# Patient Record
Sex: Female | Born: 1970 | Race: White | Hispanic: No | Smoking: Current every day smoker
Health system: Southern US, Community
[De-identification: ages and names within clinical notes are randomized; demographics above are authoritative.]

## PROBLEM LIST (undated history)

## (undated) ENCOUNTER — Emergency Department (HOSPITAL_COMMUNITY): Discharge status: Against Medical Advice | Payer: Medicaid Other

## (undated) DIAGNOSIS — K589 Irritable bowel syndrome without diarrhea: Secondary | ICD-10-CM

## (undated) DIAGNOSIS — M359 Systemic involvement of connective tissue, unspecified: Secondary | ICD-10-CM

## (undated) DIAGNOSIS — G8929 Other chronic pain: Secondary | ICD-10-CM

## (undated) DIAGNOSIS — I471 Supraventricular tachycardia, unspecified: Secondary | ICD-10-CM

## (undated) DIAGNOSIS — G40909 Epilepsy, unspecified, not intractable, without status epilepticus: Secondary | ICD-10-CM

## (undated) DIAGNOSIS — I509 Heart failure, unspecified: Secondary | ICD-10-CM

## (undated) DIAGNOSIS — F419 Anxiety disorder, unspecified: Secondary | ICD-10-CM

## (undated) DIAGNOSIS — E119 Type 2 diabetes mellitus without complications: Secondary | ICD-10-CM

## (undated) DIAGNOSIS — R079 Chest pain, unspecified: Secondary | ICD-10-CM

## (undated) DIAGNOSIS — F4310 Post-traumatic stress disorder, unspecified: Secondary | ICD-10-CM

## (undated) DIAGNOSIS — I10 Essential (primary) hypertension: Secondary | ICD-10-CM

## (undated) DIAGNOSIS — Z720 Tobacco use: Secondary | ICD-10-CM

## (undated) DIAGNOSIS — J449 Chronic obstructive pulmonary disease, unspecified: Secondary | ICD-10-CM

## (undated) DIAGNOSIS — Z9889 Other specified postprocedural states: Secondary | ICD-10-CM

## (undated) DIAGNOSIS — M545 Low back pain, unspecified: Secondary | ICD-10-CM

## (undated) DIAGNOSIS — R739 Hyperglycemia, unspecified: Secondary | ICD-10-CM

## (undated) DIAGNOSIS — R002 Palpitations: Secondary | ICD-10-CM

## (undated) DIAGNOSIS — R112 Nausea with vomiting, unspecified: Secondary | ICD-10-CM

## (undated) DIAGNOSIS — Z87828 Personal history of other (healed) physical injury and trauma: Secondary | ICD-10-CM

## (undated) DIAGNOSIS — J45909 Unspecified asthma, uncomplicated: Secondary | ICD-10-CM

## (undated) DIAGNOSIS — S0990XA Unspecified injury of head, initial encounter: Secondary | ICD-10-CM

## (undated) DIAGNOSIS — K5090 Crohn's disease, unspecified, without complications: Secondary | ICD-10-CM

## (undated) DIAGNOSIS — K219 Gastro-esophageal reflux disease without esophagitis: Secondary | ICD-10-CM

## (undated) HISTORY — DX: Type 2 diabetes mellitus without complications: E11.9

## (undated) HISTORY — DX: Irritable bowel syndrome, unspecified: K58.9

## (undated) HISTORY — DX: Low back pain, unspecified: M54.50

## (undated) HISTORY — DX: Gastro-esophageal reflux disease without esophagitis: K21.9

## (undated) HISTORY — DX: Chest pain, unspecified: R07.9

## (undated) HISTORY — DX: Supraventricular tachycardia, unspecified: I47.10

## (undated) HISTORY — PX: OOPHORECTOMY: SHX86

## (undated) HISTORY — DX: Anxiety disorder, unspecified: F41.9

## (undated) HISTORY — DX: Epilepsy, unspecified, not intractable, without status epilepticus: G40.909

## (undated) HISTORY — DX: Hyperglycemia, unspecified: R73.9

## (undated) HISTORY — DX: Crohn's disease, unspecified, without complications: K50.90

## (undated) HISTORY — PX: CHOLECYSTECTOMY: SHX55

## (undated) HISTORY — DX: Tobacco use: Z72.0

## (undated) HISTORY — DX: Low back pain: M54.5

## (undated) HISTORY — PX: ABDOMINAL HYSTERECTOMY: SHX81

## (undated) HISTORY — DX: Unspecified injury of head, initial encounter: S09.90XA

## (undated) HISTORY — DX: Palpitations: R00.2

## (undated) HISTORY — DX: Personal history of other (healed) physical injury and trauma: Z87.828

## (undated) HISTORY — DX: Low back pain, unspecified: G89.29

## (undated) HISTORY — DX: Chronic obstructive pulmonary disease, unspecified: J44.9

## (undated) HISTORY — DX: Supraventricular tachycardia: I47.1

---

## 2000-09-14 ENCOUNTER — Encounter: Admission: RE | Admit: 2000-09-14 | Discharge: 2000-09-14 | Payer: Self-pay | Admitting: Internal Medicine

## 2000-09-14 ENCOUNTER — Encounter: Payer: Self-pay | Admitting: Internal Medicine

## 2002-10-26 ENCOUNTER — Emergency Department (HOSPITAL_COMMUNITY): Admission: EM | Admit: 2002-10-26 | Discharge: 2002-10-26 | Payer: Self-pay | Admitting: Emergency Medicine

## 2002-12-24 ENCOUNTER — Emergency Department (HOSPITAL_COMMUNITY): Admission: EM | Admit: 2002-12-24 | Discharge: 2002-12-24 | Payer: Self-pay | Admitting: Emergency Medicine

## 2004-05-03 HISTORY — PX: APPENDECTOMY: SHX54

## 2004-05-25 ENCOUNTER — Emergency Department (HOSPITAL_COMMUNITY): Admission: EM | Admit: 2004-05-25 | Discharge: 2004-05-25 | Payer: Self-pay | Admitting: Emergency Medicine

## 2004-05-28 ENCOUNTER — Ambulatory Visit (HOSPITAL_COMMUNITY): Admission: RE | Admit: 2004-05-28 | Discharge: 2004-05-28 | Payer: Self-pay | Admitting: Pulmonary Disease

## 2004-06-22 ENCOUNTER — Ambulatory Visit (HOSPITAL_COMMUNITY): Admission: RE | Admit: 2004-06-22 | Discharge: 2004-06-22 | Payer: Self-pay | Admitting: Pulmonary Disease

## 2004-06-26 ENCOUNTER — Ambulatory Visit (HOSPITAL_COMMUNITY): Admission: RE | Admit: 2004-06-26 | Discharge: 2004-06-26 | Payer: Self-pay | Admitting: Pulmonary Disease

## 2004-08-11 ENCOUNTER — Emergency Department (HOSPITAL_COMMUNITY): Admission: EM | Admit: 2004-08-11 | Discharge: 2004-08-11 | Payer: Self-pay | Admitting: Emergency Medicine

## 2004-09-07 ENCOUNTER — Emergency Department (HOSPITAL_COMMUNITY): Admission: EM | Admit: 2004-09-07 | Discharge: 2004-09-07 | Payer: Self-pay | Admitting: Emergency Medicine

## 2004-09-16 ENCOUNTER — Inpatient Hospital Stay (HOSPITAL_COMMUNITY): Admission: AD | Admit: 2004-09-16 | Discharge: 2004-09-16 | Payer: Self-pay | Admitting: Obstetrics and Gynecology

## 2004-09-17 ENCOUNTER — Inpatient Hospital Stay (HOSPITAL_COMMUNITY): Admission: RE | Admit: 2004-09-17 | Discharge: 2004-09-18 | Payer: Self-pay | Admitting: Obstetrics & Gynecology

## 2004-09-17 ENCOUNTER — Encounter (INDEPENDENT_AMBULATORY_CARE_PROVIDER_SITE_OTHER): Payer: Self-pay | Admitting: *Deleted

## 2004-10-13 ENCOUNTER — Emergency Department (HOSPITAL_COMMUNITY): Admission: EM | Admit: 2004-10-13 | Discharge: 2004-10-13 | Payer: Self-pay | Admitting: Emergency Medicine

## 2004-11-02 ENCOUNTER — Emergency Department (HOSPITAL_COMMUNITY): Admission: EM | Admit: 2004-11-02 | Discharge: 2004-11-02 | Payer: Self-pay | Admitting: *Deleted

## 2004-11-14 ENCOUNTER — Emergency Department (HOSPITAL_COMMUNITY): Admission: EM | Admit: 2004-11-14 | Discharge: 2004-11-15 | Payer: Self-pay | Admitting: *Deleted

## 2004-11-26 ENCOUNTER — Encounter: Payer: Self-pay | Admitting: Obstetrics and Gynecology

## 2004-11-26 ENCOUNTER — Inpatient Hospital Stay (HOSPITAL_COMMUNITY): Admission: RE | Admit: 2004-11-26 | Discharge: 2004-11-28 | Payer: Self-pay | Admitting: Obstetrics and Gynecology

## 2004-12-22 ENCOUNTER — Emergency Department (HOSPITAL_COMMUNITY): Admission: EM | Admit: 2004-12-22 | Discharge: 2004-12-22 | Payer: Self-pay | Admitting: Emergency Medicine

## 2004-12-29 ENCOUNTER — Ambulatory Visit (HOSPITAL_COMMUNITY): Admission: RE | Admit: 2004-12-29 | Discharge: 2004-12-29 | Payer: Self-pay | Admitting: Obstetrics and Gynecology

## 2005-10-07 ENCOUNTER — Inpatient Hospital Stay (HOSPITAL_COMMUNITY): Admission: EM | Admit: 2005-10-07 | Discharge: 2005-10-09 | Payer: Self-pay | Admitting: Emergency Medicine

## 2005-10-08 ENCOUNTER — Ambulatory Visit: Payer: Self-pay | Admitting: Internal Medicine

## 2005-10-26 ENCOUNTER — Emergency Department (HOSPITAL_COMMUNITY): Admission: EM | Admit: 2005-10-26 | Discharge: 2005-10-26 | Payer: Self-pay | Admitting: *Deleted

## 2005-11-02 ENCOUNTER — Ambulatory Visit: Payer: Self-pay | Admitting: Gastroenterology

## 2005-11-11 ENCOUNTER — Ambulatory Visit: Payer: Self-pay | Admitting: Internal Medicine

## 2005-11-11 ENCOUNTER — Ambulatory Visit (HOSPITAL_COMMUNITY): Admission: RE | Admit: 2005-11-11 | Discharge: 2005-11-11 | Payer: Self-pay | Admitting: Internal Medicine

## 2006-01-14 ENCOUNTER — Ambulatory Visit: Payer: Self-pay | Admitting: Internal Medicine

## 2006-02-14 ENCOUNTER — Ambulatory Visit: Payer: Self-pay | Admitting: Internal Medicine

## 2006-03-19 ENCOUNTER — Emergency Department (HOSPITAL_COMMUNITY): Admission: EM | Admit: 2006-03-19 | Discharge: 2006-03-19 | Payer: Self-pay | Admitting: Emergency Medicine

## 2006-03-21 ENCOUNTER — Ambulatory Visit: Payer: Self-pay | Admitting: Internal Medicine

## 2006-04-04 ENCOUNTER — Ambulatory Visit (HOSPITAL_COMMUNITY): Admission: RE | Admit: 2006-04-04 | Discharge: 2006-04-04 | Payer: Self-pay | Admitting: Orthopaedic Surgery

## 2006-05-05 ENCOUNTER — Ambulatory Visit: Payer: Self-pay | Admitting: Internal Medicine

## 2006-05-16 ENCOUNTER — Emergency Department (HOSPITAL_COMMUNITY): Admission: EM | Admit: 2006-05-16 | Discharge: 2006-05-17 | Payer: Self-pay | Admitting: Emergency Medicine

## 2006-05-24 ENCOUNTER — Emergency Department (HOSPITAL_COMMUNITY): Admission: EM | Admit: 2006-05-24 | Discharge: 2006-05-24 | Payer: Self-pay | Admitting: Emergency Medicine

## 2006-06-08 ENCOUNTER — Emergency Department (HOSPITAL_COMMUNITY): Admission: EM | Admit: 2006-06-08 | Discharge: 2006-06-08 | Payer: Self-pay | Admitting: Emergency Medicine

## 2006-06-15 ENCOUNTER — Ambulatory Visit (HOSPITAL_COMMUNITY): Admission: RE | Admit: 2006-06-15 | Discharge: 2006-06-15 | Payer: Self-pay | Admitting: Pulmonary Disease

## 2006-06-19 ENCOUNTER — Emergency Department (HOSPITAL_COMMUNITY): Admission: EM | Admit: 2006-06-19 | Discharge: 2006-06-19 | Payer: Self-pay | Admitting: Emergency Medicine

## 2006-06-20 ENCOUNTER — Ambulatory Visit: Payer: Self-pay | Admitting: Internal Medicine

## 2006-07-04 ENCOUNTER — Encounter (HOSPITAL_COMMUNITY): Admission: RE | Admit: 2006-07-04 | Discharge: 2006-08-03 | Payer: Self-pay | Admitting: Pulmonary Disease

## 2006-09-19 ENCOUNTER — Ambulatory Visit (HOSPITAL_COMMUNITY): Admission: RE | Admit: 2006-09-19 | Discharge: 2006-09-19 | Payer: Self-pay | Admitting: Unknown Physician Specialty

## 2006-09-21 ENCOUNTER — Emergency Department (HOSPITAL_COMMUNITY): Admission: EM | Admit: 2006-09-21 | Discharge: 2006-09-21 | Payer: Self-pay | Admitting: Emergency Medicine

## 2006-10-25 ENCOUNTER — Ambulatory Visit: Payer: Self-pay | Admitting: Internal Medicine

## 2006-11-29 ENCOUNTER — Ambulatory Visit: Payer: Self-pay | Admitting: Internal Medicine

## 2006-12-29 ENCOUNTER — Ambulatory Visit: Payer: Self-pay | Admitting: Internal Medicine

## 2007-01-09 ENCOUNTER — Ambulatory Visit: Payer: Self-pay | Admitting: Internal Medicine

## 2007-01-09 ENCOUNTER — Ambulatory Visit (HOSPITAL_COMMUNITY): Admission: RE | Admit: 2007-01-09 | Discharge: 2007-01-09 | Payer: Self-pay | Admitting: Internal Medicine

## 2007-01-10 ENCOUNTER — Ambulatory Visit (HOSPITAL_COMMUNITY): Admission: RE | Admit: 2007-01-10 | Discharge: 2007-01-10 | Payer: Self-pay | Admitting: Internal Medicine

## 2007-03-10 ENCOUNTER — Encounter (HOSPITAL_COMMUNITY): Admission: RE | Admit: 2007-03-10 | Discharge: 2007-04-09 | Payer: Self-pay | Admitting: Internal Medicine

## 2007-03-17 ENCOUNTER — Ambulatory Visit (HOSPITAL_COMMUNITY): Admission: RE | Admit: 2007-03-17 | Discharge: 2007-03-17 | Payer: Self-pay | Admitting: General Surgery

## 2007-03-17 ENCOUNTER — Encounter (INDEPENDENT_AMBULATORY_CARE_PROVIDER_SITE_OTHER): Payer: Self-pay | Admitting: General Surgery

## 2007-05-09 ENCOUNTER — Emergency Department (HOSPITAL_COMMUNITY): Admission: EM | Admit: 2007-05-09 | Discharge: 2007-05-09 | Payer: Self-pay | Admitting: Emergency Medicine

## 2007-05-24 ENCOUNTER — Ambulatory Visit: Payer: Self-pay | Admitting: Cardiology

## 2007-05-30 ENCOUNTER — Ambulatory Visit (HOSPITAL_COMMUNITY): Admission: RE | Admit: 2007-05-30 | Discharge: 2007-05-30 | Payer: Self-pay | Admitting: Cardiology

## 2007-07-04 ENCOUNTER — Emergency Department (HOSPITAL_COMMUNITY): Admission: EM | Admit: 2007-07-04 | Discharge: 2007-07-04 | Payer: Self-pay | Admitting: Emergency Medicine

## 2007-11-27 ENCOUNTER — Emergency Department (HOSPITAL_COMMUNITY): Admission: EM | Admit: 2007-11-27 | Discharge: 2007-11-27 | Payer: Self-pay | Admitting: Emergency Medicine

## 2008-01-17 ENCOUNTER — Emergency Department (HOSPITAL_COMMUNITY): Admission: EM | Admit: 2008-01-17 | Discharge: 2008-01-17 | Payer: Self-pay | Admitting: Emergency Medicine

## 2008-02-08 ENCOUNTER — Ambulatory Visit (HOSPITAL_COMMUNITY): Admission: RE | Admit: 2008-02-08 | Discharge: 2008-02-08 | Payer: Self-pay | Admitting: Pulmonary Disease

## 2008-03-13 ENCOUNTER — Ambulatory Visit (HOSPITAL_COMMUNITY): Admission: RE | Admit: 2008-03-13 | Discharge: 2008-03-13 | Payer: Self-pay | Admitting: Pulmonary Disease

## 2008-05-03 HISTORY — PX: COLONOSCOPY: SHX174

## 2008-05-07 ENCOUNTER — Encounter (HOSPITAL_COMMUNITY): Admission: RE | Admit: 2008-05-07 | Discharge: 2008-06-06 | Payer: Self-pay | Admitting: Neurosurgery

## 2008-05-13 ENCOUNTER — Ambulatory Visit: Payer: Self-pay | Admitting: Internal Medicine

## 2008-05-24 ENCOUNTER — Ambulatory Visit: Payer: Self-pay | Admitting: Internal Medicine

## 2008-05-24 ENCOUNTER — Ambulatory Visit (HOSPITAL_COMMUNITY): Admission: RE | Admit: 2008-05-24 | Discharge: 2008-05-24 | Payer: Self-pay | Admitting: Internal Medicine

## 2008-06-17 ENCOUNTER — Ambulatory Visit (HOSPITAL_COMMUNITY): Admission: RE | Admit: 2008-06-17 | Discharge: 2008-06-17 | Payer: Self-pay | Admitting: Internal Medicine

## 2008-06-17 ENCOUNTER — Ambulatory Visit: Payer: Self-pay | Admitting: Internal Medicine

## 2008-06-17 ENCOUNTER — Encounter: Payer: Self-pay | Admitting: Gastroenterology

## 2008-06-17 LAB — CONVERTED CEMR LAB
AST: 17 units/L (ref 0–37)
Albumin: 4.4 g/dL (ref 3.5–5.2)
BUN: 9 mg/dL (ref 6–23)
Basophils Relative: 0 % (ref 0–1)
Calcium: 10.2 mg/dL (ref 8.4–10.5)
Chloride: 101 meq/L (ref 96–112)
Glucose, Bld: 95 mg/dL (ref 70–99)
Lymphs Abs: 1.6 10*3/uL (ref 0.7–4.0)
Monocytes Relative: 4 % (ref 3–12)
Neutro Abs: 6 10*3/uL (ref 1.7–7.7)
Neutrophils Relative %: 75 % (ref 43–77)
Platelets: 284 10*3/uL (ref 150–400)
Potassium: 3.9 meq/L (ref 3.5–5.3)
RBC: 4.53 M/uL (ref 3.87–5.11)
Sodium: 141 meq/L (ref 135–145)
Total Protein: 7 g/dL (ref 6.0–8.3)
WBC: 8 10*3/uL (ref 4.0–10.5)

## 2008-06-20 ENCOUNTER — Telehealth: Payer: Self-pay | Admitting: Gastroenterology

## 2008-06-21 LAB — CONVERTED CEMR LAB
Ketones, ur: NEGATIVE mg/dL
Protein, ur: NEGATIVE mg/dL
Urine Glucose: NEGATIVE mg/dL
pH: 6 (ref 5.0–8.0)

## 2008-07-03 ENCOUNTER — Ambulatory Visit: Payer: Self-pay | Admitting: Internal Medicine

## 2008-07-03 ENCOUNTER — Encounter: Payer: Self-pay | Admitting: Gastroenterology

## 2008-07-03 DIAGNOSIS — M545 Low back pain: Secondary | ICD-10-CM

## 2008-07-03 DIAGNOSIS — F411 Generalized anxiety disorder: Secondary | ICD-10-CM | POA: Insufficient documentation

## 2008-07-12 ENCOUNTER — Encounter: Payer: Self-pay | Admitting: Gastroenterology

## 2008-07-15 ENCOUNTER — Ambulatory Visit: Payer: Self-pay | Admitting: Cardiology

## 2008-07-15 ENCOUNTER — Encounter (INDEPENDENT_AMBULATORY_CARE_PROVIDER_SITE_OTHER): Payer: Self-pay

## 2008-07-15 LAB — CONVERTED CEMR LAB
Ketones, ur: NEGATIVE mg/dL
Leukocytes, UA: NEGATIVE
Nitrite: NEGATIVE
Specific Gravity, Urine: 1.022 (ref 1.005–1.030)
Urobilinogen, UA: 0.2 (ref 0.0–1.0)
pH: 6 (ref 5.0–8.0)

## 2008-07-18 ENCOUNTER — Encounter: Payer: Self-pay | Admitting: Urgent Care

## 2008-07-20 ENCOUNTER — Emergency Department (HOSPITAL_COMMUNITY): Admission: EM | Admit: 2008-07-20 | Discharge: 2008-07-20 | Payer: Self-pay | Admitting: Emergency Medicine

## 2008-08-27 ENCOUNTER — Ambulatory Visit (HOSPITAL_COMMUNITY): Admission: RE | Admit: 2008-08-27 | Discharge: 2008-08-27 | Payer: Self-pay | Admitting: Pulmonary Disease

## 2008-09-04 ENCOUNTER — Ambulatory Visit (HOSPITAL_COMMUNITY): Admission: RE | Admit: 2008-09-04 | Discharge: 2008-09-04 | Payer: Self-pay | Admitting: Pulmonary Disease

## 2008-09-14 ENCOUNTER — Emergency Department (HOSPITAL_COMMUNITY): Admission: EM | Admit: 2008-09-14 | Discharge: 2008-09-14 | Payer: Self-pay | Admitting: Emergency Medicine

## 2008-10-30 ENCOUNTER — Emergency Department (HOSPITAL_COMMUNITY): Admission: EM | Admit: 2008-10-30 | Discharge: 2008-10-30 | Payer: Self-pay | Admitting: Emergency Medicine

## 2008-11-25 ENCOUNTER — Emergency Department (HOSPITAL_COMMUNITY): Admission: EM | Admit: 2008-11-25 | Discharge: 2008-11-25 | Payer: Self-pay | Admitting: Emergency Medicine

## 2009-02-28 ENCOUNTER — Encounter: Payer: Self-pay | Admitting: Gastroenterology

## 2009-06-28 ENCOUNTER — Emergency Department (HOSPITAL_COMMUNITY): Admission: EM | Admit: 2009-06-28 | Discharge: 2009-06-28 | Payer: Self-pay | Admitting: Emergency Medicine

## 2009-07-29 ENCOUNTER — Emergency Department (HOSPITAL_COMMUNITY): Admission: EM | Admit: 2009-07-29 | Discharge: 2009-07-29 | Payer: Self-pay | Admitting: Emergency Medicine

## 2009-08-07 ENCOUNTER — Ambulatory Visit: Payer: Self-pay | Admitting: Cardiology

## 2009-09-16 ENCOUNTER — Encounter: Payer: Self-pay | Admitting: Cardiology

## 2009-12-16 ENCOUNTER — Ambulatory Visit: Payer: Self-pay | Admitting: Internal Medicine

## 2009-12-16 ENCOUNTER — Encounter: Payer: Self-pay | Admitting: Urgent Care

## 2009-12-24 ENCOUNTER — Encounter: Payer: Self-pay | Admitting: Urgent Care

## 2009-12-24 ENCOUNTER — Encounter: Payer: Self-pay | Admitting: *Deleted

## 2009-12-24 LAB — CONVERTED CEMR LAB
Alkaline Phosphatase: 103 units/L
Basophils Absolute: 0 10*3/uL
CO2: 29 meq/L
Creatinine, Ser: 0.85 mg/dL
Eosinophils Absolute: 0.2 10*3/uL
Eosinophils Relative: 2 %
HCT: 47.1 %
Hemoglobin: 15.4 g/dL
Monocytes Absolute: 0.6 10*3/uL
Platelets: 344 10*3/uL
RBC: 4.98 M/uL
Total Protein: 7.3 g/dL
WBC: 10.5 10*3/uL

## 2010-01-09 ENCOUNTER — Encounter: Payer: Self-pay | Admitting: Internal Medicine

## 2010-01-23 ENCOUNTER — Encounter: Payer: Self-pay | Admitting: Internal Medicine

## 2010-01-26 LAB — CONVERTED CEMR LAB
AST: 19 units/L (ref 0–37)
Albumin: 4.8 g/dL (ref 3.5–5.2)
BUN: 5 mg/dL — ABNORMAL LOW (ref 6–23)
Basophils Relative: 0 % (ref 0–1)
CO2: 29 meq/L (ref 19–32)
Calcium: 10 mg/dL (ref 8.4–10.5)
Chloride: 99 meq/L (ref 96–112)
Creatinine, Ser: 0.85 mg/dL (ref 0.40–1.20)
Hemoglobin, Urine: NEGATIVE
Hemoglobin: 15.4 g/dL — ABNORMAL HIGH (ref 12.0–15.0)
Ketones, ur: NEGATIVE mg/dL
Lymphocytes Relative: 19 % (ref 12–46)
Lymphs Abs: 2.1 10*3/uL (ref 0.7–4.0)
MCHC: 32.7 g/dL (ref 30.0–36.0)
Monocytes Absolute: 0.6 10*3/uL (ref 0.1–1.0)
Monocytes Relative: 6 % (ref 3–12)
Neutro Abs: 7.7 10*3/uL (ref 1.7–7.7)
Neutrophils Relative %: 73 % (ref 43–77)
Nitrite: NEGATIVE
Potassium: 4.4 meq/L (ref 3.5–5.3)
RBC: 4.98 M/uL (ref 3.87–5.11)
Specific Gravity, Urine: 1.01 (ref 1.005–1.030)
Urobilinogen, UA: 0.2 (ref 0.0–1.0)
WBC: 10.5 10*3/uL (ref 4.0–10.5)
pH: 6 (ref 5.0–8.0)

## 2010-02-25 ENCOUNTER — Encounter: Payer: Self-pay | Admitting: Internal Medicine

## 2010-04-18 ENCOUNTER — Emergency Department (HOSPITAL_COMMUNITY)
Admission: EM | Admit: 2010-04-18 | Discharge: 2010-04-18 | Payer: Self-pay | Source: Home / Self Care | Admitting: Emergency Medicine

## 2010-05-24 ENCOUNTER — Encounter: Payer: Self-pay | Admitting: Internal Medicine

## 2010-06-02 NOTE — Miscellaneous (Signed)
Summary: Orders Update  Clinical Lists Changes  Orders: Added new Test order of T-CBC w/Diff [phone]) - Signed Added new Test order of T-Comprehensive Metabolic Panel ([id number]) - Signed Added new Test order of T-Urinalysis ([id number]) - Signed Added new Test order of T-Culture, C-Diff Toxin A/B ([id number]) - Signed Added new Test order of T-Culture, Stool ([id number]) - Signed

## 2010-06-02 NOTE — Assessment & Plan Note (Signed)
Summary: ov w/RMR,abd pain,n/v,hx crohn's/ss  Patient left the office without being seen.  Visit Type:  Follow-up Visit Primary Care Arieon Corcoran:  Luan Pulling  Chief Complaint:  F/U abd pain/n/v/diarrhea.  History of Present Illness:  Pt says she has diarrhea every day 10-15 times daily. Has nausea at least three times weekly.  Current Medications (verified): 1)  Alprazolam 1 Mg Tbdp (Alprazolam) .... One By Mouth Qid 2)  Flexeril .... Prn 3)  Nexium 40 Mg Cpdr (Esomeprazole Magnesium) .... Take 1 Tablet By Mouth Once A Day 4)  Hyomax-Sl 0.125 Mg Subl (Hyoscyamine Sulfate) .... One By Mouth Ac/hs (Up To Qid) As Needed Diarrhea/abd Pain 5)  Zofran Odt 4 Mg Tbdp (Ondansetron) .Marland Kitchen.. 1 By Mouth Q 6-8 Hrs As Needed N/v  Allergies (verified): 1)  ! Penicillin 2)  ! Codeine 3)  ! * Latex 4)  Prilosec  Past History:  Past Medical History: Last updated: 07/03/2008 Anxiety Disorder GERD Irritable Bowel Syndrome Chronic low back pain H/o colon polyps in 2002 at [facility] per patient EGD 9/08 by Dr. Gala Romney, small hiatal hernia, s/p 32 french maloney dilator for h/o dysphagia Colonoscopy 2007 by Dr. 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  1/2 ppd Alcohol Use - no Illicit Drug Use - no  Risk Factors: Smoking Status: current (07/03/2008)  Vital Signs:  Patient profile:   40 year old female Height:      66.5 inches Weight:      145 pounds BMI:     23.14 Temp:      97.7 degrees F oral Pulse rate:   60 / minute BP sitting:   118 / 80  (left arm) Cuff size:   regular  Vitals Entered By: Waldon Merl LPN (February 26, 9976 10:03 AM)

## 2010-06-02 NOTE — Procedures (Signed)
Summary: [facility] 08/2009  [facility] 08/2009   Imported By: Nevada Crane 09/16/2009 16:46:27  _____________________________________________________________________  External Attachment:    Type:   Image     Comment:   External Document

## 2010-06-02 NOTE — Assessment & Plan Note (Signed)
Summary: abd pain,hx of polyps/ss   Visit Type:  Follow-up Visit Primary Care Evren Shankland:  Luan Pulling  Chief Complaint:  abdominal pain.  History of Present Illness: Pt here for abd pain, diarrhea.  Hx IBS.  Diarrhea 10-15 times per day w/ abd pain right lower quad & swelling.  10/10 pain intermittant.  c/o dysuria.  c/o nausea/vomiting all day.  Last emesis yesterday.  c/o small volume dark hematochezia.  Denies fever or chills.  c/o "hot feeling in face & feels like BP may be up".  Changed from Librax to hyomax last fall due to insurance coverage.  No ill contacts, foreign travel, new pets.  Well water.  No recent abx or new meds.  c/o "nerves"  going through divorce right now.  Does not think this is her usual IBS.  Questions whether she needs CT.  Has had benign CT A/P anually for similar symptoms per review of medical records.  States she needs colonoscopies q 6 months given FH of CRCA and adenomatous polyp found at [facility] several yrs ago.    Current Problems (verified): 1)  Dysuria  (ICD-788.1) 2)  Diarrhea, Bloody  (ICD-787.91) 3)  Abdominal Pain  (ICD-789.00) 4)  Anxiety State, Unspecified  (ICD-300.00) 5)  Gastroesophageal Reflux Disease, Chronic  (ICD-530.81) 6)  Back Pain, Lumbar, Chronic  (ICD-724.2) 7)  Uti's, Hx of  (ICD-V13.00) 8)  Hx of Microscopic Hematuria  (ICD-599.72) 9)  Irritable Bowel Syndrome  (ICD-564.1)  Current Medications (verified): 1)  Alprazolam 1 Mg Tbdp (Alprazolam) .... One By Mouth Qid 2)  Flexeril .... Prn 3)  Nexium 40 Mg Cpdr (Esomeprazole Magnesium) .... One By Mouth Bid 4)  Hyomax-Sl 0.125 Mg Subl (Hyoscyamine Sulfate) .... One By Mouth Ac/hs (Up To Qid) As Needed Diarrhea/abd Pain 5)  Librax 2.5-5 Mg Caps (Clidinium-Chlordiazepoxide) .Marland Kitchen.. 1 By Mouth Ac/hs (Up To Qid) For Abd Pain/diarrhea 6)  Anusol-Hc 25 Mg Supp (Hydrocortisone Acetate) .Marland Kitchen.. 1 Pr Two Times A Day X 10 Days 7)  Zofran Odt 4 Mg Tbdp (Ondansetron) .Marland Kitchen.. 1 By Mouth Q 6-8 Hrs As Needed  N/v  Allergies (verified): 1)  ! Penicillin 2)  ! Codeine 3)  ! * Latex 4)  Prilosec  Family History: Reviewed history from 07/03/2008 and no changes required. 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  1/2 ppd Alcohol Use - no Illicit Drug Use - no Drug Use:  no  Review of Systems General:  See HPI; Complains of fatigue, weakness, and malaise. CV:  Denies chest pains, angina, palpitations, syncope, dyspnea on exertion, orthopnea, PND, peripheral edema, and claudication. Resp:  Denies dyspnea at rest, dyspnea with exercise, cough, sputum, wheezing, coughing up blood, and pleurisy. GI:  See HPI; Denies jaundice. GU:  Complains of urinary frequency. Derm:  Denies rash, itching, dry skin, hives, moles, warts, and unhealing ulcers.  Vital Signs:  Patient profile:   40 year old female Height:      66.5 inches Weight:      141.50 pounds BMI:     22.58 Temp:     98.2 degrees F oral Pulse rate:   72 / minute BP sitting:   118 / 82  (left arm) Cuff size:   regular  Vitals Entered By: Waldon Merl LPN (December 16, 1636 11:05 AM)  Physical Exam  General:  Well developed, well nourished, no acute distress. Head:  Normocephalic and atraumatic. Eyes:  Sclera clear, non-icteric Mouth:  OP moist, pink Neck:  Supple; no masses or thyromegaly. Heart:  Regular rate and rhythm; no murmurs, rubs,  or bruits. Abdomen:  normal bowel sounds, without guarding, without rebound, no hernia, no masses, and no hepatomegally or splenomegaly.  Moderate tenderness lower abd & around umbilicus on palpation. Msk:  Symmetrical with no gross deformities. Normal posture. Extremities:  No clubbing, cyanosis, edema or deformities noted. Neurologic:  Alert and   oriented x4;  grossly normal neurologically. Skin:  Intact without significant lesions or rashes. Psych:  Alert, oriented.  Impression & Recommendations:  Problem # 1:  ABDOMINAL PAIN (ICD-789.00) 40 y/o caucasian female w/ hx IBS and benign anorectal bleeding who presents w/ abd pain, nausea, vomiting and profuse bloody diarrhea.  No hx inflammatory bowel disease.  Extensive previous work-up benign.  I suspect flare of Irritable bowel syndrome w/ benign anorectal bleeding, but do need to r/o infectious colitis and c-difficile.  She had previously done well on Librax & I recommended she return to this.     UA, CBC, CMP, c diff stool, C/S stool,   Problem # 2:  DIARRHEA, BLOODY (ICD-787.91) See #1  Problem # 3:  GASTROESOPHAGEAL REFLUX DISEASE, CHRONIC (ICD-530.81) Stable  Orders: Est. Patient Level III (70962)  Problem # 4:  DYSURIA (ICD-788.1) Hx frequent UTIs.  Will recheck UA.  Other Orders: T-CBC w/Diff [id number]) T-Comprehensive Metabolic Panel ([id number]) T-Urinalysis [id number]) T-Culture, C-Diff Toxin A/B ([id number]) T-Culture, Stool ([id number])  Patient Instructions: 1)  BEGIN RESTORA ONCE DAILY SAMPLES GIVEN 2)  BEGIN ANUSOL SUPP FOR BLEEDING 3)  BEGIN ZOFRAN AS NEEDED FOR NAUSEA 4)  TO ER IF SEVERE PAIN OR YOU BECOME DEHYDRATED 5)  GO GET LABS NOW  Prescriptions: ZOFRAN ODT 4 MG TBDP (ONDANSETRON) 1 by mouth Q 6-8 HRS as needed N/V  #30 x 0   Entered and Authorized by:   Andria Meuse FNP-BC   Signed by:   Andria Meuse FNP-BC on 12/16/2009   Method used:   Electronically to        [facility] (retail)       [address]       [place], [place]  [address]       Ph: [phone]       Fax: [phone]   RxID:   [id number] ANUSOL-HC 25 MG SUPP (HYDROCORTISONE ACETATE) 1 PR two times a day X 10 DAYS  #20 x 0   Entered and Authorized by:   Andria Meuse FNP-BC   Signed by:   Andria Meuse  FNP-BC on 12/16/2009   Method used:   Electronically to        [facility] (retail)       [address]       [place], [place]  [address]       Ph: [phone]       Fax: [phone]   RxID:   [id number] LIBRAX 2.5-5 MG CAPS (CLIDINIUM-CHLORDIAZEPOXIDE) 1 by mouth ac/hs (up to QID) for abd pain/diarrhea  #90 x 0   Entered and Authorized by:   Andria Meuse FNP-BC   Signed by:   Andria Meuse FNP-BC on 12/16/2009   Method used:   Electronically to        [facility] (retail)  [place], [place]  [address]       Ph: [phone]       Fax: [phone]   RxID:   [id number]   Appended Document: abd pain,hx of polyps/ss Did pt get labs done?  Appended Document: abd pain,hx of polyps/ss Pt not done labs/stool tests yet. Said she hasn't had a BM since she was here, no longer diarrhea. I told her she could go and get the blood work done,  even if she has to wait for the stool tests and  she said she would.  Please advise!  Appended Document: abd pain,hx of polyps/ss Agree

## 2010-06-02 NOTE — Letter (Signed)
Summary: [facility]ERMIN[facility]  DISABILITY DETERMINATION   Imported By: Hoy Morn 01/23/2010 12:11:38  _____________________________________________________________________  External Attachment:    Type:   Image     Comment:   External Document

## 2010-06-02 NOTE — Letter (Signed)
Summary: DISABILITY DETERMINATION  DISABILITY DETERMINATION   Imported By: Hoy Morn 01/09/2010 14:11:03  _____________________________________________________________________  External Attachment:    Type:   Image     Comment:   External Document

## 2010-07-02 ENCOUNTER — Encounter: Payer: Self-pay | Admitting: Cardiology

## 2010-07-02 LAB — CONVERTED CEMR LAB
Albumin: 4.1 g/dL
CO2: 24 meq/L
Glucose, Bld: 64 mg/dL
Hemoglobin: 13.7 g/dL
Potassium: 4.4 meq/L
Sodium: 141 meq/L
Total Protein: 6.7 g/dL
WBC: 7.4 10*3/uL

## 2010-07-07 ENCOUNTER — Ambulatory Visit: Payer: Self-pay | Admitting: Cardiology

## 2010-07-07 ENCOUNTER — Encounter (INDEPENDENT_AMBULATORY_CARE_PROVIDER_SITE_OTHER): Payer: Self-pay | Admitting: *Deleted

## 2010-07-08 ENCOUNTER — Encounter: Payer: Self-pay | Admitting: Cardiology

## 2010-07-13 ENCOUNTER — Ambulatory Visit (INDEPENDENT_AMBULATORY_CARE_PROVIDER_SITE_OTHER): Payer: Medicaid Other | Admitting: Cardiology

## 2010-07-13 ENCOUNTER — Encounter: Payer: Self-pay | Admitting: *Deleted

## 2010-07-13 ENCOUNTER — Encounter: Payer: Self-pay | Admitting: Cardiology

## 2010-07-13 DIAGNOSIS — R002 Palpitations: Secondary | ICD-10-CM

## 2010-07-13 DIAGNOSIS — F17200 Nicotine dependence, unspecified, uncomplicated: Secondary | ICD-10-CM | POA: Insufficient documentation

## 2010-07-13 DIAGNOSIS — R079 Chest pain, unspecified: Secondary | ICD-10-CM

## 2010-07-13 DIAGNOSIS — R0781 Pleurodynia: Secondary | ICD-10-CM | POA: Insufficient documentation

## 2010-07-13 LAB — BASIC METABOLIC PANEL
CO2: 27 mEq/L (ref 19–32)
Chloride: 103 mEq/L (ref 96–112)
GFR calc Af Amer: >60 mL/min (ref >60)
Potassium: 3.7 mEq/L (ref 3.5–5.1)

## 2010-07-13 LAB — DIFFERENTIAL
Eosinophils Relative: 2 % (ref 0–5)
Lymphocytes Relative: 25 % (ref 12–46)
Lymphs Abs: 2.1 10*3/uL (ref 0.7–4.0)
Monocytes Absolute: 0.4 10*3/uL (ref 0.1–1.0)
Monocytes Relative: 5 % (ref 3–12)

## 2010-07-13 LAB — CBC
HCT: 40.1 % (ref 36.0–46.0)
Hemoglobin: 14.3 g/dL (ref 12.0–15.0)
MCH: 31.7 pg (ref 26.0–34.0)
MCV: 88.9 fL (ref 78.0–100.0)
RBC: 4.51 MIL/uL (ref 3.87–5.11)
WBC: 8.4 10*3/uL (ref 4.0–10.5)

## 2010-07-13 LAB — POCT CARDIAC MARKERS: Troponin i, poc: <0.05 ng/mL (ref 0.00–0.09)

## 2010-07-14 NOTE — Letter (Signed)
Summary: Appointment - Missed  [facility] [place]. [address] [place] [address]   Phone: [phone]  Fax: [phone]     July 07, 2010 MRN: [id number]   [facility] [address] [place], [place]  [address]   Dear Ms. CAIN,  Our records indicate you missed your appointment on        07/07/10                with Dr.   Domenic Polite    .                                    It is very important that we reach you to reschedule this appointment. We look forward to participating in your health care needs. Please contact us at the number listed above at your earliest convenience to reschedule this appointment.     Sincerely,    Public relations account executive

## 2010-07-14 NOTE — Letter (Signed)
Summary: DR Luan Pulling OFFICE NOTE  DR Luan Pulling OFFICE NOTE   Imported By: Nevada Crane 07/08/2010 12:00:50  _____________________________________________________________________  External Attachment:    Type:   Image     Comment:   External Document

## 2010-07-21 NOTE — Miscellaneous (Signed)
Summary: labs cbcd,cmp,12/24/2009  Clinical Lists Changes  Observations: Added new observation of CALCIUM: 10.0 mg/dL (12/24/2009 8:11) Added new observation of ALBUMIN: 4.8 g/dL (12/24/2009 8:11) Added new observation of PROTEIN, TOT: 7.3 g/dL (12/24/2009 8:11) Added new observation of SGPT (ALT): 15 units/L (12/24/2009 8:11) Added new observation of SGOT (AST): 19 units/L (12/24/2009 8:11) Added new observation of ALK PHOS: 103 units/L (12/24/2009 8:11) Added new observation of CREATININE: 0.85 mg/dL (12/24/2009 8:11) Added new observation of BUN: 5 mg/dL (12/24/2009 8:11) Added new observation of BG RANDOM: 81 mg/dL (12/24/2009 8:11) Added new observation of CO2 PLSM/SER: 29 meq/L (12/24/2009 8:11) Added new observation of CL SERUM: 99 meq/L (12/24/2009 8:11) Added new observation of K SERUM: 4.4 meq/L (12/24/2009 8:11) Added new observation of NA: 142 meq/L (12/24/2009 8:11) Added new observation of ABSOLUTE BAS: 0.0 K/uL (12/24/2009 8:11) Added new observation of BASOPHIL %: 0 % (12/24/2009 8:11) Added new observation of EOS ABSLT: 0.2 K/uL (12/24/2009 8:11) Added new observation of % EOS AUTO: 2 % (12/24/2009 8:11) Added new observation of ABSOLUTE MON: 0.6 K/uL (12/24/2009 8:11) Added new observation of MONOCYTE %: 6 % (12/24/2009 8:11) Added new observation of ABS LYMPHOCY: 2.1 K/uL (12/24/2009 8:11) Added new observation of PLATELETK/UL: 344 K/uL (12/24/2009 8:11) Added new observation of RDW: 13.1 % (12/24/2009 8:11) Added new observation of MCHC RBC: 32.7 g/dL (12/24/2009 8:11) Added new observation of MCV: 94.6 fL (12/24/2009 8:11) Added new observation of HCT: 47.1 % (12/24/2009 8:11) Added new observation of HGB: 15.4 g/dL (12/24/2009 8:11) Added new observation of RBC M/UL: 4.98 M/uL (12/24/2009 8:11) Added new observation of WBC COUNT: 10.5 10*3/microliter (12/24/2009 8:11)

## 2010-07-21 NOTE — Miscellaneous (Signed)
Summary: chest xray 06/19/2009  Clinical Lists Changes  Observations: Added new observation of CXR RESULTS:   Clinical Data: Chest pain.    CHEST - 1 VIEW    Comparison: 09/14/2008    Findings: Heart and mediastinal contours are within normal limits.   No focal opacities or effusions.  No acute bony abnormality.    IMPRESSION:   No active disease.    Read By:  Rolm Baptise,  M.D.   Released By:  Rolm Baptise,  M.D.  Additional Information  HL7 RESULT STATUS : F  External IF Update Timestamp : 2010-04-18:18:52:12.000000  (04/18/2010 8:07)      CXR  Procedure date:  04/18/2010  Findings:        Clinical Data: Chest pain.    CHEST - 1 VIEW    Comparison: 09/14/2008    Findings: Heart and mediastinal contours are within normal limits.   No focal opacities or effusions.  No acute bony abnormality.    IMPRESSION:   No active disease.    Read By:  Rolm Baptise,  M.D.   Released By:  Rolm Baptise,  M.D.  Additional Information  HL7 RESULT STATUS : F  External IF Update Timestamp : 2010-04-18:18:52:12.000000

## 2010-07-21 NOTE — Assessment & Plan Note (Signed)
Summary: [facility]   Visit Type:  Follow-up Primary Provider:  Dr. Sinda Du   History of Present Illness: [name] is seen in the office today at the request of Dr. Luan Pulling for evaluation of palpitations and chest discomfort.  This nice young woman was initially evaluated 3 years ago for similar complaints.  Event recording demonstrated no significant arrhythmias.  An echocardiogram showed no structural heart disease.  She did well until the past month when she has noted frequent episodes of palpitations with heart rate as high as 146 by her count.  She associates these with anxiety but not with exertion.  Symptoms persist for hours and resolve spontaneously.  She reports mild mid substernal chest discomfort unassociated with exertion, with no radiation and with no additional symptoms.  She is unaware of anything that exacerbates or ameliorates this discomfort. Lifestyle is fairly sedentary, but she does her housework and plays golf using an Web designer.  She notes fatigue intermittently interfering with completing her household chores.  She also complains of impaired short and mid term memory.  She suffered a closed head injury in the past.  She has had paresthesias of the upper extremities and lower extremities, but denies other focal neurologic signs.  Consultation with a neurologist is planned.  She continues to smoke cigarettes at the rate of one half pack per day with a total consumption of approximately 25 pack years.  Office records obtained from patient's primary care physician and reviewed.  Current Medications (verified): 1)  Alprazolam 1 Mg Tbdp (Alprazolam) .... One By Mouth Qid 2)  Flexeril .... Prn 3)  Nexium 40 Mg Cpdr (Esomeprazole Magnesium) .... Take 1 Tablet By Mouth Once A Day 4)  Hyomax-Sl 0.125 Mg Subl (Hyoscyamine Sulfate) .... One By Mouth Ac/hs (Up To Qid) As Needed Diarrhea/abd Pain 5)  Aleve 220 Mg Tabs (Naproxen Sodium) .... As  Needed  Allergies (verified): 1)  ! Penicillin 2)  ! Codeine 3)  ! * Latex 4)  Prilosec  Comments:  Nurse/Medical Assistant: no meds no list we reviewed from previous list she uses [facility] in [place]  Past History:  Family History: Last updated: Aug 02, 2010 Father: died age 24 with cardiac disease; h/o Ca colon; lymphoma Mother: died age 70 with Ca of the lung;  h/o CAD &  HTN Siblings:N=3; no chronic medical conditions  Cousin:   Crohn's disease  Social History: Last updated: 08/02/10 Marital Status: divorced, lives w/ daughter, who is an only child. 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  1/2 ppd Alcohol Use - no Illicit Drug Use - no  Past Medical History: Chest pain: normal stress echocardiogram in 05/2007 Palpitations with tachycardia Tobacco abuse:25-pack-years; 1/2 pack per day Fasting hyperglycemia Anxiety Disorder Chronic low back pain _______________GI_______________________ Gastroesophageal reflux disease Irritable Bowel Syndrome; chronic constipation H/o colon polyps in 2002 at [facility] per patient;  EGD 9/08 by Dr. Gala Romney, small hiatal hernia, s/p 60 french maloney dilator for h/o dysphagia Colonoscopy 2007 by Dr. Gala Romney, internal hemorrhoids, anal papillae, normal terminal ileum _________________________________________________________________________  Past Surgical History: Appendectomy 2006 Cholecystectomy Hysterectomy with right salpingo-oophorectomy-2005 Left salpingo-oophorectomy for torsion and left ovarian fibroma; uterine myoma resected-1995 C-section 1996 Colonoscopy-negative except for hemorrhoids in 2010  Family History: Father: died age 9 with cardiac disease; h/o Ca colon; lymphoma Mother: died age 24 with Ca of the lung;  h/o CAD &  HTN Siblings:N=3; no chronic medical conditions  Cousin:   Crohn's disease  Social History: Marital Status: divorced, lives  w/ daughter, who is an only  child. 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  1/2 ppd Alcohol Use - no Illicit Drug Use - no  Event Monitor  Procedure date:  09/11/2009  Findings:      Normal sinus rhythm and sinus tachycardia No arrhythmias Multiple symptomatic spells with chest pain, dizziness and palpitations with sinus tachycardia present  CT Brain  Procedure date:  07/29/2009  Findings:         Comparison: 10/30/2008    Findings:   Normal ventricular morphology.   No midline shift or mass effect.   Normal appearance of brain parenchyma.   No intracranial hemorrhage, mass lesion, or acute infarction.   Visualized paranasal sinuses and mastoid air cells clear.   Bones unremarkable.    IMPRESSION:   No acute intracranial abnormalities.    Read By:  Burnetta Sabin,  M.D.  CT Scan  Procedure date:  06/17/2008  Findings:       Findings:  The lung bases are clear.  The liver, spleen, pancreas,   adrenal glands and kidneys are unremarkable and stable in   appearance.  The stomach, duodenum, small bowel and colon are   unremarkable.    The the patient has had a cholecystectomy.  No common bile duct   dilatation.  The aorta is normal in caliber.  The major branch   vessels are normal.  No mesenteric or retroperitoneal masses or   adenopathy.    No significant bony findings.    IMPRESSION:   Unremarkable CT examination of the abdomen.   CT PELVIS    Findings:  The rectum, sigmoid colon visualized small bowel loops   are unremarkable.  The patient has had a hysterectomy.  The patient   has also had a appendectomy.  No pelvic masses, adenopathy or free   pelvic fluid collections.  The bony pelvis is intact.  No inguinal   mass or hernia.    IMPRESSION:   No acute pelvic findings, masses or adenopathy.    Read By:  Liliana Cline,  M.D.   EKG  Procedure date:  07/13/2010  Findings:      Sinus tachycardia at a rate of 113 BPM Left atrial  enlargement Right ventricular conduction delay A right axis deviation-possible RVH Low-voltage QRS-chest leads Delayed R-wave progression No change when compared to a previous tracing of 04/18/2010   Review of Systems       Fairly frequent headaches; told of heart murmur in the past; history of GI problems the including peptic ulcer disease that is now fairly well-controlled; intermittent mild pedal edema.  All other systems reviewed and are negative.  Vital Signs:  Patient profile:   40 year old female Weight:      144 pounds BMI:     22.98 Pulse rate:   120 / minute BP sitting:   129 / 82  (left arm)  Vitals Entered By: Doretha Sou, CNA (July 13, 2010 11:13 AM)  Physical Exam  General:  Proportionment weight and height; well-developed; no acute distress: HEENT-Hoarse voice; dry oral mucosa; [place]/AT; PERRL; EOM intact; conjunctiva and lids nl:  Neck-No JVD; no carotid bruits: Endocrine-No thyromegaly: Lungs-No tachypnea, clear without rales, rhonchi or wheezes: CV-normal PMI; normal S1 and S2;heart rate increased from an initial value of 100 to 120 during the examination  Abdomen-BS normal; soft and non-tender without masses or organomegaly; voluntary guarding noted MS-No deformities, cyanosis or clubbing: Neurologic-Nl cranial nerves; symmetric strength and tone:  Skin- Warm, no sig. lesions: Extremities-Nl distal pulses; no edema    Impression & Recommendations:  Problem # 1:  CHEST PAIN (ICD-786.50) Chest discomfort is atypical and very mild; a stress echocardiogram will be performed to further evaluate.  Likelihood of coronary artery disease is low despite surgical menopause 6 years ago and tobacco use.  She has a history of borderline diabetes and is unaware of any prior lipid determinations.  Problem # 2:  PALPITATIONS (ICD-785.1) Symptoms are occurring fairly frequently and should be easily identified with an event recorder.  Most likely etiology is anxiety, which  has been a significant problem for the patient in the past.  She is taking high dose benzodiazepines.  The danger of discontinuing this medication suddenly was discussed with her.  Problem # 3:  TOBACCO ABUSE (ICD-305.1) Patient stopped smoking throughout her pregnancy more than a decade ago.  I explained that doing so again and permanently would be highly desirable.  Other Orders: Stress Echo (Stress Echo)  Patient Instructions: 1)  Your physician recommends that you schedule a follow-up appointment in: 3 WEEKS 2)  Your physician has recommended that you wear an event monitor.  Event monitors are medical devices that record the heart's electrical activity. Doctors most often use these monitors to diagnose arrhythmias. Arrhythmias are problems with the speed or rhythm of the heartbeat. The monitor is a small, portable device. You can wear one while you do your normal daily activities. This is usually used to diagnose what is causing palpitations/syncope (passing out).

## 2010-07-26 LAB — CBC
HCT: 37.7 % (ref 36.0–46.0)
MCV: 91.9 fL (ref 78.0–100.0)
Platelets: 255 10*3/uL (ref 150–400)
WBC: 5.7 10*3/uL (ref 4.0–10.5)

## 2010-07-26 LAB — RAPID URINE DRUG SCREEN, HOSP PERFORMED
Benzodiazepines: POSITIVE — AB
Cocaine: NOT DETECTED
Tetrahydrocannabinol: POSITIVE — AB

## 2010-07-26 LAB — DIFFERENTIAL
Eosinophils Absolute: 0.2 10*3/uL (ref 0.0–0.7)
Eosinophils Relative: 4 % (ref 0–5)
Lymphs Abs: 1.5 10*3/uL (ref 0.7–4.0)
Monocytes Relative: 5 % (ref 3–12)

## 2010-07-26 LAB — BASIC METABOLIC PANEL
BUN: 7 mg/dL (ref 6–23)
Chloride: 106 mEq/L (ref 96–112)
Potassium: 4.4 mEq/L (ref 3.5–5.1)

## 2010-07-26 LAB — ETHANOL: Alcohol, Ethyl (B): <5 mg/dL (ref 0–10)

## 2010-07-29 ENCOUNTER — Telehealth: Payer: Self-pay | Admitting: *Deleted

## 2010-07-29 DIAGNOSIS — I471 Supraventricular tachycardia: Secondary | ICD-10-CM

## 2010-07-29 DIAGNOSIS — R002 Palpitations: Secondary | ICD-10-CM

## 2010-07-29 MED ORDER — METOPROLOL SUCCINATE ER 50 MG PO TB24: 50.0000 mg | ORAL_TABLET | Freq: Every day | ORAL | Status: DC

## 2010-07-29 NOTE — Telephone Encounter (Addendum)
S: pt has a 21 event monitor B: cardionet called with a urgent hr 166-190 sustained for several minutes A: I called pt, c/o fluttering in her chest and dizziness R: I showed faxed tracing to Dr. Lattie Haw.  He prescribed toprol xl 33m daily.      I spoke with pt and called rx into [facility]  08/03/2010  Agree with plan as outlined.  On further review of tracings it is not entirely clear that this is other than sinus rhythm; however, we will proceed with pharmacologic Rx with a beta blocker for symptom relief.

## 2010-07-31 ENCOUNTER — Ambulatory Visit (HOSPITAL_COMMUNITY)
Admission: RE | Admit: 2010-07-31 | Discharge: 2010-07-31 | Disposition: A | Discharge status: Home / Self Care | Payer: Medicaid Other | Source: Ambulatory Visit | Attending: Cardiology | Admitting: Cardiology

## 2010-07-31 ENCOUNTER — Encounter (HOSPITAL_COMMUNITY): Payer: Medicaid Other | Attending: Cardiology

## 2010-07-31 DIAGNOSIS — R079 Chest pain, unspecified: Secondary | ICD-10-CM | POA: Insufficient documentation

## 2010-07-31 DIAGNOSIS — R002 Palpitations: Secondary | ICD-10-CM | POA: Insufficient documentation

## 2010-08-05 ENCOUNTER — Other Ambulatory Visit (HOSPITAL_COMMUNITY): Payer: Self-pay | Admitting: Pulmonary Disease

## 2010-08-05 DIAGNOSIS — R413 Other amnesia: Secondary | ICD-10-CM

## 2010-08-06 ENCOUNTER — Ambulatory Visit (INDEPENDENT_AMBULATORY_CARE_PROVIDER_SITE_OTHER): Payer: Medicaid Other | Admitting: Physician Assistant

## 2010-08-06 ENCOUNTER — Encounter: Payer: Self-pay | Admitting: Physician Assistant

## 2010-08-06 ENCOUNTER — Inpatient Hospital Stay (HOSPITAL_COMMUNITY): Admission: RE | Admit: 2010-08-06 | Payer: Medicaid Other | Source: Ambulatory Visit

## 2010-08-06 ENCOUNTER — Ambulatory Visit: Payer: Medicaid Other | Admitting: Cardiology

## 2010-08-06 DIAGNOSIS — R0989 Other specified symptoms and signs involving the circulatory and respiratory systems: Secondary | ICD-10-CM

## 2010-08-10 LAB — CBC
MCHC: 35.7 g/dL (ref 30.0–36.0)
MCV: 93.1 fL (ref 78.0–100.0)
Platelets: 250 10*3/uL (ref 150–400)
RDW: 12.9 % (ref 11.5–15.5)
WBC: 6.1 10*3/uL (ref 4.0–10.5)

## 2010-08-10 LAB — COMPREHENSIVE METABOLIC PANEL
AST: 18 U/L (ref 0–37)
Albumin: 3.9 g/dL (ref 3.5–5.2)
Calcium: 9.5 mg/dL (ref 8.4–10.5)
Creatinine, Ser: 0.85 mg/dL (ref 0.4–1.2)
GFR calc Af Amer: >60 mL/min (ref >60)

## 2010-08-10 LAB — URINALYSIS, ROUTINE W REFLEX MICROSCOPIC
Bilirubin Urine: NEGATIVE
Glucose, UA: NEGATIVE mg/dL
Ketones, ur: NEGATIVE mg/dL
pH: 6 (ref 5.0–8.0)

## 2010-08-10 LAB — DIFFERENTIAL
Eosinophils Relative: 3 % (ref 0–5)
Lymphocytes Relative: 20 % (ref 12–46)
Lymphs Abs: 1.2 10*3/uL (ref 0.7–4.0)
Monocytes Absolute: 0.3 10*3/uL (ref 0.1–1.0)

## 2010-08-11 ENCOUNTER — Emergency Department (HOSPITAL_COMMUNITY): Payer: Medicaid Other

## 2010-08-11 ENCOUNTER — Emergency Department (HOSPITAL_COMMUNITY)
Admission: EM | Admit: 2010-08-11 | Discharge: 2010-08-11 | Disposition: A | Discharge status: Home / Self Care | Payer: Medicaid Other | Attending: Emergency Medicine | Admitting: Emergency Medicine

## 2010-08-11 DIAGNOSIS — R079 Chest pain, unspecified: Secondary | ICD-10-CM | POA: Insufficient documentation

## 2010-08-11 DIAGNOSIS — K219 Gastro-esophageal reflux disease without esophagitis: Secondary | ICD-10-CM | POA: Insufficient documentation

## 2010-08-11 DIAGNOSIS — Z79899 Other long term (current) drug therapy: Secondary | ICD-10-CM | POA: Insufficient documentation

## 2010-08-11 DIAGNOSIS — G8929 Other chronic pain: Secondary | ICD-10-CM | POA: Insufficient documentation

## 2010-08-11 DIAGNOSIS — K5090 Crohn's disease, unspecified, without complications: Secondary | ICD-10-CM | POA: Insufficient documentation

## 2010-08-11 DIAGNOSIS — M549 Dorsalgia, unspecified: Secondary | ICD-10-CM | POA: Insufficient documentation

## 2010-08-11 LAB — POCT CARDIAC MARKERS
CKMB, poc: <1 ng/mL — ABNORMAL LOW (ref 1.0–8.0)
CKMB, poc: <1 ng/mL — ABNORMAL LOW (ref 1.0–8.0)
CKMB, poc: <1 ng/mL — ABNORMAL LOW (ref 1.0–8.0)
Myoglobin, poc: 46.1 ng/mL (ref 12–200)
Troponin i, poc: <0.05 ng/mL (ref 0.00–0.09)
Troponin i, poc: <0.05 ng/mL (ref 0.00–0.09)
Troponin i, poc: <0.05 ng/mL (ref 0.00–0.09)

## 2010-08-11 LAB — BASIC METABOLIC PANEL
BUN: 7 mg/dL (ref 6–23)
BUN: 8 mg/dL (ref 6–23)
Calcium: 9.3 mg/dL (ref 8.4–10.5)
Chloride: 106 mEq/L (ref 96–112)
GFR calc non Af Amer: >60 mL/min (ref >60)
Glucose, Bld: 87 mg/dL (ref 70–99)
Potassium: 3.6 mEq/L (ref 3.5–5.1)
Potassium: 3.9 mEq/L (ref 3.5–5.1)
Sodium: 139 mEq/L (ref 135–145)
Sodium: 140 mEq/L (ref 135–145)

## 2010-08-11 LAB — DIFFERENTIAL
Basophils Absolute: 0 10*3/uL (ref 0.0–0.1)
Basophils Relative: 0 % (ref 0–1)
Eosinophils Absolute: 0.1 10*3/uL (ref 0.0–0.7)
Eosinophils Absolute: 0.2 10*3/uL (ref 0.0–0.7)
Eosinophils Relative: 1 % (ref 0–5)
Eosinophils Relative: 3 % (ref 0–5)
Lymphs Abs: 1.6 10*3/uL (ref 0.7–4.0)
Monocytes Absolute: 0.5 10*3/uL (ref 0.1–1.0)
Monocytes Absolute: 0.4 10*3/uL (ref 0.1–1.0)
Monocytes Relative: 5 % (ref 3–12)

## 2010-08-11 LAB — CBC
HCT: 35.9 % — ABNORMAL LOW (ref 36.0–46.0)
Hemoglobin: 12.7 g/dL (ref 12.0–15.0)
MCHC: 33.2 g/dL (ref 30.0–36.0)
MCV: 93.7 fL (ref 78.0–100.0)
Platelets: 259 10*3/uL (ref 150–400)
Platelets: 269 10*3/uL (ref 150–400)
RDW: 12.7 % (ref 11.5–15.5)
WBC: 10.9 10*3/uL — ABNORMAL HIGH (ref 4.0–10.5)
WBC: 7.8 10*3/uL (ref 4.0–10.5)

## 2010-08-11 LAB — D-DIMER, QUANTITATIVE: D-Dimer, Quant: 0.23 ug/mL-FEU (ref 0.00–0.48)

## 2010-08-13 LAB — URINALYSIS, ROUTINE W REFLEX MICROSCOPIC
Bilirubin Urine: NEGATIVE
Glucose, UA: NEGATIVE mg/dL
Hgb urine dipstick: NEGATIVE
Ketones, ur: NEGATIVE mg/dL
Nitrite: NEGATIVE
Protein, ur: NEGATIVE mg/dL
Specific Gravity, Urine: 1.02 (ref 1.005–1.030)
Urobilinogen, UA: 0.2 mg/dL (ref 0.0–1.0)
pH: 6 (ref 5.0–8.0)

## 2010-08-17 LAB — DIFFERENTIAL
Eosinophils Absolute: 0.1 10*3/uL (ref 0.0–0.7)
Lymphocytes Relative: 27 % (ref 12–46)
Lymphs Abs: 1.5 10*3/uL (ref 0.7–4.0)
Neutro Abs: 3.7 10*3/uL (ref 1.7–7.7)
Neutrophils Relative %: 66 % (ref 43–77)

## 2010-08-17 LAB — STOOL CULTURE

## 2010-08-17 LAB — CBC
Platelets: 213 10*3/uL (ref 150–400)
WBC: 5.7 10*3/uL (ref 4.0–10.5)

## 2010-08-17 LAB — OVA AND PARASITE EXAMINATION: Ova and parasites: NONE SEEN

## 2010-08-17 LAB — CLOSTRIDIUM DIFFICILE EIA: C difficile Toxins A+B, EIA: NEGATIVE

## 2010-08-17 LAB — SEDIMENTATION RATE: Sed Rate: 13 mm/hr (ref 0–22)

## 2010-08-21 ENCOUNTER — Telehealth: Payer: Self-pay | Admitting: Cardiology

## 2010-08-21 NOTE — Telephone Encounter (Signed)
PT FEELS WEAK AND HANDS ARE SHAKING, SHE STATES THAT LAST NIGHT HER HEART STARTED POUNDING AND SHE GOT SOB AND THEN STARTED VOMITING ALONG WITH CRAMPS IN HER LEGS

## 2010-08-21 NOTE — Telephone Encounter (Signed)
Pt having problems with leg cramps Sharp pain in chest, throwing up for 20 minutes, heart beating really fast Moved appt to Monday 08/24/10 at 11:40am

## 2010-08-21 NOTE — Telephone Encounter (Signed)
Gave pt an appt for Monday

## 2010-08-24 ENCOUNTER — Encounter: Payer: Self-pay | Admitting: Adult Health

## 2010-08-24 ENCOUNTER — Encounter: Payer: Self-pay | Admitting: Cardiology

## 2010-08-24 ENCOUNTER — Encounter (INDEPENDENT_AMBULATORY_CARE_PROVIDER_SITE_OTHER): Payer: Medicaid Other | Admitting: Adult Health

## 2010-08-24 DIAGNOSIS — R079 Chest pain, unspecified: Secondary | ICD-10-CM

## 2010-08-24 DIAGNOSIS — R0989 Other specified symptoms and signs involving the circulatory and respiratory systems: Secondary | ICD-10-CM

## 2010-08-24 NOTE — Progress Notes (Signed)
Cancelled.  

## 2010-08-25 ENCOUNTER — Encounter: Payer: Self-pay | Admitting: Cardiology

## 2010-08-25 ENCOUNTER — Ambulatory Visit (INDEPENDENT_AMBULATORY_CARE_PROVIDER_SITE_OTHER): Payer: Medicaid Other | Admitting: Cardiology

## 2010-08-25 DIAGNOSIS — F411 Generalized anxiety disorder: Secondary | ICD-10-CM

## 2010-08-25 DIAGNOSIS — R079 Chest pain, unspecified: Secondary | ICD-10-CM

## 2010-08-25 DIAGNOSIS — R002 Palpitations: Secondary | ICD-10-CM

## 2010-08-25 MED ORDER — METOPROLOL SUCCINATE ER 100 MG PO TB24: ORAL_TABLET | ORAL | Status: DC

## 2010-08-25 NOTE — Patient Instructions (Signed)
Your physician recommends that you schedule a follow-up appointment in:2-3 weeks Your physician has recommended you make the following change in your medication: increase toprol xl  151m daily, if ineffective in 1 week may go to 1556mdaily

## 2010-08-26 ENCOUNTER — Ambulatory Visit: Payer: Medicaid Other | Admitting: Physician Assistant

## 2010-08-26 MED ORDER — METOPROLOL SUCCINATE ER 100 MG PO TB24: 100.0000 mg | ORAL_TABLET | Freq: Every day | ORAL | Status: DC

## 2010-08-29 ENCOUNTER — Encounter: Payer: Self-pay | Admitting: Cardiology

## 2010-09-06 ENCOUNTER — Emergency Department (HOSPITAL_COMMUNITY): Payer: Medicaid Other

## 2010-09-06 ENCOUNTER — Emergency Department (HOSPITAL_COMMUNITY)
Admission: EM | Admit: 2010-09-06 | Discharge: 2010-09-06 | Disposition: A | Discharge status: Home / Self Care | Payer: Medicaid Other | Attending: Emergency Medicine | Admitting: Emergency Medicine

## 2010-09-06 DIAGNOSIS — K219 Gastro-esophageal reflux disease without esophagitis: Secondary | ICD-10-CM | POA: Insufficient documentation

## 2010-09-06 DIAGNOSIS — Y998 Other external cause status: Secondary | ICD-10-CM | POA: Insufficient documentation

## 2010-09-06 DIAGNOSIS — Z79899 Other long term (current) drug therapy: Secondary | ICD-10-CM | POA: Insufficient documentation

## 2010-09-06 DIAGNOSIS — F411 Generalized anxiety disorder: Secondary | ICD-10-CM | POA: Insufficient documentation

## 2010-09-06 DIAGNOSIS — IMO0002 Reserved for concepts with insufficient information to code with codable children: Secondary | ICD-10-CM | POA: Insufficient documentation

## 2010-09-06 DIAGNOSIS — W108XXA Fall (on) (from) other stairs and steps, initial encounter: Secondary | ICD-10-CM | POA: Insufficient documentation

## 2010-09-06 DIAGNOSIS — K5090 Crohn's disease, unspecified, without complications: Secondary | ICD-10-CM | POA: Insufficient documentation

## 2010-09-06 DIAGNOSIS — Y92009 Unspecified place in unspecified non-institutional (private) residence as the place of occurrence of the external cause: Secondary | ICD-10-CM | POA: Insufficient documentation

## 2010-09-07 NOTE — Progress Notes (Signed)
   Patient ID: [name], female    DOB: 1970-08-09, 40 y.o.   MRN: [id number]  HPI    Review of Systems    Physical Exam

## 2010-09-09 NOTE — Assessment & Plan Note (Signed)
Chest pain is improved.  With a negative stress echocardiogram, no further diagnostic testing or specific treatment is required.

## 2010-09-09 NOTE — Progress Notes (Signed)
HPI : [name] returns to the office for continued assessment and treatment of palpitations and atypical chest discomfort.  Since her last visit and initiation of therapy with metoprolol, she is improved; however, some palpitations persist.  Analysis of her event recorder reveals impressive episodes of sinus tachycardia reaching heart rates of 170 during symptomatic spells.  No significant arrhythmias were identified.  Current Outpatient Prescriptions on File Prior to Visit  Medication Sig Dispense Refill  . ALPRAZolam (XANAX) 1 MG tablet Take 1 mg by mouth at bedtime as needed.        . cyclobenzaprine (FLEXERIL) 10 MG tablet Take 10 mg by mouth 2 (two) times daily as needed.       Marland Kitchen esomeprazole (NEXIUM) 40 MG capsule Take 40 mg by mouth daily before breakfast.        . HYDROcodone-acetaminophen (NORCO) 10-325 MG per tablet Take 1 tablet by mouth every 4 (four) hours as needed.        . hyoscyamine (LEVSIN, ANASPAZ) 0.125 MG tablet Take 0.125 mg by mouth every 4 (four) hours as needed.        . Naproxen Sodium (ALEVE) 220 MG CAPS Take 1 capsule by mouth as needed.        . metoprolol (TOPROL XL) 100 MG 24 hr tablet Take 1 tablet (100 mg total) by mouth daily. If ineffective in 1 week , may go to 118m daily  45 tablet  3     Allergies  Allergen Reactions  . Codeine   . Latex   . Omeprazole     REACTION: abdominal cramps  . Penicillins       Past medical history, social history, and family history reviewed and updated.  ROS: General: no anorexia, weight gain or weight loss Cardiac: no chest pain, dyspnea, orthopnea, PND,  or syncope Respiratory: no cough, sputum production or hemoptysis GI: no nausea, abdominal pain, emesis, diarrhea or constipation Integument: no significant lesions Neurologic: No muscle weakness or paralysis; no speech disturbance; no headache   PHYSICAL EXAM: BP 109/58  Pulse 101  Ht 5' 6"  (1.676 m)  Wt 137 lb (62.143 kg)  BMI 22.11 kg/m2  SpO2 96%    General-Well developed; no acute distress Body habitus-proportionate weight and height Neck-No JVD; no carotid bruits Lungs-clear lung fields; resonant to percussion Cardiovascular-normal PMI; normal S1 and S2 Abdomen-normal bowel sounds; soft and non-tender without masses or organomegaly Musculoskeletal-No deformities, no cyanosis or clubbing Neurologic-Normal cranial nerves; symmetric strength and tone Skin-Warm, no significant lesions Extremities-1+distal pulses; no edema  ASSESSMENT AND PLAN:

## 2010-09-09 NOTE — Assessment & Plan Note (Signed)
Symptoms are associated with sinus tachycardia and are improved after treatment with a beta blocker.  Her dose of Toprol will be increased to 100 mg q.d. With reassessment in a few weeks.

## 2010-09-09 NOTE — Assessment & Plan Note (Signed)
Anxiety is likely significantly contributing to Rudine's symptoms.  Although beta blocker appears to be having salutary effects, consideration should be given to direct treatment, either pharmacologic or behavioral or both.

## 2010-09-10 ENCOUNTER — Telehealth: Payer: Self-pay | Admitting: *Deleted

## 2010-09-10 NOTE — Telephone Encounter (Signed)
Pt tolerating metoprolol 182m daily has not needed to increase to 1558m

## 2010-09-11 DIAGNOSIS — M545 Low back pain: Secondary | ICD-10-CM

## 2010-09-11 DIAGNOSIS — S0990XA Unspecified injury of head, initial encounter: Secondary | ICD-10-CM

## 2010-09-11 DIAGNOSIS — K589 Irritable bowel syndrome without diarrhea: Secondary | ICD-10-CM

## 2010-09-11 DIAGNOSIS — G8929 Other chronic pain: Secondary | ICD-10-CM | POA: Insufficient documentation

## 2010-09-11 DIAGNOSIS — R739 Hyperglycemia, unspecified: Secondary | ICD-10-CM

## 2010-09-15 ENCOUNTER — Ambulatory Visit: Payer: Medicaid Other | Admitting: Cardiology

## 2010-09-15 ENCOUNTER — Encounter: Payer: Self-pay | Admitting: Cardiology

## 2010-09-15 NOTE — H&P (Signed)
[name], [name]                  ACCOUNT NO.:  [id number]   MEDICAL RECORD NO.:  [id number]          PATIENT TYPE:  AMB   LOCATION:  DAY                           FACILITY:  [facility]   PHYSICIAN:  Chelsea Primus, MD      DATE OF BIRTH:  May 15, 1970   DATE OF ADMISSION:  03/17/2007  DATE OF DISCHARGE:  LH                              HISTORY & PHYSICAL   CHIEF COMPLAINT:  Stomach problems.   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  She presents to my office with an approximately 1-  year history of nausea and vomiting.  She states she has an episode  approximately once a month.  This is nonbloody.  It is bilious in  description.  No exacerbating or relieving features are noted.  She does  complain of intermittent fevers and chills.  However, this does not have  any association with her nausea and vomiting or her abdominal pain.  Her  pain is localized as a sharp pain in the right lower quadrant as well as  the epigastrium, as well as the right upper quadrant, which does radiate  around to her back, and there are no exacerbating or relieving factors  associated with the pain.  No change associated with food.  No change  associated with the nausea and vomiting, but she does describe a  decrease in the right lower quadrant pain with bowel movements.  She  does state with bowel movements she does have occasional hematochezia,  which has been worked up in the past.  She had a colonoscopy in the past  with positive polyps.  Her last colonoscopy was one year ago.  This one,  however, was reported normal, with no polyps evaluated at that time.  No  masses seen.  She denies any melena.  She denies any acholic stools.  She does complain  of occasional dysuria, no hematuria, but does describe  frequency and urgency.  She has had extensive workup for her abdominal  pain, including upper endoscopy, which she states she was told was  normal, other than having a small hiatal hernia.   PAST MEDICAL HISTORY:  1. She has been told she is borderline diabetic, at this point being      diet controlled, no medications.  2. Chronic gastroesophageal reflux disease.  3. Chronic lower back pain.  4. Anxiety.  5. Irritable bowel syndrome.  6. Chronic constipation.   PAST SURGICAL HISTORY:  1. Hysterectomy.  2. Cesarean section.  3. Bilateral salpingo-oophorectomy.   MEDICATIONS:  Were reviewed with the patient.  She is on:  1. Zegerid 40 mg p.o. daily.  2. Flexeril 4 times a day, unknown dose.  3. Lorcet.  4. Xanax.  5. Ambien.  6. Allopurinol.  7. Stool softener.   ALLERGIES:  1. PENICILLIN, which gives her a rash and swelling.  2.  CODEINE gives her a similar rash and swelling.   SOCIAL HISTORY:  She is a 1/2 pack per day smoker.  She denies any  alcohol or recreational drug use.   PREGNANCIES:  She has had 2 pregnancies.  She has 1 living child.   PERTINENT FAMILY HISTORY:  Positive for colon cancer in her father.  History of diabetes mellitus.  History of heart disease.   REVIEW OF SYSTEMS:  CONSTITUTIONAL:  Fever and chills, as mentioned.  Weight gain over the last several years.  No rapid gain or weight  changes.  She does complain of frequent headaches.  EYES:  She complains  of right eye pain.  No vision changes.  ENT:  Occasional rhinorrhea and  hay fever.  RESPIRATORY:  Shortness of breath occasionally.  CARDIOVASCULAR:  Occasional chest discomfort, chest pain, associated  with her abdominal pain.  She complains of varicose veins.  GASTROINTESTINAL:  Abdominal pain as above mentioned.  Nausea and  vomiting as above mentioned.  Bowel changes as above.  She does state  that bowel changes from solid stools to  loose diarrhea, with occasional  2-week intervals between bowel movements.  This is normal for her.  GENITOURINARY:  Frequency and urgency, as above mentioned.  MUSCULOSKELETAL:  Arthralgias, myalgias, back and neck pain.  SKIN:  Unremarkable.  ENDOCRINE:  Cold intolerance, heat intolerance, no  energy, and states occasional tremors.  NEUROLOGIC:  Unremarkable.   PHYSICAL EXAMINATION:  GENERAL:  The patient is an obese female in no  acute distress.  HEENT:  Scalp:  No deformities, no masses.  EYES:  Pupils equal, round, and reactive.  Extraocular movements intact.  No scleral icterus.  No conjunctival pallor.  Hearing is not diminished  on evaluation.  No deformities of the nose.  Oral mucosa is pink.  Normal occlusion.  NECK:  Trachea is midline.  No cervical lymphadenopathy.  PULMONARY:  Unlabored respiration.  No wheezes, no crackles, no rhonchi.  She is clear bilaterally to auscultation of bilateral lung fields.  CARDIOVASCULAR:  Regular rate and rhythm.  A slight systolic murmur is  appreciated.  The patient states that she has had this prior.  Pulses  are 2+ bilaterally.  ABDOMEN:  Bowel sounds are present.  Abdomen is soft.  She has mild  right upper quadrant abdominal pain.  No hernias, no masses are  apparent.  No peritoneal signs are appreciated.  EXTREMITIES:  No weakness, no deformities.  SKIN:  Warm and dry.  NEUROLOGIC:  No difficulties with speech.  Her mood is normal affect.   PERTINENT LABORATORY AND RADIOGRAPHIC STUDIES:  Ultrasound of right  upper quadrant obtained January 10, 2007 within normal limits.  No  stones, no wall thickening, no common bile duct dilatation.  A HIDA scan  obtained March 10, 2007 demonstrated and ejection fraction of 0.5%.  She did not have any symptomatology associated with the studies.  No  worsening or exacerbation of her right upper quadrant abdominal pain.   ASSESSMENT AND PLAN:  Biliary dyskinesia.  This was discussed with the   patient and the patient's family at length.  Regarding the findings, the  results of her HIDA scan were discussed with the patient, as was the  confirmation of the biliary dyskinesia.  However, due to the patient's  lack of change in her symptoms with the study as well as the patient's  long history of her symptomatology, I have little suspicion that her  gallbladder is contributing to the majority of  her symptomatology.  As  she has been confirmed to have biliary dyskinesia on HIDA, a  cholecystectomy at some point is recommended due to the nonfunctioning  gallbladder.  However, she was advised that her symptoms are likely not  to improve in total, and should she continue to have symptomatology,  this will need to be followed up.  However, the etiology is unlikely  surgical.  At this point, the risks, benefits, and alternatives of a  laparoscopic, possible open cholecystectomy were discussed at length  with the patient, including the risk of bleeding, infection, bile leak,  small bowel injury, common bile duct injury, as well as the possibility  of intraoperative cardiac or pulmonary events were discussed at length  with the patient.  Her questions and concerns were addressed, and the  patient does with to proceed with a cholecystectomy at this time for her  biliary dyskinesia.  At this point, we will schedule it at the earliest  convenience for the patient.      Chelsea Primus, MD  Electronically Signed     BZ/MEDQ  D:  03/16/2007  T:  03/17/2007  Job:  [id number]   cc:   Percell Miller L. Luan Pulling, M.D.  Fax: [facility]  Fax: [phone]

## 2010-09-15 NOTE — Op Note (Signed)
[name], [name]                  ACCOUNT NO.:  [id number]   MEDICAL RECORD NO.:  [id number]          PATIENT TYPE:  AMB   LOCATION:  DAY                           FACILITY:  [facility]   PHYSICIAN:  R. Garfield Cornea, M.D. DATE OF BIRTH:  1970/09/08   DATE OF PROCEDURE:  05/24/2008  DATE OF DISCHARGE:                               OPERATIVE REPORT   PROCEDURE:  Ileocolonoscopy with stool sampling.   INDICATIONS FOR PROCEDURE:  A 40 year old lady with a long history  irritable bowel syndrome complains of nocturnal abdominal cramping and  bloody diarrhea intermittently for past 3 months.  A colonoscopy has now  being done.  Risks, benefits, alternatives, limitations have been  reviewed, questions answered.  She is agreeable.  Please see the  documentation in the medical record.   PROCEDURE NOTE:  O2 saturation, blood pressure, pulse, and respirations  were monitored throughout the entirety of procedure.   CONSCIOUS SEDATION:  Versed 6 mg IV, Demerol 125 mg IV in divided doses,  Phenergan 25 mg diluted slow IV push in incremental doses to augment  conscious sedation.  Cetacaine spray for topical pharyngeal anesthesia.   FINDINGS:  Digital rectal exam revealed anal papilla and hemorrhoids  only.  Endoscopic findings:  The prep was marginally poor, quite a bit  of viscous stool with basic material lining the entire colon, which made  exam more difficult.  Colon:  Colonic mucosa was surveyed from the  rectosigmoid junction through the left transverse right colon to the  appendiceal orifice, ileocecal valve, and cecum.  These structures were  well seen and photographed for the record.  Terminal ileum was intubated  10 cm.  From this level, the scope was slowly and cautiously withdrawn.  All previously mentioned mucosal surfaces were again seen.  Colonic  mucosa appeared grossly normal.  There is quite a bit of vegetable  material that was not removable and should be resuctioned.  A good bit  of the sampling through scope kept getting clogged, not all of the final  mucosa in detail of the colon was seen.  There was no evidence of  colitis, gross polyp, or neoplasm.  Terminal ileum mucosa appeared  normal.  The scope was pulled down the rectum.  Thorough examination of  the rectal mucosa including retroflexed view of the anal verge  demonstrated no anal papilla and some hemorrhoids.  The patient  tolerated the procedure well and was reactive in Endoscopy.   IMPRESSION:  Anal canal internal hemorrhoids and papilla, otherwise  normal rectum, grossly normal colon pull prep.  Certainly no evidence of  inflammatory bowel disease.  Normal terminal ileum status post stool  sampling.   I suspect the patient experienced benign anorectal bleeding in the  setting of excerebration of irritable bowel syndrome, although recent  enteric infection is not excluded.   RECOMMENDATIONS:  1. Follow up on stool studies, obtain a CBC and a sed rate today.      Begin Librax 1 tablet a.c. p.r.n. abdominal cramps, diarrhea.  A 10-      day course of Anusol-HC  Suppository one per rectum at bedtime.  2. Further recommendations is to follow.      Bridgette Habermann, M.D.  Electronically Signed     RMR/MEDQ  D:  05/24/2008  T:  05/25/2008  Job:  [id number]   cc:   Percell Miller L. Luan Pulling, M.D.  Fax: [phone]

## 2010-09-15 NOTE — Assessment & Plan Note (Signed)
NAMELATOY, LABRIOLA                   CHART#:  [id number]   DATE:  06/17/2008                       DOB:  1971-01-05   CHIEF COMPLAINT:  Abdominal pain and diarrhea.   SUBJECTIVE:  The patient is here for a followup visit.  She has a  history of chronic IBS and recently was seen for nocturnal abdominal  cramping, bloody diarrhea for 3 months duration.  She underwent a  colonoscopy by Dr. Gala Romney on May 24, 2008, which revealed anal canal  internal hemorrhoids and papilla, grossly normal colon, although poor  prep, certainly no evidence of IBD, normal terminal ileum.  Stool was  sent for sampling, was negative for C. diff culture, positive for  lactoferrin.  Her CBC and sed rate were normal.   She has called in multiple times stating that her abdominal pain is  worse since her colonoscopy.  It was felt she likely had a flare of her  IBS.  She was given Librax and advised to take before meals.  She tells  me that she only eats once a day because of severe abdominal pain and  diarrhea.  She has been taking Librax once daily with some results.  Every time she eats, she has diarrhea.  She is having 8 stools a day.  She continues to have some bright red blood per rectum, but not as much  bleeding.  She complains of severe abdominal pain worse on the right  side of her abdomen.  Pain is worse with meals.  She is down 7 pounds  since we last saw her.  She previously was checked and had negative  celiac antibody panel.  She tells me she also has lot of memory loss and  thinks this is because of the severe pain she is in.  She is very  tearful throughout the interview.  She complains of numbness in all 4  extremities, but states it is worse on the left arm.  She complains pain  in her chest, which she has been having since November.  She also tells  me that the numbness in her extremities has been persistent at least in  the upper extremities since November as well.  She has had multiple  testing done by her neurosurgeon.  She tells me she has a bulging disk  and degenerative disk disease.  She complains of her heart racing.  She  denies shortness of breath.  She denies being anymore stressed out or  anxious than usual.  She saw Dr. Lattie Haw last year for atypical chest  pain and had negative stress echo.   CURRENT MEDICATIONS:  See updated list.   ALLERGIES:  Penicillin, codeine, Prilosec.  She believes this is causing  abdominal cramps and she tells [place] today that she is not allergic to  Nexium as she stated before.  She has actually been taking Nexium 80 mg  q.a.m. on her own lately.   PHYSICAL EXAMINATION:  VITAL SIGNS:  Weight 143.5 down 7 pounds, temp  98.3, blood pressure 120/82, and pulse 96.  Orthostatic blood pressure  and pulse, lying was 120/88 and pulse 80, sitting 120/88 and pulse 80,  standing 130/90 and pulse 96.  GENERAL:  Very tearful, anxious Caucasian female in no acute distress.  She is easily calmed with reassurance.  SKIN:  Warm and dry.  No jaundice.  HEENT:  Sclerae nonicteric.  Oropharyngeal mucosa moist and pink.  CHEST:  Lungs are clear to auscultation.  CARDIOVASCULAR:  Regular rate and rhythm.  Normal S1 and S2.  No  murmurs, rubs, or gallops.  ABDOMEN:  Positive bowel sounds.  Abdomen is soft.  She has diffuse  tenderness throughout the abdomen to palpation, right sided more than  left.  No rebound or guarding.  No organomegaly or masses.  No abdominal  bruits or hernias.  LOWER EXTREMITIES:  No edema.   IMPRESSION:  The patient is a 40 year old lady with multiple  gastrointestinal complaints and non-gastrointestinal issues.  She  complains of profuse diarrhea up to 8 stools a day and abdominal pain,  which she states has been worse since her colonoscopy a month ago.  She  has had 7-pound weight loss, well documented.  She has chronic irritable  bowel syndrome with chronic right-sided abdominal pain.  She usually has  a bowel movement  about 3-4 times a week.  She has documented weight  loss.  She has other vague symptoms with slight tachycardia, extremity  numbness, memory issues, and complaints of intermittent chest pain  (currently, not having any as well).  I have asked her to follow up with  Dr. Luan Pulling in the next 24-48 hours regarding these issues.  It is  unclear at this point if all of her symptoms can be explained about one  entity.  I suspect many of her symptoms are related to irritable bowel  syndrome and gastroesophageal reflux disease.  Given the severity of her  abdominal pain and diarrhea, we would pursue further workup.   PLAN:  1. CBC, CMET, lipase, TSH, urinalysis with culture (complaint of      dysuria).  2. CT of the abdomen and pelvis with IV contrast.  3. She will follow up with Dr. Luan Pulling in the next 24-48 hours      regarding her non GI issues.  4. She has been instructed if she develops chest pain, SOB she should      go to the emergency department immediately.  5. She will take Librax 3-4 times daily.  6. Further recommendations to follow.       Neil Crouch, P.A.  Electronically Signed     R. Garfield Cornea, M.D.  Electronically Signed    LL/MEDQ  D:  06/17/2008  T:  06/17/2008  Job:  [id number]   cc:   Percell Miller L. Luan Pulling, M.D.

## 2010-09-15 NOTE — Assessment & Plan Note (Signed)
[name], [name]                   CHART#:  [id number]   DATE:  10/25/2006                       DOB:  Jul 22, 1970   HISTORY OF PRESENT ILLNESS:  This lady is referred over by Dr. Luan Pulling  for recent rectal bleeding of 3 months duration. She did not keep her  Sep 07, 2006 appointment with [place].   This lady was last seen here June 20, 2006 for irritable bowel  syndrome, gastroesophageal reflux disease. She had a history of  alternating constipation and diarrhea. Since being seen in February, she  has developed a swing towards constipation. Tells me that she has not  had any bowel movement in 3 weeks. She strains and passes a little blood  per rectum. It is notable that this lady had a colonoscopy in July 2007,  which demonstrated only internal hemorrhoids. She also had an EGD, which  was unremarkable. She tells me that Aciphex has failed to adequately  treat her reflux symptoms. She denies odynophagia or dysphagia. Her  weight is stable. She has not had any melena and again, constipation  over the past 3 weeks and she shifted from the diarrhea side of the  syndrome. Celiac antibody panel previously came back normal from  05/09/06. CBC revealed a white count of 6.6. Hemoglobin and hematocrit  14.7 and 43.6. Sed rate was minimally elevated at 1 point above normal  23. Previously TSH and serum calcium came back negative. She had had  some rectal bleeding in the past, for which she responded to a topical  mesalamine and Canasa suppositories.   PAST MEDICAL HISTORY:  Significant for chronic anxiety neurosis,  constipation, heart burn, status post appendectomy, left salpingo-  oophorectomy, hysterectomy, cesarean section previously.   FAMILY HISTORY:  Negative for colorectal cancer. She has a maternal  cousin with Crohn's disease. Father died at age 62 with pancreatic  carcinoma.   CURRENT MEDICATIONS:  Xanax 1 mg q.i.d., Lorcet Plus q.i.d., stool  softener once daily, Aciphex 20 mg  daily, Ambien 10 mg at bedtime,  Levbid p.r.n., Benefiber daily.   ALLERGIES:  PENICILLIN, CODEINE, NEXIUM.   PHYSICAL EXAMINATION:  VITAL SIGNS:  She appears at a baseline weight of  149. Height 5 foot 6. Temperature 98.1, blood pressure 138/88, pulse 88.  SKIN:  Warm and dry. There is no jaundice.  CHEST:  Lungs are clear to auscultation.  CARDIAC:  Regular rate and rhythm. Without murmur, rub, or gallop.  ABDOMEN:  Nondistended. Positive bowel sounds. She has mild diffuse  tenderness to palpation. No appreciable mass or organomegaly.  EXTREMITIES:  No edema.   ASSESSMENT:  [name] has long standing irritable bowel syndrome,  alternating with constipation and diarrhea but she is constipation  predominant these days. History of gastroesophageal reflux disease,  refractory to the proton pump inhibitor therapy more recently. Negative  EGD previously. I suspect that she has an element of functional  gastroesophageal reflux disease (NERD or non-erosive reflux disease.)  Not mentioned above, she had been taking MiraLax as well and this has  not really been of any benefit for her.   RECOMMENDATIONS:  1. At this time, I have recommended that she try some Amitiza. Will go      the chronic constipation dose of 24 mcg once daily for 5 days with  breakfast and them bump it up to 24 mcg b.i.d. with breakfast and      supper thereafter. We may end up having to back off to the IBS dose      of 8 mcg but will see how she does on the 24 mcg b.i.d. for the      next month. I have given her samples. Stop Aciphex. Begin Zegerid      40 mg orally daily, samples x30 days. She is to continue Benefiber      once daily.  2. Canasa 1 gram.  3. Mesalamine suppository for anorectal bleeding, 1 per rectum nightly      x2 weeks.  4. She is to keep a stool diary.  5. Will plan to see this nice lady back in 1 month and go from there.   I would like to thank Dr. Sinda Du for allowing me to see  this  nice lady once again.       [name], M.D.  Electronically Signed     RMR/MEDQ  D:  10/25/2006  T:  10/25/2006  Job:  [id number]   cc:   Percell Miller L. Luan Pulling, M.D.

## 2010-09-15 NOTE — H&P (Signed)
[name], [name]                  ACCOUNT NO.:  [id number]   MEDICAL RECORD NO.:  [id number]          PATIENT TYPE:  AMB   LOCATION:                                FACILITY:  [facility]   PHYSICIAN:  R. Garfield Cornea, M.D. DATE OF BIRTH:  [birth year]   DATE OF ADMISSION:  DATE OF DISCHARGE:  LH                              HISTORY & PHYSICAL   CHIEF COMPLAINT:  Followup problem swallowing, intermittent nausea and  vomiting.   HISTORY OF PRESENT ILLNESS:  [name] is here for followup.  She was last  seen on November 29, 2006.  She has a history of chronic constipation with  IBS and gastroesophageal reflux disease.  At her last office visit, she  complained of dysphagia.  She was scheduled for a barium pill  esophagram.  Unfortunately, due to a family death, she did not make that  appointment.  She presents today stating for the past 4 months she has  had intermittent nausea and vomiting occurring sometimes for 2 to 3 days  at a time.  This occurs usually once a month.  She continues to have  dysphagia to solid food.  She notes the Zegerid has helped a great deal  with her heartburn.  She was started on that last month.  She is still  having quite a bit of time of her bowel movements.  She is having a  bowel movement once weekly.  She tried Amitiza 8 mcg once daily and then  increased to b.i.d. without any great result.  Previously has been on  MiraLax.  She previously was also on Amitiza 24 mcg daily for a few  days.  Currently, she is taking Benefiber and stool softeners.  She at  times does see blood in the stool.  Stools are often hard.  She has  chronic right-sided abdominal pain which is unrelated to her nausea and  vomiting.  She had an EGD and colonoscopy in July 2007 by Dr. Gala Romney.  Her EGD was normal.  Her colonoscopy revealed internal hemorrhoids and  anal papillae.  She had normal terminal ileum.   CURRENT MEDICATIONS:  1. Xanax 1 mg q.i.d.  2. Lorcet Plus q.i.d.  3. Stool softener  p.r.n.  4. Ambien 10 mg nightly p.r.n.  5. Benefiber daily.  6. Zegerid 40 mg daily.  7. Flexeril q.i.d.   ALLERGIES:  PENICILLIN, CODEINE AND NEXIUM.   PAST MEDICAL HISTORY:  1. Chronic low back pain with lumbar disk disease.  2. Cesarean section in 1996.  3. Hysterectomy with right salpingo-oophorectomy.  4 . Left salpingo-oophorectomy.  1. Appendectomy in May 2006.  2. Chronic constipation.  3. Colonoscopy and EGD, as outlined above.  4. Chronic GERD.  5. Anxiety.  6. IBS with constipation predominance currently.   FAMILY HISTORY:  Negative for colorectal cancer.  She has a maternal  cousin with Crohn disease.  Father died at age 19 of pancreatic  carcinoma.   SOCIAL HISTORY:  She is married, this is her second marriage.  She has 2  children.  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  Fifteen-pack-year history of tobacco use.  Denies any alcohol  or drug use.   REVIEW OF SYSTEMS:  GI:  See HPI.  CONSTITUTIONAL:  No weight loss.  CARDIOPULMONARY:  No chest pain or shortness of breath.   PHYSICAL EXAMINATION:  VITAL SIGNS:  Weight:  153.  Height:  5 feet 6  inches.  Temperature 98.7, blood pressure 110/82, pulse 88.  GENERAL:  Pleasant, well-nourished, well-developed, Caucasian female in  no acute distress.  SKIN:  Warm and dry, no jaundice.  HEENT:  Sclerae nonicteric.  Oropharyngeal mucosa moist and pink.  CHEST:  Lungs are clear to auscultation.  CARDIAC:  Regular rate and rhythm.  No murmurs, rubs or gallops.  ABDOMEN:  Positive bowel sounds.  Soft, nondistended, nontender.  No  organomegaly or masses.  No rebound tenderness or guarding.  No  abdominal bruits or hernias.  EXTREMITIES:  No edema.   IMPRESSION:  [name] is a 40 year old lady with chronic gastroesophageal  reflux disease who continues to have persistent dysphagia.  Now she is  having intermittent nausea and vomiting several days a month.  Symptoms  occur postprandially.   She has had chronic right lower quadrant  abdominal pain which she has had for years which is unrelated to her  current symptoms.  She is status post appendectomy in the past.  She  continues to have chronic constipation without much results.  Regarding  her dysphagia, she previously cancelled her barium pill esophagram.  She  is requesting at this time upper endoscopy, which I feel is reasonable  given her development of intermittent nausea and vomiting.  I explained  to her that we cannot exclude possibility of biliary etiology at this  time.  She has never had an ultrasound of her gallbladder that I can  find within the system.  Regarding chronic constipation she has been on  multiple regimens in the past.  We will continue to try other regimens.  The patient had to leave the office in a hurry to pick up her child.  Therefore, we will touch base with her via phone regarding new  medications.   PLAN:  1. Need to schedule an EGD for further evaluation of nausea, vomiting      and dysphagia.  2. If her EGD is unremarkable, we would consider gallbladder workup.  3. She will continue Benefiber daily.  Will touch base with her by      phone to determine the next regimen for her chronic constipation.      Neil Crouch, P.ABridgette Habermann, M.D.  Electronically Signed    LL/MEDQ  D:  12/29/2006  T:  12/30/2006  Job:  [id number]   cc:   Percell Miller L. Luan Pulling, M.D.  Fax: [phone]

## 2010-09-15 NOTE — Assessment & Plan Note (Signed)
NAMECORYNNE, [name]                   CHART#:  [id number]   DATE:  11/29/2006                       DOB:  30-Aug-1970   Followup office, the patient with constipation, GERD, last seen  10/25/2006.  The patient took a few doses of Amitiza, felt it caused  abdominal cramps, stopped taking it, did not call us.  Also complains of  difficulty getting solids and liquids down when she swallows, but no  pain.  Has not had any more rectal bleeding.  Colonoscopy, July 2007,  demonstrated no significant abnormalities, also EGD with similar  findings.  She is taking Lortab for her back, she also takes Flexeril,  which, no doubt, is impeding colonic motility.  She takes Zegerid 40 mg  once daily to twice daily for good control of reflux symptoms.  The  patient tells me she has gone, now, 2 weeks without a bowel movement.   CURRENT MEDICATIONS:  See updated list.   ALLERGIES:  PENICILLIN, CODEINE, NEXIUM.   PHYSICAL EXAMINATION:  She is accompanied by her mother.  Weight 149, height 5 feet 6 inches, temp 97.9, BP 110/80, pulse 68.  SKIN:  Warm and dry.  CHEST:  Lungs are clear to auscultation.  CARDIAC EXAM:  Regular rate and rhythm without murmurs, gallops, or  rubs.  ABDOMEN:  Nondistended, positive bowel sounds, soft, minimally mildly  diffusely tender without appreciable mass or organomegaly.  EXTREMITY EXAM:  No edema.   ASSESSMENT:  Chronic constipation/constipation, Amitiza was poorly  tolerated at 24 mcg dose; however, I feel if we could find the right  dosage regimen, this may be a great aid for the patient.   RECOMMENDATIONS:  1. We will go, at this point, and just give her a Go-LYTELY purge, 4 L      8 oz over every 15 minutes until gone this evening.  Then we will      start her on some Amitiza at the 8 mcg dose once daily with      breakfast x5 days, and then will increase to twice daily      thereafter.  Samples given.  She has some vague dysphagia symptoms.      Will plan a  barium pill esophagram.  2. Gastroesophageal reflux disease responsive to Zegerid.  Will give      her some samples of Zegerid 40 mg once daily to twice daily.      Followup here in 1 month.       Bridgette Habermann, M.D.  Electronically Signed     RMR/MEDQ  D:  11/29/2006  T:  11/30/2006  Job:  [id number]   cc:   Percell Miller L. Luan Pulling, M.D.

## 2010-09-15 NOTE — H&P (Signed)
[name], [name]                  ACCOUNT NO.:  [id number]   MEDICAL RECORD NO.:  [id number]          PATIENT TYPE:  AMB   LOCATION:  DAY                           FACILITY:  [facility]   PHYSICIAN:  R. Garfield Cornea, M.D. DATE OF BIRTH:  12-01-1970   DATE OF ADMISSION:  DATE OF DISCHARGE:  LH                              HISTORY & PHYSICAL   REASON FOR OFFICE VISIT:  Abdominal pain, rectal bleeding.   HISTORY OF PRESENT ILLNESS:  [name] is a 40 year old lady with history of  chronic GERD, irritable bowel syndrome, who we last saw in July 2008,  who presents today for further evaluation of abdominal pain and rectal  bleeding.  She called back in November complaining of a large-volume  hematochezia.  She was advised to go to the emergency department if she  was seeing significant amounts of blood.  She had multiple no shows on  July 11, 2007, June 23, 2007, and June 02, 2007.  Therefore, we  were required to discuss with Dr. Gala Romney prior making her appointment.  She states on that occasion she passed large volume of bright red blood  per rectum with clots.  Since then, she has had continued intermittent  bright red blood but a smaller amount.  She is quite concerned because  she is having worsening abdominal cramps.  She tells me that she is  woken up almost every night with abdominal cramps like she needs to have  a bowel movement.  Often she does have stool associated with this.  She  may go to the bathroom 3 or 4 times a week, but once a week.  When she  finally does go, she has painful abdominal cramps followed by loose  stool.  She denies any vomiting, but does have some nausea.  She has  chronic GERD, currently on Prilosec OTC b.i.d.  Denies dysphagia,  aphasia, or weight loss.  When she eats, her abdominal cramps get worse.  Sometimes it is so bad, she gets diaphoretic.  She feels like she may  pass out.   CURRENT MEDICATIONS:  1. Xanax 1 mg q.i.d.  2. Lorcet Plus q.i.d.  3. Stool softeners p.r.n.  4. Benefiber occasionally.  5. Flexeril q.i.d.  6. Prilosec OTC b.i.d.   ALLERGIES:  PENICILLIN, CODEINE, and NEXIUM.   PAST MEDICAL HISTORY:  Chronic low back pain with lumbar disk disease,  chronic GERD, IVS, anxiety, cesarean section in 1996, hysterectomy with  right salpingo-oophorectomy, left salpingo-oophorectomy for tumor,  appendectomy in May 2006, cholecystectomy.   Last EGD in September 2008 revealed small hiatal hernia.  Normal  esophagus status post 56-French Maloney dilator.  Last colonoscopy was  in 2007.  She had internal hemorrhoids and anal papillae.  Normal  terminal ileum.  She reports having colon polyps on prior colonoscopy in  2002.   FAMILY HISTORY:  Father deceased at age 52, pancreatic cancer and per  patient colon cancer.  Maternal cousin with Crohn disease.   SOCIAL HISTORY:  She is married, 2 children.  She owns her own business,  home repairs.  She smokes 15 cigarettes a day.  No alcohol use.   REVIEW OF SYSTEMS:  GI:  See HPI for GI.  CONSTITUTIONAL:  No weight  loss.  CARDIOPULMONARY:  No chest pain, shortness of breath,  palpitations, or cough.  GENITOURINARY:  No dysuria or hematuria.   PHYSICAL EXAMINATION:  VITAL SIGNS:  Weight 150.5, height 5 feet 6-1/2  inches, temp 97.6, blood pressure 92/60, pulse 74.  GENERAL:  Pleasant, well-nourished, well-developed Caucasian female in  no acute distress.  SKIN:  Warm and dry.  No jaundice.  HEENT: Sclerae nonicteric.  Oropharyngeal mucosa moist and pink.  No  lesions, erythema, or exudate.  No lymphadenopathy, thyromegaly.  CHEST:  Lungs are clear to auscultation.  CARDIAC:  Regular rate and rhythm.  No murmurs, rubs. or gallops.  ABDOMEN:  Positive bowel sounds.  Abdomen is soft.  She has tenderness  throughout the abdomen to even light palpation.  No rebound or guarding.  No organomegaly or masses.  No abdominal bruits or hernias.  LOWER EXTREMITIES:  No edema.    IMPRESSION:  [name] is a 40 year old lady with chronic gastroesophageal  reflux disease, history of irritable voiding syndrome, who presents with  complaints of nocturnal abdominal cramping and loose stools.  She  recently had large-volume hematochezia with blood clots and has had  several episodes of small-volume hematochezia since that time.  She has  a history of hemorrhoids.  Given her nocturnal symptoms, I am concerned  about potential other possibilities rather than simply albeit with  hemorrhoids.  She now gives a family history of colon cancer in her  father.  We need to further evaluate her symptoms with colonoscopy.  She  has chronic gastroesophageal reflux disease well controlled on Prilosec  OTC, but prefers Zegerid.   PLAN:  1. Colonoscopy with Dr. Gala Romney in the near future.  2. Kapidex samples given in lieu of Zegerid which we did not have #30.  3. Further recommendations to follow.      Neil Crouch, P.ABridgette Habermann, M.D.  Electronically Signed    LL/MEDQ  D:  05/13/2008  T:  05/14/2008  Job:  [id number]   cc:   Percell Miller L. Luan Pulling, M.D.  Fax: [phone]

## 2010-09-15 NOTE — Op Note (Signed)
NAMESHAKORA, [name]                  ACCOUNT NO.:  [id number]   MEDICAL RECORD NO.:  [id number]          PATIENT TYPE:  AMB   LOCATION:  DAY                           FACILITY:  [facility]   PHYSICIAN:  Chelsea Primus, MD      DATE OF BIRTH:  1970/07/01   DATE OF PROCEDURE:  03/17/2007  DATE OF DISCHARGE:                               OPERATIVE REPORT   PREOPERATIVE DIAGNOSIS:  Biliary dyskinesia.   POSTOPERATIVE DIAGNOSIS:  Biliary dyskinesia.   PROCEDURE:  Laparoscopic cholecystectomy.   SURGEON:  Chelsea Primus, M.D.   ANESTHESIA:  General endotracheal.  Local anesthetic 1% Sensorcaine  plain.   ESTIMATED BLOOD LOSS:  Minimal.   SPECIMENS:  Gallbladder.   INDICATIONS:  Patient is a 40 year old female with a history of  abdominal pain in the right lower quadrant, epigastrium right upper  quadrant, radiating around to her back.  She has been worked up  extensively for her diffuse abdominal pain, including lower and upper  endoscopy, right upper quadrant ultrasound, and HIDA scan evaluation.  During this workup, her HIDA scan demonstrated a biliary ejection  fraction of 0.5%, consistent with biliary dyskinesia.  I evaluated the  patient in my office and discussed with her at length indications for  cholecystectomy.  In discussion with her, I feel that some of her  symptomatology may actually be related to her gallbladder, but I cannot  confirm that all of her symptomatology is related to her biliary  dyskinesia.  Therefore, I did recommend with her a laparoscopic,  possible open cholecystectomy for her biliary dyskinesia with the  understanding that her symptomatology may or may not resolve and that  this was simply due to her dysfunctional gallbladder.  The risks,  benefits and alternatives including bleeding, infection, bile leak,  small bowel injury, common bile duct injury, as well as the possibility  of intraoperative cardiac or pulmonary events were discussed at length  with  the patient and the patient's family.  The patient's questions and  concerns were addressed, and the patient was consented for the planned  procedure.   OPERATION:  Patient was taken to the operating room.  Was placed in a  supine position on the operating room table, at which time she was given  general anesthetic.  Once the patient was asleep, she was intubated by  anesthesia.  At this point, her abdomen was prepped and draped in the  usual fashion.  An infraumbilical incision was created with the scalpel.  A Kelly clamp was utilized to dissect down to the abdominal wall fascia,  grasped with __________  anteriorly.  A Varies needle was inserted.  Saline drop test was utilized to confirm intraperitoneal placement.  Pneumoperitoneum was established.  Once sufficient pneumoperitoneum was  obtained, the 11 mm trocar was inserted over the laparoscope to the  infraumbilical incision, visually watching entrance into the peritoneal  cavity on the monitors.  At this point, the cannula was removed.  The  laparoscope was reinserted.  There was no evidence of any Varies or  trocar placement injury.  This time, the  remaining trocars were placed  with an 11 mm trocar in the epigastrium, a 5 mm trocar in the midline  between the two 11 mm trocars and a 5 mm trocar on the right lateral  abdominal wall.  These were all placed with skin incisions and placement  of the trocar __________  visualization.  At this point, the patient was  placed in a head-up left lateral decubitus position.  The fundus and the  gallbladder were grasped and elevated up and over the liver.  Multiple  omental adhesions were stripped from the wall of the gallbladder with  blunt dissection.  This continued down through the infundibulum, at  which point the peritoneal reflection on the gallbladder was bluntly  stripped using a IT consultant.  Identifying the cystic duct, a  window was created behind the cystic duct, where  endoclips were placed  proximally and one distally.  The cystic duct was divided between the  two most distal clips.  At this point, the cystic artery was identified.  Two endoclips were placed proximally and one distally.  The cystic  artery was divided between the two most distal clips.  With continued  dissection, there became another vascular structure clearly entering  onto the posterior aspect of the gallbladder, suspicious for a posterior  branch.  A window was created behind this structure.  Two endoclips were  placed proximally and one distally.  This was also divided between the  two most distal clips.  At this point, electrocautery was utilized to  dissect the gallbladder free from the gallbladder fossa.  Hemostasis was  obtained using electrocautery.  The gallbladder was then placed in the  Endocatch bag and was placed up and over the right lobe of the liver  while attention was turned to placement of the fascial closure sutures.   At this point, using an Endoclose suture passing device, a 0 Vicryl  suture was placed through both the 11 mm trocar sites.  With the sutures  in place, a piece of Surgicel was brought to the field and was placed  into the gallbladder fossa.  Prior to placement, it was reinspected.  There was no evidence of any hemorrhage.  Hemostasis was excellent.  The  Surgicel was placed, and at this time, the Endocatch bag was grasped and  pulled through the epigastric trocar site, using a combination of blunt  Kelly dissection to enlarge the epigastric trocar site enough to remove  the gallbladder.  The gallbladder was removed in an intact Endocatch  bag, which was placed on the back table and was sent as a permanent  specimen to pathology.  At this point, the pneumoperitoneum was  evacuated.  All trocars were removed.  The Vicryl sutures were secured.  The local anesthetic was injected.  The patient was returned to a supine  position.  A 4-0 Monocryl was  utilized, and running subcuticular sutures  were reapproximated at the skin edges at all trocar sites.  The skin was  then washed and dried with a moist, dried towel.  Benzoin was applied  around the incisions.  Half-inch Steri-Strips were placed over the  incisions.  The patient was allowed to come out of general anesthetic.  She was transferred to a regular hospital bed and transferred to the  post anesthesia care unit in stable condition.  At the conclusion of the  procedure, all instrument, sponge, and needle counts were correct.  Patient tolerated the procedure well.  Chelsea Primus, MD  Electronically Signed     BZ/MEDQ  D:  03/17/2007  T:  03/18/2007  Job:  [id number]   cc:   Percell Miller L. Luan Pulling, M.D.  Fax: [phone]

## 2010-09-15 NOTE — Op Note (Signed)
NAMEHAROLDINE, [name]                  ACCOUNT NO.:  [id number]   MEDICAL RECORD NO.:  [id number]          PATIENT TYPE:  AMB   LOCATION:  DAY                           FACILITY:  [facility]   PHYSICIAN:  R. Garfield Cornea, M.D. DATE OF BIRTH:  1971/04/24   DATE OF PROCEDURE:  01/09/2007  DATE OF DISCHARGE:  01/09/2007                                PROCEDURE NOTE   DIAGNOSTIC ESOPHAGOGASTRODUODENOSCOPY:   INDICATIONS FOR PROCEDURE:  A 40 year old lady with chronic  gastroesophageal reflux disease symptoms and esophageal dysphagia,  intermittent nausea and vomiting.  EGD is now being done.  This approach  has been discussed with the patient at length.  Potential risks,  benefits and alternatives have been reviewed, questions answered.  She  is agreeable.  Please see documentation in the medical record.   PROCEDURE NOTE:  O2 saturation, blood pressure, pulse and respirations  were monitored throughout the entire procedure.  Conscious sedation of  Versed 7 mg IV, Demerol 150 mg IV, Phenergan 25 mg diluted slow IV push  to augment conscious sedation.  Cetacaine spray for topical  oropharyngeal anesthesia.  Instrumentation:  Pentax video chip system.   FINDINGS:  Examination of the tubular esophagus revealed normal mucosa,  the EG junction easily traversed.  The tubular esophagus was patent  without ring, stricture, neoplasm or other structural abnormality.   Stomach:  The gastric cavity was empty and insufflated well with air.  A  thorough examination of the gastric mucosa including retroflexion in the  proximal stomach and esophagogastric junction demonstrated only a small  hiatal hernia.  Mucosa otherwise appeared normal.  Pylorus patent and  easily traversed.  Examination of the bulb and second portion revealed  no abnormalities.   therapeutic/diagnostic maneuvers performed:  A 56-French Maloney dilator  was passed to full insertion.  A look back revealed no apparent  complication related  to passage of the dilator.  The patient tolerated  the procedure well, was reacted in endoscopy.   IMPRESSION:  1. Normal esophagus, status post passage of a 56-French Maloney      dilator.  2. Small hiatal hernia.  3. Otherwise normal stomach, D1, D2.   RECOMMENDATIONS:  Will evaluate her postprandial upper GI symptoms  further with gallbladder ultrasound.  Further recommendations to follow  in the very near future.      Bridgette Habermann, M.D.  Electronically Signed     RMR/MEDQ  D:  01/17/2007  T:  01/18/2007  Job:  [id number]   cc:   Percell Miller L. Luan Pulling, M.D.  Fax: [phone]

## 2010-09-15 NOTE — Procedures (Signed)
[name], [name]                  ACCOUNT NO.:  [id number]   MEDICAL RECORD NO.:  [id number]          PATIENT TYPE:  OUT   LOCATION:  RAD                           FACILITY:  [facility]   PHYSICIAN:  Cristopher Estimable. Lattie Haw, MD, FACCDATE OF BIRTH:  1970/06/01   DATE OF PROCEDURE:  05/30/2007  DATE OF DISCHARGE:  05/30/2007                                ECHOCARDIOGRAM   CLINICAL DATA:  A 40 year old woman with chest pain.  1. Treadmill exercise performed to a workload of 10 minutes and a      heart rate of 127, 70% of age-predicted maximum.  Exercise      discontinued due to fatigue with some dyspnea; no chest discomfort      reported.  2. Baseline echocardiogram:  Normal left and right atrial size; normal      right ventricular size and function with borderline RVH; normal      mitral, tricuspid and aortic valve; normal left ventricular size      and function; no LVH; minimal posterior pericardial effusion.  3. 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  4. Stress EKG:  No significant change.  5. Post-exercise echocardiographic images showed a decrease in left      ventricular volume and an increase in contractility in all      segments.   IMPRESSION:  Negative stress echocardiogram revealing adequate exercise  capacity, a limited heart rate response to exercise achieving a somewhat  submaximal level and neither electrocardiographic nor echocardiographic  evidence for myocardial ischemia or infarction.  Other findings as  noted.      Cristopher Estimable. Lattie Haw, MD, [facility]  Electronically Signed     RMR/MEDQ  D:  06/02/2007  T:  06/02/2007  Job:  [id number]

## 2010-09-15 NOTE — Letter (Signed)
May 24, 2007    [name], M.D.  [address]  [place], [place] [address]   RE:  [name], Christine  MRN:  [id number]  /  DOB:  [birth year]   Dear [name]:   It was my pleasure evaluating [name] in the office today in  consultation at your request.  As you know, this nice woman was recently  seen in the emergency department, initially complaining of chest  discomfort.  The physician in the ED was apparently more impressed with  impaired mental status and did not do much to assess her cardiac status.  She did have a negative set of cardiac markers.  Her impairment was  thought to be due to benzodiazepines and apparently resolved  spontaneously in the department.  She was subsequently seen in your  office complaining of chest discomfort and of palpitations and referred  here.   [name] has no prior cardiac history.  She has never been evaluated by  a cardiologist.  She has not undergone any significant cardiac testing.  She has had significant other issues including and GERD, irritable bowel  syndrome, anxiety, depression, tobacco use with a 20-pack-year total  consumption, DJD with low back pain, and constipation.  Surgeries have  included a left oophorectomy followed 10 years later by a hysterectomy  and right oophorectomy, cholecystectomy, and just a few months ago, a  remote C-section, and an appendectomy a few years ago.   She reports an allergy to PENICILLIN and CODEINE.  Her only medications  are Zegerid 40 mg daily and Xanax 1 mg q.i.d., which she says she has  been taking since 2004.  She also uses Flexeril and Lorcet p.r.n.   Her chest discomfort is characterized as sharp left inframammary pain  that occurs unpredictably, typically at rest.  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  The pain is sharp and intense  and can last up to 15 minutes.  There has been radiation to the left arm  and to the left leg.  Palpitations are less impressive.  She  reports  sometimes noting a fast heartbeat that leaves her weak.  She has had no  dyspnea, diaphoresis nor nausea.  There has been no lightheadedness or  syncope.   SOCIAL HISTORY:  No excessive use of alcohol; sedentary lifestyle except  for the requirements for profession; married with one child.   FAMILY HISTORY:  Father died at age 75 with neoplastic disease.  Mother  is alive, but has cardiac problems.  She has 3 siblings who are alive  and well.   REVIEW OF SYSTEMS:  Notable for occasional headaches, previously been  told of a heart murmur, a regular diet with stable weight and appetite.  She gained 50 pounds at the time of her hysterectomy.  She notes  occasional edema in her legs.   EXAM:  Pleasant, somewhat overweight woman in no acute distress.  Weight is 150, blood pressure 90/60, respirations 18.  Heart rate 95 and  regular.  HEENT:  EOMs full; normal lids and conjunctivae; normal oral mucosa.  NECK:  No jugular venous distention; normal carotid upstrokes without  bruits.  LUNGS:  Clear.  CARDIAC:  Normal first and second heart sounds; minimal basilar systolic  ejection murmur.  Normal PMI.  ABDOMEN:  Soft and nontender; no organomegaly.  EXTREMITIES:  No edema; normal distal pulses.  NEUROLOGIC:  Fairly alert with coherent and appropriate speech; slight  slurring; cranial nerves intact; no  focal findings.   EKG:  Normal sinus rhythm; borderline left atrial abnormality; delayed R-  wave progression; borderline low voltage; no prior tracing for  comparison.   IMPRESSION:  [name] has atypical chest discomfort, particularly with  respect to radiation to her left leg.  She has modest cardiovascular  risk factors and almost certainly does not have coronary disease.  We  will proceed with a stress echocardiogram that will save the patient  from radiation exposure and provide [place] with some structural cardiac  information.  At this point, I do not have recommendations for  empiric  therapy for her symptoms but hopefully will develop these as I get to  know her better.   With respect to her tachycardia, heart rate is acceptable at the present  time.  She does not have much history to suggest a significant  paroxysmal arrhythmia.  We will not further evaluate this at the present  time.   Thank so much for sending this nice woman to me.  I will see her again  once her stress testing has been completed.    Sincerely,      Cristopher Estimable. Lattie Haw, MD, [facility]  Electronically Signed    RMR/MedQ  DD: 05/24/2007  DT: 05/24/2007  Job #: [id number]

## 2010-09-18 NOTE — H&P (Signed)
[name], [name]                  ACCOUNT NO.:  [id number]   MEDICAL RECORD NO.:  [id number]          PATIENT TYPE:  AMB   LOCATION:  DAY                           FACILITY:  [facility]   PHYSICIAN:  Jonnie Kind, M.D. DATE OF BIRTH:  1970/12/11   DATE OF ADMISSION:  DATE OF DISCHARGE:  LH                                HISTORY & PHYSICAL   ADMISSION DIAGNOSIS:  Severe pelvic discomfort, suspected pelvic adhesions,  admitted for a total abdominal hysterectomy, right salpingo-oophorectomy and  appendectomy.   HISTORY OF PRESENT ILLNESS:  This 40 year old female is admitted for total  abdominal hysterectomy, right salpingo-oophorectomy and appendectomy. 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 Unfortunately the  patient's pain symptomatology which is primarily right sided in nature is  persistent and not tolerable to the patient. She presents to our office. A  review of her recent pathology report described a normal appearing uterus  and right tube and ovary with the patient unfortunately having persistent  pain which is right sided in its predominance. There are two components to  the pain, one is pain in the incisional area on the right side which is  considered related to fibrosis of the Pfannenstiel incision. This will be  addressed as a part of the entry and we will close the fascia layer with two  separate layers of closure. The second problem is a deep pelvic pain. The  patient has accepted that hysterectomy would completely eliminate any future  childbearing. She has been offered the option of returning if Dr. Deatra Ina  declines this. She also gives positive review of systems of dyspareunia  related to deep penetration. She declines ovarian suppression due to  headaches resulting from birth control pills when tried in the past. Options  of continued observation are declined by the  patient. Plans are for  appendectomy to rule out potential for recurrent diagnostic procedures.   Review of systems also notable for low back pain and radicular pain in the  right leg resulting from it. She had an MRI of the back June 22, 2004 at  [facility] which showed questionable small left foraminal disk  protrusion with no neural compression. This was the original discovery of  the ovarian mass. Review of systems notable for normal bowel function. She  does notice some discomfort with voiding since her last surgery. The  dyspareunia has existed since before her back injury in February.   PAST MEDICAL HISTORY:  Otherwise benign.   PAST SURGICAL HISTORY:  C section in 1996, left salpingo-oophorectomy May  2006.   ALLERGIES:  PENICILLIN and CODEINE.   MEDICATIONS:  Xanax.   PHYSICAL EXAMINATION:  VITAL SIGNS:  Height 5 feet 6, weight 130. Blood  pressure 126/70.  GENERAL:  Somber, mildly uncomfortable, Caucasian female alert and oriented  x3.  HEENT:  Pupils equal, round and reactive. Extraocular movements intact.  CHEST:  Clear to auscultation.  ABDOMEN:  Well healed Pfannenstiel incision with tenderness  at the right  edge of the incision. Trigger point injection resulted in moderate  improvement in pain but did not completely relieve the discomfort. There is  a deep pain component not addressed by the incisional pain.   LABORATORY DATA:  Hemoglobin 14.6, hematocrit 45, white count 7800, sed rate  7. Potassium 4.2, glucose 83, BUN 6, creatinine 1.0.   PLAN:  Total abdominal hysterectomy, right salpingo-oophorectomy,  appendectomy, and exploration of trigger point on old Pfannenstiel incision  planned for November 26, 2004.       JVF/MEDQ  D:  11/26/2004  T:  11/26/2004  Job:  [id number]

## 2010-09-18 NOTE — Discharge Summary (Signed)
[name], [name]                  ACCOUNT NO.:  [id number]   MEDICAL RECORD NO.:  [id number]          PATIENT TYPE:  INP   LOCATION:  G891                          FACILITY:  [facility]   PHYSICIAN:  Florian Buff, M.D.   DATE OF BIRTH:  07/21/1970   DATE OF ADMISSION:  11/26/2004  DATE OF DISCHARGE:  07/29/2006LH                                 DISCHARGE SUMMARY   DISCHARGE DIAGNOSES:  Status post total abdominal hysterectomy, right  salpingo-oophorectomy, left salpingectomy, appendectomy and excision of  cicatrix.  Surgeon was Dr. Glo Herring.   HISTORY AND PHYSICAL:  Please refer to the transcribed history and physical  and the operative report for details of admission to hospital.   HOSPITAL COURSE:  Patient was admitted after surgery.  Her postoperative  course unremarkable.  She did have emesis x2 on July 28 and was kept  overnight on July 28.  Her postoperative day #1 hemoglobin and hematocrit  were 12.1 and 34.4 and white count was 8600.  She remained afebrile  throughout postoperative course with stable vital signs.  She tolerated  progression of clear liquids and regular diet.  She voided without symptoms,  was ambulatory, abdominal exam was benign and incision was clean, dry, and  intact.  She was discharged to home on the morning of postop day #2 in good  stable condition to follow up in the office next Wednesday to have her  staples removed.  She was given Tylox for pain, Phenergan for nausea, and  instructions and precautions for return prior to her appointment.       LHE/MEDQ  D:  11/28/2004  T:  11/28/2004  Job:  [id number]

## 2010-09-18 NOTE — Procedures (Signed)
[name], [name]                  ACCOUNT NO.:  [id number]   MEDICAL RECORD NO.:  [id number]          PATIENT TYPE:  EMS   LOCATION:  ED                            FACILITY:  [facility]   PHYSICIAN:  Edward L. Luan Pulling, M.D.DATE OF BIRTH:  04-10-1971   DATE OF PROCEDURE:  08/11/2004  DATE OF DISCHARGE:  08/11/2004                                EKG INTERPRETATION   TIME:  11:30   RESULTS:  The rhythm is a sinus rhythm with a rate in the 70s.  There is a  rightward axis and there is an incomplete right bundle branch block.  There  is possible left atrial enlargement.   IMPRESSION:  Minimally abnormal electrocardiogram.      ELH/MEDQ  D:  08/12/2004  T:  08/13/2004  Job:  [id number]

## 2010-09-18 NOTE — Discharge Summary (Signed)
[name], PERKOVICH NO.:  [id number]   MEDICAL RECORD NO.:  [id number]          PATIENT TYPE:  MAT   LOCATION:  MATC                          FACILITY:  [facility]   PHYSICIAN:  Alden Hipp, M.D.   DATE OF BIRTH:  09/25/1970   DATE OF ADMISSION:  09/17/2004  DATE OF DISCHARGE:  09/18/2004                                 DISCHARGE SUMMARY   FINAL DIAGNOSES:  Torsed right adnexal mass.   SECONDARY DIAGNOSES:  None.   PROCEDURES:  Dictation ended at this point.       RK/MEDQ  D:  09/18/2004  T:  09/19/2004  Job:  [id number]

## 2010-09-18 NOTE — Op Note (Signed)
[name], [name]                  ACCOUNT NO.:  [id number]   MEDICAL RECORD NO.:  [id number]          PATIENT TYPE:  AMB   LOCATION:  DAY                           FACILITY:  [facility]   PHYSICIAN:  Jonnie Kind, M.D. DATE OF BIRTH:  1970-05-17   DATE OF PROCEDURE:  11/26/2004  DATE OF DISCHARGE:                                 OPERATIVE REPORT   CONTINUATION:  Continuing the previous dictation, we were then able to  address appendectomy by identifying the appendix on the lateral aspect of  the cecum. The appendix was rather short in length but had a firm solid  nodule 1 cm transversely, 5.5 cm x 1.5 cm in length which was immobile. It  took quite a bit time to confirm that it was a fecalith that was densely  impacted in this appendiceal stump. We eventually were able to massage it  sufficiently to expel it from the appendiceal stump. The periappendiceal  adhesions were cut free, and then standard appendectomy performed by using  Kelly clamps across the mesoappendix which was separated into three  pedicles, transecting these, then placing two Kelly clamps across the  appendiceal stump, removing the appendix and then ligating the appendiceal  stump with 0 chromic and then placing a purse-string suture of 2-0 chromic  to reinforce the closure. The area was irrigated and inspected for  hemostasis, then satisfactory closure confirmed.   At this time, we reinspected the pelvis, found bleeding in the space of  Retzius, and paid attention to this and finally achieved adequate  hemostasis. The bladder flap was inspected as well. At this point, the  procedure was considered satisfactorily completed inside the abdomen, so the  peritoneum was closed with a running 2-0 chromic and the fascia closed with  0 Vicryl. We paid particular effort to mobilize the fascia off the  underlying musculature in hopes that we could reduce her residual right  lower quadrant abdominal wall pain. Similarly, the  fatty tissue just above  the fascia was cut free from the fascia so it could be pulled over the  incision without the fibrosis that was previously there. Skin edges were  then approximated with staples after 2-0 plain had been used in interrupted  fashion to reapproximate the subcutaneous fatty tissue. The patient  tolerated the procedure well and went to recovery room in good condition.  Sponge and needle counts were correct.       JVF/MEDQ  D:  11/26/2004  T:  11/26/2004  Job:  [id number]

## 2010-09-18 NOTE — Consult Note (Signed)
[name], [name]                  ACCOUNT NO.:  [id number]   MEDICAL RECORD NO.:  [id number]          PATIENT TYPE:  INP   LOCATION:  A302                          FACILITY:  [facility]   PHYSICIAN:  R. Garfield Cornea, M.D. DATE OF BIRTH:  Jun 21, 1970   DATE OF CONSULTATION:  10/07/2005  DATE OF DISCHARGE:                                   CONSULTATION   REASON FOR CONSULTATION:  Nausea, vomiting, abdominal pain and questionable  colitis on CT.   HISTORY OF PRESENT ILLNESS:  [name] is a 40 year old Caucasian female who  states she developed severe right lower quadrant pain Wednesday evening  while eating supper. She describes the pain as aching and constant. The  patient is 10/10 on a pain scale. It doubled her over. She then began to  have nausea and vomiting yesterday morning around 8:30 a.m. She had a  syncopal episode. Yesterday morning, she did have two semiformed stools. She  states that she has had no diarrhea. She has noticed bright red blood as  well as dark stools in small amounts over the last several months. She  complains of intermittent right lower quadrant pain for the last two years.  Has had a previous workup at [facility] prior to this with a  colonoscopy in 2002 which showed polyps. She is unsure what type. She had a  normal EGD at that time for symptoms of heartburn and indigestion as well  per her report. Generally, she has chronic constipation. She has gone up to  7 to 10 days without a bowel movement. She has tried Ex-Lax, tap water  enemas, soft softeners and over-the-counter suppositories, none of which  helped much. She is on chronic narcotic therapy, takes Lortab q.i.d. She  generally consumes about five Advils a week. Denies any other NSAID use. She  denies any anorexia, fatigue. She did have low-grade temperature and some  hot flashes with this episode. White blood cell count was 10,000 on  admission down to 6000 today. CT of abdomen and pelvis with  contrast shows  descending colon bowel wall thickening to the splenic flexure.   PAST MEDICAL HISTORY:  1.  Chronic low back pain with lumbar disk disease.  2.  Cesarean section 1996.  3.  Hysterectomy with right salpingo-oophorectomy.  4.  Left salpingo-oophorectomy.  5.  Appendectomy in May 2006.  6.  Chronic constipation.  7.  Colonoscopy and EGD as described in HPI.  8.  Chronic heartburn.   MEDICATIONS PRIOR TO ADMISSION:  1.  Xanax 1 p.o. q.i.d.  2.  Prozac 40 mg daily.  3.  Premarin unknown dose.  4.  Lorcet 10/500 mg q.i.d.  5.  Advil or Aleve p.r.n.  6.  Tylenol p.r.n.   ALLERGIES:  CODEINE, MORPHINE, DILAUDID.   FAMILY HISTORY:  Positive for father deceased at age 72 with pancreatic and  hepatic carcinoma. A maternal cousin with Crohn's. Mother age 42 has  coronary artery disease and hypertension. She has one brother and two  sisters who are healthy.   SOCIAL HISTORY:  [name] is  in her second marriage. She has been married  for two years. She has an 42 year old healthy daughter. 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 She has a 15-pack-year  history of tobacco use. Denies any alcohol or drug use.   REVIEW OF SYSTEMS:  CONSTITUTIONAL:  _____________ stable. See HPI. Denies  any fatigue. CARDIOVASCULAR:  She denies any chest pain or palpitations.  PULMONARY:  She denies any shortness of breath, dyspnea, cough or  hemoptysis. GASTROINTESTINAL:  See HPI. She has had heartburn and  indigestion every day or every other day, takes an occasional Tums with no  relief. She has had history of dysphagia to solids, had previous EGD which  was unremarkable about five years ago. Denies any odynophagia. Denies any  anorexia or early satiety. EXTREMITIES:  She has had some lower extremity  edema from her knees to her ankles.   PHYSICAL EXAMINATION:  VITAL SIGNS:  Weight 126.8 pounds, height 66 inches,  temperature 97.9, pulse 77, respirations 20,  blood pressure 100/55.  GENERAL:  [name] is a 40 year old Caucasian female who is alert, oriented,  pleasant and cooperative in no acute distress.  HEENT:  Sclerae is clear, nonicteric. Conjunctivae pink. Oropharynx pink and  moist without any lesions.  NECK:  Supple without any masses or thyromegaly.  CHEST:  Heart regular rate and rhythm with normal S1 and S2 without murmurs,  clicks, rubs or gallops.  LUNGS:  Clear to auscultation bilaterally.  ABDOMEN:  Flat with positive bowel sounds x4. No bruits auscultated. Abdomen  is soft, nondistended. She does have moderate tenderness to right lower  quadrant on deep palpation, left lower quadrant on deep palpation and left  upper quadrant. There is no rebound tenderness or guarding. No  hepatosplenomegaly or mass.  RECTAL:  Deferred.  EXTREMITIES:  Without clubbing or edema bilaterally.  SKIN:  Pink, warm and dry without any rash or jaundice. She does have  ecchymosis to the left orbital area and left elbow.   LABORATORY DATA:  WBCs 6, hemoglobin 12.6, hematocrit 37.4, platelets 207.  Calcium 9.5, sodium 139, potassium 4, chloride 108, CO2 26, BUN 11,  creatinine 1, glucose 157, amylase 60, lipase 22. Urinalysis positive for  trace protein, few squamous epithelial and few bacteria.   IMPRESSION:  [name] is a 40 year old Caucasian female who presents with  acute illness of nausea, vomiting and syncopal episode with severe abdominal  pain, most in the right lower quadrant. Findings on CT suggestive of colitis  in the ascending colon. I suspect this is related to acute illness, possibly  food borne. She does have history of chronic abdominal pain and chronic  constipation with a negative workup over five years ago except for polyps on  colonoscopy. She has also had some intermittent hematochezia and is going to  need further evaluation to rule out ulcerative colitis. There was no evidence of diarrhea. Therefore, it is not likely  clostridium difficile. She  also has history of chronic GERD and has not been on PPI. This case has been  discussed with Dr. Gala Romney, and our plan of care is outlined below.   PLAN:  1.  Will treat with a 10-day course of Cipro and Flagyl.  2.  She is going to need follow up in four weeks with outpatient      colonoscopy, unless her condition does not improve with antibiotics at      which time we will proceed with colonoscopy at that point.  3.  She needs daily PPI now and with discharge home.  4.  Further recommendations to follow, and Dr. Guadalupe Dawn will be following      this weekend.   We would like to thank Dr. Luan Pulling for allowing [place] to participate in the  care of [name].      Les Pou, N.P.      Bridgette Habermann, M.D.  Electronically Signed    KC/MEDQ  D:  10/08/2005  T:  10/08/2005  Job:  [id number]

## 2010-09-18 NOTE — Op Note (Signed)
NAMEBRITTANNI, [name]                  ACCOUNT NO.:  [id number]   MEDICAL RECORD NO.:  [id number]          PATIENT TYPE:  AMB   LOCATION:  DAY                           FACILITY:  [facility]   PHYSICIAN:  R. Garfield Cornea, M.D. DATE OF BIRTH:  [birth year]   DATE OF PROCEDURE:  11/11/2005  DATE OF DISCHARGE:                                 OPERATIVE REPORT   PROCEDURE:  Diagnostic esophagogastroduodenoscopy followed by colonoscopy.   INDICATIONS FOR PROCEDURE:  [name] is a 40 year old lady with regional  abdominal cramps and bloody diarrhea.  CT scan demonstrated thickening of  the large bowel mucosa suspicious for colitis.  She also has worsening  reflux symptoms and reports intermittent esophageal dysphagia.  She was  started back on Nexium recently.  EGD and colonoscopy are now being done.  This approach has been discussed with the patient at length.  Potential  risks, benefits and alternatives have been reviewed and questions answered.  She is agreeable.  Please see documentation on the medical record.   PROCEDURE NOTE:  O2 saturation, blood pressure, pulses, and respirations  were monitored throughout the entirety of both procedures.  Conscious  sedation with Versed 8 mg IV and Demerol 200 mg IV in divided doses.   INSTRUMENT:  Olympus video chip system.   FINDINGS OF EGD:  Examination of the tubular esophagus revealed no mucosal  abnormalities.  EG junction appeared normal and was easily traversed.   STOMACH:  The gastric cavity was empty and insufflated well with air.  Thorough examination of the gastric mucosa included a retroflexed view of  the proximal stomach.  The esophagogastric junction demonstrated no mucosal  abnormalities.  The pylorus was patent and easily transversed.  Examination  of the bulb and the second portion revealed no abnormalities.   THERAPEUTICS/DIAGNOSTIC MANEUVERS PERFORMED:  None.   Patient tolerated the procedure well and was prepared for  colonoscopy.   Digital rectal exam revealed no abnormalities.   ENDOSCOPIC FINDINGS:  Prep was good.   RECTAL:  Examination of the rectal mucosa revealed a single anal papilla and  internal hemorrhoids, otherwise rectal mucosa appeared normal.  The rectal  vault was small and was unable to retroflex but for the same reason unable  to see the rectal mucosa very well.   COLON:  The colonic mucosa was surveyed from the rectosigmoid junction to  the left transverse, right colon, to the appendiceal orifice, the ileocecal  valve, and cecum.  The terminal ileum was intubated to 15 cm.  From this  level, the scope was slowly withdrawn.  All previously mentioned mucosal  surfaces were again seen.  The colonic mucosa appeared entirely normal, as  did the terminal and ileal mucosa.  The patient tolerated both procedures  well and was reactive.   ENDOSCOPY IMPRESSION:  Esophagogastroduodenoscopy:  Normal esophagus,  stomach, D1, D2.   COLONOSCOPY FINDINGS:  Anal papillae and internal hemorrhoids, otherwise  normal rectum.  Normal colon.  Normal terminal ileum.   I suspect the patient did in fact suffer a bout of infectious colitis  recently, and she  may have an element of post infectious irritable bowel  syndrome.   RECOMMENDATIONS:  1.  Continue Nexium 40 mg orally daily.  2.  Hemorrhoid literature provided to [name].  3.  A 10 day course of Anusol-HC suppositories 1 per rectum at bedtime.  4.  Levsin 1 sublingually a.c. and h.s. p.r.n. abdominal cramps.  5.  Follow-up appointment with [place] in six weeks.      Bridgette Habermann, M.D.  Electronically Signed     RMR/MEDQ  D:  11/11/2005  T:  11/11/2005  Job:  [id number]   cc:   Percell Miller L. Luan Pulling, M.D.  Fax: [phone]

## 2010-09-18 NOTE — H&P (Signed)
[name], [name]                  ACCOUNT NO.:  [id number]   MEDICAL RECORD NO.:  [id number]          PATIENT TYPE:  INP   LOCATION:  A302                          FACILITY:  [facility]   PHYSICIAN:  Edward L. Luan Pulling, M.D.DATE OF BIRTH:  27-Nov-1970   DATE OF ADMISSION:  10/07/2005  DATE OF DISCHARGE:  LH                                HISTORY & PHYSICAL   CHIEF COMPLAINT:  Abdominal pain.   HISTORY OF PRESENT ILLNESS:  This is a 40 year old who came to the emergency  room because of abdominal pain.  She has been having some problems in her  abdomen for several days.  She has had a previous hysterectomy about a year  ago and had been doing fairly well until the last week or two.  She then  developed some abdominal pain in the right side, but then today got much  worse.  The pain became much worse.  She had multiple episodes of vomiting.  She had some diarrhea and then she tried to get up and she actually passed  out and hit her head.  She came to the emergency room.  In the emergency  room, she was evaluated and had a CT abdomen which she was suggestive of  colitis and she is admitted because of that.  She is also complaining of  headache.   PAST MEDICAL HISTORY:  1.  Chronic low back pain.  2.  Lumbar disk disease.  3.  Ovarian abnormality which has been removed with a hysterectomy.  4.  She had a C-section in 1996.  5.  Left salpingo-oophorectomy in May 2006.  6.  Abdominal hysterectomy with right salpingo-oophorectomy, left      salpingectomy, appendectomy and excision of cicatrix in July 2006.   ALLERGIES:  CODEINE, MORPHINE AND DILAUDID.   MEDICATIONS:  She takes Xanax at home.   FAMILY HISTORY:  Her family history is positive for tachycardias and COPD.   SOCIAL HISTORY:  She does not drink any alcohol.  She does not use illicit  drugs.  She does use cigarettes.  She also takes Prozac 40 mg daily.   PHYSICAL EXAMINATION:  GENERAL:  She looks pretty comfortable now.  HEENT:   She does have a bruise on her forehead.  Her pupils are reactive.  Nose and throat are clear.  NECK:  Neck is supple without masses.  CHEST:  Her chest is clear without wheezes, rales or rhonchi.  Mucous  membranes are dry.  HEART:  Her heart is regular without gallop, no murmur.  ABDOMEN:  Her abdomen is soft, mildly tender diffusely.  She has normal  bowel sounds.  EXTREMITIES:  Her extremities showed no edema.  CNS is grossly intact.  VITAL SIGNS:  Temperature is 97.9, pulse 90, respirations 20, blood pressure  135/69.   LABORATORY DATA AND X-RAY FINDINGS:  White count is 10,100, hemoglobin 14.1,  90% neutrophils and platelets 221.  Electrolytes are normal except for a  glucose of 157.  Amylase and lipase were both normal.  Her urine is negative  except for a few squamous  epithelials and a few bacteria.   ASSESSMENT:  She has abdominal discomfort and questionable colitis.   PLAN:  Plan is to admit and try to get her pain under control and ask for GI  consultation.  She has not been on recent antibiotics, so I do not think  this represents C. difficile colitis.      Edward L. Luan Pulling, M.D.  Electronically Signed     ELH/MEDQ  D:  10/07/2005  T:  10/07/2005  Job:  [id number]

## 2010-09-18 NOTE — Group Therapy Note (Signed)
NAMEAHMYA, BERNICK                  ACCOUNT NO.:  [id number]   MEDICAL RECORD NO.:  [id number]          PATIENT TYPE:  INP   LOCATION:  A302                          FACILITY:  [facility]   PHYSICIAN:  Edward L. Luan Pulling, M.D.DATE OF BIRTH:  1970/07/09   DATE OF PROCEDURE:  10/08/2005  DATE OF DISCHARGE:                                   PROGRESS NOTE   PROBLEM:  Abdominal pain.   SUBJECTIVE:  [name] states she has had significant abdominal pain  yesterday but she is pain free now.  She has had no nausea, vomiting or  diarrhea.  She still has some headache from her fall.   Her physical examination today shows her chest is clear.  Her heart is  regular.  She is afebrile.  Her abdomen is very soft.  No masses are felt.  Her chest is clear.   ASSESSMENT:  She has what looks like colitis by x-ray criteria and I am  going to wait and see what the GI team thinks about this, but basically she  does look a great deal better than yesterday.  I do not plan to change any  medicines until we see what all that shows.      Edward L. Luan Pulling, M.D.  Electronically Signed     ELH/MEDQ  D:  10/08/2005  T:  10/08/2005  Job:  [id number]

## 2010-09-18 NOTE — Op Note (Signed)
[name], [name]                  ACCOUNT NO.:  [id number]   MEDICAL RECORD NO.:  [id number]          PATIENT TYPE:  AMB   LOCATION:  DAY                           FACILITY:  [facility]   PHYSICIAN:  Jonnie Kind, M.D. DATE OF BIRTH:  02-17-1971   DATE OF PROCEDURE:  11/26/2004  DATE OF DISCHARGE:                                 OPERATIVE REPORT   PREOPERATIVE DIAGNOSIS:  Pelvic pain, suspect pelvic adhesions.   POSTOPERATIVE DIAGNOSIS:  Pelvic pain, abdominal wall fibrosis, fecalith.   PROCEDURE:  Total abdominal hysterectomy, right salpingo-oophorectomy, left  salpingectomy, appendectomy, and excision of cicatrix.   SURGEON:  Jonnie Kind, M.D.   ASSISTANT:  Caryl Pina, R.N.   ANESTHESIA:  General.   COMPLICATIONS:  None.   FINDINGS:  1.  Extreme fibrosis in the old scar and the subcutaneous fatty tissue and      fascial layer with dense adherence to underlying rectus muscles.  2.  Some filmy adhesions in the area of the appendix.  3.  Large fecalith 2 cm x 1 cm in the appendix base.  4.  Upper limits normal sized uterus with no adhesions in the area of the      right tube and ovary.   DETAILS OF PROCEDURE:  The patient was taken to the operating room, prepped  and draped for lower abdominal surgery with vaginal prep and Foley catheter  in place. The old Pfannenstiel incision was excised, taking out a 1-cm  ellipse of skin and underlying fatty tissue and dense fibrosis which was  trimmed off of the underlying fascia with some difficulty. We then repeated  the Pfannenstiel opening at the lower abdomen with dense fibrosis to the  underlying rectus muscles. We mobilized all the way down to the space of  Retzius and upward sufficiently to allow midline entry into the peritoneal  cavity using careful dissection with no intra-abdominal adhesions  encountered to the abdominal wall. We then were able to pack the bowel away  with Balfour retractor in place and careful attention  made to make sure that  the retractor did not touch the retroperitoneal strictures. A rolled green  towel was placed beneath the rotator above the patient's skin. We then  proceeded with grasping the uterus with Lahey thyroid tenaculum, doubly  ligating each round ligament and transecting between and then taking down  the bladder flap. The bladder flap was quite densely adherent to the lower  uterine segment due to the old Cesarean section fibrosis. This was taken  down sharply with good results. The right infundibulopelvic ligament was  isolated, no adhesions encountered, and the broad ligament penetrated the  infundibulopelvic ligament, cross clamped as was the utero-ovarian ligament  and the right adnexa removed. IP ligament was then ligated and hemostasis  confirmed as was the other utero-ovarian ligament. We then inspected the  left adnexa and were able to cross clamp the remnants of the broad ligament,  taking out the left tube in similar fashion. We then skeletonized the  uterine vessels on either side. Curved Heaney clamps were placed across the  uterine vessels, and then the vessels transected along with remnants of the  broad ligament and suture ligated with 0 chromic. The upper and lower  cardinal ligaments and the remnants of the broad ligament were clamped in  series with small bites with the straight Heaney clamps followed by [facility]  scissor transection and 0 chromic suture ligature. Upon marching down to the  level of the cervix, which was rather large, 3.5 to 4 cm transverse  diameter, a stab incision was made in the anterior cervical vaginal fornix  and the cervix amputated off the vaginal cuff. Kocher clamps were used at  four sites to hold the vaginal cuff edges. Aldrich stitches were placed at  each lateral vaginal angle to attach the cuff to the lower cardinal  ligaments laterally. The vaginal cuff was then over sewn with interrupted 0  chromic sutures with a small pouch  developed in the posterior aspect of the  vaginal cuff to affect vaginal length. Hemostasis was satisfactory after  point cautery was used in the vaginal cuff couple of spots. The bladder was  carefully inspected and found to be adequately hemostatic. No cautery of the  bladder was necessary or utilized. We then irrigated copiously, inspected  the pelvis, using point cautery as necessary to confirm final hemostasis.  Bladder flap was loosely reapproximated with three interrupted 2-0 chromic  sutures. Laparotomy tapes were removed, and the appendix inspected.       JVF/MEDQ  D:  11/26/2004  T:  11/26/2004  Job:  [id number]

## 2010-09-18 NOTE — Discharge Summary (Signed)
[name], [name]                  ACCOUNT NO.:  [id number]   MEDICAL RECORD NO.:  [id number]          PATIENT TYPE:  INP   LOCATION:  A302                          FACILITY:  [facility]   PHYSICIAN:  Edward L. Luan Pulling, M.D.DATE OF BIRTH:  August 14, 1970   DATE OF ADMISSION:  10/07/2005  DATE OF DISCHARGE:  06/09/2007LH                                 DISCHARGE SUMMARY   FINAL DISCHARGE DIAGNOSES:  1.  Abdominal pain.  2.  CT evidence of colitis.  3.  Chronic low back pain with lumbar disk disease.  4.  History of hysterectomy for ovarian abnormality.  5.  History of left salpingo-oophorectomy in May 2006 and abdominal      hysterectomy with right salpingo-oophorectomy, left salpingectomy,      appendectomy and excision of cicatrix in July of 2006.  6.  Anxiety.  7.  Syncope.   HISTORY:  This is a 40 year old who came to the emergency room because of  abdominal pain.  She had been having some problems for several days, then  she got much worse, had multiple episodes of vomiting.  She actually fell,  passed out and hit her head. She came to the emergency room.  She was  evaluated, had a CT of the abdomen which was suggestive of colitis of which  she has had no previous episodes.   PHYSICAL EXAMINATION:  HEENT:  Her physical examination showed that she had  a bruise on the forehead.  Pupils were reactive.  She was alert and  oriented.  ABDOMEN:  Her abdomen was soft, mildly tender diffusely with normal bowel  sounds.   LABORATORY DATA:  White count 10,100, hemoglobin 14.1 with 90% neutrophils,  platelets of 221.  Glucose 157, amylase and lipase were both normal.  CT  showed a questionable colitis.   HOSPITAL COURSE:  She was treated with Cipro and Flagyl and had GI  consultation.  She showed marked improvement and was discharged home to  continue with Cipro 500 mg b.i.d., Flagyl 250 mg t.i.d. and then she is  going to have follow-up in the GI office and will need to have an outpatient  colonoscopy.  She is going to continue Prozac 20 mg daily and Xanax 1 mg  q.i.d. p.r.n. anxiety which are chronic medications.      Edward L. Luan Pulling, M.D.  Electronically Signed     ELH/MEDQ  D:  10/13/2005  T:  10/13/2005  Job:  [id number]

## 2010-09-18 NOTE — H&P (Signed)
[name], [name] NO.:  [id number]   MEDICAL RECORD NO.:  [id number]          PATIENT TYPE:  MAT   LOCATION:  MATC                          FACILITY:  [facility]   PHYSICIAN:  Alden Hipp, M.D.   DATE OF BIRTH:  10-16-1970   DATE OF ADMISSION:  09/16/2004  DATE OF DISCHARGE:                                HISTORY & PHYSICAL   CHIEF COMPLAINT:  Pelvic pain, uterine myoma.   HISTORY OF PRESENT ILLNESS:  This is a 40 year old gravida 2 para 28 AB 1  female who presented to the office approximately one month ago with moderate  to severe, intermittent pelvic pain.  Evaluation by pelvic exam and  ultrasound revealed the presence of  what appeared to be a 7-cm,  pedunculated myoma in the right cul de sac.  The patient was scheduled for  surgery for the removal of this fibroid after it was clear that the pain was  persistant and increasing in severity.  The pain became severe yesterday,  and the surgery was moved up to today to relieve the patient's severe  discomfort.   PAST MEDICAL HISTORY:  The patient has had a C-section and a D&E for early  pregnancy.  The patient has no other surgeries.  The patient has no medical  problems.  The patient is on Percocet pain relief at the present time.  No  other medication.  The patient has drug allergies to penicillin and to  codeine.   SOCIAL HISTORY:  The patient smokes one pack of cigarettes a day.  The  patient does not use street drugs.  The patient drinks only socially.   FAMILY HISTORY:  Noncontributory.   REVIEW OF SYSTEMS:  Negative.   PHYSICAL EXAMINATION:  She is afebrile.  Pulse is 80, blood pressure 110/70.  Ears, nose, and throat are normal.  Neck is supple without thyromegaly.  Her  chest is clear to auscultation and percussion.  Her heart is regular sinus  rhythm without murmurs, gallops, or arrhythmias.  Her abdomen is soft.  There is some mild lower abdominal tenderness but no rebound.  Back is  without  CVA tenderness.  Pelvic exam:  Cervix is clean without discharge.  The cul-de-sac and slightly to the right is a very tender pelvic mass which  on ultrasound appears to be a fibroid.  Extremities are benign.   LABORATORY DATA:  Hemoglobin of 14.2 with white count of 6.8.  Ultrasound  done on 5/17 revealed persistent solid pelvic mass which appeared to be a  fibroid based upon texture.  However, no definite connection between the  mass and the uterus could be defined.  The uterine cavity appeared normal.  The ovaries were identified bilaterally to be normal.   IMPRESSION:  This is a 40 year old woman who desires to maintain her  fertility with a severely painful fibroid, possibly necrotic, causing a  tremendous amount of pelvic pain requiring narcotic analgesia.   Hospital plan is to do a laparotomy with a removal of this tender pelvic  mass.  The patient understands that if necessary, a  hysterectomy will be  performed as well.      RK/MEDQ  D:  09/17/2004  T:  09/17/2004  Job:  [id number]

## 2010-09-18 NOTE — Discharge Summary (Signed)
[name], [name]                  ACCOUNT NO.:  [id number]   MEDICAL RECORD NO.:  [id number]          PATIENT TYPE:  INP   LOCATION:  9302                          FACILITY:  [facility]   PHYSICIAN:  Alden Hipp, M.D.   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   SECONDARY DIAGNOSES:  None.   PROCEDURES:  Abdominal laparotomy with removal of the left ovary and  associated mass and removal of the hydatid cyst from the right fallopian  tube.   COMPLICATIONS:  None.   CONDITION ON DISCHARGE:  Improved.   HISTORY OF PRESENT ILLNESS:  This is a 40 year old gravida 3, para 1-0-2-1  who was noted to have a pelvic mass approximately two months prior to  admission.  The mass at that time was asymptomatic.  However, over the  ensuing months, the patient had multiple episodes of increasingly severe  lower abdominal pain.  On ultrasoun the mass appeared to be a pedunculated  uterine myoma with both ovaries appearing normal.  The patient developed  another severe episode of pain and decision was made to proceed with  emergency laparotomy.  The patient was taken to the operating room on the  day of admission where an abdominal laparotomy was performed where a  slightly torsed, dense, left ovarian mass was appreciated.  This was not  involving the fallopian tube, and the mass and the ovary were removed  intact.  Also noted on the right fallopian tube was a small hydatid cyst  which was also removed.  The patient's postoperative course was entirely  benign without significant fever or anemia, and she was felt to be ready to  be discharged.  At that point, she was eating well, voiding without  difficulty, passing flatus and her pain was well controlled with oral  analgesia.  She was therefore  discharged on postop day #1.  She was  discharged on a regular diet.  Told to limit her activity.  She was given  Tylox 25 tablets to take 1-2 q.4h. p.r.n. pain, Phenergan 25 mg tablets to  take one q.4h. p.r.n. nausea.  She was asked to return to the office in two  weeks for follow up evaluation.   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  Pelvic washings were benign.  The hydatid cyst on the right  fallopian tube was also benign.      RK/MEDQ  D:  09/18/2004  T:  09/19/2004  Job:  [id number]

## 2010-09-18 NOTE — Consult Note (Signed)
[name], [name]                  ACCOUNT NO.:  [id number]   MEDICAL RECORD NO.:  [id number]          PATIENT TYPE:  INP   LOCATION:  A302                          FACILITY:  [facility]   PHYSICIAN:  R. Garfield Cornea, M.D. DATE OF BIRTH:  Nov 27, 1970   DATE OF CONSULTATION:  10/07/2005  DATE OF DISCHARGE:  10/09/2005                                   CONSULTATION   REASON FOR CONSULTATION:  Followup hospitalization for colitis.   HPI:  [name] is a 40 year old Caucasian female who was admitted to [facility].  We saw her on October 07, 2005.  She had severe right lower  quadrant pain with nausea and vomiting.  She also noticed blood in her  stools.  She had a CT scan of the abdomen and pelvis with contrast which  showed descending colon, had bowel wall thickening to the splenic flexure.  She was treated with a 10-day course of Cipro and Flagyl.  She did well at  that time.  She was discharged home.  She states she continues to have  alternating constipation and diarrhea.  She can go up to 2 weeks at a time  without a bowel movement and then she will have episodes where she goes  several days with loose watery stools.  She has been noticing a small amount  of bright red blood in her panties as well.  She has had nausea and vomiting  at least twice a week.  She has chronic heartburn, indigestion.  She  complains of dysphagia to both solids and liquids.  She was previously on  Nexium 40 mg daily, she has not been taking this recently.   She has a history of chronic GERD.  She has daily heartburn, indigestion.  She also has complaints of nausea and vomiting at least twice a week along  with some solid food dysphagia.  She is going to need EGD with Dr. Gala Romney in  the near future for this as well to rule out complicated gastroesophageal  reflux disease including the development of __________ or stricture.   PAST MEDICAL HISTORY:  1.  Anxiety.  2.  Borderline diabetes.  3.  Chronic low  back pain with lumbar disk disease.  4.  C-section in 1996.  5.  Hysterectomy with right salpingo-oophorectomy and left salpingo-      oophorectomy, appendectomy in May, 2006.  6.  Chronic constipation.  7.  Colonoscopy in 2002 with polyps of unknown etiology.  EGD at the same      time which she reports was normal.  8.  She has a history of chronic heartburn.   CURRENT MEDICATIONS:  1.  Xanax 1 mg q.i.d.  2.  Lorcet Plus q.i.d.   ALLERGIES:  1.  CODEINE.  2.  MORPHINE.  3.  DILAUDID.  4.  PENICILLIN.   FAMILY HISTORY:  Is positive for father deceased at 31 with pancreatic and  hepatic carcinoma, maternal cousin with Crohn's, mother __________, history  of coronary artery disease and hypertension, one brother and two sisters who  are healthy.  SOCIAL HISTORY:  [name] is in her second marriage for the last 2 years.  She has an 49 year-old daughter.  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  She has a 15 pack year history of tobacco use,  denies any alcohol or drug use.   REVIEW OF SYSTEMS:  CONSTITUTIONAL:  See HPI.  Denies any fever or chills.  CARDIOVASCULAR:  Denies any chest pain or palpitation.  PULMONARY:  Denies  shortness of breath, dyspnea, cough or hemoptysis.  GI:  See HPI.   PHYSICAL EXAM:  VITAL SIGNS:  Weight 129.5 pounds, height 66-1/2 inches,  temp 98.3, blood pressure 106/74, pulse 96.  GENERAL:  [name] is a 40 year old Caucasian female who is alert,  oriented, pleasant, cooperative, in no acute distress.  HEENT:  Sclera clear, nonicteric, conjunctiva pink.  Oropharynx pink and  moist without any lesions.  NECK:  Supple without masses or thyromegaly.  CHEST:  Heart rate regular rhythm, normal S1, S2, without any murmurs,  thrills, rubs or gallops.  LUNGS:  Clear to auscultation bilaterally.  ABDOMEN:  Positive bowel sounds x4.  No bruits auscultated.  She does have  tenderness to the right lower quadrant and left lower  quadrant on deep  palpation, left greater than right.  There is no rebound tenderness or  guarding, no hepatosplenomegaly or mass.  RECTAL:  Deferred.  EXTREMITIES:  Without clubbing or edema bilaterally.   ASSESSMENT:  [name] is a 40 year old Caucasian female with a history of  colitis who was hospitalized October 08, 2005, felt to be possibly infectious.  She responded well to a course of Cipro and Flagyl.  She continues to  complain of alternating constipation and diarrhea with symptoms suspicious  for irritable bowel syndrome; however, we do need to proceed on with  colonoscopy to rule out ulcerative colitis.  Her symptoms are definitely  worrisome given her intermittent hematochezia.   She has had insomnia recently.  I have asked her to talk with Dr. Luan Pulling  regarding this.   PLAN:  1.  Resume Nexium 40 mg daily.  We have given her two weeks samples and a      prescription for 30 without refills.  2.  Colonoscopy and EGD to be scheduled with Dr. Gala Romney in the near future.      I discussed both procedures including risks and benefits which include,      but are not limited to, bleeding, infection, perforation, drug      reaction.  She agrees with this plan and consent will be obtained.  3.  Further recommendations pending procedure.   I would like to thank Dr. Luan Pulling for allowing [place] to participate in the care  of [name].      Les Pou, N.P.      Bridgette Habermann, M.D.  Electronically Signed    KC/MEDQ  D:  11/02/2005  T:  11/02/2005  Job:  [id number]

## 2010-09-18 NOTE — Procedures (Signed)
   [name], [name]                        ACCOUNT NO.:  [id number]   MEDICAL RECORD NO.:  [id number]                   PATIENT TYPE:  EMS   LOCATION:  ED                                   FACILITY:  [facility]   PHYSICIAN:  Edward L. Luan Pulling, M.D.             DATE OF BIRTH:  June 20, 1970   DATE OF PROCEDURE:  DATE OF DISCHARGE:  12/24/2002                                EKG INTERPRETATION   EKG on [facility], actually there are two of them.   The first one is timed 1648 on December 24, 2002.  The rhythm is normal sinus  rhythm with a rate in the 60s.  The axis is rightward.  There is an  incomplete right bundle branch block.  There are Q waves laterally which may  be due to previous infarction but could well be due to the incomplete right  bundle branch block.  Clinical correlation is suggested.  Abnormal  electrocardiogram.   At 1719 on December 24, 2002.  The rhythm is a sinus rhythm, rate in the 60s.  There is an incomplete right bundle branch block.  The previously noted Q  waves laterally are less prominent but there is slow R wave progression  across the precordium which could indicate a previous anterior myocardial  infarction.  Clinical correlation is suggested.  Abnormal electrocardiogram.                                               Jasper Loser. Luan Pulling, M.D.    ELH/MEDQ  D:  12/25/2002  T:  12/25/2002  Job:  [id number]

## 2010-09-18 NOTE — Op Note (Signed)
NAMEGENESIS, NOVOSAD                  ACCOUNT NO.:  [id number]   MEDICAL RECORD NO.:  [id number]          PATIENT TYPE:  INP   LOCATION:  9399                          FACILITY:  [facility]   PHYSICIAN:  Alden Hipp, M.D.   DATE OF BIRTH:  01-15-1971   DATE OF PROCEDURE:  09/17/2004  DATE OF DISCHARGE:                                 OPERATIVE REPORT   PREOPERATIVE DIAGNOSIS:  Pedunculated and painful uterine myoma.   POSTOPERATIVE DIAGNOSIS:  Partially torsed left ovary and 7 cm left ovarian  fibroma.   OPERATION PERFORMED:  Left oophorectomy.   SURGEON:  Alden Hipp, M.D.   ASSISTANT:  Lenard Galloway, M.D.   ANESTHESIA:  General.   ESTIMATED BLOOD LOSS:  100 cc.   FINDINGS:  A normal-appearing uterus, normal appearing right tube and ovary  with a 2 cm hydatid cyst near the ampullary portion of the right tube.  Normal-appearing pelvis and appendix.  Normal kidneys bilaterally and liver  edge.  There was a 7 cm ovarian fibroma that was partially torsed into the  cul-de-sac.   SPECIMENS:  Left ovary and left ovarian fibroma, hydatid cyst from right  fallopian tube, pelvic washings.   INDICATIONS FOR PROCEDURE:  This is a 40 year old gravida 3, para 1-0-2-1  who noted the onset of intermittent severe pelvic pain for approximately  eight weeks.  Prior to this, the patient was noted to have ovarian tumor at  the time of a dilation and evacuation in early pregnancy.  Because of the  severity of the pain which was not controlled with narcotics and the  presence of the tumor, decision was made to proceed with laparotomy and  removal of the tumor which was felt to be preoperatively a pedunculated  uterine myoma.   DESCRIPTION OF PROCEDURE:  The patient was taken to the operating room and  placed in supine position.  General anesthesia was induced, the abdomen was  prepped and draped in sterile fashion, the bladder was catheterized.  A low  transverse incision was made and carried  down to the fascia which was  extended transversely.  The fascia was dissected free from the underlying  rectus muscle.  The rectus muscle then divided in the midline and the  peritoneum was entered sharply and extended vertically. The abdomen was then  explored, the kidneys were palpated on both sides and the liver edge was  smooth.  In the pelvis, the cul-de-sac mass was freed during palpation and  brought up through the incision.  It is clear that this mass encompassed the  entire ovary.  The fallopian tube was perfectly normal.  The mass appeared  to be partially torsed but no necrosis could grossly be seen.  The right  adnexa was examined and the tube and ovary appeared to be perfectly normal.  The uterus appeared to be perfectly normal.  Elevating the mass which was on  a stalk, the infundibulopelvic ligament at the base of the mass was then  clamped and ligated with 0 Vicryl suture.  Prior to this, pelvic washings  had been obtained.  The mass was then sent off to pathology.  The meso-  ovarian and infundibulopelvic ligament was dry.  The fallopian tube was  intact on the left side and was not removed.  A small hydatid cyst on the  right tube was freed with cautery and removed.  The peritoneum  was then closed with running 3-0 Vicryl suture, the fascia was closed with 0  Vicryl suture and the skin was closed with subcuticular 4-0 Dexon suture.  The patient tolerated the procedure well and left the operating room in good  condition.      RK/MEDQ  D:  09/17/2004  T:  09/17/2004  Job:  [id number]

## 2010-09-22 ENCOUNTER — Ambulatory Visit (INDEPENDENT_AMBULATORY_CARE_PROVIDER_SITE_OTHER): Payer: Medicaid Other | Admitting: Cardiology

## 2010-09-22 ENCOUNTER — Encounter: Payer: Self-pay | Admitting: Cardiology

## 2010-09-22 DIAGNOSIS — R079 Chest pain, unspecified: Secondary | ICD-10-CM

## 2010-09-22 DIAGNOSIS — R002 Palpitations: Secondary | ICD-10-CM

## 2010-09-22 DIAGNOSIS — F17200 Nicotine dependence, unspecified, uncomplicated: Secondary | ICD-10-CM

## 2010-09-22 DIAGNOSIS — I471 Supraventricular tachycardia: Secondary | ICD-10-CM

## 2010-09-22 DIAGNOSIS — M79609 Pain in unspecified limb: Secondary | ICD-10-CM

## 2010-09-22 DIAGNOSIS — M79606 Pain in leg, unspecified: Secondary | ICD-10-CM

## 2010-09-22 DIAGNOSIS — K589 Irritable bowel syndrome without diarrhea: Secondary | ICD-10-CM

## 2010-09-22 DIAGNOSIS — R413 Other amnesia: Secondary | ICD-10-CM

## 2010-09-22 DIAGNOSIS — K219 Gastro-esophageal reflux disease without esophagitis: Secondary | ICD-10-CM

## 2010-09-22 NOTE — Patient Instructions (Addendum)
Your physician recommends that you schedule a follow-up appointment in:1 year Your physician has requested that you have an ankle brachial index (ABI). During this test an ultrasound and blood pressure cuff are used to evaluate the arteries that supply the arms and legs with blood. Allow thirty minutes for this exam. There are no restrictions or special instructions.  Referral to Dr. Erling Cruz for neurology consult- memory  impairment

## 2010-09-23 ENCOUNTER — Other Ambulatory Visit: Payer: Self-pay | Admitting: Cardiology

## 2010-09-23 DIAGNOSIS — M79606 Pain in leg, unspecified: Secondary | ICD-10-CM

## 2010-09-23 NOTE — Assessment & Plan Note (Signed)
Resolved for the time being.  No evidence for coronary disease on stress echocardiography.

## 2010-09-23 NOTE — Assessment & Plan Note (Signed)
Controlled with beta blocker.  Appear to be related to sinus tachycardia and perhaps anxiety.

## 2010-09-23 NOTE — Assessment & Plan Note (Signed)
Patient is congratulated on her efforts to reduce cigarette smoking so far.  She refuses any pharmacologic assistance and assures me that she will completely discontinue tobacco use.

## 2010-09-23 NOTE — Progress Notes (Signed)
HPI : [name] returns to the office as scheduled for continued assessment and treatment of palpitations.  She has noted progressive improvement with a gradual increase in her dose of metoprolol, currently 150 mg q.d., and is now experiencing minimal and very tolerable symptoms.  She notes a number of additional problems including paresthesias in a stocking and glove distribution that feel as if her hands and feet are asleep.  These occur when she is at rest, but not necessarily during sleep.  She also notes coldness in her extremities, which is relieved by movement.  She also describes memory impairment.  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  She found herself in a nearby town when she was actually headed for [employer] and did not know how she got there or how to return to her home.  In the way of good news, patient reports reducing cigarette smoking to1/5 pack per day with a general improvement in her sense of well-being.  Current Outpatient Prescriptions on File Prior to Visit  Medication Sig Dispense Refill  . ALPRAZolam (XANAX) 1 MG tablet Take 1 mg by mouth at bedtime as needed.        . cyclobenzaprine (FLEXERIL) 10 MG tablet Take 10 mg by mouth 2 (two) times daily as needed.       Marland Kitchen esomeprazole (NEXIUM) 40 MG capsule Take 40 mg by mouth daily before breakfast.        . HYDROcodone-acetaminophen (NORCO) 10-325 MG per tablet Take 1 tablet by mouth every 4 (four) hours as needed.        . hyoscyamine (LEVSIN, ANASPAZ) 0.125 MG tablet Take 0.125 mg by mouth every 4 (four) hours as needed.        . metoprolol (TOPROL XL) 100 MG 24 hr tablet Take 1 tablet (100 mg total) by mouth daily. If ineffective in 1 week , may go to 133m daily  45 tablet  3  . Naproxen Sodium (ALEVE) 220 MG CAPS Take 1 capsule by mouth as needed.           Allergies  Allergen Reactions  . Codeine   . Latex   . Omeprazole     REACTION:  abdominal cramps  . Penicillins       Past medical history, social history, and family history reviewed and updated.  ROS: No headaches, pain in the extremities, history of circulatory problems or strong family history of vascular disease.  PHYSICAL EXAM: BP 96/60  Pulse 66  Ht 5' 5"  (1.651 m)  Wt 148 lb (67.132 kg)  BMI 24.63 kg/m2  SpO2 96%  General-Well developed; no acute distress; hoarse voice Body habitus-proportionate weight and height Neck-No JVD; no carotid bruits Lungs-clear lung fields; resonant to percussion Cardiovascular-normal PMI; normal S1 and S2 Abdomen-normal bowel sounds; soft and non-tender without masses or organomegaly Musculoskeletal-No deformities, no cyanosis or clubbing Neurologic-Normal cranial nerves; symmetric strength and tone Skin-Warm, no significant lesions Extremities-distal pulses intact; no edema  Event Recorder: intermittent tachycardia with a rate as high as 170-appears to be sinus tachycardia  Rhythm strip:  Normal sinus rhythm at a rate of 65; normal PR interval.  Laboratory: CBC normal in 07/2010; ESR of 5; normal complete metabolic profile, TSH, and B12.  [place] elevated, even above the postmenopausal range.  ASSESSMENT AND PLAN:

## 2010-09-24 ENCOUNTER — Ambulatory Visit (HOSPITAL_COMMUNITY): Payer: Medicaid Other

## 2010-09-25 ENCOUNTER — Ambulatory Visit (HOSPITAL_COMMUNITY)
Admission: RE | Admit: 2010-09-25 | Discharge: 2010-09-25 | Disposition: A | Discharge status: Home / Self Care | Payer: Medicaid Other | Source: Ambulatory Visit | Attending: Cardiology | Admitting: Cardiology

## 2010-09-25 DIAGNOSIS — M79609 Pain in unspecified limb: Secondary | ICD-10-CM | POA: Insufficient documentation

## 2010-09-25 DIAGNOSIS — M7989 Other specified soft tissue disorders: Secondary | ICD-10-CM | POA: Insufficient documentation

## 2010-09-25 DIAGNOSIS — M79606 Pain in leg, unspecified: Secondary | ICD-10-CM

## 2010-09-29 ENCOUNTER — Encounter: Payer: Self-pay | Admitting: Cardiology

## 2010-10-02 ENCOUNTER — Encounter: Payer: Self-pay | Admitting: *Deleted

## 2010-10-05 ENCOUNTER — Encounter: Payer: Self-pay | Admitting: *Deleted

## 2010-10-07 ENCOUNTER — Encounter: Payer: Self-pay | Admitting: *Deleted

## 2010-10-21 ENCOUNTER — Emergency Department (HOSPITAL_COMMUNITY)
Admission: EM | Admit: 2010-10-21 | Discharge: 2010-10-21 | Disposition: A | Discharge status: Home / Self Care | Payer: Medicaid Other | Attending: Emergency Medicine | Admitting: Emergency Medicine

## 2010-10-21 DIAGNOSIS — K219 Gastro-esophageal reflux disease without esophagitis: Secondary | ICD-10-CM | POA: Insufficient documentation

## 2010-10-21 DIAGNOSIS — I252 Old myocardial infarction: Secondary | ICD-10-CM | POA: Insufficient documentation

## 2010-10-21 DIAGNOSIS — R197 Diarrhea, unspecified: Secondary | ICD-10-CM | POA: Insufficient documentation

## 2010-10-21 DIAGNOSIS — R112 Nausea with vomiting, unspecified: Secondary | ICD-10-CM | POA: Insufficient documentation

## 2010-10-21 DIAGNOSIS — K589 Irritable bowel syndrome without diarrhea: Secondary | ICD-10-CM | POA: Insufficient documentation

## 2010-10-21 DIAGNOSIS — F411 Generalized anxiety disorder: Secondary | ICD-10-CM | POA: Insufficient documentation

## 2010-10-21 LAB — CBC
HCT: 37.7 % (ref 36.0–46.0)
Hemoglobin: 13.2 g/dL (ref 12.0–15.0)
MCH: 31.1 pg (ref 26.0–34.0)
MCHC: 35 g/dL (ref 30.0–36.0)
MCV: 88.9 fL (ref 78.0–100.0)
Platelets: 217 10*3/uL (ref 150–400)
RBC: 4.24 MIL/uL (ref 3.87–5.11)
RDW: 12.7 % (ref 11.5–15.5)
WBC: 7.5 10*3/uL (ref 4.0–10.5)

## 2010-10-21 LAB — CK TOTAL AND CKMB (NOT AT ARMC)
CK, MB: 1.9 ng/mL (ref 0.3–4.0)
Relative Index: INVALID (ref 0.0–2.5)
Total CK: 51 U/L (ref 7–177)

## 2010-10-21 LAB — DIFFERENTIAL
Basophils Absolute: 0 K/uL (ref 0.0–0.1)
Basophils Relative: 1 % (ref 0–1)
Eosinophils Absolute: 0.2 10*3/uL (ref 0.0–0.7)
Eosinophils Relative: 2 % (ref 0–5)
Lymphocytes Relative: 29 % (ref 12–46)
Lymphs Abs: 2.2 10*3/uL (ref 0.7–4.0)
Monocytes Absolute: 0.6 K/uL (ref 0.1–1.0)
Monocytes Relative: 7 % (ref 3–12)
Neutro Abs: 4.5 10*3/uL (ref 1.7–7.7)
Neutrophils Relative %: 60 % (ref 43–77)

## 2010-10-21 LAB — BASIC METABOLIC PANEL WITH GFR
BUN: 10 mg/dL (ref 6–23)
CO2: 27 meq/L (ref 19–32)
GFR calc non Af Amer: >60 mL/min (ref >60)
Glucose, Bld: 107 mg/dL — ABNORMAL HIGH (ref 70–99)
Potassium: 3.5 meq/L (ref 3.5–5.1)
Sodium: 138 meq/L (ref 135–145)

## 2010-10-21 LAB — BASIC METABOLIC PANEL
Calcium: 10.3 mg/dL (ref 8.4–10.5)
Chloride: 102 mEq/L (ref 96–112)
Creatinine, Ser: 0.86 mg/dL (ref 0.50–1.10)
GFR calc Af Amer: >60 mL/min (ref >60)

## 2010-10-21 LAB — TROPONIN I: Troponin I: <0.3 ng/mL (ref <0.30)

## 2010-12-30 ENCOUNTER — Other Ambulatory Visit: Payer: Self-pay | Admitting: Neurology

## 2010-12-30 DIAGNOSIS — R413 Other amnesia: Secondary | ICD-10-CM

## 2010-12-30 DIAGNOSIS — R569 Unspecified convulsions: Secondary | ICD-10-CM

## 2011-01-03 ENCOUNTER — Ambulatory Visit
Admission: RE | Admit: 2011-01-03 | Discharge: 2011-01-03 | Disposition: A | Discharge status: Home / Self Care | Payer: Medicaid Other | Source: Ambulatory Visit | Attending: Neurology | Admitting: Neurology

## 2011-01-03 DIAGNOSIS — R413 Other amnesia: Secondary | ICD-10-CM

## 2011-01-03 DIAGNOSIS — R569 Unspecified convulsions: Secondary | ICD-10-CM

## 2011-01-20 LAB — POCT CARDIAC MARKERS
Operator id: 230251
Troponin i, poc: <0.05

## 2011-01-25 LAB — CBC
Platelets: 288
WBC: 8.9

## 2011-01-25 LAB — BASIC METABOLIC PANEL
BUN: 8
Creatinine, Ser: 0.85
GFR calc Af Amer: >60
GFR calc non Af Amer: >60
Potassium: 3.3 — ABNORMAL LOW

## 2011-01-25 LAB — DIFFERENTIAL
Lymphocytes Relative: 9 — ABNORMAL LOW
Lymphs Abs: 0.8
Neutrophils Relative %: 87 — ABNORMAL HIGH

## 2011-01-29 LAB — COMPREHENSIVE METABOLIC PANEL
ALT: 13
AST: 20
CO2: 29
Calcium: 10.1
Chloride: 104
GFR calc Af Amer: >60
Potassium: 4.5
Sodium: 142

## 2011-02-09 LAB — BASIC METABOLIC PANEL
CO2: 29
Calcium: 10
GFR calc Af Amer: >60
GFR calc non Af Amer: 58 — ABNORMAL LOW
Glucose, Bld: 110 — ABNORMAL HIGH
Potassium: 4
Sodium: 142

## 2011-02-09 LAB — CBC
HCT: 41.1
Hemoglobin: 13.7
MCHC: 33.3
RBC: 4.52
RDW: 13.3

## 2011-02-09 LAB — HEPATIC FUNCTION PANEL
AST: 21
Albumin: 3.9
Alkaline Phosphatase: 91
Bilirubin, Direct: 0.1
Total Bilirubin: 0.5

## 2011-02-11 ENCOUNTER — Telehealth: Payer: Self-pay | Admitting: Cardiology

## 2011-02-11 MED ORDER — METOPROLOL SUCCINATE ER 100 MG PO TB24: 100.0000 mg | ORAL_TABLET | Freq: Every day | ORAL | Status: DC

## 2011-02-11 NOTE — Telephone Encounter (Signed)
PT DOES NOT HAVE INSURANCE AND STATES THAT HER RX IS NOT WORKING FOR HER HR RATE IT WAS 153 A MIN YESTERDAY SHE ALSO STATES SHE HAS BEEN UNDER MORE STRESS LATLEY.  METOPROLOL 150MG  PT STILL USING [facility]

## 2011-02-12 ENCOUNTER — Other Ambulatory Visit: Payer: Self-pay | Admitting: *Deleted

## 2011-02-12 ENCOUNTER — Telehealth: Payer: Self-pay | Admitting: Cardiology

## 2011-02-12 MED ORDER — METOPROLOL TARTRATE 50 MG PO TABS: 50.0000 mg | ORAL_TABLET | Freq: Two times a day (BID) | ORAL | Status: DC

## 2011-02-12 NOTE — Telephone Encounter (Signed)
Spoke with patient regarding request to change metoprolol succ to metoprolol tartrate.  Advised her that this will now be a 20m tablet twice a day.  [facility].

## 2011-02-12 NOTE — Telephone Encounter (Signed)
Would like her Metoprolol Succinate switched to Metoprolol Tartrate for the price.  Please send to [facility]. / tg

## 2011-02-22 ENCOUNTER — Emergency Department (HOSPITAL_COMMUNITY)
Admission: EM | Admit: 2011-02-22 | Discharge: 2011-02-23 | Disposition: A | Discharge status: Home / Self Care | Payer: Medicaid Other | Attending: Emergency Medicine | Admitting: Emergency Medicine

## 2011-02-22 ENCOUNTER — Encounter (HOSPITAL_COMMUNITY): Payer: Self-pay

## 2011-02-22 ENCOUNTER — Emergency Department (HOSPITAL_COMMUNITY): Payer: Medicaid Other

## 2011-02-22 ENCOUNTER — Other Ambulatory Visit: Payer: Self-pay

## 2011-02-22 DIAGNOSIS — M545 Low back pain, unspecified: Secondary | ICD-10-CM | POA: Insufficient documentation

## 2011-02-22 DIAGNOSIS — R002 Palpitations: Secondary | ICD-10-CM

## 2011-02-22 DIAGNOSIS — R55 Syncope and collapse: Secondary | ICD-10-CM | POA: Insufficient documentation

## 2011-02-22 DIAGNOSIS — R079 Chest pain, unspecified: Secondary | ICD-10-CM | POA: Insufficient documentation

## 2011-02-22 DIAGNOSIS — K219 Gastro-esophageal reflux disease without esophagitis: Secondary | ICD-10-CM | POA: Insufficient documentation

## 2011-02-22 DIAGNOSIS — K589 Irritable bowel syndrome without diarrhea: Secondary | ICD-10-CM | POA: Insufficient documentation

## 2011-02-22 DIAGNOSIS — F17200 Nicotine dependence, unspecified, uncomplicated: Secondary | ICD-10-CM | POA: Insufficient documentation

## 2011-02-22 LAB — D-DIMER, QUANTITATIVE: D-Dimer, Quant: 0.25 ug/mL-FEU (ref 0.00–0.48)

## 2011-02-22 LAB — COMPREHENSIVE METABOLIC PANEL
ALT: 29 U/L (ref 0–35)
AST: 21 U/L (ref 0–37)
Albumin: 3.6 g/dL (ref 3.5–5.2)
Calcium: 9.8 mg/dL (ref 8.4–10.5)
GFR calc Af Amer: >90 mL/min (ref >90)
Potassium: 4 mEq/L (ref 3.5–5.1)
Sodium: 138 mEq/L (ref 135–145)
Total Protein: 6.6 g/dL (ref 6.0–8.3)

## 2011-02-22 LAB — CBC
MCH: 30.8 pg (ref 26.0–34.0)
MCV: 90.8 fL (ref 78.0–100.0)
Platelets: 283 10*3/uL (ref 150–400)
RDW: 12.3 % (ref 11.5–15.5)

## 2011-02-22 LAB — DIFFERENTIAL
Basophils Absolute: 0 10*3/uL (ref 0.0–0.1)
Basophils Relative: 0 % (ref 0–1)
Eosinophils Absolute: 0.3 10*3/uL (ref 0.0–0.7)
Eosinophils Relative: 4 % (ref 0–5)
Neutrophils Relative %: 56 % (ref 43–77)

## 2011-02-22 LAB — CARDIAC PANEL(CRET KIN+CKTOT+MB+TROPI)
Relative Index: INVALID (ref 0.0–2.5)
Troponin I: <0.3 ng/mL (ref <0.30)

## 2011-02-22 MED ORDER — SODIUM CHLORIDE 0.9 % IV SOLN
Freq: Once | INTRAVENOUS | Status: AC
  Administered 2011-02-22: 20:00:00 via INTRAVENOUS

## 2011-02-22 NOTE — ED Provider Notes (Signed)
History   This chart was scribed for Nat Christen, MD by Carolyne Littles. The patient was seen in room APA08/APA08 and the patient's care was started at 7:19PM.   CSN: [id number] Arrival date & time: 02/22/2011  6:22 PM   First MD Initiated Contact with Patient 02/22/11 1827      Chief Complaint  Patient presents with  . Chest Pain    HPI [name] is a 40 y.o. female who presents to the Emergency Department complaining of constant, moderate, dull chest tightness onset this morning and persistent since with associated cough which began 1 week ago with intermittent dizziness and weakness. Patient also notes having right sided calf pain which began several days ago and also having a syncopal episode approximately 2 hours ago. Denies palpitations, nausea, vomiting.Patient notes she has not been compliant with Xanax medication and has not the medication for the past 2 daysPatient with h/o anxiety, GERD, Crohn's disease, Degenerative disc disease, 2 "minor heart attacks" and is a current everyday smoker.   PCP- Luan Pulling Cardiologist- [facility] Past Medical History  Diagnosis Date  . Chest pain     normal stress echo 05/2007  . Palpitation     with tachycardia  . Tobacco abuse     25 yrs pack history 1/2 pack daily  . Hyperglycemia     fasting  . Anxiety   . Chronic low back pain   . GERD (gastroesophageal reflux disease)     EGD 01/2007 by Dr.Rourke small hiatal hernia s/p 18 french maloney   . IBS (irritable bowel syndrome)     chronic constipation h/o colon polyps in 2002 at [facility] per patient   . Closed head injury     memory disturbance    Past Surgical History  Procedure Date  . Appendectomy 2006  . Abdominal hysterectomy     with right salpingo oophorectomy 2005  . Oophorectomy     left for torsion and ovarian fibroma; uterine myoma resected 1995  . Cesarean section     1996  . Colonoscopy 2010    Dr. Gala Romney; negative except for hemorrhoids  . Cholecystectomy     No family  history on file.  History  Substance Use Topics  . Smoking status: Current Everyday Smoker -- 0.3 packs/day for 20 years    Types: Cigarettes  . Smokeless tobacco: Never Used  . Alcohol Use: No    OB History    Grav Para Term Preterm Abortions TAB SAB Ect Mult Living                  Review of Systems 10 Systems reviewed and are negative for acute change except as noted in the HPI.  Allergies  Codeine; Latex; Omeprazole; and Penicillins  Home Medications   Current Outpatient Rx  Name Route Sig Dispense Refill  . ALPRAZOLAM 1 MG PO TABS Oral Take 1 mg by mouth at bedtime as needed.      . CYCLOBENZAPRINE HCL 10 MG PO TABS Oral Take 10 mg by mouth 2 (two) times daily as needed.     Marland Kitchen ESOMEPRAZOLE MAGNESIUM 40 MG PO CPDR Oral Take 40 mg by mouth daily before breakfast.      . HYDROCODONE-ACETAMINOPHEN 10-325 MG PO TABS Oral Take 1 tablet by mouth every 4 (four) hours as needed.      Marland Kitchen HYOSCYAMINE SULFATE 0.125 MG PO TABS Oral Take 0.125 mg by mouth every 4 (four) hours as needed.     Marland Kitchen METOPROLOL TARTRATE  50 MG PO TABS Oral Take 1 tablet (50 mg total) by mouth 2 (two) times daily. 60 tablet 12  . NAPROXEN SODIUM 220 MG PO CAPS Oral Take 1 capsule by mouth as needed.      Marland Kitchen TEMAZEPAM PO Oral Take 1 tablet by mouth as needed.        There were no vitals taken for this visit.  Physical Exam  Nursing note and vitals reviewed. Constitutional: She is oriented to person, place, and time. She appears well-developed and well-nourished. No distress.  HENT:  Head: Normocephalic and atraumatic.  Eyes: EOM are normal. Pupils are equal, round, and reactive to light.  Neck: Neck supple. No tracheal deviation present.  Cardiovascular: Normal rate and regular rhythm.  Exam reveals no friction rub.   No murmur heard. Pulmonary/Chest: Effort normal. No respiratory distress.  Abdominal: She exhibits no distension.  Musculoskeletal: Normal range of motion. She exhibits tenderness (right  calf). She exhibits no edema.  Neurological: She is alert and oriented to person, place, and time. No sensory deficit.  Skin: Skin is warm and dry.  Psychiatric: She has a normal mood and affect. Her behavior is normal.    ED Course  Procedures (including critical care time)  DIAGNOSTIC STUDIES: Oxygen Saturation is 99% on room air, normal by my interpretation.    COORDINATION OF CARE:  Date: 02/22/2011  Rate: 75  Rhythm: normal sinus rhythm  QRS Axis: normal  Intervals: normal  ST/T Wave abnormalities: normal  Conduction Disutrbances:none  Narrative Interpretation:   Old EKG Reviewed: none available   Results for orders placed during the hospital encounter of 02/22/11  CBC      Component Value Range   WBC 7.5  4.0 - 10.5 (K/uL)   RBC 4.15  3.87 - 5.11 (MIL/uL)   Hemoglobin 12.8  12.0 - 15.0 (g/dL)   HCT 37.7  36.0 - 46.0 (%)   MCV 90.8  78.0 - 100.0 (fL)   MCH 30.8  26.0 - 34.0 (pg)   MCHC 34.0  30.0 - 36.0 (g/dL)   RDW 12.3  11.5 - 15.5 (%)   Platelets 283  150 - 400 (K/uL)  DIFFERENTIAL      Component Value Range   Neutrophils Relative 56  43 - 77 (%)   Neutro Abs 4.2  1.7 - 7.7 (K/uL)   Lymphocytes Relative 34  12 - 46 (%)   Lymphs Abs 2.6  0.7 - 4.0 (K/uL)   Monocytes Relative 6  3 - 12 (%)   Monocytes Absolute 0.4  0.1 - 1.0 (K/uL)   Eosinophils Relative 4  0 - 5 (%)   Eosinophils Absolute 0.3  0.0 - 0.7 (K/uL)   Basophils Relative 0  0 - 1 (%)   Basophils Absolute 0.0  0.0 - 0.1 (K/uL)  COMPREHENSIVE METABOLIC PANEL      Component Value Range   Sodium 138  135 - 145 (mEq/L)   Potassium 4.0  3.5 - 5.1 (mEq/L)   Chloride 101  96 - 112 (mEq/L)   CO2 30  19 - 32 (mEq/L)   Glucose, Bld 95  70 - 99 (mg/dL)   BUN 12  6 - 23 (mg/dL)   Creatinine, Ser 0.90  0.50 - 1.10 (mg/dL)   Calcium 9.8  8.4 - 10.5 (mg/dL)   Total Protein 6.6  6.0 - 8.3 (g/dL)   Albumin 3.6  3.5 - 5.2 (g/dL)   AST 21  0 - 37 (U/L)   ALT 29  0 - 35 (U/L)   Alkaline Phosphatase 87  39 -  117 (U/L)   Total Bilirubin 0.2 (*) 0.3 - 1.2 (mg/dL)   GFR calc non Af Amer 78 (*) >90 (mL/min)   GFR calc Af Amer >90  >90 (mL/min)  CARDIAC PANEL(CRET KIN+CKTOT+MB+TROPI)      Component Value Range   Total CK 42  7 - 177 (U/L)   CK, MB 1.6  0.3 - 4.0 (ng/mL)   Troponin I <0.30  <0.30 (ng/mL)   Relative Index RELATIVE INDEX IS INVALID  0.0 - 2.5   D-DIMER, QUANTITATIVE      Component Value Range   D-Dimer, Quant 0.25  0.00 - 0.48 (ug/mL-FEU)     Labs Reviewed - No data to display No results found.   No diagnosis found.    MDM  Patient had syncopal spell today. So complains of chest pain. Patient claims to have had 2 MIs in the past. EKG and cardiac enzymes negative. We'll admit.      I personally performed the services described in this documentation, which was scribed in my presence. The recorded information has been reviewed and considered.    Nat Christen, MD 02/22/11 [phone]

## 2011-02-22 NOTE — ED Notes (Signed)
Pt also, states she has only had 3 bm's in the past two months

## 2011-02-22 NOTE — ED Notes (Signed)
Pt denies any n/v, SOB, tightness in chest or pain in any location at present time. Pt is requesting something to drink.  IV fluid started.  Site started by EMS is benign.  Pt denies any needs at present.

## 2011-02-22 NOTE — ED Notes (Signed)
Pt states she has chronic bronchitis. States her chest has felt tight all day. Had an episode that she passed out

## 2011-02-23 ENCOUNTER — Other Ambulatory Visit: Payer: Self-pay

## 2011-02-23 DIAGNOSIS — R000 Tachycardia, unspecified: Secondary | ICD-10-CM

## 2011-02-23 DIAGNOSIS — R55 Syncope and collapse: Secondary | ICD-10-CM

## 2011-02-23 LAB — CARDIAC PANEL(CRET KIN+CKTOT+MB+TROPI)
CK, MB: 1.5 ng/mL (ref 0.3–4.0)
Total CK: 31 U/L (ref 7–177)

## 2011-02-23 MED ORDER — ALPRAZOLAM 0.5 MG PO TABS
1.0000 mg | ORAL_TABLET | Freq: Once | ORAL | Status: AC
  Administered 2011-02-23: 1 mg via ORAL
  Filled 2011-02-23: qty 2

## 2011-02-23 MED ORDER — ALPRAZOLAM 0.5 MG PO TABS
1.0000 mg | ORAL_TABLET | Freq: Three times a day (TID) | ORAL | Status: DC | PRN
  Administered 2011-02-23: 1 mg via ORAL
  Filled 2011-02-23: qty 2

## 2011-02-23 MED ORDER — MOXIFLOXACIN HCL IN NACL 400 MG/250ML IV SOLN
400.0000 mg | INTRAVENOUS | Status: DC
  Administered 2011-02-23: 400 mg via INTRAVENOUS
  Filled 2011-02-23: qty 250

## 2011-02-23 MED ORDER — MOXIFLOXACIN HCL 400 MG PO TABS: 400.0000 mg | ORAL_TABLET | Freq: Every day | ORAL | Status: AC

## 2011-02-23 MED ORDER — TRAZODONE HCL 50 MG PO TABS
50.0000 mg | ORAL_TABLET | Freq: Every evening | ORAL | Status: DC | PRN
  Administered 2011-02-23: 50 mg via ORAL
  Filled 2011-02-23: qty 1

## 2011-02-23 MED ORDER — SODIUM CHLORIDE 0.9 % IV SOLN: INTRAVENOUS | Status: DC

## 2011-02-23 MED ORDER — TRAZODONE HCL 50 MG PO TABS
ORAL_TABLET | ORAL | Status: AC
  Filled 2011-02-23: qty 1

## 2011-02-23 MED ORDER — METOPROLOL TARTRATE 50 MG PO TABS: 50.0000 mg | ORAL_TABLET | Freq: Two times a day (BID) | ORAL | Status: DC

## 2011-02-23 MED ORDER — METOPROLOL TARTRATE 50 MG PO TABS
50.0000 mg | ORAL_TABLET | Freq: Once | ORAL | Status: AC
  Administered 2011-02-23: 50 mg via ORAL
  Filled 2011-02-23: qty 1

## 2011-02-23 NOTE — ED Notes (Signed)
Dr. Luan Pulling notified of admission holding in ED.

## 2011-02-23 NOTE — ED Notes (Signed)
Pt ate 3/4 of breakfast tray. Resting with no complaints at present. CCM showing NSR. Pt awaiting bed placement.

## 2011-02-23 NOTE — Consult Note (Signed)
CARDIOLOGY CONSULT NOTE  Patient ID: [name] MRN: [id number] DOB/AGE: 05-05-1970 40 y.o.  Admit date: 02/22/2011 Referring Physician: Luan Pulling Primary Physician: Luan Pulling Primary Cardiologist: Lattie Haw Reason for Consultation: Tachycardia and syncope  HPI: [name] is a 40 y/o patient of Dr. Lattie Haw that we are following for recurrent palpitations with history of anxiety and GERD. Stress echo in March of 2012 was negative for ischemia. She was placed on metoprolol 50 mg BID with instructions to take additional dose for recurrent palpitations.     She comes to ER with complaints of syncopal episode after feeling her heart rate up. She states that she came in from the outside, began to feel the HR rising, went to take her medications and woke up on the floor. Called EMS and was brought to [facility]. Initial EMS record has her HR at 140 bpm with BP of 130/60. She was placed on O2.  Since arrival to ER she has been in NSR, SB. She was treated with xanax and trazadone. She is being treated for bronchitis with Avelox.  She has denied any further episodes of rapid HR and has denied chest pain. She complains of coughing.  She denies use of stimulants, coffee . She unfortunately continues to smoke. She denies medical noncompliance.  Review of systems complete and found to be negative unless listed above   Past Medical History  Diagnosis Date  . Chest pain     normal stress echo 05/2007  . Palpitation     with tachycardia  . Tobacco abuse     25 yrs pack history 1/2 pack daily  . Hyperglycemia     fasting  . Anxiety   . Chronic low back pain   . GERD (gastroesophageal reflux disease)     EGD 01/2007 by Dr.Rourke small hiatal hernia s/p 60 french maloney   . IBS (irritable bowel syndrome)     chronic constipation h/o colon polyps in 2002 at [facility] per patient   . Closed head injury     memory disturbance    No family history on file.  History   Social History  . Marital Status: Legally Separated      Spouse Name: N/A    Number of Children: N/A  . Years of Education: N/A   Occupational History  . Not on file.   Social History Main Topics  . Smoking status: Current Everyday Smoker -- 0.3 packs/day for 20 years    Types: Cigarettes  . Smokeless tobacco: Never Used  . Alcohol Use: No  . Drug Use: No  . Sexually Active: Not on file   Other Topics Concern  . Not on file   Social History Narrative  . No narrative on file    Past Surgical History  Procedure Date  . Appendectomy 2006  . Abdominal hysterectomy     with right salpingo oophorectomy 2005  . Oophorectomy     left for torsion and ovarian fibroma; uterine myoma resected 1995  . Cesarean section     1996  . Colonoscopy 2010    Dr. Gala Romney; negative except for hemorrhoids  . Cholecystectomy       Physical Exam: Blood pressure 102/67, pulse 66, temperature 97.6 F (36.4 C), temperature source Oral, resp. rate 18, height 5' 6"  (1.676 m), weight 140 lb (63.504 kg), SpO2 97.00%.  General: Well developed, well nourished, in no acute distress Head: Eyes PERRLA, No xanthomas.   Normal cephalic and atramatic  Lungs: Mild crackles in the bases which  are cleared with coughing.            No carotid bruit. No JVD.  No abdominal bruits. No femoral bruits. Abdomen: Bowel sounds are positive, abdomen soft and non-tender without masses or                  Hernia's noted. Msk:  Back normal, normal gait. Normal strength and tone for age. Extremities: No clubbing, cyanosis or edema.  DP +1 Neuro: Alert and oriented X 3. Psych:  Good affect, responds appropriately   Labs:   D-Dimer : 0.25 Lab Results  Component Value Date   WBC 7.5 02/22/2011   HGB 12.8 02/22/2011   HCT 37.7 02/22/2011   MCV 90.8 02/22/2011   PLT 283 02/22/2011    Lab 02/22/11 2002  NA 138  K 4.0  CL 101  CO2 30  BUN 12  CREATININE 0.90  CALCIUM 9.8  PROT 6.6  BILITOT 0.2*  ALKPHOS 87  ALT 29  AST 21  GLUCOSE 95   Lab Results  Component  Value Date   CKTOTAL 31 02/23/2011   CKMB 1.5 02/23/2011   TROPONINI <0.30 02/23/2011     Radiology: CXR-No active cardiopulmonary process.  EKG: NSR rate of 67 bpm. No evidence of pre-excitation. Nonspecific T-Wave abnormalities anterior.   ASSESSMENT AND PLAN:   1. Tachycardia:  She has a history of palpitations and has been treated with metoprolol with success in the past.  She has not been taking it using extra doses as recommended. On discussion with Dr. Luan Pulling by phone, she has a history of medical noncompliance.  Currently she is stable from our standpoint on the home medications prescribed and given by ER. She can go home from our standpoint.  We have made appointment for follow-up on Novemeber 1st with Dr. Lattie Haw for continued assessment.  She is advised to take meds as directed. I will give her Rx for this.   2.  Bronchitis:  She is prescribed abx here in the ER. Should continue this at Dr. Luan Pulling discretion. Stop smoking!   Phill Myron. Purcell Nails NP [facility] 02/23/2011, 10:20 AM  Cardiology Attending  Patient interviewed and examined. Discussed with Jory Sims, NP.  Above note annotated and modified based upon my findings.  Syncope unlikely to be related to tachycardia in this otherwise healthy young woman with a negative noninvasive cardiac evaluation.  Acute bronchitis may have played a role.  No further inpatient evaluation or treatment required.  We will followup in office as planned.  Jacqulyn Ducking, MD

## 2011-02-23 NOTE — ED Notes (Signed)
Dr. Luan Pulling called and states that he is going to discharge the patient to home. States that she should get OTC Miralax for her constipation. All other instructions will be placed in computer.

## 2011-02-23 NOTE — ED Notes (Signed)
Pt sleeping but arouses easily to verbal stimuli. Pt showing NSR on CCM. Denies pain or shortness of breath. Pt has congested non productive cough. Breath sounds clear and equal bilaterally.

## 2011-02-23 NOTE — ED Notes (Signed)
Pt awake and ambulated to bathroom. Eating her breakfast tray. CCM showing NSR. No c/o chest pain or shortness of breath.

## 2011-02-23 NOTE — ED Notes (Signed)
Pt to be discharged to home after IV Avelox is finished infusing. Pt notified to get OTC Miralax for constipation.

## 2011-02-23 NOTE — H&P (Signed)
[name] MRN: [id number] DOB/AGE: Aug 31, 1970 40 y.o. Primary Care Physician:Britanni Yarde L, MD Admit date: 02/22/2011 Chief Complaint: Chest pain and near syncope HPI: This is a 40 year old who has a long known history of chest discomfort. She has had cardiac arrhythmias and has apparently been followed by a cardiologist and told her heart rate went as high as 180. She said she was having chest pain during the day yesterday and then when she went home she passed out. She says when the EMS service picked her up her heart rate was high. She has continued to have some discomfort. She's also had bronchitis. She has apparently not been taking all her medications appropriately. I had discontinued her narcotics because of concerns about overuse but I did keep her on her Xanax because she has seizures when she comes off. She is being sent to a pain center but has not had an appointment yet  Past Medical History  Diagnosis Date  . Chest pain     normal stress echo 05/2007  . Palpitation     with tachycardia  . Tobacco abuse     25 yrs pack history 1/2 pack daily  . Hyperglycemia     fasting  . Anxiety   . Chronic low back pain   . GERD (gastroesophageal reflux disease)     EGD 01/2007 by Dr.Rourke small hiatal hernia s/p 63 french maloney   . IBS (irritable bowel syndrome)     chronic constipation h/o colon polyps in 2002 at [facility] per patient   . Closed head injury     memory disturbance   Past Surgical History  Procedure Date  . Appendectomy 2006  . Abdominal hysterectomy     with right salpingo oophorectomy 2005  . Oophorectomy     left for torsion and ovarian fibroma; uterine myoma resected 1995  . Cesarean section     1996  . Colonoscopy 2010    Dr. Gala Romney; negative except for hemorrhoids  . Cholecystectomy         No family history on file. Visit positive family history of COPD and lung cancer Social History:  reports that she has been smoking Cigarettes.  She has a 6  pack-year smoking history. She has never used smokeless tobacco. She reports that she does not drink alcohol or use illicit drugs. She lives at home alone  Allergies:  Allergies  Allergen Reactions  . Codeine Shortness Of Breath, Swelling and Rash    Throat swelling  . Penicillins Shortness Of Breath, Swelling and Other (See Comments)    Throat swells   . Omeprazole     REACTION: abdominal cramps  . Latex Rash    Medications Prior to Admission  Medication Dose Route Frequency Provider Last Rate Last Dose  . 0.9 %  sodium chloride infusion   Intravenous Once Nat Christen, MD 250 mL/hr at 02/22/11 2008    . ALPRAZolam [name]) tablet 1 mg  1 mg Oral TID PRN Alonza Bogus   1 mg at 02/23/11 0024  . moxifloxacin (AVELOX) IVPB 400 mg  400 mg Intravenous Q24H Alonza Bogus      . traZODone (DESYREL) tablet 50 mg  50 mg Oral QHS PRN Alonza Bogus   50 mg at 02/23/11 0045   Medications Prior to Admission  Medication Sig Dispense Refill  . ALPRAZolam (XANAX) 1 MG tablet Take 1 mg by mouth 4 (four) times daily as needed. For anxiety      . esomeprazole ([place])  40 MG capsule Take 40 mg by mouth daily as needed. For acid reflux      . hyoscyamine (LEVSIN, ANASPAZ) 0.125 MG tablet Take 0.125 mg by mouth every 4 (four) hours as needed. Diarrhea and abdominal pain      . metoprolol (LOPRESSOR) 50 MG tablet Take 1 tablet (50 mg total) by mouth 2 (two) times daily.  60 tablet  12  . Naproxen Sodium (ALEVE) 220 MG CAPS Take 1 capsule by mouth as needed. For pain      . cyclobenzaprine (FLEXERIL) 10 MG tablet Take 10 mg by mouth 2 (two) times daily as needed. For muscle spasms      . HYDROcodone-acetaminophen (NORCO) 10-325 MG per tablet Take 1 tablet by mouth every 4 (four) hours as needed.       Marland Kitchen TEMAZEPAM PO Take 1 tablet by mouth as needed.             HCW:CBJSE from the symptoms mentioned above,there are no other symptoms referable to all systems reviewed.  Physical Exam: Blood  pressure 96/65, pulse 65, temperature 97.6 F (36.4 C), temperature source Oral, resp. rate 16, height 5' 6"  (1.676 m), weight 63.504 kg (140 lb), SpO2 96.00%. She is awake and alert. She does not appear to be in any acute distress. Her chest shows some rhonchi bilaterally. Her heart is now regular with a rate of about 68. Her abdomen is soft without masses. Her extremities showed no edema. Percent on nervous system examination shows that she is anxious but there is no focal abnormality. Her HEENT shows her pupils are reactive to light and accommodation nose and throat are clear in her mucous membranes are moist. She does not have carotid bruits. She has no JVD.  Results for orders placed during the hospital encounter of 02/22/11 (from the past 48 hour(s))  CBC     Status: Normal   Collection Time   02/22/11  8:02 PM      Component Value Range Comment   WBC 7.5  4.0 - 10.5 (K/uL)    RBC 4.15  3.87 - 5.11 (MIL/uL)    Hemoglobin 12.8  12.0 - 15.0 (g/dL)    HCT 37.7  36.0 - 46.0 (%)    MCV 90.8  78.0 - 100.0 (fL)    MCH 30.8  26.0 - 34.0 (pg)    MCHC 34.0  30.0 - 36.0 (g/dL)    RDW 12.3  11.5 - 15.5 (%)    Platelets 283  150 - 400 (K/uL)   DIFFERENTIAL     Status: Normal   Collection Time   02/22/11  8:02 PM      Component Value Range Comment   Neutrophils Relative 56  43 - 77 (%)    Neutro Abs 4.2  1.7 - 7.7 (K/uL)    Lymphocytes Relative 34  12 - 46 (%)    Lymphs Abs 2.6  0.7 - 4.0 (K/uL)    Monocytes Relative 6  3 - 12 (%)    Monocytes Absolute 0.4  0.1 - 1.0 (K/uL)    Eosinophils Relative 4  0 - 5 (%)    Eosinophils Absolute 0.3  0.0 - 0.7 (K/uL)    Basophils Relative 0  0 - 1 (%)    Basophils Absolute 0.0  0.0 - 0.1 (K/uL)   COMPREHENSIVE METABOLIC PANEL     Status: Abnormal   Collection Time   02/22/11  8:02 PM      Component Value Range Comment  Sodium 138  135 - 145 (mEq/L)    Potassium 4.0  3.5 - 5.1 (mEq/L)    Chloride 101  96 - 112 (mEq/L)    CO2 30  19 - 32 (mEq/L)      Glucose, Bld 95  70 - 99 (mg/dL)    BUN 12  6 - 23 (mg/dL)    Creatinine, Ser 0.90  0.50 - 1.10 (mg/dL)    Calcium 9.8  8.4 - 10.5 (mg/dL)    Total Protein 6.6  6.0 - 8.3 (g/dL)    Albumin 3.6  3.5 - 5.2 (g/dL)    AST 21  0 - 37 (U/L)    ALT 29  0 - 35 (U/L)    Alkaline Phosphatase 87  39 - 117 (U/L)    Total Bilirubin 0.2 (*) 0.3 - 1.2 (mg/dL)    GFR calc non Af Amer 78 (*) >90 (mL/min)    GFR calc Af Amer >90  >90 (mL/min)   CARDIAC PANEL(CRET KIN+CKTOT+MB+TROPI)     Status: Normal   Collection Time   02/22/11  8:02 PM      Component Value Range Comment   Total CK 42  7 - 177 (U/L)    CK, MB 1.6  0.3 - 4.0 (ng/mL)    Troponin I <0.30  <0.30 (ng/mL)    Relative Index RELATIVE INDEX IS INVALID  0.0 - 2.5    D-DIMER, QUANTITATIVE     Status: Normal   Collection Time   02/22/11  8:02 PM      Component Value Range Comment   D-Dimer, Quant 0.25  0.00 - 0.48 (ug/mL-FEU)   CARDIAC PANEL(CRET KIN+CKTOT+MB+TROPI)     Status: Normal   Collection Time   02/23/11  6:22 AM      Component Value Range Comment   Total CK 31  7 - 177 (U/L)    CK, MB 1.5  0.3 - 4.0 (ng/mL)    Troponin I <0.30  <0.30 (ng/mL)    Relative Index RELATIVE INDEX IS INVALID  0.0 - 2.5     No results found for this or any previous visit (from the past 240 hour(s)).  Dg Chest Portable 1 View  02/22/2011  *RADIOLOGY REPORT*  Clinical Data: Chest pain and shortness of breath. Syncopal episode today.  PORTABLE CHEST - 1 VIEW  Comparison: 08/11/2010.  Findings: 1845 hours. The heart size and mediastinal contours are normal. The lungs are clear. There is no pleural effusion or pneumothorax. No acute osseous findings are identified.  Telemetry leads overlie the chest.  IMPRESSION: No active cardiopulmonary process.  Original Report Authenticated By: Vivia Ewing, M.D.   Impression: She has chest pain that was associated with syncope. She apparently had a rapid heart rate with that. She has ruled out for myocardial  infarction. She also has COPD and has acute bronchitis now. Active Problems:  * No active hospital problems. *      Plan: I will ask for cardiology consultation she will continue his treatments help her on Avelox for the COPD and nebulizer treatments.      Dayanna Pryce L 02/23/2011, 9:18 AM

## 2011-02-23 NOTE — ED Notes (Signed)
Pt alert and oriented x 3. Skin warm and dry. Color pink. Breath sounds clear and equal bilaterally. No c/o pain at present. [facility] in to see patient.

## 2011-02-23 NOTE — ED Notes (Signed)
[facility] notified of cardiology consult.

## 2011-02-26 ENCOUNTER — Emergency Department (HOSPITAL_COMMUNITY)
Admission: EM | Admit: 2011-02-26 | Discharge: 2011-02-26 | Disposition: A | Discharge status: Home / Self Care | Payer: Medicaid Other | Attending: Emergency Medicine | Admitting: Emergency Medicine

## 2011-02-26 ENCOUNTER — Other Ambulatory Visit: Payer: Self-pay

## 2011-02-26 ENCOUNTER — Encounter (HOSPITAL_COMMUNITY): Payer: Self-pay | Admitting: Emergency Medicine

## 2011-02-26 ENCOUNTER — Emergency Department (HOSPITAL_COMMUNITY): Payer: Medicaid Other

## 2011-02-26 ENCOUNTER — Telehealth: Payer: Self-pay | Admitting: Cardiology

## 2011-02-26 DIAGNOSIS — K589 Irritable bowel syndrome without diarrhea: Secondary | ICD-10-CM | POA: Insufficient documentation

## 2011-02-26 DIAGNOSIS — R059 Cough, unspecified: Secondary | ICD-10-CM | POA: Insufficient documentation

## 2011-02-26 DIAGNOSIS — R079 Chest pain, unspecified: Secondary | ICD-10-CM | POA: Insufficient documentation

## 2011-02-26 DIAGNOSIS — R0989 Other specified symptoms and signs involving the circulatory and respiratory systems: Secondary | ICD-10-CM | POA: Insufficient documentation

## 2011-02-26 DIAGNOSIS — R5381 Other malaise: Secondary | ICD-10-CM | POA: Insufficient documentation

## 2011-02-26 DIAGNOSIS — R0609 Other forms of dyspnea: Secondary | ICD-10-CM | POA: Insufficient documentation

## 2011-02-26 DIAGNOSIS — M545 Low back pain, unspecified: Secondary | ICD-10-CM | POA: Insufficient documentation

## 2011-02-26 DIAGNOSIS — G8929 Other chronic pain: Secondary | ICD-10-CM | POA: Insufficient documentation

## 2011-02-26 DIAGNOSIS — Z87828 Personal history of other (healed) physical injury and trauma: Secondary | ICD-10-CM | POA: Insufficient documentation

## 2011-02-26 DIAGNOSIS — R209 Unspecified disturbances of skin sensation: Secondary | ICD-10-CM | POA: Insufficient documentation

## 2011-02-26 DIAGNOSIS — R05 Cough: Secondary | ICD-10-CM | POA: Insufficient documentation

## 2011-02-26 DIAGNOSIS — R110 Nausea: Secondary | ICD-10-CM | POA: Insufficient documentation

## 2011-02-26 DIAGNOSIS — F17200 Nicotine dependence, unspecified, uncomplicated: Secondary | ICD-10-CM | POA: Insufficient documentation

## 2011-02-26 DIAGNOSIS — R002 Palpitations: Secondary | ICD-10-CM | POA: Insufficient documentation

## 2011-02-26 LAB — DIFFERENTIAL
Basophils Relative: 0 % (ref 0–1)
Lymphocytes Relative: 24 % (ref 12–46)
Lymphs Abs: 2 10*3/uL (ref 0.7–4.0)
Monocytes Relative: 6 % (ref 3–12)
Neutro Abs: 5.6 10*3/uL (ref 1.7–7.7)
Neutrophils Relative %: 65 % (ref 43–77)

## 2011-02-26 LAB — CBC
Hemoglobin: 12.9 g/dL (ref 12.0–15.0)
MCHC: 33.5 g/dL (ref 30.0–36.0)
RBC: 4.21 MIL/uL (ref 3.87–5.11)
WBC: 8.5 10*3/uL (ref 4.0–10.5)

## 2011-02-26 LAB — BASIC METABOLIC PANEL
BUN: 13 mg/dL (ref 6–23)
CO2: 31 mEq/L (ref 19–32)
Chloride: 101 mEq/L (ref 96–112)
GFR calc Af Amer: 68 mL/min — ABNORMAL LOW (ref >90)
Potassium: 3.8 mEq/L (ref 3.5–5.1)

## 2011-02-26 MED ORDER — SODIUM CHLORIDE 0.9 % IV BOLUS (SEPSIS)
1000.0000 mL | Freq: Once | INTRAVENOUS | Status: AC
  Administered 2011-02-26: 1000 mL via INTRAVENOUS

## 2011-02-26 MED ORDER — SODIUM CHLORIDE 0.9 % IJ SOLN
10.0000 mL | Freq: Once | INTRAMUSCULAR | Status: DC
  Filled 2011-02-26: qty 10

## 2011-02-26 NOTE — ED Provider Notes (Signed)
History   This chart was scribed for Christine Cable, MD by Carolyne Littles. The patient was seen in room APA04/APA04 and the patient's care was started at 8:16PM.   CSN: [id number] Arrival date & time: 02/26/2011  7:21 PM   First MD Initiated Contact with Patient 02/26/11 1943      Chief Complaint  Patient presents with  . Chest Pain   HPI [name] is a 40 y.o. female who presents to the Emergency Department complaining of constant moderate substernal chest pain described as tightness which radiates through to her upper back and right shoulder onset 2 days ago and persistent since with associated right arm numbness, generalized weakness, dyspnea, fever measured at 102 at its highest, cough, chills, diaphoresis, nausea, swelling of LLE and palpitations. Notes pain is aggravated with palpation and deep breathing and relieved by nothing. Patient was evaluated in ED 4 days ago for chest pain but notes current chest pain is different as it is in a different location and radiates through to her back. Denies recent travel/surgery, elicit drug abuse and hormone replacement use. Patient with h/o hyperglycemia, anxiety, GERD, IBS, and is a current smoker. No recent travel/surgery No h/o DVT/PE Past Medical History  Diagnosis Date  . Chest pain     normal stress echo 05/2007  . Palpitation     with tachycardia  . Tobacco abuse     25 yrs pack history 1/2 pack daily  . Hyperglycemia     fasting  . Anxiety   . Chronic low back pain   . GERD (gastroesophageal reflux disease)     EGD 01/2007 by Dr.Rourke small hiatal hernia s/p 57 french maloney   . IBS (irritable bowel syndrome)     chronic constipation h/o colon polyps in 2002 at [facility] per patient   . Closed head injury     memory disturbance    Past Surgical History  Procedure Date  . Appendectomy 2006  . Abdominal hysterectomy     with right salpingo oophorectomy 2005  . Oophorectomy     left for torsion and ovarian fibroma; uterine  myoma resected 1995  . Cesarean section     1996  . Colonoscopy 2010    Dr. Gala Romney; negative except for hemorrhoids  . Cholecystectomy     No family history on file.  History  Substance Use Topics  . Smoking status: Current Everyday Smoker -- 0.3 packs/day for 20 years    Types: Cigarettes  . Smokeless tobacco: Never Used  . Alcohol Use: No    OB History    Grav Para Term Preterm Abortions TAB SAB Ect Mult Living                  Review of Systems 10 Systems reviewed and are negative for acute change except as noted in the HPI.  Allergies  Codeine; Penicillins; Omeprazole; and Latex  Home Medications   Current Outpatient Rx  Name Route Sig Dispense Refill  . ALPRAZOLAM 1 MG PO TABS Oral Take 1 mg by mouth 4 (four) times daily as needed. For anxiety    . CYCLOBENZAPRINE HCL 10 MG PO TABS Oral Take 10 mg by mouth 2 (two) times daily as needed. For muscle spasms    . ESOMEPRAZOLE MAGNESIUM 40 MG PO CPDR Oral Take 40 mg by mouth daily as needed. For acid reflux    . METOPROLOL TARTRATE 50 MG PO TABS Oral Take 1 tablet (50 mg total) by mouth 2 (two)  times daily. 60 tablet 12  . MOXIFLOXACIN HCL 400 MG PO TABS Oral Take 1 tablet (400 mg total) by mouth daily. 10 tablet 0  . NAPROXEN SODIUM 220 MG PO CAPS Oral Take 1 capsule by mouth as needed. For pain    . TRAZODONE HCL 50 MG PO TABS Oral Take 50 mg by mouth at bedtime as needed. For sleep     . HYDROCODONE-ACETAMINOPHEN 10-325 MG PO TABS Oral Take 1 tablet by mouth every 4 (four) hours as needed.     Marland Kitchen HYOSCYAMINE SULFATE 0.125 MG PO TABS Oral Take 0.125 mg by mouth every 4 (four) hours as needed. Diarrhea and abdominal pain      BP 116/56  Pulse 77  Temp(Src) 98.2 F (36.8 C) (Oral)  Resp 16  Ht 5' 6"  (1.676 m)  Wt 142 lb (64.411 kg)  BMI 22.92 kg/m2  SpO2 100%  Physical Exam CONSTITUTIONAL: Well developed/well nourished HEAD AND FACE: Normocephalic/atraumatic EYES: EOMI/PERRL ENMT: Mucous membranes moist NECK:  supple no meningeal signs CV: S1/S2 noted, no murmurs/rubs/gallops noted LUNGS: Lungs are clear to auscultation bilaterally, no apparent distress, chest tender over central region of chest, decreased breath sounds on right ABDOMEN: soft, nontender, no rebound or guarding NEURO: Pt is awake/alert, moves all extremitiesx4 EXTREMITIES: pulses normal, full ROM, no edema SKIN: warm, color normal PSYCH: no abnormalities of mood noted  ED Course  Procedures (including critical care time)  DIAGNOSTIC STUDIES: Oxygen Saturation is 100% on room air, normal by my interpretation.     Date: 02/26/2011  Rate: 69  Rhythm: normal sinus rhythm  QRS Axis: normal  Intervals: normal  ST/T Wave abnormalities: nonspecific ST changes  Conduction Disutrbances:none  Narrative Interpretation:   Old EKG Reviewed: unchanged    COORDINATION OF CARE: 9:58PM- Patient informed of lab and imaging results. Recommended to keep appointment with Dr. Lattie Haw on March 04, 2011 for follow up. Will d/c patient home. Patient agrees with plan set forth at this time. Patient's BP 100/46.    Labs Reviewed  BASIC METABOLIC PANEL - Abnormal; Notable for the following:    Glucose, Bld 102 (*)    Creatinine, Ser 1.14 (*)    GFR calc non Af Amer 59 (*)    GFR calc Af Amer 68 (*)    All other components within normal limits  CBC  DIFFERENTIAL  POCT I-STAT TROPONIN I  I-STAT TROPONIN I   Dg Chest 2 View  02/26/2011  *RADIOLOGY REPORT*  Clinical Data: Smoker with cough, chest pain, tachycardia, and generalized weakness.  CHEST - 2 VIEW 02/26/2011:  Comparison: Portable chest x-ray 02/22/2011, 08/11/2010, 04/18/2010, and two-view chest x-ray 09/14/2008 [facility].  Findings: Cardiomediastinal silhouette unremarkable and unchanged. Mildly prominent bronchovascular markings diffusely and central peribronchial thickening, minimally increased when compared the prior examinations.  No localized airspace consolidation.   No pleural effusions.  Visualized bony thorax intact.  IMPRESSION: Mild changes of acute bronchitis and/or asthma.  No acute cardiopulmonary disease otherwise.  Original Report Authenticated By: Deniece Portela, M.D.     No diagnosis found.    MDM  Nursing notes reviewed and considered in documentation All labs/vitals reviewed and considered (troponin not used in MDM) xrays reviewed and considered - negative Previous records reviewed and considered   8:57 PM Per records, h/o recurrent palpitations, seen by cardiology, saw cardiology recently nml stress echo 2009 Had negative DDimer at that time Has appt on 11/1 with dr Lattie Haw Review of records reveals no h/o CAD  10:11 PM Suspicion for ACS/PE/Dissection is low Pain seems worse with palpation/cough Already has cardiology f/u next week   I personally performed the services described in this documentation, which was scribed in my presence. The recorded information has been reviewed and considered.     Christine Cable, MD 02/26/11 2212

## 2011-02-26 NOTE — ED Notes (Signed)
Pt stating increased heaviness in central chest since discharge for chest pain

## 2011-02-26 NOTE — Telephone Encounter (Signed)
Patient complains of chest pain and right arm pain on and off all day.  Transferred call to nurse.

## 2011-02-26 NOTE — Telephone Encounter (Signed)
**Note De-Identified  Obfuscation** Pt. states she has been having CP off and on all day that radiates to her neck and right arm. She also c/o nausea and some SOB. Pt. advised to have friend, who is with her now, to drive her to ER. She states she understands instructions given and on her way to [facility] ER in [place](pt. in [place] when she called)./LV

## 2011-02-26 NOTE — ED Notes (Signed)
Pt noticeably more tired, slurred speech, dr notified. Pt has her own medication at bedside had requested flexeril earlier and dr stated no to additional orders.

## 2011-02-26 NOTE — ED Notes (Signed)
Pt left the er with her female companion no noted distress self ambulating with a steady gait.

## 2011-02-26 NOTE — ED Notes (Signed)
Pt refused to sign discharge paperwork or wait for explanation on discharge instructions,

## 2011-02-26 NOTE — ED Notes (Signed)
Pt stating "i am pissed all i asked for was xanax and flexeril, i dont know what the problem is". I explained to the pt that i could not given her medications that were not order" Pt self ambulated out with a steady gait no noted distress no noted sob.

## 2011-03-02 ENCOUNTER — Encounter: Payer: Self-pay | Admitting: Cardiology

## 2011-03-02 NOTE — Telephone Encounter (Signed)
Appreciate management of this issue by Judson Roch with her plan.

## 2011-03-04 ENCOUNTER — Ambulatory Visit: Payer: Medicaid Other | Admitting: Cardiology

## 2011-04-29 ENCOUNTER — Telehealth: Payer: Self-pay | Admitting: Cardiology

## 2011-04-29 MED ORDER — METOPROLOL SUCCINATE ER 100 MG PO TB24: 100.0000 mg | ORAL_TABLET | Freq: Every day | ORAL | Status: DC

## 2011-04-29 NOTE — Telephone Encounter (Signed)
Patient wants to take Metoprolol once a day, therefore script sent to [facility] in [place] for 112m Metoprolol ER.  Advised her that Dr RLattie Hawhad notated in his previous note that she could take up to 150 mg daily if needed.

## 2011-04-29 NOTE — Telephone Encounter (Signed)
PT WANTS TO KNOW IF IT IS OK TO TAKE 100MG OF METOPROLOL SHE HAS RX FOR 50MG.

## 2011-05-23 ENCOUNTER — Other Ambulatory Visit: Payer: Self-pay

## 2011-05-23 ENCOUNTER — Emergency Department (HOSPITAL_COMMUNITY): Payer: Medicaid Other

## 2011-05-23 ENCOUNTER — Encounter (HOSPITAL_COMMUNITY): Payer: Self-pay | Admitting: *Deleted

## 2011-05-23 ENCOUNTER — Telehealth: Payer: Self-pay | Admitting: Nurse Practitioner

## 2011-05-23 ENCOUNTER — Emergency Department (HOSPITAL_COMMUNITY)
Admission: EM | Admit: 2011-05-23 | Discharge: 2011-05-23 | Disposition: A | Discharge status: Home / Self Care | Payer: Medicaid Other | Attending: Emergency Medicine | Admitting: Emergency Medicine

## 2011-05-23 DIAGNOSIS — K5900 Constipation, unspecified: Secondary | ICD-10-CM | POA: Insufficient documentation

## 2011-05-23 DIAGNOSIS — R0609 Other forms of dyspnea: Secondary | ICD-10-CM | POA: Insufficient documentation

## 2011-05-23 DIAGNOSIS — Z9079 Acquired absence of other genital organ(s): Secondary | ICD-10-CM | POA: Insufficient documentation

## 2011-05-23 DIAGNOSIS — F17200 Nicotine dependence, unspecified, uncomplicated: Secondary | ICD-10-CM | POA: Insufficient documentation

## 2011-05-23 DIAGNOSIS — Z9889 Other specified postprocedural states: Secondary | ICD-10-CM | POA: Insufficient documentation

## 2011-05-23 DIAGNOSIS — R0989 Other specified symptoms and signs involving the circulatory and respiratory systems: Secondary | ICD-10-CM | POA: Insufficient documentation

## 2011-05-23 DIAGNOSIS — G8929 Other chronic pain: Secondary | ICD-10-CM | POA: Insufficient documentation

## 2011-05-23 DIAGNOSIS — K589 Irritable bowel syndrome without diarrhea: Secondary | ICD-10-CM | POA: Insufficient documentation

## 2011-05-23 DIAGNOSIS — M545 Low back pain, unspecified: Secondary | ICD-10-CM | POA: Insufficient documentation

## 2011-05-23 DIAGNOSIS — M7989 Other specified soft tissue disorders: Secondary | ICD-10-CM | POA: Insufficient documentation

## 2011-05-23 DIAGNOSIS — M542 Cervicalgia: Secondary | ICD-10-CM | POA: Insufficient documentation

## 2011-05-23 DIAGNOSIS — R071 Chest pain on breathing: Secondary | ICD-10-CM | POA: Insufficient documentation

## 2011-05-23 DIAGNOSIS — K219 Gastro-esophageal reflux disease without esophagitis: Secondary | ICD-10-CM | POA: Insufficient documentation

## 2011-05-23 DIAGNOSIS — Z860100 Personal history of colon polyps, unspecified: Secondary | ICD-10-CM | POA: Insufficient documentation

## 2011-05-23 DIAGNOSIS — Z87828 Personal history of other (healed) physical injury and trauma: Secondary | ICD-10-CM | POA: Insufficient documentation

## 2011-05-23 DIAGNOSIS — R0600 Dyspnea, unspecified: Secondary | ICD-10-CM

## 2011-05-23 LAB — BASIC METABOLIC PANEL
BUN: 10 mg/dL (ref 6–23)
Creatinine, Ser: 0.86 mg/dL (ref 0.50–1.10)
GFR calc Af Amer: >90 mL/min (ref >90)
GFR calc non Af Amer: 82 mL/min — ABNORMAL LOW (ref >90)
Potassium: 3.8 mEq/L (ref 3.5–5.1)

## 2011-05-23 LAB — CBC
Hemoglobin: 12.7 g/dL (ref 12.0–15.0)
MCHC: 34.1 g/dL (ref 30.0–36.0)
Platelets: 261 10*3/uL (ref 150–400)
RDW: 12.8 % (ref 11.5–15.5)

## 2011-05-23 LAB — POCT I-STAT TROPONIN I: Troponin i, poc: 0 ng/mL (ref 0.00–0.08)

## 2011-05-23 LAB — DIFFERENTIAL
Basophils Absolute: 0 10*3/uL (ref 0.0–0.1)
Basophils Relative: 0 % (ref 0–1)
Neutro Abs: 4.7 10*3/uL (ref 1.7–7.7)
Neutrophils Relative %: 61 % (ref 43–77)

## 2011-05-23 LAB — D-DIMER, QUANTITATIVE: D-Dimer, Quant: 0.22 ug/mL-FEU (ref 0.00–0.48)

## 2011-05-23 MED ORDER — IBUPROFEN 400 MG PO TABS
400.0000 mg | ORAL_TABLET | Freq: Once | ORAL | Status: AC
  Administered 2011-05-23: 400 mg via ORAL
  Filled 2011-05-23 (×2): qty 1

## 2011-05-23 NOTE — ED Notes (Addendum)
Pt c/o swelling in bilateral legs since yesterday. Pt also states that she has a knot on her right ankle since yesterday. Also c/o sharp pain in her left chest 2 hours ago. Pt also c/o diarrhea since yesterday.

## 2011-05-23 NOTE — Telephone Encounter (Signed)
Pt called this am stating that since yesterday she has noted left greater than right lower extremity edema ("they're tight.") and a "knot" above her left ankle.  She has also noted some DOE.  No chest pain.  I advised that given the acuity of her Ss, she should be evaluated either in her local urgent care or ED to ensure that she doesn't have a DVT.  She verbalized understanding and plans to go to urgent care.

## 2011-05-23 NOTE — ED Notes (Signed)
While triaging patient she states that she now has pressure in her left chest that started while we were taking her blood pressure. EKG done.

## 2011-05-23 NOTE — ED Provider Notes (Signed)
History  This chart was scribed for Christine Relic, MD by Jenne Campus. This patient was seen in room APA14/APA14 and the patient's care was started at 4:05PM.  CSN: [id number]  Arrival date & time 05/23/11  1349   First MD Initiated Contact with Patient 05/23/11 1559      Chief Complaint  Patient presents with  . Leg Swelling    The history is provided by the patient. No language interpreter was used.    FONTAINE KOSSMAN is a 41 y.o. female who presents to the Emergency Department complaining of gradual onset, transient bilateral leg swelling and left ankle tenderness that appeared yesterday that has since resolved. She denies any modifying factors. She reports experiencing similar incidents with bilateral leg swelling in the summer that resolve on their own. She denies being on water pills or having compression socks. She also c/o SOB with activity for a few months. The SOB is better with rest and last a few minutes. She has not discussed this with PCP. 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 The pain is non-radiating and well localized. She reports having one or two episodes a week that last for several several minutes. She denies sudden changes in speech, vision or coordination as associated symptoms. She was seen in this hospital for similar symptoms last month and had negative D-dimer test performed.  She is a current smoker but denies alcohol use.   Past Medical History  Diagnosis Date  . Chest pain     normal stress echo 05/2007  . Palpitation     with tachycardia  . Tobacco abuse     25 yrs pack history 1/2 pack daily  . Hyperglycemia     fasting  . Anxiety   . Chronic low back pain   . GERD (gastroesophageal reflux disease)     EGD 01/2007 by Dr.Rourke small hiatal hernia s/p 23 french maloney   . IBS (irritable bowel syndrome)     chronic constipation h/o colon polyps in 2002 at [facility] per patient   . Closed  head injury     memory disturbance    Past Surgical History  Procedure Date  . Appendectomy 2006  . Abdominal hysterectomy     with right salpingo oophorectomy 2005  . Oophorectomy     left for torsion and ovarian fibroma; uterine myoma resected 1995  . Cesarean section     1996  . Colonoscopy 2010    Dr. Gala Romney; negative except for hemorrhoids  . Cholecystectomy     History reviewed. No pertinent family history.  History  Substance Use Topics  . Smoking status: Current Everyday Smoker -- 0.3 packs/day for 20 years    Types: Cigarettes  . Smokeless tobacco: Never Used  . Alcohol Use: No    OB History    Grav Para Term Preterm Abortions TAB SAB Ect Mult Living                  Review of Systems  Constitutional: Negative for fever.       10 Systems reviewed and are negative for acute change except as noted in the HPI.  HENT: Positive for neck pain (Chronic). Negative for congestion.   Eyes: Negative for discharge and redness.  Respiratory: Negative for cough and shortness of breath.   Cardiovascular: Positive for chest pain (chronic) and leg swelling.  Gastrointestinal: Negative for vomiting and abdominal pain.  Musculoskeletal:  Positive for back pain (Chronic).  Skin: Negative for rash.  Neurological: Negative for syncope, numbness and headaches.  Psychiatric/Behavioral:       No behavior change.    Allergies  Codeine; Penicillins; Omeprazole; and Latex  Home Medications   Current Outpatient Rx  Name Route Sig Dispense Refill  . METOPROLOL SUCCINATE ER 100 MG PO TB24 Oral Take 1 tablet (100 mg total) by mouth daily. 90 tablet 3    Triage Vitals: BP 108/54  Pulse 85  Temp(Src) 98 F (36.7 C) (Oral)  Resp 20  Ht 5' 6"  (1.676 m)  Wt 150 lb (68.04 kg)  BMI 24.21 kg/m2  SpO2 98%  Physical Exam  Nursing note and vitals reviewed. Constitutional: She is oriented to person, place, and time. She appears well-developed and well-nourished.       Awake, alert,  nontoxic appearance.  HENT:  Head: Normocephalic and atraumatic.  Eyes: Right eye exhibits no discharge. Left eye exhibits no discharge.  Neck: Neck supple.  Cardiovascular: Normal rate and regular rhythm.   No murmur heard. Pulmonary/Chest: Effort normal and breath sounds normal. She exhibits tenderness (reproducible chest wall tenderness with palpation).  Abdominal: Soft. There is no tenderness. There is no rebound.  Musculoskeletal: She exhibits no tenderness.       Baseline ROM, no obvious new focal weakness.  Neurological: She is alert and oriented to person, place, and time. No cranial nerve deficit.       Mental status and motor strength appears baseline for patient and situation.  Skin: No rash noted.  Psychiatric: She has a normal mood and affect. Her behavior is normal.    ED Course  Procedures (including critical care time) ECG: Normal sinus rhythm, ventricular rate 82, right axis deviation, no acute ischemic changes noted,RSR' V1 with normal QRS duration, no comparison ECG available  Medications  ibuprofen (ADVIL,MOTRIN) tablet 400 mg (400 mg Oral Given 05/23/11 1737)    DIAGNOSTIC STUDIES: Oxygen Saturation is 98% on room air, normal by my interpretation.    COORDINATION OF CARE: 4:11PM-Discussed normal EKG and pt acknowledged results. Discussed D-dimer test, blood counts, kidney function, heart markers and chest x-ray with pt and pt agreed to plan. 6:20PM-Pt will be discharged home and she will follow-up with PCP within the next week.  Labs Reviewed  BASIC METABOLIC PANEL - Abnormal; Notable for the following:    GFR calc non Af Amer 82 (*)    All other components within normal limits  CBC  DIFFERENTIAL  D-DIMER, QUANTITATIVE  POCT I-STAT TROPONIN I  I-STAT TROPONIN I   Dg Chest 2 View  05/23/2011  *RADIOLOGY REPORT*  Clinical Data: Arrhythmia, chest pain  CHEST - 2 VIEW  Comparison: 02/26/2011  Findings: Lungs are clear. No pleural effusion or pneumothorax.   Cardiomediastinal silhouette is within normal limits.  Visualized osseous structures are within normal limits.  IMPRESSION: Normal chest radiographs.  Original Report Authenticated By: Julian Hy, M.D.     1. Dyspnea       MDM  I doubt any other EMC precluding discharge at this time including, but not necessarily limited to the following:ACS, PE.    I personally performed the services described in this documentation, which was scribed in my presence. The recorded information has been reviewed and considered.     Christine Relic, MD 05/24/11 [phone]

## 2011-05-23 NOTE — ED Notes (Signed)
Patient alert and oriented x 4 with respirations even and unlabored.  NAD at this time.  IV was removed and pt got dressed.  Patient left before discharge instructions could be reviewed.

## 2011-05-23 NOTE — ED Notes (Signed)
Dr. Thurnell Garbe given a copy of EKG and no further testing ordered at this time. Pt placed in small waiting area behind triage.

## 2011-06-23 ENCOUNTER — Encounter (INDEPENDENT_AMBULATORY_CARE_PROVIDER_SITE_OTHER): Payer: Self-pay | Admitting: *Deleted

## 2011-07-13 ENCOUNTER — Other Ambulatory Visit: Payer: Self-pay | Admitting: Cardiology

## 2011-07-14 ENCOUNTER — Encounter (INDEPENDENT_AMBULATORY_CARE_PROVIDER_SITE_OTHER): Payer: Self-pay | Admitting: Internal Medicine

## 2011-07-14 ENCOUNTER — Ambulatory Visit (INDEPENDENT_AMBULATORY_CARE_PROVIDER_SITE_OTHER): Payer: Medicaid Other | Admitting: Internal Medicine

## 2011-07-14 VITALS — BP 106/60 | HR 72 | Temp 98.5°F | Ht 66.5 in | Wt 157.7 lb

## 2011-07-14 DIAGNOSIS — I10 Essential (primary) hypertension: Secondary | ICD-10-CM

## 2011-07-14 DIAGNOSIS — K589 Irritable bowel syndrome without diarrhea: Secondary | ICD-10-CM

## 2011-07-14 MED ORDER — DICYCLOMINE HCL 10 MG PO CAPS: 10.0000 mg | ORAL_CAPSULE | Freq: Two times a day (BID) | ORAL | Status: DC

## 2011-07-14 NOTE — Progress Notes (Addendum)
Subjective:     Patient ID: [name], female   DOB: [birth year], 41 y.o.   MRN: [id number]  HPI  [name] is a 41 yr old female referred to our office by Dr. Luan Pulling.  She tells me she has been getting constipated and her rt sided will swell.  She had tried Ex-Lax, Mylanta, Correctol without relief. Symptoms started first of December till the first of February.  Now she has diarrhea. She is having 5 stools a day.  She tells me all her stools are loose.  Her appetite is not good. No weight loss. She has actually gained weight.  No bright red rectal bleeding or melena.  She does tell me she vomited 1 1/2 week ago and saw blood.  She does c/o rt sided abdominal pain.  Stools are light brown.  She tells me her stools had been black last month.  Abdominal pain with BMs.   She does not want medications for her diarrhea because she is afraid she will become constipated. She also c/o frequent acid reflux. She also says she has dysphagia.  Chicken sandwich will lodge.  05/24/2008 Ileocolonoscopy with stool sampling Dr. Gala Romney :IMPRESSION: Anal canal internal hemorrhoids and papilla, otherwise  normal rectum, grossly normal colon pull prep. Certainly no evidence of  inflammatory bowel disease. Normal terminal ileum status post stool  sampling. 11/2006 EGD/Colonoscopy Dr. Gala Romney Abdominal pain/blood diarrhea:well and was reactive.  ENDOSCOPY IMPRESSION: Esophagogastroduodenoscopy: Normal esophagus,  stomach, D1, D2.  COLONOSCOPY FINDINGS: Anal papillae and internal hemorrhoids, otherwise  normal rectum. Normal colon. Normal terminal ileum.  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    01/03/2007 EGD/ED: GERD/Dysphagia  :IMPRESSION:  1. Normal esophagus, status post passage of a 56-French Maloney  dilator.  2. Small hiatal hernia.  3. Otherwise normal stomach, D1, D2.    06/17/2008 CT abdomen/pelvis with contrast: abdominal pain, diarrhea, weight loss: IMPRESSION:  Unremarkable CT examination of the abdomen.  CT PELVIS  Findings: The rectum, sigmoid colon visualized small bowel loops  are unremarkable. The patient has had a hysterectomy. The patient  has also had a appendectomy. No pelvic masses, adenopathy or free  pelvic fluid collections. The bony pelvis is intact. No inguinal  mass or hernia.  IMPRESSION:  No acute pelvic findings, masses or adenopathy.  Review of Systems see hpi     Current Outpatient Prescriptions  Medication Sig Dispense Refill  . cyclobenzaprine (FLEXERIL) 10 MG tablet Take 10 mg by mouth 2 (two) times daily as needed.      Marland Kitchen esomeprazole (NEXIUM) 40 MG capsule Take 40 mg by mouth daily before breakfast.      . metoprolol succinate (TOPROL-XL) 100 MG 24 hr tablet TAKE ONE TABLET BY MOUTH DAILY.  30 tablet  3   Current Outpatient Prescriptions on File Prior to Visit  Medication Sig Dispense Refill  . metoprolol succinate (TOPROL-XL) 100 MG 24 hr tablet TAKE ONE TABLET BY MOUTH DAILY.  30 tablet  3   Current Outpatient Prescriptions on File Prior to Visit  Medication Sig Dispense Refill  . metoprolol succinate (TOPROL-XL) 100 MG 24 hr tablet TAKE ONE TABLET BY MOUTH DAILY.  30 tablet  3   Past Medical History  Diagnosis Date  . Chest pain     normal stress echo 05/2007  . Palpitation     with tachycardia  .  Tobacco abuse     25 yrs pack history 1/2 pack daily  . Hyperglycemia     fasting  . Anxiety   . Chronic low back pain   . GERD (gastroesophageal reflux disease)     EGD 01/2007 by Dr.Rourke small hiatal hernia s/p 64 french maloney   . IBS (irritable bowel syndrome)     chronic constipation h/o colon polyps in 2002 at [facility] per patient   . Closed head injury     memory disturbance  . Crohn's disease    History   Social History  . Marital Status: Divorced    Spouse Name: N/A    Number of Children: N/A  .  Years of Education: N/A   Occupational History  . Not on file.   Social History Main Topics  . Smoking status: Current Everyday Smoker -- 0.3 packs/day for 20 years    Types: Cigarettes  . Smokeless tobacco: Never Used  . Alcohol Use: No  . Drug Use: No  . Sexually Active: Not on file   Other Topics Concern  . Not on file   Social History Narrative  . No narrative on file   Family Status  Relation Status Death Age  . Mother Deceased     61 lung cancer hx of cad and hypertension  . Father Deceased 98    cardiac disease  . Sister Alive     stomach problems  . Brother Alive     stomach problems   Allergies  Allergen Reactions  . Codeine Shortness Of Breath, Swelling and Rash    Throat swelling  . Penicillins Shortness Of Breath, Swelling and Other (See Comments)    Throat swells   . Omeprazole     REACTION: abdominal cramps  . Latex Rash    Objective:   Physical Exam Filed Vitals:   07/14/11 1022  Height: 5' 6.5" (1.689 m)  Weight: 157 lb 11.2 oz (71.532 kg)   Alert and oriented. Skin warm and dry. Oral mucosa is moist.   . Sclera anicteric, conjunctivae is pink. Thyroid not enlarged. No cervical lymphadenopathy. Lungs clear. Heart regular rate and rhythm.  Abdomen is soft. Bowel sounds are positive. No hepatomegaly. No abdominal masses felt. No tenderness.  No edema to lower extremities. Patient is alert and oriented.  Stool brown and guaiac negative.     Assessment:    Probably IBS.  Acid reflux which is not controlled at this time.  I discussed this case with Dr. Laural Golden. Plan:       Pill esophgram and Small bowel follow thru. Bentyl 62m BID. Further recommendations once we have these results back.

## 2011-07-14 NOTE — Progress Notes (Signed)
Addended by: Butch Penny on: 07/14/2011 11:59 AM   Modules accepted: Level of Service

## 2011-07-14 NOTE — Patient Instructions (Signed)
Bentyl 22m BID

## 2011-07-16 ENCOUNTER — Ambulatory Visit (HOSPITAL_COMMUNITY)
Admission: RE | Admit: 2011-07-16 | Discharge: 2011-07-16 | Disposition: A | Discharge status: Home / Self Care | Payer: Medicaid Other | Source: Ambulatory Visit | Attending: Internal Medicine | Admitting: Internal Medicine

## 2011-07-16 DIAGNOSIS — R1310 Dysphagia, unspecified: Secondary | ICD-10-CM | POA: Insufficient documentation

## 2011-07-16 DIAGNOSIS — R109 Unspecified abdominal pain: Secondary | ICD-10-CM | POA: Insufficient documentation

## 2011-07-16 DIAGNOSIS — K589 Irritable bowel syndrome without diarrhea: Secondary | ICD-10-CM

## 2011-07-19 ENCOUNTER — Telehealth (INDEPENDENT_AMBULATORY_CARE_PROVIDER_SITE_OTHER): Payer: Self-pay | Admitting: Internal Medicine

## 2011-07-20 ENCOUNTER — Other Ambulatory Visit (INDEPENDENT_AMBULATORY_CARE_PROVIDER_SITE_OTHER): Payer: Self-pay | Admitting: *Deleted

## 2011-07-20 ENCOUNTER — Encounter (INDEPENDENT_AMBULATORY_CARE_PROVIDER_SITE_OTHER): Payer: Self-pay | Admitting: *Deleted

## 2011-07-20 ENCOUNTER — Telehealth (INDEPENDENT_AMBULATORY_CARE_PROVIDER_SITE_OTHER): Payer: Self-pay | Admitting: Internal Medicine

## 2011-07-20 DIAGNOSIS — R140 Abdominal distension (gaseous): Secondary | ICD-10-CM

## 2011-07-20 NOTE — Telephone Encounter (Signed)
Bacterial overgrowth sch'd 07/28/11 @ 830 (800), patient aware

## 2011-07-20 NOTE — Telephone Encounter (Signed)
Results given to patient. I am going to discuss with Dr. Laural Golden.

## 2011-07-20 NOTE — Telephone Encounter (Addendum)
I discussed with Dr. Laural Golden.  Bacterial overgrowth

## 2011-07-22 ENCOUNTER — Encounter (HOSPITAL_COMMUNITY): Payer: Self-pay | Admitting: Pharmacy Technician

## 2011-07-28 ENCOUNTER — Ambulatory Visit (HOSPITAL_COMMUNITY)
Admission: RE | Admit: 2011-07-28 | Discharge: 2011-07-28 | Disposition: A | Discharge status: Home / Self Care | Payer: Medicaid Other | Source: Ambulatory Visit | Attending: Internal Medicine | Admitting: Internal Medicine

## 2011-07-28 ENCOUNTER — Encounter (HOSPITAL_COMMUNITY)
Admission: RE | Disposition: A | Discharge status: Home / Self Care | Payer: Self-pay | Source: Ambulatory Visit | Attending: Internal Medicine

## 2011-07-28 DIAGNOSIS — K219 Gastro-esophageal reflux disease without esophagitis: Secondary | ICD-10-CM | POA: Insufficient documentation

## 2011-07-28 SURGERY — BREATH TEST, FOR INTESTINAL BACTERIAL OVERGROWTH
Anesthesia: LOCAL

## 2011-07-28 MED ORDER — LACTULOSE 10 GM/15ML PO SOLN
ORAL | Status: AC
  Administered 2011-07-28: 20 g via ORAL
  Filled 2011-07-28: qty 60

## 2011-07-28 MED ORDER — LACTULOSE 10 GM/15ML PO SOLN
20.0000 g | Freq: Once | ORAL | Status: AC
  Administered 2011-07-28: 20 g via ORAL

## 2011-07-28 NOTE — Progress Notes (Signed)
No beans, bran or high fiber cereal the day before the procedure? no NPO except for water 12 hours before procedure? yes No smoking, sleeping or vigorous exercising for at least 30 before procedure? no Recent antibiotic use and/or diarrhea?  No    If yes, physician notified.

## 2011-07-29 NOTE — H&P (Signed)
This is an update to history and physical from 07/14/2011. Patient is here for hydration breath test for SIBO.

## 2011-08-23 ENCOUNTER — Encounter (INDEPENDENT_AMBULATORY_CARE_PROVIDER_SITE_OTHER): Payer: Self-pay | Admitting: Internal Medicine

## 2011-08-23 ENCOUNTER — Ambulatory Visit (INDEPENDENT_AMBULATORY_CARE_PROVIDER_SITE_OTHER): Payer: Medicaid Other | Admitting: Internal Medicine

## 2011-08-23 VITALS — BP 118/74 | HR 78 | Temp 97.7°F | Resp 20 | Ht 66.0 in | Wt 158.5 lb

## 2011-08-23 DIAGNOSIS — R109 Unspecified abdominal pain: Secondary | ICD-10-CM

## 2011-08-23 DIAGNOSIS — R635 Abnormal weight gain: Secondary | ICD-10-CM

## 2011-08-23 MED ORDER — HYOSCYAMINE SULFATE ER 0.375 MG PO TB12: 375.0000 ug | ORAL_TABLET | Freq: Every day | ORAL | Status: DC

## 2011-08-23 NOTE — Patient Instructions (Signed)
Dietary instructions provided to the patient. Physician will contact you with the results of blood work and CT abdomen and pelvis.

## 2011-08-23 NOTE — Progress Notes (Signed)
Presenting complaint;  Followup for abdominal pain and irregular bowel movements.  Subjective:  [name] is a 41 year old Caucasian female patient of Dr. Luan Pulling who is here for scheduled visit. She presented with irregular bowel movements with predominantly diarrhea bloating and right-sided abdominal pain. She was also complaining of dysphagia. She had normal barium pill study and small bowel follow-through. She was begun on dicyclomine. She did not feel any better. She returned for hydrogen breath test and this was also normal. Patient continues to complain of irregular bowel movements diarrhea much more frequent than constipation. She recalls that she always would have problems with constipation good go couple of weeks without a bowel movement. A few months ago she had a period of 2 months today she had diarrhea every day but now it is intermittent. He continues to complain of bloating and feels her right side of her abdomen swells and she feels as if she has a mass in her abdomen. Yesterday she did not have any bowel movement but this morning before coming to her office she already had three   mushy stools. On some days she may have as many as 56 stools per day she denies melena or rectal bleeding. Her appetite is not good and she generally is one or 2 meals a day. She also complains of intermittent pain across mid and lower abdomen described as sharp and cramping. She has not gained any weight since her last visit. She states following her hysterectomy and oophorectomy in May 2006 she 60 pounds. She is also wondering if her Crohn's disease is back. This diagnosis was made when she was evaluated at [facility] back in 1998. She denies fever, chills, night sweats, dysuria. Hematuria, nausea or vomiting. Patient states that the result was not controlling her heartburn and she was therefore switched to omeprazole by Dr. Luan Pulling in she believes is working.  Current Medications: Cyclobenzaprine 10 mg by  mouth twice a day when necessary. Metoprolol succinate 100 mg by mouth daily. Metoprolol 40 mg by mouth every morning. Dicyclomine 10 mg by mouth twice a day  Past medical history; History of palpitation and tachycardia controlled with beta blocker. Chronic GERD. She had her esophagus dilated by Dr. Gala Romney in September 2008. She also had EGD in July 2007 was normal History of Crohn's disease diagnosed in 1998. History of colonic polyps in 2002 but colonoscopy in July 2007 by Dr. Gala Romney was normal Last colonoscopy was in January 2010 by Dr. Vincenza Hews Normal appearing ileal and colonic mucosa. She had internal hemorrhoids. C-section in 1996. She had single ovary removed ostomy in April 2006 and she had other ovary removed along with hysterectomy and incidental appendectomy May 2006. Cholecystectomy in 2006. Close head injury in 2006; fell off a horse. 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    Objective: Blood pressure 118/74, pulse 78, temperature 97.7 F (36.5 C), temperature source Oral, resp. rate 20, height 5' 6"  (1.676 m), weight 158 lb 8 oz (71.895 kg). The patient is alert and in no acute distress. Conjunctiva is pink. Sclera is nonicteric Oropharyngeal mucosa is normal. No neck masses or thyromegaly noted. Cardiac exam with regular rhythm normal S1 and S2. No murmur or gallop noted. Lungs are clear to auscultation. Abdomen is full. Bowel sounds are normal. On palpation soft abdomen with mild tenderness in right lower quadrant. No organomegaly or masses noted.  No LE edema or clubbing noted.  Labs/studies Results:  Hydrogen breath test normal on 07/28/2011. Normal barium pill study and small  bowel follow-through on 07/16/2011.  Assessment:  Patient's current symptoms appear to be typical of irritable bowel syndrome. However she is not responding to therapy. She has history of Crohn's disease but terminal ileum was normal on colonoscopy of 2007 and 2010 as above and recent small  bowel study was normal. To complete her workup will proceed with abdominopelvic CT with contrast.   Plan:  Discontinue dicyclomine. Levbid 0.375 mg one by mouth every morning. Check serum TSH and sedimentation rate.. Dietary instructions to be given to the patient(diet for that here or overgrowth and bloating). Request records from [facility]. Office visit in 2 months

## 2011-08-26 ENCOUNTER — Telehealth (INDEPENDENT_AMBULATORY_CARE_PROVIDER_SITE_OTHER): Payer: Self-pay | Admitting: *Deleted

## 2011-08-26 NOTE — Telephone Encounter (Signed)
Patient called 08/25/11 to say that her Pharmacy did not have her prescription. I call [facility] this morning and they do have have it and it is ready for Terilyn to pick up. Her Insurance did not cover this medication and it will be $30.98. I called [facility] and left a voice message with this information, I also told her that I was mailing to her a copy of the Bacterial Overgrowth Diet. If she has any questions please call our office.

## 2011-08-30 ENCOUNTER — Ambulatory Visit (HOSPITAL_COMMUNITY): Payer: Medicaid Other

## 2011-08-30 ENCOUNTER — Telehealth (INDEPENDENT_AMBULATORY_CARE_PROVIDER_SITE_OTHER): Payer: Self-pay | Admitting: *Deleted

## 2011-08-30 NOTE — Telephone Encounter (Signed)
Patient was called and made aware that her Clinic Notes from [facility] had been reviewed by Dr.Rehman, and per Dr. Laural Golden there is no evidence of Crohn's disease.

## 2011-09-02 ENCOUNTER — Encounter (INDEPENDENT_AMBULATORY_CARE_PROVIDER_SITE_OTHER): Payer: Self-pay

## 2011-09-03 ENCOUNTER — Ambulatory Visit (HOSPITAL_COMMUNITY)
Admission: RE | Admit: 2011-09-03 | Discharge: 2011-09-03 | Disposition: A | Discharge status: Home / Self Care | Payer: Medicaid Other | Source: Ambulatory Visit | Attending: Internal Medicine | Admitting: Internal Medicine

## 2011-09-03 DIAGNOSIS — R143 Flatulence: Secondary | ICD-10-CM | POA: Insufficient documentation

## 2011-09-03 DIAGNOSIS — R1031 Right lower quadrant pain: Secondary | ICD-10-CM | POA: Insufficient documentation

## 2011-09-03 DIAGNOSIS — R141 Gas pain: Secondary | ICD-10-CM | POA: Insufficient documentation

## 2011-09-03 DIAGNOSIS — R142 Eructation: Secondary | ICD-10-CM | POA: Insufficient documentation

## 2011-09-03 MED ORDER — IOHEXOL 300 MG/ML  SOLN
100.0000 mL | Freq: Once | INTRAMUSCULAR | Status: AC | PRN
  Administered 2011-09-03: 100 mL via INTRAVENOUS

## 2011-09-13 ENCOUNTER — Telehealth (INDEPENDENT_AMBULATORY_CARE_PROVIDER_SITE_OTHER): Payer: Self-pay | Admitting: *Deleted

## 2011-09-13 NOTE — Telephone Encounter (Signed)
Per Dr.Rehman call in the following medication: Levbid 0.375 mg Take 1 by mouth daily #30 with 5 refills. This was called to [facility]

## 2011-09-23 ENCOUNTER — Encounter (HOSPITAL_COMMUNITY): Payer: Self-pay | Admitting: *Deleted

## 2011-09-23 ENCOUNTER — Emergency Department (HOSPITAL_COMMUNITY)
Admission: EM | Admit: 2011-09-23 | Discharge: 2011-09-23 | Disposition: A | Discharge status: Home / Self Care | Payer: Medicaid Other | Attending: Emergency Medicine | Admitting: Emergency Medicine

## 2011-09-23 DIAGNOSIS — L989 Disorder of the skin and subcutaneous tissue, unspecified: Secondary | ICD-10-CM

## 2011-09-23 DIAGNOSIS — R21 Rash and other nonspecific skin eruption: Secondary | ICD-10-CM | POA: Insufficient documentation

## 2011-09-23 DIAGNOSIS — IMO0002 Reserved for concepts with insufficient information to code with codable children: Secondary | ICD-10-CM | POA: Insufficient documentation

## 2011-09-23 MED ORDER — DOXYCYCLINE HYCLATE 100 MG PO CAPS: 100.0000 mg | ORAL_CAPSULE | Freq: Two times a day (BID) | ORAL | Status: AC

## 2011-09-23 NOTE — ED Notes (Signed)
Red area rt foot, noticed after mowing 6 days ago.  Painful ,burns and itches

## 2011-09-23 NOTE — ED Provider Notes (Signed)
History     CSN: [id number]  Arrival date & time 09/23/11  1432   First MD Initiated Contact with Patient 09/23/11 1604      Chief Complaint  Patient presents with  . Foot Pain    (Consider location/radiation/quality/duration/timing/severity/associated sxs/prior treatment) HPI Comments: Patient c/o pain, redness and itching to her right foot for one week.  Sx's began after mowing her lawn.  Concerned that she may have been bitten by an insect, also reports having a tick bite recently.  She also c/o generalized body aches.  She denies fever, abd pain, joint pain or other rashes.    Patient is a 41 y.o. female presenting with rash. The history is provided by the patient.  Rash  This is a new problem. The current episode started more than 2 days ago. The problem has not changed since onset.The problem is associated with an unknown factor. There has been no fever. The rash is present on the right foot. The pain is mild. The pain has been constant since onset. Associated symptoms include itching and pain. Pertinent negatives include no blisters and no weeping. She has tried nothing for the symptoms. The treatment provided no relief.    Past Medical History  Diagnosis Date  . Chest pain     normal stress echo 05/2007  . Palpitation     with tachycardia  . Tobacco abuse     25 yrs pack history 1/2 pack daily  . Hyperglycemia     fasting  . Anxiety   . Chronic low back pain   . GERD (gastroesophageal reflux disease)     EGD 01/2007 by Dr.Rourke small hiatal hernia s/p 3 french maloney   . IBS (irritable bowel syndrome)     chronic constipation h/o colon polyps in 2002 at [facility] per patient   . Closed head injury     memory disturbance  . Crohn's disease     Past Surgical History  Procedure Date  . Appendectomy 2006  . Abdominal hysterectomy     with right salpingo oophorectomy 2005  . Oophorectomy     left for torsion and ovarian fibroma; uterine myoma resected 1995  . Cesarean  section     1996  . Colonoscopy 2010    Dr. Gala Romney; negative except for hemorrhoids  . Cholecystectomy     History reviewed. No pertinent family history.  History  Substance Use Topics  . Smoking status: Current Everyday Smoker -- 0.3 packs/day for 20 years    Types: Cigarettes  . Smokeless tobacco: Never Used  . Alcohol Use: No    OB History    Grav Para Term Preterm Abortions TAB SAB Ect Mult Living                  Review of Systems  Constitutional: Positive for fatigue. Negative for fever, activity change and appetite change.  HENT: Negative for neck pain and neck stiffness.   Respiratory: Negative for cough and chest tightness.   Gastrointestinal: Negative for nausea and vomiting.  Genitourinary: Negative for dysuria.  Musculoskeletal: Positive for myalgias. Negative for arthralgias.  Skin: Positive for itching and rash.  Neurological: Negative for dizziness and headaches.  All other systems reviewed and are negative.    Allergies  Codeine; Penicillins; Benadryl; Omeprazole; Strawberry; Watermelon concentrate; and Latex  Home Medications   Current Outpatient Rx  Name Route Sig Dispense Refill  . CYCLOBENZAPRINE HCL 10 MG PO TABS Oral Take 10 mg by mouth 2 (two) times  daily as needed. For muscle realaxtion    . HYOSCYAMINE SULFATE ER 0.375 MG PO TB12 Oral Take 1 tablet (375 mcg total) by mouth daily before breakfast. 30 tablet 5  . METOPROLOL SUCCINATE ER 100 MG PO TB24  TAKE ONE TABLET BY MOUTH DAILY. 30 tablet 3  . OMEPRAZOLE 40 MG PO CPDR Oral Take 40 mg by mouth daily.      BP 130/67  Pulse 87  Temp 98.2 F (36.8 C)  Resp 20  Ht 5' 6"  (1.676 m)  Wt 152 lb (68.947 kg)  BMI 24.53 kg/m2  SpO2 98%  Physical Exam  Nursing note and vitals reviewed. Constitutional: She is oriented to person, place, and time. She appears well-developed and well-nourished. No distress.  HENT:  Head: Normocephalic and atraumatic.  Mouth/Throat: Oropharynx is clear and  moist.  Neck: Normal range of motion. Neck supple.  Cardiovascular: Normal rate, regular rhythm and normal heart sounds.   No murmur heard. Pulmonary/Chest: Effort normal and breath sounds normal.  Musculoskeletal: She exhibits tenderness. She exhibits no edema.       Right foot: She exhibits tenderness. She exhibits normal range of motion, no bony tenderness, no swelling, normal capillary refill, no crepitus, no deformity and no laceration.       Feet:  Neurological: She is alert and oriented to person, place, and time. She exhibits normal muscle tone. Coordination normal.  Skin: Skin is warm. There is erythema.       See MS exam    ED Course  Procedures (including critical care time)       MDM    Target lesion to the lateral right foot , has central clearing.  Recent tick bite.  Will treat with doxy for 14 days.  Pt agrees to close f/u or to return here if needed.    Patient / Family / Caregiver understand and agree with initial ED impression and plan with expectations set for ED visit. Pt stable in ED with no significant deterioration in condition. Pt feels improved after observation and/or treatment in ED.          Ninah L. Cook, [place] 09/29/11 1307

## 2011-09-23 NOTE — ED Notes (Signed)
Pt states had mowed the lawn a week prior to visit and believes received the red mark then. Pt has a raised red "bulls eye type rash on distal lateral foot.  Pt also c/o weakness, extreme tiredness, and joint aches. Denies SOB and chest pain.

## 2011-09-23 NOTE — ED Notes (Signed)
No answer when called 

## 2011-09-24 LAB — TSH: TSH: 0.599 u[IU]/mL (ref 0.350–4.500)

## 2011-09-29 NOTE — ED Provider Notes (Signed)
Medical screening examination/treatment/procedure(s) were performed by non-physician practitioner and as supervising physician I was immediately available for consultation/collaboration.   Ezequiel Essex, MD 09/29/11 1430

## 2011-11-01 ENCOUNTER — Encounter (INDEPENDENT_AMBULATORY_CARE_PROVIDER_SITE_OTHER): Payer: Self-pay | Admitting: Internal Medicine

## 2011-11-01 ENCOUNTER — Ambulatory Visit (INDEPENDENT_AMBULATORY_CARE_PROVIDER_SITE_OTHER): Payer: Medicaid Other | Admitting: Internal Medicine

## 2011-11-01 VITALS — BP 100/78 | HR 84 | Temp 98.1°F | Ht 66.0 in | Wt 149.0 lb

## 2011-11-01 DIAGNOSIS — G8929 Other chronic pain: Secondary | ICD-10-CM | POA: Insufficient documentation

## 2011-11-01 DIAGNOSIS — R1033 Periumbilical pain: Secondary | ICD-10-CM | POA: Insufficient documentation

## 2011-11-01 DIAGNOSIS — R1013 Epigastric pain: Secondary | ICD-10-CM

## 2011-11-01 DIAGNOSIS — R52 Pain, unspecified: Secondary | ICD-10-CM

## 2011-11-01 NOTE — Patient Instructions (Addendum)
Dexilant samples x 2 boxes given to patient. OV in 3 months. PR in 2 weeks.

## 2011-11-01 NOTE — Progress Notes (Signed)
Subjective:     Patient ID: [name], female   DOB: 1971-02-01, 41 y.o.   MRN: [id number]  [name] is a 41 yr old female here today for a scheduled visit.   She tells me she is having  periumblical burning sensation x 1 week  She describes as a constant burning.  She says now she is constipated. She is having a BM about every 3 days.  She als tells me she has 7 warts on her hands.  She has a hx of Crohn's disease that was diagnosed at [facility] back in 1998.  Appetite is bad. She says she has lost 9 pounds since her last visit. She has frequent acid reflux. No melena or bright red rectal bleeding Dr. Laural Golden tried her on Elavil but she was intolerant.  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    CT abdomen/pelvis with CM: IMPRESSION:  1. No evidence of active Crohn's disease. Terminal ileum is  normal.  2. Cholecystectomy.  3. Hysterectomy  Original Report Authenticated By: Suzy Bouchard, M.D.    Review of Systems Current Outpatient Prescriptions  Medication Sig Dispense Refill  . cyclobenzaprine (FLEXERIL) 10 MG tablet Take 10 mg by mouth 2 (two) times daily as needed. For muscle realaxtion      . metoprolol succinate (TOPROL-XL) 100 MG 24 hr tablet TAKE ONE TABLET BY MOUTH DAILY.  30 tablet  3  . omeprazole (PRILOSEC) 40 MG capsule Take 40 mg by mouth daily.      . hyoscyamine (LEVBID) 0.375 MG 12 hr tablet Take 1 tablet (375 mcg total) by mouth daily before breakfast.  30 tablet  5   Past Medical History  Diagnosis Date  . Chest pain     normal stress echo 05/2007  . Palpitation     with tachycardia  . Tobacco abuse     25 yrs pack history 1/2 pack daily  . Hyperglycemia     fasting  . Anxiety   . Chronic low back pain   . GERD (gastroesophageal reflux disease)     EGD 01/2007 by Dr.Rourke small hiatal hernia s/p 70 french maloney   . IBS (irritable bowel syndrome)     chronic constipation h/o colon polyps in 2002 at [facility] per  patient   . Closed head injury     memory disturbance  . Crohn's disease    Past Surgical History  Procedure Date  . Appendectomy 2006  . Abdominal hysterectomy     with right salpingo oophorectomy 2005  . Oophorectomy     left for torsion and ovarian fibroma; uterine myoma resected 1995  . Cesarean section     1996  . Colonoscopy 2010    Dr. Gala Romney; negative except for hemorrhoids  . Cholecystectomy    History   Social History  . Marital Status: Divorced    Spouse Name: N/A    Number of Children: N/A  . Years of Education: N/A   Occupational History  . Not on file.   Social History Main Topics  . Smoking status: Current Everyday Smoker -- 0.3 packs/day for 20 years    Types: Cigarettes  . Smokeless tobacco: Never Used  . Alcohol Use: No  . Drug Use: No  . Sexually Active: Yes    Birth Control/ Protection: Surgical   Other Topics Concern  . Not on file   Social History Narrative  . No narrative on file  Past Medical History  Diagnosis Date  . Chest pain     normal stress echo 05/2007  . Palpitation     with tachycardia  . Tobacco abuse     25 yrs pack history 1/2 pack daily  . Hyperglycemia     fasting  . Anxiety   . Chronic low back pain   . GERD (gastroesophageal reflux disease)     EGD 01/2007 by Dr.Rourke small hiatal hernia s/p 67 french maloney   . IBS (irritable bowel syndrome)     chronic constipation h/o colon polyps in 2002 at [facility] per patient   . Closed head injury     memory disturbance  . Crohn's disease    Allergies  Allergen Reactions  . Codeine Shortness Of Breath, Swelling and Rash    Throat swelling  . Penicillins Shortness Of Breath, Swelling and Other (See Comments)    Throat swells   . Benadryl (Diphenhydramine Hcl)   . Omeprazole     REACTION: abdominal cramps  . Strawberry Swelling  . Watermelon Concentrate Swelling  . Latex Rash       Objective:   Physical Exam Filed Vitals:   11/01/11 1027  Height: 5' 6"  (1.676  m)  Weight: 149 lb (67.586 kg)  Alert and oriented. Skin warm and dry. Oral mucosa is moist.   . Sclera anicteric, conjunctivae is pink. Thyroid not enlarged. No cervical lymphadenopathy. Lungs clear. Heart regular rate and rhythm.  Abdomen is soft. Bowel sounds are positive. No hepatomegaly. No abdominal masses felt. No tenderness.  No edema to lower extremities. Patient is alert and oriented.       Assessment:   Epigastric and periumblical burning, probably GERD, possibly from recently being on antibiotic for RMSF.     Plan:   Will try her on Dexilant samples to see how she does. She will have an OV in 3 months.

## 2011-11-10 ENCOUNTER — Encounter (INDEPENDENT_AMBULATORY_CARE_PROVIDER_SITE_OTHER): Payer: Self-pay

## 2011-11-20 ENCOUNTER — Other Ambulatory Visit: Payer: Self-pay | Admitting: Cardiology

## 2011-11-22 ENCOUNTER — Telehealth: Payer: Self-pay | Admitting: *Deleted

## 2011-11-22 NOTE — Telephone Encounter (Signed)
NEEDS APPOINTMENT

## 2011-11-22 NOTE — Telephone Encounter (Signed)
Appointment made for patient.  Past due and needs refills.

## 2011-11-23 ENCOUNTER — Ambulatory Visit (INDEPENDENT_AMBULATORY_CARE_PROVIDER_SITE_OTHER): Payer: Medicaid Other | Admitting: Internal Medicine

## 2011-11-25 ENCOUNTER — Encounter: Payer: Self-pay | Admitting: Adult Health

## 2011-11-25 ENCOUNTER — Ambulatory Visit (INDEPENDENT_AMBULATORY_CARE_PROVIDER_SITE_OTHER): Payer: Medicaid Other | Admitting: Adult Health

## 2011-11-25 VITALS — BP 118/82 | HR 73 | Ht 66.0 in | Wt 148.5 lb

## 2011-11-25 DIAGNOSIS — R079 Chest pain, unspecified: Secondary | ICD-10-CM

## 2011-11-25 DIAGNOSIS — R209 Unspecified disturbances of skin sensation: Secondary | ICD-10-CM

## 2011-11-25 DIAGNOSIS — R202 Paresthesia of skin: Secondary | ICD-10-CM

## 2011-11-25 DIAGNOSIS — R0789 Other chest pain: Secondary | ICD-10-CM

## 2011-11-25 MED ORDER — METOPROLOL SUCCINATE ER 100 MG PO TB24: 100.0000 mg | ORAL_TABLET | Freq: Every day | ORAL | Status: DC

## 2011-11-25 NOTE — Assessment & Plan Note (Signed)
She had a normal stress test per Dr. Lattie Haw in 2011. If chest pain continues, may consider cardiac catheterization for definitive evaluation of coronary anatomy. However this will be completed when all other options are exhausted.

## 2011-11-25 NOTE — Assessment & Plan Note (Signed)
She was seen by [facility], neurologist, in [place] in 2011. She did not have insurance at the time and therefore was unable to have further testing she is now on Medicaid and is able to proceed with testing. I will refer her to Dr. 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

## 2011-11-25 NOTE — Patient Instructions (Addendum)
Your physician has recommended that you have a sleep study. This test records several body functions during sleep, including: brain activity, eye movement, oxygen and carbon dioxide blood levels, heart rate and rhythm, breathing rate and rhythm, the flow of air through your mouth and nose, snoring, body muscle movements, and chest and belly movement.  Your physician recommends that you schedule a follow-up appointment in 1 month, after sleep study, with Jory Sims, NP.

## 2011-11-25 NOTE — Progress Notes (Signed)
HPI : [name] returns to the office as scheduled for continued assessment and treatment of palpitations.  She has noted progressive improvement with a gradual increase in her dose of metoprolol, currently 150 mg q.d., and is now experiencing minimal and very tolerable symptoms.  She notes a number of additional problems including paresthesias in a stocking and glove distribution that feel as if her hands and feet are asleep.  These occur when she is at rest, but not necessarily during sleep.  She also notes coldness in her extremities, which is relieved by movement.   Since last visit the patient did have a ABI completed to evaluate for her lower extremity tingling and pain. This revealed normal segmental study demonstrating no evidence of arterial occlusive disease at rest in either lower extremity. She continues to complain of chest tightness with numbness and tingling in her hands and legs. She admits to excessive caffeine use drinking with  several Mountain Dew soda day. She states she also notices that if she forgets to take metoprolol her heart rate goes up and she is feeling more palpitations.    She also complains of insomnia which she believes is related to stress and excessive caffeine use. However, she states that she has been having trouble sleeping over the last several years waking up often, and feeling tired the next day. She states that she does snore. She has not had a sleep study.    Current Outpatient Prescriptions on File Prior to Visit  Medication Sig Dispense Refill  . cyclobenzaprine (FLEXERIL) 10 MG tablet Take 10 mg by mouth 2 (two) times daily as needed. For muscle realaxtion      . metoprolol succinate (TOPROL-XL) 100 MG 24 hr tablet TAKE ONE TABLET BY MOUTH DAILY.  30 tablet  1  . omeprazole (PRILOSEC) 40 MG capsule Take 40 mg by mouth daily.         Allergies  Allergen Reactions  . Codeine Shortness Of Breath, Swelling and Rash    Throat swelling  . Penicillins  Shortness Of Breath, Swelling and Other (See Comments)    Throat swells   . Benadryl (Diphenhydramine Hcl)   . Omeprazole     REACTION: abdominal cramps  . Strawberry Swelling  . Watermelon Concentrate Swelling  . Latex Rash     .  ROS: Review of systems complete and found to be negative unless listed above  PHYSICAL EXAM: BP 118/82  Pulse 73  Ht 5' 6"  (1.676 m)  Wt 148 lb 8 oz (67.359 kg)  BMI 23.97 kg/m2  General-Well developed; no acute distress; hoarse voice Body habitus-proportionate weight and height Neck-No JVD; no carotid bruits Lungs-clear lung fields; resonant to percussion Cardiovascular-normal PMI; normal S1 and S2 Abdomen-normal bowel sounds; soft and non-tender without masses or organomegaly Musculoskeletal-No deformities, no cyanosis or clubbing Neurologic-Normal cranial nerves; symmetric strength and tone Skin-Warm, no significant lesions Extremities-distal pulses intact; no edema  Event Recorder: intermittent tachycardia with a rate as high as 170-appears to be sinus tachycardia  Rhythm strip:  Normal sinus rhythm at a rate of 65; normal PR interval.  Laboratory: CBC normal in 07/2010; ESR of 5; normal complete metabolic profile, TSH, and B12.  [place] is elevated, even above the postmenopausal range.  ASSESSMENT AND PLAN:

## 2011-11-25 NOTE — Assessment & Plan Note (Signed)
I believe this to be multifactorial. She drinks a lot of caffeine, does have some anxiety, and insomnia. I have discussed with her the need to decrease her caffeine use, I substituting caffeine free [facility], every other time, in weaning herself off. We will plan a sleep study to evaluate for sleep apnea in the setting of snoring and insomnia.

## 2011-11-30 ENCOUNTER — Ambulatory Visit (INDEPENDENT_AMBULATORY_CARE_PROVIDER_SITE_OTHER): Payer: Medicaid Other | Admitting: Internal Medicine

## 2011-12-02 ENCOUNTER — Telehealth: Payer: Self-pay | Admitting: Adult Health

## 2011-12-02 NOTE — Telephone Encounter (Signed)
Patient advised of recommendations below.  Please refer to Dr Simone Curia and schedule sleep study for [facility].

## 2011-12-02 NOTE — Telephone Encounter (Signed)
Patient wants know if she can have something to help her sleep for her sleep study.  Also has balance with Dr.Love and wants to know if we can refer her to different Neurologist. / tg

## 2011-12-02 NOTE — Telephone Encounter (Signed)
Nothing to help her sleep for sleep study. This will influence the test. Need to test her without sedatives. Can refer to [facility]. [facility] has all of her records, so she will have to request them to be moved once the appointment is made.

## 2011-12-02 NOTE — Telephone Encounter (Signed)
Please advise 

## 2011-12-27 ENCOUNTER — Ambulatory Visit: Payer: Medicaid Other | Admitting: Adult Health

## 2011-12-31 ENCOUNTER — Ambulatory Visit: Payer: Medicaid Other | Admitting: Adult Health

## 2012-01-12 ENCOUNTER — Ambulatory Visit: Payer: Medicaid Other | Attending: Adult Health | Admitting: Sleep Medicine

## 2012-01-12 DIAGNOSIS — G4710 Hypersomnia, unspecified: Secondary | ICD-10-CM | POA: Insufficient documentation

## 2012-01-12 DIAGNOSIS — G4730 Sleep apnea, unspecified: Secondary | ICD-10-CM | POA: Insufficient documentation

## 2012-01-13 ENCOUNTER — Telehealth: Payer: Self-pay | Admitting: *Deleted

## 2012-01-13 NOTE — Telephone Encounter (Signed)
LMTCB re appt. Today Horton Chin RN

## 2012-01-14 ENCOUNTER — Ambulatory Visit: Payer: Medicaid Other | Admitting: Adult Health

## 2012-01-14 NOTE — Telephone Encounter (Signed)
Thank you :)

## 2012-01-14 NOTE — Telephone Encounter (Signed)
I have called pt to reschedule her appointment to discuss sleep study results. Her sleep study test was performed on 01/12/12 & results are unavailable. Pt is agreeable with this and does not have any cardiac complaints at this time. We will reschedule her appt and return call later today. Horton Chin RN

## 2012-01-16 NOTE — Procedures (Signed)
[place] A. Merlene Laughter, MD     [email or web address]         NAMEEUSTOLIA, DRENNEN                  ACCOUNT NO.:  [id number]  MEDICAL RECORD NO.:  [id number]          PATIENT TYPE:  OUT  LOCATION:  SLEEP LAB                     FACILITY:  [facility]  PHYSICIAN:  Alquan Morrish A. Merlene Laughter, M.D. DATE OF BIRTH:  July 04, 1970  DATE OF STUDY:  01/12/2012                           NOCTURNAL POLYSOMNOGRAM  REFERRING PHYSICIAN:  Cristopher Estimable. Lattie Haw, MD, [facility]  INDICATION:  A 41 year old who presents with fatigue and witnessed apnea and snoring.  The study is being done to evaluate for obstructive sleep apnea syndrome.  MEDICATIONS:  Metoprolol.  EPWORTH SLEEPINESS SCALE:  6.  BMI 24.  ARCHITECTURAL SUMMARY:  The total recording time is 363 minutes, sleep efficiency 96%, sleep latency 0.5 minutes.  REM latency 127 minutes. Stage N1 is 2%, N2 80%, N3 7%, and REM sleep 11%.  RESPIRATORY SUMMARY:  Baseline oxygen saturation is 95, lowest saturation 89 during non-REM sleep.  Diagnostic AHI is 2 and RDI 3.  LIMB MOVEMENT SUMMARY:  PLM index 0.  ELECTROCARDIOGRAM SUMMARY:  Average heart rate is 70 with no significant dysrhythmias observed.  IMPRESSION:  Mild sleep architecture with early sleep latency, otherwise unremarkable polysomnography.  Given the hypersomnia and lack of explanation from this study, Sleep consultation is suggested.  Thanks for this referral.    Glennys Schorsch A. Merlene Laughter, M.D.    KAD/MEDQ  D:  01/15/2012 18:31:31  T:  01/16/2012 08:33:40  Job:  [id number]

## 2012-01-19 ENCOUNTER — Other Ambulatory Visit: Payer: Self-pay | Admitting: Cardiology

## 2012-01-26 ENCOUNTER — Ambulatory Visit (INDEPENDENT_AMBULATORY_CARE_PROVIDER_SITE_OTHER): Payer: Medicaid Other | Admitting: Physician Assistant

## 2012-01-26 ENCOUNTER — Encounter: Payer: Self-pay | Admitting: Physician Assistant

## 2012-01-26 VITALS — BP 118/81 | HR 81 | Wt 154.0 lb

## 2012-01-26 DIAGNOSIS — R0609 Other forms of dyspnea: Secondary | ICD-10-CM | POA: Insufficient documentation

## 2012-01-26 DIAGNOSIS — R0600 Dyspnea, unspecified: Secondary | ICD-10-CM | POA: Insufficient documentation

## 2012-01-26 DIAGNOSIS — R002 Palpitations: Secondary | ICD-10-CM

## 2012-01-26 NOTE — Assessment & Plan Note (Signed)
Patient is short of breath when she lays down at night. She had a normal stress echo in March 2012. Her sleep study was equivocal and she will see Dr. Luan Pulling in followup. She is urged to quit smoking and lose weight.

## 2012-01-26 NOTE — Progress Notes (Signed)
HPI:   This is a 41 year old female patient who is here for followup after undergoing a sleep study ordered by Andres Shad, NP.This showed mild sleep architecture with early sleep latency otherwise unremarkable. Given her hypersomnia and lack of explanation from the study sleep consult was recommended.She has a history of palpitations and chest pain. She had a normal stress echo in March 2012, With normal LV function ejection fraction 65-70%.  The patient's main symptoms occur when she lays down at night. She says she feels like she can't breathe and then develops palpitations and anxiety. She continues to smoke a half a pack of cigarettes a day. Her mother died of lung cancer.     Allergies: -- Codeine -- Shortness Of Breath, Swelling and                           Rash   --  Throat swelling  -- Penicillins -- Shortness Of Breath, Swelling and                           Other (See Comments)   --  Throat swells  -- Benadryl (Diphenhydramine Hcl)   -- Omeprazole    --  REACTION: abdominal cramps  -- Strawberry -- Swelling  -- Watermelon Concentrate -- Swelling  -- Latex -- Rash  Current Outpatient Prescriptions on File Prior to Visit: cyclobenzaprine (FLEXERIL) 10 MG tablet, Take 10 mg by mouth 2 (two) times daily as needed. For muscle realaxtion, Disp: , Rfl:  metoprolol succinate (TOPROL-XL) 100 MG 24 hr tablet, TAKE ONE TABLET BY MOUTH DAILY., Disp: 30 tablet, Rfl: 5 omeprazole (PRILOSEC) 40 MG capsule, Take 40 mg by mouth daily., Disp: , Rfl:     Past Medical History:   Chest pain                                                     Comment:normal stress echo 05/2007   Palpitation                                                    Comment:with tachycardia   Tobacco abuse                                                  Comment:25 yrs pack history 1/2 pack daily   Hyperglycemia                                                  Comment:fasting   Anxiety                                                       Chronic low back  pain                                        GERD (gastroesophageal reflux disease)                         Comment:EGD 01/2007 by Dr.Rourke small hiatal hernia s/p              56 french maloney    IBS (irritable bowel syndrome)                                 Comment:chronic constipation h/o colon polyps in 2002               at [facility] per patient    Closed head injury                                             Comment:memory disturbance   Crohn's disease                                             Past Surgical History:   APPENDECTOMY                                    2006         ABDOMINAL HYSTERECTOMY                                         Comment:with right salpingo oophorectomy 2005   OOPHORECTOMY                                                   Comment:left for torsion and ovarian fibroma; uterine               myoma resected [place]                                     2010           Comment:Dr. Rourk; negative except for hemorrhoids   CHOLECYSTECTOMY                                             No family history on file.   Social History   Marital Status: Divorced  Spouse Name:                      Years of Education:                 Number of children:             Occupational History   None on file  Social History Main Topics   Smoking Status: Current Every Day Smoker        Packs/Day: .3    Years: 20        Types: Cigarettes   Smokeless Status: Never Used                       Alcohol Use: No             Drug Use: No             Sexual Activity: Yes                    Birth Control/Protection: Surgical  Other Topics            Concern   None on file  Social History Narrative   None on file    ROS:see history of present illness otherwise negative   PHYSICAL EXAM: Well-nournished, in no acute distress. Neck: No  JVD, HJR, Bruit, or thyroid enlargement  Lungs: No tachypnea, clear without wheezing, rales, or rhonchi  Cardiovascular: RRR, PMI not displaced, positive S4, no murmur. bruit, thrill, or heave.  Abdomen: BS normal. Soft without organomegaly, masses, lesions or tenderness.  Extremities: without cyanosis, clubbing or edema. Good distal pulses bilateral  SKin: Warm, no lesions or rashes   Musculoskeletal: No deformities  Neuro: no focal signs  BP 118/81  Pulse 81  Wt 154 lb (69.854 kg)         Sleep study:   IMPRESSION:  Mild sleep architecture with early sleep latency, otherwise unremarkable polysomnography.  Given the hypersomnia and lack of explanation from this study, Sleep consultation is suggested.

## 2012-01-26 NOTE — Assessment & Plan Note (Signed)
Palpitations only occur when she lays down at night and has trouble breathing. Her sleep study was equivocal and they recommend seeing a sleep specialist. We will make her an appointment to see Dr. Luan Pulling who is her primary doctor. She has cut back her caffeine intake to 2 cups daily. I've urged her to quit smoking and lose weight in hopes that this would help her dyspnea

## 2012-01-26 NOTE — Patient Instructions (Addendum)
Your physician recommends that you schedule a follow-up appointment in: As needed

## 2012-04-04 DIAGNOSIS — I471 Supraventricular tachycardia, unspecified: Secondary | ICD-10-CM | POA: Insufficient documentation

## 2012-04-04 DIAGNOSIS — M47816 Spondylosis without myelopathy or radiculopathy, lumbar region: Secondary | ICD-10-CM | POA: Insufficient documentation

## 2012-04-04 DIAGNOSIS — F17200 Nicotine dependence, unspecified, uncomplicated: Secondary | ICD-10-CM | POA: Insufficient documentation

## 2012-04-11 DIAGNOSIS — E785 Hyperlipidemia, unspecified: Secondary | ICD-10-CM | POA: Insufficient documentation

## 2012-04-23 ENCOUNTER — Encounter (HOSPITAL_COMMUNITY): Payer: Self-pay | Admitting: Emergency Medicine

## 2012-04-23 ENCOUNTER — Other Ambulatory Visit: Payer: Self-pay

## 2012-04-23 ENCOUNTER — Emergency Department (HOSPITAL_COMMUNITY): Payer: Medicaid Other

## 2012-04-23 ENCOUNTER — Emergency Department (HOSPITAL_COMMUNITY)
Admission: EM | Admit: 2012-04-23 | Discharge: 2012-04-23 | Disposition: A | Discharge status: Home / Self Care | Payer: Medicaid Other | Attending: Emergency Medicine | Admitting: Emergency Medicine

## 2012-04-23 DIAGNOSIS — R071 Chest pain on breathing: Secondary | ICD-10-CM | POA: Insufficient documentation

## 2012-04-23 DIAGNOSIS — Z9089 Acquired absence of other organs: Secondary | ICD-10-CM | POA: Insufficient documentation

## 2012-04-23 DIAGNOSIS — Z79899 Other long term (current) drug therapy: Secondary | ICD-10-CM | POA: Insufficient documentation

## 2012-04-23 DIAGNOSIS — Z3202 Encounter for pregnancy test, result negative: Secondary | ICD-10-CM | POA: Insufficient documentation

## 2012-04-23 DIAGNOSIS — N390 Urinary tract infection, site not specified: Secondary | ICD-10-CM | POA: Insufficient documentation

## 2012-04-23 DIAGNOSIS — F17200 Nicotine dependence, unspecified, uncomplicated: Secondary | ICD-10-CM | POA: Insufficient documentation

## 2012-04-23 DIAGNOSIS — Z8679 Personal history of other diseases of the circulatory system: Secondary | ICD-10-CM | POA: Insufficient documentation

## 2012-04-23 DIAGNOSIS — R42 Dizziness and giddiness: Secondary | ICD-10-CM | POA: Insufficient documentation

## 2012-04-23 DIAGNOSIS — F419 Anxiety disorder, unspecified: Secondary | ICD-10-CM

## 2012-04-23 DIAGNOSIS — Z8719 Personal history of other diseases of the digestive system: Secondary | ICD-10-CM | POA: Insufficient documentation

## 2012-04-23 DIAGNOSIS — K219 Gastro-esophageal reflux disease without esophagitis: Secondary | ICD-10-CM | POA: Insufficient documentation

## 2012-04-23 DIAGNOSIS — G8929 Other chronic pain: Secondary | ICD-10-CM | POA: Insufficient documentation

## 2012-04-23 DIAGNOSIS — F411 Generalized anxiety disorder: Secondary | ICD-10-CM | POA: Insufficient documentation

## 2012-04-23 DIAGNOSIS — Z8781 Personal history of (healed) traumatic fracture: Secondary | ICD-10-CM | POA: Insufficient documentation

## 2012-04-23 DIAGNOSIS — R0789 Other chest pain: Secondary | ICD-10-CM

## 2012-04-23 LAB — URINE MICROSCOPIC-ADD ON

## 2012-04-23 LAB — BASIC METABOLIC PANEL
Chloride: 102 mEq/L (ref 96–112)
GFR calc non Af Amer: 67 mL/min — ABNORMAL LOW (ref >90)
Glucose, Bld: 96 mg/dL (ref 70–99)
Potassium: 3.4 mEq/L — ABNORMAL LOW (ref 3.5–5.1)
Sodium: 139 mEq/L (ref 135–145)

## 2012-04-23 LAB — CBC WITH DIFFERENTIAL/PLATELET
Eosinophils Absolute: 0.2 10*3/uL (ref 0.0–0.7)
Hemoglobin: 13.7 g/dL (ref 12.0–15.0)
Lymphocytes Relative: 28 % (ref 12–46)
Lymphs Abs: 2.2 10*3/uL (ref 0.7–4.0)
MCH: 30.6 pg (ref 26.0–34.0)
Monocytes Relative: 6 % (ref 3–12)
Neutro Abs: 5.1 10*3/uL (ref 1.7–7.7)
Neutrophils Relative %: 64 % (ref 43–77)
Platelets: 332 10*3/uL (ref 150–400)
RBC: 4.48 MIL/uL (ref 3.87–5.11)
WBC: 7.9 10*3/uL (ref 4.0–10.5)

## 2012-04-23 LAB — URINALYSIS, ROUTINE W REFLEX MICROSCOPIC
Glucose, UA: NEGATIVE mg/dL
Protein, ur: NEGATIVE mg/dL
Specific Gravity, Urine: 1.02 (ref 1.005–1.030)
Urobilinogen, UA: 0.2 mg/dL (ref 0.0–1.0)

## 2012-04-23 LAB — PREGNANCY, URINE: Preg Test, Ur: NEGATIVE

## 2012-04-23 MED ORDER — IOHEXOL 350 MG/ML SOLN
80.0000 mL | Freq: Once | INTRAVENOUS | Status: AC | PRN
  Administered 2012-04-23: 80 mL via INTRAVENOUS

## 2012-04-23 MED ORDER — NITROFURANTOIN MONOHYD MACRO 100 MG PO CAPS: 100.0000 mg | ORAL_CAPSULE | Freq: Two times a day (BID) | ORAL | Status: DC

## 2012-04-23 MED ORDER — SODIUM CHLORIDE 0.9 % IV SOLN: INTRAVENOUS | Status: DC

## 2012-04-23 MED ORDER — ONDANSETRON 4 MG PO TBDP
4.0000 mg | ORAL_TABLET | Freq: Once | ORAL | Status: AC
Start: 2012-04-23 — End: 2012-04-23
  Administered 2012-04-23: 4 mg via ORAL
  Filled 2012-04-23: qty 1

## 2012-04-23 MED ORDER — ONDANSETRON HCL 4 MG PO TABS: 4.0000 mg | ORAL_TABLET | Freq: Three times a day (TID) | ORAL | Status: DC | PRN

## 2012-04-23 MED ORDER — SODIUM CHLORIDE 0.9 % IV BOLUS (SEPSIS)
1000.0000 mL | Freq: Once | INTRAVENOUS | Status: AC
  Administered 2012-04-23: 1000 mL via INTRAVENOUS

## 2012-04-23 NOTE — ED Notes (Signed)
Pt brought in via EMS. Called to home for dizziness and chest pain. 12 lead NSR. EKG on arrival NSR. Pt states she had lifted her grandson this am and put her head back, suddenly got dizzy. Pt states when she awoke at approx 0730 she was having "some chest tightness."

## 2012-04-23 NOTE — ED Provider Notes (Signed)
History     CSN: [id number]  Arrival date & time 04/23/12  1708   First MD Initiated Contact with Patient 04/23/12 1841      Chief Complaint  Patient presents with  . Dizziness  . Chest Pain     HPI Pt was seen at 1905.   Per pt, c/o gradual onset and persistence of constant mid-sternal chest "pain" that began when she woke up this morning at 0730.  Also c/o feeling "dizzy" and "lightheaded" after she lifted up her grandson and tilted her head back.  Denies palpitations, no SOB/cough, no abd pain, no neck or back pain, no visual changes, no focal motor weakness, no tingling/numbness in extremities, no fevers.     Past Medical History  Diagnosis Date  . Chest pain     normal stress echo 05/2007  . Palpitation     with tachycardia  . Tobacco abuse     25 yrs pack history 1/2 pack daily  . Hyperglycemia     fasting  . Anxiety   . Chronic low back pain   . GERD (gastroesophageal reflux disease)     EGD 01/2007 by Dr.Rourke small hiatal hernia s/p 13 french maloney   . IBS (irritable bowel syndrome)     chronic constipation h/o colon polyps in 2002 at [facility] per patient   . Closed head injury     memory disturbance  . Crohn's disease     Past Surgical History  Procedure Date  . Appendectomy 2006  . Abdominal hysterectomy     with right salpingo oophorectomy 2005  . Oophorectomy     left for torsion and ovarian fibroma; uterine myoma resected 1995  . Cesarean section     1996  . Colonoscopy 2010    Dr. Gala Romney; negative except for hemorrhoids  . Cholecystectomy     Family History  Problem Relation Age of Onset  . Cancer Mother   . Heart failure Mother   . Hypertension Mother   . Cancer Father   . Heart failure Father   . Diabetes Father     History  Substance Use Topics  . Smoking status: Current Every Day Smoker -- 0.3 packs/day for 20 years    Types: Cigarettes  . Smokeless tobacco: Never Used  . Alcohol Use: No    OB History    Grav Para Term Preterm  Abortions TAB SAB Ect Mult Living   3 1 1  2  2          Review of Systems ROS: Statement: All systems negative except as marked or noted in the HPI; Constitutional: Negative for fever and chills. ; ; Eyes: Negative for eye pain, redness and discharge. ; ; ENMT: Negative for ear pain, hoarseness, nasal congestion, sinus pressure and sore throat. ; ; Cardiovascular: +CP. Negative for palpitations, diaphoresis, dyspnea and peripheral edema. ; ; Respiratory: Negative for cough, wheezing and stridor. ; ; Gastrointestinal: Negative for nausea, vomiting, diarrhea, abdominal pain, blood in stool, hematemesis, jaundice and rectal bleeding. . ; ; Genitourinary: Negative for dysuria, flank pain and hematuria. ; ; Musculoskeletal: Negative for back pain and neck pain. Negative for swelling and trauma.; ; Skin: Negative for pruritus, rash, abrasions, blisters, bruising and skin lesion.; ; Neuro: +"dizzy," lightheadedness. Negative for headache and neck stiffness. Negative for weakness, altered level of consciousness , altered mental status, extremity weakness, paresthesias, involuntary movement, seizure and syncope.; Psych:  +anxiety. No SI, no SA, no HI, no hallucinations.  Allergies  Codeine; Penicillins; Benadryl; Omeprazole; Strawberry; Watermelon concentrate; and Latex  Home Medications   Current Outpatient Rx  Name  Route  Sig  Dispense  Refill  . FENOFIBRATE 145 MG PO TABS   Oral   Take 145 mg by mouth daily.         . IPRATROPIUM BROMIDE HFA 17 MCG/ACT IN AERS   Inhalation   Inhale 2 puffs into the lungs every 6 (six) hours as needed. Shortness of breath         . METHOCARBAMOL 500 MG PO TABS   Oral   Take 500 mg by mouth 4 (four) times daily as needed. Muscle spasm         . METOPROLOL SUCCINATE ER 100 MG PO TB24   Oral   Take 100 mg by mouth daily. Take with or immediately following a meal.         . MIRTAZAPINE 45 MG PO TABS   Oral   Take 45 mg by mouth at bedtime.          . OMEPRAZOLE 40 MG PO CPDR   Oral   Take 40 mg by mouth daily.         Marland Kitchen TEMAZEPAM 15 MG PO CAPS   Oral   Take 15 mg by mouth at bedtime as needed. sleep           BP 117/69  Pulse 75  Temp 98 F (36.7 C) (Oral)  Resp 20  Ht 5' 6"  (1.676 m)  Wt 158 lb (71.668 kg)  BMI 25.50 kg/m2  SpO2 100%  Physical Exam 1910: Physical examination:  Nursing notes reviewed; Vital signs and O2 SAT reviewed;  Constitutional: Well developed, Well nourished, Well hydrated, In no acute distress; Head:  Normocephalic, atraumatic; Eyes: EOMI, PERRL, No scleral icterus; ENMT: TM's clear bilat. +edemetous nasal turbinates bilat with clear rhinorrhea. Mouth and pharynx normal, Mucous membranes moist; Neck: Supple, Full range of motion, No lymphadenopathy; Cardiovascular: Regular rate and rhythm, No murmur, rub, or gallop; Respiratory: Breath sounds clear & equal bilaterally, No rales, rhonchi, wheezes.  Speaking full sentences with ease, Normal respiratory effort/excursion; Chest: Nontender, Movement normal; Abdomen: Soft, Nontender, Nondistended, Normal bowel sounds; Genitourinary: No CVA tenderness; Spine:  No midline CS, TS, LS tenderness.;; Extremities: Pulses normal, No tenderness, No edema, No calf edema or asymmetry.; Neuro: AA&Ox3, Major CN grossly intact.  Strength 5/5 equal bilat UE's and LE's.  DTR 2/4 equal bilat UE's and LE's.  No gross sensory deficits.  Normal cerebellar testing bilat UE's (finger-nose) and LE's (heel-shin). Speech clear.  No facial droop.  No nystagmus.; Skin: Color normal, Warm, Dry.; Psych:  Anxious.    ED Course  Procedures    MDM  MDM Reviewed: nursing note, vitals and previous chart Interpretation: ECG, labs, x-ray and CT scan    Date: 04/23/2012  Rate: 75  Rhythm: normal sinus rhythm  QRS Axis: normal  Intervals: normal  ST/T Wave abnormalities: normal  Conduction Disutrbances:none  Narrative Interpretation:   Old EKG Reviewed: none  available.  Results for orders placed during the hospital encounter of 04/23/12  CBC WITH DIFFERENTIAL      Component Value Range   WBC 7.9  4.0 - 10.5 K/uL   RBC 4.48  3.87 - 5.11 MIL/uL   Hemoglobin 13.7  12.0 - 15.0 g/dL   HCT 40.2  36.0 - 46.0 %   MCV 89.7  78.0 - 100.0 fL   MCH 30.6  26.0 - 34.0 pg  MCHC 34.1  30.0 - 36.0 g/dL   RDW 12.6  11.5 - 15.5 %   Platelets 332  150 - 400 K/uL   Neutrophils Relative 64  43 - 77 %   Neutro Abs 5.1  1.7 - 7.7 K/uL   Lymphocytes Relative 28  12 - 46 %   Lymphs Abs 2.2  0.7 - 4.0 K/uL   Monocytes Relative 6  3 - 12 %   Monocytes Absolute 0.5  0.1 - 1.0 K/uL   Eosinophils Relative 2  0 - 5 %   Eosinophils Absolute 0.2  0.0 - 0.7 K/uL   Basophils Relative 0  0 - 1 %   Basophils Absolute 0.0  0.0 - 0.1 K/uL  BASIC METABOLIC PANEL      Component Value Range   Sodium 139  135 - 145 mEq/L   Potassium 3.4 (*) 3.5 - 5.1 mEq/L   Chloride 102  96 - 112 mEq/L   CO2 30  19 - 32 mEq/L   Glucose, Bld 96  70 - 99 mg/dL   BUN 14  6 - 23 mg/dL   Creatinine, Ser 1.01  0.50 - 1.10 mg/dL   Calcium 9.3  8.4 - 10.5 mg/dL   GFR calc non Af Amer 67 (*) >90 mL/min   GFR calc Af Amer 78 (*) >90 mL/min  TROPONIN I      Component Value Range   Troponin I <0.30  <0.30 ng/mL  URINALYSIS, ROUTINE W REFLEX MICROSCOPIC      Component Value Range   Color, Urine YELLOW  YELLOW   APPearance CLOUDY (*) CLEAR   Specific Gravity, Urine 1.020  1.005 - 1.030   pH 7.5  5.0 - 8.0   Glucose, UA NEGATIVE  NEGATIVE mg/dL   Hgb urine dipstick NEGATIVE  NEGATIVE   Bilirubin Urine NEGATIVE  NEGATIVE   Ketones, ur TRACE (*) NEGATIVE mg/dL   Protein, ur NEGATIVE  NEGATIVE mg/dL   Urobilinogen, UA 0.2  0.0 - 1.0 mg/dL   Nitrite POSITIVE (*) NEGATIVE   Leukocytes, UA TRACE (*) NEGATIVE  PREGNANCY, URINE      Component Value Range   Preg Test, Ur NEGATIVE  NEGATIVE  URINE MICROSCOPIC-ADD ON      Component Value Range   Squamous Epithelial / LPF FEW (*) RARE   WBC, UA  21-50  <3 WBC/hpf   Bacteria, UA MANY (*) RARE   Dg Chest 2 View 04/23/2012  *RADIOLOGY REPORT*  Clinical Data: Dizziness, weakness  CHEST - 2 VIEW  Comparison: 05/23/2011  Findings: Normal heart size, mediastinal contours, pulmonary vascularity. Lungs clear. No pleural effusion or pneumothorax. No acute osseous findings.  IMPRESSION: No acute abnormalities.   Original Report Authenticated By: Lavonia Dana, M.D.    Ct Head Wo Contrast 04/23/2012  *RADIOLOGY REPORT*  Clinical Data: Fall, felt a pop, headache  CT HEAD WITHOUT CONTRAST  Technique:  Contiguous axial images were obtained from the base of the skull through the vertex without contrast.  Comparison: 07/29/2009  Findings: Normal ventricular morphology. No midline shift or mass effect. Normal appearance of brain parenchyma. No intracranial hemorrhage, mass lesion or evidence of acute infarction. No extra-axial fluid collections. Mucosal thickening sphenoid sinus. Bones unremarkable.  IMPRESSION: No acute intracranial abnormalities.   Original Report Authenticated By: Lavonia Dana, M.D.     CTA neck: verbal report from Rads MD is negative.     2230:  Pt states she feels "better now" and wants to go home.  Not  orthostatic. Has ambulated in the ED with steady gait. Has tol PO well while in the ED without vomiting, though now states she is nauseated.  Will dose zofran.  Doubt ACS as cause for pain given normal EKG and troponin after 12+ hours of constant symptoms.  Doubt PE with low risk Well's.  +UTI, UC pending; will treat.  Multiple family members at bedside now: state pt has been under "a lot of stress" and "anxious" for the past several days.  Pt concurs, stating she "thinks this was just my anxiety."  Wants to go home now. Has gotten herself dressed and is sitting on the side of the stretcher.  Dx and testing d/w pt and family.  Questions answered.  Verb understanding, agreeable to d/c home with outpt f/u.        Alfonzo Feller,  DO 04/25/12 2240

## 2012-04-23 NOTE — ED Notes (Signed)
[place] for pt to take home GERD medication.

## 2012-04-25 LAB — URINE CULTURE: Colony Count: >=100000

## 2012-04-26 NOTE — ED Notes (Signed)
+   Urine Patient treated with Macrobid-sensitive to same-chart appended per protocol MD.

## 2012-05-09 DIAGNOSIS — S0990XA Unspecified injury of head, initial encounter: Secondary | ICD-10-CM | POA: Insufficient documentation

## 2012-05-20 IMAGING — RF DG ESOPHAGUS
13 of 24 series · 13 of 24 positions shown · non-contrast
Comparison: None.

CLINICAL DATA: Dysphagia.

ESOPHOGRAM/BARIUM SWALLOW
TECHNIQUE: Combined double contrast and single contrast
examination performed using effervescent crystals, thick barium
liquid, and thin barium liquid.
Fluoroscopy time:  3.6 minutes.

[Series 1: run · 1 of 1 slices shown (1 of 13)]
[im 1/1]
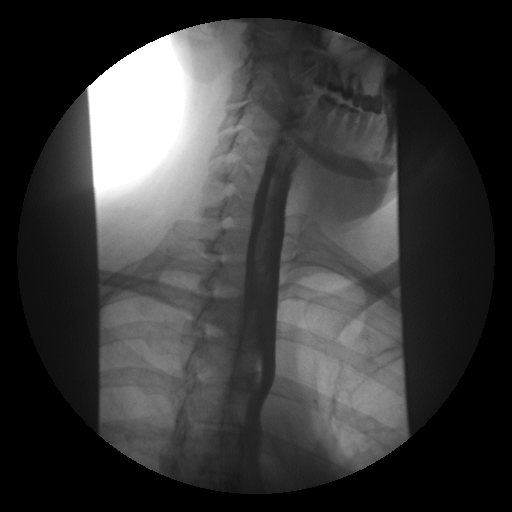

[Series 3: run · 1 of 2 slices shown (2 of 13)]
[im 1/2]
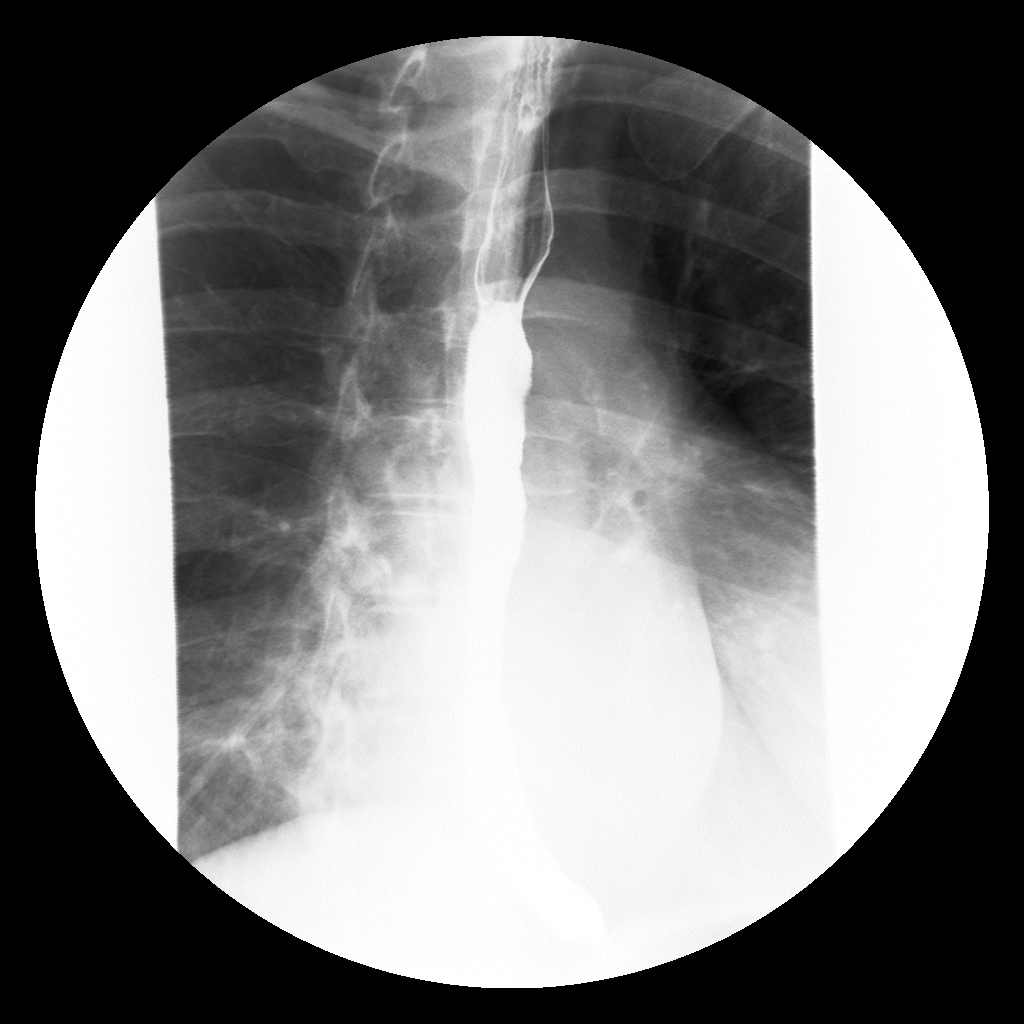

[Series 5: run · 1 of 1 slices shown (3 of 13)]
[im 1/1]
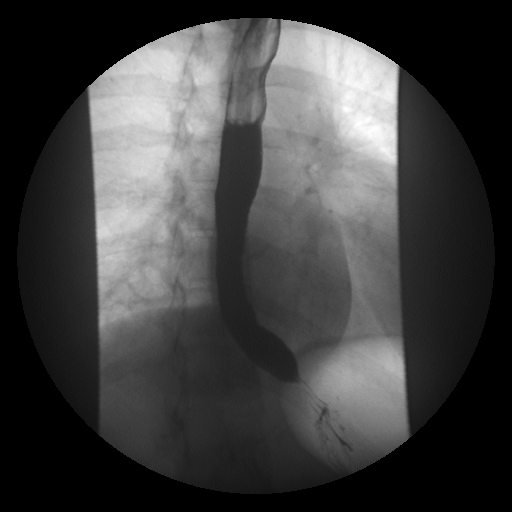

[Series 7: run · 1 of 1 slices shown (4 of 13)]
[im 1/1]
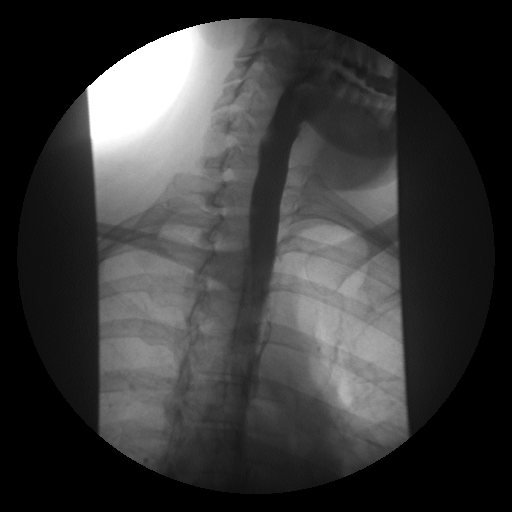

[Series 9: run · 1 of 1 slices shown (5 of 13)]
[im 1/1]
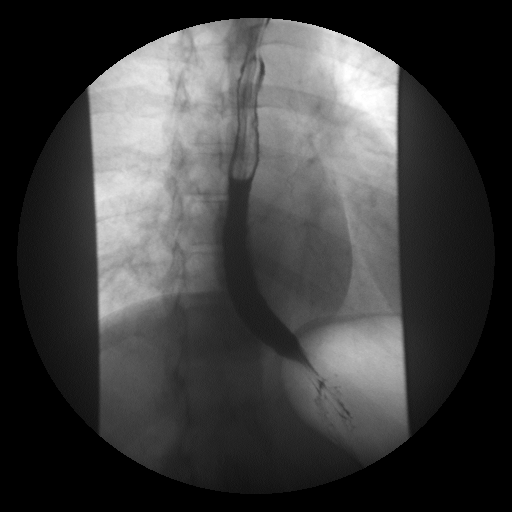

[Series 11: run · 1 of 1 slices shown (6 of 13)]
[im 1/1]
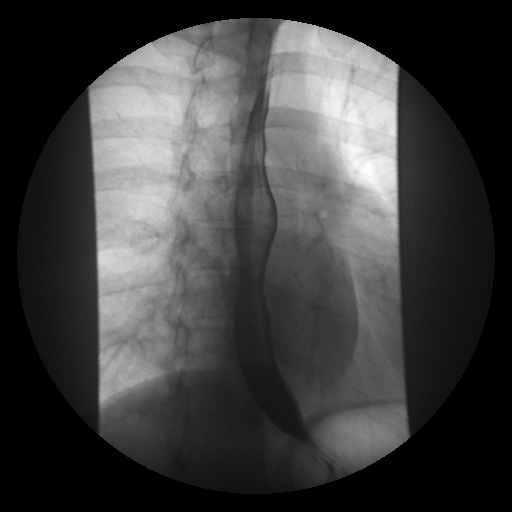

[Series 13: run · 1 of 3 slices shown (7 of 13)]
[im 1/3]
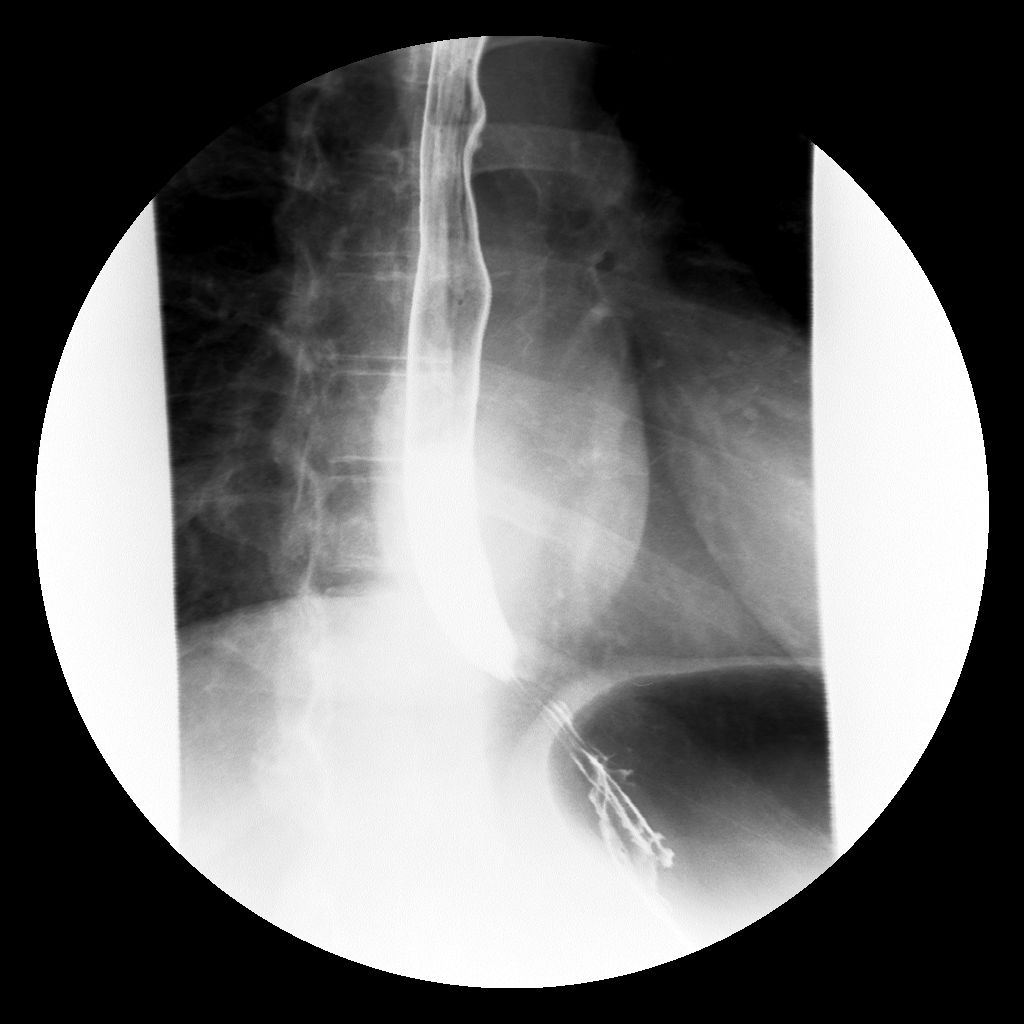

[Series 14: run · 1 of 1 slices shown (8 of 13)]
[im 1/1]
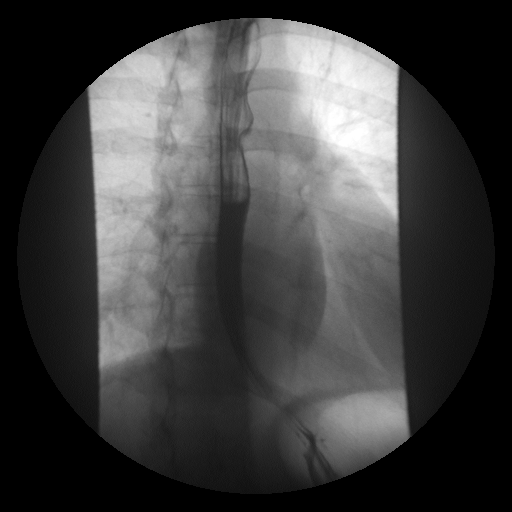

[Series 16: run · 1 of 1 slices shown (9 of 13)]
[im 1/1]
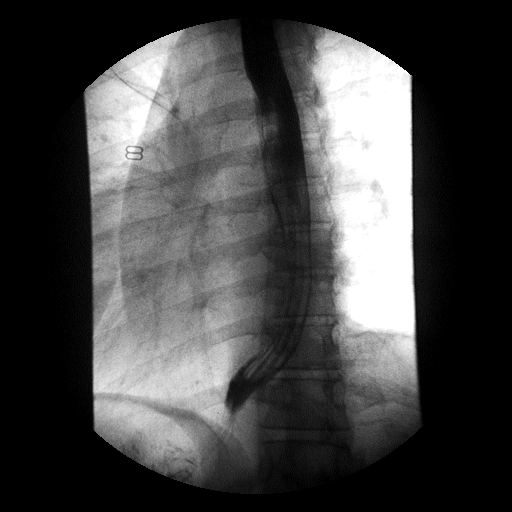

[Series 18: run · 1 of 1 slices shown (10 of 13)]
[im 1/1]
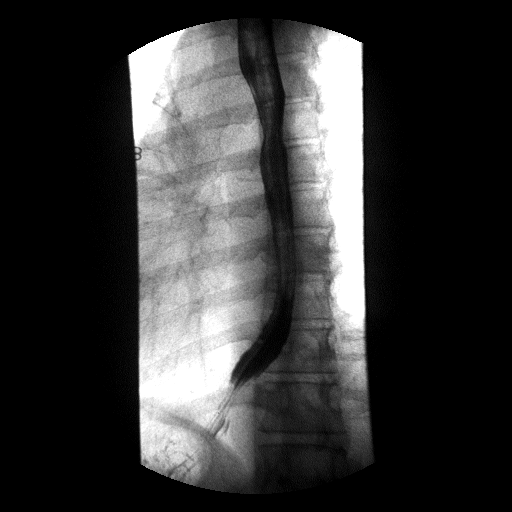

[Series 20: run · 1 of 1 slices shown (11 of 13)]
[im 1/1]
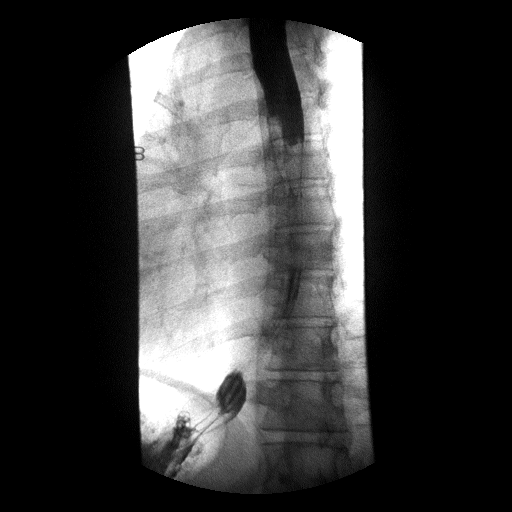

[Series 22: run · 1 of 1 slices shown (12 of 13)]
[im 1/1]
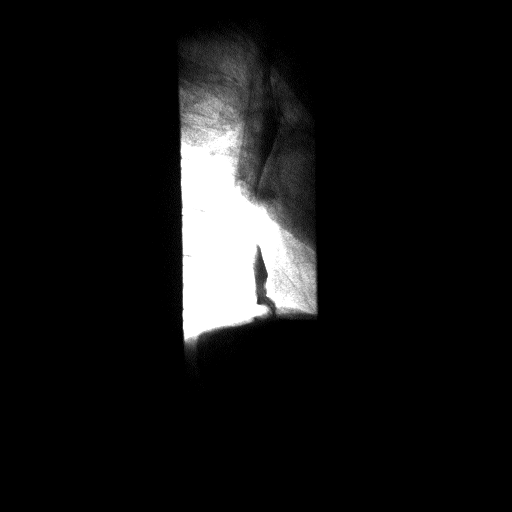

[Series 24: run · 1 of 1 slices shown (13 of 13)]
[im 1/1]
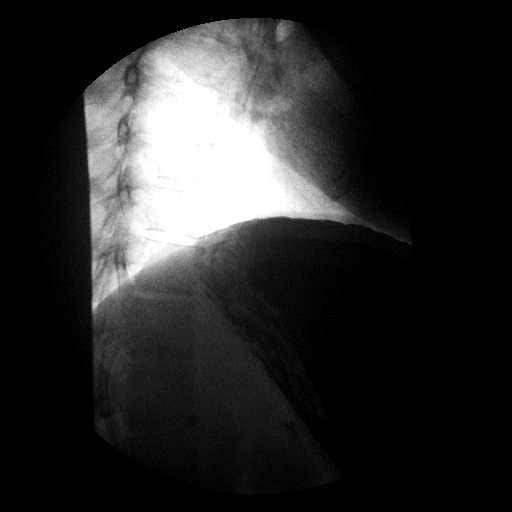

[13 of 24 positions shown; findings below may reference images not displayed]

FINDINGS: Fluoroscopic evaluation of swallowing demonstrates
normal esophageal peristalsis.  No stricture, fold thickening or
mass.  No reflux with the water siphon maneuver.  The patient
swallowed a 13 mm barium tablet which passed into the stomach and
did not reproduce the patient's symptoms.
IMPRESSION: Unremarkable study.

## 2012-07-07 ENCOUNTER — Other Ambulatory Visit: Payer: Self-pay | Admitting: Cardiology

## 2012-08-23 ENCOUNTER — Ambulatory Visit (INDEPENDENT_AMBULATORY_CARE_PROVIDER_SITE_OTHER): Payer: Medicaid Other | Admitting: Obstetrics and Gynecology

## 2012-08-23 ENCOUNTER — Encounter: Payer: Self-pay | Admitting: Obstetrics and Gynecology

## 2012-08-23 VITALS — BP 120/84 | Wt 148.8 lb

## 2012-08-23 DIAGNOSIS — R300 Dysuria: Secondary | ICD-10-CM

## 2012-08-23 DIAGNOSIS — N930 Postcoital and contact bleeding: Secondary | ICD-10-CM

## 2012-08-23 LAB — POCT URINALYSIS DIPSTICK
Nitrite, UA: NEGATIVE
Protein, UA: NEGATIVE

## 2012-08-23 NOTE — Patient Instructions (Signed)
Lubricants prn

## 2012-08-23 NOTE — Progress Notes (Signed)
Subjective:     [name] is a 42 y.o. female here for a exam due to postcoital bleeding. She is status post abdominal hysterectomy with salpingo-oophorectomy.   Gynecologic History No LMP recorded. Patient has had a hysterectomy. Contraception: status post hysterectomy Last Pap: Not require. Results were:  Last mammogram: January 2014. Results were: normal  Obstetric History OB History   Grav Para Term Preterm Abortions TAB SAB Ect Mult Living   3 1 1  2  2         # Outc Date GA Lbr Len/2nd Wgt Sex Del Anes PTL Lv   1 TRM            2 SAB            3 SAB                  Review of Systems Negative except for Crohn's disease which is intermittently flaring of    Objective:    Abdomen: soft, non-tender; bowel sounds normal; no masses,  no organomegaly Pelvic: external genitalia normal, no adnexal masses or tenderness, no cervical motion tenderness, rectovaginal septum normal, uterus surgically absent, vagina normal without discharge and No evidence of lesions, any bleeding sites have healed    Assessment:    Healthy female exam. resolved postocoitao bleeding, susupect abrasion, healed   Plan:    luubricants prn

## 2012-09-07 ENCOUNTER — Telehealth: Payer: Self-pay | Admitting: Obstetrics and Gynecology

## 2012-09-07 NOTE — Telephone Encounter (Signed)
No known medicine for orgasm facilitation, likely being researched .  Stay tuned.

## 2012-09-07 NOTE — Telephone Encounter (Signed)
Pt stated she was in a few weeks ago for bleeding after a hysterectomy. Since the visit pt is having trouble with orgasm is there a medication that she can be given to help with this?

## 2012-09-18 NOTE — Telephone Encounter (Signed)
Pt aware that there is no magic pill per Dr. Glo Herring.

## 2012-09-20 ENCOUNTER — Encounter (INDEPENDENT_AMBULATORY_CARE_PROVIDER_SITE_OTHER): Payer: Self-pay | Admitting: Internal Medicine

## 2012-09-20 ENCOUNTER — Ambulatory Visit (INDEPENDENT_AMBULATORY_CARE_PROVIDER_SITE_OTHER): Payer: Medicaid Other | Admitting: Internal Medicine

## 2012-09-20 VITALS — BP 92/60 | HR 68 | Ht 65.0 in | Wt 147.8 lb

## 2012-09-20 DIAGNOSIS — K5900 Constipation, unspecified: Secondary | ICD-10-CM

## 2012-09-20 NOTE — Patient Instructions (Addendum)
Stool softner. Linzess 168mg # 3 given to patient. OV prn PR in 2 weeks.

## 2012-09-20 NOTE — Progress Notes (Signed)
Subjective:     Patient ID: [name], female   DOB: 06-Mar-1971, 42 y.o.   MRN: [id number]  HPIHere today as a urgent work in. Patient called Dr Laural Golden last night and told him she had not had a BM in 9 days. She was advised to get 2 Fleets and use them. She did not get the Miralax. After the Fleets enema she had a BM. She had a medium amt of BM. No melena or bright red rectal bleeding. She has a hx of consitpation/diarrhea, IBS. If she has diarrhea she will go 14 times a day. She has not had BM today. She usually takes stool softners for her constipation. Appetite is not good. She tells me she has weight loss of 20 pounds which was intentional.  Diagnosed with Crohn's in 1998 at [facility]. She is not currently taking any medication for her Crohn's. She feels better today.  Review of Systems see hpi Past Medical History  Diagnosis Date  . Chest pain     normal stress echo 05/2007  . Palpitation     with tachycardia  . Tobacco abuse     25 yrs pack history 1/2 pack daily  . Hyperglycemia     fasting  . Anxiety   . Chronic low back pain   . GERD (gastroesophageal reflux disease)     EGD 01/2007 by Dr.Rourke small hiatal hernia s/p 14 french maloney   . IBS (irritable bowel syndrome)     chronic constipation h/o colon polyps in 2002 at [facility] per patient   . Closed head injury     memory disturbance  . Crohn's disease   . COPD (chronic obstructive pulmonary disease)    Current Outpatient Prescriptions on File Prior to Visit  Medication Sig Dispense Refill  . ALPRAZolam (XANAX) 1 MG tablet Take 1 mg by mouth 2 (two) times daily.      . methocarbamol (ROBAXIN) 500 MG tablet Take 500 mg by mouth 4 (four) times daily as needed. Muscle spasm      . metoprolol succinate (TOPROL-XL) 100 MG 24 hr tablet TAKE ONE TABLET BY MOUTH DAILY.  30 tablet  6  . omeprazole-sodium bicarbonate (ZEGERID) 40-1100 MG per capsule Take 1 capsule by mouth daily.      . triazolam (HALCION) 0.25 MG tablet  Take 0.25 mg by mouth at bedtime.       No current facility-administered medications on file prior to visit.   Past Surgical History  Procedure Laterality Date  . Appendectomy  2006  . Abdominal hysterectomy      with right salpingo oophorectomy 2005  . Oophorectomy      left for torsion and ovarian fibroma; uterine myoma resected 1995  . Cesarean section      1996  . Colonoscopy  2010    Dr. Gala Romney; negative except for hemorrhoids  . Cholecystectomy     Allergies  Allergen Reactions  . Codeine Shortness Of Breath, Swelling and Rash    Throat swelling  . Penicillins Shortness Of Breath, Swelling and Other (See Comments)    Throat swells   . Benadryl (Diphenhydramine Hcl)   . Omeprazole     REACTION: abdominal cramps  . Strawberry Swelling  . Watermelon Concentrate Swelling  . Latex Rash         Objective:   Physical Exam  Filed Vitals:   09/20/12 1607  BP: 92/60  Pulse: 68  Height: 5' 5"  (1.651 m)  Weight: 147 lb 12.8 oz (67.042  kg)   Alert and oriented. Skin warm and dry. Oral mucosa is moist.   . Sclera anicteric, conjunctivae is pink. Thyroid not enlarged. No cervical lymphadenopathy. Lungs clear. Heart regular rate and rhythm.  Abdomen is soft. Bowel sounds are positive. No hepatomegaly. No abdominal masses felt. No tenderness.  No edema to lower extremities.  No stool in rectum.    Assessment:    Constipation. She did have a BM yesterday. She feels better today. She has a hx of same.    Plan:     Samples of Linzess  119mg# 3 boxes  to try.  PR in 2 weeks.

## 2012-11-13 ENCOUNTER — Telehealth: Payer: Self-pay | Admitting: Physician Assistant

## 2012-11-13 NOTE — Telephone Encounter (Signed)
Patient needs Metoprolol switched to different dose to cover on the $4 plan / tgs

## 2012-11-14 ENCOUNTER — Other Ambulatory Visit: Payer: Self-pay | Admitting: *Deleted

## 2012-11-14 NOTE — Telephone Encounter (Signed)
Advise pt that PCP will take care of refill due to PRN status with this office. Was advised on March 2014  last refill request to have PCP manage BP medicine.

## 2012-11-20 ENCOUNTER — Telehealth: Payer: Self-pay | Admitting: *Deleted

## 2012-11-20 NOTE — Telephone Encounter (Signed)
Patient is out of metoprolol succinate (TOPROL-XL) 100 MG 24 hr tablet needs refill.  She stated for it to be on the $4.00 mark it cannot be extended release.  PCP was seen last month.  Please call the PCP office and advise them of medication management.  Pt states she is out of medication for two day.

## 2012-12-12 NOTE — Telephone Encounter (Signed)
I must have forgot to rout this message from 11/20/12.   Clearly states on last refill she is to contact PCP for this in future.

## 2012-12-12 NOTE — Telephone Encounter (Signed)
Called PCP and request that a message be routed to Dr. Jonni Sanger nurse to call pt. That pt has a question about her metoprolol. Due to patient last appointment being September 2013 and being directed to follow up with [place] as needed.

## 2012-12-26 ENCOUNTER — Emergency Department (HOSPITAL_COMMUNITY)
Admission: EM | Admit: 2012-12-26 | Discharge: 2012-12-26 | Disposition: A | Discharge status: Home / Self Care | Payer: Self-pay | Attending: Emergency Medicine | Admitting: Emergency Medicine

## 2012-12-26 ENCOUNTER — Encounter (HOSPITAL_COMMUNITY): Payer: Self-pay

## 2012-12-26 DIAGNOSIS — J4489 Other specified chronic obstructive pulmonary disease: Secondary | ICD-10-CM | POA: Insufficient documentation

## 2012-12-26 DIAGNOSIS — J449 Chronic obstructive pulmonary disease, unspecified: Secondary | ICD-10-CM | POA: Insufficient documentation

## 2012-12-26 DIAGNOSIS — R1084 Generalized abdominal pain: Secondary | ICD-10-CM | POA: Insufficient documentation

## 2012-12-26 DIAGNOSIS — Z87828 Personal history of other (healed) physical injury and trauma: Secondary | ICD-10-CM | POA: Insufficient documentation

## 2012-12-26 DIAGNOSIS — G8929 Other chronic pain: Secondary | ICD-10-CM | POA: Insufficient documentation

## 2012-12-26 DIAGNOSIS — R1110 Vomiting, unspecified: Secondary | ICD-10-CM | POA: Insufficient documentation

## 2012-12-26 DIAGNOSIS — Z9889 Other specified postprocedural states: Secondary | ICD-10-CM | POA: Insufficient documentation

## 2012-12-26 DIAGNOSIS — F17200 Nicotine dependence, unspecified, uncomplicated: Secondary | ICD-10-CM | POA: Insufficient documentation

## 2012-12-26 DIAGNOSIS — Z91040 Latex allergy status: Secondary | ICD-10-CM | POA: Insufficient documentation

## 2012-12-26 DIAGNOSIS — R197 Diarrhea, unspecified: Secondary | ICD-10-CM | POA: Insufficient documentation

## 2012-12-26 DIAGNOSIS — Z79899 Other long term (current) drug therapy: Secondary | ICD-10-CM | POA: Insufficient documentation

## 2012-12-26 DIAGNOSIS — K219 Gastro-esophageal reflux disease without esophagitis: Secondary | ICD-10-CM | POA: Insufficient documentation

## 2012-12-26 DIAGNOSIS — Z8719 Personal history of other diseases of the digestive system: Secondary | ICD-10-CM | POA: Insufficient documentation

## 2012-12-26 DIAGNOSIS — F411 Generalized anxiety disorder: Secondary | ICD-10-CM | POA: Insufficient documentation

## 2012-12-26 DIAGNOSIS — Z880 Allergy status to penicillin: Secondary | ICD-10-CM | POA: Insufficient documentation

## 2012-12-26 DIAGNOSIS — Z9089 Acquired absence of other organs: Secondary | ICD-10-CM | POA: Insufficient documentation

## 2012-12-26 LAB — CBC WITH DIFFERENTIAL/PLATELET
Basophils Absolute: 0 10*3/uL (ref 0.0–0.1)
Basophils Relative: 0 % (ref 0–1)
MCHC: 35.2 g/dL (ref 30.0–36.0)
Monocytes Absolute: 0.7 10*3/uL (ref 0.1–1.0)
Neutro Abs: 5.5 10*3/uL (ref 1.7–7.7)
Neutrophils Relative %: 63 % (ref 43–77)
Platelets: 323 10*3/uL (ref 150–400)
RDW: 12.6 % (ref 11.5–15.5)

## 2012-12-26 LAB — URINALYSIS, ROUTINE W REFLEX MICROSCOPIC
Glucose, UA: NEGATIVE mg/dL
Ketones, ur: NEGATIVE mg/dL
Leukocytes, UA: NEGATIVE
pH: 6.5 (ref 5.0–8.0)

## 2012-12-26 LAB — COMPREHENSIVE METABOLIC PANEL
AST: 18 U/L (ref 0–37)
Albumin: 4 g/dL (ref 3.5–5.2)
Chloride: 98 mEq/L (ref 96–112)
Creatinine, Ser: 0.84 mg/dL (ref 0.50–1.10)
Potassium: 3.7 mEq/L (ref 3.5–5.1)
Total Bilirubin: 0.5 mg/dL (ref 0.3–1.2)

## 2012-12-26 MED ORDER — GI COCKTAIL ~~LOC~~
30.0000 mL | Freq: Once | ORAL | Status: AC
  Administered 2012-12-26: 30 mL via ORAL
  Filled 2012-12-26: qty 30

## 2012-12-26 MED ORDER — ONDANSETRON HCL 4 MG PO TABS: 4.0000 mg | ORAL_TABLET | Freq: Three times a day (TID) | ORAL | Status: DC | PRN

## 2012-12-26 MED ORDER — SODIUM CHLORIDE 0.9 % IV BOLUS (SEPSIS)
2000.0000 mL | Freq: Once | INTRAVENOUS | Status: AC
  Administered 2012-12-26: 1000 mL via INTRAVENOUS

## 2012-12-26 MED ORDER — ONDANSETRON HCL 4 MG/2ML IJ SOLN
4.0000 mg | Freq: Once | INTRAMUSCULAR | Status: AC
  Administered 2012-12-26: 4 mg via INTRAVENOUS
  Filled 2012-12-26: qty 2

## 2012-12-26 NOTE — ED Notes (Signed)
Pt states she has been vomiting since last Tuesday. States she does not want any pain medication. States she is having abdominal pain but it comes and goes

## 2012-12-26 NOTE — ED Notes (Signed)
Pt given discharge instructions and verbalized understanding. NAD at this time. Vitals are WDL.

## 2012-12-26 NOTE — ED Notes (Signed)
No active vomiting since pt arrived.

## 2012-12-26 NOTE — ED Provider Notes (Signed)
CSN: [id number]     Arrival date & time 12/26/12  1348 History  This chart was scribed for Charles B. Karle Starch, MD,  by Stacy Gardner, ED Scribe. The patient was seen in room APA03/APA03 and the patient's care was started at 3:16 PM   First MD Initiated Contact with Patient 12/26/12 1510     Chief Complaint  Patient presents with  . Emesis   (Consider location/radiation/quality/duration/timing/severity/associated sxs/prior Treatment) Patient is a 42 y.o. female presenting with vomiting. The history is provided by the patient and medical records. No language interpreter was used.  Emesis Severity:  Moderate Duration:  1 week Timing:  Constant Progression:  Unchanged Chronicity:  Recurrent Recent urination:  Normal Associated symptoms: abdominal pain (diffuse) and diarrhea   Associated symptoms: no fever   Diarrhea:    Quality:  Watery   Severity:  Mild   Timing:  Constant   Progression:  Unchanged  HPI Comments: [name] is a 42 y.o. female who presents to the Emergency Department complaining of emesis for a week that is gradually worsening. She also complains of associated symptoms of constant,watery diarrhea and moderate, diffuse, intermittent, abdominal pain. Pt describes the abdominal pain as achy and reports having a similar episode after having a cholesctomy. Pt denies sick contact.  She has a hx of hyperglycemia, GERD, lower chronic back pain, and Crohn's disease.  Past Medical History  Diagnosis Date  . Chest pain     normal stress echo 05/2007  . Palpitation     with tachycardia  . Tobacco abuse     25 yrs pack history 1/2 pack daily  . Hyperglycemia     fasting  . Anxiety   . Chronic low back pain   . GERD (gastroesophageal reflux disease)     EGD 01/2007 by Dr.Rourke small hiatal hernia s/p 41 french maloney   . IBS (irritable bowel syndrome)     chronic constipation h/o colon polyps in 2002 at [facility] per patient   . Closed head injury     memory disturbance   . Crohn's disease   . COPD (chronic obstructive pulmonary disease)    Past Surgical History  Procedure Laterality Date  . Appendectomy  2006  . Abdominal hysterectomy      with right salpingo oophorectomy 2005  . Oophorectomy      left for torsion and ovarian fibroma; uterine myoma resected 1995  . Cesarean section      1996  . Colonoscopy  2010    Dr. Gala Romney; negative except for hemorrhoids  . Cholecystectomy     Family History  Problem Relation Age of Onset  . Cancer Mother   . Heart failure Mother   . Hypertension Mother   . Cancer Father   . Heart failure Father   . Diabetes Father    History  Substance Use Topics  . Smoking status: Current Every Day Smoker -- 0.30 packs/day for 20 years    Types: Cigarettes  . Smokeless tobacco: Never Used  . Alcohol Use: No   OB History   Grav Para Term Preterm Abortions TAB SAB Ect Mult Living   3 1 1  2  2         Review of Systems  Constitutional: Negative for fever.  Gastrointestinal: Positive for vomiting, abdominal pain (diffuse) and diarrhea.  All other systems reviewed and are negative except as noted in HPI.    Allergies  Codeine; Penicillins; Acetaminophen; Benadryl; Dilaudid; Hydrocodone; Morphine and related; Omeprazole; Oxycodone; Strawberry;  Watermelon concentrate; and Latex  Home Medications   Current Outpatient Rx  Name  Route  Sig  Dispense  Refill  . ALPRAZolam (XANAX) 1 MG tablet   Oral   Take 1 mg by mouth 2 (two) times daily.         . cimetidine (TAGAMET) 200 MG tablet   Oral   Take 200 mg by mouth 2 (two) times daily.         . cyclobenzaprine (FLEXERIL) 10 MG tablet   Oral   Take 10 mg by mouth 2 (two) times daily.         . metoprolol succinate (TOPROL-XL) 100 MG 24 hr tablet      TAKE ONE TABLET BY MOUTH DAILY.   30 tablet   6     SEND FUTURE REFILL REQUEST TO PATIENTS PRIMARY CAR ...   . omeprazole-sodium bicarbonate (ZEGERID) 40-1100 MG per capsule   Oral   Take 1 capsule  by mouth daily.         . triazolam (HALCION) 0.25 MG tablet   Oral   Take 0.25 mg by mouth at bedtime.          BP 137/65  Pulse 67  Temp(Src) 97.9 F (36.6 C) (Oral)  Resp 20  Ht 5' 6"  (1.676 m)  Wt 147 lb (66.679 kg)  BMI 23.74 kg/m2  SpO2 98% Physical Exam  Nursing note and vitals reviewed. Constitutional: She is oriented to person, place, and time. She appears well-developed and well-nourished.  HENT:  Head: Normocephalic and atraumatic.  Eyes: EOM are normal. Pupils are equal, round, and reactive to light.  Neck: Normal range of motion. Neck supple.  Cardiovascular: Normal rate, normal heart sounds and intact distal pulses.   Pulmonary/Chest: Effort normal and breath sounds normal.  Abdominal: Bowel sounds are normal. She exhibits no distension. There is tenderness (diffuse). There is no rebound and no guarding.  Musculoskeletal: Normal range of motion. She exhibits no edema and no tenderness.  Neurological: She is alert and oriented to person, place, and time. She has normal strength. No cranial nerve deficit or sensory deficit.  Skin: Skin is warm and dry. No rash noted.  Psychiatric: She has a normal mood and affect.    ED Course  Procedures (including critical care time) DIAGNOSTIC STUDIES: Oxygen Saturation is 98% on room air, normal by my interpretation.    COORDINATION OF CARE: 3:22 PM Discussed course of care with pt . Pt understands and agrees.   Labs Review Labs Reviewed  CBC WITH DIFFERENTIAL - Abnormal; Notable for the following:    Hemoglobin 16.0 (*)    All other components within normal limits  COMPREHENSIVE METABOLIC PANEL - Abnormal; Notable for the following:    Glucose, Bld 105 (*)    GFR calc non Af Amer 84 (*)    All other components within normal limits  LIPASE, BLOOD  URINALYSIS, ROUTINE W REFLEX MICROSCOPIC   Imaging Review No results found.  MDM   1. Vomiting and diarrhea    Labs unremarkable. Abdomen benign, doubt any  significant intraabdominal process. Advised antiemetics, clear liquids and PCP followup.   I personally performed the services described in this documentation, which was scribed in my presence. The recorded information has been reviewed and is accurate.       Charles B. Karle Starch, MD 12/26/12 1740

## 2012-12-26 NOTE — ED Notes (Signed)
Pt stated "I'm having heart burn."

## 2013-02-07 ENCOUNTER — Emergency Department (HOSPITAL_COMMUNITY)
Admission: EM | Admit: 2013-02-07 | Discharge: 2013-02-07 | Disposition: A | Discharge status: Home / Self Care | Payer: Medicaid Other | Attending: Emergency Medicine | Admitting: Emergency Medicine

## 2013-02-07 ENCOUNTER — Encounter (HOSPITAL_COMMUNITY): Payer: Self-pay | Admitting: Emergency Medicine

## 2013-02-07 ENCOUNTER — Emergency Department (HOSPITAL_COMMUNITY): Payer: Medicaid Other

## 2013-02-07 DIAGNOSIS — J159 Unspecified bacterial pneumonia: Secondary | ICD-10-CM | POA: Insufficient documentation

## 2013-02-07 DIAGNOSIS — K219 Gastro-esophageal reflux disease without esophagitis: Secondary | ICD-10-CM | POA: Insufficient documentation

## 2013-02-07 DIAGNOSIS — Z79899 Other long term (current) drug therapy: Secondary | ICD-10-CM | POA: Insufficient documentation

## 2013-02-07 DIAGNOSIS — F17200 Nicotine dependence, unspecified, uncomplicated: Secondary | ICD-10-CM | POA: Insufficient documentation

## 2013-02-07 DIAGNOSIS — IMO0002 Reserved for concepts with insufficient information to code with codable children: Secondary | ICD-10-CM | POA: Insufficient documentation

## 2013-02-07 DIAGNOSIS — Z8639 Personal history of other endocrine, nutritional and metabolic disease: Secondary | ICD-10-CM | POA: Insufficient documentation

## 2013-02-07 DIAGNOSIS — Z880 Allergy status to penicillin: Secondary | ICD-10-CM | POA: Insufficient documentation

## 2013-02-07 DIAGNOSIS — J441 Chronic obstructive pulmonary disease with (acute) exacerbation: Secondary | ICD-10-CM | POA: Insufficient documentation

## 2013-02-07 DIAGNOSIS — F411 Generalized anxiety disorder: Secondary | ICD-10-CM | POA: Insufficient documentation

## 2013-02-07 DIAGNOSIS — J189 Pneumonia, unspecified organism: Secondary | ICD-10-CM

## 2013-02-07 DIAGNOSIS — Z792 Long term (current) use of antibiotics: Secondary | ICD-10-CM | POA: Insufficient documentation

## 2013-02-07 DIAGNOSIS — Z862 Personal history of diseases of the blood and blood-forming organs and certain disorders involving the immune mechanism: Secondary | ICD-10-CM | POA: Insufficient documentation

## 2013-02-07 DIAGNOSIS — G8929 Other chronic pain: Secondary | ICD-10-CM | POA: Insufficient documentation

## 2013-02-07 DIAGNOSIS — Z91040 Latex allergy status: Secondary | ICD-10-CM | POA: Insufficient documentation

## 2013-02-07 LAB — COMPREHENSIVE METABOLIC PANEL
Alkaline Phosphatase: 101 U/L (ref 39–117)
BUN: 10 mg/dL (ref 6–23)
Chloride: 99 mEq/L (ref 96–112)
GFR calc Af Amer: 73 mL/min — ABNORMAL LOW (ref >90)
GFR calc non Af Amer: 63 mL/min — ABNORMAL LOW (ref >90)
Glucose, Bld: 98 mg/dL (ref 70–99)
Potassium: 3.5 mEq/L (ref 3.5–5.1)
Total Bilirubin: 0.3 mg/dL (ref 0.3–1.2)

## 2013-02-07 LAB — CBC WITH DIFFERENTIAL/PLATELET
HCT: 39.1 % (ref 36.0–46.0)
Hemoglobin: 13.4 g/dL (ref 12.0–15.0)
Lymphs Abs: 1 10*3/uL (ref 0.7–4.0)
MCH: 31.8 pg (ref 26.0–34.0)
Monocytes Relative: 6 % (ref 3–12)
Neutro Abs: 10.2 10*3/uL — ABNORMAL HIGH (ref 1.7–7.7)
Neutrophils Relative %: 86 % — ABNORMAL HIGH (ref 43–77)
RBC: 4.22 MIL/uL (ref 3.87–5.11)

## 2013-02-07 MED ORDER — LEVALBUTEROL HCL 1.25 MG/0.5ML IN NEBU
1.2500 mg | INHALATION_SOLUTION | Freq: Once | RESPIRATORY_TRACT | Status: AC
  Administered 2013-02-07: 1.25 mg via RESPIRATORY_TRACT
  Filled 2013-02-07: qty 0.5

## 2013-02-07 MED ORDER — LEVOFLOXACIN 750 MG PO TABS
750.0000 mg | ORAL_TABLET | Freq: Once | ORAL | Status: AC
  Administered 2013-02-07: 750 mg via ORAL
  Filled 2013-02-07: qty 1

## 2013-02-07 MED ORDER — ALBUTEROL SULFATE HFA 108 (90 BASE) MCG/ACT IN AERS: 1.0000 | INHALATION_SPRAY | Freq: Four times a day (QID) | RESPIRATORY_TRACT | Status: DC | PRN

## 2013-02-07 MED ORDER — MOXIFLOXACIN HCL 400 MG PO TABS: 400.0000 mg | ORAL_TABLET | Freq: Every day | ORAL | Status: DC

## 2013-02-07 MED ORDER — PREDNISONE 10 MG PO TABS: 20.0000 mg | ORAL_TABLET | Freq: Every day | ORAL | Status: DC

## 2013-02-07 MED ORDER — PREDNISONE 50 MG PO TABS
60.0000 mg | ORAL_TABLET | Freq: Once | ORAL | Status: AC
  Administered 2013-02-07: 60 mg via ORAL
  Filled 2013-02-07 (×2): qty 1

## 2013-02-07 NOTE — ED Notes (Signed)
Pt co cough and chest congestion x3 days

## 2013-02-07 NOTE — ED Notes (Signed)
Pt called stating she could not afford abx rx, requesting cheaper Rx.  Per Dr. Lacinda Axon - Rx for Levaquin 514m Daily, 10 ct,  [facility]  Pt notified.

## 2013-02-07 NOTE — ED Notes (Signed)
Pt reports emesis since last night. Vomiting in room this am. Report given to America Brown, RN.

## 2013-02-07 NOTE — ED Provider Notes (Signed)
CSN: [id number]     Arrival date & time 02/07/13  1140 History   First MD Initiated Contact with Patient 02/07/13 1225     Chief Complaint  Patient presents with  . Cough  . Shortness of Breath   (Consider location/radiation/quality/duration/timing/severity/associated sxs/prior Treatment) Patient is a 42 y.o. female presenting with cough and shortness of breath. The history is provided by the patient (pt complains of cough and weakness).  Cough Cough characteristics:  Productive Sputum characteristics:  Green Severity:  Moderate Onset quality:  Gradual Timing:  Constant Progression:  Unchanged Chronicity:  New Context: not animal exposure   Associated symptoms: shortness of breath   Associated symptoms: no chest pain, no eye discharge, no headaches and no rash   Shortness of Breath Associated symptoms: cough   Associated symptoms: no abdominal pain, no chest pain, no headaches and no rash     Past Medical History  Diagnosis Date  . Chest pain     normal stress echo 05/2007  . Palpitation     with tachycardia  . Tobacco abuse     25 yrs pack history 1/2 pack daily  . Hyperglycemia     fasting  . Anxiety   . Chronic low back pain   . GERD (gastroesophageal reflux disease)     EGD 01/2007 by Dr.Rourke small hiatal hernia s/p 79 french maloney   . IBS (irritable bowel syndrome)     chronic constipation h/o colon polyps in 2002 at [facility] per patient   . Closed head injury     memory disturbance  . Crohn's disease   . COPD (chronic obstructive pulmonary disease)    Past Surgical History  Procedure Laterality Date  . Appendectomy  2006  . Abdominal hysterectomy      with right salpingo oophorectomy 2005  . Oophorectomy      left for torsion and ovarian fibroma; uterine myoma resected 1995  . Cesarean section      1996  . Colonoscopy  2010    Dr. Gala Romney; negative except for hemorrhoids  . Cholecystectomy     Family History  Problem Relation Age of Onset  . Cancer  Mother   . Heart failure Mother   . Hypertension Mother   . Cancer Father   . Heart failure Father   . Diabetes Father    History  Substance Use Topics  . Smoking status: Current Every Day Smoker -- 0.30 packs/day for 20 years    Types: Cigarettes  . Smokeless tobacco: Never Used  . Alcohol Use: No   OB History   Grav Para Term Preterm Abortions TAB SAB Ect Mult Living   3 1 1  2  2         Review of Systems  Constitutional: Negative for appetite change and fatigue.  HENT: Negative for congestion, ear discharge and sinus pressure.   Eyes: Negative for discharge.  Respiratory: Positive for cough and shortness of breath.   Cardiovascular: Negative for chest pain.  Gastrointestinal: Negative for abdominal pain and diarrhea.  Genitourinary: Negative for frequency and hematuria.  Musculoskeletal: Negative for back pain.  Skin: Negative for rash.  Neurological: Negative for seizures and headaches.  Psychiatric/Behavioral: Negative for hallucinations.    Allergies  Codeine; Penicillins; Acetaminophen; Benadryl; Dilaudid; Hydrocodone; Morphine and related; Omeprazole; Oxycodone; Strawberry; Watermelon concentrate; and Latex  Home Medications   Current Outpatient Rx  Name  Route  Sig  Dispense  Refill  . ALPRAZolam (XANAX) 1 MG tablet   Oral  Take 1 mg by mouth 2 (two) times daily.         . cyclobenzaprine (FLEXERIL) 10 MG tablet   Oral   Take 10 mg by mouth 2 (two) times daily.         . metoprolol succinate (TOPROL-XL) 100 MG 24 hr tablet   Oral   Take 100 mg by mouth 2 (two) times daily. Take with or immediately following a meal.         . omeprazole-sodium bicarbonate (ZEGERID) 40-1100 MG per capsule   Oral   Take 1 capsule by mouth daily.         . ondansetron (ZOFRAN) 4 MG tablet   Oral   Take 1 tablet (4 mg total) by mouth every 8 (eight) hours as needed for nausea.   12 tablet   0   . triazolam (HALCION) 0.25 MG tablet   Oral   Take 0.25 mg by  mouth at bedtime.         Marland Kitchen albuterol (PROVENTIL HFA;VENTOLIN HFA) 108 (90 BASE) MCG/ACT inhaler   Inhalation   Inhale 1-2 puffs into the lungs every 6 (six) hours as needed for wheezing.   1 Inhaler   0   . moxifloxacin (AVELOX) 400 MG tablet   Oral   Take 1 tablet (400 mg total) by mouth daily.   10 tablet   0   . predniSONE (DELTASONE) 10 MG tablet   Oral   Take 2 tablets (20 mg total) by mouth daily.   14 tablet   0    BP 101/74  Pulse 88  Temp(Src) 99.3 F (37.4 C) (Oral)  Resp 22  Ht 5' 6"  (1.676 m)  Wt 147 lb (66.679 kg)  BMI 23.74 kg/m2  SpO2 97% Physical Exam  Constitutional: She is oriented to person, place, and time. She appears well-developed.  HENT:  Head: Normocephalic.  Eyes: Conjunctivae and EOM are normal. No scleral icterus.  Neck: Neck supple. No thyromegaly present.  Cardiovascular: Normal rate and regular rhythm.  Exam reveals no gallop and no friction rub.   No murmur heard. Pulmonary/Chest: No stridor. She has wheezes. She has no rales. She exhibits no tenderness.  Mild wheezes  Abdominal: She exhibits no distension. There is no tenderness. There is no rebound.  Musculoskeletal: Normal range of motion. She exhibits no edema.  Lymphadenopathy:    She has no cervical adenopathy.  Neurological: She is oriented to person, place, and time. She exhibits normal muscle tone. Coordination normal.  Skin: No rash noted. No erythema.  Psychiatric: She has a normal mood and affect. Her behavior is normal.    ED Course  Procedures (including critical care time) Labs Review Labs Reviewed  CBC WITH DIFFERENTIAL - Abnormal; Notable for the following:    WBC 11.9 (*)    Neutrophils Relative % 86 (*)    Neutro Abs 10.2 (*)    Lymphocytes Relative 8 (*)    All other components within normal limits  COMPREHENSIVE METABOLIC PANEL - Abnormal; Notable for the following:    GFR calc non Af Amer 63 (*)    GFR calc Af Amer 73 (*)    All other components  within normal limits   Imaging Review Dg Chest 2 View  02/07/2013   CLINICAL DATA:  Cough, shortness of breath  EXAM: CHEST  2 VIEW  COMPARISON:  04/23/2012  FINDINGS: Mild patchy right middle and bilateral lower lobe opacities, suspicious for pneumonia.  The heart is normal in  size.  Visualized osseous structures are within normal limits.  IMPRESSION: Mild patchy right middle and bilateral lower lobe opacities, suspicious for pneumonia.   Electronically Signed   By: Julian Hy M.D.   On: 02/07/2013 12:25    MDM   1. Community acquired pneumonia      Pt to follow up with pcp  Maudry Diego, MD 02/07/13 1410

## 2013-02-07 NOTE — ED Notes (Signed)
Pt vomiting in room.

## 2013-05-24 ENCOUNTER — Encounter: Payer: Self-pay | Admitting: Internal Medicine

## 2013-06-04 ENCOUNTER — Other Ambulatory Visit (HOSPITAL_COMMUNITY): Payer: Self-pay | Admitting: Family Medicine

## 2013-06-04 DIAGNOSIS — Z139 Encounter for screening, unspecified: Secondary | ICD-10-CM

## 2013-06-15 ENCOUNTER — Ambulatory Visit (HOSPITAL_COMMUNITY): Payer: Medicaid Other

## 2013-06-19 ENCOUNTER — Ambulatory Visit (HOSPITAL_COMMUNITY): Payer: Medicaid Other

## 2013-12-26 ENCOUNTER — Encounter (HOSPITAL_COMMUNITY): Payer: Self-pay | Admitting: Emergency Medicine

## 2013-12-26 ENCOUNTER — Emergency Department (HOSPITAL_COMMUNITY)
Admission: EM | Admit: 2013-12-26 | Discharge: 2013-12-26 | Disposition: A | Discharge status: Home / Self Care | Payer: Medicaid Other | Attending: Emergency Medicine | Admitting: Emergency Medicine

## 2013-12-26 DIAGNOSIS — F411 Generalized anxiety disorder: Secondary | ICD-10-CM | POA: Insufficient documentation

## 2013-12-26 DIAGNOSIS — Z87828 Personal history of other (healed) physical injury and trauma: Secondary | ICD-10-CM | POA: Insufficient documentation

## 2013-12-26 DIAGNOSIS — F17200 Nicotine dependence, unspecified, uncomplicated: Secondary | ICD-10-CM | POA: Insufficient documentation

## 2013-12-26 DIAGNOSIS — Z90710 Acquired absence of both cervix and uterus: Secondary | ICD-10-CM | POA: Insufficient documentation

## 2013-12-26 DIAGNOSIS — Z91040 Latex allergy status: Secondary | ICD-10-CM | POA: Insufficient documentation

## 2013-12-26 DIAGNOSIS — Z880 Allergy status to penicillin: Secondary | ICD-10-CM | POA: Insufficient documentation

## 2013-12-26 DIAGNOSIS — R112 Nausea with vomiting, unspecified: Secondary | ICD-10-CM | POA: Insufficient documentation

## 2013-12-26 DIAGNOSIS — Z79899 Other long term (current) drug therapy: Secondary | ICD-10-CM | POA: Insufficient documentation

## 2013-12-26 DIAGNOSIS — L089 Local infection of the skin and subcutaneous tissue, unspecified: Secondary | ICD-10-CM

## 2013-12-26 DIAGNOSIS — R109 Unspecified abdominal pain: Secondary | ICD-10-CM | POA: Insufficient documentation

## 2013-12-26 DIAGNOSIS — Z9089 Acquired absence of other organs: Secondary | ICD-10-CM | POA: Insufficient documentation

## 2013-12-26 DIAGNOSIS — K219 Gastro-esophageal reflux disease without esophagitis: Secondary | ICD-10-CM | POA: Insufficient documentation

## 2013-12-26 DIAGNOSIS — R197 Diarrhea, unspecified: Secondary | ICD-10-CM | POA: Insufficient documentation

## 2013-12-26 DIAGNOSIS — J4489 Other specified chronic obstructive pulmonary disease: Secondary | ICD-10-CM | POA: Insufficient documentation

## 2013-12-26 DIAGNOSIS — L0293 Carbuncle, unspecified: Secondary | ICD-10-CM

## 2013-12-26 DIAGNOSIS — J449 Chronic obstructive pulmonary disease, unspecified: Secondary | ICD-10-CM | POA: Insufficient documentation

## 2013-12-26 DIAGNOSIS — G8929 Other chronic pain: Secondary | ICD-10-CM | POA: Insufficient documentation

## 2013-12-26 DIAGNOSIS — N764 Abscess of vulva: Secondary | ICD-10-CM | POA: Insufficient documentation

## 2013-12-26 LAB — CBC WITH DIFFERENTIAL/PLATELET
Basophils Absolute: 0 10*3/uL (ref 0.0–0.1)
Basophils Relative: 0 % (ref 0–1)
EOS ABS: 0.2 10*3/uL (ref 0.0–0.7)
EOS PCT: 2 % (ref 0–5)
HCT: 46.1 % — ABNORMAL HIGH (ref 36.0–46.0)
HEMOGLOBIN: 16.3 g/dL — AB (ref 12.0–15.0)
LYMPHS ABS: 2.3 10*3/uL (ref 0.7–4.0)
LYMPHS PCT: 21 % (ref 12–46)
MCH: 31.8 pg (ref 26.0–34.0)
MCHC: 35.4 g/dL (ref 30.0–36.0)
MCV: 89.9 fL (ref 78.0–100.0)
MONOS PCT: 7 % (ref 3–12)
Monocytes Absolute: 0.8 10*3/uL (ref 0.1–1.0)
NEUTROS PCT: 70 % (ref 43–77)
Neutro Abs: 7.7 10*3/uL (ref 1.7–7.7)
PLATELETS: 341 10*3/uL (ref 150–400)
RBC: 5.13 MIL/uL — AB (ref 3.87–5.11)
RDW: 12.7 % (ref 11.5–15.5)
WBC: 11 10*3/uL — AB (ref 4.0–10.5)

## 2013-12-26 LAB — BASIC METABOLIC PANEL
Anion gap: 14 (ref 5–15)
BUN: 12 mg/dL (ref 6–23)
CO2: 29 meq/L (ref 19–32)
Calcium: 10.1 mg/dL (ref 8.4–10.5)
Chloride: 96 mEq/L (ref 96–112)
Creatinine, Ser: 0.93 mg/dL (ref 0.50–1.10)
GFR calc Af Amer: 85 mL/min — ABNORMAL LOW (ref >90)
GFR calc non Af Amer: 74 mL/min — ABNORMAL LOW (ref >90)
GLUCOSE: 112 mg/dL — AB (ref 70–99)
POTASSIUM: 4 meq/L (ref 3.7–5.3)
SODIUM: 139 meq/L (ref 137–147)

## 2013-12-26 LAB — SEDIMENTATION RATE: SED RATE: 18 mm/h (ref 0–22)

## 2013-12-26 LAB — URINALYSIS, ROUTINE W REFLEX MICROSCOPIC
BILIRUBIN URINE: NEGATIVE
GLUCOSE, UA: NEGATIVE mg/dL
Ketones, ur: NEGATIVE mg/dL
Nitrite: POSITIVE — AB
PH: 5.5 (ref 5.0–8.0)
Protein, ur: 30 mg/dL — AB
SPECIFIC GRAVITY, URINE: 1.02 (ref 1.005–1.030)
UROBILINOGEN UA: 0.2 mg/dL (ref 0.0–1.0)

## 2013-12-26 LAB — URINE MICROSCOPIC-ADD ON

## 2013-12-26 MED ORDER — ONDANSETRON 4 MG PO TBDP
4.0000 mg | ORAL_TABLET | Freq: Once | ORAL | Status: AC
  Administered 2013-12-26: 4 mg via ORAL
  Filled 2013-12-26: qty 1

## 2013-12-26 MED ORDER — ONDANSETRON HCL 4 MG/2ML IJ SOLN
4.0000 mg | Freq: Once | INTRAMUSCULAR | Status: DC
  Filled 2013-12-26: qty 2

## 2013-12-26 MED ORDER — SODIUM CHLORIDE 0.9 % IV BOLUS (SEPSIS): 1000.0000 mL | Freq: Once | INTRAVENOUS | Status: DC

## 2013-12-26 MED ORDER — GI COCKTAIL ~~LOC~~
30.0000 mL | Freq: Once | ORAL | Status: AC
  Administered 2013-12-26: 30 mL via ORAL
  Filled 2013-12-26: qty 30

## 2013-12-26 MED ORDER — FAMOTIDINE IN NACL 20-0.9 MG/50ML-% IV SOLN
20.0000 mg | Freq: Once | INTRAVENOUS | Status: DC
  Filled 2013-12-26: qty 50

## 2013-12-26 MED ORDER — OMEPRAZOLE-SODIUM BICARBONATE 40-1100 MG PO CAPS: 1.0000 | ORAL_CAPSULE | Freq: Every day | ORAL | Status: DC

## 2013-12-26 MED ORDER — ONDANSETRON 4 MG PO TBDP: 4.0000 mg | ORAL_TABLET | Freq: Three times a day (TID) | ORAL | Status: DC | PRN

## 2013-12-26 MED ORDER — SULFAMETHOXAZOLE-TRIMETHOPRIM 800-160 MG PO TABS: 2.0000 | ORAL_TABLET | Freq: Two times a day (BID) | ORAL | Status: DC

## 2013-12-26 NOTE — ED Provider Notes (Signed)
CSN: [id number]     Arrival date & time 12/26/13  1307 History   First MD Initiated Contact with Patient 12/26/13 1520     Chief Complaint  Patient presents with  . Abdominal Pain      HPI  Patient presents with multiple complaints. First is that she has an area on her right labia, and over her pubic hair where she feels like she has an ingrown hair or infection. Similar one on her leg. She felt initially may have been a bite. Second complaint is that she's had nausea and a few episodes of vomiting over each of the last 3-4 days. No significant abdominal pain. No other stools. Some loose stool. Heme-negative nonbilious emesis. History of Crohn's disease. Not currently medicated. Does take Zegerid for reflux.  Past Medical History  Diagnosis Date  . Chest pain     normal stress echo 05/2007  . Palpitation     with tachycardia  . Tobacco abuse     25 yrs pack history 1/2 pack daily  . Hyperglycemia     fasting  . Anxiety   . Chronic low back pain   . GERD (gastroesophageal reflux disease)     EGD 01/2007 by Dr.Rourke small hiatal hernia s/p 36 french maloney   . IBS (irritable bowel syndrome)     chronic constipation h/o colon polyps in 2002 at [facility] per patient   . Closed head injury     memory disturbance  . Crohn's disease   . COPD (chronic obstructive pulmonary disease)    Past Surgical History  Procedure Laterality Date  . Appendectomy  2006  . Abdominal hysterectomy      with right salpingo oophorectomy 2005  . Oophorectomy      left for torsion and ovarian fibroma; uterine myoma resected 1995  . Cesarean section      1996  . Colonoscopy  2010    Dr. Gala Romney; negative except for hemorrhoids  . Cholecystectomy     Family History  Problem Relation Age of Onset  . Cancer Mother   . Heart failure Mother   . Hypertension Mother   . Cancer Father   . Heart failure Father   . Diabetes Father    History  Substance Use Topics  . Smoking status: Current Every Day Smoker  -- 0.30 packs/day for 20 years    Types: Cigarettes  . Smokeless tobacco: Never Used  . Alcohol Use: No   OB History   Grav Para Term Preterm Abortions TAB SAB Ect Mult Living   3 1 1  2  2         Review of Systems  Constitutional: Negative for fever, chills, diaphoresis, appetite change and fatigue.  HENT: Negative for mouth sores, sore throat and trouble swallowing.   Eyes: Negative for visual disturbance.  Respiratory: Negative for cough, chest tightness, shortness of breath and wheezing.   Cardiovascular: Negative for chest pain.  Gastrointestinal: Positive for nausea, vomiting and diarrhea. Negative for abdominal pain and abdominal distention.  Endocrine: Negative for polydipsia, polyphagia and polyuria.  Genitourinary: Negative for dysuria, frequency and hematuria.  Musculoskeletal: Negative for gait problem.  Skin: Positive for wound. Negative for color change, pallor and rash.  Neurological: Negative for dizziness, syncope, light-headedness and headaches.  Hematological: Does not bruise/bleed easily.  Psychiatric/Behavioral: Negative for behavioral problems and confusion.      Allergies  Codeine; Penicillins; Acetaminophen; Benadryl; Dilaudid; Hydrocodone; Morphine and related; Omeprazole; Oxycodone; Strawberry; Watermelon concentrate; and Latex  Home  Medications   Prior to Admission medications   Medication Sig Start Date End Date Taking? Authorizing Provider  ALPRAZolam [name]) 1 MG tablet Take 1 mg by mouth 2 (two) times daily.   Yes Historical Provider, MD  cyclobenzaprine (FLEXERIL) 10 MG tablet Take 10 mg by mouth 2 (two) times daily.   Yes Historical Provider, MD  metoprolol succinate (TOPROL-XL) 100 MG 24 hr tablet Take 100 mg by mouth daily. Take with or immediately following a meal.   Yes Historical Provider, MD  omeprazole-sodium bicarbonate (ZEGERID) 40-1100 MG per capsule Take 1 capsule by mouth daily.   Yes Historical Provider, MD  triazolam (HALCION) 0.25  MG tablet Take 0.25 mg by mouth at bedtime.   Yes Historical Provider, MD  omeprazole-sodium bicarbonate (ZEGERID) 40-1100 MG per capsule Take 1 capsule by mouth daily before breakfast. 12/26/13   Tanna Furry, MD  ondansetron (ZOFRAN ODT) 4 MG disintegrating tablet Take 1 tablet (4 mg total) by mouth every 8 (eight) hours as needed for nausea. 12/26/13   Tanna Furry, MD  sulfamethoxazole-trimethoprim (SEPTRA DS) 800-160 MG per tablet Take 2 tablets by mouth 2 (two) times daily. 12/26/13   Tanna Furry, MD   BP 131/79  Pulse 66  Temp(Src) 97.7 F (36.5 C) (Oral)  Resp 16  Ht 5' 6"  (1.676 m)  Wt 151 lb (68.493 kg)  BMI 24.38 kg/m2  SpO2 99% Physical Exam  Constitutional: She is oriented to person, place, and time. She appears well-developed and well-nourished. No distress.  HENT:  Head: Normocephalic.  Eyes: Conjunctivae are normal. Pupils are equal, round, and reactive to light. No scleral icterus.  Neck: Normal range of motion. Neck supple. No thyromegaly present.  Cardiovascular: Normal rate and regular rhythm.  Exam reveals no gallop and no friction rub.   No murmur heard. Pulmonary/Chest: Effort normal and breath sounds normal. No respiratory distress. She has no wheezes. She has no rales.  Abdominal: Soft. Bowel sounds are normal. She exhibits no distension. There is no tenderness. There is no rebound.    Genitourinary:     Musculoskeletal: Normal range of motion.  Neurological: She is alert and oriented to person, place, and time.  Skin: Skin is warm and dry. No rash noted.  Psychiatric: She has a normal mood and affect. Her behavior is normal.    ED Course  Procedures (including critical care time) Labs Review Labs Reviewed  URINALYSIS, ROUTINE W REFLEX MICROSCOPIC - Abnormal; Notable for the following:    Hgb urine dipstick MODERATE (*)    Protein, ur 30 (*)    Nitrite POSITIVE (*)    Leukocytes, UA TRACE (*)    All other components within normal limits  CBC WITH  DIFFERENTIAL - Abnormal; Notable for the following:    WBC 11.0 (*)    RBC 5.13 (*)    Hemoglobin 16.3 (*)    HCT 46.1 (*)    All other components within normal limits  BASIC METABOLIC PANEL - Abnormal; Notable for the following:    Glucose, Bld 112 (*)    GFR calc non Af Amer 74 (*)    GFR calc Af Amer 85 (*)    All other components within normal limits  URINE MICROSCOPIC-ADD ON - Abnormal; Notable for the following:    Squamous Epithelial / LPF MANY (*)    Bacteria, UA MANY (*)    All other components within normal limits  SEDIMENTATION RATE    Imaging Review No results found.   EKG Interpretation None  MDM   Final diagnoses:  Non-intractable vomiting with nausea, vomiting of unspecified type  Skin infection  Carbuncle    Unable to get IV. However patient able to take by mouth liquids after Zofran. Urine appears infected. However, she is asymptomatic, other than lower abdominal pain. No dysuria or irritable voiding symptoms. No hematuria. Culture requested. Exam shows superficial skin infections on the right lower labia, mons pubis, and right thigh. No palpable abscess. Plan is MRSA Dosepak from. Refill on her Zegerid, primary care followup. Normal sedimentation rate. Doubt Crohn's flare.    Tanna Furry, MD 12/26/13 [phone]

## 2013-12-26 NOTE — ED Notes (Signed)
PT c/o lower abdominal pain x4 days with n/v/d. PT also c/o abscess to right groin.

## 2013-12-26 NOTE — Discharge Instructions (Signed)
Apply warm compresses to the areas of infection in your leg/groin. Recheck in ER if any of these areas get larger.   Nausea, Adult Nausea is the feeling that you have an upset stomach or have to vomit. Nausea by itself is not likely a serious concern, but it may be an early sign of more serious medical problems. As nausea gets worse, it can lead to vomiting. If vomiting develops, there is the risk of dehydration.  CAUSES   Viral infections.  Food poisoning.  Medicines.  Pregnancy.  Motion sickness.  Migraine headaches.  Emotional distress.  Severe pain from any source.  Alcohol intoxication. HOME CARE INSTRUCTIONS  Get plenty of rest.  Ask your caregiver about specific rehydration instructions.  Eat small amounts of food and sip liquids more often.  Take all medicines as told by your caregiver. SEEK MEDICAL CARE IF:  You have not improved after 2 days, or you get worse.  You have a headache. SEEK IMMEDIATE MEDICAL CARE IF:   You have a fever.  You faint.  You keep vomiting or have blood in your vomit.  You are extremely weak or dehydrated.  You have dark or bloody stools.  You have severe chest or abdominal pain. MAKE SURE YOU:  Understand these instructions.  Will watch your condition.  Will get help right away if you are not doing well or get worse. Document Released: 05/27/2004 Document Revised: 01/12/2012 Document Reviewed: 12/30/2010 [facility] Patient Information 2015 [place], [place]. This information is not intended to replace advice given to you by your health care provider. Make sure you discuss any questions you have with your health care provider.

## 2014-01-14 ENCOUNTER — Ambulatory Visit (HOSPITAL_COMMUNITY)
Admission: RE | Admit: 2014-01-14 | Discharge: 2014-01-14 | Disposition: A | Discharge status: Home / Self Care | Payer: Medicaid Other | Source: Ambulatory Visit | Attending: Nurse Practitioner | Admitting: Nurse Practitioner

## 2014-01-14 ENCOUNTER — Other Ambulatory Visit (HOSPITAL_COMMUNITY): Payer: Self-pay | Admitting: Nurse Practitioner

## 2014-01-14 DIAGNOSIS — M549 Dorsalgia, unspecified: Secondary | ICD-10-CM

## 2014-01-14 DIAGNOSIS — M546 Pain in thoracic spine: Secondary | ICD-10-CM | POA: Insufficient documentation

## 2014-01-14 DIAGNOSIS — M545 Low back pain, unspecified: Secondary | ICD-10-CM | POA: Insufficient documentation

## 2014-01-14 DIAGNOSIS — M542 Cervicalgia: Secondary | ICD-10-CM | POA: Insufficient documentation

## 2014-01-14 DIAGNOSIS — M79609 Pain in unspecified limb: Secondary | ICD-10-CM | POA: Insufficient documentation

## 2014-03-04 ENCOUNTER — Encounter (HOSPITAL_COMMUNITY): Payer: Self-pay | Admitting: Emergency Medicine

## 2014-06-06 ENCOUNTER — Other Ambulatory Visit (HOSPITAL_COMMUNITY): Payer: Self-pay | Admitting: Radiology

## 2014-06-06 DIAGNOSIS — J441 Chronic obstructive pulmonary disease with (acute) exacerbation: Secondary | ICD-10-CM

## 2014-06-12 ENCOUNTER — Ambulatory Visit (HOSPITAL_COMMUNITY): Admission: RE | Admit: 2014-06-12 | Payer: Medicaid Other | Source: Ambulatory Visit

## 2014-06-19 ENCOUNTER — Ambulatory Visit (HOSPITAL_COMMUNITY)
Admission: RE | Admit: 2014-06-19 | Discharge: 2014-06-19 | Disposition: A | Discharge status: Home / Self Care | Payer: BLUE CROSS/BLUE SHIELD | Source: Ambulatory Visit | Attending: Internal Medicine | Admitting: Internal Medicine

## 2014-06-19 DIAGNOSIS — J441 Chronic obstructive pulmonary disease with (acute) exacerbation: Secondary | ICD-10-CM | POA: Diagnosis present

## 2014-06-19 MED ORDER — ALBUTEROL SULFATE (2.5 MG/3ML) 0.083% IN NEBU
2.5000 mg | INHALATION_SOLUTION | Freq: Once | RESPIRATORY_TRACT | Status: AC
  Administered 2014-06-19: 2.5 mg via RESPIRATORY_TRACT

## 2014-06-22 LAB — PULMONARY FUNCTION TEST
DL/VA % pred: 84 %
DL/VA: 4.28 ml/min/mmHg/L
DLCO UNC: 13.2 ml/min/mmHg
DLCO cor % pred: 48 %
DLCO cor: 13.2 ml/min/mmHg
DLCO unc % pred: 48 %
FEF 25-75 POST: 3.01 L/s
FEF 25-75 Pre: 2.86 L/sec
FEF2575-%Change-Post: 5 %
FEF2575-%PRED-POST: 98 %
FEF2575-%Pred-Pre: 93 %
FEV1-%Change-Post: 0 %
FEV1-%PRED-POST: 78 %
FEV1-%PRED-PRE: 78 %
FEV1-POST: 2.44 L
FEV1-Pre: 2.43 L
FEV1FVC-%CHANGE-POST: 5 %
FEV1FVC-%Pred-Pre: 104 %
FEV6-%CHANGE-POST: -4 %
FEV6-%Pred-Post: 72 %
FEV6-%Pred-Pre: 76 %
FEV6-PRE: 2.89 L
FEV6-Post: 2.75 L
FEV6FVC-%Pred-Post: 102 %
FEV6FVC-%Pred-Pre: 102 %
FVC-%Change-Post: -4 %
FVC-%PRED-POST: 71 %
FVC-%Pred-Pre: 74 %
FVC-Post: 2.75 L
FVC-Pre: 2.89 L
POST FEV1/FVC RATIO: 88 %
POST FEV6/FVC RATIO: 100 %
PRE FEV1/FVC RATIO: 84 %
Pre FEV6/FVC Ratio: 100 %
RV % PRED: 118 %
RV: 2.13 L
TLC % PRED: 82 %
TLC: 4.43 L

## 2014-08-07 ENCOUNTER — Encounter: Payer: Self-pay | Admitting: *Deleted

## 2014-08-07 ENCOUNTER — Ambulatory Visit (INDEPENDENT_AMBULATORY_CARE_PROVIDER_SITE_OTHER): Payer: BLUE CROSS/BLUE SHIELD | Admitting: Physician Assistant

## 2014-08-07 ENCOUNTER — Encounter: Payer: Self-pay | Admitting: Physician Assistant

## 2014-08-07 VITALS — BP 113/80 | HR 81 | Ht 66.5 in | Wt 149.8 lb

## 2014-08-07 DIAGNOSIS — R0789 Other chest pain: Secondary | ICD-10-CM | POA: Diagnosis not present

## 2014-08-07 DIAGNOSIS — F17200 Nicotine dependence, unspecified, uncomplicated: Secondary | ICD-10-CM

## 2014-08-07 DIAGNOSIS — Z720 Tobacco use: Secondary | ICD-10-CM | POA: Diagnosis not present

## 2014-08-07 DIAGNOSIS — R002 Palpitations: Secondary | ICD-10-CM

## 2014-08-07 NOTE — Assessment & Plan Note (Addendum)
Smoking cessation discussed. She doesn't want take any medications to help him to try to quit on her own.

## 2014-08-07 NOTE — Progress Notes (Signed)
Cardiology Office Note   Date:  08/07/2014   ID:  [name], DOB 1971-03-14, MRN [id number]  PCP:  Delphina Cahill, MD  Cardiologist:  Previous Dr. Lattie Haw  Chief Complaint: Palpitations    History of Present Illness: [name] is a 44 y.o. female who presents for referall from Dr. Delphina Cahill for worsening palpitations, dizziness and shortness of breath. She was followed by Dr. Lattie Haw but hasn't seen him since 2012 was last seen here in 2013. She also had a history of chest pain with no evidence of coronary disease and stress echo 07/2010. She had normal LV function EF 65-70% at rest and with stress. Baseline normal RV and LV function, normal aortic and mitral valve and no wall motion abnormalities.  Patient comes in today complaining of 3 week history of worsening palpitations. She says it can occur at rest but is worse with exertion. If she tries to walk to her mailbox or do any exercise she says her heart rate jumps up she becomes flushed and her blood pressure is elevated as well. She then becomes anxious and has a panic attack. One episode she called 911 and her symptoms started when away with O2 and call me her down. Yesterday she said she awakened and felt like there was an elephant squeezing her chest but also had knifelike pins shooting into her chest. She vomited a lot and when she calmed down she took her Toprol and her symptoms eased. She has always been very active and is very stressed that her heart rate and blood pressure jumping up with exercise. She drinks 4 cans of Colgate daily and smokes just under a pack of cigarettes daily. She hadn't been to a doctor in several years because of lack of insurance. She was recently told she was diabetic and has to see Dr. Nevada Crane next week for details. She had extensive blood work but doesn't know if her cholesterol is elevated or if he checked a TSH. She has a strong family history of CAD with father having an MI at 89 but dying of cancer,  mother had 3 MIs died of lung cancer and twins sisters are alive and well.    Past Medical History  Diagnosis Date  . Chest pain     normal stress echo 05/2007  . Palpitation     with tachycardia  . Tobacco abuse     25 yrs pack history 1/2 pack daily  . Hyperglycemia     fasting  . Anxiety   . Chronic low back pain   . GERD (gastroesophageal reflux disease)     EGD 01/2007 by Dr.Rourke small hiatal hernia s/p 18 french maloney   . IBS (irritable bowel syndrome)     chronic constipation h/o colon polyps in 2002 at [facility] per patient   . Closed head injury     memory disturbance  . Crohn's disease   . COPD (chronic obstructive pulmonary disease)     Past Surgical History  Procedure Laterality Date  . Appendectomy  2006  . Abdominal hysterectomy      with right salpingo oophorectomy 2005  . Oophorectomy      left for torsion and ovarian fibroma; uterine myoma resected 1995  . Cesarean section      1996  . Colonoscopy  2010    Dr. Gala Romney; negative except for hemorrhoids  . Cholecystectomy       Current Outpatient Prescriptions  Medication Sig Dispense Refill  . ALPRAZolam (XANAX) 1 MG  tablet Take 1 mg by mouth 2 (two) times daily.    . budesonide-formoterol (SYMBICORT) 160-4.5 MCG/ACT inhaler Inhale 2 puffs into the lungs 2 (two) times daily.    . cyclobenzaprine (FLEXERIL) 10 MG tablet Take 10 mg by mouth 2 (two) times daily.    . metoprolol succinate (TOPROL-XL) 100 MG 24 hr tablet Take 100 mg by mouth daily. Take with or immediately following a meal.    . omeprazole (PRILOSEC) 20 MG capsule   4  . omeprazole-sodium bicarbonate (ZEGERID) 40-1100 MG per capsule Take 1 capsule by mouth daily before breakfast. 30 capsule 0  . ondansetron (ZOFRAN ODT) 4 MG disintegrating tablet Take 1 tablet (4 mg total) by mouth every 8 (eight) hours as needed for nausea. 6 tablet 0  . SPIRIVA RESPIMAT 2.5 MCG/ACT AERS   0  . triazolam (HALCION) 0.25 MG tablet Take 0.25 mg by mouth at  bedtime.     No current facility-administered medications for this visit.    Allergies:   Codeine; Penicillins; Acetaminophen; Benadryl; Dilaudid; Hydrocodone; Morphine and related; Omeprazole; Oxycodone; Strawberry; Watermelon concentrate; and Latex    Social History:  The patient  reports that she has been smoking Cigarettes.  She has a 6 pack-year smoking history. She has never used smokeless tobacco. She reports that she does not drink alcohol or use illicit drugs.   Family History:  The patient's   family history includes Cancer in her father and mother; Diabetes in her father; Heart attack in her mother; Heart attack (age of onset: 68) in her father; Heart failure in her father and mother; Hypertension in her mother.    ROS:  Please see the history of present illness.   Otherwise, review of systems are positive for a lot of stress and anxiety around her daughter and grandson who was born at 63 weeks. 25 pound weight gain after hysterectomy..   All other systems are reviewed and negative.    PHYSICAL EXAM: VS:  BP 113/80 mmHg  Pulse 81  Ht 5' 6.5" (1.689 m)  Wt 149 lb 12.8 oz (67.949 kg)  BMI 23.82 kg/m2  SpO2 99% , BMI Body mass index is 23.82 kg/(m^2). GEN: Well nourished, well developed, in no acute distress Neck: no JVD, HJR, carotid bruits, or masses Cardiac: RRR; positive S4, no murmurs, rubs, thrill or heave,  Respiratory:  clear to auscultation bilaterally, normal work of breathing GI: soft, nontender, nondistended, + BS MS: no deformity or atrophy Extremities: without cyanosis, clubbing, edema, good distal pulses bilaterally.  Skin: warm and dry, no rash Neuro:  Strength and sensation are intact    EKG:  EKG is ordered today. The ekg ordered today demonstrates normal sinus rhythm, normal EKG   Recent Labs: 12/26/2013: BUN 12; Creatinine 0.93; Hemoglobin 16.3*; Platelets 341; Potassium 4.0; Sodium 139    Lipid Panel No results found for: CHOL, TRIG, HDL,  CHOLHDL, VLDL, LDLCALC, LDLDIRECT    Wt Readings from Last 3 Encounters:  08/07/14 149 lb 12.8 oz (67.949 kg)  12/26/13 151 lb (68.493 kg)  02/07/13 147 lb (66.679 kg)      Other studies Reviewed: Additional studies/ records that were reviewed today include and review of the records demonstrates:    ASSESSMENT AND PLAN: Palpitations Patient complains a 3 week history of worsening palpitations at rest and with exercise, but more with exercise. Is associated with an elevated blood pressure followed by stress and anxiety and panic. She drinks 4 cans of [employer]gate daily and smokes approximately  a pack of cigarettes daily. Recommend cutting back on her caffeine intake and smoking cessation. We'll place an event monitor on her to rule out any significant arrhythmias. We'll also order a stress Myoview to rule out ischemia. Follow-up in one month to become established with cardiologist. Patient should have a TSH at that has not been already drawn by Dr. Nevada Crane.   TOBACCO ABUSE Smoking cessation discussed. She doesn't want take any medications to help him to try to quit on her own.   Chest pain Patient has had some atypical chest pain. She does have multiple cardiac risk factors including diabetes mellitus, hypertension, smoker and family history of CAD. She does not know her cholesterol status but just had blood work drawn. Will order stress Myoview. She had negative stress echo in 2012.      Sumner Boast, PA-C  08/07/2014 2:39 PM    [facility] [place], [place], [place]  [address] Phone: [phone]; Fax: [phone]

## 2014-08-07 NOTE — Patient Instructions (Addendum)
Your physician recommends that you schedule a follow-up appointment in: 1 month   Your physician recommends that you continue on your current medications as directed. Please refer to the Current Medication list given to you today.  Your physician has recommended that you wear an event monitor for 30 days. Event monitors are medical devices that record the heart's electrical activity. Doctors most often [place] these monitors to diagnose arrhythmias. Arrhythmias are problems with the speed or rhythm of the heartbeat. The monitor is a small, portable device. You can wear one while you do your normal daily activities. This is usually used to diagnose what is causing palpitations/syncope (passing out).  Your physician has requested that you have en exercise stress myoview. For further information please visit [email or web address]tn. Please follow instruction sheet, as given.  Thank you for choosing [facility]!

## 2014-08-07 NOTE — Assessment & Plan Note (Addendum)
Patient complains a 3 week history of worsening palpitations at rest and with exercise, but more with exercise. Is associated with an elevated blood pressure followed by stress and anxiety and panic. She drinks 4 cans of [employer]gate daily and smokes approximately a pack of cigarettes daily. Recommend cutting back on her caffeine intake and smoking cessation. We'll place an event monitor on her to rule out any significant arrhythmias. We'll also order a stress Myoview to rule out ischemia. Follow-up in one month to become established with cardiologist. Patient should have a TSH at that has not been already drawn by Dr. Nevada Crane.

## 2014-08-07 NOTE — Assessment & Plan Note (Signed)
Patient has had some atypical chest pain. She does have multiple cardiac risk factors including diabetes mellitus, hypertension, smoker and family history of CAD. She does not know her cholesterol status but just had blood work drawn. Will order stress Myoview. She had negative stress echo in 2012.

## 2014-08-08 ENCOUNTER — Encounter (HOSPITAL_COMMUNITY)
Admission: RE | Admit: 2014-08-08 | Discharge: 2014-08-08 | Disposition: A | Discharge status: Home / Self Care | Payer: BLUE CROSS/BLUE SHIELD | Source: Ambulatory Visit | Attending: Physician Assistant | Admitting: Physician Assistant

## 2014-08-08 ENCOUNTER — Encounter (HOSPITAL_COMMUNITY): Payer: Self-pay

## 2014-08-08 ENCOUNTER — Ambulatory Visit (HOSPITAL_COMMUNITY)
Admission: RE | Admit: 2014-08-08 | Discharge: 2014-08-08 | Disposition: A | Discharge status: Home / Self Care | Payer: BLUE CROSS/BLUE SHIELD | Source: Ambulatory Visit | Attending: Physician Assistant | Admitting: Physician Assistant

## 2014-08-08 DIAGNOSIS — R002 Palpitations: Secondary | ICD-10-CM | POA: Insufficient documentation

## 2014-08-08 DIAGNOSIS — R0600 Dyspnea, unspecified: Secondary | ICD-10-CM | POA: Insufficient documentation

## 2014-08-08 DIAGNOSIS — R42 Dizziness and giddiness: Secondary | ICD-10-CM | POA: Diagnosis not present

## 2014-08-08 DIAGNOSIS — R0789 Other chest pain: Secondary | ICD-10-CM

## 2014-08-08 DIAGNOSIS — R079 Chest pain, unspecified: Secondary | ICD-10-CM | POA: Diagnosis present

## 2014-08-08 DIAGNOSIS — R0602 Shortness of breath: Secondary | ICD-10-CM | POA: Diagnosis not present

## 2014-08-08 HISTORY — DX: Essential (primary) hypertension: I10

## 2014-08-08 HISTORY — DX: Type 2 diabetes mellitus without complications: E11.9

## 2014-08-08 MED ORDER — SODIUM CHLORIDE 0.9 % IJ SOLN
INTRAMUSCULAR | Status: AC
  Administered 2014-08-08: 10 mL via INTRAVENOUS
  Filled 2014-08-08: qty 3

## 2014-08-08 MED ORDER — SODIUM CHLORIDE 0.9 % IJ SOLN
10.0000 mL | INTRAMUSCULAR | Status: DC | PRN
  Administered 2014-08-08: 10 mL via INTRAVENOUS
  Filled 2014-08-08: qty 10

## 2014-08-08 MED ORDER — TECHNETIUM TC 99M SESTAMIBI - CARDIOLITE
10.0000 | Freq: Once | INTRAVENOUS | Status: AC | PRN
  Administered 2014-08-08: 09:00:00 9.5 via INTRAVENOUS

## 2014-08-08 MED ORDER — TECHNETIUM TC 99M SESTAMIBI - CARDIOLITE
30.0000 | Freq: Once | INTRAVENOUS | Status: AC | PRN
  Administered 2014-08-08: 31 via INTRAVENOUS

## 2014-08-08 MED ORDER — REGADENOSON 0.4 MG/5ML IV SOLN
INTRAVENOUS | Status: AC
  Administered 2014-08-08: 0.4 mg via INTRAVENOUS
  Filled 2014-08-08: qty 5

## 2014-08-08 MED ORDER — REGADENOSON 0.4 MG/5ML IV SOLN
0.4000 mg | Freq: Once | INTRAVENOUS | Status: AC | PRN
  Administered 2014-08-08: 0.4 mg via INTRAVENOUS

## 2014-08-08 NOTE — Progress Notes (Signed)
Stress Lab Nurses Notes - [name]  [name] 08/08/2014 Reason for doing test: Chest Pain, Dyspnea and palpitations Type of test: Test Changed uable to reach THR, Lexiscan given Nurse performing test: Gerrit Halls, RN Nuclear Medicine Tech: Dyanne Carrel Echo Tech: Not Applicable MD performing test: Koneswaran/K.Purcell Nails NP Family MD: Nevada Crane Test explained and consent signed: Yes.   IV started: Saline lock flushed, No redness or edema and Saline lock started in radiology Symptoms: Fatigue in legs & dizziness while on TM.   Treatment/Intervention: None Reason test stopped: protocol completed After recovery IV was: Discontinued via X-ray tech and No redness or edema Patient to return to [place]. Med at : 11:15 Patient discharged: Home Patient's Condition upon discharge was: stable Comments: During test BP 97/63 & HR 92, while on TM.  Lexiscan given BP 114/70 & HR 90.  Recovery BP 105/66 & HR 83.  Symptoms resolved in recovery. [name] T

## 2014-09-06 ENCOUNTER — Ambulatory Visit: Payer: Self-pay | Admitting: Cardiovascular Disease

## 2014-09-10 ENCOUNTER — Telehealth: Payer: Self-pay | Admitting: *Deleted

## 2014-09-10 NOTE — Telephone Encounter (Signed)
EOS placed in Dr. Court Joy folder

## 2014-09-24 ENCOUNTER — Encounter (INDEPENDENT_AMBULATORY_CARE_PROVIDER_SITE_OTHER): Payer: Self-pay | Admitting: *Deleted

## 2014-09-27 ENCOUNTER — Ambulatory Visit (INDEPENDENT_AMBULATORY_CARE_PROVIDER_SITE_OTHER): Payer: BLUE CROSS/BLUE SHIELD | Admitting: Cardiovascular Disease

## 2014-09-27 ENCOUNTER — Encounter: Payer: Self-pay | Admitting: Cardiovascular Disease

## 2014-09-27 VITALS — BP 106/72 | HR 82 | Ht 66.0 in | Wt 147.0 lb

## 2014-09-27 DIAGNOSIS — R002 Palpitations: Secondary | ICD-10-CM

## 2014-09-27 DIAGNOSIS — IMO0001 Reserved for inherently not codable concepts without codable children: Secondary | ICD-10-CM

## 2014-09-27 DIAGNOSIS — R079 Chest pain, unspecified: Secondary | ICD-10-CM | POA: Diagnosis not present

## 2014-09-27 DIAGNOSIS — Z720 Tobacco use: Secondary | ICD-10-CM | POA: Diagnosis not present

## 2014-09-27 DIAGNOSIS — K219 Gastro-esophageal reflux disease without esophagitis: Secondary | ICD-10-CM

## 2014-09-27 DIAGNOSIS — Z136 Encounter for screening for cardiovascular disorders: Secondary | ICD-10-CM | POA: Diagnosis not present

## 2014-09-27 DIAGNOSIS — F419 Anxiety disorder, unspecified: Secondary | ICD-10-CM

## 2014-09-27 DIAGNOSIS — F17200 Nicotine dependence, unspecified, uncomplicated: Secondary | ICD-10-CM

## 2014-09-27 MED ORDER — NITROGLYCERIN 0.4 MG SL SUBL: 0.4000 mg | SUBLINGUAL_TABLET | SUBLINGUAL | Status: DC | PRN
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
## 2014-09-27 NOTE — Patient Instructions (Signed)
Your physician recommends that you schedule a follow-up appointment in: 3-4 weeks with Dr Bronson Ing  START Aspirin 81 mg enteric coated daily   Take nitroglycerin as directed    Thank you for choosing [facility] !

## 2014-10-02 ENCOUNTER — Encounter (INDEPENDENT_AMBULATORY_CARE_PROVIDER_SITE_OTHER): Payer: Self-pay | Admitting: *Deleted

## 2014-10-02 ENCOUNTER — Encounter (INDEPENDENT_AMBULATORY_CARE_PROVIDER_SITE_OTHER): Payer: Self-pay | Admitting: Internal Medicine

## 2014-10-02 ENCOUNTER — Ambulatory Visit (INDEPENDENT_AMBULATORY_CARE_PROVIDER_SITE_OTHER): Payer: BLUE CROSS/BLUE SHIELD | Admitting: Internal Medicine

## 2014-10-02 VITALS — BP 94/62 | HR 60 | Temp 97.6°F | Ht 66.5 in | Wt 148.0 lb

## 2014-10-02 DIAGNOSIS — R197 Diarrhea, unspecified: Secondary | ICD-10-CM

## 2014-10-02 DIAGNOSIS — R1314 Dysphagia, pharyngoesophageal phase: Secondary | ICD-10-CM

## 2014-10-02 NOTE — Progress Notes (Signed)
Subjective:    Patient ID: [name], female    DOB: Sep 03, 1970, 44 y.o.   MRN: [id number]  HPI presents today with c/o diarrhea. She has had diarrhea x 3 weeks. She has taken OTC Imodium which does not help. Hx of chronic diarrhea. She says she alternates between constipation and diarrhea. When she has diarrhea she will have at least 1-2 stools daily.  She has some tenderness on her rt lower abdomen which she has had for years.  She has not been on any recent antibiotics. Appetite is not good. She says she has lost about 20 pounds over a 6 months period.  02/07/2013 Weight 147.  She says she has been up to 160 in the past. She tells me some foods are lodging in her esophagus.  Meats are lodging in her esohopagus.  She says she is basically only living off Ensure.    09/2006 EGD/ED: Dysphagia:  IMPRESSION: 1. Normal esophagus, status post passage of a 56-French Maloney  dilator. 2. Small hiatal hernia. 3. Otherwise normal stomach, D1, D2.    11/11/2005 Diagnostic esophagogastroduodenoscopy followed by colonoscopy.  INDICATIONS FOR PROCEDURE: [name] is a 44 year old lady with regional abdominal cramps and bloody diarrhea. CT scan demonstrated thickening of the large bowel mucosa suspicious for colitis. She also has worsening reflux symptoms and reports intermittent esophageal dysphagia. She was ENDOSCOPY IMPRESSION: Esophagogastroduodenoscopy: Normal esophagus, stomach, D1, D2.  COLONOSCOPY FINDINGS: Anal papillae and internal hemorrhoids, otherwise normal rectum. Normal colon. Normal terminal ileum.   Review of [employer] child. Works at [employer].      Past Medical History  Diagnosis Date  . Chest pain     normal stress echo 05/2007  . Palpitation     with tachycardia  . Tobacco abuse     25 yrs pack history 1/2 pack daily  . Hyperglycemia     fasting  . Anxiety   . Chronic low back pain   . GERD (gastroesophageal  reflux disease)     EGD 01/2007 by Dr.Rourke small hiatal hernia s/p 49 french maloney   . IBS (irritable bowel syndrome)     chronic constipation h/o colon polyps in 2002 at [facility] per patient   . Closed head injury     memory disturbance  . Crohn's disease   . COPD (chronic obstructive pulmonary disease)   . Diabetes mellitus without complication   . Hypertension     Past Surgical History  Procedure Laterality Date  . Appendectomy  2006  . Abdominal hysterectomy      with right salpingo oophorectomy 2005  . Oophorectomy      left for torsion and ovarian fibroma; uterine myoma resected 1995  . Cesarean section      1996  . Colonoscopy  2010    Dr. Gala Romney; negative except for hemorrhoids  . Cholecystectomy      Allergies  Allergen Reactions  . Codeine Shortness Of Breath, Swelling and Rash    Throat swelling  . Penicillins Shortness Of Breath, Swelling and Other (See Comments)    Throat swells   . Acetaminophen     Per MD patient states she can't take Tylenol because of liver enzymes  . Benadryl [Diphenhydramine Hcl]   . Dilaudid [Hydromorphone Hcl]   . Hydrocodone   . Morphine And Related   . Omeprazole     REACTION: abdominal cramps  . Oxycodone     Patient states ALL pain medications make her deathly sick.  . Strawberry Swelling  .  Watermelon Concentrate Swelling  . Latex Rash    Current Outpatient Prescriptions on File Prior to Visit  Medication Sig Dispense Refill  . ALPRAZolam (XANAX) 1 MG tablet Take 1 mg by mouth 3 (three) times daily.     Marland Kitchen aspirin EC 81 MG tablet Take 81 mg by mouth daily.    . budesonide-formoterol (SYMBICORT) 160-4.5 MCG/ACT inhaler Inhale 2 puffs into the lungs 2 (two) times daily.    . cyclobenzaprine (FLEXERIL) 10 MG tablet Take 10 mg by mouth 2 (two) times daily.    . metoprolol succinate (TOPROL-XL) 100 MG 24 hr tablet Take 100 mg by mouth daily. Take with or immediately following a meal.    . nitroGLYCERIN (NITROSTAT) 0.4 MG SL  tablet Place 1 tablet (0.4 mg total) under the tongue every 5 (five) minutes as needed. 25 tablet 3  . omeprazole (PRILOSEC) 20 MG capsule Take 20 mg by mouth daily.   4  . promethazine (PHENERGAN) 25 MG tablet Take 25 mg by mouth every 6 (six) hours as needed for nausea or vomiting.    Marland Kitchen SPIRIVA RESPIMAT 2.5 MCG/ACT AERS 2 (two) times daily.   0  . triazolam (HALCION) 0.25 MG tablet Take 0.25 mg by mouth at bedtime.     No current facility-administered medications on file prior to visit.         Objective:   Physical ExamBlood pressure 94/62, pulse 60, temperature 97.6 F (36.4 C), height 5' 6.5" (1.689 m), weight 148 lb (67.132 kg).  Alert and oriented. Skin warm and dry. Oral mucosa is moist.   . Sclera anicteric, conjunctivae is pink. Thyroid not enlarged. No cervical lymphadenopathy. Lungs clear. Heart regular rate and rhythm.  Abdomen is soft. Bowel sounds are positive. No hepatomegaly. No abdominal masses felt. No tenderness.  No edema to lower extremities.         Assessment & Plan:  Solid food dysphagia. Will get a DG esophagram. Stool studies to rule out bacterial infection. Imodium BID. Fiber 4 gms dialy.

## 2014-10-02 NOTE — Patient Instructions (Addendum)
DG Esophagus,. GI pathogen.  Imodium twice a day.  Fiber 4gm daily.

## 2014-10-07 ENCOUNTER — Encounter (INDEPENDENT_AMBULATORY_CARE_PROVIDER_SITE_OTHER): Payer: Self-pay | Admitting: *Deleted

## 2014-10-07 ENCOUNTER — Ambulatory Visit (HOSPITAL_COMMUNITY)
Admission: RE | Admit: 2014-10-07 | Discharge: 2014-10-07 | Disposition: A | Discharge status: Home / Self Care | Payer: BLUE CROSS/BLUE SHIELD | Source: Ambulatory Visit | Attending: Internal Medicine | Admitting: Internal Medicine

## 2014-10-07 ENCOUNTER — Other Ambulatory Visit (INDEPENDENT_AMBULATORY_CARE_PROVIDER_SITE_OTHER): Payer: Self-pay | Admitting: *Deleted

## 2014-10-07 DIAGNOSIS — R1310 Dysphagia, unspecified: Secondary | ICD-10-CM

## 2014-10-07 DIAGNOSIS — R1314 Dysphagia, pharyngoesophageal phase: Secondary | ICD-10-CM

## 2014-10-25 ENCOUNTER — Encounter (HOSPITAL_COMMUNITY): Payer: Self-pay | Admitting: *Deleted

## 2014-10-25 ENCOUNTER — Ambulatory Visit (HOSPITAL_COMMUNITY)
Admission: RE | Admit: 2014-10-25 | Discharge: 2014-10-25 | Disposition: A | Discharge status: Home / Self Care | Payer: BLUE CROSS/BLUE SHIELD | Source: Ambulatory Visit | Attending: Internal Medicine | Admitting: Internal Medicine

## 2014-10-25 ENCOUNTER — Encounter (HOSPITAL_COMMUNITY)
Admission: RE | Disposition: A | Discharge status: Home / Self Care | Payer: Self-pay | Source: Ambulatory Visit | Attending: Internal Medicine

## 2014-10-25 DIAGNOSIS — I10 Essential (primary) hypertension: Secondary | ICD-10-CM | POA: Insufficient documentation

## 2014-10-25 DIAGNOSIS — M545 Low back pain: Secondary | ICD-10-CM | POA: Insufficient documentation

## 2014-10-25 DIAGNOSIS — G8929 Other chronic pain: Secondary | ICD-10-CM | POA: Insufficient documentation

## 2014-10-25 DIAGNOSIS — E119 Type 2 diabetes mellitus without complications: Secondary | ICD-10-CM | POA: Diagnosis not present

## 2014-10-25 DIAGNOSIS — R1310 Dysphagia, unspecified: Secondary | ICD-10-CM | POA: Diagnosis not present

## 2014-10-25 DIAGNOSIS — J449 Chronic obstructive pulmonary disease, unspecified: Secondary | ICD-10-CM | POA: Diagnosis not present

## 2014-10-25 DIAGNOSIS — Z79899 Other long term (current) drug therapy: Secondary | ICD-10-CM | POA: Diagnosis not present

## 2014-10-25 DIAGNOSIS — K449 Diaphragmatic hernia without obstruction or gangrene: Secondary | ICD-10-CM | POA: Diagnosis not present

## 2014-10-25 DIAGNOSIS — Z9049 Acquired absence of other specified parts of digestive tract: Secondary | ICD-10-CM | POA: Insufficient documentation

## 2014-10-25 DIAGNOSIS — F419 Anxiety disorder, unspecified: Secondary | ICD-10-CM | POA: Insufficient documentation

## 2014-10-25 DIAGNOSIS — K219 Gastro-esophageal reflux disease without esophagitis: Secondary | ICD-10-CM | POA: Diagnosis not present

## 2014-10-25 DIAGNOSIS — K589 Irritable bowel syndrome without diarrhea: Secondary | ICD-10-CM | POA: Insufficient documentation

## 2014-10-25 DIAGNOSIS — F17210 Nicotine dependence, cigarettes, uncomplicated: Secondary | ICD-10-CM | POA: Diagnosis not present

## 2014-10-25 DIAGNOSIS — Z7982 Long term (current) use of aspirin: Secondary | ICD-10-CM | POA: Insufficient documentation

## 2014-10-25 DIAGNOSIS — Z7951 Long term (current) use of inhaled steroids: Secondary | ICD-10-CM | POA: Insufficient documentation

## 2014-10-25 HISTORY — DX: Other specified postprocedural states: Z98.890

## 2014-10-25 HISTORY — PX: ESOPHAGOGASTRODUODENOSCOPY: SHX5428

## 2014-10-25 HISTORY — DX: Nausea with vomiting, unspecified: R11.2

## 2014-10-25 HISTORY — PX: ESOPHAGEAL DILATION: SHX303

## 2014-10-25 LAB — GLUCOSE, CAPILLARY: GLUCOSE-CAPILLARY: 94 mg/dL (ref 65–99)

## 2014-10-25 SURGERY — EGD (ESOPHAGOGASTRODUODENOSCOPY)
Anesthesia: Moderate Sedation

## 2014-10-25 MED ORDER — SODIUM CHLORIDE 0.9 % IV SOLN
INTRAVENOUS | Status: DC
  Administered 2014-10-25: 1000 mL via INTRAVENOUS

## 2014-10-25 MED ORDER — MEPERIDINE HCL 50 MG/ML IJ SOLN
INTRAMUSCULAR | Status: AC
  Filled 2014-10-25: qty 1

## 2014-10-25 MED ORDER — MIDAZOLAM HCL 5 MG/5ML IJ SOLN
INTRAMUSCULAR | Status: AC
  Filled 2014-10-25: qty 5

## 2014-10-25 MED ORDER — MEPERIDINE HCL 50 MG/ML IJ SOLN
INTRAMUSCULAR | Status: DC | PRN
  Administered 2014-10-25 (×4): 25 mg via INTRAVENOUS

## 2014-10-25 MED ORDER — DICYCLOMINE HCL 10 MG PO CAPS: 10.0000 mg | ORAL_CAPSULE | Freq: Three times a day (TID) | ORAL | Status: DC

## 2014-10-25 MED ORDER — MIDAZOLAM HCL 5 MG/5ML IJ SOLN
INTRAMUSCULAR | Status: DC | PRN
  Administered 2014-10-25: 1 mg via INTRAVENOUS
  Administered 2014-10-25 (×4): 3 mg via INTRAVENOUS

## 2014-10-25 MED ORDER — MIDAZOLAM HCL 5 MG/5ML IJ SOLN
INTRAMUSCULAR | Status: AC
  Filled 2014-10-25: qty 10

## 2014-10-25 MED ORDER — PROMETHAZINE HCL 25 MG/ML IJ SOLN
INTRAMUSCULAR | Status: DC | PRN
  Administered 2014-10-25: 25 mg via INTRAVENOUS

## 2014-10-25 MED ORDER — PROMETHAZINE HCL 25 MG/ML IJ SOLN
INTRAMUSCULAR | Status: AC
  Filled 2014-10-25: qty 1

## 2014-10-25 MED ORDER — BUTAMBEN-TETRACAINE-BENZOCAINE 2-2-14 % EX AERO
INHALATION_SPRAY | CUTANEOUS | Status: DC | PRN
  Administered 2014-10-25: 1 via TOPICAL

## 2014-10-25 MED ORDER — SODIUM CHLORIDE 0.9 % IJ SOLN
INTRAMUSCULAR | Status: AC
  Filled 2014-10-25: qty 3

## 2014-10-25 NOTE — H&P (Signed)
[name] is an 44 y.o. female.   Chief Complaint: Patient is here for EGD and ED. HPI: Patient is 44 year old Caucasian female was chronic GERD and now presents with 6 month history of intermittent solid food dysphagia. She has most difficulty with meals. She points to suprasternal area soft bolus obstruction. She has undergone esophageal dilation few occasion the past most recently May 2008. She was noted to have hiatal hernia but no ring or stricture was present. Patient is also undergoing evaluation for diarrhea felt to be most likely flareup of IBS. GI pathogen panel is pending.  Past Medical History  Diagnosis Date  . Chest pain     normal stress echo 05/2007  . Palpitation     with tachycardia  . Tobacco abuse     25 yrs pack history 1/2 pack daily  . Hyperglycemia     fasting  . Anxiety   . Chronic low back pain   . GERD (gastroesophageal reflux disease)     EGD 01/2007 by Dr.Rourke small hiatal hernia s/p 64 french maloney   . IBS (irritable bowel syndrome)     chronic constipation h/o colon polyps in 2002 at [facility] per patient   . Closed head injury     memory disturbance  . Crohn's disease   . COPD (chronic obstructive pulmonary disease)   . Diabetes mellitus without complication   . Hypertension   . PONV (postoperative nausea and vomiting)     Past Surgical History  Procedure Laterality Date  . Appendectomy  2006  . Abdominal hysterectomy      with right salpingo oophorectomy 2005  . Oophorectomy      left for torsion and ovarian fibroma; uterine myoma resected 1995  . Cesarean section      1996  . Colonoscopy  2010    Dr. Gala Romney; negative except for hemorrhoids  . Cholecystectomy      Family History  Problem Relation Age of Onset  . Cancer Mother   . Heart failure Mother   . Hypertension Mother   . Heart attack Mother   . Cancer Father   . Heart failure Father   . Diabetes Father   . Heart attack Father 71   Social History:  reports that she has been  smoking Cigarettes.  She started smoking about 30 years ago. She has a 10 pack-year smoking history. She has never used smokeless tobacco. She reports that she does not drink alcohol or use illicit drugs.  Allergies:  Allergies  Allergen Reactions  . Codeine Shortness Of Breath, Swelling and Rash    Throat swelling  . Penicillins Shortness Of Breath, Swelling and Other (See Comments)    Throat swells   . Acetaminophen     Per MD patient states she can't take Tylenol because of liver enzymes  . Benadryl [Diphenhydramine Hcl]   . Dilaudid [Hydromorphone Hcl]   . Hydrocodone   . Morphine And Related   . Oxycodone     Patient states ALL pain medications make her deathly sick.  . Strawberry Swelling  . Watermelon Concentrate Swelling  . Latex Rash    Medications Prior to Admission  Medication Sig Dispense Refill  . ALPRAZolam (XANAX) 1 MG tablet Take 1 mg by mouth 3 (three) times daily.     Marland Kitchen aspirin EC 81 MG tablet Take 81 mg by mouth daily.    . budesonide-formoterol (SYMBICORT) 160-4.5 MCG/ACT inhaler Inhale 2 puffs into the lungs 2 (two) times daily.    Marland Kitchen  cyclobenzaprine (FLEXERIL) 10 MG tablet Take 10 mg by mouth 2 (two) times daily.    . metoprolol succinate (TOPROL-XL) 100 MG 24 hr tablet Take 100 mg by mouth daily. Take with or immediately following a meal.    . nitroGLYCERIN (NITROSTAT) 0.4 MG SL tablet Place 1 tablet (0.4 mg total) under the tongue every 5 (five) minutes as needed. (Patient taking differently: Place 0.4 mg under the tongue every 5 (five) minutes as needed for chest pain. ) 25 tablet 3  . omeprazole (PRILOSEC) 20 MG capsule Take 20 mg by mouth daily.   4  . SPIRIVA RESPIMAT 2.5 MCG/ACT AERS 2 (two) times daily.   0  . triazolam (HALCION) 0.25 MG tablet Take 0.25 mg by mouth at bedtime.    Marland Kitchen venlafaxine XR (EFFEXOR-XR) 150 MG 24 hr capsule Take 150 mg by mouth daily with breakfast.    . promethazine (PHENERGAN) 25 MG tablet Take 25 mg by mouth every 6 (six)  hours as needed for nausea or vomiting.      Results for orders placed or performed during the hospital encounter of 10/25/14 (from the past 48 hour(s))  Glucose, capillary     Status: None   Collection Time: 10/25/14 10:36 AM  Result Value Ref Range   Glucose-Capillary 94 65 - 99 mg/dL   No results found.  ROS  Blood pressure 123/69, pulse 70, temperature 97.5 F (36.4 C), temperature source Oral, resp. rate 20, height 5' 6"  (1.676 m), weight 141 lb (63.957 kg), SpO2 94 %. Physical Exam  Constitutional: She appears well-developed and well-nourished.  HENT:  Mouth/Throat: Oropharynx is clear and moist.  Eyes: Conjunctivae are normal. No scleral icterus.  Neck: No thyromegaly present.  Cardiovascular: Normal rate, regular rhythm and normal heart sounds.   No murmur heard. Respiratory: Effort normal and breath sounds normal.  GI: Soft. She exhibits no distension and no mass. There is no tenderness.  Musculoskeletal: She exhibits no edema.  Lymphadenopathy:    She has no cervical adenopathy.  Neurological: She is alert.  Skin: Skin is warm and dry.     Assessment/Plan Solid food dysphagia in patient with chronic GERD. EGD with ED.  Gwendelyn Lanting U 10/25/2014, 11:15 AM

## 2014-10-25 NOTE — Discharge Instructions (Signed)
Resume usual medications and diet. Dicyclomine 10 mg by mouth 30 minutes before each meal. High fiber diet. No driving for 24 hours. Please call office with progress report in one week  Esophagogastroduodenoscopy Care After Refer to this sheet in the next few weeks. These instructions provide you with information on caring for yourself after your procedure. Your caregiver may also give you more specific instructions. Your treatment has been planned according to current medical practices, but problems sometimes occur. Call your caregiver if you have any problems or questions after your procedure.  HOME CARE INSTRUCTIONS  Do not eat or drink anything until the numbing medicine (local anesthetic) has worn off and your gag reflex has returned. You will know that the local anesthetic has worn off when you can swallow comfortably.  Do not drive for 12 hours after the procedure or as directed by your caregiver.  Only take medicines as directed by your caregiver. SEEK MEDICAL CARE IF:   You cannot stop coughing.  You are not urinating at all or less than usual. SEEK IMMEDIATE MEDICAL CARE IF:  You have difficulty swallowing.  You cannot eat or drink.  You have worsening throat or chest pain.  You have dizziness, lightheadedness, or you faint.  You have nausea or vomiting.  You have chills.  You have a fever.  You have severe abdominal pain.  You have black, tarry, or bloody stools. Document Released: 04/05/2012 Document Reviewed: 04/05/2012 [facility] Patient Information 2015 [place]. This information is not intended to replace advice given to you by your health care provider. Make sure you discuss any questions you have with your health care provider.   High-Fiber Diet Fiber is found in fruits, vegetables, and grains. A high-fiber diet encourages the addition of more whole grains, legumes, fruits, and vegetables in your diet. The recommended amount of fiber for adult males  is 38 g per day. For adult females, it is 25 g per day. Pregnant and lactating women should get 28 g of fiber per day. If you have a digestive or bowel problem, ask your caregiver for advice before adding high-fiber foods to your diet. Eat a variety of high-fiber foods instead of only a select few type of foods.  PURPOSE  To increase stool bulk.  To make bowel movements more regular to prevent constipation.  To lower cholesterol.  To prevent overeating. WHEN IS THIS DIET USED?  It may be used if you have constipation and hemorrhoids.  It may be used if you have uncomplicated diverticulosis (intestine condition) and irritable bowel syndrome.  It may be used if you need help with weight management.  It may be used if you want to add it to your diet as a protective measure against atherosclerosis, diabetes, and cancer. SOURCES OF FIBER  Whole-grain breads and cereals.  Fruits, such as apples, oranges, bananas, berries, prunes, and pears.  Vegetables, such as green peas, carrots, sweet potatoes, beets, broccoli, cabbage, spinach, and artichokes.  Legumes, such split peas, soy, lentils.  Almonds. FIBER CONTENT IN FOODS Starches and Grains / Dietary Fiber (g)  Cheerios, 1 cup / 3 g  Corn Flakes cereal, 1 cup / 0.7 g  Rice crispy treat cereal, 1 cup / 0.3 g  Instant oatmeal (cooked),  cup / 2 g  Frosted wheat cereal, 1 cup / 5.1 g  Brown, long-grain rice (cooked), 1 cup / 3.5 g  White, long-grain rice (cooked), 1 cup / 0.6 g  Enriched macaroni (cooked), 1 cup / 2.5  g Legumes / Dietary Fiber (g)  Baked beans (canned, plain, or vegetarian),  cup / 5.2 g  Kidney beans (canned),  cup / 6.8 g  Pinto beans (cooked),  cup / 5.5 g Breads and Crackers / Dietary Fiber (g)  Plain or honey graham crackers, 2 squares / 0.7 g  Saltine crackers, 3 squares / 0.3 g  Plain, salted pretzels, 10 pieces / 1.8 g  Whole-wheat bread, 1 slice / 1.9 g  White bread, 1 slice / 0.7  g  Raisin bread, 1 slice / 1.2 g  Plain bagel, 3 oz / 2 g  Flour tortilla, 1 oz / 0.9 g  Corn tortilla, 1 small / 1.5 g  Hamburger or hotdog bun, 1 small / 0.9 g Fruits / Dietary Fiber (g)  Apple with skin, 1 medium / 4.4 g  Sweetened applesauce,  cup / 1.5 g  Banana,  medium / 1.5 g  Grapes, 10 grapes / 0.4 g  Orange, 1 small / 2.3 g  Raisin, 1.5 oz / 1.6 g  Melon, 1 cup / 1.4 g Vegetables / Dietary Fiber (g)  Green beans (canned),  cup / 1.3 g  Carrots (cooked),  cup / 2.3 g  Broccoli (cooked),  cup / 2.8 g  Peas (cooked),  cup / 4.4 g  Mashed potatoes,  cup / 1.6 g  Lettuce, 1 cup / 0.5 g  Corn (canned),  cup / 1.6 g  Tomato,  cup / 1.1 g Document Released: 04/19/2005 Document Revised: 10/19/2011 Document Reviewed: 07/22/2011 [employer] Patient Information 2015 [place], [place]. This information is not intended to replace advice given to you by your health care provider. Make sure you discuss any questions you have with your health care provider.

## 2014-10-25 NOTE — Op Note (Signed)
EGD PROCEDURE REPORT  PATIENT:  [name]  MR#:  [id number] Birthdate:  Aug 29, 1970, 44 y.o., female Endoscopist:  Dr. Rogene Houston, MD Referred By:  Dr. Delphina Cahill, MD Procedure Date: 10/25/2014  Procedure:   EGD with ED  Indications:  Patient is 44 year old Caucasian female was chronic GERD and presents with solid food dysphagia. Her esophagus was last dilated in 2008 by Dr. Gala Romney. No structural abnormality was noted but she responded to esophageal dilation.            Informed Consent:  The risks, benefits, alternatives & imponderables which include, but are not limited to, bleeding, infection, perforation, drug reaction and potential missed lesion have been reviewed.  The potential for biopsy, lesion removal, esophageal dilation, etc. have also been discussed.  Questions have been answered.  All parties agreeable.  Please see history & physical in medical record for more information.  Medications:  Demerol 100 mg IV Versed 13 mg IV Promethazine 25 mg IV and diluted form Cetacaine spray topically for oropharyngeal anesthesia  Description of procedure:  The endoscope was introduced through the mouth and advanced to the second portion of the duodenum without difficulty or limitations. The mucosal surfaces were surveyed very carefully during advancement of the scope and upon withdrawal.  Findings:  Esophagus:  Mucosa of the esophagus was normal. GE junction was unremarkable without ring or stricture formation. GEJ:  38 cm Hiatus:  40 cm Stomach:  Stomach was empty and distended very well with insufflation. Folds in the proximal stomach were normal. Examination of mucosa at gastric body, antrum, pyloric channel, angularis fundus and cardia was normal. Duodenum:  Normal bulbar and post bulbar mucosa.  Therapeutic/Diagnostic Maneuvers Performed:   Esophagus dilated by passing 71 French Maloney dilator to full insertion. As the dilator was withdrawn endoscope was passed again and no  mucosal disruption noted to esophagus.  Complications:  None  Impression: Small sliding hiatal hernia otherwise normal EGD. Esophagus dilated by passing 56 French Maloney dilator but no mucosal disruption induced.  Recommendations:  Standard instructions given. Patient given prescription for dicyclomine 10 mg by mouth 30 minutes before each meal(diarrhea/IBS). Patient advised to call office with progress report in one week.  REHMAN,NAJEEB U  10/25/2014  11:46 AM  CC: Dr. Delphina Cahill, MD & Dr. Rayne Du ref. provider found

## 2014-10-28 ENCOUNTER — Encounter (HOSPITAL_COMMUNITY): Payer: Self-pay | Admitting: Internal Medicine

## 2014-11-13 ENCOUNTER — Ambulatory Visit: Payer: Self-pay | Admitting: Cardiovascular Disease

## 2014-11-27 ENCOUNTER — Ambulatory Visit: Payer: Self-pay | Admitting: Cardiovascular Disease

## 2014-11-28 ENCOUNTER — Ambulatory Visit: Payer: BLUE CROSS/BLUE SHIELD | Admitting: Cardiovascular Disease

## 2014-12-26 ENCOUNTER — Ambulatory Visit (INDEPENDENT_AMBULATORY_CARE_PROVIDER_SITE_OTHER): Payer: BLUE CROSS/BLUE SHIELD | Admitting: Cardiovascular Disease

## 2014-12-31 LAB — GASTROINTESTINAL PATHOGEN PANEL PCR
C. DIFFICILE TOX A/B, PCR: NEGATIVE
CRYPTOSPORIDIUM, PCR: NEGATIVE
Campylobacter, PCR: NEGATIVE
E COLI (ETEC) LT/ST, PCR: NEGATIVE
E coli (STEC) stx1/stx2, PCR: NEGATIVE
E coli 0157, PCR: NEGATIVE
Giardia lamblia, PCR: NEGATIVE
Norovirus, PCR: NEGATIVE
Rotavirus A, PCR: NEGATIVE
Salmonella, PCR: NEGATIVE
Shigella, PCR: NEGATIVE

## 2015-01-02 ENCOUNTER — Encounter: Payer: Self-pay | Admitting: Cardiovascular Disease

## 2015-01-02 ENCOUNTER — Telehealth: Payer: Self-pay

## 2015-01-02 ENCOUNTER — Ambulatory Visit (INDEPENDENT_AMBULATORY_CARE_PROVIDER_SITE_OTHER): Payer: BLUE CROSS/BLUE SHIELD | Admitting: Cardiovascular Disease

## 2015-01-02 VITALS — BP 116/74 | HR 80 | Ht 66.0 in | Wt 157.8 lb

## 2015-01-02 DIAGNOSIS — R002 Palpitations: Secondary | ICD-10-CM | POA: Diagnosis not present

## 2015-01-02 DIAGNOSIS — Z720 Tobacco use: Secondary | ICD-10-CM | POA: Diagnosis not present

## 2015-01-02 DIAGNOSIS — R079 Chest pain, unspecified: Secondary | ICD-10-CM | POA: Diagnosis not present

## 2015-01-02 DIAGNOSIS — Z8249 Family history of ischemic heart disease and other diseases of the circulatory system: Secondary | ICD-10-CM

## 2015-01-02 DIAGNOSIS — F419 Anxiety disorder, unspecified: Secondary | ICD-10-CM | POA: Diagnosis not present

## 2015-01-02 DIAGNOSIS — F17200 Nicotine dependence, unspecified, uncomplicated: Secondary | ICD-10-CM

## 2015-01-02 MED ORDER — RANOLAZINE ER 500 MG PO TB12: 500.0000 mg | ORAL_TABLET | Freq: Two times a day (BID) | ORAL | Status: DC

## 2015-01-02 NOTE — Telephone Encounter (Signed)
Await approval for PA from covermymeds

## 2015-01-02 NOTE — Patient Instructions (Signed)
Your physician recommends that you schedule a follow-up appointment in: 6 weeks with Dr Bronson Ing    START Ranexa 500 mg twice a day, take for 1 month and see if headaches become a problem,if not continue medication    Thank you for choosing [facility] !

## 2015-01-02 NOTE — Progress Notes (Signed)
Patient ID: [name], female   DOB: [birth year], 44 y.o.   MRN: [id number]      SUBJECTIVE: The patient presents for follow up for chest pain and palpitations.  Nuclear stress test on 08/08/14 demonstrated no evidence of ischemia or infarction with normal wall motion, LVEF 65%. Event monitoring demonstrated normal sinus rhythm with no arrhythmias seen. She has a history of anxiety, panic disorder, and tobacco abuse, and family h/o premature CAD.  Has been experiencing chest pain both with and without exertion, multiple times per week. Last night she took 2 sublingual nitroglycerin's with relief of symptoms. This occurred while she was sitting down on the couch. It was retrosternal in location, radiation around the left inframammary region and to the back. Had diaphoresis and shortness of breath.  Tried swimming a few weeks ago but became profoundly fatigued and had to stop. Has episodes where she will be performing routine daily activities and will suddenly experience "weak spells". Had some swelling in bilateral infraaxillary regions which resolved.  Says these symptoms are entirely different from her anxiety symptoms.  Review of Systems: As per "subjective", otherwise negative.  Allergies  Allergen Reactions  . Codeine Shortness Of Breath, Swelling and Rash    Throat swelling  . Penicillins Shortness Of Breath, Swelling and Other (See Comments)    Throat swells   . Acetaminophen     Per MD patient states she can't take Tylenol because of liver enzymes  . Benadryl [Diphenhydramine Hcl]   . Dilaudid [Hydromorphone Hcl]   . Hydrocodone   . Morphine And Related   . Oxycodone     Patient states ALL pain medications make her deathly sick.  . Strawberry Swelling  . Watermelon Concentrate Swelling  . Latex Rash    Current Outpatient Prescriptions  Medication Sig Dispense Refill  . ALPRAZolam (XANAX) 1 MG tablet Take 1 mg by mouth 3 (three) times daily.     Marland Kitchen aspirin EC 81 MG  tablet Take 81 mg by mouth daily.    . budesonide-formoterol (SYMBICORT) 160-4.5 MCG/ACT inhaler Inhale 2 puffs into the lungs 2 (two) times daily.    . cyclobenzaprine (FLEXERIL) 10 MG tablet Take 10 mg by mouth 2 (two) times daily.    Marland Kitchen dicyclomine (BENTYL) 10 MG capsule Take 1 capsule (10 mg total) by mouth 3 (three) times daily before meals. 90 capsule 5  . metoprolol succinate (TOPROL-XL) 100 MG 24 hr tablet Take 100 mg by mouth daily. Take with or immediately following a meal.    . nitroGLYCERIN (NITROSTAT) 0.4 MG SL tablet Place 1 tablet (0.4 mg total) under the tongue every 5 (five) minutes as needed. (Patient taking differently: Place 0.4 mg under the tongue every 5 (five) minutes as needed for chest pain. ) 25 tablet 3  . omeprazole (PRILOSEC) 20 MG capsule Take 20 mg by mouth daily.   4  . promethazine (PHENERGAN) 25 MG tablet Take 25 mg by mouth every 6 (six) hours as needed for nausea or vomiting.    Marland Kitchen SPIRIVA RESPIMAT 2.5 MCG/ACT AERS 2 (two) times daily.   0  . triazolam (HALCION) 0.25 MG tablet Take 0.25 mg by mouth at bedtime.    Marland Kitchen venlafaxine XR (EFFEXOR-XR) 150 MG 24 hr capsule Take 150 mg by mouth daily with breakfast.     No current facility-administered medications for this visit.    Past Medical History  Diagnosis Date  . Chest pain     normal stress echo 05/2007  . Palpitation  with tachycardia  . Tobacco abuse     25 yrs pack history 1/2 pack daily  . Hyperglycemia     fasting  . Anxiety   . Chronic low back pain   . GERD (gastroesophageal reflux disease)     EGD 01/2007 by Dr.Rourke small hiatal hernia s/p 105 french maloney   . IBS (irritable bowel syndrome)     chronic constipation h/o colon polyps in 2002 at [facility] per patient   . Closed head injury     memory disturbance  . Crohn's disease   . COPD (chronic obstructive pulmonary disease)   . Diabetes mellitus without complication   . Hypertension   . PONV (postoperative nausea and vomiting)     Past  Surgical History  Procedure Laterality Date  . Appendectomy  2006  . Abdominal hysterectomy      with right salpingo oophorectomy 2005  . Oophorectomy      left for torsion and ovarian fibroma; uterine myoma resected 1995  . Cesarean section      1996  . Colonoscopy  2010    Dr. Gala Romney; negative except for hemorrhoids  . Cholecystectomy    . Esophagogastroduodenoscopy N/A 10/25/2014    Procedure: ESOPHAGOGASTRODUODENOSCOPY (EGD);  Surgeon: Rogene Houston, MD;  Location: [facility];  Service: Endoscopy;  Laterality: N/A;  1250  . Esophageal dilation N/A 10/25/2014    Procedure: ESOPHAGEAL DILATION;  Surgeon: Rogene Houston, MD;  Location: [facility];  Service: Endoscopy;  Laterality: N/A;    Social History   Social History  . Marital Status: Divorced    Spouse Name: N/A  . Number of Children: N/A  . Years of Education: N/A   Occupational History  . Not on file.   Social History Main Topics  . Smoking status: Current Every Day Smoker -- 0.50 packs/day for 20 years    Types: Cigarettes    Start date: 09/26/1984  . Smokeless tobacco: Never Used  . Alcohol Use: No  . Drug Use: No  . Sexual Activity: Yes    Birth Control/ Protection: Surgical   Other Topics Concern  . Not on file   Social History Narrative     Filed Vitals:   01/02/15 1049  BP: 116/74  Pulse: 80  Height: 5' 6"  (1.676 m)  Weight: 157 lb 12.8 oz (71.578 kg)  SpO2: 97%    PHYSICAL EXAM General: NAD HEENT: Normal. Neck: No JVD, no thyromegaly. Lungs: Clear to auscultation bilaterally with normal respiratory effort. CV: Nondisplaced PMI.  Regular rate and rhythm, normal S1/S2, no S3/S4, no murmur. No pretibial or periankle edema.  No carotid bruit.  Normal pedal pulses.  Abdomen: Soft, nontender, no hepatosplenomegaly, no distention.  Neurologic: Alert and oriented x 3.  Psych: Normal affect. Skin: Normal. Musculoskeletal: Normal range of motion, no gross deformities. Extremities: No  clubbing or cyanosis.   ECG: Most recent ECG reviewed.      ASSESSMENT AND PLAN: 1. Chest pain: Has both atypical and typical features for ischemic heart disease. Normal nuclear MPI study. Currently taking Toprol-XL 100 mg daily. Has headaches so will not start nitrates. Will start Ranexa 500 mg bid. If it does not help after a month, I may either increase dose or stop altogether. Continue ASA 81 mg daily and SL nitroglycerin prn.  Would not rule out the possibility of coronary angiography in the future.  2. Palpitations: Symptomatically stable and without recurrence. No arrhythmias with event monitoring. However, she says batteries ran down  quickly and palpitations were not caught. Will continue current dose of Toprol-XL for now.   Dispo: f/u 6 weeks.  Kate Sable, M.D., F.A.C.C.

## 2015-02-19 ENCOUNTER — Ambulatory Visit (INDEPENDENT_AMBULATORY_CARE_PROVIDER_SITE_OTHER): Payer: BLUE CROSS/BLUE SHIELD | Admitting: Cardiovascular Disease

## 2015-02-19 ENCOUNTER — Encounter: Payer: Self-pay | Admitting: Cardiovascular Disease

## 2015-02-19 VITALS — BP 116/72 | HR 86 | Ht 66.0 in | Wt 151.2 lb

## 2015-02-19 DIAGNOSIS — Z8249 Family history of ischemic heart disease and other diseases of the circulatory system: Secondary | ICD-10-CM

## 2015-02-19 DIAGNOSIS — R002 Palpitations: Secondary | ICD-10-CM

## 2015-02-19 DIAGNOSIS — Z23 Encounter for immunization: Secondary | ICD-10-CM | POA: Diagnosis not present

## 2015-02-19 DIAGNOSIS — R079 Chest pain, unspecified: Secondary | ICD-10-CM

## 2015-02-19 MED ORDER — METOPROLOL SUCCINATE ER 50 MG PO TB24: ORAL_TABLET | ORAL | Status: DC

## 2015-02-19 NOTE — Progress Notes (Signed)
Patient ID: [name], female   DOB: Aug 24, 1970, 44 y.o.   MRN: [id number]      SUBJECTIVE: The patient presents for follow up for chest pain and palpitations.  Nuclear stress test on 08/08/14 demonstrated no evidence of ischemia or infarction with normal wall motion, LVEF 65%. Event monitoring demonstrated normal sinus rhythm with no arrhythmias seen. She has a history of anxiety, panic disorder, and tobacco abuse, and family h/o premature CAD.  After taking Ranexa 500 mg twice daily, her chest pain resolved. However, she began having "weak spells" where she was unable to really do anything. She stopped taking Ranexa approximately week ago and has felt well for the past 3 days without any chest pain.   Her primary complaint relates to significant tachycardia and palpitations with activities around the house such as vacuuming and sweeping. She now pays someone to vacuum her house. All of the symptoms began approximately 3-4 months ago. She asks whether it is okay to take 500 mg daily of Ranexa should her chest pain recur.   Review of Systems: As per "subjective", otherwise negative.  Allergies  Allergen Reactions  . Codeine Shortness Of Breath, Swelling and Rash    Throat swelling  . Penicillins Shortness Of Breath, Swelling and Other (See Comments)    Throat swells   . Acetaminophen     Per MD patient states she can't take Tylenol because of liver enzymes  . Benadryl [Diphenhydramine Hcl]   . Dilaudid [Hydromorphone Hcl]   . Hydrocodone   . Morphine And Related   . Oxycodone     Patient states ALL pain medications make her deathly sick.  . Strawberry Swelling  . Watermelon Concentrate Swelling  . Latex Rash    Current Outpatient Prescriptions  Medication Sig Dispense Refill  . ALPRAZolam (XANAX) 1 MG tablet Take 1 mg by mouth 3 (three) times daily.     Marland Kitchen aspirin EC 81 MG tablet Take 81 mg by mouth daily.    . cyclobenzaprine (FLEXERIL) 10 MG tablet Take 10 mg by mouth 2  (two) times daily.    Marland Kitchen dicyclomine (BENTYL) 10 MG capsule Take 1 capsule (10 mg total) by mouth 3 (three) times daily before meals. 90 capsule 5  . metoprolol succinate (TOPROL-XL) 100 MG 24 hr tablet Take 100 mg by mouth daily. Take with or immediately following a meal.    . nitroGLYCERIN (NITROSTAT) 0.4 MG SL tablet Place 1 tablet (0.4 mg total) under the tongue every 5 (five) minutes as needed. (Patient taking differently: Place 0.4 mg under the tongue every 5 (five) minutes as needed for chest pain. ) 25 tablet 3  . omeprazole (PRILOSEC) 20 MG capsule Take 20 mg by mouth daily.   4  . promethazine (PHENERGAN) 25 MG tablet Take 25 mg by mouth every 6 (six) hours as needed for nausea or vomiting.    Marland Kitchen SPIRIVA RESPIMAT 2.5 MCG/ACT AERS 2 (two) times daily.   0  . triazolam (HALCION) 0.25 MG tablet Take 0.25 mg by mouth at bedtime.    Marland Kitchen venlafaxine XR (EFFEXOR-XR) 150 MG 24 hr capsule Take 150 mg by mouth daily with breakfast.     No current facility-administered medications for this visit.    Past Medical History  Diagnosis Date  . Chest pain     normal stress echo 05/2007  . Palpitation     with tachycardia  . Tobacco abuse     25 yrs pack history 1/2 pack daily  . Hyperglycemia  fasting  . Anxiety   . Chronic low back pain   . GERD (gastroesophageal reflux disease)     EGD 01/2007 by Dr.Rourke small hiatal hernia s/p 1 french maloney   . IBS (irritable bowel syndrome)     chronic constipation h/o colon polyps in 2002 at [facility] per patient   . Closed head injury     memory disturbance  . Crohn's disease ([place])   . COPD (chronic obstructive pulmonary disease) ([place])   . Diabetes mellitus without complication ([place])   . Hypertension   . PONV (postoperative nausea and vomiting)     Past Surgical History  Procedure Laterality Date  . Appendectomy  2006  . Abdominal hysterectomy      with right salpingo oophorectomy 2005  . Oophorectomy      left for torsion and ovarian fibroma;  uterine myoma resected 1995  . Cesarean section      1996  . Colonoscopy  2010    Dr. Gala Romney; negative except for hemorrhoids  . Cholecystectomy    . Esophagogastroduodenoscopy N/A 10/25/2014    Procedure: ESOPHAGOGASTRODUODENOSCOPY (EGD);  Surgeon: Rogene Houston, MD;  [facility];  Service: Endoscopy;  Laterality: N/A;  1250  . Esophageal dilation N/A 10/25/2014    Procedure: ESOPHAGEAL DILATION;  Surgeon: Rogene Houston, MD;  Location: [facility];  Service: Endoscopy;  Laterality: N/A;    Social History   Social History  . Marital Status: Divorced    Spouse Name: N/A  . Number of Children: N/A  . Years of Education: N/A   Occupational History  . Not on file.   Social History Main Topics  . Smoking status: Current Some Day Smoker -- 0.25 packs/day for 20 years    Types: Cigarettes    Start date: 09/26/1984  . Smokeless tobacco: Never Used  . Alcohol Use: No  . Drug Use: No  . Sexual Activity: Yes    Birth Control/ Protection: Surgical   Other Topics Concern  . Not on file   Social History Narrative     Filed Vitals:   02/19/15 0825  BP: 116/72  Pulse: 86  Height: 5' 6"  (1.676 m)  Weight: 151 lb 3.2 oz (68.584 kg)  SpO2: 96%    PHYSICAL EXAM General: NAD HEENT: Normal. Neck: No JVD, no thyromegaly. Lungs: Clear to auscultation bilaterally with normal respiratory effort. CV: Nondisplaced PMI.  Regular rate and rhythm, normal S1/S2, no S3/S4, no murmur. No pretibial or periankle edema.  No carotid bruit.  Normal pedal pulses.  Abdomen: Soft, nontender, no hepatosplenomegaly, no distention.  Neurologic: Alert and oriented x 3.  Psych: Normal affect. Skin: Normal. Musculoskeletal: Normal range of motion, no gross deformities. Extremities: No clubbing or cyanosis.   ECG: Most recent ECG reviewed.      ASSESSMENT AND PLAN: 1. Chest pain: Has both atypical and typical features for ischemic heart disease. Normal nuclear MPI study. Currently  taking Toprol-XL 100 mg daily. Has headaches so will not start nitrates but may consider isosorbide dinitrate 10 mg tid in the future. I told her should her pain recur, she can certainly try Ranexa 500 mg daily, albeit it was not studied at that dose.  Continue ASA 81 mg daily and SL nitroglycerin prn.  Would not rule out the possibility of coronary angiography in the future.  2. Palpitations: Given tachycardia and palpitations with any significant exertion, will increase Toprol-XL to 125 mg daily.  Dispo: f/u 6 weeks.   Kate Sable,  M.D., F.A.C.C.

## 2015-02-19 NOTE — Addendum Note (Signed)
Addended by: Levonne Hubert on: 02/19/2015 09:22 AM   Modules accepted: Orders

## 2015-02-19 NOTE — Patient Instructions (Addendum)
Your physician recommends that you schedule a follow-up appointment in: [place] XL TO 125 MG IN THE MORNING  (* YOU WILL TAKE THE WHOLE 100 MG TABLET & 1/2 OF THE 50 MG TABLET*)   Thanks for choosing [facility]!!!

## 2015-03-26 ENCOUNTER — Ambulatory Visit: Payer: BLUE CROSS/BLUE SHIELD | Admitting: Cardiovascular Disease

## 2015-04-15 ENCOUNTER — Ambulatory Visit (INDEPENDENT_AMBULATORY_CARE_PROVIDER_SITE_OTHER): Payer: BLUE CROSS/BLUE SHIELD | Admitting: Cardiovascular Disease

## 2015-04-15 ENCOUNTER — Encounter: Payer: Self-pay | Admitting: Cardiovascular Disease

## 2015-04-15 VITALS — BP 88/58 | HR 83 | Ht 66.0 in | Wt 151.0 lb

## 2015-04-15 DIAGNOSIS — F17200 Nicotine dependence, unspecified, uncomplicated: Secondary | ICD-10-CM

## 2015-04-15 DIAGNOSIS — R002 Palpitations: Secondary | ICD-10-CM

## 2015-04-15 DIAGNOSIS — F419 Anxiety disorder, unspecified: Secondary | ICD-10-CM

## 2015-04-15 DIAGNOSIS — R079 Chest pain, unspecified: Secondary | ICD-10-CM | POA: Diagnosis not present

## 2015-04-15 DIAGNOSIS — Z8249 Family history of ischemic heart disease and other diseases of the circulatory system: Secondary | ICD-10-CM | POA: Diagnosis not present

## 2015-04-15 MED ORDER — METOPROLOL SUCCINATE ER 100 MG PO TB24: 200.0000 mg | ORAL_TABLET | Freq: Every day | ORAL | Status: DC

## 2015-04-15 MED ORDER — NITROGLYCERIN 0.4 MG SL SUBL: 0.4000 mg | SUBLINGUAL_TABLET | SUBLINGUAL | Status: DC | PRN

## 2015-04-15 NOTE — Progress Notes (Signed)
Patient ID: [name], female   DOB: 1970-12-19, 44 y.o.   MRN: [id number]      SUBJECTIVE: The patient presents for follow up for chest pain and palpitations.  Nuclear stress test on 08/08/14 demonstrated no evidence of ischemia or infarction with normal wall motion, LVEF 65%. Event monitoring demonstrated normal sinus rhythm with no arrhythmias seen. She has a history of anxiety, panic disorder, and tobacco abuse, and family h/o premature CAD.  Did not tolerate Ranexa 500 mg bid but is taking it once daily and feels it helps.  She also increased Toprol-XL on her own to 200 mg daily and feels remarkably better. She is able to vacuum her house again without difficulties. Denies dizziness and chest pain.   Review of Systems: As per "subjective", otherwise negative.  Allergies  Allergen Reactions  . Codeine Shortness Of Breath, Swelling and Rash    Throat swelling  . Penicillins Shortness Of Breath, Swelling and Other (See Comments)    Throat swells   . Acetaminophen     Per MD patient states she can't take Tylenol because of liver enzymes  . Benadryl [Diphenhydramine Hcl]   . Dilaudid [Hydromorphone Hcl]   . Hydrocodone   . Morphine And Related   . Oxycodone     Patient states ALL pain medications make her deathly sick.  . Strawberry Extract Swelling  . Watermelon Concentrate Swelling  . Latex Rash    Current Outpatient Prescriptions  Medication Sig Dispense Refill  . ALPRAZolam (XANAX) 1 MG tablet Take 1 mg by mouth 3 (three) times daily.     Marland Kitchen aspirin EC 81 MG tablet Take 81 mg by mouth daily.    . cyclobenzaprine (FLEXERIL) 10 MG tablet Take 10 mg by mouth 2 (two) times daily.    Marland Kitchen dicyclomine (BENTYL) 10 MG capsule Take 1 capsule (10 mg total) by mouth 3 (three) times daily before meals. 90 capsule 5  . metoprolol succinate (TOPROL-XL) 100 MG 24 hr tablet Take 100 mg by mouth daily. Take with or immediately following a meal.    . metoprolol succinate (TOPROL-XL) 50 MG  24 hr tablet Take 1/2 of tablet daily 90 tablet 3  . nitroGLYCERIN (NITROSTAT) 0.4 MG SL tablet Place 1 tablet (0.4 mg total) under the tongue every 5 (five) minutes as needed. (Patient taking differently: Place 0.4 mg under the tongue every 5 (five) minutes as needed for chest pain. ) 25 tablet 3  . omeprazole (PRILOSEC) 20 MG capsule Take 20 mg by mouth daily.   4  . promethazine (PHENERGAN) 25 MG tablet Take 25 mg by mouth every 6 (six) hours as needed for nausea or vomiting.    Marland Kitchen SPIRIVA RESPIMAT 2.5 MCG/ACT AERS 2 (two) times daily.   0  . triazolam (HALCION) 0.25 MG tablet Take 0.25 mg by mouth at bedtime.    Marland Kitchen venlafaxine XR (EFFEXOR-XR) 150 MG 24 hr capsule Take 150 mg by mouth daily with breakfast.     No current facility-administered medications for this visit.    Past Medical History  Diagnosis Date  . Chest pain     normal stress echo 05/2007  . Palpitation     with tachycardia  . Tobacco abuse     25 yrs pack history 1/2 pack daily  . Hyperglycemia     fasting  . Anxiety   . Chronic low back pain   . GERD (gastroesophageal reflux disease)     EGD 01/2007 by Dr.Rourke small hiatal hernia s/p  [place]   . IBS (irritable bowel syndrome)     chronic constipation h/o colon polyps in 2002 at [facility] per patient   . Closed head injury     memory disturbance  . Crohn's disease ([place])   . COPD (chronic obstructive pulmonary disease) ([place])   . Diabetes mellitus without complication ([place])   . Hypertension   . PONV (postoperative nausea and vomiting)     Past Surgical History  Procedure Laterality Date  . Appendectomy  2006  . Abdominal hysterectomy      with right salpingo oophorectomy 2005  . Oophorectomy      left for torsion and ovarian fibroma; uterine myoma resected 1995  . Cesarean section      1996  . Colonoscopy  2010    Dr. Gala Romney; negative except for hemorrhoids  . Cholecystectomy    . Esophagogastroduodenoscopy N/A 10/25/2014    Procedure:  ESOPHAGOGASTRODUODENOSCOPY (EGD);  Surgeon: Rogene Houston, MD;  [facility];  Service: Endoscopy;  Laterality: N/A;  1250  . Esophageal dilation N/A 10/25/2014    Procedure: ESOPHAGEAL DILATION;  Surgeon: Rogene Houston, MD;  [facility];  Service: Endoscopy;  Laterality: N/A;    Social History   Social History  . Marital Status: Divorced    Spouse Name: N/A  . Number of Children: N/A  . Years of Education: N/A   Occupational History  . Not on file.   Social History Main Topics  . Smoking status: Current Some Day Smoker -- 0.25 packs/day for 20 years    Types: Cigarettes    Start date: 09/26/1984  . Smokeless tobacco: Never Used  . Alcohol Use: No  . Drug Use: No  . Sexual Activity: Yes    Birth Control/ Protection: Surgical   Other Topics Concern  . Not on file   Social History Narrative     Filed Vitals:   04/15/15 1056  BP: 88/58  Pulse: 83  Height: 5' 6"  (1.676 m)  Weight: 151 lb (68.493 kg)  SpO2: 96%    PHYSICAL EXAM General: NAD HEENT: Normal. Neck: No JVD, no thyromegaly. Lungs: Clear to auscultation bilaterally with normal respiratory effort. CV: Nondisplaced PMI.  Regular rate and rhythm, normal S1/S2, no S3/S4, no murmur. No pretibial or periankle edema.   Abdomen: Soft, nontender, no distention.  Neurologic: Alert and oriented x 3.  Psych: Normal affect. Skin: Normal. Musculoskeletal: Normal range of motion, no gross deformities. Extremities: No clubbing or cyanosis.   ECG: Most recent ECG reviewed.      ASSESSMENT AND PLAN: 1. Chest pain: Has both atypical and typical features for ischemic heart disease. Normal nuclear MPI study. Currently taking Toprol-XL 200 mg daily. Has history of headaches so did not start nitrates. Taking Ranexa 500 mg daily as she feels it has helped symptoms, albeit it was not studied at that dose.  Continue ASA 81 mg daily and SL nitroglycerin prn.   2. Palpitations: Symptomatically  improved on Toprol-XL 200 mg daily (she increased dose on her own).  Dispo: f/u 3 months.   Kate Sable, M.D., F.A.C.C.

## 2015-04-15 NOTE — Patient Instructions (Signed)
Your physician recommends that you schedule a follow-up appointment in: 3 months with Dr Bronson Ing   Your physician recommends that you continue on your current medications as directed. Please refer to the Current Medication list given to you today.     If you need a refill on your cardiac medications before your next appointment, please call your pharmacy.      Thank you for choosing [facility] !

## 2015-04-29 ENCOUNTER — Ambulatory Visit (INDEPENDENT_AMBULATORY_CARE_PROVIDER_SITE_OTHER): Payer: Self-pay | Admitting: Internal Medicine

## 2015-06-05 ENCOUNTER — Ambulatory Visit (HOSPITAL_COMMUNITY): Payer: Self-pay | Admitting: Psychiatry

## 2015-06-16 ENCOUNTER — Telehealth: Payer: Self-pay | Admitting: Cardiovascular Disease

## 2015-06-16 NOTE — Telephone Encounter (Signed)
Pt called and lvm asking someone to call her back

## 2015-06-16 NOTE — Telephone Encounter (Signed)
Called pt and she wanted [place] to write her a letter to turn in to help her get on disability. I told her I would send Dr. Bronson Ing a message to see how he wanted to handle that. She also needed all her medical records, so I sent her to medical records dept.,

## 2015-06-17 NOTE — Telephone Encounter (Signed)
-----   Message from Herminio Commons, MD sent at 06/16/2015  4:55 PM EST ----- Regarding: RE: letter for disability Unfortunately due to normal stress test, there is no cardiac reason for disability. Her PCP may have additional medical documentation? I would seek PCP assistance.  ----- Message -----    From: Drema Dallas, CMA    Sent: 06/16/2015   3:45 PM      To: Herminio Commons, MD Subject: letter for disability                          Pt called wanting you to write a note for her to help get on disability. She said ya'll have discussed it at a previous appointment. ?? I told her I would ask,...and so yeah I did.

## 2015-06-17 NOTE — Telephone Encounter (Signed)
Left message on pt personal voicemail.

## 2015-07-23 ENCOUNTER — Other Ambulatory Visit: Payer: Self-pay

## 2015-07-23 ENCOUNTER — Encounter: Payer: Self-pay | Admitting: Cardiovascular Disease

## 2015-07-24 NOTE — Progress Notes (Signed)
This encounter was created in error - please disregard.

## 2015-09-14 ENCOUNTER — Emergency Department (HOSPITAL_COMMUNITY): Payer: BLUE CROSS/BLUE SHIELD

## 2015-09-14 ENCOUNTER — Encounter (HOSPITAL_COMMUNITY): Payer: Self-pay | Admitting: Emergency Medicine

## 2015-09-14 ENCOUNTER — Emergency Department (HOSPITAL_COMMUNITY)
Admission: EM | Admit: 2015-09-14 | Discharge: 2015-09-14 | Disposition: A | Discharge status: Home / Self Care | Payer: BLUE CROSS/BLUE SHIELD | Attending: Emergency Medicine | Admitting: Emergency Medicine

## 2015-09-14 DIAGNOSIS — R200 Anesthesia of skin: Secondary | ICD-10-CM | POA: Insufficient documentation

## 2015-09-14 DIAGNOSIS — N390 Urinary tract infection, site not specified: Secondary | ICD-10-CM | POA: Insufficient documentation

## 2015-09-14 DIAGNOSIS — E860 Dehydration: Secondary | ICD-10-CM | POA: Insufficient documentation

## 2015-09-14 DIAGNOSIS — F17210 Nicotine dependence, cigarettes, uncomplicated: Secondary | ICD-10-CM | POA: Insufficient documentation

## 2015-09-14 DIAGNOSIS — I10 Essential (primary) hypertension: Secondary | ICD-10-CM | POA: Insufficient documentation

## 2015-09-14 DIAGNOSIS — Z7982 Long term (current) use of aspirin: Secondary | ICD-10-CM | POA: Insufficient documentation

## 2015-09-14 DIAGNOSIS — J449 Chronic obstructive pulmonary disease, unspecified: Secondary | ICD-10-CM | POA: Insufficient documentation

## 2015-09-14 DIAGNOSIS — R000 Tachycardia, unspecified: Secondary | ICD-10-CM | POA: Insufficient documentation

## 2015-09-14 DIAGNOSIS — Z79899 Other long term (current) drug therapy: Secondary | ICD-10-CM | POA: Insufficient documentation

## 2015-09-14 DIAGNOSIS — E119 Type 2 diabetes mellitus without complications: Secondary | ICD-10-CM | POA: Insufficient documentation

## 2015-09-14 DIAGNOSIS — R112 Nausea with vomiting, unspecified: Secondary | ICD-10-CM

## 2015-09-14 LAB — URINALYSIS, ROUTINE W REFLEX MICROSCOPIC
BILIRUBIN URINE: NEGATIVE
Glucose, UA: NEGATIVE mg/dL
KETONES UR: 40 mg/dL — AB
NITRITE: POSITIVE — AB
Protein, ur: >300 mg/dL — AB
pH: 6 (ref 5.0–8.0)

## 2015-09-14 LAB — CBC WITH DIFFERENTIAL/PLATELET
BASOS PCT: 0 %
Basophils Absolute: 0 10*3/uL (ref 0.0–0.1)
EOS ABS: 0 10*3/uL (ref 0.0–0.7)
EOS PCT: 0 %
HCT: 47.4 % — ABNORMAL HIGH (ref 36.0–46.0)
Hemoglobin: 15.9 g/dL — ABNORMAL HIGH (ref 12.0–15.0)
LYMPHS ABS: 2 10*3/uL (ref 0.7–4.0)
Lymphocytes Relative: 17 %
MCH: 30.2 pg (ref 26.0–34.0)
MCHC: 33.5 g/dL (ref 30.0–36.0)
MCV: 90.1 fL (ref 78.0–100.0)
MONOS PCT: 7 %
Monocytes Absolute: 0.8 10*3/uL (ref 0.1–1.0)
Neutro Abs: 9.1 10*3/uL — ABNORMAL HIGH (ref 1.7–7.7)
Neutrophils Relative %: 76 %
Platelets: 428 10*3/uL — ABNORMAL HIGH (ref 150–400)
RBC: 5.26 MIL/uL — ABNORMAL HIGH (ref 3.87–5.11)
RDW: 13.1 % (ref 11.5–15.5)
WBC: 11.8 10*3/uL — ABNORMAL HIGH (ref 4.0–10.5)

## 2015-09-14 LAB — BLOOD GAS, ARTERIAL
Acid-Base Excess: 1.3 mmol/L (ref 0.0–2.0)
Bicarbonate: 26.3 mEq/L — ABNORMAL HIGH (ref 20.0–24.0)
DRAWN BY: 221791
FIO2: 21
O2 Saturation: 95.7 %
PATIENT TEMPERATURE: 37
PH ART: 7.51 — AB (ref 7.350–7.450)
TCO2: 19 mmol/L (ref 0–100)
pCO2 arterial: 30.4 mmHg — ABNORMAL LOW (ref 35.0–45.0)
pO2, Arterial: 77.1 mmHg — ABNORMAL LOW (ref 80.0–100.0)

## 2015-09-14 LAB — COMPREHENSIVE METABOLIC PANEL
ALT: 18 U/L (ref 14–54)
AST: 23 U/L (ref 15–41)
Albumin: 5.2 g/dL — ABNORMAL HIGH (ref 3.5–5.0)
Alkaline Phosphatase: 132 U/L — ABNORMAL HIGH (ref 38–126)
Anion gap: 15 (ref 5–15)
BUN: 14 mg/dL (ref 6–20)
CHLORIDE: 92 mmol/L — AB (ref 101–111)
CO2: 28 mmol/L (ref 22–32)
Calcium: 10.8 mg/dL — ABNORMAL HIGH (ref 8.9–10.3)
Creatinine, Ser: 0.9 mg/dL (ref 0.44–1.00)
Glucose, Bld: 137 mg/dL — ABNORMAL HIGH (ref 65–99)
POTASSIUM: 3.5 mmol/L (ref 3.5–5.1)
SODIUM: 135 mmol/L (ref 135–145)
Total Bilirubin: 1 mg/dL (ref 0.3–1.2)
Total Protein: 8.8 g/dL — ABNORMAL HIGH (ref 6.5–8.1)

## 2015-09-14 LAB — ETHANOL

## 2015-09-14 LAB — RAPID URINE DRUG SCREEN, HOSP PERFORMED
Amphetamines: NOT DETECTED
Barbiturates: NOT DETECTED
Benzodiazepines: POSITIVE — AB
Cocaine: NOT DETECTED
Opiates: NOT DETECTED
Tetrahydrocannabinol: POSITIVE — AB

## 2015-09-14 LAB — URINE MICROSCOPIC-ADD ON

## 2015-09-14 LAB — CBG MONITORING, ED: Glucose-Capillary: 126 mg/dL — ABNORMAL HIGH (ref 65–99)

## 2015-09-14 LAB — PROTIME-INR
INR: 1.03 (ref 0.00–1.49)
PROTHROMBIN TIME: 13.7 s (ref 11.6–15.2)

## 2015-09-14 LAB — AMMONIA: AMMONIA: 11 umol/L (ref 9–35)

## 2015-09-14 LAB — TROPONIN I: Troponin I: <0.03 ng/mL (ref <0.031)

## 2015-09-14 LAB — LACTIC ACID, PLASMA: LACTIC ACID, VENOUS: 2.2 mmol/L — AB (ref 0.5–2.0)

## 2015-09-14 MED ORDER — IOPAMIDOL (ISOVUE-300) INJECTION 61%
100.0000 mL | Freq: Once | INTRAVENOUS | Status: AC | PRN
  Administered 2015-09-14: 100 mL via INTRAVENOUS

## 2015-09-14 MED ORDER — LORAZEPAM 2 MG/ML IJ SOLN
INTRAMUSCULAR | Status: AC
  Filled 2015-09-14: qty 1

## 2015-09-14 MED ORDER — SODIUM CHLORIDE 0.9 % IV SOLN
INTRAVENOUS | Status: DC
  Administered 2015-09-14: 14:00:00 via INTRAVENOUS

## 2015-09-14 MED ORDER — SODIUM CHLORIDE 0.9 % IV BOLUS (SEPSIS)
1000.0000 mL | Freq: Once | INTRAVENOUS | Status: AC
  Administered 2015-09-14: 1000 mL via INTRAVENOUS

## 2015-09-14 MED ORDER — PROMETHAZINE HCL 25 MG PO TABS: 25.0000 mg | ORAL_TABLET | Freq: Four times a day (QID) | ORAL | Status: DC | PRN

## 2015-09-14 MED ORDER — LORAZEPAM 2 MG/ML IJ SOLN
1.0000 mg | Freq: Once | INTRAMUSCULAR | Status: AC
  Administered 2015-09-14: 1 mg via INTRAVENOUS

## 2015-09-14 MED ORDER — SULFAMETHOXAZOLE-TRIMETHOPRIM 800-160 MG PO TABS: 1.0000 | ORAL_TABLET | Freq: Two times a day (BID) | ORAL | Status: AC

## 2015-09-14 MED ORDER — PROCHLORPERAZINE EDISYLATE 5 MG/ML IJ SOLN
10.0000 mg | Freq: Once | INTRAMUSCULAR | Status: AC
  Administered 2015-09-14: 10 mg via INTRAVENOUS
  Filled 2015-09-14: qty 2

## 2015-09-14 MED ORDER — LEVOFLOXACIN IN D5W 750 MG/150ML IV SOLN
750.0000 mg | Freq: Once | INTRAVENOUS | Status: AC
Start: 2015-09-14 — End: 2015-09-14
  Administered 2015-09-14: 750 mg via INTRAVENOUS
  Filled 2015-09-14: qty 150

## 2015-09-14 NOTE — ED Notes (Signed)
Per daughter patient c/o vomiting x6 days with no urination x24 hours. Patient c/o numbness arms, legs, and face now. Patient mumbling and unable to assist with movement.

## 2015-09-14 NOTE — ED Notes (Signed)
Pt reported she has been vomiting all of a sudden. MD notified.

## 2015-09-14 NOTE — ED Notes (Addendum)
CRITICAL VALUE ALERT  Critical value received:  Lactic Acid 2.2  Date of notification:  09/14/15  Time of notification:  1310  Critical value read back:Yes.    Nurse who received alert:  Norm Salt, RN  MD notified (1st page):  Dr. Gilford Raid  Time of first page:  70  MD notified (2nd page):  Time of second page:  Responding MD:  Dr. Gilford Raid  Time MD responded:  1311

## 2015-09-14 NOTE — ED Provider Notes (Signed)
CSN: [id number]     Arrival date & time 09/14/15  1212 History   First MD Initiated Contact with Patient 09/14/15 1214     Chief Complaint  Patient presents with  . Emesis   PT'S DAUGHTER SAID PT HAS BEEN FEELING SICK FOR THE PAST 6 DAYS.  SHE HAS BEEN VOMITING FOR 3 DAYS.  THE DAUGHTER CHECKED ON HER YESTERDAY AND SHE WAS SITTING IN THE CHAIR AND FELT VERY WEAK, BUT SHE DID NOT WANT TO COME TO [place].  THE DAUGHTER CHECKED ON HER AGAIN TODAY, AND PT WAS IN THE SAME CHAIR WITH THE SAME CLOTHES.  PT HAD NOT MOVED.  SHE HAS NOT URINATED IN 24 HOURS.  THE PT C/O NUMBNESS ALL OVER.  PT DENIES ANY PAIN.  (Consider location/radiation/quality/duration/timing/severity/associated sxs/prior Treatment) Patient is a 45 y.o. female presenting with vomiting. The history is provided by the patient.  Emesis Severity:  Moderate Timing:  Intermittent Progression:  Worsening Chronicity:  New Recent urination:  Absent   Past Medical History  Diagnosis Date  . Chest pain     normal stress echo 05/2007  . Palpitation     with tachycardia  . Tobacco abuse     25 yrs pack history 1/2 pack daily  . Hyperglycemia     fasting  . Anxiety   . Chronic low back pain   . GERD (gastroesophageal reflux disease)     EGD 01/2007 by Dr.Rourke small hiatal hernia s/p 71 french maloney   . IBS (irritable bowel syndrome)     chronic constipation h/o colon polyps in 2002 at [facility] per patient   . Closed head injury     memory disturbance  . Crohn's disease ([place])   . COPD (chronic obstructive pulmonary disease) ([place])   . Diabetes mellitus without complication ([place])   . Hypertension   . PONV (postoperative nausea and vomiting)    Past Surgical History  Procedure Laterality Date  . Appendectomy  2006  . Abdominal hysterectomy      with right salpingo oophorectomy 2005  . Oophorectomy      left for torsion and ovarian fibroma; uterine myoma resected 1995  . Cesarean section      1996  . Colonoscopy  2010     Dr. Gala Romney; negative except for hemorrhoids  . Cholecystectomy    . Esophagogastroduodenoscopy N/A 10/25/2014    Procedure: ESOPHAGOGASTRODUODENOSCOPY (EGD);  Surgeon: Rogene Houston, MD;  [facility];  Service: Endoscopy;  Laterality: N/A;  1250  . Esophageal dilation N/A 10/25/2014    Procedure: ESOPHAGEAL DILATION;  Surgeon: Rogene Houston, MD;  Location: [facility];  Service: Endoscopy;  Laterality: N/A;   Family History  Problem Relation Age of Onset  . Cancer Mother   . Heart failure Mother   . Hypertension Mother   . Heart attack Mother   . Cancer Father   . Heart failure Father   . Diabetes Father   . Heart attack Father 93   Social History  Substance Use Topics  . Smoking status: Current Some Day Smoker -- 0.25 packs/day for 20 years    Types: Cigarettes    Start date: 09/26/1984  . Smokeless tobacco: Never Used  . Alcohol Use: No   OB History    Gravida Para Term Preterm AB TAB SAB Ectopic Multiple Living   3 1 1  2  2         Review of Systems  Gastrointestinal: Positive for nausea and vomiting.  Neurological: Positive for numbness.  All other systems reviewed and are negative.     Allergies  Codeine; Penicillins; Acetaminophen; Benadryl; Dilaudid; Hydrocodone; Morphine and related; Oxycodone; Strawberry extract; Watermelon concentrate; and Latex  Home Medications   Prior to Admission medications   Medication Sig Start Date End Date Taking? Authorizing Provider  ALPRAZolam [name]) 1 MG tablet Take 1 mg by mouth 3 (three) times daily.     Historical Provider, MD  aspirin EC 81 MG tablet Take 81 mg by mouth daily.    Historical Provider, MD  cyclobenzaprine (FLEXERIL) 10 MG tablet Take 10 mg by mouth 2 (two) times daily.    Historical Provider, MD  dicyclomine (BENTYL) 10 MG capsule Take 1 capsule (10 mg total) by mouth 3 (three) times daily before meals. 10/25/14   Rogene Houston, MD  metoprolol succinate (TOPROL-XL) 100 MG 24 hr tablet Take 2  tablets (200 mg total) by mouth daily. Take with or immediately following a meal. 04/15/15   Herminio Commons, MD  nitroGLYCERIN (NITROSTAT) 0.4 MG SL tablet Place 1 tablet (0.4 mg total) under the tongue every 5 (five) minutes as needed for chest pain. 04/15/15   Herminio Commons, MD  omeprazole (PRILOSEC) 20 MG capsule Take 20 mg by mouth daily.  08/03/14   Historical Provider, MD  promethazine (PHENERGAN) 25 MG tablet Take 1 tablet (25 mg total) by mouth every 6 (six) hours as needed for nausea or vomiting. 09/14/15   Isla Pence, MD  ranolazine (RANEXA) 500 MG 12 hr tablet Take 500 mg by mouth daily.    Historical Provider, MD  SPIRIVA RESPIMAT 2.5 MCG/ACT AERS 2 (two) times daily.  07/27/14   Historical Provider, MD  sulfamethoxazole-trimethoprim (BACTRIM DS,SEPTRA DS) 800-160 MG tablet Take 1 tablet by mouth 2 (two) times daily. 09/14/15 09/21/15  Isla Pence, MD  triazolam (HALCION) 0.25 MG tablet Take 0.25 mg by mouth at bedtime.    Historical Provider, MD  venlafaxine XR (EFFEXOR-XR) 150 MG 24 hr capsule Take 150 mg by mouth daily with breakfast.    Historical Provider, MD   BP 134/67 mmHg  Pulse 98  Temp(Src) 98 F (36.7 C) (Rectal)  Resp 13  Ht 5' 6"  (1.676 m)  Wt 151 lb (68.493 kg)  BMI 24.38 kg/m2  SpO2 100% Physical Exam  Constitutional: She is oriented to person, place, and time. She appears well-developed and well-nourished.  HENT:  Head: Normocephalic and atraumatic.  Right Ear: External ear normal.  Left Ear: External ear normal.  Nose: Nose normal.  Mouth/Throat: Mucous membranes are dry.  Eyes: Conjunctivae and EOM are normal. Pupils are equal, round, and reactive to light.  Neck: Normal range of motion. Neck supple.  Cardiovascular: Regular rhythm, normal heart sounds and intact distal pulses.  Tachycardia present.   Pulmonary/Chest: Effort normal and breath sounds normal.  Abdominal: Soft. Bowel sounds are normal.  Musculoskeletal: Normal range of motion.   Neurological: She is alert and oriented to person, place, and time.  Skin: Skin is warm and dry.  Psychiatric: Her speech is normal. Her mood appears anxious. She is slowed.  Nursing note and vitals reviewed.   ED Course  Procedures (including critical care time) Labs Review Labs Reviewed  CBC WITH DIFFERENTIAL/PLATELET - Abnormal; Notable for the following:    WBC 11.8 (*)    RBC 5.26 (*)    Hemoglobin 15.9 (*)    HCT 47.4 (*)    Platelets 428 (*)    Neutro Abs 9.1 (*)  All other components within normal limits  COMPREHENSIVE METABOLIC PANEL - Abnormal; Notable for the following:    Chloride 92 (*)    Glucose, Bld 137 (*)    Calcium 10.8 (*)    Total Protein 8.8 (*)    Albumin 5.2 (*)    Alkaline Phosphatase 132 (*)    All other components within normal limits  BLOOD GAS, ARTERIAL - Abnormal; Notable for the following:    pH, Arterial 7.510 (*)    pCO2 arterial 30.4 (*)    pO2, Arterial 77.1 (*)    Bicarbonate 26.3 (*)    Allens test (pass/fail) NOT INDICATED (*)    All other components within normal limits  URINE RAPID DRUG SCREEN, HOSP PERFORMED - Abnormal; Notable for the following:    Benzodiazepines POSITIVE (*)    Tetrahydrocannabinol POSITIVE (*)    All other components within normal limits  LACTIC ACID, PLASMA - Abnormal; Notable for the following:    Lactic Acid, Venous 2.2 (*)    All other components within normal limits  URINALYSIS, ROUTINE W REFLEX MICROSCOPIC (NOT AT [facility]) - Abnormal; Notable for the following:    Specific Gravity, Urine >1.030 (*)    Hgb urine dipstick MODERATE (*)    Ketones, ur 40 (*)    Protein, ur >300 (*)    Nitrite POSITIVE (*)    Leukocytes, UA TRACE (*)    All other components within normal limits  URINE MICROSCOPIC-ADD ON - Abnormal; Notable for the following:    Squamous Epithelial / LPF 0-5 (*)    Bacteria, UA MANY (*)    All other components within normal limits  CBG MONITORING, ED - Abnormal; Notable for the  following:    Glucose-Capillary 126 (*)    All other components within normal limits  CULTURE, BLOOD (ROUTINE X 2)  CULTURE, BLOOD (ROUTINE X 2)  URINE CULTURE  AMMONIA  ETHANOL  PROTIME-INR  TROPONIN I    Imaging Review Ct Head Wo Contrast  09/14/2015  CLINICAL DATA:  Vomiting, 6 days duration. Lack of urination. Numbness in the arms, legs and face. EXAM: CT HEAD WITHOUT CONTRAST TECHNIQUE: Contiguous axial images were obtained from the base of the skull through the vertex without intravenous contrast. COMPARISON:  None. FINDINGS: The brain has a normal appearance without evidence of malformation, atrophy, old or acute infarction, mass lesion, hemorrhage, hydrocephalus or extra-axial collection. The calvarium is unremarkable. The paranasal sinuses, middle ears and mastoids are clear. IMPRESSION: Normal head CT Electronically Signed   By: Nelson Chimes M.D.   On: 09/14/2015 14:31   Ct Abdomen Pelvis W Contrast  09/14/2015  CLINICAL DATA:  Vomiting, 6 days duration. Lack of urination. Numbness of the arms, leg and face. Personal history of Crohn's disease, appendectomy, cholecystectomy and hysterectomy. EXAM: CT ABDOMEN AND PELVIS WITH CONTRAST TECHNIQUE: Multidetector CT imaging of the abdomen and pelvis was performed using the standard protocol following bolus administration of intravenous contrast. CONTRAST:  188m ISOVUE-300 IOPAMIDOL (ISOVUE-300) INJECTION 61% COMPARISON:  None. FINDINGS: Lung bases are clear. No pleural fluid. Small amount of pericardial fluid. The liver is normal. Focal fat deposition adjacent to the falciform ligament, a common normal variant. Previous cholecystectomy. The spleen is normal. The pancreas is normal. The adrenal glands are normal. The kidneys are normal without evidence of cyst, mass, stone or hydronephrosis. Both kidneys excrete. The aorta and IVC are normal. No retroperitoneal mass or adenopathy. No free intraperitoneal fluid or air. Previous appendectomy.  Previous hysterectomy. Bladder appears normal.  No evidence of acute bowel pathology. No bone abnormality. IMPRESSION: No acute or likely significant finding. No cause of vomiting identified. Previous appendectomy, cholecystectomy and hysterectomy. Electronically Signed   By: Nelson Chimes M.D.   On: 09/14/2015 14:34   Dg Chest Port 1 View  09/14/2015  CLINICAL DATA:  45 year old female with altered mental status. Vomiting. EXAM: PORTABLE CHEST 1 VIEW COMPARISON:  Chest x-ray 02/07/2013. FINDINGS: Lung volumes are normal. No consolidative airspace disease. No pleural effusions. No pneumothorax. No pulmonary nodule or mass noted. Pulmonary vasculature and the cardiomediastinal silhouette are within normal limits. IMPRESSION: No radiographic evidence of acute cardiopulmonary disease. Electronically Signed   By: Vinnie Langton M.D.   On: 09/14/2015 13:37   I have personally reviewed and evaluated these images and lab results as part of my medical decision-making.   EKG Interpretation   Date/Time:  Sunday Sep 14 2015 12:17:44 EDT Ventricular Rate:  117 PR Interval:  101 QRS Duration: 82 QT Interval:  345 QTC Calculation: 481 R Axis:   85 Text Interpretation:  Sinus tachycardia Atrial premature complexes Right  atrial enlargement RSR' in V1 or V2, probably normal variant ST depr,  consider ischemia, inferior leads Confirmed by Kiegan Macaraeg MD, Castella Lerner ([id number])  on 09/14/2015 12:29:57 PM      MDM  PT IS FEELING MUCH BETTER NOW.  SHE IS BACK TO HER NORMAL.  ALTHOUGH LACTIC ACID LEVEL IS SLIGHTLY HIGH, I DON'T THINK PT IS SEPTIC.  SHE HAS NORMAL VITALS AND NO FEVER.  I THINK IT IS HIGH MAINLY FROM DEHYDRATION.  PT DOES NOT HAVE MEDICAL INSURANCE TO PAY FOR MEDS, SO SHE WILL BE GIVEN MORE AFFORDABLE RX.  PT KNOWS TO RETURN IF WORSE. Final diagnoses:  UTI (lower urinary tract infection)  Non-intractable vomiting with nausea, vomiting of unspecified type  Dehydration       Isla Pence,  MD 09/14/15 1446

## 2015-09-14 NOTE — ED Notes (Signed)
Pt had another episode of vomiting. MD notified.

## 2015-09-18 LAB — URINE CULTURE: Culture: >=100000 — AB

## 2015-09-19 ENCOUNTER — Telehealth (HOSPITAL_BASED_OUTPATIENT_CLINIC_OR_DEPARTMENT_OTHER): Payer: Self-pay

## 2015-09-19 LAB — CULTURE, BLOOD (ROUTINE X 2)
CULTURE: NO GROWTH
Culture: NO GROWTH

## 2015-09-19 NOTE — Telephone Encounter (Signed)
Post ED Visit - Positive Culture Follow-up  Culture report reviewed by antimicrobial stewardship pharmacist:  []  Elenor Quinones, Pharm.D. []  Heide Guile, Pharm.D., BCPS []  Parks Neptune, Pharm.D. [x]  Alycia Rossetti, Pharm.D., BCPS []  Aldrich, [place].D., BCPS, AAHIVP []  Legrand Como, Pharm.D., BCPS, AAHIVP []  Milus Glazier, Pharm.D. []  Stephens November, Pharm.D.  Positive urine culture Treated with Sulfamethoxazole-Trimethoprim, organism sensitive to the same and no further patient follow-up is required at this time.  Genia Del 09/19/2015, 9:44 AM

## 2016-01-29 ENCOUNTER — Emergency Department (HOSPITAL_COMMUNITY)
Admission: EM | Admit: 2016-01-29 | Discharge: 2016-01-29 | Discharge status: Against Medical Advice | Payer: Self-pay | Attending: Emergency Medicine | Admitting: Emergency Medicine

## 2016-01-29 ENCOUNTER — Emergency Department (HOSPITAL_COMMUNITY): Payer: Self-pay

## 2016-01-29 ENCOUNTER — Encounter (HOSPITAL_COMMUNITY): Payer: Self-pay | Admitting: Emergency Medicine

## 2016-01-29 DIAGNOSIS — R0602 Shortness of breath: Secondary | ICD-10-CM | POA: Insufficient documentation

## 2016-01-29 DIAGNOSIS — Z79899 Other long term (current) drug therapy: Secondary | ICD-10-CM | POA: Insufficient documentation

## 2016-01-29 DIAGNOSIS — J449 Chronic obstructive pulmonary disease, unspecified: Secondary | ICD-10-CM | POA: Insufficient documentation

## 2016-01-29 DIAGNOSIS — I10 Essential (primary) hypertension: Secondary | ICD-10-CM | POA: Insufficient documentation

## 2016-01-29 DIAGNOSIS — Z7982 Long term (current) use of aspirin: Secondary | ICD-10-CM | POA: Insufficient documentation

## 2016-01-29 DIAGNOSIS — F17210 Nicotine dependence, cigarettes, uncomplicated: Secondary | ICD-10-CM | POA: Insufficient documentation

## 2016-01-29 DIAGNOSIS — E119 Type 2 diabetes mellitus without complications: Secondary | ICD-10-CM | POA: Insufficient documentation

## 2016-01-29 HISTORY — DX: Post-traumatic stress disorder, unspecified: F43.10

## 2016-01-29 NOTE — ED Triage Notes (Signed)
Pt c/o sob and called EMS. O2 saturation 98% ra with nad upon EMS arrival. Pt also c/o chest pain worse with palpation and tingling in face and bilateral legs. Pt also reports a cramping feeling to right arm.

## 2016-01-29 NOTE — ED Provider Notes (Signed)
[facility] DEPT Provider Note   CSN: [id number] Arrival date & time: 01/29/16  6283  By signing my name below, I, [name], attest that this documentation has been prepared under the direction and in the presence of Christine Pew, MD. Electronically Signed: Sonum [name], Education administrator. 01/29/16. 10:52 AM.  History   Chief Complaint Chief Complaint  Patient presents with  . Shortness of Breath    The history is provided by the patient. No language interpreter was used.   HPI Comments: [name] is a 45 y.o. female with past medical history of anxiety, COPD who presents to the Emergency Department complaining of an episode of SOB along with paresthesias to lower half of face and all 4 extremities that occurred this morning. She states she felt overwhelmed and stressed prior to the episode. She complains of recent intermittent nausea and lightheadedness. She has had a cough for the past 2 weeks with associated central chest pain that is present with the cough. She denies vomiting, rash. She is a current 0.5 ppd smoker.   Past Medical History:  Diagnosis Date  . Anxiety   . Chest pain    normal stress echo 05/2007  . Chronic low back pain   . Closed head injury    memory disturbance  . COPD (chronic obstructive pulmonary disease) ([place])   . Crohn's disease ([place])   . Diabetes mellitus without complication ([place])   . GERD (gastroesophageal reflux disease)    EGD 01/2007 by Dr.Rourke small hiatal hernia s/p 17 french maloney   . Hyperglycemia    fasting  . Hypertension   . IBS (irritable bowel syndrome)    chronic constipation h/o colon polyps in 2002 at [facility] per patient   . Palpitation    with tachycardia  . PONV (postoperative nausea and vomiting)   . PTSD (post-traumatic stress disorder)   . Tobacco abuse    25 yrs pack history 1/2 pack daily    Patient Active Problem List   Diagnosis Date Noted  . Unspecified constipation 09/20/2012  . Dyspnea 01/26/2012  . Paresthesia  11/25/2011  . Abdominal pain, chronic, epigastric 11/01/2011  . Abdominal pain, acute, periumbilical 15/17/6160  . GERD (gastroesophageal reflux disease)   . Hyperglycemia   . Chronic low back pain   . IBS (irritable bowel syndrome)   . Closed head injury   . TOBACCO ABUSE 07/13/2010  . Palpitations 07/13/2010  . Chest pain 07/13/2010  . Anxiety state, unspecified 07/03/2008  . BACK PAIN, LUMBAR, CHRONIC 07/03/2008    Past Surgical History:  Procedure Laterality Date  . ABDOMINAL HYSTERECTOMY     with right salpingo oophorectomy 2005  . APPENDECTOMY  2006  . CESAREAN SECTION     1996  . CHOLECYSTECTOMY    . COLONOSCOPY  2010   Dr. Gala Romney; negative except for hemorrhoids  . ESOPHAGEAL DILATION N/A 10/25/2014   Procedure: ESOPHAGEAL DILATION;  Surgeon: Rogene Houston, MD;  [facility];  Service: Endoscopy;  Laterality: N/A;  . ESOPHAGOGASTRODUODENOSCOPY N/A 10/25/2014   Procedure: ESOPHAGOGASTRODUODENOSCOPY (EGD);  Surgeon: Rogene Houston, MD;  Location: [facility];  Service: Endoscopy;  Laterality: N/A;  1250  . OOPHORECTOMY     left for torsion and ovarian fibroma; uterine myoma resected 1995    OB History    Gravida Para Term Preterm AB Living   3 1 1   2      SAB TAB Ectopic Multiple Live Births   2  Home Medications    Prior to Admission medications   Medication Sig Start Date End Date Taking? Authorizing Provider  ALPRAZolam [name]) 1 MG tablet Take 1 mg by mouth 3 (three) times daily.     Historical Provider, MD  aspirin EC 81 MG tablet Take 81 mg by mouth daily.    Historical Provider, MD  cyclobenzaprine (FLEXERIL) 10 MG tablet Take 10 mg by mouth 2 (two) times daily.    Historical Provider, MD  dicyclomine (BENTYL) 10 MG capsule Take 1 capsule (10 mg total) by mouth 3 (three) times daily before meals. 10/25/14   Rogene Houston, MD  metoprolol succinate (TOPROL-XL) 100 MG 24 hr tablet Take 2 tablets (200 mg total) by mouth daily. Take  with or immediately following a meal. 04/15/15   Herminio Commons, MD  nitroGLYCERIN (NITROSTAT) 0.4 MG SL tablet Place 1 tablet (0.4 mg total) under the tongue every 5 (five) minutes as needed for chest pain. 04/15/15   Herminio Commons, MD  omeprazole (PRILOSEC) 20 MG capsule Take 20 mg by mouth daily.  08/03/14   Historical Provider, MD  promethazine (PHENERGAN) 25 MG tablet Take 1 tablet (25 mg total) by mouth every 6 (six) hours as needed for nausea or vomiting. 09/14/15   Isla Pence, MD  ranolazine (RANEXA) 500 MG 12 hr tablet Take 500 mg by mouth daily.    Historical Provider, MD  SPIRIVA RESPIMAT 2.5 MCG/ACT AERS 2 (two) times daily.  07/27/14   Historical Provider, MD  triazolam (HALCION) 0.25 MG tablet Take 0.25 mg by mouth at bedtime.    Historical Provider, MD  venlafaxine XR (EFFEXOR-XR) 150 MG 24 hr capsule Take 150 mg by mouth daily with breakfast.    Historical Provider, MD    Family History Family History  Problem Relation Age of Onset  . Cancer Mother   . Heart failure Mother   . Hypertension Mother   . Heart attack Mother   . Cancer Father   . Heart failure Father   . Diabetes Father   . Heart attack Father 66    Social History Social History  Substance Use Topics  . Smoking status: Current Some Day Smoker    Packs/day: 0.50    Years: 20.00    Types: Cigarettes    Start date: 09/26/1984  . Smokeless tobacco: Never Used  . Alcohol use No     Allergies   Codeine; Penicillins; Acetaminophen; Benadryl [diphenhydramine hcl]; Dilaudid [hydromorphone hcl]; Hydrocodone; Morphine and related; Oxycodone; Strawberry extract; Watermelon concentrate; and Latex   Review of Systems Review of Systems  All other systems reviewed and are negative.    Physical Exam Updated Vital Signs BP (!) 123/51   Pulse 92   Resp 12   Ht 5' 6.5" (1.689 m)   Wt 150 lb (68 kg)   SpO2 97%   BMI 23.85 kg/m   Physical Exam  Constitutional: She is oriented to person, place,  and time. She appears well-developed and well-nourished.  HENT:  Head: Normocephalic and atraumatic.  Cardiovascular: Normal rate, regular rhythm and normal heart sounds.  Exam reveals no gallop and no friction rub.   No murmur heard. Pulmonary/Chest: Effort normal and breath sounds normal. No respiratory distress. She has no wheezes. She has no rales.  Abdominal: Soft. She exhibits no distension. There is no tenderness. There is no rebound and no guarding.  Musculoskeletal:  No midline or paraspinal tenderness  Neurological: She is alert and oriented to person, place, and  time. She has normal strength and normal reflexes. She displays normal reflexes. No cranial nerve deficit or sensory deficit.  Reflex Scores:      Brachioradialis reflexes are 2+ on the right side and 2+ on the left side.      Patellar reflexes are 2+ on the right side and 2+ on the left side. Upper and lower motor strength and sensation intact. Deep tendon reflexes: patellar and brachial reflexes normal. CN intact. Finger to nose testing intact   Skin: Skin is warm and dry.  Psychiatric: She has a normal mood and affect.  Nursing note and vitals reviewed.    ED Treatments / Results  DIAGNOSTIC STUDIES: Oxygen Saturation is 98% on RA, normal by my interpretation.    COORDINATION OF CARE: 10:50 AM Discussed treatment plan with pt at bedside and pt agreed to plan.    Labs (all labs ordered are listed, but only abnormal results are displayed) Labs Reviewed - No data to display  EKG  EKG Interpretation  Date/Time:  Thursday January 29 2016 09:55:09 EDT Ventricular Rate:  95 PR Interval:    QRS Duration: 91 QT Interval:  367 QTC Calculation: 462 R Axis:   87 Text Interpretation:  Sinus rhythm RSR' in V1 or V2, right VCD or RVH Confirmed by [facility] MD, Miu Chiong [phone]) on 01/29/2016 10:33:39 AM       Radiology Dg Chest 2 View  Result Date: 01/29/2016 CLINICAL DATA:  Congestion 2 weeks with midline chest  pain 2 days. Numbness in arms and legs this morning. EXAM: CHEST  2 VIEW COMPARISON:  09/14/2015 FINDINGS: Lungs are adequately inflated without consolidation or effusion. Cardiomediastinal silhouette is within normal. Bones and soft tissues are normal. IMPRESSION: No active cardiopulmonary disease. Electronically Signed   By: Marin Olp M.D.   On: 01/29/2016 10:32    Procedures Procedures (including critical care time)  Medications Ordered in ED Medications - No data to display   Initial Impression / Assessment and Plan / ED Course  I have reviewed the triage vital signs and the nursing notes.  Pertinent labs & imaging results that were available during my care of the patient were reviewed by me and considered in my medical decision making (see chart for details).  Clinical Course    Likely anxiety attack but had planned for ACS/PE rule out but patient eloped.   Final Clinical Impressions(s) / ED Diagnoses   Final diagnoses:  SOB (shortness of breath)    New Prescriptions Discharge Medication List as of 01/29/2016 12:07 PM     I personally performed the services described in this documentation, which was scribed in my presence. The recorded information has been reviewed and is accurate.    Christine Pew, MD 01/29/16 1550

## 2016-01-29 NOTE — ED Notes (Signed)
RN called to room. Pt states she feels much better and wants to leave. RN explained to pt risks of leave AMA prior to completing work up, including death. Pt states she understands and she is not angry, just feeling much better and wants to leave. Pt signed e-sig AMA, nad noted.

## 2016-05-09 ENCOUNTER — Observation Stay (HOSPITAL_COMMUNITY)
Admission: EM | Admit: 2016-05-09 | Discharge: 2016-05-10 | Disposition: A | Discharge status: Home / Self Care | Payer: Self-pay | Attending: Family Medicine | Admitting: Family Medicine

## 2016-05-09 ENCOUNTER — Encounter (HOSPITAL_COMMUNITY): Payer: Self-pay

## 2016-05-09 ENCOUNTER — Emergency Department (HOSPITAL_COMMUNITY): Payer: Self-pay

## 2016-05-09 DIAGNOSIS — F4310 Post-traumatic stress disorder, unspecified: Secondary | ICD-10-CM | POA: Insufficient documentation

## 2016-05-09 DIAGNOSIS — F419 Anxiety disorder, unspecified: Secondary | ICD-10-CM | POA: Insufficient documentation

## 2016-05-09 DIAGNOSIS — Z79899 Other long term (current) drug therapy: Secondary | ICD-10-CM | POA: Insufficient documentation

## 2016-05-09 DIAGNOSIS — Z720 Tobacco use: Secondary | ICD-10-CM | POA: Diagnosis present

## 2016-05-09 DIAGNOSIS — K219 Gastro-esophageal reflux disease without esophagitis: Secondary | ICD-10-CM | POA: Diagnosis present

## 2016-05-09 DIAGNOSIS — R0781 Pleurodynia: Secondary | ICD-10-CM | POA: Diagnosis present

## 2016-05-09 DIAGNOSIS — I509 Heart failure, unspecified: Secondary | ICD-10-CM | POA: Insufficient documentation

## 2016-05-09 DIAGNOSIS — K50919 Crohn's disease, unspecified, with unspecified complications: Secondary | ICD-10-CM | POA: Insufficient documentation

## 2016-05-09 DIAGNOSIS — R002 Palpitations: Secondary | ICD-10-CM | POA: Diagnosis present

## 2016-05-09 DIAGNOSIS — F17210 Nicotine dependence, cigarettes, uncomplicated: Secondary | ICD-10-CM | POA: Diagnosis present

## 2016-05-09 DIAGNOSIS — K5090 Crohn's disease, unspecified, without complications: Secondary | ICD-10-CM | POA: Insufficient documentation

## 2016-05-09 DIAGNOSIS — R079 Chest pain, unspecified: Secondary | ICD-10-CM | POA: Diagnosis present

## 2016-05-09 DIAGNOSIS — Z7984 Long term (current) use of oral hypoglycemic drugs: Secondary | ICD-10-CM | POA: Insufficient documentation

## 2016-05-09 DIAGNOSIS — E119 Type 2 diabetes mellitus without complications: Secondary | ICD-10-CM

## 2016-05-09 DIAGNOSIS — J449 Chronic obstructive pulmonary disease, unspecified: Principal | ICD-10-CM | POA: Diagnosis present

## 2016-05-09 DIAGNOSIS — F17200 Nicotine dependence, unspecified, uncomplicated: Secondary | ICD-10-CM | POA: Diagnosis present

## 2016-05-09 HISTORY — DX: Unspecified asthma, uncomplicated: J45.909

## 2016-05-09 HISTORY — DX: Heart failure, unspecified: I50.9

## 2016-05-09 HISTORY — DX: Systemic involvement of connective tissue, unspecified: M35.9

## 2016-05-09 LAB — BASIC METABOLIC PANEL
Anion gap: 8 (ref 5–15)
BUN: 8 mg/dL (ref 6–20)
CALCIUM: 9.4 mg/dL (ref 8.9–10.3)
CO2: 25 mmol/L (ref 22–32)
CREATININE: 0.8 mg/dL (ref 0.44–1.00)
Chloride: 105 mmol/L (ref 101–111)
GFR calc non Af Amer: >60 mL/min (ref >60)
GLUCOSE: 124 mg/dL — AB (ref 65–99)
Potassium: 3.6 mmol/L (ref 3.5–5.1)
Sodium: 138 mmol/L (ref 135–145)

## 2016-05-09 LAB — CBC WITH DIFFERENTIAL/PLATELET
BASOS PCT: 0 %
Basophils Absolute: 0 10*3/uL (ref 0.0–0.1)
Eosinophils Absolute: 0.1 10*3/uL (ref 0.0–0.7)
Eosinophils Relative: 2 %
HEMATOCRIT: 40.2 % (ref 36.0–46.0)
Hemoglobin: 13.4 g/dL (ref 12.0–15.0)
LYMPHS ABS: 1.9 10*3/uL (ref 0.7–4.0)
Lymphocytes Relative: 27 %
MCH: 30.2 pg (ref 26.0–34.0)
MCHC: 33.3 g/dL (ref 30.0–36.0)
MCV: 90.7 fL (ref 78.0–100.0)
MONO ABS: 0.4 10*3/uL (ref 0.1–1.0)
MONOS PCT: 5 %
NEUTROS ABS: 4.6 10*3/uL (ref 1.7–7.7)
Neutrophils Relative %: 66 %
Platelets: 303 10*3/uL (ref 150–400)
RBC: 4.43 MIL/uL (ref 3.87–5.11)
RDW: 13 % (ref 11.5–15.5)
WBC: 7 10*3/uL (ref 4.0–10.5)

## 2016-05-09 LAB — TSH: TSH: 0.716 u[IU]/mL (ref 0.350–4.500)

## 2016-05-09 LAB — GLUCOSE, CAPILLARY
GLUCOSE-CAPILLARY: 111 mg/dL — AB (ref 65–99)
Glucose-Capillary: 117 mg/dL — ABNORMAL HIGH (ref 65–99)

## 2016-05-09 LAB — TROPONIN I: Troponin I: <0.03 ng/mL (ref <0.03)

## 2016-05-09 LAB — D-DIMER, QUANTITATIVE: D-Dimer, Quant: 0.43 ug/mL-FEU (ref 0.00–0.50)

## 2016-05-09 MED ORDER — PANTOPRAZOLE SODIUM 40 MG PO TBEC
40.0000 mg | DELAYED_RELEASE_TABLET | Freq: Two times a day (BID) | ORAL | Status: DC
  Administered 2016-05-09 – 2016-05-10 (×3): 40 mg via ORAL
  Filled 2016-05-09 (×3): qty 1

## 2016-05-09 MED ORDER — ZOLPIDEM TARTRATE 5 MG PO TABS
5.0000 mg | ORAL_TABLET | Freq: Every evening | ORAL | Status: DC | PRN
  Administered 2016-05-09: 5 mg via ORAL
  Filled 2016-05-09: qty 1

## 2016-05-09 MED ORDER — GI COCKTAIL ~~LOC~~
30.0000 mL | Freq: Once | ORAL | Status: AC
Start: 2016-05-09 — End: 2016-05-09
  Administered 2016-05-09: 30 mL via ORAL
  Filled 2016-05-09: qty 30

## 2016-05-09 MED ORDER — FENTANYL CITRATE (PF) 100 MCG/2ML IJ SOLN
50.0000 ug | Freq: Once | INTRAMUSCULAR | Status: AC
  Administered 2016-05-09: 50 ug via INTRAVENOUS
  Filled 2016-05-09: qty 2

## 2016-05-09 MED ORDER — ASPIRIN EC 325 MG PO TBEC
325.0000 mg | DELAYED_RELEASE_TABLET | Freq: Every day | ORAL | Status: DC
  Administered 2016-05-10: 325 mg via ORAL
  Filled 2016-05-09: qty 1

## 2016-05-09 MED ORDER — IBUPROFEN 400 MG PO TABS: 400.0000 mg | ORAL_TABLET | Freq: Once | ORAL | Status: DC

## 2016-05-09 MED ORDER — ALBUTEROL SULFATE (2.5 MG/3ML) 0.083% IN NEBU: 2.5000 mg | INHALATION_SOLUTION | Freq: Four times a day (QID) | RESPIRATORY_TRACT | Status: DC | PRN

## 2016-05-09 MED ORDER — NICOTINE 21 MG/24HR TD PT24
21.0000 mg | MEDICATED_PATCH | Freq: Every day | TRANSDERMAL | Status: DC
  Administered 2016-05-10: 21 mg via TRANSDERMAL
  Filled 2016-05-09: qty 1

## 2016-05-09 MED ORDER — FENTANYL CITRATE (PF) 100 MCG/2ML IJ SOLN
25.0000 ug | INTRAMUSCULAR | Status: DC | PRN
  Administered 2016-05-10 (×2): 25 ug via INTRAVENOUS
  Filled 2016-05-09 (×2): qty 2

## 2016-05-09 MED ORDER — ONDANSETRON HCL 4 MG/2ML IJ SOLN
4.0000 mg | Freq: Once | INTRAMUSCULAR | Status: AC
  Administered 2016-05-09: 4 mg via INTRAVENOUS

## 2016-05-09 MED ORDER — KETOROLAC TROMETHAMINE 30 MG/ML IJ SOLN
30.0000 mg | Freq: Once | INTRAMUSCULAR | Status: AC
  Administered 2016-05-09: 30 mg via INTRAVENOUS
  Filled 2016-05-09: qty 1

## 2016-05-09 MED ORDER — INSULIN ASPART 100 UNIT/ML ~~LOC~~ SOLN: 0.0000 [IU] | Freq: Three times a day (TID) | SUBCUTANEOUS | Status: DC

## 2016-05-09 MED ORDER — NITROGLYCERIN 2 % TD OINT
1.0000 [in_us] | TOPICAL_OINTMENT | Freq: Four times a day (QID) | TRANSDERMAL | Status: DC
  Administered 2016-05-09 – 2016-05-10 (×3): 1 [in_us] via TOPICAL
  Filled 2016-05-09 (×3): qty 1

## 2016-05-09 MED ORDER — ONDANSETRON HCL 4 MG/2ML IJ SOLN
INTRAMUSCULAR | Status: AC
  Administered 2016-05-09: 4 mg via INTRAVENOUS
  Filled 2016-05-09: qty 2

## 2016-05-09 MED ORDER — LORAZEPAM 0.5 MG PO TABS
0.5000 mg | ORAL_TABLET | Freq: Once | ORAL | Status: AC
  Administered 2016-05-09: 0.5 mg via ORAL
  Filled 2016-05-09: qty 1

## 2016-05-09 MED ORDER — HEPARIN SODIUM (PORCINE) 5000 UNIT/ML IJ SOLN
5000.0000 [IU] | Freq: Three times a day (TID) | INTRAMUSCULAR | Status: DC
  Administered 2016-05-09 – 2016-05-10 (×3): 5000 [IU] via SUBCUTANEOUS
  Filled 2016-05-09 (×3): qty 1

## 2016-05-09 MED ORDER — NITROGLYCERIN 0.4 MG SL SUBL
0.4000 mg | SUBLINGUAL_TABLET | SUBLINGUAL | Status: AC | PRN
  Administered 2016-05-09 (×3): 0.4 mg via SUBLINGUAL
  Filled 2016-05-09 (×2): qty 1

## 2016-05-09 MED ORDER — ASPIRIN 81 MG PO CHEW
324.0000 mg | CHEWABLE_TABLET | Freq: Once | ORAL | Status: AC
  Administered 2016-05-09: 324 mg via ORAL
  Filled 2016-05-09: qty 4

## 2016-05-09 MED ORDER — GI COCKTAIL ~~LOC~~: 30.0000 mL | Freq: Four times a day (QID) | ORAL | Status: DC | PRN

## 2016-05-09 MED ORDER — TIOTROPIUM BROMIDE MONOHYDRATE 18 MCG IN CAPS
18.0000 ug | ORAL_CAPSULE | Freq: Every day | RESPIRATORY_TRACT | Status: DC
  Filled 2016-05-09: qty 5

## 2016-05-09 MED ORDER — ALBUTEROL SULFATE HFA 108 (90 BASE) MCG/ACT IN AERS: 2.0000 | INHALATION_SPRAY | Freq: Four times a day (QID) | RESPIRATORY_TRACT | Status: DC | PRN

## 2016-05-09 MED ORDER — IBUPROFEN 600 MG PO TABS
600.0000 mg | ORAL_TABLET | Freq: Four times a day (QID) | ORAL | Status: DC | PRN
  Administered 2016-05-09 – 2016-05-10 (×4): 600 mg via ORAL
  Filled 2016-05-09 (×4): qty 1

## 2016-05-09 MED ORDER — ONDANSETRON HCL 4 MG/2ML IJ SOLN
4.0000 mg | Freq: Four times a day (QID) | INTRAMUSCULAR | Status: DC | PRN
  Administered 2016-05-10: 4 mg via INTRAVENOUS
  Filled 2016-05-09 (×2): qty 2

## 2016-05-09 NOTE — Progress Notes (Signed)
**Note De-Identified  Obfuscation** STAT EKG completed; results reported to RN

## 2016-05-09 NOTE — ED Provider Notes (Addendum)
[facility] DEPT Provider Note   CSN: [id number] Arrival date & time: 05/09/16  1035  By signing my name below, I, Hilbert Odor, attest that this documentation has been prepared under the direction and in the presence of Fatima Blank, MD. Electronically Signed: Hilbert Odor, Scribe. 05/09/16. 11:24 AM. History   Chief Complaint Chief Complaint  Patient presents with  . Chest Pain    The history is provided by the patient. No language interpreter was used.   HPI Comments: [name] is a 46 y.o. female who presents to the Emergency Department complaining of constant chest pain that began around 10:30am this morning.  Patient reports some tightness in her chest last night that lasted for around 15 minutes and then went away. She states that it felt like someone was standing on her chest. She reports having some nausea while ambulating. Patient also reports SOB and diaphoresis. Patient woke up early this morning around 5 am with no pain. She then went back to bed and was awoken by more pain in her chest. She reports some associated tingliness in her left arm at that time. Patient currently takes NTG. Patient denies taking Lasix. She denies hx of smoking, cancer, and PE. She denies any leg swelling. She denies any recent travel history. She also denies taking birth control. Her mother and father both had an MI in the past. Her chest pain has almost resolved.   Past Medical History:  Diagnosis Date  . Anxiety   . Chest pain    normal stress echo 05/2007  . Chronic low back pain   . Closed head injury    memory disturbance  . COPD (chronic obstructive pulmonary disease) ([place])   . Crohn's disease ([place])   . Diabetes mellitus without complication ([place])   . GERD (gastroesophageal reflux disease)    EGD 01/2007 by Dr.Rourke small hiatal hernia s/p 46 french maloney   . Hyperglycemia    fasting  . Hypertension   . IBS (irritable bowel syndrome)    chronic constipation h/o  colon polyps in 2002 at [facility] per patient   . Palpitation    with tachycardia  . PONV (postoperative nausea and vomiting)   . PTSD (post-traumatic stress disorder)   . Tobacco abuse    25 yrs pack history 1/2 pack daily    Patient Active Problem List   Diagnosis Date Noted  . Unspecified constipation 09/20/2012  . Dyspnea 01/26/2012  . Paresthesia 11/25/2011  . Abdominal pain, chronic, epigastric 11/01/2011  . Abdominal pain, acute, periumbilical 20/94/7096  . GERD (gastroesophageal reflux disease)   . Hyperglycemia   . Chronic low back pain   . IBS (irritable bowel syndrome)   . Closed head injury   . TOBACCO ABUSE 07/13/2010  . Palpitations 07/13/2010  . Chest pain 07/13/2010  . Anxiety state, unspecified 07/03/2008  . BACK PAIN, LUMBAR, CHRONIC 07/03/2008    Past Surgical History:  Procedure Laterality Date  . ABDOMINAL HYSTERECTOMY     with right salpingo oophorectomy 2005  . APPENDECTOMY  2006  . CESAREAN SECTION     1996  . CHOLECYSTECTOMY    . COLONOSCOPY  2010   Dr. Gala Romney; negative except for hemorrhoids  . ESOPHAGEAL DILATION N/A 10/25/2014   Procedure: ESOPHAGEAL DILATION;  Surgeon: Rogene Houston, MD;  Location: [facility];  Service: Endoscopy;  Laterality: N/A;  . ESOPHAGOGASTRODUODENOSCOPY N/A 10/25/2014   Procedure: ESOPHAGOGASTRODUODENOSCOPY (EGD);  Surgeon: Rogene Houston, MD;  Location: [facility] SU[facility];  Service:  Endoscopy;  Laterality: N/A;  1250  . OOPHORECTOMY     left for torsion and ovarian fibroma; uterine myoma resected 1995    OB History    Gravida Para Term Preterm AB Living   3 1 1   2      SAB TAB Ectopic Multiple Live Births   2               Home Medications    Prior to Admission medications   Medication Sig Start Date End Date Taking? Authorizing Provider  albuterol (PROVENTIL HFA;VENTOLIN HFA) 108 (90 Base) MCG/ACT inhaler Inhale 2 puffs into the lungs every 6 (six) hours as needed for wheezing or shortness of breath.   Yes  Historical Provider, MD  ibuprofen (ADVIL,MOTRIN) 200 MG tablet Take 600 mg by mouth every 6 (six) hours as needed.   Yes Historical Provider, MD  OVER THE COUNTER MEDICATION Take 1 tablet by mouth as needed. Acid reducer she gets from [employer]   Yes Historical Provider, MD  SPIRIVA RESPIMAT 2.5 MCG/ACT AERS 2 (two) times daily.  07/27/14  Yes Historical Provider, MD  nitroGLYCERIN (NITROSTAT) 0.4 MG SL tablet Place 1 tablet (0.4 mg total) under the tongue every 5 (five) minutes as needed for chest pain. 04/15/15   Herminio Commons, MD    Family History Family History  Problem Relation Age of Onset  . Cancer Mother   . Heart failure Mother   . Hypertension Mother   . Heart attack Mother   . Cancer Father   . Heart failure Father   . Diabetes Father   . Heart attack Father 47    Social History Social History  Substance Use Topics  . Smoking status: Current Some Day Smoker    Packs/day: 0.50    Years: 20.00    Types: Cigarettes    Start date: 09/26/1984  . Smokeless tobacco: Never Used  . Alcohol use No     Allergies   Codeine; Penicillins; Acetaminophen; Benadryl [diphenhydramine hcl]; Dilaudid [hydromorphone hcl]; Hydrocodone; Morphine and related; Oxycodone; Strawberry extract; Watermelon concentrate; and Latex   Review of Systems Review of Systems A complete 10 system review of systems was obtained and all systems are negative except as noted in the HPI and PMH.    Physical Exam Updated Vital Signs BP 107/77   Pulse 83   Temp 98.4 F (36.9 C) (Oral)   Resp 18   Ht 5' 6"  (1.676 m)   Wt 140 lb (63.5 kg)   SpO2 99%   BMI 22.60 kg/m   Physical Exam  Constitutional: She is oriented to person, place, and time. She appears well-developed and well-nourished. No distress.  HENT:  Head: Normocephalic and atraumatic.  Nose: Nose normal.  Eyes: Conjunctivae and EOM are normal. Pupils are equal, round, and reactive to light. Right eye exhibits no discharge. Left  eye exhibits no discharge. No scleral icterus.  Neck: Normal range of motion. Neck supple.  Cardiovascular: Normal rate and regular rhythm.  Exam reveals no gallop and no friction rub.   No murmur heard. Pulmonary/Chest: Effort normal and breath sounds normal. No stridor. No respiratory distress. She has no rales.  Abdominal: Soft. She exhibits no distension. There is no tenderness.  Musculoskeletal: She exhibits no edema or tenderness.  Neurological: She is alert and oriented to person, place, and time.  Skin: Skin is warm and dry. No rash noted. She is not diaphoretic. No erythema.  Psychiatric: She has a normal mood and affect.  Vitals reviewed.    ED Treatments / Results  DIAGNOSTIC STUDIES: Oxygen Saturation is 100% on RA, normal by my interpretation.    COORDINATION OF CARE: 11:24 AM Discussed treatment plan with pt at bedside and pt agreed to plan.  Labs (all labs ordered are listed, but only abnormal results are displayed) Labs Reviewed  BASIC METABOLIC PANEL - Abnormal; Notable for the following:       Result Value   Glucose, Bld 124 (*)    All other components within normal limits  CBC WITH DIFFERENTIAL/PLATELET  TROPONIN I    EKG  EKG Interpretation  Date/Time:  Sunday May 09 2016 10:43:16 EST Ventricular Rate:  110 PR Interval:    QRS Duration: 94 QT Interval:  347 QTC Calculation: 470 R Axis:   88 Text Interpretation:  Fast sinus arrhythmia RSR' in V1 or V2, right VCD or RVH mild diffuse  ST depression, nonspecific Reconfirmed by [facility] MD, PEDRO ([id number]) on 05/09/2016 11:20:48 AM       Radiology Dg Chest 2 View  Result Date: 05/09/2016 CLINICAL DATA:  Chest pain and shortness of breath today. EXAM: CHEST  2 VIEW COMPARISON:  PA and lateral chest 01/29/2016. FINDINGS: Lungs are clear. Heart size is normal. No pneumothorax or pleural effusion. No bony abnormality. IMPRESSION: Negative chest. Electronically Signed   By: Inge Rise M.D.   On:  05/09/2016 11:09    Procedures Procedures (including critical care time)  Medications Ordered in ED Medications  nitroGLYCERIN (NITROSTAT) SL tablet 0.4 mg (0.4 mg Sublingual Given 05/09/16 1248)  ibuprofen (ADVIL,MOTRIN) tablet 400 mg (not administered)  fentaNYL (SUBLIMAZE) injection 50 mcg (not administered)  aspirin chewable tablet 324 mg (324 mg Oral Given 05/09/16 1157)  ondansetron (ZOFRAN) injection 4 mg (4 mg Intravenous Given 05/09/16 1254)     Initial Impression / Assessment and Plan / ED Course  I have reviewed the triage vital signs and the nursing notes.  Pertinent labs & imaging results that were available during my care of the patient were reviewed by me and considered in my medical decision making (see chart for details).  Clinical Course as of May 09 1306  Sun May 09, 2016  1245 Typical chest pain.improved with NTG. HEAR >4. No prior cardiac history. Patient has had a negative stress test in 2009, inconclusive stress test 2016; scheduled for cath but never obtained it due to insurance reasons. EKG with diffuse ST depressions which are nonspecific. Initial troponin negative. Given presentation patient will require admission for ACS rule out.  PERC negative; doubt PE. Not classic for aortic dissection or esophageal perforation.  [PC]  2633 Admitted to the hospitalist service for ACS rule out.  [PC]    Clinical Course User Index [PC] Fatima Blank, MD      Final Clinical Impressions(s) / ED Diagnoses   Final diagnoses:  None  I personally performed the services described in this documentation, which was scribed in my presence. The recorded information has been reviewed and is accurate.        Fatima Blank, MD 05/09/16 [place], MD 05/31/16 1344

## 2016-05-09 NOTE — H&P (Signed)
History and Physical  [name] BVQ:[id number] DOB: Sep 21, 1970 DOA: 05/09/2016  Referring physician: Leonette Monarch PCP: Wende Neighbors, MD  Outpatient Specialists:  1. Cardiologist: Elias Else  Chief Complaint: chest pain   HPI: [name] is a 46 y.o. female with long history of chest pain had been followed by cardiology in 2016 and lost to follow up who presents to the Emergency Department complaining of constant chest pain that began around 10:30am this morning.  She is a smoker, has hypertension and diabetes mellitus.  Patient reports some tightness in her chest last night that lasted for around 15 minutes and then went away. She states that it felt like someone was standing on her chest. She reports having some nausea while ambulating. Patient also reports SOB and diaphoresis. Patient woke up early this morning around 5 am with no pain. She then went back to bed and was awoken by more pain in her chest. She reports some associated tingliness in her left arm at that time. Patient currently takes NTG.  She had been taking renexa in 2016 but is not on it now.  She also reports that she has chest pain with taking a deep breath.  She has been coughing but associated with chronic smoker's cough.  She denies hemoptysis.  Patient denies taking Lasix. She denies hx of cancer and PE. She denies any leg swelling. She denies any recent travel history. She also denies taking birth control. Her mother and father both had an MI in the past. Her chest pain has almost resolved.  She had some subtle EKG findings on her EKG and a Heart Score of 5. She is being admitted for observation and cardiology consultation.  There had been some discussion of her having a cath in 2016 with her cardiologist but she was lost to follow up.   She also reports that she had to stop taking her beta blocker toprol XL in March 2017 due to not having medical insurance.  She had been treated for her chronic palpitations.  She still does  have palpitations but symptoms not related to current experience of chest pain.  She denies SOB.  No swelling in legs.    Review of Systems: All systems reviewed and apart from history of presenting illness, are negative.  Past Medical History:  Diagnosis Date  . Anxiety   . Chest pain    normal stress echo 05/2007  . Chronic low back pain   . Closed head injury    memory disturbance  . COPD (chronic obstructive pulmonary disease) ([place])   . Crohn's disease ([place])   . Diabetes mellitus without complication ([place])   . GERD (gastroesophageal reflux disease)    EGD 01/2007 by Dr.Rourke small hiatal hernia s/p 65 french maloney   . Hyperglycemia    fasting  . Hypertension   . IBS (irritable bowel syndrome)    chronic constipation h/o colon polyps in 2002 at [facility] per patient   . Palpitation    with tachycardia  . PONV (postoperative nausea and vomiting)   . PTSD (post-traumatic stress disorder)   . Tobacco abuse    25 yrs pack history 1/2 pack daily   Past Surgical History:  Procedure Laterality Date  . ABDOMINAL HYSTERECTOMY     with right salpingo oophorectomy 2005  . APPENDECTOMY  2006  . CESAREAN SECTION     1996  . CHOLECYSTECTOMY    . COLONOSCOPY  2010   Dr. Gala Romney; negative except for hemorrhoids  .  ESOPHAGEAL DILATION N/A 10/25/2014   Procedure: ESOPHAGEAL DILATION;  Surgeon: Rogene Houston, MD;  Location[facility];  Service: Endoscopy;  Laterality: N/A;  . ESOPHAGOGASTRODUODENOSCOPY N/A 10/25/2014   Procedure: ESOPHAGOGASTRODUODENOSCOPY (EGD);  Surgeon: Rogene Houston, MD;  Location: [facility];  Service: Endoscopy;  Laterality: N/A;  1250  . OOPHORECTOMY     left for torsion and ovarian fibroma; uterine myoma resected 1995   Social History:  reports that she has been smoking Cigarettes.  She started smoking about 31 years ago. She has a 10.00 pack-year smoking history. She has never used smokeless tobacco. She reports that she does not drink alcohol or use  drugs.   Allergies  Allergen Reactions  . Codeine Shortness Of Breath, Swelling and Rash    Throat swelling  . Penicillins Shortness Of Breath, Swelling and Other (See Comments)    Throat swells   . Acetaminophen     Per MD patient states she can't take Tylenol because of liver enzymes  . Benadryl [Diphenhydramine Hcl]   . Dilaudid [Hydromorphone Hcl]   . Hydrocodone   . Morphine And Related   . Oxycodone     Patient states ALL pain medications make her deathly sick.  . Strawberry Extract Swelling  . Watermelon Concentrate Swelling  . Latex Rash    Family History  Problem Relation Age of Onset  . Cancer Mother   . Heart failure Mother   . Hypertension Mother   . Heart attack Mother   . Cancer Father   . Heart failure Father   . Diabetes Father   . Heart attack Father 72     Prior to Admission medications   Medication Sig Start Date End Date Taking? Authorizing Provider  albuterol (PROVENTIL HFA;VENTOLIN HFA) 108 (90 Base) MCG/ACT inhaler Inhale 2 puffs into the lungs every 6 (six) hours as needed for wheezing or shortness of breath.   Yes Historical Provider, MD  ibuprofen (ADVIL,MOTRIN) 200 MG tablet Take 600 mg by mouth every 6 (six) hours as needed.   Yes Historical Provider, MD  OVER THE COUNTER MEDICATION Take 1 tablet by mouth as needed. Acid reducer she gets from [employer]   Yes Historical Provider, MD  SPIRIVA RESPIMAT 2.5 MCG/ACT AERS 2 (two) times daily.  07/27/14  Yes Historical Provider, MD  nitroGLYCERIN (NITROSTAT) 0.4 MG SL tablet Place 1 tablet (0.4 mg total) under the tongue every 5 (five) minutes as needed for chest pain. 04/15/15   Herminio Commons, MD   Physical Exam: Vitals:   05/09/16 1046 05/09/16 1100 05/09/16 1200 05/09/16 1246  BP: 154/84 110/65 119/79 107/77  Pulse: 100 96 82 83  Resp: 22 24 14 18   Temp: 98.4 F (36.9 C)     TempSrc: Oral     SpO2: 100% 100% 98% 99%  Weight:      Height:         General exam: Moderately built  and nourished patient, lying comfortably supine on the gurney in no obvious distress.  Head, eyes and ENT: Nontraumatic and normocephalic. Pupils equally reacting to light and accommodation. Oral mucosa moist.  Neck: Supple. No JVD, carotid bruit or thyromegaly.  Lymphatics: No lymphadenopathy.  Respiratory system: Clear to auscultation. No increased work of breathing. No wheezes heard.   Cardiovascular system: S1 and S2 heard. No JVD, murmurs, gallops, clicks or pedal edema.  Gastrointestinal system: Abdomen is nondistended, soft and nontender. Normal bowel sounds heard. No organomegaly or masses appreciated.  Central nervous  system: Alert and oriented. No focal neurological deficits.  Extremities: Symmetric 5 x 5 power. Peripheral pulses symmetrically felt.   Skin: No rashes or acute findings.  Musculoskeletal system: Negative exam.  Psychiatry: Pleasant and cooperative.   Labs on Admission:  Basic Metabolic Panel:  Recent Labs Lab 05/09/16 1052  NA 138  K 3.6  CL 105  CO2 25  GLUCOSE 124*  BUN 8  CREATININE 0.80  CALCIUM 9.4   Liver Function Tests: No results for input(s): AST, ALT, ALKPHOS, BILITOT, PROT, ALBUMIN in the last 168 hours. No results for input(s): LIPASE, AMYLASE in the last 168 hours. No results for input(s): AMMONIA in the last 168 hours. CBC:  Recent Labs Lab 05/09/16 1052  WBC 7.0  NEUTROABS 4.6  HGB 13.4  HCT 40.2  MCV 90.7  PLT 303   Cardiac Enzymes:  Recent Labs Lab 05/09/16 1052  TROPONINI <0.03    BNP (last 3 results) No results for input(s): PROBNP in the last 8760 hours. CBG: No results for input(s): GLUCAP in the last 168 hours.  Radiological Exams on Admission: Dg Chest 2 View  Result Date: 05/09/2016 CLINICAL DATA:  Chest pain and shortness of breath today. EXAM: CHEST  2 VIEW COMPARISON:  PA and lateral chest 01/29/2016. FINDINGS: Lungs are clear. Heart size is normal. No pneumothorax or pleural effusion. No bony  abnormality. IMPRESSION: Negative chest. Electronically Signed   By: Inge Rise M.D.   On: 05/09/2016 11:09   EKG: Independently reviewed.   Assessment/Plan Principal Problem:   Chest pain Active Problems:   TOBACCO ABUSE   GERD (gastroesophageal reflux disease)   Chest pain at rest  1. Typical Chest Pain symptoms - Pt has heart score of at least 5, will admit for observation, cycle troponin I, check lipid panel, monitor on telemetry, repeat EKG for recurrent chest pain, consult [facility] cardiology service, NPO after midnight, would defer further testing to the cardiology team in the morning.  Continue aspirin and nitroglycerin, Pt reports serious allergy to morphine and all pain medications.  First set of troponin has been negative.   2. GERD - pt only using OTC anti-acids from the [employer].  Ordered protonix BID, GI cocktail as needed.  Follow symptoms.  3. Abnormal EKG - subtle ST depression noted, continuous telemetry monitoring ordered, repeat EKG in AM, cardiology consulted.  4. Pleuritic chest pain - Pt has been coughing and smoking. Will  Check D dimer and check CTA if positive.  Pt does not have swollen legs or pain in legs.   5. Palpitations - Pt has been off her toprol XL since 3/17.  Further consideration per cardiology service.   6. COPD - pt has been off her regular meds due to lack of insurance coverage.  Resume daily spiriva and nebulizer treatments.  7. Insomnia - zolpidem ordered as needed.   8. Tobacco - strongly advised against tobacco use.    DVT Prophylaxis: heparin Code Status: full  Family Communication: bedside  Disposition Plan: home    Time spent: 62 mins  Irwin Brakeman, MD [facility] Pager [phone]  If 7PM-7AM, please contact night-coverage [email or web address] Password [email or web address] 05/09/2016, 1:24 PM

## 2016-05-09 NOTE — ED Triage Notes (Signed)
Pt reports woke up 1 1/2 hours ago with tightness in chest radiating to left arm, sob, and nausea.

## 2016-05-09 NOTE — ED Notes (Signed)
Report given to Ander Purpura, RN on 300

## 2016-05-09 NOTE — ED Notes (Signed)
ED Provider at bedside. 

## 2016-05-10 ENCOUNTER — Observation Stay (HOSPITAL_BASED_OUTPATIENT_CLINIC_OR_DEPARTMENT_OTHER): Payer: Self-pay

## 2016-05-10 ENCOUNTER — Encounter (HOSPITAL_COMMUNITY): Payer: Self-pay

## 2016-05-10 DIAGNOSIS — R002 Palpitations: Secondary | ICD-10-CM

## 2016-05-10 DIAGNOSIS — R079 Chest pain, unspecified: Secondary | ICD-10-CM

## 2016-05-10 DIAGNOSIS — R0609 Other forms of dyspnea: Secondary | ICD-10-CM

## 2016-05-10 DIAGNOSIS — E119 Type 2 diabetes mellitus without complications: Secondary | ICD-10-CM

## 2016-05-10 DIAGNOSIS — Z720 Tobacco use: Secondary | ICD-10-CM

## 2016-05-10 LAB — NM MYOCAR MULTI W/SPECT W/WALL MOTION / EF
CHL CUP MPHR: 173 {beats}/min
CHL CUP RESTING HR STRESS: 73 {beats}/min
CHL RATE OF PERCEIVED EXERTION: 15
CSEPEDS: 22 s
CSEPHR: 88 %
Estimated workload: 10.1 METS
Exercise duration (min): 8 min
LVDIAVOL: 50 mL (ref 46–106)
LVSYSVOL: 10 mL
Peak HR: 153 {beats}/min
RATE: 0.31
SDS: 3
SRS: 1
SSS: 4
TID: 1.23

## 2016-05-10 LAB — LIPID PANEL
CHOL/HDL RATIO: 3 ratio
CHOLESTEROL: 152 mg/dL (ref 0–200)
HDL: 50 mg/dL (ref >40)
LDL Cholesterol: 84 mg/dL (ref 0–99)
Triglycerides: 90 mg/dL (ref <150)
VLDL: 18 mg/dL (ref 0–40)

## 2016-05-10 LAB — TROPONIN I

## 2016-05-10 LAB — GLUCOSE, CAPILLARY
GLUCOSE-CAPILLARY: 92 mg/dL (ref 65–99)
Glucose-Capillary: 113 mg/dL — ABNORMAL HIGH (ref 65–99)

## 2016-05-10 LAB — HEMOGLOBIN A1C
HEMOGLOBIN A1C: 5.4 % (ref 4.8–5.6)
MEAN PLASMA GLUCOSE: 108 mg/dL

## 2016-05-10 MED ORDER — REGADENOSON 0.4 MG/5ML IV SOLN
INTRAVENOUS | Status: AC
  Filled 2016-05-10: qty 5

## 2016-05-10 MED ORDER — TECHNETIUM TC 99M TETROFOSMIN IV KIT
10.0000 | PACK | Freq: Once | INTRAVENOUS | Status: AC | PRN
  Administered 2016-05-10: 10.5 via INTRAVENOUS

## 2016-05-10 MED ORDER — ASPIRIN EC 81 MG PO TBEC: 81.0000 mg | DELAYED_RELEASE_TABLET | Freq: Every day | ORAL | Status: DC

## 2016-05-10 MED ORDER — TRAZODONE HCL 50 MG PO TABS: 50.0000 mg | ORAL_TABLET | Freq: Every evening | ORAL | 0 refills | Status: DC | PRN

## 2016-05-10 MED ORDER — TECHNETIUM TC 99M TETROFOSMIN IV KIT
30.0000 | PACK | Freq: Once | INTRAVENOUS | Status: AC | PRN
  Administered 2016-05-10: 30 via INTRAVENOUS

## 2016-05-10 MED ORDER — SODIUM CHLORIDE 0.9% FLUSH
INTRAVENOUS | Status: AC
  Administered 2016-05-10: 10 mL via INTRAVENOUS
  Filled 2016-05-10: qty 10

## 2016-05-10 MED ORDER — RANOLAZINE ER 500 MG PO TB12: 500.0000 mg | ORAL_TABLET | Freq: Two times a day (BID) | ORAL | 0 refills | Status: DC

## 2016-05-10 NOTE — Progress Notes (Signed)
Pt. Bleeding from left ear. Assessed with a Q-tip, not excessive but blood is bright red and present. Put vaseline on Q-tip and placed in ear. All vitals WNL. MD notified; will continue to monitor.

## 2016-05-10 NOTE — Progress Notes (Signed)
IV Removed, WNL. D/C instructions given to pt. Verbalized understanding. Pt awaiting ride home.

## 2016-05-10 NOTE — Progress Notes (Signed)
Pt being transported down by transport team for a Stress Test.

## 2016-05-10 NOTE — Discharge Instructions (Signed)
Chest Wall Pain Introduction Chest wall pain is pain in or around the bones and muscles of your chest. Sometimes, an injury causes this pain. Sometimes, the cause may not be known. This pain may take several weeks or longer to get better. Follow these instructions at home: Pay attention to any changes in your symptoms. Take these actions to help with your pain:  Rest as told by your doctor.  Avoid activities that cause pain. Try not to use your chest, belly (abdominal), or side muscles to lift heavy things.  If directed, apply ice to the painful area:  Put ice in a plastic bag.  Place a towel between your skin and the bag.  Leave the ice on for 20 minutes, 2-3 times per day.  Take over-the-counter and prescription medicines only as told by your doctor.  Do not use tobacco products, including cigarettes, chewing tobacco, and e-cigarettes. If you need help quitting, ask your doctor.  Keep all follow-up visits as told by your doctor. This is important. Contact a doctor if:  You have a fever.  Your chest pain gets worse.  You have new symptoms. Get help right away if:  You feel sick to your stomach (nauseous) or you throw up (vomit).  You feel sweaty or light-headed.  You have a cough with phlegm (sputum) or you cough up blood.  You are short of breath. This information is not intended to replace advice given to you by your health care provider. Make sure you discuss any questions you have with your health care provider. Document Released: 10/06/2007 Document Revised: 09/25/2015 Document Reviewed: 07/15/2014  2017 Elsevier   Chest Wall Pain Chest wall pain is pain in or around the bones and muscles of your chest. Sometimes, an injury causes this pain. Sometimes, the cause may not be known. This pain may take several weeks or longer to get better. Follow these instructions at home: Pay attention to any changes in your symptoms. Take these actions to help with your  pain:  Rest as told by your health care provider.  Avoid activities that cause pain. These include any activities that use your chest muscles or your abdominal and side muscles to lift heavy items.  If directed, apply ice to the painful area:  Put ice in a plastic bag.  Place a towel between your skin and the bag.  Leave the ice on for 20 minutes, 2-3 times per day.  Take over-the-counter and prescription medicines only as told by your health care provider.  Do not use tobacco products, including cigarettes, chewing tobacco, and e-cigarettes. If you need help quitting, ask your health care provider.  Keep all follow-up visits as told by your health care provider. This is important. Contact a health care provider if:  You have a fever.  Your chest pain becomes worse.  You have new symptoms. Get help right away if:  You have nausea or vomiting.  You feel sweaty or light-headed.  You have a cough with phlegm (sputum) or you cough up blood.  You develop shortness of breath. This information is not intended to replace advice given to you by your health care provider. Make sure you discuss any questions you have with your health care provider. Document Released: 04/19/2005 Document Revised: 08/28/2015 Document Reviewed: 07/15/2014 Elsevier Interactive Patient Education  2017 [employer].   Steps to Quit Smoking Smoking tobacco can be bad for your health. It can also affect almost every organ in your body. Smoking puts you and  people around you at risk for many serious long-lasting (chronic) diseases. Quitting smoking is hard, but it is one of the best things that you can do for your health. It is never too late to quit. What are the benefits of quitting smoking? When you quit smoking, you lower your risk for getting serious diseases and conditions. They can include:  Lung cancer or lung disease.  Heart disease.  Stroke.  Heart attack.  Not being able to have children  (infertility).  Weak bones (osteoporosis) and broken bones (fractures). If you have coughing, wheezing, and shortness of breath, those symptoms may get better when you quit. You may also get sick less often. If you are pregnant, quitting smoking can help to lower your chances of having a baby of low birth weight. What can I do to help me quit smoking? Talk with your doctor about what can help you quit smoking. Some things you can do (strategies) include:  Quitting smoking totally, instead of slowly cutting back how much you smoke over a period of time.  Going to in-person counseling. You are more likely to quit if you go to many counseling sessions.  Using resources and support systems, such as:  Online chats with a Social worker.  Phone quitlines.  Printed Furniture conservator/restorer.  Support groups or group counseling.  Text messaging programs.  Mobile phone apps or applications.  Taking medicines. Some of these medicines may have nicotine in them. If you are pregnant or breastfeeding, do not take any medicines to quit smoking unless your doctor says it is okay. Talk with your doctor about counseling or other things that can help you. Talk with your doctor about using more than one strategy at the same time, such as taking medicines while you are also going to in-person counseling. This can help make quitting easier. What things can I do to make it easier to quit? Quitting smoking might feel very hard at first, but there is a lot that you can do to make it easier. Take these steps:  Talk to your family and friends. Ask them to support and encourage you.  Call phone quitlines, reach out to support groups, or work with a Social worker.  Ask people who smoke to not smoke around you.  Avoid places that make you want (trigger) to smoke, such as:  Bars.  Parties.  Smoke-break areas at work.  Spend time with people who do not smoke.  Lower the stress in your life. Stress can make you want  to smoke. Try these things to help your stress:  Getting regular exercise.  Deep-breathing exercises.  Yoga.  Meditating.  Doing a body scan. To do this, close your eyes, focus on one area of your body at a time from head to toe, and notice which parts of your body are tense. Try to relax the muscles in those areas.  Download or buy apps on your mobile phone or tablet that can help you stick to your quit plan. There are many free apps, such as QuitGuide from the [employer] Office manager for Disease Control and Prevention). You can find more support from smokefree.gov and other websites. This information is not intended to replace advice given to you by your health care provider. Make sure you discuss any questions you have with your health care provider. Document Released: 02/13/2009 Document Revised: 12/16/2015 Document Reviewed: 09/03/2014 Elsevier Interactive Patient Education  2017 [employer].
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
## 2016-05-10 NOTE — Discharge Summary (Signed)
Physician Discharge Summary  Marlow Hendrie OTR:[id number] DOB: 10-18-1970 DOA: 05/09/2016  PCP: Wende Neighbors, MD  Admit date: 05/09/2016 Discharge date: 05/10/2016  Admitted From: Home  Disposition:  Home   Recommendations for Outpatient Follow-up:  1. Follow up with PCP in 1 weeks  Discharge Condition: STABLE  CODE STATUS: FULL  Diet recommendation: Heart Healthy    Brief/Interim Summary: HPI: [name] is a 46 y.o. female with long history of chest pain had been followed by cardiology in 2016 and lost to follow up who presents to the Emergency Department complaining of constant chest pain that began around 10:30am this morning. She is a smoker, has hypertension and diabetes mellitus.  Patient reports some tightness in her chest last night that lasted for around 15 minutes and then went away. She states that it felt like someone was standing on her chest. She reports having some nausea while ambulating. Patient also reports SOB and diaphoresis. Patient woke up early this morning around 5 am with no pain. She then went back to bed and was awoken by more pain in her chest. She reports some associated tingliness in her left arm at that time. Patient currently takes NTG.  She had been taking renexa in 2016 but is not on it now.  She also reports that she has chest pain with taking a deep breath.  She has been coughing but associated with chronic smoker's cough.  She denies hemoptysis.  Patient denies taking Lasix. She denies hx of cancer and PE. She denies any leg swelling. She denies any recent travel history. She also denies taking birth control. Her mother and father both had an MI in the past. Her chest pain has almost resolved.  She had some subtle EKG findings on her EKG and a Heart Score of 5. She is being admitted for observation and cardiology consultation.  There had been some discussion of her having a cath in 2016 with her cardiologist but she was lost to follow up.   She also reports that she  had to stop taking her beta blocker toprol XL in March 2017 due to not having medical insurance.  She had been treated for her chronic palpitations.  She still does have palpitations but symptoms not related to current experience of chest pain.  She denies SOB.  No swelling in legs.    1. Typical Chest Pain symptoms - had 3 negative troponins. Pt was seen by cardiology and had a low risk stress test done.  They felt that it was safe for her to discharge home.  They recommended aspirin 81 mg daily and ranexa 500 mg BID. Rx given at discharge.  Follow up recommended to patient who verbalized understanding.    2. GERD - pt only using OTC anti-acids from the [employer].  Ordered protonix BID, GI cocktail as needed.    3. Abnormal EKG - subtle ST depression noted, continuous telemetry monitoring ordered, repeat EKG in AM, cardiology consulted.  4. Pleuritic chest pain - Pt has been coughing and smoking.  D dimer negative.  Pt does not have swollen legs or pain in legs.   5. Palpitations - Pt has been off her toprol XL since 3/17.  Further consideration per cardiology service.   6. COPD - pt has been off her regular meds due to lack of insurance coverage.  Resume daily spiriva and nebulizer treatments.  7. Insomnia - Pt to discharge home with trazodone prn (#15).  Follow up with PCP.   8.  Tobacco - strongly advised against tobacco use. Education given and counseling. Written materials given.    DVT Prophylaxis: heparin Code Status: full  Family Communication: bedside  Disposition Plan: home    Discharge Diagnoses:  Principal Problem:   Chest pain at rest Active Problems:   Pleuritic chest pain   GERD (gastroesophageal reflux disease)   COPD (chronic obstructive pulmonary disease) (HCC)   Tobacco abuse   TOBACCO ABUSE   Palpitations   Diabetes mellitus without complication [facility])  Discharge Instructions  Discharge Instructions    Diet - low sodium heart healthy    Complete by:  As  directed    Increase activity slowly    Complete by:  As directed      Allergies as of 05/10/2016      Reactions   Codeine Shortness Of Breath, Swelling, Rash   Throat swelling   Penicillins Shortness Of Breath, Swelling, Other (See Comments)   Throat swells   Acetaminophen    Per MD patient states she can't take Tylenol because of liver enzymes   Benadryl [diphenhydramine Hcl]    Dilaudid [hydromorphone Hcl]    Hydrocodone    Morphine And Related    Oxycodone    Patient states ALL pain medications make her deathly sick.   Strawberry Extract Swelling   Watermelon Concentrate Swelling   Latex Rash      Medication List    TAKE these medications   albuterol 108 (90 Base) MCG/ACT inhaler Commonly known as:  PROVENTIL HFA;VENTOLIN HFA Inhale 2 puffs into the lungs every 6 (six) hours as needed for wheezing or shortness of breath.   aspirin EC 81 MG tablet Take 1 tablet (81 mg total) by mouth daily.   ibuprofen 200 MG tablet Commonly known as:  ADVIL,MOTRIN Take 600 mg by mouth every 6 (six) hours as needed.   nitroGLYCERIN 0.4 MG SL tablet Commonly known as:  NITROSTAT Place 1 tablet (0.4 mg total) under the tongue every 5 (five) minutes as needed for chest pain.   OVER THE COUNTER MEDICATION Take 1 tablet by mouth as needed. Acid reducer she gets from [employer]   ranolazine 500 MG 12 hr tablet Commonly known as:  RANEXA Take 1 tablet (500 mg total) by mouth 2 (two) times daily.   SPIRIVA RESPIMAT 2.5 MCG/ACT Aers Generic drug:  Tiotropium Bromide Monohydrate 2 (two) times daily.   traZODone 50 MG tablet Commonly known as:  DESYREL Take 1 tablet (50 mg total) by mouth at bedtime as needed for sleep.      Follow-up Information    Wende Neighbors, MD. Schedule an appointment as soon as possible for a visit in 1 week(s).   Specialty:  Internal Medicine Why:  Hospital Follow Up  Contact information: [place] [id number] [phone]         Kate Sable, MD. Schedule an appointment as soon as possible for a visit in 1 month(s).   Specialty:  Cardiology Why:  Hospital Follow Up  Contact information: [place] [address] [phone]          Allergies  Allergen Reactions  . Codeine Shortness Of Breath, Swelling and Rash    Throat swelling  . Penicillins Shortness Of Breath, Swelling and Other (See Comments)    Throat swells   . Acetaminophen     Per MD patient states she can't take Tylenol because of liver enzymes  . Benadryl [Diphenhydramine Hcl]   . Dilaudid [  Hydromorphone Hcl]   . Hydrocodone   . Morphine And Related   . Oxycodone     Patient states ALL pain medications make her deathly sick.  . Strawberry Extract Swelling  . Watermelon Concentrate Swelling  . Latex Rash   Procedures/Studies: Dg Chest 2 View  Result Date: 05/09/2016 CLINICAL DATA:  Chest pain and shortness of breath today. EXAM: CHEST  2 VIEW COMPARISON:  PA and lateral chest 01/29/2016. FINDINGS: Lungs are clear. Heart size is normal. No pneumothorax or pleural effusion. No bony abnormality. IMPRESSION: Negative chest. Electronically Signed   By: Inge Rise M.D.   On: 05/09/2016 11:09   Nm Myocar Multi W/spect W/wall Motion / Ef  Result Date: 05/10/2016  No diagnostic ST segment changes. Low risk Duke treadmill score of 8.5.  Blood pressure demonstrated a hypertensive response to exercise.  Small, mild intensity, mid anteroseptal defect that is partially reversible. Suggestive of variable soft tissue attenuation, cannot rule out small ischemic zone.  This is a low risk study.  Nuclear stress EF: 80%.    (Echo, Carotid, EGD, Colonoscopy, ERCP)   Vitals:   05/10/16 0843 05/10/16 1048  BP: 123/76 110/62  Pulse: 81 81  Resp: 18   Temp:     Vitals:   05/09/16 2102 05/10/16 0507 05/10/16 0843 05/10/16 1048  BP: 124/75 113/61 123/76 110/62  Pulse: 74 77 81 81  Resp: 20 16 18    Temp: 97.9 F (36.6 C)  98.3 F (36.8 C)    TempSrc: Oral Oral    SpO2: 98% 98% 97% 96%  Weight:      Height:        The results of significant diagnostics from this hospitalization (including imaging, microbiology, ancillary and laboratory) are listed below for reference.     Microbiology: No results found for this or any previous visit (from the past 240 hour(s)).   Labs: BNP (last 3 results) No results for input(s): BNP in the last 8760 hours. Basic Metabolic Panel:  Recent Labs Lab 05/09/16 1052  NA 138  K 3.6  CL 105  CO2 25  GLUCOSE 124*  BUN 8  CREATININE 0.80  CALCIUM 9.4   Liver Function Tests: No results for input(s): AST, ALT, ALKPHOS, BILITOT, PROT, ALBUMIN in the last 168 hours. No results for input(s): LIPASE, AMYLASE in the last 168 hours. No results for input(s): AMMONIA in the last 168 hours. CBC:  Recent Labs Lab 05/09/16 1052  WBC 7.0  NEUTROABS 4.6  HGB 13.4  HCT 40.2  MCV 90.7  PLT 303   Cardiac Enzymes:  Recent Labs Lab 05/09/16 1052 05/09/16 1624 05/09/16 2338  TROPONINI <0.03 <0.03 <0.03   BNP: Invalid input(s): POCBNP CBG:  Recent Labs Lab 05/09/16 1607 05/09/16 2059 05/10/16 0810 05/10/16 1117  GLUCAP 111* 117* 92 113*   D-Dimer  Recent Labs  05/09/16 1052  DDIMER 0.43   Hgb A1c  Recent Labs  05/09/16 1052  HGBA1C 5.4   Lipid Profile  Recent Labs  05/10/16 0519  CHOL 152  HDL 50  LDLCALC 84  TRIG 90  CHOLHDL 3.0   Thyroid function studies  Recent Labs  05/09/16 1624  TSH 0.716   Anemia work up No results for input(s): VITAMINB12, FOLATE, FERRITIN, TIBC, IRON, RETICCTPCT in the last 72 hours. Urinalysis    Component Value Date/Time   COLORURINE YELLOW 09/14/2015 1231   APPEARANCEUR CLEAR 09/14/2015 1231   LABSPEC >1.030 (H) 09/14/2015 1231   PHURINE 6.0 09/14/2015 1231  GLUCOSEU NEGATIVE 09/14/2015 1231   GLUCOSEU NEG mg/dL 12/24/2009 2317   HGBUR MODERATE (A) 09/14/2015 1231   BILIRUBINUR NEGATIVE  09/14/2015 1231   KETONESUR 40 (A) 09/14/2015 1231   PROTEINUR >300 (A) 09/14/2015 1231   UROBILINOGEN 0.2 12/26/2013 1530   NITRITE POSITIVE (A) 09/14/2015 1231   LEUKOCYTESUR TRACE (A) 09/14/2015 1231   Sepsis Labs Invalid input(s): PROCALCITONIN,  WBC,  LACTICIDVEN Microbiology No results found for this or any previous visit (from the past 240 hour(s)).  Time coordinating discharge: 26 minutes  SIGNED:  Irwin Brakeman, MD  [facility] 05/10/2016, 3:47 PM Pager   If 7PM-7AM, please contact night-coverage [email or web address] Password [email or web address]

## 2016-05-18 ENCOUNTER — Telehealth: Payer: Self-pay | Admitting: Cardiovascular Disease

## 2016-05-18 ENCOUNTER — Encounter: Payer: Self-pay | Admitting: Adult Health

## 2016-05-18 ENCOUNTER — Emergency Department (HOSPITAL_COMMUNITY)
Admission: EM | Admit: 2016-05-18 | Discharge: 2016-05-18 | Disposition: A | Discharge status: Home / Self Care | Payer: Self-pay | Attending: Emergency Medicine | Admitting: Emergency Medicine

## 2016-05-18 ENCOUNTER — Encounter (HOSPITAL_COMMUNITY): Payer: Self-pay | Admitting: Cardiology

## 2016-05-18 ENCOUNTER — Ambulatory Visit (INDEPENDENT_AMBULATORY_CARE_PROVIDER_SITE_OTHER): Payer: Self-pay | Admitting: Adult Health

## 2016-05-18 VITALS — BP 130/80 | HR 119 | Ht 66.0 in | Wt 149.0 lb

## 2016-05-18 DIAGNOSIS — F419 Anxiety disorder, unspecified: Secondary | ICD-10-CM

## 2016-05-18 DIAGNOSIS — E119 Type 2 diabetes mellitus without complications: Secondary | ICD-10-CM | POA: Insufficient documentation

## 2016-05-18 DIAGNOSIS — Z79899 Other long term (current) drug therapy: Secondary | ICD-10-CM | POA: Insufficient documentation

## 2016-05-18 DIAGNOSIS — F322 Major depressive disorder, single episode, severe without psychotic features: Secondary | ICD-10-CM

## 2016-05-18 DIAGNOSIS — F32A Depression, unspecified: Secondary | ICD-10-CM

## 2016-05-18 DIAGNOSIS — J45909 Unspecified asthma, uncomplicated: Secondary | ICD-10-CM | POA: Insufficient documentation

## 2016-05-18 DIAGNOSIS — F329 Major depressive disorder, single episode, unspecified: Secondary | ICD-10-CM

## 2016-05-18 DIAGNOSIS — I110 Hypertensive heart disease with heart failure: Secondary | ICD-10-CM | POA: Insufficient documentation

## 2016-05-18 DIAGNOSIS — J449 Chronic obstructive pulmonary disease, unspecified: Secondary | ICD-10-CM | POA: Insufficient documentation

## 2016-05-18 DIAGNOSIS — F418 Other specified anxiety disorders: Secondary | ICD-10-CM | POA: Insufficient documentation

## 2016-05-18 DIAGNOSIS — Z91040 Latex allergy status: Secondary | ICD-10-CM | POA: Insufficient documentation

## 2016-05-18 DIAGNOSIS — R0789 Other chest pain: Secondary | ICD-10-CM

## 2016-05-18 DIAGNOSIS — F17210 Nicotine dependence, cigarettes, uncomplicated: Secondary | ICD-10-CM | POA: Insufficient documentation

## 2016-05-18 DIAGNOSIS — I509 Heart failure, unspecified: Secondary | ICD-10-CM | POA: Insufficient documentation

## 2016-05-18 DIAGNOSIS — Z7982 Long term (current) use of aspirin: Secondary | ICD-10-CM | POA: Insufficient documentation

## 2016-05-18 LAB — COMPREHENSIVE METABOLIC PANEL
ALT: 45 U/L (ref 14–54)
ANION GAP: 8 (ref 5–15)
AST: 35 U/L (ref 15–41)
Albumin: 4.5 g/dL (ref 3.5–5.0)
Alkaline Phosphatase: 92 U/L (ref 38–126)
BILIRUBIN TOTAL: 0.3 mg/dL (ref 0.3–1.2)
BUN: 14 mg/dL (ref 6–20)
CO2: 29 mmol/L (ref 22–32)
Calcium: 10 mg/dL (ref 8.9–10.3)
Chloride: 100 mmol/L — ABNORMAL LOW (ref 101–111)
Creatinine, Ser: 0.85 mg/dL (ref 0.44–1.00)
GFR calc Af Amer: >60 mL/min (ref >60)
Glucose, Bld: 115 mg/dL — ABNORMAL HIGH (ref 65–99)
POTASSIUM: 3.6 mmol/L (ref 3.5–5.1)
Sodium: 137 mmol/L (ref 135–145)
TOTAL PROTEIN: 8.1 g/dL (ref 6.5–8.1)

## 2016-05-18 LAB — CBC
HEMATOCRIT: 44.1 % (ref 36.0–46.0)
Hemoglobin: 15 g/dL (ref 12.0–15.0)
MCH: 30.4 pg (ref 26.0–34.0)
MCHC: 34 g/dL (ref 30.0–36.0)
MCV: 89.5 fL (ref 78.0–100.0)
Platelets: 397 10*3/uL (ref 150–400)
RBC: 4.93 MIL/uL (ref 3.87–5.11)
RDW: 13 % (ref 11.5–15.5)
WBC: 10 10*3/uL (ref 4.0–10.5)

## 2016-05-18 LAB — ETHANOL: Alcohol, Ethyl (B): <5 mg/dL (ref <5)

## 2016-05-18 MED ORDER — ALBUTEROL SULFATE HFA 108 (90 BASE) MCG/ACT IN AERS
2.0000 | INHALATION_SPRAY | Freq: Four times a day (QID) | RESPIRATORY_TRACT | Status: DC | PRN
  Filled 2016-05-18: qty 6.7

## 2016-05-18 MED ORDER — IBUPROFEN 400 MG PO TABS
400.0000 mg | ORAL_TABLET | Freq: Once | ORAL | Status: AC
  Administered 2016-05-18: 400 mg via ORAL
  Filled 2016-05-18: qty 1

## 2016-05-18 MED ORDER — LORAZEPAM 1 MG PO TABS: 1.0000 mg | ORAL_TABLET | Freq: Three times a day (TID) | ORAL | 0 refills | Status: DC | PRN

## 2016-05-18 MED ORDER — LORAZEPAM 1 MG PO TABS
1.0000 mg | ORAL_TABLET | Freq: Once | ORAL | Status: AC
  Administered 2016-05-18: 1 mg via ORAL
  Filled 2016-05-18: qty 1

## 2016-05-18 MED ORDER — RANOLAZINE ER 500 MG PO TB12
500.0000 mg | ORAL_TABLET | Freq: Two times a day (BID) | ORAL | Status: DC
  Filled 2016-05-18 (×2): qty 1

## 2016-05-18 MED ORDER — IBUPROFEN 800 MG PO TABS
800.0000 mg | ORAL_TABLET | Freq: Once | ORAL | Status: AC
  Administered 2016-05-18: 800 mg via ORAL
  Filled 2016-05-18: qty 1

## 2016-05-18 MED ORDER — TIOTROPIUM BROMIDE MONOHYDRATE 18 MCG IN CAPS
1.0000 | ORAL_CAPSULE | Freq: Every day | RESPIRATORY_TRACT | Status: DC
  Filled 2016-05-18: qty 5

## 2016-05-18 MED ORDER — TIOTROPIUM BROMIDE MONOHYDRATE 18 MCG IN CAPS
1.0000 | ORAL_CAPSULE | Freq: Two times a day (BID) | RESPIRATORY_TRACT | Status: DC
  Filled 2016-05-18: qty 5

## 2016-05-18 MED ORDER — NITROGLYCERIN 0.4 MG SL SUBL: 0.4000 mg | SUBLINGUAL_TABLET | SUBLINGUAL | Status: DC | PRN

## 2016-05-18 MED ORDER — ASPIRIN EC 81 MG PO TBEC
81.0000 mg | DELAYED_RELEASE_TABLET | Freq: Every day | ORAL | Status: DC
  Administered 2016-05-18: 81 mg via ORAL
  Filled 2016-05-18: qty 1
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
## 2016-05-18 NOTE — ED Triage Notes (Addendum)
Here from the cardiology office.  Pt c/o depression and anxiety.  Chest pain and headache times 1 week. Crying during triage.  Denies HI or SI.

## 2016-05-18 NOTE — Telephone Encounter (Signed)
Pt had 07/2015 apt with MD she cx, wants refills,apt made today with Arnold Long NP. Pt also wants to talk with Adline Potter , [facility], after visit

## 2016-05-18 NOTE — Telephone Encounter (Signed)
Please give pt a call concerning her Metoprolol, she's wanting a 90 day refill, but it's not on her med list
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
## 2016-05-18 NOTE — ED Notes (Signed)
Pt states she has had increased amount of stress. Her daughter is now living with her. Pt endorses that she is stressed and depressed. Denies any SI/HI or AVH.

## 2016-05-18 NOTE — Discharge Instructions (Signed)
As discussed, although tonight's evaluation has been generally reassuring, it is important that he follow up with her primary care physician and an outpatient mental health provider to ensure appropriate ongoing management of your condition.  Return here for concerning changes in your condition.

## 2016-05-18 NOTE — ED Provider Notes (Addendum)
[facility] DEPT Provider Note   CSN: [id number] Arrival date & time: 05/18/16  1534     History   Chief Complaint Chief Complaint  Patient presents with  . Depression    HPI [name] is a 46 y.o. female.  HPI  Patient presents with concern of feeling overwhelmed, despondent. She acknowledges history of multiple medical issues including chest pain over the past month. Notably, the patient was admitted one week ago, evaluated for chest pain, discharged in stable condition. Today the patient went to a outpatient cardiology follow-up appointment. There, she told staff that she had substantial depression, was despondent. She was referred here for evaluation. She does continue to complain of pain that is the same as she has experienced over the past month, in her left upper chest.  This is unchanged. However, she also claims of increasing despondency, since of frustration, depression. No explicit suicidal ideation. Patient has multiple life stress source, including having multiple family members moved back into her domicile, without assistance and bill paying.  Past Medical History:  Diagnosis Date  . Anxiety   . Asthma   . Chest pain    normal stress echo 05/2007  . CHF (congestive heart failure) ([place])   . Chronic low back pain   . Closed head injury    memory disturbance  . Collagen vascular disease ([place])   . COPD (chronic obstructive pulmonary disease) ([place])   . Crohn's disease ([place])   . Diabetes mellitus without complication ([place])   . GERD (gastroesophageal reflux disease)    EGD 01/2007 by Dr.Rourke small hiatal hernia s/p 20 french maloney   . Hyperglycemia    fasting  . Hypertension   . IBS (irritable bowel syndrome)    chronic constipation h/o colon polyps in 2002 at [facility] per patient   . Palpitation    with tachycardia  . PONV (postoperative nausea and vomiting)   . PTSD (post-traumatic stress disorder)   . Tobacco abuse    25 yrs pack history 1/2 pack  daily    Patient Active Problem List   Diagnosis Date Noted  . Chest pain at rest 05/09/2016  . COPD (chronic obstructive pulmonary disease) ([place])   . Crohn's disease ([place])   . Diabetes mellitus without complication ([place])   . Anxiety   . Tobacco abuse   . PTSD (post-traumatic stress disorder)   . Palpitation   . Unspecified constipation 09/20/2012  . Dyspnea 01/26/2012  . Paresthesia 11/25/2011  . Abdominal pain, chronic, epigastric 11/01/2011  . Abdominal pain, acute, periumbilical 78/29/5621  . GERD (gastroesophageal reflux disease)   . Hyperglycemia   . Chronic low back pain   . IBS (irritable bowel syndrome)   . Closed head injury   . TOBACCO ABUSE 07/13/2010  . Palpitations 07/13/2010  . Pleuritic chest pain 07/13/2010  . Anxiety state, unspecified 07/03/2008  . BACK PAIN, LUMBAR, CHRONIC 07/03/2008    Past Surgical History:  Procedure Laterality Date  . ABDOMINAL HYSTERECTOMY     with right salpingo oophorectomy 2005  . APPENDECTOMY  2006  . CESAREAN SECTION     1996  . CHOLECYSTECTOMY    . COLONOSCOPY  2010   Dr. Gala Romney; negative except for hemorrhoids  . ESOPHAGEAL DILATION N/A 10/25/2014   Procedure: ESOPHAGEAL DILATION;  Surgeon: Rogene Houston, MD;  Location: [facility];  Service: Endoscopy;  Laterality: N/A;  . ESOPHAGOGASTRODUODENOSCOPY N/A 10/25/2014   Procedure: ESOPHAGOGASTRODUODENOSCOPY (EGD);  Surgeon: Rogene Houston, MD;  Location: [facility] ENDO SUITE;  Service: Endoscopy;  Laterality: N/A;  1250  . OOPHORECTOMY     left for torsion and ovarian fibroma; uterine myoma resected 1995    OB History    Gravida Para Term Preterm AB Living   3 1 1   2      SAB TAB Ectopic Multiple Live Births   2               Home Medications    Prior to Admission medications   Medication Sig Start Date End Date Taking? Authorizing Provider  albuterol (PROVENTIL HFA;VENTOLIN HFA) 108 (90 Base) MCG/ACT inhaler Inhale 2 puffs into the lungs every 6 (six) hours as  needed for wheezing or shortness of breath.   Yes Historical Provider, MD  aspirin EC 81 MG tablet Take 1 tablet (81 mg total) by mouth daily. 05/10/16  Yes Clanford Marisa Hua, MD  nitroGLYCERIN (NITROSTAT) 0.4 MG SL tablet Place 1 tablet (0.4 mg total) under the tongue every 5 (five) minutes as needed for chest pain. 04/15/15  Yes Herminio Commons, MD  OVER THE COUNTER MEDICATION Take 1 tablet by mouth as needed. Acid reducer she gets from [employer]   Yes Historical Provider, MD  ranolazine (RANEXA) 500 MG 12 hr tablet Take 1 tablet (500 mg total) by mouth 2 (two) times daily. 05/10/16  Yes Clanford Marisa Hua, MD  SPIRIVA RESPIMAT 2.5 MCG/ACT AERS Inhale 1 puff into the lungs 2 (two) times daily.  07/27/14  Yes Historical Provider, MD  traZODone (DESYREL) 50 MG tablet Take 50 mg by mouth at bedtime.   Yes Historical Provider, MD    Family History Family History  Problem Relation Age of Onset  . Cancer Mother   . Heart failure Mother   . Hypertension Mother   . Heart attack Mother   . Cancer Father   . Heart failure Father   . Diabetes Father   . Heart attack Father 17    Social History Social History  Substance Use Topics  . Smoking status: Current Some Day Smoker    Packs/day: 0.50    Years: 20.00    Types: Cigarettes    Start date: 09/26/1984  . Smokeless tobacco: Never Used  . Alcohol use No     Allergies   Codeine; Penicillins; Acetaminophen; Benadryl [diphenhydramine hcl]; Dilaudid [hydromorphone hcl]; Hydrocodone; Morphine and related; Oxycodone; Strawberry extract; Watermelon concentrate; and Latex   Review of Systems Review of Systems  Constitutional:       Per HPI, otherwise negative  HENT:       Per HPI, otherwise negative  Respiratory:       Per HPI, otherwise negative  Cardiovascular:       Per HPI, otherwise negative  Gastrointestinal: Negative for vomiting.  Endocrine:       Negative aside from HPI  Genitourinary:       Neg aside from HPI     Musculoskeletal:       Per HPI, otherwise negative  Skin: Negative.   Neurological: Negative for syncope.  Psychiatric/Behavioral: Positive for dysphoric mood and sleep disturbance. The patient is nervous/anxious.      Physical Exam Updated Vital Signs BP 161/70 (BP Location: Left Arm)   Pulse 108   Temp 98.2 F (36.8 C) (Oral)   Resp 18   Ht 5' 6"  (1.676 m)   Wt 149 lb (67.6 kg)   SpO2 98%   BMI 24.05 kg/m   Physical Exam  Constitutional: She is oriented to person, place,  and time. She appears well-developed and well-nourished. No distress.  HENT:  Head: Normocephalic and atraumatic.  Eyes: Conjunctivae and EOM are normal.  Cardiovascular: Normal rate and regular rhythm.   Pulmonary/Chest: Effort normal and breath sounds normal. No stridor. No respiratory distress.  Abdominal: She exhibits no distension.  Musculoskeletal: She exhibits no edema.  Neurological: She is alert and oriented to person, place, and time. No cranial nerve deficit.  Skin: Skin is warm and dry.  Psychiatric: Cognition and memory are not impaired. She exhibits a depressed mood. She expresses no suicidal ideation. She expresses no suicidal plans.  Nursing note and vitals reviewed.    ED Treatments / Results  Labs (all labs ordered are listed, but only abnormal results are displayed) Labs Reviewed  COMPREHENSIVE METABOLIC PANEL - Abnormal; Notable for the following:       Result Value   Chloride 100 (*)    Glucose, Bld 115 (*)    All other components within normal limits  CBC  ETHANOL   Procedures Procedures (including critical care time)  Medications Ordered in ED Medications  ibuprofen (ADVIL,MOTRIN) tablet 800 mg (800 mg Oral Given 05/18/16 1607)  LORazepam (ATIVAN) tablet 1 mg (1 mg Oral Given 05/18/16 1607)     Initial Impression / Assessment and Plan / ED Course  I have reviewed the triage vital signs and the nursing notes.  Pertinent labs & imaging results that were available  during my care of the patient were reviewed by me and considered in my medical decision making (see chart for details).    Clinical Course     This patient presents with concern of depression, despondency, hopelessness. Patient does have ongoing pain, but no evidence for distress, she is hemodynamically stable, has been evaluated by cardiology, both inpatient and as an outpatient earlier today. Labs, vitals, reassuring, patient medically clear for psychiatric evaluation.  Final Clinical Impressions(s) / ED Diagnoses  Despondency   Carmin Muskrat, MD 05/18/16 1816  9:20 PM I discussed the patient's presentation with our behavioral health counseling team, indication for discharge. Subsequently I evaluated the patient again. She is awake and alert. She is amenable to outpatient follow-up, and short course of anxiolytics will be provided.     Carmin Muskrat, MD 05/18/16 2120

## 2016-05-18 NOTE — Progress Notes (Signed)
Name: [name]    DOB: 1971-01-20  Age: 46 y.o.  MR#: [id number]       PCP:  Wende Neighbors, MD      Insurance: Payor: MEDICAID POTENTIAL / Plan: MEDICAID POTENTIAL / Product Type: *No Product type* /   CC:   No chief complaint on file.   VS Vitals:   05/18/16 1500  BP: 130/80  Pulse: (!) 119  SpO2: 97%  Weight: 149 lb (67.6 kg)  Height: 5' 6"  (1.676 m)    Weights Current Weight  05/18/16 149 lb (67.6 kg)  05/09/16 140 lb (63.5 kg)  01/29/16 150 lb (68 kg)    Blood Pressure  BP Readings from Last 3 Encounters:  05/18/16 130/80  05/10/16 110/62  01/29/16 (!) 123/51     Admit date:  (Not on file) Last encounter with RMR:  Visit date not found   Allergy Codeine; Penicillins; Acetaminophen; Benadryl [diphenhydramine hcl]; Dilaudid [hydromorphone hcl]; Hydrocodone; Morphine and related; Oxycodone; Strawberry extract; Watermelon concentrate; and Latex  Current Outpatient Prescriptions  Medication Sig Dispense Refill  . albuterol (PROVENTIL HFA;VENTOLIN HFA) 108 (90 Base) MCG/ACT inhaler Inhale 2 puffs into the lungs every 6 (six) hours as needed for wheezing or shortness of breath.    Marland Kitchen aspirin EC 81 MG tablet Take 1 tablet (81 mg total) by mouth daily.    . nitroGLYCERIN (NITROSTAT) 0.4 MG SL tablet Place 1 tablet (0.4 mg total) under the tongue every 5 (five) minutes as needed for chest pain. 25 tablet 3  . OVER THE COUNTER MEDICATION Take 1 tablet by mouth as needed. Acid reducer she gets from [employer]    . ranolazine (RANEXA) 500 MG 12 hr tablet Take 1 tablet (500 mg total) by mouth 2 (two) times daily. 60 tablet 0  . SPIRIVA RESPIMAT 2.5 MCG/ACT AERS 2 (two) times daily.   0   No current facility-administered medications for this visit.     Discontinued Meds:    Medications Discontinued During This Encounter  Medication Reason  . ibuprofen (ADVIL,MOTRIN) 200 MG tablet Error  . traZODone (DESYREL) 50 MG tablet Error    Patient Active Problem List   Diagnosis  Date Noted  . Chest pain at rest 05/09/2016  . COPD (chronic obstructive pulmonary disease) ([place])   . Crohn's disease ([place])   . Diabetes mellitus without complication ([place])   . Anxiety   . Tobacco abuse   . PTSD (post-traumatic stress disorder)   . Palpitation   . Unspecified constipation 09/20/2012  . Dyspnea 01/26/2012  . Paresthesia 11/25/2011  . Abdominal pain, chronic, epigastric 11/01/2011  . Abdominal pain, acute, periumbilical 18/59/0931  . GERD (gastroesophageal reflux disease)   . Hyperglycemia   . Chronic low back pain   . IBS (irritable bowel syndrome)   . Closed head injury   . TOBACCO ABUSE 07/13/2010  . Palpitations 07/13/2010  . Pleuritic chest pain 07/13/2010  . Anxiety state, unspecified 07/03/2008  . BACK PAIN, LUMBAR, CHRONIC 07/03/2008    LABS    Component Value Date/Time   NA 138 05/09/2016 1052   NA 135 09/14/2015 1220   NA 139 12/26/2013 1327   K 3.6 05/09/2016 1052   K 3.5 09/14/2015 1220   K 4.0 12/26/2013 1327   CL 105 05/09/2016 1052   CL 92 (L) 09/14/2015 1220   CL 96 12/26/2013 1327   CO2 25 05/09/2016 1052   CO2 28 09/14/2015 1220   CO2 29 12/26/2013 1327   GLUCOSE 124 (H)  05/09/2016 1052   GLUCOSE 137 (H) 09/14/2015 1220   GLUCOSE 112 (H) 12/26/2013 1327   BUN 8 05/09/2016 1052   BUN 14 09/14/2015 1220   BUN 12 12/26/2013 1327   CREATININE 0.80 05/09/2016 1052   CREATININE 0.90 09/14/2015 1220   CREATININE 0.93 12/26/2013 1327   CALCIUM 9.4 05/09/2016 1052   CALCIUM 10.8 (H) 09/14/2015 1220   CALCIUM 10.1 12/26/2013 1327   GFRNONAA >60 05/09/2016 1052   GFRNONAA >60 09/14/2015 1220   GFRNONAA 74 (L) 12/26/2013 1327   GFRAA >60 05/09/2016 1052   GFRAA >60 09/14/2015 1220   GFRAA 85 (L) 12/26/2013 1327   CMP     Component Value Date/Time   NA 138 05/09/2016 1052   K 3.6 05/09/2016 1052   CL 105 05/09/2016 1052   CO2 25 05/09/2016 1052   GLUCOSE 124 (H) 05/09/2016 1052   BUN 8 05/09/2016 1052   CREATININE 0.80  05/09/2016 1052   CALCIUM 9.4 05/09/2016 1052   PROT 8.8 (H) 09/14/2015 1220   ALBUMIN 5.2 (H) 09/14/2015 1220   AST 23 09/14/2015 1220   ALT 18 09/14/2015 1220   ALKPHOS 132 (H) 09/14/2015 1220   BILITOT 1.0 09/14/2015 1220   GFRNONAA >60 05/09/2016 1052   GFRAA >60 05/09/2016 1052       Component Value Date/Time   WBC 7.0 05/09/2016 1052   WBC 11.8 (H) 09/14/2015 1220   WBC 11.0 (H) 12/26/2013 1327   HGB 13.4 05/09/2016 1052   HGB 15.9 (H) 09/14/2015 1220   HGB 16.3 (H) 12/26/2013 1327   HCT 40.2 05/09/2016 1052   HCT 47.4 (H) 09/14/2015 1220   HCT 46.1 (H) 12/26/2013 1327   MCV 90.7 05/09/2016 1052   MCV 90.1 09/14/2015 1220   MCV 89.9 12/26/2013 1327    Lipid Panel     Component Value Date/Time   CHOL 152 05/10/2016 0519   TRIG 90 05/10/2016 0519   HDL 50 05/10/2016 0519   CHOLHDL 3.0 05/10/2016 0519   VLDL 18 05/10/2016 0519   LDLCALC 84 05/10/2016 0519    ABG    Component Value Date/Time   PHART 7.510 (H) 09/14/2015 1315   PCO2ART 30.4 (L) 09/14/2015 1315   PO2ART 77.1 (L) 09/14/2015 1315   HCO3 26.3 (H) 09/14/2015 1315   TCO2 19.0 09/14/2015 1315   O2SAT 95.7 09/14/2015 1315     Lab Results  Component Value Date   TSH 0.716 05/09/2016   BNP (last 3 results) No results for input(s): BNP in the last 8760 hours.  ProBNP (last 3 results) No results for input(s): PROBNP in the last 8760 hours.  Cardiac Panel (last 3 results) No results for input(s): CKTOTAL, CKMB, TROPONINI, RELINDX in the last 72 hours.  Iron/TIBC/Ferritin/ %Sat No results found for: IRON, TIBC, FERRITIN, IRONPCTSAT   EKG Orders placed or performed during the hospital encounter of 05/09/16  . EKG 12-Lead  . EKG 12-Lead  . EKG 12-Lead (at 6am)  . EKG 12-Lead  . EKG 12-Lead  . EKG 12-Lead (at 6am)  . EKG     Prior Assessment and Plan Problem List as of 05/18/2016 Reviewed: 05/09/2016  1:12 PM by Irwin Brakeman, MD     Respiratory   COPD (chronic obstructive pulmonary  disease) ([place])     Digestive   IBS (irritable bowel syndrome)   GERD (gastroesophageal reflux disease)   Unspecified constipation   Crohn's disease ([place])     Endocrine   Diabetes mellitus without complication ([place])  Other   Anxiety state, unspecified   Last Assessment & Plan 08/25/2010 Office Visit Written 09/09/2010  7:07 AM by Yehuda Savannah, MD    Anxiety is likely significantly contributing to [place]'s symptoms.  Although beta blocker appears to be having salutary effects, consideration should be given to direct treatment, either pharmacologic or behavioral or both.      BACK PAIN, LUMBAR, CHRONIC   TOBACCO ABUSE   Last Assessment & Plan 08/07/2014 Office Visit Edited 08/07/2014  2:33 PM by Imogene Burn, PA-C    Smoking cessation discussed. She doesn't want take any medications to help him to try to quit on her own.      Palpitations   Last Assessment & Plan 08/07/2014 Office Visit Edited 08/07/2014  2:39 PM by Imogene Burn, PA-C    Patient complains a 3 week history of worsening palpitations at rest and with exercise, but more with exercise. Is associated with an elevated blood pressure followed by stress and anxiety and panic. She drinks 4 cans of [employer] daily and smokes approximately a pack of cigarettes daily. Recommend cutting back on her caffeine intake and smoking cessation. We'll place an event monitor on her to rule out any significant arrhythmias. We'll also order a stress Myoview to rule out ischemia. Follow-up in one month to become established with cardiologist. Patient should have a TSH at that has not been already drawn by Dr. [place] Crane.      Pleuritic chest pain   Last Assessment & Plan 08/07/2014 Office Visit Written 08/07/2014  2:34 PM by Imogene Burn, PA-C    Patient has had some atypical chest pain. She does have multiple cardiac risk factors including diabetes mellitus, hypertension, smoker and family history of CAD. She does not know her cholesterol status but  just had blood work drawn. Will order stress Myoview. She had negative stress echo in 2012.      Hyperglycemia   Chronic low back pain   Closed head injury   Abdominal pain, chronic, epigastric   Abdominal pain, acute, periumbilical   Paresthesia   Last Assessment & Plan 11/25/2011 Office Visit Written 11/25/2011  2:37 PM by Lendon Colonel, NP    She was seen by [facility], neurologist, in [place] in 2011. She did not have insurance at the time and therefore was unable to have further testing she is now on Medicaid and is able to proceed with testing. I will refer her to Dr. 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      Dyspnea   Last Assessment & Plan 01/26/2012 Office Visit Written 01/26/2012  2:08 PM by Imogene Burn, PA    Patient is short of breath when she lays down at night. She had a normal stress echo in March 2012. Her sleep study was equivocal and she will see Dr. Luan Pulling in followup. She is urged to quit smoking and lose weight.      Anxiety   Tobacco abuse   PTSD (post-traumatic stress disorder)   Palpitation   Chest pain at rest       Imaging: Dg Chest 2 View  Result Date: 05/09/2016 CLINICAL DATA:  Chest pain and shortness of breath today. EXAM: CHEST  2 VIEW COMPARISON:  PA and lateral chest 01/29/2016. FINDINGS: Lungs are clear. Heart size is normal. No pneumothorax or pleural effusion. No bony abnormality. IMPRESSION: Negative chest. Electronically Signed   By: Marcello Moores  Dalessio M.D.   On: 05/09/2016 11:09   Nm Myocar Multi W/spect W/wall Motion / Ef  Result Date: 05/10/2016  No diagnostic ST segment changes. Low risk Duke treadmill score of 8.5.  Blood pressure demonstrated a hypertensive response to exercise.  Small, mild intensity, mid anteroseptal defect that is partially reversible. Suggestive of variable soft tissue attenuation, cannot rule out small ischemic  zone.  This is a low risk study.  Nuclear stress EF: 80%.
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
## 2016-05-18 NOTE — ED Notes (Signed)
This nurse called about TTS, state it will be done around 1830.

## 2016-12-20 ENCOUNTER — Emergency Department (HOSPITAL_COMMUNITY)
Admission: EM | Admit: 2016-12-20 | Discharge: 2016-12-20 | Disposition: A | Discharge status: Home / Self Care | Payer: Self-pay | Attending: Emergency Medicine | Admitting: Emergency Medicine

## 2016-12-20 ENCOUNTER — Encounter (HOSPITAL_COMMUNITY): Payer: Self-pay | Admitting: Emergency Medicine

## 2016-12-20 DIAGNOSIS — J45909 Unspecified asthma, uncomplicated: Secondary | ICD-10-CM | POA: Insufficient documentation

## 2016-12-20 DIAGNOSIS — I509 Heart failure, unspecified: Secondary | ICD-10-CM | POA: Insufficient documentation

## 2016-12-20 DIAGNOSIS — I110 Hypertensive heart disease with heart failure: Secondary | ICD-10-CM | POA: Insufficient documentation

## 2016-12-20 DIAGNOSIS — J449 Chronic obstructive pulmonary disease, unspecified: Secondary | ICD-10-CM | POA: Insufficient documentation

## 2016-12-20 DIAGNOSIS — Z91040 Latex allergy status: Secondary | ICD-10-CM | POA: Insufficient documentation

## 2016-12-20 DIAGNOSIS — E876 Hypokalemia: Secondary | ICD-10-CM | POA: Insufficient documentation

## 2016-12-20 DIAGNOSIS — F17210 Nicotine dependence, cigarettes, uncomplicated: Secondary | ICD-10-CM | POA: Insufficient documentation

## 2016-12-20 DIAGNOSIS — Z79899 Other long term (current) drug therapy: Secondary | ICD-10-CM | POA: Insufficient documentation

## 2016-12-20 DIAGNOSIS — E119 Type 2 diabetes mellitus without complications: Secondary | ICD-10-CM | POA: Insufficient documentation

## 2016-12-20 DIAGNOSIS — K529 Noninfective gastroenteritis and colitis, unspecified: Secondary | ICD-10-CM | POA: Insufficient documentation

## 2016-12-20 DIAGNOSIS — Z7982 Long term (current) use of aspirin: Secondary | ICD-10-CM | POA: Insufficient documentation

## 2016-12-20 LAB — COMPREHENSIVE METABOLIC PANEL
ALBUMIN: 4.3 g/dL (ref 3.5–5.0)
ALK PHOS: 77 U/L (ref 38–126)
ALT: 36 U/L (ref 14–54)
ANION GAP: 10 (ref 5–15)
AST: 52 U/L — ABNORMAL HIGH (ref 15–41)
BUN: 8 mg/dL (ref 6–20)
CHLORIDE: 101 mmol/L (ref 101–111)
CO2: 27 mmol/L (ref 22–32)
Calcium: 9.2 mg/dL (ref 8.9–10.3)
Creatinine, Ser: 0.89 mg/dL (ref 0.44–1.00)
GFR calc Af Amer: >60 mL/min (ref >60)
GFR calc non Af Amer: >60 mL/min (ref >60)
GLUCOSE: 116 mg/dL — AB (ref 65–99)
POTASSIUM: 2.8 mmol/L — AB (ref 3.5–5.1)
SODIUM: 138 mmol/L (ref 135–145)
Total Bilirubin: 0.3 mg/dL (ref 0.3–1.2)
Total Protein: 7.9 g/dL (ref 6.5–8.1)

## 2016-12-20 LAB — URINALYSIS, ROUTINE W REFLEX MICROSCOPIC
BILIRUBIN URINE: NEGATIVE
Glucose, UA: NEGATIVE mg/dL
HGB URINE DIPSTICK: NEGATIVE
Ketones, ur: NEGATIVE mg/dL
Leukocytes, UA: NEGATIVE
NITRITE: NEGATIVE
PH: 6 (ref 5.0–8.0)
Protein, ur: NEGATIVE mg/dL
SPECIFIC GRAVITY, URINE: 1.013 (ref 1.005–1.030)

## 2016-12-20 LAB — CBC WITH DIFFERENTIAL/PLATELET
Basophils Absolute: 0 10*3/uL (ref 0.0–0.1)
Basophils Relative: 0 %
Eosinophils Absolute: 0.2 10*3/uL (ref 0.0–0.7)
Eosinophils Relative: 2 %
HEMATOCRIT: 44.4 % (ref 36.0–46.0)
HEMOGLOBIN: 15.4 g/dL — AB (ref 12.0–15.0)
LYMPHS ABS: 2.2 10*3/uL (ref 0.7–4.0)
Lymphocytes Relative: 23 %
MCH: 32.2 pg (ref 26.0–34.0)
MCHC: 34.7 g/dL (ref 30.0–36.0)
MCV: 92.7 fL (ref 78.0–100.0)
MONOS PCT: 8 %
Monocytes Absolute: 0.7 10*3/uL (ref 0.1–1.0)
NEUTROS ABS: 6.4 10*3/uL (ref 1.7–7.7)
NEUTROS PCT: 67 %
Platelets: 301 10*3/uL (ref 150–400)
RBC: 4.79 MIL/uL (ref 3.87–5.11)
RDW: 13 % (ref 11.5–15.5)
WBC: 9.5 10*3/uL (ref 4.0–10.5)

## 2016-12-20 LAB — LIPASE, BLOOD: Lipase: 32 U/L (ref 11–51)

## 2016-12-20 MED ORDER — SODIUM CHLORIDE 0.9 % IV BOLUS (SEPSIS): 1000.0000 mL | Freq: Once | INTRAVENOUS | Status: DC

## 2016-12-20 MED ORDER — SODIUM CHLORIDE 0.9 % IV SOLN
INTRAVENOUS | Status: DC
  Administered 2016-12-20: 14:00:00 via INTRAVENOUS

## 2016-12-20 MED ORDER — ONDANSETRON HCL 4 MG/2ML IJ SOLN
4.0000 mg | Freq: Once | INTRAMUSCULAR | Status: AC
  Administered 2016-12-20: 4 mg via INTRAVENOUS

## 2016-12-20 MED ORDER — PROMETHAZINE HCL 25 MG/ML IJ SOLN
12.5000 mg | Freq: Once | INTRAMUSCULAR | Status: AC
  Administered 2016-12-20: 12.5 mg via INTRAVENOUS
  Filled 2016-12-20: qty 1

## 2016-12-20 MED ORDER — POTASSIUM CHLORIDE 10 MEQ/100ML IV SOLN
10.0000 meq | Freq: Once | INTRAVENOUS | Status: AC
  Administered 2016-12-20: 10 meq via INTRAVENOUS
  Filled 2016-12-20: qty 100

## 2016-12-20 MED ORDER — ONDANSETRON HCL 4 MG/2ML IJ SOLN
INTRAMUSCULAR | Status: AC
  Administered 2016-12-20: 4 mg via INTRAVENOUS
  Filled 2016-12-20: qty 2

## 2016-12-20 MED ORDER — PROMETHAZINE HCL 25 MG PO TABS: 25.0000 mg | ORAL_TABLET | Freq: Four times a day (QID) | ORAL | 0 refills | Status: DC | PRN

## 2016-12-20 MED ORDER — FAMOTIDINE IN NACL 20-0.9 MG/50ML-% IV SOLN
20.0000 mg | Freq: Once | INTRAVENOUS | Status: AC
Start: 2016-12-20 — End: 2016-12-20
  Administered 2016-12-20: 20 mg via INTRAVENOUS
  Filled 2016-12-20: qty 50

## 2016-12-20 MED ORDER — POTASSIUM CHLORIDE CRYS ER 20 MEQ PO TBCR: 20.0000 meq | EXTENDED_RELEASE_TABLET | Freq: Two times a day (BID) | ORAL | 0 refills | Status: DC

## 2016-12-20 MED ORDER — SODIUM CHLORIDE 0.9 % IV BOLUS (SEPSIS)
1000.0000 mL | Freq: Once | INTRAVENOUS | Status: AC
  Administered 2016-12-20: 1000 mL via INTRAVENOUS

## 2016-12-20 NOTE — Discharge Instructions (Signed)
Make sure you are drinking plenty of fluids as discussed.  I would slowly advance your diet as tolerated, keeping it bland and considering the b.r.a.t diet for the next 1-2 days (bananas, rice, applesauce, toast).  Get rechecked for any worsened symptoms or if they persist beyond the next couple of days.

## 2016-12-20 NOTE — ED Triage Notes (Signed)
Patient complaining of vomiting since Friday. States grandchildren had same symptoms last week.

## 2016-12-22 NOTE — ED Provider Notes (Signed)
[place] DEPT Provider Note   CSN: [id number] Arrival date & time: 12/20/16  1234     History   Chief Complaint Chief Complaint  Patient presents with  . Emesis    HPI [name] is a 46 y.o. female presenting with day 4 of vomiting with loose but not frankly diarrheal stools (started today) and suspects this is infection which her grandchildren had several days ago.  She reports she has not been able to tolerate and PO intake. She describes cramping lower abdominal pain which improves with emesis or stool.  She denies fevers, does endorse generalized fatigue. She has had no tx for her symptoms prior to arrival.  HPI  Past Medical History:  Diagnosis Date  . Anxiety   . Asthma   . Chest pain    normal stress echo 05/2007  . CHF (congestive heart failure) ([place])   . Chronic low back pain   . Closed head injury    memory disturbance  . Collagen vascular disease ([place])   . COPD (chronic obstructive pulmonary disease) ([place])   . Crohn's disease ([place])   . Diabetes mellitus without complication ([place])   . GERD (gastroesophageal reflux disease)    EGD 01/2007 by Dr.Rourke small hiatal hernia s/p 39 french maloney   . Hyperglycemia    fasting  . Hypertension   . IBS (irritable bowel syndrome)    chronic constipation h/o colon polyps in 2002 at [facility] per patient   . Palpitation    with tachycardia  . PONV (postoperative nausea and vomiting)   . PTSD (post-traumatic stress disorder)   . Tobacco abuse    25 yrs pack history 1/2 pack daily    Patient Active Problem List   Diagnosis Date Noted  . Chest pain at rest 05/09/2016  . COPD (chronic obstructive pulmonary disease) ([place])   . Crohn's disease ([place])   . Diabetes mellitus without complication ([place])   . Anxiety   . Tobacco abuse   . PTSD (post-traumatic stress disorder)   . Palpitation   . Unspecified constipation 09/20/2012  . Dyspnea 01/26/2012  . Paresthesia 11/25/2011  . Abdominal pain, chronic, epigastric  11/01/2011  . Abdominal pain, acute, periumbilical 54/98/2641  . GERD (gastroesophageal reflux disease)   . Hyperglycemia   . Chronic low back pain   . IBS (irritable bowel syndrome)   . Closed head injury   . TOBACCO ABUSE 07/13/2010  . Palpitations 07/13/2010  . Pleuritic chest pain 07/13/2010  . Anxiety state, unspecified 07/03/2008  . BACK PAIN, LUMBAR, CHRONIC 07/03/2008    Past Surgical History:  Procedure Laterality Date  . ABDOMINAL HYSTERECTOMY     with right salpingo oophorectomy 2005  . APPENDECTOMY  2006  . CESAREAN SECTION     1996  . CHOLECYSTECTOMY    . COLONOSCOPY  2010   Dr. Gala Romney; negative except for hemorrhoids  . ESOPHAGEAL DILATION N/A 10/25/2014   Procedure: ESOPHAGEAL DILATION;  Surgeon: Rogene Houston, MD;  [facility];  Service: Endoscopy;  Laterality: N/A;  . ESOPHAGOGASTRODUODENOSCOPY N/A 10/25/2014   Procedure: ESOPHAGOGASTRODUODENOSCOPY (EGD);  Surgeon: Rogene Houston, MD;  Location[facility];  Service: Endoscopy;  Laterality: N/A;  1250  . OOPHORECTOMY     left for torsion and ovarian fibroma; uterine myoma resected 1995    OB History    Gravida Para Term Preterm AB Living   3 1 1   2      SAB TAB Ectopic Multiple Live Births  2               Home Medications    Prior to Admission medications   Medication Sig Start Date End Date Taking? Authorizing Provider  albuterol (PROVENTIL HFA;VENTOLIN HFA) 108 (90 Base) MCG/ACT inhaler Inhale 2 puffs into the lungs every 6 (six) hours as needed for wheezing or shortness of breath.   Yes [provider]  aspirin EC 81 MG tablet Take 1 tablet (81 mg total) by mouth daily. 05/10/16  Yes Johnson, Clanford L, MD  nitroGLYCERIN (NITROSTAT) 0.4 MG SL tablet Place 1 tablet (0.4 mg total) under the tongue every 5 (five) minutes as needed for chest pain. 04/15/15  Yes Herminio Commons, MD  OVER THE COUNTER MEDICATION Take 1 tablet by mouth as needed. Acid reducer she gets from  [employer]   Yes [provider]  ranolazine (RANEXA) 500 MG 12 hr tablet Take 1 tablet (500 mg total) by mouth 2 (two) times daily. 05/10/16  Yes Johnson, Clanford L, MD  SPIRIVA RESPIMAT 2.5 MCG/ACT AERS Inhale 1 puff into the lungs 2 (two) times daily.  07/27/14  Yes [provider]  triazolam (HALCION) 0.25 MG tablet Take 1-1.5 tablets by mouth at bedtime as needed for sleep. 10/31/16  Yes [provider]  LORazepam (ATIVAN) 1 MG tablet Take 1 tablet (1 mg total) by mouth 3 (three) times daily as needed for anxiety. Patient not taking: Reported on 12/20/2016 05/18/16   Carmin Muskrat, MD  potassium chloride SA (K-DUR,KLOR-CON) 20 MEQ tablet Take 1 tablet (20 mEq total) by mouth 2 (two) times daily. 12/20/16   Evalee Jefferson, PA-C  promethazine (PHENERGAN) 25 MG tablet Take 1 tablet (25 mg total) by mouth every 6 (six) hours as needed for nausea or vomiting. 12/20/16   Evalee Jefferson, PA-C    Family History Family History  Problem Relation Age of Onset  . Cancer Mother   . Heart failure Mother   . Hypertension Mother   . Heart attack Mother   . Cancer Father   . Heart failure Father   . Diabetes Father   . Heart attack Father 73    Social History Social History  Substance Use Topics  . Smoking status: Current Some Day Smoker    Packs/day: 0.50    Years: 20.00    Types: Cigarettes    Start date: 09/26/1984  . Smokeless tobacco: Never Used  . Alcohol use No     Allergies   Codeine; Penicillins; Acetaminophen; Ativan [lorazepam]; Benadryl [diphenhydramine hcl]; Dilaudid [hydromorphone hcl]; Hydrocodone; Morphine and related; Oxycodone; Strawberry extract; Watermelon concentrate; and Latex   Review of Systems Review of Systems  Constitutional: Negative for chills and fever.  HENT: Negative.  Negative for congestion and sore throat.   Eyes: Negative.   Respiratory: Negative for chest tightness and shortness of breath.   Cardiovascular: Negative for chest  pain.  Gastrointestinal: Positive for abdominal pain, diarrhea, nausea and vomiting.  Genitourinary: Negative.   Musculoskeletal: Negative for arthralgias, joint swelling and neck pain.  Skin: Negative.  Negative for rash and wound.  Neurological: Negative for dizziness, weakness, light-headedness, numbness and headaches.  Psychiatric/Behavioral: Negative.      Physical Exam Updated Vital Signs BP 120/69   Pulse 71   Temp 97.7 F (36.5 C) (Oral)   Resp 16   Ht 5' 5"  (1.651 m)   Wt 69.9 kg (154 lb)   SpO2 98%   BMI 25.63 kg/m   Physical Exam  Constitutional:  She appears well-developed and well-nourished.  HENT:  Head: Normocephalic and atraumatic.  Eyes: Conjunctivae are normal.  Neck: Normal range of motion.  Cardiovascular: Normal rate, regular rhythm, normal heart sounds and intact distal pulses.   Pulmonary/Chest: Effort normal and breath sounds normal. She has no wheezes.  Abdominal: Soft. Bowel sounds are normal. She exhibits no mass. There is tenderness. There is no guarding.  Mild left upper quad discomfort. Non acute abd.  Musculoskeletal: Normal range of motion.  Neurological: She is alert.  Skin: Skin is warm and dry.  Psychiatric: She has a normal mood and affect.  Nursing note and vitals reviewed.    ED Treatments / Results  Labs (all labs ordered are listed, but only abnormal results are displayed) Labs Reviewed  COMPREHENSIVE METABOLIC PANEL - Abnormal; Notable for the following:       Result Value   Potassium 2.8 (*)    Glucose, Bld 116 (*)    AST 52 (*)    All other components within normal limits  CBC WITH DIFFERENTIAL/PLATELET - Abnormal; Notable for the following:    Hemoglobin 15.4 (*)    All other components within normal limits  URINALYSIS, ROUTINE W REFLEX MICROSCOPIC - Abnormal; Notable for the following:    APPearance HAZY (*)    All other components within normal limits  LIPASE, BLOOD    EKG  EKG Interpretation None        Radiology No results found.  Procedures Procedures (including critical care time)  Medications Ordered in ED Medications  sodium chloride 0.9 % bolus 1,000 mL (0 mLs Intravenous Stopped 12/20/16 1400)  ondansetron (ZOFRAN) injection 4 mg (4 mg Intravenous Given 12/20/16 1256)  promethazine (PHENERGAN) injection 12.5 mg (12.5 mg Intravenous Given 12/20/16 1335)  famotidine (PEPCID) IVPB 20 mg premix (0 mg Intravenous Stopped 12/20/16 1424)  potassium chloride 10 mEq in 100 mL IVPB (0 mEq Intravenous Stopped 12/20/16 1512)     Initial Impression / Assessment and Plan / ED Course  I have reviewed the triage vital signs and the nursing notes.  Pertinent labs & imaging results that were available during my care of the patient were reviewed by me and considered in my medical decision making (see chart for details).     Labs reviewed with significant hypokalemia.  She was given potassium IV and po along with IV fluids.  zofran which was less than helpful, phenergan added and patient was then able to tolerate PO intake.  She had no diarrhea while in the dept and felt improved at dc.  Re-exam of abd  With no acute findings. She was  Prescribed phenergan, potassium for continued home replenishment, discussed brat diet and increased fluid intake. Advised close f/u here or by pcp if sx persist or worsen.  The patient appears reasonably screened and/or stabilized for discharge and I doubt any other medical condition or other [facility] requiring further screening, evaluation, or treatment in the ED at this time prior to discharge.   Final Clinical Impressions(s) / ED Diagnoses   Final diagnoses:  Gastroenteritis  Hypokalemia    New Prescriptions Discharge Medication List as of 12/20/2016  4:33 PM    START taking these medications   Details  potassium chloride SA (K-DUR,KLOR-CON) 20 MEQ tablet Take 1 tablet (20 mEq total) by mouth 2 (two) times daily., Starting Mon 12/20/2016, Print     promethazine (PHENERGAN) 25 MG tablet Take 1 tablet (25 mg total) by mouth every 6 (six) hours as needed for nausea or  vomiting., Starting Mon 12/20/2016, Print         Evalee Jefferson, [name] 12/22/16 2217    Davonna Belling, MD 12/23/16 [phone]

## 2017-03-16 ENCOUNTER — Emergency Department (HOSPITAL_COMMUNITY)
Admission: EM | Admit: 2017-03-16 | Discharge: 2017-03-17 | Disposition: A | Discharge status: Home / Self Care | Payer: BLUE CROSS/BLUE SHIELD | Attending: Emergency Medicine | Admitting: Emergency Medicine

## 2017-03-16 ENCOUNTER — Encounter (HOSPITAL_COMMUNITY): Payer: Self-pay | Admitting: Emergency Medicine

## 2017-03-16 ENCOUNTER — Other Ambulatory Visit: Payer: Self-pay

## 2017-03-16 DIAGNOSIS — I119 Hypertensive heart disease without heart failure: Secondary | ICD-10-CM | POA: Insufficient documentation

## 2017-03-16 DIAGNOSIS — F17210 Nicotine dependence, cigarettes, uncomplicated: Secondary | ICD-10-CM | POA: Diagnosis not present

## 2017-03-16 DIAGNOSIS — E119 Type 2 diabetes mellitus without complications: Secondary | ICD-10-CM | POA: Insufficient documentation

## 2017-03-16 DIAGNOSIS — J449 Chronic obstructive pulmonary disease, unspecified: Secondary | ICD-10-CM | POA: Insufficient documentation

## 2017-03-16 DIAGNOSIS — K5289 Other specified noninfective gastroenteritis and colitis: Secondary | ICD-10-CM | POA: Diagnosis not present

## 2017-03-16 DIAGNOSIS — R197 Diarrhea, unspecified: Secondary | ICD-10-CM | POA: Diagnosis present

## 2017-03-16 DIAGNOSIS — J45909 Unspecified asthma, uncomplicated: Secondary | ICD-10-CM | POA: Diagnosis not present

## 2017-03-16 DIAGNOSIS — Z79899 Other long term (current) drug therapy: Secondary | ICD-10-CM | POA: Insufficient documentation

## 2017-03-16 DIAGNOSIS — K529 Noninfective gastroenteritis and colitis, unspecified: Secondary | ICD-10-CM

## 2017-03-16 LAB — CBC
HCT: 41.6 % (ref 36.0–46.0)
Hemoglobin: 14.1 g/dL (ref 12.0–15.0)
MCH: 32.1 pg (ref 26.0–34.0)
MCHC: 33.9 g/dL (ref 30.0–36.0)
MCV: 94.8 fL (ref 78.0–100.0)
PLATELETS: 331 10*3/uL (ref 150–400)
RBC: 4.39 MIL/uL (ref 3.87–5.11)
RDW: 13 % (ref 11.5–15.5)
WBC: 8.9 10*3/uL (ref 4.0–10.5)

## 2017-03-16 LAB — COMPREHENSIVE METABOLIC PANEL
ALBUMIN: 4.3 g/dL (ref 3.5–5.0)
ALT: 14 U/L (ref 14–54)
AST: 21 U/L (ref 15–41)
Alkaline Phosphatase: 87 U/L (ref 38–126)
Anion gap: 10 (ref 5–15)
BUN: 8 mg/dL (ref 6–20)
CO2: 28 mmol/L (ref 22–32)
CREATININE: 0.93 mg/dL (ref 0.44–1.00)
Calcium: 9.9 mg/dL (ref 8.9–10.3)
Chloride: 103 mmol/L (ref 101–111)
GFR calc Af Amer: >60 mL/min (ref >60)
GFR calc non Af Amer: >60 mL/min (ref >60)
Glucose, Bld: 99 mg/dL (ref 65–99)
Potassium: 3.3 mmol/L — ABNORMAL LOW (ref 3.5–5.1)
SODIUM: 141 mmol/L (ref 135–145)
Total Bilirubin: 0.5 mg/dL (ref 0.3–1.2)
Total Protein: 7.5 g/dL (ref 6.5–8.1)

## 2017-03-16 LAB — LIPASE, BLOOD: Lipase: 37 U/L (ref 11–51)

## 2017-03-16 MED ORDER — ONDANSETRON HCL 4 MG/2ML IJ SOLN
4.0000 mg | Freq: Once | INTRAMUSCULAR | Status: AC
  Administered 2017-03-17: 4 mg via INTRAVENOUS
  Filled 2017-03-16: qty 2

## 2017-03-16 MED ORDER — DIPHENOXYLATE-ATROPINE 2.5-0.025 MG PO TABS
2.0000 | ORAL_TABLET | Freq: Once | ORAL | Status: AC
  Administered 2017-03-17: 2 via ORAL
  Filled 2017-03-16: qty 2

## 2017-03-16 MED ORDER — SODIUM CHLORIDE 0.9 % IV BOLUS (SEPSIS)
1000.0000 mL | Freq: Once | INTRAVENOUS | Status: AC
  Administered 2017-03-17: 1000 mL via INTRAVENOUS

## 2017-03-16 NOTE — ED Triage Notes (Signed)
Pt c/o diarrhea and nausea since Sunday. Pt states she is having trouble controlling her bowels and is having 6-8 episodes a day.

## 2017-03-16 NOTE — ED Notes (Signed)
Have notified pt about need for urine sample. Pt states she does not have to go just yet.

## 2017-03-17 LAB — URINALYSIS, ROUTINE W REFLEX MICROSCOPIC
Bilirubin Urine: NEGATIVE
GLUCOSE, UA: NEGATIVE mg/dL
HGB URINE DIPSTICK: NEGATIVE
Ketones, ur: NEGATIVE mg/dL
Leukocytes, UA: NEGATIVE
Nitrite: NEGATIVE
Protein, ur: NEGATIVE mg/dL
Specific Gravity, Urine: 1.027 (ref 1.005–1.030)
pH: 5 (ref 5.0–8.0)

## 2017-03-17 MED ORDER — DIPHENOXYLATE-ATROPINE 2.5-0.025 MG PO TABS: 1.0000 | ORAL_TABLET | Freq: Four times a day (QID) | ORAL | 0 refills | Status: DC | PRN

## 2017-03-17 MED ORDER — ONDANSETRON 8 MG PO TBDP: 8.0000 mg | ORAL_TABLET | Freq: Three times a day (TID) | ORAL | 0 refills | Status: DC | PRN

## 2017-03-17 NOTE — Discharge Instructions (Signed)
Rest and make sure you are drinking plenty of fluids.  Use the medications prescribed if you have persistent nausea and diarrhea.  Use caution with the Lomotil however as this is a very strong antidiarrheal medication.  Only continue taking if you continue to have diarrhea.  If you continue to use it after your symptoms have resolved, it can make you fairly constipated.  Get rechecked by your doctor or return here for any new or worsening symptoms.  As discussed, your potassium is borderline low this evening you can replace this electrolyte by diet including bananas, orange juice.

## 2017-03-17 NOTE — ED Provider Notes (Signed)
[facility] EMERGENCY DEPARTMENT Provider Note   CSN: [id number] Arrival date & time: 03/16/17  1946     History   Chief Complaint Chief Complaint  Patient presents with  . Diarrhea    HPI [name] is a 46 y.o. female with a past medical history as outlined below, most significant for history of well-controlled Crohn's disease, diet-controlled diabetes, GERD, hypertension and CHF presenting with a 4-day history of nausea, vomiting and diarrhea.  She reports having nausea persistently since her symptoms began in association with diarrhea, 6-8 episodes of non-bloody, brown watery stools since her symptoms began.  She has had 3 episodes of nonbloody emesis yesterday, none today.  She has had no fevers or chills and reports has been able to tolerate small portions of solid foods, stating she ate a salad this afternoon and is also been able to tolerate fluid intake.  She denies abdominal pain except for cramping prior to BMs which resolve after a bowel movement.  She was exposed to a coworker with similar symptoms late last week.  She has had no recent antibiotic use or foreign travel.  She has not required medical treatment of her Crohn's disease in recent years which has been fairly well controlled.  She has had no medications prior to arrival for her symptoms..  The history is provided by the patient.    Past Medical History:  Diagnosis Date  . Anxiety   . Asthma   . Chest pain    normal stress echo 05/2007  . CHF (congestive heart failure) ([place])   . Chronic low back pain   . Closed head injury    memory disturbance  . Collagen vascular disease ([place])   . COPD (chronic obstructive pulmonary disease) ([place])   . Crohn's disease ([place])   . Diabetes mellitus without complication ([place])   . GERD (gastroesophageal reflux disease)    EGD 01/2007 by Dr.Rourke small hiatal hernia s/p 29 french maloney   . Hyperglycemia    fasting  . Hypertension   . IBS (irritable bowel syndrome)    chronic constipation h/o colon polyps in 2002 at [facility] per patient   . Palpitation    with tachycardia  . PONV (postoperative nausea and vomiting)   . PTSD (post-traumatic stress disorder)   . Tobacco abuse    25 yrs pack history 1/2 pack daily    Patient Active Problem List   Diagnosis Date Noted  . Chest pain at rest 05/09/2016  . COPD (chronic obstructive pulmonary disease) ([place])   . Crohn's disease ([place])   . Diabetes mellitus without complication ([place])   . Anxiety   . Tobacco abuse   . PTSD (post-traumatic stress disorder)   . Palpitation   . Unspecified constipation 09/20/2012  . Dyspnea 01/26/2012  . Paresthesia 11/25/2011  . Abdominal pain, chronic, epigastric 11/01/2011  . Abdominal pain, acute, periumbilical 26/71/2458  . GERD (gastroesophageal reflux disease)   . Hyperglycemia   . Chronic low back pain   . IBS (irritable bowel syndrome)   . Closed head injury   . TOBACCO ABUSE 07/13/2010  . Palpitations 07/13/2010  . Pleuritic chest pain 07/13/2010  . Anxiety state, unspecified 07/03/2008  . BACK PAIN, LUMBAR, CHRONIC 07/03/2008    Past Surgical History:  Procedure Laterality Date  . ABDOMINAL HYSTERECTOMY     with right salpingo oophorectomy 2005  . APPENDECTOMY  2006  . CESAREAN SECTION     1996  . CHOLECYSTECTOMY    . COLONOSCOPY  2010  Dr. Gala Romney; negative except for hemorrhoids  . OOPHORECTOMY     left for torsion and ovarian fibroma; uterine myoma resected 1995    OB History    Gravida Para Term Preterm AB Living   3 1 1   2      SAB TAB Ectopic Multiple Live Births   2               Home Medications    Prior to Admission medications   Medication Sig Start Date End Date Taking? Authorizing Provider  albuterol (PROVENTIL HFA;VENTOLIN HFA) 108 (90 Base) MCG/ACT inhaler Inhale 2 puffs into the lungs every 6 (six) hours as needed for wheezing or shortness of breath.   Yes [provider]  aspirin EC 81 MG tablet Take 1 tablet (81 mg  total) by mouth daily. 05/10/16  Yes Johnson, Clanford L, MD  nitroGLYCERIN (NITROSTAT) 0.4 MG SL tablet Place 1 tablet (0.4 mg total) under the tongue every 5 (five) minutes as needed for chest pain. 04/15/15  Yes Herminio Commons, MD  OVER THE COUNTER MEDICATION Take 1 tablet by mouth as needed. Acid reducer she gets from [employer]   Yes [provider]  ranolazine (RANEXA) 500 MG 12 hr tablet Take 1 tablet (500 mg total) by mouth 2 (two) times daily. 05/10/16  Yes Johnson, Clanford L, MD  SPIRIVA RESPIMAT 2.5 MCG/ACT AERS Inhale 1 puff into the lungs 2 (two) times daily.  07/27/14  Yes [provider]  triazolam (HALCION) 0.25 MG tablet Take 1-1.5 tablets by mouth at bedtime as needed for sleep. 10/31/16  Yes [provider]  diphenoxylate-atropine (LOMOTIL) 2.5-0.025 MG tablet Take 1 tablet 4 (four) times daily as needed by mouth for diarrhea or loose stools. 03/17/17   Evalee Jefferson, PA-C  LORazepam (ATIVAN) 1 MG tablet Take 1 tablet (1 mg total) by mouth 3 (three) times daily as needed for anxiety. Patient not taking: Reported on 12/20/2016 05/18/16   Carmin Muskrat, MD  ondansetron (ZOFRAN ODT) 8 MG disintegrating tablet Take 1 tablet (8 mg total) every 8 (eight) hours as needed by mouth for nausea or vomiting. 03/17/17   Evalee Jefferson, PA-C  potassium chloride SA (K-DUR,KLOR-CON) 20 MEQ tablet Take 1 tablet (20 mEq total) by mouth 2 (two) times daily. Patient not taking: Reported on 03/16/2017 12/20/16   Evalee Jefferson, PA-C  promethazine (PHENERGAN) 25 MG tablet Take 1 tablet (25 mg total) by mouth every 6 (six) hours as needed for nausea or vomiting. Patient not taking: Reported on 03/16/2017 12/20/16   Evalee Jefferson, PA-C    Family History Family History  Problem Relation Age of Onset  . Cancer Mother   . Heart failure Mother   . Hypertension Mother   . Heart attack Mother   . Cancer Father   . Heart failure Father   . Diabetes Father   . Heart attack Father 76     Social History Social History   Tobacco Use  . Smoking status: Current Some Day Smoker    Packs/day: 0.50    Years: 20.00    Pack years: 10.00    Types: Cigarettes    Start date: 09/26/1984  . Smokeless tobacco: Never Used  Substance Use Topics  . Alcohol use: No    Alcohol/week: 0.0 oz  . Drug use: No     Allergies   Codeine; Dilaudid [hydromorphone hcl]; Hydrocodone; Morphine and related; Oxycodone; Penicillins; Acetaminophen; Ativan [lorazepam]; Strawberry extract; Watermelon concentrate; Benadryl [diphenhydramine hcl]; and Latex  Review of Systems Review of Systems  Constitutional: Negative for chills and fever.  HENT: Negative for congestion and sore throat.   Eyes: Negative.   Respiratory: Negative for chest tightness and shortness of breath.   Cardiovascular: Negative for chest pain.  Gastrointestinal: Positive for diarrhea, nausea and vomiting. Negative for abdominal pain.  Genitourinary: Negative.   Musculoskeletal: Negative for arthralgias, joint swelling and neck pain.  Skin: Negative.  Negative for rash and wound.  Neurological: Positive for weakness. Negative for dizziness, light-headedness, numbness and headaches.  Psychiatric/Behavioral: Negative.      Physical Exam Updated Vital Signs BP 99/73 (BP Location: Left Arm)   Pulse 78   Temp 97.7 F (36.5 C)   Resp 16   Ht 5' 5"  (1.651 m)   Wt 65.8 kg (145 lb)   SpO2 95%   BMI 24.13 kg/m   Physical Exam  Constitutional: She appears well-developed and well-nourished.  HENT:  Head: Normocephalic and atraumatic.  Eyes: Conjunctivae are normal.  Neck: Normal range of motion.  Cardiovascular: Normal rate, regular rhythm, normal heart sounds and intact distal pulses.  Pulmonary/Chest: Effort normal and breath sounds normal. She has no wheezes.  Abdominal: Soft. Bowel sounds are normal. She exhibits no distension and no mass. There is no tenderness. There is no guarding.  Musculoskeletal: Normal  range of motion.  Neurological: She is alert.  Skin: Skin is warm and dry.  Psychiatric: She has a normal mood and affect.  Nursing note and vitals reviewed.    ED Treatments / Results  Labs (all labs ordered are listed, but only abnormal results are displayed)  Results for orders placed or performed during the hospital encounter of 03/16/17  Lipase, blood  Result Value Ref Range   Lipase 37 11 - 51 U/L  Comprehensive metabolic panel  Result Value Ref Range   Sodium 141 135 - 145 mmol/L   Potassium 3.3 (L) 3.5 - 5.1 mmol/L   Chloride 103 101 - 111 mmol/L   CO2 28 22 - 32 mmol/L   Glucose, Bld 99 65 - 99 mg/dL   BUN 8 6 - 20 mg/dL   Creatinine, Ser 0.93 0.44 - 1.00 mg/dL   Calcium 9.9 8.9 - 10.3 mg/dL   Total Protein 7.5 6.5 - 8.1 g/dL   Albumin 4.3 3.5 - 5.0 g/dL   AST 21 15 - 41 U/L   ALT 14 14 - 54 U/L   Alkaline Phosphatase 87 38 - 126 U/L   Total Bilirubin 0.5 0.3 - 1.2 mg/dL   GFR calc non Af Amer >60 >60 mL/min   GFR calc Af Amer >60 >60 mL/min   Anion gap 10 5 - 15  CBC  Result Value Ref Range   WBC 8.9 4.0 - 10.5 K/uL   RBC 4.39 3.87 - 5.11 MIL/uL   Hemoglobin 14.1 12.0 - 15.0 g/dL   HCT 41.6 36.0 - 46.0 %   MCV 94.8 78.0 - 100.0 fL   MCH 32.1 26.0 - 34.0 pg   MCHC 33.9 30.0 - 36.0 g/dL   RDW 13.0 11.5 - 15.5 %   Platelets 331 150 - 400 K/uL  Urinalysis, Routine w reflex microscopic  Result Value Ref Range   Color, Urine YELLOW YELLOW   APPearance HAZY (A) CLEAR   Specific Gravity, Urine 1.027 1.005 - 1.030   pH 5.0 5.0 - 8.0   Glucose, UA NEGATIVE NEGATIVE mg/dL   Hgb urine dipstick NEGATIVE NEGATIVE   Bilirubin Urine NEGATIVE NEGATIVE  Ketones, ur NEGATIVE NEGATIVE mg/dL   Protein, ur NEGATIVE NEGATIVE mg/dL   Nitrite NEGATIVE NEGATIVE   Leukocytes, UA NEGATIVE NEGATIVE   No results found.   EKG  EKG Interpretation None       Radiology No results found.  Procedures Procedures (including critical care time)  Medications Ordered in  ED Medications  sodium chloride 0.9 % bolus 1,000 mL (0 mLs Intravenous Stopped 03/17/17 0037)  ondansetron (ZOFRAN) injection 4 mg (4 mg Intravenous Given 03/17/17 0019)  diphenoxylate-atropine (LOMOTIL) 2.5-0.025 MG per tablet 2 tablet (2 tablets Oral Given 03/17/17 0019)     Initial Impression / Assessment and Plan / ED Course  I have reviewed the triage vital signs and the nursing notes.  Pertinent labs & imaging results that were available during my care of the patient were reviewed by me and considered in my medical decision making (see chart for details).     Patient with suspected gastroenteritis.  She has no symptoms or lab results or exam suggesting that this is a Crohn's flare.  Her abdomen is soft and nontender, absolutely no guarding on exam.  No distention.  She was given Zofran and tolerated p.o. intake.  She was also given a dose of Lomotil to help slow up the diarrhea.  Her initial blood pressure was low at 99/73 which patient states is actually fairly normal pressure for her.  Her orthostatics were reassuring and she had no symptoms with positional changes.  She was prescribed additional Zofran and Lomotil for home use.  Discussed extreme caution using Lomotil and only take this medication if she continues to have diarrhea.  Plan follow-up with her PCP for recheck if symptoms persist or worsen.  Final Clinical Impressions(s) / ED Diagnoses   Final diagnoses:  Gastroenteritis    ED Discharge Orders        Ordered    diphenoxylate-atropine (LOMOTIL) 2.5-0.025 MG tablet  4 times daily PRN     03/17/17 0053    ondansetron (ZOFRAN ODT) 8 MG disintegrating tablet  Every 8 hours PRN     03/17/17 0053       Evalee Jefferson, PA-C 03/17/17 9774    Milton Ferguson, MD 03/17/17 [phone]

## 2017-04-05 ENCOUNTER — Ambulatory Visit: Payer: Self-pay | Admitting: Family Medicine

## 2017-10-23 ENCOUNTER — Other Ambulatory Visit: Payer: Self-pay

## 2017-10-23 ENCOUNTER — Emergency Department (HOSPITAL_COMMUNITY)
Admission: EM | Admit: 2017-10-23 | Discharge: 2017-10-24 | Disposition: A | Discharge status: Home / Self Care | Payer: BLUE CROSS/BLUE SHIELD | Attending: Emergency Medicine | Admitting: Emergency Medicine

## 2017-10-23 DIAGNOSIS — F17210 Nicotine dependence, cigarettes, uncomplicated: Secondary | ICD-10-CM | POA: Diagnosis not present

## 2017-10-23 DIAGNOSIS — F419 Anxiety disorder, unspecified: Secondary | ICD-10-CM | POA: Diagnosis not present

## 2017-10-23 DIAGNOSIS — Z7982 Long term (current) use of aspirin: Secondary | ICD-10-CM | POA: Insufficient documentation

## 2017-10-23 DIAGNOSIS — E119 Type 2 diabetes mellitus without complications: Secondary | ICD-10-CM | POA: Insufficient documentation

## 2017-10-23 DIAGNOSIS — R42 Dizziness and giddiness: Secondary | ICD-10-CM | POA: Diagnosis present

## 2017-10-23 DIAGNOSIS — J45909 Unspecified asthma, uncomplicated: Secondary | ICD-10-CM | POA: Insufficient documentation

## 2017-10-23 DIAGNOSIS — Z79899 Other long term (current) drug therapy: Secondary | ICD-10-CM | POA: Diagnosis not present

## 2017-10-23 DIAGNOSIS — I509 Heart failure, unspecified: Secondary | ICD-10-CM | POA: Diagnosis not present

## 2017-10-23 DIAGNOSIS — I110 Hypertensive heart disease with heart failure: Secondary | ICD-10-CM | POA: Diagnosis not present

## 2017-10-23 NOTE — ED Triage Notes (Signed)
Pt states she had sudden onset of dizziness and weakness. Pt states it feels as if her blood sugar was dropping, per ems pt's blood sugar was 107.

## 2017-10-23 NOTE — ED Provider Notes (Signed)
[facility] EMERGENCY DEPARTMENT Provider Note   CSN: [id number] Arrival date & time: 10/23/17  2314     History   Chief Complaint Chief Complaint  Patient presents with  . Dizziness    HPI [name] is a 47 y.o. female.  Patient is a 47 year old female who presents to the emergency department by EMS because of dizziness and weakness.  The patient states this problem started about 2 to 3 days ago.  She is been having problems with headache.  She has been having dizziness where that she describes that feels as though she spinning.  She has had nausea.  Today when she was on her way to work she had a recurrent episode and she had to pull over and throw up.  She was able to get to a store not far away.  Where she had another episode of vomiting.  She felt weak and drained she felt as though her heart were racing and she had the store owners to call 911 for her.  Patient states she has not been eating well.  She has been drinking, but has been mostly [employer].  The patient also states that over the past week or more she has been dealing with extra stress because of family situations involving her daughter and her grandchildren.  This has bothered her greatly.  She states that even interferes with her rest at night.  The patient denies suicidal or homicidal ideations.     Past Medical History:  Diagnosis Date  . Anxiety   . Asthma   . Chest pain    normal stress echo 05/2007  . CHF (congestive heart failure) ([place])   . Chronic low back pain   . Closed head injury    memory disturbance  . Collagen vascular disease ([place])   . COPD (chronic obstructive pulmonary disease) ([place])   . Crohn's disease ([place])   . Diabetes mellitus without complication ([place])   . GERD (gastroesophageal reflux disease)    EGD 01/2007 by Dr.Rourke small hiatal hernia s/p 21 french maloney   . Hyperglycemia    fasting  . Hypertension   . IBS (irritable bowel syndrome)    chronic constipation h/o colon  polyps in 2002 at [facility] per patient   . Palpitation    with tachycardia  . PONV (postoperative nausea and vomiting)   . PTSD (post-traumatic stress disorder)   . Tobacco abuse    25 yrs pack history 1/2 pack daily    Patient Active Problem List   Diagnosis Date Noted  . Chest pain at rest 05/09/2016  . COPD (chronic obstructive pulmonary disease) ([place])   . Crohn's disease ([place])   . Diabetes mellitus without complication ([place])   . Anxiety   . Tobacco abuse   . PTSD (post-traumatic stress disorder)   . Palpitation   . Unspecified constipation 09/20/2012  . Dyspnea 01/26/2012  . Paresthesia 11/25/2011  . Abdominal pain, chronic, epigastric 11/01/2011  . Abdominal pain, acute, periumbilical 64/33/2951  . GERD (gastroesophageal reflux disease)   . Hyperglycemia   . Chronic low back pain   . IBS (irritable bowel syndrome)   . Closed head injury   . TOBACCO ABUSE 07/13/2010  . Palpitations 07/13/2010  . Pleuritic chest pain 07/13/2010  . Anxiety state, unspecified 07/03/2008  . BACK PAIN, LUMBAR, CHRONIC 07/03/2008    Past Surgical History:  Procedure Laterality Date  . ABDOMINAL HYSTERECTOMY     with right salpingo oophorectomy 2005  . APPENDECTOMY  2006  . [place]  . CHOLECYSTECTOMY    . COLONOSCOPY  2010   Dr. Gala Romney; negative except for hemorrhoids  . ESOPHAGEAL DILATION N/A 10/25/2014   Procedure: ESOPHAGEAL DILATION;  Surgeon: Rogene Houston, MD;  Location: [facility] ENDO SUITE;  Service: Endoscopy;  Laterality: N/A;  . ESOPHAGOGASTRODUODENOSCOPY N/A 10/25/2014   Procedure: ESOPHAGOGASTRODUODENOSCOPY (EGD);  Surgeon: Rogene Houston, MD;  Location: [facility] ENDO SUITE;  Service: Endoscopy;  Laterality: N/A;  1250  . OOPHORECTOMY     left for torsion and ovarian fibroma; uterine myoma resected 1995     OB History    Gravida  3   Para  1   Term  1   Preterm      AB  2   Living        SAB  2   TAB      Ectopic      Multiple      Live Births                Home Medications    Prior to Admission medications   Medication Sig Start Date End Date Taking? Authorizing Provider  albuterol (PROVENTIL HFA;VENTOLIN HFA) 108 (90 Base) MCG/ACT inhaler Inhale 2 puffs into the lungs every 6 (six) hours as needed for wheezing or shortness of breath.    [provider]  aspirin EC 81 MG tablet Take 1 tablet (81 mg total) by mouth daily. 05/10/16   Johnson, Clanford L, MD  diphenoxylate-atropine (LOMOTIL) 2.5-0.025 MG tablet Take 1 tablet 4 (four) times daily as needed by mouth for diarrhea or loose stools. 03/17/17   Evalee Jefferson, PA-C  LORazepam (ATIVAN) 1 MG tablet Take 1 tablet (1 mg total) by mouth 3 (three) times daily as needed for anxiety. Patient not taking: Reported on 12/20/2016 05/18/16   Carmin Muskrat, MD  nitroGLYCERIN (NITROSTAT) 0.4 MG SL tablet Place 1 tablet (0.4 mg total) under the tongue every 5 (five) minutes as needed for chest pain. 04/15/15   Herminio Commons, MD  ondansetron (ZOFRAN ODT) 8 MG disintegrating tablet Take 1 tablet (8 mg total) every 8 (eight) hours as needed by mouth for nausea or vomiting. 03/17/17   Evalee Jefferson, PA-C  OVER THE COUNTER MEDICATION Take 1 tablet by mouth as needed. Acid reducer she gets from [employer]    [provider]  potassium chloride SA (K-DUR,KLOR-CON) 20 MEQ tablet Take 1 tablet (20 mEq total) by mouth 2 (two) times daily. Patient not taking: Reported on 03/16/2017 12/20/16   Evalee Jefferson, PA-C  promethazine (PHENERGAN) 25 MG tablet Take 1 tablet (25 mg total) by mouth every 6 (six) hours as needed for nausea or vomiting. Patient not taking: Reported on 03/16/2017 12/20/16   Evalee Jefferson, PA-C  ranolazine (RANEXA) 500 MG 12 hr tablet Take 1 tablet (500 mg total) by mouth 2 (two) times daily. 05/10/16   Johnson, Clanford L, MD  SPIRIVA RESPIMAT 2.5 MCG/ACT AERS Inhale 1 puff into the lungs 2 (two) times daily.  07/27/14   [provider]  triazolam (HALCION) 0.25  MG tablet Take 1-1.5 tablets by mouth at bedtime as needed for sleep. 10/31/16   [provider]    Family History Family History  Problem Relation Age of Onset  . Cancer Mother   . Heart failure Mother   . Hypertension Mother   . Heart attack Mother   . Cancer Father   . Heart failure Father   .  Diabetes Father   . Heart attack Father 90    Social History Social History   Tobacco Use  . Smoking status: Current Some Day Smoker    Packs/day: 0.50    Years: 20.00    Pack years: 10.00    Types: Cigarettes    Start date: 09/26/1984  . Smokeless tobacco: Never Used  Substance Use Topics  . Alcohol use: No    Alcohol/week: 0.0 oz  . Drug use: No     Allergies   Codeine; Dilaudid [hydromorphone hcl]; Hydrocodone; Morphine and related; Oxycodone; Penicillins; Acetaminophen; Ativan [lorazepam]; Strawberry extract; Watermelon concentrate; Benadryl [diphenhydramine hcl]; and Latex   Review of Systems Review of Systems  Constitutional: Negative for activity change.       All ROS Neg except as noted in HPI  HENT: Negative for nosebleeds.   Eyes: Negative for photophobia and discharge.  Respiratory: Negative for cough, shortness of breath and wheezing.   Cardiovascular: Negative for chest pain and palpitations.  Gastrointestinal: Positive for vomiting. Negative for abdominal pain and blood in stool.  Genitourinary: Negative for dysuria, frequency and hematuria.  Musculoskeletal: Negative for arthralgias, back pain and neck pain.  Skin: Negative.   Neurological: Positive for dizziness and headaches. Negative for seizures and speech difficulty.  Psychiatric/Behavioral: Negative for confusion and hallucinations. The patient is nervous/anxious.      Physical Exam Updated Vital Signs Wt 65.8 kg (145 lb)   BMI 24.13 kg/m   Physical Exam  Constitutional: She appears well-developed and well-nourished. No distress.  HENT:  Head: Normocephalic and atraumatic.  Right  Ear: External ear normal.  Left Ear: External ear normal.  Eyes: Conjunctivae are normal. Right eye exhibits no discharge. Left eye exhibits no discharge. No scleral icterus.  Neck: Neck supple. No tracheal deviation present.  Cardiovascular: Normal rate, regular rhythm and intact distal pulses.  Pulmonary/Chest: Effort normal and breath sounds normal. No stridor. No respiratory distress. She has no wheezes. She has no rales.  Abdominal: Soft. Bowel sounds are normal. She exhibits no distension. There is no tenderness. There is no rebound and no guarding.  Musculoskeletal: She exhibits no edema or tenderness.  Neurological: She is alert. She has normal strength. No cranial nerve deficit (no facial droop, extraocular movements intact, no slurred speech) or sensory deficit. She exhibits normal muscle tone. She displays no seizure activity. Coordination normal.  Skin: Skin is warm and dry. No rash noted.  Nursing note and vitals reviewed.    ED Treatments / Results  Labs (all labs ordered are listed, but only abnormal results are displayed) Labs Reviewed - No data to display  EKG None  Radiology No results found.  Procedures Procedures (including critical care time)  Medications Ordered in ED Medications - No data to display   Initial Impression / Assessment and Plan / ED Course  I have reviewed the triage vital signs and the nursing notes.  Pertinent labs & imaging results that were available during my care of the patient were reviewed by me and considered in my medical decision making (see chart for details).       Final Clinical Impressions(s) / ED Diagnoses MDM  Vital signs reviewed.  Pulse oximetry is 98% on room air.  Within normal limits by my interpretation.  Patient has not had any more dizziness since the episode noted just prior to admission to the emergency department.  The patient's blood sugar is well within normal limits at 107.  The orthostatic blood  pressure checks within  normal limits.  The electrocardiogram is negative for acute changes, or life-threatening arrhythmias.  Patient states that she is been having some issues with family members in her home, and that she may have just gotten too upset and had a anxiety or panic related attack.  I have asked the patient to discuss this with Dr. Nevada Crane.  The pastor to return to the emergency department if any changes in condition, problems, or concerns.  The patient denies any suicidal or homicidal ideations at this time.   Final diagnoses:  Anxiety    ED Discharge Orders    None       Lily Kocher, [name] 10/24/17 2205    Rolland Porter, MD 10/28/17 2258

## 2017-10-24 NOTE — Discharge Instructions (Addendum)
Your vital signs are within normal limits.  Your orthostatic blood pressure checks are negative for volume depletion or other abnormality.  Your electrocardiogram is negative for acute changes.  Your dizziness is probably related to stress and anxiety.  Please discuss this with Dr. Nevada Crane.  Please increase your fluids.  Please return to the emergency department if any changes in your condition, worsening of your symptoms, problems or concerns.

## 2017-12-13 DIAGNOSIS — G4700 Insomnia, unspecified: Secondary | ICD-10-CM | POA: Insufficient documentation

## 2017-12-13 DIAGNOSIS — G629 Polyneuropathy, unspecified: Secondary | ICD-10-CM | POA: Insufficient documentation

## 2018-06-17 DIAGNOSIS — I10 Essential (primary) hypertension: Secondary | ICD-10-CM | POA: Insufficient documentation

## 2019-07-30 ENCOUNTER — Emergency Department (HOSPITAL_COMMUNITY)
Admission: EM | Admit: 2019-07-30 | Discharge: 2019-07-30 | Disposition: A | Discharge status: Home / Self Care | Payer: Self-pay | Attending: Emergency Medicine | Admitting: Emergency Medicine

## 2019-07-30 ENCOUNTER — Other Ambulatory Visit: Payer: Self-pay

## 2019-07-30 ENCOUNTER — Encounter (HOSPITAL_COMMUNITY): Payer: Self-pay

## 2019-07-30 ENCOUNTER — Emergency Department (HOSPITAL_COMMUNITY): Payer: Self-pay

## 2019-07-30 DIAGNOSIS — I110 Hypertensive heart disease with heart failure: Secondary | ICD-10-CM | POA: Insufficient documentation

## 2019-07-30 DIAGNOSIS — I509 Heart failure, unspecified: Secondary | ICD-10-CM | POA: Insufficient documentation

## 2019-07-30 DIAGNOSIS — E119 Type 2 diabetes mellitus without complications: Secondary | ICD-10-CM | POA: Insufficient documentation

## 2019-07-30 DIAGNOSIS — F17210 Nicotine dependence, cigarettes, uncomplicated: Secondary | ICD-10-CM | POA: Insufficient documentation

## 2019-07-30 DIAGNOSIS — R197 Diarrhea, unspecified: Secondary | ICD-10-CM | POA: Insufficient documentation

## 2019-07-30 DIAGNOSIS — R112 Nausea with vomiting, unspecified: Secondary | ICD-10-CM | POA: Insufficient documentation

## 2019-07-30 DIAGNOSIS — Z91040 Latex allergy status: Secondary | ICD-10-CM | POA: Insufficient documentation

## 2019-07-30 DIAGNOSIS — F1210 Cannabis abuse, uncomplicated: Secondary | ICD-10-CM | POA: Insufficient documentation

## 2019-07-30 DIAGNOSIS — Z79899 Other long term (current) drug therapy: Secondary | ICD-10-CM | POA: Insufficient documentation

## 2019-07-30 DIAGNOSIS — F1310 Sedative, hypnotic or anxiolytic abuse, uncomplicated: Secondary | ICD-10-CM | POA: Insufficient documentation

## 2019-07-30 DIAGNOSIS — J449 Chronic obstructive pulmonary disease, unspecified: Secondary | ICD-10-CM | POA: Insufficient documentation

## 2019-07-30 DIAGNOSIS — R569 Unspecified convulsions: Secondary | ICD-10-CM | POA: Insufficient documentation

## 2019-07-30 LAB — COMPREHENSIVE METABOLIC PANEL
ALT: <5 U/L (ref 0–44)
AST: 27 U/L (ref 15–41)
Albumin: 4.8 g/dL (ref 3.5–5.0)
Alkaline Phosphatase: 69 U/L (ref 38–126)
Anion gap: 18 — ABNORMAL HIGH (ref 5–15)
BUN: 10 mg/dL (ref 6–20)
CO2: 18 mmol/L — ABNORMAL LOW (ref 22–32)
Calcium: 9.8 mg/dL (ref 8.9–10.3)
Chloride: 98 mmol/L (ref 98–111)
Creatinine, Ser: 0.96 mg/dL (ref 0.44–1.00)
GFR calc Af Amer: >60 mL/min (ref >60)
GFR calc non Af Amer: >60 mL/min (ref >60)
Glucose, Bld: 198 mg/dL — ABNORMAL HIGH (ref 70–99)
Potassium: 3.8 mmol/L (ref 3.5–5.1)
Sodium: 134 mmol/L — ABNORMAL LOW (ref 135–145)
Total Bilirubin: 0.6 mg/dL (ref 0.3–1.2)
Total Protein: 8.2 g/dL — ABNORMAL HIGH (ref 6.5–8.1)

## 2019-07-30 LAB — URINALYSIS, ROUTINE W REFLEX MICROSCOPIC
Bilirubin Urine: NEGATIVE
Glucose, UA: NEGATIVE mg/dL
Ketones, ur: NEGATIVE mg/dL
Leukocytes,Ua: NEGATIVE
Nitrite: NEGATIVE
Protein, ur: 100 mg/dL — AB
Specific Gravity, Urine: 1.018 (ref 1.005–1.030)
pH: 5 (ref 5.0–8.0)

## 2019-07-30 LAB — CBC WITH DIFFERENTIAL/PLATELET
Abs Immature Granulocytes: 0.06 10*3/uL (ref 0.00–0.07)
Basophils Absolute: 0 10*3/uL (ref 0.0–0.1)
Basophils Relative: 0 %
Eosinophils Absolute: 0 10*3/uL (ref 0.0–0.5)
Eosinophils Relative: 0 %
HCT: 48.5 % — ABNORMAL HIGH (ref 36.0–46.0)
Hemoglobin: 15.8 g/dL — ABNORMAL HIGH (ref 12.0–15.0)
Immature Granulocytes: 0 %
Lymphocytes Relative: 6 %
Lymphs Abs: 0.9 10*3/uL (ref 0.7–4.0)
MCH: 30.4 pg (ref 26.0–34.0)
MCHC: 32.6 g/dL (ref 30.0–36.0)
MCV: 93.4 fL (ref 80.0–100.0)
Monocytes Absolute: 0.7 10*3/uL (ref 0.1–1.0)
Monocytes Relative: 5 %
Neutro Abs: 13.6 10*3/uL — ABNORMAL HIGH (ref 1.7–7.7)
Neutrophils Relative %: 89 %
Platelets: 287 10*3/uL (ref 150–400)
RBC: 5.19 MIL/uL — ABNORMAL HIGH (ref 3.87–5.11)
RDW: 12.7 % (ref 11.5–15.5)
WBC: 15.3 10*3/uL — ABNORMAL HIGH (ref 4.0–10.5)
nRBC: 0 % (ref 0.0–0.2)

## 2019-07-30 LAB — POC URINE PREG, ED: Preg Test, Ur: NEGATIVE

## 2019-07-30 LAB — ETHANOL: Alcohol, Ethyl (B): <10 mg/dL (ref <10)

## 2019-07-30 LAB — RAPID URINE DRUG SCREEN, HOSP PERFORMED
Amphetamines: NOT DETECTED
Barbiturates: NOT DETECTED
Benzodiazepines: POSITIVE — AB
Cocaine: NOT DETECTED
Opiates: NOT DETECTED
Tetrahydrocannabinol: POSITIVE — AB

## 2019-07-30 LAB — CBG MONITORING, ED: Glucose-Capillary: 206 mg/dL — ABNORMAL HIGH (ref 70–99)

## 2019-07-30 LAB — MAGNESIUM: Magnesium: 2.5 mg/dL — ABNORMAL HIGH (ref 1.7–2.4)

## 2019-07-30 MED ORDER — SODIUM CHLORIDE 0.9 % IV SOLN: INTRAVENOUS | Status: DC

## 2019-07-30 MED ORDER — SODIUM CHLORIDE 0.9 % IV BOLUS
1000.0000 mL | Freq: Once | INTRAVENOUS | Status: AC
  Administered 2019-07-30: 1000 mL via INTRAVENOUS

## 2019-07-30 MED ORDER — PROMETHAZINE HCL 12.5 MG PO TABS
25.0000 mg | ORAL_TABLET | Freq: Once | ORAL | Status: AC
  Administered 2019-07-30: 14:00:00 25 mg via ORAL
  Filled 2019-07-30: qty 2

## 2019-07-30 MED ORDER — PROMETHAZINE HCL 25 MG PO TABS: 25.0000 mg | ORAL_TABLET | Freq: Four times a day (QID) | ORAL | 0 refills | Status: DC | PRN

## 2019-07-30 NOTE — ED Triage Notes (Signed)
EMS reports Family said pt has had n/v x 2 days.  Family witnessed pt having seizure in a recliner this morning.  Reports episode lasted 2 min.  EMS says pt was conscious and alert when they arrived.  VSS, hr 114 and reports feels hot to touch.  EMS started 18g in r ac.  CBG 148.  EMS says pt thinks may have had  A seizure in the past but says it was never comfirmed.

## 2019-07-30 NOTE — Discharge Instructions (Signed)
Your testing shows no abnormalities with your brain, your blood work was rather unremarkable, your urinalysis showed that you were slightly dehydrated We have given you some IV fluids You have not had any further seizures You will need to follow-up with the local neurologist to have an EEG You should not drive until you have been cleared by the neurologist, this is a legal issue, technically under the law of the state of [place] you are not allowed to drive until you are cleared Seek medical exam for ongoing or worsening seizures  [facility] Doctor List    Sinda Du MD. Specialty: Pulmonary Disease Contact information: [place]  [place] [address]  [phone]   Tula Nakayama, MD. Specialty: Family Medicine Contact information: [address]  [place] [address]  [phone]   Sallee Lange, MD. Specialty: Family Medicine Contact information: [place]  [place] [address]  [phone]   Rosita Fire, MD Specialty: Internal Medicine Contact information: [place] [address]  [phone]   Delphina Cahill, MD. Specialty: Internal Medicine Contact information: [place] [address]  [phone]    [facility] (Dr. Maudie Mercury) Specialty: Family Medicine Contact information: [place] [address]  [phone]   Leslie Andrea, MD. Specialty: Family Medicine Contact information: [place] [address]  [phone]   Asencion Noble, MD. Specialty: Internal Medicine Contact information: [address]  [place] [address]  [place]  [address] [place],  [address] [phone]  Services [facility] offers a variety of basic health services.  Services include but are not limited  to: Blood pressure checks  Heart rate checks  Blood sugar checks  Urine analysis  Rapid strep tests  Pregnancy tests.  Health education and referrals  People needing more complex services will be directed to a physician online. Using these virtual visits, doctors can evaluate and prescribe medicine and treatments. There will be no medication on-site, though [facility] will help patients fill their prescriptions at little to no cost.   For More information please go to: [email or web address]

## 2019-07-30 NOTE — ED Provider Notes (Signed)
[facility] Provider Note   CSN: [id number] Arrival date & time: 07/30/19  1228     History Chief Complaint  Patient presents with  . Seizures    [name] is a 49 y.o. female.  HPI    This patient is a 49 year old female, she has a known history of multiple medical problems including Crohn's disease, it is listed that she has a collagen vascular disease, she has congestive heart failure, hypertension as well as posttraumatic stress disorder, continues to smoke half a pack per day of tobacco and does use marijuana but she denies any other drugs of abuse or alcohol use.  She has been told in the past that there is a family history of liver disease and was told never to take Tylenol or drink alcohol due to that.  The patient has evidently been in her usual state of health until 48 hours ago when she started to have increasing nausea and vomiting with some occasional diarrhea, has been feeling generally poorly, woke up today and was feeling a little bit better was sitting in a recliner when her grandchildren noticed that she was vomiting leaning over the side of the recliner.  The daughter was called to her side immediately by one of the grandchildren and found her to be unresponsive with her eyes rolled back with vomit on her face.  She eventually went to the ground and continued to have some intermittent abnormal jerking and seizure-like activity although she states that some of this was with her in the sitting position, some in the laying position, she called 911.  At one point the patient tried to communicate with her to not call 911 but then became unresponsive afterwards.  The patient recalls very little of this.  She is somnolent on arrival and having difficulty answering questions.  Paramedics reported vital signs that were normal but a depressed mental status in route.  CBG was normal.  No obvious headaches, fever, stiff neck, there was no reports of chest pain  coughing shortness of breath or abdominal pain according to the daughter who I personally spoke with on the phone to get additional history.  I have reviewed the medical record, the patient has been seen for anxiety in the emergency department, history of gastroenteritis, history of depression and a history of chest pain within the last several years.  She had a myocardial perfusion test in April 2016 which showed no reversible ischemia, normal left ventricular motion, normal ejection fraction, low risk stress test.  She had a CT scan of the brain that was performed in May 2017 due to vomiting and numbness in the arms legs and face which was negative at that time for any acute findings.  Past Medical History:  Diagnosis Date  . Anxiety   . Asthma   . Chest pain    normal stress echo 05/2007  . CHF (congestive heart failure) ([place])   . Chronic low back pain   . Closed head injury    memory disturbance  . Collagen vascular disease ([place])   . COPD (chronic obstructive pulmonary disease) ([place])   . Crohn's disease ([place])   . Diabetes mellitus without complication ([place])   . GERD (gastroesophageal reflux disease)    EGD 01/2007 by Dr.Rourke small hiatal hernia s/p 39 french maloney   . Hyperglycemia    fasting  . Hypertension   . IBS (irritable bowel syndrome)    chronic constipation h/o colon polyps in 2002 at [facility] per  patient   . Palpitation    with tachycardia  . PONV (postoperative nausea and vomiting)   . PTSD (post-traumatic stress disorder)   . Tobacco abuse    25 yrs pack history 1/2 pack daily    Patient Active Problem List   Diagnosis Date Noted  . Chest pain at rest 05/09/2016  . COPD (chronic obstructive pulmonary disease) ([place])   . Crohn's disease ([place])   . Diabetes mellitus without complication ([place])   . Anxiety   . Tobacco abuse   . PTSD (post-traumatic stress disorder)   . Palpitation   . Unspecified constipation 09/20/2012  . Dyspnea 01/26/2012  . Paresthesia  11/25/2011  . Abdominal pain, chronic, epigastric 11/01/2011  . Abdominal pain, acute, periumbilical 42/35/3614  . GERD (gastroesophageal reflux disease)   . Hyperglycemia   . Chronic low back pain   . IBS (irritable bowel syndrome)   . Closed head injury   . TOBACCO ABUSE 07/13/2010  . Palpitations 07/13/2010  . Pleuritic chest pain 07/13/2010  . Anxiety state, unspecified 07/03/2008  . BACK PAIN, LUMBAR, CHRONIC 07/03/2008    Past Surgical History:  Procedure Laterality Date  . ABDOMINAL HYSTERECTOMY     with right salpingo oophorectomy 2005  . APPENDECTOMY  2006  . CESAREAN SECTION     1996  . CHOLECYSTECTOMY    . COLONOSCOPY  2010   Dr. Gala Romney; negative except for hemorrhoids  . ESOPHAGEAL DILATION N/A 10/25/2014   Procedure: ESOPHAGEAL DILATION;  Surgeon: Rogene Houston, MD;  [facility];  Service: Endoscopy;  Laterality: N/A;  . ESOPHAGOGASTRODUODENOSCOPY N/A 10/25/2014   Procedure: ESOPHAGOGASTRODUODENOSCOPY (EGD);  Surgeon: Rogene Houston, MD;  Location: [facility];  Service: Endoscopy;  Laterality: N/A;  1250  . OOPHORECTOMY     left for torsion and ovarian fibroma; uterine myoma resected 1995     OB History    Gravida  3   Para  1   Term  1   Preterm      AB  2   Living        SAB  2   TAB      Ectopic      Multiple      Live Births              Family History  Problem Relation Age of Onset  . Cancer Mother   . Heart failure Mother   . Hypertension Mother   . Heart attack Mother   . Cancer Father   . Heart failure Father   . Diabetes Father   . Heart attack Father 67    Social History   Tobacco Use  . Smoking status: Current Some Day Smoker    Packs/day: 0.50    Years: 20.00    Pack years: 10.00    Types: Cigarettes    Start date: 09/26/1984  . Smokeless tobacco: Never Used  Substance Use Topics  . Alcohol use: No    Alcohol/week: 0.0 standard drinks  . Drug use: Yes    Types: Marijuana    Home  Medications Prior to Admission medications   Medication Sig Start Date End Date Taking? Authorizing Provider  ALPRAZolam [name]) 1 MG tablet Take 1 mg by mouth 3 (three) times daily as needed. 07/11/19  Yes [provider]  cetirizine (ZYRTEC) 10 MG tablet Take 10 mg by mouth daily. 03/30/19  Yes [provider]  cyclobenzaprine (FLEXERIL) 10 MG tablet Take 10 mg by mouth 3 (  three) times daily as needed. 07/11/19  Yes [provider]  diphenoxylate-atropine (LOMOTIL) 2.5-0.025 MG tablet Take 1 tablet 4 (four) times daily as needed by mouth for diarrhea or loose stools. 03/17/17  Yes Idol, Almyra Free, PA-C  hydrOXYzine (ATARAX/VISTARIL) 25 MG tablet Take 1 tablet by mouth at bedtime. 07/11/19  Yes [provider]  nitroGLYCERIN (NITROSTAT) 0.4 MG SL tablet Place 1 tablet (0.4 mg total) under the tongue every 5 (five) minutes as needed for chest pain. 04/15/15  Yes Herminio Commons, MD  omeprazole (PRILOSEC) 40 MG capsule Take 40 mg by mouth daily as needed.  07/21/19  Yes [provider]  ondansetron (ZOFRAN ODT) 8 MG disintegrating tablet Take 1 tablet (8 mg total) every 8 (eight) hours as needed by mouth for nausea or vomiting. 03/17/17  Yes Idol, Almyra Free, PA-C    Allergies    Codeine, Dilaudid [hydromorphone hcl], Hydrocodone, Morphine and related, Oxycodone, Penicillins, Acetaminophen, Ativan [lorazepam], Strawberry extract, Watermelon concentrate, Benadryl [diphenhydramine hcl], and Latex  Review of Systems   Review of Systems  Unable to perform ROS: Mental status change    Physical Exam Updated Vital Signs BP (!) 111/91   Pulse 83   Temp (!) 96.2 F (35.7 C) (Rectal)   Resp 20   SpO2 100%   Physical Exam Vitals and nursing note reviewed.  Constitutional:      General: She is not in acute distress.    Appearance: She is well-developed.     Comments: Somnolent, restful, opens her eyes to speech  HENT:     Head: Normocephalic and  atraumatic.     Mouth/Throat:     Pharynx: No oropharyngeal exudate.     Comments: No signs of tongue biting, mucous membranes appear dry Eyes:     General: No scleral icterus.       Right eye: No discharge.        Left eye: No discharge.     Conjunctiva/sclera: Conjunctivae normal.     Pupils: Pupils are equal, round, and reactive to light.  Neck:     Thyroid: No thyromegaly.     Vascular: No JVD.  Cardiovascular:     Rate and Rhythm: Normal rate and regular rhythm.     Heart sounds: Normal heart sounds. No murmur. No friction rub. No gallop.   Pulmonary:     Effort: Pulmonary effort is normal. No respiratory distress.     Breath sounds: Normal breath sounds. No wheezing or rales.  Abdominal:     General: Bowel sounds are normal. There is no distension.     Palpations: Abdomen is soft. There is no mass.     Tenderness: There is no abdominal tenderness.  Musculoskeletal:        General: No tenderness. Normal range of motion.     Cervical back: Normal range of motion and neck supple.  Lymphadenopathy:     Cervical: No cervical adenopathy.  Skin:    General: Skin is warm and dry.     Capillary Refill: Capillary refill takes less than 2 seconds.     Findings: No erythema or rash.  Neurological:     Coordination: Coordination normal.     Comments: Somnolent but arousable to voice, is able to follow commands but generally diffusely weak symmetrically weak.  Some memories to this morning are absent  Psychiatric:        Behavior: Behavior normal.     ED Results / Procedures / Treatments   Labs (all labs ordered  are listed, but only abnormal results are displayed) Labs Reviewed  CBC WITH DIFFERENTIAL/PLATELET - Abnormal; Notable for the following components:      Result Value   WBC 15.3 (*)    RBC 5.19 (*)    Hemoglobin 15.8 (*)    HCT 48.5 (*)    Neutro Abs 13.6 (*)    All other components within normal limits  COMPREHENSIVE METABOLIC PANEL - Abnormal; Notable for the  following components:   Sodium 134 (*)    CO2 18 (*)    Glucose, Bld 198 (*)    Total Protein 8.2 (*)    Anion gap 18 (*)    All other components within normal limits  MAGNESIUM - Abnormal; Notable for the following components:   Magnesium 2.5 (*)    All other components within normal limits  RAPID URINE DRUG SCREEN, HOSP PERFORMED - Abnormal; Notable for the following components:   Benzodiazepines POSITIVE (*)    Tetrahydrocannabinol POSITIVE (*)    All other components within normal limits  URINALYSIS, ROUTINE W REFLEX MICROSCOPIC - Abnormal; Notable for the following components:   APPearance CLOUDY (*)    Hgb urine dipstick SMALL (*)    Protein, ur 100 (*)    Bacteria, UA FEW (*)    All other components within normal limits  CBG MONITORING, ED - Abnormal; Notable for the following components:   Glucose-Capillary 206 (*)    All other components within normal limits  ETHANOL  CBG MONITORING, ED  POC URINE PREG, ED    EKG EKG Interpretation  Date/Time:  Monday July 30 2019 12:50:15 EDT Ventricular Rate:  87 PR Interval:    QRS Duration: 107 QT Interval:  423 QTC Calculation: 509 R Axis:   85 Text Interpretation: Sinus rhythm RAE, consider biatrial enlargement RSR' in V1 or V2, right VCD or RVH Borderline T abnormalities, anterior leads Borderline prolonged QT interval Since last tracing QT has lengthened Confirmed by Noemi Chapel [phone]) on 07/30/2019 12:58:45 PM   Radiology CT HEAD WO CONTRAST  Result Date: 07/30/2019 CLINICAL DATA:  Seizure EXAM: CT HEAD WITHOUT CONTRAST TECHNIQUE: Contiguous axial images were obtained from the base of the skull through the vertex without intravenous contrast. COMPARISON:  CT 09/14/2015 FINDINGS: Brain: No evidence of acute infarction, hemorrhage, hydrocephalus, extra-axial collection or mass lesion/mass effect. Vascular: Negative for hyperdense vessel Skull: Negative Sinuses/Orbits: Negative Other: None IMPRESSION: Negative CT head  Electronically Signed   By: Franchot Gallo M.D.   On: 07/30/2019 13:59    Procedures Procedures (including critical care time)  Medications Ordered in ED Medications  0.9 %  sodium chloride infusion ( Intravenous Stopped 07/30/19 1609)  promethazine (PHENERGAN) tablet 25 mg (25 mg Oral Given 07/30/19 1419)  sodium chloride 0.9 % bolus 1,000 mL (0 mLs Intravenous Stopped 07/30/19 1729)    ED Course  I have reviewed the triage vital signs and the nursing notes.  Pertinent labs & imaging results that were available during my care of the patient were reviewed by me and considered in my medical decision making (see chart for details).  Clinical Course as of Jul 30 1750  Mon Jul 30, 2019  1615 The patient was reevaluated at 415, she has a soft nontender abdomen, states that she is much less nauseated, now states that she did not sleep at all last night because of her nausea and vomiting, she has not had any further seizures in the emergency department and thus far her lab work and CT scan  have been rather unremarkable albeit mild hyperglycemia and a mild leukocytosis which is rather nonspecific.  Urinalysis pending, IV bolus given NS   [BM]    Clinical Course User Index [BM] Noemi Chapel, MD   MDM Rules/Calculators/A&P                      At this time it is not clear exactly what is causing this patient's seizure-like activity this morning, she appears somnolent and postictal at this time thus I fear that the seizure was likely something that occurred, she does report that she had a possible seizure in the past but this was never clarified.  She never received a formal diagnosis and has never been treated for seizures.  She does have Xanax listed on her medication list and is able to wake up enough to tell me that she does not use this regularly, she cannot recall the last time that she used it, she does not drink alcohol.  I personally viewed and interpreted her EKG, it appears to be sinus  rhythm, there is a mild QT prolongation of 509.  There is no signs of ischemia or arrhythmia.  The patient will need a CT scan of the brain, labs, seizure precautions  CT scan negative, labs unremarkable, the patient is otherwise well-appearing and now back to baseline.  She has been ambulatory, has been able to give a urine sample, and has no acute findings on her exam to suggest the need for admission further evaluation or stabilizing therapy with antiseizure medications.  She is agreeable to follow-up with neurology and has been given the seizure precautions  Final Clinical Impression(s) / ED Diagnoses Final diagnoses:  Seizure [facility])    Rx / DC Orders ED Discharge Orders    None       Noemi Chapel, MD 07/30/19 1752

## 2019-09-03 ENCOUNTER — Ambulatory Visit (HOSPITAL_COMMUNITY)
Admission: RE | Admit: 2019-09-03 | Discharge: 2019-09-03 | Disposition: A | Discharge status: Home / Self Care | Payer: Self-pay | Source: Ambulatory Visit | Attending: Neurology | Admitting: Neurology

## 2019-09-03 ENCOUNTER — Other Ambulatory Visit: Payer: Self-pay

## 2019-09-03 DIAGNOSIS — Z79899 Other long term (current) drug therapy: Secondary | ICD-10-CM | POA: Diagnosis not present

## 2019-09-03 DIAGNOSIS — R569 Unspecified convulsions: Secondary | ICD-10-CM | POA: Insufficient documentation

## 2019-09-03 NOTE — Progress Notes (Signed)
OP EEG completed, results pending.

## 2019-09-04 NOTE — Procedures (Signed)
  [place] A. 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  MEDICATIONS:  Current Outpatient Medications:  .  ALPRAZolam (XANAX) 1 MG tablet, Take 1 mg by mouth 3 (three) times daily as needed., Disp: , Rfl:  .  cetirizine (ZYRTEC) 10 MG tablet, Take 10 mg by mouth daily., Disp: , Rfl:  .  cyclobenzaprine (FLEXERIL) 10 MG tablet, Take 10 mg by mouth 3 (three) times daily as needed., Disp: , Rfl:  .  diphenoxylate-atropine (LOMOTIL) 2.5-0.025 MG tablet, Take 1 tablet 4 (four) times daily as needed by mouth for diarrhea or loose stools., Disp: 15 tablet, Rfl: 0 .  hydrOXYzine (ATARAX/VISTARIL) 25 MG tablet, Take 1 tablet by mouth at bedtime., Disp: , Rfl:  .  nitroGLYCERIN (NITROSTAT) 0.4 MG SL tablet, Place 1 tablet (0.4 mg total) under the tongue every 5 (five) minutes as needed for chest pain., Disp: 25 tablet, Rfl: 3 .  omeprazole (PRILOSEC) 40 MG capsule, Take 40 mg by mouth daily as needed. , Disp: , Rfl:  .  ondansetron (ZOFRAN ODT) 8 MG disintegrating tablet, Take 1 tablet (8 mg total) every 8 (eight) hours as needed by mouth for nausea or vomiting., Disp: 15 tablet, Rfl: 0 .  promethazine (PHENERGAN) 25 MG tablet, Take 1 tablet (25 mg total) by mouth every 6 (six) hours as needed for nausea or vomiting., Disp: 12 tablet, Rfl: 0     ANALYSIS: A 16 channel recording using standard 10 20 measurements is conducted for 23 minutes.  There is a well-formed posterior dominant rhythm of 12 Hz which attenuates with eye opening.  There is beta activity observed in the frontal areas.  Awake and drowsy architecture are observed.  Photic stimulation is carried out without abnormal changes in the background activity.  There is no focal or lateral slowing.  There is no epileptiform activity is noted.   IMPRESSION: 1.  This is a normal recording  of awake and drowsy states.      Harriet Bollen A. Merlene Laughter, M.D.  Diplomate, Tax adviser of Psychiatry and Neurology ( Neurology).

## 2020-01-11 DIAGNOSIS — G8929 Other chronic pain: Secondary | ICD-10-CM | POA: Insufficient documentation

## 2020-01-11 DIAGNOSIS — M25562 Pain in left knee: Secondary | ICD-10-CM | POA: Insufficient documentation

## 2020-02-07 DIAGNOSIS — R079 Chest pain, unspecified: Secondary | ICD-10-CM | POA: Insufficient documentation

## 2020-03-24 DIAGNOSIS — F329 Major depressive disorder, single episode, unspecified: Secondary | ICD-10-CM | POA: Insufficient documentation

## 2020-06-24 DIAGNOSIS — R569 Unspecified convulsions: Secondary | ICD-10-CM | POA: Insufficient documentation

## 2020-06-24 DIAGNOSIS — G40909 Epilepsy, unspecified, not intractable, without status epilepticus: Secondary | ICD-10-CM | POA: Insufficient documentation

## 2020-09-11 ENCOUNTER — Emergency Department (HOSPITAL_COMMUNITY)
Admission: EM | Admit: 2020-09-11 | Discharge: 2020-09-11 | Disposition: A | Discharge status: Home / Self Care | Payer: 59 | Attending: Emergency Medicine | Admitting: Emergency Medicine

## 2020-09-11 ENCOUNTER — Encounter (HOSPITAL_COMMUNITY): Payer: Self-pay

## 2020-09-11 ENCOUNTER — Other Ambulatory Visit: Payer: Self-pay

## 2020-09-11 ENCOUNTER — Emergency Department (HOSPITAL_COMMUNITY): Payer: 59

## 2020-09-11 DIAGNOSIS — R079 Chest pain, unspecified: Secondary | ICD-10-CM | POA: Insufficient documentation

## 2020-09-11 DIAGNOSIS — Z5321 Procedure and treatment not carried out due to patient leaving prior to being seen by health care provider: Secondary | ICD-10-CM | POA: Diagnosis not present

## 2020-09-11 DIAGNOSIS — R110 Nausea: Secondary | ICD-10-CM | POA: Insufficient documentation

## 2020-09-11 DIAGNOSIS — R0789 Other chest pain: Secondary | ICD-10-CM

## 2020-09-11 LAB — BASIC METABOLIC PANEL
Anion gap: 10 (ref 5–15)
BUN: 11 mg/dL (ref 6–20)
CO2: 29 mmol/L (ref 22–32)
Calcium: 9.9 mg/dL (ref 8.9–10.3)
Chloride: 99 mmol/L (ref 98–111)
Creatinine, Ser: 0.97 mg/dL (ref 0.44–1.00)
GFR, Estimated: >60 mL/min (ref >60)
Glucose, Bld: 93 mg/dL (ref 70–99)
Potassium: 3.9 mmol/L (ref 3.5–5.1)
Sodium: 138 mmol/L (ref 135–145)

## 2020-09-11 LAB — CBC
HCT: 46.2 % — ABNORMAL HIGH (ref 36.0–46.0)
Hemoglobin: 15.1 g/dL — ABNORMAL HIGH (ref 12.0–15.0)
MCH: 31.1 pg (ref 26.0–34.0)
MCHC: 32.7 g/dL (ref 30.0–36.0)
MCV: 95.1 fL (ref 80.0–100.0)
Platelets: 434 10*3/uL — ABNORMAL HIGH (ref 150–400)
RBC: 4.86 MIL/uL (ref 3.87–5.11)
RDW: 12.9 % (ref 11.5–15.5)
WBC: 9 10*3/uL (ref 4.0–10.5)
nRBC: 0 % (ref 0.0–0.2)

## 2020-09-11 LAB — HEPATIC FUNCTION PANEL
ALT: 38 U/L (ref 0–44)
AST: 42 U/L — ABNORMAL HIGH (ref 15–41)
Albumin: 4.1 g/dL (ref 3.5–5.0)
Alkaline Phosphatase: 111 U/L (ref 38–126)
Bilirubin, Direct: <0.1 mg/dL (ref 0.0–0.2)
Total Bilirubin: 0.4 mg/dL (ref 0.3–1.2)
Total Protein: 8.2 g/dL — ABNORMAL HIGH (ref 6.5–8.1)

## 2020-09-11 LAB — TROPONIN I (HIGH SENSITIVITY): Troponin I (High Sensitivity): <2 ng/L (ref <18)

## 2020-09-11 LAB — PROTIME-INR
INR: 1 (ref 0.8–1.2)
Prothrombin Time: 12.7 seconds (ref 11.4–15.2)

## 2020-09-11 NOTE — ED Triage Notes (Signed)
Pt presents to ED with complaints of chest pain under left breast radiating to left arm and up left side of neck started this am.

## 2020-09-11 NOTE — ED Notes (Signed)
Pt returned to ED to have IV removed at this time. IV removed.

## 2020-09-11 NOTE — ED Notes (Signed)
Pt not in room at this time. Pt gown on bed. IV not noted in room. Pt called and informed needed to take pt IV out. Pt states she will return to the ED to have her IV removed.

## 2020-11-14 ENCOUNTER — Encounter: Payer: Self-pay | Admitting: Cardiology

## 2020-11-14 ENCOUNTER — Ambulatory Visit: Payer: 59 | Admitting: Cardiology

## 2020-11-14 NOTE — Progress Notes (Deleted)
Cardiology Office Note  Date: 11/14/2020   ID: [name], DOB 1970-07-04, MRN [id number]  PCP:  Christine Kil, PA-C  Cardiologist:  None Electrophysiologist:  None   No chief complaint on file.   History of Present Illness: [name] is a 50 y.o. female referred for cardiology consultation by [name] to reestablish cardiology follow-up.  I reviewed the available records and updated the chart. She was followed by Dr. Bronson Ing in the past, was last seen by Ms. Lawrence DNP back in 2018, and more recently by Dr. Reesa Chew with [facility] in October 2021.  She has a history of recurrent chest pain with negative ischemic testing over time and also PSVT treated with beta-blocker.  She underwent a recent exercise Myoview through [place] in February of this year that was negative for ischemia and showed normal LVEF.   Past Medical History:  Diagnosis Date   Anxiety    Asthma    Chronic low back pain    Collagen vascular disease (HCC)    COPD (chronic obstructive pulmonary disease) (HCC)    Crohn's disease (HCC)    GERD (gastroesophageal reflux disease)    EGD 01/2007 by Dr.Rourke small hiatal hernia s/p 56 french maloney    History of head injury    Hypertension    IBS (irritable bowel syndrome)    PSVT (paroxysmal supraventricular tachycardia) (HCC)    PTSD (post-traumatic stress disorder)    Recurrent chest pain    Seizure disorder ([place])    Type 2 diabetes mellitus ([place])     Past Surgical History:  Procedure Laterality Date   ABDOMINAL HYSTERECTOMY     with right salpingo oophorectomy 2005   APPENDECTOMY  2006   CESAREAN SECTION     1996   CHOLECYSTECTOMY     COLONOSCOPY  2010   Dr. Gala Romney; negative except for hemorrhoids   ESOPHAGEAL DILATION N/A 10/25/2014   Procedure: ESOPHAGEAL DILATION;  Surgeon: Rogene Houston, MD;  [facility];  Service: Endoscopy;  Laterality: N/A;   ESOPHAGOGASTRODUODENOSCOPY N/A 10/25/2014   Procedure:  ESOPHAGOGASTRODUODENOSCOPY (EGD);  Surgeon: Rogene Houston, MD;  [facility];  Service: Endoscopy;  Laterality: N/A;  1250   OOPHORECTOMY     left for torsion and ovarian fibroma; uterine myoma resected 1995    Current Outpatient Medications  Medication Sig Dispense Refill   ALPRAZolam (XANAX) 1 MG tablet Take 1 mg by mouth 3 (three) times daily as needed.     cetirizine (ZYRTEC) 10 MG tablet Take 10 mg by mouth daily.     cyclobenzaprine (FLEXERIL) 10 MG tablet Take 10 mg by mouth 3 (three) times daily as needed.     diphenoxylate-atropine (LOMOTIL) 2.5-0.025 MG tablet Take 1 tablet 4 (four) times daily as needed by mouth for diarrhea or loose stools. 15 tablet 0   hydrOXYzine (ATARAX/VISTARIL) 25 MG tablet Take 1 tablet by mouth at bedtime.     nitroGLYCERIN (NITROSTAT) 0.4 MG SL tablet Place 1 tablet (0.4 mg total) under the tongue every 5 (five) minutes as needed for chest pain. 25 tablet 3   omeprazole (PRILOSEC) 40 MG capsule Take 40 mg by mouth daily as needed.      ondansetron (ZOFRAN ODT) 8 MG disintegrating tablet Take 1 tablet (8 mg total) every 8 (eight) hours as needed by mouth for nausea or vomiting. 15 tablet 0   promethazine (PHENERGAN) 25 MG tablet Take 1 tablet (25 mg total) by mouth every 6 (six)  hours as needed for nausea or vomiting. 12 tablet 0   No current facility-administered medications for this visit.   Allergies:  Codeine, Dilaudid [hydromorphone hcl], Hydrocodone, Morphine and related, Oxycodone, Penicillins, Acetaminophen, Ativan [lorazepam], Strawberry extract, Watermelon concentrate, Benadryl [diphenhydramine hcl], and Latex   Social History: The patient  reports that she has been smoking cigarettes. She started smoking about 36 years ago. She has a 10.00 pack-year smoking history. She has never used smokeless tobacco. She reports previous drug use. She reports that she does not drink alcohol.   Family History: The patient's family history includes  Cancer in her father and mother; Diabetes in her father; Heart attack in her mother; Heart attack (age of onset: 45) in her father; Heart failure in her father and mother; Hypertension in her mother.   ROS:  Please see the history of present illness. Otherwise, complete review of systems is positive for {NONE DEFAULTED:18576}.  All other systems are reviewed and negative.   Physical Exam: VS:  There were no vitals taken for this visit., BMI There is no height or weight on file to calculate BMI.  Wt Readings from Last 3 Encounters:  09/11/20 135 lb (61.2 kg)  10/23/17 145 lb (65.8 kg)  03/16/17 145 lb (65.8 kg)    General: Patient appears comfortable at rest. HEENT: Conjunctiva and lids normal, oropharynx clear with moist mucosa. Neck: Supple, no elevated JVP or carotid bruits, no thyromegaly. Lungs: Clear to auscultation, nonlabored breathing at rest. Cardiac: Regular rate and rhythm, no S3 or significant systolic murmur, no pericardial rub. Abdomen: Soft, nontender, no hepatomegaly, bowel sounds present, no guarding or rebound. Extremities: No pitting edema, distal pulses 2+. Skin: Warm and dry. Musculoskeletal: No kyphosis. Neuropsychiatric: Alert and oriented x3, affect grossly appropriate.  ECG:  An ECG dated 09/11/2020 was personally reviewed today and demonstrated:  Sinus rhythm with R' in lead V1 and V2, possible left atrial enlargement.  Recent Labwork: 09/11/2020: ALT 38; AST 42; BUN 11; Creatinine, Ser 0.97; Hemoglobin 15.1; Platelets 434; Potassium 3.9; Sodium 138     Component Value Date/Time   CHOL 152 05/10/2016 0519   TRIG 90 05/10/2016 0519   HDL 50 05/10/2016 0519   CHOLHDL 3.0 05/10/2016 0519   VLDL 18 05/10/2016 0519   LDLCALC 84 05/10/2016 0519  November 2021: Cholesterol 203, triglycerides 175, HDL 51, LDL 121  Other Studies Reviewed Today:  Exercise Myoview 06/25/2020 ([facility]): 1. No evidence of inducible ischemia.  2. Normal left ventricular function.  3.  Fair exercise tolerance.   Assessment and Plan:    Medication Adjustments/Labs and Tests Ordered: Current medicines are reviewed at length with the patient today.  Concerns regarding medicines are outlined above.   Tests Ordered: No orders of the defined types were placed in this encounter.   Medication Changes: No orders of the defined types were placed in this encounter.   Disposition:  Follow up {follow up:15908}  Signed, Satira Sark, MD, [facility] 11/14/2020 9:07 AM    [facility] [place], [place], [place] [address] Phone: [phone]; Fax: [phone]

## 2021-01-06 ENCOUNTER — Encounter: Payer: Self-pay | Admitting: *Deleted

## 2021-01-07 ENCOUNTER — Ambulatory Visit (INDEPENDENT_AMBULATORY_CARE_PROVIDER_SITE_OTHER): Payer: 59 | Admitting: Cardiology

## 2021-01-07 ENCOUNTER — Other Ambulatory Visit: Payer: Self-pay | Admitting: Cardiology

## 2021-01-07 ENCOUNTER — Ambulatory Visit: Payer: 59

## 2021-01-07 ENCOUNTER — Encounter: Payer: Self-pay | Admitting: Cardiology

## 2021-01-07 VITALS — BP 108/76 | HR 88 | Ht 65.0 in | Wt 152.4 lb

## 2021-01-07 DIAGNOSIS — R002 Palpitations: Secondary | ICD-10-CM

## 2021-01-07 DIAGNOSIS — I471 Supraventricular tachycardia: Secondary | ICD-10-CM | POA: Diagnosis not present

## 2021-01-07 DIAGNOSIS — R0789 Other chest pain: Secondary | ICD-10-CM

## 2021-01-07 NOTE — Progress Notes (Signed)
Here today   Cardiology Office Note  Date: 01/07/2021   ID: [name], DOB [birth year], MRN [id number]  PCP:  Teodoro Kil, PA-C  Cardiologist:  Rozann Lesches, MD Electrophysiologist:  None   Chief Complaint  Patient presents with   Palpitations and chest pain    History of Present Illness: [name] is a 50 y.o. female referred for cardiology consultation by Ms. Worley PA-C for the evaluation of chest pain.  She is here today with significant other.  I reviewed the available records which are limited.  She reports having episodes of rapid heartbeat as well as intermittent chest discomfort over the last several months.  I see that she was seen in the ER back in March with concerns about altered mental status and possible seizure-like activity, however head CT showed no acute events and subsequent EEG was normal.  She also had evaluation through the [facility] prior to that with similarly reassuring neurological findings in February.  She was seen by cardiology in the past, previously Dr. Bronson Ing as of 2017-2018, and more recently through [place].  Exercise Myoview from 2018 was low risk as discussed below.  She just recently had an exercise myocardial perfusion study that was low risk without ischemia through the [place] system in February.  She achieved a maximum workload of 7 METS without diagnostic ST segment changes and perfusion imaging was normal with LVEF reported 86%.  There is mention of PSVT on review of history, she states that she used to be on metoprolol but stopped this when she lost insurance in the past.  She has not had a recent heart monitor.  Past Medical History:  Diagnosis Date   Anxiety    Asthma    Chronic low back pain    Collagen vascular disease (HCC)    COPD (chronic obstructive pulmonary disease) (HCC)    Crohn's disease (HCC)    GERD (gastroesophageal reflux disease)    EGD 01/2007 by Dr.Rourke small hiatal hernia s/p 56 french maloney     History of head injury    Hypertension    IBS (irritable bowel syndrome)    PSVT (paroxysmal supraventricular tachycardia) (HCC)    PTSD (post-traumatic stress disorder)    Recurrent chest pain    Seizure disorder ([place])    Type 2 diabetes mellitus ([place])     Past Surgical History:  Procedure Laterality Date   ABDOMINAL HYSTERECTOMY     with right salpingo oophorectomy 2005   APPENDECTOMY  2006   CESAREAN SECTION     1996   CHOLECYSTECTOMY     COLONOSCOPY  2010   Dr. Gala Romney; negative except for hemorrhoids   ESOPHAGEAL DILATION N/A 10/25/2014   Procedure: ESOPHAGEAL DILATION;  Surgeon: Rogene Houston, MD;  [facility];  Service: Endoscopy;  Laterality: N/A;   ESOPHAGOGASTRODUODENOSCOPY N/A 10/25/2014   Procedure: ESOPHAGOGASTRODUODENOSCOPY (EGD);  Surgeon: Rogene Houston, MD;  [facility];  Service: Endoscopy;  Laterality: N/A;  1250   OOPHORECTOMY     left for torsion and ovarian fibroma; uterine myoma resected 1995    Current Outpatient Medications  Medication Sig Dispense Refill   ALPRAZolam (XANAX) 1 MG tablet Take 1 mg by mouth 3 (three) times daily as needed.     amitriptyline (ELAVIL) 75 MG tablet Take 75 mg by mouth at bedtime.     cetirizine (ZYRTEC) 10 MG tablet Take 10 mg by mouth daily.     cyclobenzaprine (FLEXERIL) 10 MG tablet Take 10 mg  by mouth 3 (three) times daily as needed.     diphenoxylate-atropine (LOMOTIL) 2.5-0.025 MG tablet Take 1 tablet 4 (four) times daily as needed by mouth for diarrhea or loose stools. 15 tablet 0   hydrOXYzine (ATARAX/VISTARIL) 25 MG tablet Take 1 tablet by mouth at bedtime.     nitroGLYCERIN (NITROSTAT) 0.4 MG SL tablet Place 1 tablet (0.4 mg total) under the tongue every 5 (five) minutes as needed for chest pain. 25 tablet 3   omeprazole (PRILOSEC) 40 MG capsule Take 40 mg by mouth daily as needed.      ondansetron (ZOFRAN ODT) 8 MG disintegrating tablet Take 1 tablet (8 mg total) every 8 (eight) hours as  needed by mouth for nausea or vomiting. 15 tablet 0   pregabalin (LYRICA) 100 MG capsule Take 100 mg by mouth 3 (three) times daily.     promethazine (PHENERGAN) 25 MG tablet Take 1 tablet (25 mg total) by mouth every 6 (six) hours as needed for nausea or vomiting. 12 tablet 0   SPIRIVA RESPIMAT 2.5 MCG/ACT AERS SMARTSIG:2 Puff(s) Via Inhaler Daily     No current facility-administered medications for this visit.   Allergies:  Codeine, Dilaudid [hydromorphone hcl], Hydrocodone, Morphine and related, Oxycodone, Penicillins, Acetaminophen, Ativan [lorazepam], Strawberry extract, Watermelon concentrate, Benadryl [diphenhydramine hcl], and Latex   Social History: The patient  reports that she has been smoking cigarettes. She started smoking about 36 years ago. She has a 10.00 pack-year smoking history. She has never used smokeless tobacco. She reports that she does not currently use drugs. She reports that she does not drink alcohol.   Family History: The patient's family history includes Cancer in her father and mother; Diabetes in her father; Heart attack in her mother; Heart attack (age of onset: 90) in her father; Heart failure in her father and mother; Hypertension in her mother.   ROS: No orthopnea or PND.  Physical Exam: VS:  BP 108/76   Pulse 88   Ht 5' 5"  (1.651 m)   Wt 152 lb 6.4 oz (69.1 kg)   SpO2 96%   BMI 25.36 kg/m , BMI Body mass index is 25.36 kg/m.  Wt Readings from Last 3 Encounters:  01/07/21 152 lb 6.4 oz (69.1 kg)  09/11/20 135 lb (61.2 kg)  10/23/17 145 lb (65.8 kg)    General: Patient appears comfortable at rest. HEENT: Conjunctiva and lids normal, wearing a mask. Neck: Supple, no elevated JVP or carotid bruits, no thyromegaly. Lungs: Clear to auscultation, nonlabored breathing at rest. Cardiac: Regular rate and rhythm, no S3 or significant systolic murmur, no pericardial rub. Abdomen: Soft, nontender, bowel sounds present. Extremities: No pitting edema, distal  pulses 2+. Skin: Warm and dry. Musculoskeletal: No kyphosis. Neuropsychiatric: Alert and oriented x3, affect grossly appropriate.  ECG:  An ECG dated 09/11/2020 was personally reviewed today and demonstrated:  Sinus rhythm with left atrial enlargement, R' in lead V1 and V2.  Recent Labwork: 09/11/2020: ALT 38; AST 42; BUN 11; Creatinine, Ser 0.97; Hemoglobin 15.1; Platelets 434; Potassium 3.9; Sodium 138     Component Value Date/Time   CHOL 152 05/10/2016 0519   TRIG 90 05/10/2016 0519   HDL 50 05/10/2016 0519   CHOLHDL 3.0 05/10/2016 0519   VLDL 18 05/10/2016 0519   LDLCALC 84 05/10/2016 0519    Other Studies Reviewed Today:  Exercise myocardial perfusion study 06/25/2020 ([facility]): STRESS ECG RESULTS:   The patient's baseline EKG is noted to show sinus rhythm with nonspecific ST segment changes.  The patient's EKG during monitoring period showed nonischemic upper sloping ST segment depression.   Stress EKG patient exercised for a total of 3 1/2 minutes and achieved 7 metabolic equivalents. They achieved 94% of the age-predicted maximum heart rate. No ischemic changes noted.   IMAGING FINDINGS:   This is a technically adequate study.   EDV: 67m  ESV: 339m  The stress gated images revealed left ventricular ejection fraction of 86%% with normal wall motion.   Comparison of stress and rest images revealed no reversible defects by myocardial perfusion imaging.   IMPRESSION:   1. No evidence of inducible ischemia.  2. Normal left ventricular function.  3. Fair exercise tolerance.   Assessment and Plan:  1.  History of chest pain, patient underwent reassuring low risk exercise Myoview through the [facility] in February of this year.  Could potentially be experiencing symptoms related to PSVT based on review of history although she has not had any recent monitoring or documentation of arrhythmia.  2.  History of PSVT based on available information.  She had previously  been on beta-blocker.  Reports relatively frequent palpitations, sometimes in association with chest pain.  We will plan a 14-day Zio patch for further investigation and echocardiogram for cardiac structural assessment.  At this point not adding any new medications.  Medication Adjustments/Labs and Tests Ordered: Current medicines are reviewed at length with the patient today.  Concerns regarding medicines are outlined above.   Tests Ordered: Orders Placed This Encounter  Procedures   ECHOCARDIOGRAM COMPLETE     Medication Changes: No orders of the defined types were placed in this encounter.   Disposition:  Follow up  test results.  Signed, SaSatira SarkMD, FASurgicenter Of [facility]/10/2020 3:22 PM    [place] [id number]: [phone]: [phone]

## 2021-01-07 NOTE — Patient Instructions (Addendum)
Medication Instructions:  Your physician recommends that you continue on your current medications as directed. Please refer to the Current Medication list given to you today.  Labwork: none  Testing/Procedures: Your physician has requested that you have an echocardiogram. Echocardiography is a painless test that uses sound waves to create images of your heart. It provides your doctor with information about the size and shape of your heart and how well your heart's chambers and valves are working. This procedure takes approximately one hour. There are no restrictions for this procedure. ZIO- Long Term Monitor Instructions   Your physician has requested you wear your ZIO patch monitor 7 days.   This is a single patch monitor.  [employer] supplies one patch monitor per enrollment.  Additional stickers are not available.   Please do not apply patch if you will be having a Nuclear Stress Test, Echocardiogram, Cardiac CT, MRI, or Chest Xray during the time frame you would be wearing the monitor. The patch cannot be worn during these tests.  You cannot remove and re-apply the ZIO XT patch monitor.     Once you have received you monitor, please review enclosed instructions.  Your monitor has already been registered assigning a specific monitor serial # to you.   Applying the monitor   Shave hair from upper left chest.   Hold abrader disc by orange tab.  Rub abrader in 40 strokes over left upper chest as indicated in your monitor instructions.   Clean area with 4 enclosed alcohol pads .  Use all pads to assure are is cleaned thoroughly.  Let dry.   Apply patch as indicated in monitor instructions.  Patch will be place under collarbone on left side of chest with arrow pointing upward.   Rub patch adhesive wings for 2 minutes.Remove white label marked "1".  Remove white label marked "2".  Rub patch adhesive wings for 2 additional minutes.   While looking in a mirror, press and release button in  center of patch.  A small green light will flash 3-4 times .  This will be your only indicator the monitor has been turned on.     Do not shower for the first 24 hours.  You may shower after the first 24 hours.   Press button if you feel a symptom. You will hear a small click.  Record Date, Time and Symptom in the Patient Log Book.   When you are ready to remove patch, follow instructions on last 2 pages of Patient Log Book.  Stick patch monitor onto last page of Patient Log Book.   Place Patient Log Book in [place] box.  Use locking tab on box and tape box closed securely.  The [employer] and [employer] has [employer] on it.  Please place in mailbox as soon as possible.  Your physician should have your test results approximately 7 days after the monitor has been mailed back to [facility].   Call [place] at [phone] if you have questions regarding your ZIO XT patch monitor.  Call them immediately if you see an orange light blinking on your monitor.   If your monitor falls off in less than 4 days contact our Monitor department at [phone].  If your monitor becomes loose or falls off after 4 days call [employer] at [phone] for suggestions on securing your monitor.  Follow-Up: Your physician recommends that you schedule a follow-up appointment in: pending  Any Other Special Instructions Will Be Listed Below (If Applicable).  If you need a refill on your cardiac medications before your next appointment, please call your pharmacy.

## 2021-01-15 ENCOUNTER — Other Ambulatory Visit: Payer: Self-pay

## 2021-01-15 DIAGNOSIS — A419 Sepsis, unspecified organism: Principal | ICD-10-CM | POA: Diagnosis present

## 2021-01-15 DIAGNOSIS — M79671 Pain in right foot: Secondary | ICD-10-CM | POA: Diagnosis present

## 2021-01-15 DIAGNOSIS — K219 Gastro-esophageal reflux disease without esophagitis: Secondary | ICD-10-CM | POA: Diagnosis present

## 2021-01-15 DIAGNOSIS — G9341 Metabolic encephalopathy: Secondary | ICD-10-CM | POA: Diagnosis present

## 2021-01-15 DIAGNOSIS — J449 Chronic obstructive pulmonary disease, unspecified: Secondary | ICD-10-CM | POA: Diagnosis present

## 2021-01-15 DIAGNOSIS — N179 Acute kidney failure, unspecified: Secondary | ICD-10-CM | POA: Diagnosis not present

## 2021-01-15 DIAGNOSIS — E876 Hypokalemia: Secondary | ICD-10-CM | POA: Diagnosis present

## 2021-01-15 DIAGNOSIS — Z8249 Family history of ischemic heart disease and other diseases of the circulatory system: Secondary | ICD-10-CM

## 2021-01-15 DIAGNOSIS — G8929 Other chronic pain: Secondary | ICD-10-CM | POA: Diagnosis present

## 2021-01-15 DIAGNOSIS — Z888 Allergy status to other drugs, medicaments and biological substances status: Secondary | ICD-10-CM

## 2021-01-15 DIAGNOSIS — E860 Dehydration: Secondary | ICD-10-CM | POA: Diagnosis present

## 2021-01-15 DIAGNOSIS — M6282 Rhabdomyolysis: Secondary | ICD-10-CM | POA: Diagnosis present

## 2021-01-15 DIAGNOSIS — Z79899 Other long term (current) drug therapy: Secondary | ICD-10-CM

## 2021-01-15 DIAGNOSIS — R6521 Severe sepsis with septic shock: Secondary | ICD-10-CM | POA: Diagnosis present

## 2021-01-15 DIAGNOSIS — F4310 Post-traumatic stress disorder, unspecified: Secondary | ICD-10-CM | POA: Diagnosis present

## 2021-01-15 DIAGNOSIS — Z880 Allergy status to penicillin: Secondary | ICD-10-CM

## 2021-01-15 DIAGNOSIS — Z833 Family history of diabetes mellitus: Secondary | ICD-10-CM

## 2021-01-15 DIAGNOSIS — I313 Pericardial effusion (noninflammatory): Secondary | ICD-10-CM | POA: Diagnosis present

## 2021-01-15 DIAGNOSIS — Z91040 Latex allergy status: Secondary | ICD-10-CM

## 2021-01-15 DIAGNOSIS — Z885 Allergy status to narcotic agent status: Secondary | ICD-10-CM

## 2021-01-15 DIAGNOSIS — Z91018 Allergy to other foods: Secondary | ICD-10-CM

## 2021-01-15 DIAGNOSIS — A09 Infectious gastroenteritis and colitis, unspecified: Secondary | ICD-10-CM | POA: Diagnosis present

## 2021-01-15 DIAGNOSIS — M545 Low back pain, unspecified: Secondary | ICD-10-CM | POA: Diagnosis present

## 2021-01-15 DIAGNOSIS — Z28310 Unvaccinated for covid-19: Secondary | ICD-10-CM

## 2021-01-15 DIAGNOSIS — F17210 Nicotine dependence, cigarettes, uncomplicated: Secondary | ICD-10-CM | POA: Diagnosis present

## 2021-01-15 DIAGNOSIS — K5150 Left sided colitis without complications: Secondary | ICD-10-CM | POA: Diagnosis present

## 2021-01-15 DIAGNOSIS — F419 Anxiety disorder, unspecified: Secondary | ICD-10-CM | POA: Diagnosis present

## 2021-01-15 DIAGNOSIS — U071 COVID-19: Secondary | ICD-10-CM | POA: Diagnosis present

## 2021-01-15 DIAGNOSIS — G40909 Epilepsy, unspecified, not intractable, without status epilepticus: Secondary | ICD-10-CM | POA: Diagnosis present

## 2021-01-15 DIAGNOSIS — I10 Essential (primary) hypertension: Secondary | ICD-10-CM | POA: Diagnosis present

## 2021-01-15 DIAGNOSIS — E119 Type 2 diabetes mellitus without complications: Secondary | ICD-10-CM | POA: Diagnosis present

## 2021-01-15 DIAGNOSIS — E871 Hypo-osmolality and hyponatremia: Secondary | ICD-10-CM | POA: Diagnosis present

## 2021-01-15 DIAGNOSIS — E872 Acidosis: Secondary | ICD-10-CM | POA: Diagnosis present

## 2021-01-15 DIAGNOSIS — F13239 Sedative, hypnotic or anxiolytic dependence with withdrawal, unspecified: Secondary | ICD-10-CM | POA: Diagnosis present

## 2021-01-15 DIAGNOSIS — Z886 Allergy status to analgesic agent status: Secondary | ICD-10-CM

## 2021-01-15 NOTE — ED Triage Notes (Signed)
Pt states she was brought to ED tonight by stranger. Pt states she thinks she is having seizures at home. Pt states hx of seizures.

## 2021-01-16 ENCOUNTER — Emergency Department (HOSPITAL_COMMUNITY): Payer: 59

## 2021-01-16 ENCOUNTER — Inpatient Hospital Stay (HOSPITAL_COMMUNITY): Payer: 59

## 2021-01-16 ENCOUNTER — Observation Stay (HOSPITAL_COMMUNITY): Payer: 59

## 2021-01-16 ENCOUNTER — Observation Stay: Payer: Self-pay

## 2021-01-16 ENCOUNTER — Encounter (HOSPITAL_COMMUNITY): Payer: Self-pay | Admitting: Emergency Medicine

## 2021-01-16 ENCOUNTER — Other Ambulatory Visit: Payer: Self-pay

## 2021-01-16 ENCOUNTER — Inpatient Hospital Stay (HOSPITAL_COMMUNITY)
Admission: EM | Admit: 2021-01-16 | Discharge: 2021-01-24 | DRG: 871 | Disposition: A | Discharge status: Home Health | Payer: 59 | Attending: Family Medicine | Admitting: Family Medicine

## 2021-01-16 ENCOUNTER — Inpatient Hospital Stay (HOSPITAL_COMMUNITY)
Admit: 2021-01-16 | Discharge: 2021-01-16 | Disposition: A | Discharge status: Home / Self Care | Payer: 59 | Attending: Family Medicine | Admitting: Family Medicine

## 2021-01-16 DIAGNOSIS — E871 Hypo-osmolality and hyponatremia: Secondary | ICD-10-CM | POA: Diagnosis present

## 2021-01-16 DIAGNOSIS — R6520 Severe sepsis without septic shock: Secondary | ICD-10-CM

## 2021-01-16 DIAGNOSIS — I313 Pericardial effusion (noninflammatory): Secondary | ICD-10-CM | POA: Diagnosis present

## 2021-01-16 DIAGNOSIS — J449 Chronic obstructive pulmonary disease, unspecified: Secondary | ICD-10-CM | POA: Diagnosis present

## 2021-01-16 DIAGNOSIS — Z886 Allergy status to analgesic agent status: Secondary | ICD-10-CM | POA: Diagnosis not present

## 2021-01-16 DIAGNOSIS — I10 Essential (primary) hypertension: Secondary | ICD-10-CM | POA: Diagnosis present

## 2021-01-16 DIAGNOSIS — A419 Sepsis, unspecified organism: Secondary | ICD-10-CM | POA: Diagnosis present

## 2021-01-16 DIAGNOSIS — Z720 Tobacco use: Secondary | ICD-10-CM | POA: Diagnosis present

## 2021-01-16 DIAGNOSIS — M79671 Pain in right foot: Secondary | ICD-10-CM | POA: Diagnosis present

## 2021-01-16 DIAGNOSIS — R55 Syncope and collapse: Secondary | ICD-10-CM | POA: Diagnosis not present

## 2021-01-16 DIAGNOSIS — R571 Hypovolemic shock: Secondary | ICD-10-CM

## 2021-01-16 DIAGNOSIS — G9341 Metabolic encephalopathy: Secondary | ICD-10-CM | POA: Diagnosis not present

## 2021-01-16 DIAGNOSIS — U071 COVID-19: Secondary | ICD-10-CM | POA: Diagnosis present

## 2021-01-16 DIAGNOSIS — E872 Acidosis, unspecified: Secondary | ICD-10-CM | POA: Diagnosis present

## 2021-01-16 DIAGNOSIS — M545 Low back pain, unspecified: Secondary | ICD-10-CM | POA: Diagnosis present

## 2021-01-16 DIAGNOSIS — E860 Dehydration: Secondary | ICD-10-CM | POA: Diagnosis present

## 2021-01-16 DIAGNOSIS — R569 Unspecified convulsions: Secondary | ICD-10-CM | POA: Diagnosis not present

## 2021-01-16 DIAGNOSIS — Z880 Allergy status to penicillin: Secondary | ICD-10-CM | POA: Diagnosis not present

## 2021-01-16 DIAGNOSIS — R579 Shock, unspecified: Secondary | ICD-10-CM | POA: Diagnosis present

## 2021-01-16 DIAGNOSIS — K219 Gastro-esophageal reflux disease without esophagitis: Secondary | ICD-10-CM | POA: Diagnosis present

## 2021-01-16 DIAGNOSIS — M6282 Rhabdomyolysis: Secondary | ICD-10-CM | POA: Diagnosis present

## 2021-01-16 DIAGNOSIS — A09 Infectious gastroenteritis and colitis, unspecified: Secondary | ICD-10-CM | POA: Diagnosis present

## 2021-01-16 DIAGNOSIS — I3139 Other pericardial effusion (noninflammatory): Secondary | ICD-10-CM | POA: Diagnosis present

## 2021-01-16 DIAGNOSIS — E119 Type 2 diabetes mellitus without complications: Secondary | ICD-10-CM

## 2021-01-16 DIAGNOSIS — Z885 Allergy status to narcotic agent status: Secondary | ICD-10-CM | POA: Diagnosis not present

## 2021-01-16 DIAGNOSIS — K5150 Left sided colitis without complications: Secondary | ICD-10-CM | POA: Diagnosis present

## 2021-01-16 DIAGNOSIS — Z28310 Unvaccinated for covid-19: Secondary | ICD-10-CM | POA: Diagnosis not present

## 2021-01-16 DIAGNOSIS — R197 Diarrhea, unspecified: Secondary | ICD-10-CM | POA: Diagnosis not present

## 2021-01-16 DIAGNOSIS — N179 Acute kidney failure, unspecified: Secondary | ICD-10-CM | POA: Diagnosis present

## 2021-01-16 DIAGNOSIS — G40909 Epilepsy, unspecified, not intractable, without status epilepticus: Secondary | ICD-10-CM | POA: Diagnosis present

## 2021-01-16 DIAGNOSIS — F17210 Nicotine dependence, cigarettes, uncomplicated: Secondary | ICD-10-CM | POA: Diagnosis present

## 2021-01-16 DIAGNOSIS — Z91018 Allergy to other foods: Secondary | ICD-10-CM | POA: Diagnosis not present

## 2021-01-16 DIAGNOSIS — G8929 Other chronic pain: Secondary | ICD-10-CM | POA: Diagnosis present

## 2021-01-16 DIAGNOSIS — F13239 Sedative, hypnotic or anxiolytic dependence with withdrawal, unspecified: Secondary | ICD-10-CM | POA: Diagnosis present

## 2021-01-16 DIAGNOSIS — R6521 Severe sepsis with septic shock: Secondary | ICD-10-CM | POA: Diagnosis present

## 2021-01-16 HISTORY — DX: COVID-19: U07.1

## 2021-01-16 HISTORY — DX: Rhabdomyolysis: M62.82

## 2021-01-16 LAB — BLOOD GAS, VENOUS
Acid-base deficit: 9.6 mmol/L — ABNORMAL HIGH (ref 0.0–2.0)
Bicarbonate: 15.3 mmol/L — ABNORMAL LOW (ref 20.0–28.0)
FIO2: 21
O2 Saturation: 37.8 %
Patient temperature: 36.5
pCO2, Ven: 40.4 mmHg — ABNORMAL LOW (ref 44.0–60.0)
pH, Ven: 7.234 — ABNORMAL LOW (ref 7.250–7.430)
pO2, Ven: <31 mmHg — CL (ref 32.0–45.0)

## 2021-01-16 LAB — URINALYSIS, ROUTINE W REFLEX MICROSCOPIC
Bilirubin Urine: NEGATIVE
Glucose, UA: NEGATIVE mg/dL
Ketones, ur: NEGATIVE mg/dL
Leukocytes,Ua: NEGATIVE
Nitrite: NEGATIVE
Protein, ur: 30 mg/dL — AB
Specific Gravity, Urine: 1.008 (ref 1.005–1.030)
pH: 6 (ref 5.0–8.0)

## 2021-01-16 LAB — LACTIC ACID, PLASMA
Lactic Acid, Venous: 4.8 mmol/L (ref 0.5–1.9)
Lactic Acid, Venous: 2.7 mmol/L (ref 0.5–1.9)
Lactic Acid, Venous: 4 mmol/L (ref 0.5–1.9)

## 2021-01-16 LAB — COMPREHENSIVE METABOLIC PANEL
ALT: 38 U/L (ref 0–44)
AST: 79 U/L — ABNORMAL HIGH (ref 15–41)
Albumin: 3.5 g/dL (ref 3.5–5.0)
Alkaline Phosphatase: 195 U/L — ABNORMAL HIGH (ref 38–126)
Anion gap: 19 — ABNORMAL HIGH (ref 5–15)
BUN: 42 mg/dL — ABNORMAL HIGH (ref 6–20)
CO2: 16 mmol/L — ABNORMAL LOW (ref 22–32)
Calcium: 8.1 mg/dL — ABNORMAL LOW (ref 8.9–10.3)
Chloride: 98 mmol/L (ref 98–111)
Creatinine, Ser: 5.36 mg/dL — ABNORMAL HIGH (ref 0.44–1.00)
GFR, Estimated: 9 mL/min — ABNORMAL LOW (ref >60)
Glucose, Bld: 138 mg/dL — ABNORMAL HIGH (ref 70–99)
Potassium: 3.4 mmol/L — ABNORMAL LOW (ref 3.5–5.1)
Sodium: 133 mmol/L — ABNORMAL LOW (ref 135–145)
Total Bilirubin: 0.4 mg/dL (ref 0.3–1.2)
Total Protein: 6.9 g/dL (ref 6.5–8.1)

## 2021-01-16 LAB — MRSA NEXT GEN BY PCR, NASAL: MRSA by PCR Next Gen: NOT DETECTED

## 2021-01-16 LAB — ECHOCARDIOGRAM COMPLETE
AR max vel: 2.47 cm2
AV Area VTI: 2.42 cm2
AV Area mean vel: 2.2 cm2
AV Mean grad: 6 mmHg
AV Peak grad: 10.8 mmHg
Ao pk vel: 1.64 m/s
Area-P 1/2: 5.02 cm2
Height: 66 in
MV VTI: 1.73 cm2
S' Lateral: 2.4 cm
Weight: 2320 oz

## 2021-01-16 LAB — RENAL FUNCTION PANEL
Albumin: 3 g/dL — ABNORMAL LOW (ref 3.5–5.0)
Anion gap: 14 (ref 5–15)
BUN: 34 mg/dL — ABNORMAL HIGH (ref 6–20)
CO2: 16 mmol/L — ABNORMAL LOW (ref 22–32)
Calcium: 7.5 mg/dL — ABNORMAL LOW (ref 8.9–10.3)
Chloride: 104 mmol/L (ref 98–111)
Creatinine, Ser: 3.53 mg/dL — ABNORMAL HIGH (ref 0.44–1.00)
GFR, Estimated: 15 mL/min — ABNORMAL LOW (ref >60)
Glucose, Bld: 125 mg/dL — ABNORMAL HIGH (ref 70–99)
Phosphorus: 3.5 mg/dL (ref 2.5–4.6)
Potassium: 3.2 mmol/L — ABNORMAL LOW (ref 3.5–5.1)
Sodium: 134 mmol/L — ABNORMAL LOW (ref 135–145)

## 2021-01-16 LAB — CBC
HCT: 43.4 % (ref 36.0–46.0)
Hemoglobin: 13.9 g/dL (ref 12.0–15.0)
MCH: 30.7 pg (ref 26.0–34.0)
MCHC: 32 g/dL (ref 30.0–36.0)
MCV: 95.8 fL (ref 80.0–100.0)
Platelets: 319 10*3/uL (ref 150–400)
RBC: 4.53 MIL/uL (ref 3.87–5.11)
RDW: 14.2 % (ref 11.5–15.5)
WBC: 13.5 10*3/uL — ABNORMAL HIGH (ref 4.0–10.5)
nRBC: 0 % (ref 0.0–0.2)

## 2021-01-16 LAB — RAPID URINE DRUG SCREEN, HOSP PERFORMED
Amphetamines: NOT DETECTED
Barbiturates: NOT DETECTED
Benzodiazepines: NOT DETECTED
Cocaine: NOT DETECTED
Opiates: NOT DETECTED
Tetrahydrocannabinol: POSITIVE — AB

## 2021-01-16 LAB — PROCALCITONIN: Procalcitonin: 18.54 ng/mL

## 2021-01-16 LAB — PREGNANCY, URINE: Preg Test, Ur: NEGATIVE

## 2021-01-16 LAB — HIV ANTIBODY (ROUTINE TESTING W REFLEX): HIV Screen 4th Generation wRfx: NONREACTIVE

## 2021-01-16 LAB — ACETAMINOPHEN LEVEL: Acetaminophen (Tylenol), Serum: <10 ug/mL — ABNORMAL LOW (ref 10–30)

## 2021-01-16 LAB — SALICYLATE LEVEL: Salicylate Lvl: <7 mg/dL — ABNORMAL LOW (ref 7.0–30.0)

## 2021-01-16 LAB — RESP PANEL BY RT-PCR (FLU A&B, COVID) ARPGX2
Influenza A by PCR: NEGATIVE
Influenza B by PCR: NEGATIVE
SARS Coronavirus 2 by RT PCR: POSITIVE — AB

## 2021-01-16 LAB — ETHANOL: Alcohol, Ethyl (B): <10 mg/dL (ref <10)

## 2021-01-16 LAB — CK: Total CK: 2980 U/L — ABNORMAL HIGH (ref 38–234)

## 2021-01-16 MED ORDER — SODIUM CHLORIDE 0.9 % IV BOLUS
1000.0000 mL | Freq: Once | INTRAVENOUS | Status: AC
  Administered 2021-01-16: 1000 mL via INTRAVENOUS

## 2021-01-16 MED ORDER — LORAZEPAM 2 MG/ML IJ SOLN: 1.0000 mg | INTRAMUSCULAR | Status: AC | PRN

## 2021-01-16 MED ORDER — ALPRAZOLAM 1 MG PO TABS
1.0000 mg | ORAL_TABLET | Freq: Three times a day (TID) | ORAL | Status: DC | PRN
  Administered 2021-01-16 – 2021-01-23 (×14): 1 mg via ORAL
  Filled 2021-01-16: qty 2
  Filled 2021-01-16 (×3): qty 1
  Filled 2021-01-16: qty 2
  Filled 2021-01-16 (×4): qty 1
  Filled 2021-01-16: qty 2
  Filled 2021-01-16 (×5): qty 1

## 2021-01-16 MED ORDER — LORAZEPAM 2 MG/ML IJ SOLN
0.0000 mg | Freq: Two times a day (BID) | INTRAMUSCULAR | Status: AC
  Filled 2021-01-16: qty 1

## 2021-01-16 MED ORDER — THIAMINE HCL 100 MG/ML IJ SOLN
100.0000 mg | Freq: Every day | INTRAMUSCULAR | Status: DC
  Administered 2021-01-20: 100 mg via INTRAVENOUS
  Filled 2021-01-16: qty 2

## 2021-01-16 MED ORDER — GABAPENTIN 300 MG PO CAPS
300.0000 mg | ORAL_CAPSULE | Freq: Three times a day (TID) | ORAL | Status: DC
  Administered 2021-01-16 – 2021-01-24 (×23): 300 mg via ORAL
  Filled 2021-01-16 (×4): qty 1
  Filled 2021-01-16: qty 3
  Filled 2021-01-16 (×18): qty 1

## 2021-01-16 MED ORDER — ONDANSETRON HCL 4 MG/2ML IJ SOLN: 4.0000 mg | Freq: Four times a day (QID) | INTRAMUSCULAR | Status: DC | PRN

## 2021-01-16 MED ORDER — LORAZEPAM 1 MG PO TABS: 1.0000 mg | ORAL_TABLET | ORAL | Status: AC | PRN

## 2021-01-16 MED ORDER — POTASSIUM CHLORIDE CRYS ER 20 MEQ PO TBCR
40.0000 meq | EXTENDED_RELEASE_TABLET | Freq: Two times a day (BID) | ORAL | Status: AC
  Administered 2021-01-16 (×2): 40 meq via ORAL
  Filled 2021-01-16 (×2): qty 2

## 2021-01-16 MED ORDER — HYDROXYZINE HCL 25 MG PO TABS
25.0000 mg | ORAL_TABLET | Freq: Every day | ORAL | Status: DC
  Administered 2021-01-16 – 2021-01-19 (×4): 25 mg via ORAL
  Filled 2021-01-16 (×4): qty 1

## 2021-01-16 MED ORDER — LORAZEPAM 2 MG/ML IJ SOLN: 0.0000 mg | Freq: Four times a day (QID) | INTRAMUSCULAR | Status: AC

## 2021-01-16 MED ORDER — CHLORHEXIDINE GLUCONATE CLOTH 2 % EX PADS
6.0000 | MEDICATED_PAD | Freq: Every day | CUTANEOUS | Status: DC
  Administered 2021-01-16 – 2021-01-21 (×4): 6 via TOPICAL

## 2021-01-16 MED ORDER — PANTOPRAZOLE SODIUM 40 MG PO TBEC
40.0000 mg | DELAYED_RELEASE_TABLET | Freq: Every day | ORAL | Status: DC
  Administered 2021-01-16 – 2021-01-24 (×9): 40 mg via ORAL
  Filled 2021-01-16 (×9): qty 1

## 2021-01-16 MED ORDER — SODIUM BICARBONATE 650 MG PO TABS
650.0000 mg | ORAL_TABLET | Freq: Three times a day (TID) | ORAL | Status: DC
  Administered 2021-01-16 – 2021-01-17 (×6): 650 mg via ORAL
  Filled 2021-01-16 (×6): qty 1

## 2021-01-16 MED ORDER — SODIUM CHLORIDE 0.9 % IV SOLN
2.0000 g | Freq: Once | INTRAVENOUS | Status: AC
  Administered 2021-01-16: 2 g via INTRAVENOUS
  Filled 2021-01-16: qty 2

## 2021-01-16 MED ORDER — METRONIDAZOLE 500 MG/100ML IV SOLN
500.0000 mg | Freq: Two times a day (BID) | INTRAVENOUS | Status: DC
  Administered 2021-01-16 – 2021-01-17 (×4): 500 mg via INTRAVENOUS
  Filled 2021-01-16 (×4): qty 100

## 2021-01-16 MED ORDER — ADULT MULTIVITAMIN W/MINERALS CH
1.0000 | ORAL_TABLET | Freq: Every day | ORAL | Status: DC
  Administered 2021-01-16 – 2021-01-24 (×9): 1 via ORAL
  Filled 2021-01-16 (×10): qty 1

## 2021-01-16 MED ORDER — ONDANSETRON 4 MG PO TBDP: 8.0000 mg | ORAL_TABLET | Freq: Three times a day (TID) | ORAL | Status: DC | PRN

## 2021-01-16 MED ORDER — SODIUM CHLORIDE 0.9 % IV SOLN
1.0000 g | Freq: Three times a day (TID) | INTRAVENOUS | Status: DC
  Administered 2021-01-16 – 2021-01-17 (×2): 1 g via INTRAVENOUS
  Filled 2021-01-16 (×9): qty 1

## 2021-01-16 MED ORDER — PREGABALIN 75 MG PO CAPS: 100.0000 mg | ORAL_CAPSULE | Freq: Three times a day (TID) | ORAL | Status: DC

## 2021-01-16 MED ORDER — ACETAMINOPHEN 650 MG RE SUPP: 650.0000 mg | Freq: Four times a day (QID) | RECTAL | Status: DC | PRN

## 2021-01-16 MED ORDER — LACTATED RINGERS IV BOLUS (SEPSIS)
1000.0000 mL | Freq: Once | INTRAVENOUS | Status: AC
  Administered 2021-01-16: 1000 mL via INTRAVENOUS

## 2021-01-16 MED ORDER — ACETAMINOPHEN 325 MG PO TABS
650.0000 mg | ORAL_TABLET | Freq: Four times a day (QID) | ORAL | Status: DC | PRN
  Administered 2021-01-17: 650 mg via ORAL
  Filled 2021-01-16 (×2): qty 2

## 2021-01-16 MED ORDER — HEPARIN SODIUM (PORCINE) 5000 UNIT/ML IJ SOLN
5000.0000 [IU] | Freq: Three times a day (TID) | INTRAMUSCULAR | Status: DC
  Administered 2021-01-16 – 2021-01-23 (×21): 5000 [IU] via SUBCUTANEOUS
  Filled 2021-01-16 (×21): qty 1

## 2021-01-16 MED ORDER — LEVETIRACETAM 500 MG PO TABS
750.0000 mg | ORAL_TABLET | Freq: Two times a day (BID) | ORAL | Status: DC
  Administered 2021-01-16 – 2021-01-24 (×16): 750 mg via ORAL
  Filled 2021-01-16 (×17): qty 1

## 2021-01-16 MED ORDER — VANCOMYCIN HCL IN DEXTROSE 1-5 GM/200ML-% IV SOLN: 1000.0000 mg | Freq: Once | INTRAVENOUS | Status: DC

## 2021-01-16 MED ORDER — PREGABALIN 75 MG PO CAPS: 100.0000 mg | ORAL_CAPSULE | Freq: Every day | ORAL | Status: DC

## 2021-01-16 MED ORDER — LACTATED RINGERS IV SOLN: INTRAVENOUS | Status: DC

## 2021-01-16 MED ORDER — EZETIMIBE 10 MG PO TABS
10.0000 mg | ORAL_TABLET | Freq: Every day | ORAL | Status: DC
  Administered 2021-01-16 – 2021-01-24 (×9): 10 mg via ORAL
  Filled 2021-01-16 (×9): qty 1

## 2021-01-16 MED ORDER — LACTATED RINGERS IV BOLUS
1000.0000 mL | Freq: Once | INTRAVENOUS | Status: AC
  Administered 2021-01-16: 1000 mL via INTRAVENOUS

## 2021-01-16 MED ORDER — NICOTINE 14 MG/24HR TD PT24
14.0000 mg | MEDICATED_PATCH | Freq: Every day | TRANSDERMAL | Status: DC
  Filled 2021-01-16 (×7): qty 1

## 2021-01-16 MED ORDER — VANCOMYCIN HCL 1500 MG/300ML IV SOLN
1500.0000 mg | Freq: Once | INTRAVENOUS | Status: AC
  Administered 2021-01-16: 1500 mg via INTRAVENOUS
  Filled 2021-01-16: qty 300

## 2021-01-16 MED ORDER — AMITRIPTYLINE HCL 25 MG PO TABS
75.0000 mg | ORAL_TABLET | Freq: Every day | ORAL | Status: DC
  Administered 2021-01-16 – 2021-01-19 (×4): 75 mg via ORAL
  Filled 2021-01-16 (×4): qty 3

## 2021-01-16 MED ORDER — FOLIC ACID 1 MG PO TABS
1.0000 mg | ORAL_TABLET | Freq: Every day | ORAL | Status: DC
  Administered 2021-01-16 – 2021-01-24 (×9): 1 mg via ORAL
  Filled 2021-01-16 (×9): qty 1

## 2021-01-16 MED ORDER — DULOXETINE HCL 30 MG PO CPEP
30.0000 mg | ORAL_CAPSULE | Freq: Every day | ORAL | Status: DC
  Administered 2021-01-16 – 2021-01-24 (×9): 30 mg via ORAL
  Filled 2021-01-16 (×9): qty 1

## 2021-01-16 MED ORDER — NALOXONE HCL 0.4 MG/ML IJ SOLN
INTRAMUSCULAR | Status: AC
  Administered 2021-01-16: 0.4 mg
  Filled 2021-01-16: qty 1

## 2021-01-16 MED ORDER — ONDANSETRON HCL 4 MG PO TABS: 4.0000 mg | ORAL_TABLET | Freq: Four times a day (QID) | ORAL | Status: DC | PRN

## 2021-01-16 MED ORDER — VANCOMYCIN HCL IN DEXTROSE 1-5 GM/200ML-% IV SOLN: 1000.0000 mg | INTRAVENOUS | Status: DC

## 2021-01-16 MED ORDER — ALBUTEROL SULFATE HFA 108 (90 BASE) MCG/ACT IN AERS: 2.0000 | INHALATION_SPRAY | RESPIRATORY_TRACT | Status: DC | PRN

## 2021-01-16 MED ORDER — THIAMINE HCL 100 MG PO TABS
100.0000 mg | ORAL_TABLET | Freq: Every day | ORAL | Status: DC
  Administered 2021-01-16 – 2021-01-24 (×8): 100 mg via ORAL
  Filled 2021-01-16 (×9): qty 1

## 2021-01-16 NOTE — Progress Notes (Signed)
ASSUMPTION OF CARE NOTE   01/16/2021 6:21 PM  [name] was seen and examined.  The H&P by the admitting provider, orders, imaging was reviewed.  Please see new orders.  Will continue to follow.  Pt has elevated lactic acid and she has been started on broad spectrum antibiotics pending sepsis work up.  Continue aggressive hydration, nephology consultation requested for rhabdomyolysis with AKI.  Updated family at bedside.   Vitals:   01/16/21 1600 01/16/21 1649  BP: (!) 122/103 113/88  Pulse: (!) 116 (!) 116  Resp: (!) 24 (!) 27  Temp:    SpO2: 98% 98%    Results for orders placed or performed during the hospital encounter of 01/16/21  Resp Panel by RT-PCR (Flu A&B, Covid) Nasopharyngeal Swab   Specimen: Nasopharyngeal Swab; Nasopharyngeal(NP) swabs in vial transport medium  Result Value Ref Range   SARS Coronavirus 2 by RT PCR POSITIVE (A) NEGATIVE   Influenza A by PCR NEGATIVE NEGATIVE   Influenza B by PCR NEGATIVE NEGATIVE  MRSA Next Gen by PCR, Nasal   Specimen: Nasal Mucosa; Nasal Swab  Result Value Ref Range   MRSA by PCR Next Gen NOT DETECTED NOT DETECTED  Culture, blood (routine x 2)   Specimen: BLOOD RIGHT ARM  Result Value Ref Range   Specimen Description      BLOOD RIGHT ARM BOTTLES DRAWN AEROBIC AND ANAEROBIC   Special Requests      Blood Culture adequate volume Performed at [facility], [address], [place], [place] [address]    Culture PENDING    Report Status PENDING   CBC  Result Value Ref Range   WBC 13.5 (H) 4.0 - 10.5 K/uL   RBC 4.53 3.87 - 5.11 MIL/uL   Hemoglobin 13.9 12.0 - 15.0 g/dL   HCT 43.4 36.0 - 46.0 %   MCV 95.8 80.0 - 100.0 fL   MCH 30.7 26.0 - 34.0 pg   MCHC 32.0 30.0 - 36.0 g/dL   RDW 14.2 11.5 - 15.5 %   Platelets 319 150 - 400 K/uL   nRBC 0.0 0.0 - 0.2 %  Comprehensive metabolic panel  Result Value Ref Range   Sodium 133 (L) 135 - 145 mmol/L   Potassium 3.4 (L) 3.5 - 5.1 mmol/L   Chloride 98 98 - 111 mmol/L   CO2 16 (L) 22  - 32 mmol/L   Glucose, Bld 138 (H) 70 - 99 mg/dL   BUN 42 (H) 6 - 20 mg/dL   Creatinine, Ser 5.36 (H) 0.44 - 1.00 mg/dL   Calcium 8.1 (L) 8.9 - 10.3 mg/dL   Total Protein 6.9 6.5 - 8.1 g/dL   Albumin 3.5 3.5 - 5.0 g/dL   AST 79 (H) 15 - 41 U/L   ALT 38 0 - 44 U/L   Alkaline Phosphatase 195 (H) 38 - 126 U/L   Total Bilirubin 0.4 0.3 - 1.2 mg/dL   GFR, Estimated 9 (L) >60 mL/min   Anion gap 19 (H) 5 - 15  Lactic acid, plasma  Result Value Ref Range   Lactic Acid, Venous 4.8 (HH) 0.5 - 1.9 mmol/L  Ethanol  Result Value Ref Range   Alcohol, Ethyl (B) <10 <10 mg/dL  Acetaminophen level  Result Value Ref Range   Acetaminophen (Tylenol), Serum <10 (L) 10 - 30 ug/mL  Salicylate level  Result Value Ref Range   Salicylate Lvl <4.0 (L) 7.0 - 30.0 mg/dL  CK  Result Value Ref Range   Total CK 2,980 (H)  38 - 234 U/L  Urinalysis, Routine w reflex microscopic Urine, In & Out Cath  Result Value Ref Range   Color, Urine YELLOW YELLOW   APPearance HAZY (A) CLEAR   Specific Gravity, Urine 1.008 1.005 - 1.030   pH 6.0 5.0 - 8.0   Glucose, UA NEGATIVE NEGATIVE mg/dL   Hgb urine dipstick LARGE (A) NEGATIVE   Bilirubin Urine NEGATIVE NEGATIVE   Ketones, ur NEGATIVE NEGATIVE mg/dL   Protein, ur 30 (A) NEGATIVE mg/dL   Nitrite NEGATIVE NEGATIVE   Leukocytes,Ua NEGATIVE NEGATIVE   RBC / HPF 0-5 0 - 5 RBC/hpf   WBC, UA 0-5 0 - 5 WBC/hpf   Bacteria, UA RARE (A) NONE SEEN   Squamous Epithelial / LPF 0-5 0 - 5   Mucus PRESENT    Hyaline Casts, UA PRESENT   Pregnancy, urine  Result Value Ref Range   Preg Test, Ur NEGATIVE NEGATIVE  Lactic acid, plasma  Result Value Ref Range   Lactic Acid, Venous 2.7 (HH) 0.5 - 1.9 mmol/L  Rapid urine drug screen (hospital performed)  Result Value Ref Range   Opiates NONE DETECTED NONE DETECTED   Cocaine NONE DETECTED NONE DETECTED   Benzodiazepines NONE DETECTED NONE DETECTED   Amphetamines NONE DETECTED NONE DETECTED   Tetrahydrocannabinol POSITIVE (A)  NONE DETECTED   Barbiturates NONE DETECTED NONE DETECTED  HIV Antibody (routine testing w rflx)  Result Value Ref Range   HIV Screen 4th Generation wRfx Non Reactive Non Reactive  Lactic acid, plasma  Result Value Ref Range   Lactic Acid, Venous 4.0 (HH) 0.5 - 1.9 mmol/L  Blood gas, venous  Result Value Ref Range   FIO2 21.00    pH, Ven 7.234 (L) 7.250 - 7.430   pCO2, Ven 40.4 (L) 44.0 - 60.0 mmHg   pO2, Ven <31.0 (LL) 32.0 - 45.0 mmHg   Bicarbonate 15.3 (L) 20.0 - 28.0 mmol/L   Acid-base deficit 9.6 (H) 0.0 - 2.0 mmol/L   O2 Saturation 37.8 %   Patient temperature 36.5   Procalcitonin - Baseline  Result Value Ref Range   Procalcitonin 18.54 ng/mL  Renal function panel  Result Value Ref Range   Sodium 134 (L) 135 - 145 mmol/L   Potassium 3.2 (L) 3.5 - 5.1 mmol/L   Chloride 104 98 - 111 mmol/L   CO2 16 (L) 22 - 32 mmol/L   Glucose, Bld 125 (H) 70 - 99 mg/dL   BUN 34 (H) 6 - 20 mg/dL   Creatinine, Ser 3.53 (H) 0.44 - 1.00 mg/dL   Calcium 7.5 (L) 8.9 - 10.3 mg/dL   Phosphorus 3.5 2.5 - 4.6 mg/dL   Albumin 3.0 (L) 3.5 - 5.0 g/dL   GFR, Estimated 15 (L) >60 mL/min   Anion gap 14 5 - 15  ECHOCARDIOGRAM COMPLETE  Result Value Ref Range   Weight 2,320 oz   Height 66 in   BP 81/20 mmHg   AR max vel 2.47 cm2   AV Area VTI 2.42 cm2   AV Mean grad 6.0 mmHg   AV Peak grad 10.8 mmHg   Ao pk vel 1.64 m/s   AV Area mean vel 2.20 cm2   MV VTI 1.73 cm2   Area-P 1/2 5.02 cm2   S' Lateral 2.40 cm   Time spent: 40 mins  Murvin Natal, MD [facility]   01/16/2021 12:04 AM How to contact the [facility] Attending or Consulting provider 7A - 7P or covering provider during after hours 7P -7A,  for this patient?  Check the care team in [facility] and look for a) attending/consulting [facility] provider listed and b) the [facility] team listed Log into [email or web address] and use [place]'s universal password to access. If you do not have the password, please contact the hospital operator. Locate the [facility] provider  you are looking for under [facility] and page to a number that you can be directly reached. If you still have difficulty reaching the provider, please page the [facility] (Director on Call) for the Hospitalists listed on amion for assistance.

## 2021-01-16 NOTE — H&P (Addendum)
TRH H&P    Patient Demographics:    [name], is a 50 y.o. female  MRN: [id number]  DOB - 1970/05/18  Admit Date - 01/16/2021  Referring MD/NP/PA: Sedonia Small  Outpatient Primary MD for the patient is Teodoro Kil, PA-C  Patient coming from: Home  Chief complaint- seizure    HPI:    [name]  is a 50 y.o. female, with history of COPD, Corhn's, GERD, HTN, T2DM, and more presents to the ED with a chief complaint of seizure.  It is unclear how accurate patient's history is.  She reports that she was sitting on the toilet, attempting to void but could not start a stream, when she had a seizure.  When asked how she knew it was a seizure she reports it is because she has had seizures before.  Patient reports that she is supposed to be on seizure medication but she does not know the moderate the dose.  She reports that she remembers the whole thing.  There was no rhythmic jerking or twitching.  She reports she just knows it was a seizure.  Patient reports that she did not lose consciousness and remembers the whole thing.  Somehow, a "stranger" found her and brought her to the hospital per her report.  I discussed with the patient that her labs would indicate that she was laying on the ground for some time, and asked if she had lost time.  Patient reports that she hit the ground several times yesterday because she found out that her mother and father died at the same time in 2 different locations.  Again the accuracy of this history is unclear.    Patient reports that she does smoke, does not drink, does not use illicit drugs.  She is not vaccinated for COVID.  She is full code.  In the ED Temp 98.5, heart rate 94-1 09, respiratory 14-27, blood pressure 94/51 at admission but had been as low as 65/40 Leukocytosis 13.5, hemoglobin 13.9 Chemistry panel reveals an AKI with a BUN of 42 and a creatinine of 5.36-baseline  is around 1 Patient had undetectable Tylenol level, undetectable aspirin level, undetectable alcohol level After admission COVID came back positive Alk phos slightly elevated at 195, AST minimally elevated at 79, ALT 38 CPK 2980 CT head shows no acute intracranial abnormality Chest x-ray shows no active disease UA is not indicative of an infection UDS positive for THC, patient has consistently been positive for benzos in the past and has a prescription for Xanax 3 times daily no benzos in this urine Patient was given 3 L bolus, and continued on LR 150 ED provider requests admission for observation in the setting of dehydration AKI     Review of systems:    In addition to the HPI above,  No Fever-chills, No Headache, No changes with Vision or hearing, No problems swallowing food or Liquids, No Chest pain, Cough or Shortness of Breath, No Abdominal pain, nausea but no vomiting,, bowel movements are regular, No Blood in stool or Urine, difficulty with voiding  No dysuria, No new skin rashes or bruises, No new joints pains-aches,  No new weakness, tingling, numbness in any extremity, No recent weight gain or loss, No polyuria, polydypsia or polyphagia, No significant Mental Stressors.  All other systems reviewed and are negative.    Past History of the following :    Past Medical History:  Diagnosis Date   Anxiety    Asthma    Chronic low back pain    Collagen vascular disease (HCC)    COPD (chronic obstructive pulmonary disease) (HCC)    Crohn's disease (HCC)    GERD (gastroesophageal reflux disease)    EGD 01/2007 by Dr.Rourke small hiatal hernia s/p 56 french maloney    History of head injury    Hypertension    IBS (irritable bowel syndrome)    PSVT (paroxysmal supraventricular tachycardia) (HCC)    PTSD (post-traumatic stress disorder)    Recurrent chest pain    Seizure disorder ([place])    Type 2 diabetes mellitus ([place])       Past Surgical History:  Procedure  Laterality Date   ABDOMINAL HYSTERECTOMY     with right salpingo oophorectomy 2005   APPENDECTOMY  2006   CESAREAN SECTION     1996   CHOLECYSTECTOMY     COLONOSCOPY  2010   Dr. Gala Romney; negative except for hemorrhoids   ESOPHAGEAL DILATION N/A 10/25/2014   Procedure: ESOPHAGEAL DILATION;  Surgeon: Rogene Houston, MD;  [facility];  Service: Endoscopy;  Laterality: N/A;   ESOPHAGOGASTRODUODENOSCOPY N/A 10/25/2014   Procedure: ESOPHAGOGASTRODUODENOSCOPY (EGD);  Surgeon: Rogene Houston, MD;  Location: [facility];  Service: Endoscopy;  Laterality: N/A;  1250   OOPHORECTOMY     left for torsion and ovarian fibroma; uterine myoma resected 1995      Social History:      Social History   Tobacco Use   Smoking status: Some Days    Packs/day: 0.50    Years: 20.00    Pack years: 10.00    Types: Cigarettes    Start date: 09/26/1984   Smokeless tobacco: Never  Substance Use Topics   Alcohol use: No    Alcohol/week: 0.0 standard drinks       Family History :     Family History  Problem Relation Age of Onset   Cancer Mother    Heart failure Mother    Hypertension Mother    Heart attack Mother    Heart attack Father 32   Cancer Father    Heart failure Father    Diabetes Father       Home Medications:   Prior to Admission medications   Medication Sig Start Date End Date Taking? Authorizing Provider  ALPRAZolam [name]) 1 MG tablet Take 1 mg by mouth 3 (three) times daily as needed. 07/11/19   [provider]  amitriptyline (ELAVIL) 75 MG tablet Take 75 mg by mouth at bedtime. 12/16/20   [provider]  cetirizine (ZYRTEC) 10 MG tablet Take 10 mg by mouth daily. 03/30/19   [provider]  cyclobenzaprine (FLEXERIL) 10 MG tablet Take 10 mg by mouth 3 (three) times daily as needed. 07/11/19   [provider]  diphenoxylate-atropine (LOMOTIL) 2.5-0.025 MG tablet Take 1 tablet 4 (four) times daily as needed by mouth for diarrhea  or loose stools. 03/17/17   Evalee Jefferson, PA-C  hydrOXYzine (ATARAX/VISTARIL) 25 MG tablet Take 1 tablet by mouth at bedtime. 07/11/19   [provider]  nitroGLYCERIN (NITROSTAT)  0.4 MG SL tablet Place 1 tablet (0.4 mg total) under the tongue every 5 (five) minutes as needed for chest pain. 04/15/15   Herminio Commons, MD  omeprazole (PRILOSEC) 40 MG capsule Take 40 mg by mouth daily as needed.  07/21/19   [provider]  ondansetron (ZOFRAN ODT) 8 MG disintegrating tablet Take 1 tablet (8 mg total) every 8 (eight) hours as needed by mouth for nausea or vomiting. 03/17/17   Evalee Jefferson, PA-C  pregabalin (LYRICA) 100 MG capsule Take 100 mg by mouth 3 (three) times daily. 12/16/20   [provider]  promethazine (PHENERGAN) 25 MG tablet Take 1 tablet (25 mg total) by mouth every 6 (six) hours as needed for nausea or vomiting. 07/30/19   Noemi Chapel, MD  SPIRIVA RESPIMAT 2.5 MCG/ACT AERS SMARTSIG:2 Puff(s) Via Inhaler Daily 12/12/20   [provider]     Allergies:     Allergies  Allergen Reactions   Codeine Shortness Of Breath, Swelling and Rash    Throat swelling   Dilaudid [Hydromorphone Hcl] Shortness Of Breath and Swelling   Hydrocodone Shortness Of Breath, Swelling and Rash   Morphine And Related Shortness Of Breath, Swelling and Rash   Oxycodone Shortness Of Breath, Swelling and Rash    Patient states ALL pain medications make her deathly sick.   Penicillins Shortness Of Breath, Swelling and Other (See Comments)    Has patient had a PCN reaction causing immediate rash, facial/tongue/throat swelling, SOB or lightheadedness with hypotension: Yes Has patient had a PCN reaction causing severe rash involving mucus membranes or skin necrosis: Yes Has patient had a PCN reaction that required hospitalization Yes Has patient had a PCN reaction occurring within the last 10 years: No If all of the above answers are "NO", then may proceed with Cephalosporin  use.   Throat swells    Acetaminophen     Per MD patient states she can't take Tylenol because of liver enzymes   Ativan [Lorazepam] Other (See Comments)    Migraines.   Strawberry Extract Swelling   Watermelon Concentrate Swelling   Benadryl [Diphenhydramine Hcl] Palpitations   Latex Rash     Physical Exam:   Vitals  Blood pressure (!) 84/54, pulse (!) 101, temperature 98.5 F (36.9 C), temperature source Oral, resp. rate (!) 21, height 5' 6" (1.676 m), weight 65.8 kg, SpO2 98 %.  1.  General: Patient lying supine in bed,  no acute distress   2. Psychiatric: Alert and oriented x 3, mood and behavior normal for situation,  and cooperative with exam   3. Neurologic: Speech and language are normal, face is symmetric, moves all 4 extremities voluntarily, equal sensation in the extremities BL, at baseline without acute deficits on limited exam   4. HEENMT:  Head is atraumatic, normocephalic, pupils reactive to light, neck is supple, trachea is midline, mucous membranes are dry   5. Respiratory : Lungs are clear to auscultation bilaterally without wheezing, rhonchi, rales, no cyanosis, no increase in work of breathing or accessory muscle use   6. Cardiovascular : Heart rate is tachycardic, rhythm is regular, no murmurs, rubs or gallops, no peripheral edema, peripheral pulses palpated   7. Gastrointestinal:  Abdomen is soft, nondistended, tender over the suprapubic region, bowel sounds active, no masses or organomegaly palpated   8. Skin:  Skin is warm, dry and intact without rashes, acute lesions, or ulcers on limited exam   9.Musculoskeletal:  No acute deformities or trauma, no asymmetry in tone,  no peripheral edema, peripheral pulses palpated, no tenderness to palpation in the extremities     Data Review:    CBC Recent Labs  Lab 01/16/21 0041  WBC 13.5*  HGB 13.9  HCT 43.4  PLT 319  MCV 95.8  MCH 30.7  MCHC 32.0  RDW 14.2    ------------------------------------------------------------------------------------------------------------------  Results for orders placed or performed during the hospital encounter of 01/16/21 (from the past 48 hour(s))  CBC     Status: Abnormal   Collection Time: 01/16/21 12:41 AM  Result Value Ref Range   WBC 13.5 (H) 4.0 - 10.5 K/uL   RBC 4.53 3.87 - 5.11 MIL/uL   Hemoglobin 13.9 12.0 - 15.0 g/dL   HCT 43.4 36.0 - 46.0 %   MCV 95.8 80.0 - 100.0 fL   MCH 30.7 26.0 - 34.0 pg   MCHC 32.0 30.0 - 36.0 g/dL   RDW 14.2 11.5 - 15.5 %   Platelets 319 150 - 400 K/uL   nRBC 0.0 0.0 - 0.2 %    Comment: Performed at [facility], [address], [place], [place] [address]  Comprehensive metabolic panel     Status: Abnormal   Collection Time: 01/16/21 12:41 AM  Result Value Ref Range   Sodium 133 (L) 135 - 145 mmol/L   Potassium 3.4 (L) 3.5 - 5.1 mmol/L   Chloride 98 98 - 111 mmol/L   CO2 16 (L) 22 - 32 mmol/L   Glucose, Bld 138 (H) 70 - 99 mg/dL    Comment: Glucose reference range applies only to samples taken after fasting for at least 8 hours.   BUN 42 (H) 6 - 20 mg/dL   Creatinine, Ser 5.36 (H) 0.44 - 1.00 mg/dL   Calcium 8.1 (L) 8.9 - 10.3 mg/dL   Total Protein 6.9 6.5 - 8.1 g/dL   Albumin 3.5 3.5 - 5.0 g/dL   AST 79 (H) 15 - 41 U/L   ALT 38 0 - 44 U/L   Alkaline Phosphatase 195 (H) 38 - 126 U/L   Total Bilirubin 0.4 0.3 - 1.2 mg/dL   GFR, Estimated 9 (L) >60 mL/min    Comment: (NOTE) Calculated using the CKD-EPI Creatinine Equation (2021)    Anion gap 19 (H) 5 - 15    Comment: Performed at [facility], [address] [place], [place] [address]  Lactic acid, plasma     Status: Abnormal   Collection Time: 01/16/21 12:41 AM  Result Value Ref Range   Lactic Acid, Venous 4.8 (HH) 0.5 - 1.9 mmol/L    Comment: Brame,M_0  by Matthews, B 9.16.22 Performed at [facility], [address] [place], [place] [address]   Ethanol     Status: None   Collection Time: 01/16/21  12:41 AM  Result Value Ref Range   Alcohol, Ethyl (B) <10 <10 mg/dL    Comment: (NOTE) Lowest detectable limit for serum alcohol is 10 mg/dL.  For medical purposes only. Performed at [facility], [address] [place], [place] [address]   Acetaminophen level     Status: Abnormal   Collection Time: 01/16/21 12:41 AM  Result Value Ref Range   Acetaminophen (Tylenol), Serum <10 (L) 10 - 30 ug/mL    Comment: (NOTE) Therapeutic concentrations vary significantly. A range of 10-30 ug/mL  may be an effective concentration for many patients. However, some  are best treated at concentrations outside of this range. Acetaminophen concentrations >150 ug/mL at 4 hours after ingestion  and >50 ug/mL at 12 hours  after ingestion are often associated with  toxic reactions.  Performed at [facility], [address], [place], [place] [address]   Salicylate level     Status: Abnormal   Collection Time: 01/16/21 12:41 AM  Result Value Ref Range   Salicylate Lvl <3.3 (L) 7.0 - 30.0 mg/dL    Comment: Performed at [facility], [address] [place], [place] [address]  CK     Status: Abnormal   Collection Time: 01/16/21 12:41 AM  Result Value Ref Range   Total CK 2,980 (H) 38 - 234 U/L    Comment: Performed at [facility], [address], [place], [place] [address]  Resp Panel by RT-PCR (Flu A&B, Covid) Nasopharyngeal Swab     Status: Abnormal   Collection Time: 01/16/21  2:32 AM   Specimen: Nasopharyngeal Swab; Nasopharyngeal(NP) swabs in vial transport medium  Result Value Ref Range   SARS Coronavirus 2 by RT PCR POSITIVE (A) NEGATIVE    Comment: RESULT CALLED TO, READ BACK BY AND VERIFIED WITH: HEARN,J_0  by Matthews,b 9.16.22 (NOTE) SARS-CoV-2 target nucleic acids are DETECTED.  The SARS-CoV-2 RNA is generally detectable in upper respiratory specimens during the acute phase of infection. Positive results are indicative of the presence of the identified virus, but do not rule out  bacterial infection or co-infection with other pathogens not detected by the test. Clinical correlation with patient history and other diagnostic information is necessary to determine patient infection status. The expected result is Negative.  Fact Sheet for Patients: EntrepreneurPulse.com.au  Fact Sheet for Healthcare Providers: IncredibleEmployment.be  This test is not yet approved or cleared by the [place] FDA and  has been authorized for detection and/or diagnosis of SARS-CoV-2 by FDA under an Emergency Use Authorization (EUA).  This EUA will remain in effect (meaning this test can b e used) for the duration of  the COVID-19 declaration under Section 564(b)(1) of the Act, 21 U.S.C. section 360bbb-3(b)(1), unless the authorization is terminated or revoked sooner.     Influenza A by PCR NEGATIVE NEGATIVE   Influenza B by PCR NEGATIVE NEGATIVE    Comment: (NOTE) The Xpert Xpress SARS-CoV-2/FLU/RSV plus assay is intended as an aid in the diagnosis of influenza from Nasopharyngeal swab specimens and should not be used as a sole basis for treatment. Nasal washings and aspirates are unacceptable for Xpert Xpress SARS-CoV-2/FLU/RSV testing.  Fact Sheet for Patients: EntrepreneurPulse.com.au  Fact Sheet for Healthcare Providers: IncredibleEmployment.be  This test is not yet approved or cleared by the [place] FDA and has been authorized for detection and/or diagnosis of SARS-CoV-2 by FDA under an Emergency Use Authorization (EUA). This EUA will remain in effect (meaning this test can be used) for the duration of the COVID-19 declaration under Section 564(b)(1) of the Act, 21 U.S.C. section 360bbb-3(b)(1), unless the authorization is terminated or revoked.  Performed at [facility], [address] [place], [place] [address]   Urinalysis, Routine w reflex microscopic Urine, In & Out Cath      Status: Abnormal   Collection Time: 01/16/21  2:56 AM  Result Value Ref Range   Color, Urine YELLOW YELLOW   APPearance HAZY (A) CLEAR   Specific Gravity, Urine 1.008 1.005 - 1.030   pH 6.0 5.0 - 8.0   Glucose, UA NEGATIVE NEGATIVE mg/dL   Hgb urine dipstick LARGE (A) NEGATIVE   Bilirubin Urine NEGATIVE NEGATIVE   Ketones, ur NEGATIVE NEGATIVE mg/dL   Protein, ur 30 (A) NEGATIVE mg/dL   Nitrite NEGATIVE NEGATIVE  Leukocytes,Ua NEGATIVE NEGATIVE   RBC / HPF 0-5 0 - 5 RBC/hpf   WBC, UA 0-5 0 - 5 WBC/hpf   Bacteria, UA RARE (A) NONE SEEN   Squamous Epithelial / LPF 0-5 0 - 5   Mucus PRESENT    Hyaline Casts, UA PRESENT     Comment: Performed at [facility], [address], [place], [place] [address]  Pregnancy, urine     Status: None   Collection Time: 01/16/21  2:56 AM  Result Value Ref Range   Preg Test, Ur NEGATIVE NEGATIVE    Comment:        THE SENSITIVITY OF THIS METHODOLOGY IS >20 mIU/mL. Performed at [facility], [address], [place], [place] [address]   Rapid urine drug screen (hospital performed)     Status: Abnormal   Collection Time: 01/16/21  2:59 AM  Result Value Ref Range   Opiates NONE DETECTED NONE DETECTED   Cocaine NONE DETECTED NONE DETECTED   Benzodiazepines NONE DETECTED NONE DETECTED   Amphetamines NONE DETECTED NONE DETECTED   Tetrahydrocannabinol POSITIVE (A) NONE DETECTED   Barbiturates NONE DETECTED NONE DETECTED    Comment: (NOTE) DRUG SCREEN FOR MEDICAL PURPOSES ONLY.  IF CONFIRMATION IS NEEDED FOR ANY PURPOSE, NOTIFY LAB WITHIN 5 DAYS.  LOWEST DETECTABLE LIMITS FOR URINE DRUG SCREEN Drug Class                     Cutoff (ng/mL) Amphetamine and metabolites    1000 Barbiturate and metabolites    200 Benzodiazepine                 818 Tricyclics and metabolites     300 Opiates and metabolites        300 Cocaine and metabolites        300 THC                            50 Performed at [facility], [address], [place], [place]  [address]   Lactic acid, plasma     Status: Abnormal   Collection Time: 01/16/21  3:20 AM  Result Value Ref Range   Lactic Acid, Venous 2.7 (HH) 0.5 - 1.9 mmol/L    Comment: CRITICAL VALUE NOTED.  VALUE IS CONSISTENT WITH PREVIOUSLY REPORTED AND CALLED VALUE. Performed at [facility], [address] [place], [place] [address]     Chemistries  Recent Labs  Lab 01/16/21 0041  NA 133*  K 3.4*  CL 98  CO2 16*  GLUCOSE 138*  BUN 42*  CREATININE 5.36*  CALCIUM 8.1*  AST 79*  ALT 38  ALKPHOS 195*  BILITOT 0.4   ------------------------------------------------------------------------------------------------------------------  ------------------------------------------------------------------------------------------------------------------ GFR: Estimated Creatinine Clearance: 11.6 mL/min (A) (by C-G formula based on SCr of 5.36 mg/dL (H)). Liver Function Tests: Recent Labs  Lab 01/16/21 0041  AST 79*  ALT 38  ALKPHOS 195*  BILITOT 0.4  PROT 6.9  ALBUMIN 3.5   No results for input(s): LIPASE, AMYLASE in the last 168 hours. No results for input(s): AMMONIA in the last 168 hours. Coagulation Profile: No results for input(s): INR, PROTIME in the last 168 hours. Cardiac Enzymes: Recent Labs  Lab 01/16/21 0041  CKTOTAL 2,980*   BNP (last 3 results) No results for input(s): PROBNP in the last 8760 hours. HbA1C: No results for input(s): HGBA1C in the last 72 hours. CBG: No results for input(s): GLUCAP in the last 168 hours. Lipid  Profile: No results for input(s): CHOL, HDL, LDLCALC, TRIG, CHOLHDL, LDLDIRECT in the last 72 hours. Thyroid Function Tests: No results for input(s): TSH, T4TOTAL, FREET4, T3FREE, THYROIDAB in the last 72 hours. Anemia Panel: No results for input(s): VITAMINB12, FOLATE, FERRITIN, TIBC, IRON, RETICCTPCT in the last 72 hours.  --------------------------------------------------------------------------------------------------------------- Urine  analysis:    Component Value Date/Time   COLORURINE YELLOW 01/16/2021 0256   APPEARANCEUR HAZY (A) 01/16/2021 0256   LABSPEC 1.008 01/16/2021 0256   PHURINE 6.0 01/16/2021 0256   GLUCOSEU NEGATIVE 01/16/2021 0256   GLUCOSEU NEG mg/dL 12/24/2009 2317   HGBUR LARGE (A) 01/16/2021 0256   BILIRUBINUR NEGATIVE 01/16/2021 0256   KETONESUR NEGATIVE 01/16/2021 0256   PROTEINUR 30 (A) 01/16/2021 0256   UROBILINOGEN 0.2 12/26/2013 1530   NITRITE NEGATIVE 01/16/2021 0256   LEUKOCYTESUR NEGATIVE 01/16/2021 0256      Imaging Results:    CT HEAD WO CONTRAST (5MM)  Result Date: 01/16/2021 CLINICAL DATA:  Status post seizure. EXAM: CT HEAD WITHOUT CONTRAST TECHNIQUE: Contiguous axial images were obtained from the base of the skull through the vertex without intravenous contrast. COMPARISON:  June 03, 2020 FINDINGS: Brain: No evidence of acute infarction, hemorrhage, hydrocephalus, extra-axial collection or mass lesion/mass effect. Vascular: No hyperdense vessel or unexpected calcification. Skull: Normal. Negative for fracture or focal lesion. Sinuses/Orbits: A small right maxillary sinus air-fluid level is seen. Other: None. IMPRESSION: 1. No acute intracranial abnormality. Electronically Signed   By: Virgina Norfolk M.D.   On: 01/16/2021 01:08   DG Chest Port 1 View  Result Date: 01/16/2021 CLINICAL DATA:  Seizure. EXAM: PORTABLE CHEST 1 VIEW COMPARISON:  May 09, 2016 FINDINGS: The heart size and mediastinal contours are within normal limits. Both lungs are clear. The visualized skeletal structures are unremarkable. IMPRESSION: No active disease. Electronically Signed   By: Virgina Norfolk M.D.   On: 01/16/2021 01:02    My personal review of EKG: Rhythm sinus tachycardia with a rate of 108/min, QTc 546 ,no Acute ST changes   Assessment & Plan:    Principal Problem:   AKI (acute kidney injury) ([place]) Active Problems:   GERD (gastroesophageal reflux disease)   Diabetes mellitus without  complication (HCC)   Tobacco abuse   Rhabdomyolysis   Lactic acidosis   COVID-19 virus infection   Acute metabolic encephalopathy Patient is alert and gives answers that could be appropriate for the questions asked, further history is not consistent with her presentation.  Baseline is unknown but I believe she is still altered Differential includes withdrawal from benzo, infection (covid positive), dehydration, CT head did not show acute intracranial abnormality What patient is describing does not sound like a seizure, but her history is unreliable, EEG, if any thing changes on neuro exam or if patient does have any seizure-like activity consider consulting inpatient neuro.  She reports being aware of the entire event, and if this altered mentation was post ictal mood expected to improve the longer that she is in the ER. Continue treatments as below CIWA protocol for possible withdrawal, and restart Xanax AKI Creatinine increased from 1-5.36 Patient is severely dehydrated at presentation 3 L of fluid in the ED, continue aggressive hydration Avoid nephrotoxic agents when possible Continue to monitor 3.     Sepsis secondary to Covid SIRS criteria tachycardic to 109, tachypnea-RR as high as 27, white blood cell count 13.5, AKI, lactic acidosis, hypotension Most likely source of infection is COVID Life threatening organ dysfunction 2/2 infection is evidenced by: Hypotension, lactic acidosis,  creatinine increased from 1-5.36 No bacterial source has been identified on the work-up so far, no antibiotics indicated.  Patient is not hypoxic so remdesivir and Decadron are not indicated. Fluids given prior to admission 3 L SOFA Score 5; SOFA score >2 indicates higher risk or mortality - patient is at high risk for decompensation Sepsis order set utilized, procal pending Albuterol as needed Covid positive See above Rhabdomyolysis CPK 2980 Patient was likely down on the ground for much longer than  what she is reporting Continue aggressive hydration Recheck CPK in the a.m. Lactic acidosis Improving from 4.8-2.7 Continue hydration as above Secondary to dehydration Anion gap acidosis.  Gap of 19 VBG pending Possible syncope Patient reports no loss of consciousness but it seems that she has lost time, thinking she was only on the ground for 15 minutes but having a significantly increased CPK and significant dehydration Echo, ultrasound carotid duplex Check orthostatic vitals -patient may have tried to stand up from the toilet become orthostatic and passed out on the floor, also possible that she had a vasovagal event Monitor on telemetry   DVT Prophylaxis-Heparin- SCDs   AM Labs Ordered, also please review Full Orders  Family Communication: No family at bedside  Code Status: Full  Admission status: Observation Time spent in minutes : [place] DO

## 2021-01-16 NOTE — Progress Notes (Addendum)
Pharmacy Antibiotic Note  [name] is a 50 y.o. female admitted on 01/16/2021 with  unknown source .  Pharmacy has been consulted for Aztreonam and Vancomycin dosing. Patient with acute kidney injury, rhabdomyolysis, +COVID(not hypoxic), elevated PCT 18.56. Patient reports seizure. Cxray with no active dx, . Urine drug screen + THC.   Plan: Vancomycin 1524m IV loading dose, then 1gm IV q48h for AUC 588 Aztreonam 2gm IV loading dose, then 1gm IV q8h Also on metronidazole 5033mIV q12h F/u cxs and clinical progress Monitor V/S, labs and levels as indicated  Height: 5' 6"  (167.6 cm) Weight: 65.8 kg (145 lb) IBW/kg (Calculated) : 59.3  Temp (24hrs), Avg:98.2 F (36.8 C), Min:97.8 F (36.6 C), Max:98.5 F (36.9 C)  Recent Labs  Lab 01/16/21 0041 01/16/21 0320 01/16/21 0823  WBC 13.5*  --   --   CREATININE 5.36*  --  3.53*  LATICACIDVEN 4.8* 2.7* 4.0*    Estimated Creatinine Clearance: 17.7 mL/min (A) (by C-G formula based on SCr of 3.53 mg/dL (H)).    Allergies  Allergen Reactions   Codeine Shortness Of Breath, Swelling and Rash    Throat swelling   Dilaudid [Hydromorphone Hcl] Shortness Of Breath and Swelling   Hydrocodone Shortness Of Breath, Swelling and Rash   Morphine And Related Shortness Of Breath, Swelling and Rash   Oxycodone Shortness Of Breath, Swelling and Rash    Patient states ALL pain medications make her deathly sick.   Penicillins Shortness Of Breath, Swelling and Other (See Comments)    Has patient had a PCN reaction causing immediate rash, facial/tongue/throat swelling, SOB or lightheadedness with hypotension: Yes Has patient had a PCN reaction causing severe rash involving mucus membranes or skin necrosis: Yes Has patient had a PCN reaction that required hospitalization Yes Has patient had a PCN reaction occurring within the last 10 years: No If all of the above answers are "NO", then may proceed with Cephalosporin use.   Throat swells     Acetaminophen     Per MD patient states she can't take Tylenol because of liver enzymes   Ativan [Lorazepam] Other (See Comments)    Migraines.   Strawberry Extract Swelling   Watermelon Concentrate Swelling   Benadryl [Diphenhydramine Hcl] Palpitations   Latex Rash    Antimicrobials this admission: Vancomycin 9/16 >>  Aztreonam 9/16 >>  Metronidazole 9/16>>  Microbiology results: 9/16 BCx: pending 9/16 UCx: pending  9/16 MRSA PCR: pending 9/16 COVID +  Thank you for allowing pharmacy to be a part of this patient's care.  LoIsac SarnaBS Pharm D, BCCalifornialinical Pharmacist Pager #[phone]/16/2022 10:45 AM

## 2021-01-16 NOTE — Procedures (Signed)
Patient Name: [name]  MRN: [id number]  Epilepsy Attending: Lora Havens  Referring Physician/Provider: Dr Clearence Ped, [place] B Date: 01/16/2021 Duration: 22.32 mins  Patient history: 50 yo F with AMS and metabolic changes, Likely due to xanax withdrawal seizures. EEG to evaluate for seizure  Level of alertness: Awake  AEDs during EEG study: LEV, GBP  Technical aspects: This EEG study was done with scalp electrodes positioned according to the 10-20 International system of electrode placement. Electrical activity was acquired at a sampling rate of 500Hz  and reviewed with a high frequency filter of 70Hz  and a low frequency filter of 1Hz . EEG data were recorded continuously and digitally stored.   Description: The posterior dominant rhythm consists of 9 Hz activity of moderate voltage (25-35 uV) seen predominantly in posterior head regions, symmetric and reactive to eye opening and eye closing. EEG showed intermittent generalized 5 to 6 Hz theta slowing. Hyperventilation and photic stimulation were not performed.     ABNORMALITY - Intermittent slow, generalized  IMPRESSION: This study is suggestive of mild diffuse encephalopathy, nonspecific etiology. No seizures or epileptiform discharges were seen throughout the recording.  [name] [name]

## 2021-01-16 NOTE — ED Provider Notes (Signed)
[facility] Emergency Department Provider Note MRN:  [id number]  Arrival date & time: 01/16/21     Chief Complaint   Seizures   History of Present Illness   [name] is a 50 y.o. year-old female with a history of collagen vascular disease, COPD, seizure disorder, diabetes presenting to the ED with chief complaint of seizure.  Patient dropped off by a friend.  It is suspected the patient has been having seizures today.  Patient is hypotensive and with altered mental status.  I was unable to obtain an accurate HPI, PMH, or ROS due to the patient's altered mental status.  Level 5 caveat.  Review of Systems  Positive for seizures, altered mental status.  Patient's Health History    Past Medical History:  Diagnosis Date   Anxiety    Asthma    Chronic low back pain    Collagen vascular disease (HCC)    COPD (chronic obstructive pulmonary disease) (HCC)    Crohn's disease (HCC)    GERD (gastroesophageal reflux disease)    EGD 01/2007 by Dr.Rourke small hiatal hernia s/p 56 french maloney    History of head injury    Hypertension    IBS (irritable bowel syndrome)    PSVT (paroxysmal supraventricular tachycardia) (HCC)    PTSD (post-traumatic stress disorder)    Recurrent chest pain    Seizure disorder ([place])    Type 2 diabetes mellitus ([place])     Past Surgical History:  Procedure Laterality Date   ABDOMINAL HYSTERECTOMY     with right salpingo oophorectomy 2005   APPENDECTOMY  2006   CESAREAN SECTION     1996   CHOLECYSTECTOMY     COLONOSCOPY  2010   Dr. Gala Romney; negative except for hemorrhoids   ESOPHAGEAL DILATION N/A 10/25/2014   Procedure: ESOPHAGEAL DILATION;  Surgeon: Rogene Houston, MD;  Location: [facility];  Service: Endoscopy;  Laterality: N/A;   ESOPHAGOGASTRODUODENOSCOPY N/A 10/25/2014   Procedure: ESOPHAGOGASTRODUODENOSCOPY (EGD);  Surgeon: Rogene Houston, MD;  Location: [facility];  Service: Endoscopy;  Laterality: N/A;  1250    OOPHORECTOMY     left for torsion and ovarian fibroma; uterine myoma resected 1995    Family History  Problem Relation Age of Onset   Cancer Mother    Heart failure Mother    Hypertension Mother    Heart attack Mother    Heart attack Father 16   Cancer Father    Heart failure Father    Diabetes Father     Social History   Socioeconomic History   Marital status: Divorced    Spouse name: Not on file   Number of children: Not on file   Years of education: Not on file   Highest education level: Not on file  Occupational History   Not on file  Tobacco Use   Smoking status: Some Days    Packs/day: 0.50    Years: 20.00    Pack years: 10.00    Types: Cigarettes    Start date: 09/26/1984   Smokeless tobacco: Never  Substance and Sexual Activity   Alcohol use: No    Alcohol/week: 0.0 standard drinks   Drug use: Not Currently   Sexual activity: Yes    Birth control/protection: Surgical  Other Topics Concern   Not on file  Social History Narrative   Not on file   Social Determinants of Health   Financial Resource Strain: Not on file  Food Insecurity: Not on file  Transportation Needs: Not on file  Physical Activity: Not on file  Stress: Not on file  Social Connections: Not on file  Intimate Partner Violence: Not on file     Physical Exam   Vitals:   01/16/21 0450 01/16/21 0540  BP: (!) 84/54 (!) 100/54  Pulse: (!) 101 (!) 101  Resp: (!) 21 17  Temp:  98.4 F (36.9 C)  SpO2: 98% 99%    CONSTITUTIONAL: Ill-appearing, NAD, diaphoretic NEURO: Somnolent, wakes and able to answer yes or no questions EYES:  eyes equal and reactive ENT/NECK:  no LAD, no JVD CARDIO: Tachycardic rate, poorly perfused PULM:  CTAB no wheezing or rhonchi GI/GU:  normal bowel sounds, non-distended, non-tender MSK/SPINE:  No gross deformities, no edema SKIN:  no rash, atraumatic PSYCH: Unable to assess  *Additional and/or pertinent findings included in MDM below  Diagnostic and  Interventional Summary    EKG Interpretation  Date/Time:  Friday January 16 2021 00:13:56 EDT Ventricular Rate:  108 PR Interval:  143 QRS Duration: 109 QT Interval:  407 QTC Calculation: 546 R Axis:   73 Text Interpretation: Sinus tachycardia Biatrial enlargement Probable lateral infarct, old Prolonged QT interval Confirmed by Gerlene Fee [phone]) on 01/16/2021 12:55:07 AM       Labs Reviewed  RESP PANEL BY RT-PCR (FLU A&B, COVID) ARPGX2 - Abnormal; Notable for the following components:      Result Value   SARS Coronavirus 2 by RT PCR POSITIVE (*)    All other components within normal limits  CBC - Abnormal; Notable for the following components:   WBC 13.5 (*)    All other components within normal limits  COMPREHENSIVE METABOLIC PANEL - Abnormal; Notable for the following components:   Sodium 133 (*)    Potassium 3.4 (*)    CO2 16 (*)    Glucose, Bld 138 (*)    BUN 42 (*)    Creatinine, Ser 5.36 (*)    Calcium 8.1 (*)    AST 79 (*)    Alkaline Phosphatase 195 (*)    GFR, Estimated 9 (*)    Anion gap 19 (*)    All other components within normal limits  LACTIC ACID, PLASMA - Abnormal; Notable for the following components:   Lactic Acid, Venous 4.8 (*)    All other components within normal limits  ACETAMINOPHEN LEVEL - Abnormal; Notable for the following components:   Acetaminophen (Tylenol), Serum <10 (*)    All other components within normal limits  SALICYLATE LEVEL - Abnormal; Notable for the following components:   Salicylate Lvl <3.6 (*)    All other components within normal limits  CK - Abnormal; Notable for the following components:   Total CK 2,980 (*)    All other components within normal limits  URINALYSIS, ROUTINE W REFLEX MICROSCOPIC - Abnormal; Notable for the following components:   APPearance HAZY (*)    Hgb urine dipstick LARGE (*)    Protein, ur 30 (*)    Bacteria, UA RARE (*)    All other components within normal limits  LACTIC ACID, PLASMA -  Abnormal; Notable for the following components:   Lactic Acid, Venous 2.7 (*)    All other components within normal limits  RAPID URINE DRUG SCREEN, HOSP PERFORMED - Abnormal; Notable for the following components:   Tetrahydrocannabinol POSITIVE (*)    All other components within normal limits  MRSA NEXT GEN BY PCR, NASAL  CULTURE, BLOOD (ROUTINE X 2)  CULTURE, BLOOD (ROUTINE X 2)  ETHANOL  PREGNANCY, URINE  HIV ANTIBODY (ROUTINE TESTING W REFLEX)  COMPREHENSIVE METABOLIC PANEL  MAGNESIUM  LACTIC ACID, PLASMA  LACTIC ACID, PLASMA  CBC WITH DIFFERENTIAL/PLATELET  BLOOD GAS, VENOUS  PROCALCITONIN    CT HEAD WO CONTRAST (5MM)  Final Result    DG Chest Port 1 View  Final Result    US Carotid Bilateral    (Results Pending)    Medications  lactated ringers infusion ( Intravenous Rate/Dose Change 01/16/21 0323)  ALPRAZolam (XANAX) tablet 1 mg (has no administration in time range)  amitriptyline (ELAVIL) tablet 75 mg (has no administration in time range)  hydrOXYzine (ATARAX/VISTARIL) tablet 25 mg (has no administration in time range)  pantoprazole (PROTONIX) EC tablet 40 mg (has no administration in time range)  ondansetron (ZOFRAN-ODT) disintegrating tablet 8 mg (has no administration in time range)  pregabalin (LYRICA) capsule 100 mg (has no administration in time range)  LORazepam (ATIVAN) tablet 1-4 mg (has no administration in time range)    Or  LORazepam (ATIVAN) injection 1-4 mg (has no administration in time range)  thiamine tablet 100 mg (has no administration in time range)    Or  thiamine (B-1) injection 100 mg (has no administration in time range)  folic acid (FOLVITE) tablet 1 mg (has no administration in time range)  multivitamin with minerals tablet 1 tablet (has no administration in time range)  LORazepam (ATIVAN) injection 0-4 mg (0 mg Intravenous Not Given 01/16/21 0545)    Followed by  LORazepam (ATIVAN) injection 0-4 mg (has no administration in time range)   heparin injection 5,000 Units (5,000 Units Subcutaneous Given 01/16/21 0605)  acetaminophen (TYLENOL) tablet 650 mg (has no administration in time range)    Or  acetaminophen (TYLENOL) suppository 650 mg (has no administration in time range)  ondansetron (ZOFRAN) tablet 4 mg (has no administration in time range)    Or  ondansetron (ZOFRAN) injection 4 mg (has no administration in time range)  nicotine (NICODERM CQ - dosed in mg/24 hours) patch 14 mg (has no administration in time range)  albuterol (VENTOLIN HFA) 108 (90 Base) MCG/ACT inhaler 2 puff (has no administration in time range)  sodium chloride 0.9 % bolus 1,000 mL (0 mLs Intravenous Stopped 01/16/21 0219)  sodium chloride 0.9 % bolus 1,000 mL (0 mLs Intravenous Stopped 01/16/21 0219)  lactated ringers bolus 1,000 mL (0 mLs Intravenous Stopped 01/16/21 0219)  naloxone (NARCAN) 0.4 MG/ML injection (0.4 mg  Given 01/16/21 0123)     Procedures  /  Critical Care .Critical Care Performed by: Maudie Flakes, MD Authorized by: Maudie Flakes, MD   Critical care provider statement:    Critical care time (minutes):  45   Critical care was necessary to treat or prevent imminent or life-threatening deterioration of the following conditions:  Shock   Critical care was time spent personally by me on the following activities:  Discussions with consultants, evaluation of patient's response to treatment, examination of patient, ordering and performing treatments and interventions, ordering and review of laboratory studies, ordering and review of radiographic studies, pulse oximetry, re-evaluation of patient's condition, obtaining history from patient or surrogate and review of old charts  ED Course and Medical Decision Making  I have reviewed the triage vital signs, the nursing notes, and pertinent available records from the EMR.  Listed above are laboratory and imaging tests that I personally ordered, reviewed, and interpreted and then considered  in my medical decision making (see below for details).  Patient is ill-appearing  and profoundly hypotensive on arrival, seems to be improving with IV fluids, able to converse and answer yes/no questions.  She is moving all extremities.  Report of seizure activity at home, history of seizure disorder.  Awaiting labs, CT head, will monitor hemodynamics closely.     Work-up reveals significant acute kidney injury, likely prerenal in the setting of dehydration, hypotension.  No infectious findings or symptoms, no fever, admitted to hospital service for further care.  Barth Kirks. Sedonia Small, MD [place] [email or web address]  Final Clinical Impressions(s) / ED Diagnoses     ICD-10-CM   1. Hypovolemic shock (HCC)  R57.1     2. Syncope  R55 US Carotid Bilateral    US Carotid Bilateral    3. Acute kidney injury ([place])  N17.9       ED Discharge Orders     None        Discharge Instructions Discussed with and Provided to Patient:   Discharge Instructions   None       Maudie Flakes, MD 01/16/21 [phone]

## 2021-01-16 NOTE — ED Notes (Signed)
[facility] and [facility]Bero aware of v/s at this time .[place] Mitton

## 2021-01-16 NOTE — Progress Notes (Signed)
AMS and metabolic changes: Likely due to xanax withdrawal seizures similar to other events she has had.

## 2021-01-16 NOTE — Consult Note (Signed)
[place] A. Merlene Laughter, MD     [email or web address]          [name] is an 50 y.o. female.   ASSESSMENT/PLAN: AMS and metabolic changes: Likely due to xanax withdrawal seizures similar to other events she has had.  Elevated CK is likely due to seizures     The history is obtained from the patient and the significant other. The patient is quite disoriented and confused and cannot provide adequate history. She has given conflicting stories as to why she is ill. She does relate that she ran out of her Xanax about 3-4 days ago. The patient was found by her significant other confused and on the floor. He thought that she may have had a seizure. She previously has had events similar to this current event with seizures and confusion related to the patient run out of her alprazolam. She has been worked up in the past for the seizure activity and EEG has been negative.  It does not appear that she has had oral trauma. There are no reports of focal neurological deficits.The review systems is unreliable given the confusion.     GENERAL:  She is somewhat disheveled but in no acute distress. She is somewhat anxious and irritated.  HEENT:  Neck is supple no trauma noted.  ABDOMEN: soft  EXTREMITIES: No edema   BACK: Normal  SKIN: Normal by inspection.    MENTAL STATUS:  She is awake and knows she is in the hospital. She is not otherwise oriented to time or her medical condition. She does follow commands well. No dysarthria is noted.   CRANIAL NERVES: Pupils are equal, round and reactive to light and accomodation; extra ocular movements are full, there is no significant nystagmus; visual fields are full; upper and lower facial muscles are normal in strength and symmetric, there is no flattening of the nasolabial folds; tongue is midline; uvula is midline; shoulder elevation is normal.  MOTOR: Normal tone, bulk and strength; no pronator drift.  COORDINATION: Left finger  to nose is normal, right finger to nose is normal, No rest tremor; no intention tremor; no postural tremor; no bradykinesia.  REFLEXES: Deep tendon reflexes are symmetrical and normal. Plantar reflexes are flexor bilaterally.   SENSATION: Normal to pain.      (((((([facility] NOTE 08-2019 50 y/o female presents for seizure consultations. Referred by [facility]. Referral notes reviewed. [name] ED notes reviewed as well.  07/30/2019 she had a seizure. She was witnessed in the recliner by her grandchildren, unresponsive, eyes rolled back, and intermittent abnormal jerking to the extremities. The patient has no recollection of events. There was no tongue biting. No urinary incontinence. Apparently the patient had vomited as well. It is unclear how long it lasted.  911 was called and CBG was normal. At the ED she was given IV fluids and not started on any antiepileptic medications. No EEG was performed. Head CT was normal. Urine TOX screen showed + benzodiazepines and + THC but negative for the other tested substances. Nondetectable ETOH level. Na 134. Glucose 198. Ca 9.8.  Patient report in the days leading up to the event she was begin very active in the heat helping neighbors and probably dehydrated. She also said she had been feeling poorly from Crohns disease flare up with nausea, vomiting, and occasional diarrhea. The morning of the event she actually woke up feeling better. She also reports that she had started taking her benzodiazepines more regularly due  to stress. Prescribed 48m three times a day as needed, however she stopped taking this for a few days. Urine drug screen however was positive for benzodiazepines and THC.  She has had a seizure in the past (about a total of 3) without formal diagnosis/evaluation and none since 2010. Reports occasional jerking of the extremities while alert and conscious. Reports forceps being used with her birth otherwise  uncomplicated. Reports developing normal. Denies known h/o serious CNS infection as a baby or child.  Reports head trauma in 2009 when she was flung off a horse and since then she does not ride horses anymore. Otherwise, she says she played sports in school and had minor head insults from this.  Denies known family h/o seizures.  Currently uninsured but says she is due to have insurance in May.  Physical Exam unremarkable.))))))))))            Blood pressure (!) 122/103, pulse (!) 116, temperature 97.8 F (36.6 C), temperature source Oral, resp. rate (!) 24, height 5' 6"  (1.676 m), weight 65.8 kg, SpO2 98 %.  Past Medical History:  Diagnosis Date   Anxiety    Asthma    Chronic low back pain    Collagen vascular disease (HCC)    COPD (chronic obstructive pulmonary disease) (HCC)    Crohn's disease (HCC)    GERD (gastroesophageal reflux disease)    EGD 01/2007 by Dr.Rourke small hiatal hernia s/p 56 french maloney    History of head injury    Hypertension    IBS (irritable bowel syndrome)    PSVT (paroxysmal supraventricular tachycardia) (HCC)    PTSD (post-traumatic stress disorder)    Recurrent chest pain    Seizure disorder ([facility]    Type 2 diabetes mellitus ([facility]     Past Surgical History:  Procedure Laterality Date   ABDOMINAL HYSTERECTOMY     with right salpingo oophorectomy 2005   APPENDECTOMY  2006   CESAREAN SECTION     1996   CHOLECYSTECTOMY     COLONOSCOPY  2010   Dr. RGala Romney negative except for hemorrhoids   ESOPHAGEAL DILATION N/A 10/25/2014   Procedure: ESOPHAGEAL DILATION;  Surgeon: NRogene Houston MD;  [facility];  Service: Endoscopy;  Laterality: N/A;   ESOPHAGOGASTRODUODENOSCOPY N/A 10/25/2014   Procedure: ESOPHAGOGASTRODUODENOSCOPY (EGD);  Surgeon: NRogene Houston MD;  [facility];  Service: Endoscopy;  Laterality: N/A;  1250   OOPHORECTOMY     left for torsion and ovarian fibroma; uterine myoma resected 1995     Family History  Problem Relation Age of Onset   Cancer Mother    Heart failure Mother    Hypertension Mother    Heart attack Mother    Heart attack Father 512  Cancer Father    Heart failure Father    Diabetes Father     Social History:  reports that she has been smoking cigarettes. She started smoking about 36 years ago. She has a 10.00 pack-year smoking history. She has never used smokeless tobacco. She reports that she does not currently use drugs. She reports that she does not drink alcohol.  Allergies:  Allergies  Allergen Reactions   Codeine Shortness Of Breath, Swelling and Rash    Throat swelling   Dilaudid [Hydromorphone Hcl] Shortness Of Breath and Swelling   Hydrocodone Shortness Of Breath, Swelling and Rash   Morphine And Related Shortness Of Breath, Swelling and Rash   Oxycodone Shortness Of Breath, Swelling and Rash  Patient states ALL pain medications make her deathly sick.   Penicillins Shortness Of Breath, Swelling and Other (See Comments)    Has patient had a PCN reaction causing immediate rash, facial/tongue/throat swelling, SOB or lightheadedness with hypotension: Yes Has patient had a PCN reaction causing severe rash involving mucus membranes or skin necrosis: Yes Has patient had a PCN reaction that required hospitalization Yes Has patient had a PCN reaction occurring within the last 10 years: No If all of the above answers are "NO", then may proceed with Cephalosporin use.   Throat swells    Acetaminophen     Per MD patient states she can't take Tylenol because of liver enzymes   Ativan [Lorazepam] Other (See Comments)    Migraines.   Strawberry Extract Swelling   Watermelon Concentrate Swelling   Benadryl [Diphenhydramine Hcl] Palpitations   Latex Rash    Medications: Prior to Admission medications   Medication Sig Start Date End Date Taking? Authorizing Provider  ALPRAZolam [name]) 1 MG tablet Take 1 mg by mouth 3 (three) times daily as  needed. 07/11/19   [provider]  amitriptyline (ELAVIL) 75 MG tablet Take 75 mg by mouth at bedtime. 12/16/20   [provider]  cetirizine (ZYRTEC) 10 MG tablet Take 10 mg by mouth daily. 03/30/19   [provider]  cyclobenzaprine (FLEXERIL) 10 MG tablet Take 10 mg by mouth 3 (three) times daily as needed. 07/11/19   [provider]  diphenoxylate-atropine (LOMOTIL) 2.5-0.025 MG tablet Take 1 tablet 4 (four) times daily as needed by mouth for diarrhea or loose stools. 03/17/17   Evalee Jefferson, PA-C  DULoxetine (CYMBALTA) 30 MG capsule Take 30 mg by mouth daily. 12/15/20   [provider]  ezetimibe (ZETIA) 10 MG tablet Take 10 mg by mouth daily. 12/15/20   [provider]  gabapentin (NEURONTIN) 300 MG capsule Take 300 mg by mouth 3 (three) times daily. 11/20/20   [provider]  hydrOXYzine (ATARAX/VISTARIL) 25 MG tablet Take 1 tablet by mouth at bedtime. 07/11/19   [provider]  levETIRAcetam (KEPPRA) 750 MG tablet Take 750 mg by mouth 2 (two) times daily. 09/30/20   [provider]  nitroGLYCERIN (NITROSTAT) 0.4 MG SL tablet Place 1 tablet (0.4 mg total) under the tongue every 5 (five) minutes as needed for chest pain. 04/15/15   Herminio Commons, MD  omeprazole (PRILOSEC) 40 MG capsule Take 40 mg by mouth daily as needed.  07/21/19   [provider]  ondansetron (ZOFRAN ODT) 8 MG disintegrating tablet Take 1 tablet (8 mg total) every 8 (eight) hours as needed by mouth for nausea or vomiting. 03/17/17   Evalee Jefferson, PA-C  pregabalin (LYRICA) 100 MG capsule Take 100 mg by mouth 3 (three) times daily. 12/16/20   [provider]  promethazine (PHENERGAN) 25 MG tablet Take 1 tablet (25 mg total) by mouth every 6 (six) hours as needed for nausea or vomiting. 07/30/19   Noemi Chapel, MD  [facility] RESPIMAT 2.5 MCG/ACT AERS SMARTSIG:2 Puff(s) Via Inhaler Daily 12/12/20   [provider]     Scheduled Meds:  amitriptyline  75 mg Oral QHS   Chlorhexidine Gluconate Cloth  6 each Topical Daily   DULoxetine  30 mg Oral Daily   ezetimibe  10 mg Oral Daily   folic acid  1 mg Oral Daily   gabapentin  300 mg Oral TID   heparin  5,000 Units Subcutaneous Q8H   hydrOXYzine  25 mg Oral  QHS   levETIRAcetam  750 mg Oral BID   LORazepam  0-4 mg Intravenous Q6H   Followed by   Derrill Memo ON 01/18/2021] LORazepam  0-4 mg Intravenous Q12H   multivitamin with minerals  1 tablet Oral Daily   nicotine  14 mg Transdermal Daily   pantoprazole  40 mg Oral Daily   potassium chloride  40 mEq Oral BID   sodium bicarbonate  650 mg Oral TID   thiamine  100 mg Oral Daily   Or   thiamine  100 mg Intravenous Daily   Continuous Infusions:  aztreonam     lactated ringers Stopped (01/16/21 0459)   metronidazole Stopped (01/16/21 1257)   [START ON 01/18/2021] vancomycin     PRN Meds:.acetaminophen **OR** acetaminophen, albuterol, ALPRAZolam, LORazepam **OR** LORazepam, ondansetron **OR** ondansetron (ZOFRAN) IV, ondansetron     Results for orders placed or performed during the hospital encounter of 01/16/21 (from the past 48 hour(s))  CBC     Status: Abnormal   Collection Time: 01/16/21 12:41 AM  Result Value Ref Range   WBC 13.5 (H) 4.0 - 10.5 K/uL   RBC 4.53 3.87 - 5.11 MIL/uL   Hemoglobin 13.9 12.0 - 15.0 g/dL   HCT 43.4 36.0 - 46.0 %   MCV 95.8 80.0 - 100.0 fL   MCH 30.7 26.0 - 34.0 pg   MCHC 32.0 30.0 - 36.0 g/dL   RDW 14.2 11.5 - 15.5 %   Platelets 319 150 - 400 K/uL   nRBC 0.0 0.0 - 0.2 %    Comment: Performed at [facility], [address] [place], [place] [address]  Comprehensive metabolic panel     Status: Abnormal   Collection Time: 01/16/21 12:41 AM  Result Value Ref Range   Sodium 133 (L) 135 - 145 mmol/L   Potassium 3.4 (L) 3.5 - 5.1 mmol/L   Chloride 98 98 - 111 mmol/L   CO2 16 (L) 22 - 32 mmol/L   Glucose, Bld 138 (H) 70 - 99 mg/dL    Comment: Glucose reference range  applies only to samples taken after fasting for at least 8 hours.   BUN 42 (H) 6 - 20 mg/dL   Creatinine, Ser 5.36 (H) 0.44 - 1.00 mg/dL   Calcium 8.1 (L) 8.9 - 10.3 mg/dL   Total Protein 6.9 6.5 - 8.1 g/dL   Albumin 3.5 3.5 - 5.0 g/dL   AST 79 (H) 15 - 41 U/L   ALT 38 0 - 44 U/L   Alkaline Phosphatase 195 (H) 38 - 126 U/L   Total Bilirubin 0.4 0.3 - 1.2 mg/dL   GFR, Estimated 9 (L) >60 mL/min    Comment: (NOTE) Calculated using the CKD-EPI Creatinine Equation (2021)    Anion gap 19 (H) 5 - 15    Comment: Performed at [facility], [address] [place], [place] [address]  Lactic acid, plasma     Status: Abnormal   Collection Time: 01/16/21 12:41 AM  Result Value Ref Range   Lactic Acid, Venous 4.8 (HH) 0.5 - 1.9 mmol/L    Comment: Brame,M@0105  by Matthews, B 9.16.22 Performed at [facility], [address] [place], [place] [address]   Ethanol     Status: None   Collection Time: 01/16/21 12:41 AM  Result Value Ref Range   Alcohol, Ethyl (B) <10 <10 mg/dL    Comment: (NOTE) Lowest detectable limit for serum alcohol is 10 mg/dL.  For medical purposes only. Performed at [facility], [address],  [place], [place] [address]   Acetaminophen level     Status: Abnormal   Collection Time: 01/16/21 12:41 AM  Result Value Ref Range   Acetaminophen (Tylenol), Serum <10 (L) 10 - 30 ug/mL    Comment: (NOTE) Therapeutic concentrations vary significantly. A range of 10-30 ug/mL  may be an effective concentration for many patients. However, some  are best treated at concentrations outside of this range. Acetaminophen concentrations >150 ug/mL at 4 hours after ingestion  and >50 ug/mL at 12 hours after ingestion are often associated with  toxic reactions.  Performed at [facility], [address], [place], [place] [address]   Salicylate level     Status: Abnormal   Collection Time: 01/16/21 12:41 AM  Result Value Ref Range   Salicylate Lvl <0.9 (L) 7.0 - 30.0 mg/dL    Comment:  Performed at [facility], [address] [place], [place] [address]  CK     Status: Abnormal   Collection Time: 01/16/21 12:41 AM  Result Value Ref Range   Total CK 2,980 (H) 38 - 234 U/L    Comment: Performed at [facility], [address], [place], [place] [address]  Resp Panel by RT-PCR (Flu A&B, Covid) Nasopharyngeal Swab     Status: Abnormal   Collection Time: 01/16/21  2:32 AM   Specimen: Nasopharyngeal Swab; Nasopharyngeal(NP) swabs in vial transport medium  Result Value Ref Range   SARS Coronavirus 2 by RT PCR POSITIVE (A) NEGATIVE    Comment: RESULT CALLED TO, READ BACK BY AND VERIFIED WITH: HEARN,J@0536  by Matthews,b 9.16.22 (NOTE) SARS-CoV-2 target nucleic acids are DETECTED.  The SARS-CoV-2 RNA is generally detectable in upper respiratory specimens during the acute phase of infection. Positive results are indicative of the presence of the identified virus, but do not rule out bacterial infection or co-infection with other pathogens not detected by the test. Clinical correlation with patient history and other diagnostic information is necessary to determine patient infection status. The expected result is Negative.  Fact Sheet for Patients: EntrepreneurPulse.com.au  Fact Sheet for Healthcare Providers: IncredibleEmployment.be  This test is not yet approved or cleared by the [place] FDA and  has been authorized for detection and/or diagnosis of SARS-CoV-2 by FDA under an Emergency Use Authorization (EUA).  This EUA will remain in effect (meaning this test can b e used) for the duration of  the COVID-19 declaration under Section 564(b)(1) of the Act, 21 U.S.C. section 360bbb-3(b)(1), unless the authorization is terminated or revoked sooner.     Influenza A by PCR NEGATIVE NEGATIVE   Influenza B by PCR NEGATIVE NEGATIVE    Comment: (NOTE) The Xpert Xpress SARS-CoV-2/FLU/RSV plus assay is intended as an aid in the diagnosis  of influenza from Nasopharyngeal swab specimens and should not be used as a sole basis for treatment. Nasal washings and aspirates are unacceptable for Xpert Xpress SARS-CoV-2/FLU/RSV testing.  Fact Sheet for Patients: EntrepreneurPulse.com.au  Fact Sheet for Healthcare Providers: IncredibleEmployment.be  This test is not yet approved or cleared by the [place] FDA and has been authorized for detection and/or diagnosis of SARS-CoV-2 by FDA under an Emergency Use Authorization (EUA). This EUA will remain in effect (meaning this test can be used) for the duration of the COVID-19 declaration under Section 564(b)(1) of the Act, 21 U.S.C. section 360bbb-3(b)(1), unless the authorization is terminated or revoked.  Performed at [facility], [address], [place], [place] [address]   Urinalysis, Routine w reflex microscopic Urine, In & Out Cath  Status: Abnormal   Collection Time: 01/16/21  2:56 AM  Result Value Ref Range   Color, Urine YELLOW YELLOW   APPearance HAZY (A) CLEAR   Specific Gravity, Urine 1.008 1.005 - 1.030   pH 6.0 5.0 - 8.0   Glucose, UA NEGATIVE NEGATIVE mg/dL   Hgb urine dipstick LARGE (A) NEGATIVE   Bilirubin Urine NEGATIVE NEGATIVE   Ketones, ur NEGATIVE NEGATIVE mg/dL   Protein, ur 30 (A) NEGATIVE mg/dL   Nitrite NEGATIVE NEGATIVE   Leukocytes,Ua NEGATIVE NEGATIVE   RBC / HPF 0-5 0 - 5 RBC/hpf   WBC, UA 0-5 0 - 5 WBC/hpf   Bacteria, UA RARE (A) NONE SEEN   Squamous Epithelial / LPF 0-5 0 - 5   Mucus PRESENT    Hyaline Casts, UA PRESENT     Comment: Performed at [facility], [address], [place], [place] [address]  Pregnancy, urine     Status: None   Collection Time: 01/16/21  2:56 AM  Result Value Ref Range   Preg Test, Ur NEGATIVE NEGATIVE    Comment:        THE SENSITIVITY OF THIS METHODOLOGY IS >20 mIU/mL. Performed at [facility], [address], [place], [place] [address]   Rapid urine drug screen  (hospital performed)     Status: Abnormal   Collection Time: 01/16/21  2:59 AM  Result Value Ref Range   Opiates NONE DETECTED NONE DETECTED   Cocaine NONE DETECTED NONE DETECTED   Benzodiazepines NONE DETECTED NONE DETECTED   Amphetamines NONE DETECTED NONE DETECTED   Tetrahydrocannabinol POSITIVE (A) NONE DETECTED   Barbiturates NONE DETECTED NONE DETECTED    Comment: (NOTE) DRUG SCREEN FOR MEDICAL PURPOSES ONLY.  IF CONFIRMATION IS NEEDED FOR ANY PURPOSE, NOTIFY LAB WITHIN 5 DAYS.  LOWEST DETECTABLE LIMITS FOR URINE DRUG SCREEN Drug Class                     Cutoff (ng/mL) Amphetamine and metabolites    1000 Barbiturate and metabolites    200 Benzodiazepine                 027 Tricyclics and metabolites     300 Opiates and metabolites        300 Cocaine and metabolites        300 THC                            50 Performed at [facility], [address], [place], [place] [address]   Lactic acid, plasma     Status: Abnormal   Collection Time: 01/16/21  3:20 AM  Result Value Ref Range   Lactic Acid, Venous 2.7 (HH) 0.5 - 1.9 mmol/L    Comment: CRITICAL VALUE NOTED.  VALUE IS CONSISTENT WITH PREVIOUSLY REPORTED AND CALLED VALUE. Performed at [facility], [address], [place], [place] [address]   MRSA Next Gen by PCR, Nasal     Status: None   Collection Time: 01/16/21  5:11 AM   Specimen: Nasal Mucosa; Nasal Swab  Result Value Ref Range   MRSA by PCR Next Gen NOT DETECTED NOT DETECTED    Comment: (NOTE) The GeneXpert MRSA Assay (FDA approved for NASAL specimens only), is one component of a comprehensive MRSA colonization surveillance program. It is not intended to diagnose MRSA infection nor to guide or monitor treatment for MRSA infections. Test performance is not FDA approved in patients less than  37 years old. Performed at [facility], [address], [place], [place] [address]   HIV Antibody (routine testing w rflx)     Status: None   Collection Time: 01/16/21   8:23 AM  Result Value Ref Range   HIV Screen 4th Generation wRfx Non Reactive Non Reactive    Comment: Performed at [facility], [place] [address] [place], [place] [address]  Lactic acid, plasma     Status: Abnormal   Collection Time: 01/16/21  8:23 AM  Result Value Ref Range   Lactic Acid, Venous 4.0 (HH) 0.5 - 1.9 mmol/L    Comment: CRITICAL RESULT CALLED TO, READ BACK BY AND VERIFIED WITH: LOOMIS,K AT 9:00AM ON 01/16/21 BY [facility] Performed at [facility], [address], [place], [place] [address]   Blood gas, venous     Status: Abnormal   Collection Time: 01/16/21  8:23 AM  Result Value Ref Range   FIO2 21.00    pH, Ven 7.234 (L) 7.250 - 7.430   pCO2, Ven 40.4 (L) 44.0 - 60.0 mmHg   pO2, Ven <31.0 (LL) 32.0 - 45.0 mmHg    Comment: CRITICAL RESULT CALLED TO, READ BACK BY AND VERIFIED WITH: SHEREA COVINGTON @ 0842 ON 01/16/21 C VARNER    Bicarbonate 15.3 (L) 20.0 - 28.0 mmol/L   Acid-base deficit 9.6 (H) 0.0 - 2.0 mmol/L   O2 Saturation 37.8 %   Patient temperature 36.5     Comment: Performed at [facility], [address], [place], [place] [address]  Procalcitonin - Baseline     Status: None   Collection Time: 01/16/21  8:23 AM  Result Value Ref Range   Procalcitonin 18.54 ng/mL    Comment:        Interpretation: PCT >= 10 ng/mL: Important systemic inflammatory response, almost exclusively due to severe bacterial sepsis or septic shock. (NOTE)       Sepsis PCT Algorithm           Lower Respiratory Tract                                      Infection PCT Algorithm    ----------------------------     ----------------------------         PCT < 0.25 ng/mL                PCT < 0.10 ng/mL          Strongly encourage             Strongly discourage   discontinuation of antibiotics    initiation of antibiotics    ----------------------------     -----------------------------       PCT 0.25 - 0.50 ng/mL            PCT 0.10 - 0.25 ng/mL               OR       >80%  decrease in PCT            Discourage initiation of                                            antibiotics      Encourage discontinuation           of antibiotics    ----------------------------     -----------------------------  PCT >= 0.50 ng/mL              PCT 0.26 - 0.50 ng/mL                AND       <80% decrease in PCT             Encourage initiation of                                             antibiotics       Encourage continuation           of antibiotics    ----------------------------     -----------------------------        PCT >= 0.50 ng/mL                  PCT > 0.50 ng/mL               AND         increase in PCT                  Strongly encourage                                      initiation of antibiotics    Strongly encourage escalation           of antibiotics                                     -----------------------------                                           PCT <= 0.25 ng/mL                                                 OR                                        > 80% decrease in PCT                                      Discontinue / Do not initiate                                             antibiotics  Performed at [facility], [address] [place], [place] [address]   Renal function panel     Status: Abnormal   Collection Time: 01/16/21  8:23 AM  Result Value Ref Range   Sodium 134 (L) 135 - 145 mmol/L   Potassium 3.2 (L) 3.5 - 5.1 mmol/L   Chloride 104 98 - 111 mmol/L   CO2  16 (L) 22 - 32 mmol/L   Glucose, Bld 125 (H) 70 - 99 mg/dL    Comment: Glucose reference range applies only to samples taken after fasting for at least 8 hours.   BUN 34 (H) 6 - 20 mg/dL   Creatinine, Ser 3.53 (H) 0.44 - 1.00 mg/dL    Comment: DELTA CHECK NOTED   Calcium 7.5 (L) 8.9 - 10.3 mg/dL   Phosphorus 3.5 2.5 - 4.6 mg/dL   Albumin 3.0 (L) 3.5 - 5.0 g/dL   GFR, Estimated 15 (L) >60 mL/min    Comment: (NOTE) Calculated using the CKD-EPI  Creatinine Equation (2021)    Anion gap 14 5 - 15    Comment: Performed at [facility], [address], [place], [place] [address]  Culture, blood (routine x 2)     Status: None (Preliminary result)   Collection Time: 01/16/21  8:24 AM   Specimen: BLOOD RIGHT ARM  Result Value Ref Range   Specimen Description      BLOOD RIGHT ARM BOTTLES DRAWN AEROBIC AND ANAEROBIC   Special Requests      Blood Culture adequate volume Performed at [facility], [address], [place], [place] [address]    Culture PENDING    Report Status PENDING     Studies/Results:  HEAD CT IMPRESSION: 1. No acute intracranial abnormality.      CAROTID DOPPLERS IMPRESSION: 1. Mild (1-49%) stenosis proximal left internal carotid artery secondary to trace smooth heterogeneous atherosclerotic plaque. 2. No significant atherosclerotic plaque or evidence of stenosis in the right internal carotid artery. 3. Vertebral arteries are patent with normal antegrade flow.        Dequarius Jeffries A. Merlene Laughter, M.D.  Diplomate, Tax adviser of Psychiatry and Neurology ( Neurology). 01/16/2021, 5:32 PM

## 2021-01-16 NOTE — Consult Note (Signed)
[facility] Nephrology Consultation Note  Requesting MD: Dr Wynetta Emery, [place] Reason for consult: AKI  HPI:  [name] is a 50 y.o. female with history of HTN, DM, COPD, Crohn's disease, GERD, anxiety depression, IBS who was presented with seizure episode at home seen as a consultation for the evaluation of acute kidney injury. The patient has normal serum creatinine level at baseline.  She was reportedly sitting on the toilet attempting to void when she had a seizure episode.  It sounds like she has a history of seizure but not on any medication.  Denies any loss of consciousness or any twitching or extremity movement.  Somehow the stranger found her and brought to the hospital. In the ER, the blood pressure was 72/61, in room air.  The labs showed potassium level 3.4, CO2 16, BUN 42, creatinine level 5.36, CK level 2980, lactic acid 4.8, salicylate and Tylenol level negative.  She was found to have positive COVID.  Admitted for further evaluation.  Chest x-ray and CT head unremarkable.  UA unrevealing.  Urine toxicology with benzos which she has prescription for Xanax.  She was given about 30 L of fluid and then admitted with LR around 200 cc an hour. The patient was alert awake and oriented this morning.  She was getting echocardiogram.  She denied headache, dizziness, nausea vomiting chest pain shortness of breath.  The urine output is recorded around 600 cc overnight.  The repeat lab this morning with creatinine level improving to 3.53, potassium level 3.2. Creatinine, Ser  Date/Time Value Ref Range Status  01/16/2021 08:23 AM 3.53 (H) 0.44 - 1.00 mg/dL Final    Comment:    DELTA CHECK NOTED  01/16/2021 12:41 AM 5.36 (H) 0.44 - 1.00 mg/dL Final  09/11/2020 09:56 AM 0.97 0.44 - 1.00 mg/dL Final  07/30/2019 01:01 PM 0.96 0.44 - 1.00 mg/dL Final    PMHx:   Past Medical History:  Diagnosis Date   Anxiety    Asthma    Chronic low back pain    Collagen vascular disease  (HCC)    COPD (chronic obstructive pulmonary disease) (HCC)    Crohn's disease (HCC)    GERD (gastroesophageal reflux disease)    EGD 01/2007 by Dr.Rourke small hiatal hernia s/p 56 french maloney    History of head injury    Hypertension    IBS (irritable bowel syndrome)    PSVT (paroxysmal supraventricular tachycardia) (HCC)    PTSD (post-traumatic stress disorder)    Recurrent chest pain    Seizure disorder ([place])    Type 2 diabetes mellitus ([place])     Past Surgical History:  Procedure Laterality Date   ABDOMINAL HYSTERECTOMY     with right salpingo oophorectomy 2005   APPENDECTOMY  2006   CESAREAN SECTION     1996   CHOLECYSTECTOMY     COLONOSCOPY  2010   Dr. Gala Romney; negative except for hemorrhoids   ESOPHAGEAL DILATION N/A 10/25/2014   Procedure: ESOPHAGEAL DILATION;  Surgeon: Rogene Houston, MD;  [facility];  Service: Endoscopy;  Laterality: N/A;   ESOPHAGOGASTRODUODENOSCOPY N/A 10/25/2014   Procedure: ESOPHAGOGASTRODUODENOSCOPY (EGD);  Surgeon: Rogene Houston, MD;  [facility];  Service: Endoscopy;  Laterality: N/A;  1250   OOPHORECTOMY     left for torsion and ovarian fibroma; uterine myoma resected 1995    Family Hx:  Family History  Problem Relation Age of Onset   Cancer Mother    Heart failure Mother  Hypertension Mother    Heart attack Mother    Heart attack Father 80   Cancer Father    Heart failure Father    Diabetes Father     Social History:  reports that she has been smoking cigarettes. She started smoking about 36 years ago. She has a 10.00 pack-year smoking history. She has never used smokeless tobacco. She reports that she does not currently use drugs. She reports that she does not drink alcohol.  Allergies:  Allergies  Allergen Reactions   Codeine Shortness Of Breath, Swelling and Rash    Throat swelling   Dilaudid [Hydromorphone Hcl] Shortness Of Breath and Swelling   Hydrocodone Shortness Of Breath, Swelling and Rash    Morphine And Related Shortness Of Breath, Swelling and Rash   Oxycodone Shortness Of Breath, Swelling and Rash    Patient states ALL pain medications make her deathly sick.   Penicillins Shortness Of Breath, Swelling and Other (See Comments)    Has patient had a PCN reaction causing immediate rash, facial/tongue/throat swelling, SOB or lightheadedness with hypotension: Yes Has patient had a PCN reaction causing severe rash involving mucus membranes or skin necrosis: Yes Has patient had a PCN reaction that required hospitalization Yes Has patient had a PCN reaction occurring within the last 10 years: No If all of the above answers are "NO", then may proceed with Cephalosporin use.   Throat swells    Acetaminophen     Per MD patient states she can't take Tylenol because of liver enzymes   Ativan [Lorazepam] Other (See Comments)    Migraines.   Strawberry Extract Swelling   Watermelon Concentrate Swelling   Benadryl [Diphenhydramine Hcl] Palpitations   Latex Rash    Medications: Prior to Admission medications   Medication Sig Start Date End Date Taking? Authorizing Provider  ALPRAZolam [name]) 1 MG tablet Take 1 mg by mouth 3 (three) times daily as needed. 07/11/19   [provider]  amitriptyline (ELAVIL) 75 MG tablet Take 75 mg by mouth at bedtime. 12/16/20   [provider]  cetirizine (ZYRTEC) 10 MG tablet Take 10 mg by mouth daily. 03/30/19   [provider]  cyclobenzaprine (FLEXERIL) 10 MG tablet Take 10 mg by mouth 3 (three) times daily as needed. 07/11/19   [provider]  diphenoxylate-atropine (LOMOTIL) 2.5-0.025 MG tablet Take 1 tablet 4 (four) times daily as needed by mouth for diarrhea or loose stools. 03/17/17   Evalee Jefferson, PA-C  DULoxetine (CYMBALTA) 30 MG capsule Take 30 mg by mouth daily. 12/15/20   [provider]  ezetimibe (ZETIA) 10 MG tablet Take 10 mg by mouth daily. 12/15/20   [provider]  gabapentin  (NEURONTIN) 300 MG capsule Take 300 mg by mouth 3 (three) times daily. 11/20/20   [provider]  hydrOXYzine (ATARAX/VISTARIL) 25 MG tablet Take 1 tablet by mouth at bedtime. 07/11/19   [provider]  levETIRAcetam (KEPPRA) 750 MG tablet Take 750 mg by mouth 2 (two) times daily. 09/30/20   [provider]  nitroGLYCERIN (NITROSTAT) 0.4 MG SL tablet Place 1 tablet (0.4 mg total) under the tongue every 5 (five) minutes as needed for chest pain. 04/15/15   Herminio Commons, MD  omeprazole (PRILOSEC) 40 MG capsule Take 40 mg by mouth daily as needed.  07/21/19   [provider]  ondansetron (ZOFRAN ODT) 8 MG disintegrating tablet Take 1 tablet (8 mg total) every 8 (eight) hours as needed by mouth for nausea or vomiting.  03/17/17   Evalee Jefferson, PA-C  pregabalin (LYRICA) 100 MG capsule Take 100 mg by mouth 3 (three) times daily. 12/16/20   [provider]  promethazine (PHENERGAN) 25 MG tablet Take 1 tablet (25 mg total) by mouth every 6 (six) hours as needed for nausea or vomiting. 07/30/19   Noemi Chapel, MD  SPIRIVA RESPIMAT 2.5 MCG/ACT AERS SMARTSIG:2 Puff(s) Via Inhaler Daily 12/12/20   [provider]    I have reviewed the patient's current medications.  Labs:  Results for orders placed or performed during the hospital encounter of 01/16/21 (from the past 48 hour(s))  CBC     Status: Abnormal   Collection Time: 01/16/21 12:41 AM  Result Value Ref Range   WBC 13.5 (H) 4.0 - 10.5 K/uL   RBC 4.53 3.87 - 5.11 MIL/uL   Hemoglobin 13.9 12.0 - 15.0 g/dL   HCT 43.4 36.0 - 46.0 %   MCV 95.8 80.0 - 100.0 fL   MCH 30.7 26.0 - 34.0 pg   MCHC 32.0 30.0 - 36.0 g/dL   RDW 14.2 11.5 - 15.5 %   Platelets 319 150 - 400 K/uL   nRBC 0.0 0.0 - 0.2 %    Comment: Performed at [facility], [address] [place], [place] [address]  Comprehensive metabolic panel     Status: Abnormal   Collection Time: 01/16/21 12:41 AM  Result Value Ref Range   Sodium  133 (L) 135 - 145 mmol/L   Potassium 3.4 (L) 3.5 - 5.1 mmol/L   Chloride 98 98 - 111 mmol/L   CO2 16 (L) 22 - 32 mmol/L   Glucose, Bld 138 (H) 70 - 99 mg/dL    Comment: Glucose reference range applies only to samples taken after fasting for at least 8 hours.   BUN 42 (H) 6 - 20 mg/dL   Creatinine, Ser 5.36 (H) 0.44 - 1.00 mg/dL   Calcium 8.1 (L) 8.9 - 10.3 mg/dL   Total Protein 6.9 6.5 - 8.1 g/dL   Albumin 3.5 3.5 - 5.0 g/dL   AST 79 (H) 15 - 41 U/L   ALT 38 0 - 44 U/L   Alkaline Phosphatase 195 (H) 38 - 126 U/L   Total Bilirubin 0.4 0.3 - 1.2 mg/dL   GFR, Estimated 9 (L) >60 mL/min    Comment: (NOTE) Calculated using the CKD-EPI Creatinine Equation (2021)    Anion gap 19 (H) 5 - 15    Comment: Performed at [facility], [address], [place], [place] [address]  Lactic acid, plasma     Status: Abnormal   Collection Time: 01/16/21 12:41 AM  Result Value Ref Range   Lactic Acid, Venous 4.8 (HH) 0.5 - 1.9 mmol/L    Comment: Brame,M@0105  by Matthews, B 9.16.22 Performed at [facility], [address], [place], [place] [address]   Ethanol     Status: None   Collection Time: 01/16/21 12:41 AM  Result Value Ref Range   Alcohol, Ethyl (B) <10 <10 mg/dL    Comment: (NOTE) Lowest detectable limit for serum alcohol is 10 mg/dL.  For medical purposes only. Performed at [facility], [address], [place], [place] [address]   Acetaminophen level     Status: Abnormal   Collection Time: 01/16/21 12:41 AM  Result Value Ref Range   Acetaminophen (Tylenol), Serum <10 (L) 10 - 30 ug/mL    Comment: (NOTE) Therapeutic concentrations vary significantly. A range of 10-30 ug/mL  may be an effective concentration for many  patients. However, some  are best treated at concentrations outside of this range. Acetaminophen concentrations >150 ug/mL at 4 hours after ingestion  and >50 ug/mL at 12 hours after ingestion are often associated with  toxic reactions.  Performed at [facility],  [address], [place], [place] [address]   Salicylate level     Status: Abnormal   Collection Time: 01/16/21 12:41 AM  Result Value Ref Range   Salicylate Lvl <9.6 (L) 7.0 - 30.0 mg/dL    Comment: Performed at [facility], [address] [place], [place] [address]  CK     Status: Abnormal   Collection Time: 01/16/21 12:41 AM  Result Value Ref Range   Total CK 2,980 (H) 38 - 234 U/L    Comment: Performed at [facility], [address] [place], [place] [address]  Resp Panel by RT-PCR (Flu A&B, Covid) Nasopharyngeal Swab     Status: Abnormal   Collection Time: 01/16/21  2:32 AM   Specimen: Nasopharyngeal Swab; Nasopharyngeal(NP) swabs in vial transport medium  Result Value Ref Range   SARS Coronavirus 2 by RT PCR POSITIVE (A) NEGATIVE    Comment: RESULT CALLED TO, READ BACK BY AND VERIFIED WITH: HEARN,J@0536  by Matthews,b 9.16.22 (NOTE) SARS-CoV-2 target nucleic acids are DETECTED.  The SARS-CoV-2 RNA is generally detectable in upper respiratory specimens during the acute phase of infection. Positive results are indicative of the presence of the identified virus, but do not rule out bacterial infection or co-infection with other pathogens not detected by the test. Clinical correlation with patient history and other diagnostic information is necessary to determine patient infection status. The expected result is Negative.  Fact Sheet for Patients: EntrepreneurPulse.com.au  Fact Sheet for Healthcare Providers: IncredibleEmployment.be  This test is not yet approved or cleared by the [place] FDA and  has been authorized for detection and/or diagnosis of SARS-CoV-2 by FDA under an Emergency Use Authorization (EUA).  This EUA will remain in effect (meaning this test can b e used) for the duration of  the COVID-19 declaration under Section 564(b)(1) of the Act, 21 U.S.C. section 360bbb-3(b)(1), unless the authorization is terminated or revoked  sooner.     Influenza A by PCR NEGATIVE NEGATIVE   Influenza B by PCR NEGATIVE NEGATIVE    Comment: (NOTE) The Xpert Xpress SARS-CoV-2/FLU/RSV plus assay is intended as an aid in the diagnosis of influenza from Nasopharyngeal swab specimens and should not be used as a sole basis for treatment. Nasal washings and aspirates are unacceptable for Xpert Xpress SARS-CoV-2/FLU/RSV testing.  Fact Sheet for Patients: EntrepreneurPulse.com.au  Fact Sheet for Healthcare Providers: IncredibleEmployment.be  This test is not yet approved or cleared by the [place] FDA and has been authorized for detection and/or diagnosis of SARS-CoV-2 by FDA under an Emergency Use Authorization (EUA). This EUA will remain in effect (meaning this test can be used) for the duration of the COVID-19 declaration under Section 564(b)(1) of the Act, 21 U.S.C. section 360bbb-3(b)(1), unless the authorization is terminated or revoked.  Performed at [facility], [address], [place], [place] [address]   Urinalysis, Routine w reflex microscopic Urine, In & Out Cath     Status: Abnormal   Collection Time: 01/16/21  2:56 AM  Result Value Ref Range   Color, Urine YELLOW YELLOW   APPearance HAZY (A) CLEAR   Specific Gravity, Urine 1.008 1.005 - 1.030   pH 6.0 5.0 - 8.0   Glucose, UA NEGATIVE NEGATIVE mg/dL   Hgb urine dipstick  LARGE (A) NEGATIVE   Bilirubin Urine NEGATIVE NEGATIVE   Ketones, ur NEGATIVE NEGATIVE mg/dL   Protein, ur 30 (A) NEGATIVE mg/dL   Nitrite NEGATIVE NEGATIVE   Leukocytes,Ua NEGATIVE NEGATIVE   RBC / HPF 0-5 0 - 5 RBC/hpf   WBC, UA 0-5 0 - 5 WBC/hpf   Bacteria, UA RARE (A) NONE SEEN   Squamous Epithelial / LPF 0-5 0 - 5   Mucus PRESENT    Hyaline Casts, UA PRESENT     Comment: Performed at [facility], [address], [place], [place] [address]  Pregnancy, urine     Status: None   Collection Time: 01/16/21  2:56 AM  Result Value Ref Range   Preg  Test, Ur NEGATIVE NEGATIVE    Comment:        THE SENSITIVITY OF THIS METHODOLOGY IS >20 mIU/mL. Performed at [facility], [address], [place], [place] [address]   Rapid urine drug screen (hospital performed)     Status: Abnormal   Collection Time: 01/16/21  2:59 AM  Result Value Ref Range   Opiates NONE DETECTED NONE DETECTED   Cocaine NONE DETECTED NONE DETECTED   Benzodiazepines NONE DETECTED NONE DETECTED   Amphetamines NONE DETECTED NONE DETECTED   Tetrahydrocannabinol POSITIVE (A) NONE DETECTED   Barbiturates NONE DETECTED NONE DETECTED    Comment: (NOTE) DRUG SCREEN FOR MEDICAL PURPOSES ONLY.  IF CONFIRMATION IS NEEDED FOR ANY PURPOSE, NOTIFY LAB WITHIN 5 DAYS.  LOWEST DETECTABLE LIMITS FOR URINE DRUG SCREEN Drug Class                     Cutoff (ng/mL) Amphetamine and metabolites    1000 Barbiturate and metabolites    200 Benzodiazepine                 176 Tricyclics and metabolites     300 Opiates and metabolites        300 Cocaine and metabolites        300 THC                            50 Performed at [facility], [address], [place], [place] [address]   Lactic acid, plasma     Status: Abnormal   Collection Time: 01/16/21  3:20 AM  Result Value Ref Range   Lactic Acid, Venous 2.7 (HH) 0.5 - 1.9 mmol/L    Comment: CRITICAL VALUE NOTED.  VALUE IS CONSISTENT WITH PREVIOUSLY REPORTED AND CALLED VALUE. Performed at [facility], [address], [place], [place] [address]   MRSA Next Gen by PCR, Nasal     Status: None   Collection Time: 01/16/21  5:11 AM   Specimen: Nasal Mucosa; Nasal Swab  Result Value Ref Range   MRSA by PCR Next Gen NOT DETECTED NOT DETECTED    Comment: (NOTE) The GeneXpert MRSA Assay (FDA approved for NASAL specimens only), is one component of a comprehensive MRSA colonization surveillance program. It is not intended to diagnose MRSA infection nor to guide or monitor treatment for MRSA infections. Test performance is not FDA  approved in patients less than 52 years old. Performed at [facility], [address], [place], [place] [address]   Lactic acid, plasma     Status: Abnormal   Collection Time: 01/16/21  8:23 AM  Result Value Ref Range   Lactic Acid, Venous 4.0 (HH) 0.5 - 1.9 mmol/L    Comment: CRITICAL  RESULT CALLED TO, READ BACK BY AND VERIFIED WITH: LOOMIS,K AT 9:00AM ON 01/16/21 BY [facility] Performed at [facility], [address] [place], [place] [address]   Blood gas, venous     Status: Abnormal   Collection Time: 01/16/21  8:23 AM  Result Value Ref Range   FIO2 21.00    pH, Ven 7.234 (L) 7.250 - 7.430   pCO2, Ven 40.4 (L) 44.0 - 60.0 mmHg   pO2, Ven <31.0 (LL) 32.0 - 45.0 mmHg    Comment: CRITICAL RESULT CALLED TO, READ BACK BY AND VERIFIED WITH: SHEREA COVINGTON @ 0842 ON 01/16/21 C VARNER    Bicarbonate 15.3 (L) 20.0 - 28.0 mmol/L   Acid-base deficit 9.6 (H) 0.0 - 2.0 mmol/L   O2 Saturation 37.8 %   Patient temperature 36.5     Comment: Performed at [facility], [address] [place], [place] [address]  Procalcitonin - Baseline     Status: None   Collection Time: 01/16/21  8:23 AM  Result Value Ref Range   Procalcitonin 18.54 ng/mL    Comment:        Interpretation: PCT >= 10 ng/mL: Important systemic inflammatory response, almost exclusively due to severe bacterial sepsis or septic shock. (NOTE)       Sepsis PCT Algorithm           Lower Respiratory Tract                                      Infection PCT Algorithm    ----------------------------     ----------------------------         PCT < 0.25 ng/mL                PCT < 0.10 ng/mL          Strongly encourage             Strongly discourage   discontinuation of antibiotics    initiation of antibiotics    ----------------------------     -----------------------------       PCT 0.25 - 0.50 ng/mL            PCT 0.10 - 0.25 ng/mL               OR       >80% decrease in PCT            Discourage initiation of                                             antibiotics      Encourage discontinuation           of antibiotics    ----------------------------     -----------------------------         PCT >= 0.50 ng/mL              PCT 0.26 - 0.50 ng/mL                AND       <80% decrease in PCT             Encourage initiation of  antibiotics       Encourage continuation           of antibiotics    ----------------------------     -----------------------------        PCT >= 0.50 ng/mL                  PCT > 0.50 ng/mL               AND         increase in PCT                  Strongly encourage                                      initiation of antibiotics    Strongly encourage escalation           of antibiotics                                     -----------------------------                                           PCT <= 0.25 ng/mL                                                 OR                                        > 80% decrease in PCT                                      Discontinue / Do not initiate                                             antibiotics  Performed at [facility], [address], [place], [place] [address]   Renal function panel     Status: Abnormal   Collection Time: 01/16/21  8:23 AM  Result Value Ref Range   Sodium 134 (L) 135 - 145 mmol/L   Potassium 3.2 (L) 3.5 - 5.1 mmol/L   Chloride 104 98 - 111 mmol/L   CO2 16 (L) 22 - 32 mmol/L   Glucose, Bld 125 (H) 70 - 99 mg/dL    Comment: Glucose reference range applies only to samples taken after fasting for at least 8 hours.   BUN 34 (H) 6 - 20 mg/dL   Creatinine, Ser 3.53 (H) 0.44 - 1.00 mg/dL    Comment: DELTA CHECK NOTED   Calcium 7.5 (L) 8.9 - 10.3 mg/dL   Phosphorus 3.5 2.5 - 4.6 mg/dL   Albumin 3.0 (L) 3.5 - 5.0 g/dL   GFR, Estimated 15 (L) >60 mL/min    Comment: (NOTE) Calculated using the CKD-EPI Creatinine Equation (2021)  Anion gap 14 5 - 15    Comment: Performed  at [facility], [address], [place], [place] [address]  Culture, blood (routine x 2)     Status: None (Preliminary result)   Collection Time: 01/16/21  8:24 AM   Specimen: BLOOD RIGHT ARM  Result Value Ref Range   Specimen Description      BLOOD RIGHT ARM BOTTLES DRAWN AEROBIC AND ANAEROBIC   Special Requests      Blood Culture adequate volume Performed at [facility], [address] [place], [place] [address]    Culture PENDING    Report Status PENDING      ROS:  Pertinent items noted in HPI and remainder of comprehensive ROS otherwise negative.  Physical Exam: Vitals:   01/16/21 0801 01/16/21 0825  BP: (!) 81/20   Pulse: (!) 107 (!) 109  Resp: (!) 24 17  Temp:  97.8 F (36.6 C)  SpO2: 98% 100%     General exam: Appears calm and comfortable  Respiratory system: Clear to auscultation. Respiratory effort normal. No wheezing or crackle Cardiovascular system: S1 & S2 heard, RRR.  No pedal edema. Gastrointestinal system: Abdomen is nondistended, soft and nontender. Normal bowel sounds heard. Central nervous system: Alert and oriented. No focal neurological deficits. Extremities: no edema or clubbing Skin: No rashes, lesions or ulcers Psychiatry: Judgement and insight appear impaired.  Assessment/Plan:  #Acute kidney injury, nonoliguric due to hemodynamically mediated in the setting of severe hypotension/dehydration concomitant with mild rhabdomyolysis.  UA unrevealing.  I will check kidney ultrasound as a baseline and to rule out obstruction.  The serum creatinine level is already improving with IV hydration.  I agree with continuing fluid, strict ins and out and lab monitoring.  Please avoid nephrotoxic agents including NSAIDs, IV contrast and magnesium or phosphate containing bowel preparation.   #Hypotension: Seems like improving with IV fluid.  Monitor BP.  Not on antihypertensives.  #Hypokalemia: Replete potassium chloride orally.  #Lactic/metabolic acidosis:  Fluid resuscitation as discussed above.  Starting oral sodium bicarbonate.  This can be discontinued after CO2 normalized.  #Hyponatremia, hypovolemic: Improving with IV fluid.  #Acute metabolic encephalopathy/seizure: CT head unremarkable.  May need EEGs, neuro eval; defer to primary team.  #COVID infection with endorgan damage including AKI, lactic acidosis: Not hypoxic.  Thank you for the consult.  We will follow with you.  Discussed with the patient's nurse as well.  We will only review labs over the weekend.  During this time, please call with question.  Shaquanna Lycan Tanna Furry 01/16/2021, 10:00 AM  [facility].

## 2021-01-16 NOTE — ED Notes (Signed)
Patient transported to CT 

## 2021-01-16 NOTE — ED Notes (Signed)
Date and time results received: 01/16/21 1:06 AM  Test: Lactic Acid Critical Value: 4.8  Name of Provider Notified: Dr. Sedonia Small  Orders Received? Or Actions Taken?:

## 2021-01-16 NOTE — Progress Notes (Signed)
EEG Completed; Results Pending  

## 2021-01-17 ENCOUNTER — Inpatient Hospital Stay (HOSPITAL_COMMUNITY): Payer: 59

## 2021-01-17 DIAGNOSIS — U071 COVID-19: Secondary | ICD-10-CM

## 2021-01-17 DIAGNOSIS — N179 Acute kidney failure, unspecified: Secondary | ICD-10-CM | POA: Diagnosis not present

## 2021-01-17 DIAGNOSIS — K219 Gastro-esophageal reflux disease without esophagitis: Secondary | ICD-10-CM

## 2021-01-17 DIAGNOSIS — E119 Type 2 diabetes mellitus without complications: Secondary | ICD-10-CM | POA: Diagnosis not present

## 2021-01-17 DIAGNOSIS — Z720 Tobacco use: Secondary | ICD-10-CM

## 2021-01-17 LAB — CBC WITH DIFFERENTIAL/PLATELET
Abs Immature Granulocytes: 0.44 10*3/uL — ABNORMAL HIGH (ref 0.00–0.07)
Basophils Absolute: 0 10*3/uL (ref 0.0–0.1)
Basophils Relative: 0 %
Eosinophils Absolute: 0.1 10*3/uL (ref 0.0–0.5)
Eosinophils Relative: 1 %
HCT: 37.9 % (ref 36.0–46.0)
Hemoglobin: 12.6 g/dL (ref 12.0–15.0)
Immature Granulocytes: 4 %
Lymphocytes Relative: 7 %
Lymphs Abs: 0.8 10*3/uL (ref 0.7–4.0)
MCH: 30.9 pg (ref 26.0–34.0)
MCHC: 33.2 g/dL (ref 30.0–36.0)
MCV: 92.9 fL (ref 80.0–100.0)
Monocytes Absolute: 0.6 10*3/uL (ref 0.1–1.0)
Monocytes Relative: 6 %
Neutro Abs: 8.8 10*3/uL — ABNORMAL HIGH (ref 1.7–7.7)
Neutrophils Relative %: 82 %
Platelets: 277 10*3/uL (ref 150–400)
RBC: 4.08 MIL/uL (ref 3.87–5.11)
RDW: 13.9 % (ref 11.5–15.5)
WBC: 10.7 10*3/uL — ABNORMAL HIGH (ref 4.0–10.5)
nRBC: 0 % (ref 0.0–0.2)

## 2021-01-17 LAB — LACTIC ACID, PLASMA
Lactic Acid, Venous: 3.6 mmol/L (ref 0.5–1.9)
Lactic Acid, Venous: 2.4 mmol/L (ref 0.5–1.9)
Lactic Acid, Venous: 2.7 mmol/L (ref 0.5–1.9)
Lactic Acid, Venous: 2 mmol/L (ref 0.5–1.9)

## 2021-01-17 LAB — COMPREHENSIVE METABOLIC PANEL
ALT: 32 U/L (ref 0–44)
AST: 67 U/L — ABNORMAL HIGH (ref 15–41)
Albumin: 2.7 g/dL — ABNORMAL LOW (ref 3.5–5.0)
Alkaline Phosphatase: 142 U/L — ABNORMAL HIGH (ref 38–126)
Anion gap: 11 (ref 5–15)
BUN: 15 mg/dL (ref 6–20)
CO2: 20 mmol/L — ABNORMAL LOW (ref 22–32)
Calcium: 8.5 mg/dL — ABNORMAL LOW (ref 8.9–10.3)
Chloride: 103 mmol/L (ref 98–111)
Creatinine, Ser: 1.27 mg/dL — ABNORMAL HIGH (ref 0.44–1.00)
GFR, Estimated: 51 mL/min — ABNORMAL LOW (ref >60)
Glucose, Bld: 118 mg/dL — ABNORMAL HIGH (ref 70–99)
Potassium: 4.6 mmol/L (ref 3.5–5.1)
Sodium: 134 mmol/L — ABNORMAL LOW (ref 135–145)
Total Bilirubin: 0.6 mg/dL (ref 0.3–1.2)
Total Protein: 5.6 g/dL — ABNORMAL LOW (ref 6.5–8.1)

## 2021-01-17 LAB — CK: Total CK: 1451 U/L — ABNORMAL HIGH (ref 38–234)

## 2021-01-17 LAB — PROCALCITONIN: Procalcitonin: 5.16 ng/mL

## 2021-01-17 LAB — MAGNESIUM: Magnesium: 1.8 mg/dL (ref 1.7–2.4)

## 2021-01-17 MED ORDER — LACTATED RINGERS IV BOLUS
1000.0000 mL | Freq: Once | INTRAVENOUS | Status: AC
  Administered 2021-01-17: 1000 mL via INTRAVENOUS

## 2021-01-17 MED ORDER — DOXYCYCLINE HYCLATE 100 MG PO TABS
100.0000 mg | ORAL_TABLET | Freq: Two times a day (BID) | ORAL | Status: DC
  Administered 2021-01-17 – 2021-01-20 (×7): 100 mg via ORAL
  Filled 2021-01-17 (×8): qty 1

## 2021-01-17 NOTE — Progress Notes (Signed)
PROGRESS NOTE   [name]  MAU:[id number] DOB: April 26, 1971 DOA: 01/16/2021 PCP: Teodoro Kil, PA-C   Chief Complaint  Patient presents with   Seizures   Level of care: ICU  Brief Admission History:   50 y.o. female, with history of COPD, Corhn's, GERD, HTN, T2DM, and more presents to the ED with a chief complaint of seizure.  It is unclear how accurate patient's history is.  She reports that she was sitting on the toilet, attempting to void but could not start a stream, when she had a seizure.  When asked how she knew it was a seizure she reports it is because she has had seizures before.  Patient reports that she is supposed to be on seizure medication but she does not know the moderate the dose.  She reports that she remembers the whole thing.  There was no rhythmic jerking or twitching.  She reports she just knows it was a seizure.  Patient reports that she did not lose consciousness and remembers the whole thing.  Somehow, a "stranger" found her and brought her to the hospital per her report.  I discussed with the patient that her labs would indicate that she was laying on the ground for some time, and asked if she had lost time.  Patient reports that she hit the ground several times yesterday because she found out that her mother and father died at the same time in 2 different locations.  She was noted to have severe dehydration, rhabdomyolysis and AKI with severe sepsis of unknown source.    Assessment & Plan:   Principal Problem:   AKI (acute kidney injury) ([place]) Active Problems:   GERD (gastroesophageal reflux disease)   Tobacco abuse   Diabetes mellitus without complication (HCC)   Rhabdomyolysis   Lactic acidosis   COVID-19 virus infection   Severe sepsis (HCC)  Severe sepsis of unknown origin Presented with markedly elevated procalcitonin level and elevated lactic acid level was empirically started on IV antibiotics broad-spectrum.  As procalcitonin is trending down we  are de-escalating antibiotics. Continue supportive measures as ordered.  Follow-up lactic acid 2 improvement  Acute metabolic encephalopathy -Likely secondary to postictal state from withdrawal seizure -Improving with supportive measures -CT did not show any acute abnormalities. 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   Difficult to place patient No  Consultants:  nephrology  Procedures:  N/a  Antimicrobials:  Doxycycline metronidazole  Subjective: Pt says that she is starting to feel a little better, no further seizure activity.  No CP and no SOB.    Objective: Vitals:   01/17/21 0730 01/17/21 0900 01/17/21 1100 01/17/21 1125  BP: (!) 104/51   (!) 115/43  Pulse: (!) 116     Resp: (!) 30   (!)  23  Temp:  97.8 F (36.6 C) 97.6 F (36.4 C)   TempSrc:  Oral Oral   SpO2: 93%     Weight:      Height:        Intake/Output Summary (Last 24 hours) at 01/17/2021 1540 Last data filed at 01/17/2021 1000 Gross per 24 hour  Intake 2439.86 ml  Output 4151 ml  Net -1711.14 ml   Filed Weights   01/16/21 0000 01/17/21 0400  Weight: 65.8 kg 74.7 kg    Examination:  General exam: awake, but somnolent, Appears calm and comfortable  Respiratory system: Clear to auscultation. Respiratory effort normal. Cardiovascular system: normal S1 & S2 heard. No JVD, murmurs, rubs, gallops or clicks. No pedal edema. Gastrointestinal system: Abdomen is nondistended, soft and nontender. No organomegaly or masses felt. Normal bowel sounds heard. Central nervous system: Alert and oriented. No focal neurological deficits. Extremities: Symmetric 5 x 5 power. Skin: No rashes, lesions or ulcers Psychiatry: Judgement and insight appear poor. Mood & affect appropriate.   Data Reviewed: I have personally reviewed following labs and imaging studies  CBC: Recent Labs  Lab 01/16/21 0041 01/17/21 0444  WBC 13.5* 10.7*  NEUTROABS  --  8.8*  HGB 13.9 12.6  HCT 43.4 37.9  MCV 95.8 92.9  PLT 319 786    Basic Metabolic Panel: Recent Labs  Lab 01/16/21 0041 01/16/21 0823 01/17/21 0444  NA 133* 134* 134*  K 3.4* 3.2* 4.6  CL 98 104 103  CO2 16* 16* 20*  GLUCOSE 138* 125* 118*  BUN 42* 34* 15  CREATININE 5.36* 3.53* 1.27*  CALCIUM 8.1* 7.5* 8.5*  MG  --   --  1.8  PHOS  --  3.5  --     GFR: Estimated Creatinine Clearance: 54.2 mL/min (A) (by C-G formula based on SCr of 1.27 mg/dL (H)).  Liver Function Tests: Recent Labs  Lab 01/16/21 0041 01/16/21 0823 01/17/21 0444  AST 79*  --  67*  ALT 38  --  32  ALKPHOS 195*  --  142*  BILITOT 0.4  --  0.6  PROT 6.9  --  5.6*  ALBUMIN 3.5 3.0* 2.7*    CBG: No results for input(s): GLUCAP in the last 168 hours.  Recent Results (from the past 240 hour(s))  Resp Panel by RT-PCR (Flu A&B, Covid) Nasopharyngeal Swab     Status: Abnormal   Collection Time: 01/16/21  2:32 AM   Specimen: Nasopharyngeal Swab; Nasopharyngeal(NP) swabs in vial transport medium  Result Value Ref Range Status   SARS Coronavirus 2 by RT PCR POSITIVE (A) NEGATIVE Final    Comment: RESULT CALLED TO, READ BACK BY AND  VERIFIED WITH: HEARN,J@[id number]  by Matthews,b 9.16.22 (NOTE) SARS-CoV-2 target nucleic acids are DETECTED.  The SARS-CoV-2 RNA is generally detectable in upper respiratory specimens during the acute phase of infection. Positive results are indicative of the presence of the identified virus, but do not rule out bacterial infection or co-infection with other pathogens not detected by the test. Clinical correlation with patient history and other diagnostic information is necessary to determine patient infection status. The expected result is Negative.  Fact Sheet for Patients: EntrepreneurPulse.com.au  Fact Sheet for Healthcare Providers: IncredibleEmployment.be  This test is not yet approved or cleared by the [place] FDA and  has been authorized for detection and/or diagnosis of SARS-CoV-2 by FDA under an Emergency Use Authorization (EUA).  This EUA will remain in effect (meaning this test can b e used) for the  duration of  the COVID-19 declaration under Section 564(b)(1) of the Act, 21 U.S.C. section 360bbb-3(b)(1), unless the authorization is terminated or revoked sooner.     Influenza A by PCR NEGATIVE NEGATIVE Final   Influenza B by PCR NEGATIVE NEGATIVE Final    Comment: (NOTE) The Xpert Xpress SARS-CoV-2/FLU/RSV plus assay is intended as an aid in the diagnosis of influenza from Nasopharyngeal swab specimens and should not be used as a sole basis for treatment. Nasal washings and aspirates are unacceptable for Xpert Xpress SARS-CoV-2/FLU/RSV testing.  Fact Sheet for Patients: EntrepreneurPulse.com.au  Fact Sheet for Healthcare Providers: IncredibleEmployment.be  This test is not yet approved or cleared by the [place] FDA and has been authorized for detection and/or diagnosis of SARS-CoV-2 by FDA under an Emergency Use Authorization (EUA). This EUA will remain in effect (meaning this test  can be used) for the duration of the COVID-19 declaration under Section 564(b)(1) of the Act, 21 U.S.C. section 360bbb-3(b)(1), unless the authorization is terminated or revoked.  Performed at [facility], [address] [place], [place] [address]   MRSA Next Gen by PCR, Nasal     Status: None   Collection Time: 01/16/21  5:11 AM   Specimen: Nasal Mucosa; Nasal Swab  Result Value Ref Range Status   MRSA by PCR Next Gen NOT DETECTED NOT DETECTED Final    Comment: (NOTE) The GeneXpert MRSA Assay (FDA approved for NASAL specimens only), is one component of a comprehensive MRSA colonization surveillance program. It is not intended to diagnose MRSA infection nor to guide or monitor treatment for MRSA infections. Test performance is not FDA approved in patients less than 45 years old. Performed at [facility], [address] [place], [place] [address]   Culture, blood (routine x 2)     Status: None (Preliminary result)   Collection Time: 01/16/21  8:24 AM   Specimen: BLOOD RIGHT ARM  Result Value Ref Range Status   Specimen Description   Final    BLOOD RIGHT ARM BOTTLES DRAWN AEROBIC AND ANAEROBIC   Special Requests Blood Culture adequate volume  Final   Culture   Final    NO GROWTH 1 DAY Performed at [facility], [address] [place], [place] [address]    Report Status PENDING  Incomplete     Radiology Studies: CT HEAD WO CONTRAST (5MM)  Result Date: 01/16/2021 CLINICAL DATA:  Status post seizure. EXAM: CT HEAD WITHOUT CONTRAST TECHNIQUE: Contiguous axial images were obtained from the base of the skull through the vertex without intravenous contrast. COMPARISON:  June 03, 2020 FINDINGS: Brain: No evidence of acute infarction, hemorrhage, hydrocephalus, extra-axial collection or mass lesion/mass effect. Vascular: No hyperdense vessel or unexpected calcification. Skull: Normal. Negative for fracture or focal lesion. Sinuses/Orbits: A small right maxillary sinus air-fluid level  is seen. Other: None. IMPRESSION: 1. No acute intracranial abnormality. Electronically Signed   By: Virgina Norfolk M.D.   On: 01/16/2021 01:08   US RENAL  Result Date: 01/16/2021 CLINICAL DATA:  Acute kidney injury EXAM: RENAL / URINARY TRACT ULTRASOUND COMPLETE COMPARISON:  None. FINDINGS: Right Kidney: Renal measurements: 10.7 x 5.0 x 5.5 cm = volume: 156 mL. Echogenicity within normal limits. No mass or hydronephrosis visualized. Left Kidney: Renal measurements: 10.3 x 6.0 x 5.3 cm = volume: 172 mL. Echogenicity within normal limits. No mass or hydronephrosis visualized. Bladder: Appears normal for degree of bladder distention. Bilateral ureteral jets are not noted, and the patient is not able to void at the  time of the examination. Other: None. IMPRESSION: 1. Unremarkable sonographic appearance of the kidneys. No hydronephrosis. 2. Bilateral ureteral jets are not noted, and the patient is not able to void at the time of the examination. Correlate for urinary retention. Electronically Signed   By: Eddie Candle M.D.   On: 01/16/2021 13:04   US Carotid Bilateral  Result Date: 01/16/2021 CLINICAL DATA:  Altered mental status, seizures EXAM: BILATERAL CAROTID DUPLEX ULTRASOUND TECHNIQUE: Pearline Cables scale imaging, color Doppler and duplex ultrasound were performed of bilateral carotid and vertebral arteries in the neck. COMPARISON:  None. FINDINGS: Criteria: Quantification of carotid stenosis is based on velocity parameters that correlate the residual internal carotid diameter with NASCET-based stenosis levels, using the diameter of the distal internal carotid lumen as the denominator for stenosis measurement. The following velocity measurements were obtained: RIGHT ICA: 127/43 cm/sec CCA: 637/85 cm/sec SYSTOLIC ICA/CCA RATIO:  0.9 ECA:  118 cm/sec LEFT ICA: 145/41 cm/sec CCA: 885/02 cm/sec SYSTOLIC ICA/CCA RATIO:  1.1 ECA:  107 cm/sec RIGHT CAROTID ARTERY: No significant atherosclerotic plaque or evidence of  stenosis in the internal carotid artery. RIGHT VERTEBRAL ARTERY:  Patent with normal antegrade flow. LEFT CAROTID ARTERY: Trace smooth heterogeneous atherosclerotic plaque in the proximal internal carotid artery. By peak systolic velocity criteria in the region of plaque, the estimated stenosis is less than 50%. LEFT VERTEBRAL ARTERY:  Patent with normal antegrade flow. IMPRESSION: 1. Mild (1-49%) stenosis proximal left internal carotid artery secondary to trace smooth heterogeneous atherosclerotic plaque. 2. No significant atherosclerotic plaque or evidence of stenosis in the right internal carotid artery. 3. Vertebral arteries are patent with normal antegrade flow. Signed, Criselda Peaches, MD, [facility] Radiology Electronically Signed   By: Jacqulynn Cadet M.D.   On: 01/16/2021 11:00   DG CHEST PORT 1 VIEW  Result Date: 01/17/2021 CLINICAL DATA:  Severe sepsis.  Patient is COVID-19 positive. EXAM: PORTABLE CHEST 1 VIEW COMPARISON:  January 16, 2021 FINDINGS: Heart, hila, mediastinum are normal. No pneumothorax. No nodules or masses. Minimal haziness in the left costophrenic angle, likely atelectasis. No suspicious infiltrates are identified. IMPRESSION: Minimal haziness in the left costophrenic angle is favored to represent atelectasis. No other abnormalities are identified. No suspicious infiltrates. Electronically Signed   By: Dorise Bullion III M.D.   On: 01/17/2021 08:13   DG Chest Port 1 View  Result Date: 01/16/2021 CLINICAL DATA:  Seizure. EXAM: PORTABLE CHEST 1 VIEW COMPARISON:  May 09, 2016 FINDINGS: The heart size and mediastinal contours are within normal limits. Both lungs are clear. The visualized skeletal structures are unremarkable. IMPRESSION: No active disease. Electronically Signed   By: Virgina Norfolk M.D.   On: 01/16/2021 01:02   EEG adult  Result Date: 01/16/2021 Lora Havens, MD     01/16/2021 10:16 PM  Patient Name: [name] MRN: [id number] Epilepsy Attending: Lora Havens Referring Physician/Provider: Dr Clearence Ped, [place] B Date: 01/16/2021 Duration: 22.32 mins Patient history: 50 yo F with AMS and metabolic changes, Likely due to xanax withdrawal seizures. EEG to evaluate for seizure Level of alertness: Awake AEDs during EEG study: LEV, GBP Technical aspects: This EEG study was done with scalp electrodes positioned according to the 10-20 International system of electrode placement. Electrical activity was acquired at a sampling rate of 500Hz  and reviewed with a high frequency filter of 70Hz  and a low frequency filter of 1Hz . EEG data were recorded continuously and digitally stored. Description: The posterior dominant rhythm consists of 9  Hz activity of moderate voltage (25-35 uV) seen predominantly in posterior head regions, symmetric and reactive to eye opening and eye closing. EEG showed intermittent generalized 5 to 6 Hz theta slowing. Hyperventilation and photic stimulation were not performed.   ABNORMALITY - Intermittent slow, generalized IMPRESSION: This study is suggestive of mild diffuse encephalopathy, nonspecific etiology. No seizures or epileptiform discharges were seen throughout the recording. Lora Havens   ECHOCARDIOGRAM COMPLETE  Result Date: 01/16/2021    ECHOCARDIOGRAM REPORT   Patient Name:   [name] Date of Exam: 01/16/2021 Medical Rec #:  [id number]     Height:       66.0 in Accession #:    [id number]    Weight:       145.0 lb Date of Birth:  [birth year]     BSA:          1.744 m Patient Age:    13 years      BP:           81/20 mmHg Patient Gender: F             HR:           111 bpm. Exam Location:  [name] Procedure: 2D Echo, Cardiac Doppler and Color Doppler Indications:    Syncope  History:        Patient has prior history of Echocardiogram examinations, most                 recent 07/11/2010. COPD, Signs/Symptoms:Dyspnea; Risk                 Factors:Tobacco abuse  and Diabetes. COVID +.  Sonographer:    Wenda Low Referring Phys: ASIA B Duncan  1. Left ventricular ejection fraction, by estimation, is 70 to 75%. The left ventricle has hyperdynamic function. The left ventricle has no regional wall motion abnormalities. There is mild left ventricular hypertrophy. Left ventricular diastolic parameters are indeterminate.  2. Right ventricular systolic function is normal. The right ventricular size is normal. Tricuspid regurgitation signal is inadequate for assessing PA pressure.  3. No right atrial or right ventricular collapse to suggest tamponade physiology. Respiratory variation in mitral outflow not assessed. Presence of sinus tachycardia does raise concern however for potential hemodynamic significance. Follow-up clinically  and suggest repeat limited echocardiogram within tht next 48 hours. Moderate pericardial effusion. The pericardial effusion is circumferential.  4. The mitral valve is grossly normal. Mild mitral valve regurgitation.  5. The aortic valve is tricuspid. Aortic valve regurgitation is not visualized. No aortic stenosis is present. Aortic valve mean gradient measures 6.0 mmHg.  6. The inferior vena cava is normal in size with <50% respiratory variability, suggesting right atrial pressure of 8 mmHg. Comparison(s): No prior Echocardiogram. FINDINGS  Left Ventricle: Left ventricular ejection fraction, by estimation, is 70 to 75%. The left ventricle has hyperdynamic function. The left ventricle has no regional wall motion abnormalities. The left ventricular internal cavity size was normal in size. There is mild left ventricular hypertrophy. Left ventricular diastolic parameters are indeterminate. Right Ventricle: The right ventricular size is normal. No increase in right ventricular wall thickness. Right ventricular systolic function is normal. Tricuspid regurgitation signal is inadequate for assessing PA pressure. Left Atrium: Left atrial  size was normal in size. Right Atrium: Right atrial size was normal in size. Pericardium: No right atrial or right ventricular collapse to suggest tamponade physiology. Respiratory variation in mitral outflow not assessed. Presence of sinus tachycardia does raise concern  however for potential hemodynamic significance. Follow-up clinically and suggest repeat limited echocardiogram within tht next 48 hours. A moderately sized pericardial effusion is present. The pericardial effusion is circumferential. Mitral Valve: The mitral valve is grossly normal. Mild mitral valve regurgitation. MV peak gradient, 13.1 mmHg. The mean mitral valve gradient is 6.0 mmHg. Tricuspid Valve: The tricuspid valve is grossly normal. Tricuspid valve regurgitation is trivial. Aortic Valve: The aortic valve is tricuspid. Aortic valve regurgitation is not visualized. No aortic stenosis is present. Aortic valve mean gradient measures 6.0 mmHg. Aortic valve peak gradient measures 10.8 mmHg. Aortic valve area, by VTI measures 2.42  cm. Pulmonic Valve: The pulmonic valve was grossly normal. Pulmonic valve regurgitation is trivial. Aorta: The aortic root is normal in size and structure. Venous: The inferior vena cava is normal in size with less than 50% respiratory variability, suggesting right atrial pressure of 8 mmHg. IAS/Shunts: No atrial level shunt detected by color flow Doppler.  LEFT VENTRICLE PLAX 2D LVIDd:         3.70 cm  Diastology LVIDs:         2.40 cm  LV e' medial:    9.17 cm/s LV PW:         1.10 cm  LV E/e' medial:  11.2 LV IVS:        1.10 cm  LV e' lateral:   9.17 cm/s LVOT diam:     2.00 cm  LV E/e' lateral: 11.2 LV SV:         58 LV SV Index:   33 LVOT Area:     3.14 cm  RIGHT VENTRICLE RV Basal diam:  2.30 cm RV Mid diam:    2.00 cm RV S prime:     27.70 cm/s TAPSE (M-mode): 3.3 cm LEFT ATRIUM             Index       RIGHT ATRIUM           Index LA diam:        3.30 cm 1.89 cm/m  RA Area:     10.80 cm LA Vol (A2C):   41.8  ml 23.96 ml/m RA Volume:   17.70 ml  10.15 ml/m LA Vol (A4C):   34.7 ml 19.89 ml/m LA Biplane Vol: 38.5 ml 22.07 ml/m  AORTIC VALVE AV Area (Vmax):    2.47 cm AV Area (Vmean):   2.20 cm AV Area (VTI):     2.42 cm AV Vmax:           164.00 cm/s AV Vmean:          109.000 cm/s AV VTI:            0.240 m AV Peak Grad:      10.8 mmHg AV Mean Grad:      6.0 mmHg LVOT Vmax:         129.00 cm/s LVOT Vmean:        76.400 cm/s LVOT VTI:          0.185 m LVOT/AV VTI ratio: 0.77  AORTA Ao Root diam: 3.20 cm MITRAL VALVE MV Area (PHT): 5.02 cm     SHUNTS MV Area VTI:   1.73 cm     Systemic VTI:  0.18 m MV Peak grad:  13.1 mmHg    Systemic Diam: 2.00 cm MV Mean grad:  6.0 mmHg MV Vmax:       1.81 m/s MV Vmean:      109.0 cm/s MV  Decel Time: 151 msec MV E velocity: 103.00 cm/s MV A velocity: 168.00 cm/s MV E/A ratio:  0.61 Rozann Lesches MD Electronically signed by Rozann Lesches MD Signature Date/Time: 01/16/2021/4:08:15 PM    Final    [place] EKG SITE RITE  Result Date: 01/16/2021 If Site Rite image not attached, placement could not be confirmed due to current cardiac rhythm.   Scheduled Meds:  amitriptyline  75 mg Oral QHS   Chlorhexidine Gluconate Cloth  6 each Topical Daily   doxycycline  100 mg Oral Q12H   DULoxetine  30 mg Oral Daily   ezetimibe  10 mg Oral Daily   folic acid  1 mg Oral Daily   gabapentin  300 mg Oral TID   heparin  5,000 Units Subcutaneous Q8H   hydrOXYzine  25 mg Oral QHS   levETIRAcetam  750 mg Oral BID   LORazepam  0-4 mg Intravenous Q6H   Followed by   Derrill Memo ON 01/18/2021] LORazepam  0-4 mg Intravenous Q12H   multivitamin with minerals  1 tablet Oral Daily   nicotine  14 mg Transdermal Daily   pantoprazole  40 mg Oral Daily   sodium bicarbonate  650 mg Oral TID   thiamine  100 mg Oral Daily   Or   thiamine  100 mg Intravenous Daily   Continuous Infusions:  lactated ringers 200 mL/hr at 01/17/21 0118   metronidazole 500 mg (01/17/21 1109)     LOS: 1 day   Time  spent: 36 mins   Morrigan Wickens Wynetta Emery, MD How to contact the [facility] Attending or Consulting provider [place] or covering provider during after hours [place], for this patient?  Check the care team in [facility] and look for a) attending/consulting [facility] provider listed and b) the [facility] team listed Log into [email or web address] and use [facility] universal password to access. If you do not have the password, please contact the hospital operator. Locate the [facility] provider you are looking for under [facility] and page to a number that you can be directly reached. If you still have difficulty reaching the provider, please page the [facility] (Director on Call) for the Hospitalists listed on amion for assistance.  01/17/2021, 3:40 PM

## 2021-01-17 NOTE — Progress Notes (Signed)
Notified RN to contact CV[facility] if PICC still needed.

## 2021-01-17 NOTE — Progress Notes (Signed)
Date and time results received: 01/17/21 0820 (use smartphrase ".now" to insert current time)  Test: Lactic Acid Critical Value: 3.6  Name of Provider Notified:  Murlean Iba MD  Orders Received? Or Actions Taken?: See new orders

## 2021-01-17 NOTE — Progress Notes (Signed)
Labs reviewed remotely. Significant improvement in renal function with great urine output. In recovery phase. CK also improved. Can likely decrease IVF rate. Will sign off from a nephrology perspective. Please call with any questions/concerns. Discussed with primary service.  Gean Quint, MD [facility]

## 2021-01-17 NOTE — Progress Notes (Signed)
Date and time results received: 01/17/21 1311 (use smartphrase ".now" to insert current time)  Test: Lactic acid  Critical Value: 2.7  Name of Provider Notified: Murlean Iba, MD  Orders Received? Or Actions Taken?: see new orders

## 2021-01-18 DIAGNOSIS — K219 Gastro-esophageal reflux disease without esophagitis: Secondary | ICD-10-CM | POA: Diagnosis not present

## 2021-01-18 DIAGNOSIS — N179 Acute kidney failure, unspecified: Secondary | ICD-10-CM | POA: Diagnosis not present

## 2021-01-18 DIAGNOSIS — E119 Type 2 diabetes mellitus without complications: Secondary | ICD-10-CM | POA: Diagnosis not present

## 2021-01-18 DIAGNOSIS — U071 COVID-19: Secondary | ICD-10-CM | POA: Diagnosis not present

## 2021-01-18 LAB — CBC WITH DIFFERENTIAL/PLATELET
Abs Immature Granulocytes: 0.07 10*3/uL (ref 0.00–0.07)
Basophils Absolute: 0 10*3/uL (ref 0.0–0.1)
Basophils Relative: 0 %
Eosinophils Absolute: 0 10*3/uL (ref 0.0–0.5)
Eosinophils Relative: 0 %
HCT: 33.2 % — ABNORMAL LOW (ref 36.0–46.0)
Hemoglobin: 10.9 g/dL — ABNORMAL LOW (ref 12.0–15.0)
Immature Granulocytes: 1 %
Lymphocytes Relative: 10 %
Lymphs Abs: 1 10*3/uL (ref 0.7–4.0)
MCH: 30.9 pg (ref 26.0–34.0)
MCHC: 32.8 g/dL (ref 30.0–36.0)
MCV: 94.1 fL (ref 80.0–100.0)
Monocytes Absolute: 0.4 10*3/uL (ref 0.1–1.0)
Monocytes Relative: 5 %
Neutro Abs: 7.9 10*3/uL — ABNORMAL HIGH (ref 1.7–7.7)
Neutrophils Relative %: 84 %
Platelets: 222 10*3/uL (ref 150–400)
RBC: 3.53 MIL/uL — ABNORMAL LOW (ref 3.87–5.11)
RDW: 14.3 % (ref 11.5–15.5)
WBC: 9.5 10*3/uL (ref 4.0–10.5)
nRBC: 0 % (ref 0.0–0.2)

## 2021-01-18 LAB — COMPREHENSIVE METABOLIC PANEL
ALT: 23 U/L (ref 0–44)
AST: 42 U/L — ABNORMAL HIGH (ref 15–41)
Albumin: 2.1 g/dL — ABNORMAL LOW (ref 3.5–5.0)
Alkaline Phosphatase: 93 U/L (ref 38–126)
Anion gap: 5 (ref 5–15)
BUN: 10 mg/dL (ref 6–20)
CO2: 24 mmol/L (ref 22–32)
Calcium: 7.8 mg/dL — ABNORMAL LOW (ref 8.9–10.3)
Chloride: 105 mmol/L (ref 98–111)
Creatinine, Ser: 0.85 mg/dL (ref 0.44–1.00)
GFR, Estimated: >60 mL/min (ref >60)
Glucose, Bld: 103 mg/dL — ABNORMAL HIGH (ref 70–99)
Potassium: 3.7 mmol/L (ref 3.5–5.1)
Sodium: 134 mmol/L — ABNORMAL LOW (ref 135–145)
Total Bilirubin: 0.6 mg/dL (ref 0.3–1.2)
Total Protein: 4.4 g/dL — ABNORMAL LOW (ref 6.5–8.1)

## 2021-01-18 LAB — CK: Total CK: 313 U/L — ABNORMAL HIGH (ref 38–234)

## 2021-01-18 LAB — MAGNESIUM: Magnesium: 1.6 mg/dL — ABNORMAL LOW (ref 1.7–2.4)

## 2021-01-18 LAB — PROCALCITONIN: Procalcitonin: 2.61 ng/mL

## 2021-01-18 MED ORDER — LACTATED RINGERS IV SOLN: INTRAVENOUS | Status: DC

## 2021-01-18 MED ORDER — METRONIDAZOLE 500 MG PO TABS
500.0000 mg | ORAL_TABLET | Freq: Two times a day (BID) | ORAL | Status: AC
  Administered 2021-01-18 – 2021-01-19 (×4): 500 mg via ORAL
  Filled 2021-01-18 (×4): qty 1

## 2021-01-18 MED ORDER — CYCLOSPORINE 0.05 % OP EMUL
1.0000 [drp] | Freq: Two times a day (BID) | OPHTHALMIC | Status: DC
  Administered 2021-01-18 – 2021-01-24 (×12): 1 [drp] via OPHTHALMIC
  Filled 2021-01-18 (×3): qty 30
  Filled 2021-01-18: qty 1
  Filled 2021-01-18: qty 30
  Filled 2021-01-18 (×2): qty 1
  Filled 2021-01-18 (×4): qty 30

## 2021-01-18 MED ORDER — MAGNESIUM SULFATE 4 GM/100ML IV SOLN
4.0000 g | Freq: Once | INTRAVENOUS | Status: AC
  Administered 2021-01-18: 4 g via INTRAVENOUS
  Filled 2021-01-18: qty 100

## 2021-01-18 MED ORDER — TOPIRAMATE 25 MG PO TABS
25.0000 mg | ORAL_TABLET | Freq: Two times a day (BID) | ORAL | Status: DC
  Administered 2021-01-18 – 2021-01-24 (×13): 25 mg via ORAL
  Filled 2021-01-18 (×13): qty 1

## 2021-01-18 MED ORDER — POTASSIUM CHLORIDE CRYS ER 20 MEQ PO TBCR
20.0000 meq | EXTENDED_RELEASE_TABLET | Freq: Once | ORAL | Status: AC
  Administered 2021-01-18: 20 meq via ORAL
  Filled 2021-01-18: qty 1

## 2021-01-18 NOTE — Progress Notes (Signed)
PROGRESS NOTE   [name]  EXH:[id number] DOB: [birth year] DOA: 01/16/2021 PCP: Teodoro Kil, PA-C   Chief Complaint  Patient presents with   Seizures   Level of care: Telemetry  Brief Admission History:   50 y.o. female, with history of COPD, Corhn's, GERD, HTN, T2DM, and more presents to the ED with a chief complaint of seizure.  It is unclear how accurate patient's history is.  She reports that she was sitting on the toilet, attempting to void but could not start a stream, when she had a seizure.  When asked how she knew it was a seizure she reports it is because she has had seizures before.  Patient reports that she is supposed to be on seizure medication but she does not know the moderate the dose.  She reports that she remembers the whole thing.  There was no rhythmic jerking or twitching.  She reports she just knows it was a seizure.  Patient reports that she did not lose consciousness and remembers the whole thing.  Somehow, a "stranger" found her and brought her to the hospital per her report.  I discussed with the patient that her labs would indicate that she was laying on the ground for some time, and asked if she had lost time.  Patient reports that she hit the ground several times yesterday because she found out that her mother and father died at the same time in 2 different locations.  She was noted to have severe dehydration, rhabdomyolysis and AKI with severe sepsis of unknown source.    Assessment & Plan:   Principal Problem:   AKI (acute kidney injury) ([place]) Active Problems:   GERD (gastroesophageal reflux disease)   Tobacco abuse   Diabetes mellitus without complication (HCC)   Rhabdomyolysis   Lactic acidosis   COVID-19 virus infection   Severe sepsis (HCC)  Severe sepsis of unknown origin - RESOLVED  Presented with markedly elevated procalcitonin level and elevated lactic acid level was empirically started on IV antibiotics broad-spectrum.  As procalcitonin is  trending down we are de-escalating antibiotics. Continue supportive measures as ordered.  Follow-up lactic acid 2 improvement  Acute metabolic encephalopathy - RESOLVED  -Likely secondary to postictal state from withdrawal seizure -Improving with supportive measures -CT did not show any acute abnormalities. 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  DVT prophylaxis: SCD Code Status: Full Family Communication: Updated at bedside Disposition: Anticipate home Status is: Inpatient  Remains inpatient appropriate because:Inpatient level of care appropriate due to severity of illness  Dispo: The patient is from: Home              Anticipated d/c is to: Home              Patient currently is not medically stable to d/c.   Difficult to place patient No  Consultants:  nephrology  Procedures:  N/a  Antimicrobials:  Doxycycline metronidazole  Subjective: Pt reports feeling better today.     Objective: Vitals:   01/18/21 0505 01/18/21 0600 01/18/21 0800 01/18/21 1120  BP:  129/68 133/61 (!) 110/58  Pulse:    (!) 104  Resp:  19 (!) 23 16  Temp: 98.2  F (36.8 C)   98.6 F (37 C)  TempSrc: Oral   Oral  SpO2:    97%  Weight: 73.3 kg     Height:        Intake/Output Summary (Last 24 hours) at 01/18/2021 1515 Last data filed at 01/18/2021 1300 Gross per 24 hour  Intake 2525.06 ml  Output 2901 ml  Net -375.94 ml   Filed Weights   01/16/21 0000 01/17/21 0400 01/18/21 0505  Weight: 65.8 kg 74.7 kg  73.3 kg   Examination:  General exam: awake,  Appears calm and comfortable  Respiratory system: Clear to auscultation. Respiratory effort normal. Cardiovascular system: normal S1 & S2 heard. No JVD, murmurs, rubs, gallops or clicks. No pedal edema. Gastrointestinal system: Abdomen is nondistended, soft and nontender. No organomegaly or masses felt. Normal bowel sounds heard. Central nervous system: Alert and oriented. No focal neurological deficits. Extremities: Symmetric 5 x 5 power. Skin: No rashes, lesions or ulcers Psychiatry: Judgement and insight appear poor. Mood & affect appropriate.   Data Reviewed: I have personally reviewed following labs and imaging studies  CBC: Recent Labs  Lab 01/16/21 0041 01/17/21 0444 01/18/21 0308  WBC 13.5* 10.7* 9.5  NEUTROABS  --  8.8* 7.9*  HGB 13.9 12.6 10.9*  HCT 43.4 37.9 33.2*  MCV 95.8 92.9 94.1  PLT 319 277 378    Basic Metabolic Panel: Recent Labs  Lab 01/16/21 0041 01/16/21 0823 01/17/21 0444 01/18/21 0308  NA 133* 134* 134* 134*  K 3.4* 3.2* 4.6 3.7  CL 98 104 103 105  CO2 16* 16* 20* 24  GLUCOSE 138* 125* 118* 103*  BUN 42* 34* 15 10  CREATININE 5.36* 3.53* 1.27* 0.85  CALCIUM 8.1* 7.5* 8.5* 7.8*  MG  --   --  1.8 1.6*  PHOS  --  3.5  --   --     GFR: Estimated Creatinine Clearance: 80.2 mL/min (by C-G formula based on SCr of 0.85 mg/dL).  Liver Function Tests: Recent Labs  Lab 01/16/21 0041 01/16/21 0823 01/17/21 0444 01/18/21 0308  AST 79*  --  67* 42*  ALT 38  --  32 23  ALKPHOS 195*  --  142* 93  BILITOT 0.4  --  0.6 0.6  PROT 6.9  --  5.6* 4.4*  ALBUMIN 3.5 3.0* 2.7* 2.1*    CBG: No results for input(s): GLUCAP in the last 168 hours.  Recent Results (from the past 240 hour(s))  Resp Panel by RT-PCR (Flu A&B, Covid) Nasopharyngeal Swab     Status: Abnormal   Collection Time: 01/16/21  2:32 AM   Specimen: Nasopharyngeal Swab; Nasopharyngeal(NP) swabs in vial transport medium  Result Value Ref  Range Status   SARS Coronavirus 2 by RT PCR POSITIVE (A) NEGATIVE Final    Comment: RESULT CALLED TO, READ BACK BY AND VERIFIED WITH: HEARN,J@[id number]  by Matthews,b 9.16.22 (NOTE) SARS-CoV-2 target nucleic acids are DETECTED.  The SARS-CoV-2 RNA is generally detectable in upper respiratory specimens during the acute phase of infection. Positive results are indicative of the presence of the identified virus, but do not rule out bacterial infection or co-infection with other pathogens not detected by the test. Clinical correlation with patient history and other diagnostic information is necessary to determine patient infection status. The expected result is Negative.  Fact Sheet for Patients: EntrepreneurPulse.com.au  Fact Sheet for Healthcare Providers: IncredibleEmployment.be  This test is not yet approved or cleared by the [place] FDA and  has been authorized for detection  and/or diagnosis of SARS-CoV-2 by FDA under an Emergency Use Authorization (EUA).  This EUA will remain in effect (meaning this test can b e used) for the duration of  the COVID-19 declaration under Section 564(b)(1) of the Act, 21 U.S.C. section 360bbb-3(b)(1), unless the authorization is terminated or revoked sooner.     Influenza A by PCR NEGATIVE NEGATIVE Final   Influenza B by PCR NEGATIVE NEGATIVE Final    Comment: (NOTE) The Xpert Xpress SARS-CoV-2/FLU/RSV plus assay is intended as an aid in the diagnosis of influenza from Nasopharyngeal swab specimens and should not be used as a sole basis for treatment. Nasal washings and aspirates are unacceptable for Xpert Xpress SARS-CoV-2/FLU/RSV testing.  Fact Sheet for Patients: EntrepreneurPulse.com.au  Fact Sheet for Healthcare Providers: IncredibleEmployment.be  This test is not yet approved or cleared by the [place] FDA and has been authorized for detection and/or  diagnosis of SARS-CoV-2 by FDA under an Emergency Use Authorization (EUA). This EUA will remain in effect (meaning this test can be used) for the duration of the COVID-19 declaration under Section 564(b)(1) of the Act, 21 U.S.C. section 360bbb-3(b)(1), unless the authorization is terminated or revoked.  Performed at [facility], [address], [place], [place] [address]   MRSA Next Gen by PCR, Nasal     Status: None   Collection Time: 01/16/21  5:11 AM   Specimen: Nasal Mucosa; Nasal Swab  Result Value Ref Range Status   MRSA by PCR Next Gen NOT DETECTED NOT DETECTED Final    Comment: (NOTE) The GeneXpert MRSA Assay (FDA approved for NASAL specimens only), is one component of a comprehensive MRSA colonization surveillance program. It is not intended to diagnose MRSA infection nor to guide or monitor treatment for MRSA infections. Test performance is not FDA approved in patients less than 53 years old. Performed at [facility], [address], [place], [place] [address]   Culture, blood (routine x 2)     Status: None (Preliminary result)   Collection Time: 01/16/21  8:24 AM   Specimen: BLOOD RIGHT ARM  Result Value Ref Range Status   Specimen Description   Final    BLOOD RIGHT ARM BOTTLES DRAWN AEROBIC AND ANAEROBIC   Special Requests Blood Culture adequate volume  Final   Culture   Final    NO GROWTH 2 DAYS Performed at [facility], [address], [place], [place] [address]    Report Status PENDING  Incomplete     Radiology Studies: DG CHEST PORT 1 VIEW  Result Date: 01/17/2021 CLINICAL DATA:  Severe sepsis.  Patient is COVID-19 positive. EXAM: PORTABLE CHEST 1 VIEW COMPARISON:  January 16, 2021 FINDINGS: Heart, hila, mediastinum are normal. No pneumothorax. No nodules or masses. Minimal haziness in the left costophrenic angle, likely atelectasis. No suspicious infiltrates are identified. IMPRESSION: Minimal haziness in the left costophrenic angle is favored to represent  atelectasis. No other abnormalities are identified. No suspicious infiltrates. Electronically Signed   By: Dorise Bullion III M.D.   On: 01/17/2021 08:13   EEG adult  Result Date: 01/16/2021 Lora Havens, MD     01/16/2021 10:16 PM Patient Name: Janasha Barkalow MRN: [id number] Epilepsy Attending: Lora Havens Referring Physician/Provider: Dr Clearence Ped, [place] B Date: 01/16/2021 Duration: 22.32 mins Patient history: 50 yo F with AMS and metabolic changes, Likely due to xanax withdrawal seizures. EEG to evaluate for seizure Level of alertness: Awake AEDs during EEG study: LEV, GBP Technical aspects: This EEG study was done with scalp  electrodes positioned according to the 10-20 International system of electrode placement. Electrical activity was acquired at a sampling rate of 500Hz  and reviewed with a high frequency filter of 70Hz  and a low frequency filter of 1Hz . EEG data were recorded continuously and digitally stored. Description: The posterior dominant rhythm consists of 9 Hz activity of moderate voltage (25-35 uV) seen predominantly in posterior head regions, symmetric and reactive to eye opening and eye closing. EEG showed intermittent generalized 5 to 6 Hz theta slowing. Hyperventilation and photic stimulation were not performed.   ABNORMALITY - Intermittent slow, generalized IMPRESSION: This study is suggestive of mild diffuse encephalopathy, nonspecific etiology. No seizures or epileptiform discharges were seen throughout the recording. Priyanka [name]    Scheduled Meds:  amitriptyline  75 mg Oral QHS   Chlorhexidine Gluconate Cloth  6 each Topical Daily   cycloSPORINE  1 drop Both Eyes BID   doxycycline  100 mg Oral Q12H   DULoxetine  30 mg Oral Daily   ezetimibe  10 mg Oral Daily   folic acid  1 mg Oral Daily   gabapentin  300 mg Oral TID   heparin  5,000 Units Subcutaneous Q8H   hydrOXYzine  25 mg Oral QHS   levETIRAcetam  750 mg Oral BID   LORazepam  0-4 mg Intravenous Q12H    metroNIDAZOLE  500 mg Oral Q12H   multivitamin with minerals  1 tablet Oral Daily   nicotine  14 mg Transdermal Daily   pantoprazole  40 mg Oral Daily   thiamine  100 mg Oral Daily   Or   thiamine  100 mg Intravenous Daily   topiramate  25 mg Oral BID   Continuous Infusions:  lactated ringers 50 mL/hr at 01/18/21 0941     LOS: 2 days   Time spent: 35 mins   Colie Josten Wynetta Emery, MD How to contact the [facility] Attending or Consulting provider [place] or covering provider during after hours [place], for this patient?  Check the care team in [facility] and look for a) attending/consulting TR[facility] provider listed and b) the [facility] team listed Log into [email or web address] and use [place]'s universal password to access. If you do not have the password, please contact the hospital operator. Locate the [facility] provider you are looking for under [facility] and page to a number that you can be directly reached. If you still have difficulty reaching the provider, please page the [facility] (Director on Call) for the Hospitalists listed on amion for assistance.  01/18/2021, 3:15 PM

## 2021-01-18 NOTE — Plan of Care (Signed)
  Problem: Acute Rehab PT Goals(only PT should resolve) Goal: Pt Will Go Supine/Side To Sit Outcome: Progressing Flowsheets (Taken 01/18/2021 1339) Pt will go Supine/Side to Sit: with modified independence Goal: Patient Will Transfer Sit To/From Stand Outcome: Progressing Flowsheets (Taken 01/18/2021 1339) Patient will transfer sit to/from stand: with modified independence Goal: Pt Will Transfer Bed To Chair/Chair To Bed Outcome: Progressing Flowsheets (Taken 01/18/2021 1339) Pt will Transfer Bed to Chair/Chair to Bed: with modified independence Goal: Pt Will Ambulate Outcome: Progressing Flowsheets (Taken 01/18/2021 1339) Pt will Ambulate:  > 125 feet  with modified independence  with least restrictive assistive device   1:40 PM, 01/18/21 Lonell Grandchild, MPT Physical Therapist with [facility] [phone] office [phone] mobile phone

## 2021-01-18 NOTE — Progress Notes (Signed)
Walked with standby to shower chair and bathed self with minimal assist.  Has voided since foley removal.  No appetite and refused lunch and supper.  Significant other at bedside all day.  No evidence of seizure

## 2021-01-18 NOTE — Progress Notes (Signed)
By wheelchair from ICU.  Ambulated in room to bathroom and sink and brushed teeth with significant other standing by.  Alert and oriented x 3 and somewhat slow to respond verbally.  Vitals stable.. Removed foley.

## 2021-01-18 NOTE — Evaluation (Signed)
Physical Therapy Evaluation Patient Details Name: [name] MRN: [id number] DOB: [birth year] Today's Date: 01/18/2021  History of Present Illness  [name]  is a 50 y.o. female, with history of COPD, Corhn's, GERD, HTN, T2DM, and more presents to the ED with a chief complaint of seizure.  It is unclear how accurate patient's history is.  She reports that she was sitting on the toilet, attempting to void but could not start a stream, when she had a seizure.  When asked how she knew it was a seizure she reports it is because she has had seizures before.  Patient reports that she is supposed to be on seizure medication but she does not know the moderate the dose.  She reports that she remembers the whole thing.  There was no rhythmic jerking or twitching.  She reports she just knows it was a seizure.  Patient reports that she did not lose consciousness and remembers the whole thing.  Somehow, a "stranger" found her and brought her to the hospital per her report.  I discussed with the patient that her labs would indicate that she was laying on the ground for some time, and asked if she had lost time.  Patient reports that she hit the ground several times yesterday because she found out that her mother and father died at the same time in 2 different locations.  Again the accuracy of this history is unclear.   Clinical Impression  Patient demonstrates slow labored movement for sitting up at bedside requiring Min assist and repeated verbal cues mostly due poor carryover for following instructions initially.  Once seated patient became more alert and followed all instructions appropriately, able to transfer to commode to have a bowel movement and later walked in room without AD occasionally having to lean on nearby objects for support without loss of balance.  Patient tolerated sitting up in chair after therapy with her spouse present in room.  Patient will benefit from continued physical therapy in hospital  and recommended venue below to increase strength, balance, endurance for safe ADLs and gait.          Recommendations for follow up therapy are one component of a multi-disciplinary discharge planning process, led by the attending physician.  Recommendations may be updated based on patient status, additional functional criteria and insurance authorization.  Follow Up Recommendations Home health PT;Supervision for mobility/OOB;Supervision - Intermittent    Equipment Recommendations  None recommended by PT    Recommendations for Other Services       Precautions / Restrictions Precautions Precautions: Fall Restrictions Weight Bearing Restrictions: No      Mobility  Bed Mobility Overal bed mobility: Needs Assistance Bed Mobility: Supine to Sit     Supine to sit: Min assist     General bed mobility comments: increased time, required repeated verbal/tactile cueing    Transfers Overall transfer level: Needs assistance Equipment used: 1 person hand held assist;None Transfers: Sit to/from [facility] Sit to Stand: Min guard Stand pivot transfers: Min guard       General transfer comment: labored movement wiht increased time  Ambulation/Gait Ambulation/Gait assistance: Min guard Gait Distance (Feet): 40 Feet Assistive device: None;1 person hand held assist Gait Pattern/deviations: Decreased step length - right;Decreased step length - left;Decreased stride length Gait velocity: decreased   General Gait Details: slow labored cadence without occasional leaning on nearby objects for support without loss of balance, limited secondary to fatigue  Stairs  Wheelchair Mobility    Modified Rankin (Stroke Patients Only)       Balance Overall balance assessment: Needs assistance Sitting-balance support: Feet supported;No upper extremity supported Sitting balance-Leahy Scale: Good Sitting balance - Comments: seated at EOB   Standing  balance support: During functional activity;No upper extremity supported Standing balance-Leahy Scale: Fair Standing balance comment: fair without AD                             Pertinent Vitals/Pain Pain Assessment: No/denies pain    Home Living Family/patient expects to be discharged to:: Private residence Living Arrangements: Spouse/significant other Available Help at Discharge: Family;Available 24 hours/day Type of Home: House Home Access: Stairs to enter Entrance Stairs-Rails: None Entrance Stairs-Number of Steps: 5 Home Layout: One level Home Equipment: Cane - single point      Prior Function Level of Independence: Independent with assistive device(s)         Comments: Hydrographic surveyor, drives     Journalist, newspaper        Extremity/Trunk Assessment   Upper Extremity Assessment Upper Extremity Assessment: Overall WFL for tasks assessed    Lower Extremity Assessment Lower Extremity Assessment: Generalized weakness    Cervical / Trunk Assessment Cervical / Trunk Assessment: Normal  Communication   Communication: No difficulties  Cognition Arousal/Alertness: Awake/alert Behavior During Therapy: WFL for tasks assessed/performed Overall Cognitive Status: Within Functional Limits for tasks assessed                                        General Comments      Exercises     Assessment/Plan    PT Assessment Patient needs continued PT services  PT Problem List Decreased strength;Decreased activity tolerance;Decreased balance;Decreased mobility       PT Treatment Interventions DME instruction;Gait training;Stair training;Functional mobility training;Therapeutic activities;Therapeutic exercise;Patient/family education;Balance training    PT Goals (Current goals can be found in the Care Plan section)  Acute Rehab PT Goals Patient Stated Goal: return home with family to assist PT Goal Formulation: With patient/family Time For  Goal Achievement: 01/21/21 Potential to Achieve Goals: Good    Frequency Min 3X/week   Barriers to discharge        Co-evaluation               AM-PAC PT "6 Clicks" Mobility  Outcome Measure Help needed turning from your back to your side while in a flat bed without using bedrails?: A Little Help needed moving from lying on your back to sitting on the side of a flat bed without using bedrails?: A Lot Help needed moving to and from a bed to a chair (including a wheelchair)?: A Little Help needed standing up from a chair using your arms (e.g., wheelchair or bedside chair)?: A Little Help needed to walk in hospital room?: A Little Help needed climbing 3-5 steps with a railing? : A Little 6 Click Score: 17    End of Session   Activity Tolerance: Patient tolerated treatment well;Patient limited by fatigue Patient left: in chair;with call bell/phone within reach;with family/visitor present Nurse Communication: Mobility status PT Visit Diagnosis: Unsteadiness on feet (R26.81);Other abnormalities of gait and mobility (R26.89);Muscle weakness (generalized) (M62.81)    Time: 1001-1027 PT Time Calculation (min) (ACUTE ONLY): 26 min   Charges:   PT Evaluation $PT Eval Moderate Complexity: 1 Mod PT  Treatments $Therapeutic Activity: 23-37 mins        1:38 PM, 01/18/21 Lonell Grandchild, MPT Physical Therapist with [facility] [phone] office [phone] mobile phone

## 2021-01-19 ENCOUNTER — Inpatient Hospital Stay (HOSPITAL_COMMUNITY): Payer: 59

## 2021-01-19 DIAGNOSIS — N179 Acute kidney failure, unspecified: Secondary | ICD-10-CM | POA: Diagnosis not present

## 2021-01-19 DIAGNOSIS — K219 Gastro-esophageal reflux disease without esophagitis: Secondary | ICD-10-CM | POA: Diagnosis not present

## 2021-01-19 DIAGNOSIS — E119 Type 2 diabetes mellitus without complications: Secondary | ICD-10-CM | POA: Diagnosis not present

## 2021-01-19 DIAGNOSIS — M79671 Pain in right foot: Secondary | ICD-10-CM

## 2021-01-19 DIAGNOSIS — U071 COVID-19: Secondary | ICD-10-CM | POA: Diagnosis not present

## 2021-01-19 DIAGNOSIS — I313 Pericardial effusion (noninflammatory): Secondary | ICD-10-CM | POA: Diagnosis present

## 2021-01-19 DIAGNOSIS — I3139 Other pericardial effusion (noninflammatory): Secondary | ICD-10-CM | POA: Diagnosis present

## 2021-01-19 LAB — BASIC METABOLIC PANEL
Anion gap: 6 (ref 5–15)
BUN: 9 mg/dL (ref 6–20)
CO2: 26 mmol/L (ref 22–32)
Calcium: 8.2 mg/dL — ABNORMAL LOW (ref 8.9–10.3)
Chloride: 101 mmol/L (ref 98–111)
Creatinine, Ser: 0.96 mg/dL (ref 0.44–1.00)
GFR, Estimated: >60 mL/min (ref >60)
Glucose, Bld: 96 mg/dL (ref 70–99)
Potassium: 3.3 mmol/L — ABNORMAL LOW (ref 3.5–5.1)
Sodium: 133 mmol/L — ABNORMAL LOW (ref 135–145)

## 2021-01-19 LAB — ECHOCARDIOGRAM LIMITED
Height: 66 in
S' Lateral: 2.4 cm
Weight: 2585.55 oz

## 2021-01-19 LAB — MAGNESIUM: Magnesium: 2 mg/dL (ref 1.7–2.4)

## 2021-01-19 MED ORDER — POTASSIUM CHLORIDE CRYS ER 20 MEQ PO TBCR
40.0000 meq | EXTENDED_RELEASE_TABLET | Freq: Once | ORAL | Status: AC
  Administered 2021-01-19: 40 meq via ORAL
  Filled 2021-01-19: qty 2

## 2021-01-19 NOTE — Progress Notes (Signed)
*  PRELIMINARY RESULTS* Echocardiogram 2D Echocardiogram has been performed.  [name] 01/19/2021, 10:30 AM

## 2021-01-19 NOTE — TOC Initial Note (Signed)
Transition of Care [facility]) - Initial/Assessment Note    Patient Details  Name: [name] MRN: [id number] Date of Birth: [birth year]  Transition of Care [facility]) CM/SW Contact:    Ihor Gully, LCSW Phone Number: 01/19/2021, 4:09 PM  Clinical Narrative:                 Patient is from home with significant other. Agreeable to [facility] referral. Declines SA resources.   Expected Discharge Plan: [place] Barriers to Discharge: Continued Medical Work up   Patient Goals and CMS Choice Patient states their goals for this hospitalization and ongoing recovery are:: home with [facility]      Expected Discharge Plan and Services Expected Discharge Plan: [place]       Living arrangements for the past 2 months: Single Family Home                                      Prior Living Arrangements/Services Living arrangements for the past 2 months: Single Family Home Lives with:: Significant Other Patient language and need for interpreter reviewed:: Yes        Need for Family Participation in Patient Care: Yes (Comment) Care giver support system in place?: Yes (comment)   Criminal Activity/Legal Involvement Pertinent to Current Situation/Hospitalization: No - Comment as needed  Activities of Daily Living Home Assistive Devices/Equipment: None ADL Screening (condition at time of admission) Patient's cognitive ability adequate to safely complete daily activities?: Yes Is the patient deaf or have difficulty hearing?: No Does the patient have difficulty seeing, even when wearing glasses/contacts?: No Does the patient have difficulty concentrating, remembering, or making decisions?: No Patient able to express need for assistance with ADLs?: Yes Does the patient have difficulty dressing or bathing?: No Independently performs ADLs?: Yes (appropriate for developmental age) Does the patient have difficulty walking or climbing stairs?: No Weakness of Legs:  None Weakness of Arms/Hands: None  Permission Sought/Granted                  Emotional Assessment     Affect (typically observed): Appropriate Orientation: : Oriented to Self, Oriented to Place, Oriented to  Time, Oriented to Situation Alcohol / Substance Use: Other (comment) (THC positive) Psych Involvement: No (comment)  Admission diagnosis:  AKI (acute kidney injury) ([place]) [N17.9] Severe sepsis ([place]) [A41.9, R65.20] Patient Active Problem List   Diagnosis Date Noted   Moderate Pericardial effusion 01/19/2021   AKI (acute kidney injury) ([place]) 01/16/2021   Rhabdomyolysis 01/16/2021   Lactic acidosis 01/16/2021   COVID-19 virus infection 01/16/2021   Severe sepsis ([place]) 01/16/2021   Chest pain at rest 05/09/2016   COPD (chronic obstructive pulmonary disease) ([place])    Crohn's disease ([place])    Diabetes mellitus without complication ([place])    Anxiety    Tobacco abuse    PTSD (post-traumatic stress disorder)    Palpitation    Unspecified constipation 09/20/2012   Dyspnea 01/26/2012   Paresthesia 11/25/2011   Abdominal pain, chronic, epigastric 11/01/2011   Abdominal pain, acute, periumbilical 05/11/3233   GERD (gastroesophageal reflux disease)    Hyperglycemia    Chronic low back pain    IBS (irritable bowel syndrome)    Closed head injury    TOBACCO ABUSE 07/13/2010   Palpitations 07/13/2010   Pleuritic chest pain 07/13/2010   Anxiety state, unspecified 07/03/2008   BACK PAIN, LUMBAR, CHRONIC  07/03/2008   PCP:  Teodoro Kil, PA-C Pharmacy:   [facility], [place] [address] Phone: [phone] Fax: [place], [place] [address] Phone: [phone] Fax: [phone]  [place] [address], [place] [address] Phone: [phone] Fax: [phone]  [place] [facility] [address], [place]  - [place] [address] [place] [address] Phone: [phone] Fax: [phone]     Social Determinants of Health (SDOH) Interventions    Readmission Risk Interventions No flowsheet data found.

## 2021-01-19 NOTE — Progress Notes (Signed)
PROGRESS NOTE   [name]  RSW:[id number] DOB: Mar 28, 1971 DOA: 01/16/2021 PCP: Teodoro Kil, PA-C   Chief Complaint  Patient presents with   Seizures   Level of care: Telemetry  Brief Admission History:   50 y.o. female, with history of COPD, Corhn's, GERD, HTN, T2DM, and more presents to the ED with a chief complaint of seizure.  It is unclear how accurate patient's history is.  She reports that she was sitting on the toilet, attempting to void but could not start a stream, when she had a seizure.  When asked how she knew it was a seizure she reports it is because she has had seizures before.  Patient reports that she is supposed to be on seizure medication but she does not know the moderate the dose.  She reports that she remembers the whole thing.  There was no rhythmic jerking or twitching.  She reports she just knows it was a seizure.  Patient reports that she did not lose consciousness and remembers the whole thing.  Somehow, a "stranger" found her and brought her to the hospital per her report.  I discussed with the patient that her labs would indicate that she was laying on the ground for some time, and asked if she had lost time.  Patient reports that she hit the ground several times yesterday because she found out that her mother and father died at the same time in 2 different locations.  She was noted to have severe dehydration, rhabdomyolysis and AKI with severe sepsis of unknown source.    Assessment & Plan:   Principal Problem:   AKI (acute kidney injury) ([place]) Active Problems:   GERD (gastroesophageal reflux disease)   Tobacco abuse   Diabetes mellitus without complication (HCC)   Rhabdomyolysis   Lactic acidosis   COVID-19 virus infection   Severe sepsis (HCC)   Moderate Pericardial effusion  Severe sepsis of unknown origin - RESOLVED  Presented with markedly elevated procalcitonin level and elevated lactic acid level was empirically started on IV antibiotics  broad-spectrum.  As procalcitonin is trending down we are de-escalating antibiotics. Continue supportive measures as ordered.  DC metronidazole today.    Acute metabolic encephalopathy - RESOLVED  -Likely secondary to postictal state from withdrawal seizure -Improving with supportive measures -CT did not show any acute abnormalities. 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  Moderate pericardial effusion  - repeat Echo done 9/19 slightly larger effusion noted - Dr. Harl Bowie recommended repeating limited Echo in 72 hours - discussed with patient/family-hold discharge today, follow up with cardiology  Right foot pain  - soft tissue injury only, xray images with no finding of fracture  DVT prophylaxis: SCD Code Status: Full Family Communication: significant other updated at bedside 9/19 Disposition: Anticipate home Status is: Inpatient  Remains inpatient appropriate because:Inpatient level of care appropriate due to severity of illness  Dispo: The patient is from: Home              Anticipated d/c is to: Home              Patient currently is not medically stable to d/c.   Difficult to place patient No  Consultants:  nephrology  Procedures:  N/a  Antimicrobials:  Doxycycline metronidazole  Subjective: Pt c/o right foot pain, she denies shortness of breath and chest pain     Objective: Vitals:   01/19/21 0031 01/19/21 0447 01/19/21 0753 01/19/21 1233  BP: 100/68 (!) 85/56 99/68 (!) 106/46   Pulse: 92 89 96 95  Resp: 18 18 20 20   Temp: 98.3 F (36.8 C) 97.7 F (36.5 C) 98.3 F (36.8 C) 99.3 F (37.4 C)  TempSrc:  Oral Oral Oral  SpO2: 94% 96% 95% 91%  Weight:      Height:        Intake/Output Summary (Last 24 hours) at 01/19/2021 1657 Last data filed at 01/19/2021 0900 Gross per 24 hour  Intake 795 ml  Output --  Net 795 ml   Filed Weights   01/16/21 0000 01/17/21 0400 01/18/21 0505  Weight: 65.8 kg 74.7 kg 73.3 kg   Examination:  General exam: awake,  Appears calm and comfortable  Respiratory system: Clear to auscultation. Respiratory effort normal. Cardiovascular system: normal S1 & S2 heard. No JVD, murmurs, rubs, gallops or clicks. No pedal edema. Gastrointestinal system: Abdomen is nondistended, soft and nontender. No organomegaly or masses felt. Normal bowel sounds heard. Central nervous system: Alert and oriented. No focal neurological deficits. Extremities: Symmetric 5 x 5 power. Skin: No rashes, lesions or ulcers Psychiatry: Judgement and insight appear poor. Mood & affect appropriate.   Data Reviewed: I have personally reviewed following labs and imaging studies  CBC: Recent Labs  Lab 01/16/21 0041 01/17/21 0444 01/18/21 0308  WBC 13.5* 10.7* 9.5  NEUTROABS  --  8.8* 7.9*  HGB 13.9 12.6 10.9*  HCT 43.4 37.9 33.2*  MCV 95.8 92.9 94.1  PLT 319 277 390    Basic Metabolic Panel: Recent Labs  Lab 01/16/21 0041 01/16/21 0823 01/17/21 0444 01/18/21 0308 01/19/21 0620  NA 133* 134* 134* 134* 133*  K 3.4* 3.2* 4.6 3.7 3.3*  CL 98 104 103 105 101  CO2 16* 16* 20* 24 26  GLUCOSE 138* 125* 118* 103* 96  BUN 42* 34* 15 10 9   CREATININE 5.36* 3.53* 1.27* 0.85 0.96  CALCIUM 8.1* 7.5* 8.5* 7.8* 8.2*  MG  --   --  1.8 1.6* 2.0  PHOS  --  3.5  --   --   --     GFR: Estimated Creatinine Clearance: 71 mL/min (by C-G formula based on SCr of 0.96 mg/dL).  Liver Function Tests: Recent Labs  Lab 01/16/21 0041 01/16/21 0823 01/17/21 0444  01/18/21 0308  AST 79*  --  67* 42*  ALT 38  --  32 23  ALKPHOS 195*  --  142* 93  BILITOT 0.4  --  0.6 0.6  PROT 6.9  --  5.6* 4.4*  ALBUMIN 3.5 3.0* 2.7* 2.1*    CBG: No results for input(s): GLUCAP in the last 168 hours.  Recent Results (from the past 240 hour(s))  Resp Panel by RT-PCR (Flu A&B, Covid) Nasopharyngeal Swab     Status: Abnormal   Collection Time: 01/16/21  2:32 AM   Specimen: Nasopharyngeal Swab; Nasopharyngeal(NP) swabs in vial transport medium  Result Value Ref Range Status   SARS Coronavirus 2 by RT PCR POSITIVE (A) NEGATIVE Final    Comment: RESULT CALLED TO, READ BACK BY AND VERIFIED WITH: HEARN,J@[id number]  by Matthews,b 9.16.22 (NOTE) SARS-CoV-2 target nucleic acids are DETECTED.  The SARS-CoV-2 RNA is generally detectable in upper respiratory specimens during the acute phase of  infection. Positive results are indicative of the presence of the identified virus, but do not rule out bacterial infection or co-infection with other pathogens not detected by the test. Clinical correlation with patient history and other diagnostic information is necessary to determine patient infection status. The expected result is Negative.  Fact Sheet for Patients: EntrepreneurPulse.com.au  Fact Sheet for Healthcare Providers: IncredibleEmployment.be  This test is not yet approved or cleared by the [place] FDA and  has been authorized for detection and/or diagnosis of SARS-CoV-2 by FDA under an Emergency Use Authorization (EUA).  This EUA will remain in effect (meaning this test can b e used) for the duration of  the COVID-19 declaration under Section 564(b)(1) of the Act, 21 U.S.C. section 360bbb-3(b)(1), unless the authorization is terminated or revoked sooner.     Influenza A by PCR NEGATIVE NEGATIVE Final   Influenza B by PCR NEGATIVE NEGATIVE Final    Comment: (NOTE) The Xpert Xpress SARS-CoV-2/FLU/RSV plus assay is  intended as an aid in the diagnosis of influenza from Nasopharyngeal swab specimens and should not be used as a sole basis for treatment. Nasal washings and aspirates are unacceptable for Xpert Xpress SARS-CoV-2/FLU/RSV testing.  Fact Sheet for Patients: EntrepreneurPulse.com.au  Fact Sheet for Healthcare Providers: IncredibleEmployment.be  This test is not yet approved or cleared by the [place] FDA and has been authorized for detection and/or diagnosis of SARS-CoV-2 by FDA under an Emergency Use Authorization (EUA). This EUA will remain in effect (meaning this test can be used) for the duration of the COVID-19 declaration under Section 564(b)(1) of the Act, 21 U.S.C. section 360bbb-3(b)(1), unless the authorization is terminated or revoked.  Performed at [facility], [address], [place], [place] [address]   MRSA Next Gen by PCR, Nasal     Status: None   Collection Time: 01/16/21  5:11 AM   Specimen: Nasal Mucosa; Nasal Swab  Result Value Ref Range Status   MRSA by PCR Next Gen NOT DETECTED NOT DETECTED Final    Comment: (NOTE) The GeneXpert MRSA Assay (FDA approved for NASAL specimens only), is one component of a comprehensive MRSA colonization surveillance program. It is not intended to diagnose MRSA infection nor to guide or monitor treatment for MRSA infections. Test performance is not FDA approved in patients less than 4 years old. Performed at [facility], [address], [place], [place] [address]   Culture, blood (routine x 2)     Status: None (Preliminary result)   Collection Time: 01/16/21  8:24 AM   Specimen: BLOOD RIGHT ARM  Result Value Ref Range Status   Specimen Description   Final    BLOOD RIGHT ARM BOTTLES DRAWN AEROBIC AND ANAEROBIC   Special Requests Blood Culture adequate volume  Final   Culture   Final    NO GROWTH 3 DAYS Performed at [facility], [address] [place], [place] [address]    Report  Status PENDING  Incomplete     Radiology Studies: DG Foot 2 Views Right  Result Date: 01/19/2021 CLINICAL DATA:  Recent fall with bruising EXAM: RIGHT FOOT - 2 VIEW COMPARISON:  None. FINDINGS: There is no evidence of fracture or dislocation. There is no evidence of arthropathy or other focal bone abnormality. Soft tissues are unremarkable. IMPRESSION: Negative. Electronically Signed   By: Jorje Guild M.D.   On: 01/19/2021 11:14   ECHOCARDIOGRAM LIMITED  Result Date: 01/19/2021    ECHOCARDIOGRAM LIMITED REPORT   Patient Name:   [name] Date  of Exam: 01/19/2021 Medical Rec #:  [id number]     Height:       66.0 in Accession #:    [id number]    Weight:       161.6 lb Date of Birth:  02-23-1971     BSA:          1.827 m Patient Age:    58 years      BP:           99/68 mmHg Patient Gender: F             HR:           96 bpm. Exam Location:  [name] Procedure: Limited Echo Indications:    Pericardial effusion  History:        Patient has prior history of Echocardiogram examinations, most                 recent 01/16/2021. COPD, Arrythmias:PVC, Signs/Symptoms:Chest                 Pain; Risk Factors:Diabetes. COVID +, Tobacco Abuse, Sepsis,                 Pericardial effusion.  Sonographer:    Wenda Low Referring Phys: [place]  1. Left ventricular ejection fraction, by estimation, is 60 to 65%. The left ventricle has normal function.  2. Moderate pericardial effusion. The pericardial effusion is circumferential. There is no evidence of cardiac tamponade. Effusion looks larger compared to the 01/16/2021 study however remains overall moderate by measurements. Recommend repeating limited  study in 72 hours. FINDINGS  Left Ventricle: Left ventricular ejection fraction, by estimation, is 60 to 65%. The left ventricle has normal function. Pericardium: A moderately sized pericardial effusion is present. The pericardial effusion is circumferential. There is no evidence of  cardiac tamponade. LEFT VENTRICLE PLAX 2D LVIDd:         3.90 cm LVIDs:         2.40 cm LV PW:         1.00 cm LV IVS:        1.00 cm LVOT diam:     2.00 cm LVOT Area:     3.14 cm  LEFT ATRIUM         Index LA diam:    3.40 cm 1.86 cm/m   SHUNTS Systemic Diam: 2.00 cm Carlyle Dolly MD Electronically signed by Carlyle Dolly MD Signature Date/Time: 01/19/2021/11:20:45 AM    Final     Scheduled Meds:  amitriptyline  75 mg Oral QHS   Chlorhexidine Gluconate Cloth  6 each Topical Daily   cycloSPORINE  1 drop Both Eyes BID   doxycycline  100 mg Oral Q12H   DULoxetine  30 mg Oral Daily   ezetimibe  10 mg Oral Daily   folic acid  1 mg Oral Daily   gabapentin  300 mg Oral TID   heparin  5,000 Units Subcutaneous Q8H   hydrOXYzine  25 mg Oral QHS   levETIRAcetam  750 mg Oral BID   LORazepam  0-4 mg Intravenous Q12H   metroNIDAZOLE  500 mg Oral Q12H   multivitamin with minerals  1 tablet Oral Daily   nicotine  14 mg Transdermal Daily   pantoprazole  40 mg Oral Daily   thiamine  100 mg Oral Daily   Or   thiamine  100 mg Intravenous Daily   topiramate  25 mg Oral BID   Continuous Infusions:  lactated ringers 10 mL/hr at 01/19/21 1604    LOS: 3 days   Time spent: 35 mins   Adrea Sherpa Wynetta Emery, MD How to contact the [facility] Attending or Consulting provider [place] or covering provider during after hours [place], for this patient?  Check the care team in [name] and look for a) attending/consulting TRH provider listed and b) the [facility] team listed Log into [email or web address] and use [place]'s universal password to access. If you do not have the password, please contact the hospital operator. Locate the [facility] provider you are looking for under [facility] and page to a number that you can be directly reached. If you still have difficulty reaching the provider, please page the [facility] (Director on Call) for the Hospitalists listed on amion for assistance.  01/19/2021, 4:57 PM

## 2021-01-19 NOTE — Progress Notes (Signed)
Physical Therapy Treatment Patient Details Name: [name] MRN: [id number] DOB: 1971-03-25 Today's Date: 01/19/2021   History of Present Illness [name]  is a 50 y.o. female, with history of COPD, Corhn's, GERD, HTN, T2DM, and more presents to the ED with a chief complaint of seizure.  It is unclear how accurate patient's history is.  She reports that she was sitting on the toilet, attempting to void but could not start a stream, when she had a seizure.  When asked how she knew it was a seizure she reports it is because she has had seizures before.  Patient reports that she is supposed to be on seizure medication but she does not know the moderate the dose.  She reports that she remembers the whole thing.  There was no rhythmic jerking or twitching.  She reports she just knows it was a seizure.  Patient reports that she did not lose consciousness and remembers the whole thing.  Somehow, a "stranger" found her and brought her to the hospital per her report.  I discussed with the patient that her labs would indicate that she was laying on the ground for some time, and asked if she had lost time.  Patient reports that she hit the ground several times yesterday because she found out that her mother and father died at the same time in 2 different locations.  Again the accuracy of this history is unclear.    PT Comments    Patient demonstrates slow labored movement for sitting up at bedside with improvement for propping up on elbows to hands with verbal cueing, good return for transferring to/from commode in bathroom without loss of balance.  Patient slightly unsteady ambulating in room with occasional leaning on nearby objects for support antalgic gait on RLE due to bruise plantar surface of foot, no loss of balance and limited mostly due to c/o fatigue.  Patient tolerated sitting up in chair after therapy - nursing staff notified.  Patient will benefit from continued physical therapy in hospital and  recommended venue below to increase strength, balance, endurance for safe ADLs and gait.     Recommendations for follow up therapy are one component of a multi-disciplinary discharge planning process, led by the attending physician.  Recommendations may be updated based on patient status, additional functional criteria and insurance authorization.  Follow Up Recommendations  Home health PT;Supervision for mobility/OOB;Supervision - Intermittent     Equipment Recommendations  None recommended by PT    Recommendations for Other Services       Precautions / Restrictions Precautions Precautions: Fall Restrictions Weight Bearing Restrictions: No     Mobility  Bed Mobility Overal bed mobility: Needs Assistance Bed Mobility: Supine to Sit     Supine to sit: Supervision     General bed mobility comments: requires verbal cues for propping up on elbows to hands with fair/good carryover, but requires increased time    Transfers Overall transfer level: Needs assistance Equipment used: None Transfers: Sit to/from Stand;Stand Pivot Transfers Sit to Stand: Supervision Stand pivot transfers: Supervision       General transfer comment: slow labored movement  Ambulation/Gait Ambulation/Gait assistance: Supervision Gait Distance (Feet): 60 Feet Assistive device: None Gait Pattern/deviations: Decreased step length - right;Decreased step length - left;Decreased stance time - right;Decreased stride length;Antalgic Gait velocity: decreased   General Gait Details: slow labored cadence with limited weightbearing on RLE due to plantar surface bruise, occasional leaning on nearby objects for support, no loss of balance, limited secondary  to c/o fatigue   Stairs             Wheelchair Mobility    Modified Rankin (Stroke Patients Only)       Balance Overall balance assessment: Needs assistance Sitting-balance support: Feet supported;No upper extremity supported Sitting  balance-Leahy Scale: Good Sitting balance - Comments: seated at EOB   Standing balance support: During functional activity;No upper extremity supported Standing balance-Leahy Scale: Fair Standing balance comment: fair without AD                            Cognition Arousal/Alertness: Awake/alert Behavior During Therapy: WFL for tasks assessed/performed Overall Cognitive Status: Within Functional Limits for tasks assessed                                        Exercises General Exercises - Lower Extremity Long Arc Quad: Seated;AROM;Strengthening;Both;10 reps Hip Flexion/Marching: Seated;AROM;Strengthening;Both;10 reps Toe Raises: Seated;AROM;Strengthening;Both;15 reps Heel Raises: Seated;AROM;Strengthening;Both;15 reps    General Comments        Pertinent Vitals/Pain Pain Assessment: Faces Faces Pain Scale: Hurts little more Pain Location: right foot plantar surface with bruise Pain Descriptors / Indicators: Sore;Guarding Pain Intervention(s): Limited activity within patient's tolerance;Monitored during session;Repositioned    Home Living                      Prior Function            PT Goals (current goals can now be found in the care plan section) Acute Rehab PT Goals Patient Stated Goal: return home with family to assist PT Goal Formulation: With patient/family Time For Goal Achievement: 01/21/21 Potential to Achieve Goals: Good Progress towards PT goals: Progressing toward goals    Frequency    Min 3X/week      PT Plan Current plan remains appropriate    Co-evaluation              AM-PAC PT "6 Clicks" Mobility   Outcome Measure  Help needed turning from your back to your side while in a flat bed without using bedrails?: None Help needed moving from lying on your back to sitting on the side of a flat bed without using bedrails?: A Little Help needed moving to and from a bed to a chair (including a  wheelchair)?: A Little Help needed standing up from a chair using your arms (e.g., wheelchair or bedside chair)?: A Little Help needed to walk in hospital room?: A Little Help needed climbing 3-5 steps with a railing? : A Little 6 Click Score: 19    End of Session   Activity Tolerance: Patient tolerated treatment well;Patient limited by fatigue Patient left: in chair;with call bell/phone within reach Nurse Communication: Mobility status PT Visit Diagnosis: Unsteadiness on feet (R26.81);Other abnormalities of gait and mobility (R26.89);Muscle weakness (generalized) (M62.81)     Time: 9518-8416 PT Time Calculation (min) (ACUTE ONLY): 25 min  Charges:  $Gait Training: 8-22 mins $Therapeutic Exercise: 8-22 mins                     3:26 PM, 01/19/21 Lonell Grandchild, MPT Physical Therapist with [facility] [phone] office [phone] mobile phone

## 2021-01-20 DIAGNOSIS — E872 Acidosis: Secondary | ICD-10-CM | POA: Diagnosis not present

## 2021-01-20 DIAGNOSIS — I313 Pericardial effusion (noninflammatory): Secondary | ICD-10-CM

## 2021-01-20 DIAGNOSIS — U071 COVID-19: Secondary | ICD-10-CM | POA: Diagnosis not present

## 2021-01-20 DIAGNOSIS — N179 Acute kidney failure, unspecified: Secondary | ICD-10-CM | POA: Diagnosis not present

## 2021-01-20 DIAGNOSIS — G9341 Metabolic encephalopathy: Secondary | ICD-10-CM

## 2021-01-20 NOTE — Progress Notes (Addendum)
PROGRESS NOTE  [name] OLM:[id number] DOB: 12-13-1970 DOA: 01/16/2021 PCP: Teodoro Kil, PA-C  Brief History:   50 y.o. female, with history of COPD, Corhn's, GERD, HTN, T2DM, and more presents to the ED with a chief complaint of seizure.  It is unclear how accurate patient's history is.  She reports that she was sitting on the toilet, attempting to void but could not start a stream, when she had a seizure.  When asked how she knew it was a seizure she reports it is because she has had seizures before.  Patient reports that she is supposed to be on seizure medication but she does not know the moderate the dose.  She reports that she remembers the whole thing.  There was no rhythmic jerking or twitching.  She reports she just knows it was a seizure.  Patient reports that she did not lose consciousness and remembers the whole thing.  Somehow, a "stranger" found her and brought her to the hospital per her report.  I discussed with the patient that her labs would indicate that she was laying on the ground for some time, and asked if she had lost time.  Patient reports that she hit the ground several times yesterday because she found out that her mother and father died at the same time in 2 different locations.  She was noted to have severe dehydration, rhabdomyolysis and AKI with SIRS of unknown source.      Assessment/Plan: SIRS -sepsis ruled out -presented with leukocytosis, tachycardia, AMS  -Presented with markedly elevated procalcitonin level and elevated lactic acid level was empirically started on IV antibiotics broad-spectrum.   -As procalcitonin is trending down we are de-escalating antibiotics. -d/c all antibiotics and observe -blood cultures neg -CXR--neg for consolidation -UA no pyuria   Acute metabolic encephalopathy - -Likely secondary to postictal state from withdrawal seizure and AKI --overall improving with supportive measures -Improving with supportive  measures -CT did not show any acute abnormalities. -EEG unrevealing epileptiform activity noted -d/c Elavil and Atarax -UDS-->positive THC -01/16/21 VBG--no hypercarbia -check B12 -folate -TSH -ammonia -9/16 UA--no pyuria  Xanax withdrawal seizure -appreciate neurology consult -PDMP reviewed--pt receives xanax 1 mg, #90 monthly -restarted xanax -she apparently ran out 3-4 days prior to admission and significant other found her lethargic on floor   Lactic acidosis -improving with supportive measures -lactate peaked 4.8 -due to delayed clearance -sepsis ruled out   AKI -serum creatinine peaked 5.36 -baseline creatinine 0.8-0.9 -improved with IVF -Appreciate nephrology consultation and recommendations   COVID infection -currently asymptomatic-following with supportive measures -stable on RA   Moderate pericardial effusion  - repeat Echo done 9/19 slightly larger effusion noted - Dr. Harl Bowie recommended repeating limited Echo in 72 hours   Right foot pain  - soft tissue injury only, xray images with no finding of fracture   Hypomagnesemia -replete     Status is: Inpatient  Remains inpatient appropriate because:Inpatient level of care appropriate due to severity of illness  Dispo: The patient is from: Home              Anticipated d/c is to: Home              Patient currently is not medically stable to d/c.   Difficult to place patient No        Family Communication:   no Family at bedside  Consultants:  renal, neurology  Code Status:  FULL   DVT Prophylaxis:  [place] Heparin    Procedures: As Listed in Progress Note Above  Antibiotics: None        Subjective: Patient denies fevers, chills, headache, chest pain, dyspnea, nausea, vomiting, diarrhea, abdominal pain, dysuria, hematuria,    Objective: Vitals:   01/19/21 1233 01/19/21 2101 01/20/21 0418 01/20/21 1301  BP: (!) 106/46 113/63 (!) 129/57 131/75  Pulse: 95 (!) 110 (!) 105 (!) 101   Resp: 20 19 20 18   Temp: 99.3 F (37.4 C) 99.3 F (37.4 C) 98 F (36.7 C) 99.1 F (37.3 C)  TempSrc: Oral Oral  Oral  SpO2: 91% 92% 93% 91%  Weight:      Height:        Intake/Output Summary (Last 24 hours) at 01/20/2021 1418 Last data filed at 01/20/2021 1209 Gross per 24 hour  Intake 713 ml  Output --  Net 713 ml   Weight change:  Exam:  General:  Pt is alert, follows commands appropriately, not in acute distress HEENT: No icterus, No thrush, No neck mass, [place]/AT Cardiovascular: RRR, S1/S2, no rubs, no gallops Respiratory: bibasilar crackles. No wheeze Abdomen: Soft/+BS, non tender, non distended, no guarding Extremities: No edema, No lymphangitis, No petechiae, No rashes, no synovitis Neuro:  CN II-XII intact, strength 4/5 in RUE, RLE, strength 4/5 LUE, LLE; sensation intact bilateral; no dysmetria; babinski equivocal    Data Reviewed: I have personally reviewed following labs and imaging studies Basic Metabolic Panel: Recent Labs  Lab 01/16/21 0041 01/16/21 0823 01/17/21 0444 01/18/21 0308 01/19/21 0620  NA 133* 134* 134* 134* 133*  K 3.4* 3.2* 4.6 3.7 3.3*  CL 98 104 103 105 101  CO2 16* 16* 20* 24 26  GLUCOSE 138* 125* 118* 103* 96  BUN 42* 34* 15 10 9   CREATININE 5.36* 3.53* 1.27* 0.85 0.96  CALCIUM 8.1* 7.5* 8.5* 7.8* 8.2*  MG  --   --  1.8 1.6* 2.0  PHOS  --  3.5  --   --   --    Liver Function Tests: Recent Labs  Lab 01/16/21 0041 01/16/21 0823 01/17/21 0444 01/18/21 0308  AST 79*  --  67* 42*  ALT 38  --  32 23  ALKPHOS 195*  --  142* 93  BILITOT 0.4  --  0.6 0.6  PROT 6.9  --  5.6* 4.4*  ALBUMIN 3.5 3.0* 2.7* 2.1*   No results for input(s): LIPASE, AMYLASE in the last 168 hours. No results for input(s): AMMONIA in the last 168 hours. Coagulation Profile: No results for input(s): INR, PROTIME in the last 168 hours. CBC: Recent Labs  Lab 01/16/21 0041 01/17/21 0444 01/18/21 0308  WBC 13.5* 10.7* 9.5  NEUTROABS  --  8.8* 7.9*  HGB  13.9 12.6 10.9*  HCT 43.4 37.9 33.2*  MCV 95.8 92.9 94.1  PLT 319 277 222   Cardiac Enzymes: Recent Labs  Lab 01/16/21 0041 01/17/21 0444 01/18/21 0308  CKTOTAL 2,980* 1,451* 313*   BNP: Invalid input(s): POCBNP CBG: No results for input(s): GLUCAP in the last 168 hours. HbA1C: No results for input(s): HGBA1C in the last 72 hours. Urine analysis:    Component Value Date/Time   COLORURINE YELLOW 01/16/2021 0256   APPEARANCEUR HAZY (A) 01/16/2021 0256   LABSPEC 1.008 01/16/2021 0256   PHURINE 6.0 01/16/2021 0256   GLUCOSEU NEGATIVE 01/16/2021 0256   GLUCOSEU NEG mg/dL 12/24/2009 2317   HGBUR LARGE (A) 01/16/2021 0256   BILIRUBINUR NEGATIVE 01/16/2021 [place] 01/16/2021 0256  PROTEINUR 30 (A) 01/16/2021 0256   UROBILINOGEN 0.2 12/26/2013 1530   NITRITE NEGATIVE 01/16/2021 0256   LEUKOCYTESUR NEGATIVE 01/16/2021 0256   Sepsis Labs: @LABRCNTIP (procalcitonin:4,lacticidven:4) ) Recent Results (from the past 240 hour(s))  Resp Panel by RT-PCR (Flu A&B, Covid) Nasopharyngeal Swab     Status: Abnormal   Collection Time: 01/16/21  2:32 AM   Specimen: Nasopharyngeal Swab; Nasopharyngeal(NP) swabs in vial transport medium  Result Value Ref Range Status   SARS Coronavirus 2 by RT PCR POSITIVE (A) NEGATIVE Final    Comment: RESULT CALLED TO, READ BACK BY AND VERIFIED WITH: HEARN,J@[id number]  by Matthews,b 9.16.22 (NOTE) SARS-CoV-2 target nucleic acids are DETECTED.  The SARS-CoV-2 RNA is generally detectable in upper respiratory specimens during the acute phase of infection. Positive results are indicative of the presence of the identified virus, but do not rule out bacterial infection or co-infection with other pathogens not detected by the test. Clinical correlation with patient history and other diagnostic information is necessary to determine patient infection status. The expected result is Negative.  Fact Sheet for  Patients: EntrepreneurPulse.com.au  Fact Sheet for Healthcare Providers: IncredibleEmployment.be  This test is not yet approved or cleared by the [place] FDA and  has been authorized for detection and/or diagnosis of SARS-CoV-2 by FDA under an Emergency Use Authorization (EUA).  This EUA will remain in effect (meaning this test can b e used) for the duration of  the COVID-19 declaration under Section 564(b)(1) of the Act, 21 U.S.C. section 360bbb-3(b)(1), unless the authorization is terminated or revoked sooner.     Influenza A by PCR NEGATIVE NEGATIVE Final   Influenza B by PCR NEGATIVE NEGATIVE Final    Comment: (NOTE) The Xpert Xpress SARS-CoV-2/FLU/RSV plus assay is intended as an aid in the diagnosis of influenza from Nasopharyngeal swab specimens and should not be used as a sole basis for treatment. Nasal washings and aspirates are unacceptable for Xpert Xpress SARS-CoV-2/FLU/RSV testing.  Fact Sheet for Patients: EntrepreneurPulse.com.au  Fact Sheet for Healthcare Providers: IncredibleEmployment.be  This test is not yet approved or cleared by the [place] FDA and has been authorized for detection and/or diagnosis of SARS-CoV-2 by FDA under an Emergency Use Authorization (EUA). This EUA will remain in effect (meaning this test can be used) for the duration of the COVID-19 declaration under Section 564(b)(1) of the Act, 21 U.S.C. section 360bbb-3(b)(1), unless the authorization is terminated or revoked.  Performed at [facility], [address] [place], [place] [address]   MRSA Next Gen by PCR, Nasal     Status: None   Collection Time: 01/16/21  5:11 AM   Specimen: Nasal Mucosa; Nasal Swab  Result Value Ref Range Status   MRSA by PCR Next Gen NOT DETECTED NOT DETECTED Final    Comment: (NOTE) The GeneXpert MRSA Assay (FDA approved for NASAL specimens only), is one component of a  comprehensive MRSA colonization surveillance program. It is not intended to diagnose MRSA infection nor to guide or monitor treatment for MRSA infections. Test performance is not FDA approved in patients less than 36 years old. Performed at [facility], [address], [place], [place] [address]   Culture, blood (routine x 2)     Status: None (Preliminary result)   Collection Time: 01/16/21  8:24 AM   Specimen: BLOOD RIGHT ARM  Result Value Ref Range Status   Specimen Description   Final    BLOOD RIGHT ARM BOTTLES DRAWN AEROBIC AND ANAEROBIC   Special Requests Blood Culture adequate  volume  Final   Culture   Final    NO GROWTH 4 DAYS Performed at [facility], [address] [place],  [address]    Report Status PENDING  Incomplete     Scheduled Meds:  Chlorhexidine Gluconate Cloth  6 each Topical Daily   cycloSPORINE  1 drop Both Eyes BID   DULoxetine  30 mg Oral Daily   ezetimibe  10 mg Oral Daily   folic acid  1 mg Oral Daily   gabapentin  300 mg Oral TID   heparin  5,000 Units Subcutaneous Q8H   levETIRAcetam  750 mg Oral BID   multivitamin with minerals  1 tablet Oral Daily   nicotine  14 mg Transdermal Daily   pantoprazole  40 mg Oral Daily   thiamine  100 mg Oral Daily   Or   thiamine  100 mg Intravenous Daily   topiramate  25 mg Oral BID   Continuous Infusions:  lactated ringers 10 mL/hr at 01/19/21 1604    Procedures/Studies: CT HEAD WO CONTRAST (5MM)  Result Date: 01/16/2021 CLINICAL DATA:  Status post seizure. EXAM: CT HEAD WITHOUT CONTRAST TECHNIQUE: Contiguous axial images were obtained from the base of the skull through the vertex without intravenous contrast. COMPARISON:  June 03, 2020 FINDINGS: Brain: No evidence of acute infarction, hemorrhage, hydrocephalus, extra-axial collection or mass lesion/mass effect. Vascular: No hyperdense vessel or unexpected calcification. Skull: Normal. Negative for fracture or focal lesion. Sinuses/Orbits: A small  right maxillary sinus air-fluid level is seen. Other: None. IMPRESSION: 1. No acute intracranial abnormality. Electronically Signed   By: Virgina Norfolk M.D.   On: 01/16/2021 01:08   US RENAL  Result Date: 01/16/2021 CLINICAL DATA:  Acute kidney injury EXAM: RENAL / URINARY TRACT ULTRASOUND COMPLETE COMPARISON:  None. FINDINGS: Right Kidney: Renal measurements: 10.7 x 5.0 x 5.5 cm = volume: 156 mL. Echogenicity within normal limits. No mass or hydronephrosis visualized. Left Kidney: Renal measurements: 10.3 x 6.0 x 5.3 cm = volume: 172 mL. Echogenicity within normal limits. No mass or hydronephrosis visualized. Bladder: Appears normal for degree of bladder distention. Bilateral ureteral jets are not noted, and the patient is not able to void at the time of the examination. Other: None. IMPRESSION: 1. Unremarkable sonographic appearance of the kidneys. No hydronephrosis. 2. Bilateral ureteral jets are not noted, and the patient is not able to void at the time of the examination. Correlate for urinary retention. Electronically Signed   By: Eddie Candle M.D.   On: 01/16/2021 13:04   US Carotid Bilateral  Result Date: 01/16/2021 CLINICAL DATA:  Altered mental status, seizures EXAM: BILATERAL CAROTID DUPLEX ULTRASOUND TECHNIQUE: Pearline Cables scale imaging, color Doppler and duplex ultrasound were performed of bilateral carotid and vertebral arteries in the neck. COMPARISON:  None. FINDINGS: Criteria: Quantification of carotid stenosis is based on velocity parameters that correlate the residual internal carotid diameter with NASCET-based stenosis levels, using the diameter of the distal internal carotid lumen as the denominator for stenosis measurement. The following velocity measurements were obtained: RIGHT ICA: 127/43 cm/sec CCA: 856/31 cm/sec SYSTOLIC ICA/CCA RATIO:  0.9 ECA:  118 cm/sec LEFT ICA: 145/41 cm/sec CCA: 497/02 cm/sec SYSTOLIC ICA/CCA RATIO:  1.1 ECA:  107 cm/sec RIGHT CAROTID ARTERY: No significant  atherosclerotic plaque or evidence of stenosis in the internal carotid artery. RIGHT VERTEBRAL ARTERY:  Patent with normal antegrade flow. LEFT CAROTID ARTERY: Trace smooth heterogeneous atherosclerotic plaque in the proximal internal carotid artery. By peak systolic velocity criteria in the region  of plaque, the estimated stenosis is less than 50%. LEFT VERTEBRAL ARTERY:  Patent with normal antegrade flow. IMPRESSION: 1. Mild (1-49%) stenosis proximal left internal carotid artery secondary to trace smooth heterogeneous atherosclerotic plaque. 2. No significant atherosclerotic plaque or evidence of stenosis in the right internal carotid artery. 3. Vertebral arteries are patent with normal antegrade flow. Signed, Criselda Peaches, MD, [facility] Electronically Signed   By: Jacqulynn Cadet M.D.   On: 01/16/2021 11:00   DG CHEST PORT 1 VIEW  Result Date: 01/17/2021 CLINICAL DATA:  Severe sepsis.  Patient is COVID-19 positive. EXAM: PORTABLE CHEST 1 VIEW COMPARISON:  January 16, 2021 FINDINGS: Heart, hila, mediastinum are normal. No pneumothorax. No nodules or masses. Minimal haziness in the left costophrenic angle, likely atelectasis. No suspicious infiltrates are identified. IMPRESSION: Minimal haziness in the left costophrenic angle is favored to represent atelectasis. No other abnormalities are identified. No suspicious infiltrates. Electronically Signed   By: Dorise Bullion III M.D.   On: 01/17/2021 08:13   DG Chest Port 1 View  Result Date: 01/16/2021 CLINICAL DATA:  Seizure. EXAM: PORTABLE CHEST 1 VIEW COMPARISON:  May 09, 2016 FINDINGS: The heart size and mediastinal contours are within normal limits. Both lungs are clear. The visualized skeletal structures are unremarkable. IMPRESSION: No active disease. Electronically Signed   By: Virgina Norfolk M.D.   On: 01/16/2021 01:02   DG Foot 2 Views Right  Result Date:  01/19/2021 CLINICAL DATA:  Recent fall with bruising EXAM: RIGHT FOOT - 2 VIEW COMPARISON:  None. FINDINGS: There is no evidence of fracture or dislocation. There is no evidence of arthropathy or other focal bone abnormality. Soft tissues are unremarkable. IMPRESSION: Negative. Electronically Signed   By: Jorje Guild M.D.   On: 01/19/2021 11:14   EEG adult  Result Date: 01/16/2021 Lora Havens, MD     01/16/2021 10:16 PM Patient Name: [name] MRN: [id number] Epilepsy Attending: Lora Havens Referring Physician/Provider: Dr Clearence Ped, [place] B Date: 01/16/2021 Duration: 22.32 mins Patient history: 50 yo F with AMS and metabolic changes, Likely due to xanax withdrawal seizures. EEG to evaluate for seizure Level of alertness: Awake AEDs during EEG study: LEV, GBP Technical aspects: This EEG study was done with scalp electrodes positioned according to the 10-20 International system of electrode placement. Electrical activity was acquired at a sampling rate of 500Hz  and reviewed with a high frequency filter of 70Hz  and a low frequency filter of 1Hz . EEG data were recorded continuously and digitally stored. Description: The posterior dominant rhythm consists of 9 Hz activity of moderate voltage (25-35 uV) seen predominantly in posterior head regions, symmetric and reactive to eye opening and eye closing. EEG showed intermittent generalized 5 to 6 Hz theta slowing. Hyperventilation and photic stimulation were not performed.   ABNORMALITY - Intermittent slow, generalized IMPRESSION: This study is suggestive of mild diffuse encephalopathy, nonspecific etiology. No seizures or epileptiform discharges were seen throughout the recording. Lora Havens   ECHOCARDIOGRAM COMPLETE  Result Date: 01/16/2021    ECHOCARDIOGRAM REPORT   Patient Name:   [name] Date of Exam: 01/16/2021 Medical Rec #:  [id number]     Height:       66.0 in Accession #:    [id number]    Weight:       145.0 lb Date of Birth:   1971/02/03     BSA:          1.744 m Patient Age:  49 years      BP:           81/20 mmHg Patient Gender: F             HR:           111 bpm. Exam Location:  [name] Procedure: 2D Echo, Cardiac Doppler and Color Doppler Indications:    Syncope  History:        Patient has prior history of Echocardiogram examinations, most                 recent 07/11/2010. COPD, Signs/Symptoms:Dyspnea; Risk                 Factors:Tobacco abuse and Diabetes. COVID +.  Sonographer:    Wenda Low Referring Phys: [place]  1. Left ventricular ejection fraction, by estimation, is 70 to 75%. The left ventricle has hyperdynamic function. The left ventricle has no regional wall motion abnormalities. There is mild left ventricular hypertrophy. Left ventricular diastolic parameters are indeterminate.  2. Right ventricular systolic function is normal. The right ventricular size is normal. Tricuspid regurgitation signal is inadequate for assessing PA pressure.  3. No right atrial or right ventricular collapse to suggest tamponade physiology. Respiratory variation in mitral outflow not assessed. Presence of sinus tachycardia does raise concern however for potential hemodynamic significance. Follow-up clinically  and suggest repeat limited echocardiogram within tht next 48 hours. Moderate pericardial effusion. The pericardial effusion is circumferential.  4. The mitral valve is grossly normal. Mild mitral valve regurgitation.  5. The aortic valve is tricuspid. Aortic valve regurgitation is not visualized. No aortic stenosis is present. Aortic valve mean gradient measures 6.0 mmHg.  6. The inferior vena cava is normal in size with <50% respiratory variability, suggesting right atrial pressure of 8 mmHg. Comparison(s): No prior Echocardiogram. FINDINGS  Left Ventricle: Left ventricular ejection fraction, by estimation, is 70 to 75%. The left ventricle has hyperdynamic function. The left ventricle has no regional  wall motion abnormalities. The left ventricular internal cavity size was normal in size. There is mild left ventricular hypertrophy. Left ventricular diastolic parameters are indeterminate. Right Ventricle: The right ventricular size is normal. No increase in right ventricular wall thickness. Right ventricular systolic function is normal. Tricuspid regurgitation signal is inadequate for assessing PA pressure. Left Atrium: Left atrial size was normal in size. Right Atrium: Right atrial size was normal in size. Pericardium: No right atrial or right ventricular collapse to suggest tamponade physiology. Respiratory variation in mitral outflow not assessed. Presence of sinus tachycardia does raise concern however for potential hemodynamic significance. Follow-up clinically and suggest repeat limited echocardiogram within tht next 48 hours. A moderately sized pericardial effusion is present. The pericardial effusion is circumferential. Mitral Valve: The mitral valve is grossly normal. Mild mitral valve regurgitation. MV peak gradient, 13.1 mmHg. The mean mitral valve gradient is 6.0 mmHg. Tricuspid Valve: The tricuspid valve is grossly normal. Tricuspid valve regurgitation is trivial. Aortic Valve: The aortic valve is tricuspid. Aortic valve regurgitation is not visualized. No aortic stenosis is present. Aortic valve mean gradient measures 6.0 mmHg. Aortic valve peak gradient measures 10.8 mmHg. Aortic valve area, by VTI measures 2.42  cm. Pulmonic Valve: The pulmonic valve was grossly normal. Pulmonic valve regurgitation is trivial. Aorta: The aortic root is normal in size and structure. Venous: The inferior vena cava is normal in size with less than 50% respiratory variability, suggesting right atrial pressure of 8 mmHg. IAS/Shunts: No atrial level shunt  detected by color flow Doppler.  LEFT VENTRICLE PLAX 2D LVIDd:         3.70 cm  Diastology LVIDs:         2.40 cm  LV e' medial:    9.17 cm/s LV PW:         1.10 cm   LV E/e' medial:  11.2 LV IVS:        1.10 cm  LV e' lateral:   9.17 cm/s LVOT diam:     2.00 cm  LV E/e' lateral: 11.2 LV SV:         58 LV SV Index:   33 LVOT Area:     3.14 cm  RIGHT VENTRICLE RV Basal diam:  2.30 cm RV Mid diam:    2.00 cm RV S prime:     27.70 cm/s TAPSE (M-mode): 3.3 cm LEFT ATRIUM             Index       RIGHT ATRIUM           Index LA diam:        3.30 cm 1.89 cm/m  RA Area:     10.80 cm LA Vol (A2C):   41.8 ml 23.96 ml/m RA Volume:   17.70 ml  10.15 ml/m LA Vol (A4C):   34.7 ml 19.89 ml/m LA Biplane Vol: 38.5 ml 22.07 ml/m  AORTIC VALVE AV Area (Vmax):    2.47 cm AV Area (Vmean):   2.20 cm AV Area (VTI):     2.42 cm AV Vmax:           164.00 cm/s AV Vmean:          109.000 cm/s AV VTI:            0.240 m AV Peak Grad:      10.8 mmHg AV Mean Grad:      6.0 mmHg LVOT Vmax:         129.00 cm/s LVOT Vmean:        76.400 cm/s LVOT VTI:          0.185 m LVOT/AV VTI ratio: 0.77  AORTA Ao Root diam: 3.20 cm MITRAL VALVE MV Area (PHT): 5.02 cm     SHUNTS MV Area VTI:   1.73 cm     Systemic VTI:  0.18 m MV Peak grad:  13.1 mmHg    Systemic Diam: 2.00 cm MV Mean grad:  6.0 mmHg MV Vmax:       1.81 m/s MV Vmean:      109.0 cm/s MV Decel Time: 151 msec MV E velocity: 103.00 cm/s MV A velocity: 168.00 cm/s MV E/A ratio:  0.61 Rozann Lesches MD Electronically signed by Rozann Lesches MD Signature Date/Time: 01/16/2021/4:08:15 PM    Final    ECHOCARDIOGRAM LIMITED  Result Date: 01/19/2021    ECHOCARDIOGRAM LIMITED REPORT   Patient Name:   [name] Date of Exam: 01/19/2021 Medical Rec #:  [id number]     Height:       66.0 in Accession #:    [id number]    Weight:       161.6 lb Date of Birth:  09-18-1970     BSA:          1.827 m Patient Age:    1 years      BP:           99/68 mmHg Patient Gender: F  HR:           96 bpm. Exam Location:  [name] Procedure: Limited Echo Indications:    Pericardial effusion  History:        Patient has prior history of Echocardiogram  examinations, most                 recent 01/16/2021. COPD, Arrythmias:PVC, Signs/Symptoms:Chest                 Pain; Risk Factors:Diabetes. COVID +, Tobacco Abuse, Sepsis,                 Pericardial effusion.  Sonographer:    Wenda Low Referring Phys: [place]  1. Left ventricular ejection fraction, by estimation, is 60 to 65%. The left ventricle has normal function.  2. Moderate pericardial effusion. The pericardial effusion is circumferential. There is no evidence of cardiac tamponade. Effusion looks larger compared to the 01/16/2021 study however remains overall moderate by measurements. Recommend repeating limited  study in 72 hours. FINDINGS  Left Ventricle: Left ventricular ejection fraction, by estimation, is 60 to 65%. The left ventricle has normal function. Pericardium: A moderately sized pericardial effusion is present. The pericardial effusion is circumferential. There is no evidence of cardiac tamponade. LEFT VENTRICLE PLAX 2D LVIDd:         3.90 cm LVIDs:         2.40 cm LV PW:         1.00 cm LV IVS:        1.00 cm LVOT diam:     2.00 cm LVOT Area:     3.14 cm  LEFT ATRIUM         Index LA diam:    3.40 cm 1.86 cm/m   SHUNTS Systemic Diam: 2.00 cm Carlyle Dolly MD Electronically signed by Carlyle Dolly MD Signature Date/Time: 01/19/2021/11:20:45 AM    Final    [place] EKG SITE RITE  Result Date: 01/16/2021 If Site Rite image not attached, placement could not be confirmed due to current cardiac rhythm.   Orson Eva, DO  [facility]  If 7PM-7AM, please contact night-coverage [email or web address] Password [email or web address] 01/20/2021, 2:18 PM   LOS: 4 days

## 2021-01-20 NOTE — Plan of Care (Signed)

## 2021-01-20 NOTE — Progress Notes (Addendum)
In to pt room for am rounds, pt sitting on side of bed, holding her IV angiocath in her hand. Pt states she, "didn't pull IV out, just loosened the tape." Advised pt that IV is out and she stated, "Oh, guess I did pull it out." Pt's with arms out of her gown, tele leads off. Bed linens in floor. Pt speaking in whispering voice, denies c/o. Asking for ice and mountain dew. Significant other in recliner asleep. NT in to assist pt with bath. Tele leads reapplied, reading NSR. Will eval for IV restart after bath completed.

## 2021-01-21 ENCOUNTER — Inpatient Hospital Stay (HOSPITAL_COMMUNITY): Payer: 59

## 2021-01-21 ENCOUNTER — Encounter (HOSPITAL_COMMUNITY): Payer: Self-pay | Admitting: Radiology

## 2021-01-21 DIAGNOSIS — I313 Pericardial effusion (noninflammatory): Secondary | ICD-10-CM | POA: Diagnosis not present

## 2021-01-21 DIAGNOSIS — E872 Acidosis: Secondary | ICD-10-CM | POA: Diagnosis not present

## 2021-01-21 DIAGNOSIS — U071 COVID-19: Secondary | ICD-10-CM | POA: Diagnosis not present

## 2021-01-21 DIAGNOSIS — N179 Acute kidney failure, unspecified: Secondary | ICD-10-CM | POA: Diagnosis not present

## 2021-01-21 LAB — CK: Total CK: 49 U/L (ref 38–234)

## 2021-01-21 LAB — T4, FREE: Free T4: 1.15 ng/dL — ABNORMAL HIGH (ref 0.61–1.12)

## 2021-01-21 LAB — COMPREHENSIVE METABOLIC PANEL
ALT: 34 U/L (ref 0–44)
AST: 72 U/L — ABNORMAL HIGH (ref 15–41)
Albumin: 2.4 g/dL — ABNORMAL LOW (ref 3.5–5.0)
Alkaline Phosphatase: 194 U/L — ABNORMAL HIGH (ref 38–126)
Anion gap: 8 (ref 5–15)
BUN: 7 mg/dL (ref 6–20)
CO2: 22 mmol/L (ref 22–32)
Calcium: 8 mg/dL — ABNORMAL LOW (ref 8.9–10.3)
Chloride: 105 mmol/L (ref 98–111)
Creatinine, Ser: 0.86 mg/dL (ref 0.44–1.00)
GFR, Estimated: >60 mL/min (ref >60)
Glucose, Bld: 109 mg/dL — ABNORMAL HIGH (ref 70–99)
Potassium: 3.1 mmol/L — ABNORMAL LOW (ref 3.5–5.1)
Sodium: 135 mmol/L (ref 135–145)
Total Bilirubin: 0.1 mg/dL — ABNORMAL LOW (ref 0.3–1.2)
Total Protein: 5.1 g/dL — ABNORMAL LOW (ref 6.5–8.1)

## 2021-01-21 LAB — FOLATE: Folate: 17.9 ng/mL (ref >5.9)

## 2021-01-21 LAB — CULTURE, BLOOD (ROUTINE X 2)
Culture: NO GROWTH
Special Requests: ADEQUATE

## 2021-01-21 LAB — CBC
HCT: 34.2 % — ABNORMAL LOW (ref 36.0–46.0)
Hemoglobin: 11.2 g/dL — ABNORMAL LOW (ref 12.0–15.0)
MCH: 31.5 pg (ref 26.0–34.0)
MCHC: 32.7 g/dL (ref 30.0–36.0)
MCV: 96.3 fL (ref 80.0–100.0)
Platelets: 360 10*3/uL (ref 150–400)
RBC: 3.55 MIL/uL — ABNORMAL LOW (ref 3.87–5.11)
RDW: 14.6 % (ref 11.5–15.5)
WBC: 12.6 10*3/uL — ABNORMAL HIGH (ref 4.0–10.5)
nRBC: 0 % (ref 0.0–0.2)

## 2021-01-21 LAB — AMMONIA: Ammonia: 22 umol/L (ref 9–35)

## 2021-01-21 LAB — TSH: TSH: 2.063 u[IU]/mL (ref 0.350–4.500)

## 2021-01-21 LAB — MAGNESIUM: Magnesium: 1.7 mg/dL (ref 1.7–2.4)

## 2021-01-21 LAB — VITAMIN B12: Vitamin B-12: 701 pg/mL (ref 180–914)

## 2021-01-21 MED ORDER — METRONIDAZOLE 500 MG/100ML IV SOLN
500.0000 mg | Freq: Three times a day (TID) | INTRAVENOUS | Status: DC
  Administered 2021-01-21 – 2021-01-24 (×9): 500 mg via INTRAVENOUS
  Filled 2021-01-21 (×9): qty 100

## 2021-01-21 MED ORDER — MAGNESIUM SULFATE 2 GM/50ML IV SOLN
2.0000 g | Freq: Once | INTRAVENOUS | Status: AC
  Administered 2021-01-21: 2 g via INTRAVENOUS
  Filled 2021-01-21: qty 50

## 2021-01-21 MED ORDER — CIPROFLOXACIN IN D5W 400 MG/200ML IV SOLN
400.0000 mg | Freq: Two times a day (BID) | INTRAVENOUS | Status: DC
  Administered 2021-01-21 – 2021-01-24 (×6): 400 mg via INTRAVENOUS
  Filled 2021-01-21 (×6): qty 200

## 2021-01-21 MED ORDER — IOHEXOL 350 MG/ML SOLN
100.0000 mL | Freq: Once | INTRAVENOUS | Status: AC | PRN
  Administered 2021-01-21: 80 mL via INTRAVENOUS

## 2021-01-21 MED ORDER — IOHEXOL 9 MG/ML PO SOLN
ORAL | Status: AC
  Filled 2021-01-21: qty 1000

## 2021-01-21 NOTE — Progress Notes (Addendum)
PROGRESS NOTE  [name] FVC:[id number] DOB: June 25, 1970 DOA: 01/16/2021 PCP: Teodoro Kil, PA-C  Brief History:   50 y.o. female, with history of COPD, Corhn's, GERD, HTN, T2DM, and more presents to the ED with a chief complaint of seizure.  It is unclear how accurate patient's history is.  She reports that she was sitting on the toilet, attempting to void but could not start a stream, when she had a seizure.  When asked how she knew it was a seizure she reports it is because she has had seizures before.  Patient reports that she is supposed to be on seizure medication but she does not know the moderate the dose.  She reports that she remembers the whole thing.  There was no rhythmic jerking or twitching.  She reports she just knows it was a seizure.  Patient reports that she did not lose consciousness and remembers the whole thing.  Somehow, a "stranger" found her and brought her to the hospital per her report.  I discussed with the patient that her labs would indicate that she was laying on the ground for some time, and asked if she had lost time.  Patient reports that she hit the ground several times yesterday because she found out that her mother and father died at the same time in 2 different locations.  She was noted to have severe dehydration, rhabdomyolysis and AKI with SIRS of unknown source.       Assessment/Plan: SIRS/Sepsis -source initially unclear; further work up reveals colitis is likely source -presented with leukocytosis, tachycardia, AMS  -Presented with markedly elevated procalcitonin level and elevated lactic acid level was empirically started on IV antibiotics broad-spectrum at time of admission -initial PCT 18.54   -As procalcitonin is trending down >> de-escalating antibiotics but source remained unclear -9/20--d/c all antibiotics and observe -blood cultures neg -CXR--neg for consolidation -UA no pyuria  Abdominal pain/Loose Stools>>Colitis -01/21/21 CT  abd/pelvis--distal colitis, extending from the splenic flexure of the colon into the rectum;  walls of the distal rectum are ill-defined, and rectal perforation difficult to exclude. No drainable fluid collection identified. -review of medical records showed pt reports dx of Crohn's diagnosed 22 at [facility] but numerous studies including colonoscopy (05/24/2008) which showed normal terminal ileum and SBFT (07/26/2011) which showed normal SB have questioned if this was a true diagnosis -Given initial clinical improvement on IV abx and questionable Crohn's diagnosis>>restart abx (cipro/flagyl) -consult GI -stool pathogen panel -stool calprotectin   Acute metabolic encephalopathy - -Likely secondary to postictal state from withdrawal seizure and AKI --overall improved with supportive measures -01/21/21--significant other at bedside states pt is near baseline -CT did not show any acute abnormalities. -EEG unrevealing epileptiform activity noted -d/c Elavil and Atarax -UDS-->positive THC -01/16/21 VBG--no hypercarbia -check B12--701 -folate--17.9 -TSH--2.063 -ammonia--22 -9/16 UA--no pyuria   Xanax withdrawal seizure -appreciate neurology consult -PDMP reviewed--pt receives xanax 1 mg, #90 monthly -restarted xanax -she apparently ran out 3-4 days prior to admission and significant other found her lethargic on floor   Lactic acidosis -improving with supportive measures -lactate peaked 4.8 -due to delayed clearance -sepsis ruled out   AKI -serum creatinine peaked 5.36 -baseline creatinine 0.8-0.9 -improved with IVF -Appreciate nephrology consultation and recommendations   COVID infection -currently asymptomatic-following with supportive measures -stable on RA   Moderate pericardial effusion  - repeat Echo done 9/19 slightly larger effusion noted - Dr. Harl Bowie recommended repeating limited Echo in 72 hours -repeat  echo 01/22/21  Nontraumatic Rhabdomyolysis -CK  2980>>49 -improved with IVF   Right foot pain  - soft tissue injury only, xray images with no finding of fracture   Hypomagnesemia -replete         Status is: Inpatient   Remains inpatient appropriate because:Inpatient level of care appropriate due to severity of illness   Dispo: The patient is from: Home              Anticipated d/c is to: Home              Patient currently is not medically stable to d/c.              Difficult to place patient No               Family Communication:   significant other updated at bedside 9/21   Consultants:  renal, neurology   Code Status:  FULL    DVT Prophylaxis:  [place] Heparin      Procedures: As Listed in Progress Note Above   Antibiotics: None     Subjective: Patient complains of abd pain.  Denies cp, sob, f/c, n/v.  Has had some loose stool without blood.  No dysuria or hematuria  Objective: Vitals:   01/20/21 1301 01/20/21 1800 01/21/21 0617 01/21/21 1225  BP: 131/75 (!) 122/58 126/62 (!) 128/101  Pulse: (!) 101 (!) 103 100 99  Resp: 18 20 19 20   Temp: 99.1 F (37.3 C) 98.7 F (37.1 C) 98.4 F (36.9 C) 97.7 F (36.5 C)  TempSrc: Oral Oral Oral Oral  SpO2: 91% 91% 93% 94%  Weight:      Height:        Intake/Output Summary (Last 24 hours) at 01/21/2021 1317 Last data filed at 01/21/2021 0900 Gross per 24 hour  Intake 720 ml  Output --  Net 720 ml   Weight change:  Exam:  General:  Pt is alert, follows commands appropriately, not in acute distress HEENT: No icterus, No thrush, No neck mass, [place]/AT Cardiovascular: RRR, S1/S2, no rubs, no gallops Respiratory: CTA bilaterally, no wheezing, no crackles, no rhonchi Abdomen: Soft/+BS, non tender, non distended, no guarding Extremities: No edema, No lymphangitis, No petechiae, No rashes, no synovitis   Data Reviewed: I have personally reviewed following labs and imaging studies Basic Metabolic Panel: Recent Labs  Lab 01/16/21 0823 01/17/21 0444  01/18/21 0308 01/19/21 0620 01/21/21 0533  NA 134* 134* 134* 133* 135  K 3.2* 4.6 3.7 3.3* 3.1*  CL 104 103 105 101 105  CO2 16* 20* 24 26 22   GLUCOSE 125* 118* 103* 96 109*  BUN 34* 15 10 9 7   CREATININE 3.53* 1.27* 0.85 0.96 0.86  CALCIUM 7.5* 8.5* 7.8* 8.2* 8.0*  MG  --  1.8 1.6* 2.0 1.7  PHOS 3.5  --   --   --   --    Liver Function Tests: Recent Labs  Lab 01/16/21 0041 01/16/21 0823 01/17/21 0444 01/18/21 0308 01/21/21 0533  AST 79*  --  67* 42* 72*  ALT 38  --  32 23 34  ALKPHOS 195*  --  142* 93 194*  BILITOT 0.4  --  0.6 0.6 0.1*  PROT 6.9  --  5.6* 4.4* 5.1*  ALBUMIN 3.5 3.0* 2.7* 2.1* 2.4*   No results for input(s): LIPASE, AMYLASE in the last 168 hours. Recent Labs  Lab 01/21/21 0533  AMMONIA 22   Coagulation Profile: No results for input(s): INR, PROTIME in  the last 168 hours. CBC: Recent Labs  Lab 01/16/21 0041 01/17/21 0444 01/18/21 0308 01/21/21 0533  WBC 13.5* 10.7* 9.5 12.6*  NEUTROABS  --  8.8* 7.9*  --   HGB 13.9 12.6 10.9* 11.2*  HCT 43.4 37.9 33.2* 34.2*  MCV 95.8 92.9 94.1 96.3  PLT 319 277 222 360   Cardiac Enzymes: Recent Labs  Lab 01/16/21 0041 01/17/21 0444 01/18/21 0308 01/21/21 0533  CKTOTAL 2,980* 1,451* 313* 49   BNP: Invalid input(s): POCBNP CBG: No results for input(s): GLUCAP in the last 168 hours. HbA1C: No results for input(s): HGBA1C in the last 72 hours. Urine analysis:    Component Value Date/Time   COLORURINE YELLOW 01/16/2021 0256   APPEARANCEUR HAZY (A) 01/16/2021 0256   LABSPEC 1.008 01/16/2021 0256   PHURINE 6.0 01/16/2021 0256   GLUCOSEU NEGATIVE 01/16/2021 0256   GLUCOSEU NEG mg/dL 12/24/2009 2317   HGBUR LARGE (A) 01/16/2021 0256   BILIRUBINUR NEGATIVE 01/16/2021 0256   KETONESUR NEGATIVE 01/16/2021 0256   PROTEINUR 30 (A) 01/16/2021 0256   UROBILINOGEN 0.2 12/26/2013 1530   NITRITE NEGATIVE 01/16/2021 0256   LEUKOCYTESUR NEGATIVE 01/16/2021 0256   Sepsis  Labs: @LABRCNTIP (procalcitonin:4,lacticidven:4) ) Recent Results (from the past 240 hour(s))  Resp Panel by RT-PCR (Flu A&B, Covid) Nasopharyngeal Swab     Status: Abnormal   Collection Time: 01/16/21  2:32 AM   Specimen: Nasopharyngeal Swab; Nasopharyngeal(NP) swabs in vial transport medium  Result Value Ref Range Status   SARS Coronavirus 2 by RT PCR POSITIVE (A) NEGATIVE Final    Comment: RESULT CALLED TO, READ BACK BY AND VERIFIED WITH: HEARN,J@[id number]  by Matthews,b 9.16.22 (NOTE) SARS-CoV-2 target nucleic acids are DETECTED.  The SARS-CoV-2 RNA is generally detectable in upper respiratory specimens during the acute phase of infection. Positive results are indicative of the presence of the identified virus, but do not rule out bacterial infection or co-infection with other pathogens not detected by the test. Clinical correlation with patient history and other diagnostic information is necessary to determine patient infection status. The expected result is Negative.  Fact Sheet for Patients: EntrepreneurPulse.com.au  Fact Sheet for Healthcare Providers: IncredibleEmployment.be  This test is not yet approved or cleared by the [place] FDA and  has been authorized for detection and/or diagnosis of SARS-CoV-2 by FDA under an Emergency Use Authorization (EUA).  This EUA will remain in effect (meaning this test can b e used) for the duration of  the COVID-19 declaration under Section 564(b)(1) of the Act, 21 U.S.C. section 360bbb-3(b)(1), unless the authorization is terminated or revoked sooner.     Influenza A by PCR NEGATIVE NEGATIVE Final   Influenza B by PCR NEGATIVE NEGATIVE Final    Comment: (NOTE) The Xpert Xpress SARS-CoV-2/FLU/RSV plus assay is intended as an aid in the diagnosis of influenza from Nasopharyngeal swab specimens and should not be used as a sole basis for treatment. Nasal washings and aspirates are unacceptable for  Xpert Xpress SARS-CoV-2/FLU/RSV testing.  Fact Sheet for Patients: EntrepreneurPulse.com.au  Fact Sheet for Healthcare Providers: IncredibleEmployment.be  This test is not yet approved or cleared by the [place] FDA and has been authorized for detection and/or diagnosis of SARS-CoV-2 by FDA under an Emergency Use Authorization (EUA). This EUA will remain in effect (meaning this test can be used) for the duration of the COVID-19 declaration under Section 564(b)(1) of the Act, 21 U.S.C. section 360bbb-3(b)(1), unless the authorization is terminated or revoked.  Performed at [facility], [address], [place], [place]  [address]   MRSA Next Gen by PCR, Nasal     Status: None   Collection Time: 01/16/21  5:11 AM   Specimen: Nasal Mucosa; Nasal Swab  Result Value Ref Range Status   MRSA by PCR Next Gen NOT DETECTED NOT DETECTED Final    Comment: (NOTE) The GeneXpert MRSA Assay (FDA approved for NASAL specimens only), is one component of a comprehensive MRSA colonization surveillance program. It is not intended to diagnose MRSA infection nor to guide or monitor treatment for MRSA infections. Test performance is not FDA approved in patients less than 107 years old. Performed at [facility], [address], [place], [place] [address]   Culture, blood (routine x 2)     Status: None   Collection Time: 01/16/21  8:24 AM   Specimen: BLOOD RIGHT ARM  Result Value Ref Range Status   Specimen Description   Final    BLOOD RIGHT ARM BOTTLES DRAWN AEROBIC AND ANAEROBIC   Special Requests Blood Culture adequate volume  Final   Culture   Final    NO GROWTH 5 DAYS Performed at [facility], [address], [place], [place] [address]    Report Status 01/21/2021 FINAL  Final     Scheduled Meds:  iohexol       Chlorhexidine Gluconate Cloth  6 each Topical Daily   cycloSPORINE  1 drop Both Eyes BID   DULoxetine  30 mg Oral Daily   ezetimibe  10 mg Oral  Daily   folic acid  1 mg Oral Daily   gabapentin  300 mg Oral TID   heparin  5,000 Units Subcutaneous Q8H   levETIRAcetam  750 mg Oral BID   multivitamin with minerals  1 tablet Oral Daily   nicotine  14 mg Transdermal Daily   pantoprazole  40 mg Oral Daily   thiamine  100 mg Oral Daily   Or   thiamine  100 mg Intravenous Daily   topiramate  25 mg Oral BID   Continuous Infusions:  lactated ringers 10 mL/hr at 01/19/21 1604    Procedures/Studies: CT HEAD WO CONTRAST (5MM)  Result Date: 01/16/2021 CLINICAL DATA:  Status post seizure. EXAM: CT HEAD WITHOUT CONTRAST TECHNIQUE: Contiguous axial images were obtained from the base of the skull through the vertex without intravenous contrast. COMPARISON:  June 03, 2020 FINDINGS: Brain: No evidence of acute infarction, hemorrhage, hydrocephalus, extra-axial collection or mass lesion/mass effect. Vascular: No hyperdense vessel or unexpected calcification. Skull: Normal. Negative for fracture or focal lesion. Sinuses/Orbits: A small right maxillary sinus air-fluid level is seen. Other: None. IMPRESSION: 1. No acute intracranial abnormality. Electronically Signed   By: Virgina Norfolk M.D.   On: 01/16/2021 01:08   US RENAL  Result Date: 01/16/2021 CLINICAL DATA:  Acute kidney injury EXAM: RENAL / URINARY TRACT ULTRASOUND COMPLETE COMPARISON:  None. FINDINGS: Right Kidney: Renal measurements: 10.7 x 5.0 x 5.5 cm = volume: 156 mL. Echogenicity within normal limits. No mass or hydronephrosis visualized. Left Kidney: Renal measurements: 10.3 x 6.0 x 5.3 cm = volume: 172 mL. Echogenicity within normal limits. No mass or hydronephrosis visualized. Bladder: Appears normal for degree of bladder distention. Bilateral ureteral jets are not noted, and the patient is not able to void at the time of the examination. Other: None. IMPRESSION: 1. Unremarkable sonographic appearance of the kidneys. No hydronephrosis. 2. Bilateral ureteral jets are not noted, and the  patient is not able to void at the time of the examination. Correlate for urinary retention.  Electronically Signed   By: Eddie Candle M.D.   On: 01/16/2021 13:04   US Carotid Bilateral  Result Date: 01/16/2021 CLINICAL DATA:  Altered mental status, seizures EXAM: BILATERAL CAROTID DUPLEX ULTRASOUND TECHNIQUE: Pearline Cables scale imaging, color Doppler and duplex ultrasound were performed of bilateral carotid and vertebral arteries in the neck. COMPARISON:  None. FINDINGS: Criteria: Quantification of carotid stenosis is based on velocity parameters that correlate the residual internal carotid diameter with NASCET-based stenosis levels, using the diameter of the distal internal carotid lumen as the denominator for stenosis measurement. The following velocity measurements were obtained: RIGHT ICA: 127/43 cm/sec CCA: 700/17 cm/sec SYSTOLIC ICA/CCA RATIO:  0.9 ECA:  118 cm/sec LEFT ICA: 145/41 cm/sec CCA: 494/49 cm/sec SYSTOLIC ICA/CCA RATIO:  1.1 ECA:  107 cm/sec RIGHT CAROTID ARTERY: No significant atherosclerotic plaque or evidence of stenosis in the internal carotid artery. RIGHT VERTEBRAL ARTERY:  Patent with normal antegrade flow. LEFT CAROTID ARTERY: Trace smooth heterogeneous atherosclerotic plaque in the proximal internal carotid artery. By peak systolic velocity criteria in the region of plaque, the estimated stenosis is less than 50%. LEFT VERTEBRAL ARTERY:  Patent with normal antegrade flow. IMPRESSION: 1. Mild (1-49%) stenosis proximal left internal carotid artery secondary to trace smooth heterogeneous atherosclerotic plaque. 2. No significant atherosclerotic plaque or evidence of stenosis in the right internal carotid artery. 3. Vertebral arteries are patent with normal antegrade flow. Signed, Criselda Peaches, MD, [facility] Electronically Signed   By: Jacqulynn Cadet M.D.   On: 01/16/2021 11:00   DG CHEST PORT 1 VIEW  Result Date:  01/17/2021 CLINICAL DATA:  Severe sepsis.  Patient is COVID-19 positive. EXAM: PORTABLE CHEST 1 VIEW COMPARISON:  January 16, 2021 FINDINGS: Heart, hila, mediastinum are normal. No pneumothorax. No nodules or masses. Minimal haziness in the left costophrenic angle, likely atelectasis. No suspicious infiltrates are identified. IMPRESSION: Minimal haziness in the left costophrenic angle is favored to represent atelectasis. No other abnormalities are identified. No suspicious infiltrates. Electronically Signed   By: Dorise Bullion III M.D.   On: 01/17/2021 08:13   DG Chest Port 1 View  Result Date: 01/16/2021 CLINICAL DATA:  Seizure. EXAM: PORTABLE CHEST 1 VIEW COMPARISON:  May 09, 2016 FINDINGS: The heart size and mediastinal contours are within normal limits. Both lungs are clear. The visualized skeletal structures are unremarkable. IMPRESSION: No active disease. Electronically Signed   By: Virgina Norfolk M.D.   On: 01/16/2021 01:02   DG Foot 2 Views Right  Result Date: 01/19/2021 CLINICAL DATA:  Recent fall with bruising EXAM: RIGHT FOOT - 2 VIEW COMPARISON:  None. FINDINGS: There is no evidence of fracture or dislocation. There is no evidence of arthropathy or other focal bone abnormality. Soft tissues are unremarkable. IMPRESSION: Negative. Electronically Signed   By: Jorje Guild M.D.   On: 01/19/2021 11:14   EEG adult  Result Date: 01/16/2021 Lora Havens, MD     01/16/2021 10:16 PM Patient Name: [name] MRN: [id number] Epilepsy Attending: Lora Havens Referring Physician/Provider: Dr Clearence Ped, [place] B Date: 01/16/2021 Duration: 22.32 mins Patient history: 50 yo F with AMS and metabolic changes, Likely due to xanax withdrawal seizures. EEG to evaluate for seizure Level of alertness: Awake AEDs during EEG study: LEV, GBP Technical aspects: This EEG study was done with scalp electrodes positioned according to the 10-20 International system of electrode placement. Electrical  activity was acquired at a sampling rate of 500Hz  and reviewed with a high frequency  filter of 70Hz  and a low frequency filter of 1Hz . EEG data were recorded continuously and digitally stored. Description: The posterior dominant rhythm consists of 9 Hz activity of moderate voltage (25-35 uV) seen predominantly in posterior head regions, symmetric and reactive to eye opening and eye closing. EEG showed intermittent generalized 5 to 6 Hz theta slowing. Hyperventilation and photic stimulation were not performed.   ABNORMALITY - Intermittent slow, generalized IMPRESSION: This study is suggestive of mild diffuse encephalopathy, nonspecific etiology. No seizures or epileptiform discharges were seen throughout the recording. Lora Havens   ECHOCARDIOGRAM COMPLETE  Result Date: 01/16/2021    ECHOCARDIOGRAM REPORT   Patient Name:   [name] Date of Exam: 01/16/2021 Medical Rec #:  [id number]     Height:       66.0 in Accession #:    [id number]    Weight:       145.0 lb Date of Birth:  09-12-1970     BSA:          1.744 m Patient Age:    36 years      BP:           81/20 mmHg Patient Gender: F             HR:           111 bpm. Exam Location:  [name] Procedure: 2D Echo, Cardiac Doppler and Color Doppler Indications:    Syncope  History:        Patient has prior history of Echocardiogram examinations, most                 recent 07/11/2010. COPD, Signs/Symptoms:Dyspnea; Risk                 Factors:Tobacco abuse and Diabetes. COVID +.  Sonographer:    Wenda Low Referring Phys: ASIA B [place]  1. Left ventricular ejection fraction, by estimation, is 70 to 75%. The left ventricle has hyperdynamic function. The left ventricle has no regional wall motion abnormalities. There is mild left ventricular hypertrophy. Left ventricular diastolic parameters are indeterminate.  2. Right ventricular systolic function is normal. The right ventricular size is normal. Tricuspid regurgitation signal is  inadequate for assessing PA pressure.  3. No right atrial or right ventricular collapse to suggest tamponade physiology. Respiratory variation in mitral outflow not assessed. Presence of sinus tachycardia does raise concern however for potential hemodynamic significance. Follow-up clinically  and suggest repeat limited echocardiogram within tht next 48 hours. Moderate pericardial effusion. The pericardial effusion is circumferential.  4. The mitral valve is grossly normal. Mild mitral valve regurgitation.  5. The aortic valve is tricuspid. Aortic valve regurgitation is not visualized. No aortic stenosis is present. Aortic valve mean gradient measures 6.0 mmHg.  6. The inferior vena cava is normal in size with <50% respiratory variability, suggesting right atrial pressure of 8 mmHg. Comparison(s): No prior Echocardiogram. FINDINGS  Left Ventricle: Left ventricular ejection fraction, by estimation, is 70 to 75%. The left ventricle has hyperdynamic function. The left ventricle has no regional wall motion abnormalities. The left ventricular internal cavity size was normal in size. There is mild left ventricular hypertrophy. Left ventricular diastolic parameters are indeterminate. Right Ventricle: The right ventricular size is normal. No increase in right ventricular wall thickness. Right ventricular systolic function is normal. Tricuspid regurgitation signal is inadequate for assessing PA pressure. Left Atrium: Left atrial size was normal in size. Right Atrium: Right atrial size was normal in size. Pericardium:  No right atrial or right ventricular collapse to suggest tamponade physiology. Respiratory variation in mitral outflow not assessed. Presence of sinus tachycardia does raise concern however for potential hemodynamic significance. Follow-up clinically and suggest repeat limited echocardiogram within tht next 48 hours. A moderately sized pericardial effusion is present. The pericardial effusion is circumferential.  Mitral Valve: The mitral valve is grossly normal. Mild mitral valve regurgitation. MV peak gradient, 13.1 mmHg. The mean mitral valve gradient is 6.0 mmHg. Tricuspid Valve: The tricuspid valve is grossly normal. Tricuspid valve regurgitation is trivial. Aortic Valve: The aortic valve is tricuspid. Aortic valve regurgitation is not visualized. No aortic stenosis is present. Aortic valve mean gradient measures 6.0 mmHg. Aortic valve peak gradient measures 10.8 mmHg. Aortic valve area, by VTI measures 2.42  cm. Pulmonic Valve: The pulmonic valve was grossly normal. Pulmonic valve regurgitation is trivial. Aorta: The aortic root is normal in size and structure. Venous: The inferior vena cava is normal in size with less than 50% respiratory variability, suggesting right atrial pressure of 8 mmHg. IAS/Shunts: No atrial level shunt detected by color flow Doppler.  LEFT VENTRICLE PLAX 2D LVIDd:         3.70 cm  Diastology LVIDs:         2.40 cm  LV e' medial:    9.17 cm/s LV PW:         1.10 cm  LV E/e' medial:  11.2 LV IVS:        1.10 cm  LV e' lateral:   9.17 cm/s LVOT diam:     2.00 cm  LV E/e' lateral: 11.2 LV SV:         58 LV SV Index:   33 LVOT Area:     3.14 cm  RIGHT VENTRICLE RV Basal diam:  2.30 cm RV Mid diam:    2.00 cm RV S prime:     27.70 cm/s TAPSE (M-mode): 3.3 cm LEFT ATRIUM             Index       RIGHT ATRIUM           Index LA diam:        3.30 cm 1.89 cm/m  RA Area:     10.80 cm LA Vol (A2C):   41.8 ml 23.96 ml/m RA Volume:   17.70 ml  10.15 ml/m LA Vol (A4C):   34.7 ml 19.89 ml/m LA Biplane Vol: 38.5 ml 22.07 ml/m  AORTIC VALVE AV Area (Vmax):    2.47 cm AV Area (Vmean):   2.20 cm AV Area (VTI):     2.42 cm AV Vmax:           164.00 cm/s AV Vmean:          109.000 cm/s AV VTI:            0.240 m AV Peak Grad:      10.8 mmHg AV Mean Grad:      6.0 mmHg LVOT Vmax:         129.00 cm/s LVOT Vmean:        76.400 cm/s LVOT VTI:          0.185 m LVOT/AV VTI ratio: 0.77  AORTA Ao Root diam: 3.20  cm MITRAL VALVE MV Area (PHT): 5.02 cm     SHUNTS MV Area VTI:   1.73 cm     Systemic VTI:  0.18 m MV Peak grad:  13.1 mmHg    Systemic Diam: 2.00 cm  MV Mean grad:  6.0 mmHg MV Vmax:       1.81 m/s MV Vmean:      109.0 cm/s MV Decel Time: 151 msec MV E velocity: 103.00 cm/s MV A velocity: 168.00 cm/s MV E/A ratio:  0.61 Rozann Lesches MD Electronically signed by Rozann Lesches MD Signature Date/Time: 01/16/2021/4:08:15 PM    Final    ECHOCARDIOGRAM LIMITED  Result Date: 01/19/2021    ECHOCARDIOGRAM LIMITED REPORT   Patient Name:   [name] Date of Exam: 01/19/2021 Medical Rec #:  [id number]     Height:       66.0 in Accession #:    [id number]    Weight:       161.6 lb Date of Birth:  03-12-1971     BSA:          1.827 m Patient Age:    47 years      BP:           99/68 mmHg Patient Gender: F             HR:           96 bpm. Exam Location:  [name] Procedure: Limited Echo Indications:    Pericardial effusion  History:        Patient has prior history of Echocardiogram examinations, most                 recent 01/16/2021. COPD, Arrythmias:PVC, Signs/Symptoms:Chest                 Pain; Risk Factors:Diabetes. COVID +, Tobacco Abuse, Sepsis,                 Pericardial effusion.  Sonographer:    Wenda Low Referring Phys: [place]  1. Left ventricular ejection fraction, by estimation, is 60 to 65%. The left ventricle has normal function.  2. Moderate pericardial effusion. The pericardial effusion is circumferential. There is no evidence of cardiac tamponade. Effusion looks larger compared to the 01/16/2021 study however remains overall moderate by measurements. Recommend repeating limited  study in 72 hours. FINDINGS  Left Ventricle: Left ventricular ejection fraction, by estimation, is 60 to 65%. The left ventricle has normal function. Pericardium: A moderately sized pericardial effusion is present. The pericardial effusion is circumferential. There is no evidence of cardiac  tamponade. LEFT VENTRICLE PLAX 2D LVIDd:         3.90 cm LVIDs:         2.40 cm LV PW:         1.00 cm LV IVS:        1.00 cm LVOT diam:     2.00 cm LVOT Area:     3.14 cm  LEFT ATRIUM         Index LA diam:    3.40 cm 1.86 cm/m   SHUNTS Systemic Diam: 2.00 cm Carlyle Dolly MD Electronically signed by Carlyle Dolly MD Signature Date/Time: 01/19/2021/11:20:45 AM    Final    [place] EKG SITE RITE  Result Date: 01/16/2021 If Site Rite image not attached, placement could not be confirmed due to current cardiac rhythm.   Orson Eva, DO  [facility]  If 7PM-7AM, please contact night-coverage [email or web address] Password [email or web address] 01/21/2021, 1:17 PM   LOS: 5 days

## 2021-01-21 NOTE — Plan of Care (Signed)
  Problem: Activity: Goal: Risk for activity intolerance will decrease Outcome: Progressing   Problem: Coping: Goal: Level of anxiety will decrease Outcome: Progressing   Problem: Safety: Goal: Ability to remain free from injury will improve Outcome: Progressing   

## 2021-01-22 ENCOUNTER — Encounter (HOSPITAL_COMMUNITY): Payer: Self-pay | Admitting: Family Medicine

## 2021-01-22 ENCOUNTER — Inpatient Hospital Stay (HOSPITAL_COMMUNITY): Payer: 59

## 2021-01-22 DIAGNOSIS — N179 Acute kidney failure, unspecified: Secondary | ICD-10-CM | POA: Diagnosis not present

## 2021-01-22 DIAGNOSIS — I313 Pericardial effusion (noninflammatory): Secondary | ICD-10-CM | POA: Diagnosis not present

## 2021-01-22 LAB — COMPREHENSIVE METABOLIC PANEL
ALT: 33 U/L (ref 0–44)
AST: 56 U/L — ABNORMAL HIGH (ref 15–41)
Albumin: 2.7 g/dL — ABNORMAL LOW (ref 3.5–5.0)
Alkaline Phosphatase: 208 U/L — ABNORMAL HIGH (ref 38–126)
Anion gap: 9 (ref 5–15)
BUN: 6 mg/dL (ref 6–20)
CO2: 21 mmol/L — ABNORMAL LOW (ref 22–32)
Calcium: 8.2 mg/dL — ABNORMAL LOW (ref 8.9–10.3)
Chloride: 105 mmol/L (ref 98–111)
Creatinine, Ser: 0.88 mg/dL (ref 0.44–1.00)
GFR, Estimated: >60 mL/min (ref >60)
Glucose, Bld: 94 mg/dL (ref 70–99)
Potassium: 3.5 mmol/L (ref 3.5–5.1)
Sodium: 135 mmol/L (ref 135–145)
Total Bilirubin: 0.2 mg/dL — ABNORMAL LOW (ref 0.3–1.2)
Total Protein: 6 g/dL — ABNORMAL LOW (ref 6.5–8.1)

## 2021-01-22 LAB — CBC WITH DIFFERENTIAL/PLATELET
Band Neutrophils: 8 %
Basophils Absolute: 0 10*3/uL (ref 0.0–0.1)
Basophils Relative: 0 %
Eosinophils Absolute: 0.2 10*3/uL (ref 0.0–0.5)
Eosinophils Relative: 1 %
HCT: 38 % (ref 36.0–46.0)
Hemoglobin: 12 g/dL (ref 12.0–15.0)
Lymphocytes Relative: 16 %
Lymphs Abs: 2.9 10*3/uL (ref 0.7–4.0)
MCH: 30.2 pg (ref 26.0–34.0)
MCHC: 31.6 g/dL (ref 30.0–36.0)
MCV: 95.7 fL (ref 80.0–100.0)
Metamyelocytes Relative: 4 %
Monocytes Absolute: 1.5 10*3/uL — ABNORMAL HIGH (ref 0.1–1.0)
Monocytes Relative: 8 %
Neutro Abs: 13.1 10*3/uL — ABNORMAL HIGH (ref 1.7–7.7)
Neutrophils Relative %: 63 %
Platelets: 392 10*3/uL (ref 150–400)
RBC: 3.97 MIL/uL (ref 3.87–5.11)
RDW: 14.8 % (ref 11.5–15.5)
WBC: 18.4 10*3/uL — ABNORMAL HIGH (ref 4.0–10.5)
nRBC: 0 % (ref 0.0–0.2)

## 2021-01-22 LAB — ECHOCARDIOGRAM LIMITED
Height: 66 in
S' Lateral: 2.4 cm
Weight: 2585.55 oz

## 2021-01-22 LAB — MAGNESIUM: Magnesium: 2.3 mg/dL (ref 1.7–2.4)

## 2021-01-22 NOTE — Progress Notes (Signed)
*  PRELIMINARY RESULTS* Echocardiogram 2D Echocardiogram has been performed.  [name] 01/22/2021, 10:25 AM

## 2021-01-22 NOTE — Plan of Care (Signed)
  Problem: Education: Goal: Knowledge of General Education information will improve Description Including pain rating scale, medication(s)/side effects and non-pharmacologic comfort measures Outcome: Progressing   Problem: Health Behavior/Discharge Planning: Goal: Ability to manage health-related needs will improve Outcome: Progressing   

## 2021-01-22 NOTE — Consult Note (Addendum)
Referring Provider: Roxan Hockey, MD Primary Care Physician:  Teodoro Kil, PA-C Primary Gastroenterologist: Dr. Hildred Laser Admission date 01/16/2021 Date of consultation 01/21/2021  Reason for Consultation:  colitis  HPI: [name] is a 50 y.o. female with history of COPD, IBS, GERD, hypertension, type 2 diabetes mellitus presented to the ED with chief complaint of seizure.  Patient reports no loss of consciousness, remembers the whole thing.  States she just knows it was a seizure because she has had them before.  Reports not being on seizure medication when she should be.  A "stranger" found her and brought her to the ED.  Episode occurred while sitting on the toilet trying to void.  On presentation her total CK was elevated at 2980.  Patient does not believe she was laying on the ground for significant length of time.   On presentation her blood pressure was 94/51.  BUN was 42, creatinine 5.36 (baseline around 1), white blood cell count 13,500, undetectable Tylenol/aspirin/alcohol levels.  Alkaline phosphatase 195, AST 79, ALT 38, urine drug screen positive for THC (negative for benzos, prescription for Xanax 3 times daily), CT head with no acute intracranial abnormality, chest x-ray with no active disease, COVID came back positive.  Neurology consulted on the patient suspect her altered mental status changes and metabolic changes due to Xanax withdrawal seizures similar to other events she has had in the past.  Elevated CK likely due to seizures.Creatinine was elevated at 5.36 on presentation and now normal.   GI consulted for abdominal pain, loose stools, colitis on CT.  CT imaging yesterday with distal colitis, extending from the splenic flexure of the colon into the rectum.  Also the distal rectum ill-defined.  Rectal perforation difficult to exclude.  No drainable fluid collection.  Moderate perirectal soft tissue stranding.  Gas within the wall of the bladder without bladder  wall thickening or immediate surrounding inflammatory changes.  Could be iatrogenic, although if catheterization not recently performed, colovesical fistula would be a consideration.  Bilateral pleural effusions with small pericardial effusion.  Cipro and Flagyl started.  Stool for GI pathogen panel and fecal calprotectin have been ordered but not collected.  Patient reports Crohn's previously diagnosed in 1998 at [facility]. Has had numerous colonoscopies and imaging since then without evidence of active Crohn's.  Patient reports she has had diarrhea ever since she has been in the hospital although reports it is better yesterday and today. Has not had enough to collect specimen. She denies abdominal pain. States she has NO pain. No melena, brbpr. Heartburn well controlled on omeprazole. No dysphagia. No weight loss. Takes advil on occasion.    Pertinent labs: White blood cell count today 18,400, up from 12,600 yesterday.  Hemoglobin 12.  Platelets 392,000.  BUN 6, creatinine 0.88.  Total bili has been normal.  Alkaline phosphatase 195--> 142--> 93--> 194--> 208.  AST 79-->67-->42-->72-->56, ALT has been normal.  B12 704.  EGD/ED June 2016: -Small hiatal hernia -Esophagus dilated with 56 French Maloney dilator  Colonoscopy January 2010: -Anal papillae, internal hemorrhoids, normal colon, normal terminal ileum -Poor prep but certainly no evidence of appendectomy ED  EGD/ED May 2008: -Normal esophagus, status post passage of 56 [place] Maloney dilator -Small hiatal hernia -Otherwise normal stomach, D1, D2  EGD/colonoscopy July 2007: -Normal esophagus, stomach, D1, D2 -Anal Papillae today and internal hemorrhoids, otherwise normal rectum and colon.  Normal terminal ileum  EGD/colonoscopy 2002 [facility] Reports not available Pathology with negative small bowel biopsy.  Hyperplastic rectal polyp  SBFT 2013: Mildly slow small bowel transit time.   Prior to Admission medications   Medication Sig  Start Date End Date Taking? Authorizing Provider  ALPRAZolam [name]) 1 MG tablet Take 1 mg by mouth 3 (three) times daily as needed for anxiety. 07/11/19  Yes [provider]  amitriptyline (ELAVIL) 75 MG tablet Take 75 mg by mouth at bedtime. 12/16/20  Yes [provider]  cetirizine (ZYRTEC) 10 MG tablet Take 10 mg by mouth daily. 03/30/19  Yes [provider]  cyclobenzaprine (FLEXERIL) 10 MG tablet Take 10 mg by mouth 3 (three) times daily as needed for muscle spasms. 07/11/19  Yes [provider]  diphenoxylate-atropine (LOMOTIL) 2.5-0.025 MG tablet Take 1 tablet 4 (four) times daily as needed by mouth for diarrhea or loose stools. 03/17/17  Yes Idol, Almyra Free, PA-C  DULoxetine (CYMBALTA) 30 MG capsule Take 30 mg by mouth daily. 12/15/20  Yes [provider]  gabapentin (NEURONTIN) 300 MG capsule Take 300 mg by mouth 3 (three) times daily. 11/20/20  Yes [provider]  hydrOXYzine (ATARAX/VISTARIL) 25 MG tablet Take 1 tablet by mouth at bedtime. 07/11/19  Yes [provider]  nitroGLYCERIN (NITROSTAT) 0.4 MG SL tablet Place 1 tablet (0.4 mg total) under the tongue every 5 (five) minutes as needed for chest pain. 04/15/15  Yes Herminio Commons, MD  omeprazole (PRILOSEC) 40 MG capsule Take 40 mg by mouth daily. 07/21/19  Yes [provider]  ondansetron (ZOFRAN ODT) 8 MG disintegrating tablet Take 1 tablet (8 mg total) every 8 (eight) hours as needed by mouth for nausea or vomiting. 03/17/17  Yes Idol, Almyra Free, PA-C  pregabalin (LYRICA) 100 MG capsule Take 100 mg by mouth 3 (three) times daily. 12/16/20  Yes [provider]  promethazine (PHENERGAN) 25 MG tablet Take 1 tablet (25 mg total) by mouth every 6 (six) hours as needed for nausea or vomiting. 07/30/19  Yes Noemi Chapel, MD  RESTASIS 0.05 % ophthalmic emulsion Place 1 drop into both eyes 2 (two) times daily. 12/12/20  Yes [provider]  SPIRIVA RESPIMAT 2.5  MCG/ACT AERS Inhale 1 puff into the lungs daily as needed (shortness of breath). 12/12/20  Yes [provider]  topiramate (TOPAMAX) 25 MG tablet Take 1 tablet by mouth in the morning and at bedtime. 10/29/20  Yes [provider]  ezetimibe (ZETIA) 10 MG tablet Take 10 mg by mouth daily. 12/15/20   [provider]  levETIRAcetam (KEPPRA) 750 MG tablet Take 750 mg by mouth 2 (two) times daily. 09/30/20   [provider]    Current Facility-Administered Medications  Medication Dose Route Frequency Provider Last Rate Last Admin   acetaminophen (TYLENOL) tablet 650 mg  650 mg Oral Q6H PRN Zierle-Ghosh, Asia B, DO   650 mg at 01/17/21 2132   Or   acetaminophen (TYLENOL) suppository 650 mg  650 mg Rectal Q6H PRN Zierle-Ghosh, Asia B, DO       albuterol (VENTOLIN HFA) 108 (90 Base) MCG/ACT inhaler 2 puff  2 puff Inhalation Q4H PRN Zierle-Ghosh, Asia B, DO       ALPRAZolam (XANAX) tablet 1 mg  1 mg Oral TID PRN Zierle-Ghosh, Asia B, DO   1 mg at 01/21/21 2013   Chlorhexidine Gluconate Cloth 2 % PADS 6 each  6 each Topical Daily Wynetta Emery, Clanford L, MD   6 each at 01/21/21 0955   ciprofloxacin (CIPRO) IVPB 400 mg  400 mg Intravenous Q12H Orson Eva, MD 200 mL/hr at  01/22/21 0429 400 mg at 01/22/21 0429   cycloSPORINE (RESTASIS) 0.05 % ophthalmic emulsion 1 drop  1 drop Both Eyes BID Wynetta Emery, Clanford L, MD   1 drop at 01/21/21 2015   DULoxetine (CYMBALTA) DR capsule 30 mg  30 mg Oral Daily Johnson, Clanford L, MD   30 mg at 01/21/21 0949   ezetimibe (ZETIA) tablet 10 mg  10 mg Oral Daily Johnson, Clanford L, MD   10 mg at 95/63/87 5643   folic acid (FOLVITE) tablet 1 mg  1 mg Oral Daily Zierle-Ghosh, Asia B, DO   1 mg at 01/21/21 0950   gabapentin (NEURONTIN) capsule 300 mg  300 mg Oral TID Wynetta Emery, Clanford L, MD   300 mg at 01/21/21 2014   heparin injection 5,000 Units  5,000 Units Subcutaneous Q8H Zierle-Ghosh, Asia B, DO   5,000 Units at 01/22/21 3295   lactated  ringers infusion   Intravenous Continuous Wynetta Emery, Clanford L, MD 10 mL/hr at 01/19/21 1604 Rate Change at 01/19/21 1604   levETIRAcetam (KEPPRA) tablet 750 mg  750 mg Oral BID Wynetta Emery, Clanford L, MD   750 mg at 01/21/21 2014   metroNIDAZOLE (FLAGYL) IVPB 500 mg  500 mg Intravenous Franco Collet, MD 100 mL/hr at 01/21/21 2355 500 mg at 01/21/21 2355   multivitamin with minerals tablet 1 tablet  1 tablet Oral Daily Zierle-Ghosh, Asia B, DO   1 tablet at 01/21/21 0949   nicotine (NICODERM CQ - dosed in mg/24 hours) patch 14 mg  14 mg Transdermal Daily Zierle-Ghosh, Asia B, DO       ondansetron (ZOFRAN) tablet 4 mg  4 mg Oral Q6H PRN Zierle-Ghosh, Asia B, DO       Or   ondansetron (ZOFRAN) injection 4 mg  4 mg Intravenous Q6H PRN Zierle-Ghosh, Asia B, DO       pantoprazole (PROTONIX) EC tablet 40 mg  40 mg Oral Daily Zierle-Ghosh, Asia B, DO   40 mg at 01/21/21 0950   thiamine tablet 100 mg  100 mg Oral Daily Zierle-Ghosh, Asia B, DO   100 mg at 01/21/21 1884   Or   thiamine (B-1) injection 100 mg  100 mg Intravenous Daily Zierle-Ghosh, Asia B, DO   100 mg at 01/20/21 1506   topiramate (TOPAMAX) tablet 25 mg  25 mg Oral BID Johnson, Clanford L, MD   25 mg at 01/21/21 2014    Allergies as of 01/15/2021 - Review Complete 01/07/2021  Allergen Reaction Noted   Codeine Shortness Of Breath, Swelling, and Rash    Dilaudid [hydromorphone hcl] Shortness Of Breath and Swelling 12/26/2012   Hydrocodone Shortness Of Breath, Swelling, and Rash 12/26/2012   Morphine and related Shortness Of Breath, Swelling, and Rash 12/26/2012   Oxycodone Shortness Of Breath, Swelling, and Rash 12/26/2012   Penicillins Shortness Of Breath, Swelling, and Other (See Comments)    Acetaminophen  12/26/2012   Ativan [lorazepam] Other (See Comments) 12/20/2016   Strawberry extract Swelling 07/22/2011   Watermelon concentrate Swelling 07/22/2011   Benadryl [diphenhydramine hcl] Palpitations 09/23/2011   Latex Rash     Past  Medical History:  Diagnosis Date   Anxiety    Asthma    Chronic low back pain    Collagen vascular disease (HCC)    COPD (chronic obstructive pulmonary disease) (HCC)    Crohn's disease ([place])    GERD (gastroesophageal reflux disease)    EGD 01/2007 by Dr.Rourke small hiatal hernia s/p 56 french maloney    History  of head injury    Hypertension    IBS (irritable bowel syndrome)    PSVT (paroxysmal supraventricular tachycardia) (HCC)    PTSD (post-traumatic stress disorder)    Recurrent chest pain    Seizure disorder ([place])    Type 2 diabetes mellitus ([place])     Past Surgical History:  Procedure Laterality Date   ABDOMINAL HYSTERECTOMY     with right salpingo oophorectomy 2005   APPENDECTOMY  2006   CESAREAN SECTION     1996   CHOLECYSTECTOMY     COLONOSCOPY  2010   Dr. Gala Romney; negative except for hemorrhoids   ESOPHAGEAL DILATION N/A 10/25/2014   Procedure: ESOPHAGEAL DILATION;  Surgeon: Rogene Houston, MD;  Location: [facility];  Service: Endoscopy;  Laterality: N/A;   ESOPHAGOGASTRODUODENOSCOPY N/A 10/25/2014   Procedure: ESOPHAGOGASTRODUODENOSCOPY (EGD);  Surgeon: Rogene Houston, MD;  Location: [facility];  Service: Endoscopy;  Laterality: N/A;  1250   OOPHORECTOMY     left for torsion and ovarian fibroma; uterine myoma resected 1995    Family History  Problem Relation Age of Onset   Cancer Mother    Heart failure Mother    Hypertension Mother    Heart attack Mother    Heart attack Father 51   Cancer Father 79   Heart failure Father    Diabetes Father    Crohn's disease Cousin     Social History   Socioeconomic History   Marital status: Divorced    Spouse name: Not on file   Number of children: Not on file   Years of education: Not on file   Highest education level: Not on file  Occupational History   Not on file  Tobacco Use   Smoking status: Some Days    Packs/day: 0.50    Years: 20.00    Pack years: 10.00    Types: Cigarettes    Start date:  09/26/1984   Smokeless tobacco: Never  Substance and Sexual Activity   Alcohol use: No    Alcohol/week: 0.0 standard drinks   Drug use: Not Currently   Sexual activity: Yes    Birth control/protection: Surgical  Other Topics Concern   Not on file  Social History Narrative   Not on file   Social Determinants of Health   Financial Resource Strain: Not on file  Food Insecurity: Not on file  Transportation Needs: Not on file  Physical Activity: Not on file  Stress: Not on file  Social Connections: Not on file  Intimate Partner Violence: Not on file     ROS:  General: Negative for anorexia, weight loss, fever, chills, fatigue, weakness. Eyes: Negative for vision changes.  ENT: Negative for hoarseness, difficulty swallowing , nasal congestion. CV: Negative for chest pain, angina, palpitations, dyspnea on exertion, peripheral edema.  Respiratory: Negative for dyspnea at rest, dyspnea on exertion, sputum, wheezing. New cough.  GI: See history of present illness. GU:  Negative for dysuria, hematuria, urinary incontinence, urinary frequency, nocturnal urination.  MS: Negative for joint pain, low back pain.  Derm: Negative for rash or itching.  Neuro: Negative for weakness, abnormal sensation, seizure, frequent headaches, memory loss, confusion.  Psych: Negative for anxiety, depression, suicidal ideation, hallucinations.   Endo: Negative for unusual weight change.  Heme: Negative for bruising or bleeding. Allergy: Negative for rash or hives.       Physical Examination: Vital signs in last 24 hours: Temp:  [97.7 F (36.5 C)-98.6 F (37 C)] 98.6 F (37 C) (  09/22 0522) Pulse Rate:  [88-99] 88 (09/22 0522) Resp:  [18-20] 18 (09/22 0522) BP: (100-128)/(55-101) 107/64 (09/22 0522) SpO2:  [91 %-94 %] 92 % (09/22 0522) Last BM Date: 01/20/21  General: Well-nourished, well-developed in no acute distress.  Head: Normocephalic, atraumatic.   Eyes: Conjunctiva pink, no  icterus. Mouth: Oropharyngeal mucosa moist and pink , no lesions erythema or exudate. Neck: Supple without thyromegaly, masses, or lymphadenopathy.  Lungs: Clear to auscultation bilaterally.  Heart: Regular rate and rhythm, no murmurs rubs or gallops.  Abdomen: Bowel sounds are normal, nontender, nondistended, no hepatosplenomegaly or masses, no abdominal bruits or    hernia , no rebound or guarding.   Rectal: not peformed Extremities: No lower extremity edema, clubbing, deformity.  Neuro: Alert and oriented x 4 , grossly normal neurologically. Flat affect. 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 Reported parents died same day recently, previously reported in 06/30/2009 dad died of pancreatic cancer.  Skin: Warm and dry, no rash or jaundice.   Psych: Alert and cooperative, normal mood and affect.        Intake/Output from previous day: 09/21 0701 - 09/22 0700 In: 312.1 [P.O.:240; IV Piggyback:72.1] Out: -  Intake/Output this shift: No intake/output data recorded.  Lab Results: CBC Recent Labs    01/21/21 0533 01/22/21 0603  WBC 12.6* 18.4*  HGB 11.2* 12.0  HCT 34.2* 38.0  MCV 96.3 95.7  PLT 360 392   BMET Recent Labs    01/21/21 0533 01/22/21 0603  NA 135 135  K 3.1* 3.5  CL 105 105  CO2 22 21*  GLUCOSE 109* 94  BUN 7 6  CREATININE 0.86 0.88  CALCIUM 8.0* 8.2*   LFT Recent Labs    01/21/21 0533 01/22/21 0603  BILITOT 0.1* 0.2*  ALKPHOS 194* 208*  AST 72* 56*  ALT 34 33  PROT 5.1* 6.0*  ALBUMIN 2.4* 2.7*    Lipase No results for input(s): LIPASE in the last 72 hours.  PT/INR No results for input(s): LABPROT, INR in the last 72 hours.    Imaging Studies: CT HEAD WO CONTRAST (5MM)  Result Date: 01/16/2021 CLINICAL DATA:  Status post seizure. EXAM: CT HEAD WITHOUT CONTRAST TECHNIQUE: Contiguous axial images were obtained from the base of the skull through the vertex without intravenous contrast. COMPARISON:  June 03, 2020 FINDINGS: Brain: No evidence of acute infarction, hemorrhage, hydrocephalus, extra-axial collection or mass lesion/mass effect. Vascular: No hyperdense vessel or unexpected calcification. Skull: Normal. Negative for fracture or focal lesion. Sinuses/Orbits: A small right maxillary sinus air-fluid level is seen. Other: None. IMPRESSION: 1. No acute intracranial abnormality. Electronically Signed   By: Virgina Norfolk M.D.   On: 01/16/2021 01:08   CT ABDOMEN PELVIS W CONTRAST  Result Date: 01/21/2021 CLINICAL DATA:  Abdominal pain, acute nonlocalized fever, leukocytosis. Abdominal abscess/infection suspected. COVID positive. History of COPD, collagen vascular disease and Crohn's disease. EXAM: CT ABDOMEN AND PELVIS WITH CONTRAST TECHNIQUE: Multidetector CT imaging of the abdomen and pelvis was performed using the standard protocol following bolus administration of intravenous contrast. CONTRAST:  56m OMNIPAQUE IOHEXOL 350 MG/ML SOLN COMPARISON:  CT 09/14/2015. FINDINGS: Lower chest: New small to moderate dependent pleural effusions with associated bibasilar atelectasis at both lung bases. There is a small pericardial effusion as well. The heart size is normal. No acute vascular findings are evident. Hepatobiliary: The liver is normal in density without suspicious focal abnormality. No significant biliary dilatation status post cholecystectomy.  Pancreas: Unremarkable. No pancreatic ductal dilatation or surrounding inflammatory changes. Spleen: Normal in size without focal abnormality. Adrenals/Urinary Tract: Both adrenal glands appear normal. Both kidneys appear normal. No evidence of urinary tract calculus, hydronephrosis or perinephric soft tissue stranding. The urinary bladder is moderately distended. There is a small amount of air within the bladder lumen. No bladder wall thickening or immediate surrounding inflammatory changes identified. Stomach/Bowel: Enteric contrast was administered and has passed  into the mid small bowel. The stomach appears normal for its degree of distention. There is no significant small bowel distension, wall thickening or surrounding inflammation. By report, the patient is status post appendectomy. The proximal colon is fluid-filled and mildly distended. There is diffuse wall thickening of the colon extending from the splenic flexure to the rectum. Wall thickening and enhancement are most prominent within the proximal rectum. Distally, the rectal walls are less well-defined, and distal rectal perforation is difficult to exclude. There is moderate perirectal soft tissue stranding. These inflammatory changes do not appear to directly abut the posterior wall of the bladder. No discrete fistula are seen. Vascular/Lymphatic: There are no enlarged abdominal or pelvic lymph nodes. No acute vascular findings. The portal, superior mesenteric and splenic veins are patent. Reproductive: Hysterectomy. No adnexal mass. There is no gas within the vagina. Other: Small amount of pelvic ascites. As above, possible perirectal fluid without drainable fluid collection or fistula. No other extraluminal fluid collections are identified. There is no free air. Musculoskeletal: No acute or significant osseous findings. No evidence of discitis or sacroiliitis. IMPRESSION: 1. Findings are consistent with distal colitis, extending from the splenic flexure of the colon into the rectum. This colitis is presumably secondary to the patient's Crohn's disease. 2. The walls of the distal rectum are ill-defined, and rectal perforation difficult to exclude. No drainable fluid collection identified. 3. Gas within the wall of the bladder without bladder wall thickening or immediate surrounding inflammatory changes. The gas could be iatrogenic, although if catheterization has not been recently performed, a colovesical fistula would be a consideration. 4. No evidence of small-bowel inflammation or obstruction. 5. Bilateral  pleural effusions with small pericardial effusion and bibasilar atelectasis. 6. These results will be called to the ordering clinician or representative by the Radiologist Assistant, and communication documented in the PACS or [employer]. Electronically Signed   By: Richardean Sale M.D.   On: 01/21/2021 16:45   US RENAL  Result Date: 01/16/2021 CLINICAL DATA:  Acute kidney injury EXAM: RENAL / URINARY TRACT ULTRASOUND COMPLETE COMPARISON:  None. FINDINGS: Right Kidney: Renal measurements: 10.7 x 5.0 x 5.5 cm = volume: 156 mL. Echogenicity within normal limits. No mass or hydronephrosis visualized. Left Kidney: Renal measurements: 10.3 x 6.0 x 5.3 cm = volume: 172 mL. Echogenicity within normal limits. No mass or hydronephrosis visualized. Bladder: Appears normal for degree of bladder distention. Bilateral ureteral jets are not noted, and the patient is not able to void at the time of the examination. Other: None. IMPRESSION: 1. Unremarkable sonographic appearance of the kidneys. No hydronephrosis. 2. Bilateral ureteral jets are not noted, and the patient is not able to void at the time of the examination. Correlate for urinary retention. Electronically Signed   By: Eddie Candle M.D.   On: 01/16/2021 13:04   US Carotid Bilateral  Result Date: 01/16/2021 CLINICAL DATA:  Altered mental status, seizures EXAM: BILATERAL CAROTID DUPLEX ULTRASOUND TECHNIQUE: Pearline Cables scale imaging, color Doppler and duplex ultrasound were performed of bilateral carotid and vertebral arteries in the neck.  COMPARISON:  None. FINDINGS: Criteria: Quantification of carotid stenosis is based on velocity parameters that correlate the residual internal carotid diameter with NASCET-based stenosis levels, using the diameter of the distal internal carotid lumen as the denominator for stenosis measurement. The following velocity measurements were obtained: RIGHT ICA: 127/43 cm/sec CCA: 299/24 cm/sec SYSTOLIC ICA/CCA RATIO:  0.9 ECA:  118  cm/sec LEFT ICA: 145/41 cm/sec CCA: 268/34 cm/sec SYSTOLIC ICA/CCA RATIO:  1.1 ECA:  107 cm/sec RIGHT CAROTID ARTERY: No significant atherosclerotic plaque or evidence of stenosis in the internal carotid artery. RIGHT VERTEBRAL ARTERY:  Patent with normal antegrade flow. LEFT CAROTID ARTERY: Trace smooth heterogeneous atherosclerotic plaque in the proximal internal carotid artery. By peak systolic velocity criteria in the region of plaque, the estimated stenosis is less than 50%. LEFT VERTEBRAL ARTERY:  Patent with normal antegrade flow. IMPRESSION: 1. Mild (1-49%) stenosis proximal left internal carotid artery secondary to trace smooth heterogeneous atherosclerotic plaque. 2. No significant atherosclerotic plaque or evidence of stenosis in the right internal carotid artery. 3. Vertebral arteries are patent with normal antegrade flow. Signed, Criselda Peaches, MD, [facility] Electronically Signed   By: Jacqulynn Cadet M.D.   On: 01/16/2021 11:00   DG CHEST PORT 1 VIEW  Result Date: 01/17/2021 CLINICAL DATA:  Severe sepsis.  Patient is COVID-19 positive. EXAM: PORTABLE CHEST 1 VIEW COMPARISON:  January 16, 2021 FINDINGS: Heart, hila, mediastinum are normal. No pneumothorax. No nodules or masses. Minimal haziness in the left costophrenic angle, likely atelectasis. No suspicious infiltrates are identified. IMPRESSION: Minimal haziness in the left costophrenic angle is favored to represent atelectasis. No other abnormalities are identified. No suspicious infiltrates. Electronically Signed   By: Dorise Bullion III M.D.   On: 01/17/2021 08:13   DG Chest Port 1 View  Result Date: 01/16/2021 CLINICAL DATA:  Seizure. EXAM: PORTABLE CHEST 1 VIEW COMPARISON:  May 09, 2016 FINDINGS: The heart size and mediastinal contours are within normal limits. Both lungs are clear. The visualized skeletal structures are unremarkable. IMPRESSION: No active  disease. Electronically Signed   By: Virgina Norfolk M.D.   On: 01/16/2021 01:02   DG Foot 2 Views Right  Result Date: 01/19/2021 CLINICAL DATA:  Recent fall with bruising EXAM: RIGHT FOOT - 2 VIEW COMPARISON:  None. FINDINGS: There is no evidence of fracture or dislocation. There is no evidence of arthropathy or other focal bone abnormality. Soft tissues are unremarkable. IMPRESSION: Negative. Electronically Signed   By: Jorje Guild M.D.   On: 01/19/2021 11:14   EEG adult  Result Date: 01/16/2021 Lora Havens, MD     01/16/2021 10:16 PM Patient Name: Alyn Riedinger MRN: [id number] Epilepsy Attending: Lora Havens Referring Physician/Provider: Dr Clearence Ped, [place] B Date: 01/16/2021 Duration: 22.32 mins Patient history: 50 yo F with AMS and metabolic changes, Likely due to xanax withdrawal seizures. EEG to evaluate for seizure Level of alertness: Awake AEDs during EEG study: LEV, GBP Technical aspects: This EEG study was done with scalp electrodes positioned according to the 10-20 International system of electrode placement. Electrical activity was acquired at a sampling rate of 500Hz  and reviewed with a high frequency filter of 70Hz  and a low frequency filter of 1Hz . EEG data were recorded continuously and digitally stored. Description: The posterior dominant rhythm consists of 9 Hz activity of moderate voltage (25-35 uV) seen predominantly in posterior head regions, symmetric and reactive to eye opening and eye closing. EEG showed intermittent generalized 5 to 6  Hz theta slowing. Hyperventilation and photic stimulation were not performed.   ABNORMALITY - Intermittent slow, generalized IMPRESSION: This study is suggestive of mild diffuse encephalopathy, nonspecific etiology. No seizures or epileptiform discharges were seen throughout the recording. Lora Havens   ECHOCARDIOGRAM COMPLETE  Result Date: 01/16/2021    ECHOCARDIOGRAM REPORT   Patient Name:   KORYN CHARLOT Date of Exam:  01/16/2021 Medical Rec #:  [id number]     Height:       66.0 in Accession #:    [id number]    Weight:       145.0 lb Date of Birth:  January 31, 1971     BSA:          1.744 m Patient Age:    1 years      BP:           81/20 mmHg Patient Gender: F             HR:           111 bpm. Exam Location:  [name] Procedure: 2D Echo, Cardiac Doppler and Color Doppler Indications:    Syncope  History:        Patient has prior history of Echocardiogram examinations, most                 recent 07/11/2010. COPD, Signs/Symptoms:Dyspnea; Risk                 Factors:Tobacco abuse and Diabetes. COVID +.  Sonographer:    Wenda Low Referring Phys: ASIA B Mathews  1. Left ventricular ejection fraction, by estimation, is 70 to 75%. The left ventricle has hyperdynamic function. The left ventricle has no regional wall motion abnormalities. There is mild left ventricular hypertrophy. Left ventricular diastolic parameters are indeterminate.  2. Right ventricular systolic function is normal. The right ventricular size is normal. Tricuspid regurgitation signal is inadequate for assessing PA pressure.  3. No right atrial or right ventricular collapse to suggest tamponade physiology. Respiratory variation in mitral outflow not assessed. Presence of sinus tachycardia does raise concern however for potential hemodynamic significance. Follow-up clinically  and suggest repeat limited echocardiogram within tht next 48 hours. Moderate pericardial effusion. The pericardial effusion is circumferential.  4. The mitral valve is grossly normal. Mild mitral valve regurgitation.  5. The aortic valve is tricuspid. Aortic valve regurgitation is not visualized. No aortic stenosis is present. Aortic valve mean gradient measures 6.0 mmHg.  6. The inferior vena cava is normal in size with <50% respiratory variability, suggesting right atrial pressure of 8 mmHg. Comparison(s): No prior Echocardiogram. FINDINGS  Left Ventricle: Left ventricular  ejection fraction, by estimation, is 70 to 75%. The left ventricle has hyperdynamic function. The left ventricle has no regional wall motion abnormalities. The left ventricular internal cavity size was normal in size. There is mild left ventricular hypertrophy. Left ventricular diastolic parameters are indeterminate. Right Ventricle: The right ventricular size is normal. No increase in right ventricular wall thickness. Right ventricular systolic function is normal. Tricuspid regurgitation signal is inadequate for assessing PA pressure. Left Atrium: Left atrial size was normal in size. Right Atrium: Right atrial size was normal in size. Pericardium: No right atrial or right ventricular collapse to suggest tamponade physiology. Respiratory variation in mitral outflow not assessed. Presence of sinus tachycardia does raise concern however for potential hemodynamic significance. Follow-up clinically and suggest repeat limited echocardiogram within tht next 48 hours. A moderately sized pericardial effusion is present. The pericardial effusion is circumferential.  Mitral Valve: The mitral valve is grossly normal. Mild mitral valve regurgitation. MV peak gradient, 13.1 mmHg. The mean mitral valve gradient is 6.0 mmHg. Tricuspid Valve: The tricuspid valve is grossly normal. Tricuspid valve regurgitation is trivial. Aortic Valve: The aortic valve is tricuspid. Aortic valve regurgitation is not visualized. No aortic stenosis is present. Aortic valve mean gradient measures 6.0 mmHg. Aortic valve peak gradient measures 10.8 mmHg. Aortic valve area, by VTI measures 2.42  cm. Pulmonic Valve: The pulmonic valve was grossly normal. Pulmonic valve regurgitation is trivial. Aorta: The aortic root is normal in size and structure. Venous: The inferior vena cava is normal in size with less than 50% respiratory variability, suggesting right atrial pressure of 8 mmHg. IAS/Shunts: No atrial level shunt detected by color flow Doppler.  LEFT  VENTRICLE PLAX 2D LVIDd:         3.70 cm  Diastology LVIDs:         2.40 cm  LV e' medial:    9.17 cm/s LV PW:         1.10 cm  LV E/e' medial:  11.2 LV IVS:        1.10 cm  LV e' lateral:   9.17 cm/s LVOT diam:     2.00 cm  LV E/e' lateral: 11.2 LV SV:         58 LV SV Index:   33 LVOT Area:     3.14 cm  RIGHT VENTRICLE RV Basal diam:  2.30 cm RV Mid diam:    2.00 cm RV S prime:     27.70 cm/s TAPSE (M-mode): 3.3 cm LEFT ATRIUM             Index       RIGHT ATRIUM           Index LA diam:        3.30 cm 1.89 cm/m  RA Area:     10.80 cm LA Vol (A2C):   41.8 ml 23.96 ml/m RA Volume:   17.70 ml  10.15 ml/m LA Vol (A4C):   34.7 ml 19.89 ml/m LA Biplane Vol: 38.5 ml 22.07 ml/m  AORTIC VALVE AV Area (Vmax):    2.47 cm AV Area (Vmean):   2.20 cm AV Area (VTI):     2.42 cm AV Vmax:           164.00 cm/s AV Vmean:          109.000 cm/s AV VTI:            0.240 m AV Peak Grad:      10.8 mmHg AV Mean Grad:      6.0 mmHg LVOT Vmax:         129.00 cm/s LVOT Vmean:        76.400 cm/s LVOT VTI:          0.185 m LVOT/AV VTI ratio: 0.77  AORTA Ao Root diam: 3.20 cm MITRAL VALVE MV Area (PHT): 5.02 cm     SHUNTS MV Area VTI:   1.73 cm     Systemic VTI:  0.18 m MV Peak grad:  13.1 mmHg    Systemic Diam: 2.00 cm MV Mean grad:  6.0 mmHg MV Vmax:       1.81 m/s MV Vmean:      109.0 cm/s MV Decel Time: 151 msec MV E velocity: 103.00 cm/s MV A velocity: 168.00 cm/s MV E/A ratio:  0.61 Rozann Lesches MD Electronically signed by Rozann Lesches MD Signature  Date/Time: 01/16/2021/4:08:15 PM    Final    ECHOCARDIOGRAM LIMITED  Result Date: 01/19/2021    ECHOCARDIOGRAM LIMITED REPORT   Patient Name:   JERLISA DILIBERTO Date of Exam: 01/19/2021 Medical Rec #:  [id number]     Height:       66.0 in Accession #:    [id number]    Weight:       161.6 lb Date of Birth:  [birth year]     BSA:          1.827 m Patient Age:    59 years      BP:           99/68 mmHg Patient Gender: F             HR:           96 bpm. Exam Location:  [name]  Procedure: Limited Echo Indications:    Pericardial effusion  History:        Patient has prior history of Echocardiogram examinations, most                 recent 01/16/2021. COPD, Arrythmias:PVC, Signs/Symptoms:Chest                 Pain; Risk Factors:Diabetes. COVID +, Tobacco Abuse, Sepsis,                 Pericardial effusion.  Sonographer:    Wenda Low Referring Phys: [place]  1. Left ventricular ejection fraction, by estimation, is 60 to 65%. The left ventricle has normal function.  2. Moderate pericardial effusion. The pericardial effusion is circumferential. There is no evidence of cardiac tamponade. Effusion looks larger compared to the 01/16/2021 study however remains overall moderate by measurements. Recommend repeating limited  study in 72 hours. FINDINGS  Left Ventricle: Left ventricular ejection fraction, by estimation, is 60 to 65%. The left ventricle has normal function. Pericardium: A moderately sized pericardial effusion is present. The pericardial effusion is circumferential. There is no evidence of cardiac tamponade. LEFT VENTRICLE PLAX 2D LVIDd:         3.90 cm LVIDs:         2.40 cm LV PW:         1.00 cm LV IVS:        1.00 cm LVOT diam:     2.00 cm LVOT Area:     3.14 cm  LEFT ATRIUM         Index LA diam:    3.40 cm 1.86 cm/m   SHUNTS Systemic Diam: 2.00 cm Carlyle Dolly MD Electronically signed by Carlyle Dolly MD Signature Date/Time: 01/19/2021/11:20:45 AM    Final    [place] EKG SITE RITE  Result Date: 01/16/2021 If Site Rite image not attached, placement could not be confirmed due to current cardiac rhythm. Minnie.Brome week]   Impression: 50 year old female with history of seizures, GERD, IBS, type 2 diabetes mellitus, COPD presenting to the ED for seizures.  Patient has a difficult historian.  Her history does not match objective findings.  Presented profoundly dehydrated with creatinine of over 5.  Total CK elevated just under 3000.  Urine drug screen  positive for THC, negative for benzos but she reports taking Xanax 3 times daily.  Incidentally tested positive for COVID.  GI consulted for colitis.  Diarrhea: Patient reports diarrhea since admission.  She has a chronic history of intermittent diarrhea and felt to have IBS.  She reports being diagnosed with Crohn's at  [facility] in 1998, those records are not available.  Subsequent numerous colonoscopies with no evidence of active Crohn's.  Small bowel follow-through 2013 with no active Crohn's disease.  This admission CT with left-sided colitis more pronounced in the rectum.  Gas noted within the wall of the bladder but no bladder wall thickening.  She did have Foley catheter early in the admission.  Denies any symptoms to suggest colovesical fistula.  Today she states her diarrhea has improved dramatically over the last 24 to 48 hours.  Suspect symptoms likely infectious rather than IBD. Could be due to Covid. She denies any abdominal pain or rectal pain.  Stool studies have not been collected because of lack of BM.  Currently on IV Cipro and Flagyl.  White blood cell count has spiked to 18,400, unclear etiology.  Plan: Consider colonoscopy after patient gets over acute illness.  Can be done as an outpatient.  She prefers colonoscopy by her primary gastroenterologist, Dr. Laural Golden.  If further diarrhea, collect stool studies. Complete 7 to 10-day course of antibiotics. If she develops rectal pain, abdominal pain she may need repeat imaging to reassess rectal findings on current CT.  We would like to thank you for the opportunity to participate in the care of [place].  Laureen Ochs. Bernarda Caffey [facility] [phone] 9/22/20229:34 AM     LOS: 6 days

## 2021-01-22 NOTE — Progress Notes (Signed)
PROGRESS NOTE  [name] DTO:[id number] DOB: 11/01/1970 DOA: 01/16/2021 PCP: Teodoro Kil, PA-C  Brief History:   50 y.o. female, with history of COPD, Corhn's, GERD, HTN, T2DM, and more presents to the ED with a chief complaint of seizure.  It is unclear how accurate patient's history is.  She reports that she was sitting on the toilet, attempting to void but could not start a stream, when she had a seizure.  When asked how she knew it was a seizure she reports it is because she has had seizures before.  Patient reports that she is supposed to be on seizure medication but she does not know the moderate the dose.  She reports that she remembers the whole thing.  There was no rhythmic jerking or twitching.  She reports she just knows it was a seizure.  Patient reports that she did not lose consciousness and remembers the whole thing.  Somehow, a "stranger" found her and brought her to the hospital per her report.  I discussed with the patient that her labs would indicate that she was laying on the ground for some time, and asked if she had lost time.  Patient reports that she hit the ground several times yesterday because she found out that her mother and father died at the same time in 2 different locations.  She was noted to have severe dehydration, rhabdomyolysis and AKI with SIRS of unknown source.       Assessment/Plan: Sepsis Due to colitis--POA -presented with leukocytosis, tachycardia, AMS  -CT abdomen and pelvis shows distal colitis, extending from the splenic flexure of the colon into the rectum. The walls of the distal rectum are ill-defined, and rectal perforation difficult to exclude. No drainable fluid collectionidentified. -Presented with markedly elevated procalcitonin level and elevated lactic acid level was empirically started on IV antibiotics broad-spectrum at time of admission -WBC trending up-continue Cipro and Flagyl -blood cultures neg -CXR--neg for  consolidation -UA no pyuria  Abdominal pain/Loose Stools>>Colitis -01/21/21 CT abd/pelvis--distal colitis, extending from the splenic flexure of the colon into the rectum;  walls of the distal rectum are ill-defined, and rectal perforation difficult to exclude. No drainable fluid collection identified. -review of medical records showed pt reports dx of Crohn's diagnosed 7 at [facility] but numerous studies including colonoscopy (05/24/2008) which showed normal terminal ileum and SBFT (07/26/2011) which showed normal SB have questioned if this was a true diagnosis -Given initial clinical improvement on IV abx and questionable Crohn's diagnosis>>restart abx (cipro/flagyl) GI consult appreciated recommends continue antibiotics for about a week or so -Awaiting stool pathogen panel -Awaiting stool calprotectin -Lactic acid improved to 2.0 from 4.8   Acute metabolic encephalopathy - -Likely secondary to postictal state from withdrawal seizure and AKI --overall improved with supportive measures -01/21/21--significant other at bedside states pt is near baseline -CT did not show any acute abnormalities. -EEG unrevealing epileptiform activity noted -d/c Elavil and Atarax -UDS-->positive THC -01/16/21 VBG--no hypercarbia -check B12--701 -folate--17.9 -TSH--2.063 -ammonia--22 -9/16 UA--no pyuria   Xanax withdrawal seizure -appreciate neurology consult -PDMP reviewed--pt receives xanax 1 mg, #90 monthly -restarted xanax -she apparently ran out 3-4 days prior to admission and significant other found her lethargic on floor -Avoid abrupt discontinuation of benzos    AKI -serum creatinine peaked 5.36 -baseline creatinine 0.8-0.9 -improved with IVF -Appreciate nephrology consultation and recommendations   COVID infection -currently asymptomatic-following with supportive measures -stable on RA   Moderate pericardial effusion  -  repeat Echo done 9/19 slightly larger effusion noted - Dr.  Harl Bowie recommended repeating limited Echo in 72 hours -repeat echo 01/22/21  Nontraumatic Rhabdomyolysis -CK 2980>>49 -improved with IVF   Right foot pain  - soft tissue injury only, xray images with no finding of fracture   Hypomagnesemia -replete      Status is: Inpatient   Remains inpatient appropriate because:Inpatient level of care appropriate due to severity of illness   Dispo: The patient is from: Home              Anticipated d/c is to: Home              Patient currently is not medically stable to d/c.              Difficult to place patient No               Family Communication:   significant other updated at bedside 9/22  Consultants:  renal, neurology   Code Status:  FULL    DVT Prophylaxis:  [place] Heparin      Procedures: As Listed in Progress Note Above   Antibiotics: None  Subjective: -Abdominal pain improving, tolerating oral intake -2 very small volume nonbloody BM No fever  Or chills  -Significant other at bedside, questions answered  Objective: Vitals:   01/21/21 1225 01/21/21 2137 01/22/21 0522 01/22/21 1208  BP: (!) 128/101 (!) 100/55 107/64 110/65  Pulse: 99 95 88 76  Resp: 20 19 18 18   Temp: 97.7 F (36.5 C) 98.6 F (37 C) 98.6 F (37 C) 97.7 F (36.5 C)  TempSrc: Oral Oral Oral Oral  SpO2: 94% 91% 92% 93%  Weight:      Height:        Intake/Output Summary (Last 24 hours) at 01/22/2021 1705 Last data filed at 01/22/2021 1300 Gross per 24 hour  Intake 521.64 ml  Output --  Net 521.64 ml   Weight change:  Exam:  General:  Pt is alert, follows commands appropriately, not in acute distress HEENT: No icterus, No thrush, No neck mass, [place]/AT Cardiovascular: RRR, S1/S2, no rubs, no gallops Respiratory: CTA bilaterally, no wheezing, no crackles, no rhonchi Abdomen: Soft/+BS, non tender, non distended, no guarding Extremities: No edema, No lymphangitis, No petechiae, No rashes, no synovitis   Data Reviewed: I have personally  reviewed following labs and imaging studies Basic Metabolic Panel: Recent Labs  Lab 01/16/21 0823 01/17/21 0444 01/18/21 0308 01/19/21 0620 01/21/21 0533 01/22/21 0603  NA 134* 134* 134* 133* 135 135  K 3.2* 4.6 3.7 3.3* 3.1* 3.5  CL 104 103 105 101 105 105  CO2 16* 20* 24 26 22  21*  GLUCOSE 125* 118* 103* 96 109* 94  BUN 34* 15 10 9 7 6   CREATININE 3.53* 1.27* 0.85 0.96 0.86 0.88  CALCIUM 7.5* 8.5* 7.8* 8.2* 8.0* 8.2*  MG  --  1.8 1.6* 2.0 1.7 2.3  PHOS 3.5  --   --   --   --   --    Liver Function Tests: Recent Labs  Lab 01/16/21 0041 01/16/21 0823 01/17/21 0444 01/18/21 0308 01/21/21 0533 01/22/21 0603  AST 79*  --  67* 42* 72* 56*  ALT 38  --  32 23 34 33  ALKPHOS 195*  --  142* 93 194* 208*  BILITOT 0.4  --  0.6 0.6 0.1* 0.2*  PROT 6.9  --  5.6* 4.4* 5.1* 6.0*  ALBUMIN 3.5 3.0* 2.7* 2.1* 2.4* 2.7*  No results for input(s): LIPASE, AMYLASE in the last 168 hours. Recent Labs  Lab 01/21/21 0533  AMMONIA 22   Coagulation Profile: No results for input(s): INR, PROTIME in the last 168 hours. CBC: Recent Labs  Lab 01/16/21 0041 01/17/21 0444 01/18/21 0308 01/21/21 0533 01/22/21 0603  WBC 13.5* 10.7* 9.5 12.6* 18.4*  NEUTROABS  --  8.8* 7.9*  --  13.1*  HGB 13.9 12.6 10.9* 11.2* 12.0  HCT 43.4 37.9 33.2* 34.2* 38.0  MCV 95.8 92.9 94.1 96.3 95.7  PLT 319 277 222 360 392   Cardiac Enzymes: Recent Labs  Lab 01/16/21 0041 01/17/21 0444 01/18/21 0308 01/21/21 0533  CKTOTAL 2,980* 1,451* 313* 49   BNP: Invalid input(s): POCBNP CBG: No results for input(s): GLUCAP in the last 168 hours. HbA1C: No results for input(s): HGBA1C in the last 72 hours. Urine analysis:    Component Value Date/Time   COLORURINE YELLOW 01/16/2021 0256   APPEARANCEUR HAZY (A) 01/16/2021 0256   LABSPEC 1.008 01/16/2021 0256   PHURINE 6.0 01/16/2021 0256   GLUCOSEU NEGATIVE 01/16/2021 0256   GLUCOSEU NEG mg/dL 12/24/2009 2317   HGBUR LARGE (A) 01/16/2021 0256    BILIRUBINUR NEGATIVE 01/16/2021 0256   KETONESUR NEGATIVE 01/16/2021 0256   PROTEINUR 30 (A) 01/16/2021 0256   UROBILINOGEN 0.2 12/26/2013 1530   NITRITE NEGATIVE 01/16/2021 0256   LEUKOCYTESUR NEGATIVE 01/16/2021 0256   Sepsis Labs: @LABRCNTIP (procalcitonin:4,lacticidven:4) ) Recent Results (from the past 240 hour(s))  Resp Panel by RT-PCR (Flu A&B, Covid) Nasopharyngeal Swab     Status: Abnormal   Collection Time: 01/16/21  2:32 AM   Specimen: Nasopharyngeal Swab; Nasopharyngeal(NP) swabs in vial transport medium  Result Value Ref Range Status   SARS Coronavirus 2 by RT PCR POSITIVE (A) NEGATIVE Final    Comment: RESULT CALLED TO, READ BACK BY AND VERIFIED WITH: HEARN,J@[id number]  by Matthews,b 9.16.22 (NOTE) SARS-CoV-2 target nucleic acids are DETECTED.  The SARS-CoV-2 RNA is generally detectable in upper respiratory specimens during the acute phase of infection. Positive results are indicative of the presence of the identified virus, but do not rule out bacterial infection or co-infection with other pathogens not detected by the test. Clinical correlation with patient history and other diagnostic information is necessary to determine patient infection status. The expected result is Negative.  Fact Sheet for Patients: EntrepreneurPulse.com.au  Fact Sheet for Healthcare Providers: IncredibleEmployment.be  This test is not yet approved or cleared by the [place] FDA and  has been authorized for detection and/or diagnosis of SARS-CoV-2 by FDA under an Emergency Use Authorization (EUA).  This EUA will remain in effect (meaning this test can b e used) for the duration of  the COVID-19 declaration under Section 564(b)(1) of the Act, 21 U.S.C. section 360bbb-3(b)(1), unless the authorization is terminated or revoked sooner.     Influenza A by PCR NEGATIVE NEGATIVE Final   Influenza B by PCR NEGATIVE NEGATIVE Final    Comment: (NOTE) The  Xpert Xpress SARS-CoV-2/FLU/RSV plus assay is intended as an aid in the diagnosis of influenza from Nasopharyngeal swab specimens and should not be used as a sole basis for treatment. Nasal washings and aspirates are unacceptable for Xpert Xpress SARS-CoV-2/FLU/RSV testing.  Fact Sheet for Patients: EntrepreneurPulse.com.au  Fact Sheet for Healthcare Providers: IncredibleEmployment.be  This test is not yet approved or cleared by the [place] FDA and has been authorized for detection and/or diagnosis of SARS-CoV-2 by FDA under an Emergency Use Authorization (EUA). This EUA will remain in effect (meaning this  test can be used) for the duration of the COVID-19 declaration under Section 564(b)(1) of the Act, 21 U.S.C. section 360bbb-3(b)(1), unless the authorization is terminated or revoked.  Performed at [facility], [address], [place], [place] [address]   MRSA Next Gen by PCR, Nasal     Status: None   Collection Time: 01/16/21  5:11 AM   Specimen: Nasal Mucosa; Nasal Swab  Result Value Ref Range Status   MRSA by PCR Next Gen NOT DETECTED NOT DETECTED Final    Comment: (NOTE) The GeneXpert MRSA Assay (FDA approved for NASAL specimens only), is one component of a comprehensive MRSA colonization surveillance program. It is not intended to diagnose MRSA infection nor to guide or monitor treatment for MRSA infections. Test performance is not FDA approved in patients less than 70 years old. Performed at [facility], [address], [place], [place] [address]   Culture, blood (routine x 2)     Status: None   Collection Time: 01/16/21  8:24 AM   Specimen: BLOOD RIGHT ARM  Result Value Ref Range Status   Specimen Description   Final    BLOOD RIGHT ARM BOTTLES DRAWN AEROBIC AND ANAEROBIC   Special Requests Blood Culture adequate volume  Final   Culture   Final    NO GROWTH 5 DAYS Performed at [facility], [address] [place],   [address]    Report Status 01/21/2021 FINAL  Final     Scheduled Meds:  cycloSPORINE  1 drop Both Eyes BID   DULoxetine  30 mg Oral Daily   ezetimibe  10 mg Oral Daily   folic acid  1 mg Oral Daily   gabapentin  300 mg Oral TID   heparin  5,000 Units Subcutaneous Q8H   levETIRAcetam  750 mg Oral BID   multivitamin with minerals  1 tablet Oral Daily   nicotine  14 mg Transdermal Daily   pantoprazole  40 mg Oral Daily   thiamine  100 mg Oral Daily   Or   thiamine  100 mg Intravenous Daily   topiramate  25 mg Oral BID   Continuous Infusions:  ciprofloxacin 400 mg (01/22/21 1659)   lactated ringers 10 mL/hr at 01/19/21 1604   metronidazole 500 mg (01/22/21 1459)    Procedures/Studies: CT HEAD WO CONTRAST (5MM)  Result Date: 01/16/2021 CLINICAL DATA:  Status post seizure. EXAM: CT HEAD WITHOUT CONTRAST TECHNIQUE: Contiguous axial images were obtained from the base of the skull through the vertex without intravenous contrast. COMPARISON:  June 03, 2020 FINDINGS: Brain: No evidence of acute infarction, hemorrhage, hydrocephalus, extra-axial collection or mass lesion/mass effect. Vascular: No hyperdense vessel or unexpected calcification. Skull: Normal. Negative for fracture or focal lesion. Sinuses/Orbits: A small right maxillary sinus air-fluid level is seen. Other: None. IMPRESSION: 1. No acute intracranial abnormality. Electronically Signed   By: Virgina Norfolk M.D.   On: 01/16/2021 01:08   CT ABDOMEN PELVIS W CONTRAST  Result Date: 01/21/2021 CLINICAL DATA:  Abdominal pain, acute nonlocalized fever, leukocytosis. Abdominal abscess/infection suspected. COVID positive. History of COPD, collagen vascular disease and Crohn's disease. EXAM: CT ABDOMEN AND PELVIS WITH CONTRAST TECHNIQUE: Multidetector CT imaging of the abdomen and pelvis was performed using the standard protocol following bolus administration of intravenous contrast. CONTRAST:  78m OMNIPAQUE IOHEXOL 350 MG/ML SOLN  COMPARISON:  CT 09/14/2015. FINDINGS: Lower chest: New small to moderate dependent pleural effusions with associated bibasilar atelectasis at both lung bases. There is a small pericardial effusion as well.  The heart size is normal. No acute vascular findings are evident. Hepatobiliary: The liver is normal in density without suspicious focal abnormality. No significant biliary dilatation status post cholecystectomy. Pancreas: Unremarkable. No pancreatic ductal dilatation or surrounding inflammatory changes. Spleen: Normal in size without focal abnormality. Adrenals/Urinary Tract: Both adrenal glands appear normal. Both kidneys appear normal. No evidence of urinary tract calculus, hydronephrosis or perinephric soft tissue stranding. The urinary bladder is moderately distended. There is a small amount of air within the bladder lumen. No bladder wall thickening or immediate surrounding inflammatory changes identified. Stomach/Bowel: Enteric contrast was administered and has passed into the mid small bowel. The stomach appears normal for its degree of distention. There is no significant small bowel distension, wall thickening or surrounding inflammation. By report, the patient is status post appendectomy. The proximal colon is fluid-filled and mildly distended. There is diffuse wall thickening of the colon extending from the splenic flexure to the rectum. Wall thickening and enhancement are most prominent within the proximal rectum. Distally, the rectal walls are less well-defined, and distal rectal perforation is difficult to exclude. There is moderate perirectal soft tissue stranding. These inflammatory changes do not appear to directly abut the posterior wall of the bladder. No discrete fistula are seen. Vascular/Lymphatic: There are no enlarged abdominal or pelvic lymph nodes. No acute vascular findings. The portal, superior mesenteric and splenic veins are patent. Reproductive: Hysterectomy. No adnexal mass. There  is no gas within the vagina. Other: Small amount of pelvic ascites. As above, possible perirectal fluid without drainable fluid collection or fistula. No other extraluminal fluid collections are identified. There is no free air. Musculoskeletal: No acute or significant osseous findings. No evidence of discitis or sacroiliitis. IMPRESSION: 1. Findings are consistent with distal colitis, extending from the splenic flexure of the colon into the rectum. This colitis is presumably secondary to the patient's Crohn's disease. 2. The walls of the distal rectum are ill-defined, and rectal perforation difficult to exclude. No drainable fluid collection identified. 3. Gas within the wall of the bladder without bladder wall thickening or immediate surrounding inflammatory changes. The gas could be iatrogenic, although if catheterization has not been recently performed, a colovesical fistula would be a consideration. 4. No evidence of small-bowel inflammation or obstruction. 5. Bilateral pleural effusions with small pericardial effusion and bibasilar atelectasis. 6. These results will be called to the ordering clinician or representative by the Radiologist Assistant, and communication documented in the PACS or [employer]. Electronically Signed   By: Richardean Sale M.D.   On: 01/21/2021 16:45   US RENAL  Result Date: 01/16/2021 CLINICAL DATA:  Acute kidney injury EXAM: RENAL / URINARY TRACT ULTRASOUND COMPLETE COMPARISON:  None. FINDINGS: Right Kidney: Renal measurements: 10.7 x 5.0 x 5.5 cm = volume: 156 mL. Echogenicity within normal limits. No mass or hydronephrosis visualized. Left Kidney: Renal measurements: 10.3 x 6.0 x 5.3 cm = volume: 172 mL. Echogenicity within normal limits. No mass or hydronephrosis visualized. Bladder: Appears normal for degree of bladder distention. Bilateral ureteral jets are not noted, and the patient is not able to void at the time of the examination. Other: None. IMPRESSION: 1.  Unremarkable sonographic appearance of the kidneys. No hydronephrosis. 2. Bilateral ureteral jets are not noted, and the patient is not able to void at the time of the examination. Correlate for urinary retention. Electronically Signed   By: Eddie Candle M.D.   On: 01/16/2021 13:04   US Carotid Bilateral  Result Date: 01/16/2021 CLINICAL DATA:  Altered mental status, seizures EXAM: BILATERAL CAROTID DUPLEX ULTRASOUND TECHNIQUE: Pearline Cables scale imaging, color Doppler and duplex ultrasound were performed of bilateral carotid and vertebral arteries in the neck. COMPARISON:  None. FINDINGS: Criteria: Quantification of carotid stenosis is based on velocity parameters that correlate the residual internal carotid diameter with NASCET-based stenosis levels, using the diameter of the distal internal carotid lumen as the denominator for stenosis measurement. The following velocity measurements were obtained: RIGHT ICA: 127/43 cm/sec CCA: 569/79 cm/sec SYSTOLIC ICA/CCA RATIO:  0.9 ECA:  118 cm/sec LEFT ICA: 145/41 cm/sec CCA: 480/16 cm/sec SYSTOLIC ICA/CCA RATIO:  1.1 ECA:  107 cm/sec RIGHT CAROTID ARTERY: No significant atherosclerotic plaque or evidence of stenosis in the internal carotid artery. RIGHT VERTEBRAL ARTERY:  Patent with normal antegrade flow. LEFT CAROTID ARTERY: Trace smooth heterogeneous atherosclerotic plaque in the proximal internal carotid artery. By peak systolic velocity criteria in the region of plaque, the estimated stenosis is less than 50%. LEFT VERTEBRAL ARTERY:  Patent with normal antegrade flow. IMPRESSION: 1. Mild (1-49%) stenosis proximal left internal carotid artery secondary to trace smooth heterogeneous atherosclerotic plaque. 2. No significant atherosclerotic plaque or evidence of stenosis in the right internal carotid artery. 3. Vertebral arteries are patent with normal antegrade flow. Signed, Criselda Peaches, MD, [facility]  Electronically Signed   By: Jacqulynn Cadet M.D.   On: 01/16/2021 11:00   DG CHEST PORT 1 VIEW  Result Date: 01/17/2021 CLINICAL DATA:  Severe sepsis.  Patient is COVID-19 positive. EXAM: PORTABLE CHEST 1 VIEW COMPARISON:  January 16, 2021 FINDINGS: Heart, hila, mediastinum are normal. No pneumothorax. No nodules or masses. Minimal haziness in the left costophrenic angle, likely atelectasis. No suspicious infiltrates are identified. IMPRESSION: Minimal haziness in the left costophrenic angle is favored to represent atelectasis. No other abnormalities are identified. No suspicious infiltrates. Electronically Signed   By: Dorise Bullion III M.D.   On: 01/17/2021 08:13   DG Chest Port 1 View  Result Date: 01/16/2021 CLINICAL DATA:  Seizure. EXAM: PORTABLE CHEST 1 VIEW COMPARISON:  May 09, 2016 FINDINGS: The heart size and mediastinal contours are within normal limits. Both lungs are clear. The visualized skeletal structures are unremarkable. IMPRESSION: No active disease. Electronically Signed   By: Virgina Norfolk M.D.   On: 01/16/2021 01:02   DG Foot 2 Views Right  Result Date: 01/19/2021 CLINICAL DATA:  Recent fall with bruising EXAM: RIGHT FOOT - 2 VIEW COMPARISON:  None. FINDINGS: There is no evidence of fracture or dislocation. There is no evidence of arthropathy or other focal bone abnormality. Soft tissues are unremarkable. IMPRESSION: Negative. Electronically Signed   By: Jorje Guild M.D.   On: 01/19/2021 11:14   EEG adult  Result Date: 01/16/2021 Lora Havens, MD     01/16/2021 10:16 PM Patient Name: Sanoe Hazan MRN: [id number] Epilepsy Attending: Lora Havens Referring Physician/Provider: Dr Clearence Ped, [place] B Date: 01/16/2021 Duration: 22.32 mins Patient history: 50 yo F with AMS and metabolic changes, Likely due to xanax withdrawal seizures. EEG to evaluate for seizure Level of alertness: Awake AEDs during EEG study: LEV, GBP Technical aspects: This EEG study was  done with scalp electrodes positioned according to the 10-20 International system of electrode placement. Electrical activity was acquired at a sampling rate of 500Hz  and reviewed with a high frequency filter of 70Hz  and a low frequency filter of 1Hz . EEG data were recorded continuously and digitally stored. Description: The posterior dominant rhythm consists of 9  Hz activity of moderate voltage (25-35 uV) seen predominantly in posterior head regions, symmetric and reactive to eye opening and eye closing. EEG showed intermittent generalized 5 to 6 Hz theta slowing. Hyperventilation and photic stimulation were not performed.   ABNORMALITY - Intermittent slow, generalized IMPRESSION: This study is suggestive of mild diffuse encephalopathy, nonspecific etiology. No seizures or epileptiform discharges were seen throughout the recording. Lora Havens   ECHOCARDIOGRAM COMPLETE  Result Date: 01/16/2021    ECHOCARDIOGRAM REPORT   Patient Name:   DONYA HITCH Date of Exam: 01/16/2021 Medical Rec #:  [id number]     Height:       66.0 in Accession #:    [id number]    Weight:       145.0 lb Date of Birth:  02-10-1971     BSA:          1.744 m Patient Age:    28 years      BP:           81/20 mmHg Patient Gender: F             HR:           111 bpm. Exam Location:  [name] Procedure: 2D Echo, Cardiac Doppler and Color Doppler Indications:    Syncope  History:        Patient has prior history of Echocardiogram examinations, most                 recent 07/11/2010. COPD, Signs/Symptoms:Dyspnea; Risk                 Factors:Tobacco abuse and Diabetes. COVID +.  Sonographer:    Wenda Low Referring Phys: ASIA B [place]  1. Left ventricular ejection fraction, by estimation, is 70 to 75%. The left ventricle has hyperdynamic function. The left ventricle has no regional wall motion abnormalities. There is mild left ventricular hypertrophy. Left ventricular diastolic parameters are indeterminate.  2. Right  ventricular systolic function is normal. The right ventricular size is normal. Tricuspid regurgitation signal is inadequate for assessing PA pressure.  3. No right atrial or right ventricular collapse to suggest tamponade physiology. Respiratory variation in mitral outflow not assessed. Presence of sinus tachycardia does raise concern however for potential hemodynamic significance. Follow-up clinically  and suggest repeat limited echocardiogram within tht next 48 hours. Moderate pericardial effusion. The pericardial effusion is circumferential.  4. The mitral valve is grossly normal. Mild mitral valve regurgitation.  5. The aortic valve is tricuspid. Aortic valve regurgitation is not visualized. No aortic stenosis is present. Aortic valve mean gradient measures 6.0 mmHg.  6. The inferior vena cava is normal in size with <50% respiratory variability, suggesting right atrial pressure of 8 mmHg. Comparison(s): No prior Echocardiogram. FINDINGS  Left Ventricle: Left ventricular ejection fraction, by estimation, is 70 to 75%. The left ventricle has hyperdynamic function. The left ventricle has no regional wall motion abnormalities. The left ventricular internal cavity size was normal in size. There is mild left ventricular hypertrophy. Left ventricular diastolic parameters are indeterminate. Right Ventricle: The right ventricular size is normal. No increase in right ventricular wall thickness. Right ventricular systolic function is normal. Tricuspid regurgitation signal is inadequate for assessing PA pressure. Left Atrium: Left atrial size was normal in size. Right Atrium: Right atrial size was normal in size. Pericardium: No right atrial or right ventricular collapse to suggest tamponade physiology. Respiratory variation in mitral outflow not assessed. Presence of sinus tachycardia does raise concern however  for potential hemodynamic significance. Follow-up clinically and suggest repeat limited echocardiogram within tht  next 48 hours. A moderately sized pericardial effusion is present. The pericardial effusion is circumferential. Mitral Valve: The mitral valve is grossly normal. Mild mitral valve regurgitation. MV peak gradient, 13.1 mmHg. The mean mitral valve gradient is 6.0 mmHg. Tricuspid Valve: The tricuspid valve is grossly normal. Tricuspid valve regurgitation is trivial. Aortic Valve: The aortic valve is tricuspid. Aortic valve regurgitation is not visualized. No aortic stenosis is present. Aortic valve mean gradient measures 6.0 mmHg. Aortic valve peak gradient measures 10.8 mmHg. Aortic valve area, by VTI measures 2.42  cm. Pulmonic Valve: The pulmonic valve was grossly normal. Pulmonic valve regurgitation is trivial. Aorta: The aortic root is normal in size and structure. Venous: The inferior vena cava is normal in size with less than 50% respiratory variability, suggesting right atrial pressure of 8 mmHg. IAS/Shunts: No atrial level shunt detected by color flow Doppler.  LEFT VENTRICLE PLAX 2D LVIDd:         3.70 cm  Diastology LVIDs:         2.40 cm  LV e' medial:    9.17 cm/s LV PW:         1.10 cm  LV E/e' medial:  11.2 LV IVS:        1.10 cm  LV e' lateral:   9.17 cm/s LVOT diam:     2.00 cm  LV E/e' lateral: 11.2 LV SV:         58 LV SV Index:   33 LVOT Area:     3.14 cm  RIGHT VENTRICLE RV Basal diam:  2.30 cm RV Mid diam:    2.00 cm RV S prime:     27.70 cm/s TAPSE (M-mode): 3.3 cm LEFT ATRIUM             Index       RIGHT ATRIUM           Index LA diam:        3.30 cm 1.89 cm/m  RA Area:     10.80 cm LA Vol (A2C):   41.8 ml 23.96 ml/m RA Volume:   17.70 ml  10.15 ml/m LA Vol (A4C):   34.7 ml 19.89 ml/m LA Biplane Vol: 38.5 ml 22.07 ml/m  AORTIC VALVE AV Area (Vmax):    2.47 cm AV Area (Vmean):   2.20 cm AV Area (VTI):     2.42 cm AV Vmax:           164.00 cm/s AV Vmean:          109.000 cm/s AV VTI:            0.240 m AV Peak Grad:      10.8 mmHg AV Mean Grad:      6.0 mmHg LVOT Vmax:         129.00  cm/s LVOT Vmean:        76.400 cm/s LVOT VTI:          0.185 m LVOT/AV VTI ratio: 0.77  AORTA Ao Root diam: 3.20 cm MITRAL VALVE MV Area (PHT): 5.02 cm     SHUNTS MV Area VTI:   1.73 cm     Systemic VTI:  0.18 m MV Peak grad:  13.1 mmHg    Systemic Diam: 2.00 cm MV Mean grad:  6.0 mmHg MV Vmax:       1.81 m/s MV Vmean:      109.0 cm/s MV  Decel Time: 151 msec MV E velocity: 103.00 cm/s MV A velocity: 168.00 cm/s MV E/A ratio:  0.61 Rozann Lesches MD Electronically signed by Rozann Lesches MD Signature Date/Time: 01/16/2021/4:08:15 PM    Final    ECHOCARDIOGRAM LIMITED  Result Date: 01/22/2021    ECHOCARDIOGRAM LIMITED REPORT   Patient Name:   KWYNN SCHLOTTER Date of Exam: 01/22/2021 Medical Rec #:  [id number]     Height:       66.0 in Accession #:    [id number]    Weight:       161.6 lb Date of Birth:  1971-03-03     BSA:          1.827 m Patient Age:    81 years      BP:           107/64 mmHg Patient Gender: F             HR:           88 bpm. Exam Location:  [name] Procedure: Limited Echo Indications:    Pericardial effusion  History:        Patient has prior history of Echocardiogram examinations, most                 recent 01/19/2021. COPD, Arrythmias:PVC, Signs/Symptoms:Chest                 Pain; Risk Factors:Diabetes. COVID +, Tobacco abuse, Sepsis.  Sonographer:    Wenda Low Referring Phys: [phone] DAVID TAT IMPRESSIONS  1. Left ventricular ejection fraction, by estimation, is 60 to 65%. The left ventricle has normal function.  2. Moderate pericardial effusion. The pericardial effusion is circumferential. There is no evidence of cardiac tamponade. Effusion is stable compared to the 01/19/21 study  3. The inferior vena cava is normal in size with greater than 50% respiratory variability, suggesting right atrial pressure of 3 mmHg.  4. Limited echo to evaluate pericardial effusion FINDINGS  Left Ventricle: Left ventricular ejection fraction, by estimation, is 60 to 65%. The left ventricle has normal  function. Pericardium: A moderately sized pericardial effusion is present. The pericardial effusion is circumferential. There is no evidence of cardiac tamponade. Venous: The inferior vena cava is normal in size with greater than 50% respiratory variability, suggesting right atrial pressure of 3 mmHg. LEFT VENTRICLE PLAX 2D LVIDd:         3.20 cm LVIDs:         2.40 cm LV PW:         1.20 cm LV IVS:        0.90 cm LVOT diam:     2.10 cm LVOT Area:     3.46 cm  LEFT ATRIUM         Index LA diam:    3.20 cm 1.75 cm/m   AORTA Ao Root diam: 3.40 cm  SHUNTS Systemic Diam: 2.10 cm Carlyle Dolly MD Electronically signed by Carlyle Dolly MD Signature Date/Time: 01/22/2021/11:50:46 AM    Final    ECHOCARDIOGRAM LIMITED  Result Date: 01/19/2021    ECHOCARDIOGRAM LIMITED REPORT   Patient Name:   [facility] Date of Exam: 01/19/2021 Medical Rec #:  [id number]     Height:       66.0 in Accession #:    [id number]    Weight:       161.6 lb Date of Birth:  12/25/1970     BSA:          1.827 m  Patient Age:    60 years      BP:           99/68 mmHg Patient Gender: F             HR:           96 bpm. Exam Location:  [name] Procedure: Limited Echo Indications:    Pericardial effusion  History:        Patient has prior history of Echocardiogram examinations, most                 recent 01/16/2021. COPD, Arrythmias:PVC, Signs/Symptoms:Chest                 Pain; Risk Factors:Diabetes. COVID +, Tobacco Abuse, Sepsis,                 Pericardial effusion.  Sonographer:    Wenda Low Referring Phys: [place]  1. Left ventricular ejection fraction, by estimation, is 60 to 65%. The left ventricle has normal function.  2. Moderate pericardial effusion. The pericardial effusion is circumferential. There is no evidence of cardiac tamponade. Effusion looks larger compared to the 01/16/2021 study however remains overall moderate by measurements. Recommend repeating limited  study in 72 hours. FINDINGS   Left Ventricle: Left ventricular ejection fraction, by estimation, is 60 to 65%. The left ventricle has normal function. Pericardium: A moderately sized pericardial effusion is present. The pericardial effusion is circumferential. There is no evidence of cardiac tamponade. LEFT VENTRICLE PLAX 2D LVIDd:         3.90 cm LVIDs:         2.40 cm LV PW:         1.00 cm LV IVS:        1.00 cm LVOT diam:     2.00 cm LVOT Area:     3.14 cm  LEFT ATRIUM         Index LA diam:    3.40 cm 1.86 cm/m   SHUNTS Systemic Diam: 2.00 cm Carlyle Dolly MD Electronically signed by Carlyle Dolly MD Signature Date/Time: 01/19/2021/11:20:45 AM    Final    [place] EKG SITE RITE  Result Date: 01/16/2021 If Site Rite image not attached, placement could not be confirmed due to current cardiac rhythm.   Roxan Hockey, MD  [facility]  If 7PM-7AM, please contact night-coverage [email or web address] Password [email or web address] 01/22/2021, 5:05 PM   LOS: 6 days

## 2021-01-23 ENCOUNTER — Telehealth: Payer: Self-pay | Admitting: Gastroenterology

## 2021-01-23 DIAGNOSIS — R197 Diarrhea, unspecified: Secondary | ICD-10-CM

## 2021-01-23 DIAGNOSIS — N179 Acute kidney failure, unspecified: Secondary | ICD-10-CM | POA: Diagnosis not present

## 2021-01-23 LAB — RENAL FUNCTION PANEL
Albumin: 2.3 g/dL — ABNORMAL LOW (ref 3.5–5.0)
Anion gap: 9 (ref 5–15)
BUN: <5 mg/dL — ABNORMAL LOW (ref 6–20)
CO2: 21 mmol/L — ABNORMAL LOW (ref 22–32)
Calcium: 8 mg/dL — ABNORMAL LOW (ref 8.9–10.3)
Chloride: 107 mmol/L (ref 98–111)
Creatinine, Ser: 0.75 mg/dL (ref 0.44–1.00)
GFR, Estimated: >60 mL/min (ref >60)
Glucose, Bld: 140 mg/dL — ABNORMAL HIGH (ref 70–99)
Phosphorus: 3.5 mg/dL (ref 2.5–4.6)
Potassium: 2.9 mmol/L — ABNORMAL LOW (ref 3.5–5.1)
Sodium: 137 mmol/L (ref 135–145)

## 2021-01-23 LAB — CBC
HCT: 32.6 % — ABNORMAL LOW (ref 36.0–46.0)
Hemoglobin: 10.5 g/dL — ABNORMAL LOW (ref 12.0–15.0)
MCH: 30.5 pg (ref 26.0–34.0)
MCHC: 32.2 g/dL (ref 30.0–36.0)
MCV: 94.8 fL (ref 80.0–100.0)
Platelets: 483 10*3/uL — ABNORMAL HIGH (ref 150–400)
RBC: 3.44 MIL/uL — ABNORMAL LOW (ref 3.87–5.11)
RDW: 14.7 % (ref 11.5–15.5)
WBC: 16.3 10*3/uL — ABNORMAL HIGH (ref 4.0–10.5)
nRBC: 0 % (ref 0.0–0.2)

## 2021-01-23 MED ORDER — POTASSIUM CHLORIDE CRYS ER 20 MEQ PO TBCR
40.0000 meq | EXTENDED_RELEASE_TABLET | Freq: Once | ORAL | Status: AC
  Administered 2021-01-23: 40 meq via ORAL
  Filled 2021-01-23: qty 2

## 2021-01-23 MED ORDER — POTASSIUM CHLORIDE CRYS ER 20 MEQ PO TBCR
40.0000 meq | EXTENDED_RELEASE_TABLET | ORAL | Status: AC
  Administered 2021-01-23 (×2): 40 meq via ORAL
  Filled 2021-01-23 (×2): qty 2

## 2021-01-23 NOTE — Telephone Encounter (Signed)
Patient needs hospital follow up for colitis, consider colonoscopy.

## 2021-01-23 NOTE — Progress Notes (Signed)
PROGRESS NOTE  [name] TIR:[id number] DOB: December 13, 1970 DOA: 01/16/2021 PCP: Teodoro Kil, PA-C  Brief History:   50 y.o. female, with history of COPD, Corhn's, GERD, HTN, T2DM, and more presents to the ED with a chief complaint of seizure.  It is unclear how accurate patient's history is.  She reports that she was sitting on the toilet, attempting to void but could not start a stream, when she had a seizure.  When asked how she knew it was a seizure she reports it is because she has had seizures before.  Patient reports that she is supposed to be on seizure medication but she does not know the moderate the dose.  She reports that she remembers the whole thing.  There was no rhythmic jerking or twitching.  She reports she just knows it was a seizure.  Patient reports that she did not lose consciousness and remembers the whole thing.  Somehow, a "stranger" found her and brought her to the hospital per her report.  I discussed with the patient that her labs would indicate that she was laying on the ground for some time, and asked if she had lost time.  Patient reports that she hit the ground several times yesterday because she found out that her mother and father died at the same time in 2 different locations.  She was noted to have severe dehydration, rhabdomyolysis and AKI with SIRS of unknown source.       Assessment/Plan: Sepsis Due to colitis--POA -presented with leukocytosis, tachycardia, AMS  -CT abdomen and pelvis shows distal colitis, extending from the splenic flexure of the colon into the rectum. The walls of the distal rectum are ill-defined, and rectal perforation difficult to exclude. No drainable fluid collectionidentified. -Presented with markedly elevated procalcitonin level and elevated lactic acid level was empirically started on IV antibiotics broad-spectrum at time of admission -WBC trending back down, abdominal pain, frequency and volume of stools  improving -continue Cipro and Flagyl -blood cultures neg -CXR--neg for consolidation -UA no pyuria  Abdominal pain/Loose Stools>>Colitis -01/21/21 CT abd/pelvis--distal colitis, extending from the splenic flexure of the colon into the rectum;  walls of the distal rectum are ill-defined, and rectal perforation difficult to exclude. No drainable fluid collection identified. -review of medical records showed pt reports dx of Crohn's diagnosed [facility] at [facility] but numerous studies including colonoscopy (05/24/2008) which showed normal terminal ileum and SBFT (07/26/2011) which showed normal SB have questioned if this was a true diagnosis -Given initial clinical improvement on IV abx and questionable Crohn's diagnosis>>restart abx (cipro/flagyl) GI consult appreciated recommends continue antibiotics for about a week or so -Obtain stool sample for stool pathogen panel -Obtain stool sample for stool calprotectin -Lactic acid improved to 2.0 from 4.8   Acute metabolic encephalopathy - -Likely secondary to postictal state from withdrawal seizure and AKI --overall improved with supportive measures -01/21/21--significant other at bedside states pt is near baseline -CT did not show any acute abnormalities. -EEG unrevealing epileptiform activity noted -d/c Elavil and Atarax -UDS-->positive THC -01/16/21 VBG--no hypercarbia -check B12--701 -folate--17.9 -TSH--2.063 -ammonia--22 -9/16 UA--no pyuria   Xanax withdrawal seizure -appreciate neurology consult -PDMP reviewed--pt receives xanax 1 mg, #90 monthly -restarted xanax -she apparently ran out 3-4 days prior to admission and significant other found her lethargic on floor -Avoid abrupt discontinuation of benzos    AKI -serum creatinine peaked 5.36 -baseline creatinine 0.8-0.9 -improved with IVF -Appreciate nephrology consultation and recommendations   COVID  infection -currently asymptomatic-following with supportive measures -stable on  RA   Moderate pericardial effusion  - repeat Echo done 9/19 slightly larger effusion noted - Dr. Harl Bowie recommended repeating limited Echo in 72 hours -repeat echo 01/22/21  Nontraumatic Rhabdomyolysis -CK 2980>>49 -improved with IVF   Right foot pain  - soft tissue injury only, xray images with no finding of fracture   Hypomagnesemia -replete      Status is: Inpatient   Remains inpatient appropriate because:Inpatient level of care appropriate due to severity of illness   Dispo: The patient is from: Home              Anticipated d/c is to: Home              Patient currently is not medically stable to d/c.              Difficult to place patient No               Family Communication:   significant other updated at bedside 9/22  Consultants:  renal, neurology   Code Status:  FULL    DVT Prophylaxis:  [place] Heparin      Procedures: As Listed in Progress Note Above   Antibiotics: None  Subjective: -Abdominal pain improving, tolerating oral intake - frequency and volume of stools improving No fever  Or chills  -Significant other at bedside, questions answered  Objective: Vitals:   01/22/21 0522 01/22/21 1208 01/22/21 2054 01/23/21 1313  BP: 107/64 110/65 105/61 110/61  Pulse: 88 76 88 95  Resp: 18 18 18 16   Temp: 98.6 F (37 C) 97.7 F (36.5 C) 98.3 F (36.8 C) (!) 97.3 F (36.3 C)  TempSrc: Oral Oral Oral Oral  SpO2: 92% 93% 92% 95%  Weight:      Height:        Intake/Output Summary (Last 24 hours) at 01/23/2021 1826 Last data filed at 01/23/2021 1721 Gross per 24 hour  Intake 760 ml  Output 3 ml  Net 757 ml   Weight change:  Exam:  General:  Pt is alert, follows commands appropriately, not in acute distress HEENT: No icterus, No thrush, No neck mass, [place]/AT Cardiovascular: RRR, S1/S2, no rubs, no gallops Respiratory: CTA bilaterally, no wheezing, no crackles, no rhonchi Abdomen: Soft/+BS, improved tenderness,, non distended, no  guarding Extremities: No edema, No lymphangitis, No petechiae, No rashes, no synovitis   Data Reviewed: I have personally reviewed following labs and imaging studies Basic Metabolic Panel: Recent Labs  Lab 01/17/21 0444 01/18/21 0308 01/19/21 0620 01/21/21 0533 01/22/21 0603 01/23/21 0613  NA 134* 134* 133* 135 135 137  K 4.6 3.7 3.3* 3.1* 3.5 2.9*  CL 103 105 101 105 105 107  CO2 20* 24 26 22  21* 21*  GLUCOSE 118* 103* 96 109* 94 140*  BUN 15 10 9 7 6  <5*  CREATININE 1.27* 0.85 0.96 0.86 0.88 0.75  CALCIUM 8.5* 7.8* 8.2* 8.0* 8.2* 8.0*  MG 1.8 1.6* 2.0 1.7 2.3  --   PHOS  --   --   --   --   --  3.5   Liver Function Tests: Recent Labs  Lab 01/17/21 0444 01/18/21 0308 01/21/21 0533 01/22/21 0603 01/23/21 0613  AST 67* 42* 72* 56*  --   ALT 32 23 34 33  --   ALKPHOS 142* 93 194* 208*  --   BILITOT 0.6 0.6 0.1* 0.2*  --   PROT 5.6* 4.4* 5.1* 6.0*  --  ALBUMIN 2.7* 2.1* 2.4* 2.7* 2.3*   No results for input(s): LIPASE, AMYLASE in the last 168 hours. Recent Labs  Lab 01/21/21 0533  AMMONIA 22   Coagulation Profile: No results for input(s): INR, PROTIME in the last 168 hours. CBC: Recent Labs  Lab 01/17/21 0444 01/18/21 0308 01/21/21 0533 01/22/21 0603 01/23/21 0613  WBC 10.7* 9.5 12.6* 18.4* 16.3*  NEUTROABS 8.8* 7.9*  --  13.1*  --   HGB 12.6 10.9* 11.2* 12.0 10.5*  HCT 37.9 33.2* 34.2* 38.0 32.6*  MCV 92.9 94.1 96.3 95.7 94.8  PLT 277 222 360 392 483*   Cardiac Enzymes: Recent Labs  Lab 01/17/21 0444 01/18/21 0308 01/21/21 0533  CKTOTAL 1,451* 313* 49   BNP: Invalid input(s): POCBNP CBG: No results for input(s): GLUCAP in the last 168 hours. HbA1C: No results for input(s): HGBA1C in the last 72 hours. Urine analysis:    Component Value Date/Time   COLORURINE YELLOW 01/16/2021 0256   APPEARANCEUR HAZY (A) 01/16/2021 0256   LABSPEC 1.008 01/16/2021 0256   PHURINE 6.0 01/16/2021 0256   GLUCOSEU NEGATIVE 01/16/2021 0256   GLUCOSEU NEG  mg/dL 12/24/2009 2317   HGBUR LARGE (A) 01/16/2021 0256   BILIRUBINUR NEGATIVE 01/16/2021 0256   KETONESUR NEGATIVE 01/16/2021 0256   PROTEINUR 30 (A) 01/16/2021 0256   UROBILINOGEN 0.2 12/26/2013 1530   NITRITE NEGATIVE 01/16/2021 0256   LEUKOCYTESUR NEGATIVE 01/16/2021 0256   Sepsis Labs: @LABRCNTIP (procalcitonin:4,lacticidven:4) ) Recent Results (from the past 240 hour(s))  Resp Panel by RT-PCR (Flu A&B, Covid) Nasopharyngeal Swab     Status: Abnormal   Collection Time: 01/16/21  2:32 AM   Specimen: Nasopharyngeal Swab; Nasopharyngeal(NP) swabs in vial transport medium  Result Value Ref Range Status   SARS Coronavirus 2 by RT PCR POSITIVE (A) NEGATIVE Final    Comment: RESULT CALLED TO, READ BACK BY AND VERIFIED WITH: HEARN,J@[id number]  by Matthews,b 9.16.22 (NOTE) SARS-CoV-2 target nucleic acids are DETECTED.  The SARS-CoV-2 RNA is generally detectable in upper respiratory specimens during the acute phase of infection. Positive results are indicative of the presence of the identified virus, but do not rule out bacterial infection or co-infection with other pathogens not detected by the test. Clinical correlation with patient history and other diagnostic information is necessary to determine patient infection status. The expected result is Negative.  Fact Sheet for Patients: EntrepreneurPulse.com.au  Fact Sheet for Healthcare Providers: IncredibleEmployment.be  This test is not yet approved or cleared by the [place] FDA and  has been authorized for detection and/or diagnosis of SARS-CoV-2 by FDA under an Emergency Use Authorization (EUA).  This EUA will remain in effect (meaning this test can b e used) for the duration of  the COVID-19 declaration under Section 564(b)(1) of the Act, 21 U.S.C. section 360bbb-3(b)(1), unless the authorization is terminated or revoked sooner.     Influenza A by PCR NEGATIVE NEGATIVE Final   Influenza B  by PCR NEGATIVE NEGATIVE Final    Comment: (NOTE) The Xpert Xpress SARS-CoV-2/FLU/RSV plus assay is intended as an aid in the diagnosis of influenza from Nasopharyngeal swab specimens and should not be used as a sole basis for treatment. Nasal washings and aspirates are unacceptable for Xpert Xpress SARS-CoV-2/FLU/RSV testing.  Fact Sheet for Patients: EntrepreneurPulse.com.au  Fact Sheet for Healthcare Providers: IncredibleEmployment.be  This test is not yet approved or cleared by the [place] FDA and has been authorized for detection and/or diagnosis of SARS-CoV-2 by FDA under an Emergency Use Authorization (EUA). This EUA will  remain in effect (meaning this test can be used) for the duration of the COVID-19 declaration under Section 564(b)(1) of the Act, 21 U.S.C. section 360bbb-3(b)(1), unless the authorization is terminated or revoked.  Performed at [facility], [address] [place], [place] [address]   MRSA Next Gen by PCR, Nasal     Status: None   Collection Time: 01/16/21  5:11 AM   Specimen: Nasal Mucosa; Nasal Swab  Result Value Ref Range Status   MRSA by PCR Next Gen NOT DETECTED NOT DETECTED Final    Comment: (NOTE) The GeneXpert MRSA Assay (FDA approved for NASAL specimens only), is one component of a comprehensive MRSA colonization surveillance program. It is not intended to diagnose MRSA infection nor to guide or monitor treatment for MRSA infections. Test performance is not FDA approved in patients less than 49 years old. Performed at [facility], [address] [place], [place] [address]   Culture, blood (routine x 2)     Status: None   Collection Time: 01/16/21  8:24 AM   Specimen: BLOOD RIGHT ARM  Result Value Ref Range Status   Specimen Description   Final    BLOOD RIGHT ARM BOTTLES DRAWN AEROBIC AND ANAEROBIC   Special Requests Blood Culture adequate volume  Final   Culture   Final    NO GROWTH 5  DAYS Performed at [facility], [address] [place], [place] [address]    Report Status 01/21/2021 FINAL  Final     Scheduled Meds:  cycloSPORINE  1 drop Both Eyes BID   DULoxetine  30 mg Oral Daily   ezetimibe  10 mg Oral Daily   folic acid  1 mg Oral Daily   gabapentin  300 mg Oral TID   heparin  5,000 Units Subcutaneous Q8H   levETIRAcetam  750 mg Oral BID   multivitamin with minerals  1 tablet Oral Daily   nicotine  14 mg Transdermal Daily   pantoprazole  40 mg Oral Daily   potassium chloride  40 mEq Oral Once   thiamine  100 mg Oral Daily   Or   thiamine  100 mg Intravenous Daily   topiramate  25 mg Oral BID   Continuous Infusions:  ciprofloxacin 400 mg (01/23/21 0506)   lactated ringers 10 mL/hr at 01/19/21 1604   metronidazole 500 mg (01/23/21 1721)    Procedures/Studies: CT HEAD WO CONTRAST (5MM)  Result Date: 01/16/2021 CLINICAL DATA:  Status post seizure. EXAM: CT HEAD WITHOUT CONTRAST TECHNIQUE: Contiguous axial images were obtained from the base of the skull through the vertex without intravenous contrast. COMPARISON:  June 03, 2020 FINDINGS: Brain: No evidence of acute infarction, hemorrhage, hydrocephalus, extra-axial collection or mass lesion/mass effect. Vascular: No hyperdense vessel or unexpected calcification. Skull: Normal. Negative for fracture or focal lesion. Sinuses/Orbits: A small right maxillary sinus air-fluid level is seen. Other: None. IMPRESSION: 1. No acute intracranial abnormality. Electronically Signed   By: Virgina Norfolk M.D.   On: 01/16/2021 01:08   CT ABDOMEN PELVIS W CONTRAST  Result Date: 01/21/2021 CLINICAL DATA:  Abdominal pain, acute nonlocalized fever, leukocytosis. Abdominal abscess/infection suspected. COVID positive. History of COPD, collagen vascular disease and Crohn's disease. EXAM: CT ABDOMEN AND PELVIS WITH CONTRAST TECHNIQUE: Multidetector CT imaging of the abdomen and pelvis was performed using the standard protocol  following bolus administration of intravenous contrast. CONTRAST:  96m OMNIPAQUE IOHEXOL 350 MG/ML SOLN COMPARISON:  CT 09/14/2015. FINDINGS: Lower chest: New small to moderate dependent pleural effusions with associated  bibasilar atelectasis at both lung bases. There is a small pericardial effusion as well. The heart size is normal. No acute vascular findings are evident. Hepatobiliary: The liver is normal in density without suspicious focal abnormality. No significant biliary dilatation status post cholecystectomy. Pancreas: Unremarkable. No pancreatic ductal dilatation or surrounding inflammatory changes. Spleen: Normal in size without focal abnormality. Adrenals/Urinary Tract: Both adrenal glands appear normal. Both kidneys appear normal. No evidence of urinary tract calculus, hydronephrosis or perinephric soft tissue stranding. The urinary bladder is moderately distended. There is a small amount of air within the bladder lumen. No bladder wall thickening or immediate surrounding inflammatory changes identified. Stomach/Bowel: Enteric contrast was administered and has passed into the mid small bowel. The stomach appears normal for its degree of distention. There is no significant small bowel distension, wall thickening or surrounding inflammation. By report, the patient is status post appendectomy. The proximal colon is fluid-filled and mildly distended. There is diffuse wall thickening of the colon extending from the splenic flexure to the rectum. Wall thickening and enhancement are most prominent within the proximal rectum. Distally, the rectal walls are less well-defined, and distal rectal perforation is difficult to exclude. There is moderate perirectal soft tissue stranding. These inflammatory changes do not appear to directly abut the posterior wall of the bladder. No discrete fistula are seen. Vascular/Lymphatic: There are no enlarged abdominal or pelvic lymph nodes. No acute vascular findings. The  portal, superior mesenteric and splenic veins are patent. Reproductive: Hysterectomy. No adnexal mass. There is no gas within the vagina. Other: Small amount of pelvic ascites. As above, possible perirectal fluid without drainable fluid collection or fistula. No other extraluminal fluid collections are identified. There is no free air. Musculoskeletal: No acute or significant osseous findings. No evidence of discitis or sacroiliitis. IMPRESSION: 1. Findings are consistent with distal colitis, extending from the splenic flexure of the colon into the rectum. This colitis is presumably secondary to the patient's Crohn's disease. 2. The walls of the distal rectum are ill-defined, and rectal perforation difficult to exclude. No drainable fluid collection identified. 3. Gas within the wall of the bladder without bladder wall thickening or immediate surrounding inflammatory changes. The gas could be iatrogenic, although if catheterization has not been recently performed, a colovesical fistula would be a consideration. 4. No evidence of small-bowel inflammation or obstruction. 5. Bilateral pleural effusions with small pericardial effusion and bibasilar atelectasis. 6. These results will be called to the ordering clinician or representative by the Radiologist Assistant, and communication documented in the PACS or [employer]. Electronically Signed   By: Richardean Sale M.D.   On: 01/21/2021 16:45   US RENAL  Result Date: 01/16/2021 CLINICAL DATA:  Acute kidney injury EXAM: RENAL / URINARY TRACT ULTRASOUND COMPLETE COMPARISON:  None. FINDINGS: Right Kidney: Renal measurements: 10.7 x 5.0 x 5.5 cm = volume: 156 mL. Echogenicity within normal limits. No mass or hydronephrosis visualized. Left Kidney: Renal measurements: 10.3 x 6.0 x 5.3 cm = volume: 172 mL. Echogenicity within normal limits. No mass or hydronephrosis visualized. Bladder: Appears normal for degree of bladder distention. Bilateral ureteral jets are not  noted, and the patient is not able to void at the time of the examination. Other: None. IMPRESSION: 1. Unremarkable sonographic appearance of the kidneys. No hydronephrosis. 2. Bilateral ureteral jets are not noted, and the patient is not able to void at the time of the examination. Correlate for urinary retention. Electronically Signed   By: Eddie Candle M.D.   On:  01/16/2021 13:04   US Carotid Bilateral  Result Date: 01/16/2021 CLINICAL DATA:  Altered mental status, seizures EXAM: BILATERAL CAROTID DUPLEX ULTRASOUND TECHNIQUE: Pearline Cables scale imaging, color Doppler and duplex ultrasound were performed of bilateral carotid and vertebral arteries in the neck. COMPARISON:  None. FINDINGS: Criteria: Quantification of carotid stenosis is based on velocity parameters that correlate the residual internal carotid diameter with NASCET-based stenosis levels, using the diameter of the distal internal carotid lumen as the denominator for stenosis measurement. The following velocity measurements were obtained: RIGHT ICA: 127/43 cm/sec CCA: 161/09 cm/sec SYSTOLIC ICA/CCA RATIO:  0.9 ECA:  118 cm/sec LEFT ICA: 145/41 cm/sec CCA: 604/54 cm/sec SYSTOLIC ICA/CCA RATIO:  1.1 ECA:  107 cm/sec RIGHT CAROTID ARTERY: No significant atherosclerotic plaque or evidence of stenosis in the internal carotid artery. RIGHT VERTEBRAL ARTERY:  Patent with normal antegrade flow. LEFT CAROTID ARTERY: Trace smooth heterogeneous atherosclerotic plaque in the proximal internal carotid artery. By peak systolic velocity criteria in the region of plaque, the estimated stenosis is less than 50%. LEFT VERTEBRAL ARTERY:  Patent with normal antegrade flow. IMPRESSION: 1. Mild (1-49%) stenosis proximal left internal carotid artery secondary to trace smooth heterogeneous atherosclerotic plaque. 2. No significant atherosclerotic plaque or evidence of stenosis in the right internal carotid artery. 3. Vertebral arteries are patent with normal antegrade flow.  Signed, Criselda Peaches, MD, [facility] Radiology Electronically Signed   By: Jacqulynn Cadet M.D.   On: 01/16/2021 11:00   DG CHEST PORT 1 VIEW  Result Date: 01/17/2021 CLINICAL DATA:  Severe sepsis.  Patient is COVID-19 positive. EXAM: PORTABLE CHEST 1 VIEW COMPARISON:  January 16, 2021 FINDINGS: Heart, hila, mediastinum are normal. No pneumothorax. No nodules or masses. Minimal haziness in the left costophrenic angle, likely atelectasis. No suspicious infiltrates are identified. IMPRESSION: Minimal haziness in the left costophrenic angle is favored to represent atelectasis. No other abnormalities are identified. No suspicious infiltrates. Electronically Signed   By: Dorise Bullion III M.D.   On: 01/17/2021 08:13   DG Chest Port 1 View  Result Date: 01/16/2021 CLINICAL DATA:  Seizure. EXAM: PORTABLE CHEST 1 VIEW COMPARISON:  May 09, 2016 FINDINGS: The heart size and mediastinal contours are within normal limits. Both lungs are clear. The visualized skeletal structures are unremarkable. IMPRESSION: No active disease. Electronically Signed   By: Virgina Norfolk M.D.   On: 01/16/2021 01:02   DG Foot 2 Views Right  Result Date: 01/19/2021 CLINICAL DATA:  Recent fall with bruising EXAM: RIGHT FOOT - 2 VIEW COMPARISON:  None. FINDINGS: There is no evidence of fracture or dislocation. There is no evidence of arthropathy or other focal bone abnormality. Soft tissues are unremarkable. IMPRESSION: Negative. Electronically Signed   By: Jorje Guild M.D.   On: 01/19/2021 11:14   EEG adult  Result Date: 01/16/2021 Lora Havens, MD     01/16/2021 10:16 PM Patient Name: Francina Beery MRN: [id number] Epilepsy Attending: Lora Havens Referring Physician/Provider: Dr Clearence Ped, [place] B Date: 01/16/2021 Duration: 22.32 mins Patient history: 50 yo F with AMS and metabolic changes, Likely due to xanax withdrawal seizures. EEG to evaluate  for seizure Level of alertness: Awake AEDs during EEG study: LEV, GBP Technical aspects: This EEG study was done with scalp electrodes positioned according to the 10-20 International system of electrode placement. Electrical activity was acquired at a sampling rate of 500Hz  and reviewed with a high frequency filter of 70Hz  and a low frequency filter of 1Hz . EEG data  were recorded continuously and digitally stored. Description: The posterior dominant rhythm consists of 9 Hz activity of moderate voltage (25-35 uV) seen predominantly in posterior head regions, symmetric and reactive to eye opening and eye closing. EEG showed intermittent generalized 5 to 6 Hz theta slowing. Hyperventilation and photic stimulation were not performed.   ABNORMALITY - Intermittent slow, generalized IMPRESSION: This study is suggestive of mild diffuse encephalopathy, nonspecific etiology. No seizures or epileptiform discharges were seen throughout the recording. Lora Havens   ECHOCARDIOGRAM COMPLETE  Result Date: 01/16/2021    ECHOCARDIOGRAM REPORT   Patient Name:   [name] Date of Exam: 01/16/2021 Medical Rec #:  [id number]     Height:       66.0 in Accession #:    [id number]    Weight:       145.0 lb Date of Birth:  [birth year]     BSA:          1.744 m Patient Age:    58 years      BP:           81/20 mmHg Patient Gender: F             HR:           111 bpm. Exam Location:  [name] Procedure: 2D Echo, Cardiac Doppler and Color Doppler Indications:    Syncope  History:        Patient has prior history of Echocardiogram examinations, most                 recent 07/11/2010. COPD, Signs/Symptoms:Dyspnea; Risk                 Factors:Tobacco abuse and Diabetes. COVID +.  Sonographer:    Wenda Low Referring Phys: ASIA B Morris  1. Left ventricular ejection fraction, by estimation, is 70 to 75%. The left ventricle has hyperdynamic function. The left ventricle has no regional wall motion abnormalities.  There is mild left ventricular hypertrophy. Left ventricular diastolic parameters are indeterminate.  2. Right ventricular systolic function is normal. The right ventricular size is normal. Tricuspid regurgitation signal is inadequate for assessing PA pressure.  3. No right atrial or right ventricular collapse to suggest tamponade physiology. Respiratory variation in mitral outflow not assessed. Presence of sinus tachycardia does raise concern however for potential hemodynamic significance. Follow-up clinically  and suggest repeat limited echocardiogram within tht next 48 hours. Moderate pericardial effusion. The pericardial effusion is circumferential.  4. The mitral valve is grossly normal. Mild mitral valve regurgitation.  5. The aortic valve is tricuspid. Aortic valve regurgitation is not visualized. No aortic stenosis is present. Aortic valve mean gradient measures 6.0 mmHg.  6. The inferior vena cava is normal in size with <50% respiratory variability, suggesting right atrial pressure of 8 mmHg. Comparison(s): No prior Echocardiogram. FINDINGS  Left Ventricle: Left ventricular ejection fraction, by estimation, is 70 to 75%. The left ventricle has hyperdynamic function. The left ventricle has no regional wall motion abnormalities. The left ventricular internal cavity size was normal in size. There is mild left ventricular hypertrophy. Left ventricular diastolic parameters are indeterminate. Right Ventricle: The right ventricular size is normal. No increase in right ventricular wall thickness. Right ventricular systolic function is normal. Tricuspid regurgitation signal is inadequate for assessing PA pressure. Left Atrium: Left atrial size was normal in size. Right Atrium: Right atrial size was normal in size. Pericardium: No right atrial or right ventricular collapse to suggest tamponade physiology. Respiratory  variation in mitral outflow not assessed. Presence of sinus tachycardia does raise concern however  for potential hemodynamic significance. Follow-up clinically and suggest repeat limited echocardiogram within tht next 48 hours. A moderately sized pericardial effusion is present. The pericardial effusion is circumferential. Mitral Valve: The mitral valve is grossly normal. Mild mitral valve regurgitation. MV peak gradient, 13.1 mmHg. The mean mitral valve gradient is 6.0 mmHg. Tricuspid Valve: The tricuspid valve is grossly normal. Tricuspid valve regurgitation is trivial. Aortic Valve: The aortic valve is tricuspid. Aortic valve regurgitation is not visualized. No aortic stenosis is present. Aortic valve mean gradient measures 6.0 mmHg. Aortic valve peak gradient measures 10.8 mmHg. Aortic valve area, by VTI measures 2.42  cm. Pulmonic Valve: The pulmonic valve was grossly normal. Pulmonic valve regurgitation is trivial. Aorta: The aortic root is normal in size and structure. Venous: The inferior vena cava is normal in size with less than 50% respiratory variability, suggesting right atrial pressure of 8 mmHg. IAS/Shunts: No atrial level shunt detected by color flow Doppler.  LEFT VENTRICLE PLAX 2D LVIDd:         3.70 cm  Diastology LVIDs:         2.40 cm  LV e' medial:    9.17 cm/s LV PW:         1.10 cm  LV E/e' medial:  11.2 LV IVS:        1.10 cm  LV e' lateral:   9.17 cm/s LVOT diam:     2.00 cm  LV E/e' lateral: 11.2 LV SV:         58 LV SV Index:   33 LVOT Area:     3.14 cm  RIGHT VENTRICLE RV Basal diam:  2.30 cm RV Mid diam:    2.00 cm RV S prime:     27.70 cm/s TAPSE (M-mode): 3.3 cm LEFT ATRIUM             Index       RIGHT ATRIUM           Index LA diam:        3.30 cm 1.89 cm/m  RA Area:     10.80 cm LA Vol (A2C):   41.8 ml 23.96 ml/m RA Volume:   17.70 ml  10.15 ml/m LA Vol (A4C):   34.7 ml 19.89 ml/m LA Biplane Vol: 38.5 ml 22.07 ml/m  AORTIC VALVE AV Area (Vmax):    2.47 cm AV Area (Vmean):   2.20 cm AV Area (VTI):     2.42 cm AV Vmax:           164.00 cm/s AV Vmean:          109.000  cm/s AV VTI:            0.240 m AV Peak Grad:      10.8 mmHg AV Mean Grad:      6.0 mmHg LVOT Vmax:         129.00 cm/s LVOT Vmean:        76.400 cm/s LVOT VTI:          0.185 m LVOT/AV VTI ratio: 0.77  AORTA Ao Root diam: 3.20 cm MITRAL VALVE MV Area (PHT): 5.02 cm     SHUNTS MV Area VTI:   1.73 cm     Systemic VTI:  0.18 m MV Peak grad:  13.1 mmHg    Systemic Diam: 2.00 cm MV Mean grad:  6.0 mmHg MV Vmax:  1.81 m/s MV Vmean:      109.0 cm/s MV Decel Time: 151 msec MV E velocity: 103.00 cm/s MV A velocity: 168.00 cm/s MV E/A ratio:  0.61 Rozann Lesches MD Electronically signed by Rozann Lesches MD Signature Date/Time: 01/16/2021/4:08:15 PM    Final    ECHOCARDIOGRAM LIMITED  Result Date: 01/22/2021    ECHOCARDIOGRAM LIMITED REPORT   Patient Name:   [name] Date of Exam: 01/22/2021 Medical Rec #:  [id number]     Height:       66.0 in Accession #:    [id number]    Weight:       161.6 lb Date of Birth:  1970/11/01     BSA:          1.827 m Patient Age:    41 years      BP:           107/64 mmHg Patient Gender: F             HR:           88 bpm. Exam Location:  [name] Procedure: Limited Echo Indications:    Pericardial effusion  History:        Patient has prior history of Echocardiogram examinations, most                 recent 01/19/2021. COPD, Arrythmias:PVC, Signs/Symptoms:Chest                 Pain; Risk Factors:Diabetes. COVID +, Tobacco abuse, Sepsis.  Sonographer:    Wenda Low Referring Phys: [phone] DAVID TAT IMPRESSIONS  1. Left ventricular ejection fraction, by estimation, is 60 to 65%. The left ventricle has normal function.  2. Moderate pericardial effusion. The pericardial effusion is circumferential. There is no evidence of cardiac tamponade. Effusion is stable compared to the 01/19/21 study  3. The inferior vena cava is normal in size with greater than 50% respiratory variability, suggesting right atrial pressure of 3 mmHg.  4. Limited echo to evaluate pericardial effusion  FINDINGS  Left Ventricle: Left ventricular ejection fraction, by estimation, is 60 to 65%. The left ventricle has normal function. Pericardium: A moderately sized pericardial effusion is present. The pericardial effusion is circumferential. There is no evidence of cardiac tamponade. Venous: The inferior vena cava is normal in size with greater than 50% respiratory variability, suggesting right atrial pressure of 3 mmHg. LEFT VENTRICLE PLAX 2D LVIDd:         3.20 cm LVIDs:         2.40 cm LV PW:         1.20 cm LV IVS:        0.90 cm LVOT diam:     2.10 cm LVOT Area:     3.46 cm  LEFT ATRIUM         Index LA diam:    3.20 cm 1.75 cm/m   AORTA Ao Root diam: 3.40 cm  SHUNTS Systemic Diam: 2.10 cm Carlyle Dolly MD Electronically signed by Carlyle Dolly MD Signature Date/Time: 01/22/2021/11:50:46 AM    Final    ECHOCARDIOGRAM LIMITED  Result Date: 01/19/2021    ECHOCARDIOGRAM LIMITED REPORT   Patient Name:   [facility] Date of Exam: 01/19/2021 Medical Rec #:  [id number]     Height:       66.0 in Accession #:    [id number]    Weight:       161.6 lb Date of Birth:  06-Dec-1970  BSA:          1.827 m Patient Age:    46 years      BP:           99/68 mmHg Patient Gender: F             HR:           96 bpm. Exam Location:  [name] Procedure: Limited Echo Indications:    Pericardial effusion  History:        Patient has prior history of Echocardiogram examinations, most                 recent 01/16/2021. COPD, Arrythmias:PVC, Signs/Symptoms:Chest                 Pain; Risk Factors:Diabetes. COVID +, Tobacco Abuse, Sepsis,                 Pericardial effusion.  Sonographer:    Wenda Low Referring Phys: [place]  1. Left ventricular ejection fraction, by estimation, is 60 to 65%. The left ventricle has normal function.  2. Moderate pericardial effusion. The pericardial effusion is circumferential. There is no evidence of cardiac tamponade. Effusion looks larger compared to the  01/16/2021 study however remains overall moderate by measurements. Recommend repeating limited  study in 72 hours. FINDINGS  Left Ventricle: Left ventricular ejection fraction, by estimation, is 60 to 65%. The left ventricle has normal function. Pericardium: A moderately sized pericardial effusion is present. The pericardial effusion is circumferential. There is no evidence of cardiac tamponade. LEFT VENTRICLE PLAX 2D LVIDd:         3.90 cm LVIDs:         2.40 cm LV PW:         1.00 cm LV IVS:        1.00 cm LVOT diam:     2.00 cm LVOT Area:     3.14 cm  LEFT ATRIUM         Index LA diam:    3.40 cm 1.86 cm/m   SHUNTS Systemic Diam: 2.00 cm Carlyle Dolly MD Electronically signed by Carlyle Dolly MD Signature Date/Time: 01/19/2021/11:20:45 AM    Final    [place] EKG SITE RITE  Result Date: 01/16/2021 If Site Rite image not attached, placement could not be confirmed due to current cardiac rhythm.   Roxan Hockey, MD  [facility]  If 7PM-7AM, please contact night-coverage [email or web address] Password [facility] 01/23/2021, 6:26 PM   LOS: 7 days

## 2021-01-23 NOTE — Progress Notes (Signed)
Subjective:  Sleeping. Easily awakens. Denies abdominal pain, diarrhea. Does not feel hungry.   Objective: Vital signs in last 24 hours: Temp:  [98.3 F (36.8 C)] 98.3 F (36.8 C) (09/22 2054) Pulse Rate:  [88] 88 (09/22 2054) Resp:  [18] 18 (09/22 2054) BP: (105)/(61) 105/61 (09/22 2054) SpO2:  [92 %] 92 % (09/22 2054) Last BM Date: 01/20/21 General:   resting comfortably in NAD Head:  Normocephalic and atraumatic. Eyes:  Sclera clear, no icterus.  Abdomen:  Soft, nontender and nondistended. Normal bowel sounds, without guarding, and without rebound.   Extremities:  Without clubbing, deformity or edema. Neurologic:  Alert and  oriented x4;  grossly normal neurologically. Skin:  Intact without significant lesions or rashes. Psych:  Alert and cooperative. Normal mood and affect.  Intake/Output from previous day: 09/22 0701 - 09/23 0700 In: 720 [P.O.:720] Out: -  Intake/Output this shift: No intake/output data recorded.  Lab Results: CBC Recent Labs    01/21/21 0533 01/22/21 0603 01/23/21 0613  WBC 12.6* 18.4* 16.3*  HGB 11.2* 12.0 10.5*  HCT 34.2* 38.0 32.6*  MCV 96.3 95.7 94.8  PLT 360 392 483*   BMET Recent Labs    01/21/21 0533 01/22/21 0603 01/23/21 0613  NA 135 135 137  K 3.1* 3.5 2.9*  CL 105 105 107  CO2 22 21* 21*  GLUCOSE 109* 94 140*  BUN 7 6 <5*  CREATININE 0.86 0.88 0.75  CALCIUM 8.0* 8.2* 8.0*   LFTs Recent Labs    01/21/21 0533 01/22/21 0603 01/23/21 0613  BILITOT 0.1* 0.2*  --   ALKPHOS 194* 208*  --   AST 72* 56*  --   ALT 34 33  --   PROT 5.1* 6.0*  --   ALBUMIN 2.4* 2.7* 2.3*   No results for input(s): LIPASE in the last 72 hours. PT/INR No results for input(s): LABPROT, INR in the last 72 hours.    Imaging Studies: CT HEAD WO CONTRAST (5MM)  Result Date: 01/16/2021 CLINICAL DATA:  Status post seizure. EXAM: CT HEAD WITHOUT CONTRAST TECHNIQUE: Contiguous axial images were obtained from the base of the skull through the  vertex without intravenous contrast. COMPARISON:  June 03, 2020 FINDINGS: Brain: No evidence of acute infarction, hemorrhage, hydrocephalus, extra-axial collection or mass lesion/mass effect. Vascular: No hyperdense vessel or unexpected calcification. Skull: Normal. Negative for fracture or focal lesion. Sinuses/Orbits: A small right maxillary sinus air-fluid level is seen. Other: None. IMPRESSION: 1. No acute intracranial abnormality. Electronically Signed   By: Virgina Norfolk M.D.   On: 01/16/2021 01:08   CT ABDOMEN PELVIS W CONTRAST  Result Date: 01/21/2021 CLINICAL DATA:  Abdominal pain, acute nonlocalized fever, leukocytosis. Abdominal abscess/infection suspected. COVID positive. History of COPD, collagen vascular disease and Crohn's disease. EXAM: CT ABDOMEN AND PELVIS WITH CONTRAST TECHNIQUE: Multidetector CT imaging of the abdomen and pelvis was performed using the standard protocol following bolus administration of intravenous contrast. CONTRAST:  77m OMNIPAQUE IOHEXOL 350 MG/ML SOLN COMPARISON:  CT 09/14/2015. FINDINGS: Lower chest: New small to moderate dependent pleural effusions with associated bibasilar atelectasis at both lung bases. There is a small pericardial effusion as well. The heart size is normal. No acute vascular findings are evident. Hepatobiliary: The liver is normal in density without suspicious focal abnormality. No significant biliary dilatation status post cholecystectomy. Pancreas: Unremarkable. No pancreatic ductal dilatation or surrounding inflammatory changes. Spleen: Normal in size without focal abnormality. Adrenals/Urinary Tract: Both adrenal glands appear normal. Both kidneys appear normal. No evidence of  urinary tract calculus, hydronephrosis or perinephric soft tissue stranding. The urinary bladder is moderately distended. There is a small amount of air within the bladder lumen. No bladder wall thickening or immediate surrounding inflammatory changes identified.  Stomach/Bowel: Enteric contrast was administered and has passed into the mid small bowel. The stomach appears normal for its degree of distention. There is no significant small bowel distension, wall thickening or surrounding inflammation. By report, the patient is status post appendectomy. The proximal colon is fluid-filled and mildly distended. There is diffuse wall thickening of the colon extending from the splenic flexure to the rectum. Wall thickening and enhancement are most prominent within the proximal rectum. Distally, the rectal walls are less well-defined, and distal rectal perforation is difficult to exclude. There is moderate perirectal soft tissue stranding. These inflammatory changes do not appear to directly abut the posterior wall of the bladder. No discrete fistula are seen. Vascular/Lymphatic: There are no enlarged abdominal or pelvic lymph nodes. No acute vascular findings. The portal, superior mesenteric and splenic veins are patent. Reproductive: Hysterectomy. No adnexal mass. There is no gas within the vagina. Other: Small amount of pelvic ascites. As above, possible perirectal fluid without drainable fluid collection or fistula. No other extraluminal fluid collections are identified. There is no free air. Musculoskeletal: No acute or significant osseous findings. No evidence of discitis or sacroiliitis. IMPRESSION: 1. Findings are consistent with distal colitis, extending from the splenic flexure of the colon into the rectum. This colitis is presumably secondary to the patient's Crohn's disease. 2. The walls of the distal rectum are ill-defined, and rectal perforation difficult to exclude. No drainable fluid collection identified. 3. Gas within the wall of the bladder without bladder wall thickening or immediate surrounding inflammatory changes. The gas could be iatrogenic, although if catheterization has not been recently performed, a colovesical fistula would be a consideration. 4. No  evidence of small-bowel inflammation or obstruction. 5. Bilateral pleural effusions with small pericardial effusion and bibasilar atelectasis. 6. These results will be called to the ordering clinician or representative by the Radiologist Assistant, and communication documented in the PACS or [employer]. Electronically Signed   By: Richardean Sale M.D.   On: 01/21/2021 16:45   US RENAL  Result Date: 01/16/2021 CLINICAL DATA:  Acute kidney injury EXAM: RENAL / URINARY TRACT ULTRASOUND COMPLETE COMPARISON:  None. FINDINGS: Right Kidney: Renal measurements: 10.7 x 5.0 x 5.5 cm = volume: 156 mL. Echogenicity within normal limits. No mass or hydronephrosis visualized. Left Kidney: Renal measurements: 10.3 x 6.0 x 5.3 cm = volume: 172 mL. Echogenicity within normal limits. No mass or hydronephrosis visualized. Bladder: Appears normal for degree of bladder distention. Bilateral ureteral jets are not noted, and the patient is not able to void at the time of the examination. Other: None. IMPRESSION: 1. Unremarkable sonographic appearance of the kidneys. No hydronephrosis. 2. Bilateral ureteral jets are not noted, and the patient is not able to void at the time of the examination. Correlate for urinary retention. Electronically Signed   By: Eddie Candle M.D.   On: 01/16/2021 13:04   US Carotid Bilateral  Result Date: 01/16/2021 CLINICAL DATA:  Altered mental status, seizures EXAM: BILATERAL CAROTID DUPLEX ULTRASOUND TECHNIQUE: Pearline Cables scale imaging, color Doppler and duplex ultrasound were performed of bilateral carotid and vertebral arteries in the neck. COMPARISON:  None. FINDINGS: Criteria: Quantification of carotid stenosis is based on velocity parameters that correlate the residual internal carotid diameter with NASCET-based stenosis levels, using the diameter of the distal  internal carotid lumen as the denominator for stenosis measurement. The following velocity measurements were obtained: RIGHT ICA: 127/43  cm/sec CCA: 314/97 cm/sec SYSTOLIC ICA/CCA RATIO:  0.9 ECA:  118 cm/sec LEFT ICA: 145/41 cm/sec CCA: 026/37 cm/sec SYSTOLIC ICA/CCA RATIO:  1.1 ECA:  107 cm/sec RIGHT CAROTID ARTERY: No significant atherosclerotic plaque or evidence of stenosis in the internal carotid artery. RIGHT VERTEBRAL ARTERY:  Patent with normal antegrade flow. LEFT CAROTID ARTERY: Trace smooth heterogeneous atherosclerotic plaque in the proximal internal carotid artery. By peak systolic velocity criteria in the region of plaque, the estimated stenosis is less than 50%. LEFT VERTEBRAL ARTERY:  Patent with normal antegrade flow. IMPRESSION: 1. Mild (1-49%) stenosis proximal left internal carotid artery secondary to trace smooth heterogeneous atherosclerotic plaque. 2. No significant atherosclerotic plaque or evidence of stenosis in the right internal carotid artery. 3. Vertebral arteries are patent with normal antegrade flow. Signed, Criselda Peaches, MD, [facility] Electronically Signed   By: Jacqulynn Cadet M.D.   On: 01/16/2021 11:00   DG CHEST PORT 1 VIEW  Result Date: 01/17/2021 CLINICAL DATA:  Severe sepsis.  Patient is COVID-19 positive. EXAM: PORTABLE CHEST 1 VIEW COMPARISON:  January 16, 2021 FINDINGS: Heart, hila, mediastinum are normal. No pneumothorax. No nodules or masses. Minimal haziness in the left costophrenic angle, likely atelectasis. No suspicious infiltrates are identified. IMPRESSION: Minimal haziness in the left costophrenic angle is favored to represent atelectasis. No other abnormalities are identified. No suspicious infiltrates. Electronically Signed   By: Dorise Bullion III M.D.   On: 01/17/2021 08:13   DG Chest Port 1 View  Result Date: 01/16/2021 CLINICAL DATA:  Seizure. EXAM: PORTABLE CHEST 1 VIEW COMPARISON:  May 09, 2016 FINDINGS: The heart size and mediastinal contours are within normal limits. Both lungs are clear. The  visualized skeletal structures are unremarkable. IMPRESSION: No active disease. Electronically Signed   By: Virgina Norfolk M.D.   On: 01/16/2021 01:02   DG Foot 2 Views Right  Result Date: 01/19/2021 CLINICAL DATA:  Recent fall with bruising EXAM: RIGHT FOOT - 2 VIEW COMPARISON:  None. FINDINGS: There is no evidence of fracture or dislocation. There is no evidence of arthropathy or other focal bone abnormality. Soft tissues are unremarkable. IMPRESSION: Negative. Electronically Signed   By: Jorje Guild M.D.   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 EEG to evaluate for seizure Level of alertness: Awake AEDs during EEG study: LEV, GBP Technical aspects: This EEG study was done with scalp electrodes positioned according to the 10-20 International system of electrode placement. Electrical activity was acquired at a sampling rate of 500Hz  and reviewed with a high frequency filter of 70Hz  and a low frequency filter of 1Hz . EEG data were recorded continuously and digitally stored. Description: The posterior dominant rhythm consists of 9 Hz activity of moderate voltage (25-35 uV) seen predominantly in posterior head regions, symmetric and reactive to eye opening and eye closing. EEG showed intermittent generalized 5 to 6 Hz theta slowing. Hyperventilation and photic stimulation were not performed.   ABNORMALITY - Intermittent slow, generalized IMPRESSION: This study is suggestive of mild diffuse encephalopathy, nonspecific etiology. No seizures or  epileptiform discharges were seen throughout the recording. Lora Havens   ECHOCARDIOGRAM COMPLETE  Result Date: 01/16/2021     ECHOCARDIOGRAM REPORT   Patient Name:   LELANIA BIA Date of Exam: 01/16/2021 Medical Rec #:  [id number]     Height:       66.0 in Accession #:    [id number]    Weight:       145.0 lb Date of Birth:  12-27-1970     BSA:          1.744 m Patient Age:    3 years      BP:           81/20 mmHg Patient Gender: F             HR:           111 bpm. Exam Location:  [name] Procedure: 2D Echo, Cardiac Doppler and Color Doppler Indications:    Syncope  History:        Patient has prior history of Echocardiogram examinations, most                 recent 07/11/2010. COPD, Signs/Symptoms:Dyspnea; Risk                 Factors:Tobacco abuse and Diabetes. COVID +.  Sonographer:    Wenda Low Referring Phys: ASIA B [place]  1. Left ventricular ejection fraction, by estimation, is 70 to 75%. The left ventricle has hyperdynamic function. The left ventricle has no regional wall motion abnormalities. There is mild left ventricular hypertrophy. Left ventricular diastolic parameters are indeterminate.  2. Right ventricular systolic function is normal. The right ventricular size is normal. Tricuspid regurgitation signal is inadequate for assessing PA pressure.  3. No right atrial or right ventricular collapse to suggest tamponade physiology. Respiratory variation in mitral outflow not assessed. Presence of sinus tachycardia does raise concern however for potential hemodynamic significance. Follow-up clinically  and suggest repeat limited echocardiogram within tht next 48 hours. Moderate pericardial effusion. The pericardial effusion is circumferential.  4. The mitral valve is grossly normal. Mild mitral valve regurgitation.  5. The aortic valve is tricuspid. Aortic valve regurgitation is not visualized. No aortic stenosis is present. Aortic valve mean gradient measures 6.0 mmHg.  6. The inferior vena cava is normal in size with <50% respiratory variability, suggesting right atrial pressure of 8 mmHg. Comparison(s):  No prior Echocardiogram. FINDINGS  Left Ventricle: Left ventricular ejection fraction, by estimation, is 70 to 75%. The left ventricle has hyperdynamic function. The left ventricle has no regional wall motion abnormalities. The left ventricular internal cavity size was normal in size. There is mild left ventricular hypertrophy. Left ventricular diastolic parameters are indeterminate. Right Ventricle: The right ventricular size is normal. No increase in right ventricular wall thickness. Right ventricular systolic function is normal. Tricuspid regurgitation signal is inadequate for assessing PA pressure. Left Atrium: Left atrial size was normal in size. Right Atrium: Right atrial size was normal in size. Pericardium: No right atrial or right ventricular collapse to suggest tamponade physiology. Respiratory variation in mitral outflow not assessed. Presence of sinus tachycardia does raise concern however for potential hemodynamic significance. Follow-up clinically and suggest repeat limited echocardiogram within tht next 48 hours. A moderately sized pericardial effusion is present. The pericardial effusion is circumferential. Mitral Valve: The mitral valve is grossly normal. Mild mitral valve regurgitation. MV peak gradient, 13.1 mmHg. The mean mitral valve gradient is 6.0 mmHg. Tricuspid Valve: The tricuspid valve is  grossly normal. Tricuspid valve regurgitation is trivial. Aortic Valve: The aortic valve is tricuspid. Aortic valve regurgitation is not visualized. No aortic stenosis is present. Aortic valve mean gradient measures 6.0 mmHg. Aortic valve peak gradient measures 10.8 mmHg. Aortic valve area, by VTI measures 2.42  cm. Pulmonic Valve: The pulmonic valve was grossly normal. Pulmonic valve regurgitation is trivial. Aorta: The aortic root is normal in size and structure. Venous: The inferior vena cava is normal in size with less than 50% respiratory variability, suggesting right atrial pressure of 8 mmHg.  IAS/Shunts: No atrial level shunt detected by color flow Doppler.  LEFT VENTRICLE PLAX 2D LVIDd:         3.70 cm  Diastology LVIDs:         2.40 cm  LV e' medial:    9.17 cm/s LV PW:         1.10 cm  LV E/e' medial:  11.2 LV IVS:        1.10 cm  LV e' lateral:   9.17 cm/s LVOT diam:     2.00 cm  LV E/e' lateral: 11.2 LV SV:         58 LV SV Index:   33 LVOT Area:     3.14 cm  RIGHT VENTRICLE RV Basal diam:  2.30 cm RV Mid diam:    2.00 cm RV S prime:     27.70 cm/s TAPSE (M-mode): 3.3 cm LEFT ATRIUM             Index       RIGHT ATRIUM           Index LA diam:        3.30 cm 1.89 cm/m  RA Area:     10.80 cm LA Vol (A2C):   41.8 ml 23.96 ml/m RA Volume:   17.70 ml  10.15 ml/m LA Vol (A4C):   34.7 ml 19.89 ml/m LA Biplane Vol: 38.5 ml 22.07 ml/m  AORTIC VALVE AV Area (Vmax):    2.47 cm AV Area (Vmean):   2.20 cm AV Area (VTI):     2.42 cm AV Vmax:           164.00 cm/s AV Vmean:          109.000 cm/s AV VTI:            0.240 m AV Peak Grad:      10.8 mmHg AV Mean Grad:      6.0 mmHg LVOT Vmax:         129.00 cm/s LVOT Vmean:        76.400 cm/s LVOT VTI:          0.185 m LVOT/AV VTI ratio: 0.77  AORTA Ao Root diam: 3.20 cm MITRAL VALVE MV Area (PHT): 5.02 cm     SHUNTS MV Area VTI:   1.73 cm     Systemic VTI:  0.18 m MV Peak grad:  13.1 mmHg    Systemic Diam: 2.00 cm MV Mean grad:  6.0 mmHg MV Vmax:       1.81 m/s MV Vmean:      109.0 cm/s MV Decel Time: 151 msec MV E velocity: 103.00 cm/s MV A velocity: 168.00 cm/s MV E/A ratio:  0.61 Rozann Lesches MD Electronically signed by Rozann Lesches MD Signature Date/Time: 01/16/2021/4:08:15 PM    Final    ECHOCARDIOGRAM LIMITED  Result Date: 01/22/2021    ECHOCARDIOGRAM LIMITED REPORT   Patient Name:   [facility] Date  of Exam: 01/22/2021 Medical Rec #:  [id number]     Height:       66.0 in Accession #:    [id number]    Weight:       161.6 lb Date of Birth:  04-Apr-1971     BSA:          1.827 m Patient Age:    17 years      BP:           107/64 mmHg Patient  Gender: F             HR:           88 bpm. Exam Location:  [name] Procedure: Limited Echo Indications:    Pericardial effusion  History:        Patient has prior history of Echocardiogram examinations, most                 recent 01/19/2021. COPD, Arrythmias:PVC, Signs/Symptoms:Chest                 Pain; Risk Factors:Diabetes. COVID +, Tobacco abuse, Sepsis.  Sonographer:    Wenda Low Referring Phys: [phone] DAVID TAT IMPRESSIONS  1. Left ventricular ejection fraction, by estimation, is 60 to 65%. The left ventricle has normal function.  2. Moderate pericardial effusion. The pericardial effusion is circumferential. There is no evidence of cardiac tamponade. Effusion is stable compared to the 01/19/21 study  3. The inferior vena cava is normal in size with greater than 50% respiratory variability, suggesting right atrial pressure of 3 mmHg.  4. Limited echo to evaluate pericardial effusion FINDINGS  Left Ventricle: Left ventricular ejection fraction, by estimation, is 60 to 65%. The left ventricle has normal function. Pericardium: A moderately sized pericardial effusion is present. The pericardial effusion is circumferential. There is no evidence of cardiac tamponade. Venous: The inferior vena cava is normal in size with greater than 50% respiratory variability, suggesting right atrial pressure of 3 mmHg. LEFT VENTRICLE PLAX 2D LVIDd:         3.20 cm LVIDs:         2.40 cm LV PW:         1.20 cm LV IVS:        0.90 cm LVOT diam:     2.10 cm LVOT Area:     3.46 cm  LEFT ATRIUM         Index LA diam:    3.20 cm 1.75 cm/m   AORTA Ao Root diam: 3.40 cm  SHUNTS Systemic Diam: 2.10 cm Carlyle Dolly MD Electronically signed by Carlyle Dolly MD Signature Date/Time: 01/22/2021/11:50:46 AM    Final    ECHOCARDIOGRAM LIMITED  Result Date: 01/19/2021    ECHOCARDIOGRAM LIMITED REPORT   Patient Name:   [facility] Date of Exam: 01/19/2021 Medical Rec #:  [id number]     Height:       66.0 in Accession #:     [id number]    Weight:       161.6 lb Date of Birth:  06-05-1970     BSA:          1.827 m Patient Age:    89 years      BP:           99/68 mmHg Patient Gender: F             HR:           96 bpm. Exam Location:  [name] Procedure:  Limited Echo Indications:    Pericardial effusion  History:        Patient has prior history of Echocardiogram examinations, most                 recent 01/16/2021. COPD, Arrythmias:PVC, Signs/Symptoms:Chest                 Pain; Risk Factors:Diabetes. COVID +, Tobacco Abuse, Sepsis,                 Pericardial effusion.  Sonographer:    Wenda Low Referring Phys: [place]  1. Left ventricular ejection fraction, by estimation, is 60 to 65%. The left ventricle has normal function.  2. Moderate pericardial effusion. The pericardial effusion is circumferential. There is no evidence of cardiac tamponade. Effusion looks larger compared to the 01/16/2021 study however remains overall moderate by measurements. Recommend repeating limited  study in 72 hours. FINDINGS  Left Ventricle: Left ventricular ejection fraction, by estimation, is 60 to 65%. The left ventricle has normal function. Pericardium: A moderately sized pericardial effusion is present. The pericardial effusion is circumferential. There is no evidence of cardiac tamponade. LEFT VENTRICLE PLAX 2D LVIDd:         3.90 cm LVIDs:         2.40 cm LV PW:         1.00 cm LV IVS:        1.00 cm LVOT diam:     2.00 cm LVOT Area:     3.14 cm  LEFT ATRIUM         Index LA diam:    3.40 cm 1.86 cm/m   SHUNTS Systemic Diam: 2.00 cm Carlyle Dolly MD Electronically signed by Carlyle Dolly MD Signature Date/Time: 01/19/2021/11:20:45 AM    Final    [place] EKG SITE RITE  Result Date: 01/16/2021 If Site Rite image not attached, placement could not be confirmed due to current cardiac rhythm. [2 weeks]   Assessment: 50 year old female with history of seizures, GERD, IBS, type 2 diabetes mellitus, COPD presenting  to the ED for seizures.  Patient is a difficult historian.  Her history does not match objective findings.  Presented profoundly dehydrated with creatinine of over 5.  Total CK elevated just under 3000.  Urine drug screen positive for THC, negative for benzos but she reports taking Xanax 3 times daily.  Incidentally tested positive for COVID.  GI consulted for colitis.  Diarrhea: Patient states diarrhea started this admission.  Chronic history of intermittent diarrhea thought to be IBS related.  Gives history of Crohn's diagnosed at [facility] in [facility], records not available.  Subsequent numerous colonoscopies with no evidence of active Crohn's.  This admission with left-sided colitis more pronounced in the rectum.  Gas noted within the wall of the bladder but no bladder wall thickening, potentially related to previous Foley catheter earlier in the admission.  Patient denies symptoms suggestive of colovesical fistula.  Suspect symptoms likely infectious rather than IBD.  Could be secondary to COVID.  Currently on IV Cipro and Flagyl.  White blood cell count down to 16,300 today.  H&H overall stable but has been fluctuating this admission.  No overt GI bleeding. Diarrhea resolved. No abdominal or rectal pain.   Plan: Complete 7 to 10-day course of antibiotics. If she develops rectal pain, abdominal pain she may require repeat imaging to reassess rectal findings on current CT. Consider colonoscopy after she gets over acute illness.  Can be done as an  outpatient with her primary gastroenterologist, Dr. Laural Golden.  If recurrent diarrhea, collect stool studies. Will sign off. Call with questions.   Laureen Ochs. Bernarda Caffey [facility] [phone] 9/23/20221:23 PM    LOS: 7 days

## 2021-01-23 NOTE — Progress Notes (Signed)
Physical Therapy Treatment Patient Details Name: [name] MRN: [id number] DOB: [birth year] Today's Date: 01/23/2021   History of Present Illness [name]  is a 50 y.o. female, with history of COPD, Corhn's, GERD, HTN, T2DM, and more presents to the ED with a chief complaint of seizure.  It is unclear how accurate patient's history is.  She reports that she was sitting on the toilet, attempting to void but could not start a stream, when she had a seizure.  When asked how she knew it was a seizure she reports it is because she has had seizures before.  Patient reports that she is supposed to be on seizure medication but she does not know the moderate the dose.  She reports that she remembers the whole thing.  There was no rhythmic jerking or twitching.  She reports she just knows it was a seizure.  Patient reports that she did not lose consciousness and remembers the whole thing.  Somehow, a "stranger" found her and brought her to the hospital per her report.  I discussed with the patient that her labs would indicate that she was laying on the ground for some time, and asked if she had lost time.  Patient reports that she hit the ground several times yesterday because she found out that her mother and father died at the same time in 2 different locations.  Again the accuracy of this history is unclear.    PT Comments    Patient demonstrates increased strength for completing bed mobility, transferring to commode in bathroom and ambulation in room.  Patient demonstrates slightly increased endurance with occasional stumbling without loss of balance and limited mostly due to fatigue.  Patient will benefit from continued physical therapy in hospital and recommended venue below to increase strength, balance, endurance for safe ADLs and gait.     Recommendations for follow up therapy are one component of a multi-disciplinary discharge planning process, led by the attending physician.  Recommendations may  be updated based on patient status, additional functional criteria and insurance authorization.  Follow Up Recommendations  Home health PT;Supervision for mobility/OOB;Supervision - Intermittent     Equipment Recommendations  None recommended by PT    Recommendations for Other Services       Precautions / Restrictions Precautions Precautions: Fall Restrictions Weight Bearing Restrictions: No     Mobility  Bed Mobility Overal bed mobility: Modified Independent             General bed mobility comments: demonstrates good return for getting into/out of bed    Transfers Overall transfer level: Modified independent   Transfers: Sit to/from Stand;Stand Pivot Transfers Sit to Stand: Modified independent (Device/Increase time) Stand pivot transfers: Modified independent (Device/Increase time)       General transfer comment: good return for transferring to/from commode in bathroom  Ambulation/Gait Ambulation/Gait assistance: Modified independent (Device/Increase time);Supervision Gait Distance (Feet): 65 Feet Assistive device: None Gait Pattern/deviations: Decreased step length - right;Decreased stance time - right;Decreased step length - left;Decreased stride length Gait velocity: decreased   General Gait Details: slightly labored cadence with occasional stumbling without loss of balance, no c/o pain on right foot and limited secondary to fatigue   Stairs             Wheelchair Mobility    Modified Rankin (Stroke Patients Only)       Balance Overall balance assessment: Needs assistance Sitting-balance support: Feet supported;No upper extremity supported Sitting balance-Leahy Scale: Good Sitting balance - Comments: seated at  EOB   Standing balance support: During functional activity;No upper extremity supported Standing balance-Leahy Scale: Fair Standing balance comment: fair without AD                            Cognition  Arousal/Alertness: Awake/alert Behavior During Therapy: WFL for tasks assessed/performed Overall Cognitive Status: Within Functional Limits for tasks assessed                                        Exercises      General Comments        Pertinent Vitals/Pain Pain Assessment: No/denies pain    Home Living                      Prior Function            PT Goals (current goals can now be found in the care plan section) Acute Rehab PT Goals Patient Stated Goal: return home with family to assist PT Goal Formulation: With patient/family Time For Goal Achievement: 01/26/21 Potential to Achieve Goals: Good Progress towards PT goals: Progressing toward goals    Frequency    Min 3X/week      PT Plan Current plan remains appropriate    Co-evaluation              AM-PAC PT "6 Clicks" Mobility   Outcome Measure  Help needed turning from your back to your side while in a flat bed without using bedrails?: None Help needed moving from lying on your back to sitting on the side of a flat bed without using bedrails?: None Help needed moving to and from a bed to a chair (including a wheelchair)?: None Help needed standing up from a chair using your arms (e.g., wheelchair or bedside chair)?: None Help needed to walk in hospital room?: A Little Help needed climbing 3-5 steps with a railing? : A Little 6 Click Score: 22    End of Session   Activity Tolerance: Patient tolerated treatment well;Patient limited by fatigue Patient left: in bed;with call bell/phone within reach Nurse Communication: Mobility status PT Visit Diagnosis: Unsteadiness on feet (R26.81);Other abnormalities of gait and mobility (R26.89);Muscle weakness (generalized) (M62.81)     Time: 1610-9604 PT Time Calculation (min) (ACUTE ONLY): 19 min  Charges:  $Gait Training: 8-22 mins $Therapeutic Activity: 8-22 mins                     3:31 PM, 01/23/21 Lonell Grandchild,  MPT Physical Therapist with [facility] [phone] office [phone] mobile phone

## 2021-01-24 ENCOUNTER — Other Ambulatory Visit: Payer: Self-pay | Admitting: Family Medicine

## 2021-01-24 DIAGNOSIS — I3139 Other pericardial effusion (noninflammatory): Secondary | ICD-10-CM

## 2021-01-24 DIAGNOSIS — N179 Acute kidney failure, unspecified: Secondary | ICD-10-CM | POA: Diagnosis not present

## 2021-01-24 DIAGNOSIS — I313 Pericardial effusion (noninflammatory): Secondary | ICD-10-CM

## 2021-01-24 LAB — CBC
HCT: 36.2 % (ref 36.0–46.0)
Hemoglobin: 11.4 g/dL — ABNORMAL LOW (ref 12.0–15.0)
MCH: 30.9 pg (ref 26.0–34.0)
MCHC: 31.5 g/dL (ref 30.0–36.0)
MCV: 98.1 fL (ref 80.0–100.0)
Platelets: 627 10*3/uL — ABNORMAL HIGH (ref 150–400)
RBC: 3.69 MIL/uL — ABNORMAL LOW (ref 3.87–5.11)
RDW: 15 % (ref 11.5–15.5)
WBC: 15.5 10*3/uL — ABNORMAL HIGH (ref 4.0–10.5)
nRBC: 0 % (ref 0.0–0.2)

## 2021-01-24 LAB — BASIC METABOLIC PANEL
Anion gap: 7 (ref 5–15)
BUN: <5 mg/dL — ABNORMAL LOW (ref 6–20)
CO2: 21 mmol/L — ABNORMAL LOW (ref 22–32)
Calcium: 8.5 mg/dL — ABNORMAL LOW (ref 8.9–10.3)
Chloride: 109 mmol/L (ref 98–111)
Creatinine, Ser: 0.81 mg/dL (ref 0.44–1.00)
GFR, Estimated: >60 mL/min (ref >60)
Glucose, Bld: 106 mg/dL — ABNORMAL HIGH (ref 70–99)
Potassium: 4.3 mmol/L (ref 3.5–5.1)
Sodium: 137 mmol/L (ref 135–145)

## 2021-01-24 MED ORDER — CIPROFLOXACIN HCL 500 MG PO TABS: 500.0000 mg | ORAL_TABLET | Freq: Two times a day (BID) | ORAL | 0 refills | Status: AC

## 2021-01-24 MED ORDER — ALBUTEROL SULFATE HFA 108 (90 BASE) MCG/ACT IN AERS: 2.0000 | INHALATION_SPRAY | RESPIRATORY_TRACT | 1 refills | Status: DC | PRN

## 2021-01-24 MED ORDER — SPIRIVA RESPIMAT 2.5 MCG/ACT IN AERS: 1.0000 | INHALATION_SPRAY | Freq: Every day | RESPIRATORY_TRACT | 1 refills | Status: DC | PRN

## 2021-01-24 MED ORDER — METRONIDAZOLE 500 MG PO TABS: 500.0000 mg | ORAL_TABLET | Freq: Three times a day (TID) | ORAL | 0 refills | Status: AC

## 2021-01-24 MED ORDER — THIAMINE HCL 100 MG PO TABS: 100.0000 mg | ORAL_TABLET | Freq: Every day | ORAL | 3 refills | Status: DC

## 2021-01-24 MED ORDER — ACETAMINOPHEN 325 MG PO TABS: 650.0000 mg | ORAL_TABLET | Freq: Four times a day (QID) | ORAL | 0 refills | Status: DC | PRN

## 2021-01-24 MED ORDER — FOLIC ACID 1 MG PO TABS: 1.0000 mg | ORAL_TABLET | Freq: Every day | ORAL | 2 refills | Status: DC

## 2021-01-24 NOTE — TOC Transition Note (Signed)
Transition of Care [facility]) - CM/SW Discharge Note   Patient Details  Name: [name] MRN: [id number] Date of Birth: 06-30-1970  Transition of Care [facility]) CM/SW Contact:  Natasha Bence, LCSW Phone Number: 01/24/2021, 1:06 PM   Clinical Narrative:    CSW notified of patient's readiness for discharge. CSW notified Marjory Lies with [place]. Marjory Lies agreeable to provide [facility] services upon discharge. TOC signing off.    Final next level of care: [place] Barriers to Discharge: Barriers Resolved   Patient Goals and CMS Choice Patient states their goals for this hospitalization and ongoing recovery are:: Return home with [facility] CMS Medicare.gov Compare Post Acute Care list provided to:: Patient Choice offered to / list presented to : Patient  Discharge Placement                    Patient and family notified of of transfer: 01/24/21  Discharge Plan and Services                          HH Arranged: RN, PT [facility] Agency: [place] Date [place]: 01/24/21 Time [place]: [id number] Representative spoke with at [place]: [place] ([place]) Interventions     Readmission Risk Interventions No flowsheet data found.

## 2021-01-24 NOTE — Discharge Summary (Signed)
[name], is a 50 y.o. female  DOB 09-01-1970  MRN [id number].  Admission date:  01/16/2021  Admitting Physician  Murlean Iba, MD  Discharge Date:  01/24/2021   Primary MD  Teodoro Kil, PA-C  Recommendations for primary care physician for things to follow:   1)Follow-up Gastroenterologist Dr. Hurshel Keys with [name]--- in 1 week for evaluation  -address: [address], [place], [place] [address], Phone: [phone]  2)Avoid ibuprofen/Advil/Aleve/Motrin/Goody Powders/Naproxen/BC powders/Meloxicam/Diclofenac/Indomethacin and other Nonsteroidal anti-inflammatory medications as these will make you more likely to bleed and can cause stomach ulcers, can also cause Kidney problems.   3)Please take antibiotics as prescribed  4)Smoking cessation advised  5)Repeat CBC and BMP test in about a week with the primary care physician or gastroenterologist advised   Admission Diagnosis  AKI (acute kidney injury) ([place]) [N17.9] Severe sepsis ([place]) [A41.9, R65.20]   Discharge Diagnosis  AKI (acute kidney injury) ([place]) [N17.9] Severe sepsis ([place]) [A41.9, R65.20]    Principal Problem:   AKI (acute kidney injury) ([place]) Active Problems:   GERD (gastroesophageal reflux disease)   Diabetes mellitus without complication (HCC)   Tobacco abuse   Rhabdomyolysis   Lactic acidosis   COVID-19 virus infection   Severe sepsis (HCC)   Moderate Pericardial effusion   Right foot pain   Acute metabolic encephalopathy   Diarrhea      Past Medical History:  Diagnosis Date   Anxiety    Asthma    Chronic low back pain    Collagen vascular disease (HCC)    COPD (chronic obstructive pulmonary disease) (HCC)    Crohn's disease ([place])    GERD (gastroesophageal reflux disease)    EGD 01/2007 by Dr.Rourke small hiatal hernia s/p 56 french maloney    History of head injury     Hypertension    IBS (irritable bowel syndrome)    PSVT (paroxysmal supraventricular tachycardia) (HCC)    PTSD (post-traumatic stress disorder)    Recurrent chest pain    Seizure disorder ([place])    Type 2 diabetes mellitus ([place])     Past Surgical History:  Procedure Laterality Date   ABDOMINAL HYSTERECTOMY     with right salpingo oophorectomy 2005   APPENDECTOMY  2006   CESAREAN SECTION     1996   CHOLECYSTECTOMY     COLONOSCOPY  2010   Dr. Gala Romney; negative except for hemorrhoids   ESOPHAGEAL DILATION N/A 10/25/2014   Procedure: ESOPHAGEAL DILATION;  Surgeon: Rogene Houston, MD;  [facility];  Service: Endoscopy;  Laterality: N/A;   ESOPHAGOGASTRODUODENOSCOPY N/A 10/25/2014   Procedure: ESOPHAGOGASTRODUODENOSCOPY (EGD);  Surgeon: Rogene Houston, MD;  Location: [facility];  Service: Endoscopy;  Laterality: N/A;  1250   OOPHORECTOMY     left for torsion and ovarian fibroma; uterine myoma resected 1995      HPI  from the history and physical done on the day of admission:    [name]  is a 50 y.o. female, with history of COPD, Corhn's,  GERD, HTN, T2DM, and more presents to the ED with a chief complaint of seizure.  It is unclear how accurate patient's history is.  She reports that she was sitting on the toilet, attempting to void but could not start a stream, when she had a seizure.  When asked how she knew it was a seizure she reports it is because she has had seizures before.  Patient reports that she is supposed to be on seizure medication but she does not know the moderate the dose.  She reports that she remembers the whole thing.  There was no rhythmic jerking or twitching.  She reports she just knows it was a seizure.  Patient reports that she did not lose consciousness and remembers the whole thing.  Somehow, a "stranger" found her and brought her to the hospital per her report.  I discussed with the patient that her labs would indicate that she was laying on the  ground for some time, and asked if she had lost time.  Patient reports that she hit the ground several times yesterday because she found out that her mother and father died at the same time in 2 different locations.  Again the accuracy of this history is unclear.     Patient reports that she does smoke, does not drink, does not use illicit drugs.  She is not vaccinated for COVID.  She is full code.   In the ED Temp 98.5, heart rate 94-1 09, respiratory 14-27, blood pressure 94/51 at admission but had been as low as 65/40 Leukocytosis 13.5, hemoglobin 13.9 Chemistry panel reveals an AKI with a BUN of 42 and a creatinine of 5.36-baseline is around 1 Patient had undetectable Tylenol level, undetectable aspirin level, undetectable alcohol level After admission COVID came back positive Alk phos slightly elevated at 195, AST minimally elevated at 79, ALT 38 CPK 2980 CT head shows no acute intracranial abnormality Chest x-ray shows no active disease UA is not indicative of an infection UDS positive for THC, patient has consistently been positive for benzos in the past and has a prescription for Xanax 3 times daily no benzos in this urine Patient was given 3 L bolus, and continued on LR 150 ED provider requests admission for observation in the setting of dehydration AKI     Hospital Course:     Sepsis Due to colitis--POA -presented with leukocytosis, tachycardia, AMS  -CT abdomen and pelvis shows distal colitis, extending from the splenic flexure of the colon into the rectum. The walls of the distal rectum are ill-defined, and rectal perforation difficult to exclude. No drainable fluid collectionidentified. -Presented with markedly elevated procalcitonin level and elevated lactic acid level was empirically started on IV antibiotics broad-spectrum at time of admission -WBC trending back down, abdominal pain, frequency and volume of stools improved -discharge home on Cipro and Flagyl -blood  cultures neg -CXR--neg for consolidation -UA no pyuria follow up Gastroenterologist as advised   Abdominal pain/Loose Stools>>Colitis -01/21/21 CT abd/pelvis--distal colitis, extending from the splenic flexure of the colon into the rectum;  walls of the distal rectum are ill-defined, and rectal perforation difficult to exclude. No drainable fluid collection identified. -review of medical records showed pt reports "possible" dx of Crohn's diagnosed 40 at [facility] but numerous studies including colonoscopy (05/24/2008) which showed normal terminal ileum and SBFT (07/26/2011) which showed normal SB have questioned if this was a true diagnosis -Given initial clinical improvement on IV abx and questionable Crohn's diagnosis>>restart abx (cipro/flagyl) GI consult appreciated  recommends continue antibiotics for about a week or so -unable to Obtain stool sample for stool pathogen panel as diarrhea resolved -Unable Obtain stool sample for stool calprotectin as diarrhea resolved -Lactic acid improved to 2.0 from 4.8   Acute metabolic encephalopathy - -Likely secondary to postictal state from withdrawal seizure and AKI --overall improved with supportive measures -01/21/21--significant other at bedside states pt is near baseline -CT did not show any acute abnormalities. -EEG unrevealing epileptiform activity noted -d/c Elavil and Atarax -UDS-->positive THC -01/16/21 VBG--no hypercarbia - B12--701 -folate--17.9 -TSH--2.063 -ammonia--22 -9/16 UA--no pyuria   Xanax withdrawal seizure -appreciate neurology consult -PDMP reviewed--pt receives xanax 1 mg, #90 monthly -restarted xanax -she apparently ran out 3-4 days prior to admission and significant other found her lethargic on floor -Avoid abrupt discontinuation of benzos     AKI -serum creatinine peaked 5.36 -baseline creatinine 0.8-0.9 -improved with IVF -Appreciate nephrology consultation and recommendations   COVID  infection -currently asymptomatic-following with supportive measures -stable on RA   Moderate pericardial effusion  - repeat Echo done 9/19 slightly larger effusion noted - Cardiology rec from Dr. Harl Bowie noted -repeat echo 01/22/21 reasuring   Nontraumatic Rhabdomyolysis -CK 2980>>49 -improved with IVF   Right foot pain  - soft tissue injury only, xray images with no finding of fracture   Hypomagnesemia -repleted    Dispo: The patient is from: Home              Anticipated d/c is to: Home                  Family Communication:   significant other updated at bedside, dc instructions reviewed  Consultants:  renal, neurology   Code Status:  FULL     Discharge Condition:- stable  Follow UP   Follow-up Information     Eloise Harman, DO Follow up.   Specialty: Gastroenterology Contact information: [address] [place] [address] [phone]                 Diet and Activity recommendation:  As advised  Discharge Instructions    Discharge Instructions     Call MD for:  difficulty breathing, headache or visual disturbances   Complete by: As directed    Call MD for:  persistant dizziness or light-headedness   Complete by: As directed    Call MD for:  persistant nausea and vomiting   Complete by: As directed    Call MD for:  temperature >100.4   Complete by: As directed    Diet - low sodium heart healthy   Complete by: As directed    Discharge instructions   Complete by: As directed    1)Follow-up Gastroenterologist Dr. Hurshel Keys with [facility]--- in 1 week for evaluation  -address: [address], [place], [place] [address], Phone: [phone]  2)Avoid ibuprofen/Advil/Aleve/Motrin/Goody Powders/Naproxen/BC powders/Meloxicam/Diclofenac/Indomethacin and other Nonsteroidal anti-inflammatory medications as these will make you more likely to bleed and can cause stomach ulcers, can also cause Kidney problems.   3)Please  take antibiotics as prescribed  4)Smoking cessation advised  5)Repeat CBC and BMP test in about a week with the primary care physician or gastroenterologist advised   Increase activity slowly   Complete by: As directed        Discharge Medications     Allergies as of 01/24/2021       Reactions   Codeine Shortness Of Breath, Swelling, Rash   Throat swelling   Dilaudid [hydromorphone Hcl] Shortness Of Breath, Swelling  Hydrocodone Shortness Of Breath, Swelling, Rash   Morphine And Related Shortness Of Breath, Swelling, Rash   Oxycodone Shortness Of Breath, Swelling, Rash   Patient states ALL pain medications make her deathly sick.   Penicillins Shortness Of Breath, Swelling, Other (See Comments)   Has patient had a PCN reaction causing immediate rash, facial/tongue/throat swelling, SOB or lightheadedness with hypotension: Yes Has patient had a PCN reaction causing severe rash involving mucus membranes or skin necrosis: Yes Has patient had a PCN reaction that required hospitalization Yes Has patient had a PCN reaction occurring within the last 10 years: No If all of the above answers are "NO", then may proceed with Cephalosporin use. Throat swells   Acetaminophen    Per MD patient states she can't take Tylenol because of liver enzymes   Ativan [lorazepam] Other (See Comments)   Migraines.   Strawberry Extract Swelling   Watermelon Concentrate Swelling   Benadryl [diphenhydramine Hcl] Palpitations   Latex Rash        Medication List     STOP taking these medications    pregabalin 100 MG capsule Commonly known as: LYRICA       TAKE these medications    acetaminophen 325 MG tablet Commonly known as: TYLENOL Take 2 tablets (650 mg total) by mouth every 6 (six) hours as needed for mild pain or fever (or Fever >/= 101).   albuterol 108 (90 Base) MCG/ACT inhaler Commonly known as: VENTOLIN HFA Inhale 2 puffs into the lungs every 4 (four) hours as needed for wheezing  or shortness of breath.   ALPRAZolam 1 MG tablet Commonly known as: XANAX Take 1 mg by mouth 3 (three) times daily as needed for anxiety.   amitriptyline 75 MG tablet Commonly known as: ELAVIL Take 75 mg by mouth at bedtime.   cetirizine 10 MG tablet Commonly known as: ZYRTEC Take 10 mg by mouth daily.   ciprofloxacin 500 MG tablet Commonly known as: Cipro Take 1 tablet (500 mg total) by mouth 2 (two) times daily for 5 days.   cyclobenzaprine 10 MG tablet Commonly known as: FLEXERIL Take 10 mg by mouth 3 (three) times daily as needed for muscle spasms.   diphenoxylate-atropine 2.5-0.025 MG tablet Commonly known as: Lomotil Take 1 tablet 4 (four) times daily as needed by mouth for diarrhea or loose stools.   DULoxetine 30 MG capsule Commonly known as: CYMBALTA Take 30 mg by mouth daily.   ezetimibe 10 MG tablet Commonly known as: ZETIA Take 10 mg by mouth daily.   folic acid 1 MG tablet Commonly known as: FOLVITE Take 1 tablet (1 mg total) by mouth daily. Start taking on: January 25, 2021   gabapentin 300 MG capsule Commonly known as: NEURONTIN Take 300 mg by mouth 3 (three) times daily.   hydrOXYzine 25 MG tablet Commonly known as: ATARAX/VISTARIL Take 1 tablet by mouth at bedtime.   levETIRAcetam 750 MG tablet Commonly known as: KEPPRA Take 750 mg by mouth 2 (two) times daily.   metroNIDAZOLE 500 MG tablet Commonly known as: FLAGYL Take 1 tablet (500 mg total) by mouth 3 (three) times daily for 5 days.   nitroGLYCERIN 0.4 MG SL tablet Commonly known as: NITROSTAT Place 1 tablet (0.4 mg total) under the tongue every 5 (five) minutes as needed for chest pain.   omeprazole 40 MG capsule Commonly known as: PRILOSEC Take 40 mg by mouth daily.   ondansetron 8 MG disintegrating tablet Commonly known as: Zofran ODT Take 1 tablet (8  mg total) every 8 (eight) hours as needed by mouth for nausea or vomiting.   promethazine 25 MG tablet Commonly known as:  PHENERGAN Take 1 tablet (25 mg total) by mouth every 6 (six) hours as needed for nausea or vomiting.   Restasis 0.05 % ophthalmic emulsion Generic drug: cycloSPORINE Place 1 drop into both eyes 2 (two) times daily.   Spiriva Respimat 2.5 MCG/ACT Aers Generic drug: Tiotropium Bromide Monohydrate Inhale 1 puff into the lungs daily as needed (shortness of breath).   thiamine 100 MG tablet Take 1 tablet (100 mg total) by mouth daily. Start taking on: January 25, 2021   topiramate 25 MG tablet Commonly known as: TOPAMAX Take 1 tablet by mouth in the morning and at bedtime.        Major procedures and Radiology Reports - PLEASE review detailed and final reports for all details, in brief -   CT HEAD WO CONTRAST (5MM)  Result Date: 01/16/2021 CLINICAL DATA:  Status post seizure. EXAM: CT HEAD WITHOUT CONTRAST TECHNIQUE: Contiguous axial images were obtained from the base of the skull through the vertex without intravenous contrast. COMPARISON:  June 03, 2020 FINDINGS: Brain: No evidence of acute infarction, hemorrhage, hydrocephalus, extra-axial collection or mass lesion/mass effect. Vascular: No hyperdense vessel or unexpected calcification. Skull: Normal. Negative for fracture or focal lesion. Sinuses/Orbits: A small right maxillary sinus air-fluid level is seen. Other: None. IMPRESSION: 1. No acute intracranial abnormality. Electronically Signed   By: Virgina Norfolk M.D.   On: 01/16/2021 01:08   CT ABDOMEN PELVIS W CONTRAST  Result Date: 01/21/2021 CLINICAL DATA:  Abdominal pain, acute nonlocalized fever, leukocytosis. Abdominal abscess/infection suspected. COVID positive. History of COPD, collagen vascular disease and Crohn's disease. EXAM: CT ABDOMEN AND PELVIS WITH CONTRAST TECHNIQUE: Multidetector CT imaging of the abdomen and pelvis was performed using the standard protocol following bolus administration of intravenous contrast. CONTRAST:  63m OMNIPAQUE IOHEXOL 350 MG/ML SOLN  COMPARISON:  CT 09/14/2015. FINDINGS: Lower chest: New small to moderate dependent pleural effusions with associated bibasilar atelectasis at both lung bases. There is a small pericardial effusion as well. The heart size is normal. No acute vascular findings are evident. Hepatobiliary: The liver is normal in density without suspicious focal abnormality. No significant biliary dilatation status post cholecystectomy. Pancreas: Unremarkable. No pancreatic ductal dilatation or surrounding inflammatory changes. Spleen: Normal in size without focal abnormality. Adrenals/Urinary Tract: Both adrenal glands appear normal. Both kidneys appear normal. No evidence of urinary tract calculus, hydronephrosis or perinephric soft tissue stranding. The urinary bladder is moderately distended. There is a small amount of air within the bladder lumen. No bladder wall thickening or immediate surrounding inflammatory changes identified. Stomach/Bowel: Enteric contrast was administered and has passed into the mid small bowel. The stomach appears normal for its degree of distention. There is no significant small bowel distension, wall thickening or surrounding inflammation. By report, the patient is status post appendectomy. The proximal colon is fluid-filled and mildly distended. There is diffuse wall thickening of the colon extending from the splenic flexure to the rectum. Wall thickening and enhancement are most prominent within the proximal rectum. Distally, the rectal walls are less well-defined, and distal rectal perforation is difficult to exclude. There is moderate perirectal soft tissue stranding. These inflammatory changes do not appear to directly abut the posterior wall of the bladder. No discrete fistula are seen. Vascular/Lymphatic: There are no enlarged abdominal or pelvic lymph nodes. No acute vascular findings. The portal, superior mesenteric and splenic veins are patent.  Reproductive: Hysterectomy. No adnexal mass. There  is no gas within the vagina. Other: Small amount of pelvic ascites. As above, possible perirectal fluid without drainable fluid collection or fistula. No other extraluminal fluid collections are identified. There is no free air. Musculoskeletal: No acute or significant osseous findings. No evidence of discitis or sacroiliitis. IMPRESSION: 1. Findings are consistent with distal colitis, extending from the splenic flexure of the colon into the rectum. This colitis is presumably secondary to the patient's Crohn's disease. 2. The walls of the distal rectum are ill-defined, and rectal perforation difficult to exclude. No drainable fluid collection identified. 3. Gas within the wall of the bladder without bladder wall thickening or immediate surrounding inflammatory changes. The gas could be iatrogenic, although if catheterization has not been recently performed, a colovesical fistula would be a consideration. 4. No evidence of small-bowel inflammation or obstruction. 5. Bilateral pleural effusions with small pericardial effusion and bibasilar atelectasis. 6. These results will be called to the ordering clinician or representative by the Radiologist Assistant, and communication documented in the PACS or [employer]. Electronically Signed   By: Richardean Sale M.D.   On: 01/21/2021 16:45   US RENAL  Result Date: 01/16/2021 CLINICAL DATA:  Acute kidney injury EXAM: RENAL / URINARY TRACT ULTRASOUND COMPLETE COMPARISON:  None. FINDINGS: Right Kidney: Renal measurements: 10.7 x 5.0 x 5.5 cm = volume: 156 mL. Echogenicity within normal limits. No mass or hydronephrosis visualized. Left Kidney: Renal measurements: 10.3 x 6.0 x 5.3 cm = volume: 172 mL. Echogenicity within normal limits. No mass or hydronephrosis visualized. Bladder: Appears normal for degree of bladder distention. Bilateral ureteral jets are not noted, and the patient is not able to void at the time of the examination. Other: None. IMPRESSION: 1.  Unremarkable sonographic appearance of the kidneys. No hydronephrosis. 2. Bilateral ureteral jets are not noted, and the patient is not able to void at the time of the examination. Correlate for urinary retention. Electronically Signed   By: Eddie Candle M.D.   On: 01/16/2021 13:04   US Carotid Bilateral  Result Date: 01/16/2021 CLINICAL DATA:  Altered mental status, seizures EXAM: BILATERAL CAROTID DUPLEX ULTRASOUND TECHNIQUE: Pearline Cables scale imaging, color Doppler and duplex ultrasound were performed of bilateral carotid and vertebral arteries in the neck. COMPARISON:  None. FINDINGS: Criteria: Quantification of carotid stenosis is based on velocity parameters that correlate the residual internal carotid diameter with NASCET-based stenosis levels, using the diameter of the distal internal carotid lumen as the denominator for stenosis measurement. The following velocity measurements were obtained: RIGHT ICA: 127/43 cm/sec CCA: 681/27 cm/sec SYSTOLIC ICA/CCA RATIO:  0.9 ECA:  118 cm/sec LEFT ICA: 145/41 cm/sec CCA: 517/00 cm/sec SYSTOLIC ICA/CCA RATIO:  1.1 ECA:  107 cm/sec RIGHT CAROTID ARTERY: No significant atherosclerotic plaque or evidence of stenosis in the internal carotid artery. RIGHT VERTEBRAL ARTERY:  Patent with normal antegrade flow. LEFT CAROTID ARTERY: Trace smooth heterogeneous atherosclerotic plaque in the proximal internal carotid artery. By peak systolic velocity criteria in the region of plaque, the estimated stenosis is less than 50%. LEFT VERTEBRAL ARTERY:  Patent with normal antegrade flow. IMPRESSION: 1. Mild (1-49%) stenosis proximal left internal carotid artery secondary to trace smooth heterogeneous atherosclerotic plaque. 2. No significant atherosclerotic plaque or evidence of stenosis in the right internal carotid artery. 3. Vertebral arteries are patent with normal antegrade flow. Signed, Criselda Peaches, MD, [facility]  Electronically Signed   By: Dellis Filbert.D.  On: 01/16/2021 11:00   DG CHEST PORT 1 VIEW  Result Date: 01/17/2021 CLINICAL DATA:  Severe sepsis.  Patient is COVID-19 positive. EXAM: PORTABLE CHEST 1 VIEW COMPARISON:  January 16, 2021 FINDINGS: Heart, hila, mediastinum are normal. No pneumothorax. No nodules or masses. Minimal haziness in the left costophrenic angle, likely atelectasis. No suspicious infiltrates are identified. IMPRESSION: Minimal haziness in the left costophrenic angle is favored to represent atelectasis. No other abnormalities are identified. No suspicious infiltrates. Electronically Signed   By: Dorise Bullion III M.D.   On: 01/17/2021 08:13   DG Chest Port 1 View  Result Date: 01/16/2021 CLINICAL DATA:  Seizure. EXAM: PORTABLE CHEST 1 VIEW COMPARISON:  May 09, 2016 FINDINGS: The heart size and mediastinal contours are within normal limits. Both lungs are clear. The visualized skeletal structures are unremarkable. IMPRESSION: No active disease. Electronically Signed   By: Virgina Norfolk M.D.   On: 01/16/2021 01:02   DG Foot 2 Views Right  Result Date: 01/19/2021 CLINICAL DATA:  Recent fall with bruising EXAM: RIGHT FOOT - 2 VIEW COMPARISON:  None. FINDINGS: There is no evidence of fracture or dislocation. There is no evidence of arthropathy or other focal bone abnormality. Soft tissues are unremarkable. IMPRESSION: Negative. Electronically Signed   By: Jorje Guild M.D.   On: 01/19/2021 11:14   EEG adult  Result Date: 01/16/2021 Lora Havens, MD     01/16/2021 10:16 PM Patient Name: [name] MRN: [id number] Epilepsy Attending: Lora Havens Referring Physician/Provider: Dr Clearence Ped, [place] B Date: 01/16/2021 Duration: 22.32 mins Patient history: 50 yo F with AMS and metabolic changes, Likely due to xanax withdrawal seizures. EEG to evaluate for seizure Level of alertness: Awake AEDs during EEG study: LEV, GBP Technical aspects: This EEG study was  done with scalp electrodes positioned according to the 10-20 International system of electrode placement. Electrical activity was acquired at a sampling rate of 500Hz and reviewed with a high frequency filter of 70Hz and a low frequency filter of 1Hz. EEG data were recorded continuously and digitally stored. Description: The posterior dominant rhythm consists of 9 Hz activity of moderate voltage (25-35 uV) seen predominantly in posterior head regions, symmetric and reactive to eye opening and eye closing. EEG showed intermittent generalized 5 to 6 Hz theta slowing. Hyperventilation and photic stimulation were not performed.   ABNORMALITY - Intermittent slow, generalized IMPRESSION: This study is suggestive of mild diffuse encephalopathy, nonspecific etiology. No seizures or epileptiform discharges were seen throughout the recording. Lora Havens   ECHOCARDIOGRAM COMPLETE  Result Date: 01/16/2021    ECHOCARDIOGRAM REPORT   Patient Name:   [name] Date of Exam: 01/16/2021 Medical Rec #:  [id number]     Height:       66.0 in Accession #:    [id number]    Weight:       145.0 lb Date of Birth:  02/15/1971     BSA:          1.744 m Patient Age:    66 years      BP:           81/20 mmHg Patient Gender: F             HR:           111 bpm. Exam Location:  [name] Procedure: 2D Echo, Cardiac Doppler and Color Doppler Indications:    Syncope  History:        Patient has prior history of Echocardiogram  examinations, most                 recent 07/11/2010. COPD, Signs/Symptoms:Dyspnea; Risk                 Factors:Tobacco abuse and Diabetes. COVID +.  Sonographer:    Wenda Low Referring Phys: ASIA [facility] [place]  1. Left ventricular ejection fraction, by estimation, is 70 to 75%. The left ventricle has hyperdynamic function. The left ventricle has no regional wall motion abnormalities. There is mild left ventricular hypertrophy. Left ventricular diastolic parameters are indeterminate.  2. Right  ventricular systolic function is normal. The right ventricular size is normal. Tricuspid regurgitation signal is inadequate for assessing PA pressure.  3. No right atrial or right ventricular collapse to suggest tamponade physiology. Respiratory variation in mitral outflow not assessed. Presence of sinus tachycardia does raise concern however for potential hemodynamic significance. Follow-up clinically  and suggest repeat limited echocardiogram within tht next 48 hours. Moderate pericardial effusion. The pericardial effusion is circumferential.  4. The mitral valve is grossly normal. Mild mitral valve regurgitation.  5. The aortic valve is tricuspid. Aortic valve regurgitation is not visualized. No aortic stenosis is present. Aortic valve mean gradient measures 6.0 mmHg.  6. The inferior vena cava is normal in size with <50% respiratory variability, suggesting right atrial pressure of 8 mmHg. Comparison(s): No prior Echocardiogram. FINDINGS  Left Ventricle: Left ventricular ejection fraction, by estimation, is 70 to 75%. The left ventricle has hyperdynamic function. The left ventricle has no regional wall motion abnormalities. The left ventricular internal cavity size was normal in size. There is mild left ventricular hypertrophy. Left ventricular diastolic parameters are indeterminate. Right Ventricle: The right ventricular size is normal. No increase in right ventricular wall thickness. Right ventricular systolic function is normal. Tricuspid regurgitation signal is inadequate for assessing PA pressure. Left Atrium: Left atrial size was normal in size. Right Atrium: Right atrial size was normal in size. Pericardium: No right atrial or right ventricular collapse to suggest tamponade physiology. Respiratory variation in mitral outflow not assessed. Presence of sinus tachycardia does raise concern however for potential hemodynamic significance. Follow-up clinically and suggest repeat limited echocardiogram within tht  next 48 hours. A moderately sized pericardial effusion is present. The pericardial effusion is circumferential. Mitral Valve: The mitral valve is grossly normal. Mild mitral valve regurgitation. MV peak gradient, 13.1 mmHg. The mean mitral valve gradient is 6.0 mmHg. Tricuspid Valve: The tricuspid valve is grossly normal. Tricuspid valve regurgitation is trivial. Aortic Valve: The aortic valve is tricuspid. Aortic valve regurgitation is not visualized. No aortic stenosis is present. Aortic valve mean gradient measures 6.0 mmHg. Aortic valve peak gradient measures 10.8 mmHg. Aortic valve area, by VTI measures 2.42  cm. Pulmonic Valve: The pulmonic valve was grossly normal. Pulmonic valve regurgitation is trivial. Aorta: The aortic root is normal in size and structure. Venous: The inferior vena cava is normal in size with less than 50% respiratory variability, suggesting right atrial pressure of 8 mmHg. IAS/Shunts: No atrial level shunt detected by color flow Doppler.  LEFT VENTRICLE PLAX 2D LVIDd:         3.70 cm  Diastology LVIDs:         2.40 cm  LV e' medial:    9.17 cm/s LV PW:         1.10 cm  LV E/e' medial:  11.2 LV IVS:        1.10 cm  LV e' lateral:   9.17 cm/s  LVOT diam:     2.00 cm  LV E/e' lateral: 11.2 LV SV:         58 LV SV Index:   33 LVOT Area:     3.14 cm  RIGHT VENTRICLE RV Basal diam:  2.30 cm RV Mid diam:    2.00 cm RV S prime:     27.70 cm/s TAPSE (M-mode): 3.3 cm LEFT ATRIUM             Index       RIGHT ATRIUM           Index LA diam:        3.30 cm 1.89 cm/m  RA Area:     10.80 cm LA Vol (A2C):   41.8 ml 23.96 ml/m RA Volume:   17.70 ml  10.15 ml/m LA Vol (A4C):   34.7 ml 19.89 ml/m LA Biplane Vol: 38.5 ml 22.07 ml/m  AORTIC VALVE AV Area (Vmax):    2.47 cm AV Area (Vmean):   2.20 cm AV Area (VTI):     2.42 cm AV Vmax:           164.00 cm/s AV Vmean:          109.000 cm/s AV VTI:            0.240 m AV Peak Grad:      10.8 mmHg AV Mean Grad:      6.0 mmHg LVOT Vmax:         129.00  cm/s LVOT Vmean:        76.400 cm/s LVOT VTI:          0.185 m LVOT/AV VTI ratio: 0.77  AORTA Ao Root diam: 3.20 cm MITRAL VALVE MV Area (PHT): 5.02 cm     SHUNTS MV Area VTI:   1.73 cm     Systemic VTI:  0.18 m MV Peak grad:  13.1 mmHg    Systemic Diam: 2.00 cm MV Mean grad:  6.0 mmHg MV Vmax:       1.81 m/s MV Vmean:      109.0 cm/s MV Decel Time: 151 msec MV E velocity: 103.00 cm/s MV A velocity: 168.00 cm/s MV E/A ratio:  0.61 Rozann Lesches MD Electronically signed by Rozann Lesches MD Signature Date/Time: 01/16/2021/4:08:15 PM    Final    ECHOCARDIOGRAM LIMITED  Result Date: 01/22/2021    ECHOCARDIOGRAM LIMITED REPORT   Patient Name:   [name] Date of Exam: 01/22/2021 Medical Rec #:  [id number]     Height:       66.0 in Accession #:    [id number]    Weight:       161.6 lb Date of Birth:  [birth year]     BSA:          1.827 m Patient Age:    25 years      BP:           107/64 mmHg Patient Gender: F             HR:           88 bpm. Exam Location:  [name] Procedure: Limited Echo Indications:    Pericardial effusion  History:        Patient has prior history of Echocardiogram examinations, most                 recent 01/19/2021. COPD, Arrythmias:PVC, Signs/Symptoms:Chest  Pain; Risk Factors:Diabetes. COVID +, Tobacco abuse, Sepsis.  Sonographer:    Wenda Low Referring Phys: [phone] DAVID TAT IMPRESSIONS  1. Left ventricular ejection fraction, by estimation, is 60 to 65%. The left ventricle has normal function.  2. Moderate pericardial effusion. The pericardial effusion is circumferential. There is no evidence of cardiac tamponade. Effusion is stable compared to the 01/19/21 study  3. The inferior vena cava is normal in size with greater than 50% respiratory variability, suggesting right atrial pressure of 3 mmHg.  4. Limited echo to evaluate pericardial effusion FINDINGS  Left Ventricle: Left ventricular ejection fraction, by estimation, is 60 to 65%. The left ventricle has normal  function. Pericardium: A moderately sized pericardial effusion is present. The pericardial effusion is circumferential. There is no evidence of cardiac tamponade. Venous: The inferior vena cava is normal in size with greater than 50% respiratory variability, suggesting right atrial pressure of 3 mmHg. LEFT VENTRICLE PLAX 2D LVIDd:         3.20 cm LVIDs:         2.40 cm LV PW:         1.20 cm LV IVS:        0.90 cm LVOT diam:     2.10 cm LVOT Area:     3.46 cm  LEFT ATRIUM         Index LA diam:    3.20 cm 1.75 cm/m   AORTA Ao Root diam: 3.40 cm  SHUNTS Systemic Diam: 2.10 cm Carlyle Dolly MD Electronically signed by Carlyle Dolly MD Signature Date/Time: 01/22/2021/11:50:46 AM    Final    ECHOCARDIOGRAM LIMITED  Result Date: 01/19/2021    ECHOCARDIOGRAM LIMITED REPORT   Patient Name:   [name] Date of Exam: 01/19/2021 Medical Rec #:  [id number]     Height:       66.0 in Accession #:    [id number]    Weight:       161.6 lb Date of Birth:  [birth year]     BSA:          1.827 m Patient Age:    28 years      BP:           99/68 mmHg Patient Gender: F             HR:           96 bpm. Exam Location:  [name] Procedure: Limited Echo Indications:    Pericardial effusion  History:        Patient has prior history of Echocardiogram examinations, most                 recent 01/16/2021. COPD, Arrythmias:PVC, Signs/Symptoms:Chest                 Pain; Risk Factors:Diabetes. COVID +, Tobacco Abuse, Sepsis,                 Pericardial effusion.  Sonographer:    Wenda Low Referring Phys: [place]  1. Left ventricular ejection fraction, by estimation, is 60 to 65%. The left ventricle has normal function.  2. Moderate pericardial effusion. The pericardial effusion is circumferential. There is no evidence of cardiac tamponade. Effusion looks larger compared to the 01/16/2021 study however remains overall moderate by measurements. Recommend repeating limited  study in 72 hours. FINDINGS   Left Ventricle: Left ventricular ejection fraction, by estimation, is 60 to 65%. The left ventricle has normal function. Pericardium: A  moderately sized pericardial effusion is present. The pericardial effusion is circumferential. There is no evidence of cardiac tamponade. LEFT VENTRICLE PLAX 2D LVIDd:         3.90 cm LVIDs:         2.40 cm LV PW:         1.00 cm LV IVS:        1.00 cm LVOT diam:     2.00 cm LVOT Area:     3.14 cm  LEFT ATRIUM         Index LA diam:    3.40 cm 1.86 cm/m   SHUNTS Systemic Diam: 2.00 cm Carlyle Dolly MD Electronically signed by Carlyle Dolly MD Signature Date/Time: 01/19/2021/11:20:45 AM    Final    [place] EKG SITE RITE  Result Date: 01/16/2021 If Site Rite image not attached, placement could not be confirmed due to current cardiac rhythm.   Micro Results   Recent Results (from the past 240 hour(s))  Resp Panel by RT-PCR (Flu A&B, Covid) Nasopharyngeal Swab     Status: Abnormal   Collection Time: 01/16/21  2:32 AM   Specimen: Nasopharyngeal Swab; Nasopharyngeal(NP) swabs in vial transport medium  Result Value Ref Range Status   SARS Coronavirus 2 by RT PCR POSITIVE (A) NEGATIVE Final    Comment: RESULT CALLED TO, READ BACK BY AND VERIFIED WITH: HEARN,J_[email or web address]  by Matthews,b 9.16.22 (NOTE) SARS-CoV-2 target nucleic acids are DETECTED.  The SARS-CoV-2 RNA is generally detectable in upper respiratory specimens during the acute phase of infection. Positive results are indicative of the presence of the identified virus, but do not rule out bacterial infection or co-infection with other pathogens not detected by the test. Clinical correlation with patient history and other diagnostic information is necessary to determine patient infection status. The expected result is Negative.  Fact Sheet for Patients: EntrepreneurPulse.com.au  Fact Sheet for Healthcare Providers: IncredibleEmployment.be  This test is not yet approved or  cleared by the [place] FDA and  has been authorized for detection and/or diagnosis of SARS-CoV-2 by FDA under an Emergency Use Authorization (EUA).  This EUA will remain in effect (meaning this test can b e used) for the duration of  the COVID-19 declaration under Section 564(b)(1) of the Act, 21 U.S.C. section 360bbb-3(b)(1), unless the authorization is terminated or revoked sooner.     Influenza A by PCR NEGATIVE NEGATIVE Final   Influenza B by PCR NEGATIVE NEGATIVE Final    Comment: (NOTE) The Xpert Xpress SARS-CoV-2/FLU/RSV plus assay is intended as an aid in the diagnosis of influenza from Nasopharyngeal swab specimens and should not be used as a sole basis for treatment. Nasal washings and aspirates are unacceptable for Xpert Xpress SARS-CoV-2/FLU/RSV testing.  Fact Sheet for Patients: EntrepreneurPulse.com.au  Fact Sheet for Healthcare Providers: IncredibleEmployment.be  This test is not yet approved or cleared by the [place] FDA and has been authorized for detection and/or diagnosis of SARS-CoV-2 by FDA under an Emergency Use Authorization (EUA). This EUA will remain in effect (meaning this test can be used) for the duration of the COVID-19 declaration under Section 564(b)(1) of the Act, 21 U.S.C. section 360bbb-3(b)(1), unless the authorization is terminated or revoked.  Performed at [facility], [address] [place], [place] [address]   MRSA Next Gen by PCR, Nasal     Status: None   Collection Time: 01/16/21  5:11 AM   Specimen: Nasal Mucosa; Nasal Swab  Result Value Ref Range Status   MRSA by PCR Next Gen  NOT DETECTED NOT DETECTED Final    Comment: (NOTE) The GeneXpert MRSA Assay (FDA approved for NASAL specimens only), is one component of a comprehensive MRSA colonization surveillance program. It is not intended to diagnose MRSA infection nor to guide or monitor treatment for MRSA infections. Test performance is  not FDA approved in patients less than 69 years old. Performed at [facility], [address] [place], [place]  [address]   Culture, blood (routine x 2)     Status: None   Collection Time: 01/16/21  8:24 AM   Specimen: BLOOD RIGHT ARM  Result Value Ref Range Status   Specimen Description   Final    BLOOD RIGHT ARM BOTTLES DRAWN AEROBIC AND ANAEROBIC   Special Requests Blood Culture adequate volume  Final   Culture   Final    NO GROWTH 5 DAYS Performed at [facility], [address], [place], [place] [address]    Report Status 01/21/2021 FINAL  Final    Today   Subjective    [name] today has no new complaints  No further diarrhea  Eating/drinking ok Boyfriend at bedside No fever  Or chills   No Nausea, Vomiting or Diarrhea        Patient has been seen and examined prior to discharge   Objective   Blood pressure (!) 101/58, pulse 95, temperature 98.4 F (36.9 C), temperature source Oral, resp. rate 14, height 5' 6" (1.676 m), weight 73.3 kg, SpO2 95 %.   Intake/Output Summary (Last 24 hours) at 01/24/2021 1352 Last data filed at 01/24/2021 1300 Gross per 24 hour  Intake 2200.07 ml  Output 1 ml  Net 2199.07 ml    Exam Gen:- Awake Alert, no acute distress  HEENT:- [place].AT, No sclera icterus Neck-Supple Neck,No JVD,.  Lungs-  CTAB , good air movement bilaterally  CV- S1, S2 normal, regular Abd-  +ve B.Sounds, Abd Soft, No tenderness,    Extremity/Skin:- No  edema,   good pulses Psych-affect is appropriate, oriented x3 Neuro-no new focal deficits, no tremors    Data Review   CBC w Diff:  Lab Results  Component Value Date   WBC 15.5 (H) 01/24/2021   HGB 11.4 (L) 01/24/2021   HCT 36.2 01/24/2021   PLT 627 (H) 01/24/2021   LYMPHOPCT 16 01/22/2021   BANDSPCT 8 01/22/2021   MONOPCT 8 01/22/2021   EOSPCT 1 01/22/2021   BASOPCT 0 01/22/2021    CMP:  Lab Results  Component Value Date   NA 137 01/24/2021   K 4.3 01/24/2021   CL 109 01/24/2021   CO2  21 (L) 01/24/2021   BUN <5 (L) 01/24/2021   CREATININE 0.81 01/24/2021   PROT 6.0 (L) 01/22/2021   ALBUMIN 2.3 (L) 01/23/2021   BILITOT 0.2 (L) 01/22/2021   ALKPHOS 208 (H) 01/22/2021   AST 56 (H) 01/22/2021   ALT 33 01/22/2021  .   Total Discharge time is about 33 minutes  Roxan Hockey M.D on 01/24/2021 at 1:52 PM  Go to [email or web address] -  for contact info  [facility] - Office  [phone]

## 2021-01-24 NOTE — Discharge Instructions (Signed)
1)Follow-up Gastroenterologist Dr. Hurshel Keys with [facility]--- in 1 week for evaluation  -address: [address] [place], [place] [address], Phone: [phone]  2)Avoid ibuprofen/Advil/Aleve/Motrin/Goody Powders/Naproxen/BC powders/Meloxicam/Diclofenac/Indomethacin and other Nonsteroidal anti-inflammatory medications as these will make you more likely to bleed and can cause stomach ulcers, can also cause Kidney problems.   3)Please take antibiotics as prescribed  4)Smoking cessation advised  5)Repeat CBC and BMP test in about a week with the primary care physician or gastroenterologist advised

## 2021-01-24 NOTE — Progress Notes (Signed)
Nsg Discharge Note  Admit Date:  01/16/2021 Discharge date: 01/24/2021   Christyne Satre to be D/C'd Home per MD order.  AVS completed.   Patient able to verbalize understanding.  Discharge Medication: Allergies as of 01/24/2021       Reactions   Codeine Shortness Of Breath, Swelling, Rash   Throat swelling   Dilaudid [hydromorphone Hcl] Shortness Of Breath, Swelling   Hydrocodone Shortness Of Breath, Swelling, Rash   Morphine And Related Shortness Of Breath, Swelling, Rash   Oxycodone Shortness Of Breath, Swelling, Rash   Patient states ALL pain medications make her deathly sick.   Penicillins Shortness Of Breath, Swelling, Other (See Comments)   Has patient had a PCN reaction causing immediate rash, facial/tongue/throat swelling, SOB or lightheadedness with hypotension: Yes Has patient had a PCN reaction causing severe rash involving mucus membranes or skin necrosis: Yes Has patient had a PCN reaction that required hospitalization Yes Has patient had a PCN reaction occurring within the last 10 years: No If all of the above answers are "NO", then may proceed with Cephalosporin use. Throat swells   Acetaminophen    Per MD patient states she can't take Tylenol because of liver enzymes   Ativan [lorazepam] Other (See Comments)   Migraines.   Strawberry Extract Swelling   Watermelon Concentrate Swelling   Benadryl [diphenhydramine Hcl] Palpitations   Latex Rash        Medication List     STOP taking these medications    pregabalin 100 MG capsule Commonly known as: LYRICA       TAKE these medications    acetaminophen 325 MG tablet Commonly known as: TYLENOL Take 2 tablets (650 mg total) by mouth every 6 (six) hours as needed for mild pain or fever (or Fever >/= 101).   albuterol 108 (90 Base) MCG/ACT inhaler Commonly known as: VENTOLIN HFA Inhale 2 puffs into the lungs every 4 (four) hours as needed for wheezing or shortness of breath.   ALPRAZolam 1 MG  tablet Commonly known as: XANAX Take 1 mg by mouth 3 (three) times daily as needed for anxiety.   amitriptyline 75 MG tablet Commonly known as: ELAVIL Take 75 mg by mouth at bedtime.   cetirizine 10 MG tablet Commonly known as: ZYRTEC Take 10 mg by mouth daily.   ciprofloxacin 500 MG tablet Commonly known as: Cipro Take 1 tablet (500 mg total) by mouth 2 (two) times daily for 5 days.   cyclobenzaprine 10 MG tablet Commonly known as: FLEXERIL Take 10 mg by mouth 3 (three) times daily as needed for muscle spasms.   diphenoxylate-atropine 2.5-0.025 MG tablet Commonly known as: Lomotil Take 1 tablet 4 (four) times daily as needed by mouth for diarrhea or loose stools.   DULoxetine 30 MG capsule Commonly known as: CYMBALTA Take 30 mg by mouth daily.   ezetimibe 10 MG tablet Commonly known as: ZETIA Take 10 mg by mouth daily.   folic acid 1 MG tablet Commonly known as: FOLVITE Take 1 tablet (1 mg total) by mouth daily. Start taking on: January 25, 2021   gabapentin 300 MG capsule Commonly known as: NEURONTIN Take 300 mg by mouth 3 (three) times daily.   hydrOXYzine 25 MG tablet Commonly known as: ATARAX/VISTARIL Take 1 tablet by mouth at bedtime.   levETIRAcetam 750 MG tablet Commonly known as: KEPPRA Take 750 mg by mouth 2 (two) times daily.   metroNIDAZOLE 500 MG tablet Commonly known as: FLAGYL Take 1 tablet (500 mg total) by  mouth 3 (three) times daily for 5 days.   nitroGLYCERIN 0.4 MG SL tablet Commonly known as: NITROSTAT Place 1 tablet (0.4 mg total) under the tongue every 5 (five) minutes as needed for chest pain.   omeprazole 40 MG capsule Commonly known as: PRILOSEC Take 40 mg by mouth daily.   ondansetron 8 MG disintegrating tablet Commonly known as: Zofran ODT Take 1 tablet (8 mg total) every 8 (eight) hours as needed by mouth for nausea or vomiting.   promethazine 25 MG tablet Commonly known as: PHENERGAN Take 1 tablet (25 mg total) by  mouth every 6 (six) hours as needed for nausea or vomiting.   Restasis 0.05 % ophthalmic emulsion Generic drug: cycloSPORINE Place 1 drop into both eyes 2 (two) times daily.   Spiriva Respimat 2.5 MCG/ACT Aers Generic drug: Tiotropium Bromide Monohydrate Inhale 1 puff into the lungs daily as needed (shortness of breath).   thiamine 100 MG tablet Take 1 tablet (100 mg total) by mouth daily. Start taking on: January 25, 2021   topiramate 25 MG tablet Commonly known as: TOPAMAX Take 1 tablet by mouth in the morning and at bedtime.        Discharge Assessment: Vitals:   01/23/21 2052 01/24/21 0512  BP: (!) 117/55 (!) 101/58  Pulse: 95 95  Resp: 15 14  Temp: 97.8 F (36.6 C) 98.4 F (36.9 C)  SpO2: 96% 95%   Skin clean, dry and intact without evidence of skin break down, no evidence of skin tears noted. IV catheter discontinued intact. Site without signs and symptoms of complications - no redness or edema noted at insertion site, patient denies c/o pain - only slight tenderness at site.  Dressing with slight pressure applied.  D/c Instructions-Education: Discharge instructions given to patient with verbalized understanding. D/c education completed with patient including follow up instructions, medication list, d/c activities limitations if indicated, with other d/c instructions as indicated by MD - patient able to verbalize understanding, all questions fully answered. Patient instructed to return to ED, call 911, or call MD for any changes in condition.  Patient escorted via [place], and D/C home via private auto.  Berton Bon, RN 01/24/2021 3:02 PM

## 2021-01-24 NOTE — Progress Notes (Signed)
HH orders placed

## 2021-01-27 ENCOUNTER — Ambulatory Visit (HOSPITAL_COMMUNITY): Admission: RE | Admit: 2021-01-27 | Payer: 59 | Source: Ambulatory Visit

## 2021-02-03 ENCOUNTER — Inpatient Hospital Stay (HOSPITAL_COMMUNITY)
Admission: EM | Admit: 2021-02-03 | Discharge: 2021-02-22 | DRG: 386 | Disposition: A | Discharge status: Home / Self Care | Payer: 59 | Attending: Internal Medicine | Admitting: Internal Medicine

## 2021-02-03 ENCOUNTER — Encounter (HOSPITAL_COMMUNITY): Payer: Self-pay

## 2021-02-03 ENCOUNTER — Emergency Department (HOSPITAL_COMMUNITY): Payer: 59

## 2021-02-03 ENCOUNTER — Other Ambulatory Visit: Payer: Self-pay

## 2021-02-03 DIAGNOSIS — G40909 Epilepsy, unspecified, not intractable, without status epilepticus: Secondary | ICD-10-CM | POA: Diagnosis present

## 2021-02-03 DIAGNOSIS — Z880 Allergy status to penicillin: Secondary | ICD-10-CM | POA: Diagnosis not present

## 2021-02-03 DIAGNOSIS — Z91040 Latex allergy status: Secondary | ICD-10-CM | POA: Diagnosis not present

## 2021-02-03 DIAGNOSIS — E860 Dehydration: Secondary | ICD-10-CM | POA: Diagnosis present

## 2021-02-03 DIAGNOSIS — D62 Acute posthemorrhagic anemia: Secondary | ICD-10-CM | POA: Diagnosis present

## 2021-02-03 DIAGNOSIS — R140 Abdominal distension (gaseous): Secondary | ICD-10-CM

## 2021-02-03 DIAGNOSIS — K56699 Other intestinal obstruction unspecified as to partial versus complete obstruction: Secondary | ICD-10-CM

## 2021-02-03 DIAGNOSIS — M545 Low back pain, unspecified: Secondary | ICD-10-CM | POA: Diagnosis present

## 2021-02-03 DIAGNOSIS — K5190 Ulcerative colitis, unspecified, without complications: Secondary | ICD-10-CM | POA: Diagnosis present

## 2021-02-03 DIAGNOSIS — F1210 Cannabis abuse, uncomplicated: Secondary | ICD-10-CM | POA: Diagnosis present

## 2021-02-03 DIAGNOSIS — Z79899 Other long term (current) drug therapy: Secondary | ICD-10-CM | POA: Diagnosis not present

## 2021-02-03 DIAGNOSIS — E876 Hypokalemia: Secondary | ICD-10-CM | POA: Diagnosis present

## 2021-02-03 DIAGNOSIS — K6389 Other specified diseases of intestine: Secondary | ICD-10-CM | POA: Diagnosis not present

## 2021-02-03 DIAGNOSIS — Z8249 Family history of ischemic heart disease and other diseases of the circulatory system: Secondary | ICD-10-CM

## 2021-02-03 DIAGNOSIS — K921 Melena: Secondary | ICD-10-CM | POA: Diagnosis present

## 2021-02-03 DIAGNOSIS — F17200 Nicotine dependence, unspecified, uncomplicated: Secondary | ICD-10-CM | POA: Diagnosis not present

## 2021-02-03 DIAGNOSIS — Z833 Family history of diabetes mellitus: Secondary | ICD-10-CM

## 2021-02-03 DIAGNOSIS — I3139 Other pericardial effusion (noninflammatory): Secondary | ICD-10-CM | POA: Diagnosis present

## 2021-02-03 DIAGNOSIS — E119 Type 2 diabetes mellitus without complications: Secondary | ICD-10-CM | POA: Diagnosis present

## 2021-02-03 DIAGNOSIS — Z888 Allergy status to other drugs, medicaments and biological substances status: Secondary | ICD-10-CM | POA: Diagnosis not present

## 2021-02-03 DIAGNOSIS — F4310 Post-traumatic stress disorder, unspecified: Secondary | ICD-10-CM | POA: Diagnosis present

## 2021-02-03 DIAGNOSIS — K529 Noninfective gastroenteritis and colitis, unspecified: Secondary | ICD-10-CM

## 2021-02-03 DIAGNOSIS — K5900 Constipation, unspecified: Secondary | ICD-10-CM | POA: Diagnosis not present

## 2021-02-03 DIAGNOSIS — R197 Diarrhea, unspecified: Secondary | ICD-10-CM | POA: Diagnosis present

## 2021-02-03 DIAGNOSIS — R296 Repeated falls: Secondary | ICD-10-CM | POA: Diagnosis present

## 2021-02-03 DIAGNOSIS — K219 Gastro-esophageal reflux disease without esophagitis: Secondary | ICD-10-CM | POA: Diagnosis present

## 2021-02-03 DIAGNOSIS — K50118 Crohn's disease of large intestine with other complication: Secondary | ICD-10-CM | POA: Diagnosis present

## 2021-02-03 DIAGNOSIS — I10 Essential (primary) hypertension: Secondary | ICD-10-CM | POA: Diagnosis present

## 2021-02-03 DIAGNOSIS — R109 Unspecified abdominal pain: Secondary | ICD-10-CM

## 2021-02-03 DIAGNOSIS — Z885 Allergy status to narcotic agent status: Secondary | ICD-10-CM

## 2021-02-03 DIAGNOSIS — J449 Chronic obstructive pulmonary disease, unspecified: Secondary | ICD-10-CM | POA: Diagnosis present

## 2021-02-03 DIAGNOSIS — K50112 Crohn's disease of large intestine with intestinal obstruction: Secondary | ICD-10-CM | POA: Diagnosis present

## 2021-02-03 DIAGNOSIS — Z8616 Personal history of COVID-19: Secondary | ICD-10-CM

## 2021-02-03 DIAGNOSIS — K5010 Crohn's disease of large intestine without complications: Secondary | ICD-10-CM | POA: Diagnosis present

## 2021-02-03 DIAGNOSIS — U071 COVID-19: Secondary | ICD-10-CM | POA: Diagnosis not present

## 2021-02-03 DIAGNOSIS — G8929 Other chronic pain: Secondary | ICD-10-CM | POA: Diagnosis present

## 2021-02-03 DIAGNOSIS — Z20822 Contact with and (suspected) exposure to covid-19: Secondary | ICD-10-CM | POA: Diagnosis present

## 2021-02-03 DIAGNOSIS — F17210 Nicotine dependence, cigarettes, uncomplicated: Secondary | ICD-10-CM | POA: Diagnosis present

## 2021-02-03 DIAGNOSIS — R1084 Generalized abdominal pain: Secondary | ICD-10-CM | POA: Diagnosis not present

## 2021-02-03 DIAGNOSIS — Z91018 Allergy to other foods: Secondary | ICD-10-CM | POA: Diagnosis not present

## 2021-02-03 DIAGNOSIS — R300 Dysuria: Secondary | ICD-10-CM | POA: Diagnosis present

## 2021-02-03 DIAGNOSIS — Z91199 Patient's noncompliance with other medical treatment and regimen due to unspecified reason: Secondary | ICD-10-CM

## 2021-02-03 LAB — COMPREHENSIVE METABOLIC PANEL
ALT: 29 U/L (ref 0–44)
AST: 41 U/L (ref 15–41)
Albumin: 3.7 g/dL (ref 3.5–5.0)
Alkaline Phosphatase: 121 U/L (ref 38–126)
Anion gap: 10 (ref 5–15)
BUN: 8 mg/dL (ref 6–20)
CO2: 27 mmol/L (ref 22–32)
Calcium: 9.5 mg/dL (ref 8.9–10.3)
Chloride: 96 mmol/L — ABNORMAL LOW (ref 98–111)
Creatinine, Ser: 0.86 mg/dL (ref 0.44–1.00)
GFR, Estimated: >60 mL/min (ref >60)
Glucose, Bld: 118 mg/dL — ABNORMAL HIGH (ref 70–99)
Potassium: 3.2 mmol/L — ABNORMAL LOW (ref 3.5–5.1)
Sodium: 133 mmol/L — ABNORMAL LOW (ref 135–145)
Total Bilirubin: 0.4 mg/dL (ref 0.3–1.2)
Total Protein: 7.6 g/dL (ref 6.5–8.1)

## 2021-02-03 LAB — CBC
HCT: 40.9 % (ref 36.0–46.0)
Hemoglobin: 13.4 g/dL (ref 12.0–15.0)
MCH: 30.2 pg (ref 26.0–34.0)
MCHC: 32.8 g/dL (ref 30.0–36.0)
MCV: 92.3 fL (ref 80.0–100.0)
Platelets: 729 10*3/uL — ABNORMAL HIGH (ref 150–400)
RBC: 4.43 MIL/uL (ref 3.87–5.11)
RDW: 13.3 % (ref 11.5–15.5)
WBC: 8.8 10*3/uL (ref 4.0–10.5)
nRBC: 0 % (ref 0.0–0.2)

## 2021-02-03 LAB — TYPE AND SCREEN
ABO/RH(D): B POS
Antibody Screen: NEGATIVE

## 2021-02-03 LAB — POC OCCULT BLOOD, ED: Fecal Occult Bld: POSITIVE — AB

## 2021-02-03 LAB — I-STAT CHEM 8, ED
BUN: 7 mg/dL (ref 6–20)
Calcium, Ion: 1.12 mmol/L — ABNORMAL LOW (ref 1.15–1.40)
Chloride: 96 mmol/L — ABNORMAL LOW (ref 98–111)
Creatinine, Ser: 0.7 mg/dL (ref 0.44–1.00)
Glucose, Bld: 120 mg/dL — ABNORMAL HIGH (ref 70–99)
HCT: 42 % (ref 36.0–46.0)
Hemoglobin: 14.3 g/dL (ref 12.0–15.0)
Potassium: 3.2 mmol/L — ABNORMAL LOW (ref 3.5–5.1)
Sodium: 135 mmol/L (ref 135–145)
TCO2: 28 mmol/L (ref 22–32)

## 2021-02-03 LAB — CBG MONITORING, ED: Glucose-Capillary: 116 mg/dL — ABNORMAL HIGH (ref 70–99)

## 2021-02-03 LAB — C DIFFICILE QUICK SCREEN W PCR REFLEX
C Diff antigen: NEGATIVE
C Diff interpretation: NOT DETECTED
C Diff toxin: NEGATIVE

## 2021-02-03 LAB — ETHANOL: Alcohol, Ethyl (B): <10 mg/dL (ref <10)

## 2021-02-03 LAB — HCG, QUANTITATIVE, PREGNANCY: hCG, Beta Chain, Quant, S: 9 m[IU]/mL — ABNORMAL HIGH (ref <5)

## 2021-02-03 LAB — MAGNESIUM: Magnesium: 1.6 mg/dL — ABNORMAL LOW (ref 1.7–2.4)

## 2021-02-03 MED ORDER — INSULIN ASPART 100 UNIT/ML IJ SOLN: 0.0000 [IU] | INTRAMUSCULAR | Status: DC

## 2021-02-03 MED ORDER — TIOTROPIUM BROMIDE MONOHYDRATE 2.5 MCG/ACT IN AERS: 1.0000 | INHALATION_SPRAY | Freq: Every day | RESPIRATORY_TRACT | Status: DC | PRN

## 2021-02-03 MED ORDER — IPRATROPIUM-ALBUTEROL 0.5-2.5 (3) MG/3ML IN SOLN: 3.0000 mL | RESPIRATORY_TRACT | Status: DC | PRN

## 2021-02-03 MED ORDER — METRONIDAZOLE 500 MG/100ML IV SOLN
500.0000 mg | Freq: Two times a day (BID) | INTRAVENOUS | Status: DC
  Administered 2021-02-04 – 2021-02-05 (×5): 500 mg via INTRAVENOUS
  Filled 2021-02-03 (×5): qty 100

## 2021-02-03 MED ORDER — INSULIN ASPART 100 UNIT/ML IJ SOLN: 0.0000 [IU] | Freq: Three times a day (TID) | INTRAMUSCULAR | Status: DC

## 2021-02-03 MED ORDER — METRONIDAZOLE 500 MG/100ML IV SOLN: 500.0000 mg | Freq: Once | INTRAVENOUS | Status: DC

## 2021-02-03 MED ORDER — MAGNESIUM SULFATE 2 GM/50ML IV SOLN
2.0000 g | Freq: Once | INTRAVENOUS | Status: AC
  Administered 2021-02-03: 2 g via INTRAVENOUS
  Filled 2021-02-03: qty 50

## 2021-02-03 MED ORDER — ALPRAZOLAM 1 MG PO TABS
1.0000 mg | ORAL_TABLET | Freq: Three times a day (TID) | ORAL | Status: DC
  Administered 2021-02-03 – 2021-02-22 (×56): 1 mg via ORAL
  Filled 2021-02-03 (×10): qty 1
  Filled 2021-02-03 (×3): qty 2
  Filled 2021-02-03 (×3): qty 1
  Filled 2021-02-03: qty 2
  Filled 2021-02-03: qty 1
  Filled 2021-02-03: qty 2
  Filled 2021-02-03 (×19): qty 1
  Filled 2021-02-03: qty 2
  Filled 2021-02-03 (×6): qty 1
  Filled 2021-02-03: qty 2
  Filled 2021-02-03: qty 1
  Filled 2021-02-03: qty 2
  Filled 2021-02-03: qty 1
  Filled 2021-02-03: qty 2
  Filled 2021-02-03 (×6): qty 1

## 2021-02-03 MED ORDER — INSULIN ASPART 100 UNIT/ML IJ SOLN
0.0000 [IU] | Freq: Every day | INTRAMUSCULAR | Status: DC
Start: 2021-02-03 — End: 2021-02-05

## 2021-02-03 MED ORDER — METRONIDAZOLE 500 MG/100ML IV SOLN
500.0000 mg | Freq: Once | INTRAVENOUS | Status: AC
  Administered 2021-02-03: 500 mg via INTRAVENOUS
  Filled 2021-02-03: qty 100

## 2021-02-03 MED ORDER — SODIUM CHLORIDE 0.9 % IV BOLUS
1000.0000 mL | Freq: Once | INTRAVENOUS | Status: AC
  Administered 2021-02-03: 1000 mL via INTRAVENOUS

## 2021-02-03 MED ORDER — PANTOPRAZOLE SODIUM 40 MG PO TBEC
40.0000 mg | DELAYED_RELEASE_TABLET | Freq: Every day | ORAL | Status: DC
  Administered 2021-02-03 – 2021-02-22 (×20): 40 mg via ORAL
  Filled 2021-02-03 (×20): qty 1

## 2021-02-03 MED ORDER — IOHEXOL 300 MG/ML  SOLN
100.0000 mL | Freq: Once | INTRAMUSCULAR | Status: AC | PRN
  Administered 2021-02-03: 100 mL via INTRAVENOUS

## 2021-02-03 MED ORDER — HYDROCORTISONE (PERIANAL) 2.5 % EX CREA
TOPICAL_CREAM | Freq: Two times a day (BID) | CUTANEOUS | Status: DC
  Administered 2021-02-07 – 2021-02-21 (×7): 1 via RECTAL
  Filled 2021-02-03 (×3): qty 28.35

## 2021-02-03 MED ORDER — LEVETIRACETAM 500 MG/5ML IV SOLN
INTRAVENOUS | Status: AC
  Filled 2021-02-03: qty 10

## 2021-02-03 MED ORDER — CIPROFLOXACIN IN D5W 400 MG/200ML IV SOLN
400.0000 mg | Freq: Once | INTRAVENOUS | Status: AC
  Administered 2021-02-03: 400 mg via INTRAVENOUS
  Filled 2021-02-03: qty 200

## 2021-02-03 MED ORDER — POTASSIUM CHLORIDE IN NACL 40-0.9 MEQ/L-% IV SOLN
INTRAVENOUS | Status: AC
  Filled 2021-02-03 (×3): qty 1000

## 2021-02-03 MED ORDER — ONDANSETRON HCL 4 MG/2ML IJ SOLN: 4.0000 mg | Freq: Four times a day (QID) | INTRAMUSCULAR | Status: DC | PRN

## 2021-02-03 MED ORDER — ACETAMINOPHEN 650 MG RE SUPP
650.0000 mg | Freq: Four times a day (QID) | RECTAL | Status: DC | PRN
  Filled 2021-02-03: qty 1

## 2021-02-03 MED ORDER — ACETAMINOPHEN 325 MG PO TABS
650.0000 mg | ORAL_TABLET | Freq: Four times a day (QID) | ORAL | Status: DC | PRN
  Administered 2021-02-03 – 2021-02-22 (×34): 650 mg via ORAL
  Filled 2021-02-03 (×35): qty 2

## 2021-02-03 MED ORDER — ONDANSETRON HCL 4 MG PO TABS: 4.0000 mg | ORAL_TABLET | Freq: Four times a day (QID) | ORAL | Status: DC | PRN

## 2021-02-03 MED ORDER — CIPROFLOXACIN IN D5W 400 MG/200ML IV SOLN
400.0000 mg | Freq: Two times a day (BID) | INTRAVENOUS | Status: DC
  Administered 2021-02-04: 400 mg via INTRAVENOUS
  Filled 2021-02-03: qty 200

## 2021-02-03 MED ORDER — SODIUM CHLORIDE 0.9 % IV SOLN
750.0000 mg | Freq: Two times a day (BID) | INTRAVENOUS | Status: DC
  Administered 2021-02-03 – 2021-02-08 (×10): 750 mg via INTRAVENOUS
  Filled 2021-02-03 (×12): qty 7.5

## 2021-02-03 NOTE — ED Notes (Signed)
This RN attempted 2 times to get second IV on pt per MD orders. Pt requested we don't attempt any more IVs at this time. She wants to wait and get some IV fluids through the first IV in hopes that her veins will then be better to attempt a second IV again.

## 2021-02-03 NOTE — ED Notes (Signed)
Multiple unsuccessful attempts to get a second PIV. Pt refusing to get stuck any more for another IV at this time.
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
## 2021-02-03 NOTE — ED Notes (Signed)
Per MD, no need to reswab for COVID since pt was COVID positive on 9/16

## 2021-02-03 NOTE — Consult Note (Addendum)
@LOGO @   Referring Provider: Dr. Milton Ferguson Primary Care Physician:  Teodoro Kil, PA-C Primary Gastroenterologist:  Dr. Laural Golden  Date of Admission: 02/03/2021 Date of Consultation: 02/03/2021  Reason for Consultation: Colitis, rectal bleeding.   HPI:  [name] is a 50 y.o. year old female with history of COPD, HTN, type 2 diabetes, GERD, IBS, and ?  Diagnosis of Crohn's in 1998 at [facility], but numerous studies including colonoscopies and imaging since then without evidence of active Crohn's who presented to the emergency room with chief complaint of rectal bleeding x1 week.  She was recently admitted to the hospital 9/16-9/24 with AKI, sepsis thought to be secondary to colitis noted on CT extending from splenic flexure to the rectum.  She was treated empirically with antibiotics and diarrhea resolved.  No stool studies completed during admission, but recommended stool studies if diarrhea returns.  She was discharged on a 5-day course of Cipro and Flagyl.  She had also reported seizure at presentation though EEG unrevealing.  Elavil and Atarax were discontinued.  Suspect was secondary to Xanax withdrawal.  ED Course:  Hypertensive on presentation with BP 157/137, tachycardic with pulse 128.  Afebrile, O2 saturation 95% on room air. CBC with hemoglobin 13.4, WBC 8.8, platelets 729. CMP with hyponatremia 133, hypokalemia 3.2, kidney function and LFTs within normal limits. Fecal occult blood positive. CT A/P with contrast with slightly improved but persistent distal colitis.  Partially visualized slightly improved trace pericardial effusion, resolution of bilateral pleural effusions. She received IV fluids and was started on Cipro and Flagyl.  GI consulted for rectal bleeding and colitis.  Today:  Started passing bright red blood in her stools about 7 days ago. Daily with every BM.  Blood is only noted on toilet tissue and a small volume.  Denies rectal pain, burning, or history  of hemorrhoids.  Also noticed a couple stools that were completely black.  Denies Pepto-Bismol or iron.  Having at least 3-4 Bms daily as well as nocturnal stools. Diarrhea resumed 2 days after hospital discharge.  She only took about 3 doses of her antibiotics as they worsened nausea and vomiting.  Admits to abdominal pain, primarily right sided lower abdominal pain. Not constant. Sharp. Worse prior to Bms, but not improved by Bms. Last a couple hours. Not worsened by meals.   Nausea daily with intermittent vomiting, total of 4-5 episodes of emesis since discharge. No hematemesis or coffee ground emesis.  N/V is chronic for years. Previously related to constipation and improved if bowels moved. Since acute illness, this has been more of a constant issue. Reports blood sugar is under good control. No significant issue with early satiety.   Prior to her acute illness with diarrhea, she has chronic history of alternating constipation and diarrhea.  Ibuprofen 2-3 times a week- 600 mg. Usually for HA, joint pain, or abdominal pain.   History of GERD. Taking 2-3 omeprazole at a time PRN. 3-4 days a week.   Denies fever, chills, significant joint pain, rashes, mouth ulcers.  Regarding her prior diagnosis of Crohn's disease, she denies being on treatment in the past.  Cousins with history of Crohns. ?Father with history of Crohn's. Father passed from pancreatic and liver cancer.   Past Medical History:  Diagnosis Date   Anxiety    Asthma    Chronic low back pain    Collagen vascular disease ([place])    COPD (chronic obstructive pulmonary disease) (HCC)    Crohn's disease ([place])  GERD (gastroesophageal reflux disease)    EGD 01/2007 by Dr.Rourke small hiatal hernia s/p 56 french maloney    History of head injury    Hypertension    IBS (irritable bowel syndrome)    PSVT (paroxysmal supraventricular tachycardia) (HCC)    PTSD (post-traumatic stress disorder)    Recurrent chest pain    Seizure  disorder ([place])    Type 2 diabetes mellitus ([place])     Past Surgical History:  Procedure Laterality Date   ABDOMINAL HYSTERECTOMY     with right salpingo oophorectomy 2005   APPENDECTOMY  2006   CESAREAN SECTION     1996   CHOLECYSTECTOMY     COLONOSCOPY  2010   Dr. Gala Romney; negative except for hemorrhoids   ESOPHAGEAL DILATION N/A 10/25/2014   Procedure: ESOPHAGEAL DILATION;  Surgeon: Rogene Houston, MD;  Location: [facility];  Service: Endoscopy;  Laterality: N/A;   ESOPHAGOGASTRODUODENOSCOPY N/A 10/25/2014   Procedure: ESOPHAGOGASTRODUODENOSCOPY (EGD);  Surgeon: Rogene Houston, MD;  Location: [facility];  Service: Endoscopy;  Laterality: N/A;  1250   OOPHORECTOMY     left for torsion and ovarian fibroma; uterine myoma resected 1995    Prior to Admission medications   Medication Sig Start Date End Date Taking? Authorizing Provider  acetaminophen (TYLENOL) 325 MG tablet Take 2 tablets (650 mg total) by mouth every 6 (six) hours as needed for mild pain or fever (or Fever >/= 101). 01/24/21  Yes Emokpae, Courage, MD  albuterol (VENTOLIN HFA) 108 (90 Base) MCG/ACT inhaler Inhale 2 puffs into the lungs every 4 (four) hours as needed for wheezing or shortness of breath. 01/24/21  Yes Emokpae, Courage, MD  ALPRAZolam [name]) 1 MG tablet Take 1 mg by mouth 3 (three) times daily as needed for anxiety. 07/11/19  Yes [provider]  amitriptyline (ELAVIL) 75 MG tablet Take 75 mg by mouth at bedtime. 12/16/20  Yes [provider]  cetirizine (ZYRTEC) 10 MG tablet Take 10 mg by mouth daily. 03/30/19  Yes [provider]  cyclobenzaprine (FLEXERIL) 10 MG tablet Take 10 mg by mouth 3 (three) times daily as needed for muscle spasms. 07/11/19  Yes [provider]  diphenoxylate-atropine (LOMOTIL) 2.5-0.025 MG tablet Take 1 tablet 4 (four) times daily as needed by mouth for diarrhea or loose stools. 03/17/17  Yes Idol, Almyra Free, PA-C  DULoxetine (CYMBALTA) 30 MG capsule  Take 30 mg by mouth daily. 12/15/20  Yes [provider]  ezetimibe (ZETIA) 10 MG tablet Take 10 mg by mouth daily. 12/15/20  Yes [provider]  gabapentin (NEURONTIN) 300 MG capsule Take 300 mg by mouth 3 (three) times daily. 11/20/20  Yes [provider]  hydrOXYzine (ATARAX/VISTARIL) 25 MG tablet Take 1 tablet by mouth at bedtime. 07/11/19  Yes [provider]  levETIRAcetam (KEPPRA) 750 MG tablet Take 750 mg by mouth 2 (two) times daily. 09/30/20  Yes [provider]  omeprazole (PRILOSEC) 40 MG capsule Take 40 mg by mouth daily. 07/21/19  Yes [provider]  ondansetron (ZOFRAN ODT) 8 MG disintegrating tablet Take 1 tablet (8 mg total) every 8 (eight) hours as needed by mouth for nausea or vomiting. 03/17/17  Yes Idol, Almyra Free, PA-C  promethazine (PHENERGAN) 25 MG tablet Take 1 tablet (25 mg total) by mouth every 6 (six) hours as needed for nausea or vomiting. 07/30/19  Yes Noemi Chapel, MD  RESTASIS 0.05 % ophthalmic emulsion Place 1 drop into both eyes 2 (two) times daily. 12/12/20  Yes [provider]  SPIRIVA RESPIMAT 2.5 MCG/ACT AERS Inhale 1 puff into the lungs daily as needed (shortness of breath). 01/24/21  Yes Emokpae, Courage, MD  topiramate (TOPAMAX) 25 MG tablet Take 1 tablet by mouth in the morning and at bedtime. 10/29/20  Yes [provider]  folic acid (FOLVITE) 1 MG tablet Take 1 tablet (1 mg total) by mouth daily. Patient not taking: No sig reported 01/25/21   Roxan Hockey, MD  nitroGLYCERIN (NITROSTAT) 0.4 MG SL tablet Place 1 tablet (0.4 mg total) under the tongue every 5 (five) minutes as needed for chest pain. 04/15/15   Herminio Commons, MD  thiamine 100 MG tablet Take 1 tablet (100 mg total) by mouth daily. Patient not taking: No sig reported 01/25/21   Roxan Hockey, MD    Current Facility-Administered Medications  Medication Dose Route Frequency Provider Last Rate Last Admin   ciprofloxacin  (CIPRO) IVPB 400 mg  400 mg Intravenous Once Milton Ferguson, MD 200 mL/hr at 02/03/21 1519 400 mg at 02/03/21 1519   Current Outpatient Medications  Medication Sig Dispense Refill   acetaminophen (TYLENOL) 325 MG tablet Take 2 tablets (650 mg total) by mouth every 6 (six) hours as needed for mild pain or fever (or Fever >/= 101). 12 tablet 0   albuterol (VENTOLIN HFA) 108 (90 Base) MCG/ACT inhaler Inhale 2 puffs into the lungs every 4 (four) hours as needed for wheezing or shortness of breath. 18 g 1   ALPRAZolam (XANAX) 1 MG tablet Take 1 mg by mouth 3 (three) times daily as needed for anxiety.     amitriptyline (ELAVIL) 75 MG tablet Take 75 mg by mouth at bedtime.     cetirizine (ZYRTEC) 10 MG tablet Take 10 mg by mouth daily.     cyclobenzaprine (FLEXERIL) 10 MG tablet Take 10 mg by mouth 3 (three) times daily as needed for muscle spasms.     diphenoxylate-atropine (LOMOTIL) 2.5-0.025 MG tablet Take 1 tablet 4 (four) times daily as needed by mouth for diarrhea or loose stools. 15 tablet 0   DULoxetine (CYMBALTA) 30 MG capsule Take 30 mg by mouth daily.     ezetimibe (ZETIA) 10 MG tablet Take 10 mg by mouth daily.     gabapentin (NEURONTIN) 300 MG capsule Take 300 mg by mouth 3 (three) times daily.     hydrOXYzine (ATARAX/VISTARIL) 25 MG tablet Take 1 tablet by mouth at bedtime.     levETIRAcetam (KEPPRA) 750 MG tablet Take 750 mg by mouth 2 (two) times daily.     omeprazole (PRILOSEC) 40 MG capsule Take 40 mg by mouth daily.     ondansetron (ZOFRAN ODT) 8 MG disintegrating tablet Take 1 tablet (8 mg total) every 8 (eight) hours as needed by mouth for nausea or vomiting. 15 tablet 0   promethazine (PHENERGAN) 25 MG tablet Take 1 tablet (25 mg total) by mouth every 6 (six) hours as needed for nausea or vomiting. 12 tablet 0   RESTASIS 0.05 % ophthalmic emulsion Place 1 drop into both eyes 2 (two) times daily.     SPIRIVA RESPIMAT 2.5 MCG/ACT AERS Inhale 1 puff into the lungs daily as needed  (shortness of breath). 4 g 1   topiramate (TOPAMAX) 25 MG tablet Take 1 tablet by mouth in the morning and at bedtime.     folic acid (FOLVITE) 1 MG tablet Take 1 tablet (1 mg total) by mouth daily. (Patient not taking: No sig reported) 30 tablet 2  nitroGLYCERIN (NITROSTAT) 0.4 MG SL tablet Place 1 tablet (0.4 mg total) under the tongue every 5 (five) minutes as needed for chest pain. 25 tablet 3   thiamine 100 MG tablet Take 1 tablet (100 mg total) by mouth daily. (Patient not taking: No sig reported) 30 tablet 3    Allergies as of 02/03/2021 - Review Complete 02/03/2021  Allergen Reaction Noted   Codeine Shortness Of Breath, Swelling, and Rash    Dilaudid [hydromorphone hcl] Shortness Of Breath and Swelling 12/26/2012   Hydrocodone Shortness Of Breath, Swelling, and Rash 12/26/2012   Morphine and related Shortness Of Breath, Swelling, and Rash 12/26/2012   Oxycodone Shortness Of Breath, Swelling, and Rash 12/26/2012   Penicillins Shortness Of Breath, Swelling, and Other (See Comments)    Acetaminophen  12/26/2012   Ativan [lorazepam] Other (See Comments) 12/20/2016   Strawberry extract Swelling 07/22/2011   Watermelon concentrate Swelling 07/22/2011   Benadryl [diphenhydramine hcl] Palpitations 09/23/2011   Latex Rash     Family History  Problem Relation Age of Onset   Cancer Mother    Heart failure Mother    Hypertension Mother    Heart attack Mother    Heart attack Father 49   Cancer Father 82   Heart failure Father    Diabetes Father    Crohn's disease Cousin     Social History   Socioeconomic History   Marital status: Divorced    Spouse name: Not on file   Number of children: Not on file   Years of education: Not on file   Highest education level: Not on file  Occupational History   Not on file  Tobacco Use   Smoking status: Some Days    Packs/day: 0.50    Years: 20.00    Pack years: 10.00    Types: Cigarettes    Start date: 09/26/1984   Smokeless tobacco:  Never  Substance and Sexual Activity   Alcohol use: No    Alcohol/week: 0.0 standard drinks   Drug use: Not Currently   Sexual activity: Yes    Birth control/protection: Surgical  Other Topics Concern   Not on file  Social History Narrative   Not on file   Social Determinants of Health   Financial Resource Strain: Not on file  Food Insecurity: Not on file  Transportation Needs: Not on file  Physical Activity: Not on file  Stress: Not on file  Social Connections: Not on file  Intimate Partner Violence: Not on file    Review of Systems: Gen: Denies fever, chills, like symptoms, presyncope, syncope. CV: Admits to chronic intermittent chest pain unchanged and no CP currently.  Resp: Denies shortness of breath or cough. GI: See HPI GU : Denies urinary burning, urinary frequency, urinary incontinence.  MS: Denies joint pain. Derm: Denies rash. Psych: Admits to anxiety. Heme: See HPI  Physical Exam: Vital signs in last 24 hours: Temp:  [98.2 F (36.8 C)] 98.2 F (36.8 C) (10/04 1139) Pulse Rate:  [116-128] 121 (10/04 1245) Resp:  [9-17] 15 (10/04 1245) BP: (132-157)/(57-137) 132/57 (10/04 1245) SpO2:  [95 %-98 %] 97 % (10/04 1245) Weight:  [73.3 kg] 73.3 kg (10/04 1140)   General:   Alert,  Well-developed, well-nourished, pleasant and cooperative in NAD Head:  Normocephalic and atraumatic. Eyes:  Sclera clear, no icterus.   Conjunctiva pink. Ears:  Normal auditory acuity. Lungs:  Clear throughout to auscultation.   No wheezes, crackles, or rhonchi. No acute distress. Heart: Tachycardic with regular rhythm, no  murmurs, clicks, rubs,  or gallops. Abdomen:  Soft, nontender and nondistended. No masses, hepatosplenomegaly or hernias noted. Normal bowel sounds, without guarding, and without rebound.   Rectal:  Deferred  Msk:  Symmetrical without gross deformities. Normal posture. Extremities:  Without edema. Neurologic:  Alert and  oriented x4;  grossly normal  neurologically. Skin:  Intact without significant lesions or rashes. Psych:  Normal mood and affect.  Intake/Output from previous day: No intake/output data recorded. Intake/Output this shift: No intake/output data recorded.  Lab Results: Recent Labs    02/03/21 1206 02/03/21 1304  WBC 8.8  --   HGB 13.4 14.3  HCT 40.9 42.0  PLT 729*  --    BMET Recent Labs    02/03/21 1206 02/03/21 1304  NA 133* 135  K 3.2* 3.2*  CL 96* 96*  CO2 27  --   GLUCOSE 118* 120*  BUN 8 7  CREATININE 0.86 0.70  CALCIUM 9.5  --    LFT Recent Labs    02/03/21 1206  PROT 7.6  ALBUMIN 3.7  AST 41  ALT 29  ALKPHOS 121  BILITOT 0.4    Studies/Results: CT ABDOMEN PELVIS W CONTRAST  Result Date: 02/03/2021 CLINICAL DATA:  Abdominal abscess/infection suspected. Rectal bleeding for 7 days. Denies pain. History of COPD, HTN, DM, hyster, GB, oophorectomy EXAM: CT ABDOMEN AND PELVIS WITH CONTRAST TECHNIQUE: Multidetector CT imaging of the abdomen and pelvis was performed using the standard protocol following bolus administration of intravenous contrast. CONTRAST:  148m OMNIPAQUE IOHEXOL 300 MG/ML  SOLN COMPARISON:  CT abdomen pelvis 01/21/2021 FINDINGS: Lower chest: Partially visualized slightly improved trace pericardial effusion. Resolution of bilateral pleural effusions. No acute abnormality. Hepatobiliary: Focally hypodensity of the hepatic parenchyma along the falciform ligament likely represents focal fatty infiltration. No focal liver abnormality. Status post cholecystectomy. No biliary dilatation. Pancreas: No focal lesion. Normal pancreatic contour. No surrounding inflammatory changes. No main pancreatic ductal dilatation. Spleen: Normal in size without focal abnormality. Adrenals/Urinary Tract: No adrenal nodule bilaterally. Bilateral kidneys enhance symmetrically. No hydronephrosis. No hydroureter. The urinary bladder is unremarkable. Resolution of gas associated with the urinary bladder. On  delayed imaging, there is no urothelial wall thickening and there are no filling defects in the opacified portions of the bilateral collecting systems or ureters. Stomach/Bowel: Stomach is within normal limits. No evidence of small bowel wall thickening or dilatation. Slightly improved but persistent bowel wall thickening, submucosal hyperemia, pericolonic fat stranding of the descending colon and rectosigmoid colon. Appendix appears normal. Vascular/Lymphatic: No abdominal aorta or iliac aneurysm. Mild noncalcified atherosclerotic plaque of the aorta and its branches. No abdominal, pelvic, or inguinal lymphadenopathy. Reproductive: Status post hysterectomy. No adnexal masses. Other: No intraperitoneal free fluid. No intraperitoneal free gas. No organized fluid collection. Musculoskeletal: No abdominal wall hernia or abnormality. No suspicious lytic or blastic osseous lesions. No acute displaced fracture. Multilevel degenerative changes of the spine. IMPRESSION: 1. Slightly improved but persistent distal colitis. 2. Partially visualized slightly improved trace pericardial effusion. Resolution of bilateral pleural effusions. 3.  Aortic Atherosclerosis (ICD10-I70.0). Electronically Signed   By: MIven FinnM.D.   On: 02/03/2021 15:12    Impression: 50year old female with history of COPD, HTN, type 2 diabetes, GERD, IBS, and questionable diagnosis of [facility] in [facility] but numerous studies including colonoscopies and imaging since then without evidence of active Crohn's, and patient denies ever being on treatment, who presented to the emergency room with chief complaint of rectal bleeding x1 week.  Notably,  she was recently admitted in September with colitis.  CT at that time with colitis extending from splenic flexure to rectum.  She was treated empirically with antibiotics with resolution of diarrhea and discharged on a 5-day course of Cipro and Flagyl.  CT today with slightly improved  but persistent distal colitis.  Encouragingly, hemoglobin within normal limits at 13.4.  GI consulted for further evaluation of colitis.  Colitis: Slight improvement, but persistent distal colitis of unclear etiology.  Possible infectious etiology as she previously improved with empiric antibiotic treatment, but did not complete her antibiotic course due to nausea/vomiting.  Considering prior ?diagnosis of Crohn's disease in 1998, cannot rule out IBD. She is currently having at least 3-4 watery bowel movements daily, nocturnal stools, and associated right lower quadrant abdominal pain, and nausea/vomiting.  Reports of rectal bleeding present as low-volume painless toilet tissue hematochezia which may be from a benign anorectal source.  Encouragingly, her hemoglobin has remained normal.  We will plan to check stool studies to rule out infectious diarrhea.  May continue empiric antibiotics for now.  If stool studies are unrevealing and/or patient doesn't have clinical improvement, will need to pursue colonoscopy inpatient.  Otherwise, she will need outpatient colonoscopy once her acute illness. Notably, last coloscopy on file in 2010 with anal canal internal hemorrhoids and papilla, grossly normal colon, normal TI.   GERD: Chronic. Needs PPI daily. Reports Protonix previously worked better than omeprazole. Will start PPI daily. Recommend discharging on Protonix.   Chronic nausea/vomiting: Likely multifactorial in the setting of GERD, constipation previously, possible gastroparesis in the setting of diabetes, and currently exacerbated by acute illness.   Plan: C. difficile and GI pathogen panel. Consider colonoscopy if stool studies are unrevealing and patient does not improve clinically. Otherwise, she will need colonoscopy outpatient once over acute illness.  May continue empiric antibiotics. Anusol rectal cream BID. Soft diet as tolerated.  Can decrease to clears if needed. Protonix 40 mg  daily. Zofran 4 mg every 6 hours as needed. Analgesics per hospitalist PRN.  Avoid NSAIDs. Monitor H/H and for overt GI bleeding   LOS: 0 days    02/03/2021, 3:47 PM   Aliene Altes, [facility]

## 2021-02-03 NOTE — H&P (Addendum)
History and Physical    [name] YOV:[id number] DOB: 05/27/1970 DOA: 02/03/2021  PCP: Teodoro Kil, PA-C   Patient coming from: Home  I have personally briefly reviewed patient's old medical records in [place]  Chief Complaint:Bloody stool  HPI: [name] is a 50 y.o. female with medical history significant for COPD, ??  Questionable Crohn's disease, diabetes mellitus, hypertension, seizures. Patient was brought to the ED reports of blood in stools, and vomiting with loose stools over the past week.  Also reports scab wounds to her head, she does not know how this came about.  She reports diarrhea also after eating today.  She reports blood on tissue after wiping, but there was no blood in her stools. She has never been told she has hemorrhoids.  Patient was recently admitted to the hospital 9/16-9/24 managed for sepsis secondary to colitis, acute metabolic encephalopathy secondary to postictal state from benzodiazepine withdrawal seizure and acute kidney injury.  She was also incidentally COVID-positive and asymptomatic from a respiratory standpoint.  Patient reports after she was discharged from the hospital she felt better, she took her antibiotics for one day, and stopped because she felt the metronidazole was making her feel sick-nauseous and vomiting.  Symptoms started and worsened subsequently after stopping her antibiotics.  She also reports left sided chest pain, non radiating, not related to activity. Reports heart attacks in Father, Mother and older brother.  ED Course: Temperature 98.2.  Tachycardic 170 128.  Blood pressure systolic 741O to 878.  WBC 8.8.  CT abdomen shows slightly improved but persistent distal colitis. IV Cipro and metronidazole started. 2 L bolus given. Hospitalist to admit.  Review of Systems: As per HPI all other systems reviewed and negative.  Past Medical History:  Diagnosis Date   Anxiety    Asthma    Chronic low back pain     Collagen vascular disease (HCC)    COPD (chronic obstructive pulmonary disease) (HCC)    Crohn's disease (HCC)    GERD (gastroesophageal reflux disease)    EGD 01/2007 by Dr.Rourke small hiatal hernia s/p 56 french maloney    History of head injury    Hypertension    IBS (irritable bowel syndrome)    PSVT (paroxysmal supraventricular tachycardia) (HCC)    PTSD (post-traumatic stress disorder)    Recurrent chest pain    Seizure disorder ([place])    Type 2 diabetes mellitus ([place])     Past Surgical History:  Procedure Laterality Date   ABDOMINAL HYSTERECTOMY     with right salpingo oophorectomy 2005   APPENDECTOMY  2006   CESAREAN SECTION     1996   CHOLECYSTECTOMY     COLONOSCOPY  2010   Dr. Gala Romney; negative except for hemorrhoids   ESOPHAGEAL DILATION N/A 10/25/2014   Procedure: ESOPHAGEAL DILATION;  Surgeon: Rogene Houston, MD;  Location: [facility];  Service: Endoscopy;  Laterality: N/A;   ESOPHAGOGASTRODUODENOSCOPY N/A 10/25/2014   Procedure: ESOPHAGOGASTRODUODENOSCOPY (EGD);  Surgeon: Rogene Houston, MD;  Location: [facility];  Service: Endoscopy;  Laterality: N/A;  1250   OOPHORECTOMY     left for torsion and ovarian fibroma; uterine myoma resected 1995     reports that she has been smoking cigarettes. She started smoking about 36 years ago. She has a 10.00 pack-year smoking history. She has never used smokeless tobacco. She reports that she does not currently use drugs. She reports that she does not drink alcohol.  Allergies  Allergen Reactions  Codeine Shortness Of Breath, Swelling and Rash    Throat swelling   Dilaudid [Hydromorphone Hcl] Shortness Of Breath and Swelling   Hydrocodone Shortness Of Breath, Swelling and Rash   Morphine And Related Shortness Of Breath, Swelling and Rash   Oxycodone Shortness Of Breath, Swelling and Rash    Patient states ALL pain medications make her deathly sick.   Penicillins Shortness Of Breath, Swelling and Other (See Comments)     Has patient had a PCN reaction causing immediate rash, facial/tongue/throat swelling, SOB or lightheadedness with hypotension: Yes Has patient had a PCN reaction causing severe rash involving mucus membranes or skin necrosis: Yes Has patient had a PCN reaction that required hospitalization Yes Has patient had a PCN reaction occurring within the last 10 years: No If all of the above answers are "NO", then may proceed with Cephalosporin use.   Throat swells    Acetaminophen     Per MD patient states she can't take Tylenol because of liver enzymes   Ativan [Lorazepam] Other (See Comments)    Migraines.   Strawberry Extract Swelling   Watermelon Concentrate Swelling   Benadryl [Diphenhydramine Hcl] Palpitations   Latex Rash    Family History  Problem Relation Age of Onset   Cancer Mother    Heart failure Mother    Hypertension Mother    Heart attack Mother    Heart attack Father 56   Cancer Father 68   Heart failure Father    Diabetes Father    Crohn's disease Cousin    Prior to Admission medications   Medication Sig Start Date End Date Taking? Authorizing Provider  acetaminophen (TYLENOL) 325 MG tablet Take 2 tablets (650 mg total) by mouth every 6 (six) hours as needed for mild pain or fever (or Fever >/= 101). 01/24/21  Yes Asucena Galer, Courage, MD  albuterol (VENTOLIN HFA) 108 (90 Base) MCG/ACT inhaler Inhale 2 puffs into the lungs every 4 (four) hours as needed for wheezing or shortness of breath. 01/24/21  Yes Yeudiel Mateo, Courage, MD  ALPRAZolam [name]) 1 MG tablet Take 1 mg by mouth 3 (three) times daily as needed for anxiety. 07/11/19  Yes [provider]  amitriptyline (ELAVIL) 75 MG tablet Take 75 mg by mouth at bedtime. 12/16/20  Yes [provider]  cetirizine (ZYRTEC) 10 MG tablet Take 10 mg by mouth daily. 03/30/19  Yes [provider]  cyclobenzaprine (FLEXERIL) 10 MG tablet Take 10 mg by mouth 3 (three) times daily as needed for muscle spasms.  07/11/19  Yes [provider]  diphenoxylate-atropine (LOMOTIL) 2.5-0.025 MG tablet Take 1 tablet 4 (four) times daily as needed by mouth for diarrhea or loose stools. 03/17/17  Yes Idol, Almyra Free, PA-C  DULoxetine (CYMBALTA) 30 MG capsule Take 30 mg by mouth daily. 12/15/20  Yes [provider]  ezetimibe (ZETIA) 10 MG tablet Take 10 mg by mouth daily. 12/15/20  Yes [provider]  gabapentin (NEURONTIN) 300 MG capsule Take 300 mg by mouth 3 (three) times daily. 11/20/20  Yes [provider]  hydrOXYzine (ATARAX/VISTARIL) 25 MG tablet Take 1 tablet by mouth at bedtime. 07/11/19  Yes [provider]  levETIRAcetam (KEPPRA) 750 MG tablet Take 750 mg by mouth 2 (two) times daily. 09/30/20  Yes [provider]  omeprazole (PRILOSEC) 40 MG capsule Take 40 mg by mouth daily. 07/21/19  Yes [provider]  ondansetron (ZOFRAN ODT) 8 MG disintegrating tablet Take 1 tablet (8 mg total) every 8 (eight) hours as  needed by mouth for nausea or vomiting. 03/17/17  Yes Idol, Almyra Free, PA-C  promethazine (PHENERGAN) 25 MG tablet Take 1 tablet (25 mg total) by mouth every 6 (six) hours as needed for nausea or vomiting. 07/30/19  Yes Noemi Chapel, MD  RESTASIS 0.05 % ophthalmic emulsion Place 1 drop into both eyes 2 (two) times daily. 12/12/20  Yes [provider]  SPIRIVA RESPIMAT 2.5 MCG/ACT AERS Inhale 1 puff into the lungs daily as needed (shortness of breath). 01/24/21  Yes Fiore Detjen, Courage, MD  topiramate (TOPAMAX) 25 MG tablet Take 1 tablet by mouth in the morning and at bedtime. 10/29/20  Yes [provider]  folic acid (FOLVITE) 1 MG tablet Take 1 tablet (1 mg total) by mouth daily. Patient not taking: No sig reported 01/25/21   Roxan Hockey, MD  nitroGLYCERIN (NITROSTAT) 0.4 MG SL tablet Place 1 tablet (0.4 mg total) under the tongue every 5 (five) minutes as needed for chest pain. 04/15/15   Herminio Commons, MD  thiamine 100 MG  tablet Take 1 tablet (100 mg total) by mouth daily. Patient not taking: No sig reported 01/25/21   Roxan Hockey, MD    Physical Exam: Vitals:   02/03/21 1215 02/03/21 1230 02/03/21 1245 02/03/21 1600  BP: 135/63 133/81 (!) 132/57 (!) 122/58  Pulse: (!) 122 (!) 116 (!) 121 (!) 113  Resp: (!) 9 12 15 10   Temp:      TempSrc:      SpO2: 98% 98% 97% 100%  Weight:      Height:        Constitutional: Acutely ill-appearing, Vitals:   02/03/21 1215 02/03/21 1230 02/03/21 1245 02/03/21 1600  BP: 135/63 133/81 (!) 132/57 (!) 122/58  Pulse: (!) 122 (!) 116 (!) 121 (!) 113  Resp: (!) 9 12 15 10   Temp:      TempSrc:      SpO2: 98% 98% 97% 100%  Weight:      Height:       Eyes: PERRL, lids and conjunctivae normal ENMT: Mucous membranes are dry.  Neck: normal, supple, no masses, no thyromegaly Respiratory: clear to auscultation bilaterally, no wheezing, no crackles. Normal respiratory effort. No accessory muscle use.  Cardiovascular: Tachycardic, regular rate and rhythm, no murmurs / rubs / gallops. No extremity edema. 2+ pedal pulses.  Abdomen: no tenderness, no masses palpated. No hepatosplenomegaly. Bowel sounds positive.  Musculoskeletal: no clubbing / cyanosis. No joint deformity upper and lower extremities. Good ROM, no contractures. Normal muscle tone.  Skin: 2 scab wounds to occipital area- measuring about ~3 cm across, wounds appear to be at least 9 weeks old, reports numbness to he head in this region, otherwise sensation in tact- face , trunk and extremities.  Neurologic: No apparent cranial nerve abnormality, moving all extremities spontaneously. Psychiatric: Normal judgment and insight. Alert and oriented x 3. Normal mood.   Labs on Admission: I have personally reviewed following labs and imaging studies  CBC: Recent Labs  Lab 02/03/21 1206 02/03/21 1304  WBC 8.8  --   HGB 13.4 14.3  HCT 40.9 42.0  MCV 92.3  --   PLT 729*  --    Basic Metabolic Panel: Recent Labs   Lab 02/03/21 1206 02/03/21 1304  NA 133* 135  K 3.2* 3.2*  CL 96* 96*  CO2 27  --   GLUCOSE 118* 120*  BUN 8 7  CREATININE 0.86 0.70  CALCIUM 9.5  --    Liver Function Tests: Recent Labs  Lab 02/03/21 1206  AST 41  ALT 29  ALKPHOS 121  BILITOT 0.4  PROT 7.6  ALBUMIN 3.7   Radiological Exams on Admission: CT ABDOMEN PELVIS W CONTRAST  Result Date: 02/03/2021 CLINICAL DATA:  Abdominal abscess/infection suspected. Rectal bleeding for 7 days. Denies pain. History of COPD, HTN, DM, hyster, GB, oophorectomy EXAM: CT ABDOMEN AND PELVIS WITH CONTRAST TECHNIQUE: Multidetector CT imaging of the abdomen and pelvis was performed using the standard protocol following bolus administration of intravenous contrast. CONTRAST:  150m OMNIPAQUE IOHEXOL 300 MG/ML  SOLN COMPARISON:  CT abdomen pelvis 01/21/2021 FINDINGS: Lower chest: Partially visualized slightly improved trace pericardial effusion. Resolution of bilateral pleural effusions. No acute abnormality. Hepatobiliary: Focally hypodensity of the hepatic parenchyma along the falciform ligament likely represents focal fatty infiltration. No focal liver abnormality. Status post cholecystectomy. No biliary dilatation. Pancreas: No focal lesion. Normal pancreatic contour. No surrounding inflammatory changes. No main pancreatic ductal dilatation. Spleen: Normal in size without focal abnormality. Adrenals/Urinary Tract: No adrenal nodule bilaterally. Bilateral kidneys enhance symmetrically. No hydronephrosis. No hydroureter. The urinary bladder is unremarkable. Resolution of gas associated with the urinary bladder. On delayed imaging, there is no urothelial wall thickening and there are no filling defects in the opacified portions of the bilateral collecting systems or ureters. Stomach/Bowel: Stomach is within normal limits. No evidence of small bowel wall thickening or dilatation. Slightly improved but persistent bowel wall thickening, submucosal  hyperemia, pericolonic fat stranding of the descending colon and rectosigmoid colon. Appendix appears normal. Vascular/Lymphatic: No abdominal aorta or iliac aneurysm. Mild noncalcified atherosclerotic plaque of the aorta and its branches. No abdominal, pelvic, or inguinal lymphadenopathy. Reproductive: Status post hysterectomy. No adnexal masses. Other: No intraperitoneal free fluid. No intraperitoneal free gas. No organized fluid collection. Musculoskeletal: No abdominal wall hernia or abnormality. No suspicious lytic or blastic osseous lesions. No acute displaced fracture. Multilevel degenerative changes of the spine. IMPRESSION: 1. Slightly improved but persistent distal colitis. 2. Partially visualized slightly improved trace pericardial effusion. Resolution of bilateral pleural effusions. 3.  Aortic Atherosclerosis (ICD10-I70.0). Electronically Signed   By: MIven FinnM.D.   On: 02/03/2021 15:12    EKG: Independently reviewed.  Sinus tachycardia rate 135, QTc 555.  No significant change from prior.  Assessment/Plan Principal Problem:   Colitis Active Problems:   TOBACCO ABUSE   COPD (chronic obstructive pulmonary disease) (HCC)   Diabetes mellitus without complication (HCC)   [id number] infection   Diarrhea   Colitis-with vomiting, abdominal pain and diarrhea. Tachycardic to 135, WBC 8.8.  Rules out for sepsis.  Recent hospitalization for same, with sepsis and AKI at that time.  Stool studies could not be done as patient had no further diarrheal episodes in the hospital.  -  CT chest today shows improved but persistent colitis.  Patient was unable to tolerate her oral medications after discharge- metronidazole.  But she did well on IV metronidazole during admission -Continue IV ciprofloxacin and metronidazole (penicillin allergy noted) -2 L bolus given, continue N/s + 40 KCL 100cc/hr x 20hrs - Bowel rest with clear liquid diet - GI consulted- antibiotics for now, stool studies,  consider colonoscopy if stool studies unrevealing and patient does not improve clinically.  As outpatient colonoscopy  Blood in stools-present on wiping.  ??Hemorrhoidal.  Hemoglobin stable 13.4. - Evaluated by GI  Electrolyte abnormalities hypomagnesemia 1.6, hypokalemia 3.2. -Replete lytes  Chest pain- Atypical. EKG without significant abnormalities. Family history of heart disease in 1st degree raltives, both patrents and brother. Smokes cigarettes. -  Trend troponin - EKG a.m  COPD, tobacco abuse- Ongoing tobacco use. - Duonebs PRN  Diabetes mellitus-random glucose 118.  Not on medication. - HgbA1c  Recent COVID-19 infection -asymptomatic.  Outside window for precautions.  Positive test 9/16.  Not retested today.  Seizure history-diagnosed with benzodiazepine withdrawal seizures on recent hospitalization.  Patient likely hit her head during the episode sustaining wounds to her occipital area which have scabbed over now. -Resume Xanax - Resume Keppra as IV  Prolonged QTC- 555. Not new, present on her EKGs from 07/2019. - Replete Lytes - She is on quinolones, but as much as possible avoiding further QT prolonging medications  DVT prophylaxis: SCDS for now Code Status: Full code Family Communication: Daughter at bedside Disposition Plan: >/~ 2 days  Consults called: Gi Admission status: inpt, tele I certify that at the point of admission it is my clinical judgment that the patient will require inpatient hospital care spanning beyond 2 midnights from the point of admission due to high intensity of service, high risk for further deterioration and high frequency of surveillance required.    Bethena Roys MD [facility]  02/03/2021, 7:19 PM

## 2021-02-03 NOTE — ED Provider Notes (Signed)
[facility] EMERGENCY DEPARTMENT Provider Note   CSN: [id number] Arrival date & time: 02/03/21  1031     History Chief Complaint  Patient presents with   Rectal Bleeding    [name] is a 50 y.o. female.  Patient presents with rectal bleeding for about a week.  She is in the hospital with colitis and was sent home on Cipro and Flagyl but stopped that after 1 day.  Shortly after that she started having rectal bleeding  The history is provided by the patient and medical records. No language interpreter was used.  Rectal Bleeding Quality:  Bright red Amount:  Moderate Timing:  Constant Chronicity:  Recurrent Context: not anal fissures   Similar prior episodes: no   Relieved by:  Nothing Worsened by:  Nothing Associated symptoms: abdominal pain       Past Medical History:  Diagnosis Date   Anxiety    Asthma    Chronic low back pain    Collagen vascular disease (HCC)    COPD (chronic obstructive pulmonary disease) (HCC)    Crohn's disease (HCC)    GERD (gastroesophageal reflux disease)    EGD 01/2007 by Dr.Rourke small hiatal hernia s/p 56 french maloney    History of head injury    Hypertension    IBS (irritable bowel syndrome)    PSVT (paroxysmal supraventricular tachycardia) (HCC)    PTSD (post-traumatic stress disorder)    Recurrent chest pain    Seizure disorder ([place])    Type 2 diabetes mellitus ([place])     Patient Active Problem List   Diagnosis Date Noted   Diarrhea    Acute metabolic encephalopathy 01/74/9449   Moderate Pericardial effusion 01/19/2021   Right foot pain    AKI (acute kidney injury) ([place]) 01/16/2021   Rhabdomyolysis 01/16/2021   Lactic acidosis 01/16/2021   COVID-19 virus infection 01/16/2021   Severe sepsis ([place]) 01/16/2021   Chest pain at rest 05/09/2016   COPD (chronic obstructive pulmonary disease) ([place])    Crohn's disease ([place])    Diabetes mellitus without complication (HCC)    Anxiety    Tobacco abuse    PTSD  (post-traumatic stress disorder)    Palpitation    Unspecified constipation 09/20/2012   Dyspnea 01/26/2012   Paresthesia 11/25/2011   Abdominal pain, chronic, epigastric 11/01/2011   Abdominal pain, acute, periumbilical 67/59/1638   GERD (gastroesophageal reflux disease)    Hyperglycemia    Chronic low back pain    IBS (irritable bowel syndrome)    Closed head injury    TOBACCO ABUSE 07/13/2010   Palpitations 07/13/2010   Pleuritic chest pain 07/13/2010   Anxiety state, unspecified 07/03/2008   BACK PAIN, LUMBAR, CHRONIC 07/03/2008    Past Surgical History:  Procedure Laterality Date   ABDOMINAL HYSTERECTOMY     with right salpingo oophorectomy 2005   APPENDECTOMY  2006   CESAREAN SECTION     1996   CHOLECYSTECTOMY     COLONOSCOPY  2010   Dr. Gala Romney; negative except for hemorrhoids   ESOPHAGEAL DILATION N/A 10/25/2014   Procedure: ESOPHAGEAL DILATION;  Surgeon: Rogene Houston, MD;  Location: [facility] ENDO SUITE;  Service: Endoscopy;  Laterality: N/A;   ESOPHAGOGASTRODUODENOSCOPY N/A 10/25/2014   Procedure: ESOPHAGOGASTRODUODENOSCOPY (EGD);  Surgeon: Rogene Houston, MD;  Location: [facility] ENDO SUITE;  Service: Endoscopy;  Laterality: N/A;  1250   OOPHORECTOMY     left for torsion and ovarian fibroma; uterine myoma resected 1995     OB History  Gravida  3   Para  1   Term  1   Preterm      AB  2   Living         SAB  2   IAB      Ectopic      Multiple      Live Births              Family History  Problem Relation Age of Onset   Cancer Mother    Heart failure Mother    Hypertension Mother    Heart attack Mother    Heart attack Father 25   Cancer Father 24   Heart failure Father    Diabetes Father    Crohn's disease Cousin     Social History   Tobacco Use   Smoking status: Some Days    Packs/day: 0.50    Years: 20.00    Pack years: 10.00    Types: Cigarettes    Start date: 09/26/1984   Smokeless tobacco: Never  Substance Use Topics    Alcohol use: No    Alcohol/week: 0.0 standard drinks   Drug use: Not Currently    Home Medications Prior to Admission medications   Medication Sig Start Date End Date Taking? Authorizing Provider  acetaminophen (TYLENOL) 325 MG tablet Take 2 tablets (650 mg total) by mouth every 6 (six) hours as needed for mild pain or fever (or Fever >/= 101). 01/24/21  Yes Emokpae, Courage, MD  albuterol (VENTOLIN HFA) 108 (90 Base) MCG/ACT inhaler Inhale 2 puffs into the lungs every 4 (four) hours as needed for wheezing or shortness of breath. 01/24/21  Yes Emokpae, Courage, MD  ALPRAZolam [name]) 1 MG tablet Take 1 mg by mouth 3 (three) times daily as needed for anxiety. 07/11/19  Yes [provider]  amitriptyline (ELAVIL) 75 MG tablet Take 75 mg by mouth at bedtime. 12/16/20  Yes [provider]  cetirizine (ZYRTEC) 10 MG tablet Take 10 mg by mouth daily. 03/30/19  Yes [provider]  cyclobenzaprine (FLEXERIL) 10 MG tablet Take 10 mg by mouth 3 (three) times daily as needed for muscle spasms. 07/11/19  Yes [provider]  diphenoxylate-atropine (LOMOTIL) 2.5-0.025 MG tablet Take 1 tablet 4 (four) times daily as needed by mouth for diarrhea or loose stools. 03/17/17  Yes Idol, Almyra Free, PA-C  DULoxetine (CYMBALTA) 30 MG capsule Take 30 mg by mouth daily. 12/15/20  Yes [provider]  ezetimibe (ZETIA) 10 MG tablet Take 10 mg by mouth daily. 12/15/20  Yes [provider]  gabapentin (NEURONTIN) 300 MG capsule Take 300 mg by mouth 3 (three) times daily. 11/20/20  Yes [provider]  hydrOXYzine (ATARAX/VISTARIL) 25 MG tablet Take 1 tablet by mouth at bedtime. 07/11/19  Yes [provider]  levETIRAcetam (KEPPRA) 750 MG tablet Take 750 mg by mouth 2 (two) times daily. 09/30/20  Yes [provider]  omeprazole (PRILOSEC) 40 MG capsule Take 40 mg by mouth daily. 07/21/19  Yes [provider]  ondansetron (ZOFRAN ODT) 8 MG  disintegrating tablet Take 1 tablet (8 mg total) every 8 (eight) hours as needed by mouth for nausea or vomiting. 03/17/17  Yes Idol, Almyra Free, PA-C  promethazine (PHENERGAN) 25 MG tablet Take 1 tablet (25 mg total) by mouth every 6 (six) hours as needed for nausea or vomiting. 07/30/19  Yes Noemi Chapel, MD  RESTASIS 0.05 % ophthalmic emulsion Place 1 drop into both eyes 2 (two) times daily. 12/12/20  Yes [provider]  SPIRIVA RESPIMAT 2.5 MCG/ACT AERS Inhale 1 puff into the lungs daily as needed (shortness of breath). 01/24/21  Yes Emokpae, Courage, MD  topiramate (TOPAMAX) 25 MG tablet Take 1 tablet by mouth in the morning and at bedtime. 10/29/20  Yes [provider]  folic acid (FOLVITE) 1 MG tablet Take 1 tablet (1 mg total) by mouth daily. Patient not taking: No sig reported 01/25/21   Roxan Hockey, MD  nitroGLYCERIN (NITROSTAT) 0.4 MG SL tablet Place 1 tablet (0.4 mg total) under the tongue every 5 (five) minutes as needed for chest pain. 04/15/15   Herminio Commons, MD  thiamine 100 MG tablet Take 1 tablet (100 mg total) by mouth daily. Patient not taking: No sig reported 01/25/21   Roxan Hockey, MD    Allergies    Codeine, Dilaudid [hydromorphone hcl], Hydrocodone, Morphine and related, Oxycodone, Penicillins, Acetaminophen, Ativan [lorazepam], Strawberry extract, Watermelon concentrate, Benadryl [diphenhydramine hcl], and Latex  Review of Systems   Review of Systems  Constitutional:  Negative for appetite change and fatigue.  HENT:  Negative for congestion, ear discharge and sinus pressure.   Eyes:  Negative for discharge.  Respiratory:  Negative for cough.   Cardiovascular:  Negative for chest pain.  Gastrointestinal:  Positive for abdominal pain and hematochezia. Negative for diarrhea.       Rectal bleeding  Genitourinary:  Negative for frequency and hematuria.  Musculoskeletal:  Negative for back pain.  Skin:  Negative for rash.  Neurological:   Negative for seizures and headaches.  Psychiatric/Behavioral:  Negative for hallucinations.    Physical Exam Updated Vital Signs BP (!) 132/57   Pulse (!) 121   Temp 98.2 F (36.8 C) (Oral)   Resp 15   Ht 5' 6"  (1.676 m)   Wt 73.3 kg   SpO2 97%   BMI 26.08 kg/m   Physical Exam Vitals and nursing note reviewed.  Constitutional:      Appearance: She is well-developed.  HENT:     Head: Normocephalic.     Nose: Nose normal.  Eyes:     General: No scleral icterus.    Conjunctiva/sclera: Conjunctivae normal.  Neck:     Thyroid: No thyromegaly.  Cardiovascular:     Rate and Rhythm: Normal rate and regular rhythm.     Heart sounds: No murmur heard.   No friction rub. No gallop.  Pulmonary:     Breath sounds: No stridor. No wheezing or rales.  Chest:     Chest wall: No tenderness.  Abdominal:     General: There is no distension.     Tenderness: There is abdominal tenderness. There is no rebound.  Musculoskeletal:        General: Normal range of motion.     Cervical back: Neck supple.  Lymphadenopathy:     Cervical: No cervical adenopathy.  Skin:    Findings: No erythema or rash.  Neurological:     Mental Status: She is alert and oriented to person, place, and time.     Motor: No abnormal muscle tone.     Coordination: Coordination normal.  Psychiatric:        Behavior: Behavior normal.    ED Results / Procedures / Treatments   Labs (all labs ordered are listed, but only abnormal results are displayed) Labs Reviewed  COMPREHENSIVE METABOLIC PANEL - Abnormal; Notable for the following components:      Result Value   Sodium 133 (*)    Potassium 3.2 (*)  Chloride 96 (*)    Glucose, Bld 118 (*)    All other components within normal limits  CBC - Abnormal; Notable for the following components:   Platelets 729 (*)    All other components within normal limits  POC OCCULT BLOOD, ED - Abnormal; Notable for the following components:   Fecal Occult Bld POSITIVE (*)     All other components within normal limits  I-STAT CHEM 8, ED - Abnormal; Notable for the following components:   Potassium 3.2 (*)    Chloride 96 (*)    Glucose, Bld 120 (*)    Calcium, Ion 1.12 (*)    All other components within normal limits  POC URINE PREG, ED  TYPE AND SCREEN    EKG None  Radiology CT ABDOMEN PELVIS W CONTRAST  Result Date: 02/03/2021 CLINICAL DATA:  Abdominal abscess/infection suspected. Rectal bleeding for 7 days. Denies pain. History of COPD, HTN, DM, hyster, GB, oophorectomy EXAM: CT ABDOMEN AND PELVIS WITH CONTRAST TECHNIQUE: Multidetector CT imaging of the abdomen and pelvis was performed using the standard protocol following bolus administration of intravenous contrast. CONTRAST:  16m OMNIPAQUE IOHEXOL 300 MG/ML  SOLN COMPARISON:  CT abdomen pelvis 01/21/2021 FINDINGS: Lower chest: Partially visualized slightly improved trace pericardial effusion. Resolution of bilateral pleural effusions. No acute abnormality. Hepatobiliary: Focally hypodensity of the hepatic parenchyma along the falciform ligament likely represents focal fatty infiltration. No focal liver abnormality. Status post cholecystectomy. No biliary dilatation. Pancreas: No focal lesion. Normal pancreatic contour. No surrounding inflammatory changes. No main pancreatic ductal dilatation. Spleen: Normal in size without focal abnormality. Adrenals/Urinary Tract: No adrenal nodule bilaterally. Bilateral kidneys enhance symmetrically. No hydronephrosis. No hydroureter. The urinary bladder is unremarkable. Resolution of gas associated with the urinary bladder. On delayed imaging, there is no urothelial wall thickening and there are no filling defects in the opacified portions of the bilateral collecting systems or ureters. Stomach/Bowel: Stomach is within normal limits. No evidence of small bowel wall thickening or dilatation. Slightly improved but persistent bowel wall thickening, submucosal hyperemia,  pericolonic fat stranding of the descending colon and rectosigmoid colon. Appendix appears normal. Vascular/Lymphatic: No abdominal aorta or iliac aneurysm. Mild noncalcified atherosclerotic plaque of the aorta and its branches. No abdominal, pelvic, or inguinal lymphadenopathy. Reproductive: Status post hysterectomy. No adnexal masses. Other: No intraperitoneal free fluid. No intraperitoneal free gas. No organized fluid collection. Musculoskeletal: No abdominal wall hernia or abnormality. No suspicious lytic or blastic osseous lesions. No acute displaced fracture. Multilevel degenerative changes of the spine. IMPRESSION: 1. Slightly improved but persistent distal colitis. 2. Partially visualized slightly improved trace pericardial effusion. Resolution of bilateral pleural effusions. 3.  Aortic Atherosclerosis (ICD10-I70.0). Electronically Signed   By: MIven FinnM.D.   On: 02/03/2021 15:12    Procedures Procedures   Medications Ordered in ED Medications  ciprofloxacin (CIPRO) IVPB 400 mg (400 mg Intravenous New Bag/Given 02/03/21 1519)  sodium chloride 0.9 % bolus 1,000 mL (1,000 mLs Intravenous New Bag/Given 02/03/21 1338)  sodium chloride 0.9 % bolus 1,000 mL (1,000 mLs Intravenous New Bag/Given 02/03/21 1331)  metroNIDAZOLE (FLAGYL) IVPB 500 mg (500 mg Intravenous New Bag/Given 02/03/21 1341)  iohexol (OMNIPAQUE) 300 MG/ML solution 100 mL (100 mLs Intravenous Contrast Given 02/03/21 1432)    ED Course  I have reviewed the triage vital signs and the nursing notes.  Pertinent labs & imaging results that were available during my care of the patient were reviewed by me and considered in my medical decision making (see chart for  details).    MDM Rules/Calculators/A&P                           Patient with continued colitis and rectal bleeding she will be admitted to medicine with GI consult Final Clinical Impression(s) / ED Diagnoses Final diagnoses:  None    Rx / DC Orders ED Discharge  Orders     None        Milton Ferguson, MD 02/07/21 1143

## 2021-02-04 DIAGNOSIS — R197 Diarrhea, unspecified: Secondary | ICD-10-CM

## 2021-02-04 DIAGNOSIS — K529 Noninfective gastroenteritis and colitis, unspecified: Secondary | ICD-10-CM | POA: Diagnosis not present

## 2021-02-04 DIAGNOSIS — K921 Melena: Secondary | ICD-10-CM

## 2021-02-04 DIAGNOSIS — F17200 Nicotine dependence, unspecified, uncomplicated: Secondary | ICD-10-CM | POA: Diagnosis not present

## 2021-02-04 DIAGNOSIS — U071 COVID-19: Secondary | ICD-10-CM | POA: Diagnosis not present

## 2021-02-04 LAB — RAPID URINE DRUG SCREEN, HOSP PERFORMED
Amphetamines: NOT DETECTED
Barbiturates: NOT DETECTED
Benzodiazepines: POSITIVE — AB
Cocaine: NOT DETECTED
Opiates: NOT DETECTED
Tetrahydrocannabinol: POSITIVE — AB

## 2021-02-04 LAB — BASIC METABOLIC PANEL
Anion gap: 5 (ref 5–15)
BUN: <5 mg/dL — ABNORMAL LOW (ref 6–20)
CO2: 27 mmol/L (ref 22–32)
Calcium: 8.2 mg/dL — ABNORMAL LOW (ref 8.9–10.3)
Chloride: 104 mmol/L (ref 98–111)
Creatinine, Ser: 0.77 mg/dL (ref 0.44–1.00)
GFR, Estimated: >60 mL/min (ref >60)
Glucose, Bld: 111 mg/dL — ABNORMAL HIGH (ref 70–99)
Potassium: 3.7 mmol/L (ref 3.5–5.1)
Sodium: 136 mmol/L (ref 135–145)

## 2021-02-04 LAB — URINALYSIS, COMPLETE (UACMP) WITH MICROSCOPIC
Bilirubin Urine: NEGATIVE
Glucose, UA: NEGATIVE mg/dL
Ketones, ur: NEGATIVE mg/dL
Nitrite: NEGATIVE
Protein, ur: 30 mg/dL — AB
Specific Gravity, Urine: 1.005 (ref 1.005–1.030)
pH: 6 (ref 5.0–8.0)

## 2021-02-04 LAB — TROPONIN I (HIGH SENSITIVITY): Troponin I (High Sensitivity): 4 ng/L (ref <18)

## 2021-02-04 LAB — GLUCOSE, CAPILLARY
Glucose-Capillary: 90 mg/dL (ref 70–99)
Glucose-Capillary: 108 mg/dL — ABNORMAL HIGH (ref 70–99)
Glucose-Capillary: 85 mg/dL (ref 70–99)

## 2021-02-04 LAB — CBC
HCT: 34.2 % — ABNORMAL LOW (ref 36.0–46.0)
Hemoglobin: 10.7 g/dL — ABNORMAL LOW (ref 12.0–15.0)
MCH: 29.9 pg (ref 26.0–34.0)
MCHC: 31.3 g/dL (ref 30.0–36.0)
MCV: 95.5 fL (ref 80.0–100.0)
Platelets: 486 10*3/uL — ABNORMAL HIGH (ref 150–400)
RBC: 3.58 MIL/uL — ABNORMAL LOW (ref 3.87–5.11)
RDW: 13.4 % (ref 11.5–15.5)
WBC: 4.3 10*3/uL (ref 4.0–10.5)
nRBC: 0 % (ref 0.0–0.2)

## 2021-02-04 LAB — CBG MONITORING, ED: Glucose-Capillary: 99 mg/dL (ref 70–99)

## 2021-02-04 LAB — HEMOGLOBIN A1C
Hgb A1c MFr Bld: 5.7 % — ABNORMAL HIGH (ref 4.8–5.6)
Mean Plasma Glucose: 116.89 mg/dL

## 2021-02-04 LAB — C-REACTIVE PROTEIN: CRP: 0.6 mg/dL (ref <1.0)

## 2021-02-04 LAB — VITAMIN B12: Vitamin B-12: 627 pg/mL (ref 180–914)

## 2021-02-04 MED ORDER — SODIUM CHLORIDE 0.9 % IV SOLN
2.0000 g | INTRAVENOUS | Status: DC
  Administered 2021-02-04 – 2021-02-06 (×3): 2 g via INTRAVENOUS
  Filled 2021-02-04 (×3): qty 20

## 2021-02-04 NOTE — Progress Notes (Signed)
PROGRESS NOTE  [name] VVO:[id number] DOB: July 24, 1970 DOA: 02/03/2021 PCP: Teodoro Kil, PA-C   Brief History:   50 y.o. female, with history of COPD, questionable Crohn's, GERD, HTN, T2DM, xanax withdrawal seizure, tobacco abuse presenting with 1 week history of hematochezia and intermittent nausea and vomiting.  The patient was recent admitted to the hospital from 01/16/2021 to 01/24/2021 secondary to sepsis due to colitis.  The patient was seen by GI and it was felt that the patient could be treated with antibiotics.  She was discharged home with 5 additional days of Cipro and Flagyl.  Any procedures were felt to be able to be deferred into the outpatient setting.  The patient states that she was noncompliant with her antibiotics.  She only took 1 day of her antibiotics after discharge which caused her to have nausea and vomiting.  As result, she stopped taking the antibiotics.  During her last hospitalization, the patient was also treated for Xanax withdrawal seizures as well as a COVID-19 infection for which she was largely asymptomatic. She denies any fevers, chills, chest pain, shortness breath, headache, neck pain, coughing, hemoptysis.  She continues to smoke occasionally. In the emergency department, the patient was afebrile hemodynamically stable with oxygen saturation 100% room air.  WBC 8.8, hemoglobin 13.4, platelets 729K.  Sodium 136, potassium 3.7, BUN less than 5, creatinine 0.86.  CT of the abdomen and pelvis showed slightly improved but persistent bowel wall thickening and pericolonic stranding involving the descending and rectosigmoid colon.  Assessment/Plan: Hematochezia -Likely secondary to the patient's colitis -She was seen by GI during her last hospital admission who felt this was more likely to be infectious rather than IBD. -review of medical records showed pt reports dx of Crohn's diagnosed 33 at [facility] but numerous studies including colonoscopy  (05/24/2008) which showed normal terminal ileum and SBFT (07/26/2011) which showed normal SB have questioned if this was a true diagnosis -Restart antibiotics -Plan to give test dose of ceftriaxone as further questioning reveals that the patient really does not recall her reaction which happened when she was a small child -Consult GI -C. difficile negative -Stool pathogen panel pending -stool calprotectin  Hypokalemia/hypomagnesemia -Replete  Anxiety -Restart Xanax home dose -PDMP reviewed shows that the patient received Xanax 1 mg, #90 monthly  Pericardial effusion -01/22/2021 echo EF 60 to 65%, moderate pericardial effusion unchanged from 01/19/2021 -Patient is hemodynamically stable  COPD -Stable on room air  Tobacco abuse -Tobacco cessation discussed  THC abuse -cessation discussed     Status is: Inpatient  Remains inpatient appropriate because:Inpatient level of care appropriate due to severity of illness  Dispo: The patient is from: Home              Anticipated d/c is to: Home              Patient currently is not medically stable to d/c.   Difficult to place patient No        Family Communication:   significant other at bedside 10/5  Consultants:  GI  Code Status:  FULL  DVT Prophylaxis:  [place] Heparin / [place] Lovenox   Procedures: As Listed in Progress Note Above  Antibiotics: Cipro 10/4>> Flagyl 10/4>>      Subjective: Patient denies any fevers, chills, chest pain, shortness of breath.  She states her abdominal pain is controlled.  There is no vomiting or diarrhea.  She has some dysuria.  Objective: Vitals:  02/03/21 2038 02/04/21 0015 02/04/21 0100 02/04/21 0500  BP: (!) 125/52 137/67 (!) 125/59 98/84  Pulse: (!) 107 80 81 96  Resp: 14 16 15 12   Temp: 98 F (36.7 C)   97.7 F (36.5 C)  TempSrc: Oral   Oral  SpO2: 98% 99% 97% 99%  Weight:      Height:        Intake/Output Summary (Last 24 hours) at 02/04/2021 0706 Last data filed at  02/04/2021 0505 Gross per 24 hour  Intake 1200 ml  Output 600 ml  Net 600 ml   Weight change:  Exam:  General:  Pt is alert, follows commands appropriately, not in acute distress HEENT: No icterus, No thrush, No neck mass, Stephen/AT Cardiovascular: RRR, S1/S2, no rubs, no gallops Respiratory: Bibasilar crackles, left greater than right no wheezing Abdomen: Soft/+BS, mild diffuse tender, non distended, no guarding Extremities: No edema, No lymphangitis, No petechiae, No rashes, no synovitis   Data Reviewed: I have personally reviewed following labs and imaging studies Basic Metabolic Panel: Recent Labs  Lab 02/03/21 1206 02/03/21 1304 02/03/21 1635 02/04/21 0455  NA 133* 135  --  136  K 3.2* 3.2*  --  3.7  CL 96* 96*  --  104  CO2 27  --   --  27  GLUCOSE 118* 120*  --  111*  BUN 8 7  --  <5*  CREATININE 0.86 0.70  --  0.77  CALCIUM 9.5  --   --  8.2*  MG  --   --  1.6*  --    Liver Function Tests: Recent Labs  Lab 02/03/21 1206  AST 41  ALT 29  ALKPHOS 121  BILITOT 0.4  PROT 7.6  ALBUMIN 3.7   No results for input(s): LIPASE, AMYLASE in the last 168 hours. No results for input(s): AMMONIA in the last 168 hours. Coagulation Profile: No results for input(s): INR, PROTIME in the last 168 hours. CBC: Recent Labs  Lab 02/03/21 1206 02/03/21 1304 02/04/21 0455  WBC 8.8  --  4.3  HGB 13.4 14.3 10.7*  HCT 40.9 42.0 34.2*  MCV 92.3  --  95.5  PLT 729*  --  486*   Cardiac Enzymes: No results for input(s): CKTOTAL, CKMB, CKMBINDEX, TROPONINI in the last 168 hours. BNP: Invalid input(s): POCBNP CBG: Recent Labs  Lab 02/03/21 2209  GLUCAP 116*   HbA1C: No results for input(s): HGBA1C in the last 72 hours. Urine analysis:    Component Value Date/Time   COLORURINE YELLOW 01/16/2021 0256   APPEARANCEUR HAZY (A) 01/16/2021 0256   LABSPEC 1.008 01/16/2021 0256   PHURINE 6.0 01/16/2021 0256   GLUCOSEU NEGATIVE 01/16/2021 0256   GLUCOSEU NEG mg/dL 12/24/2009  2317   HGBUR LARGE (A) 01/16/2021 0256   BILIRUBINUR NEGATIVE 01/16/2021 0256   KETONESUR NEGATIVE 01/16/2021 0256   PROTEINUR 30 (A) 01/16/2021 0256   UROBILINOGEN 0.2 12/26/2013 1530   NITRITE NEGATIVE 01/16/2021 0256   LEUKOCYTESUR NEGATIVE 01/16/2021 0256   Sepsis Labs: @LABRCNTIP (procalcitonin:4,lacticidven:4) ) Recent Results (from the past 240 hour(s))  C Difficile Quick Screen w PCR reflex     Status: None   Collection Time: 02/03/21  9:49 PM   Specimen: STOOL  Result Value Ref Range Status   C Diff antigen NEGATIVE NEGATIVE Final   C Diff toxin NEGATIVE NEGATIVE Final   C Diff interpretation No C. difficile detected.  Final    Comment: Performed at [facility], [address] [place], [place] [address]  Scheduled Meds:  ALPRAZolam  1 mg Oral TID   hydrocortisone   Rectal BID   insulin aspart  0-5 Units Subcutaneous QHS   insulin aspart  0-9 Units Subcutaneous TID WC   pantoprazole  40 mg Oral Daily   Continuous Infusions:  0.9 % NaCl with KCl 40 mEq / L 75 mL/hr at 02/03/21 2243   ciprofloxacin Stopped (02/04/21 0114)   levETIRAcetam 750 mg (02/03/21 2241)   metronidazole Stopped (02/04/21 0224)    Procedures/Studies: CT HEAD WO CONTRAST (5MM)  Result Date: 01/16/2021 CLINICAL DATA:  Status post seizure. EXAM: CT HEAD WITHOUT CONTRAST TECHNIQUE: Contiguous axial images were obtained from the base of the skull through the vertex without intravenous contrast. COMPARISON:  June 03, 2020 FINDINGS: Brain: No evidence of acute infarction, hemorrhage, hydrocephalus, extra-axial collection or mass lesion/mass effect. Vascular: No hyperdense vessel or unexpected calcification. Skull: Normal. Negative for fracture or focal lesion. Sinuses/Orbits: A small right maxillary sinus air-fluid level is seen. Other: None. IMPRESSION: 1. No acute intracranial abnormality. Electronically Signed   By: Virgina Norfolk M.D.   On: 01/16/2021 01:08   CT ABDOMEN PELVIS W  CONTRAST  Result Date: 02/03/2021 CLINICAL DATA:  Abdominal abscess/infection suspected. Rectal bleeding for 7 days. Denies pain. History of COPD, HTN, DM, hyster, GB, oophorectomy EXAM: CT ABDOMEN AND PELVIS WITH CONTRAST TECHNIQUE: Multidetector CT imaging of the abdomen and pelvis was performed using the standard protocol following bolus administration of intravenous contrast. CONTRAST:  167m OMNIPAQUE IOHEXOL 300 MG/ML  SOLN COMPARISON:  CT abdomen pelvis 01/21/2021 FINDINGS: Lower chest: Partially visualized slightly improved trace pericardial effusion. Resolution of bilateral pleural effusions. No acute abnormality. Hepatobiliary: Focally hypodensity of the hepatic parenchyma along the falciform ligament likely represents focal fatty infiltration. No focal liver abnormality. Status post cholecystectomy. No biliary dilatation. Pancreas: No focal lesion. Normal pancreatic contour. No surrounding inflammatory changes. No main pancreatic ductal dilatation. Spleen: Normal in size without focal abnormality. Adrenals/Urinary Tract: No adrenal nodule bilaterally. Bilateral kidneys enhance symmetrically. No hydronephrosis. No hydroureter. The urinary bladder is unremarkable. Resolution of gas associated with the urinary bladder. On delayed imaging, there is no urothelial wall thickening and there are no filling defects in the opacified portions of the bilateral collecting systems or ureters. Stomach/Bowel: Stomach is within normal limits. No evidence of small bowel wall thickening or dilatation. Slightly improved but persistent bowel wall thickening, submucosal hyperemia, pericolonic fat stranding of the descending colon and rectosigmoid colon. Appendix appears normal. Vascular/Lymphatic: No abdominal aorta or iliac aneurysm. Mild noncalcified atherosclerotic plaque of the aorta and its branches. No abdominal, pelvic, or inguinal lymphadenopathy. Reproductive: Status post hysterectomy. No adnexal masses. Other: No  intraperitoneal free fluid. No intraperitoneal free gas. No organized fluid collection. Musculoskeletal: No abdominal wall hernia or abnormality. No suspicious lytic or blastic osseous lesions. No acute displaced fracture. Multilevel degenerative changes of the spine. IMPRESSION: 1. Slightly improved but persistent distal colitis. 2. Partially visualized slightly improved trace pericardial effusion. Resolution of bilateral pleural effusions. 3.  Aortic Atherosclerosis (ICD10-I70.0). Electronically Signed   By: MIven FinnM.D.   On: 02/03/2021 15:12   CT ABDOMEN PELVIS W CONTRAST  Result Date: 01/21/2021 CLINICAL DATA:  Abdominal pain, acute nonlocalized fever, leukocytosis. Abdominal abscess/infection suspected. COVID positive. History of COPD, collagen vascular disease and Crohn's disease. EXAM: CT ABDOMEN AND PELVIS WITH CONTRAST TECHNIQUE: Multidetector CT imaging of the abdomen and pelvis was performed using the standard protocol following bolus administration of intravenous contrast. CONTRAST:  820mOMNIPAQUE IOHEXOL 350 MG/ML  SOLN COMPARISON:  CT 09/14/2015. FINDINGS: Lower chest: New small to moderate dependent pleural effusions with associated bibasilar atelectasis at both lung bases. There is a small pericardial effusion as well. The heart size is normal. No acute vascular findings are evident. Hepatobiliary: The liver is normal in density without suspicious focal abnormality. No significant biliary dilatation status post cholecystectomy. Pancreas: Unremarkable. No pancreatic ductal dilatation or surrounding inflammatory changes. Spleen: Normal in size without focal abnormality. Adrenals/Urinary Tract: Both adrenal glands appear normal. Both kidneys appear normal. No evidence of urinary tract calculus, hydronephrosis or perinephric soft tissue stranding. The urinary bladder is moderately distended. There is a small amount of air within the bladder lumen. No bladder wall thickening or immediate  surrounding inflammatory changes identified. Stomach/Bowel: Enteric contrast was administered and has passed into the mid small bowel. The stomach appears normal for its degree of distention. There is no significant small bowel distension, wall thickening or surrounding inflammation. By report, the patient is status post appendectomy. The proximal colon is fluid-filled and mildly distended. There is diffuse wall thickening of the colon extending from the splenic flexure to the rectum. Wall thickening and enhancement are most prominent within the proximal rectum. Distally, the rectal walls are less well-defined, and distal rectal perforation is difficult to exclude. There is moderate perirectal soft tissue stranding. These inflammatory changes do not appear to directly abut the posterior wall of the bladder. No discrete fistula are seen. Vascular/Lymphatic: There are no enlarged abdominal or pelvic lymph nodes. No acute vascular findings. The portal, superior mesenteric and splenic veins are patent. Reproductive: Hysterectomy. No adnexal mass. There is no gas within the vagina. Other: Small amount of pelvic ascites. As above, possible perirectal fluid without drainable fluid collection or fistula. No other extraluminal fluid collections are identified. There is no free air. Musculoskeletal: No acute or significant osseous findings. No evidence of discitis or sacroiliitis. IMPRESSION: 1. Findings are consistent with distal colitis, extending from the splenic flexure of the colon into the rectum. This colitis is presumably secondary to the patient's Crohn's disease. 2. The walls of the distal rectum are ill-defined, and rectal perforation difficult to exclude. No drainable fluid collection identified. 3. Gas within the wall of the bladder without bladder wall thickening or immediate surrounding inflammatory changes. The gas could be iatrogenic, although if catheterization has not been recently performed, a colovesical  fistula would be a consideration. 4. No evidence of small-bowel inflammation or obstruction. 5. Bilateral pleural effusions with small pericardial effusion and bibasilar atelectasis. 6. These results will be called to the ordering clinician or representative by the Radiologist Assistant, and communication documented in the PACS or [employer]. Electronically Signed   By: Richardean Sale M.D.   On: 01/21/2021 16:45   US RENAL  Result Date: 01/16/2021 CLINICAL DATA:  Acute kidney injury EXAM: RENAL / URINARY TRACT ULTRASOUND COMPLETE COMPARISON:  None. FINDINGS: Right Kidney: Renal measurements: 10.7 x 5.0 x 5.5 cm = volume: 156 mL. Echogenicity within normal limits. No mass or hydronephrosis visualized. Left Kidney: Renal measurements: 10.3 x 6.0 x 5.3 cm = volume: 172 mL. Echogenicity within normal limits. No mass or hydronephrosis visualized. Bladder: Appears normal for degree of bladder distention. Bilateral ureteral jets are not noted, and the patient is not able to void at the time of the examination. Other: None. IMPRESSION: 1. Unremarkable sonographic appearance of the kidneys. No hydronephrosis. 2. Bilateral ureteral jets are not noted, and the patient is not able to void at the time of the  examination. Correlate for urinary retention. Electronically Signed   By: Eddie Candle M.D.   On: 01/16/2021 13:04   US Carotid Bilateral  Result Date: 01/16/2021 CLINICAL DATA:  Altered mental status, seizures EXAM: BILATERAL CAROTID DUPLEX ULTRASOUND TECHNIQUE: Pearline Cables scale imaging, color Doppler and duplex ultrasound were performed of bilateral carotid and vertebral arteries in the neck. COMPARISON:  None. FINDINGS: Criteria: Quantification of carotid stenosis is based on velocity parameters that correlate the residual internal carotid diameter with NASCET-based stenosis levels, using the diameter of the distal internal carotid lumen as the denominator for stenosis measurement. The following velocity  measurements were obtained: RIGHT ICA: 127/43 cm/sec CCA: 354/65 cm/sec SYSTOLIC ICA/CCA RATIO:  0.9 ECA:  118 cm/sec LEFT ICA: 145/41 cm/sec CCA: 681/27 cm/sec SYSTOLIC ICA/CCA RATIO:  1.1 ECA:  107 cm/sec RIGHT CAROTID ARTERY: No significant atherosclerotic plaque or evidence of stenosis in the internal carotid artery. RIGHT VERTEBRAL ARTERY:  Patent with normal antegrade flow. LEFT CAROTID ARTERY: Trace smooth heterogeneous atherosclerotic plaque in the proximal internal carotid artery. By peak systolic velocity criteria in the region of plaque, the estimated stenosis is less than 50%. LEFT VERTEBRAL ARTERY:  Patent with normal antegrade flow. IMPRESSION: 1. Mild (1-49%) stenosis proximal left internal carotid artery secondary to trace smooth heterogeneous atherosclerotic plaque. 2. No significant atherosclerotic plaque or evidence of stenosis in the right internal carotid artery. 3. Vertebral arteries are patent with normal antegrade flow. Signed, Criselda Peaches, MD, [facility] Electronically Signed   By: Jacqulynn Cadet M.D.   On: 01/16/2021 11:00   DG CHEST PORT 1 VIEW  Result Date: 01/17/2021 CLINICAL DATA:  Severe sepsis.  Patient is COVID-19 positive. EXAM: PORTABLE CHEST 1 VIEW COMPARISON:  January 16, 2021 FINDINGS: Heart, hila, mediastinum are normal. No pneumothorax. No nodules or masses. Minimal haziness in the left costophrenic angle, likely atelectasis. No suspicious infiltrates are identified. IMPRESSION: Minimal haziness in the left costophrenic angle is favored to represent atelectasis. No other abnormalities are identified. No suspicious infiltrates. Electronically Signed   By: Dorise Bullion III M.D.   On: 01/17/2021 08:13   DG Chest Port 1 View  Result Date: 01/16/2021 CLINICAL DATA:  Seizure. EXAM: PORTABLE CHEST 1 VIEW COMPARISON:  May 09, 2016 FINDINGS: The heart size and mediastinal contours are within  normal limits. Both lungs are clear. The visualized skeletal structures are unremarkable. IMPRESSION: No active disease. Electronically Signed   By: Virgina Norfolk M.D.   On: 01/16/2021 01:02   DG Foot 2 Views Right  Result Date: 01/19/2021 CLINICAL DATA:  Recent fall with bruising EXAM: RIGHT FOOT - 2 VIEW COMPARISON:  None. FINDINGS: There is no evidence of fracture or dislocation. There is no evidence of arthropathy or other focal bone abnormality. Soft tissues are unremarkable. IMPRESSION: Negative. Electronically Signed   By: Jorje Guild M.D.   On: 01/19/2021 11:14   EEG adult  Result Date: 01/16/2021 Lora Havens, MD     01/16/2021 10:16 PM Patient Name: Starlyn Droge MRN: [id number] Epilepsy Attending: Lora Havens Referring Physician/Provider: Dr Clearence Ped, [place] B Date: 01/16/2021 Duration: 22.32 mins Patient history: 50 yo F with AMS and metabolic changes, Likely due to xanax withdrawal seizures. EEG to evaluate for seizure Level of alertness: Awake AEDs during EEG study: LEV, GBP Technical aspects: This EEG study was done with scalp electrodes positioned according to the 10-20 International system of electrode placement. Electrical activity was acquired at a sampling rate of 500Hz  and  reviewed with a high frequency filter of 70Hz  and a low frequency filter of 1Hz . EEG data were recorded continuously and digitally stored. Description: The posterior dominant rhythm consists of 9 Hz activity of moderate voltage (25-35 uV) seen predominantly in posterior head regions, symmetric and reactive to eye opening and eye closing. EEG showed intermittent generalized 5 to 6 Hz theta slowing. Hyperventilation and photic stimulation were not performed.   ABNORMALITY - Intermittent slow, generalized IMPRESSION: This study is suggestive of mild diffuse encephalopathy, nonspecific etiology. No seizures or epileptiform discharges were seen throughout the recording. Lora Havens   ECHOCARDIOGRAM  COMPLETE  Result Date: 01/16/2021    ECHOCARDIOGRAM REPORT   Patient Name:   MARGERY SZOSTAK Date of Exam: 01/16/2021 Medical Rec #:  [id number]     Height:       66.0 in Accession #:    [id number]    Weight:       145.0 lb Date of Birth:  December 16, 1970     BSA:          1.744 m Patient Age:    63 years      BP:           81/20 mmHg Patient Gender: F             HR:           111 bpm. Exam Location:  [name] Procedure: 2D Echo, Cardiac Doppler and Color Doppler Indications:    Syncope  History:        Patient has prior history of Echocardiogram examinations, most                 recent 07/11/2010. COPD, Signs/Symptoms:Dyspnea; Risk                 Factors:Tobacco abuse and Diabetes. COVID +.  Sonographer:    Wenda Low Referring Phys: ASIA B Avery  1. Left ventricular ejection fraction, by estimation, is 70 to 75%. The left ventricle has hyperdynamic function. The left ventricle has no regional wall motion abnormalities. There is mild left ventricular hypertrophy. Left ventricular diastolic parameters are indeterminate.  2. Right ventricular systolic function is normal. The right ventricular size is normal. Tricuspid regurgitation signal is inadequate for assessing PA pressure.  3. No right atrial or right ventricular collapse to suggest tamponade physiology. Respiratory variation in mitral outflow not assessed. Presence of sinus tachycardia does raise concern however for potential hemodynamic significance. Follow-up clinically  and suggest repeat limited echocardiogram within tht next 48 hours. Moderate pericardial effusion. The pericardial effusion is circumferential.  4. The mitral valve is grossly normal. Mild mitral valve regurgitation.  5. The aortic valve is tricuspid. Aortic valve regurgitation is not visualized. No aortic stenosis is present. Aortic valve mean gradient measures 6.0 mmHg.  6. The inferior vena cava is normal in size with <50% respiratory variability, suggesting right  atrial pressure of 8 mmHg. Comparison(s): No prior Echocardiogram. FINDINGS  Left Ventricle: Left ventricular ejection fraction, by estimation, is 70 to 75%. The left ventricle has hyperdynamic function. The left ventricle has no regional wall motion abnormalities. The left ventricular internal cavity size was normal in size. There is mild left ventricular hypertrophy. Left ventricular diastolic parameters are indeterminate. Right Ventricle: The right ventricular size is normal. No increase in right ventricular wall thickness. Right ventricular systolic function is normal. Tricuspid regurgitation signal is inadequate for assessing PA pressure. Left Atrium: Left atrial size was normal in size. Right Atrium: Right atrial  size was normal in size. Pericardium: No right atrial or right ventricular collapse to suggest tamponade physiology. Respiratory variation in mitral outflow not assessed. Presence of sinus tachycardia does raise concern however for potential hemodynamic significance. Follow-up clinically and suggest repeat limited echocardiogram within tht next 48 hours. A moderately sized pericardial effusion is present. The pericardial effusion is circumferential. Mitral Valve: The mitral valve is grossly normal. Mild mitral valve regurgitation. MV peak gradient, 13.1 mmHg. The mean mitral valve gradient is 6.0 mmHg. Tricuspid Valve: The tricuspid valve is grossly normal. Tricuspid valve regurgitation is trivial. Aortic Valve: The aortic valve is tricuspid. Aortic valve regurgitation is not visualized. No aortic stenosis is present. Aortic valve mean gradient measures 6.0 mmHg. Aortic valve peak gradient measures 10.8 mmHg. Aortic valve area, by VTI measures 2.42  cm. Pulmonic Valve: The pulmonic valve was grossly normal. Pulmonic valve regurgitation is trivial. Aorta: The aortic root is normal in size and structure. Venous: The inferior vena cava is normal in size with less than 50% respiratory variability,  suggesting right atrial pressure of 8 mmHg. IAS/Shunts: No atrial level shunt detected by color flow Doppler.  LEFT VENTRICLE PLAX 2D LVIDd:         3.70 cm  Diastology LVIDs:         2.40 cm  LV e' medial:    9.17 cm/s LV PW:         1.10 cm  LV E/e' medial:  11.2 LV IVS:        1.10 cm  LV e' lateral:   9.17 cm/s LVOT diam:     2.00 cm  LV E/e' lateral: 11.2 LV SV:         58 LV SV Index:   33 LVOT Area:     3.14 cm  RIGHT VENTRICLE RV Basal diam:  2.30 cm RV Mid diam:    2.00 cm RV S prime:     27.70 cm/s TAPSE (M-mode): 3.3 cm LEFT ATRIUM             Index       RIGHT ATRIUM           Index LA diam:        3.30 cm 1.89 cm/m  RA Area:     10.80 cm LA Vol (A2C):   41.8 ml 23.96 ml/m RA Volume:   17.70 ml  10.15 ml/m LA Vol (A4C):   34.7 ml 19.89 ml/m LA Biplane Vol: 38.5 ml 22.07 ml/m  AORTIC VALVE AV Area (Vmax):    2.47 cm AV Area (Vmean):   2.20 cm AV Area (VTI):     2.42 cm AV Vmax:           164.00 cm/s AV Vmean:          109.000 cm/s AV VTI:            0.240 m AV Peak Grad:      10.8 mmHg AV Mean Grad:      6.0 mmHg LVOT Vmax:         129.00 cm/s LVOT Vmean:        76.400 cm/s LVOT VTI:          0.185 m LVOT/AV VTI ratio: 0.77  AORTA Ao Root diam: 3.20 cm MITRAL VALVE MV Area (PHT): 5.02 cm     SHUNTS MV Area VTI:   1.73 cm     Systemic VTI:  0.18 m MV Peak grad:  13.1 mmHg  Systemic Diam: 2.00 cm MV Mean grad:  6.0 mmHg MV Vmax:       1.81 m/s MV Vmean:      109.0 cm/s MV Decel Time: 151 msec MV E velocity: 103.00 cm/s MV A velocity: 168.00 cm/s MV E/A ratio:  0.61 Rozann Lesches MD Electronically signed by Rozann Lesches MD Signature Date/Time: 01/16/2021/4:08:15 PM    Final    ECHOCARDIOGRAM LIMITED  Result Date: 01/22/2021    ECHOCARDIOGRAM LIMITED REPORT   Patient Name:   ARIYAH SEDLACK Date of Exam: 01/22/2021 Medical Rec #:  [id number]     Height:       66.0 in Accession #:    [id number]    Weight:       161.6 lb Date of Birth:  Sep 27, 1970     BSA:          1.827 m Patient Age:    60  years      BP:           107/64 mmHg Patient Gender: F             HR:           88 bpm. Exam Location:  [name] Procedure: Limited Echo Indications:    Pericardial effusion  History:        Patient has prior history of Echocardiogram examinations, most                 recent 01/19/2021. COPD, Arrythmias:PVC, Signs/Symptoms:Chest                 Pain; Risk Factors:Diabetes. COVID +, Tobacco abuse, Sepsis.  Sonographer:    Wenda Low Referring Phys: [phone] Mylia Pondexter IMPRESSIONS  1. Left ventricular ejection fraction, by estimation, is 60 to 65%. The left ventricle has normal function.  2. Moderate pericardial effusion. The pericardial effusion is circumferential. There is no evidence of cardiac tamponade. Effusion is stable compared to the 01/19/21 study  3. The inferior vena cava is normal in size with greater than 50% respiratory variability, suggesting right atrial pressure of 3 mmHg.  4. Limited echo to evaluate pericardial effusion FINDINGS  Left Ventricle: Left ventricular ejection fraction, by estimation, is 60 to 65%. The left ventricle has normal function. Pericardium: A moderately sized pericardial effusion is present. The pericardial effusion is circumferential. There is no evidence of cardiac tamponade. Venous: The inferior vena cava is normal in size with greater than 50% respiratory variability, suggesting right atrial pressure of 3 mmHg. LEFT VENTRICLE PLAX 2D LVIDd:         3.20 cm LVIDs:         2.40 cm LV PW:         1.20 cm LV IVS:        0.90 cm LVOT diam:     2.10 cm LVOT Area:     3.46 cm  LEFT ATRIUM         Index LA diam:    3.20 cm 1.75 cm/m   AORTA Ao Root diam: 3.40 cm  SHUNTS Systemic Diam: 2.10 cm Carlyle Dolly MD Electronically signed by Carlyle Dolly MD Signature Date/Time: 01/22/2021/11:50:46 AM    Final    ECHOCARDIOGRAM LIMITED  Result Date: 01/19/2021    ECHOCARDIOGRAM LIMITED REPORT   Patient Name:   SHADIA LAROSE Date of Exam: 01/19/2021 Medical Rec #:  [id number]      Height:       66.0 in Accession #:    [id number]  Weight:       161.6 lb Date of Birth:  01-29-1971     BSA:          1.827 m Patient Age:    74 years      BP:           99/68 mmHg Patient Gender: F             HR:           96 bpm. Exam Location:  [name] Procedure: Limited Echo Indications:    Pericardial effusion  History:        Patient has prior history of Echocardiogram examinations, most                 recent 01/16/2021. COPD, Arrythmias:PVC, Signs/Symptoms:Chest                 Pain; Risk Factors:Diabetes. COVID +, Tobacco Abuse, Sepsis,                 Pericardial effusion.  Sonographer:    Wenda Low Referring Phys: [place]  1. Left ventricular ejection fraction, by estimation, is 60 to 65%. The left ventricle has normal function.  2. Moderate pericardial effusion. The pericardial effusion is circumferential. There is no evidence of cardiac tamponade. Effusion looks larger compared to the 01/16/2021 study however remains overall moderate by measurements. Recommend repeating limited  study in 72 hours. FINDINGS  Left Ventricle: Left ventricular ejection fraction, by estimation, is 60 to 65%. The left ventricle has normal function. Pericardium: A moderately sized pericardial effusion is present. The pericardial effusion is circumferential. There is no evidence of cardiac tamponade. LEFT VENTRICLE PLAX 2D LVIDd:         3.90 cm LVIDs:         2.40 cm LV PW:         1.00 cm LV IVS:        1.00 cm LVOT diam:     2.00 cm LVOT Area:     3.14 cm  LEFT ATRIUM         Index LA diam:    3.40 cm 1.86 cm/m   SHUNTS Systemic Diam: 2.00 cm Carlyle Dolly MD Electronically signed by Carlyle Dolly MD Signature Date/Time: 01/19/2021/11:20:45 AM    Final    [place] EKG SITE RITE  Result Date: 01/16/2021 If Site Rite image not attached, placement could not be confirmed due to current cardiac rhythm.   Orson Eva, DO  [facility]  If 7PM-7AM, please contact  night-coverage [email or web address] Password [email or web address] 02/04/2021, 7:06 AM   LOS: 1 day

## 2021-02-04 NOTE — Progress Notes (Signed)
Subjective: Patient endorses some abdominal pain, states that she had 3-4 BMs this morning, all were small and watery, she states she is passing a lot of gas. She noticed bright red blood upon wiping. She denies any rectal pain, burning or irritation. She reports she was able to eat breakfast this morning and tolerated it well. She has not required any anti emetics and has had some tylenol for pain. She does endorse a headache at this time. States she is hungry, has not received her lunch tray yet, unfortunately.   Objective: Vital signs in last 24 hours: Temp:  [97.7 F (36.5 C)-98.2 F (36.8 C)] 97.7 F (36.5 C) (10/05 0500) Pulse Rate:  [80-128] 100 (10/05 0927) Resp:  [9-22] 21 (10/05 0927) BP: (98-157)/(52-137) 125/60 (10/05 0927) SpO2:  [95 %-100 %] 99 % (10/05 0500) Weight:  [73.3 kg] 73.3 kg (10/04 1140)   General:   Alert and oriented, pleasant Head:  Normocephalic and atraumatic. Eyes:  No icterus, sclera clear. Conjuctiva pink.  Mouth:  Without lesions, mucosa pink and moist.  Heart:  S1, S2 present, no murmurs noted.  Lungs: Clear to auscultation bilaterally, without wheezing, rales, or rhonchi.  Abdomen:  Bowel sounds present, soft, non-tender. No HSM or hernias noted. No rebound or guarding. No masses appreciated. Mildly distended, per patient it is less distended than it was on admission. Msk:  Symmetrical without gross deformities. Normal posture. Pulses:  Normal pulses noted. Extremities:  Without clubbing or edema. Neurologic:  Alert and  oriented x4;  grossly normal neurologically. Skin:  Warm and dry, intact without significant lesions.  Psych:  Alert and cooperative. Normal mood and affect.   Lab Results: Recent Labs    02/03/21 1206 02/03/21 1304 02/04/21 0455  WBC 8.8  --  4.3  HGB 13.4 14.3 10.7*  HCT 40.9 42.0 34.2*  PLT 729*  --  486*   BMET Recent Labs    02/03/21 1206 02/03/21 1304 02/04/21 0455  NA 133* 135 136  K 3.2* 3.2* 3.7  CL 96*  96* 104  CO2 27  --  27  GLUCOSE 118* 120* 111*  BUN 8 7 <5*  CREATININE 0.86 0.70 0.77  CALCIUM 9.5  --  8.2*   LFT Recent Labs    02/03/21 1206  PROT 7.6  ALBUMIN 3.7  AST 41  ALT 29  ALKPHOS 121  BILITOT 0.4    Studies/Results: CT ABDOMEN PELVIS W CONTRAST  Result Date: 02/03/2021 CLINICAL DATA:  Abdominal abscess/infection suspected. Rectal bleeding for 7 days. Denies pain. History of COPD, HTN, DM, hyster, GB, oophorectomy EXAM: CT ABDOMEN AND PELVIS WITH CONTRAST TECHNIQUE: Multidetector CT imaging of the abdomen and pelvis was performed using the standard protocol following bolus administration of intravenous contrast. CONTRAST:  171m OMNIPAQUE IOHEXOL 300 MG/ML  SOLN COMPARISON:  CT abdomen pelvis 01/21/2021 FINDINGS: Lower chest: Partially visualized slightly improved trace pericardial effusion. Resolution of bilateral pleural effusions. No acute abnormality. Hepatobiliary: Focally hypodensity of the hepatic parenchyma along the falciform ligament likely represents focal fatty infiltration. No focal liver abnormality. Status post cholecystectomy. No biliary dilatation. Pancreas: No focal lesion. Normal pancreatic contour. No surrounding inflammatory changes. No main pancreatic ductal dilatation. Spleen: Normal in size without focal abnormality. Adrenals/Urinary Tract: No adrenal nodule bilaterally. Bilateral kidneys enhance symmetrically. No hydronephrosis. No hydroureter. The urinary bladder is unremarkable. Resolution of gas associated with the urinary bladder. On delayed imaging, there is no urothelial wall thickening and there are no filling defects in the opacified portions of  the bilateral collecting systems or ureters. Stomach/Bowel: Stomach is within normal limits. No evidence of small bowel wall thickening or dilatation. Slightly improved but persistent bowel wall thickening, submucosal hyperemia, pericolonic fat stranding of the descending colon and rectosigmoid colon.  Appendix appears normal. Vascular/Lymphatic: No abdominal aorta or iliac aneurysm. Mild noncalcified atherosclerotic plaque of the aorta and its branches. No abdominal, pelvic, or inguinal lymphadenopathy. Reproductive: Status post hysterectomy. No adnexal masses. Other: No intraperitoneal free fluid. No intraperitoneal free gas. No organized fluid collection. Musculoskeletal: No abdominal wall hernia or abnormality. No suspicious lytic or blastic osseous lesions. No acute displaced fracture. Multilevel degenerative changes of the spine. IMPRESSION: 1. Slightly improved but persistent distal colitis. 2. Partially visualized slightly improved trace pericardial effusion. Resolution of bilateral pleural effusions. 3.  Aortic Atherosclerosis (ICD10-I70.0). Electronically Signed   By: Iven Finn M.D.   On: 02/03/2021 15:12    Assessment: 50 year old female with history of COPD, HTN, Type II DM, GERD, IBS and questionable diagnosis of Crohn's disease in 1998 at [place], but numerous studies including colonoscopies and imaging since then w/o evidence of active Crohn's. Patient denies any previous treatements. She presented to the ER with c/o rectal bleeding x1 week, she was recently admitted in Sept 2022 with colitis. CT at that time with colitis extending from splenic flexure to rectum. She was treated with antibiotics with resolution of diarrhea and discharged on a 5-day course of Cipor and Flagyl. CT done this admission with slightly improved, but persistent distal colitis. Hgb WNL at 13.4 on admission.   Colitis:slight improvement, but persistent distal colitis of unclear etiology. Could be infectious etiology as she improved previously with antibiotic therapy, however, she did not complete her full course of abx due to n/v. Cannot r/o IBD consider prior questionable diagnosis of Crohn's and significant family hx of IBD. Having 3-4 watery BMs daily that are very small volume, endorses a lot of flatus. She  denies nausea or vomiting currently. Rectal bleeding continues to be small volume and only when wiping. Positive FOBT yesterday. Hemoglobin has dropped to 10.7 this morning, down from 14.3 yesterday, however, it appears this is close to her hgb level during sept 2022 admission and initial higher value could likely have been from hemoconcentration/dehydration. C diff testing was negative, GI Path panel pending. Continue empiric abx for now. If no clinical improvement or positive GI path panel, she will need colonoscopy for further evaluation of her symptoms. Last colonoscopy on file 2010 with anal canal internal hemorrhoids and papilla, grossly normal colon.   GERD: doing well on PPI. Continue PPI daily and d/c with Rx for protonix.  Chronic nausea vomiting: likely multifactorial in setting of uncontrolled GERD, constipation, suspected gastroparesis in setting of DM, currently exacerbated by acute illness.   Plan: Follow for GI path panel result Consider colonoscopy if GI path panel unrevealing and symptoms do not improve, otherwise will need outpatient colonoscopy Continue empiric abx Continue Anusol rectal cream BID Soft diet as toleraed Continue Protonix 7m daily Continue Zofran [id number] prn Analgesics per hospitalist Avoid NSAIDs Trend H&H and monitor for overt GI bleeding    LOS: 1 day    02/04/2021, 10:28 AM Kiele Heavrin L. CaAlver SorrowMSN, APRN, AGNP-C Adult-Gerontology Nurse Practitioner [facility]

## 2021-02-05 DIAGNOSIS — R197 Diarrhea, unspecified: Secondary | ICD-10-CM | POA: Diagnosis not present

## 2021-02-05 DIAGNOSIS — F17200 Nicotine dependence, unspecified, uncomplicated: Secondary | ICD-10-CM | POA: Diagnosis not present

## 2021-02-05 DIAGNOSIS — K529 Noninfective gastroenteritis and colitis, unspecified: Secondary | ICD-10-CM | POA: Diagnosis not present

## 2021-02-05 DIAGNOSIS — K921 Melena: Secondary | ICD-10-CM | POA: Diagnosis not present

## 2021-02-05 LAB — BASIC METABOLIC PANEL
Anion gap: 6 (ref 5–15)
BUN: 6 mg/dL (ref 6–20)
CO2: 27 mmol/L (ref 22–32)
Calcium: 8.3 mg/dL — ABNORMAL LOW (ref 8.9–10.3)
Chloride: 106 mmol/L (ref 98–111)
Creatinine, Ser: 0.9 mg/dL (ref 0.44–1.00)
GFR, Estimated: >60 mL/min (ref >60)
Glucose, Bld: 97 mg/dL (ref 70–99)
Potassium: 3.5 mmol/L (ref 3.5–5.1)
Sodium: 139 mmol/L (ref 135–145)

## 2021-02-05 LAB — CBC
HCT: 33.6 % — ABNORMAL LOW (ref 36.0–46.0)
Hemoglobin: 10.7 g/dL — ABNORMAL LOW (ref 12.0–15.0)
MCH: 30.7 pg (ref 26.0–34.0)
MCHC: 31.8 g/dL (ref 30.0–36.0)
MCV: 96.3 fL (ref 80.0–100.0)
Platelets: 507 10*3/uL — ABNORMAL HIGH (ref 150–400)
RBC: 3.49 MIL/uL — ABNORMAL LOW (ref 3.87–5.11)
RDW: 13.7 % (ref 11.5–15.5)
WBC: 5 10*3/uL (ref 4.0–10.5)
nRBC: 0 % (ref 0.0–0.2)

## 2021-02-05 LAB — URINE CULTURE: Culture: NO GROWTH

## 2021-02-05 LAB — GASTROINTESTINAL PANEL BY PCR, STOOL (REPLACES STOOL CULTURE)

## 2021-02-05 LAB — GLUCOSE, CAPILLARY
Glucose-Capillary: 98 mg/dL (ref 70–99)
Glucose-Capillary: 78 mg/dL (ref 70–99)
Glucose-Capillary: 80 mg/dL (ref 70–99)

## 2021-02-05 LAB — MAGNESIUM: Magnesium: 2 mg/dL (ref 1.7–2.4)

## 2021-02-05 MED ORDER — ONDANSETRON HCL 4 MG/2ML IJ SOLN
4.0000 mg | Freq: Four times a day (QID) | INTRAMUSCULAR | Status: DC | PRN
  Administered 2021-02-05 – 2021-02-21 (×15): 4 mg via INTRAVENOUS
  Filled 2021-02-05 (×15): qty 2

## 2021-02-05 NOTE — Progress Notes (Signed)
Subjective: Patient doing about the same today.  Continues to have diarrhea and hematochezia, not improved.  C. difficile and GI pathogen panel negative.  Objective: Vital signs in last 24 hours: Temp:  [97.6 F (36.4 C)-98.3 F (36.8 C)] 98.1 F (36.7 C) (10/06 1308) Pulse Rate:  [94-109] 94 (10/06 1308) Resp:  [14-18] 18 (10/06 1308) BP: (105-123)/(64-72) 123/64 (10/06 1308) SpO2:  [97 %-100 %] 100 % (10/06 1308) FiO2 (%):  [21 %] 21 % (10/06 1308) Last BM Date: 02/04/21 General:   Alert and oriented, pleasant Head:  Normocephalic and atraumatic. Eyes:  No icterus, sclera clear. Conjuctiva pink.  Mouth:  Without lesions, mucosa pink and moist.  Abdomen:  Bowel sounds present, soft, non-tender, non-distended. No HSM or hernias noted. No rebound or guarding. No masses appreciated  Msk:  Symmetrical without gross deformities. Normal posture. Pulses:  Normal pulses noted. Extremities:  Without clubbing or edema. Neurologic:  Alert and  oriented x4;  grossly normal neurologically. Skin:  Warm and dry, intact without significant lesions.  Cervical Nodes:  No significant cervical adenopathy. Psych:  Alert and cooperative. Normal mood and affect.  Intake/Output from previous day: 10/05 0701 - 10/06 0700 In: 1908.2 [P.O.:240; I.V.:1154.1; IV [id number]:826] Out: -  Intake/Output this shift: No intake/output data recorded.  Lab Results: Recent Labs    02/03/21 1206 02/03/21 1304 02/04/21 0455 02/05/21 0513  WBC 8.8  --  4.3 5.0  HGB 13.4 14.3 10.7* 10.7*  HCT 40.9 42.0 34.2* 33.6*  PLT 729*  --  486* 507*   BMET Recent Labs    02/03/21 1206 02/03/21 1304 02/04/21 0455 02/05/21 0513  NA 133* 135 136 139  K 3.2* 3.2* 3.7 3.5  CL 96* 96* 104 106  CO2 27  --  27 27  GLUCOSE 118* 120* 111* 97  BUN 8 7 <5* 6  CREATININE 0.86 0.70 0.77 0.90  CALCIUM 9.5  --  8.2* 8.3*   LFT Recent Labs    02/03/21 1206  PROT 7.6  ALBUMIN 3.7  AST 41  ALT 29  ALKPHOS 121   BILITOT 0.4   PT/INR No results for input(s): LABPROT, INR in the last 72 hours. Hepatitis Panel No results for input(s): HEPBSAG, HCVAB, HEPAIGM, HEPBIGM in the last 72 hours.   Studies/Results: No results found.  Assessment: *Colitis *Hematochezia *Diarrhea  Plan: Etiology of patient's colitis unclear.  GI pathogen panel and C. difficile negative.  We will plan for flexible sigmoidoscopy in the a.m. with biopsies.  Continue Anusol rectal cream twice daily.  N.p.o. after midnight.  Avoid NSAIDs.  Continue to monitor H&H and transfuse for less than 7.  Further recommendations to follow.  Elon Alas. Abbey Chatters, D.O. Gastroenterology and Hepatology [facility]   LOS: 2 days    02/05/2021, 7:13 PM

## 2021-02-05 NOTE — H&P (View-Only) (Signed)
Subjective: Patient doing about the same today.  Continues to have diarrhea and hematochezia, not improved.  C. difficile and GI pathogen panel negative.  Objective: Vital signs in last 24 hours: Temp:  [97.6 F (36.4 C)-98.3 F (36.8 C)] 98.1 F (36.7 C) (10/06 1308) Pulse Rate:  [94-109] 94 (10/06 1308) Resp:  [14-18] 18 (10/06 1308) BP: (105-123)/(64-72) 123/64 (10/06 1308) SpO2:  [97 %-100 %] 100 % (10/06 1308) FiO2 (%):  [21 %] 21 % (10/06 1308) Last BM Date: 02/04/21 General:   Alert and oriented, pleasant Head:  Normocephalic and atraumatic. Eyes:  No icterus, sclera clear. Conjuctiva pink.  Mouth:  Without lesions, mucosa pink and moist.  Abdomen:  Bowel sounds present, soft, non-tender, non-distended. No HSM or hernias noted. No rebound or guarding. No masses appreciated  Msk:  Symmetrical without gross deformities. Normal posture. Pulses:  Normal pulses noted. Extremities:  Without clubbing or edema. Neurologic:  Alert and  oriented x4;  grossly normal neurologically. Skin:  Warm and dry, intact without significant lesions.  Cervical Nodes:  No significant cervical adenopathy. Psych:  Alert and cooperative. Normal mood and affect.  Intake/Output from previous day: 10/05 0701 - 10/06 0700 In: 1908.2 [P.O.:240; I.V.:1154.1; IV [id number]] Out: -  Intake/Output this shift: No intake/output data recorded.  Lab Results: Recent Labs    02/03/21 1206 02/03/21 1304 02/04/21 0455 02/05/21 0513  WBC 8.8  --  4.3 5.0  HGB 13.4 14.3 10.7* 10.7*  HCT 40.9 42.0 34.2* 33.6*  PLT 729*  --  486* 507*   BMET Recent Labs    02/03/21 1206 02/03/21 1304 02/04/21 0455 02/05/21 0513  NA 133* 135 136 139  K 3.2* 3.2* 3.7 3.5  CL 96* 96* 104 106  CO2 27  --  27 27  GLUCOSE 118* 120* 111* 97  BUN 8 7 <5* 6  CREATININE 0.86 0.70 0.77 0.90  CALCIUM 9.5  --  8.2* 8.3*   LFT Recent Labs    02/03/21 1206  PROT 7.6  ALBUMIN 3.7  AST 41  ALT 29  ALKPHOS 121   BILITOT 0.4   PT/INR No results for input(s): LABPROT, INR in the last 72 hours. Hepatitis Panel No results for input(s): HEPBSAG, HCVAB, HEPAIGM, HEPBIGM in the last 72 hours.   Studies/Results: No results found.  Assessment: *Colitis *Hematochezia *Diarrhea  Plan: Etiology of patient's colitis unclear.  GI pathogen panel and C. difficile negative.  We will plan for flexible sigmoidoscopy in the a.m. with biopsies.  Continue Anusol rectal cream twice daily.  N.p.o. after midnight.  Avoid NSAIDs.  Continue to monitor H&H and transfuse for less than 7.  Further recommendations to follow.  Elon Alas. Abbey Chatters, D.O. Gastroenterology and Hepatology [facility]   LOS: 2 days    02/05/2021, 7:13 PM

## 2021-02-05 NOTE — Progress Notes (Signed)
PROGRESS NOTE  [name] NGE:[id number] DOB: May 21, 1970 DOA: 02/03/2021 PCP: Teodoro Kil, PA-C  Brief History:  50 y.o. female, with history of COPD, questionable Crohn's, GERD, HTN, T2DM, xanax withdrawal seizure, tobacco abuse presenting with 1 week history of hematochezia and intermittent nausea and vomiting.  The patient was recent admitted to the hospital from 01/16/2021 to 01/24/2021 secondary to sepsis due to colitis.  The patient was seen by GI and it was felt that the patient could be treated with antibiotics.  She was discharged home with 5 additional days of Cipro and Flagyl.  Any procedures were felt to be able to be deferred into the outpatient setting.  The patient states that she was noncompliant with her antibiotics.  She only took 1 day of her antibiotics after discharge which caused her to have nausea and vomiting.  As result, she stopped taking the antibiotics.  During her last hospitalization, the patient was also treated for Xanax withdrawal seizures as well as a COVID-19 infection for which she was largely asymptomatic. She denies any fevers, chills, chest pain, shortness breath, headache, neck pain, coughing, hemoptysis.  She continues to smoke occasionally. In the emergency department, the patient was afebrile hemodynamically stable with oxygen saturation 100% room air.  WBC 8.8, hemoglobin 13.4, platelets 729K.  Sodium 136, potassium 3.7, BUN less than 5, creatinine 0.86.  CT of the abdomen and pelvis showed slightly improved but persistent bowel wall thickening and pericolonic stranding involving the descending and rectosigmoid colon.  She was started on ceftriaxone and metronidazole.  GI was consulted to assist.  Assessment/Plan: Hematochezia/colitis -Likely secondary to the patient's colitis -She was seen by GI during her last hospital admission who felt this was more likely to be infectious rather than IBD. -review of medical records showed pt reports dx of  Crohn's diagnosed [facility] at [facility] but numerous studies including colonoscopy (05/24/2008) which showed normal terminal ileum and SBFT (07/26/2011) which showed normal SB have questioned if this was a true diagnosis -continue ceftriaxone and metronidazole -she is tolerating ceftriaxone without difficulty -Consult GI>>planning colonoscopy -C. difficile negative -Stool pathogen panel--neg -stool calprotectin   Hypokalemia/hypomagnesemia -Repleted   Anxiety -Restart Xanax home dose -PDMP reviewed shows that the patient received Xanax 1 mg, #90 monthly   Pericardial effusion -01/22/2021 echo EF 60 to 65%, moderate pericardial effusion unchanged from 01/19/2021 -Patient is hemodynamically stable   COPD -Stable on room air   Tobacco abuse -Tobacco cessation discussed   THC abuse -cessation discussed        Status is: Inpatient  Remains inpatient appropriate because:Inpatient level of care appropriate due to severity of illness  Dispo: The patient is from: Home              Anticipated d/c is to: Home              Patient currently is not medically stable to d/c.   Difficult to place patient No        Family Communication:  significant other at bedside 10/6  Consultants:  GI  Code Status:  FULL   DVT Prophylaxis:  SCDs   Procedures: As Listed in Progress Note Above  Antibiotics: Ceftriaxone 10/5>> Metronidazole 10/4>>      Subjective: Patient states abd pain is about the same.  Denies n/v.  Having loose stools with blood.  Denies f/c, cp, sob, melena, dysuria  Objective: Vitals:   02/04/21 1546 02/04/21 2018 02/05/21 4010 02/05/21 1308  BP: (!) 115/49 117/72 105/66 123/64  Pulse: 90 94 (!) 109 94  Resp: 16 14 16 18   Temp: 98 F (36.7 C) 98.3 F (36.8 C) 97.6 F (36.4 C) 98.1 F (36.7 C)  TempSrc: Oral Oral Oral Oral  SpO2: 98% 97% 98% 100%  Weight: 67.3 kg     Height: 5' 4"  (1.626 m)       Intake/Output Summary (Last 24 hours) at  02/05/2021 1706 Last data filed at 02/05/2021 1300 Gross per 24 hour  Intake 400 ml  Output --  Net 400 ml   Weight change: -6 kg Exam:  General:  Pt is alert, follows commands appropriately, not in acute distress HEENT: No icterus, No thrush, No neck mass, [place]/AT Cardiovascular: RRR, S1/S2, no rubs, no gallops Respiratory: CTA bilaterally, no wheezing, no crackles, no rhonchi Abdomen: Soft/+BS, diffuse mild tender, non distended, no guarding Extremities: No edema, No lymphangitis, No petechiae, No rashes, no synovitis   Data Reviewed: I have personally reviewed following labs and imaging studies Basic Metabolic Panel: Recent Labs  Lab 02/03/21 1206 02/03/21 1304 02/03/21 1635 02/04/21 0455 02/05/21 0513  NA 133* 135  --  136 139  K 3.2* 3.2*  --  3.7 3.5  CL 96* 96*  --  104 106  CO2 27  --   --  27 27  GLUCOSE 118* 120*  --  111* 97  BUN 8 7  --  <5* 6  CREATININE 0.86 0.70  --  0.77 0.90  CALCIUM 9.5  --   --  8.2* 8.3*  MG  --   --  1.6*  --  2.0   Liver Function Tests: Recent Labs  Lab 02/03/21 1206  AST 41  ALT 29  ALKPHOS 121  BILITOT 0.4  PROT 7.6  ALBUMIN 3.7   No results for input(s): LIPASE, AMYLASE in the last 168 hours. No results for input(s): AMMONIA in the last 168 hours. Coagulation Profile: No results for input(s): INR, PROTIME in the last 168 hours. CBC: Recent Labs  Lab 02/03/21 1206 02/03/21 1304 02/04/21 0455 02/05/21 0513  WBC 8.8  --  4.3 5.0  HGB 13.4 14.3 10.7* 10.7*  HCT 40.9 42.0 34.2* 33.6*  MCV 92.3  --  95.5 96.3  PLT 729*  --  486* 507*   Cardiac Enzymes: No results for input(s): CKTOTAL, CKMB, CKMBINDEX, TROPONINI in the last 168 hours. BNP: Invalid input(s): POCBNP CBG: Recent Labs  Lab 02/04/21 1732 02/04/21 2110 02/05/21 0748 02/05/21 1209 02/05/21 1636  GLUCAP 108* 85 98 78 80   HbA1C: Recent Labs    02/03/21 1206  HGBA1C 5.7*   Urine analysis:    Component Value Date/Time   COLORURINE YELLOW  02/04/2021 0956   APPEARANCEUR CLEAR 02/04/2021 0956   LABSPEC 1.005 02/04/2021 0956   PHURINE 6.0 02/04/2021 0956   GLUCOSEU NEGATIVE 02/04/2021 0956   GLUCOSEU NEG mg/dL 12/24/2009 2317   HGBUR MODERATE (A) 02/04/2021 0956   BILIRUBINUR NEGATIVE 02/04/2021 0956   KETONESUR NEGATIVE 02/04/2021 0956   PROTEINUR 30 (A) 02/04/2021 0956   UROBILINOGEN 0.2 12/26/2013 1530   NITRITE NEGATIVE 02/04/2021 0956   LEUKOCYTESUR MODERATE (A) 02/04/2021 0956   Sepsis Labs: @LABRCNTIP (procalcitonin:4,lacticidven:4) ) Recent Results (from the past 240 hour(s))  C Difficile Quick Screen w PCR reflex     Status: None   Collection Time: 02/03/21  9:49 PM   Specimen: STOOL  Result Value Ref Range Status   C Diff antigen NEGATIVE NEGATIVE Final  C Diff toxin NEGATIVE NEGATIVE Final   C Diff interpretation No C. difficile detected.  Final    Comment: Performed at [facility], [address] [place], [place] [address]  Gastrointestinal Panel by PCR , Stool     Status: None   Collection Time: 02/03/21  9:49 PM   Specimen: STOOL  Result Value Ref Range Status   Campylobacter species NOT DETECTED NOT DETECTED Final   Plesimonas shigelloides NOT DETECTED NOT DETECTED Final   Salmonella species NOT DETECTED NOT DETECTED Final   Yersinia enterocolitica NOT DETECTED NOT DETECTED Final   Vibrio species NOT DETECTED NOT DETECTED Final   Vibrio cholerae NOT DETECTED NOT DETECTED Final   Enteroaggregative E coli (EAEC) NOT DETECTED NOT DETECTED Final   Enteropathogenic E coli (EPEC) NOT DETECTED NOT DETECTED Final   Enterotoxigenic E coli (ETEC) NOT DETECTED NOT DETECTED Final   Shiga like toxin producing E coli (STEC) NOT DETECTED NOT DETECTED Final   Shigella/Enteroinvasive E coli (EIEC) NOT DETECTED NOT DETECTED Final   Cryptosporidium NOT DETECTED NOT DETECTED Final   Cyclospora cayetanensis NOT DETECTED NOT DETECTED Final   Entamoeba histolytica NOT DETECTED NOT DETECTED Final   Giardia lamblia NOT  DETECTED NOT DETECTED Final   Adenovirus F40/41 NOT DETECTED NOT DETECTED Final   Astrovirus NOT DETECTED NOT DETECTED Final   Norovirus GI/GII NOT DETECTED NOT DETECTED Final   Rotavirus A NOT DETECTED NOT DETECTED Final   Sapovirus (I, II, IV, and V) NOT DETECTED NOT DETECTED Final    Comment: Performed at [facility], [address] [place], [place] [address]  Urine Culture     Status: None   Collection Time: 02/04/21  9:56 AM   Specimen: Urine, Clean Catch  Result Value Ref Range Status   Specimen Description   Final    URINE, CLEAN CATCH Performed at [facility], [address], [place], [place] [address]    Special Requests   Final    NONE Performed at [facility], [address] [place], [place] [address]    Culture   Final    NO GROWTH Performed at [facility], [address], [place],  [address]    Report Status 02/05/2021 FINAL  Final     Scheduled Meds:  ALPRAZolam  1 mg Oral TID   hydrocortisone   Rectal BID   insulin aspart  0-5 Units Subcutaneous QHS   insulin aspart  0-9 Units Subcutaneous TID WC   pantoprazole  40 mg Oral Daily   Continuous Infusions:  cefTRIAXone (ROCEPHIN)  IV 2 g (02/05/21 0857)   levETIRAcetam 750 mg (02/05/21 0830)   metronidazole 500 mg (02/05/21 1249)    Procedures/Studies: CT HEAD WO CONTRAST (5MM)  Result Date: 01/16/2021 CLINICAL DATA:  Status post seizure. EXAM: CT HEAD WITHOUT CONTRAST TECHNIQUE: Contiguous axial images were obtained from the base of the skull through the vertex without intravenous contrast. COMPARISON:  June 03, 2020 FINDINGS: Brain: No evidence of acute infarction, hemorrhage, hydrocephalus, extra-axial collection or mass lesion/mass effect. Vascular: No hyperdense vessel or unexpected calcification. Skull: Normal. Negative for fracture or focal lesion. Sinuses/Orbits: A small right maxillary sinus air-fluid level is seen. Other: None. IMPRESSION: 1. No acute intracranial  abnormality. Electronically Signed   By: Virgina Norfolk M.D.   On: 01/16/2021 01:08   CT ABDOMEN PELVIS W CONTRAST  Result Date: 02/03/2021 CLINICAL DATA:  Abdominal abscess/infection suspected. Rectal bleeding for 7 days. Denies pain. History of COPD, HTN, DM, hyster, GB,  oophorectomy EXAM: CT ABDOMEN AND PELVIS WITH CONTRAST TECHNIQUE: Multidetector CT imaging of the abdomen and pelvis was performed using the standard protocol following bolus administration of intravenous contrast. CONTRAST:  177m OMNIPAQUE IOHEXOL 300 MG/ML  SOLN COMPARISON:  CT abdomen pelvis 01/21/2021 FINDINGS: Lower chest: Partially visualized slightly improved trace pericardial effusion. Resolution of bilateral pleural effusions. No acute abnormality. Hepatobiliary: Focally hypodensity of the hepatic parenchyma along the falciform ligament likely represents focal fatty infiltration. No focal liver abnormality. Status post cholecystectomy. No biliary dilatation. Pancreas: No focal lesion. Normal pancreatic contour. No surrounding inflammatory changes. No main pancreatic ductal dilatation. Spleen: Normal in size without focal abnormality. Adrenals/Urinary Tract: No adrenal nodule bilaterally. Bilateral kidneys enhance symmetrically. No hydronephrosis. No hydroureter. The urinary bladder is unremarkable. Resolution of gas associated with the urinary bladder. On delayed imaging, there is no urothelial wall thickening and there are no filling defects in the opacified portions of the bilateral collecting systems or ureters. Stomach/Bowel: Stomach is within normal limits. No evidence of small bowel wall thickening or dilatation. Slightly improved but persistent bowel wall thickening, submucosal hyperemia, pericolonic fat stranding of the descending colon and rectosigmoid colon. Appendix appears normal. Vascular/Lymphatic: No abdominal aorta or iliac aneurysm. Mild noncalcified atherosclerotic plaque of the aorta and its branches. No  abdominal, pelvic, or inguinal lymphadenopathy. Reproductive: Status post hysterectomy. No adnexal masses. Other: No intraperitoneal free fluid. No intraperitoneal free gas. No organized fluid collection. Musculoskeletal: No abdominal wall hernia or abnormality. No suspicious lytic or blastic osseous lesions. No acute displaced fracture. Multilevel degenerative changes of the spine. IMPRESSION: 1. Slightly improved but persistent distal colitis. 2. Partially visualized slightly improved trace pericardial effusion. Resolution of bilateral pleural effusions. 3.  Aortic Atherosclerosis (ICD10-I70.0). Electronically Signed   By: MIven FinnM.D.   On: 02/03/2021 15:12   CT ABDOMEN PELVIS W CONTRAST  Result Date: 01/21/2021 CLINICAL DATA:  Abdominal pain, acute nonlocalized fever, leukocytosis. Abdominal abscess/infection suspected. COVID positive. History of COPD, collagen vascular disease and Crohn's disease. EXAM: CT ABDOMEN AND PELVIS WITH CONTRAST TECHNIQUE: Multidetector CT imaging of the abdomen and pelvis was performed using the standard protocol following bolus administration of intravenous contrast. CONTRAST:  870mOMNIPAQUE IOHEXOL 350 MG/ML SOLN COMPARISON:  CT 09/14/2015. FINDINGS: Lower chest: New small to moderate dependent pleural effusions with associated bibasilar atelectasis at both lung bases. There is a small pericardial effusion as well. The heart size is normal. No acute vascular findings are evident. Hepatobiliary: The liver is normal in density without suspicious focal abnormality. No significant biliary dilatation status post cholecystectomy. Pancreas: Unremarkable. No pancreatic ductal dilatation or surrounding inflammatory changes. Spleen: Normal in size without focal abnormality. Adrenals/Urinary Tract: Both adrenal glands appear normal. Both kidneys appear normal. No evidence of urinary tract calculus, hydronephrosis or perinephric soft tissue stranding. The urinary bladder is  moderately distended. There is a small amount of air within the bladder lumen. No bladder wall thickening or immediate surrounding inflammatory changes identified. Stomach/Bowel: Enteric contrast was administered and has passed into the mid small bowel. The stomach appears normal for its degree of distention. There is no significant small bowel distension, wall thickening or surrounding inflammation. By report, the patient is status post appendectomy. The proximal colon is fluid-filled and mildly distended. There is diffuse wall thickening of the colon extending from the splenic flexure to the rectum. Wall thickening and enhancement are most prominent within the proximal rectum. Distally, the rectal walls are less well-defined, and distal rectal perforation is difficult to exclude. There is  moderate perirectal soft tissue stranding. These inflammatory changes do not appear to directly abut the posterior wall of the bladder. No discrete fistula are seen. Vascular/Lymphatic: There are no enlarged abdominal or pelvic lymph nodes. No acute vascular findings. The portal, superior mesenteric and splenic veins are patent. Reproductive: Hysterectomy. No adnexal mass. There is no gas within the vagina. Other: Small amount of pelvic ascites. As above, possible perirectal fluid without drainable fluid collection or fistula. No other extraluminal fluid collections are identified. There is no free air. Musculoskeletal: No acute or significant osseous findings. No evidence of discitis or sacroiliitis. IMPRESSION: 1. Findings are consistent with distal colitis, extending from the splenic flexure of the colon into the rectum. This colitis is presumably secondary to the patient's Crohn's disease. 2. The walls of the distal rectum are ill-defined, and rectal perforation difficult to exclude. No drainable fluid collection identified. 3. Gas within the wall of the bladder without bladder wall thickening or immediate surrounding  inflammatory changes. The gas could be iatrogenic, although if catheterization has not been recently performed, a colovesical fistula would be a consideration. 4. No evidence of small-bowel inflammation or obstruction. 5. Bilateral pleural effusions with small pericardial effusion and bibasilar atelectasis. 6. These results will be called to the ordering clinician or representative by the Radiologist Assistant, and communication documented in the PACS or [employer]. Electronically Signed   By: Richardean Sale M.D.   On: 01/21/2021 16:45   US RENAL  Result Date: 01/16/2021 CLINICAL DATA:  Acute kidney injury EXAM: RENAL / URINARY TRACT ULTRASOUND COMPLETE COMPARISON:  None. FINDINGS: Right Kidney: Renal measurements: 10.7 x 5.0 x 5.5 cm = volume: 156 mL. Echogenicity within normal limits. No mass or hydronephrosis visualized. Left Kidney: Renal measurements: 10.3 x 6.0 x 5.3 cm = volume: 172 mL. Echogenicity within normal limits. No mass or hydronephrosis visualized. Bladder: Appears normal for degree of bladder distention. Bilateral ureteral jets are not noted, and the patient is not able to void at the time of the examination. Other: None. IMPRESSION: 1. Unremarkable sonographic appearance of the kidneys. No hydronephrosis. 2. Bilateral ureteral jets are not noted, and the patient is not able to void at the time of the examination. Correlate for urinary retention. Electronically Signed   By: Eddie Candle M.D.   On: 01/16/2021 13:04   US Carotid Bilateral  Result Date: 01/16/2021 CLINICAL DATA:  Altered mental status, seizures EXAM: BILATERAL CAROTID DUPLEX ULTRASOUND TECHNIQUE: Pearline Cables scale imaging, color Doppler and duplex ultrasound were performed of bilateral carotid and vertebral arteries in the neck. COMPARISON:  None. FINDINGS: Criteria: Quantification of carotid stenosis is based on velocity parameters that correlate the residual internal carotid diameter with NASCET-based stenosis levels, using  the diameter of the distal internal carotid lumen as the denominator for stenosis measurement. The following velocity measurements were obtained: RIGHT ICA: 127/43 cm/sec CCA: 384/66 cm/sec SYSTOLIC ICA/CCA RATIO:  0.9 ECA:  118 cm/sec LEFT ICA: 145/41 cm/sec CCA: 599/35 cm/sec SYSTOLIC ICA/CCA RATIO:  1.1 ECA:  107 cm/sec RIGHT CAROTID ARTERY: No significant atherosclerotic plaque or evidence of stenosis in the internal carotid artery. RIGHT VERTEBRAL ARTERY:  Patent with normal antegrade flow. LEFT CAROTID ARTERY: Trace smooth heterogeneous atherosclerotic plaque in the proximal internal carotid artery. By peak systolic velocity criteria in the region of plaque, the estimated stenosis is less than 50%. LEFT VERTEBRAL ARTERY:  Patent with normal antegrade flow. IMPRESSION: 1. Mild (1-49%) stenosis proximal left internal carotid artery secondary to trace smooth heterogeneous atherosclerotic plaque. 2.  No significant atherosclerotic plaque or evidence of stenosis in the right internal carotid artery. 3. Vertebral arteries are patent with normal antegrade flow. Signed, Criselda Peaches, MD, [facility] Electronically Signed   By: Jacqulynn Cadet M.D.   On: 01/16/2021 11:00   DG CHEST PORT 1 VIEW  Result Date: 01/17/2021 CLINICAL DATA:  Severe sepsis.  Patient is COVID-19 positive. EXAM: PORTABLE CHEST 1 VIEW COMPARISON:  January 16, 2021 FINDINGS: Heart, hila, mediastinum are normal. No pneumothorax. No nodules or masses. Minimal haziness in the left costophrenic angle, likely atelectasis. No suspicious infiltrates are identified. IMPRESSION: Minimal haziness in the left costophrenic angle is favored to represent atelectasis. No other abnormalities are identified. No suspicious infiltrates. Electronically Signed   By: Dorise Bullion III M.D.   On: 01/17/2021 08:13   DG Chest Port 1 View  Result Date: 01/16/2021 CLINICAL DATA:  Seizure.  EXAM: PORTABLE CHEST 1 VIEW COMPARISON:  May 09, 2016 FINDINGS: The heart size and mediastinal contours are within normal limits. Both lungs are clear. The visualized skeletal structures are unremarkable. IMPRESSION: No active disease. Electronically Signed   By: Virgina Norfolk M.D.   On: 01/16/2021 01:02   DG Foot 2 Views Right  Result Date: 01/19/2021 CLINICAL DATA:  Recent fall with bruising EXAM: RIGHT FOOT - 2 VIEW COMPARISON:  None. FINDINGS: There is no evidence of fracture or dislocation. There is no evidence of arthropathy or other focal bone abnormality. Soft tissues are unremarkable. IMPRESSION: Negative. Electronically Signed   By: Jorje Guild M.D.   On: 01/19/2021 11:14   EEG adult  Result Date: 01/16/2021 Lora Havens, MD     01/16/2021 10:16 PM Patient Name: Hayde Kilgour MRN: [id number] Epilepsy Attending: Lora Havens Referring Physician/Provider: Dr Clearence Ped, [place] B Date: 01/16/2021 Duration: 22.32 mins Patient history: 50 yo F with AMS and metabolic changes, Likely due to xanax withdrawal seizures. EEG to evaluate for seizure Level of alertness: Awake AEDs during EEG study: LEV, GBP Technical aspects: This EEG study was done with scalp electrodes positioned according to the 10-20 International system of electrode placement. Electrical activity was acquired at a sampling rate of 500Hz  and reviewed with a high frequency filter of 70Hz  and a low frequency filter of 1Hz . EEG data were recorded continuously and digitally stored. Description: The posterior dominant rhythm consists of 9 Hz activity of moderate voltage (25-35 uV) seen predominantly in posterior head regions, symmetric and reactive to eye opening and eye closing. EEG showed intermittent generalized 5 to 6 Hz theta slowing. Hyperventilation and photic stimulation were not performed.   ABNORMALITY - Intermittent slow, generalized IMPRESSION: This study is suggestive of mild diffuse encephalopathy, nonspecific  etiology. No seizures or epileptiform discharges were seen throughout the recording. Lora Havens   ECHOCARDIOGRAM COMPLETE  Result Date: 01/16/2021    ECHOCARDIOGRAM REPORT   Patient Name:   ALFONSO SHACKETT Date of Exam: 01/16/2021 Medical Rec #:  [id number]     Height:       66.0 in Accession #:    [id number]    Weight:       145.0 lb Date of Birth:  02-Jan-1971     BSA:          1.744 m Patient Age:    35 years      BP:           81/20 mmHg Patient Gender: F  HR:           111 bpm. Exam Location:  [name] Procedure: 2D Echo, Cardiac Doppler and Color Doppler Indications:    Syncope  History:        Patient has prior history of Echocardiogram examinations, most                 recent 07/11/2010. COPD, Signs/Symptoms:Dyspnea; Risk                 Factors:Tobacco abuse and Diabetes. COVID +.  Sonographer:    Wenda Low Referring Phys: ASIA B Sisters  1. Left ventricular ejection fraction, by estimation, is 70 to 75%. The left ventricle has hyperdynamic function. The left ventricle has no regional wall motion abnormalities. There is mild left ventricular hypertrophy. Left ventricular diastolic parameters are indeterminate.  2. Right ventricular systolic function is normal. The right ventricular size is normal. Tricuspid regurgitation signal is inadequate for assessing PA pressure.  3. No right atrial or right ventricular collapse to suggest tamponade physiology. Respiratory variation in mitral outflow not assessed. Presence of sinus tachycardia does raise concern however for potential hemodynamic significance. Follow-up clinically  and suggest repeat limited echocardiogram within tht next 48 hours. Moderate pericardial effusion. The pericardial effusion is circumferential.  4. The mitral valve is grossly normal. Mild mitral valve regurgitation.  5. The aortic valve is tricuspid. Aortic valve regurgitation is not visualized. No aortic stenosis is present. Aortic valve mean  gradient measures 6.0 mmHg.  6. The inferior vena cava is normal in size with <50% respiratory variability, suggesting right atrial pressure of 8 mmHg. Comparison(s): No prior Echocardiogram. FINDINGS  Left Ventricle: Left ventricular ejection fraction, by estimation, is 70 to 75%. The left ventricle has hyperdynamic function. The left ventricle has no regional wall motion abnormalities. The left ventricular internal cavity size was normal in size. There is mild left ventricular hypertrophy. Left ventricular diastolic parameters are indeterminate. Right Ventricle: The right ventricular size is normal. No increase in right ventricular wall thickness. Right ventricular systolic function is normal. Tricuspid regurgitation signal is inadequate for assessing PA pressure. Left Atrium: Left atrial size was normal in size. Right Atrium: Right atrial size was normal in size. Pericardium: No right atrial or right ventricular collapse to suggest tamponade physiology. Respiratory variation in mitral outflow not assessed. Presence of sinus tachycardia does raise concern however for potential hemodynamic significance. Follow-up clinically and suggest repeat limited echocardiogram within tht next 48 hours. A moderately sized pericardial effusion is present. The pericardial effusion is circumferential. Mitral Valve: The mitral valve is grossly normal. Mild mitral valve regurgitation. MV peak gradient, 13.1 mmHg. The mean mitral valve gradient is 6.0 mmHg. Tricuspid Valve: The tricuspid valve is grossly normal. Tricuspid valve regurgitation is trivial. Aortic Valve: The aortic valve is tricuspid. Aortic valve regurgitation is not visualized. No aortic stenosis is present. Aortic valve mean gradient measures 6.0 mmHg. Aortic valve peak gradient measures 10.8 mmHg. Aortic valve area, by VTI measures 2.42  cm. Pulmonic Valve: The pulmonic valve was grossly normal. Pulmonic valve regurgitation is trivial. Aorta: The aortic root is  normal in size and structure. Venous: The inferior vena cava is normal in size with less than 50% respiratory variability, suggesting right atrial pressure of 8 mmHg. IAS/Shunts: No atrial level shunt detected by color flow Doppler.  LEFT VENTRICLE PLAX 2D LVIDd:         3.70 cm  Diastology LVIDs:         2.40 cm  LV e' medial:    9.17 cm/s LV PW:         1.10 cm  LV E/e' medial:  11.2 LV IVS:        1.10 cm  LV e' lateral:   9.17 cm/s LVOT diam:     2.00 cm  LV E/e' lateral: 11.2 LV SV:         58 LV SV Index:   33 LVOT Area:     3.14 cm  RIGHT VENTRICLE RV Basal diam:  2.30 cm RV Mid diam:    2.00 cm RV S prime:     27.70 cm/s TAPSE (M-mode): 3.3 cm LEFT ATRIUM             Index       RIGHT ATRIUM           Index LA diam:        3.30 cm 1.89 cm/m  RA Area:     10.80 cm LA Vol (A2C):   41.8 ml 23.96 ml/m RA Volume:   17.70 ml  10.15 ml/m LA Vol (A4C):   34.7 ml 19.89 ml/m LA Biplane Vol: 38.5 ml 22.07 ml/m  AORTIC VALVE AV Area (Vmax):    2.47 cm AV Area (Vmean):   2.20 cm AV Area (VTI):     2.42 cm AV Vmax:           164.00 cm/s AV Vmean:          109.000 cm/s AV VTI:            0.240 m AV Peak Grad:      10.8 mmHg AV Mean Grad:      6.0 mmHg LVOT Vmax:         129.00 cm/s LVOT Vmean:        76.400 cm/s LVOT VTI:          0.185 m LVOT/AV VTI ratio: 0.77  AORTA Ao Root diam: 3.20 cm MITRAL VALVE MV Area (PHT): 5.02 cm     SHUNTS MV Area VTI:   1.73 cm     Systemic VTI:  0.18 m MV Peak grad:  13.1 mmHg    Systemic Diam: 2.00 cm MV Mean grad:  6.0 mmHg MV Vmax:       1.81 m/s MV Vmean:      109.0 cm/s MV Decel Time: 151 msec MV E velocity: 103.00 cm/s MV A velocity: 168.00 cm/s MV E/A ratio:  0.61 Rozann Lesches MD Electronically signed by Rozann Lesches MD Signature Date/Time: 01/16/2021/4:08:15 PM    Final    ECHOCARDIOGRAM LIMITED  Result Date: 01/22/2021    ECHOCARDIOGRAM LIMITED REPORT   Patient Name:   AMORAH SEBRING Date of Exam: 01/22/2021 Medical Rec #:  [id number]     Height:       66.0 in  Accession #:    [id number]    Weight:       161.6 lb Date of Birth:  12/16/1970     BSA:          1.827 m Patient Age:    30 years      BP:           107/64 mmHg Patient Gender: F             HR:           88 bpm. Exam Location:  [name] Procedure: Limited Echo Indications:    Pericardial effusion  History:  Patient has prior history of Echocardiogram examinations, most                 recent 01/19/2021. COPD, Arrythmias:PVC, Signs/Symptoms:Chest                 Pain; Risk Factors:Diabetes. COVID +, Tobacco abuse, Sepsis.  Sonographer:    Wenda Low Referring Phys: [phone] Yanet Balliet IMPRESSIONS  1. Left ventricular ejection fraction, by estimation, is 60 to 65%. The left ventricle has normal function.  2. Moderate pericardial effusion. The pericardial effusion is circumferential. There is no evidence of cardiac tamponade. Effusion is stable compared to the 01/19/21 study  3. The inferior vena cava is normal in size with greater than 50% respiratory variability, suggesting right atrial pressure of 3 mmHg.  4. Limited echo to evaluate pericardial effusion FINDINGS  Left Ventricle: Left ventricular ejection fraction, by estimation, is 60 to 65%. The left ventricle has normal function. Pericardium: A moderately sized pericardial effusion is present. The pericardial effusion is circumferential. There is no evidence of cardiac tamponade. Venous: The inferior vena cava is normal in size with greater than 50% respiratory variability, suggesting right atrial pressure of 3 mmHg. LEFT VENTRICLE PLAX 2D LVIDd:         3.20 cm LVIDs:         2.40 cm LV PW:         1.20 cm LV IVS:        0.90 cm LVOT diam:     2.10 cm LVOT Area:     3.46 cm  LEFT ATRIUM         Index LA diam:    3.20 cm 1.75 cm/m   AORTA Ao Root diam: 3.40 cm  SHUNTS Systemic Diam: 2.10 cm Carlyle Dolly MD Electronically signed by Carlyle Dolly MD Signature Date/Time: 01/22/2021/11:50:46 AM    Final    ECHOCARDIOGRAM LIMITED  Result Date:  01/19/2021    ECHOCARDIOGRAM LIMITED REPORT   Patient Name:   [facility] Date of Exam: 01/19/2021 Medical Rec #:  [id number]     Height:       66.0 in Accession #:    [id number]    Weight:       161.6 lb Date of Birth:  [birth year]     BSA:          1.827 m Patient Age:    14 years      BP:           99/68 mmHg Patient Gender: F             HR:           96 bpm. Exam Location:  [name] Procedure: Limited Echo Indications:    Pericardial effusion  History:        Patient has prior history of Echocardiogram examinations, most                 recent 01/16/2021. COPD, Arrythmias:PVC, Signs/Symptoms:Chest                 Pain; Risk Factors:Diabetes. COVID +, Tobacco Abuse, Sepsis,                 Pericardial effusion.  Sonographer:    Wenda Low Referring Phys: Bertrand  1. Left ventricular ejection fraction, by estimation, is 60 to 65%. The left ventricle has normal function.  2. Moderate pericardial effusion. The pericardial effusion is circumferential. There is no evidence of cardiac tamponade.  Effusion looks larger compared to the 01/16/2021 study however remains overall moderate by measurements. Recommend repeating limited  study in 72 hours. FINDINGS  Left Ventricle: Left ventricular ejection fraction, by estimation, is 60 to 65%. The left ventricle has normal function. Pericardium: A moderately sized pericardial effusion is present. The pericardial effusion is circumferential. There is no evidence of cardiac tamponade. LEFT VENTRICLE PLAX 2D LVIDd:         3.90 cm LVIDs:         2.40 cm LV PW:         1.00 cm LV IVS:        1.00 cm LVOT diam:     2.00 cm LVOT Area:     3.14 cm  LEFT ATRIUM         Index LA diam:    3.40 cm 1.86 cm/m   SHUNTS Systemic Diam: 2.00 cm Carlyle Dolly MD Electronically signed by Carlyle Dolly MD Signature Date/Time: 01/19/2021/11:20:45 AM    Final    [place] EKG SITE RITE  Result Date: 01/16/2021 If Site Rite image not attached, placement could not  be confirmed due to current cardiac rhythm.   Orson Eva, DO  [facility]  If 7PM-7AM, please contact night-coverage [email or web address] Password [email or web address] 02/05/2021, 5:06 PM   LOS: 2 days

## 2021-02-06 ENCOUNTER — Encounter (HOSPITAL_COMMUNITY)
Admission: EM | Disposition: A | Discharge status: Home / Self Care | Payer: Self-pay | Source: Home / Self Care | Attending: Family Medicine

## 2021-02-06 ENCOUNTER — Encounter (HOSPITAL_COMMUNITY): Payer: Self-pay | Admitting: Internal Medicine

## 2021-02-06 ENCOUNTER — Inpatient Hospital Stay (HOSPITAL_COMMUNITY): Payer: 59 | Admitting: Certified Registered Nurse Anesthetist

## 2021-02-06 DIAGNOSIS — F17200 Nicotine dependence, unspecified, uncomplicated: Secondary | ICD-10-CM | POA: Diagnosis not present

## 2021-02-06 DIAGNOSIS — K6389 Other specified diseases of intestine: Secondary | ICD-10-CM | POA: Diagnosis not present

## 2021-02-06 DIAGNOSIS — K921 Melena: Secondary | ICD-10-CM | POA: Diagnosis not present

## 2021-02-06 DIAGNOSIS — K529 Noninfective gastroenteritis and colitis, unspecified: Secondary | ICD-10-CM | POA: Diagnosis not present

## 2021-02-06 DIAGNOSIS — U071 COVID-19: Secondary | ICD-10-CM | POA: Diagnosis not present

## 2021-02-06 HISTORY — PX: FLEXIBLE SIGMOIDOSCOPY: SHX5431

## 2021-02-06 HISTORY — PX: BIOPSY: SHX5522

## 2021-02-06 LAB — BASIC METABOLIC PANEL
Anion gap: 6 (ref 5–15)
BUN: 5 mg/dL — ABNORMAL LOW (ref 6–20)
CO2: 27 mmol/L (ref 22–32)
Calcium: 9 mg/dL (ref 8.9–10.3)
Chloride: 108 mmol/L (ref 98–111)
Creatinine, Ser: 0.77 mg/dL (ref 0.44–1.00)
GFR, Estimated: >60 mL/min (ref >60)
Glucose, Bld: 89 mg/dL (ref 70–99)
Potassium: 3.7 mmol/L (ref 3.5–5.1)
Sodium: 141 mmol/L (ref 135–145)

## 2021-02-06 LAB — CBC
HCT: 35.8 % — ABNORMAL LOW (ref 36.0–46.0)
Hemoglobin: 11.4 g/dL — ABNORMAL LOW (ref 12.0–15.0)
MCH: 30.7 pg (ref 26.0–34.0)
MCHC: 31.8 g/dL (ref 30.0–36.0)
MCV: 96.5 fL (ref 80.0–100.0)
Platelets: 450 10*3/uL — ABNORMAL HIGH (ref 150–400)
RBC: 3.71 MIL/uL — ABNORMAL LOW (ref 3.87–5.11)
RDW: 13.7 % (ref 11.5–15.5)
WBC: 4.1 10*3/uL (ref 4.0–10.5)
nRBC: 0 % (ref 0.0–0.2)

## 2021-02-06 LAB — GLUCOSE, CAPILLARY
Glucose-Capillary: 81 mg/dL (ref 70–99)
Glucose-Capillary: 105 mg/dL — ABNORMAL HIGH (ref 70–99)

## 2021-02-06 LAB — MAGNESIUM: Magnesium: 2 mg/dL (ref 1.7–2.4)

## 2021-02-06 LAB — SARS CORONAVIRUS 2 BY RT PCR (HOSPITAL ORDER, PERFORMED IN ~~LOC~~ HOSPITAL LAB): SARS Coronavirus 2: NEGATIVE

## 2021-02-06 SURGERY — SIGMOIDOSCOPY, FLEXIBLE
Anesthesia: General

## 2021-02-06 MED ORDER — METHYLPREDNISOLONE SODIUM SUCC 125 MG IJ SOLR
60.0000 mg | Freq: Every day | INTRAMUSCULAR | Status: DC
  Administered 2021-02-06 – 2021-02-20 (×15): 60 mg via INTRAVENOUS
  Filled 2021-02-06 (×15): qty 2

## 2021-02-06 MED ORDER — SODIUM CHLORIDE 0.9 % IV SOLN: INTRAVENOUS | Status: DC

## 2021-02-06 MED ORDER — PROPOFOL 10 MG/ML IV BOLUS
INTRAVENOUS | Status: DC | PRN
  Administered 2021-02-06: 50 mg via INTRAVENOUS
  Administered 2021-02-06: 100 mg via INTRAVENOUS

## 2021-02-06 MED ORDER — LACTATED RINGERS IV SOLN: INTRAVENOUS | Status: DC

## 2021-02-06 NOTE — Anesthesia Preprocedure Evaluation (Signed)
Anesthesia Evaluation  Patient identified by MRN, date of birth, ID band Patient awake    Reviewed: Allergy & Precautions, H&P , NPO status , Patient's Chart, lab work & pertinent test results, reviewed documented beta blocker date and time   Airway Mallampati: II  TM Distance: >3 FB Neck ROM: full    Dental no notable dental hx.    Pulmonary asthma , COPD, Current Smoker,    Pulmonary exam normal breath sounds clear to auscultation       Cardiovascular Exercise Tolerance: Good hypertension, negative cardio ROS   Rhythm:regular Rate:Normal     Neuro/Psych Seizures -,  PSYCHIATRIC DISORDERS Anxiety  Neuromuscular disease    GI/Hepatic Neg liver ROS, GERD  Medicated,  Endo/Other  negative endocrine ROSdiabetes  Renal/GU ARFRenal disease  negative genitourinary   Musculoskeletal   Abdominal   Peds  Hematology negative hematology ROS (+)   Anesthesia Other Findings   Reproductive/Obstetrics negative OB ROS                             Anesthesia Physical Anesthesia Plan  ASA: 3 and emergent  Anesthesia Plan: General   Post-op Pain Management:    Induction:   PONV Risk Score and Plan: Propofol infusion  Airway Management Planned:   Additional Equipment:   Intra-op Plan:   Post-operative Plan:   Informed Consent: I have reviewed the patients History and Physical, chart, labs and discussed the procedure including the risks, benefits and alternatives for the proposed anesthesia with the patient or authorized representative who has indicated his/her understanding and acceptance.     Dental Advisory Given  Plan Discussed with: CRNA  Anesthesia Plan Comments:         Anesthesia Quick Evaluation

## 2021-02-06 NOTE — Progress Notes (Signed)
Patients COVID test sent to lab

## 2021-02-06 NOTE — Progress Notes (Signed)
Patient called me in her room and wanted me to cut a scab out of her hair that was located on the back of her hair. When I looked at the scab it was about a quarter size in diameter. I informed the patient that I would not be able to cut that scab out of her hair and that the provider needed to look at this area

## 2021-02-06 NOTE — Interval H&P Note (Signed)
History and Physical Interval Note:  02/06/2021 10:51 AM  [name]  has presented today for surgery, with the diagnosis of Colitis.  The various methods of treatment have been discussed with the patient and family. After consideration of risks, benefits and other options for treatment, the patient has consented to  Procedure(s): FLEXIBLE SIGMOIDOSCOPY (N/A) as a surgical intervention.  The patient's history has been reviewed, patient examined, no change in status, stable for surgery.  I have reviewed the patient's chart and labs.  Questions were answered to the patient's satisfaction.     [name]

## 2021-02-06 NOTE — Progress Notes (Signed)
PROGRESS NOTE  [name] BDZ:[id number] DOB: [birth year] DOA: 02/03/2021 PCP: Teodoro Kil, PA-C  Brief History:  50 y.o. female, with history of COPD, questionable Crohn's, GERD, HTN, T2DM, xanax withdrawal seizure, tobacco abuse presenting with 1 week history of hematochezia and intermittent nausea and vomiting.  The patient was recent admitted to the hospital from 01/16/2021 to 01/24/2021 secondary to sepsis due to colitis.  The patient was seen by GI and it was felt that the patient could be treated with antibiotics.  She was discharged home with 5 additional days of Cipro and Flagyl.  Any procedures were felt to be able to be deferred into the outpatient setting.  The patient states that she was noncompliant with her antibiotics.  She only took 1 day of her antibiotics after discharge which caused her to have nausea and vomiting.  As result, she stopped taking the antibiotics.  During her last hospitalization, the patient was also treated for Xanax withdrawal seizures as well as a COVID-19 infection for which she was largely asymptomatic. She denies any fevers, chills, chest pain, shortness breath, headache, neck pain, coughing, hemoptysis.  She continues to smoke occasionally. In the emergency department, the patient was afebrile hemodynamically stable with oxygen saturation 100% room air.  WBC 8.8, hemoglobin 13.4, platelets 729K.  Sodium 136, potassium 3.7, BUN less than 5, creatinine 0.86.  CT of the abdomen and pelvis showed slightly improved but persistent bowel wall thickening and pericolonic stranding involving the descending and rectosigmoid colon.  She was started on ceftriaxone and metronidazole.  GI was consulted to assist.  She underwent flex sig on 02/06/21 which suggested IBD.  Antibiotics were discontinued and she was started on IV solumedrol.  Assessment/Plan: Hematochezia/colitis -Likely secondary to the patient's colitis -She was seen by GI during her last hospital  admission who felt this was more likely to be infectious rather than IBD. -review of medical records showed pt reports dx of Crohn's diagnosed 54 at [facility] but numerous studies including colonoscopy (05/24/2008) which showed normal terminal ileum and SBFT (07/26/2011) which showed normal SB have questioned if this was a true diagnosis -continue ceftriaxone and metronidazole -she is tolerating ceftriaxone without difficulty -Consult GI>>planning colonoscopy -C. difficile negative -Stool pathogen panel--neg -10/7 flex sig--Diffuse severe inflammation characterized by erosions, erythema and shallow ulcerations was found in the rectum and in the sigmoid colon extending up to approx 15 cm from the anal verge -10/7 discussed with GI, Dr. Carver--d/c abx, start IV solumedrol for presumptive IBD flare   Hypokalemia/hypomagnesemia -Repleted   Anxiety -Restart Xanax home dose -PDMP reviewed shows that the patient received Xanax 1 mg, #90 monthly   Pericardial effusion -01/22/2021 echo EF 60 to 65%, moderate pericardial effusion unchanged from 01/19/2021 -Patient is hemodynamically stable   COPD -Stable on room air   Tobacco abuse -Tobacco cessation discussed   THC abuse -cessation discussed  Personal History of PCN Allergy -patient has tolerated ceftriaxone without difficulty this admission             Status is: Inpatient   Remains inpatient appropriate because:Inpatient level of care appropriate due to severity of illness   Dispo: The patient is from: Home              Anticipated d/c is to: Home              Patient currently is not medically stable to d/c.  Difficult to place patient No               Family Communication:  significant other at bedside 10/7   Consultants:  GI   Code Status:  FULL    DVT Prophylaxis:  SCDs     Procedures: As Listed in Progress Note Above   Antibiotics: Ceftriaxone 10/5>>10/7 Metronidazole  10/4>>10/7          Subjective: Patient states abd pain is slightly better today.  Denies cp, sob, n/v, dysuria, hematuria.  She continues to have loose stool  Objective: Vitals:   02/06/21 1118 02/06/21 1120 02/06/21 1130 02/06/21 1502  BP: (!) 84/60 104/69 116/60 137/64  Pulse: 84 86 83 89  Resp: 17 12 (!) 9 20  Temp: 97.7 F (36.5 C)   98.2 F (36.8 C)  TempSrc:    Oral  SpO2: 96% 98% 100% 98%  Weight:      Height:        Intake/Output Summary (Last 24 hours) at 02/06/2021 1657 Last data filed at 02/06/2021 1622 Gross per 24 hour  Intake 357.5 ml  Output 0 ml  Net 357.5 ml   Weight change:  Exam:  General:  Pt is alert, follows commands appropriately, not in acute distress HEENT: No icterus, No thrush, No neck mass, [place]/AT Cardiovascular: RRR, S1/S2, no rubs, no gallops Respiratory: bibasilar crackles. No wheeze Abdomen: Soft/+BS, non tender, non distended, no guarding Extremities: No edema, No lymphangitis, No petechiae, No rashes, no synovitis   Data Reviewed: I have personally reviewed following labs and imaging studies Basic Metabolic Panel: Recent Labs  Lab 02/03/21 1206 02/03/21 1304 02/03/21 1635 02/04/21 0455 02/05/21 0513 02/06/21 0533  NA 133* 135  --  136 139 141  K 3.2* 3.2*  --  3.7 3.5 3.7  CL 96* 96*  --  104 106 108  CO2 27  --   --  27 27 27   GLUCOSE 118* 120*  --  111* 97 89  BUN 8 7  --  <5* 6 5*  CREATININE 0.86 0.70  --  0.77 0.90 0.77  CALCIUM 9.5  --   --  8.2* 8.3* 9.0  MG  --   --  1.6*  --  2.0 2.0   Liver Function Tests: Recent Labs  Lab 02/03/21 1206  AST 41  ALT 29  ALKPHOS 121  BILITOT 0.4  PROT 7.6  ALBUMIN 3.7   No results for input(s): LIPASE, AMYLASE in the last 168 hours. No results for input(s): AMMONIA in the last 168 hours. Coagulation Profile: No results for input(s): INR, PROTIME in the last 168 hours. CBC: Recent Labs  Lab 02/03/21 1206 02/03/21 1304 02/04/21 0455 02/05/21 0513  02/06/21 0533  WBC 8.8  --  4.3 5.0 4.1  HGB 13.4 14.3 10.7* 10.7* 11.4*  HCT 40.9 42.0 34.2* 33.6* 35.8*  MCV 92.3  --  95.5 96.3 96.5  PLT 729*  --  486* 507* 450*   Cardiac Enzymes: No results for input(s): CKTOTAL, CKMB, CKMBINDEX, TROPONINI in the last 168 hours. BNP: Invalid input(s): POCBNP CBG: Recent Labs  Lab 02/04/21 2110 02/05/21 0748 02/05/21 1209 02/05/21 1636 02/06/21 1042  GLUCAP 85 98 78 80 81   HbA1C: No results for input(s): HGBA1C in the last 72 hours. Urine analysis:    Component Value Date/Time   COLORURINE YELLOW 02/04/2021 [place] 02/04/2021 0956   LABSPEC 1.005 02/04/2021 0956   PHURINE 6.0 02/04/2021 0956   GLUCOSEU  NEGATIVE 02/04/2021 0956   GLUCOSEU NEG mg/dL 12/24/2009 2317   HGBUR MODERATE (A) 02/04/2021 0956   BILIRUBINUR NEGATIVE 02/04/2021 0956   KETONESUR NEGATIVE 02/04/2021 0956   PROTEINUR 30 (A) 02/04/2021 0956   UROBILINOGEN 0.2 12/26/2013 1530   NITRITE NEGATIVE 02/04/2021 0956   LEUKOCYTESUR MODERATE (A) 02/04/2021 0956   Sepsis Labs: @LABRCNTIP (procalcitonin:4,lacticidven:4) ) Recent Results (from the past 240 hour(s))  C Difficile Quick Screen w PCR reflex     Status: None   Collection Time: 02/03/21  9:49 PM   Specimen: STOOL  Result Value Ref Range Status   C Diff antigen NEGATIVE NEGATIVE Final   C Diff toxin NEGATIVE NEGATIVE Final   C Diff interpretation No C. difficile detected.  Final    Comment: Performed at [facility], [address] [place], [place] [address]  Gastrointestinal Panel by PCR , Stool     Status: None   Collection Time: 02/03/21  9:49 PM   Specimen: STOOL  Result Value Ref Range Status   Campylobacter species NOT DETECTED NOT DETECTED Final   Plesimonas shigelloides NOT DETECTED NOT DETECTED Final   Salmonella species NOT DETECTED NOT DETECTED Final   Yersinia enterocolitica NOT DETECTED NOT DETECTED Final   Vibrio species NOT DETECTED NOT DETECTED Final   Vibrio cholerae  NOT DETECTED NOT DETECTED Final   Enteroaggregative E coli (EAEC) NOT DETECTED NOT DETECTED Final   Enteropathogenic E coli (EPEC) NOT DETECTED NOT DETECTED Final   Enterotoxigenic E coli (ETEC) NOT DETECTED NOT DETECTED Final   Shiga like toxin producing E coli (STEC) NOT DETECTED NOT DETECTED Final   Shigella/Enteroinvasive E coli (EIEC) NOT DETECTED NOT DETECTED Final   Cryptosporidium NOT DETECTED NOT DETECTED Final   Cyclospora cayetanensis NOT DETECTED NOT DETECTED Final   Entamoeba histolytica NOT DETECTED NOT DETECTED Final   Giardia lamblia NOT DETECTED NOT DETECTED Final   Adenovirus F40/41 NOT DETECTED NOT DETECTED Final   Astrovirus NOT DETECTED NOT DETECTED Final   Norovirus GI/GII NOT DETECTED NOT DETECTED Final   Rotavirus A NOT DETECTED NOT DETECTED Final   Sapovirus (I, II, IV, and V) NOT DETECTED NOT DETECTED Final    Comment: Performed at [facility], [address] [place], [place] [address]  Urine Culture     Status: None   Collection Time: 02/04/21  9:56 AM   Specimen: Urine, Clean Catch  Result Value Ref Range Status   Specimen Description   Final    URINE, CLEAN CATCH Performed at [facility], [address], [place], [place] [address]    Special Requests   Final    NONE Performed at [facility], [address] [place], [place] [address]    Culture   Final    NO GROWTH Performed at [facility], [address] [place], [place] [address]    Report Status 02/05/2021 FINAL  Final  SARS Coronavirus 2 by RT PCR (hospital order, performed in [facility] hospital lab) Nasopharyngeal Nasopharyngeal Swab     Status: None   Collection Time: 02/06/21  6:31 AM   Specimen: Nasopharyngeal Swab  Result Value Ref Range Status   SARS Coronavirus 2 NEGATIVE NEGATIVE Final    Comment: (NOTE) SARS-CoV-2 target nucleic acids are NOT DETECTED.  The SARS-CoV-2 RNA is generally detectable in upper and lower respiratory specimens during the acute phase of  infection. The lowest concentration of SARS-CoV-2 viral copies this assay can detect is 250 copies / mL. A negative result does not preclude  SARS-CoV-2 infection and should not be used as the sole basis for treatment or other patient management decisions.  A negative result may occur with improper specimen collection / handling, submission of specimen other than nasopharyngeal swab, presence of viral mutation(s) within the areas targeted by this assay, and inadequate number of viral copies (<250 copies / mL). A negative result must be combined with clinical observations, patient history, and epidemiological information.  Fact Sheet for Patients:   StrictlyIdeas.no  Fact Sheet for Healthcare Providers: BankingDealers.co.za  This test is not yet approved or  cleared by the [place] FDA and has been authorized for detection and/or diagnosis of SARS-CoV-2 by FDA under an Emergency Use Authorization (EUA).  This EUA will remain in effect (meaning this test can be used) for the duration of the COVID-19 declaration under Section 564(b)(1) of the Act, 21 U.S.C. section 360bbb-3(b)(1), unless the authorization is terminated or revoked sooner.  Performed at [facility], [address], [place], [place] [address]      Scheduled Meds:  ALPRAZolam  1 mg Oral TID   hydrocortisone   Rectal BID   methylPREDNISolone (SOLU-MEDROL) injection  60 mg Intravenous Daily   pantoprazole  40 mg Oral Daily   Continuous Infusions:  levETIRAcetam 750 mg (02/06/21 1429)    Procedures/Studies: CT HEAD WO CONTRAST (5MM)  Result Date: 01/16/2021 CLINICAL DATA:  Status post seizure. EXAM: CT HEAD WITHOUT CONTRAST TECHNIQUE: Contiguous axial images were obtained from the base of the skull through the vertex without intravenous contrast. COMPARISON:  June 03, 2020 FINDINGS: Brain: No evidence of acute infarction, hemorrhage, hydrocephalus, extra-axial  collection or mass lesion/mass effect. Vascular: No hyperdense vessel or unexpected calcification. Skull: Normal. Negative for fracture or focal lesion. Sinuses/Orbits: A small right maxillary sinus air-fluid level is seen. Other: None. IMPRESSION: 1. No acute intracranial abnormality. Electronically Signed   By: Virgina Norfolk M.D.   On: 01/16/2021 01:08   CT ABDOMEN PELVIS W CONTRAST  Result Date: 02/03/2021 CLINICAL DATA:  Abdominal abscess/infection suspected. Rectal bleeding for 7 days. Denies pain. History of COPD, HTN, DM, hyster, GB, oophorectomy EXAM: CT ABDOMEN AND PELVIS WITH CONTRAST TECHNIQUE: Multidetector CT imaging of the abdomen and pelvis was performed using the standard protocol following bolus administration of intravenous contrast. CONTRAST:  151m OMNIPAQUE IOHEXOL 300 MG/ML  SOLN COMPARISON:  CT abdomen pelvis 01/21/2021 FINDINGS: Lower chest: Partially visualized slightly improved trace pericardial effusion. Resolution of bilateral pleural effusions. No acute abnormality. Hepatobiliary: Focally hypodensity of the hepatic parenchyma along the falciform ligament likely represents focal fatty infiltration. No focal liver abnormality. Status post cholecystectomy. No biliary dilatation. Pancreas: No focal lesion. Normal pancreatic contour. No surrounding inflammatory changes. No main pancreatic ductal dilatation. Spleen: Normal in size without focal abnormality. Adrenals/Urinary Tract: No adrenal nodule bilaterally. Bilateral kidneys enhance symmetrically. No hydronephrosis. No hydroureter. The urinary bladder is unremarkable. Resolution of gas associated with the urinary bladder. On delayed imaging, there is no urothelial wall thickening and there are no filling defects in the opacified portions of the bilateral collecting systems or ureters. Stomach/Bowel: Stomach is within normal limits. No evidence of small bowel wall thickening or dilatation. Slightly improved but persistent bowel wall  thickening, submucosal hyperemia, pericolonic fat stranding of the descending colon and rectosigmoid colon. Appendix appears normal. Vascular/Lymphatic: No abdominal aorta or iliac aneurysm. Mild noncalcified atherosclerotic plaque of the aorta and its branches. No abdominal, pelvic, or inguinal lymphadenopathy. Reproductive: Status post hysterectomy. No adnexal masses. Other: No intraperitoneal free fluid. No intraperitoneal free  gas. No organized fluid collection. Musculoskeletal: No abdominal wall hernia or abnormality. No suspicious lytic or blastic osseous lesions. No acute displaced fracture. Multilevel degenerative changes of the spine. IMPRESSION: 1. Slightly improved but persistent distal colitis. 2. Partially visualized slightly improved trace pericardial effusion. Resolution of bilateral pleural effusions. 3.  Aortic Atherosclerosis (ICD10-I70.0). Electronically Signed   By: Iven Finn M.D.   On: 02/03/2021 15:12   CT ABDOMEN PELVIS W CONTRAST  Result Date: 01/21/2021 CLINICAL DATA:  Abdominal pain, acute nonlocalized fever, leukocytosis. Abdominal abscess/infection suspected. COVID positive. History of COPD, collagen vascular disease and Crohn's disease. EXAM: CT ABDOMEN AND PELVIS WITH CONTRAST TECHNIQUE: Multidetector CT imaging of the abdomen and pelvis was performed using the standard protocol following bolus administration of intravenous contrast. CONTRAST:  26m OMNIPAQUE IOHEXOL 350 MG/ML SOLN COMPARISON:  CT 09/14/2015. FINDINGS: Lower chest: New small to moderate dependent pleural effusions with associated bibasilar atelectasis at both lung bases. There is a small pericardial effusion as well. The heart size is normal. No acute vascular findings are evident. Hepatobiliary: The liver is normal in density without suspicious focal abnormality. No significant biliary dilatation status post cholecystectomy. Pancreas: Unremarkable. No pancreatic ductal dilatation or surrounding inflammatory  changes. Spleen: Normal in size without focal abnormality. Adrenals/Urinary Tract: Both adrenal glands appear normal. Both kidneys appear normal. No evidence of urinary tract calculus, hydronephrosis or perinephric soft tissue stranding. The urinary bladder is moderately distended. There is a small amount of air within the bladder lumen. No bladder wall thickening or immediate surrounding inflammatory changes identified. Stomach/Bowel: Enteric contrast was administered and has passed into the mid small bowel. The stomach appears normal for its degree of distention. There is no significant small bowel distension, wall thickening or surrounding inflammation. By report, the patient is status post appendectomy. The proximal colon is fluid-filled and mildly distended. There is diffuse wall thickening of the colon extending from the splenic flexure to the rectum. Wall thickening and enhancement are most prominent within the proximal rectum. Distally, the rectal walls are less well-defined, and distal rectal perforation is difficult to exclude. There is moderate perirectal soft tissue stranding. These inflammatory changes do not appear to directly abut the posterior wall of the bladder. No discrete fistula are seen. Vascular/Lymphatic: There are no enlarged abdominal or pelvic lymph nodes. No acute vascular findings. The portal, superior mesenteric and splenic veins are patent. Reproductive: Hysterectomy. No adnexal mass. There is no gas within the vagina. Other: Small amount of pelvic ascites. As above, possible perirectal fluid without drainable fluid collection or fistula. No other extraluminal fluid collections are identified. There is no free air. Musculoskeletal: No acute or significant osseous findings. No evidence of discitis or sacroiliitis. IMPRESSION: 1. Findings are consistent with distal colitis, extending from the splenic flexure of the colon into the rectum. This colitis is presumably secondary to the  patient's Crohn's disease. 2. The walls of the distal rectum are ill-defined, and rectal perforation difficult to exclude. No drainable fluid collection identified. 3. Gas within the wall of the bladder without bladder wall thickening or immediate surrounding inflammatory changes. The gas could be iatrogenic, although if catheterization has not been recently performed, a colovesical fistula would be a consideration. 4. No evidence of small-bowel inflammation or obstruction. 5. Bilateral pleural effusions with small pericardial effusion and bibasilar atelectasis. 6. These results will be called to the ordering clinician or representative by the Radiologist Assistant, and communication documented in the PACS or [employer] Electronically Signed   By: WGwyndolyn Saxon  Lin Landsman M.D.   On: 01/21/2021 16:45   US RENAL  Result Date: 01/16/2021 CLINICAL DATA:  Acute kidney injury EXAM: RENAL / URINARY TRACT ULTRASOUND COMPLETE COMPARISON:  None. FINDINGS: Right Kidney: Renal measurements: 10.7 x 5.0 x 5.5 cm = volume: 156 mL. Echogenicity within normal limits. No mass or hydronephrosis visualized. Left Kidney: Renal measurements: 10.3 x 6.0 x 5.3 cm = volume: 172 mL. Echogenicity within normal limits. No mass or hydronephrosis visualized. Bladder: Appears normal for degree of bladder distention. Bilateral ureteral jets are not noted, and the patient is not able to void at the time of the examination. Other: None. IMPRESSION: 1. Unremarkable sonographic appearance of the kidneys. No hydronephrosis. 2. Bilateral ureteral jets are not noted, and the patient is not able to void at the time of the examination. Correlate for urinary retention. Electronically Signed   By: Eddie Candle M.D.   On: 01/16/2021 13:04   US Carotid Bilateral  Result Date: 01/16/2021 CLINICAL DATA:  Altered mental status, seizures EXAM: BILATERAL CAROTID DUPLEX ULTRASOUND TECHNIQUE: Pearline Cables scale imaging, color Doppler and duplex ultrasound were  performed of bilateral carotid and vertebral arteries in the neck. COMPARISON:  None. FINDINGS: Criteria: Quantification of carotid stenosis is based on velocity parameters that correlate the residual internal carotid diameter with NASCET-based stenosis levels, using the diameter of the distal internal carotid lumen as the denominator for stenosis measurement. The following velocity measurements were obtained: RIGHT ICA: 127/43 cm/sec CCA: 062/69 cm/sec SYSTOLIC ICA/CCA RATIO:  0.9 ECA:  118 cm/sec LEFT ICA: 145/41 cm/sec CCA: 485/46 cm/sec SYSTOLIC ICA/CCA RATIO:  1.1 ECA:  107 cm/sec RIGHT CAROTID ARTERY: No significant atherosclerotic plaque or evidence of stenosis in the internal carotid artery. RIGHT VERTEBRAL ARTERY:  Patent with normal antegrade flow. LEFT CAROTID ARTERY: Trace smooth heterogeneous atherosclerotic plaque in the proximal internal carotid artery. By peak systolic velocity criteria in the region of plaque, the estimated stenosis is less than 50%. LEFT VERTEBRAL ARTERY:  Patent with normal antegrade flow. IMPRESSION: 1. Mild (1-49%) stenosis proximal left internal carotid artery secondary to trace smooth heterogeneous atherosclerotic plaque. 2. No significant atherosclerotic plaque or evidence of stenosis in the right internal carotid artery. 3. Vertebral arteries are patent with normal antegrade flow. Signed, Criselda Peaches, MD, [facility] Electronically Signed   By: Jacqulynn Cadet M.D.   On: 01/16/2021 11:00   DG CHEST PORT 1 VIEW  Result Date: 01/17/2021 CLINICAL DATA:  Severe sepsis.  Patient is COVID-19 positive. EXAM: PORTABLE CHEST 1 VIEW COMPARISON:  January 16, 2021 FINDINGS: Heart, hila, mediastinum are normal. No pneumothorax. No nodules or masses. Minimal haziness in the left costophrenic angle, likely atelectasis. No suspicious infiltrates are identified. IMPRESSION: Minimal haziness in the left  costophrenic angle is favored to represent atelectasis. No other abnormalities are identified. No suspicious infiltrates. Electronically Signed   By: Dorise Bullion III M.D.   On: 01/17/2021 08:13   DG Chest Port 1 View  Result Date: 01/16/2021 CLINICAL DATA:  Seizure. EXAM: PORTABLE CHEST 1 VIEW COMPARISON:  May 09, 2016 FINDINGS: The heart size and mediastinal contours are within normal limits. Both lungs are clear. The visualized skeletal structures are unremarkable. IMPRESSION: No active disease. Electronically Signed   By: Virgina Norfolk M.D.   On: 01/16/2021 01:02   DG Foot 2 Views Right  Result Date: 01/19/2021 CLINICAL DATA:  Recent fall with bruising EXAM: RIGHT FOOT - 2 VIEW COMPARISON:  None. FINDINGS: There is no evidence  of fracture or dislocation. There is no evidence of arthropathy or other focal bone abnormality. Soft tissues are unremarkable. IMPRESSION: Negative. Electronically Signed   By: Jorje Guild M.D.   On: 01/19/2021 11:14   EEG adult  Result Date: 01/16/2021 Lora Havens, MD     01/16/2021 10:16 PM Patient Name: [facility] MRN: [id number] Epilepsy Attending: Lora Havens Referring Physician/Provider: Dr Clearence Ped, [place] B Date: 01/16/2021 Duration: 22.32 mins Patient history: 50 yo F with AMS and metabolic changes, Likely due to xanax withdrawal seizures. EEG to evaluate for seizure Level of alertness: Awake AEDs during EEG study: LEV, GBP Technical aspects: This EEG study was done with scalp electrodes positioned according to the 10-20 International system of electrode placement. Electrical activity was acquired at a sampling rate of 500Hz  and reviewed with a high frequency filter of 70Hz  and a low frequency filter of 1Hz . EEG data were recorded continuously and digitally stored. Description: The posterior dominant rhythm consists of 9 Hz activity of moderate voltage (25-35 uV) seen predominantly in posterior head regions, symmetric and reactive to eye  opening and eye closing. EEG showed intermittent generalized 5 to 6 Hz theta slowing. Hyperventilation and photic stimulation were not performed.   ABNORMALITY - Intermittent slow, generalized IMPRESSION: This study is suggestive of mild diffuse encephalopathy, nonspecific etiology. No seizures or epileptiform discharges were seen throughout the recording. Lora Havens   ECHOCARDIOGRAM COMPLETE  Result Date: 01/16/2021    ECHOCARDIOGRAM REPORT   Patient Name:   BAUDELIA SCHROEPFER Date of Exam: 01/16/2021 Medical Rec #:  [id number]     Height:       66.0 in Accession #:    [id number]    Weight:       145.0 lb Date of Birth:  [birth year]     BSA:          1.744 m Patient Age:    66 years      BP:           81/20 mmHg Patient Gender: F             HR:           111 bpm. Exam Location:  [name] Procedure: 2D Echo, Cardiac Doppler and Color Doppler Indications:    Syncope  History:        Patient has prior history of Echocardiogram examinations, most                 recent 07/11/2010. COPD, Signs/Symptoms:Dyspnea; Risk                 Factors:Tobacco abuse and Diabetes. COVID +.  Sonographer:    Wenda Low Referring Phys: ASIA B [place]  1. Left ventricular ejection fraction, by estimation, is 70 to 75%. The left ventricle has hyperdynamic function. The left ventricle has no regional wall motion abnormalities. There is mild left ventricular hypertrophy. Left ventricular diastolic parameters are indeterminate.  2. Right ventricular systolic function is normal. The right ventricular size is normal. Tricuspid regurgitation signal is inadequate for assessing PA pressure.  3. No right atrial or right ventricular collapse to suggest tamponade physiology. Respiratory variation in mitral outflow not assessed. Presence of sinus tachycardia does raise concern however for potential hemodynamic significance. Follow-up clinically  and suggest repeat limited echocardiogram within tht next 48 hours. Moderate  pericardial effusion. The pericardial effusion is circumferential.  4. The mitral valve is grossly normal. Mild mitral valve regurgitation.  5. The aortic valve is  tricuspid. Aortic valve regurgitation is not visualized. No aortic stenosis is present. Aortic valve mean gradient measures 6.0 mmHg.  6. The inferior vena cava is normal in size with <50% respiratory variability, suggesting right atrial pressure of 8 mmHg. Comparison(s): No prior Echocardiogram. FINDINGS  Left Ventricle: Left ventricular ejection fraction, by estimation, is 70 to 75%. The left ventricle has hyperdynamic function. The left ventricle has no regional wall motion abnormalities. The left ventricular internal cavity size was normal in size. There is mild left ventricular hypertrophy. Left ventricular diastolic parameters are indeterminate. Right Ventricle: The right ventricular size is normal. No increase in right ventricular wall thickness. Right ventricular systolic function is normal. Tricuspid regurgitation signal is inadequate for assessing PA pressure. Left Atrium: Left atrial size was normal in size. Right Atrium: Right atrial size was normal in size. Pericardium: No right atrial or right ventricular collapse to suggest tamponade physiology. Respiratory variation in mitral outflow not assessed. Presence of sinus tachycardia does raise concern however for potential hemodynamic significance. Follow-up clinically and suggest repeat limited echocardiogram within tht next 48 hours. A moderately sized pericardial effusion is present. The pericardial effusion is circumferential. Mitral Valve: The mitral valve is grossly normal. Mild mitral valve regurgitation. MV peak gradient, 13.1 mmHg. The mean mitral valve gradient is 6.0 mmHg. Tricuspid Valve: The tricuspid valve is grossly normal. Tricuspid valve regurgitation is trivial. Aortic Valve: The aortic valve is tricuspid. Aortic valve regurgitation is not visualized. No aortic stenosis is  present. Aortic valve mean gradient measures 6.0 mmHg. Aortic valve peak gradient measures 10.8 mmHg. Aortic valve area, by VTI measures 2.42  cm. Pulmonic Valve: The pulmonic valve was grossly normal. Pulmonic valve regurgitation is trivial. Aorta: The aortic root is normal in size and structure. Venous: The inferior vena cava is normal in size with less than 50% respiratory variability, suggesting right atrial pressure of 8 mmHg. IAS/Shunts: No atrial level shunt detected by color flow Doppler.  LEFT VENTRICLE PLAX 2D LVIDd:         3.70 cm  Diastology LVIDs:         2.40 cm  LV e' medial:    9.17 cm/s LV PW:         1.10 cm  LV E/e' medial:  11.2 LV IVS:        1.10 cm  LV e' lateral:   9.17 cm/s LVOT diam:     2.00 cm  LV E/e' lateral: 11.2 LV SV:         58 LV SV Index:   33 LVOT Area:     3.14 cm  RIGHT VENTRICLE RV Basal diam:  2.30 cm RV Mid diam:    2.00 cm RV S prime:     27.70 cm/s TAPSE (M-mode): 3.3 cm LEFT ATRIUM             Index       RIGHT ATRIUM           Index LA diam:        3.30 cm 1.89 cm/m  RA Area:     10.80 cm LA Vol (A2C):   41.8 ml 23.96 ml/m RA Volume:   17.70 ml  10.15 ml/m LA Vol (A4C):   34.7 ml 19.89 ml/m LA Biplane Vol: 38.5 ml 22.07 ml/m  AORTIC VALVE AV Area (Vmax):    2.47 cm AV Area (Vmean):   2.20 cm AV Area (VTI):     2.42 cm AV Vmax:  164.00 cm/s AV Vmean:          109.000 cm/s AV VTI:            0.240 m AV Peak Grad:      10.8 mmHg AV Mean Grad:      6.0 mmHg LVOT Vmax:         129.00 cm/s LVOT Vmean:        76.400 cm/s LVOT VTI:          0.185 m LVOT/AV VTI ratio: 0.77  AORTA Ao Root diam: 3.20 cm MITRAL VALVE MV Area (PHT): 5.02 cm     SHUNTS MV Area VTI:   1.73 cm     Systemic VTI:  0.18 m MV Peak grad:  13.1 mmHg    Systemic Diam: 2.00 cm MV Mean grad:  6.0 mmHg MV Vmax:       1.81 m/s MV Vmean:      109.0 cm/s MV Decel Time: 151 msec MV E velocity: 103.00 cm/s MV A velocity: 168.00 cm/s MV E/A ratio:  0.61 Rozann Lesches MD Electronically signed by  Rozann Lesches MD Signature Date/Time: 01/16/2021/4:08:15 PM    Final    ECHOCARDIOGRAM LIMITED  Result Date: 01/22/2021    ECHOCARDIOGRAM LIMITED REPORT   Patient Name:   MAKYLEE SANBORN Date of Exam: 01/22/2021 Medical Rec #:  [id number]     Height:       66.0 in Accession #:    [id number]    Weight:       161.6 lb Date of Birth:  1970/08/05     BSA:          1.827 m Patient Age:    50 years      BP:           107/64 mmHg Patient Gender: F             HR:           88 bpm. Exam Location:  [name] Procedure: Limited Echo Indications:    Pericardial effusion  History:        Patient has prior history of Echocardiogram examinations, most                 recent 01/19/2021. COPD, Arrythmias:PVC, Signs/Symptoms:Chest                 Pain; Risk Factors:Diabetes. COVID +, Tobacco abuse, Sepsis.  Sonographer:    Wenda Low Referring Phys: [phone] Cyra Spader IMPRESSIONS  1. Left ventricular ejection fraction, by estimation, is 60 to 65%. The left ventricle has normal function.  2. Moderate pericardial effusion. The pericardial effusion is circumferential. There is no evidence of cardiac tamponade. Effusion is stable compared to the 01/19/21 study  3. The inferior vena cava is normal in size with greater than 50% respiratory variability, suggesting right atrial pressure of 3 mmHg.  4. Limited echo to evaluate pericardial effusion FINDINGS  Left Ventricle: Left ventricular ejection fraction, by estimation, is 60 to 65%. The left ventricle has normal function. Pericardium: A moderately sized pericardial effusion is present. The pericardial effusion is circumferential. There is no evidence of cardiac tamponade. Venous: The inferior vena cava is normal in size with greater than 50% respiratory variability, suggesting right atrial pressure of 3 mmHg. LEFT VENTRICLE PLAX 2D LVIDd:         3.20 cm LVIDs:         2.40 cm LV PW:         1.20  cm LV IVS:        0.90 cm LVOT diam:     2.10 cm LVOT Area:     3.46 cm  LEFT ATRIUM          Index LA diam:    3.20 cm 1.75 cm/m   AORTA Ao Root diam: 3.40 cm  SHUNTS Systemic Diam: 2.10 cm Carlyle Dolly MD Electronically signed by Carlyle Dolly MD Signature Date/Time: 01/22/2021/11:50:46 AM    Final    ECHOCARDIOGRAM LIMITED  Result Date: 01/19/2021    ECHOCARDIOGRAM LIMITED REPORT   Patient Name:   [facility] Date of Exam: 01/19/2021 Medical Rec #:  [id number]     Height:       66.0 in Accession #:    [id number]    Weight:       161.6 lb Date of Birth:  1971/04/13     BSA:          1.827 m Patient Age:    84 years      BP:           99/68 mmHg Patient Gender: F             HR:           96 bpm. Exam Location:  [name] Procedure: Limited Echo Indications:    Pericardial effusion  History:        Patient has prior history of Echocardiogram examinations, most                 recent 01/16/2021. COPD, Arrythmias:PVC, Signs/Symptoms:Chest                 Pain; Risk Factors:Diabetes. COVID +, Tobacco Abuse, Sepsis,                 Pericardial effusion.  Sonographer:    Wenda Low Referring Phys: [place]  1. Left ventricular ejection fraction, by estimation, is 60 to 65%. The left ventricle has normal function.  2. Moderate pericardial effusion. The pericardial effusion is circumferential. There is no evidence of cardiac tamponade. Effusion looks larger compared to the 01/16/2021 study however remains overall moderate by measurements. Recommend repeating limited  study in 72 hours. FINDINGS  Left Ventricle: Left ventricular ejection fraction, by estimation, is 60 to 65%. The left ventricle has normal function. Pericardium: A moderately sized pericardial effusion is present. The pericardial effusion is circumferential. There is no evidence of cardiac tamponade. LEFT VENTRICLE PLAX 2D LVIDd:         3.90 cm LVIDs:         2.40 cm LV PW:         1.00 cm LV IVS:        1.00 cm LVOT diam:     2.00 cm LVOT Area:     3.14 cm  LEFT ATRIUM         Index LA diam:    3.40 cm  1.86 cm/m   SHUNTS Systemic Diam: 2.00 cm Carlyle Dolly MD Electronically signed by Carlyle Dolly MD Signature Date/Time: 01/19/2021/11:20:45 AM    Final    [place] EKG SITE RITE  Result Date: 01/16/2021 If Site Rite image not attached, placement could not be confirmed due to current cardiac rhythm.   Orson Eva, DO  [facility]  If 7PM-7AM, please contact night-coverage [email or web address] Password [email or web address] 02/06/2021, 4:57 PM   LOS: 3 days

## 2021-02-06 NOTE — Anesthesia Postprocedure Evaluation (Signed)
Anesthesia Post Note  Patient: [name]  Procedure(s) Performed: FLEXIBLE SIGMOIDOSCOPY BIOPSY  Patient location during evaluation: Phase II Anesthesia Type: General Level of consciousness: awake Pain management: pain level controlled Vital Signs Assessment: post-procedure vital signs reviewed and stable Respiratory status: spontaneous breathing and respiratory function stable Cardiovascular status: blood pressure returned to baseline and stable Postop Assessment: no headache and no apparent nausea or vomiting Anesthetic complications: no Comments: Late entry   No notable events documented.   Last Vitals:  Vitals:   02/06/21 1120 02/06/21 1130  BP: 104/69 116/60  Pulse: 86 83  Resp: 12 (!) 9  Temp:    SpO2: 98% 100%    Last Pain:  Vitals:   02/06/21 1118  TempSrc:   PainSc: 0-No pain                 Louann Sjogren

## 2021-02-06 NOTE — Op Note (Addendum)
[facility] Patient Name: [name] Procedure Date: 02/06/2021 10:46 AM MRN: [id number] Date of Birth: Mar 07, 1971 Attending MD: Elon Alas. Abbey Chatters DO CSN: [id number] Age: 50 Admit Type: Inpatient Procedure:                Flexible Sigmoidoscopy Indications:              Hematochezia, Abnormal CT of the GI tract Providers:                Elon Alas. Abbey Chatters, DO, Crystal Page, Randa Spike, Technician Referring MD:              Medicines:                See the Anesthesia note for documentation of the                            administered medications Complications:            No immediate complications. Estimated Blood Loss:     Estimated blood loss was minimal. Estimated blood                            loss was minimal. Procedure:                Pre-Anesthesia Assessment:                           - The anesthesia plan was to use monitored                            anesthesia care (MAC).                           After obtaining informed consent, the scope was                            passed under direct vision. The PCF-HQ190L                            ([id number]) scope was introduced through the anus and                            advanced to the the sigmoid colon. The flexible                            sigmoidoscopy was accomplished without difficulty.                            The patient tolerated the procedure well. The                            quality of the bowel preparation was good. Scope In: 11:07:07 AM Scope Out: 11:11:51 AM Total Procedure Duration: 0 hours 4 minutes 44 seconds  Findings:      The perianal and digital rectal examinations were normal.  Diffuse severe inflammation characterized by erosions, erythema and       shallow ulcerations was found in the rectum and in the sigmoid colon       extending up to approx 15 cm from the anal verge at which point a tight       stricture was encountered. Unable to pass  pediatric colonoscope through       this. Tissue very friable, spontaneous bleeding occured with just       passing the scope. Biopsies were taken with a cold forceps for histology. Impression:               - Diffuse severe inflammation was found in the                            rectum and in the sigmoid colon secondary to                            proctosigmoid colitis. Biopsied. Moderate Sedation:      Per Anesthesia Care Recommendation:           - Return patient to hospital ward for ongoing care.                           - Advance diet as tolerated.                           - Patient with severe colitis and sigmoid                            stricture. Query underlying inflammatory bowel                            disease. GI pathogen and C diff testing negative.                            Will preemptively treat with IV steroids while we                            await biopsies. Will need repeat Flexible                            sigmoidoscopy in 2-3 weeks. May require surgical                            resection of sigmoid stricture pending clinical                            course. Procedure Code(s):        --- Professional ---                           [id number], Sigmoidoscopy, flexible; with biopsy, single                            or multiple Diagnosis Code(s):        --- Professional ---  K63.89, Other specified diseases of intestine                           K92.1, Melena (includes Hematochezia)                           R93.3, Abnormal findings on diagnostic imaging of                            other parts of digestive tract CPT copyright 2019 [employer]. All rights reserved. The codes documented in this report are preliminary and upon coder review may  be revised to meet current compliance requirements. Elon Alas. Abbey Chatters, DO [place] Abbey Chatters, DO 02/06/2021 11:25:57 AM This report has been signed electronically. Number of  Addenda: 0

## 2021-02-06 NOTE — Transfer of Care (Signed)
Immediate Anesthesia Transfer of Care Note  Patient: [name]  Procedure(s) Performed: FLEXIBLE SIGMOIDOSCOPY BIOPSY  Patient Location: PACU  Anesthesia Type:General  Level of Consciousness: drowsy  Airway & Oxygen Therapy: Patient Spontanous Breathing  Post-op Assessment: Report given to RN and Post -op Vital signs reviewed and stable  Post vital signs: Reviewed and stable  Last Vitals:  Vitals Value Taken Time  BP    Temp    Pulse 80   Resp 20   SpO2 95%     Last Pain:  Vitals:   02/06/21 1104  TempSrc:   PainSc: 0-No pain         Complications: No notable events documented.

## 2021-02-07 DIAGNOSIS — K56699 Other intestinal obstruction unspecified as to partial versus complete obstruction: Secondary | ICD-10-CM

## 2021-02-07 DIAGNOSIS — K529 Noninfective gastroenteritis and colitis, unspecified: Secondary | ICD-10-CM | POA: Diagnosis not present

## 2021-02-07 DIAGNOSIS — K921 Melena: Secondary | ICD-10-CM | POA: Diagnosis not present

## 2021-02-07 DIAGNOSIS — F17200 Nicotine dependence, unspecified, uncomplicated: Secondary | ICD-10-CM | POA: Diagnosis not present

## 2021-02-07 LAB — VITAMIN B1: Vitamin B1 (Thiamine): 153.4 nmol/L (ref 66.5–200.0)

## 2021-02-07 NOTE — Progress Notes (Signed)
Subjective: Patient states she is doing better today.  Tolerating diet.  Received Solu-Medrol yesterday and this morning.  Abdominal pain improved.  Also notes her rectal bleeding has decreased.  Objective: Vital signs in last 24 hours: Temp:  [97.6 F (36.4 C)-98.2 F (36.8 C)] 98.1 F (36.7 C) (10/08 0539) Pulse Rate:  [83-89] 86 (10/08 0539) Resp:  [9-20] 16 (10/08 0539) BP: (84-138)/(49-71) 127/71 (10/08 0539) SpO2:  [96 %-100 %] 97 % (10/08 0539) Last BM Date: 02/06/21 General:   Alert and oriented, pleasant Head:  Normocephalic and atraumatic. Eyes:  No icterus, sclera clear. Conjuctiva pink.  Abdomen:  Bowel sounds present, soft, non-tender, non-distended. No HSM or hernias noted. No rebound or guarding. No masses appreciated  Msk:  Symmetrical without gross deformities. Normal posture. Extremities:  Without clubbing or edema. Neurologic:  Alert and  oriented x4;  grossly normal neurologically. Skin:  Warm and dry, intact without significant lesions.  Cervical Nodes:  No significant cervical adenopathy. Psych:  Alert and cooperative. Normal mood and affect.  Intake/Output from previous day: 10/07 0701 - 10/08 0700 In: 872 [P.O.:480; I.V.:150; IV Piggyback:242] Out: 0  Intake/Output this shift: No intake/output data recorded.  Lab Results: Recent Labs    02/05/21 0513 02/06/21 0533  WBC 5.0 4.1  HGB 10.7* 11.4*  HCT 33.6* 35.8*  PLT 507* 450*   BMET Recent Labs    02/05/21 0513 02/06/21 0533  NA 139 141  K 3.5 3.7  CL 106 108  CO2 27 27  GLUCOSE 97 89  BUN 6 5*  CREATININE 0.90 0.77  CALCIUM 8.3* 9.0   LFT No results for input(s): PROT, ALBUMIN, AST, ALT, ALKPHOS, BILITOT, BILIDIR, IBILI in the last 72 hours. PT/INR No results for input(s): LABPROT, INR in the last 72 hours. Hepatitis Panel No results for input(s): HEPBSAG, HCVAB, HEPAIGM, HEPBIGM in the last 72 hours.   Studies/Results: No results found.  Assessment: *Left-sided  colitis *Sigmoid stricture *Rectal bleeding-improved *Left lower quadrant abdominal pain  Plan: Flexible sigmoidoscopy 02/06/2021 showed moderate to severe colitis extending from her rectum up to 15 cm at which point a tight sigmoid stricture was encountered, unable to pass pediatric colonoscope. Biopsies taken  GI pathogen panel negative, C. difficile testing negative.  Antibiotics have been discontinued.  Given her reported history of Crohn's disease in the past and negative stool testing for infectious causes, think it is reasonable to consider this IBD related.  Would continue on IV Solu-Medrol while inpatient.  She will need extended course of oral prednisone upon discharge.  We should have her biopsies results by Monday.  She will need repeat flexible sigmoidoscopy in 3 to 4 weeks.  She may need surgical resection of sigmoid stricture if not improved.  GI to continue to follow.  Elon Alas. Abbey Chatters, D.O. Gastroenterology and Hepatology [facility]   LOS: 4 days    02/07/2021, 9:53 AM

## 2021-02-07 NOTE — Progress Notes (Signed)
PROGRESS NOTE  [name] ZMO:[id number] DOB: [birth year] DOA: 02/03/2021 PCP: Teodoro Kil, PA-C   50 y.o. female, with history of COPD, questionable Crohn's, GERD, HTN, T2DM, xanax withdrawal seizure, tobacco abuse presenting with 1 week history of hematochezia and intermittent nausea and vomiting.  The patient was recent admitted to the hospital from 01/16/2021 to 01/24/2021 secondary to sepsis due to colitis.  The patient was seen by GI and it was felt that the patient could be treated with antibiotics.  She was discharged home with 5 additional days of Cipro and Flagyl.  Any procedures were felt to be able to be deferred into the outpatient setting.  The patient states that she was noncompliant with her antibiotics.  She only took 1 day of her antibiotics after discharge which caused her to have nausea and vomiting.  As result, she stopped taking the antibiotics.  During her last hospitalization, the patient was also treated for Xanax withdrawal seizures as well as a COVID-19 infection for which she was largely asymptomatic. She denies any fevers, chills, chest pain, shortness breath, headache, neck pain, coughing, hemoptysis.  She continues to smoke occasionally. In the emergency department, the patient was afebrile hemodynamically stable with oxygen saturation 100% room air.  WBC 8.8, hemoglobin 13.4, platelets 729K.  Sodium 136, potassium 3.7, BUN less than 5, creatinine 0.86.  CT of the abdomen and pelvis showed slightly improved but persistent bowel wall thickening and pericolonic stranding involving the descending and rectosigmoid colon.  She was started on ceftriaxone and metronidazole.  GI was consulted to assist.  She underwent flex sig on 02/06/21 which suggested IBD.  Antibiotics were discontinued and she was started on IV solumedrol.   Assessment/Plan: Hematochezia/colitis -Likely secondary to the patient's colitis -She was seen by GI during her last hospital admission who  felt this was more likely to be infectious rather than IBD. -initially started ceftriaxone and metronidazole -she was tolerating ceftriaxone without difficulty -Consult GI>>planning colonoscopy -C. difficile negative -Stool pathogen panel--neg -10/7 flex sig--Diffuse severe inflammation characterized by erosions, erythema and shallow ulcerations was found in the rectum and in the sigmoid colon extending up to approx 15 cm from the anal verge -10/7 discussed with GI, Dr. Carver--d/c abx, start IV solumedrol for presumptive IBD flare -continue IV steroids   Hypokalemia/hypomagnesemia -Repleted   Anxiety -Restart Xanax home dose -PDMP reviewed shows that the patient received Xanax 1 mg, #90 monthly   Pericardial effusion -01/22/2021 echo EF 60 to 65%, moderate pericardial effusion unchanged from 01/19/2021 -Patient is hemodynamically stable   COPD -Stable on room air   Tobacco abuse -Tobacco cessation discussed   THC abuse -cessation discussed   Personal History of PCN Allergy -patient has tolerated ceftriaxone without difficulty this admission             Status is: Inpatient   Remains inpatient appropriate because:Inpatient level of care appropriate due to severity of illness   Dispo: The patient is from: Home              Anticipated d/c is to: Home              Patient currently is not medically stable to d/c.              Difficult to place patient No               Family Communication:  significant other at bedside 10/7   Consultants:  GI  Code Status:  FULL    DVT Prophylaxis:  SCDs     Procedures: As Listed in Progress Note Above   Antibiotics: Ceftriaxone 10/5>>10/7 Metronidazole 10/4>>10/7     Subjective: Patient states that abd pain is improving.  Denies f/c cp, sob, n/v/.  Less blood per rectum  Objective: Vitals:   02/06/21 1130 02/06/21 1502 02/06/21 2123 02/07/21 0539  BP: 116/60 137/64 104/62 127/71  Pulse: 83 89 86 86  Resp:  (!) 9 20 20 16   Temp:  98.2 F (36.8 C) 97.6 F (36.4 C) 98.1 F (36.7 C)  TempSrc:  Oral Oral Oral  SpO2: 100% 98% 98% 97%  Weight:      Height:        Intake/Output Summary (Last 24 hours) at 02/07/2021 1321 Last data filed at 02/07/2021 0800 Gross per 24 hour  Intake 961.99 ml  Output --  Net 961.99 ml   Weight change:  Exam:  General:  Pt is alert, follows commands appropriately, not in acute distress HEENT: No icterus, No thrush, No neck mass, [place]/AT Cardiovascular: RRR, S1/S2, no rubs, no gallops Respiratory: CTA bilaterally, no wheezing, no crackles, no rhonchi Abdomen: Soft/+BS, non tender, non distended, no guarding Extremities: No edema, No lymphangitis, No petechiae, No rashes, no synovitis   Data Reviewed: I have personally reviewed following labs and imaging studies Basic Metabolic Panel: Recent Labs  Lab 02/03/21 1206 02/03/21 1304 02/03/21 1635 02/04/21 0455 02/05/21 0513 02/06/21 0533  NA 133* 135  --  136 139 141  K 3.2* 3.2*  --  3.7 3.5 3.7  CL 96* 96*  --  104 106 108  CO2 27  --   --  27 27 27   GLUCOSE 118* 120*  --  111* 97 89  BUN 8 7  --  <5* 6 5*  CREATININE 0.86 0.70  --  0.77 0.90 0.77  CALCIUM 9.5  --   --  8.2* 8.3* 9.0  MG  --   --  1.6*  --  2.0 2.0   Liver Function Tests: Recent Labs  Lab 02/03/21 1206  AST 41  ALT 29  ALKPHOS 121  BILITOT 0.4  PROT 7.6  ALBUMIN 3.7   No results for input(s): LIPASE, AMYLASE in the last 168 hours. No results for input(s): AMMONIA in the last 168 hours. Coagulation Profile: No results for input(s): INR, PROTIME in the last 168 hours. CBC: Recent Labs  Lab 02/03/21 1206 02/03/21 1304 02/04/21 0455 02/05/21 0513 02/06/21 0533  WBC 8.8  --  4.3 5.0 4.1  HGB 13.4 14.3 10.7* 10.7* 11.4*  HCT 40.9 42.0 34.2* 33.6* 35.8*  MCV 92.3  --  95.5 96.3 96.5  PLT 729*  --  486* 507* 450*   Cardiac Enzymes: No results for input(s): CKTOTAL, CKMB, CKMBINDEX, TROPONINI in the last 168  hours. BNP: Invalid input(s): POCBNP CBG: Recent Labs  Lab 02/05/21 0748 02/05/21 1209 02/05/21 1636 02/06/21 1042 02/06/21 2121  GLUCAP 98 78 80 81 105*   HbA1C: No results for input(s): HGBA1C in the last 72 hours. Urine analysis:    Component Value Date/Time   COLORURINE YELLOW 02/04/2021 0956   APPEARANCEUR CLEAR 02/04/2021 0956   LABSPEC 1.005 02/04/2021 0956   PHURINE 6.0 02/04/2021 0956   GLUCOSEU NEGATIVE 02/04/2021 0956   GLUCOSEU NEG mg/dL 12/24/2009 2317   HGBUR MODERATE (A) 02/04/2021 0956   BILIRUBINUR NEGATIVE 02/04/2021 [place] 02/04/2021 0956   PROTEINUR 30 (A) 02/04/2021 7209  UROBILINOGEN 0.2 12/26/2013 1530   NITRITE NEGATIVE 02/04/2021 0956   LEUKOCYTESUR MODERATE (A) 02/04/2021 0956   Sepsis Labs: @LABRCNTIP (procalcitonin:4,lacticidven:4) ) Recent Results (from the past 240 hour(s))  C Difficile Quick Screen w PCR reflex     Status: None   Collection Time: 02/03/21  9:49 PM   Specimen: STOOL  Result Value Ref Range Status   C Diff antigen NEGATIVE NEGATIVE Final   C Diff toxin NEGATIVE NEGATIVE Final   C Diff interpretation No C. difficile detected.  Final    Comment: Performed at [facility], [address], [place], [place] [address]  Gastrointestinal Panel by PCR , Stool     Status: None   Collection Time: 02/03/21  9:49 PM   Specimen: STOOL  Result Value Ref Range Status   Campylobacter species NOT DETECTED NOT DETECTED Final   Plesimonas shigelloides NOT DETECTED NOT DETECTED Final   Salmonella species NOT DETECTED NOT DETECTED Final   Yersinia enterocolitica NOT DETECTED NOT DETECTED Final   Vibrio species NOT DETECTED NOT DETECTED Final   Vibrio cholerae NOT DETECTED NOT DETECTED Final   Enteroaggregative E coli (EAEC) NOT DETECTED NOT DETECTED Final   Enteropathogenic E coli (EPEC) NOT DETECTED NOT DETECTED Final   Enterotoxigenic E coli (ETEC) NOT DETECTED NOT DETECTED Final   Shiga like toxin producing E coli  (STEC) NOT DETECTED NOT DETECTED Final   Shigella/Enteroinvasive E coli (EIEC) NOT DETECTED NOT DETECTED Final   Cryptosporidium NOT DETECTED NOT DETECTED Final   Cyclospora cayetanensis NOT DETECTED NOT DETECTED Final   Entamoeba histolytica NOT DETECTED NOT DETECTED Final   Giardia lamblia NOT DETECTED NOT DETECTED Final   Adenovirus F40/41 NOT DETECTED NOT DETECTED Final   Astrovirus NOT DETECTED NOT DETECTED Final   Norovirus GI/GII NOT DETECTED NOT DETECTED Final   Rotavirus A NOT DETECTED NOT DETECTED Final   Sapovirus (I, II, IV, and V) NOT DETECTED NOT DETECTED Final    Comment: Performed at [facility], [address] [place], [place] [address]  Urine Culture     Status: None   Collection Time: 02/04/21  9:56 AM   Specimen: Urine, Clean Catch  Result Value Ref Range Status   Specimen Description   Final    URINE, CLEAN CATCH Performed at [facility], [address], [place], [place] [address]    Special Requests   Final    NONE Performed at [facility], [address], [place], [place] [address]    Culture   Final    NO GROWTH Performed at [facility], [address] [place], [place] [address]    Report Status 02/05/2021 FINAL  Final  SARS Coronavirus 2 by RT PCR (hospital order, performed in [facility] hospital lab) Nasopharyngeal Nasopharyngeal Swab     Status: None   Collection Time: 02/06/21  6:31 AM   Specimen: Nasopharyngeal Swab  Result Value Ref Range Status   SARS Coronavirus 2 NEGATIVE NEGATIVE Final    Comment: (NOTE) SARS-CoV-2 target nucleic acids are NOT DETECTED.  The SARS-CoV-2 RNA is generally detectable in upper and lower respiratory specimens during the acute phase of infection. The lowest concentration of SARS-CoV-2 viral copies this assay can detect is 250 copies / mL. A negative result does not preclude SARS-CoV-2 infection and should not be used as the sole basis for treatment or other patient management decisions.  A  negative result may occur with improper specimen collection / handling, submission of specimen other than nasopharyngeal swab, presence  of viral mutation(s) within the areas targeted by this assay, and inadequate number of viral copies (<250 copies / mL). A negative result must be combined with clinical observations, patient history, and epidemiological information.  Fact Sheet for Patients:   StrictlyIdeas.no  Fact Sheet for Healthcare Providers: BankingDealers.co.za  This test is not yet approved or  cleared by the [place] FDA and has been authorized for detection and/or diagnosis of SARS-CoV-2 by FDA under an Emergency Use Authorization (EUA).  This EUA will remain in effect (meaning this test can be used) for the duration of the COVID-19 declaration under Section 564(b)(1) of the Act, 21 U.S.C. section 360bbb-3(b)(1), unless the authorization is terminated or revoked sooner.  Performed at [facility], [address], [place], [place] [address]      Scheduled Meds:  ALPRAZolam  1 mg Oral TID   hydrocortisone   Rectal BID   methylPREDNISolone (SOLU-MEDROL) injection  60 mg Intravenous Daily   pantoprazole  40 mg Oral Daily   Continuous Infusions:  levETIRAcetam 750 mg (02/07/21 0841)    Procedures/Studies: CT HEAD WO CONTRAST (5MM)  Result Date: 01/16/2021 CLINICAL DATA:  Status post seizure. EXAM: CT HEAD WITHOUT CONTRAST TECHNIQUE: Contiguous axial images were obtained from the base of the skull through the vertex without intravenous contrast. COMPARISON:  June 03, 2020 FINDINGS: Brain: No evidence of acute infarction, hemorrhage, hydrocephalus, extra-axial collection or mass lesion/mass effect. Vascular: No hyperdense vessel or unexpected calcification. Skull: Normal. Negative for fracture or focal lesion. Sinuses/Orbits: A small right maxillary sinus air-fluid level is seen. Other: None. IMPRESSION: 1. No acute  intracranial abnormality. Electronically Signed   By: Virgina Norfolk M.D.   On: 01/16/2021 01:08   CT ABDOMEN PELVIS W CONTRAST  Result Date: 02/03/2021 CLINICAL DATA:  Abdominal abscess/infection suspected. Rectal bleeding for 7 days. Denies pain. History of COPD, HTN, DM, hyster, GB, oophorectomy EXAM: CT ABDOMEN AND PELVIS WITH CONTRAST TECHNIQUE: Multidetector CT imaging of the abdomen and pelvis was performed using the standard protocol following bolus administration of intravenous contrast. CONTRAST:  125m OMNIPAQUE IOHEXOL 300 MG/ML  SOLN COMPARISON:  CT abdomen pelvis 01/21/2021 FINDINGS: Lower chest: Partially visualized slightly improved trace pericardial effusion. Resolution of bilateral pleural effusions. No acute abnormality. Hepatobiliary: Focally hypodensity of the hepatic parenchyma along the falciform ligament likely represents focal fatty infiltration. No focal liver abnormality. Status post cholecystectomy. No biliary dilatation. Pancreas: No focal lesion. Normal pancreatic contour. No surrounding inflammatory changes. No main pancreatic ductal dilatation. Spleen: Normal in size without focal abnormality. Adrenals/Urinary Tract: No adrenal nodule bilaterally. Bilateral kidneys enhance symmetrically. No hydronephrosis. No hydroureter. The urinary bladder is unremarkable. Resolution of gas associated with the urinary bladder. On delayed imaging, there is no urothelial wall thickening and there are no filling defects in the opacified portions of the bilateral collecting systems or ureters. Stomach/Bowel: Stomach is within normal limits. No evidence of small bowel wall thickening or dilatation. Slightly improved but persistent bowel wall thickening, submucosal hyperemia, pericolonic fat stranding of the descending colon and rectosigmoid colon. Appendix appears normal. Vascular/Lymphatic: No abdominal aorta or iliac aneurysm. Mild noncalcified atherosclerotic plaque of the aorta and its  branches. No abdominal, pelvic, or inguinal lymphadenopathy. Reproductive: Status post hysterectomy. No adnexal masses. Other: No intraperitoneal free fluid. No intraperitoneal free gas. No organized fluid collection. Musculoskeletal: No abdominal wall hernia or abnormality. No suspicious lytic or blastic osseous lesions. No acute displaced fracture. Multilevel degenerative changes of the spine. IMPRESSION: 1. Slightly improved but persistent distal colitis. 2.  Partially visualized slightly improved trace pericardial effusion. Resolution of bilateral pleural effusions. 3.  Aortic Atherosclerosis (ICD10-I70.0). Electronically Signed   By: Iven Finn M.D.   On: 02/03/2021 15:12   CT ABDOMEN PELVIS W CONTRAST  Result Date: 01/21/2021 CLINICAL DATA:  Abdominal pain, acute nonlocalized fever, leukocytosis. Abdominal abscess/infection suspected. COVID positive. History of COPD, collagen vascular disease and Crohn's disease. EXAM: CT ABDOMEN AND PELVIS WITH CONTRAST TECHNIQUE: Multidetector CT imaging of the abdomen and pelvis was performed using the standard protocol following bolus administration of intravenous contrast. CONTRAST:  47m OMNIPAQUE IOHEXOL 350 MG/ML SOLN COMPARISON:  CT 09/14/2015. FINDINGS: Lower chest: New small to moderate dependent pleural effusions with associated bibasilar atelectasis at both lung bases. There is a small pericardial effusion as well. The heart size is normal. No acute vascular findings are evident. Hepatobiliary: The liver is normal in density without suspicious focal abnormality. No significant biliary dilatation status post cholecystectomy. Pancreas: Unremarkable. No pancreatic ductal dilatation or surrounding inflammatory changes. Spleen: Normal in size without focal abnormality. Adrenals/Urinary Tract: Both adrenal glands appear normal. Both kidneys appear normal. No evidence of urinary tract calculus, hydronephrosis or perinephric soft tissue stranding. The urinary  bladder is moderately distended. There is a small amount of air within the bladder lumen. No bladder wall thickening or immediate surrounding inflammatory changes identified. Stomach/Bowel: Enteric contrast was administered and has passed into the mid small bowel. The stomach appears normal for its degree of distention. There is no significant small bowel distension, wall thickening or surrounding inflammation. By report, the patient is status post appendectomy. The proximal colon is fluid-filled and mildly distended. There is diffuse wall thickening of the colon extending from the splenic flexure to the rectum. Wall thickening and enhancement are most prominent within the proximal rectum. Distally, the rectal walls are less well-defined, and distal rectal perforation is difficult to exclude. There is moderate perirectal soft tissue stranding. These inflammatory changes do not appear to directly abut the posterior wall of the bladder. No discrete fistula are seen. Vascular/Lymphatic: There are no enlarged abdominal or pelvic lymph nodes. No acute vascular findings. The portal, superior mesenteric and splenic veins are patent. Reproductive: Hysterectomy. No adnexal mass. There is no gas within the vagina. Other: Small amount of pelvic ascites. As above, possible perirectal fluid without drainable fluid collection or fistula. No other extraluminal fluid collections are identified. There is no free air. Musculoskeletal: No acute or significant osseous findings. No evidence of discitis or sacroiliitis. IMPRESSION: 1. Findings are consistent with distal colitis, extending from the splenic flexure of the colon into the rectum. This colitis is presumably secondary to the patient's Crohn's disease. 2. The walls of the distal rectum are ill-defined, and rectal perforation difficult to exclude. No drainable fluid collection identified. 3. Gas within the wall of the bladder without bladder wall thickening or immediate  surrounding inflammatory changes. The gas could be iatrogenic, although if catheterization has not been recently performed, a colovesical fistula would be a consideration. 4. No evidence of small-bowel inflammation or obstruction. 5. Bilateral pleural effusions with small pericardial effusion and bibasilar atelectasis. 6. These results will be called to the ordering clinician or representative by the Radiologist Assistant, and communication documented in the PACS or [employer] Electronically Signed   By: WRichardean SaleM.D.   On: 01/21/2021 16:45   [place]  Result Date: 01/16/2021 CLINICAL DATA:  Acute kidney injury EXAM: RENAL / URINARY TRACT ULTRASOUND COMPLETE COMPARISON:  None. FINDINGS: Right Kidney: Renal measurements: 10.7 x  5.0 x 5.5 cm = volume: 156 mL. Echogenicity within normal limits. No mass or hydronephrosis visualized. Left Kidney: Renal measurements: 10.3 x 6.0 x 5.3 cm = volume: 172 mL. Echogenicity within normal limits. No mass or hydronephrosis visualized. Bladder: Appears normal for degree of bladder distention. Bilateral ureteral jets are not noted, and the patient is not able to void at the time of the examination. Other: None. IMPRESSION: 1. Unremarkable sonographic appearance of the kidneys. No hydronephrosis. 2. Bilateral ureteral jets are not noted, and the patient is not able to void at the time of the examination. Correlate for urinary retention. Electronically Signed   By: Eddie Candle M.D.   On: 01/16/2021 13:04   US Carotid Bilateral  Result Date: 01/16/2021 CLINICAL DATA:  Altered mental status, seizures EXAM: BILATERAL CAROTID DUPLEX ULTRASOUND TECHNIQUE: Pearline Cables scale imaging, color Doppler and duplex ultrasound were performed of bilateral carotid and vertebral arteries in the neck. COMPARISON:  None. FINDINGS: Criteria: Quantification of carotid stenosis is based on velocity parameters that correlate the residual internal carotid diameter with NASCET-based stenosis  levels, using the diameter of the distal internal carotid lumen as the denominator for stenosis measurement. The following velocity measurements were obtained: RIGHT ICA: 127/43 cm/sec CCA: 497/02 cm/sec SYSTOLIC ICA/CCA RATIO:  0.9 ECA:  118 cm/sec LEFT ICA: 145/41 cm/sec CCA: 637/85 cm/sec SYSTOLIC ICA/CCA RATIO:  1.1 ECA:  107 cm/sec RIGHT CAROTID ARTERY: No significant atherosclerotic plaque or evidence of stenosis in the internal carotid artery. RIGHT VERTEBRAL ARTERY:  Patent with normal antegrade flow. LEFT CAROTID ARTERY: Trace smooth heterogeneous atherosclerotic plaque in the proximal internal carotid artery. By peak systolic velocity criteria in the region of plaque, the estimated stenosis is less than 50%. LEFT VERTEBRAL ARTERY:  Patent with normal antegrade flow. IMPRESSION: 1. Mild (1-49%) stenosis proximal left internal carotid artery secondary to trace smooth heterogeneous atherosclerotic plaque. 2. No significant atherosclerotic plaque or evidence of stenosis in the right internal carotid artery. 3. Vertebral arteries are patent with normal antegrade flow. Signed, Criselda Peaches, MD, [facility] Electronically Signed   By: Jacqulynn Cadet M.D.   On: 01/16/2021 11:00   DG CHEST PORT 1 VIEW  Result Date: 01/17/2021 CLINICAL DATA:  Severe sepsis.  Patient is COVID-19 positive. EXAM: PORTABLE CHEST 1 VIEW COMPARISON:  January 16, 2021 FINDINGS: Heart, hila, mediastinum are normal. No pneumothorax. No nodules or masses. Minimal haziness in the left costophrenic angle, likely atelectasis. No suspicious infiltrates are identified. IMPRESSION: Minimal haziness in the left costophrenic angle is favored to represent atelectasis. No other abnormalities are identified. No suspicious infiltrates. Electronically Signed   By: Dorise Bullion III M.D.   On: 01/17/2021 08:13   DG Chest Port 1 View  Result Date: 01/16/2021 CLINICAL DATA:   Seizure. EXAM: PORTABLE CHEST 1 VIEW COMPARISON:  May 09, 2016 FINDINGS: The heart size and mediastinal contours are within normal limits. Both lungs are clear. The visualized skeletal structures are unremarkable. IMPRESSION: No active disease. Electronically Signed   By: Virgina Norfolk M.D.   On: 01/16/2021 01:02   DG Foot 2 Views Right  Result Date: 01/19/2021 CLINICAL DATA:  Recent fall with bruising EXAM: RIGHT FOOT - 2 VIEW COMPARISON:  None. FINDINGS: There is no evidence of fracture or dislocation. There is no evidence of arthropathy or other focal bone abnormality. Soft tissues are unremarkable. IMPRESSION: Negative. Electronically Signed   By: Jorje Guild M.D.   On: 01/19/2021 11:14   EEG  adult  Result Date: 01/16/2021 Lora Havens, MD     01/16/2021 10:16 PM Patient Name: Janett Kamath MRN: [id number] Epilepsy Attending: Lora Havens Referring Physician/Provider: Dr Clearence Ped, [place] B Date: 01/16/2021 Duration: 22.32 mins Patient history: 50 yo F with AMS and metabolic changes, Likely due to xanax withdrawal seizures. EEG to evaluate for seizure Level of alertness: Awake AEDs during EEG study: LEV, GBP Technical aspects: This EEG study was done with scalp electrodes positioned according to the 10-20 International system of electrode placement. Electrical activity was acquired at a sampling rate of 500Hz  and reviewed with a high frequency filter of 70Hz  and a low frequency filter of 1Hz . EEG data were recorded continuously and digitally stored. Description: The posterior dominant rhythm consists of 9 Hz activity of moderate voltage (25-35 uV) seen predominantly in posterior head regions, symmetric and reactive to eye opening and eye closing. EEG showed intermittent generalized 5 to 6 Hz theta slowing. Hyperventilation and photic stimulation were not performed.   ABNORMALITY - Intermittent slow, generalized IMPRESSION: This study is suggestive of mild diffuse encephalopathy,  nonspecific etiology. No seizures or epileptiform discharges were seen throughout the recording. Lora Havens   ECHOCARDIOGRAM COMPLETE  Result Date: 01/16/2021    ECHOCARDIOGRAM REPORT   Patient Name:   KATRINA DADDONA Date of Exam: 01/16/2021 Medical Rec #:  [id number]     Height:       66.0 in Accession #:    [id number]    Weight:       145.0 lb Date of Birth:  [birth year]     BSA:          1.744 m Patient Age:    53 years      BP:           81/20 mmHg Patient Gender: F             HR:           111 bpm. Exam Location:  [name] Procedure: 2D Echo, Cardiac Doppler and Color Doppler Indications:    Syncope  History:        Patient has prior history of Echocardiogram examinations, most                 recent 07/11/2010. COPD, Signs/Symptoms:Dyspnea; Risk                 Factors:Tobacco abuse and Diabetes. COVID +.  Sonographer:    Wenda Low Referring Phys: [facility]  1. Left ventricular ejection fraction, by estimation, is 70 to 75%. The left ventricle has hyperdynamic function. The left ventricle has no regional wall motion abnormalities. There is mild left ventricular hypertrophy. Left ventricular diastolic parameters are indeterminate.  2. Right ventricular systolic function is normal. The right ventricular size is normal. Tricuspid regurgitation signal is inadequate for assessing PA pressure.  3. No right atrial or right ventricular collapse to suggest tamponade physiology. Respiratory variation in mitral outflow not assessed. Presence of sinus tachycardia does raise concern however for potential hemodynamic significance. Follow-up clinically  and suggest repeat limited echocardiogram within tht next 48 hours. Moderate pericardial effusion. The pericardial effusion is circumferential.  4. The mitral valve is grossly normal. Mild mitral valve regurgitation.  5. The aortic valve is tricuspid. Aortic valve regurgitation is not visualized. No aortic stenosis is present. Aortic valve  mean gradient measures 6.0 mmHg.  6. The inferior vena cava is normal in size with <50% respiratory variability, suggesting right atrial pressure of  8 mmHg. Comparison(s): No prior Echocardiogram. FINDINGS  Left Ventricle: Left ventricular ejection fraction, by estimation, is 70 to 75%. The left ventricle has hyperdynamic function. The left ventricle has no regional wall motion abnormalities. The left ventricular internal cavity size was normal in size. There is mild left ventricular hypertrophy. Left ventricular diastolic parameters are indeterminate. Right Ventricle: The right ventricular size is normal. No increase in right ventricular wall thickness. Right ventricular systolic function is normal. Tricuspid regurgitation signal is inadequate for assessing PA pressure. Left Atrium: Left atrial size was normal in size. Right Atrium: Right atrial size was normal in size. Pericardium: No right atrial or right ventricular collapse to suggest tamponade physiology. Respiratory variation in mitral outflow not assessed. Presence of sinus tachycardia does raise concern however for potential hemodynamic significance. Follow-up clinically and suggest repeat limited echocardiogram within tht next 48 hours. A moderately sized pericardial effusion is present. The pericardial effusion is circumferential. Mitral Valve: The mitral valve is grossly normal. Mild mitral valve regurgitation. MV peak gradient, 13.1 mmHg. The mean mitral valve gradient is 6.0 mmHg. Tricuspid Valve: The tricuspid valve is grossly normal. Tricuspid valve regurgitation is trivial. Aortic Valve: The aortic valve is tricuspid. Aortic valve regurgitation is not visualized. No aortic stenosis is present. Aortic valve mean gradient measures 6.0 mmHg. Aortic valve peak gradient measures 10.8 mmHg. Aortic valve area, by VTI measures 2.42  cm. Pulmonic Valve: The pulmonic valve was grossly normal. Pulmonic valve regurgitation is trivial. Aorta: The aortic root is  normal in size and structure. Venous: The inferior vena cava is normal in size with less than 50% respiratory variability, suggesting right atrial pressure of 8 mmHg. IAS/Shunts: No atrial level shunt detected by color flow Doppler.  LEFT VENTRICLE PLAX 2D LVIDd:         3.70 cm  Diastology LVIDs:         2.40 cm  LV e' medial:    9.17 cm/s LV PW:         1.10 cm  LV E/e' medial:  11.2 LV IVS:        1.10 cm  LV e' lateral:   9.17 cm/s LVOT diam:     2.00 cm  LV E/e' lateral: 11.2 LV SV:         58 LV SV Index:   33 LVOT Area:     3.14 cm  RIGHT VENTRICLE RV Basal diam:  2.30 cm RV Mid diam:    2.00 cm RV S prime:     27.70 cm/s TAPSE (M-mode): 3.3 cm LEFT ATRIUM             Index       RIGHT ATRIUM           Index LA diam:        3.30 cm 1.89 cm/m  RA Area:     10.80 cm LA Vol (A2C):   41.8 ml 23.96 ml/m RA Volume:   17.70 ml  10.15 ml/m LA Vol (A4C):   34.7 ml 19.89 ml/m LA Biplane Vol: 38.5 ml 22.07 ml/m  AORTIC VALVE AV Area (Vmax):    2.47 cm AV Area (Vmean):   2.20 cm AV Area (VTI):     2.42 cm AV Vmax:           164.00 cm/s AV Vmean:          109.000 cm/s AV VTI:            0.240 m AV Peak Grad:  10.8 mmHg AV Mean Grad:      6.0 mmHg LVOT Vmax:         129.00 cm/s LVOT Vmean:        76.400 cm/s LVOT VTI:          0.185 m LVOT/AV VTI ratio: 0.77  AORTA Ao Root diam: 3.20 cm MITRAL VALVE MV Area (PHT): 5.02 cm     SHUNTS MV Area VTI:   1.73 cm     Systemic VTI:  0.18 m MV Peak grad:  13.1 mmHg    Systemic Diam: 2.00 cm MV Mean grad:  6.0 mmHg MV Vmax:       1.81 m/s MV Vmean:      109.0 cm/s MV Decel Time: 151 msec MV E velocity: 103.00 cm/s MV A velocity: 168.00 cm/s MV E/A ratio:  0.61 Rozann Lesches MD Electronically signed by Rozann Lesches MD Signature Date/Time: 01/16/2021/4:08:15 PM    Final    ECHOCARDIOGRAM LIMITED  Result Date: 01/22/2021    ECHOCARDIOGRAM LIMITED REPORT   Patient Name:   JENET DURIO Date of Exam: 01/22/2021 Medical Rec #:  [id number]     Height:       66.0 in  Accession #:    [id number]    Weight:       161.6 lb Date of Birth:  10-Sep-1970     BSA:          1.827 m Patient Age:    71 years      BP:           107/64 mmHg Patient Gender: F             HR:           88 bpm. Exam Location:  [name] Procedure: Limited Echo Indications:    Pericardial effusion  History:        Patient has prior history of Echocardiogram examinations, most                 recent 01/19/2021. COPD, Arrythmias:PVC, Signs/Symptoms:Chest                 Pain; Risk Factors:Diabetes. COVID +, Tobacco abuse, Sepsis.  Sonographer:    Wenda Low Referring Phys: [phone] Sava Proby IMPRESSIONS  1. Left ventricular ejection fraction, by estimation, is 60 to 65%. The left ventricle has normal function.  2. Moderate pericardial effusion. The pericardial effusion is circumferential. There is no evidence of cardiac tamponade. Effusion is stable compared to the 01/19/21 study  3. The inferior vena cava is normal in size with greater than 50% respiratory variability, suggesting right atrial pressure of 3 mmHg.  4. Limited echo to evaluate pericardial effusion FINDINGS  Left Ventricle: Left ventricular ejection fraction, by estimation, is 60 to 65%. The left ventricle has normal function. Pericardium: A moderately sized pericardial effusion is present. The pericardial effusion is circumferential. There is no evidence of cardiac tamponade. Venous: The inferior vena cava is normal in size with greater than 50% respiratory variability, suggesting right atrial pressure of 3 mmHg. LEFT VENTRICLE PLAX 2D LVIDd:         3.20 cm LVIDs:         2.40 cm LV PW:         1.20 cm LV IVS:        0.90 cm LVOT diam:     2.10 cm LVOT Area:     3.46 cm  LEFT ATRIUM  Index LA diam:    3.20 cm 1.75 cm/m   AORTA Ao Root diam: 3.40 cm  SHUNTS Systemic Diam: 2.10 cm Carlyle Dolly MD Electronically signed by Carlyle Dolly MD Signature Date/Time: 01/22/2021/11:50:46 AM    Final    ECHOCARDIOGRAM LIMITED  Result Date:  01/19/2021    ECHOCARDIOGRAM LIMITED REPORT   Patient Name:   [facility] Date of Exam: 01/19/2021 Medical Rec #:  [id number]     Height:       66.0 in Accession #:    [id number]    Weight:       161.6 lb Date of Birth:  Sep 13, 1970     BSA:          1.827 m Patient Age:    54 years      BP:           99/68 mmHg Patient Gender: F             HR:           96 bpm. Exam Location:  [name] Procedure: Limited Echo Indications:    Pericardial effusion  History:        Patient has prior history of Echocardiogram examinations, most                 recent 01/16/2021. COPD, Arrythmias:PVC, Signs/Symptoms:Chest                 Pain; Risk Factors:Diabetes. COVID +, Tobacco Abuse, Sepsis,                 Pericardial effusion.  Sonographer:    Wenda Low Referring Phys: [place]  1. Left ventricular ejection fraction, by estimation, is 60 to 65%. The left ventricle has normal function.  2. Moderate pericardial effusion. The pericardial effusion is circumferential. There is no evidence of cardiac tamponade. Effusion looks larger compared to the 01/16/2021 study however remains overall moderate by measurements. Recommend repeating limited  study in 72 hours. FINDINGS  Left Ventricle: Left ventricular ejection fraction, by estimation, is 60 to 65%. The left ventricle has normal function. Pericardium: A moderately sized pericardial effusion is present. The pericardial effusion is circumferential. There is no evidence of cardiac tamponade. LEFT VENTRICLE PLAX 2D LVIDd:         3.90 cm LVIDs:         2.40 cm LV PW:         1.00 cm LV IVS:        1.00 cm LVOT diam:     2.00 cm LVOT Area:     3.14 cm  LEFT ATRIUM         Index LA diam:    3.40 cm 1.86 cm/m   SHUNTS Systemic Diam: 2.00 cm Carlyle Dolly MD Electronically signed by Carlyle Dolly MD Signature Date/Time: 01/19/2021/11:20:45 AM    Final    [place] EKG SITE RITE  Result Date: 01/16/2021 If Site Rite image not attached, placement could not  be confirmed due to current cardiac rhythm.   Orson Eva, DO  [facility]  If 7PM-7AM, please contact night-coverage [email or web address] Password [email or web address] 02/07/2021, 1:21 PM   LOS: 4 days

## 2021-02-08 DIAGNOSIS — K56699 Other intestinal obstruction unspecified as to partial versus complete obstruction: Secondary | ICD-10-CM

## 2021-02-08 DIAGNOSIS — K921 Melena: Secondary | ICD-10-CM | POA: Diagnosis not present

## 2021-02-08 DIAGNOSIS — K529 Noninfective gastroenteritis and colitis, unspecified: Secondary | ICD-10-CM | POA: Diagnosis not present

## 2021-02-08 DIAGNOSIS — F17200 Nicotine dependence, unspecified, uncomplicated: Secondary | ICD-10-CM | POA: Diagnosis not present

## 2021-02-08 LAB — CBC
HCT: 33.1 % — ABNORMAL LOW (ref 36.0–46.0)
Hemoglobin: 10.6 g/dL — ABNORMAL LOW (ref 12.0–15.0)
MCH: 30.3 pg (ref 26.0–34.0)
MCHC: 32 g/dL (ref 30.0–36.0)
MCV: 94.6 fL (ref 80.0–100.0)
Platelets: 406 10*3/uL — ABNORMAL HIGH (ref 150–400)
RBC: 3.5 MIL/uL — ABNORMAL LOW (ref 3.87–5.11)
RDW: 13.4 % (ref 11.5–15.5)
WBC: 5 10*3/uL (ref 4.0–10.5)
nRBC: 0 % (ref 0.0–0.2)

## 2021-02-08 MED ORDER — LEVETIRACETAM 500 MG PO TABS
750.0000 mg | ORAL_TABLET | Freq: Two times a day (BID) | ORAL | Status: DC
  Administered 2021-02-08 – 2021-02-22 (×28): 750 mg via ORAL
  Filled 2021-02-08 (×29): qty 1

## 2021-02-08 NOTE — Progress Notes (Signed)
Subjective: Patient states she is doing a little worse today, Note rectal bleeding last night. Tolerating diet. No abdominal pain. Hgb stable  Objective: Vital signs in last 24 hours: Temp:  [98 F (36.7 C)-98.8 F (37.1 C)] 98 F (36.7 C) (10/09 0459) Pulse Rate:  [92] 92 (10/09 0459) Resp:  [16-18] 16 (10/09 0459) BP: (105)/(50-79) 105/50 (10/09 0459) SpO2:  [97 %] 97 % (10/09 0459) Last BM Date: 02/06/21 General:   Alert and oriented, pleasant Head:  Normocephalic and atraumatic. Eyes:  No icterus, sclera clear. Conjuctiva pink.  Abdomen:  Bowel sounds present, soft, non-tender, non-distended. No HSM or hernias noted. No rebound or guarding. No masses appreciated  Msk:  Symmetrical without gross deformities. Normal posture. Extremities:  Without clubbing or edema. Neurologic:  Alert and  oriented x4;  grossly normal neurologically. Skin:  Warm and dry, intact without significant lesions.  Cervical Nodes:  No significant cervical adenopathy. Psych:  Alert and cooperative. Normal mood and affect.  Intake/Output from previous day: 10/08 0701 - 10/09 0700 In: 723.5 [P.O.:600; IV Piggyback:123.5] Out: -  Intake/Output this shift: Total I/O In: 240 [P.O.:240] Out: -   Lab Results: Recent Labs    02/06/21 0533 02/08/21 0451  WBC 4.1 5.0  HGB 11.4* 10.6*  HCT 35.8* 33.1*  PLT 450* 406*    BMET Recent Labs    02/06/21 0533  NA 141  K 3.7  CL 108  CO2 27  GLUCOSE 89  BUN 5*  CREATININE 0.77  CALCIUM 9.0    LFT No results for input(s): PROT, ALBUMIN, AST, ALT, ALKPHOS, BILITOT, BILIDIR, IBILI in the last 72 hours. PT/INR No results for input(s): LABPROT, INR in the last 72 hours. Hepatitis Panel No results for input(s): HEPBSAG, HCVAB, HEPAIGM, HEPBIGM in the last 72 hours.   Studies/Results: No results found.  Assessment: *Left-sided colitis *Sigmoid stricture *Rectal bleeding-improved *Left lower quadrant abdominal pain  Plan: Flexible  sigmoidoscopy 02/06/2021 showed moderate to severe colitis extending from her rectum up to 15 cm at which point a tight sigmoid stricture was encountered, unable to pass pediatric colonoscope. Biopsies taken  GI pathogen panel negative, C. difficile testing negative.  Antibiotics have been discontinued.  Given her reported history of Crohn's disease in the past and negative stool testing for infectious causes, think it is reasonable to consider this IBD related.  Would continue on IV Solu-Medrol while inpatient.  She will need extended course of oral prednisone upon discharge.  We should have her biopsies results by Monday.  She will need repeat flexible sigmoidoscopy in 3 to 4 weeks.  She may need surgical resection of sigmoid stricture if not improved.  GI to continue to follow.  Elon Alas. Abbey Chatters, D.O. Gastroenterology and Hepatology [facility]   LOS: 5 days    02/08/2021, 11:23 AM

## 2021-02-08 NOTE — Progress Notes (Signed)
PROGRESS NOTE  [name] DGL:[id number] DOB: 1971-04-15 DOA: 02/03/2021 PCP: Teodoro Kil, PA-C  Brief History:   50 y.o. female, with history of COPD, questionable Crohn's, GERD, HTN, T2DM, xanax withdrawal seizure, tobacco abuse presenting with 1 week history of hematochezia and intermittent nausea and vomiting.  The patient was recent admitted to the hospital from 01/16/2021 to 01/24/2021 secondary to sepsis due to colitis.  The patient was seen by GI and it was felt that the patient could be treated with antibiotics.  She was discharged home with 5 additional days of Cipro and Flagyl.  Any procedures were felt to be able to be deferred into the outpatient setting.  The patient states that she was noncompliant with her antibiotics.  She only took 1 day of her antibiotics after discharge which caused her to have nausea and vomiting.  As result, she stopped taking the antibiotics.  During her last hospitalization, the patient was also treated for Xanax withdrawal seizures as well as a COVID-19 infection for which she was largely asymptomatic. She denies any fevers, chills, chest pain, shortness breath, headache, neck pain, coughing, hemoptysis.  She continues to smoke occasionally. In the emergency department, the patient was afebrile hemodynamically stable with oxygen saturation 100% room air.  WBC 8.8, hemoglobin 13.4, platelets 729K.  Sodium 136, potassium 3.7, BUN less than 5, creatinine 0.86.  CT of the abdomen and pelvis showed slightly improved but persistent bowel wall thickening and pericolonic stranding involving the descending and rectosigmoid colon.  She was started on ceftriaxone and metronidazole.  GI was consulted to assist.  She underwent flex sig on 02/06/21 which suggested IBD.  Antibiotics were discontinued and she was started on IV solumedrol.  10/9--abdominal pain a little worse than 10/8 but still better than admission day.  Had "1/4 cup" blood per rectum last night.   Tolerating diet. Hgb remains stable   Assessment/Plan: Hematochezia/colitis -Likely secondary to the patient's colitis -She was seen by GI during her last hospital admission who felt this was more likely to be infectious rather than IBD. -initially started ceftriaxone and metronidazole -she was tolerating ceftriaxone without difficulty -Consult GI>>planning colonoscopy -C. difficile negative -Stool pathogen panel--neg -10/7 flex sig--Diffuse severe inflammation characterized by erosions, erythema and shallow ulcerations was found in the rectum and in the sigmoid colon extending up to approx 15 cm from the anal verge -10/7 discussed with GI, Dr. Carver--d/c abx, start IV solumedrol for presumptive IBD flare -continue IV steroids   Hypokalemia/hypomagnesemia -Repleted   Anxiety -Restart Xanax home dose -PDMP reviewed shows that the patient received Xanax 1 mg, #90 monthly   Pericardial effusion -01/22/2021 echo EF 60 to 65%, moderate pericardial effusion unchanged from 01/19/2021 -Patient is hemodynamically stable   COPD -Stable on room air   Tobacco abuse -Tobacco cessation discussed   THC abuse -cessation discussed   Personal History of PCN Allergy -patient has tolerated ceftriaxone without difficulty this admission             Status is: Inpatient   Remains inpatient appropriate because:Inpatient level of care appropriate due to severity of illness   Dispo: The patient is from: Home              Anticipated d/c is to: Home              Patient currently is not medically stable to d/c.              Difficult  to place patient No               Family Communication:  significant other at bedside 10/7   Consultants:  GI   Code Status:  FULL    DVT Prophylaxis:  SCDs     Procedures: As Listed in Progress Note Above   Antibiotics: Ceftriaxone 10/5>>10/7 Metronidazole 10/4>>10/7    Subjective: Pt states abdominal pain a little worse than 10/8 but  still better than admission day.  Had "1/4 cup" blood per rectum last night.  Denies f/c, cp, sob, n/v  Objective: Vitals:   02/06/21 2123 02/07/21 0539 02/07/21 2101 02/08/21 0459  BP: 104/62 127/71 105/79 (!) 105/50  Pulse: 86 86 92 92  Resp: 20 16 18 16   Temp: 97.6 F (36.4 C) 98.1 F (36.7 C) 98.8 F (37.1 C) 98 F (36.7 C)  TempSrc: Oral Oral    SpO2: 98% 97% 97% 97%  Weight:      Height:        Intake/Output Summary (Last 24 hours) at 02/08/2021 1118 Last data filed at 02/08/2021 0900 Gross per 24 hour  Intake 723.48 ml  Output --  Net 723.48 ml   Weight change:  Exam:  General:  Pt is alert, follows commands appropriately, not in acute distress HEENT: No icterus, No thrush, No neck mass, [place]/AT Cardiovascular: RRR, S1/S2, no rubs, no gallops Respiratory: bibasilar crackles. No wheeze Abdomen: Soft/+BS, mild diffuse tender, non distended, no guarding Extremities: No edema, No lymphangitis, No petechiae, No rashes, no synovitis   Data Reviewed: I have personally reviewed following labs and imaging studies Basic Metabolic Panel: Recent Labs  Lab 02/03/21 1206 02/03/21 1304 02/03/21 1635 02/04/21 0455 02/05/21 0513 02/06/21 0533  NA 133* 135  --  136 139 141  K 3.2* 3.2*  --  3.7 3.5 3.7  CL 96* 96*  --  104 106 108  CO2 27  --   --  27 27 27   GLUCOSE 118* 120*  --  111* 97 89  BUN 8 7  --  <5* 6 5*  CREATININE 0.86 0.70  --  0.77 0.90 0.77  CALCIUM 9.5  --   --  8.2* 8.3* 9.0  MG  --   --  1.6*  --  2.0 2.0   Liver Function Tests: Recent Labs  Lab 02/03/21 1206  AST 41  ALT 29  ALKPHOS 121  BILITOT 0.4  PROT 7.6  ALBUMIN 3.7   No results for input(s): LIPASE, AMYLASE in the last 168 hours. No results for input(s): AMMONIA in the last 168 hours. Coagulation Profile: No results for input(s): INR, PROTIME in the last 168 hours. CBC: Recent Labs  Lab 02/03/21 1206 02/03/21 1304 02/04/21 0455 02/05/21 0513 02/06/21 0533 02/08/21 0451  WBC  8.8  --  4.3 5.0 4.1 5.0  HGB 13.4 14.3 10.7* 10.7* 11.4* 10.6*  HCT 40.9 42.0 34.2* 33.6* 35.8* 33.1*  MCV 92.3  --  95.5 96.3 96.5 94.6  PLT 729*  --  486* 507* 450* 406*   Cardiac Enzymes: No results for input(s): CKTOTAL, CKMB, CKMBINDEX, TROPONINI in the last 168 hours. BNP: Invalid input(s): POCBNP CBG: Recent Labs  Lab 02/05/21 0748 02/05/21 1209 02/05/21 1636 02/06/21 1042 02/06/21 2121  GLUCAP 98 78 80 81 105*   HbA1C: No results for input(s): HGBA1C in the last 72 hours. Urine analysis:    Component Value Date/Time   COLORURINE YELLOW 02/04/2021 [place] 02/04/2021 0956  LABSPEC 1.005 02/04/2021 0956   PHURINE 6.0 02/04/2021 0956   GLUCOSEU NEGATIVE 02/04/2021 0956   GLUCOSEU NEG mg/dL 12/24/2009 2317   HGBUR MODERATE (A) 02/04/2021 0956   BILIRUBINUR NEGATIVE 02/04/2021 0956   KETONESUR NEGATIVE 02/04/2021 0956   PROTEINUR 30 (A) 02/04/2021 0956   UROBILINOGEN 0.2 12/26/2013 1530   NITRITE NEGATIVE 02/04/2021 0956   LEUKOCYTESUR MODERATE (A) 02/04/2021 0956   Sepsis Labs: @LABRCNTIP (procalcitonin:4,lacticidven:4) ) Recent Results (from the past 240 hour(s))  C Difficile Quick Screen w PCR reflex     Status: None   Collection Time: 02/03/21  9:49 PM   Specimen: STOOL  Result Value Ref Range Status   C Diff antigen NEGATIVE NEGATIVE Final   C Diff toxin NEGATIVE NEGATIVE Final   C Diff interpretation No C. difficile detected.  Final    Comment: Performed at [facility], [address], [place], [place] [address]  Gastrointestinal Panel by PCR , Stool     Status: None   Collection Time: 02/03/21  9:49 PM   Specimen: STOOL  Result Value Ref Range Status   Campylobacter species NOT DETECTED NOT DETECTED Final   Plesimonas shigelloides NOT DETECTED NOT DETECTED Final   Salmonella species NOT DETECTED NOT DETECTED Final   Yersinia enterocolitica NOT DETECTED NOT DETECTED Final   Vibrio species NOT DETECTED NOT DETECTED Final   Vibrio  cholerae NOT DETECTED NOT DETECTED Final   Enteroaggregative E coli (EAEC) NOT DETECTED NOT DETECTED Final   Enteropathogenic E coli (EPEC) NOT DETECTED NOT DETECTED Final   Enterotoxigenic E coli (ETEC) NOT DETECTED NOT DETECTED Final   Shiga like toxin producing E coli (STEC) NOT DETECTED NOT DETECTED Final   Shigella/Enteroinvasive E coli (EIEC) NOT DETECTED NOT DETECTED Final   Cryptosporidium NOT DETECTED NOT DETECTED Final   Cyclospora cayetanensis NOT DETECTED NOT DETECTED Final   Entamoeba histolytica NOT DETECTED NOT DETECTED Final   Giardia lamblia NOT DETECTED NOT DETECTED Final   Adenovirus F40/41 NOT DETECTED NOT DETECTED Final   Astrovirus NOT DETECTED NOT DETECTED Final   Norovirus GI/GII NOT DETECTED NOT DETECTED Final   Rotavirus A NOT DETECTED NOT DETECTED Final   Sapovirus (I, II, IV, and V) NOT DETECTED NOT DETECTED Final    Comment: Performed at [facility], [address], [place], [place] [address]  Urine Culture     Status: None   Collection Time: 02/04/21  9:56 AM   Specimen: Urine, Clean Catch  Result Value Ref Range Status   Specimen Description   Final    URINE, CLEAN CATCH Performed at [facility], [address], [place], [place] [address]    Special Requests   Final    NONE Performed at [facility], [address] [place], [place] [address]    Culture   Final    NO GROWTH Performed at [facility], [address] [place], [place] [address]    Report Status 02/05/2021 FINAL  Final  SARS Coronavirus 2 by RT PCR (hospital order, performed in [facility] hospital lab) Nasopharyngeal Nasopharyngeal Swab     Status: None   Collection Time: 02/06/21  6:31 AM   Specimen: Nasopharyngeal Swab  Result Value Ref Range Status   SARS Coronavirus 2 NEGATIVE NEGATIVE Final    Comment: (NOTE) SARS-CoV-2 target nucleic acids are NOT DETECTED.  The SARS-CoV-2 RNA is generally detectable in upper and lower respiratory specimens during the acute  phase of infection. The lowest concentration of SARS-CoV-2 viral copies this assay  can detect is 250 copies / mL. A negative result does not preclude SARS-CoV-2 infection and should not be used as the sole basis for treatment or other patient management decisions.  A negative result may occur with improper specimen collection / handling, submission of specimen other than nasopharyngeal swab, presence of viral mutation(s) within the areas targeted by this assay, and inadequate number of viral copies (<250 copies / mL). A negative result must be combined with clinical observations, patient history, and epidemiological information.  Fact Sheet for Patients:   StrictlyIdeas.no  Fact Sheet for Healthcare Providers: BankingDealers.co.za  This test is not yet approved or  cleared by the [place] FDA and has been authorized for detection and/or diagnosis of SARS-CoV-2 by FDA under an Emergency Use Authorization (EUA).  This EUA will remain in effect (meaning this test can be used) for the duration of the COVID-19 declaration under Section 564(b)(1) of the Act, 21 U.S.C. section 360bbb-3(b)(1), unless the authorization is terminated or revoked sooner.  Performed at [facility], [address] [place], [place] [address]      Scheduled Meds:  ALPRAZolam  1 mg Oral TID   hydrocortisone   Rectal BID   levETIRAcetam  750 mg Oral BID   methylPREDNISolone (SOLU-MEDROL) injection  60 mg Intravenous Daily   pantoprazole  40 mg Oral Daily   Continuous Infusions:  Procedures/Studies: CT HEAD WO CONTRAST (5MM)  Result Date: 01/16/2021 CLINICAL DATA:  Status post seizure. EXAM: CT HEAD WITHOUT CONTRAST TECHNIQUE: Contiguous axial images were obtained from the base of the skull through the vertex without intravenous contrast. COMPARISON:  June 03, 2020 FINDINGS: Brain: No evidence of acute infarction, hemorrhage, hydrocephalus, extra-axial  collection or mass lesion/mass effect. Vascular: No hyperdense vessel or unexpected calcification. Skull: Normal. Negative for fracture or focal lesion. Sinuses/Orbits: A small right maxillary sinus air-fluid level is seen. Other: None. IMPRESSION: 1. No acute intracranial abnormality. Electronically Signed   By: Virgina Norfolk M.D.   On: 01/16/2021 01:08   CT ABDOMEN PELVIS W CONTRAST  Result Date: 02/03/2021 CLINICAL DATA:  Abdominal abscess/infection suspected. Rectal bleeding for 7 days. Denies pain. History of COPD, HTN, DM, hyster, GB, oophorectomy EXAM: CT ABDOMEN AND PELVIS WITH CONTRAST TECHNIQUE: Multidetector CT imaging of the abdomen and pelvis was performed using the standard protocol following bolus administration of intravenous contrast. CONTRAST:  1109m OMNIPAQUE IOHEXOL 300 MG/ML  SOLN COMPARISON:  CT abdomen pelvis 01/21/2021 FINDINGS: Lower chest: Partially visualized slightly improved trace pericardial effusion. Resolution of bilateral pleural effusions. No acute abnormality. Hepatobiliary: Focally hypodensity of the hepatic parenchyma along the falciform ligament likely represents focal fatty infiltration. No focal liver abnormality. Status post cholecystectomy. No biliary dilatation. Pancreas: No focal lesion. Normal pancreatic contour. No surrounding inflammatory changes. No main pancreatic ductal dilatation. Spleen: Normal in size without focal abnormality. Adrenals/Urinary Tract: No adrenal nodule bilaterally. Bilateral kidneys enhance symmetrically. No hydronephrosis. No hydroureter. The urinary bladder is unremarkable. Resolution of gas associated with the urinary bladder. On delayed imaging, there is no urothelial wall thickening and there are no filling defects in the opacified portions of the bilateral collecting systems or ureters. Stomach/Bowel: Stomach is within normal limits. No evidence of small bowel wall thickening or dilatation. Slightly improved but persistent bowel wall  thickening, submucosal hyperemia, pericolonic fat stranding of the descending colon and rectosigmoid colon. Appendix appears normal. Vascular/Lymphatic: No abdominal aorta or iliac aneurysm. Mild noncalcified atherosclerotic plaque of the aorta and its branches. No abdominal, pelvic, or inguinal lymphadenopathy. Reproductive: Status  post hysterectomy. No adnexal masses. Other: No intraperitoneal free fluid. No intraperitoneal free gas. No organized fluid collection. Musculoskeletal: No abdominal wall hernia or abnormality. No suspicious lytic or blastic osseous lesions. No acute displaced fracture. Multilevel degenerative changes of the spine. IMPRESSION: 1. Slightly improved but persistent distal colitis. 2. Partially visualized slightly improved trace pericardial effusion. Resolution of bilateral pleural effusions. 3.  Aortic Atherosclerosis (ICD10-I70.0). Electronically Signed   By: Iven Finn M.D.   On: 02/03/2021 15:12   CT ABDOMEN PELVIS W CONTRAST  Result Date: 01/21/2021 CLINICAL DATA:  Abdominal pain, acute nonlocalized fever, leukocytosis. Abdominal abscess/infection suspected. COVID positive. History of COPD, collagen vascular disease and Crohn's disease. EXAM: CT ABDOMEN AND PELVIS WITH CONTRAST TECHNIQUE: Multidetector CT imaging of the abdomen and pelvis was performed using the standard protocol following bolus administration of intravenous contrast. CONTRAST:  43m OMNIPAQUE IOHEXOL 350 MG/ML SOLN COMPARISON:  CT 09/14/2015. FINDINGS: Lower chest: New small to moderate dependent pleural effusions with associated bibasilar atelectasis at both lung bases. There is a small pericardial effusion as well. The heart size is normal. No acute vascular findings are evident. Hepatobiliary: The liver is normal in density without suspicious focal abnormality. No significant biliary dilatation status post cholecystectomy. Pancreas: Unremarkable. No pancreatic ductal dilatation or surrounding inflammatory  changes. Spleen: Normal in size without focal abnormality. Adrenals/Urinary Tract: Both adrenal glands appear normal. Both kidneys appear normal. No evidence of urinary tract calculus, hydronephrosis or perinephric soft tissue stranding. The urinary bladder is moderately distended. There is a small amount of air within the bladder lumen. No bladder wall thickening or immediate surrounding inflammatory changes identified. Stomach/Bowel: Enteric contrast was administered and has passed into the mid small bowel. The stomach appears normal for its degree of distention. There is no significant small bowel distension, wall thickening or surrounding inflammation. By report, the patient is status post appendectomy. The proximal colon is fluid-filled and mildly distended. There is diffuse wall thickening of the colon extending from the splenic flexure to the rectum. Wall thickening and enhancement are most prominent within the proximal rectum. Distally, the rectal walls are less well-defined, and distal rectal perforation is difficult to exclude. There is moderate perirectal soft tissue stranding. These inflammatory changes do not appear to directly abut the posterior wall of the bladder. No discrete fistula are seen. Vascular/Lymphatic: There are no enlarged abdominal or pelvic lymph nodes. No acute vascular findings. The portal, superior mesenteric and splenic veins are patent. Reproductive: Hysterectomy. No adnexal mass. There is no gas within the vagina. Other: Small amount of pelvic ascites. As above, possible perirectal fluid without drainable fluid collection or fistula. No other extraluminal fluid collections are identified. There is no free air. Musculoskeletal: No acute or significant osseous findings. No evidence of discitis or sacroiliitis. IMPRESSION: 1. Findings are consistent with distal colitis, extending from the splenic flexure of the colon into the rectum. This colitis is presumably secondary to the  patient's Crohn's disease. 2. The walls of the distal rectum are ill-defined, and rectal perforation difficult to exclude. No drainable fluid collection identified. 3. Gas within the wall of the bladder without bladder wall thickening or immediate surrounding inflammatory changes. The gas could be iatrogenic, although if catheterization has not been recently performed, a colovesical fistula would be a consideration. 4. No evidence of small-bowel inflammation or obstruction. 5. Bilateral pleural effusions with small pericardial effusion and bibasilar atelectasis. 6. These results will be called to the ordering clinician or representative by the Radiologist Assistant, and communication documented  in the PACS or [employer]. Electronically Signed   By: Richardean Sale M.D.   On: 01/21/2021 16:45   US RENAL  Result Date: 01/16/2021 CLINICAL DATA:  Acute kidney injury EXAM: RENAL / URINARY TRACT ULTRASOUND COMPLETE COMPARISON:  None. FINDINGS: Right Kidney: Renal measurements: 10.7 x 5.0 x 5.5 cm = volume: 156 mL. Echogenicity within normal limits. No mass or hydronephrosis visualized. Left Kidney: Renal measurements: 10.3 x 6.0 x 5.3 cm = volume: 172 mL. Echogenicity within normal limits. No mass or hydronephrosis visualized. Bladder: Appears normal for degree of bladder distention. Bilateral ureteral jets are not noted, and the patient is not able to void at the time of the examination. Other: None. IMPRESSION: 1. Unremarkable sonographic appearance of the kidneys. No hydronephrosis. 2. Bilateral ureteral jets are not noted, and the patient is not able to void at the time of the examination. Correlate for urinary retention. Electronically Signed   By: Eddie Candle M.D.   On: 01/16/2021 13:04   US Carotid Bilateral  Result Date: 01/16/2021 CLINICAL DATA:  Altered mental status, seizures EXAM: BILATERAL CAROTID DUPLEX ULTRASOUND TECHNIQUE: Pearline Cables scale imaging, color Doppler and duplex ultrasound were  performed of bilateral carotid and vertebral arteries in the neck. COMPARISON:  None. FINDINGS: Criteria: Quantification of carotid stenosis is based on velocity parameters that correlate the residual internal carotid diameter with NASCET-based stenosis levels, using the diameter of the distal internal carotid lumen as the denominator for stenosis measurement. The following velocity measurements were obtained: RIGHT ICA: 127/43 cm/sec CCA: 917/91 cm/sec SYSTOLIC ICA/CCA RATIO:  0.9 ECA:  118 cm/sec LEFT ICA: 145/41 cm/sec CCA: 505/69 cm/sec SYSTOLIC ICA/CCA RATIO:  1.1 ECA:  107 cm/sec RIGHT CAROTID ARTERY: No significant atherosclerotic plaque or evidence of stenosis in the internal carotid artery. RIGHT VERTEBRAL ARTERY:  Patent with normal antegrade flow. LEFT CAROTID ARTERY: Trace smooth heterogeneous atherosclerotic plaque in the proximal internal carotid artery. By peak systolic velocity criteria in the region of plaque, the estimated stenosis is less than 50%. LEFT VERTEBRAL ARTERY:  Patent with normal antegrade flow. IMPRESSION: 1. Mild (1-49%) stenosis proximal left internal carotid artery secondary to trace smooth heterogeneous atherosclerotic plaque. 2. No significant atherosclerotic plaque or evidence of stenosis in the right internal carotid artery. 3. Vertebral arteries are patent with normal antegrade flow. Signed, Criselda Peaches, MD, [facility] Electronically Signed   By: Jacqulynn Cadet M.D.   On: 01/16/2021 11:00   DG CHEST PORT 1 VIEW  Result Date: 01/17/2021 CLINICAL DATA:  Severe sepsis.  Patient is COVID-19 positive. EXAM: PORTABLE CHEST 1 VIEW COMPARISON:  January 16, 2021 FINDINGS: Heart, hila, mediastinum are normal. No pneumothorax. No nodules or masses. Minimal haziness in the left costophrenic angle, likely atelectasis. No suspicious infiltrates are identified. IMPRESSION: Minimal haziness in the left  costophrenic angle is favored to represent atelectasis. No other abnormalities are identified. No suspicious infiltrates. Electronically Signed   By: Dorise Bullion III M.D.   On: 01/17/2021 08:13   DG Chest Port 1 View  Result Date: 01/16/2021 CLINICAL DATA:  Seizure. EXAM: PORTABLE CHEST 1 VIEW COMPARISON:  May 09, 2016 FINDINGS: The heart size and mediastinal contours are within normal limits. Both lungs are clear. The visualized skeletal structures are unremarkable. IMPRESSION: No active disease. Electronically Signed   By: Virgina Norfolk M.D.   On: 01/16/2021 01:02   DG Foot 2 Views Right  Result Date: 01/19/2021 CLINICAL DATA:  Recent fall with bruising EXAM:  RIGHT FOOT - 2 VIEW COMPARISON:  None. FINDINGS: There is no evidence of fracture or dislocation. There is no evidence of arthropathy or other focal bone abnormality. Soft tissues are unremarkable. IMPRESSION: Negative. Electronically Signed   By: Jorje Guild M.D.   On: 01/19/2021 11:14   EEG adult  Result Date: 01/16/2021 Lora Havens, MD     01/16/2021 10:16 PM Patient Name: Krishawna Stiefel MRN: [id number] Epilepsy Attending: Lora Havens Referring Physician/Provider: Dr Clearence Ped, [place] B Date: 01/16/2021 Duration: 22.32 mins Patient history: 50 yo F with AMS and metabolic changes, Likely due to xanax withdrawal seizures. EEG to evaluate for seizure Level of alertness: Awake AEDs during EEG study: LEV, GBP Technical aspects: This EEG study was done with scalp electrodes positioned according to the 10-20 International system of electrode placement. Electrical activity was acquired at a sampling rate of 500Hz  and reviewed with a high frequency filter of 70Hz  and a low frequency filter of 1Hz . EEG data were recorded continuously and digitally stored. Description: The posterior dominant rhythm consists of 9 Hz activity of moderate voltage (25-35 uV) seen predominantly in posterior head regions, symmetric and reactive to eye  opening and eye closing. EEG showed intermittent generalized 5 to 6 Hz theta slowing. Hyperventilation and photic stimulation were not performed.   ABNORMALITY - Intermittent slow, generalized IMPRESSION: This study is suggestive of mild diffuse encephalopathy, nonspecific etiology. No seizures or epileptiform discharges were seen throughout the recording. Lora Havens   ECHOCARDIOGRAM COMPLETE  Result Date: 01/16/2021    ECHOCARDIOGRAM REPORT   Patient Name:   IYONNAH FERRANTE Date of Exam: 01/16/2021 Medical Rec #:  [id number]     Height:       66.0 in Accession #:    [id number]    Weight:       145.0 lb Date of Birth:  23-Oct-1970     BSA:          1.744 m Patient Age:    77 years      BP:           81/20 mmHg Patient Gender: F             HR:           111 bpm. Exam Location:  [name] Procedure: 2D Echo, Cardiac Doppler and Color Doppler Indications:    Syncope  History:        Patient has prior history of Echocardiogram examinations, most                 recent 07/11/2010. COPD, Signs/Symptoms:Dyspnea; Risk                 Factors:Tobacco abuse and Diabetes. COVID +.  Sonographer:    Wenda Low Referring Phys: ASIA B [place]  1. Left ventricular ejection fraction, by estimation, is 70 to 75%. The left ventricle has hyperdynamic function. The left ventricle has no regional wall motion abnormalities. There is mild left ventricular hypertrophy. Left ventricular diastolic parameters are indeterminate.  2. Right ventricular systolic function is normal. The right ventricular size is normal. Tricuspid regurgitation signal is inadequate for assessing PA pressure.  3. No right atrial or right ventricular collapse to suggest tamponade physiology. Respiratory variation in mitral outflow not assessed. Presence of sinus tachycardia does raise concern however for potential hemodynamic significance. Follow-up clinically  and suggest repeat limited echocardiogram within tht next 48 hours. Moderate  pericardial effusion. The pericardial effusion is circumferential.  4. The mitral valve  is grossly normal. Mild mitral valve regurgitation.  5. The aortic valve is tricuspid. Aortic valve regurgitation is not visualized. No aortic stenosis is present. Aortic valve mean gradient measures 6.0 mmHg.  6. The inferior vena cava is normal in size with <50% respiratory variability, suggesting right atrial pressure of 8 mmHg. Comparison(s): No prior Echocardiogram. FINDINGS  Left Ventricle: Left ventricular ejection fraction, by estimation, is 70 to 75%. The left ventricle has hyperdynamic function. The left ventricle has no regional wall motion abnormalities. The left ventricular internal cavity size was normal in size. There is mild left ventricular hypertrophy. Left ventricular diastolic parameters are indeterminate. Right Ventricle: The right ventricular size is normal. No increase in right ventricular wall thickness. Right ventricular systolic function is normal. Tricuspid regurgitation signal is inadequate for assessing PA pressure. Left Atrium: Left atrial size was normal in size. Right Atrium: Right atrial size was normal in size. Pericardium: No right atrial or right ventricular collapse to suggest tamponade physiology. Respiratory variation in mitral outflow not assessed. Presence of sinus tachycardia does raise concern however for potential hemodynamic significance. Follow-up clinically and suggest repeat limited echocardiogram within tht next 48 hours. A moderately sized pericardial effusion is present. The pericardial effusion is circumferential. Mitral Valve: The mitral valve is grossly normal. Mild mitral valve regurgitation. MV peak gradient, 13.1 mmHg. The mean mitral valve gradient is 6.0 mmHg. Tricuspid Valve: The tricuspid valve is grossly normal. Tricuspid valve regurgitation is trivial. Aortic Valve: The aortic valve is tricuspid. Aortic valve regurgitation is not visualized. No aortic stenosis is  present. Aortic valve mean gradient measures 6.0 mmHg. Aortic valve peak gradient measures 10.8 mmHg. Aortic valve area, by VTI measures 2.42  cm. Pulmonic Valve: The pulmonic valve was grossly normal. Pulmonic valve regurgitation is trivial. Aorta: The aortic root is normal in size and structure. Venous: The inferior vena cava is normal in size with less than 50% respiratory variability, suggesting right atrial pressure of 8 mmHg. IAS/Shunts: No atrial level shunt detected by color flow Doppler.  LEFT VENTRICLE PLAX 2D LVIDd:         3.70 cm  Diastology LVIDs:         2.40 cm  LV e' medial:    9.17 cm/s LV PW:         1.10 cm  LV E/e' medial:  11.2 LV IVS:        1.10 cm  LV e' lateral:   9.17 cm/s LVOT diam:     2.00 cm  LV E/e' lateral: 11.2 LV SV:         58 LV SV Index:   33 LVOT Area:     3.14 cm  RIGHT VENTRICLE RV Basal diam:  2.30 cm RV Mid diam:    2.00 cm RV S prime:     27.70 cm/s TAPSE (M-mode): 3.3 cm LEFT ATRIUM             Index       RIGHT ATRIUM           Index LA diam:        3.30 cm 1.89 cm/m  RA Area:     10.80 cm LA Vol (A2C):   41.8 ml 23.96 ml/m RA Volume:   17.70 ml  10.15 ml/m LA Vol (A4C):   34.7 ml 19.89 ml/m LA Biplane Vol: 38.5 ml 22.07 ml/m  AORTIC VALVE AV Area (Vmax):    2.47 cm AV Area (Vmean):   2.20 cm AV Area (VTI):  2.42 cm AV Vmax:           164.00 cm/s AV Vmean:          109.000 cm/s AV VTI:            0.240 m AV Peak Grad:      10.8 mmHg AV Mean Grad:      6.0 mmHg LVOT Vmax:         129.00 cm/s LVOT Vmean:        76.400 cm/s LVOT VTI:          0.185 m LVOT/AV VTI ratio: 0.77  AORTA Ao Root diam: 3.20 cm MITRAL VALVE MV Area (PHT): 5.02 cm     SHUNTS MV Area VTI:   1.73 cm     Systemic VTI:  0.18 m MV Peak grad:  13.1 mmHg    Systemic Diam: 2.00 cm MV Mean grad:  6.0 mmHg MV Vmax:       1.81 m/s MV Vmean:      109.0 cm/s MV Decel Time: 151 msec MV E velocity: 103.00 cm/s MV A velocity: 168.00 cm/s MV E/A ratio:  0.61 Rozann Lesches MD Electronically signed by  Rozann Lesches MD Signature Date/Time: 01/16/2021/4:08:15 PM    Final    ECHOCARDIOGRAM LIMITED  Result Date: 01/22/2021    ECHOCARDIOGRAM LIMITED REPORT   Patient Name:   SOFIE SCHENDEL Date of Exam: 01/22/2021 Medical Rec #:  [id number]     Height:       66.0 in Accession #:    [id number]    Weight:       161.6 lb Date of Birth:  04-27-1971     BSA:          1.827 m Patient Age:    12 years      BP:           107/64 mmHg Patient Gender: F             HR:           88 bpm. Exam Location:  [name] Procedure: Limited Echo Indications:    Pericardial effusion  History:        Patient has prior history of Echocardiogram examinations, most                 recent 01/19/2021. COPD, Arrythmias:PVC, Signs/Symptoms:Chest                 Pain; Risk Factors:Diabetes. COVID +, Tobacco abuse, Sepsis.  Sonographer:    Wenda Low Referring Phys: [phone] Reggie Bise IMPRESSIONS  1. Left ventricular ejection fraction, by estimation, is 60 to 65%. The left ventricle has normal function.  2. Moderate pericardial effusion. The pericardial effusion is circumferential. There is no evidence of cardiac tamponade. Effusion is stable compared to the 01/19/21 study  3. The inferior vena cava is normal in size with greater than 50% respiratory variability, suggesting right atrial pressure of 3 mmHg.  4. Limited echo to evaluate pericardial effusion FINDINGS  Left Ventricle: Left ventricular ejection fraction, by estimation, is 60 to 65%. The left ventricle has normal function. Pericardium: A moderately sized pericardial effusion is present. The pericardial effusion is circumferential. There is no evidence of cardiac tamponade. Venous: The inferior vena cava is normal in size with greater than 50% respiratory variability, suggesting right atrial pressure of 3 mmHg. LEFT VENTRICLE PLAX 2D LVIDd:         3.20 cm LVIDs:  2.40 cm LV PW:         1.20 cm LV IVS:        0.90 cm LVOT diam:     2.10 cm LVOT Area:     3.46 cm  LEFT ATRIUM          Index LA diam:    3.20 cm 1.75 cm/m   AORTA Ao Root diam: 3.40 cm  SHUNTS Systemic Diam: 2.10 cm Carlyle Dolly MD Electronically signed by Carlyle Dolly MD Signature Date/Time: 01/22/2021/11:50:46 AM    Final    ECHOCARDIOGRAM LIMITED  Result Date: 01/19/2021    ECHOCARDIOGRAM LIMITED REPORT   Patient Name:   [facility] Date of Exam: 01/19/2021 Medical Rec #:  [id number]     Height:       66.0 in Accession #:    [id number]    Weight:       161.6 lb Date of Birth:  [birth year]     BSA:          1.827 m Patient Age:    1 years      BP:           99/68 mmHg Patient Gender: F             HR:           96 bpm. Exam Location:  [name] Procedure: Limited Echo Indications:    Pericardial effusion  History:        Patient has prior history of Echocardiogram examinations, most                 recent 01/16/2021. COPD, Arrythmias:PVC, Signs/Symptoms:Chest                 Pain; Risk Factors:Diabetes. COVID +, Tobacco Abuse, Sepsis,                 Pericardial effusion.  Sonographer:    Wenda Low Referring Phys: [place]  1. Left ventricular ejection fraction, by estimation, is 60 to 65%. The left ventricle has normal function.  2. Moderate pericardial effusion. The pericardial effusion is circumferential. There is no evidence of cardiac tamponade. Effusion looks larger compared to the 01/16/2021 study however remains overall moderate by measurements. Recommend repeating limited  study in 72 hours. FINDINGS  Left Ventricle: Left ventricular ejection fraction, by estimation, is 60 to 65%. The left ventricle has normal function. Pericardium: A moderately sized pericardial effusion is present. The pericardial effusion is circumferential. There is no evidence of cardiac tamponade. LEFT VENTRICLE PLAX 2D LVIDd:         3.90 cm LVIDs:         2.40 cm LV PW:         1.00 cm LV IVS:        1.00 cm LVOT diam:     2.00 cm LVOT Area:     3.14 cm  LEFT ATRIUM         Index LA diam:    3.40 cm  1.86 cm/m   SHUNTS Systemic Diam: 2.00 cm Carlyle Dolly MD Electronically signed by Carlyle Dolly MD Signature Date/Time: 01/19/2021/11:20:45 AM    Final    [place] EKG SITE RITE  Result Date: 01/16/2021 If Site Rite image not attached, placement could not be confirmed due to current cardiac rhythm.   Orson Eva, DO  [facility]  If 7PM-7AM, please contact night-coverage [email or web address] Password [email or web address] 02/08/2021, 11:18 AM   LOS: 5 days

## 2021-02-08 NOTE — Progress Notes (Signed)
Pt stated she had "1/4 cup of blood" in her stool at bedtime last night.

## 2021-02-09 ENCOUNTER — Encounter (HOSPITAL_COMMUNITY): Payer: Self-pay | Admitting: Internal Medicine

## 2021-02-09 DIAGNOSIS — K529 Noninfective gastroenteritis and colitis, unspecified: Secondary | ICD-10-CM | POA: Diagnosis not present

## 2021-02-09 DIAGNOSIS — U071 COVID-19: Secondary | ICD-10-CM | POA: Diagnosis not present

## 2021-02-09 DIAGNOSIS — K921 Melena: Secondary | ICD-10-CM | POA: Diagnosis not present

## 2021-02-09 DIAGNOSIS — K56699 Other intestinal obstruction unspecified as to partial versus complete obstruction: Secondary | ICD-10-CM | POA: Diagnosis not present

## 2021-02-09 LAB — CBC
HCT: 32.2 % — ABNORMAL LOW (ref 36.0–46.0)
Hemoglobin: 10.1 g/dL — ABNORMAL LOW (ref 12.0–15.0)
MCH: 29.6 pg (ref 26.0–34.0)
MCHC: 31.4 g/dL (ref 30.0–36.0)
MCV: 94.4 fL (ref 80.0–100.0)
Platelets: 310 10*3/uL (ref 150–400)
RBC: 3.41 MIL/uL — ABNORMAL LOW (ref 3.87–5.11)
RDW: 13.5 % (ref 11.5–15.5)
WBC: 5.2 10*3/uL (ref 4.0–10.5)
nRBC: 0 % (ref 0.0–0.2)

## 2021-02-09 LAB — SURGICAL PATHOLOGY

## 2021-02-09 LAB — C-REACTIVE PROTEIN: CRP: <0.5 mg/dL (ref <1.0)

## 2021-02-09 LAB — MAGNESIUM: Magnesium: 1.7 mg/dL (ref 1.7–2.4)

## 2021-02-09 LAB — SEDIMENTATION RATE: Sed Rate: 36 mm/hr — ABNORMAL HIGH (ref 0–22)

## 2021-02-09 LAB — HEPATITIS B SURFACE ANTIGEN: Hepatitis B Surface Ag: NONREACTIVE

## 2021-02-09 LAB — HEPATITIS B CORE ANTIBODY, TOTAL: Hep B Core Total Ab: NONREACTIVE

## 2021-02-09 NOTE — Progress Notes (Signed)
    Subjective: Constant lower abdominal pain. Diarrhea, reporting 23 stools yesterday and 6 this morning. Associated rectal bleeding. Feels nauseated.   Objective: Vital signs in last 24 hours: Temp:  [97.8 F (36.6 C)-99.1 F (37.3 C)] 97.8 F (36.6 C) (10/10 0515) Pulse Rate:  [88-100] 88 (10/10 0515) Resp:  [16-18] 16 (10/10 0515) BP: (125-130)/(66-76) 125/66 (10/10 0515) SpO2:  [95 %-99 %] 95 % (10/10 0515) Last BM Date: 02/06/21 General:   Alert and oriented, pleasant, uncomfortable Head:  Normocephalic and atraumatic. Abdomen:  Bowel sounds present, soft, TTP lower abdomen without rebound Msk:  Symmetrical without gross deformities. Normal posture. Extremities:  Without edema. Neurologic:  Alert and  oriented x4 Psych:  Normal mood and affect.  Intake/Output from previous day: 10/09 0701 - 10/10 0700 In: 840 [P.O.:840] Out: -  Intake/Output this shift: No intake/output data recorded.  Lab Results: Recent Labs    02/08/21 0451 02/09/21 0512  WBC 5.0 5.2  HGB 10.6* 10.1*  HCT 33.1* 32.2*  PLT 406* 310   Lab Results  Component Value Date   CREATININE 0.77 02/06/2021   BUN 5 (L) 02/06/2021   NA 141 02/06/2021   K 3.7 02/06/2021   CL 108 02/06/2021   CO2 27 02/06/2021     Assessment: 50 year old female with history of Crohn's disease diagnosed in 1998 at [facility] and without therapy as multiple colonoscopies and imaging since then without active disease, presenting with rectal bleeding this admission. CT abd/pelvis with contrast noting slightly improved but persistent distal colitis on CT (compared to Sept 2022 imaging). Flex sig completed this admission with diffues severe inflammation involving rectum and sigmoid secondary to proctosigmoid colitis and sigmoid stricture. Pathology remains pending. Cdiff and GI pathogen panel negative.   Suspected IBD and awaiting pathology results. IV steroids continue while inpatient and will need oral prednisone at  discharge for extended course. Recommending repeat flex sig in 3-4 weeks.   Clinically, she has not had significant improvement. Continues with abdominal pain and persistent loose stools.      Plan: Repeat flex sig in 3-4 weeks.  Will need Surgical evaluation if no improvement  Continue IV steroids for now  Will need oral prednisone at discharge with extended course  Will go ahead and check TB gold assay and Hep B serologies in anticipation of therapy as outpatient  Further recommendations pending pathology results   Annitta Needs, PhD, ANP-BC [facility]     LOS: 6 days    02/09/2021, 7:55 AM

## 2021-02-09 NOTE — Progress Notes (Addendum)
  Discussed with Dr. Constance Haw.  Will probably be best served at tertiary care facility. I have placed a call to [facility] [phone]). Several day waiting period per intake nurse. She has asked for my information and will return my call shortly.

## 2021-02-09 NOTE — Progress Notes (Addendum)
PROGRESS NOTE  [name] NKN:[id number] DOB: [birth year] DOA: 02/03/2021 PCP: Teodoro Kil, PA-C   Brief History:    50 y.o. female, with history of COPD, questionable Crohn's, GERD, HTN, T2DM, xanax withdrawal seizure, tobacco abuse presenting with 1 week history of hematochezia and intermittent nausea and vomiting.  The patient was recent admitted to the hospital from 01/16/2021 to 01/24/2021 secondary to sepsis due to colitis.  The patient was seen by GI and it was felt that the patient could be treated with antibiotics.  She was discharged home with 5 additional days of Cipro and Flagyl.  Any procedures were felt to be able to be deferred into the outpatient setting.  The patient states that she was noncompliant with her antibiotics.  She only took 1 day of her antibiotics after discharge which caused her to have nausea and vomiting.  As result, she stopped taking the antibiotics.  During her last hospitalization, the patient was also treated for Xanax withdrawal seizures as well as a COVID-19 infection for which she was largely asymptomatic. She denies any fevers, chills, chest pain, shortness breath, headache, neck pain, coughing, hemoptysis.  She continues to smoke occasionally. In the emergency department, the patient was afebrile hemodynamically stable with oxygen saturation 100% room air.  WBC 8.8, hemoglobin 13.4, platelets 729K.  Sodium 136, potassium 3.7, BUN less than 5, creatinine 0.86.  CT of the abdomen and pelvis showed slightly improved but persistent bowel wall thickening and pericolonic stranding involving the descending and rectosigmoid colon.  She was started on ceftriaxone and metronidazole.  GI was consulted to assist.  She underwent flex sig on 02/06/21 which suggested IBD.  Antibiotics were discontinued and she was started on IV solumedrol.   10/9--abdominal pain a little worse than 10/8 but still better than admission day.  Had "1/4 cup" blood per rectum last  night.  Tolerating diet. Hgb remains stable   Assessment/Plan: Hematochezia/colitis -Likely secondary to the patient's colitis -She was seen by GI during her last hospital admission who felt this was more likely to be infectious rather than IBD. -initially started ceftriaxone and metronidazole -she was tolerating ceftriaxone without difficulty -Consult GI>>planning colonoscopy -C. difficile negative -Stool pathogen panel--neg -10/7 flex sig--Diffuse severe inflammation characterized by erosions, erythema and shallow ulcerations was found in the rectum and in the sigmoid colon extending up to approx 15 cm from the anal verge -10/7 discussed with GI, Dr. Carver--d/c abx, start IV solumedrol for presumptive IBD flare -continue IV steroids>>BM's decreasing -10/10--discussed case with [facility]--Dr. Atilano Ina transfer; recommends referral to outpt multidisciplinary IBD clinic [phone] -CRP <0.5   Hypokalemia/hypomagnesemia -Repleted   Anxiety -Restart Xanax home dose -PDMP reviewed shows that the patient received Xanax 1 mg, #90 monthly   Pericardial effusion -01/22/2021 echo EF 60 to 65%, moderate pericardial effusion unchanged from 01/19/2021 -Patient is hemodynamically stable   COPD -Stable on room air   Tobacco abuse -Tobacco cessation discussed   THC abuse -cessation discussed   Personal History of PCN Allergy -patient has tolerated ceftriaxone without difficulty this admission             Status is: Inpatient   Remains inpatient appropriate because:Inpatient level of care appropriate due to severity of illness   Dispo: The patient is from: Home              Anticipated d/c is to: Home              Patient currently  is not medically stable to d/c.              Difficult to place patient No               Family Communication:  significant other at bedside 10/10   Consultants:  GI   Code Status:  FULL    DVT Prophylaxis:  SCDs      Procedures: As Listed in Progress Note Above   Antibiotics: Ceftriaxone 10/5>>10/7 Metronidazole 10/4>>10/7  Total time spent 35 minutes.  Greater than 50% spent face to face counseling and coordinating care.   Subjective: Patient states number of BMs decreasing.  Abdominal pain is improving.  Denies f/c, cp, sob, n/v  Objective: Vitals:   02/08/21 1325 02/08/21 2029 02/09/21 0515 02/09/21 1324  BP: 130/76 129/68 125/66 129/68  Pulse: 100 92 88 83  Resp: 18  16 16   Temp: 99.1 F (37.3 C) 97.9 F (36.6 C) 97.8 F (36.6 C) (!) 97.4 F (36.3 C)  TempSrc: Oral Oral  Oral  SpO2: 99% 97% 95% 98%  Weight:      Height:        Intake/Output Summary (Last 24 hours) at 02/09/2021 1820 Last data filed at 02/09/2021 1300 Gross per 24 hour  Intake 560 ml  Output 750 ml  Net -190 ml   Weight change:  Exam:  General:  Pt is alert, follows commands appropriately, not in acute distress HEENT: No icterus, No thrush, No neck mass, [place]/AT Cardiovascular: RRR, S1/S2, no rubs, no gallops Respiratory: CTA bilaterally, no wheezing, no crackles, no rhonchi Abdomen: Soft/+BS, LLQ tender, non distended, no guarding Extremities: No edema, No lymphangitis, No petechiae, No rashes, no synovitis   Data Reviewed: I have personally reviewed following labs and imaging studies Basic Metabolic Panel: Recent Labs  Lab 02/03/21 1206 02/03/21 1304 02/03/21 1635 02/04/21 0455 02/05/21 0513 02/06/21 0533  NA 133* 135  --  136 139 141  K 3.2* 3.2*  --  3.7 3.5 3.7  CL 96* 96*  --  104 106 108  CO2 27  --   --  27 27 27   GLUCOSE 118* 120*  --  111* 97 89  BUN 8 7  --  <5* 6 5*  CREATININE 0.86 0.70  --  0.77 0.90 0.77  CALCIUM 9.5  --   --  8.2* 8.3* 9.0  MG  --   --  1.6*  --  2.0 2.0   Liver Function Tests: Recent Labs  Lab 02/03/21 1206  AST 41  ALT 29  ALKPHOS 121  BILITOT 0.4  PROT 7.6  ALBUMIN 3.7   No results for input(s): LIPASE, AMYLASE in the last 168 hours. No results  for input(s): AMMONIA in the last 168 hours. Coagulation Profile: No results for input(s): INR, PROTIME in the last 168 hours. CBC: Recent Labs  Lab 02/04/21 0455 02/05/21 0513 02/06/21 0533 02/08/21 0451 02/09/21 0512  WBC 4.3 5.0 4.1 5.0 5.2  HGB 10.7* 10.7* 11.4* 10.6* 10.1*  HCT 34.2* 33.6* 35.8* 33.1* 32.2*  MCV 95.5 96.3 96.5 94.6 94.4  PLT 486* 507* 450* 406* 310   Cardiac Enzymes: No results for input(s): CKTOTAL, CKMB, CKMBINDEX, TROPONINI in the last 168 hours. BNP: Invalid input(s): POCBNP CBG: Recent Labs  Lab 02/05/21 0748 02/05/21 1209 02/05/21 1636 02/06/21 1042 02/06/21 2121  GLUCAP 98 78 80 81 105*   HbA1C: No results for input(s): HGBA1C in the last 72 hours. Urine analysis:    Component Value  Date/Time   COLORURINE YELLOW 02/04/2021 0956   APPEARANCEUR CLEAR 02/04/2021 0956   LABSPEC 1.005 02/04/2021 0956   PHURINE 6.0 02/04/2021 0956   GLUCOSEU NEGATIVE 02/04/2021 0956   GLUCOSEU NEG mg/dL 12/24/2009 2317   HGBUR MODERATE (A) 02/04/2021 0956   BILIRUBINUR NEGATIVE 02/04/2021 0956   KETONESUR NEGATIVE 02/04/2021 0956   PROTEINUR 30 (A) 02/04/2021 0956   UROBILINOGEN 0.2 12/26/2013 1530   NITRITE NEGATIVE 02/04/2021 0956   LEUKOCYTESUR MODERATE (A) 02/04/2021 0956   Sepsis Labs: @LABRCNTIP (procalcitonin:4,lacticidven:4) ) Recent Results (from the past 240 hour(s))  C Difficile Quick Screen w PCR reflex     Status: None   Collection Time: 02/03/21  9:49 PM   Specimen: STOOL  Result Value Ref Range Status   C Diff antigen NEGATIVE NEGATIVE Final   C Diff toxin NEGATIVE NEGATIVE Final   C Diff interpretation No C. difficile detected.  Final    Comment: Performed at [facility], [address] [place], [place] [address]  Gastrointestinal Panel by PCR , Stool     Status: None   Collection Time: 02/03/21  9:49 PM   Specimen: STOOL  Result Value Ref Range Status   Campylobacter species NOT DETECTED NOT DETECTED Final   Plesimonas  shigelloides NOT DETECTED NOT DETECTED Final   Salmonella species NOT DETECTED NOT DETECTED Final   Yersinia enterocolitica NOT DETECTED NOT DETECTED Final   Vibrio species NOT DETECTED NOT DETECTED Final   Vibrio cholerae NOT DETECTED NOT DETECTED Final   Enteroaggregative E coli (EAEC) NOT DETECTED NOT DETECTED Final   Enteropathogenic E coli (EPEC) NOT DETECTED NOT DETECTED Final   Enterotoxigenic E coli (ETEC) NOT DETECTED NOT DETECTED Final   Shiga like toxin producing E coli (STEC) NOT DETECTED NOT DETECTED Final   Shigella/Enteroinvasive E coli (EIEC) NOT DETECTED NOT DETECTED Final   Cryptosporidium NOT DETECTED NOT DETECTED Final   Cyclospora cayetanensis NOT DETECTED NOT DETECTED Final   Entamoeba histolytica NOT DETECTED NOT DETECTED Final   Giardia lamblia NOT DETECTED NOT DETECTED Final   Adenovirus F40/41 NOT DETECTED NOT DETECTED Final   Astrovirus NOT DETECTED NOT DETECTED Final   Norovirus GI/GII NOT DETECTED NOT DETECTED Final   Rotavirus A NOT DETECTED NOT DETECTED Final   Sapovirus (I, II, IV, and V) NOT DETECTED NOT DETECTED Final    Comment: Performed at [facility], [address], [place], [place] [address]  Urine Culture     Status: None   Collection Time: 02/04/21  9:56 AM   Specimen: Urine, Clean Catch  Result Value Ref Range Status   Specimen Description   Final    URINE, CLEAN CATCH Performed at [facility], [address], [place], [place] [address]    Special Requests   Final    NONE Performed at [facility], [address], [place], [place] [address]    Culture   Final    NO GROWTH Performed at [facility], [address] [place], [place] [address]    Report Status 02/05/2021 FINAL  Final  SARS Coronavirus 2 by RT PCR (hospital order, performed in [facility] hospital lab) Nasopharyngeal Nasopharyngeal Swab     Status: None   Collection Time: 02/06/21  6:31 AM   Specimen: Nasopharyngeal Swab  Result Value Ref Range Status    SARS Coronavirus 2 NEGATIVE NEGATIVE Final    Comment: (NOTE) SARS-CoV-2 target nucleic acids are NOT DETECTED.  The SARS-CoV-2 RNA is generally detectable in upper and lower respiratory specimens  during the acute phase of infection. The lowest concentration of SARS-CoV-2 viral copies this assay can detect is 250 copies / mL. A negative result does not preclude SARS-CoV-2 infection and should not be used as the sole basis for treatment or other patient management decisions.  A negative result may occur with improper specimen collection / handling, submission of specimen other than nasopharyngeal swab, presence of viral mutation(s) within the areas targeted by this assay, and inadequate number of viral copies (<250 copies / mL). A negative result must be combined with clinical observations, patient history, and epidemiological information.  Fact Sheet for Patients:   StrictlyIdeas.no  Fact Sheet for Healthcare Providers: BankingDealers.co.za  This test is not yet approved or  cleared by the [place] FDA and has been authorized for detection and/or diagnosis of SARS-CoV-2 by FDA under an Emergency Use Authorization (EUA).  This EUA will remain in effect (meaning this test can be used) for the duration of the COVID-19 declaration under Section 564(b)(1) of the Act, 21 U.S.C. section 360bbb-3(b)(1), unless the authorization is terminated or revoked sooner.  Performed at [facility], [address] [place], [place] [address]      Scheduled Meds:  ALPRAZolam  1 mg Oral TID   hydrocortisone   Rectal BID   levETIRAcetam  750 mg Oral BID   methylPREDNISolone (SOLU-MEDROL) injection  60 mg Intravenous Daily   pantoprazole  40 mg Oral Daily   Continuous Infusions:  Procedures/Studies: CT HEAD WO CONTRAST (5MM)  Result Date: 01/16/2021 CLINICAL DATA:  Status post seizure. EXAM: CT HEAD WITHOUT CONTRAST TECHNIQUE: Contiguous axial  images were obtained from the base of the skull through the vertex without intravenous contrast. COMPARISON:  June 03, 2020 FINDINGS: Brain: No evidence of acute infarction, hemorrhage, hydrocephalus, extra-axial collection or mass lesion/mass effect. Vascular: No hyperdense vessel or unexpected calcification. Skull: Normal. Negative for fracture or focal lesion. Sinuses/Orbits: A small right maxillary sinus air-fluid level is seen. Other: None. IMPRESSION: 1. No acute intracranial abnormality. Electronically Signed   By: Virgina Norfolk M.D.   On: 01/16/2021 01:08   CT ABDOMEN PELVIS W CONTRAST  Result Date: 02/03/2021 CLINICAL DATA:  Abdominal abscess/infection suspected. Rectal bleeding for 7 days. Denies pain. History of COPD, HTN, DM, hyster, GB, oophorectomy EXAM: CT ABDOMEN AND PELVIS WITH CONTRAST TECHNIQUE: Multidetector CT imaging of the abdomen and pelvis was performed using the standard protocol following bolus administration of intravenous contrast. CONTRAST:  158m OMNIPAQUE IOHEXOL 300 MG/ML  SOLN COMPARISON:  CT abdomen pelvis 01/21/2021 FINDINGS: Lower chest: Partially visualized slightly improved trace pericardial effusion. Resolution of bilateral pleural effusions. No acute abnormality. Hepatobiliary: Focally hypodensity of the hepatic parenchyma along the falciform ligament likely represents focal fatty infiltration. No focal liver abnormality. Status post cholecystectomy. No biliary dilatation. Pancreas: No focal lesion. Normal pancreatic contour. No surrounding inflammatory changes. No main pancreatic ductal dilatation. Spleen: Normal in size without focal abnormality. Adrenals/Urinary Tract: No adrenal nodule bilaterally. Bilateral kidneys enhance symmetrically. No hydronephrosis. No hydroureter. The urinary bladder is unremarkable. Resolution of gas associated with the urinary bladder. On delayed imaging, there is no urothelial wall thickening and there are no filling defects in the  opacified portions of the bilateral collecting systems or ureters. Stomach/Bowel: Stomach is within normal limits. No evidence of small bowel wall thickening or dilatation. Slightly improved but persistent bowel wall thickening, submucosal hyperemia, pericolonic fat stranding of the descending colon and rectosigmoid colon. Appendix appears normal. Vascular/Lymphatic: No abdominal aorta or iliac aneurysm. Mild noncalcified atherosclerotic  plaque of the aorta and its branches. No abdominal, pelvic, or inguinal lymphadenopathy. Reproductive: Status post hysterectomy. No adnexal masses. Other: No intraperitoneal free fluid. No intraperitoneal free gas. No organized fluid collection. Musculoskeletal: No abdominal wall hernia or abnormality. No suspicious lytic or blastic osseous lesions. No acute displaced fracture. Multilevel degenerative changes of the spine. IMPRESSION: 1. Slightly improved but persistent distal colitis. 2. Partially visualized slightly improved trace pericardial effusion. Resolution of bilateral pleural effusions. 3.  Aortic Atherosclerosis (ICD10-I70.0). Electronically Signed   By: Iven Finn M.D.   On: 02/03/2021 15:12   CT ABDOMEN PELVIS W CONTRAST  Result Date: 01/21/2021 CLINICAL DATA:  Abdominal pain, acute nonlocalized fever, leukocytosis. Abdominal abscess/infection suspected. COVID positive. History of COPD, collagen vascular disease and Crohn's disease. EXAM: CT ABDOMEN AND PELVIS WITH CONTRAST TECHNIQUE: Multidetector CT imaging of the abdomen and pelvis was performed using the standard protocol following bolus administration of intravenous contrast. CONTRAST:  69m OMNIPAQUE IOHEXOL 350 MG/ML SOLN COMPARISON:  CT 09/14/2015. FINDINGS: Lower chest: New small to moderate dependent pleural effusions with associated bibasilar atelectasis at both lung bases. There is a small pericardial effusion as well. The heart size is normal. No acute vascular findings are evident. Hepatobiliary:  The liver is normal in density without suspicious focal abnormality. No significant biliary dilatation status post cholecystectomy. Pancreas: Unremarkable. No pancreatic ductal dilatation or surrounding inflammatory changes. Spleen: Normal in size without focal abnormality. Adrenals/Urinary Tract: Both adrenal glands appear normal. Both kidneys appear normal. No evidence of urinary tract calculus, hydronephrosis or perinephric soft tissue stranding. The urinary bladder is moderately distended. There is a small amount of air within the bladder lumen. No bladder wall thickening or immediate surrounding inflammatory changes identified. Stomach/Bowel: Enteric contrast was administered and has passed into the mid small bowel. The stomach appears normal for its degree of distention. There is no significant small bowel distension, wall thickening or surrounding inflammation. By report, the patient is status post appendectomy. The proximal colon is fluid-filled and mildly distended. There is diffuse wall thickening of the colon extending from the splenic flexure to the rectum. Wall thickening and enhancement are most prominent within the proximal rectum. Distally, the rectal walls are less well-defined, and distal rectal perforation is difficult to exclude. There is moderate perirectal soft tissue stranding. These inflammatory changes do not appear to directly abut the posterior wall of the bladder. No discrete fistula are seen. Vascular/Lymphatic: There are no enlarged abdominal or pelvic lymph nodes. No acute vascular findings. The portal, superior mesenteric and splenic veins are patent. Reproductive: Hysterectomy. No adnexal mass. There is no gas within the vagina. Other: Small amount of pelvic ascites. As above, possible perirectal fluid without drainable fluid collection or fistula. No other extraluminal fluid collections are identified. There is no free air. Musculoskeletal: No acute or significant osseous findings.  No evidence of discitis or sacroiliitis. IMPRESSION: 1. Findings are consistent with distal colitis, extending from the splenic flexure of the colon into the rectum. This colitis is presumably secondary to the patient's Crohn's disease. 2. The walls of the distal rectum are ill-defined, and rectal perforation difficult to exclude. No drainable fluid collection identified. 3. Gas within the wall of the bladder without bladder wall thickening or immediate surrounding inflammatory changes. The gas could be iatrogenic, although if catheterization has not been recently performed, a colovesical fistula would be a consideration. 4. No evidence of small-bowel inflammation or obstruction. 5. Bilateral pleural effusions with small pericardial effusion and bibasilar atelectasis. 6. These results will  be called to the ordering clinician or representative by the Radiologist Assistant, and communication documented in the PACS or [employer]. Electronically Signed   By: Richardean Sale M.D.   On: 01/21/2021 16:45   US RENAL  Result Date: 01/16/2021 CLINICAL DATA:  Acute kidney injury EXAM: RENAL / URINARY TRACT ULTRASOUND COMPLETE COMPARISON:  None. FINDINGS: Right Kidney: Renal measurements: 10.7 x 5.0 x 5.5 cm = volume: 156 mL. Echogenicity within normal limits. No mass or hydronephrosis visualized. Left Kidney: Renal measurements: 10.3 x 6.0 x 5.3 cm = volume: 172 mL. Echogenicity within normal limits. No mass or hydronephrosis visualized. Bladder: Appears normal for degree of bladder distention. Bilateral ureteral jets are not noted, and the patient is not able to void at the time of the examination. Other: None. IMPRESSION: 1. Unremarkable sonographic appearance of the kidneys. No hydronephrosis. 2. Bilateral ureteral jets are not noted, and the patient is not able to void at the time of the examination. Correlate for urinary retention. Electronically Signed   By: Eddie Candle M.D.   On: 01/16/2021 13:04   US  Carotid Bilateral  Result Date: 01/16/2021 CLINICAL DATA:  Altered mental status, seizures EXAM: BILATERAL CAROTID DUPLEX ULTRASOUND TECHNIQUE: Pearline Cables scale imaging, color Doppler and duplex ultrasound were performed of bilateral carotid and vertebral arteries in the neck. COMPARISON:  None. FINDINGS: Criteria: Quantification of carotid stenosis is based on velocity parameters that correlate the residual internal carotid diameter with NASCET-based stenosis levels, using the diameter of the distal internal carotid lumen as the denominator for stenosis measurement. The following velocity measurements were obtained: RIGHT ICA: 127/43 cm/sec CCA: 983/38 cm/sec SYSTOLIC ICA/CCA RATIO:  0.9 ECA:  118 cm/sec LEFT ICA: 145/41 cm/sec CCA: 250/53 cm/sec SYSTOLIC ICA/CCA RATIO:  1.1 ECA:  107 cm/sec RIGHT CAROTID ARTERY: No significant atherosclerotic plaque or evidence of stenosis in the internal carotid artery. RIGHT VERTEBRAL ARTERY:  Patent with normal antegrade flow. LEFT CAROTID ARTERY: Trace smooth heterogeneous atherosclerotic plaque in the proximal internal carotid artery. By peak systolic velocity criteria in the region of plaque, the estimated stenosis is less than 50%. LEFT VERTEBRAL ARTERY:  Patent with normal antegrade flow. IMPRESSION: 1. Mild (1-49%) stenosis proximal left internal carotid artery secondary to trace smooth heterogeneous atherosclerotic plaque. 2. No significant atherosclerotic plaque or evidence of stenosis in the right internal carotid artery. 3. Vertebral arteries are patent with normal antegrade flow. Signed, Criselda Peaches, MD, [facility] Electronically Signed   By: Jacqulynn Cadet M.D.   On: 01/16/2021 11:00   DG CHEST PORT 1 VIEW  Result Date: 01/17/2021 CLINICAL DATA:  Severe sepsis.  Patient is COVID-19 positive. EXAM: PORTABLE CHEST 1 VIEW COMPARISON:  January 16, 2021 FINDINGS: Heart, hila, mediastinum are  normal. No pneumothorax. No nodules or masses. Minimal haziness in the left costophrenic angle, likely atelectasis. No suspicious infiltrates are identified. IMPRESSION: Minimal haziness in the left costophrenic angle is favored to represent atelectasis. No other abnormalities are identified. No suspicious infiltrates. Electronically Signed   By: Dorise Bullion III M.D.   On: 01/17/2021 08:13   DG Chest Port 1 View  Result Date: 01/16/2021 CLINICAL DATA:  Seizure. EXAM: PORTABLE CHEST 1 VIEW COMPARISON:  May 09, 2016 FINDINGS: The heart size and mediastinal contours are within normal limits. Both lungs are clear. The visualized skeletal structures are unremarkable. IMPRESSION: No active disease. Electronically Signed   By: Virgina Norfolk M.D.   On: 01/16/2021 01:02   DG Foot  2 Views Right  Result Date: 01/19/2021 CLINICAL DATA:  Recent fall with bruising EXAM: RIGHT FOOT - 2 VIEW COMPARISON:  None. FINDINGS: There is no evidence of fracture or dislocation. There is no evidence of arthropathy or other focal bone abnormality. Soft tissues are unremarkable. IMPRESSION: Negative. Electronically Signed   By: Jorje Guild M.D.   On: 01/19/2021 11:14   EEG adult  Result Date: 01/16/2021 Lora Havens, MD     01/16/2021 10:16 PM Patient Name: Glendola Friedhoff MRN: [id number] Epilepsy Attending: Lora Havens Referring Physician/Provider: Dr Clearence Ped, [place] B Date: 01/16/2021 Duration: 22.32 mins Patient history: 50 yo F with AMS and metabolic changes, Likely due to xanax withdrawal seizures. EEG to evaluate for seizure Level of alertness: Awake AEDs during EEG study: LEV, GBP Technical aspects: This EEG study was done with scalp electrodes positioned according to the 10-20 International system of electrode placement. Electrical activity was acquired at a sampling rate of 500Hz  and reviewed with a high frequency filter of 70Hz  and a low frequency filter of 1Hz . EEG data were recorded continuously  and digitally stored. Description: The posterior dominant rhythm consists of 9 Hz activity of moderate voltage (25-35 uV) seen predominantly in posterior head regions, symmetric and reactive to eye opening and eye closing. EEG showed intermittent generalized 5 to 6 Hz theta slowing. Hyperventilation and photic stimulation were not performed.   ABNORMALITY - Intermittent slow, generalized IMPRESSION: This study is suggestive of mild diffuse encephalopathy, nonspecific etiology. No seizures or epileptiform discharges were seen throughout the recording. Lora Havens   ECHOCARDIOGRAM COMPLETE  Result Date: 01/16/2021    ECHOCARDIOGRAM REPORT   Patient Name:   EARNIE ROCKHOLD Date of Exam: 01/16/2021 Medical Rec #:  [id number]     Height:       66.0 in Accession #:    [id number]    Weight:       145.0 lb Date of Birth:  04/11/1971     BSA:          1.744 m Patient Age:    44 years      BP:           81/20 mmHg Patient Gender: F             HR:           111 bpm. Exam Location:  [name] Procedure: 2D Echo, Cardiac Doppler and Color Doppler Indications:    Syncope  History:        Patient has prior history of Echocardiogram examinations, most                 recent 07/11/2010. COPD, Signs/Symptoms:Dyspnea; Risk                 Factors:Tobacco abuse and Diabetes. COVID +.  Sonographer:    Wenda Low Referring Phys: ASIA B [place]  1. Left ventricular ejection fraction, by estimation, is 70 to 75%. The left ventricle has hyperdynamic function. The left ventricle has no regional wall motion abnormalities. There is mild left ventricular hypertrophy. Left ventricular diastolic parameters are indeterminate.  2. Right ventricular systolic function is normal. The right ventricular size is normal. Tricuspid regurgitation signal is inadequate for assessing PA pressure.  3. No right atrial or right ventricular collapse to suggest tamponade physiology. Respiratory variation in mitral outflow not assessed.  Presence of sinus tachycardia does raise concern however for potential hemodynamic significance. Follow-up clinically  and suggest repeat limited echocardiogram within tht next  48 hours. Moderate pericardial effusion. The pericardial effusion is circumferential.  4. The mitral valve is grossly normal. Mild mitral valve regurgitation.  5. The aortic valve is tricuspid. Aortic valve regurgitation is not visualized. No aortic stenosis is present. Aortic valve mean gradient measures 6.0 mmHg.  6. The inferior vena cava is normal in size with <50% respiratory variability, suggesting right atrial pressure of 8 mmHg. Comparison(s): No prior Echocardiogram. FINDINGS  Left Ventricle: Left ventricular ejection fraction, by estimation, is 70 to 75%. The left ventricle has hyperdynamic function. The left ventricle has no regional wall motion abnormalities. The left ventricular internal cavity size was normal in size. There is mild left ventricular hypertrophy. Left ventricular diastolic parameters are indeterminate. Right Ventricle: The right ventricular size is normal. No increase in right ventricular wall thickness. Right ventricular systolic function is normal. Tricuspid regurgitation signal is inadequate for assessing PA pressure. Left Atrium: Left atrial size was normal in size. Right Atrium: Right atrial size was normal in size. Pericardium: No right atrial or right ventricular collapse to suggest tamponade physiology. Respiratory variation in mitral outflow not assessed. Presence of sinus tachycardia does raise concern however for potential hemodynamic significance. Follow-up clinically and suggest repeat limited echocardiogram within tht next 48 hours. A moderately sized pericardial effusion is present. The pericardial effusion is circumferential. Mitral Valve: The mitral valve is grossly normal. Mild mitral valve regurgitation. MV peak gradient, 13.1 mmHg. The mean mitral valve gradient is 6.0 mmHg. Tricuspid Valve:  The tricuspid valve is grossly normal. Tricuspid valve regurgitation is trivial. Aortic Valve: The aortic valve is tricuspid. Aortic valve regurgitation is not visualized. No aortic stenosis is present. Aortic valve mean gradient measures 6.0 mmHg. Aortic valve peak gradient measures 10.8 mmHg. Aortic valve area, by VTI measures 2.42  cm. Pulmonic Valve: The pulmonic valve was grossly normal. Pulmonic valve regurgitation is trivial. Aorta: The aortic root is normal in size and structure. Venous: The inferior vena cava is normal in size with less than 50% respiratory variability, suggesting right atrial pressure of 8 mmHg. IAS/Shunts: No atrial level shunt detected by color flow Doppler.  LEFT VENTRICLE PLAX 2D LVIDd:         3.70 cm  Diastology LVIDs:         2.40 cm  LV e' medial:    9.17 cm/s LV PW:         1.10 cm  LV E/e' medial:  11.2 LV IVS:        1.10 cm  LV e' lateral:   9.17 cm/s LVOT diam:     2.00 cm  LV E/e' lateral: 11.2 LV SV:         58 LV SV Index:   33 LVOT Area:     3.14 cm  RIGHT VENTRICLE RV Basal diam:  2.30 cm RV Mid diam:    2.00 cm RV S prime:     27.70 cm/s TAPSE (M-mode): 3.3 cm LEFT ATRIUM             Index       RIGHT ATRIUM           Index LA diam:        3.30 cm 1.89 cm/m  RA Area:     10.80 cm LA Vol (A2C):   41.8 ml 23.96 ml/m RA Volume:   17.70 ml  10.15 ml/m LA Vol (A4C):   34.7 ml 19.89 ml/m LA Biplane Vol: 38.5 ml 22.07 ml/m  AORTIC VALVE AV Area (Vmax):  2.47 cm AV Area (Vmean):   2.20 cm AV Area (VTI):     2.42 cm AV Vmax:           164.00 cm/s AV Vmean:          109.000 cm/s AV VTI:            0.240 m AV Peak Grad:      10.8 mmHg AV Mean Grad:      6.0 mmHg LVOT Vmax:         129.00 cm/s LVOT Vmean:        76.400 cm/s LVOT VTI:          0.185 m LVOT/AV VTI ratio: 0.77  AORTA Ao Root diam: 3.20 cm MITRAL VALVE MV Area (PHT): 5.02 cm     SHUNTS MV Area VTI:   1.73 cm     Systemic VTI:  0.18 m MV Peak grad:  13.1 mmHg    Systemic Diam: 2.00 cm MV Mean grad:  6.0  mmHg MV Vmax:       1.81 m/s MV Vmean:      109.0 cm/s MV Decel Time: 151 msec MV E velocity: 103.00 cm/s MV A velocity: 168.00 cm/s MV E/A ratio:  0.61 Rozann Lesches MD Electronically signed by Rozann Lesches MD Signature Date/Time: 01/16/2021/4:08:15 PM    Final    ECHOCARDIOGRAM LIMITED  Result Date: 01/22/2021    ECHOCARDIOGRAM LIMITED REPORT   Patient Name:   SHANNIN NAAB Date of Exam: 01/22/2021 Medical Rec #:  [id number]     Height:       66.0 in Accession #:    [id number]    Weight:       161.6 lb Date of Birth:  1970/12/28     BSA:          1.827 m Patient Age:    63 years      BP:           107/64 mmHg Patient Gender: F             HR:           88 bpm. Exam Location:  [name] Procedure: Limited Echo Indications:    Pericardial effusion  History:        Patient has prior history of Echocardiogram examinations, most                 recent 01/19/2021. COPD, Arrythmias:PVC, Signs/Symptoms:Chest                 Pain; Risk Factors:Diabetes. COVID +, Tobacco abuse, Sepsis.  Sonographer:    Wenda Low Referring Phys: [phone] Tanika Bracco IMPRESSIONS  1. Left ventricular ejection fraction, by estimation, is 60 to 65%. The left ventricle has normal function.  2. Moderate pericardial effusion. The pericardial effusion is circumferential. There is no evidence of cardiac tamponade. Effusion is stable compared to the 01/19/21 study  3. The inferior vena cava is normal in size with greater than 50% respiratory variability, suggesting right atrial pressure of 3 mmHg.  4. Limited echo to evaluate pericardial effusion FINDINGS  Left Ventricle: Left ventricular ejection fraction, by estimation, is 60 to 65%. The left ventricle has normal function. Pericardium: A moderately sized pericardial effusion is present. The pericardial effusion is circumferential. There is no evidence of cardiac tamponade. Venous: The inferior vena cava is normal in size with greater than 50% respiratory variability, suggesting right atrial  pressure of 3 mmHg. LEFT VENTRICLE PLAX 2D LVIDd:  3.20 cm LVIDs:         2.40 cm LV PW:         1.20 cm LV IVS:        0.90 cm LVOT diam:     2.10 cm LVOT Area:     3.46 cm  LEFT ATRIUM         Index LA diam:    3.20 cm 1.75 cm/m   AORTA Ao Root diam: 3.40 cm  SHUNTS Systemic Diam: 2.10 cm Carlyle Dolly MD Electronically signed by Carlyle Dolly MD Signature Date/Time: 01/22/2021/11:50:46 AM    Final    ECHOCARDIOGRAM LIMITED  Result Date: 01/19/2021    ECHOCARDIOGRAM LIMITED REPORT   Patient Name:   [facility] Date of Exam: 01/19/2021 Medical Rec #:  [id number]     Height:       66.0 in Accession #:    [id number]    Weight:       161.6 lb Date of Birth:  1970-05-04     BSA:          1.827 m Patient Age:    40 years      BP:           99/68 mmHg Patient Gender: F             HR:           96 bpm. Exam Location:  [name] Procedure: Limited Echo Indications:    Pericardial effusion  History:        Patient has prior history of Echocardiogram examinations, most                 recent 01/16/2021. COPD, Arrythmias:PVC, Signs/Symptoms:Chest                 Pain; Risk Factors:Diabetes. COVID +, Tobacco Abuse, Sepsis,                 Pericardial effusion.  Sonographer:    Wenda Low Referring Phys: [place]  1. Left ventricular ejection fraction, by estimation, is 60 to 65%. The left ventricle has normal function.  2. Moderate pericardial effusion. The pericardial effusion is circumferential. There is no evidence of cardiac tamponade. Effusion looks larger compared to the 01/16/2021 study however remains overall moderate by measurements. Recommend repeating limited  study in 72 hours. FINDINGS  Left Ventricle: Left ventricular ejection fraction, by estimation, is 60 to 65%. The left ventricle has normal function. Pericardium: A moderately sized pericardial effusion is present. The pericardial effusion is circumferential. There is no evidence of cardiac tamponade. LEFT  VENTRICLE PLAX 2D LVIDd:         3.90 cm LVIDs:         2.40 cm LV PW:         1.00 cm LV IVS:        1.00 cm LVOT diam:     2.00 cm LVOT Area:     3.14 cm  LEFT ATRIUM         Index LA diam:    3.40 cm 1.86 cm/m   SHUNTS Systemic Diam: 2.00 cm Carlyle Dolly MD Electronically signed by Carlyle Dolly MD Signature Date/Time: 01/19/2021/11:20:45 AM    Final    [place] EKG SITE RITE  Result Date: 01/16/2021 If Site Rite image not attached, placement could not be confirmed due to current cardiac rhythm.   Orson Eva, DO  [facility]  If 7PM-7AM, please contact night-coverage [email or web address] Password  [email or web address] 02/09/2021, 6:20 PM   LOS: 6 days

## 2021-02-10 ENCOUNTER — Encounter (HOSPITAL_COMMUNITY): Payer: Self-pay | Admitting: Internal Medicine

## 2021-02-10 DIAGNOSIS — K56699 Other intestinal obstruction unspecified as to partial versus complete obstruction: Secondary | ICD-10-CM | POA: Diagnosis not present

## 2021-02-10 DIAGNOSIS — K921 Melena: Secondary | ICD-10-CM | POA: Diagnosis not present

## 2021-02-10 DIAGNOSIS — K529 Noninfective gastroenteritis and colitis, unspecified: Secondary | ICD-10-CM | POA: Diagnosis not present

## 2021-02-10 LAB — CBC
HCT: 32.4 % — ABNORMAL LOW (ref 36.0–46.0)
Hemoglobin: 10.6 g/dL — ABNORMAL LOW (ref 12.0–15.0)
MCH: 31 pg (ref 26.0–34.0)
MCHC: 32.7 g/dL (ref 30.0–36.0)
MCV: 94.7 fL (ref 80.0–100.0)
Platelets: 387 10*3/uL (ref 150–400)
RBC: 3.42 MIL/uL — ABNORMAL LOW (ref 3.87–5.11)
RDW: 13.6 % (ref 11.5–15.5)
WBC: 5 10*3/uL (ref 4.0–10.5)
nRBC: 0 % (ref 0.0–0.2)

## 2021-02-10 LAB — HEPATITIS B SURFACE ANTIBODY, QUANTITATIVE: Hep B S AB Quant (Post): <3.1 m[IU]/mL — ABNORMAL LOW (ref >9.9)

## 2021-02-10 LAB — GLUCOSE, CAPILLARY: Glucose-Capillary: 116 mg/dL — ABNORMAL HIGH (ref 70–99)

## 2021-02-10 LAB — COMPREHENSIVE METABOLIC PANEL
ALT: 22 U/L (ref 0–44)
AST: 28 U/L (ref 15–41)
Albumin: 3 g/dL — ABNORMAL LOW (ref 3.5–5.0)
Alkaline Phosphatase: 73 U/L (ref 38–126)
Anion gap: 8 (ref 5–15)
BUN: 5 mg/dL — ABNORMAL LOW (ref 6–20)
CO2: 27 mmol/L (ref 22–32)
Calcium: 9.1 mg/dL (ref 8.9–10.3)
Chloride: 105 mmol/L (ref 98–111)
Creatinine, Ser: 0.77 mg/dL (ref 0.44–1.00)
GFR, Estimated: >60 mL/min (ref >60)
Glucose, Bld: 87 mg/dL (ref 70–99)
Potassium: 3.2 mmol/L — ABNORMAL LOW (ref 3.5–5.1)
Sodium: 140 mmol/L (ref 135–145)
Total Bilirubin: 0.4 mg/dL (ref 0.3–1.2)
Total Protein: 5.8 g/dL — ABNORMAL LOW (ref 6.5–8.1)

## 2021-02-10 LAB — PHOSPHORUS: Phosphorus: 3.3 mg/dL (ref 2.5–4.6)

## 2021-02-10 NOTE — Progress Notes (Signed)
    Subjective: Abdominal pain improved. Less bleeding. Still with numerous loose stools. States after a BM, she has urgency to go again, which happens several times till done. States appetite is poor.   Objective: Vital signs in last 24 hours: Temp:  [97.4 F (36.3 C)-98.1 F (36.7 C)] 98.1 F (36.7 C) (10/11 0506) Pulse Rate:  [80-85] 80 (10/11 0506) Resp:  [16-18] 18 (10/11 0506) BP: (126-129)/(68-74) 126/69 (10/11 0506) SpO2:  [98 %-99 %] 99 % (10/11 0506) Last BM Date: 02/10/21 General:   Alert and oriented, pleasant Head:  Normocephalic and atraumatic. Abdomen:  Bowel sounds present, soft, TTP lower abdomen, non-distended. No HSM or hernias noted. No rebound or guarding. No masses appreciated  Neurologic:  Alert and  oriented x4  Intake/Output from previous day: 10/10 0701 - 10/11 0700 In: 1280 [P.O.:1280] Out: 1400 [Urine:1400] Intake/Output this shift: No intake/output data recorded.  Lab Results: Recent Labs    02/08/21 0451 02/09/21 0512 02/10/21 0530  WBC 5.0 5.2 5.0  HGB 10.6* 10.1* 10.6*  HCT 33.1* 32.2* 32.4*  PLT 406* 310 387   BMET Recent Labs    02/10/21 0530  NA 140  K 3.2*  CL 105  CO2 27  GLUCOSE 87  BUN 5*  CREATININE 0.77  CALCIUM 9.1   LFT Recent Labs    02/10/21 0530  PROT 5.8*  ALBUMIN 3.0*  AST 28  ALT 22  ALKPHOS 22  BILITOT 0.4     Assessment/PLAN:  50 year old female with reported history of Crohn's disease diagnosed in 1998 at [facility] per patient and without therapy as multiple colonoscopies and imaging since then without active disease, presenting with rectal bleeding this admission. CT abd/pelvis with contrast noting slightly improved but persistent distal colitis on CT (compared to Sept 2022 imaging). Flex sig completed this admission with diffuse, severe inflammation involving rectum and sigmoid secondary to proctosigmoid colitis and sigmoid stricture. Pathology with granulation tissue and no viable colon tissue  identified. Cdiff and GI pathogen panel negative.    Discussed case with Surgery here, who recommends tertiary discussion. I had contacted [facility] yesterday regarding transfer. [facility] has recommended continuing with IV steroids during flare and cooling off prior to any surgical intervention. [facility] declining transfer.   At this point, she notes improvement from yesterday with less rectal bleeding. Stool urgency continues and numerous stools yet decreasing as well (4 this morning).   Hep B serologies negative. TB gold assay remains in process. Sed rate elevated at 36. CRP normal.   Will continue with IV steroids and plan on flex sig in 3-4 weeks as outpatient. Anticipate Surgical referral as outpatient and will likely need Tertiary involvement.    Annitta Needs, PhD, ANP-BC [facility]     LOS: 7 days    02/10/2021, 9:33 AM

## 2021-02-10 NOTE — Progress Notes (Signed)
PROGRESS NOTE  [name] ZOX:[id number] DOB: [birth year] DOA: 02/03/2021 PCP: Teodoro Kil, PA-C   Brief History:    50 y.o. female, with history of COPD, questionable Crohn's, GERD, HTN, T2DM, xanax withdrawal seizure, tobacco abuse presenting with 1 week history of hematochezia and intermittent nausea and vomiting.  The patient was recent admitted to the hospital from 01/16/2021 to 01/24/2021 secondary to sepsis due to colitis.  The patient was seen by GI and it was felt that the patient could be treated with antibiotics.  She was discharged home with 5 additional days of Cipro and Flagyl.  Any procedures were felt to be able to be deferred into the outpatient setting.  The patient states that she was noncompliant with her antibiotics.  She only took 1 day of her antibiotics after discharge which caused her to have nausea and vomiting.  As result, she stopped taking the antibiotics.  During her last hospitalization, the patient was also treated for Xanax withdrawal seizures as well as a COVID-19 infection for which she was largely asymptomatic. She denies any fevers, chills, chest pain, shortness breath, headache, neck pain, coughing, hemoptysis.  She continues to smoke occasionally. In the emergency department, the patient was afebrile hemodynamically stable with oxygen saturation 100% room air.  WBC 8.8, hemoglobin 13.4, platelets 729K.  Sodium 136, potassium 3.7, BUN less than 5, creatinine 0.86.  CT of the abdomen and pelvis showed slightly improved but persistent bowel wall thickening and pericolonic stranding involving the descending and rectosigmoid colon.  She was started on ceftriaxone and metronidazole.  GI was consulted to assist.  She underwent flex sig on 02/06/21 which suggested IBD.  Antibiotics were discontinued and she was started on IV solumedrol.   10/9--abdominal pain a little worse than 10/8 but still better than admission day.  Had "1/4 cup" blood per rectum last  night.  Tolerating diet. Hgb remains stable 10/11--abdominal pain continues to improve.  BMs slowing down.  No further hematochezia   Assessment/Plan: Hematochezia/colitis -Likely secondary to the patient's colitis -She was seen by GI during her last hospital admission who felt this was more likely to be infectious rather than IBD. -initially started ceftriaxone and metronidazole -she was tolerating ceftriaxone without difficulty -Consult GI>>planning colonoscopy -C. difficile negative -Stool pathogen panel--neg -10/7 flex sig--Diffuse severe inflammation characterized by erosions, erythema and shallow ulcerations was found in the rectum and in the sigmoid colon extending up to approx 15 cm from the anal verge -10/7 discussed with GI, Dr. Carver--d/c abx, start IV solumedrol for presumptive IBD flare -continue IV steroids>>BM's decreasing; abd pain improving; no further hematochezia -10/10--discussed case with [facility]--Dr. Atilano Ina transfer; recommends referral to outpt multidisciplinary IBD clinic [phone] -CRP <0.5   Hypokalemia/hypomagnesemia -Repleted   Anxiety -Restart Xanax home dose -PDMP reviewed shows that the patient received Xanax 1 mg, #90 monthly   Pericardial effusion -01/22/2021 echo EF 60 to 65%, moderate pericardial effusion unchanged from 01/19/2021 -Patient is hemodynamically stable   COPD -Stable on room air   Tobacco abuse -Tobacco cessation discussed   THC abuse -cessation discussed   Personal History of PCN Allergy -patient has tolerated ceftriaxone without difficulty this admission             Status is: Inpatient   Remains inpatient appropriate because:Inpatient level of care appropriate due to severity of illness   Dispo: The patient is from: Home              Anticipated  d/c is to: Home              Patient currently is not medically stable to d/c.              Difficult to place patient No               Family  Communication:  significant other at bedside 10/11   Consultants:  GI   Code Status:  FULL    DVT Prophylaxis:  SCDs     Procedures: As Listed in Progress Note Above   Antibiotics: Ceftriaxone 10/5>>10/7 Metronidazole 10/4>>10/7    Subjective: Patient states her abd pain is improving and volume of stools improving.  No further hematochezia.  Denies f/c, cp, sob, n/v/d, abd pain  Objective: Vitals:   02/10/21 0506 02/10/21 1006 02/10/21 1059 02/10/21 1427  BP: 126/69  129/74 130/68  Pulse: 80  92 88  Resp: 18  18 20   Temp: 98.1 F (36.7 C)  98.8 F (37.1 C) 97.9 F (36.6 C)  TempSrc:   Oral Oral  SpO2: 99% 98% 99% 97%  Weight:      Height:        Intake/Output Summary (Last 24 hours) at 02/10/2021 1733 Last data filed at 02/10/2021 1300 Gross per 24 hour  Intake 880 ml  Output 300 ml  Net 580 ml   Weight change:  Exam:  General:  Pt is alert, follows commands appropriately, not in acute distress HEENT: No icterus, No thrush, No neck mass, [place]/AT Cardiovascular: RRR, S1/S2, no rubs, no gallops Respiratory: CTA bilaterally, no wheezing, no crackles, no rhonchi Abdomen: Soft/+BS, non tender, non distended, no guarding Extremities: No edema, No lymphangitis, No petechiae, No rashes, no synovitis   Data Reviewed: I have personally reviewed following labs and imaging studies Basic Metabolic Panel: Recent Labs  Lab 02/04/21 0455 02/05/21 0513 02/06/21 0533 02/09/21 1545 02/10/21 0530  NA 136 139 141  --  140  K 3.7 3.5 3.7  --  3.2*  CL 104 106 108  --  105  CO2 27 27 27   --  27  GLUCOSE 111* 97 89  --  87  BUN <5* 6 5*  --  5*  CREATININE 0.77 0.90 0.77  --  0.77  CALCIUM 8.2* 8.3* 9.0  --  9.1  MG  --  2.0 2.0 1.7  --   PHOS  --   --   --   --  3.3   Liver Function Tests: Recent Labs  Lab 02/10/21 0530  AST 28  ALT 22  ALKPHOS 73  BILITOT 0.4  PROT 5.8*  ALBUMIN 3.0*   No results for input(s): LIPASE, AMYLASE in the last 168 hours. No  results for input(s): AMMONIA in the last 168 hours. Coagulation Profile: No results for input(s): INR, PROTIME in the last 168 hours. CBC: Recent Labs  Lab 02/05/21 0513 02/06/21 0533 02/08/21 0451 02/09/21 0512 02/10/21 0530  WBC 5.0 4.1 5.0 5.2 5.0  HGB 10.7* 11.4* 10.6* 10.1* 10.6*  HCT 33.6* 35.8* 33.1* 32.2* 32.4*  MCV 96.3 96.5 94.6 94.4 94.7  PLT 507* 450* 406* 310 387   Cardiac Enzymes: No results for input(s): CKTOTAL, CKMB, CKMBINDEX, TROPONINI in the last 168 hours. BNP: Invalid input(s): POCBNP CBG: Recent Labs  Lab 02/05/21 0748 02/05/21 1209 02/05/21 1636 02/06/21 1042 02/06/21 2121  GLUCAP 98 78 80 81 105*   HbA1C: No results for input(s): HGBA1C in the last 72 hours. Urine analysis:  Component Value Date/Time   COLORURINE YELLOW 02/04/2021 0956   APPEARANCEUR CLEAR 02/04/2021 0956   LABSPEC 1.005 02/04/2021 0956   PHURINE 6.0 02/04/2021 0956   GLUCOSEU NEGATIVE 02/04/2021 0956   GLUCOSEU NEG mg/dL 12/24/2009 2317   HGBUR MODERATE (A) 02/04/2021 0956   BILIRUBINUR NEGATIVE 02/04/2021 0956   KETONESUR NEGATIVE 02/04/2021 0956   PROTEINUR 30 (A) 02/04/2021 0956   UROBILINOGEN 0.2 12/26/2013 1530   NITRITE NEGATIVE 02/04/2021 0956   LEUKOCYTESUR MODERATE (A) 02/04/2021 0956   Sepsis Labs: @LABRCNTIP (procalcitonin:4,lacticidven:4) ) Recent Results (from the past 240 hour(s))  C Difficile Quick Screen w PCR reflex     Status: None   Collection Time: 02/03/21  9:49 PM   Specimen: STOOL  Result Value Ref Range Status   C Diff antigen NEGATIVE NEGATIVE Final   C Diff toxin NEGATIVE NEGATIVE Final   C Diff interpretation No C. difficile detected.  Final    Comment: Performed at [facility], [address], [place], [place] [address]  Gastrointestinal Panel by PCR , Stool     Status: None   Collection Time: 02/03/21  9:49 PM   Specimen: STOOL  Result Value Ref Range Status   Campylobacter species NOT DETECTED NOT DETECTED Final   Plesimonas  shigelloides NOT DETECTED NOT DETECTED Final   Salmonella species NOT DETECTED NOT DETECTED Final   Yersinia enterocolitica NOT DETECTED NOT DETECTED Final   Vibrio species NOT DETECTED NOT DETECTED Final   Vibrio cholerae NOT DETECTED NOT DETECTED Final   Enteroaggregative E coli (EAEC) NOT DETECTED NOT DETECTED Final   Enteropathogenic E coli (EPEC) NOT DETECTED NOT DETECTED Final   Enterotoxigenic E coli (ETEC) NOT DETECTED NOT DETECTED Final   Shiga like toxin producing E coli (STEC) NOT DETECTED NOT DETECTED Final   Shigella/Enteroinvasive E coli (EIEC) NOT DETECTED NOT DETECTED Final   Cryptosporidium NOT DETECTED NOT DETECTED Final   Cyclospora cayetanensis NOT DETECTED NOT DETECTED Final   Entamoeba histolytica NOT DETECTED NOT DETECTED Final   Giardia lamblia NOT DETECTED NOT DETECTED Final   Adenovirus F40/41 NOT DETECTED NOT DETECTED Final   Astrovirus NOT DETECTED NOT DETECTED Final   Norovirus GI/GII NOT DETECTED NOT DETECTED Final   Rotavirus A NOT DETECTED NOT DETECTED Final   Sapovirus (I, II, IV, and V) NOT DETECTED NOT DETECTED Final    Comment: Performed at [facility], [address], [place], [place] [address]  Urine Culture     Status: None   Collection Time: 02/04/21  9:56 AM   Specimen: Urine, Clean Catch  Result Value Ref Range Status   Specimen Description   Final    URINE, CLEAN CATCH Performed at [facility], [address] [place], [place] [address]    Special Requests   Final    NONE Performed at [facility], [address] [place], [place] [address]    Culture   Final    NO GROWTH Performed at [facility], [address] [place], [place] [address]    Report Status 02/05/2021 FINAL  Final  SARS Coronavirus 2 by RT PCR (hospital order, performed in [facility] hospital lab) Nasopharyngeal Nasopharyngeal Swab     Status: None   Collection Time: 02/06/21  6:31 AM   Specimen: Nasopharyngeal Swab  Result Value Ref Range Status    SARS Coronavirus 2 NEGATIVE NEGATIVE Final    Comment: (NOTE) SARS-CoV-2 target nucleic acids are NOT DETECTED.  The SARS-CoV-2 RNA is generally detectable in upper and lower  respiratory specimens during the acute phase of infection. The lowest concentration of SARS-CoV-2 viral copies this assay can detect is 250 copies / mL. A negative result does not preclude SARS-CoV-2 infection and should not be used as the sole basis for treatment or other patient management decisions.  A negative result may occur with improper specimen collection / handling, submission of specimen other than nasopharyngeal swab, presence of viral mutation(s) within the areas targeted by this assay, and inadequate number of viral copies (<250 copies / mL). A negative result must be combined with clinical observations, patient history, and epidemiological information.  Fact Sheet for Patients:   StrictlyIdeas.no  Fact Sheet for Healthcare Providers: BankingDealers.co.za  This test is not yet approved or  cleared by the [place] FDA and has been authorized for detection and/or diagnosis of SARS-CoV-2 by FDA under an Emergency Use Authorization (EUA).  This EUA will remain in effect (meaning this test can be used) for the duration of the COVID-19 declaration under Section 564(b)(1) of the Act, 21 U.S.C. section 360bbb-3(b)(1), unless the authorization is terminated or revoked sooner.  Performed at [facility], [address], [place], [place] [address]      Scheduled Meds:  ALPRAZolam  1 mg Oral TID   hydrocortisone   Rectal BID   levETIRAcetam  750 mg Oral BID   methylPREDNISolone (SOLU-MEDROL) injection  60 mg Intravenous Daily   pantoprazole  40 mg Oral Daily   Continuous Infusions:  Procedures/Studies: CT HEAD WO CONTRAST (5MM)  Result Date: 01/16/2021 CLINICAL DATA:  Status post seizure. EXAM: CT HEAD WITHOUT CONTRAST TECHNIQUE: Contiguous axial  images were obtained from the base of the skull through the vertex without intravenous contrast. COMPARISON:  June 03, 2020 FINDINGS: Brain: No evidence of acute infarction, hemorrhage, hydrocephalus, extra-axial collection or mass lesion/mass effect. Vascular: No hyperdense vessel or unexpected calcification. Skull: Normal. Negative for fracture or focal lesion. Sinuses/Orbits: A small right maxillary sinus air-fluid level is seen. Other: None. IMPRESSION: 1. No acute intracranial abnormality. Electronically Signed   By: Virgina Norfolk M.D.   On: 01/16/2021 01:08   CT ABDOMEN PELVIS W CONTRAST  Result Date: 02/03/2021 CLINICAL DATA:  Abdominal abscess/infection suspected. Rectal bleeding for 7 days. Denies pain. History of COPD, HTN, DM, hyster, GB, oophorectomy EXAM: CT ABDOMEN AND PELVIS WITH CONTRAST TECHNIQUE: Multidetector CT imaging of the abdomen and pelvis was performed using the standard protocol following bolus administration of intravenous contrast. CONTRAST:  145m OMNIPAQUE IOHEXOL 300 MG/ML  SOLN COMPARISON:  CT abdomen pelvis 01/21/2021 FINDINGS: Lower chest: Partially visualized slightly improved trace pericardial effusion. Resolution of bilateral pleural effusions. No acute abnormality. Hepatobiliary: Focally hypodensity of the hepatic parenchyma along the falciform ligament likely represents focal fatty infiltration. No focal liver abnormality. Status post cholecystectomy. No biliary dilatation. Pancreas: No focal lesion. Normal pancreatic contour. No surrounding inflammatory changes. No main pancreatic ductal dilatation. Spleen: Normal in size without focal abnormality. Adrenals/Urinary Tract: No adrenal nodule bilaterally. Bilateral kidneys enhance symmetrically. No hydronephrosis. No hydroureter. The urinary bladder is unremarkable. Resolution of gas associated with the urinary bladder. On delayed imaging, there is no urothelial wall thickening and there are no filling defects in the  opacified portions of the bilateral collecting systems or ureters. Stomach/Bowel: Stomach is within normal limits. No evidence of small bowel wall thickening or dilatation. Slightly improved but persistent bowel wall thickening, submucosal hyperemia, pericolonic fat stranding of the descending colon and rectosigmoid colon. Appendix appears normal. Vascular/Lymphatic: No abdominal aorta or iliac aneurysm. Mild  noncalcified atherosclerotic plaque of the aorta and its branches. No abdominal, pelvic, or inguinal lymphadenopathy. Reproductive: Status post hysterectomy. No adnexal masses. Other: No intraperitoneal free fluid. No intraperitoneal free gas. No organized fluid collection. Musculoskeletal: No abdominal wall hernia or abnormality. No suspicious lytic or blastic osseous lesions. No acute displaced fracture. Multilevel degenerative changes of the spine. IMPRESSION: 1. Slightly improved but persistent distal colitis. 2. Partially visualized slightly improved trace pericardial effusion. Resolution of bilateral pleural effusions. 3.  Aortic Atherosclerosis (ICD10-I70.0). Electronically Signed   By: Iven Finn M.D.   On: 02/03/2021 15:12   CT ABDOMEN PELVIS W CONTRAST  Result Date: 01/21/2021 CLINICAL DATA:  Abdominal pain, acute nonlocalized fever, leukocytosis. Abdominal abscess/infection suspected. COVID positive. History of COPD, collagen vascular disease and Crohn's disease. EXAM: CT ABDOMEN AND PELVIS WITH CONTRAST TECHNIQUE: Multidetector CT imaging of the abdomen and pelvis was performed using the standard protocol following bolus administration of intravenous contrast. CONTRAST:  62m OMNIPAQUE IOHEXOL 350 MG/ML SOLN COMPARISON:  CT 09/14/2015. FINDINGS: Lower chest: New small to moderate dependent pleural effusions with associated bibasilar atelectasis at both lung bases. There is a small pericardial effusion as well. The heart size is normal. No acute vascular findings are evident. Hepatobiliary:  The liver is normal in density without suspicious focal abnormality. No significant biliary dilatation status post cholecystectomy. Pancreas: Unremarkable. No pancreatic ductal dilatation or surrounding inflammatory changes. Spleen: Normal in size without focal abnormality. Adrenals/Urinary Tract: Both adrenal glands appear normal. Both kidneys appear normal. No evidence of urinary tract calculus, hydronephrosis or perinephric soft tissue stranding. The urinary bladder is moderately distended. There is a small amount of air within the bladder lumen. No bladder wall thickening or immediate surrounding inflammatory changes identified. Stomach/Bowel: Enteric contrast was administered and has passed into the mid small bowel. The stomach appears normal for its degree of distention. There is no significant small bowel distension, wall thickening or surrounding inflammation. By report, the patient is status post appendectomy. The proximal colon is fluid-filled and mildly distended. There is diffuse wall thickening of the colon extending from the splenic flexure to the rectum. Wall thickening and enhancement are most prominent within the proximal rectum. Distally, the rectal walls are less well-defined, and distal rectal perforation is difficult to exclude. There is moderate perirectal soft tissue stranding. These inflammatory changes do not appear to directly abut the posterior wall of the bladder. No discrete fistula are seen. Vascular/Lymphatic: There are no enlarged abdominal or pelvic lymph nodes. No acute vascular findings. The portal, superior mesenteric and splenic veins are patent. Reproductive: Hysterectomy. No adnexal mass. There is no gas within the vagina. Other: Small amount of pelvic ascites. As above, possible perirectal fluid without drainable fluid collection or fistula. No other extraluminal fluid collections are identified. There is no free air. Musculoskeletal: No acute or significant osseous findings.  No evidence of discitis or sacroiliitis. IMPRESSION: 1. Findings are consistent with distal colitis, extending from the splenic flexure of the colon into the rectum. This colitis is presumably secondary to the patient's Crohn's disease. 2. The walls of the distal rectum are ill-defined, and rectal perforation difficult to exclude. No drainable fluid collection identified. 3. Gas within the wall of the bladder without bladder wall thickening or immediate surrounding inflammatory changes. The gas could be iatrogenic, although if catheterization has not been recently performed, a colovesical fistula would be a consideration. 4. No evidence of small-bowel inflammation or obstruction. 5. Bilateral pleural effusions with small pericardial effusion and bibasilar atelectasis. 6. These  results will be called to the ordering clinician or representative by the Radiologist Assistant, and communication documented in the PACS or [employer]. Electronically Signed   By: Richardean Sale M.D.   On: 01/21/2021 16:45   US RENAL  Result Date: 01/16/2021 CLINICAL DATA:  Acute kidney injury EXAM: RENAL / URINARY TRACT ULTRASOUND COMPLETE COMPARISON:  None. FINDINGS: Right Kidney: Renal measurements: 10.7 x 5.0 x 5.5 cm = volume: 156 mL. Echogenicity within normal limits. No mass or hydronephrosis visualized. Left Kidney: Renal measurements: 10.3 x 6.0 x 5.3 cm = volume: 172 mL. Echogenicity within normal limits. No mass or hydronephrosis visualized. Bladder: Appears normal for degree of bladder distention. Bilateral ureteral jets are not noted, and the patient is not able to void at the time of the examination. Other: None. IMPRESSION: 1. Unremarkable sonographic appearance of the kidneys. No hydronephrosis. 2. Bilateral ureteral jets are not noted, and the patient is not able to void at the time of the examination. Correlate for urinary retention. Electronically Signed   By: Eddie Candle M.D.   On: 01/16/2021 13:04   US  Carotid Bilateral  Result Date: 01/16/2021 CLINICAL DATA:  Altered mental status, seizures EXAM: BILATERAL CAROTID DUPLEX ULTRASOUND TECHNIQUE: Pearline Cables scale imaging, color Doppler and duplex ultrasound were performed of bilateral carotid and vertebral arteries in the neck. COMPARISON:  None. FINDINGS: Criteria: Quantification of carotid stenosis is based on velocity parameters that correlate the residual internal carotid diameter with NASCET-based stenosis levels, using the diameter of the distal internal carotid lumen as the denominator for stenosis measurement. The following velocity measurements were obtained: RIGHT ICA: 127/43 cm/sec CCA: 161/09 cm/sec SYSTOLIC ICA/CCA RATIO:  0.9 ECA:  118 cm/sec LEFT ICA: 145/41 cm/sec CCA: 604/54 cm/sec SYSTOLIC ICA/CCA RATIO:  1.1 ECA:  107 cm/sec RIGHT CAROTID ARTERY: No significant atherosclerotic plaque or evidence of stenosis in the internal carotid artery. RIGHT VERTEBRAL ARTERY:  Patent with normal antegrade flow. LEFT CAROTID ARTERY: Trace smooth heterogeneous atherosclerotic plaque in the proximal internal carotid artery. By peak systolic velocity criteria in the region of plaque, the estimated stenosis is less than 50%. LEFT VERTEBRAL ARTERY:  Patent with normal antegrade flow. IMPRESSION: 1. Mild (1-49%) stenosis proximal left internal carotid artery secondary to trace smooth heterogeneous atherosclerotic plaque. 2. No significant atherosclerotic plaque or evidence of stenosis in the right internal carotid artery. 3. Vertebral arteries are patent with normal antegrade flow. Signed, Criselda Peaches, MD, [facility] Electronically Signed   By: Jacqulynn Cadet M.D.   On: 01/16/2021 11:00   DG CHEST PORT 1 VIEW  Result Date: 01/17/2021 CLINICAL DATA:  Severe sepsis.  Patient is COVID-19 positive. EXAM: PORTABLE CHEST 1 VIEW COMPARISON:  January 16, 2021 FINDINGS: Heart, hila, mediastinum are  normal. No pneumothorax. No nodules or masses. Minimal haziness in the left costophrenic angle, likely atelectasis. No suspicious infiltrates are identified. IMPRESSION: Minimal haziness in the left costophrenic angle is favored to represent atelectasis. No other abnormalities are identified. No suspicious infiltrates. Electronically Signed   By: Dorise Bullion III M.D.   On: 01/17/2021 08:13   DG Chest Port 1 View  Result Date: 01/16/2021 CLINICAL DATA:  Seizure. EXAM: PORTABLE CHEST 1 VIEW COMPARISON:  May 09, 2016 FINDINGS: The heart size and mediastinal contours are within normal limits. Both lungs are clear. The visualized skeletal structures are unremarkable. IMPRESSION: No active disease. Electronically Signed   By: Virgina Norfolk M.D.   On: 01/16/2021 01:02  DG Foot 2 Views Right  Result Date: 01/19/2021 CLINICAL DATA:  Recent fall with bruising EXAM: RIGHT FOOT - 2 VIEW COMPARISON:  None. FINDINGS: There is no evidence of fracture or dislocation. There is no evidence of arthropathy or other focal bone abnormality. Soft tissues are unremarkable. IMPRESSION: Negative. Electronically Signed   By: Jorje Guild M.D.   On: 01/19/2021 11:14   EEG adult  Result Date: 01/16/2021 Lora Havens, MD     01/16/2021 10:16 PM Patient Name: Kaelea Gathright MRN: [id number] Epilepsy Attending: Lora Havens Referring Physician/Provider: Dr Clearence Ped, [place] B Date: 01/16/2021 Duration: 22.32 mins Patient history: 51 yo F with AMS and metabolic changes, Likely due to xanax withdrawal seizures. EEG to evaluate for seizure Level of alertness: Awake AEDs during EEG study: LEV, GBP Technical aspects: This EEG study was done with scalp electrodes positioned according to the 10-20 International system of electrode placement. Electrical activity was acquired at a sampling rate of 500Hz  and reviewed with a high frequency filter of 70Hz  and a low frequency filter of 1Hz . EEG data were recorded continuously  and digitally stored. Description: The posterior dominant rhythm consists of 9 Hz activity of moderate voltage (25-35 uV) seen predominantly in posterior head regions, symmetric and reactive to eye opening and eye closing. EEG showed intermittent generalized 5 to 6 Hz theta slowing. Hyperventilation and photic stimulation were not performed.   ABNORMALITY - Intermittent slow, generalized IMPRESSION: This study is suggestive of mild diffuse encephalopathy, nonspecific etiology. No seizures or epileptiform discharges were seen throughout the recording. Lora Havens   ECHOCARDIOGRAM COMPLETE  Result Date: 01/16/2021    ECHOCARDIOGRAM REPORT   Patient Name:   JENA TEGELER Date of Exam: 01/16/2021 Medical Rec #:  [id number]     Height:       66.0 in Accession #:    [id number]    Weight:       145.0 lb Date of Birth:  1971/01/13     BSA:          1.744 m Patient Age:    76 years      BP:           81/20 mmHg Patient Gender: F             HR:           111 bpm. Exam Location:  [name] Procedure: 2D Echo, Cardiac Doppler and Color Doppler Indications:    Syncope  History:        Patient has prior history of Echocardiogram examinations, most                 recent 07/11/2010. COPD, Signs/Symptoms:Dyspnea; Risk                 Factors:Tobacco abuse and Diabetes. COVID +.  Sonographer:    Wenda Low Referring Phys: ASIA B [place]  1. Left ventricular ejection fraction, by estimation, is 70 to 75%. The left ventricle has hyperdynamic function. The left ventricle has no regional wall motion abnormalities. There is mild left ventricular hypertrophy. Left ventricular diastolic parameters are indeterminate.  2. Right ventricular systolic function is normal. The right ventricular size is normal. Tricuspid regurgitation signal is inadequate for assessing PA pressure.  3. No right atrial or right ventricular collapse to suggest tamponade physiology. Respiratory variation in mitral outflow not assessed.  Presence of sinus tachycardia does raise concern however for potential hemodynamic significance. Follow-up clinically  and suggest repeat limited echocardiogram within  tht next 48 hours. Moderate pericardial effusion. The pericardial effusion is circumferential.  4. The mitral valve is grossly normal. Mild mitral valve regurgitation.  5. The aortic valve is tricuspid. Aortic valve regurgitation is not visualized. No aortic stenosis is present. Aortic valve mean gradient measures 6.0 mmHg.  6. The inferior vena cava is normal in size with <50% respiratory variability, suggesting right atrial pressure of 8 mmHg. Comparison(s): No prior Echocardiogram. FINDINGS  Left Ventricle: Left ventricular ejection fraction, by estimation, is 70 to 75%. The left ventricle has hyperdynamic function. The left ventricle has no regional wall motion abnormalities. The left ventricular internal cavity size was normal in size. There is mild left ventricular hypertrophy. Left ventricular diastolic parameters are indeterminate. Right Ventricle: The right ventricular size is normal. No increase in right ventricular wall thickness. Right ventricular systolic function is normal. Tricuspid regurgitation signal is inadequate for assessing PA pressure. Left Atrium: Left atrial size was normal in size. Right Atrium: Right atrial size was normal in size. Pericardium: No right atrial or right ventricular collapse to suggest tamponade physiology. Respiratory variation in mitral outflow not assessed. Presence of sinus tachycardia does raise concern however for potential hemodynamic significance. Follow-up clinically and suggest repeat limited echocardiogram within tht next 48 hours. A moderately sized pericardial effusion is present. The pericardial effusion is circumferential. Mitral Valve: The mitral valve is grossly normal. Mild mitral valve regurgitation. MV peak gradient, 13.1 mmHg. The mean mitral valve gradient is 6.0 mmHg. Tricuspid Valve:  The tricuspid valve is grossly normal. Tricuspid valve regurgitation is trivial. Aortic Valve: The aortic valve is tricuspid. Aortic valve regurgitation is not visualized. No aortic stenosis is present. Aortic valve mean gradient measures 6.0 mmHg. Aortic valve peak gradient measures 10.8 mmHg. Aortic valve area, by VTI measures 2.42  cm. Pulmonic Valve: The pulmonic valve was grossly normal. Pulmonic valve regurgitation is trivial. Aorta: The aortic root is normal in size and structure. Venous: The inferior vena cava is normal in size with less than 50% respiratory variability, suggesting right atrial pressure of 8 mmHg. IAS/Shunts: No atrial level shunt detected by color flow Doppler.  LEFT VENTRICLE PLAX 2D LVIDd:         3.70 cm  Diastology LVIDs:         2.40 cm  LV e' medial:    9.17 cm/s LV PW:         1.10 cm  LV E/e' medial:  11.2 LV IVS:        1.10 cm  LV e' lateral:   9.17 cm/s LVOT diam:     2.00 cm  LV E/e' lateral: 11.2 LV SV:         58 LV SV Index:   33 LVOT Area:     3.14 cm  RIGHT VENTRICLE RV Basal diam:  2.30 cm RV Mid diam:    2.00 cm RV S prime:     27.70 cm/s TAPSE (M-mode): 3.3 cm LEFT ATRIUM             Index       RIGHT ATRIUM           Index LA diam:        3.30 cm 1.89 cm/m  RA Area:     10.80 cm LA Vol (A2C):   41.8 ml 23.96 ml/m RA Volume:   17.70 ml  10.15 ml/m LA Vol (A4C):   34.7 ml 19.89 ml/m LA Biplane Vol: 38.5 ml 22.07 ml/m  AORTIC VALVE AV  Area (Vmax):    2.47 cm AV Area (Vmean):   2.20 cm AV Area (VTI):     2.42 cm AV Vmax:           164.00 cm/s AV Vmean:          109.000 cm/s AV VTI:            0.240 m AV Peak Grad:      10.8 mmHg AV Mean Grad:      6.0 mmHg LVOT Vmax:         129.00 cm/s LVOT Vmean:        76.400 cm/s LVOT VTI:          0.185 m LVOT/AV VTI ratio: 0.77  AORTA Ao Root diam: 3.20 cm MITRAL VALVE MV Area (PHT): 5.02 cm     SHUNTS MV Area VTI:   1.73 cm     Systemic VTI:  0.18 m MV Peak grad:  13.1 mmHg    Systemic Diam: 2.00 cm MV Mean grad:  6.0  mmHg MV Vmax:       1.81 m/s MV Vmean:      109.0 cm/s MV Decel Time: 151 msec MV E velocity: 103.00 cm/s MV A velocity: 168.00 cm/s MV E/A ratio:  0.61 Rozann Lesches MD Electronically signed by Rozann Lesches MD Signature Date/Time: 01/16/2021/4:08:15 PM    Final    ECHOCARDIOGRAM LIMITED  Result Date: 01/22/2021    ECHOCARDIOGRAM LIMITED REPORT   Patient Name:   LACHANDA BUCZEK Date of Exam: 01/22/2021 Medical Rec #:  [id number]     Height:       66.0 in Accession #:    [id number]    Weight:       161.6 lb Date of Birth:  21-Aug-1970     BSA:          1.827 m Patient Age:    51 years      BP:           107/64 mmHg Patient Gender: F             HR:           88 bpm. Exam Location:  [name] Procedure: Limited Echo Indications:    Pericardial effusion  History:        Patient has prior history of Echocardiogram examinations, most                 recent 01/19/2021. COPD, Arrythmias:PVC, Signs/Symptoms:Chest                 Pain; Risk Factors:Diabetes. COVID +, Tobacco abuse, Sepsis.  Sonographer:    Wenda Low Referring Phys: [id number] Ayoub Arey IMPRESSIONS  1. Left ventricular ejection fraction, by estimation, is 60 to 65%. The left ventricle has normal function.  2. Moderate pericardial effusion. The pericardial effusion is circumferential. There is no evidence of cardiac tamponade. Effusion is stable compared to the 01/19/21 study  3. The inferior vena cava is normal in size with greater than 50% respiratory variability, suggesting right atrial pressure of 3 mmHg.  4. Limited echo to evaluate pericardial effusion FINDINGS  Left Ventricle: Left ventricular ejection fraction, by estimation, is 60 to 65%. The left ventricle has normal function. Pericardium: A moderately sized pericardial effusion is present. The pericardial effusion is circumferential. There is no evidence of cardiac tamponade. Venous: The inferior vena cava is normal in size with greater than 50% respiratory variability, suggesting right atrial  pressure of 3 mmHg. LEFT  VENTRICLE PLAX 2D LVIDd:         3.20 cm LVIDs:         2.40 cm LV PW:         1.20 cm LV IVS:        0.90 cm LVOT diam:     2.10 cm LVOT Area:     3.46 cm  LEFT ATRIUM         Index LA diam:    3.20 cm 1.75 cm/m   AORTA Ao Root diam: 3.40 cm  SHUNTS Systemic Diam: 2.10 cm Carlyle Dolly MD Electronically signed by Carlyle Dolly MD Signature Date/Time: 01/22/2021/11:50:46 AM    Final    ECHOCARDIOGRAM LIMITED  Result Date: 01/19/2021    ECHOCARDIOGRAM LIMITED REPORT   Patient Name:   [facility] Date of Exam: 01/19/2021 Medical Rec #:  [id number]     Height:       66.0 in Accession #:    [id number]    Weight:       161.6 lb Date of Birth:  1971-01-03     BSA:          1.827 m Patient Age:    55 years      BP:           99/68 mmHg Patient Gender: F             HR:           96 bpm. Exam Location:  [name] Procedure: Limited Echo Indications:    Pericardial effusion  History:        Patient has prior history of Echocardiogram examinations, most                 recent 01/16/2021. COPD, Arrythmias:PVC, Signs/Symptoms:Chest                 Pain; Risk Factors:Diabetes. COVID +, Tobacco Abuse, Sepsis,                 Pericardial effusion.  Sonographer:    Wenda Low Referring Phys: [place]  1. Left ventricular ejection fraction, by estimation, is 60 to 65%. The left ventricle has normal function.  2. Moderate pericardial effusion. The pericardial effusion is circumferential. There is no evidence of cardiac tamponade. Effusion looks larger compared to the 01/16/2021 study however remains overall moderate by measurements. Recommend repeating limited  study in 72 hours. FINDINGS  Left Ventricle: Left ventricular ejection fraction, by estimation, is 60 to 65%. The left ventricle has normal function. Pericardium: A moderately sized pericardial effusion is present. The pericardial effusion is circumferential. There is no evidence of cardiac tamponade. LEFT  VENTRICLE PLAX 2D LVIDd:         3.90 cm LVIDs:         2.40 cm LV PW:         1.00 cm LV IVS:        1.00 cm LVOT diam:     2.00 cm LVOT Area:     3.14 cm  LEFT ATRIUM         Index LA diam:    3.40 cm 1.86 cm/m   SHUNTS Systemic Diam: 2.00 cm Carlyle Dolly MD Electronically signed by Carlyle Dolly MD Signature Date/Time: 01/19/2021/11:20:45 AM    Final    [place] EKG SITE RITE  Result Date: 01/16/2021 If Site Rite image not attached, placement could not be confirmed due to current cardiac rhythm.   Orson Eva,  DO  [facility]  If 7PM-7AM, please contact night-coverage [email or web address] Password [email or web address] 02/10/2021, 5:33 PM   LOS: 7 days

## 2021-02-11 DIAGNOSIS — K921 Melena: Secondary | ICD-10-CM | POA: Diagnosis not present

## 2021-02-11 DIAGNOSIS — F17200 Nicotine dependence, unspecified, uncomplicated: Secondary | ICD-10-CM | POA: Diagnosis not present

## 2021-02-11 DIAGNOSIS — R197 Diarrhea, unspecified: Secondary | ICD-10-CM | POA: Diagnosis not present

## 2021-02-11 DIAGNOSIS — K529 Noninfective gastroenteritis and colitis, unspecified: Secondary | ICD-10-CM | POA: Diagnosis not present

## 2021-02-11 LAB — BASIC METABOLIC PANEL
Anion gap: 8 (ref 5–15)
BUN: 8 mg/dL (ref 6–20)
CO2: 26 mmol/L (ref 22–32)
Calcium: 8.9 mg/dL (ref 8.9–10.3)
Chloride: 104 mmol/L (ref 98–111)
Creatinine, Ser: 0.66 mg/dL (ref 0.44–1.00)
GFR, Estimated: >60 mL/min (ref >60)
Glucose, Bld: 94 mg/dL (ref 70–99)
Potassium: 3 mmol/L — ABNORMAL LOW (ref 3.5–5.1)
Sodium: 138 mmol/L (ref 135–145)

## 2021-02-11 LAB — CBC
HCT: 33.5 % — ABNORMAL LOW (ref 36.0–46.0)
Hemoglobin: 10.7 g/dL — ABNORMAL LOW (ref 12.0–15.0)
MCH: 30.7 pg (ref 26.0–34.0)
MCHC: 31.9 g/dL (ref 30.0–36.0)
MCV: 96 fL (ref 80.0–100.0)
Platelets: 250 10*3/uL (ref 150–400)
RBC: 3.49 MIL/uL — ABNORMAL LOW (ref 3.87–5.11)
RDW: 14 % (ref 11.5–15.5)
WBC: 5.4 10*3/uL (ref 4.0–10.5)
nRBC: 0 % (ref 0.0–0.2)

## 2021-02-11 MED ORDER — DULOXETINE HCL 30 MG PO CPEP
30.0000 mg | ORAL_CAPSULE | Freq: Every day | ORAL | Status: DC
  Administered 2021-02-11 – 2021-02-22 (×12): 30 mg via ORAL
  Filled 2021-02-11 (×14): qty 1

## 2021-02-11 MED ORDER — POTASSIUM CHLORIDE CRYS ER 20 MEQ PO TBCR
60.0000 meq | EXTENDED_RELEASE_TABLET | Freq: Once | ORAL | Status: AC
  Administered 2021-02-11: 60 meq via ORAL
  Filled 2021-02-11: qty 3

## 2021-02-11 MED ORDER — GABAPENTIN 300 MG PO CAPS
300.0000 mg | ORAL_CAPSULE | Freq: Three times a day (TID) | ORAL | Status: DC | PRN
  Administered 2021-02-15 – 2021-02-22 (×14): 300 mg via ORAL
  Filled 2021-02-11 (×14): qty 1

## 2021-02-11 MED ORDER — EZETIMIBE 10 MG PO TABS
10.0000 mg | ORAL_TABLET | Freq: Every day | ORAL | Status: DC
  Administered 2021-02-11 – 2021-02-22 (×12): 10 mg via ORAL
  Filled 2021-02-11 (×12): qty 1

## 2021-02-11 MED ORDER — LORATADINE 10 MG PO TABS
10.0000 mg | ORAL_TABLET | Freq: Every day | ORAL | Status: DC
  Administered 2021-02-11 – 2021-02-22 (×12): 10 mg via ORAL
  Filled 2021-02-11 (×13): qty 1

## 2021-02-11 NOTE — Progress Notes (Signed)
Subjective: Patient reports that she has had 2-3 episodes of bloody diarrhea today. States that she woke up this morning and had had blood diarrhea all over herself. She does endorse lower abdominal pain at this time. She endorses some nausea but denies vomiting. She was not able to eat her breakfast this morning but she did eat some chicken and dumplings that her husband brought her.   Objective: Vital signs in last 24 hours: Temp:  [97.5 F (36.4 C)-98.8 F (37.1 C)] 98.2 F (36.8 C) (10/12 0540) Pulse Rate:  [85-92] 85 (10/12 0540) Resp:  [16-20] 16 (10/12 0540) BP: (116-130)/(64-75) 116/64 (10/12 0540) SpO2:  [97 %-99 %] 97 % (10/12 0540) FiO2 (%):  [21 %] 21 % (10/11 1427) Last BM Date: 02/10/21 General:   Alert and oriented, pleasant Head:  Normocephalic and atraumatic. Eyes:  No icterus, sclera clear. Conjuctiva pink.  Mouth:  Without lesions, mucosa pink and moist.  Heart:  S1, S2 present, no murmurs noted.  Lungs: Clear to auscultation bilaterally, without wheezing, rales, or rhonchi.  Abdomen:  Bowel sounds present, soft, non-distended. No HSM or hernias noted. No rebound or guarding. No masses appreciated. TTP of lower abdomen Msk:  Symmetrical without gross deformities. Normal posture. Pulses:  Normal pulses noted. Neurologic:  Alert and  oriented x4;  grossly normal neurologically. Skin:  Warm and dry, intact without significant lesions.  Psych:  Alert and cooperative. Normal mood and affect.   Lab Results: Recent Labs    02/09/21 0512 02/10/21 0530 02/11/21 0549  WBC 5.2 5.0 5.4  HGB 10.1* 10.6* 10.7*  HCT 32.2* 32.4* 33.5*  PLT 310 387 250   BMET Recent Labs    02/10/21 0530 02/11/21 0549  NA 140 138  K 3.2* 3.0*  CL 105 104  CO2 27 26  GLUCOSE 87 94  BUN 5* 8  CREATININE 0.77 0.66  CALCIUM 9.1 8.9   LFT Recent Labs    02/10/21 0530  PROT 5.8*  ALBUMIN 3.0*  AST 28  ALT 22  ALKPHOS 73  BILITOT 0.4   PT/INR No results for input(s):  LABPROT, INR in the last 72 hours. Hepatitis Panel Recent Labs    02/09/21 1021  HEPBSAG NON REACTIVE    Assessment: 50 year old female with reported hx of crohn's disease diagnosed in 1998 at [facility] per patient and without therapy, as multiple colonoscopies and imaging since then without active disease, presenting with rectal bleeding this admission. Stools studies negative, patient underwent flex sig on 10/7 that revealed severe inflammation involving the rectum and sigmoid, secondary to proctosigmoid colitis and sigmoid stricture, pathology revealed granulation tissue and no viable colon tissue identified.   Case was discussed with Surgery here, who recommended tertiary facility for further management. Roseanne Kaufman, NP with GI service here at [facility] spoke with [facility] yesterday, they recommended continuing IV steroids during flare to help decrease inflammation prior to any surgical intervention, [facility] declined transfer to their facility at that time. Dr. Jenetta Downer discussed the possibility of starting Remicade if she were to have refractory disease.   Hep B serologies negative. TB gold assay remains in process, Sed rate 36, CRP WNL.   Frequency of BMs had continued to improve over the past few days, however, she is having recurrent diarrhea with hematochezia today, as well as lower abdominal pain. she noted a large amount of BRBPR in the toilet and when wiping. Has been having some relief of pain from tylenol but today she does not  feel that it is helping. She was previously tolerating a regular diet, however, this morning she is experiencing some nausea, no episodes of vomiting. She is on day 5 of IV steroids today.   Continued anemia however hgb of 10.7 today,  continuing to remain 10-11 throughout this admission.   Plan: Awaiting TB testing results Continue IV steroids Repeat flex sig in 3-4 weeks outpatient Anticipate surgical referral as outpatient, likely to [facility]  facility Continue to trend H&H Continue Regular diet as tolerated   LOS: 8 days    02/11/2021, 9:20 AM   Angad Nabers L. Alver Sorrow, MSN, APRN, AGNP-C Adult-Gerontology Nurse Practitioner [facility]

## 2021-02-11 NOTE — Progress Notes (Signed)
PROGRESS NOTE   [name]  HYW:[id number] DOB: 1970-06-23 DOA: 02/03/2021 PCP: Teodoro Kil, PA-C   Chief Complaint  Patient presents with   Rectal Bleeding   Level of care: Med-Surg  Brief Admission History:  50 year old female with a history of: anxiety, asthma, chronic low back pain, collagen vascular disease, copd, chrons, gerd, htn, ibs, dm, and a seizure disorder. She presented with hematochezia, nausea, and vomiting. She had been recently admitted 01/16/21-01/24/21 due to sepsis secondary to colitis. She had been discharged on PO antibiotics, did not comply with her antibiotics, and has returned with colitis symptoms. GI has been consulted. She reports that she has had numerous loose bowel movements in the preceding days, reports 02/10/21 was much improved, but reports the presence of bright red blood per rectum the morning of 02/11/21, consistent with her pervious episodes.    Assessment & Plan:   Principal Problem:   Colitis -10/7 flex sig--Diffuse severe inflammation characterized by erosions, erythema and shallow ulcerations was found in the rectum and in the sigmoid colon extending up to approx 15 cm from the anal verge -10/7 discussed with GI, Dr. Carver--d/c abx, start IV solumedrol for presumptive IBD flare -continue IV steroids>>BM's decreasing; abd pain improving; no further hematochezia -10/10--discussed case with [facility]--Dr. Atilano Ina transfer; recommends referral to outpt multidisciplinary IBD clinic [phone] -currently on 53m solu-medrol qd  Active Problems:   TOBACCO ABUSE -cessation discussed    COPD (chronic obstructive pulmonary disease) (HCC) -no current exacerbation, on room air -duoneb prn -tiotropium prn    Diarrhea with hypokalemia and hypomagnesemia -monitoring her electrolytes daily, repletion with PO potassium -magnesium was replenished IV, will continue to monitor    Hematochezia -Likely secondary to the patient's  colitis    Anxiety -on her home dose of Xanax, 159mtid   Pericardial effusion -01/22/2021 echo EF 60 to 65%, moderate pericardial effusion unchanged from 01/19/2021 -Patient is hemodynamically stable   Seizure disorder -on her home keppra dose   DVT prophylaxis: scd's Code Status: full Family Communication: family at bedside Disposition:  Status is: Inpatient  Remains inpatient appropriate because:Persistent severe electrolyte disturbances and IV treatments appropriate due to intensity of illness or inability to take PO  Dispo: The patient is from: Home              Anticipated d/c is to: Home              Patient currently is not medically stable to d/c.   Difficult to place patient No       Consultants:  GI  Procedures:   Flexible sigmoidoscopy  Antimicrobials:  Ceftriaxone 10/5-10/7 Ciprofloxacin 10/4-/10/5 Metronidazole 10/4-10/7   Subjective: Currently she is slightly uncomfortable, was optimistic yesterday, but today she reports the return of more symptoms  Objective: Vitals:   02/10/21 1059 02/10/21 1427 02/10/21 2154 02/11/21 0540  BP: 129/74 130/68 119/75 116/64  Pulse: 92 88 90 85  Resp: 18 20 16 16   Temp: 98.8 F (37.1 C) 97.9 F (36.6 C) (!) 97.5 F (36.4 C) 98.2 F (36.8 C)  TempSrc: Oral Oral Oral Oral  SpO2: 99% 97% 99% 97%  Weight:      Height:        Intake/Output Summary (Last 24 hours) at 02/11/2021 1141 Last data filed at 02/11/2021 0900 Gross per 24 hour  Intake 840 ml  Output --  Net 840 ml   Filed Weights   02/03/21 1140 02/04/21 1546  Weight: 73.3 kg 67.3 kg  Examination:  General exam: Appears calm and tolerant of her current condition  Respiratory system: Clear to auscultation. Respiratory effort normal.  Cardiovascular system: normal S1 & S2 heard. No JVD, murmurs, rubs, gallops or clicks. No pedal edema.  Gastrointestinal system: Abdomen is nondistended, soft and tender in the lower quadrants. Active bowel  sounds heard.  Central nervous system: Alert and oriented. No focal neurological deficits.  Extremities: Moved her extremities appropriately   Skin: No obvious rashes, lesions or ulcers  Psychiatry: Judgement and insight appear normal. Mood & affect appropriate.   Data Reviewed: I have personally reviewed following labs and imaging studies  CBC: Recent Labs  Lab 02/06/21 0533 02/08/21 0451 02/09/21 0512 02/10/21 0530 02/11/21 0549  WBC 4.1 5.0 5.2 5.0 5.4  HGB 11.4* 10.6* 10.1* 10.6* 10.7*  HCT 35.8* 33.1* 32.2* 32.4* 33.5*  MCV 96.5 94.6 94.4 94.7 96.0  PLT 450* 406* 310 387 016    Basic Metabolic Panel: Recent Labs  Lab 02/05/21 0513 02/06/21 0533 02/09/21 1545 02/10/21 0530 02/11/21 0549  NA 139 141  --  140 138  K 3.5 3.7  --  3.2* 3.0*  CL 106 108  --  105 104  CO2 27 27  --  27 26  GLUCOSE 97 89  --  87 94  BUN 6 5*  --  5* 8  CREATININE 0.90 0.77  --  0.77 0.66  CALCIUM 8.3* 9.0  --  9.1 8.9  MG 2.0 2.0 1.7  --   --   PHOS  --   --   --  3.3  --     GFR: Estimated Creatinine Clearance: 78.4 mL/min (by C-G formula based on SCr of 0.66 mg/dL).  Liver Function Tests: Recent Labs  Lab 02/10/21 0530  AST 28  ALT 22  ALKPHOS 73  BILITOT 0.4  PROT 5.8*  ALBUMIN 3.0*    CBG: Recent Labs  Lab 02/05/21 1209 02/05/21 1636 02/06/21 1042 02/06/21 2121 02/10/21 2156  GLUCAP 78 80 81 105* 116*    Recent Results (from the past 240 hour(s))  C Difficile Quick Screen w PCR reflex     Status: None   Collection Time: 02/03/21  9:49 PM   Specimen: STOOL  Result Value Ref Range Status   C Diff antigen NEGATIVE NEGATIVE Final   C Diff toxin NEGATIVE NEGATIVE Final   C Diff interpretation No C. difficile detected.  Final    Comment: Performed at [facility], [address] [place], [place] [address]  Gastrointestinal Panel by PCR , Stool     Status: None   Collection Time: 02/03/21  9:49 PM   Specimen: STOOL  Result Value Ref Range Status    Campylobacter species NOT DETECTED NOT DETECTED Final   Plesimonas shigelloides NOT DETECTED NOT DETECTED Final   Salmonella species NOT DETECTED NOT DETECTED Final   Yersinia enterocolitica NOT DETECTED NOT DETECTED Final   Vibrio species NOT DETECTED NOT DETECTED Final   Vibrio cholerae NOT DETECTED NOT DETECTED Final   Enteroaggregative E coli (EAEC) NOT DETECTED NOT DETECTED Final   Enteropathogenic E coli (EPEC) NOT DETECTED NOT DETECTED Final   Enterotoxigenic E coli (ETEC) NOT DETECTED NOT DETECTED Final   Shiga like toxin producing E coli (STEC) NOT DETECTED NOT DETECTED Final   Shigella/Enteroinvasive E coli (EIEC) NOT DETECTED NOT DETECTED Final   Cryptosporidium NOT DETECTED NOT DETECTED Final   Cyclospora cayetanensis NOT DETECTED NOT DETECTED Final   Entamoeba histolytica NOT DETECTED NOT DETECTED  Final   Giardia lamblia NOT DETECTED NOT DETECTED Final   Adenovirus F40/41 NOT DETECTED NOT DETECTED Final   Astrovirus NOT DETECTED NOT DETECTED Final   Norovirus GI/GII NOT DETECTED NOT DETECTED Final   Rotavirus A NOT DETECTED NOT DETECTED Final   Sapovirus (I, II, IV, and V) NOT DETECTED NOT DETECTED Final    Comment: Performed at [facility], [address] [place], [place] [address]  Urine Culture     Status: None   Collection Time: 02/04/21  9:56 AM   Specimen: Urine, Clean Catch  Result Value Ref Range Status   Specimen Description   Final    URINE, CLEAN CATCH Performed at [facility], [address] [place], [place] [address]    Special Requests   Final    NONE Performed at [facility], [address], [place], [place] [address]    Culture   Final    NO GROWTH Performed at [facility], [place] [address], [place], [place] [address]    Report Status 02/05/2021 FINAL  Final  SARS Coronavirus 2 by RT PCR (hospital order, performed in [facility] hospital lab) Nasopharyngeal Nasopharyngeal Swab     Status: None   Collection Time: 02/06/21  6:31 AM    Specimen: Nasopharyngeal Swab  Result Value Ref Range Status   SARS Coronavirus 2 NEGATIVE NEGATIVE Final    Comment: (NOTE) SARS-CoV-2 target nucleic acids are NOT DETECTED.  The SARS-CoV-2 RNA is generally detectable in upper and lower respiratory specimens during the acute phase of infection. The lowest concentration of SARS-CoV-2 viral copies this assay can detect is 250 copies / mL. A negative result does not preclude SARS-CoV-2 infection and should not be used as the sole basis for treatment or other patient management decisions.  A negative result may occur with improper specimen collection / handling, submission of specimen other than nasopharyngeal swab, presence of viral mutation(s) within the areas targeted by this assay, and inadequate number of viral copies (<250 copies / mL). A negative result must be combined with clinical observations, patient history, and epidemiological information.  Fact Sheet for Patients:   StrictlyIdeas.no  Fact Sheet for Healthcare Providers: BankingDealers.co.za  This test is not yet approved or  cleared by the [place] FDA and has been authorized for detection and/or diagnosis of SARS-CoV-2 by FDA under an Emergency Use Authorization (EUA).  This EUA will remain in effect (meaning this test can be used) for the duration of the COVID-19 declaration under Section 564(b)(1) of the Act, 21 U.S.C. section 360bbb-3(b)(1), unless the authorization is terminated or revoked sooner.  Performed at [facility], [address], [place],  [address]      Radiology Studies: No results found.  Scheduled Meds:  ALPRAZolam  1 mg Oral TID   DULoxetine  30 mg Oral Daily   ezetimibe  10 mg Oral Daily   hydrocortisone   Rectal BID   levETIRAcetam  750 mg Oral BID   loratadine  10 mg Oral Daily   methylPREDNISolone (SOLU-MEDROL) injection  60 mg Intravenous Daily   pantoprazole  40 mg Oral  Daily   Continuous Infusions:   LOS: 8 days   Corney Knighton D Rhyan Wolters, student How to contact the [facility] Attending or Consulting provider [place] or covering provider during after hours [place], for this patient?  Check the care team in [facility] and look for a) attending/consulting TRH provider listed and b) the [facility] team listed Log into [email or web address] and  use [place]'s universal password to access. If you do not have the password, please contact the hospital operator. Locate the [facility] provider you are looking for under [facility] and page to a number that you can be directly reached. If you still have difficulty reaching the provider, please page the [facility] (Director on Call) for the Hospitalists listed on amion for assistance.  02/11/2021, 11:41 AM

## 2021-02-12 DIAGNOSIS — K921 Melena: Secondary | ICD-10-CM | POA: Diagnosis not present

## 2021-02-12 DIAGNOSIS — K529 Noninfective gastroenteritis and colitis, unspecified: Secondary | ICD-10-CM | POA: Diagnosis not present

## 2021-02-12 LAB — BASIC METABOLIC PANEL
Anion gap: 8 (ref 5–15)
BUN: 10 mg/dL (ref 6–20)
CO2: 27 mmol/L (ref 22–32)
Calcium: 8.9 mg/dL (ref 8.9–10.3)
Chloride: 103 mmol/L (ref 98–111)
Creatinine, Ser: 0.76 mg/dL (ref 0.44–1.00)
GFR, Estimated: >60 mL/min (ref >60)
Glucose, Bld: 86 mg/dL (ref 70–99)
Potassium: 3.5 mmol/L (ref 3.5–5.1)
Sodium: 138 mmol/L (ref 135–145)

## 2021-02-12 LAB — CBC
HCT: 32.4 % — ABNORMAL LOW (ref 36.0–46.0)
Hemoglobin: 10.3 g/dL — ABNORMAL LOW (ref 12.0–15.0)
MCH: 30 pg (ref 26.0–34.0)
MCHC: 31.8 g/dL (ref 30.0–36.0)
MCV: 94.5 fL (ref 80.0–100.0)
Platelets: 388 10*3/uL (ref 150–400)
RBC: 3.43 MIL/uL — ABNORMAL LOW (ref 3.87–5.11)
RDW: 14 % (ref 11.5–15.5)
WBC: 5.5 10*3/uL (ref 4.0–10.5)
nRBC: 0 % (ref 0.0–0.2)

## 2021-02-12 LAB — MAGNESIUM: Magnesium: 1.9 mg/dL (ref 1.7–2.4)

## 2021-02-12 LAB — C-REACTIVE PROTEIN: CRP: 0.5 mg/dL (ref <1.0)

## 2021-02-12 LAB — SEDIMENTATION RATE: Sed Rate: 30 mm/hr — ABNORMAL HIGH (ref 0–22)

## 2021-02-12 MED ORDER — CYCLOBENZAPRINE HCL 10 MG PO TABS
10.0000 mg | ORAL_TABLET | Freq: Three times a day (TID) | ORAL | Status: DC | PRN
  Administered 2021-02-12 – 2021-02-13 (×3): 10 mg via ORAL
  Filled 2021-02-12 (×3): qty 1

## 2021-02-12 MED ORDER — AMITRIPTYLINE HCL 25 MG PO TABS
75.0000 mg | ORAL_TABLET | Freq: Every day | ORAL | Status: DC
  Administered 2021-02-12 – 2021-02-21 (×10): 75 mg via ORAL
  Filled 2021-02-12 (×10): qty 3

## 2021-02-12 NOTE — Progress Notes (Signed)
PROGRESS NOTE   [name]  GNO:[id number] DOB: Sep 03, 1970 DOA: 02/03/2021 PCP: Teodoro Kil, PA-C   Chief Complaint  Patient presents with   Rectal Bleeding   Level of care: Med-Surg  Brief Admission History:  50 year old female with a history of: anxiety, asthma, chronic low back pain, collagen vascular disease, copd, chrons, gerd, htn, ibs, dm, and a seizure disorder. She presented with hematochezia, nausea, and vomiting. She had been recently admitted 01/16/21-01/24/21 due to sepsis secondary to colitis. She had been discharged on PO antibiotics, did not comply with her antibiotics, and has returned with colitis symptoms. GI has been consulted. She reports that she has had numerous loose bowel movements in the preceding days, reports 02/10/21 was much improved, but reports the presence of bright red blood per rectum the morning of 02/11/21, consistent with her pervious episodes. The morning of 10/13 she reports increased RLQ pain, blood with bowel movement and wiping. She denies current nausea and vomiting. Currently continuing IV steroids.   Assessment & Plan:   Principal Problem:   Colitis -10/7 flex sig--Diffuse severe inflammation characterized by erosions, erythema and shallow ulcerations was found in the rectum and in the sigmoid colon extending up to approx 15 cm from the anal verge -10/7 discussed with GI, Dr. Carver--d/c abx, start IV solumedrol for presumptive IBD flare -10/10--discussed case with [facility]--Dr. Atilano Ina transfer; recommends referral to outpt multidisciplinary IBD clinic, reduce current inflammation prior to any surgical interventions.  -currently on 76m solu-medrol qd -GI has ben consulted, per GI, she will need biologic. Her Hep B serologies are negative for chronic infection. Her TB gold is still pending.    Active Problems:   TOBACCO ABUSE -cessation discussed     COPD (chronic obstructive pulmonary disease) (HCC) -no current exacerbation,  on room air -duoneb prn -tiotropium prn     Diarrhea with hypokalemia and hypomagnesemia -monitoring her electrolytes daily -currently repleted     Hematochezia -Likely secondary to the patient's colitis, reports continued sporadic bright red blood with bowel movements and wiping. -hemoglobin 10/13 10.3     Anxiety -on her home dose of Xanax, 156mtid    Pericardial effusion -01/22/2021 echo EF 60 to 65%, moderate pericardial effusion unchanged from 01/19/2021 -Patient is hemodynamically stable    Seizure disorder -on her home keppra dose     DVT prophylaxis: scd Code Status: full Family Communication: family at bedside Disposition:  Status is: Inpatient  Remains inpatient appropriate because: continued IV steroids and pain management       Consultants:  GI  Procedures:  Flexible sigmoidoscopy  Antimicrobials:  Ceftriaxone 10/5-10/7 Ciprofloxacin 10/4-/10/5 Metronidazole 10/4-10/7  Subjective: Uncomfortable, had outside food brought in, increased abd pain  Objective: Vitals:   02/11/21 2138 02/12/21 0521 02/12/21 0930 02/12/21 1244  BP: 127/62 (!) 109/52 128/63 127/72  Pulse: 79 78 87 87  Resp: 18 18 16 17   Temp: 97.9 F (36.6 C) 98.3 F (36.8 C) 98.1 F (36.7 C) 98.1 F (36.7 C)  TempSrc: Oral Oral Oral Oral  SpO2: 97% 96% 96% 96%  Weight:      Height:        Intake/Output Summary (Last 24 hours) at 02/12/2021 1409 Last data filed at 02/12/2021 1314 Gross per 24 hour  Intake 1252 ml  Output --  Net 1252 ml   Filed Weights   02/03/21 1140 02/04/21 1546  Weight: 73.3 kg 67.3 kg    Examination:  General exam: Appears calm and comfortable   Respiratory system: Clear  to auscultation. Respiratory effort normal.  Cardiovascular system: normal S1 & S2 heard. No JVD, murmurs, rubs, gallops or clicks. No pedal edema.  Gastrointestinal system: Abdomen is nondistended, soft and tender in the RLQ. Bowel sounds present.  Central nervous  system: Alert and oriented. No focal neurological deficits.  Extremities: moves all extremities appropriately  Skin: No obvious rashes, lesions or ulcers  Psychiatry: Judgement and insight appear normal. Mood & affect appropriate.   Data Reviewed: I have personally reviewed following labs and imaging studies  CBC: Recent Labs  Lab 02/08/21 0451 02/09/21 0512 02/10/21 0530 02/11/21 0549 02/12/21 0601  WBC 5.0 5.2 5.0 5.4 5.5  HGB 10.6* 10.1* 10.6* 10.7* 10.3*  HCT 33.1* 32.2* 32.4* 33.5* 32.4*  MCV 94.6 94.4 94.7 96.0 94.5  PLT 406* 310 387 250 275    Basic Metabolic Panel: Recent Labs  Lab 02/06/21 0533 02/09/21 1545 02/10/21 0530 02/11/21 0549 02/12/21 0601  NA 141  --  140 138 138  K 3.7  --  3.2* 3.0* 3.5  CL 108  --  105 104 103  CO2 27  --  27 26 27   GLUCOSE 89  --  87 94 86  BUN 5*  --  5* 8 10  CREATININE 0.77  --  0.77 0.66 0.76  CALCIUM 9.0  --  9.1 8.9 8.9  MG 2.0 1.7  --   --  1.9  PHOS  --   --  3.3  --   --     GFR: Estimated Creatinine Clearance: 78.4 mL/min (by C-G formula based on SCr of 0.76 mg/dL).  Liver Function Tests: Recent Labs  Lab 02/10/21 0530  AST 28  ALT 22  ALKPHOS 73  BILITOT 0.4  PROT 5.8*  ALBUMIN 3.0*    CBG: Recent Labs  Lab 02/05/21 1636 02/06/21 1042 02/06/21 2121 02/10/21 2156  GLUCAP 80 81 105* 116*    Recent Results (from the past 240 hour(s))  C Difficile Quick Screen w PCR reflex     Status: None   Collection Time: 02/03/21  9:49 PM   Specimen: STOOL  Result Value Ref Range Status   C Diff antigen NEGATIVE NEGATIVE Final   C Diff toxin NEGATIVE NEGATIVE Final   C Diff interpretation No C. difficile detected.  Final    Comment: Performed at [facility], [address] [place], [place] [address]  Gastrointestinal Panel by PCR , Stool     Status: None   Collection Time: 02/03/21  9:49 PM   Specimen: STOOL  Result Value Ref Range Status   Campylobacter species NOT DETECTED NOT DETECTED Final    Plesimonas shigelloides NOT DETECTED NOT DETECTED Final   Salmonella species NOT DETECTED NOT DETECTED Final   Yersinia enterocolitica NOT DETECTED NOT DETECTED Final   Vibrio species NOT DETECTED NOT DETECTED Final   Vibrio cholerae NOT DETECTED NOT DETECTED Final   Enteroaggregative E coli (EAEC) NOT DETECTED NOT DETECTED Final   Enteropathogenic E coli (EPEC) NOT DETECTED NOT DETECTED Final   Enterotoxigenic E coli (ETEC) NOT DETECTED NOT DETECTED Final   Shiga like toxin producing E coli (STEC) NOT DETECTED NOT DETECTED Final   Shigella/Enteroinvasive E coli (EIEC) NOT DETECTED NOT DETECTED Final   Cryptosporidium NOT DETECTED NOT DETECTED Final   Cyclospora cayetanensis NOT DETECTED NOT DETECTED Final   Entamoeba histolytica NOT DETECTED NOT DETECTED Final   Giardia lamblia NOT DETECTED NOT DETECTED Final   Adenovirus F40/41 NOT DETECTED NOT DETECTED Final   Astrovirus  NOT DETECTED NOT DETECTED Final   Norovirus GI/GII NOT DETECTED NOT DETECTED Final   Rotavirus A NOT DETECTED NOT DETECTED Final   Sapovirus (I, II, IV, and V) NOT DETECTED NOT DETECTED Final    Comment: Performed at [facility], [address], [place], [place] [address]  Urine Culture     Status: None   Collection Time: 02/04/21  9:56 AM   Specimen: Urine, Clean Catch  Result Value Ref Range Status   Specimen Description   Final    URINE, CLEAN CATCH Performed at [facility], [address], [place], [place] [address]    Special Requests   Final    NONE Performed at [facility], [address] [place], [place] [address]    Culture   Final    NO GROWTH Performed at [facility], [place] [address], [place], [place] [address]    Report Status 02/05/2021 FINAL  Final  SARS Coronavirus 2 by RT PCR (hospital order, performed in [facility] hospital lab) Nasopharyngeal Nasopharyngeal Swab     Status: None   Collection Time: 02/06/21  6:31 AM   Specimen: Nasopharyngeal Swab  Result Value Ref Range  Status   SARS Coronavirus 2 NEGATIVE NEGATIVE Final    Comment: (NOTE) SARS-CoV-2 target nucleic acids are NOT DETECTED.  The SARS-CoV-2 RNA is generally detectable in upper and lower respiratory specimens during the acute phase of infection. The lowest concentration of SARS-CoV-2 viral copies this assay can detect is 250 copies / mL. A negative result does not preclude SARS-CoV-2 infection and should not be used as the sole basis for treatment or other patient management decisions.  A negative result may occur with improper specimen collection / handling, submission of specimen other than nasopharyngeal swab, presence of viral mutation(s) within the areas targeted by this assay, and inadequate number of viral copies (<250 copies / mL). A negative result must be combined with clinical observations, patient history, and epidemiological information.  Fact Sheet for Patients:   StrictlyIdeas.no  Fact Sheet for Healthcare Providers: BankingDealers.co.za  This test is not yet approved or  cleared by the [place] FDA and has been authorized for detection and/or diagnosis of SARS-CoV-2 by FDA under an Emergency Use Authorization (EUA).  This EUA will remain in effect (meaning this test can be used) for the duration of the COVID-19 declaration under Section 564(b)(1) of the Act, 21 U.S.C. section 360bbb-3(b)(1), unless the authorization is terminated or revoked sooner.  Performed at [facility], [address], [place], [place] [address]      Radiology Studies: No results found.  Scheduled Meds:  ALPRAZolam  1 mg Oral TID   DULoxetine  30 mg Oral Daily   ezetimibe  10 mg Oral Daily   hydrocortisone   Rectal BID   levETIRAcetam  750 mg Oral BID   loratadine  10 mg Oral Daily   methylPREDNISolone (SOLU-MEDROL) injection  60 mg Intravenous Daily   pantoprazole  40 mg Oral Daily   Continuous Infusions:   LOS: 9 days    Warner Laduca D Kegan Shepardson, student How to contact the [facility] Attending or Consulting provider [place] or covering provider during after hours [place], for this patient?  Check the care team in [facility] and look for a) attending/consulting TRH provider listed and b) the [facility] team listed Log into [email or web address] and use [place]'s universal password to access. If you do not have the password, please contact the hospital operator. Locate the [facility]  provider you are looking for under [facility] and page to a number that you can be directly reached. If you still have difficulty reaching the provider, please page the [facility] (Director on Call) for the Hospitalists listed on amion for assistance.  02/12/2021, 2:09 PM

## 2021-02-12 NOTE — Progress Notes (Signed)
Subjective:  Patient states she had 7 loose bloody stools since 3 am. States her lower abdominal pain is worse today than it has been the whole time she's been sick. States she showed the night nurse one of her BMs but not the rest. No N/V. States most of her pain is RLQ.  Objective: Vital signs in last 24 hours: Temp:  [97.9 F (36.6 C)-98.3 F (36.8 C)] 98.1 F (36.7 C) (10/13 0930) Pulse Rate:  [78-87] 87 (10/13 0930) Resp:  [16-18] 16 (10/13 0930) BP: (109-128)/(52-68) 128/63 (10/13 0930) SpO2:  [96 %-98 %] 96 % (10/13 0930) Last BM Date: 02/11/21 General:   Alert,  Well-developed, well-nourished, pleasant and cooperative in NAD Head:  Normocephalic and atraumatic. Eyes:  Sclera clear, no icterus.  Abdomen:  Soft,  nondistended. Normal bowel sounds, without guarding, and without rebound.  Mild epigastric tenderness, RLQ tenderness. Extremities:  Without clubbing, deformity or edema. Neurologic:  Alert and  oriented x4;  grossly normal neurologically. Skin:  Intact without significant lesions or rashes. Psych:  Alert and cooperative. Normal mood and affect.  Intake/Output from previous day: 10/12 0701 - 10/13 0700 In: 1020 [P.O.:1020] Out: -  Intake/Output this shift: Total I/O In: 232 [P.O.:232] Out: -   Lab Results: CBC Recent Labs    02/10/21 0530 02/11/21 0549 02/12/21 0601  WBC 5.0 5.4 5.5  HGB 10.6* 10.7* 10.3*  HCT 32.4* 33.5* 32.4*  MCV 94.7 96.0 94.5  PLT 387 250 388   BMET Recent Labs    02/10/21 0530 02/11/21 0549 02/12/21 0601  NA 140 138 138  K 3.2* 3.0* 3.5  CL 105 104 103  CO2 27 26 27   GLUCOSE 87 94 86  BUN 5* 8 10  CREATININE 0.77 0.66 0.76  CALCIUM 9.1 8.9 8.9   LFTs Recent Labs    02/10/21 0530  BILITOT 0.4  ALKPHOS 73  AST 28  ALT 22  PROT 5.8*  ALBUMIN 3.0*   Today: CRP 0.5, sed rate 30   No results for input(s): LIPASE in the last 72 hours. PT/INR No results for input(s): LABPROT, INR in the last 72 hours.     Imaging Studies: CT HEAD WO CONTRAST (5MM)  Result Date: 01/16/2021 CLINICAL DATA:  Status post seizure. EXAM: CT HEAD WITHOUT CONTRAST TECHNIQUE: Contiguous axial images were obtained from the base of the skull through the vertex without intravenous contrast. COMPARISON:  June 03, 2020 FINDINGS: Brain: No evidence of acute infarction, hemorrhage, hydrocephalus, extra-axial collection or mass lesion/mass effect. Vascular: No hyperdense vessel or unexpected calcification. Skull: Normal. Negative for fracture or focal lesion. Sinuses/Orbits: A small right maxillary sinus air-fluid level is seen. Other: None. IMPRESSION: 1. No acute intracranial abnormality. Electronically Signed   By: Virgina Norfolk M.D.   On: 01/16/2021 01:08   CT ABDOMEN PELVIS W CONTRAST  Result Date: 02/03/2021 CLINICAL DATA:  Abdominal abscess/infection suspected. Rectal bleeding for 7 days. Denies pain. History of COPD, HTN, DM, hyster, GB, oophorectomy EXAM: CT ABDOMEN AND PELVIS WITH CONTRAST TECHNIQUE: Multidetector CT imaging of the abdomen and pelvis was performed using the standard protocol following bolus administration of intravenous contrast. CONTRAST:  120m OMNIPAQUE IOHEXOL 300 MG/ML  SOLN COMPARISON:  CT abdomen pelvis 01/21/2021 FINDINGS: Lower chest: Partially visualized slightly improved trace pericardial effusion. Resolution of bilateral pleural effusions. No acute abnormality. Hepatobiliary: Focally hypodensity of the hepatic parenchyma along the falciform ligament likely represents focal fatty infiltration. No focal liver abnormality. Status post cholecystectomy. No biliary dilatation. Pancreas: No focal  lesion. Normal pancreatic contour. No surrounding inflammatory changes. No main pancreatic ductal dilatation. Spleen: Normal in size without focal abnormality. Adrenals/Urinary Tract: No adrenal nodule bilaterally. Bilateral kidneys enhance symmetrically. No hydronephrosis. No hydroureter. The urinary bladder is  unremarkable. Resolution of gas associated with the urinary bladder. On delayed imaging, there is no urothelial wall thickening and there are no filling defects in the opacified portions of the bilateral collecting systems or ureters. Stomach/Bowel: Stomach is within normal limits. No evidence of small bowel wall thickening or dilatation. Slightly improved but persistent bowel wall thickening, submucosal hyperemia, pericolonic fat stranding of the descending colon and rectosigmoid colon. Appendix appears normal. Vascular/Lymphatic: No abdominal aorta or iliac aneurysm. Mild noncalcified atherosclerotic plaque of the aorta and its branches. No abdominal, pelvic, or inguinal lymphadenopathy. Reproductive: Status post hysterectomy. No adnexal masses. Other: No intraperitoneal free fluid. No intraperitoneal free gas. No organized fluid collection. Musculoskeletal: No abdominal wall hernia or abnormality. No suspicious lytic or blastic osseous lesions. No acute displaced fracture. Multilevel degenerative changes of the spine. IMPRESSION: 1. Slightly improved but persistent distal colitis. 2. Partially visualized slightly improved trace pericardial effusion. Resolution of bilateral pleural effusions. 3.  Aortic Atherosclerosis (ICD10-I70.0). Electronically Signed   By: Iven Finn M.D.   On: 02/03/2021 15:12   CT ABDOMEN PELVIS W CONTRAST  Result Date: 01/21/2021 CLINICAL DATA:  Abdominal pain, acute nonlocalized fever, leukocytosis. Abdominal abscess/infection suspected. COVID positive. History of COPD, collagen vascular disease and Crohn's disease. EXAM: CT ABDOMEN AND PELVIS WITH CONTRAST TECHNIQUE: Multidetector CT imaging of the abdomen and pelvis was performed using the standard protocol following bolus administration of intravenous contrast. CONTRAST:  4m OMNIPAQUE IOHEXOL 350 MG/ML SOLN COMPARISON:  CT 09/14/2015. FINDINGS: Lower chest: New small to moderate dependent pleural effusions with associated  bibasilar atelectasis at both lung bases. There is a small pericardial effusion as well. The heart size is normal. No acute vascular findings are evident. Hepatobiliary: The liver is normal in density without suspicious focal abnormality. No significant biliary dilatation status post cholecystectomy. Pancreas: Unremarkable. No pancreatic ductal dilatation or surrounding inflammatory changes. Spleen: Normal in size without focal abnormality. Adrenals/Urinary Tract: Both adrenal glands appear normal. Both kidneys appear normal. No evidence of urinary tract calculus, hydronephrosis or perinephric soft tissue stranding. The urinary bladder is moderately distended. There is a small amount of air within the bladder lumen. No bladder wall thickening or immediate surrounding inflammatory changes identified. Stomach/Bowel: Enteric contrast was administered and has passed into the mid small bowel. The stomach appears normal for its degree of distention. There is no significant small bowel distension, wall thickening or surrounding inflammation. By report, the patient is status post appendectomy. The proximal colon is fluid-filled and mildly distended. There is diffuse wall thickening of the colon extending from the splenic flexure to the rectum. Wall thickening and enhancement are most prominent within the proximal rectum. Distally, the rectal walls are less well-defined, and distal rectal perforation is difficult to exclude. There is moderate perirectal soft tissue stranding. These inflammatory changes do not appear to directly abut the posterior wall of the bladder. No discrete fistula are seen. Vascular/Lymphatic: There are no enlarged abdominal or pelvic lymph nodes. No acute vascular findings. The portal, superior mesenteric and splenic veins are patent. Reproductive: Hysterectomy. No adnexal mass. There is no gas within the vagina. Other: Small amount of pelvic ascites. As above, possible perirectal fluid without  drainable fluid collection or fistula. No other extraluminal fluid collections are identified. There is no free air. Musculoskeletal:  No acute or significant osseous findings. No evidence of discitis or sacroiliitis. IMPRESSION: 1. Findings are consistent with distal colitis, extending from the splenic flexure of the colon into the rectum. This colitis is presumably secondary to the patient's Crohn's disease. 2. The walls of the distal rectum are ill-defined, and rectal perforation difficult to exclude. No drainable fluid collection identified. 3. Gas within the wall of the bladder without bladder wall thickening or immediate surrounding inflammatory changes. The gas could be iatrogenic, although if catheterization has not been recently performed, a colovesical fistula would be a consideration. 4. No evidence of small-bowel inflammation or obstruction. 5. Bilateral pleural effusions with small pericardial effusion and bibasilar atelectasis. 6. These results will be called to the ordering clinician or representative by the Radiologist Assistant, and communication documented in the PACS or [employer]. Electronically Signed   By: Richardean Sale M.D.   On: 01/21/2021 16:45   US RENAL  Result Date: 01/16/2021 CLINICAL DATA:  Acute kidney injury EXAM: RENAL / URINARY TRACT ULTRASOUND COMPLETE COMPARISON:  None. FINDINGS: Right Kidney: Renal measurements: 10.7 x 5.0 x 5.5 cm = volume: 156 mL. Echogenicity within normal limits. No mass or hydronephrosis visualized. Left Kidney: Renal measurements: 10.3 x 6.0 x 5.3 cm = volume: 172 mL. Echogenicity within normal limits. No mass or hydronephrosis visualized. Bladder: Appears normal for degree of bladder distention. Bilateral ureteral jets are not noted, and the patient is not able to void at the time of the examination. Other: None. IMPRESSION: 1. Unremarkable sonographic appearance of the kidneys. No hydronephrosis. 2. Bilateral ureteral jets are not noted, and  the patient is not able to void at the time of the examination. Correlate for urinary retention. Electronically Signed   By: Eddie Candle M.D.   On: 01/16/2021 13:04   US Carotid Bilateral  Result Date: 01/16/2021 CLINICAL DATA:  Altered mental status, seizures EXAM: BILATERAL CAROTID DUPLEX ULTRASOUND TECHNIQUE: Pearline Cables scale imaging, color Doppler and duplex ultrasound were performed of bilateral carotid and vertebral arteries in the neck. COMPARISON:  None. FINDINGS: Criteria: Quantification of carotid stenosis is based on velocity parameters that correlate the residual internal carotid diameter with NASCET-based stenosis levels, using the diameter of the distal internal carotid lumen as the denominator for stenosis measurement. The following velocity measurements were obtained: RIGHT ICA: 127/43 cm/sec CCA: 193/79 cm/sec SYSTOLIC ICA/CCA RATIO:  0.9 ECA:  118 cm/sec LEFT ICA: 145/41 cm/sec CCA: 024/09 cm/sec SYSTOLIC ICA/CCA RATIO:  1.1 ECA:  107 cm/sec RIGHT CAROTID ARTERY: No significant atherosclerotic plaque or evidence of stenosis in the internal carotid artery. RIGHT VERTEBRAL ARTERY:  Patent with normal antegrade flow. LEFT CAROTID ARTERY: Trace smooth heterogeneous atherosclerotic plaque in the proximal internal carotid artery. By peak systolic velocity criteria in the region of plaque, the estimated stenosis is less than 50%. LEFT VERTEBRAL ARTERY:  Patent with normal antegrade flow. IMPRESSION: 1. Mild (1-49%) stenosis proximal left internal carotid artery secondary to trace smooth heterogeneous atherosclerotic plaque. 2. No significant atherosclerotic plaque or evidence of stenosis in the right internal carotid artery. 3. Vertebral arteries are patent with normal antegrade flow. Signed, Criselda Peaches, MD, [facility] Radiology Electronically Signed   By: Jacqulynn Cadet M.D.   On: 01/16/2021 11:00   DG CHEST PORT 1 VIEW  Result Date:  01/17/2021 CLINICAL DATA:  Severe sepsis.  Patient is COVID-19 positive. EXAM: PORTABLE CHEST 1 VIEW COMPARISON:  January 16, 2021 FINDINGS: Heart, hila, mediastinum are normal. No pneumothorax. No  nodules or masses. Minimal haziness in the left costophrenic angle, likely atelectasis. No suspicious infiltrates are identified. IMPRESSION: Minimal haziness in the left costophrenic angle is favored to represent atelectasis. No other abnormalities are identified. No suspicious infiltrates. Electronically Signed   By: Dorise Bullion III M.D.   On: 01/17/2021 08:13   DG Chest Port 1 View  Result Date: 01/16/2021 CLINICAL DATA:  Seizure. EXAM: PORTABLE CHEST 1 VIEW COMPARISON:  May 09, 2016 FINDINGS: The heart size and mediastinal contours are within normal limits. Both lungs are clear. The visualized skeletal structures are unremarkable. IMPRESSION: No active disease. Electronically Signed   By: Virgina Norfolk M.D.   On: 01/16/2021 01:02   DG Foot 2 Views Right  Result Date: 01/19/2021 CLINICAL DATA:  Recent fall with bruising EXAM: RIGHT FOOT - 2 VIEW COMPARISON:  None. FINDINGS: There is no evidence of fracture or dislocation. There is no evidence of arthropathy or other focal bone abnormality. Soft tissues are unremarkable. IMPRESSION: Negative. Electronically Signed   By: Jorje Guild M.D.   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 EEG to evaluate for seizure Level of alertness: Awake AEDs during EEG study: LEV, GBP Technical aspects: This EEG study was done with scalp electrodes positioned according to the 10-20 International system of electrode placement. Electrical  activity was acquired at a sampling rate of 500Hz  and reviewed with a high frequency filter of 70Hz  and a low frequency filter of 1Hz . EEG data were recorded continuously and digitally stored. Description: The posterior dominant rhythm consists of 9 Hz activity of moderate voltage (25-35 uV) seen predominantly in posterior head regions, symmetric and reactive to eye opening and eye closing. EEG showed intermittent generalized 5 to 6 Hz theta slowing. Hyperventilation and photic stimulation were not performed.   ABNORMALITY - Intermittent slow, generalized IMPRESSION: This study is suggestive of mild diffuse encephalopathy, nonspecific etiology. No seizures or epileptiform discharges were seen throughout the recording. Lora Havens   ECHOCARDIOGRAM COMPLETE  Result Date: 01/16/2021    ECHOCARDIOGRAM REPORT   Patient Name:   [name] Date of Exam: 01/16/2021 Medical Rec #:  [id number]     Height:       66.0 in Accession #:    [id number]    Weight:       145.0 lb Date of Birth:  Nov 17, 1970     BSA:          1.744 m Patient Age:    29 years      BP:           81/20 mmHg Patient Gender: F             HR:           111 bpm. Exam Location:  [name] Procedure: 2D Echo, Cardiac Doppler and Color Doppler Indications:    Syncope  History:        Patient has prior history of Echocardiogram examinations, most                 recent 07/11/2010. COPD, Signs/Symptoms:Dyspnea; Risk                 Factors:Tobacco abuse and Diabetes.  COVID +.  Sonographer:    Wenda Low Referring Phys: ASIA B [place]  1. Left ventricular ejection fraction, by estimation, is 70 to 75%. The left ventricle has hyperdynamic function. The left ventricle has no regional wall motion abnormalities. There is mild left ventricular hypertrophy. Left ventricular diastolic parameters are indeterminate.  2. Right ventricular systolic function is normal. The right ventricular size is normal. Tricuspid regurgitation signal is  inadequate for assessing PA pressure.  3. No right atrial or right ventricular collapse to suggest tamponade physiology. Respiratory variation in mitral outflow not assessed. Presence of sinus tachycardia does raise concern however for potential hemodynamic significance. Follow-up clinically  and suggest repeat limited echocardiogram within tht next 48 hours. Moderate pericardial effusion. The pericardial effusion is circumferential.  4. The mitral valve is grossly normal. Mild mitral valve regurgitation.  5. The aortic valve is tricuspid. Aortic valve regurgitation is not visualized. No aortic stenosis is present. Aortic valve mean gradient measures 6.0 mmHg.  6. The inferior vena cava is normal in size with <50% respiratory variability, suggesting right atrial pressure of 8 mmHg. Comparison(s): No prior Echocardiogram. FINDINGS  Left Ventricle: Left ventricular ejection fraction, by estimation, is 70 to 75%. The left ventricle has hyperdynamic function. The left ventricle has no regional wall motion abnormalities. The left ventricular internal cavity size was normal in size. There is mild left ventricular hypertrophy. Left ventricular diastolic parameters are indeterminate. Right Ventricle: The right ventricular size is normal. No increase in right ventricular wall thickness. Right ventricular systolic function is normal. Tricuspid regurgitation signal is inadequate for assessing PA pressure. Left Atrium: Left atrial size was normal in size. Right Atrium: Right atrial size was normal in size. Pericardium: No right atrial or right ventricular collapse to suggest tamponade physiology. Respiratory variation in mitral outflow not assessed. Presence of sinus tachycardia does raise concern however for potential hemodynamic significance. Follow-up clinically and suggest repeat limited echocardiogram within tht next 48 hours. A moderately sized pericardial effusion is present. The pericardial effusion is circumferential.  Mitral Valve: The mitral valve is grossly normal. Mild mitral valve regurgitation. MV peak gradient, 13.1 mmHg. The mean mitral valve gradient is 6.0 mmHg. Tricuspid Valve: The tricuspid valve is grossly normal. Tricuspid valve regurgitation is trivial. Aortic Valve: The aortic valve is tricuspid. Aortic valve regurgitation is not visualized. No aortic stenosis is present. Aortic valve mean gradient measures 6.0 mmHg. Aortic valve peak gradient measures 10.8 mmHg. Aortic valve area, by VTI measures 2.42  cm. Pulmonic Valve: The pulmonic valve was grossly normal. Pulmonic valve regurgitation is trivial. Aorta: The aortic root is normal in size and structure. Venous: The inferior vena cava is normal in size with less than 50% respiratory variability, suggesting right atrial pressure of 8 mmHg. IAS/Shunts: No atrial level shunt detected by color flow Doppler.  LEFT VENTRICLE PLAX 2D LVIDd:         3.70 cm  Diastology LVIDs:         2.40 cm  LV e' medial:    9.17 cm/s LV PW:         1.10 cm  LV E/e' medial:  11.2 LV IVS:        1.10 cm  LV e' lateral:   9.17 cm/s LVOT diam:     2.00 cm  LV E/e' lateral: 11.2 LV SV:         58 LV SV Index:   33 LVOT Area:     3.14 cm  RIGHT VENTRICLE RV Basal  diam:  2.30 cm RV Mid diam:    2.00 cm RV S prime:     27.70 cm/s TAPSE (M-mode): 3.3 cm LEFT ATRIUM             Index       RIGHT ATRIUM           Index LA diam:        3.30 cm 1.89 cm/m  RA Area:     10.80 cm LA Vol (A2C):   41.8 ml 23.96 ml/m RA Volume:   17.70 ml  10.15 ml/m LA Vol (A4C):   34.7 ml 19.89 ml/m LA Biplane Vol: 38.5 ml 22.07 ml/m  AORTIC VALVE AV Area (Vmax):    2.47 cm AV Area (Vmean):   2.20 cm AV Area (VTI):     2.42 cm AV Vmax:           164.00 cm/s AV Vmean:          109.000 cm/s AV VTI:            0.240 m AV Peak Grad:      10.8 mmHg AV Mean Grad:      6.0 mmHg LVOT Vmax:         129.00 cm/s LVOT Vmean:        76.400 cm/s LVOT VTI:          0.185 m LVOT/AV VTI ratio: 0.77  AORTA Ao Root diam: 3.20  cm MITRAL VALVE MV Area (PHT): 5.02 cm     SHUNTS MV Area VTI:   1.73 cm     Systemic VTI:  0.18 m MV Peak grad:  13.1 mmHg    Systemic Diam: 2.00 cm MV Mean grad:  6.0 mmHg MV Vmax:       1.81 m/s MV Vmean:      109.0 cm/s MV Decel Time: 151 msec MV E velocity: 103.00 cm/s MV A velocity: 168.00 cm/s MV E/A ratio:  0.61 Rozann Lesches MD Electronically signed by Rozann Lesches MD Signature Date/Time: 01/16/2021/4:08:15 PM    Final    ECHOCARDIOGRAM LIMITED  Result Date: 01/22/2021    ECHOCARDIOGRAM LIMITED REPORT   Patient Name:   [name] Date of Exam: 01/22/2021 Medical Rec #:  [id number]     Height:       66.0 in Accession #:    [id number]    Weight:       161.6 lb Date of Birth:  1970/10/30     BSA:          1.827 m Patient Age:    15 years      BP:           107/64 mmHg Patient Gender: F             HR:           88 bpm. Exam Location:  [name] Procedure: Limited Echo Indications:    Pericardial effusion  History:        Patient has prior history of Echocardiogram examinations, most                 recent 01/19/2021. COPD, Arrythmias:PVC, Signs/Symptoms:Chest                 Pain; Risk Factors:Diabetes. COVID +, Tobacco abuse, Sepsis.  Sonographer:    Wenda Low Referring Phys: [phone] DAVID TAT IMPRESSIONS  1. Left ventricular ejection fraction, by estimation, is 60 to 65%. The left ventricle has normal function.  2. Moderate pericardial effusion. The pericardial effusion is circumferential. There is no evidence of cardiac tamponade. Effusion is stable compared to the 01/19/21 study  3. The inferior vena cava is normal in size with greater than 50% respiratory variability, suggesting right atrial pressure of 3 mmHg.  4. Limited echo to evaluate pericardial effusion FINDINGS  Left Ventricle: Left ventricular ejection fraction, by estimation, is 60 to 65%. The left ventricle has normal function. Pericardium: A moderately sized pericardial effusion is present. The pericardial effusion is  circumferential. There is no evidence of cardiac tamponade. Venous: The inferior vena cava is normal in size with greater than 50% respiratory variability, suggesting right atrial pressure of 3 mmHg. LEFT VENTRICLE PLAX 2D LVIDd:         3.20 cm LVIDs:         2.40 cm LV PW:         1.20 cm LV IVS:        0.90 cm LVOT diam:     2.10 cm LVOT Area:     3.46 cm  LEFT ATRIUM         Index LA diam:    3.20 cm 1.75 cm/m   AORTA Ao Root diam: 3.40 cm  SHUNTS Systemic Diam: 2.10 cm Carlyle Dolly MD Electronically signed by Carlyle Dolly MD Signature Date/Time: 01/22/2021/11:50:46 AM    Final    ECHOCARDIOGRAM LIMITED  Result Date: 01/19/2021    ECHOCARDIOGRAM LIMITED REPORT   Patient Name:   [name] Date of Exam: 01/19/2021 Medical Rec #:  [id number]     Height:       66.0 in Accession #:    [id number]    Weight:       161.6 lb Date of Birth:  01/19/1971     BSA:          1.827 m Patient Age:    30 years      BP:           99/68 mmHg Patient Gender: F             HR:           96 bpm. [facility]am Location:  [name] Procedure: Limited Echo Indications:    Pericardial effusion  History:        Patient has prior history of Echocardiogram examinations, most                 recent 01/16/2021. COPD, Arrythmias:PVC, Signs/Symptoms:Chest                 Pain; Risk Factors:Diabetes. COVID +, Tobacco Abuse, Sepsis,                 Pericardial effusion.  Sonographer:    Wenda Low Referring Phys: [place]  1. Left ventricular ejection fraction, by estimation, is 60 to 65%. The left ventricle has normal function.  2. Moderate pericardial effusion. The pericardial effusion is circumferential. There is no evidence of cardiac tamponade. Effusion looks larger compared to the 01/16/2021 study however remains overall moderate by measurements. Recommend repeating limited  study in 72 hours. FINDINGS  Left Ventricle: Left ventricular ejection fraction, by estimation, is 60 to 65%. The left ventricle has  normal function. Pericardium: A moderately sized pericardial effusion is present. The pericardial effusion is circumferential. There is no evidence of cardiac tamponade. LEFT VENTRICLE PLAX 2D LVIDd:         3.90 cm LVIDs:  2.40 cm LV PW:         1.00 cm LV IVS:        1.00 cm LVOT diam:     2.00 cm LVOT Area:     3.14 cm  LEFT ATRIUM         Index LA diam:    3.40 cm 1.86 cm/m   SHUNTS Systemic Diam: 2.00 cm Carlyle Dolly MD Electronically signed by Carlyle Dolly MD Signature Date/Time: 01/19/2021/11:20:45 AM    Final    [place] EKG SITE RITE  Result Date: 01/16/2021 If Site Rite image not attached, placement could not be confirmed due to current cardiac rhythm. [2 weeks]   Assessment:  50 year old female with reported history of Crohn's disease diagnosed in 1998 at [facility], numerous studies including colonoscopies and imaging since then without evidence of active Crohn's, patient denies ever being on treatment, presented to the emergency department with chief complaint of rectal bleeding for 1 week.  When she was hospitalized in September CT imaging at that time revealed colitis extending from splenic flexure to the rectum, she was treated empirically with antibiotics with resolution of diarrhea.  She was never able to complete stool studies.  She also had COVID at that time.  CT this admission with slight improvement but persistent distal colitis.   Colitis: Flexible sigmoidoscopy this admission revealed severe inflammation involving the rectum and sigmoid colon secondary to proctosigmoid colitis.  She also had tight stricture at 15 cm from the anal verge.  Unable to pass pediatric colonoscope.  Pathology revealed granulation tissue with marked acute inflammation.  Although endoscopically appearance more consistent with UC, high-grade stricture or currently seen with Crohn's disease.   Case was discussed with [facility] for possible inpatient transfer earlier this week but they recommended  continuing IV steroids to decrease inflammation prior to any consideration of surgical intervention.She has been on IV Solu-Medrol 60 mg daily (7th day) and was doing better yesterday.  She ate solid food brought in and seemed to tolerate but overnight she reports having 7 loose bloody stools and worsening abdominal pain in the lower abdomen, particularly right lower quadrant.  Sed rate mildly elevated, CRP remains normal. WBC normal.   She will need biologic. Her Hep B serologies are negative for chronic infection. Her TB gold is still pending.   Cannot rule out need for surgical intervention of sigmoid colon stricture if fails to respond.    Plan: Recommend soft diet due to stricture.  Continue IV solumedrol. She may require infliximab inpatient if does not improve.  Continue to monitor CBC.  If worsening abdominal pain, would consider updating imaging. Repeat flex sig in 3-4 weeks as outpatient.   Christine Ochs. Bernarda Caffey [facility] [id number] 10/13/202211:20 AM    LOS: 9 days

## 2021-02-13 DIAGNOSIS — K921 Melena: Secondary | ICD-10-CM | POA: Diagnosis not present

## 2021-02-13 DIAGNOSIS — J449 Chronic obstructive pulmonary disease, unspecified: Secondary | ICD-10-CM | POA: Diagnosis not present

## 2021-02-13 DIAGNOSIS — K529 Noninfective gastroenteritis and colitis, unspecified: Secondary | ICD-10-CM | POA: Diagnosis not present

## 2021-02-13 DIAGNOSIS — K56699 Other intestinal obstruction unspecified as to partial versus complete obstruction: Secondary | ICD-10-CM | POA: Diagnosis not present

## 2021-02-13 LAB — CBC
HCT: 37.5 % (ref 36.0–46.0)
Hemoglobin: 12.2 g/dL (ref 12.0–15.0)
MCH: 31.5 pg (ref 26.0–34.0)
MCHC: 32.5 g/dL (ref 30.0–36.0)
MCV: 96.9 fL (ref 80.0–100.0)
Platelets: 391 10*3/uL (ref 150–400)
RBC: 3.87 MIL/uL (ref 3.87–5.11)
RDW: 14 % (ref 11.5–15.5)
WBC: 5.4 10*3/uL (ref 4.0–10.5)
nRBC: 0 % (ref 0.0–0.2)

## 2021-02-13 LAB — QUANTIFERON-TB GOLD PLUS (RQFGPL)
QuantiFERON Mitogen Value: 3.05 IU/mL
QuantiFERON Nil Value: 0.04 IU/mL
QuantiFERON TB1 Ag Value: 0.03 IU/mL
QuantiFERON TB2 Ag Value: 0.03 IU/mL

## 2021-02-13 LAB — BASIC METABOLIC PANEL
Anion gap: 9 (ref 5–15)
BUN: 11 mg/dL (ref 6–20)
CO2: 28 mmol/L (ref 22–32)
Calcium: 8.9 mg/dL (ref 8.9–10.3)
Chloride: 101 mmol/L (ref 98–111)
Creatinine, Ser: 0.82 mg/dL (ref 0.44–1.00)
GFR, Estimated: >60 mL/min (ref >60)
Glucose, Bld: 88 mg/dL (ref 70–99)
Potassium: 3.2 mmol/L — ABNORMAL LOW (ref 3.5–5.1)
Sodium: 138 mmol/L (ref 135–145)

## 2021-02-13 LAB — QUANTIFERON-TB GOLD PLUS: QuantiFERON-TB Gold Plus: NEGATIVE

## 2021-02-13 MED ORDER — AMITRIPTYLINE HCL 25 MG PO TABS: 75.0000 mg | ORAL_TABLET | Freq: Every day | ORAL | Status: DC

## 2021-02-13 MED ORDER — POTASSIUM CHLORIDE CRYS ER 20 MEQ PO TBCR
40.0000 meq | EXTENDED_RELEASE_TABLET | Freq: Once | ORAL | Status: AC
  Administered 2021-02-13: 40 meq via ORAL
  Filled 2021-02-13: qty 2

## 2021-02-13 MED ORDER — CYCLOBENZAPRINE HCL 10 MG PO TABS: 5.0000 mg | ORAL_TABLET | Freq: Three times a day (TID) | ORAL | Status: DC | PRN

## 2021-02-13 MED ORDER — CYCLOBENZAPRINE HCL 10 MG PO TABS
10.0000 mg | ORAL_TABLET | Freq: Three times a day (TID) | ORAL | Status: DC | PRN
  Administered 2021-02-13 – 2021-02-22 (×19): 10 mg via ORAL
  Filled 2021-02-13 (×19): qty 1

## 2021-02-13 NOTE — Progress Notes (Signed)
Subjective:  States she woke up at 3am with blood in the bed. States it is coming from her rectum. Thought she had to have diarrhea and it was all blood. Continues to have lower abdominal pain, mostly RLQ but better than yesterday. No vomiting. Did not like food that was brought, chicken tenders, french fries.   Objective: Vital signs in last 24 hours: Temp:  [97.9 F (36.6 C)-98.6 F (37 C)] 98.6 F (37 C) (10/14 1343) Pulse Rate:  [75-80] 80 (10/14 1343) Resp:  [16-17] 17 (10/14 1343) BP: (109-115)/(64-66) 115/65 (10/14 1343) SpO2:  [96 %-97 %] 97 % (10/14 1343) Last BM Date: 02/11/21 General:   Alert,  Well-developed, well-nourished, pleasant and cooperative in NAD Head:  Normocephalic and atraumatic. Eyes:  Sclera clear, no icterus.  Abdomen:  Soft, nondistended. Normal bowel sounds, without guarding, and without rebound.  Mild lower/rlq tenderness Extremities:  Without clubbing, deformity or edema. Neurologic:  Alert and  oriented x4;  grossly normal neurologically. Skin:  Intact without significant lesions or rashes. Psych:  Alert and cooperative. Normal mood and affect.  Intake/Output from previous day: 10/13 0701 - 10/14 0700 In: 952 [P.O.:952] Out: -  Intake/Output this shift: Total I/O In: 480 [P.O.:480] Out: -   Lab Results: CBC Recent Labs    02/11/21 0549 02/12/21 0601 02/13/21 0755  WBC 5.4 5.5 5.4  HGB 10.7* 10.3* 12.2  HCT 33.5* 32.4* 37.5  MCV 96.0 94.5 96.9  PLT 250 388 391   BMET Recent Labs    02/11/21 0549 02/12/21 0601 02/13/21 0755  NA 138 138 138  K 3.0* 3.5 3.2*  CL 104 103 101  CO2 26 27 28   GLUCOSE 94 86 88  BUN 8 10 11   CREATININE 0.66 0.76 0.82  CALCIUM 8.9 8.9 8.9   LFTs No results for input(s): BILITOT, BILIDIR, IBILI, ALKPHOS, AST, ALT, PROT, ALBUMIN in the last 72 hours. No results for input(s): LIPASE in the last 72 hours. PT/INR No results for input(s): LABPROT, INR in the last 72 hours.    Imaging Studies: CT  HEAD WO CONTRAST (5MM)  Result Date: 01/16/2021 CLINICAL DATA:  Status post seizure. EXAM: CT HEAD WITHOUT CONTRAST TECHNIQUE: Contiguous axial images were obtained from the base of the skull through the vertex without intravenous contrast. COMPARISON:  June 03, 2020 FINDINGS: Brain: No evidence of acute infarction, hemorrhage, hydrocephalus, extra-axial collection or mass lesion/mass effect. Vascular: No hyperdense vessel or unexpected calcification. Skull: Normal. Negative for fracture or focal lesion. Sinuses/Orbits: A small right maxillary sinus air-fluid level is seen. Other: None. IMPRESSION: 1. No acute intracranial abnormality. Electronically Signed   By: Virgina Norfolk M.D.   On: 01/16/2021 01:08   CT ABDOMEN PELVIS W CONTRAST  Result Date: 02/03/2021 CLINICAL DATA:  Abdominal abscess/infection suspected. Rectal bleeding for 7 days. Denies pain. History of COPD, HTN, DM, hyster, GB, oophorectomy EXAM: CT ABDOMEN AND PELVIS WITH CONTRAST TECHNIQUE: Multidetector CT imaging of the abdomen and pelvis was performed using the standard protocol following bolus administration of intravenous contrast. CONTRAST:  150m OMNIPAQUE IOHEXOL 300 MG/ML  SOLN COMPARISON:  CT abdomen pelvis 01/21/2021 FINDINGS: Lower chest: Partially visualized slightly improved trace pericardial effusion. Resolution of bilateral pleural effusions. No acute abnormality. Hepatobiliary: Focally hypodensity of the hepatic parenchyma along the falciform ligament likely represents focal fatty infiltration. No focal liver abnormality. Status post cholecystectomy. No biliary dilatation. Pancreas: No focal lesion. Normal pancreatic contour. No surrounding inflammatory changes. No main pancreatic ductal dilatation. Spleen: Normal in size without  focal abnormality. Adrenals/Urinary Tract: No adrenal nodule bilaterally. Bilateral kidneys enhance symmetrically. No hydronephrosis. No hydroureter. The urinary bladder is unremarkable.  Resolution of gas associated with the urinary bladder. On delayed imaging, there is no urothelial wall thickening and there are no filling defects in the opacified portions of the bilateral collecting systems or ureters. Stomach/Bowel: Stomach is within normal limits. No evidence of small bowel wall thickening or dilatation. Slightly improved but persistent bowel wall thickening, submucosal hyperemia, pericolonic fat stranding of the descending colon and rectosigmoid colon. Appendix appears normal. Vascular/Lymphatic: No abdominal aorta or iliac aneurysm. Mild noncalcified atherosclerotic plaque of the aorta and its branches. No abdominal, pelvic, or inguinal lymphadenopathy. Reproductive: Status post hysterectomy. No adnexal masses. Other: No intraperitoneal free fluid. No intraperitoneal free gas. No organized fluid collection. Musculoskeletal: No abdominal wall hernia or abnormality. No suspicious lytic or blastic osseous lesions. No acute displaced fracture. Multilevel degenerative changes of the spine. IMPRESSION: 1. Slightly improved but persistent distal colitis. 2. Partially visualized slightly improved trace pericardial effusion. Resolution of bilateral pleural effusions. 3.  Aortic Atherosclerosis (ICD10-I70.0). Electronically Signed   By: Iven Finn M.D.   On: 02/03/2021 15:12   CT ABDOMEN PELVIS W CONTRAST  Result Date: 01/21/2021 CLINICAL DATA:  Abdominal pain, acute nonlocalized fever, leukocytosis. Abdominal abscess/infection suspected. COVID positive. History of COPD, collagen vascular disease and Crohn's disease. EXAM: CT ABDOMEN AND PELVIS WITH CONTRAST TECHNIQUE: Multidetector CT imaging of the abdomen and pelvis was performed using the standard protocol following bolus administration of intravenous contrast. CONTRAST:  47m OMNIPAQUE IOHEXOL 350 MG/ML SOLN COMPARISON:  CT 09/14/2015. FINDINGS: Lower chest: New small to moderate dependent pleural effusions with associated bibasilar  atelectasis at both lung bases. There is a small pericardial effusion as well. The heart size is normal. No acute vascular findings are evident. Hepatobiliary: The liver is normal in density without suspicious focal abnormality. No significant biliary dilatation status post cholecystectomy. Pancreas: Unremarkable. No pancreatic ductal dilatation or surrounding inflammatory changes. Spleen: Normal in size without focal abnormality. Adrenals/Urinary Tract: Both adrenal glands appear normal. Both kidneys appear normal. No evidence of urinary tract calculus, hydronephrosis or perinephric soft tissue stranding. The urinary bladder is moderately distended. There is a small amount of air within the bladder lumen. No bladder wall thickening or immediate surrounding inflammatory changes identified. Stomach/Bowel: Enteric contrast was administered and has passed into the mid small bowel. The stomach appears normal for its degree of distention. There is no significant small bowel distension, wall thickening or surrounding inflammation. By report, the patient is status post appendectomy. The proximal colon is fluid-filled and mildly distended. There is diffuse wall thickening of the colon extending from the splenic flexure to the rectum. Wall thickening and enhancement are most prominent within the proximal rectum. Distally, the rectal walls are less well-defined, and distal rectal perforation is difficult to exclude. There is moderate perirectal soft tissue stranding. These inflammatory changes do not appear to directly abut the posterior wall of the bladder. No discrete fistula are seen. Vascular/Lymphatic: There are no enlarged abdominal or pelvic lymph nodes. No acute vascular findings. The portal, superior mesenteric and splenic veins are patent. Reproductive: Hysterectomy. No adnexal mass. There is no gas within the vagina. Other: Small amount of pelvic ascites. As above, possible perirectal fluid without drainable fluid  collection or fistula. No other extraluminal fluid collections are identified. There is no free air. Musculoskeletal: No acute or significant osseous findings. No evidence of discitis or sacroiliitis. IMPRESSION: 1. Findings are consistent with  distal colitis, extending from the splenic flexure of the colon into the rectum. This colitis is presumably secondary to the patient's Crohn's disease. 2. The walls of the distal rectum are ill-defined, and rectal perforation difficult to exclude. No drainable fluid collection identified. 3. Gas within the wall of the bladder without bladder wall thickening or immediate surrounding inflammatory changes. The gas could be iatrogenic, although if catheterization has not been recently performed, a colovesical fistula would be a consideration. 4. No evidence of small-bowel inflammation or obstruction. 5. Bilateral pleural effusions with small pericardial effusion and bibasilar atelectasis. 6. These results will be called to the ordering clinician or representative by the Radiologist Assistant, and communication documented in the PACS or [employer]. Electronically Signed   By: Richardean Sale M.D.   On: 01/21/2021 16:45   US RENAL  Result Date: 01/16/2021 CLINICAL DATA:  Acute kidney injury EXAM: RENAL / URINARY TRACT ULTRASOUND COMPLETE COMPARISON:  None. FINDINGS: Right Kidney: Renal measurements: 10.7 x 5.0 x 5.5 cm = volume: 156 mL. Echogenicity within normal limits. No mass or hydronephrosis visualized. Left Kidney: Renal measurements: 10.3 x 6.0 x 5.3 cm = volume: 172 mL. Echogenicity within normal limits. No mass or hydronephrosis visualized. Bladder: Appears normal for degree of bladder distention. Bilateral ureteral jets are not noted, and the patient is not able to void at the time of the examination. Other: None. IMPRESSION: 1. Unremarkable sonographic appearance of the kidneys. No hydronephrosis. 2. Bilateral ureteral jets are not noted, and the patient is not  able to void at the time of the examination. Correlate for urinary retention. Electronically Signed   By: Eddie Candle M.D.   On: 01/16/2021 13:04   US Carotid Bilateral  Result Date: 01/16/2021 CLINICAL DATA:  Altered mental status, seizures EXAM: BILATERAL CAROTID DUPLEX ULTRASOUND TECHNIQUE: Pearline Cables scale imaging, color Doppler and duplex ultrasound were performed of bilateral carotid and vertebral arteries in the neck. COMPARISON:  None. FINDINGS: Criteria: Quantification of carotid stenosis is based on velocity parameters that correlate the residual internal carotid diameter with NASCET-based stenosis levels, using the diameter of the distal internal carotid lumen as the denominator for stenosis measurement. The following velocity measurements were obtained: RIGHT ICA: 127/43 cm/sec CCA: 454/09 cm/sec SYSTOLIC ICA/CCA RATIO:  0.9 ECA:  118 cm/sec LEFT ICA: 145/41 cm/sec CCA: 811/91 cm/sec SYSTOLIC ICA/CCA RATIO:  1.1 ECA:  107 cm/sec RIGHT CAROTID ARTERY: No significant atherosclerotic plaque or evidence of stenosis in the internal carotid artery. RIGHT VERTEBRAL ARTERY:  Patent with normal antegrade flow. LEFT CAROTID ARTERY: Trace smooth heterogeneous atherosclerotic plaque in the proximal internal carotid artery. By peak systolic velocity criteria in the region of plaque, the estimated stenosis is less than 50%. LEFT VERTEBRAL ARTERY:  Patent with normal antegrade flow. IMPRESSION: 1. Mild (1-49%) stenosis proximal left internal carotid artery secondary to trace smooth heterogeneous atherosclerotic plaque. 2. No significant atherosclerotic plaque or evidence of stenosis in the right internal carotid artery. 3. Vertebral arteries are patent with normal antegrade flow. Signed, Criselda Peaches, MD, [facility] Electronically Signed   By: Jacqulynn Cadet M.D.   On: 01/16/2021 11:00   DG CHEST PORT 1 VIEW  Result Date: 01/17/2021 CLINICAL  DATA:  Severe sepsis.  Patient is COVID-19 positive. EXAM: PORTABLE CHEST 1 VIEW COMPARISON:  January 16, 2021 FINDINGS: Heart, hila, mediastinum are normal. No pneumothorax. No nodules or masses. Minimal haziness in the left costophrenic angle, likely atelectasis. No suspicious infiltrates are identified. IMPRESSION:  Minimal haziness in the left costophrenic angle is favored to represent atelectasis. No other abnormalities are identified. No suspicious infiltrates. Electronically Signed   By: Dorise Bullion III M.D.   On: 01/17/2021 08:13   DG Chest Port 1 View  Result Date: 01/16/2021 CLINICAL DATA:  Seizure. EXAM: PORTABLE CHEST 1 VIEW COMPARISON:  May 09, 2016 FINDINGS: The heart size and mediastinal contours are within normal limits. Both lungs are clear. The visualized skeletal structures are unremarkable. IMPRESSION: No active disease. Electronically Signed   By: Virgina Norfolk M.D.   On: 01/16/2021 01:02   DG Foot 2 Views Right  Result Date: 01/19/2021 CLINICAL DATA:  Recent fall with bruising EXAM: RIGHT FOOT - 2 VIEW COMPARISON:  None. FINDINGS: There is no evidence of fracture or dislocation. There is no evidence of arthropathy or other focal bone abnormality. Soft tissues are unremarkable. IMPRESSION: Negative. Electronically Signed   By: Jorje Guild M.D.   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 EEG to evaluate for seizure Level of alertness: Awake AEDs during EEG study: LEV, GBP Technical aspects: This EEG study was done with scalp electrodes positioned according to the 10-20 International system of electrode placement. Electrical activity was acquired  at a sampling rate of 500Hz  and reviewed with a high frequency filter of 70Hz  and a low frequency filter of 1Hz . EEG data were recorded continuously and digitally stored. Description: The posterior dominant rhythm consists of 9 Hz activity of moderate voltage (25-35 uV) seen predominantly in posterior head regions, symmetric and reactive to eye opening and eye closing. EEG showed intermittent generalized 5 to 6 Hz theta slowing. Hyperventilation and photic stimulation were not performed.   ABNORMALITY - Intermittent slow, generalized IMPRESSION: This study is suggestive of mild diffuse encephalopathy, nonspecific etiology. No seizures or epileptiform discharges were seen throughout the recording. Lora Havens   ECHOCARDIOGRAM COMPLETE  Result Date: 01/16/2021    ECHOCARDIOGRAM REPORT   Patient Name:   ANGELE WIEMANN Date of Exam: 01/16/2021 Medical Rec #:  [id number]     Height:       66.0 in Accession #:    [id number]    Weight:       145.0 lb Date of Birth:  1971/04/25     BSA:          1.744 m Patient Age:    23 years      BP:           81/20 mmHg Patient Gender: F             HR:           111 bpm. Exam Location:  [name] Procedure: 2D Echo, Cardiac Doppler and Color Doppler Indications:    Syncope  History:        Patient has prior history of Echocardiogram examinations, most                 recent 07/11/2010. COPD, Signs/Symptoms:Dyspnea; Risk                 Factors:Tobacco abuse and Diabetes. COVID +.  Sonographer:    Wenda Low Referring Phys: ASIA B Ponder  1. Left  ventricular ejection fraction, by estimation, is 70 to 75%. The left ventricle has hyperdynamic function. The left ventricle has no regional wall motion abnormalities. There is mild left ventricular hypertrophy. Left ventricular diastolic parameters are indeterminate.  2. Right ventricular systolic function is normal. The right ventricular size is normal. Tricuspid regurgitation signal is inadequate for assessing  PA pressure.  3. No right atrial or right ventricular collapse to suggest tamponade physiology. Respiratory variation in mitral outflow not assessed. Presence of sinus tachycardia does raise concern however for potential hemodynamic significance. Follow-up clinically  and suggest repeat limited echocardiogram within tht next 48 hours. Moderate pericardial effusion. The pericardial effusion is circumferential.  4. The mitral valve is grossly normal. Mild mitral valve regurgitation.  5. The aortic valve is tricuspid. Aortic valve regurgitation is not visualized. No aortic stenosis is present. Aortic valve mean gradient measures 6.0 mmHg.  6. The inferior vena cava is normal in size with <50% respiratory variability, suggesting right atrial pressure of 8 mmHg. Comparison(s): No prior Echocardiogram. FINDINGS  Left Ventricle: Left ventricular ejection fraction, by estimation, is 70 to 75%. The left ventricle has hyperdynamic function. The left ventricle has no regional wall motion abnormalities. The left ventricular internal cavity size was normal in size. There is mild left ventricular hypertrophy. Left ventricular diastolic parameters are indeterminate. Right Ventricle: The right ventricular size is normal. No increase in right ventricular wall thickness. Right ventricular systolic function is normal. Tricuspid regurgitation signal is inadequate for assessing PA pressure. Left Atrium: Left atrial size was normal in size. Right Atrium: Right atrial size was normal in size. Pericardium: No right atrial or right ventricular collapse to suggest tamponade physiology. Respiratory variation in mitral outflow not assessed. Presence of sinus tachycardia does raise concern however for potential hemodynamic significance. Follow-up clinically and suggest repeat limited echocardiogram within tht next 48 hours. A moderately sized pericardial effusion is present. The pericardial effusion is circumferential. Mitral Valve: The mitral  valve is grossly normal. Mild mitral valve regurgitation. MV peak gradient, 13.1 mmHg. The mean mitral valve gradient is 6.0 mmHg. Tricuspid Valve: The tricuspid valve is grossly normal. Tricuspid valve regurgitation is trivial. Aortic Valve: The aortic valve is tricuspid. Aortic valve regurgitation is not visualized. No aortic stenosis is present. Aortic valve mean gradient measures 6.0 mmHg. Aortic valve peak gradient measures 10.8 mmHg. Aortic valve area, by VTI measures 2.42  cm. Pulmonic Valve: The pulmonic valve was grossly normal. Pulmonic valve regurgitation is trivial. Aorta: The aortic root is normal in size and structure. Venous: The inferior vena cava is normal in size with less than 50% respiratory variability, suggesting right atrial pressure of 8 mmHg. IAS/Shunts: No atrial level shunt detected by color flow Doppler.  LEFT VENTRICLE PLAX 2D LVIDd:         3.70 cm  Diastology LVIDs:         2.40 cm  LV e' medial:    9.17 cm/s LV PW:         1.10 cm  LV E/e' medial:  11.2 LV IVS:        1.10 cm  LV e' lateral:   9.17 cm/s LVOT diam:     2.00 cm  LV E/e' lateral: 11.2 LV SV:         58 LV SV Index:   33 LVOT Area:     3.14 cm  RIGHT VENTRICLE RV Basal diam:  2.30 cm RV Mid diam:    2.00 cm RV S prime:  27.70 cm/s TAPSE (M-mode): 3.3 cm LEFT ATRIUM             Index       RIGHT ATRIUM           Index LA diam:        3.30 cm 1.89 cm/m  RA Area:     10.80 cm LA Vol (A2C):   41.8 ml 23.96 ml/m RA Volume:   17.70 ml  10.15 ml/m LA Vol (A4C):   34.7 ml 19.89 ml/m LA Biplane Vol: 38.5 ml 22.07 ml/m  AORTIC VALVE AV Area (Vmax):    2.47 cm AV Area (Vmean):   2.20 cm AV Area (VTI):     2.42 cm AV Vmax:           164.00 cm/s AV Vmean:          109.000 cm/s AV VTI:            0.240 m AV Peak Grad:      10.8 mmHg AV Mean Grad:      6.0 mmHg LVOT Vmax:         129.00 cm/s LVOT Vmean:        76.400 cm/s LVOT VTI:          0.185 m LVOT/AV VTI ratio: 0.77  AORTA Ao Root diam: 3.20 cm MITRAL VALVE MV Area  (PHT): 5.02 cm     SHUNTS MV Area VTI:   1.73 cm     Systemic VTI:  0.18 m MV Peak grad:  13.1 mmHg    Systemic Diam: 2.00 cm MV Mean grad:  6.0 mmHg MV Vmax:       1.81 m/s MV Vmean:      109.0 cm/s MV Decel Time: 151 msec MV E velocity: 103.00 cm/s MV A velocity: 168.00 cm/s MV E/A ratio:  0.61 Rozann Lesches MD Electronically signed by Rozann Lesches MD Signature Date/Time: 01/16/2021/4:08:15 PM    Final    ECHOCARDIOGRAM LIMITED  Result Date: 01/22/2021    ECHOCARDIOGRAM LIMITED REPORT   Patient Name:   CHRISTELLA APP Date of Exam: 01/22/2021 Medical Rec #:  [id number]     Height:       66.0 in Accession #:    [id number]    Weight:       161.6 lb Date of Birth:  August 06, 1970     BSA:          1.827 m Patient Age:    56 years      BP:           107/64 mmHg Patient Gender: F             HR:           88 bpm. Exam Location:  [name] Procedure: Limited Echo Indications:    Pericardial effusion  History:        Patient has prior history of Echocardiogram examinations, most                 recent 01/19/2021. COPD, Arrythmias:PVC, Signs/Symptoms:Chest                 Pain; Risk Factors:Diabetes. COVID +, Tobacco abuse, Sepsis.  Sonographer:    Wenda Low Referring Phys: [id number] DAVID TAT IMPRESSIONS  1. Left ventricular ejection fraction, by estimation, is 60 to 65%. The left ventricle has normal function.  2. Moderate pericardial effusion. The pericardial effusion is circumferential. There is no evidence of cardiac tamponade. Effusion is stable  compared to the 01/19/21 study  3. The inferior vena cava is normal in size with greater than 50% respiratory variability, suggesting right atrial pressure of 3 mmHg.  4. Limited echo to evaluate pericardial effusion FINDINGS  Left Ventricle: Left ventricular ejection fraction, by estimation, is 60 to 65%. The left ventricle has normal function. Pericardium: A moderately sized pericardial effusion is present. The pericardial effusion is circumferential. There is no  evidence of cardiac tamponade. Venous: The inferior vena cava is normal in size with greater than 50% respiratory variability, suggesting right atrial pressure of 3 mmHg. LEFT VENTRICLE PLAX 2D LVIDd:         3.20 cm LVIDs:         2.40 cm LV PW:         1.20 cm LV IVS:        0.90 cm LVOT diam:     2.10 cm LVOT Area:     3.46 cm  LEFT ATRIUM         Index LA diam:    3.20 cm 1.75 cm/m   AORTA Ao Root diam: 3.40 cm  SHUNTS Systemic Diam: 2.10 cm Carlyle Dolly MD Electronically signed by Carlyle Dolly MD Signature Date/Time: 01/22/2021/11:50:46 AM    Final    ECHOCARDIOGRAM LIMITED  Result Date: 01/19/2021    ECHOCARDIOGRAM LIMITED REPORT   Patient Name:   [facility] Date of Exam: 01/19/2021 Medical Rec #:  [id number]     Height:       66.0 in Accession #:    [id number]    Weight:       161.6 lb Date of Birth:  [birth year]     BSA:          1.827 m Patient Age:    89 years      BP:           99/68 mmHg Patient Gender: F             HR:           96 bpm. Exam Location:  [name] Procedure: Limited Echo Indications:    Pericardial effusion  History:        Patient has prior history of Echocardiogram examinations, most                 recent 01/16/2021. COPD, Arrythmias:PVC, Signs/Symptoms:Chest                 Pain; Risk Factors:Diabetes. COVID +, Tobacco Abuse, Sepsis,                 Pericardial effusion.  Sonographer:    Wenda Low Referring Phys: [place]  1. Left ventricular ejection fraction, by estimation, is 60 to 65%. The left ventricle has normal function.  2. Moderate pericardial effusion. The pericardial effusion is circumferential. There is no evidence of cardiac tamponade. Effusion looks larger compared to the 01/16/2021 study however remains overall moderate by measurements. Recommend repeating limited  study in 72 hours. FINDINGS  Left Ventricle: Left ventricular ejection fraction, by estimation, is 60 to 65%. The left ventricle has normal function. Pericardium:  A moderately sized pericardial effusion is present. The pericardial effusion is circumferential. There is no evidence of cardiac tamponade. LEFT VENTRICLE PLAX 2D LVIDd:         3.90 cm LVIDs:         2.40 cm LV PW:         1.00 cm LV IVS:  1.00 cm LVOT diam:     2.00 cm LVOT Area:     3.14 cm  LEFT ATRIUM         Index LA diam:    3.40 cm 1.86 cm/m   SHUNTS Systemic Diam: 2.00 cm Carlyle Dolly MD Electronically signed by Carlyle Dolly MD Signature Date/Time: 01/19/2021/11:20:45 AM    Final    [place] EKG SITE RITE  Result Date: 01/16/2021 If Site Rite image not attached, placement could not be confirmed due to current cardiac rhythm. [2 weeks]   Assessment: 50 year old female with reported history of Crohn's disease diagnosed in 1998 at [facility], numerous studies including colonoscopies and imaging since then without evidence of active Crohn's, patient denies ever being on treatment, presented to the emergency department with chief complaint of rectal bleeding for 1 week.  When she was hospitalized in September CT imaging at that time revealed colitis extending from splenic flexure to the rectum, she was treated empirically with antibiotics with resolution of diarrhea.  She was never able to complete stool studies.  She also had COVID at that time.  CT this admission with slight improvement but persistent distal colitis.   Colitis: Flexible sigmoidoscopy this admission revealed severe inflammation involving the rectum and sigmoid colon secondary to proctosigmoid colitis.  She also had tight stricture at 15 cm from the anal verge.  Unable to pass pediatric colonoscope.  Pathology revealed granulation tissue with marked acute inflammation.  Although endoscopically appearance more consistent with UC, high-grade stricture or currently seen with Crohn's disease. Continues to complains of abdominal pain and frequent blood per rectum. Today is day 8 of IV Solu-Medrol. TB Gold pending.    Plan: Dr.  Jenetta Downer to reach out to [place] today to discuss care.   Laureen Ochs. Bernarda Caffey [facility] [phone] 10/14/20222:45 PM    LOS: 10 days

## 2021-02-13 NOTE — Progress Notes (Signed)
PROGRESS NOTE   [name]  ERD:[id number] DOB: 02/27/1971 DOA: 02/03/2021 PCP: Teodoro Kil, PA-C   Chief Complaint  Patient presents with   Rectal Bleeding   Level of care: Med-Surg  Brief Admission History:  50 year old female with a history of: anxiety, asthma, chronic low back pain, collagen vascular disease, copd, chrons, gerd, htn, ibs, dm, and a seizure disorder. She presented with hematochezia, nausea, and vomiting. She had been recently admitted 01/16/21-01/24/21 due to sepsis secondary to colitis. She had been discharged on PO antibiotics, did not comply with her antibiotics, and has returned with colitis symptoms. GI has been consulted. She reports that she has had numerous loose bowel movements in the preceding days, reports 10/14 she has improved RLQ pain, but continues with blood with bowel movement and wiping. She denies current nausea and vomiting. Currently continuing IV steroids. GI plan is to complete TB test, start biologic if appropriate, refer to outpatient multidisciplinary IBD clinic.   Assessment & Plan:    Principal Problem:   Colitis -10/7 flex sig--Diffuse severe inflammation characterized by erosions, erythema and shallow ulcerations was found in the rectum and in the sigmoid colon extending up to approx 15 cm from the anal verge -10/7 discussed with GI, Dr. Carver--d/c abx, start IV solumedrol for presumptive IBD flare -10/10--discussed case with [facility]--Dr. Atilano Ina transfer; recommends referral to outpt multidisciplinary IBD clinic, reduce current inflammation prior to any surgical interventions.   -currently on 11m solu-medrol qd -GI has ben consulted, per GI, she will need biologic. Her Hep B serologies are negative for chronic infection. Her TB gold is still pending.  -repeat imaging if her abdominal pain persists or worsens -Gi wishes for outpatient flex-sig in 3-4 weeks -restarting home flexeril and elavil to augment pain control    Active Problems:    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[place] -would prefer soft diet, patient emphatically requests regular / food brought in - tight stricture at 15 cm from the anal verge    TOBACCO ABUSE -cessation discussed     COPD (chronic obstructive pulmonary disease) (HCC) -no current exacerbation, on room air -duoneb prn -tiotropium prn     Diarrhea with hypokalemia and hypomagnesemia -monitoring her electrolytes daily -currently mildly hypokalemic, 3.2, with oral K ordered.     Hematochezia -Likely secondary to the patient's colitis, reports continued sporadic bright red blood with bowel movements and wiping. -hemoglobin 12.2 10/14     Anxiety -on her home dose of Xanax, 132mtid    Pericardial effusion -01/22/2021 echo EF 60 to 65%, moderate pericardial effusion unchanged from 01/19/2021 -Patient is hemodynamically stable    Seizure disorder -on her home keppra dose    Diabetes mellitus without complication ([place]-no current medications, no diet restrictions,  -would prefer soft diet, patient emphatically requests regular / food brought in        DVT prophylaxis: scd Code Status: full Family Communication: family at bedside Disposition: home after resolution of current inflammation and TB test for biologics Status is: Inpatient  Remains inpatient appropriate because: requiring IV steroids for inflammation and electrolyte monitoring       Consultants:  GI  Procedures:  Flexible sigmoidoscopy  Antimicrobials:  Ceftriaxone 10/5-10/7 Ciprofloxacin 10/4-/10/5 Metronidazole 10/4-10/7     Subjective: Reports improved bowel pain, reports a bloody initial bowel movement this morning.  Objective: Vitals:   02/12/21 0930 02/12/21 1244 02/12/21 2229 02/13/21 0500  BP: 128/63 127/72 109/66 114/64  Pulse: 87 87 75 80  Resp: 16 17  16  Temp: 98.1 F (36.7 C) 98.1 F (36.7 C) 97.9 F (36.6 C) 98.2 F (36.8 C)  TempSrc: Oral Oral Oral   SpO2: 96% 96%  97% 96%  Weight:      Height:        Intake/Output Summary (Last 24 hours) at 02/13/2021 1306 Last data filed at 02/13/2021 0900 Gross per 24 hour  Intake 960 ml  Output --  Net 960 ml   Filed Weights   02/03/21 1140 02/04/21 1546  Weight: 73.3 kg 67.3 kg    Examination:  General exam: Appears calm and comfortable    Respiratory system: Clear to auscultation. Respiratory effort normal.   Cardiovascular system: normal S1 & S2 heard. No JVD, murmurs, rubs, gallops or clicks. No pedal edema.   Gastrointestinal system: Abdomen is nondistended, soft and tender in the RLQ. Bowel sounds present.   Central nervous system: Alert and oriented. No focal neurological deficits.   Extremities: moves all extremities appropriately   Skin: No obvious rashes, lesions or ulcers   Psychiatry: Judgement and insight appear normal. Mood & affect appropriate.   Data Reviewed: I have personally reviewed following labs and imaging studies  CBC: Recent Labs  Lab 02/09/21 0512 02/10/21 0530 02/11/21 0549 02/12/21 0601 02/13/21 0755  WBC 5.2 5.0 5.4 5.5 5.4  HGB 10.1* 10.6* 10.7* 10.3* 12.2  HCT 32.2* 32.4* 33.5* 32.4* 37.5  MCV 94.4 94.7 96.0 94.5 96.9  PLT 310 387 250 388 998    Basic Metabolic Panel: Recent Labs  Lab 02/09/21 1545 02/10/21 0530 02/11/21 0549 02/12/21 0601 02/13/21 0755  NA  --  140 138 138 138  K  --  3.2* 3.0* 3.5 3.2*  CL  --  105 104 103 101  CO2  --  27 26 27 28   GLUCOSE  --  87 94 86 88  BUN  --  5* 8 10 11   CREATININE  --  0.77 0.66 0.76 0.82  CALCIUM  --  9.1 8.9 8.9 8.9  MG 1.7  --   --  1.9  --   PHOS  --  3.3  --   --   --     GFR: Estimated Creatinine Clearance: 76.5 mL/min (by C-G formula based on SCr of 0.82 mg/dL).  Liver Function Tests: Recent Labs  Lab 02/10/21 0530  AST 28  ALT 22  ALKPHOS 73  BILITOT 0.4  PROT 5.8*  ALBUMIN 3.0*    CBG: Recent Labs  Lab 02/06/21 2121 02/10/21 2156  GLUCAP 105* 116*    Recent  Results (from the past 240 hour(s))  C Difficile Quick Screen w PCR reflex     Status: None   Collection Time: 02/03/21  9:49 PM   Specimen: STOOL  Result Value Ref Range Status   C Diff antigen NEGATIVE NEGATIVE Final   C Diff toxin NEGATIVE NEGATIVE Final   C Diff interpretation No C. difficile detected.  Final    Comment: Performed at [facility], [address] [place], [place] [address]  Gastrointestinal Panel by PCR , Stool     Status: None   Collection Time: 02/03/21  9:49 PM   Specimen: STOOL  Result Value Ref Range Status   Campylobacter species NOT DETECTED NOT DETECTED Final   Plesimonas shigelloides NOT DETECTED NOT DETECTED Final   Salmonella species NOT DETECTED NOT DETECTED Final   Yersinia enterocolitica NOT DETECTED NOT DETECTED Final   Vibrio species NOT DETECTED NOT DETECTED Final   Vibrio cholerae NOT DETECTED  NOT DETECTED Final   Enteroaggregative E coli (EAEC) NOT DETECTED NOT DETECTED Final   Enteropathogenic E coli (EPEC) NOT DETECTED NOT DETECTED Final   Enterotoxigenic E coli (ETEC) NOT DETECTED NOT DETECTED Final   Shiga like toxin producing E coli (STEC) NOT DETECTED NOT DETECTED Final   Shigella/Enteroinvasive E coli (EIEC) NOT DETECTED NOT DETECTED Final   Cryptosporidium NOT DETECTED NOT DETECTED Final   Cyclospora cayetanensis NOT DETECTED NOT DETECTED Final   Entamoeba histolytica NOT DETECTED NOT DETECTED Final   Giardia lamblia NOT DETECTED NOT DETECTED Final   Adenovirus F40/41 NOT DETECTED NOT DETECTED Final   Astrovirus NOT DETECTED NOT DETECTED Final   Norovirus GI/GII NOT DETECTED NOT DETECTED Final   Rotavirus A NOT DETECTED NOT DETECTED Final   Sapovirus (I, II, IV, and V) NOT DETECTED NOT DETECTED Final    Comment: Performed at [facility], [address], [place], [place] [address]  Urine Culture     Status: None   Collection Time: 02/04/21  9:56 AM   Specimen: Urine, Clean Catch  Result Value Ref Range Status   Specimen  Description   Final    URINE, CLEAN CATCH Performed at [facility], [address] [place], [place] [address]    Special Requests   Final    NONE Performed at [facility], [address], [place], [place] [address]    Culture   Final    NO GROWTH Performed at [facility], [place] [address], [place], [place] [address]    Report Status 02/05/2021 FINAL  Final  SARS Coronavirus 2 by RT PCR (hospital order, performed in [facility] hospital lab) Nasopharyngeal Nasopharyngeal Swab     Status: None   Collection Time: 02/06/21  6:31 AM   Specimen: Nasopharyngeal Swab  Result Value Ref Range Status   SARS Coronavirus 2 NEGATIVE NEGATIVE Final    Comment: (NOTE) SARS-CoV-2 target nucleic acids are NOT DETECTED.  The SARS-CoV-2 RNA is generally detectable in upper and lower respiratory specimens during the acute phase of infection. The lowest concentration of SARS-CoV-2 viral copies this assay can detect is 250 copies / mL. A negative result does not preclude SARS-CoV-2 infection and should not be used as the sole basis for treatment or other patient management decisions.  A negative result may occur with improper specimen collection / handling, submission of specimen other than nasopharyngeal swab, presence of viral mutation(s) within the areas targeted by this assay, and inadequate number of viral copies (<250 copies / mL). A negative result must be combined with clinical observations, patient history, and epidemiological information.  Fact Sheet for Patients:   StrictlyIdeas.no  Fact Sheet for Healthcare Providers: BankingDealers.co.za  This test is not yet approved or  cleared by the [place] FDA and has been authorized for detection and/or diagnosis of SARS-CoV-2 by FDA under an Emergency Use Authorization (EUA).  This EUA will remain in effect (meaning this test can be used) for the duration of the COVID-19 declaration  under Section 564(b)(1) of the Act, 21 U.S.C. section 360bbb-3(b)(1), unless the authorization is terminated or revoked sooner.  Performed at [facility], [address], [place], [place] [address]      Radiology Studies: No results found.  Scheduled Meds:  ALPRAZolam  1 mg Oral TID   amitriptyline  75 mg Oral QHS   DULoxetine  30 mg Oral Daily   ezetimibe  10 mg Oral Daily   hydrocortisone   Rectal BID   levETIRAcetam  750 mg Oral BID   loratadine  10 mg Oral Daily   methylPREDNISolone (SOLU-MEDROL) injection  60 mg Intravenous Daily   pantoprazole  40 mg Oral Daily   Continuous Infusions:   LOS: 10 days     Prentiss Hammett D Jezel Basto, Student How to contact the [facility] Attending or Consulting provider [place] or covering provider during after hours [place], for this patient?  Check the care team in [facility] and look for a) attending/consulting TRH provider listed and b) the [facility] team listed Log into [email or web address] and use [place]'s universal password to access. If you do not have the password, please contact the hospital operator. Locate the [facility] provider you are looking for under [facility] and page to a number that you can be directly reached. If you still have difficulty reaching the provider, please page the [facility] (Director on Call) for the Hospitalists listed on amion for assistance.  02/13/2021, 1:06 PM

## 2021-02-14 ENCOUNTER — Inpatient Hospital Stay (HOSPITAL_COMMUNITY): Payer: 59

## 2021-02-14 DIAGNOSIS — K56699 Other intestinal obstruction unspecified as to partial versus complete obstruction: Secondary | ICD-10-CM | POA: Diagnosis not present

## 2021-02-14 DIAGNOSIS — E119 Type 2 diabetes mellitus without complications: Secondary | ICD-10-CM | POA: Diagnosis not present

## 2021-02-14 DIAGNOSIS — K529 Noninfective gastroenteritis and colitis, unspecified: Secondary | ICD-10-CM | POA: Diagnosis not present

## 2021-02-14 DIAGNOSIS — K921 Melena: Secondary | ICD-10-CM | POA: Diagnosis not present

## 2021-02-14 LAB — CBC
HCT: 35.1 % — ABNORMAL LOW (ref 36.0–46.0)
Hemoglobin: 11.2 g/dL — ABNORMAL LOW (ref 12.0–15.0)
MCH: 30.5 pg (ref 26.0–34.0)
MCHC: 31.9 g/dL (ref 30.0–36.0)
MCV: 95.6 fL (ref 80.0–100.0)
Platelets: 399 10*3/uL (ref 150–400)
RBC: 3.67 MIL/uL — ABNORMAL LOW (ref 3.87–5.11)
RDW: 14.2 % (ref 11.5–15.5)
WBC: 5.8 10*3/uL (ref 4.0–10.5)
nRBC: 0 % (ref 0.0–0.2)

## 2021-02-14 LAB — BASIC METABOLIC PANEL
Anion gap: 8 (ref 5–15)
BUN: 11 mg/dL (ref 6–20)
CO2: 28 mmol/L (ref 22–32)
Calcium: 8.9 mg/dL (ref 8.9–10.3)
Chloride: 103 mmol/L (ref 98–111)
Creatinine, Ser: 0.84 mg/dL (ref 0.44–1.00)
GFR, Estimated: >60 mL/min (ref >60)
Glucose, Bld: 84 mg/dL (ref 70–99)
Potassium: 3.2 mmol/L — ABNORMAL LOW (ref 3.5–5.1)
Sodium: 139 mmol/L (ref 135–145)

## 2021-02-14 LAB — MAGNESIUM: Magnesium: 1.9 mg/dL (ref 1.7–2.4)

## 2021-02-14 MED ORDER — SODIUM CHLORIDE 0.9 % IV SOLN
5.0000 mg/kg | Freq: Once | INTRAVENOUS | Status: AC
  Administered 2021-02-14: 300 mg via INTRAVENOUS
  Filled 2021-02-14: qty 30

## 2021-02-14 MED ORDER — CHLORHEXIDINE GLUCONATE CLOTH 2 % EX PADS
6.0000 | MEDICATED_PAD | Freq: Every day | CUTANEOUS | Status: DC
  Administered 2021-02-14 – 2021-02-16 (×2): 6 via TOPICAL

## 2021-02-14 MED ORDER — HYDROXYZINE HCL 25 MG PO TABS
25.0000 mg | ORAL_TABLET | Freq: Three times a day (TID) | ORAL | Status: DC | PRN
Start: 2021-02-14 — End: 2021-02-22

## 2021-02-14 NOTE — Progress Notes (Signed)
PROGRESS NOTE   [name]  PQZ:[id number] DOB: 09/19/1970 DOA: 02/03/2021 PCP: Teodoro Kil, PA-C   Chief Complaint  Patient presents with   Rectal Bleeding   Level of care: Stepdown  Brief Admission History:  50 year old female with a history of: anxiety, asthma, chronic low back pain, collagen vascular disease, copd, chrons, gerd, htn, ibs, dm, and a seizure disorder. She presented with hematochezia, nausea, and vomiting. She had been recently admitted 01/16/21-01/24/21 due to sepsis secondary to colitis. She had been discharged on PO antibiotics, did not comply with her antibiotics, and has returned with colitis symptoms. GI has been consulted. She reports that she has had numerous loose bowel movements in the preceding days, reports 10/14 she has improved RLQ pain, but continues with blood with bowel movement and wiping. She denies current nausea and vomiting. Currently continuing IV steroids. GI plan is to complete TB test, start biologic if appropriate, refer to outpatient multidisciplinary IBD clinic.   Assessment & Plan:    Principal Problem:   Colitis -10/7 flex sig--Diffuse severe inflammation characterized by erosions, erythema and shallow ulcerations was found in the rectum and in the sigmoid colon extending up to approx 15 cm from the anal verge -10/7 discussed with GI, Dr. Carver--d/c abx, start IV solumedrol for presumptive IBD flare -10/10--discussed case with [facility]--Dr. Atilano Ina transfer; recommends referral to outpt multidisciplinary IBD clinic, reduce current inflammation prior to any surgical interventions.   -currently on 56m solu-medrol qd -GI has consulted, per GI, she will need biologic. Her Hep B serologies are negative for chronic infection. Her TB gold is negative.  REMICADE INFUSION GIVEN 10/15 FOR SALVAGE THERAPY  I CALLED [facility] TRANSFER LINE 10/15.  I SPOKE WITH COLORECTAL SURGEON.  HE SAID TRANSFER WAS APPROPRIATE BUT NEEDED MEDICAL  ADMISSION WITH SURGICAL CONSULT. I CALLED HOSPITALIST AND WAS TOLD THERE WERE NO BEDS TO ACCEPT PATIENT AT THIS TIME AND THEY HAD A LONG WAIT FOR MEDICAL BEDS.  THEY DID HAVE SURGICAL BEDS BUT SURGEON SAID THAT PT WOULD NEED TO GO TO MEDICAL BED AND THEY WOULD CONSULT.    -KUB shows no evidence of toxic megacolon -Gi wishes for outpatient flex-sig in 3-4 weeks -restarting home flexeril and elavil to augment pain control   Active Problems:    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[place] -would prefer soft diet, patient emphatically requests regular / food brought in - tight stricture at 15 cm from the anal verge    TOBACCO ABUSE -cessation discussed     COPD (chronic obstructive pulmonary disease) (HCC) -no current exacerbation, on room air -duoneb prn -tiotropium prn     Diarrhea with hypokalemia and hypomagnesemia -monitoring her electrolytes daily -currently mildly hypokalemic, 3.2, with oral K ordered.     Hematochezia -Likely secondary to the patient's colitis, reports continued sporadic bright red blood with bowel movements and wiping. -hemoglobin 12.2 10/14     Anxiety -on her home dose of Xanax, 13mtid    Pericardial effusion -01/22/2021 echo EF 60 to 65%, moderate pericardial effusion unchanged from 01/19/2021 -Patient is hemodynamically stable    Seizure disorder -on her home keppra dose    Diabetes mellitus without complication ([place]CLe[place]-no current medications, no diet restrictions,  -would prefer soft diet, patient emphatically requests regular / food brought in   DVT prophylaxis: scd Code Status: full Family Communication: husband at bedside Disposition: attempting transfer to tertiary care Status is: Inpatient  Remains inpatient appropriate because: requiring IV steroids for inflammation and electrolyte monitoring  Consultants:  GI  Procedures:  Flexible sigmoidoscopy  Antimicrobials:  Ceftriaxone 10/5-10/7 Ciprofloxacin 10/4-/10/5 Metronidazole 10/4-10/7      Subjective: Pt continues to have frequent bloody stools, abdominal pain  Objective: Vitals:   02/14/21 1715 02/14/21 1730 02/14/21 1800 02/14/21 1815  BP: 114/66 108/62 106/64 104/65  Pulse: 87 81 83 81  Resp: 15 15 16 16   Temp:      TempSrc:      SpO2: 98% 96% 95% 93%  Weight:      Height:        Intake/Output Summary (Last 24 hours) at 02/14/2021 1846 Last data filed at 02/14/2021 1640 Gross per 24 hour  Intake 723.67 ml  Output --  Net 723.67 ml   Filed Weights   02/03/21 1140 02/04/21 1546  Weight: 73.3 kg 67.3 kg    Examination:  General exam: Appears calm and comfortable    Respiratory system: Clear to auscultation. Respiratory effort normal.   Cardiovascular system: normal S1 & S2 heard. No JVD, murmurs, rubs, gallops or clicks. No pedal edema.   Gastrointestinal system: Abdomen is nondistended, soft and tender in the RLQ. Bowel sounds present.   Central nervous system: Alert and oriented. No focal neurological deficits.   Extremities: moves all extremities appropriately   Skin: No obvious rashes, lesions or ulcers   Psychiatry: Judgement and insight appear normal. Mood & affect appropriate.   Data Reviewed: I have personally reviewed following labs and imaging studies  CBC: Recent Labs  Lab 02/10/21 0530 02/11/21 0549 02/12/21 0601 02/13/21 0755 02/14/21 0453  WBC 5.0 5.4 5.5 5.4 5.8  HGB 10.6* 10.7* 10.3* 12.2 11.2*  HCT 32.4* 33.5* 32.4* 37.5 35.1*  MCV 94.7 96.0 94.5 96.9 95.6  PLT 387 250 388 391 308    Basic Metabolic Panel: Recent Labs  Lab 02/09/21 1545 02/10/21 0530 02/11/21 0549 02/12/21 0601 02/13/21 0755 02/14/21 0453  NA  --  140 138 138 138 139  K  --  3.2* 3.0* 3.5 3.2* 3.2*  CL  --  105 104 103 101 103  CO2  --  27 26 27 28 28   GLUCOSE  --  87 94 86 88 84  BUN  --  5* 8 10 11 11   CREATININE  --  0.77 0.66 0.76 0.82 0.84  CALCIUM  --  9.1 8.9 8.9 8.9 8.9  MG 1.7  --   --  1.9  --  1.9  PHOS  --  3.3  --   --    --   --     GFR: Estimated Creatinine Clearance: 74.7 mL/min (by C-G formula based on SCr of 0.84 mg/dL).  Liver Function Tests: Recent Labs  Lab 02/10/21 0530  AST 28  ALT 22  ALKPHOS 73  BILITOT 0.4  PROT 5.8*  ALBUMIN 3.0*    CBG: Recent Labs  Lab 02/10/21 2156  GLUCAP 116*    Recent Results (from the past 240 hour(s))  SARS Coronavirus 2 by RT PCR (hospital order, performed in [facility] hospital lab) Nasopharyngeal Nasopharyngeal Swab     Status: None   Collection Time: 02/06/21  6:31 AM   Specimen: Nasopharyngeal Swab  Result Value Ref Range Status   SARS Coronavirus 2 NEGATIVE NEGATIVE Final    Comment: (NOTE) SARS-CoV-2 target nucleic acids are NOT DETECTED.  The SARS-CoV-2 RNA is generally detectable in upper and lower respiratory specimens during the acute phase of infection. The lowest concentration of SARS-CoV-2 viral copies this assay can detect is  250 copies / mL. A negative result does not preclude SARS-CoV-2 infection and should not be used as the sole basis for treatment or other patient management decisions.  A negative result may occur with improper specimen collection / handling, submission of specimen other than nasopharyngeal swab, presence of viral mutation(s) within the areas targeted by this assay, and inadequate number of viral copies (<250 copies / mL). A negative result must be combined with clinical observations, patient history, and epidemiological information.  Fact Sheet for Patients:   StrictlyIdeas.no  Fact Sheet for Healthcare Providers: BankingDealers.co.za  This test is not yet approved or  cleared by the [place] FDA and has been authorized for detection and/or diagnosis of SARS-CoV-2 by FDA under an Emergency Use Authorization (EUA).  This EUA will remain in effect (meaning this test can be used) for the duration of the COVID-19 declaration under Section 564(b)(1) of the  Act, 21 U.S.C. section 360bbb-3(b)(1), unless the authorization is terminated or revoked sooner.  Performed at [facility], [address], [place], [place] [address]      Radiology Studies: DG Abd 1 View  Result Date: 02/14/2021 CLINICAL DATA:  Abdominal distension and rectal bleeding. History of Crohn's disease. EXAM: ABDOMEN - 1 VIEW COMPARISON:  CT abdomen dated 02/03/2021. FINDINGS: No dilated large or small bowel loops are identified. No evidence of free intraperitoneal air is seen. No evidence of renal or ureteral calculi. Cholecystectomy clips in the RIGHT upper quadrant. Visualized osseous structures are unremarkable. IMPRESSION: Nonobstructive bowel gas pattern. Electronically Signed   By: Franki Cabot M.D.   On: 02/14/2021 08:03    Scheduled Meds:  ALPRAZolam  1 mg Oral TID   amitriptyline  75 mg Oral QHS   Chlorhexidine Gluconate Cloth  6 each Topical Daily   DULoxetine  30 mg Oral Daily   ezetimibe  10 mg Oral Daily   hydrocortisone   Rectal BID   levETIRAcetam  750 mg Oral BID   loratadine  10 mg Oral Daily   methylPREDNISolone (SOLU-MEDROL) injection  60 mg Intravenous Daily   pantoprazole  40 mg Oral Daily   Continuous Infusions:   LOS: 11 days   Jayelle Page Wynetta Emery, MD How to contact the [facility] Attending or Consulting provider [place] or covering provider during after hours [place], for this patient?  Check the care team in [facility] and look for a) attending/consulting TRH provider listed and b) the [facility] team listed Log into [email or web address] and use [place]'s universal password to access. If you do not have the password, please contact the hospital operator. Locate the [facility] provider you are looking for under [facility] and page to a number that you can be directly reached. If you still have difficulty reaching the provider, please page the [facility] (Director on Call) for the Hospitalists listed on amion for assistance.  02/14/2021, 6:46 PM

## 2021-02-14 NOTE — Progress Notes (Signed)
Start infusion at 10 mls hr. For 15 minutes Remicade.

## 2021-02-14 NOTE — Progress Notes (Signed)
[name], M.D. Gastroenterology & Hepatology   Interval History:  No acute new events overnight.  However, the patient states that she is not feeling well and reports that her abdominal pain is worse compared to yesterday.  She had some blood in her bowel movements and had multiple bowel movements at 3 AM.  Overall feels the same as in the past. QuantiFERON came back negative. KUB did not show changes consistent with toxic megacolon.  Inpatient Medications:  Current Facility-Administered Medications:    acetaminophen (TYLENOL) tablet 650 mg, 650 mg, Oral, Q6H PRN, 650 mg at 02/14/21 0853 **OR** acetaminophen (TYLENOL) suppository 650 mg, 650 mg, Rectal, Q6H PRN, Emokpae, Ejiroghene E, MD   ALPRAZolam (XANAX) tablet 1 mg, 1 mg, Oral, TID, Emokpae, Ejiroghene E, MD, 1 mg at 02/14/21 0854   amitriptyline (ELAVIL) tablet 75 mg, 75 mg, Oral, QHS, Mahala Menghini, PA-C, 75 mg at 02/13/21 2053   cyclobenzaprine (FLEXERIL) tablet 10 mg, 10 mg, Oral, TID PRN, Wynetta Emery, Clanford L, MD, 10 mg at 02/14/21 0853   DULoxetine (CYMBALTA) DR capsule 30 mg, 30 mg, Oral, Daily, Johnson, Clanford L, MD, 30 mg at 02/14/21 0854   ezetimibe (ZETIA) tablet 10 mg, 10 mg, Oral, Daily, Johnson, Clanford L, MD, 10 mg at 02/14/21 0854   gabapentin (NEURONTIN) capsule 300 mg, 300 mg, Oral, TID PRN, Wynetta Emery, Clanford L, MD   hydrocortisone (ANUSOL-HC) 2.5 % rectal cream, , Rectal, BID, Emokpae, Ejiroghene E, MD, Given at 02/13/21 2200   ipratropium-albuterol (DUONEB) 0.5-2.5 (3) MG/3ML nebulizer solution 3 mL, 3 mL, Nebulization, Q4H PRN, Emokpae, Ejiroghene E, MD   levETIRAcetam (KEPPRA) tablet 750 mg, 750 mg, Oral, BID, Tat, David, MD, 750 mg at 02/14/21 0854   loratadine (CLARITIN) tablet 10 mg, 10 mg, Oral, Daily, Johnson, Clanford L, MD, 10 mg at 02/14/21 0854   methylPREDNISolone sodium succinate (SOLU-MEDROL) 125 mg/2 mL injection 60 mg, 60 mg, Intravenous, Daily, Tat, David, MD, 60 mg at 02/14/21 0856    ondansetron (ZOFRAN) injection 4 mg, 4 mg, Intravenous, Q6H PRN, Tat, David, MD, 4 mg at 02/05/21 2215   pantoprazole (PROTONIX) EC tablet 40 mg, 40 mg, Oral, Daily, Emokpae, Ejiroghene E, MD, 40 mg at 02/14/21 0854   I/O    Intake/Output Summary (Last 24 hours) at 02/14/2021 0907 Last data filed at 02/13/2021 2043 Gross per 24 hour  Intake 720 ml  Output --  Net 720 ml     Physical Exam: Temp:  [97.6 F (36.4 C)-98.6 F (37 C)] 98.1 F (36.7 C) (10/15 0515) Pulse Rate:  [80-91] 89 (10/15 0515) Resp:  [17-19] 18 (10/15 0515) BP: (112-115)/(65-75) 112/72 (10/15 0515) SpO2:  [95 %-97 %] 95 % (10/15 0515)  Temp (24hrs), Avg:98.1 F (36.7 C), Min:97.6 F (36.4 C), Max:98.6 F (37 C) GENERAL: The patient is AO x3, in moderate distress. HEENT: Head is normocephalic and atraumatic. EOMI are intact. Mouth is well hydrated and without lesions. NECK: Supple. No masses LUNGS: Clear to auscultation. No presence of rhonchi/wheezing/rales. Adequate chest expansion HEART: RRR, normal s1 and s2. ABDOMEN: Soft, nontender, no guarding, no peritoneal signs, and nondistended. BS +. No masses. EXTREMITIES: Without any cyanosis, clubbing, rash, lesions or edema. NEUROLOGIC: AOx3, no focal motor deficit. SKIN: no jaundice, no rashes  Laboratory Data: CBC:     Component Value Date/Time   WBC 5.8 02/14/2021 0453   RBC 3.67 (L) 02/14/2021 0453   HGB 11.2 (L) 02/14/2021 0453   HCT 35.1 (L) 02/14/2021 0453   PLT 399 02/14/2021  0453   MCV 95.6 02/14/2021 0453   MCH 30.5 02/14/2021 0453   MCHC 31.9 02/14/2021 0453   RDW 14.2 02/14/2021 0453   LYMPHSABS 2.9 01/22/2021 0603   MONOABS 1.5 (H) 01/22/2021 0603   EOSABS 0.2 01/22/2021 0603   BASOSABS 0.0 01/22/2021 0603   COAG:  Lab Results  Component Value Date   INR 1.0 09/11/2020   INR 1.03 09/14/2015    BMP:  BMP Latest Ref Rng & Units 02/14/2021 02/13/2021 02/12/2021  Glucose 70 - 99 mg/dL 84 88 86  BUN 6 - 20 mg/dL 11 11 10    Creatinine 0.44 - 1.00 mg/dL 0.84 0.82 0.76  Sodium 135 - 145 mmol/L 139 138 138  Potassium 3.5 - 5.1 mmol/L 3.2(L) 3.2(L) 3.5  Chloride 98 - 111 mmol/L 103 101 103  CO2 22 - 32 mmol/L 28 28 27   Calcium 8.9 - 10.3 mg/dL 8.9 8.9 8.9    HEPATIC:  Hepatic Function Latest Ref Rng & Units 02/10/2021 02/03/2021 01/23/2021  Total Protein 6.5 - 8.1 g/dL 5.8(L) 7.6 -  Albumin 3.5 - 5.0 g/dL 3.0(L) 3.7 2.3(L)  AST 15 - 41 U/L 28 41 -  ALT 0 - 44 U/L 22 29 -  Alk Phosphatase 38 - 126 U/L 73 121 -  Total Bilirubin 0.3 - 1.2 mg/dL 0.4 0.4 -  Bilirubin, Direct 0.0 - 0.2 mg/dL - - -    CARDIAC:  Lab Results  Component Value Date   CKTOTAL 49 01/21/2021   CKMB 1.5 02/23/2011   TROPONINI <0.03 05/09/2016      Imaging: I personally reviewed and interpreted the available labs, imaging and endoscopic files.   Assessment/Plan: 50 year old female with past medical history of COPD, hypertension, diabetes, GERD, IBS and unclear diagnosis of Crohn's disease in 1998, who was readmitted to the hospital after presenting fresh blood in her stool and abdominal pain.  As part of the evaluation of her symptoms she had stool testing that came back negative for infectious causes.  Due to these she underwent a flexible sigmoidoscopy which showed presence of severe inflammation from the rectum up to the sigmoid, however this could not be traversed given the presence of possible stricturing.  Biopsies showed granulation tissue with acute inflammation but no viable, no mucosa could be identified.  Due to this, patient was put on IV steroids on 02/06/2021.  She initially had some symptom improvement but her symptoms have recurred without any major improvement.  At this point it is unclear if her current presentation is related to severe acute ulcerative colitis versus flare of stricturing Crohn's disease versus less likely ischemic changes.  Due to this, her case has been discussed with general surgery who recommended  evaluation at a tertiary center.  Unfortunately, her case has been discussed with [facility], [facility] and [facility] but none of the tertiary centers have available beds at this point.  We will keep working on getting a bed available to transfer her to a larger center.  As a last resort option, we will give her a dose of Remicade 5 mg/kg as salvage therapy and assess her symptom response for potential second dose in a week.  The patient and the husband understood and agreed with the risk of this treatment, but they also understand her critical situation.  - Remicade 5 mg/kg IV x1 - C/w methylprednisolone 60 mg IV qday - Will keep requesting transfer to tertiary center - Advance diet as tolerated - If worsening distention, obtain KUB  STAT  Christine Peppers, MD Gastroenterology and Hepatology [facility]

## 2021-02-15 DIAGNOSIS — K56699 Other intestinal obstruction unspecified as to partial versus complete obstruction: Secondary | ICD-10-CM | POA: Diagnosis not present

## 2021-02-15 DIAGNOSIS — K529 Noninfective gastroenteritis and colitis, unspecified: Secondary | ICD-10-CM | POA: Diagnosis not present

## 2021-02-15 DIAGNOSIS — R197 Diarrhea, unspecified: Secondary | ICD-10-CM | POA: Diagnosis not present

## 2021-02-15 DIAGNOSIS — E119 Type 2 diabetes mellitus without complications: Secondary | ICD-10-CM | POA: Diagnosis not present

## 2021-02-15 DIAGNOSIS — K921 Melena: Secondary | ICD-10-CM | POA: Diagnosis not present

## 2021-02-15 LAB — MAGNESIUM: Magnesium: 2 mg/dL (ref 1.7–2.4)

## 2021-02-15 LAB — BASIC METABOLIC PANEL
Anion gap: 8 (ref 5–15)
BUN: 10 mg/dL (ref 6–20)
CO2: 27 mmol/L (ref 22–32)
Calcium: 9.2 mg/dL (ref 8.9–10.3)
Chloride: 103 mmol/L (ref 98–111)
Creatinine, Ser: 0.79 mg/dL (ref 0.44–1.00)
GFR, Estimated: >60 mL/min (ref >60)
Glucose, Bld: 89 mg/dL (ref 70–99)
Potassium: 3.3 mmol/L — ABNORMAL LOW (ref 3.5–5.1)
Sodium: 138 mmol/L (ref 135–145)

## 2021-02-15 LAB — CBC
HCT: 35.3 % — ABNORMAL LOW (ref 36.0–46.0)
Hemoglobin: 11.4 g/dL — ABNORMAL LOW (ref 12.0–15.0)
MCH: 30.8 pg (ref 26.0–34.0)
MCHC: 32.3 g/dL (ref 30.0–36.0)
MCV: 95.4 fL (ref 80.0–100.0)
Platelets: 398 10*3/uL (ref 150–400)
RBC: 3.7 MIL/uL — ABNORMAL LOW (ref 3.87–5.11)
RDW: 14.2 % (ref 11.5–15.5)
WBC: 6.5 10*3/uL (ref 4.0–10.5)
nRBC: 0 % (ref 0.0–0.2)

## 2021-02-15 LAB — MRSA NEXT GEN BY PCR, NASAL: MRSA by PCR Next Gen: DETECTED — AB

## 2021-02-15 MED ORDER — POTASSIUM CHLORIDE CRYS ER 20 MEQ PO TBCR
60.0000 meq | EXTENDED_RELEASE_TABLET | Freq: Once | ORAL | Status: AC
  Administered 2021-02-15: 60 meq via ORAL
  Filled 2021-02-15: qty 3

## 2021-02-15 NOTE — Progress Notes (Signed)
Lab called positive nasal MRSA screen.

## 2021-02-15 NOTE — Progress Notes (Addendum)
[name], M.D. Gastroenterology & Hepatology   Interval History:  No acute events overnight. Patient received 1 dose of Remicade 5 mg/kg yesterday evening. The patient reports that her symptoms have improved and her pain is much less severe compared to yesterday.  Had 4 bowel movements since yesterday, with less amount of blood compared to prior.  Inpatient Medications:  Current Facility-Administered Medications:    acetaminophen (TYLENOL) tablet 650 mg, 650 mg, Oral, Q6H PRN, 650 mg at 02/14/21 1530 **OR** acetaminophen (TYLENOL) suppository 650 mg, 650 mg, Rectal, Q6H PRN, Emokpae, Ejiroghene E, MD   ALPRAZolam (XANAX) tablet 1 mg, 1 mg, Oral, TID, Emokpae, Ejiroghene E, MD, 1 mg at 02/14/21 2134   amitriptyline (ELAVIL) tablet 75 mg, 75 mg, Oral, QHS, Mahala Menghini, PA-C, 75 mg at 02/14/21 2134   Chlorhexidine Gluconate Cloth 2 % PADS 6 each, 6 each, Topical, Daily, Wynetta Emery, Clanford L, MD, 6 each at 02/14/21 1620   cyclobenzaprine (FLEXERIL) tablet 10 mg, 10 mg, Oral, TID PRN, Wynetta Emery, Clanford L, MD, 10 mg at 02/14/21 2150   DULoxetine (CYMBALTA) DR capsule 30 mg, 30 mg, Oral, Daily, Johnson, Clanford L, MD, 30 mg at 02/14/21 0854   ezetimibe (ZETIA) tablet 10 mg, 10 mg, Oral, Daily, Johnson, Clanford L, MD, 10 mg at 02/14/21 0854   gabapentin (NEURONTIN) capsule 300 mg, 300 mg, Oral, TID PRN, Wynetta Emery, Clanford L, MD   hydrocortisone (ANUSOL-HC) 2.5 % rectal cream, , Rectal, BID, Emokpae, Ejiroghene E, MD, Given at 02/14/21 2136   hydrOXYzine (ATARAX/VISTARIL) tablet 25 mg, 25 mg, Oral, TID PRN, Johnson, Clanford L, MD   ipratropium-albuterol (DUONEB) 0.5-2.5 (3) MG/3ML nebulizer solution 3 mL, 3 mL, Nebulization, Q4H PRN, Emokpae, Ejiroghene E, MD   levETIRAcetam (KEPPRA) tablet 750 mg, 750 mg, Oral, BID, Tat, David, MD, 750 mg at 02/14/21 2134   loratadine (CLARITIN) tablet 10 mg, 10 mg, Oral, Daily, Johnson, Clanford L, MD, 10 mg at 02/14/21 0854   methylPREDNISolone sodium  succinate (SOLU-MEDROL) 125 mg/2 mL injection 60 mg, 60 mg, Intravenous, Daily, Tat, David, MD, 60 mg at 02/14/21 0856   ondansetron (ZOFRAN) injection 4 mg, 4 mg, Intravenous, Q6H PRN, Tat, David, MD, 4 mg at 02/14/21 1627   pantoprazole (PROTONIX) EC tablet 40 mg, 40 mg, Oral, Daily, Emokpae, Ejiroghene E, MD, 40 mg at 02/14/21 0854   potassium chloride SA (KLOR-CON) CR tablet 60 mEq, 60 mEq, Oral, Once, Wynetta Emery, Clanford L, MD   I/O    Intake/Output Summary (Last 24 hours) at 02/15/2021 0847 Last data filed at 02/15/2021 0500 Gross per 24 hour  Intake 1093.67 ml  Output --  Net 1093.67 ml     Physical Exam: Temp:  [97.7 F (36.5 C)-98.4 F (36.9 C)] 97.7 F (36.5 C) (10/15 2137) Pulse Rate:  [78-87] 80 (10/15 2000) Resp:  [12-16] 16 (10/15 2000) BP: (84-114)/(57-66) 106/62 (10/15 2000) SpO2:  [93 %-98 %] 96 % (10/15 2000)  Temp (24hrs), Avg:98.1 F (36.7 C), Min:97.7 F (36.5 C), Max:98.4 F (36.9 C) GENERAL: The patient is AO x3, in moderate distress. HEENT: Head is normocephalic and atraumatic. EOMI are intact. Mouth is well hydrated and without lesions. NECK: Supple. No masses LUNGS: Clear to auscultation. No presence of rhonchi/wheezing/rales. Adequate chest expansion HEART: RRR, normal s1 and s2. ABDOMEN: tender upon palpation of the abdomen in the lower region, no guarding, no peritoneal signs, and nondistended. BS +. No masses. EXTREMITIES: Without any cyanosis, clubbing, rash, lesions or edema. NEUROLOGIC: AOx3, no focal motor deficit. SKIN: no  jaundice, no rashes  Laboratory Data: CBC:     Component Value Date/Time   WBC 6.5 02/15/2021 0422   RBC 3.70 (L) 02/15/2021 0422   HGB 11.4 (L) 02/15/2021 0422   HCT 35.3 (L) 02/15/2021 0422   PLT 398 02/15/2021 0422   MCV 95.4 02/15/2021 0422   MCH 30.8 02/15/2021 0422   MCHC 32.3 02/15/2021 0422   RDW 14.2 02/15/2021 0422   LYMPHSABS 2.9 01/22/2021 0603   MONOABS 1.5 (H) 01/22/2021 0603   EOSABS 0.2 01/22/2021  0603   BASOSABS 0.0 01/22/2021 0603   COAG:  Lab Results  Component Value Date   INR 1.0 09/11/2020   INR 1.03 09/14/2015    BMP:  BMP Latest Ref Rng & Units 02/15/2021 02/14/2021 02/13/2021  Glucose 70 - 99 mg/dL 89 84 88  BUN 6 - 20 mg/dL 10 11 11   Creatinine 0.44 - 1.00 mg/dL 0.79 0.84 0.82  Sodium 135 - 145 mmol/L 138 139 138  Potassium 3.5 - 5.1 mmol/L 3.3(L) 3.2(L) 3.2(L)  Chloride 98 - 111 mmol/L 103 103 101  CO2 22 - 32 mmol/L 27 28 28   Calcium 8.9 - 10.3 mg/dL 9.2 8.9 8.9    HEPATIC:  Hepatic Function Latest Ref Rng & Units 02/10/2021 02/03/2021 01/23/2021  Total Protein 6.5 - 8.1 g/dL 5.8(L) 7.6 -  Albumin 3.5 - 5.0 g/dL 3.0(L) 3.7 2.3(L)  AST 15 - 41 U/L 28 41 -  ALT 0 - 44 U/L 22 29 -  Alk Phosphatase 38 - 126 U/L 73 121 -  Total Bilirubin 0.3 - 1.2 mg/dL 0.4 0.4 -  Bilirubin, Direct 0.0 - 0.2 mg/dL - - -    CARDIAC:  Lab Results  Component Value Date   CKTOTAL 49 01/21/2021   CKMB 1.5 02/23/2011   TROPONINI <0.03 05/09/2016      Imaging: I personally reviewed and interpreted the available labs, imaging and endoscopic files.   Assessment/Plan: 50 year old female with past medical history of COPD, hypertension, diabetes, GERD, IBS and unclear diagnosis of Crohn's disease in 1998, who was readmitted to the hospital after presenting fresh blood in her stool and abdominal pain.  As part of the evaluation of her symptoms she had stool testing that came back negative for infectious causes.  Due to these she underwent a flexible sigmoidoscopy which showed presence of severe inflammation from the rectum up to the sigmoid, however this could not be traversed given the presence of possible stricturing.  Biopsies showed granulation tissue with acute inflammation but no viable, no mucosa could be identified.  Due to this, patient was put on IV steroids on 02/06/2021.  She initially had some symptom improvement but her symptoms have recurred without any major improvement.  At  this point it is unclear if her current presentation is related to severe acute ulcerative colitis versus flare of stricturing Crohn's disease versus less likely ischemic changes.  Due to this, her case has been discussed with general surgery who recommended evaluation at a tertiary center.  Unfortunately, her case has been discussed with [facility], [place] and [facility] but none of the tertiary centers have available beds at this point.  We will keep working on getting a bed available to transfer her to a larger center.  Fortunately, patient received yesterday 1 dose of Remicade 5 mg/kg as a salvage therapy and has presented some symptom improvement, which will be monitored on a daily basis  - C/w methylprednisolone 60 mg IV qday - Will keep  requesting transfer to tertiary center - Advance diet as tolerated - If worsening distention, obtain KUB STAT  [name], MD Gastroenterology and Hepatology [facility]

## 2021-02-15 NOTE — Progress Notes (Signed)
PROGRESS NOTE   [name]  YWV:[id number] DOB: 05-29-1970 DOA: 02/03/2021 PCP: Teodoro Kil, PA-C   Chief Complaint  Patient presents with   Rectal Bleeding   Level of care: Med-Surg  Brief Admission History:  50 year old female with a history of: anxiety, asthma, chronic low back pain, collagen vascular disease, copd, chrons, gerd, htn, ibs, dm, and a seizure disorder. She presented with hematochezia, nausea, and vomiting. She had been recently admitted 01/16/21-01/24/21 due to sepsis secondary to colitis. She had been discharged on PO antibiotics, did not comply with her antibiotics, and has returned with colitis symptoms. GI has been consulted. She reports that she has had numerous loose bowel movements in the preceding days, reports 10/14 she has improved RLQ pain, but continues with blood with bowel movement and wiping. She denies current nausea and vomiting. Currently continuing IV steroids. GI plan is to complete TB test, start biologic if appropriate, refer to outpatient multidisciplinary IBD clinic.   Assessment & Plan:     Colitis -10/7 flex sig--Diffuse severe inflammation characterized by erosions, erythema and shallow ulcerations was found in the rectum and in the sigmoid colon extending up to approx 15 cm from the anal verge -10/7 discussed with GI, Dr. Carver--d/c abx, start IV solumedrol for presumptive IBD flare -10/10--discussed case with [facility]--Dr. Atilano Ina transfer; recommends referral to outpt multidisciplinary IBD clinic, reduce current inflammation prior to any surgical interventions.   -currently on 75m solu-medrol qd -GI has consulted, per GI, she will need biologic. Her Hep B serologies are negative for chronic infection. Her TB gold is negative.  REMICADE INFUSION GIVEN 10/15 FOR SALVAGE THERAPY -Pt reporting that she is having less pain and less BM frequency since receiving remicade infusion.   I CALLED [facility] TRANSFER LINE 10/15.  I  SPOKE WITH COLORECTAL SURGEON.  HE SAID TRANSFER WAS APPROPRIATE BUT NEEDED MEDICAL ADMISSION WITH SURGICAL CONSULT. I CALLED HOSPITALIST AND WAS TOLD THERE WERE NO BEDS TO ACCEPT PATIENT AT THIS TIME AND THEY HAD A LONG WAIT FOR MEDICAL BEDS.  THEY DID HAVE SURGICAL BEDS BUT SURGEON SAID THAT PT WOULD NEED TO GO TO MEDICAL BED AND THEY WOULD CONSULT.    -KUB shows no evidence of toxic megacolon -Gi wishes for outpatient flex-sig in 3-4 weeks -restarting home flexeril and elavil to augment pain control     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[place] -would prefer soft diet, patient emphatically requests regular / food brought in - tight stricture at 15 cm from the anal verge    TOBACCO ABUSE -cessation discussed     COPD (chronic obstructive pulmonary disease) ([place] -no current exacerbation, on room air -duoneb prn -tiotropium prn     Diarrhea with hypokalemia and hypomagnesemia -monitoring her electrolytes daily -currently mildly hypokalemic, 3.2, with oral K ordered.     Hematochezia -Likely secondary to the patient's colitis, reports continued sporadic bright red blood with bowel movements and wiping. -hemoglobin 12.2 10/14     Anxiety -on her home dose of Xanax, 169mtid    Pericardial effusion -01/22/2021 echo EF 60 to 65%, moderate pericardial effusion unchanged from 01/19/2021 -Patient is hemodynamically stable    Seizure disorder -on her home keppra dose    Diabetes mellitus without complication (HCC) -no current medications, no diet restrictions,  -continue eating soft diet, patient having some regular foods brought in   DVT prophylaxis: SCD Code Status: full Family Communication: husband at bedside Disposition: attempting transfer to tertiary care, for now transfer back to med surg  Status is: Inpatient  Remains inpatient appropriate because: requiring IV steroids for inflammation and electrolyte monitoring   Consultants:  GI  Procedures:  Flexible  sigmoidoscopy  Antimicrobials:  Ceftriaxone 10/5-10/7 Ciprofloxacin 10/4-/10/5 Metronidazole 10/4-10/7     Subjective: Pt continues to have frequent bloody stools, abdominal pain  Objective: Vitals:   02/14/21 1900 02/14/21 2000 02/14/21 2137 02/15/21 0900  BP: 101/65 106/62    Pulse: 80 80    Resp: 16 16    Temp:   97.7 F (36.5 C) 98.7 F (37.1 C)  TempSrc:   Oral Oral  SpO2: 93% 96%    Weight:      Height:        Intake/Output Summary (Last 24 hours) at 02/15/2021 1223 Last data filed at 02/15/2021 0900 Gross per 24 hour  Intake 1093.67 ml  Output 400 ml  Net 693.67 ml   Filed Weights   02/03/21 1140 02/04/21 1546  Weight: 73.3 kg 67.3 kg    Examination:  General exam: Appears calm and comfortable    Respiratory system: Clear to auscultation. Respiratory effort normal.   Cardiovascular system: normal S1 & S2 heard. No JVD, murmurs, rubs, gallops or clicks. No pedal edema.   Gastrointestinal system: Abdomen is nondistended, soft and tender in the RLQ. Bowel sounds present.   Central nervous system: Alert and oriented. No focal neurological deficits.   Extremities: moves all extremities appropriately   Skin: No obvious rashes, lesions or ulcers   Psychiatry: Judgement and insight appear normal. Mood & affect appropriate.   Data Reviewed: I have personally reviewed following labs and imaging studies  CBC: Recent Labs  Lab 02/11/21 0549 02/12/21 0601 02/13/21 0755 02/14/21 0453 02/15/21 0422  WBC 5.4 5.5 5.4 5.8 6.5  HGB 10.7* 10.3* 12.2 11.2* 11.4*  HCT 33.5* 32.4* 37.5 35.1* 35.3*  MCV 96.0 94.5 96.9 95.6 95.4  PLT 250 388 391 399 570    Basic Metabolic Panel: Recent Labs  Lab 02/09/21 1545 02/10/21 0530 02/10/21 0530 02/11/21 0549 02/12/21 0601 02/13/21 0755 02/14/21 0453 02/15/21 0422  NA  --  140   < > 138 138 138 139 138  K  --  3.2*   < > 3.0* 3.5 3.2* 3.2* 3.3*  CL  --  105   < > 104 103 101 103 103  CO2  --  27   < > 26 27  28 28 27   GLUCOSE  --  87   < > 94 86 88 84 89  BUN  --  5*   < > 8 10 11 11 10   CREATININE  --  0.77   < > 0.66 0.76 0.82 0.84 0.79  CALCIUM  --  9.1   < > 8.9 8.9 8.9 8.9 9.2  MG 1.7  --   --   --  1.9  --  1.9 2.0  PHOS  --  3.3  --   --   --   --   --   --    < > = values in this interval not displayed.    GFR: Estimated Creatinine Clearance: 78.4 mL/min (by C-G formula based on SCr of 0.79 mg/dL).  Liver Function Tests: Recent Labs  Lab 02/10/21 0530  AST 28  ALT 22  ALKPHOS 73  BILITOT 0.4  PROT 5.8*  ALBUMIN 3.0*    CBG: Recent Labs  Lab 02/10/21 2156  GLUCAP 116*    Recent Results (from the past 240 hour(s))  SARS Coronavirus 2  by RT PCR (hospital order, performed in [facility] hospital lab) Nasopharyngeal Nasopharyngeal Swab     Status: None   Collection Time: 02/06/21  6:31 AM   Specimen: Nasopharyngeal Swab  Result Value Ref Range Status   SARS Coronavirus 2 NEGATIVE NEGATIVE Final    Comment: (NOTE) SARS-CoV-2 target nucleic acids are NOT DETECTED.  The SARS-CoV-2 RNA is generally detectable in upper and lower respiratory specimens during the acute phase of infection. The lowest concentration of SARS-CoV-2 viral copies this assay can detect is 250 copies / mL. A negative result does not preclude SARS-CoV-2 infection and should not be used as the sole basis for treatment or other patient management decisions.  A negative result may occur with improper specimen collection / handling, submission of specimen other than nasopharyngeal swab, presence of viral mutation(s) within the areas targeted by this assay, and inadequate number of viral copies (<250 copies / mL). A negative result must be combined with clinical observations, patient history, and epidemiological information.  Fact Sheet for Patients:   StrictlyIdeas.no  Fact Sheet for Healthcare Providers: BankingDealers.co.za  This test is not yet  approved or  cleared by the [place] FDA and has been authorized for detection and/or diagnosis of SARS-CoV-2 by FDA under an Emergency Use Authorization (EUA).  This EUA will remain in effect (meaning this test can be used) for the duration of the COVID-19 declaration under Section 564(b)(1) of the Act, 21 U.S.C. section 360bbb-3(b)(1), unless the authorization is terminated or revoked sooner.  Performed at [facility], [address] [place], [place] [address]      Radiology Studies: DG Abd 1 View  Result Date: 02/14/2021 CLINICAL DATA:  Abdominal distension and rectal bleeding. History of Crohn's disease. EXAM: ABDOMEN - 1 VIEW COMPARISON:  CT abdomen dated 02/03/2021. FINDINGS: No dilated large or small bowel loops are identified. No evidence of free intraperitoneal air is seen. No evidence of renal or ureteral calculi. Cholecystectomy clips in the RIGHT upper quadrant. Visualized osseous structures are unremarkable. IMPRESSION: Nonobstructive bowel gas pattern. Electronically Signed   By: Franki Cabot M.D.   On: 02/14/2021 08:03    Scheduled Meds:  ALPRAZolam  1 mg Oral TID   amitriptyline  75 mg Oral QHS   Chlorhexidine Gluconate Cloth  6 each Topical Daily   DULoxetine  30 mg Oral Daily   ezetimibe  10 mg Oral Daily   hydrocortisone   Rectal BID   levETIRAcetam  750 mg Oral BID   loratadine  10 mg Oral Daily   methylPREDNISolone (SOLU-MEDROL) injection  60 mg Intravenous Daily   pantoprazole  40 mg Oral Daily   Continuous Infusions:   LOS: 12 days   Javar Eshbach Wynetta Emery, MD How to contact the [facility] Attending or Consulting provider [place] or covering provider during after hours [place], for this patient?  Check the care team in [facility] and look for a) attending/consulting TRH provider listed and b) the [facility] team listed Log into [email or web address] and use [place] universal password to access. If you do not have the password, please contact the hospital operator. Locate the [facility]  provider you are looking for under [facility] and page to a number that you can be directly reached. If you still have difficulty reaching the provider, please page the [facility] (Director on Call) for the Hospitalists listed on amion for assistance.  02/15/2021, 12:23 PM

## 2021-02-16 ENCOUNTER — Inpatient Hospital Stay (HOSPITAL_COMMUNITY): Payer: 59

## 2021-02-16 DIAGNOSIS — E119 Type 2 diabetes mellitus without complications: Secondary | ICD-10-CM | POA: Diagnosis not present

## 2021-02-16 DIAGNOSIS — K921 Melena: Secondary | ICD-10-CM | POA: Diagnosis not present

## 2021-02-16 DIAGNOSIS — R197 Diarrhea, unspecified: Secondary | ICD-10-CM | POA: Diagnosis not present

## 2021-02-16 DIAGNOSIS — K529 Noninfective gastroenteritis and colitis, unspecified: Secondary | ICD-10-CM | POA: Diagnosis not present

## 2021-02-16 LAB — CBC
HCT: 33.9 % — ABNORMAL LOW (ref 36.0–46.0)
Hemoglobin: 11.1 g/dL — ABNORMAL LOW (ref 12.0–15.0)
MCH: 31.3 pg (ref 26.0–34.0)
MCHC: 32.7 g/dL (ref 30.0–36.0)
MCV: 95.5 fL (ref 80.0–100.0)
Platelets: 362 10*3/uL (ref 150–400)
RBC: 3.55 MIL/uL — ABNORMAL LOW (ref 3.87–5.11)
RDW: 14 % (ref 11.5–15.5)
WBC: 6.3 10*3/uL (ref 4.0–10.5)
nRBC: 0 % (ref 0.0–0.2)

## 2021-02-16 LAB — BASIC METABOLIC PANEL
Anion gap: 6 (ref 5–15)
BUN: 12 mg/dL (ref 6–20)
CO2: 27 mmol/L (ref 22–32)
Calcium: 9.2 mg/dL (ref 8.9–10.3)
Chloride: 104 mmol/L (ref 98–111)
Creatinine, Ser: 0.89 mg/dL (ref 0.44–1.00)
GFR, Estimated: >60 mL/min (ref >60)
Glucose, Bld: 92 mg/dL (ref 70–99)
Potassium: 3.7 mmol/L (ref 3.5–5.1)
Sodium: 137 mmol/L (ref 135–145)

## 2021-02-16 NOTE — Progress Notes (Addendum)
Subjective: Feels improved. States she saw blood in stool last night but nursing staff say none was seen. No blood this morning. 2 loose stools this morning since 8 am. Feels frequency of stools has not improved but abdominal pain has improved. Was very hungry yesterday and ate large amount, now regrets it. Poor appetite this morning. Feels a difference since dosing of Remicade. Notes pain in back of head that has been present since last hospitalization. Notes multiple falls prior to hospitalization and when at home had large scab come off of head. 2 wounds occipital area. Largest about a 50 cent piece size and to the right an area slightly smaller than a dime. Well-marcated edges. Appears likely due to prior wound from falling. Areas surrounding wounds without any hair, creating patch of baldness. Some scab still remaining and small amount of sloughing. Tender to touch. Reports right side of head has been numb since prior to hospitalization. Wants further imaging.   Objective: Vital signs in last 24 hours: Temp:  [97.5 F (36.4 C)-97.7 F (36.5 C)] 97.7 F (36.5 C) (10/16 2344) SpO2:  [99 %] 99 % (10/16 2000) Last BM Date: 02/15/21 General:   Alert and oriented, pleasant Head:  occipital region has  2 wounds. Largest about a 50 cent piece size and to the right an area slightly smaller than a dime. Well-marcated edges. Abdomen:  Bowel sounds present, soft, mild TTP RLQ and LLQ, , non-distended. No HSM or hernias noted. No rebound or guarding. No masses appreciated  Msk:  Symmetrical without gross deformities. Normal posture. Pulses:  Normal pulses noted. Extremities:  Without edema. Neurologic:  Alert and  oriented x4 Psych:  Alert and cooperative. Normal mood and affect.  Intake/Output from previous day: 10/16 0701 - 10/17 0700 In: 1360 [P.O.:1360] Out: 400 [Urine:400] Intake/Output this shift: No intake/output data recorded.  Lab Results: Recent Labs    02/14/21 0453  02/15/21 0422 02/16/21 0522  WBC 5.8 6.5 6.3  HGB 11.2* 11.4* 11.1*  HCT 35.1* 35.3* 33.9*  PLT 399 398 362   BMET Recent Labs    02/14/21 0453 02/15/21 0422 02/16/21 0522  NA 139 138 137  K 3.2* 3.3* 3.7  CL 103 103 104  CO2 28 27 27   GLUCOSE 84 89 92  BUN 11 10 12   CREATININE 0.84 0.79 0.89  CALCIUM 8.9 9.2 9.2     Assessment: 50 year old female with reported history of Crohn's disease diagnosed in 1998 at [facility], numerous studies including colonoscopies and imaging since then without evidence of active Crohn's and no treatment, presenting with rectal bleeding and abdominal pain. Flex sig undertaken this admission with  severe inflammation involving the rectum and sigmoid colon secondary to proctosigmoid colitis.  She also had tight stricture at 15 cm from the anal verge.  Unable to pass pediatric colonoscope.  Pathology revealed granulation tissue with marked acute inflammation.  Although endoscopically appearance more consistent with UC, high-grade stricture usually seen with Crohn's disease.   IV steroids started on 02/06/21, with minimal improvement at first but continued to have diarrhea, rectal bleeding, and abdominal pain. Received 1 dose of Remicade 42m/kg on 02/14/21. She notes slight improvement since this time. She notes to me continued frequent loose stools but improved abdominal pain. Rectal bleeding tends to be tapering as well.  Unfortunately, we have been unable to transfer to a tertiary care facility due to bed availability.   If needed, Remicade can be repeated on 10/22 for salvage therapy.  Plan: Continue with IV steroids Remicade can be given again in 10/22 for salvage therapy If improves, pursue outpatient IBD clinic and surgery evaluation at tertiary care If no improvement, continue with hopeful transfer to tertiary care while inpatient if bed becomes available Wound consult for occipital wounds from fall prior to admission  Annitta Needs, PhD,  ANP-BC [facility]    LOS: 13 days    02/16/2021, 9:12 AM

## 2021-02-16 NOTE — Progress Notes (Signed)
Pt to room #204 via WC from ICU. Pt ambulatory from chair to bed, steady gait and tolerated well. Pt concerned that she is constipated, feeling lots of pressure in her rectal area although passing small amount of liquid stool. MD Wynetta Emery notified and to room to evaluate pt. MD updated pt on concerns regarding abd distention and pressure. KUB ordered. MD also updated pt on status on pending transfer to [facility], no bed available. Pt and husband state understanding.

## 2021-02-16 NOTE — Consult Note (Signed)
[facility] Nurse Consult Note: Reason for Consult: 2 wounds s/p fall prior to admission. Located back of head. Appears had scabbed over and now scabs in process of coming off. Possibly some mild sloughing? 50 cent piece size and dime sized. Just want some guidance with care due to location Pressure Injury POA: NA Dressing procedure/placement/frequency: Paint both wounds with betadine and allow to air dry. Difficult to place dressings due to the location.   Monitor the wound area(s) for worsening of condition such as: Signs/symptoms of infection, increase in size, development of or worsening of odor, development of pain, or increased pain at the affected locations.   Notify the medical team if any of these develop.  Thank you for the consult. [place] nurse will not follow at this time.   Please re-consult the [place] team if needed.  Cathlean Marseilles Tamala Julian, MSN, RN, [place], Lysle Pearl, [facility] Wound Treatment Associate Pager [phone]

## 2021-02-16 NOTE — Progress Notes (Signed)
PROGRESS NOTE   [name]  WUJ:[id number] DOB: [birth year] DOA: 02/03/2021 PCP: Teodoro Kil, PA-C   Chief Complaint  Patient presents with   Rectal Bleeding   Level of care: Med-Surg  Brief Admission History:  50 year old female with a history of: anxiety, asthma, chronic low back pain, collagen vascular disease, copd, chrons, gerd, htn, ibs, dm, and a seizure disorder. She presented with hematochezia, nausea, and vomiting. She had been recently admitted 01/16/21-01/24/21 due to sepsis secondary to colitis. She had been discharged on PO antibiotics, did not comply with her antibiotics, and has returned with colitis symptoms. GI has been consulted. She reports that she has had numerous loose bowel movements in the preceding days, reports 10/14 she has improved RLQ pain, but continues with blood with bowel movement and wiping. She denies current nausea and vomiting. Currently continuing IV steroids. GI plan is to complete TB test, start biologic if appropriate, refer to outpatient multidisciplinary IBD clinic.   Assessment & Plan:     Colitis -10/7 flex sig--Diffuse severe inflammation characterized by erosions, erythema and shallow ulcerations was found in the rectum and in the sigmoid colon extending up to approx 15 cm from the anal verge -10/7 discussed with GI, Dr. Carver--d/c abx, started IV solumedrol for presumptive IBD flare -10/10--discussed case with [facility]--Dr. Atilano Ina transfer; recommends referral to outpt multidisciplinary IBD clinic, reduce current inflammation prior to any surgical interventions. 10/15 - remicaide infusion given in hospital    -currently on 32m solu-medrol qd -GI has consulted, per GI, she will need biologic. Her Hep B serologies are negative for chronic infection. Her TB gold is negative.  REMICADE INFUSION GIVEN 10/15 FOR SALVAGE THERAPY -Pt reporting that she is having less pain and less BM frequency since receiving remicade infusion.   I  CALLED [facility] TRANSFER LINE 10/15.  I SPOKE WITH COLORECTAL SURGEON.  HE SAID TRANSFER WAS APPROPRIATE BUT NEEDED MEDICAL ADMISSION WITH SURGICAL CONSULT. I CALLED HOSPITALIST AND WAS TOLD THERE WERE NO BEDS TO ACCEPT PATIENT AT THIS TIME AND THEY HAD A LONG WAIT FOR MEDICAL BEDS.  THEY DID HAVE SURGICAL BEDS BUT SURGEON SAID THAT PT WOULD NEED TO GO TO MEDICAL BED AND THEY WOULD CONSULT.    -KUB shows no evidence of toxic megacolon -Gi wishes for outpatient flex-sig in 3-4 weeks -restarting home flexeril and elavil to augment pain control     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[place] -would prefer soft diet, patient emphatically requests regular / food brought in - tight stricture at 15 cm from the anal verge    TOBACCO ABUSE -cessation discussed     COPD (chronic obstructive pulmonary disease) ([place] -no current exacerbation, on room air -duoneb prn -tiotropium prn     Diarrhea with hypokalemia and hypomagnesemia -monitoring her electrolytes daily -potassium repleted orally, with ongoing diarrhea, we will continue to follow it closely.      Hematochezia -Likely secondary to the patient's colitis, reports continued sporadic bright red blood with bowel movements and wiping which continues to persist even after IV steroids and remicade infusion -hemoglobin 12.2 10/14     Anxiety -on her home dose of Xanax, 137mtid    Pericardial effusion -01/22/2021 echo EF 60 to 65%, moderate pericardial effusion unchanged from 01/19/2021 -Patient is hemodynamically stable    Seizure disorder -on her home keppra dose    Diabetes mellitus without complication ([place]-no current medications, no diet restrictions,  -continue eating soft diet, patient having some regular foods brought in -  has been diet controlled  DVT prophylaxis: SCD Code Status: full Family Communication: husband at bedside Disposition: attempting transfer to tertiary care, for now transfer back to med surg Status is:  Inpatient  Remains inpatient appropriate because: requiring IV steroids for inflammation and electrolyte monitoring  Consultants:  GI  Procedures:  Flexible sigmoidoscopy  Antimicrobials:  Ceftriaxone 10/5-10/7 Ciprofloxacin 10/4-/10/5 Metronidazole 10/4-10/7    Subjective: Pt reports continues to have painful abdomen and diarrhea with tinges of blood.  Overall less severe since receiving the remicade infusion.   Objective: Vitals:   02/15/21 0900 02/15/21 1934 02/15/21 2000 02/15/21 2344  BP:      Pulse:      Resp:      Temp: 98.7 F (37.1 C) (!) 97.5 F (36.4 C)  97.7 F (36.5 C)  TempSrc: Oral Oral  Axillary  SpO2:   99%   Weight:      Height:        Intake/Output Summary (Last 24 hours) at 02/16/2021 1352 Last data filed at 02/16/2021 0000 Gross per 24 hour  Intake 760 ml  Output --  Net 760 ml   Filed Weights   02/03/21 1140 02/04/21 1546  Weight: 73.3 kg 67.3 kg    Examination:  General exam: Appears calm and comfortable    Respiratory system: Clear to auscultation. Respiratory effort normal.   Cardiovascular system: normal S1 & S2 heard. No JVD, murmurs, rubs, gallops or clicks. No pedal edema.   Gastrointestinal system: Abdomen is nondistended, soft and tender in the RLQ. Bowel sounds present.   Central nervous system: Alert and oriented. No focal neurological deficits.   Extremities: moves all extremities appropriately   Skin: No obvious rashes, lesions or ulcers   Psychiatry: Judgement and insight appear normal. Mood & affect appropriate.   Data Reviewed: I have personally reviewed following labs and imaging studies  CBC: Recent Labs  Lab 02/12/21 0601 02/13/21 0755 02/14/21 0453 02/15/21 0422 02/16/21 0522  WBC 5.5 5.4 5.8 6.5 6.3  HGB 10.3* 12.2 11.2* 11.4* 11.1*  HCT 32.4* 37.5 35.1* 35.3* 33.9*  MCV 94.5 96.9 95.6 95.4 95.5  PLT 388 391 399 398 696    Basic Metabolic Panel: Recent Labs  Lab 02/09/21 1545 02/10/21 0530  02/11/21 0549 02/12/21 0601 02/13/21 0755 02/14/21 0453 02/15/21 0422 02/16/21 0522  NA  --  140   < > 138 138 139 138 137  K  --  3.2*   < > 3.5 3.2* 3.2* 3.3* 3.7  CL  --  105   < > 103 101 103 103 104  CO2  --  27   < > 27 28 28 27 27   GLUCOSE  --  87   < > 86 88 84 89 92  BUN  --  5*   < > 10 11 11 10 12   CREATININE  --  0.77   < > 0.76 0.82 0.84 0.79 0.89  CALCIUM  --  9.1   < > 8.9 8.9 8.9 9.2 9.2  MG 1.7  --   --  1.9  --  1.9 2.0  --   PHOS  --  3.3  --   --   --   --   --   --    < > = values in this interval not displayed.    GFR: Estimated Creatinine Clearance: 70.5 mL/min (by C-G formula based on SCr of 0.89 mg/dL).  Liver Function Tests: Recent Labs  Lab 02/10/21 0530  AST 28  ALT 22  ALKPHOS 73  BILITOT 0.4  PROT 5.8*  ALBUMIN 3.0*    CBG: Recent Labs  Lab 02/10/21 2156  GLUCAP 116*    Recent Results (from the past 240 hour(s))  MRSA Next Gen by PCR, Nasal     Status: Abnormal   Collection Time: 02/14/21  2:29 PM   Specimen: Nasal Mucosa; Nasal Swab  Result Value Ref Range Status   MRSA by PCR Next Gen DETECTED (A) NOT DETECTED Final    Comment:  L.HILTON 1234 02/15/21 BY STEPHTR (NOTE) The GeneXpert MRSA Assay (FDA approved for NASAL specimens only), is one component of a comprehensive MRSA colonization surveillance program. It is not intended to diagnose MRSA infection nor to guide or monitor treatment for MRSA infections. Test performance is not FDA approved in patients less than 57 years old. Performed at [facility], [address] [place], [place] [address]      Radiology Studies: CT HEAD WO CONTRAST (5MM)  Result Date: 02/16/2021 CLINICAL DATA:  Head trauma, moderate to severe. Right-sided head numbness over the last 2 days. Fell 1 month ago. EXAM: CT HEAD WITHOUT CONTRAST TECHNIQUE: Contiguous axial images were obtained from the base of the skull through the vertex without intravenous contrast. COMPARISON:  01/16/2021 FINDINGS:  Brain: The brain shows a normal appearance without evidence of malformation, atrophy, old or acute small or large vessel infarction, mass lesion, hemorrhage, hydrocephalus or extra-axial collection. Vascular: No hyperdense vessel. No evidence of atherosclerotic calcification. Skull: Normal.  No traumatic finding.  No focal bone lesion. Sinuses/Orbits: Sinuses are clear. Orbits appear normal. Mastoids are clear. Other: None significant IMPRESSION: Normal head CT. Electronically Signed   By: Nelson Chimes M.D.   On: 02/16/2021 12:50    Scheduled Meds:  ALPRAZolam  1 mg Oral TID   amitriptyline  75 mg Oral QHS   Chlorhexidine Gluconate Cloth  6 each Topical Daily   DULoxetine  30 mg Oral Daily   ezetimibe  10 mg Oral Daily   hydrocortisone   Rectal BID   levETIRAcetam  750 mg Oral BID   loratadine  10 mg Oral Daily   methylPREDNISolone (SOLU-MEDROL) injection  60 mg Intravenous Daily   pantoprazole  40 mg Oral Daily   Continuous Infusions:   LOS: 13 days   Ilena Dieckman Wynetta Emery, MD How to contact the [facility] Attending or Consulting provider [place] or covering provider during after hours [place], for this patient?  Check the care team in [facility] and look for a) attending/consulting TRH provider listed and b) the [facility] team listed Log into [email or web address] and use [place]'s universal password to access. If you do not have the password, please contact the hospital operator. Locate the [facility] provider you are looking for under [facility] and page to a number that you can be directly reached. If you still have difficulty reaching the provider, please page the [facility] (Director on Call) for the Hospitalists listed on amion for assistance.  02/16/2021, 1:52 PM

## 2021-02-16 NOTE — Progress Notes (Signed)
02/16/2021 3:17 PM   [phone] [place] toll-free at [phone].  I reached out to [facility] again today and told they were closed to all medicine transfers at this time.    Gerlene Fee, MD How to contact the [facility] Attending or Consulting provider [place] or covering provider during after hours [place], for this patient?  Check the care team in [facility] and look for a) attending/consulting TRH provider listed and b) the [facility] team listed Log into [email or web address] and use [place]'s universal password to access. If you do not have the password, please contact the hospital operator. Locate the [facility] provider you are looking for under [facility] and page to a number that you can be directly reached. If you still have difficulty reaching the provider, please page the [facility] (Director on Call) for the Hospitalists listed on amion for assistance.

## 2021-02-17 ENCOUNTER — Ambulatory Visit (INDEPENDENT_AMBULATORY_CARE_PROVIDER_SITE_OTHER): Payer: 59 | Admitting: Gastroenterology

## 2021-02-17 DIAGNOSIS — K529 Noninfective gastroenteritis and colitis, unspecified: Secondary | ICD-10-CM | POA: Diagnosis not present

## 2021-02-17 DIAGNOSIS — E119 Type 2 diabetes mellitus without complications: Secondary | ICD-10-CM | POA: Diagnosis not present

## 2021-02-17 DIAGNOSIS — K56699 Other intestinal obstruction unspecified as to partial versus complete obstruction: Secondary | ICD-10-CM | POA: Diagnosis not present

## 2021-02-17 DIAGNOSIS — K921 Melena: Secondary | ICD-10-CM | POA: Diagnosis not present

## 2021-02-17 DIAGNOSIS — R197 Diarrhea, unspecified: Secondary | ICD-10-CM | POA: Diagnosis not present

## 2021-02-17 DIAGNOSIS — K5900 Constipation, unspecified: Secondary | ICD-10-CM | POA: Diagnosis not present

## 2021-02-17 DIAGNOSIS — R140 Abdominal distension (gaseous): Secondary | ICD-10-CM

## 2021-02-17 LAB — CBC
HCT: 34.9 % — ABNORMAL LOW (ref 36.0–46.0)
Hemoglobin: 11.1 g/dL — ABNORMAL LOW (ref 12.0–15.0)
MCH: 30.6 pg (ref 26.0–34.0)
MCHC: 31.8 g/dL (ref 30.0–36.0)
MCV: 96.1 fL (ref 80.0–100.0)
Platelets: 291 10*3/uL (ref 150–400)
RBC: 3.63 MIL/uL — ABNORMAL LOW (ref 3.87–5.11)
RDW: 14.4 % (ref 11.5–15.5)
WBC: 7.5 10*3/uL (ref 4.0–10.5)
nRBC: 0 % (ref 0.0–0.2)

## 2021-02-17 LAB — BASIC METABOLIC PANEL
Anion gap: 8 (ref 5–15)
BUN: 10 mg/dL (ref 6–20)
CO2: 26 mmol/L (ref 22–32)
Calcium: 9 mg/dL (ref 8.9–10.3)
Chloride: 103 mmol/L (ref 98–111)
Creatinine, Ser: 1.01 mg/dL — ABNORMAL HIGH (ref 0.44–1.00)
GFR, Estimated: >60 mL/min (ref >60)
Glucose, Bld: 107 mg/dL — ABNORMAL HIGH (ref 70–99)
Potassium: 3.1 mmol/L — ABNORMAL LOW (ref 3.5–5.1)
Sodium: 137 mmol/L (ref 135–145)

## 2021-02-17 LAB — MAGNESIUM: Magnesium: 2 mg/dL (ref 1.7–2.4)

## 2021-02-17 MED ORDER — POTASSIUM CHLORIDE CRYS ER 20 MEQ PO TBCR
60.0000 meq | EXTENDED_RELEASE_TABLET | Freq: Once | ORAL | Status: AC
  Administered 2021-02-17: 60 meq via ORAL
  Filled 2021-02-17: qty 3

## 2021-02-17 MED ORDER — POLYVINYL ALCOHOL 1.4 % OP SOLN
1.0000 [drp] | OPHTHALMIC | Status: DC | PRN
  Administered 2021-02-17: 1 [drp] via OPHTHALMIC
  Filled 2021-02-17: qty 15

## 2021-02-17 MED ORDER — FENTANYL CITRATE PF 50 MCG/ML IJ SOSY
12.5000 ug | PREFILLED_SYRINGE | INTRAMUSCULAR | Status: DC | PRN
  Administered 2021-02-17: 25 ug via INTRAVENOUS
  Administered 2021-02-17: 12.5 ug via INTRAVENOUS
  Administered 2021-02-17 – 2021-02-18 (×4): 25 ug via INTRAVENOUS
  Administered 2021-02-18: 12.5 ug via INTRAVENOUS
  Administered 2021-02-18: 25 ug via INTRAVENOUS
  Administered 2021-02-18 – 2021-02-19 (×3): 12.5 ug via INTRAVENOUS
  Administered 2021-02-19 – 2021-02-21 (×14): 25 ug via INTRAVENOUS
  Administered 2021-02-21 (×2): 12.5 ug via INTRAVENOUS
  Administered 2021-02-21 – 2021-02-22 (×2): 25 ug via INTRAVENOUS
  Filled 2021-02-17 (×30): qty 1

## 2021-02-17 MED ORDER — CHLORHEXIDINE GLUCONATE CLOTH 2 % EX PADS
6.0000 | MEDICATED_PAD | Freq: Every day | CUTANEOUS | Status: DC
  Administered 2021-02-18 – 2021-02-19 (×2): 6 via TOPICAL

## 2021-02-17 MED ORDER — POLYETHYLENE GLYCOL 3350 17 G PO PACK
17.0000 g | PACK | Freq: Every day | ORAL | Status: DC
  Administered 2021-02-17 – 2021-02-20 (×4): 17 g via ORAL
  Filled 2021-02-17 (×5): qty 1

## 2021-02-17 MED ORDER — MUPIROCIN 2 % EX OINT
1.0000 | TOPICAL_OINTMENT | Freq: Two times a day (BID) | CUTANEOUS | Status: AC
Start: 2021-02-17 — End: 2021-02-22
  Administered 2021-02-17 – 2021-02-21 (×8): 1 via NASAL
  Filled 2021-02-17 (×2): qty 22

## 2021-02-17 NOTE — Progress Notes (Signed)
PROGRESS NOTE   [name]  XLK:[id number] DOB: [birth year] DOA: 02/03/2021 PCP: Teodoro Kil, PA-C   Chief Complaint  Patient presents with   Rectal Bleeding   Level of care: Med-Surg  Brief Admission History:  50 year old female with a history of: anxiety, asthma, chronic low back pain, collagen vascular disease, copd, chrons, gerd, htn, ibs, dm, and a seizure disorder. She presented with hematochezia, nausea, and vomiting. She had been recently admitted 01/16/21-01/24/21 due to sepsis secondary to colitis. She had been discharged on PO antibiotics, did not comply with her antibiotics, and has returned with colitis symptoms. GI has been consulted. She reports that she has had numerous loose bowel movements in the preceding days, reports 10/14 she has improved RLQ pain, but continues with blood with bowel movement and wiping. She denies current nausea and vomiting. Currently continuing IV steroids. GI plan is to complete TB test, start biologic if appropriate, refer to outpatient multidisciplinary IBD clinic.   Assessment & Plan:     Colitis -10/7 flex sig--Diffuse severe inflammation characterized by erosions, erythema and shallow ulcerations was found in the rectum and in the sigmoid colon extending up to approx 15 cm from the anal verge -10/7 discussed with GI, Dr. Carver--d/c abx, started IV solumedrol for presumptive IBD flare -10/10--discussed case with [facility]--Dr. Atilano Ina transfer; recommends referral to outpt multidisciplinary IBD clinic, reduce current inflammation prior to any surgical interventions. 10/15 - remicaide infusion given in hospital, GI said could have repeat remicaide infusion 10/22.    -currently on 10m solu-medrol IV daily  -GI has consulted, per GI, she will need biologic. Her Hep B serologies are negative for chronic infection. Her TB gold is negative.  REMICADE INFUSION GIVEN 10/15 FOR SALVAGE THERAPY, repeat in 1 week - Pt continues to have blood  tinged stool and diarrhea, abdominal pain.    I CALLED [facility] TRANSFER LINE 10/15, 10/17, 10/18.  I SPOKE WITH COLORECTAL SURGEON.  HE SAID TRANSFER WAS APPROPRIATE BUT NEEDED MEDICAL ADMISSION WITH SURGICAL CONSULT. I CALLED HOSPITALIST AND WAS TOLD THERE WERE NO BEDS TO ACCEPT PATIENT AT THIS TIME AND THEY HAD A LONG WAIT FOR MEDICAL BEDS.  THEY DID HAVE SURGICAL BEDS BUT SURGEON SAID THAT PT WOULD NEED TO GO TO MEDICAL BED AND THEY WOULD CONSULT.    -KUB x 2 shows no evidence of toxic megacolon, there is a lot of gas and stool present -GI wishes for outpatient flex-sig in 3-4 weeks -restarting home flexeril and elavil to augment pain control     Stricture of sigmoid colon -would prefer soft diet, patient continues to have regular food brought in - tight stricture at 15 cm from the anal verge - definitive management will be colorectal surgery.   - [facility] feel [facility]     TOBACCO ABUSE -cessation discussed     COPD (chronic [place] disease[place]no current exacerbation, on room air -duoneb prn -tiotropium prn     Diarrhea with hypokalemia and hypomagnesemia -monitoring her electrolytes daily -potassium repleted orally, with ongoing diarrhea, we will continue to follow it closely.      Hematochezia -Likely secondary to the patient's colitis, reports continued sporadic bright red blood with bowel movements and wiping which continues to persist even after IV steroids and remicade infusion -hemoglobin is being monitored.  It has been stable last couple of days.       Anxiety -on her  home dose of Xanax, 52m tid  Chronic abdominal pain - Pt continues to complain of daily pain - Pt has multiple drug allergies and intolerant of pain medications - pharmacy consulted for assistance.  Pt says she took a med at this facilty in 2018 that she was able to tolerate for pain.  I asked them  to research what she might have been given, patient does not remember name.   - She likely is having pain and bloating and constipation from her tight rectal stricture.  We are encouraging soft foods/liquid foods only as she is having difficulty passing solid bowel movements.     Pericardial effusion -01/22/2021 echo EF 60 to 65%, moderate pericardial effusion unchanged from 01/19/2021 -Patient is hemodynamically stable    Seizure disorder -on her home keppra dose and no seizure activity     Diabetes mellitus without complication  -no current medications, no diet restrictions,  -continue eating soft diet, patient having some regular foods brought in -  has been diet controlled  DVT prophylaxis: SCD Code Status: full Family Communication: husband at bedside updated daily  Disposition: attempting transfer to tertiary care, not accepting medical transfers at this time Status is: Inpatient  Remains inpatient appropriate because: requiring IV steroids for inflammation and electrolyte monitoring  Consultants:  GI  Procedures:  Flexible sigmoidoscopy  Antimicrobials:  Ceftriaxone 10/5-10/7 Ciprofloxacin 10/4-/10/5 Metronidazole 10/4-10/7    Subjective: Pt reports continues reporting blood tinged stools and severe abdominal pain and bloating.    Objective: Vitals:   02/16/21 1735 02/16/21 2058 02/16/21 2100 02/17/21 0439  BP: 123/65  132/84 116/70  Pulse: (!) 101  92 98  Resp: 17  16   Temp: (!) 97.5 F (36.4 C)  (!) 97.3 F (36.3 C) 98 F (36.7 C)  TempSrc: Oral  Oral Oral  SpO2: 98%  99% 94%  Weight:      Height:  5' 5"  (1.651 m)      Intake/Output Summary (Last 24 hours) at 02/17/2021 1029 Last data filed at 02/17/2021 0434 Gross per 24 hour  Intake 1680 ml  Output --  Net 1680 ml   Filed Weights   02/03/21 1140 02/04/21 1546  Weight: 73.3 kg 67.3 kg    Examination:  General exam: Appears calm and comfortable    Respiratory system: Clear to auscultation.  Respiratory effort normal.   Cardiovascular system: normal S1 & S2 heard. No JVD, murmurs, rubs, gallops or clicks. No pedal edema.   Gastrointestinal system: Abdomen is mildly distended, soft and tender in the RLQ and periumbilical areas. No guarding or rebound tenderness. Bowel sounds present.   Central nervous system: Alert and oriented. No focal neurological deficits.   Extremities: moves all extremities appropriately   Skin: No obvious rashes, lesions or ulcers   Psychiatry: Judgement and insight appear normal. Mood & affect appropriate.   Data Reviewed: I have personally reviewed following labs and imaging studies  CBC: Recent Labs  Lab 02/13/21 0755 02/14/21 0453 02/15/21 0422 02/16/21 0522 02/17/21 0710  WBC 5.4 5.8 6.5 6.3 7.5  HGB 12.2 11.2* 11.4* 11.1* 11.1*  HCT 37.5 35.1* 35.3* 33.9* 34.9*  MCV 96.9 95.6 95.4 95.5 96.1  PLT 391 399 398 362 2902   Basic Metabolic Panel: Recent Labs  Lab 02/12/21 0601 02/13/21 0755 02/14/21 0453 02/15/21 0422 02/16/21 0522 02/17/21 0710  NA 138 138 139 138 137 137  K 3.5 3.2* 3.2* 3.3* 3.7 3.1*  CL 103 101 103 103 104 103  CO2 27  28 28 27 27 26   GLUCOSE 86 88 84 89 92 107*  BUN 10 11 11 10 12 10   CREATININE 0.76 0.82 0.84 0.79 0.89 1.01*  CALCIUM 8.9 8.9 8.9 9.2 9.2 9.0  MG 1.9  --  1.9 2.0  --  2.0    GFR: Estimated Creatinine Clearance: 59.3 mL/min (A) (by C-G formula based on SCr of 1.01 mg/dL (H)).  Liver Function Tests: No results for input(s): AST, ALT, ALKPHOS, BILITOT, PROT, ALBUMIN in the last 168 hours.   CBG: Recent Labs  Lab 02/10/21 2156  GLUCAP 116*    Recent Results (from the past 240 hour(s))  MRSA Next Gen by PCR, Nasal     Status: Abnormal   Collection Time: 02/14/21  2:29 PM   Specimen: Nasal Mucosa; Nasal Swab  Result Value Ref Range Status   MRSA by PCR Next Gen DETECTED (A) NOT DETECTED Final    Comment:  L.HILTON 1234 02/15/21 BY STEPHTR (NOTE) The GeneXpert MRSA Assay (FDA  approved for NASAL specimens only), is one component of a comprehensive MRSA colonization surveillance program. It is not intended to diagnose MRSA infection nor to guide or monitor treatment for MRSA infections. Test performance is not FDA approved in patients less than 30 years old. Performed at [facility], [address] [place], [place] [address]      Radiology Studies: DG Abd 1 View  Result Date: 02/16/2021 CLINICAL DATA:  Abdominal pain, history of Crohn's EXAM: ABDOMEN - 1 VIEW COMPARISON:  February 14, 2021, February 03, 2021 FINDINGS: Air and stool filled nondilated loops of bowel. Status post cholecystectomy. Visualized lung bases are unremarkable. IMPRESSION: Nonobstructive bowel gas pattern. Electronically Signed   By: Valentino Saxon M.D.   On: 02/16/2021 15:24   CT HEAD WO CONTRAST (5MM)  Result Date: 02/16/2021 CLINICAL DATA:  Head trauma, moderate to severe. Right-sided head numbness over the last 2 days. Fell 1 month ago. EXAM: CT HEAD WITHOUT CONTRAST TECHNIQUE: Contiguous axial images were obtained from the base of the skull through the vertex without intravenous contrast. COMPARISON:  01/16/2021 FINDINGS: Brain: The brain shows a normal appearance without evidence of malformation, atrophy, old or acute small or large vessel infarction, mass lesion, hemorrhage, hydrocephalus or extra-axial collection. Vascular: No hyperdense vessel. No evidence of atherosclerotic calcification. Skull: Normal.  No traumatic finding.  No focal bone lesion. Sinuses/Orbits: Sinuses are clear. Orbits appear normal. Mastoids are clear. Other: None significant IMPRESSION: Normal head CT. Electronically Signed   By: Nelson Chimes M.D.   On: 02/16/2021 12:50    Scheduled Meds:  ALPRAZolam  1 mg Oral TID   amitriptyline  75 mg Oral QHS   DULoxetine  30 mg Oral Daily   ezetimibe  10 mg Oral Daily   hydrocortisone   Rectal BID   levETIRAcetam  750 mg Oral BID   loratadine  10 mg Oral Daily    methylPREDNISolone (SOLU-MEDROL) injection  60 mg Intravenous Daily   pantoprazole  40 mg Oral Daily   Continuous Infusions:   LOS: 14 days   Jamelle Goldston Wynetta Emery, MD How to contact the [facility] Attending or Consulting provider [place] or covering provider during after hours [place], for this patient?  Check the care team in [facility] and look for a) attending/consulting TRH provider listed and b) the [facility] team listed Log into [email or web address] and use [place]'s universal password to access. If you do not have the password, please contact the hospital operator. Locate the [facility] provider  you are looking for under [facility] and page to a number that you can be directly reached. If you still have difficulty reaching the provider, please page the [facility] (Director on Call) for the Hospitalists listed on amion for assistance.  02/17/2021, 10:29 AM

## 2021-02-17 NOTE — Progress Notes (Signed)
02/17/2021 10:28 AM    [phone] [facility] Transfer [facility] I have Contact the Transfer Center toll-free at [phone].   I reached out to [facility] again and told they were closed to all medicine transfers at this time.     Gerlene Fee, MD How to contact the [facility] Attending or Consulting provider [place] or covering provider during after hours [place], for this patient?  Check the care team in [facility] and look for a) attending/consulting TRH provider listed and b) the [facility] team listed Log into [email or web address] and use [place]'s universal password to access. If you do not have the password, please contact the hospital operator. Locate the [facility] provider you are looking for under [facility] and page to a number that you can be directly reached. If you still have difficulty reaching the provider, please page the [facility] (Director on Call) for the Hospitalists listed on amion for assistance.

## 2021-02-17 NOTE — Progress Notes (Signed)
Subjective: Feeling a little better, but continues with intermittent abdominal pain, can be pretty intense at times. Currently feeling bloated and constipated. She is passing multiple very small volume liquid stools per rectum, but frequency decreased since admission. Unable to quantify number of Bms, but states probably more than 6 per day. Occurs every time she urinates. Continues with intermittent rectal bleeding, but frequency also decreased.    Objective: Vital signs in last 24 hours: Temp:  [97.3 F (36.3 C)-98 F (36.7 C)] 98 F (36.7 C) (10/18 0439) Pulse Rate:  [92-111] 98 (10/18 0439) Resp:  [16-17] 16 (10/17 2100) BP: (114-132)/(64-84) 116/70 (10/18 0439) SpO2:  [94 %-99 %] 94 % (10/18 0439) Last BM Date: 02/16/21 General:   Alert and oriented, pleasant, resting comfortably in bed, NAD Head:  Normocephalic and atraumatic. Eyes:  No icterus, sclera clear. Conjuctiva pink.  Abdomen:  Bowel sounds present, soft, non-distended. Mild TTP in RLQ, minimal TTP across upper abdomen, primarily epigastric area which patient reports is more of a bloated sensation. No rebound or guarding. No masses appreciated  Msk:  Symmetrical without gross deformities. Normal posture. Extremities:  Without edema. Neurologic:  Alert and  oriented x4;  grossly normal neurologically. Skin:  Warm and dry, intact without significant lesions.  Psych:  Normal mood and affect.  Intake/Output from previous day: 10/17 0701 - 10/18 0700 In: 1680 [P.O.:1680] Out: -  Intake/Output this shift: No intake/output data recorded.  Lab Results: Recent Labs    02/15/21 0422 02/16/21 0522 02/17/21 0710  WBC 6.5 6.3 7.5  HGB 11.4* 11.1* 11.1*  HCT 35.3* 33.9* 34.9*  PLT 398 362 291   BMET Recent Labs    02/15/21 0422 02/16/21 0522 02/17/21 0710  NA 138 137 137  K 3.3* 3.7 3.1*  CL 103 104 103  CO2 27 27 26   GLUCOSE 89 92 107*  BUN 10 12 10   CREATININE 0.79 0.89 1.01*  CALCIUM 9.2 9.2 9.0     Studies/Results: DG Abd 1 View  Result Date: 02/16/2021 CLINICAL DATA:  Abdominal pain, history of Crohn's EXAM: ABDOMEN - 1 VIEW COMPARISON:  February 14, 2021, February 03, 2021 FINDINGS: Air and stool filled nondilated loops of bowel. Status post cholecystectomy. Visualized lung bases are unremarkable. IMPRESSION: Nonobstructive bowel gas pattern. Electronically Signed   By: Valentino Saxon M.D.   On: 02/16/2021 15:24   CT HEAD WO CONTRAST (5MM)  Result Date: 02/16/2021 CLINICAL DATA:  Head trauma, moderate to severe. Right-sided head numbness over the last 2 days. Fell 1 month ago. EXAM: CT HEAD WITHOUT CONTRAST TECHNIQUE: Contiguous axial images were obtained from the base of the skull through the vertex without intravenous contrast. COMPARISON:  01/16/2021 FINDINGS: Brain: The brain shows a normal appearance without evidence of malformation, atrophy, old or acute small or large vessel infarction, mass lesion, hemorrhage, hydrocephalus or extra-axial collection. Vascular: No hyperdense vessel. No evidence of atherosclerotic calcification. Skull: Normal.  No traumatic finding.  No focal bone lesion. Sinuses/Orbits: Sinuses are clear. Orbits appear normal. Mastoids are clear. Other: None significant IMPRESSION: Normal head CT. Electronically Signed   By: Nelson Chimes M.D.   On: 02/16/2021 12:50    Assessment: 50 year old female with reported history of Crohn's disease diagnosed in 1998 at [facility], numerous studies including colonoscopies and imaging since then without evidence of active Crohn's and no treatment, presenting with rectal bleeding and abdominal pain. Flex sig undertaken this admission with  severe inflammation involving the rectum and sigmoid colon secondary to  proctosigmoid colitis.  She also had tight stricture at 15 cm from the anal verge.  Unable to pass pediatric colonoscope.  Pathology revealed granulation tissue with marked acute inflammation.  Although endoscopically  appearance more consistent with UC, high-grade stricture usually seen with Crohn's disease.   IV steroids started on 02/06/21, with minimal improvement at first but continued to have diarrhea, rectal bleeding, and abdominal pain. Received 1 dose of Remicade 58m/kg on 02/14/21. She notes slight improvement since this time, but continues with multiple liquid stools though frequency decreased and now feeling bloated/constipated. Query whether patient is experiencing overflow diarrhea secondary to known stricture. Abdominal x-ray yesterday with air and stool filled nondilated loops of bowel. Her abdominal pain continues but has improved somewhat. Rectal bleeding is slowing down, but continues daily.  Hg stable at 11.1 today.   Patient needs transfer to tertiary care facility; however, we have been unable to transfer due to bed availability. Will continue to wait on this.   If needed, Remicade can be repeated on 10/22 for salvage therapy.    Plan: Continue with IV steroids Start MiraLAX 17 g daily. Remicade can be given again in 10/22 for salvage therapy. If improves, pursue outpatient IBD clinic and surgery evaluation at tertiary care. If no improvement, continue with hopeful transfer to tertiary care while inpatient if bed becomes available. We will continue to follow with you.    LOS: 14 days    02/17/2021, 11:48 AM   [facility] Altes [facility]

## 2021-02-18 DIAGNOSIS — R109 Unspecified abdominal pain: Secondary | ICD-10-CM

## 2021-02-18 DIAGNOSIS — K529 Noninfective gastroenteritis and colitis, unspecified: Secondary | ICD-10-CM | POA: Diagnosis not present

## 2021-02-18 DIAGNOSIS — R1084 Generalized abdominal pain: Secondary | ICD-10-CM

## 2021-02-18 DIAGNOSIS — K56699 Other intestinal obstruction unspecified as to partial versus complete obstruction: Secondary | ICD-10-CM | POA: Diagnosis not present

## 2021-02-18 LAB — CBC
HCT: 36.1 % (ref 36.0–46.0)
Hemoglobin: 11.2 g/dL — ABNORMAL LOW (ref 12.0–15.0)
MCH: 30.4 pg (ref 26.0–34.0)
MCHC: 31 g/dL (ref 30.0–36.0)
MCV: 97.8 fL (ref 80.0–100.0)
Platelets: 352 10*3/uL (ref 150–400)
RBC: 3.69 MIL/uL — ABNORMAL LOW (ref 3.87–5.11)
RDW: 14.2 % (ref 11.5–15.5)
WBC: 8.2 10*3/uL (ref 4.0–10.5)
nRBC: 0 % (ref 0.0–0.2)

## 2021-02-18 LAB — BASIC METABOLIC PANEL
Anion gap: 9 (ref 5–15)
BUN: 11 mg/dL (ref 6–20)
CO2: 25 mmol/L (ref 22–32)
Calcium: 9.6 mg/dL (ref 8.9–10.3)
Chloride: 103 mmol/L (ref 98–111)
Creatinine, Ser: 0.87 mg/dL (ref 0.44–1.00)
GFR, Estimated: >60 mL/min (ref >60)
Glucose, Bld: 84 mg/dL (ref 70–99)
Potassium: 3.7 mmol/L (ref 3.5–5.1)
Sodium: 137 mmol/L (ref 135–145)

## 2021-02-18 LAB — MAGNESIUM: Magnesium: 2.1 mg/dL (ref 1.7–2.4)

## 2021-02-18 MED ORDER — POLYETHYLENE GLYCOL 3350 17 G PO PACK
17.0000 g | PACK | Freq: Once | ORAL | Status: AC
  Administered 2021-02-18: 17 g via ORAL
  Filled 2021-02-18: qty 1

## 2021-02-18 NOTE — Progress Notes (Signed)
PROGRESS NOTE    [name]  TIR:[id number] DOB: 1970-12-25 DOA: 02/03/2021 PCP: Teodoro Kil, PA-C   Brief Narrative:   50 year old female with a history of: anxiety, asthma, chronic low back pain, collagen vascular disease, copd, chrons, gerd, htn, ibs, dm, and a seizure disorder. She presented with hematochezia, nausea, and vomiting. She had been recently admitted 01/16/21-01/24/21 due to sepsis secondary to colitis. She had been discharged on PO antibiotics, did not comply with her antibiotics, and has returned with colitis symptoms. GI has been consulted. She reports that she has had numerous loose bowel movements in the preceding days, reports 10/14 she has improved RLQ pain, but continues with blood with bowel movement and wiping. She denies current nausea and vomiting. Currently continuing IV steroids.  Further plans per GI pending with potential need for transfer, but unfortunately beds are not available.  Consultation with general surgery pending.  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 Carver--d/c abx, started IV solumedrol for presumptive IBD flare -10/10--discussed case with [facility]--Dr. Atilano Ina transfer; recommends referral to outpt multidisciplinary IBD clinic, reduce current inflammation prior to any surgical interventions. 10/15 - remicaide infusion given in hospital, GI said could have repeat remicaide infusion 10/22.  -currently on 63m  solu-medrol IV daily  -GI has consulted, per GI, she will need biologic. Her Hep B serologies are negative for chronic infection. Her TB gold is negative.  REMICADE INFUSION GIVEN 10/15 FOR SALVAGE THERAPY, repeat in 1 week - Pt continues to have blood tinged stool and diarrhea, abdominal pain.    -[facility] with discussions with colorectal surgeon who felt that transfer was appropriate, but needed medical admission with surgical consult.  Unfortunately, there have not been any medical beds available. -KUB x 2 shows no evidence of toxic megacolon, there is a lot of gas and stool present -GI wishes for outpatient flex-sig in 3-4 weeks -restarting home flexeril and elavil to augment pain control     Stricture of sigmoid colon -would prefer soft diet, patient continues to have regular food brought in - tight stricture at 15 cm from the anal verge - definitive management will be colorectal surgery.   - [facility] feel [facility] needs tertiary care      TOBACCO ABUSE -cessation discussed     COPD (chronic obstructive pulmonary disease) ([place] -no current exacerbation, on room air -duoneb prn -tiotropium prn     Diarrhea with hypokalemia and hypomagnesemia -monitoring her electrolytes daily -potassium repleted orally, with ongoing diarrhea, we will continue to follow it closely.      Hematochezia -Likely secondary to the patient's colitis, reports continued sporadic bright red blood with bowel movements and wiping which continues to persist even after IV steroids and remicade infusion -hemoglobin is being monitored.  It has been stable last couple of days.  Anxiety -on her home dose of Xanax, 74m tid   Chronic abdominal pain - Pt continues to complain of daily pain - Pt has multiple drug allergies and intolerant of pain medications - pharmacy consulted for  assistance.  Pt says she took a med at this facilty in 2018 that she was able to tolerate for pain.  I asked them to research what she might have been given, patient does not remember name.   - She likely is having pain and bloating and constipation from her tight rectal stricture.  We are encouraging soft foods/liquid foods only as she is having difficulty passing solid bowel movements.     Pericardial effusion -01/22/2021 echo EF 60 to 65%, moderate pericardial effusion unchanged from 01/19/2021 -Patient is hemodynamically stable    Seizure disorder -on her home keppra dose and no seizure activity      Diabetes mellitus without complication  -no current medications, no diet restrictions,  -continue eating soft diet, patient having some regular foods brought in -  has been diet controlled   DVT prophylaxis: SCDs Code Status: Full Family Communication: Husband at bedside 10/19 Disposition Plan:  Status is: Inpatient  Remains inpatient appropriate because: Ongoing need for IV steroids  Consultants:  GI General surgery  Procedures:  Flex sigmoidoscopy  Antimicrobials:  Ceftriaxone 10/5-10/7 Ciprofloxacin 10/4-/10/5 Metronidazole 10/4-10/7  Subjective: Patient seen and evaluated today with ongoing constipation and blood-tinged stools.  She continues to have bloating as well as abdominal pain.  Objective: Vitals:   02/17/21 0439 02/17/21 1507 02/17/21 2103 02/18/21 0539  BP: 116/70 132/81 122/68 110/62  Pulse: 98 (!) 109 81 89  Resp:  20 17 17   Temp: 98 F (36.7 C) 97.7 F (36.5 C) (!) 97.4 F (36.3 C) (!) 97.5 F (36.4 C)  TempSrc: Oral Oral Oral Oral  SpO2: 94% 98% 96% 98%  Weight:      Height:        Intake/Output Summary (Last 24 hours) at 02/18/2021 1123 Last data filed at 02/18/2021 1000 Gross per 24 hour  Intake 440 ml  Output --  Net 440 ml   Filed Weights   02/03/21 1140 02/04/21 1546  Weight: 73.3 kg 67.3 kg    Examination:  General exam:  Appears calm and comfortable  Respiratory system: Clear to auscultation. Respiratory effort normal. Cardiovascular system: S1 & S2 heard, RRR.  Gastrointestinal system: Abdomen is soft Central nervous system: Alert and awake Extremities: No edema Skin: No significant lesions noted Psychiatry: Flat affect.    Data Reviewed: I have personally reviewed following labs and imaging studies  CBC: Recent Labs  Lab 02/14/21 0453 02/15/21 0422 02/16/21 0522 02/17/21 0710 02/18/21 0745  WBC 5.8 6.5 6.3 7.5 8.2  HGB 11.2* 11.4* 11.1* 11.1* 11.2*  HCT 35.1* 35.3* 33.9* 34.9* 36.1  MCV 95.6 95.4 95.5 96.1 97.8  PLT 399 398 362 291 3169  Basic Metabolic Panel: Recent Labs  Lab 02/12/21 0601 02/13/21 0755 02/14/21 0453 02/15/21 0422 02/16/21 0522 02/17/21 0710 02/18/21 0624  NA 138   < > 139 138 137 137 137  K 3.5   < > 3.2* 3.3* 3.7 3.1* 3.7  CL 103   < > 103 103 104 103 103  CO2 27   < > 28 27 27 26 25   GLUCOSE 86   < > 84 89 92 107* 84  BUN 10   < > 11 10 12 10 11   CREATININE 0.76   < > 0.84 0.79 0.89 1.01*  0.87  CALCIUM 8.9   < > 8.9 9.2 9.2 9.0 9.6  MG 1.9  --  1.9 2.0  --  2.0 2.1   < > = values in this interval not displayed.   GFR: Estimated Creatinine Clearance: 68.8 mL/min (by C-G formula based on SCr of 0.87 mg/dL). Liver Function Tests: No results for input(s): AST, ALT, ALKPHOS, BILITOT, PROT, ALBUMIN in the last 168 hours. No results for input(s): LIPASE, AMYLASE in the last 168 hours. No results for input(s): AMMONIA in the last 168 hours. Coagulation Profile: No results for input(s): INR, PROTIME in the last 168 hours. Cardiac Enzymes: No results for input(s): CKTOTAL, CKMB, CKMBINDEX, TROPONINI in the last 168 hours. BNP (last 3 results) No results for input(s): PROBNP in the last 8760 hours. HbA1C: No results for input(s): HGBA1C in the last 72 hours. CBG: No results for input(s): GLUCAP in the last 168 hours. Lipid Profile: No results for input(s):  CHOL, HDL, LDLCALC, TRIG, CHOLHDL, LDLDIRECT in the last 72 hours. Thyroid Function Tests: No results for input(s): TSH, T4TOTAL, FREET4, T3FREE, THYROIDAB in the last 72 hours. Anemia Panel: No results for input(s): VITAMINB12, FOLATE, FERRITIN, TIBC, IRON, RETICCTPCT in the last 72 hours. Sepsis Labs: No results for input(s): PROCALCITON, LATICACIDVEN in the last 168 hours.  Recent Results (from the past 240 hour(s))  MRSA Next Gen by PCR, Nasal     Status: Abnormal   Collection Time: 02/14/21  2:29 PM   Specimen: Nasal Mucosa; Nasal Swab  Result Value Ref Range Status   MRSA by PCR Next Gen DETECTED (A) NOT DETECTED Final    Comment:  L.HILTON 1234 02/15/21 BY STEPHTR (NOTE) The GeneXpert MRSA Assay (FDA approved for NASAL specimens only), is one component of a comprehensive MRSA colonization surveillance program. It is not intended to diagnose MRSA infection nor to guide or monitor treatment for MRSA infections. Test performance is not FDA approved in patients less than 63 years old. Performed at [facility], [address] [place], [place] [address]          Radiology Studies: DG Abd 1 View  Result Date: 02/16/2021 CLINICAL DATA:  Abdominal pain, history of Crohn's EXAM: ABDOMEN - 1 VIEW COMPARISON:  February 14, 2021, February 03, 2021 FINDINGS: Air and stool filled nondilated loops of bowel. Status post cholecystectomy. Visualized lung bases are unremarkable. IMPRESSION: Nonobstructive bowel gas pattern. Electronically Signed   By: Valentino Saxon M.D.   On: 02/16/2021 15:24        Scheduled Meds:  ALPRAZolam  1 mg Oral TID   amitriptyline  75 mg Oral QHS   Chlorhexidine Gluconate Cloth  6 each Topical Q0600   DULoxetine  30 mg Oral Daily   ezetimibe  10 mg Oral Daily   hydrocortisone   Rectal BID   levETIRAcetam  750 mg Oral BID   loratadine  10 mg Oral Daily   methylPREDNISolone (SOLU-MEDROL) injection  60 mg Intravenous Daily   mupirocin ointment  1  application Nasal BID   pantoprazole  40 mg Oral Daily   polyethylene glycol  17 g Oral Daily     LOS: 15 days    Time spent: 35 minutes    Blimie Vaness Darleen Crocker, DO [facility]  If 7PM-7AM, please contact night-coverage [email or web address] 02/18/2021, 11:23 AM

## 2021-02-18 NOTE — H&P (View-Only) (Signed)
Subjective: Patient states that she was up and down most of the night, maybe 5 times to have a BM but states that she is having minimal stools now, just a few drops of liquid stool, she had a small amount of blood yesterday when wiping, no blood noted in the toilet. She continues to have ongoing lower abdominal pain throughout this admission. She has had two doses of miralax since yesterday afternoon. She continues to endorse some bloating/distention to her abdomen. She thinks her last larger BM was likely on Monday. She is tolerating food okay and denies any episodes of vomiting.  Objective: Vital signs in last 24 hours: Temp:  [97.4 F (36.3 C)-97.7 F (36.5 C)] 97.5 F (36.4 C) (10/19 0539) Pulse Rate:  [81-109] 89 (10/19 0539) Resp:  [17-20] 17 (10/19 0539) BP: (110-132)/(62-81) 110/62 (10/19 0539) SpO2:  [96 %-98 %] 98 % (10/19 0539) Last BM Date: 02/16/21 General:   Alert and oriented, pleasant Head:  Normocephalic and atraumatic. Eyes:  No icterus, sclera clear. Conjuctiva pink.  Mouth:  Without lesions, mucosa pink and moist.   Heart:  S1, S2 present, no murmurs noted.  Lungs: Clear to auscultation bilaterally, without wheezing, rales, or rhonchi.  Abdomen:  Bowel sounds present, soft, minimal distention, not taut No HSM or hernias noted. No rebound or guarding. No masses appreciated. Ongoing TTP of lower abdomen. Msk:  Symmetrical without gross deformities. Normal posture. Pulses:  Normal pulses noted. Extremities:  Without clubbing or edema. Neurologic:  Alert and  oriented x4;  grossly normal neurologically. Skin:  Warm and dry, intact without significant lesions.  Psych:  Alert and cooperative. Normal mood and affect.  Intake/Output from previous day: 10/18 0701 - 10/19 0700 In: 200 [P.O.:200] Out: -  Intake/Output this shift: No intake/output data recorded.  Lab Results: Recent Labs    02/16/21 0522 02/17/21 0710 02/18/21 0745  WBC 6.3 7.5 8.2  HGB 11.1* 11.1*  11.2*  HCT 33.9* 34.9* 36.1  PLT 362 291 352   BMET Recent Labs    02/16/21 0522 02/17/21 0710 02/18/21 0624  NA 137 137 137  K 3.7 3.1* 3.7  CL 104 103 103  CO2 27 26 25   GLUCOSE 92 107* 84  BUN 12 10 11   CREATININE 0.89 1.01* 0.87  CALCIUM 9.2 9.0 9.6    Studies/Results: DG Abd 1 View  Result Date: 02/16/2021 CLINICAL DATA:  Abdominal pain, history of Crohn's EXAM: ABDOMEN - 1 VIEW COMPARISON:  February 14, 2021, February 03, 2021 FINDINGS: Air and stool filled nondilated loops of bowel. Status post cholecystectomy. Visualized lung bases are unremarkable. IMPRESSION: Nonobstructive bowel gas pattern. Electronically Signed   By: Valentino Saxon M.D.   On: 02/16/2021 15:24   CT HEAD WO CONTRAST (5MM)  Result Date: 02/16/2021 CLINICAL DATA:  Head trauma, moderate to severe. Right-sided head numbness over the last 2 days. Fell 1 month ago. EXAM: CT HEAD WITHOUT CONTRAST TECHNIQUE: Contiguous axial images were obtained from the base of the skull through the vertex without intravenous contrast. COMPARISON:  01/16/2021 FINDINGS: Brain: The brain shows a normal appearance without evidence of malformation, atrophy, old or acute small or large vessel infarction, mass lesion, hemorrhage, hydrocephalus or extra-axial collection. Vascular: No hyperdense vessel. No evidence of atherosclerotic calcification. Skull: Normal.  No traumatic finding.  No focal bone lesion. Sinuses/Orbits: Sinuses are clear. Orbits appear normal. Mastoids are clear. Other: None significant IMPRESSION: Normal head CT. Electronically Signed   By: Nelson Chimes M.D.   On: 02/16/2021  12:50    Assessment: 50 year old female with reported hx of Crohn's disease, diagnosed in 12 at [facility], numerous studies including colonoscopies and imagingin since then without evidence of active Crohn's, no previous treatment, presenting to hospital with rectal bleeding, diarrhea and abdominal pain. Flex sig done this admission with  severe inflammation involving rectum and sigmoid colon secondary to proctosimoid colitis. She also had tight stricture at 15cm from anal verge. Unable to pass pediatric colonoscope. Pathology revealed granulation tissue with marked acute inflammation, although endoscopically appearing more consistent with UC, high-grade stricture likely indicates Crohn's disease.   IV steroids started on 02/06/21, with minimal improvement at first but continued to have diarrhea, rectal bleeding and abdominal pain. She received first dose of Remicade 17m/kg on 02/14/21 with noted slight improvement thereafter, however, she continues with multiple liquid stools of minimal volume, though frequency decreased some, she began c/o feeling bloated/constipated yesterday. Abd xray on Monday with air and stool filled nondilated loops of bowel. Rectal bleeding continues to decrease but is still present daily, mostly with wiping now, Hgb remains stable at 11.2, with consistently similar values over the past few days.  It was suspected patient may be experiencing some overflow diarrhea secondary to known stricture, she was started on Miralax 17g daily, yesterday and has had two doses thus far, however, she continues to feel bloated, passing minimal flatus and still very small volume liquid stools. Consider increasing miralax to BID. Bowel sounds present, she denies any episodes of vomiting and continues to tolerate food. May consider repeating abdominal imaging, however, her abdomen is with minimal distention and soft, low suspicion for obstruction at this time.   Ultimately patient needs transfer to tertiary care facility, however, no bed are available so transfer is on hold at this time.  Remicade can be repeated on 10/22 for salvage therapy.  Plan: Continue IV steroids Continue Miralax 17 g daily, may need to increase to BID Remicade dose can be repeated on 10/22 for salvage therapy If improvement, consider outpatient IBD  clinic/surgery eval at tertiary care facility If no improvement, continue with possible transfer to tertiary care facility   LOS: 15 days    02/18/2021, 9:04 AM   Destini Cambre L. CAlver Sorrow MSN, APRN, AGNP-C Adult-Gerontology Nurse Practitioner [facility]

## 2021-02-18 NOTE — Progress Notes (Signed)
Subjective: Patient states that she was up and down most of the night, maybe 5 times to have a BM but states that she is having minimal stools now, just a few drops of liquid stool, she had a small amount of blood yesterday when wiping, no blood noted in the toilet. She continues to have ongoing lower abdominal pain throughout this admission. She has had two doses of miralax since yesterday afternoon. She continues to endorse some bloating/distention to her abdomen. She thinks her last larger BM was likely on Monday. She is tolerating food okay and denies any episodes of vomiting.  Objective: Vital signs in last 24 hours: Temp:  [97.4 F (36.3 C)-97.7 F (36.5 C)] 97.5 F (36.4 C) (10/19 0539) Pulse Rate:  [81-109] 89 (10/19 0539) Resp:  [17-20] 17 (10/19 0539) BP: (110-132)/(62-81) 110/62 (10/19 0539) SpO2:  [96 %-98 %] 98 % (10/19 0539) Last BM Date: 02/16/21 General:   Alert and oriented, pleasant Head:  Normocephalic and atraumatic. Eyes:  No icterus, sclera clear. Conjuctiva pink.  Mouth:  Without lesions, mucosa pink and moist.   Heart:  S1, S2 present, no murmurs noted.  Lungs: Clear to auscultation bilaterally, without wheezing, rales, or rhonchi.  Abdomen:  Bowel sounds present, soft, minimal distention, not taut No HSM or hernias noted. No rebound or guarding. No masses appreciated. Ongoing TTP of lower abdomen. Msk:  Symmetrical without gross deformities. Normal posture. Pulses:  Normal pulses noted. Extremities:  Without clubbing or edema. Neurologic:  Alert and  oriented x4;  grossly normal neurologically. Skin:  Warm and dry, intact without significant lesions.  Psych:  Alert and cooperative. Normal mood and affect.  Intake/Output from previous day: 10/18 0701 - 10/19 0700 In: 200 [P.O.:200] Out: -  Intake/Output this shift: No intake/output data recorded.  Lab Results: Recent Labs    02/16/21 0522 02/17/21 0710 02/18/21 0745  WBC 6.3 7.5 8.2  HGB 11.1* 11.1*  11.2*  HCT 33.9* 34.9* 36.1  PLT 362 291 352   BMET Recent Labs    02/16/21 0522 02/17/21 0710 02/18/21 0624  NA 137 137 137  K 3.7 3.1* 3.7  CL 104 103 103  CO2 27 26 25   GLUCOSE 92 107* 84  BUN 12 10 11   CREATININE 0.89 1.01* 0.87  CALCIUM 9.2 9.0 9.6    Studies/Results: DG Abd 1 View  Result Date: 02/16/2021 CLINICAL DATA:  Abdominal pain, history of Crohn's EXAM: ABDOMEN - 1 VIEW COMPARISON:  February 14, 2021, February 03, 2021 FINDINGS: Air and stool filled nondilated loops of bowel. Status post cholecystectomy. Visualized lung bases are unremarkable. IMPRESSION: Nonobstructive bowel gas pattern. Electronically Signed   By: Valentino Saxon M.D.   On: 02/16/2021 15:24   CT HEAD WO CONTRAST (5MM)  Result Date: 02/16/2021 CLINICAL DATA:  Head trauma, moderate to severe. Right-sided head numbness over the last 2 days. Fell 1 month ago. EXAM: CT HEAD WITHOUT CONTRAST TECHNIQUE: Contiguous axial images were obtained from the base of the skull through the vertex without intravenous contrast. COMPARISON:  01/16/2021 FINDINGS: Brain: The brain shows a normal appearance without evidence of malformation, atrophy, old or acute small or large vessel infarction, mass lesion, hemorrhage, hydrocephalus or extra-axial collection. Vascular: No hyperdense vessel. No evidence of atherosclerotic calcification. Skull: Normal.  No traumatic finding.  No focal bone lesion. Sinuses/Orbits: Sinuses are clear. Orbits appear normal. Mastoids are clear. Other: None significant IMPRESSION: Normal head CT. Electronically Signed   By: Nelson Chimes M.D.   On: 02/16/2021  12:50    Assessment: 50 year old female with reported hx of Crohn's disease, diagnosed in 77 at [facility], numerous studies including colonoscopies and imagingin since then without evidence of active Crohn's, no previous treatment, presenting to hospital with rectal bleeding, diarrhea and abdominal pain. Flex sig done this admission with  severe inflammation involving rectum and sigmoid colon secondary to proctosimoid colitis. She also had tight stricture at 15cm from anal verge. Unable to pass pediatric colonoscope. Pathology revealed granulation tissue with marked acute inflammation, although endoscopically appearing more consistent with UC, high-grade stricture likely indicates Crohn's disease.   IV steroids started on 02/06/21, with minimal improvement at first but continued to have diarrhea, rectal bleeding and abdominal pain. She received first dose of Remicade 81m/kg on 02/14/21 with noted slight improvement thereafter, however, she continues with multiple liquid stools of minimal volume, though frequency decreased some, she began c/o feeling bloated/constipated yesterday. Abd xray on Monday with air and stool filled nondilated loops of bowel. Rectal bleeding continues to decrease but is still present daily, mostly with wiping now, Hgb remains stable at 11.2, with consistently similar values over the past few days.  It was suspected patient may be experiencing some overflow diarrhea secondary to known stricture, she was started on Miralax 17g daily, yesterday and has had two doses thus far, however, she continues to feel bloated, passing minimal flatus and still very small volume liquid stools. Consider increasing miralax to BID. Bowel sounds present, she denies any episodes of vomiting and continues to tolerate food. May consider repeating abdominal imaging, however, her abdomen is with minimal distention and soft, low suspicion for obstruction at this time.   Ultimately patient needs transfer to tertiary care facility, however, no bed are available so transfer is on hold at this time.  Remicade can be repeated on 10/22 for salvage therapy.  Plan: Continue IV steroids Continue Miralax 17 g daily, may need to increase to BID Remicade dose can be repeated on 10/22 for salvage therapy If improvement, consider outpatient IBD  clinic/surgery eval at tertiary care facility If no improvement, continue with possible transfer to tertiary care facility   LOS: 15 days    02/18/2021, 9:04 AM   Kaydra Borgen L. CAlver Sorrow MSN, APRN, AGNP-C Adult-Gerontology Nurse Practitioner [facility]

## 2021-02-18 NOTE — Consult Note (Signed)
[facility] Consult  Reason for Consult: Sigmoid stricture, Crohn's  Referring Physician: Dr. [name] Dr. Abbey Chatters   Chief Complaint   Rectal Bleeding     HPI: [name] is a 50 y.o. female with a history of COPD, diabetes, HTN, GERD, IBS and a diagnosis of Crohn's disease in 1997 but records are no available. She has not been following with any GI for this condition. She presented to the hospital with BRBPR and abdominal pain.  She was admitted on 02/03/2021. She had some sharp pain, nausea and vomiting at that time. She had been at [facility] 9/16-9/24 being treated for colitis but was not able to tolerate that at home.  She had a flexible sigmoidoscopy 10/7 that demonstrated a sigmoid stricture, colitis extending up the rectum, and biopsies that were not diagnostic. She received SoluMedrol and has since received Remicade. She has been on a soft diet, but denies any recent normal BM or flatus. When she does have a BM it is very small spoon full of liquid BM. She had this on 10/18.    KUB shows stool in the colon.  Attempts at transfer to a tertiary care center have not been successful due to bed shortages.    Past Medical History:  Diagnosis Date   Anxiety    Asthma    Chronic low back pain    Collagen vascular disease (HCC)    COPD (chronic obstructive pulmonary disease) (HCC)    Crohn's disease (HCC)    GERD (gastroesophageal reflux disease)    EGD 01/2007 by Dr.Rourke small hiatal hernia s/p 56 french maloney    History of head injury    Hypertension    IBS (irritable bowel syndrome)    PSVT (paroxysmal supraventricular tachycardia) (HCC)    PTSD (post-traumatic stress disorder)    Recurrent chest pain    Seizure disorder ([place])    Type 2 diabetes mellitus ([place])     Past Surgical History:  Procedure Laterality Date   ABDOMINAL HYSTERECTOMY     with right salpingo oophorectomy 2005   APPENDECTOMY  2006   BIOPSY  02/06/2021   Procedure: BIOPSY;  Surgeon:  Eloise Harman, DO;  [facility];  Service: Endoscopy;;   CESAREAN SECTION     1996   CHOLECYSTECTOMY     COLONOSCOPY  2010   Dr. Gala Romney; negative except for hemorrhoids   ESOPHAGEAL DILATION N/A 10/25/2014   Procedure: ESOPHAGEAL DILATION;  Surgeon: Rogene Houston, MD;  Location: [facility];  Service: Endoscopy;  Laterality: N/A;   ESOPHAGOGASTRODUODENOSCOPY N/A 10/25/2014   Procedure: ESOPHAGOGASTRODUODENOSCOPY (EGD);  Surgeon: Rogene Houston, MD;  Location: [facility];  Service: Endoscopy;  Laterality: N/A;  [place] N/A 02/06/2021   Procedure: FLEXIBLE SIGMOIDOSCOPY;  Surgeon: Eloise Harman, DO;  Location: [facility];  Service: Endoscopy;  Laterality: N/A;   OOPHORECTOMY     left for torsion and ovarian fibroma; uterine myoma resected 1995    Family History  Problem Relation Age of Onset   Cancer Mother    Heart failure Mother    Hypertension Mother    Heart attack Mother    Heart attack Father 85   Cancer Father 7   Heart failure Father    Diabetes Father    Crohn's disease Cousin     Social History   Tobacco Use   Smoking status: Some Days    Packs/day: 0.50    Years: 20.00  Pack years: 10.00    Types: Cigarettes    Start date: 09/26/1984   Smokeless tobacco: Never  Substance Use Topics   Alcohol use: No    Alcohol/week: 0.0 standard drinks   Drug use: Not Currently    Medications: I have reviewed the patient's current medications. Prior to Admission:  Medications Prior to Admission  Medication Sig Dispense Refill Last Dose   acetaminophen (TYLENOL) 325 MG tablet Take 2 tablets (650 mg total) by mouth every 6 (six) hours as needed for mild pain or fever (or Fever >/= 101). 12 tablet 0 PRN   albuterol (VENTOLIN HFA) 108 (90 Base) MCG/ACT inhaler Inhale 2 puffs into the lungs every 4 (four) hours as needed for wheezing or shortness of breath. 18 g 1 PRN   ALPRAZolam (XANAX) 1 MG tablet Take 1 mg by mouth 3 (three)  times daily as needed for anxiety.   Past Week   amitriptyline (ELAVIL) 75 MG tablet Take 75 mg by mouth at bedtime.   Past Week   cetirizine (ZYRTEC) 10 MG tablet Take 10 mg by mouth daily.   Past Week   cyclobenzaprine (FLEXERIL) 10 MG tablet Take 10 mg by mouth 3 (three) times daily as needed for muscle spasms.   Past Week   diphenoxylate-atropine (LOMOTIL) 2.5-0.025 MG tablet Take 1 tablet 4 (four) times daily as needed by mouth for diarrhea or loose stools. 15 tablet 0 PRN   DULoxetine (CYMBALTA) 30 MG capsule Take 30 mg by mouth daily.   Past Week   ezetimibe (ZETIA) 10 MG tablet Take 10 mg by mouth daily.   Past Week   gabapentin (NEURONTIN) 300 MG capsule Take 300 mg by mouth 3 (three) times daily.   Past Week   hydrOXYzine (ATARAX/VISTARIL) 25 MG tablet Take 1 tablet by mouth at bedtime.   Past Week   levETIRAcetam (KEPPRA) 750 MG tablet Take 750 mg by mouth 2 (two) times daily.   Past Week   omeprazole (PRILOSEC) 40 MG capsule Take 40 mg by mouth daily.   Past Week   ondansetron (ZOFRAN ODT) 8 MG disintegrating tablet Take 1 tablet (8 mg total) every 8 (eight) hours as needed by mouth for nausea or vomiting. 15 tablet 0 PRN   promethazine (PHENERGAN) 25 MG tablet Take 1 tablet (25 mg total) by mouth every 6 (six) hours as needed for nausea or vomiting. 12 tablet 0 02/03/2021   RESTASIS 0.05 % ophthalmic emulsion Place 1 drop into both eyes 2 (two) times daily.   Past Week   SPIRIVA RESPIMAT 2.5 MCG/ACT AERS Inhale 1 puff into the lungs daily as needed (shortness of breath). 4 g 1 PRN   topiramate (TOPAMAX) 25 MG tablet Take 1 tablet by mouth in the morning and at bedtime.   Past Week   folic acid (FOLVITE) 1 MG tablet Take 1 tablet (1 mg total) by mouth daily. (Patient not taking: No sig reported) 30 tablet 2 Not Taking   nitroGLYCERIN (NITROSTAT) 0.4 MG SL tablet Place 1 tablet (0.4 mg total) under the tongue every 5 (five) minutes as needed for chest pain. 25 tablet 3 PRN   thiamine 100  MG tablet Take 1 tablet (100 mg total) by mouth daily. (Patient not taking: No sig reported) 30 tablet 3 Not Taking   Scheduled:  ALPRAZolam  1 mg Oral TID   amitriptyline  75 mg Oral QHS   Chlorhexidine Gluconate Cloth  6 each Topical Q0600   DULoxetine  30 mg Oral  Daily   ezetimibe  10 mg Oral Daily   hydrocortisone   Rectal BID   levETIRAcetam  750 mg Oral BID   loratadine  10 mg Oral Daily   methylPREDNISolone (SOLU-MEDROL) injection  60 mg Intravenous Daily   mupirocin ointment  1 application Nasal BID   pantoprazole  40 mg Oral Daily   polyethylene glycol  17 g Oral Daily   polyethylene glycol  17 g Oral Once   Continuous: SLH:[id number] **OR** acetaminophen, cyclobenzaprine, fentaNYL (SUBLIMAZE) injection, gabapentin, hydrOXYzine, ipratropium-albuterol, ondansetron (ZOFRAN) IV, polyvinyl alcohol  Allergies  Allergen Reactions   Codeine Shortness Of Breath, Swelling and Rash    Throat swelling   Dilaudid [Hydromorphone Hcl] Shortness Of Breath and Swelling   Hydrocodone Shortness Of Breath, Swelling and Rash   Morphine And Related Shortness Of Breath, Swelling and Rash   Oxycodone Shortness Of Breath, Swelling and Rash    Patient states ALL pain medications make her deathly sick.   Penicillins Shortness Of Breath, Swelling and Other (See Comments)    Has patient had a PCN reaction causing immediate rash, facial/tongue/throat swelling, SOB or lightheadedness with hypotension: Yes Has patient had a PCN reaction causing severe rash involving mucus membranes or skin necrosis: Yes Has patient had a PCN reaction that required hospitalization Yes Has patient had a PCN reaction occurring within the last 10 years: No If all of the above answers are "NO", then may proceed with Cephalosporin use.   Throat swells  02/06/21--TOLERATES CEFTRIAXONE     Acetaminophen     Per MD patient states she can't take Tylenol because of liver enzymes   Ativan [Lorazepam] Other (See Comments)     Migraines.   Strawberry Extract Swelling   Watermelon Concentrate Swelling   Benadryl [Diphenhydramine Hcl] Palpitations   Latex Rash     ROS:  A comprehensive review of systems was negative except for: Gastrointestinal: positive for abdominal pain, diarrhea, and BRBPR Musculoskeletal: positive for back pain  Blood pressure 110/62, pulse 89, temperature (!) 97.5 F (36.4 C), temperature source Oral, resp. rate 17, height 5' 5"  (1.651 m), weight 67.3 kg, SpO2 98 %. Physical Exam Vitals reviewed.  Constitutional:      Appearance: Normal appearance.  HENT:     Head: Normocephalic.     Mouth/Throat:     Mouth: Mucous membranes are moist.  Eyes:     Pupils: Pupils are equal, round, and reactive to light.  Cardiovascular:     Rate and Rhythm: Normal rate.  Pulmonary:     Effort: Pulmonary effort is normal.  Abdominal:     General: There is no distension.     Palpations: Abdomen is soft.     Tenderness: There is abdominal tenderness in the suprapubic area and left lower quadrant.  Musculoskeletal:        General: No swelling.     Cervical back: Normal range of motion.  Skin:    General: Skin is warm.  Neurological:     General: No focal deficit present.     Mental Status: She is alert and oriented to person, place, and time.  Psychiatric:        Mood and Affect: Mood normal.        Behavior: Behavior normal.        Thought Content: Thought content normal.        Judgment: Judgment normal.    Results: Results for orders placed or performed during the hospital encounter of 02/03/21 (from the past 48 hour(s))  Basic metabolic panel     Status: Abnormal   Collection Time: 02/17/21  7:10 AM  Result Value Ref Range   Sodium 137 135 - 145 mmol/L   Potassium 3.1 (L) 3.5 - 5.1 mmol/L   Chloride 103 98 - 111 mmol/L   CO2 26 22 - 32 mmol/L   Glucose, Bld 107 (H) 70 - 99 mg/dL    Comment: Glucose reference range applies only to samples taken after fasting for at least 8  hours.   BUN 10 6 - 20 mg/dL   Creatinine, Ser 1.01 (H) 0.44 - 1.00 mg/dL   Calcium 9.0 8.9 - 10.3 mg/dL   GFR, Estimated >60 >60 mL/min    Comment: (NOTE) Calculated using the CKD-EPI Creatinine Equation (2021)    Anion gap 8 5 - 15    Comment: Performed at [facility], [address], [place], [place] [address]  Magnesium     Status: None   Collection Time: 02/17/21  7:10 AM  Result Value Ref Range   Magnesium 2.0 1.7 - 2.4 mg/dL    Comment: Performed at [facility], [address] [place], [place] [address]  CBC     Status: Abnormal   Collection Time: 02/17/21  7:10 AM  Result Value Ref Range   WBC 7.5 4.0 - 10.5 K/uL   RBC 3.63 (L) 3.87 - 5.11 MIL/uL   Hemoglobin 11.1 (L) 12.0 - 15.0 g/dL   HCT 34.9 (L) 36.0 - 46.0 %   MCV 96.1 80.0 - 100.0 fL   MCH 30.6 26.0 - 34.0 pg   MCHC 31.8 30.0 - 36.0 g/dL   RDW 14.4 11.5 - 15.5 %   Platelets 291 150 - 400 K/uL   nRBC 0.0 0.0 - 0.2 %    Comment: Performed at [facility], [address], [place], [place] [address]  Basic metabolic panel     Status: None   Collection Time: 02/18/21  6:24 AM  Result Value Ref Range   Sodium 137 135 - 145 mmol/L   Potassium 3.7 3.5 - 5.1 mmol/L   Chloride 103 98 - 111 mmol/L   CO2 25 22 - 32 mmol/L   Glucose, Bld 84 70 - 99 mg/dL    Comment: Glucose reference range applies only to samples taken after fasting for at least 8 hours.   BUN 11 6 - 20 mg/dL   Creatinine, Ser 0.87 0.44 - 1.00 mg/dL   Calcium 9.6 8.9 - 10.3 mg/dL   GFR, Estimated >60 >60 mL/min    Comment: (NOTE) Calculated using the CKD-EPI Creatinine Equation (2021)    Anion gap 9 5 - 15    Comment: Performed at [facility], [address], [place], [place] [address]  Magnesium     Status: None   Collection Time: 02/18/21  6:24 AM  Result Value Ref Range   Magnesium 2.1 1.7 - 2.4 mg/dL    Comment: Performed at [facility], [address] [place], [place] [address]   Personally reviewed CT from 10/4 and KUBs- narrowed sigmoid  on CT but no obvious inflammation around the area, stool in colon on KUBs DG Abd 1 View  Result Date: 02/16/2021 CLINICAL DATA:  Abdominal pain, history of Crohn's EXAM: ABDOMEN - 1 VIEW COMPARISON:  February 14, 2021, February 03, 2021 FINDINGS: Air and stool filled nondilated loops of bowel. Status post cholecystectomy. Visualized lung bases are unremarkable. IMPRESSION: Nonobstructive bowel gas pattern. Electronically Signed   By: Valentino Saxon M.D.  On: 02/16/2021 15:24   CT HEAD WO CONTRAST (5MM)  Result Date: 02/16/2021 CLINICAL DATA:  Head trauma, moderate to severe. Right-sided head numbness over the last 2 days. Fell 1 month ago. EXAM: CT HEAD WITHOUT CONTRAST TECHNIQUE: Contiguous axial images were obtained from the base of the skull through the vertex without intravenous contrast. COMPARISON:  01/16/2021 FINDINGS: Brain: The brain shows a normal appearance without evidence of malformation, atrophy, old or acute small or large vessel infarction, mass lesion, hemorrhage, hydrocephalus or extra-axial collection. Vascular: No hyperdense vessel. No evidence of atherosclerotic calcification. Skull: Normal.  No traumatic finding.  No focal bone lesion. Sinuses/Orbits: Sinuses are clear. Orbits appear normal. Mastoids are clear. Other: None significant IMPRESSION: Normal head CT. Electronically Signed   By: [place] Chimes M.D.   On: 02/16/2021 12:50     Assessment & Plan:  [name] is a 50 y.o. female with sigmoid stricture and concern for active Crohn's colitis given her history. The prior flexible sigmoidoscopy was not diagnostic for Crohn's. Discussed the case thoroughly with Dr. Laural Golden and agree with repeat flexible sigmoidoscopy. Discussed with patient and family that we try to avoid operating on someone with Crohn's given the nuances and the need for multidisciplinary approach. Patient's with perforations or emergency needs do get surgeries outside of those parameters if needed.    Repeat Flexible sigmoidoscopy tomorrow with Dr. Abbey Chatters.  Will await the results for further suggestions.   All questions were answered to the satisfaction of the patient and family.   Virl Cagey 02/18/2021, 7:39 AM

## 2021-02-19 ENCOUNTER — Encounter (HOSPITAL_COMMUNITY)
Admission: EM | Disposition: A | Discharge status: Home / Self Care | Payer: Self-pay | Source: Home / Self Care | Attending: Family Medicine

## 2021-02-19 ENCOUNTER — Inpatient Hospital Stay (HOSPITAL_COMMUNITY): Payer: 59 | Admitting: Certified Registered Nurse Anesthetist

## 2021-02-19 ENCOUNTER — Other Ambulatory Visit: Payer: Self-pay

## 2021-02-19 ENCOUNTER — Encounter (HOSPITAL_COMMUNITY): Payer: Self-pay | Admitting: Internal Medicine

## 2021-02-19 DIAGNOSIS — K529 Noninfective gastroenteritis and colitis, unspecified: Secondary | ICD-10-CM | POA: Diagnosis not present

## 2021-02-19 HISTORY — PX: BALLOON DILATION: SHX5330

## 2021-02-19 HISTORY — PX: BIOPSY: SHX5522

## 2021-02-19 HISTORY — PX: FLEXIBLE SIGMOIDOSCOPY: SHX5431

## 2021-02-19 LAB — BASIC METABOLIC PANEL
Anion gap: 9 (ref 5–15)
BUN: 13 mg/dL (ref 6–20)
CO2: 24 mmol/L (ref 22–32)
Calcium: 9.6 mg/dL (ref 8.9–10.3)
Chloride: 104 mmol/L (ref 98–111)
Creatinine, Ser: 1.01 mg/dL — ABNORMAL HIGH (ref 0.44–1.00)
GFR, Estimated: >60 mL/min (ref >60)
Glucose, Bld: 102 mg/dL — ABNORMAL HIGH (ref 70–99)
Potassium: 3.7 mmol/L (ref 3.5–5.1)
Sodium: 137 mmol/L (ref 135–145)

## 2021-02-19 LAB — MAGNESIUM: Magnesium: 2.2 mg/dL (ref 1.7–2.4)

## 2021-02-19 LAB — CBC
HCT: 37.7 % (ref 36.0–46.0)
Hemoglobin: 11.8 g/dL — ABNORMAL LOW (ref 12.0–15.0)
MCH: 30.7 pg (ref 26.0–34.0)
MCHC: 31.3 g/dL (ref 30.0–36.0)
MCV: 98.2 fL (ref 80.0–100.0)
Platelets: 337 10*3/uL (ref 150–400)
RBC: 3.84 MIL/uL — ABNORMAL LOW (ref 3.87–5.11)
RDW: 14.3 % (ref 11.5–15.5)
WBC: 9.8 10*3/uL (ref 4.0–10.5)
nRBC: 0 % (ref 0.0–0.2)

## 2021-02-19 SURGERY — SIGMOIDOSCOPY, FLEXIBLE
Anesthesia: General

## 2021-02-19 MED ORDER — BACITRACIN ZINC 500 UNIT/GM EX OINT
TOPICAL_OINTMENT | Freq: Two times a day (BID) | CUTANEOUS | Status: DC
  Filled 2021-02-19: qty 0.9

## 2021-02-19 MED ORDER — LACTATED RINGERS IV SOLN: INTRAVENOUS | Status: DC | PRN

## 2021-02-19 MED ORDER — SODIUM CHLORIDE 0.9 % IV SOLN: INTRAVENOUS | Status: DC

## 2021-02-19 MED ORDER — LIDOCAINE HCL (CARDIAC) PF 100 MG/5ML IV SOSY
PREFILLED_SYRINGE | INTRAVENOUS | Status: DC | PRN
  Administered 2021-02-19: 50 mg via INTRAVENOUS

## 2021-02-19 MED ORDER — LACTATED RINGERS IV SOLN: INTRAVENOUS | Status: DC

## 2021-02-19 MED ORDER — PROPOFOL 10 MG/ML IV BOLUS
INTRAVENOUS | Status: DC | PRN
  Administered 2021-02-19: 100 mg via INTRAVENOUS
  Administered 2021-02-19 (×2): 50 mg via INTRAVENOUS

## 2021-02-19 NOTE — Op Note (Signed)
[facility] Patient Name: [name] Procedure Date: 02/19/2021 9:31 AM MRN: [id number] Date of Birth: 05/12/1970 Attending MD: Elon Alas. Abbey Chatters DO CSN: [id number] Age: 50 Admit Type: Inpatient Procedure:                Flexible Sigmoidoscopy Indications:              Colitis, Sigmoid stricture Providers:                Elon Alas. Abbey Chatters, DO, Caprice Kluver, Raphael Gibney,                            Technician Referring MD:              Medicines:                See the Anesthesia note for documentation of the                            administered medications Complications:            No immediate complications. Estimated Blood Loss:     Estimated blood loss was minimal. Procedure:                Pre-Anesthesia Assessment:                           - The anesthesia plan was to use monitored                            anesthesia care (MAC).                           After obtaining informed consent, the scope was                            passed under direct vision. The [phone])                            scope was introduced through the anus and advanced                            to the the descending colon. The flexible                            sigmoidoscopy was accomplished without difficulty.                            The patient tolerated the procedure well. The                            quality of the bowel preparation was good. Scope In: 9:50:34 AM Scope Out: 10:03:33 AM Total Procedure Duration: 0 hours 12 minutes 59 seconds  Findings:      Diffuse severe inflammation characterized by erosions and erythema was       found in the rectum and in the sigmoid colon. Does appear improved       compared to prior sigmoidoscopy. Less sloughing and edema noted Biopsies  were taken with a cold forceps for histology.      A benign-appearing, intrinsic severe stenosis found again in the sigmoid       colon approx 15 cm from the anal verge measuring 5 cm  (in length) x 8 mm       (inner diameter) and was traversed with Ultra-slim colonoscope. A TTS       dilator was passed through the scope. Dilation with an 12-09-08 mm and a       12-13.5-15 mm colonic balloon dilator was performed up to maximum of 15       mm. The dilation site was examined and showed mild mucosal disruption       and moderate improvement in luminal narrowing. Impression:               - Diffuse severe inflammation was found in the                            rectum and in the sigmoid colon secondary to                            colitis. Biopsied.                           - Stricture in the sigmoid colon. Dilated. Moderate Sedation:      Per Anesthesia Care Recommendation:           - Return patient to hospital ward for ongoing care.                           - Await pathology results. I put a rush order on                            path and will add CMV testing.                           - Continue IV steroids. Hopefully patient improves                            status post dilation of her sigmoid stricture.                            Colonic mucosa did look improved compared to prior.                            Consider second dose of Remicade on 02/21/2021 for                            salvage therapy. She will likely have rectal                            bleeding after procedure given how friable her                            tissue is. Continue to monitor H&H and transfuse  for less than 7. Procedure Code(s):        --- Professional ---                           [phone], Sigmoidoscopy, flexible; with                            transendoscopic balloon dilation                           93570, 89, Sigmoidoscopy, flexible; with biopsy,                            single or multiple Diagnosis Code(s):        --- Professional ---                           K52.9, Noninfective gastroenteritis and colitis,                             unspecified                           K56.699, Other intestinal obstruction unspecified                            as to partial versus complete obstruction CPT copyright 2019 [employer] Association. All rights reserved. The codes documented in this report are preliminary and upon coder review may  be revised to meet current compliance requirements. Elon Alas. Abbey Chatters, DO [place], DO 02/19/2021 10:17:43 AM This report has been signed electronically. Number of Addenda: 0

## 2021-02-19 NOTE — Interval H&P Note (Signed)
History and Physical Interval Note:  02/19/2021 9:31 AM  [name]  has presented today for surgery, with the diagnosis of Assess response to therapy of colitis and possible stricture dilation..  The various methods of treatment have been discussed with the patient and family. After consideration of risks, benefits and other options for treatment, the patient has consented to  Procedure(s) with comments: FLEXIBLE SIGMOIDOSCOPY (N/A) BALLOON DILATION (N/A) - Sigmoid colon stricture as a surgical intervention.  The patient's history has been reviewed, patient examined, no change in status, stable for surgery.  I have reviewed the patient's chart and labs.  Questions were answered to the patient's satisfaction.     Eloise Harman

## 2021-02-19 NOTE — Progress Notes (Signed)
PROGRESS NOTE    [name]  JAS:[id number] DOB: 09-28-1970 DOA: 02/03/2021 PCP: Teodoro Kil, PA-C   Brief Narrative:  50 year old female with a history of: anxiety, asthma, chronic low back pain, collagen vascular disease, copd, chrons, gerd, htn, ibs, dm, and a seizure disorder. She presented with hematochezia, nausea, and vomiting. She had been recently admitted 01/16/21-01/24/21 due to sepsis secondary to colitis. She had been discharged on PO antibiotics, did not comply with her antibiotics, and has returned with colitis symptoms. GI has been consulted. She reports that she has had numerous loose bowel movements in the preceding days, reports 10/14 she has improved RLQ pain, but continues with blood with bowel movement and wiping. She denies current nausea and vomiting. Currently continuing IV steroids.   Transfer to tertiary centers pursued but unable to find a bed. 10/20, flex sig with some improvement of inflammation as per GI and able to dilate the stricture.  Assessment & Plan:   Principal Problem:   Colitis Active Problems:   TOBACCO ABUSE   Constipation   COPD (chronic obstructive pulmonary disease) (HCC)   Diabetes mellitus without complication ([place])   ALPFX-90 virus infection   Diarrhea   Hematochezia   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HCC)   Bloating   Abdominal pain   Crohn's disease with colitis and sigmoid colon stricture:  -10/7 flex sig--Diffuse severe inflammation characterized by erosions, erythema and shallow ulcerations was found in the rectum and in the sigmoid colon extending up to approx 15 cm from the anal verge.  GI consulted.  Started on IV steroids.  Given a dose of Remicade infusion on 10/15 for salvage therapy. Multiple discussions with gastroenterology at [facility] transfer line and at [facility] and they recommended discharging and going back as outpatient. -10/20, flexible sigmoidoscopy done.  Reported some improvement of inflammation and also  able to dilate the sigmoid stricture.  Plan is to continue IV steroids and change to oral steroids on discharge, repeat Remicade infusion on 10/22 and refer to [facility] for follow-up. - on home flexeril and elavil to augment pain control.     TOBACCO ABUSE -cessation discussed     COPD (chronic obstructive pulmonary disease) (HCC) -no current exacerbation, on room air -duoneb prn -tiotropium prn     Diarrhea with hypokalemia and hypomagnesemia -monitoring her electrolytes daily.  Adequate today. -potassium repleted orally, with ongoing diarrhea, we will continue to follow it closely.      Hematochezia -Likely secondary to the patient's colitis, reports continued sporadic bright red blood with bowel movements and wiping which continues to persist even after IV steroids and remicade infusion Hemoglobin is stable.     Anxiety -on her home dose of Xanax, 12m tid   Seizure disorder -on her home keppra dose and no seizure activity      Diabetes mellitus without complication  -no current medications, no diet restrictions,  -continue eating soft diet, patient having some regular foods brought in -  has been diet controlled   DVT prophylaxis: SCDs Code Status: Full Family Communication: Husband at bedside  Disposition Plan:  Status is: Inpatient  Remains inpatient appropriate because: Ongoing need for IV steroids, still symptomatic.  Electrolyte abnormalities.  Consultants:  GI General surgery  Procedures:  Flex sigmoidoscopy  Antimicrobials:  Ceftriaxone 10/5-10/7 Ciprofloxacin 10/4-/10/5 Metronidazole 10/4-10/7  Subjective: Patient seen and examined.  She came back from procedure.  She was expecting to have a good bowel movement, control her symptoms and go home.  On my  examination denied any complaints.  Objective: Vitals:   02/19/21 1015 02/19/21 1021 02/19/21 1040 02/19/21 1326  BP: 109/67  131/78 122/74  Pulse: 93  100 (!) 114  Resp: 14  16 20   Temp:  (!) 97.4 F  (36.3 C) 97.9 F (36.6 C) 97.8 F (36.6 C)  TempSrc:   Oral Oral  SpO2: 97%   100%  Weight:      Height:        Intake/Output Summary (Last 24 hours) at 02/19/2021 1341 Last data filed at 02/19/2021 1007 Gross per 24 hour  Intake 950 ml  Output 0 ml  Net 950 ml   Filed Weights   02/03/21 1140 02/04/21 1546  Weight: 73.3 kg 67.3 kg    Examination:  General exam: Appears calm and comfortable  Respiratory system: Clear to auscultation. Respiratory effort normal. Cardiovascular system: S1 & S2 heard, RRR.  Gastrointestinal system: Abdomen is soft Central nervous system: Alert and awake Extremities: No edema Psychiatry: Flat affect.    Data Reviewed: I have personally reviewed following labs and imaging studies  CBC: Recent Labs  Lab 02/15/21 0422 02/16/21 0522 02/17/21 0710 02/18/21 0745 02/19/21 0545  WBC 6.5 6.3 7.5 8.2 9.8  HGB 11.4* 11.1* 11.1* 11.2* 11.8*  HCT 35.3* 33.9* 34.9* 36.1 37.7  MCV 95.4 95.5 96.1 97.8 98.2  PLT 398 362 291 352 680   Basic Metabolic Panel: Recent Labs  Lab 02/14/21 0453 02/15/21 0422 02/16/21 0522 02/17/21 0710 02/18/21 0624 02/19/21 0545  NA 139 138 137 137 137 137  K 3.2* 3.3* 3.7 3.1* 3.7 3.7  CL 103 103 104 103 103 104  CO2 28 27 27 26 25 24   GLUCOSE 84 89 92 107* 84 102*  BUN 11 10 12 10 11 13   CREATININE 0.84 0.79 0.89 1.01* 0.87 1.01*  CALCIUM 8.9 9.2 9.2 9.0 9.6 9.6  MG 1.9 2.0  --  2.0 2.1 2.2   GFR: Estimated Creatinine Clearance: 59.3 mL/min (A) (by C-G formula based on SCr of 1.01 mg/dL (H)). Liver Function Tests: No results for input(s): AST, ALT, ALKPHOS, BILITOT, PROT, ALBUMIN in the last 168 hours. No results for input(s): LIPASE, AMYLASE in the last 168 hours. No results for input(s): AMMONIA in the last 168 hours. Coagulation Profile: No results for input(s): INR, PROTIME in the last 168 hours. Cardiac Enzymes: No results for input(s): CKTOTAL, CKMB, CKMBINDEX, TROPONINI in the last 168  hours. BNP (last 3 results) No results for input(s): PROBNP in the last 8760 hours. HbA1C: No results for input(s): HGBA1C in the last 72 hours. CBG: No results for input(s): GLUCAP in the last 168 hours. Lipid Profile: No results for input(s): CHOL, HDL, LDLCALC, TRIG, CHOLHDL, LDLDIRECT in the last 72 hours. Thyroid Function Tests: No results for input(s): TSH, T4TOTAL, FREET4, T3FREE, THYROIDAB in the last 72 hours. Anemia Panel: No results for input(s): VITAMINB12, FOLATE, FERRITIN, TIBC, IRON, RETICCTPCT in the last 72 hours. Sepsis Labs: No results for input(s): PROCALCITON, LATICACIDVEN in the last 168 hours.  Recent Results (from the past 240 hour(s))  MRSA Next Gen by PCR, Nasal     Status: Abnormal   Collection Time: 02/14/21  2:29 PM   Specimen: Nasal Mucosa; Nasal Swab  Result Value Ref Range Status   MRSA by PCR Next Gen DETECTED (A) NOT DETECTED Final    Comment:  L.HILTON 1234 02/15/21 BY STEPHTR (NOTE) The GeneXpert MRSA Assay (FDA approved for NASAL specimens only), is one component of a comprehensive MRSA  colonization surveillance program. It is not intended to diagnose MRSA infection nor to guide or monitor treatment for MRSA infections. Test performance is not FDA approved in patients less than 66 years old. Performed at [facility], [address] [place], [place] [address]          Radiology Studies: No results found.      Scheduled Meds:  ALPRAZolam  1 mg Oral TID   amitriptyline  75 mg Oral QHS   Chlorhexidine Gluconate Cloth  6 each Topical Q0600   DULoxetine  30 mg Oral Daily   ezetimibe  10 mg Oral Daily   hydrocortisone   Rectal BID   levETIRAcetam  750 mg Oral BID   loratadine  10 mg Oral Daily   methylPREDNISolone (SOLU-MEDROL) injection  60 mg Intravenous Daily   mupirocin ointment  1 application Nasal BID   pantoprazole  40 mg Oral Daily   polyethylene glycol  17 g Oral Daily     LOS: 16 days    Time spent: 34  minutes    Barb Merino, MD [facility]  If 7PM-7AM, please contact night-coverage [email or web address] 02/19/2021, 1:41 PM

## 2021-02-19 NOTE — Anesthesia Preprocedure Evaluation (Addendum)
Anesthesia Evaluation  Patient identified by MRN, date of birth, ID band Patient awake    Reviewed: Allergy & Precautions, NPO status , Patient's Chart, lab work & pertinent test results  Airway Mallampati: III  TM Distance: >3 FB Neck ROM: Full    Dental  (+) Dental Advisory Given, Chipped   Pulmonary shortness of breath and with exertion, asthma , COPD,  COPD inhaler, Current Smoker,    Pulmonary exam normal breath sounds clear to auscultation       Cardiovascular hypertension, Pt. on medications Normal cardiovascular exam Rhythm:Regular Rate:Normal  1. Left ventricular ejection fraction, by estimation, is 60 to 65%. The left ventricle has normal function.  2. Moderate pericardial effusion. The pericardial effusion is circumferential. There is no evidence of cardiac tamponade. Effusion is stable compared to the 01/19/21 study  3. The inferior vena cava is normal in size with greater than 50% respiratory variability, suggesting right atrial pressure of 3 mmHg.  4. Limited echo to evaluate pericardial effusion    Neuro/Psych Seizures - (last seizure - 3 months ago), Well Controlled,  PSYCHIATRIC DISORDERS Anxiety  Neuromuscular disease    GI/Hepatic Neg liver ROS, GERD  Medicated,  Endo/Other  diabetes, Well Controlled, Type 2, Oral Hypoglycemic Agents  Renal/GU Renal disease     Musculoskeletal   Abdominal   Peds  Hematology   Anesthesia Other Findings   Reproductive/Obstetrics                            Anesthesia Physical Anesthesia Plan  ASA: 3  Anesthesia Plan: General   Post-op Pain Management:    Induction: Intravenous  PONV Risk Score and Plan: TIVA  Airway Management Planned: Natural Airway and Nasal Cannula  Additional Equipment:   Intra-op Plan:   Post-operative Plan:   Informed Consent:     Dental advisory given  Plan Discussed with: CRNA and  Surgeon  Anesthesia Plan Comments:         Anesthesia Quick Evaluation

## 2021-02-19 NOTE — Anesthesia Postprocedure Evaluation (Signed)
Anesthesia Post Note  Patient: [name]  Procedure(s) Performed: [place] BIOPSY  Patient location during evaluation: PACU Anesthesia Type: General Level of consciousness: awake and alert and oriented Pain management: pain level controlled Vital Signs Assessment: post-procedure vital signs reviewed and stable Respiratory status: spontaneous breathing, nonlabored ventilation and respiratory function stable Cardiovascular status: blood pressure returned to baseline and stable Postop Assessment: no apparent nausea or vomiting Anesthetic complications: no   No notable events documented.   Last Vitals:  Vitals:   02/19/21 1326 02/19/21 1328  BP: 122/74   Pulse: (!) 114 100  Resp: 20   Temp: 36.6 C   SpO2: 100%     Last Pain:  Vitals:   02/19/21 1726  TempSrc:   PainSc: 3                  Hanny Elsberry C Beauregard Jarrells

## 2021-02-19 NOTE — Transfer of Care (Signed)
Immediate Anesthesia Transfer of Care Note  Patient: [name]  Procedure(s) Performed: FLEXIBLE SIGMOIDOSCOPY BALLOON DILATION BIOPSY  Patient Location: PACU  Anesthesia Type:General  Level of Consciousness: awake  Airway & Oxygen Therapy: Patient Spontanous Breathing  Post-op Assessment: Report given to RN and Post -op Vital signs reviewed and stable  Post vital signs: Reviewed and stable  Last Vitals:  Vitals Value Taken Time  BP    Temp    Pulse 97 02/19/21 1007  Resp    SpO2 98 % 02/19/21 1007  Vitals shown include unvalidated device data.  Last Pain:  Vitals:   02/19/21 0947  TempSrc:   PainSc: 0-No pain      Patients Stated Pain Goal: 0 (34/03/70 9643)  Complications: No notable events documented.

## 2021-02-20 DIAGNOSIS — R140 Abdominal distension (gaseous): Secondary | ICD-10-CM

## 2021-02-20 DIAGNOSIS — K529 Noninfective gastroenteritis and colitis, unspecified: Secondary | ICD-10-CM

## 2021-02-20 DIAGNOSIS — R1084 Generalized abdominal pain: Secondary | ICD-10-CM

## 2021-02-20 DIAGNOSIS — K5900 Constipation, unspecified: Secondary | ICD-10-CM

## 2021-02-20 DIAGNOSIS — K56699 Other intestinal obstruction unspecified as to partial versus complete obstruction: Secondary | ICD-10-CM

## 2021-02-20 MED ORDER — POLYETHYLENE GLYCOL 3350 17 G PO PACK
17.0000 g | PACK | Freq: Three times a day (TID) | ORAL | Status: AC
  Administered 2021-02-20 – 2021-02-22 (×6): 17 g via ORAL
  Filled 2021-02-20 (×6): qty 1

## 2021-02-20 MED ORDER — PREDNISONE 20 MG PO TABS
40.0000 mg | ORAL_TABLET | Freq: Every day | ORAL | Status: DC
  Administered 2021-02-21 – 2021-02-22 (×2): 40 mg via ORAL
  Filled 2021-02-20 (×2): qty 2

## 2021-02-20 MED ORDER — SODIUM CHLORIDE 0.9 % IV SOLN
5.0000 mg/kg | Freq: Once | INTRAVENOUS | Status: AC
  Administered 2021-02-21: 300 mg via INTRAVENOUS
  Filled 2021-02-20: qty 30

## 2021-02-20 MED ORDER — CALCIUM CARBONATE ANTACID 500 MG PO CHEW
1.0000 | CHEWABLE_TABLET | Freq: Three times a day (TID) | ORAL | Status: DC
  Administered 2021-02-20 – 2021-02-22 (×6): 200 mg via ORAL
  Filled 2021-02-20 (×6): qty 1

## 2021-02-20 NOTE — Progress Notes (Signed)
PROGRESS NOTE    [name]  BHA:[id number] DOB: Oct 11, 1970 DOA: 02/03/2021 PCP: Teodoro Kil, PA-C   Brief Narrative:  50 year old female with a history of anxiety, asthma, chronic low back pain, collagen vascular disease, copd, chrons, gerd, htn, ibs, dm, and a seizure disorder. She presented with hematochezia, nausea, and vomiting. She was recently admitted 01/16/21-01/24/21 due to sepsis secondary to colitis. She had been discharged on PO antibiotics, did not comply with her antibiotics, and has returned with colitis symptoms. GI has been consulted.  Remains in the hospital with ongoing abdominal pain, unable to have bowel movements and rectal bleeding.  Currently on IV steroids and also received Remicade.  Assessment & Plan:   Principal Problem:   Colitis Active Problems:   TOBACCO ABUSE   Constipation   COPD (chronic obstructive pulmonary disease) (HCC)   Diabetes mellitus without complication ([place])   XBDZH-29 virus infection   Diarrhea   Hematochezia   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HCC)   Bloating   Abdominal pain   Crohn's disease with colitis and sigmoid colon stricture:  -10/7 flex sig--Diffuse severe inflammation characterized by erosions, erythema and shallow ulcerations was found in the rectum and in the sigmoid colon extending up to approx 15 cm from the anal verge.  GI consulted.  Started on IV steroids.  Given a dose of Remicade infusion on 10/15 for salvage therapy. Multiple discussions with gastroenterology at [facility] transfer line and at [facility] and they recommended discharging and going back as outpatient. -10/20, flexible sigmoidoscopy done.  Reported some improvement of inflammation and also able to dilate the sigmoid stricture.  Plan is to continue IV steroids and change to oral steroids on discharge, repeat Remicade infusion on 10/22 and refer to [facility] for follow-up. - on home flexeril and elavil to augment pain control. -No meaningful bowel movements  yet.     TOBACCO ABUSE -cessation discussed     COPD (chronic obstructive pulmonary disease) (HCC) -no current exacerbation, on room air -duoneb prn -tiotropium prn     Diarrhea with hypokalemia and hypomagnesemia -monitoring her electrolytes daily.  Adequate today. -potassium repleted orally, with ongoing diarrhea, we will continue to follow it closely.      Hematochezia -Likely secondary to the patient's colitis, reports continued sporadic bright red blood with bowel movements and wiping which continues to persist even after IV steroids and remicade infusion Hemoglobin is stable.     Anxiety -on her home dose of Xanax, 11m tid   Seizure disorder -on her home keppra dose and no seizure activity      Diabetes mellitus without complication  -no current medications, no diet restrictions,  -continue eating soft diet, patient having some regular foods brought in -  has been diet controlled   DVT prophylaxis: SCDs Code Status: Full Family Communication: Husband at bedside  Disposition Plan:  Status is: Inpatient  Remains inpatient appropriate because: Ongoing need for IV steroids, still symptomatic.  Electrolyte abnormalities.  Consultants:  GI General surgery  Procedures:  Flex sigmoidoscopy  Antimicrobials:  Ceftriaxone 10/5-10/7 Ciprofloxacin 10/4-/10/5 Metronidazole 10/4-10/7  Subjective: Patient seen and examined.  Denies any nausea vomiting.  She is able to eat well but not able to have any bowel movement.  She has some distention but mostly pain of the abdomen. She had a small amount of bright red blood come out, has urgency but no bowel movement.  She was wondering whether she can use more MiraLAX,   Objective: Vitals:   02/19/21  1328 02/19/21 2016 02/20/21 0522 02/20/21 0848  BP:  (!) 99/56 126/70 130/83  Pulse: 100 91 (!) 103 (!) 102  Resp:      Temp:  97.8 F (36.6 C) 97.6 F (36.4 C)   TempSrc:  Oral Oral   SpO2:  95% 97% 98%  Weight:       Height:        Intake/Output Summary (Last 24 hours) at 02/20/2021 1137 Last data filed at 02/20/2021 0900 Gross per 24 hour  Intake 1560 ml  Output --  Net 1560 ml   Filed Weights   02/03/21 1140 02/04/21 1546  Weight: 73.3 kg 67.3 kg    Examination:  General exam: Appears calm and comfortable .  On room air.  Walking around inside the room. Respiratory system: Clear to auscultation. Respiratory effort normal. Cardiovascular system: S1 & S2 heard, RRR.  Gastrointestinal system: Abdomen is soft, she has moderate tenderness on deep palpation all over the abdomen and mostly on the left lower quadrant.  Bowel sounds present. Central nervous system: Alert and awake Extremities: No edema Psychiatry: Normal affect and mood.    Data Reviewed: I have personally reviewed following labs and imaging studies  CBC: Recent Labs  Lab 02/15/21 0422 02/16/21 0522 02/17/21 0710 02/18/21 0745 02/19/21 0545  WBC 6.5 6.3 7.5 8.2 9.8  HGB 11.4* 11.1* 11.1* 11.2* 11.8*  HCT 35.3* 33.9* 34.9* 36.1 37.7  MCV 95.4 95.5 96.1 97.8 98.2  PLT 398 362 291 352 449   Basic Metabolic Panel: Recent Labs  Lab 02/14/21 0453 02/15/21 0422 02/16/21 0522 02/17/21 0710 02/18/21 0624 02/19/21 0545  NA 139 138 137 137 137 137  K 3.2* 3.3* 3.7 3.1* 3.7 3.7  CL 103 103 104 103 103 104  CO2 28 27 27 26 25 24   GLUCOSE 84 89 92 107* 84 102*  BUN 11 10 12 10 11 13   CREATININE 0.84 0.79 0.89 1.01* 0.87 1.01*  CALCIUM 8.9 9.2 9.2 9.0 9.6 9.6  MG 1.9 2.0  --  2.0 2.1 2.2   GFR: Estimated Creatinine Clearance: 59.3 mL/min (A) (by C-G formula based on SCr of 1.01 mg/dL (H)). Liver Function Tests: No results for input(s): AST, ALT, ALKPHOS, BILITOT, PROT, ALBUMIN in the last 168 hours. No results for input(s): LIPASE, AMYLASE in the last 168 hours. No results for input(s): AMMONIA in the last 168 hours. Coagulation Profile: No results for input(s): INR, PROTIME in the last 168 hours. Cardiac  Enzymes: No results for input(s): CKTOTAL, CKMB, CKMBINDEX, TROPONINI in the last 168 hours. BNP (last 3 results) No results for input(s): PROBNP in the last 8760 hours. HbA1C: No results for input(s): HGBA1C in the last 72 hours. CBG: No results for input(s): GLUCAP in the last 168 hours. Lipid Profile: No results for input(s): CHOL, HDL, LDLCALC, TRIG, CHOLHDL, LDLDIRECT in the last 72 hours. Thyroid Function Tests: No results for input(s): TSH, T4TOTAL, FREET4, T3FREE, THYROIDAB in the last 72 hours. Anemia Panel: No results for input(s): VITAMINB12, FOLATE, FERRITIN, TIBC, IRON, RETICCTPCT in the last 72 hours. Sepsis Labs: No results for input(s): PROCALCITON, LATICACIDVEN in the last 168 hours.  Recent Results (from the past 240 hour(s))  MRSA Next Gen by PCR, Nasal     Status: Abnormal   Collection Time: 02/14/21  2:29 PM   Specimen: Nasal Mucosa; Nasal Swab  Result Value Ref Range Status   MRSA by PCR Next Gen DETECTED (A) NOT DETECTED Final    Comment:  L.HILTON 1234  02/15/21 BY STEPHTR (NOTE) The GeneXpert MRSA Assay (FDA approved for NASAL specimens only), is one component of a comprehensive MRSA colonization surveillance program. It is not intended to diagnose MRSA infection nor to guide or monitor treatment for MRSA infections. Test performance is not FDA approved in patients less than 33 years old. Performed at [facility], [address] [place], [place] [address]          Radiology Studies: No results found.      Scheduled Meds:  ALPRAZolam  1 mg Oral TID   amitriptyline  75 mg Oral QHS   bacitracin   Topical BID   Chlorhexidine Gluconate Cloth  6 each Topical Q0600   DULoxetine  30 mg Oral Daily   ezetimibe  10 mg Oral Daily   hydrocortisone   Rectal BID   levETIRAcetam  750 mg Oral BID   loratadine  10 mg Oral Daily   methylPREDNISolone (SOLU-MEDROL) injection  60 mg Intravenous Daily   mupirocin ointment  1 application Nasal BID    pantoprazole  40 mg Oral Daily   polyethylene glycol  17 g Oral TID     LOS: 17 days    Time spent: 34 minutes    Barb Merino, MD [facility]  If 7PM-7AM, please contact night-coverage [email or web address] 02/20/2021, 11:37 AM

## 2021-02-20 NOTE — Progress Notes (Signed)
Subjective:  Patient continues to complain of abdominal pain and constipation.  She says she is only passed flatus once.  She has been eating without any difficulty.  She has not experienced nausea or vomiting.  Current Medications:  Current Facility-Administered Medications:    acetaminophen (TYLENOL) tablet 650 mg, 650 mg, Oral, Q6H PRN, 650 mg at 02/19/21 1307 **OR** acetaminophen (TYLENOL) suppository 650 mg, 650 mg, Rectal, Q6H PRN, Emokpae, Ejiroghene E, MD   ALPRAZolam (XANAX) tablet 1 mg, 1 mg, Oral, TID, Emokpae, Ejiroghene E, MD, 1 mg at 02/20/21 0850   amitriptyline (ELAVIL) tablet 75 mg, 75 mg, Oral, QHS, Mahala Menghini, PA-C, 75 mg at 02/19/21 2106   bacitracin ointment, , Topical, BID, Barb Merino, MD   Chlorhexidine Gluconate Cloth 2 % PADS 6 each, 6 each, Topical, Q0600, Wynetta Emery, Clanford L, MD, 6 each at 02/19/21 0540   cyclobenzaprine (FLEXERIL) tablet 10 mg, 10 mg, Oral, TID PRN, Wynetta Emery, Clanford L, MD, 10 mg at 02/20/21 0902   DULoxetine (CYMBALTA) DR capsule 30 mg, 30 mg, Oral, Daily, Johnson, Clanford L, MD, 30 mg at 02/20/21 0850   ezetimibe (ZETIA) tablet 10 mg, 10 mg, Oral, Daily, Johnson, Clanford L, MD, 10 mg at 02/20/21 0850   fentaNYL (SUBLIMAZE) injection 12.5-25 mcg, 12.5-25 mcg, Intravenous, Q2H PRN, Wynetta Emery, Clanford L, MD, 25 mcg at 02/20/21 1101   gabapentin (NEURONTIN) capsule 300 mg, 300 mg, Oral, TID PRN, Wynetta Emery, Clanford L, MD, 300 mg at 02/20/21 0902   hydrocortisone (ANUSOL-HC) 2.5 % rectal cream, , Rectal, BID, Emokpae, Ejiroghene E, MD, Given at 02/20/21 0855   hydrOXYzine (ATARAX/VISTARIL) tablet 25 mg, 25 mg, Oral, TID PRN, Johnson, Clanford L, MD   ipratropium-albuterol (DUONEB) 0.5-2.5 (3) MG/3ML nebulizer solution 3 mL, 3 mL, Nebulization, Q4H PRN, Emokpae, Ejiroghene E, MD   levETIRAcetam (KEPPRA) tablet 750 mg, 750 mg, Oral, BID, Tat, David, MD, 750 mg at 02/20/21 0850   loratadine (CLARITIN) tablet 10 mg, 10 mg, Oral, Daily, Johnson,  Clanford L, MD, 10 mg at 02/20/21 0851   methylPREDNISolone sodium succinate (SOLU-MEDROL) 125 mg/2 mL injection 60 mg, 60 mg, Intravenous, Daily, Tat, David, MD, 60 mg at 02/20/21 0853   mupirocin ointment (BACTROBAN) 2 % 1 application, 1 application, Nasal, BID, Johnson, Clanford L, MD, 1 application at 85/02/77 0853   ondansetron (ZOFRAN) injection 4 mg, 4 mg, Intravenous, Q6H PRN, Tat, David, MD, 4 mg at 02/20/21 0047   pantoprazole (PROTONIX) EC tablet 40 mg, 40 mg, Oral, Daily, Emokpae, Ejiroghene E, MD, 40 mg at 02/20/21 0850   polyethylene glycol (MIRALAX / GLYCOLAX) packet 17 g, 17 g, Oral, TID, Wilver Tignor U, MD   polyvinyl alcohol (LIQUIFILM TEARS) 1.4 % ophthalmic solution 1 drop, 1 drop, Both Eyes, PRN, Johnson, Clanford L, MD, 1 drop at 02/17/21 1448  Objective: Blood pressure 130/83, pulse (!) 102, temperature 97.6 F (36.4 C), temperature source Oral, resp. rate 20, height 5' 5"  (1.651 m), weight 67.3 kg, SpO2 98 %. Patient is sitting at the edge of the bed and appears to be in no acute distress. Abdomen is full but less distended than when I saw her 2 days ago.  Bowel sounds are normal.  Abdomen is soft.  She has mild generalized tenderness.  No organomegaly or masses. No LE edema or clubbing noted.  Labs/studies Results:   CBC Latest Ref Rng & Units 02/19/2021 02/18/2021 02/17/2021  WBC 4.0 - 10.5 K/uL 9.8 8.2 7.5  Hemoglobin 12.0 - 15.0 g/dL 11.8(L) 11.2(L) 11.1(L)  Hematocrit  36.0 - 46.0 % 37.7 36.1 34.9(L)  Platelets 150 - 400 K/uL 337 352 291    CMP Latest Ref Rng & Units 02/19/2021 02/18/2021 02/17/2021  Glucose 70 - 99 mg/dL 102(H) 84 107(H)  BUN 6 - 20 mg/dL 13 11 10   Creatinine 0.44 - 1.00 mg/dL 1.01(H) 0.87 1.01(H)  Sodium 135 - 145 mmol/L 137 137 137  Potassium 3.5 - 5.1 mmol/L 3.7 3.7 3.1(L)  Chloride 98 - 111 mmol/L 104 103 103  CO2 22 - 32 mmol/L 24 25 26   Calcium 8.9 - 10.3 mg/dL 9.6 9.6 9.0  Total Protein 6.5 - 8.1 g/dL - - -  Total Bilirubin 0.3  - 1.2 mg/dL - - -  Alkaline Phos 38 - 126 U/L - - -  AST 15 - 41 U/L - - -  ALT 0 - 44 U/L - - -    Hepatic Function Latest Ref Rng & Units 02/10/2021 02/03/2021 01/23/2021  Total Protein 6.5 - 8.1 g/dL 5.8(L) 7.6 -  Albumin 3.5 - 5.0 g/dL 3.0(L) 3.7 2.3(L)  AST 15 - 41 U/L 28 41 -  ALT 0 - 44 U/L 22 29 -  Alk Phosphatase 38 - 126 U/L 73 121 -  Total Bilirubin 0.3 - 1.2 mg/dL 0.4 0.4 -  Bilirubin, Direct 0.0 - 0.2 mg/dL - - -    Lab Results  Component Value Date   CRP 0.5 02/12/2021     Colonic biopsy reveals acute and chronic inflammation.  No specific features such as granulomas. Discussed path with Dr. Melina Copa of pathology. CMV stains pending.  Assessment:  #1.  Colitis with distal sigmoid colon stricture.  She had repeat sigmoidoscopy by Dr. Abbey Chatters yesterday.  He noted last inflammation and he was able to traverse the stricture with ultraslim scope and able to subsequent dilated to 15 mm.  Biopsy changes consistent with Crohn's disease although pathognomonic features absent.  We will continue therapy. She will definitely need follow-up exam/colonoscopy in 3 months or so.  #2.  Constipation.  Constipation is multifactorial.  She does not appear to be obstructed.  Polyethylene glycol at a low dose is not helping.  We will give her 3 doses per day for 2 days. If bowels start moving she should be able to go home soon.  #3.  Abdominal pain.  She remains on IV fentanyl.  Will discuss with Dr. Tera Helper about switching her to oral agent.  I stressed to the patient and her husband that she should be using pain medication judiciously as it will make her constipation worse and perhaps make her abdominal pain worse as well.  #4.  Mild anemia due to GI blood loss and chronic disease.  H&H has remained stable.  Plan:  Polyethylene glycol 17 g p.o. 3 times daily x 6 doses. Patient encouraged to ambulate on the floor.  Please note biopsy results were not back when I saw the patient.  Biopsy  results will be reviewed with her soon.

## 2021-02-20 NOTE — Progress Notes (Signed)
[facility]   Stopped in to see patient but she is sleeping. Tolerating diet and awaiting BM. Hopefully can have BM and get discharge for outpatient tertiary care follow up.  No charge.  Curlene Labrum, MD [facility] [address] [place], [place] [address] [phone] (office)

## 2021-02-20 NOTE — Progress Notes (Signed)
Biopsy results reviewed with patient and her husband. Will switch patient to oral steroids starting tomorrow morning. Patient will receive second dose of infliximab tomorrow and if her bowels start moving she should be able to go home. Will see patient tomorrow morning.

## 2021-02-21 ENCOUNTER — Inpatient Hospital Stay (HOSPITAL_COMMUNITY): Payer: 59

## 2021-02-21 DIAGNOSIS — K529 Noninfective gastroenteritis and colitis, unspecified: Secondary | ICD-10-CM | POA: Diagnosis not present

## 2021-02-21 NOTE — Progress Notes (Signed)
Subjective:  Patient says she is passing flatus but only small amount.  She has not had a bowel movement in 24 hours.  She is not having any nausea or vomiting.  Her husband states she ate well at lunch and supper.  She did eat her breakfast this morning which consisted of bacon and egg.  She states pain is about the same.  Current Medications:  Current Facility-Administered Medications:    acetaminophen (TYLENOL) tablet 650 mg, 650 mg, Oral, Q6H PRN, 650 mg at 02/19/21 1307 **OR** acetaminophen (TYLENOL) suppository 650 mg, 650 mg, Rectal, Q6H PRN, Emokpae, Ejiroghene E, MD   ALPRAZolam (XANAX) tablet 1 mg, 1 mg, Oral, TID, Emokpae, Ejiroghene E, MD, 1 mg at 02/21/21 0739   amitriptyline (ELAVIL) tablet 75 mg, 75 mg, Oral, QHS, Mahala Menghini, PA-C, 75 mg at 02/20/21 2320   bacitracin ointment, , Topical, BID, Barb Merino, MD   calcium carbonate (TUMS - dosed in mg elemental calcium) chewable tablet 200 mg of elemental calcium, 1 tablet, Oral, TID WC, Ghimire, Kuber, MD, 200 mg of elemental calcium at 02/21/21 1132   Chlorhexidine Gluconate Cloth 2 % PADS 6 each, 6 each, Topical, Q0600, Wynetta Emery, Clanford L, MD, 6 each at 02/19/21 0540   cyclobenzaprine (FLEXERIL) tablet 10 mg, 10 mg, Oral, TID PRN, Wynetta Emery, Clanford L, MD, 10 mg at 02/21/21 1000   DULoxetine (CYMBALTA) DR capsule 30 mg, 30 mg, Oral, Daily, Johnson, Clanford L, MD, 30 mg at 02/21/21 5027   ezetimibe (ZETIA) tablet 10 mg, 10 mg, Oral, Daily, Johnson, Clanford L, MD, 10 mg at 02/21/21 0741   fentaNYL (SUBLIMAZE) injection 12.5-25 mcg, 12.5-25 mcg, Intravenous, Q2H PRN, Wynetta Emery, Clanford L, MD, 12.5 mcg at 02/21/21 1303   gabapentin (NEURONTIN) capsule 300 mg, 300 mg, Oral, TID PRN, Wynetta Emery, Clanford L, MD, 300 mg at 02/21/21 1000   hydrocortisone (ANUSOL-HC) 2.5 % rectal cream, , Rectal, BID, Emokpae, Ejiroghene E, MD, Given at 02/20/21 2323   hydrOXYzine (ATARAX/VISTARIL) tablet 25 mg, 25 mg, Oral, TID PRN, Johnson, Clanford  L, MD   inFLIXimab (REMICADE) 5 mg/kg = 300 mg in sodium chloride 0.9 % 250 mL infusion, 5 mg/kg, Intravenous, Once, Kateleen Encarnacion U, MD, Last Rate: 10 mL/hr at 02/21/21 1134, 300 mg at 02/21/21 1134   ipratropium-albuterol (DUONEB) 0.5-2.5 (3) MG/3ML nebulizer solution 3 mL, 3 mL, Nebulization, Q4H PRN, Emokpae, Ejiroghene E, MD   levETIRAcetam (KEPPRA) tablet 750 mg, 750 mg, Oral, BID, Tat, David, MD, 750 mg at 02/21/21 0741   loratadine (CLARITIN) tablet 10 mg, 10 mg, Oral, Daily, Johnson, Clanford L, MD, 10 mg at 02/21/21 0740   mupirocin ointment (BACTROBAN) 2 % 1 application, 1 application, Nasal, BID, Johnson, Clanford L, MD, 1 application at 74/12/87 2323   ondansetron (ZOFRAN) injection 4 mg, 4 mg, Intravenous, Q6H PRN, Tat, David, MD, 4 mg at 02/21/21 0609   pantoprazole (PROTONIX) EC tablet 40 mg, 40 mg, Oral, Daily, Emokpae, Ejiroghene E, MD, 40 mg at 02/21/21 0741   polyethylene glycol (MIRALAX / GLYCOLAX) packet 17 g, 17 g, Oral, TID, Marvelous Bouwens U, MD, 17 g at 02/21/21 0738   polyvinyl alcohol (LIQUIFILM TEARS) 1.4 % ophthalmic solution 1 drop, 1 drop, Both Eyes, PRN, Johnson, Clanford L, MD, 1 drop at 02/17/21 1448   predniSONE (DELTASONE) tablet 40 mg, 40 mg, Oral, Q breakfast, Maat Kafer U, MD, 40 mg at 02/21/21 0739  Objective: Blood pressure 133/63, pulse 92, temperature (!) 97.4 F (36.3 C), temperature source Oral, resp. rate 18,  height _0  (1.651 m), weight 67.3 kg, SpO2 97 %.  Patient is drowsy but able to carry on a conversation and answer questions.  She says she has been up all night. Abdomen is full.  Bowel sounds are normal.  On palpation is soft.  She remains with mild generalized tenderness.  There is no guarding.  No organomegaly or masses.  Labs/studies Results:   CBC Latest Ref Rng & Units 02/19/2021 02/18/2021 02/17/2021  WBC 4.0 - 10.5 K/uL 9.8 8.2 7.5  Hemoglobin 12.0 - 15.0 g/dL 11.8(L) 11.2(L) 11.1(L)  Hematocrit 36.0 - 46.0 % 37.7 36.1  34.9(L)  Platelets 150 - 400 K/uL 337 352 291    CMP Latest Ref Rng & Units 02/19/2021 02/18/2021 02/17/2021  Glucose 70 - 99 mg/dL 102(H) 84 107(H)  BUN 6 - 20 mg/dL _1 Creatinine 0.44 - 1.00 mg/dL 1.01(H) 0.87 1.01(H)  Sodium 135 - 145 mmol/L 137 137 137  Potassium 3.5 - 5.1 mmol/L 3.7 3.7 3.1(L)  Chloride 98 - 111 mmol/L 104 103 103  CO2 22 - 32 mmol/L _2 Calcium 8.9 - 10.3 mg/dL 9.6 9.6 9.0  Total Protein 6.5 - 8.1 g/dL - - -  Total Bilirubin 0.3 - 1.2 mg/dL - - -  Alkaline Phos 38 - 126 U/L - - -  AST 15 - 41 U/L - - -  ALT 0 - 44 U/L - - -    Hepatic Function Latest Ref Rng & Units 02/10/2021 02/03/2021 01/23/2021  Total Protein 6.5 - 8.1 g/dL 5.8(L) 7.6 -  Albumin 3.5 - 5.0 g/dL 3.0(L) 3.7 2.3(L)  AST 15 - 41 U/L 28 41 -  ALT 0 - 44 U/L 22 29 -  Alk Phosphatase 38 - 126 U/L 73 121 -  Total Bilirubin 0.3 - 1.2 mg/dL 0.4 0.4 -  Bilirubin, Direct 0.0 - 0.2 mg/dL - - -    Lab Results  Component Value Date   CRP 0.5 02/12/2021     Colonic biopsy reveals acute and chronic inflammation.  No specific features such as granulomas. Discussed path with Dr. Melina Copa of pathology. CMV stains pending.  Assessment:  #1.  Colitis with distal sigmoid colon stricture.  She had repeat sigmoidoscopy by Dr. Abbey Chatters yesterday.  He noted last inflammation and he was able to traverse the stricture with ultraslim scope and able to subsequent dilated to 15 mm.  Biopsy changes consistent with Crohn's disease although pathognomonic features absent.  Patient is receiving second dose of infliximab today.  She has been transitioned to prednisone by mouth. Discussed with Dr. [name].  Would like for her to have a bowel movement before she goes home.  Therefore may have to wait another day.  #2.  Constipation.  She still has not had any bowel movement.  Abdominal exam not much change from yesterday.  She remains on polyethylene glycol.  Will obtain acute abdominal series today to assess bowel  dilation   #3.  Mild anemia due to GI blood loss and chronic disease.  H&H has remained stable.  Plan:  Acute abdominal series this afternoon.

## 2021-02-21 NOTE — Progress Notes (Signed)
PROGRESS NOTE    [name]  UXL:[id number] DOB: 1971/03/07 DOA: 02/03/2021 PCP: Teodoro Kil, PA-C   Brief Narrative:  50 year old female with a history of anxiety, asthma, chronic low back pain, collagen vascular disease, copd, chrons, gerd, htn, ibs, dm, and a seizure disorder. She presented with hematochezia, nausea, and vomiting. She was recently admitted 01/16/21-01/24/21 due to sepsis secondary to colitis. She had been discharged on PO antibiotics, did not comply with her antibiotics, and has returned with colitis symptoms. GI has been consulted.  Remains in the hospital with ongoing abdominal pain, unable to have bowel movements and rectal bleeding.  Currently on IV steroids and also received Remicade.  Assessment & Plan:   Principal Problem:   Colitis Active Problems:   TOBACCO ABUSE   Constipation   COPD (chronic obstructive pulmonary disease) (HCC)   Diabetes mellitus without complication ([place])   [id number] virus infection   Diarrhea   Hematochezia   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HCC)   Bloating   Abdominal pain   Crohn's disease with colitis and sigmoid colon stricture:  -10/7 flex sig--Diffuse severe inflammation characterized by erosions, erythema and shallow ulcerations was found in the rectum and in the sigmoid colon extending up to approx 15 cm from the anal verge.  GI consulted.  Started on IV steroids.  Given a dose of Remicade infusion on 10/15 for salvage therapy. Multiple discussions with gastroenterology at [facility] transfer line and at [facility] and they recommended discharging and going back as outpatient. -10/20, flexible sigmoidoscopy done.  Reported some improvement of inflammation and also able to dilate the sigmoid stricture.  Plan is to continue IV steroids and change to oral steroids on discharge, repeat Remicade infusion on 10/22 and refer to [facility] for follow-up. - on home flexeril and elavil to augment pain control. -No meaningful bowel movements  yet.     TOBACCO ABUSE -cessation discussed     COPD (chronic obstructive pulmonary disease) (HCC) -no current exacerbation, on room air -duoneb prn -tiotropium prn     Hematochezia -Likely secondary to the patient's colitis, reports continued sporadic bright red blood with bowel movements and wiping which continues to persist even after IV steroids and remicade infusion Hemoglobin is stable.     Anxiety -on her home dose of Xanax, 37m tid   Seizure disorder -on her home keppra dose and no seizure activity      Diabetes mellitus without complication  -no current medications, no diet restrictions,  -continue eating soft diet, patient having some regular foods brought in -  has been diet controlled     DVT prophylaxis: SCDs Code Status: Full Family Communication: Husband at bedside  Disposition Plan:  Status is: Inpatient   Remains inpatient appropriate because: Ongoing need for IV steroids, still symptomatic.  Electrolyte abnormalities.   Consultants:  GI General surgery   Procedures:  Flex sigmoidoscopy   Antimicrobials:  Ceftriaxone 10/5-10/7 Ciprofloxacin 10/4-/10/5 Metronidazole 10/4-10/7  Subjective: Patient seen and evaluated today.  She was up and having breakfast.  She denies any significant abdominal pain and is due to receive Remicade today.  She denies any bowel movement today.  Objective: Vitals:   02/20/21 1422 02/20/21 2307 02/21/21 0546 02/21/21 1115  BP: 110/64 107/62 130/79 133/63  Pulse: 89 84 (!) 102 92  Resp: 18 18 18 18   Temp: 97.9 F (36.6 C) 97.7 F (36.5 C) (!) 97.5 F (36.4 C) (!) 97.4 F (36.3 C)  TempSrc: Oral  Axillary Oral  SpO2:  94% 96% 98% 97%  Weight:      Height:        Intake/Output Summary (Last 24 hours) at 02/21/2021 1217 Last data filed at 02/21/2021 0900 Gross per 24 hour  Intake 1080 ml  Output --  Net 1080 ml   Filed Weights   02/03/21 1140 02/04/21 1546  Weight: 73.3 kg 67.3 kg     Examination:  General exam: Appears calm and comfortable  Respiratory system: Clear to auscultation. Respiratory effort normal. Cardiovascular system: S1 & S2 heard, RRR.  Gastrointestinal system: Abdomen is soft Central nervous system: Alert and awake Extremities: No edema Skin: No significant lesions noted Psychiatry: Flat affect.    Data Reviewed: I have personally reviewed following labs and imaging studies  CBC: Recent Labs  Lab 02/15/21 0422 02/16/21 0522 02/17/21 0710 02/18/21 0745 02/19/21 0545  WBC 6.5 6.3 7.5 8.2 9.8  HGB 11.4* 11.1* 11.1* 11.2* 11.8*  HCT 35.3* 33.9* 34.9* 36.1 37.7  MCV 95.4 95.5 96.1 97.8 98.2  PLT 398 362 291 352 517   Basic Metabolic Panel: Recent Labs  Lab 02/15/21 0422 02/16/21 0522 02/17/21 0710 02/18/21 0624 02/19/21 0545  NA 138 137 137 137 137  K 3.3* 3.7 3.1* 3.7 3.7  CL 103 104 103 103 104  CO2 27 27 26 25 24   GLUCOSE 89 92 107* 84 102*  BUN 10 12 10 11 13   CREATININE 0.79 0.89 1.01* 0.87 1.01*  CALCIUM 9.2 9.2 9.0 9.6 9.6  MG 2.0  --  2.0 2.1 2.2   GFR: Estimated Creatinine Clearance: 59.3 mL/min (A) (by C-G formula based on SCr of 1.01 mg/dL (H)). Liver Function Tests: No results for input(s): AST, ALT, ALKPHOS, BILITOT, PROT, ALBUMIN in the last 168 hours. No results for input(s): LIPASE, AMYLASE in the last 168 hours. No results for input(s): AMMONIA in the last 168 hours. Coagulation Profile: No results for input(s): INR, PROTIME in the last 168 hours. Cardiac Enzymes: No results for input(s): CKTOTAL, CKMB, CKMBINDEX, TROPONINI in the last 168 hours. BNP (last 3 results) No results for input(s): PROBNP in the last 8760 hours. HbA1C: No results for input(s): HGBA1C in the last 72 hours. CBG: No results for input(s): GLUCAP in the last 168 hours. Lipid Profile: No results for input(s): CHOL, HDL, LDLCALC, TRIG, CHOLHDL, LDLDIRECT in the last 72 hours. Thyroid Function Tests: No results for input(s):  TSH, T4TOTAL, FREET4, T3FREE, THYROIDAB in the last 72 hours. Anemia Panel: No results for input(s): VITAMINB12, FOLATE, FERRITIN, TIBC, IRON, RETICCTPCT in the last 72 hours. Sepsis Labs: No results for input(s): PROCALCITON, LATICACIDVEN in the last 168 hours.  Recent Results (from the past 240 hour(s))  MRSA Next Gen by PCR, Nasal     Status: Abnormal   Collection Time: 02/14/21  2:29 PM   Specimen: Nasal Mucosa; Nasal Swab  Result Value Ref Range Status   MRSA by PCR Next Gen DETECTED (A) NOT DETECTED Final    Comment:  L.HILTON 1234 02/15/21 BY STEPHTR (NOTE) The GeneXpert MRSA Assay (FDA approved for NASAL specimens only), is one component of a comprehensive MRSA colonization surveillance program. It is not intended to diagnose MRSA infection nor to guide or monitor treatment for MRSA infections. Test performance is not FDA approved in patients less than 88 years old. Performed at [facility], [address] [place], [place] [address]          Radiology Studies: No results found.      Scheduled Meds:  ALPRAZolam  1 mg Oral TID   amitriptyline  75 mg Oral QHS   bacitracin   Topical BID   calcium carbonate  1 tablet Oral TID WC   Chlorhexidine Gluconate Cloth  6 each Topical Q0600   DULoxetine  30 mg Oral Daily   ezetimibe  10 mg Oral Daily   hydrocortisone   Rectal BID   levETIRAcetam  750 mg Oral BID   loratadine  10 mg Oral Daily   mupirocin ointment  1 application Nasal BID   pantoprazole  40 mg Oral Daily   polyethylene glycol  17 g Oral TID   predniSONE  40 mg Oral Q breakfast   Continuous Infusions:  inFLIXimab 300 mg (02/21/21 1134)     LOS: 18 days    Time spent: 35 minutes    Hill Mackie Darleen Crocker, DO [facility]  If 7PM-7AM, please contact night-coverage [email or web address] 02/21/2021, 12:17 PM

## 2021-02-22 DIAGNOSIS — K529 Noninfective gastroenteritis and colitis, unspecified: Secondary | ICD-10-CM | POA: Diagnosis not present

## 2021-02-22 LAB — CBC
HCT: 35.2 % — ABNORMAL LOW (ref 36.0–46.0)
Hemoglobin: 10.9 g/dL — ABNORMAL LOW (ref 12.0–15.0)
MCH: 30.6 pg (ref 26.0–34.0)
MCHC: 31 g/dL (ref 30.0–36.0)
MCV: 98.9 fL (ref 80.0–100.0)
Platelets: 293 10*3/uL (ref 150–400)
RBC: 3.56 MIL/uL — ABNORMAL LOW (ref 3.87–5.11)
RDW: 14.3 % (ref 11.5–15.5)
WBC: 9.4 10*3/uL (ref 4.0–10.5)
nRBC: 0 % (ref 0.0–0.2)

## 2021-02-22 LAB — BASIC METABOLIC PANEL
Anion gap: 6 (ref 5–15)
BUN: 13 mg/dL (ref 6–20)
CO2: 30 mmol/L (ref 22–32)
Calcium: 9.3 mg/dL (ref 8.9–10.3)
Chloride: 102 mmol/L (ref 98–111)
Creatinine, Ser: 0.92 mg/dL (ref 0.44–1.00)
GFR, Estimated: >60 mL/min (ref >60)
Glucose, Bld: 118 mg/dL — ABNORMAL HIGH (ref 70–99)
Potassium: 3.2 mmol/L — ABNORMAL LOW (ref 3.5–5.1)
Sodium: 138 mmol/L (ref 135–145)

## 2021-02-22 LAB — MAGNESIUM: Magnesium: 2 mg/dL (ref 1.7–2.4)

## 2021-02-22 MED ORDER — POTASSIUM CHLORIDE CRYS ER 20 MEQ PO TBCR
40.0000 meq | EXTENDED_RELEASE_TABLET | Freq: Once | ORAL | Status: AC
  Administered 2021-02-22: 40 meq via ORAL
  Filled 2021-02-22: qty 4

## 2021-02-22 MED ORDER — TRAMADOL HCL 50 MG PO TABS: 50.0000 mg | ORAL_TABLET | Freq: Four times a day (QID) | ORAL | 0 refills | Status: DC | PRN

## 2021-02-22 MED ORDER — PANTOPRAZOLE SODIUM 40 MG PO TBEC: 40.0000 mg | DELAYED_RELEASE_TABLET | Freq: Every day | ORAL | 2 refills | Status: DC

## 2021-02-22 MED ORDER — PREDNISONE 10 MG PO TABS: ORAL_TABLET | ORAL | 0 refills | Status: AC

## 2021-02-22 MED ORDER — CALCIUM CARBONATE ANTACID 500 MG PO CHEW: 1.0000 | CHEWABLE_TABLET | Freq: Three times a day (TID) | ORAL | 1 refills | Status: AC

## 2021-02-22 NOTE — Discharge Summary (Addendum)
Physician Discharge Summary  Anjelina Dung IRW:[id number] DOB: 1971-02-21 DOA: 02/03/2021  PCP: Teodoro Kil, PA-C  Admit date: 02/03/2021  Discharge date: 02/22/2021  Admitted From:Home  Disposition:  Home  Recommendations for Outpatient Follow-up:  Follow up with PCP in 1-2 weeks Follow-up with gastroenterology in 2 weeks Repeat Remicade infusion in 4 weeks Continue prolonged prednisone taper as prescribed and recommended by GI Continue MiraLAX daily Continue other home medications as noted  Home Health: None  Equipment/Devices: None  Discharge Condition:Stable  CODE STATUS: Full  Diet recommendation: Heart Healthy  Brief/Interim Summary: 50 year old female with a history of anxiety, asthma, chronic low back pain, collagen vascular disease, copd, Crohn's, gerd, htn, ibs, dm, and a seizure disorder. She presented with hematochezia, nausea, and vomiting. She was recently admitted 01/16/21-01/24/21 due to sepsis secondary to colitis. She had been discharged on PO antibiotics, did not comply with her antibiotics, and had returned with colitis symptoms.  She was admitted for Crohn's disease flare and sigmoid colon stricture.  She had undergone flex sigmoidoscopy on 10/7 and was started on IV steroids and Remicade.  Transfer was attempted to tertiary care facility, but this could not be arranged.  She had repeat flexible sigmoidoscopy on 10/20 with dilation of the area and continued on Remicade infusion as well as IV steroids during this prolonged course of admission.  She continues to have significant pain and denies bowel movements, but she does not have any significant stool on abdominal x-ray.  She continues to demand pain medications, but after further discussion with GI it appears appropriate for her to be discharged on oral steroids as well as oral pain medications as noted above with close follow-up in 2 weeks.  She is otherwise in stable condition with no other acute events noted  during the course of this admission.  Discharge Diagnoses:  Principal Problem:   Colitis Active Problems:   TOBACCO ABUSE   Constipation   COPD (chronic obstructive pulmonary disease) (HCC)   Diabetes mellitus without complication ([place])   PYPPJ-[id number] virus infection   Diarrhea   Hematochezia   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HCC)   Bloating   Abdominal pain  Principal discharge diagnosis: Crohn's disease with colitis and sigmoid colon stricture.  Discharge Instructions  Discharge Instructions     Diet - low sodium heart healthy   Complete by: As directed    Increase activity slowly   Complete by: As directed    No wound care   Complete by: As directed       Allergies as of 02/22/2021       Reactions   Codeine Shortness Of Breath, Swelling, Rash   Throat swelling   Dilaudid [hydromorphone Hcl] Shortness Of Breath, Swelling   Hydrocodone Shortness Of Breath, Swelling, Rash   Morphine And Related Shortness Of Breath, Swelling, Rash   Oxycodone Shortness Of Breath, Swelling, Rash   Patient states ALL pain medications make her deathly sick.   Penicillins Shortness Of Breath, Swelling, Other (See Comments)   Has patient had a PCN reaction causing immediate rash, facial/tongue/throat swelling, SOB or lightheadedness with hypotension: Yes Has patient had a PCN reaction causing severe rash involving mucus membranes or skin necrosis: Yes Has patient had a PCN reaction that required hospitalization Yes Has patient had a PCN reaction occurring within the last 10 years: No If all of the above answers are "NO", then may proceed with Cephalosporin use. Throat swells 02/06/21--TOLERATES CEFTRIAXONE    Acetaminophen    Per MD patient  states she can't take Tylenol because of liver enzymes   Ativan [lorazepam] Other (See Comments)   Migraines.   Strawberry Extract Swelling   Watermelon Concentrate Swelling   Benadryl [diphenhydramine Hcl] Palpitations   Latex Rash        Medication  List     STOP taking these medications    diphenoxylate-atropine 2.5-0.025 MG tablet Commonly known as: Lomotil       TAKE these medications    acetaminophen 325 MG tablet Commonly known as: TYLENOL Take 2 tablets (650 mg total) by mouth every 6 (six) hours as needed for mild pain or fever (or Fever >/= 101).   albuterol 108 (90 Base) MCG/ACT inhaler Commonly known as: VENTOLIN HFA Inhale 2 puffs into the lungs every 4 (four) hours as needed for wheezing or shortness of breath.   ALPRAZolam 1 MG tablet Commonly known as: XANAX Take 1 mg by mouth 3 (three) times daily as needed for anxiety.   amitriptyline 75 MG tablet Commonly known as: ELAVIL Take 75 mg by mouth at bedtime.   calcium carbonate 500 MG chewable tablet Commonly known as: TUMS - dosed in mg elemental calcium Chew 1 tablet (200 mg of elemental calcium total) by mouth 3 (three) times daily with meals.   cetirizine 10 MG tablet Commonly known as: ZYRTEC Take 10 mg by mouth daily.   cyclobenzaprine 10 MG tablet Commonly known as: FLEXERIL Take 10 mg by mouth 3 (three) times daily as needed for muscle spasms.   DULoxetine 30 MG capsule Commonly known as: CYMBALTA Take 30 mg by mouth daily.   ezetimibe 10 MG tablet Commonly known as: ZETIA Take 10 mg by mouth daily.   folic acid 1 MG tablet Commonly known as: FOLVITE Take 1 tablet (1 mg total) by mouth daily.   gabapentin 300 MG capsule Commonly known as: NEURONTIN Take 300 mg by mouth 3 (three) times daily.   hydrOXYzine 25 MG tablet Commonly known as: ATARAX/VISTARIL Take 1 tablet by mouth at bedtime.   levETIRAcetam 750 MG tablet Commonly known as: KEPPRA Take 750 mg by mouth 2 (two) times daily.   nitroGLYCERIN 0.4 MG SL tablet Commonly known as: NITROSTAT Place 1 tablet (0.4 mg total) under the tongue every 5 (five) minutes as needed for chest pain.   omeprazole 40 MG capsule Commonly known as: PRILOSEC Take 40 mg by mouth daily.    ondansetron 8 MG disintegrating tablet Commonly known as: Zofran ODT Take 1 tablet (8 mg total) every 8 (eight) hours as needed by mouth for nausea or vomiting.   pantoprazole 40 MG tablet Commonly known as: PROTONIX Take 1 tablet (40 mg total) by mouth daily.   predniSONE 10 MG tablet Commonly known as: DELTASONE Take 4 tablets (40 mg total) by mouth daily with breakfast for 6 days, THEN 3.5 tablets (35 mg total) daily with breakfast for 7 days, THEN 3 tablets (30 mg total) daily with breakfast for 7 days, THEN 2.5 tablets (25 mg total) daily with breakfast for 7 days, THEN 2 tablets (20 mg total) daily with breakfast for 7 days, THEN 1.5 tablets (15 mg total) daily with breakfast for 7 days, THEN 1 tablet (10 mg total) daily with breakfast for 7 days, THEN 0.5 tablets (5 mg total) daily with breakfast for 7 days. Start taking on: February 22, 2021   promethazine 25 MG tablet Commonly known as: PHENERGAN Take 1 tablet (25 mg total) by mouth every 6 (six) hours as needed for nausea or  vomiting.   Restasis 0.05 % ophthalmic emulsion Generic drug: cycloSPORINE Place 1 drop into both eyes 2 (two) times daily.   Spiriva Respimat 2.5 MCG/ACT Aers Generic drug: Tiotropium Bromide Monohydrate Inhale 1 puff into the lungs daily as needed (shortness of breath).   thiamine 100 MG tablet Take 1 tablet (100 mg total) by mouth daily.   topiramate 25 MG tablet Commonly known as: TOPAMAX Take 1 tablet by mouth in the morning and at bedtime.   traMADol 50 MG tablet Commonly known as: Ultram Take 1 tablet (50 mg total) by mouth every 6 (six) hours as needed for moderate pain or severe pain.        Follow-up Information     Teodoro Kil, [place]. Schedule an appointment as soon as possible for a visit in 1 week(s).   Specialty: Physician Assistant Contact information: [address] [place] [address] [phone]         [place]. Go  in 2 week(s).   Contact information: [place] [address] [phone]               Allergies  Allergen Reactions   Codeine Shortness Of Breath, Swelling and Rash    Throat swelling   Dilaudid [Hydromorphone Hcl] Shortness Of Breath and Swelling   Hydrocodone Shortness Of Breath, Swelling and Rash   Morphine And Related Shortness Of Breath, Swelling and Rash   Oxycodone Shortness Of Breath, Swelling and Rash    Patient states ALL pain medications make her deathly sick.   Penicillins Shortness Of Breath, Swelling and Other (See Comments)    Has patient had a PCN reaction causing immediate rash, facial/tongue/throat swelling, SOB or lightheadedness with hypotension: Yes Has patient had a PCN reaction causing severe rash involving mucus membranes or skin necrosis: Yes Has patient had a PCN reaction that required hospitalization Yes Has patient had a PCN reaction occurring within the last 10 years: No If all of the above answers are "NO", then may proceed with Cephalosporin use.   Throat swells  02/06/21--TOLERATES CEFTRIAXONE     Acetaminophen     Per MD patient states she can't take Tylenol because of liver enzymes   Ativan [Lorazepam] Other (See Comments)    Migraines.   Strawberry Extract Swelling   Watermelon Concentrate Swelling   Benadryl [Diphenhydramine Hcl] Palpitations   Latex Rash    Consultations: GI General surgery   Procedures/Studies: DG Abd 1 View  Result Date: 02/16/2021 CLINICAL DATA:  Abdominal pain, history of Crohn's EXAM: ABDOMEN - 1 VIEW COMPARISON:  February 14, 2021, February 03, 2021 FINDINGS: Air and stool filled nondilated loops of bowel. Status post cholecystectomy. Visualized lung bases are unremarkable. IMPRESSION: Nonobstructive bowel gas pattern. Electronically Signed   By: Valentino Saxon M.D.   On: 02/16/2021 15:24   DG Abd 1 View  Result Date: 02/14/2021 CLINICAL DATA:  Abdominal distension and rectal  bleeding. History of Crohn's disease. EXAM: ABDOMEN - 1 VIEW COMPARISON:  CT abdomen dated 02/03/2021. FINDINGS: No dilated large or small bowel loops are identified. No evidence of free intraperitoneal air is seen. No evidence of renal or ureteral calculi. Cholecystectomy clips in the RIGHT upper quadrant. Visualized osseous structures are unremarkable. IMPRESSION: Nonobstructive bowel gas pattern. Electronically Signed   By: Franki Cabot M.D.   On: 02/14/2021 08:03   CT HEAD WO CONTRAST (5MM)  Result Date: 02/16/2021 CLINICAL DATA:  Head trauma, moderate to severe. Right-sided head numbness over the last  2 days. Fell 1 month ago. EXAM: CT HEAD WITHOUT CONTRAST TECHNIQUE: Contiguous axial images were obtained from the base of the skull through the vertex without intravenous contrast. COMPARISON:  01/16/2021 FINDINGS: Brain: The brain shows a normal appearance without evidence of malformation, atrophy, old or acute small or large vessel infarction, mass lesion, hemorrhage, hydrocephalus or extra-axial collection. Vascular: No hyperdense vessel. No evidence of atherosclerotic calcification. Skull: Normal.  No traumatic finding.  No focal bone lesion. Sinuses/Orbits: Sinuses are clear. Orbits appear normal. Mastoids are clear. Other: None significant IMPRESSION: Normal head CT. Electronically Signed   By: Nelson Chimes M.D.   On: 02/16/2021 12:50   CT ABDOMEN PELVIS W CONTRAST  Result Date: 02/03/2021 CLINICAL DATA:  Abdominal abscess/infection suspected. Rectal bleeding for 7 days. Denies pain. History of COPD, HTN, DM, hyster, GB, oophorectomy EXAM: CT ABDOMEN AND PELVIS WITH CONTRAST TECHNIQUE: Multidetector CT imaging of the abdomen and pelvis was performed using the standard protocol following bolus administration of intravenous contrast. CONTRAST:  158m OMNIPAQUE IOHEXOL 300 MG/ML  SOLN COMPARISON:  CT abdomen pelvis 01/21/2021 FINDINGS: Lower chest: Partially visualized slightly improved trace  pericardial effusion. Resolution of bilateral pleural effusions. No acute abnormality. Hepatobiliary: Focally hypodensity of the hepatic parenchyma along the falciform ligament likely represents focal fatty infiltration. No focal liver abnormality. Status post cholecystectomy. No biliary dilatation. Pancreas: No focal lesion. Normal pancreatic contour. No surrounding inflammatory changes. No main pancreatic ductal dilatation. Spleen: Normal in size without focal abnormality. Adrenals/Urinary Tract: No adrenal nodule bilaterally. Bilateral kidneys enhance symmetrically. No hydronephrosis. No hydroureter. The urinary bladder is unremarkable. Resolution of gas associated with the urinary bladder. On delayed imaging, there is no urothelial wall thickening and there are no filling defects in the opacified portions of the bilateral collecting systems or ureters. Stomach/Bowel: Stomach is within normal limits. No evidence of small bowel wall thickening or dilatation. Slightly improved but persistent bowel wall thickening, submucosal hyperemia, pericolonic fat stranding of the descending colon and rectosigmoid colon. Appendix appears normal. Vascular/Lymphatic: No abdominal aorta or iliac aneurysm. Mild noncalcified atherosclerotic plaque of the aorta and its branches. No abdominal, pelvic, or inguinal lymphadenopathy. Reproductive: Status post hysterectomy. No adnexal masses. Other: No intraperitoneal free fluid. No intraperitoneal free gas. No organized fluid collection. Musculoskeletal: No abdominal wall hernia or abnormality. No suspicious lytic or blastic osseous lesions. No acute displaced fracture. Multilevel degenerative changes of the spine. IMPRESSION: 1. Slightly improved but persistent distal colitis. 2. Partially visualized slightly improved trace pericardial effusion. Resolution of bilateral pleural effusions. 3.  Aortic Atherosclerosis (ICD10-I70.0). Electronically Signed   By: MIven FinnM.D.   On:  02/03/2021 15:12   DG ABD ACUTE 2+V W 1V CHEST  Result Date: 02/21/2021 CLINICAL DATA:  50year old female with history of Crohn's disease and constipation. EXAM: DG ABDOMEN ACUTE WITH 1 VIEW CHEST COMPARISON:  02/16/2021 FINDINGS: There is no evidence of dilated bowel loops or free intraperitoneal air. Cholecystectomy clips in the right upper quadrant. No radiopaque calculi or other significant radiographic abnormality is seen. Heart size and mediastinal contours are within normal limits. Both lungs are clear. IMPRESSION: Nonobstructive bowel gas pattern.  No acute cardiopulmonary disease. Electronically Signed   By: DRuthann CancerM.D.   On: 02/21/2021 14:21     Discharge Exam: Vitals:   02/21/21 2059 02/22/21 0551  BP: 102/60 124/66  Pulse: 94 83  Resp: 20 20  Temp: 97.9 F (36.6 C) 97.7 F (36.5 C)  SpO2: 98% 99%   Vitals:   02/21/21  1115 02/21/21 1350 02/21/21 2059 02/22/21 0551  BP: 133/63 117/76 102/60 124/66  Pulse: 92 (!) 109 94 83  Resp: 18 20 20 20   Temp: (!) 97.4 F (36.3 C) 98.6 F (37 C) 97.9 F (36.6 C) 97.7 F (36.5 C)  TempSrc: Oral  Oral Oral  SpO2: 97% 97% 98% 99%  Weight:      Height:        General: Pt is alert, awake, not in acute distress Cardiovascular: RRR, S1/S2 +, no rubs, no gallops Respiratory: CTA bilaterally, no wheezing, no rhonchi Abdominal: Soft, NT, ND, bowel sounds + Extremities: no edema, no cyanosis    The results of significant diagnostics from this hospitalization (including imaging, microbiology, ancillary and laboratory) are listed below for reference.     Microbiology: Recent Results (from the past 240 hour(s))  MRSA Next Gen by PCR, Nasal     Status: Abnormal   Collection Time: 02/14/21  2:29 PM   Specimen: Nasal Mucosa; Nasal Swab  Result Value Ref Range Status   MRSA by PCR Next Gen DETECTED (A) NOT DETECTED Final    Comment:  L.HILTON 1234 02/15/21 BY STEPHTR (NOTE) The GeneXpert MRSA Assay (FDA approved for NASAL  specimens only), is one component of a comprehensive MRSA colonization surveillance program. It is not intended to diagnose MRSA infection nor to guide or monitor treatment for MRSA infections. Test performance is not FDA approved in patients less than 38 years old. Performed at [facility], [address], [place], [place] [address]      Labs: BNP (last 3 results) No results for input(s): BNP in the last 8760 hours. Basic Metabolic Panel: Recent Labs  Lab 02/16/21 0522 02/17/21 0710 02/18/21 0624 02/19/21 0545 02/22/21 0509  NA 137 137 137 137 138  K 3.7 3.1* 3.7 3.7 3.2*  CL 104 103 103 104 102  CO2 27 26 25 24 30   GLUCOSE 92 107* 84 102* 118*  BUN 12 10 11 13 13   CREATININE 0.89 1.01* 0.87 1.01* 0.92  CALCIUM 9.2 9.0 9.6 9.6 9.3  MG  --  2.0 2.1 2.2 2.0   Liver Function Tests: No results for input(s): AST, ALT, ALKPHOS, BILITOT, PROT, ALBUMIN in the last 168 hours. No results for input(s): LIPASE, AMYLASE in the last 168 hours. No results for input(s): AMMONIA in the last 168 hours. CBC: Recent Labs  Lab 02/16/21 0522 02/17/21 0710 02/18/21 0745 02/19/21 0545 02/22/21 0509  WBC 6.3 7.5 8.2 9.8 9.4  HGB 11.1* 11.1* 11.2* 11.8* 10.9*  HCT 33.9* 34.9* 36.1 37.7 35.2*  MCV 95.5 96.1 97.8 98.2 98.9  PLT 362 291 352 337 293   Cardiac Enzymes: No results for input(s): CKTOTAL, CKMB, CKMBINDEX, TROPONINI in the last 168 hours. BNP: Invalid input(s): POCBNP CBG: No results for input(s): GLUCAP in the last 168 hours. D-Dimer No results for input(s): DDIMER in the last 72 hours. Hgb A1c No results for input(s): HGBA1C in the last 72 hours. Lipid Profile No results for input(s): CHOL, HDL, LDLCALC, TRIG, CHOLHDL, LDLDIRECT in the last 72 hours. Thyroid function studies No results for input(s): TSH, T4TOTAL, T3FREE, THYROIDAB in the last 72 hours.  Invalid input(s): FREET3 Anemia work up No results for input(s): VITAMINB12, FOLATE, FERRITIN, TIBC, IRON,  RETICCTPCT in the last 72 hours. Urinalysis    Component Value Date/Time   COLORURINE YELLOW 02/04/2021 0956   APPEARANCEUR CLEAR 02/04/2021 0956   LABSPEC 1.005 02/04/2021 0956   PHURINE 6.0 02/04/2021 0956   GLUCOSEU NEGATIVE  02/04/2021 0956   GLUCOSEU NEG mg/dL 12/24/2009 2317   HGBUR MODERATE (A) 02/04/2021 0956   BILIRUBINUR NEGATIVE 02/04/2021 0956   KETONESUR NEGATIVE 02/04/2021 0956   PROTEINUR 30 (A) 02/04/2021 0956   UROBILINOGEN 0.2 12/26/2013 1530   NITRITE NEGATIVE 02/04/2021 0956   LEUKOCYTESUR MODERATE (A) 02/04/2021 0956   Sepsis Labs Invalid input(s): PROCALCITONIN,  WBC,  LACTICIDVEN Microbiology Recent Results (from the past 240 hour(s))  MRSA Next Gen by PCR, Nasal     Status: Abnormal   Collection Time: 02/14/21  2:29 PM   Specimen: Nasal Mucosa; Nasal Swab  Result Value Ref Range Status   MRSA by PCR Next Gen DETECTED (A) NOT DETECTED Final    Comment:  L.HILTON 1234 02/15/21 BY STEPHTR (NOTE) The GeneXpert MRSA Assay (FDA approved for NASAL specimens only), is one component of a comprehensive MRSA colonization surveillance program. It is not intended to diagnose MRSA infection nor to guide or monitor treatment for MRSA infections. Test performance is not FDA approved in patients less than 4 years old. Performed at [facility], [address] [place], [place] [address]      Time coordinating discharge: 35 minutes  SIGNED:   Rodena Goldmann, DO [facility] 02/22/2021, 10:57 AM  If 7PM-7AM, please contact night-coverage [email or web address]

## 2021-02-22 NOTE — Progress Notes (Signed)
Nsg Discharge Note  Admit Date:  02/03/2021 Discharge date: 02/22/2021   [name] to be D/C'd Home per MD order.  AVS completed.  Copy for chart, and copy for patient signed, and dated. Patient/caregiver able to verbalize understanding. IV removed, discharge paper work given and reviewed with patient, no questions or concerns, patient stable upon discharge.   Discharge Medication: Allergies as of 02/22/2021       Reactions   Codeine Shortness Of Breath, Swelling, Rash   Throat swelling   Dilaudid [hydromorphone Hcl] Shortness Of Breath, Swelling   Hydrocodone Shortness Of Breath, Swelling, Rash   Morphine And Related Shortness Of Breath, Swelling, Rash   Oxycodone Shortness Of Breath, Swelling, Rash   Patient states ALL pain medications make her deathly sick.   Penicillins Shortness Of Breath, Swelling, Other (See Comments)   Has patient had a PCN reaction causing immediate rash, facial/tongue/throat swelling, SOB or lightheadedness with hypotension: Yes Has patient had a PCN reaction causing severe rash involving mucus membranes or skin necrosis: Yes Has patient had a PCN reaction that required hospitalization Yes Has patient had a PCN reaction occurring within the last 10 years: No If all of the above answers are "NO", then may proceed with Cephalosporin use. Throat swells 02/06/21--TOLERATES CEFTRIAXONE    Acetaminophen    Per MD patient states she can't take Tylenol because of liver enzymes   Ativan [lorazepam] Other (See Comments)   Migraines.   Strawberry Extract Swelling   Watermelon Concentrate Swelling   Benadryl [diphenhydramine Hcl] Palpitations   Latex Rash        Medication List     STOP taking these medications    diphenoxylate-atropine 2.5-0.025 MG tablet Commonly known as: Lomotil       TAKE these medications    acetaminophen 325 MG tablet Commonly known as: TYLENOL Take 2 tablets (650 mg total) by mouth every 6 (six) hours as needed for mild  pain or fever (or Fever >/= 101).   albuterol 108 (90 Base) MCG/ACT inhaler Commonly known as: VENTOLIN HFA Inhale 2 puffs into the lungs every 4 (four) hours as needed for wheezing or shortness of breath.   ALPRAZolam 1 MG tablet Commonly known as: XANAX Take 1 mg by mouth 3 (three) times daily as needed for anxiety.   amitriptyline 75 MG tablet Commonly known as: ELAVIL Take 75 mg by mouth at bedtime.   calcium carbonate 500 MG chewable tablet Commonly known as: TUMS - dosed in mg elemental calcium Chew 1 tablet (200 mg of elemental calcium total) by mouth 3 (three) times daily with meals.   cetirizine 10 MG tablet Commonly known as: ZYRTEC Take 10 mg by mouth daily.   cyclobenzaprine 10 MG tablet Commonly known as: FLEXERIL Take 10 mg by mouth 3 (three) times daily as needed for muscle spasms.   DULoxetine 30 MG capsule Commonly known as: CYMBALTA Take 30 mg by mouth daily.   ezetimibe 10 MG tablet Commonly known as: ZETIA Take 10 mg by mouth daily.   folic acid 1 MG tablet Commonly known as: FOLVITE Take 1 tablet (1 mg total) by mouth daily.   gabapentin 300 MG capsule Commonly known as: NEURONTIN Take 300 mg by mouth 3 (three) times daily.   hydrOXYzine 25 MG tablet Commonly known as: ATARAX/VISTARIL Take 1 tablet by mouth at bedtime.   levETIRAcetam 750 MG tablet Commonly known as: KEPPRA Take 750 mg by mouth 2 (two) times daily.   nitroGLYCERIN 0.4 MG SL tablet Commonly  known as: NITROSTAT Place 1 tablet (0.4 mg total) under the tongue every 5 (five) minutes as needed for chest pain.   omeprazole 40 MG capsule Commonly known as: PRILOSEC Take 40 mg by mouth daily.   ondansetron 8 MG disintegrating tablet Commonly known as: Zofran ODT Take 1 tablet (8 mg total) every 8 (eight) hours as needed by mouth for nausea or vomiting.   pantoprazole 40 MG tablet Commonly known as: PROTONIX Take 1 tablet (40 mg total) by mouth daily.   predniSONE 10 MG  tablet Commonly known as: DELTASONE Take 4 tablets (40 mg total) by mouth daily with breakfast for 6 days, THEN 3.5 tablets (35 mg total) daily with breakfast for 7 days, THEN 3 tablets (30 mg total) daily with breakfast for 7 days, THEN 2.5 tablets (25 mg total) daily with breakfast for 7 days, THEN 2 tablets (20 mg total) daily with breakfast for 7 days, THEN 1.5 tablets (15 mg total) daily with breakfast for 7 days, THEN 1 tablet (10 mg total) daily with breakfast for 7 days, THEN 0.5 tablets (5 mg total) daily with breakfast for 7 days. Start taking on: February 22, 2021   promethazine 25 MG tablet Commonly known as: PHENERGAN Take 1 tablet (25 mg total) by mouth every 6 (six) hours as needed for nausea or vomiting.   Restasis 0.05 % ophthalmic emulsion Generic drug: cycloSPORINE Place 1 drop into both eyes 2 (two) times daily.   Spiriva Respimat 2.5 MCG/ACT Aers Generic drug: Tiotropium Bromide Monohydrate Inhale 1 puff into the lungs daily as needed (shortness of breath).   thiamine 100 MG tablet Take 1 tablet (100 mg total) by mouth daily.   topiramate 25 MG tablet Commonly known as: TOPAMAX Take 1 tablet by mouth in the morning and at bedtime.               Durable Medical Equipment  (From admission, onward)           Start     Ordered   02/22/21 1156  For home use only DME Walker rolling  Once       Question Answer Comment  Walker: With 5 Inch Wheels   Patient needs a walker to treat with the following condition Weakness      02/22/21 1156            Discharge Assessment: Vitals:   02/21/21 2059 02/22/21 0551  BP: 102/60 124/66  Pulse: 94 83  Resp: 20 20  Temp: 97.9 F (36.6 C) 97.7 F (36.5 C)  SpO2: 98% 99%   Skin clean, dry and intact without evidence of skin break down, no evidence of skin tears noted. IV catheter discontinued intact. Site without signs and symptoms of complications - no redness or edema noted at insertion site, patient  denies c/o pain - only slight tenderness at site.  Dressing with slight pressure applied.  D/c Instructions-Education: Discharge instructions given to patient/family with verbalized understanding. D/c education completed with patient/family including follow up instructions, medication list, d/c activities limitations if indicated, with other d/c instructions as indicated by MD - patient able to verbalize understanding, all questions fully answered. Patient instructed to return to ED, call 911, or call MD for any changes in condition.  Patient escorted via [place], and D/C home via private auto.  Zenaida Deed, RN 02/22/2021 12:19 PM

## 2021-02-22 NOTE — Progress Notes (Signed)
Subjective:  Patient says she has not had a stool.  Her husband says she is eating well.  She had most of her breakfast today.  No nausea vomiting reported.  Patient wants another dose of fentanyl before she goes home.  Current Medications:  Current Facility-Administered Medications:    acetaminophen (TYLENOL) tablet 650 mg, 650 mg, Oral, Q6H PRN, 650 mg at 02/19/21 1307 **OR** acetaminophen (TYLENOL) suppository 650 mg, 650 mg, Rectal, Q6H PRN, Emokpae, Ejiroghene E, MD   ALPRAZolam (XANAX) tablet 1 mg, 1 mg, Oral, TID, Emokpae, Ejiroghene E, MD, 1 mg at 02/22/21 0919   amitriptyline (ELAVIL) tablet 75 mg, 75 mg, Oral, QHS, Mahala Menghini, PA-C, 75 mg at 02/21/21 2124   bacitracin ointment, , Topical, BID, Barb Merino, MD, Given at 02/22/21 [phone]   calcium carbonate (TUMS - dosed in mg elemental calcium) chewable tablet 200 mg of elemental calcium, 1 tablet, Oral, TID WC, Ghimire, Dante Gang, MD, 200 mg of elemental calcium at 02/22/21 0916   Chlorhexidine Gluconate Cloth 2 % PADS 6 each, 6 each, Topical, Q0600, Wynetta Emery, Clanford L, MD, 6 each at 02/19/21 0540   cyclobenzaprine (FLEXERIL) tablet 10 mg, 10 mg, Oral, TID PRN, Wynetta Emery, Clanford L, MD, 10 mg at 02/22/21 0433   DULoxetine (CYMBALTA) DR capsule 30 mg, 30 mg, Oral, Daily, Johnson, Clanford L, MD, 30 mg at 02/22/21 0920   ezetimibe (ZETIA) tablet 10 mg, 10 mg, Oral, Daily, Johnson, Clanford L, MD, 10 mg at 02/22/21 9311   gabapentin (NEURONTIN) capsule 300 mg, 300 mg, Oral, TID PRN, Wynetta Emery, Clanford L, MD, 300 mg at 02/22/21 0433   hydrocortisone (ANUSOL-HC) 2.5 % rectal cream, , Rectal, BID, Emokpae, Ejiroghene E, MD, 1 application at 21/62/44 2126   hydrOXYzine (ATARAX/VISTARIL) tablet 25 mg, 25 mg, Oral, TID PRN, Johnson, Clanford L, MD   ipratropium-albuterol (DUONEB) 0.5-2.5 (3) MG/3ML nebulizer solution 3 mL, 3 mL, Nebulization, Q4H PRN, Emokpae, Ejiroghene E, MD   levETIRAcetam (KEPPRA) tablet 750 mg, 750 mg, Oral, BID, Tat,  David, MD, 750 mg at 02/22/21 0916   loratadine (CLARITIN) tablet 10 mg, 10 mg, Oral, Daily, Johnson, Clanford L, MD, 10 mg at 02/22/21 0919   ondansetron (ZOFRAN) injection 4 mg, 4 mg, Intravenous, Q6H PRN, Tat, David, MD, 4 mg at 02/21/21 0609   pantoprazole (PROTONIX) EC tablet 40 mg, 40 mg, Oral, Daily, Emokpae, Ejiroghene E, MD, 40 mg at 02/22/21 6950   polyvinyl alcohol (LIQUIFILM TEARS) 1.4 % ophthalmic solution 1 drop, 1 drop, Both Eyes, PRN, Johnson, Clanford L, MD, 1 drop at 02/17/21 1448   predniSONE (DELTASONE) tablet 40 mg, 40 mg, Oral, Q breakfast, Lauriel Helin U, MD, 40 mg at 02/22/21 7225  Objective: Blood pressure 124/66, pulse 83, temperature 97.7 F (36.5 C), temperature source Oral, resp. rate 20, height _0  (1.651 m), weight 67.3 kg, SpO2 99 %. Patient is standing in her room and appears to be comfortable. She is mildly drowsy but able to carry on conversation.  Labs/studies Results:   CBC Latest Ref Rng & Units 02/22/2021 02/19/2021 02/18/2021  WBC 4.0 - 10.5 K/uL 9.4 9.8 8.2  Hemoglobin 12.0 - 15.0 g/dL 10.9(L) 11.8(L) 11.2(L)  Hematocrit 36.0 - 46.0 % 35.2(L) 37.7 36.1  Platelets 150 - 400 K/uL 293 337 352    CMP Latest Ref Rng & Units 02/22/2021 02/19/2021 02/18/2021  Glucose 70 - 99 mg/dL 118(H) 102(H) 84  BUN 6 - 20 mg/dL _1 Creatinine 0.44 - 1.00 mg/dL 0.92 1.01(H) 0.87  Sodium 135 - 145 mmol/L 138 137 137  Potassium 3.5 - 5.1 mmol/L 3.2(L) 3.7 3.7  Chloride 98 - 111 mmol/L 102 104 103  CO2 22 - 32 mmol/L _0 Calcium 8.9 - 10.3 mg/dL 9.3 9.6 9.6  Total Protein 6.5 - 8.1 g/dL - - -  Total Bilirubin 0.3 - 1.2 mg/dL - - -  Alkaline Phos 38 - 126 U/L - - -  AST 15 - 41 U/L - - -  ALT 0 - 44 U/L - - -    Hepatic Function Latest Ref Rng & Units 02/10/2021 02/03/2021 01/23/2021  Total Protein 6.5 - 8.1 g/dL 5.8(L) 7.6 -  Albumin 3.5 - 5.0 g/dL 3.0(L) 3.7 2.3(L)  AST 15 - 41 U/L 28 41 -  ALT 0 - 44 U/L 22 29 -  Alk Phosphatase 38 - 126 U/L  73 121 -  Total Bilirubin 0.3 - 1.2 mg/dL 0.4 0.4 -  Bilirubin, Direct 0.0 - 0.2 mg/dL - - -    Abdominal film reveals nonspecific gas pattern.  No evidence of fecal impaction dilated bowel or air-fluid levels.  Assessment:  Colitis with high-grade sigmoid colon stricture which was dilated 3 days ago.  She remains on lactulose and still not having a bowel movement but she is passing flatus.  She does not have bowel obstruction.  She will need to continue polyethylene glycol on schedule since she is prone to constipation due to her medications.   Recommendations  Patient stable for discharge as discussed with Dr. [name]. Continue prednisone 40 mg daily for 5 more days and thereafter she will drop dose by 5 mg every week starting next week Friday. Regarding pain medication she is allergic to so many narcotics.  She is not allergic to tramadol which could be used on a as needed basis. She will be receiving third dose of infliximab in 4 weeks. Will arrange for office visit in 2 weeks.

## 2021-02-23 ENCOUNTER — Encounter (HOSPITAL_COMMUNITY): Payer: Self-pay | Admitting: Internal Medicine

## 2021-02-23 LAB — THIOPURINE METHYLTRANSFERASE (TPMT), RBC: TPMT Activity:: 21.9 Units/mL RBC

## 2021-02-24 LAB — SURGICAL PATHOLOGY

## 2021-03-10 ENCOUNTER — Telehealth (INDEPENDENT_AMBULATORY_CARE_PROVIDER_SITE_OTHER): Payer: Self-pay | Admitting: *Deleted

## 2021-03-10 ENCOUNTER — Encounter (HOSPITAL_COMMUNITY): Payer: Self-pay | Admitting: *Deleted

## 2021-03-10 ENCOUNTER — Telehealth (INDEPENDENT_AMBULATORY_CARE_PROVIDER_SITE_OTHER): Payer: Self-pay | Admitting: Gastroenterology

## 2021-03-10 ENCOUNTER — Emergency Department (HOSPITAL_COMMUNITY)
Admission: EM | Admit: 2021-03-10 | Discharge: 2021-03-10 | Disposition: A | Discharge status: Home / Self Care | Payer: 59 | Attending: Emergency Medicine | Admitting: Emergency Medicine

## 2021-03-10 DIAGNOSIS — I10 Essential (primary) hypertension: Secondary | ICD-10-CM | POA: Insufficient documentation

## 2021-03-10 DIAGNOSIS — F17210 Nicotine dependence, cigarettes, uncomplicated: Secondary | ICD-10-CM | POA: Insufficient documentation

## 2021-03-10 DIAGNOSIS — J45909 Unspecified asthma, uncomplicated: Secondary | ICD-10-CM | POA: Insufficient documentation

## 2021-03-10 DIAGNOSIS — Z91040 Latex allergy status: Secondary | ICD-10-CM | POA: Insufficient documentation

## 2021-03-10 DIAGNOSIS — R1032 Left lower quadrant pain: Secondary | ICD-10-CM | POA: Diagnosis present

## 2021-03-10 DIAGNOSIS — K5090 Crohn's disease, unspecified, without complications: Secondary | ICD-10-CM | POA: Insufficient documentation

## 2021-03-10 DIAGNOSIS — K50919 Crohn's disease, unspecified, with unspecified complications: Secondary | ICD-10-CM

## 2021-03-10 DIAGNOSIS — J449 Chronic obstructive pulmonary disease, unspecified: Secondary | ICD-10-CM | POA: Insufficient documentation

## 2021-03-10 DIAGNOSIS — Z8616 Personal history of COVID-19: Secondary | ICD-10-CM | POA: Insufficient documentation

## 2021-03-10 DIAGNOSIS — R195 Other fecal abnormalities: Secondary | ICD-10-CM | POA: Insufficient documentation

## 2021-03-10 DIAGNOSIS — E876 Hypokalemia: Secondary | ICD-10-CM | POA: Diagnosis not present

## 2021-03-10 DIAGNOSIS — K219 Gastro-esophageal reflux disease without esophagitis: Secondary | ICD-10-CM | POA: Insufficient documentation

## 2021-03-10 DIAGNOSIS — E119 Type 2 diabetes mellitus without complications: Secondary | ICD-10-CM | POA: Insufficient documentation

## 2021-03-10 DIAGNOSIS — D649 Anemia, unspecified: Secondary | ICD-10-CM | POA: Diagnosis not present

## 2021-03-10 DIAGNOSIS — Z79899 Other long term (current) drug therapy: Secondary | ICD-10-CM | POA: Insufficient documentation

## 2021-03-10 LAB — CBC WITH DIFFERENTIAL/PLATELET
Abs Immature Granulocytes: 0.37 10*3/uL — ABNORMAL HIGH (ref 0.00–0.07)
Basophils Absolute: 0.1 10*3/uL (ref 0.0–0.1)
Basophils Relative: 1 %
Eosinophils Absolute: 0 10*3/uL (ref 0.0–0.5)
Eosinophils Relative: 0 %
HCT: 35.7 % — ABNORMAL LOW (ref 36.0–46.0)
Hemoglobin: 11.5 g/dL — ABNORMAL LOW (ref 12.0–15.0)
Immature Granulocytes: 4 %
Lymphocytes Relative: 21 %
Lymphs Abs: 1.8 10*3/uL (ref 0.7–4.0)
MCH: 30.1 pg (ref 26.0–34.0)
MCHC: 32.2 g/dL (ref 30.0–36.0)
MCV: 93.5 fL (ref 80.0–100.0)
Monocytes Absolute: 0.3 10*3/uL (ref 0.1–1.0)
Monocytes Relative: 3 %
Neutro Abs: 5.9 10*3/uL (ref 1.7–7.7)
Neutrophils Relative %: 71 %
Platelets: 499 10*3/uL — ABNORMAL HIGH (ref 150–400)
RBC: 3.82 MIL/uL — ABNORMAL LOW (ref 3.87–5.11)
RDW: 14.7 % (ref 11.5–15.5)
WBC: 8.4 10*3/uL (ref 4.0–10.5)
nRBC: 0 % (ref 0.0–0.2)

## 2021-03-10 LAB — COMPREHENSIVE METABOLIC PANEL
ALT: 21 U/L (ref 0–44)
AST: 35 U/L (ref 15–41)
Albumin: 2.9 g/dL — ABNORMAL LOW (ref 3.5–5.0)
Alkaline Phosphatase: 102 U/L (ref 38–126)
Anion gap: 7 (ref 5–15)
BUN: 8 mg/dL (ref 6–20)
CO2: 28 mmol/L (ref 22–32)
Calcium: 8.3 mg/dL — ABNORMAL LOW (ref 8.9–10.3)
Chloride: 101 mmol/L (ref 98–111)
Creatinine, Ser: 0.77 mg/dL (ref 0.44–1.00)
GFR, Estimated: >60 mL/min (ref >60)
Glucose, Bld: 177 mg/dL — ABNORMAL HIGH (ref 70–99)
Potassium: 2.9 mmol/L — ABNORMAL LOW (ref 3.5–5.1)
Sodium: 136 mmol/L (ref 135–145)
Total Bilirubin: 0.5 mg/dL (ref 0.3–1.2)
Total Protein: 6 g/dL — ABNORMAL LOW (ref 6.5–8.1)

## 2021-03-10 LAB — MAGNESIUM: Magnesium: 1.9 mg/dL (ref 1.7–2.4)

## 2021-03-10 MED ORDER — HYDROCODONE-ACETAMINOPHEN 5-325 MG PO TABS: 1.0000 | ORAL_TABLET | ORAL | 0 refills | Status: DC | PRN

## 2021-03-10 MED ORDER — SODIUM CHLORIDE 0.9 % IV SOLN: INTRAVENOUS | Status: DC

## 2021-03-10 MED ORDER — ONDANSETRON HCL 8 MG PO TABS: 8.0000 mg | ORAL_TABLET | Freq: Three times a day (TID) | ORAL | 0 refills | Status: DC | PRN

## 2021-03-10 MED ORDER — ONDANSETRON 8 MG PO TBDP
8.0000 mg | ORAL_TABLET | Freq: Once | ORAL | Status: AC
  Administered 2021-03-10: 8 mg via ORAL
  Filled 2021-03-10: qty 1

## 2021-03-10 MED ORDER — POTASSIUM CHLORIDE 10 MEQ/100ML IV SOLN
10.0000 meq | INTRAVENOUS | Status: AC
  Administered 2021-03-10 (×2): 10 meq via INTRAVENOUS
  Filled 2021-03-10: qty 100

## 2021-03-10 MED ORDER — ALPRAZOLAM 0.5 MG PO TABS
1.0000 mg | ORAL_TABLET | Freq: Once | ORAL | Status: AC
  Administered 2021-03-10: 1 mg via ORAL
  Filled 2021-03-10: qty 2

## 2021-03-10 MED ORDER — HYDROCODONE-ACETAMINOPHEN 5-325 MG PO TABS
1.0000 | ORAL_TABLET | Freq: Once | ORAL | Status: AC
  Administered 2021-03-10: 1 via ORAL
  Filled 2021-03-10: qty 1

## 2021-03-10 MED ORDER — POTASSIUM CHLORIDE CRYS ER 20 MEQ PO TBCR: 20.0000 meq | EXTENDED_RELEASE_TABLET | Freq: Every day | ORAL | 0 refills | Status: DC

## 2021-03-10 MED ORDER — POTASSIUM CHLORIDE CRYS ER 20 MEQ PO TBCR
40.0000 meq | EXTENDED_RELEASE_TABLET | Freq: Once | ORAL | Status: AC
  Administered 2021-03-10: 40 meq via ORAL
  Filled 2021-03-10: qty 2

## 2021-03-10 NOTE — Telephone Encounter (Signed)
Pt called states she has had constant diarrhea to the point she cant leave the hospital, since being discharged from hospital on 10/28. This morning she noticed blood twice in stool. States she wears 2 -3 pads because diarrhea is not controlled and pad was soaked with blood. She has been lightheaded since being discharged on 10/28. Consult with dr Laural Golden and advised for pt to go to ED. Pt states she will go to [facility] ED.

## 2021-03-10 NOTE — ED Triage Notes (Signed)
Blood in stool noted today, diarrhea for 3 weeks

## 2021-03-10 NOTE — ED Provider Notes (Signed)
[facility] EMERGENCY DEPARTMENT Provider Note   CSN: [id number] Arrival date & time: 03/10/21  1430     History Chief Complaint  Patient presents with   Blood In [name] is a 50 y.o. female.  HPI She is here for evaluation of ongoing diarrhea, which she describes as small amounts, associated with left lower quadrant abdominal pain.  She was recently hospitalized and treated for same.  She has a colonic stricture associated with Crohn's disease and is currently on a prednisone taper for that.  At last colonoscopy, the stricture was unable to be transversed, so there was consideration made for transfer to a tertiary care facility.  However patient improved enough for discharge home.  She became concerned today because of ongoing diarrhea, associated with 2 episodes of a small amount of bleeding with the stool.  Therefore she decided come here for evaluation.  There are no other known active modifying factors.    Past Medical History:  Diagnosis Date   Anxiety    Asthma    Chronic low back pain    Collagen vascular disease ([place])    COPD (chronic obstructive pulmonary disease) (HCC)    Crohn's disease (HCC)    GERD (gastroesophageal reflux disease)    EGD 01/2007 by Dr.Rourke small hiatal hernia s/p 56 french maloney    History of head injury    Hypertension    IBS (irritable bowel syndrome)    PSVT (paroxysmal supraventricular tachycardia) (HCC)    PTSD (post-traumatic stress disorder)    Recurrent chest pain    Seizure disorder ([place])    Type 2 diabetes mellitus ([place])     Patient Active Problem List   Diagnosis Date Noted   Abdominal pain    Bloating    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[place])    Hematochezia 02/04/2021   Colitis 02/03/2021   Diarrhea    Acute metabolic encephalopathy 40/81/4481   Moderate Pericardial effusion 01/19/2021   Right foot pain    AKI (acute kidney injury) ([place]) 01/16/2021   Rhabdomyolysis 01/16/2021   Lactic acidosis 01/16/2021    COVID-19 virus infection 01/16/2021   Severe sepsis ([place]) 01/16/2021   Seizure ([place]) 06/24/2020   MDD (major depressive disorder) 03/24/2020   Chest pain 02/07/2020   Chronic patellofemoral pain of both knees 01/11/2020   Essential hypertension 06/17/2018   Insomnia 12/13/2017   Neuropathy 12/13/2017   Chest pain at rest 05/09/2016   COPD (chronic obstructive pulmonary disease) ([place])    Crohn's disease ([place])    Diabetes mellitus without complication ([place])    Anxiety    Tobacco abuse    PTSD (post-traumatic stress disorder)    Palpitation    Constipation 09/20/2012   Head injury 05/09/2012   Hyperlipidemia 04/11/2012   DJD (degenerative joint disease), lumbar 04/04/2012   Paroxysmal SVT (supraventricular tachycardia) ([place]) 04/04/2012   Tobacco dependence 04/04/2012   Dyspnea 01/26/2012   Paresthesia 11/25/2011   Abdominal pain, chronic, epigastric 11/01/2011   Abdominal pain, acute, periumbilical 85/63/1497   GERD (gastroesophageal reflux disease)    Hyperglycemia    Chronic low back pain    IBS (irritable bowel syndrome)    Closed head injury    TOBACCO ABUSE 07/13/2010   Palpitations 07/13/2010   Pleuritic chest pain 07/13/2010   Anxiety state, unspecified 07/03/2008   BACK PAIN, LUMBAR, CHRONIC 07/03/2008    Past Surgical History:  Procedure Laterality Date   ABDOMINAL HYSTERECTOMY     with right salpingo oophorectomy 2005  APPENDECTOMY  2006   BALLOON DILATION N/A 02/19/2021   Procedure: BALLOON DILATION;  Surgeon: Eloise Harman, DO;  Location: [facility];  Service: Endoscopy;  Laterality: N/A;  Sigmoid colon stricture   BIOPSY  02/06/2021   Procedure: BIOPSY;  Surgeon: Eloise Harman, DO;  Location: [facility];  Service: Endoscopy;;   BIOPSY  02/19/2021   Procedure: BIOPSY;  Surgeon: Eloise Harman, DO;  Location: [facility];  Service: Endoscopy;;   CESAREAN SECTION     1996   CHOLECYSTECTOMY     COLONOSCOPY  2010   Dr. Gala Romney; negative  except for hemorrhoids   ESOPHAGEAL DILATION N/A 10/25/2014   Procedure: ESOPHAGEAL DILATION;  Surgeon: Rogene Houston, MD;  Location: [facility];  Service: Endoscopy;  Laterality: N/A;   ESOPHAGOGASTRODUODENOSCOPY N/A 10/25/2014   Procedure: ESOPHAGOGASTRODUODENOSCOPY (EGD);  Surgeon: Rogene Houston, MD;  Location: [facility];  Service: Endoscopy;  Laterality: N/A;  [place] N/A 02/06/2021   Procedure: FLEXIBLE SIGMOIDOSCOPY;  Surgeon: Eloise Harman, DO;  Location: [facility];  Service: Endoscopy;  Laterality: N/A;   FLEXIBLE SIGMOIDOSCOPY N/A 02/19/2021   Procedure: FLEXIBLE SIGMOIDOSCOPY;  Surgeon: Eloise Harman, DO;  Location: [facility];  Service: Endoscopy;  Laterality: N/A;   OOPHORECTOMY     left for torsion and ovarian fibroma; uterine myoma resected 1995     OB History     Gravida  3   Para  1   Term  1   Preterm      AB  2   Living         SAB  2   IAB      Ectopic      Multiple      Live Births              Family History  Problem Relation Age of Onset   Cancer Mother    Heart failure Mother    Hypertension Mother    Heart attack Mother    Heart attack Father 72   Cancer Father 12   Heart failure Father    Diabetes Father    Crohn's disease Cousin     Social History   Tobacco Use   Smoking status: Some Days    Packs/day: 0.50    Years: 20.00    Pack years: 10.00    Types: Cigarettes    Start date: 09/26/1984   Smokeless tobacco: Never  Substance Use Topics   Alcohol use: No    Alcohol/week: 0.0 standard drinks   Drug use: Not Currently    Home Medications Prior to Admission medications   Medication Sig Start Date End Date Taking? Authorizing Provider  acetaminophen (TYLENOL) 325 MG tablet Take 2 tablets (650 mg total) by mouth every 6 (six) hours as needed for mild pain or fever (or Fever >/= 101). 01/24/21  Yes Emokpae, Courage, MD  ALPRAZolam [name]) 1 MG tablet Take 1 mg by mouth 3  (three) times daily as needed for anxiety. 07/11/19  Yes [provider]  amitriptyline (ELAVIL) 75 MG tablet Take 75 mg by mouth at bedtime. 12/16/20  Yes [provider]  cetirizine (ZYRTEC) 10 MG tablet Take 10 mg by mouth daily. 03/30/19  Yes [provider]  cyclobenzaprine (FLEXERIL) 10 MG tablet Take 10 mg by mouth 3 (three) times daily as needed for muscle spasms. 07/11/19  Yes [provider]  DULoxetine (CYMBALTA) 30 MG capsule Take  30 mg by mouth daily. 12/15/20  Yes [provider]  gabapentin (NEURONTIN) 300 MG capsule Take 300 mg by mouth 3 (three) times daily. 11/20/20  Yes [provider]  HYDROcodone-acetaminophen (NORCO) 5-325 MG tablet Take 1 tablet by mouth every 4 (four) hours as needed. 03/10/21  Yes Daleen Bo, MD  levETIRAcetam (KEPPRA) 750 MG tablet Take 750 mg by mouth 2 (two) times daily. 09/30/20  Yes [provider]  omeprazole (PRILOSEC) 40 MG capsule Take 40 mg by mouth daily. 07/21/19  Yes [provider]  ondansetron (ZOFRAN ODT) 8 MG disintegrating tablet Take 1 tablet (8 mg total) every 8 (eight) hours as needed by mouth for nausea or vomiting. 03/17/17  Yes Idol, Almyra Free, PA-C  ondansetron (ZOFRAN) 8 MG tablet Take 1 tablet (8 mg total) by mouth every 8 (eight) hours as needed for nausea or vomiting. 03/10/21  Yes Daleen Bo, MD  potassium chloride SA (KLOR-CON) 20 MEQ tablet Take 1 tablet (20 mEq total) by mouth daily. 03/10/21  Yes Daleen Bo, MD  predniSONE (DELTASONE) 10 MG tablet Take 4 tablets (40 mg total) by mouth daily with breakfast for 6 days, THEN 3.5 tablets (35 mg total) daily with breakfast for 7 days, THEN 3 tablets (30 mg total) daily with breakfast for 7 days, THEN 2.5 tablets (25 mg total) daily with breakfast for 7 days, THEN 2 tablets (20 mg total) daily with breakfast for 7 days, THEN 1.5 tablets (15 mg total) daily with breakfast for 7 days, THEN 1 tablet (10 mg total) daily  with breakfast for 7 days, THEN 0.5 tablets (5 mg total) daily with breakfast for 7 days. 02/22/21 04/18/21 Yes [name], Pratik D, DO  promethazine (PHENERGAN) 25 MG tablet Take 1 tablet (25 mg total) by mouth every 6 (six) hours as needed for nausea or vomiting. 07/30/19  Yes Noemi Chapel, MD  RESTASIS 0.05 % ophthalmic emulsion Place 1 drop into both eyes 2 (two) times daily. 12/12/20  Yes [provider]  albuterol (VENTOLIN HFA) 108 (90 Base) MCG/ACT inhaler Inhale 2 puffs into the lungs every 4 (four) hours as needed for wheezing or shortness of breath. 01/24/21   Roxan Hockey, MD  calcium carbonate (TUMS - DOSED IN MG ELEMENTAL CALCIUM) 500 MG chewable tablet Chew 1 tablet (200 mg of elemental calcium total) by mouth 3 (three) times daily with meals. 02/22/21 03/24/21  [name], Pratik D, DO  ezetimibe (ZETIA) 10 MG tablet Take 10 mg by mouth daily. 12/15/20   [provider]  folic acid (FOLVITE) 1 MG tablet Take 1 tablet (1 mg total) by mouth daily. 01/25/21   Roxan Hockey, MD  hydrOXYzine (ATARAX/VISTARIL) 25 MG tablet Take 1 tablet by mouth at bedtime. 07/11/19   [provider]  nitroGLYCERIN (NITROSTAT) 0.4 MG SL tablet Place 1 tablet (0.4 mg total) under the tongue every 5 (five) minutes as needed for chest pain. 04/15/15   Herminio Commons, MD  pantoprazole (PROTONIX) 40 MG tablet Take 1 tablet (40 mg total) by mouth daily. Patient not taking: Reported on 03/10/2021 02/22/21 03/24/21  [name], Pratik D, DO  SPIRIVA RESPIMAT 2.5 MCG/ACT AERS Inhale 1 puff into the lungs daily as needed (shortness of breath). 01/24/21   Roxan Hockey, MD    Allergies    Codeine, Dilaudid [hydromorphone hcl], Hydrocodone, Morphine and related, Oxycodone, Penicillins, Acetaminophen, Ativan [lorazepam], Strawberry extract, Watermelon concentrate, Benadryl [diphenhydramine hcl], and Latex  Review of Systems   Review of Systems  All other systems reviewed  and are  negative.  Physical Exam Updated Vital Signs BP 124/79   Pulse 86   Temp 98 F (36.7 C) (Oral)   Resp 15   Ht 5' 4.5" (1.638 m)   Wt 63.2 kg   SpO2 99%   BMI 23.56 kg/m   Physical Exam Vitals and nursing note reviewed.  Constitutional:      General: She is not in acute distress.    Appearance: She is well-developed. She is not ill-appearing, toxic-appearing or diaphoretic.  HENT:     Head: Normocephalic and atraumatic.     Right Ear: External ear normal.     Left Ear: External ear normal.     Nose: No congestion.     Mouth/Throat:     Mouth: Mucous membranes are moist.  Eyes:     Conjunctiva/sclera: Conjunctivae normal.     Pupils: Pupils are equal, round, and reactive to light.  Neck:     Trachea: Phonation normal.  Cardiovascular:     Rate and Rhythm: Normal rate and regular rhythm.     Heart sounds: Normal heart sounds.  Pulmonary:     Effort: Pulmonary effort is normal.     Breath sounds: Normal breath sounds.  Abdominal:     General: There is no distension.     Palpations: Abdomen is soft.     Tenderness: There is no abdominal tenderness.  Musculoskeletal:        General: Normal range of motion.     Cervical back: Normal range of motion and neck supple.  Skin:    General: Skin is warm and dry.  Neurological:     Mental Status: She is alert and oriented to person, place, and time.     Cranial Nerves: No cranial nerve deficit.     Sensory: No sensory deficit.     Motor: No abnormal muscle tone.     Coordination: Coordination normal.  Psychiatric:        Mood and Affect: Mood normal.        Behavior: Behavior normal.        Thought Content: Thought content normal.        Judgment: Judgment normal.    ED Results / Procedures / Treatments   Labs (all labs ordered are listed, but only abnormal results are displayed) Labs Reviewed  COMPREHENSIVE METABOLIC PANEL - Abnormal; Notable for the following components:      Result Value   Potassium 2.9 (*)     Glucose, Bld 177 (*)    Calcium 8.3 (*)    Total Protein 6.0 (*)    Albumin 2.9 (*)    All other components within normal limits  CBC WITH DIFFERENTIAL/PLATELET - Abnormal; Notable for the following components:   RBC 3.82 (*)    Hemoglobin 11.5 (*)    HCT 35.7 (*)    Platelets 499 (*)    Abs Immature Granulocytes 0.37 (*)    All other components within normal limits  MAGNESIUM  POC OCCULT BLOOD, ED    EKG EKG Interpretation  Date/Time:  Tuesday March 10 2021 15:26:21 EST Ventricular Rate:  111 PR Interval:  135 QRS Duration: 109 QT Interval:  335 QTC Calculation: 456 R Axis:   93 Text Interpretation: Sinus tachycardia LAE, consider biatrial enlargement Borderline right axis deviation Low voltage, precordial leads Probable anteroseptal infarct, old Baseline wander in lead(s) V6 Since last tracing rate faster Otherwise no significant change Confirmed by Daleen Bo [phone]) on 03/10/2021 4:48:06 PM  Radiology No  results found.  Procedures .Critical Care Performed by: Daleen Bo, MD Authorized by: Daleen Bo, MD   Critical care provider statement:    Critical care time (minutes):  35   Critical care start time:  03/10/2021 3:00 PM   Critical care end time:  03/10/2021 7:38 PM   Critical care time was exclusive of:  Separately billable procedures and treating other patients   Critical care was necessary to treat or prevent imminent or life-threatening deterioration of the following conditions:  Metabolic crisis   Critical care was time spent personally by me on the following activities:  Blood draw for specimens, development of treatment plan with patient or surrogate, discussions with consultants, evaluation of patient's response to treatment, examination of patient, ordering and performing treatments and interventions, ordering and review of laboratory studies, ordering and review of radiographic studies, pulse oximetry, re-evaluation of patient's condition and  review of old charts   Medications Ordered in ED Medications  0.9 %  sodium chloride infusion ( Intravenous New Bag/Given 03/10/21 1520)  ALPRAZolam (XANAX) tablet 1 mg (has no administration in time range)  HYDROcodone-acetaminophen (NORCO/VICODIN) 5-325 MG per tablet 1 tablet (has no administration in time range)  ondansetron (ZOFRAN-ODT) disintegrating tablet 8 mg (has no administration in time range)  potassium chloride SA (KLOR-CON) CR tablet 40 mEq (40 mEq Oral Given 03/10/21 1649)  potassium chloride 10 mEq in 100 mL IVPB (0 mEq Intravenous Stopped 03/10/21 1850)    ED Course  I have reviewed the triage vital signs and the nursing notes.  Pertinent labs & imaging results that were available during my care of the patient were reviewed by me and considered in my medical decision making (see chart for details).  Clinical Course as of 03/10/21 1945  Tue Mar 10, 2021  1618 Labs returned indicating significant hypokalemia.  This may be contributing to her malaise.  Hemoglobin is 11.5, not indicating significant blood loss [EW]  1849 I was able to communicate with her GI physician who stated that she could follow-up for further care and treatment in his office.  He is working on referral to a tertiary care center gastroenterology clinic for management of her Crohn's disorder complications [EW]    Clinical Course User Index [EW] Daleen Bo, MD   MDM Rules/Calculators/A&P                            Patient Vitals for the past 24 hrs:  BP Temp Temp src Pulse Resp SpO2 Height Weight  03/10/21 1830 124/79 -- -- 86 15 99 % -- --  03/10/21 1730 103/61 -- -- 95 13 98 % -- --  03/10/21 1700 128/70 -- -- 88 17 97 % -- --  03/10/21 1630 116/64 -- -- 92 16 97 % -- --  03/10/21 1600 119/64 -- -- 98 19 99 % -- --  03/10/21 1530 111/76 -- -- (!) 110 (!) 23 98 % -- --  03/10/21 1446 120/81 -- -- (!) 117 16 98 % -- --  03/10/21 1441 123/75 98 F (36.7 C) Oral (!) 125 15 95 % 5' 4.5" (1.638 m)  63.2 kg    7:34 PM Reevaluation with update and discussion. After initial assessment and treatment, an updated evaluation reveals patient states she feels better at this time.  Patient's husband is here now and states that the patient has been mostly troubled by episodes of low abdominal discomfort which is worse at night.  Patient request  pain medicine to use at night.  She states that when she takes tramadol she does not get any relief.  She would prefer to try hydrocodone product even though she has previously had listed allergies to same.  She feels like if she takes the medicine with Zofran, she will be able to tolerate it.  Findings discussed with the patient and her husband, all questions were answered. Daleen Bo   Medical Decision Making:  This patient is presenting for evaluation of diarrhea with rectal bleeding, which does require a range of treatment options, and is a complaint that involves a moderate risk of morbidity and mortality. The differential diagnoses include irritant diarrhea, Crohn's disease, infectious process. I decided to review old records, and in summary middle-aged female being treated for Crohn's disease, here for evaluation of ongoing diarrhea and rectal bleeding.  She has a Crohn's stricture, recently complicated, when evaluated with endoscopy.  Multiple additional medical problems including IBS, closed head injury, GERD, COPD, diabetes, and major depressive disorder.  I did not require additional historical information from anyone.  Clinical Laboratory Tests Ordered, included CBC and Metabolic panel. Review indicates normal except hemoglobin slightly low, platelets high, glucose high, potassium low, calcium low, albumin low. Radiologic Tests Ordered, included normal except hemoglobin slightly low, glucose high, potassium low, calcium low, total protein low, albumin low.    Critical Interventions-clinical evaluation, laboratory testing, IV fluids, IV medications,  observation, oral medications, arrangements for discharge  After These Interventions, the Patient was reevaluated and was found with stable chronic Crohn's disease abnormalities, and incidental hypokalemia.  Hemoglobin stable.  No indication for hospitalization at this time.  Vital signs stabilized, normal.  Etiology of hypokalemia is not clear.  Possible nutritional.  Will give short-term supplement and asked patient to have it rechecked, next week by her PCP or GI physician.  Plan will be to have her call GI for follow-up care, as planned with referral to tertiary care center GI clinic.  CRITICAL CARE-yes Performed by: Daleen Bo  Nursing Notes Reviewed/ Care Coordinated Applicable Imaging Reviewed Interpretation of Laboratory Data incorporated into ED treatment  The patient appears reasonably screened and/or stabilized for discharge and I doubt any other medical condition or other [facility] requiring further screening, evaluation, or treatment in the ED at this time prior to discharge.  Plan: Home Medications-continue usual; Home Treatments-rest, fluids; return here if the recommended treatment, does not improve the symptoms; Recommended follow up-PCP, PRN     Final Clinical Impression(s) / ED Diagnoses Final diagnoses:  Crohn's disease with complication, unspecified gastrointestinal tract location [facility])  Hypokalemia    Rx / DC Orders ED Discharge Orders          Ordered    HYDROcodone-acetaminophen (NORCO) 5-325 MG tablet  Every 4 hours PRN        03/10/21 1941    ondansetron (ZOFRAN) 8 MG tablet  Every 8 hours PRN        03/10/21 1941    potassium chloride SA (KLOR-CON) 20 MEQ tablet  Daily        03/10/21 1943             Daleen Bo, MD 03/10/21 1947

## 2021-03-10 NOTE — ED Notes (Signed)
IV attempted x2- unsuccessful.

## 2021-03-10 NOTE — Discharge Instructions (Addendum)
The testing today did not show serious problems.  We are prescribing a hydrocodone product to treat your pain.  You have had problems with narcotics in the past so do not take this medication if it causes swelling or rash.  Make sure you are eating a regular diet and drinking plenty of fluid to improve your condition.  Try to include foods which contain potassium, because your level was low today.  See the attached information.  We sent a prescription for potassium to your pharmacy.  Return here, if needed for problems.

## 2021-03-11 NOTE — Telephone Encounter (Signed)
Referral sent to [facility], they will contact patient to schedule apt

## 2021-03-11 NOTE — Telephone Encounter (Signed)
Authorization Submitted to the patient insurance [employer] with clinical information 03/11/2021.

## 2021-03-12 ENCOUNTER — Telehealth (INDEPENDENT_AMBULATORY_CARE_PROVIDER_SITE_OTHER): Payer: Self-pay | Admitting: *Deleted

## 2021-03-12 NOTE — Telephone Encounter (Signed)
Pt called on 11/8 and states she was having a lot of bleeding and diarrhea. She was advised to go to ED and she did. States she was released that same day and not any better. Has up to 10 epidsodes of diarrhea per day, wakes from sleep. Has not seen any blood in stool since the day she went to ED on 11/8. Not taking anything for diarrhea because she is not sure what she should be taking. States hospital dr wanted to give her pain med but she does not like taking pain meds. She has a follow up with pcp 11/17 but not sure she can make it to [facility] with the diarrhea and has a follow up with chelsea on 11/21

## 2021-03-13 NOTE — Telephone Encounter (Signed)
Per dr Laural Golden - pt needs 3rd dose of remacaid the week of 03/23/21. Can take imodium qhs and would like to move appt to Thursday 11/17.

## 2021-03-13 NOTE — Telephone Encounter (Signed)
Called and discussed with pt. Pt verbalized understanding.

## 2021-03-13 NOTE — Telephone Encounter (Signed)
03/13/2021 spoke with Lorie Apley, with [facility] [phone] they state there was some confusion on the servicing provider and the servicing facility. I cleared the confusion with them that both are [facility] and they are still in review. Can expect a determination on 03/16/2021 or 03/17/2021. Auth # [id number]

## 2021-03-13 NOTE — Telephone Encounter (Signed)
I submitted the authorization on this patient on 03/11/2021 to [facility] health, awaiting a determination from [facility] health.

## 2021-03-13 NOTE — Telephone Encounter (Signed)
Per dr Laural Golden pt needs appt moved up with him next Thursday on 11/17 for hospital follow up/ diarrhea

## 2021-03-16 NOTE — Telephone Encounter (Signed)
03/16/2021 : Per Jasmine with [place] patient approved from 03/11/2021-05/02/2021. Auth # [id number].

## 2021-03-16 NOTE — Telephone Encounter (Addendum)
Patient aware her third Remicad infusion is scheduled for 03/25/2021 at 8 am at [facility].   Patient aware that the Remicade approved and that [facility] to reach out to her to set up the third dose. I advisd that authorized through end of the year,and she will need to let me know if anything changes with her insurance.

## 2021-03-19 ENCOUNTER — Ambulatory Visit (INDEPENDENT_AMBULATORY_CARE_PROVIDER_SITE_OTHER): Payer: 59 | Admitting: Internal Medicine

## 2021-03-19 ENCOUNTER — Other Ambulatory Visit (INDEPENDENT_AMBULATORY_CARE_PROVIDER_SITE_OTHER): Payer: Self-pay

## 2021-03-19 ENCOUNTER — Other Ambulatory Visit: Payer: Self-pay

## 2021-03-19 ENCOUNTER — Encounter (INDEPENDENT_AMBULATORY_CARE_PROVIDER_SITE_OTHER): Payer: Self-pay | Admitting: Internal Medicine

## 2021-03-19 VITALS — BP 93/55 | HR 108 | Temp 97.3°F | Ht 65.0 in | Wt 145.2 lb

## 2021-03-19 DIAGNOSIS — K50119 Crohn's disease of large intestine with unspecified complications: Secondary | ICD-10-CM

## 2021-03-19 DIAGNOSIS — K56699 Other intestinal obstruction unspecified as to partial versus complete obstruction: Secondary | ICD-10-CM | POA: Diagnosis not present

## 2021-03-19 MED ORDER — LOPERAMIDE HCL 2 MG PO CAPS: 2.0000 mg | ORAL_CAPSULE | Freq: Every day | ORAL | Status: DC

## 2021-03-19 MED ORDER — NITROGLYCERIN 0.4 MG SL SUBL: 0.4000 mg | SUBLINGUAL_TABLET | SUBLINGUAL | 0 refills | Status: DC | PRN

## 2021-03-19 NOTE — Patient Instructions (Signed)
Physician will call with results of abdominal x-rays when completed. Prednisone taper as follows 25 mg by mouth every morning for 1 week 20 mg by mouth every morning for 1 week 15 mg by mouth every morning for 1 week 10 mg by mouth every morning for 1 week 5 mg by mouth every morning for 1 week and stop.  Will plan abdominal pelvic CT couple of days prior to next office visit.

## 2021-03-19 NOTE — Progress Notes (Signed)
Presenting complaint;  Follow-up for Crohn's colitis and colonic stricture.  Database and subjective:  Patient is 50 year old Caucasian female who is here for scheduled visit. Patient has multiple comorbidities which include COPD diabetes mellitus hypertension anxiety, collagen vascular disease PSVT,seizure disorder as well as GERD.  She also has history of Crohn's disease which was diagnosed in 1998 at at [facility].  She was treated for a while and then got lost to follow-up.  These records are not available.  Patient was admitted to [facility] in September 2022 for diarrhea and colitis.  Stool studies were negative.  She was empirically treated with antibiotics.  She was discharged on 01/24/2021 and returned 10 days later rectal bleeding of 1 week duration abdominal pain nausea and intermittent vomiting.A/P CT revealed wall thickening to sigmoid colon and rectosigmoid junction.  C. difficile and GI pathogen panel were negative.  Sigmoidoscopy on 02/06/2021 revealed severe diffuse inflammation/ulceration involving rectum and distal sigmoid colon with high-grade stricture.  Biopsies revealed granulation tissue with marked acute inflammation but no viable colonic mucosa.  She was treated with IV steroids.  QuantiFERON-TB gold plus and hepatitis B surface antigen were negative.  She was not responding well to IV steroids.  It was decided to proceed with biologic therapy.  She was given first dose of infliximab on 02/16/2021. She underwent repeat flexible sigmoidoscopy on 02/19/2021 and noted to have less inflammation.  Biopsies were obtained.  Dr. Abbey Chatters was able to dilate distal sigmoid stricture to 20 mm with a balloon.  Biopsy revealed chronic active colitis without granulomas.  Immunohistochemical stain for CMV were negative.  She had formed stool proximal to stricture and mucosa did not appear to be inflamed.  Patient received second dose of infliximab a week later.  She began to feel  better.  She was also given polyethylene glycol to make her bowels move since she had a lot of formed stool proximal to the stricture.  Patient was finally able to go home on 02/22/2021.  Review of her records reveal that she was also seen at [facility] and current will on 02/27/2021 evaluated and discharged.  Once again GI pathogen panel was negative and CT revealed more or less similar changes in formed stool in proximal colon.  She is accompanied by her partner Jeneen Rinks.  She is not taking prednisone 30 mg daily.  She remains with diarrhea.  She generally has 10 stools per day.  Stool volume is small.  Every now and then she passes soft stool.  She sees blood every couple of days but is also small in amount.  Her appetite has improved.  Since she left the hospital she only had 1 episode of vomiting.  She is wearing diapers as she has urgency and she at times she is incontinent.  She is not taking any NSAIDs.  She feels she has gained few pounds since she left the hospital. She was seen in emergency room for 9 days ago.  She was evaluated at discharge.  She did not undergo CT. Patient is requesting a nitroglycerin refills.  Prior prescription as expired.   Current Medications: Outpatient Encounter Medications as of 03/19/2021  Medication Sig   acetaminophen (TYLENOL) 325 MG tablet Take 2 tablets (650 mg total) by mouth every 6 (six) hours as needed for mild pain or fever (or Fever >/= 101).   albuterol (VENTOLIN HFA) 108 (90 Base) MCG/ACT inhaler Inhale 2 puffs into the lungs every 4 (four) hours as needed for wheezing  or shortness of breath.   ALPRAZolam (XANAX) 1 MG tablet Take 1 mg by mouth 3 (three) times daily as needed for anxiety.   amitriptyline (ELAVIL) 75 MG tablet Take 75 mg by mouth at bedtime.   calcium carbonate (TUMS - DOSED IN MG ELEMENTAL CALCIUM) 500 MG chewable tablet Chew 1 tablet (200 mg of elemental calcium total) by mouth 3 (three) times daily with meals.   cetirizine  (ZYRTEC) 10 MG tablet Take 10 mg by mouth daily.   cyclobenzaprine (FLEXERIL) 10 MG tablet Take 10 mg by mouth 3 (three) times daily as needed for muscle spasms.   DULoxetine (CYMBALTA) 30 MG capsule Take 30 mg by mouth daily.   folic acid (FOLVITE) 1 MG tablet Take 1 tablet (1 mg total) by mouth daily.   gabapentin (NEURONTIN) 300 MG capsule Take 300 mg by mouth 3 (three) times daily.   levETIRAcetam (KEPPRA) 750 MG tablet Take 750 mg by mouth 2 (two) times daily.   nitroGLYCERIN (NITROSTAT) 0.4 MG SL tablet Place 1 tablet (0.4 mg total) under the tongue every 5 (five) minutes as needed for chest pain.   omeprazole (PRILOSEC) 40 MG capsule Take 40 mg by mouth daily.   ondansetron (ZOFRAN ODT) 8 MG disintegrating tablet Take 1 tablet (8 mg total) every 8 (eight) hours as needed by mouth for nausea or vomiting.   potassium chloride SA (KLOR-CON) 20 MEQ tablet Take 1 tablet (20 mEq total) by mouth daily.   predniSONE (DELTASONE) 10 MG tablet Take 4 tablets (40 mg total) by mouth daily with breakfast for 6 days, THEN 3.5 tablets (35 mg total) daily with breakfast for 7 days, THEN 3 tablets (30 mg total) daily with breakfast for 7 days, THEN 2.5 tablets (25 mg total) daily with breakfast for 7 days, THEN 2 tablets (20 mg total) daily with breakfast for 7 days, THEN 1.5 tablets (15 mg total) daily with breakfast for 7 days, THEN 1 tablet (10 mg total) daily with breakfast for 7 days, THEN 0.5 tablets (5 mg total) daily with breakfast for 7 days.   promethazine (PHENERGAN) 25 MG tablet Take 1 tablet (25 mg total) by mouth every 6 (six) hours as needed for nausea or vomiting.   RESTASIS 0.05 % ophthalmic emulsion Place 1 drop into both eyes 2 (two) times daily.   SPIRIVA RESPIMAT 2.5 MCG/ACT AERS Inhale 1 puff into the lungs daily as needed (shortness of breath).   HYDROcodone-acetaminophen (NORCO) 5-325 MG tablet Take 1 tablet by mouth every 4 (four) hours as needed. (Patient not taking: Reported on  03/19/2021)   hydrOXYzine (ATARAX/VISTARIL) 25 MG tablet Take 1 tablet by mouth at bedtime. (Patient not taking: Reported on 03/19/2021)   [DISCONTINUED] ezetimibe (ZETIA) 10 MG tablet Take 10 mg by mouth daily.   [DISCONTINUED] ondansetron (ZOFRAN) 8 MG tablet Take 1 tablet (8 mg total) by mouth every 8 (eight) hours as needed for nausea or vomiting.   [DISCONTINUED] pantoprazole (PROTONIX) 40 MG tablet Take 1 tablet (40 mg total) by mouth daily. (Patient not taking: Reported on 03/10/2021)   No facility-administered encounter medications on file as of 03/19/2021.     Objective: Blood pressure (!) 93/55, pulse (!) 108, temperature (!) 97.3 F (36.3 C), temperature source Oral, height 5' 5"  (1.651 m), weight 145 lb 3.2 oz (65.9 kg). Patient is alert and in no acute distress. She has developed round facies. Conjunctiva is pink. Sclera is nonicteric Oropharyngeal mucosa is normal. No neck masses or thyromegaly noted. Cardiac exam  with regular rhythm normal S1 and S2. No murmur or gallop noted. Lungs are clear to auscultation. Abdomen is full but not distended or tense.  Bowel sounds are normal.  On palpation abdomen is soft without tenderness organomegaly or masses. No LE edema or clubbing noted.  Labs/studies Results:   CBC Latest Ref Rng & Units 03/10/2021 02/22/2021 02/19/2021  WBC 4.0 - 10.5 K/uL 8.4 9.4 9.8  Hemoglobin 12.0 - 15.0 g/dL 11.5(L) 10.9(L) 11.8(L)  Hematocrit 36.0 - 46.0 % 35.7(L) 35.2(L) 37.7  Platelets 150 - 400 K/uL 499(H) 293 337    CMP Latest Ref Rng & Units 03/10/2021 02/22/2021 02/19/2021  Glucose 70 - 99 mg/dL 177(H) 118(H) 102(H)  BUN 6 - 20 mg/dL 8 13 13   Creatinine 0.44 - 1.00 mg/dL 0.77 0.92 1.01(H)  Sodium 135 - 145 mmol/L 136 138 137  Potassium 3.5 - 5.1 mmol/L 2.9(L) 3.2(L) 3.7  Chloride 98 - 111 mmol/L 101 102 104  CO2 22 - 32 mmol/L 28 30 24   Calcium 8.9 - 10.3 mg/dL 8.3(L) 9.3 9.6  Total Protein 6.5 - 8.1 g/dL 6.0(L) - -  Total Bilirubin 0.3 -  1.2 mg/dL 0.5 - -  Alkaline Phos 38 - 126 U/L 102 - -  AST 15 - 41 U/L 35 - -  ALT 0 - 44 U/L 21 - -    Hepatic Function Latest Ref Rng & Units 03/10/2021 02/10/2021 02/03/2021  Total Protein 6.5 - 8.1 g/dL 6.0(L) 5.8(L) 7.6  Albumin 3.5 - 5.0 g/dL 2.9(L) 3.0(L) 3.7  AST 15 - 41 U/L 35 28 41  ALT 0 - 44 U/L 21 22 29   Alk Phosphatase 38 - 126 U/L 102 73 121  Total Bilirubin 0.3 - 1.2 mg/dL 0.5 0.4 0.4  Bilirubin, Direct 0.0 - 0.2 mg/dL - - -    Lab Results  Component Value Date   CRP 0.5 02/12/2021      Assessment:  #1.  High-grade distal colonic stricture secondary to colonic Crohn's disease.  She appears to be responding to therapy.  She has received 2 doses of infliximab and is due for her third dose on 03/25/2021.  Thereafter she will be on maintenance dose which I would recommend every 4 weeks if possible otherwise every 8 weeks.  She will continue to taper prednisone. She can use low-dose loperamide to reduce stool frequency.  Our concern is that she might develop constipation once again.  I also wonder if diarrhea has for years.  #2.  Mild anemia secondary to GI blood loss and chronic disease.   Plan:  Acute abdominal series. Completed induction therapy within Flomax with third dose to be given on 03/25/2021. Imodium OTC 2 mg by mouth daily with breakfast. Decrease prednisone dose to 25 mg p.o. daily for 1 week and thereafter drop dose by 5 mg every week. Patient was given prescription for nitroglycerin 0.4 mg sublingual as needed 100 doses without refill. Will plan abdominal pelvic CT with contrast prior to her next visit on 04/30/2021.

## 2021-03-20 ENCOUNTER — Ambulatory Visit (HOSPITAL_COMMUNITY)
Admission: RE | Admit: 2021-03-20 | Discharge: 2021-03-20 | Disposition: A | Discharge status: Home / Self Care | Payer: 59 | Source: Ambulatory Visit | Attending: Internal Medicine | Admitting: Internal Medicine

## 2021-03-20 DIAGNOSIS — K50119 Crohn's disease of large intestine with unspecified complications: Secondary | ICD-10-CM | POA: Diagnosis present

## 2021-03-20 DIAGNOSIS — K56699 Other intestinal obstruction unspecified as to partial versus complete obstruction: Secondary | ICD-10-CM | POA: Insufficient documentation

## 2021-03-23 ENCOUNTER — Ambulatory Visit (INDEPENDENT_AMBULATORY_CARE_PROVIDER_SITE_OTHER): Payer: 59 | Admitting: Gastroenterology

## 2021-03-25 ENCOUNTER — Encounter (HOSPITAL_COMMUNITY)
Admission: RE | Admit: 2021-03-25 | Discharge: 2021-03-25 | Disposition: A | Discharge status: Home / Self Care | Payer: 59 | Source: Ambulatory Visit | Attending: Internal Medicine | Admitting: Internal Medicine

## 2021-04-01 ENCOUNTER — Encounter (HOSPITAL_COMMUNITY): Payer: Self-pay | Admitting: *Deleted

## 2021-04-01 ENCOUNTER — Emergency Department (HOSPITAL_COMMUNITY)
Admission: EM | Admit: 2021-04-01 | Discharge: 2021-04-01 | Discharge status: Against Medical Advice | Payer: 59 | Attending: Emergency Medicine | Admitting: Emergency Medicine

## 2021-04-01 ENCOUNTER — Other Ambulatory Visit: Payer: Self-pay

## 2021-04-01 ENCOUNTER — Telehealth (INDEPENDENT_AMBULATORY_CARE_PROVIDER_SITE_OTHER): Payer: Self-pay | Admitting: *Deleted

## 2021-04-01 DIAGNOSIS — E1140 Type 2 diabetes mellitus with diabetic neuropathy, unspecified: Secondary | ICD-10-CM | POA: Diagnosis not present

## 2021-04-01 DIAGNOSIS — J45909 Unspecified asthma, uncomplicated: Secondary | ICD-10-CM | POA: Diagnosis not present

## 2021-04-01 DIAGNOSIS — R000 Tachycardia, unspecified: Secondary | ICD-10-CM | POA: Insufficient documentation

## 2021-04-01 DIAGNOSIS — R1030 Lower abdominal pain, unspecified: Secondary | ICD-10-CM | POA: Diagnosis present

## 2021-04-01 DIAGNOSIS — K625 Hemorrhage of anus and rectum: Secondary | ICD-10-CM | POA: Insufficient documentation

## 2021-04-01 DIAGNOSIS — J449 Chronic obstructive pulmonary disease, unspecified: Secondary | ICD-10-CM | POA: Diagnosis not present

## 2021-04-01 DIAGNOSIS — Z7951 Long term (current) use of inhaled steroids: Secondary | ICD-10-CM | POA: Diagnosis not present

## 2021-04-01 DIAGNOSIS — Z8616 Personal history of COVID-19: Secondary | ICD-10-CM | POA: Diagnosis not present

## 2021-04-01 DIAGNOSIS — R197 Diarrhea, unspecified: Secondary | ICD-10-CM | POA: Insufficient documentation

## 2021-04-01 DIAGNOSIS — Z91040 Latex allergy status: Secondary | ICD-10-CM | POA: Insufficient documentation

## 2021-04-01 DIAGNOSIS — F17210 Nicotine dependence, cigarettes, uncomplicated: Secondary | ICD-10-CM | POA: Diagnosis not present

## 2021-04-01 DIAGNOSIS — I10 Essential (primary) hypertension: Secondary | ICD-10-CM | POA: Diagnosis not present

## 2021-04-01 MED ORDER — SODIUM CHLORIDE 0.9 % IV BOLUS: 1000.0000 mL | Freq: Once | INTRAVENOUS | Status: DC

## 2021-04-01 NOTE — ED Provider Notes (Signed)
[facility] EMERGENCY DEPARTMENT Provider Note   CSN: [id number] Arrival date & time: 04/01/21  1644     History Chief Complaint  Patient presents with   GI Bleeding    [name] is a 50 y.o. female.  She has a history of Crohn's disease and has a colonic stricture.  Was here about 3 weeks ago for low abdominal pain loose stool with blood.  Treated with antibiotics.  She said she is continue to have diarrhea multiple times a day since then.  Complaining of lower abdominal pain crampy in nature.  She also said she has had a fever up to 101.  Talk to her GI doctor who recommended she come to the emergency department.  No urinary symptoms no vaginal bleeding.  No cough or shortness of breath.  She is currently still on a prednisone taper.  The history is provided by the patient.  Diarrhea Quality:  Watery Severity:  Severe Onset quality:  Gradual Duration:  3 weeks Timing:  Intermittent Progression:  Unchanged Relieved by:  Nothing Worsened by:  Nothing Ineffective treatments:  None tried Associated symptoms: abdominal pain and fever   Associated symptoms: no recent cough, no headaches and no vomiting   Risk factors: recent antibiotic use       Past Medical History:  Diagnosis Date   Anxiety    Asthma    Chronic low back pain    Collagen vascular disease (HCC)    COPD (chronic obstructive pulmonary disease) (HCC)    Crohn's disease (HCC)    GERD (gastroesophageal reflux disease)    EGD 01/2007 by Dr.Rourke small hiatal hernia s/p 56 french maloney    History of head injury    Hypertension    IBS (irritable bowel syndrome)    PSVT (paroxysmal supraventricular tachycardia) (HCC)    PTSD (post-traumatic stress disorder)    Recurrent chest pain    Seizure disorder ([place])    Type 2 diabetes mellitus ([place])     Patient Active Problem List   Diagnosis Date Noted   Abdominal pain    Bloating    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[place])    Hematochezia 02/04/2021   Crohn's  colitis ([place]) 02/03/2021   Diarrhea    Acute metabolic encephalopathy 23/76/2831   Moderate Pericardial effusion 01/19/2021   Right foot pain    AKI (acute kidney injury) ([place]) 01/16/2021   Rhabdomyolysis 01/16/2021   Lactic acidosis 01/16/2021   COVID-19 virus infection 01/16/2021   Severe sepsis ([place]) 01/16/2021   Seizure ([place]) 06/24/2020   MDD (major depressive disorder) 03/24/2020   Chest pain 02/07/2020   Chronic patellofemoral pain of both knees 01/11/2020   Essential hypertension 06/17/2018   Insomnia 12/13/2017   Neuropathy 12/13/2017   Chest pain at rest 05/09/2016   COPD (chronic obstructive pulmonary disease) ([place])    Crohn's disease ([place])    Diabetes mellitus without complication ([place])    Anxiety    Tobacco abuse    PTSD (post-traumatic stress disorder)    Palpitation    Constipation 09/20/2012   Head injury 05/09/2012   Hyperlipidemia 04/11/2012   DJD (degenerative joint disease), lumbar 04/04/2012   Paroxysmal SVT (supraventricular tachycardia) ([place]) 04/04/2012   Tobacco dependence 04/04/2012   Dyspnea 01/26/2012   Paresthesia 11/25/2011   Abdominal pain, chronic, epigastric 11/01/2011   Abdominal pain, acute, periumbilical 51/76/1607   GERD (gastroesophageal reflux disease)    Hyperglycemia    Chronic low back pain    IBS (irritable bowel syndrome)  Closed head injury    TOBACCO ABUSE 07/13/2010   Palpitations 07/13/2010   Pleuritic chest pain 07/13/2010   Anxiety state, unspecified 07/03/2008   BACK PAIN, LUMBAR, CHRONIC 07/03/2008    Past Surgical History:  Procedure Laterality Date   ABDOMINAL HYSTERECTOMY     with right salpingo oophorectomy 2005   APPENDECTOMY  2006   BALLOON DILATION N/A 02/19/2021   Procedure: BALLOON DILATION;  Surgeon: Eloise Harman, DO;  Location: [facility] END[facility];  Service: Endoscopy;  Laterality: N/A;  Sigmoid colon stricture   BIOPSY  02/06/2021   Procedure: BIOPSY;  Surgeon: Eloise Harman, DO;  Location: [facility]  ENDO SUITE;  Service: Endoscopy;;   BIOPSY  02/19/2021   Procedure: BIOPSY;  Surgeon: Eloise Harman, DO;  Location: [facility] END[facility];  Service: Endoscopy;;   CESAREAN SECTION     1996   CHOLECYSTECTOMY     COLONOSCOPY  2010   Dr. Gala Romney; negative except for hemorrhoids   ESOPHAGEAL DILATION N/A 10/25/2014   Procedure: ESOPHAGEAL DILATION;  Surgeon: Rogene Houston, MD;  Location: [facility] ENDO [facility];  Service: Endoscopy;  Laterality: N/A;   ESOPHAGOGASTRODUODENOSCOPY N/A 10/25/2014   Procedure: ESOPHAGOGASTRODUODENOSCOPY (EGD);  Surgeon: Rogene Houston, MD;  Location: [facility];  Service: Endoscopy;  Laterality: N/A;  [place] N/A 02/06/2021   Procedure: FLEXIBLE SIGMOIDOSCOPY;  Surgeon: Eloise Harman, DO;  Location: [facility];  Service: Endoscopy;  Laterality: N/A;   FLEXIBLE SIGMOIDOSCOPY N/A 02/19/2021   Procedure: FLEXIBLE SIGMOIDOSCOPY;  Surgeon: Eloise Harman, DO;  Location: [facility];  Service: Endoscopy;  Laterality: N/A;   OOPHORECTOMY     left for torsion and ovarian fibroma; uterine myoma resected 1995     OB History     Gravida  3   Para  1   Term  1   Preterm      AB  2   Living         SAB  2   IAB      Ectopic      Multiple      Live Births              Family History  Problem Relation Age of Onset   Cancer Mother    Heart failure Mother    Hypertension Mother    Heart attack Mother    Heart attack Father 20   Cancer Father 11   Heart failure Father    Diabetes Father    Crohn's disease Cousin     Social History   Tobacco Use   Smoking status: Some Days    Packs/day: 0.50    Years: 20.00    Pack years: 10.00    Types: Cigarettes    Start date: 09/26/1984   Smokeless tobacco: Never  Vaping Use   Vaping Use: Never used  Substance Use Topics   Alcohol use: No    Alcohol/week: 0.0 standard drinks   Drug use: Not Currently    Home Medications Prior to Admission medications   Medication Sig  Start Date End Date Taking? Authorizing Provider  acetaminophen (TYLENOL) 325 MG tablet Take 2 tablets (650 mg total) by mouth every 6 (six) hours as needed for mild pain or fever (or Fever >/= 101). 01/24/21   Roxan Hockey, MD  albuterol (VENTOLIN HFA) 108 (90 Base) MCG/ACT inhaler Inhale 2 puffs into the lungs every 4 (four) hours as needed for wheezing or shortness of breath. 01/24/21  Roxan Hockey, MD  ALPRAZolam [name]) 1 MG tablet Take 1 mg by mouth 3 (three) times daily as needed for anxiety. 07/11/19   [provider]  amitriptyline (ELAVIL) 75 MG tablet Take 75 mg by mouth at bedtime. 12/16/20   [provider]  cetirizine (ZYRTEC) 10 MG tablet Take 10 mg by mouth daily. 03/30/19   [provider]  cyclobenzaprine (FLEXERIL) 10 MG tablet Take 10 mg by mouth 3 (three) times daily as needed for muscle spasms. 07/11/19   [provider]  DULoxetine (CYMBALTA) 30 MG capsule Take 30 mg by mouth daily. 12/15/20   [provider]  folic acid (FOLVITE) 1 MG tablet Take 1 tablet (1 mg total) by mouth daily. 01/25/21   Roxan Hockey, MD  levETIRAcetam (KEPPRA) 750 MG tablet Take 750 mg by mouth 2 (two) times daily. 09/30/20   [provider]  loperamide (IMODIUM) 2 MG capsule Take 1 capsule (2 mg total) by mouth daily with breakfast. 03/19/21   Rehman, Mechele Dawley, MD  nitroGLYCERIN (NITROSTAT) 0.4 MG SL tablet Place 1 tablet (0.4 mg total) under the tongue every 5 (five) minutes as needed for chest pain. 03/19/21   Rogene Houston, MD  omeprazole (PRILOSEC) 40 MG capsule Take 40 mg by mouth daily. 07/21/19   [provider]  ondansetron (ZOFRAN ODT) 8 MG disintegrating tablet Take 1 tablet (8 mg total) every 8 (eight) hours as needed by mouth for nausea or vomiting. 03/17/17   Evalee Jefferson, PA-C  potassium chloride SA (KLOR-CON) 20 MEQ tablet Take 1 tablet (20 mEq total) by mouth daily. 03/10/21   Daleen Bo, MD  predniSONE (DELTASONE)  10 MG tablet Take 4 tablets (40 mg total) by mouth daily with breakfast for 6 days, THEN 3.5 tablets (35 mg total) daily with breakfast for 7 days, THEN 3 tablets (30 mg total) daily with breakfast for 7 days, THEN 2.5 tablets (25 mg total) daily with breakfast for 7 days, THEN 2 tablets (20 mg total) daily with breakfast for 7 days, THEN 1.5 tablets (15 mg total) daily with breakfast for 7 days, THEN 1 tablet (10 mg total) daily with breakfast for 7 days, THEN 0.5 tablets (5 mg total) daily with breakfast for 7 days. 02/22/21 04/18/21  [name], Pratik D, DO  promethazine (PHENERGAN) 25 MG tablet Take 1 tablet (25 mg total) by mouth every 6 (six) hours as needed for nausea or vomiting. 07/30/19   Noemi Chapel, MD  RESTASIS 0.05 % ophthalmic emulsion Place 1 drop into both eyes 2 (two) times daily. 12/12/20   [provider]  SPIRIVA RESPIMAT 2.5 MCG/ACT AERS Inhale 1 puff into the lungs daily as needed (shortness of breath). 01/24/21   Roxan Hockey, MD    Allergies    Codeine, Dilaudid [hydromorphone hcl], Hydrocodone, Morphine and related, Oxycodone, Penicillins, Acetaminophen, Ativan [lorazepam], Strawberry extract, Watermelon concentrate, Benadryl [diphenhydramine hcl], and Latex  Review of Systems   Review of Systems  Constitutional:  Positive for fever.  HENT:  Negative for sore throat.   Eyes:  Negative for visual disturbance.  Respiratory:  Negative for shortness of breath.   Cardiovascular:  Negative for chest pain.  Gastrointestinal:  Positive for abdominal pain and diarrhea. Negative for vomiting.  Genitourinary:  Negative for dysuria.  Musculoskeletal:  Negative for neck pain.  Skin:  Negative for rash.  Neurological:  Negative for headaches.   Physical Exam Updated Vital Signs BP 111/63 (BP Location: Right Arm)   Pulse (!) 113  Temp (!) 97.5 F (36.4 C) (Oral)   Resp 16   Ht 5' 5"  (1.651 m)   Wt 61.7 kg   SpO2 94%   BMI 22.63 kg/m   Physical Exam Vitals and  nursing note reviewed.  Constitutional:      General: She is not in acute distress.    Appearance: Normal appearance. She is well-developed.  HENT:     Head: Normocephalic and atraumatic.  Eyes:     Conjunctiva/sclera: Conjunctivae normal.  Cardiovascular:     Rate and Rhythm: Regular rhythm. Tachycardia present.     Heart sounds: No murmur heard. Pulmonary:     Effort: Pulmonary effort is normal. No respiratory distress.     Breath sounds: Normal breath sounds.  Abdominal:     Palpations: Abdomen is soft.     Tenderness: There is no abdominal tenderness. There is no guarding or rebound.  Musculoskeletal:        General: No swelling. Normal range of motion.     Cervical back: Neck supple.     Right lower leg: No edema.     Left lower leg: No edema.  Skin:    General: Skin is warm and dry.     Capillary Refill: Capillary refill takes less than 2 seconds.  Neurological:     General: No focal deficit present.     Mental Status: She is alert.  Psychiatric:        Mood and Affect: Mood normal.    ED Results / Procedures / Treatments   Labs (all labs ordered are listed, but only abnormal results are displayed) Labs Reviewed - No data to display  EKG None  Radiology No results found.  Procedures Procedures   Medications Ordered in ED Medications - No data to display   ED Course  I have reviewed the triage vital signs and the nursing notes.  Pertinent labs & imaging results that were available during my care of the patient were reviewed by me and considered in my medical decision making (see chart for details).  Clinical Course as of 04/02/21 1045  Wed Apr 01, 2021  1914 I was informed by charge nurse that patient left just after I had seen her. [MB]    Clinical Course User Index [MB] Hayden Rasmussen, MD   MDM Rules/Calculators/A&P                          This patient complains of abdominal pain and rectal bleeding; this involves an extensive number of  treatment Options and is a complaint that carries with it a high risk of complications and Morbidity. The differential includes colitis, diverticulitis, obstruction, peptic ulcer disease chronic abdominal pain,   [name] was evaluated in Emergency Department on 04/01/2021 for the symptoms described in the history of present illness. She was evaluated in the context of the global COVID-19 pandemic, which necessitated consideration that the patient might be at risk for infection with the SARS-CoV-2 virus that causes COVID-19. Institutional protocols and algorithms that pertain to the evaluation of patients at risk for COVID-19 are in a state of rapid change based on information released by regulatory bodies including the CDC and federal and state organizations. These policies and algorithms were followed during the patient's care in the ED.   Final Clinical Impression(s) / ED Diagnoses Final diagnoses:  Abdominal pain, lower  Rectal bleeding  Diarrhea, unspecified type    Rx / DC Orders ED Discharge  Orders     None        Hayden Rasmussen, MD 04/02/21 1047

## 2021-04-01 NOTE — ED Triage Notes (Signed)
Noted blood in stools, bright red at times.  Denies any black stools.  Since last time she was here on 11/8

## 2021-04-01 NOTE — Telephone Encounter (Signed)
Pt called states fever of around 101 off and on for 2 weeks, diarrhea is worse. About 17 episodes per day. Missed last infusion of remaciad that was due 11/23. States immodium and lomotil is not helping with diarrhea. Taking prednisone 53m 2 daily. Consult with Dr. RLaural Goldenand pt should go to ED. I called pt back and let her know Dr. ROlevia Perchesrecommendation is to [facility] and she states she will go.

## 2021-04-07 ENCOUNTER — Telehealth (INDEPENDENT_AMBULATORY_CARE_PROVIDER_SITE_OTHER): Payer: Self-pay

## 2021-04-07 ENCOUNTER — Telehealth (INDEPENDENT_AMBULATORY_CARE_PROVIDER_SITE_OTHER): Payer: Self-pay | Admitting: *Deleted

## 2021-04-07 NOTE — Telephone Encounter (Signed)
I called Short stay center spoke with Hoyle Sauer she placed the patient on the schedule to have the third dose of Remicad 04/15/2021 at 8:30 am she was advised to be there 15 minutes earlier. Female in the background repeated date and time of the appointment the also asked when patient Ct scan was scheduled I advised when this was and to be npo 4 hours prior.  I asked that she write down all this information so she did not forget she states she has the information and the female in the background whom was on speaker also aware. I also let Serena Colonel here in the office know that she may want to call the patient and go over her instructions for the CT scan as far as when patient will need to pick up the drink. Serena Colonel states she will call the patient to inform.

## 2021-04-07 NOTE — Telephone Encounter (Signed)
Is that insurance accepted by [facility] or [facility]? She used to go to [facility] in the past

## 2021-04-07 NOTE — Telephone Encounter (Signed)
Please advise, Dr. Laural Golden patient. The patient was prescribed Remicade. Patient had the first two doses during her hospitalization on 02/14/2021 and 02/21/2021. She was scheduled and told three different times about her appointment at [place] and final dose of induction that was scheduled at short stay at [facility] on 03/25/2021. She then no showed for this appointment. Called the office on 04/01/2021 with abdominal pain, and told to go to the ED.I had gotten insurance approval through 05/02/2021, for the last dose of the induction. Patient has not rescheduled this appointment and I need to get approval for the maintenance doses. Please advise do I just reschedule the third infusion?, as next week will be eight weeks from the last time she had a dose of Remicade. Should I wait to ask Dr.Rehman on his return? Please advise.

## 2021-04-07 NOTE — Telephone Encounter (Signed)
Yes please, let's try to get it as soon as possible as even though it is not ideal, she should get her induction doses and then every 8 weeks

## 2021-04-07 NOTE — Telephone Encounter (Signed)
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

## 2021-04-09 ENCOUNTER — Encounter (INDEPENDENT_AMBULATORY_CARE_PROVIDER_SITE_OTHER): Payer: Self-pay | Admitting: *Deleted

## 2021-04-09 NOTE — Telephone Encounter (Addendum)
I spoke to scheduler at [facility] and we scheduled patient for 07/30/21 at 1 pm with Dr Bradly Chris. I have spoken to patient and she wrote all info down. I also typed everything up and mailed to her.

## 2021-04-09 NOTE — Telephone Encounter (Signed)
Thanks so much. 

## 2021-04-14 NOTE — Telephone Encounter (Signed)
Per [name] patient called on 04/13/2021 and asked when she needed to be at Short stay [facility] for her Remicade infusion. [name] says she told her to be there on 04/15/2021 at 8 am. I called the patient and left a detailed message that she needed to keep the appointment at [facility] for 04/15/2021 at 8 am, advised to not be late as they will turn her away.

## 2021-04-15 ENCOUNTER — Encounter (HOSPITAL_COMMUNITY)
Admission: RE | Admit: 2021-04-15 | Discharge: 2021-04-15 | Disposition: A | Discharge status: Home / Self Care | Payer: 59 | Source: Ambulatory Visit | Attending: Internal Medicine | Admitting: Internal Medicine

## 2021-04-15 ENCOUNTER — Other Ambulatory Visit: Payer: Self-pay

## 2021-04-15 DIAGNOSIS — K5090 Crohn's disease, unspecified, without complications: Secondary | ICD-10-CM | POA: Insufficient documentation

## 2021-04-15 MED ORDER — SODIUM CHLORIDE 0.9 % IV SOLN
5.0000 mg/kg | Freq: Once | INTRAVENOUS | Status: AC
  Administered 2021-04-15: 09:00:00 300 mg via INTRAVENOUS
  Filled 2021-04-15: qty 30

## 2021-04-15 MED ORDER — SODIUM CHLORIDE 0.9 % IV SOLN: Freq: Once | INTRAVENOUS | Status: AC

## 2021-04-15 NOTE — Telephone Encounter (Signed)
Checked Epic patient did arrive for her infusion today 04/15/2021.

## 2021-04-15 NOTE — Telephone Encounter (Signed)
I sent (faxed 04/15/2021) a prior authorization to [employer] to get approval for the Infusions Q 8 weeks for her future infusions. Patient had 3rd and final loading dose on 04/15/2021 and due to start her Q 8 week treatment around 06/10/2021.

## 2021-04-16 NOTE — Telephone Encounter (Signed)
Patient has [employer] insurance and they are not participating in [employer] for 2023. Need to call patient and find out which insurance she plans on having for the year 2023.

## 2021-04-17 NOTE — Telephone Encounter (Signed)
I spoke with the patient and advised that [employer] was pulling out of the health market for 2023 and she would no longer have coverage with them after 05/02/2021. Patient states she did not know this. I advised she needed to find a new health insurance for the new year,and the sooner she found it the better, so we can move forward with getting her infusions approved through new insurance. I advised she needed to find a plan that will participate with [name] and will cover her Infliximab. Patient states understanding and says she will start today to find a new insurance. I advised to bring me new insurance information as soon as she gets it to prevent the delay of her medication.

## 2021-04-17 NOTE — Telephone Encounter (Signed)
Due for next infusion 06/10/2021 at Short stay [facility].

## 2021-04-28 ENCOUNTER — Other Ambulatory Visit (INDEPENDENT_AMBULATORY_CARE_PROVIDER_SITE_OTHER): Payer: Self-pay | Admitting: *Deleted

## 2021-04-28 ENCOUNTER — Ambulatory Visit (HOSPITAL_COMMUNITY): Admission: RE | Admit: 2021-04-28 | Payer: 59 | Source: Ambulatory Visit

## 2021-04-28 DIAGNOSIS — R109 Unspecified abdominal pain: Secondary | ICD-10-CM

## 2021-04-30 ENCOUNTER — Ambulatory Visit (HOSPITAL_COMMUNITY)
Admission: RE | Admit: 2021-04-30 | Discharge: 2021-04-30 | Disposition: A | Discharge status: Home / Self Care | Payer: 59 | Source: Ambulatory Visit | Attending: Internal Medicine | Admitting: Internal Medicine

## 2021-04-30 ENCOUNTER — Other Ambulatory Visit: Payer: Self-pay

## 2021-04-30 ENCOUNTER — Encounter (INDEPENDENT_AMBULATORY_CARE_PROVIDER_SITE_OTHER): Payer: Self-pay | Admitting: Internal Medicine

## 2021-04-30 ENCOUNTER — Ambulatory Visit (INDEPENDENT_AMBULATORY_CARE_PROVIDER_SITE_OTHER): Payer: 59 | Admitting: Internal Medicine

## 2021-04-30 VITALS — BP 118/78 | HR 130 | Temp 97.7°F | Ht 65.0 in | Wt 132.7 lb

## 2021-04-30 DIAGNOSIS — K50119 Crohn's disease of large intestine with unspecified complications: Secondary | ICD-10-CM

## 2021-04-30 DIAGNOSIS — K56699 Other intestinal obstruction unspecified as to partial versus complete obstruction: Secondary | ICD-10-CM | POA: Diagnosis present

## 2021-04-30 DIAGNOSIS — R112 Nausea with vomiting, unspecified: Secondary | ICD-10-CM | POA: Diagnosis not present

## 2021-04-30 LAB — POCT I-STAT CREATININE: Creatinine, Ser: 1 mg/dL (ref 0.44–1.00)

## 2021-04-30 MED ORDER — IOHEXOL 300 MG/ML  SOLN
100.0000 mL | Freq: Once | INTRAMUSCULAR | Status: AC | PRN
  Administered 2021-04-30: 09:00:00 100 mL via INTRAVENOUS

## 2021-04-30 MED ORDER — LOPERAMIDE HCL 2 MG PO CAPS: 2.0000 mg | ORAL_CAPSULE | Freq: Three times a day (TID) | ORAL | Status: DC

## 2021-04-30 MED ORDER — MESALAMINE ER 0.375 G PO CP24: 1500.0000 mg | ORAL_CAPSULE | Freq: Every day | ORAL | 5 refills | Status: DC

## 2021-04-30 NOTE — Progress Notes (Signed)
Presenting complaint;  Follow-up for inflammatory bowel disease/colitis and GERD.  Database and subjective:  Briefly patient is 50 year old Caucasian female who was diagnosed with ileal Crohn's disease in 2007 and again noted to have terminal ileitis and 2010 when she had colonoscopy.  She was treated for few weeks or months and then lost to follow-up.  She was admitted twice [facility].  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  She had colon full of fecal matter proximal to the stricture.  She was treated with IV steroids IV antibiotics and begun on infliximab. She had abdominal pelvic CT earlier today as planned.  Patient is here for scheduled visit accompanied by her boyfriend Jeneen Rinks.  She was last seen on 03/19/2021. She does not feel any better.  She vomits almost every day.  Her vomitus mainly is bile and fluid.  Every now and then she may throw up food.  She has been vomiting today since she ingested contrast for CT.  Her husband states that she had bacon she is sandwich which she did not vomit.  Heartburn is well controlled.  She denies hematemesis.  She is having anywhere from 17-18 stools per day.  She also has fecal seepage.  Stools were watery.  She is seen scant amount of blood.  She continues to experience abdominal pain across upper lower abdomen.  She had low-grade fever but 2 weeks ago but none recently.  Her husband states she thinks Mountain Dew all the time. She has lost another 13 pounds since her last visit.  Since her hospitalization she has lost close to 30 pounds. She tapered and finished prednisone 2 weeks ago when she really cannot tell that her symptoms have gotten worse. She is not interested in having colostomy. She received first infusion of infliximab on 02/14/2021, 2nd dose on 02/21/2021; third dose on 03/25/2021 and last dose was on 04/25/2021.  She is not having any side effects with this medication.  Current  Medications: Outpatient Encounter Medications as of 04/30/2021  Medication Sig   acetaminophen (TYLENOL) 325 MG tablet Take 2 tablets (650 mg total) by mouth every 6 (six) hours as needed for mild pain or fever (or Fever >/= 101).   albuterol (VENTOLIN HFA) 108 (90 Base) MCG/ACT inhaler Inhale 2 puffs into the lungs every 4 (four) hours as needed for wheezing or shortness of breath.   ALPRAZolam (XANAX) 1 MG tablet Take 1 mg by mouth 3 (three) times daily as needed for anxiety.   amitriptyline (ELAVIL) 75 MG tablet Take 75 mg by mouth at bedtime.   cetirizine (ZYRTEC) 10 MG tablet Take 10 mg by mouth daily.   cyclobenzaprine (FLEXERIL) 10 MG tablet Take 10 mg by mouth 3 (three) times daily as needed for muscle spasms.   DULoxetine (CYMBALTA) 30 MG capsule Take 30 mg by mouth daily.   folic acid (FOLVITE) 1 MG tablet Take 1 tablet (1 mg total) by mouth daily.   inFLIXimab (REMICADE IV) Inject into the vein.   levETIRAcetam (KEPPRA) 750 MG tablet Take 750 mg by mouth 2 (two) times daily.   loperamide (IMODIUM) 2 MG capsule Take 1 capsule (2 mg total) by mouth daily with breakfast.   nitroGLYCERIN (NITROSTAT) 0.4 MG SL tablet Place 1 tablet (0.4 mg total) under the tongue every 5 (five) minutes as needed for chest pain.   omeprazole (PRILOSEC) 40 MG capsule Take 40 mg by mouth daily.   ondansetron (ZOFRAN ODT) 8 MG disintegrating tablet  Take 1 tablet (8 mg total) every 8 (eight) hours as needed by mouth for nausea or vomiting.   potassium chloride SA (KLOR-CON) 20 MEQ tablet Take 1 tablet (20 mEq total) by mouth daily.   promethazine (PHENERGAN) 25 MG tablet Take 1 tablet (25 mg total) by mouth every 6 (six) hours as needed for nausea or vomiting.   RESTASIS 0.05 % ophthalmic emulsion Place 1 drop into both eyes 2 (two) times daily.   SPIRIVA RESPIMAT 2.5 MCG/ACT AERS Inhale 1 puff into the lungs daily as needed (shortness of breath).   No facility-administered encounter medications on file as of  04/30/2021.     Objective: Blood pressure 118/78, pulse (!) 130, temperature 97.7 F (36.5 C), temperature source Oral, height 5' 5"  (1.651 m), weight 132 lb 11.2 oz (60.2 kg). Patient is alert.  She is vomiting cloudy fluid which appears to be contrast. Conjunctiva is pink. Sclera is nonicteric Oropharyngeal mucosa is normal. No neck masses or thyromegaly noted. Cardiac exam with regular rhythm normal S1 and S2. No murmur or gallop noted. Lungs are clear to auscultation. Abdomen is full but not distended.  Bowel sounds are normal.  On palpation she has tenderness in left lower quadrant and hypogastric region as well as right upper quadrant without guarding.  Percussion note is very tympanitic.  No organomegaly or masses. No LE edema or clubbing noted.  Labs/studies Results:   CBC Latest Ref Rng & Units 03/10/2021 02/22/2021 02/19/2021  WBC 4.0 - 10.5 K/uL 8.4 9.4 9.8  Hemoglobin 12.0 - 15.0 g/dL 11.5(L) 10.9(L) 11.8(L)  Hematocrit 36.0 - 46.0 % 35.7(L) 35.2(L) 37.7  Platelets 150 - 400 K/uL 499(H) 293 337    CMP Latest Ref Rng & Units 04/30/2021 03/10/2021 02/22/2021  Glucose 70 - 99 mg/dL - 177(H) 118(H)  BUN 6 - 20 mg/dL - 8 13  Creatinine 0.44 - 1.00 mg/dL 1.00 0.77 0.92  Sodium 135 - 145 mmol/L - 136 138  Potassium 3.5 - 5.1 mmol/L - 2.9(L) 3.2(L)  Chloride 98 - 111 mmol/L - 101 102  CO2 22 - 32 mmol/L - 28 30  Calcium 8.9 - 10.3 mg/dL - 8.3(L) 9.3  Total Protein 6.5 - 8.1 g/dL - 6.0(L) -  Total Bilirubin 0.3 - 1.2 mg/dL - 0.5 -  Alkaline Phos 38 - 126 U/L - 102 -  AST 15 - 41 U/L - 35 -  ALT 0 - 44 U/L - 21 -    Hepatic Function Latest Ref Rng & Units 03/10/2021 02/10/2021 02/03/2021  Total Protein 6.5 - 8.1 g/dL 6.0(L) 5.8(L) 7.6  Albumin 3.5 - 5.0 g/dL 2.9(L) 3.0(L) 3.7  AST 15 - 41 U/L 35 28 41  ALT 0 - 44 U/L 21 22 29   Alk Phosphatase 38 - 126 U/L 102 73 121  Total Bilirubin 0.3 - 1.2 mg/dL 0.5 0.4 0.4  Bilirubin, Direct 0.0 - 0.2 mg/dL - - -    Summary of CT  findings from today. Images reviewed with patient and her friend and compared with prior studies.   IMPRESSION: 1. There is long segment wall thickening of the decompressed and relatively featureless mid transverse through sigmoid colon and rectum most conspicuously in the sigmoid and rectum. There is extensive perirectal fat stranding as well as presacral fluid and fat stranding. Findings are in keeping with inflammatory bowel disease and somewhat worsened in comparison to prior examination dated 02/03/2021. Underlying rectal mass would be very difficult to strictly exclude given this appearance. Correlate with colonoscopy.  2. There is no evidence of overt stricturing or obstipation at this time per exam indication of stricture. No evidence of other complication such as fistula or abscess. 3. Status post hysterectomy and cholecystectomy  Assessment:  #1.  Crohn's colitis.  Patient was diagnosed with Crohn's disease several years ago but had not been on any maintenance therapy or regular follow-up.  It is interesting to endoscopic distribution favors ulcerative colitis.  Whether or not she has Crohn's colitis or ulcerative colitis it really does not matter and is of academic importance only.  Patient remains with frequent stools.  Current CT does not show colonic distention or fecal matter proximal to diseased segment and stricture which was dilated about 9 weeks ago.  She remains on infliximab.  So far no compelling evidence that treatment is working.  We will try to control her diarrhea with loperamide. Patient finished prednisone taper 2 weeks ago and she really is not having any relapse. She may benefit from oral mesalamine. Will plan colonoscopy after next visit if she is able to tolerate the prep.  #2.  Weight loss secondary to above.  If weight loss continues will check fasting cortisol level.  #3.  Chronic GERD.  Patient remains on PPI.   Plan:  Patient will go to the lab for  CBC and comprehensive chemistry panel. Fecal calprotectin. New prescription given for ondansetron sublingual 8 mg 3 times daily as needed.  30 doses with 1 refill. Begin mesalamine 800 mg by mouth twice daily. Loperamide 2 mg by mouth 30 minutes before each meal.  Can drop dose to twice daily as needed. Infliximab and antibody levels prior to next infusion which would be in February 2023. Office visit in 4 weeks.

## 2021-04-30 NOTE — Patient Instructions (Signed)
Start with Imodium/loperamide 3 times a day.  Can drop dose to twice a day for constipation. Please try to cut back on intake of [facility] will consider protein shake or milkshake daily. Physician will call with results of blood and stool test when completed. Will plan to check infliximab drug and antibody level prior to next infusion.

## 2021-05-06 NOTE — Telephone Encounter (Signed)
I spoke with the patient she states she has reached out to a insurance company to get coverage,but no one has returned her call. I advised that she have someone to take her to the insurance company to get signed up for coverage for this new year of 2023. I advised I could not move forward with getting her medication approved unless she does this. I advised that if she has no insurance before 06/10/2021 when her next infusion is scheduled she will be responsible for the total cost of drug and administration. She states understanding. I asked that when she does get coverage she will need to bring me new information so I can check if needs authorization.She states understanding.

## 2021-05-06 NOTE — Telephone Encounter (Signed)
Called the patient home number left a message on voice mail asked that she please return call to the office.

## 2021-05-08 ENCOUNTER — Telehealth (INDEPENDENT_AMBULATORY_CARE_PROVIDER_SITE_OTHER): Payer: Self-pay | Admitting: *Deleted

## 2021-05-08 NOTE — Telephone Encounter (Signed)
Dr. Laural Golden wanted patient to have appt end of january

## 2021-05-11 ENCOUNTER — Telehealth (INDEPENDENT_AMBULATORY_CARE_PROVIDER_SITE_OTHER): Payer: Self-pay | Admitting: *Deleted

## 2021-05-11 NOTE — Telephone Encounter (Signed)
Patient calling to see when her next appt is. I read Dr. Olevia Perches note from visit on 04/30/21 and he wanted to see patient back in 4 weeks from that visit. ( Appt due around 05/31/21). Please call patient to schedule.

## 2021-05-11 NOTE — Telephone Encounter (Signed)
Per Cristy Folks here at the office patient called her today stating she was looking for new insurance and is possibly going to choose [employer]. She will call and bring new cards once she has made a determination.

## 2021-05-11 NOTE — Telephone Encounter (Signed)
Pt wants to change referral from [facility]. Has appt at [place] [facility] per patient with Dr. Bradly Chris.

## 2021-05-13 NOTE — Telephone Encounter (Signed)
Pt states her new insurance starts February 14th and she was not sure about that date. Her appt with [facility] is 2/7 and infusion for remicade is 2/8. She wants to rescheduled til insurance takes effects. I advised her to bring insurance card to [place] when she gets it because we will have to do authorization for infusion.

## 2021-05-13 NOTE — Telephone Encounter (Signed)
Patient called back and wants to keep appt at [facility]. New insurance will cover [place].

## 2021-05-13 NOTE — Telephone Encounter (Signed)
Per Cristy Folks here the patient has chosen [facility] as her new insurance and states the insurance is not effective until 06/16/2021. Patient next infusion is scheduled for 06/10/2021. Per Abigail Butts she will speak with Dr. Laural Golden to see if the infusion can be moved back until the insurance is effective.

## 2021-05-13 NOTE — Telephone Encounter (Signed)
First infusion 10/15, 2nd - 10/22 and 3rd on 12/14

## 2021-05-14 NOTE — Telephone Encounter (Signed)
Per dr Laural Golden - may move out appts til insurance takes effect. Pt states she will bring insurance card when she receives it in mail

## 2021-05-15 NOTE — Telephone Encounter (Signed)
[name]:   Patient called today to report she had told us the wrong insurance it was not [facility], it was [employer] and they are not in network with [employer], but are in Network with [place] and [place].

## 2021-05-15 NOTE — Telephone Encounter (Signed)
I can't do anything until we get copy of her new card at which time I can do referral to [facility] or [place]

## 2021-05-15 NOTE — Telephone Encounter (Signed)
Patient called today to report she had told us the wrong insurance it was not [facility], it was [employer] and they are not in network with [employer], but are in Network with [place] and [employer].

## 2021-05-20 ENCOUNTER — Telehealth (INDEPENDENT_AMBULATORY_CARE_PROVIDER_SITE_OTHER): Payer: Self-pay | Admitting: *Deleted

## 2021-05-20 NOTE — Telephone Encounter (Signed)
Patient called to see when her appts are. She has ov with [facility] on 2/7 and infusion on 2/8. She now states her new insurance will be active 2/1. Still waiting for [place] card in mail but states she got a medicaid card in mail and not sure why. I advised her to call number on card if she did not apply for it and let us know as soon as she gets new insurance so we can do auth for infusion. She verbalized understanding.

## 2021-05-20 NOTE — Telephone Encounter (Signed)
Patient L/m for someone to call her, didn't give reason for call  Phone# [phone]

## 2021-06-03 ENCOUNTER — Telehealth (INDEPENDENT_AMBULATORY_CARE_PROVIDER_SITE_OTHER): Payer: Self-pay | Admitting: *Deleted

## 2021-06-03 NOTE — Telephone Encounter (Signed)
Pt states she will have friend bring by her new [place] card today

## 2021-06-03 NOTE — Telephone Encounter (Signed)
I called patient to see if she has new insurance card because she is due for remicaid infusion 2/8 that needs approval. She states she does have her new card and will have her friend bring it to [place] today.   She wanted to ask dr Laural Golden if there is anything else she can do for diarrhea and vomiting. States it never got better from leaving hospital 11/30.   Takes imodium 62m tid and lomotil prn. But used 2 yesterday and med are not helping.  Takes zofran 852modt 1 -2 every day and phenergan 25 about twice per week.

## 2021-06-05 NOTE — Telephone Encounter (Signed)
Per dr Laural Golden. Pt needs to have remicaid infusion a couple more times and then can do TCS. Pt has infusion scheduled next Wednesday. Pt verbalized understanding.

## 2021-06-05 NOTE — Telephone Encounter (Signed)
Pt brought in new card yesterday. [employer]

## 2021-06-05 NOTE — Telephone Encounter (Signed)
Pt would like a reminder call on Monday for her appt on Tuesday.

## 2021-06-08 NOTE — Telephone Encounter (Signed)
I also checked and her PA is still pending for remicaid infusion that is set up for 2/8. Will check again tomorrow to see if we need to reschedule or if PA got approved.

## 2021-06-08 NOTE — Telephone Encounter (Signed)
I called and reminded patient of her appt tomorrow at 11:45

## 2021-06-09 ENCOUNTER — Other Ambulatory Visit: Payer: Self-pay

## 2021-06-09 ENCOUNTER — Encounter (INDEPENDENT_AMBULATORY_CARE_PROVIDER_SITE_OTHER): Payer: Self-pay | Admitting: Gastroenterology

## 2021-06-09 ENCOUNTER — Ambulatory Visit (INDEPENDENT_AMBULATORY_CARE_PROVIDER_SITE_OTHER): Payer: Self-pay | Admitting: Gastroenterology

## 2021-06-09 VITALS — BP 98/64 | HR 149 | Temp 97.7°F | Ht 65.0 in | Wt 128.0 lb

## 2021-06-09 DIAGNOSIS — R21 Rash and other nonspecific skin eruption: Secondary | ICD-10-CM

## 2021-06-09 DIAGNOSIS — K50119 Crohn's disease of large intestine with unspecified complications: Secondary | ICD-10-CM

## 2021-06-09 DIAGNOSIS — Z79620 Long term (current) use of immunosuppressive biologic: Secondary | ICD-10-CM

## 2021-06-09 LAB — CBC
HCT: 39.5 % (ref 35.0–45.0)
Hemoglobin: 13.2 g/dL (ref 11.7–15.5)
MCH: 30.6 pg (ref 27.0–33.0)
MCHC: 33.4 g/dL (ref 32.0–36.0)
MCV: 91.4 fL (ref 80.0–100.0)
MPV: 10 fL (ref 7.5–12.5)
Platelets: 846 10*3/uL — ABNORMAL HIGH (ref 140–400)
RBC: 4.32 10*6/uL (ref 3.80–5.10)
RDW: 14.7 % (ref 11.0–15.0)
WBC: 10.5 10*3/uL (ref 3.8–10.8)

## 2021-06-09 LAB — COMPREHENSIVE METABOLIC PANEL
AG Ratio: 0.8 (calc) — ABNORMAL LOW (ref 1.0–2.5)
ALT: 16 U/L (ref 6–29)
AST: 18 U/L (ref 10–35)
Albumin: 2.8 g/dL — ABNORMAL LOW (ref 3.6–5.1)
Alkaline phosphatase (APISO): 175 U/L — ABNORMAL HIGH (ref 37–153)
BUN: 12 mg/dL (ref 7–25)
CO2: 24 mmol/L (ref 20–32)
Calcium: 8.8 mg/dL (ref 8.6–10.4)
Chloride: 97 mmol/L — ABNORMAL LOW (ref 98–110)
Creat: 0.68 mg/dL (ref 0.50–1.03)
Globulin: 3.5 g/dL (calc) (ref 1.9–3.7)
Glucose, Bld: 115 mg/dL — ABNORMAL HIGH (ref 65–99)
Potassium: 3.8 mmol/L (ref 3.5–5.3)
Sodium: 134 mmol/L — ABNORMAL LOW (ref 135–146)
Total Bilirubin: 0.3 mg/dL (ref 0.2–1.2)
Total Protein: 6.3 g/dL (ref 6.1–8.1)

## 2021-06-09 MED ORDER — METHYLPREDNISOLONE 4 MG PO TBPK: ORAL_TABLET | ORAL | 0 refills | Status: DC

## 2021-06-09 NOTE — Telephone Encounter (Signed)
Patient aware she was approved by her new insurance [employer] from 06/10/2021-06/10/2022 for Remicade Case # [id number]. She is aware to kepp her appt tomorrow with [facility] stay center. New orders for Remicade and orders for blood work sent to [facility] center.

## 2021-06-09 NOTE — Telephone Encounter (Signed)
Referral and notes faxed to [facility], they will contact patient with apt

## 2021-06-09 NOTE — Telephone Encounter (Signed)
[name] at [facility] aware of new auth and lab orders to be done at 06/10/2021 appt. She is aware we have faxed a copy of the approval, new order and lab orders. Patient aware to keep appt [facility] asked her to be there a few minutes earlier for her appt.

## 2021-06-09 NOTE — Patient Instructions (Addendum)
Please complete labs CBC, CMP and Fecal calprotectin that were ordered at previous visit We are working to get your next remicade infusion due tomorrow approved by insurance Please start Apriso 1.5 g (4 tabs) daily Start loperamide 46m before meals Need to have remicade antibodies drawn PRIOR to next remicade infusion  As far as the rash, I am sending a medrol dose pack to help with this, you can take benadryl as needed to help with itching as well.   Follow up 1 month

## 2021-06-09 NOTE — Progress Notes (Signed)
Referring Provider: Teodoro Kil, PA-C Primary Care Physician:  Teodoro Kil, PA-C Primary GI Physician: Laural Golden  Chief Complaint  Patient presents with   Follow-up    Patient here today per Dr.Rehman. Patient is itching and has a rash on her entire body. History of crohns and is scheduled for infusion tomorrow pending insurance approval. She states she has 20 bm's per day that are loose. Needs mesalamine called in if going to continue it.    HPI:   [name] is a 51 y.o. female with past medical history of anxiety, COPD, Crohn's, GERD, HTN, IBS, PTSD, Type II DM.  Patient presenting today for ileal Crohn's disease, diagnosed in 2007.   Last seen in clinic Apr 30, 2021 with reported daily vomiting and anywhere from 17-18 stools per day as well as fecal seepage with watery stools and scant amount of blood mixed in. She continued to endorse abdominal pain. Weight was down 13 pounds since previous visit in Nov 2022. Had recently finished prednisone taper 2 weeks prior to last visit without much change in her symptoms. Colostomy was discussed with patient however she was not interested in proceeding with this.  Had first infusion of Remicade on 02/14/21, 2nd dose 02/21/21, 3rd dose 03/25/21 and last dose was 04/15/21. She was without side effects thus far. CBC, CMP and fecal calprotectin ordered but not completed. She was also started on mesalamine 1.43m daily and loperamide 27mbefore each meal. Recommend Infliximab and antibody levels prior to her next infusion Feb 2023.   Today, patient states that she is having up to 15-20 BMs per day. She also has diffuse, red, pruritic rash over entire body. She states that rash started appearing after the first infusion but has gotten worse. She denies any sob, swelling to her tongue or throat. She wonders if her symptoms have been worse since starting Remicade. She states that since her flex sig, she has had rectal seepage and fecal accidents. She  reports occasionally she has bright red blood mixed in with stools and the last night she noticed a "chunk" of red colored material. Otherwise stools have been very watery. She is unsure if she started the mesalamine after her last visit and did not start the loperamide. She has lost 4 pounds since last visit in December. Appetite is not good. She continues with daily nausea, abdominal pain and vomiting. She feels very poor. She reports that her face has been swollen since hospitalization in Oct 2022 as well.   Flex sig: Feb 19, 2021 - Diffuse severe inflammation was found in the rectum and in the sigmoid colon secondary to colitis. (Chronic active colitis) - Stricture in the sigmoid colon. Dilated.  Past Medical History:  Diagnosis Date   Anxiety    Asthma    Chronic low back pain    Collagen vascular disease (HCC)    COPD (chronic obstructive pulmonary disease) (HCC)    Crohn's disease (HCC)    GERD (gastroesophageal reflux disease)    EGD 01/2007 by Dr.Rourke small hiatal hernia s/p 56 french maloney    History of head injury    Hypertension    IBS (irritable bowel syndrome)    PSVT (paroxysmal supraventricular tachycardia) (HCC)    PTSD (post-traumatic stress disorder)    Recurrent chest pain    Seizure disorder ([place]   Type 2 diabetes mellitus ([facility]    Past Surgical History:  Procedure Laterality Date   ABDOMINAL HYSTERECTOMY     with right salpingo  oophorectomy 2005   APPENDECTOMY  2006   BALLOON DILATION N/A 02/19/2021   Procedure: BALLOON DILATION;  Surgeon: Eloise Harman, DO;  [facility];  Service: Endoscopy;  Laterality: N/A;  Sigmoid colon stricture   BIOPSY  02/06/2021   Procedure: BIOPSY;  Surgeon: Eloise Harman, DO;  [facility];  Service: Endoscopy;;   BIOPSY  02/19/2021   Procedure: BIOPSY;  Surgeon: Eloise Harman, DO;  [facility];  Service: Endoscopy;;   CESAREAN SECTION     1996   CHOLECYSTECTOMY      COLONOSCOPY  2010   Dr. Gala Romney; negative except for hemorrhoids   ESOPHAGEAL DILATION N/A 10/25/2014   Procedure: ESOPHAGEAL DILATION;  Surgeon: Rogene Houston, MD;  [facility];  Service: Endoscopy;  Laterality: N/A;   ESOPHAGOGASTRODUODENOSCOPY N/A 10/25/2014   Procedure: ESOPHAGOGASTRODUODENOSCOPY (EGD);  Surgeon: Rogene Houston, MD;  [facility];  Service: Endoscopy;  Laterality: N/A;  [place] N/A 02/06/2021   Procedure: FLEXIBLE SIGMOIDOSCOPY;  Surgeon: Eloise Harman, DO;  [facility];  Service: Endoscopy;  Laterality: N/A;   FLEXIBLE SIGMOIDOSCOPY N/A 02/19/2021   Procedure: FLEXIBLE SIGMOIDOSCOPY;  Surgeon: Eloise Harman, DO;  [facility];  Service: Endoscopy;  Laterality: N/A;   OOPHORECTOMY     left for torsion and ovarian fibroma; uterine myoma resected 1995    Current Outpatient Medications  Medication Sig Dispense Refill   acetaminophen (TYLENOL) 325 MG tablet Take 2 tablets (650 mg total) by mouth every 6 (six) hours as needed for mild pain or fever (or Fever >/= 101). 12 tablet 0   albuterol (VENTOLIN HFA) 108 (90 Base) MCG/ACT inhaler Inhale 2 puffs into the lungs every 4 (four) hours as needed for wheezing or shortness of breath. 18 g 1   ALPRAZolam (XANAX) 1 MG tablet Take 1 mg by mouth 3 (three) times daily as needed for anxiety.     amitriptyline (ELAVIL) 75 MG tablet Take 75 mg by mouth at bedtime.     cetirizine (ZYRTEC) 10 MG tablet Take 10 mg by mouth daily.     cyclobenzaprine (FLEXERIL) 10 MG tablet Take 10 mg by mouth 3 (three) times daily as needed for muscle spasms.     DULoxetine (CYMBALTA) 30 MG capsule Take 30 mg by mouth daily.     inFLIXimab (REMICADE IV) Inject into the vein.     levETIRAcetam (KEPPRA) 750 MG tablet Take 750 mg by mouth 2 (two) times daily.     loperamide (IMODIUM) 2 MG capsule Take 1 capsule (2 mg total) by mouth 3 (three) times daily before meals. 30 capsule     nitroGLYCERIN (NITROSTAT) 0.4 MG SL tablet Place 1 tablet (0.4 mg total) under the tongue every 5 (five) minutes as needed for chest pain. 100 tablet 0   omeprazole (PRILOSEC) 40 MG capsule Take 40 mg by mouth daily.     ondansetron (ZOFRAN ODT) 8 MG disintegrating tablet Take 1 tablet (8 mg total) every 8 (eight) hours as needed by mouth for nausea or vomiting. 15 tablet 0   promethazine (PHENERGAN) 25 MG tablet Take 1 tablet (25 mg total) by mouth every 6 (six) hours as needed for nausea or vomiting. 12 tablet 0   RESTASIS 0.05 % ophthalmic emulsion Place 1 drop into both eyes 2 (two) times daily.     SPIRIVA RESPIMAT 2.5 MCG/ACT AERS Inhale 1 puff into the lungs daily as needed (  shortness of breath). 4 g 1   folic acid (FOLVITE) 1 MG tablet Take 1 tablet (1 mg total) by mouth daily. (Patient not taking: Reported on 06/09/2021) 30 tablet 2   mesalamine (APRISO) 0.375 g 24 hr capsule Take 4 capsules (1.5 g total) by mouth daily. (Patient not taking: Reported on 06/09/2021) 120 capsule 5   potassium chloride SA (KLOR-CON) 20 MEQ tablet Take 1 tablet (20 mEq total) by mouth daily. (Patient not taking: Reported on 06/09/2021) 5 tablet 0   No current facility-administered medications for this visit.    Allergies as of 06/09/2021 - Review Complete 06/09/2021  Allergen Reaction Noted   Codeine Shortness Of Breath, Swelling, and Rash    Dilaudid [hydromorphone hcl] Shortness Of Breath and Swelling 12/26/2012   Hydrocodone Shortness Of Breath, Swelling, and Rash 12/26/2012   Morphine and related Shortness Of Breath, Swelling, and Rash 12/26/2012   Oxycodone Shortness Of Breath, Swelling, and Rash 12/26/2012   Penicillins Shortness Of Breath, Swelling, and Other (See Comments)    Acetaminophen  12/26/2012   Ativan [lorazepam] Other (See Comments) 12/20/2016   Strawberry extract Swelling 07/22/2011   Watermelon concentrate Swelling 07/22/2011   Benadryl [diphenhydramine hcl] Palpitations 09/23/2011    Latex Rash     Family History  Problem Relation Age of Onset   Cancer Mother    Heart failure Mother    Hypertension Mother    Heart attack Mother    Heart attack Father 20   Cancer Father 32   Heart failure Father    Diabetes Father    Crohn's disease Cousin    Social History   Socioeconomic History   Marital status: Divorced    Spouse name: Not on file   Number of children: Not on file   Years of education: Not on file   Highest education level: Not on file  Occupational History   Not on file  Tobacco Use   Smoking status: Some Days    Packs/day: 0.50    Years: 20.00    Pack years: 10.00    Types: Cigarettes    Start date: 09/26/1984   Smokeless tobacco: Never  Vaping Use   Vaping Use: Never used  Substance and Sexual Activity   Alcohol use: No    Alcohol/week: 0.0 standard drinks   Drug use: Not Currently   Sexual activity: Yes    Birth control/protection: Surgical  Other Topics Concern   Not on file  Social History Narrative   Not on file   Social Determinants of Health   Financial Resource Strain: Not on file  Food Insecurity: Not on file  Transportation Needs: Not on file  Physical Activity: Not on file  Stress: Not on file  Social Connections: Not on file   Review of systems General: negative for malaise, night sweats, fever, chills, +weight loss Neck: Negative for lumps, goiter, pain and significant neck swelling Resp: Negative for cough, wheezing, dyspnea at rest CV: Negative for chest pain, leg swelling, palpitations, orthopnea GI: denies melena, constipation, dysphagia, odyonophagia, early satiety or unintentional weight loss. +nausea +vomiting +abdominal pain +rectal bleeding  MSK: Negative for joint pain or swelling, back pain, and muscle pain. Derm: +red, pruritic rash to limbs and torso Psych: Denies depression, anxiety, memory loss, confusion. No homicidal or suicidal ideation.  Heme: Negative for prolonged bleeding, bruising easily, and  swollen nodes. Endocrine: Negative for cold or heat intolerance, polyuria, polydipsia and goiter. Neuro: negative for tremor, gait imbalance, syncope and seizures. The remainder of  the review of systems is noncontributory.  Physical Exam: BP 98/64 (BP Location: Left Arm, Patient Position: Sitting, Cuff Size: Small)    Pulse (!) 149    Temp 97.7 F (36.5 C) (Oral)    Ht 5' 5"  (1.651 m)    Wt 128 lb (58.1 kg)    BMI 21.30 kg/m  General:   Alert and oriented. No distress noted. Pleasant and cooperative.  Head:  Normocephalic and atraumatic. Eyes:  Conjuctiva clear without scleral icterus. Mouth:  Oral mucosa pink and moist. Good dentition. No lesions. Heart: Normal rate and rhythm, s1 and s2 heart sounds present.  Lungs: Clear lung sounds in all lobes. Respirations equal and unlabored. Abdomen:  +BS, soft, and non-distended. Diffusely tender to mild palpation. No rebound or guarding. No HSM or masses noted. Derm: diffuse red, pruritic rash Msk:  Symmetrical without gross deformities. Normal posture. Extremities:  Without edema. Neurologic:  Alert and  oriented x4 Psych:  Alert and cooperative. Normal mood and affect.  Invalid input(s): 6 MONTHS   ASSESSMENT: Blayke Pinera is a 51 y.o. female presenting today for 4 week follow up of Crohn's disease.  Patient started Remicade in October 2022. She is due for next infusion tomorrow, however, due to pending approval by insurance, unfortunately, it is possible this will be delayed. She is continuing to have 15-20 liquid BMs per day, sometimes blood streaked with ongoing severe abdominal pain, nausea, and vomiting. She continues to lose weight and is down 4 pounds since her last visit at the end of December. It would be important for her to have infusion tomorrow so as not to have a gap in her therapy as this will delay improvement in her symptoms/effectiveness of medication. As far as Apriso that was prescribed at last visit, she should start this,  taking 1.5g daily as well as use loperamide 13m BID before meals to help slow diarrhea. She can use zofran, given to her previously Q8H as needed for nausea and vomiting. We will proceed with CBC, CMP and Fecal calprotectin today as these were not completed at last visit. She has diffuse, pruritic rash across her entire body, this is likely a secondary side effect of Remicade, however, there has been no anaphylaxis with infusions so we will proceed at this time. I am sending medrol dose pack to help with her rash, she can also take benadryl as needed for the itching in the meantime (1-2 tablets Q6H).   PLAN:  Proceed with next remicade infusion, infliximab/antibody labs to be drawn PRIOR to infusion 2. Start Apriso 15064mdaily 3. Loperamide 38m19mID before meals 4. Medrol dose pack for rash, can take benadryl 1-2 tabs Q6H PRN for itching 5. Complete CBC, CMP, fecal calprotectin  6. Continue to stay well hydrated, alternating water and low sugar electrolyte drink 7. Zofran Q8H as needed for n/v   Follow Up: 4 weeks  Antony Sian L. CarAlver SorrowSN, APRN, AGNP-C Adult-Gerontology Nurse Practitioner [facility]

## 2021-06-10 ENCOUNTER — Encounter (HOSPITAL_COMMUNITY)
Admission: RE | Admit: 2021-06-10 | Discharge: 2021-06-10 | Disposition: A | Discharge status: Home / Self Care | Payer: 59 | Source: Ambulatory Visit | Attending: Internal Medicine | Admitting: Internal Medicine

## 2021-06-10 ENCOUNTER — Encounter (HOSPITAL_COMMUNITY): Payer: Self-pay

## 2021-06-10 DIAGNOSIS — Z5181 Encounter for therapeutic drug level monitoring: Secondary | ICD-10-CM | POA: Insufficient documentation

## 2021-06-10 DIAGNOSIS — Z79620 Long term (current) use of immunosuppressive biologic: Secondary | ICD-10-CM | POA: Diagnosis not present

## 2021-06-10 DIAGNOSIS — K50119 Crohn's disease of large intestine with unspecified complications: Secondary | ICD-10-CM | POA: Insufficient documentation

## 2021-06-10 MED ORDER — SODIUM CHLORIDE 0.9 % IV SOLN
Freq: Once | INTRAVENOUS | Status: AC
  Administered 2021-06-10: 250 mL via INTRAVENOUS

## 2021-06-10 MED ORDER — SODIUM CHLORIDE 0.9 % IV SOLN
300.0000 mg | Freq: Once | INTRAVENOUS | Status: AC
  Administered 2021-06-10: 300 mg via INTRAVENOUS
  Filled 2021-06-10: qty 30

## 2021-06-10 NOTE — Telephone Encounter (Signed)
Patient did showed fro her appt for Remicade 06/10/2021 at [facility] and lab was drawn per Abigail Butts Lpn here at [place].

## 2021-06-11 NOTE — Telephone Encounter (Signed)
Remicaid was approved. Pt had infusion on 2/8

## 2021-06-16 ENCOUNTER — Encounter (HOSPITAL_COMMUNITY): Payer: 59

## 2021-06-16 DIAGNOSIS — K50119 Crohn's disease of large intestine with unspecified complications: Secondary | ICD-10-CM | POA: Diagnosis not present

## 2021-06-16 DIAGNOSIS — R112 Nausea with vomiting, unspecified: Secondary | ICD-10-CM | POA: Diagnosis not present

## 2021-06-17 ENCOUNTER — Encounter (INDEPENDENT_AMBULATORY_CARE_PROVIDER_SITE_OTHER): Payer: Self-pay

## 2021-06-17 ENCOUNTER — Other Ambulatory Visit (INDEPENDENT_AMBULATORY_CARE_PROVIDER_SITE_OTHER): Payer: Self-pay

## 2021-06-17 ENCOUNTER — Telehealth (INDEPENDENT_AMBULATORY_CARE_PROVIDER_SITE_OTHER): Payer: Self-pay

## 2021-06-17 DIAGNOSIS — K50119 Crohn's disease of large intestine with unspecified complications: Secondary | ICD-10-CM

## 2021-06-17 MED ORDER — PEG 3350-KCL-NA BICARB-NACL 420 G PO SOLR: 4000.0000 mL | ORAL | 0 refills | Status: DC

## 2021-06-17 NOTE — Telephone Encounter (Signed)
Neko Mcgeehan Ann Elaina Cara, CMA  ?

## 2021-06-18 ENCOUNTER — Encounter (INDEPENDENT_AMBULATORY_CARE_PROVIDER_SITE_OTHER): Payer: Self-pay

## 2021-06-19 LAB — INFLIXIMAB (IFX) CONC+ IFX AB
Anti-Infliximab Antibody: 61 ng/mL
Infliximab Drug Level: <0.4 ug/mL

## 2021-06-21 DIAGNOSIS — D84821 Immunodeficiency due to drugs: Secondary | ICD-10-CM | POA: Insufficient documentation

## 2021-06-22 LAB — CALPROTECTIN: Calprotectin: 1230 mcg/g — ABNORMAL HIGH

## 2021-06-24 ENCOUNTER — Telehealth (INDEPENDENT_AMBULATORY_CARE_PROVIDER_SITE_OTHER): Payer: Self-pay | Admitting: *Deleted

## 2021-06-24 NOTE — Telephone Encounter (Signed)
Pt called. States she has ran out of triamcinolone cream her pcp gave her for the rash that started after remicaid infusion. She was given a medrol dose pack on 2/7 but states they did not help at all. Triamcinolone cream has refills but because she has been using more than prescribed bid. Insurance will not pay until 2/27. Pt wants to know if there is another cream that she can use.

## 2021-06-24 NOTE — Telephone Encounter (Signed)
Called and discussed with pt. Pt verbalized understanding

## 2021-06-26 NOTE — Progress Notes (Signed)
Form was signed yesterday ( 06/25/21) and I faxed on 06/25/21. Await response.

## 2021-07-07 ENCOUNTER — Other Ambulatory Visit (INDEPENDENT_AMBULATORY_CARE_PROVIDER_SITE_OTHER): Payer: Self-pay | Admitting: *Deleted

## 2021-07-07 ENCOUNTER — Telehealth (INDEPENDENT_AMBULATORY_CARE_PROVIDER_SITE_OTHER): Payer: Self-pay | Admitting: *Deleted

## 2021-07-07 MED ORDER — HUMIRA (2 SYRINGE) 40 MG/0.4ML ~~LOC~~ PSKT: PREFILLED_SYRINGE | SUBCUTANEOUS | 0 refills | Status: DC

## 2021-07-07 NOTE — Telephone Encounter (Signed)
Called pt to let her know that humira was approved started dose approved 07/03/21 -07/04/22 and maint dose approved 08/01/21-07/04/22. Pt called and notified to call us when she gets injection and we can set her nurse visit for first injection.  ?

## 2021-07-08 NOTE — Telephone Encounter (Signed)
Patient called and states humira is suppose to be delivered tomorrow afternoon. I advised her to put in refrigerator when she gets it and give [place] a call so we can set up appt when a provider is here to give her first injection.  ?

## 2021-07-08 NOTE — Patient Instructions (Signed)
? ? ? ? ? ? [name] ? 07/08/2021  ?  ? @PREFPERIOPPHARMACY @ ? ? Your procedure is scheduled on  07/15/2021. ? ? Report to [name] at  1310 (1:10) P.M. ? ? Call this number if you have problems the morning of surgery: ? [phone] ? ? Remember: ? Follow the diet and prep instructions given to you by the office. ? ?  Use your inhalers before you come and bring your rescue inhalers with you. ? ?   DO NOT take any medications for diabetes the morning of your procedure. ?  ? ? Take these medicines the morning of surgery with A SIP OF WATER  ? ?          xanax(if needed), zyrtec, flexeril(if needed), cymbalta, keppra, prilosec, zofran (if needed). ?  ? ? Do not wear jewelry, make-up or nail polish. ? Do not wear lotions, powders, or perfumes, or deodorant. ? Do not shave 48 hours prior to surgery.  Men may shave face and neck. ? Do not bring valuables to the hospital. ? [place] is not responsible for any belongings or valuables. ? ?Contacts, dentures or bridgework may not be worn into surgery.  Leave your suitcase in the car.  After surgery it may be brought to your room. ? ?For patients admitted to the hospital, discharge time will be determined by your treatment team. ? ?Patients discharged the day of surgery will not be allowed to drive home and must have someone with them for 24 hours.  ? ? ?Special instructions:   DO NOT smoke tobacco or vape for 24 hours before your procedure. ? ?Please read over the following fact sheets that you were given. ?Anesthesia Post-op Instructions and Care and Recovery After Surgery ?  ? ? ? Colonoscopy, Adult, Care After ?This sheet gives you information about how to care for yourself after your procedure. Your health care provider may also give you more specific instructions. If you have problems or questions, contact your health care provider. ?What can I expect after the procedure? ?After the procedure, it is common to have: ?A small amount of blood in your stool for 24  hours after the procedure. ?Some gas. ?Mild cramping or bloating of your abdomen. ?Follow these instructions at home: ?Eating and drinking ? ?Drink enough fluid to keep your urine pale yellow. ?Follow instructions from your health care provider about eating or drinking restrictions. ?Resume your normal diet as instructed by your health care provider. Avoid heavy or fried foods that are hard to digest. ?Activity ?Rest as told by your health care provider. ?Avoid sitting for a long time without moving. Get up to take short walks every 1-2 hours. This is important to improve blood flow and breathing. Ask for help if you feel weak or unsteady. ?Return to your normal activities as told by your health care provider. Ask your health care provider what activities are safe for you. ?Managing cramping and bloating ? ?Try walking around when you have cramps or feel bloated. ?Apply heat to your abdomen as told by your health care provider. Use the heat source that your health care provider recommends, such as a moist heat pack or a heating pad. ?Place a towel between your skin and the heat source. ?Leave the heat on for 20-30 minutes. ?Remove the heat if your skin turns bright red. This is especially important if you are unable to feel pain, heat, or cold. You may have a greater risk of getting burned. ?General  instructions ?If you were given a sedative during the procedure, it can affect you for several hours. Do not drive or operate machinery until your health care provider says that it is safe. ?For the first 24 hours after the procedure: ?Do not sign important documents. ?Do not drink alcohol. ?Do your regular daily activities at a slower pace than normal. ?Eat soft foods that are easy to digest. ?Take over-the-counter and prescription medicines only as told by your health care provider. ?Keep all follow-up visits as told by your health care provider. This is important. ?Contact a health care provider if: ?You have blood in  your stool 2-3 days after the procedure. ?Get help right away if you have: ?More than a small spotting of blood in your stool. ?Large blood clots in your stool. ?Swelling of your abdomen. ?Nausea or vomiting. ?A fever. ?Increasing pain in your abdomen that is not relieved with medicine. ?Summary ?After the procedure, it is common to have a small amount of blood in your stool. You may also have mild cramping and bloating of your abdomen. ?If you were given a sedative during the procedure, it can affect you for several hours. Do not drive or operate machinery until your health care provider says that it is safe. ?Get help right away if you have a lot of blood in your stool, nausea or vomiting, a fever, or increased pain in your abdomen. ?This information is not intended to replace advice given to you by your health care provider. Make sure you discuss any questions you have with your health care provider. ?Document Revised: 02/23/2019 Document Reviewed: 11/13/2018 ?Elsevier Patient Education ? [place]. ?Monitored Anesthesia Care, Care After ?This sheet gives you information about how to care for yourself after your procedure. Your health care provider may also give you more specific instructions. If you have problems or questions, contact your health care provider. ?What can I expect after the procedure? ?After the procedure, it is common to have: ?Tiredness. ?Forgetfulness about what happened after the procedure. ?Impaired judgment for important decisions. ?Nausea or vomiting. ?Some difficulty with balance. ?Follow these instructions at home: ?For the time period you were told by your health care provider: ?  ?Rest as needed. ?Do not participate in activities where you could fall or become injured. ?Do not drive or use machinery. ?Do not drink alcohol. ?Do not take sleeping pills or medicines that cause drowsiness. ?Do not make important decisions or sign legal documents. ?Do not take care of children on  your own. ?Eating and drinking ?Follow the diet that is recommended by your health care provider. ?Drink enough fluid to keep your urine pale yellow. ?If you vomit: ?Drink water, juice, or soup when you can drink without vomiting. ?Make sure you have little or no nausea before eating solid foods. ?General instructions ?Have a responsible adult stay with you for the time you are told. It is important to have someone help care for you until you are awake and alert. ?Take over-the-counter and prescription medicines only as told by your health care provider. ?If you have sleep apnea, surgery and certain medicines can increase your risk for breathing problems. Follow instructions from your health care provider about wearing your sleep device: ?Anytime you are sleeping, including during daytime naps. ?While taking prescription pain medicines, sleeping medicines, or medicines that make you drowsy. ?Avoid smoking. ?Keep all follow-up visits as told by your health care provider. This is important. ?Contact a health care provider if: ?You keep feeling  nauseous or you keep vomiting. ?You feel light-headed. ?You are still sleepy or having trouble with balance after 24 hours. ?You develop a rash. ?You have a fever. ?You have redness or swelling around the IV site. ?Get help right away if: ?You have trouble breathing. ?You have new-onset confusion at home. ?Summary ?For several hours after your procedure, you may feel tired. You may also be forgetful and have poor judgment. ?Have a responsible adult stay with you for the time you are told. It is important to have someone help care for you until you are awake and alert. ?Rest as told. Do not drive or operate machinery. Do not drink alcohol or take sleeping pills. ?Get help right away if you have trouble breathing, or if you suddenly become confused. ?This information is not intended to replace advice given to you by your health care provider. Make sure you discuss any questions  you have with your health care provider. ?Document Revised: 01/03/2020 Document Reviewed: 03/22/2019 ?Elsevier Patient Education ? [place]. ? ?

## 2021-07-10 NOTE — Telephone Encounter (Signed)
Patient received the med today and has appt for a nurse visit on Monday July 13, 2021 for first injection.  ?

## 2021-07-13 ENCOUNTER — Encounter (HOSPITAL_COMMUNITY)
Admission: RE | Admit: 2021-07-13 | Discharge: 2021-07-13 | Disposition: A | Discharge status: Home / Self Care | Payer: 59 | Source: Ambulatory Visit | Attending: Internal Medicine | Admitting: Internal Medicine

## 2021-07-13 ENCOUNTER — Other Ambulatory Visit: Payer: Self-pay

## 2021-07-13 ENCOUNTER — Ambulatory Visit (INDEPENDENT_AMBULATORY_CARE_PROVIDER_SITE_OTHER): Payer: 59 | Admitting: Gastroenterology

## 2021-07-13 ENCOUNTER — Encounter (INDEPENDENT_AMBULATORY_CARE_PROVIDER_SITE_OTHER): Payer: Self-pay

## 2021-07-13 ENCOUNTER — Encounter (HOSPITAL_COMMUNITY): Payer: Self-pay

## 2021-07-13 VITALS — BP 113/73 | HR 124

## 2021-07-13 DIAGNOSIS — K50919 Crohn's disease, unspecified, with unspecified complications: Secondary | ICD-10-CM

## 2021-07-13 NOTE — Patient Instructions (Signed)
Humira 4m injection given today (3/13)  ? ?Give Humira 887mtomorrow 3/14 and then next dose of 8050ms due on 3/28. After that will go to 86m66mery 2 weeks with first dose of 86mg85m on 4/11.  ? ? ?

## 2021-07-13 NOTE — Telephone Encounter (Signed)
Need to get auth for 110m.  ?

## 2021-07-13 NOTE — Progress Notes (Signed)
Patient arrives today with daughter Museum/gallery conservator for humira injection teaching. Patient did bring in humira 2m injection. I used demostration pen and taught daughter how to give injection of humira. She practiced with demo pen and then daughter gave injection with me watching. Daughter states she will be the one giving her the injections. ? ?Patient evaluated by this provider 15 minutes after initial Humira injection. Patient tolerating well, no signs of adverse reaction noted.  ? ?Chelsea L. CAlver Sorrow MSN, APRN, AGNP-C ?Adult-Gerontology Nurse Practitioner ?[facility] ? ?

## 2021-07-14 NOTE — Telephone Encounter (Signed)
started dose approved 07/03/21 -07/04/22 and maint dose approved 08/01/21-07/04/22.  ?

## 2021-07-15 ENCOUNTER — Ambulatory Visit (HOSPITAL_COMMUNITY): Payer: 59 | Admitting: Anesthesiology

## 2021-07-15 ENCOUNTER — Ambulatory Visit (HOSPITAL_BASED_OUTPATIENT_CLINIC_OR_DEPARTMENT_OTHER): Payer: 59 | Admitting: Anesthesiology

## 2021-07-15 ENCOUNTER — Encounter (HOSPITAL_COMMUNITY): Payer: Self-pay | Admitting: Internal Medicine

## 2021-07-15 ENCOUNTER — Encounter (HOSPITAL_COMMUNITY)
Admission: RE | Disposition: A | Discharge status: Home / Self Care | Payer: Self-pay | Source: Home / Self Care | Attending: Internal Medicine

## 2021-07-15 ENCOUNTER — Ambulatory Visit (HOSPITAL_COMMUNITY)
Admission: RE | Admit: 2021-07-15 | Discharge: 2021-07-15 | Disposition: A | Discharge status: Home / Self Care | Payer: 59 | Attending: Internal Medicine | Admitting: Internal Medicine

## 2021-07-15 DIAGNOSIS — K624 Stenosis of anus and rectum: Secondary | ICD-10-CM | POA: Diagnosis not present

## 2021-07-15 DIAGNOSIS — Z79899 Other long term (current) drug therapy: Secondary | ICD-10-CM | POA: Diagnosis not present

## 2021-07-15 DIAGNOSIS — I10 Essential (primary) hypertension: Secondary | ICD-10-CM | POA: Diagnosis not present

## 2021-07-15 DIAGNOSIS — K5190 Ulcerative colitis, unspecified, without complications: Secondary | ICD-10-CM | POA: Diagnosis not present

## 2021-07-15 DIAGNOSIS — Z538 Procedure and treatment not carried out for other reasons: Secondary | ICD-10-CM | POA: Diagnosis not present

## 2021-07-15 DIAGNOSIS — J449 Chronic obstructive pulmonary disease, unspecified: Secondary | ICD-10-CM

## 2021-07-15 DIAGNOSIS — K50119 Crohn's disease of large intestine with unspecified complications: Secondary | ICD-10-CM

## 2021-07-15 DIAGNOSIS — K5010 Crohn's disease of large intestine without complications: Secondary | ICD-10-CM | POA: Insufficient documentation

## 2021-07-15 DIAGNOSIS — F17210 Nicotine dependence, cigarettes, uncomplicated: Secondary | ICD-10-CM | POA: Diagnosis not present

## 2021-07-15 DIAGNOSIS — R569 Unspecified convulsions: Secondary | ICD-10-CM | POA: Diagnosis not present

## 2021-07-15 DIAGNOSIS — F419 Anxiety disorder, unspecified: Secondary | ICD-10-CM | POA: Insufficient documentation

## 2021-07-15 DIAGNOSIS — R69 Illness, unspecified: Secondary | ICD-10-CM | POA: Diagnosis not present

## 2021-07-15 DIAGNOSIS — K219 Gastro-esophageal reflux disease without esophagitis: Secondary | ICD-10-CM | POA: Insufficient documentation

## 2021-07-15 DIAGNOSIS — Z91199 Patient's noncompliance with other medical treatment and regimen due to unspecified reason: Secondary | ICD-10-CM | POA: Diagnosis not present

## 2021-07-15 HISTORY — PX: SIGMOIDOSCOPY: SHX6686

## 2021-07-15 HISTORY — PX: BIOPSY: SHX5522

## 2021-07-15 LAB — HM COLONOSCOPY

## 2021-07-15 SURGERY — BIOPSY
Anesthesia: General

## 2021-07-15 MED ORDER — PROPOFOL 10 MG/ML IV BOLUS
INTRAVENOUS | Status: DC | PRN
  Administered 2021-07-15: 100 mg via INTRAVENOUS

## 2021-07-15 MED ORDER — PROPOFOL 500 MG/50ML IV EMUL
INTRAVENOUS | Status: DC | PRN
  Administered 2021-07-15: 150 ug/kg/min via INTRAVENOUS

## 2021-07-15 MED ORDER — LACTATED RINGERS IV SOLN: INTRAVENOUS | Status: DC

## 2021-07-15 NOTE — Discharge Instructions (Signed)
No aspirin or NSAIDs. ?Resume usual medications as before. ?Low residue diet. ?No driving for 24 hours. ?Physician will call with biopsy results and further recommendations. ?

## 2021-07-15 NOTE — H&P (Signed)
[name] is an 51 y.o. female.   ?Chief Complaint: Patient is here for colonoscopy. ?HPI: Patient is 51 year old Caucasian female with history of Crohn's disease who was hospitalized in September or October last year for acute symptoms.  In October she was found to have sigmoid colon stricture and ulceration distal to it.  She has been treated with steroids and infliximab.  We felt infliximab was helping but she developed rash and antibodies and was discontinued.  Now she is on adalimumab.  She is undergoing colonoscopy next assess disease activity and extent of her disease.  Prior exams are only sigmoidoscopies.  If stricture is significant would be redilated. ?She remains with chronic lower abdominal pain and diarrhea.  She has urgency and incontinence.  She notices very small amount of blood with her bowel movements. ? ?Past Medical History:  ?Diagnosis Date  ? Anxiety   ? Asthma   ? Chronic low back pain   ? Collagen vascular disease ([place])   ? COPD (chronic obstructive pulmonary disease) ([place])   ? Crohn's disease ([place])   ? GERD (gastroesophageal reflux disease)   ? EGD 01/2007 by Dr.Rourke small hiatal hernia s/p 69 french maloney   ? History of head injury   ? Hypertension   ? IBS (irritable bowel syndrome)   ? PSVT (paroxysmal supraventricular tachycardia) ([place])   ? PTSD (post-traumatic stress disorder)   ? Recurrent chest pain   ? Seizure disorder ([place])   ? Type 2 diabetes mellitus ([place])   ? ? ?Past Surgical History:  ?Procedure Laterality Date  ? ABDOMINAL HYSTERECTOMY    ? with right salpingo oophorectomy 2005  ? APPENDECTOMY  2006  ? BALLOON DILATION N/A 02/19/2021  ? Procedure: BALLOON DILATION;  Surgeon: Eloise Harman, DO;  [facility];  Service: Endoscopy;  Laterality: N/A;  Sigmoid colon stricture  ? BIOPSY  02/06/2021  ? Procedure: BIOPSY;  Surgeon: Eloise Harman, DO;  [facility];  Service: Endoscopy;;  ? BIOPSY  02/19/2021  ? Procedure: BIOPSY;  Surgeon: Eloise Harman, DO;  [facility];  Service: Endoscopy;;  ? CESAREAN SECTION    ? 1996  ? CHOLECYSTECTOMY    ? COLONOSCOPY  2010  ? Dr. Gala Romney; negative except for hemorrhoids  ? ESOPHAGEAL DILATION N/A 10/25/2014  ? Procedure: ESOPHAGEAL DILATION;  Surgeon: Rogene Houston, MD;  [facility];  Service: Endoscopy;  Laterality: N/A;  ? ESOPHAGOGASTRODUODENOSCOPY N/A 10/25/2014  ? Procedure: ESOPHAGOGASTRODUODENOSCOPY (EGD);  Surgeon: Rogene Houston, MD;  [facility];  Service: Endoscopy;  Laterality: N/A;  1250  ? FLEXIBLE SIGMOIDOSCOPY N/A 02/06/2021  ? Procedure: FLEXIBLE SIGMOIDOSCOPY;  Surgeon: Eloise Harman, DO;  [facility];  Service: Endoscopy;  Laterality: N/A;  ? FLEXIBLE SIGMOIDOSCOPY N/A 02/19/2021  ? Procedure: FLEXIBLE SIGMOIDOSCOPY;  Surgeon: Eloise Harman, DO;  [facility];  Service: Endoscopy;  Laterality: N/A;  ? OOPHORECTOMY    ? left for torsion and ovarian fibroma; uterine myoma resected 1995  ? ? ?Family History  ?Problem Relation Age of Onset  ? Cancer Mother   ? Heart failure Mother   ? Hypertension Mother   ? Heart attack Mother   ? Heart attack Father 38  ? Cancer Father 72  ? Heart failure Father   ? Diabetes Father   ? Crohn's disease Cousin   ? ?Social History:  reports that she has been smoking cigarettes. She started smoking about 36  years ago. She has a 10.00 pack-year smoking history. She has never used smokeless tobacco. She reports that she does not currently use drugs. She reports that she does not drink alcohol. ? ?Allergies:  ?Allergies  ?Allergen Reactions  ? Codeine Shortness Of Breath, Swelling and Rash  ?  Throat swelling  ? Dilaudid [Hydromorphone Hcl] Shortness Of Breath and Swelling  ? Hydrocodone Shortness Of Breath, Swelling and Rash  ? Morphine And Related Shortness Of Breath, Swelling and Rash  ? Oxycodone Shortness Of Breath, Swelling and Rash  ?  Patient states ALL pain medications make her deathly sick.  ?  Penicillins Shortness Of Breath, Swelling and Other (See Comments)  ?  Has patient had a PCN reaction causing immediate rash, facial/tongue/throat swelling, SOB or lightheadedness with hypotension: Yes ?Has patient had a PCN reaction causing severe rash involving mucus membranes or skin necrosis: Yes ?Has patient had a PCN reaction that required hospitalization Yes ?Has patient had a PCN reaction occurring within the last 10 years: No ?If all of the above answers are "NO", then may proceed with Cephalosporin use. ? ? ?Throat swells ? ?02/06/21--TOLERATES CEFTRIAXONE  ?  ? Acetaminophen   ?  Per MD patient states she can't take Tylenol because of liver enzymes  ? Ativan [Lorazepam] Other (See Comments)  ?  Migraines.  ? Strawberry Extract Swelling  ? Watermelon Concentrate Swelling  ? Albuterol Palpitations  ? Benadryl [Diphenhydramine Hcl] Palpitations  ? Latex Rash  ? Remicade [Infliximab] Rash  ?  Blisters and Welts ?  ? ? ?Medications Prior to Admission  ?Medication Sig Dispense Refill  ? ALPRAZolam (XANAX) 1 MG tablet Take 1 mg by mouth 3 (three) times daily as needed for anxiety.    ? amitriptyline (ELAVIL) 75 MG tablet Take 75 mg by mouth at bedtime.    ? cetirizine (ZYRTEC) 10 MG tablet Take 10 mg by mouth daily.    ? cyclobenzaprine (FLEXERIL) 10 MG tablet Take 10 mg by mouth 3 (three) times daily as needed for muscle spasms.    ? DULoxetine (CYMBALTA) 30 MG capsule Take 30 mg by mouth daily.    ? levETIRAcetam (KEPPRA) 750 MG tablet Take 750 mg by mouth 2 (two) times daily.    ? loperamide (IMODIUM) 2 MG capsule Take 1 capsule (2 mg total) by mouth 3 (three) times daily before meals. (Patient taking differently: Take 2 mg by mouth as needed for diarrhea or loose stools.) 30 capsule   ? mesalamine (APRISO) 0.375 g 24 hr capsule Take 4 capsules (1.5 g total) by mouth daily. (Patient taking differently: Take 375 mg by mouth daily as needed (stomach pain).) 120 capsule 5  ? omeprazole (PRILOSEC) 40 MG capsule  Take 40 mg by mouth daily.    ? ondansetron (ZOFRAN ODT) 8 MG disintegrating tablet Take 1 tablet (8 mg total) every 8 (eight) hours as needed by mouth for nausea or vomiting. 15 tablet 0  ? promethazine (PHENERGAN) 25 MG tablet Take 1 tablet (25 mg total) by mouth every 6 (six) hours as needed for nausea or vomiting. 12 tablet 0  ? RESTASIS 0.05 % ophthalmic emulsion Place 1 drop into both eyes 2 (two) times daily.    ? SPIRIVA RESPIMAT 2.5 MCG/ACT AERS Inhale 1 puff into the lungs daily as needed (shortness of breath). (Patient taking differently: Inhale 1 puff into the lungs daily.) 4 g 1  ? acetaminophen (TYLENOL) 325 MG tablet Take 2 tablets (650 mg total) by mouth every 6 (  six) hours as needed for mild pain or fever (or Fever >/= 101). (Patient not taking: Reported on 07/08/2021) 12 tablet 0  ? Adalimumab (HUMIRA) 40 MG/0.4ML PSKT 67m every other week. 2 each 0  ? albuterol (VENTOLIN HFA) 108 (90 Base) MCG/ACT inhaler Inhale 2 puffs into the lungs every 4 (four) hours as needed for wheezing or shortness of breath. (Patient not taking: Reported on 07/08/2021) 18 g 1  ? folic acid (FOLVITE) 1 MG tablet Take 1 tablet (1 mg total) by mouth daily. (Patient not taking: Reported on 06/09/2021) 30 tablet 2  ? methylPREDNISolone (MEDROL DOSEPAK) 4 MG TBPK tablet Take 6 tablets on day 1, take 5 tablets on day 2, take 4 tablets on day 3, take 3 tablets on day 4, take 2 tablets on day 5, take 1 tablet on day 6. (Patient not taking: Reported on 07/08/2021) 21 tablet 0  ? nitroGLYCERIN (NITROSTAT) 0.4 MG SL tablet Place 1 tablet (0.4 mg total) under the tongue every 5 (five) minutes as needed for chest pain. 100 tablet 0  ? polyethylene glycol-electrolytes (TRILYTE) 420 g solution Take 4,000 mLs by mouth as directed. 4000 mL 0  ? potassium chloride SA (KLOR-CON) 20 MEQ tablet Take 1 tablet (20 mEq total) by mouth daily. (Patient not taking: Reported on 06/09/2021) 5 tablet 0  ? ? ?No results found for this or any previous visit  (from the past 48 hour(s)). ?No results found. ? ?Review of Systems ? ?Blood pressure 114/73, pulse (!) 109, temperature 98.2 ?F (36.8 ?C), temperature source Oral, resp. rate (!) 9, height 5' 5"  (1.651 m), weig

## 2021-07-15 NOTE — Transfer of Care (Signed)
Immediate Anesthesia Transfer of Care Note ? ?Patient: [name] ? ?Procedure(s) Performed: BIOPSY ?SIGMOIDOSCOPY ? ?Patient Location: Short Stay ? ?Anesthesia Type:General ? ?Level of Consciousness: awake ? ?Airway & Oxygen Therapy: Patient Spontanous Breathing ? ?Post-op Assessment: Report given to RN ? ?Post vital signs: Reviewed and stable ? ?Last Vitals:  ?Vitals Value Taken Time  ?BP    ?Temp    ?Pulse    ?Resp    ?SpO2    ? ? ?Last Pain:  ?Vitals:  ? 07/15/21 1317  ?TempSrc:   ?PainSc: 8   ?   ? ?Patients Stated Pain Goal: 6 (07/15/21 1227) ? ?Complications: No notable events documented. ?

## 2021-07-15 NOTE — Op Note (Signed)
[facility] ?Patient Name: [name] ?Procedure Date: 07/15/2021 1:02 PM ?MRN: [id number] ?Date of Birth: [birth year] ?Attending MD: Hildred Laser , MD ?CSN: [id number] ?Age: 51 ?Admit Type: Outpatient ?Procedure:                Colonoscopy; incomplete secondary to poor prep. ?Indications:              Crohn's disease of the colon, Disease activity  ?                          assessment of Crohn's disease of the colon ?Providers:                Hildred Laser, MD, Lambert Mody, Starla Link RN, RN ?Referring MD:             Teodoro Kil, PA-C ?Medicines:                Propofol per Anesthesia ?Complications:            No immediate complications. ?Estimated Blood Loss:     Estimated blood loss was minimal. ?Procedure:                Pre-Anesthesia Assessment: ?                          - Prior to the procedure, a History and Physical  ?                          was performed, and patient medications and  ?                          allergies were reviewed. The patient's tolerance of  ?                          previous anesthesia was also reviewed. The risks  ?                          and benefits of the procedure and the sedation  ?                          options and risks were discussed with the patient.  ?                          All questions were answered, and informed consent  ?                          was obtained. Prior Anticoagulants: The patient has  ?                          taken no previous anticoagulant or antiplatelet  ?                          agents. ASA Grade Assessment: III - A patient with  ?  severe systemic disease. After reviewing the risks  ?                          and benefits, the patient was deemed in  ?                          satisfactory condition to undergo the procedure. ?                          After obtaining informed consent, the colonoscope  ?                          was passed under direct vision.  Throughout the  ?                          procedure, the patient's blood pressure, pulse, and  ?                          oxygen saturations were monitored continuously. The  ?                          PCF-HQ190L ([id number]) scope was introduced through  ?                          the anus with the intention of advancing to the  ?                          cecum. The scope was advanced to the sigmoid colon  ?                          before the procedure was aborted. Medications were  ?                          given. The colonoscopy was performed without  ?                          difficulty. The patient tolerated the procedure  ?                          well. The quality of the bowel preparation was poor. ?Scope In: 1:19:27 PM ?Scope Out: 1:25:06 PM ?Total Procedure Duration: 0 hours 5 minutes 39 seconds  ?Findings: ?     The perianal examination was normal. ?     The digital rectal exam findings include rectal stricture. ?     A large amount of stool was found in the rectum and in the sigmoid  ?     colon, precluding visualization. ?     Inflammation characterized by congestion (edema), erosions and loss of  ?     vascularity was found in the distal sigmoid colon and rectum. This was  ?     severe. Biopsies were taken with a cold forceps for histology. The  ?     pathology specimen was placed into Bottle Number 1. ?Impression:               - Preparation of  the colon was poor resulting in  ?                          incomplete examination. ?                          -Formed stool noted in rectum and sigmoid colon. ?                          - Rectal stricture found on digital rectal exam.  ?                          Stricture is noncritical. ?                          - Crohn's disease with colonic involvement.  ?                          Inflammation was found. This was severe. Biopsied. ?Moderate Sedation: ?     Per Anesthesia Care ?Recommendation:           - Patient has a contact number available for  ?                           emergencies. The signs and symptoms of potential  ?                          delayed complications were discussed with the  ?                          patient. Return to normal activities tomorrow.  ?                          Written discharge instructions were provided to the  ?                          patient. ?                          - Low fiber diet today. ?                          - Continue present medications. ?                          - No aspirin, ibuprofen, naproxen, or other  ?                          non-steroidal anti-inflammatory drugs. ?                          - Await pathology results. ?Procedure Code(s):        --- Professional --- ?                          66440, 52, Colonoscopy, flexible; with biopsy,  ?  single or multiple ?Diagnosis Code(s):        --- Professional --- ?                          K62.4, Stenosis of anus and rectum ?                          K50.10, Crohn's disease of large intestine without  ?                          complications ?CPT copyright 2019 [employer]. All rights reserved. ?The codes documented in this report are preliminary and upon coder review may  ?be revised to meet current compliance requirements. ?Hildred Laser, MD ?Hildred Laser, MD ?07/15/2021 1:39:03 PM ?This report has been signed electronically. ?Number of Addenda: 0 ?

## 2021-07-15 NOTE — Anesthesia Preprocedure Evaluation (Signed)
Anesthesia Evaluation  ?Patient identified by MRN, date of birth, ID band ?Patient awake ? ? ? ?Reviewed: ?Allergy & Precautions, H&P , NPO status , Patient's Chart, lab work & pertinent test results, reviewed documented beta blocker date and time  ? ?Airway ?Mallampati: II ? ?TM Distance: >3 FB ?Neck ROM: full ? ? ? Dental ?no notable dental hx. ? ?  ?Pulmonary ?asthma , COPD, Current Smoker,  ?  ?Pulmonary exam normal ?breath sounds clear to auscultation ? ? ? ? ? ? Cardiovascular ?Exercise Tolerance: Good ?hypertension, negative cardio ROS ? ? ?Rhythm:regular Rate:Normal ? ? ?  ?Neuro/Psych ?Seizures -,  PSYCHIATRIC DISORDERS Anxiety  Neuromuscular disease   ? GI/Hepatic ?Neg liver ROS, GERD  Medicated,  ?Endo/Other  ?negative endocrine ROSdiabetes ? Renal/GU ?ARFRenal disease  ?negative genitourinary ?  ?Musculoskeletal ? ? Abdominal ?  ?Peds ? Hematology ?negative hematology ROS ?(+)   ?Anesthesia Other Findings ? ? Reproductive/Obstetrics ?negative OB ROS ? ?  ? ? ? ? ? ? ? ? ? ? ? ? ? ?  ?  ? ? ? ? ? ? ? ? ?Anesthesia Physical ? ?Anesthesia Plan ? ?ASA: 3 and emergent ? ?Anesthesia Plan: General  ? ?Post-op Pain Management:   ? ?Induction:  ? ?PONV Risk Score and Plan: Propofol infusion ? ?Airway Management Planned:  ? ?Additional Equipment:  ? ?Intra-op Plan:  ? ?Post-operative Plan:  ? ?Informed Consent: I have reviewed the patients History and Physical, chart, labs and discussed the procedure including the risks, benefits and alternatives for the proposed anesthesia with the patient or authorized representative who has indicated his/her understanding and acceptance.  ? ? ? ?Dental Advisory Given ? ?Plan Discussed with: CRNA ? ?Anesthesia Plan Comments:   ? ? ? ? ? ? ?Anesthesia Quick Evaluation ? ?

## 2021-07-15 NOTE — Anesthesia Postprocedure Evaluation (Signed)
Anesthesia Post Note ? ?Patient: [name] ? ?Procedure(s) Performed: BIOPSY ?SIGMOIDOSCOPY ? ?Patient location during evaluation: Short Stay ?Anesthesia Type: General ?Level of consciousness: awake and alert ?Pain management: pain level controlled ?Vital Signs Assessment: post-procedure vital signs reviewed and stable ?Respiratory status: spontaneous breathing ?Cardiovascular status: blood pressure returned to baseline and stable ?Postop Assessment: no apparent nausea or vomiting ?Anesthetic complications: no ? ? ?No notable events documented. ? ? ?Last Vitals:  ?Vitals:  ? 07/15/21 1227 07/15/21 1230  ?BP: 123/62 114/73  ?Pulse: (!) 116 (!) 109  ?Resp: (!) 22 (!) 9  ?Temp: 36.8 ?C   ?SpO2: 99% 99%  ?  ?Last Pain:  ?Vitals:  ? 07/15/21 1317  ?TempSrc:   ?PainSc: 8   ? ? ?  ?  ?  ?  ?  ?  ? ?[name] ? ? ? ? ?

## 2021-07-16 ENCOUNTER — Encounter (INDEPENDENT_AMBULATORY_CARE_PROVIDER_SITE_OTHER): Payer: Self-pay | Admitting: *Deleted

## 2021-07-16 ENCOUNTER — Telehealth (INDEPENDENT_AMBULATORY_CARE_PROVIDER_SITE_OTHER): Payer: Self-pay

## 2021-07-16 ENCOUNTER — Ambulatory Visit (INDEPENDENT_AMBULATORY_CARE_PROVIDER_SITE_OTHER): Payer: 59 | Admitting: Internal Medicine

## 2021-07-16 LAB — SURGICAL PATHOLOGY

## 2021-07-16 NOTE — Telephone Encounter (Signed)
Patient came into the office with abd pain and elevated pulse of 136. Per Dr. Laural Golden patient will need to go to the ED she needs IV fluids. Patient states understanding.  ?

## 2021-07-18 ENCOUNTER — Other Ambulatory Visit (INDEPENDENT_AMBULATORY_CARE_PROVIDER_SITE_OTHER): Payer: Self-pay | Admitting: Internal Medicine

## 2021-07-18 MED ORDER — MILK AND MOLASSES ENEMA: 1.0000 | Freq: Every day | RECTAL | 0 refills | Status: AC

## 2021-07-20 ENCOUNTER — Encounter (HOSPITAL_COMMUNITY): Payer: Self-pay | Admitting: Internal Medicine

## 2021-07-28 ENCOUNTER — Telehealth (INDEPENDENT_AMBULATORY_CARE_PROVIDER_SITE_OTHER): Payer: Self-pay | Admitting: *Deleted

## 2021-07-28 NOTE — Telephone Encounter (Signed)
Patient left vm yesterday close to 5pm. Was not able to make out her message on vm due to it being really low. I called patient today and let her know I could not make out her message. She states she does not remember what she called for and asked me when was her next appt. I let her know her next appointment is 4/17 and asked her how she was doing on humira. She states better than the infusion and her rash is clearing up now that she has stopped infusion.  ?

## 2021-08-05 ENCOUNTER — Encounter (HOSPITAL_COMMUNITY): Payer: 59

## 2021-08-05 NOTE — Telephone Encounter (Signed)
error 

## 2021-08-17 ENCOUNTER — Ambulatory Visit (INDEPENDENT_AMBULATORY_CARE_PROVIDER_SITE_OTHER): Payer: 59 | Admitting: Gastroenterology

## 2021-08-17 ENCOUNTER — Encounter (INDEPENDENT_AMBULATORY_CARE_PROVIDER_SITE_OTHER): Payer: Self-pay | Admitting: Gastroenterology

## 2021-08-17 VITALS — BP 0/0 | Ht 64.5 in | Wt 124.0 lb

## 2021-08-17 DIAGNOSIS — K56699 Other intestinal obstruction unspecified as to partial versus complete obstruction: Secondary | ICD-10-CM

## 2021-08-17 DIAGNOSIS — K50119 Crohn's disease of large intestine with unspecified complications: Secondary | ICD-10-CM

## 2021-08-17 NOTE — Patient Instructions (Signed)
Continue Humira every other week ?Continue Apriso ?Perform blood workup a day before your 6th dose of Humira (6/10) ?

## 2021-08-17 NOTE — Progress Notes (Signed)
[name], M.D. ?Gastroenterology & Hepatology ?[facility] ?[address] ?[place], [place] [address] ?Primary Care Physician: ?Teodoro Kil, PA-C ?[address] [place] ?[place] [address] ? ?This is a telephone virtual visit.  It required patient-provider interaction for the medical decision making as documented below. ?The patient has consented and agreed to proceed with a Telehealth encounter. ? ?VIRTUAL VISIT NOTE ?Patient location: home ?Provider location: office ? ?I will communicate my assessment and recommendations to the referring MD via EMR. ? ?Problems: ?Colonic Crohn's disease, stricturing disease ?2. Allergic reaction to Remicade ? ?History of Present Illness: ?[name] is a 51 y.o. female with past medical history of anxiety, COPD, colonic Crohn's, GERD, HTN, IBS, PTSD, Type II DM who comes for follow up of colonic Crohn's disease. ? ?Patient was last seen on 06/09/2021.  At that time she was scheduled to have infliximab antibody levels checked prior to her next infusion.  She was started on Apriso 1500 mg daily and loperamide 2 mg twice a day. ? ?The patient underwent a colonoscopy on 07/15/2021 (Dr. Laural Golden) and was found to have inflammation in the distal sigmoid colon and rectum which was severe because he was large amount of stool in her colon precluding visualization.  There was also presence of noncritical rectal stricture and the rectal exam.  Biopsies showed chronic active colitis with ulceration and necroinflammatory debris. ? ?Patient is a very poor historian and does not provide information accurately, even though I ask multiple times the same questions. ? ?Most recently, the patient was switched to Humira SQ injections.  She was switched as she developed a rash in her skin with the Remicade. She has been presenting significant itching every time she receives her injections. She also reports having significant erythema  in her skin with the injections. ? ?She reports that her symptoms have been getting worse after she was started on mesalamine. She is having bowel movements 7-8 times a day, which comes as liquid bowel movements. She has significant tenesmus - feels she cannot completely empty her bowels , as well as rectal urgency. Also reports having pain in her lower abdomen. Sometimes she has dark stools. ? ?She has used Humira 2 times so far. She is receiving the medication every 2 weeks. Last Humira dose was Sunday. ? ?The patient denies having any fever, chills, hematochezia, hematemesis, jaundice, pruritus. ? ?Past Medical History: ?Past Medical History:  ?Diagnosis Date  ? Anxiety   ? Asthma   ? Chronic low back pain   ? Collagen vascular disease ([place])   ? COPD (chronic obstructive pulmonary disease) ([place])   ? Crohn's disease ([place])   ? GERD (gastroesophageal reflux disease)   ? EGD 01/2007 by Dr.Rourke small hiatal hernia s/p 60 french maloney   ? History of head injury   ? Hypertension   ? IBS (irritable bowel syndrome)   ? PSVT (paroxysmal supraventricular tachycardia) ([place])   ? PTSD (post-traumatic stress disorder)   ? Recurrent chest pain   ? Seizure disorder ([place])   ? Type 2 diabetes mellitus ([place])   ? ? ?Past Surgical History: ?Past Surgical History:  ?Procedure Laterality Date  ? ABDOMINAL HYSTERECTOMY    ? with right salpingo oophorectomy 2005  ? APPENDECTOMY  2006  ? BALLOON DILATION N/A 02/19/2021  ? Procedure: BALLOON DILATION;  Surgeon: Eloise Harman, DO;  [facility];  Service: Endoscopy;  Laterality: N/A;  Sigmoid colon stricture  ? BIOPSY  02/06/2021  ?  Procedure: BIOPSY;  Surgeon: Eloise Harman, DO;  [facility];  Service: Endoscopy;;  ? BIOPSY  02/19/2021  ? Procedure: BIOPSY;  Surgeon: Eloise Harman, DO;  [facility];  Service: Endoscopy;;  ? BIOPSY  07/15/2021  ? Procedure: BIOPSY;  Surgeon: Rogene Houston, MD;  [facility];  Service: Endoscopy;;   ? CESAREAN SECTION    ? 1996  ? CHOLECYSTECTOMY    ? COLONOSCOPY  2010  ? Dr. Gala Romney; negative except for hemorrhoids  ? ESOPHAGEAL DILATION N/A 10/25/2014  ? Procedure: ESOPHAGEAL DILATION;  Surgeon: Rogene Houston, MD;  [facility];  Service: Endoscopy;  Laterality: N/A;  ? ESOPHAGOGASTRODUODENOSCOPY N/A 10/25/2014  ? Procedure: ESOPHAGOGASTRODUODENOSCOPY (EGD);  Surgeon: Rogene Houston, MD;  [facility];  Service: Endoscopy;  Laterality: N/A;  1250  ? FLEXIBLE SIGMOIDOSCOPY N/A 02/06/2021  ? Procedure: FLEXIBLE SIGMOIDOSCOPY;  Surgeon: Eloise Harman, DO;  [facility];  Service: Endoscopy;  Laterality: N/A;  ? FLEXIBLE SIGMOIDOSCOPY N/A 02/19/2021  ? Procedure: FLEXIBLE SIGMOIDOSCOPY;  Surgeon: Eloise Harman, DO;  [facility];  Service: Endoscopy;  Laterality: N/A;  ? OOPHORECTOMY    ? left for torsion and ovarian fibroma; uterine myoma resected 1995  ? SIGMOIDOSCOPY  07/15/2021  ? Procedure: SIGMOIDOSCOPY;  Surgeon: Rogene Houston, MD;  [facility];  Service: Endoscopy;;  ? ? ?Family History: ?Family History  ?Problem Relation Age of Onset  ? Cancer Mother   ? Heart failure Mother   ? Hypertension Mother   ? Heart attack Mother   ? Heart attack Father 48  ? Cancer Father 84  ? Heart failure Father   ? Diabetes Father   ? Crohn's disease Cousin   ? ? ?Social History: ?Social History  ? ?Tobacco Use  ?Smoking Status Some Days  ? Packs/day: 0.50  ? Years: 20.00  ? Pack years: 10.00  ? Types: Cigarettes  ? Start date: 09/26/1984  ?Smokeless Tobacco Never  ? ?Social History  ? ?Substance and Sexual Activity  ?Alcohol Use No  ? Alcohol/week: 0.0 standard drinks  ? ?Social History  ? ?Substance and Sexual Activity  ?Drug Use Not Currently  ? ? ?Allergies: ?Allergies  ?Allergen Reactions  ? Codeine Shortness Of Breath, Swelling and Rash  ?  Throat swelling  ? Dilaudid [Hydromorphone Hcl] Shortness Of Breath and Swelling  ? Hydrocodone Shortness Of Breath,  Swelling and Rash  ? Morphine And Related Shortness Of Breath, Swelling and Rash  ? Oxycodone Shortness Of Breath, Swelling and Rash  ?  Patient states ALL pain medications make her deathly sick.  ? Penicillins Shortness Of Breath, Swelling and Other (See Comments)  ?  Has patient had a PCN reaction causing immediate rash, facial/tongue/throat swelling, SOB or lightheadedness with hypotension: Yes ?Has patient had a PCN reaction causing severe rash involving mucus membranes or skin necrosis: Yes ?Has patient had a PCN reaction that required hospitalization Yes ?Has patient had a PCN reaction occurring within the last 10 years: No ?If all of the above answers are "NO", then may proceed with Cephalosporin use. ? ? ?Throat swells ? ?02/06/21--TOLERATES CEFTRIAXONE  ?  ? Acetaminophen   ?  Per MD patient states she can't take Tylenol because of liver enzymes  ? Ativan [Lorazepam] Other (See Comments)  ?  Migraines.  ? Strawberry Extract Swelling  ? Watermelon Concentrate Swelling  ? Albuterol Palpitations  ? Benadryl [Diphenhydramine Hcl] Palpitations  ?  Latex Rash  ? Remicade [Infliximab] Rash  ?  Blisters and Welts ?  ? ? ?Medications: ?Current Outpatient Medications  ?Medication Sig Dispense Refill  ? Adalimumab (HUMIRA) 40 MG/0.4ML PSKT 77m every other week. 2 each 0  ? albuterol (VENTOLIN HFA) 108 (90 Base) MCG/ACT inhaler Inhale 2 puffs into the lungs every 4 (four) hours as needed for wheezing or shortness of breath. 18 g 1  ? ALPRAZolam (XANAX) 1 MG tablet Take 1 mg by mouth 3 (three) times daily as needed for anxiety.    ? amitriptyline (ELAVIL) 75 MG tablet Take 75 mg by mouth at bedtime.    ? cetirizine (ZYRTEC) 10 MG tablet Take 10 mg by mouth daily.    ? cyclobenzaprine (FLEXERIL) 10 MG tablet Take 10 mg by mouth 3 (three) times daily as needed for muscle spasms.    ? DULoxetine (CYMBALTA) 30 MG capsule Take 30 mg by mouth daily.    ? levETIRAcetam (KEPPRA) 750 MG tablet Take 750 mg by mouth 2 (two) times  daily.    ? nitroGLYCERIN (NITROSTAT) 0.4 MG SL tablet Place 1 tablet (0.4 mg total) under the tongue every 5 (five) minutes as needed for chest pain. 100 tablet 0  ? omeprazole (PRILOSEC) 40 MG capsul

## 2021-09-07 ENCOUNTER — Encounter (INDEPENDENT_AMBULATORY_CARE_PROVIDER_SITE_OTHER): Payer: Self-pay | Admitting: Gastroenterology

## 2021-09-07 ENCOUNTER — Ambulatory Visit (INDEPENDENT_AMBULATORY_CARE_PROVIDER_SITE_OTHER): Payer: 59 | Admitting: Gastroenterology

## 2021-09-07 VITALS — BP 103/75 | HR 128 | Temp 98.2°F | Ht 64.5 in | Wt 120.0 lb

## 2021-09-07 DIAGNOSIS — T50905D Adverse effect of unspecified drugs, medicaments and biological substances, subsequent encounter: Secondary | ICD-10-CM

## 2021-09-07 DIAGNOSIS — T50905A Adverse effect of unspecified drugs, medicaments and biological substances, initial encounter: Secondary | ICD-10-CM | POA: Insufficient documentation

## 2021-09-07 DIAGNOSIS — K50119 Crohn's disease of large intestine with unspecified complications: Secondary | ICD-10-CM | POA: Diagnosis not present

## 2021-09-07 MED ORDER — HYDROXYZINE HCL 10 MG PO TABS: 10.0000 mg | ORAL_TABLET | Freq: Every evening | ORAL | 0 refills | Status: DC

## 2021-09-07 MED ORDER — METHYLPREDNISOLONE 2 MG PO TABS: ORAL_TABLET | ORAL | 0 refills | Status: AC

## 2021-09-07 NOTE — Progress Notes (Signed)
[name], M.D. ?Gastroenterology & Hepatology ?[facility] ?[address] ?[place], [place] [address] ? ?Primary Care Physician: ?Teodoro Kil, PA-C ?[address] [place] ?[place] [address] ? ?I will communicate my assessment and recommendations to the referring MD via EMR. ? ?Problems: ?Colonic Crohn's disease, stricturing disease ?2. Allergic reaction to Remicade and Humira ? ?History of Present Illness: ?[name] is a 51 y.o. female female with past medical history of anxiety, COPD, colonic Crohn's, GERD, HTN, IBS, PTSD, Type II DM who comes for follow up of colonic Crohn's disease and skin reaction. ? ?The patient was last seen on 08/17/21. At that time, the patient was continue on Humira and Apriso. Physical exam could not be performed as she requested a telephone encounter/visit. ? ?Patient has presented recurrent episodes of allergic reactions with Humira since she started this medicine. She has frequent pruritus and pain where the erythematous lesions are located - back, sides of the abdomen, arms and legs. ? ?She reports that while on Humira, she is having 4-5 watery bowel movements per day. States she has very scant blood in her stool.  She is having frequent nausea and vomiting. She has presented frequent tenesmus. Also has presented abdominal pain in her loer abdomen mostly. ? ?She is smoking 4 packs of cigarretes in an average of 3 weeks. She reports she has tried to cut down on her smoking. ? ?She reports she had fever up to 103 when her rash started. ? ?The patient denies having any chills, hematochezia, melena, hematemesis, jaundice, pruritus. Has lost close to 30 lb since her symptoms started. ? ?Patient has an appointment with Dr. Nyoka Cowden from [facility] in 10/2021. She also has an appointment at [facility] in September which she was not aware of. All appointments were with gastroenterology. ? ?Last Colonoscopy:  ? 07/15/2021  (Dr. Laural Golden) and was found to have inflammation in the distal sigmoid colon and rectum which was severe because he was large amount of stool in her colon precluding visualization.  There was also presence of noncritical rectal stricture and the rectal exam.  Biopsies showed chronic active colitis with ulceration and necroinflammatory debris. ? ?Past Medical History: ?Past Medical History:  ?Diagnosis Date  ? Anxiety   ? Asthma   ? Chronic low back pain   ? Collagen vascular disease ([place])   ? COPD (chronic obstructive pulmonary disease) ([place])   ? Crohn's disease ([place])   ? GERD (gastroesophageal reflux disease)   ? EGD 01/2007 by Dr.Rourke small hiatal hernia s/p 73 french maloney   ? History of head injury   ? Hypertension   ? IBS (irritable bowel syndrome)   ? PSVT (paroxysmal supraventricular tachycardia) ([place])   ? PTSD (post-traumatic stress disorder)   ? Recurrent chest pain   ? Seizure disorder ([place])   ? Type 2 diabetes mellitus ([place])   ? ? ?Past Surgical History: ?Past Surgical History:  ?Procedure Laterality Date  ? ABDOMINAL HYSTERECTOMY    ? with right salpingo oophorectomy 2005  ? APPENDECTOMY  2006  ? BALLOON DILATION N/A 02/19/2021  ? Procedure: BALLOON DILATION;  Surgeon: Eloise Harman, DO;  [facility];  Service: Endoscopy;  Laterality: N/A;  Sigmoid colon stricture  ? BIOPSY  02/06/2021  ? Procedure: BIOPSY;  Surgeon: Eloise Harman, DO;  [facility];  Service: Endoscopy;;  ? BIOPSY  02/19/2021  ? Procedure: BIOPSY;  Surgeon: Eloise Harman, DO;  [facility];  Service: Endoscopy;;  ? BIOPSY  07/15/2021  ? Procedure: BIOPSY;  Surgeon: Rogene Houston, MD;  [facility];  Service: Endoscopy;;  ? CESAREAN SECTION    ? 1996  ? CHOLECYSTECTOMY    ? COLONOSCOPY  2010  ? Dr. Gala Romney; negative except for hemorrhoids  ? ESOPHAGEAL DILATION N/A 10/25/2014  ? Procedure: ESOPHAGEAL DILATION;  Surgeon: Rogene Houston, MD;  [facility];  Service:  Endoscopy;  Laterality: N/A;  ? ESOPHAGOGASTRODUODENOSCOPY N/A 10/25/2014  ? Procedure: ESOPHAGOGASTRODUODENOSCOPY (EGD);  Surgeon: Rogene Houston, MD;  [facility];  Service: Endoscopy;  Laterality: N/A;  1250  ? FLEXIBLE SIGMOIDOSCOPY N/A 02/06/2021  ? Procedure: FLEXIBLE SIGMOIDOSCOPY;  Surgeon: Eloise Harman, DO;  [facility];  Service: Endoscopy;  Laterality: N/A;  ? FLEXIBLE SIGMOIDOSCOPY N/A 02/19/2021  ? Procedure: FLEXIBLE SIGMOIDOSCOPY;  Surgeon: Eloise Harman, DO;  [facility];  Service: Endoscopy;  Laterality: N/A;  ? OOPHORECTOMY    ? left for torsion and ovarian fibroma; uterine myoma resected 1995  ? SIGMOIDOSCOPY  07/15/2021  ? Procedure: SIGMOIDOSCOPY;  Surgeon: Rogene Houston, MD;  [facility];  Service: Endoscopy;;  ? ? ?Family History: ?Family History  ?Problem Relation Age of Onset  ? Cancer Mother   ? Heart failure Mother   ? Hypertension Mother   ? Heart attack Mother   ? Heart attack Father 10  ? Cancer Father 72  ? Heart failure Father   ? Diabetes Father   ? Crohn's disease Cousin   ? ? ?Social History: ?Social History  ? ?Tobacco Use  ?Smoking Status Some Days  ? Packs/day: 0.50  ? Years: 20.00  ? Pack years: 10.00  ? Types: Cigarettes  ? Start date: 09/26/1984  ? Passive exposure: Current  ?Smokeless Tobacco Never  ? ?Social History  ? ?Substance and Sexual Activity  ?Alcohol Use No  ? Alcohol/week: 0.0 standard drinks  ? ?Social History  ? ?Substance and Sexual Activity  ?Drug Use Not Currently  ? ? ?Allergies: ?Allergies  ?Allergen Reactions  ? Codeine Shortness Of Breath, Swelling and Rash  ?  Throat swelling  ? Dilaudid [Hydromorphone Hcl] Shortness Of Breath and Swelling  ? Hydrocodone Shortness Of Breath, Swelling and Rash  ? Morphine And Related Shortness Of Breath, Swelling and Rash  ? Oxycodone Shortness Of Breath, Swelling and Rash  ?  Patient states ALL pain medications make her deathly sick.  ? Penicillins Shortness Of  Breath, Swelling and Other (See Comments)  ?  Has patient had a PCN reaction causing immediate rash, facial/tongue/throat swelling, SOB or lightheadedness with hypotension: Yes ?Has patient had a PCN reaction causing severe rash involving mucus membranes or skin necrosis: Yes ?Has patient had a PCN reaction that required hospitalization Yes ?Has patient had a PCN reaction occurring within the last 10 years: No ?If all of the above answers are "NO", then may proceed with Cephalosporin use. ? ? ?Throat swells ? ?02/06/21--TOLERATES CEFTRIAXONE  ?  ? Acetaminophen   ?  Per MD patient states she can't take Tylenol because of liver enzymes  ? Ativan [Lorazepam] Other (See Comments)  ?  Migraines.  ? Strawberry Extract Swelling  ? Watermelon Concentrate Swelling  ? Albuterol Palpitations  ? Benadryl [Diphenhydramine Hcl] Palpitations  ? Latex Rash  ? Remicade [Infliximab] Rash  ?  Blisters and Welts ?  ? ? ?Medications: ?Current Outpatient Medications  ?Medication Sig Dispense Refill  ?  Adalimumab (HUMIRA) 40 MG/0.4ML PSKT 67m every other week. 2 each 0  ? albuterol (VENTOLIN HFA) 108 (90 Base) MCG/ACT inhaler Inhale 2 puffs into the lungs every 4 (four) hours as needed for wheezing or shortness of breath. 18 g 1  ? ALPRAZolam (XANAX) 1 MG tablet Take 1 mg by mouth 3 (three) times daily as needed for anxiety.    ? amitriptyline (ELAVIL) 75 MG tablet Take 75 mg by mouth at bedtime.    ? cetirizine (ZYRTEC) 10 MG tablet Take 10 mg by mouth daily.    ? cyclobenzaprine (FLEXERIL) 10 MG tablet Take 10 mg by mouth 3 (three) times daily as needed for muscle spasms.    ? DULoxetine (CYMBALTA) 30 MG capsule Take 30 mg by mouth daily.    ? levETIRAcetam (KEPPRA) 750 MG tablet Take 750 mg by mouth 2 (two) times daily.    ? nitroGLYCERIN (NITROSTAT) 0.4 MG SL tablet Place 1 tablet (0.4 mg total) under the tongue every 5 (five) minutes as needed for chest pain. 100 tablet 0  ? omeprazole (PRILOSEC) 40 MG capsule Take 40 mg by mouth  daily.    ? ondansetron (ZOFRAN ODT) 8 MG disintegrating tablet Take 1 tablet (8 mg total) every 8 (eight) hours as needed by mouth for nausea or vomiting. 15 tablet 0  ? promethazine (PHENERGAN) 25 MG tablet [place]

## 2021-09-07 NOTE — Patient Instructions (Addendum)
Proceed with appointment with Dr. Nyoka Cowden at [facility] ?Take Medrol and hydroxyzine course ?Stop Humira ?Smoking cessation ?

## 2021-10-01 ENCOUNTER — Other Ambulatory Visit: Payer: Self-pay

## 2021-10-01 ENCOUNTER — Encounter (HOSPITAL_COMMUNITY): Payer: Self-pay | Admitting: Emergency Medicine

## 2021-10-01 ENCOUNTER — Emergency Department (HOSPITAL_COMMUNITY): Payer: 59

## 2021-10-01 ENCOUNTER — Observation Stay (HOSPITAL_COMMUNITY)
Admission: EM | Admit: 2021-10-01 | Discharge: 2021-10-03 | Disposition: A | Discharge status: Home / Self Care | Payer: Self-pay | Attending: Internal Medicine | Admitting: Internal Medicine

## 2021-10-01 DIAGNOSIS — E876 Hypokalemia: Secondary | ICD-10-CM | POA: Diagnosis not present

## 2021-10-01 DIAGNOSIS — F17200 Nicotine dependence, unspecified, uncomplicated: Secondary | ICD-10-CM | POA: Diagnosis present

## 2021-10-01 DIAGNOSIS — G9341 Metabolic encephalopathy: Secondary | ICD-10-CM | POA: Diagnosis not present

## 2021-10-01 DIAGNOSIS — K5090 Crohn's disease, unspecified, without complications: Secondary | ICD-10-CM | POA: Diagnosis present

## 2021-10-01 DIAGNOSIS — I10 Essential (primary) hypertension: Secondary | ICD-10-CM | POA: Insufficient documentation

## 2021-10-01 DIAGNOSIS — F17210 Nicotine dependence, cigarettes, uncomplicated: Secondary | ICD-10-CM | POA: Diagnosis not present

## 2021-10-01 DIAGNOSIS — K50019 Crohn's disease of small intestine with unspecified complications: Secondary | ICD-10-CM | POA: Diagnosis not present

## 2021-10-01 DIAGNOSIS — R21 Rash and other nonspecific skin eruption: Secondary | ICD-10-CM | POA: Diagnosis not present

## 2021-10-01 DIAGNOSIS — D75839 Thrombocytosis, unspecified: Secondary | ICD-10-CM | POA: Diagnosis not present

## 2021-10-01 DIAGNOSIS — J449 Chronic obstructive pulmonary disease, unspecified: Secondary | ICD-10-CM | POA: Diagnosis not present

## 2021-10-01 DIAGNOSIS — J45909 Unspecified asthma, uncomplicated: Secondary | ICD-10-CM | POA: Diagnosis not present

## 2021-10-01 DIAGNOSIS — Z91040 Latex allergy status: Secondary | ICD-10-CM | POA: Diagnosis not present

## 2021-10-01 DIAGNOSIS — E46 Unspecified protein-calorie malnutrition: Secondary | ICD-10-CM

## 2021-10-01 DIAGNOSIS — W19XXXA Unspecified fall, initial encounter: Secondary | ICD-10-CM | POA: Diagnosis not present

## 2021-10-01 DIAGNOSIS — Y92009 Unspecified place in unspecified non-institutional (private) residence as the place of occurrence of the external cause: Secondary | ICD-10-CM

## 2021-10-01 DIAGNOSIS — Z79899 Other long term (current) drug therapy: Secondary | ICD-10-CM | POA: Diagnosis not present

## 2021-10-01 DIAGNOSIS — R2681 Unsteadiness on feet: Secondary | ICD-10-CM

## 2021-10-01 DIAGNOSIS — R569 Unspecified convulsions: Secondary | ICD-10-CM

## 2021-10-01 DIAGNOSIS — K219 Gastro-esophageal reflux disease without esophagitis: Secondary | ICD-10-CM | POA: Diagnosis present

## 2021-10-01 DIAGNOSIS — E8809 Other disorders of plasma-protein metabolism, not elsewhere classified: Secondary | ICD-10-CM | POA: Insufficient documentation

## 2021-10-01 DIAGNOSIS — K50919 Crohn's disease, unspecified, with unspecified complications: Secondary | ICD-10-CM | POA: Diagnosis present

## 2021-10-01 DIAGNOSIS — E119 Type 2 diabetes mellitus without complications: Secondary | ICD-10-CM | POA: Insufficient documentation

## 2021-10-01 DIAGNOSIS — R4182 Altered mental status, unspecified: Secondary | ICD-10-CM

## 2021-10-01 DIAGNOSIS — G40909 Epilepsy, unspecified, not intractable, without status epilepticus: Secondary | ICD-10-CM

## 2021-10-01 LAB — COMPREHENSIVE METABOLIC PANEL
ALT: 21 U/L (ref 0–44)
AST: 24 U/L (ref 15–41)
Albumin: 2.1 g/dL — ABNORMAL LOW (ref 3.5–5.0)
Alkaline Phosphatase: 132 U/L — ABNORMAL HIGH (ref 38–126)
Anion gap: 10 (ref 5–15)
BUN: 7 mg/dL (ref 6–20)
CO2: 31 mmol/L (ref 22–32)
Calcium: 7.9 mg/dL — ABNORMAL LOW (ref 8.9–10.3)
Chloride: 93 mmol/L — ABNORMAL LOW (ref 98–111)
Creatinine, Ser: 0.93 mg/dL (ref 0.44–1.00)
GFR, Estimated: >60 mL/min (ref >60)
Glucose, Bld: 101 mg/dL — ABNORMAL HIGH (ref 70–99)
Potassium: 2.3 mmol/L — CL (ref 3.5–5.1)
Sodium: 134 mmol/L — ABNORMAL LOW (ref 135–145)
Total Bilirubin: 0.5 mg/dL (ref 0.3–1.2)
Total Protein: 5.6 g/dL — ABNORMAL LOW (ref 6.5–8.1)

## 2021-10-01 LAB — RAPID URINE DRUG SCREEN, HOSP PERFORMED
Amphetamines: NOT DETECTED
Barbiturates: NOT DETECTED
Benzodiazepines: POSITIVE — AB
Cocaine: NOT DETECTED
Opiates: NOT DETECTED
Tetrahydrocannabinol: POSITIVE — AB

## 2021-10-01 LAB — URINALYSIS, ROUTINE W REFLEX MICROSCOPIC
Bilirubin Urine: NEGATIVE
Glucose, UA: NEGATIVE mg/dL
Hgb urine dipstick: NEGATIVE
Ketones, ur: NEGATIVE mg/dL
Nitrite: NEGATIVE
Protein, ur: NEGATIVE mg/dL
Specific Gravity, Urine: 1.004 — ABNORMAL LOW (ref 1.005–1.030)
pH: 7 (ref 5.0–8.0)

## 2021-10-01 LAB — CBC
HCT: 36.7 % (ref 36.0–46.0)
Hemoglobin: 12.2 g/dL (ref 12.0–15.0)
MCH: 30.9 pg (ref 26.0–34.0)
MCHC: 33.2 g/dL (ref 30.0–36.0)
MCV: 92.9 fL (ref 80.0–100.0)
Platelets: 410 10*3/uL — ABNORMAL HIGH (ref 150–400)
RBC: 3.95 MIL/uL (ref 3.87–5.11)
RDW: 17.2 % — ABNORMAL HIGH (ref 11.5–15.5)
WBC: 9.2 10*3/uL (ref 4.0–10.5)
nRBC: 0 % (ref 0.0–0.2)

## 2021-10-01 LAB — MAGNESIUM: Magnesium: 2.2 mg/dL (ref 1.7–2.4)

## 2021-10-01 LAB — ETHANOL: Alcohol, Ethyl (B): <10 mg/dL (ref <10)

## 2021-10-01 LAB — AMMONIA: Ammonia: 16 umol/L (ref 9–35)

## 2021-10-01 LAB — CBG MONITORING, ED: Glucose-Capillary: 100 mg/dL — ABNORMAL HIGH (ref 70–99)

## 2021-10-01 MED ORDER — SODIUM CHLORIDE 0.9 % IV SOLN: INTRAVENOUS | Status: DC

## 2021-10-01 MED ORDER — NICOTINE 21 MG/24HR TD PT24
21.0000 mg | MEDICATED_PATCH | Freq: Every day | TRANSDERMAL | Status: DC
  Administered 2021-10-02: 21 mg via TRANSDERMAL
  Filled 2021-10-01 (×2): qty 1

## 2021-10-01 MED ORDER — IPRATROPIUM BROMIDE 0.02 % IN SOLN: 0.5000 mg | RESPIRATORY_TRACT | Status: DC | PRN

## 2021-10-01 MED ORDER — POTASSIUM CHLORIDE 10 MEQ/100ML IV SOLN
10.0000 meq | INTRAVENOUS | Status: AC
  Administered 2021-10-02 (×4): 10 meq via INTRAVENOUS
  Filled 2021-10-01 (×4): qty 100

## 2021-10-01 MED ORDER — ENSURE ENLIVE PO LIQD
237.0000 mL | Freq: Two times a day (BID) | ORAL | Status: DC
Start: 2021-10-02 — End: 2021-10-03
  Administered 2021-10-02 – 2021-10-03 (×3): 237 mL via ORAL
  Filled 2021-10-01 (×2): qty 237

## 2021-10-01 MED ORDER — TRIAMCINOLONE 0.1 % CREAM:EUCERIN CREAM 1:1
TOPICAL_CREAM | Freq: Two times a day (BID) | CUTANEOUS | Status: DC | PRN
Start: 2021-10-01 — End: 2021-10-03

## 2021-10-01 MED ORDER — POTASSIUM CHLORIDE CRYS ER 20 MEQ PO TBCR
40.0000 meq | EXTENDED_RELEASE_TABLET | Freq: Once | ORAL | Status: AC
  Administered 2021-10-01: 40 meq via ORAL
  Filled 2021-10-01: qty 2

## 2021-10-01 MED ORDER — POTASSIUM CHLORIDE 10 MEQ/100ML IV SOLN
10.0000 meq | INTRAVENOUS | Status: AC
  Administered 2021-10-01 (×2): 10 meq via INTRAVENOUS
  Filled 2021-10-01 (×2): qty 100

## 2021-10-01 MED ORDER — SODIUM CHLORIDE 0.9 % IV BOLUS
500.0000 mL | Freq: Once | INTRAVENOUS | Status: AC
  Administered 2021-10-01: 500 mL via INTRAVENOUS

## 2021-10-01 NOTE — H&P (Signed)
History and Physical    Patient: [name] YCX:[id number] DOB: 1970/07/31 DOA: 10/01/2021 DOS: the patient was seen and examined on 10/02/2021 PCP: Teodoro Kil, PA-C  Patient coming from: Home  Chief Complaint:  Chief Complaint  Patient presents with   Altered Mental Status   HPI: [name] is a 51 y.o. female with medical history significant of Crohn's disease, GERD, COPD, seizures who presents to the emergency department after being brought by private vehicle due to altered mental status.  Patient complained of lower abdominal pain and some rash in upper and lower extremities which she thought was related to Crohn's disease, but also states that it was related to falls.  Some of the history was obtained from friends (who happened to be her tenants), per report, patient has been having intermittent hallucinations and disorientation since October of last year (2022).  Patient was able to follow commands, but could not provide any further pertinent history as regards why she came to the ED.   ED Course:  In the emergency department, BP was soft at 93/68, she was initially tachycardic, but this has since normalized, other vital signs within normal range.  Work-up in the ED showed normal CBC except for thrombocytosis.  BMP showed hyponatremia, hypokalemia.  Magnesium 2.2.  Albumin 2.1, ALP 132, alcohol level was less than 10, ammonia 16, urine drug screen was positive for benzodiazepine and THC.  Urinalysis was unimpressive for UTI CT head without contrast showed no evidence of acute intracranial abnormality CT cervical spine without contrast showed no evidence of acute fracture to the cervical spine.  Nonspecific reversal of the expected cervical lordosis.  Trace C3-C4 grade 1 anterolisthesis IV hydration was provided, potassium was replenished  Review of Systems: Review of systems as noted in the HPI. All other systems reviewed and are negative.   Past Medical History:  Diagnosis  Date   Anxiety    Asthma    Chronic low back pain    Collagen vascular disease (HCC)    COPD (chronic obstructive pulmonary disease) (HCC)    Crohn's disease (HCC)    GERD (gastroesophageal reflux disease)    EGD 01/2007 by Dr.Rourke small hiatal hernia s/p 56 french maloney    History of head injury    Hypertension    IBS (irritable bowel syndrome)    PSVT (paroxysmal supraventricular tachycardia) (HCC)    PTSD (post-traumatic stress disorder)    Recurrent chest pain    Seizure disorder ([place])    Type 2 diabetes mellitus ([place])    Past Surgical History:  Procedure Laterality Date   ABDOMINAL HYSTERECTOMY     with right salpingo oophorectomy 2005   APPENDECTOMY  2006   BALLOON DILATION N/A 02/19/2021   Procedure: BALLOON DILATION;  Surgeon: Eloise Harman, DO;  [facility];  Service: Endoscopy;  Laterality: N/A;  Sigmoid colon stricture   BIOPSY  02/06/2021   Procedure: BIOPSY;  Surgeon: Eloise Harman, DO;  [facility];  Service: Endoscopy;;   BIOPSY  02/19/2021   Procedure: BIOPSY;  Surgeon: Eloise Harman, DO;  [facility];  Service: Endoscopy;;   BIOPSY  07/15/2021   Procedure: BIOPSY;  Surgeon: Rogene Houston, MD;  [facility];  Service: Endoscopy;;   CESAREAN SECTION     1996   CHOLECYSTECTOMY     COLONOSCOPY  2010   Dr. Gala Romney; negative except for hemorrhoids   ESOPHAGEAL DILATION N/A 10/25/2014   Procedure: ESOPHAGEAL DILATION;  Surgeon:  Rogene Houston, MD;  [facility];  Service: Endoscopy;  Laterality: N/A;   ESOPHAGOGASTRODUODENOSCOPY N/A 10/25/2014   Procedure: ESOPHAGOGASTRODUODENOSCOPY (EGD);  Surgeon: Rogene Houston, MD;  [facility];  Service: Endoscopy;  Laterality: N/A;  [place] N/A 02/06/2021   Procedure: FLEXIBLE SIGMOIDOSCOPY;  Surgeon: Eloise Harman, DO;  [facility];  Service: Endoscopy;  Laterality: N/A;   FLEXIBLE SIGMOIDOSCOPY N/A  02/19/2021   Procedure: FLEXIBLE SIGMOIDOSCOPY;  Surgeon: Eloise Harman, DO;  [facility];  Service: Endoscopy;  Laterality: N/A;   OOPHORECTOMY     left for torsion and ovarian fibroma; uterine myoma resected 1995   SIGMOIDOSCOPY  07/15/2021   Procedure: SIGMOIDOSCOPY;  Surgeon: Rogene Houston, MD;  [facility];  Service: Endoscopy;;    Social History:  reports that she has been smoking cigarettes. She started smoking about 37 years ago. She has a 10.00 pack-year smoking history. She has been exposed to tobacco smoke. She has never used smokeless tobacco. She reports that she does not currently use drugs. She reports that she does not drink alcohol.   Allergies  Allergen Reactions   Codeine Shortness Of Breath, Swelling and Rash    Throat swelling   Dilaudid [Hydromorphone Hcl] Shortness Of Breath and Swelling   Hydrocodone Shortness Of Breath, Swelling and Rash   Morphine And Related Shortness Of Breath, Swelling and Rash   Oxycodone Shortness Of Breath, Swelling and Rash    Patient states ALL pain medications make her deathly sick.   Penicillins Shortness Of Breath, Swelling and Other (See Comments)    Has patient had a PCN reaction causing immediate rash, facial/tongue/throat swelling, SOB or lightheadedness with hypotension: Yes Has patient had a PCN reaction causing severe rash involving mucus membranes or skin necrosis: Yes Has patient had a PCN reaction that required hospitalization Yes Has patient had a PCN reaction occurring within the last 10 years: No If all of the above answers are "NO", then may proceed with Cephalosporin use.   Throat swells  02/06/21--TOLERATES CEFTRIAXONE     Acetaminophen     Per MD patient states she can't take Tylenol because of liver enzymes   Ativan [Lorazepam] Other (See Comments)    Migraines.   Strawberry Extract Swelling   Watermelon Concentrate Swelling   Albuterol Palpitations   Benadryl [Diphenhydramine  Hcl] Palpitations   Latex Rash   Remicade [Infliximab] Rash    Blisters and Welts     Family History  Problem Relation Age of Onset   Cancer Mother    Heart failure Mother    Hypertension Mother    Heart attack Mother    Heart attack Father 59   Cancer Father 10   Heart failure Father    Diabetes Father    Crohn's disease Cousin      Prior to Admission medications   Medication Sig Start Date End Date Taking? Authorizing Provider  albuterol (VENTOLIN HFA) 108 (90 Base) MCG/ACT inhaler Inhale 2 puffs into the lungs every 4 (four) hours as needed for wheezing or shortness of breath. 01/24/21   Roxan Hockey, MD  ALPRAZolam [name]) 1 MG tablet Take 1 mg by mouth 3 (three) times daily as needed for anxiety. 07/11/19   [provider]  amitriptyline (ELAVIL) 75 MG tablet Take 75 mg by mouth at bedtime. 12/16/20   [provider]  cyclobenzaprine (FLEXERIL) 10 MG tablet Take 10 mg by mouth 3 (three) times  daily as needed for muscle spasms. 07/11/19   [provider]  DULoxetine (CYMBALTA) 30 MG capsule Take 30 mg by mouth daily. 12/15/20   [provider]  folic acid (FOLVITE) 1 MG tablet Take 1 tablet (1 mg total) by mouth daily. Patient not taking: Reported on 06/09/2021 01/25/21   Roxan Hockey, MD  hydrOXYzine (ATARAX) 10 MG tablet Take 1 tablet (10 mg total) by mouth at bedtime. 09/07/21   Harvel Quale, MD  levETIRAcetam (KEPPRA) 750 MG tablet Take 750 mg by mouth 2 (two) times daily. 09/30/20   [provider]  mesalamine (APRISO) 0.375 g 24 hr capsule Take 4 capsules (1.5 g total) by mouth daily. Patient not taking: Reported on 08/17/2021 04/30/21   Rogene Houston, MD  nitroGLYCERIN (NITROSTAT) 0.4 MG SL tablet Place 1 tablet (0.4 mg total) under the tongue every 5 (five) minutes as needed for chest pain. 03/19/21   Rogene Houston, MD  omeprazole (PRILOSEC) 40 MG capsule Take 40 mg by mouth daily. 07/21/19   [provider]  ondansetron (ZOFRAN ODT) 8 MG disintegrating tablet Take 1 tablet (8 mg total) every 8 (eight) hours as needed by mouth for nausea or vomiting. 03/17/17   Evalee Jefferson, PA-C  potassium chloride SA (KLOR-CON) 20 MEQ tablet Take 1 tablet (20 mEq total) by mouth daily. Patient not taking: Reported on 06/09/2021 03/10/21   Daleen Bo, MD  promethazine (PHENERGAN) 25 MG tablet Take 1 tablet (25 mg total) by mouth every 6 (six) hours as needed for nausea or vomiting. 07/30/19   Noemi Chapel, MD  RESTASIS 0.05 % ophthalmic emulsion Place 1 drop into both eyes 2 (two) times daily. 12/12/20   [provider]  SPIRIVA RESPIMAT 2.5 MCG/ACT AERS Inhale 1 puff into the lungs daily as needed (shortness of breath). Patient taking differently: Inhale 1 puff into the lungs daily. 01/24/21   Roxan Hockey, MD    Physical Exam: BP (!) 97/53 (BP Location: Left Arm)   Pulse 99   Temp (!) 97.5 F (36.4 C) (Oral)   Resp 15   Ht 5' 6.5" (1.689 m)   Wt 54.4 kg   SpO2 97%   BMI 19.07 kg/m   General: 51 y.o. year-old female well developed well nourished in no acute distress.  Alert and oriented x3. HEENT: NCAT, EOMI Neck: Supple, trachea medial Cardiovascular: Regular rate and rhythm with no rubs or gallops.  No thyromegaly or JVD noted.  No lower extremity edema. 2/4 pulses in all 4 extremities. Respiratory: Clear to auscultation with no wheezes or rales. Good inspiratory effort. Abdomen: Soft, nontender nondistended with normal bowel sounds x4 quadrants. Muskuloskeletal: No cyanosis, clubbing or edema noted bilaterally Neuro: CN II-XII intact, strength 5/5 x 4, sensation, reflexes intact Skin: Superficial scattered erythematous rash noted in the extremities.  No ulcerative lesions noted Psychiatry: Mood is appropriate for condition and setting          Labs on Admission:  Basic Metabolic Panel: Recent Labs  Lab 10/01/21 1527  NA 134*  K 2.3*  CL 93*  CO2 31  GLUCOSE 101*   BUN 7  CREATININE 0.93  CALCIUM 7.9*  MG 2.2   Liver Function Tests: Recent Labs  Lab 10/01/21 1527  AST 24  ALT 21  ALKPHOS 132*  BILITOT 0.5  PROT 5.6*  ALBUMIN 2.1*   No results for input(s): LIPASE, AMYLASE in the last 168 hours. Recent Labs  Lab 10/01/21 1527  AMMONIA 16  CBC: Recent Labs  Lab 10/01/21 1527  WBC 9.2  HGB 12.2  HCT 36.7  MCV 92.9  PLT 410*   Cardiac Enzymes: No results for input(s): CKTOTAL, CKMB, CKMBINDEX, TROPONINI in the last 168 hours.  BNP (last 3 results) No results for input(s): BNP in the last 8760 hours.  ProBNP (last 3 results) No results for input(s): PROBNP in the last 8760 hours.  CBG: Recent Labs  Lab 10/01/21 1459  GLUCAP 100*    Radiological Exams on Admission: CT Head Wo Contrast  Result Date: 10/01/2021 CLINICAL DATA:  Provided history: Polytrauma, blunt. Mental status change, unknown cause. EXAM: CT HEAD WITHOUT CONTRAST CT CERVICAL SPINE WITHOUT CONTRAST TECHNIQUE: Multidetector CT imaging of the head and cervical spine was performed following the standard protocol without intravenous contrast. Multiplanar CT image reconstructions of the cervical spine were also generated. RADIATION DOSE REDUCTION: This exam was performed according to the departmental dose-optimization program which includes automated exposure control, adjustment of the mA and/or kV according to patient size and/or use of iterative reconstruction technique. COMPARISON:  Prior head CT examinations 02/16/2021 and earlier. Cervical spine radiographs 01/14/2014 FINDINGS: CT HEAD FINDINGS Brain: No age advanced or lobar predominant parenchymal atrophy. There is no acute intracranial hemorrhage. No demarcated cortical infarct. No extra-axial fluid collection. No evidence of an intracranial mass. No midline shift. Vascular: No hyperdense vessel. Skull: No fracture or aggressive osseous lesion. Sinuses/Orbits: No mass or acute finding within the imaged orbits.  Minimal mucosal thickening scattered within the bilateral ethmoid sinuses. Mild mucosal thickening versus small mucous retention cyst within the right sphenoid sinus. CT CERVICAL SPINE FINDINGS Alignment: Reversal of the expected cervical lordosis. Trace C3-C4 grade 1 anterolisthesis. Skull base and vertebrae: The basion-dental and atlanto-dental intervals are maintained.No evidence of acute fracture to the cervical spine. Soft tissues and spinal canal: No prevertebral fluid or swelling. No visible canal hematoma. Disc levels: Cervical spondylosis with multilevel disc space narrowing, disc bulges, posterior disc osteophytes, endplate spurring and uncovertebral hypertrophy. Disc space narrowing is greatest at C5-C6 (moderate to advanced) and C6-C7 (advanced). A posterior disc osteophyte at C6-C7 contributes to apparent moderate spinal canal stenosis. Bony neural foraminal narrowing bilaterally at C5-C6 and C6-C7. Upper chest: No consolidation within the imaged lung apices. No visible pneumothorax. IMPRESSION: Head CT: 1. No evidence of acute intracranial abnormality. 2. Mild paranasal sinus disease, as described. CT cervical spine: 1. No evidence of acute fracture to the cervical spine. 2. Nonspecific reversal of the expected cervical lordosis. 3. Trace C3-C4 grade 1 anterolisthesis. 4. Cervical spondylosis, as described. Electronically Signed   By: Kellie Simmering D.O.   On: 10/01/2021 16:08   CT Cervical Spine Wo Contrast  Result Date: 10/01/2021 CLINICAL DATA:  Provided history: Polytrauma, blunt. Mental status change, unknown cause. EXAM: CT HEAD WITHOUT CONTRAST CT CERVICAL SPINE WITHOUT CONTRAST TECHNIQUE: Multidetector CT imaging of the head and cervical spine was performed following the standard protocol without intravenous contrast. Multiplanar CT image reconstructions of the cervical spine were also generated. RADIATION DOSE REDUCTION: This exam was performed according to the departmental dose-optimization  program which includes automated exposure control, adjustment of the mA and/or kV according to patient size and/or use of iterative reconstruction technique. COMPARISON:  Prior head CT examinations 02/16/2021 and earlier. Cervical spine radiographs 01/14/2014 FINDINGS: CT HEAD FINDINGS Brain: No age advanced or lobar predominant parenchymal atrophy. There is no acute intracranial hemorrhage. No demarcated cortical infarct. No extra-axial fluid collection. No evidence of an intracranial mass. No midline shift. Vascular:  No hyperdense vessel. Skull: No fracture or aggressive osseous lesion. Sinuses/Orbits: No mass or acute finding within the imaged orbits. Minimal mucosal thickening scattered within the bilateral ethmoid sinuses. Mild mucosal thickening versus small mucous retention cyst within the right sphenoid sinus. CT CERVICAL SPINE FINDINGS Alignment: Reversal of the expected cervical lordosis. Trace C3-C4 grade 1 anterolisthesis. Skull base and vertebrae: The basion-dental and atlanto-dental intervals are maintained.No evidence of acute fracture to the cervical spine. Soft tissues and spinal canal: No prevertebral fluid or swelling. No visible canal hematoma. Disc levels: Cervical spondylosis with multilevel disc space narrowing, disc bulges, posterior disc osteophytes, endplate spurring and uncovertebral hypertrophy. Disc space narrowing is greatest at C5-C6 (moderate to advanced) and C6-C7 (advanced). A posterior disc osteophyte at C6-C7 contributes to apparent moderate spinal canal stenosis. Bony neural foraminal narrowing bilaterally at C5-C6 and C6-C7. Upper chest: No consolidation within the imaged lung apices. No visible pneumothorax. IMPRESSION: Head CT: 1. No evidence of acute intracranial abnormality. 2. Mild paranasal sinus disease, as described. CT cervical spine: 1. No evidence of acute fracture to the cervical spine. 2. Nonspecific reversal of the expected cervical lordosis. 3. Trace C3-C4 grade 1  anterolisthesis. 4. Cervical spondylosis, as described. Electronically Signed   By: Kellie Simmering D.O.   On: 10/01/2021 16:08    EKG: I independently viewed the EKG done and my findings are as followed: Sinus tachycardia at rate of 110 bpm with incomplete RBBB  Assessment/Plan Present on Admission:  Altered mental status  TOBACCO ABUSE  GERD (gastroesophageal reflux disease)  Crohn's disease ([place])  COPD (chronic obstructive pulmonary disease) (HCC)  Principal Problem:   Altered mental status Active Problems:   TOBACCO ABUSE   GERD (gastroesophageal reflux disease)   COPD (chronic obstructive pulmonary disease) (HCC)   Crohn's disease (HCC)   Seizure (HCC)   Skin rash   Hypokalemia   Hypoalbuminemia due to protein-calorie malnutrition (HCC)   Thrombocytosis  Altered mental status The cause of patient's altered mental status is currently unknown Urine drug screen was positive for benzodiazepine and THC.  Patient takes Xanax at home for anxiety CNS drugs except medication for seizures will be held at this time so as to rule out possible polypharmacy Continue fall precaution and neurochecks  Hypokalemia K+ is 2.3 K+ will be replenished Please monitor for AM K+ for further replenishmemnt  Hypoalbuminemia possibly secondary to moderate protein calorie malnutrition Albumin 2.1; protein supplement will be provided  Reported history of falls This may be related to patient's home meds including Xanax which she takes every 8 hours, this will be temporarily held at this time. Continue fall precaution and neurochecks Continue PT/OT eval and treat  Chronic thrombocytosis  Platelets 410, no obvious sign of bleeding Continue to monitor platelet levels labs next  Drug abuse Urine drug screen was positive for benzodiazepine (patient takes Xanax at home) Urine drug screen was also positive for [facility] Patient counseled on drug abuse cessation  Crohn's disease Patient follows with Dr.  Laural Golden and Dr. Jenetta Downer.  Last office visit was on 09/07/2021 with Dr. Jenetta Downer Last colonoscopy done on 07/15/2021 by Dr. Laural Golden showed inflammation in the distal sigmoid colon and rectum which was severe because he was large amount of stool in her colon precluding visualization.  There was also presence of noncritical rectal stricture and the rectal exam.  Biopsies showed chronic active colitis with ulceration and necroinflammatory debris.  Skin rash This is possibly secondary to skin reaction to Humira Continue Kenalog  GERD Continue Protonix  COPD Continue Atrovent as needed  Seizures Continue Keppra  Tobacco abuse Patient was counseled on tobacco abuse cessation Nicotine patch provided  DVT prophylaxis: Lovenox   Advance Care Planning:   Code Status: Prior   Consults: None  Family Communication: 2 Friends (tenants) at bedside (all questions answered to satisfaction)  Severity of Illness: The appropriate patient status for this patient is OBSERVATION. Observation status is judged to be reasonable and necessary in order to provide the required intensity of service to ensure the patient's safety. The patient's presenting symptoms, physical exam findings, and initial radiographic and laboratory data in the context of their medical condition is felt to place them at decreased risk for further clinical deterioration. Furthermore, it is anticipated that the patient will be medically stable for discharge from the hospital within 2 midnights of admission.   Author: Bernadette Hoit, DO 10/02/2021 12:00 AM  For on call review [email or web address].

## 2021-10-01 NOTE — ED Provider Notes (Signed)
[facility] Provider Note   CSN: [id number] Arrival date & time: 10/01/21  1449     History  Chief Complaint  Patient presents with   Altered Mental Status    [name] is a 51 y.o. female.  HPI Patient presenting from home by private vehicle for unclear reason.  She is disoriented.  She gives a rambling history and its not clear exactly what is happening to her.    Home Medications Prior to Admission medications   Medication Sig Start Date End Date Taking? Authorizing Provider  albuterol (VENTOLIN HFA) 108 (90 Base) MCG/ACT inhaler Inhale 2 puffs into the lungs every 4 (four) hours as needed for wheezing or shortness of breath. 01/24/21   Roxan Hockey, MD  ALPRAZolam [name]) 1 MG tablet Take 1 mg by mouth 3 (three) times daily as needed for anxiety. 07/11/19   [provider]  amitriptyline (ELAVIL) 75 MG tablet Take 75 mg by mouth at bedtime. 12/16/20   [provider]  cyclobenzaprine (FLEXERIL) 10 MG tablet Take 10 mg by mouth 3 (three) times daily as needed for muscle spasms. 07/11/19   [provider]  DULoxetine (CYMBALTA) 30 MG capsule Take 30 mg by mouth daily. 12/15/20   [provider]  folic acid (FOLVITE) 1 MG tablet Take 1 tablet (1 mg total) by mouth daily. Patient not taking: Reported on 06/09/2021 01/25/21   Roxan Hockey, MD  hydrOXYzine (ATARAX) 10 MG tablet Take 1 tablet (10 mg total) by mouth at bedtime. 09/07/21   Harvel Quale, MD  levETIRAcetam (KEPPRA) 750 MG tablet Take 750 mg by mouth 2 (two) times daily. 09/30/20   [provider]  mesalamine (APRISO) 0.375 g 24 hr capsule Take 4 capsules (1.5 g total) by mouth daily. Patient not taking: Reported on 08/17/2021 04/30/21   Rogene Houston, MD  nitroGLYCERIN (NITROSTAT) 0.4 MG SL tablet Place 1 tablet (0.4 mg total) under the tongue every 5 (five) minutes as needed for chest pain. 03/19/21   Rogene Houston, MD  omeprazole  (PRILOSEC) 40 MG capsule Take 40 mg by mouth daily. 07/21/19   [provider]  ondansetron (ZOFRAN ODT) 8 MG disintegrating tablet Take 1 tablet (8 mg total) every 8 (eight) hours as needed by mouth for nausea or vomiting. 03/17/17   Evalee Jefferson, PA-C  potassium chloride SA (KLOR-CON) 20 MEQ tablet Take 1 tablet (20 mEq total) by mouth daily. Patient not taking: Reported on 06/09/2021 03/10/21   Daleen Bo, MD  promethazine (PHENERGAN) 25 MG tablet Take 1 tablet (25 mg total) by mouth every 6 (six) hours as needed for nausea or vomiting. 07/30/19   Noemi Chapel, MD  RESTASIS 0.05 % ophthalmic emulsion Place 1 drop into both eyes 2 (two) times daily. 12/12/20   [provider]  SPIRIVA RESPIMAT 2.5 MCG/ACT AERS Inhale 1 puff into the lungs daily as needed (shortness of breath). Patient taking differently: Inhale 1 puff into the lungs daily. 01/24/21   Roxan Hockey, MD      Allergies    Codeine, Dilaudid [hydromorphone hcl], Hydrocodone, Morphine and related, Oxycodone, Penicillins, Acetaminophen, Ativan [lorazepam], Strawberry extract, Watermelon concentrate, Albuterol, Benadryl [diphenhydramine hcl], Latex, and Remicade [infliximab]    Review of Systems   Review of Systems  Physical Exam Updated Vital Signs BP 100/67   Pulse 95   Temp 97.8 F (36.6 C) (Oral)   Resp 15   Ht 5' 6.5" (1.689 m)   Wt 54.4 kg  SpO2 95%   BMI 19.07 kg/m  Physical Exam Vitals and nursing note reviewed.  Constitutional:      General: She is in acute distress (Confused unable to give history).     Appearance: She is well-developed. She is not ill-appearing or diaphoretic.  HENT:     Head: Normocephalic.     Comments: Left periorbital ecchymosis without significant swelling or crepitation.    Right Ear: External ear normal.     Left Ear: External ear normal.     Nose: No congestion or rhinorrhea.     Mouth/Throat:     Mouth: Mucous membranes are moist.     Pharynx: No posterior  oropharyngeal erythema.  Eyes:     Conjunctiva/sclera: Conjunctivae normal.     Pupils: Pupils are equal, round, and reactive to light.  Neck:     Trachea: Phonation normal.  Cardiovascular:     Rate and Rhythm: Normal rate and regular rhythm.  Pulmonary:     Effort: Pulmonary effort is normal.     Breath sounds: Normal breath sounds.  Chest:     Chest wall: No tenderness.  Abdominal:     General: There is no distension.     Palpations: Abdomen is soft.     Tenderness: There is no abdominal tenderness. There is no guarding.  Musculoskeletal:        General: Normal range of motion.     Cervical back: Normal range of motion and neck supple.  Skin:    General: Skin is warm and dry.     Comments: Scattered superficial red rash, irregular, with areas of sloughing skin.  Lesions range from 0.5 cm to 5 cm.  The lesions are all flat.  There is no associated drainage or bleeding or fluctuance with these lesions.  Neurological:     Mental Status: She is alert.     Motor: No abnormal muscle tone.     Comments: Dysarthric.  No facial asymmetry.  Normal grip strength bilaterally.  No nystagmus or ataxia.  No aphasia.  Psychiatric:        Mood and Affect: Mood normal.        Behavior: Behavior normal.        Thought Content: Thought content normal.        Judgment: Judgment normal.    ED Results / Procedures / Treatments   Labs (all labs ordered are listed, but only abnormal results are displayed) Labs Reviewed  COMPREHENSIVE METABOLIC PANEL - Abnormal; Notable for the following components:      Result Value   Sodium 134 (*)    Potassium 2.3 (*)    Chloride 93 (*)    Glucose, Bld 101 (*)    Calcium 7.9 (*)    Total Protein 5.6 (*)    Albumin 2.1 (*)    Alkaline Phosphatase 132 (*)    All other components within normal limits  CBC - Abnormal; Notable for the following components:   RDW 17.2 (*)    Platelets 410 (*)    All other components within normal limits  RAPID URINE DRUG  SCREEN, HOSP PERFORMED - Abnormal; Notable for the following components:   Benzodiazepines POSITIVE (*)    Tetrahydrocannabinol POSITIVE (*)    All other components within normal limits  URINALYSIS, ROUTINE W REFLEX MICROSCOPIC - Abnormal; Notable for the following components:   Specific Gravity, Urine 1.004 (*)    Leukocytes,Ua MODERATE (*)    Bacteria, UA RARE (*)    All other  components within normal limits  CBG MONITORING, ED - Abnormal; Notable for the following components:   Glucose-Capillary 100 (*)    All other components within normal limits  AMMONIA  ETHANOL  MAGNESIUM  CBG MONITORING, ED    EKG EKG Interpretation  Date/Time:  Thursday October 01 2021 15:10:43 EDT Ventricular Rate:  110 PR Interval:  152 QRS Duration: 112 QT Interval:  329 QTC Calculation: 445 R Axis:   80 Text Interpretation: Sinus tachycardia Left atrial enlargement Incomplete right bundle branch block Low voltage, precordial leads Nonspecific T abnormalities, lateral leads since last tracing no significant change Confirmed by Daleen Bo [phone]) on 10/01/2021 3:16:02 PM  Radiology CT Head Wo Contrast  Result Date: 10/01/2021 CLINICAL DATA:  Provided history: Polytrauma, blunt. Mental status change, unknown cause. EXAM: CT HEAD WITHOUT CONTRAST CT CERVICAL SPINE WITHOUT CONTRAST TECHNIQUE: Multidetector CT imaging of the head and cervical spine was performed following the standard protocol without intravenous contrast. Multiplanar CT image reconstructions of the cervical spine were also generated. RADIATION DOSE REDUCTION: This exam was performed according to the departmental dose-optimization program which includes automated exposure control, adjustment of the mA and/or kV according to patient size and/or use of iterative reconstruction technique. COMPARISON:  Prior head CT examinations 02/16/2021 and earlier. Cervical spine radiographs 01/14/2014 FINDINGS: CT HEAD FINDINGS Brain: No age advanced or lobar  predominant parenchymal atrophy. There is no acute intracranial hemorrhage. No demarcated cortical infarct. No extra-axial fluid collection. No evidence of an intracranial mass. No midline shift. Vascular: No hyperdense vessel. Skull: No fracture or aggressive osseous lesion. Sinuses/Orbits: No mass or acute finding within the imaged orbits. Minimal mucosal thickening scattered within the bilateral ethmoid sinuses. Mild mucosal thickening versus small mucous retention cyst within the right sphenoid sinus. CT CERVICAL SPINE FINDINGS Alignment: Reversal of the expected cervical lordosis. Trace C3-C4 grade 1 anterolisthesis. Skull base and vertebrae: The basion-dental and atlanto-dental intervals are maintained.No evidence of acute fracture to the cervical spine. Soft tissues and spinal canal: No prevertebral fluid or swelling. No visible canal hematoma. Disc levels: Cervical spondylosis with multilevel disc space narrowing, disc bulges, posterior disc osteophytes, endplate spurring and uncovertebral hypertrophy. Disc space narrowing is greatest at C5-C6 (moderate to advanced) and C6-C7 (advanced). A posterior disc osteophyte at C6-C7 contributes to apparent moderate spinal canal stenosis. Bony neural foraminal narrowing bilaterally at C5-C6 and C6-C7. Upper chest: No consolidation within the imaged lung apices. No visible pneumothorax. IMPRESSION: Head CT: 1. No evidence of acute intracranial abnormality. 2. Mild paranasal sinus disease, as described. CT cervical spine: 1. No evidence of acute fracture to the cervical spine. 2. Nonspecific reversal of the expected cervical lordosis. 3. Trace C3-C4 grade 1 anterolisthesis. 4. Cervical spondylosis, as described. Electronically Signed   By: Kellie Simmering D.O.   On: 10/01/2021 16:08   CT Cervical Spine Wo Contrast  Result Date: 10/01/2021 CLINICAL DATA:  Provided history: Polytrauma, blunt. Mental status change, unknown cause. EXAM: CT HEAD WITHOUT CONTRAST CT CERVICAL  SPINE WITHOUT CONTRAST TECHNIQUE: Multidetector CT imaging of the head and cervical spine was performed following the standard protocol without intravenous contrast. Multiplanar CT image reconstructions of the cervical spine were also generated. RADIATION DOSE REDUCTION: This exam was performed according to the departmental dose-optimization program which includes automated exposure control, adjustment of the mA and/or kV according to patient size and/or use of iterative reconstruction technique. COMPARISON:  Prior head CT examinations 02/16/2021 and earlier. Cervical spine radiographs 01/14/2014 FINDINGS: CT HEAD FINDINGS Brain: No age advanced or  lobar predominant parenchymal atrophy. There is no acute intracranial hemorrhage. No demarcated cortical infarct. No extra-axial fluid collection. No evidence of an intracranial mass. No midline shift. Vascular: No hyperdense vessel. Skull: No fracture or aggressive osseous lesion. Sinuses/Orbits: No mass or acute finding within the imaged orbits. Minimal mucosal thickening scattered within the bilateral ethmoid sinuses. Mild mucosal thickening versus small mucous retention cyst within the right sphenoid sinus. CT CERVICAL SPINE FINDINGS Alignment: Reversal of the expected cervical lordosis. Trace C3-C4 grade 1 anterolisthesis. Skull base and vertebrae: The basion-dental and atlanto-dental intervals are maintained.No evidence of acute fracture to the cervical spine. Soft tissues and spinal canal: No prevertebral fluid or swelling. No visible canal hematoma. Disc levels: Cervical spondylosis with multilevel disc space narrowing, disc bulges, posterior disc osteophytes, endplate spurring and uncovertebral hypertrophy. Disc space narrowing is greatest at C5-C6 (moderate to advanced) and C6-C7 (advanced). A posterior disc osteophyte at C6-C7 contributes to apparent moderate spinal canal stenosis. Bony neural foraminal narrowing bilaterally at C5-C6 and C6-C7. Upper chest: No  consolidation within the imaged lung apices. No visible pneumothorax. IMPRESSION: Head CT: 1. No evidence of acute intracranial abnormality. 2. Mild paranasal sinus disease, as described. CT cervical spine: 1. No evidence of acute fracture to the cervical spine. 2. Nonspecific reversal of the expected cervical lordosis. 3. Trace C3-C4 grade 1 anterolisthesis. 4. Cervical spondylosis, as described. Electronically Signed   By: Kellie Simmering D.O.   On: 10/01/2021 16:08    Procedures Procedures    Medications Ordered in ED Medications  0.9 %  sodium chloride infusion ( Intravenous New Bag/Given 10/01/21 1540)  potassium chloride 10 mEq in 100 mL IVPB (10 mEq Intravenous New Bag/Given 10/01/21 2144)  potassium chloride SA (KLOR-CON M) CR tablet 40 mEq (40 mEq Oral Given 10/01/21 2016)  sodium chloride 0.9 % bolus 500 mL (500 mLs Intravenous New Bag/Given 10/01/21 2150)    ED Course/ Medical Decision Making/ A&P                           Medical Decision Making Patient presenting disoriented, with slurred speech.  She has chronic Crohn's disease and recently saw her gastroenterologist.  She also sees GI at [facility], and [facility] however has not had any recent visits with them even though she has scheduled appointments.  She has chronic ongoing dermatologic disorder.  Problems Addressed: Altered mental status, unspecified altered mental status type: acute illness or injury that poses a threat to life or bodily functions Hypokalemia: acute illness or injury that poses a threat to life or bodily functions  Amount and/or Complexity of Data Reviewed Independent Historian:     Details: She is mildly confused.  A friend of hers came to express his concern for her confusion.  He states that she sometimes hallucinates.  She lives with his friend in her home. External Data Reviewed: labs, radiology and notes.    Details: Ongoing evaluation by GI for Crohn's disease. Labs: ordered.    Details: CBC, metabolic  panel, ammonia level, alcohol level-normal except potassium low, sodium low, chloride low, glucose high, protein low, calcium low, albumin Radiology: ordered.    Details: CT head, C-spine-normal except degenerative joint disease cervical spine ECG/medicine tests: ordered.    Details: Cardiac monitor-sinus tachycardia Discussion of management or test interpretation with external provider(s): Consult hospitalist for admission due to hypokalemia and unspecified encephalopathy.  Risk Prescription drug management. Decision regarding hospitalization. Risk Details: Patient improved after treatment IV fluids.  She had less confusion and slurred speech.  Incidental hypokalemia, cause not clear.  No clear evidence for acute psychiatric condition and no evidence for stroke.  Suspect multifactorial encephalopathy.  Patient has apparently stable Crohn's disease for which she sees providers at multiple different medical centers.  Patient requires hospitalization for treatment of hypokalemia.  She may need neurology consultation for encephalopathy.  Critical Care Total time providing critical care: 45 minutes          Final Clinical Impression(s) / ED Diagnoses Final diagnoses:  Altered mental status, unspecified altered mental status type  Hypokalemia    Rx / DC Orders ED Discharge Orders     None         Daleen Bo, MD 10/01/21 2225

## 2021-10-01 NOTE — H&P (Incomplete)
History and Physical    Patient: [name] OVF:[id number] DOB: 1971-02-01 DOA: 10/01/2021 DOS: the patient was seen and examined on 10/02/2021 PCP: Teodoro Kil, PA-C  Patient coming from: Home  Chief Complaint:  Chief Complaint  Patient presents with  . Altered Mental Status   HPI: [name] is a 51 y.o. female with medical history significant of Crohn's disease, GERD, COPD, seizures who presents to the emergency department after being brought by private vehicle due to altered mental status.  Patient complained of lower abdominal pain and some rash in upper and lower extremities which she thought was related to Crohn's disease, but also states that it was related to falls.  Some of the history was obtained from friends (who happened to be her tenants), per report, patient has been having intermittent hallucinations and disorientation since October of last year (2022).  Patient was able to follow commands, but could not provide any further pertinent history as regards why she came to the ED.   ED Course:  In the emergency department, BP was soft at 93/68, she was initially tachycardic, but this has since normalized, other vital signs within normal range.  Work-up in the ED showed normal CBC except for thrombocytosis.  BMP showed hyponatremia, hypokalemia.  Magnesium 2.2.  Albumin 2.1, ALP 132, alcohol level was less than 10, ammonia 16, urine drug screen was positive for benzodiazepine and THC.  Urinalysis was unimpressive for UTI CT head without contrast showed no evidence of acute intracranial abnormality CT cervical spine without contrast showed no evidence of acute fracture to the cervical spine.  Nonspecific reversal of the expected cervical lordosis.  Trace C3-C4 grade 1 anterolisthesis IV hydration was provided, potassium was replenished  Review of Systems: Review of systems as noted in the HPI. All other systems reviewed and are negative.   Past Medical History:  Diagnosis  Date  . Anxiety   . Asthma   . Chronic low back pain   . Collagen vascular disease ([place])   . COPD (chronic obstructive pulmonary disease) ([place])   . Crohn's disease ([place])   . GERD (gastroesophageal reflux disease)    EGD 01/2007 by Dr.Rourke small hiatal hernia s/p 109 french maloney   . History of head injury   . Hypertension   . IBS (irritable bowel syndrome)   . PSVT (paroxysmal supraventricular tachycardia) ([place])   . PTSD (post-traumatic stress disorder)   . Recurrent chest pain   . Seizure disorder ([place])   . Type 2 diabetes mellitus ([place])    Past Surgical History:  Procedure Laterality Date  . ABDOMINAL HYSTERECTOMY     with right salpingo oophorectomy 2005  . APPENDECTOMY  2006  . BALLOON DILATION N/A 02/19/2021   Procedure: BALLOON DILATION;  Surgeon: Eloise Harman, DO;  [facility];  Service: Endoscopy;  Laterality: N/A;  Sigmoid colon stricture  . BIOPSY  02/06/2021   Procedure: BIOPSY;  Surgeon: Eloise Harman, DO;  [facility];  Service: Endoscopy;;  . BIOPSY  02/19/2021   Procedure: BIOPSY;  Surgeon: Eloise Harman, DO;  [facility];  Service: Endoscopy;;  . BIOPSY  07/15/2021   Procedure: BIOPSY;  Surgeon: Rogene Houston, MD;  [facility];  Service: Endoscopy;;  . [place]  . CHOLECYSTECTOMY    . COLONOSCOPY  2010   Dr. Gala Romney; negative except for hemorrhoids  . ESOPHAGEAL DILATION N/A 10/25/2014   Procedure: ESOPHAGEAL DILATION;  Surgeon:  Rogene Houston, MD;  [facility];  Service: Endoscopy;  Laterality: N/A;  . ESOPHAGOGASTRODUODENOSCOPY N/A 10/25/2014   Procedure: ESOPHAGOGASTRODUODENOSCOPY (EGD);  Surgeon: Rogene Houston, MD;  [facility];  Service: Endoscopy;  Laterality: N/A;  1250  . FLEXIBLE SIGMOIDOSCOPY N/A 02/06/2021   Procedure: FLEXIBLE SIGMOIDOSCOPY;  Surgeon: Eloise Harman, DO;  [facility];  Service: Endoscopy;  Laterality: N/A;  .  FLEXIBLE SIGMOIDOSCOPY N/A 02/19/2021   Procedure: FLEXIBLE SIGMOIDOSCOPY;  Surgeon: Eloise Harman, DO;  [facility];  Service: Endoscopy;  Laterality: N/A;  . OOPHORECTOMY     left for torsion and ovarian fibroma; uterine myoma resected 1995  . SIGMOIDOSCOPY  07/15/2021   Procedure: SIGMOIDOSCOPY;  Surgeon: Rogene Houston, MD;  [facility];  Service: Endoscopy;;    Social History:  reports that she has been smoking cigarettes. She started smoking about 37 years ago. She has a 10.00 pack-year smoking history. She has been exposed to tobacco smoke. She has never used smokeless tobacco. She reports that she does not currently use drugs. She reports that she does not drink alcohol.   Allergies  Allergen Reactions  . Codeine Shortness Of Breath, Swelling and Rash    Throat swelling  . Dilaudid [Hydromorphone Hcl] Shortness Of Breath and Swelling  . Hydrocodone Shortness Of Breath, Swelling and Rash  . Morphine And Related Shortness Of Breath, Swelling and Rash  . Oxycodone Shortness Of Breath, Swelling and Rash    Patient states ALL pain medications make her deathly sick.  . Penicillins Shortness Of Breath, Swelling and Other (See Comments)    Has patient had a PCN reaction causing immediate rash, facial/tongue/throat swelling, SOB or lightheadedness with hypotension: Yes Has patient had a PCN reaction causing severe rash involving mucus membranes or skin necrosis: Yes Has patient had a PCN reaction that required hospitalization Yes Has patient had a PCN reaction occurring within the last 10 years: No If all of the above answers are "NO", then may proceed with Cephalosporin use.   Throat swells  02/06/21--TOLERATES CEFTRIAXONE    . Acetaminophen     Per MD patient states she can't take Tylenol because of liver enzymes  . Ativan [Lorazepam] Other (See Comments)    Migraines.  . Strawberry Extract Swelling  . Watermelon Concentrate Swelling  . Albuterol  Palpitations  . Benadryl [Diphenhydramine Hcl] Palpitations  . Latex Rash  . Remicade [Infliximab] Rash    Blisters and Welts     Family History  Problem Relation Age of Onset  . Cancer Mother   . Heart failure Mother   . Hypertension Mother   . Heart attack Mother   . Heart attack Father 49  . Cancer Father 54  . Heart failure Father   . Diabetes Father   . Crohn's disease Cousin      Prior to Admission medications   Medication Sig Start Date End Date Taking? Authorizing Provider  albuterol (VENTOLIN HFA) 108 (90 Base) MCG/ACT inhaler Inhale 2 puffs into the lungs every 4 (four) hours as needed for wheezing or shortness of breath. 01/24/21   Roxan Hockey, MD  ALPRAZolam [name]) 1 MG tablet Take 1 mg by mouth 3 (three) times daily as needed for anxiety. 07/11/19   [provider]  amitriptyline (ELAVIL) 75 MG tablet Take 75 mg by mouth at bedtime. 12/16/20   [provider]  cyclobenzaprine (FLEXERIL) 10 MG tablet Take 10 mg by mouth 3 (three) times  daily as needed for muscle spasms. 07/11/19   [provider]  DULoxetine (CYMBALTA) 30 MG capsule Take 30 mg by mouth daily. 12/15/20   [provider]  folic acid (FOLVITE) 1 MG tablet Take 1 tablet (1 mg total) by mouth daily. Patient not taking: Reported on 06/09/2021 01/25/21   Roxan Hockey, MD  hydrOXYzine (ATARAX) 10 MG tablet Take 1 tablet (10 mg total) by mouth at bedtime. 09/07/21   Harvel Quale, MD  levETIRAcetam (KEPPRA) 750 MG tablet Take 750 mg by mouth 2 (two) times daily. 09/30/20   [provider]  mesalamine (APRISO) 0.375 g 24 hr capsule Take 4 capsules (1.5 g total) by mouth daily. Patient not taking: Reported on 08/17/2021 04/30/21   Rogene Houston, MD  nitroGLYCERIN (NITROSTAT) 0.4 MG SL tablet Place 1 tablet (0.4 mg total) under the tongue every 5 (five) minutes as needed for chest pain. 03/19/21   Rogene Houston, MD  omeprazole (PRILOSEC) 40 MG capsule  Take 40 mg by mouth daily. 07/21/19   [provider]  ondansetron (ZOFRAN ODT) 8 MG disintegrating tablet Take 1 tablet (8 mg total) every 8 (eight) hours as needed by mouth for nausea or vomiting. 03/17/17   Evalee Jefferson, PA-C  potassium chloride SA (KLOR-CON) 20 MEQ tablet Take 1 tablet (20 mEq total) by mouth daily. Patient not taking: Reported on 06/09/2021 03/10/21   Daleen Bo, MD  promethazine (PHENERGAN) 25 MG tablet Take 1 tablet (25 mg total) by mouth every 6 (six) hours as needed for nausea or vomiting. 07/30/19   Noemi Chapel, MD  RESTASIS 0.05 % ophthalmic emulsion Place 1 drop into both eyes 2 (two) times daily. 12/12/20   [provider]  SPIRIVA RESPIMAT 2.5 MCG/ACT AERS Inhale 1 puff into the lungs daily as needed (shortness of breath). Patient taking differently: Inhale 1 puff into the lungs daily. 01/24/21   Roxan Hockey, MD    Physical Exam: BP (!) 97/53 (BP Location: Left Arm)   Pulse 99   Temp (!) 97.5 F (36.4 C) (Oral)   Resp 15   Ht 5' 6.5" (1.689 m)   Wt 54.4 kg   SpO2 97%   BMI 19.07 kg/m   General: 51 y.o. year-old female well developed well nourished in no acute distress.  Alert and oriented x3. HEENT: NCAT, EOMI Neck: Supple, trachea medial Cardiovascular: Regular rate and rhythm with no rubs or gallops.  No thyromegaly or JVD noted.  No lower extremity edema. 2/4 pulses in all 4 extremities. Respiratory: Clear to auscultation with no wheezes or rales. Good inspiratory effort. Abdomen: Soft, nontender nondistended with normal bowel sounds x4 quadrants. Muskuloskeletal: No cyanosis, clubbing or edema noted bilaterally Neuro: CN II-XII intact, strength 5/5 x 4, sensation, reflexes intact Skin: Superficial scattered erythematous rash noted in the extremities.  No ulcerative lesions noted Psychiatry: Mood is appropriate for condition and setting          Labs on Admission:  Basic Metabolic Panel: Recent Labs  Lab 10/01/21 1527  NA  134*  K 2.3*  CL 93*  CO2 31  GLUCOSE 101*  BUN 7  CREATININE 0.93  CALCIUM 7.9*  MG 2.2   Liver Function Tests: Recent Labs  Lab 10/01/21 1527  AST 24  ALT 21  ALKPHOS 132*  BILITOT 0.5  PROT 5.6*  ALBUMIN 2.1*   No results for input(s): LIPASE, AMYLASE in the last 168 hours. Recent Labs  Lab 10/01/21 1527  AMMONIA 16  CBC: Recent Labs  Lab 10/01/21 1527  WBC 9.2  HGB 12.2  HCT 36.7  MCV 92.9  PLT 410*   Cardiac Enzymes: No results for input(s): CKTOTAL, CKMB, CKMBINDEX, TROPONINI in the last 168 hours.  BNP (last 3 results) No results for input(s): BNP in the last 8760 hours.  ProBNP (last 3 results) No results for input(s): PROBNP in the last 8760 hours.  CBG: Recent Labs  Lab 10/01/21 1459  GLUCAP 100*    Radiological Exams on Admission: CT Head Wo Contrast  Result Date: 10/01/2021 CLINICAL DATA:  Provided history: Polytrauma, blunt. Mental status change, unknown cause. EXAM: CT HEAD WITHOUT CONTRAST CT CERVICAL SPINE WITHOUT CONTRAST TECHNIQUE: Multidetector CT imaging of the head and cervical spine was performed following the standard protocol without intravenous contrast. Multiplanar CT image reconstructions of the cervical spine were also generated. RADIATION DOSE REDUCTION: This exam was performed according to the departmental dose-optimization program which includes automated exposure control, adjustment of the mA and/or kV according to patient size and/or use of iterative reconstruction technique. COMPARISON:  Prior head CT examinations 02/16/2021 and earlier. Cervical spine radiographs 01/14/2014 FINDINGS: CT HEAD FINDINGS Brain: No age advanced or lobar predominant parenchymal atrophy. There is no acute intracranial hemorrhage. No demarcated cortical infarct. No extra-axial fluid collection. No evidence of an intracranial mass. No midline shift. Vascular: No hyperdense vessel. Skull: No fracture or aggressive osseous lesion. Sinuses/Orbits: No mass  or acute finding within the imaged orbits. Minimal mucosal thickening scattered within the bilateral ethmoid sinuses. Mild mucosal thickening versus small mucous retention cyst within the right sphenoid sinus. CT CERVICAL SPINE FINDINGS Alignment: Reversal of the expected cervical lordosis. Trace C3-C4 grade 1 anterolisthesis. Skull base and vertebrae: The basion-dental and atlanto-dental intervals are maintained.No evidence of acute fracture to the cervical spine. Soft tissues and spinal canal: No prevertebral fluid or swelling. No visible canal hematoma. Disc levels: Cervical spondylosis with multilevel disc space narrowing, disc bulges, posterior disc osteophytes, endplate spurring and uncovertebral hypertrophy. Disc space narrowing is greatest at C5-C6 (moderate to advanced) and C6-C7 (advanced). A posterior disc osteophyte at C6-C7 contributes to apparent moderate spinal canal stenosis. Bony neural foraminal narrowing bilaterally at C5-C6 and C6-C7. Upper chest: No consolidation within the imaged lung apices. No visible pneumothorax. IMPRESSION: Head CT: 1. No evidence of acute intracranial abnormality. 2. Mild paranasal sinus disease, as described. CT cervical spine: 1. No evidence of acute fracture to the cervical spine. 2. Nonspecific reversal of the expected cervical lordosis. 3. Trace C3-C4 grade 1 anterolisthesis. 4. Cervical spondylosis, as described. Electronically Signed   By: Kellie Simmering D.O.   On: 10/01/2021 16:08   CT Cervical Spine Wo Contrast  Result Date: 10/01/2021 CLINICAL DATA:  Provided history: Polytrauma, blunt. Mental status change, unknown cause. EXAM: CT HEAD WITHOUT CONTRAST CT CERVICAL SPINE WITHOUT CONTRAST TECHNIQUE: Multidetector CT imaging of the head and cervical spine was performed following the standard protocol without intravenous contrast. Multiplanar CT image reconstructions of the cervical spine were also generated. RADIATION DOSE REDUCTION: This exam was performed  according to the departmental dose-optimization program which includes automated exposure control, adjustment of the mA and/or kV according to patient size and/or use of iterative reconstruction technique. COMPARISON:  Prior head CT examinations 02/16/2021 and earlier. Cervical spine radiographs 01/14/2014 FINDINGS: CT HEAD FINDINGS Brain: No age advanced or lobar predominant parenchymal atrophy. There is no acute intracranial hemorrhage. No demarcated cortical infarct. No extra-axial fluid collection. No evidence of an intracranial mass. No midline shift. Vascular:  No hyperdense vessel. Skull: No fracture or aggressive osseous lesion. Sinuses/Orbits: No mass or acute finding within the imaged orbits. Minimal mucosal thickening scattered within the bilateral ethmoid sinuses. Mild mucosal thickening versus small mucous retention cyst within the right sphenoid sinus. CT CERVICAL SPINE FINDINGS Alignment: Reversal of the expected cervical lordosis. Trace C3-C4 grade 1 anterolisthesis. Skull base and vertebrae: The basion-dental and atlanto-dental intervals are maintained.No evidence of acute fracture to the cervical spine. Soft tissues and spinal canal: No prevertebral fluid or swelling. No visible canal hematoma. Disc levels: Cervical spondylosis with multilevel disc space narrowing, disc bulges, posterior disc osteophytes, endplate spurring and uncovertebral hypertrophy. Disc space narrowing is greatest at C5-C6 (moderate to advanced) and C6-C7 (advanced). A posterior disc osteophyte at C6-C7 contributes to apparent moderate spinal canal stenosis. Bony neural foraminal narrowing bilaterally at C5-C6 and C6-C7. Upper chest: No consolidation within the imaged lung apices. No visible pneumothorax. IMPRESSION: Head CT: 1. No evidence of acute intracranial abnormality. 2. Mild paranasal sinus disease, as described. CT cervical spine: 1. No evidence of acute fracture to the cervical spine. 2. Nonspecific reversal of the  expected cervical lordosis. 3. Trace C3-C4 grade 1 anterolisthesis. 4. Cervical spondylosis, as described. Electronically Signed   By: Kellie Simmering D.O.   On: 10/01/2021 16:08    EKG: I independently viewed the EKG done and my findings are as followed: Sinus tachycardia at rate of 110 bpm with incomplete RBBB  Assessment/Plan Present on Admission: . Altered mental status . TOBACCO ABUSE . GERD (gastroesophageal reflux disease) . Crohn's disease ([place]) . COPD (chronic obstructive pulmonary disease) ([facility])  Principal Problem:   Altered mental status Active Problems:   TOBACCO ABUSE   GERD (gastroesophageal reflux disease)   COPD (chronic obstructive pulmonary disease) ([facility])   Crohn's disease ([facility])   Seizure ([facility])   Skin rash   Hypokalemia   Hypoalbuminemia due to protein-calorie malnutrition ([facility])   Thrombocytosis  Altered mental status The cause of patient's altered mental status is currently unknown Urine drug screen was positive for benzodiazepine and THC.  Patient takes Xanax at home for anxiety CNS drugs except medication for seizures will be held at this time so as to rule out possible polypharmacy Continue fall precaution and neurochecks  Hypokalemia K+ is 2.3 K+ will be replenished Please monitor for AM K+ for further replenishmemnt  Hypoalbuminemia possibly secondary to moderate protein calorie malnutrition Albumin 2.1; protein supplement will be provided  Reported history of falls This may be related to patient's home meds including Xanax which she takes every 8 hours, this will be temporarily held at this time  Chronic thrombocytosis  Platelets 410, no obvious sign of bleeding Continue to monitor platelet levels labs next  Drug abuse Urine drug screen was positive for benzodiazepine (patient takes Xanax at home) Urine drug screen was also positive for [facility] Patient counseled on drug abuse cessation  Crohn's disease Patient follows with Dr. Laural Golden and Dr.  Jenetta Downer.  Last office visit was on 09/07/2021 with Dr. Jenetta Downer Last colonoscopy done on 07/15/2021 by Dr. Laural Golden showed inflammation in the distal sigmoid colon and rectum which was severe because he was large amount of stool in her colon precluding visualization.  There was also presence of noncritical rectal stricture and the rectal exam.  Biopsies showed chronic active colitis with ulceration and necroinflammatory debris.  Skin rash This is possibly secondary to skin reaction to Humira Continue Kenalog  GERD Continue Protonix  COPD Continue Atrovent as needed  Seizures Continue Keppra  Tobacco abuse Patient was counseled on tobacco abuse cessation Nicotine patch provided  DVT prophylaxis: Lovenox   Advance Care Planning:   Code Status: Prior   Consults: None  Family Communication: 2 Friends (tenants) at bedside (all questions answered to satisfaction)  Severity of Illness: The appropriate patient status for this patient is OBSERVATION. Observation status is judged to be reasonable and necessary in order to provide the required intensity of service to ensure the patient's safety. The patient's presenting symptoms, physical exam findings, and initial radiographic and laboratory data in the context of their medical condition is felt to place them at decreased risk for further clinical deterioration. Furthermore, it is anticipated that the patient will be medically stable for discharge from the hospital within 2 midnights of admission.   Author: Bernadette Hoit, DO 10/02/2021 12:00 AM  For on call review [email or web address].

## 2021-10-01 NOTE — ED Triage Notes (Signed)
Pt brought in by tenant for hallucinations and disoriented, pt very pale and bruises in various stages of healing to arms.

## 2021-10-02 ENCOUNTER — Observation Stay (HOSPITAL_COMMUNITY): Payer: 59

## 2021-10-02 ENCOUNTER — Observation Stay (HOSPITAL_COMMUNITY)
Admit: 2021-10-02 | Discharge: 2021-10-02 | Disposition: A | Discharge status: Home / Self Care | Payer: 59 | Attending: Internal Medicine | Admitting: Internal Medicine

## 2021-10-02 DIAGNOSIS — E46 Unspecified protein-calorie malnutrition: Secondary | ICD-10-CM

## 2021-10-02 DIAGNOSIS — E876 Hypokalemia: Secondary | ICD-10-CM | POA: Diagnosis not present

## 2021-10-02 DIAGNOSIS — D75839 Thrombocytosis, unspecified: Secondary | ICD-10-CM

## 2021-10-02 DIAGNOSIS — G9341 Metabolic encephalopathy: Secondary | ICD-10-CM | POA: Diagnosis not present

## 2021-10-02 DIAGNOSIS — K50011 Crohn's disease of small intestine with rectal bleeding: Secondary | ICD-10-CM | POA: Diagnosis not present

## 2021-10-02 DIAGNOSIS — R2681 Unsteadiness on feet: Secondary | ICD-10-CM

## 2021-10-02 LAB — COMPREHENSIVE METABOLIC PANEL
ALT: 14 U/L (ref 0–44)
AST: 24 U/L (ref 15–41)
Albumin: 1.6 g/dL — ABNORMAL LOW (ref 3.5–5.0)
Alkaline Phosphatase: 113 U/L (ref 38–126)
Anion gap: 7 (ref 5–15)
BUN: 6 mg/dL (ref 6–20)
CO2: 22 mmol/L (ref 22–32)
Calcium: 7.2 mg/dL — ABNORMAL LOW (ref 8.9–10.3)
Chloride: 109 mmol/L (ref 98–111)
Creatinine, Ser: 0.76 mg/dL (ref 0.44–1.00)
GFR, Estimated: >60 mL/min (ref >60)
Glucose, Bld: 81 mg/dL (ref 70–99)
Potassium: 3.9 mmol/L (ref 3.5–5.1)
Sodium: 138 mmol/L (ref 135–145)
Total Bilirubin: 0.7 mg/dL (ref 0.3–1.2)
Total Protein: 4.6 g/dL — ABNORMAL LOW (ref 6.5–8.1)

## 2021-10-02 LAB — CBC
HCT: 33.2 % — ABNORMAL LOW (ref 36.0–46.0)
Hemoglobin: 10.9 g/dL — ABNORMAL LOW (ref 12.0–15.0)
MCH: 31.3 pg (ref 26.0–34.0)
MCHC: 32.8 g/dL (ref 30.0–36.0)
MCV: 95.4 fL (ref 80.0–100.0)
Platelets: 344 10*3/uL (ref 150–400)
RBC: 3.48 MIL/uL — ABNORMAL LOW (ref 3.87–5.11)
RDW: 17.7 % — ABNORMAL HIGH (ref 11.5–15.5)
WBC: 9.3 10*3/uL (ref 4.0–10.5)
nRBC: 0 % (ref 0.0–0.2)

## 2021-10-02 LAB — PHOSPHORUS: Phosphorus: 2.8 mg/dL (ref 2.5–4.6)

## 2021-10-02 LAB — APTT: aPTT: 45 seconds — ABNORMAL HIGH (ref 24–36)

## 2021-10-02 LAB — FOLATE: Folate: 2.7 ng/mL — ABNORMAL LOW (ref >5.9)

## 2021-10-02 LAB — TSH: TSH: 1.34 u[IU]/mL (ref 0.350–4.500)

## 2021-10-02 LAB — T4, FREE: Free T4: 0.76 ng/dL (ref 0.61–1.12)

## 2021-10-02 LAB — VITAMIN B12: Vitamin B-12: 616 pg/mL (ref 180–914)

## 2021-10-02 LAB — HIV ANTIBODY (ROUTINE TESTING W REFLEX): HIV Screen 4th Generation wRfx: NONREACTIVE

## 2021-10-02 LAB — MAGNESIUM: Magnesium: 2.3 mg/dL (ref 1.7–2.4)

## 2021-10-02 MED ORDER — LEVETIRACETAM 500 MG PO TABS
750.0000 mg | ORAL_TABLET | Freq: Two times a day (BID) | ORAL | Status: DC
  Administered 2021-10-02 – 2021-10-03 (×4): 750 mg via ORAL
  Filled 2021-10-02 (×4): qty 1

## 2021-10-02 MED ORDER — PROSOURCE PLUS PO LIQD
30.0000 mL | Freq: Two times a day (BID) | ORAL | Status: DC
  Administered 2021-10-03 (×2): 30 mL via ORAL
  Filled 2021-10-02 (×2): qty 30

## 2021-10-02 MED ORDER — PANTOPRAZOLE SODIUM 40 MG PO TBEC
80.0000 mg | DELAYED_RELEASE_TABLET | Freq: Every day | ORAL | Status: DC
  Administered 2021-10-02 – 2021-10-03 (×2): 80 mg via ORAL
  Filled 2021-10-02 (×2): qty 2

## 2021-10-02 MED ORDER — ENOXAPARIN SODIUM 40 MG/0.4ML IJ SOSY
40.0000 mg | PREFILLED_SYRINGE | INTRAMUSCULAR | Status: DC
  Administered 2021-10-02 – 2021-10-03 (×2): 40 mg via SUBCUTANEOUS
  Filled 2021-10-02 (×2): qty 0.4

## 2021-10-02 MED ORDER — ALPRAZOLAM 0.5 MG PO TABS
0.5000 mg | ORAL_TABLET | Freq: Three times a day (TID) | ORAL | Status: DC | PRN
Start: 2021-10-02 — End: 2021-10-03
  Administered 2021-10-03: 0.5 mg via ORAL
  Filled 2021-10-02 (×2): qty 1

## 2021-10-02 MED ORDER — ALPRAZOLAM 1 MG PO TABS
1.0000 mg | ORAL_TABLET | Freq: Three times a day (TID) | ORAL | Status: DC | PRN
Start: 2021-10-02 — End: 2021-10-02
  Administered 2021-10-02: 1 mg via ORAL
  Filled 2021-10-02: qty 1

## 2021-10-02 NOTE — Evaluation (Signed)
Physical Therapy Evaluation Patient Details Name: [name] MRN: [id number] DOB: 03/18/1971 Today's Date: 10/02/2021  History of Present Illness  [name] is a 50 y.o. female with medical history significant of Crohn's disease, GERD, COPD, seizures who presents to the emergency department after being brought by private vehicle due to altered mental status.  Patient complained of lower abdominal pain and some rash in upper and lower extremities which she thought was related to Crohn's disease, but also states that it was related to falls.  Some of the history was obtained from friends (who happened to be her tenants), per report, patient has been having intermittent hallucinations and disorientation since October of last year (2022).  Patient was able to follow commands, but could not provide any further pertinent history as regards why she came to the ED.   Clinical Impression  Patient is min assist with bed mobility needing HHA for supine to sit due to UE weakness observed. Patient also demonstrates decreased trunk control impacting bed mobility overall. Patient is min guard assist with using SPC, but is very unsteady and demonstrates balance deficits when utilizing this AD. Patient was able to ambulate in hallway for 100 feet with use of SPC and RW and min guard assist provided by PT. Patient was able to ambulate halfway with SPC but was unsteady and not effective with utilizing cane for balance after verbal/tactile cues provided by PT along with education of cane use. RW was used halfway through gait assessment in which patient significantly improved gait pattern and balance and is recommended for future use during ambulation. Patient has mild gait deviations and is limited by fatigue. Patient will benefit from continued skilled physical therapy in hospital and recommended venue below to increase strength, balance, endurance for safe ADLs and gait.      Recommendations for follow up therapy are  one component of a multi-disciplinary discharge planning process, led by the attending physician.  Recommendations may be updated based on patient status, additional functional criteria and insurance authorization.  Follow Up Recommendations Home health PT    Assistance Recommended at Discharge Set up Supervision/Assistance  Patient can return home with the following  A little help with walking and/or transfers;Help with stairs or ramp for entrance;A little help with bathing/dressing/bathroom    Equipment Recommendations Rolling walker (2 wheels)  Recommendations for Other Services       Functional Status Assessment Patient has had a recent decline in their functional status and demonstrates the ability to make significant improvements in function in a reasonable and predictable amount of time.     Precautions / Restrictions Precautions Precautions: Fall Restrictions Weight Bearing Restrictions: No      Mobility  Bed Mobility Overal bed mobility: Needs Assistance Bed Mobility: Supine to Sit     Supine to sit: Min assist     General bed mobility comments: Patient is min assist supine to sit with need of UE HHA due to generalized weakness. Patient also demonstrates decreased trunk control impacting bed mobility.    Transfers Overall transfer level: Needs assistance Equipment used: Straight cane Transfers: Sit to/from Stand, Bed to chair/wheelchair/BSC Sit to Stand: Min guard   Step pivot transfers: Min guard       General transfer comment: Patient able to transfer with SPC with min guard but is unsteady and demonstrates inproper use of cane resulting in unsteadiness and impacting balancing ability during transfers.    Ambulation/Gait Ambulation/Gait assistance: Min guard Gait Distance (Feet): 100 Feet Assistive device: Rolling   walker (2 wheels), Straight cane Gait Pattern/deviations: Step-to pattern, Decreased weight shift to right, Decreased weight shift to left,  Drifts right/left, Decreased dorsiflexion - right, Decreased stride length, Decreased step length - left, Decreased step length - right Gait velocity: decreased     General Gait Details: Patient was able to ambulate for 100 feet in hallway with use of straight cane and RW and min guard assist provided by PT. Patient was able to ambulate halfway with straight cane but was very unsteady and was not effective with utilizing cane for balance, even with verbal and tactile cueing. RW was trialed in which patient's gait pattern and balance improved immediately. Patient has mild gait deviations with weight shifting and step/stride length along with drifting right/left during assessment. Patient was limited by fatigue.  Stairs            Wheelchair Mobility    Modified Rankin (Stroke Patients Only)       Balance Overall balance assessment: Needs assistance Sitting-balance support: No upper extremity supported, Feet supported Sitting balance-Leahy Scale: Good Sitting balance - Comments: Patient able to sit EOB with UE support   Standing balance support: During functional activity, Reliant on assistive device for balance, Bilateral upper extremity supported Standing balance-Leahy Scale: Fair Standing balance comment: Patient balance is fair with RW with minor balance deficits. Patient is very unsteady with SPC.                             Pertinent Vitals/Pain Pain Assessment Pain Assessment: 0-10 Pain Score: 8  Pain Location: stomach Pain Descriptors / Indicators: Sharp, Other (Comment) Pain Intervention(s): Limited activity within patient's tolerance, Monitored during session, Repositioned    Home Living Family/patient expects to be discharged to:: Private residence Living Arrangements: Other (Comment) (tenants) Available Help at Discharge: Available 24 hours/day;Family Type of Home: House Home Access: Stairs to enter Entrance Stairs-Rails: None Entrance Stairs-Number  of Steps: 2   Home Layout: One level Home Equipment: Cane - single point      Prior Function Prior Level of Function : Needs assist       Physical Assist : ADLs (physical);Mobility (physical) Mobility (physical): Transfers ADLs (physical): IADLs;Dressing;Bathing Mobility Comments: Patietn is a household ambulator with intermittent use of SPC. Patient is a limited Hydrographic surveyor. Patient driving status unclear. ADLs Comments: Assisted for grocery shopping. Able to participate in cooking and cleaning on good days. Pt reports need for supervision assist bathing and physical assist dressing at times.     Hand Dominance   Dominant Hand: Right    Extremity/Trunk Assessment   Upper Extremity Assessment Upper Extremity Assessment: Defer to OT evaluation    Lower Extremity Assessment Lower Extremity Assessment: Generalized weakness    Cervical / Trunk Assessment Cervical / Trunk Assessment: Normal  Communication   Communication: No difficulties  Cognition Arousal/Alertness: Awake/alert Behavior During Therapy: WFL for tasks assessed/performed Overall Cognitive Status: Within Functional Limits for tasks assessed                                          General Comments      Exercises     Assessment/Plan    PT Assessment All further PT needs can be met in the next venue of care;Patient needs continued PT services  PT Problem List Decreased strength;Decreased activity tolerance;Decreased balance;Decreased mobility;Decreased coordination  PT Treatment Interventions DME instruction;Gait training;Functional mobility training;Therapeutic activities;Therapeutic exercise;Balance training    PT Goals (Current goals can be found in the Care Plan section)  Acute Rehab PT Goals Patient Stated Goal: return home PT Goal Formulation: With patient Time For Goal Achievement: 10/09/21 Potential to Achieve Goals: Good    Frequency Min 3X/week      Co-evaluation PT/OT/SLP Co-Evaluation/Treatment: Yes Reason for Co-Treatment: To address functional/ADL transfers PT goals addressed during session: Mobility/safety with mobility;Balance;Proper use of DME;Strengthening/ROM OT goals addressed during session: ADL's and self-care       AM-PAC PT "6 Clicks" Mobility  Outcome Measure Help needed turning from your back to your side while in a flat bed without using bedrails?: A Little Help needed moving from lying on your back to sitting on the side of a flat bed without using bedrails?: A Little Help needed moving to and from a bed to a chair (including a wheelchair)?: A Little Help needed standing up from a chair using your arms (e.g., wheelchair or bedside chair)?: A Little Help needed to walk in hospital room?: A Little Help needed climbing 3-5 steps with a railing? : A Lot 6 Click Score: 17    End of Session Equipment Utilized During Treatment: Gait belt Activity Tolerance: Patient tolerated treatment well;No increased pain;Patient limited by fatigue Patient left: in chair;with family/visitor present;with call bell/phone within reach Nurse Communication: Mobility status PT Visit Diagnosis: Unsteadiness on feet (R26.81);Other abnormalities of gait and mobility (R26.89);Muscle weakness (generalized) (M62.81)    Time: 2119-4174 PT Time Calculation (min) (ACUTE ONLY): 24 min   Charges:   PT Evaluation $PT Eval Moderate Complexity: 1 Mod PT Treatments $Therapeutic Activity: 23-37 mins        11:08 AM, 10/02/21 Lestine Box, S/PT

## 2021-10-02 NOTE — Progress Notes (Signed)
Pt is off unit for procedure

## 2021-10-02 NOTE — Assessment & Plan Note (Signed)
MRI brain Check B52, folic acid, RPR, TSH, CPK Suspect polypharmacy and use of hypnotic medications PT/OT>> home health PT and OT Urged patient to use assistive devices

## 2021-10-02 NOTE — TOC Initial Note (Signed)
Transition of Care [facility]) - Initial/Assessment Note    Patient Details  Name: [name] MRN: [id number] Date of Birth: 03/22/1971  Transition of [facility]) CM/SW Contact:    Iona Beard, [place] Phone Number: 10/02/2021, 10:18 AM  Clinical Narrative:                 CSW updated that pt is being recommended for [facility] PT/OT and DME (rolling walker and 3N1). CSW spoke with pt about this, pt is agreeable to referral to [facility] for DME. CSW explained that due to pts insurnace she will likley have a large copay and there are not many [place] that accept her insurance. CSW explained that OP PT and OT referrals could be made and the outpatient office would speak with her about copays before making appointments. Pt is agreeable to outpatient therapies. CSW made referrals for OP PT and OT.   CSW made referral for DME to [facility] with [facility]. CSW updated that pts [place] insurance plan listed in the chart is showing as inactive and that she currently has family planning medicaid. Due to this [facility] will not be able to supple DME for pt.   CSW to update pt that she will need to look at [facility] or on [place] as her insurance will not cover the needed DME.   Expected Discharge Plan: OP Rehab Barriers to Discharge: Continued Medical Work up   Patient Goals and CMS Choice Patient states their goals for this hospitalization and ongoing recovery are:: return home CMS Medicare.gov Compare Post Acute Care list provided to:: Patient Choice offered to / list presented to : Patient  Expected Discharge Plan and Services Expected Discharge Plan: OP Rehab                                              Prior Living Arrangements/Services     Patient language and need for interpreter reviewed:: Yes Do you feel safe going back to the place where you live?: Yes      Need for Family Participation in Patient Care: No (Comment) Care giver support system in place?: Yes (comment)   Criminal  Activity/Legal Involvement Pertinent to Current Situation/Hospitalization: No - Comment as needed  Activities of Daily Living Home Assistive Devices/Equipment: Cane (specify quad or straight) ADL Screening (condition at time of admission) Patient's cognitive ability adequate to safely complete daily activities?: No Is the patient deaf or have difficulty hearing?: No Does the patient have difficulty seeing, even when wearing glasses/contacts?: No Does the patient have difficulty concentrating, remembering, or making decisions?: Yes Patient able to express need for assistance with ADLs?: Yes Does the patient have difficulty dressing or bathing?: No Independently performs ADLs?: Yes (appropriate for developmental age) Does the patient have difficulty walking or climbing stairs?: Yes Weakness of Legs: Both Weakness of Arms/Hands: None  Permission Sought/Granted                  Emotional Assessment Appearance:: Appears stated age Attitude/Demeanor/Rapport: Engaged Affect (typically observed): Accepting Orientation: : Oriented to Self, Oriented to Place, Oriented to Situation Alcohol / Substance Use: Not Applicable Psych Involvement: No (comment)  Admission diagnosis:  Hypokalemia [E87.6] Altered mental status [R41.82] Altered mental status, unspecified altered mental status type [R41.82] Patient Active Problem List   Diagnosis Date Noted   Thrombocytosis 10/02/2021   Gait instability 10/02/2021   Hypokalemia  10/01/2021   Skin test reaction, systemic 09/07/2021   N&V (nausea and vomiting) 04/30/2021   Abdominal pain    Bloating    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[place])    Hematochezia 02/04/2021   Crohn's colitis ([place]) 02/03/2021   Diarrhea    Acute metabolic encephalopathy 77/93/9030   Moderate Pericardial effusion 01/19/2021   Right foot pain    AKI (acute kidney injury) ([place]) 01/16/2021   Rhabdomyolysis 01/16/2021   Lactic acidosis 01/16/2021   COVID-19 virus infection  01/16/2021   Severe sepsis ([place]) 01/16/2021   Seizure ([place]) 06/24/2020   MDD (major depressive disorder) 03/24/2020   Chest pain 02/07/2020   Chronic patellofemoral pain of both knees 01/11/2020   Essential hypertension 06/17/2018   Insomnia 12/13/2017   Neuropathy 12/13/2017   Chest pain at rest 05/09/2016   COPD (chronic obstructive pulmonary disease) ([place])    Crohn's disease ([place])    Diabetes mellitus without complication ([place])    Anxiety    Tobacco abuse    PTSD (post-traumatic stress disorder)    Palpitation    Constipation 09/20/2012   Head injury 05/09/2012   Hyperlipidemia 04/11/2012   DJD (degenerative joint disease), lumbar 04/04/2012   Paroxysmal SVT (supraventricular tachycardia) ([facility]) 04/04/2012   Tobacco dependence 04/04/2012   Dyspnea 01/26/2012   Paresthesia 11/25/2011   Abdominal pain, chronic, epigastric 11/01/2011   Abdominal pain, acute, periumbilical 01/23/3006   GERD (gastroesophageal reflux disease)    Hyperglycemia    Chronic low back pain    IBS (irritable bowel syndrome)    Closed head injury    TOBACCO ABUSE 07/13/2010   Palpitations 07/13/2010   Pleuritic chest pain 07/13/2010   Anxiety state, unspecified 07/03/2008   BACK PAIN, LUMBAR, CHRONIC 07/03/2008   PCP:  Teodoro Kil, PA-C Pharmacy:   [place] [place] [address] [place] [address] Phone: [phone] Fax: [place], [address] [place] [address] Phone: [phone] Fax: [phone]  [facility] [address], [place] [address] Phone: [phone] Fax: [phone]  [place], [place] [address] [place] [address] [place] [address] Phone: [phone] Fax: [phone]     Social Determinants of Health (SDOH) Interventions    Readmission Risk Interventions     View : No data to display.

## 2021-10-02 NOTE — Consult Note (Signed)
Gastroenterology Consult   Referring Provider: No ref. provider found Primary Care Physician:  Teodoro Kil, PA-C Primary Gastroenterologist:  Dr. Jenetta Downer  Patient ID: [name]; [id number]; 06/18/1970   Admit date: 10/01/2021  LOS: 0 days   Date of Consultation: 10/02/2021  Reason for Consultation:  Chron's   History of Present Illness   [name] is a 51 y.o. year old female with history of anxiety, asthma, COPD, GERD, HTN, IBS, seizures, chronic abdominal pain, tobacco abuse, PTSD type 2 diabetes, and colonic Crohn's disease who presented to the ED by her friend/tenants with AMS.  Patient complained of lower abdominal pain and rash in her upper and lower extremities felt to be related to her Crohn's disease or possible recent fall.  GI consulted for further management of Crohn's.  Prior workup/treatment: Last colonoscopy 07/15/2021 with noncritical rectal stricture, severe inflammation of the distal sigmoid and rectum, biopsy showing chronic active colitis with ulceration and necroinflammatory debris  Last seen Dr. Jenetta Downer 09/07/2021.  Complex history of Crohn's that was untreated for multiple years prior to starting Remicade.  Partially responded to Remicade but developed allergic reaction and switch to Humira.  She had recent recurrent episodes of interactions with Humira including pruritus and pain where lesions were located.  Was having 4-5 watery bowel movements per day on Humira with scant blood in stool, having frequent nausea and vomiting and tenesmus.  Also presented with abdominal pain in her lower abdomen.  Continues to smoke 4 packs cigarettes in 3 weeks.  Had also had recent fever to 103 when her rash started.  She noted a 30 pound weight loss since her symptoms began.  She was advised to stop Humira, extensive conversations were had with her regarding need for tobacco cessation and the need to consider Stelara/Skyrizi or surgical approach for ileostomy due to her  history of stricturing disease but has refused surgery in the past.  Has follow-up with [facility] Dr. Nyoka Cowden and [facility] as she needs multidisciplinary approach as her IBD is complex.  She was given short course of methylprednisone and hydroxyzine.    ED course: BP initially soft at 93/68 and some tachycardia that normalized shortly after arrival Thrombocytosis noted on CBC, CMP with hyponatremia (134) and hypokalemia (2.2), Albumin 2.1, alk phos 132, ammonia 16, alcohol <10 Urine tox positive for benzos and THC UA with moderate leukocytes, 21-50 WBC CT head with no evidence of acute intracranial abnormality CT cervical spine with no evidence of acute fracture Potassium repleted, IV hydration provided   Consult: Bowel movements -4-5 a day, sometimes semi-formed, sometimes loose and mucous. Intermittent hematochezia. Has been having lots of N/V. Vomit is more bile like and sometimes with undigested food. Has been supplementing diet with ensures (about 1-2 daily) for the last week. Reports abdominal pain has been about the same as it always is. Has not been taking mesalamine and stopped humira as directed in the beginning of May. She continues to smoke (about 1/2 pack a day).  Denies alcohol use or other drug use. She does not feel like the steroid taper improved her symptoms much at all.   Syncopal episodes - 10 or so in the last month and many other falls.    Past Medical History:  Diagnosis Date   Anxiety    Asthma    Chronic low back pain    Collagen vascular disease (HCC)    COPD (chronic obstructive pulmonary disease) (HCC)    Crohn's disease (HCC)    GERD (gastroesophageal reflux  disease)    EGD 01/2007 by Dr.Rourke small hiatal hernia s/p 56 french maloney    History of head injury    Hypertension    IBS (irritable bowel syndrome)    PSVT (paroxysmal supraventricular tachycardia) (HCC)    PTSD (post-traumatic stress disorder)    Recurrent chest pain    Seizure disorder ([place])    Type 2  diabetes mellitus ([place])     Past Surgical History:  Procedure Laterality Date   ABDOMINAL HYSTERECTOMY     with right salpingo oophorectomy 2005   APPENDECTOMY  2006   BALLOON DILATION N/A 02/19/2021   Procedure: BALLOON DILATION;  Surgeon: Eloise Harman, DO;  [facility];  Service: Endoscopy;  Laterality: N/A;  Sigmoid colon stricture   BIOPSY  02/06/2021   Procedure: BIOPSY;  Surgeon: Eloise Harman, DO;  [facility];  Service: Endoscopy;;   BIOPSY  02/19/2021   Procedure: BIOPSY;  Surgeon: Eloise Harman, DO;  [facility];  Service: Endoscopy;;   BIOPSY  07/15/2021   Procedure: BIOPSY;  Surgeon: Rogene Houston, MD;  [facility];  Service: Endoscopy;;   CESAREAN SECTION     1996   CHOLECYSTECTOMY     COLONOSCOPY  2010   Dr. Gala Romney; negative except for hemorrhoids   ESOPHAGEAL DILATION N/A 10/25/2014   Procedure: ESOPHAGEAL DILATION;  Surgeon: Rogene Houston, MD;  [facility];  Service: Endoscopy;  Laterality: N/A;   ESOPHAGOGASTRODUODENOSCOPY N/A 10/25/2014   Procedure: ESOPHAGOGASTRODUODENOSCOPY (EGD);  Surgeon: Rogene Houston, MD;  [facility];  Service: Endoscopy;  Laterality: N/A;  [place] N/A 02/06/2021   Procedure: FLEXIBLE SIGMOIDOSCOPY;  Surgeon: Eloise Harman, DO;  [facility];  Service: Endoscopy;  Laterality: N/A;   FLEXIBLE SIGMOIDOSCOPY N/A 02/19/2021   Procedure: FLEXIBLE SIGMOIDOSCOPY;  Surgeon: Eloise Harman, DO;  [facility];  Service: Endoscopy;  Laterality: N/A;   OOPHORECTOMY     left for torsion and ovarian fibroma; uterine myoma resected 1995   SIGMOIDOSCOPY  07/15/2021   Procedure: SIGMOIDOSCOPY;  Surgeon: Rogene Houston, MD;  [facility];  Service: Endoscopy;;    Prior to Admission medications   Medication Sig Start Date End Date Taking? Authorizing Provider  albuterol (VENTOLIN HFA) 108 (90 Base) MCG/ACT  inhaler Inhale 2 puffs into the lungs every 4 (four) hours as needed for wheezing or shortness of breath. 01/24/21  Yes Emokpae, Courage, MD  ALPRAZolam [name]) 1 MG tablet Take 1 mg by mouth 3 (three) times daily as needed for anxiety. 07/11/19  Yes [provider]  amitriptyline (ELAVIL) 75 MG tablet Take 100 mg by mouth at bedtime. 12/16/20  Yes [provider]  cyclobenzaprine (FLEXERIL) 10 MG tablet Take 10 mg by mouth 3 (three) times daily as needed for muscle spasms. 07/11/19  Yes [provider]  hydrOXYzine (ATARAX) 10 MG tablet Take 1 tablet (10 mg total) by mouth at bedtime. Patient taking differently: Take 50 mg by mouth every 8 (eight) hours as needed for anxiety. 09/07/21  Yes Harvel Quale, MD  levETIRAcetam (KEPPRA) 750 MG tablet Take 750 mg by mouth 2 (two) times daily. 09/30/20  Yes [provider]  omeprazole (PRILOSEC) 40 MG capsule Take 40 mg by mouth daily. 07/21/19  Yes [provider]  ondansetron (ZOFRAN-ODT) 4 MG disintegrating tablet Take 4 mg by mouth every 8 (eight) hours as needed for nausea or  vomiting. 09/07/21  Yes [provider]  RESTASIS 0.05 % ophthalmic emulsion Place 1 drop into both eyes 2 (two) times daily. 12/12/20  Yes [provider]  SPIRIVA RESPIMAT 2.5 MCG/ACT AERS Inhale 1 puff into the lungs daily as needed (shortness of breath). Patient taking differently: Inhale 1 puff into the lungs daily. 01/24/21  Yes Emokpae, Courage, MD  nitroGLYCERIN (NITROSTAT) 0.4 MG SL tablet Place 1 tablet (0.4 mg total) under the tongue every 5 (five) minutes as needed for chest pain. 03/19/21   Rogene Houston, MD    Current Facility-Administered Medications  Medication Dose Route Frequency Provider Last Rate Last Admin   0.9 %  sodium chloride infusion   Intravenous Continuous Adefeso, Oladapo, DO 125 mL/hr at 10/02/21 0042 New Bag at 10/02/21 0042   ALPRAZolam (XANAX) tablet 0.5 mg  0.5 mg Oral TID PRN  Tat, Shanon Brow, MD       enoxaparin (LOVENOX) injection 40 mg  40 mg Subcutaneous Q24H Adefeso, Oladapo, DO   40 mg at 10/02/21 6606   feeding supplement (ENSURE ENLIVE / ENSURE PLUS) liquid 237 mL  237 mL Oral BID BM Adefeso, Oladapo, DO   237 mL at 10/02/21 0805   ipratropium (ATROVENT) nebulizer solution 0.5 mg  0.5 mg Nebulization Q4H PRN Adefeso, Oladapo, DO       levETIRAcetam (KEPPRA) tablet 750 mg  750 mg Oral BID Adefeso, Oladapo, DO   750 mg at 10/02/21 0805   nicotine (NICODERM CQ - dosed in mg/24 hours) patch 21 mg  21 mg Transdermal Daily Adefeso, Oladapo, DO   21 mg at 10/02/21 0805   pantoprazole (PROTONIX) EC tablet 80 mg  80 mg Oral Daily Adefeso, Oladapo, DO   80 mg at 10/02/21 3016   triamcinolone 0.1 % cream : eucerin cream, 1:1   Topical BID PRN Adefeso, Oladapo, DO        Allergies as of 10/01/2021 - Review Complete 10/01/2021  Allergen Reaction Noted   Codeine Shortness Of Breath, Swelling, and Rash    Dilaudid [hydromorphone hcl] Shortness Of Breath and Swelling 12/26/2012   Hydrocodone Shortness Of Breath, Swelling, and Rash 12/26/2012   Morphine and related Shortness Of Breath, Swelling, and Rash 12/26/2012   Oxycodone Shortness Of Breath, Swelling, and Rash 12/26/2012   Penicillins Shortness Of Breath, Swelling, and Other (See Comments)    Acetaminophen  12/26/2012   Ativan [lorazepam] Other (See Comments) 12/20/2016   Strawberry extract Swelling 07/22/2011   Watermelon concentrate Swelling 07/22/2011   Albuterol Palpitations 07/08/2021   Benadryl [diphenhydramine hcl] Palpitations 09/23/2011   Latex Rash    Remicade [infliximab] Rash 07/08/2021    Family History  Problem Relation Age of Onset   Cancer Mother    Heart failure Mother    Hypertension Mother    Heart attack Mother    Heart attack Father 68   Cancer Father 46   Heart failure Father    Diabetes Father    Crohn's disease Cousin     Social History   Socioeconomic History   Marital status:  Divorced    Spouse name: Not on file   Number of children: Not on file   Years of education: Not on file   Highest education level: Not on file  Occupational History   Not on file  Tobacco Use   Smoking status: Some Days    Packs/day: 0.50    Years: 20.00    Pack years: 10.00    Types: Cigarettes  Start date: 09/26/1984    Passive exposure: Current   Smokeless tobacco: Never  Vaping Use   Vaping Use: Never used  Substance and Sexual Activity   Alcohol use: No    Alcohol/week: 0.0 standard drinks   Drug use: Not Currently   Sexual activity: Yes    Birth control/protection: Surgical  Other Topics Concern   Not on file  Social History Narrative   Not on file   Social Determinants of Health   Financial Resource Strain: Not on file  Food Insecurity: Not on file  Transportation Needs: Not on file  Physical Activity: Not on file  Stress: Not on file  Social Connections: Not on file  Intimate Partner Violence: Not on file     Review of Systems   Gen: Denies any fever, chills, loss of appetite, change in weight or weight loss CV: Denies chest pain, heart palpitations, syncope, edema  Resp: Denies shortness of breath with rest, cough, wheezing, coughing up blood, and pleurisy. GI: Denies vomiting blood, jaundice, and fecal incontinence.   Denies dysphagia or odynophagia. GU : Denies urinary burning, blood in urine, urinary frequency, and urinary incontinence. MS: Denies joint pain, limitation of movement, swelling, cramps, and atrophy.  Derm: Denies rash, itching, dry skin, hives. Psych: Denies depression, anxiety, memory loss, hallucinations, and confusion. Heme: Denies bruising or bleeding Neuro:  Denies any headaches, dizziness, paresthesias, shaking  Physical Exam   Vital Signs in last 24 hours: Temp:  [97.4 F (36.3 C)-97.8 F (36.6 C)] 97.7 F (36.5 C) (06/02 0435) Pulse Rate:  [92-116] 95 (06/02 0435) Resp:  [13-23] 17 (06/02 0435) BP: (93-116)/(45-83)  93/69 (06/02 0435) SpO2:  [95 %-100 %] 100 % (06/02 0435) Weight:  [52.7 kg-54.4 kg] 52.7 kg (06/02 0009) Last BM Date : 10/01/21  General:   Alert,  Well-developed, well-nourished, pleasant and cooperative in NAD Head:  Normocephalic and atraumatic. Eyes:  Sclera clear, no icterus.   Conjunctiva pink. Ears:  Normal auditory acuity. Lungs:  Clear throughout to auscultation.   No wheezes, crackles, or rhonchi. No acute distress. Heart:  Regular rate and rhythm; no murmurs, clicks, rubs,  or gallops. Abdomen:  Soft, tender to lower abdomen and nondistended. No masses, hepatosplenomegaly or hernias noted. Normal bowel sounds Rectal: deferred   Msk:  Symmetrical without gross deformities. Normal posture. Extremities:  Without clubbing or edema. Neurologic:  Alert and  oriented to self and place. Waxes/wanes. Skin:  multiple bruises Psych:  Alert and cooperative. Normal mood and affect.  Intake/Output from previous day: 06/01 0701 - 06/02 0700 In: 2727.3 [P.O.:360; I.V.:1791.5; IV Piggyback:575.8] Out: -  Intake/Output this shift: No intake/output data recorded.   Labs/Studies   Recent Labs Recent Labs    10/01/21 1527 10/02/21 0534  WBC 9.2 9.3  HGB 12.2 10.9*  HCT 36.7 33.2*  PLT 410* 344   BMET Recent Labs    10/01/21 1527 10/02/21 0620  NA 134* 138  K 2.3* 3.9  CL 93* 109  CO2 31 22  GLUCOSE 101* 81  BUN 7 6  CREATININE 0.93 0.76  CALCIUM 7.9* 7.2*   LFT Recent Labs    10/01/21 1527 10/02/21 0620  PROT 5.6* 4.6*  ALBUMIN 2.1* 1.6*  AST 24 24  ALT 21 14  ALKPHOS 132* 113  BILITOT 0.5 0.7   PT/INR No results for input(s): LABPROT, INR in the last 72 hours. Hepatitis Panel No results for input(s): HEPBSAG, HCVAB, HEPAIGM, HEPBIGM in the last 72 hours. C-Diff No results for  input(s): CDIFFTOX in the last 72 hours.  Radiology/Studies CT Head Wo Contrast  Result Date: 10/01/2021 CLINICAL DATA:  Provided history: Polytrauma, blunt. Mental status  change, unknown cause. EXAM: CT HEAD WITHOUT CONTRAST CT CERVICAL SPINE WITHOUT CONTRAST TECHNIQUE: Multidetector CT imaging of the head and cervical spine was performed following the standard protocol without intravenous contrast. Multiplanar CT image reconstructions of the cervical spine were also generated. RADIATION DOSE REDUCTION: This exam was performed according to the departmental dose-optimization program which includes automated exposure control, adjustment of the mA and/or kV according to patient size and/or use of iterative reconstruction technique. COMPARISON:  Prior head CT examinations 02/16/2021 and earlier. Cervical spine radiographs 01/14/2014 FINDINGS: CT HEAD FINDINGS Brain: No age advanced or lobar predominant parenchymal atrophy. There is no acute intracranial hemorrhage. No demarcated cortical infarct. No extra-axial fluid collection. No evidence of an intracranial mass. No midline shift. Vascular: No hyperdense vessel. Skull: No fracture or aggressive osseous lesion. Sinuses/Orbits: No mass or acute finding within the imaged orbits. Minimal mucosal thickening scattered within the bilateral ethmoid sinuses. Mild mucosal thickening versus small mucous retention cyst within the right sphenoid sinus. CT CERVICAL SPINE FINDINGS Alignment: Reversal of the expected cervical lordosis. Trace C3-C4 grade 1 anterolisthesis. Skull base and vertebrae: The basion-dental and atlanto-dental intervals are maintained.No evidence of acute fracture to the cervical spine. Soft tissues and spinal canal: No prevertebral fluid or swelling. No visible canal hematoma. Disc levels: Cervical spondylosis with multilevel disc space narrowing, disc bulges, posterior disc osteophytes, endplate spurring and uncovertebral hypertrophy. Disc space narrowing is greatest at C5-C6 (moderate to advanced) and C6-C7 (advanced). A posterior disc osteophyte at C6-C7 contributes to apparent moderate spinal canal stenosis. Bony neural  foraminal narrowing bilaterally at C5-C6 and C6-C7. Upper chest: No consolidation within the imaged lung apices. No visible pneumothorax. IMPRESSION: Head CT: 1. No evidence of acute intracranial abnormality. 2. Mild paranasal sinus disease, as described. CT cervical spine: 1. No evidence of acute fracture to the cervical spine. 2. Nonspecific reversal of the expected cervical lordosis. 3. Trace C3-C4 grade 1 anterolisthesis. 4. Cervical spondylosis, as described. Electronically Signed   By: Kellie Simmering D.O.   On: 10/01/2021 16:08   CT Cervical Spine Wo Contrast  Result Date: 10/01/2021 CLINICAL DATA:  Provided history: Polytrauma, blunt. Mental status change, unknown cause. EXAM: CT HEAD WITHOUT CONTRAST CT CERVICAL SPINE WITHOUT CONTRAST TECHNIQUE: Multidetector CT imaging of the head and cervical spine was performed following the standard protocol without intravenous contrast. Multiplanar CT image reconstructions of the cervical spine were also generated. RADIATION DOSE REDUCTION: This exam was performed according to the departmental dose-optimization program which includes automated exposure control, adjustment of the mA and/or kV according to patient size and/or use of iterative reconstruction technique. COMPARISON:  Prior head CT examinations 02/16/2021 and earlier. Cervical spine radiographs 01/14/2014 FINDINGS: CT HEAD FINDINGS Brain: No age advanced or lobar predominant parenchymal atrophy. There is no acute intracranial hemorrhage. No demarcated cortical infarct. No extra-axial fluid collection. No evidence of an intracranial mass. No midline shift. Vascular: No hyperdense vessel. Skull: No fracture or aggressive osseous lesion. Sinuses/Orbits: No mass or acute finding within the imaged orbits. Minimal mucosal thickening scattered within the bilateral ethmoid sinuses. Mild mucosal thickening versus small mucous retention cyst within the right sphenoid sinus. CT CERVICAL SPINE FINDINGS Alignment:  Reversal of the expected cervical lordosis. Trace C3-C4 grade 1 anterolisthesis. Skull base and vertebrae: The basion-dental and atlanto-dental intervals are maintained.No evidence of acute fracture to the cervical spine.  Soft tissues and spinal canal: No prevertebral fluid or swelling. No visible canal hematoma. Disc levels: Cervical spondylosis with multilevel disc space narrowing, disc bulges, posterior disc osteophytes, endplate spurring and uncovertebral hypertrophy. Disc space narrowing is greatest at C5-C6 (moderate to advanced) and C6-C7 (advanced). A posterior disc osteophyte at C6-C7 contributes to apparent moderate spinal canal stenosis. Bony neural foraminal narrowing bilaterally at C5-C6 and C6-C7. Upper chest: No consolidation within the imaged lung apices. No visible pneumothorax. IMPRESSION: Head CT: 1. No evidence of acute intracranial abnormality. 2. Mild paranasal sinus disease, as described. CT cervical spine: 1. No evidence of acute fracture to the cervical spine. 2. Nonspecific reversal of the expected cervical lordosis. 3. Trace C3-C4 grade 1 anterolisthesis. 4. Cervical spondylosis, as described. Electronically Signed   By: Kellie Simmering D.O.   On: 10/01/2021 16:08     Assessment   [name] is a 51 y.o. year old female with history of anxiety, COPD, GERD, HTN, IBS, PTSD, type 2 diabetes, and complex colonic Crohn's with stricturing disease who has now failed multiple therapies including Remicade and most recently Humira due to allergic reactions.  Patient presented to the ED with AMS and ongoing Crohn's flare.  GI consulted for further management recommendations.  Colonic Crohn's: Patient is disease processes complex, she notably went untreated for many years.  Now has failed Remicade and Humira due to allergic reactions.  She was recently seen outpatient by Dr. Jenetta Downer on 09/07/2021 and was advised to stop Humira and was given short course of methylprednisolone taper.  Her most  recent colonoscopy in March 2023 revealed noncritical rectal stricture, and pathology consistent with active colitis with ulceration and necroinflammatory debris. Has been having 4-5 bowel movements daily, history is difficult to obtain. Nursing reported mucous bowel movements this morning with intermittent blood in stool. Patient reports somewhat occasional semi-formed stools. She does have lower abdominal pain/tenderness on exam and frequent nausea/vomiting ongoing for months. For the last week she has been taking 1-2 ensures a day.   Altered mental status: Likely in the setting of polypharmacy and possible UTI. Ammonia 16, Urine tox positive fpr THC and benzodiazepines. Patient has history of seizures as well. Management per hospitalist. Patient's friends present ather bedside. Reported frequent falls and syncope episodes as well as medication non compliance and polypharmacy.  Plan / Recommendations   Recommend complete smoking cessation Continue PPI AMS workup per hospitalist May consider Budesonide if urine culture negative and no evidence of infection Need to keep follow up with tertiary center [facility]) Dr. Nyoka Cowden this Month (6/15).     10/02/2021, 10:28 AM  Venetia Night, MSN, FNP-BC, AGACNP-BC [facility]

## 2021-10-02 NOTE — Assessment & Plan Note (Signed)
Repleted Magnesium 2.2

## 2021-10-02 NOTE — Assessment & Plan Note (Signed)
Stable on room air

## 2021-10-02 NOTE — Progress Notes (Signed)
EEG completed, results pending. 

## 2021-10-02 NOTE — Assessment & Plan Note (Signed)
Likely secondary to stress demargination Monitor CBC

## 2021-10-02 NOTE — Assessment & Plan Note (Signed)
Continue pantoprazole. °

## 2021-10-02 NOTE — Evaluation (Signed)
Occupational Therapy Evaluation Patient Details Name: [name] MRN: [id number] DOB: 1970/10/11 Today's Date: 10/02/2021   History of Present Illness [name] is a 51 y.o. female with medical history significant of Crohn's disease, GERD, COPD, seizures who presents to the emergency department after being brought by private vehicle due to altered mental status.  Patient complained of lower abdominal pain and some rash in upper and lower extremities which she thought was related to Crohn's disease, but also states that it was related to falls.  Some of the history was obtained from friends (who happened to be her tenants), per report, patient has been having intermittent hallucinations and disorientation since October of last year (2022).  Patient was able to follow commands, but could not provide any further pertinent history as regards why she came to the ED. (Per DO)   Clinical Impression   Pt agreeable to OT and PT co-evaluation. Pt reports multiple falls in past 6 months due to having "bad" days where mobility is decreased. Pt presents with need for min A for bed mobility today and poor to fair standing balance with SPC for transfers. When using RW pt demonstrated fair balance and reported less difficulty ambulating in hall. Pt reports that on "bad" days, mobility is very difficult. This is the reasoning for recommendation of BSC to decrease pt demand for mobility on the days where she is feeling very weak. This will hopefully reduce fall risk as well. Pt is not recommended for further acute OT services and will be discharged to care of nursing staff for remaining length of stay.      Recommendations for follow up therapy are one component of a multi-disciplinary discharge planning process, led by the attending physician.  Recommendations may be updated based on patient status, additional functional criteria and insurance authorization.   Follow Up Recommendations  Home health OT     Assistance Recommended at Discharge Set up Supervision/Assistance  Patient can return home with the following A little help with walking and/or transfers;A little help with bathing/dressing/bathroom;Help with stairs or ramp for entrance;Assist for transportation;Assistance with cooking/housework    Functional Status Assessment  Patient has had a recent decline in their functional status and demonstrates the ability to make significant improvements in function in a reasonable and predictable amount of time.  Equipment Recommendations  BSC/3in1    Recommendations for Other Services       Precautions / Restrictions Precautions Precautions: Fall Restrictions Weight Bearing Restrictions: No      Mobility Bed Mobility Overal bed mobility: Needs Assistance Bed Mobility: Supine to Sit     Supine to sit: Min assist     General bed mobility comments: Min A to pull to sit from supine.    Transfers Overall transfer level: Needs assistance   Transfers: Sit to/from Stand, Bed to chair/wheelchair/BSC Sit to Stand: Min guard     Step pivot transfers: Min guard     General transfer comment: More unsteady when using SPC vs. RW for trasnfers in room and ambulation in hall.      Balance Overall balance assessment: Needs assistance Sitting-balance support: No upper extremity supported, Feet supported Sitting balance-Leahy Scale: Good Sitting balance - Comments: seated EOB   Standing balance support: During functional activity, Reliant on assistive device for balance, Bilateral upper extremity supported Standing balance-Leahy Scale: Fair Standing balance comment: fair with RW; poor to fair with SPC  ADL either performed or assessed with clinical judgement   ADL Overall ADL's : Needs assistance/impaired     Grooming: Supervision/safety;Min guard;Standing               Lower Body Dressing: Sitting/lateral leans;Modified  independent Lower Body Dressing Details (indicate cue type and reason): donning socks at EOB Toilet Transfer: Min guard;Rolling walker (2 wheels) Toilet Transfer Details (indicate cue type and reason): Min G with RW to ambulate in hall and return to chair. Toileting- Clothing Manipulation and Hygiene: Modified independent;Sitting/lateral lean       Functional mobility during ADLs: Min guard;Rolling walker (2 wheels) General ADL Comments: Pt unsteady using SPC with 3 or 4 instances of assist to maintain balance. Pt much more steady for transfers with RW.     Vision Baseline Vision/History: 1 Wears glasses Ability to See in Adequate Light: 1 Impaired Patient Visual Report: No change from baseline Vision Assessment?: No apparent visual deficits                Pertinent Vitals/Pain Pain Assessment Pain Assessment: 0-10 Pain Score: 8  Pain Location: stomach Pain Descriptors / Indicators: Sharp, Other (Comment) ("pass out" pain) Pain Intervention(s): Limited activity within patient's tolerance, Monitored during session, Repositioned     Hand Dominance Right   Extremity/Trunk Assessment Upper Extremity Assessment Upper Extremity Assessment: Generalized weakness   Lower Extremity Assessment Lower Extremity Assessment: Defer to PT evaluation   Cervical / Trunk Assessment Cervical / Trunk Assessment: Normal   Communication Communication Communication: No difficulties   Cognition Arousal/Alertness: Awake/alert Behavior During Therapy: WFL for tasks assessed/performed Overall Cognitive Status: Within Functional Limits for tasks assessed                                                        Home Living Family/patient expects to be discharged to:: Private residence Living Arrangements: Other (Comment) (roommate) Available Help at Discharge: Available 24 hours/day;Family Type of Home: House Home Access: Stairs to enter [employer] of  Steps: 2 Entrance Stairs-Rails: None Home Layout: One level     Bathroom Shower/Tub: Teacher, early years/pre: Standard     Home Equipment: Cane - single point          Prior Functioning/Environment Prior Level of Function : Needs assist       Physical Assist : ADLs (physical);Mobility (physical) Mobility (physical): Transfers ADLs (physical): IADLs;Dressing;Bathing Mobility Comments: [place] and community ambulator with [facility] but with reports of frequent falls. ADLs Comments: Assisted for grocery shopping. Able to participate in cooking and cleaning on good days. Pt reports need for supervision assist bathing and physical assist dressing at times.                      OT Goals(Current goals can be found in the care plan section) Acute Rehab OT Goals Patient Stated Goal: return home  OT Frequency:      Co-evaluation PT/OT/SLP Co-Evaluation/Treatment: Yes Reason for Co-Treatment: To address functional/ADL transfers   OT goals addressed during session: ADL's and self-care      AM-PAC OT "6 Clicks" Daily Activity     Outcome Measure Help from another person eating meals?: None Help from another person taking care of personal grooming?: A Little Help from another person toileting, which includes using toliet, bedpan, or  urinal?: None Help from another person bathing (including washing, rinsing, drying)?: A Little Help from another person to put on and taking off regular upper body clothing?: None Help from another person to put on and taking off regular lower body clothing?: A Little 6 Click Score: 21   End of Session Equipment Utilized During Treatment: Rolling walker (2 wheels);Other (comment) (SPC)  Activity Tolerance: Patient tolerated treatment well Patient left: in chair;with call bell/phone within reach  OT Visit Diagnosis: Unsteadiness on feet (R26.81);Other abnormalities of gait and mobility (R26.89);History of falling (Z91.81);Muscle weakness  (generalized) (M62.81)                Time: 3005-1102 OT Time Calculation (min): 21 min Charges:  OT General Charges $OT Visit: 1 Visit OT Evaluation $OT Eval Low Complexity: 1 Low  Analicia Skibinski OT, MOT  Larey Seat 10/02/2021, 9:13 AM

## 2021-10-02 NOTE — Assessment & Plan Note (Signed)
Continue Keppra Obtain EEG--neg for seizure

## 2021-10-02 NOTE — Hospital Course (Addendum)
51 year old female with a history of COPD, Crohn's disease, hypertension, diet-controlled diabetes mellitus, seizure, tobacco abuse, PTSD, and chronic abdominal pain presenting with altered mental status.  Review of the medical record shows that the patient has been dealing with chronic abdominal pain as well as rashes in her upper and lower extremities which has been attributed to reactions due to her anti-TNF treatment.  Apparently, the patient arrived by private vehicle to the ED confused.  She did not know why she was there. History is supplemented by patient's close friends.  Apparently the patient has had increasing falls and unsteady gait for the better part of the month.  She has not lost consciousness.  Apparently, the patient has had increasing confusion for the last 3 to 4 weeks.  She has not overused any of her hypnotic medications.  She does not drink alcohol.  She continues to smoke 1/2 pack/day. ED Course:  In the emergency department, BP was soft at 93/68, she was initially tachycardic, but this has since normalized, other vital signs within normal range.  Work-up in the ED showed normal CBC except for thrombocytosis.  BMP showed hyponatremia, hypokalemia.  Magnesium 2.2.  Albumin 2.1, ALP 132, alcohol level was less than 10, ammonia 16, urine drug screen was positive for benzodiazepine and THC.  Urinalysis was unimpressive for UTI CT head without contrast showed no evidence of acute intracranial abnormality CT cervical spine without contrast showed no evidence of acute fracture to the cervical spine.  Nonspecific reversal of the expected cervical lordosis.  Trace C3-C4 grade 1 anterolisthesis IV hydration was provided, potassium was replenished

## 2021-10-02 NOTE — Assessment & Plan Note (Signed)
Tobacco cessation discussed

## 2021-10-02 NOTE — Progress Notes (Signed)
Initial Nutrition Assessment  DOCUMENTATION CODES:      INTERVENTION:  Ensure Enlive po BID, (prefers vanilla)  Recommend zinc supplement 220 mg x 14 days (due to prolonged diarrhea)  Recommend folic acid - 5 mg /d x 4 months  Check vitamin D level   NUTRITION DIAGNOSIS:   Predicted suboptimal nutrient intake related to chronic illness (cron's disease) as evidenced by  (likely chronic malabsorption of nutrients associated with 4-5 stools daily).   GOAL:  Patient will meet greater than or equal to 90% of their needs   MONITOR:  PO intake, Labs, Weight trends  REASON FOR ASSESSMENT:   Consult Assessment of nutrition requirement/status  ASSESSMENT: Patient is a 51 yo history of Cron's disease, COPD, HTN, DM (diet controlled)chronic abdominal pain, tobacco use and PTSD. Presents with altered mental status, acute metabolic encephalopathy.   EEG - in process of set up during RD visit.   Patient says she spends most of her day "on the toilet". Home diet is regular and she is unable to provide details of usual diet today. Drinks Ensure at home to help met nutrition needs.   Weight loss noted since February-58.1 kg compared to  current 52.7 kg. An unplanned loss of 9% (5.4 kg) x 4 months which is trending toward significant. Suspect patient is malnourished associated with her Chon's disease.   Medications reviewed and include: IVF-NS@ 125 ml/hr  X-50 (wnl) and folic VWPV-9.4 (L).     Latest Ref Rng & Units 10/02/2021    6:20 AM 10/01/2021    3:27 PM 06/09/2021    1:05 PM  BMP  Glucose 70 - 99 mg/dL 81   101   115    BUN 6 - 20 mg/dL _0 Creatinine 0.44 - 1.00 mg/dL 0.76   0.93   0.68    BUN/Creat Ratio 6 - 22 (calc)   NOT APPLICABLE    Sodium 801 - 145 mmol/L 138   134   134    Potassium 3.5 - 5.1 mmol/L 3.9   2.3   3.8    Chloride 98 - 111 mmol/L 109   93   97    CO2 22 - 32 mmol/L _1 Calcium 8.9 - 10.3 mg/dL 7.2   7.9   8.8        NUTRITION -  FOCUSED PHYSICAL EXAM: Partial exam completed.  Mild clavicle, moderate patellar muscle loss, No fat depletion and mild edema.   Diet Order:   Diet Order             Diet Heart Room service appropriate? Yes; Fluid consistency: Thin  Diet effective now                   EDUCATION NEEDS:  Not appropriate for education at this time  Skin:  Skin Assessment: Reviewed RN Assessment  Last BM:  6/2 type 6 small  Height:   Ht Readings from Last 1 Encounters:  10/02/21 5' 6" (1.676 m)    Weight:   Wt Readings from Last 1 Encounters:  10/02/21 52.7 kg    Ideal Body Weight:   59 kg  BMI:  Body mass index is 18.75 kg/m.  Estimated Nutritional Needs:   Kcal:  1600-1800  Protein:  80-90 gr  Fluid:  >1600 ml daily  Colman Cater MS,RD,CSG,LDN Contact: Shea Evans

## 2021-10-02 NOTE — Progress Notes (Signed)
PROGRESS NOTE  [name] VZD:[id number] DOB: 12-17-1970 DOA: 10/01/2021 PCP: Teodoro Kil, PA-C  Brief History:  51 year old female with a history of COPD, Crohn's disease, hypertension, diet-controlled diabetes mellitus, seizure, tobacco abuse, PTSD, and chronic abdominal pain presenting with altered mental status.  Review of the medical record shows that the patient has been dealing with chronic abdominal pain as well as rashes in her upper and lower extremities which has been attributed to reactions due to her anti-TNF treatment.  Apparently, the patient arrived by private vehicle to the ED confused.  She did not know why she was there. ED Course:  In the emergency department, BP was soft at 93/68, she was initially tachycardic, but this has since normalized, other vital signs within normal range.  Work-up in the ED showed normal CBC except for thrombocytosis.  BMP showed hyponatremia, hypokalemia.  Magnesium 2.2.  Albumin 2.1, ALP 132, alcohol level was less than 10, ammonia 16, urine drug screen was positive for benzodiazepine and THC.  Urinalysis was unimpressive for UTI CT head without contrast showed no evidence of acute intracranial abnormality CT cervical spine without contrast showed no evidence of acute fracture to the cervical spine.  Nonspecific reversal of the expected cervical lordosis.  Trace C3-C4 grade 1 anterolisthesis IV hydration was provided, potassium was replenished      Assessment and Plan: * Acute metabolic encephalopathy Likely multifactorial including polypharmacy, UTI, and possibly postictal state from seizure Check B12 Check TSH Ammonia 16 UA 21-50 WBC>> obtain urine culture Check folic acid UDS positive for THC and benzodiazepines MR brain  Gait instability MRI brain Check I95, folic acid, RPR, TSH, CPK Suspect polypharmacy and use of hypnotic medications PT/OT>> home health PT and OT Urged patient to use assistive  devices  Thrombocytosis Likely secondary to stress demargination Monitor CBC  Hypokalemia Replete Magnesium 2.2  Seizure ([place]) Continue Keppra Obtain EEG  Crohn's disease ([place]) Patient follows with Dr. Jenetta Downer -- Last appointment on 09/07/2021 -- Instructed to continue Apriso and follow-up at [facility] 10/15/21--Dr. Nyoka Cowden -- Patient also has an appointment at [facility] 01/18/2022 -- She was given short course of methylprednisolone -- 07/15/2021 colonoscopy noncritical rectal stricture, severe inflammation of the distal sigmoid and rectum.  Biopsy pathology shows chronic active colitis with ulceration and necroinflammatory debris -- Patient normally has 4-5 bowel movements on a daily basis with intermittent hematochezia  COPD (chronic obstructive pulmonary disease) (HCC) Stable on room air  GERD (gastroesophageal reflux disease) Continue pantoprazole  TOBACCO ABUSE Tobacco cessation discussed    Family Communication:   caretakers at bedside updated 6/2  Consultants:  GI  Code Status:  FULL  DVT Prophylaxis:  SSC Lovenox   Procedures: As Listed in Progress Note Above  Antibiotics: None     Subjective: Patient states that she has 6-7 loose bowel movements a day.  She has intermittent hematochezia.  She has chronic abdominal pain.  She denies any headache, chest pain, shortness of breath, cough, hemoptysis,  Objective: Vitals:   10/01/21 1830 10/01/21 2200 10/02/21 0009 10/02/21 0435  BP: 100/67 (!) 97/53 (!) 111/57 93/69  Pulse: 95 99 100 95  Resp: 15 15 17 17   Temp:  (!) 97.5 F (36.4 C) (!) 97.4 F (36.3 C) 97.7 F (36.5 C)  TempSrc:  Oral    SpO2: 95% 97% 100% 100%  Weight:   52.7 kg   Height:   5' 6"  (1.676 m)     Intake/Output  Summary (Last 24 hours) at 10/02/2021 9470 Last data filed at 10/02/2021 0600 Gross per 24 hour  Intake 2727.3 ml  Output --  Net 2727.3 ml   Weight change:  Exam:  General:  Pt is alert, follows commands  appropriately, not in acute distress HEENT: No icterus, No thrush, No neck mass, [place]/AT Cardiovascular: RRR, S1/S2, no rubs, no gallops Respiratory: CTA bilaterally, no wheezing, no crackles, no rhonchi Abdomen: Soft/+BS, non tender, non distended, no guarding Extremities: No edema, No lymphangitis, No petechiae, scattered maculopapular erythematous patches in the bilateral pretibial areas.  No synovitis Neuro:  CN II-XII intact, strength 4/5 in RUE, RLE, strength 4/5 LUE, LLE; sensation intact bilateral; no dysmetria; babinski equivocal    Data Reviewed: I have personally reviewed following labs and imaging studies Basic Metabolic Panel: Recent Labs  Lab 10/01/21 1527 10/02/21 0620  NA 134* 138  K 2.3* 3.9  CL 93* 109  CO2 31 22  GLUCOSE 101* 81  BUN 7 6  CREATININE 0.93 0.76  CALCIUM 7.9* 7.2*  MG 2.2 2.3  PHOS  --  2.8   Liver Function Tests: Recent Labs  Lab 10/01/21 1527 10/02/21 0620  AST 24 24  ALT 21 14  ALKPHOS 132* 113  BILITOT 0.5 0.7  PROT 5.6* 4.6*  ALBUMIN 2.1* 1.6*   No results for input(s): LIPASE, AMYLASE in the last 168 hours. Recent Labs  Lab 10/01/21 1527  AMMONIA 16   Coagulation Profile: No results for input(s): INR, PROTIME in the last 168 hours. CBC: Recent Labs  Lab 10/01/21 1527 10/02/21 0534  WBC 9.2 9.3  HGB 12.2 10.9*  HCT 36.7 33.2*  MCV 92.9 95.4  PLT 410* 344   Cardiac Enzymes: No results for input(s): CKTOTAL, CKMB, CKMBINDEX, TROPONINI in the last 168 hours. BNP: Invalid input(s): POCBNP CBG: Recent Labs  Lab 10/01/21 1459  GLUCAP 100*   HbA1C: No results for input(s): HGBA1C in the last 72 hours. Urine analysis:    Component Value Date/Time   COLORURINE YELLOW 10/01/2021 2111   APPEARANCEUR CLEAR 10/01/2021 2111   LABSPEC 1.004 (L) 10/01/2021 2111   PHURINE 7.0 10/01/2021 2111   GLUCOSEU NEGATIVE 10/01/2021 2111   GLUCOSEU NEG mg/dL 12/24/2009 2317   HGBUR NEGATIVE 10/01/2021 2111   BILIRUBINUR NEGATIVE  10/01/2021 2111   KETONESUR NEGATIVE 10/01/2021 2111   PROTEINUR NEGATIVE 10/01/2021 2111   UROBILINOGEN 0.2 12/26/2013 1530   NITRITE NEGATIVE 10/01/2021 2111   LEUKOCYTESUR MODERATE (A) 10/01/2021 2111   Sepsis Labs: @LABRCNTIP (procalcitonin:4,lacticidven:4) )No results found for this or any previous visit (from the past 240 hour(s)).   Scheduled Meds:  enoxaparin (LOVENOX) injection  40 mg Subcutaneous Q24H   feeding supplement  237 mL Oral BID BM   levETIRAcetam  750 mg Oral BID   nicotine  21 mg Transdermal Daily   pantoprazole  80 mg Oral Daily   Continuous Infusions:  sodium chloride 125 mL/hr at 10/02/21 0042    Procedures/Studies: CT Head Wo Contrast  Result Date: 10/01/2021 CLINICAL DATA:  Provided history: Polytrauma, blunt. Mental status change, unknown cause. EXAM: CT HEAD WITHOUT CONTRAST CT CERVICAL SPINE WITHOUT CONTRAST TECHNIQUE: Multidetector CT imaging of the head and cervical spine was performed following the standard protocol without intravenous contrast. Multiplanar CT image reconstructions of the cervical spine were also generated. RADIATION DOSE REDUCTION: This exam was performed according to the departmental dose-optimization program which includes automated exposure control, adjustment of the mA and/or kV according to patient size and/or use of iterative reconstruction  technique. COMPARISON:  Prior head CT examinations 02/16/2021 and earlier. Cervical spine radiographs 01/14/2014 FINDINGS: CT HEAD FINDINGS Brain: No age advanced or lobar predominant parenchymal atrophy. There is no acute intracranial hemorrhage. No demarcated cortical infarct. No extra-axial fluid collection. No evidence of an intracranial mass. No midline shift. Vascular: No hyperdense vessel. Skull: No fracture or aggressive osseous lesion. Sinuses/Orbits: No mass or acute finding within the imaged orbits. Minimal mucosal thickening scattered within the bilateral ethmoid sinuses. Mild mucosal  thickening versus small mucous retention cyst within the right sphenoid sinus. CT CERVICAL SPINE FINDINGS Alignment: Reversal of the expected cervical lordosis. Trace C3-C4 grade 1 anterolisthesis. Skull base and vertebrae: The basion-dental and atlanto-dental intervals are maintained.No evidence of acute fracture to the cervical spine. Soft tissues and spinal canal: No prevertebral fluid or swelling. No visible canal hematoma. Disc levels: Cervical spondylosis with multilevel disc space narrowing, disc bulges, posterior disc osteophytes, endplate spurring and uncovertebral hypertrophy. Disc space narrowing is greatest at C5-C6 (moderate to advanced) and C6-C7 (advanced). A posterior disc osteophyte at C6-C7 contributes to apparent moderate spinal canal stenosis. Bony neural foraminal narrowing bilaterally at C5-C6 and C6-C7. Upper chest: No consolidation within the imaged lung apices. No visible pneumothorax. IMPRESSION: Head CT: 1. No evidence of acute intracranial abnormality. 2. Mild paranasal sinus disease, as described. CT cervical spine: 1. No evidence of acute fracture to the cervical spine. 2. Nonspecific reversal of the expected cervical lordosis. 3. Trace C3-C4 grade 1 anterolisthesis. 4. Cervical spondylosis, as described. Electronically Signed   By: Kellie Simmering D.O.   On: 10/01/2021 16:08   CT Cervical Spine Wo Contrast  Result Date: 10/01/2021 CLINICAL DATA:  Provided history: Polytrauma, blunt. Mental status change, unknown cause. EXAM: CT HEAD WITHOUT CONTRAST CT CERVICAL SPINE WITHOUT CONTRAST TECHNIQUE: Multidetector CT imaging of the head and cervical spine was performed following the standard protocol without intravenous contrast. Multiplanar CT image reconstructions of the cervical spine were also generated. RADIATION DOSE REDUCTION: This exam was performed according to the departmental dose-optimization program which includes automated exposure control, adjustment of the mA and/or kV  according to patient size and/or use of iterative reconstruction technique. COMPARISON:  Prior head CT examinations 02/16/2021 and earlier. Cervical spine radiographs 01/14/2014 FINDINGS: CT HEAD FINDINGS Brain: No age advanced or lobar predominant parenchymal atrophy. There is no acute intracranial hemorrhage. No demarcated cortical infarct. No extra-axial fluid collection. No evidence of an intracranial mass. No midline shift. Vascular: No hyperdense vessel. Skull: No fracture or aggressive osseous lesion. Sinuses/Orbits: No mass or acute finding within the imaged orbits. Minimal mucosal thickening scattered within the bilateral ethmoid sinuses. Mild mucosal thickening versus small mucous retention cyst within the right sphenoid sinus. CT CERVICAL SPINE FINDINGS Alignment: Reversal of the expected cervical lordosis. Trace C3-C4 grade 1 anterolisthesis. Skull base and vertebrae: The basion-dental and atlanto-dental intervals are maintained.No evidence of acute fracture to the cervical spine. Soft tissues and spinal canal: No prevertebral fluid or swelling. No visible canal hematoma. Disc levels: Cervical spondylosis with multilevel disc space narrowing, disc bulges, posterior disc osteophytes, endplate spurring and uncovertebral hypertrophy. Disc space narrowing is greatest at C5-C6 (moderate to advanced) and C6-C7 (advanced). A posterior disc osteophyte at C6-C7 contributes to apparent moderate spinal canal stenosis. Bony neural foraminal narrowing bilaterally at C5-C6 and C6-C7. Upper chest: No consolidation within the imaged lung apices. No visible pneumothorax. IMPRESSION: Head CT: 1. No evidence of acute intracranial abnormality. 2. Mild paranasal sinus disease, as described. CT cervical spine: 1. No evidence  of acute fracture to the cervical spine. 2. Nonspecific reversal of the expected cervical lordosis. 3. Trace C3-C4 grade 1 anterolisthesis. 4. Cervical spondylosis, as described. Electronically Signed    By: Kellie Simmering D.O.   On: 10/01/2021 16:08    Orson Eva, DO  [facility]  If 7PM-7AM, please contact night-coverage [email or web address] Password [email or web address] 10/02/2021, 9:03 AM   LOS: 0 days

## 2021-10-02 NOTE — Progress Notes (Signed)
   10/02/21 1500  What Happened  Was fall witnessed? No  Was patient injured? No  Patient found on floor  Found by Staff-comment  Stated prior activity ambulating-unassisted  Follow Up  MD notified Tat  Time MD notified 19  Family notified No - patient refusal (Has no family.  Neighbors who check on her visited room and are aware of her fall)  Additional tests No  Progress note created (see row info) Yes  Adult Fall Risk Assessment  Risk Factor Category (scoring not indicated) High fall risk per protocol (document High fall risk)  Patient Fall Risk Level High fall risk  Adult Fall Risk Interventions  Required Bundle Interventions *See Row Information* High fall risk - low, moderate, and high requirements implemented  Additional Interventions Use of appropriate toileting equipment (bedpan, BSC, etc.)  Screening for Fall Injury Risk (To be completed on HIGH fall risk patients) - Assessing Need for Floor Mats  Risk For Fall Injury- Criteria for Floor Mats Confusion/dementia (+NuDESC, CIWA, TBI, etc.)  Will Implement Floor Mats Yes  Pain Assessment  Pain Scale 0-10  Pain Score 6  Pain Type Other (Comment)  Pain Location Vagina (stated vagina hurst.  Assessment small scratch on inside of right labia.Slight blood on toilet paper when wiping)  Pain Orientation Right  Neurological  Neuro (WDL) X  Level of Consciousness Alert  Orientation Level Oriented to person;Oriented to place;Disoriented to time;Disoriented to situation  Cognition Poor judgement;Poor safety awareness  Speech Clear  R Pupil Size (mm) 2  R Pupil Shape Round  R Pupil Reaction Brisk  L Pupil Size (mm) 2  L Pupil Shape Round  L Pupil Reaction Brisk  Motor Function/Sensation Assessment Grip  R Hand Grip Moderate  L Hand Grip Moderate  Right Pronator Drift Absent  Left Pronator Drift Absent  R Foot Dorsiflexion Strong  L Foot Dorsiflexion Strong  R Foot Plantar Flexion Strong  L Foot Plantar Flexion Strong   Neuro Symptoms Forgetful  Reflexes  Gag Present  Glasgow Coma Scale  Eye Opening 4  Best Verbal Response (NON-intubated) 5  Best Motor Response 6  Glasgow Coma Scale Score 15  Musculoskeletal  Musculoskeletal (WDL) X  Assistive Device BSC  Generalized Weakness Yes  Integumentary  Integumentary (WDL) X  Skin Color Pale  Skin Condition Dry  Skin Integrity Abrasion;Ecchymosis;Rash  Abrasion Location Arm  Abrasion Location Orientation Bilateral  Ecchymosis Location Eye  Ecchymosis Location Orientation Left  Erythema/Redness Location Sacrum  Erythema/Redness Location Orientation Bilateral  Rash Location Leg  Rash Location Orientation Bilateral  Skin Turgor Non-tenting

## 2021-10-02 NOTE — Plan of Care (Signed)
  Problem: Acute Rehab PT Goals(only PT should resolve) Goal: Pt Will Go Supine/Side To Sit Outcome: Progressing Flowsheets (Taken 10/02/2021 1109) Pt will go Supine/Side to Sit:  with supervision  with min guard assist Goal: Patient Will Transfer Sit To/From Stand Outcome: Progressing Flowsheets (Taken 10/02/2021 1109) Patient will transfer sit to/from stand:  with supervision  with min guard assist Goal: Pt Will Transfer Bed To Chair/Chair To Bed Outcome: Progressing Flowsheets (Taken 10/02/2021 1109) Pt will Transfer Bed to Chair/Chair to Bed:  min guard assist  with min assist Goal: Pt Will Perform Standing Balance Or Pre-Gait Outcome: Progressing Flowsheets (Taken 10/02/2021 1109) Pt will perform standing balance or pre-gait:  1-2 min  with bilateral UE support  with Supervision  with min guard assist Goal: Pt Will Ambulate Outcome: Progressing Flowsheets (Taken 10/02/2021 1109) Pt will Ambulate:  > 125 feet  with rolling walker  with min guard assist  with supervision   11:09 AM, 10/02/21 Lestine Box, S/PT

## 2021-10-02 NOTE — Assessment & Plan Note (Addendum)
Likely multifactorial including polypharmacy, xanax overuse, UTI, and possibly low folate ---pt only had 10 tabs of xanax left in her bottle of 90 that was filled on 09/17/21 -EEG--normal Check B12--616 Check TSH--1.340 Ammonia 16 UA 21-50 WBC>> obtain urine culture>>pending at time of dc ---d/c home with 5 days empiric cefdinir Check folic IVHS--9.2>>>TGRMB folic acid supplementation UDS positive for THC and benzodiazepines MR brain--neg for acute findings;  Loss of the expected flow void of portions of the superior sagittalsinus and the left transverse and sigmoid sinuses. This may reflect slow flow;  Will need outpatient MRV--to rule out cerebral venous thrombus --clinically No motor deficit, seizure, cranial nerve palsy, or vision impairment currently to suggest 10/03/21--mental status much improved

## 2021-10-02 NOTE — Procedures (Signed)
Patient Name: [name]  MRN: [id number]  Epilepsy Attending: Lora Havens  Referring Physician/Provider:  Date: 10/02/2021 Duration: 22.27 mins  Patient history: 52yo f with ams. EEG to evaluate for seizure.  Level of alertness: Awake  AEDs during EEG study: LEV  Technical aspects: This EEG study was done with scalp electrodes positioned according to the 10-20 International system of electrode placement. Electrical activity was acquired at a sampling rate of 500Hz  and reviewed with a high frequency filter of 70Hz  and a low frequency filter of 1Hz . EEG data were recorded continuously and digitally stored.   Description: The posterior dominant rhythm consists of 9 Hz activity of moderate voltage (25-35 uV) seen predominantly in posterior head regions, symmetric and reactive to eye opening and eye closing.  Hyperventilation and photic stimulation were not performed.     IMPRESSION: This study is within normal limits. No seizures or epileptiform discharges were seen throughout the recording.  Karlon Schlafer [name]

## 2021-10-02 NOTE — Progress Notes (Signed)
Radiology called out to nurses station that pt was in the floor. This nurse went to pts room to assess the situation. Pt found sitting in the floor on her bottom with both hands flat on the floor. IV still infusing. No visible injuries noted. Pt states she was trying to go to the bathroom but couldn't go any further because of IV tubing length, she states she sat on the floor and did not hit her head. pt states her back hurts and says her back hurts all the time. Charge nurse along side this nurse at this time. Pt stood with no issues and sat on bedside commode, then ambulated to bed and assessed by both nurses for any injuries/bruising, none noted at this time. Family bedside at this time. Pt has on non-slip socks.

## 2021-10-02 NOTE — Assessment & Plan Note (Signed)
Patient follows with Dr. Jenetta Downer -- Last appointment on 09/07/2021 -- Instructed to continue Apriso and follow-up at [facility] 10/15/21--Dr. Nyoka Cowden -- Patient also has an appointment at [facility] 01/18/2022 -- She was given short course of methylprednisolone -- 07/15/2021 colonoscopy noncritical rectal stricture, severe inflammation of the distal sigmoid and rectum.  Biopsy pathology shows chronic active colitis with ulceration and necroinflammatory debris -- Patient normally has 4-5 bowel movements on a daily basis with intermittent hematochezia Appreciate GI consult>>no further therapy recommendations presently

## 2021-10-02 NOTE — Progress Notes (Signed)
[name] who has been at pt bedside is requesting a note for work dr. Carles Collet brought not to bedside

## 2021-10-03 DIAGNOSIS — E876 Hypokalemia: Secondary | ICD-10-CM

## 2021-10-03 DIAGNOSIS — R2681 Unsteadiness on feet: Secondary | ICD-10-CM

## 2021-10-03 DIAGNOSIS — K50011 Crohn's disease of small intestine with rectal bleeding: Secondary | ICD-10-CM

## 2021-10-03 DIAGNOSIS — R569 Unspecified convulsions: Secondary | ICD-10-CM

## 2021-10-03 DIAGNOSIS — D75839 Thrombocytosis, unspecified: Secondary | ICD-10-CM

## 2021-10-03 DIAGNOSIS — G9341 Metabolic encephalopathy: Secondary | ICD-10-CM

## 2021-10-03 LAB — RPR: RPR Ser Ql: NONREACTIVE

## 2021-10-03 MED ORDER — FOLIC ACID 1 MG PO TABS
1.0000 mg | ORAL_TABLET | Freq: Every day | ORAL | Status: DC
  Administered 2021-10-03: 1 mg via ORAL
  Filled 2021-10-03: qty 1

## 2021-10-03 MED ORDER — TRIAMCINOLONE ACETONIDE 0.1 % EX CREA
TOPICAL_CREAM | Freq: Two times a day (BID) | CUTANEOUS | Status: DC
Start: 2021-10-03 — End: 2021-10-03
  Filled 2021-10-03: qty 15

## 2021-10-03 MED ORDER — FOLIC ACID 1 MG PO TABS: 1.0000 mg | ORAL_TABLET | Freq: Every day | ORAL | Status: DC

## 2021-10-03 MED ORDER — CEFDINIR 300 MG PO CAPS
300.0000 mg | ORAL_CAPSULE | Freq: Two times a day (BID) | ORAL | Status: DC
  Administered 2021-10-03: 300 mg via ORAL
  Filled 2021-10-03: qty 1

## 2021-10-03 MED ORDER — TRIAMCINOLONE ACETONIDE 0.1 % EX CREA: TOPICAL_CREAM | Freq: Two times a day (BID) | CUTANEOUS | 1 refills | Status: DC

## 2021-10-03 MED ORDER — CEFDINIR 300 MG PO CAPS: 300.0000 mg | ORAL_CAPSULE | Freq: Two times a day (BID) | ORAL | 0 refills | Status: DC

## 2021-10-03 NOTE — Discharge Summary (Signed)
Physician Discharge Summary   Patient: [name] MRN: [id number] DOB: [birth year]  Admit date:     10/01/2021  Discharge date: 10/03/21  Discharge Physician: Shanon Brow Estefano Victory   PCP: Teodoro Kil, PA-C   Recommendations at discharge:   Please follow up with primary care provider within 1-2 weeks  Please repeat BMP and CBC in one week    Hospital Course: 51 year old female with a history of COPD, Crohn's disease, hypertension, diet-controlled diabetes mellitus, seizure, tobacco abuse, PTSD, and chronic abdominal pain presenting with altered mental status.  Review of the medical record shows that the patient has been dealing with chronic abdominal pain as well as rashes in her upper and lower extremities which has been attributed to reactions due to her anti-TNF treatment.  Apparently, the patient arrived by private vehicle to the ED confused.  She did not know why she was there. History is supplemented by patient's close friends.  Apparently the patient has had increasing falls and unsteady gait for the better part of the month.  She has not lost consciousness.  Apparently, the patient has had increasing confusion for the last 3 to 4 weeks.  She has not overused any of her hypnotic medications.  She does not drink alcohol.  She continues to smoke 1/2 pack/day. ED Course:  In the emergency department, BP was soft at 93/68, she was initially tachycardic, but this has since normalized, other vital signs within normal range.  Work-up in the ED showed normal CBC except for thrombocytosis.  BMP showed hyponatremia, hypokalemia.  Magnesium 2.2.  Albumin 2.1, ALP 132, alcohol level was less than 10, ammonia 16, urine drug screen was positive for benzodiazepine and THC.  Urinalysis was unimpressive for UTI CT head without contrast showed no evidence of acute intracranial abnormality CT cervical spine without contrast showed no evidence of acute fracture to the cervical spine.  Nonspecific reversal of the  expected cervical lordosis.  Trace C3-C4 grade 1 anterolisthesis IV hydration was provided, potassium was replenished    Assessment and Plan: * Acute metabolic encephalopathy Likely multifactorial including polypharmacy, xanax overuse, UTI, and possibly low folate ---pt only had 10 tabs of xanax left in her bottle of 90 that was filled on 09/17/21 -EEG--normal Check B12--616 Check TSH--1.340 Ammonia 16 UA 21-50 WBC>> obtain urine culture>>pending at time of dc ---d/c home with 5 days empiric cefdinir Check folic UXNA--3.5>>>TDDUK folic acid supplementation UDS positive for THC and benzodiazepines MR brain--neg for acute findings;  Loss of the expected flow void of portions of the superior sagittalsinus and the left transverse and sigmoid sinuses. This may reflect slow flow;  Will need outpatient MRV--to rule out cerebral venous thrombus --clinically No motor deficit, seizure, cranial nerve palsy, or vision impairment currently to suggest 10/03/21--mental status much improved   Gait instability MRI brain as discussed above Check G25, folic acid, RPR, TSH--as above Suspect polypharmacy and use of hypnotic medications PT/OT>> home health PT and OT Urged patient to use assistive devices  Thrombocytosis Likely secondary to stress demargination Improved on day of d/c  Hypokalemia Repleted Magnesium 2.2  Seizure ([place]) Continue Keppra Obtain EEG--neg for seizure  Crohn's disease [facility]) Patient follows with Dr. Jenetta Downer -- Last appointment on 09/07/2021 -- Instructed to continue Apriso and follow-up at [facility] 10/15/21--Dr. Nyoka Cowden -- Patient also has an appointment [facility] 01/18/[facility] -- She was given short course of methylprednisolone -- 07/15/2021 colonoscopy noncritical rectal stricture, severe inflammation of the distal sigmoid and rectum.  Biopsy pathology shows chronic active  colitis with ulceration and necroinflammatory debris -- Patient normally has 4-5 bowel movements on  a daily basis with intermittent hematochezia Appreciate GI consult>>no further therapy recommendations presently  COPD (chronic obstructive pulmonary disease) (HCC) Stable on room air  GERD (gastroesophageal reflux disease) Continue pantoprazole  TOBACCO ABUSE Tobacco cessation discussed         Consultants: GI Procedures performed: none  Disposition: Home Diet recommendation:  Cardiac diet DISCHARGE MEDICATION: Allergies as of 10/03/2021       Reactions   Codeine Shortness Of Breath, Swelling, Rash   Throat swelling   Dilaudid [hydromorphone Hcl] Shortness Of Breath, Swelling   Hydrocodone Shortness Of Breath, Swelling, Rash   Morphine And Related Shortness Of Breath, Swelling, Rash   Oxycodone Shortness Of Breath, Swelling, Rash   Patient states ALL pain medications make her deathly sick.   Penicillins Shortness Of Breath, Swelling, Other (See Comments)   Has patient had a PCN reaction causing immediate rash, facial/tongue/throat swelling, SOB or lightheadedness with hypotension: Yes Has patient had a PCN reaction causing severe rash involving mucus membranes or skin necrosis: Yes Has patient had a PCN reaction that required hospitalization Yes Has patient had a PCN reaction occurring within the last 10 years: No If all of the above answers are "NO", then may proceed with Cephalosporin use. Throat swells 02/06/21--TOLERATES CEFTRIAXONE    Acetaminophen    Per MD patient states she can't take Tylenol because of liver enzymes   Ativan [lorazepam] Other (See Comments)   Migraines.   Strawberry Extract Swelling   Watermelon Concentrate Swelling   Albuterol Palpitations   Benadryl [diphenhydramine Hcl] Palpitations   Latex Rash   Remicade [infliximab] Rash   Blisters and Welts        Medication List     TAKE these medications    albuterol 108 (90 Base) MCG/ACT inhaler Commonly known as: VENTOLIN HFA Inhale 2 puffs into the lungs every 4 (four) hours as needed  for wheezing or shortness of breath.   ALPRAZolam 1 MG tablet Commonly known as: XANAX Take 1 mg by mouth 3 (three) times daily as needed for anxiety.   amitriptyline 75 MG tablet Commonly known as: ELAVIL Take 100 mg by mouth at bedtime.   cefdinir 300 MG capsule Commonly known as: OMNICEF Take 1 capsule (300 mg total) by mouth every 12 (twelve) hours.   cyclobenzaprine 10 MG tablet Commonly known as: FLEXERIL Take 10 mg by mouth 3 (three) times daily as needed for muscle spasms.   folic acid 1 MG tablet Commonly known as: FOLVITE Take 1 tablet (1 mg total) by mouth daily.   hydrOXYzine 10 MG tablet Commonly known as: ATARAX Take 1 tablet (10 mg total) by mouth at bedtime. What changed:  how much to take when to take this reasons to take this   levETIRAcetam 750 MG tablet Commonly known as: KEPPRA Take 750 mg by mouth 2 (two) times daily.   nitroGLYCERIN 0.4 MG SL tablet Commonly known as: NITROSTAT Place 1 tablet (0.4 mg total) under the tongue every 5 (five) minutes as needed for chest pain.   omeprazole 40 MG capsule Commonly known as: PRILOSEC Take 40 mg by mouth daily.   ondansetron 4 MG disintegrating tablet Commonly known as: ZOFRAN-ODT Take 4 mg by mouth every 8 (eight) hours as needed for nausea or vomiting.   Restasis 0.05 % ophthalmic emulsion Generic drug: cycloSPORINE Place 1 drop into both eyes 2 (two) times daily.   Spiriva Respimat 2.5 MCG/ACT Aers  Generic drug: Tiotropium Bromide Monohydrate Inhale 1 puff into the lungs daily as needed (shortness of breath). What changed: when to take this   triamcinolone cream 0.1 % Commonly known as: KENALOG Apply topically 2 (two) times daily.               Durable Medical Equipment  (From admission, onward)           Start     Ordered   10/02/21 1212  For home use only DME Walker rolling  Once       Question Answer Comment  Walker: With 5 Inch Wheels   Patient needs a walker to treat  with the following condition Gait difficulty      10/02/21 1215   10/02/21 0920  For home use only DME 3 n 1  Once       Comments: Pt has Crohn's disease and has have several instances of falls in the past 6 months. Pt reports decrease in mobility on "bad" days. Bedside commode should reduce fall risk at home.  [place] OT, MOT   10/02/21 0920            Discharge Exam: Filed Weights   10/01/21 1457 10/02/21 0009  Weight: 54.4 kg 52.7 kg   HEENT:  [place]/AT, No thrush, no icterus CV:  RRR, no rub, no S3, no S4 Lung:  CTA, no wheeze, no rhonchi Abd:  soft/+BS, mild diffuse tender Ext:  No edema, no lymphangitis, no synovitis, no rash Neuro:  CN II-XII intact, strength 4/5 in RUE, RLE, strength 4/5 LUE, LLE; sensation intact bilateral; no dysmetria; babinski equivocal    Condition at discharge: stable  The results of significant diagnostics from this hospitalization (including imaging, microbiology, ancillary and laboratory) are listed below for reference.   Imaging Studies: CT Head Wo Contrast  Result Date: 10/01/2021 CLINICAL DATA:  Provided history: Polytrauma, blunt. Mental status change, unknown cause. EXAM: CT HEAD WITHOUT CONTRAST CT CERVICAL SPINE WITHOUT CONTRAST TECHNIQUE: Multidetector CT imaging of the head and cervical spine was performed following the standard protocol without intravenous contrast. Multiplanar CT image reconstructions of the cervical spine were also generated. RADIATION DOSE REDUCTION: This exam was performed according to the departmental dose-optimization program which includes automated exposure control, adjustment of the mA and/or kV according to patient size and/or use of iterative reconstruction technique. COMPARISON:  Prior head CT examinations 02/16/2021 and earlier. Cervical spine radiographs 01/14/2014 FINDINGS: CT HEAD FINDINGS Brain: No age advanced or lobar predominant parenchymal atrophy. There is no acute intracranial hemorrhage.  No demarcated cortical infarct. No extra-axial fluid collection. No evidence of an intracranial mass. No midline shift. Vascular: No hyperdense vessel. Skull: No fracture or aggressive osseous lesion. Sinuses/Orbits: No mass or acute finding within the imaged orbits. Minimal mucosal thickening scattered within the bilateral ethmoid sinuses. Mild mucosal thickening versus small mucous retention cyst within the right sphenoid sinus. CT CERVICAL SPINE FINDINGS Alignment: Reversal of the expected cervical lordosis. Trace C3-C4 grade 1 anterolisthesis. Skull base and vertebrae: The basion-dental and atlanto-dental intervals are maintained.No evidence of acute fracture to the cervical spine. Soft tissues and spinal canal: No prevertebral fluid or swelling. No visible canal hematoma. Disc levels: Cervical spondylosis with multilevel disc space narrowing, disc bulges, posterior disc osteophytes, endplate spurring and uncovertebral hypertrophy. Disc space narrowing is greatest at C5-C6 (moderate to advanced) and C6-C7 (advanced). A posterior disc osteophyte at C6-C7 contributes to apparent moderate spinal canal stenosis. Bony neural foraminal narrowing bilaterally at C5-C6 and C6-C7.  Upper chest: No consolidation within the imaged lung apices. No visible pneumothorax. IMPRESSION: Head CT: 1. No evidence of acute intracranial abnormality. 2. Mild paranasal sinus disease, as described. CT cervical spine: 1. No evidence of acute fracture to the cervical spine. 2. Nonspecific reversal of the expected cervical lordosis. 3. Trace C3-C4 grade 1 anterolisthesis. 4. Cervical spondylosis, as described. Electronically Signed   By: Kellie Simmering D.O.   On: 10/01/2021 16:08   CT Cervical Spine Wo Contrast  Result Date: 10/01/2021 CLINICAL DATA:  Provided history: Polytrauma, blunt. Mental status change, unknown cause. EXAM: CT HEAD WITHOUT CONTRAST CT CERVICAL SPINE WITHOUT CONTRAST TECHNIQUE: Multidetector CT imaging of the head and  cervical spine was performed following the standard protocol without intravenous contrast. Multiplanar CT image reconstructions of the cervical spine were also generated. RADIATION DOSE REDUCTION: This exam was performed according to the departmental dose-optimization program which includes automated exposure control, adjustment of the mA and/or kV according to patient size and/or use of iterative reconstruction technique. COMPARISON:  Prior head CT examinations 02/16/2021 and earlier. Cervical spine radiographs 01/14/2014 FINDINGS: CT HEAD FINDINGS Brain: No age advanced or lobar predominant parenchymal atrophy. There is no acute intracranial hemorrhage. No demarcated cortical infarct. No extra-axial fluid collection. No evidence of an intracranial mass. No midline shift. Vascular: No hyperdense vessel. Skull: No fracture or aggressive osseous lesion. Sinuses/Orbits: No mass or acute finding within the imaged orbits. Minimal mucosal thickening scattered within the bilateral ethmoid sinuses. Mild mucosal thickening versus small mucous retention cyst within the right sphenoid sinus. CT CERVICAL SPINE FINDINGS Alignment: Reversal of the expected cervical lordosis. Trace C3-C4 grade 1 anterolisthesis. Skull base and vertebrae: The basion-dental and atlanto-dental intervals are maintained.No evidence of acute fracture to the cervical spine. Soft tissues and spinal canal: No prevertebral fluid or swelling. No visible canal hematoma. Disc levels: Cervical spondylosis with multilevel disc space narrowing, disc bulges, posterior disc osteophytes, endplate spurring and uncovertebral hypertrophy. Disc space narrowing is greatest at C5-C6 (moderate to advanced) and C6-C7 (advanced). A posterior disc osteophyte at C6-C7 contributes to apparent moderate spinal canal stenosis. Bony neural foraminal narrowing bilaterally at C5-C6 and C6-C7. Upper chest: No consolidation within the imaged lung apices. No visible pneumothorax.  IMPRESSION: Head CT: 1. No evidence of acute intracranial abnormality. 2. Mild paranasal sinus disease, as described. CT cervical spine: 1. No evidence of acute fracture to the cervical spine. 2. Nonspecific reversal of the expected cervical lordosis. 3. Trace C3-C4 grade 1 anterolisthesis. 4. Cervical spondylosis, as described. Electronically Signed   By: Kellie Simmering D.O.   On: 10/01/2021 16:08   MR BRAIN WO CONTRAST  Result Date: 10/02/2021 CLINICAL DATA:  Mental status change, unknown cause EXAM: MRI HEAD WITHOUT CONTRAST TECHNIQUE: Multiplanar, multiecho pulse sequences of the brain and surrounding structures were obtained without intravenous contrast. COMPARISON:  2012 FINDINGS: Brain: There is no acute infarction or intracranial hemorrhage. There is no intracranial mass, mass effect, or edema. There is no hydrocephalus or extra-axial fluid collection. Ventricles and sulci are normal in size and configuration. Vascular: Major arterial flow voids at the skull base are preserved. Loss of the expected flow void of portions of the superior sagittal sinus and the left transverse and sigmoid sinuses is new from the 2012 study. Skull and upper cervical spine: Normal marrow signal is preserved. Sinuses/Orbits: Paranasal sinuses are aerated. Orbits are unremarkable. Other: Sella is unremarkable.  Mastoid air cells are clear. IMPRESSION: No acute infarction, hemorrhage, or mass. Loss of the expected flow void  of portions of the superior sagittal sinus and the left transverse and sigmoid sinuses. This may reflect slow flow. MRV with contrast recommended to exclude thrombus. Electronically Signed   By: Macy Mis M.D.   On: 10/02/2021 12:34   EEG adult  Result Date: 10/02/2021 Lora Havens, MD     10/02/2021  6:50 PM Patient Name: [name] MRN: [id number] Epilepsy Attending: Lora Havens Referring Physician/Provider: Date: 10/02/2021 Duration: 22.27 mins Patient history: 51yo f with ams. EEG to evaluate  for seizure. Level of alertness: Awake AEDs during EEG study: LEV Technical aspects: This EEG study was done with scalp electrodes positioned according to the 10-20 International system of electrode placement. Electrical activity was acquired at a sampling rate of 500Hz  and reviewed with a high frequency filter of 70Hz  and a low frequency filter of 1Hz . EEG data were recorded continuously and digitally stored. Description: The posterior dominant rhythm consists of 9 Hz activity of moderate voltage (25-35 uV) seen predominantly in posterior head regions, symmetric and reactive to eye opening and eye closing.  Hyperventilation and photic stimulation were not performed.   IMPRESSION: This study is within normal limits. No seizures or epileptiform discharges were seen throughout the recording. Lora Havens    Microbiology: Results for orders placed or performed in visit on 04/30/21  CALPROTECTIN     Status: Abnormal   Collection Time: 06/16/21  2:38 PM  Result Value Ref Range Status   Calprotectin 1,230 (H) mcg/g Final    Comment:                                       Reference Range:                                       <50     Normal                                       50-120  Borderline                                       >120    Elevated . Calprotectin in Crohn's disease and ulcerative colitis can be five to several thousand times above the reference population (50 mcg/g or less). Levels are usually 50 mcg/g or less in healthy patients and with irritable bowel syndrome. Repeat testing in 4-6 weeks is suggested for borderline values.     Labs: CBC: Recent Labs  Lab 10/01/21 1527 10/02/21 0534  WBC 9.2 9.3  HGB 12.2 10.9*  HCT 36.7 33.2*  MCV 92.9 95.4  PLT 410* 675   Basic Metabolic Panel: Recent Labs  Lab 10/01/21 1527 10/02/21 0620  NA 134* 138  K 2.3* 3.9  CL 93* 109  CO2 31 22  GLUCOSE 101* 81  BUN 7 6  CREATININE 0.93 0.76  CALCIUM 7.9* 7.2*  MG 2.2 2.3   PHOS  --  2.8   Liver Function Tests: Recent Labs  Lab 10/01/21 1527 10/02/21 0620  AST 24 24  ALT 21 14  ALKPHOS 132* 113  BILITOT 0.5 0.7  PROT 5.6* 4.6*  ALBUMIN 2.1* 1.6*   CBG: Recent Labs  Lab 10/01/21 1459  GLUCAP 100*    Discharge time spent: greater than 30 minutes.  Signed: Orson Eva, MD [facility] 10/03/2021

## 2021-10-03 NOTE — Progress Notes (Signed)
Pt discharged home with family in stable condition. Discharge instructions given. Meds returned from pharmacy and scripts sent to pharmacy of choice. No immediate questions or concerns at this time.

## 2021-10-05 LAB — URINE CULTURE: Culture: >=100000 — AB

## 2021-10-29 ENCOUNTER — Ambulatory Visit (HOSPITAL_COMMUNITY): Payer: 59 | Admitting: Physical Therapy

## 2021-11-16 ENCOUNTER — Ambulatory Visit (INDEPENDENT_AMBULATORY_CARE_PROVIDER_SITE_OTHER): Payer: 59 | Admitting: Gastroenterology

## 2021-11-17 ENCOUNTER — Ambulatory Visit (HOSPITAL_COMMUNITY): Payer: 59 | Attending: Internal Medicine | Admitting: Physical Therapy

## 2021-12-09 DIAGNOSIS — K50919 Crohn's disease, unspecified, with unspecified complications: Secondary | ICD-10-CM | POA: Diagnosis not present

## 2021-12-09 DIAGNOSIS — K6289 Other specified diseases of anus and rectum: Secondary | ICD-10-CM | POA: Diagnosis not present

## 2021-12-09 DIAGNOSIS — R000 Tachycardia, unspecified: Secondary | ICD-10-CM | POA: Diagnosis not present

## 2021-12-09 DIAGNOSIS — R197 Diarrhea, unspecified: Secondary | ICD-10-CM | POA: Diagnosis not present

## 2021-12-09 DIAGNOSIS — R109 Unspecified abdominal pain: Secondary | ICD-10-CM | POA: Diagnosis not present

## 2021-12-09 DIAGNOSIS — R634 Abnormal weight loss: Secondary | ICD-10-CM | POA: Diagnosis not present

## 2021-12-09 DIAGNOSIS — R0789 Other chest pain: Secondary | ICD-10-CM | POA: Diagnosis not present

## 2021-12-10 DIAGNOSIS — K50919 Crohn's disease, unspecified, with unspecified complications: Secondary | ICD-10-CM | POA: Diagnosis not present

## 2021-12-10 DIAGNOSIS — R109 Unspecified abdominal pain: Secondary | ICD-10-CM | POA: Diagnosis not present

## 2021-12-11 DIAGNOSIS — K529 Noninfective gastroenteritis and colitis, unspecified: Secondary | ICD-10-CM | POA: Diagnosis not present

## 2021-12-11 DIAGNOSIS — R69 Illness, unspecified: Secondary | ICD-10-CM | POA: Diagnosis not present

## 2021-12-11 DIAGNOSIS — K626 Ulcer of anus and rectum: Secondary | ICD-10-CM | POA: Diagnosis not present

## 2021-12-11 DIAGNOSIS — K50819 Crohn's disease of both small and large intestine with unspecified complications: Secondary | ICD-10-CM | POA: Diagnosis not present

## 2021-12-11 DIAGNOSIS — R109 Unspecified abdominal pain: Secondary | ICD-10-CM | POA: Diagnosis not present

## 2021-12-11 DIAGNOSIS — K5010 Crohn's disease of large intestine without complications: Secondary | ICD-10-CM | POA: Diagnosis not present

## 2021-12-11 DIAGNOSIS — R634 Abnormal weight loss: Secondary | ICD-10-CM | POA: Diagnosis not present

## 2021-12-11 DIAGNOSIS — J449 Chronic obstructive pulmonary disease, unspecified: Secondary | ICD-10-CM | POA: Diagnosis not present

## 2021-12-11 DIAGNOSIS — M1990 Unspecified osteoarthritis, unspecified site: Secondary | ICD-10-CM | POA: Diagnosis not present

## 2021-12-12 DIAGNOSIS — K50919 Crohn's disease, unspecified, with unspecified complications: Secondary | ICD-10-CM | POA: Diagnosis not present

## 2021-12-12 DIAGNOSIS — R69 Illness, unspecified: Secondary | ICD-10-CM | POA: Diagnosis not present

## 2021-12-12 DIAGNOSIS — R109 Unspecified abdominal pain: Secondary | ICD-10-CM | POA: Diagnosis not present

## 2021-12-23 ENCOUNTER — Inpatient Hospital Stay (HOSPITAL_COMMUNITY): Payer: 59

## 2021-12-23 ENCOUNTER — Other Ambulatory Visit: Payer: Self-pay

## 2021-12-23 ENCOUNTER — Inpatient Hospital Stay (HOSPITAL_COMMUNITY)
Admission: EM | Admit: 2021-12-23 | Discharge: 2022-01-03 | DRG: 871 | Disposition: A | Discharge status: Home / Self Care | Payer: 59 | Attending: Internal Medicine | Admitting: Internal Medicine

## 2021-12-23 ENCOUNTER — Emergency Department (HOSPITAL_COMMUNITY): Payer: 59

## 2021-12-23 ENCOUNTER — Encounter (HOSPITAL_COMMUNITY): Payer: Self-pay

## 2021-12-23 DIAGNOSIS — R404 Transient alteration of awareness: Secondary | ICD-10-CM | POA: Diagnosis not present

## 2021-12-23 DIAGNOSIS — R6521 Severe sepsis with septic shock: Secondary | ICD-10-CM | POA: Diagnosis not present

## 2021-12-23 DIAGNOSIS — B9620 Unspecified Escherichia coli [E. coli] as the cause of diseases classified elsewhere: Secondary | ICD-10-CM | POA: Diagnosis present

## 2021-12-23 DIAGNOSIS — E8770 Fluid overload, unspecified: Secondary | ICD-10-CM | POA: Diagnosis not present

## 2021-12-23 DIAGNOSIS — E875 Hyperkalemia: Secondary | ICD-10-CM | POA: Diagnosis not present

## 2021-12-23 DIAGNOSIS — Y718 Miscellaneous cardiovascular devices associated with adverse incidents, not elsewhere classified: Secondary | ICD-10-CM | POA: Diagnosis not present

## 2021-12-23 DIAGNOSIS — N390 Urinary tract infection, site not specified: Secondary | ICD-10-CM | POA: Diagnosis present

## 2021-12-23 DIAGNOSIS — F17210 Nicotine dependence, cigarettes, uncomplicated: Secondary | ICD-10-CM | POA: Diagnosis present

## 2021-12-23 DIAGNOSIS — I959 Hypotension, unspecified: Secondary | ICD-10-CM | POA: Diagnosis not present

## 2021-12-23 DIAGNOSIS — Z91040 Latex allergy status: Secondary | ICD-10-CM

## 2021-12-23 DIAGNOSIS — T82868A Thrombosis of vascular prosthetic devices, implants and grafts, initial encounter: Secondary | ICD-10-CM | POA: Diagnosis not present

## 2021-12-23 DIAGNOSIS — G9341 Metabolic encephalopathy: Secondary | ICD-10-CM | POA: Diagnosis not present

## 2021-12-23 DIAGNOSIS — E872 Acidosis, unspecified: Secondary | ICD-10-CM | POA: Diagnosis not present

## 2021-12-23 DIAGNOSIS — A0472 Enterocolitis due to Clostridium difficile, not specified as recurrent: Secondary | ICD-10-CM | POA: Diagnosis present

## 2021-12-23 DIAGNOSIS — Z1611 Resistance to penicillins: Secondary | ICD-10-CM | POA: Diagnosis present

## 2021-12-23 DIAGNOSIS — I82611 Acute embolism and thrombosis of superficial veins of right upper extremity: Secondary | ICD-10-CM | POA: Diagnosis not present

## 2021-12-23 DIAGNOSIS — F329 Major depressive disorder, single episode, unspecified: Secondary | ICD-10-CM | POA: Diagnosis present

## 2021-12-23 DIAGNOSIS — Z886 Allergy status to analgesic agent status: Secondary | ICD-10-CM

## 2021-12-23 DIAGNOSIS — R6520 Severe sepsis without septic shock: Secondary | ICD-10-CM

## 2021-12-23 DIAGNOSIS — N179 Acute kidney failure, unspecified: Secondary | ICD-10-CM | POA: Diagnosis not present

## 2021-12-23 DIAGNOSIS — K529 Noninfective gastroenteritis and colitis, unspecified: Secondary | ICD-10-CM | POA: Diagnosis not present

## 2021-12-23 DIAGNOSIS — Z20822 Contact with and (suspected) exposure to covid-19: Secondary | ICD-10-CM | POA: Diagnosis not present

## 2021-12-23 DIAGNOSIS — K51219 Ulcerative (chronic) proctitis with unspecified complications: Secondary | ICD-10-CM | POA: Diagnosis not present

## 2021-12-23 DIAGNOSIS — R112 Nausea with vomiting, unspecified: Secondary | ICD-10-CM

## 2021-12-23 DIAGNOSIS — E876 Hypokalemia: Secondary | ICD-10-CM | POA: Diagnosis not present

## 2021-12-23 DIAGNOSIS — Z87828 Personal history of other (healed) physical injury and trauma: Secondary | ICD-10-CM

## 2021-12-23 DIAGNOSIS — E119 Type 2 diabetes mellitus without complications: Secondary | ICD-10-CM | POA: Diagnosis present

## 2021-12-23 DIAGNOSIS — K625 Hemorrhage of anus and rectum: Secondary | ICD-10-CM | POA: Diagnosis not present

## 2021-12-23 DIAGNOSIS — N3000 Acute cystitis without hematuria: Secondary | ICD-10-CM

## 2021-12-23 DIAGNOSIS — F4310 Post-traumatic stress disorder, unspecified: Secondary | ICD-10-CM | POA: Diagnosis present

## 2021-12-23 DIAGNOSIS — J449 Chronic obstructive pulmonary disease, unspecified: Secondary | ICD-10-CM | POA: Diagnosis present

## 2021-12-23 DIAGNOSIS — E860 Dehydration: Secondary | ICD-10-CM | POA: Diagnosis present

## 2021-12-23 DIAGNOSIS — Z1619 Resistance to other specified beta lactam antibiotics: Secondary | ICD-10-CM | POA: Diagnosis not present

## 2021-12-23 DIAGNOSIS — R197 Diarrhea, unspecified: Secondary | ICD-10-CM | POA: Diagnosis not present

## 2021-12-23 DIAGNOSIS — R9431 Abnormal electrocardiogram [ECG] [EKG]: Secondary | ICD-10-CM | POA: Diagnosis not present

## 2021-12-23 DIAGNOSIS — Z79899 Other long term (current) drug therapy: Secondary | ICD-10-CM

## 2021-12-23 DIAGNOSIS — K219 Gastro-esophageal reflux disease without esophagitis: Secondary | ICD-10-CM | POA: Diagnosis not present

## 2021-12-23 DIAGNOSIS — K922 Gastrointestinal hemorrhage, unspecified: Secondary | ICD-10-CM | POA: Diagnosis not present

## 2021-12-23 DIAGNOSIS — Z452 Encounter for adjustment and management of vascular access device: Secondary | ICD-10-CM | POA: Diagnosis not present

## 2021-12-23 DIAGNOSIS — R569 Unspecified convulsions: Secondary | ICD-10-CM

## 2021-12-23 DIAGNOSIS — K6289 Other specified diseases of anus and rectum: Secondary | ICD-10-CM

## 2021-12-23 DIAGNOSIS — K5010 Crohn's disease of large intestine without complications: Secondary | ICD-10-CM | POA: Diagnosis not present

## 2021-12-23 DIAGNOSIS — K5090 Crohn's disease, unspecified, without complications: Secondary | ICD-10-CM | POA: Diagnosis present

## 2021-12-23 DIAGNOSIS — I82621 Acute embolism and thrombosis of deep veins of right upper extremity: Secondary | ICD-10-CM | POA: Diagnosis present

## 2021-12-23 DIAGNOSIS — Z833 Family history of diabetes mellitus: Secondary | ICD-10-CM

## 2021-12-23 DIAGNOSIS — Z90710 Acquired absence of both cervix and uterus: Secondary | ICD-10-CM

## 2021-12-23 DIAGNOSIS — K567 Ileus, unspecified: Secondary | ICD-10-CM | POA: Diagnosis present

## 2021-12-23 DIAGNOSIS — G40909 Epilepsy, unspecified, not intractable, without status epilepticus: Secondary | ICD-10-CM | POA: Diagnosis not present

## 2021-12-23 DIAGNOSIS — E871 Hypo-osmolality and hyponatremia: Secondary | ICD-10-CM | POA: Diagnosis not present

## 2021-12-23 DIAGNOSIS — R109 Unspecified abdominal pain: Secondary | ICD-10-CM | POA: Diagnosis not present

## 2021-12-23 DIAGNOSIS — Z880 Allergy status to penicillin: Secondary | ICD-10-CM

## 2021-12-23 DIAGNOSIS — R579 Shock, unspecified: Secondary | ICD-10-CM

## 2021-12-23 DIAGNOSIS — K624 Stenosis of anus and rectum: Secondary | ICD-10-CM | POA: Diagnosis present

## 2021-12-23 DIAGNOSIS — Z9049 Acquired absence of other specified parts of digestive tract: Secondary | ICD-10-CM

## 2021-12-23 DIAGNOSIS — Z888 Allergy status to other drugs, medicaments and biological substances status: Secondary | ICD-10-CM

## 2021-12-23 DIAGNOSIS — Z885 Allergy status to narcotic agent status: Secondary | ICD-10-CM

## 2021-12-23 DIAGNOSIS — A419 Sepsis, unspecified organism: Principal | ICD-10-CM | POA: Diagnosis present

## 2021-12-23 DIAGNOSIS — R55 Syncope and collapse: Secondary | ICD-10-CM | POA: Diagnosis not present

## 2021-12-23 DIAGNOSIS — I10 Essential (primary) hypertension: Secondary | ICD-10-CM | POA: Diagnosis present

## 2021-12-23 DIAGNOSIS — K50919 Crohn's disease, unspecified, with unspecified complications: Secondary | ICD-10-CM | POA: Diagnosis present

## 2021-12-23 DIAGNOSIS — K50011 Crohn's disease of small intestine with rectal bleeding: Secondary | ICD-10-CM

## 2021-12-23 DIAGNOSIS — A0471 Enterocolitis due to Clostridium difficile, recurrent: Secondary | ICD-10-CM | POA: Diagnosis not present

## 2021-12-23 DIAGNOSIS — K50112 Crohn's disease of large intestine with intestinal obstruction: Secondary | ICD-10-CM | POA: Diagnosis not present

## 2021-12-23 DIAGNOSIS — R000 Tachycardia, unspecified: Secondary | ICD-10-CM | POA: Diagnosis not present

## 2021-12-23 DIAGNOSIS — R4182 Altered mental status, unspecified: Secondary | ICD-10-CM | POA: Diagnosis not present

## 2021-12-23 DIAGNOSIS — Z8249 Family history of ischemic heart disease and other diseases of the circulatory system: Secondary | ICD-10-CM

## 2021-12-23 DIAGNOSIS — K50118 Crohn's disease of large intestine with other complication: Secondary | ICD-10-CM | POA: Diagnosis not present

## 2021-12-23 LAB — CBC WITH DIFFERENTIAL/PLATELET
Abs Immature Granulocytes: 0.32 10*3/uL — ABNORMAL HIGH (ref 0.00–0.07)
Basophils Absolute: 0.1 10*3/uL (ref 0.0–0.1)
Basophils Relative: 1 %
Eosinophils Absolute: 0 10*3/uL (ref 0.0–0.5)
Eosinophils Relative: 0 %
HCT: 35.1 % — ABNORMAL LOW (ref 36.0–46.0)
Hemoglobin: 11.1 g/dL — ABNORMAL LOW (ref 12.0–15.0)
Immature Granulocytes: 1 %
Lymphocytes Relative: 7 %
Lymphs Abs: 1.6 10*3/uL (ref 0.7–4.0)
MCH: 32.3 pg (ref 26.0–34.0)
MCHC: 31.6 g/dL (ref 30.0–36.0)
MCV: 102 fL — ABNORMAL HIGH (ref 80.0–100.0)
Monocytes Absolute: 2.1 10*3/uL — ABNORMAL HIGH (ref 0.1–1.0)
Monocytes Relative: 9 %
Neutro Abs: 20.1 10*3/uL — ABNORMAL HIGH (ref 1.7–7.7)
Neutrophils Relative %: 82 %
Platelets: 425 10*3/uL — ABNORMAL HIGH (ref 150–400)
RBC: 3.44 MIL/uL — ABNORMAL LOW (ref 3.87–5.11)
RDW: 13.7 % (ref 11.5–15.5)
WBC: 24.2 10*3/uL — ABNORMAL HIGH (ref 4.0–10.5)
nRBC: 0 % (ref 0.0–0.2)

## 2021-12-23 LAB — URINALYSIS, ROUTINE W REFLEX MICROSCOPIC
Bilirubin Urine: NEGATIVE
Glucose, UA: NEGATIVE mg/dL
Hgb urine dipstick: NEGATIVE
Ketones, ur: NEGATIVE mg/dL
Nitrite: NEGATIVE
Protein, ur: 30 mg/dL — AB
Renal Epithelial: <1
Specific Gravity, Urine: 1.019 (ref 1.005–1.030)
WBC, UA: >50 WBC/hpf — ABNORMAL HIGH (ref 0–5)
pH: 5 (ref 5.0–8.0)

## 2021-12-23 LAB — I-STAT CHEM 8, ED
BUN: 28 mg/dL — ABNORMAL HIGH (ref 6–20)
Calcium, Ion: 0.99 mmol/L — ABNORMAL LOW (ref 1.15–1.40)
Chloride: 106 mmol/L (ref 98–111)
Creatinine, Ser: 1.4 mg/dL — ABNORMAL HIGH (ref 0.44–1.00)
Glucose, Bld: 135 mg/dL — ABNORMAL HIGH (ref 70–99)
HCT: 33 % — ABNORMAL LOW (ref 36.0–46.0)
Hemoglobin: 11.2 g/dL — ABNORMAL LOW (ref 12.0–15.0)
Potassium: 4.2 mmol/L (ref 3.5–5.1)
Sodium: 136 mmol/L (ref 135–145)
TCO2: 19 mmol/L — ABNORMAL LOW (ref 22–32)

## 2021-12-23 LAB — COMPREHENSIVE METABOLIC PANEL
ALT: 12 U/L (ref 0–44)
AST: 21 U/L (ref 15–41)
Albumin: 1.9 g/dL — ABNORMAL LOW (ref 3.5–5.0)
Alkaline Phosphatase: 150 U/L — ABNORMAL HIGH (ref 38–126)
Anion gap: 8 (ref 5–15)
BUN: 32 mg/dL — ABNORMAL HIGH (ref 6–20)
CO2: 20 mmol/L — ABNORMAL LOW (ref 22–32)
Calcium: 7.5 mg/dL — ABNORMAL LOW (ref 8.9–10.3)
Chloride: 109 mmol/L (ref 98–111)
Creatinine, Ser: 1.54 mg/dL — ABNORMAL HIGH (ref 0.44–1.00)
GFR, Estimated: 40 mL/min — ABNORMAL LOW (ref >60)
Glucose, Bld: 134 mg/dL — ABNORMAL HIGH (ref 70–99)
Potassium: 4.2 mmol/L (ref 3.5–5.1)
Sodium: 137 mmol/L (ref 135–145)
Total Bilirubin: 0.6 mg/dL (ref 0.3–1.2)
Total Protein: 4.3 g/dL — ABNORMAL LOW (ref 6.5–8.1)

## 2021-12-23 LAB — RESP PANEL BY RT-PCR (FLU A&B, COVID) ARPGX2
Influenza A by PCR: NEGATIVE
Influenza B by PCR: NEGATIVE
SARS Coronavirus 2 by RT PCR: NEGATIVE

## 2021-12-23 LAB — POC OCCULT BLOOD, ED: Fecal Occult Bld: POSITIVE — AB

## 2021-12-23 LAB — APTT: aPTT: 37 seconds — ABNORMAL HIGH (ref 24–36)

## 2021-12-23 LAB — PREPARE RBC (CROSSMATCH)

## 2021-12-23 LAB — LACTIC ACID, PLASMA
Lactic Acid, Venous: 5.9 mmol/L (ref 0.5–1.9)
Lactic Acid, Venous: 2.8 mmol/L (ref 0.5–1.9)

## 2021-12-23 LAB — PROTIME-INR
INR: 1.2 (ref 0.8–1.2)
Prothrombin Time: 14.9 seconds (ref 11.4–15.2)

## 2021-12-23 MED ORDER — FENTANYL CITRATE PF 50 MCG/ML IJ SOSY
25.0000 ug | PREFILLED_SYRINGE | INTRAMUSCULAR | Status: DC | PRN
  Administered 2021-12-24 – 2021-12-31 (×30): 25 ug via INTRAVENOUS
  Filled 2021-12-23 (×30): qty 1

## 2021-12-23 MED ORDER — SODIUM CHLORIDE 0.9 % IV SOLN
12.5000 mg | Freq: Four times a day (QID) | INTRAVENOUS | Status: DC | PRN
  Administered 2021-12-24 – 2021-12-29 (×3): 12.5 mg via INTRAVENOUS
  Filled 2021-12-23 (×3): qty 0.5

## 2021-12-23 MED ORDER — SODIUM CHLORIDE 0.9 % IV BOLUS
1000.0000 mL | Freq: Once | INTRAVENOUS | Status: AC
  Administered 2021-12-23: 1000 mL via INTRAVENOUS

## 2021-12-23 MED ORDER — METRONIDAZOLE 500 MG/100ML IV SOLN
500.0000 mg | Freq: Once | INTRAVENOUS | Status: AC
Start: 2021-12-24 — End: 2021-12-24
  Administered 2021-12-24: 500 mg via INTRAVENOUS
  Filled 2021-12-23: qty 100

## 2021-12-23 MED ORDER — SODIUM CHLORIDE 0.9 % IV BOLUS
2000.0000 mL | Freq: Once | INTRAVENOUS | Status: AC
  Administered 2021-12-23: 1000 mL via INTRAVENOUS

## 2021-12-23 MED ORDER — SODIUM CHLORIDE 0.9 % IV SOLN
2.0000 g | INTRAVENOUS | Status: DC
  Administered 2021-12-24 – 2021-12-26 (×3): 2 g via INTRAVENOUS
  Filled 2021-12-23 (×3): qty 20

## 2021-12-23 MED ORDER — SODIUM CHLORIDE 0.9 % IV SOLN
2.0000 g | Freq: Once | INTRAVENOUS | Status: AC
  Administered 2021-12-23: 2 g via INTRAVENOUS
  Filled 2021-12-23: qty 12.5

## 2021-12-23 MED ORDER — SODIUM CHLORIDE 0.9 % IV SOLN: 2.0000 g | Freq: Two times a day (BID) | INTRAVENOUS | Status: DC

## 2021-12-23 MED ORDER — LEVETIRACETAM 500 MG PO TABS
750.0000 mg | ORAL_TABLET | Freq: Two times a day (BID) | ORAL | Status: DC
  Administered 2021-12-23 – 2022-01-03 (×22): 750 mg via ORAL
  Filled 2021-12-23 (×22): qty 1

## 2021-12-23 MED ORDER — IOHEXOL 300 MG/ML  SOLN
80.0000 mL | Freq: Once | INTRAMUSCULAR | Status: AC | PRN
  Administered 2021-12-23: 80 mL via INTRAVENOUS

## 2021-12-23 MED ORDER — SODIUM CHLORIDE 0.9 % IV BOLUS: 1000.0000 mL | Freq: Once | INTRAVENOUS | Status: DC

## 2021-12-23 MED ORDER — ACETAMINOPHEN 325 MG PO TABS
650.0000 mg | ORAL_TABLET | Freq: Four times a day (QID) | ORAL | Status: DC | PRN
  Administered 2021-12-24 – 2022-01-03 (×7): 650 mg via ORAL
  Filled 2021-12-23 (×7): qty 2

## 2021-12-23 MED ORDER — SODIUM CHLORIDE 0.9 % IV SOLN: 2.0000 g | Freq: Once | INTRAVENOUS | Status: DC

## 2021-12-23 MED ORDER — SODIUM CHLORIDE 0.9 % IV SOLN: INTRAVENOUS | Status: AC

## 2021-12-23 MED ORDER — ONDANSETRON HCL 4 MG/2ML IJ SOLN
4.0000 mg | Freq: Once | INTRAMUSCULAR | Status: AC
  Administered 2021-12-23: 4 mg via INTRAVENOUS
  Filled 2021-12-23: qty 2

## 2021-12-23 MED ORDER — SODIUM CHLORIDE 0.9 % IV SOLN
10.0000 mL/h | Freq: Once | INTRAVENOUS | Status: AC
  Administered 2021-12-23: 10 mL/h via INTRAVENOUS

## 2021-12-23 MED ORDER — OXYCODONE HCL 5 MG PO TABS: 10.0000 mg | ORAL_TABLET | Freq: Once | ORAL | Status: DC

## 2021-12-23 MED ORDER — POLYETHYLENE GLYCOL 3350 17 G PO PACK
17.0000 g | PACK | Freq: Every day | ORAL | Status: DC | PRN
Start: 2021-12-23 — End: 2022-01-03

## 2021-12-23 MED ORDER — ACETAMINOPHEN 650 MG RE SUPP: 650.0000 mg | Freq: Four times a day (QID) | RECTAL | Status: DC | PRN

## 2021-12-23 MED ORDER — METRONIDAZOLE 500 MG/100ML IV SOLN
500.0000 mg | Freq: Once | INTRAVENOUS | Status: AC
Start: 2021-12-23 — End: 2021-12-23
  Administered 2021-12-23: 500 mg via INTRAVENOUS
  Filled 2021-12-23: qty 100

## 2021-12-23 MED ORDER — NOREPINEPHRINE 4 MG/250ML-% IV SOLN
0.0000 ug/min | INTRAVENOUS | Status: DC
  Administered 2021-12-24: 13 ug/min via INTRAVENOUS
  Administered 2021-12-24: 2 ug/min via INTRAVENOUS
  Administered 2021-12-24: 7 ug/min via INTRAVENOUS
  Administered 2021-12-24: 14 ug/min via INTRAVENOUS
  Administered 2021-12-25: 7 ug/min via INTRAVENOUS
  Filled 2021-12-23 (×5): qty 250

## 2021-12-23 NOTE — ED Notes (Signed)
Consent obtained

## 2021-12-23 NOTE — ED Notes (Signed)
Patient continues to be incontinent of liquid brown stool with blood streaks noted.

## 2021-12-23 NOTE — Assessment & Plan Note (Addendum)
Diffuse abdominal tenderness, continues to have large-volume diarrhea.  History of Crohn's disease, not on treatment.  Reported blood in stools.  Hemoglobin stable. -IV ceftriazone and Metronidazole -IV fluids -Patient requested IV fentanyl only reports allergy to other opiates -Phenergan as needed -Stool C. Difficile -GI pathogen panel -Already received 1 unit PRBC -Trend hemoglobin -With history of Crohn's, ?  Need for steroids -GI consult -IV Protonix 40 daily

## 2021-12-23 NOTE — ED Notes (Signed)
Vomited moderate amount of brown stool appearing emesis while rolled on to side. No signs of aspiration. Provider informed.

## 2021-12-23 NOTE — ED Notes (Signed)
C/o nausea. Rolled to her side. Vomited moderate amount of brown vomit that smells of stool, coffee ground appearance noted.

## 2021-12-23 NOTE — ED Notes (Signed)
Blood transfusion started.

## 2021-12-23 NOTE — Assessment & Plan Note (Signed)
Creatinine 1.58, baseline about 0.6-1.  Likely from dehydration, hypotension, sepsis. -Check CK -Hydrate

## 2021-12-23 NOTE — Assessment & Plan Note (Signed)
Initial hypotension systolic down to 25O.  Likely from no oral intake in the past 2 days, and sepsis. -Not on antihypertensives.

## 2021-12-23 NOTE — Sepsis Progress Note (Signed)
Notified bedside nurse of need to draw and administer antibiotics, lactic acid, blood cultures, and fluid bolus.

## 2021-12-23 NOTE — Assessment & Plan Note (Signed)
Not on medication. - HgbA1c -Monitor CBG daily

## 2021-12-23 NOTE — ED Provider Notes (Signed)
[facility] EMERGENCY DEPARTMENT Provider Note   CSN: [id number] Arrival date & time: 12/23/21  1416     History  Chief Complaint  Patient presents with   Hypotension   Tachycardia   Rectal Bleeding    [name] is a 51 y.o. female.  Patient brought in from home after she had numerous episodes of rectal bleeding.  When the paramedics arrived they could not get a blood pressure on her they put an IO in the right leg he gave her some fluids and when she arrived to emergency department she had a systolic blood pressure about 70 and with mildly lethargic  The history is provided by the EMS personnel. No language interpreter was used.  Rectal Bleeding Quality:  Maroon Amount:  Moderate Timing:  Constant Chronicity:  New Context: not anal fissures   Similar prior episodes: no   Relieved by:  Nothing Worsened by:  Nothing Associated symptoms: abdominal pain   Risk factors: no anticoagulant use        Home Medications Prior to Admission medications   Medication Sig Start Date End Date Taking? Authorizing Provider  albuterol (VENTOLIN HFA) 108 (90 Base) MCG/ACT inhaler Inhale 2 puffs into the lungs every 4 (four) hours as needed for wheezing or shortness of breath. 01/24/21   Roxan Hockey, MD  ALPRAZolam [name]) 1 MG tablet Take 1 mg by mouth 3 (three) times daily as needed for anxiety. 07/11/19   [provider]  amitriptyline (ELAVIL) 75 MG tablet Take 100 mg by mouth at bedtime. 12/16/20   [provider]  cefdinir (OMNICEF) 300 MG capsule Take 1 capsule (300 mg total) by mouth every 12 (twelve) hours. 10/03/21   Orson Eva, MD  cyclobenzaprine (FLEXERIL) 10 MG tablet Take 10 mg by mouth 3 (three) times daily as needed for muscle spasms. 07/11/19   [provider]  folic acid (FOLVITE) 1 MG tablet Take 1 tablet (1 mg total) by mouth daily. 10/03/21   Orson Eva, MD  hydrOXYzine (ATARAX) 10 MG tablet Take 1 tablet (10 mg total) by mouth at  bedtime. Patient taking differently: Take 50 mg by mouth every 8 (eight) hours as needed for anxiety. 09/07/21   Harvel Quale, MD  levETIRAcetam (KEPPRA) 750 MG tablet Take 750 mg by mouth 2 (two) times daily. 09/30/20   [provider]  nitroGLYCERIN (NITROSTAT) 0.4 MG SL tablet Place 1 tablet (0.4 mg total) under the tongue every 5 (five) minutes as needed for chest pain. 03/19/21   Rogene Houston, MD  omeprazole (PRILOSEC) 40 MG capsule Take 40 mg by mouth daily. 07/21/19   [provider]  ondansetron (ZOFRAN-ODT) 4 MG disintegrating tablet Take 4 mg by mouth every 8 (eight) hours as needed for nausea or vomiting. 09/07/21   [provider]  RESTASIS 0.05 % ophthalmic emulsion Place 1 drop into both eyes 2 (two) times daily. 12/12/20   [provider]  SPIRIVA RESPIMAT 2.5 MCG/ACT AERS Inhale 1 puff into the lungs daily as needed (shortness of breath). Patient taking differently: Inhale 1 puff into the lungs daily. 01/24/21   Roxan Hockey, MD  triamcinolone cream (KENALOG) 0.1 % Apply topically 2 (two) times daily. 10/03/21   Orson Eva, MD      Allergies    Codeine, Dilaudid [hydromorphone hcl], Hydrocodone, Morphine and related, Oxycodone, Penicillins, Acetaminophen, Ativan [lorazepam], Strawberry extract, Watermelon concentrate, Albuterol, Benadryl [diphenhydramine hcl], Latex, and Remicade [infliximab]    Review of Systems   Review of Systems  Constitutional:  Negative for appetite change and fatigue.  HENT:  Negative for congestion, ear discharge and sinus pressure.   Eyes:  Negative for discharge.  Respiratory:  Negative for cough.   Cardiovascular:  Negative for chest pain.  Gastrointestinal:  Positive for abdominal pain and hematochezia. Negative for diarrhea.       Rectal bleeding  Genitourinary:  Negative for frequency and hematuria.  Musculoskeletal:  Negative for back pain.  Skin:  Negative for rash.  Neurological:  Negative for  seizures and headaches.  Psychiatric/Behavioral:  Negative for hallucinations.     Physical Exam Updated Vital Signs Ht 5' 6"  (1.676 m)   Wt 52.6 kg   BMI 18.72 kg/m  Physical Exam Vitals reviewed.  Constitutional:      Appearance: She is well-developed.     Comments: Lethargic  HENT:     Head: Normocephalic.     Nose: Nose normal.  Eyes:     General: No scleral icterus.    Conjunctiva/sclera: Conjunctivae normal.  Neck:     Thyroid: No thyromegaly.  Cardiovascular:     Rate and Rhythm: Normal rate and regular rhythm.     Heart sounds: No murmur heard.    No friction rub. No gallop.  Pulmonary:     Breath sounds: No stridor. No wheezing or rales.  Chest:     Chest wall: No tenderness.  Abdominal:     General: There is no distension.     Tenderness: There is abdominal tenderness. There is no rebound.  Genitourinary:    Comments: Brown stool heme positive Musculoskeletal:        General: Normal range of motion.     Cervical back: Neck supple.  Lymphadenopathy:     Cervical: No cervical adenopathy.  Skin:    Findings: No erythema or rash.  Neurological:     Motor: No abnormal muscle tone.     Coordination: Coordination normal.     Comments: Patient oriented to person and place  Psychiatric:        Behavior: Behavior normal.     ED Results / Procedures / Treatments   Labs (all labs ordered are listed, but only abnormal results are displayed) Labs Reviewed  I-STAT CHEM 8, ED - Abnormal; Notable for the following components:      Result Value   BUN 28 (*)    Creatinine, Ser 1.40 (*)    Glucose, Bld 135 (*)    Calcium, Ion 0.99 (*)    TCO2 19 (*)    Hemoglobin 11.2 (*)    HCT 33.0 (*)    All other components within normal limits  RESP PANEL BY RT-PCR (FLU A&B, COVID) ARPGX2  CULTURE, BLOOD (ROUTINE X 2)  CULTURE, BLOOD (ROUTINE X 2)  URINE CULTURE  CBC WITH DIFFERENTIAL/PLATELET  COMPREHENSIVE METABOLIC PANEL  LACTIC ACID, PLASMA  LACTIC ACID, PLASMA   PROTIME-INR  APTT  URINALYSIS, ROUTINE W REFLEX MICROSCOPIC  POC OCCULT BLOOD, ED  POC URINE PREG, ED  TYPE AND SCREEN  PREPARE RBC (CROSSMATCH)    EKG None  Radiology DG Abdomen 1 View  Result Date: 12/23/2021 CLINICAL DATA:  Central line placement EXAM: ABDOMEN - 1 VIEW COMPARISON:  None Available. FINDINGS: Right femoral central line is in place with the tip in the expected location of the common iliac vein. Bowel gas pattern is unremarkable. IMPRESSION: Central line on the right at the expected location of the common iliac vein. Electronically Signed   By: Jan Fireman.D.  On: 12/23/2021 15:36    Procedures .Central Line  Date/Time: 12/25/2021 7:33 AM  Performed by: Milton Ferguson, MD Authorized by: Milton Ferguson, MD   Consent:    Consent obtained:  Emergent situation   Consent given by:  Patient   Risks, benefits, and alternatives were discussed: yes     Risks discussed:  Arterial puncture Universal protocol:    Procedure explained and questions answered to patient or proxy's satisfaction: yes     Relevant documents present and verified: yes     Test results available: no     Imaging studies available: no     Required blood products, implants, devices, and special equipment available: yes     Site/side marked: no     Immediately prior to procedure, a time out was called: yes     Patient identity confirmed:  Verbally with patient and arm band Pre-procedure details:    Indication(s): central venous access     Hand hygiene: Hand hygiene performed prior to insertion     Sterile barrier technique: All elements of maximal sterile technique followed     Skin preparation:  Povidone-iodine   Skin preparation agent: Skin preparation agent completely dried prior to procedure   Anesthesia:    Anesthesia method:  None Procedure details:    Location:  R femoral   Patient position:  Supine   Procedural supplies:  Triple lumen   Landmarks identified: yes     Ultrasound  guidance: no     Number of attempts:  5 or more   Successful placement: yes   Post-procedure details:    Post-procedure:  Dressing applied   Assessment:  Blood return through all ports   Procedure completion:  Tolerated well, no immediate complications   Complications:  None     Medications Ordered in ED Medications  sodium chloride 0.9 % bolus 2,000 mL (has no administration in time range)  0.9 %  sodium chloride infusion (has no administration in time range)  ondansetron (ZOFRAN) injection 4 mg (has no administration in time range)  metroNIDAZOLE (FLAGYL) IVPB 500 mg (has no administration in time range)  ceFEPIme (MAXIPIME) 2 g in sodium chloride 0.9 % 100 mL IVPB (has no administration in time range)  ceFEPIme (MAXIPIME) 2 g in sodium chloride 0.9 % 100 mL IVPB (has no administration in time range)    ED Course/ Medical Decision Making/ A&P  CRITICAL CARE Performed by: Milton Ferguson Total critical care time: 40 minutes Critical care time was exclusive of separately billable procedures and treating other patients. Critical care was necessary to treat or prevent imminent or life-threatening deterioration. Critical care was time spent personally by me on the following activities: development of treatment plan with patient and/or surrogate as well as nursing, discussions with consultants, evaluation of patient's response to treatment, examination of patient, obtaining history from patient or surrogate, ordering and performing treatments and interventions, ordering and review of laboratory studies, ordering and review of radiographic studies, pulse oximetry and re-evaluation of patient's condition.   Patient has a central line placed in his right groin without problems                         Medical Decision Making Amount and/or Complexity of Data Reviewed Labs: ordered. Radiology: ordered. ECG/medicine tests: ordered.  Risk Prescription drug management. Decision regarding  hospitalization.  This patient presents to the ED for concern of rectal bleeding  this involves an extensive number of treatment options,  and is a complaint that carries with it a high risk of complications and morbidity.  The differential diagnosis includes sepsis and rectal bleeding   Co morbidities that complicate the patient evaluation  Unknown   Additional history obtained:  Additional history obtained from paramedics External records from outside source obtained and reviewed including hospital records   Lab Tests:  I Ordered, and personally interpreted labs.  The pertinent results include: White blood count 24.2, heme positive stool   Imaging Studies ordered:  I ordered imaging studies including CT abdomen shows I independently visualized and interpreted imaging which showed proctocolitis I agree with the radiologist interpretation   Cardiac Monitoring: / EKG:  The patient was maintained on a cardiac monitor.  I personally viewed and interpreted the cardiac monitored which showed an underlying rhythm of: Normal sinus rhythm   Consultations Obtained:  I requested consultation with the hospitalist,  and discussed lab and imaging findings as well as pertinent plan - they recommend: Admit sepsis and rectal bleeding   Problem List / ED Course / Critical interventions / Medication management  Sepsis and rectal bleeding I ordered medication including antibiotics for sepsis Reevaluation of the patient after these medicines showed that the patient improved I have reviewed the patients home medicines and have made adjustments as needed   Social Determinants of Health:  None   Test / Admission - Considered:  None  Patient with significant rectal bleeding hypotensive and hypothermic.  She has been resuscitated with IV fluids and antibiotics for possible sepsis.  She will possibly get packed red blood cells for the rectal bleeding.  Disposition will be determined by my  colleague Dr. Pearline Cables.        Final Clinical Impression(s) / ED Diagnoses Final diagnoses:  None    Rx / DC Orders ED Discharge Orders     None         Milton Ferguson, MD 12/25/21 [phone]

## 2021-12-23 NOTE — H&P (Signed)
History and Physical    [name] WFU:[id number] DOB: 19-Sep-1970 DOA: 12/23/2021  PCP: Teodoro Kil, PA-C   Patient coming from: Home  I have personally briefly reviewed patient's old medical records in [place]  Chief Complaint: Found down and unresponsive  HPI: [name] is a 51 y.o. female with medical history significant for Crohn's disease, hypertension, diabetes, seizures. Patient was found unresponsive at home.  Neighbor went to check on her as she had not been seen for 3 days.  Patient was found on floor unresponsive, in the bathroom with bloody stool noted from bedroom to bathroom.  EMS unable to get blood pressure, and peripheral pulses were nonpalpable. EMS established IO access, gave 150 mils of saline, BP improved to 80s patient was lethargic, more alert by the time she came to the ED. At the time of my evaluation, patient is awake alert oriented x3.  She thinks she passed out 2 days ago on Monday morning.  She tells me the last thing she remembers is getting out of the bathtub, walking on the bathroom and falling, she tried to get up have not seen to use a support to get up.  She says she kept falling multiple times that she tried to get up.  She is unaware of having bloody stools prior to passing out.  Does remember having any dizziness.  She denies chest pain or difficulty breathing. She reports chronic diarrhea, up to 10 times a day.  Reports intermittent vomiting-Chronic.  ED Course: Initial hypotension, systolic down to 67, due to reports of large amount of blood in stool, and initial hypotension, 2 units PRBC was ordered,.  Hgb returned stable at 11.1, order for blood transfusion was canceled, but 1 unit of blood was already hanging.   Temperature down to 93.6.  Heart rate 90-106.  Respiratory rate 16-24. Leukocytosis of 24.2. Cr elevated 1.54 Lactic acidosis 5.9 > 2.8. UA with moderate leukocytes many bacteria.   CT abdomen- Mild proctocolitis involving  the rectosigmoid, without significant change. No evidence of abscess or other complication. IV metronidazole and cefepime given. Central line placed.  Levophed was ordered but this was not started as patient's blood pressure improved after 3 L bolus of fluids. Hospitalist to admit for sepsis secondary to proctocolitis, likely UTI.  Review of Systems: As per HPI all other systems reviewed and negative.  Past Medical History:  Diagnosis Date   Anxiety    Asthma    Chronic low back pain    Collagen vascular disease (HCC)    COPD (chronic obstructive pulmonary disease) (HCC)    Crohn's disease (HCC)    GERD (gastroesophageal reflux disease)    EGD 01/2007 by Dr.Rourke small hiatal hernia s/p 56 french maloney    History of head injury    Hypertension    IBS (irritable bowel syndrome)    PSVT (paroxysmal supraventricular tachycardia) (HCC)    PTSD (post-traumatic stress disorder)    Recurrent chest pain    Seizure disorder ([place])    Type 2 diabetes mellitus ([place])     Past Surgical History:  Procedure Laterality Date   ABDOMINAL HYSTERECTOMY     with right salpingo oophorectomy 2005   APPENDECTOMY  2006   BALLOON DILATION N/A 02/19/2021   Procedure: BALLOON DILATION;  Surgeon: Eloise Harman, DO;  [facility];  Service: Endoscopy;  Laterality: N/A;  Sigmoid colon stricture   BIOPSY  02/06/2021   Procedure: BIOPSY;  Surgeon: Eloise Harman, DO;  [facility];  Service: Endoscopy;;   BIOPSY  02/19/2021   Procedure: BIOPSY;  Surgeon: Eloise Harman, DO;  [facility];  Service: Endoscopy;;   BIOPSY  07/15/2021   Procedure: BIOPSY;  Surgeon: Rogene Houston, MD;  [facility];  Service: Endoscopy;;   CESAREAN SECTION     1996   CHOLECYSTECTOMY     COLONOSCOPY  2010   Dr. Gala Romney; negative except for hemorrhoids   ESOPHAGEAL DILATION N/A 10/25/2014   Procedure: ESOPHAGEAL DILATION;  Surgeon: Rogene Houston, MD;  [facility];  Service: Endoscopy;  Laterality: N/A;   ESOPHAGOGASTRODUODENOSCOPY N/A 10/25/2014   Procedure: ESOPHAGOGASTRODUODENOSCOPY (EGD);  Surgeon: Rogene Houston, MD;  [facility];  Service: Endoscopy;  Laterality: N/A;  [place] N/A 02/06/2021   Procedure: FLEXIBLE SIGMOIDOSCOPY;  Surgeon: Eloise Harman, DO;  [facility];  Service: Endoscopy;  Laterality: N/A;   FLEXIBLE SIGMOIDOSCOPY N/A 02/19/2021   Procedure: FLEXIBLE SIGMOIDOSCOPY;  Surgeon: Eloise Harman, DO;  [facility];  Service: Endoscopy;  Laterality: N/A;   OOPHORECTOMY     left for torsion and ovarian fibroma; uterine myoma resected 1995   SIGMOIDOSCOPY  07/15/2021   Procedure: SIGMOIDOSCOPY;  Surgeon: Rogene Houston, MD;  [facility];  Service: Endoscopy;;     reports that she has been smoking cigarettes. She started smoking about 37 years ago. She has a 10.00 pack-year smoking history. She has been exposed to tobacco smoke. She has never used smokeless tobacco. She reports that she does not currently use drugs. She reports that she does not drink alcohol.  Allergies  Allergen Reactions   Codeine Shortness Of Breath, Swelling and Rash    Throat swelling   Dilaudid [Hydromorphone Hcl] Shortness Of Breath and Swelling   Hydrocodone Shortness Of Breath, Swelling and Rash   Morphine And Related Shortness Of Breath, Swelling and Rash   Oxycodone Shortness Of Breath, Swelling and Rash    Patient states ALL pain medications make her deathly sick.   Penicillins Shortness Of Breath, Swelling and Other (See Comments)    Has patient had a PCN reaction causing immediate rash, facial/tongue/throat swelling, SOB or lightheadedness with hypotension: Yes Has patient had a PCN reaction causing severe rash involving mucus membranes or skin necrosis: Yes Has patient had a PCN reaction that required hospitalization Yes Has patient had a PCN reaction occurring within  the last 10 years: No If all of the above answers are "NO", then may proceed with Cephalosporin use.   Throat swells  02/06/21--TOLERATES CEFTRIAXONE     Acetaminophen     Per MD patient states she can't take Tylenol because of liver enzymes   Ativan [Lorazepam] Other (See Comments)    Migraines.   Strawberry Extract Swelling   Watermelon Concentrate Swelling   Albuterol Palpitations   Benadryl [Diphenhydramine Hcl] Palpitations   Latex Rash   Remicade [Infliximab] Rash    Blisters and Welts     Family History  Problem Relation Age of Onset   Cancer Mother    Heart failure Mother    Hypertension Mother    Heart attack Mother    Heart attack Father 60   Cancer Father 40   Heart failure Father    Diabetes Father    Crohn's disease Cousin    Prior to Admission medications   Medication Sig Start Date End Date Taking? Authorizing Provider  albuterol (VENTOLIN HFA)  108 (90 Base) MCG/ACT inhaler Inhale 2 puffs into the lungs every 4 (four) hours as needed for wheezing or shortness of breath. 01/24/21   Roxan Hockey, MD  ALPRAZolam [name]) 1 MG tablet Take 1 mg by mouth 3 (three) times daily as needed for anxiety. 07/11/19   [provider]  amitriptyline (ELAVIL) 75 MG tablet Take 100 mg by mouth at bedtime. 12/16/20   [provider]  cefdinir (OMNICEF) 300 MG capsule Take 1 capsule (300 mg total) by mouth every 12 (twelve) hours. 10/03/21   Orson Eva, MD  cyclobenzaprine (FLEXERIL) 10 MG tablet Take 10 mg by mouth 3 (three) times daily as needed for muscle spasms. 07/11/19   [provider]  folic acid (FOLVITE) 1 MG tablet Take 1 tablet (1 mg total) by mouth daily. 10/03/21   Orson Eva, MD  hydrOXYzine (ATARAX) 10 MG tablet Take 1 tablet (10 mg total) by mouth at bedtime. Patient taking differently: Take 50 mg by mouth every 8 (eight) hours as needed for anxiety. 09/07/21   Harvel Quale, MD  levETIRAcetam (KEPPRA) 750 MG tablet Take 750 mg by  mouth 2 (two) times daily. 09/30/20   [provider]  nitroGLYCERIN (NITROSTAT) 0.4 MG SL tablet Place 1 tablet (0.4 mg total) under the tongue every 5 (five) minutes as needed for chest pain. 03/19/21   Rogene Houston, MD  omeprazole (PRILOSEC) 40 MG capsule Take 40 mg by mouth daily. 07/21/19   [provider]  ondansetron (ZOFRAN-ODT) 4 MG disintegrating tablet Take 4 mg by mouth every 8 (eight) hours as needed for nausea or vomiting. 09/07/21   [provider]  RESTASIS 0.05 % ophthalmic emulsion Place 1 drop into both eyes 2 (two) times daily. 12/12/20   [provider]  SPIRIVA RESPIMAT 2.5 MCG/ACT AERS Inhale 1 puff into the lungs daily as needed (shortness of breath). Patient taking differently: Inhale 1 puff into the lungs daily. 01/24/21   Roxan Hockey, MD  triamcinolone cream (KENALOG) 0.1 % Apply topically 2 (two) times daily. 10/03/21   Orson Eva, MD    Physical Exam: Vitals:   12/23/21 1930 12/23/21 1952 12/23/21 2003 12/23/21 2005  BP: (!) 100/54 (!) 100/54 (!) 97/50   Pulse: 100 100 (!) 106 (!) 106  Resp: 20 (!) 22 (!) 24 (!) 23  Temp: (!) 97.5 F (36.4 C) 98.1 F (36.7 C) 98.2 F (36.8 C) 98.2 F (36.8 C)  TempSrc:  Bladder    SpO2: 100%  99% 99%  Weight:      Height:        Constitutional: NAD, calm, comfortable Vitals:   12/23/21 1930 12/23/21 1952 12/23/21 2003 12/23/21 2005  BP: (!) 100/54 (!) 100/54 (!) 97/50   Pulse: 100 100 (!) 106 (!) 106  Resp: 20 (!) 22 (!) 24 (!) 23  Temp: (!) 97.5 F (36.4 C) 98.1 F (36.7 C) 98.2 F (36.8 C) 98.2 F (36.8 C)  TempSrc:  Bladder    SpO2: 100%  99% 99%  Weight:      Height:       Eyes: PERRL, lids and conjunctivae normal ENMT: Mucous membranes are very dry.   Neck: normal, supple, no masses, no thyromegaly Respiratory: clear to auscultation bilaterally, no wheezing, no crackles. Normal respiratory effort. No accessory muscle use.  Cardiovascular: Tachycardic, regular rate  and rhythm, no murmurs / rubs / gallops. No extremity edema.  Extremities warm. Abdomen: Diffuse abdominal tenderness, soft, no masses palpated. No hepatosplenomegaly.  Rectal tube in place, liquid brown stool around tube. Musculoskeletal: no clubbing / cyanosis. No joint deformity upper and lower extremities.  Skin: no rashes, lesions, ulcers. No induration Neurologic: No apparent cranial nerve abnormality, 4+ /5 strength in all extremities .  Psychiatric: Normal judgment and insight. Alert and oriented x 3. Normal mood.   Labs on Admission: I have personally reviewed following labs and imaging studies  CBC: Recent Labs  Lab 12/23/21 1524 12/23/21 1533  WBC 24.2*  --   NEUTROABS 20.1*  --   HGB 11.1* 11.2*  HCT 35.1* 33.0*  MCV 102.0*  --   PLT 425*  --    Basic Metabolic Panel: Recent Labs  Lab 12/23/21 1524 12/23/21 1533  NA 137 136  K 4.2 4.2  CL 109 106  CO2 20*  --   GLUCOSE 134* 135*  BUN 32* 28*  CREATININE 1.54* 1.40*  CALCIUM 7.5*  --    GFR: Estimated Creatinine Clearance: 39 mL/min (A) (by C-G formula based on SCr of 1.4 mg/dL (H)). Liver Function Tests: Recent Labs  Lab 12/23/21 1524  AST 21  ALT 12  ALKPHOS 150*  BILITOT 0.6  PROT 4.3*  ALBUMIN 1.9*   Coagulation Profile: Recent Labs  Lab 12/23/21 1527  INR 1.2   Urine analysis:    Component Value Date/Time   COLORURINE AMBER (A) 12/23/2021 1641   APPEARANCEUR HAZY (A) 12/23/2021 1641   LABSPEC 1.019 12/23/2021 1641   PHURINE 5.0 12/23/2021 1641   GLUCOSEU NEGATIVE 12/23/2021 1641   GLUCOSEU NEG mg/dL 12/24/2009 2317   HGBUR NEGATIVE 12/23/2021 1641   BILIRUBINUR NEGATIVE 12/23/2021 1641   KETONESUR NEGATIVE 12/23/2021 1641   PROTEINUR 30 (A) 12/23/2021 1641   UROBILINOGEN 0.2 12/26/2013 1530   NITRITE NEGATIVE 12/23/2021 1641   LEUKOCYTESUR MODERATE (A) 12/23/2021 1641    Radiological Exams on Admission: CT ABDOMEN PELVIS W CONTRAST  Result Date: 12/23/2021 CLINICAL DATA:   Abdominal pain and bloating for 1 week. Coffee-ground emesis. EXAM: CT ABDOMEN AND PELVIS WITH CONTRAST TECHNIQUE: Multidetector CT imaging of the abdomen and pelvis was performed using the standard protocol following bolus administration of intravenous contrast. RADIATION DOSE REDUCTION: This exam was performed according to the departmental dose-optimization program which includes automated exposure control, adjustment of the mA and/or kV according to patient size and/or use of iterative reconstruction technique. CONTRAST:  77m OMNIPAQUE IOHEXOL 300 MG/ML  SOLN COMPARISON:  10/24/2021 FINDINGS: Lower Chest: Stable small pericardial effusion. Hepatobiliary: No hepatic masses identified. Prior cholecystectomy. No evidence of biliary obstruction. Pancreas:  No mass or inflammatory changes. Spleen: Within normal limits in size and appearance. Adrenals/Urinary Tract: No masses identified. No evidence of ureteral calculi or hydronephrosis. Foley catheter is seen within the bladder. Stomach/Bowel: Air-fluid levels are seen throughout the colon which is mildly distended, consistent with ileus. Mild wall thickening is again seen involving the rectum and distal sigmoid colon with mild perirectal inflammatory changes, consistent with proctocolitis. No evidence of abscess or bowel obstruction. Vascular/Lymphatic: No pathologically enlarged lymph nodes. No acute vascular findings. Right femoral central venous catheter in appropriate position. Reproductive: Prior hysterectomy noted. Adnexal regions are unremarkable in appearance. Other: Soft tissue gas noted in the right inguinal region and abductor muscles of the thigh, likely related to the right femoral venous catheter placement. Musculoskeletal:  No suspicious bone lesions identified. IMPRESSION: Mild proctocolitis involving the rectosigmoid, without significant change. No evidence of abscess or other complication. Mild colonic ileus. Stable small pericardial effusion.  Electronically Signed   By:  Marlaine Hind M.D.   On: 12/23/2021 19:08   DG Chest Port 1 View  Result Date: 12/23/2021 CLINICAL DATA:  Found unresponsive. EXAM: PORTABLE CHEST 1 VIEW COMPARISON:  March 20, 2021 FINDINGS: The heart size and mediastinal contours are within normal limits. Both lungs are clear. The visualized skeletal structures are unremarkable. IMPRESSION: No active disease. Electronically Signed   By: Virgina Norfolk M.D.   On: 12/23/2021 17:18   DG Abdomen 1 View  Result Date: 12/23/2021 CLINICAL DATA:  Central line placement EXAM: ABDOMEN - 1 VIEW COMPARISON:  None Available. FINDINGS: Right femoral central line is in place with the tip in the expected location of the common iliac vein. Bowel gas pattern is unremarkable. IMPRESSION: Central line on the right at the expected location of the common iliac vein. Electronically Signed   By: Nelson Chimes M.D.   On: 12/23/2021 15:36    EKG: Independently reviewed.  Artifacts present, on initial and repeat EKG.  Sinus tachycardia, rate rate is 101, QTc 425.  Assessment/Plan Principal Problem:   Severe sepsis (HCC) Active Problems:   COPD (chronic obstructive pulmonary disease) (HCC)   Crohn's disease (HCC)   AKI (acute kidney injury) ([place])   Lactic acidosis   Acute metabolic encephalopathy   Essential hypertension   MDD (major depressive disorder)   Enterocolitis   UTI (urinary tract infection)   Assessment and Plan: * Severe sepsis ([place]) Meeting criteria for severe sepsis with hypothermia temperature down to 93.6.  Tachycardia heart rate 90-106, with leukocytosis of 24.2.  And evidence of endorgan dysfunction, AKI, altered mental status, lactic acidosis 5.9 > 2.8.  Source of infection likely enterocolitis as seen on CT, and UTI. -Blood pressure improved after 3 L bolus given. -Has right femoral central line, Levophed was ordered but so far not needed -Continue with IV ceftriaxone and metronidazole -Clear liquid diet,  bowel rest -Follow-up blood and urine cultures  Enterocolitis Diffuse abdominal tenderness, continues to have large-volume diarrhea.  History of Crohn's disease, not on treatment.  Reported blood in stools.  Hemoglobin stable. -IV ceftriazone and Metronidazole -IV fluids -Patient requested IV fentanyl only reports allergy to other opiates -Phenergan as needed -Stool C. Difficile -GI pathogen panel -Already received 1 unit PRBC -Trend hemoglobin -With history of Crohn's, ?  Need for steroids -GI consult -IV Protonix 40 daily   Acute metabolic encephalopathy Found down and unresponsive.  Mental status has improved in the ED.  Was likely down for 2 days. -Obtain head CT -Hydrate  UTI (urinary tract infection) UA suggestive of UTI with moderate leukocytes many bacteria. -IV ceftriaxone -Follow-up urine cultures  AKI (acute kidney injury) (HCC) Creatinine 1.58, baseline about 0.6-1.  Likely from dehydration, hypotension, sepsis. -Check CK -Hydrate  Seizure ([place]) Resume Keppra  Essential hypertension Initial hypotension systolic down to 78G.  Likely from no oral intake in the past 2 days, and sepsis. -Not on antihypertensives.  Diabetes mellitus without complication (HCC) Not on medication. - HgbA1c -Monitor CBG daily   DVT prophylaxis: SCDs Code Status: Full Code Family Communication: Non at bedside Disposition Plan:  > 2 days Consults called: May need GI eval. Admission status: Inpt step down I certify that at the point of admission it is my clinical judgment that the patient will require inpatient hospital care spanning beyond 2 midnights from the point of admission due to high intensity of service, high risk for further deterioration and high frequency of surveillance required.   Author: Bethena Roys, MD 12/23/2021 10:23  PM  For on call review [email or web address].

## 2021-12-23 NOTE — Assessment & Plan Note (Addendum)
-  Found down and unresponsive.   -CT head demonstrated no acute intracranial normalities -After treating underlying sepsis mentation back to baseline and patient safe to discharge home.

## 2021-12-23 NOTE — Sepsis Progress Note (Signed)
Code Sepsis protocol being monitored by eLink. 

## 2021-12-23 NOTE — Progress Notes (Signed)
Pharmacy Antibiotic Note  [name] is a 51 y.o. female admitted on 12/23/2021 with  intra-abdominal infection .  Pharmacy has been consulted for cefepime dosing. Patient has tolerated cephalosporins.  Plan: Cefepime 2000 mg IV every 12 hours. Flagyl 500 mg IV in ED. F/U additional orders. Monitor labs, c/s, and patient improvement.   Height: 5' 6"  (167.6 cm) Weight: 52.6 kg (116 lb) IBW/kg (Calculated) : 59.3  No data recorded.  Recent Labs  Lab 12/23/21 1533  CREATININE 1.40*    Estimated Creatinine Clearance: 39 mL/min (A) (by C-G formula based on SCr of 1.4 mg/dL (H)).    Allergies  Allergen Reactions   Codeine Shortness Of Breath, Swelling and Rash    Throat swelling   Dilaudid [Hydromorphone Hcl] Shortness Of Breath and Swelling   Hydrocodone Shortness Of Breath, Swelling and Rash   Morphine And Related Shortness Of Breath, Swelling and Rash   Oxycodone Shortness Of Breath, Swelling and Rash    Patient states ALL pain medications make her deathly sick.   Penicillins Shortness Of Breath, Swelling and Other (See Comments)    Has patient had a PCN reaction causing immediate rash, facial/tongue/throat swelling, SOB or lightheadedness with hypotension: Yes Has patient had a PCN reaction causing severe rash involving mucus membranes or skin necrosis: Yes Has patient had a PCN reaction that required hospitalization Yes Has patient had a PCN reaction occurring within the last 10 years: No If all of the above answers are "NO", then may proceed with Cephalosporin use.   Throat swells  02/06/21--TOLERATES CEFTRIAXONE     Acetaminophen     Per MD patient states she can't take Tylenol because of liver enzymes   Ativan [Lorazepam] Other (See Comments)    Migraines.   Strawberry Extract Swelling   Watermelon Concentrate Swelling   Albuterol Palpitations   Benadryl [Diphenhydramine Hcl] Palpitations   Latex Rash   Remicade [Infliximab] Rash    Blisters and Welts      Antimicrobials this admission: Cefepime 8/23 >> Flagyl 8/23  Microbiology results: 8/23 BCx: pending 8/23 UCx: pending    Thank you for allowing pharmacy to be a part of this patient's care.  Margot Ables, PharmD Clinical Pharmacist 12/23/2021 3:37 PM

## 2021-12-23 NOTE — ED Notes (Signed)
Contiues to have loose stool. Flexiseal placed to prevent stool from central line site. Dried stool noted to right side of face, torso, legs and feet. Patient washed and dried , clean gown and linen applied. Bare hugger in place.

## 2021-12-23 NOTE — ED Provider Notes (Signed)
3:54 PM Patient signed out to me by previous ED physician. Patient is a 51 yo female found on the ground of her home soiled in feces that is blood-tinged with reported blood on the floor.  Was hypotensive on arrival of the EMS.  Patient was hypotensive on arrival to emergency department.  3 L IV fluids.  2 units packed red blood cells ordered for concerns for possible GI bleed with hemorrhage.  Hemoglobin stable at 7.1.  Blood transfusion canceled. Patient does have leukocytosis of 24.2.  Hypotension is likely secondary to septic shock.  Patient is hypotensive despite 3 L IV fluids.  Will order Levophed in case patient's arterial pressure dropped below 65.  Central line placed by venous provider.  Spectrum antibiotics given.  CT scan of the abdomen demonstrates proctocolitis.  Patient actively having multiple rounds of soft brown stool.  It is heme positive.  Requested for admission for septic shock secondary to proctocolitis with a lactic acid of 5.9 downtrending to 2.8.  Patient admitted to ICU team.  Physical Exam  Ht 5' 6"  (1.676 m)   Wt 52.6 kg   BMI 18.72 kg/m    Procedures  .Critical Care  Performed by: Lianne Cure, DO Authorized by: Lianne Cure, DO   Critical care provider statement:    Critical care time (minutes):  75   Critical care was necessary to treat or prevent imminent or life-threatening deterioration of the following conditions:  Shock and sepsis   Critical care was time spent personally by me on the following activities:  Development of treatment plan with patient or surrogate, discussions with consultants, evaluation of patient's response to treatment, examination of patient, ordering and review of laboratory studies, ordering and review of radiographic studies, ordering and performing treatments and interventions, pulse oximetry, re-evaluation of patient's condition and review of old charts   Care discussed with: admitting provider     ED Course / MDM     Medical Decision Making Amount and/or Complexity of Data Reviewed Labs: ordered. Radiology: ordered. ECG/medicine tests: ordered.  Risk Prescription drug management.          Lianne Cure, DO 16/10/96 2041

## 2021-12-23 NOTE — Assessment & Plan Note (Addendum)
Meeting criteria for severe sepsis with hypothermia temperature down to 93.6.  Tachycardia heart rate 90-106, with leukocytosis of 24.2.  And evidence of endorgan dysfunction, AKI, altered mental status, lactic acidosis 5.9 > 2.8.  Source of infection likely enterocolitis as seen on CT, and UTI. -Blood pressure improved after 3 L bolus given. -Has right femoral central line, Levophed was ordered but so far not needed -Continue with IV ceftriaxone and metronidazole -Clear liquid diet, bowel rest -Follow-up blood and urine cultures

## 2021-12-23 NOTE — ED Notes (Signed)
Blanket warmer removed

## 2021-12-23 NOTE — Assessment & Plan Note (Signed)
UA suggestive of UTI with moderate leukocytes many bacteria. -IV ceftriaxone -Follow-up urine cultures

## 2021-12-23 NOTE — Assessment & Plan Note (Signed)
Resume Keppra

## 2021-12-23 NOTE — ED Triage Notes (Signed)
"  Neighbor called for well being check, nobody has seen her for 3 days. Found in bathroom with bloody stool noted from bedroom to bathroom, patient was laying in the floor on a large pile of bloody stool unresponsive. Peripheral pulses were not palpable, IO established 16m of saline given, BP at 80 systolic, patient became more alert" per EMS On arrival patient is lethargic, able to answer questions, large bloody stool noted.

## 2021-12-24 ENCOUNTER — Inpatient Hospital Stay: Payer: Self-pay

## 2021-12-24 DIAGNOSIS — A0472 Enterocolitis due to Clostridium difficile, not specified as recurrent: Secondary | ICD-10-CM | POA: Diagnosis present

## 2021-12-24 DIAGNOSIS — K50118 Crohn's disease of large intestine with other complication: Secondary | ICD-10-CM | POA: Diagnosis not present

## 2021-12-24 DIAGNOSIS — A419 Sepsis, unspecified organism: Secondary | ICD-10-CM | POA: Diagnosis not present

## 2021-12-24 DIAGNOSIS — R6520 Severe sepsis without septic shock: Secondary | ICD-10-CM | POA: Diagnosis not present

## 2021-12-24 HISTORY — DX: Enterocolitis due to Clostridium difficile, not specified as recurrent: A04.72

## 2021-12-24 LAB — HEMOGLOBIN A1C
Hgb A1c MFr Bld: 4.7 % — ABNORMAL LOW (ref 4.8–5.6)
Mean Plasma Glucose: 88.19 mg/dL

## 2021-12-24 LAB — BASIC METABOLIC PANEL
Anion gap: 9 (ref 5–15)
BUN: 32 mg/dL — ABNORMAL HIGH (ref 6–20)
CO2: 19 mmol/L — ABNORMAL LOW (ref 22–32)
Calcium: 7.9 mg/dL — ABNORMAL LOW (ref 8.9–10.3)
Chloride: 105 mmol/L (ref 98–111)
Creatinine, Ser: 1.05 mg/dL — ABNORMAL HIGH (ref 0.44–1.00)
GFR, Estimated: >60 mL/min (ref >60)
Glucose, Bld: 180 mg/dL — ABNORMAL HIGH (ref 70–99)
Potassium: 4.5 mmol/L (ref 3.5–5.1)
Sodium: 133 mmol/L — ABNORMAL LOW (ref 135–145)

## 2021-12-24 LAB — GASTROINTESTINAL PANEL BY PCR, STOOL (REPLACES STOOL CULTURE)

## 2021-12-24 LAB — C DIFFICILE QUICK SCREEN W PCR REFLEX
C Diff antigen: POSITIVE — AB
C Diff toxin: NEGATIVE

## 2021-12-24 LAB — MRSA NEXT GEN BY PCR, NASAL: MRSA by PCR Next Gen: NOT DETECTED

## 2021-12-24 LAB — CK: Total CK: 23 U/L — ABNORMAL LOW (ref 38–234)

## 2021-12-24 LAB — HEMOGLOBIN AND HEMATOCRIT, BLOOD
HCT: 49.5 % — ABNORMAL HIGH (ref 36.0–46.0)
Hemoglobin: 16.1 g/dL — ABNORMAL HIGH (ref 12.0–15.0)

## 2021-12-24 LAB — CLOSTRIDIUM DIFFICILE BY PCR, REFLEXED: Toxigenic C. Difficile by PCR: POSITIVE — AB

## 2021-12-24 LAB — GLUCOSE, CAPILLARY
Glucose-Capillary: 177 mg/dL — ABNORMAL HIGH (ref 70–99)
Glucose-Capillary: 148 mg/dL — ABNORMAL HIGH (ref 70–99)

## 2021-12-24 MED ORDER — CHLORHEXIDINE GLUCONATE CLOTH 2 % EX PADS
6.0000 | MEDICATED_PAD | Freq: Every day | CUTANEOUS | Status: DC
  Administered 2021-12-24 – 2022-01-03 (×13): 6 via TOPICAL

## 2021-12-24 MED ORDER — VASOPRESSIN 20 UNITS/100 ML INFUSION FOR SHOCK
0.0000 [IU]/min | INTRAVENOUS | Status: DC
  Administered 2021-12-24 – 2021-12-25 (×3): 0.03 [IU]/min via INTRAVENOUS
  Filled 2021-12-24 (×3): qty 100

## 2021-12-24 MED ORDER — SODIUM CHLORIDE 0.9 % IV SOLN
INTRAVENOUS | Status: DC
Start: 2021-12-24 — End: 2021-12-25

## 2021-12-24 MED ORDER — SODIUM CHLORIDE 0.9 % IV BOLUS
1000.0000 mL | Freq: Once | INTRAVENOUS | Status: AC
  Administered 2021-12-24: 1000 mL via INTRAVENOUS

## 2021-12-24 MED ORDER — ONDANSETRON HCL 4 MG/2ML IJ SOLN
4.0000 mg | Freq: Four times a day (QID) | INTRAMUSCULAR | Status: DC | PRN
  Administered 2021-12-24 – 2022-01-03 (×12): 4 mg via INTRAVENOUS
  Filled 2021-12-24 (×13): qty 2

## 2021-12-24 MED ORDER — SODIUM CHLORIDE 0.9 % IV BOLUS
500.0000 mL | Freq: Once | INTRAVENOUS | Status: AC
  Administered 2021-12-24: 500 mL via INTRAVENOUS

## 2021-12-24 MED ORDER — PROMETHAZINE HCL 25 MG/ML IJ SOLN
INTRAMUSCULAR | Status: AC
  Filled 2021-12-24: qty 1

## 2021-12-24 MED ORDER — METRONIDAZOLE 500 MG/100ML IV SOLN
500.0000 mg | Freq: Three times a day (TID) | INTRAVENOUS | Status: DC
  Administered 2021-12-24 – 2022-01-03 (×31): 500 mg via INTRAVENOUS
  Filled 2021-12-24 (×31): qty 100

## 2021-12-24 MED ORDER — SODIUM CHLORIDE 0.9% FLUSH
10.0000 mL | Freq: Two times a day (BID) | INTRAVENOUS | Status: DC
  Administered 2021-12-24 – 2021-12-28 (×9): 10 mL
  Administered 2021-12-29: 20 mL
  Administered 2021-12-29: 30 mL
  Administered 2021-12-30 – 2022-01-01 (×3): 10 mL

## 2021-12-24 MED ORDER — METRONIDAZOLE 500 MG/100ML IV SOLN: 500.0000 mg | Freq: Two times a day (BID) | INTRAVENOUS | Status: DC

## 2021-12-24 MED ORDER — METHYLPREDNISOLONE SODIUM SUCC 125 MG IJ SOLR
60.0000 mg | Freq: Every day | INTRAMUSCULAR | Status: DC
  Administered 2021-12-24 – 2022-01-03 (×11): 60 mg via INTRAVENOUS
  Filled 2021-12-24 (×11): qty 2

## 2021-12-24 MED ORDER — SODIUM CHLORIDE 0.9% FLUSH: 10.0000 mL | INTRAVENOUS | Status: DC | PRN

## 2021-12-24 MED ORDER — VANCOMYCIN HCL 125 MG PO CAPS
500.0000 mg | ORAL_CAPSULE | Freq: Four times a day (QID) | ORAL | Status: DC
  Administered 2021-12-24 – 2022-01-03 (×41): 500 mg via ORAL
  Filled 2021-12-24 (×4): qty 4
  Filled 2021-12-24: qty 2
  Filled 2021-12-24 (×8): qty 4
  Filled 2021-12-24: qty 2
  Filled 2021-12-24 (×2): qty 4
  Filled 2021-12-24: qty 2
  Filled 2021-12-24 (×13): qty 4
  Filled 2021-12-24: qty 2
  Filled 2021-12-24 (×16): qty 4
  Filled 2021-12-24: qty 2
  Filled 2021-12-24 (×4): qty 4

## 2021-12-24 NOTE — Progress Notes (Addendum)
PROGRESS NOTE    [name]  BLT:[id number] DOB: May 09, 1970 DOA: 12/23/2021 PCP: Teodoro Kil, PA-C   Brief Narrative:    [name] is a 51 y.o. female with medical history significant for Crohn's disease, hypertension, diabetes, seizures. Patient was found unresponsive at home. She reports chronic diarrhea, up to 10 times a day.  Reports intermittent vomiting-Chronic.  She has been admitted with severe sepsis, present on admission secondary to C. difficile colitis with associated acute metabolic encephalopathy as well as AKI.  She is also thought to have a UTI and is on multiple antibiotics.  GI consulted.  Assessment & Plan:   Principal Problem:   Severe sepsis ([place]) Active Problems:   Acute metabolic encephalopathy   Enterocolitis   AKI (acute kidney injury) ([place])   UTI (urinary tract infection)   COPD (chronic obstructive pulmonary disease) (HCC)   Crohn's disease ([place])   Diabetes mellitus without complication (HCC)   Lactic acidosis   Essential hypertension   MDD (major depressive disorder)   Seizure (HCC)   C. difficile colitis  Assessment and Plan:   Septic shock, present on admission, due to Cdiff colitis -Appreciate GI evaluation -Continue now on oral vancomycin as well as IV Flagyl -Continue supportive care with IV fluid and pressors -Start hydrocortisone stress dose and monitor response. -Continue to monitor closely   Chronic enterocolitis Diffuse abdominal tenderness, continues to have large-volume diarrhea.  History of Crohn's disease, not on treatment.  Reported blood in stools.  Hemoglobin stable. -History of Crohn's, appreciate GI evaluation with history of chronic diarrhea     Acute metabolic encephalopathy-improving Found down and unresponsive.  Mental status has improved in the ED.  Was likely down for 2 days. -Head CT with no acute findings -Still with some ongoing confusion   UTI (urinary tract infection) UA suggestive of UTI with  moderate leukocytes many bacteria. -IV ceftriaxone -Follow-up urine cultures currently pending   AKI (acute kidney injury)-improving Creatinine 1.58, baseline about 0.6-1.  Likely from dehydration, hypotension, sepsis. -CK levels are low -Hydrate   Seizure [facility]) Resume Keppra   Essential hypertension Initial hypotension systolic down to 00T.  Likely from no oral intake in the past 2 days, and sepsis. -Not on antihypertensives.   Diabetes mellitus without complication ([place]) Not on medication. - HgbA1c 4.7% -Monitor CBG daily   DVT prophylaxis:SCDs Code Status: Full Family Communication:None at bedside  Disposition Plan:  Status is: Inpatient Remains inpatient appropriate because: Need for pressors and IV medications.  Consultants:  GI  Procedures:  None  Antimicrobials:  Anti-infectives (From admission, onward)    Start     Dose/Rate Route Frequency Ordered Stop   12/24/21 1600  metroNIDAZOLE (FLAGYL) IVPB 500 mg  Status:  Discontinued        500 mg 100 mL/hr over 60 Minutes Intravenous Every 12 hours 12/24/21 0729 12/24/21 1141   12/24/21 1230  vancomycin (VANCOCIN) capsule 500 mg        500 mg Oral Every 6 hours 12/24/21 1141 01/07/22 1159   12/24/21 1230  metroNIDAZOLE (FLAGYL) IVPB 500 mg        500 mg 100 mL/hr over 60 Minutes Intravenous Every 8 hours 12/24/21 1141 01/07/22 1229   12/24/21 0400  ceFEPIme (MAXIPIME) 2 g in sodium chloride 0.9 % 100 mL IVPB  Status:  Discontinued        2 g 200 mL/hr over 30 Minutes Intravenous Every 12 hours 12/23/21 1535 12/23/21 2224   12/24/21 0400  cefTRIAXone (ROCEPHIN) 2  g in sodium chloride 0.9 % 100 mL IVPB        2 g 200 mL/hr over 30 Minutes Intravenous Every 24 hours 12/23/21 2224     12/24/21 0400  metroNIDAZOLE (FLAGYL) IVPB 500 mg        500 mg 100 mL/hr over 60 Minutes Intravenous  Once 12/23/21 2224 12/24/21 0430   12/23/21 1545  ceFEPIme (MAXIPIME) 2 g in sodium chloride 0.9 % 100 mL IVPB        2 g 200  mL/hr over 30 Minutes Intravenous  Once 12/23/21 1530 12/23/21 1615   12/23/21 1530  aztreonam (AZACTAM) 2 g in sodium chloride 0.9 % 100 mL IVPB  Status:  Discontinued        2 g 200 mL/hr over 30 Minutes Intravenous  Once 12/23/21 1523 12/23/21 1530   12/23/21 1530  metroNIDAZOLE (FLAGYL) IVPB 500 mg        500 mg 100 mL/hr over 60 Minutes Intravenous  Once 12/23/21 1523 12/23/21 1630      Subjective: Patient seen and evaluated today with ongoing abdominal pain.  She requires high dose of pressors to maintain blood pressure.  She appears confused this morning.  Objective: Vitals:   12/24/21 0615 12/24/21 0900 12/24/21 1000 12/24/21 1100  BP: (!) 110/55 (!) 95/34 (!) 113/30 (!) 111/26  Pulse: (!) 127 (!) 127 (!) 129 (!) 130  Resp: (!) 29 (!) 31 (!) 26 (!) 37  Temp:    (!) 101 F (38.3 C)  TempSrc:      SpO2: 98% 99% 99% 98%  Weight:      Height:        Intake/Output Summary (Last 24 hours) at 12/24/2021 1142 Last data filed at 12/24/2021 1128 Gross per 24 hour  Intake 5539.77 ml  Output 455 ml  Net 5084.77 ml   Filed Weights   12/23/21 1425 12/23/21 2250 12/24/21 0303  Weight: 52.6 kg 53.7 kg 55 kg    Examination:  General exam: Appears calm and comfortable, appears confused Respiratory system: Clear to auscultation. Respiratory effort normal. Cardiovascular system: S1 & S2 heard, RRR.  Gastrointestinal system: Abdomen is tender to palpation throughout Central nervous system: Alert and awake Extremities: No edema Skin: No significant lesions noted Psychiatry: Flat affect. Right femoral central venous line C/D/I Foley with clear, yellow urine output    Data Reviewed: I have personally reviewed following labs and imaging studies  CBC: Recent Labs  Lab 12/23/21 1524 12/23/21 1533 12/23/21 2343 12/24/21 0255  WBC 24.2*  --   --  17.9*  NEUTROABS 20.1*  --   --   --   HGB 11.1* 11.2* 16.1* 15.5*  HCT 35.1* 33.0* 49.5* 46.2*  MCV 102.0*  --   --  94.9  PLT  425*  --   --  195*   Basic Metabolic Panel: Recent Labs  Lab 12/23/21 1524 12/23/21 1533 12/24/21 0255  NA 137 136 133*  K 4.2 4.2 4.5  CL 109 106 105  CO2 20*  --  19*  GLUCOSE 134* 135* 180*  BUN 32* 28* 32*  CREATININE 1.54* 1.40* 1.05*  CALCIUM 7.5*  --  7.9*   GFR: Estimated Creatinine Clearance: 54.1 mL/min (A) (by C-G formula based on SCr of 1.05 mg/dL (H)). Liver Function Tests: Recent Labs  Lab 12/23/21 1524  AST 21  ALT 12  ALKPHOS 150*  BILITOT 0.6  PROT 4.3*  ALBUMIN 1.9*   No results for input(s): "LIPASE", "AMYLASE" in the  last 168 hours. No results for input(s): "AMMONIA" in the last 168 hours. Coagulation Profile: Recent Labs  Lab 12/23/21 1527  INR 1.2   Cardiac Enzymes: Recent Labs  Lab 12/23/21 2343  CKTOTAL 23*   BNP (last 3 results) No results for input(s): "PROBNP" in the last 8760 hours. HbA1C: Recent Labs    12/23/21 1529  HGBA1C 4.7*   CBG: Recent Labs  Lab 12/24/21 0646  GLUCAP 177*   Lipid Profile: No results for input(s): "CHOL", "HDL", "LDLCALC", "TRIG", "CHOLHDL", "LDLDIRECT" in the last 72 hours. Thyroid Function Tests: No results for input(s): "TSH", "T4TOTAL", "FREET4", "T3FREE", "THYROIDAB" in the last 72 hours. Anemia Panel: No results for input(s): "VITAMINB12", "FOLATE", "FERRITIN", "TIBC", "IRON", "RETICCTPCT" in the last 72 hours. Sepsis Labs: Recent Labs  Lab 12/23/21 1527 12/23/21 1721  LATICACIDVEN 5.9* 2.8*    Recent Results (from the past 240 hour(s))  Blood Culture (routine x 2)     Status: None (Preliminary result)   Collection Time: 12/23/21  3:27 PM   Specimen: Leg; Blood  Result Value Ref Range Status   Specimen Description LEG  Final   Special Requests   Final    BOTTLES DRAWN AEROBIC AND ANAEROBIC Blood Culture results may not be optimal due to an excessive volume of blood received in culture bottles   Culture   Final    NO GROWTH < 24 HOURS Performed at [facility], [address], [place], [place] [address]    Report Status PENDING  Incomplete  Resp Panel by RT-PCR (Flu A&B, Covid) Anterior Nasal Swab     Status: None   Collection Time: 12/23/21  3:45 PM   Specimen: Anterior Nasal Swab  Result Value Ref Range Status   SARS Coronavirus 2 by RT PCR NEGATIVE NEGATIVE Final    Comment: (NOTE) SARS-CoV-2 target nucleic acids are NOT DETECTED.  The SARS-CoV-2 RNA is generally detectable in upper respiratory specimens during the acute phase of infection. The lowest concentration of SARS-CoV-2 viral copies this assay can detect is 138 copies/mL. A negative result does not preclude SARS-Cov-2 infection and should not be used as the sole basis for treatment or other patient management decisions. A negative result may occur with  improper specimen collection/handling, submission of specimen other than nasopharyngeal swab, presence of viral mutation(s) within the areas targeted by this assay, and inadequate number of viral copies(<138 copies/mL). A negative result must be combined with clinical observations, patient history, and epidemiological information. The expected result is Negative.  Fact Sheet for Patients:  EntrepreneurPulse.com.au  Fact Sheet for Healthcare Providers:  IncredibleEmployment.be  This test is no t yet approved or cleared by the [place] FDA and  has been authorized for detection and/or diagnosis of SARS-CoV-2 by FDA under an Emergency Use Authorization (EUA). This EUA will remain  in effect (meaning this test can be used) for the duration of the COVID-19 declaration under Section 564(b)(1) of the Act, 21 U.S.C.section 360bbb-3(b)(1), unless the authorization is terminated  or revoked sooner.       Influenza A by PCR NEGATIVE NEGATIVE Final   Influenza B by PCR NEGATIVE NEGATIVE Final    Comment: (NOTE) The Xpert Xpress SARS-CoV-2/FLU/RSV plus assay is intended as an aid in the diagnosis of influenza  from Nasopharyngeal swab specimens and should not be used as a sole basis for treatment. Nasal washings and aspirates are unacceptable for Xpert Xpress SARS-CoV-2/FLU/RSV testing.  Fact Sheet for Patients: EntrepreneurPulse.com.au  Fact Sheet for Healthcare Providers: IncredibleEmployment.be  This test is not yet approved or cleared by the [place] and has been authorized for detection and/or diagnosis of SARS-CoV-2 by FDA under an Emergency Use Authorization (EUA). This EUA will remain in effect (meaning this test can be used) for the duration of the COVID-19 declaration under Section 564(b)(1) of the Act, 21 U.S.C. section 360bbb-3(b)(1), unless the authorization is terminated or revoked.  Performed at [facility], [address] [place], [place] [address]   MRSA Next Gen by PCR, Nasal     Status: None   Collection Time: 12/23/21 11:01 PM   Specimen: Nasal Mucosa; Nasal Swab  Result Value Ref Range Status   MRSA by PCR Next Gen NOT DETECTED NOT DETECTED Final    Comment: (NOTE) The GeneXpert MRSA Assay (FDA approved for NASAL specimens only), is one component of a comprehensive MRSA colonization surveillance program. It is not intended to diagnose MRSA infection nor to guide or monitor treatment for MRSA infections. Test performance is not FDA approved in patients less than 71 years old. Performed at [facility], [address] [place], [place] [address]   C Difficile Quick Screen w PCR reflex     Status: Abnormal   Collection Time: 12/24/21 12:24 AM   Specimen: Stool  Result Value Ref Range Status   C Diff antigen POSITIVE (A) NEGATIVE Final   C Diff toxin NEGATIVE NEGATIVE Final   C Diff interpretation Results are indeterminate. See PCR results.  Final    Comment: Performed at [facility], [address], [place], [place] [address]  C. Diff by PCR, Reflexed     Status: Abnormal   Collection Time: 12/24/21 12:24 AM  Result  Value Ref Range Status   Toxigenic C. Difficile by PCR POSITIVE (A) NEGATIVE Final    Comment: Positive for toxigenic C. difficile with little to no toxin production. Only treat if clinical presentation suggests symptomatic illness. Performed at [facility], [place] [address], [place], [place] [address]          Radiology Studies: [place] EKG SITE RITE  Result Date: 12/24/2021 If [facility] image not attached, placement could not be confirmed due to current cardiac rhythm.  CT HEAD WO CONTRAST (5MM)  Result Date: 12/23/2021 CLINICAL DATA:  Mental status change, unknown cause Falls, AMS, hypotension. EXAM: CT HEAD WITHOUT CONTRAST TECHNIQUE: Contiguous axial images were obtained from the base of the skull through the vertex without intravenous contrast. RADIATION DOSE REDUCTION: This exam was performed according to the departmental dose-optimization program which includes automated exposure control, adjustment of the mA and/or kV according to patient size and/or use of iterative reconstruction technique. COMPARISON:  None Available. FINDINGS: Brain: Normal anatomic configuration. No abnormal intra or extra-axial mass lesion or fluid collection. No abnormal mass effect or midline shift. No evidence of acute intracranial hemorrhage or infarct. Ventricular size is normal. Cerebellum unremarkable. Vascular: Unremarkable Skull: Intact Sinuses/Orbits: Paranasal sinuses are clear. Orbits are unremarkable. Other: Mastoid air cells and middle ear cavities are clear. IMPRESSION: No acute intracranial abnormality. Electronically Signed   By: Fidela Salisbury M.D.   On: 12/23/2021 21:58   CT ABDOMEN PELVIS W CONTRAST  Result Date: 12/23/2021 CLINICAL DATA:  Abdominal pain and bloating for 1 week. Coffee-ground emesis. EXAM: CT ABDOMEN AND PELVIS WITH CONTRAST TECHNIQUE: Multidetector CT imaging of the abdomen and pelvis was performed using the standard protocol following bolus administration of intravenous  contrast. RADIATION DOSE REDUCTION: This exam was performed according to the departmental dose-optimization program  which includes automated exposure control, adjustment of the mA and/or kV according to patient size and/or use of iterative reconstruction technique. CONTRAST:  72m OMNIPAQUE IOHEXOL 300 MG/ML  SOLN COMPARISON:  10/24/2021 FINDINGS: Lower Chest: Stable small pericardial effusion. Hepatobiliary: No hepatic masses identified. Prior cholecystectomy. No evidence of biliary obstruction. Pancreas:  No mass or inflammatory changes. Spleen: Within normal limits in size and appearance. Adrenals/Urinary Tract: No masses identified. No evidence of ureteral calculi or hydronephrosis. Foley catheter is seen within the bladder. Stomach/Bowel: Air-fluid levels are seen throughout the colon which is mildly distended, consistent with ileus. Mild wall thickening is again seen involving the rectum and distal sigmoid colon with mild perirectal inflammatory changes, consistent with proctocolitis. No evidence of abscess or bowel obstruction. Vascular/Lymphatic: No pathologically enlarged lymph nodes. No acute vascular findings. Right femoral central venous catheter in appropriate position. Reproductive: Prior hysterectomy noted. Adnexal regions are unremarkable in appearance. Other: Soft tissue gas noted in the right inguinal region and abductor muscles of the thigh, likely related to the right femoral venous catheter placement. Musculoskeletal:  No suspicious bone lesions identified. IMPRESSION: Mild proctocolitis involving the rectosigmoid, without significant change. No evidence of abscess or other complication. Mild colonic ileus. Stable small pericardial effusion. Electronically Signed   By: JMarlaine HindM.D.   On: 12/23/2021 19:08   DG Chest Port 1 View  Result Date: 12/23/2021 CLINICAL DATA:  Found unresponsive. EXAM: PORTABLE CHEST 1 VIEW COMPARISON:  March 20, 2021 FINDINGS: The heart size and mediastinal  contours are within normal limits. Both lungs are clear. The visualized skeletal structures are unremarkable. IMPRESSION: No active disease. Electronically Signed   By: TVirgina NorfolkM.D.   On: 12/23/2021 17:18   DG Abdomen 1 View  Result Date: 12/23/2021 CLINICAL DATA:  Central line placement EXAM: ABDOMEN - 1 VIEW COMPARISON:  None Available. FINDINGS: Right femoral central line is in place with the tip in the expected location of the common iliac vein. Bowel gas pattern is unremarkable. IMPRESSION: Central line on the right at the expected location of the common iliac vein. Electronically Signed   By: MNelson ChimesM.D.   On: 12/23/2021 15:36        Scheduled Meds:  Chlorhexidine Gluconate Cloth  6 each Topical Daily   levETIRAcetam  750 mg Oral BID   vancomycin  500 mg Oral Q6H   Continuous Infusions:  sodium chloride 100 mL/hr at 12/24/21 1128   cefTRIAXone (ROCEPHIN)  IV Stopped (12/24/21 0356)   metronidazole     norepinephrine (LEVOPHED) Adult infusion 15 mcg/min (12/24/21 1128)   promethazine (PHENERGAN) injection (IM or IVPB) Stopped (12/24/21 0447)     LOS: 1 day    Time spent: 35 minutes    Carlisle Enke DDarleen Crocker DO [facility]  If 7PM-7AM, please contact night-coverage [email or web address] 12/24/2021, 11:42 AM

## 2021-12-24 NOTE — TOC Progression Note (Signed)
  Transition of Care (TOC) Screening Note   Patient Details  Name: Ericka Marcellus Date of Birth: 20-Feb-1971   Transition of Care [facility]) CM/SW Contact:    Shade Flood, LCSW Phone Number: 12/24/2021, 11:34 AM    Transition of Care Department [facility] has reviewed patient and no TOC needs have been identified at this time. We will continue to monitor patient advancement through interdisciplinary progression rounds. If new patient transition needs arise, please place a TOC consult.

## 2021-12-24 NOTE — Consult Note (Addendum)
Gastroenterology Consult   Referring Provider: No ref. provider found Primary Care Physician:  Teodoro Kil, PA-C Primary Gastroenterologist:  [name] MD  Patient ID: [name]; [id number]; 05-28-1970   Admit date: 12/23/2021  LOS: 1 day   Date of Consultation: 12/24/2021  Reason for Consultation:  enterocolitis, bloody stools, crohn's    History of Present Illness   Luceil Swader is a 51 y.o. female with PMH significant for anxiety, asthma, COPD, GERD, hypertension, seizures, tobacco abuse, PTSD, type 2 diabetes, colonic stricturing Crohn's disease (diagnosed at age 37) who presented to the ED after being found unresponsive.  GI consulted for further management of Crohn's.  Most of the history is taken from [place]. Patient is alert but unable to provide reliable history due to confusion.   Patient presented via EMS after neighbor found her unresponsive laying in the bathroom floor with a large pile of blood tinged stool.  Neighbor had not seen the patient for 2 to 3 days, went to check on her.  EMS activated, on arrival they could not get her blood pressure or palpate peripheral pulses.  Given 150 cc of fluid, blood pressure noted to be in the 80s, patient lethargic.  By the time she arrived to the ED she was more alert.  In the ED initially her blood pressure was 100/70, declined to 67/41.  Temp 93.6, heart rate in the 90-110 range.  It was suspected that she may have substantial bleeding therefore 2 units of packed red blood cells were ordered.  Initial hemoglobin came back at 11.1, 1 unit of packed red blood cells was already hanging.  The other unit was canceled.  Central line was placed.  In the ED she had several episodes of stool incontinence, stool noted to have streaks of blood.  Several episodes of emesis, documentation that once smelled of stool.  Flexi-Seal placed to prevent stool from central line site.  On presentation white blood cell count 24,200, hemoglobin  11.1, platelets 425,000, BUN 32, creatinine 1.54, albumin 1.9, total bilirubin 0.6, alkaline phosphatase 150, AST 21, ALT 12.  Lactic acid 5.9-->2.8.  CT abdomen pelvis with contrast yesterday: Mild proctocolitis involving the rectosigmoid, without significant change.  Mild colonic ileus.  CT head without contrast yesterday with no acute intracranial abnormality.  Today: White blood cell count 17,900, hemoglobin 15.5, platelets 440,000, creatinine 1.05, BUN 32, sodium 133, potassium 4.5.  GI pathogen panel pending.  C. difficile antigen positive, C. difficile toxin negative, PCR pending.  Today: Pulse in the 120s, BP 110/55, Tmax 100.1.  Consult: patient states she has been so sick and trying to find someone to help her. She had forgotten that she saw Dr. Nyoka Cowden at [facility], citing that they haven't seen her and she doesn't have an appt until August. She has appt today, but was unaware. She has been seen by Dr. Nyoka Cowden in 10/2021 and follow up visits with to disciplinary IBD clinic have been missed due to patient being in the hospital.  She has been hospitalized multiple times over the past couple months.  She has been at [facility], [place], [facility], and most recently at [facility] from August 9 through August 12.  Patient did not recall seen overnight for that visit.  Seemed confused regarding this hospitalization.  During recent admission at [facility], she underwent flexible sigmoidoscopy on August 11 as outlined below.  She was discharged on prednisone 40 mg daily, patient desired ongoing care at  [facility] for a "second opinion" per discharge summary. She was discharged on prednisone taper. No antibiotics on D/C papers.  Several episodes of syncope, once every 1-2 weeks. States she does not get warnings anymore.   She has had Cdiff testing two weeks ago most c/w colonization per [facility].   States she had completed antibiotics the day before  admission but she is not sure what they were for. States she is on steroids. Stools 5-10 times per day. Diffuse abdominal pain but worse in lower abdomen. She has had multiple episodes of vomiting. Some blood in the stool. No melena.     Pertinent history:  She has a history of Crohn's that was untreated for multiple years prior to starting Remicade.  Partially responsive to Remicade but developed allergic reaction with pruritis/erythematous rash. She developed similar rash on Humira and took only two doses. Plans to avoid anti-TNF. Encouraged to try Stelara/Skyrizi or surgical approach for ileostomy due to her history of stricturing disease but she refused for surgery in the past.  She has been seen by Dr. Nyoka Cowden at [facility].  Plans for patient to see dermatology, ophthalmology, multidisciplinary IBD clinic with both GI and surgery.  She has been hospitalized this year at multiple facilities ([facility], [facility]) has an appointment at [facility] with Dr. Kennith Gain on September 18.  Missing today's appointment with Dr. Nyoka Cowden at [facility].   Underwent flexible sigmoidoscopy with GI December 11, 2021 at [facility].  Noted to have rectal stricture with inner diameter of 6 mm (exam accomplished with nasopharyngeal scope), circumferential ulceration present in the distal rectum and patchy ulcerations throughout the mid and proximal rectum.  Inflammation found in the rectosigmoid colon, mild in severity. Pathology with inactive chronic inflammatory disease of sigmoid colon, rectum with active chronic inflammatory disease and ulceration.  Started on prednisone taper at discharge.  Admitted to [facility] June 2023 with acute pancreatitis and colitis.  Tested positive for C. difficile, provided oral vancomycin.  Hospitalized [facility] July 19 through July 28.  C. difficile EIA - July 19.    Most recent attempted colonoscopy March 2023 was incomplete due to poor prep, revealed inflammation in the distal sigmoid colon and rectum which was severe but records of large amount of stool in the colon precluded visualization.  Presence of noncritical rectal stricture noted on DRE.  Pathology consistent with active colitis with ulceration and necroinflammatory debris.  November 18, 2021: C. difficile EIA negative.  GI pathogen panel negative.   December 11, 2021: C. difficile toxin EIA negative, PCR+ felt to be consistent with colonization. Giardia antigen negative, cryptosporidium antigen negative, O&P negative, stool culture negative x2.  CT abdomen and pelvis with contrast December 09, 2021: Significant wall thickening and adjacent soft tissue stranding of the rectum which extends to the rectosigmoid junction.  Presacral soft tissue stranding.  Normal-appearing terminal ileum.  Prior to Admission medications   Medication Sig Start Date End Date Taking? Authorizing Provider  albuterol (VENTOLIN HFA) 108 (90 Base) MCG/ACT inhaler Inhale 2 puffs into the lungs every 4 (four) hours as needed for wheezing or shortness of breath. 01/24/21   Roxan Hockey, MD  ALPRAZolam [name]) 1 MG tablet Take 1 mg by mouth 3 (three) times daily as needed for anxiety. 07/11/19   [provider]  amitriptyline (ELAVIL) 75 MG tablet Take 100 mg by mouth at bedtime. 12/16/20  [provider]          cyclobenzaprine (FLEXERIL) 10 MG tablet Take 10 mg by mouth 3 (three) times daily as needed for muscle spasms. 07/11/19   [provider]  folic acid (FOLVITE) 1 MG tablet Take 1 tablet (1 mg total) by mouth daily. 10/03/21   Orson Eva, MD  hydrOXYzine (ATARAX) 10 MG tablet Take 1 tablet (10 mg total) by mouth at bedtime. Patient taking differently: Take 50 mg by mouth every 8 (eight) hours as needed for anxiety. 09/07/21   Harvel Quale, MD  levETIRAcetam (KEPPRA) 750 MG tablet Take 750 mg  by mouth 2 (two) times daily. 09/30/20   [provider]  nitroGLYCERIN (NITROSTAT) 0.4 MG SL tablet Place 1 tablet (0.4 mg total) under the tongue every 5 (five) minutes as needed for chest pain. 03/19/21   Rogene Houston, MD  omeprazole (PRILOSEC) 40 MG capsule Take 40 mg by mouth daily. 07/21/19   [provider]  ondansetron (ZOFRAN-ODT) 4 MG disintegrating tablet Take 4 mg by mouth every 8 (eight) hours as needed for nausea or vomiting. 09/07/21   [provider]  RESTASIS 0.05 % ophthalmic emulsion Place 1 drop into both eyes 2 (two) times daily. 12/12/20   [provider]  SPIRIVA RESPIMAT 2.5 MCG/ACT AERS Inhale 1 puff into the lungs daily as needed (shortness of breath). Patient taking differently: Inhale 1 puff into the lungs daily. 01/24/21   Roxan Hockey, MD  triamcinolone cream (KENALOG) 0.1 % Apply topically 2 (two) times daily. 10/03/21   Orson Eva, MD  Prednisone 48m daily for 5 days, then drop by 132mevery 7 days  Current Facility-Administered Medications  Medication Dose Route Frequency Provider Last Rate Last Admin   0.9 %  sodium chloride infusion   Intravenous Continuous Emokpae, Ejiroghene E, MD 100 mL/hr at 12/24/21 0501 Infusion Verify at 12/24/21 0501   acetaminophen (TYLENOL) tablet 650 mg  650 mg Oral Q6H PRN Emokpae, Ejiroghene E, MD   650 mg at 12/24/21 0537   Or   acetaminophen (TYLENOL) suppository 650 mg  650 mg Rectal Q6H PRN Emokpae, Ejiroghene E, MD       cefTRIAXone (ROCEPHIN) 2 g in sodium chloride 0.9 % 100 mL IVPB  2 g Intravenous Q24H Emokpae, Ejiroghene E, MD   Stopped at 12/24/21 0356   Chlorhexidine Gluconate Cloth 2 % PADS 6 each  6 each Topical Daily Adefeso, Oladapo, DO   6 each at 12/24/21 0457   fentaNYL (SUBLIMAZE) injection 25 mcg  25 mcg Intravenous Q2H PRN Emokpae, Ejiroghene E, MD   25 mcg at 12/24/21 0128   levETIRAcetam (KEPPRA) tablet 750 mg  750 mg Oral BID Emokpae, Ejiroghene E, MD   750 mg at 12/23/21  2240   metroNIDAZOLE (FLAGYL) IVPB 500 mg  500 mg Intravenous Q8H [name], Pratik D, DO       norepinephrine (LEVOPHED) 105m36mn 250m36m.016 mg/mL) premix infusion  0-40 mcg/min Intravenous Titrated Emokpae, Ejiroghene E, MD 48.8 mL/hr at 12/24/21 0608 13 mcg/min at 12/24/21 [id number] glycol (MIRALAX / GLYCOLAX) packet 17 g  17 g Oral Daily PRN Emokpae, Ejiroghene E, MD       promethazine (PHENERGAN) 12.5 mg in sodium chloride 0.9 % 50 mL IVPB  12.5 mg Intravenous Q6H PRN Emokpae, Ejiroghene E, MD   Stopped at 12/24/21 0447    Allergies as of 12/23/2021 - Review Complete 12/23/2021  Allergen Reaction Noted   Codeine Shortness Of  Breath, Swelling, and Rash    Dilaudid [hydromorphone hcl] Shortness Of Breath and Swelling 12/26/2012   Hydrocodone Shortness Of Breath, Swelling, and Rash 12/26/2012   Morphine and related Shortness Of Breath, Swelling, and Rash 12/26/2012   Oxycodone Shortness Of Breath, Swelling, and Rash 12/26/2012   Penicillins Shortness Of Breath, Swelling, and Other (See Comments)    Acetaminophen  12/26/2012   Ativan [lorazepam] Other (See Comments) 12/20/2016   Strawberry extract Swelling 07/22/2011   Watermelon concentrate Swelling 07/22/2011   Albuterol Palpitations 07/08/2021   Benadryl [diphenhydramine hcl] Palpitations 09/23/2011   Latex Rash    Remicade [infliximab] Rash 07/08/2021    Past Medical History:  Diagnosis Date   Anxiety    Asthma    Chronic low back pain    Collagen vascular disease (HCC)    COPD (chronic obstructive pulmonary disease) (HCC)    Crohn's disease ([place])    GERD (gastroesophageal reflux disease)    EGD 01/2007 by Dr.Rourke small hiatal hernia s/p 56 french maloney    History of head injury    Hypertension    IBS (irritable bowel syndrome)    PSVT (paroxysmal supraventricular tachycardia) (HCC)    PTSD (post-traumatic stress disorder)    Recurrent chest pain    Seizure disorder ([place])    Type 2 diabetes mellitus ([place])      Past Surgical History:  Procedure Laterality Date   ABDOMINAL HYSTERECTOMY     with right salpingo oophorectomy 2005   APPENDECTOMY  2006   BALLOON DILATION N/A 02/19/2021   Procedure: BALLOON DILATION;  Surgeon: Eloise Harman, DO;  [facility];  Service: Endoscopy;  Laterality: N/A;  Sigmoid colon stricture   BIOPSY  02/06/2021   Procedure: BIOPSY;  Surgeon: Eloise Harman, DO;  [facility];  Service: Endoscopy;;   BIOPSY  02/19/2021   Procedure: BIOPSY;  Surgeon: Eloise Harman, DO;  [facility];  Service: Endoscopy;;   BIOPSY  07/15/2021   Procedure: BIOPSY;  Surgeon: Rogene Houston, MD;  [facility];  Service: Endoscopy;;   CESAREAN SECTION     1996   CHOLECYSTECTOMY     COLONOSCOPY  2010   Dr. Gala Romney; negative except for hemorrhoids   ESOPHAGEAL DILATION N/A 10/25/2014   Procedure: ESOPHAGEAL DILATION;  Surgeon: Rogene Houston, MD;  [facility];  Service: Endoscopy;  Laterality: N/A;   ESOPHAGOGASTRODUODENOSCOPY N/A 10/25/2014   Procedure: ESOPHAGOGASTRODUODENOSCOPY (EGD);  Surgeon: Rogene Houston, MD;  [facility];  Service: Endoscopy;  Laterality: N/A;  [place] N/A 02/06/2021   Procedure: FLEXIBLE SIGMOIDOSCOPY;  Surgeon: Eloise Harman, DO;  [facility];  Service: Endoscopy;  Laterality: N/A;   FLEXIBLE SIGMOIDOSCOPY N/A 02/19/2021   Procedure: FLEXIBLE SIGMOIDOSCOPY;  Surgeon: Eloise Harman, DO;  [facility];  Service: Endoscopy;  Laterality: N/A;   OOPHORECTOMY     left for torsion and ovarian fibroma; uterine myoma resected 1995   SIGMOIDOSCOPY  07/15/2021   Procedure: SIGMOIDOSCOPY;  Surgeon: Rogene Houston, MD;  [facility];  Service: Endoscopy;;    Family History  Problem Relation Age of Onset   Cancer Mother    Heart failure Mother    Hypertension Mother    Heart attack Mother    Heart attack Father 47   Cancer  Father 25   Heart failure Father    Diabetes Father    Crohn's disease Cousin  Social History   Socioeconomic History   Marital status: Divorced    Spouse name: Not on file   Number of children: Not on file   Years of education: Not on file   Highest education level: Not on file  Occupational History   Not on file  Tobacco Use   Smoking status: Some Days    Packs/day: 0.50    Years: 20.00    Total pack years: 10.00    Types: Cigarettes    Start date: 09/26/1984    Passive exposure: Current   Smokeless tobacco: Never  Vaping Use   Vaping Use: Never used  Substance and Sexual Activity   Alcohol use: No    Alcohol/week: 0.0 standard drinks of alcohol   Drug use: Not Currently   Sexual activity: Yes    Birth control/protection: Surgical  Other Topics Concern   Not on file  Social History Narrative   Not on file   Social Determinants of Health   Financial Resource Strain: Not on file  Food Insecurity: Not on file  Transportation Needs: Not on file  Physical Activity: Not on file  Stress: Not on file  Social Connections: Not on file  Intimate Partner Violence: Not on file     Review of System:  Diffucult to obtain due to her confusion General: Negative for  weight loss, fever, chills, fatigue. + weakness, poor appetite Eyes: Negative for vision changes.  ENT: Negative for hoarseness, difficulty swallowing , nasal congestion. CV: Negative for chest pain, angina, palpitations, dyspnea on exertion, peripheral edema.  Respiratory: Negative for dyspnea at rest, dyspnea on exertion, cough, sputum, wheezing.  GI: See history of present illness. GU:  Negative for dysuria, hematuria, urinary incontinence, urinary frequency, nocturnal urination.  MS: Negative for joint pain, low back pain.  Derm: Negative for rash or itching.  Neuro: Negative for weakness, abnormal sensation, seizure, frequent headaches, memory loss, +confusion. H/o syncopal episodes Psych: Negative for   suicidal ideation, hallucinations. +anxiety Endo: Negative for unusual weight change.  Heme: Negative for bruising or bleeding. Allergy: Negative for rash or hives.      Physical Examination:   Vital signs in last 24 hours: Temp:  [93.6 F (34.2 C)-100.1 F (37.8 C)] 100.1 F (37.8 C) (08/24 0530) Pulse Rate:  [90-127] 127 (08/24 0615) Resp:  [13-38] 29 (08/24 0615) BP: (67-113)/(16-86) 110/55 (08/24 0615) SpO2:  [97 %-100 %] 98 % (08/24 0615) Weight:  [52.6 kg-55 kg] 55 kg (08/24 0303)    General: appears older than states age. Ill appearing. Alert and oriented to person. Was unsure of date or place. Unable to provide significant history. no acute distress.  Head: Normocephalic, atraumatic.   Eyes: Conjunctiva pink, no icterus. Mouth: Oropharyngeal mucosa dry.  and pink , no lesions erythema or exudate. Neck: Supple without thyromegaly, masses, or lymphadenopathy.  Lungs: Clear to auscultation bilaterally.  Heart: Regular rate and rhythm, no murmurs rubs or gallops.  Abdomen: Bowel sounds are normal, no hepatosplenomegaly or masses, no abdominal bruits or hernia , no rebound or guarding.  Distended, firm. Diffuse tenderness, more in lower abdomen with palpation Rectal: rectocele in place with liquid brown stool Extremities: No lower extremity edema, clubbing, deformity.  Neuro: Alert and oriented x 1 , grossly normal neurologically.  Skin: Warm and dry, no jaundice.   Psych: Alert and cooperative, flat affect.        Intake/Output from previous day: 08/23 0701 - 08/24 0700 In: 4580 [I.V.:929.9; IV Piggyback:3650.1] Out: 330 [Urine:330] Intake/Output  this shift: No intake/output data recorded.  Lab Results:   CBC Recent Labs    12/23/21 1524 12/23/21 1533 12/23/21 2343 12/24/21 0255  WBC 24.2*  --   --  17.9*  HGB 11.1* 11.2* 16.1* 15.5*  HCT 35.1* 33.0* 49.5* 46.2*  MCV 102.0*  --   --  94.9  PLT 425*  --   --  440*   BMET Recent Labs    12/23/21 1524  12/23/21 1533 12/24/21 0255  NA 137 136 133*  K 4.2 4.2 4.5  CL 109 106 105  CO2 20*  --  19*  GLUCOSE 134* 135* 180*  BUN 32* 28* 32*  CREATININE 1.54* 1.40* 1.05*  CALCIUM 7.5*  --  7.9*   LFT Recent Labs    12/23/21 1524  BILITOT 0.6  ALKPHOS 150*  AST 21  ALT 12  PROT 4.3*  ALBUMIN 1.9*    Lipase No results for input(s): "LIPASE" in the last 72 hours.  PT/INR Recent Labs    12/23/21 1527  LABPROT 14.9  INR 1.2     Hepatitis Panel No results for input(s): "HEPBSAG", "HCVAB", "HEPAIGM", "HEPBIGM" in the last 72 hours.   Imaging Studies:   CT HEAD WO CONTRAST (5MM)  Result Date: 12/23/2021 CLINICAL DATA:  Mental status change, unknown cause Falls, AMS, hypotension. EXAM: CT HEAD WITHOUT CONTRAST TECHNIQUE: Contiguous axial images were obtained from the base of the skull through the vertex without intravenous contrast. RADIATION DOSE REDUCTION: This exam was performed according to the departmental dose-optimization program which includes automated exposure control, adjustment of the mA and/or kV according to patient size and/or use of iterative reconstruction technique. COMPARISON:  None Available. FINDINGS: Brain: Normal anatomic configuration. No abnormal intra or extra-axial mass lesion or fluid collection. No abnormal mass effect or midline shift. No evidence of acute intracranial hemorrhage or infarct. Ventricular size is normal. Cerebellum unremarkable. Vascular: Unremarkable Skull: Intact Sinuses/Orbits: Paranasal sinuses are clear. Orbits are unremarkable. Other: Mastoid air cells and middle ear cavities are clear. IMPRESSION: No acute intracranial abnormality. Electronically Signed   By: Fidela Salisbury M.D.   On: 12/23/2021 21:58   CT ABDOMEN PELVIS W CONTRAST  Result Date: 12/23/2021 CLINICAL DATA:  Abdominal pain and bloating for 1 week. Coffee-ground emesis. EXAM: CT ABDOMEN AND PELVIS WITH CONTRAST TECHNIQUE: Multidetector CT imaging of the abdomen and  pelvis was performed using the standard protocol following bolus administration of intravenous contrast. RADIATION DOSE REDUCTION: This exam was performed according to the departmental dose-optimization program which includes automated exposure control, adjustment of the mA and/or kV according to patient size and/or use of iterative reconstruction technique. CONTRAST:  27m OMNIPAQUE IOHEXOL 300 MG/ML  SOLN COMPARISON:  10/24/2021 FINDINGS: Lower Chest: Stable small pericardial effusion. Hepatobiliary: No hepatic masses identified. Prior cholecystectomy. No evidence of biliary obstruction. Pancreas:  No mass or inflammatory changes. Spleen: Within normal limits in size and appearance. Adrenals/Urinary Tract: No masses identified. No evidence of ureteral calculi or hydronephrosis. Foley catheter is seen within the bladder. Stomach/Bowel: Air-fluid levels are seen throughout the colon which is mildly distended, consistent with ileus. Mild wall thickening is again seen involving the rectum and distal sigmoid colon with mild perirectal inflammatory changes, consistent with proctocolitis. No evidence of abscess or bowel obstruction. Vascular/Lymphatic: No pathologically enlarged lymph nodes. No acute vascular findings. Right femoral central venous catheter in appropriate position. Reproductive: Prior hysterectomy noted. Adnexal regions are unremarkable in appearance. Other: Soft tissue gas noted in the right inguinal region and abductor  muscles of the thigh, likely related to the right femoral venous catheter placement. Musculoskeletal:  No suspicious bone lesions identified. IMPRESSION: Mild proctocolitis involving the rectosigmoid, without significant change. No evidence of abscess or other complication. Mild colonic ileus. Stable small pericardial effusion. Electronically Signed   By: Marlaine Hind M.D.   On: 12/23/2021 19:08   DG Chest Port 1 View  Result Date: 12/23/2021 CLINICAL DATA:  Found unresponsive. EXAM:  PORTABLE CHEST 1 VIEW COMPARISON:  March 20, 2021 FINDINGS: The heart size and mediastinal contours are within normal limits. Both lungs are clear. The visualized skeletal structures are unremarkable. IMPRESSION: No active disease. Electronically Signed   By: Virgina Norfolk M.D.   On: 12/23/2021 17:18   DG Abdomen 1 View  Result Date: 12/23/2021 CLINICAL DATA:  Central line placement EXAM: ABDOMEN - 1 VIEW COMPARISON:  None Available. FINDINGS: Right femoral central line is in place with the tip in the expected location of the common iliac vein. Bowel gas pattern is unremarkable. IMPRESSION: Central line on the right at the expected location of the common iliac vein. Electronically Signed   By: Nelson Chimes M.D.   On: 12/23/2021 15:36  [4 week]  Assessment:   51 year old female with past medical history of asthma, COPD, anxiety, PTSD, type 2 diabetes (not on medication), seizures, hypertension, GERD, Crohn's colitis with history of stricturing (initially diagnosed at age 57) who presented to the ED via EMS after neighbor found patient unresponsive on the bathroom floor.  Noted to have large volume stool with streaks of blood, hypotensive, hypothermic.  GI consulted for further management.  Crohn's colitis/diarrhea: Complex history as outlined above.  Allergic reactions to Remicade and Humira.  Plans to avoid anti-TNF agents.  Numerous hospitalizations this year at multiple facilities.  She was referred to the IBD clinic at [place], saw Dr. Nyoka Cowden who is in the process of work-up to hopefully initiate Stelara versus surgical management but care has been interrupted by multiple hospitalizations causing missed appointments.  Most recently hospitalized at [facility] with plans for possible follow-up there.  She also has a [facility] appointment in September.  Disjointed care has made things difficult.    Was on prednisone taper at home.  Patient treated for C. difficile 2 months ago  while at [facility] due to positive C. difficile PCR.  Since then has had negative C. difficile EIA antigen/toxin July 19 and August 11 had positive C. difficile PCR but negative for toxins likely representing colonization.  Today her C. difficile antigen positive, toxin negative, PCR positive for toxigenic C. difficile with little to no toxin production.  She has mild proctocolitis involving the rectosigmoid, mild colonic ileus. What sounds like worsening of her diarrhea since last week. Elevated WBC improved, likely somewhat due to steroid affect. She is requiring levophed for BP support at this time.   Syncope: Patient gives history of recurrent syncopal episodes, used to have warning but now does not.  Unable to provide substantial history today.  Work-up per attending.  Plan:   Supportive measures. Ideally she would follow through with single multidisciplinary IBD clinic for continuity of care. Chronic maintenance medication necessary vs surgical intervention (patient has declined numerous times).  Hold off on steroids for now given possible infectious process.  Continue to monitor stool output.      LOS: 1 day   We would like to thank you for the opportunity to participate in the care of [place].  Magda Paganini  S. Bernarda Caffey [facility] [phone] 8/24/20237:50 AM  Addendum: Discussed with Dr. Jenetta Downer. Start IV solumedrol 7m daily as she was recently discharged on steroid taper. She may be colonized with Cdiff. Given increased stools, reasonable to treat with oral vancomycin.   LLaureen Ochs LBernarda CaffeyRLittle [facility] [phone]/24/20234:17 PM

## 2021-12-24 NOTE — Progress Notes (Signed)
Peripherally Inserted Central Catheter Placement  The IV Nurse has discussed with the patient and/or persons authorized to consent for the patient, the purpose of this procedure and the potential benefits and risks involved with this procedure.  The benefits include less needle sticks, lab draws from the catheter, and the patient may be discharged home with the catheter. Risks include, but not limited to, infection, bleeding, blood clot (thrombus formation), and puncture of an artery; nerve damage and irregular heartbeat and possibility to perform a PICC exchange if needed/ordered by physician.  Alternatives to this procedure were also discussed.  Bard Power PICC patient education guide, fact sheet on infection prevention and patient information card has been provided to patient /or left at bedside.  PICC inserted by Valentina Shaggy, RN   PICC Placement Documentation  PICC Triple Lumen 29/92/42 Right Basilic 37 cm 1 cm (Active)  Indication for Insertion or Continuance of Line Vasoactive infusions 12/24/21 1728  Exposed Catheter (cm) 1 cm 12/24/21 1728  Site Assessment Clean;Dry;Intact 12/24/21 1728  Lumen #1 Status Flushed;Saline locked;Blood return noted 12/24/21 1728  Lumen #2 Status Flushed;Saline locked;Blood return noted 12/24/21 1728  Lumen #3 Status Flushed;Saline locked;Blood return noted 12/24/21 1728  Dressing Type Transparent;Securing device 12/24/21 1728  Dressing Status Antimicrobial disc in place 12/24/21 1728  Safety Lock Not Applicable 68/34/19 6222  Line Care Connections checked and tightened 12/24/21 1728  Line Adjustment (NICU/IV Team Only) No 12/24/21 1728  Dressing Intervention New dressing 12/24/21 1728  Dressing Change Due 12/31/21 12/24/21 [place], Nicolette Bang 12/24/2021, 5:29 PM

## 2021-12-24 NOTE — Progress Notes (Signed)
Pt's HR still sustaining to 120s, Dr. Josephine Cables informed. No orders made. Pt denies any chest pain and other discomfort at this time.

## 2021-12-25 DIAGNOSIS — R6520 Severe sepsis without septic shock: Secondary | ICD-10-CM | POA: Diagnosis not present

## 2021-12-25 DIAGNOSIS — A419 Sepsis, unspecified organism: Secondary | ICD-10-CM | POA: Diagnosis not present

## 2021-12-25 LAB — COMPREHENSIVE METABOLIC PANEL
ALT: 14 U/L (ref 0–44)
AST: 34 U/L (ref 15–41)
Albumin: 1.7 g/dL — ABNORMAL LOW (ref 3.5–5.0)
Alkaline Phosphatase: 119 U/L (ref 38–126)
Anion gap: 2 — ABNORMAL LOW (ref 5–15)
BUN: 18 mg/dL (ref 6–20)
CO2: 23 mmol/L (ref 22–32)
Calcium: 6.9 mg/dL — ABNORMAL LOW (ref 8.9–10.3)
Chloride: 102 mmol/L (ref 98–111)
Creatinine, Ser: 0.54 mg/dL (ref 0.44–1.00)
GFR, Estimated: >60 mL/min (ref >60)
Glucose, Bld: 148 mg/dL — ABNORMAL HIGH (ref 70–99)
Potassium: 4.5 mmol/L (ref 3.5–5.1)
Sodium: 127 mmol/L — ABNORMAL LOW (ref 135–145)
Total Bilirubin: 0.6 mg/dL (ref 0.3–1.2)
Total Protein: 4.2 g/dL — ABNORMAL LOW (ref 6.5–8.1)

## 2021-12-25 LAB — CBC
HCT: 33.6 % — ABNORMAL LOW (ref 36.0–46.0)
Hemoglobin: 11.3 g/dL — ABNORMAL LOW (ref 12.0–15.0)
MCH: 31.6 pg (ref 26.0–34.0)
MCHC: 33.6 g/dL (ref 30.0–36.0)
MCV: 93.9 fL (ref 80.0–100.0)
Platelets: 238 10*3/uL (ref 150–400)
RBC: 3.58 MIL/uL — ABNORMAL LOW (ref 3.87–5.11)
RDW: 14.6 % (ref 11.5–15.5)
WBC: 20 10*3/uL — ABNORMAL HIGH (ref 4.0–10.5)
nRBC: 0 % (ref 0.0–0.2)

## 2021-12-25 LAB — FOLATE: Folate: 6.6 ng/mL (ref >5.9)

## 2021-12-25 LAB — GLUCOSE, CAPILLARY
Glucose-Capillary: 145 mg/dL — ABNORMAL HIGH (ref 70–99)
Glucose-Capillary: 106 mg/dL — ABNORMAL HIGH (ref 70–99)

## 2021-12-25 LAB — OSMOLALITY: Osmolality: 283 mOsm/kg (ref 275–295)

## 2021-12-25 LAB — VITAMIN B12: Vitamin B-12: 142 pg/mL — ABNORMAL LOW (ref 180–914)

## 2021-12-25 LAB — TSH: TSH: 0.832 u[IU]/mL (ref 0.350–4.500)

## 2021-12-25 LAB — MAGNESIUM: Magnesium: 2.3 mg/dL (ref 1.7–2.4)

## 2021-12-25 LAB — SODIUM, URINE, RANDOM: Sodium, Ur: 13 mmol/L

## 2021-12-25 LAB — OSMOLALITY, URINE: Osmolality, Ur: 73 mOsm/kg — ABNORMAL LOW (ref 300–900)

## 2021-12-25 LAB — LACTIC ACID, PLASMA: Lactic Acid, Venous: 1.2 mmol/L (ref 0.5–1.9)

## 2021-12-25 MED ORDER — ALPRAZOLAM 1 MG PO TABS
1.0000 mg | ORAL_TABLET | Freq: Three times a day (TID) | ORAL | Status: DC | PRN
  Administered 2021-12-25 – 2022-01-03 (×25): 1 mg via ORAL
  Filled 2021-12-25: qty 2
  Filled 2021-12-25: qty 1
  Filled 2021-12-25 (×2): qty 2
  Filled 2021-12-25 (×2): qty 1
  Filled 2021-12-25: qty 2
  Filled 2021-12-25 (×2): qty 1
  Filled 2021-12-25: qty 2
  Filled 2021-12-25: qty 1
  Filled 2021-12-25: qty 2
  Filled 2021-12-25: qty 1
  Filled 2021-12-25: qty 2
  Filled 2021-12-25 (×2): qty 1
  Filled 2021-12-25: qty 2
  Filled 2021-12-25 (×2): qty 1
  Filled 2021-12-25 (×2): qty 2
  Filled 2021-12-25: qty 1
  Filled 2021-12-25: qty 2
  Filled 2021-12-25: qty 1
  Filled 2021-12-25: qty 2

## 2021-12-25 MED ORDER — SODIUM CHLORIDE 0.9 % IV SOLN: INTRAVENOUS | Status: DC | PRN

## 2021-12-25 MED ORDER — PANTOPRAZOLE SODIUM 40 MG PO TBEC
40.0000 mg | DELAYED_RELEASE_TABLET | Freq: Every day | ORAL | Status: DC
  Administered 2021-12-25 – 2021-12-30 (×6): 40 mg via ORAL
  Filled 2021-12-25 (×6): qty 1

## 2021-12-25 MED ORDER — CYCLOBENZAPRINE HCL 10 MG PO TABS
10.0000 mg | ORAL_TABLET | Freq: Three times a day (TID) | ORAL | Status: DC | PRN
  Administered 2021-12-25 – 2022-01-03 (×19): 10 mg via ORAL
  Filled 2021-12-25 (×19): qty 1

## 2021-12-25 MED ORDER — AMITRIPTYLINE HCL 25 MG PO TABS
100.0000 mg | ORAL_TABLET | Freq: Every day | ORAL | Status: DC
  Administered 2021-12-25 – 2022-01-02 (×9): 100 mg via ORAL
  Filled 2021-12-25 (×9): qty 4

## 2021-12-25 NOTE — Progress Notes (Signed)
During assessment, pt reported substernal pain "feels like someone has their knee on me."  Pt NAD, skin pale per usual but dry, VSS on monitor, resps even, unlabored.  When asked, pt does report hx of GERD, takes prilosec at home.  EKG performed to r/o any other issues, shows NSR.  Reviewed with provider, order received for protonix.  Will continue to monitor for any s/s of distress.

## 2021-12-25 NOTE — Evaluation (Signed)
Clinical/Bedside Swallow Evaluation Patient Details  Name: [name] MRN: [id number] Date of Birth: 11/21/1970  Today's Date: 12/25/2021 Time: SLP Start Time (ACUTE ONLY): 1506 SLP Stop Time (ACUTE ONLY): 4098 SLP Time Calculation (min) (ACUTE ONLY): 27 min  Past Medical History:  Past Medical History:  Diagnosis Date   Anxiety    Asthma    Chronic low back pain    Collagen vascular disease (HCC)    COPD (chronic obstructive pulmonary disease) (HCC)    Crohn's disease ([place])    GERD (gastroesophageal reflux disease)    EGD 01/2007 by Dr.Rourke small hiatal hernia s/p 56 french maloney    History of head injury    Hypertension    IBS (irritable bowel syndrome)    PSVT (paroxysmal supraventricular tachycardia) (HCC)    PTSD (post-traumatic stress disorder)    Recurrent chest pain    Seizure disorder ([place])    Type 2 diabetes mellitus ([place])    Past Surgical History:  Past Surgical History:  Procedure Laterality Date   ABDOMINAL HYSTERECTOMY     with right salpingo oophorectomy 2005   APPENDECTOMY  2006   BALLOON DILATION N/A 02/19/2021   Procedure: BALLOON DILATION;  Surgeon: Eloise Harman, DO;  [facility];  Service: Endoscopy;  Laterality: N/A;  Sigmoid colon stricture   BIOPSY  02/06/2021   Procedure: BIOPSY;  Surgeon: Eloise Harman, DO;  [facility];  Service: Endoscopy;;   BIOPSY  02/19/2021   Procedure: BIOPSY;  Surgeon: Eloise Harman, DO;  [facility];  Service: Endoscopy;;   BIOPSY  07/15/2021   Procedure: BIOPSY;  Surgeon: Rogene Houston, MD;  [facility];  Service: Endoscopy;;   CESAREAN SECTION     1996   CHOLECYSTECTOMY     COLONOSCOPY  2010   Dr. Gala Romney; negative except for hemorrhoids   ESOPHAGEAL DILATION N/A 10/25/2014   Procedure: ESOPHAGEAL DILATION;  Surgeon: Rogene Houston, MD;  [facility];  Service: Endoscopy;  Laterality: N/A;   ESOPHAGOGASTRODUODENOSCOPY N/A 10/25/2014    Procedure: ESOPHAGOGASTRODUODENOSCOPY (EGD);  Surgeon: Rogene Houston, MD;  [facility];  Service: Endoscopy;  Laterality: N/A;  [place] N/A 02/06/2021   Procedure: FLEXIBLE SIGMOIDOSCOPY;  Surgeon: Eloise Harman, DO;  [facility];  Service: Endoscopy;  Laterality: N/A;   FLEXIBLE SIGMOIDOSCOPY N/A 02/19/2021   Procedure: FLEXIBLE SIGMOIDOSCOPY;  Surgeon: Eloise Harman, DO;  [facility];  Service: Endoscopy;  Laterality: N/A;   OOPHORECTOMY     left for torsion and ovarian fibroma; uterine myoma resected 1995   SIGMOIDOSCOPY  07/15/2021   Procedure: SIGMOIDOSCOPY;  Surgeon: Rogene Houston, MD;  [facility];  Service: Endoscopy;;   HPI:  [name] is a 51 y.o. female with medical history significant for Crohn's disease, hypertension, diabetes, seizures. Patient was found unresponsive at home. She reports chronic diarrhea, up to 10 times a day.  Reports intermittent vomiting-Chronic.  She has been admitted with severe sepsis, present on admission secondary to C. difficile colitis with associated acute metabolic encephalopathy as well as AKI.  She is also thought to have a UTI and is on multiple antibiotics.  GI consulted with recommendations to transfer to tertiary care center, but no beds are currently available at multiple facilities.    Assessment / Plan / Recommendation  Clinical Impression  Clinical swallowing evaluation completed while Pt was sitting upright in bed; Pt reports  difficulty with pills this am and difficulty swallowing the past two mornings (she reports this was completely new and not normal for her). Pt assessment was limited to liquids secondary to GI diet of full liquids. Pt consumed ice chips and thin liquids without overt s/sx of aspiration. Pt does report hx of esophageal dilation and reports globus sensation with trials. SLP will follow and will provide ongoing diagnostic assessment with solid  textures after diet is progressed by GI. There are no concerns for Pt continuing with thin liquid diet at this time, secondary to no overt s/sx of oropharyngeal dysphagia being noted with trials. Communicated to RN that moistening Pt's mouth with ice chips or toothette may help facilitate oral management of meds; also consider offering meds with puree or jello if difficulty persists. Defer to GI for diet recommendations and advancement and ST will follow to assess with solid textures with medically appropriate. Thank you, SLP Visit Diagnosis: Dysphagia, unspecified (R13.10)       Diet Recommendation Thin liquid   Liquid Administration via: Cup;Straw;Spoon Medication Administration: Whole meds with puree Compensations: Slow rate;Small sips/bites Postural Changes: Seated upright at 90 degrees    Other  Recommendations Oral Care Recommendations: Oral care BID    Recommendations for follow up therapy are one component of a multi-disciplinary discharge planning process, led by the attending physician.  Recommendations may be updated based on patient status, additional functional criteria and insurance authorization.           Frequency and Duration min 1 x/week  1 week       Prognosis Prognosis for Safe Diet Advancement: Fair      Swallow Study   General Date of Onset: 12/24/21 HPI: [name] is a 51 y.o. female with medical history significant for Crohn's disease, hypertension, diabetes, seizures. Patient was found unresponsive at home. She reports chronic diarrhea, up to 10 times a day.  Reports intermittent vomiting-Chronic.  She has been admitted with severe sepsis, present on admission secondary to C. difficile colitis with associated acute metabolic encephalopathy as well as AKI.  She is also thought to have a UTI and is on multiple antibiotics.  GI consulted with recommendations to transfer to tertiary care center, but no beds are currently available at multiple facilities. Type  of Study: Bedside Swallow Evaluation Previous Swallow Assessment: none in chart Diet Prior to this Study: Thin liquids Temperature Spikes Noted: No Respiratory Status: Room air History of Recent Intubation: No Behavior/Cognition: Alert;Cooperative;Pleasant mood Oral Cavity Assessment: Within Functional Limits Oral Cavity - Dentition: Adequate natural dentition Vision: Functional for self-feeding Self-Feeding Abilities: Able to feed self Patient Positioning: Upright in bed Baseline Vocal Quality: Normal Volitional Cough: Strong Volitional Swallow: Able to elicit    Oral/Motor/Sensory Function Overall Oral Motor/Sensory Function: Within functional limits   Ice Chips Ice chips: Within functional limits   Thin Liquid Thin Liquid: Within functional limits    Nectar Thick Nectar Thick Liquid: Not tested   Honey Thick Honey Thick Liquid: Not tested   Puree Puree: Not tested   Solid     Solid: Not tested     [name] H. Roddie Mc, CCC-SLP Speech Language Pathologist  Wende Bushy 12/25/2021,3:33 PM

## 2021-12-25 NOTE — Progress Notes (Signed)
PROGRESS NOTE    [name]  TUU:[id number] DOB: 1970/07/20 DOA: 12/23/2021 PCP: Teodoro Kil, PA-C   Brief Narrative:    [name] is a 51 y.o. female with medical history significant for Crohn's disease, hypertension, diabetes, seizures. Patient was found unresponsive at home. She reports chronic diarrhea, up to 10 times a day.  Reports intermittent vomiting-Chronic.  She has been admitted with severe sepsis, present on admission secondary to C. difficile colitis with associated acute metabolic encephalopathy as well as AKI.  She is also thought to have a UTI and is on multiple antibiotics.  GI consulted with recommendations to transfer to tertiary care center, but no beds are currently available at multiple facilities.  Assessment & Plan:   Principal Problem:   Severe sepsis ([place]) Active Problems:   Acute metabolic encephalopathy   Enterocolitis   AKI (acute kidney injury) ([place])   UTI (urinary tract infection)   COPD (chronic obstructive pulmonary disease) (HCC)   Crohn's disease ([place])   Diabetes mellitus without complication (HCC)   Lactic acidosis   Essential hypertension   MDD (major depressive disorder)   Seizure (HCC)   C. difficile colitis  Assessment and Plan:   Septic shock, present on admission, due to Cdiff colitis -Appreciate ongoing GI evaluation -Continue now on oral vancomycin as well as IV Flagyl -Continue supportive care with IV fluid and pressors and wean pressors as tolerated -Started on Solu-Medrol IV daily -Continue to monitor closely   Chronic enterocolitis Diffuse abdominal tenderness, continues to have large-volume diarrhea.  History of Crohn's disease, not on treatment.  Reported blood in stools.  Hemoglobin stable. -History of Crohn's, appreciate GI evaluation with history of chronic diarrhea -Attempting to transfer to multiple facilities  Hyponatremia Appears slightly hypervolemic Stop IV fluid and monitor Check TSH and osmolarity  studies     Acute metabolic encephalopathy-improving Found down and unresponsive.  Mental status has improved in the ED.  Was likely down for 2 days. -Head CT with no acute findings -Still with some ongoing confusion   E. coli UTI (urinary tract infection) UA suggestive of UTI with moderate leukocytes many bacteria. -IV ceftriaxone -Susceptibilities pending   AKI (acute kidney injury)-resolved Creatinine 1.58, baseline about 0.6-1.  Likely from dehydration, hypotension, sepsis. -Hold further IV fluid at this time   Seizure [facility]) Resumed Keppra   Essential hypertension Initial hypotension systolic down to 79X.  Likely from no oral intake in the past 2 days, and sepsis. -Not on antihypertensives.   Diabetes mellitus without complication ([place]) Not on medication. - HgbA1c 4.7% -Monitor CBG daily   DVT prophylaxis:SCDs Code Status: Full Family Communication:Discussed with daughter Luetta Nutting [phone] Disposition Plan:  Status is: Inpatient Remains inpatient appropriate because: Need for pressors and IV medications.   Consultants:  GI   Procedures:  None    Antimicrobials:  Anti-infectives (From admission, onward)    Start     Dose/Rate Route Frequency Ordered Stop   12/24/21 1600  metroNIDAZOLE (FLAGYL) IVPB 500 mg  Status:  Discontinued        500 mg 100 mL/hr over 60 Minutes Intravenous Every 12 hours 12/24/21 0729 12/24/21 1141   12/24/21 1230  vancomycin (VANCOCIN) capsule 500 mg        500 mg Oral Every 6 hours 12/24/21 1141 01/07/22 1159   12/24/21 1230  metroNIDAZOLE (FLAGYL) IVPB 500 mg        500 mg 100 mL/hr over 60 Minutes Intravenous Every 8 hours 12/24/21 1141 01/07/22 1229   12/24/21  0400  ceFEPIme (MAXIPIME) 2 g in sodium chloride 0.9 % 100 mL IVPB  Status:  Discontinued        2 g 200 mL/hr over 30 Minutes Intravenous Every 12 hours 12/23/21 1535 12/23/21 2224   12/24/21 0400  cefTRIAXone (ROCEPHIN) 2 g in sodium chloride 0.9 % 100 mL IVPB        2  g 200 mL/hr over 30 Minutes Intravenous Every 24 hours 12/23/21 2224     12/24/21 0400  metroNIDAZOLE (FLAGYL) IVPB 500 mg        500 mg 100 mL/hr over 60 Minutes Intravenous  Once 12/23/21 2224 12/24/21 0430   12/23/21 1545  ceFEPIme (MAXIPIME) 2 g in sodium chloride 0.9 % 100 mL IVPB        2 g 200 mL/hr over 30 Minutes Intravenous  Once 12/23/21 1530 12/23/21 1615   12/23/21 1530  aztreonam (AZACTAM) 2 g in sodium chloride 0.9 % 100 mL IVPB  Status:  Discontinued        2 g 200 mL/hr over 30 Minutes Intravenous  Once 12/23/21 1523 12/23/21 1530   12/23/21 1530  metroNIDAZOLE (FLAGYL) IVPB 500 mg        500 mg 100 mL/hr over 60 Minutes Intravenous  Once 12/23/21 1523 12/23/21 1630       Subjective: Patient seen and evaluated today with improvement in confusion noted with ongoing watery diarrhea.  She continues to have abdominal pain.  Objective: Vitals:   12/25/21 1200 12/25/21 1237 12/25/21 1300 12/25/21 1330  BP: (!) 100/48 (!) 100/58 (!) 101/46 102/60  Pulse: (!) 102  (!) 102 (!) 101  Resp: 17 15 15 19   Temp:      TempSrc:      SpO2: 95%  99% 100%  Weight:      Height:        Intake/Output Summary (Last 24 hours) at 12/25/2021 1413 Last data filed at 12/25/2021 1300 Gross per 24 hour  Intake 2559.03 ml  Output 2725 ml  Net -165.97 ml   Filed Weights   12/23/21 2250 12/24/21 0303 12/25/21 0300  Weight: 53.7 kg 55 kg 57.5 kg    Examination:  General exam: Appears calm and comfortable  Respiratory system: Clear to auscultation. Respiratory effort normal. Cardiovascular system: S1 & S2 heard, RRR.  Gastrointestinal system: Abdomen is soft, Flexi-Seal noted with liquidy stool Central nervous system: Alert and awake Extremities: No edema Skin: No significant lesions noted Psychiatry: Flat affect. PICC line present to right upper extremity Foley with clear, yellow urine output   Data Reviewed: I have personally reviewed following labs and imaging  studies  CBC: Recent Labs  Lab 12/23/21 1524 12/23/21 1533 12/23/21 2343 12/24/21 0255 12/25/21 0600  WBC 24.2*  --   --  17.9* 20.0*  NEUTROABS 20.1*  --   --   --   --   HGB 11.1* 11.2* 16.1* 15.5* 11.3*  HCT 35.1* 33.0* 49.5* 46.2* 33.6*  MCV 102.0*  --   --  94.9 93.9  PLT 425*  --   --  440* 384   Basic Metabolic Panel: Recent Labs  Lab 12/23/21 1524 12/23/21 1533 12/24/21 0255 12/25/21 0600  NA 137 136 133* 127*  K 4.2 4.2 4.5 4.5  CL 109 106 105 102  CO2 20*  --  19* 23  GLUCOSE 134* 135* 180* 148*  BUN 32* 28* 32* 18  CREATININE 1.54* 1.40* 1.05* 0.54  CALCIUM 7.5*  --  7.9* 6.9*  MG  --   --   --  2.3   GFR: Estimated Creatinine Clearance: 71 mL/min (by C-G formula based on SCr of 0.54 mg/dL). Liver Function Tests: Recent Labs  Lab 12/23/21 1524 12/25/21 0600  AST 21 34  ALT 12 14  ALKPHOS 150* 119  BILITOT 0.6 0.6  PROT 4.3* 4.2*  ALBUMIN 1.9* 1.7*   No results for input(s): "LIPASE", "AMYLASE" in the last 168 hours. No results for input(s): "AMMONIA" in the last 168 hours. Coagulation Profile: Recent Labs  Lab 12/23/21 1527  INR 1.2   Cardiac Enzymes: Recent Labs  Lab 12/23/21 2343  CKTOTAL 23*   BNP (last 3 results) No results for input(s): "PROBNP" in the last 8760 hours. HbA1C: Recent Labs    12/23/21 1529  HGBA1C 4.7*   CBG: Recent Labs  Lab 12/24/21 0646 12/24/21 2327 12/25/21 0359 12/25/21 1141  GLUCAP 177* 148* 145* 106*   Lipid Profile: No results for input(s): "CHOL", "HDL", "LDLCALC", "TRIG", "CHOLHDL", "LDLDIRECT" in the last 72 hours. Thyroid Function Tests: No results for input(s): "TSH", "T4TOTAL", "FREET4", "T3FREE", "THYROIDAB" in the last 72 hours. Anemia Panel: Recent Labs    12/25/21 0600  VITAMINB12 142*  FOLATE 6.6   Sepsis Labs: Recent Labs  Lab 12/23/21 1527 12/23/21 1721 12/25/21 0600  LATICACIDVEN 5.9* 2.8* 1.2    Recent Results (from the past 240 hour(s))  Gastrointestinal Panel by  PCR , Stool     Status: Abnormal   Collection Time: 12/23/21 12:37 AM   Specimen: Stool  Result Value Ref Range Status   Campylobacter species NOT DETECTED NOT DETECTED Final   Plesimonas shigelloides NOT DETECTED NOT DETECTED Final   Salmonella species DETECTED (A) NOT DETECTED Final    Comment: RESULT CALLED TO, READ BACK BY AND VERIFIED WITH: C/CODY [facility] 12/24/21 1927    Yersinia enterocolitica NOT DETECTED NOT DETECTED Final   Vibrio species NOT DETECTED NOT DETECTED Final   Vibrio cholerae NOT DETECTED NOT DETECTED Final   Enteroaggregative E coli (EAEC) NOT DETECTED NOT DETECTED Final   Enteropathogenic E coli (EPEC) NOT DETECTED NOT DETECTED Final   Enterotoxigenic E coli (ETEC) NOT DETECTED NOT DETECTED Final   Shiga like toxin producing E coli (STEC) NOT DETECTED NOT DETECTED Final   Shigella/Enteroinvasive E coli (EIEC) NOT DETECTED NOT DETECTED Final   Cryptosporidium NOT DETECTED NOT DETECTED Final   Cyclospora cayetanensis NOT DETECTED NOT DETECTED Final   Entamoeba histolytica NOT DETECTED NOT DETECTED Final   Giardia lamblia NOT DETECTED NOT DETECTED Final   Adenovirus F40/41 NOT DETECTED NOT DETECTED Final   Astrovirus NOT DETECTED NOT DETECTED Final   Norovirus GI/GII NOT DETECTED NOT DETECTED Final   Rotavirus A NOT DETECTED NOT DETECTED Final   Sapovirus (I, II, IV, and V) NOT DETECTED NOT DETECTED Final    Comment: Performed at [facility], [address] [place], [place] [address]  Blood Culture (routine x 2)     Status: None (Preliminary result)   Collection Time: 12/23/21  3:27 PM   Specimen: Leg; Blood  Result Value Ref Range Status   Specimen Description LEG  Final   Special Requests   Final    BOTTLES DRAWN AEROBIC AND ANAEROBIC Blood Culture results may not be optimal due to an excessive volume of blood received in culture bottles   Culture   Final    NO GROWTH 2 DAYS Performed at [facility], [address] [place], [place] [address]     Report  Status PENDING  Incomplete  Resp Panel by RT-PCR (Flu A&B, Covid) Anterior Nasal Swab     Status: None   Collection Time: 12/23/21  3:45 PM   Specimen: Anterior Nasal Swab  Result Value Ref Range Status   SARS Coronavirus 2 by RT PCR NEGATIVE NEGATIVE Final    Comment: (NOTE) SARS-CoV-2 target nucleic acids are NOT DETECTED.  The SARS-CoV-2 RNA is generally detectable in upper respiratory specimens during the acute phase of infection. The lowest concentration of SARS-CoV-2 viral copies this assay can detect is 138 copies/mL. A negative result does not preclude SARS-Cov-2 infection and should not be used as the sole basis for treatment or other patient management decisions. A negative result may occur with  improper specimen collection/handling, submission of specimen other than nasopharyngeal swab, presence of viral mutation(s) within the areas targeted by this assay, and inadequate number of viral copies(<138 copies/mL). A negative result must be combined with clinical observations, patient history, and epidemiological information. The expected result is Negative.  Fact Sheet for Patients:  EntrepreneurPulse.com.au  Fact Sheet for Healthcare Providers:  IncredibleEmployment.be  This test is no t yet approved or cleared by the [place] FDA and  has been authorized for detection and/or diagnosis of SARS-CoV-2 by FDA under an Emergency Use Authorization (EUA). This EUA will remain  in effect (meaning this test can be used) for the duration of the COVID-19 declaration under Section 564(b)(1) of the Act, 21 U.S.C.section 360bbb-3(b)(1), unless the authorization is terminated  or revoked sooner.       Influenza A by PCR NEGATIVE NEGATIVE Final   Influenza B by PCR NEGATIVE NEGATIVE Final    Comment: (NOTE) The Xpert Xpress SARS-CoV-2/FLU/RSV plus assay is intended as an aid in the diagnosis of influenza from Nasopharyngeal swab  specimens and should not be used as a sole basis for treatment. Nasal washings and aspirates are unacceptable for Xpert Xpress SARS-CoV-2/FLU/RSV testing.  Fact Sheet for Patients: EntrepreneurPulse.com.au  Fact Sheet for Healthcare Providers: IncredibleEmployment.be  This test is not yet approved or cleared by the [place] FDA and has been authorized for detection and/or diagnosis of SARS-CoV-2 by FDA under an Emergency Use Authorization (EUA). This EUA will remain in effect (meaning this test can be used) for the duration of the COVID-19 declaration under Section 564(b)(1) of the Act, 21 U.S.C. section 360bbb-3(b)(1), unless the authorization is terminated or revoked.  Performed at [facility], [address], [place], [place] [address]   Urine Culture     Status: Abnormal (Preliminary result)   Collection Time: 12/23/21  4:41 PM   Specimen: In/Out Cath Urine  Result Value Ref Range Status   Specimen Description   Final    IN/OUT CATH URINE Performed at [facility], [address], [place], [place] [address]    Special Requests   Final    NONE Performed at [facility], [address], [place], [place] [address]    Culture (A)  Final    >=100,000 COLONIES/mL ESCHERICHIA COLI CONFIRMATION OF SUSCEPTIBILITIES IN PROGRESS Performed at [place] [address], [place],  [address]    Report Status PENDING  Incomplete  MRSA Next Gen by PCR, Nasal     Status: None   Collection Time: 12/23/21 11:01 PM   Specimen: Nasal Mucosa; Nasal Swab  Result Value Ref Range Status   MRSA by PCR Next Gen NOT DETECTED NOT DETECTED Final    Comment: (NOTE) The GeneXpert MRSA Assay (FDA approved for NASAL  specimens only), is one component of a comprehensive MRSA colonization surveillance program. It is not intended to diagnose MRSA infection nor to guide or monitor treatment for MRSA infections. Test performance is not FDA approved in  patients less than 81 years old. Performed at [facility], [address], [place], [place] [address]   C Difficile Quick Screen w PCR reflex     Status: Abnormal   Collection Time: 12/24/21 12:24 AM   Specimen: Stool  Result Value Ref Range Status   C Diff antigen POSITIVE (A) NEGATIVE Final   C Diff toxin NEGATIVE NEGATIVE Final   C Diff interpretation Results are indeterminate. See PCR results.  Final    Comment: Performed at [facility], [address] [place], [place] [address]  C. Diff by PCR, Reflexed     Status: Abnormal   Collection Time: 12/24/21 12:24 AM  Result Value Ref Range Status   Toxigenic C. Difficile by PCR POSITIVE (A) NEGATIVE Final    Comment: Positive for toxigenic C. difficile with little to no toxin production. Only treat if clinical presentation suggests symptomatic illness. Performed at [facility], [place] [address], [place], [place] [address]          Radiology Studies: [place] EKG SITE RITE  Result Date: 12/24/2021 If [facility] image not attached, placement could not be confirmed due to current cardiac rhythm.  CT HEAD WO CONTRAST (5MM)  Result Date: 12/23/2021 CLINICAL DATA:  Mental status change, unknown cause Falls, AMS, hypotension. EXAM: CT HEAD WITHOUT CONTRAST TECHNIQUE: Contiguous axial images were obtained from the base of the skull through the vertex without intravenous contrast. RADIATION DOSE REDUCTION: This exam was performed according to the departmental dose-optimization program which includes automated exposure control, adjustment of the mA and/or kV according to patient size and/or use of iterative reconstruction technique. COMPARISON:  None Available. FINDINGS: Brain: Normal anatomic configuration. No abnormal intra or extra-axial mass lesion or fluid collection. No abnormal mass effect or midline shift. No evidence of acute intracranial hemorrhage or infarct. Ventricular size is normal. Cerebellum unremarkable. Vascular: Unremarkable  Skull: Intact Sinuses/Orbits: Paranasal sinuses are clear. Orbits are unremarkable. Other: Mastoid air cells and middle ear cavities are clear. IMPRESSION: No acute intracranial abnormality. Electronically Signed   By: Fidela Salisbury M.D.   On: 12/23/2021 21:58   CT ABDOMEN PELVIS W CONTRAST  Result Date: 12/23/2021 CLINICAL DATA:  Abdominal pain and bloating for 1 week. Coffee-ground emesis. EXAM: CT ABDOMEN AND PELVIS WITH CONTRAST TECHNIQUE: Multidetector CT imaging of the abdomen and pelvis was performed using the standard protocol following bolus administration of intravenous contrast. RADIATION DOSE REDUCTION: This exam was performed according to the departmental dose-optimization program which includes automated exposure control, adjustment of the mA and/or kV according to patient size and/or use of iterative reconstruction technique. CONTRAST:  33m OMNIPAQUE IOHEXOL 300 MG/ML  SOLN COMPARISON:  10/24/2021 FINDINGS: Lower Chest: Stable small pericardial effusion. Hepatobiliary: No hepatic masses identified. Prior cholecystectomy. No evidence of biliary obstruction. Pancreas:  No mass or inflammatory changes. Spleen: Within normal limits in size and appearance. Adrenals/Urinary Tract: No masses identified. No evidence of ureteral calculi or hydronephrosis. Foley catheter is seen within the bladder. Stomach/Bowel: Air-fluid levels are seen throughout the colon which is mildly distended, consistent with ileus. Mild wall thickening is again seen involving the rectum and distal sigmoid colon with mild perirectal inflammatory changes, consistent with proctocolitis. No evidence of abscess or bowel obstruction. Vascular/Lymphatic: No pathologically enlarged lymph nodes. No acute vascular  findings. Right femoral central venous catheter in appropriate position. Reproductive: Prior hysterectomy noted. Adnexal regions are unremarkable in appearance. Other: Soft tissue gas noted in the right inguinal region and  abductor muscles of the thigh, likely related to the right femoral venous catheter placement. Musculoskeletal:  No suspicious bone lesions identified. IMPRESSION: Mild proctocolitis involving the rectosigmoid, without significant change. No evidence of abscess or other complication. Mild colonic ileus. Stable small pericardial effusion. Electronically Signed   By: Marlaine Hind M.D.   On: 12/23/2021 19:08   DG Chest Port 1 View  Result Date: 12/23/2021 CLINICAL DATA:  Found unresponsive. EXAM: PORTABLE CHEST 1 VIEW COMPARISON:  March 20, 2021 FINDINGS: The heart size and mediastinal contours are within normal limits. Both lungs are clear. The visualized skeletal structures are unremarkable. IMPRESSION: No active disease. Electronically Signed   By: Virgina Norfolk M.D.   On: 12/23/2021 17:18   DG Abdomen 1 View  Result Date: 12/23/2021 CLINICAL DATA:  Central line placement EXAM: ABDOMEN - 1 VIEW COMPARISON:  None Available. FINDINGS: Right femoral central line is in place with the tip in the expected location of the common iliac vein. Bowel gas pattern is unremarkable. IMPRESSION: Central line on the right at the expected location of the common iliac vein. Electronically Signed   By: Nelson Chimes M.D.   On: 12/23/2021 15:36        Scheduled Meds:  amitriptyline  100 mg Oral QHS   Chlorhexidine Gluconate Cloth  6 each Topical Daily   levETIRAcetam  750 mg Oral BID   methylPREDNISolone (SOLU-MEDROL) injection  60 mg Intravenous Daily   sodium chloride flush  10-40 mL Intracatheter Q12H   vancomycin  500 mg Oral Q6H   Continuous Infusions:  sodium chloride     sodium chloride     cefTRIAXone (ROCEPHIN)  IV Stopped (12/25/21 0409)   metronidazole 100 mL/hr at 12/25/21 1147   norepinephrine (LEVOPHED) Adult infusion 2 mcg/min (12/25/21 1147)   promethazine (PHENERGAN) injection (IM or IVPB) Stopped (12/24/21 0447)   vasopressin Stopped (12/25/21 0927)     LOS: 2 days    Time spent:  35 minutes    Lauren Aguayo D [name], DO [facility]  If 7PM-7AM, please contact night-coverage [email or web address] 12/25/2021, 2:13 PM

## 2021-12-25 NOTE — Progress Notes (Signed)
GI is requesting patient be transferred to [facility] for further treatment. Patient's demographic sheet faxed to [phone].

## 2021-12-25 NOTE — Progress Notes (Signed)
Gastroenterology Progress Note   Referring Provider: No ref. provider found Primary Care Physician:  Teodoro Kil, PA-C Primary Gastroenterologist:  Dr. Jenetta Downer Dr. Nyoka Cowden with [facility] [address]  Patient ID: [name]; [id number]; 1970/09/12    Subjective   Doing fairly well. Not needing any respiratory support. She continues to have abdominal pain mostly on the right side but reports improvement even with one dose of steroids. She is tolerating clear liquids well and would like to continue. Per nursing she is on low dose Levo and they have been actively trying to wean. She reports she tolerated her PICC placement well. No reports of blood in her stool or black stool.    Objective   Vital signs in last 24 hours Temp:  [98.2 F (36.8 C)-100.5 F (38.1 C)] 98.5 F (36.9 C) (08/25 1145) Pulse Rate:  [84-116] 102 (08/25 1200) Resp:  [13-34] 15 (08/25 1237) BP: (93-145)/(24-125) 100/58 (08/25 1237) SpO2:  [94 %-100 %] 95 % (08/25 1200) Weight:  [57.5 kg] 57.5 kg (08/25 0300) Last BM Date : 12/24/21  Physical Exam General:   Alert and oriented, pleasant Head:  Normocephalic and atraumatic. Eyes:  No icterus, sclera clear. Conjuctiva pink.  Mouth:  Without lesions, mucosa pink and moist.  Heart:  S1, S2 present, no murmurs noted.  Lungs: Clear to auscultation bilaterally, without wheezing, rales, or rhonchi.  Abdomen:  Bowel sounds present, non-distended. Tenderness to palpation in the RLQ. No HSM or hernias noted. No rebound or guarding. No masses appreciated  Msk:  Symmetrical without gross deformities. Normal posture. Extremities:  Without clubbing or edema. Neurologic:  Alert and  oriented x4;  grossly normal neurologically. Skin:  Warm and dry, intact without significant lesions.  Psych:  Alert and cooperative. Normal mood and affect.  Intake/Output from previous day: 08/24 0701 - 08/25 0700 In: 2872.3 [I.V.:1673.9; IV Piggyback:1198.4] Out: 1775 [Urine:975;  Stool:800] Intake/Output this shift: Total I/O In: 646.6 [P.O.:20; I.V.:396.1; IV Piggyback:230.5] Out: 275 [Urine:275]  Lab Results  Recent Labs    12/23/21 1524 12/23/21 1533 12/23/21 2343 12/24/21 0255 12/25/21 0600  WBC 24.2*  --   --  17.9* 20.0*  HGB 11.1*   < > 16.1* 15.5* 11.3*  HCT 35.1*   < > 49.5* 46.2* 33.6*  PLT 425*  --   --  440* 238   < > = values in this interval not displayed.   BMET Recent Labs    12/23/21 1524 12/23/21 1533 12/24/21 0255 12/25/21 0600  NA 137 136 133* 127*  K 4.2 4.2 4.5 4.5  CL 109 106 105 102  CO2 20*  --  19* 23  GLUCOSE 134* 135* 180* 148*  BUN 32* 28* 32* 18  CREATININE 1.54* 1.40* 1.05* 0.54  CALCIUM 7.5*  --  7.9* 6.9*   LFT Recent Labs    12/23/21 1524 12/25/21 0600  PROT 4.3* 4.2*  ALBUMIN 1.9* 1.7*  AST 21 34  ALT 12 14  ALKPHOS 150* 119  BILITOT 0.6 0.6   PT/INR Recent Labs    12/23/21 1527  LABPROT 14.9  INR 1.2   Hepatitis Panel No results for input(s): "HEPBSAG", "HCVAB", "HEPAIGM", "HEPBIGM" in the last 72 hours.   Studies/Results [place] EKG SITE RITE  Result Date: 12/24/2021 If [name] image not attached, placement could not be confirmed due to current cardiac rhythm.  CT HEAD WO CONTRAST (5MM)  Result Date: 12/23/2021 CLINICAL DATA:  Mental status change, unknown cause Falls, AMS, hypotension. EXAM: CT HEAD WITHOUT CONTRAST TECHNIQUE:  Contiguous axial images were obtained from the base of the skull through the vertex without intravenous contrast. RADIATION DOSE REDUCTION: This exam was performed according to the departmental dose-optimization program which includes automated exposure control, adjustment of the mA and/or kV according to patient size and/or use of iterative reconstruction technique. COMPARISON:  None Available. FINDINGS: Brain: Normal anatomic configuration. No abnormal intra or extra-axial mass lesion or fluid collection. No abnormal mass effect or midline shift. No evidence of acute  intracranial hemorrhage or infarct. Ventricular size is normal. Cerebellum unremarkable. Vascular: Unremarkable Skull: Intact Sinuses/Orbits: Paranasal sinuses are clear. Orbits are unremarkable. Other: Mastoid air cells and middle ear cavities are clear. IMPRESSION: No acute intracranial abnormality. Electronically Signed   By: Fidela Salisbury M.D.   On: 12/23/2021 21:58   CT ABDOMEN PELVIS W CONTRAST  Result Date: 12/23/2021 CLINICAL DATA:  Abdominal pain and bloating for 1 week. Coffee-ground emesis. EXAM: CT ABDOMEN AND PELVIS WITH CONTRAST TECHNIQUE: Multidetector CT imaging of the abdomen and pelvis was performed using the standard protocol following bolus administration of intravenous contrast. RADIATION DOSE REDUCTION: This exam was performed according to the departmental dose-optimization program which includes automated exposure control, adjustment of the mA and/or kV according to patient size and/or use of iterative reconstruction technique. CONTRAST:  65m OMNIPAQUE IOHEXOL 300 MG/ML  SOLN COMPARISON:  10/24/2021 FINDINGS: Lower Chest: Stable small pericardial effusion. Hepatobiliary: No hepatic masses identified. Prior cholecystectomy. No evidence of biliary obstruction. Pancreas:  No mass or inflammatory changes. Spleen: Within normal limits in size and appearance. Adrenals/Urinary Tract: No masses identified. No evidence of ureteral calculi or hydronephrosis. Foley catheter is seen within the bladder. Stomach/Bowel: Air-fluid levels are seen throughout the colon which is mildly distended, consistent with ileus. Mild wall thickening is again seen involving the rectum and distal sigmoid colon with mild perirectal inflammatory changes, consistent with proctocolitis. No evidence of abscess or bowel obstruction. Vascular/Lymphatic: No pathologically enlarged lymph nodes. No acute vascular findings. Right femoral central venous catheter in appropriate position. Reproductive: Prior hysterectomy noted.  Adnexal regions are unremarkable in appearance. Other: Soft tissue gas noted in the right inguinal region and abductor muscles of the thigh, likely related to the right femoral venous catheter placement. Musculoskeletal:  No suspicious bone lesions identified. IMPRESSION: Mild proctocolitis involving the rectosigmoid, without significant change. No evidence of abscess or other complication. Mild colonic ileus. Stable small pericardial effusion. Electronically Signed   By: JMarlaine HindM.D.   On: 12/23/2021 19:08   DG Chest Port 1 View  Result Date: 12/23/2021 CLINICAL DATA:  Found unresponsive. EXAM: PORTABLE CHEST 1 VIEW COMPARISON:  March 20, 2021 FINDINGS: The heart size and mediastinal contours are within normal limits. Both lungs are clear. The visualized skeletal structures are unremarkable. IMPRESSION: No active disease. Electronically Signed   By: TVirgina NorfolkM.D.   On: 12/23/2021 17:18   DG Abdomen 1 View  Result Date: 12/23/2021 CLINICAL DATA:  Central line placement EXAM: ABDOMEN - 1 VIEW COMPARISON:  None Available. FINDINGS: Right femoral central line is in place with the tip in the expected location of the common iliac vein. Bowel gas pattern is unremarkable. IMPRESSION: Central line on the right at the expected location of the common iliac vein. Electronically Signed   By: MNelson ChimesM.D.   On: 12/23/2021 15:36    Assessment  51y.o. female with a history of asthma, anxiety, PTSD, type 2 diabetes (not on medication), seizures, HTN, GERD, Chron's colitis with history of stricturing (diagnosed at  age 61) who presented to he ED via EMS after neighbor found patient unresponsive on bathroom floor. She was noted to have large volume stool with streaks of blood, hypotension, and hypothermia. GI consulted for management of Chron's.  Chron's colitis/diarrhea: Complex history as outlined in consult note. Allergic reactions to Remicade and Humira with plans to avoid anti-TNF agents. She  has had numerous hospitalizations this year at multiple facilities. She was referred to the IBD clinic at [facility] and seen Dr. Nyoka Cowden who is in the process of work-up to hopefully initiate Stelara vs surgical management but follow up has been difficult given her hospitalizations. She was most recently hospitalized at [name] with plans for possible follow up there but also has an appointment with [facility] in September. This has made adequate management difficult. She was previously on prednisone taper at home and treated for C-diff 2 motnhs ago while at [name] due to positive PCR. She had negative C diff antigen and toxin on July 19 and then positive PCR on August 11 likely representing colonization. Today she is C diff antigen +, toxin negative, and positive PCR with little to no toxin production.   She has mild proctocolitis involving the rectosigmoid and mild colonic ileus and worsening diarrhea since last week. She continues to have leukocytosis with bump in WBC from 17.9 to 20 today and could likely be due to steroids as well. She has been weaned off of vasopressin and remains on low dose Levophed currently to maintain BP. Solumedrol 60 mg IV initiated yesterday as well as oral vancomycin. Today she is tolerating a clear liquid diet with an improvement in her abdominal pain and diarrhea. Will continue supportive measures and monitor for ongoing improvement.   We have attempted transfer to [name] or [name] for multidisciplinary care with colorectal surgery and IBD specialists regarding her stricturing disease however she has not been accepted due to bed capacity. [facility] reported to reach back out Sunday/Monday and [name] requested to check back this afternoon.   Syncope: Patient reported history of recurrent syncopal episodes. Work-up per hospitalist. Vitamin B12 142, Folate 6.6. B1, B6, and Zinc labs pending. Suspected that mental status changes could be related to vitamin deficiency.   Plan / Recommendations   Supportive measures Continue oral vancomycin Continue solumedrol 60 mg IV daily Continue clear liquids. Continue to monitor stool output Awaiting transfer to [name] for multidisciplinary care, have currently declined her due to bed capacity and asked to be reached again on Sunday/Monday.     LOS: 2 days    12/25/2021, 1:26 PM   Venetia Night, MSN, FNP-BC, AGACNP-BC [name]

## 2021-12-26 DIAGNOSIS — R6520 Severe sepsis without septic shock: Secondary | ICD-10-CM | POA: Diagnosis not present

## 2021-12-26 DIAGNOSIS — A419 Sepsis, unspecified organism: Secondary | ICD-10-CM | POA: Diagnosis not present

## 2021-12-26 LAB — CBC
HCT: 31.7 % — ABNORMAL LOW (ref 36.0–46.0)
Hemoglobin: 10.5 g/dL — ABNORMAL LOW (ref 12.0–15.0)
MCH: 31.8 pg (ref 26.0–34.0)
MCHC: 33.1 g/dL (ref 30.0–36.0)
MCV: 96.1 fL (ref 80.0–100.0)
Platelets: 195 10*3/uL (ref 150–400)
RBC: 3.3 MIL/uL — ABNORMAL LOW (ref 3.87–5.11)
RDW: 14.7 % (ref 11.5–15.5)
WBC: 19 10*3/uL — ABNORMAL HIGH (ref 4.0–10.5)
nRBC: 0 % (ref 0.0–0.2)

## 2021-12-26 LAB — BASIC METABOLIC PANEL
Anion gap: 6 (ref 5–15)
BUN: 12 mg/dL (ref 6–20)
CO2: 23 mmol/L (ref 22–32)
Calcium: 8 mg/dL — ABNORMAL LOW (ref 8.9–10.3)
Chloride: 110 mmol/L (ref 98–111)
Creatinine, Ser: 0.53 mg/dL (ref 0.44–1.00)
GFR, Estimated: >60 mL/min (ref >60)
Glucose, Bld: 97 mg/dL (ref 70–99)
Potassium: 3.8 mmol/L (ref 3.5–5.1)
Sodium: 139 mmol/L (ref 135–145)

## 2021-12-26 LAB — URINE CULTURE: Culture: >=100000 — AB

## 2021-12-26 LAB — MAGNESIUM: Magnesium: 2.5 mg/dL — ABNORMAL HIGH (ref 1.7–2.4)

## 2021-12-26 MED ORDER — ALBUTEROL SULFATE (2.5 MG/3ML) 0.083% IN NEBU: 3.0000 mL | INHALATION_SOLUTION | RESPIRATORY_TRACT | Status: DC | PRN

## 2021-12-26 MED ORDER — SODIUM CHLORIDE 0.9 % IV SOLN
1.0000 g | Freq: Three times a day (TID) | INTRAVENOUS | Status: DC
  Administered 2021-12-26 – 2021-12-28 (×6): 1 g via INTRAVENOUS
  Filled 2021-12-26 (×6): qty 20

## 2021-12-26 MED ORDER — UMECLIDINIUM BROMIDE 62.5 MCG/ACT IN AEPB
1.0000 | INHALATION_SPRAY | Freq: Every day | RESPIRATORY_TRACT | Status: DC
  Administered 2021-12-27 – 2022-01-03 (×8): 1 via RESPIRATORY_TRACT
  Filled 2021-12-26 (×2): qty 7

## 2021-12-26 MED ORDER — TIOTROPIUM BROMIDE MONOHYDRATE 2.5 MCG/ACT IN AERS: 1.0000 | INHALATION_SPRAY | Freq: Every day | RESPIRATORY_TRACT | Status: DC

## 2021-12-26 MED ORDER — LACTATED RINGERS IV SOLN: INTRAVENOUS | Status: DC

## 2021-12-26 MED ORDER — ONDANSETRON 4 MG PO TBDP
ORAL_TABLET | ORAL | Status: AC
  Administered 2021-12-27: 4 mg
  Filled 2021-12-26: qty 1

## 2021-12-26 NOTE — Progress Notes (Signed)
Pt glucose 97 with 4 am labs, CBG not needed for 0500

## 2021-12-26 NOTE — Progress Notes (Signed)
PROGRESS NOTE    [name]  XHB:[id number] DOB: 05-16-1970 DOA: 12/23/2021 PCP: Teodoro Kil, PA-C   Brief Narrative:    [name] is a 51 y.o. female with medical history significant for Crohn's disease, hypertension, diabetes, seizures. Patient was found unresponsive at home. She reports chronic diarrhea, up to 10 times a day.  Reports intermittent vomiting-Chronic.  She has been admitted with severe sepsis, present on admission secondary to C. difficile colitis with associated acute metabolic encephalopathy as well as AKI.  She is also thought to have a UTI and is on multiple antibiotics.  GI consulted with recommendations to transfer to tertiary care center, but no beds are currently available at multiple facilities.  She currently appears to be improving and does not need transfer at the moment.  Assessment & Plan:   Principal Problem:   Severe sepsis ([place]) Active Problems:   Acute metabolic encephalopathy   Enterocolitis   AKI (acute kidney injury) ([place])   UTI (urinary tract infection)   COPD (chronic obstructive pulmonary disease) (HCC)   Crohn's disease ([place])   Diabetes mellitus without complication (HCC)   Lactic acidosis   Essential hypertension   MDD (major depressive disorder)   Seizure (HCC)   C. difficile colitis  Assessment and Plan:   Septic shock, present on admission, due to Cdiff colitis -Appreciate ongoing GI evaluation -Continue now on oral vancomycin as well as IV Flagyl -Continue to monitor closely -Advance diet to soft today   Chronic enterocolitis Diffuse abdominal tenderness, continues to have large-volume diarrhea.  History of Crohn's disease, not on treatment.  Reported blood in stools.  Hemoglobin stable. -History of Crohn's, appreciate GI evaluation with history of chronic diarrhea Continue Solu-Medrol as ordered for now   Hyponatremia-resolved Appears slightly hypervolemic Continue IV fluid Sh and osmolarity studies within  normal limits     Acute metabolic encephalopathy-resolved Found down and unresponsive.  Mental status has improved in the ED.  Was likely down for 2 days. -Head CT with no acute findings   E. coli UTI (urinary tract infection) UA suggestive of UTI with moderate leukocytes many bacteria. -IV Merrem started 8/26 due to significant resistance -Susceptibilities pending   AKI (acute kidney injury)-resolved Creatinine 1.58, baseline about 0.6-1.  Likely from dehydration, hypotension, sepsis. Continue IV fluid and monitor due to ongoing liquid stool output   Seizure (HCC) Resumed Keppra   Essential hypertension Initial hypotension systolic down to 81O.  Likely from no oral intake in the past 2 days, and sepsis. -Not on antihypertensives.   Diabetes mellitus without complication ([place]) Not on medication. - HgbA1c 4.7% -Monitor CBG daily   DVT prophylaxis:SCDs Code Status: Full Family Communication:Discussed with daughter [name] [phone] Disposition Plan:  Status is: Inpatient Remains inpatient appropriate because: Need for pressors and IV medications.   Consultants:  GI   Procedures:  None    Antimicrobials:  Anti-infectives (From admission, onward)    Start     Dose/Rate Route Frequency Ordered Stop   12/26/21 1100  meropenem (MERREM) 1 g in sodium chloride 0.9 % 100 mL IVPB        1 g 200 mL/hr over 30 Minutes Intravenous Every 8 hours 12/26/21 1013     12/24/21 1600  metroNIDAZOLE (FLAGYL) IVPB 500 mg  Status:  Discontinued        500 mg 100 mL/hr over 60 Minutes Intravenous Every 12 hours 12/24/21 0729 12/24/21 1141   12/24/21 1230  vancomycin (VANCOCIN) capsule 500 mg  500 mg Oral Every 6 hours 12/24/21 1141 01/07/22 1159   12/24/21 1230  metroNIDAZOLE (FLAGYL) IVPB 500 mg        500 mg 100 mL/hr over 60 Minutes Intravenous Every 8 hours 12/24/21 1141 01/07/22 1229   12/24/21 0400  ceFEPIme (MAXIPIME) 2 g in sodium chloride 0.9 % 100 mL IVPB  Status:   Discontinued        2 g 200 mL/hr over 30 Minutes Intravenous Every 12 hours 12/23/21 1535 12/23/21 2224   12/24/21 0400  cefTRIAXone (ROCEPHIN) 2 g in sodium chloride 0.9 % 100 mL IVPB  Status:  Discontinued        2 g 200 mL/hr over 30 Minutes Intravenous Every 24 hours 12/23/21 2224 12/26/21 1013   12/24/21 0400  metroNIDAZOLE (FLAGYL) IVPB 500 mg        500 mg 100 mL/hr over 60 Minutes Intravenous  Once 12/23/21 2224 12/24/21 0430   12/23/21 1545  ceFEPIme (MAXIPIME) 2 g in sodium chloride 0.9 % 100 mL IVPB        2 g 200 mL/hr over 30 Minutes Intravenous  Once 12/23/21 1530 12/23/21 1615   12/23/21 1530  aztreonam (AZACTAM) 2 g in sodium chloride 0.9 % 100 mL IVPB  Status:  Discontinued        2 g 200 mL/hr over 30 Minutes Intravenous  Once 12/23/21 1523 12/23/21 1530   12/23/21 1530  metroNIDAZOLE (FLAGYL) IVPB 500 mg        500 mg 100 mL/hr over 60 Minutes Intravenous  Once 12/23/21 1523 12/23/21 1630      Subjective: Patient seen and evaluated today with no new acute complaints or concerns. No acute concerns or events noted overnight.  She continues to have high stool output.  Blood pressures are improved.  Objective: Vitals:   12/26/21 1000 12/26/21 1025 12/26/21 1101 12/26/21 1200  BP: 107/71  106/62 (!) 117/50  Pulse: (!) 105 (!) 108  87  Resp: 14 15 15 19   Temp:   99 F (37.2 C)   TempSrc:   Oral   SpO2: 98% 92%  98%  Weight:      Height:        Intake/Output Summary (Last 24 hours) at 12/26/2021 1306 Last data filed at 12/26/2021 1306 Gross per 24 hour  Intake 1141.87 ml  Output 4650 ml  Net -3508.13 ml   Filed Weights   12/24/21 0303 12/25/21 0300 12/26/21 0500  Weight: 55 kg 57.5 kg 57.9 kg    Examination:  General exam: Appears calm and comfortable  Respiratory system: Clear to auscultation. Respiratory effort normal. Cardiovascular system: S1 & S2 heard, RRR.  Gastrointestinal system: Abdomen is soft Central nervous system: Alert and  awake Extremities: No edema Skin: No significant lesions noted Psychiatry: Flat affect. Rectal tube with liquidy stool output and Foley with clear, yellow urine output    Data Reviewed: I have personally reviewed following labs and imaging studies  CBC: Recent Labs  Lab 12/23/21 1524 12/23/21 1533 12/23/21 2343 12/24/21 0255 12/25/21 0600 12/26/21 0344  WBC 24.2*  --   --  17.9* 20.0* 19.0*  NEUTROABS 20.1*  --   --   --   --   --   HGB 11.1* 11.2* 16.1* 15.5* 11.3* 10.5*  HCT 35.1* 33.0* 49.5* 46.2* 33.6* 31.7*  MCV 102.0*  --   --  94.9 93.9 96.1  PLT 425*  --   --  440* 238 229   Basic Metabolic Panel:  Recent Labs  Lab 12/23/21 1524 12/23/21 1533 12/24/21 0255 12/25/21 0600 12/26/21 0344  NA 137 136 133* 127* 139  K 4.2 4.2 4.5 4.5 3.8  CL 109 106 105 102 110  CO2 20*  --  19* 23 23  GLUCOSE 134* 135* 180* 148* 97  BUN 32* 28* 32* 18 12  CREATININE 1.54* 1.40* 1.05* 0.54 0.53  CALCIUM 7.5*  --  7.9* 6.9* 8.0*  MG  --   --   --  2.3 2.5*   GFR: Estimated Creatinine Clearance: 71 mL/min (by C-G formula based on SCr of 0.53 mg/dL). Liver Function Tests: Recent Labs  Lab 12/23/21 1524 12/25/21 0600  AST 21 34  ALT 12 14  ALKPHOS 150* 119  BILITOT 0.6 0.6  PROT 4.3* 4.2*  ALBUMIN 1.9* 1.7*   No results for input(s): "LIPASE", "AMYLASE" in the last 168 hours. No results for input(s): "AMMONIA" in the last 168 hours. Coagulation Profile: Recent Labs  Lab 12/23/21 1527  INR 1.2   Cardiac Enzymes: Recent Labs  Lab 12/23/21 2343  CKTOTAL 23*   BNP (last 3 results) No results for input(s): "PROBNP" in the last 8760 hours. HbA1C: Recent Labs    12/23/21 1529  HGBA1C 4.7*   CBG: Recent Labs  Lab 12/24/21 0646 12/24/21 2327 12/25/21 0359 12/25/21 1141  GLUCAP 177* 148* 145* 106*   Lipid Profile: No results for input(s): "CHOL", "HDL", "LDLCALC", "TRIG", "CHOLHDL", "LDLDIRECT" in the last 72 hours. Thyroid Function Tests: Recent Labs     12/25/21 1455  TSH 0.832   Anemia Panel: Recent Labs    12/25/21 0600  VITAMINB12 142*  FOLATE 6.6   Sepsis Labs: Recent Labs  Lab 12/23/21 1527 12/23/21 1721 12/25/21 0600  LATICACIDVEN 5.9* 2.8* 1.2    Recent Results (from the past 240 hour(s))  Gastrointestinal Panel by PCR , Stool     Status: Abnormal   Collection Time: 12/23/21 12:37 AM   Specimen: Stool  Result Value Ref Range Status   Campylobacter species NOT DETECTED NOT DETECTED Final   Plesimonas shigelloides NOT DETECTED NOT DETECTED Final   Salmonella species DETECTED (A) NOT DETECTED Final    Comment: RESULT CALLED TO, READ BACK BY AND VERIFIED WITH: C/CODY [facility] 12/24/21 1927    Yersinia enterocolitica NOT DETECTED NOT DETECTED Final   Vibrio species NOT DETECTED NOT DETECTED Final   Vibrio cholerae NOT DETECTED NOT DETECTED Final   Enteroaggregative E coli (EAEC) NOT DETECTED NOT DETECTED Final   Enteropathogenic E coli (EPEC) NOT DETECTED NOT DETECTED Final   Enterotoxigenic E coli (ETEC) NOT DETECTED NOT DETECTED Final   Shiga like toxin producing E coli (STEC) NOT DETECTED NOT DETECTED Final   Shigella/Enteroinvasive E coli (EIEC) NOT DETECTED NOT DETECTED Final   Cryptosporidium NOT DETECTED NOT DETECTED Final   Cyclospora cayetanensis NOT DETECTED NOT DETECTED Final   Entamoeba histolytica NOT DETECTED NOT DETECTED Final   Giardia lamblia NOT DETECTED NOT DETECTED Final   Adenovirus F40/41 NOT DETECTED NOT DETECTED Final   Astrovirus NOT DETECTED NOT DETECTED Final   Norovirus GI/GII NOT DETECTED NOT DETECTED Final   Rotavirus A NOT DETECTED NOT DETECTED Final   Sapovirus (I, II, IV, and V) NOT DETECTED NOT DETECTED Final    Comment: Performed at [facility], [address] [place], [place] [address]  Blood Culture (routine x 2)     Status: None (Preliminary result)   Collection Time: 12/23/21  3:27 PM   Specimen: Leg; Blood  Result Value Ref Range Status   Specimen Description  LEG  Final   Special Requests   Final    BOTTLES DRAWN AEROBIC AND ANAEROBIC Blood Culture results may not be optimal due to an excessive volume of blood received in culture bottles   Culture   Final    NO GROWTH 2 DAYS Performed at [facility], [address], [place], [place] [address]    Report Status PENDING  Incomplete  Resp Panel by RT-PCR (Flu A&B, Covid) Anterior Nasal Swab     Status: None   Collection Time: 12/23/21  3:45 PM   Specimen: Anterior Nasal Swab  Result Value Ref Range Status   SARS Coronavirus 2 by RT PCR NEGATIVE NEGATIVE Final    Comment: (NOTE) SARS-CoV-2 target nucleic acids are NOT DETECTED.  The SARS-CoV-2 RNA is generally detectable in upper respiratory specimens during the acute phase of infection. The lowest concentration of SARS-CoV-2 viral copies this assay can detect is 138 copies/mL. A negative result does not preclude SARS-Cov-2 infection and should not be used as the sole basis for treatment or other patient management decisions. A negative result may occur with  improper specimen collection/handling, submission of specimen other than nasopharyngeal swab, presence of viral mutation(s) within the areas targeted by this assay, and inadequate number of viral copies(<138 copies/mL). A negative result must be combined with clinical observations, patient history, and epidemiological information. The expected result is Negative.  Fact Sheet for Patients:  EntrepreneurPulse.com.au  Fact Sheet for Healthcare Providers:  IncredibleEmployment.be  This test is no t yet approved or cleared by the [place] FDA and  has been authorized for detection and/or diagnosis of SARS-CoV-2 by FDA under an Emergency Use Authorization (EUA). This EUA will remain  in effect (meaning this test can be used) for the duration of the COVID-19 declaration under Section 564(b)(1) of the Act, 21 U.S.C.section 360bbb-3(b)(1), unless the  authorization is terminated  or revoked sooner.       Influenza A by PCR NEGATIVE NEGATIVE Final   Influenza B by PCR NEGATIVE NEGATIVE Final    Comment: (NOTE) The Xpert Xpress SARS-CoV-2/FLU/RSV plus assay is intended as an aid in the diagnosis of influenza from Nasopharyngeal swab specimens and should not be used as a sole basis for treatment. Nasal washings and aspirates are unacceptable for Xpert Xpress SARS-CoV-2/FLU/RSV testing.  Fact Sheet for Patients: EntrepreneurPulse.com.au  Fact Sheet for Healthcare Providers: IncredibleEmployment.be  This test is not yet approved or cleared by the [place] FDA and has been authorized for detection and/or diagnosis of SARS-CoV-2 by FDA under an Emergency Use Authorization (EUA). This EUA will remain in effect (meaning this test can be used) for the duration of the COVID-19 declaration under Section 564(b)(1) of the Act, 21 U.S.C. section 360bbb-3(b)(1), unless the authorization is terminated or revoked.  Performed at [facility], [address], [place], [place] [address]   Urine Culture     Status: Abnormal   Collection Time: 12/23/21  4:41 PM   Specimen: In/Out Cath Urine  Result Value Ref Range Status   Specimen Description   Final    IN/OUT CATH URINE Performed at [facility], [address], [place], [place] [address]    Special Requests   Final    NONE Performed at [facility], [address] [place], [place] [address]    Culture (A)  Final    >=100,000 COLONIES/mL ESCHERICHIA COLI Susceptibility Pattern Suggests Possibility of an Extended Spectrum Beta Lactamase Producer. Contact  Laboratory Within 7 Days if Confirmation Warranted. Performed at [facility], [place] [address], [place], [place] [address]    Report Status 12/26/2021 FINAL  Final   Organism ID, Bacteria ESCHERICHIA COLI (A)  Final      Susceptibility   Escherichia coli - MIC*    AMPICILLIN >=32 RESISTANT  Resistant     CEFAZOLIN >=64 RESISTANT Resistant     CEFEPIME 16 RESISTANT Resistant     CEFTRIAXONE >=64 RESISTANT Resistant     CIPROFLOXACIN >=4 RESISTANT Resistant     GENTAMICIN <=1 SENSITIVE Sensitive     IMIPENEM <=0.25 SENSITIVE Sensitive     NITROFURANTOIN <=16 SENSITIVE Sensitive     TRIMETH/SULFA <=20 SENSITIVE Sensitive     AMPICILLIN/SULBACTAM >=32 RESISTANT Resistant     PIP/TAZO 16 SENSITIVE Sensitive     * >=100,000 COLONIES/mL ESCHERICHIA COLI  MRSA Next Gen by PCR, Nasal     Status: None   Collection Time: 12/23/21 11:01 PM   Specimen: Nasal Mucosa; Nasal Swab  Result Value Ref Range Status   MRSA by PCR Next Gen NOT DETECTED NOT DETECTED Final    Comment: (NOTE) The GeneXpert MRSA Assay (FDA approved for NASAL specimens only), is one component of a comprehensive MRSA colonization surveillance program. It is not intended to diagnose MRSA infection nor to guide or monitor treatment for MRSA infections. Test performance is not FDA approved in patients less than 73 years old. Performed at [facility], [address] [place], [place] [address]   C Difficile Quick Screen w PCR reflex     Status: Abnormal   Collection Time: 12/24/21 12:24 AM   Specimen: Stool  Result Value Ref Range Status   C Diff antigen POSITIVE (A) NEGATIVE Final   C Diff toxin NEGATIVE NEGATIVE Final   C Diff interpretation Results are indeterminate. See PCR results.  Final    Comment: Performed at [facility], [address] [place], [place] [address]  C. Diff by PCR, Reflexed     Status: Abnormal   Collection Time: 12/24/21 12:24 AM  Result Value Ref Range Status   Toxigenic C. Difficile by PCR POSITIVE (A) NEGATIVE Final    Comment: Positive for toxigenic C. difficile with little to no toxin production. Only treat if clinical presentation suggests symptomatic illness. Performed at [facility], [place] [address] [place], [place] [address]          Radiology Studies: No results  found.      Scheduled Meds:  amitriptyline  100 mg Oral QHS   Chlorhexidine Gluconate Cloth  6 each Topical Daily   levETIRAcetam  750 mg Oral BID   methylPREDNISolone (SOLU-MEDROL) injection  60 mg Intravenous Daily   pantoprazole  40 mg Oral Daily   sodium chloride flush  10-40 mL Intracatheter Q12H   vancomycin  500 mg Oral Q6H   Continuous Infusions:  lactated ringers 75 mL/hr at 12/26/21 1246   meropenem (MERREM) IV 1 g (12/26/21 1200)   metronidazole 500 mg (12/26/21 1247)   norepinephrine (LEVOPHED) Adult infusion Stopped (12/25/21 1324)   promethazine (PHENERGAN) injection (IM or IVPB) Stopped (12/24/21 0447)   vasopressin Stopped (12/25/21 0927)     LOS: 3 days    Time spent: 35 minutes    Christine Bellot Darleen Crocker, DO [facility]  If 7PM-7AM, please contact night-coverage [email or web address] 12/26/2021, 1:06 PM

## 2021-12-26 NOTE — Progress Notes (Signed)
Patient states she feels much better.  Still having some abdominal pain.  Wants more to eat.  Per review of nursing staff, about 700 cc out of her Flexi-Seal over the past 24 hours.  No blood.  Vital signs in last 24 hours: Temp:  [97.8 F (36.6 C)-99 F (37.2 C)] 99 F (37.2 C) (08/26 1101) Pulse Rate:  [87-111] 87 (08/26 1200) Resp:  [13-24] 19 (08/26 1200) BP: (91-122)/(46-73) 117/50 (08/26 1200) SpO2:  [91 %-100 %] 98 % (08/26 1200) Weight:  [57.9 kg] 57.9 kg (08/26 0500) Last BM Date : 12/26/21 General:   Alert,  pleasant and cooperative in NAD Abdomen:  Nondistended.  Positive bowel sounds mild diffuse tenderness to palpation. Extremities:  Without clubbing or edema.    Intake/Output from previous day: 08/25 0701 - 08/26 0700 In: 1768.5 [P.O.:740; I.V.:428.3; IV Piggyback:600.2] Out: 6010 [Urine:4075; Stool:600] Intake/Output this shift: Total I/O In: 20 [I.V.:20] Out: 475 [Urine:475]  Lab Results: Recent Labs    12/24/21 0255 12/25/21 0600 12/26/21 0344  WBC 17.9* 20.0* 19.0*  HGB 15.5* 11.3* 10.5*  HCT 46.2* 33.6* 31.7*  PLT 440* 238 195   BMET Recent Labs    12/24/21 0255 12/25/21 0600 12/26/21 0344  NA 133* 127* 139  K 4.5 4.5 3.8  CL 105 102 110  CO2 19* 23 23  GLUCOSE 180* 148* 97  BUN 32* 18 12  CREATININE 1.05* 0.54 0.53  CALCIUM 7.9* 6.9* 8.0*   LFT Recent Labs    12/25/21 0600  PROT 4.2*  ALBUMIN 1.7*  AST 34  ALT 14  ALKPHOS 119  BILITOT 0.6   PT/INR Recent Labs    12/23/21 1527  LABPROT 14.9  INR 1.2   Hepatitis Panel No results for input(s): "HEPBSAG", "HCVAB", "HEPAIGM", "HEPBIGM" in the last 72 hours. 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  Fairly high suspicion for ongoing C. difficile superimposed on IBD.    E. coli UTI.  Based on  recent cross-sectional imaging,  No obstruction or impaction making spurious diarrhea much less likely.. Recent mucosal biopsies and stool studies negative for other infectious process including CMV.  Overall, much improved with ICU support, antibiotics, etc.  Recommendations:  Fully agree with aggressive therapy for C. difficile (  high vancomycin orally and IV Flagyl)  Continue corticosteroid therapy for now; consider transition to 40 mg orally daily in   the next 24 hours  Advance to soft, low residue diet.  Close interval outpatient GI follow-up with Dr. Jenetta Downer.    Outpatient tertiary help as needed.    Patient would be best served by a single GI provider who can oversee her care including tertiary referral when needed.   I discussed this recommendation at length with the patient today.     I have discussed my impression and recommendations with Dr. Michel Harrow       Further specific IBD directed therapy  should be orchestrated as an outpatient.

## 2021-12-27 DIAGNOSIS — A419 Sepsis, unspecified organism: Secondary | ICD-10-CM | POA: Diagnosis not present

## 2021-12-27 DIAGNOSIS — R6520 Severe sepsis without septic shock: Secondary | ICD-10-CM | POA: Diagnosis not present

## 2021-12-27 LAB — CBC
HCT: 30.5 % — ABNORMAL LOW (ref 36.0–46.0)
Hemoglobin: 9.8 g/dL — ABNORMAL LOW (ref 12.0–15.0)
MCH: 30.8 pg (ref 26.0–34.0)
MCHC: 32.1 g/dL (ref 30.0–36.0)
MCV: 95.9 fL (ref 80.0–100.0)
Platelets: 165 10*3/uL (ref 150–400)
RBC: 3.18 MIL/uL — ABNORMAL LOW (ref 3.87–5.11)
RDW: 14.2 % (ref 11.5–15.5)
WBC: 10.4 10*3/uL (ref 4.0–10.5)
nRBC: 0 % (ref 0.0–0.2)

## 2021-12-27 LAB — TYPE AND SCREEN
ABO/RH(D): B POS
Antibody Screen: NEGATIVE
Unit division: 0
Unit division: 0

## 2021-12-27 LAB — BPAM RBC
Blood Product Expiration Date: 202309222359
Blood Product Expiration Date: 202309222359
ISSUE DATE / TIME: 202308231946
Unit Type and Rh: 5100
Unit Type and Rh: 5100

## 2021-12-27 LAB — BASIC METABOLIC PANEL
Anion gap: 6 (ref 5–15)
BUN: 10 mg/dL (ref 6–20)
CO2: 23 mmol/L (ref 22–32)
Calcium: 8 mg/dL — ABNORMAL LOW (ref 8.9–10.3)
Chloride: 107 mmol/L (ref 98–111)
Creatinine, Ser: 0.5 mg/dL (ref 0.44–1.00)
GFR, Estimated: >60 mL/min (ref >60)
Glucose, Bld: 101 mg/dL — ABNORMAL HIGH (ref 70–99)
Potassium: 3.4 mmol/L — ABNORMAL LOW (ref 3.5–5.1)
Sodium: 136 mmol/L (ref 135–145)

## 2021-12-27 LAB — GLUCOSE, CAPILLARY: Glucose-Capillary: 86 mg/dL (ref 70–99)

## 2021-12-27 LAB — MAGNESIUM: Magnesium: 1.7 mg/dL (ref 1.7–2.4)

## 2021-12-27 MED ORDER — POTASSIUM CHLORIDE CRYS ER 20 MEQ PO TBCR
40.0000 meq | EXTENDED_RELEASE_TABLET | Freq: Once | ORAL | Status: AC
  Administered 2021-12-27: 40 meq via ORAL
  Filled 2021-12-27: qty 2

## 2021-12-27 NOTE — Progress Notes (Signed)
PROGRESS NOTE    [name]  OMB:[id number] DOB: [birth year] DOA: 12/23/2021 PCP: Christine Kil, PA-C   Brief Narrative:    [name] is a 51 y.o. female with medical history significant for Crohn's disease, hypertension, diabetes, seizures. Patient was found unresponsive at home. She reports chronic diarrhea, up to 10 times a day.  Reports intermittent vomiting-Chronic.  She has been admitted with severe sepsis, present on admission secondary to C. difficile colitis with associated acute metabolic encephalopathy as well as AKI.  She is also thought to have a UTI and is on multiple antibiotics.  GI consulted with recommendations to transfer to tertiary care center, but no beds are currently available at multiple facilities.  She currently appears to be improving and does not need transfer at the moment.  Assessment & Plan:   Principal Problem:   Severe sepsis ([place]) Active Problems:   Acute metabolic encephalopathy   Enterocolitis   AKI (acute kidney injury) ([place])   UTI (urinary tract infection)   COPD (chronic obstructive pulmonary disease) (HCC)   Crohn's disease ([place])   Diabetes mellitus without complication (HCC)   Lactic acidosis   Essential hypertension   MDD (major depressive disorder)   Seizure (HCC)   C. difficile colitis  Assessment and Plan:   Septic shock, present on admission, due to Cdiff colitis -Appreciate ongoing GI evaluation -Continue now on oral vancomycin as well as IV Flagyl -Continue to monitor closely -Back to liquid diet today   Chronic enterocolitis Diffuse abdominal tenderness, continues to have large-volume diarrhea.  History of Crohn's disease, not on treatment.  Reported blood in stools.  Hemoglobin stable. -History of Crohn's, appreciate GI evaluation with history of chronic diarrhea Continue Solu-Medrol as ordered for now   Mild hypokalemia Pleat and recheck in a.m.     Acute metabolic encephalopathy-resolved Found down and  unresponsive.  Mental status has improved in the ED.  Was likely down for 2 days. -Head CT with no acute findings   E. coli UTI (urinary tract infection) UA suggestive of UTI with moderate leukocytes many bacteria. -IV Merrem started 8/26 due to significant resistance -ESBL   AKI (acute kidney injury)-resolved Creatinine 1.58, baseline about 0.6-1.  Likely from dehydration, hypotension, sepsis. Continue IV fluid and monitor due to ongoing liquid stool output   Seizure (HCC) Resumed Keppra   Essential hypertension Initial hypotension systolic down to 45X.  Likely from no oral intake in the past 2 days, and sepsis. -Not on antihypertensives.   Diabetes mellitus without complication ([place]) Not on medication. - HgbA1c 4.7% -Monitor CBG daily   DVT prophylaxis:SCDs Code Status: Full Family Communication:Discussed with daughter [name] [phone] Disposition Plan:  Status is: Inpatient Remains inpatient appropriate because: Need for pressors and IV medications.   Consultants:  GI   Procedures:  None   Antimicrobials:  Anti-infectives (From admission, onward)    Start     Dose/Rate Route Frequency Ordered Stop   12/26/21 1100  meropenem (MERREM) 1 g in sodium chloride 0.9 % 100 mL IVPB        1 g 200 mL/hr over 30 Minutes Intravenous Every 8 hours 12/26/21 1013     12/24/21 1600  metroNIDAZOLE (FLAGYL) IVPB 500 mg  Status:  Discontinued        500 mg 100 mL/hr over 60 Minutes Intravenous Every 12 hours 12/24/21 0729 12/24/21 1141   12/24/21 1230  vancomycin (VANCOCIN) capsule 500 mg        500 mg Oral Every 6 hours 12/24/21  1141 01/07/22 1159   12/24/21 1230  metroNIDAZOLE (FLAGYL) IVPB 500 mg        500 mg 100 mL/hr over 60 Minutes Intravenous Every 8 hours 12/24/21 1141 01/07/22 1229   12/24/21 0400  ceFEPIme (MAXIPIME) 2 g in sodium chloride 0.9 % 100 mL IVPB  Status:  Discontinued        2 g 200 mL/hr over 30 Minutes Intravenous Every 12 hours 12/23/21 1535 12/23/21  2224   12/24/21 0400  cefTRIAXone (ROCEPHIN) 2 g in sodium chloride 0.9 % 100 mL IVPB  Status:  Discontinued        2 g 200 mL/hr over 30 Minutes Intravenous Every 24 hours 12/23/21 2224 12/26/21 1013   12/24/21 0400  metroNIDAZOLE (FLAGYL) IVPB 500 mg        500 mg 100 mL/hr over 60 Minutes Intravenous  Once 12/23/21 2224 12/24/21 0430   12/23/21 1545  ceFEPIme (MAXIPIME) 2 g in sodium chloride 0.9 % 100 mL IVPB        2 g 200 mL/hr over 30 Minutes Intravenous  Once 12/23/21 1530 12/23/21 1615   12/23/21 1530  aztreonam (AZACTAM) 2 g in sodium chloride 0.9 % 100 mL IVPB  Status:  Discontinued        2 g 200 mL/hr over 30 Minutes Intravenous  Once 12/23/21 1523 12/23/21 1530   12/23/21 1530  metroNIDAZOLE (FLAGYL) IVPB 500 mg        500 mg 100 mL/hr over 60 Minutes Intravenous  Once 12/23/21 1523 12/23/21 1630      Subjective: Patient seen and evaluated today with worsening abdominal pain and intolerance to soft diet yesterday.  She is stable for transfer to telemetry and continues to have liquidy stool output.  Objective: Vitals:   12/27/21 1000 12/27/21 1100 12/27/21 1129 12/27/21 1130  BP: 122/67 (!) 116/58  95/78  Pulse: 95     Resp: 18 18    Temp:   98 F (36.7 C)   TempSrc:   Oral   SpO2: 100%     Weight:      Height:        Intake/Output Summary (Last 24 hours) at 12/27/2021 1153 Last data filed at 12/27/2021 0737 Gross per 24 hour  Intake 2101.14 ml  Output 5275 ml  Net -3173.86 ml   Filed Weights   12/25/21 0300 12/26/21 0500 12/27/21 0500  Weight: 57.5 kg 57.9 kg 57.6 kg    Examination:  General exam: Appears calm and comfortable  Respiratory system: Clear to auscultation. Respiratory effort normal. Cardiovascular system: S1 & S2 heard, RRR.  Gastrointestinal system: Abdomen is tender to palpation Central nervous system: Alert and awake Extremities: No edema Skin: No significant lesions noted Psychiatry: Flat affect. Flexi-Seal with liquid stool  output Foley with clear, yellow urine output    Data Reviewed: I have personally reviewed following labs and imaging studies  CBC: Recent Labs  Lab 12/23/21 1524 12/23/21 1533 12/23/21 2343 12/24/21 0255 12/25/21 0600 12/26/21 0344 12/27/21 0414  WBC 24.2*  --   --  17.9* 20.0* 19.0* 10.4  NEUTROABS 20.1*  --   --   --   --   --   --   HGB 11.1*   < > 16.1* 15.5* 11.3* 10.5* 9.8*  HCT 35.1*   < > 49.5* 46.2* 33.6* 31.7* 30.5*  MCV 102.0*  --   --  94.9 93.9 96.1 95.9  PLT 425*  --   --  440* 238 195 165   < > =  values in this interval not displayed.   Basic Metabolic Panel: Recent Labs  Lab 12/23/21 1524 12/23/21 1533 12/24/21 0255 12/25/21 0600 12/26/21 0344 12/27/21 0414  NA 137 136 133* 127* 139 136  K 4.2 4.2 4.5 4.5 3.8 3.4*  CL 109 106 105 102 110 107  CO2 20*  --  19* 23 23 23   GLUCOSE 134* 135* 180* 148* 97 101*  BUN 32* 28* 32* 18 12 10   CREATININE 1.54* 1.40* 1.05* 0.54 0.53 0.50  CALCIUM 7.5*  --  7.9* 6.9* 8.0* 8.0*  MG  --   --   --  2.3 2.5* 1.7   GFR: Estimated Creatinine Clearance: 71 mL/min (by C-G formula based on SCr of 0.5 mg/dL). Liver Function Tests: Recent Labs  Lab 12/23/21 1524 12/25/21 0600  AST 21 34  ALT 12 14  ALKPHOS 150* 119  BILITOT 0.6 0.6  PROT 4.3* 4.2*  ALBUMIN 1.9* 1.7*   No results for input(s): "LIPASE", "AMYLASE" in the last 168 hours. No results for input(s): "AMMONIA" in the last 168 hours. Coagulation Profile: Recent Labs  Lab 12/23/21 1527  INR 1.2   Cardiac Enzymes: Recent Labs  Lab 12/23/21 2343  CKTOTAL 23*   BNP (last 3 results) No results for input(s): "PROBNP" in the last 8760 hours. HbA1C: No results for input(s): "HGBA1C" in the last 72 hours. CBG: Recent Labs  Lab 12/24/21 0646 12/24/21 2327 12/25/21 0359 12/25/21 1141 12/27/21 0433  GLUCAP 177* 148* 145* 106* 86   Lipid Profile: No results for input(s): "CHOL", "HDL", "LDLCALC", "TRIG", "CHOLHDL", "LDLDIRECT" in the last 72  hours. Thyroid Function Tests: Recent Labs    12/25/21 1455  TSH 0.832   Anemia Panel: Recent Labs    12/25/21 0600  VITAMINB12 142*  FOLATE 6.6   Sepsis Labs: Recent Labs  Lab 12/23/21 1527 12/23/21 1721 12/25/21 0600  LATICACIDVEN 5.9* 2.8* 1.2    Recent Results (from the past 240 hour(s))  Gastrointestinal Panel by PCR , Stool     Status: Abnormal   Collection Time: 12/23/21 12:37 AM   Specimen: Stool  Result Value Ref Range Status   Campylobacter species NOT DETECTED NOT DETECTED Final   Plesimonas shigelloides NOT DETECTED NOT DETECTED Final   Salmonella species DETECTED (A) NOT DETECTED Final    Comment: RESULT CALLED TO, READ BACK BY AND VERIFIED WITH: C/CODY [facility] 12/24/21 1927    Yersinia enterocolitica NOT DETECTED NOT DETECTED Final   Vibrio species NOT DETECTED NOT DETECTED Final   Vibrio cholerae NOT DETECTED NOT DETECTED Final   Enteroaggregative E coli (EAEC) NOT DETECTED NOT DETECTED Final   Enteropathogenic E coli (EPEC) NOT DETECTED NOT DETECTED Final   Enterotoxigenic E coli (ETEC) NOT DETECTED NOT DETECTED Final   Shiga like toxin producing E coli (STEC) NOT DETECTED NOT DETECTED Final   Shigella/Enteroinvasive E coli (EIEC) NOT DETECTED NOT DETECTED Final   Cryptosporidium NOT DETECTED NOT DETECTED Final   Cyclospora cayetanensis NOT DETECTED NOT DETECTED Final   Entamoeba histolytica NOT DETECTED NOT DETECTED Final   Giardia lamblia NOT DETECTED NOT DETECTED Final   Adenovirus F40/41 NOT DETECTED NOT DETECTED Final   Astrovirus NOT DETECTED NOT DETECTED Final   Norovirus GI/GII NOT DETECTED NOT DETECTED Final   Rotavirus A NOT DETECTED NOT DETECTED Final   Sapovirus (I, II, IV, and V) NOT DETECTED NOT DETECTED Final    Comment: Performed at [facility], [address], [place], [place] [address]  Blood Culture (routine x  2)     Status: None (Preliminary result)   Collection Time: 12/23/21  3:27 PM   Specimen: Leg; Blood  Result  Value Ref Range Status   Specimen Description LEG  Final   Special Requests   Final    BOTTLES DRAWN AEROBIC AND ANAEROBIC Blood Culture results may not be optimal due to an excessive volume of blood received in culture bottles   Culture   Final    NO GROWTH 3 DAYS Performed at [facility], [address], [place], [place] [address]    Report Status PENDING  Incomplete  Resp Panel by RT-PCR (Flu A&B, Covid) Anterior Nasal Swab     Status: None   Collection Time: 12/23/21  3:45 PM   Specimen: Anterior Nasal Swab  Result Value Ref Range Status   SARS Coronavirus 2 by RT PCR NEGATIVE NEGATIVE Final    Comment: (NOTE) SARS-CoV-2 target nucleic acids are NOT DETECTED.  The SARS-CoV-2 RNA is generally detectable in upper respiratory specimens during the acute phase of infection. The lowest concentration of SARS-CoV-2 viral copies this assay can detect is 138 copies/mL. A negative result does not preclude SARS-Cov-2 infection and should not be used as the sole basis for treatment or other patient management decisions. A negative result may occur with  improper specimen collection/handling, submission of specimen other than nasopharyngeal swab, presence of viral mutation(s) within the areas targeted by this assay, and inadequate number of viral copies(<138 copies/mL). A negative result must be combined with clinical observations, patient history, and epidemiological information. The expected result is Negative.  Fact Sheet for Patients:  EntrepreneurPulse.com.au  Fact Sheet for Healthcare Providers:  IncredibleEmployment.be  This test is no t yet approved or cleared by the [place] FDA and  has been authorized for detection and/or diagnosis of SARS-CoV-2 by FDA under an Emergency Use Authorization (EUA). This EUA will remain  in effect (meaning this test can be used) for the duration of the COVID-19 declaration under Section 564(b)(1) of the  Act, 21 U.S.C.section 360bbb-3(b)(1), unless the authorization is terminated  or revoked sooner.       Influenza A by PCR NEGATIVE NEGATIVE Final   Influenza B by PCR NEGATIVE NEGATIVE Final    Comment: (NOTE) The Xpert Xpress SARS-CoV-2/FLU/RSV plus assay is intended as an aid in the diagnosis of influenza from Nasopharyngeal swab specimens and should not be used as a sole basis for treatment. Nasal washings and aspirates are unacceptable for Xpert Xpress SARS-CoV-2/FLU/RSV testing.  Fact Sheet for Patients: EntrepreneurPulse.com.au  Fact Sheet for Healthcare Providers: IncredibleEmployment.be  This test is not yet approved or cleared by the [place] FDA and has been authorized for detection and/or diagnosis of SARS-CoV-2 by FDA under an Emergency Use Authorization (EUA). This EUA will remain in effect (meaning this test can be used) for the duration of the COVID-19 declaration under Section 564(b)(1) of the Act, 21 U.S.C. section 360bbb-3(b)(1), unless the authorization is terminated or revoked.  Performed at [facility], [address], [place], [place] [address]   Urine Culture     Status: Abnormal   Collection Time: 12/23/21  4:41 PM   Specimen: In/Out Cath Urine  Result Value Ref Range Status   Specimen Description   Final    IN/OUT CATH URINE Performed at [facility], [address] [place], [place] [address]    Special Requests   Final    NONE Performed at [facility], [address], [place], [place] [address]  Culture (A)  Final    >=100,000 COLONIES/mL ESCHERICHIA COLI Susceptibility Pattern Suggests Possibility of an Extended Spectrum Beta Lactamase Producer. Contact Laboratory Within 7 Days if Confirmation Warranted. Performed at [facility], [place] [address], [place], [place] [address]    Report Status 12/26/2021 FINAL  Final   Organism ID, Bacteria ESCHERICHIA COLI (A)  Final      Susceptibility    Escherichia coli - MIC*    AMPICILLIN >=32 RESISTANT Resistant     CEFAZOLIN >=64 RESISTANT Resistant     CEFEPIME 16 RESISTANT Resistant     CEFTRIAXONE >=64 RESISTANT Resistant     CIPROFLOXACIN >=4 RESISTANT Resistant     GENTAMICIN <=1 SENSITIVE Sensitive     IMIPENEM <=0.25 SENSITIVE Sensitive     NITROFURANTOIN <=16 SENSITIVE Sensitive     TRIMETH/SULFA <=20 SENSITIVE Sensitive     AMPICILLIN/SULBACTAM >=32 RESISTANT Resistant     PIP/TAZO 16 SENSITIVE Sensitive     * >=100,000 COLONIES/mL ESCHERICHIA COLI  MRSA Next Gen by PCR, Nasal     Status: None   Collection Time: 12/23/21 11:01 PM   Specimen: Nasal Mucosa; Nasal Swab  Result Value Ref Range Status   MRSA by PCR Next Gen NOT DETECTED NOT DETECTED Final    Comment: (NOTE) The GeneXpert MRSA Assay (FDA approved for NASAL specimens only), is one component of a comprehensive MRSA colonization surveillance program. It is not intended to diagnose MRSA infection nor to guide or monitor treatment for MRSA infections. Test performance is not FDA approved in patients less than 33 years old. Performed at [facility], [address] [place], [place] [address]   C Difficile Quick Screen w PCR reflex     Status: Abnormal   Collection Time: 12/24/21 12:24 AM   Specimen: Stool  Result Value Ref Range Status   C Diff antigen POSITIVE (A) NEGATIVE Final   C Diff toxin NEGATIVE NEGATIVE Final   C Diff interpretation Results are indeterminate. See PCR results.  Final    Comment: Performed at [facility], [address], [place], [place] [address]  C. Diff by PCR, Reflexed     Status: Abnormal   Collection Time: 12/24/21 12:24 AM  Result Value Ref Range Status   Toxigenic C. Difficile by PCR POSITIVE (A) NEGATIVE Final    Comment: Positive for toxigenic C. difficile with little to no toxin production. Only treat if clinical presentation suggests symptomatic illness. Performed at [facility], [place] [address], [place], [place]  [address]          Radiology Studies: No results found.      Scheduled Meds:  amitriptyline  100 mg Oral QHS   Chlorhexidine Gluconate Cloth  6 each Topical Daily   levETIRAcetam  750 mg Oral BID   methylPREDNISolone (SOLU-MEDROL) injection  60 mg Intravenous Daily   pantoprazole  40 mg Oral Daily   potassium chloride  40 mEq Oral Once   sodium chloride flush  10-40 mL Intracatheter Q12H   umeclidinium bromide  1 puff Inhalation Daily   vancomycin  500 mg Oral Q6H   Continuous Infusions:  lactated ringers 75 mL/hr at 12/27/21 0737   meropenem (MERREM) IV Stopped (12/27/21 0631)   metronidazole 500 mg (12/27/21 1147)   norepinephrine (LEVOPHED) Adult infusion Stopped (12/25/21 1324)   promethazine (PHENERGAN) injection (IM or IVPB) Stopped (12/24/21 0447)   vasopressin Stopped (12/25/21 0927)     LOS: 4 days    Time spent: 35 minutes  Grizel Vesely Darleen Crocker, DO [facility]  If 7PM-7AM, please contact night-coverage [email or web address] 12/27/2021, 11:53 AM

## 2021-12-27 NOTE — Progress Notes (Addendum)
Diet advanced yesterday.  Patient stated she started to feel nauseated and heaved a couple times overnight.  Not really take much on her tray.  Mild persisting generalized abdominal pain. According to nursing staff, she continues to have considerable's liquid stool output from her Flexi-Seal (400 cc yesterday)  Vital signs in last 24 hours: Temp:  [97.6 F (36.4 C)-99 F (37.2 C)] 98.4 F (36.9 C) (08/27 0730) Pulse Rate:  [87-118] 101 (08/27 0800) Resp:  [10-21] 10 (08/27 0800) BP: (105-132)/(50-79) 105/60 (08/27 0830) SpO2:  [91 %-100 %] 98 % (08/27 0800) Weight:  [57.6 kg] 57.6 kg (08/27 0500) Last BM Date : 12/27/21 General:   Alert, conversant.  Appears to be in no acute distress. Abdomen: Nondistended.  Positive bowel sounds.  Mild diffuse abdominal tenderness.  No rebound   Intake/Output from previous day: 08/26 0701 - 08/27 0700 In: 1899.9 [P.O.:360; I.V.:1039.9; IV Piggyback:500] Out: 5750 [Urine:5350; Stool:400] Intake/Output this shift: Total I/O In: 221.3 [I.V.:121.3; IV Piggyback:100] Out: -   Lab Results: Recent Labs    12/25/21 0600 12/26/21 0344 12/27/21 0414  WBC 20.0* 19.0* 10.4  HGB 11.3* 10.5* 9.8*  HCT 33.6* 31.7* 30.5*  PLT 238 195 165   BMET Recent Labs    12/25/21 0600 12/26/21 0344 12/27/21 0414  NA 127* 139 136  K 4.5 3.8 3.4*  CL 102 110 107  CO2 23 23 23   GLUCOSE 148* 97 101*  BUN 18 12 10   CREATININE 0.54 0.53 0.50  CALCIUM 6.9* 8.0* 8.0*   LFT Recent Labs    12/25/21 0600  PROT 4.2*  ALBUMIN 1.7*  AST 34  ALT 14  ALKPHOS 119  BILITOT 0.6    Impression: 51 year old lady admitted to the hospital with severe sepsis syndrome in the setting of probable C. difficile colitis, UTI and active fibro-stenosing Crohn's colitis. Clinically much improved over the past 72 hours.  White count is now normal.  No pressors. No evidence of megacolon or bowel obstruction on recent imaging. She did not tolerate diet advancement  yesterday.  Recommendations:  Continue oral vancomycin and IV Flagyl  Back her diet off to clear liquids today.  For now continue Solu-Medrol daily with plans to transition to prednisone 40 mg orally once she is doing well with oral intake.  Management of electrolytes per attending.  Might consider low-dose resin binding therapy to combat diarrhea -  but will hold off on that empiric treatment until good oral intake is reestablished.  Would consider fecal microbiota transplant once she is over her acute illness.  She will need a structured outpatient plan of GI management prior to discharge starting with Dr. Jenetta Downer.

## 2021-12-28 ENCOUNTER — Inpatient Hospital Stay (HOSPITAL_COMMUNITY): Payer: 59

## 2021-12-28 DIAGNOSIS — A419 Sepsis, unspecified organism: Secondary | ICD-10-CM | POA: Diagnosis not present

## 2021-12-28 DIAGNOSIS — A0472 Enterocolitis due to Clostridium difficile, not specified as recurrent: Secondary | ICD-10-CM | POA: Diagnosis not present

## 2021-12-28 DIAGNOSIS — R6520 Severe sepsis without septic shock: Secondary | ICD-10-CM | POA: Diagnosis not present

## 2021-12-28 DIAGNOSIS — R197 Diarrhea, unspecified: Secondary | ICD-10-CM | POA: Diagnosis not present

## 2021-12-28 DIAGNOSIS — K529 Noninfective gastroenteritis and colitis, unspecified: Secondary | ICD-10-CM | POA: Diagnosis not present

## 2021-12-28 LAB — CBC
HCT: 46.2 % — ABNORMAL HIGH (ref 36.0–46.0)
HCT: 29.1 % — ABNORMAL LOW (ref 36.0–46.0)
Hemoglobin: 15.5 g/dL — ABNORMAL HIGH (ref 12.0–15.0)
Hemoglobin: 9.5 g/dL — ABNORMAL LOW (ref 12.0–15.0)
MCH: 31.8 pg (ref 26.0–34.0)
MCH: 31.5 pg (ref 26.0–34.0)
MCHC: 33.5 g/dL (ref 30.0–36.0)
MCHC: 32.6 g/dL (ref 30.0–36.0)
MCV: 94.9 fL (ref 80.0–100.0)
MCV: 96.4 fL (ref 80.0–100.0)
Platelets: 440 10*3/uL — ABNORMAL HIGH (ref 150–400)
Platelets: 174 10*3/uL (ref 150–400)
RBC: 4.87 MIL/uL (ref 3.87–5.11)
RBC: 3.02 MIL/uL — ABNORMAL LOW (ref 3.87–5.11)
RDW: 15.1 % (ref 11.5–15.5)
RDW: 14 % (ref 11.5–15.5)
WBC: 17.9 10*3/uL — ABNORMAL HIGH (ref 4.0–10.5)
WBC: 9 10*3/uL (ref 4.0–10.5)
nRBC: 0 % (ref 0.0–0.2)
nRBC: 0 % (ref 0.0–0.2)

## 2021-12-28 LAB — BASIC METABOLIC PANEL
Anion gap: 4 — ABNORMAL LOW (ref 5–15)
BUN: 6 mg/dL (ref 6–20)
CO2: 27 mmol/L (ref 22–32)
Calcium: 8.1 mg/dL — ABNORMAL LOW (ref 8.9–10.3)
Chloride: 109 mmol/L (ref 98–111)
Creatinine, Ser: 0.44 mg/dL (ref 0.44–1.00)
GFR, Estimated: >60 mL/min (ref >60)
Glucose, Bld: 78 mg/dL (ref 70–99)
Potassium: 3.3 mmol/L — ABNORMAL LOW (ref 3.5–5.1)
Sodium: 140 mmol/L (ref 135–145)

## 2021-12-28 LAB — GLUCOSE, CAPILLARY: Glucose-Capillary: 79 mg/dL (ref 70–99)

## 2021-12-28 LAB — MAGNESIUM: Magnesium: 1.5 mg/dL — ABNORMAL LOW (ref 1.7–2.4)

## 2021-12-28 LAB — CULTURE, BLOOD (ROUTINE X 2): Culture: NO GROWTH

## 2021-12-28 LAB — VITAMIN B1: Vitamin B1 (Thiamine): 156.8 nmol/L (ref 66.5–200.0)

## 2021-12-28 MED ORDER — MAGNESIUM SULFATE 2 GM/50ML IV SOLN
2.0000 g | Freq: Once | INTRAVENOUS | Status: AC
Start: 2021-12-28 — End: 2021-12-28
  Administered 2021-12-28: 2 g via INTRAVENOUS
  Filled 2021-12-28: qty 50

## 2021-12-28 MED ORDER — ORAL CARE MOUTH RINSE: 15.0000 mL | OROMUCOSAL | Status: DC | PRN

## 2021-12-28 MED ORDER — IOHEXOL 300 MG/ML  SOLN
100.0000 mL | Freq: Once | INTRAMUSCULAR | Status: AC | PRN
  Administered 2021-12-28: 100 mL via INTRAVENOUS

## 2021-12-28 MED ORDER — IOHEXOL 9 MG/ML PO SOLN
ORAL | Status: AC
  Filled 2021-12-28: qty 1000

## 2021-12-28 MED ORDER — SULFAMETHOXAZOLE-TRIMETHOPRIM 800-160 MG PO TABS
1.0000 | ORAL_TABLET | Freq: Two times a day (BID) | ORAL | Status: AC
  Administered 2021-12-28 – 2022-01-01 (×10): 1 via ORAL
  Filled 2021-12-28 (×10): qty 1

## 2021-12-28 MED ORDER — POTASSIUM CHLORIDE CRYS ER 20 MEQ PO TBCR
40.0000 meq | EXTENDED_RELEASE_TABLET | Freq: Two times a day (BID) | ORAL | Status: DC
  Administered 2021-12-28 – 2022-01-01 (×10): 40 meq via ORAL
  Filled 2021-12-28 (×11): qty 2

## 2021-12-28 NOTE — Progress Notes (Addendum)
SLP Cancellation Note  Patient Details Name: [name] MRN: [id number] DOB: [birth year]   Cancelled treatment:       Reason Eval/Treat Not Completed: Medical issues which prohibited therapy. Pt consuming oral contrast and not able/appropriate to consume PO for further trials at this time, ST will continue efforts.  Raif Chachere H. Roddie Mc, CCC-SLP Speech Language Pathologist    Wende Bushy 12/28/2021, 4:21 PM

## 2021-12-28 NOTE — Progress Notes (Signed)
PROGRESS NOTE    [name]  RDE:[id number] DOB: 1970/12/01 DOA: 12/23/2021 PCP: Teodoro Kil, PA-C   Brief Narrative:  [name] is a 51 y.o. female with medical history significant for Crohn's disease, hypertension, diabetes, seizures. Patient was found unresponsive at home. She reports chronic diarrhea, up to 10 times a day.  Reports intermittent vomiting-Chronic.  She has been admitted with severe sepsis, present on admission secondary to C. difficile colitis with associated acute metabolic encephalopathy as well as AKI.  She is also thought to have a UTI and is on multiple antibiotics.  GI consulted with recommendations to transfer to tertiary care center, but no beds are currently available at multiple facilities.  She currently appears to be improving and does not need transfer at the moment.   Assessment & Plan:   Principal Problem:   Severe sepsis ([place]) Active Problems:   Acute metabolic encephalopathy   Enterocolitis   AKI (acute kidney injury) ([place])   UTI (urinary tract infection)   COPD (chronic obstructive pulmonary disease) (HCC)   Crohn's disease ([place])   Diabetes mellitus without complication (HCC)   Lactic acidosis   Essential hypertension   MDD (major depressive disorder)   Seizure (HCC)   C. difficile colitis  Assessment and Plan:   Septic shock, present on admission, due to Cdiff colitis -Appreciate ongoing GI evaluation with plan for KUB -Continue now on oral vancomycin as well as IV Flagyl -Continue to monitor closely -Clear liquid diet and advance as tolerated   Chronic enterocolitis Diffuse abdominal tenderness, continues to have large-volume diarrhea.  History of Crohn's disease, not on treatment.  Reported blood in stools.  Hemoglobin stable. -History of Crohn's, appreciate GI evaluation with history of chronic diarrhea Continue Solu-Medrol as ordered for now   Mild hypokalemia/hypomagnesemia Replete and recheck in a.m.   Acute  metabolic encephalopathy-resolved Found down and unresponsive.  Mental status has improved in the ED.  Was likely down for 2 days. -Head CT with no acute findings   E. coli UTI (urinary tract infection) Changed to Bactrim 8/28 -ESBL   AKI (acute kidney injury)-resolved Creatinine 1.58, baseline about 0.6-1.  Likely from dehydration, hypotension, sepsis. Continue IV fluid and monitor due to ongoing liquid stool output   Seizure (HCC) Resumed Keppra   Essential hypertension Initial hypotension systolic down to 63J.  Likely from no oral intake in the past 2 days, and sepsis. -Not on antihypertensives.   Diabetes mellitus without complication ([place]) Not on medication. - HgbA1c 4.7% -Monitor CBG daily   DVT prophylaxis:SCDs Code Status: Full Family Communication: Discussed with daughter Luetta Nutting [phone] Disposition Plan:  Status is: Inpatient Remains inpatient appropriate because: Need for pressors and IV medications.   Consultants:  GI   Procedures:  None    Antimicrobials:  Anti-infectives (From admission, onward)    Start     Dose/Rate Route Frequency Ordered Stop   12/28/21 1000  sulfamethoxazole-trimethoprim (BACTRIM DS) 800-160 MG per tablet 1 tablet        1 tablet Oral Every 12 hours 12/28/21 0844     12/26/21 1100  meropenem (MERREM) 1 g in sodium chloride 0.9 % 100 mL IVPB  Status:  Discontinued        1 g 200 mL/hr over 30 Minutes Intravenous Every 8 hours 12/26/21 1013 12/28/21 0844   12/24/21 1600  metroNIDAZOLE (FLAGYL) IVPB 500 mg  Status:  Discontinued        500 mg 100 mL/hr over 60 Minutes Intravenous Every 12 hours 12/24/21  4128 12/24/21 1141   12/24/21 1230  vancomycin (VANCOCIN) capsule 500 mg        500 mg Oral Every 6 hours 12/24/21 1141 01/07/22 1159   12/24/21 1230  metroNIDAZOLE (FLAGYL) IVPB 500 mg        500 mg 100 mL/hr over 60 Minutes Intravenous Every 8 hours 12/24/21 1141 01/07/22 1229   12/24/21 0400  ceFEPIme (MAXIPIME) 2 g in sodium  chloride 0.9 % 100 mL IVPB  Status:  Discontinued        2 g 200 mL/hr over 30 Minutes Intravenous Every 12 hours 12/23/21 1535 12/23/21 2224   12/24/21 0400  cefTRIAXone (ROCEPHIN) 2 g in sodium chloride 0.9 % 100 mL IVPB  Status:  Discontinued        2 g 200 mL/hr over 30 Minutes Intravenous Every 24 hours 12/23/21 2224 12/26/21 1013   12/24/21 0400  metroNIDAZOLE (FLAGYL) IVPB 500 mg        500 mg 100 mL/hr over 60 Minutes Intravenous  Once 12/23/21 2224 12/24/21 0430   12/23/21 1545  ceFEPIme (MAXIPIME) 2 g in sodium chloride 0.9 % 100 mL IVPB        2 g 200 mL/hr over 30 Minutes Intravenous  Once 12/23/21 1530 12/23/21 1615   12/23/21 1530  aztreonam (AZACTAM) 2 g in sodium chloride 0.9 % 100 mL IVPB  Status:  Discontinued        2 g 200 mL/hr over 30 Minutes Intravenous  Once 12/23/21 1523 12/23/21 1530   12/23/21 1530  metroNIDAZOLE (FLAGYL) IVPB 500 mg        500 mg 100 mL/hr over 60 Minutes Intravenous  Once 12/23/21 1523 12/23/21 1630       Subjective: Patient seen and evaluated today with some worsening abdominal pain noted this morning.  Bladder was distended and she will require replacement of Foley catheter.  Continues to have ongoing liquidy stool output.  Tolerating some clear liquids.  Objective: Vitals:   12/28/21 0200 12/28/21 0400 12/28/21 0600 12/28/21 0735  BP: 130/81 122/65 122/66   Pulse:      Resp: 15 (!) 21 16   Temp:      TempSrc:      SpO2:    100%  Weight:      Height:        Intake/Output Summary (Last 24 hours) at 12/28/2021 1137 Last data filed at 12/28/2021 0955 Gross per 24 hour  Intake 3176.3 ml  Output 3640 ml  Net -463.7 ml   Filed Weights   12/25/21 0300 12/26/21 0500 12/27/21 0500  Weight: 57.5 kg 57.9 kg 57.6 kg    Examination:  General exam: Appears calm and comfortable  Respiratory system: Clear to auscultation. Respiratory effort normal. Cardiovascular system: S1 & S2 heard, RRR.  Gastrointestinal system: Abdomen is  tender to palpation throughout, Flexi-Seal with liquid stool output Central nervous system: Alert and awake Extremities: No edema Skin: No significant lesions noted Psychiatry: Flat affect.    Data Reviewed: I have personally reviewed following labs and imaging studies  CBC: Recent Labs  Lab 12/23/21 1524 12/23/21 1533 12/24/21 0255 12/25/21 0600 12/26/21 0344 12/27/21 0414 12/28/21 0359  WBC 24.2*  --  17.9* 20.0* 19.0* 10.4 9.0  NEUTROABS 20.1*  --   --   --   --   --   --   HGB 11.1*   < > 15.5* 11.3* 10.5* 9.8* 9.5*  HCT 35.1*   < > 46.2* 33.6* 31.7* 30.5* 29.1*  MCV 102.0*  --  94.9 93.9 96.1 95.9 96.4  PLT 425*  --  440* 238 195 165 174   < > = values in this interval not displayed.   Basic Metabolic Panel: Recent Labs  Lab 12/24/21 0255 12/25/21 0600 12/26/21 0344 12/27/21 0414 12/28/21 0359  NA 133* 127* 139 136 140  K 4.5 4.5 3.8 3.4* 3.3*  CL 105 102 110 107 109  CO2 19* 23 23 23 27   GLUCOSE 180* 148* 97 101* 78  BUN 32* 18 12 10 6   CREATININE 1.05* 0.54 0.53 0.50 0.44  CALCIUM 7.9* 6.9* 8.0* 8.0* 8.1*  MG  --  2.3 2.5* 1.7 1.5*   GFR: Estimated Creatinine Clearance: 71 mL/min (by C-G formula based on SCr of 0.44 mg/dL). Liver Function Tests: Recent Labs  Lab 12/23/21 1524 12/25/21 0600  AST 21 34  ALT 12 14  ALKPHOS 150* 119  BILITOT 0.6 0.6  PROT 4.3* 4.2*  ALBUMIN 1.9* 1.7*   No results for input(s): "LIPASE", "AMYLASE" in the last 168 hours. No results for input(s): "AMMONIA" in the last 168 hours. Coagulation Profile: Recent Labs  Lab 12/23/21 1527  INR 1.2   Cardiac Enzymes: Recent Labs  Lab 12/23/21 2343  CKTOTAL 23*   BNP (last 3 results) No results for input(s): "PROBNP" in the last 8760 hours. HbA1C: No results for input(s): "HGBA1C" in the last 72 hours. CBG: Recent Labs  Lab 12/24/21 2327 12/25/21 0359 12/25/21 1141 12/27/21 0433 12/28/21 0659  GLUCAP 148* 145* 106* 86 79   Lipid Profile: No results for  input(s): "CHOL", "HDL", "LDLCALC", "TRIG", "CHOLHDL", "LDLDIRECT" in the last 72 hours. Thyroid Function Tests: Recent Labs    12/25/21 1455  TSH 0.832   Anemia Panel: No results for input(s): "VITAMINB12", "FOLATE", "FERRITIN", "TIBC", "IRON", "RETICCTPCT" in the last 72 hours. Sepsis Labs: Recent Labs  Lab 12/23/21 1527 12/23/21 1721 12/25/21 0600  LATICACIDVEN 5.9* 2.8* 1.2    Recent Results (from the past 240 hour(s))  Gastrointestinal Panel by PCR , Stool     Status: Abnormal   Collection Time: 12/23/21 12:37 AM   Specimen: Stool  Result Value Ref Range Status   Campylobacter species NOT DETECTED NOT DETECTED Final   Plesimonas shigelloides NOT DETECTED NOT DETECTED Final   Salmonella species DETECTED (A) NOT DETECTED Final    Comment: RESULT CALLED TO, READ BACK BY AND VERIFIED WITH: C/CODY [facility] 12/24/21 1927    Yersinia enterocolitica NOT DETECTED NOT DETECTED Final   Vibrio species NOT DETECTED NOT DETECTED Final   Vibrio cholerae NOT DETECTED NOT DETECTED Final   Enteroaggregative E coli (EAEC) NOT DETECTED NOT DETECTED Final   Enteropathogenic E coli (EPEC) NOT DETECTED NOT DETECTED Final   Enterotoxigenic E coli (ETEC) NOT DETECTED NOT DETECTED Final   Shiga like toxin producing E coli (STEC) NOT DETECTED NOT DETECTED Final   Shigella/Enteroinvasive E coli (EIEC) NOT DETECTED NOT DETECTED Final   Cryptosporidium NOT DETECTED NOT DETECTED Final   Cyclospora cayetanensis NOT DETECTED NOT DETECTED Final   Entamoeba histolytica NOT DETECTED NOT DETECTED Final   Giardia lamblia NOT DETECTED NOT DETECTED Final   Adenovirus F40/41 NOT DETECTED NOT DETECTED Final   Astrovirus NOT DETECTED NOT DETECTED Final   Norovirus GI/GII NOT DETECTED NOT DETECTED Final   Rotavirus A NOT DETECTED NOT DETECTED Final   Sapovirus (I, II, IV, and V) NOT DETECTED NOT DETECTED Final    Comment: Performed at [facility], [address] [place],  [place] [address]  Blood  Culture (routine x 2)     Status: None (Preliminary result)   Collection Time: 12/23/21  3:27 PM   Specimen: Leg; Blood  Result Value Ref Range Status   Specimen Description LEG  Final   Special Requests   Final    BOTTLES DRAWN AEROBIC AND ANAEROBIC Blood Culture results may not be optimal due to an excessive volume of blood received in culture bottles   Culture   Final    NO GROWTH 4 DAYS Performed at [facility], [address], [place], [place] [address]    Report Status PENDING  Incomplete  Resp Panel by RT-PCR (Flu A&B, Covid) Anterior Nasal Swab     Status: None   Collection Time: 12/23/21  3:45 PM   Specimen: Anterior Nasal Swab  Result Value Ref Range Status   SARS Coronavirus 2 by RT PCR NEGATIVE NEGATIVE Final    Comment: (NOTE) SARS-CoV-2 target nucleic acids are NOT DETECTED.  The SARS-CoV-2 RNA is generally detectable in upper respiratory specimens during the acute phase of infection. The lowest concentration of SARS-CoV-2 viral copies this assay can detect is 138 copies/mL. A negative result does not preclude SARS-Cov-2 infection and should not be used as the sole basis for treatment or other patient management decisions. A negative result may occur with  improper specimen collection/handling, submission of specimen other than nasopharyngeal swab, presence of viral mutation(s) within the areas targeted by this assay, and inadequate number of viral copies(<138 copies/mL). A negative result must be combined with clinical observations, patient history, and epidemiological information. The expected result is Negative.  Fact Sheet for Patients:  EntrepreneurPulse.com.au  Fact Sheet for Healthcare Providers:  IncredibleEmployment.be  This test is no t yet approved or cleared by the [place] FDA and  has been authorized for detection and/or diagnosis of SARS-CoV-2 by FDA under an Emergency Use Authorization (EUA). This EUA will  remain  in effect (meaning this test can be used) for the duration of the COVID-19 declaration under Section 564(b)(1) of the Act, 21 U.S.C.section 360bbb-3(b)(1), unless the authorization is terminated  or revoked sooner.       Influenza A by PCR NEGATIVE NEGATIVE Final   Influenza B by PCR NEGATIVE NEGATIVE Final    Comment: (NOTE) The Xpert Xpress SARS-CoV-2/FLU/RSV plus assay is intended as an aid in the diagnosis of influenza from Nasopharyngeal swab specimens and should not be used as a sole basis for treatment. Nasal washings and aspirates are unacceptable for Xpert Xpress SARS-CoV-2/FLU/RSV testing.  Fact Sheet for Patients: EntrepreneurPulse.com.au  Fact Sheet for Healthcare Providers: IncredibleEmployment.be  This test is not yet approved or cleared by the [place] FDA and has been authorized for detection and/or diagnosis of SARS-CoV-2 by FDA under an Emergency Use Authorization (EUA). This EUA will remain in effect (meaning this test can be used) for the duration of the COVID-19 declaration under Section 564(b)(1) of the Act, 21 U.S.C. section 360bbb-3(b)(1), unless the authorization is terminated or revoked.  Performed at [facility], [address] [place], [place] [address]   Urine Culture     Status: Abnormal   Collection Time: 12/23/21  4:41 PM   Specimen: In/Out Cath Urine  Result Value Ref Range Status   Specimen Description   Final    IN/OUT CATH URINE Performed at [facility], [address] [place], [place] [address]    Special Requests   Final    NONE Performed at [facility], [place]  [address], [place], [place] [address]    Culture (A)  Final    >=100,000 COLONIES/mL ESCHERICHIA COLI Susceptibility Pattern Suggests Possibility of an Extended Spectrum Beta Lactamase Producer. Contact Laboratory Within 7 Days if Confirmation Warranted. Performed at [facility], [place] [address], [place], [place]  [address]    Report Status 12/26/2021 FINAL  Final   Organism ID, Bacteria ESCHERICHIA COLI (A)  Final      Susceptibility   Escherichia coli - MIC*    AMPICILLIN >=32 RESISTANT Resistant     CEFAZOLIN >=64 RESISTANT Resistant     CEFEPIME 16 RESISTANT Resistant     CEFTRIAXONE >=64 RESISTANT Resistant     CIPROFLOXACIN >=4 RESISTANT Resistant     GENTAMICIN <=1 SENSITIVE Sensitive     IMIPENEM <=0.25 SENSITIVE Sensitive     NITROFURANTOIN <=16 SENSITIVE Sensitive     TRIMETH/SULFA <=20 SENSITIVE Sensitive     AMPICILLIN/SULBACTAM >=32 RESISTANT Resistant     PIP/TAZO 16 SENSITIVE Sensitive     * >=100,000 COLONIES/mL ESCHERICHIA COLI  MRSA Next Gen by PCR, Nasal     Status: None   Collection Time: 12/23/21 11:01 PM   Specimen: Nasal Mucosa; Nasal Swab  Result Value Ref Range Status   MRSA by PCR Next Gen NOT DETECTED NOT DETECTED Final    Comment: (NOTE) The GeneXpert MRSA Assay (FDA approved for NASAL specimens only), is one component of a comprehensive MRSA colonization surveillance program. It is not intended to diagnose MRSA infection nor to guide or monitor treatment for MRSA infections. Test performance is not FDA approved in patients less than 70 years old. Performed at [facility], [address] [place], [place] [address]   C Difficile Quick Screen w PCR reflex     Status: Abnormal   Collection Time: 12/24/21 12:24 AM   Specimen: Stool  Result Value Ref Range Status   C Diff antigen POSITIVE (A) NEGATIVE Final   C Diff toxin NEGATIVE NEGATIVE Final   C Diff interpretation Results are indeterminate. See PCR results.  Final    Comment: Performed at [facility], [address] [place] [address], [place], [place] [address]  C. Diff by PCR, Reflexed     Status: Abnormal   Collection Time: 12/24/21 12:24 AM  Result Value Ref Range Status   Toxigenic C. Difficile by PCR POSITIVE (A) NEGATIVE Final    Comment: Positive for toxigenic C. difficile with little to no toxin production. Only treat  if clinical presentation suggests symptomatic illness. Performed at [facility], [place] [address], [place],  [address]          Radiology Studies: No results found.      Scheduled Meds:  amitriptyline  100 mg Oral QHS   Chlorhexidine Gluconate Cloth  6 each Topical Daily   levETIRAcetam  750 mg Oral BID   methylPREDNISolone (SOLU-MEDROL) injection  60 mg Intravenous Daily   pantoprazole  40 mg Oral Daily   potassium chloride  40 mEq Oral BID   sodium chloride flush  10-40 mL Intracatheter Q12H   sulfamethoxazole-trimethoprim  1 tablet Oral Q12H   umeclidinium bromide  1 puff Inhalation Daily   vancomycin  500 mg Oral Q6H   Continuous Infusions:  lactated ringers 75 mL/hr at 12/28/21 0955   magnesium sulfate bolus IVPB     metronidazole 500 mg (12/28/21 0354)   norepinephrine (LEVOPHED) Adult infusion Stopped (12/25/21 1324)   promethazine (PHENERGAN) injection (IM or IVPB) Stopped (12/24/21 0447)   vasopressin Stopped (12/25/21 0927)  LOS: 5 days    Time spent: 35 minutes    Debar Plate Darleen Crocker, DO [facility]  If 7PM-7AM, please contact night-coverage [email or web address] 12/28/2021, 11:37 AM

## 2021-12-28 NOTE — Progress Notes (Signed)
Gastroenterology Progress Note     Patient ID: [name]; [id number]; 08-18-1970    Subjective   Notes abdominal pain RLQ. Intermittent nausea but no vomiting. Poor appetite. Feels abdomen is distended. +Flatus. 400 ml stool last filed dated 8/26 in evening. Unclear what past 24 output has been. Just over 400 ml in flexiseal currently.    Objective   Vital signs in last 24 hours Temp:  [97.6 F (36.4 C)-98.5 F (36.9 C)] 98.5 F (36.9 C) (08/27 2300) Resp:  [12-21] 16 (08/28 0600) BP: (94-130)/(55-81) 122/66 (08/28 0600) SpO2:  [100 %] 100 % (08/28 0735) Last BM Date : 12/27/21  Physical Exam General:   Alert and oriented, chronically ill-appearing Head:  Normocephalic and atraumatic. Abdomen:  Bowel sounds present, distended, TTP lower abdomen Extremities:  Without  edema. Neurologic:  Alert and  oriented x4   Intake/Output from previous day: 08/27 0701 - 08/28 0700 In: 3014.2 [P.O.:840; I.V.:1574.2; IV Piggyback:600] Out: 3536 [Urine:3640] Intake/Output this shift: Total I/O In: 383.3 [I.V.:283.3; IV Piggyback:100] Out: -   Lab Results  Recent Labs    12/26/21 0344 12/27/21 0414 12/28/21 0359  WBC 19.0* 10.4 9.0  HGB 10.5* 9.8* 9.5*  HCT 31.7* 30.5* 29.1*  PLT 195 165 174   BMET Recent Labs    12/26/21 0344 12/27/21 0414 12/28/21 0359  NA 139 136 140  K 3.8 3.4* 3.3*  CL 110 107 109  CO2 23 23 27   GLUCOSE 97 101* 78  BUN 12 10 6   CREATININE 0.53 0.50 0.44  CALCIUM 8.0* 8.0* 8.1*     Studies/Results [place] EKG SITE RITE  Result Date: 12/24/2021 If Site Rite image not attached, placement could not be confirmed due to current cardiac rhythm.  CT HEAD WO CONTRAST (5MM)  Result Date: 12/23/2021 CLINICAL DATA:  Mental status change, unknown cause Falls, AMS, hypotension. EXAM: CT HEAD WITHOUT CONTRAST TECHNIQUE: Contiguous axial images were obtained from the base of the skull through the vertex without intravenous contrast. RADIATION DOSE  REDUCTION: This exam was performed according to the departmental dose-optimization program which includes automated exposure control, adjustment of the mA and/or kV according to patient size and/or use of iterative reconstruction technique. COMPARISON:  None Available. FINDINGS: Brain: Normal anatomic configuration. No abnormal intra or extra-axial mass lesion or fluid collection. No abnormal mass effect or midline shift. No evidence of acute intracranial hemorrhage or infarct. Ventricular size is normal. Cerebellum unremarkable. Vascular: Unremarkable Skull: Intact Sinuses/Orbits: Paranasal sinuses are clear. Orbits are unremarkable. Other: Mastoid air cells and middle ear cavities are clear. IMPRESSION: No acute intracranial abnormality. Electronically Signed   By: Fidela Salisbury M.D.   On: 12/23/2021 21:58   CT ABDOMEN PELVIS W CONTRAST  Result Date: 12/23/2021 CLINICAL DATA:  Abdominal pain and bloating for 1 week. Coffee-ground emesis. EXAM: CT ABDOMEN AND PELVIS WITH CONTRAST TECHNIQUE: Multidetector CT imaging of the abdomen and pelvis was performed using the standard protocol following bolus administration of intravenous contrast. RADIATION DOSE REDUCTION: This exam was performed according to the departmental dose-optimization program which includes automated exposure control, adjustment of the mA and/or kV according to patient size and/or use of iterative reconstruction technique. CONTRAST:  51m OMNIPAQUE IOHEXOL 300 MG/ML  SOLN COMPARISON:  10/24/2021 FINDINGS: Lower Chest: Stable small pericardial effusion. Hepatobiliary: No hepatic masses identified. Prior cholecystectomy. No evidence of biliary obstruction. Pancreas:  No mass or inflammatory changes. Spleen: Within normal limits in size and appearance. Adrenals/Urinary Tract: No masses identified. No evidence of ureteral calculi or  hydronephrosis. Foley catheter is seen within the bladder. Stomach/Bowel: Air-fluid levels are seen throughout the  colon which is mildly distended, consistent with ileus. Mild wall thickening is again seen involving the rectum and distal sigmoid colon with mild perirectal inflammatory changes, consistent with proctocolitis. No evidence of abscess or bowel obstruction. Vascular/Lymphatic: No pathologically enlarged lymph nodes. No acute vascular findings. Right femoral central venous catheter in appropriate position. Reproductive: Prior hysterectomy noted. Adnexal regions are unremarkable in appearance. Other: Soft tissue gas noted in the right inguinal region and abductor muscles of the thigh, likely related to the right femoral venous catheter placement. Musculoskeletal:  No suspicious bone lesions identified. IMPRESSION: Mild proctocolitis involving the rectosigmoid, without significant change. No evidence of abscess or other complication. Mild colonic ileus. Stable small pericardial effusion. Electronically Signed   By: Marlaine Hind M.D.   On: 12/23/2021 19:08   DG Chest Port 1 View  Result Date: 12/23/2021 CLINICAL DATA:  Found unresponsive. EXAM: PORTABLE CHEST 1 VIEW COMPARISON:  March 20, 2021 FINDINGS: The heart size and mediastinal contours are within normal limits. Both lungs are clear. The visualized skeletal structures are unremarkable. IMPRESSION: No active disease. Electronically Signed   By: Virgina Norfolk M.D.   On: 12/23/2021 17:18   DG Abdomen 1 View  Result Date: 12/23/2021 CLINICAL DATA:  Central line placement EXAM: ABDOMEN - 1 VIEW COMPARISON:  None Available. FINDINGS: Right femoral central line is in place with the tip in the expected location of the common iliac vein. Bowel gas pattern is unremarkable. IMPRESSION: Central line on the right at the expected location of the common iliac vein. Electronically Signed   By: Nelson Chimes M.D.   On: 12/23/2021 15:36    Assessment  51 y.o. female with a history of asthma, anxiety, PTSD, type 2 diabetes (not on medication), seizures, HTN, GERD,  Crohn's colitis with history of stricturing (diagnosed at age 50) who presented to he ED via EMS after neighbor found patient unresponsive on bathroom floor. Admitted with severe sepsis in setting of recurrent Cdiff. CT with mild proctocolitis, mild colonic ileus. Cdiff antigen positive, toxin negative, PCR positive.   Cdiff: previously treated 2 months ago. Recurrent. leukocytosis resolved. Last recorded stool output dated 400 ml on 8/26 in evening. Flexiseal bag currently with just over 400 ml present. Query if stool output has decreased but difficult to tell. Will need to follow strict I/0s. Continue with oral vanc and IV Flagyl.   Crohn's colitis: continue IV Solu-medrol until able to increase diet and then change to prednisone 40 mg daily. Complex history. Allergic reactions to Humira and Remicade with plans to avoid anti-TNF agents. Numerous hospital admissions. Disjointed care and seen at [facility], [place], and upcoming [facility] appt. Plans for changing to [facility] vs surgical management. Needs to have streamlined care at one facility.   Reporting worsening pain today and abdominal distension. Will pursue abdominal xray. May need updated CT. Clinically does not look in acute distress.     Plan / Recommendations  Strict documentation of stool output Continue solu-medrol daily IV until able to advance diet, then transition to oral prednisone Continue IV Flagyl and oral vanc  Abdominal xray today. If worsening pain, recommend repeat CT Needs outpatient follow-up with one tertiary center Recommend outpatient consideration of FMT      LOS: 5 days    12/28/2021, 11:08 AM  Annitta Needs, PhD, ANP-BC [facility]

## 2021-12-28 NOTE — Progress Notes (Signed)
Abdominal xray reviewed. Air-filled dilated loops of large and small bowel, loop of colon in RUQ measures up to 8.2 cm. CT abd/pelvis ordered.

## 2021-12-28 NOTE — Progress Notes (Signed)
Rad tech in unit to give patient oral contrast. Per tech, patient was "obtunded." This nurse was in room to assess patient and she was sleeping but awakened to voice. Voice is weak. Patient states she feels very weak compared to earlier. Is continuing to have a lot of pain and tenderness in her abdomen as well as some distention/firmness in the bilateral lower quadrants. Patient is receiving oral contrast at this time and phenergan ordered from pharmacy to help patient with nauseous feeling. Dr [name] notified of patient condition and per Dr [name], status is going to be changed and he would like for her to stay in ICU. Charge nurse notified.

## 2021-12-29 ENCOUNTER — Telehealth: Payer: Self-pay | Admitting: Gastroenterology

## 2021-12-29 DIAGNOSIS — K50118 Crohn's disease of large intestine with other complication: Secondary | ICD-10-CM | POA: Diagnosis not present

## 2021-12-29 DIAGNOSIS — R6520 Severe sepsis without septic shock: Secondary | ICD-10-CM | POA: Diagnosis not present

## 2021-12-29 DIAGNOSIS — A419 Sepsis, unspecified organism: Secondary | ICD-10-CM | POA: Diagnosis not present

## 2021-12-29 DIAGNOSIS — A0472 Enterocolitis due to Clostridium difficile, not specified as recurrent: Secondary | ICD-10-CM | POA: Diagnosis not present

## 2021-12-29 LAB — BASIC METABOLIC PANEL
Anion gap: 5 (ref 5–15)
BUN: 5 mg/dL — ABNORMAL LOW (ref 6–20)
CO2: 25 mmol/L (ref 22–32)
Calcium: 8.2 mg/dL — ABNORMAL LOW (ref 8.9–10.3)
Chloride: 108 mmol/L (ref 98–111)
Creatinine, Ser: 0.43 mg/dL — ABNORMAL LOW (ref 0.44–1.00)
GFR, Estimated: >60 mL/min (ref >60)
Glucose, Bld: 64 mg/dL — ABNORMAL LOW (ref 70–99)
Potassium: 3.8 mmol/L (ref 3.5–5.1)
Sodium: 138 mmol/L (ref 135–145)

## 2021-12-29 LAB — VITAMIN B6: Vitamin B6: 14.2 ug/L (ref 3.4–65.2)

## 2021-12-29 LAB — MAGNESIUM: Magnesium: 1.6 mg/dL — ABNORMAL LOW (ref 1.7–2.4)

## 2021-12-29 LAB — CBC
HCT: 32.8 % — ABNORMAL LOW (ref 36.0–46.0)
Hemoglobin: 10.9 g/dL — ABNORMAL LOW (ref 12.0–15.0)
MCH: 31.7 pg (ref 26.0–34.0)
MCHC: 33.2 g/dL (ref 30.0–36.0)
MCV: 95.3 fL (ref 80.0–100.0)
Platelets: 231 10*3/uL (ref 150–400)
RBC: 3.44 MIL/uL — ABNORMAL LOW (ref 3.87–5.11)
RDW: 13.9 % (ref 11.5–15.5)
WBC: 10.6 10*3/uL — ABNORMAL HIGH (ref 4.0–10.5)
nRBC: 0 % (ref 0.0–0.2)

## 2021-12-29 LAB — GLUCOSE, CAPILLARY: Glucose-Capillary: 75 mg/dL (ref 70–99)

## 2021-12-29 MED ORDER — PROMETHAZINE HCL 25 MG/ML IJ SOLN
INTRAMUSCULAR | Status: AC
  Filled 2021-12-29: qty 1

## 2021-12-29 MED ORDER — MAGNESIUM SULFATE 2 GM/50ML IV SOLN
2.0000 g | Freq: Once | INTRAVENOUS | Status: AC
  Administered 2021-12-29: 2 g via INTRAVENOUS
  Filled 2021-12-29: qty 50

## 2021-12-29 NOTE — Progress Notes (Signed)
MD notified of urine output of 3650 in  to foley.

## 2021-12-29 NOTE — Progress Notes (Signed)
PROGRESS NOTE    [name]  YSA:[id number] DOB: 29-Aug-1970 DOA: 12/23/2021 PCP: Teodoro Kil, PA-C   Brief Narrative:  [name] is a 51 y.o. female with medical history significant for Crohn's disease, hypertension, diabetes, seizures. Patient was found unresponsive at home. She reports chronic diarrhea, up to 10 times a day.  Reports intermittent vomiting-Chronic.  She has been admitted with severe sepsis, present on admission secondary to C. difficile colitis with associated acute metabolic encephalopathy as well as AKI.  She is also thought to have a UTI and is on multiple antibiotics.  GI consulted with recommendations to transfer to tertiary care center, but no beds are currently available at multiple facilities.  She currently appears to be improving and does not need transfer at the moment. GI following and stool output diminishing, but still on CLD.  Assessment & Plan:   Principal Problem:   Severe sepsis ([place]) Active Problems:   Acute metabolic encephalopathy   Proctocolitis   AKI (acute kidney injury) ([place])   UTI (urinary tract infection)   COPD (chronic obstructive pulmonary disease) (HCC)   Crohn's disease ([place])   Diabetes mellitus without complication (HCC)   Lactic acidosis   Essential hypertension   MDD (major depressive disorder)   Seizure (HCC)   C. difficile colitis  Assessment and Plan:   Septic shock, present on admission, due to Cdiff colitis -Appreciate ongoing GI evaluation with CT abdomen and pelvis performed 8/28 with no signs of toxic megacolon noted -Continue now on oral vancomycin as well as IV Flagyl -Continue to monitor closely -Clear liquid diet and advance as tolerated -Okay for transfer to telemetry today   Chronic enterocolitis Diffuse abdominal tenderness, continues to have large-volume diarrhea.  History of Crohn's disease, not on treatment.  Reported blood in stools.  Hemoglobin stable. -History of Crohn's, appreciate GI  evaluation with history of chronic diarrhea Continue Solu-Medrol as ordered for now   Mild hypomagnesemia Replete and recheck in a.m.   Acute metabolic encephalopathy-resolved Found down and unresponsive.  Mental status has improved in the ED.  Was likely down for 2 days. -Head CT with no acute findings -Has some ongoing baseline confusion   E. coli UTI (urinary tract infection) Changed to Bactrim 8/28 -ESBL   AKI (acute kidney injury)-resolved Creatinine 1.58, baseline about 0.6-1.  Likely from dehydration, hypotension, sepsis. Continue IV fluid and monitor due to ongoing liquid stool output   Seizure (HCC) Resumed Keppra   Essential hypertension-stable Initial hypotension systolic down to 32T.  Likely from no oral intake in the past 2 days, and sepsis. -Not on antihypertensives.   Diabetes mellitus without complication ([place]) Not on medication. - HgbA1c 4.7% -Monitor CBG daily   DVT prophylaxis:SCDs Code Status: Full Family Communication: Discussed with daughter Luetta Nutting [phone] Disposition Plan:  Status is: Inpatient Remains inpatient appropriate because: Need for pressors and IV medications.   Consultants:  GI   Procedures:  None   Antimicrobials:  Anti-infectives (From admission, onward)    Start     Dose/Rate Route Frequency Ordered Stop   12/28/21 1000  sulfamethoxazole-trimethoprim (BACTRIM DS) 800-160 MG per tablet 1 tablet        1 tablet Oral Every 12 hours 12/28/21 0844     12/26/21 1100  meropenem (MERREM) 1 g in sodium chloride 0.9 % 100 mL IVPB  Status:  Discontinued        1 g 200 mL/hr over 30 Minutes Intravenous Every 8 hours 12/26/21 1013 12/28/21 0844   12/24/21 1600  metroNIDAZOLE (FLAGYL) IVPB 500 mg  Status:  Discontinued        500 mg 100 mL/hr over 60 Minutes Intravenous Every 12 hours 12/24/21 0729 12/24/21 1141   12/24/21 1230  vancomycin (VANCOCIN) capsule 500 mg        500 mg Oral Every 6 hours 12/24/21 1141 01/07/22 1159    12/24/21 1230  metroNIDAZOLE (FLAGYL) IVPB 500 mg        500 mg 100 mL/hr over 60 Minutes Intravenous Every 8 hours 12/24/21 1141 01/07/22 1229   12/24/21 0400  ceFEPIme (MAXIPIME) 2 g in sodium chloride 0.9 % 100 mL IVPB  Status:  Discontinued        2 g 200 mL/hr over 30 Minutes Intravenous Every 12 hours 12/23/21 1535 12/23/21 2224   12/24/21 0400  cefTRIAXone (ROCEPHIN) 2 g in sodium chloride 0.9 % 100 mL IVPB  Status:  Discontinued        2 g 200 mL/hr over 30 Minutes Intravenous Every 24 hours 12/23/21 2224 12/26/21 1013   12/24/21 0400  metroNIDAZOLE (FLAGYL) IVPB 500 mg        500 mg 100 mL/hr over 60 Minutes Intravenous  Once 12/23/21 2224 12/24/21 0430   12/23/21 1545  ceFEPIme (MAXIPIME) 2 g in sodium chloride 0.9 % 100 mL IVPB        2 g 200 mL/hr over 30 Minutes Intravenous  Once 12/23/21 1530 12/23/21 1615   12/23/21 1530  aztreonam (AZACTAM) 2 g in sodium chloride 0.9 % 100 mL IVPB  Status:  Discontinued        2 g 200 mL/hr over 30 Minutes Intravenous  Once 12/23/21 1523 12/23/21 1530   12/23/21 1530  metroNIDAZOLE (FLAGYL) IVPB 500 mg        500 mg 100 mL/hr over 60 Minutes Intravenous  Once 12/23/21 1523 12/23/21 1630      Subjective: Patient seen and evaluated today with some ongoing abdominal pain.  She tends to have very high urine output, but stool output is diminishing.  Tolerating clear liquid diet.  Objective: Vitals:   12/29/21 0600 12/29/21 0730 12/29/21 0746 12/29/21 1100  BP: 124/66     Pulse: (!) 106     Resp: 15     Temp:   98 F (36.7 C) 98.9 F (37.2 C)  TempSrc:   Axillary Axillary  SpO2: 92% 96%    Weight:      Height:        Intake/Output Summary (Last 24 hours) at 12/29/2021 1119 Last data filed at 12/29/2021 0600 Gross per 24 hour  Intake 1944.49 ml  Output 6100 ml  Net -4155.51 ml   Filed Weights   12/26/21 0500 12/27/21 0500 12/29/21 0421  Weight: 57.9 kg 57.6 kg 59.3 kg    Examination:  General exam: Appears calm and  comfortable  Respiratory system: Clear to auscultation. Respiratory effort normal. Cardiovascular system: S1 & S2 heard, RRR.  Gastrointestinal system: Abdomen is soft, tender to palpation throughout Central nervous system: Alert and awake Extremities: No edema Skin: No significant lesions noted Psychiatry: Flat affect. Foley with clear, yellow urine output Flexi-Seal with liquid stool output ongoing    Data Reviewed: I have personally reviewed following labs and imaging studies  CBC: Recent Labs  Lab 12/23/21 1524 12/23/21 1533 12/25/21 0600 12/26/21 0344 12/27/21 0414 12/28/21 0359 12/29/21 0800  WBC 24.2*   < > 20.0* 19.0* 10.4 9.0 10.6*  NEUTROABS 20.1*  --   --   --   --   --   --  HGB 11.1*   < > 11.3* 10.5* 9.8* 9.5* 10.9*  HCT 35.1*   < > 33.6* 31.7* 30.5* 29.1* 32.8*  MCV 102.0*   < > 93.9 96.1 95.9 96.4 95.3  PLT 425*   < > 238 195 165 174 231   < > = values in this interval not displayed.   Basic Metabolic Panel: Recent Labs  Lab 12/25/21 0600 12/26/21 0344 12/27/21 0414 12/28/21 0359 12/29/21 0345  NA 127* 139 136 140 138  K 4.5 3.8 3.4* 3.3* 3.8  CL 102 110 107 109 108  CO2 23 23 23 27 25   GLUCOSE 148* 97 101* 78 64*  BUN 18 12 10 6  5*  CREATININE 0.54 0.53 0.50 0.44 0.43*  CALCIUM 6.9* 8.0* 8.0* 8.1* 8.2*  MG 2.3 2.5* 1.7 1.5* 1.6*   GFR: Estimated Creatinine Clearance: 71 mL/min (A) (by C-G formula based on SCr of 0.43 mg/dL (L)). Liver Function Tests: Recent Labs  Lab 12/23/21 1524 12/25/21 0600  AST 21 34  ALT 12 14  ALKPHOS 150* 119  BILITOT 0.6 0.6  PROT 4.3* 4.2*  ALBUMIN 1.9* 1.7*   No results for input(s): "LIPASE", "AMYLASE" in the last 168 hours. No results for input(s): "AMMONIA" in the last 168 hours. Coagulation Profile: Recent Labs  Lab 12/23/21 1527  INR 1.2   Cardiac Enzymes: Recent Labs  Lab 12/23/21 2343  CKTOTAL 23*   BNP (last 3 results) No results for input(s): "PROBNP" in the last 8760  hours. HbA1C: No results for input(s): "HGBA1C" in the last 72 hours. CBG: Recent Labs  Lab 12/25/21 0359 12/25/21 1141 12/27/21 0433 12/28/21 0659 12/29/21 0521  GLUCAP 145* 106* 86 79 75   Lipid Profile: No results for input(s): "CHOL", "HDL", "LDLCALC", "TRIG", "CHOLHDL", "LDLDIRECT" in the last 72 hours. Thyroid Function Tests: No results for input(s): "TSH", "T4TOTAL", "FREET4", "T3FREE", "THYROIDAB" in the last 72 hours. Anemia Panel: No results for input(s): "VITAMINB12", "FOLATE", "FERRITIN", "TIBC", "IRON", "RETICCTPCT" in the last 72 hours. Sepsis Labs: Recent Labs  Lab 12/23/21 1527 12/23/21 1721 12/25/21 0600  LATICACIDVEN 5.9* 2.8* 1.2    Recent Results (from the past 240 hour(s))  Gastrointestinal Panel by PCR , Stool     Status: Abnormal   Collection Time: 12/23/21 12:37 AM   Specimen: Stool  Result Value Ref Range Status   Campylobacter species NOT DETECTED NOT DETECTED Final   Plesimonas shigelloides NOT DETECTED NOT DETECTED Final   Salmonella species DETECTED (A) NOT DETECTED Final    Comment: RESULT CALLED TO, READ BACK BY AND VERIFIED WITH: C/CODY [facility] 12/24/21 1927    Yersinia enterocolitica NOT DETECTED NOT DETECTED Final   Vibrio species NOT DETECTED NOT DETECTED Final   Vibrio cholerae NOT DETECTED NOT DETECTED Final   Enteroaggregative E coli (EAEC) NOT DETECTED NOT DETECTED Final   Enteropathogenic E coli (EPEC) NOT DETECTED NOT DETECTED Final   Enterotoxigenic E coli (ETEC) NOT DETECTED NOT DETECTED Final   Shiga like toxin producing E coli (STEC) NOT DETECTED NOT DETECTED Final   Shigella/Enteroinvasive E coli (EIEC) NOT DETECTED NOT DETECTED Final   Cryptosporidium NOT DETECTED NOT DETECTED Final   Cyclospora cayetanensis NOT DETECTED NOT DETECTED Final   Entamoeba histolytica NOT DETECTED NOT DETECTED Final   Giardia lamblia NOT DETECTED NOT DETECTED Final   Adenovirus F40/41 NOT DETECTED NOT DETECTED Final   Astrovirus NOT DETECTED  NOT DETECTED Final   Norovirus GI/GII NOT DETECTED NOT DETECTED Final   Rotavirus A NOT DETECTED NOT  DETECTED Final   Sapovirus (I, II, IV, and V) NOT DETECTED NOT DETECTED Final    Comment: Performed at [facility], [address] [place], [place] [address]  Blood Culture (routine x 2)     Status: None   Collection Time: 12/23/21  3:27 PM   Specimen: Leg; Blood  Result Value Ref Range Status   Specimen Description LEG  Final   Special Requests   Final    BOTTLES DRAWN AEROBIC AND ANAEROBIC Blood Culture results may not be optimal due to an excessive volume of blood received in culture bottles   Culture   Final    NO GROWTH 5 DAYS Performed at [facility], [address], [place], [place] [address]    Report Status 12/28/2021 FINAL  Final  Resp Panel by RT-PCR (Flu A&B, Covid) Anterior Nasal Swab     Status: None   Collection Time: 12/23/21  3:45 PM   Specimen: Anterior Nasal Swab  Result Value Ref Range Status   SARS Coronavirus 2 by RT PCR NEGATIVE NEGATIVE Final    Comment: (NOTE) SARS-CoV-2 target nucleic acids are NOT DETECTED.  The SARS-CoV-2 RNA is generally detectable in upper respiratory specimens during the acute phase of infection. The lowest concentration of SARS-CoV-2 viral copies this assay can detect is 138 copies/mL. A negative result does not preclude SARS-Cov-2 infection and should not be used as the sole basis for treatment or other patient management decisions. A negative result may occur with  improper specimen collection/handling, submission of specimen other than nasopharyngeal swab, presence of viral mutation(s) within the areas targeted by this assay, and inadequate number of viral copies(<138 copies/mL). A negative result must be combined with clinical observations, patient history, and epidemiological information. The expected result is Negative.  Fact Sheet for Patients:  EntrepreneurPulse.com.au  Fact Sheet for  Healthcare Providers:  IncredibleEmployment.be  This test is no t yet approved or cleared by the [place] FDA and  has been authorized for detection and/or diagnosis of SARS-CoV-2 by FDA under an Emergency Use Authorization (EUA). This EUA will remain  in effect (meaning this test can be used) for the duration of the COVID-19 declaration under Section 564(b)(1) of the Act, 21 U.S.C.section 360bbb-3(b)(1), unless the authorization is terminated  or revoked sooner.       Influenza A by PCR NEGATIVE NEGATIVE Final   Influenza B by PCR NEGATIVE NEGATIVE Final    Comment: (NOTE) The Xpert Xpress SARS-CoV-2/FLU/RSV plus assay is intended as an aid in the diagnosis of influenza from Nasopharyngeal swab specimens and should not be used as a sole basis for treatment. Nasal washings and aspirates are unacceptable for Xpert Xpress SARS-CoV-2/FLU/RSV testing.  Fact Sheet for Patients: EntrepreneurPulse.com.au  Fact Sheet for Healthcare Providers: IncredibleEmployment.be  This test is not yet approved or cleared by the [place] FDA and has been authorized for detection and/or diagnosis of SARS-CoV-2 by FDA under an Emergency Use Authorization (EUA). This EUA will remain in effect (meaning this test can be used) for the duration of the COVID-19 declaration under Section 564(b)(1) of the Act, 21 U.S.C. section 360bbb-3(b)(1), unless the authorization is terminated or revoked.  Performed at [facility], [address], [place], [place] [address]   Urine Culture     Status: Abnormal   Collection Time: 12/23/21  4:41 PM   Specimen: In/Out Cath Urine  Result Value Ref Range Status   Specimen Description   Final    IN/OUT CATH URINE Performed at [facility], [address], [place], [place] [address]    Special Requests   Final    NONE Performed at [facility], [address], [place], [place] [address]    Culture (A)  Final     >=100,000 COLONIES/mL ESCHERICHIA COLI Susceptibility Pattern Suggests Possibility of an Extended Spectrum Beta Lactamase Producer. Contact Laboratory Within 7 Days if Confirmation Warranted. Performed at [facility], [place] [address], [place], [place] [address]    Report Status 12/26/2021 FINAL  Final   Organism ID, Bacteria ESCHERICHIA COLI (A)  Final      Susceptibility   Escherichia coli - MIC*    AMPICILLIN >=32 RESISTANT Resistant     CEFAZOLIN >=64 RESISTANT Resistant     CEFEPIME 16 RESISTANT Resistant     CEFTRIAXONE >=64 RESISTANT Resistant     CIPROFLOXACIN >=4 RESISTANT Resistant     GENTAMICIN <=1 SENSITIVE Sensitive     IMIPENEM <=0.25 SENSITIVE Sensitive     NITROFURANTOIN <=16 SENSITIVE Sensitive     TRIMETH/SULFA <=20 SENSITIVE Sensitive     AMPICILLIN/SULBACTAM >=32 RESISTANT Resistant     PIP/TAZO 16 SENSITIVE Sensitive     * >=100,000 COLONIES/mL ESCHERICHIA COLI  MRSA Next Gen by PCR, Nasal     Status: None   Collection Time: 12/23/21 11:01 PM   Specimen: Nasal Mucosa; Nasal Swab  Result Value Ref Range Status   MRSA by PCR Next Gen NOT DETECTED NOT DETECTED Final    Comment: (NOTE) The GeneXpert MRSA Assay (FDA approved for NASAL specimens only), is one component of a comprehensive MRSA colonization surveillance program. It is not intended to diagnose MRSA infection nor to guide or monitor treatment for MRSA infections. Test performance is not FDA approved in patients less than 44 years old. Performed at [facility], [address], [place], [place] [address]   C Difficile Quick Screen w PCR reflex     Status: Abnormal   Collection Time: 12/24/21 12:24 AM   Specimen: Stool  Result Value Ref Range Status   C Diff antigen POSITIVE (A) NEGATIVE Final   C Diff toxin NEGATIVE NEGATIVE Final   C Diff interpretation Results are indeterminate. See PCR results.  Final    Comment: Performed at [facility], [address] [place], [place] [address]  C.  Diff by PCR, Reflexed     Status: Abnormal   Collection Time: 12/24/21 12:24 AM  Result Value Ref Range Status   Toxigenic C. Difficile by PCR POSITIVE (A) NEGATIVE Final    Comment: Positive for toxigenic C. difficile with little to no toxin production. Only treat if clinical presentation suggests symptomatic illness. Performed at [facility], [place] [address], [place], [place] [address]          Radiology Studies: CT ABDOMEN PELVIS W CONTRAST  Result Date: 12/28/2021 CLINICAL DATA:  History of C diff. Now progressive ileus and distention. Bowel obstruction suspected. Evaluate for megacolon. Abdominal pain. Abnormal abdominal x-ray. History of Crohn's disease. EXAM: CT ABDOMEN AND PELVIS WITH CONTRAST TECHNIQUE: Multidetector CT imaging of the abdomen and pelvis was performed using the standard protocol following bolus administration of intravenous contrast. RADIATION DOSE REDUCTION: This exam was performed according to the departmental dose-optimization program which includes automated exposure control, adjustment of the mA and/or kV according to patient size and/or use of iterative reconstruction technique. CONTRAST:  164m OMNIPAQUE IOHEXOL 300 MG/ML  SOLN COMPARISON:  [facility] 12/28/2021 and 12/23/2021; CT abdomen and pelvis 12/23/2021 FINDINGS: Lower chest: Heart size  is again at the upper limits of normal. Mild to moderate pericardial effusion is unchanged. Hepatobiliary: Smooth liver contours. No focal liver mass is identified. Mildly decreased attenuation throughout the liver suggesting mild fatty infiltration. Cholecystectomy clips. No intrahepatic or extrahepatic biliary ductal dilatation. Pancreas: No mass or inflammatory fat stranding. No pancreatic ductal dilatation is seen. Spleen: Normal in size without focal abnormality. Adrenals/Urinary Tract: Adrenal glands are unremarkable. The kidneys enhance uniformly and are symmetric in size without hydronephrosis. No renal stone is seen. No  renal mass is seen. The urinary bladder is decompressed by indwelling Foley catheter. Minimal associated air within the nondependent aspect of the urinary bladder. Stomach/Bowel: Rectal balloon catheter is again noted. The rectum and sigmoid colon are now decompressed with likely similar mild wall thickening again suggesting proctocolitis. Mild wall thickening of the proximal descending colon (axial series 2, image 41) appears new from prior. There is oral contrast seen as distal as the ileum. There are air-fluid levels again seen within the ascending, transverse, and proximal descending colon with fluid also seen within the more distal colon. No loss of haustra markings within the colon to specifically raise suspicion for toxic megacolon. No dilated loops of small bowel are seen. Vascular/Lymphatic: No abdominal aortic aneurysm. No mesenteric, retroperitoneal, or pelvic lymphadenopathy. Reproductive: The uterus is surgically absent. No gross adnexal abnormality. Other: No abdominal wall hernia or abnormality.Moderate subcutaneous fat edema within the bilateral lower flanks and buttocks. No pneumoperitoneum. Mild free fluid within the pelvis is increased from prior. Musculoskeletal: Minimal levocurvature centered at L3. Mild anterior L2-3 endplate osteophytes. IMPRESSION: Compared to 12/23/2021: 1. Redemonstration of wall thickening within the rectum and sigmoid colon suggesting proctocolitis. New mild wall thickening within a portion of the proximal descending colon. Air-fluid levels throughout the colon as can be seen with diarrheal illness. 2. No dilated loops of small bowel to indicate bowel obstruction. 3. Mildly increased mild free fluid within the pelvis. 4. Increased moderate bilateral flank and buttock subcutaneous fat edema. Electronically Signed   By: Yvonne Kendall M.D.   On: 12/28/2021 18:18   DG Abd Portable 2V  Result Date: 12/28/2021 CLINICAL DATA:  Abdominal pain EXAM: PORTABLE ABDOMEN - 2 VIEW  COMPARISON:  December 23, 2021 FINDINGS: Air-filled prominent loops of large and small bowel are identified. A loop of colon in the right upper quadrant is dilated up to 8.2 cm. The amount of dilatation is increased since December 23, 2021. No free air, portal venous gas, or pneumatosis. No other acute abnormalities. IMPRESSION: Air-filled dilated loops of large and small bowel most consistent with ileus. There is a loop of colon in the right upper quadrant measuring up to 8.2 cm. Recommend attention on follow-up. Electronically Signed   By: Dorise Bullion III M.D.   On: 12/28/2021 11:58        Scheduled Meds:  amitriptyline  100 mg Oral QHS   Chlorhexidine Gluconate Cloth  6 each Topical Daily   levETIRAcetam  750 mg Oral BID   methylPREDNISolone (SOLU-MEDROL) injection  60 mg Intravenous Daily   pantoprazole  40 mg Oral Daily   potassium chloride  40 mEq Oral BID   sodium chloride flush  10-40 mL Intracatheter Q12H   sulfamethoxazole-trimethoprim  1 tablet Oral Q12H   umeclidinium bromide  1 puff Inhalation Daily   vancomycin  500 mg Oral Q6H   Continuous Infusions:  lactated ringers 75 mL/hr at 12/29/21 0358   metronidazole 500 mg (12/29/21 0359)   norepinephrine (LEVOPHED) Adult infusion Stopped (  12/25/21 1324)   promethazine (PHENERGAN) injection (IM or IVPB) 12.5 mg (12/28/21 1656)   vasopressin Stopped (12/25/21 0927)     LOS: 6 days    Time spent: 35 minutes    Karmelo Bass D [name], DO [facility]  If 7PM-7AM, please contact night-coverage [email or web address] 12/29/2021, 11:19 AM

## 2021-12-29 NOTE — Progress Notes (Signed)
Subjective: Feels about the same as yesterday.  Continues with abdominal pain, primarily in the right lower quadrant.  States its intermittent.  Feels like a cramp.  Like she needs to have a bowel movement.  Pain is unaffected by eating.  She is tolerating clear liquids well.  Mild intermittent nausea, but no vomiting.  Nurses reports she changed Flexi-Seal bag at 5 PM yesterday.  She had 300 mL stool output recorded at 6 AM this morning.  There is recorded total of 450 output yesterday.    100 mL stool output over the last 5-1/2 hours.   No overt bleeding.   Nurse states patient looks much better today.   Objective: Vital signs in last 24 hours: Temp:  [97.7 F (36.5 C)-98.9 F (37.2 C)] 98.9 F (37.2 C) (08/29 1100) Pulse Rate:  [87-113] 106 (08/29 0600) Resp:  [15-19] 15 (08/29 0600) BP: (53-131)/(24-71) 124/66 (08/29 0600) SpO2:  [91 %-97 %] 96 % (08/29 0730) Weight:  [59.3 kg] 59.3 kg (08/29 0421) Last BM Date : 12/27/21 General:   Alert and oriented, pleasant, NAD Head:  Normocephalic and atraumatic. Eyes:  No icterus, sclera clear. Conjuctiva pink.  Abdomen:  Bowel sounds present, non-distended.  Mild TTP, primarily across lower abdomen with mild firmness in the suprapubic region. No HSM or hernias noted. No rebound or guarding. No masses appreciated  Msk:  Symmetrical without gross deformities. Normal posture. Extremities:  Without edema. Neurologic:  Alert and  oriented x4;  grossly normal neurologically. Psych:  Normal mood and affect.  Intake/Output from previous day: 08/28 0701 - 08/29 0700 In: 2327.8 [P.O.:360; I.V.:1467.8; IV Piggyback:500] Out: 3383 [Urine:6000; Stool:450] Intake/Output this shift: No intake/output data recorded.  Lab Results: Recent Labs    12/27/21 0414 12/28/21 0359 12/29/21 0800  WBC 10.4 9.0 10.6*  HGB 9.8* 9.5* 10.9*  HCT 30.5* 29.1* 32.8*  PLT 165 174 231   BMET Recent Labs    12/27/21 0414 12/28/21 0359 12/29/21 0345   NA 136 140 138  K 3.4* 3.3* 3.8  CL 107 109 108  CO2 23 27 25   GLUCOSE 101* 78 64*  BUN 10 6 5*  CREATININE 0.50 0.44 0.43*  CALCIUM 8.0* 8.1* 8.2*   Studies/Results: CT ABDOMEN PELVIS W CONTRAST  Result Date: 12/28/2021 CLINICAL DATA:  History of C diff. Now progressive ileus and distention. Bowel obstruction suspected. Evaluate for megacolon. Abdominal pain. Abnormal abdominal x-ray. History of Crohn's disease. EXAM: CT ABDOMEN AND PELVIS WITH CONTRAST TECHNIQUE: Multidetector CT imaging of the abdomen and pelvis was performed using the standard protocol following bolus administration of intravenous contrast. RADIATION DOSE REDUCTION: This exam was performed according to the departmental dose-optimization program which includes automated exposure control, adjustment of the mA and/or kV according to patient size and/or use of iterative reconstruction technique. CONTRAST:  123m OMNIPAQUE IOHEXOL 300 MG/ML  SOLN COMPARISON:  KUB 12/28/2021 and 12/23/2021; CT abdomen and pelvis 12/23/2021 FINDINGS: Lower chest: Heart size is again at the upper limits of normal. Mild to moderate pericardial effusion is unchanged. Hepatobiliary: Smooth liver contours. No focal liver mass is identified. Mildly decreased attenuation throughout the liver suggesting mild fatty infiltration. Cholecystectomy clips. No intrahepatic or extrahepatic biliary ductal dilatation. Pancreas: No mass or inflammatory fat stranding. No pancreatic ductal dilatation is seen. Spleen: Normal in size without focal abnormality. Adrenals/Urinary Tract: Adrenal glands are unremarkable. The kidneys enhance uniformly and are symmetric in size without hydronephrosis. No renal stone is seen. No renal mass is seen. The urinary  bladder is decompressed by indwelling Foley catheter. Minimal associated air within the nondependent aspect of the urinary bladder. Stomach/Bowel: Rectal balloon catheter is again noted. The rectum and sigmoid colon are now  decompressed with likely similar mild wall thickening again suggesting proctocolitis. Mild wall thickening of the proximal descending colon (axial series 2, image 41) appears new from prior. There is oral contrast seen as distal as the ileum. There are air-fluid levels again seen within the ascending, transverse, and proximal descending colon with fluid also seen within the more distal colon. No loss of haustra markings within the colon to specifically raise suspicion for toxic megacolon. No dilated loops of small bowel are seen. Vascular/Lymphatic: No abdominal aortic aneurysm. No mesenteric, retroperitoneal, or pelvic lymphadenopathy. Reproductive: The uterus is surgically absent. No gross adnexal abnormality. Other: No abdominal wall hernia or abnormality.Moderate subcutaneous fat edema within the bilateral lower flanks and buttocks. No pneumoperitoneum. Mild free fluid within the pelvis is increased from prior. Musculoskeletal: Minimal levocurvature centered at L3. Mild anterior L2-3 endplate osteophytes. IMPRESSION: Compared to 12/23/2021: 1. Redemonstration of wall thickening within the rectum and sigmoid colon suggesting proctocolitis. New mild wall thickening within a portion of the proximal descending colon. Air-fluid levels throughout the colon as can be seen with diarrheal illness. 2. No dilated loops of small bowel to indicate bowel obstruction. 3. Mildly increased mild free fluid within the pelvis. 4. Increased moderate bilateral flank and buttock subcutaneous fat edema. Electronically Signed   By: Yvonne Kendall M.D.   On: 12/28/2021 18:18   DG Abd Portable 2V  Result Date: 12/28/2021 CLINICAL DATA:  Abdominal pain EXAM: PORTABLE ABDOMEN - 2 VIEW COMPARISON:  December 23, 2021 FINDINGS: Air-filled prominent loops of large and small bowel are identified. A loop of colon in the right upper quadrant is dilated up to 8.2 cm. The amount of dilatation is increased since December 23, 2021. No free air, portal  venous gas, or pneumatosis. No other acute abnormalities. IMPRESSION: Air-filled dilated loops of large and small bowel most consistent with ileus. There is a loop of colon in the right upper quadrant measuring up to 8.2 cm. Recommend attention on follow-up. Electronically Signed   By: Dorise Bullion III M.D.   On: 12/28/2021 11:58    Assessment: 51 y.o. female with a history of asthma, anxiety, PTSD, type 2 diabetes (not on medication), seizures, HTN, GERD, Crohn's colitis with history of stricturing (diagnosed at age 14) who presented to he ED via EMS after neighbor found patient unresponsive on bathroom floor. Admitted with severe sepsis in setting of recurrent Cdiff. CT with mild proctocolitis, mild colonic ileus. Cdiff antigen positive, toxin negative, PCR positive.   Cdiff:  Previously treated 2 months ago. Recurrent.  Leukocytosis have resolved.  Mild bump today up to 10.6 which could be reactive to steroids.  Remains afebrile.  Currently on oral vancomycin 500 mg 4 times daily and IV Flagyl 3 times daily.  450 mL stool output over the last 24 hours.  100 mL stool output over the last 5-1/2 hours.  Difficult to determine if this is improving as well but has not been consistently recorded throughout this admission.  We will need to continue with strict I's and O's.  She had reported some worsening abdominal pain and distention yesterday.  Abdominal x-ray with air-filled dilated loops of large and small bowel consistent with ileus.  Follow-up CT A/P redemonstrated wall thickening of the rectum and sigmoid colon, new mild wall thickening and portion of proximal descending  colon, no findings of ileus, bowel obstruction, or suspicion of toxic megacolon. Clinically, she feels about the same today with pain primarily in the RLQ. On exam, she has mild TTP, primarily across lower abdomen with mild firmness in the suprapubic region which was also present yesterday. We will continue current regimen.  Crohn's  colitis:  Currently on IV Solu-Medrol until she is able to advance her diet, then she will transition to prednisone 40 mg daily.  She has a very complex history.  She had allergic reactions to Humira and Remicade with plans to avoid anti-TNF agents.  Numerous hospital admissions.  She has had disjointed care and has been seen at [facility], [place], and upcoming appointment at [facility].  Previously Dr. Nyoka Cowden with [facility] was in the process of work-up to initiate Stelara versus surgical management, but care has been interrupted by multiple hospitalizations leading to missed appointments.  She needs to have streamlined care at one facility.    Plan: Continue IV Flagyl and oral vancomycin. Continue IV Solu-Medrol until able to advance diet, then transition to oral prednisone. Continue with strict documentation of stool output. Needs outpatient follow-up with one tertiary care center. Recommend outpatient consideration for FMT.   LOS: 6 days    12/29/2021, 11:24 AM   Aliene Altes, PA-C [facility]

## 2021-12-29 NOTE — Progress Notes (Signed)
Speech Language Pathology Treatment: Dysphagia  Patient Details Name: Yarelin Reichardt MRN: [id number] DOB: 22-Jan-1971 Today's Date: 12/29/2021 Time: 8616-8372 SLP Time Calculation (min) (ACUTE ONLY): 13 min  Assessment / Plan / Recommendation Clinical Impression  Pt seen for ongoing diagnostic dysphagia treatment. Pt consumed limited trials of thin liquids without overt s/sx of aspiration. Pt reports no further difficulty with pills (initial eval requested secondary to difficulty with meds). Still unable to assess Pt with solids, however she reports historically no difficulty with solids. Defer to GI for diet recommendation. There are no further ST needs noted at this time, if new concerns arise when solid diet is initiated by GI please re-consult if indicated. Thank you,    HPI HPI: Siona Coulston is a 51 y.o. female with medical history significant for Crohn's disease, hypertension, diabetes, seizures. Patient was found unresponsive at home. She reports chronic diarrhea, up to 10 times a day.  Reports intermittent vomiting-Chronic.  She has been admitted with severe sepsis, present on admission secondary to C. difficile colitis with associated acute metabolic encephalopathy as well as AKI.  She is also thought to have a UTI and is on multiple antibiotics.  GI consulted with recommendations to transfer to tertiary care center, but no beds are currently available at multiple facilities.      SLP Plan  All goals met      Recommendations for follow up therapy are one component of a multi-disciplinary discharge planning process, led by the attending physician.  Recommendations may be updated based on patient status, additional functional criteria and insurance authorization.    Recommendations  Diet recommendations: Thin liquid Liquids provided via: Cup;Straw Medication Administration: Whole meds with puree Supervision: Patient able to self feed Compensations: Slow rate;Small sips/bites                 Oral Care Recommendations: Oral care BID Follow Up Recommendations: No SLP follow up Assistance recommended at discharge: None SLP Visit Diagnosis: Dysphagia, unspecified (R13.10) Plan: All goals met          Yaneisy Wenz H. Roddie Mc, CCC-SLP Speech Language Pathologist  Wende Bushy  12/29/2021, 4:29 PM

## 2021-12-29 NOTE — Telephone Encounter (Signed)
Advance to request patient to be scheduled for follow-up with Dr. Nyoka Cowden.  Spoke with nurse who put in request for patient to have appointment scheduled within the next few weeks as they did not currently have the physicians schedules available due to "transitioning".  Stated they would reach out to the patient to arrange this visit.

## 2021-12-30 ENCOUNTER — Inpatient Hospital Stay (HOSPITAL_COMMUNITY): Payer: 59

## 2021-12-30 DIAGNOSIS — A0472 Enterocolitis due to Clostridium difficile, not specified as recurrent: Secondary | ICD-10-CM | POA: Diagnosis not present

## 2021-12-30 DIAGNOSIS — R6521 Severe sepsis with septic shock: Secondary | ICD-10-CM

## 2021-12-30 DIAGNOSIS — I10 Essential (primary) hypertension: Secondary | ICD-10-CM

## 2021-12-30 DIAGNOSIS — A419 Sepsis, unspecified organism: Secondary | ICD-10-CM | POA: Diagnosis not present

## 2021-12-30 DIAGNOSIS — K50118 Crohn's disease of large intestine with other complication: Secondary | ICD-10-CM

## 2021-12-30 DIAGNOSIS — R112 Nausea with vomiting, unspecified: Secondary | ICD-10-CM

## 2021-12-30 DIAGNOSIS — R6520 Severe sepsis without septic shock: Secondary | ICD-10-CM | POA: Diagnosis not present

## 2021-12-30 DIAGNOSIS — N390 Urinary tract infection, site not specified: Secondary | ICD-10-CM

## 2021-12-30 DIAGNOSIS — N179 Acute kidney failure, unspecified: Secondary | ICD-10-CM | POA: Diagnosis not present

## 2021-12-30 DIAGNOSIS — G9341 Metabolic encephalopathy: Secondary | ICD-10-CM | POA: Diagnosis not present

## 2021-12-30 DIAGNOSIS — R197 Diarrhea, unspecified: Secondary | ICD-10-CM

## 2021-12-30 LAB — BASIC METABOLIC PANEL
Anion gap: 5 (ref 5–15)
BUN: <5 mg/dL — ABNORMAL LOW (ref 6–20)
CO2: 25 mmol/L (ref 22–32)
Calcium: 8.1 mg/dL — ABNORMAL LOW (ref 8.9–10.3)
Chloride: 108 mmol/L (ref 98–111)
Creatinine, Ser: 0.54 mg/dL (ref 0.44–1.00)
GFR, Estimated: >60 mL/min (ref >60)
Glucose, Bld: 65 mg/dL — ABNORMAL LOW (ref 70–99)
Potassium: 4 mmol/L (ref 3.5–5.1)
Sodium: 138 mmol/L (ref 135–145)

## 2021-12-30 LAB — CBC
HCT: 31 % — ABNORMAL LOW (ref 36.0–46.0)
Hemoglobin: 10.1 g/dL — ABNORMAL LOW (ref 12.0–15.0)
MCH: 31.3 pg (ref 26.0–34.0)
MCHC: 32.6 g/dL (ref 30.0–36.0)
MCV: 96 fL (ref 80.0–100.0)
Platelets: 220 10*3/uL (ref 150–400)
RBC: 3.23 MIL/uL — ABNORMAL LOW (ref 3.87–5.11)
RDW: 14 % (ref 11.5–15.5)
WBC: 8 10*3/uL (ref 4.0–10.5)
nRBC: 0 % (ref 0.0–0.2)

## 2021-12-30 LAB — GLUCOSE, CAPILLARY: Glucose-Capillary: 65 mg/dL — ABNORMAL LOW (ref 70–99)

## 2021-12-30 LAB — MAGNESIUM: Magnesium: 1.9 mg/dL (ref 1.7–2.4)

## 2021-12-30 MED ORDER — SODIUM CHLORIDE 0.9% FLUSH
10.0000 mL | Freq: Two times a day (BID) | INTRAVENOUS | Status: DC
  Administered 2021-12-30 – 2022-01-02 (×6): 10 mL

## 2021-12-30 MED ORDER — TRAMADOL HCL 50 MG PO TABS
50.0000 mg | ORAL_TABLET | Freq: Once | ORAL | Status: AC
  Administered 2021-12-30: 50 mg via ORAL
  Filled 2021-12-30: qty 1

## 2021-12-30 MED ORDER — APIXABAN 5 MG PO TABS
10.0000 mg | ORAL_TABLET | Freq: Two times a day (BID) | ORAL | Status: DC
  Administered 2021-12-30 – 2022-01-03 (×9): 10 mg via ORAL
  Filled 2021-12-30 (×9): qty 2

## 2021-12-30 MED ORDER — PANTOPRAZOLE SODIUM 40 MG PO TBEC
40.0000 mg | DELAYED_RELEASE_TABLET | Freq: Two times a day (BID) | ORAL | Status: DC
  Administered 2021-12-30 – 2022-01-03 (×8): 40 mg via ORAL
  Filled 2021-12-30 (×8): qty 1

## 2021-12-30 MED ORDER — SODIUM CHLORIDE 0.9% FLUSH: 10.0000 mL | INTRAVENOUS | Status: DC | PRN

## 2021-12-30 MED ORDER — APIXABAN 5 MG PO TABS: 5.0000 mg | ORAL_TABLET | Freq: Two times a day (BID) | ORAL | Status: DC

## 2021-12-30 NOTE — Progress Notes (Signed)
AC made this nurse aware that vascular team was contacted and will be arriving to [place] today to pull PICC line and place mid line.

## 2021-12-30 NOTE — Progress Notes (Signed)
Informed by MD that patient needs to have PICC line removed due to [place] results. Consulted with charge nurse who instructed me to contact vascular services at [facility], I spoke with Zacarias Pontes RN who states we can remove PICC here and also stated that standard practice is to leave the PICC and treat the clot, MD made aware of both statements and still wants PICC line removed. This was then escalated to [facility] and Therapist, sports. Unit director informed me that she consulted with PICC line nurses and other colleagues, and it would be best to recommend PICC removal done under IR services. MD placed order for IR services and imaging, order discontinued by another MD. Primary MD contacted and made aware of my knowledge of the IR order being discontinued. MD states IR recommends vascular services pull PICC line, AC added to conversation.

## 2021-12-30 NOTE — Progress Notes (Signed)

## 2021-12-30 NOTE — Progress Notes (Signed)
Subjective: Patient reports continued lower abdominal pain and nausea, reporting the site of food makes her feel sick. States pain starts on the right side and travels to the left. She still has flexi seal in with continued watery stools. She does tell me she thinks she is ready to have further evaluation at [facility] regarding colectomy as she feels this may be her best option at this point for quality of life. She does not feel that fentyl for pain is helping much, stating is only lasts "14 minutes" when she gets it. Also noting some pain in R upper arm with swelling at PICC line site, she had [place] of this area this morning for further evaluation.   Objective: Vital signs in last 24 hours: Temp:  [98.2 F (36.8 C)-98.9 F (37.2 C)] 98.2 F (36.8 C) (08/30 0524) Pulse Rate:  [42-111] 101 (08/30 0524) Resp:  [15-18] 16 (08/30 0524) BP: (103-123)/(66-77) 108/75 (08/30 0524) SpO2:  [93 %-98 %] 94 % (08/30 0832) Weight:  [58.4 kg] 58.4 kg (08/29 1805) Last BM Date : 12/29/21 General:   Alert and oriented, pleasant Head:  Normocephalic and atraumatic. Eyes:  No icterus, sclera clear. Conjuctiva pink.  Mouth:  Without lesions, mucosa pink and moist.   Heart:  S1, S2 present, no murmurs noted.  Lungs: Clear to auscultation bilaterally, without wheezing, rales, or rhonchi.  Abdomen:  Bowel sounds present, soft, non-distended. MIld TTP of lower abdomen. No HSM or hernias noted. No rebound or guarding. No masses appreciated  Msk:  mild erythema and edema present around PICC line site in RUA Pulses:  Normal pulses noted. Extremities:  Without clubbing or edema. Neurologic:  Alert and  oriented x4;  grossly normal neurologically. Skin:  Warm and dry, intact without significant lesions.  Psych:  Alert and cooperative. Normal mood and affect.  Intake/Output from previous day: 08/29 0701 - 08/30 0700 In: 878.9 [I.V.:728.8; IV Piggyback:150.1] Out: 5900 [Urine:5700; Stool:200] Intake/Output this  shift: No intake/output data recorded.  Lab Results: Recent Labs    12/28/21 0359 12/29/21 0800 12/30/21 0342  WBC 9.0 10.6* 8.0  HGB 9.5* 10.9* 10.1*  HCT 29.1* 32.8* 31.0*  PLT 174 231 220   BMET Recent Labs    12/28/21 0359 12/29/21 0345 12/30/21 0342  NA 140 138 138  K 3.3* 3.8 4.0  CL 109 108 108  CO2 27 25 25   GLUCOSE 78 64* 65*  BUN 6 5* <5*  CREATININE 0.44 0.43* 0.54  CALCIUM 8.1* 8.2* 8.1*     Studies/Results: CT ABDOMEN PELVIS W CONTRAST  Result Date: 12/28/2021 CLINICAL DATA:  History of C diff. Now progressive ileus and distention. Bowel obstruction suspected. Evaluate for megacolon. Abdominal pain. Abnormal abdominal x-ray. History of Crohn's disease. EXAM: CT ABDOMEN AND PELVIS WITH CONTRAST TECHNIQUE: Multidetector CT imaging of the abdomen and pelvis was performed using the standard protocol following bolus administration of intravenous contrast. RADIATION DOSE REDUCTION: This exam was performed according to the departmental dose-optimization program which includes automated exposure control, adjustment of the mA and/or kV according to patient size and/or use of iterative reconstruction technique. CONTRAST:  16m OMNIPAQUE IOHEXOL 300 MG/ML  SOLN COMPARISON:  KUB 12/28/2021 and 12/23/2021; CT abdomen and pelvis 12/23/2021 FINDINGS: Lower chest: Heart size is again at the upper limits of normal. Mild to moderate pericardial effusion is unchanged. Hepatobiliary: Smooth liver contours. No focal liver mass is identified. Mildly decreased attenuation throughout the liver suggesting mild fatty infiltration. Cholecystectomy clips. No intrahepatic or extrahepatic biliary ductal dilatation.  Pancreas: No mass or inflammatory fat stranding. No pancreatic ductal dilatation is seen. Spleen: Normal in size without focal abnormality. Adrenals/Urinary Tract: Adrenal glands are unremarkable. The kidneys enhance uniformly and are symmetric in size without hydronephrosis. No renal  stone is seen. No renal mass is seen. The urinary bladder is decompressed by indwelling Foley catheter. Minimal associated air within the nondependent aspect of the urinary bladder. Stomach/Bowel: Rectal balloon catheter is again noted. The rectum and sigmoid colon are now decompressed with likely similar mild wall thickening again suggesting proctocolitis. Mild wall thickening of the proximal descending colon (axial series 2, image 41) appears new from prior. There is oral contrast seen as distal as the ileum. There are air-fluid levels again seen within the ascending, transverse, and proximal descending colon with fluid also seen within the more distal colon. No loss of haustra markings within the colon to specifically raise suspicion for toxic megacolon. No dilated loops of small bowel are seen. Vascular/Lymphatic: No abdominal aortic aneurysm. No mesenteric, retroperitoneal, or pelvic lymphadenopathy. Reproductive: The uterus is surgically absent. No gross adnexal abnormality. Other: No abdominal wall hernia or abnormality.Moderate subcutaneous fat edema within the bilateral lower flanks and buttocks. No pneumoperitoneum. Mild free fluid within the pelvis is increased from prior. Musculoskeletal: Minimal levocurvature centered at L3. Mild anterior L2-3 endplate osteophytes. IMPRESSION: Compared to 12/23/2021: 1. Redemonstration of wall thickening within the rectum and sigmoid colon suggesting proctocolitis. New mild wall thickening within a portion of the proximal descending colon. Air-fluid levels throughout the colon as can be seen with diarrheal illness. 2. No dilated loops of small bowel to indicate bowel obstruction. 3. Mildly increased mild free fluid within the pelvis. 4. Increased moderate bilateral flank and buttock subcutaneous fat edema. Electronically Signed   By: Yvonne Kendall M.D.   On: 12/28/2021 18:18   DG Abd Portable 2V  Result Date: 12/28/2021 CLINICAL DATA:  Abdominal pain EXAM: PORTABLE  ABDOMEN - 2 VIEW COMPARISON:  December 23, 2021 FINDINGS: Air-filled prominent loops of large and small bowel are identified. A loop of colon in the right upper quadrant is dilated up to 8.2 cm. The amount of dilatation is increased since December 23, 2021. No free air, portal venous gas, or pneumatosis. No other acute abnormalities. IMPRESSION: Air-filled dilated loops of large and small bowel most consistent with ileus. There is a loop of colon in the right upper quadrant measuring up to 8.2 cm. Recommend attention on follow-up. Electronically Signed   By: Dorise Bullion III M.D.   On: 12/28/2021 11:58    Assessment: [name] is a 52 year old female with a history of asthma, anxiety, PTSD, type 2 diabetes (not on medication), seizures, HTN, GERD, Crohn's colitis with history of stricturing (diagnosed at age 89) who presented to he ED via EMS after neighbor found patient unresponsive on bathroom floor. Admitted with severe sepsis in setting of recurrent Cdiff. CT with mild proctocolitis, mild colonic ileus. Cdiff antigen positive, toxin negative, PCR positive.    C diff: treated 2 months ago. Afebrile. WBC at 8 today. Flexiseal rectal tube in place with continued watery stools. Previous I/Os not documented well, recommend following this closely. Documented output of 254m stool over the past 12 hours with 4539mdocumented yesterday.  Worsening abdominal distention Monday with air filled dilated loops of large and small bowel on Xray, follow up CT A/P redemonstrated wall thickening of the rectum and sigmoid colon, new mild wall thickening and portion of proximal descending colon, no findings of ileus,  bowel obstruction, or suspicion of toxic megacolon. Should continue with vancomycin and flagyl. May need outpatient referral for FMT given recurrence of C diff.   Crohn's Colitis: on IV solu-medrol, transition to PO prednisone 41m daily once able to tolerate diet. Allergic reactions to both Humira and  Remicade in the past, should continue to avoid anti-TNF agents. She has had multiple [facility] Stelara versus surgical management, but care has been interrupted by multiple hospitalizations leading to missed appointments.  She needs to have streamlined care at one facility. discussion was held with patient and Dr. CJenetta Downeryesterday regarding importance of a dedicated team at a tertiary center managing her care. Patient tells me today she is ready to have evaluation for possible colectomy as she feels that surgery is her best option at this point.  [facility], Per chart review patient now [facility].  Right upper arm swelling: notably patient has swelling and pain to RUA where PICC line is located. UKoreathis morning with occlusive thrombus in right basilic vein surrounding indwelling PICC line. Non occlusive thrombus extending into subclavian vein surrounding PICC line as well.    Plan: Continue vancomycin and flagyl Continue IV solu-medrol until tolerating diet, can transition to oral prednisone at that time Maintain flexi seal Strict I/O documentation for reference of stool output Outpatient f/u for Crohn's with tertiary care center, [facility]   LOS: 7 days    12/30/2021, 9:18 AM   Aideen Fenster L. CAlver Sorrow MSN, APRN, AGNP-C Adult-Gerontology Nurse Practitioner [facility]

## 2021-12-30 NOTE — Progress Notes (Signed)
PROGRESS NOTE    [name]  JGG:[id number] DOB: 02-07-1971 DOA: 12/23/2021 PCP: Teodoro Kil, PA-C   Brief Narrative:  [name] is a 51 y.o. female with medical history significant for Crohn's disease, hypertension, diabetes, seizures. Patient was found unresponsive at home. She reports chronic diarrhea, up to 10 times a day.  Reports intermittent vomiting-Chronic.  She has been admitted with severe sepsis, present on admission secondary to C. difficile colitis with associated acute metabolic encephalopathy as well as AKI.  She is also thought to have a UTI and is on multiple antibiotics.  GI consulted with recommendations to transfer to tertiary care center, but no beds are currently available at multiple facilities.  She currently appears to be improving and does not need transfer at the moment. GI following and stool output diminishing, but still on CLD.  Assessment & Plan:   Principal Problem:   Severe sepsis ([place]) Active Problems:   Acute metabolic encephalopathy   Proctocolitis   AKI (acute kidney injury) ([place])   UTI (urinary tract infection)   COPD (chronic obstructive pulmonary disease) (HCC)   Crohn's disease ([place])   Diabetes mellitus without complication (HCC)   Lactic acidosis   Essential hypertension   MDD (major depressive disorder)   Seizure (HCC)   C. difficile colitis  Assessment and Plan:   Septic shock, present on admission, due to Cdiff colitis -Appreciate ongoing GI evaluation with CT abdomen and pelvis performed 8/28 with no signs of toxic megacolon noted -Continue now on oral vancomycin as well as IV Flagyl -Continue to monitor closely -Clear liquid diet and advance as tolerated -Overall stable heart rate and telemetry evaluation; will DC telemetry.   Chronic enterocolitis -Diffuse abdominal tenderness, continues to have large-volume diarrhea.  History of Crohn's disease, not on treatment.  Reported blood in stools.  Hemoglobin  stable. -History of Crohn's, appreciate GI evaluation with history of chronic diarrhea -Continue Solu-Medrol as ordered for now   Mild hypomagnesemia -Need to follow electrolytes and further replete as needed.   Acute metabolic encephalopathy-resolved Found down and unresponsive.  Mental status has improved in the ED.  Was likely down for 2 days. -Head CT with no acute findings -Overall mentation has improved and is pretty much back to her baseline.   E. coli UTI (urinary tract infection) -Changed to Bactrim 8/28 -ESBL   AKI (acute kidney injury)-resolved -Creatinine 1.58, baseline about 0.6-1.  Likely from dehydration, hypotension, sepsis. -Continue IV fluid and monitor due to ongoing liquid stool output -Continue to follow renal function trend -Improved overall.   Seizure ([place]) -Resumed Keppra -No seizure activity appreciated.   Essential hypertension-stable Initial hypotension systolic down to 54Y.  Likely from no oral intake in the past 2 days, and sepsis presentation. -Not on antihypertensives. -Vital signs stable currently.   Diabetes mellitus without complication ([place]) -Not on medication.  And currently not needing hypoglycemic agents. - HgbA1c 4.7% -Monitor CBG daily  Right upper extremity DVT -Erythema around PICC line with associated swelling and pain appreciated -Ultrasound evaluation demonstrating 2 blood clots -Orders for PICC line removal in place; patient has been started on Eliquis per pharmacy.   DVT prophylaxis:SCDs Code Status: Full Family Communication: No family at bedside. Disposition Plan:  Status is: Inpatient Remains inpatient appropriate because: Need for pressors and IV medications.   Consultants:  GI   Procedures:  None   Antimicrobials:  Anti-infectives (From admission, onward)    Start     Dose/Rate Route Frequency Ordered Stop   12/28/21 1000  sulfamethoxazole-trimethoprim (BACTRIM DS) 800-160 MG per tablet 1 tablet        1  tablet Oral Every 12 hours 12/28/21 0844     12/26/21 1100  meropenem (MERREM) 1 g in sodium chloride 0.9 % 100 mL IVPB  Status:  Discontinued        1 g 200 mL/hr over 30 Minutes Intravenous Every 8 hours 12/26/21 1013 12/28/21 0844   12/24/21 1600  metroNIDAZOLE (FLAGYL) IVPB 500 mg  Status:  Discontinued        500 mg 100 mL/hr over 60 Minutes Intravenous Every 12 hours 12/24/21 0729 12/24/21 1141   12/24/21 1230  vancomycin (VANCOCIN) capsule 500 mg        500 mg Oral Every 6 hours 12/24/21 1141 01/07/22 1159   12/24/21 1230  metroNIDAZOLE (FLAGYL) IVPB 500 mg        500 mg 100 mL/hr over 60 Minutes Intravenous Every 8 hours 12/24/21 1141 01/07/22 1229   12/24/21 0400  ceFEPIme (MAXIPIME) 2 g in sodium chloride 0.9 % 100 mL IVPB  Status:  Discontinued        2 g 200 mL/hr over 30 Minutes Intravenous Every 12 hours 12/23/21 1535 12/23/21 2224   12/24/21 0400  cefTRIAXone (ROCEPHIN) 2 g in sodium chloride 0.9 % 100 mL IVPB  Status:  Discontinued        2 g 200 mL/hr over 30 Minutes Intravenous Every 24 hours 12/23/21 2224 12/26/21 1013   12/24/21 0400  metroNIDAZOLE (FLAGYL) IVPB 500 mg        500 mg 100 mL/hr over 60 Minutes Intravenous  Once 12/23/21 2224 12/24/21 0430   12/23/21 1545  ceFEPIme (MAXIPIME) 2 g in sodium chloride 0.9 % 100 mL IVPB        2 g 200 mL/hr over 30 Minutes Intravenous  Once 12/23/21 1530 12/23/21 1615   12/23/21 1530  aztreonam (AZACTAM) 2 g in sodium chloride 0.9 % 100 mL IVPB  Status:  Discontinued        2 g 200 mL/hr over 30 Minutes Intravenous  Once 12/23/21 1523 12/23/21 1530   12/23/21 1530  metroNIDAZOLE (FLAGYL) IVPB 500 mg        500 mg 100 mL/hr over 60 Minutes Intravenous  Once 12/23/21 1523 12/23/21 1630      Subjective: Tolerating clear liquid diet; reported intermittent episode of nausea and abdominal pain.  No fever.  Stable vital signs.  Patient complaining of right upper extremity swelling and pain.  Objective: Vitals:   12/30/21  0206 12/30/21 0524 12/30/21 0832 12/30/21 1356  BP: 103/77 108/75  108/67  Pulse: (!) 42 (!) 101  (!) 104  Resp: 18 16  20   Temp: 98.2 F (36.8 C) 98.2 F (36.8 C)  98.3 F (36.8 C)  TempSrc: Oral   Oral  SpO2: 95% 94% 94% 94%  Weight:      Height:        Intake/Output Summary (Last 24 hours) at 12/30/2021 1850 Last data filed at 12/30/2021 1318 Gross per 24 hour  Intake 840 ml  Output 3500 ml  Net -2660 ml   Filed Weights   12/27/21 0500 12/29/21 0421 12/29/21 1805  Weight: 57.6 kg 59.3 kg 58.4 kg    Examination: General exam: Alert, awake, oriented x 3; reports intermittent abdominal discomfort and some ongoing nausea/dry heaves.  No fever, no chest pain.  Good saturation on room air. Respiratory system: Clear to auscultation.  No using accessory muscles. Cardiovascular system:RRR. No  murmurs, rubs, gallops. Gastrointestinal system: Abdomen is nondistended, soft and mildly tender to palpation (generalized). No organomegaly or masses felt. Normal bowel sounds heard. Central nervous system: Alert and oriented. No focal neurological deficits. Extremities: No cyanosis or clubbing.  Right upper extremity swelling and pain around PICC line appreciated on exam; positive surrounding erythema. Skin: No petechiae.  Flexi-Seal with liquid stool in place. Psychiatry: Judgement and insight appear normal.  Flat affect appreciated.     Data Reviewed: I have personally reviewed following labs and imaging studies  CBC: Recent Labs  Lab 12/26/21 0344 12/27/21 0414 12/28/21 0359 12/29/21 0800 12/30/21 0342  WBC 19.0* 10.4 9.0 10.6* 8.0  HGB 10.5* 9.8* 9.5* 10.9* 10.1*  HCT 31.7* 30.5* 29.1* 32.8* 31.0*  MCV 96.1 95.9 96.4 95.3 96.0  PLT 195 165 174 231 858   Basic Metabolic Panel: Recent Labs  Lab 12/26/21 0344 12/27/21 0414 12/28/21 0359 12/29/21 0345 12/30/21 0342  NA 139 136 140 138 138  K 3.8 3.4* 3.3* 3.8 4.0  CL 110 107 109 108 108  CO2 23 23 27 25 25   GLUCOSE 97  101* 78 64* 65*  BUN 12 10 6  5* <5*  CREATININE 0.53 0.50 0.44 0.43* 0.54  CALCIUM 8.0* 8.0* 8.1* 8.2* 8.1*  MG 2.5* 1.7 1.5* 1.6* 1.9   GFR: Estimated Creatinine Clearance: 71 mL/min (by C-G formula based on SCr of 0.54 mg/dL).  Liver Function Tests: Recent Labs  Lab 12/25/21 0600  AST 34  ALT 14  ALKPHOS 119  BILITOT 0.6  PROT 4.2*  ALBUMIN 1.7*   Cardiac Enzymes: Recent Labs  Lab 12/23/21 2343  CKTOTAL 23*   CBG: Recent Labs  Lab 12/25/21 1141 12/27/21 0433 12/28/21 0659 12/29/21 0521 12/30/21 0530  GLUCAP 106* 86 79 75 65*    Sepsis Labs: Recent Labs  Lab 12/25/21 0600  LATICACIDVEN 1.2    Recent Results (from the past 240 hour(s))  Gastrointestinal Panel by PCR , Stool     Status: Abnormal   Collection Time: 12/23/21 12:37 AM   Specimen: Stool  Result Value Ref Range Status   Campylobacter species NOT DETECTED NOT DETECTED Final   Plesimonas shigelloides NOT DETECTED NOT DETECTED Final   Salmonella species DETECTED (A) NOT DETECTED Final    Comment: RESULT CALLED TO, READ BACK BY AND VERIFIED WITH: C/CODY [facility] 12/24/21 1927    Yersinia enterocolitica NOT DETECTED NOT DETECTED Final   Vibrio species NOT DETECTED NOT DETECTED Final   Vibrio cholerae NOT DETECTED NOT DETECTED Final   Enteroaggregative E coli (EAEC) NOT DETECTED NOT DETECTED Final   Enteropathogenic E coli (EPEC) NOT DETECTED NOT DETECTED Final   Enterotoxigenic E coli (ETEC) NOT DETECTED NOT DETECTED Final   Shiga like toxin producing E coli (STEC) NOT DETECTED NOT DETECTED Final   Shigella/Enteroinvasive E coli (EIEC) NOT DETECTED NOT DETECTED Final   Cryptosporidium NOT DETECTED NOT DETECTED Final   Cyclospora cayetanensis NOT DETECTED NOT DETECTED Final   Entamoeba histolytica NOT DETECTED NOT DETECTED Final   Giardia lamblia NOT DETECTED NOT DETECTED Final   Adenovirus F40/41 NOT DETECTED NOT DETECTED Final   Astrovirus NOT DETECTED NOT DETECTED Final   Norovirus GI/GII NOT  DETECTED NOT DETECTED Final   Rotavirus A NOT DETECTED NOT DETECTED Final   Sapovirus (I, II, IV, and V) NOT DETECTED NOT DETECTED Final    Comment: Performed at [facility], [address] [place], [place] [address]  Blood Culture (routine x 2)  Status: None   Collection Time: 12/23/21  3:27 PM   Specimen: Leg; Blood  Result Value Ref Range Status   Specimen Description LEG  Final   Special Requests   Final    BOTTLES DRAWN AEROBIC AND ANAEROBIC Blood Culture results may not be optimal due to an excessive volume of blood received in culture bottles   Culture   Final    NO GROWTH 5 DAYS Performed at [facility], [address], [place], [place] [address]    Report Status 12/28/2021 FINAL  Final  Resp Panel by RT-PCR (Flu A&B, Covid) Anterior Nasal Swab     Status: None   Collection Time: 12/23/21  3:45 PM   Specimen: Anterior Nasal Swab  Result Value Ref Range Status   SARS Coronavirus 2 by RT PCR NEGATIVE NEGATIVE Final    Comment: (NOTE) SARS-CoV-2 target nucleic acids are NOT DETECTED.  The SARS-CoV-2 RNA is generally detectable in upper respiratory specimens during the acute phase of infection. The lowest concentration of SARS-CoV-2 viral copies this assay can detect is 138 copies/mL. A negative result does not preclude SARS-Cov-2 infection and should not be used as the sole basis for treatment or other patient management decisions. A negative result may occur with  improper specimen collection/handling, submission of specimen other than nasopharyngeal swab, presence of viral mutation(s) within the areas targeted by this assay, and inadequate number of viral copies(<138 copies/mL). A negative result must be combined with clinical observations, patient history, and epidemiological information. The expected result is Negative.  Fact Sheet for Patients:  EntrepreneurPulse.com.au  Fact Sheet for Healthcare Providers:   IncredibleEmployment.be  This test is no t yet approved or cleared by the [place] FDA and  has been authorized for detection and/or diagnosis of SARS-CoV-2 by FDA under an Emergency Use Authorization (EUA). This EUA will remain  in effect (meaning this test can be used) for the duration of the COVID-19 declaration under Section 564(b)(1) of the Act, 21 U.S.C.section 360bbb-3(b)(1), unless the authorization is terminated  or revoked sooner.       Influenza A by PCR NEGATIVE NEGATIVE Final   Influenza B by PCR NEGATIVE NEGATIVE Final    Comment: (NOTE) The Xpert Xpress SARS-CoV-2/FLU/RSV plus assay is intended as an aid in the diagnosis of influenza from Nasopharyngeal swab specimens and should not be used as a sole basis for treatment. Nasal washings and aspirates are unacceptable for Xpert Xpress SARS-CoV-2/FLU/RSV testing.  Fact Sheet for Patients: EntrepreneurPulse.com.au  Fact Sheet for Healthcare Providers: IncredibleEmployment.be  This test is not yet approved or cleared by the [place] FDA and has been authorized for detection and/or diagnosis of SARS-CoV-2 by FDA under an Emergency Use Authorization (EUA). This EUA will remain in effect (meaning this test can be used) for the duration of the COVID-19 declaration under Section 564(b)(1) of the Act, 21 U.S.C. section 360bbb-3(b)(1), unless the authorization is terminated or revoked.  Performed at [facility], [address] [place], [place] [address]   Urine Culture     Status: Abnormal   Collection Time: 12/23/21  4:41 PM   Specimen: In/Out Cath Urine  Result Value Ref Range Status   Specimen Description   Final    IN/OUT CATH URINE Performed at [facility], [address] [place], [place] [address]    Special Requests   Final    NONE Performed at [facility], [address], [place], [place] [address]    Culture (A)  Final    >=  100,000  COLONIES/mL ESCHERICHIA COLI Susceptibility Pattern Suggests Possibility of an Extended Spectrum Beta Lactamase Producer. Contact Laboratory Within 7 Days if Confirmation Warranted. Performed at [facility], [place] [address], [place], [place] [address]    Report Status 12/26/2021 FINAL  Final   Organism ID, Bacteria ESCHERICHIA COLI (A)  Final      Susceptibility   Escherichia coli - MIC*    AMPICILLIN >=32 RESISTANT Resistant     CEFAZOLIN >=64 RESISTANT Resistant     CEFEPIME 16 RESISTANT Resistant     CEFTRIAXONE >=64 RESISTANT Resistant     CIPROFLOXACIN >=4 RESISTANT Resistant     GENTAMICIN <=1 SENSITIVE Sensitive     IMIPENEM <=0.25 SENSITIVE Sensitive     NITROFURANTOIN <=16 SENSITIVE Sensitive     TRIMETH/SULFA <=20 SENSITIVE Sensitive     AMPICILLIN/SULBACTAM >=32 RESISTANT Resistant     PIP/TAZO 16 SENSITIVE Sensitive     * >=100,000 COLONIES/mL ESCHERICHIA COLI  MRSA Next Gen by PCR, Nasal     Status: None   Collection Time: 12/23/21 11:01 PM   Specimen: Nasal Mucosa; Nasal Swab  Result Value Ref Range Status   MRSA by PCR Next Gen NOT DETECTED NOT DETECTED Final    Comment: (NOTE) The GeneXpert MRSA Assay (FDA approved for NASAL specimens only), is one component of a comprehensive MRSA colonization surveillance program. It is not intended to diagnose MRSA infection nor to guide or monitor treatment for MRSA infections. Test performance is not FDA approved in patients less than 33 years old. Performed at [facility], [address] [place], [place] [address]   C Difficile Quick Screen w PCR reflex     Status: Abnormal   Collection Time: 12/24/21 12:24 AM   Specimen: Stool  Result Value Ref Range Status   C Diff antigen POSITIVE (A) NEGATIVE Final   C Diff toxin NEGATIVE NEGATIVE Final   C Diff interpretation Results are indeterminate. See PCR results.  Final    Comment: Performed at [facility], [address], [place], [place] [address]  C. Diff by PCR,  Reflexed     Status: Abnormal   Collection Time: 12/24/21 12:24 AM  Result Value Ref Range Status   Toxigenic C. Difficile by PCR POSITIVE (A) NEGATIVE Final    Comment: Positive for toxigenic C. difficile with little to no toxin production. Only treat if clinical presentation suggests symptomatic illness. Performed at [facility], [place] [address] [place], [place] [address]     Radiology Studies: US Venous Img Upper Uni Right(DVT)  Result Date: 12/30/2021 CLINICAL DATA:  Right arm swelling for 3 days, indwelling PICC line EXAM: RIGHT UPPER EXTREMITY VENOUS DOPPLER ULTRASOUND TECHNIQUE: Gray-scale sonography with graded compression, as well as color Doppler and duplex ultrasound were performed to evaluate the upper extremity deep venous system from the level of the subclavian vein and including the jugular, axillary, basilic, radial, ulnar and upper cephalic vein. Spectral Doppler was utilized to evaluate flow at rest and with distal augmentation maneuvers. COMPARISON:  None Available. FINDINGS: Contralateral Subclavian Vein: Respiratory phasicity is normal and symmetric with the symptomatic side. No evidence of thrombus. Normal compressibility. Internal Jugular Vein: No evidence of thrombus. Normal compressibility, respiratory phasicity and response to augmentation. Subclavian Vein: There is an indwelling PICC line. Echogenic nonocclusive thrombus is seen within the subclavian vein surrounding the PICC line. Minimal color flow is detected. Axillary Vein: No evidence of thrombus. Normal compressibility, respiratory phasicity and response to augmentation. Cephalic Vein: No evidence  of thrombus. Normal compressibility, respiratory phasicity and response to augmentation. Basilic Vein: Indwelling PICC line is identified. There is echogenic occlusive thrombus within the basilic vein surrounding the PICC line. No Doppler flow detected. Brachial Veins: No evidence of thrombus. Normal compressibility,  respiratory phasicity and response to augmentation. Radial Veins: No evidence of thrombus. Normal compressibility, respiratory phasicity and response to augmentation. Ulnar Veins: No evidence of thrombus. Normal compressibility, respiratory phasicity and response to augmentation. Other Findings:  None visualized. IMPRESSION: 1. Occlusive thrombus within the right basilic vein surrounding the indwelling PICC line. There is nonocclusive thrombus extending into the subclavian vein surrounding the PICC line as well. These results will be called to the ordering clinician or representative by the Radiologist Assistant, and communication documented in the PACS or [employer]. Electronically Signed   By: Randa Ngo M.D.   On: 12/30/2021 10:53    Scheduled Meds:  amitriptyline  100 mg Oral QHS   apixaban  10 mg Oral BID   Followed by   Derrill Memo ON 01/06/2022] apixaban  5 mg Oral BID   Chlorhexidine Gluconate Cloth  6 each Topical Daily   levETIRAcetam  750 mg Oral BID   methylPREDNISolone (SOLU-MEDROL) injection  60 mg Intravenous Daily   pantoprazole  40 mg Oral BID   potassium chloride  40 mEq Oral BID   sodium chloride flush  10-40 mL Intracatheter Q12H   sulfamethoxazole-trimethoprim  1 tablet Oral Q12H   umeclidinium bromide  1 puff Inhalation Daily   vancomycin  500 mg Oral Q6H   Continuous Infusions:  lactated ringers 75 mL/hr at 12/29/21 1923   metronidazole 500 mg (12/30/21 1223)   norepinephrine (LEVOPHED) Adult infusion Stopped (12/25/21 1324)   promethazine (PHENERGAN) injection (IM or IVPB) 12.5 mg (12/29/21 1926)   vasopressin Stopped (12/25/21 0927)     LOS: 7 days    Roque Lias [facility]  If 7PM-7AM, please contact night-coverage [email or web address] 12/30/2021, 6:50 PM

## 2021-12-30 NOTE — Progress Notes (Signed)
Spoke with primary RN about PICC line removal order. Recommendation is to treat the blood clot and leave PICC in place however if MD rather have the  PICC line removed then staff on that campus can remove the line as they normally do. Primary RN to discuss this with the MD.

## 2021-12-30 NOTE — Progress Notes (Addendum)
Pt right arm noted to be swollen. TL PICC to right upper arm, was placed on 8/27. IV fluids stopped. Dr. Orlin Hilding notified around 2254. NNO at this time No redness or warmth present to right arm.

## 2021-12-30 NOTE — Progress Notes (Signed)
ANTICOAGULATION CONSULT NOTE - Initial Consult  Pharmacy Consult for apixaban Indication: DVT  Allergies  Allergen Reactions   Codeine Shortness Of Breath, Swelling and Rash    Throat swelling   Dilaudid [Hydromorphone Hcl] Shortness Of Breath and Swelling   Hydrocodone Shortness Of Breath, Swelling and Rash   Morphine And Related Shortness Of Breath, Swelling and Rash   Oxycodone Shortness Of Breath, Swelling and Rash    Patient states ALL pain medications make her deathly sick.   Penicillins Shortness Of Breath, Swelling and Other (See Comments)    Has patient had a PCN reaction causing immediate rash, facial/tongue/throat swelling, SOB or lightheadedness with hypotension: Yes Has patient had a PCN reaction causing severe rash involving mucus membranes or skin necrosis: Yes Has patient had a PCN reaction that required hospitalization Yes Has patient had a PCN reaction occurring within the last 10 years: No If all of the above answers are "NO", then may proceed with Cephalosporin use.   Throat swells  02/06/21--TOLERATES CEFTRIAXONE     Acetaminophen     Per MD patient states she can't take Tylenol because of liver enzymes   Ativan [Lorazepam] Other (See Comments)    Migraines.   Humira [Adalimumab]     rash   Strawberry Extract Swelling   Watermelon Concentrate Swelling   Albuterol Palpitations   Benadryl [Diphenhydramine Hcl] Palpitations   Latex Rash   Remicade [Infliximab] Rash    Blisters and Welts     Patient Measurements: Height: 5' 4"  (162.6 cm) Weight: 58.4 kg (128 lb 12 oz) IBW/kg (Calculated) : 54.7 Heparin Dosing Weight:   Vital Signs: Temp: 98.2 F (36.8 C) (08/30 0524) Temp Source: Oral (08/30 0206) BP: 108/75 (08/30 0524) Pulse Rate: 101 (08/30 0524)  Labs: Recent Labs    12/28/21 0359 12/29/21 0345 12/29/21 0800 12/30/21 0342  HGB 9.5*  --  10.9* 10.1*  HCT 29.1*  --  32.8* 31.0*  PLT 174  --  231 220  CREATININE 0.44 0.43*  --  0.54     Estimated Creatinine Clearance: 71 mL/min (by C-G formula based on SCr of 0.54 mg/dL).   Medical History: Past Medical History:  Diagnosis Date   Anxiety    Asthma    Chronic low back pain    Collagen vascular disease (HCC)    COPD (chronic obstructive pulmonary disease) (HCC)    Crohn's disease (HCC)    GERD (gastroesophageal reflux disease)    EGD 01/2007 by Dr.Rourke small hiatal hernia s/p 76 french maloney    History of head injury    Hypertension    IBS (irritable bowel syndrome)    PSVT (paroxysmal supraventricular tachycardia) (HCC)    PTSD (post-traumatic stress disorder)    Recurrent chest pain    Seizure disorder (HCC)    Type 2 diabetes mellitus (HCC)     Medications:  Medications Prior to Admission  Medication Sig Dispense Refill Last Dose   albuterol (VENTOLIN HFA) 108 (90 Base) MCG/ACT inhaler Inhale 2 puffs into the lungs every 4 (four) hours as needed for wheezing or shortness of breath. 18 g 1    ALPRAZolam (XANAX) 1 MG tablet Take 1 mg by mouth 3 (three) times daily as needed for anxiety.      amitriptyline (ELAVIL) 75 MG tablet Take 100 mg by mouth at bedtime.      cefdinir (OMNICEF) 300 MG capsule Take 1 capsule (300 mg total) by mouth every 12 (twelve) hours. 10 capsule 0    cyclobenzaprine (  FLEXERIL) 10 MG tablet Take 10 mg by mouth 3 (three) times daily as needed for muscle spasms.      DULoxetine (CYMBALTA) 30 MG capsule Take 30 mg by mouth daily.      folic acid (FOLVITE) 1 MG tablet Take 1 tablet (1 mg total) by mouth daily.      hydrOXYzine (ATARAX) 25 MG tablet Take 25 mg by mouth 3 (three) times daily as needed for anxiety.      levETIRAcetam (KEPPRA) 750 MG tablet Take 750 mg by mouth 2 (two) times daily.      nitroGLYCERIN (NITROSTAT) 0.4 MG SL tablet Place 1 tablet (0.4 mg total) under the tongue every 5 (five) minutes as needed for chest pain. 100 tablet 0    omeprazole (PRILOSEC) 40 MG capsule Take 40 mg by mouth daily.      ondansetron  (ZOFRAN-ODT) 4 MG disintegrating tablet Take 4 mg by mouth every 8 (eight) hours as needed for nausea or vomiting.      predniSONE (DELTASONE) 10 MG tablet Take by mouth. Take 4 tablets (40 mg total) by mouth once daily for 5 days, THEN 3 tablets (30 mg total) once daily for 7 days, THEN 2 tablets (20 mg total) once daily for 7 days, THEN 1 tablet (10 mg total) once daily for 7 days      RESTASIS 0.05 % ophthalmic emulsion Place 1 drop into both eyes 2 (two) times daily.      SPIRIVA RESPIMAT 2.5 MCG/ACT AERS Inhale 1 puff into the lungs daily as needed (shortness of breath). (Patient taking differently: Inhale 1 puff into the lungs daily.) 4 g 1    triamcinolone cream (KENALOG) 0.1 % Apply topically 2 (two) times daily. 30 g 1    Vitamin D, Ergocalciferol, (DRISDOL) 1.25 MG (50000 UNIT) CAPS capsule Take 50,000 Units by mouth once a week.       Assessment: Pharmacy consulted to dose apixaban in patient with RUE DVT.  Patient is not on anticoagulation prior to admission.  Hgb 10.1   Monitor platelets by anticoagulation protocol: Yes   Plan:  Apixaban 10 mg twice daily x 7 days followed by apixaban 5 mg twice daily. Monitor H&H and s/s of bleeding.  Ramond Craver 12/30/2021,12:28 PM

## 2021-12-31 DIAGNOSIS — R6520 Severe sepsis without septic shock: Secondary | ICD-10-CM | POA: Diagnosis not present

## 2021-12-31 DIAGNOSIS — N179 Acute kidney failure, unspecified: Secondary | ICD-10-CM | POA: Diagnosis not present

## 2021-12-31 DIAGNOSIS — A0472 Enterocolitis due to Clostridium difficile, not specified as recurrent: Secondary | ICD-10-CM | POA: Diagnosis not present

## 2021-12-31 DIAGNOSIS — A419 Sepsis, unspecified organism: Secondary | ICD-10-CM | POA: Diagnosis not present

## 2021-12-31 DIAGNOSIS — G9341 Metabolic encephalopathy: Secondary | ICD-10-CM | POA: Diagnosis not present

## 2021-12-31 LAB — ZINC: Zinc: 12 ug/dL — ABNORMAL LOW (ref 44–115)

## 2021-12-31 LAB — GLUCOSE, CAPILLARY: Glucose-Capillary: 76 mg/dL (ref 70–99)

## 2021-12-31 MED ORDER — FENTANYL CITRATE PF 50 MCG/ML IJ SOSY
25.0000 ug | PREFILLED_SYRINGE | INTRAMUSCULAR | Status: DC | PRN
  Administered 2021-12-31 – 2022-01-03 (×11): 25 ug via INTRAVENOUS
  Filled 2021-12-31 (×11): qty 1

## 2021-12-31 MED ORDER — TRAMADOL HCL 50 MG PO TABS
50.0000 mg | ORAL_TABLET | Freq: Three times a day (TID) | ORAL | Status: DC | PRN
  Administered 2021-12-31 – 2022-01-01 (×2): 50 mg via ORAL
  Filled 2021-12-31 (×2): qty 1

## 2021-12-31 NOTE — Progress Notes (Signed)
Pt is asking if she can wait until tomorrow to remove rectal tube so her family can bring pull ups, she states " I dont want a mess all over myself." Notified GI

## 2021-12-31 NOTE — Progress Notes (Signed)
Gastroenterology Progress Note   Referring Provider: No ref. provider found Primary Care Physician:  Teodoro Kil, PA-C Primary Gastroenterologist:  Dr. Jenetta Downer  Patient ID: [name]; [id number]; 18-Feb-1971   Subjective:    States her abdominal pain is not better. She states fentanyl provides only 14 minutes of relief. She feels like she needs more frequent dosing. Decided she wants surgery. Continues to have abdominal pain. Feels tight. Rectal pain and cramping she feels is worse since rectal tube placed but does not feel she can go without it due to fecal incontinence due to urgency and overall weakness. No vomiting.    Objective:   Vital signs in last 24 hours: Temp:  [98 F (36.7 C)-99.2 F (37.3 C)] 98 F (36.7 C) (08/31 0502) Pulse Rate:  [93-104] 99 (08/31 0502) Resp:  [19-20] 19 (08/31 0502) BP: (108-134)/(67-88) 116/69 (08/31 0502) SpO2:  [94 %-95 %] 94 % (08/31 0821) Last BM Date : 12/30/21 General:   Alert,  Well-developed, well-nourished, pleasant and cooperative in NAD. Appears much better than last week when I saw her. She is more clear and less confused.  Head:  Normocephalic and atraumatic. Eyes:  Sclera clear, no icterus.  Abdomen:  Soft, mild distention. Mild lower abdominal tenderness. Normal bowel sounds, without guarding, and without rebound.  Rectal tube with 500cc brown liquid stool present in bag at this time. 200 cc recorded for last 24 hours.  Extremities:  Without clubbing, deformity or edema. Neurologic:  Alert and  oriented x4;  grossly normal neurologically. Skin:  Intact without significant lesions or rashes. Psych:  Alert and cooperative. Normal mood and affect.  Intake/Output from previous day: 08/30 0701 - 08/31 0700 In: 1767.1 [P.O.:1320; I.V.:247.1; IV Piggyback:200] Out: 5697 [Urine:3650] Intake/Output this shift: No intake/output data recorded.  Lab Results: CBC Recent Labs    12/29/21 0800 12/30/21 0342  WBC 10.6* 8.0   HGB 10.9* 10.1*  HCT 32.8* 31.0*  MCV 95.3 96.0  PLT 231 220   BMET Recent Labs    12/29/21 0345 12/30/21 0342  NA 138 138  K 3.8 4.0  CL 108 108  CO2 25 25  GLUCOSE 64* 65*  BUN 5* <5*  CREATININE 0.43* 0.54  CALCIUM 8.2* 8.1*   LFTs No results for input(s): "BILITOT", "BILIDIR", "IBILI", "ALKPHOS", "AST", "ALT", "PROT", "ALBUMIN" in the last 72 hours. No results for input(s): "LIPASE" in the last 72 hours. PT/INR No results for input(s): "LABPROT", "INR" in the last 72 hours.       Imaging Studies: US Venous Img Upper Uni Right(DVT)  Result Date: 12/30/2021 CLINICAL DATA:  Right arm swelling for 3 days, indwelling PICC line EXAM: RIGHT UPPER EXTREMITY VENOUS DOPPLER ULTRASOUND TECHNIQUE: Gray-scale sonography with graded compression, as well as color Doppler and duplex ultrasound were performed to evaluate the upper extremity deep venous system from the level of the subclavian vein and including the jugular, axillary, basilic, radial, ulnar and upper cephalic vein. Spectral Doppler was utilized to evaluate flow at rest and with distal augmentation maneuvers. COMPARISON:  None Available. FINDINGS: Contralateral Subclavian Vein: Respiratory phasicity is normal and symmetric with the symptomatic side. No evidence of thrombus. Normal compressibility. Internal Jugular Vein: No evidence of thrombus. Normal compressibility, respiratory phasicity and response to augmentation. Subclavian Vein: There is an indwelling PICC line. Echogenic nonocclusive thrombus is seen within the subclavian vein surrounding the PICC line. Minimal color flow is detected. Axillary Vein: No evidence of thrombus. Normal compressibility, respiratory phasicity and response to augmentation. Cephalic  Vein: No evidence of thrombus. Normal compressibility, respiratory phasicity and response to augmentation. Basilic Vein: Indwelling PICC line is identified. There is echogenic occlusive thrombus within the basilic vein  surrounding the PICC line. No Doppler flow detected. Brachial Veins: No evidence of thrombus. Normal compressibility, respiratory phasicity and response to augmentation. Radial Veins: No evidence of thrombus. Normal compressibility, respiratory phasicity and response to augmentation. Ulnar Veins: No evidence of thrombus. Normal compressibility, respiratory phasicity and response to augmentation. Other Findings:  None visualized. IMPRESSION: 1. Occlusive thrombus within the right basilic vein surrounding the indwelling PICC line. There is nonocclusive thrombus extending into the subclavian vein surrounding the PICC line as well. These results will be called to the ordering clinician or representative by the Radiologist Assistant, and communication documented in the PACS or [employer]. Electronically Signed   By: Randa Ngo M.D.   On: 12/30/2021 10:53   CT ABDOMEN PELVIS W CONTRAST  Result Date: 12/28/2021 CLINICAL DATA:  History of C diff. Now progressive ileus and distention. Bowel obstruction suspected. Evaluate for megacolon. Abdominal pain. Abnormal abdominal x-ray. History of Crohn's disease. EXAM: CT ABDOMEN AND PELVIS WITH CONTRAST TECHNIQUE: Multidetector CT imaging of the abdomen and pelvis was performed using the standard protocol following bolus administration of intravenous contrast. RADIATION DOSE REDUCTION: This exam was performed according to the departmental dose-optimization program which includes automated exposure control, adjustment of the mA and/or kV according to patient size and/or use of iterative reconstruction technique. CONTRAST:  112m OMNIPAQUE IOHEXOL 300 MG/ML  SOLN COMPARISON:  KUB 12/28/2021 and 12/23/2021; CT abdomen and pelvis 12/23/2021 FINDINGS: Lower chest: Heart size is again at the upper limits of normal. Mild to moderate pericardial effusion is unchanged. Hepatobiliary: Smooth liver contours. No focal liver mass is identified. Mildly decreased attenuation  throughout the liver suggesting mild fatty infiltration. Cholecystectomy clips. No intrahepatic or extrahepatic biliary ductal dilatation. Pancreas: No mass or inflammatory fat stranding. No pancreatic ductal dilatation is seen. Spleen: Normal in size without focal abnormality. Adrenals/Urinary Tract: Adrenal glands are unremarkable. The kidneys enhance uniformly and are symmetric in size without hydronephrosis. No renal stone is seen. No renal mass is seen. The urinary bladder is decompressed by indwelling Foley catheter. Minimal associated air within the nondependent aspect of the urinary bladder. Stomach/Bowel: Rectal balloon catheter is again noted. The rectum and sigmoid colon are now decompressed with likely similar mild wall thickening again suggesting proctocolitis. Mild wall thickening of the proximal descending colon (axial series 2, image 41) appears new from prior. There is oral contrast seen as distal as the ileum. There are air-fluid levels again seen within the ascending, transverse, and proximal descending colon with fluid also seen within the more distal colon. No loss of haustra markings within the colon to specifically raise suspicion for toxic megacolon. No dilated loops of small bowel are seen. Vascular/Lymphatic: No abdominal aortic aneurysm. No mesenteric, retroperitoneal, or pelvic lymphadenopathy. Reproductive: The uterus is surgically absent. No gross adnexal abnormality. Other: No abdominal wall hernia or abnormality.Moderate subcutaneous fat edema within the bilateral lower flanks and buttocks. No pneumoperitoneum. Mild free fluid within the pelvis is increased from prior. Musculoskeletal: Minimal levocurvature centered at L3. Mild anterior L2-3 endplate osteophytes. IMPRESSION: Compared to 12/23/2021: 1. Redemonstration of wall thickening within the rectum and sigmoid colon suggesting proctocolitis. New mild wall thickening within a portion of the proximal descending colon. Air-fluid  levels throughout the colon as can be seen with diarrheal illness. 2. No dilated loops of small bowel to indicate bowel obstruction.  3. Mildly increased mild free fluid within the pelvis. 4. Increased moderate bilateral flank and buttock subcutaneous fat edema. Electronically Signed   By: Yvonne Kendall M.D.   On: 12/28/2021 18:18   DG Abd Portable 2V  Result Date: 12/28/2021 CLINICAL DATA:  Abdominal pain EXAM: PORTABLE ABDOMEN - 2 VIEW COMPARISON:  December 23, 2021 FINDINGS: Air-filled prominent loops of large and small bowel are identified. A loop of colon in the right upper quadrant is dilated up to 8.2 cm. The amount of dilatation is increased since December 23, 2021. No free air, portal venous gas, or pneumatosis. No other acute abnormalities. IMPRESSION: Air-filled dilated loops of large and small bowel most consistent with ileus. There is a loop of colon in the right upper quadrant measuring up to 8.2 cm. Recommend attention on follow-up. Electronically Signed   By: Dorise Bullion III M.D.   On: 12/28/2021 11:58   [place] EKG SITE RITE  Result Date: 12/24/2021 If Site Rite image not attached, placement could not be confirmed due to current cardiac rhythm.  CT HEAD WO CONTRAST (5MM)  Result Date: 12/23/2021 CLINICAL DATA:  Mental status change, unknown cause Falls, AMS, hypotension. EXAM: CT HEAD WITHOUT CONTRAST TECHNIQUE: Contiguous axial images were obtained from the base of the skull through the vertex without intravenous contrast. RADIATION DOSE REDUCTION: This exam was performed according to the departmental dose-optimization program which includes automated exposure control, adjustment of the mA and/or kV according to patient size and/or use of iterative reconstruction technique. COMPARISON:  None Available. FINDINGS: Brain: Normal anatomic configuration. No abnormal intra or extra-axial mass lesion or fluid collection. No abnormal mass effect or midline shift. No evidence of acute intracranial  hemorrhage or infarct. Ventricular size is normal. Cerebellum unremarkable. Vascular: Unremarkable Skull: Intact Sinuses/Orbits: Paranasal sinuses are clear. Orbits are unremarkable. Other: Mastoid air cells and middle ear cavities are clear. IMPRESSION: No acute intracranial abnormality. Electronically Signed   By: Fidela Salisbury M.D.   On: 12/23/2021 21:58   CT ABDOMEN PELVIS W CONTRAST  Result Date: 12/23/2021 CLINICAL DATA:  Abdominal pain and bloating for 1 week. Coffee-ground emesis. EXAM: CT ABDOMEN AND PELVIS WITH CONTRAST TECHNIQUE: Multidetector CT imaging of the abdomen and pelvis was performed using the standard protocol following bolus administration of intravenous contrast. RADIATION DOSE REDUCTION: This exam was performed according to the departmental dose-optimization program which includes automated exposure control, adjustment of the mA and/or kV according to patient size and/or use of iterative reconstruction technique. CONTRAST:  56m OMNIPAQUE IOHEXOL 300 MG/ML  SOLN COMPARISON:  10/24/2021 FINDINGS: Lower Chest: Stable small pericardial effusion. Hepatobiliary: No hepatic masses identified. Prior cholecystectomy. No evidence of biliary obstruction. Pancreas:  No mass or inflammatory changes. Spleen: Within normal limits in size and appearance. Adrenals/Urinary Tract: No masses identified. No evidence of ureteral calculi or hydronephrosis. Foley catheter is seen within the bladder. Stomach/Bowel: Air-fluid levels are seen throughout the colon which is mildly distended, consistent with ileus. Mild wall thickening is again seen involving the rectum and distal sigmoid colon with mild perirectal inflammatory changes, consistent with proctocolitis. No evidence of abscess or bowel obstruction. Vascular/Lymphatic: No pathologically enlarged lymph nodes. No acute vascular findings. Right femoral central venous catheter in appropriate position. Reproductive: Prior hysterectomy noted. Adnexal regions  are unremarkable in appearance. Other: Soft tissue gas noted in the right inguinal region and abductor muscles of the thigh, likely related to the right femoral venous catheter placement. Musculoskeletal:  No suspicious bone lesions identified. IMPRESSION: Mild proctocolitis involving the  rectosigmoid, without significant change. No evidence of abscess or other complication. Mild colonic ileus. Stable small pericardial effusion. Electronically Signed   By: Marlaine Hind M.D.   On: 12/23/2021 19:08   DG Chest Port 1 View  Result Date: 12/23/2021 CLINICAL DATA:  Found unresponsive. EXAM: PORTABLE CHEST 1 VIEW COMPARISON:  March 20, 2021 FINDINGS: The heart size and mediastinal contours are within normal limits. Both lungs are clear. The visualized skeletal structures are unremarkable. IMPRESSION: No active disease. Electronically Signed   By: Virgina Norfolk M.D.   On: 12/23/2021 17:18   DG Abdomen 1 View  Result Date: 12/23/2021 CLINICAL DATA:  Central line placement EXAM: ABDOMEN - 1 VIEW COMPARISON:  None Available. FINDINGS: Right femoral central line is in place with the tip in the expected location of the common iliac vein. Bowel gas pattern is unremarkable. IMPRESSION: Central line on the right at the expected location of the common iliac vein. Electronically Signed   By: Nelson Chimes M.D.   On: 12/23/2021 15:36  [2 weeks]  Assessment:   51 year old female with asthma, anxiety, PTSD, type 2 diabetes (not on medication), seizures, hypertension, GERD, Crohn's colitis with history of stricturing (diagnosed age 10) who presented to the ED via EMS after neighbor found patient to be unresponsive on the bathroom floor.  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  CT with mild proctocolitis, mild colonic ileus.  C. difficile antigen positive, toxin negative, PCR positive.  C. difficile: Treated 2 months ago. Leukocytosis resolved. Rectal tube in place with 200cc brown watery  stool in the last 24 hours recorded. Patient reports ongoing lower abdominal pain and rectal discomfort which started after the tube was placed. Feels like her colon has spasms like dry heaves. Worsening abdominal distention Monday with air filled dilated loops of large and small bowel on Xray, follow up CT A/P redemonstrated wall thickening of the rectum and sigmoid colon, new mild wall thickening and portion of proximal descending colon, no findings of ileus, bowel obstruction, or suspicion of toxic megacolon. Should continue with vancomycin and flagyl. May need outpatient referral for FMT given recurrence of C diff.  Crohn's colitis: Complicating case with history of stricturing and allergic reactions to both Humira and Remicade, requiring avoiding anti-TNF agents.  Patient has had multiple hospitalizations for her IBD, most recently seen at the IBD clinic at [place] by Dr. Nyoka Cowden and in process of work-up to initiate Stelara versus surgical management, but care has been interrupted by multiple hospitalizations leading to missed appointments. She has been seen by [facility] while inpatient most recently. She also has a pending appointment with Dr. Carlos Levering on 01/18/22 at [place] next month, new patient appointment. Current follow up with Dr. Eduard Roux at [facility] is on 01/21/22 but attempting to have moved up. Patient desires considering surgical intervention at this point and wants to follow up at [facility]   Right upper arm swelling: Ultrasound showed occlusive thrombus in the right basilic vein surrounding indwelling PICC line.  Nonocclusive thrombus extending into the subclavian vein surrounding the PICC line as well. PICC has been removed and midline placed.     Plan:   Continue vancomycin and Flagyl. Continue IV Solu-Medrol, transition to oral prednisone when tolerating diet. Strict I's and O's in reference to stool output. Outpatient follow-up for Crohn's with tertiary care  center, has an appointment September 21 with Dr. Eduard Roux at [facility].  Outpatient consideration for FMT.  LOS: 8 days   Laureen Ochs. Bernarda Caffey [facility] [phone] 8/31/20238:25 AM

## 2021-12-31 NOTE — Progress Notes (Signed)
PROGRESS NOTE    [name]  QQV:[id number] DOB: 03/25/1971 DOA: 12/23/2021 PCP: Teodoro Kil, PA-C   Brief Narrative:  [name] is a 51 y.o. female with medical history significant for Crohn's disease, hypertension, diabetes, seizures. Patient was found unresponsive at home. She reports chronic diarrhea, up to 10 times a day.  Reports intermittent vomiting-Chronic.  She has been admitted with severe sepsis, present on admission secondary to C. difficile colitis with associated acute metabolic encephalopathy as well as AKI.  She is also thought to have a UTI and is on multiple antibiotics.  GI consulted with recommendations to transfer to tertiary care center, but no beds are currently available at multiple facilities.  She currently appears to be improving and does not need transfer at the moment. GI following and stool output diminishing, but still on CLD.  Assessment & Plan:   Principal Problem:   Severe sepsis ([place]) Active Problems:   Acute metabolic encephalopathy   Proctocolitis   AKI (acute kidney injury) ([place])   UTI (urinary tract infection)   COPD (chronic obstructive pulmonary disease) (HCC)   Crohn's disease ([place])   Diabetes mellitus without complication (HCC)   Lactic acidosis   Essential hypertension   MDD (major depressive disorder)   Seizure (HCC)   C. difficile colitis  Assessment and Plan:  Septic shock, present on admission, due to Cdiff colitis -Appreciate ongoing GI evaluation with CT abdomen and pelvis performed 8/28 with no signs of toxic megacolon noted -Continue now on oral vancomycin as well as IV Flagyl -Continue to monitor closely -Clear liquid diet and advance as tolerated -Overall stable heart rate and telemetry evaluation -Discontinue telemetry.   Chronic enterocolitis -Diffuse abdominal tenderness, continues to have large-volume diarrhea.  History of Crohn's disease, not on treatment.  Reported blood in stools.  Hemoglobin  stable. -History of Crohn's, appreciate GI evaluation with history of chronic diarrhea -Continue Solu-Medrol as ordered for now -As needed tramadol has been added.   Mild hypomagnesemia -Need to follow electrolytes and further replete as needed.   Acute metabolic encephalopathy-resolved Found down and unresponsive.  Mental status has improved in the ED.  Was likely down for 2 days. -Head CT with no acute findings -Overall mentation has improved and is pretty much back to her baseline.   E. coli UTI (urinary tract infection) -Changed to Bactrim 8/28 -ESBL -Planning for 5 more days to complete antibiotic therapy. -Denying dysuria.   AKI (acute kidney injury)-resolved -Creatinine 1.58, baseline about 0.6-1.  Likely from dehydration, hypotension, sepsis. -Continue IV fluid and monitor due to ongoing liquid stool output -Continue to follow renal function trend -Improved/resolved overall.   Seizure ([place]) -Continue Keppra -No seizure activity appreciated.   Essential hypertension-stable Initial hypotension systolic down to 33I.  Likely from no oral intake in the past 2 days, and sepsis presentation. -Not on antihypertensives. -Vital signs stable currently.   Diabetes mellitus without complication ([place]) -Not on medication.  And currently not needing hypoglycemic agents. - HgbA1c 4.7% -Monitor CBG daily  Right upper extremity DVT -Right upper extremity still tender/indurated; patient reported less painful. -Ultrasound evaluation demonstrated 2 blood clots -PICC line has been removed from patient and started on Eliquis. -Plan is for 32-monthtreatment.   DVT prophylaxis:SCDs Code Status: Full Family Communication: No family at bedside. Disposition Plan:  Status is: Inpatient Remains inpatient appropriate because: Need for pressors and IV medications.   Consultants:  GI   Procedures:  None   Antimicrobials:  Anti-infectives (From admission, onward)  Start      Dose/Rate Route Frequency Ordered Stop   12/28/21 1000  sulfamethoxazole-trimethoprim (BACTRIM DS) 800-160 MG per tablet 1 tablet        1 tablet Oral Every 12 hours 12/28/21 0844     12/26/21 1100  meropenem (MERREM) 1 g in sodium chloride 0.9 % 100 mL IVPB  Status:  Discontinued        1 g 200 mL/hr over 30 Minutes Intravenous Every 8 hours 12/26/21 1013 12/28/21 0844   12/24/21 1600  metroNIDAZOLE (FLAGYL) IVPB 500 mg  Status:  Discontinued        500 mg 100 mL/hr over 60 Minutes Intravenous Every 12 hours 12/24/21 0729 12/24/21 1141   12/24/21 1230  vancomycin (VANCOCIN) capsule 500 mg        500 mg Oral Every 6 hours 12/24/21 1141 01/07/22 1159   12/24/21 1230  metroNIDAZOLE (FLAGYL) IVPB 500 mg        500 mg 100 mL/hr over 60 Minutes Intravenous Every 8 hours 12/24/21 1141 01/07/22 1229   12/24/21 0400  ceFEPIme (MAXIPIME) 2 g in sodium chloride 0.9 % 100 mL IVPB  Status:  Discontinued        2 g 200 mL/hr over 30 Minutes Intravenous Every 12 hours 12/23/21 1535 12/23/21 2224   12/24/21 0400  cefTRIAXone (ROCEPHIN) 2 g in sodium chloride 0.9 % 100 mL IVPB  Status:  Discontinued        2 g 200 mL/hr over 30 Minutes Intravenous Every 24 hours 12/23/21 2224 12/26/21 1013   12/24/21 0400  metroNIDAZOLE (FLAGYL) IVPB 500 mg        500 mg 100 mL/hr over 60 Minutes Intravenous  Once 12/23/21 2224 12/24/21 0430   12/23/21 1545  ceFEPIme (MAXIPIME) 2 g in sodium chloride 0.9 % 100 mL IVPB        2 g 200 mL/hr over 30 Minutes Intravenous  Once 12/23/21 1530 12/23/21 1615   12/23/21 1530  aztreonam (AZACTAM) 2 g in sodium chloride 0.9 % 100 mL IVPB  Status:  Discontinued        2 g 200 mL/hr over 30 Minutes Intravenous  Once 12/23/21 1523 12/23/21 1530   12/23/21 1530  metroNIDAZOLE (FLAGYL) IVPB 500 mg        500 mg 100 mL/hr over 60 Minutes Intravenous  Once 12/23/21 1523 12/23/21 1630      Subjective: Still complaining of intermittent abdominal pain; no nausea, no vomiting.  Asking  to have diet further advance.  Requesting oral pain medication that can last longer.  Objective: Vitals:   12/30/21 2105 12/31/21 0502 12/31/21 0821 12/31/21 1300  BP: 134/88 116/69  95/63  Pulse: 93 99  (!) 111  Resp: 19 19  18   Temp: 99.2 F (37.3 C) 98 F (36.7 C)  98.2 F (36.8 C)  TempSrc:    Oral  SpO2: 94% 95% 94% 98%  Weight:      Height:        Intake/Output Summary (Last 24 hours) at 12/31/2021 1702 Last data filed at 12/31/2021 1400 Gross per 24 hour  Intake 927.07 ml  Output 4250 ml  Net -3322.93 ml   Filed Weights   12/27/21 0500 12/29/21 0421 12/29/21 1805  Weight: 57.6 kg 59.3 kg 58.4 kg    Examination: General exam: Alert, awake, oriented x 3; no chest pain, no nausea, no vomiting.  Still having intermittent abdominal pain and experiencing liquid stools. Respiratory system: Clear to auscultation. Respiratory effort  normal.  Good saturation on room air. Cardiovascular system:RRR. No rub or gallop; no JVD. Gastrointestinal system: Abdomen is slightly distended; diffusely tender to palpation, no masses, positive bowel sounds.  Overall no guarding. Central nervous system: Alert and oriented. No focal neurological deficits. Extremities: No cyanosis or clubbing. Skin: No petechiae.  Flexi-Seal in place. Psychiatry: Judgement and insight appear normal.  Affect appreciated.  Data Reviewed: I have personally reviewed following labs and imaging studies  CBC: Recent Labs  Lab 12/26/21 0344 12/27/21 0414 12/28/21 0359 12/29/21 0800 12/30/21 0342  WBC 19.0* 10.4 9.0 10.6* 8.0  HGB 10.5* 9.8* 9.5* 10.9* 10.1*  HCT 31.7* 30.5* 29.1* 32.8* 31.0*  MCV 96.1 95.9 96.4 95.3 96.0  PLT 195 165 174 231 384   Basic Metabolic Panel: Recent Labs  Lab 12/26/21 0344 12/27/21 0414 12/28/21 0359 12/29/21 0345 12/30/21 0342  NA 139 136 140 138 138  K 3.8 3.4* 3.3* 3.8 4.0  CL 110 107 109 108 108  CO2 23 23 27 25 25   GLUCOSE 97 101* 78 64* 65*  BUN 12 10 6  5* <5*   CREATININE 0.53 0.50 0.44 0.43* 0.54  CALCIUM 8.0* 8.0* 8.1* 8.2* 8.1*  MG 2.5* 1.7 1.5* 1.6* 1.9   GFR: Estimated Creatinine Clearance: 71 mL/min (by C-G formula based on SCr of 0.54 mg/dL).  Liver Function Tests: Recent Labs  Lab 12/25/21 0600  AST 34  ALT 14  ALKPHOS 119  BILITOT 0.6  PROT 4.2*  ALBUMIN 1.7*   Cardiac Enzymes: No results for input(s): "CKTOTAL", "CKMB", "CKMBINDEX", "TROPONINI" in the last 168 hours.  CBG: Recent Labs  Lab 12/27/21 0433 12/28/21 0659 12/29/21 0521 12/30/21 0530 12/31/21 0500  GLUCAP 86 79 75 65* 76    Sepsis Labs: Recent Labs  Lab 12/25/21 0600  LATICACIDVEN 1.2    Recent Results (from the past 240 hour(s))  Gastrointestinal Panel by PCR , Stool     Status: Abnormal   Collection Time: 12/23/21 12:37 AM   Specimen: Stool  Result Value Ref Range Status   Campylobacter species NOT DETECTED NOT DETECTED Final   Plesimonas shigelloides NOT DETECTED NOT DETECTED Final   Salmonella species DETECTED (A) NOT DETECTED Final    Comment: RESULT CALLED TO, READ BACK BY AND VERIFIED WITH: C/CODY [facility] 12/24/21 1927    Yersinia enterocolitica NOT DETECTED NOT DETECTED Final   Vibrio species NOT DETECTED NOT DETECTED Final   Vibrio cholerae NOT DETECTED NOT DETECTED Final   Enteroaggregative E coli (EAEC) NOT DETECTED NOT DETECTED Final   Enteropathogenic E coli (EPEC) NOT DETECTED NOT DETECTED Final   Enterotoxigenic E coli (ETEC) NOT DETECTED NOT DETECTED Final   Shiga like toxin producing E coli (STEC) NOT DETECTED NOT DETECTED Final   Shigella/Enteroinvasive E coli (EIEC) NOT DETECTED NOT DETECTED Final   Cryptosporidium NOT DETECTED NOT DETECTED Final   Cyclospora cayetanensis NOT DETECTED NOT DETECTED Final   Entamoeba histolytica NOT DETECTED NOT DETECTED Final   Giardia lamblia NOT DETECTED NOT DETECTED Final   Adenovirus F40/41 NOT DETECTED NOT DETECTED Final   Astrovirus NOT DETECTED NOT DETECTED Final   Norovirus  GI/GII NOT DETECTED NOT DETECTED Final   Rotavirus A NOT DETECTED NOT DETECTED Final   Sapovirus (I, II, IV, and V) NOT DETECTED NOT DETECTED Final    Comment: Performed at [facility], [address] [place], [place] [address]  Blood Culture (routine x 2)     Status: None   Collection Time: 12/23/21  3:27 PM  Specimen: Leg; Blood  Result Value Ref Range Status   Specimen Description LEG  Final   Special Requests   Final    BOTTLES DRAWN AEROBIC AND ANAEROBIC Blood Culture results may not be optimal due to an excessive volume of blood received in culture bottles   Culture   Final    NO GROWTH 5 DAYS Performed at [facility], [address] [place], [place] [address]    Report Status 12/28/2021 FINAL  Final  Resp Panel by RT-PCR (Flu A&B, Covid) Anterior Nasal Swab     Status: None   Collection Time: 12/23/21  3:45 PM   Specimen: Anterior Nasal Swab  Result Value Ref Range Status   SARS Coronavirus 2 by RT PCR NEGATIVE NEGATIVE Final    Comment: (NOTE) SARS-CoV-2 target nucleic acids are NOT DETECTED.  The SARS-CoV-2 RNA is generally detectable in upper respiratory specimens during the acute phase of infection. The lowest concentration of SARS-CoV-2 viral copies this assay can detect is 138 copies/mL. A negative result does not preclude SARS-Cov-2 infection and should not be used as the sole basis for treatment or other patient management decisions. A negative result may occur with  improper specimen collection/handling, submission of specimen other than nasopharyngeal swab, presence of viral mutation(s) within the areas targeted by this assay, and inadequate number of viral copies(<138 copies/mL). A negative result must be combined with clinical observations, patient history, and epidemiological information. The expected result is Negative.  Fact Sheet for Patients:  EntrepreneurPulse.com.au  Fact Sheet for Healthcare Providers:   IncredibleEmployment.be  This test is no t yet approved or cleared by the [place] FDA and  has been authorized for detection and/or diagnosis of SARS-CoV-2 by FDA under an Emergency Use Authorization (EUA). This EUA will remain  in effect (meaning this test can be used) for the duration of the COVID-19 declaration under Section 564(b)(1) of the Act, 21 U.S.C.section 360bbb-3(b)(1), unless the authorization is terminated  or revoked sooner.       Influenza A by PCR NEGATIVE NEGATIVE Final   Influenza B by PCR NEGATIVE NEGATIVE Final    Comment: (NOTE) The Xpert Xpress SARS-CoV-2/FLU/RSV plus assay is intended as an aid in the diagnosis of influenza from Nasopharyngeal swab specimens and should not be used as a sole basis for treatment. Nasal washings and aspirates are unacceptable for Xpert Xpress SARS-CoV-2/FLU/RSV testing.  Fact Sheet for Patients: EntrepreneurPulse.com.au  Fact Sheet for Healthcare Providers: IncredibleEmployment.be  This test is not yet approved or cleared by the [place] FDA and has been authorized for detection and/or diagnosis of SARS-CoV-2 by FDA under an Emergency Use Authorization (EUA). This EUA will remain in effect (meaning this test can be used) for the duration of the COVID-19 declaration under Section 564(b)(1) of the Act, 21 U.S.C. section 360bbb-3(b)(1), unless the authorization is terminated or revoked.  Performed at [facility], [address] [place], [place] [address]   Urine Culture     Status: Abnormal   Collection Time: 12/23/21  4:41 PM   Specimen: In/Out Cath Urine  Result Value Ref Range Status   Specimen Description   Final    IN/OUT CATH URINE Performed at [facility], [address] [place], [place] [address]    Special Requests   Final    NONE Performed at [facility], [address] [place], [place] [address]    Culture (A)  Final    >=100,000  COLONIES/mL ESCHERICHIA COLI Susceptibility Pattern Suggests Possibility of an Extended  Spectrum Beta Lactamase Producer. Contact Laboratory Within 7 Days if Confirmation Warranted. Performed at [facility], [place] [address], [place], [place] [address]    Report Status 12/26/2021 FINAL  Final   Organism ID, Bacteria ESCHERICHIA COLI (A)  Final      Susceptibility   Escherichia coli - MIC*    AMPICILLIN >=32 RESISTANT Resistant     CEFAZOLIN >=64 RESISTANT Resistant     CEFEPIME 16 RESISTANT Resistant     CEFTRIAXONE >=64 RESISTANT Resistant     CIPROFLOXACIN >=4 RESISTANT Resistant     GENTAMICIN <=1 SENSITIVE Sensitive     IMIPENEM <=0.25 SENSITIVE Sensitive     NITROFURANTOIN <=16 SENSITIVE Sensitive     TRIMETH/SULFA <=20 SENSITIVE Sensitive     AMPICILLIN/SULBACTAM >=32 RESISTANT Resistant     PIP/TAZO 16 SENSITIVE Sensitive     * >=100,000 COLONIES/mL ESCHERICHIA COLI  MRSA Next Gen by PCR, Nasal     Status: None   Collection Time: 12/23/21 11:01 PM   Specimen: Nasal Mucosa; Nasal Swab  Result Value Ref Range Status   MRSA by PCR Next Gen NOT DETECTED NOT DETECTED Final    Comment: (NOTE) The GeneXpert MRSA Assay (FDA approved for NASAL specimens only), is one component of a comprehensive MRSA colonization surveillance program. It is not intended to diagnose MRSA infection nor to guide or monitor treatment for MRSA infections. Test performance is not FDA approved in patients less than 69 years old. Performed at [facility], [address] [place], [place] [address]   C Difficile Quick Screen w PCR reflex     Status: Abnormal   Collection Time: 12/24/21 12:24 AM   Specimen: Stool  Result Value Ref Range Status   C Diff antigen POSITIVE (A) NEGATIVE Final   C Diff toxin NEGATIVE NEGATIVE Final   C Diff interpretation Results are indeterminate. See PCR results.  Final    Comment: Performed at [facility], [address], [place], [place] [address]  C. Diff by PCR,  Reflexed     Status: Abnormal   Collection Time: 12/24/21 12:24 AM  Result Value Ref Range Status   Toxigenic C. Difficile by PCR POSITIVE (A) NEGATIVE Final    Comment: Positive for toxigenic C. difficile with little to no toxin production. Only treat if clinical presentation suggests symptomatic illness. Performed at [facility], [place] [address], [place],  [address]     Radiology Studies: US Venous Img Upper Uni Right(DVT)  Result Date: 12/30/2021 CLINICAL DATA:  Right arm swelling for 3 days, indwelling PICC line EXAM: RIGHT UPPER EXTREMITY VENOUS DOPPLER ULTRASOUND TECHNIQUE: Gray-scale sonography with graded compression, as well as color Doppler and duplex ultrasound were performed to evaluate the upper extremity deep venous system from the level of the subclavian vein and including the jugular, axillary, basilic, radial, ulnar and upper cephalic vein. Spectral Doppler was utilized to evaluate flow at rest and with distal augmentation maneuvers. COMPARISON:  None Available. FINDINGS: Contralateral Subclavian Vein: Respiratory phasicity is normal and symmetric with the symptomatic side. No evidence of thrombus. Normal compressibility. Internal Jugular Vein: No evidence of thrombus. Normal compressibility, respiratory phasicity and response to augmentation. Subclavian Vein: There is an indwelling PICC line. Echogenic nonocclusive thrombus is seen within the subclavian vein surrounding the PICC line. Minimal color flow is detected. Axillary Vein: No evidence of thrombus. Normal compressibility, respiratory phasicity and response to augmentation. Cephalic Vein: No evidence of thrombus. Normal compressibility, respiratory phasicity and response to augmentation. Basilic Vein:  Indwelling PICC line is identified. There is echogenic occlusive thrombus within the basilic vein surrounding the PICC line. No Doppler flow detected. Brachial Veins: No evidence of thrombus. Normal compressibility,  respiratory phasicity and response to augmentation. Radial Veins: No evidence of thrombus. Normal compressibility, respiratory phasicity and response to augmentation. Ulnar Veins: No evidence of thrombus. Normal compressibility, respiratory phasicity and response to augmentation. Other Findings:  None visualized. IMPRESSION: 1. Occlusive thrombus within the right basilic vein surrounding the indwelling PICC line. There is nonocclusive thrombus extending into the subclavian vein surrounding the PICC line as well. These results will be called to the ordering clinician or representative by the Radiologist Assistant, and communication documented in the PACS or [employer]. Electronically Signed   By: Randa Ngo M.D.   On: 12/30/2021 10:53    Scheduled Meds:  amitriptyline  100 mg Oral QHS   apixaban  10 mg Oral BID   Followed by   Derrill Memo ON 01/06/2022] apixaban  5 mg Oral BID   Chlorhexidine Gluconate Cloth  6 each Topical Daily   levETIRAcetam  750 mg Oral BID   methylPREDNISolone (SOLU-MEDROL) injection  60 mg Intravenous Daily   pantoprazole  40 mg Oral BID   potassium chloride  40 mEq Oral BID   sodium chloride flush  10-40 mL Intracatheter Q12H   sodium chloride flush  10-40 mL Intracatheter Q12H   sulfamethoxazole-trimethoprim  1 tablet Oral Q12H   umeclidinium bromide  1 puff Inhalation Daily   vancomycin  500 mg Oral Q6H   Continuous Infusions:  lactated ringers 75 mL/hr at 12/29/21 1923   metronidazole 500 mg (12/31/21 1151)   promethazine (PHENERGAN) injection (IM or IVPB) 12.5 mg (12/29/21 1926)     LOS: 8 days    Roque Lias [facility]  If 7PM-7AM, please contact night-coverage [email or web address] 12/31/2021, 5:02 PM

## 2021-12-31 NOTE — Discharge Instructions (Addendum)
Information on my medicine - ELIQUIS (apixaban)  This medication education was reviewed with me or my healthcare representative as part of my discharge preparation.    Why was Eliquis prescribed for you? Eliquis was prescribed to treat blood clots that may have been found in the veins of your legs (deep vein thrombosis) or in your lungs (pulmonary embolism) and to reduce the risk of them occurring again.  What do You need to know about Eliquis ? The starting dose is 10 mg (two 5 mg tablets) taken TWICE daily for the FIRST SEVEN (7) DAYS, then on 01/06/2022 the dose is reduced to ONE 5 mg tablet taken TWICE daily.  Eliquis may be taken with or without food.   Try to take the dose about the same time in the morning and in the evening. If you have difficulty swallowing the tablet whole please discuss with your pharmacist how to take the medication safely.  Take Eliquis exactly as prescribed and DO NOT stop taking Eliquis without talking to the doctor who prescribed the medication.  Stopping may increase your risk of developing a new blood clot.  Refill your prescription before you run out.  After discharge, you should have regular check-up appointments with your healthcare provider that is prescribing your Eliquis.    What do you do if you miss a dose? If a dose of ELIQUIS is not taken at the scheduled time, take it as soon as possible on the same day and twice-daily administration should be resumed. The dose should not be doubled to make up for a missed dose.  Important Safety Information A possible side effect of Eliquis is bleeding. You should call your healthcare provider right away if you experience any of the following: Bleeding from an injury or your nose that does not stop. Unusual colored urine (red or dark brown) or unusual colored stools (red or black). Unusual bruising for unknown reasons. A serious fall or if you hit your head (even if there is no bleeding).  Some  medicines may interact with Eliquis and might increase your risk of bleeding or clotting while on Eliquis. To help avoid this, consult your healthcare provider or pharmacist prior to using any new prescription or non-prescription medications, including herbals, vitamins, non-steroidal anti-inflammatory drugs (NSAIDs) and supplements.  This website has more information on Eliquis (apixaban): http://[email or web address].eliquis.com/eliquis/home

## 2022-01-01 DIAGNOSIS — A419 Sepsis, unspecified organism: Secondary | ICD-10-CM | POA: Diagnosis not present

## 2022-01-01 DIAGNOSIS — A0472 Enterocolitis due to Clostridium difficile, not specified as recurrent: Secondary | ICD-10-CM | POA: Diagnosis not present

## 2022-01-01 DIAGNOSIS — G9341 Metabolic encephalopathy: Secondary | ICD-10-CM | POA: Diagnosis not present

## 2022-01-01 DIAGNOSIS — N179 Acute kidney failure, unspecified: Secondary | ICD-10-CM | POA: Diagnosis not present

## 2022-01-01 LAB — GLUCOSE, CAPILLARY
Glucose-Capillary: 62 mg/dL — ABNORMAL LOW (ref 70–99)
Glucose-Capillary: 73 mg/dL (ref 70–99)

## 2022-01-01 NOTE — Progress Notes (Addendum)
Gastroenterology Progress Note   Referring Provider: No ref. provider found Primary Care Physician:  Teodoro Kil, PA-C Primary Gastroenterologist:  Dr. Jenetta Downer  Patient ID: [name]; [id number]; 1970-07-11    Subjective   Still having abdominal pain. Reports to me that the tramadol was not very helpful but after receiving the fentanyl last night she slept like baby. Family will be bringing her depends this afternoon so we can remove the rectal tube. She reports she is scared to eat solid foods but she is hungry. Will let us know if she is not able to handle it. Has had ongoing nausea but no vomiting. Still having some rectal pain   About 575 total observed in the bag and last marked at 0500 but unclear over what time frame - is thicker in consistency.   Objective   Vital signs in last 24 hours Temp:  [97.4 F (36.3 C)-98.2 F (36.8 C)] 97.4 F (36.3 C) (09/01 0459) Pulse Rate:  [100-111] 103 (09/01 0459) Resp:  [18] 18 (09/01 0459) BP: (95-149)/(63-83) 149/83 (09/01 0459) SpO2:  [98 %] 98 % (09/01 0827) Last BM Date : 12/31/21  Physical Exam General:   Alert and oriented, pleasant Head:  Normocephalic and atraumatic. Eyes:  No icterus, sclera clear. Conjuctiva pink.  Heart:  S1, S2 present, no murmurs noted.  Lungs: Clear to auscultation bilaterally, without wheezing, rales, or rhonchi.  Abdomen:  Bowel sounds present, soft, mild distention. TTP throughout.  No HSM or hernias noted. No rebound or guarding. No masses appreciated  Msk:  Symmetrical without gross deformities. Normal posture. Extremities:  Without clubbing or edema. Neurologic:  Alert and  oriented x4;  grossly normal neurologically. Skin:  Warm and dry, minimal swelling to RUQ Psych:  Alert and cooperative. Normal mood and affect.  Intake/Output from previous day: 08/31 0701 - 09/01 0700 In: -  Out: 4600 [Urine:4600] Intake/Output this shift: Total I/O In: 1723.2 [P.O.:600; I.V.:521.9; IV  Piggyback:601.3] Out: 750 [Urine:750]  Lab Results  Recent Labs    12/30/21 0342  WBC 8.0  HGB 10.1*  HCT 31.0*  PLT 220   BMET Recent Labs    12/30/21 0342  NA 138  K 4.0  CL 108  CO2 25  GLUCOSE 65*  BUN <5*  CREATININE 0.54  CALCIUM 8.1*   LFT No results for input(s): "PROT", "ALBUMIN", "AST", "ALT", "ALKPHOS", "BILITOT", "BILIDIR", "IBILI" in the last 72 hours. PT/INR No results for input(s): "LABPROT", "INR" in the last 72 hours. Hepatitis Panel No results for input(s): "HEPBSAG", "HCVAB", "HEPAIGM", "HEPBIGM" in the last 72 hours. C-Diff PCR positive  Studies/Results US Venous Img Upper Uni Right(DVT)  Result Date: 12/30/2021 CLINICAL DATA:  Right arm swelling for 3 days, indwelling PICC line EXAM: RIGHT UPPER EXTREMITY VENOUS DOPPLER ULTRASOUND TECHNIQUE: Gray-scale sonography with graded compression, as well as color Doppler and duplex ultrasound were performed to evaluate the upper extremity deep venous system from the level of the subclavian vein and including the jugular, axillary, basilic, radial, ulnar and upper cephalic vein. Spectral Doppler was utilized to evaluate flow at rest and with distal augmentation maneuvers. COMPARISON:  None Available. FINDINGS: Contralateral Subclavian Vein: Respiratory phasicity is normal and symmetric with the symptomatic side. No evidence of thrombus. Normal compressibility. Internal Jugular Vein: No evidence of thrombus. Normal compressibility, respiratory phasicity and response to augmentation. Subclavian Vein: There is an indwelling PICC line. Echogenic nonocclusive thrombus is seen within the subclavian vein surrounding the PICC line. Minimal color flow is detected. Axillary Vein:  No evidence of thrombus. Normal compressibility, respiratory phasicity and response to augmentation. Cephalic Vein: No evidence of thrombus. Normal compressibility, respiratory phasicity and response to augmentation. Basilic Vein: Indwelling PICC line  is identified. There is echogenic occlusive thrombus within the basilic vein surrounding the PICC line. No Doppler flow detected. Brachial Veins: No evidence of thrombus. Normal compressibility, respiratory phasicity and response to augmentation. Radial Veins: No evidence of thrombus. Normal compressibility, respiratory phasicity and response to augmentation. Ulnar Veins: No evidence of thrombus. Normal compressibility, respiratory phasicity and response to augmentation. Other Findings:  None visualized. IMPRESSION: 1. Occlusive thrombus within the right basilic vein surrounding the indwelling PICC line. There is nonocclusive thrombus extending into the subclavian vein surrounding the PICC line as well. These results will be called to the ordering clinician or representative by the Radiologist Assistant, and communication documented in the PACS or [employer]. Electronically Signed   By: Randa Ngo M.D.   On: 12/30/2021 10:53   CT ABDOMEN PELVIS W CONTRAST  Result Date: 12/28/2021 CLINICAL DATA:  History of C diff. Now progressive ileus and distention. Bowel obstruction suspected. Evaluate for megacolon. Abdominal pain. Abnormal abdominal x-ray. History of Crohn's disease. EXAM: CT ABDOMEN AND PELVIS WITH CONTRAST TECHNIQUE: Multidetector CT imaging of the abdomen and pelvis was performed using the standard protocol following bolus administration of intravenous contrast. RADIATION DOSE REDUCTION: This exam was performed according to the departmental dose-optimization program which includes automated exposure control, adjustment of the mA and/or kV according to patient size and/or use of iterative reconstruction technique. CONTRAST:  174m OMNIPAQUE IOHEXOL 300 MG/ML  SOLN COMPARISON:  KUB 12/28/2021 and 12/23/2021; CT abdomen and pelvis 12/23/2021 FINDINGS: Lower chest: Heart size is again at the upper limits of normal. Mild to moderate pericardial effusion is unchanged. Hepatobiliary: Smooth liver  contours. No focal liver mass is identified. Mildly decreased attenuation throughout the liver suggesting mild fatty infiltration. Cholecystectomy clips. No intrahepatic or extrahepatic biliary ductal dilatation. Pancreas: No mass or inflammatory fat stranding. No pancreatic ductal dilatation is seen. Spleen: Normal in size without focal abnormality. Adrenals/Urinary Tract: Adrenal glands are unremarkable. The kidneys enhance uniformly and are symmetric in size without hydronephrosis. No renal stone is seen. No renal mass is seen. The urinary bladder is decompressed by indwelling Foley catheter. Minimal associated air within the nondependent aspect of the urinary bladder. Stomach/Bowel: Rectal balloon catheter is again noted. The rectum and sigmoid colon are now decompressed with likely similar mild wall thickening again suggesting proctocolitis. Mild wall thickening of the proximal descending colon (axial series 2, image 41) appears new from prior. There is oral contrast seen as distal as the ileum. There are air-fluid levels again seen within the ascending, transverse, and proximal descending colon with fluid also seen within the more distal colon. No loss of haustra markings within the colon to specifically raise suspicion for toxic megacolon. No dilated loops of small bowel are seen. Vascular/Lymphatic: No abdominal aortic aneurysm. No mesenteric, retroperitoneal, or pelvic lymphadenopathy. Reproductive: The uterus is surgically absent. No gross adnexal abnormality. Other: No abdominal wall hernia or abnormality.Moderate subcutaneous fat edema within the bilateral lower flanks and buttocks. No pneumoperitoneum. Mild free fluid within the pelvis is increased from prior. Musculoskeletal: Minimal levocurvature centered at L3. Mild anterior L2-3 endplate osteophytes. IMPRESSION: Compared to 12/23/2021: 1. Redemonstration of wall thickening within the rectum and sigmoid colon suggesting proctocolitis. New mild wall  thickening within a portion of the proximal descending colon. Air-fluid levels throughout the colon as can be seen with  diarrheal illness. 2. No dilated loops of small bowel to indicate bowel obstruction. 3. Mildly increased mild free fluid within the pelvis. 4. Increased moderate bilateral flank and buttock subcutaneous fat edema. Electronically Signed   By: Yvonne Kendall M.D.   On: 12/28/2021 18:18   DG Abd Portable 2V  Result Date: 12/28/2021 CLINICAL DATA:  Abdominal pain EXAM: PORTABLE ABDOMEN - 2 VIEW COMPARISON:  December 23, 2021 FINDINGS: Air-filled prominent loops of large and small bowel are identified. A loop of colon in the right upper quadrant is dilated up to 8.2 cm. The amount of dilatation is increased since December 23, 2021. No free air, portal venous gas, or pneumatosis. No other acute abnormalities. IMPRESSION: Air-filled dilated loops of large and small bowel most consistent with ileus. There is a loop of colon in the right upper quadrant measuring up to 8.2 cm. Recommend attention on follow-up. Electronically Signed   By: Dorise Bullion III M.D.   On: 12/28/2021 11:58   [place] EKG SITE RITE  Result Date: 12/24/2021 If Site Rite image not attached, placement could not be confirmed due to current cardiac rhythm.  CT HEAD WO CONTRAST (5MM)  Result Date: 12/23/2021 CLINICAL DATA:  Mental status change, unknown cause Falls, AMS, hypotension. EXAM: CT HEAD WITHOUT CONTRAST TECHNIQUE: Contiguous axial images were obtained from the base of the skull through the vertex without intravenous contrast. RADIATION DOSE REDUCTION: This exam was performed according to the departmental dose-optimization program which includes automated exposure control, adjustment of the mA and/or kV according to patient size and/or use of iterative reconstruction technique. COMPARISON:  None Available. FINDINGS: Brain: Normal anatomic configuration. No abnormal intra or extra-axial mass lesion or fluid collection. No  abnormal mass effect or midline shift. No evidence of acute intracranial hemorrhage or infarct. Ventricular size is normal. Cerebellum unremarkable. Vascular: Unremarkable Skull: Intact Sinuses/Orbits: Paranasal sinuses are clear. Orbits are unremarkable. Other: Mastoid air cells and middle ear cavities are clear. IMPRESSION: No acute intracranial abnormality. Electronically Signed   By: Fidela Salisbury M.D.   On: 12/23/2021 21:58   CT ABDOMEN PELVIS W CONTRAST  Result Date: 12/23/2021 CLINICAL DATA:  Abdominal pain and bloating for 1 week. Coffee-ground emesis. EXAM: CT ABDOMEN AND PELVIS WITH CONTRAST TECHNIQUE: Multidetector CT imaging of the abdomen and pelvis was performed using the standard protocol following bolus administration of intravenous contrast. RADIATION DOSE REDUCTION: This exam was performed according to the departmental dose-optimization program which includes automated exposure control, adjustment of the mA and/or kV according to patient size and/or use of iterative reconstruction technique. CONTRAST:  64m OMNIPAQUE IOHEXOL 300 MG/ML  SOLN COMPARISON:  10/24/2021 FINDINGS: Lower Chest: Stable small pericardial effusion. Hepatobiliary: No hepatic masses identified. Prior cholecystectomy. No evidence of biliary obstruction. Pancreas:  No mass or inflammatory changes. Spleen: Within normal limits in size and appearance. Adrenals/Urinary Tract: No masses identified. No evidence of ureteral calculi or hydronephrosis. Foley catheter is seen within the bladder. Stomach/Bowel: Air-fluid levels are seen throughout the colon which is mildly distended, consistent with ileus. Mild wall thickening is again seen involving the rectum and distal sigmoid colon with mild perirectal inflammatory changes, consistent with proctocolitis. No evidence of abscess or bowel obstruction. Vascular/Lymphatic: No pathologically enlarged lymph nodes. No acute vascular findings. Right femoral central venous catheter in  appropriate position. Reproductive: Prior hysterectomy noted. Adnexal regions are unremarkable in appearance. Other: Soft tissue gas noted in the right inguinal region and abductor muscles of the thigh, likely related to the right femoral venous catheter  placement. Musculoskeletal:  No suspicious bone lesions identified. IMPRESSION: Mild proctocolitis involving the rectosigmoid, without significant change. No evidence of abscess or other complication. Mild colonic ileus. Stable small pericardial effusion. Electronically Signed   By: Marlaine Hind M.D.   On: 12/23/2021 19:08   DG Chest Port 1 View  Result Date: 12/23/2021 CLINICAL DATA:  Found unresponsive. EXAM: PORTABLE CHEST 1 VIEW COMPARISON:  March 20, 2021 FINDINGS: The heart size and mediastinal contours are within normal limits. Both lungs are clear. The visualized skeletal structures are unremarkable. IMPRESSION: No active disease. Electronically Signed   By: Virgina Norfolk M.D.   On: 12/23/2021 17:18   DG Abdomen 1 View  Result Date: 12/23/2021 CLINICAL DATA:  Central line placement EXAM: ABDOMEN - 1 VIEW COMPARISON:  None Available. FINDINGS: Right femoral central line is in place with the tip in the expected location of the common iliac vein. Bowel gas pattern is unremarkable. IMPRESSION: Central line on the right at the expected location of the common iliac vein. Electronically Signed   By: Nelson Chimes M.D.   On: 12/23/2021 15:36    Assessment  51 y.o. female with a history of asthma, anxiety, PTSD, DM 2, seizures, HTN, GERD, Crohn's colitis with history of stricturing (diagnosed at age 51) who presented to the ED via EMS after neighbor found her to be unresponsive on the bathroom floor.  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  CT with mild proctocolitis, mild colonic ileus.  C. difficile antigen positive, toxin negative, PCR positive.  C. Difficile: Treated 2 months prior.  Leukocytosis resolved.   Rectal tube has been in place since admission with unreliable documented output.  She reported ongoing lower abdominal pain and rectal discomfort which started after the rectal tube was placed.  She reported worsening abdominal distention on Monday with air-filled dilated loops of large and small bowel on x-ray, follow-up CT A/P redemonstrated wall thickening of the rectum and sigmoid colon, new mild wall thickening and portion of proximal descending colon, no findings of ileus, bowel obstruction, or suspicion of toxic megacolon.  She will continue azithromycin and Flagyl.  May need outpatient referral for FMT given recurrence of C. difficile.  Crohn's colitis: Currently receiving IV Solu-Medrol with plans to transition to prednisone 40 mg daily when she is able to tolerate diet.  Her case is complicated by stricturing and prior allergic reactions to both Humira and Remicade with plans to avoid anti-TNF agents.  She has had multiple hospitalizations for her IBD with most recent hospitalization at [facility] and was recently seen in the IBD clinic at [facility] by Dr. Nyoka Cowden was in the process of work-up to initiate Stelara versus surgical management which the patient has previously declined but her care has been interrupted by multiple hospitalizations.  She has a pending appointment with Dr. Kennith Gain at [facility] on 01/15/2022 for a new patient appointment.  Our service contacted Dr. Rolly Salter office to get her follow-up with them and it was scheduled for 01/21/2022 but are working on having it moved up sooner.  Patient recently desired considering surgical intervention and requested to follow-up with Dr. Nyoka Cowden at [facility].  She should call and cancel her appointment with [facility].  Right upper arm swelling: Noted on 8/30.  She had ultrasound showing occlusive thrombus in the right basilic vein surrounding her PICC line and nonocclusive thrombus extending into the subclavian vein around the PICC.  PICC was removed and  a midline was  placed.  Plan / Recommendations  Advance diet to fulls Continue vancomycin and Flagyl Continue IV Solu-Medrol, transition to oral prednisone once tolerating diet Remove rectal tube Strict I&Os Follow-up 9/21 with Dr. Nyoka Cowden at [facility] for Crohn's care Consider [place] outpatient for recurrent C. difficile.    LOS: 9 days    01/01/2022, 12:24 PM   Venetia Night, MSN, FNP-BC, AGACNP-BC [facility]

## 2022-01-01 NOTE — Plan of Care (Signed)

## 2022-01-01 NOTE — Progress Notes (Signed)
PROGRESS NOTE    [name]  HXT:[id number] DOB: 1970/06/22 DOA: 12/23/2021 PCP: Teodoro Kil, PA-C   Brief Narrative:  [name] is a 51 y.o. female with medical history significant for Crohn's disease, hypertension, diabetes, seizures. Patient was found unresponsive at home. She reports chronic diarrhea, up to 10 times a day.  Reports intermittent vomiting-Chronic.  She has been admitted with severe sepsis, present on admission secondary to C. difficile colitis with associated acute metabolic encephalopathy as well as AKI.  She is also thought to have a UTI and is on multiple antibiotics.  GI consulted with recommendations to transfer to tertiary care center, but no beds are currently available at multiple facilities.  She currently appears to be improving and does not need transfer at the moment. GI following and stool output diminishing, but still on CLD.  Assessment & Plan:   Principal Problem:   Severe sepsis ([place]) Active Problems:   Acute metabolic encephalopathy   Proctocolitis   AKI (acute kidney injury) ([place])   UTI (urinary tract infection)   COPD (chronic obstructive pulmonary disease) (HCC)   Crohn's disease ([place])   Diabetes mellitus without complication (HCC)   Lactic acidosis   Essential hypertension   MDD (major depressive disorder)   Seizure (H[facility])   C. difficile colitis  Assessment and Plan:  Septic shock, present on admission, due to Cdiff colitis -Appreciate ongoing GI evaluation with CT abdomen and pelvis performed 8/28 with no signs of toxic megacolon noted -Continue now on oral vancomycin as well as IV Flagyl -Continue to monitor closely -Continue slowly advancing diet. -Continue to follow GI service recommendation.   Chronic enterocolitis -Diffuse abdominal tenderness, continues to have large-volume diarrhea.  History of Crohn's disease, not on treatment.  Reported blood in stools.  Hemoglobin stable. -History of Crohn's, appreciate GI  evaluation with history of chronic diarrhea.  Follow GI recommendations. -Continue Solu-Medrol as ordered for now -As needed tramadol has been added.  Continue to follow response.   Mild hypomagnesemia -Need to follow electrolytes and further replete as needed.   Acute metabolic encephalopathy-resolved Found down and unresponsive.  Mental status has improved in the ED.  Was likely down for 2 days. -Head CT with no acute findings -Overall mentation has improved and is pretty much back to her baseline.   E. coli UTI (urinary tract infection) -Patient will be completing antibiotic therapy for ESBL after today's doses; she will have had a total of 7 days treatment between meropenem and Bactrim. -Attempt voiding trial with removal of Foley catheter later today.   AKI (acute kidney injury)-resolved -Creatinine 1.58, baseline about 0.6-1.  Likely from dehydration, hypotension, sepsis. -Continue IV fluid and monitor due to ongoing liquid stool output -Continue to follow renal function trend -Improved/resolved overall.   Seizure ([place]) -Continue Keppra -No seizure activity appreciated.   Essential hypertension-stable Initial hypotension systolic down to 16P.  Likely from no oral intake in the past 2 days, and sepsis presentation. -Not on antihypertensives. -Vital signs stable currently.   Diabetes mellitus without complication ([place]) -Not on medication.  And currently not needing hypoglycemic agents. - HgbA1c 4.7% -Monitor CBG daily  Right upper extremity DVT -Right upper extremity still tender/indurated; patient reported less painful. -Ultrasound evaluation demonstrated 2 blood clots -PICC line has been removed from patient and started on Eliquis. -Plan is for 85-monthtreatment.   DVT prophylaxis:SCDs Code Status: Full Family Communication: No family at bedside. Disposition Plan:  Status is: Inpatient Remains inpatient appropriate because: Need for pressors and  IV medications.    Consultants:  GI   Procedures:  None   Antimicrobials:  Anti-infectives (From admission, onward)    Start     Dose/Rate Route Frequency Ordered Stop   12/28/21 1000  sulfamethoxazole-trimethoprim (BACTRIM DS) 800-160 MG per tablet 1 tablet        1 tablet Oral Every 12 hours 12/28/21 0844 01/01/22 2359   12/26/21 1100  meropenem (MERREM) 1 g in sodium chloride 0.9 % 100 mL IVPB  Status:  Discontinued        1 g 200 mL/hr over 30 Minutes Intravenous Every 8 hours 12/26/21 1013 12/28/21 0844   12/24/21 1600  metroNIDAZOLE (FLAGYL) IVPB 500 mg  Status:  Discontinued        500 mg 100 mL/hr over 60 Minutes Intravenous Every 12 hours 12/24/21 0729 12/24/21 1141   12/24/21 1230  vancomycin (VANCOCIN) capsule 500 mg        500 mg Oral Every 6 hours 12/24/21 1141 01/07/22 1159   12/24/21 1230  metroNIDAZOLE (FLAGYL) IVPB 500 mg        500 mg 100 mL/hr over 60 Minutes Intravenous Every 8 hours 12/24/21 1141 01/07/22 1229   12/24/21 0400  ceFEPIme (MAXIPIME) 2 g in sodium chloride 0.9 % 100 mL IVPB  Status:  Discontinued        2 g 200 mL/hr over 30 Minutes Intravenous Every 12 hours 12/23/21 1535 12/23/21 2224   12/24/21 0400  cefTRIAXone (ROCEPHIN) 2 g in sodium chloride 0.9 % 100 mL IVPB  Status:  Discontinued        2 g 200 mL/hr over 30 Minutes Intravenous Every 24 hours 12/23/21 2224 12/26/21 1013   12/24/21 0400  metroNIDAZOLE (FLAGYL) IVPB 500 mg        500 mg 100 mL/hr over 60 Minutes Intravenous  Once 12/23/21 2224 12/24/21 0430   12/23/21 1545  ceFEPIme (MAXIPIME) 2 g in sodium chloride 0.9 % 100 mL IVPB        2 g 200 mL/hr over 30 Minutes Intravenous  Once 12/23/21 1530 12/23/21 1615   12/23/21 1530  aztreonam (AZACTAM) 2 g in sodium chloride 0.9 % 100 mL IVPB  Status:  Discontinued        2 g 200 mL/hr over 30 Minutes Intravenous  Once 12/23/21 1523 12/23/21 1530   12/23/21 1530  metroNIDAZOLE (FLAGYL) IVPB 500 mg        500 mg 100 mL/hr over 60 Minutes Intravenous  Once  12/23/21 1523 12/23/21 1630      Subjective: Reports having a good night sleep; still intermittent episodes of abdominal pain and having liquid stool.  No nausea, no vomiting, reports waiting on her adult diapers in order to have Flexi-Seal and Foley catheter removed today.  Would like to have diet further advance.  Tolerating clear liquids.  Objective: Vitals:   12/31/21 2026 01/01/22 0459 01/01/22 0827 01/01/22 1257  BP: (!) 147/79 (!) 149/83  (!) 142/88  Pulse: 100 (!) 103  (!) 125  Resp: 18 18  18   Temp: 97.8 F (36.6 C) (!) 97.4 F (36.3 C)  98.3 F (36.8 C)  TempSrc: Oral Oral  Oral  SpO2: 98% 98% 98% 98%  Weight:      Height:        Intake/Output Summary (Last 24 hours) at 01/01/2022 1744 Last data filed at 01/01/2022 1501 Gross per 24 hour  Intake 2083.17 ml  Output 4100 ml  Net -2016.83 ml   [employer]  12/27/21 0500 12/29/21 0421 12/29/21 1805  Weight: 57.6 kg 59.3 kg 58.4 kg    Examination: General exam: Alert, awake, oriented x 3; reports having a good night sleep and overall feeling better.  Still having intermittent abdominal pain and would like to have her diet slightly further advance.  So far tolerating clear liquids.  Currently without nausea vomiting. Respiratory system: Good air movement bilaterally; no using accessory muscle.  Good saturation on room air. Cardiovascular system:RRR. No rubs or gallops; no JVD. Gastrointestinal system: Abdomen is distended, without guarding and with positive bowel sounds.  Diffusely tender to deep palpation.  Some liquid stools appreciated on Flexi-Seal. Central nervous system: Alert and oriented. No focal neurological deficits.  Patient expressed feeling generally weak. Extremities: No cyanosis or clubbing.  Left upper extremity with midline in place. Skin: No petechiae.  Flexi-Seal in place. Psychiatry: Judgement and insight appear normal. Mood & affect appropriate.   Data Reviewed: I have personally reviewed  following labs and imaging studies  CBC: Recent Labs  Lab 12/26/21 0344 12/27/21 0414 12/28/21 0359 12/29/21 0800 12/30/21 0342  WBC 19.0* 10.4 9.0 10.6* 8.0  HGB 10.5* 9.8* 9.5* 10.9* 10.1*  HCT 31.7* 30.5* 29.1* 32.8* 31.0*  MCV 96.1 95.9 96.4 95.3 96.0  PLT 195 165 174 231 774   Basic Metabolic Panel: Recent Labs  Lab 12/26/21 0344 12/27/21 0414 12/28/21 0359 12/29/21 0345 12/30/21 0342  NA 139 136 140 138 138  K 3.8 3.4* 3.3* 3.8 4.0  CL 110 107 109 108 108  CO2 23 23 27 25 25   GLUCOSE 97 101* 78 64* 65*  BUN 12 10 6  5* <5*  CREATININE 0.53 0.50 0.44 0.43* 0.54  CALCIUM 8.0* 8.0* 8.1* 8.2* 8.1*  MG 2.5* 1.7 1.5* 1.6* 1.9   GFR: Estimated Creatinine Clearance: 71 mL/min (by C-G formula based on SCr of 0.54 mg/dL).  Liver Function Tests: No results for input(s): "AST", "ALT", "ALKPHOS", "BILITOT", "PROT", "ALBUMIN" in the last 168 hours.  Cardiac Enzymes: No results for input(s): "CKTOTAL", "CKMB", "CKMBINDEX", "TROPONINI" in the last 168 hours.  CBG: Recent Labs  Lab 12/29/21 0521 12/30/21 0530 12/31/21 0500 01/01/22 0506 01/01/22 0551  GLUCAP 75 65* 76 62* 73    Sepsis Labs: No results for input(s): "PROCALCITON", "LATICACIDVEN" in the last 168 hours.   Recent Results (from the past 240 hour(s))  Gastrointestinal Panel by PCR , Stool     Status: Abnormal   Collection Time: 12/23/21 12:37 AM   Specimen: Stool  Result Value Ref Range Status   Campylobacter species NOT DETECTED NOT DETECTED Final   Plesimonas shigelloides NOT DETECTED NOT DETECTED Final   Salmonella species DETECTED (A) NOT DETECTED Final    Comment: RESULT CALLED TO, READ BACK BY AND VERIFIED WITH: C/CODY [facility] 12/24/21 1927    Yersinia enterocolitica NOT DETECTED NOT DETECTED Final   Vibrio species NOT DETECTED NOT DETECTED Final   Vibrio cholerae NOT DETECTED NOT DETECTED Final   Enteroaggregative E coli (EAEC) NOT DETECTED NOT DETECTED Final   Enteropathogenic E coli  (EPEC) NOT DETECTED NOT DETECTED Final   Enterotoxigenic E coli (ETEC) NOT DETECTED NOT DETECTED Final   Shiga like toxin producing E coli (STEC) NOT DETECTED NOT DETECTED Final   Shigella/Enteroinvasive E coli (EIEC) NOT DETECTED NOT DETECTED Final   Cryptosporidium NOT DETECTED NOT DETECTED Final   Cyclospora cayetanensis NOT DETECTED NOT DETECTED Final   Entamoeba histolytica NOT DETECTED NOT DETECTED Final   Giardia lamblia NOT DETECTED NOT DETECTED Final  Adenovirus F40/41 NOT DETECTED NOT DETECTED Final   Astrovirus NOT DETECTED NOT DETECTED Final   Norovirus GI/GII NOT DETECTED NOT DETECTED Final   Rotavirus A NOT DETECTED NOT DETECTED Final   Sapovirus (I, II, IV, and V) NOT DETECTED NOT DETECTED Final    Comment: Performed at [facility], [address] [place], [place] [address]  Blood Culture (routine x 2)     Status: None   Collection Time: 12/23/21  3:27 PM   Specimen: Leg; Blood  Result Value Ref Range Status   Specimen Description LEG  Final   Special Requests   Final    BOTTLES DRAWN AEROBIC AND ANAEROBIC Blood Culture results may not be optimal due to an excessive volume of blood received in culture bottles   Culture   Final    NO GROWTH 5 DAYS Performed at [facility], [address], [place], [place] [address]    Report Status 12/28/2021 FINAL  Final  Resp Panel by RT-PCR (Flu A&B, Covid) Anterior Nasal Swab     Status: None   Collection Time: 12/23/21  3:45 PM   Specimen: Anterior Nasal Swab  Result Value Ref Range Status   SARS Coronavirus 2 by RT PCR NEGATIVE NEGATIVE Final    Comment: (NOTE) SARS-CoV-2 target nucleic acids are NOT DETECTED.  The SARS-CoV-2 RNA is generally detectable in upper respiratory specimens during the acute phase of infection. The lowest concentration of SARS-CoV-2 viral copies this assay can detect is 138 copies/mL. A negative result does not preclude SARS-Cov-2 infection and should not be used as the sole basis for  treatment or other patient management decisions. A negative result may occur with  improper specimen collection/handling, submission of specimen other than nasopharyngeal swab, presence of viral mutation(s) within the areas targeted by this assay, and inadequate number of viral copies(<138 copies/mL). A negative result must be combined with clinical observations, patient history, and epidemiological information. The expected result is Negative.  Fact Sheet for Patients:  EntrepreneurPulse.com.au  Fact Sheet for Healthcare Providers:  IncredibleEmployment.be  This test is no t yet approved or cleared by the [place] FDA and  has been authorized for detection and/or diagnosis of SARS-CoV-2 by FDA under an Emergency Use Authorization (EUA). This EUA will remain  in effect (meaning this test can be used) for the duration of the COVID-19 declaration under Section 564(b)(1) of the Act, 21 U.S.C.section 360bbb-3(b)(1), unless the authorization is terminated  or revoked sooner.       Influenza A by PCR NEGATIVE NEGATIVE Final   Influenza B by PCR NEGATIVE NEGATIVE Final    Comment: (NOTE) The Xpert Xpress SARS-CoV-2/FLU/RSV plus assay is intended as an aid in the diagnosis of influenza from Nasopharyngeal swab specimens and should not be used as a sole basis for treatment. Nasal washings and aspirates are unacceptable for Xpert Xpress SARS-CoV-2/FLU/RSV testing.  Fact Sheet for Patients: EntrepreneurPulse.com.au  Fact Sheet for Healthcare Providers: IncredibleEmployment.be  This test is not yet approved or cleared by the [place] FDA and has been authorized for detection and/or diagnosis of SARS-CoV-2 by FDA under an Emergency Use Authorization (EUA). This EUA will remain in effect (meaning this test can be used) for the duration of the COVID-19 declaration under Section 564(b)(1) of the Act, 21  U.S.C. section 360bbb-3(b)(1), unless the authorization is terminated or revoked.  Performed at [facility], [address], [place], [place] [address]   Urine Culture     Status: Abnormal   Collection Time:  12/23/21  4:41 PM   Specimen: In/Out Cath Urine  Result Value Ref Range Status   Specimen Description   Final    IN/OUT CATH URINE Performed at [facility], [address] [place], [place] [address]    Special Requests   Final    NONE Performed at [facility], [address], [place], [place] [address]    Culture (A)  Final    >=100,000 COLONIES/mL ESCHERICHIA COLI Susceptibility Pattern Suggests Possibility of an Extended Spectrum Beta Lactamase Producer. Contact Laboratory Within 7 Days if Confirmation Warranted. Performed at [facility], [place] [address] [place], [place] [address]    Report Status 12/26/2021 FINAL  Final   Organism ID, Bacteria ESCHERICHIA COLI (A)  Final      Susceptibility   Escherichia coli - MIC*    AMPICILLIN >=32 RESISTANT Resistant     CEFAZOLIN >=64 RESISTANT Resistant     CEFEPIME 16 RESISTANT Resistant     CEFTRIAXONE >=64 RESISTANT Resistant     CIPROFLOXACIN >=4 RESISTANT Resistant     GENTAMICIN <=1 SENSITIVE Sensitive     IMIPENEM <=0.25 SENSITIVE Sensitive     NITROFURANTOIN <=16 SENSITIVE Sensitive     TRIMETH/SULFA <=20 SENSITIVE Sensitive     AMPICILLIN/SULBACTAM >=32 RESISTANT Resistant     PIP/TAZO 16 SENSITIVE Sensitive     * >=100,000 COLONIES/mL ESCHERICHIA COLI  MRSA Next Gen by PCR, Nasal     Status: None   Collection Time: 12/23/21 11:01 PM   Specimen: Nasal Mucosa; Nasal Swab  Result Value Ref Range Status   MRSA by PCR Next Gen NOT DETECTED NOT DETECTED Final    Comment: (NOTE) The GeneXpert MRSA Assay (FDA approved for NASAL specimens only), is one component of a comprehensive MRSA colonization surveillance program. It is not intended to diagnose MRSA infection nor to guide or monitor treatment for MRSA  infections. Test performance is not FDA approved in patients less than 64 years old. Performed at [facility], [address], [place], [place] [address]   C Difficile Quick Screen w PCR reflex     Status: Abnormal   Collection Time: 12/24/21 12:24 AM   Specimen: Stool  Result Value Ref Range Status   C Diff antigen POSITIVE (A) NEGATIVE Final   C Diff toxin NEGATIVE NEGATIVE Final   C Diff interpretation Results are indeterminate. See PCR results.  Final    Comment: Performed at [facility], [address], [place], [place] [address]  C. Diff by PCR, Reflexed     Status: Abnormal   Collection Time: 12/24/21 12:24 AM  Result Value Ref Range Status   Toxigenic C. Difficile by PCR POSITIVE (A) NEGATIVE Final    Comment: Positive for toxigenic C. difficile with little to no toxin production. Only treat if clinical presentation suggests symptomatic illness. Performed at [facility], [place] [address] [place], [place] [address]     Radiology Studies: No results found.  Scheduled Meds:  amitriptyline  100 mg Oral QHS   apixaban  10 mg Oral BID   Followed by   Derrill Memo ON 01/06/2022] apixaban  5 mg Oral BID   Chlorhexidine Gluconate Cloth  6 each Topical Daily   levETIRAcetam  750 mg Oral BID   methylPREDNISolone (SOLU-MEDROL) injection  60 mg Intravenous Daily   pantoprazole  40 mg Oral BID   potassium chloride  40 mEq Oral BID   sodium chloride flush  10-40 mL Intracatheter Q12H   sodium chloride flush  10-40 mL Intracatheter Q12H   sulfamethoxazole-trimethoprim  1 tablet Oral Q12H   umeclidinium bromide  1 puff Inhalation Daily   vancomycin  500 mg Oral Q6H   Continuous Infusions:  lactated ringers 75 mL/hr at 01/01/22 1147   metronidazole 500 mg (01/01/22 1208)   promethazine (PHENERGAN) injection (IM or IVPB) 12.5 mg (12/29/21 1926)     LOS: 9 days    Roque Lias [facility]  If 7PM-7AM, please contact night-coverage [email or web address] 01/01/2022, 5:44 PM

## 2022-01-02 DIAGNOSIS — N179 Acute kidney failure, unspecified: Secondary | ICD-10-CM | POA: Diagnosis not present

## 2022-01-02 DIAGNOSIS — G9341 Metabolic encephalopathy: Secondary | ICD-10-CM | POA: Diagnosis not present

## 2022-01-02 DIAGNOSIS — A419 Sepsis, unspecified organism: Secondary | ICD-10-CM | POA: Diagnosis not present

## 2022-01-02 DIAGNOSIS — A0472 Enterocolitis due to Clostridium difficile, not specified as recurrent: Secondary | ICD-10-CM | POA: Diagnosis not present

## 2022-01-02 LAB — CBC
HCT: 31.6 % — ABNORMAL LOW (ref 36.0–46.0)
Hemoglobin: 10.4 g/dL — ABNORMAL LOW (ref 12.0–15.0)
MCH: 31.7 pg (ref 26.0–34.0)
MCHC: 32.9 g/dL (ref 30.0–36.0)
MCV: 96.3 fL (ref 80.0–100.0)
Platelets: 125 10*3/uL — ABNORMAL LOW (ref 150–400)
RBC: 3.28 MIL/uL — ABNORMAL LOW (ref 3.87–5.11)
RDW: 14.7 % (ref 11.5–15.5)
WBC: 9.4 10*3/uL (ref 4.0–10.5)
nRBC: 0 % (ref 0.0–0.2)

## 2022-01-02 LAB — BASIC METABOLIC PANEL
Anion gap: 4 — ABNORMAL LOW (ref 5–15)
BUN: <5 mg/dL — ABNORMAL LOW (ref 6–20)
CO2: 26 mmol/L (ref 22–32)
Calcium: 8.3 mg/dL — ABNORMAL LOW (ref 8.9–10.3)
Chloride: 106 mmol/L (ref 98–111)
Creatinine, Ser: 0.69 mg/dL (ref 0.44–1.00)
GFR, Estimated: >60 mL/min (ref >60)
Glucose, Bld: 72 mg/dL (ref 70–99)
Potassium: 5.7 mmol/L — ABNORMAL HIGH (ref 3.5–5.1)
Sodium: 136 mmol/L (ref 135–145)

## 2022-01-02 LAB — C-REACTIVE PROTEIN: CRP: <0.5 mg/dL (ref <1.0)

## 2022-01-02 LAB — GLUCOSE, CAPILLARY: Glucose-Capillary: 111 mg/dL — ABNORMAL HIGH (ref 70–99)

## 2022-01-02 MED ORDER — ZINC OXIDE 40 % EX OINT
TOPICAL_OINTMENT | Freq: Three times a day (TID) | CUTANEOUS | Status: DC
  Filled 2022-01-02: qty 57

## 2022-01-02 MED ORDER — HYDROXYZINE HCL 25 MG PO TABS
25.0000 mg | ORAL_TABLET | Freq: Three times a day (TID) | ORAL | Status: DC | PRN
  Administered 2022-01-02: 25 mg via ORAL
  Filled 2022-01-02: qty 1

## 2022-01-02 MED ORDER — ENSURE ENLIVE PO LIQD
237.0000 mL | Freq: Two times a day (BID) | ORAL | Status: DC
  Administered 2022-01-02 (×2): 237 mL via ORAL

## 2022-01-02 NOTE — Progress Notes (Signed)
At approx 0025, overheard bathroom call light activation from charting station.  Observed to be pt call light.  Entered pt room to find pt on bathroom floor on knees.  Pt states she had gotten OOB to use bathroom.  States after urinating/BM, she got off toilet to go back to bed and got weak after standing.  Pt states she then chose to lower self to her knees so that she could reach  bathroom call light as she was unable to step forward to reach light.  Pt states she did not fall, did not hit head, used rails to lower self, denies injury.  Pt assisted back to bed x 2 assist.  Pt with unsteady gait during ambulation.  VSS, noted to be at pt baseline with tachycardia and elevated BP.  Skin assessed, no new injuries.  Pt reports only pain is her acute source of pain in ABD that has been present since admission, no new sources of pain.   Pt A/O x 4, responding to all questions appropriately.  Pt states she just felt urge for void/BM with removal of rectal tube and wanted to get to the bathroom.  Reinforced need to call for assistance.  Pt advised that bed alarm would be placed on for patient safety at this time, pt verbalizes understanding.  Provider notified of this event, no new orders received.  Charge nurse present and helped assist pt back to bed.  Pt declines need for contacting family.  Will continue to monitor.

## 2022-01-02 NOTE — Progress Notes (Signed)
[name], M.D. Gastroenterology & Hepatology   Interval History:  Patient reports feeling better today and states that her abdominal pain is still present but is milder compared to prior.  She has asked for fentanyl 2 times today which is less compared to prior.  Is asking for solid food.  States that her stool output has decreased and she did not have any bowel movements overnight.  No nausea, vomiting, melena or hematochezia.  Inpatient Medications:  Current Facility-Administered Medications:    acetaminophen (TYLENOL) tablet 650 mg, 650 mg, Oral, Q6H PRN, 650 mg at 12/31/21 1948 **OR** acetaminophen (TYLENOL) suppository 650 mg, 650 mg, Rectal, Q6H PRN, [name], Christine E, MD   albuterol (PROVENTIL) (2.5 MG/3ML) 0.083% nebulizer solution 3 mL, 3 mL, Inhalation, Q4H PRN, [name], Christine D, DO   ALPRAZolam [name]) tablet 1 mg, 1 mg, Oral, TID PRN, [name], Christine D, DO, 1 mg at 01/01/22 2211   amitriptyline (ELAVIL) tablet 100 mg, 100 mg, Oral, QHS, [name], Christine D, DO, 100 mg at 01/01/22 2211   apixaban (ELIQUIS) tablet 10 mg, 10 mg, Oral, BID, 10 mg at 01/02/22 0952 **FOLLOWED BY** [START ON 01/06/2022] apixaban (ELIQUIS) tablet 5 mg, 5 mg, Oral, BID, Christine Dubois, MD   Chlorhexidine Gluconate Cloth 2 % PADS 6 each, 6 each, Topical, Daily, [name], Oladapo, DO, 6 each at 01/02/22 0949   cyclobenzaprine (FLEXERIL) tablet 10 mg, 10 mg, Oral, TID PRN, [name], Christine D, DO, 10 mg at 01/01/22 2212   feeding supplement (ENSURE ENLIVE / ENSURE PLUS) liquid 237 mL, 237 mL, Oral, BID BM, Christine Dubois, MD, 237 mL at 01/02/22 0950   fentaNYL (SUBLIMAZE) injection 25 mcg, 25 mcg, Intravenous, Q2H PRN, Christine Dubois, MD, 25 mcg at 01/02/22 0107   lactated ringers infusion, , Intravenous, Continuous, [name], Christine D, DO, Last Rate: 75 mL/hr at 01/02/22 0216, Infusion Verify at 01/02/22 0216   levETIRAcetam (KEPPRA) tablet 750 mg, 750 mg, Oral, BID, [name], Christine E, MD, 750 mg at 01/02/22 0948    methylPREDNISolone sodium succinate (SOLU-MEDROL) 125 mg/2 mL injection 60 mg, 60 mg, Intravenous, Daily, Christine Menghini, PA-C, 60 mg at 01/02/22 0948   metroNIDAZOLE (FLAGYL) IVPB 500 mg, 500 mg, Intravenous, Q8H, [name], Christine D, DO, Last Rate: 100 mL/hr at 01/02/22 0551, 500 mg at 01/02/22 0551   ondansetron (ZOFRAN) injection 4 mg, 4 mg, Intravenous, Q6H PRN, [name], Christine D, DO, 4 mg at 01/01/22 1507   Oral care mouth rinse, 15 mL, Mouth Rinse, PRN, [name], Christine D, DO   pantoprazole (PROTONIX) EC tablet 40 mg, 40 mg, Oral, BID, Christine Dubois, MD, 40 mg at 01/02/22 0949   polyethylene glycol (MIRALAX / GLYCOLAX) packet 17 g, 17 g, Oral, Daily PRN, [name], Christine E, MD   promethazine (PHENERGAN) 12.5 mg in sodium chloride 0.9 % 50 mL IVPB, 12.5 mg, Intravenous, Q6H PRN, [name], Christine E, MD, Last Rate: 200 mL/hr at 12/29/21 1926, 12.5 mg at 12/29/21 1926   sodium chloride flush (NS) 0.9 % injection 10-40 mL, 10-40 mL, Intracatheter, Q12H, [name], Christine D, DO, 10 mL at 01/01/22 0857   sodium chloride flush (NS) 0.9 % injection 10-40 mL, 10-40 mL, Intracatheter, PRN, [name], Christine D, DO   sodium chloride flush (NS) 0.9 % injection 10-40 mL, 10-40 mL, Intracatheter, Q12H, Christine Dubois, MD, 10 mL at 01/02/22 0949   sodium chloride flush (NS) 0.9 % injection 10-40 mL, 10-40 mL, Intracatheter, PRN, Christine Dubois, MD   traMADol (ULTRAM) tablet 50 mg, 50 mg, Oral, Q8H  PRN, Christine Dubois, MD, 50 mg at 01/01/22 1210   umeclidinium bromide (INCRUSE ELLIPTA) 62.5 MCG/ACT 1 puff, 1 puff, Inhalation, Daily, [name], Christine D, DO, 1 puff at 01/02/22 0747   vancomycin (VANCOCIN) capsule 500 mg, 500 mg, Oral, Q6H, [name], Christine D, DO, 500 mg at 01/02/22 0542   I/O    Intake/Output Summary (Last 24 hours) at 01/02/2022 1002 Last data filed at 01/02/2022 0216 Gross per 24 hour  Intake 3423.94 ml  Output 2250 ml  Net 1173.94 ml     Physical Exam: Temp:  [97.7 F (36.5 C)-98.7 F (37.1 C)] 98.3 F  (36.8 C) (09/02 0841) Pulse Rate:  [104-125] 115 (09/02 0841) Resp:  [18-20] 18 (09/02 0841) BP: (121-155)/(68-90) 130/90 (09/02 0841) SpO2:  [96 %-100 %] 100 % (09/02 0841)  Temp (24hrs), Avg:98.2 F (36.8 C), Min:97.7 F (36.5 C), Max:98.7 F (37.1 C)  GENERAL: The patient is AO x3, in no acute distress. HEENT: Head is normocephalic and atraumatic. EOMI are intact. Mouth is well hydrated and without lesions. Cushingoid fascies. NECK: Supple. No masses LUNGS: Clear to auscultation. No presence of rhonchi/wheezing/rales. Adequate chest expansion HEART: RRR, normal s1 and s2. ABDOMEN: Tender upon palpation of the lower abdomen, no guarding, no peritoneal signs, and nondistended. BS +. No masses. EXTREMITIES: Without any cyanosis, clubbing, rash, lesions or edema. NEUROLOGIC: AOx3, no focal motor deficit. SKIN: no jaundice, upper extremity bruising  Laboratory Data: CBC:     Component Value Date/Time   WBC 9.4 01/02/2022 0515   RBC 3.28 (L) 01/02/2022 0515   HGB 10.4 (L) 01/02/2022 0515   HCT 31.6 (L) 01/02/2022 0515   PLT 125 (L) 01/02/2022 0515   MCV 96.3 01/02/2022 0515   MCH 31.7 01/02/2022 0515   MCHC 32.9 01/02/2022 0515   RDW 14.7 01/02/2022 0515   LYMPHSABS 1.6 12/23/2021 1524   MONOABS 2.1 (H) 12/23/2021 1524   EOSABS 0.0 12/23/2021 1524   BASOSABS 0.1 12/23/2021 1524   COAG:  Lab Results  Component Value Date   INR 1.2 12/23/2021   INR 1.0 09/11/2020   INR 1.03 09/14/2015    BMP:     Latest Ref Rng & Units 01/02/2022    5:15 AM 12/30/2021    3:42 AM 12/29/2021    3:45 AM  BMP  Glucose 70 - 99 mg/dL 72  65  64   BUN 6 - 20 mg/dL <5  <5  5   Creatinine 0.44 - 1.00 mg/dL 0.69  0.54  0.43   Sodium 135 - 145 mmol/L 136  138  138   Potassium 3.5 - 5.1 mmol/L 5.7  4.0  3.8   Chloride 98 - 111 mmol/L 106  108  108   CO2 22 - 32 mmol/L _0 Calcium 8.9 - 10.3 mg/dL 8.3  8.1  8.2     HEPATIC:     Latest Ref Rng & Units 12/25/2021    6:00 AM 12/23/2021     3:24 PM 10/02/2021    6:20 AM  Hepatic Function  Total Protein 6.5 - 8.1 g/dL 4.2  4.3  4.6   Albumin 3.5 - 5.0 g/dL 1.7  1.9  1.6   AST 15 - 41 U/L 34  21  24   ALT 0 - 44 U/L _1 Alk Phosphatase 38 - 126 U/L 119  150  113   Total Bilirubin 0.3 - 1.2 mg/dL 0.6  0.6  0.7  CARDIAC:  Lab Results  Component Value Date   CKTOTAL 23 (L) 12/23/2021   CKMB 1.5 02/23/2011   TROPONINI <0.03 05/09/2016      Imaging: I personally reviewed and interpreted the available labs, imaging and endoscopic files.   Assessment/Plan: 51 y.o. female with a history of asthma, anxiety, PTSD, DM 2, seizures, HTN, GERD, stricturing Crohn's colitis (diagnosed at age 55) and intolerant to Remicade/Humira, who presented to the ED via EMS after neighbor found her to be unresponsive on the bathroom floor.  Patient was found to have severe sepsis secondary to recurrent C. difficile infection and presence of proctocolitis.  Since admission, the patient was started on metronidazole and vancomycin for management of severe C. difficile.  Since then, her stool output has markedly improved and her abdominal pain has slowly got better with this antibiotic regimen.  Given the fact that she has presented recurrent episodes 2 months after her prior infection, she will need to have long vancomycin taper to manage her disease.  May even consider an FMT outpatient management to avoid further recurrence, which possibly could be performed with spores such as VOWST.  In terms of her Crohn's colitis, she has also presented some improvement of her symptoms while on IV methylprednisolone.  She should continue on this dose for now but we will try to switch her to prednisone 60 mg every day tomorrow and she will need to be discharged on a slow taper as follows: Prednisone 60 mg daily for 1 week, prednisone 50 mg daily for 1 week, prednisone 40 mg daily for 1 week and then decrease by 5 mg weekly.  She will need to follow with one  [facility] -this has been thoroughly discussed with her and the most reasonable option is [facility] with Dr. Nyoka Cowden as she has already established with him for potential management [facility] versus surgical management given her stricturing disease.  -Continue vancomycin 500 mg every 6 hours and Flagyl -Advance diet to GI soft -Continue methylprednisolone 60 mg every day and will switch to prednisone 60 mg daily tomorrow -Prednisone taper as described below upon discharge -She will need to follow-up with Dr. Nyoka Cowden on 01/21/2022 at [facility] -May consider [place] as outpatient versus VOWST  Christine Peppers, MD Gastroenterology and Hepatology [facility]

## 2022-01-02 NOTE — Progress Notes (Signed)
Pt briefs arrived from family members.  Pt rectal tube removed per orders.  Pt tol well.  Peri cares provided and brief in place.

## 2022-01-02 NOTE — Progress Notes (Signed)
PROGRESS NOTE    [name]  FUX:[id number] DOB: 1971/02/11 DOA: 12/23/2021 PCP: Teodoro Kil, PA-C   Brief Narrative:  [name] is a 51 y.o. female with medical history significant for Crohn's disease, hypertension, diabetes, seizures. Patient was found unresponsive at home. She reports chronic diarrhea, up to 10 times a day.  Reports intermittent vomiting-Chronic.  She has been admitted with severe sepsis, present on admission secondary to C. difficile colitis with associated acute metabolic encephalopathy as well as AKI.  She is also thought to have a UTI and is on multiple antibiotics.  GI consulted with recommendations to transfer to tertiary care center, but no beds are currently available at multiple facilities.  She currently appears to be improving and does not need transfer at the moment. GI following and stool output diminishing, but still on CLD.  Assessment & Plan:   Principal Problem:   Severe sepsis ([place]) Active Problems:   Acute metabolic encephalopathy   Proctocolitis   AKI (acute kidney injury) ([place])   UTI (urinary tract infection)   COPD (chronic obstructive pulmonary disease) (HCC)   Crohn's disease ([place])   Diabetes mellitus without complication (HCC)   Lactic acidosis   Essential hypertension   MDD (major depressive disorder)   Seizure (HCC)   C. difficile colitis  Assessment and Plan:  Septic shock, present on admission, due to Cdiff colitis -Appreciate ongoing GI evaluation with CT abdomen and pelvis performed 8/28 with no signs of toxic megacolon noted -Continue now on oral vancomycin as well as IV Flagyl -Continue to monitor closely -Continue slowly advancing diet.  Planning to advance to soft diet today. -Continue to follow GI service recommendation.   Chronic enterocolitis -Diffuse abdominal tenderness, continues to have large-volume diarrhea.  History of Crohn's disease, not on treatment.  Reported blood in stools.  Hemoglobin  stable. -History of Crohn's, appreciate GI evaluation with history of chronic diarrhea.  Follow GI recommendations. -Continue Solu-Medrol as ordered for now -As needed tramadol has been added.  Continue to follow response.   Mild hypomagnesemia -Need to follow electrolytes and further replete as needed.   Acute metabolic encephalopathy-resolved Found down and unresponsive.  Mental status has improved in the ED.  Was likely down for 2 days. -[name] CT with no acute findings -Overall mentation has improved and is pretty much back to her baseline.   E. coli UTI (urinary tract infection) -Patient will be completing antibiotic therapy for ESBL after today's doses; she will have had a total of 7 days treatment between meropenem and Bactrim. -Attempt voiding trial with removal of Foley catheter later today.   AKI (acute kidney injury)-resolved -Creatinine 1.58, baseline about 0.6-1.  Likely from dehydration, hypotension, sepsis. -Continue IV fluid and monitor due to ongoing liquid stool output -Continue to follow renal function trend -Currently resolved; renal function back to normal.  Mild hyperkalemia -In the setting of electrolytes over repletion -Potassium 5.7 -Holding maintenance supplementation at this point given no further significant GI losses. -Continue to follow electrolytes trend.   Seizure ([place]) -Continue Keppra -No seizure activity appreciated.   Essential hypertension-stable Initial hypotension systolic down to 02R.  Likely from no oral intake in the past 2 days, and sepsis presentation. -Not on antihypertensives. -Vital signs has remained stable.   Diabetes mellitus without complication ([place]) -Not on medication.  And currently not needing hypoglycemic agents. - HgbA1c 4.7% -Continue to follow CBGs.  Right upper extremity DVT -Right upper extremity still tender/indurated; patient reported less painful. -Ultrasound evaluation demonstrated 2 blood  clots -PICC line  has been removed from patient and started on Eliquis. -Plan is for 32-monthtreatment.   DVT prophylaxis:SCDs Code Status: Full Family Communication: No family at bedside. Disposition Plan:  Status is: Inpatient Remains inpatient appropriate because: Need for pressors and IV medications.   Consultants:  GI   Procedures:  None   Antimicrobials:  Anti-infectives (From admission, onward)    Start     Dose/Rate Route Frequency Ordered Stop   12/28/21 1000  sulfamethoxazole-trimethoprim (BACTRIM DS) 800-160 MG per tablet 1 tablet        1 tablet Oral Every 12 hours 12/28/21 0844 01/01/22 2214   12/26/21 1100  meropenem (MERREM) 1 g in sodium chloride 0.9 % 100 mL IVPB  Status:  Discontinued        1 g 200 mL/hr over 30 Minutes Intravenous Every 8 hours 12/26/21 1013 12/28/21 0844   12/24/21 1600  metroNIDAZOLE (FLAGYL) IVPB 500 mg  Status:  Discontinued        500 mg 100 mL/hr over 60 Minutes Intravenous Every 12 hours 12/24/21 0729 12/24/21 1141   12/24/21 1230  vancomycin (VANCOCIN) capsule 500 mg        500 mg Oral Every 6 hours 12/24/21 1141 01/07/22 1159   12/24/21 1230  metroNIDAZOLE (FLAGYL) IVPB 500 mg        500 mg 100 mL/hr over 60 Minutes Intravenous Every 8 hours 12/24/21 1141 01/07/22 1229   12/24/21 0400  ceFEPIme (MAXIPIME) 2 g in sodium chloride 0.9 % 100 mL IVPB  Status:  Discontinued        2 g 200 mL/hr over 30 Minutes Intravenous Every 12 hours 12/23/21 1535 12/23/21 2224   12/24/21 0400  cefTRIAXone (ROCEPHIN) 2 g in sodium chloride 0.9 % 100 mL IVPB  Status:  Discontinued        2 g 200 mL/hr over 30 Minutes Intravenous Every 24 hours 12/23/21 2224 12/26/21 1013   12/24/21 0400  metroNIDAZOLE (FLAGYL) IVPB 500 mg        500 mg 100 mL/hr over 60 Minutes Intravenous  Once 12/23/21 2224 12/24/21 0430   12/23/21 1545  ceFEPIme (MAXIPIME) 2 g in sodium chloride 0.9 % 100 mL IVPB        2 g 200 mL/hr over 30 Minutes Intravenous  Once 12/23/21 1530 12/23/21 1615    12/23/21 1530  aztreonam (AZACTAM) 2 g in sodium chloride 0.9 % 100 mL IVPB  Status:  Discontinued        2 g 200 mL/hr over 30 Minutes Intravenous  Once 12/23/21 1523 12/23/21 1530   12/23/21 1530  metroNIDAZOLE (FLAGYL) IVPB 500 mg        500 mg 100 mL/hr over 60 Minutes Intravenous  Once 12/23/21 1523 12/23/21 1630      Subjective: Feeling ready to further advance diet; still complaining of some abdominal pain but much improved.  Flexi-Seal and Foley catheter has been successfully removed.  Patient reports no nausea vomiting.  Objective: Vitals:   01/02/22 0412 01/02/22 0747 01/02/22 0841 01/02/22 1412  BP: 121/68  (!) 130/90 113/79  Pulse: (!) 111  (!) 115 (!) 129  Resp: 18  18 18   Temp: 98.2 F (36.8 C)  98.3 F (36.8 C) 98.1 F (36.7 C)  TempSrc:   Oral Oral  SpO2: 97% 97% 100% 94%  Weight:      Height:        Intake/Output Summary (Last 24 hours) at 01/02/2022 1445 Last data filed  at 01/02/2022 0216 Gross per 24 hour  Intake 1940.77 ml  Output 2250 ml  Net -309.23 ml   Filed Weights   12/27/21 0500 12/29/21 0421 12/29/21 1805  Weight: 57.6 kg 59.3 kg 58.4 kg    Examination: General exam: Alert, awake, oriented x 3; reporting improvement in her abdominal pain and so far tolerating liquid diet.  Feeling ready to advance further.  Foley catheter, Flexi-Seal has been removed successfully. Respiratory system: Clear to auscultation. Respiratory effort normal.  No using accessory muscles. Cardiovascular system: No rubs, no gallops, no JVD. Gastrointestinal system: Abdomen is slightly distended, vaguely tender to deep palpation diffusely; positive bowel sounds.  No guarding. Central nervous system: Alert and oriented. No focal neurological deficits. Extremities: No cyanosis or clubbing. Skin: No petechiae. Psychiatry: Judgement and insight appear normal. Mood & affect appropriate.   Data Reviewed: I have personally reviewed following labs and imaging  studies  CBC: Recent Labs  Lab 12/27/21 0414 12/28/21 0359 12/29/21 0800 12/30/21 0342 01/02/22 0515  WBC 10.4 9.0 10.6* 8.0 9.4  HGB 9.8* 9.5* 10.9* 10.1* 10.4*  HCT 30.5* 29.1* 32.8* 31.0* 31.6*  MCV 95.9 96.4 95.3 96.0 96.3  PLT 165 174 231 220 308*   Basic Metabolic Panel: Recent Labs  Lab 12/27/21 0414 12/28/21 0359 12/29/21 0345 12/30/21 0342 01/02/22 0515  NA 136 140 138 138 136  K 3.4* 3.3* 3.8 4.0 5.7*  CL 107 109 108 108 106  CO2 23 27 25 25 26   GLUCOSE 101* 78 64* 65* 72  BUN 10 6 5* <5* <5*  CREATININE 0.50 0.44 0.43* 0.54 0.69  CALCIUM 8.0* 8.1* 8.2* 8.1* 8.3*  MG 1.7 1.5* 1.6* 1.9  --    GFR: Estimated Creatinine Clearance: 71 mL/min (by C-G formula based on SCr of 0.69 mg/dL).  CBG: Recent Labs  Lab 12/29/21 0521 12/30/21 0530 12/31/21 0500 01/01/22 0506 01/01/22 0551  GLUCAP 75 65* 76 62* 73    Sepsis Labs: No results for input(s): "PROCALCITON", "LATICACIDVEN" in the last 168 hours.   Recent Results (from the past 240 hour(s))  Blood Culture (routine x 2)     Status: None   Collection Time: 12/23/21  3:27 PM   Specimen: Leg; Blood  Result Value Ref Range Status   Specimen Description LEG  Final   Special Requests   Final    BOTTLES DRAWN AEROBIC AND ANAEROBIC Blood Culture results may not be optimal due to an excessive volume of blood received in culture bottles   Culture   Final    NO GROWTH 5 DAYS Performed at [facility], [address], [place], [place] [address]    Report Status 12/28/2021 FINAL  Final  Resp Panel by RT-PCR (Flu A&B, Covid) Anterior Nasal Swab     Status: None   Collection Time: 12/23/21  3:45 PM   Specimen: Anterior Nasal Swab  Result Value Ref Range Status   SARS Coronavirus 2 by RT PCR NEGATIVE NEGATIVE Final    Comment: (NOTE) SARS-CoV-2 target nucleic acids are NOT DETECTED.  The SARS-CoV-2 RNA is generally detectable in upper respiratory specimens during the acute phase of infection. The  lowest concentration of SARS-CoV-2 viral copies this assay can detect is 138 copies/mL. A negative result does not preclude SARS-Cov-2 infection and should not be used as the sole basis for treatment or other patient management decisions. A negative result may occur with  improper specimen collection/handling, submission of specimen other than nasopharyngeal swab, presence of viral mutation(s) within the  areas targeted by this assay, and inadequate number of viral copies(<138 copies/mL). A negative result must be combined with clinical observations, patient history, and epidemiological information. The expected result is Negative.  Fact Sheet for Patients:  EntrepreneurPulse.com.au  Fact Sheet for Healthcare Providers:  IncredibleEmployment.be  This test is no t yet approved or cleared by the [place] FDA and  has been authorized for detection and/or diagnosis of SARS-CoV-2 by FDA under an Emergency Use Authorization (EUA). This EUA will remain  in effect (meaning this test can be used) for the duration of the COVID-19 declaration under Section 564(b)(1) of the Act, 21 U.S.C.section 360bbb-3(b)(1), unless the authorization is terminated  or revoked sooner.       Influenza A by PCR NEGATIVE NEGATIVE Final   Influenza B by PCR NEGATIVE NEGATIVE Final    Comment: (NOTE) The Xpert Xpress SARS-CoV-2/FLU/RSV plus assay is intended as an aid in the diagnosis of influenza from Nasopharyngeal swab specimens and should not be used as a sole basis for treatment. Nasal washings and aspirates are unacceptable for Xpert Xpress SARS-CoV-2/FLU/RSV testing.  Fact Sheet for Patients: EntrepreneurPulse.com.au  Fact Sheet for Healthcare Providers: IncredibleEmployment.be  This test is not yet approved or cleared by the [place] FDA and has been authorized for detection and/or diagnosis of SARS-CoV-2 by FDA under  an Emergency Use Authorization (EUA). This EUA will remain in effect (meaning this test can be used) for the duration of the COVID-19 declaration under Section 564(b)(1) of the Act, 21 U.S.C. section 360bbb-3(b)(1), unless the authorization is terminated or revoked.  Performed at [facility], [address], [place], [place] [address]   Urine Culture     Status: Abnormal   Collection Time: 12/23/21  4:41 PM   Specimen: In/Out Cath Urine  Result Value Ref Range Status   Specimen Description   Final    IN/OUT CATH URINE Performed at [facility], [address], [place], [place] [address]    Special Requests   Final    NONE Performed at [facility], [address], [place], [place] [address]    Culture (A)  Final    >=100,000 COLONIES/mL ESCHERICHIA COLI Susceptibility Pattern Suggests Possibility of an Extended Spectrum Beta Lactamase Producer. Contact Laboratory Within 7 Days if Confirmation Warranted. Performed at [facility], [place] [address] [place],  [address]    Report Status 12/26/2021 FINAL  Final   Organism ID, Bacteria ESCHERICHIA COLI (A)  Final      Susceptibility   Escherichia coli - MIC*    AMPICILLIN >=32 RESISTANT Resistant     CEFAZOLIN >=64 RESISTANT Resistant     CEFEPIME 16 RESISTANT Resistant     CEFTRIAXONE >=64 RESISTANT Resistant     CIPROFLOXACIN >=4 RESISTANT Resistant     GENTAMICIN <=1 SENSITIVE Sensitive     IMIPENEM <=0.25 SENSITIVE Sensitive     NITROFURANTOIN <=16 SENSITIVE Sensitive     TRIMETH/SULFA <=20 SENSITIVE Sensitive     AMPICILLIN/SULBACTAM >=32 RESISTANT Resistant     PIP/TAZO 16 SENSITIVE Sensitive     * >=100,000 COLONIES/mL ESCHERICHIA COLI  MRSA Next Gen by PCR, Nasal     Status: None   Collection Time: 12/23/21 11:01 PM   Specimen: Nasal Mucosa; Nasal Swab  Result Value Ref Range Status   MRSA by PCR Next Gen NOT DETECTED NOT DETECTED Final    Comment: (NOTE) The GeneXpert MRSA Assay (FDA approved for NASAL  specimens only), is one component of a comprehensive MRSA colonization  surveillance program. It is not intended to diagnose MRSA infection nor to guide or monitor treatment for MRSA infections. Test performance is not FDA approved in patients less than 53 years old. Performed at [facility], [address], [place], [place] [address]   C Difficile Quick Screen w PCR reflex     Status: Abnormal   Collection Time: 12/24/21 12:24 AM   Specimen: Stool  Result Value Ref Range Status   C Diff antigen POSITIVE (A) NEGATIVE Final   C Diff toxin NEGATIVE NEGATIVE Final   C Diff interpretation Results are indeterminate. See PCR results.  Final    Comment: Performed at [facility], [address] [place], [place] [address]  C. Diff by PCR, Reflexed     Status: Abnormal   Collection Time: 12/24/21 12:24 AM  Result Value Ref Range Status   Toxigenic C. Difficile by PCR POSITIVE (A) NEGATIVE Final    Comment: Positive for toxigenic C. difficile with little to no toxin production. Only treat if clinical presentation suggests symptomatic illness. Performed at [facility], [place] [address] [place], [place] [address]     Radiology Studies: No results found.  Scheduled Meds:  amitriptyline  100 mg Oral QHS   apixaban  10 mg Oral BID   Followed by   Derrill Memo ON 01/06/2022] apixaban  5 mg Oral BID   Chlorhexidine Gluconate Cloth  6 each Topical Daily   feeding supplement  237 mL Oral BID BM   levETIRAcetam  750 mg Oral BID   methylPREDNISolone (SOLU-MEDROL) injection  60 mg Intravenous Daily   pantoprazole  40 mg Oral BID   sodium chloride flush  10-40 mL Intracatheter Q12H   sodium chloride flush  10-40 mL Intracatheter Q12H   umeclidinium bromide  1 puff Inhalation Daily   vancomycin  500 mg Oral Q6H   Continuous Infusions:  lactated ringers 75 mL/hr at 01/02/22 1203   metronidazole 500 mg (01/02/22 1212)   promethazine (PHENERGAN) injection (IM or IVPB) 12.5 mg (12/29/21 1926)      LOS: 10 days    Roque Lias [facility]  If 7PM-7AM, please contact night-coverage [email or web address] 01/02/2022, 2:45 PM

## 2022-01-03 DIAGNOSIS — I10 Essential (primary) hypertension: Secondary | ICD-10-CM | POA: Diagnosis not present

## 2022-01-03 DIAGNOSIS — A419 Sepsis, unspecified organism: Secondary | ICD-10-CM | POA: Diagnosis not present

## 2022-01-03 DIAGNOSIS — G9341 Metabolic encephalopathy: Secondary | ICD-10-CM | POA: Diagnosis not present

## 2022-01-03 DIAGNOSIS — I82621 Acute embolism and thrombosis of deep veins of right upper extremity: Secondary | ICD-10-CM

## 2022-01-03 DIAGNOSIS — K50112 Crohn's disease of large intestine with intestinal obstruction: Secondary | ICD-10-CM

## 2022-01-03 DIAGNOSIS — E119 Type 2 diabetes mellitus without complications: Secondary | ICD-10-CM

## 2022-01-03 DIAGNOSIS — J449 Chronic obstructive pulmonary disease, unspecified: Secondary | ICD-10-CM | POA: Diagnosis not present

## 2022-01-03 DIAGNOSIS — A0472 Enterocolitis due to Clostridium difficile, not specified as recurrent: Secondary | ICD-10-CM | POA: Diagnosis not present

## 2022-01-03 DIAGNOSIS — R569 Unspecified convulsions: Secondary | ICD-10-CM | POA: Diagnosis not present

## 2022-01-03 DIAGNOSIS — N390 Urinary tract infection, site not specified: Secondary | ICD-10-CM | POA: Diagnosis not present

## 2022-01-03 DIAGNOSIS — N179 Acute kidney failure, unspecified: Secondary | ICD-10-CM | POA: Diagnosis not present

## 2022-01-03 DIAGNOSIS — K529 Noninfective gastroenteritis and colitis, unspecified: Secondary | ICD-10-CM | POA: Diagnosis not present

## 2022-01-03 LAB — BASIC METABOLIC PANEL
Anion gap: 5 (ref 5–15)
BUN: <5 mg/dL — ABNORMAL LOW (ref 6–20)
CO2: 26 mmol/L (ref 22–32)
Calcium: 8.2 mg/dL — ABNORMAL LOW (ref 8.9–10.3)
Chloride: 106 mmol/L (ref 98–111)
Creatinine, Ser: 0.62 mg/dL (ref 0.44–1.00)
GFR, Estimated: >60 mL/min (ref >60)
Glucose, Bld: 87 mg/dL (ref 70–99)
Potassium: 3 mmol/L — ABNORMAL LOW (ref 3.5–5.1)
Sodium: 137 mmol/L (ref 135–145)

## 2022-01-03 MED ORDER — PREDNISONE 10 MG PO TABS: ORAL_TABLET | ORAL | 0 refills | Status: DC

## 2022-01-03 MED ORDER — SPIRIVA RESPIMAT 2.5 MCG/ACT IN AERS: 1.0000 | INHALATION_SPRAY | Freq: Every day | RESPIRATORY_TRACT | 3 refills | Status: DC

## 2022-01-03 MED ORDER — ACETAMINOPHEN 325 MG PO TABS: 650.0000 mg | ORAL_TABLET | Freq: Four times a day (QID) | ORAL | Status: DC | PRN

## 2022-01-03 MED ORDER — TRAMADOL HCL 50 MG PO TABS: 50.0000 mg | ORAL_TABLET | Freq: Three times a day (TID) | ORAL | 0 refills | Status: DC | PRN

## 2022-01-03 MED ORDER — VANCOMYCIN HCL 250 MG PO CAPS: ORAL_CAPSULE | ORAL | 0 refills | Status: DC

## 2022-01-03 MED ORDER — PANTOPRAZOLE SODIUM 40 MG PO TBEC: 40.0000 mg | DELAYED_RELEASE_TABLET | Freq: Two times a day (BID) | ORAL | 2 refills | Status: DC

## 2022-01-03 MED ORDER — PREDNISONE 20 MG PO TABS: 60.0000 mg | ORAL_TABLET | Freq: Every day | ORAL | Status: DC

## 2022-01-03 MED ORDER — APIXABAN 5 MG PO TABS: ORAL_TABLET | ORAL | 3 refills | Status: DC

## 2022-01-03 MED ORDER — ENSURE ENLIVE PO LIQD: 237.0000 mL | Freq: Two times a day (BID) | ORAL | Status: AC

## 2022-01-03 NOTE — Progress Notes (Signed)
When entering pt room last night approx 2200, noted that pt face/chest were extremely flushed in color, this had not been noted previously.  Pt states it felt like her face was warm.  Pt vitals recently taken, no fever, VSS per pt baseline.  Temp at this time 98.3 oral.  Pt states no itching, burning to face, no difficulty breathing or swallowing.  While pt was discussing this, also noted that pt appears to have increased edema to RUE at elbow area along with increased discoloration (purple/red) compared with what was noted during previous assessment done by this nurse.  Pt states she felt arm was more discolored as well.  Hand grasps equal, denies N/T, both UE warm to palp.  Provider notified of both events, pt on eliquis for current DVT in [place].  Orders received for hydroxyzine tid prn for flushing.  Pt given dose with meds.  Will continue to monitor.

## 2022-01-03 NOTE — Progress Notes (Signed)
[name], M.D. Gastroenterology & Hepatology   Interval History:  Patient reports feeling much better today and states she has very mild pain in her lower abdomen only when he palpates her lower abdominal region.  Reports that she had 3 bowel movements since yesterday one of them was massive but no presence of melena or red blood in the stool.  No nausea, vomiting, fever or chills.  In fact she was able to finish her meal yesterday and feels very happy. Notably, she had an episode of flushing in her face and chest overnight of unclear etiology.  Inpatient Medications:  Current Facility-Administered Medications:    acetaminophen (TYLENOL) tablet 650 mg, 650 mg, Oral, Q6H PRN, 650 mg at 01/03/22 0910 **OR** acetaminophen (TYLENOL) suppository 650 mg, 650 mg, Rectal, Q6H PRN, Emokpae, Ejiroghene E, MD   albuterol (PROVENTIL) (2.5 MG/3ML) 0.083% nebulizer solution 3 mL, 3 mL, Inhalation, Q4H PRN, [name], Pratik D, DO   ALPRAZolam [name]) tablet 1 mg, 1 mg, Oral, TID PRN, [name], Pratik D, DO, 1 mg at 01/03/22 6568   amitriptyline (ELAVIL) tablet 100 mg, 100 mg, Oral, QHS, [name], Pratik D, DO, 100 mg at 01/02/22 2230   apixaban (ELIQUIS) tablet 10 mg, 10 mg, Oral, BID, 10 mg at 01/03/22 0855 **FOLLOWED BY** [START ON 01/06/2022] apixaban (ELIQUIS) tablet 5 mg, 5 mg, Oral, BID, [name], MD   Chlorhexidine Gluconate Cloth 2 % PADS 6 each, 6 each, Topical, Daily, Adefeso, Oladapo, DO, 6 each at 01/03/22 0856   cyclobenzaprine (FLEXERIL) tablet 10 mg, 10 mg, Oral, TID PRN, [name], Pratik D, DO, 10 mg at 01/03/22 0903   feeding supplement (ENSURE ENLIVE / ENSURE PLUS) liquid 237 mL, 237 mL, Oral, BID BM, [name], MD, 237 mL at 01/02/22 1540   fentaNYL (SUBLIMAZE) injection 25 mcg, 25 mcg, Intravenous, Q2H PRN, [name], MD, 25 mcg at 01/02/22 2304   hydrOXYzine (ATARAX) tablet 25 mg, 25 mg, Oral, TID PRN, Zierle-Ghosh, Asia B, DO, 25 mg at 01/02/22 2304   lactated ringers infusion, ,  Intravenous, Continuous, [name], Pratik D, DO, Last Rate: 75 mL/hr at 01/03/22 0504, Infusion Verify at 01/03/22 0504   levETIRAcetam (KEPPRA) tablet 750 mg, 750 mg, Oral, BID, Emokpae, Ejiroghene E, MD, 750 mg at 01/03/22 0855   liver oil-zinc oxide (DESITIN) 40 % ointment, , Topical, TID, [name], MD, Given at 01/03/22 0911   metroNIDAZOLE (FLAGYL) IVPB 500 mg, 500 mg, Intravenous, Q8H, [name], Pratik D, DO, Stopped at 01/03/22 0503   ondansetron (ZOFRAN) injection 4 mg, 4 mg, Intravenous, Q6H PRN, [name], Pratik D, DO, 4 mg at 01/01/22 1507   Oral care mouth rinse, 15 mL, Mouth Rinse, PRN, [name], Pratik D, DO   pantoprazole (PROTONIX) EC tablet 40 mg, 40 mg, Oral, BID, [name], MD, 40 mg at 01/03/22 0856   polyethylene glycol (MIRALAX / GLYCOLAX) packet 17 g, 17 g, Oral, Daily PRN, Emokpae, Ejiroghene E, MD   [START ON 01/04/2022] predniSONE (DELTASONE) tablet 60 mg, 60 mg, Oral, Q breakfast, Montez Morita, Keiyon Plack, MD   promethazine (PHENERGAN) 12.5 mg in sodium chloride 0.9 % 50 mL IVPB, 12.5 mg, Intravenous, Q6H PRN, Emokpae, Ejiroghene E, MD, Last Rate: 200 mL/hr at 12/29/21 1926, 12.5 mg at 12/29/21 1926   sodium chloride flush (NS) 0.9 % injection 10-40 mL, 10-40 mL, Intracatheter, Q12H, [name], Pratik D, DO, 10 mL at 01/01/22 0857   sodium chloride flush (NS) 0.9 % injection 10-40 mL, 10-40 mL, Intracatheter, PRN, [name], Pratik D, DO  sodium chloride flush (NS) 0.9 % injection 10-40 mL, 10-40 mL, Intracatheter, Q12H, [name], MD, 10 mL at 01/02/22 0949   sodium chloride flush (NS) 0.9 % injection 10-40 mL, 10-40 mL, Intracatheter, PRN, [name], MD   traMADol Veatrice Bourbon) tablet 50 mg, 50 mg, Oral, Q8H PRN, [name], MD, 50 mg at 01/01/22 1210   umeclidinium bromide (INCRUSE ELLIPTA) 62.5 MCG/ACT 1 puff, 1 puff, Inhalation, Daily, [name], Pratik D, DO, 1 puff at 01/03/22 0719   vancomycin (VANCOCIN) capsule 500 mg, 500 mg, Oral, Q6H, [name], Pratik D, DO, 500 mg at 01/03/22  0519   I/O    Intake/Output Summary (Last 24 hours) at 01/03/2022 1001 Last data filed at 01/03/2022 0924 Gross per 24 hour  Intake 2994.73 ml  Output 3325 ml  Net -330.27 ml     Physical Exam: Temp:  [98.1 F (36.7 C)-98.6 F (37 C)] 98.6 F (37 C) (09/03 0616) Pulse Rate:  [104-129] 112 (09/03 0616) Resp:  [18] 18 (09/03 0616) BP: (108-113)/(68-79) 112/68 (09/03 0616) SpO2:  [94 %-96 %] 95 % (09/03 0719)  Temp (24hrs), Avg:98.4 F (36.9 C), Min:98.1 F (36.7 C), Max:98.6 F (37 C) GENERAL: The patient is AO x3, in no acute distress. HEENT: Head is normocephalic and atraumatic. EOMI are intact. Mouth is well hydrated and without lesions. Cushingoid fascies. NECK: Supple. No masses LUNGS: Clear to auscultation. No presence of rhonchi/wheezing/rales. Adequate chest expansion HEART: RRR, normal s1 and s2. ABDOMEN: non tender, no guarding, no peritoneal signs, and nondistended. BS +. No masses. EXTREMITIES: Without any cyanosis, clubbing, rash, lesions or edema. NEUROLOGIC: AOx3, no focal motor deficit. SKIN: no jaundice, upper extremity bruising  Laboratory Data: CBC:     Component Value Date/Time   WBC 9.4 01/02/2022 0515   RBC 3.28 (L) 01/02/2022 0515   HGB 10.4 (L) 01/02/2022 0515   HCT 31.6 (L) 01/02/2022 0515   PLT 125 (L) 01/02/2022 0515   MCV 96.3 01/02/2022 0515   MCH 31.7 01/02/2022 0515   MCHC 32.9 01/02/2022 0515   RDW 14.7 01/02/2022 0515   LYMPHSABS 1.6 12/23/2021 1524   MONOABS 2.1 (H) 12/23/2021 1524   EOSABS 0.0 12/23/2021 1524   BASOSABS 0.1 12/23/2021 1524   COAG:  Lab Results  Component Value Date   INR 1.2 12/23/2021   INR 1.0 09/11/2020   INR 1.03 09/14/2015    BMP:     Latest Ref Rng & Units 01/03/2022    5:07 AM 01/02/2022    5:15 AM 12/30/2021    3:42 AM  BMP  Glucose 70 - 99 mg/dL 87  72  65   BUN 6 - 20 mg/dL <5  <5  <5   Creatinine 0.44 - 1.00 mg/dL 0.62  0.69  0.54   Sodium 135 - 145 mmol/L 137  136  138   Potassium 3.5 - 5.1  mmol/L 3.0  5.7  4.0   Chloride 98 - 111 mmol/L 106  106  108   CO2 22 - 32 mmol/L _0 Calcium 8.9 - 10.3 mg/dL 8.2  8.3  8.1     HEPATIC:     Latest Ref Rng & Units 12/25/2021    6:00 AM 12/23/2021    3:24 PM 10/02/2021    6:20 AM  Hepatic Function  Total Protein 6.5 - 8.1 g/dL 4.2  4.3  4.6   Albumin 3.5 - 5.0 g/dL 1.7  1.9  1.6   AST 15 - 41 U/L  34  21  24   ALT 0 - 44 U/L _0 Alk Phosphatase 38 - 126 U/L 119  150  113   Total Bilirubin 0.3 - 1.2 mg/dL 0.6  0.6  0.7     CARDIAC:  Lab Results  Component Value Date   CKTOTAL 23 (L) 12/23/2021   CKMB 1.5 02/23/2011   TROPONINI <0.03 05/09/2016      Imaging: I personally reviewed and interpreted the available labs, imaging and endoscopic files.   Assessment/Plan: 51 y.o. female with a history of asthma, anxiety, PTSD, DM 2, seizures, HTN, GERD, stricturing Crohn's colitis (diagnosed at age 89) and intolerant to Remicade/Humira, who presented to the ED via EMS after neighbor found her to be unresponsive on the bathroom floor.  Patient was found to have severe sepsis secondary to recurrent C. difficile infection and presence of proctocolitis.   Since admission, the patient was started on metronidazole and vancomycin for management of severe C. difficile.  Since then, her stool output has markedly improved and her abdominal pain has  almost resolved.  Given the fact that she has presented recurrent episodes 2 months after her prior infection, she will need to have long vancomycin taper to manage her disease.  May even consider an FMT outpatient management to avoid further recurrence -this will need to be discussed as outpatient given the fact that her chronic disease has not been completely controlled yet.   In terms of her Crohn's colitis, she has also presented improvement of her symptoms while on IV methylprednisolone.  We will switch her tomorrow to prednisone 60 mg every day tomorrow -if tolerating, she will need to  be discharged on a slow taper.  She will need to follow with one [facility] -this has been thoroughly discussed with her and the most reasonable option is [facility] with Dr. Nyoka Cowden as she has already established with him for potential management Stelara versus surgical management given her stricturing disease.   -Continue vancomycin 500 mg every 6 hours and Flagyl -Upon discharge, she will need to take the following vancomycin taper: 500 mg orally 4 times daily for 14 days, then 500 mg orally 2 times daily for 7 days, then 500 mg orally once daily for 7 days, then 500 mg orally every other day for 4 weeks -Continue GI soft -Continue methylprednisolone 60 mg every day and will switch to prednisone 60 mg daily tomorrow -Prednisone taper as described below upon discharge: Prednisone 60 mg daily for 1 week, prednisone 50 mg daily for 1 week, prednisone 40 mg daily for 1 week and then decrease by 5 mg weekly. -She will need to follow-up with Dr. Nyoka Cowden on 01/21/2022 at [facility] -May consider Octa as outpatient versus VOWST, will discuss as outpatient - Patient will follow up in local GI clinic in 4-6 weeks.  Christine Peppers, MD Gastroenterology and Hepatology [facility]

## 2022-01-03 NOTE — Progress Notes (Signed)
   01/03/22 1255  Assess: MEWS Score  Temp 98.6 F (37 C)  BP 126/65  MAP (mmHg) 85  Pulse Rate (!) 128  Resp 18  Level of Consciousness Alert  SpO2 96 %  O2 Device Room Air  Assess: MEWS Score  MEWS Temp 0  MEWS Systolic 0  MEWS Pulse 2  MEWS RR 0  MEWS LOC 0  MEWS Score 2  MEWS Score Color Yellow  Assess: if the MEWS score is Yellow or Red  Were vital signs taken at a resting state? Yes  Focused Assessment No change from prior assessment  Does the patient meet 2 or more of the SIRS criteria? No  MEWS guidelines implemented *See Row Information* No, previously yellow, continue vital signs every 4 hours  Assess: SIRS CRITERIA  SIRS Temperature  0  SIRS Pulse 1  SIRS Respirations  0  SIRS WBC 1  SIRS Score Sum  2

## 2022-01-03 NOTE — Discharge Summary (Signed)
Physician Discharge Summary   Patient: [name] MRN: [id number] DOB: Aug 12, 1970  Admit date:     12/23/2021  Discharge date: 01/03/22  Discharge Physician: [name]   PCP: Teodoro Kil, PA-C   Recommendations at discharge:  CBC to follow hemoglobin trend/stability Repeat complete metabolic panel to follow electrolytes, renal function and LFTs Make sure patient has follow-up with gastroenterology as instructed and had taking medications as prescribed Follow-up resolution of right upper extremity swelling, pain and repeat ultrasound in 62-monthto assure resolution of blood clots, to stop  Eliquis as instructed.  Discharge Diagnoses: Principal Problem:   Septic shock ([place] Active Problems:   Acute metabolic encephalopathy   Proctocolitis   AKI (acute kidney injury) ([place]   UTI (urinary tract infection)   COPD (chronic obstructive pulmonary disease) (HCC)   Crohn's disease (HCC)   Diabetes mellitus without complication (HCC)   Lactic acidosis   Essential hypertension   MDD (major depressive disorder)   Seizure (HCC)   C. difficile colitis   Acute deep vein thrombosis (DVT) of brachial vein of right upper extremity ([facility]  Brief Hospital admission course: As per H&P written by Dr. EDenton Brickon 12/23/2021 [name] a 51y.o. female with medical history significant for Crohn's disease, hypertension, diabetes, seizures. Patient was found unresponsive at home.  Neighbor went to check on her as she had not been seen for 3 days.  Patient was found on floor unresponsive, in the bathroom with bloody stool noted from bedroom to bathroom.  EMS unable to get blood pressure, and peripheral pulses were nonpalpable. EMS established IO access, gave 150 mils of saline, BP improved to 80s patient was lethargic, more alert by the time she came to the ED. At the time of my evaluation, patient is awake alert oriented x3.  She thinks she passed out 2 days ago on Monday morning.  She tells  me the last thing she remembers is getting out of the bathtub, walking on the bathroom and falling, she tried to get up have not seen to use a support to get up.  She says she kept falling multiple times that she tried to get up.  She is unaware of having bloody stools prior to passing out.  Does remember having any dizziness.  She denies chest pain or difficulty breathing. She reports chronic diarrhea, up to 10 times a day.  Reports intermittent vomiting-Chronic.   ED Course: Initial hypotension, systolic down to 67, due to reports of large amount of blood in stool, and initial hypotension, 2 units PRBC was ordered,.  Hgb returned stable at 11.1, order for blood transfusion was canceled, but 1 unit of blood was already hanging.   Temperature down to 93.6.  Heart rate 90-106.  Respiratory rate 16-24. Leukocytosis of 24.2. Cr elevated 1.54 Lactic acidosis 5.9 > 2.8. UA with moderate leukocytes many bacteria.   CT abdomen- Mild proctocolitis involving the rectosigmoid, without significant change. No evidence of abscess or other complication. IV metronidazole and cefepime given. Central line placed.  Levophed was ordered but this was not started as patient's blood pressure improved after 3 L bolus of fluids. Hospitalist to admit for sepsis secondary to proctocolitis, likely UTI.  Assessment and Plan: Septic shock, present on admission, due to Cdiff colitis -Appreciate ongoing GI evaluation with CT abdomen and pelvis performed 8/28 with no signs of toxic megacolon noted -Continue now on oral vancomycin as instructed by GI (tapering protocol). -Continue to monitor closely -Continue soft diet, PPI and outpatient  follow-up with GI service. -Outpatient FMT or VOWST might be considered if needed.   Chronic enterocolitis -Diffuse abdominal tenderness, continues to have large-volume diarrhea.   -History of Crohn's disease, not on treatment.  Patient has reported blood in stools.  Hemoglobin stable, no  overt bleeding appreciated. -History of Crohn's, appreciate GI evaluation with history of chronic diarrhea.  Follow GI recommendations. -Continue steroids tapering and outpatient follow-up with GI service. -As needed tramadol has been added to help with severe pain.   Mild hypomagnesemia -Repleted and within normal limits at discharge -Continue to follow electrolytes trend.   Acute metabolic encephalopathy-resolved Found down and unresponsive.  Mental status has improved in the ED.  Was likely down for 2 days. -Head CT with no acute findings -Overall mentation has improved and is pretty much back to her baseline. -Patient advised to be compliant with medication management and to maintain adequate hydration.   E. coli UTI (urinary tract infection) -Patient completed 7 days of antibiotics using meropenem and Bactrim while inpatient. -Successful voiding trial and removal of Foley catheter prior to discharge. -Denies dysuria or bladder spasms. -Advised to maintain adequate hydration.  AKI (acute kidney injury)-resolved -Creatinine 1.58, baseline about 0.6-1.  Likely from dehydration, hypotension, sepsis. -Continue to follow renal function trend -Currently resolved; renal function back to normal. -Patient advised to maintain adequate hydration. -Adequately tolerating diet at time of discharge.   Hypokalemia/mild hyperkalemia -In the setting of electrolytes over repletion -Potassium 5.7 -Further maintenance supplementation was removed and at discharge potassium level within normal limits. -Patient advised to maintain adequate hydration.   Seizure ([place]) -Continue Keppra -No seizure activity appreciated.   Essential hypertension-stable -Initial hypotension systolic down to 53M.  Likely from no oral intake in the past 2 days, and sepsis presentation. -Not on antihypertensives. -Vital signs has remained stable. -Reassess blood pressure at follow-up visit and initiate treatment with  antihypertensive medications if required.   Diabetes mellitus without complication ([place]) -Not on medication.  And currently not needing hypoglycemic agents. - HgbA1c 4.7% -Continue to follow CBGs and continue to follow modified carbohydrate diet..   Right upper extremity DVT -Right upper extremity still tender/indurated; patient reported less painful. -Ultrasound evaluation demonstrated 2 blood clots -PICC line has been removed from patient and started on Eliquis. -Plan is for 31-monthtreatment.   Consultants: Gastroenterology service Procedures performed: See below for x-ray reports Disposition: Home Diet recommendation: Soft modified carbohydrate diet.  DISCHARGE MEDICATION: Allergies as of 01/03/2022       Reactions   Codeine Shortness Of Breath, Swelling, Rash   Throat swelling   Dilaudid [hydromorphone Hcl] Shortness Of Breath, Swelling   Hydrocodone Shortness Of Breath, Swelling, Rash   Morphine And Related Shortness Of Breath, Swelling, Rash   Oxycodone Shortness Of Breath, Swelling, Rash   Patient states ALL pain medications make her deathly sick.   Penicillins Shortness Of Breath, Swelling, Other (See Comments)   Has patient had a PCN reaction causing immediate rash, facial/tongue/throat swelling, SOB or lightheadedness with hypotension: Yes Has patient had a PCN reaction causing severe rash involving mucus membranes or skin necrosis: Yes Has patient had a PCN reaction that required hospitalization Yes Has patient had a PCN reaction occurring within the last 10 years: No If all of the above answers are "NO", then may proceed with Cephalosporin use. Throat swells 02/06/21--TOLERATES CEFTRIAXONE    Acetaminophen    Per MD patient states she can't take Tylenol because of liver enzymes   Ativan [lorazepam] Other (See Comments)  Migraines.   Humira [adalimumab]    rash   Strawberry Extract Swelling   Watermelon Concentrate Swelling   Albuterol Palpitations   Benadryl  [diphenhydramine Hcl] Palpitations   Latex Rash   Remicade [infliximab] Rash   Blisters and Welts        Medication List     STOP taking these medications    cefdinir 300 MG capsule Commonly known as: OMNICEF   omeprazole 40 MG capsule Commonly known as: PRILOSEC       TAKE these medications    acetaminophen 325 MG tablet Commonly known as: TYLENOL Take 2 tablets (650 mg total) by mouth every 6 (six) hours as needed for mild pain or headache (or Fever >/= 101).   albuterol 108 (90 Base) MCG/ACT inhaler Commonly known as: VENTOLIN HFA Inhale 2 puffs into the lungs every 4 (four) hours as needed for wheezing or shortness of breath.   ALPRAZolam 1 MG tablet Commonly known as: XANAX Take 1 mg by mouth 3 (three) times daily as needed for anxiety.   amitriptyline 75 MG tablet Commonly known as: ELAVIL Take 100 mg by mouth at bedtime.   apixaban 5 MG Tabs tablet Commonly known as: ELIQUIS Take 2 tablets by mouth twice a day for 4 days; then 1 tablet by mouth twice a day daily. Start taking on: January 06, 2022   cyclobenzaprine 10 MG tablet Commonly known as: FLEXERIL Take 10 mg by mouth 3 (three) times daily as needed for muscle spasms.   DULoxetine 30 MG capsule Commonly known as: CYMBALTA Take 30 mg by mouth daily.   feeding supplement Liqd Take 237 mLs by mouth 2 (two) times daily between meals. Start taking on: January 05, 5464   folic acid 1 MG tablet Commonly known as: FOLVITE Take 1 tablet (1 mg total) by mouth daily.   hydrOXYzine 25 MG tablet Commonly known as: ATARAX Take 25 mg by mouth 3 (three) times daily as needed for anxiety.   levETIRAcetam 750 MG tablet Commonly known as: KEPPRA Take 750 mg by mouth 2 (two) times daily.   nitroGLYCERIN 0.4 MG SL tablet Commonly known as: NITROSTAT Place 1 tablet (0.4 mg total) under the tongue every 5 (five) minutes as needed for chest pain.   ondansetron 4 MG disintegrating tablet Commonly known  as: ZOFRAN-ODT Take 4 mg by mouth every 8 (eight) hours as needed for nausea or vomiting.   pantoprazole 40 MG tablet Commonly known as: PROTONIX Take 1 tablet (40 mg total) by mouth 2 (two) times daily.   predniSONE 10 MG tablet Commonly known as: DELTASONE Prednisone 60 mg daily for 1 week, prednisone 50 mg daily for 1 week, prednisone 40 mg daily for 1 week and then decrease by 5 mg weekly. What changed:  how to take this additional instructions   Restasis 0.05 % ophthalmic emulsion Generic drug: cycloSPORINE Place 1 drop into both eyes 2 (two) times daily.   Spiriva Respimat 2.5 MCG/ACT Aers Generic drug: Tiotropium Bromide Monohydrate Inhale 1 puff into the lungs daily.   traMADol 50 MG tablet Commonly known as: ULTRAM Take 1 tablet (50 mg total) by mouth every 8 (eight) hours as needed for severe pain.   triamcinolone cream 0.1 % Commonly known as: KENALOG Apply topically 2 (two) times daily.   vancomycin 250 MG capsule Commonly known as: VANCOCIN vancomycin taper: 500 mg orally 4 times daily for 14 days, then 500 mg orally 2 times daily for 7 days, then 500 mg orally once  daily for 7 days, then 500 mg orally every other day for 4 weeks.   Vitamin D (Ergocalciferol) 1.25 MG (50000 UNIT) Caps capsule Commonly known as: DRISDOL Take 50,000 Units by mouth once a week.        Follow-up Information     Teodoro Kil, [place]. Schedule an appointment as soon as possible for a visit in 10 day(s).   Specialty: Physician Assistant Contact information: [address] [place] [address] [phone]         Satira Sark, MD .   Specialty: Cardiology Contact information: [place] [address] [phone]         Montez Morita, Quillian Quince, MD. Schedule an appointment as soon as possible for a visit in 4 day(s).   Specialty: Gastroenterology Why: office will contact you with appointment details. Contact  information: [address] [place] [address] [phone]                Discharge Exam: Danley Danker Weights   12/27/21 0500 12/29/21 0421 12/29/21 1805  Weight: 57.6 kg 59.3 kg 58.4 kg   General exam: Alert, awake, oriented x 3; reporting improvement in her abdominal pain and so far tolerating liquid diet.  Feeling ready to advance further.  Foley catheter, Flexi-Seal has been removed successfully. Respiratory system: Clear to auscultation. Respiratory effort normal.  No using accessory muscles. Cardiovascular system: No rubs, no gallops, no JVD. Gastrointestinal system: Abdomen is slightly distended, vaguely tender to deep palpation diffusely; positive bowel sounds.  No guarding. Central nervous system: Alert and oriented. No focal neurological deficits. Extremities: No cyanosis or clubbing. Skin: No petechiae. Psychiatry: Judgement and insight appear normal. Mood & affect appropriate.   Condition at discharge: Stable and improved.  The results of significant diagnostics from this hospitalization (including imaging, microbiology, ancillary and laboratory) are listed below for reference.   Imaging Studies: US Venous Img Upper Uni Right(DVT)  Result Date: 12/30/2021 CLINICAL DATA:  Right arm swelling for 3 days, indwelling PICC line EXAM: RIGHT UPPER EXTREMITY VENOUS DOPPLER ULTRASOUND TECHNIQUE: Gray-scale sonography with graded compression, as well as color Doppler and duplex ultrasound were performed to evaluate the upper extremity deep venous system from the level of the subclavian vein and including the jugular, axillary, basilic, radial, ulnar and upper cephalic vein. Spectral Doppler was utilized to evaluate flow at rest and with distal augmentation maneuvers. COMPARISON:  None Available. FINDINGS: Contralateral Subclavian Vein: Respiratory phasicity is normal and symmetric with the symptomatic side. No evidence of thrombus. Normal compressibility. Internal  Jugular Vein: No evidence of thrombus. Normal compressibility, respiratory phasicity and response to augmentation. Subclavian Vein: There is an indwelling PICC line. Echogenic nonocclusive thrombus is seen within the subclavian vein surrounding the PICC line. Minimal color flow is detected. Axillary Vein: No evidence of thrombus. Normal compressibility, respiratory phasicity and response to augmentation. Cephalic Vein: No evidence of thrombus. Normal compressibility, respiratory phasicity and response to augmentation. Basilic Vein: Indwelling PICC line is identified. There is echogenic occlusive thrombus within the basilic vein surrounding the PICC line. No Doppler flow detected. Brachial Veins: No evidence of thrombus. Normal compressibility, respiratory phasicity and response to augmentation. Radial Veins: No evidence of thrombus. Normal compressibility, respiratory phasicity and response to augmentation. Ulnar Veins: No evidence of thrombus. Normal compressibility, respiratory phasicity and response to augmentation. Other Findings:  None visualized. IMPRESSION: 1. Occlusive thrombus within the right basilic vein surrounding the indwelling PICC line. There is nonocclusive thrombus extending  into the subclavian vein surrounding the PICC line as well. These results will be called to the ordering clinician or representative by the Radiologist Assistant, and communication documented in the PACS or [employer]. Electronically Signed   By: Randa Ngo M.D.   On: 12/30/2021 10:53   CT ABDOMEN PELVIS W CONTRAST  Result Date: 12/28/2021 CLINICAL DATA:  History of C diff. Now progressive ileus and distention. Bowel obstruction suspected. Evaluate for megacolon. Abdominal pain. Abnormal abdominal x-ray. History of Crohn's disease. EXAM: CT ABDOMEN AND PELVIS WITH CONTRAST TECHNIQUE: Multidetector CT imaging of the abdomen and pelvis was performed using the standard protocol following bolus administration of  intravenous contrast. RADIATION DOSE REDUCTION: This exam was performed according to the departmental dose-optimization program which includes automated exposure control, adjustment of the mA and/or kV according to patient size and/or use of iterative reconstruction technique. CONTRAST:  133m OMNIPAQUE IOHEXOL 300 MG/ML  SOLN COMPARISON:  KUB 12/28/2021 and 12/23/2021; CT abdomen and pelvis 12/23/2021 FINDINGS: Lower chest: Heart size is again at the upper limits of normal. Mild to moderate pericardial effusion is unchanged. Hepatobiliary: Smooth liver contours. No focal liver mass is identified. Mildly decreased attenuation throughout the liver suggesting mild fatty infiltration. Cholecystectomy clips. No intrahepatic or extrahepatic biliary ductal dilatation. Pancreas: No mass or inflammatory fat stranding. No pancreatic ductal dilatation is seen. Spleen: Normal in size without focal abnormality. Adrenals/Urinary Tract: Adrenal glands are unremarkable. The kidneys enhance uniformly and are symmetric in size without hydronephrosis. No renal stone is seen. No renal mass is seen. The urinary bladder is decompressed by indwelling Foley catheter. Minimal associated air within the nondependent aspect of the urinary bladder. Stomach/Bowel: Rectal balloon catheter is again noted. The rectum and sigmoid colon are now decompressed with likely similar mild wall thickening again suggesting proctocolitis. Mild wall thickening of the proximal descending colon (axial series 2, image 41) appears new from prior. There is oral contrast seen as distal as the ileum. There are air-fluid levels again seen within the ascending, transverse, and proximal descending colon with fluid also seen within the more distal colon. No loss of haustra markings within the colon to specifically raise suspicion for toxic megacolon. No dilated loops of small bowel are seen. Vascular/Lymphatic: No abdominal aortic aneurysm. No mesenteric,  retroperitoneal, or pelvic lymphadenopathy. Reproductive: The uterus is surgically absent. No gross adnexal abnormality. Other: No abdominal wall hernia or abnormality.Moderate subcutaneous fat edema within the bilateral lower flanks and buttocks. No pneumoperitoneum. Mild free fluid within the pelvis is increased from prior. Musculoskeletal: Minimal levocurvature centered at L3. Mild anterior L2-3 endplate osteophytes. IMPRESSION: Compared to 12/23/2021: 1. Redemonstration of wall thickening within the rectum and sigmoid colon suggesting proctocolitis. New mild wall thickening within a portion of the proximal descending colon. Air-fluid levels throughout the colon as can be seen with diarrheal illness. 2. No dilated loops of small bowel to indicate bowel obstruction. 3. Mildly increased mild free fluid within the pelvis. 4. Increased moderate bilateral flank and buttock subcutaneous fat edema. Electronically Signed   By: RYvonne KendallM.D.   On: 12/28/2021 18:18   DG Abd Portable 2V  Result Date: 12/28/2021 CLINICAL DATA:  Abdominal pain EXAM: PORTABLE ABDOMEN - 2 VIEW COMPARISON:  December 23, 2021 FINDINGS: Air-filled prominent loops of large and small bowel are identified. A loop of colon in the right upper quadrant is dilated up to 8.2 cm. The amount of dilatation is increased since December 23, 2021. No free air, portal venous gas, or pneumatosis. No  other acute abnormalities. IMPRESSION: Air-filled dilated loops of large and small bowel most consistent with ileus. There is a loop of colon in the right upper quadrant measuring up to 8.2 cm. Recommend attention on follow-up. Electronically Signed   By: Dorise Bullion III M.D.   On: 12/28/2021 11:58   [place] EKG SITE RITE  Result Date: 12/24/2021 If Site Rite image not attached, placement could not be confirmed due to current cardiac rhythm.  CT HEAD WO CONTRAST (5MM)  Result Date: 12/23/2021 CLINICAL DATA:  Mental status change, unknown cause Falls, AMS,  hypotension. EXAM: CT HEAD WITHOUT CONTRAST TECHNIQUE: Contiguous axial images were obtained from the base of the skull through the vertex without intravenous contrast. RADIATION DOSE REDUCTION: This exam was performed according to the departmental dose-optimization program which includes automated exposure control, adjustment of the mA and/or kV according to patient size and/or use of iterative reconstruction technique. COMPARISON:  None Available. FINDINGS: Brain: Normal anatomic configuration. No abnormal intra or extra-axial mass lesion or fluid collection. No abnormal mass effect or midline shift. No evidence of acute intracranial hemorrhage or infarct. Ventricular size is normal. Cerebellum unremarkable. Vascular: Unremarkable Skull: Intact Sinuses/Orbits: Paranasal sinuses are clear. Orbits are unremarkable. Other: Mastoid air cells and middle ear cavities are clear. IMPRESSION: No acute intracranial abnormality. Electronically Signed   By: Fidela Salisbury M.D.   On: 12/23/2021 21:58   CT ABDOMEN PELVIS W CONTRAST  Result Date: 12/23/2021 CLINICAL DATA:  Abdominal pain and bloating for 1 week. Coffee-ground emesis. EXAM: CT ABDOMEN AND PELVIS WITH CONTRAST TECHNIQUE: Multidetector CT imaging of the abdomen and pelvis was performed using the standard protocol following bolus administration of intravenous contrast. RADIATION DOSE REDUCTION: This exam was performed according to the departmental dose-optimization program which includes automated exposure control, adjustment of the mA and/or kV according to patient size and/or use of iterative reconstruction technique. CONTRAST:  41m OMNIPAQUE IOHEXOL 300 MG/ML  SOLN COMPARISON:  10/24/2021 FINDINGS: Lower Chest: Stable small pericardial effusion. Hepatobiliary: No hepatic masses identified. Prior cholecystectomy. No evidence of biliary obstruction. Pancreas:  No mass or inflammatory changes. Spleen: Within normal limits in size and appearance.  Adrenals/Urinary Tract: No masses identified. No evidence of ureteral calculi or hydronephrosis. Foley catheter is seen within the bladder. Stomach/Bowel: Air-fluid levels are seen throughout the colon which is mildly distended, consistent with ileus. Mild wall thickening is again seen involving the rectum and distal sigmoid colon with mild perirectal inflammatory changes, consistent with proctocolitis. No evidence of abscess or bowel obstruction. Vascular/Lymphatic: No pathologically enlarged lymph nodes. No acute vascular findings. Right femoral central venous catheter in appropriate position. Reproductive: Prior hysterectomy noted. Adnexal regions are unremarkable in appearance. Other: Soft tissue gas noted in the right inguinal region and abductor muscles of the thigh, likely related to the right femoral venous catheter placement. Musculoskeletal:  No suspicious bone lesions identified. IMPRESSION: Mild proctocolitis involving the rectosigmoid, without significant change. No evidence of abscess or other complication. Mild colonic ileus. Stable small pericardial effusion. Electronically Signed   By: JMarlaine HindM.D.   On: 12/23/2021 19:08   DG Chest Port 1 View  Result Date: 12/23/2021 CLINICAL DATA:  Found unresponsive. EXAM: PORTABLE CHEST 1 VIEW COMPARISON:  March 20, 2021 FINDINGS: The heart size and mediastinal contours are within normal limits. Both lungs are clear. The visualized skeletal structures are unremarkable. IMPRESSION: No active disease. Electronically Signed   By: TVirgina NorfolkM.D.   On: 12/23/2021 17:18   DG Abdomen 1 View  Result Date: 12/23/2021 CLINICAL DATA:  Central line placement EXAM: ABDOMEN - 1 VIEW COMPARISON:  None Available. FINDINGS: Right femoral central line is in place with the tip in the expected location of the common iliac vein. Bowel gas pattern is unremarkable. IMPRESSION: Central line on the right at the expected location of the common iliac vein.  Electronically Signed   By: Nelson Chimes M.D.   On: 12/23/2021 15:36    Microbiology: Results for orders placed or performed during the hospital encounter of 12/23/21  Gastrointestinal Panel by PCR , Stool     Status: Abnormal   Collection Time: 12/23/21 12:37 AM   Specimen: Stool  Result Value Ref Range Status   Campylobacter species NOT DETECTED NOT DETECTED Final   Plesimonas shigelloides NOT DETECTED NOT DETECTED Final   Salmonella species DETECTED (A) NOT DETECTED Final    Comment: RESULT CALLED TO, READ BACK BY AND VERIFIED WITH: C/CODY [facility] 12/24/21 1927    Yersinia enterocolitica NOT DETECTED NOT DETECTED Final   Vibrio species NOT DETECTED NOT DETECTED Final   Vibrio cholerae NOT DETECTED NOT DETECTED Final   Enteroaggregative E coli (EAEC) NOT DETECTED NOT DETECTED Final   Enteropathogenic E coli (EPEC) NOT DETECTED NOT DETECTED Final   Enterotoxigenic E coli (ETEC) NOT DETECTED NOT DETECTED Final   Shiga like toxin producing E coli (STEC) NOT DETECTED NOT DETECTED Final   Shigella/Enteroinvasive E coli (EIEC) NOT DETECTED NOT DETECTED Final   Cryptosporidium NOT DETECTED NOT DETECTED Final   Cyclospora cayetanensis NOT DETECTED NOT DETECTED Final   Entamoeba histolytica NOT DETECTED NOT DETECTED Final   Giardia lamblia NOT DETECTED NOT DETECTED Final   Adenovirus F40/41 NOT DETECTED NOT DETECTED Final   Astrovirus NOT DETECTED NOT DETECTED Final   Norovirus GI/GII NOT DETECTED NOT DETECTED Final   Rotavirus A NOT DETECTED NOT DETECTED Final   Sapovirus (I, II, IV, and V) NOT DETECTED NOT DETECTED Final    Comment: Performed at [facility], [address] [place], [place] [address]  Blood Culture (routine x 2)     Status: None   Collection Time: 12/23/21  3:27 PM   Specimen: Leg; Blood  Result Value Ref Range Status   Specimen Description LEG  Final   Special Requests   Final    BOTTLES DRAWN AEROBIC AND ANAEROBIC Blood Culture results may not be optimal  due to an excessive volume of blood received in culture bottles   Culture   Final    NO GROWTH 5 DAYS Performed at [facility], [address], [place], [place] [address]    Report Status 12/28/2021 FINAL  Final  Resp Panel by RT-PCR (Flu A&B, Covid) Anterior Nasal Swab     Status: None   Collection Time: 12/23/21  3:45 PM   Specimen: Anterior Nasal Swab  Result Value Ref Range Status   SARS Coronavirus 2 by RT PCR NEGATIVE NEGATIVE Final    Comment: (NOTE) SARS-CoV-2 target nucleic acids are NOT DETECTED.  The SARS-CoV-2 RNA is generally detectable in upper respiratory specimens during the acute phase of infection. The lowest concentration of SARS-CoV-2 viral copies this assay can detect is 138 copies/mL. A negative result does not preclude SARS-Cov-2 infection and should not be used as the sole basis for treatment or other patient management decisions. A negative result may occur with  improper specimen collection/handling, submission of specimen other than nasopharyngeal swab, presence of viral mutation(s) within the areas targeted by this assay, and inadequate number of  viral copies(<138 copies/mL). A negative result must be combined with clinical observations, patient history, and epidemiological information. The expected result is Negative.  Fact Sheet for Patients:  EntrepreneurPulse.com.au  Fact Sheet for Healthcare Providers:  IncredibleEmployment.be  This test is no t yet approved or cleared by the [place] FDA and  has been authorized for detection and/or diagnosis of SARS-CoV-2 by FDA under an Emergency Use Authorization (EUA). This EUA will remain  in effect (meaning this test can be used) for the duration of the COVID-19 declaration under Section 564(b)(1) of the Act, 21 U.S.C.section 360bbb-3(b)(1), unless the authorization is terminated  or revoked sooner.       Influenza A by PCR NEGATIVE NEGATIVE Final   Influenza B  by PCR NEGATIVE NEGATIVE Final    Comment: (NOTE) The Xpert Xpress SARS-CoV-2/FLU/RSV plus assay is intended as an aid in the diagnosis of influenza from Nasopharyngeal swab specimens and should not be used as a sole basis for treatment. Nasal washings and aspirates are unacceptable for Xpert Xpress SARS-CoV-2/FLU/RSV testing.  Fact Sheet for Patients: EntrepreneurPulse.com.au  Fact Sheet for Healthcare Providers: IncredibleEmployment.be  This test is not yet approved or cleared by the [place] FDA and has been authorized for detection and/or diagnosis of SARS-CoV-2 by FDA under an Emergency Use Authorization (EUA). This EUA will remain in effect (meaning this test can be used) for the duration of the COVID-19 declaration under Section 564(b)(1) of the Act, 21 U.S.C. section 360bbb-3(b)(1), unless the authorization is terminated or revoked.  Performed at [facility], [address], [place], [place] [address]   Urine Culture     Status: Abnormal   Collection Time: 12/23/21  4:41 PM   Specimen: In/Out Cath Urine  Result Value Ref Range Status   Specimen Description   Final    IN/OUT CATH URINE Performed at [facility], [address], [place], [place] [address]    Special Requests   Final    NONE Performed at [facility], [address], [place], [place] [address]    Culture (A)  Final    >=100,000 COLONIES/mL ESCHERICHIA COLI Susceptibility Pattern Suggests Possibility of an Extended Spectrum Beta Lactamase Producer. Contact Laboratory Within 7 Days if Confirmation Warranted. Performed at [facility], [place] [address], [place], [place] [address]    Report Status 12/26/2021 FINAL  Final   Organism ID, Bacteria ESCHERICHIA COLI (A)  Final      Susceptibility   Escherichia coli - MIC*    AMPICILLIN >=32 RESISTANT Resistant     CEFAZOLIN >=64 RESISTANT Resistant     CEFEPIME 16 RESISTANT Resistant     CEFTRIAXONE >=64 RESISTANT  Resistant     CIPROFLOXACIN >=4 RESISTANT Resistant     GENTAMICIN <=1 SENSITIVE Sensitive     IMIPENEM <=0.25 SENSITIVE Sensitive     NITROFURANTOIN <=16 SENSITIVE Sensitive     TRIMETH/SULFA <=20 SENSITIVE Sensitive     AMPICILLIN/SULBACTAM >=32 RESISTANT Resistant     PIP/TAZO 16 SENSITIVE Sensitive     * >=100,000 COLONIES/mL ESCHERICHIA COLI  MRSA Next Gen by PCR, Nasal     Status: None   Collection Time: 12/23/21 11:01 PM   Specimen: Nasal Mucosa; Nasal Swab  Result Value Ref Range Status   MRSA by PCR Next Gen NOT DETECTED NOT DETECTED Final    Comment: (NOTE) The GeneXpert MRSA Assay (FDA approved for NASAL specimens only), is one component of a comprehensive MRSA colonization surveillance program. It is not intended to diagnose MRSA  infection nor to guide or monitor treatment for MRSA infections. Test performance is not FDA approved in patients less than 68 years old. Performed at [facility], [address] [place], [place] [address]   C Difficile Quick Screen w PCR reflex     Status: Abnormal   Collection Time: 12/24/21 12:24 AM   Specimen: Stool  Result Value Ref Range Status   C Diff antigen POSITIVE (A) NEGATIVE Final   C Diff toxin NEGATIVE NEGATIVE Final   C Diff interpretation Results are indeterminate. See PCR results.  Final    Comment: Performed at [facility], [address] [place], [place] [address]  C. Diff by PCR, Reflexed     Status: Abnormal   Collection Time: 12/24/21 12:24 AM  Result Value Ref Range Status   Toxigenic C. Difficile by PCR POSITIVE (A) NEGATIVE Final    Comment: Positive for toxigenic C. difficile with little to no toxin production. Only treat if clinical presentation suggests symptomatic illness. Performed at [facility], [place] [address] [place],  [address]     Labs: CBC: Recent Labs  Lab 12/28/21 0359 12/29/21 0800 12/30/21 0342 01/02/22 0515  WBC 9.0 10.6* 8.0 9.4  HGB 9.5* 10.9* 10.1* 10.4*  HCT 29.1*  32.8* 31.0* 31.6*  MCV 96.4 95.3 96.0 96.3  PLT 174 231 220 630*   Basic Metabolic Panel: Recent Labs  Lab 12/28/21 0359 12/29/21 0345 12/30/21 0342 01/02/22 0515 01/03/22 0507  NA 140 138 138 136 137  K 3.3* 3.8 4.0 5.7* 3.0*  CL 109 108 108 106 106  CO2 27 25 25 26 26   GLUCOSE 78 64* 65* 72 87  BUN 6 5* <5* <5* <5*  CREATININE 0.44 0.43* 0.54 0.69 0.62  CALCIUM 8.1* 8.2* 8.1* 8.3* 8.2*  MG 1.5* 1.6* 1.9  --   --    Liver Function Tests: No results for input(s): "AST", "ALT", "ALKPHOS", "BILITOT", "PROT", "ALBUMIN" in the last 168 hours. CBG: Recent Labs  Lab 12/30/21 0530 12/31/21 0500 01/01/22 0506 01/01/22 0551 01/02/22 2104  GLUCAP 65* 76 62* 73 111*    Discharge time spent: greater than 30 minutes.  Signed: [name], MD [facility] 01/03/2022

## 2022-01-05 LAB — GLUCOSE, CAPILLARY: Glucose-Capillary: 20 mg/dL — CL (ref 70–99)

## 2022-01-11 ENCOUNTER — Telehealth (INDEPENDENT_AMBULATORY_CARE_PROVIDER_SITE_OTHER): Payer: Self-pay

## 2022-01-11 NOTE — Telephone Encounter (Signed)
Patient called today stating she is having some issues with rectal bleeding. She at first thought it may be related to hemorrhoids, but bleeding is getting worse. She is not having any diarrhea. I made patient aware she has an appointment tomorrow to be here at the office 01/12/2022 at 10:15 am to be checked out, I advised if any worsening of symptoms to report to the Ed. Patient states understanding. Patient is not currently taking any biologic for her Crohn's.

## 2022-01-12 ENCOUNTER — Ambulatory Visit (INDEPENDENT_AMBULATORY_CARE_PROVIDER_SITE_OTHER): Payer: 59 | Admitting: Gastroenterology

## 2022-01-12 VITALS — BP 90/55 | HR 120 | Temp 97.8°F | Ht 64.0 in | Wt 126.0 lb

## 2022-01-12 DIAGNOSIS — A0472 Enterocolitis due to Clostridium difficile, not specified as recurrent: Secondary | ICD-10-CM | POA: Diagnosis not present

## 2022-01-12 DIAGNOSIS — K50919 Crohn's disease, unspecified, with unspecified complications: Secondary | ICD-10-CM | POA: Diagnosis not present

## 2022-01-12 DIAGNOSIS — K625 Hemorrhage of anus and rectum: Secondary | ICD-10-CM

## 2022-01-12 HISTORY — DX: Hemorrhage of anus and rectum: K62.5

## 2022-01-12 NOTE — Patient Instructions (Signed)
Please finish course of steroids and vancomycin Let's check blood counts today to make sure hemoglobin is stable As I did not see any active bleeding on rectal exam, we will keep an eye on this, if blood counts are significantly low, you have worsening shortness of breath, dizziness or pass out, these are things we would need you to proceed to the ED for. Please keep upcoming appt with Dr. Nyoka Cowden on 9/21, this is very important  Follow up 3 months

## 2022-01-12 NOTE — Progress Notes (Signed)
Referring Provider: Teodoro Kil, PA-C Primary Care Physician:  Teodoro Kil, PA-C Primary GI Physician: Jenetta Downer   Chief Complaint  Patient presents with   Rectal Bleeding    Follow up. Having concerns about rectal bleeding. Started 2 days ago. Had some abdominal pain in lower abdomen last night.    HPI:   [name] is a 51 y.o. female with past medical history of  anxiety, asthma, COPD, GERD, hypertension, seizures, tobacco abuse, PTSD, type 2 diabetes, colonic stricturing Crohn's disease (diagnosed at age 64  Patient presenting today for hospital follow up.  Recent hospitalization on 8/24, patient arrived to ED after being found unrepsonsive with a large pile of blood tinged stool nearby.  Low BP on arrival. Having stool incontinence and blood streaks in stool on arrival.   On presentation white blood cell count 24,200, hemoglobin 11.1, platelets 425,000, BUN 32, creatinine 1.54, albumin 1.9, total bilirubin 0.6, alkaline phosphatase 150, AST 21, ALT 12.  Lactic acid 5.9-->2.8.   CT abdomen pelvis with contrast yesterday: Mild proctocolitis involving the rectosigmoid, without significant change.  Mild colonic ileus.   C diff testing was positive, started on oral vancomycin, IV flagyl and IV solumedrol.  Stay was further complicated by presence of DVT in RUA at site of PICC line which required initiaton of ACs  She has upcoming appts Dr. Carlos Levering on 01/18/22 at [place] next month, new patient appointment. Current follow up with Dr. Eduard Roux at [facility] is on 01/21/22. Patient encouraged to establish care with one Medical center so that she has continuity of her IBD management. Discharged with oral vanc taper and prednisone taper on 9/3. Further discussion of possible FMT to be done on outpatient basis.   Most recent labs with hgb 10.4, wbc 9.4, crp <0.5 on 01/02/22  Present:  Patient reports that she started noticing BRBPR on Sunday, she states that bleeding  has become heavier. Even when she sits down to pee she will have some blood coming from her rectum. She notes only some spotting when she is not on the toilet. She states that bleeding is heavier now like a menstrual period anytime she sits on the toilet. Diarrhea has slowed down at this point, though she reports up to 6 BMs per day. Stools are still watery at this time. She is still having some lower abdominal pain. Denies fevers or chills. She notes some larger blood clots mixed in at times as well. She notes some SOB and dizziness as well.  Pertinent History: Crohn's that was untreated for multiple years prior to starting Remicade.  Partially responsive to Remicade but developed allergic reaction with pruritis/erythematous rash. She developed similar rash on Humira and took only two doses. Plans to avoid anti-TNF. Encouraged to try Stelara/Skyrizi or surgical approach for ileostomy due to her history of stricturing disease but she refused for surgery in the past.  seen by Dr. Nyoka Cowden at [facility].  Plans for patient to see dermatology, ophthalmology, multidisciplinary IBD clinic with both GI and surgery.   hospitalized this year at multiple facilities ([facility], [facility]).   Underwent flexible sigmoidoscopy with GI December 11, 2021 at [facility].  Noted to have rectal stricture with inner diameter of 6 mm (exam accomplished with nasopharyngeal scope), circumferential ulceration present in the distal rectum and patchy ulcerations throughout the mid and proximal rectum.  Inflammation found in the rectosigmoid colon, mild in severity. Pathology  with inactive chronic inflammatory disease of sigmoid colon, rectum with active chronic inflammatory disease and ulceration.  Started on prednisone taper at discharge.   Admitted to [facility] June 2023 with acute pancreatitis and colitis.  Tested positive for C. difficile,  provided oral vancomycin.   Hospitalized [facility] July 19 through July 28.  C. difficile EIA - July 19.   Most recent attempted colonoscopy March 2023 was incomplete due to poor prep, revealed inflammation in the distal sigmoid colon and rectum which was severe but records of large amount of stool in the colon precluded visualization.  Presence of noncritical rectal stricture noted on DRE.  Pathology consistent with active colitis with ulceration and necroinflammatory debris.   November 18, 2021: C. difficile EIA negative.  GI pathogen panel negative.    December 11, 2021: C. difficile toxin EIA negative, PCR+ felt to be consistent with colonization. Giardia antigen negative, cryptosporidium antigen negative, O&P negative, stool culture negative x2.   CT abdomen and pelvis with contrast December 09, 2021: Significant wall thickening and adjacent soft tissue stranding of the rectum which extends to the rectosigmoid junction.  Presacral soft tissue stranding. Normal-appearing terminal ileum   Past Medical History:  Diagnosis Date   Anxiety    Asthma    Chronic low back pain    Collagen vascular disease (HCC)    COPD (chronic obstructive pulmonary disease) (HCC)    Crohn's disease (HCC)    GERD (gastroesophageal reflux disease)    EGD 01/2007 by Dr.Rourke small hiatal hernia s/p 56 french maloney    History of head injury    Hypertension    IBS (irritable bowel syndrome)    PSVT (paroxysmal supraventricular tachycardia) (HCC)    PTSD (post-traumatic stress disorder)    Recurrent chest pain    Seizure disorder ([place])    Type 2 diabetes mellitus ([place])     Past Surgical History:  Procedure Laterality Date   ABDOMINAL HYSTERECTOMY     with right salpingo oophorectomy 2005   APPENDECTOMY  2006   BALLOON DILATION N/A 02/19/2021   Procedure: BALLOON DILATION;  Surgeon: Eloise Harman, DO;  [facility];  Service: Endoscopy;  Laterality: N/A;  Sigmoid colon  stricture   BIOPSY  02/06/2021   Procedure: BIOPSY;  Surgeon: Eloise Harman, DO;  [facility];  Service: Endoscopy;;   BIOPSY  02/19/2021   Procedure: BIOPSY;  Surgeon: Eloise Harman, DO;  [facility];  Service: Endoscopy;;   BIOPSY  07/15/2021   Procedure: BIOPSY;  Surgeon: Rogene Houston, MD;  [facility];  Service: Endoscopy;;   CESAREAN SECTION     1996   CHOLECYSTECTOMY     COLONOSCOPY  2010   Dr. Gala Romney; negative except for hemorrhoids   ESOPHAGEAL DILATION N/A 10/25/2014   Procedure: ESOPHAGEAL DILATION;  Surgeon: Rogene Houston, MD;  [facility];  Service: Endoscopy;  Laterality: N/A;   ESOPHAGOGASTRODUODENOSCOPY N/A 10/25/2014   Procedure: ESOPHAGOGASTRODUODENOSCOPY (EGD);  Surgeon: Rogene Houston, MD;  [facility];  Service: Endoscopy;  Laterality: N/A;  [place] N/A 02/06/2021   Procedure: FLEXIBLE SIGMOIDOSCOPY;  Surgeon: Eloise Harman, DO;  [facility];  Service: Endoscopy;  Laterality: N/A;   FLEXIBLE SIGMOIDOSCOPY N/A 02/19/2021   Procedure: FLEXIBLE SIGMOIDOSCOPY;  Surgeon: Eloise Harman, DO;  [facility];  Service: Endoscopy;  Laterality: N/A;   OOPHORECTOMY  left for torsion and ovarian fibroma; uterine myoma resected 1995   SIGMOIDOSCOPY  07/15/2021   Procedure: SIGMOIDOSCOPY;  Surgeon: Rogene Houston, MD;  [facility];  Service: Endoscopy;;    Current Outpatient Medications  Medication Sig Dispense Refill   acetaminophen (TYLENOL) 325 MG tablet Take 2 tablets (650 mg total) by mouth every 6 (six) hours as needed for mild pain or headache (or Fever >/= 101).     albuterol (VENTOLIN HFA) 108 (90 Base) MCG/ACT inhaler Inhale 2 puffs into the lungs every 4 (four) hours as needed for wheezing or shortness of breath. 18 g 1   ALPRAZolam (XANAX) 1 MG tablet Take 1 mg by mouth 3 (three) times daily as needed for anxiety.     amitriptyline  (ELAVIL) 75 MG tablet Take 75 mg by mouth at bedtime.     apixaban (ELIQUIS) 5 MG TABS tablet Take 2 tablets by mouth twice a day for 4 days; then 1 tablet by mouth twice a day daily. 60 tablet 3   cyclobenzaprine (FLEXERIL) 10 MG tablet Take 10 mg by mouth 3 (three) times daily as needed for muscle spasms.     DULoxetine (CYMBALTA) 30 MG capsule Take 30 mg by mouth daily.     feeding supplement (ENSURE ENLIVE / ENSURE PLUS) LIQD Take 237 mLs by mouth 2 (two) times daily between meals.     folic acid (FOLVITE) 1 MG tablet Take 1 tablet (1 mg total) by mouth daily.     hydrOXYzine (ATARAX) 25 MG tablet Take 25 mg by mouth 3 (three) times daily as needed for anxiety.     levETIRAcetam (KEPPRA) 750 MG tablet Take 750 mg by mouth 2 (two) times daily.     nitroGLYCERIN (NITROSTAT) 0.4 MG SL tablet Place 1 tablet (0.4 mg total) under the tongue every 5 (five) minutes as needed for chest pain. 100 tablet 0   ondansetron (ZOFRAN-ODT) 4 MG disintegrating tablet Take 4 mg by mouth every 8 (eight) hours as needed for nausea or vomiting.     pantoprazole (PROTONIX) 40 MG tablet Take 1 tablet (40 mg total) by mouth 2 (two) times daily. 60 tablet 2   predniSONE (DELTASONE) 10 MG tablet Prednisone 60 mg daily for 1 week, prednisone 50 mg daily for 1 week, prednisone 40 mg daily for 1 week and then decrease by 5 mg weekly. 200 tablet 0   RESTASIS 0.05 % ophthalmic emulsion Place 1 drop into both eyes 2 (two) times daily.     SPIRIVA RESPIMAT 2.5 MCG/ACT AERS Inhale 1 puff into the lungs daily. 4 g 3   triamcinolone cream (KENALOG) 0.1 % Apply topically 2 (two) times daily. 30 g 1   vancomycin (VANCOCIN) 250 MG capsule vancomycin taper: 500 mg orally 4 times daily for 14 days, then 500 mg orally 2 times daily for 7 days, then 500 mg orally once daily for 7 days, then 500 mg orally every other day for 4 weeks. 169 capsule 0   Vitamin D, Ergocalciferol, (DRISDOL) 1.25 MG (50000 UNIT) CAPS capsule Take 50,000 Units by  mouth once a week.     No current facility-administered medications for this visit.    Allergies as of 01/12/2022 - Review Complete 01/12/2022  Allergen Reaction Noted   Codeine Shortness Of Breath, Swelling, and Rash    Dilaudid [hydromorphone hcl] Shortness Of Breath and Swelling 12/26/2012   Hydrocodone Shortness Of Breath, Swelling, and Rash 12/26/2012   Morphine and related Shortness Of Breath, Swelling,  and Rash 12/26/2012   Oxycodone Shortness Of Breath, Swelling, and Rash 12/26/2012   Penicillins Shortness Of Breath, Swelling, and Other (See Comments)    Acetaminophen  12/26/2012   Ativan [lorazepam] Other (See Comments) 12/20/2016   Humira [adalimumab]  12/24/2021   Strawberry extract Swelling 07/22/2011   Watermelon concentrate Swelling 07/22/2011   Albuterol Palpitations 07/08/2021   Benadryl [diphenhydramine hcl] Palpitations 09/23/2011   Latex Rash    Remicade [infliximab] Rash 07/08/2021   Tramadol Rash 01/12/2022    Family History  Problem Relation Age of Onset   Cancer Mother    Heart failure Mother    Hypertension Mother    Heart attack Mother    Heart attack Father 70   Cancer Father 27   Heart failure Father    Diabetes Father    Crohn's disease Cousin     Social History   Socioeconomic History   Marital status: Divorced    Spouse name: Not on file   Number of children: Not on file   Years of education: Not on file   Highest education level: Not on file  Occupational History   Not on file  Tobacco Use   Smoking status: Some Days    Packs/day: 0.50    Years: 20.00    Total pack years: 10.00    Types: Cigarettes    Start date: 09/26/1984    Passive exposure: Current   Smokeless tobacco: Never  Vaping Use   Vaping Use: Never used  Substance and Sexual Activity   Alcohol use: No    Alcohol/week: 0.0 standard drinks of alcohol   Drug use: Not Currently   Sexual activity: Yes    Birth control/protection: Surgical  Other Topics Concern    Not on file  Social History Narrative   Not on file   Social Determinants of Health   Financial Resource Strain: Not on file  Food Insecurity: Not on file  Transportation Needs: Not on file  Physical Activity: Not on file  Stress: Not on file  Social Connections: Not on file   Review of systems General: negative for malaise, night sweats, fever, chills, weight loss Neck: Negative for lumps, goiter, pain and significant neck swelling Resp: Negative for cough, wheezing, dyspnea at rest CV: Negative for chest pain, leg swelling, palpitations, orthopnea GI: denies melena,  nausea, vomiting, constipation, dysphagia, odyonophagia, early satiety or unintentional weight loss. +diarrhea +rectal bleeding  MSK: Negative for joint pain or swelling, back pain, and muscle pain. Derm: Negative for itching or rash Psych: Denies depression, anxiety, memory loss, confusion. No homicidal or suicidal ideation.  Heme: Negative for prolonged bleeding, bruising easily, and swollen nodes. Endocrine: Negative for cold or heat intolerance, polyuria, polydipsia and goiter. Neuro: negative for tremor, gait imbalance, syncope and seizures. The remainder of the review of systems is noncontributory.  Physical Exam: BP (!) 90/55 (BP Location: Left Arm, Patient Position: Sitting, Cuff Size: Normal)   Pulse (!) 120   Temp 97.8 F (36.6 C) (Oral)   Ht 5' 4"  (1.626 m)   Wt 126 lb (57.2 kg)   BMI 21.63 kg/m  General:   Alert and oriented. No distress noted. Pleasant and cooperative.  Head:  Normocephalic and atraumatic. Eyes:  Conjuctiva clear without scleral icterus. Mouth:  Oral mucosa pink and moist. Good dentition. No lesions. Heart: Normal rate and rhythm, s1 and s2 heart sounds present.  Lungs: Clear lung sounds in all lobes. Respirations equal and unlabored. Abdomen:  +BS, soft, and non-distended. Mild  TTP of mid to lower abdomen.  No rebound or guarding. No HSM or masses noted. Rectal:  crystal sutton,  CMA present as witness during rectal exam, some looser stool noted but no obvious BRB or melena, no lesions, patient tender upon exam, sphincter tone WNL Derm: No palmar erythema or jaundice Msk:  Symmetrical without gross deformities. Normal posture. Extremities:  Without edema. Neurologic:  Alert and  oriented x4 Psych:  Alert and cooperative. Normal mood and affect.  Invalid input(s): "6 MONTHS"   ASSESSMENT: [name] is a 51 y.o. female presenting today for hospital follow up.  Patient with significant history of uncontrolled Crohn's disease who has failed multiple treatments with recurrent C diff, most recent C diff infection at the end of August, she is finishing up vancomycin and prednisone taper now. She notes ongoing diarrhea, maybe 6 episodes per day which is close to her most recent baseline, feels this has slowed significantly since admission. Suspect at this time diarrhea is secondary to her uncontrolled Crohn's. She also reports BRBPR, stating she has blood "pouring out" of her rectum each time she sits on the toilet. Rectal exam done without any obvious or overt rectal bleeding today. Recommend updating CBC to monitor for hemoglobin stability. If she has worsening bleeding, lightheadedness, sob or syncopal episode, she should proceed to the ED for further evaluation. Ultimately she needs to keep appt with Dr. Nyoka Cowden at [facility] on 9/21 for further evaluation/discussion of surgical management of her IBD.    PLAN:  CBC 2. ED precautions  3. Keep appt with Dr. Nyoka Cowden 4. Finish vanc and prednisone   All questions were answered, patient verbalized understanding and is in agreement with plan as outlined above.    Follow Up: 3 months   Ginia Rudell L. Alver Sorrow, MSN, APRN, AGNP-C Adult-Gerontology Nurse Practitioner [facility]

## 2022-01-13 DIAGNOSIS — K625 Hemorrhage of anus and rectum: Secondary | ICD-10-CM | POA: Diagnosis not present

## 2022-01-14 ENCOUNTER — Encounter (INDEPENDENT_AMBULATORY_CARE_PROVIDER_SITE_OTHER): Payer: Self-pay | Admitting: Gastroenterology

## 2022-01-14 LAB — CBC
Hematocrit: 30.4 % — ABNORMAL LOW (ref 34.0–46.6)
Hemoglobin: 10.1 g/dL — ABNORMAL LOW (ref 11.1–15.9)
MCH: 31.9 pg (ref 26.6–33.0)
MCHC: 33.2 g/dL (ref 31.5–35.7)
MCV: 96 fL (ref 79–97)
Platelets: 389 10*3/uL (ref 150–450)
RBC: 3.17 x10E6/uL — ABNORMAL LOW (ref 3.77–5.28)
RDW: 13.4 % (ref 11.7–15.4)
WBC: 7.4 10*3/uL (ref 3.4–10.8)

## 2022-01-15 ENCOUNTER — Encounter (HOSPITAL_COMMUNITY): Payer: Self-pay

## 2022-01-15 ENCOUNTER — Emergency Department (HOSPITAL_COMMUNITY): Payer: 59

## 2022-01-15 ENCOUNTER — Inpatient Hospital Stay (HOSPITAL_COMMUNITY)
Admission: EM | Admit: 2022-01-15 | Discharge: 2022-01-18 | DRG: 385 | Disposition: A | Discharge status: Home / Self Care | Payer: 59 | Attending: Family Medicine | Admitting: Family Medicine

## 2022-01-15 ENCOUNTER — Other Ambulatory Visit: Payer: Self-pay

## 2022-01-15 DIAGNOSIS — K50111 Crohn's disease of large intestine with rectal bleeding: Principal | ICD-10-CM | POA: Diagnosis present

## 2022-01-15 DIAGNOSIS — Z91040 Latex allergy status: Secondary | ICD-10-CM

## 2022-01-15 DIAGNOSIS — Z7901 Long term (current) use of anticoagulants: Secondary | ICD-10-CM

## 2022-01-15 DIAGNOSIS — K5090 Crohn's disease, unspecified, without complications: Secondary | ICD-10-CM | POA: Diagnosis not present

## 2022-01-15 DIAGNOSIS — G9341 Metabolic encephalopathy: Secondary | ICD-10-CM | POA: Diagnosis present

## 2022-01-15 DIAGNOSIS — Z885 Allergy status to narcotic agent status: Secondary | ICD-10-CM

## 2022-01-15 DIAGNOSIS — Z888 Allergy status to other drugs, medicaments and biological substances status: Secondary | ICD-10-CM

## 2022-01-15 DIAGNOSIS — E119 Type 2 diabetes mellitus without complications: Secondary | ICD-10-CM | POA: Diagnosis present

## 2022-01-15 DIAGNOSIS — K6389 Other specified diseases of intestine: Secondary | ICD-10-CM | POA: Diagnosis not present

## 2022-01-15 DIAGNOSIS — F419 Anxiety disorder, unspecified: Secondary | ICD-10-CM | POA: Diagnosis present

## 2022-01-15 DIAGNOSIS — Z8249 Family history of ischemic heart disease and other diseases of the circulatory system: Secondary | ICD-10-CM

## 2022-01-15 DIAGNOSIS — I959 Hypotension, unspecified: Secondary | ICD-10-CM | POA: Diagnosis not present

## 2022-01-15 DIAGNOSIS — G8929 Other chronic pain: Secondary | ICD-10-CM | POA: Diagnosis present

## 2022-01-15 DIAGNOSIS — Z886 Allergy status to analgesic agent status: Secondary | ICD-10-CM

## 2022-01-15 DIAGNOSIS — R58 Hemorrhage, not elsewhere classified: Secondary | ICD-10-CM | POA: Diagnosis not present

## 2022-01-15 DIAGNOSIS — R1084 Generalized abdominal pain: Secondary | ICD-10-CM | POA: Diagnosis not present

## 2022-01-15 DIAGNOSIS — Z79899 Other long term (current) drug therapy: Secondary | ICD-10-CM

## 2022-01-15 DIAGNOSIS — Z9102 Food additives allergy status: Secondary | ICD-10-CM

## 2022-01-15 DIAGNOSIS — K922 Gastrointestinal hemorrhage, unspecified: Secondary | ICD-10-CM | POA: Diagnosis not present

## 2022-01-15 DIAGNOSIS — Z833 Family history of diabetes mellitus: Secondary | ICD-10-CM

## 2022-01-15 DIAGNOSIS — K529 Noninfective gastroenteritis and colitis, unspecified: Secondary | ICD-10-CM | POA: Diagnosis not present

## 2022-01-15 DIAGNOSIS — I82621 Acute embolism and thrombosis of deep veins of right upper extremity: Secondary | ICD-10-CM | POA: Diagnosis present

## 2022-01-15 DIAGNOSIS — A0472 Enterocolitis due to Clostridium difficile, not specified as recurrent: Secondary | ICD-10-CM | POA: Diagnosis present

## 2022-01-15 DIAGNOSIS — F17210 Nicotine dependence, cigarettes, uncomplicated: Secondary | ICD-10-CM | POA: Diagnosis present

## 2022-01-15 DIAGNOSIS — R000 Tachycardia, unspecified: Secondary | ICD-10-CM | POA: Diagnosis not present

## 2022-01-15 DIAGNOSIS — M545 Low back pain, unspecified: Secondary | ICD-10-CM | POA: Diagnosis present

## 2022-01-15 DIAGNOSIS — K50118 Crohn's disease of large intestine with other complication: Secondary | ICD-10-CM

## 2022-01-15 DIAGNOSIS — Z880 Allergy status to penicillin: Secondary | ICD-10-CM

## 2022-01-15 DIAGNOSIS — K219 Gastro-esophageal reflux disease without esophagitis: Secondary | ICD-10-CM | POA: Diagnosis present

## 2022-01-15 DIAGNOSIS — K6289 Other specified diseases of anus and rectum: Secondary | ICD-10-CM | POA: Diagnosis not present

## 2022-01-15 DIAGNOSIS — R160 Hepatomegaly, not elsewhere classified: Secondary | ICD-10-CM | POA: Diagnosis not present

## 2022-01-15 DIAGNOSIS — J449 Chronic obstructive pulmonary disease, unspecified: Secondary | ICD-10-CM | POA: Diagnosis present

## 2022-01-15 DIAGNOSIS — Z743 Need for continuous supervision: Secondary | ICD-10-CM | POA: Diagnosis not present

## 2022-01-15 DIAGNOSIS — I10 Essential (primary) hypertension: Secondary | ICD-10-CM | POA: Diagnosis present

## 2022-01-15 DIAGNOSIS — F4310 Post-traumatic stress disorder, unspecified: Secondary | ICD-10-CM | POA: Diagnosis present

## 2022-01-15 HISTORY — DX: Gastrointestinal hemorrhage, unspecified: K92.2

## 2022-01-15 LAB — CBC WITH DIFFERENTIAL/PLATELET
Abs Immature Granulocytes: 0.16 10*3/uL — ABNORMAL HIGH (ref 0.00–0.07)
Basophils Absolute: 0 10*3/uL (ref 0.0–0.1)
Basophils Relative: 0 %
Eosinophils Absolute: 0 10*3/uL (ref 0.0–0.5)
Eosinophils Relative: 0 %
HCT: 31 % — ABNORMAL LOW (ref 36.0–46.0)
Hemoglobin: 9.8 g/dL — ABNORMAL LOW (ref 12.0–15.0)
Immature Granulocytes: 2 %
Lymphocytes Relative: 19 %
Lymphs Abs: 1.5 10*3/uL (ref 0.7–4.0)
MCH: 31.2 pg (ref 26.0–34.0)
MCHC: 31.6 g/dL (ref 30.0–36.0)
MCV: 98.7 fL (ref 80.0–100.0)
Monocytes Absolute: 0.3 10*3/uL (ref 0.1–1.0)
Monocytes Relative: 4 %
Neutro Abs: 6 10*3/uL (ref 1.7–7.7)
Neutrophils Relative %: 75 %
Platelets: 330 10*3/uL (ref 150–400)
RBC: 3.14 MIL/uL — ABNORMAL LOW (ref 3.87–5.11)
RDW: 15 % (ref 11.5–15.5)
WBC: 8 10*3/uL (ref 4.0–10.5)
nRBC: 0 % (ref 0.0–0.2)

## 2022-01-15 LAB — BASIC METABOLIC PANEL
Anion gap: 8 (ref 5–15)
BUN: 11 mg/dL (ref 6–20)
CO2: 27 mmol/L (ref 22–32)
Calcium: 8.7 mg/dL — ABNORMAL LOW (ref 8.9–10.3)
Chloride: 102 mmol/L (ref 98–111)
Creatinine, Ser: 0.67 mg/dL (ref 0.44–1.00)
GFR, Estimated: >60 mL/min (ref >60)
Glucose, Bld: 143 mg/dL — ABNORMAL HIGH (ref 70–99)
Potassium: 4.2 mmol/L (ref 3.5–5.1)
Sodium: 137 mmol/L (ref 135–145)

## 2022-01-15 LAB — POC OCCULT BLOOD, ED: Fecal Occult Bld: POSITIVE — AB

## 2022-01-15 MED ORDER — DULOXETINE HCL 30 MG PO CPEP
30.0000 mg | ORAL_CAPSULE | Freq: Every day | ORAL | Status: DC
  Administered 2022-01-16 – 2022-01-18 (×3): 30 mg via ORAL
  Filled 2022-01-15 (×3): qty 1

## 2022-01-15 MED ORDER — IOHEXOL 350 MG/ML SOLN
100.0000 mL | Freq: Once | INTRAVENOUS | Status: AC | PRN
  Administered 2022-01-15: 100 mL via INTRAVENOUS

## 2022-01-15 MED ORDER — TIOTROPIUM BROMIDE MONOHYDRATE 2.5 MCG/ACT IN AERS: 1.0000 | INHALATION_SPRAY | Freq: Every day | RESPIRATORY_TRACT | Status: DC

## 2022-01-15 MED ORDER — HYDROXYZINE HCL 25 MG PO TABS
25.0000 mg | ORAL_TABLET | Freq: Three times a day (TID) | ORAL | Status: DC | PRN
  Administered 2022-01-17 – 2022-01-18 (×3): 25 mg via ORAL
  Filled 2022-01-15 (×4): qty 1

## 2022-01-15 MED ORDER — APIXABAN 5 MG PO TABS: 5.0000 mg | ORAL_TABLET | Freq: Two times a day (BID) | ORAL | Status: DC

## 2022-01-15 MED ORDER — CIPROFLOXACIN IN D5W 400 MG/200ML IV SOLN
400.0000 mg | Freq: Two times a day (BID) | INTRAVENOUS | Status: DC
  Administered 2022-01-16: 400 mg via INTRAVENOUS
  Filled 2022-01-15: qty 200

## 2022-01-15 MED ORDER — METRONIDAZOLE 500 MG PO TABS
500.0000 mg | ORAL_TABLET | Freq: Once | ORAL | Status: AC
  Administered 2022-01-15: 500 mg via ORAL
  Filled 2022-01-15: qty 1

## 2022-01-15 MED ORDER — FENTANYL CITRATE PF 50 MCG/ML IJ SOSY
50.0000 ug | PREFILLED_SYRINGE | Freq: Once | INTRAMUSCULAR | Status: AC
  Administered 2022-01-15: 50 ug via INTRAVENOUS
  Filled 2022-01-15: qty 1

## 2022-01-15 MED ORDER — FOLIC ACID 1 MG PO TABS
1.0000 mg | ORAL_TABLET | Freq: Every day | ORAL | Status: DC
  Administered 2022-01-16 – 2022-01-18 (×3): 1 mg via ORAL
  Filled 2022-01-15 (×3): qty 1

## 2022-01-15 MED ORDER — ONDANSETRON HCL 4 MG PO TABS: 4.0000 mg | ORAL_TABLET | Freq: Four times a day (QID) | ORAL | Status: DC | PRN

## 2022-01-15 MED ORDER — CIPROFLOXACIN IN D5W 400 MG/200ML IV SOLN
400.0000 mg | Freq: Once | INTRAVENOUS | Status: AC
  Administered 2022-01-15: 400 mg via INTRAVENOUS
  Filled 2022-01-15: qty 200

## 2022-01-15 MED ORDER — ALBUTEROL SULFATE HFA 108 (90 BASE) MCG/ACT IN AERS: 2.0000 | INHALATION_SPRAY | RESPIRATORY_TRACT | Status: DC | PRN

## 2022-01-15 MED ORDER — ACETAMINOPHEN 325 MG PO TABS: 650.0000 mg | ORAL_TABLET | Freq: Four times a day (QID) | ORAL | Status: DC | PRN

## 2022-01-15 MED ORDER — ONDANSETRON HCL 4 MG/2ML IJ SOLN
4.0000 mg | Freq: Four times a day (QID) | INTRAMUSCULAR | Status: DC | PRN
  Administered 2022-01-16 – 2022-01-17 (×2): 4 mg via INTRAVENOUS
  Filled 2022-01-15 (×2): qty 2

## 2022-01-15 MED ORDER — ALPRAZOLAM 1 MG PO TABS
1.0000 mg | ORAL_TABLET | Freq: Three times a day (TID) | ORAL | Status: DC | PRN
  Administered 2022-01-16 – 2022-01-18 (×7): 1 mg via ORAL
  Filled 2022-01-15 (×7): qty 1

## 2022-01-15 MED ORDER — METRONIDAZOLE 500 MG/100ML IV SOLN: 500.0000 mg | Freq: Two times a day (BID) | INTRAVENOUS | Status: DC

## 2022-01-15 MED ORDER — LEVETIRACETAM 500 MG PO TABS
750.0000 mg | ORAL_TABLET | Freq: Two times a day (BID) | ORAL | Status: DC
  Administered 2022-01-16 – 2022-01-18 (×6): 750 mg via ORAL
  Filled 2022-01-15 (×6): qty 1

## 2022-01-15 MED ORDER — FENTANYL CITRATE PF 50 MCG/ML IJ SOSY
12.5000 ug | PREFILLED_SYRINGE | INTRAMUSCULAR | Status: DC | PRN
  Administered 2022-01-16: 50 ug via INTRAVENOUS
  Administered 2022-01-16: 25 ug via INTRAVENOUS
  Administered 2022-01-16 (×2): 50 ug via INTRAVENOUS
  Administered 2022-01-16: 25 ug via INTRAVENOUS
  Administered 2022-01-17: 50 ug via INTRAVENOUS
  Administered 2022-01-17: 25 ug via INTRAVENOUS
  Filled 2022-01-15 (×8): qty 1

## 2022-01-15 MED ORDER — ACETAMINOPHEN 650 MG RE SUPP: 650.0000 mg | Freq: Four times a day (QID) | RECTAL | Status: DC | PRN

## 2022-01-15 MED ORDER — SODIUM CHLORIDE 0.9 % IV SOLN: INTRAVENOUS | Status: DC

## 2022-01-15 MED ORDER — AMITRIPTYLINE HCL 25 MG PO TABS
75.0000 mg | ORAL_TABLET | Freq: Every day | ORAL | Status: DC
  Administered 2022-01-16 – 2022-01-17 (×3): 75 mg via ORAL
  Filled 2022-01-15 (×3): qty 3

## 2022-01-15 MED ORDER — TRAMADOL HCL 50 MG PO TABS
50.0000 mg | ORAL_TABLET | Freq: Four times a day (QID) | ORAL | Status: DC | PRN
  Filled 2022-01-15: qty 1

## 2022-01-15 NOTE — ED Provider Notes (Signed)
[facility] EMERGENCY DEPARTMENT Provider Note   CSN: [id number] Arrival date & time: 01/15/22  2038     History  Chief Complaint  Patient presents with   Rectal Bleeding    [name] is a 51 y.o. female.   Rectal Bleeding    Patient has a history of anxiety low back pain, GERD, IBS, Crohn's disease, COPD, hypertension, collagen vascular disease, PSVT who presents to the ED with complaints of rectal bleeding.  Patient has complicated medical history with her Crohn's disease.  Patient has seen numerous GI doctors.  Patient has required oral steroids.  Patient states she is being referred for a possible colostomy.  Patient has noticed in the last couple days some blood in her stool.  Initially she thought it could just be from hemorrhoids.  Today however she started noticing clots and more significant bleeding.  She has not had any vomiting.  She denies any severe abdominal pain.Labs consistent with hemoconcentration she denies any fevers or chills.  She does take anticoagulation because of prior blood clots Home Medications Prior to Admission medications   Medication Sig Start Date End Date Taking? Authorizing Provider  acetaminophen (TYLENOL) 325 MG tablet Take 2 tablets (650 mg total) by mouth every 6 (six) hours as needed for mild pain or headache (or Fever >/= 101). 01/03/22   [name], MD  albuterol (VENTOLIN HFA) 108 (90 Base) MCG/ACT inhaler Inhale 2 puffs into the lungs every 4 (four) hours as needed for wheezing or shortness of breath. 01/24/21   Roxan Hockey, MD  ALPRAZolam [name]) 1 MG tablet Take 1 mg by mouth 3 (three) times daily as needed for anxiety. 07/11/19   [provider]  amitriptyline (ELAVIL) 75 MG tablet Take 75 mg by mouth at bedtime. 12/16/20   [provider]  apixaban (ELIQUIS) 5 MG TABS tablet Take 2 tablets by mouth twice a day for 4 days; then 1 tablet by mouth twice a day daily. 01/06/22   [name], MD  cyclobenzaprine  (FLEXERIL) 10 MG tablet Take 10 mg by mouth 3 (three) times daily as needed for muscle spasms. 07/11/19   [provider]  DULoxetine (CYMBALTA) 30 MG capsule Take 30 mg by mouth daily. 12/17/21   [provider]  feeding supplement (ENSURE ENLIVE / ENSURE PLUS) LIQD Take 237 mLs by mouth 2 (two) times daily between meals. 01/04/22   [name], MD  folic acid (FOLVITE) 1 MG tablet Take 1 tablet (1 mg total) by mouth daily. 10/03/21   Orson Eva, MD  hydrOXYzine (ATARAX) 25 MG tablet Take 25 mg by mouth 3 (three) times daily as needed for anxiety. 12/13/21   [provider]  levETIRAcetam (KEPPRA) 750 MG tablet Take 750 mg by mouth 2 (two) times daily. 09/30/20   [provider]  nitroGLYCERIN (NITROSTAT) 0.4 MG SL tablet Place 1 tablet (0.4 mg total) under the tongue every 5 (five) minutes as needed for chest pain. 03/19/21   Rehman, Mechele Dawley, MD  ondansetron (ZOFRAN-ODT) 4 MG disintegrating tablet Take 4 mg by mouth every 8 (eight) hours as needed for nausea or vomiting. 09/07/21   [provider]  pantoprazole (PROTONIX) 40 MG tablet Take 1 tablet (40 mg total) by mouth 2 (two) times daily. 01/03/22   [name], MD  predniSONE (DELTASONE) 10 MG tablet Prednisone 60 mg daily for 1 week, prednisone 50 mg daily for 1 week, prednisone 40 mg daily for 1 week and then decrease by 5 mg weekly.  01/03/22   [name], MD  RESTASIS 0.05 % ophthalmic emulsion Place 1 drop into both eyes 2 (two) times daily. 12/12/20   [provider]  SPIRIVA RESPIMAT 2.5 MCG/ACT AERS Inhale 1 puff into the lungs daily. 01/03/22   [name], MD  triamcinolone cream (KENALOG) 0.1 % Apply topically 2 (two) times daily. 10/03/21   Orson Eva, MD  vancomycin (VANCOCIN) 250 MG capsule vancomycin taper: 500 mg orally 4 times daily for 14 days, then 500 mg orally 2 times daily for 7 days, then 500 mg orally once daily for 7 days, then 500 mg orally every other day for 4 weeks.  01/03/22   [name], MD  Vitamin D, Ergocalciferol, (DRISDOL) 1.25 MG (50000 UNIT) CAPS capsule Take 50,000 Units by mouth once a week. 12/14/21   [provider]      Allergies    Codeine, Dilaudid [hydromorphone hcl], Hydrocodone, Morphine and related, Oxycodone, Penicillins, Acetaminophen, Ativan [lorazepam], Humira [adalimumab], Strawberry extract, Watermelon concentrate, Albuterol, Benadryl [diphenhydramine hcl], Latex, Remicade [infliximab], and Tramadol    Review of Systems   Review of Systems  Gastrointestinal:  Positive for hematochezia.    Physical Exam Updated Vital Signs BP (!) 110/51   Pulse 99   Temp 98 F (36.7 C) (Oral)   Resp 19   Ht 1.626 m (5' 4" )   Wt 61.2 kg   SpO2 97%   BMI 23.17 kg/m  Physical Exam Vitals and nursing note reviewed.  Constitutional:      Appearance: She is well-developed. She is ill-appearing.  HENT:     Head: Normocephalic and atraumatic.     Right Ear: External ear normal.     Left Ear: External ear normal.  Eyes:     General: No scleral icterus.       Right eye: No discharge.        Left eye: No discharge.     Conjunctiva/sclera: Conjunctivae normal.  Neck:     Trachea: No tracheal deviation.  Cardiovascular:     Rate and Rhythm: Normal rate and regular rhythm.  Pulmonary:     Effort: Pulmonary effort is normal. No respiratory distress.     Breath sounds: Normal breath sounds. No stridor. No wheezing or rales.  Abdominal:     General: Bowel sounds are normal. There is no distension.     Palpations: Abdomen is soft.     Tenderness: There is no guarding or rebound.     Comments: Mild tenderness, no rebound or guarding  Musculoskeletal:        General: No tenderness or deformity.     Cervical back: Neck supple.  Skin:    General: Skin is warm and dry.     Coloration: Skin is pale.     Findings: No rash.  Neurological:     General: No focal deficit present.     Mental Status: She is alert.     Cranial Nerves:  No cranial nerve deficit (no facial droop, extraocular movements intact, no slurred speech).     Sensory: No sensory deficit.     Motor: No abnormal muscle tone or seizure activity.     Coordination: Coordination normal.  Psychiatric:        Mood and Affect: Mood normal.     ED Results / Procedures / Treatments   Labs (all labs ordered are listed, but only abnormal results are displayed) Labs Reviewed  CBC WITH DIFFERENTIAL/PLATELET - Abnormal; Notable for the following components:  Result Value   RBC 3.14 (*)    Hemoglobin 9.8 (*)    HCT 31.0 (*)    Abs Immature Granulocytes 0.16 (*)    All other components within normal limits  BASIC METABOLIC PANEL - Abnormal; Notable for the following components:   Glucose, Bld 143 (*)    Calcium 8.7 (*)    All other components within normal limits  POC OCCULT BLOOD, ED - Abnormal; Notable for the following components:   Fecal Occult Bld POSITIVE (*)    All other components within normal limits    EKG None  Radiology CT ABDOMEN PELVIS W CONTRAST  Result Date: 01/15/2022 CLINICAL DATA:  Crohn's exacerbation EXAM: CT ABDOMEN AND PELVIS WITH CONTRAST TECHNIQUE: Multidetector CT imaging of the abdomen and pelvis was performed using the standard protocol following bolus administration of intravenous contrast. RADIATION DOSE REDUCTION: This exam was performed according to the departmental dose-optimization program which includes automated exposure control, adjustment of the mA and/or kV according to patient size and/or use of iterative reconstruction technique. CONTRAST:  167m OMNIPAQUE IOHEXOL 350 MG/ML SOLN COMPARISON:  CT abdomen pelvis 04/30/2021, CT abdomen pelvis 12/23/2021, cm pelvis 02/03/2021, 7 pelvis 09/14/2015 FINDINGS: Lower chest: Interval decrease in trace bilateral pleural effusions, right greater than left. No acute abnormality. Persistent partially visualized at least trace to small volume pericardial effusion. Hepatobiliary:  Spleen is mildly enlarged measuring up to 18.5 cm. No focal liver abnormality. Status post cholecystectomy. No biliary dilatation. Pancreas: No focal lesion. Normal pancreatic contour. No surrounding inflammatory changes. No main pancreatic ductal dilatation. Spleen: Normal in size without focal abnormality. Adrenals/Urinary Tract: No adrenal nodule bilaterally. Bilateral kidneys enhance symmetrically. No hydronephrosis. No hydroureter. The urinary bladder is unremarkable. On delayed imaging, there is no urothelial wall thickening and there are no filling defects in the opacified portions of the bilateral collecting systems or ureters. Stomach/Bowel: Stomach is within normal limits. Bowel wall thickening and mucosal hyperemia of the rectum and distal sigmoid colon. Associated mild pericolonic fat stranding. Mild presacral soft tissue edema. No evidence of small bowel wall thickening or dilatation. Stool noted throughout the ascending, transverse, descending colon. The appendix is not definitely identified with no inflammatory changes in the right lower quadrant to suggest acute appendicitis. Vascular/Lymphatic: No abdominal aorta or iliac aneurysm. Mild atherosclerotic plaque of the aorta and its branches. No abdominal, pelvic, or inguinal lymphadenopathy. Reproductive: Status post hysterectomy. No adnexal masses. Other: No intraperitoneal free fluid. No intraperitoneal free gas. No organized fluid collection. Musculoskeletal: No abdominal wall hernia or abnormality. No suspicious lytic or blastic osseous lesions. No acute displaced fracture. Multilevel degenerative changes of the spine. IMPRESSION: 1. Persistent proctocolitis involving the rectosigmoid colon. Recommend colonoscopy status post treatment and status post complete resolution of inflammatory changes to exclude an underlying lesion. 2. Interval decrease in trace bilateral pleural effusions, right greater than left. 3. Partially visualized at least trace  to small volume pericardial effusion. 4. Mild hepatomegaly. Electronically Signed   By: MIven FinnM.D.   On: 01/15/2022 23:30    Procedures Procedures    Medications Ordered in ED Medications  fentaNYL (SUBLIMAZE) injection 50 mcg (has no administration in time range)  ciprofloxacin (CIPRO) IVPB 400 mg (has no administration in time range)  metroNIDAZOLE (FLAGYL) tablet 500 mg (has no administration in time range)  iohexol (OMNIPAQUE) 350 MG/ML injection 100 mL (100 mLs Intravenous Contrast Given 01/15/22 2309)    ED Course/ Medical Decision Making/ A&P Clinical Course as of 01/15/22 2348  Fri Jan 15, 2022  2347 CBC with Differential(!) Hemoglobin stable compared to previous [JK]  6060 Basic metabolic panel(!) Metabolic panel normal [JK]  2347 POC occult blood, ED RN will collect(!) [JK]  2347 Hemoccult positive [JK]  2347 CT ABDOMEN PELVIS W CONTRAST CT scan shows a proctocolitis [JK]  2347 Case discussed with Dr. Clearence Ped [JK]    Clinical Course User Index [JK] Dorie Rank, MD                           Medical Decision Making Problems Addressed: Gastrointestinal hemorrhage, unspecified gastrointestinal hemorrhage type: acute illness or injury that poses a threat to life or bodily functions Proctocolitis: acute illness or injury that poses a threat to life or bodily functions  Amount and/or Complexity of Data Reviewed Labs: ordered. Decision-making details documented in ED Course. Radiology: ordered. Decision-making details documented in ED Course.  Risk Prescription drug management. Parenteral controlled substances. Drug therapy requiring intensive monitoring for toxicity. Decision regarding hospitalization.   Patient presented to the ED for evaluation of abdominal pain bloody stool.  Patient also was noticing blood clots.  She does have complicated GI history.  Patient has been in the hospital for rectal bleeding as well as sepsis.  Patient CT scan shows a  persistent proctocolitis.  She does have blood in her stool but fortunately does not require blood transfusion at this time.  Have started patient on IV antibiotics.  She has been given IV fluids and pain medications.  I will consult the medical service for admission.        Final Clinical Impression(s) / ED Diagnoses Final diagnoses:  Proctocolitis  Gastrointestinal hemorrhage, unspecified gastrointestinal hemorrhage type      Dorie Rank, MD 01/15/22 2348

## 2022-01-15 NOTE — ED Notes (Signed)
Patient transported to CT 

## 2022-01-15 NOTE — ED Triage Notes (Signed)
Pt arrived via [facility] c/o persistent bloody stool and rectal bleeding. Pt had colonoscopy 1 year ago and has been having reoccurring rectal bleeding since. Pt currently taking blood thinner as well and has Hx of previous blood transfusions. Pt present with bilateral lower extremity edema and presents more pale than her baseline. Pt reports trying to have a procedure 1-2 months ago to rectify her bleeding problem but reports being too inflamed to have the procedure done.

## 2022-01-16 DIAGNOSIS — J449 Chronic obstructive pulmonary disease, unspecified: Secondary | ICD-10-CM | POA: Diagnosis not present

## 2022-01-16 DIAGNOSIS — K529 Noninfective gastroenteritis and colitis, unspecified: Secondary | ICD-10-CM | POA: Diagnosis not present

## 2022-01-16 DIAGNOSIS — Z9102 Food additives allergy status: Secondary | ICD-10-CM | POA: Diagnosis not present

## 2022-01-16 DIAGNOSIS — E119 Type 2 diabetes mellitus without complications: Secondary | ICD-10-CM | POA: Diagnosis not present

## 2022-01-16 DIAGNOSIS — G8929 Other chronic pain: Secondary | ICD-10-CM | POA: Diagnosis not present

## 2022-01-16 DIAGNOSIS — F419 Anxiety disorder, unspecified: Secondary | ICD-10-CM

## 2022-01-16 DIAGNOSIS — K50111 Crohn's disease of large intestine with rectal bleeding: Secondary | ICD-10-CM | POA: Diagnosis not present

## 2022-01-16 DIAGNOSIS — I10 Essential (primary) hypertension: Secondary | ICD-10-CM | POA: Diagnosis not present

## 2022-01-16 DIAGNOSIS — I82621 Acute embolism and thrombosis of deep veins of right upper extremity: Secondary | ICD-10-CM

## 2022-01-16 DIAGNOSIS — M545 Low back pain, unspecified: Secondary | ICD-10-CM | POA: Diagnosis not present

## 2022-01-16 DIAGNOSIS — A0472 Enterocolitis due to Clostridium difficile, not specified as recurrent: Secondary | ICD-10-CM | POA: Diagnosis not present

## 2022-01-16 DIAGNOSIS — Z91040 Latex allergy status: Secondary | ICD-10-CM | POA: Diagnosis not present

## 2022-01-16 DIAGNOSIS — K219 Gastro-esophageal reflux disease without esophagitis: Secondary | ICD-10-CM

## 2022-01-16 DIAGNOSIS — Z7901 Long term (current) use of anticoagulants: Secondary | ICD-10-CM | POA: Diagnosis not present

## 2022-01-16 DIAGNOSIS — G9341 Metabolic encephalopathy: Secondary | ICD-10-CM

## 2022-01-16 DIAGNOSIS — Z8249 Family history of ischemic heart disease and other diseases of the circulatory system: Secondary | ICD-10-CM | POA: Diagnosis not present

## 2022-01-16 DIAGNOSIS — K922 Gastrointestinal hemorrhage, unspecified: Secondary | ICD-10-CM | POA: Diagnosis not present

## 2022-01-16 DIAGNOSIS — Z888 Allergy status to other drugs, medicaments and biological substances status: Secondary | ICD-10-CM | POA: Diagnosis not present

## 2022-01-16 DIAGNOSIS — Z79899 Other long term (current) drug therapy: Secondary | ICD-10-CM | POA: Diagnosis not present

## 2022-01-16 DIAGNOSIS — Z833 Family history of diabetes mellitus: Secondary | ICD-10-CM | POA: Diagnosis not present

## 2022-01-16 DIAGNOSIS — Z886 Allergy status to analgesic agent status: Secondary | ICD-10-CM | POA: Diagnosis not present

## 2022-01-16 DIAGNOSIS — K50118 Crohn's disease of large intestine with other complication: Secondary | ICD-10-CM | POA: Diagnosis not present

## 2022-01-16 DIAGNOSIS — Z885 Allergy status to narcotic agent status: Secondary | ICD-10-CM | POA: Diagnosis not present

## 2022-01-16 DIAGNOSIS — F4310 Post-traumatic stress disorder, unspecified: Secondary | ICD-10-CM | POA: Diagnosis present

## 2022-01-16 DIAGNOSIS — Z880 Allergy status to penicillin: Secondary | ICD-10-CM | POA: Diagnosis not present

## 2022-01-16 DIAGNOSIS — R69 Illness, unspecified: Secondary | ICD-10-CM | POA: Diagnosis not present

## 2022-01-16 DIAGNOSIS — F17210 Nicotine dependence, cigarettes, uncomplicated: Secondary | ICD-10-CM | POA: Diagnosis present

## 2022-01-16 LAB — CBC WITH DIFFERENTIAL/PLATELET
Abs Immature Granulocytes: 0.14 10*3/uL — ABNORMAL HIGH (ref 0.00–0.07)
Basophils Absolute: 0 10*3/uL (ref 0.0–0.1)
Basophils Relative: 0 %
Eosinophils Absolute: 0.1 10*3/uL (ref 0.0–0.5)
Eosinophils Relative: 2 %
HCT: 30.7 % — ABNORMAL LOW (ref 36.0–46.0)
Hemoglobin: 9.8 g/dL — ABNORMAL LOW (ref 12.0–15.0)
Immature Granulocytes: 2 %
Lymphocytes Relative: 32 %
Lymphs Abs: 2.2 10*3/uL (ref 0.7–4.0)
MCH: 31.6 pg (ref 26.0–34.0)
MCHC: 31.9 g/dL (ref 30.0–36.0)
MCV: 99 fL (ref 80.0–100.0)
Monocytes Absolute: 0.5 10*3/uL (ref 0.1–1.0)
Monocytes Relative: 8 %
Neutro Abs: 4 10*3/uL (ref 1.7–7.7)
Neutrophils Relative %: 56 %
Platelets: 281 10*3/uL (ref 150–400)
RBC: 3.1 MIL/uL — ABNORMAL LOW (ref 3.87–5.11)
RDW: 14.8 % (ref 11.5–15.5)
WBC: 7 10*3/uL (ref 4.0–10.5)
nRBC: 0 % (ref 0.0–0.2)

## 2022-01-16 LAB — COMPREHENSIVE METABOLIC PANEL
ALT: 13 U/L (ref 0–44)
AST: 10 U/L — ABNORMAL LOW (ref 15–41)
Albumin: 2.3 g/dL — ABNORMAL LOW (ref 3.5–5.0)
Alkaline Phosphatase: 56 U/L (ref 38–126)
Anion gap: 5 (ref 5–15)
BUN: 11 mg/dL (ref 6–20)
CO2: 29 mmol/L (ref 22–32)
Calcium: 8.7 mg/dL — ABNORMAL LOW (ref 8.9–10.3)
Chloride: 104 mmol/L (ref 98–111)
Creatinine, Ser: 0.59 mg/dL (ref 0.44–1.00)
GFR, Estimated: >60 mL/min (ref >60)
Glucose, Bld: 100 mg/dL — ABNORMAL HIGH (ref 70–99)
Potassium: 3.7 mmol/L (ref 3.5–5.1)
Sodium: 138 mmol/L (ref 135–145)
Total Bilirubin: 0.4 mg/dL (ref 0.3–1.2)
Total Protein: 4.8 g/dL — ABNORMAL LOW (ref 6.5–8.1)

## 2022-01-16 LAB — MAGNESIUM: Magnesium: 2.2 mg/dL (ref 1.7–2.4)

## 2022-01-16 MED ORDER — LORATADINE 10 MG PO TABS
10.0000 mg | ORAL_TABLET | Freq: Every day | ORAL | Status: DC
  Administered 2022-01-16 – 2022-01-18 (×3): 10 mg via ORAL
  Filled 2022-01-16 (×3): qty 1

## 2022-01-16 MED ORDER — PANTOPRAZOLE SODIUM 40 MG IV SOLR
40.0000 mg | Freq: Two times a day (BID) | INTRAVENOUS | Status: DC
  Administered 2022-01-16 – 2022-01-18 (×5): 40 mg via INTRAVENOUS
  Filled 2022-01-16 (×5): qty 10

## 2022-01-16 MED ORDER — NITROGLYCERIN 0.4 MG SL SUBL: 0.4000 mg | SUBLINGUAL_TABLET | SUBLINGUAL | Status: DC | PRN

## 2022-01-16 MED ORDER — UMECLIDINIUM BROMIDE 62.5 MCG/ACT IN AEPB
1.0000 | INHALATION_SPRAY | Freq: Every day | RESPIRATORY_TRACT | Status: DC
  Administered 2022-01-16 – 2022-01-18 (×3): 1 via RESPIRATORY_TRACT
  Filled 2022-01-16: qty 7

## 2022-01-16 MED ORDER — CYCLOSPORINE 0.05 % OP EMUL
1.0000 [drp] | Freq: Two times a day (BID) | OPHTHALMIC | Status: DC
  Administered 2022-01-16 – 2022-01-18 (×5): 1 [drp] via OPHTHALMIC
  Filled 2022-01-16 (×5): qty 30

## 2022-01-16 MED ORDER — ALBUTEROL SULFATE (2.5 MG/3ML) 0.083% IN NEBU: 2.5000 mg | INHALATION_SOLUTION | RESPIRATORY_TRACT | Status: DC | PRN

## 2022-01-16 MED ORDER — METRONIDAZOLE 500 MG/100ML IV SOLN: 500.0000 mg | Freq: Three times a day (TID) | INTRAVENOUS | Status: DC

## 2022-01-16 MED ORDER — VANCOMYCIN HCL 250 MG PO CAPS: 500.0000 mg | ORAL_CAPSULE | Freq: Four times a day (QID) | ORAL | Status: DC

## 2022-01-16 MED ORDER — MESALAMINE 4 G RE ENEM
4.0000 g | ENEMA | Freq: Every day | RECTAL | Status: DC
  Administered 2022-01-16 – 2022-01-17 (×2): 4 g via RECTAL
  Filled 2022-01-16 (×4): qty 60

## 2022-01-16 MED ORDER — VANCOMYCIN HCL 125 MG PO CAPS: 500.0000 mg | ORAL_CAPSULE | Freq: Every day | ORAL | Status: DC

## 2022-01-16 MED ORDER — PREDNISONE 20 MG PO TABS
40.0000 mg | ORAL_TABLET | Freq: Every day | ORAL | Status: DC
  Administered 2022-01-16 – 2022-01-18 (×3): 40 mg via ORAL
  Filled 2022-01-16 (×3): qty 2

## 2022-01-16 MED ORDER — VANCOMYCIN HCL 125 MG PO CAPS
500.0000 mg | ORAL_CAPSULE | Freq: Two times a day (BID) | ORAL | Status: DC
  Filled 2022-01-16 (×4): qty 4

## 2022-01-16 MED ORDER — CYCLOBENZAPRINE HCL 10 MG PO TABS
10.0000 mg | ORAL_TABLET | Freq: Three times a day (TID) | ORAL | Status: DC | PRN
  Administered 2022-01-16 – 2022-01-18 (×5): 10 mg via ORAL
  Filled 2022-01-16 (×5): qty 1

## 2022-01-16 MED ORDER — ENSURE ENLIVE PO LIQD
237.0000 mL | Freq: Two times a day (BID) | ORAL | Status: DC
  Administered 2022-01-16 – 2022-01-18 (×4): 237 mL via ORAL

## 2022-01-16 MED ORDER — VANCOMYCIN HCL 125 MG PO CAPS
500.0000 mg | ORAL_CAPSULE | Freq: Four times a day (QID) | ORAL | Status: AC
  Administered 2022-01-16 – 2022-01-18 (×8): 500 mg via ORAL
  Filled 2022-01-16 (×8): qty 2

## 2022-01-16 MED ORDER — VANCOMYCIN HCL 125 MG PO CAPS: 500.0000 mg | ORAL_CAPSULE | ORAL | Status: DC

## 2022-01-16 NOTE — Assessment & Plan Note (Addendum)
-   History of Crohn's - Continue vancomycin taper per GI  - Continue pain control and oral steroid taper prednisone 40 mg - CT abdomen shows proctocolitis involving the rectosigmoid colon

## 2022-01-16 NOTE — Assessment & Plan Note (Signed)
-   Given GI bleed will do IV Protonix twice daily

## 2022-01-16 NOTE — Assessment & Plan Note (Signed)
-   Holding apixaban temporarily in the setting of GI bleed

## 2022-01-16 NOTE — Assessment & Plan Note (Signed)
-   Continue Atarax and Xanax

## 2022-01-16 NOTE — Consult Note (Signed)
Consulting  Provider: Dr. Clearence Ped Primary Care Physician:  Teodoro Kil, PA-C Primary Gastroenterologist:  Dr. Jenetta Downer  Reason for Consultation: Proctitis, abdominal pain, Crohn's disease  HPI:  [name] is a 51 y.o. female with a past medical history of Crohn's disease complicated by colonic stricture, COPD, GERD, hypertension, tobacco use, asthma, type 2 diabetes, who presented to [name] ER yesterday evening with chief complaint of rectal bleeding.  Recently discharged from our facility 01/03/2022 after stay for Crohn's flare and C. difficile colitis.  Was placed on 8-week extended vancomycin taper as well as prednisone taper.  States she was doing well since discharge until yesterday when she had multiple episodes of rectal bleeding which prompted EMS.  Continues to complain of abdominal pain, primarily left and right lower quadrant.  Does state her diarrhea is improved compared to previous.  Notes 4-6 loose bowel movements daily.  States she believes she is taking her vancomycin though she is a poor historian.  States her daughter handles all of her medications.  Previously was having 15-20 loose stools a day.  Does note some rectal discomfort as well.  In the ER, CT abdomen pelvis with contrast which I personally reviewed showed persistent proctocolitis involving the rectosigmoid colon.  Also showed stool throughout the entire colon.  Patient has been started on IV Cipro and Flagyl.  In regards to her Crohn's disease, see full history below:  She has a history of Crohn's that was untreated for multiple years prior to starting Remicade.  Partially responsive to Remicade but developed allergic reaction with pruritis/erythematous rash. She developed similar rash on Humira and took only two doses. Plans to avoid anti-TNF. Encouraged to try Stelara/Skyrizi or surgical approach for ileostomy due to her history of stricturing disease but she refused for surgery in the past.  She  has been seen by Dr. Nyoka Cowden at [facility].  Plans for patient to see dermatology, ophthalmology, multidisciplinary IBD clinic with both GI and surgery.  She has been hospitalized this year at multiple facilities ([facility], [place]) has an appointment at [place] with Dr. Kennith Gain on September 18.  Missing today's appointment with Dr. Nyoka Cowden at [facility].   Underwent flexible sigmoidoscopy with GI December 11, 2021 at [facility].  Noted to have rectal stricture with inner diameter of 6 mm (exam accomplished with nasopharyngeal scope), circumferential ulceration present in the distal rectum and patchy ulcerations throughout the mid and proximal rectum.  Inflammation found in the rectosigmoid colon, mild in severity. Pathology with inactive chronic inflammatory disease of sigmoid colon, rectum with active chronic inflammatory disease and ulceration.  Started on prednisone taper at discharge.   Admitted to [facility] June 2023 with acute pancreatitis and colitis.  Tested positive for C. difficile, provided oral vancomycin.   Hospitalized [facility] July 19 through July 28.  C. difficile EIA - July 19.   Most recent attempted colonoscopy March 2023 was incomplete due to poor prep, revealed inflammation in the distal sigmoid colon and rectum which was severe but records of large amount of stool in the colon precluded visualization.  Presence of noncritical rectal stricture noted on DRE.  Pathology consistent with active colitis with ulceration and necroinflammatory debris.   November 18, 2021: C. difficile EIA negative.  GI pathogen panel negative.    December 11, 2021: C. difficile toxin EIA negative, PCR+ felt to be consistent with colonization. Giardia antigen  negative, cryptosporidium antigen negative, O&P negative, stool culture negative x2.   CT abdomen and pelvis with contrast December 09, 2021: Significant wall thickening and adjacent soft tissue stranding of the rectum which extends to the rectosigmoid junction.  Presacral soft tissue stranding.  Normal-appearing terminal ileum.  Past Medical History:  Diagnosis Date   Anxiety    Asthma    Chronic low back pain    Collagen vascular disease (HCC)    COPD (chronic obstructive pulmonary disease) (HCC)    Crohn's disease (HCC)    GERD (gastroesophageal reflux disease)    EGD 01/2007 by Dr.Rourke small hiatal hernia s/p 56 french maloney    History of head injury    Hypertension    IBS (irritable bowel syndrome)    PSVT (paroxysmal supraventricular tachycardia) (HCC)    PTSD (post-traumatic stress disorder)    Recurrent chest pain    Seizure disorder ([employer])    Type 2 diabetes mellitus ([place])     Past Surgical History:  Procedure Laterality Date   ABDOMINAL HYSTERECTOMY     with right salpingo oophorectomy 2005   APPENDECTOMY  2006   BALLOON DILATION N/A 02/19/2021   Procedure: BALLOON DILATION;  Surgeon: Eloise Harman, DO;  [facility];  Service: Endoscopy;  Laterality: N/A;  Sigmoid colon stricture   BIOPSY  02/06/2021   Procedure: BIOPSY;  Surgeon: Eloise Harman, DO;  [facility];  Service: Endoscopy;;   BIOPSY  02/19/2021   Procedure: BIOPSY;  Surgeon: Eloise Harman, DO;  [facility];  Service: Endoscopy;;   BIOPSY  07/15/2021   Procedure: BIOPSY;  Surgeon: Rogene Houston, MD;  [facility];  Service: Endoscopy;;   CESAREAN SECTION     1996   CHOLECYSTECTOMY     COLONOSCOPY  2010   Dr. Gala Romney; negative except for hemorrhoids   ESOPHAGEAL DILATION N/A 10/25/2014   Procedure: ESOPHAGEAL DILATION;  Surgeon: Rogene Houston, MD;  [facility];  Service: Endoscopy;  Laterality: N/A;   ESOPHAGOGASTRODUODENOSCOPY N/A 10/25/2014   Procedure: ESOPHAGOGASTRODUODENOSCOPY (EGD);  Surgeon: Rogene Houston, MD;  [facility];  Service: Endoscopy;   Laterality: N/A;  [place] N/A 02/06/2021   Procedure: FLEXIBLE SIGMOIDOSCOPY;  Surgeon: Eloise Harman, DO;  [facility];  Service: Endoscopy;  Laterality: N/A;   FLEXIBLE SIGMOIDOSCOPY N/A 02/19/2021   Procedure: FLEXIBLE SIGMOIDOSCOPY;  Surgeon: Eloise Harman, DO;  [facility];  Service: Endoscopy;  Laterality: N/A;   OOPHORECTOMY     left for torsion and ovarian fibroma; uterine myoma resected 1995   SIGMOIDOSCOPY  07/15/2021   Procedure: SIGMOIDOSCOPY;  Surgeon: Rogene Houston, MD;  [facility];  Service: Endoscopy;;    Prior to Admission medications   Medication Sig Start Date End Date Taking? Authorizing Provider  acetaminophen (TYLENOL) 325 MG tablet Take 2 tablets (650 mg total) by mouth every 6 (six) hours as needed for mild pain or headache (or Fever >/= 101). 01/03/22   [name], MD  albuterol (VENTOLIN HFA) 108 (90 Base) MCG/ACT inhaler Inhale 2 puffs into the lungs every 4 (four) hours as needed for wheezing or shortness of breath. 01/24/21   Roxan Hockey, MD  ALPRAZolam [name]) 1 MG tablet Take 1 mg by mouth 3 (three) times daily as needed for anxiety. 07/11/19   [provider]  amitriptyline (ELAVIL) 75 MG tablet Take 75 mg by mouth at bedtime.  12/16/20   [provider]  apixaban (ELIQUIS) 5 MG TABS tablet Take 2 tablets by mouth twice a day for 4 days; then 1 tablet by mouth twice a day daily. 01/06/22   [name], MD  cyclobenzaprine (FLEXERIL) 10 MG tablet Take 10 mg by mouth 3 (three) times daily as needed for muscle spasms. 07/11/19   [provider]  DULoxetine (CYMBALTA) 30 MG capsule Take 30 mg by mouth daily. 12/17/21   [provider]  feeding supplement (ENSURE ENLIVE / ENSURE PLUS) LIQD Take 237 mLs by mouth 2 (two) times daily between meals. 01/04/22   [name], MD  folic acid (FOLVITE) 1 MG tablet Take 1 tablet (1 mg total) by mouth daily. 10/03/21   Orson Eva, MD  hydrOXYzine (ATARAX) 25 MG tablet Take 25 mg by mouth 3 (three) times daily as needed for anxiety. 12/13/21   [provider]  levETIRAcetam (KEPPRA) 750 MG tablet Take 750 mg by mouth 2 (two) times daily. 09/30/20   [provider]  nitroGLYCERIN (NITROSTAT) 0.4 MG SL tablet Place 1 tablet (0.4 mg total) under the tongue every 5 (five) minutes as needed for chest pain. 03/19/21   Rehman, Mechele Dawley, MD  ondansetron (ZOFRAN-ODT) 4 MG disintegrating tablet Take 4 mg by mouth every 8 (eight) hours as needed for nausea or vomiting. 09/07/21   [provider]  pantoprazole (PROTONIX) 40 MG tablet Take 1 tablet (40 mg total) by mouth 2 (two) times daily. 01/03/22   [name], MD  predniSONE (DELTASONE) 10 MG tablet Prednisone 60 mg daily for 1 week, prednisone 50 mg daily for 1 week, prednisone 40 mg daily for 1 week and then decrease by 5 mg weekly. 01/03/22   [name], MD  RESTASIS 0.05 % ophthalmic emulsion Place 1 drop into both eyes 2 (two) times daily. 12/12/20   [provider]  SPIRIVA RESPIMAT 2.5 MCG/ACT AERS Inhale 1 puff into the lungs daily. 01/03/22   [name], MD  triamcinolone cream (KENALOG) 0.1 % Apply topically 2 (two) times daily. 10/03/21   Orson Eva, MD  vancomycin (VANCOCIN) 250 MG capsule vancomycin taper: 500 mg orally 4 times daily for 14 days, then 500 mg orally 2 times daily for 7 days, then 500 mg orally once daily for 7 days, then 500 mg orally every other day for 4 weeks. 01/03/22   [name], MD  Vitamin D, Ergocalciferol, (DRISDOL) 1.25 MG (50000 UNIT) CAPS capsule Take 50,000 Units by mouth once a week. 12/14/21   [provider]    Current Facility-Administered Medications  Medication Dose Route Frequency Provider Last Rate Last Admin   0.9 %  sodium chloride infusion   Intravenous Continuous Zierle-Ghosh, Asia B, DO 75 mL/hr at 01/16/22 0917 Infusion Verify at 01/16/22 4287   acetaminophen (TYLENOL) tablet  650 mg  650 mg Oral Q6H PRN Zierle-Ghosh, Asia B, DO       Or   acetaminophen (TYLENOL) suppository 650 mg  650 mg Rectal Q6H PRN Zierle-Ghosh, Asia B, DO       albuterol (PROVENTIL) (2.5 MG/3ML) 0.083% nebulizer solution 2.5 mg  2.5 mg Nebulization Q4H PRN Zierle-Ghosh, Asia B, DO       ALPRAZolam (XANAX) tablet 1 mg  1 mg Oral TID PRN Zierle-Ghosh, Asia B, DO   1 mg at 01/16/22 0137   amitriptyline (ELAVIL) tablet 75 mg  75 mg Oral QHS Zierle-Ghosh, Asia B, DO   75 mg at 01/16/22 0009  ciprofloxacin (CIPRO) IVPB 400 mg  400 mg Intravenous Q12H Zierle-Ghosh, Asia B, DO 200 mL/hr at 01/16/22 1107 400 mg at 01/16/22 1107   cyclobenzaprine (FLEXERIL) tablet 10 mg  10 mg Oral TID PRN Zierle-Ghosh, Asia B, DO   10 mg at 01/16/22 1057   cycloSPORINE (RESTASIS) 0.05 % ophthalmic emulsion 1 drop  1 drop Both Eyes BID Johnson, Clanford L, MD       DULoxetine (CYMBALTA) DR capsule 30 mg  30 mg Oral Daily Zierle-Ghosh, Asia B, DO   30 mg at 01/16/22 1058   fentaNYL (SUBLIMAZE) injection 12.5-50 mcg  12.5-50 mcg Intravenous Q2H PRN Zierle-Ghosh, Asia B, DO   50 mcg at 96/78/93 8101   folic acid (FOLVITE) tablet 1 mg  1 mg Oral Daily Zierle-Ghosh, Asia B, DO   1 mg at 01/16/22 1059   hydrOXYzine (ATARAX) tablet 25 mg  25 mg Oral TID PRN Zierle-Ghosh, Asia B, DO       levETIRAcetam (KEPPRA) tablet 750 mg  750 mg Oral BID Zierle-Ghosh, Asia B, DO   750 mg at 01/16/22 1058   metroNIDAZOLE (FLAGYL) IVPB 500 mg  500 mg Intravenous Q12H Zierle-Ghosh, Asia B, DO       ondansetron (ZOFRAN) tablet 4 mg  4 mg Oral Q6H PRN Zierle-Ghosh, Asia B, DO       Or   ondansetron (ZOFRAN) injection 4 mg  4 mg Intravenous Q6H PRN Zierle-Ghosh, Asia B, DO   4 mg at 01/16/22 0507   pantoprazole (PROTONIX) injection 40 mg  40 mg Intravenous Q12H Zierle-Ghosh, Asia B, DO   40 mg at 01/16/22 1101   traMADol (ULTRAM) tablet 50 mg  50 mg Oral Q6H PRN Zierle-Ghosh, Asia B, DO       umeclidinium bromide (INCRUSE ELLIPTA) 62.5 MCG/ACT 1  puff  1 puff Inhalation Daily Zierle-Ghosh, Asia B, DO   1 puff at 01/16/22 0825    Allergies as of 01/15/2022 - Review Complete 01/15/2022  Allergen Reaction Noted   Codeine Shortness Of Breath, Swelling, and Rash    Dilaudid [hydromorphone hcl] Shortness Of Breath and Swelling 12/26/2012   Hydrocodone Shortness Of Breath, Swelling, and Rash 12/26/2012   Morphine and related Shortness Of Breath, Swelling, and Rash 12/26/2012   Oxycodone Shortness Of Breath, Swelling, and Rash 12/26/2012   Penicillins Shortness Of Breath, Swelling, and Other (See Comments)    Acetaminophen  12/26/2012   Ativan [lorazepam] Other (See Comments) 12/20/2016   Humira [adalimumab]  12/24/2021   Strawberry extract Swelling 07/22/2011   Watermelon concentrate Swelling 07/22/2011   Albuterol Palpitations 07/08/2021   Benadryl [diphenhydramine hcl] Palpitations 09/23/2011   Latex Rash    Remicade [infliximab] Rash 07/08/2021   Tramadol Rash 01/12/2022    Family History  Problem Relation Age of Onset   Cancer Mother    Heart failure Mother    Hypertension Mother    Heart attack Mother    Heart attack Father 54   Cancer Father 51   Heart failure Father    Diabetes Father    Crohn's disease Cousin     Social History   Socioeconomic History   Marital status: Divorced    Spouse name: Not on file   Number of children: Not on file   Years of education: Not on file   Highest education level: Not on file  Occupational History   Not on file  Tobacco Use   Smoking status: Some Days    Packs/day: 0.50    Years: 20.00  Total pack years: 10.00    Types: Cigarettes    Start date: 09/26/1984    Passive exposure: Current   Smokeless tobacco: Never  Vaping Use   Vaping Use: Never used  Substance and Sexual Activity   Alcohol use: No    Alcohol/week: 0.0 standard drinks of alcohol   Drug use: Not Currently   Sexual activity: Yes    Birth control/protection: Surgical  Other Topics Concern   Not on  file  Social History Narrative   Not on file   Social Determinants of Health   Financial Resource Strain: Not on file  Food Insecurity: No Food Insecurity (01/16/2022)   Hunger Vital Sign    Worried About Running Out of Food in the Last Year: Never true    Ran Out of Food in the Last Year: Never true  Transportation Needs: No Transportation Needs (01/16/2022)   PRAPARE - Hydrologist (Medical): No    Lack of Transportation (Non-Medical): No  Physical Activity: Not on file  Stress: Not on file  Social Connections: Not on file  Intimate Partner Violence: Not At Risk (01/16/2022)   Humiliation, Afraid, Rape, and Kick questionnaire    Fear of Current or Ex-Partner: No    Emotionally Abused: No    Physically Abused: No    Sexually Abused: No    Review of Systems: General: Negative for anorexia, weight loss, fever, chills, fatigue, weakness. Eyes: Negative for vision changes.  ENT: Negative for hoarseness, difficulty swallowing , nasal congestion. CV: Negative for chest pain, angina, palpitations, dyspnea on exertion, peripheral edema.  Respiratory: Negative for dyspnea at rest, dyspnea on exertion, cough, sputum, wheezing.  GI: See history of present illness. GU:  Negative for dysuria, hematuria, urinary incontinence, urinary frequency, nocturnal urination.  MS: Negative for joint pain, low back pain.  Derm: Negative for rash or itching.  Neuro: Negative for weakness, abnormal sensation, seizure, frequent headaches, memory loss, confusion.  Psych: Negative for anxiety, depression Endo: Negative for unusual weight change.  Heme: Negative for bruising or bleeding. Allergy: Negative for rash or hives.  Physical Exam: Vital signs in last 24 hours: Temp:  [97.7 F (36.5 C)-98 F (36.7 C)] 97.7 F (36.5 C) (09/16 0451) Pulse Rate:  [94-110] 104 (09/16 0451) Resp:  [14-19] 17 (09/16 0451) BP: (103-115)/(51-98) 113/98 (09/16 0451) SpO2:  [96 %-100 %] 97  % (09/16 0825) Weight:  [57.3 kg-61.2 kg] 57.3 kg (09/16 0535) Last BM Date : 01/16/22 General:   Alert,  Well-developed, well-nourished, pleasant and cooperative in NAD Head:  Normocephalic and atraumatic. Eyes:  Sclera clear, no icterus.   Conjunctiva pink. Ears:  Normal auditory acuity. Nose:  No deformity, discharge,  or lesions. Mouth:  No deformity or lesions, dentition normal. Neck:  Supple; no masses or thyromegaly. Lungs:  Clear throughout to auscultation.   No wheezes, crackles, or rhonchi. No acute distress. Heart:  Regular rate and rhythm; no murmurs, clicks, rubs,  or gallops. Abdomen:  Soft, nontender and nondistended. No masses, hepatosplenomegaly or hernias noted. Normal bowel sounds, without guarding, and without rebound.   Msk:  Symmetrical without gross deformities. Normal posture. Pulses:  Normal pulses noted. Extremities:  Without clubbing or edema. Neurologic:  Alert and  oriented x4;  grossly normal neurologically. Skin:  Intact without significant lesions or rashes. Cervical Nodes:  No significant cervical adenopathy. Psych:  Alert and cooperative. Normal mood and affect.  Intake/Output from previous day: 09/15 0701 - 09/16 0700 In: 200 [IV  [id number]] Out: -  Intake/Output this shift: Total I/O In: 916 [P.O.:240; I.V.:676] Out: -   Lab Results: Recent Labs    01/15/22 2050 01/16/22 0546  WBC 8.0 7.0  HGB 9.8* 9.8*  HCT 31.0* 30.7*  PLT 330 281   BMET Recent Labs    01/15/22 2050 01/16/22 0546  NA 137 138  K 4.2 3.7  CL 102 104  CO2 27 29  GLUCOSE 143* 100*  BUN 11 11  CREATININE 0.67 0.59  CALCIUM 8.7* 8.7*   LFT Recent Labs    01/16/22 0546  PROT 4.8*  ALBUMIN 2.3*  AST 10*  ALT 13  ALKPHOS 56  BILITOT 0.4   PT/INR No results for input(s): "LABPROT", "INR" in the last 72 hours. Hepatitis Panel No results for input(s): "HEPBSAG", "HCVAB", "HEPAIGM", "HEPBIGM" in the last 72 hours. C-Diff No results for input(s):  "CDIFFTOX" in the last 72 hours.  Studies/Results: CT ABDOMEN PELVIS W CONTRAST  Result Date: 01/15/2022 CLINICAL DATA:  Crohn's exacerbation EXAM: CT ABDOMEN AND PELVIS WITH CONTRAST TECHNIQUE: Multidetector CT imaging of the abdomen and pelvis was performed using the standard protocol following bolus administration of intravenous contrast. RADIATION DOSE REDUCTION: This exam was performed according to the departmental dose-optimization program which includes automated exposure control, adjustment of the mA and/or kV according to patient size and/or use of iterative reconstruction technique. CONTRAST:  125m OMNIPAQUE IOHEXOL 350 MG/ML SOLN COMPARISON:  CT abdomen pelvis 04/30/2021, CT abdomen pelvis 12/23/2021, cm pelvis 02/03/2021, 7 pelvis 09/14/2015 FINDINGS: Lower chest: Interval decrease in trace bilateral pleural effusions, right greater than left. No acute abnormality. Persistent partially visualized at least trace to small volume pericardial effusion. Hepatobiliary: Spleen is mildly enlarged measuring up to 18.5 cm. No focal liver abnormality. Status post cholecystectomy. No biliary dilatation. Pancreas: No focal lesion. Normal pancreatic contour. No surrounding inflammatory changes. No main pancreatic ductal dilatation. Spleen: Normal in size without focal abnormality. Adrenals/Urinary Tract: No adrenal nodule bilaterally. Bilateral kidneys enhance symmetrically. No hydronephrosis. No hydroureter. The urinary bladder is unremarkable. On delayed imaging, there is no urothelial wall thickening and there are no filling defects in the opacified portions of the bilateral collecting systems or ureters. Stomach/Bowel: Stomach is within normal limits. Bowel wall thickening and mucosal hyperemia of the rectum and distal sigmoid colon. Associated mild pericolonic fat stranding. Mild presacral soft tissue edema. No evidence of small bowel wall thickening or dilatation. Stool noted throughout the ascending,  transverse, descending colon. The appendix is not definitely identified with no inflammatory changes in the right lower quadrant to suggest acute appendicitis. Vascular/Lymphatic: No abdominal aorta or iliac aneurysm. Mild atherosclerotic plaque of the aorta and its branches. No abdominal, pelvic, or inguinal lymphadenopathy. Reproductive: Status post hysterectomy. No adnexal masses. Other: No intraperitoneal free fluid. No intraperitoneal free gas. No organized fluid collection. Musculoskeletal: No abdominal wall hernia or abnormality. No suspicious lytic or blastic osseous lesions. No acute displaced fracture. Multilevel degenerative changes of the spine. IMPRESSION: 1. Persistent proctocolitis involving the rectosigmoid colon. Recommend colonoscopy status post treatment and status post complete resolution of inflammatory changes to exclude an underlying lesion. 2. Interval decrease in trace bilateral pleural effusions, right greater than left. 3. Partially visualized at least trace to small volume pericardial effusion. 4. Mild hepatomegaly. Electronically Signed   By: MIven FinnM.D.   On: 01/15/2022 23:30    Impression: *Crohn's colitis/proctitis *Rectal bleeding *Diarrhea-improved *Recent C. difficile infection  Plan: Patient presenting back with rectal bleeding in the setting of Crohn's colitis/proctitis  and recent C. difficile infection currently on active vancomycin taper.  Continue on vancomycin 500 mg QID x2 more days then decrease to 500 mg BID.  We will add mesalamine enema nightly to help with inflammation and symptoms.  Prednisone 40 mg daily (continued taper)  She will need to follow-up with 1 tertiary medical care center.  This has been discussed previous with her in depth.  Most reasonable option is [facility] with Dr. Nyoka Cowden as she is already established with him for potential Stelara versus surgical management given her stricturing disease.  He has an appointment on  01/21/2022.  Otherwise supportive care.  Elon Alas. Abbey Chatters, D.O. Gastroenterology and Hepatology [facility]    LOS: 0 days     01/16/2022, 11:33 AM

## 2022-01-16 NOTE — Assessment & Plan Note (Addendum)
-   History of Crohn's disease - Blood seen in stool - Also history of C. difficile with oral vancomycin on her home med list - Trend CBC - Patient was already typed and screened - FOBT positive - Consulted GI --> appreciate recs - Continue to monitor

## 2022-01-16 NOTE — H&P (Signed)
History and Physical    Patient: [name] DQQ:[id number] DOB: 10/21/1970 DOA: 01/15/2022 DOS: the patient was seen and examined on 01/16/2022 PCP: Teodoro Kil, PA-C  Patient coming from: Home  Chief Complaint:  Chief Complaint  Patient presents with   Rectal Bleeding   HPI: [name] is a 51 y.o. female with medical history significant of COPD, anxiety, GERD, Crohn's disease, hypertension, IBS, type 2 diabetes mellitus, DVT, and more presents ED with a chief complaint of blood in stool.  Patient has seen several GI doctors for Crohn's and has required steroids several times.  She had a reported to ED staff that she was being referred for colostomy.  Patient has noticed blood in her stool for the last couple days.  She did not think much of it and then today she had clots in her stool.  She had some dizziness associated.  She denies severe abdominal pain and vomiting to the ED provider.  Unfortunately the time of my exam patient has slurred speech and cannot stay awake to answer questions due to recent fentanyl administration for pain control.  What we did manage to cover was that the patient had dizziness at home, and that she is in pain.  It is hard to imagine that she is in pain when he cannot stay awake.  No further history could be obtained at this time. Review of Systems: As mentioned in the history of present illness. All other systems reviewed and are negative. Past Medical History:  Diagnosis Date   Anxiety    Asthma    Chronic low back pain    Collagen vascular disease (HCC)    COPD (chronic obstructive pulmonary disease) (HCC)    Crohn's disease (HCC)    GERD (gastroesophageal reflux disease)    EGD 01/2007 by Dr.Rourke small hiatal hernia s/p 56 french maloney    History of head injury    Hypertension    IBS (irritable bowel syndrome)    PSVT (paroxysmal supraventricular tachycardia) (HCC)    PTSD (post-traumatic stress disorder)    Recurrent chest pain     Seizure disorder ([place])    Type 2 diabetes mellitus ([place])    Past Surgical History:  Procedure Laterality Date   ABDOMINAL HYSTERECTOMY     with right salpingo oophorectomy 2005   APPENDECTOMY  2006   BALLOON DILATION N/A 02/19/2021   Procedure: BALLOON DILATION;  Surgeon: Eloise Harman, DO;  [facility];  Service: Endoscopy;  Laterality: N/A;  Sigmoid colon stricture   BIOPSY  02/06/2021   Procedure: BIOPSY;  Surgeon: Eloise Harman, DO;  [facility];  Service: Endoscopy;;   BIOPSY  02/19/2021   Procedure: BIOPSY;  Surgeon: Eloise Harman, DO;  [facility];  Service: Endoscopy;;   BIOPSY  07/15/2021   Procedure: BIOPSY;  Surgeon: Rogene Houston, MD;  [facility];  Service: Endoscopy;;   CESAREAN SECTION     1996   CHOLECYSTECTOMY     COLONOSCOPY  2010   Dr. Gala Romney; negative except for hemorrhoids   ESOPHAGEAL DILATION N/A 10/25/2014   Procedure: ESOPHAGEAL DILATION;  Surgeon: Rogene Houston, MD;  [facility];  Service: Endoscopy;  Laterality: N/A;   ESOPHAGOGASTRODUODENOSCOPY N/A 10/25/2014   Procedure: ESOPHAGOGASTRODUODENOSCOPY (EGD);  Surgeon: Rogene Houston, MD;  [facility];  Service: Endoscopy;  Laterality: N/A;  [place] N/A 02/06/2021   Procedure: FLEXIBLE SIGMOIDOSCOPY;  Surgeon: Eloise Harman,  DO;  [facility];  Service: Endoscopy;  Laterality: N/A;   FLEXIBLE SIGMOIDOSCOPY N/A 02/19/2021   Procedure: FLEXIBLE SIGMOIDOSCOPY;  Surgeon: Eloise Harman, DO;  [facility];  Service: Endoscopy;  Laterality: N/A;   OOPHORECTOMY     left for torsion and ovarian fibroma; uterine myoma resected 1995   SIGMOIDOSCOPY  07/15/2021   Procedure: SIGMOIDOSCOPY;  Surgeon: Rogene Houston, MD;  [facility];  Service: Endoscopy;;   Social History:  reports that she has been smoking cigarettes. She started smoking about 37 years ago. She has a 10.00  pack-year smoking history. She has been exposed to tobacco smoke. She has never used smokeless tobacco. She reports that she does not currently use drugs. She reports that she does not drink alcohol.  Allergies  Allergen Reactions   Codeine Shortness Of Breath, Swelling and Rash    Throat swelling   Dilaudid [Hydromorphone Hcl] Shortness Of Breath and Swelling   Hydrocodone Shortness Of Breath, Swelling and Rash   Morphine And Related Shortness Of Breath, Swelling and Rash   Oxycodone Shortness Of Breath, Swelling and Rash    Patient states ALL pain medications make her deathly sick.   Penicillins Shortness Of Breath, Swelling and Other (See Comments)    Has patient had a PCN reaction causing immediate rash, facial/tongue/throat swelling, SOB or lightheadedness with hypotension: Yes Has patient had a PCN reaction causing severe rash involving mucus membranes or skin necrosis: Yes Has patient had a PCN reaction that required hospitalization Yes Has patient had a PCN reaction occurring within the last 10 years: No If all of the above answers are "NO", then may proceed with Cephalosporin use.   Throat swells  02/06/21--TOLERATES CEFTRIAXONE     Acetaminophen     Per MD patient states she can't take Tylenol because of liver enzymes   Ativan [Lorazepam] Other (See Comments)    Migraines.   Humira [Adalimumab]     rash   Strawberry Extract Swelling   Watermelon Concentrate Swelling   Albuterol Palpitations   Benadryl [Diphenhydramine Hcl] Palpitations   Latex Rash   Remicade [Infliximab] Rash    Blisters and Welts    Tramadol Rash    Rash and itching    Family History  Problem Relation Age of Onset   Cancer Mother    Heart failure Mother    Hypertension Mother    Heart attack Mother    Heart attack Father 25   Cancer Father 84   Heart failure Father    Diabetes Father    Crohn's disease Cousin     Prior to Admission medications   Medication Sig Start Date End Date  Taking? Authorizing Provider  acetaminophen (TYLENOL) 325 MG tablet Take 2 tablets (650 mg total) by mouth every 6 (six) hours as needed for mild pain or headache (or Fever >/= 101). 01/03/22   [name], MD  albuterol (VENTOLIN HFA) 108 (90 Base) MCG/ACT inhaler Inhale 2 puffs into the lungs every 4 (four) hours as needed for wheezing or shortness of breath. 01/24/21   Roxan Hockey, MD  ALPRAZolam [name]) 1 MG tablet Take 1 mg by mouth 3 (three) times daily as needed for anxiety. 07/11/19   [provider]  amitriptyline (ELAVIL) 75 MG tablet Take 75 mg by mouth at bedtime. 12/16/20   [provider]  apixaban (ELIQUIS) 5 MG TABS tablet Take 2 tablets by mouth twice a day for 4 days; then 1 tablet by mouth  twice a day daily. 01/06/22   [name], MD  cyclobenzaprine (FLEXERIL) 10 MG tablet Take 10 mg by mouth 3 (three) times daily as needed for muscle spasms. 07/11/19   [provider]  DULoxetine (CYMBALTA) 30 MG capsule Take 30 mg by mouth daily. 12/17/21   [provider]  feeding supplement (ENSURE ENLIVE / ENSURE PLUS) LIQD Take 237 mLs by mouth 2 (two) times daily between meals. 01/04/22   [name], MD  folic acid (FOLVITE) 1 MG tablet Take 1 tablet (1 mg total) by mouth daily. 10/03/21   Orson Eva, MD  hydrOXYzine (ATARAX) 25 MG tablet Take 25 mg by mouth 3 (three) times daily as needed for anxiety. 12/13/21   [provider]  levETIRAcetam (KEPPRA) 750 MG tablet Take 750 mg by mouth 2 (two) times daily. 09/30/20   [provider]  nitroGLYCERIN (NITROSTAT) 0.4 MG SL tablet Place 1 tablet (0.4 mg total) under the tongue every 5 (five) minutes as needed for chest pain. 03/19/21   Rehman, Mechele Dawley, MD  ondansetron (ZOFRAN-ODT) 4 MG disintegrating tablet Take 4 mg by mouth every 8 (eight) hours as needed for nausea or vomiting. 09/07/21   [provider]  pantoprazole (PROTONIX) 40 MG tablet Take 1 tablet (40 mg total) by mouth 2  (two) times daily. 01/03/22   [name], MD  predniSONE (DELTASONE) 10 MG tablet Prednisone 60 mg daily for 1 week, prednisone 50 mg daily for 1 week, prednisone 40 mg daily for 1 week and then decrease by 5 mg weekly. 01/03/22   [name], MD  RESTASIS 0.05 % ophthalmic emulsion Place 1 drop into both eyes 2 (two) times daily. 12/12/20   [provider]  SPIRIVA RESPIMAT 2.5 MCG/ACT AERS Inhale 1 puff into the lungs daily. 01/03/22   [name], MD  triamcinolone cream (KENALOG) 0.1 % Apply topically 2 (two) times daily. 10/03/21   Orson Eva, MD  vancomycin (VANCOCIN) 250 MG capsule vancomycin taper: 500 mg orally 4 times daily for 14 days, then 500 mg orally 2 times daily for 7 days, then 500 mg orally once daily for 7 days, then 500 mg orally every other day for 4 weeks. 01/03/22   [name], MD  Vitamin D, Ergocalciferol, (DRISDOL) 1.25 MG (50000 UNIT) CAPS capsule Take 50,000 Units by mouth once a week. 12/14/21   [provider]    Physical Exam: Vitals:   01/16/22 0030 01/16/22 0114 01/16/22 0451 01/16/22 0535  BP: 114/71 (!) 103/59 (!) 113/98   Pulse: 94 (!) 110 (!) 104   Resp: 14 17 17    Temp: 98 F (36.7 C) 98 F (36.7 C) 97.7 F (36.5 C)   TempSrc: Oral     SpO2: 100% 100% 96%   Weight:    57.3 kg  Height:       1.  General: Patient lying supine in bed,  no acute distress   2. Psychiatric: Somnolent and oriented x 3, mood and behavior normal for situation, pleasant and cooperative with exam   3. Neurologic: Speech is slurred and language is normal, face is symmetric, moves all 4 extremities voluntarily, almost at baseline aside from slurred speech due to opiate   4. HEENMT:  Head is atraumatic, normocephalic, pupils reactive to light, neck is supple, trachea is midline, mucous membranes are moist   5. Respiratory : Lungs are clear to auscultation bilaterally without wheezing, rhonchi, rales, no cyanosis, no increase in work of breathing or  accessory muscle  use   6. Cardiovascular : Heart rate normal, rhythm is regular, no murmurs, rubs or gallops, no peripheral edema, peripheral pulses palpated   7. Gastrointestinal:  Abdomen is soft, nondistended, tender to palpation especially in the right upper and lower quadrants with minimal guarding, bowel sounds active, no masses or organomegaly palpated   8. Skin:  Skin is warm, dry and intact without rashes, acute lesions, or ulcers on limited exam   9.Musculoskeletal:  No acute deformities or trauma, no asymmetry in tone, no peripheral edema, peripheral pulses palpated, no tenderness to palpation in the extremities  Data Reviewed: In the ED Temp 98, heart rate 99-101, respiratory rate 16-19, blood pressure 110/51-115/63, satting 98% No leukocytosis at 8.0, hemoglobin 9.8, platelets 330 Chemistry is unremarkable FOBT positive CT abdomen pelvis shows proctocolitis involving the rectosigmoid colon Cipro Flagyl started in the ED Fentanyl 50 mg given in the ED Admission requested for further work-up of proctocolitis and GI bleed  Assessment and Plan: * GI bleed - History of Crohn's disease - Blood seen in stool - Also history of C. difficile with oral vancomycin on her home med list - Trend CBC - Patient was already typed and screened - FOBT positive - Consult GI - Continue to monitor  Proctocolitis - History of Crohn's - Continue Cipro Flagyl - Continue pain control - CT abdomen shows proctocolitis involving the rectosigmoid colon  Acute metabolic encephalopathy - Patient just given fentanyl prior to my exam - Slurred speech, cannot stay awake to answer questions, unfortunately not able to provide history - Likely to improve as the fentanyl wears off as she was alert and able to provide history earlier  Acute deep vein thrombosis (DVT) of brachial vein of right upper extremity (HCC) - Holding Eliquis in the setting of GI bleed  Anxiety - Continue Atarax and  Xanax  COPD (chronic obstructive pulmonary disease) (HCC) - Continue Spiriva, albuterol  GERD (gastroesophageal reflux disease) - Given GI bleed will do IV Protonix twice daily      Advance Care Planning:   Code Status: Full Code   Consults: Gastro  Family Communication: No family at bedside  Severity of Illness: The appropriate patient status for this patient is OBSERVATION. Observation status is judged to be reasonable and necessary in order to provide the required intensity of service to ensure the patient's safety. The patient's presenting symptoms, physical exam findings, and initial radiographic and laboratory data in the context of their medical condition is felt to place them at decreased risk for further clinical deterioration. Furthermore, it is anticipated that the patient will be medically stable for discharge from the hospital within 2 midnights of admission.   Author: Rolla Plate, DO 01/16/2022 6:31 AM  For on call review [email or web address].

## 2022-01-16 NOTE — Assessment & Plan Note (Signed)
-   Continue Spiriva, albuterol

## 2022-01-16 NOTE — Progress Notes (Signed)
Patient incruse placed in room

## 2022-01-16 NOTE — Assessment & Plan Note (Signed)
-   Patient just given fentanyl prior to my exam - Slurred speech, cannot stay awake to answer questions, unfortunately not able to provide history - Likely to improve as the fentanyl wears off as she was alert and able to provide history earlier

## 2022-01-16 NOTE — Progress Notes (Signed)
ASSUMPTION OF CARE NOTE   01/16/2022 12:10 PM  [name] was seen and examined.  The H&P by the admitting provider, orders, imaging was reviewed.  Please see new orders.  Will continue to follow.   Vitals:   01/16/22 0451 01/16/22 0825  BP: (!) 113/98   Pulse: (!) 104   Resp: 17   Temp: 97.7 F (36.5 C)   SpO2: 96% 97%    Results for orders placed or performed during the hospital encounter of 01/15/22  CBC with Differential  Result Value Ref Range   WBC 8.0 4.0 - 10.5 K/uL   RBC 3.14 (L) 3.87 - 5.11 MIL/uL   Hemoglobin 9.8 (L) 12.0 - 15.0 g/dL   HCT 31.0 (L) 36.0 - 46.0 %   MCV 98.7 80.0 - 100.0 fL   MCH 31.2 26.0 - 34.0 pg   MCHC 31.6 30.0 - 36.0 g/dL   RDW 15.0 11.5 - 15.5 %   Platelets 330 150 - 400 K/uL   nRBC 0.0 0.0 - 0.2 %   Neutrophils Relative % 75 %   Neutro Abs 6.0 1.7 - 7.7 K/uL   Lymphocytes Relative 19 %   Lymphs Abs 1.5 0.7 - 4.0 K/uL   Monocytes Relative 4 %   Monocytes Absolute 0.3 0.1 - 1.0 K/uL   Eosinophils Relative 0 %   Eosinophils Absolute 0.0 0.0 - 0.5 K/uL   Basophils Relative 0 %   Basophils Absolute 0.0 0.0 - 0.1 K/uL   Immature Granulocytes 2 %   Abs Immature Granulocytes 0.16 (H) 0.00 - 0.07 K/uL  Basic metabolic panel  Result Value Ref Range   Sodium 137 135 - 145 mmol/L   Potassium 4.2 3.5 - 5.1 mmol/L   Chloride 102 98 - 111 mmol/L   CO2 27 22 - 32 mmol/L   Glucose, Bld 143 (H) 70 - 99 mg/dL   BUN 11 6 - 20 mg/dL   Creatinine, Ser 0.67 0.44 - 1.00 mg/dL   Calcium 8.7 (L) 8.9 - 10.3 mg/dL   GFR, Estimated >60 >60 mL/min   Anion gap 8 5 - 15  Comprehensive metabolic panel  Result Value Ref Range   Sodium 138 135 - 145 mmol/L   Potassium 3.7 3.5 - 5.1 mmol/L   Chloride 104 98 - 111 mmol/L   CO2 29 22 - 32 mmol/L   Glucose, Bld 100 (H) 70 - 99 mg/dL   BUN 11 6 - 20 mg/dL   Creatinine, Ser 0.59 0.44 - 1.00 mg/dL   Calcium 8.7 (L) 8.9 - 10.3 mg/dL   Total Protein 4.8 (L) 6.5 - 8.1 g/dL   Albumin 2.3 (L) 3.5 - 5.0 g/dL    AST 10 (L) 15 - 41 U/L   ALT 13 0 - 44 U/L   Alkaline Phosphatase 56 38 - 126 U/L   Total Bilirubin 0.4 0.3 - 1.2 mg/dL   GFR, Estimated >60 >60 mL/min   Anion gap 5 5 - 15  Magnesium  Result Value Ref Range   Magnesium 2.2 1.7 - 2.4 mg/dL  CBC with Differential/Platelet  Result Value Ref Range   WBC 7.0 4.0 - 10.5 K/uL   RBC 3.10 (L) 3.87 - 5.11 MIL/uL   Hemoglobin 9.8 (L) 12.0 - 15.0 g/dL   HCT 30.7 (L) 36.0 - 46.0 %   MCV 99.0 80.0 - 100.0 fL   MCH 31.6 26.0 - 34.0 pg   MCHC 31.9 30.0 - 36.0 g/dL   RDW 14.8 11.5 - 15.5 %  Platelets 281 150 - 400 K/uL   nRBC 0.0 0.0 - 0.2 %   Neutrophils Relative % 56 %   Neutro Abs 4.0 1.7 - 7.7 K/uL   Lymphocytes Relative 32 %   Lymphs Abs 2.2 0.7 - 4.0 K/uL   Monocytes Relative 8 %   Monocytes Absolute 0.5 0.1 - 1.0 K/uL   Eosinophils Relative 2 %   Eosinophils Absolute 0.1 0.0 - 0.5 K/uL   Basophils Relative 0 %   Basophils Absolute 0.0 0.0 - 0.1 K/uL   Immature Granulocytes 2 %   Abs Immature Granulocytes 0.14 (H) 0.00 - 0.07 K/uL  POC occult blood, ED RN will collect  Result Value Ref Range   Fecal Occult Bld POSITIVE (A) Lovenia Kim, MD [facility]   01/15/2022  8:41 PM How to contact the [facility] Attending or Consulting provider [place] or covering provider during after hours [place], for this patient?  Check the care team in [facility] and look for a) attending/consulting [facility] provider listed and b) the [facility] team listed Log into [email or web address] and use [place]'s universal password to access. If you do not have the password, please contact the hospital operator. Locate the [facility] provider you are looking for under [facility] and page to a number that you can be directly reached. If you still have difficulty reaching the provider, please page the [facility] (Director on Call) for the Hospitalists listed on amion for assistance.

## 2022-01-17 DIAGNOSIS — I82621 Acute embolism and thrombosis of deep veins of right upper extremity: Secondary | ICD-10-CM | POA: Diagnosis not present

## 2022-01-17 DIAGNOSIS — K50118 Crohn's disease of large intestine with other complication: Secondary | ICD-10-CM | POA: Diagnosis not present

## 2022-01-17 DIAGNOSIS — K922 Gastrointestinal hemorrhage, unspecified: Secondary | ICD-10-CM | POA: Diagnosis not present

## 2022-01-17 DIAGNOSIS — A0472 Enterocolitis due to Clostridium difficile, not specified as recurrent: Secondary | ICD-10-CM | POA: Diagnosis not present

## 2022-01-17 DIAGNOSIS — G9341 Metabolic encephalopathy: Secondary | ICD-10-CM | POA: Diagnosis not present

## 2022-01-17 DIAGNOSIS — K219 Gastro-esophageal reflux disease without esophagitis: Secondary | ICD-10-CM | POA: Diagnosis not present

## 2022-01-17 MED ORDER — FENTANYL CITRATE PF 50 MCG/ML IJ SOSY
25.0000 ug | PREFILLED_SYRINGE | INTRAMUSCULAR | Status: DC | PRN
  Administered 2022-01-17 – 2022-01-18 (×7): 25 ug via INTRAVENOUS
  Filled 2022-01-17 (×7): qty 1

## 2022-01-17 NOTE — Assessment & Plan Note (Signed)
--   pt has follow up with tertiary care center and has appt on 01/21/22

## 2022-01-17 NOTE — Hospital Course (Signed)
51 y.o. female with medical history significant of COPD, anxiety, GERD, Crohn's disease, hypertension, IBS, type 2 diabetes mellitus, DVT, and more presents ED with a chief complaint of blood in stool.  Patient has seen several GI doctors for Crohn's and has required steroids several times.  She had a reported to ED staff that she was being referred for colostomy.  Patient has noticed blood in her stool for the last couple days.  She did not think much of it and then today she had clots in her stool.  She had some dizziness associated.  She denies severe abdominal pain and vomiting to the ED provider.  Unfortunately the time of my exam patient has slurred speech and cannot stay awake to answer questions due to recent fentanyl administration for pain control.  What we did manage to cover was that the patient had dizziness at home, and that she is in pain.  It is hard to imagine that she is in pain when he cannot stay awake.  No further history could be obtained at this time.

## 2022-01-17 NOTE — Progress Notes (Signed)
Subjective: Patient notes improvement today.  Wishes to advance diet.  Bleeding has improved.  Continues to complain of abdominal discomfort.  States she tolerated mesalamine enema last night.  Objective: Vital signs in last 24 hours: Temp:  [97.3 F (36.3 C)-98.4 F (36.9 C)] 97.8 F (36.6 C) (09/16 2111) Pulse Rate:  [95-109] 98 (09/16 2111) Resp:  [16-18] 18 (09/16 2111) BP: (94-120)/(47-81) 118/81 (09/16 2111) SpO2:  [94 %-98 %] 95 % (09/17 0957) Last BM Date : 01/16/22 General:   Alert and oriented, pleasant Head:  Normocephalic and atraumatic. Eyes:  No icterus, sclera clear. Conjuctiva pink.  Abdomen:  Bowel sounds present, soft, non-tender, non-distended. No HSM or hernias noted. No rebound or guarding. No masses appreciated  Msk:  Symmetrical without gross deformities. Normal posture. Extremities:  Without clubbing or edema. Neurologic:  Alert and  oriented x4;  grossly normal neurologically. Skin:  Warm and dry, intact without significant lesions.  Cervical Nodes:  No significant cervical adenopathy. Psych:  Alert and cooperative. Normal mood and affect.  Intake/Output from previous day: 09/16 0701 - 09/17 0700 In: 3107.5 [P.O.:1480; I.V.:1505.7; IV Piggyback:121.8] Out: -  Intake/Output this shift: Total I/O In: 891.2 [I.V.:891.2] Out: -   Lab Results: Recent Labs    01/15/22 2050 01/16/22 0546  WBC 8.0 7.0  HGB 9.8* 9.8*  HCT 31.0* 30.7*  PLT 330 281   BMET Recent Labs    01/15/22 2050 01/16/22 0546  NA 137 138  K 4.2 3.7  CL 102 104  CO2 27 29  GLUCOSE 143* 100*  BUN 11 11  CREATININE 0.67 0.59  CALCIUM 8.7* 8.7*   LFT Recent Labs    01/16/22 0546  PROT 4.8*  ALBUMIN 2.3*  AST 10*  ALT 13  ALKPHOS 56  BILITOT 0.4   PT/INR No results for input(s): "LABPROT", "INR" in the last 72 hours. Hepatitis Panel No results for input(s): "HEPBSAG", "HCVAB", "HEPAIGM", "HEPBIGM" in the last 72 hours.   Studies/Results: CT ABDOMEN PELVIS W  CONTRAST  Result Date: 01/15/2022 CLINICAL DATA:  Crohn's exacerbation EXAM: CT ABDOMEN AND PELVIS WITH CONTRAST TECHNIQUE: Multidetector CT imaging of the abdomen and pelvis was performed using the standard protocol following bolus administration of intravenous contrast. RADIATION DOSE REDUCTION: This exam was performed according to the departmental dose-optimization program which includes automated exposure control, adjustment of the mA and/or kV according to patient size and/or use of iterative reconstruction technique. CONTRAST:  168m OMNIPAQUE IOHEXOL 350 MG/ML SOLN COMPARISON:  CT abdomen pelvis 04/30/2021, CT abdomen pelvis 12/23/2021, cm pelvis 02/03/2021, 7 pelvis 09/14/2015 FINDINGS: Lower chest: Interval decrease in trace bilateral pleural effusions, right greater than left. No acute abnormality. Persistent partially visualized at least trace to small volume pericardial effusion. Hepatobiliary: Spleen is mildly enlarged measuring up to 18.5 cm. No focal liver abnormality. Status post cholecystectomy. No biliary dilatation. Pancreas: No focal lesion. Normal pancreatic contour. No surrounding inflammatory changes. No main pancreatic ductal dilatation. Spleen: Normal in size without focal abnormality. Adrenals/Urinary Tract: No adrenal nodule bilaterally. Bilateral kidneys enhance symmetrically. No hydronephrosis. No hydroureter. The urinary bladder is unremarkable. On delayed imaging, there is no urothelial wall thickening and there are no filling defects in the opacified portions of the bilateral collecting systems or ureters. Stomach/Bowel: Stomach is within normal limits. Bowel wall thickening and mucosal hyperemia of the rectum and distal sigmoid colon. Associated mild pericolonic fat stranding. Mild presacral soft tissue edema. No evidence of small bowel wall thickening or dilatation. Stool noted throughout the ascending, transverse,  descending colon. The appendix is not definitely identified with  no inflammatory changes in the right lower quadrant to suggest acute appendicitis. Vascular/Lymphatic: No abdominal aorta or iliac aneurysm. Mild atherosclerotic plaque of the aorta and its branches. No abdominal, pelvic, or inguinal lymphadenopathy. Reproductive: Status post hysterectomy. No adnexal masses. Other: No intraperitoneal free fluid. No intraperitoneal free gas. No organized fluid collection. Musculoskeletal: No abdominal wall hernia or abnormality. No suspicious lytic or blastic osseous lesions. No acute displaced fracture. Multilevel degenerative changes of the spine. IMPRESSION: 1. Persistent proctocolitis involving the rectosigmoid colon. Recommend colonoscopy status post treatment and status post complete resolution of inflammatory changes to exclude an underlying lesion. 2. Interval decrease in trace bilateral pleural effusions, right greater than left. 3. Partially visualized at least trace to small volume pericardial effusion. 4. Mild hepatomegaly. Electronically Signed   By: Iven Finn M.D.   On: 01/15/2022 23:30    Impression: *Crohn's colitis/proctitis *Rectal bleeding *Diarrhea-improved *Recent C. difficile infection   Plan: Patient presenting back with rectal bleeding in the setting of Crohn's colitis/proctitis and recent C. difficile infection currently on active vancomycin taper.   Continue on vancomycin 500 mg QID x1 more day then decrease to 500 mg BID.    Continue on mesalamine enemas nightly for inflammation and symptom relief.   Prednisone 40 mg daily (continued taper)   She will need to follow-up with 1 tertiary medical care center.  This has been discussed previous with her in depth.  Most reasonable option is [facility] with Dr. Nyoka Cowden as she is already established with him for potential Stelara versus surgical management given her stricturing disease.  He has an appointment on 01/21/2022.   Otherwise supportive care.   Elon Alas. Abbey Chatters,  D.O. Gastroenterology and Hepatology [facility]   LOS: 1 day    01/17/2022, 11:42 AM

## 2022-01-17 NOTE — Assessment & Plan Note (Signed)
--   pt is completing vancomycin taper

## 2022-01-17 NOTE — Progress Notes (Signed)
PROGRESS NOTE   [name]  CBJ:[id number] DOB: May 27, 1970 DOA: 01/15/2022 PCP: Teodoro Kil, PA-C   Chief Complaint  Patient presents with   Rectal Bleeding   Level of care: Med-Surg  Brief Admission History:  51 y.o. female with medical history significant of COPD, anxiety, GERD, Crohn's disease, hypertension, IBS, type 2 diabetes mellitus, DVT, and more presents ED with a chief complaint of blood in stool.  Patient has seen several GI doctors for Crohn's and has required steroids several times.  She had a reported to ED staff that she was being referred for colostomy.  Patient has noticed blood in her stool for the last couple days.  She did not think much of it and then today she had clots in her stool.  She had some dizziness associated.  She denies severe abdominal pain and vomiting to the ED provider.  Unfortunately the time of my exam patient has slurred speech and cannot stay awake to answer questions due to recent fentanyl administration for pain control.  What we did manage to cover was that the patient had dizziness at home, and that she is in pain.  It is hard to imagine that she is in pain when he cannot stay awake.  No further history could be obtained at this time.    Assessment and Plan: * GI bleed - History of Crohn's disease - Blood seen in stool - Also history of C. difficile with oral vancomycin on her home med list - Trend CBC - Patient was already typed and screened - FOBT positive - Consulted GI --> appreciate recs - Continue to monitor  COPD (chronic obstructive pulmonary disease) (HCC) - Continue Spiriva, albuterol  GERD (gastroesophageal reflux disease) - Given GI bleed will do IV Protonix twice daily  Acute deep vein thrombosis (DVT) of brachial vein of right upper extremity (HCC) - Holding apixaban temporarily in the setting of GI bleed  Enteritis due to Clostridium difficile -- pt is completing vancomycin taper   Proctocolitis - History of  Crohn's - Continue vancomycin taper per GI  - Continue pain control and oral steroid taper prednisone 40 mg - CT abdomen shows proctocolitis involving the rectosigmoid colon  Crohn's disease of large intestine with other complication ([place]) -- pt has follow up with tertiary care center and has appt on 1/60/73  Acute metabolic encephalopathy - Patient just given fentanyl prior to my exam - Slurred speech, cannot stay awake to answer questions, unfortunately not able to provide history - Likely to improve as the fentanyl wears off as she was alert and able to provide history earlier  Anxiety - Continue Atarax and Xanax  DVT prophylaxis: SCDs Code Status: Full  Family Communication:  Disposition: Status is: Inpatient Remains inpatient appropriate because: IV fluids    Consultants:  GI  Procedures:   Antimicrobials:    Subjective: Pt reports she is starting to feel a little better today.   Objective: Vitals:   01/16/22 1649 01/16/22 1812 01/16/22 2111 01/17/22 0957  BP: (!) 94/55 (!) 120/52 118/81   Pulse: 95  98   Resp: 18  18   Temp: 98.4 F (36.9 C)  97.8 F (36.6 C)   TempSrc: Oral  Oral   SpO2: 94%  97% 95%  Weight:      Height:        Intake/Output Summary (Last 24 hours) at 01/17/2022 1219 Last data filed at 01/17/2022 0938 Gross per 24 hour  Intake 3082.7 ml  Output --  Net  3082.7 ml   Filed Weights   01/15/22 2104 01/16/22 0535  Weight: 61.2 kg 57.3 kg   Examination:  General exam: Appears calm and comfortable  Respiratory system: Clear to auscultation. Respiratory effort normal. Cardiovascular system: normal S1 & S2 heard. No JVD, murmurs, rubs, gallops or clicks. No pedal edema. Gastrointestinal system: Abdomen is nondistended, soft and nontender. No organomegaly or masses felt. Normal bowel sounds heard. Central nervous system: Alert and oriented. No focal neurological deficits. Extremities: Symmetric 5 x 5 power. Skin: No rashes, lesions or  ulcers. Psychiatry: Judgement and insight appear normal. Mood & affect appropriate.   Data Reviewed: I have personally reviewed following labs and imaging studies  CBC: Recent Labs  Lab 01/13/22 1057 01/15/22 2050 01/16/22 0546  WBC 7.4 8.0 7.0  NEUTROABS  --  6.0 4.0  HGB 10.1* 9.8* 9.8*  HCT 30.4* 31.0* 30.7*  MCV 96 98.7 99.0  PLT 389 330 761    Basic Metabolic Panel: Recent Labs  Lab 01/15/22 2050 01/16/22 0546  NA 137 138  K 4.2 3.7  CL 102 104  CO2 27 29  GLUCOSE 143* 100*  BUN 11 11  CREATININE 0.67 0.59  CALCIUM 8.7* 8.7*  MG  --  2.2    CBG: No results for input(s): "GLUCAP" in the last 168 hours.  No results found for this or any previous visit (from the past 240 hour(s)).   Radiology Studies: CT ABDOMEN PELVIS W CONTRAST  Result Date: 01/15/2022 CLINICAL DATA:  Crohn's exacerbation EXAM: CT ABDOMEN AND PELVIS WITH CONTRAST TECHNIQUE: Multidetector CT imaging of the abdomen and pelvis was performed using the standard protocol following bolus administration of intravenous contrast. RADIATION DOSE REDUCTION: This exam was performed according to the departmental dose-optimization program which includes automated exposure control, adjustment of the mA and/or kV according to patient size and/or use of iterative reconstruction technique. CONTRAST:  175m OMNIPAQUE IOHEXOL 350 MG/ML SOLN COMPARISON:  CT abdomen pelvis 04/30/2021, CT abdomen pelvis 12/23/2021, cm pelvis 02/03/2021, 7 pelvis 09/14/2015 FINDINGS: Lower chest: Interval decrease in trace bilateral pleural effusions, right greater than left. No acute abnormality. Persistent partially visualized at least trace to small volume pericardial effusion. Hepatobiliary: Spleen is mildly enlarged measuring up to 18.5 cm. No focal liver abnormality. Status post cholecystectomy. No biliary dilatation. Pancreas: No focal lesion. Normal pancreatic contour. No surrounding inflammatory changes. No main pancreatic ductal  dilatation. Spleen: Normal in size without focal abnormality. Adrenals/Urinary Tract: No adrenal nodule bilaterally. Bilateral kidneys enhance symmetrically. No hydronephrosis. No hydroureter. The urinary bladder is unremarkable. On delayed imaging, there is no urothelial wall thickening and there are no filling defects in the opacified portions of the bilateral collecting systems or ureters. Stomach/Bowel: Stomach is within normal limits. Bowel wall thickening and mucosal hyperemia of the rectum and distal sigmoid colon. Associated mild pericolonic fat stranding. Mild presacral soft tissue edema. No evidence of small bowel wall thickening or dilatation. Stool noted throughout the ascending, transverse, descending colon. The appendix is not definitely identified with no inflammatory changes in the right lower quadrant to suggest acute appendicitis. Vascular/Lymphatic: No abdominal aorta or iliac aneurysm. Mild atherosclerotic plaque of the aorta and its branches. No abdominal, pelvic, or inguinal lymphadenopathy. Reproductive: Status post hysterectomy. No adnexal masses. Other: No intraperitoneal free fluid. No intraperitoneal free gas. No organized fluid collection. Musculoskeletal: No abdominal wall hernia or abnormality. No suspicious lytic or blastic osseous lesions. No acute displaced fracture. Multilevel degenerative changes of the spine. IMPRESSION: 1. Persistent proctocolitis involving the  rectosigmoid colon. Recommend colonoscopy status post treatment and status post complete resolution of inflammatory changes to exclude an underlying lesion. 2. Interval decrease in trace bilateral pleural effusions, right greater than left. 3. Partially visualized at least trace to small volume pericardial effusion. 4. Mild hepatomegaly. Electronically Signed   By: Iven Finn M.D.   On: 01/15/2022 23:30    Scheduled Meds:  amitriptyline  75 mg Oral QHS   cycloSPORINE  1 drop Both Eyes BID   DULoxetine  30 mg Oral  Daily   feeding supplement  237 mL Oral BID BM   folic acid  1 mg Oral Daily   levETIRAcetam  750 mg Oral BID   loratadine  10 mg Oral Daily   mesalamine  4 g Rectal QHS   pantoprazole (PROTONIX) IV  40 mg Intravenous Q12H   predniSONE  40 mg Oral Q breakfast   umeclidinium bromide  1 puff Inhalation Daily   vancomycin  500 mg Oral QID   Followed by   Derrill Memo ON 01/18/2022] vancomycin  500 mg Oral BID   Followed by   Derrill Memo ON 01/26/2022] vancomycin  500 mg Oral Daily   Followed by   Derrill Memo ON 02/02/2022] vancomycin  500 mg Oral QODAY   Continuous Infusions:  sodium chloride 60 mL/hr at 01/17/22 0939     LOS: 1 day   Time spent: 35 mins  Dorthey Depace Wynetta Emery, MD How to contact the [facility] Attending or Consulting provider [place] or covering provider during after hours [place], for this patient?  Check the care team in [facility] and look for a) attending/consulting [facility] provider listed and b) the [facility] team listed Log into [email or web address] and use [facility]'s universal password to access. If you do not have the password, please contact the hospital operator. Locate the [facility] provider you are looking for under [facility] and page to a number that you can be directly reached. If you still have difficulty reaching the provider, please page the [facility] (Director on Call) for the Hospitalists listed on amion for assistance.  01/17/2022, 12:19 PM

## 2022-01-18 DIAGNOSIS — J449 Chronic obstructive pulmonary disease, unspecified: Secondary | ICD-10-CM | POA: Diagnosis not present

## 2022-01-18 DIAGNOSIS — K219 Gastro-esophageal reflux disease without esophagitis: Secondary | ICD-10-CM | POA: Diagnosis not present

## 2022-01-18 DIAGNOSIS — K50118 Crohn's disease of large intestine with other complication: Secondary | ICD-10-CM | POA: Diagnosis not present

## 2022-01-18 DIAGNOSIS — A0472 Enterocolitis due to Clostridium difficile, not specified as recurrent: Secondary | ICD-10-CM | POA: Diagnosis not present

## 2022-01-18 DIAGNOSIS — K529 Noninfective gastroenteritis and colitis, unspecified: Secondary | ICD-10-CM | POA: Diagnosis not present

## 2022-01-18 DIAGNOSIS — G9341 Metabolic encephalopathy: Secondary | ICD-10-CM | POA: Diagnosis not present

## 2022-01-18 DIAGNOSIS — K922 Gastrointestinal hemorrhage, unspecified: Secondary | ICD-10-CM | POA: Diagnosis not present

## 2022-01-18 NOTE — Discharge Instructions (Signed)
PLEASE DO NOT TAKE APIXABAN (ELIQUIS) BLOOD THINNER UNTIL YOU SEE DR. GREEN BECAUSE OF YOUR RECTAL BLEEDING  PLEASE GO SEE DR. GREEN ON 01/21/22 AS SCHEDULED AT [facility]      IMPORTANT INFORMATION: PAY CLOSE ATTENTION   PHYSICIAN DISCHARGE INSTRUCTIONS  Follow with Primary care provider  Teodoro Kil, PA-C  and other consultants as instructed by your Hospitalist Physician  [place] IF SYMPTOMS COME BACK, WORSEN OR NEW PROBLEM DEVELOPS   Please note: You were cared for by a hospitalist during your hospital stay. Every effort will be made to forward records to your primary care provider.  You can request that your primary care provider send for your hospital records if they have not received them.  Once you are discharged, your primary care physician will handle any further medical issues. Please note that NO REFILLS for any discharge medications will be authorized once you are discharged, as it is imperative that you return to your primary care physician (or establish a relationship with a primary care physician if you do not have one) for your post hospital discharge needs so that they can reassess your need for medications and monitor your lab values.  Please get a complete blood count and chemistry panel checked by your Primary MD at your next visit, and again as instructed by your Primary MD.  Get Medicines reviewed and adjusted: Please take all your medications with you for your next visit with your Primary MD  Laboratory/radiological data: Please request your Primary MD to go over all hospital tests and procedure/radiological results at the follow up, please ask your primary care provider to get all Hospital records sent to his/her office.  In some cases, they will be blood work, cultures and biopsy results pending at the time of your discharge. Please request that your primary care provider follow up on these results.  If you are  diabetic, please bring your blood sugar readings with you to your follow up appointment with primary care.    Please call and make your follow up appointments as soon as possible.    Also Note the following: If you experience worsening of your admission symptoms, develop shortness of breath, life threatening emergency, suicidal or homicidal thoughts you must seek medical attention immediately by calling 911 or calling your MD immediately  if symptoms less severe.  You must read complete instructions/literature along with all the possible adverse reactions/side effects for all the Medicines you take and that have been prescribed to you. Take any new Medicines after you have completely understood and accpet all the possible adverse reactions/side effects.   Do not drive when taking Pain medications or sleeping medications (Benzodiazepines)  Do not take more than prescribed Pain, Sleep and Anxiety Medications. It is not advisable to combine anxiety,sleep and pain medications without talking with your primary care practitioner  Special Instructions: If you have smoked or chewed Tobacco  in the last 2 yrs please stop smoking, stop any regular Alcohol  and or any Recreational drug use.  Wear Seat belts while driving.  Do not drive if taking any narcotic, mind altering or controlled substances or recreational drugs or alcohol.

## 2022-01-18 NOTE — Discharge Summary (Signed)
Physician Discharge Summary  Dionna Wiedemann MGN:[id number] DOB: 1970-07-09 DOA: 01/15/2022  PCP: Teodoro Kil, PA-C Tertiary GI: Dr.Patrick Green  Admit date: 01/15/2022 Discharge date: 01/18/2022  Admitted From:  Home  Disposition: Home   Recommendations for Outpatient Follow-up:  Follow up with Dr. Nyoka Cowden on 9/21 as scheduled.  Please hold apixaban until follow up with Dr. Nyoka Cowden Please CBC in 1-2 weeks to follow up Hg  Discharge Condition: STABLE   CODE STATUS: FULL DIET: resume prior home diet    Brief Hospitalization Summary: Please see all hospital notes, images, labs for full details of the hospitalization. Brief Admission History:  51 y.o. female with medical history significant of COPD, anxiety, GERD, Crohn's disease, hypertension, IBS, type 2 diabetes mellitus, DVT, and more presents ED with a chief complaint of blood in stool.  Patient has seen several GI doctors for Crohn's and has required steroids several times.  She had a reported to ED staff that she was being referred for colostomy.  Patient has noticed blood in her stool for the last couple days.  She did not think much of it and then today she had clots in her stool.  She had some dizziness associated.  She denies severe abdominal pain and vomiting to the ED provider.  Unfortunately the time of my exam patient has slurred speech and cannot stay awake to answer questions due to recent fentanyl administration for pain control.  What we did manage to cover was that the patient had dizziness at home, and that she is in pain.  It is hard to imagine that she is in pain when he cannot stay awake.  No further history could be obtained at this time.     Assessment and Plan: * GI bleed - History of Crohn's disease - Blood seen in stool - Also history of C. difficile with oral vancomycin on her home med list - Trend CBC - Patient was already typed and screened - FOBT positive - Consulted GI --> appreciate recs - hold apixaban  until follow up with Dr. Nyoka Cowden on 9/21 due to ongoing rectal bleeding   COPD (chronic obstructive pulmonary disease) ([place]) - Continue Spiriva, albuterol   GERD (gastroesophageal reflux disease) - continue Protonix twice daily   Acute deep vein thrombosis (DVT) of brachial vein of right upper extremity  - Holding apixaban temporarily in the setting of GI bleed   Enteritis due to Clostridium difficile -- pt is completing vancomycin taper per original orders    Proctocolitis - History of Crohn's - Continue vancomycin taper per GI  - Continue pain control and oral steroid taper prednisone 40 mg - CT abdomen shows proctocolitis involving the rectosigmoid colon   Crohn's disease of large intestine with other complication ([place]) -- pt has follow up with tertiary care center and has appt on 01/16/93   Acute metabolic encephalopathy - Patient just given fentanyl prior to my exam - Slurred speech, cannot stay awake to answer questions, unfortunately not able to provide history - Likely to improve as the fentanyl wears off as she was alert and able to provide history earlier   Anxiety - Continue Atarax and Xanax  Discharge Diagnoses:  Principal Problem:   GI bleed Active Problems:   GERD (gastroesophageal reflux disease)   COPD (chronic obstructive pulmonary disease) ([place])   Anxiety   Acute metabolic encephalopathy   Crohn's disease of large intestine with other complication ([place])   Proctocolitis   Enteritis due to Clostridium difficile   Acute deep vein  thrombosis (DVT) of brachial vein of right upper extremity [facility])   Discharge Instructions:  Allergies as of 01/18/2022       Reactions   Codeine Shortness Of Breath, Swelling, Rash   Throat swelling   Dilaudid [hydromorphone Hcl] Shortness Of Breath, Swelling   Hydrocodone Shortness Of Breath, Swelling, Rash   Morphine And Related Shortness Of Breath, Swelling, Rash   Oxycodone Shortness Of Breath, Swelling, Rash   Patient  states ALL pain medications make her deathly sick.   Penicillins Shortness Of Breath, Swelling, Other (See Comments)   Has patient had a PCN reaction causing immediate rash, facial/tongue/throat swelling, SOB or lightheadedness with hypotension: Yes Has patient had a PCN reaction causing severe rash involving mucus membranes or skin necrosis: Yes Has patient had a PCN reaction that required hospitalization Yes Has patient had a PCN reaction occurring within the last 10 years: No If all of the above answers are "NO", then may proceed with Cephalosporin use. Throat swells 02/06/21--TOLERATES CEFTRIAXONE    Acetaminophen    Per MD patient states she can't take Tylenol because of liver enzymes   Ativan [lorazepam] Other (See Comments)   Migraines.   Humira [adalimumab]    rash   Strawberry Extract Swelling   Watermelon Concentrate Swelling   Albuterol Palpitations   Benadryl [diphenhydramine Hcl] Palpitations   Latex Rash   Remicade [infliximab] Rash   Blisters and Welts   Tramadol Rash   Rash and itching        Medication List     STOP taking these medications    apixaban 5 MG Tabs tablet Commonly known as: ELIQUIS       TAKE these medications    acetaminophen 325 MG tablet Commonly known as: TYLENOL Take 2 tablets (650 mg total) by mouth every 6 (six) hours as needed for mild pain or headache (or Fever >/= 101).   albuterol 108 (90 Base) MCG/ACT inhaler Commonly known as: VENTOLIN HFA Inhale 2 puffs into the lungs every 4 (four) hours as needed for wheezing or shortness of breath.   ALPRAZolam 1 MG tablet Commonly known as: XANAX Take 1 mg by mouth 3 (three) times daily as needed for anxiety.   amitriptyline 75 MG tablet Commonly known as: ELAVIL Take 75 mg by mouth at bedtime.   cetirizine 10 MG tablet Commonly known as: ZYRTEC Take 10 mg by mouth daily.   DULoxetine 30 MG capsule Commonly known as: CYMBALTA Take 30 mg by mouth daily.   feeding  supplement Liqd Take 237 mLs by mouth 2 (two) times daily between meals.   folic acid 1 MG tablet Commonly known as: FOLVITE Take 1 tablet (1 mg total) by mouth daily.   hydrOXYzine 25 MG tablet Commonly known as: ATARAX Take 25 mg by mouth 3 (three) times daily as needed for anxiety.   levETIRAcetam 750 MG tablet Commonly known as: KEPPRA Take 750 mg by mouth 2 (two) times daily.   nitroGLYCERIN 0.4 MG SL tablet Commonly known as: NITROSTAT Place 1 tablet (0.4 mg total) under the tongue every 5 (five) minutes as needed for chest pain.   ondansetron 4 MG disintegrating tablet Commonly known as: ZOFRAN-ODT Take 4 mg by mouth every 8 (eight) hours as needed for nausea or vomiting.   pantoprazole 40 MG tablet Commonly known as: PROTONIX Take 1 tablet (40 mg total) by mouth 2 (two) times daily.   predniSONE 10 MG tablet Commonly known as: DELTASONE Prednisone 60 mg daily for 1 week, prednisone  50 mg daily for 1 week, prednisone 40 mg daily for 1 week and then decrease by 5 mg weekly.   Restasis 0.05 % ophthalmic emulsion Generic drug: cycloSPORINE Place 1 drop into both eyes 2 (two) times daily.   Spiriva Respimat 2.5 MCG/ACT Aers Generic drug: Tiotropium Bromide Monohydrate Inhale 1 puff into the lungs daily.   vancomycin 250 MG capsule Commonly known as: VANCOCIN vancomycin taper: 500 mg orally 4 times daily for 14 days, then 500 mg orally 2 times daily for 7 days, then 500 mg orally once daily for 7 days, then 500 mg orally every other day for 4 weeks.   Vitamin D (Ergocalciferol) 1.25 MG (50000 UNIT) Caps capsule Commonly known as: DRISDOL Take 50,000 Units by mouth once a week. Takes on Wednesdays.        Follow-up Information     Teodoro Kil, [place]. Schedule an appointment as soon as possible for a visit in 2 week(s).   Specialty: Physician Assistant Why: Hospital Follow Up Contact information: [address] [place]  [address] [phone]         Satira Sark, MD .   Specialty: Cardiology Contact information: [place] [address] [phone]         Vladimir Crofts, MD. Go on 01/21/2022.   Specialty: Gastroenterology Why: as scheduled at 2pm Contact information: [address] [place] [address] [phone]                Allergies  Allergen Reactions   Codeine Shortness Of Breath, Swelling and Rash    Throat swelling   Dilaudid [Hydromorphone Hcl] Shortness Of Breath and Swelling   Hydrocodone Shortness Of Breath, Swelling and Rash   Morphine And Related Shortness Of Breath, Swelling and Rash   Oxycodone Shortness Of Breath, Swelling and Rash    Patient states ALL pain medications make her deathly sick.   Penicillins Shortness Of Breath, Swelling and Other (See Comments)    Has patient had a PCN reaction causing immediate rash, facial/tongue/throat swelling, SOB or lightheadedness with hypotension: Yes Has patient had a PCN reaction causing severe rash involving mucus membranes or skin necrosis: Yes Has patient had a PCN reaction that required hospitalization Yes Has patient had a PCN reaction occurring within the last 10 years: No If all of the above answers are "NO", then may proceed with Cephalosporin use.   Throat swells  02/06/21--TOLERATES CEFTRIAXONE     Acetaminophen     Per MD patient states she can't take Tylenol because of liver enzymes   Ativan [Lorazepam] Other (See Comments)    Migraines.   Humira [Adalimumab]     rash   Strawberry Extract Swelling   Watermelon Concentrate Swelling   Albuterol Palpitations   Benadryl [Diphenhydramine Hcl] Palpitations   Latex Rash   Remicade [Infliximab] Rash    Blisters and Welts    Tramadol Rash    Rash and itching   Allergies as of 01/18/2022       Reactions   Codeine Shortness Of Breath, Swelling, Rash   Throat swelling   Dilaudid [hydromorphone Hcl] Shortness Of  Breath, Swelling   Hydrocodone Shortness Of Breath, Swelling, Rash   Morphine And Related Shortness Of Breath, Swelling, Rash   Oxycodone Shortness Of Breath, Swelling, Rash   Patient states ALL pain medications make her deathly sick.   Penicillins Shortness Of Breath, Swelling, Other (See Comments)   Has patient had a PCN reaction  causing immediate rash, facial/tongue/throat swelling, SOB or lightheadedness with hypotension: Yes Has patient had a PCN reaction causing severe rash involving mucus membranes or skin necrosis: Yes Has patient had a PCN reaction that required hospitalization Yes Has patient had a PCN reaction occurring within the last 10 years: No If all of the above answers are "NO", then may proceed with Cephalosporin use. Throat swells 02/06/21--TOLERATES CEFTRIAXONE    Acetaminophen    Per MD patient states she can't take Tylenol because of liver enzymes   Ativan [lorazepam] Other (See Comments)   Migraines.   Humira [adalimumab]    rash   Strawberry Extract Swelling   Watermelon Concentrate Swelling   Albuterol Palpitations   Benadryl [diphenhydramine Hcl] Palpitations   Latex Rash   Remicade [infliximab] Rash   Blisters and Welts   Tramadol Rash   Rash and itching        Medication List     STOP taking these medications    apixaban 5 MG Tabs tablet Commonly known as: ELIQUIS       TAKE these medications    acetaminophen 325 MG tablet Commonly known as: TYLENOL Take 2 tablets (650 mg total) by mouth every 6 (six) hours as needed for mild pain or headache (or Fever >/= 101).   albuterol 108 (90 Base) MCG/ACT inhaler Commonly known as: VENTOLIN HFA Inhale 2 puffs into the lungs every 4 (four) hours as needed for wheezing or shortness of breath.   ALPRAZolam 1 MG tablet Commonly known as: XANAX Take 1 mg by mouth 3 (three) times daily as needed for anxiety.   amitriptyline 75 MG tablet Commonly known as: ELAVIL Take 75 mg by mouth at bedtime.    cetirizine 10 MG tablet Commonly known as: ZYRTEC Take 10 mg by mouth daily.   DULoxetine 30 MG capsule Commonly known as: CYMBALTA Take 30 mg by mouth daily.   feeding supplement Liqd Take 237 mLs by mouth 2 (two) times daily between meals.   folic acid 1 MG tablet Commonly known as: FOLVITE Take 1 tablet (1 mg total) by mouth daily.   hydrOXYzine 25 MG tablet Commonly known as: ATARAX Take 25 mg by mouth 3 (three) times daily as needed for anxiety.   levETIRAcetam 750 MG tablet Commonly known as: KEPPRA Take 750 mg by mouth 2 (two) times daily.   nitroGLYCERIN 0.4 MG SL tablet Commonly known as: NITROSTAT Place 1 tablet (0.4 mg total) under the tongue every 5 (five) minutes as needed for chest pain.   ondansetron 4 MG disintegrating tablet Commonly known as: ZOFRAN-ODT Take 4 mg by mouth every 8 (eight) hours as needed for nausea or vomiting.   pantoprazole 40 MG tablet Commonly known as: PROTONIX Take 1 tablet (40 mg total) by mouth 2 (two) times daily.   predniSONE 10 MG tablet Commonly known as: DELTASONE Prednisone 60 mg daily for 1 week, prednisone 50 mg daily for 1 week, prednisone 40 mg daily for 1 week and then decrease by 5 mg weekly.   Restasis 0.05 % ophthalmic emulsion Generic drug: cycloSPORINE Place 1 drop into both eyes 2 (two) times daily.   Spiriva Respimat 2.5 MCG/ACT Aers Generic drug: Tiotropium Bromide Monohydrate Inhale 1 puff into the lungs daily.   vancomycin 250 MG capsule Commonly known as: VANCOCIN vancomycin taper: 500 mg orally 4 times daily for 14 days, then 500 mg orally 2 times daily for 7 days, then 500 mg orally once daily for 7 days, then 500 mg orally  every other day for 4 weeks.   Vitamin D (Ergocalciferol) 1.25 MG (50000 UNIT) Caps capsule Commonly known as: DRISDOL Take 50,000 Units by mouth once a week. Takes on Wednesdays.        Procedures/Studies: CT ABDOMEN PELVIS W CONTRAST  Result Date: 01/15/2022 CLINICAL  DATA:  Crohn's exacerbation EXAM: CT ABDOMEN AND PELVIS WITH CONTRAST TECHNIQUE: Multidetector CT imaging of the abdomen and pelvis was performed using the standard protocol following bolus administration of intravenous contrast. RADIATION DOSE REDUCTION: This exam was performed according to the departmental dose-optimization program which includes automated exposure control, adjustment of the mA and/or kV according to patient size and/or use of iterative reconstruction technique. CONTRAST:  121m OMNIPAQUE IOHEXOL 350 MG/ML SOLN COMPARISON:  CT abdomen pelvis 04/30/2021, CT abdomen pelvis 12/23/2021, cm pelvis 02/03/2021, 7 pelvis 09/14/2015 FINDINGS: Lower chest: Interval decrease in trace bilateral pleural effusions, right greater than left. No acute abnormality. Persistent partially visualized at least trace to small volume pericardial effusion. Hepatobiliary: Spleen is mildly enlarged measuring up to 18.5 cm. No focal liver abnormality. Status post cholecystectomy. No biliary dilatation. Pancreas: No focal lesion. Normal pancreatic contour. No surrounding inflammatory changes. No main pancreatic ductal dilatation. Spleen: Normal in size without focal abnormality. Adrenals/Urinary Tract: No adrenal nodule bilaterally. Bilateral kidneys enhance symmetrically. No hydronephrosis. No hydroureter. The urinary bladder is unremarkable. On delayed imaging, there is no urothelial wall thickening and there are no filling defects in the opacified portions of the bilateral collecting systems or ureters. Stomach/Bowel: Stomach is within normal limits. Bowel wall thickening and mucosal hyperemia of the rectum and distal sigmoid colon. Associated mild pericolonic fat stranding. Mild presacral soft tissue edema. No evidence of small bowel wall thickening or dilatation. Stool noted throughout the ascending, transverse, descending colon. The appendix is not definitely identified with no inflammatory changes in the right lower  quadrant to suggest acute appendicitis. Vascular/Lymphatic: No abdominal aorta or iliac aneurysm. Mild atherosclerotic plaque of the aorta and its branches. No abdominal, pelvic, or inguinal lymphadenopathy. Reproductive: Status post hysterectomy. No adnexal masses. Other: No intraperitoneal free fluid. No intraperitoneal free gas. No organized fluid collection. Musculoskeletal: No abdominal wall hernia or abnormality. No suspicious lytic or blastic osseous lesions. No acute displaced fracture. Multilevel degenerative changes of the spine. IMPRESSION: 1. Persistent proctocolitis involving the rectosigmoid colon. Recommend colonoscopy status post treatment and status post complete resolution of inflammatory changes to exclude an underlying lesion. 2. Interval decrease in trace bilateral pleural effusions, right greater than left. 3. Partially visualized at least trace to small volume pericardial effusion. 4. Mild hepatomegaly. Electronically Signed   By: MIven FinnM.D.   On: 01/15/2022 23:30   [place] Img Upper Uni Right(DVT)  Result Date: 12/30/2021 CLINICAL DATA:  Right arm swelling for 3 days, indwelling PICC line EXAM: RIGHT UPPER EXTREMITY VENOUS DOPPLER ULTRASOUND TECHNIQUE: Gray-scale sonography with graded compression, as well as color Doppler and duplex ultrasound were performed to evaluate the upper extremity deep venous system from the level of the subclavian vein and including the jugular, axillary, basilic, radial, ulnar and upper cephalic vein. Spectral Doppler was utilized to evaluate flow at rest and with distal augmentation maneuvers. COMPARISON:  None Available. FINDINGS: Contralateral Subclavian Vein: Respiratory phasicity is normal and symmetric with the symptomatic side. No evidence of thrombus. Normal compressibility. Internal Jugular Vein: No evidence of thrombus. Normal compressibility, respiratory phasicity and response to augmentation. Subclavian Vein: There is an indwelling PICC  line. Echogenic nonocclusive thrombus is seen within the subclavian vein  surrounding the PICC line. Minimal color flow is detected. Axillary Vein: No evidence of thrombus. Normal compressibility, respiratory phasicity and response to augmentation. Cephalic Vein: No evidence of thrombus. Normal compressibility, respiratory phasicity and response to augmentation. Basilic Vein: Indwelling PICC line is identified. There is echogenic occlusive thrombus within the basilic vein surrounding the PICC line. No Doppler flow detected. Brachial Veins: No evidence of thrombus. Normal compressibility, respiratory phasicity and response to augmentation. Radial Veins: No evidence of thrombus. Normal compressibility, respiratory phasicity and response to augmentation. Ulnar Veins: No evidence of thrombus. Normal compressibility, respiratory phasicity and response to augmentation. Other Findings:  None visualized. IMPRESSION: 1. Occlusive thrombus within the right basilic vein surrounding the indwelling PICC line. There is nonocclusive thrombus extending into the subclavian vein surrounding the PICC line as well. These results will be called to the ordering clinician or representative by the Radiologist Assistant, and communication documented in the PACS or [employer]. Electronically Signed   By: Randa Ngo M.D.   On: 12/30/2021 10:53   CT ABDOMEN PELVIS W CONTRAST  Result Date: 12/28/2021 CLINICAL DATA:  History of C diff. Now progressive ileus and distention. Bowel obstruction suspected. Evaluate for megacolon. Abdominal pain. Abnormal abdominal x-ray. History of Crohn's disease. EXAM: CT ABDOMEN AND PELVIS WITH CONTRAST TECHNIQUE: Multidetector CT imaging of the abdomen and pelvis was performed using the standard protocol following bolus administration of intravenous contrast. RADIATION DOSE REDUCTION: This exam was performed according to the departmental dose-optimization program which includes automated exposure  control, adjustment of the mA and/or kV according to patient size and/or use of iterative reconstruction technique. CONTRAST:  160m OMNIPAQUE IOHEXOL 300 MG/ML  SOLN COMPARISON:  KUB 12/28/2021 and 12/23/2021; CT abdomen and pelvis 12/23/2021 FINDINGS: Lower chest: Heart size is again at the upper limits of normal. Mild to moderate pericardial effusion is unchanged. Hepatobiliary: Smooth liver contours. No focal liver mass is identified. Mildly decreased attenuation throughout the liver suggesting mild fatty infiltration. Cholecystectomy clips. No intrahepatic or extrahepatic biliary ductal dilatation. Pancreas: No mass or inflammatory fat stranding. No pancreatic ductal dilatation is seen. Spleen: Normal in size without focal abnormality. Adrenals/Urinary Tract: Adrenal glands are unremarkable. The kidneys enhance uniformly and are symmetric in size without hydronephrosis. No renal stone is seen. No renal mass is seen. The urinary bladder is decompressed by indwelling Foley catheter. Minimal associated air within the nondependent aspect of the urinary bladder. Stomach/Bowel: Rectal balloon catheter is again noted. The rectum and sigmoid colon are now decompressed with likely similar mild wall thickening again suggesting proctocolitis. Mild wall thickening of the proximal descending colon (axial series 2, image 41) appears new from prior. There is oral contrast seen as distal as the ileum. There are air-fluid levels again seen within the ascending, transverse, and proximal descending colon with fluid also seen within the more distal colon. No loss of haustra markings within the colon to specifically raise suspicion for toxic megacolon. No dilated loops of small bowel are seen. Vascular/Lymphatic: No abdominal aortic aneurysm. No mesenteric, retroperitoneal, or pelvic lymphadenopathy. Reproductive: The uterus is surgically absent. No gross adnexal abnormality. Other: No abdominal wall hernia or abnormality.Moderate  subcutaneous fat edema within the bilateral lower flanks and buttocks. No pneumoperitoneum. Mild free fluid within the pelvis is increased from prior. Musculoskeletal: Minimal levocurvature centered at L3. Mild anterior L2-3 endplate osteophytes. IMPRESSION: Compared to 12/23/2021: 1. Redemonstration of wall thickening within the rectum and sigmoid colon suggesting proctocolitis. New mild wall thickening within a portion of the proximal descending colon.  Air-fluid levels throughout the colon as can be seen with diarrheal illness. 2. No dilated loops of small bowel to indicate bowel obstruction. 3. Mildly increased mild free fluid within the pelvis. 4. Increased moderate bilateral flank and buttock subcutaneous fat edema. Electronically Signed   By: Yvonne Kendall M.D.   On: 12/28/2021 18:18   DG Abd Portable 2V  Result Date: 12/28/2021 CLINICAL DATA:  Abdominal pain EXAM: PORTABLE ABDOMEN - 2 VIEW COMPARISON:  December 23, 2021 FINDINGS: Air-filled prominent loops of large and small bowel are identified. A loop of colon in the right upper quadrant is dilated up to 8.2 cm. The amount of dilatation is increased since December 23, 2021. No free air, portal venous gas, or pneumatosis. No other acute abnormalities. IMPRESSION: Air-filled dilated loops of large and small bowel most consistent with ileus. There is a loop of colon in the right upper quadrant measuring up to 8.2 cm. Recommend attention on follow-up. Electronically Signed   By: Dorise Bullion III M.D.   On: 12/28/2021 11:58   [place] EKG SITE RITE  Result Date: 12/24/2021 If Site Rite image not attached, placement could not be confirmed due to current cardiac rhythm.  CT HEAD WO CONTRAST (5MM)  Result Date: 12/23/2021 CLINICAL DATA:  Mental status change, unknown cause Falls, AMS, hypotension. EXAM: CT HEAD WITHOUT CONTRAST TECHNIQUE: Contiguous axial images were obtained from the base of the skull through the vertex without intravenous contrast. RADIATION  DOSE REDUCTION: This exam was performed according to the departmental dose-optimization program which includes automated exposure control, adjustment of the mA and/or kV according to patient size and/or use of iterative reconstruction technique. COMPARISON:  None Available. FINDINGS: Brain: Normal anatomic configuration. No abnormal intra or extra-axial mass lesion or fluid collection. No abnormal mass effect or midline shift. No evidence of acute intracranial hemorrhage or infarct. Ventricular size is normal. Cerebellum unremarkable. Vascular: Unremarkable Skull: Intact Sinuses/Orbits: Paranasal sinuses are clear. Orbits are unremarkable. Other: Mastoid air cells and middle ear cavities are clear. IMPRESSION: No acute intracranial abnormality. Electronically Signed   By: Fidela Salisbury M.D.   On: 12/23/2021 21:58   CT ABDOMEN PELVIS W CONTRAST  Result Date: 12/23/2021 CLINICAL DATA:  Abdominal pain and bloating for 1 week. Coffee-ground emesis. EXAM: CT ABDOMEN AND PELVIS WITH CONTRAST TECHNIQUE: Multidetector CT imaging of the abdomen and pelvis was performed using the standard protocol following bolus administration of intravenous contrast. RADIATION DOSE REDUCTION: This exam was performed according to the departmental dose-optimization program which includes automated exposure control, adjustment of the mA and/or kV according to patient size and/or use of iterative reconstruction technique. CONTRAST:  15m OMNIPAQUE IOHEXOL 300 MG/ML  SOLN COMPARISON:  10/24/2021 FINDINGS: Lower Chest: Stable small pericardial effusion. Hepatobiliary: No hepatic masses identified. Prior cholecystectomy. No evidence of biliary obstruction. Pancreas:  No mass or inflammatory changes. Spleen: Within normal limits in size and appearance. Adrenals/Urinary Tract: No masses identified. No evidence of ureteral calculi or hydronephrosis. Foley catheter is seen within the bladder. Stomach/Bowel: Air-fluid levels are seen throughout  the colon which is mildly distended, consistent with ileus. Mild wall thickening is again seen involving the rectum and distal sigmoid colon with mild perirectal inflammatory changes, consistent with proctocolitis. No evidence of abscess or bowel obstruction. Vascular/Lymphatic: No pathologically enlarged lymph nodes. No acute vascular findings. Right femoral central venous catheter in appropriate position. Reproductive: Prior hysterectomy noted. Adnexal regions are unremarkable in appearance. Other: Soft tissue gas noted in the right inguinal region and abductor muscles of  the thigh, likely related to the right femoral venous catheter placement. Musculoskeletal:  No suspicious bone lesions identified. IMPRESSION: Mild proctocolitis involving the rectosigmoid, without significant change. No evidence of abscess or other complication. Mild colonic ileus. Stable small pericardial effusion. Electronically Signed   By: Marlaine Hind M.D.   On: 12/23/2021 19:08   DG Chest Port 1 View  Result Date: 12/23/2021 CLINICAL DATA:  Found unresponsive. EXAM: PORTABLE CHEST 1 VIEW COMPARISON:  March 20, 2021 FINDINGS: The heart size and mediastinal contours are within normal limits. Both lungs are clear. The visualized skeletal structures are unremarkable. IMPRESSION: No active disease. Electronically Signed   By: Virgina Norfolk M.D.   On: 12/23/2021 17:18   DG Abdomen 1 View  Result Date: 12/23/2021 CLINICAL DATA:  Central line placement EXAM: ABDOMEN - 1 VIEW COMPARISON:  None Available. FINDINGS: Right femoral central line is in place with the tip in the expected location of the common iliac vein. Bowel gas pattern is unremarkable. IMPRESSION: Central line on the right at the expected location of the common iliac vein. Electronically Signed   By: Nelson Chimes M.D.   On: 12/23/2021 15:36     Subjective: Pt reports feeling much better, still having a little rectal bleeding, eating and drinking well.  Pt wants to go  home.  Pt has appt with Dr. Nyoka Cowden on 9/21 that she will keep.   Discharge Exam: Vitals:   01/17/22 2049 01/18/22 0511  BP: 132/78 113/65  Pulse: 97 99  Resp: 20 14  Temp: (!) 97.5 F (36.4 C) 98 F (36.7 C)  SpO2: 98% 97%   Vitals:   01/17/22 0957 01/17/22 1529 01/17/22 2049 01/18/22 0511  BP:  105/78 132/78 113/65  Pulse:  (!) 113 97 99  Resp:  18 20 14   Temp:  98 F (36.7 C) (!) 97.5 F (36.4 C) 98 F (36.7 C)  TempSrc:      SpO2: 95% 100% 98% 97%  Weight:      Height:       General: Pt is alert, awake, not in acute distress Cardiovascular: RRR, S1/S2 +, no rubs, no gallops Respiratory: CTA bilaterally, no wheezing, no rhonchi Abdominal: Soft, NT, ND, bowel sounds + Extremities: no edema, no cyanosis   The results of significant diagnostics from this hospitalization (including imaging, microbiology, ancillary and laboratory) are listed below for reference.     Microbiology: No results found for this or any previous visit (from the past 240 hour(s)).   Labs: BNP (last 3 results) No results for input(s): "BNP" in the last 8760 hours. Basic Metabolic Panel: Recent Labs  Lab 01/15/22 2050 01/16/22 0546  NA 137 138  K 4.2 3.7  CL 102 104  CO2 27 29  GLUCOSE 143* 100*  BUN 11 11  CREATININE 0.67 0.59  CALCIUM 8.7* 8.7*  MG  --  2.2   Liver Function Tests: Recent Labs  Lab 01/16/22 0546  AST 10*  ALT 13  ALKPHOS 56  BILITOT 0.4  PROT 4.8*  ALBUMIN 2.3*   No results for input(s): "LIPASE", "AMYLASE" in the last 168 hours. No results for input(s): "AMMONIA" in the last 168 hours. CBC: Recent Labs  Lab 01/13/22 1057 01/15/22 2050 01/16/22 0546  WBC 7.4 8.0 7.0  NEUTROABS  --  6.0 4.0  HGB 10.1* 9.8* 9.8*  HCT 30.4* 31.0* 30.7*  MCV 96 98.7 99.0  PLT 389 330 281   Cardiac Enzymes: No results for input(s): "CKTOTAL", "CKMB", "CKMBINDEX", "TROPONINI"  in the last 168 hours. BNP: Invalid input(s): "POCBNP" CBG: No results for input(s):  "GLUCAP" in the last 168 hours. D-Dimer No results for input(s): "DDIMER" in the last 72 hours. Hgb A1c No results for input(s): "HGBA1C" in the last 72 hours. Lipid Profile No results for input(s): "CHOL", "HDL", "LDLCALC", "TRIG", "CHOLHDL", "LDLDIRECT" in the last 72 hours. Thyroid function studies No results for input(s): "TSH", "T4TOTAL", "T3FREE", "THYROIDAB" in the last 72 hours.  Invalid input(s): "FREET3" Anemia work up No results for input(s): "VITAMINB12", "FOLATE", "FERRITIN", "TIBC", "IRON", "RETICCTPCT" in the last 72 hours. Urinalysis    Component Value Date/Time   COLORURINE AMBER (A) 12/23/2021 1641   APPEARANCEUR HAZY (A) 12/23/2021 1641   LABSPEC 1.019 12/23/2021 1641   PHURINE 5.0 12/23/2021 1641   GLUCOSEU NEGATIVE 12/23/2021 1641   GLUCOSEU NEG mg/dL 12/24/2009 2317   HGBUR NEGATIVE 12/23/2021 1641   BILIRUBINUR NEGATIVE 12/23/2021 1641   KETONESUR NEGATIVE 12/23/2021 1641   PROTEINUR 30 (A) 12/23/2021 1641   UROBILINOGEN 0.2 12/26/2013 1530   NITRITE NEGATIVE 12/23/2021 1641   LEUKOCYTESUR MODERATE (A) 12/23/2021 1641   Sepsis Labs Recent Labs  Lab 01/13/22 1057 01/15/22 2050 01/16/22 0546  WBC 7.4 8.0 7.0   Microbiology No results found for this or any previous visit (from the past 240 hour(s)).  Time coordinating discharge: 35 mins   SIGNED:  Irwin Brakeman, MD  [facility] 01/18/2022, 11:44 AM How to contact the [facility] Attending or Consulting provider [place] or covering provider during after hours [place], for this patient?  Check the care team in [facility] and look for a) attending/consulting [facility] provider listed and b) the [facility] team listed Log into [email or web address] and use [place]'s universal password to access. If you do not have the password, please contact the hospital operator. Locate the [facility] provider you are looking for under [facility] and page to a number that you can be directly reached. If you still have difficulty  reaching the provider, please page the [facility] (Director on Call) for the Hospitalists listed on amion for assistance.

## 2022-01-18 NOTE — Progress Notes (Signed)
Gastroenterology Progress Note    Primary Care Physician:  Teodoro Kil, PA-C Primary Gastroenterologist:  Dr. Jenetta Downer   Patient ID: [name]; [id number]; 1971/02/17    Subjective   Abdominal pain improved. No N/V. Tolerating diet. Soft, mushy stool this morning with small amount of blood. Eager to go home.    Objective   Vital signs in last 24 hours Temp:  [97.5 F (36.4 C)-98 F (36.7 C)] 98 F (36.7 C) (09/18 0511) Pulse Rate:  [97-113] 99 (09/18 0511) Resp:  [14-20] 14 (09/18 0511) BP: (105-132)/(65-78) 113/65 (09/18 0511) SpO2:  [95 %-100 %] 97 % (09/18 0511) Last BM Date : 01/17/22  Physical Exam General:   Alert and oriented, pleasant Head:  Normocephalic and atraumatic. Abdomen:  Bowel sounds present, soft, non-tender, non-distended. No HSM or hernias noted. No rebound or guarding. No masses appreciated  Extremities:  Without edema. Neurologic:  Alert and  oriented x4  Intake/Output from previous day: 09/17 0701 - 09/18 0700 In: 2552.2 [P.O.:1140; I.V.:1412.2] Out: -  Intake/Output this shift: No intake/output data recorded.  Lab Results  Recent Labs    01/15/22 2050 01/16/22 0546  WBC 8.0 7.0  HGB 9.8* 9.8*  HCT 31.0* 30.7*  PLT 330 281   BMET Recent Labs    01/15/22 2050 01/16/22 0546  NA 137 138  K 4.2 3.7  CL 102 104  CO2 27 29  GLUCOSE 143* 100*  BUN 11 11  CREATININE 0.67 0.59  CALCIUM 8.7* 8.7*   LFT Recent Labs    01/16/22 0546  PROT 4.8*  ALBUMIN 2.3*  AST 10*  ALT 13  ALKPHOS 56  BILITOT 0.4     Studies/Results CT ABDOMEN PELVIS W CONTRAST  Result Date: 01/15/2022 CLINICAL DATA:  Crohn's exacerbation EXAM: CT ABDOMEN AND PELVIS WITH CONTRAST TECHNIQUE: Multidetector CT imaging of the abdomen and pelvis was performed using the standard protocol following bolus administration of intravenous contrast. RADIATION DOSE REDUCTION: This exam was performed according to the departmental dose-optimization program  which includes automated exposure control, adjustment of the mA and/or kV according to patient size and/or use of iterative reconstruction technique. CONTRAST:  156m OMNIPAQUE IOHEXOL 350 MG/ML SOLN COMPARISON:  CT abdomen pelvis 04/30/2021, CT abdomen pelvis 12/23/2021, cm pelvis 02/03/2021, 7 pelvis 09/14/2015 FINDINGS: Lower chest: Interval decrease in trace bilateral pleural effusions, right greater than left. No acute abnormality. Persistent partially visualized at least trace to small volume pericardial effusion. Hepatobiliary: Spleen is mildly enlarged measuring up to 18.5 cm. No focal liver abnormality. Status post cholecystectomy. No biliary dilatation. Pancreas: No focal lesion. Normal pancreatic contour. No surrounding inflammatory changes. No main pancreatic ductal dilatation. Spleen: Normal in size without focal abnormality. Adrenals/Urinary Tract: No adrenal nodule bilaterally. Bilateral kidneys enhance symmetrically. No hydronephrosis. No hydroureter. The urinary bladder is unremarkable. On delayed imaging, there is no urothelial wall thickening and there are no filling defects in the opacified portions of the bilateral collecting systems or ureters. Stomach/Bowel: Stomach is within normal limits. Bowel wall thickening and mucosal hyperemia of the rectum and distal sigmoid colon. Associated mild pericolonic fat stranding. Mild presacral soft tissue edema. No evidence of small bowel wall thickening or dilatation. Stool noted throughout the ascending, transverse, descending colon. The appendix is not definitely identified with no inflammatory changes in the right lower quadrant to suggest acute appendicitis. Vascular/Lymphatic: No abdominal aorta or iliac aneurysm. Mild atherosclerotic plaque of the aorta and its branches. No abdominal, pelvic, or inguinal lymphadenopathy. Reproductive: Status post hysterectomy. No adnexal  masses. Other: No intraperitoneal free fluid. No intraperitoneal free gas. No  organized fluid collection. Musculoskeletal: No abdominal wall hernia or abnormality. No suspicious lytic or blastic osseous lesions. No acute displaced fracture. Multilevel degenerative changes of the spine. IMPRESSION: 1. Persistent proctocolitis involving the rectosigmoid colon. Recommend colonoscopy status post treatment and status post complete resolution of inflammatory changes to exclude an underlying lesion. 2. Interval decrease in trace bilateral pleural effusions, right greater than left. 3. Partially visualized at least trace to small volume pericardial effusion. 4. Mild hepatomegaly. Electronically Signed   By: Iven Finn M.D.   On: 01/15/2022 23:30   US Venous Img Upper Uni Right(DVT)  Result Date: 12/30/2021 CLINICAL DATA:  Right arm swelling for 3 days, indwelling PICC line EXAM: RIGHT UPPER EXTREMITY VENOUS DOPPLER ULTRASOUND TECHNIQUE: Gray-scale sonography with graded compression, as well as color Doppler and duplex ultrasound were performed to evaluate the upper extremity deep venous system from the level of the subclavian vein and including the jugular, axillary, basilic, radial, ulnar and upper cephalic vein. Spectral Doppler was utilized to evaluate flow at rest and with distal augmentation maneuvers. COMPARISON:  None Available. FINDINGS: Contralateral Subclavian Vein: Respiratory phasicity is normal and symmetric with the symptomatic side. No evidence of thrombus. Normal compressibility. Internal Jugular Vein: No evidence of thrombus. Normal compressibility, respiratory phasicity and response to augmentation. Subclavian Vein: There is an indwelling PICC line. Echogenic nonocclusive thrombus is seen within the subclavian vein surrounding the PICC line. Minimal color flow is detected. Axillary Vein: No evidence of thrombus. Normal compressibility, respiratory phasicity and response to augmentation. Cephalic Vein: No evidence of thrombus. Normal compressibility, respiratory phasicity  and response to augmentation. Basilic Vein: Indwelling PICC line is identified. There is echogenic occlusive thrombus within the basilic vein surrounding the PICC line. No Doppler flow detected. Brachial Veins: No evidence of thrombus. Normal compressibility, respiratory phasicity and response to augmentation. Radial Veins: No evidence of thrombus. Normal compressibility, respiratory phasicity and response to augmentation. Ulnar Veins: No evidence of thrombus. Normal compressibility, respiratory phasicity and response to augmentation. Other Findings:  None visualized. IMPRESSION: 1. Occlusive thrombus within the right basilic vein surrounding the indwelling PICC line. There is nonocclusive thrombus extending into the subclavian vein surrounding the PICC line as well. These results will be called to the ordering clinician or representative by the Radiologist Assistant, and communication documented in the PACS or [employer]. Electronically Signed   By: Randa Ngo M.D.   On: 12/30/2021 10:53   CT ABDOMEN PELVIS W CONTRAST  Result Date: 12/28/2021 CLINICAL DATA:  History of C diff. Now progressive ileus and distention. Bowel obstruction suspected. Evaluate for megacolon. Abdominal pain. Abnormal abdominal x-ray. History of Crohn's disease. EXAM: CT ABDOMEN AND PELVIS WITH CONTRAST TECHNIQUE: Multidetector CT imaging of the abdomen and pelvis was performed using the standard protocol following bolus administration of intravenous contrast. RADIATION DOSE REDUCTION: This exam was performed according to the departmental dose-optimization program which includes automated exposure control, adjustment of the mA and/or kV according to patient size and/or use of iterative reconstruction technique. CONTRAST:  165m OMNIPAQUE IOHEXOL 300 MG/ML  SOLN COMPARISON:  KUB 12/28/2021 and 12/23/2021; CT abdomen and pelvis 12/23/2021 FINDINGS: Lower chest: Heart size is again at the upper limits of normal. Mild to moderate  pericardial effusion is unchanged. Hepatobiliary: Smooth liver contours. No focal liver mass is identified. Mildly decreased attenuation throughout the liver suggesting mild fatty infiltration. Cholecystectomy clips. No intrahepatic or extrahepatic biliary ductal dilatation. Pancreas: No mass or inflammatory  fat stranding. No pancreatic ductal dilatation is seen. Spleen: Normal in size without focal abnormality. Adrenals/Urinary Tract: Adrenal glands are unremarkable. The kidneys enhance uniformly and are symmetric in size without hydronephrosis. No renal stone is seen. No renal mass is seen. The urinary bladder is decompressed by indwelling Foley catheter. Minimal associated air within the nondependent aspect of the urinary bladder. Stomach/Bowel: Rectal balloon catheter is again noted. The rectum and sigmoid colon are now decompressed with likely similar mild wall thickening again suggesting proctocolitis. Mild wall thickening of the proximal descending colon (axial series 2, image 41) appears new from prior. There is oral contrast seen as distal as the ileum. There are air-fluid levels again seen within the ascending, transverse, and proximal descending colon with fluid also seen within the more distal colon. No loss of haustra markings within the colon to specifically raise suspicion for toxic megacolon. No dilated loops of small bowel are seen. Vascular/Lymphatic: No abdominal aortic aneurysm. No mesenteric, retroperitoneal, or pelvic lymphadenopathy. Reproductive: The uterus is surgically absent. No gross adnexal abnormality. Other: No abdominal wall hernia or abnormality.Moderate subcutaneous fat edema within the bilateral lower flanks and buttocks. No pneumoperitoneum. Mild free fluid within the pelvis is increased from prior. Musculoskeletal: Minimal levocurvature centered at L3. Mild anterior L2-3 endplate osteophytes. IMPRESSION: Compared to 12/23/2021: 1. Redemonstration of wall thickening within the  rectum and sigmoid colon suggesting proctocolitis. New mild wall thickening within a portion of the proximal descending colon. Air-fluid levels throughout the colon as can be seen with diarrheal illness. 2. No dilated loops of small bowel to indicate bowel obstruction. 3. Mildly increased mild free fluid within the pelvis. 4. Increased moderate bilateral flank and buttock subcutaneous fat edema. Electronically Signed   By: Yvonne Kendall M.D.   On: 12/28/2021 18:18   DG Abd Portable 2V  Result Date: 12/28/2021 CLINICAL DATA:  Abdominal pain EXAM: PORTABLE ABDOMEN - 2 VIEW COMPARISON:  December 23, 2021 FINDINGS: Air-filled prominent loops of large and small bowel are identified. A loop of colon in the right upper quadrant is dilated up to 8.2 cm. The amount of dilatation is increased since December 23, 2021. No free air, portal venous gas, or pneumatosis. No other acute abnormalities. IMPRESSION: Air-filled dilated loops of large and small bowel most consistent with ileus. There is a loop of colon in the right upper quadrant measuring up to 8.2 cm. Recommend attention on follow-up. Electronically Signed   By: Dorise Bullion III M.D.   On: 12/28/2021 11:58   [place] EKG SITE RITE  Result Date: 12/24/2021 If Site Rite image not attached, placement could not be confirmed due to current cardiac rhythm.  CT HEAD WO CONTRAST (5MM)  Result Date: 12/23/2021 CLINICAL DATA:  Mental status change, unknown cause Falls, AMS, hypotension. EXAM: CT HEAD WITHOUT CONTRAST TECHNIQUE: Contiguous axial images were obtained from the base of the skull through the vertex without intravenous contrast. RADIATION DOSE REDUCTION: This exam was performed according to the departmental dose-optimization program which includes automated exposure control, adjustment of the mA and/or kV according to patient size and/or use of iterative reconstruction technique. COMPARISON:  None Available. FINDINGS: Brain: Normal anatomic configuration. No  abnormal intra or extra-axial mass lesion or fluid collection. No abnormal mass effect or midline shift. No evidence of acute intracranial hemorrhage or infarct. Ventricular size is normal. Cerebellum unremarkable. Vascular: Unremarkable Skull: Intact Sinuses/Orbits: Paranasal sinuses are clear. Orbits are unremarkable. Other: Mastoid air cells and middle ear cavities are clear. IMPRESSION: No acute intracranial abnormality.  Electronically Signed   By: Fidela Salisbury M.D.   On: 12/23/2021 21:58   CT ABDOMEN PELVIS W CONTRAST  Result Date: 12/23/2021 CLINICAL DATA:  Abdominal pain and bloating for 1 week. Coffee-ground emesis. EXAM: CT ABDOMEN AND PELVIS WITH CONTRAST TECHNIQUE: Multidetector CT imaging of the abdomen and pelvis was performed using the standard protocol following bolus administration of intravenous contrast. RADIATION DOSE REDUCTION: This exam was performed according to the departmental dose-optimization program which includes automated exposure control, adjustment of the mA and/or kV according to patient size and/or use of iterative reconstruction technique. CONTRAST:  31m OMNIPAQUE IOHEXOL 300 MG/ML  SOLN COMPARISON:  10/24/2021 FINDINGS: Lower Chest: Stable small pericardial effusion. Hepatobiliary: No hepatic masses identified. Prior cholecystectomy. No evidence of biliary obstruction. Pancreas:  No mass or inflammatory changes. Spleen: Within normal limits in size and appearance. Adrenals/Urinary Tract: No masses identified. No evidence of ureteral calculi or hydronephrosis. Foley catheter is seen within the bladder. Stomach/Bowel: Air-fluid levels are seen throughout the colon which is mildly distended, consistent with ileus. Mild wall thickening is again seen involving the rectum and distal sigmoid colon with mild perirectal inflammatory changes, consistent with proctocolitis. No evidence of abscess or bowel obstruction. Vascular/Lymphatic: No pathologically enlarged lymph nodes. No acute  vascular findings. Right femoral central venous catheter in appropriate position. Reproductive: Prior hysterectomy noted. Adnexal regions are unremarkable in appearance. Other: Soft tissue gas noted in the right inguinal region and abductor muscles of the thigh, likely related to the right femoral venous catheter placement. Musculoskeletal:  No suspicious bone lesions identified. IMPRESSION: Mild proctocolitis involving the rectosigmoid, without significant change. No evidence of abscess or other complication. Mild colonic ileus. Stable small pericardial effusion. Electronically Signed   By: JMarlaine HindM.D.   On: 12/23/2021 19:08   DG Chest Port 1 View  Result Date: 12/23/2021 CLINICAL DATA:  Found unresponsive. EXAM: PORTABLE CHEST 1 VIEW COMPARISON:  March 20, 2021 FINDINGS: The heart size and mediastinal contours are within normal limits. Both lungs are clear. The visualized skeletal structures are unremarkable. IMPRESSION: No active disease. Electronically Signed   By: TVirgina NorfolkM.D.   On: 12/23/2021 17:18   DG Abdomen 1 View  Result Date: 12/23/2021 CLINICAL DATA:  Central line placement EXAM: ABDOMEN - 1 VIEW COMPARISON:  None Available. FINDINGS: Right femoral central line is in place with the tip in the expected location of the common iliac vein. Bowel gas pattern is unremarkable. IMPRESSION: Central line on the right at the expected location of the common iliac vein. Electronically Signed   By: MNelson ChimesM.D.   On: 12/23/2021 15:36    Assessment  51y.o. female with significant history of uncontrolled Crohn's disease who has failed multiple treatments with recurrent C diff, most recent C diff infection at the end of August, currently on Vanc taper, readmitted with rectal bleeding in setting of Crohn's colitis/proctitis.   She has had notable improvement with supportive measures this admission and appropriate for discharge home today.   She will need to continue on Vanc taper,  and she is due for decreasing to Vanc 500 mg po BID today for 7 days followed by 500 mg daily for 7 days followed by 500 mg every other day for 14 doses.   Continue Prednisone 40 mg with taper thereafter.   Plan / Recommendations  Appropriate for discharge today [facility] Cowdenon [facility]/21 at 1400.  Continue planned vanc taper as outlined above  Prednisone 40 mg with taper thereafter GI signing off      LOS: 2 days    01/18/2022, 8:24 AM  Annitta Needs, PhD, ANP-BC [facility]

## 2022-01-19 DIAGNOSIS — M62838 Other muscle spasm: Secondary | ICD-10-CM | POA: Diagnosis not present

## 2022-01-19 DIAGNOSIS — E119 Type 2 diabetes mellitus without complications: Secondary | ICD-10-CM | POA: Diagnosis not present

## 2022-01-19 DIAGNOSIS — J449 Chronic obstructive pulmonary disease, unspecified: Secondary | ICD-10-CM | POA: Diagnosis not present

## 2022-01-19 DIAGNOSIS — Z885 Allergy status to narcotic agent status: Secondary | ICD-10-CM | POA: Diagnosis not present

## 2022-01-19 DIAGNOSIS — Z87892 Personal history of anaphylaxis: Secondary | ICD-10-CM | POA: Diagnosis not present

## 2022-01-19 DIAGNOSIS — G40909 Epilepsy, unspecified, not intractable, without status epilepticus: Secondary | ICD-10-CM | POA: Diagnosis not present

## 2022-01-19 DIAGNOSIS — K219 Gastro-esophageal reflux disease without esophagitis: Secondary | ICD-10-CM | POA: Diagnosis not present

## 2022-01-19 DIAGNOSIS — R69 Illness, unspecified: Secondary | ICD-10-CM | POA: Diagnosis not present

## 2022-01-19 DIAGNOSIS — Z809 Family history of malignant neoplasm, unspecified: Secondary | ICD-10-CM | POA: Diagnosis not present

## 2022-01-19 DIAGNOSIS — Z881 Allergy status to other antibiotic agents status: Secondary | ICD-10-CM | POA: Diagnosis not present

## 2022-01-21 DIAGNOSIS — K5010 Crohn's disease of large intestine without complications: Secondary | ICD-10-CM | POA: Diagnosis not present

## 2022-01-22 DIAGNOSIS — K5120 Ulcerative (chronic) proctitis without complications: Secondary | ICD-10-CM | POA: Diagnosis not present

## 2022-01-22 DIAGNOSIS — K922 Gastrointestinal hemorrhage, unspecified: Secondary | ICD-10-CM | POA: Diagnosis not present

## 2022-01-22 DIAGNOSIS — K5010 Crohn's disease of large intestine without complications: Secondary | ICD-10-CM | POA: Diagnosis not present

## 2022-01-22 DIAGNOSIS — K625 Hemorrhage of anus and rectum: Secondary | ICD-10-CM | POA: Diagnosis not present

## 2022-01-22 DIAGNOSIS — D500 Iron deficiency anemia secondary to blood loss (chronic): Secondary | ICD-10-CM | POA: Diagnosis not present

## 2022-01-22 DIAGNOSIS — K50111 Crohn's disease of large intestine with rectal bleeding: Secondary | ICD-10-CM | POA: Diagnosis not present

## 2022-01-22 DIAGNOSIS — R69 Illness, unspecified: Secondary | ICD-10-CM | POA: Diagnosis not present

## 2022-01-22 DIAGNOSIS — E119 Type 2 diabetes mellitus without complications: Secondary | ICD-10-CM | POA: Diagnosis not present

## 2022-01-22 DIAGNOSIS — R569 Unspecified convulsions: Secondary | ICD-10-CM | POA: Diagnosis not present

## 2022-01-22 DIAGNOSIS — R1084 Generalized abdominal pain: Secondary | ICD-10-CM | POA: Diagnosis not present

## 2022-01-22 DIAGNOSIS — J449 Chronic obstructive pulmonary disease, unspecified: Secondary | ICD-10-CM | POA: Diagnosis not present

## 2022-01-22 DIAGNOSIS — G40909 Epilepsy, unspecified, not intractable, without status epilepticus: Secondary | ICD-10-CM | POA: Diagnosis not present

## 2022-01-22 DIAGNOSIS — Z8719 Personal history of other diseases of the digestive system: Secondary | ICD-10-CM | POA: Diagnosis not present

## 2022-01-22 DIAGNOSIS — G4700 Insomnia, unspecified: Secondary | ICD-10-CM | POA: Diagnosis not present

## 2022-01-22 DIAGNOSIS — R197 Diarrhea, unspecified: Secondary | ICD-10-CM | POA: Diagnosis not present

## 2022-01-22 DIAGNOSIS — R935 Abnormal findings on diagnostic imaging of other abdominal regions, including retroperitoneum: Secondary | ICD-10-CM | POA: Diagnosis not present

## 2022-01-22 DIAGNOSIS — I10 Essential (primary) hypertension: Secondary | ICD-10-CM | POA: Diagnosis not present

## 2022-01-25 DIAGNOSIS — Z8719 Personal history of other diseases of the digestive system: Secondary | ICD-10-CM | POA: Diagnosis not present

## 2022-01-25 DIAGNOSIS — K5090 Crohn's disease, unspecified, without complications: Secondary | ICD-10-CM | POA: Diagnosis not present

## 2022-01-25 DIAGNOSIS — R935 Abnormal findings on diagnostic imaging of other abdominal regions, including retroperitoneum: Secondary | ICD-10-CM | POA: Diagnosis not present

## 2022-01-25 DIAGNOSIS — K50111 Crohn's disease of large intestine with rectal bleeding: Secondary | ICD-10-CM | POA: Diagnosis not present

## 2022-01-25 DIAGNOSIS — R109 Unspecified abdominal pain: Secondary | ICD-10-CM | POA: Diagnosis not present

## 2022-02-12 DIAGNOSIS — K6289 Other specified diseases of anus and rectum: Secondary | ICD-10-CM | POA: Diagnosis not present

## 2022-02-12 DIAGNOSIS — R112 Nausea with vomiting, unspecified: Secondary | ICD-10-CM | POA: Diagnosis not present

## 2022-02-12 DIAGNOSIS — R531 Weakness: Secondary | ICD-10-CM | POA: Diagnosis not present

## 2022-02-18 DIAGNOSIS — K50119 Crohn's disease of large intestine with unspecified complications: Secondary | ICD-10-CM | POA: Diagnosis not present

## 2022-02-18 DIAGNOSIS — K50811 Crohn's disease of both small and large intestine with rectal bleeding: Secondary | ICD-10-CM | POA: Diagnosis not present

## 2022-02-22 NOTE — Progress Notes (Signed)
Cardiology Office Note  Date: 02/23/2022   ID: [name], DOB Jun 15, 1970, MRN [id number]  PCP:  Teodoro Kil, PA-C  Cardiologist:  Rozann Lesches, MD Electrophysiologist:  None   Chief Complaint  Patient presents with   Cardiac follow-up    History of Present Illness: [name] is a 51 y.o. female that I met in September 2022.  She presents for a follow-up visit.  Reports relatively fast resting heart rate and intermittent sense of palpitations.  She was hospitalized in September with GI bleed in the setting of Crohn's disease and proctocolitis.  She was seen by gastroenterology.  Admission earlier in September also noted in the setting of septic shock with C. difficile colitis, E. coli UTI with acute renal insufficiency and a right upper extremity DVT treated with Eliquis (PICC line removed).  Three month anticoagulation course from recommended at that time.  In terms of prior cardiac testing, monitor ordered back in September of last year was not completed.  Her most recent echocardiogram in September 2022 showed a moderate sized pericardial effusion.  Of note, more recent CT imaging in September of this year showed only a trace to small sized pericardial effusion.  Past Medical History:  Diagnosis Date   Anxiety    Asthma    Chronic low back pain    Collagen vascular disease (HCC)    COPD (chronic obstructive pulmonary disease) (HCC)    Crohn's disease (HCC)    GERD (gastroesophageal reflux disease)    EGD 01/2007 by Dr.Rourke small hiatal hernia s/p 56 french maloney    History of head injury    Hypertension    IBS (irritable bowel syndrome)    PSVT (paroxysmal supraventricular tachycardia)    PTSD (post-traumatic stress disorder)    Recurrent chest pain    Seizure disorder ([place])    Type 2 diabetes mellitus ([place])     Past Surgical History:  Procedure Laterality Date   ABDOMINAL HYSTERECTOMY     with right salpingo oophorectomy 2005   APPENDECTOMY   2006   BALLOON DILATION N/A 02/19/2021   Procedure: BALLOON DILATION;  Surgeon: Eloise Harman, DO;  [facility];  Service: Endoscopy;  Laterality: N/A;  Sigmoid colon stricture   BIOPSY  02/06/2021   Procedure: BIOPSY;  Surgeon: Eloise Harman, DO;  [facility];  Service: Endoscopy;;   BIOPSY  02/19/2021   Procedure: BIOPSY;  Surgeon: Eloise Harman, DO;  [facility];  Service: Endoscopy;;   BIOPSY  07/15/2021   Procedure: BIOPSY;  Surgeon: Rogene Houston, MD;  [facility];  Service: Endoscopy;;   CESAREAN SECTION     1996   CHOLECYSTECTOMY     COLONOSCOPY  2010   Dr. Gala Romney; negative except for hemorrhoids   ESOPHAGEAL DILATION N/A 10/25/2014   Procedure: ESOPHAGEAL DILATION;  Surgeon: Rogene Houston, MD;  [facility];  Service: Endoscopy;  Laterality: N/A;   ESOPHAGOGASTRODUODENOSCOPY N/A 10/25/2014   Procedure: ESOPHAGOGASTRODUODENOSCOPY (EGD);  Surgeon: Rogene Houston, MD;  [facility];  Service: Endoscopy;  Laterality: N/A;  [place] N/A 02/06/2021   Procedure: FLEXIBLE SIGMOIDOSCOPY;  Surgeon: Eloise Harman, DO;  [facility];  Service: Endoscopy;  Laterality: N/A;   FLEXIBLE SIGMOIDOSCOPY N/A 02/19/2021   Procedure: FLEXIBLE SIGMOIDOSCOPY;  Surgeon: Eloise Harman, DO;  [facility];  Service: Endoscopy;  Laterality: N/A;   OOPHORECTOMY     left  for torsion and ovarian fibroma; uterine myoma resected 1995   SIGMOIDOSCOPY  07/15/2021   Procedure: SIGMOIDOSCOPY;  Surgeon: Rogene Houston, MD;  [facility];  Service: Endoscopy;;    Current Outpatient Medications  Medication Sig Dispense Refill   acetaminophen (TYLENOL) 325 MG tablet Take 2 tablets (650 mg total) by mouth every 6 (six) hours as needed for mild pain or headache (or Fever >/= 101).     ALPRAZolam (XANAX) 1 MG tablet Take 1 mg by mouth 3 (three) times daily as needed for anxiety.      cetirizine (ZYRTEC) 10 MG tablet Take 10 mg by mouth daily.     DULoxetine (CYMBALTA) 30 MG capsule Take 30 mg by mouth daily.     feeding supplement (ENSURE ENLIVE / ENSURE PLUS) LIQD Take 237 mLs by mouth 2 (two) times daily between meals.     folic acid (FOLVITE) 1 MG tablet Take 1 tablet (1 mg total) by mouth daily.     hydrOXYzine (ATARAX) 25 MG tablet Take 25 mg by mouth 3 (three) times daily as needed for anxiety.     levETIRAcetam (KEPPRA) 750 MG tablet Take 750 mg by mouth 2 (two) times daily.     nitroGLYCERIN (NITROSTAT) 0.4 MG SL tablet Place 1 tablet (0.4 mg total) under the tongue every 5 (five) minutes as needed for chest pain. 100 tablet 0   ondansetron (ZOFRAN-ODT) 4 MG disintegrating tablet Take 4 mg by mouth every 8 (eight) hours as needed for nausea or vomiting.     pantoprazole (PROTONIX) 40 MG tablet Take 1 tablet (40 mg total) by mouth 2 (two) times daily. 60 tablet 2   RESTASIS 0.05 % ophthalmic emulsion Place 1 drop into both eyes 2 (two) times daily.     SPIRIVA RESPIMAT 2.5 MCG/ACT AERS Inhale 1 puff into the lungs daily. 4 g 3   Vitamin D, Ergocalciferol, (DRISDOL) 1.25 MG (50000 UNIT) CAPS capsule Take 50,000 Units by mouth once a week. Takes on Wednesdays.     amitriptyline (ELAVIL) 75 MG tablet Take 75 mg by mouth at bedtime. (Patient not taking: Reported on 02/23/2022)     No current facility-administered medications for this visit.   Allergies:  Codeine, Dilaudid [hydromorphone hcl], Hydrocodone, Morphine and related, Oxycodone, Penicillins, Acetaminophen, Ativan [lorazepam], Humira [adalimumab], Strawberry extract, Watermelon concentrate, Albuterol, Benadryl [diphenhydramine hcl], Latex, Remicade [infliximab], and Tramadol   ROS: No syncope.  Physical Exam: VS:  BP 114/72   Pulse (!) 107   Ht 5' 5.5" (1.664 m)   Wt 130 lb (59 kg)   SpO2 98%   BMI 21.30 kg/m , BMI Body mass index is 21.3 kg/m.  Wt Readings from Last 3 Encounters:  02/23/22 130 lb  (59 kg)  01/16/22 126 lb 5.2 oz (57.3 kg)  01/12/22 126 lb (57.2 kg)    General: Patient appears comfortable at rest. HEENT: Conjunctiva and lids normal. Neck: Supple, no elevated JVP or carotid bruits. Lungs: Clear to auscultation, nonlabored breathing at rest. Cardiac: Regular rate and rhythm, no S3 or significant systolic murmur. Extremities: No pitting edema.  ECG:  An ECG dated 01/15/2022 was personally reviewed today and demonstrated:  Sinus tachycardia with R' in lead V1 and V2, decreased R wave progression, possible left atrial enlargement.  Recent Labwork: 12/25/2021: TSH 0.832 01/16/2022: ALT 13; AST 10; BUN 11; Creatinine, Ser 0.59; Hemoglobin 9.8; Magnesium 2.2; Platelets 281; Potassium 3.7; Sodium 138     Component Value Date/Time   CHOL 152 05/10/2016  0519   TRIG 90 05/10/2016 0519   HDL 50 05/10/2016 0519   CHOLHDL 3.0 05/10/2016 0519   VLDL 18 05/10/2016 0519   LDLCALC 84 05/10/2016 0519    Other Studies Reviewed Today:  Exercise myocardial perfusion study 06/25/2020 ([facility]): STRESS ECG RESULTS:   The patient's baseline EKG is noted to show sinus rhythm with nonspecific ST segment changes.   The patient's EKG during monitoring period showed nonischemic upper sloping ST segment depression.   Stress EKG patient exercised for a total of 3 1/2 minutes and achieved 7 metabolic equivalents. They achieved 94% of the age-predicted maximum heart rate. No ischemic changes noted.   IMAGING FINDINGS:   This is a technically adequate study.   EDV: 22m  ESV: 334m  The stress gated images revealed left ventricular ejection fraction of 86%% with normal wall motion.   Comparison of stress and rest images revealed no reversible defects by myocardial perfusion imaging.   IMPRESSION:   1. No evidence of inducible ischemia.  2. Normal left ventricular function.  3. Fair exercise tolerance.   Echocardiogram 01/22/2021:  1. Left ventricular ejection fraction, by  estimation, is 60 to 65%. The  left ventricle has normal function.   2. Moderate pericardial effusion. The pericardial effusion is  circumferential. There is no evidence of cardiac tamponade. Effusion is  stable compared to the 01/19/21 study   3. The inferior vena cava is normal in size with greater than 50%  respiratory variability, suggesting right atrial pressure of 3 mmHg.   4. Limited echo to evaluate pericardial effusion   Assessment and Plan:  1.  Relatively fast resting heart rate with history of palpitations as discussed previously.  Cardiac monitor was not completed last year, this will be reordered as a 7-day ZIO XT.  2.  History of recurrent chest pain with no objective evidence of obstructive CAD based on prior testing.  Exercise Myoview in February of last year was low risk.  3.  History of pericardial effusion.  Follow-up limited echocardiogram ordered for reevaluation in comparison to study from September of last year.  Medication Adjustments/Labs and Tests Ordered: Current medicines are reviewed at length with the patient today.  Concerns regarding medicines are outlined above.   Tests Ordered: Orders Placed This Encounter  Procedures   ECHOCARDIOGRAM LIMITED    Medication Changes: No orders of the defined types were placed in this encounter.   Disposition:  Follow up  test results.  Signed, SaSatira SarkMD, FAUniversity Of Colorado Health At Memorial [facility] North0/24/2023 10:03 AM    [place] [address]hone: [phone]: [phone]

## 2022-02-23 ENCOUNTER — Encounter: Payer: Self-pay | Admitting: Cardiology

## 2022-02-23 ENCOUNTER — Ambulatory Visit: Payer: 59 | Attending: Cardiology | Admitting: Cardiology

## 2022-02-23 ENCOUNTER — Other Ambulatory Visit: Payer: Self-pay | Admitting: Cardiology

## 2022-02-23 ENCOUNTER — Ambulatory Visit: Payer: 59 | Attending: Cardiology

## 2022-02-23 ENCOUNTER — Telehealth: Payer: Self-pay | Admitting: Cardiology

## 2022-02-23 VITALS — BP 114/72 | HR 107 | Ht 65.5 in | Wt 130.0 lb

## 2022-02-23 DIAGNOSIS — R002 Palpitations: Secondary | ICD-10-CM | POA: Diagnosis not present

## 2022-02-23 DIAGNOSIS — I471 Supraventricular tachycardia, unspecified: Secondary | ICD-10-CM | POA: Diagnosis not present

## 2022-02-23 DIAGNOSIS — I3139 Other pericardial effusion (noninflammatory): Secondary | ICD-10-CM | POA: Diagnosis not present

## 2022-02-23 NOTE — Telephone Encounter (Signed)
PERCERT:  Limited Echo dx: F/u pericardial effusion    03-04-2022  7 DAY zio monitor

## 2022-02-23 NOTE — Patient Instructions (Addendum)
Medication Instructions:  Your physician recommends that you continue on your current medications as directed. Please refer to the Current Medication list given to you today.   Labwork: none  Testing/Procedures: Your physician has requested that you have an echocardiogram. Echocardiography is a painless test that uses sound waves to create images of your heart. It provides your doctor with information about the size and shape of your heart and how well your heart's chambers and valves are working. This procedure takes approximately one hour. There are no restrictions for this procedure. Please do NOT wear cologne, perfume, aftershave, or lotions (deodorant is allowed). Please arrive 15 minutes prior to your appointment time.   ZIO- Long Term Monitor Instructions   Your physician has requested you wear your ZIO patch monitor 7 days.   This is a single patch monitor.  [employer] supplies one patch monitor per enrollment.  Additional stickers are not available.   Please do not apply patch if you will be having a Nuclear Stress Test, Echocardiogram, Cardiac CT, MRI, or Chest Xray during the time frame you would be wearing the monitor. The patch cannot be worn during these tests.  You cannot remove and re-apply the ZIO XT patch monitor.   Your ZIO patch monitor will be sent USPS Priority mail from [facility] directly to your home address. The monitor may also be mailed to a PO BOX if home delivery is not available.   It may take 3-5 days to receive your monitor after you have been enrolled.   Once you have received you monitor, please review enclosed instructions.  Your monitor has already been registered assigning a specific monitor serial # to you.  Do not shower for the first 24 hours.  You may shower after the first 24 hours.   Press button if you feel a symptom. You will hear a small click.  Record Date, Time and Symptom in the Patient Log Book.   When you are ready to remove patch,  follow instructions on last 2 pages of Patient Log Book.  Stick patch monitor onto last page of Patient Log Book.   Place Patient Log Book in [place] box.  Use locking tab on box and tape box closed securely.  The [employer] has [employer] on it.  Please place in mailbox as soon as possible.  Your physician should have your test results approximately 7 days after the monitor has been mailed back to [facility].   Call [place] at [phone] if you have questions regarding your ZIO XT patch monitor.  Call them immediately if you see an orange light blinking on your monitor.   If your monitor falls off in less than 4 days contact our Monitor department at [phone].  If your monitor becomes loose or falls off after 4 days call [employer] at [phone] for suggestions on securing your monitor.   Follow-Up:  Your physician recommends that you schedule a follow-up appointment in: Follow Up Pending the results  Any Other Special Instructions Will Be Listed Below (If Applicable).  If you need a refill on your cardiac medications before your next appointment, please call your pharmacy.

## 2022-03-03 ENCOUNTER — Other Ambulatory Visit: Payer: 59

## 2022-03-04 ENCOUNTER — Encounter: Payer: Self-pay | Admitting: Internal Medicine

## 2022-03-09 DIAGNOSIS — R002 Palpitations: Secondary | ICD-10-CM | POA: Diagnosis not present

## 2022-03-10 ENCOUNTER — Ambulatory Visit: Payer: 59 | Attending: Cardiology

## 2022-03-10 DIAGNOSIS — I3139 Other pericardial effusion (noninflammatory): Secondary | ICD-10-CM

## 2022-03-11 ENCOUNTER — Telehealth: Payer: Self-pay | Admitting: *Deleted

## 2022-03-11 ENCOUNTER — Ambulatory Visit: Payer: 59 | Admitting: Internal Medicine

## 2022-03-11 ENCOUNTER — Encounter: Payer: Self-pay | Admitting: Internal Medicine

## 2022-03-11 VITALS — BP 132/78 | HR 118 | Temp 98.0°F | Ht 63.5 in | Wt 129.0 lb

## 2022-03-11 DIAGNOSIS — F17210 Nicotine dependence, cigarettes, uncomplicated: Secondary | ICD-10-CM | POA: Diagnosis not present

## 2022-03-11 DIAGNOSIS — R0609 Other forms of dyspnea: Secondary | ICD-10-CM

## 2022-03-11 DIAGNOSIS — R69 Illness, unspecified: Secondary | ICD-10-CM | POA: Diagnosis not present

## 2022-03-11 LAB — ECHOCARDIOGRAM LIMITED
Area-P 1/2: 14.31 cm2
Calc EF: 69.5 %
S' Lateral: 2.5 cm
Single Plane A2C EF: 74.8 %
Single Plane A4C EF: 62.3 %

## 2022-03-11 MED ORDER — METOPROLOL SUCCINATE ER 25 MG PO TB24: 12.5000 mg | ORAL_TABLET | Freq: Every day | ORAL | 1 refills | Status: DC

## 2022-03-11 NOTE — Assessment & Plan Note (Signed)
Counseled re importance of smoking cessation but did not meet time criteria for separate billing    Each maintenance medication was reviewed in detail including emphasizing most importantly the difference between maintenance and prns and under what circumstances the prns are to be triggered using an action plan format where appropriate.  Total time for H and P, chart review, counseling, reviewing smi device(s) , directly observing portions of ambulatory 02 saturation study/ and generating customized AVS unique to this office visit / same day charting  > 45 min new pt eval

## 2022-03-11 NOTE — Telephone Encounter (Signed)
-----   Message from Satira Sark, MD sent at 03/10/2022 10:00 AM EST ----- Results reviewed.  No cardiac arrhythmias, just rare PACs which are normal.  Average heart rate 102 bpm, so would consider a trial of Toprol-XL 12.5 mg daily to see if she feels better in terms of sense of palpitations.  Also need to follow-up on limited echocardiogram.

## 2022-03-11 NOTE — Progress Notes (Signed)
[name], female    DOB: [birth year]    MRN: [id number]   Brief patient profile:  72   yowf  active smoker with crohn's dz ? Since childhood  referred to pulmonary clinic in [place]  03/11/2022 by Teodoro Kil  for doe 1st aware of it in 2012  on spiriva smi worse since mid Oct 2023    History of Present Illness  03/11/2022  Pulmonary/ 1st office eval/ Anola Mcgough / Customer service manager Complaint  Patient presents with   Consult    SOB     Dyspnea:  mb and back 100 ft flat and has to stop going and coming most times. Also sob at rest/ helps to lie down or bend over and resolves s rx  Cough: more cough since onset of doe / sometime yellowish thick  Sleep: in recliner < 30 degrees = base;ome  SABA use: none / makes heart race as do laba's  02: none  Lung cancer screen: wants to think about it   No obvious day to day or daytime pattern/variability or assoc   mucus plugs or hemoptysis or cp or chest tightness, subjective wheeze or overt sinus or hb symptoms.   Sleeping  without nocturnal  or early am exacerbation  of respiratory  c/o's or need for noct saba. Also denies any obvious fluctuation of symptoms with weather or environmental changes or other aggravating or alleviating factors except as outlined above   No unusual exposure hx or h/o childhood pna/ asthma or knowledge of premature birth.  Current Allergies, Complete Past Medical History, Past Surgical History, Family History, and Social History were reviewed in [employer] record.  ROS  The following are not active complaints unless bolded Hoarseness, sore throat, dysphagia, dental problems, itching, sneezing,  nasal congestion or discharge of excess mucus or purulent secretions, ear ache,   fever, chills, sweats, unintended wt loss or wt gain, classically pleuritic or exertional cp,  orthopnea pnd or arm/hand swelling  or leg swelling, presyncope, palpitations, abdominal pain, anorexia, nausea,  vomiting, diarrhea  or change in bowel habits or change in bladder habits, change in stools or change in urine, dysuria, hematuria,  rash, arthralgias, visual complaints, headache, numbness, weakness or ataxia or problems with walking or coordination,  change in mood or  memory.           Past Medical History:  Diagnosis Date   Anxiety    Asthma    Chronic low back pain    Collagen vascular disease (HCC)    COPD (chronic obstructive pulmonary disease) (HCC)    Crohn's disease (HCC)    GERD (gastroesophageal reflux disease)    EGD 01/2007 by Dr.Rourke small hiatal hernia s/p 61 french maloney    History of head injury    Hypertension    IBS (irritable bowel syndrome)    PSVT (paroxysmal supraventricular tachycardia)    PTSD (post-traumatic stress disorder)    Recurrent chest pain    Seizure disorder (HCC)    Type 2 diabetes mellitus ([place])     Outpatient Medications Prior to Visit  Medication Sig Dispense Refill   acetaminophen (TYLENOL) 325 MG tablet Take 2 tablets (650 mg total) by mouth every 6 (six) hours as needed for mild pain or headache (or Fever >/= 101).     ALPRAZolam (XANAX) 1 MG tablet Take 1 mg by mouth 3 (three) times daily as needed for anxiety.     amitriptyline (ELAVIL) 75 MG tablet Take 75  mg by mouth at bedtime.     cetirizine (ZYRTEC) 10 MG tablet Take 10 mg by mouth daily.     DULoxetine (CYMBALTA) 30 MG capsule Take 30 mg by mouth daily.     feeding supplement (ENSURE ENLIVE / ENSURE PLUS) LIQD Take 237 mLs by mouth 2 (two) times daily between meals.     folic acid (FOLVITE) 1 MG tablet Take 1 tablet (1 mg total) by mouth daily.     hydrOXYzine (ATARAX) 25 MG tablet Take 25 mg by mouth 3 (three) times daily as needed for anxiety.     levETIRAcetam (KEPPRA) 750 MG tablet Take 750 mg by mouth 2 (two) times daily.     metoprolol succinate (TOPROL XL) 25 MG 24 hr tablet Take 0.5 tablets (12.5 mg total) by mouth daily. 45 tablet 1   nitroGLYCERIN (NITROSTAT) 0.4 MG  SL tablet Place 1 tablet (0.4 mg total) under the tongue every 5 (five) minutes as needed for chest pain. 100 tablet 0   ondansetron (ZOFRAN-ODT) 4 MG disintegrating tablet Take 4 mg by mouth every 8 (eight) hours as needed for nausea or vomiting.     pantoprazole (PROTONIX) 40 MG tablet Take 1 tablet (40 mg total) by mouth 2 (two) times daily. 60 tablet 2   RESTASIS 0.05 % ophthalmic emulsion Place 1 drop into both eyes 2 (two) times daily.     SPIRIVA RESPIMAT 2.5 MCG/ACT AERS Inhale 1 puff into the lungs daily. 4 g 3   Vitamin D, Ergocalciferol, (DRISDOL) 1.25 MG (50000 UNIT) CAPS capsule Take 50,000 Units by mouth once a week. Takes on Wednesdays.     No facility-administered medications prior to visit.     Objective:     BP 132/78 (BP Location: Left Arm)   Pulse (!) 118   Temp 98 F (36.7 C)   Ht 5' 3.5" (1.613 m)   Wt 129 lb (58.5 kg)   SpO2 97% Comment: ra  BMI 22.49 kg/m   SpO2: 97 % (ra)  Somber amb wf nad   HEENT : Oropharynx  clear/ top bridge     Nasal turbinates nl    NECK :  without  apparent JVD/ palpable Nodes/TM    LUNGS: no acc muscle use,  Nl contour chest which is clear to A and P bilaterally without cough on insp or exp maneuvers   CV:  RRR  no s3 or murmur or increase in P2, and no edema   ABD:  soft and nontender with nl inspiratory excursion in the supine position. No bruits or organomegaly appreciated   MS:  Nl gait/ ext warm without deformities Or obvious joint restrictions  calf tenderness, cyanosis or clubbing    SKIN: warm and dry without lesions    NEURO:  alert, approp, nl sensorium with  no motor or cerebellar deficits apparent.   CXR PA and Lateral:   03/11/2022 :    I personally reviewed images and agree with radiology impression as follows:    Did not go for cxr as rec     Assessment   DOE (dyspnea on exertion) Onset 2012 not necessarily proportionate to ex  - PFT's  06/19/14 no significant air flow obst/ variable truncation insp  loop only with nl exp curve - 03/11/2022  After extensive coaching inhaler device,  effectiveness =    90% with spiriva smi rec 2 puffs daily and sub max ex program with f/u PFTs when available  - 03/11/2022   Walked on RA  x  3  lap(s) =  approx 450   ft  @ mod to fast pace, stopped due to end of study  with lowest 02 sats 94% and sob on last lap    Symptoms are markedly disproportionate to objective findings and not clear to what extent this is actually a pulmonary  problem but pt does appear to have difficult to sort out respiratory symptoms of unknown origin for which  DDX  = almost all start with A and  include Adherence, Ace Inhibitors, Acid Reflux, Active Sinus Disease, Alpha 1 Antitripsin deficiency, Anxiety masquerading as Airways dz,  ABPA,  Allergy(esp in young), Aspiration (esp in elderly), Adverse effects of meds,  Active smoking or Vaping, A bunch of PE's/clot burden (a few small clots can't cause this syndrome unless there is already severe underlying pulm or vascular dz with poor reserve),  Anemia or thyroid disorder, plus two Bs  = Bronchiectasis and Beta blocker use..and one C= CHF    Adherence is always the initial "prime suspect" and is a multilayered concern that requires a "trust but verify" approach in every patient - starting with knowing how to use medications, especially inhalers, correctly, keeping up with refills and understanding the fundamental difference between maintenance and prns vs those medications only taken for a very short course and then stopped and not refilled.  - see smi teaching, should try full dose spiriva for at least a week and assess doe with fixed walk routine (MB and back) and if not better then ok to leave off and see what effect this has  Active smoker also at top of the list, discussed sep  ? Allergy /asthma > nothing to suggest this   ? Anxiety/depression/ deconditioning > usually at the bottom of this list of usual suspects but should be much higher  on this pt's based on H and P and note already on psychotropics and may interfere with adherence and also interpretation of response or lack thereof to symptom management which can be quite subjective >> defer to pcp  ? Anemia/ thyroid disorder > defer to PCP   ? Adverse effects of meds > none of the usual suspects listed   ? Chf > nothing to suggest   Cigarette smoker Counseled re importance of smoking cessation but did not meet time criteria for separate billing    Each maintenance medication was reviewed in detail including emphasizing most importantly the difference between maintenance and prns and under what circumstances the prns are to be triggered using an action plan format where appropriate.  Total time for H and P, chart review, counseling, reviewing smi device(s) , directly observing portions of ambulatory 02 saturation study/ and generating customized AVS unique to this office visit / same day charting  > 45 min new pt eval                   Christinia Gully, MD 03/11/2022

## 2022-03-11 NOTE — Telephone Encounter (Signed)
Patient informed and verbalized understanding of plan. Copy sent to PCP 

## 2022-03-11 NOTE — Assessment & Plan Note (Addendum)
Onset 2012 not necessarily proportionate to ex  - PFT's  06/19/14 no significant air flow obst/ variable truncation insp loop only with nl exp curve - 03/11/2022  After extensive coaching inhaler device,  effectiveness =    90% with spiriva smi rec 2 puffs daily and sub max ex program with f/u PFTs when available  - 03/11/2022   Walked on RA  x  3  lap(s) =  approx 450   ft  @ mod to fast pace, stopped due to end of study  with lowest 02 sats 94% and sob on last lap    Symptoms are markedly disproportionate to objective findings and not clear to what extent this is actually a pulmonary  problem but pt does appear to have difficult to sort out respiratory symptoms of unknown origin for which  DDX  = almost all start with A and  include Adherence, Ace Inhibitors, Acid Reflux, Active Sinus Disease, Alpha 1 Antitripsin deficiency, Anxiety masquerading as Airways dz,  ABPA,  Allergy(esp in young), Aspiration (esp in elderly), Adverse effects of meds,  Active smoking or Vaping, A bunch of PE's/clot burden (a few small clots can't cause this syndrome unless there is already severe underlying pulm or vascular dz with poor reserve),  Anemia or thyroid disorder, plus two Bs  = Bronchiectasis and Beta blocker use..and one C= CHF    Adherence is always the initial "prime suspect" and is a multilayered concern that requires a "trust but verify" approach in every patient - starting with knowing how to use medications, especially inhalers, correctly, keeping up with refills and understanding the fundamental difference between maintenance and prns vs those medications only taken for a very short course and then stopped and not refilled.  - see smi teaching, should try full dose spiriva for at least a week and assess doe with fixed walk routine (MB and back) and if not better then ok to leave off and see what effect this has  Active smoker also at top of the list, discussed sep  ? Allergy /asthma > nothing to suggest this    ? Anxiety/depression/ deconditioning > usually at the bottom of this list of usual suspects but should be much higher on this pt's based on H and P and note already on psychotropics and may interfere with adherence and also interpretation of response or lack thereof to symptom management which can be quite subjective >> defer to pcp  ? Anemia/ thyroid disorder > defer to PCP   ? Adverse effects of meds > none of the usual suspects listed   ? Chf > nothing to suggest

## 2022-03-11 NOTE — Patient Instructions (Addendum)
Try spiriva 2.5  x 2 puffs daily for at least a week to develop a good idea of whether it's helping you walk to mailbox and back and if not helping then ok to just leave it off and see what difference if any it makes   To get the most out of exercise, you need to be continuously aware that you are short of breath, but never out of breath, for at least 30 minutes daily. As you improve, it will actually be easier for you to do the same amount of exercise  in  30 minutes so always push to the level where you are short of breath.   My office will be contacting you by phone for referral to get PFT's  done next available and I will call you with results and set up follow up if needed  - if you don't hear back from my office within one week please call us back or notify us thru MyChart and we'll address it right away.    The key is to stop smoking completely before smoking completely stops you!

## 2022-03-12 ENCOUNTER — Encounter: Payer: Self-pay | Admitting: Internal Medicine

## 2022-03-24 ENCOUNTER — Encounter (INDEPENDENT_AMBULATORY_CARE_PROVIDER_SITE_OTHER): Payer: Self-pay | Admitting: Gastroenterology

## 2022-04-08 ENCOUNTER — Ambulatory Visit (INDEPENDENT_AMBULATORY_CARE_PROVIDER_SITE_OTHER): Payer: 59 | Admitting: Gastroenterology

## 2022-04-13 ENCOUNTER — Ambulatory Visit (INDEPENDENT_AMBULATORY_CARE_PROVIDER_SITE_OTHER): Payer: 59 | Admitting: Gastroenterology

## 2022-04-23 ENCOUNTER — Telehealth: Payer: Self-pay | Admitting: Cardiology

## 2022-04-23 MED ORDER — METOPROLOL SUCCINATE ER 25 MG PO TB24: 25.0000 mg | ORAL_TABLET | Freq: Every day | ORAL | 3 refills | Status: DC

## 2022-04-23 NOTE — Telephone Encounter (Signed)
Pt c/o medication issue:  1. Name of Medication:   metoprolol succinate (TOPROL XL) 25 MG 24 hr tablet    2. How are you currently taking this medication (dosage and times per day)?   Take 0.5 tablets (12.5 mg total) by mouth daily.    3. Are you having a reaction (difficulty breathing--STAT)? No  4. What is your medication issue? Pt would like to know if instructions can be changed to 1 tablet by mouth daily. She states that for the last 2 days she has been taking 1 tablet instead of 1/2 tablet and seems to work better. Please advise

## 2022-04-23 NOTE — Telephone Encounter (Signed)
Patient notified and verbalized understanding. 

## 2022-04-23 NOTE — Telephone Encounter (Signed)
Palpitations better with taking toprol 25 mg.   I will forward to [place] for rerview.

## 2022-06-14 ENCOUNTER — Ambulatory Visit: Payer: 59 | Admitting: Nurse Practitioner

## 2022-06-17 ENCOUNTER — Encounter: Payer: 59 | Admitting: Nurse Practitioner

## 2022-06-17 NOTE — Progress Notes (Signed)
 Cardiology Office Note:    Date:  06/17/2022   ID:  [name], DOB [birth year], MRN [id number]  PCP:  [name], PA-C   [facility] Providers Cardiologist:  Nona Dell, MD     Referring MD: [name], PA-C   No chief complaint on file.   History of Present Illness:    [name] is a 52 y.o. female with a hx of   PSVT Recurrent chest pain Hypertension Type 2 diabetes Collagen vascular disease Crohn's disease GERD COPD  Patient is a 52 year old female with past medical history as mentioned above.  Echocardiogram in September 2022 showed moderate size pericardial effusion.  Recent CT imaging in September 2023 showed trace to small size pericardial effusion.  Hospitalized in September 2023 with GI bleed, in setting of proctocolitis and Crohn's disease.  Admission in earlier September noted in setting of septic shock with C. difficile, E. coli UTI, acute renal insufficiency and right upper extremity DVT, treated with Eliquis and PICC line was removed.  It was recommended to treat with anticoagulation for 3 months.  Last seen by Dr. Diona Browner on February 23, 2022.  She reported fast resting heart rate and intermittent sense of palpitations at that time.  Since previous cardiac monitor was not completed last year, Dr. Diona Browner reordered a 7-day ZIO XT monitor.  Limited echocardiogram was ordered for comparison of pericardial effusion from the year before.  Monitor was negative for any arrhythmias, just rare PACs.  Average heart rate at the time was 102 bpm, Dr. Diona Browner suggested considering a trial of Toprol-XL 12.5 mg daily to see if this would help her symptoms.  Limited echo showed normal EF, pericardial effusion decreased in size, small, was recommended to arrange follow-up over the next 3 months.  Today she presents for 35-month follow-up.  She states the following... Past Medical History:  Diagnosis Date   Anxiety    Asthma    Chronic low back  pain    Collagen vascular disease (HCC)    COPD (chronic obstructive pulmonary disease) (HCC)    Crohn's disease (HCC)    GERD (gastroesophageal reflux disease)    EGD 01/2007 by Dr.Rourke small hiatal hernia s/p 56 french maloney    History of head injury    Hypertension    IBS (irritable bowel syndrome)    PSVT (paroxysmal supraventricular tachycardia)    PTSD (post-traumatic stress disorder)    Recurrent chest pain    Seizure disorder (HCC)    Type 2 diabetes mellitus (HCC)     Past Surgical History:  Procedure Laterality Date   ABDOMINAL HYSTERECTOMY     with right salpingo oophorectomy 2005   APPENDECTOMY  2006   BALLOON DILATION N/A 02/19/2021   Procedure: BALLOON DILATION;  Surgeon: Lanelle Bal, DO;  [facility];  Service: Endoscopy;  Laterality: N/A;  Sigmoid colon stricture   BIOPSY  02/06/2021   Procedure: BIOPSY;  Surgeon: Lanelle Bal, DO;  [facility];  Service: Endoscopy;;   BIOPSY  02/19/2021   Procedure: BIOPSY;  Surgeon: Lanelle Bal, DO;  [facility];  Service: Endoscopy;;   BIOPSY  07/15/2021   Procedure: BIOPSY;  Surgeon: Malissa Hippo, MD;  [facility];  Service: Endoscopy;;   CESAREAN SECTION     1996   CHOLECYSTECTOMY     COLONOSCOPY  2010   Dr. Jena Gauss; negative except for hemorrhoids   ESOPHAGEAL DILATION N/A 10/25/2014   Procedure: ESOPHAGEAL DILATION;  Surgeon: Malissa Hippo, MD;  [facility];  Service: Endoscopy;  Laterality: N/A;   ESOPHAGOGASTRODUODENOSCOPY N/A 10/25/2014   Procedure: ESOPHAGOGASTRODUODENOSCOPY (EGD);  Surgeon: Malissa Hippo, MD;  [facility];  Service: Endoscopy;  Laterality: N/A;  1250   FLEXIBLE SIGMOIDOSCOPY N/A 02/06/2021   Procedure: FLEXIBLE SIGMOIDOSCOPY;  Surgeon: Lanelle Bal, DO;  [facility];  Service: Endoscopy;  Laterality: N/A;   FLEXIBLE SIGMOIDOSCOPY N/A 02/19/2021   Procedure: FLEXIBLE SIGMOIDOSCOPY;  Surgeon:  Lanelle Bal, DO;  [facility];  Service: Endoscopy;  Laterality: N/A;   OOPHORECTOMY     left for torsion and ovarian fibroma; uterine myoma resected 1995   SIGMOIDOSCOPY  07/15/2021   Procedure: SIGMOIDOSCOPY;  Surgeon: Malissa Hippo, MD;  [facility];  Service: Endoscopy;;    Current Medications: No outpatient medications have been marked as taking for the 06/17/22 encounter (Appointment) with Sharlene Dory, NP.     Allergies:   Codeine, Dilaudid [hydromorphone hcl], Hydrocodone, Morphine and related, Oxycodone, Penicillins, Acetaminophen, Ativan [lorazepam], Humira [adalimumab], Strawberry extract, Watermelon concentrate, Albuterol, Benadryl [diphenhydramine hcl], Latex, Remicade [infliximab], and Tramadol   Social History   Socioeconomic History   Marital status: Divorced    Spouse name: Not on file   Number of children: Not on file   Years of education: Not on file   Highest education level: Not on file  Occupational History   Not on file  Tobacco Use   Smoking status: Some Days    Packs/day: 0.50    Years: 20.00    Total pack years: 10.00    Types: Cigarettes    Start date: 09/26/1984    Passive exposure: Current   Smokeless tobacco: Never  Vaping Use   Vaping Use: Never used  Substance and Sexual Activity   Alcohol use: No    Alcohol/week: 0.0 standard drinks of alcohol   Drug use: Not Currently   Sexual activity: Yes    Birth control/protection: Surgical  Other Topics Concern   Not on file  Social History Narrative   Not on file   Social Determinants of Health   Financial Resource Strain: Not on file  Food Insecurity: No Food Insecurity (01/16/2022)   Hunger Vital Sign    Worried About Running Out of Food in the Last Year: Never true    Ran Out of Food in the Last Year: Never true  Transportation Needs: No Transportation Needs (01/16/2022)   PRAPARE - Administrator, Civil Service (Medical): No    Lack of  Transportation (Non-Medical): No  Physical Activity: Not on file  Stress: Not on file  Social Connections: Not on file     Family History: The patient's family history includes Cancer in her mother; Cancer (age of onset: 8) in her father; Crohn's disease in her cousin; Diabetes in her father; Heart attack in her mother; Heart attack (age of onset: 41) in her father; Heart failure in her father and mother; Hypertension in her mother.  ROS:   Please see the history of present illness.     All other systems reviewed and are negative.  EKGs/Labs/Other Studies Reviewed:    The following studies were reviewed today:   EKG:  EKG is  ordered today.  The ekg ordered today demonstrates   Recent Labs: 12/25/2021: TSH 0.832 01/16/2022: ALT 13; BUN 11; Creatinine, Ser 0.59; Hemoglobin 9.8; Magnesium 2.2; Platelets 281; Potassium 3.7; Sodium 138  Recent Lipid Panel  Component Value Date/Time   CHOL 152 05/10/2016 0519   TRIG 90 05/10/2016 0519   HDL 50 05/10/2016 0519   CHOLHDL 3.0 05/10/2016 0519   VLDL 18 05/10/2016 0519   LDLCALC 84 05/10/2016 0519     Risk Assessment/Calculations:              Physical Exam:    VS:  There were no vitals taken for this visit.    Wt Readings from Last 3 Encounters:  03/11/22 129 lb (58.5 kg)  02/23/22 130 lb (59 kg)  01/16/22 126 lb 5.2 oz (57.3 kg)     GEN:  Well nourished, well developed in no acute distress HEENT: Normal NECK: No JVD; No carotid bruits LYMPHATICS: No lymphadenopathy CARDIAC: RRR, no murmurs, rubs, gallops RESPIRATORY:  Clear to auscultation without rales, wheezing or rhonchi  ABDOMEN: Soft, non-tender, non-distended MUSCULOSKELETAL:  No edema; No deformity  SKIN: Warm and dry NEUROLOGIC:  Alert and oriented x 3 PSYCHIATRIC:  Normal affect   ASSESSMENT:    No diagnosis found. PLAN:    In order of problems listed above:            Medication Adjustments/Labs and Tests Ordered: Current medicines are  reviewed at length with the patient today.  Concerns regarding medicines are outlined above.  No orders of the defined types were placed in this encounter.  No orders of the defined types were placed in this encounter.   There are no Patient Instructions on file for this visit.   SignedSharlene Dory, NP  06/17/2022 8:09 AM    [facility]

## 2022-07-06 ENCOUNTER — Ambulatory Visit: Payer: 59 | Admitting: Nurse Practitioner

## 2022-07-06 NOTE — Progress Notes (Deleted)
Cardiology Office Note:    Date:  07/06/2022   ID:  [name], DOB 31-Jul-1970, MRN [id number]  PCP:  Teodoro Kil, [place] Providers Cardiologist:  Rozann Lesches, MD { Click to update primary MD,subspecialty MD or APP then REFRESH:1}    Referring MD: Teodoro Kil, PA-C   No chief complaint on file. ***  History of Present Illness:    [name] is a 52 y.o. female with a hx of ***  PSVT Recurrent chest pain Hypertension Type 2 diabetes Collagen vascular disease Crohn's disease GERD COPD  Patient is a 52 year old female with past medical history as mentioned above.  Echocardiogram in September 2022 showed moderate size pericardial effusion.  Recent CT imaging in September 2023 showed trace to small size pericardial effusion.  Hospitalized in September 2023 with GI bleed, in setting of proctocolitis and Crohn's disease.  Admission in earlier September noted in setting of septic shock with C. difficile, E. coli UTI, acute renal insufficiency and right upper extremity DVT, treated with Eliquis and PICC line was removed.  It was recommended to treat with anticoagulation for 3 months.  Last seen by Dr. Domenic Polite on February 23, 2022.  She reported fast resting heart rate and intermittent sense of palpitations at that time.  Since previous cardiac monitor was not completed last year, Dr. Domenic Polite reordered a 7-day ZIO XT monitor.  Limited echocardiogram was ordered for comparison of pericardial effusion from the year before.  Monitor was negative for any arrhythmias, just rare PACs.  Average heart rate at the time was 102 bpm, Dr. Domenic Polite suggested considering a trial of Toprol-XL 12.5 mg daily to see if this would help her symptoms.  Limited echo showed normal EF, pericardial effusion decreased in size, small, was recommended to arrange follow-up over the next 3 months.  Today she presents for 9-monthfollow-up.  She states the following... Past  Medical History:  Diagnosis Date   Anxiety    Asthma    Chronic low back pain    Collagen vascular disease (HCC)    COPD (chronic obstructive pulmonary disease) (HCC)    Crohn's disease (HCC)    GERD (gastroesophageal reflux disease)    EGD 01/2007 by Dr.Rourke small hiatal hernia s/p 56 french maloney    History of head injury    Hypertension    IBS (irritable bowel syndrome)    PSVT (paroxysmal supraventricular tachycardia)    PTSD (post-traumatic stress disorder)    Recurrent chest pain    [place]iz[place] disorder [place]    Type 2 diabetes mellitus ([place]     Past Surgical History:  Procedure Laterality Date   ABDOMINAL HYSTERECTOMY     with right salpingo oophorectomy 2005   APPENDECTOMY  2006   BALLOON DILATION N/A 02/19/2021   Procedure: BALLOON DILATION;  Surgeon: CEloise Harman DO;  [facility];  Service: Endoscopy;  Laterality: N/A;  Sigmoid colon stricture   BIOPSY  02/06/2021   Procedure: BIOPSY;  Surgeon: CEloise Harman DO;  [facility];  Service: Endoscopy;;   BIOPSY  02/19/2021   Procedure: BIOPSY;  Surgeon: CEloise Harman DO;  [facility];  Service: Endoscopy;;   BIOPSY  07/15/2021   Procedure: BIOPSY;  Surgeon: RRogene Houston MD;  [facility];  Service: Endoscopy;;   CESAREAN SECTION     1996   CHOLECYSTECTOMY     COLONOSCOPY  2010   Dr. RGala Romney negative except for hemorrhoids  ESOPHAGEAL DILATION N/A 10/25/2014   Procedure: ESOPHAGEAL DILATION;  Surgeon: Rogene Houston, MD;  [facility];  Service: Endoscopy;  Laterality: N/A;   ESOPHAGOGASTRODUODENOSCOPY N/A 10/25/2014   Procedure: ESOPHAGOGASTRODUODENOSCOPY (EGD);  Surgeon: Rogene Houston, MD;  [facility];  Service: Endoscopy;  Laterality: N/A;  [place] N/A 02/06/2021   Procedure: FLEXIBLE SIGMOIDOSCOPY;  Surgeon: Eloise Harman, DO;  [facility];  Service: Endoscopy;  Laterality: N/A;   FLEXIBLE  SIGMOIDOSCOPY N/A 02/19/2021   Procedure: FLEXIBLE SIGMOIDOSCOPY;  Surgeon: Eloise Harman, DO;  [facility];  Service: Endoscopy;  Laterality: N/A;   OOPHORECTOMY     left for torsion and ovarian fibroma; uterine myoma resected 1995   SIGMOIDOSCOPY  07/15/2021   Procedure: SIGMOIDOSCOPY;  Surgeon: Rogene Houston, MD;  [facility];  Service: Endoscopy;;    Current Medications: No outpatient medications have been marked as taking for the 07/06/22 encounter (Appointment) with Finis Bud, NP.     Allergies:   Codeine, Dilaudid [hydromorphone hcl], Hydrocodone, Morphine and related, Oxycodone, Penicillins, Acetaminophen, Ativan [lorazepam], Humira [adalimumab], Strawberry extract, Watermelon concentrate, Albuterol, Benadryl [diphenhydramine hcl], Latex, Remicade [infliximab], and Tramadol   Social History   Socioeconomic History   Marital status: Divorced    Spouse name: Not on file   Number of children: Not on file   Years of education: Not on file   Highest education level: Not on file  Occupational History   Not on file  Tobacco Use   Smoking status: Some Days    Packs/day: 0.50    Years: 20.00    Total pack years: 10.00    Types: Cigarettes    Start date: 09/26/1984    Passive exposure: Current   Smokeless tobacco: Never  Vaping Use   Vaping Use: Never used  Substance and Sexual Activity   Alcohol use: No    Alcohol/week: 0.0 standard drinks of alcohol   Drug use: Not Currently   Sexual activity: Yes    Birth control/protection: Surgical  Other Topics Concern   Not on file  Social History Narrative   Not on file   Social Determinants of Health   Financial Resource Strain: Not on file  Food Insecurity: No Food Insecurity (01/16/2022)   Hunger Vital Sign    Worried About Running Out of Food in the Last Year: Never true    Ran Out of Food in the Last Year: Never true  Transportation Needs: No Transportation Needs (01/16/2022)   PRAPARE -  Hydrologist (Medical): No    Lack of Transportation (Non-Medical): No  Physical Activity: Not on file  Stress: Not on file  Social Connections: Not on file     Family History: The patient's ***family history includes Cancer in her mother; Cancer (age of onset: 58) in her father; Crohn's disease in her cousin; Diabetes in her father; Heart attack in her mother; Heart attack (age of onset: 33) in her father; Heart failure in her father and mother; Hypertension in her mother.  ROS:   Please see the history of present illness.    *** All other systems reviewed and are negative.  EKGs/Labs/Other Studies Reviewed:    The following studies were reviewed today: ***  EKG:  EKG is *** ordered today.  The ekg ordered today demonstrates ***  Recent Labs: 12/25/2021: TSH 0.832 01/16/2022: ALT 13; BUN 11; Creatinine, Ser 0.59; Hemoglobin 9.8; Magnesium 2.2; Platelets  281; Potassium 3.7; Sodium 138  Recent Lipid Panel    Component Value Date/Time   CHOL 152 05/10/2016 0519   TRIG 90 05/10/2016 0519   HDL 50 05/10/2016 0519   CHOLHDL 3.0 05/10/2016 0519   VLDL 18 05/10/2016 0519   LDLCALC 84 05/10/2016 0519     Risk Assessment/Calculations:   {Does this patient have ATRIAL FIBRILLATION?:[phone]}  No BP recorded.  {Refresh Note OR Click here to enter BP  :1}***         Physical Exam:    VS:  There were no vitals taken for this visit.    Wt Readings from Last 3 Encounters:  03/11/22 129 lb (58.5 kg)  02/23/22 130 lb (59 kg)  01/16/22 126 lb 5.2 oz (57.3 kg)     GEN: *** Well nourished, well developed in no acute distress HEENT: Normal NECK: No JVD; No carotid bruits LYMPHATICS: No lymphadenopathy CARDIAC: ***RRR, no murmurs, rubs, gallops RESPIRATORY:  Clear to auscultation without rales, wheezing or rhonchi  ABDOMEN: Soft, non-tender, non-distended MUSCULOSKELETAL:  No edema; No deformity  SKIN: Warm and dry NEUROLOGIC:  Alert and oriented x  3 PSYCHIATRIC:  Normal affect   ASSESSMENT:    No diagnosis found. PLAN:    In order of problems listed above:  ***      {Are you ordering a CV Procedure (e.g. stress test, cath, DCCV, TEE, etc)?   Press F2        :[id number]    Medication Adjustments/Labs and Tests Ordered: Current medicines are reviewed at length with the patient today.  Concerns regarding medicines are outlined above.  No orders of the defined types were placed in this encounter.  No orders of the defined types were placed in this encounter.   There are no Patient Instructions on file for this visit.   SignedFinis Bud, NP  07/06/2022 12:53 PM    [place]

## 2022-07-09 ENCOUNTER — Encounter (HOSPITAL_COMMUNITY): Payer: Self-pay

## 2022-07-09 ENCOUNTER — Emergency Department (HOSPITAL_COMMUNITY)
Admission: EM | Admit: 2022-07-09 | Discharge: 2022-07-09 | Disposition: A | Discharge status: Home / Self Care | Payer: Medicaid Other | Attending: Emergency Medicine | Admitting: Emergency Medicine

## 2022-07-09 DIAGNOSIS — E119 Type 2 diabetes mellitus without complications: Secondary | ICD-10-CM | POA: Insufficient documentation

## 2022-07-09 DIAGNOSIS — R109 Unspecified abdominal pain: Secondary | ICD-10-CM | POA: Diagnosis present

## 2022-07-09 DIAGNOSIS — N390 Urinary tract infection, site not specified: Secondary | ICD-10-CM | POA: Diagnosis not present

## 2022-07-09 DIAGNOSIS — J449 Chronic obstructive pulmonary disease, unspecified: Secondary | ICD-10-CM | POA: Insufficient documentation

## 2022-07-09 DIAGNOSIS — Z91040 Latex allergy status: Secondary | ICD-10-CM | POA: Insufficient documentation

## 2022-07-09 DIAGNOSIS — R319 Hematuria, unspecified: Secondary | ICD-10-CM | POA: Diagnosis not present

## 2022-07-09 LAB — COMPREHENSIVE METABOLIC PANEL
ALT: 18 U/L (ref 0–44)
AST: 13 U/L — ABNORMAL LOW (ref 15–41)
Albumin: 3.1 g/dL — ABNORMAL LOW (ref 3.5–5.0)
Alkaline Phosphatase: 100 U/L (ref 38–126)
Anion gap: 8 (ref 5–15)
BUN: 12 mg/dL (ref 6–20)
CO2: 29 mmol/L (ref 22–32)
Calcium: 9.2 mg/dL (ref 8.9–10.3)
Chloride: 101 mmol/L (ref 98–111)
Creatinine, Ser: 1.01 mg/dL — ABNORMAL HIGH (ref 0.44–1.00)
GFR, Estimated: >60 mL/min (ref >60)
Glucose, Bld: 89 mg/dL (ref 70–99)
Potassium: 4.2 mmol/L (ref 3.5–5.1)
Sodium: 138 mmol/L (ref 135–145)
Total Bilirubin: 0.2 mg/dL — ABNORMAL LOW (ref 0.3–1.2)
Total Protein: 6.4 g/dL — ABNORMAL LOW (ref 6.5–8.1)

## 2022-07-09 LAB — CBC WITH DIFFERENTIAL/PLATELET
Abs Immature Granulocytes: 0.21 10*3/uL — ABNORMAL HIGH (ref 0.00–0.07)
Basophils Absolute: 0.1 10*3/uL (ref 0.0–0.1)
Basophils Relative: 1 %
Eosinophils Absolute: 0.1 10*3/uL (ref 0.0–0.5)
Eosinophils Relative: 1 %
HCT: 39.4 % (ref 36.0–46.0)
Hemoglobin: 12.5 g/dL (ref 12.0–15.0)
Immature Granulocytes: 2 %
Lymphocytes Relative: 21 %
Lymphs Abs: 2.5 10*3/uL (ref 0.7–4.0)
MCH: 28.9 pg (ref 26.0–34.0)
MCHC: 31.7 g/dL (ref 30.0–36.0)
MCV: 91 fL (ref 80.0–100.0)
Monocytes Absolute: 0.5 10*3/uL (ref 0.1–1.0)
Monocytes Relative: 5 %
Neutro Abs: 8.3 10*3/uL — ABNORMAL HIGH (ref 1.7–7.7)
Neutrophils Relative %: 70 %
Platelets: 469 10*3/uL — ABNORMAL HIGH (ref 150–400)
RBC: 4.33 MIL/uL (ref 3.87–5.11)
RDW: 13.9 % (ref 11.5–15.5)
WBC: 11.7 10*3/uL — ABNORMAL HIGH (ref 4.0–10.5)
nRBC: 0 % (ref 0.0–0.2)

## 2022-07-09 LAB — URINALYSIS, ROUTINE W REFLEX MICROSCOPIC
Bilirubin Urine: NEGATIVE
Glucose, UA: NEGATIVE mg/dL
Ketones, ur: NEGATIVE mg/dL
Nitrite: NEGATIVE
Protein, ur: NEGATIVE mg/dL
Specific Gravity, Urine: 1.011 (ref 1.005–1.030)
pH: 6 (ref 5.0–8.0)

## 2022-07-09 LAB — TROPONIN I (HIGH SENSITIVITY)
Troponin I (High Sensitivity): 2 ng/L (ref <18)
Troponin I (High Sensitivity): 2 ng/L (ref <18)

## 2022-07-09 LAB — LIPASE, BLOOD: Lipase: 27 U/L (ref 11–51)

## 2022-07-09 MED ORDER — SODIUM CHLORIDE 0.9 % IV BOLUS
1000.0000 mL | Freq: Once | INTRAVENOUS | Status: AC
  Administered 2022-07-09: 1000 mL via INTRAVENOUS

## 2022-07-09 MED ORDER — ONDANSETRON HCL 4 MG/2ML IJ SOLN
4.0000 mg | Freq: Once | INTRAMUSCULAR | Status: AC
  Administered 2022-07-09: 4 mg via INTRAVENOUS
  Filled 2022-07-09: qty 2

## 2022-07-09 MED ORDER — FENTANYL CITRATE PF 50 MCG/ML IJ SOSY
50.0000 ug | PREFILLED_SYRINGE | Freq: Once | INTRAMUSCULAR | Status: AC
  Administered 2022-07-09: 50 ug via INTRAVENOUS
  Filled 2022-07-09: qty 1

## 2022-07-09 MED ORDER — LEVOFLOXACIN 500 MG PO TABS: 500.0000 mg | ORAL_TABLET | Freq: Every day | ORAL | 0 refills | Status: AC

## 2022-07-09 MED ORDER — KETOROLAC TROMETHAMINE 15 MG/ML IJ SOLN
15.0000 mg | Freq: Once | INTRAMUSCULAR | Status: AC
  Administered 2022-07-09: 15 mg via INTRAVENOUS
  Filled 2022-07-09: qty 1

## 2022-07-09 MED ORDER — LEVOFLOXACIN 500 MG PO TABS
500.0000 mg | ORAL_TABLET | Freq: Once | ORAL | Status: AC
  Administered 2022-07-09: 500 mg via ORAL
  Filled 2022-07-09: qty 1

## 2022-07-09 NOTE — ED Triage Notes (Signed)
Pt c/o lower abdominal pain ongoing since August. Pt c/o N/V/D, states that she's been unable to control with zofran. Pt also states she's had intermittent CP, as recent as last night. Last BM earlier today.

## 2022-07-09 NOTE — Discharge Instructions (Signed)
Evaluation today revealed that you still likely have a UTI.  We will extend your course of Levaquin for another 4 days and advise you to follow-up with PCP for reevaluation and possible recheck of your urine.  If your symptoms improve likely indicates that your UTI has resolved.

## 2022-07-09 NOTE — ED Notes (Signed)
See triage notes, recently finished abx for UTI. Pt alert and oriented, color WNL, tenderness to lower stomach. No swelling noted, mucus membranes wet.

## 2022-07-09 NOTE — ED Provider Notes (Signed)
[facility] Provider Note   CSN: [id number] Arrival date & time: 07/09/22  1112     History  Chief Complaint  Patient presents with   Abdominal Pain   HPI Milaya Novosel is a 52 y.o. female with Crohn's, IBS, COPD, seizures, type 2 diabetes and SVT presenting for abdominal pain.  Has been going on since September but symptoms have worsened in the last couple weeks.  Pain is located in the lower abdomen and radiates to the back at times.  It feels sharp.  It is intermittent.  States she started to have nausea vomiting diarrhea since yesterday endorses bright red blood in her stool.  Also endorsing dysuria but no other urinary symptoms.  Also started to have chest pain yesterday with shortness of breath.  Chest pain is left-sided and radiates to the back.  It is worse with exertion.  No calf tenderness or recent immobilization.  Was admitted to [facility] a couple weeks ago for this lower abdominal pain.  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  Started on Levaquin in the hospital and discharged on oral antibiotics.  Patient states that she was compliant with this medication and finished her course about a week ago.  Patient status post appendectomy, cholecystectomy and hysterectomy.  Abdominal Pain Associated symptoms: chest pain        Home Medications Prior to Admission medications   Medication Sig Start Date End Date Taking? Authorizing Provider  acetaminophen (TYLENOL) 325 MG tablet Take 2 tablets (650 mg total) by mouth every 6 (six) hours as needed for mild pain or headache (or Fever >/= 101). 01/03/22   [name], MD  ALPRAZolam [name]) 1 MG tablet Take 1 mg by mouth 3 (three) times daily as needed for anxiety. 07/11/19   [provider]  amitriptyline (ELAVIL) 75 MG tablet Take 75 mg by mouth at bedtime. 12/16/20   [provider]  cetirizine (ZYRTEC) 10 MG tablet Take 10 mg by mouth daily. 01/08/22    [provider]  DULoxetine (CYMBALTA) 30 MG capsule Take 30 mg by mouth daily. 12/17/21   [provider]  feeding supplement (ENSURE ENLIVE / ENSURE PLUS) LIQD Take 237 mLs by mouth 2 (two) times daily between meals. 01/04/22   [name], MD  folic acid (FOLVITE) 1 MG tablet Take 1 tablet (1 mg total) by mouth daily. 10/03/21   Orson Eva, MD  hydrOXYzine (ATARAX) 25 MG tablet Take 25 mg by mouth 3 (three) times daily as needed for anxiety. 12/13/21   [provider]  levETIRAcetam (KEPPRA) 750 MG tablet Take 750 mg by mouth 2 (two) times daily. 09/30/20   [provider]  metoprolol succinate (TOPROL XL) 25 MG 24 hr tablet Take 1 tablet (25 mg total) by mouth daily. 04/23/22   Satira Sark, MD  nitroGLYCERIN (NITROSTAT) 0.4 MG SL tablet Place 1 tablet (0.4 mg total) under the tongue every 5 (five) minutes as needed for chest pain. 03/19/21   Rehman, Mechele Dawley, MD  ondansetron (ZOFRAN-ODT) 4 MG disintegrating tablet Take 4 mg by mouth every 8 (eight) hours as needed for nausea or vomiting. 09/07/21   [provider]  pantoprazole (PROTONIX) 40 MG tablet Take 1 tablet (40 mg total) by mouth 2 (two) times daily. 01/03/22   [name], MD  RESTASIS 0.05 % ophthalmic emulsion Place 1 drop into both eyes 2 (two) times daily. 12/12/20   [provider]  [place]  2.5 MCG/ACT AERS Inhale 1 puff into the lungs daily. 01/03/22   [name], MD  Vitamin D, Ergocalciferol, (DRISDOL) 1.25 MG (50000 UNIT) CAPS capsule Take 50,000 Units by mouth once a week. Takes on Wednesdays. 12/14/21   [provider]      Allergies    Codeine, Dilaudid [hydromorphone hcl], Hydrocodone, Morphine and related, Oxycodone, Penicillins, Acetaminophen, Ativan [lorazepam], Humira [adalimumab], Strawberry extract, Watermelon concentrate, Albuterol, Benadryl [diphenhydramine hcl], Latex, Remicade [infliximab], and Tramadol    Review of Systems   Review of  Systems  Cardiovascular:  Positive for chest pain.  Gastrointestinal:  Positive for abdominal pain.    Physical Exam Updated Vital Signs BP 107/66 (BP Location: Left Arm)   Pulse 88   Temp 97.7 F (36.5 C) (Oral)   Resp 16   SpO2 99%  Physical Exam Vitals and nursing note reviewed.  HENT:     Head: Normocephalic and atraumatic.     Mouth/Throat:     Mouth: Mucous membranes are moist.  Eyes:     General:        Right eye: No discharge.        Left eye: No discharge.     Conjunctiva/sclera: Conjunctivae normal.  Cardiovascular:     Rate and Rhythm: Normal rate and regular rhythm.     Pulses: Normal pulses.     Heart sounds: Normal heart sounds.  Pulmonary:     Effort: Pulmonary effort is normal.     Breath sounds: Normal breath sounds.  Abdominal:     General: Abdomen is flat.     Palpations: Abdomen is soft.     Tenderness: There is abdominal tenderness in the right lower quadrant, suprapubic area and left lower quadrant.  Skin:    General: Skin is warm and dry.  Neurological:     General: No focal deficit present.  Psychiatric:        Mood and Affect: Mood normal.     ED Results / Procedures / Treatments   Labs (all labs ordered are listed, but only abnormal results are displayed) Labs Reviewed  CBC WITH DIFFERENTIAL/PLATELET - Abnormal; Notable for the following components:      Result Value   WBC 11.7 (*)    Platelets 469 (*)    Neutro Abs 8.3 (*)    Abs Immature Granulocytes 0.21 (*)    All other components within normal limits  COMPREHENSIVE METABOLIC PANEL - Abnormal; Notable for the following components:   Creatinine, Ser 1.01 (*)    Total Protein 6.4 (*)    Albumin 3.1 (*)    AST 13 (*)    Total Bilirubin 0.2 (*)    All other components within normal limits  URINALYSIS, ROUTINE W REFLEX MICROSCOPIC - Abnormal; Notable for the following components:   APPearance HAZY (*)    Hgb urine dipstick SMALL (*)    Leukocytes,Ua SMALL (*)    Bacteria, UA  RARE (*)    All other components within normal limits  LIPASE, BLOOD  URINALYSIS, W/ REFLEX TO CULTURE (INFECTION SUSPECTED)  TROPONIN I (HIGH SENSITIVITY)  TROPONIN I (HIGH SENSITIVITY)    EKG None  Radiology No results found.  Procedures Procedures    Medications Ordered in ED Medications  ondansetron (ZOFRAN) injection 4 mg (4 mg Intravenous Given 07/09/22 1357)  fentaNYL (SUBLIMAZE) injection 50 mcg (50 mcg Intravenous Given 07/09/22 1358)  sodium chloride 0.9 % bolus 1,000 mL (0 mLs Intravenous Stopped 07/09/22 1443)  ketorolac (TORADOL) 15 MG/ML injection 15  mg (15 mg Intravenous Given 07/09/22 1519)  levofloxacin (LEVAQUIN) tablet 500 mg (500 mg Oral Given 07/09/22 1554)    ED Course/ Medical Decision Making/ A&P                             Medical Decision Making Amount and/or Complexity of Data Reviewed Labs: ordered.  Risk Prescription drug management.   52 year old female who is well-appearing and hemodynamically stable presenting for abdominal pain and chest pain.  Exam notable for lower abdominal tenderness but otherwise reassuring.  DDx includes diverticulitis, acute cystitis, nephrolithiasis, symptomatic anemia DDx for chest pain includes ACS, PE, pneumothorax and CHF exacerbation.  Reviewed her chart, last CT of her abdomen pelvis was mostly unremarkable did show emphysematous cystitis.  Was treated with a course of Levaquin.  UA was positive for leukocytes and bacteria.  Patient did endorse dysuria.  I extended her Levaquin course for another 4 days.  Treated her pain with Fentanyl and Tylenol and toradol. Treated nausea with Zofran.  Patient felt much better after treatment.  Advised her to follow-up with her PCP for ongoing lower abdominal pain and UTI.  Discussed return precautions.          Final Clinical Impression(s) / ED Diagnoses Final diagnoses:  Abdominal pain, unspecified abdominal location  Urinary tract infection with hematuria, site unspecified     Rx / DC Orders ED Discharge Orders     None         Harriet Pho, PA-C 07/09/22 1559    Varney Biles, MD 07/10/22 1318

## 2022-11-13 ENCOUNTER — Emergency Department (HOSPITAL_COMMUNITY)
Admission: EM | Admit: 2022-11-13 | Discharge: 2022-11-13 | Discharge status: Against Medical Advice | Payer: Medicaid Other | Attending: Emergency Medicine | Admitting: Emergency Medicine

## 2022-11-13 ENCOUNTER — Encounter (HOSPITAL_COMMUNITY): Payer: Self-pay

## 2022-11-13 ENCOUNTER — Other Ambulatory Visit: Payer: Self-pay

## 2022-11-13 DIAGNOSIS — Z5321 Procedure and treatment not carried out due to patient leaving prior to being seen by health care provider: Secondary | ICD-10-CM | POA: Diagnosis not present

## 2022-11-13 DIAGNOSIS — R112 Nausea with vomiting, unspecified: Secondary | ICD-10-CM | POA: Diagnosis not present

## 2022-11-13 DIAGNOSIS — R197 Diarrhea, unspecified: Secondary | ICD-10-CM | POA: Insufficient documentation

## 2022-11-13 DIAGNOSIS — R109 Unspecified abdominal pain: Secondary | ICD-10-CM | POA: Diagnosis present

## 2022-11-13 MED ORDER — ONDANSETRON 8 MG PO TBDP
8.0000 mg | ORAL_TABLET | Freq: Once | ORAL | Status: AC
  Administered 2022-11-13: 8 mg via ORAL
  Filled 2022-11-13: qty 1

## 2022-11-13 NOTE — ED Provider Triage Note (Signed)
Emergency Medicine Provider Triage Evaluation Note  [name] , a 52 y.o. female  was evaluated in triage.  Pt complains of abdominal pain, nausea, vomiting, and diarrhea.  Patient reports history of Crohn's disease with pain at baseline with worsening of pain this morning.  Reports chronic diarrhea for the past 2 years.  Reports episodes of emesis per the past several months with 1 episode this morning.  Denies any fever, chills, hematemesis, urinary symptoms, vaginal symptoms, medic easier/melena..   Review of Systems  Positive: See above Negative:   Physical Exam  BP 131/61   Pulse (!) 116   Temp 97.9 F (36.6 C)   Resp 16   Ht 5\' 3"  (1.6 m)   Wt 63.5 kg   SpO2 96%   BMI 24.80 kg/m  Gen:   Awake, no distress   Resp:  Normal effort  MSK:   Moves extremities without difficulty  Other:  Abdominal tenderness to palpation  Medical Decision Making  Medically screening exam initiated at 4:41 PM.  Appropriate orders placed.  [name] was informed that the remainder of the evaluation will be completed by another provider, this initial triage assessment does not replace that evaluation, and the importance of remaining in the ED until their evaluation is complete.     Peter Garter, [place] 11/13/22 1643

## 2022-11-13 NOTE — ED Triage Notes (Signed)
EMS reports  Abdomen pain  Hx Chron's  Normally pain at baseline Worsening pain this morning Vomiting Started this morning One episode

## 2023-01-27 ENCOUNTER — Emergency Department (HOSPITAL_COMMUNITY): Payer: Medicaid Other

## 2023-01-27 ENCOUNTER — Inpatient Hospital Stay (HOSPITAL_COMMUNITY)
Admission: EM | Admit: 2023-01-27 | Discharge: 2023-03-19 | DRG: 853 | Disposition: A | Discharge status: Home Health | Payer: Medicaid Other | Attending: Internal Medicine | Admitting: Internal Medicine

## 2023-01-27 ENCOUNTER — Inpatient Hospital Stay (HOSPITAL_COMMUNITY): Payer: Medicaid Other

## 2023-01-27 DIAGNOSIS — K219 Gastro-esophageal reflux disease without esophagitis: Secondary | ICD-10-CM | POA: Diagnosis present

## 2023-01-27 DIAGNOSIS — Z87820 Personal history of traumatic brain injury: Secondary | ICD-10-CM

## 2023-01-27 DIAGNOSIS — E861 Hypovolemia: Secondary | ICD-10-CM | POA: Diagnosis present

## 2023-01-27 DIAGNOSIS — K6289 Other specified diseases of anus and rectum: Secondary | ICD-10-CM | POA: Diagnosis not present

## 2023-01-27 DIAGNOSIS — M7989 Other specified soft tissue disorders: Secondary | ICD-10-CM | POA: Diagnosis not present

## 2023-01-27 DIAGNOSIS — B49 Unspecified mycosis: Secondary | ICD-10-CM | POA: Diagnosis not present

## 2023-01-27 DIAGNOSIS — K560 Paralytic ileus: Secondary | ICD-10-CM | POA: Diagnosis not present

## 2023-01-27 DIAGNOSIS — D72825 Bandemia: Secondary | ICD-10-CM | POA: Diagnosis not present

## 2023-01-27 DIAGNOSIS — Z8616 Personal history of COVID-19: Secondary | ICD-10-CM | POA: Diagnosis not present

## 2023-01-27 DIAGNOSIS — I330 Acute and subacute infective endocarditis: Secondary | ICD-10-CM | POA: Diagnosis not present

## 2023-01-27 DIAGNOSIS — E876 Hypokalemia: Secondary | ICD-10-CM | POA: Diagnosis not present

## 2023-01-27 DIAGNOSIS — K920 Hematemesis: Secondary | ICD-10-CM | POA: Diagnosis present

## 2023-01-27 DIAGNOSIS — F419 Anxiety disorder, unspecified: Secondary | ICD-10-CM | POA: Diagnosis present

## 2023-01-27 DIAGNOSIS — E43 Unspecified severe protein-calorie malnutrition: Secondary | ICD-10-CM

## 2023-01-27 DIAGNOSIS — G9341 Metabolic encephalopathy: Secondary | ICD-10-CM | POA: Diagnosis present

## 2023-01-27 DIAGNOSIS — I2699 Other pulmonary embolism without acute cor pulmonale: Secondary | ICD-10-CM

## 2023-01-27 DIAGNOSIS — Z880 Allergy status to penicillin: Secondary | ICD-10-CM

## 2023-01-27 DIAGNOSIS — A498 Other bacterial infections of unspecified site: Secondary | ICD-10-CM | POA: Diagnosis not present

## 2023-01-27 DIAGNOSIS — Z886 Allergy status to analgesic agent status: Secondary | ICD-10-CM

## 2023-01-27 DIAGNOSIS — E1122 Type 2 diabetes mellitus with diabetic chronic kidney disease: Secondary | ICD-10-CM | POA: Diagnosis not present

## 2023-01-27 DIAGNOSIS — Z751 Person awaiting admission to adequate facility elsewhere: Secondary | ICD-10-CM

## 2023-01-27 DIAGNOSIS — Z90710 Acquired absence of both cervix and uterus: Secondary | ICD-10-CM

## 2023-01-27 DIAGNOSIS — I3139 Other pericardial effusion (noninflammatory): Secondary | ICD-10-CM | POA: Diagnosis not present

## 2023-01-27 DIAGNOSIS — D735 Infarction of spleen: Secondary | ICD-10-CM | POA: Diagnosis not present

## 2023-01-27 DIAGNOSIS — J9601 Acute respiratory failure with hypoxia: Secondary | ICD-10-CM | POA: Diagnosis present

## 2023-01-27 DIAGNOSIS — I10 Essential (primary) hypertension: Secondary | ICD-10-CM | POA: Diagnosis present

## 2023-01-27 DIAGNOSIS — K90829 Short bowel syndrome, unspecified: Secondary | ICD-10-CM | POA: Diagnosis not present

## 2023-01-27 DIAGNOSIS — B371 Pulmonary candidiasis: Secondary | ICD-10-CM | POA: Diagnosis not present

## 2023-01-27 DIAGNOSIS — K50118 Crohn's disease of large intestine with other complication: Secondary | ICD-10-CM | POA: Insufficient documentation

## 2023-01-27 DIAGNOSIS — A4189 Other specified sepsis: Secondary | ICD-10-CM | POA: Diagnosis present

## 2023-01-27 DIAGNOSIS — M545 Low back pain, unspecified: Secondary | ICD-10-CM | POA: Diagnosis present

## 2023-01-27 DIAGNOSIS — R34 Anuria and oliguria: Secondary | ICD-10-CM | POA: Diagnosis present

## 2023-01-27 DIAGNOSIS — I76 Septic arterial embolism: Secondary | ICD-10-CM | POA: Diagnosis not present

## 2023-01-27 DIAGNOSIS — K651 Peritoneal abscess: Secondary | ICD-10-CM | POA: Diagnosis present

## 2023-01-27 DIAGNOSIS — K922 Gastrointestinal hemorrhage, unspecified: Secondary | ICD-10-CM

## 2023-01-27 DIAGNOSIS — R7989 Other specified abnormal findings of blood chemistry: Secondary | ICD-10-CM | POA: Diagnosis not present

## 2023-01-27 DIAGNOSIS — R1312 Dysphagia, oropharyngeal phase: Secondary | ICD-10-CM | POA: Diagnosis present

## 2023-01-27 DIAGNOSIS — K5090 Crohn's disease, unspecified, without complications: Secondary | ICD-10-CM

## 2023-01-27 DIAGNOSIS — Z9049 Acquired absence of other specified parts of digestive tract: Secondary | ICD-10-CM

## 2023-01-27 DIAGNOSIS — Z1152 Encounter for screening for COVID-19: Secondary | ICD-10-CM | POA: Diagnosis not present

## 2023-01-27 DIAGNOSIS — J4489 Other specified chronic obstructive pulmonary disease: Secondary | ICD-10-CM | POA: Diagnosis present

## 2023-01-27 DIAGNOSIS — R571 Hypovolemic shock: Secondary | ICD-10-CM | POA: Diagnosis present

## 2023-01-27 DIAGNOSIS — K567 Ileus, unspecified: Secondary | ICD-10-CM | POA: Diagnosis not present

## 2023-01-27 DIAGNOSIS — R6521 Severe sepsis with septic shock: Secondary | ICD-10-CM | POA: Diagnosis present

## 2023-01-27 DIAGNOSIS — D75839 Thrombocytosis, unspecified: Secondary | ICD-10-CM | POA: Diagnosis not present

## 2023-01-27 DIAGNOSIS — Z7984 Long term (current) use of oral hypoglycemic drugs: Secondary | ICD-10-CM

## 2023-01-27 DIAGNOSIS — D638 Anemia in other chronic diseases classified elsewhere: Secondary | ICD-10-CM | POA: Diagnosis present

## 2023-01-27 DIAGNOSIS — J95811 Postprocedural pneumothorax: Secondary | ICD-10-CM | POA: Diagnosis not present

## 2023-01-27 DIAGNOSIS — F4310 Post-traumatic stress disorder, unspecified: Secondary | ICD-10-CM | POA: Diagnosis present

## 2023-01-27 DIAGNOSIS — E871 Hypo-osmolality and hyponatremia: Secondary | ICD-10-CM | POA: Diagnosis present

## 2023-01-27 DIAGNOSIS — I129 Hypertensive chronic kidney disease with stage 1 through stage 4 chronic kidney disease, or unspecified chronic kidney disease: Secondary | ICD-10-CM | POA: Diagnosis not present

## 2023-01-27 DIAGNOSIS — E8809 Other disorders of plasma-protein metabolism, not elsewhere classified: Secondary | ICD-10-CM | POA: Diagnosis present

## 2023-01-27 DIAGNOSIS — I468 Cardiac arrest due to other underlying condition: Secondary | ICD-10-CM | POA: Diagnosis present

## 2023-01-27 DIAGNOSIS — E860 Dehydration: Secondary | ICD-10-CM | POA: Diagnosis present

## 2023-01-27 DIAGNOSIS — F05 Delirium due to known physiological condition: Secondary | ICD-10-CM | POA: Diagnosis not present

## 2023-01-27 DIAGNOSIS — K5010 Crohn's disease of large intestine without complications: Secondary | ICD-10-CM | POA: Diagnosis not present

## 2023-01-27 DIAGNOSIS — K56699 Other intestinal obstruction unspecified as to partial versus complete obstruction: Secondary | ICD-10-CM | POA: Diagnosis present

## 2023-01-27 DIAGNOSIS — K760 Fatty (change of) liver, not elsewhere classified: Secondary | ICD-10-CM | POA: Diagnosis present

## 2023-01-27 DIAGNOSIS — D62 Acute posthemorrhagic anemia: Secondary | ICD-10-CM | POA: Diagnosis not present

## 2023-01-27 DIAGNOSIS — I361 Nonrheumatic tricuspid (valve) insufficiency: Secondary | ICD-10-CM | POA: Diagnosis not present

## 2023-01-27 DIAGNOSIS — Z6823 Body mass index (BMI) 23.0-23.9, adult: Secondary | ICD-10-CM

## 2023-01-27 DIAGNOSIS — Z888 Allergy status to other drugs, medicaments and biological substances status: Secondary | ICD-10-CM

## 2023-01-27 DIAGNOSIS — K559 Vascular disorder of intestine, unspecified: Secondary | ICD-10-CM | POA: Diagnosis present

## 2023-01-27 DIAGNOSIS — R64 Cachexia: Secondary | ICD-10-CM | POA: Diagnosis present

## 2023-01-27 DIAGNOSIS — R519 Headache, unspecified: Secondary | ICD-10-CM | POA: Diagnosis not present

## 2023-01-27 DIAGNOSIS — R579 Shock, unspecified: Secondary | ICD-10-CM | POA: Diagnosis present

## 2023-01-27 DIAGNOSIS — D75838 Other thrombocytosis: Secondary | ICD-10-CM | POA: Diagnosis not present

## 2023-01-27 DIAGNOSIS — Z9102 Food additives allergy status: Secondary | ICD-10-CM

## 2023-01-27 DIAGNOSIS — F329 Major depressive disorder, single episode, unspecified: Secondary | ICD-10-CM | POA: Diagnosis present

## 2023-01-27 DIAGNOSIS — N189 Chronic kidney disease, unspecified: Secondary | ICD-10-CM | POA: Diagnosis not present

## 2023-01-27 DIAGNOSIS — K529 Noninfective gastroenteritis and colitis, unspecified: Secondary | ICD-10-CM | POA: Diagnosis not present

## 2023-01-27 DIAGNOSIS — L8990 Pressure ulcer of unspecified site, unspecified stage: Secondary | ICD-10-CM | POA: Insufficient documentation

## 2023-01-27 DIAGNOSIS — G8918 Other acute postprocedural pain: Secondary | ICD-10-CM | POA: Diagnosis not present

## 2023-01-27 DIAGNOSIS — G4700 Insomnia, unspecified: Secondary | ICD-10-CM | POA: Diagnosis present

## 2023-01-27 DIAGNOSIS — K50112 Crohn's disease of large intestine with intestinal obstruction: Secondary | ICD-10-CM | POA: Diagnosis not present

## 2023-01-27 DIAGNOSIS — R000 Tachycardia, unspecified: Secondary | ICD-10-CM | POA: Diagnosis not present

## 2023-01-27 DIAGNOSIS — B377 Candidal sepsis: Secondary | ICD-10-CM | POA: Diagnosis not present

## 2023-01-27 DIAGNOSIS — Z5941 Food insecurity: Secondary | ICD-10-CM

## 2023-01-27 DIAGNOSIS — R791 Abnormal coagulation profile: Secondary | ICD-10-CM | POA: Diagnosis present

## 2023-01-27 DIAGNOSIS — I4711 Inappropriate sinus tachycardia, so stated: Secondary | ICD-10-CM | POA: Diagnosis not present

## 2023-01-27 DIAGNOSIS — E1142 Type 2 diabetes mellitus with diabetic polyneuropathy: Secondary | ICD-10-CM | POA: Diagnosis present

## 2023-01-27 DIAGNOSIS — Z7901 Long term (current) use of anticoagulants: Secondary | ICD-10-CM

## 2023-01-27 DIAGNOSIS — G40909 Epilepsy, unspecified, not intractable, without status epilepticus: Secondary | ICD-10-CM | POA: Diagnosis present

## 2023-01-27 DIAGNOSIS — Z833 Family history of diabetes mellitus: Secondary | ICD-10-CM

## 2023-01-27 DIAGNOSIS — E11649 Type 2 diabetes mellitus with hypoglycemia without coma: Secondary | ICD-10-CM | POA: Diagnosis not present

## 2023-01-27 DIAGNOSIS — K50119 Crohn's disease of large intestine with unspecified complications: Secondary | ICD-10-CM | POA: Diagnosis not present

## 2023-01-27 DIAGNOSIS — I82C11 Acute embolism and thrombosis of right internal jugular vein: Secondary | ICD-10-CM | POA: Diagnosis not present

## 2023-01-27 DIAGNOSIS — A0472 Enterocolitis due to Clostridium difficile, not specified as recurrent: Secondary | ICD-10-CM | POA: Diagnosis not present

## 2023-01-27 DIAGNOSIS — Z8249 Family history of ischemic heart disease and other diseases of the circulatory system: Secondary | ICD-10-CM

## 2023-01-27 DIAGNOSIS — I469 Cardiac arrest, cause unspecified: Principal | ICD-10-CM | POA: Diagnosis present

## 2023-01-27 DIAGNOSIS — E1165 Type 2 diabetes mellitus with hyperglycemia: Secondary | ICD-10-CM | POA: Diagnosis present

## 2023-01-27 DIAGNOSIS — E875 Hyperkalemia: Secondary | ICD-10-CM | POA: Diagnosis present

## 2023-01-27 DIAGNOSIS — K5981 Ogilvie syndrome: Secondary | ICD-10-CM | POA: Diagnosis not present

## 2023-01-27 DIAGNOSIS — Z885 Allergy status to narcotic agent status: Secondary | ICD-10-CM

## 2023-01-27 DIAGNOSIS — R601 Generalized edema: Secondary | ICD-10-CM | POA: Diagnosis not present

## 2023-01-27 DIAGNOSIS — E785 Hyperlipidemia, unspecified: Secondary | ICD-10-CM | POA: Diagnosis present

## 2023-01-27 DIAGNOSIS — R109 Unspecified abdominal pain: Secondary | ICD-10-CM | POA: Diagnosis not present

## 2023-01-27 DIAGNOSIS — K6389 Other specified diseases of intestine: Secondary | ICD-10-CM | POA: Diagnosis not present

## 2023-01-27 DIAGNOSIS — Z86711 Personal history of pulmonary embolism: Secondary | ICD-10-CM | POA: Diagnosis not present

## 2023-01-27 DIAGNOSIS — J9811 Atelectasis: Secondary | ICD-10-CM | POA: Diagnosis not present

## 2023-01-27 DIAGNOSIS — Z79899 Other long term (current) drug therapy: Secondary | ICD-10-CM

## 2023-01-27 DIAGNOSIS — I82623 Acute embolism and thrombosis of deep veins of upper extremity, bilateral: Secondary | ICD-10-CM | POA: Diagnosis not present

## 2023-01-27 DIAGNOSIS — B379 Candidiasis, unspecified: Secondary | ICD-10-CM | POA: Diagnosis not present

## 2023-01-27 DIAGNOSIS — Z91040 Latex allergy status: Secondary | ICD-10-CM

## 2023-01-27 DIAGNOSIS — K631 Perforation of intestine (nontraumatic): Secondary | ICD-10-CM | POA: Diagnosis present

## 2023-01-27 DIAGNOSIS — Z5986 Financial insecurity: Secondary | ICD-10-CM

## 2023-01-27 DIAGNOSIS — F17210 Nicotine dependence, cigarettes, uncomplicated: Secondary | ICD-10-CM | POA: Diagnosis present

## 2023-01-27 DIAGNOSIS — E119 Type 2 diabetes mellitus without complications: Secondary | ICD-10-CM | POA: Diagnosis not present

## 2023-01-27 DIAGNOSIS — Z5982 Transportation insecurity: Secondary | ICD-10-CM

## 2023-01-27 DIAGNOSIS — K55042 Diffuse acute infarction of large intestine: Secondary | ICD-10-CM | POA: Diagnosis present

## 2023-01-27 DIAGNOSIS — Z90722 Acquired absence of ovaries, bilateral: Secondary | ICD-10-CM

## 2023-01-27 DIAGNOSIS — N179 Acute kidney failure, unspecified: Secondary | ICD-10-CM | POA: Diagnosis present

## 2023-01-27 DIAGNOSIS — A0471 Enterocolitis due to Clostridium difficile, recurrent: Secondary | ICD-10-CM | POA: Diagnosis present

## 2023-01-27 DIAGNOSIS — Z86718 Personal history of other venous thrombosis and embolism: Secondary | ICD-10-CM

## 2023-01-27 DIAGNOSIS — R627 Adult failure to thrive: Secondary | ICD-10-CM | POA: Diagnosis present

## 2023-01-27 DIAGNOSIS — J90 Pleural effusion, not elsewhere classified: Secondary | ICD-10-CM | POA: Diagnosis not present

## 2023-01-27 DIAGNOSIS — A419 Sepsis, unspecified organism: Secondary | ICD-10-CM | POA: Diagnosis present

## 2023-01-27 DIAGNOSIS — E7220 Disorder of urea cycle metabolism, unspecified: Secondary | ICD-10-CM | POA: Diagnosis present

## 2023-01-27 DIAGNOSIS — E872 Acidosis, unspecified: Secondary | ICD-10-CM | POA: Diagnosis present

## 2023-01-27 DIAGNOSIS — G8929 Other chronic pain: Secondary | ICD-10-CM | POA: Diagnosis present

## 2023-01-27 DIAGNOSIS — Z9079 Acquired absence of other genital organ(s): Secondary | ICD-10-CM

## 2023-01-27 HISTORY — DX: Gastrointestinal hemorrhage, unspecified: K92.2

## 2023-01-27 LAB — GLUCOSE, CAPILLARY
Glucose-Capillary: 152 mg/dL — ABNORMAL HIGH (ref 70–99)
Glucose-Capillary: 120 mg/dL — ABNORMAL HIGH (ref 70–99)
Glucose-Capillary: 109 mg/dL — ABNORMAL HIGH (ref 70–99)

## 2023-01-27 LAB — CK: Total CK: 2899 U/L — ABNORMAL HIGH (ref 38–234)

## 2023-01-27 LAB — TYPE AND SCREEN
ABO/RH(D): B POS
ABO/RH(D): B POS
Antibody Screen: NEGATIVE
Antibody Screen: NEGATIVE
Unit division: 0
Unit division: 0

## 2023-01-27 LAB — COMPREHENSIVE METABOLIC PANEL
ALT: 16 U/L (ref 0–44)
AST: 37 U/L (ref 15–41)
Albumin: 2.4 g/dL — ABNORMAL LOW (ref 3.5–5.0)
Alkaline Phosphatase: 217 U/L — ABNORMAL HIGH (ref 38–126)
Anion gap: 29 — ABNORMAL HIGH (ref 5–15)
BUN: 56 mg/dL — ABNORMAL HIGH (ref 6–20)
CO2: 11 mmol/L — ABNORMAL LOW (ref 22–32)
Calcium: 7.7 mg/dL — ABNORMAL LOW (ref 8.9–10.3)
Chloride: 90 mmol/L — ABNORMAL LOW (ref 98–111)
Creatinine, Ser: 3.37 mg/dL — ABNORMAL HIGH (ref 0.44–1.00)
GFR, Estimated: 16 mL/min — ABNORMAL LOW (ref >60)
Glucose, Bld: 173 mg/dL — ABNORMAL HIGH (ref 70–99)
Potassium: 3.7 mmol/L (ref 3.5–5.1)
Sodium: 130 mmol/L — ABNORMAL LOW (ref 135–145)
Total Bilirubin: 0.7 mg/dL (ref 0.3–1.2)
Total Protein: 5.2 g/dL — ABNORMAL LOW (ref 6.5–8.1)

## 2023-01-27 LAB — PREPARE RBC (CROSSMATCH)

## 2023-01-27 LAB — BASIC METABOLIC PANEL
Anion gap: 13 (ref 5–15)
BUN: 45 mg/dL — ABNORMAL HIGH (ref 6–20)
CO2: 17 mmol/L — ABNORMAL LOW (ref 22–32)
Calcium: 6.9 mg/dL — ABNORMAL LOW (ref 8.9–10.3)
Chloride: 100 mmol/L (ref 98–111)
Creatinine, Ser: 1.87 mg/dL — ABNORMAL HIGH (ref 0.44–1.00)
GFR, Estimated: 32 mL/min — ABNORMAL LOW (ref >60)
Glucose, Bld: 139 mg/dL — ABNORMAL HIGH (ref 70–99)
Potassium: 3.6 mmol/L (ref 3.5–5.1)
Sodium: 130 mmol/L — ABNORMAL LOW (ref 135–145)

## 2023-01-27 LAB — BLOOD GAS, ARTERIAL
Acid-base deficit: 6.1 mmol/L — ABNORMAL HIGH (ref 0.0–2.0)
Bicarbonate: 18.9 mmol/L — ABNORMAL LOW (ref 20.0–28.0)
Drawn by: 4627
O2 Saturation: 100 %
Patient temperature: 37.3
pCO2 arterial: 36 mmHg (ref 32–48)
pH, Arterial: 7.34 — ABNORMAL LOW (ref 7.35–7.45)
pO2, Arterial: 213 mmHg — ABNORMAL HIGH (ref 83–108)

## 2023-01-27 LAB — POCT I-STAT 7, (LYTES, BLD GAS, ICA,H+H)
Acid-base deficit: 5 mmol/L — ABNORMAL HIGH (ref 0.0–2.0)
Bicarbonate: 19.1 mmol/L — ABNORMAL LOW (ref 20.0–28.0)
Calcium, Ion: 0.97 mmol/L — ABNORMAL LOW (ref 1.15–1.40)
HCT: 41 % (ref 36.0–46.0)
Hemoglobin: 13.9 g/dL (ref 12.0–15.0)
O2 Saturation: 100 %
Patient temperature: 37.5
Potassium: 3.3 mmol/L — ABNORMAL LOW (ref 3.5–5.1)
Sodium: 129 mmol/L — ABNORMAL LOW (ref 135–145)
TCO2: 20 mmol/L — ABNORMAL LOW (ref 22–32)
pCO2 arterial: 33.9 mmHg (ref 32–48)
pH, Arterial: 7.36 (ref 7.35–7.45)
pO2, Arterial: 196 mmHg — ABNORMAL HIGH (ref 83–108)

## 2023-01-27 LAB — CBC
HCT: 40.8 % (ref 36.0–46.0)
HCT: 40.9 % (ref 36.0–46.0)
Hemoglobin: 13.4 g/dL (ref 12.0–15.0)
Hemoglobin: 14 g/dL (ref 12.0–15.0)
MCH: 28.2 pg (ref 26.0–34.0)
MCH: 27.9 pg (ref 26.0–34.0)
MCHC: 32.8 g/dL (ref 30.0–36.0)
MCHC: 34.2 g/dL (ref 30.0–36.0)
MCV: 85.7 fL (ref 80.0–100.0)
MCV: 81.5 fL (ref 80.0–100.0)
Platelets: 435 10*3/uL — ABNORMAL HIGH (ref 150–400)
Platelets: 312 10*3/uL (ref 150–400)
RBC: 4.76 MIL/uL (ref 3.87–5.11)
RBC: 5.02 MIL/uL (ref 3.87–5.11)
RDW: 14.1 % (ref 11.5–15.5)
RDW: 13.8 % (ref 11.5–15.5)
WBC: 17.2 10*3/uL — ABNORMAL HIGH (ref 4.0–10.5)
WBC: 11.3 10*3/uL — ABNORMAL HIGH (ref 4.0–10.5)
nRBC: 0.5 % — ABNORMAL HIGH (ref 0.0–0.2)
nRBC: 0.5 % — ABNORMAL HIGH (ref 0.0–0.2)

## 2023-01-27 LAB — BLOOD GAS, VENOUS
Acid-base deficit: 15.3 mmol/L — ABNORMAL HIGH (ref 0.0–2.0)
Bicarbonate: 14 mmol/L — ABNORMAL LOW (ref 20.0–28.0)
O2 Saturation: 86.4 %
Patient temperature: 35.6
pCO2, Ven: 42 mmHg — ABNORMAL LOW (ref 44–60)
pH, Ven: 7.12 — CL (ref 7.25–7.43)
pO2, Ven: 57 mmHg — ABNORMAL HIGH (ref 32–45)

## 2023-01-27 LAB — DIFFERENTIAL
Abs Immature Granulocytes: 0.9 10*3/uL — ABNORMAL HIGH (ref 0.00–0.07)
Band Neutrophils: 13 %
Basophils Absolute: 0 10*3/uL (ref 0.0–0.1)
Basophils Relative: 0 %
Eosinophils Absolute: 0.2 10*3/uL (ref 0.0–0.5)
Eosinophils Relative: 1 %
Lymphocytes Relative: 22 %
Lymphs Abs: 3.8 10*3/uL (ref 0.7–4.0)
Metamyelocytes Relative: 4 %
Monocytes Absolute: 0.5 10*3/uL (ref 0.1–1.0)
Monocytes Relative: 3 %
Myelocytes: 1 %
Neutro Abs: 11.9 10*3/uL — ABNORMAL HIGH (ref 1.7–7.7)
Neutrophils Relative %: 56 %
nRBC: 1 /100 WBC — ABNORMAL HIGH

## 2023-01-27 LAB — URINALYSIS, ROUTINE W REFLEX MICROSCOPIC
Bilirubin Urine: NEGATIVE
Glucose, UA: NEGATIVE mg/dL
Hgb urine dipstick: NEGATIVE
Ketones, ur: 5 mg/dL — AB
Nitrite: NEGATIVE
Protein, ur: 30 mg/dL — AB
Specific Gravity, Urine: 1.02 (ref 1.005–1.030)
pH: 5 (ref 5.0–8.0)

## 2023-01-27 LAB — BPAM RBC
Blood Product Expiration Date: 202410122359
Blood Product Expiration Date: 202410172359
ISSUE DATE / TIME: 202409260447
ISSUE DATE / TIME: 202409260456
Unit Type and Rh: 9500
Unit Type and Rh: 9500

## 2023-01-27 LAB — I-STAT CHEM 8, ED
BUN: 51 mg/dL — ABNORMAL HIGH (ref 6–20)
Calcium, Ion: 0.84 mmol/L — CL (ref 1.15–1.40)
Chloride: 94 mmol/L — ABNORMAL LOW (ref 98–111)
Creatinine, Ser: 3.4 mg/dL — ABNORMAL HIGH (ref 0.44–1.00)
Glucose, Bld: 168 mg/dL — ABNORMAL HIGH (ref 70–99)
HCT: 41 % (ref 36.0–46.0)
Hemoglobin: 13.9 g/dL (ref 12.0–15.0)
Potassium: 3.8 mmol/L (ref 3.5–5.1)
Sodium: 128 mmol/L — ABNORMAL LOW (ref 135–145)
TCO2: 16 mmol/L — ABNORMAL LOW (ref 22–32)

## 2023-01-27 LAB — PROTIME-INR
INR: 1.5 — ABNORMAL HIGH (ref 0.8–1.2)
Prothrombin Time: 18.3 seconds — ABNORMAL HIGH (ref 11.4–15.2)

## 2023-01-27 LAB — CULTURE, BLOOD (ROUTINE X 2)
Culture: NO GROWTH
Culture: NO GROWTH

## 2023-01-27 LAB — RAPID URINE DRUG SCREEN, HOSP PERFORMED
Amphetamines: NOT DETECTED
Barbiturates: NOT DETECTED
Benzodiazepines: POSITIVE — AB
Cocaine: NOT DETECTED
Opiates: NOT DETECTED
Tetrahydrocannabinol: POSITIVE — AB

## 2023-01-27 LAB — RESP PANEL BY RT-PCR (RSV, FLU A&B, COVID)  RVPGX2
Influenza A by PCR: NEGATIVE
Influenza B by PCR: NEGATIVE
Resp Syncytial Virus by PCR: NEGATIVE
SARS Coronavirus 2 by RT PCR: NEGATIVE

## 2023-01-27 LAB — HIV ANTIBODY (ROUTINE TESTING W REFLEX): HIV Screen 4th Generation wRfx: NONREACTIVE

## 2023-01-27 LAB — APTT: aPTT: 51 seconds — ABNORMAL HIGH (ref 24–36)

## 2023-01-27 LAB — CBG MONITORING, ED: Glucose-Capillary: 139 mg/dL — ABNORMAL HIGH (ref 70–99)

## 2023-01-27 LAB — AMMONIA: Ammonia: 57 umol/L — ABNORMAL HIGH (ref 9–35)

## 2023-01-27 LAB — OCCULT BLOOD GASTRIC / DUODENUM (SPECIMEN CUP)
Occult Blood, Gastric: POSITIVE — AB
pH, Gastric: 4

## 2023-01-27 LAB — LACTIC ACID, PLASMA
Lactic Acid, Venous: 8.1 mmol/L (ref 0.5–1.9)
Lactic Acid, Venous: 5.2 mmol/L (ref 0.5–1.9)

## 2023-01-27 LAB — TROPONIN I (HIGH SENSITIVITY): Troponin I (High Sensitivity): 17 ng/L (ref <18)

## 2023-01-27 LAB — MRSA NEXT GEN BY PCR, NASAL: MRSA by PCR Next Gen: NOT DETECTED

## 2023-01-27 LAB — HEMOGLOBIN A1C
Hgb A1c MFr Bld: 5.8 % — ABNORMAL HIGH (ref 4.8–5.6)
Mean Plasma Glucose: 119.76 mg/dL

## 2023-01-27 LAB — ETHANOL: Alcohol, Ethyl (B): <10 mg/dL (ref <10)

## 2023-01-27 MED ORDER — VASOPRESSIN 20 UNITS/100 ML INFUSION FOR SHOCK
0.0000 [IU]/min | INTRAVENOUS | Status: DC
  Administered 2023-01-27 – 2023-01-29 (×6): 0.03 [IU]/min via INTRAVENOUS
  Administered 2023-01-29: 0.02 [IU]/min via INTRAVENOUS
  Filled 2023-01-27 (×6): qty 100

## 2023-01-27 MED ORDER — EPINEPHRINE 1 MG/10ML IJ SOSY
PREFILLED_SYRINGE | INTRAMUSCULAR | Status: AC | PRN
  Administered 2023-01-27: 1 mg via INTRAVENOUS

## 2023-01-27 MED ORDER — LACTATED RINGERS IV BOLUS
500.0000 mL | Freq: Once | INTRAVENOUS | Status: AC
  Administered 2023-01-28: 500 mL via INTRAVENOUS

## 2023-01-27 MED ORDER — ORAL CARE MOUTH RINSE: 15.0000 mL | OROMUCOSAL | Status: DC | PRN

## 2023-01-27 MED ORDER — ORAL CARE MOUTH RINSE
15.0000 mL | OROMUCOSAL | Status: DC
  Administered 2023-01-27 – 2023-02-03 (×78): 15 mL via OROMUCOSAL

## 2023-01-27 MED ORDER — NOREPINEPHRINE 4 MG/250ML-% IV SOLN
0.0000 ug/min | INTRAVENOUS | Status: DC
  Administered 2023-01-27: 2 ug/min via INTRAVENOUS
  Administered 2023-01-28: 3 ug/min via INTRAVENOUS
  Filled 2023-01-27 (×2): qty 250

## 2023-01-27 MED ORDER — NOREPINEPHRINE 4 MG/250ML-% IV SOLN
INTRAVENOUS | Status: AC
  Administered 2023-01-27: 10 ug/min via INTRAVENOUS
  Filled 2023-01-27: qty 250

## 2023-01-27 MED ORDER — PANTOPRAZOLE SODIUM 40 MG IV SOLR
40.0000 mg | Freq: Two times a day (BID) | INTRAVENOUS | Status: DC
  Administered 2023-01-27 – 2023-02-07 (×23): 40 mg via INTRAVENOUS
  Filled 2023-01-27 (×23): qty 10

## 2023-01-27 MED ORDER — SODIUM CHLORIDE 0.9 % IV BOLUS
1000.0000 mL | Freq: Once | INTRAVENOUS | Status: AC
  Administered 2023-01-27: 1000 mL via INTRAVENOUS

## 2023-01-27 MED ORDER — ALBUTEROL SULFATE (2.5 MG/3ML) 0.083% IN NEBU: 2.5000 mg | INHALATION_SOLUTION | RESPIRATORY_TRACT | Status: DC | PRN

## 2023-01-27 MED ORDER — LACTATED RINGERS IV BOLUS
500.0000 mL | Freq: Once | INTRAVENOUS | Status: AC
  Administered 2023-01-27: 500 mL via INTRAVENOUS

## 2023-01-27 MED ORDER — CHLORHEXIDINE GLUCONATE CLOTH 2 % EX PADS
6.0000 | MEDICATED_PAD | Freq: Every day | CUTANEOUS | Status: DC
  Administered 2023-01-27 – 2023-03-19 (×53): 6 via TOPICAL

## 2023-01-27 MED ORDER — SODIUM CHLORIDE 0.9 % IV SOLN
2.0000 g | INTRAVENOUS | Status: AC
  Administered 2023-01-27 – 2023-02-05 (×10): 2 g via INTRAVENOUS
  Filled 2023-01-27 (×10): qty 20

## 2023-01-27 MED ORDER — METRONIDAZOLE 500 MG/100ML IV SOLN
500.0000 mg | Freq: Once | INTRAVENOUS | Status: AC
  Administered 2023-01-27: 500 mg via INTRAVENOUS
  Filled 2023-01-27: qty 100

## 2023-01-27 MED ORDER — LACTATED RINGERS IV BOLUS
1000.0000 mL | Freq: Once | INTRAVENOUS | Status: AC
  Administered 2023-01-27: 1000 mL via INTRAVENOUS

## 2023-01-27 MED ORDER — INSULIN ASPART 100 UNIT/ML IJ SOLN
0.0000 [IU] | INTRAMUSCULAR | Status: DC
  Administered 2023-01-27: 2 [IU] via SUBCUTANEOUS
  Administered 2023-01-28 (×2): 1 [IU] via SUBCUTANEOUS
  Administered 2023-01-28: 2 [IU] via SUBCUTANEOUS
  Administered 2023-01-30 – 2023-02-01 (×7): 1 [IU] via SUBCUTANEOUS
  Administered 2023-02-01: 2 [IU] via SUBCUTANEOUS
  Administered 2023-02-01 (×4): 1 [IU] via SUBCUTANEOUS
  Administered 2023-02-01: 2 [IU] via SUBCUTANEOUS
  Administered 2023-02-02 (×2): 1 [IU] via SUBCUTANEOUS
  Administered 2023-02-03: 2 [IU] via SUBCUTANEOUS
  Administered 2023-02-03: 1 [IU] via SUBCUTANEOUS
  Administered 2023-02-03: 2 [IU] via SUBCUTANEOUS
  Administered 2023-02-03: 3 [IU] via SUBCUTANEOUS
  Administered 2023-02-03: 2 [IU] via SUBCUTANEOUS
  Administered 2023-02-03: 1 [IU] via SUBCUTANEOUS
  Administered 2023-02-04 (×2): 3 [IU] via SUBCUTANEOUS
  Administered 2023-02-04: 2 [IU] via SUBCUTANEOUS
  Administered 2023-02-04: 3 [IU] via SUBCUTANEOUS
  Administered 2023-02-04 (×2): 2 [IU] via SUBCUTANEOUS
  Administered 2023-02-05 (×2): 3 [IU] via SUBCUTANEOUS
  Administered 2023-02-05: 1 [IU] via SUBCUTANEOUS
  Administered 2023-02-05: 3 [IU] via SUBCUTANEOUS
  Administered 2023-02-05 – 2023-02-06 (×2): 1 [IU] via SUBCUTANEOUS
  Administered 2023-02-06 (×4): 2 [IU] via SUBCUTANEOUS
  Administered 2023-02-07: 1 [IU] via SUBCUTANEOUS
  Administered 2023-02-07: 2 [IU] via SUBCUTANEOUS
  Administered 2023-02-07 (×2): 1 [IU] via SUBCUTANEOUS
  Administered 2023-02-07: 2 [IU] via SUBCUTANEOUS
  Administered 2023-02-07 – 2023-02-08 (×2): 1 [IU] via SUBCUTANEOUS
  Administered 2023-02-08: 3 [IU] via SUBCUTANEOUS
  Administered 2023-02-08 (×2): 2 [IU] via SUBCUTANEOUS
  Administered 2023-02-09: 3 [IU] via SUBCUTANEOUS
  Administered 2023-02-09 (×2): 2 [IU] via SUBCUTANEOUS
  Administered 2023-02-09: 5 [IU] via SUBCUTANEOUS
  Administered 2023-02-09: 2 [IU] via SUBCUTANEOUS
  Administered 2023-02-11: 3 [IU] via SUBCUTANEOUS
  Administered 2023-02-12: 2 [IU] via SUBCUTANEOUS
  Administered 2023-02-13: 1 [IU] via SUBCUTANEOUS
  Administered 2023-02-13: 2 [IU] via SUBCUTANEOUS
  Administered 2023-02-14 – 2023-02-15 (×4): 1 [IU] via SUBCUTANEOUS
  Administered 2023-02-15: 2 [IU] via SUBCUTANEOUS
  Administered 2023-02-16 – 2023-02-28 (×13): 1 [IU] via SUBCUTANEOUS

## 2023-01-27 MED ORDER — FENTANYL 2500MCG IN NS 250ML (10MCG/ML) PREMIX INFUSION
0.0000 ug/h | INTRAVENOUS | Status: DC
  Administered 2023-01-27: 25 ug/h via INTRAVENOUS
  Administered 2023-01-29: 50 ug/h via INTRAVENOUS
  Filled 2023-01-27 (×2): qty 250

## 2023-01-27 MED ORDER — PROPOFOL 1000 MG/100ML IV EMUL
5.0000 ug/kg/min | INTRAVENOUS | Status: DC
  Administered 2023-01-27 (×2): 20 ug/kg/min via INTRAVENOUS
  Administered 2023-01-28: 25 ug/kg/min via INTRAVENOUS
  Filled 2023-01-27 (×2): qty 100

## 2023-01-27 MED ORDER — SODIUM BICARBONATE 8.4 % IV SOLN
INTRAVENOUS | Status: AC | PRN
  Administered 2023-01-27: 50 meq via INTRAVENOUS

## 2023-01-27 MED ORDER — PANTOPRAZOLE 80MG IVPB - SIMPLE MED
80.0000 mg | Freq: Once | INTRAVENOUS | Status: AC
  Administered 2023-01-27: 80 mg via INTRAVENOUS
  Filled 2023-01-27: qty 100

## 2023-01-27 MED ORDER — SODIUM CHLORIDE 0.9 % IV SOLN
2.0000 g | Freq: Once | INTRAVENOUS | Status: AC
  Administered 2023-01-27: 2 g via INTRAVENOUS
  Filled 2023-01-27: qty 12.5

## 2023-01-27 MED ORDER — LEVETIRACETAM IN NACL 500 MG/100ML IV SOLN
500.0000 mg | Freq: Two times a day (BID) | INTRAVENOUS | Status: DC
  Administered 2023-01-27 (×2): 500 mg via INTRAVENOUS
  Filled 2023-01-27 (×2): qty 100

## 2023-01-27 MED ORDER — SODIUM CHLORIDE 0.9% IV SOLUTION: Freq: Once | INTRAVENOUS | Status: DC

## 2023-01-27 MED ORDER — PROPOFOL 1000 MG/100ML IV EMUL
INTRAVENOUS | Status: AC
  Administered 2023-01-27: 15 ug/kg/min via INTRAVENOUS
  Filled 2023-01-27: qty 100

## 2023-01-27 MED ORDER — PROPOFOL 1000 MG/100ML IV EMUL: 5.0000 ug/kg/min | INTRAVENOUS | Status: DC

## 2023-01-27 MED ORDER — VANCOMYCIN HCL 1250 MG/250ML IV SOLN
1250.0000 mg | Freq: Once | INTRAVENOUS | Status: AC
  Administered 2023-01-27: 1250 mg via INTRAVENOUS
  Filled 2023-01-27: qty 250

## 2023-01-27 MED ORDER — LACTATED RINGERS IV SOLN: INTRAVENOUS | Status: DC

## 2023-01-27 MED ORDER — METRONIDAZOLE 500 MG/100ML IV SOLN
500.0000 mg | Freq: Two times a day (BID) | INTRAVENOUS | Status: DC
  Administered 2023-01-27 – 2023-02-03 (×15): 500 mg via INTRAVENOUS
  Filled 2023-01-27 (×13): qty 100

## 2023-01-27 MED ORDER — ACETAMINOPHEN 10 MG/ML IV SOLN
1000.0000 mg | Freq: Four times a day (QID) | INTRAVENOUS | Status: AC
  Administered 2023-01-27 – 2023-01-28 (×4): 1000 mg via INTRAVENOUS
  Filled 2023-01-27 (×6): qty 100

## 2023-01-27 NOTE — Progress Notes (Signed)
Patient transported to CT from ED room 1 then back to ED room 1 after CT. Patient tolerated well with no adverse events noted during transport. FiO2 turned down to 75% from 100% prior to trip.

## 2023-01-27 NOTE — ED Notes (Signed)
[facility], updated. 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

## 2023-01-27 NOTE — Consult Note (Signed)
Consultation  Primary Care Physician:  [name], PA-C Primary Gastroenterologist:  Kateri Mc       Reason for Consultation: dark bloody emesis, diarrhea  DOA: 01/27/2023         Hospital Day: 1         HPI:   [name] is a 52 y.o. female with past medical history significant for COPD, Crohn's complicated by colonic stricture (intolerant to infliximab/rash, adalimumab/rash) ,recurrent Cdiff, HTN, and DM presented to [facility] 9/26 via EMS after being called by family for pt with GI bleeding over last few days. Initially pt was refusing to go to the hospital but became unresponsive and family called EMS. On EMS arrival she was found in large pool of dark bloody emesis +/- diarrhea with SBP 60's and minimally responsive. On arrival to ER she was unresponsive with agonal breathing, pulseless. She had brief 1 round of CPR ~85mins, 1 epi with ROSC. She was intubated in ER and PCCM called for tx/admission to [facility]   Work up in ER notable for : anion gap acidosis and AKI with Creat 3.37 and BUN 51.  Hemoglobin 13.4. Alk. Phos 217.  Ammonia 57.  Lactic acid 8.1. Requiring levophed, 2L IVF given.   CXR Unchanged left lower lung linear opacity, likely atelectasis. CT head with no acute intracranial abnormalities  Pt last seen by [facility] on 10/14/22 for her Crohn's disease and currently on Skyrizi maintenance dosing.  Extensive history in regards to her Crohn's and IBD per her note at [facility] in Care Everywhere.  Pt intubated. No family at bedside. OG tube not to suction, no output. Foley catheter in place dark amber in color. No output per rectum per RN. Distended abdomen with hypoactive bowel sounds.   Previous GI workup:   01/15/2022 CT ABDOMEN AND PELVIS WITH CONTRAST   IMPRESSION: 1. Persistent proctocolitis involving the rectosigmoid colon. Recommend colonoscopy status post treatment and status post complete resolution of inflammatory changes to exclude an underlying lesion. 2. Interval  decrease in trace bilateral pleural effusions, right greater than left. 3. Partially visualized at least trace to small volume pericardial effusion. 4. Mild hepatomegaly.  07/15/2021 Colonoscopy with Lionel December, MD,  Impression:  - Preparation of the colon was poor resulting in incomplete examination. - Formed stool noted in rectum and sigmoid colon.  - Rectal stricture found on digital rectal exam. Stricture is noncritical.  - Crohn' s disease with colonic involvement. Inflammation was found. This was severe. Biopsied.  FINAL MICROSCOPIC DIAGNOSIS:   A. COLON, RECTUM AND SIGMOID, BIOPSY:  -  Chronic active colitis with ulceration  -  Detached necroinflammatory debris  -  No granulomata, dysplasia or malignancy identified   Abnormal ED labs: Abnormal Labs Reviewed  CBC - Abnormal; Notable for the following components:      Result Value   WBC 17.2 (*)    Platelets 435 (*)    nRBC 0.5 (*)    All other components within normal limits  DIFFERENTIAL - Abnormal; Notable for the following components:   Neutro Abs 11.9 (*)    nRBC 1 (*)    Abs Immature Granulocytes 0.90 (*)    All other components within normal limits  COMPREHENSIVE METABOLIC PANEL - Abnormal; Notable for the following components:   Sodium 130 (*)    Chloride 90 (*)    CO2 11 (*)    Glucose, Bld 173 (*)    BUN 56 (*)    Creatinine, Ser 3.37 (*)  Calcium 7.7 (*)    Total Protein 5.2 (*)    Albumin 2.4 (*)    Alkaline Phosphatase 217 (*)    GFR, Estimated 16 (*)    Anion gap 29 (*)    All other components within normal limits  RAPID URINE DRUG SCREEN, HOSP PERFORMED - Abnormal; Notable for the following components:   Benzodiazepines POSITIVE (*)    Tetrahydrocannabinol POSITIVE (*)    All other components within normal limits  URINALYSIS, ROUTINE W REFLEX MICROSCOPIC - Abnormal; Notable for the following components:   Color, Urine AMBER (*)    APPearance HAZY (*)    Ketones, ur 5 (*)    Protein, ur  30 (*)    Leukocytes,Ua TRACE (*)    Bacteria, UA RARE (*)    All other components within normal limits  BLOOD GAS, VENOUS - Abnormal; Notable for the following components:   pH, Ven 7.12 (*)    pCO2, Ven 42 (*)    pO2, Ven 57 (*)    Bicarbonate 14.0 (*)    Acid-base deficit 15.3 (*)    All other components within normal limits  PROTIME-INR - Abnormal; Notable for the following components:   Prothrombin Time 18.3 (*)    INR 1.5 (*)    All other components within normal limits  APTT - Abnormal; Notable for the following components:   aPTT 51 (*)    All other components within normal limits  LACTIC ACID, PLASMA - Abnormal; Notable for the following components:   Lactic Acid, Venous 8.1 (*)    All other components within normal limits  LACTIC ACID, PLASMA - Abnormal; Notable for the following components:   Lactic Acid, Venous 5.2 (*)    All other components within normal limits  AMMONIA - Abnormal; Notable for the following components:   Ammonia 57 (*)    All other components within normal limits  OCCULT BLOOD GASTRIC / DUODENUM (SPECIMEN CUP) - Abnormal; Notable for the following components:   Occult Blood, Gastric POSITIVE (*)    All other components within normal limits  BLOOD GAS, ARTERIAL - Abnormal; Notable for the following components:   pH, Arterial 7.34 (*)    pO2, Arterial 213 (*)    Bicarbonate 18.9 (*)    Acid-base deficit 6.1 (*)    All other components within normal limits  GLUCOSE, CAPILLARY - Abnormal; Notable for the following components:   Glucose-Capillary 152 (*)    All other components within normal limits  CBC - Abnormal; Notable for the following components:   WBC 11.3 (*)    nRBC 0.5 (*)    All other components within normal limits  I-STAT CHEM 8, ED - Abnormal; Notable for the following components:   Sodium 128 (*)    Chloride 94 (*)    BUN 51 (*)    Creatinine, Ser 3.40 (*)    Glucose, Bld 168 (*)    Calcium, Ion 0.84 (*)    TCO2 16 (*)    All  other components within normal limits  CBG MONITORING, ED - Abnormal; Notable for the following components:   Glucose-Capillary 139 (*)    All other components within normal limits  POCT I-STAT 7, (LYTES, BLD GAS, ICA,H+H) - Abnormal; Notable for the following components:   pO2, Arterial 196 (*)    Bicarbonate 19.1 (*)    TCO2 20 (*)    Acid-base deficit 5.0 (*)    Sodium 129 (*)    Potassium 3.3 (*)  Calcium, Ion 0.97 (*)    All other components within normal limits    Past Medical History:  Diagnosis Date   Anxiety    Asthma    Chronic low back pain    Collagen vascular disease (HCC)    COPD (chronic obstructive pulmonary disease) (HCC)    Crohn's disease (HCC)    GERD (gastroesophageal reflux disease)    EGD 01/2007 by Dr.Rourke small hiatal hernia s/p 56 french maloney    History of head injury    Hypertension    IBS (irritable bowel syndrome)    PSVT (paroxysmal supraventricular tachycardia)    PTSD (post-traumatic stress disorder)    Recurrent chest pain    Seizure disorder (HCC)    Type 2 diabetes mellitus (HCC)     Surgical History:  She  has a past surgical history that includes Appendectomy (2006); Abdominal hysterectomy; Oophorectomy; Cesarean section; Colonoscopy (2010); Cholecystectomy; Esophagogastroduodenoscopy (N/A, 10/25/2014); Esophageal dilation (N/A, 10/25/2014); Flexible sigmoidoscopy (N/A, 02/06/2021); biopsy (02/06/2021); Flexible sigmoidoscopy (N/A, 02/19/2021); Balloon dilation (N/A, 02/19/2021); biopsy (02/19/2021); biopsy (07/15/2021); and Sigmoidoscopy (07/15/2021). Family History:  Her family history includes Cancer in her mother; Cancer (age of onset: 85) in her father; Crohn's disease in her cousin; Diabetes in her father; Heart attack in her mother; Heart attack (age of onset: 4) in her father; Heart failure in her father and mother; Hypertension in her mother. Social History:   reports that she has been smoking cigarettes. She started smoking about  38 years ago. She has a 19.2 pack-year smoking history. She has been exposed to tobacco smoke. She has never used smokeless tobacco. She reports current drug use. Drug: Marijuana. She reports that she does not drink alcohol.  Prior to Admission medications   Medication Sig Start Date End Date Taking? Authorizing Provider  acetaminophen (TYLENOL) 500 MG tablet Take by mouth. 11/27/21  Yes [provider]  ALPRAZolam (XANAX) 1 MG tablet Take 1 mg by mouth 3 (three) times daily as needed for anxiety. 07/11/19  Yes [provider]  amitriptyline (ELAVIL) 100 MG tablet Take 100 mg by mouth at bedtime.   Yes [provider]  budesonide (ENTOCORT EC) 3 MG 24 hr capsule Take 9 mg by mouth daily.   Yes [provider]  cyclobenzaprine (FLEXERIL) 10 MG tablet Take 10 mg by mouth 3 (three) times daily as needed.   Yes [provider]  hydrOXYzine (ATARAX) 25 MG tablet Take 25 mg by mouth 3 (three) times daily as needed for anxiety. 12/13/21  Yes [provider]  hydrOXYzine (ATARAX) 50 MG tablet Take 25-50 mg by mouth 3 (three) times daily as needed.   Yes [provider]  levETIRAcetam (KEPPRA) 750 MG tablet Take 750 mg by mouth 2 (two) times daily. 09/30/20  Yes [provider]  ondansetron (ZOFRAN-ODT) 4 MG disintegrating tablet Take 4 mg by mouth every 8 (eight) hours as needed for nausea or vomiting. 09/07/21  Yes [provider]  pantoprazole (PROTONIX) 40 MG tablet Take 1 tablet (40 mg total) by mouth 2 (two) times daily. 01/03/22  Yes Vassie Loll, MD  acetaminophen (TYLENOL) 325 MG tablet Take 2 tablets (650 mg total) by mouth every 6 (six) hours as needed for mild pain or headache (or Fever >/= 101). 01/03/22   Vassie Loll, MD  cetirizine (ZYRTEC) 10 MG tablet Take 10 mg by mouth daily. 01/08/22   [provider]  clotrimazole-betamethasone (LOTRISONE) cream SMARTSIG:Sparingly Topical Twice Daily PRN    [provider]  DULoxetine (CYMBALTA) 30 MG capsule Take 30 mg by mouth daily. 12/17/21   [provider]  feeding supplement (ENSURE ENLIVE / ENSURE PLUS) LIQD Take 237 mLs by mouth 2 (two) times daily between meals. 01/04/22   Vassie Loll, MD  folic acid (FOLVITE) 1 MG tablet Take 1 tablet (1 mg total) by mouth daily. 10/03/21   Catarina Hartshorn, MD  gabapentin (NEURONTIN) 300 MG capsule TAKE (1) CAPSULE BY MOUTH TWICE DAILY. 10/15/22   [provider]  metoprolol succinate (TOPROL XL) 25 MG 24 hr tablet Take 1 tablet (25 mg total) by mouth daily. 04/23/22   Jonelle Sidle, MD  metoprolol tartrate (LOPRESSOR) 25 MG tablet Take 25 mg by mouth 2 (two) times daily.    [provider]  Multiple Vitamins-Minerals (SUPER THERA VITE M) TABS Take 1 tablet by mouth daily. 12/13/21   [provider]  nitroGLYCERIN (NITROSTAT) 0.4 MG SL tablet Place 1 tablet (0.4 mg total) under the tongue every 5 (five) minutes as needed for chest pain. 03/19/21   Malissa Hippo, MD  omeprazole (PRILOSEC) 40 MG capsule Take 40 mg by mouth 2 (two) times daily.    [provider]  polyethylene glycol powder (GLYCOLAX/MIRALAX) 17 GM/SCOOP powder Take by mouth. 11/28/21   [provider]  RESTASIS 0.05 % ophthalmic emulsion Place 1 drop into both eyes 2 (two) times daily. 12/12/20   [provider]  Risankizumab-rzaa (SKYRIZI) 360 MG/2.4ML SOCT Inject 2.4 mLs into the skin See admin instructions. Every 8 weeks 07/20/22   [provider]  SPIRIVA RESPIMAT 2.5 MCG/ACT AERS Inhale 1 puff into the lungs daily. 01/03/22   Vassie Loll, MD  triazolam (HALCION) 0.25 MG tablet Take 0.25 mg by mouth daily.    [provider]  Vitamin D, Ergocalciferol, (DRISDOL) 1.25 MG (50000 UNIT) CAPS capsule Take 50,000 Units by mouth once a week. Takes on Wednesdays. 12/14/21   [provider]    Current Facility-Administered Medications  Medication Dose Route  Frequency Provider Last Rate Last Admin   0.9 %  sodium chloride infusion (Manually program via Guardrails IV Fluids)   Intravenous Once Marily Memos, MD   Held at 01/27/23 0502   acetaminophen (OFIRMEV) IV 1,000 mg  1,000 mg Intravenous Q6H Mesner, Jason, MD   Stopped at 01/27/23 0807   albuterol (PROVENTIL) (2.5 MG/3ML) 0.083% nebulizer solution 2.5 mg  2.5 mg Nebulization Q3H PRN Bernadene Person, NP       Chlorhexidine Gluconate Cloth 2 % PADS 6 each  6 each Topical Daily Briant Sites, DO   6 each at 01/27/23 1220   fentaNYL in NS (41mcg/ml) infusion-PREMIX  0-400 mcg/hr Intravenous Continuous Mesner, Barbara Cower, MD 2.5 mL/hr at 01/27/23 0914 25 mcg/hr at 01/27/23 0914   insulin aspart (novoLOG) injection 0-9 Units  0-9 Units Subcutaneous Q4H Whiteheart, Mellody Life, NP   2 Units at 01/27/23 1225   lactated ringers infusion   Intravenous Continuous Mesner, Jason, MD 125 mL/hr at 01/27/23 1236 New Bag at 01/27/23 1236   levETIRAcetam (KEPPRA) IVPB 500 mg/100 mL premix  500 mg Intravenous BID Danford Bad A, NP 400 mL/hr at 01/27/23 1225 500 mg at 01/27/23 1225   norepinephrine (LEVOPHED) 4mg  in (0.016 mg/mL) premix infusion  0-40 mcg/min Intravenous Titrated Mesner, Jason, MD 15 mL/hr at 01/27/23 0918 4 mcg/min at 01/27/23 0918   pantoprazole (PROTONIX) injection 40 mg  40 mg Intravenous Q12H Whiteheart, Mellody Life, NP   40 mg  at 01/27/23 1225   propofol (DIPRIVAN) 1000 MG/100ML infusion  5-80 mcg/kg/min Intravenous Continuous Mesner, Barbara Cower, MD 5 mL/hr at 01/27/23 0912 13.123 mcg/kg/min at 01/27/23 0912   vasopressin (PITRESSIN) 20 Units in 100 mL (0.2 unit/mL) infusion-*FOR SHOCK*  0-0.03 Units/min Intravenous Continuous Mesner, Barbara Cower, MD 6 mL/hr at 01/27/23 0801 0.02 Units/min at 01/27/23 0801    Allergies as of 01/27/2023 - Review Complete 01/27/2023  Allergen Reaction Noted   Codeine Shortness Of Breath, Swelling, and Rash    Dilaudid [hydromorphone hcl]  Shortness Of Breath and Swelling 12/26/2012   Hydrocodone Shortness Of Breath, Swelling, and Rash 12/26/2012   Morphine and codeine Shortness Of Breath, Swelling, and Rash 12/26/2012   Oxycodone Shortness Of Breath, Swelling, and Rash 12/26/2012   Penicillins Shortness Of Breath, Swelling, and Other (See Comments)    Acetaminophen  12/26/2012   Ativan [lorazepam] Other (See Comments) 12/20/2016   Humira [adalimumab]  12/24/2021   Strawberry extract Swelling 07/22/2011   Watermelon concentrate Swelling 07/22/2011   Albuterol Palpitations 07/08/2021   Benadryl [diphenhydramine hcl] Palpitations 09/23/2011   Latex Rash    Remicade [infliximab] Rash 07/08/2021   Tramadol Rash 01/12/2022    Review of Systems:    Constitutional: No weight loss, fever, chills, weakness or fatigue HEENT: Eyes: No change in vision               Ears, Nose, Throat:  No change in hearing or congestion Skin: No rash or itching Cardiovascular: No chest pain, chest pressure or palpitations   Respiratory: No SOB or cough Gastrointestinal: See HPI and otherwise negative Genitourinary: No dysuria or change in urinary frequency Neurological: No headache, dizziness or syncope Musculoskeletal: No new muscle or joint pain Hematologic: No bleeding or bruising Psychiatric: No history of depression or anxiety     Physical Exam:  Vital signs in last 24 hours: Temp:  [96.6 F (35.9 C)-100.5 F (38.1 C)] 99.5 F (37.5 C) (09/26 1100) Pulse Rate:  [114-146] 123 (09/26 1100) Resp:  [0-35] 28 (09/26 1100) BP: (44-157)/(29-97) 110/56 (09/26 1100) SpO2:  [95 %-100 %] 100 % (09/26 1100) FiO2 (%):  [50 %-100 %] 50 % (09/26 1055) Weight:  [63.5 kg-68.9 kg] 68.9 kg (09/26 1102) Last BM Date :  (PTA) Last BM recorded by nurses in past 5 days No data recorded  General: chronically ill, frail intubated, female in no acute distress Head:  Normocephalic and atraumatic. Eyes: sclerae anicteric,conjunctive pink  Mouth: dried  brown blood around tubes  Heart:  tachycardiac, regular rhythm Pulm: diminished lung sounds, wheezing upper lobes Abdomen:  soft with mild distention , AB, Hypoactive bowel sounds. Without guarding and Without rebound, No organomegaly appreciated. Extremities:  Without edema. Msk:  Symmetrical without gross deformities. Peripheral pulses intact.  Skin:   Dry and intact without significant lesions or rashes.  LAB RESULTS: Recent Labs    01/27/23 0448 01/27/23 0449 01/27/23 1140 01/27/23 1143  WBC 17.2*  --   --  11.3*  HGB 13.4 13.9 13.9 14.0  HCT 40.8 41.0 41.0 40.9  PLT 435*  --   --  312   BMET Recent Labs    01/27/23 0448 01/27/23 0449 01/27/23 1140  NA 130* 128* 129*  K 3.7 3.8 3.3*  CL 90* 94*  --   CO2 11*  --   --   GLUCOSE 173* 168*  --   BUN 56* 51*  --   CREATININE 3.37* 3.40*  --   CALCIUM 7.7*  --   --  LFT Recent Labs    01/27/23 0448  PROT 5.2*  ALBUMIN 2.4*  AST 37  ALT 16  ALKPHOS 217*  BILITOT 0.7   PT/INR Recent Labs    01/27/23 0615  LABPROT 18.3*  INR 1.5*    STUDIES: DG Chest Port 1 View  Result Date: 01/27/2023 CLINICAL DATA:  Acute respiratory failure EXAM: PORTABLE CHEST 1 VIEW COMPARISON:  Chest radiograph dated 01/27/2023 FINDINGS: Lines/tubes: Endotracheal tube tip projects 3.8 cm above the carina. Insert Gastric/enteric tube tip projects over the stomach. Right internal jugular venous catheter tip projects over the superior cavoatrial junction. Lungs: Well inflated lungs. Left lower lung linear opacity, unchanged. Pleura: No pneumothorax or pleural effusion. Heart/mediastinum: The heart size and mediastinal contours are within normal limits. Bones: No acute osseous abnormality. IMPRESSION: 1. Lines and tubes as described. 2. Unchanged left lower lung linear opacity, likely atelectasis. Electronically Signed   By: Agustin Cree M.D.   On: 01/27/2023 12:25   CT Head Wo Contrast  Result Date: 01/27/2023 CLINICAL DATA:  Mental status  change.  Un known etiology. EXAM: CT HEAD WITHOUT CONTRAST TECHNIQUE: Contiguous axial images were obtained from the base of the skull through the vertex without intravenous contrast. RADIATION DOSE REDUCTION: This exam was performed according to the departmental dose-optimization program which includes automated exposure control, adjustment of the mA and/or kV according to patient size and/or use of iterative reconstruction technique. COMPARISON:  12/23/2021 FINDINGS: Brain: No evidence of acute infarction, hemorrhage, hydrocephalus, extra-axial collection or mass lesion/mass effect. Vascular: No hyperdense vessel or unexpected calcification. Skull: Normal. Negative for fracture or focal lesion. Sinuses/Orbits: Endotracheal tube is identified. Air-fluid level noted within the right maxillary sinus. Mastoid air cells are clear. Other: None. IMPRESSION: 1. No acute intracranial abnormalities. 2. Air-fluid level within the right maxillary sinus. Correlate for any clinical signs or symptoms of acute sinusitis. Electronically Signed   By: Signa Kell M.D.   On: 01/27/2023 07:05   DG Abdomen 1 View  Result Date: 01/27/2023 CLINICAL DATA:  Evaluate OG tube placement EXAM: ABDOMEN - 1 VIEW COMPARISON:  CT [facility] 01/15/2022 FINDINGS: Enteric tube tip is in the gastric fundus. Side port is approximately 3.1 cm below the hemidiaphragms. Mildly dilated small bowel loops are noted within the right hemiabdomen. These measure up to 2.7 cm. IMPRESSION: 1. Enteric tube tip is in the gastric fundus. 2. Mildly dilated small bowel loops within the right hemiabdomen. Electronically Signed   By: Signa Kell M.D.   On: 01/27/2023 06:22   DG Chest Portable 1 View  Result Date: 01/27/2023 CLINICAL DATA:  Evaluate OG tube placement EXAM: PORTABLE CHEST 1 VIEW COMPARISON:  12/23/2021 FINDINGS: ET tube tip is in satisfactory position approximately 4.3 cm above the carina. There is a right IJ catheter with tip at the cavoatrial  junction. Enteric tube tip and side port are below the level of the GE junction. Heart size and mediastinal contours appear normal. No pleural fluid, interstitial edema or airspace disease. Visualized osseous structures appear intact. IMPRESSION: 1. No acute cardiopulmonary disease. 2. Enteric tube tip and side port are below the level of the GE junction. Electronically Signed   By: Signa Kell M.D.   On: 01/27/2023 06:20      Impression /Plan:  52 year old presents with history of Crohn's complicated by colonic stricture, on Skyrizi, followed by [facility] who presented with coffee ground emesis and melena. Positive fecal occult. Stable Hgb 14.0. Back in 8/23 was 16.1. Received 1 unit PRBCs  at [facility] before transfer. -Continue PPI therapy IV BID -Trend CBC -Transfuse as needed. -consider EGD in future this hospitalization, not urgent unless s/s of active bleeding  Crohn's disease,complicated by colonic stricture and on Skyrizi. -will follow-up with [facility] upon discharge  Mild coagulopathy.  INR 1.5/PT 18.3 -Trend INR/PT  Septic shock/brief PEA arrest/Lactic acidosis. WBC 11.3. On pressors, intubated. On Flagyl and Rocephin IV. Lactic acidosis is down trending to 5.2. -CT scan Abd/pelvis pending -Check GI panel with cdiff  AKI.  BUN 45, creatinine 1.87.  -continue to monitor  Hyponatremia -Sodium today 130  Other Comorbidities: History of seizures on home Keppra, hypertension holding home blood pressure medication  Principal Problem:   Cardiac arrest [facility]) Active Problems:   Shock (HCC)   Acute respiratory failure with hypoxia (HCC)    LOS: 0 days   Thank you for your kind consultation, we will continue to follow.  Deletha Jaffee J Janelly Switalski  01/27/2023, 12:52 PM

## 2023-01-27 NOTE — H&P (Signed)
[name], MRN:  [id number], DOB:  1971/04/07, LOS: 0 ADMISSION DATE:  01/27/2023, CONSULTATION DATE:  01/27/23 REFERRING MD:  [facility], CHIEF COMPLAINT:  post arrest    History of Present Illness:  52yo female with hx COPD, chron's, HTN, DM presented to [facility] via EMS after being called by family for pt with ?GI bleeding over last few days. Initially pt was refusing to go to the hospital but became unresponsive and family called EMS.  On EMS arrival she was found in large pool of dark bloody emesis +/- diarrhea with SBP 60's and minimally responsive.  On arrival to ER she was unresponsive with agonal breathing, pulseless. She had brief 1 round of CPR ~56mins, 1 epi with ROSC. She was intubated in ER and PCCM called for tx/admission to [facility].   ER w/u revealed anion gap acidosis and AKI with Scr 3.37 Normal Hgb   Requiring levophed, 2L IVF given CXR and CT head wnl Lactate 8.1->5.2, ammonia 57  Pertinent  Medical History   has a past medical history of Anxiety, Asthma, Chronic low back pain, Collagen vascular disease (HCC), COPD (chronic obstructive pulmonary disease) (HCC), Crohn's disease (HCC), GERD (gastroesophageal reflux disease), History of head injury, Hypertension, IBS (irritable bowel syndrome), PSVT (paroxysmal supraventricular tachycardia), PTSD (post-traumatic stress disorder), Recurrent chest pain, Seizure disorder (HCC), and Type 2 diabetes mellitus (HCC).   Significant Hospital Events: Including procedures, antibiotic start and stop dates in addition to other pertinent events   CT head 9/26>>>neg acute   Interim History / Subjective:  Arrived from [facility]. Hemodynamics slightly improved, weaning levophed.   Objective   Blood pressure (!) 110/56, pulse (!) 123, temperature 99.5 F (37.5 C), temperature source Bladder, resp. rate (!) 28, height 5\' 3"  (1.6 m), weight 68.9 kg, SpO2 100%.    Vent Mode: PRVC FiO2 (%):  [50 %-100 %] 50 % Set Rate:  [15 bmp] 15  bmp Vt Set:  [420 mL] 420 mL PEEP:  [5 cmH20] 5 cmH20 Plateau Pressure:  [13 cmH20-17 cmH20] 13 cmH20   Intake/Output Summary (Last 24 hours) at 01/27/2023 1132 Last data filed at 01/27/2023 4098 Gross per 24 hour  Intake 3765 ml  Output 650 ml  Net 3115 ml   Filed Weights   01/27/23 0534 01/27/23 1102  Weight: 63.5 kg 68.9 kg    Examination: General: chronically ill appearing female, NAD on vent post tx from OSH HENT: mm moist, ETT Lungs: resps even non labored on vent, diminished bases otherwise clear Cardiovascular: s1s2 rrr Abdomen: soft, mildly distended, nontender  Extremities: warm and dry, no sig edema  Neuro: sedated, RASS -1, follows commands   Resolved Hospital Problem list     Assessment & Plan:  Septic shock  Brief PEA arrest  P:  Continue levophed, titrate as able for MAP >65 Gentle volume  Trend lactate  Follow cultures  Repeat labs   Acute respiratory failure  Hx COPD  P:  Vent support - 8cc/kg  F/u CXR  F/u ABG PRN albuterol  PAD protocol for sedation on vent   ?GI bleed - hx coffee ground emesis, melena P CT abd/pelvis  Trend CBC  GI to see  BID PPI   AKI  Lactic acidosis  Hyponatremia  P:  Repeat chem  Volume  BP mgmt  Trend lactate   hyperammonemia P:  F/u LFT's, trend ammonia   Seizure hx  P: Resume home keppra per pharmacy   Hx HTN  P:  Holding  home anti-HTN    Best Practice (right click and "Reselect all SmartList Selections" daily)   Diet/type: NPO DVT prophylaxis: SCD GI prophylaxis: PPI Lines: Central line Foley:  Yes, and it is still needed Code Status:  full code Last date of multidisciplinary goals of care discussion [no family present on arrival to [facility] 9/26 will update later]  Labs   CBC: Recent Labs  Lab 01/27/23 0448 01/27/23 0449  WBC 17.2*  --   NEUTROABS 11.9*  --   HGB 13.4 13.9  HCT 40.8 41.0  MCV 85.7  --   PLT 435*  --     Basic Metabolic Panel: Recent Labs  Lab 01/27/23 0448  01/27/23 0449  NA 130* 128*  K 3.7 3.8  CL 90* 94*  CO2 11*  --   GLUCOSE 173* 168*  BUN 56* 51*  CREATININE 3.37* 3.40*  CALCIUM 7.7*  --    GFR: Estimated Creatinine Clearance: 17.8 mL/min (A) (by C-G formula based on SCr of 3.4 mg/dL (H)). Recent Labs  Lab 01/27/23 0448 01/27/23 0615 01/27/23 0819  WBC 17.2*  --   --   LATICACIDVEN  --  8.1* 5.2*    Liver Function Tests: Recent Labs  Lab 01/27/23 0448  AST 37  ALT 16  ALKPHOS 217*  BILITOT 0.7  PROT 5.2*  ALBUMIN 2.4*   No results for input(s): "LIPASE", "AMYLASE" in the last 168 hours. Recent Labs  Lab 01/27/23 0625  AMMONIA 57*    ABG    Component Value Date/Time   PHART 7.34 (L) 01/27/2023 0835   PCO2ART 36 01/27/2023 0835   PO2ART 213 (H) 01/27/2023 0835   HCO3 18.9 (L) 01/27/2023 0835   TCO2 16 (L) 01/27/2023 0449   ACIDBASEDEF 6.1 (H) 01/27/2023 0835   O2SAT 100 01/27/2023 0835     Coagulation Profile: Recent Labs  Lab 01/27/23 0615  INR 1.5*    Cardiac Enzymes: No results for input(s): "CKTOTAL", "CKMB", "CKMBINDEX", "TROPONINI" in the last 168 hours.  HbA1C: Hgb A1c MFr Bld  Date/Time Value Ref Range Status  12/23/2021 03:29 PM 4.7 (L) 4.8 - 5.6 % Final    Comment:    (NOTE) Pre diabetes:          5.7%-6.4%  Diabetes:              >6.4%  Glycemic control for   <7.0% adults with diabetes   02/03/2021 12:06 PM 5.7 (H) 4.8 - 5.6 % Final    Comment:    (NOTE) Pre diabetes:          5.7%-6.4%  Diabetes:              >6.4%  Glycemic control for   <7.0% adults with diabetes     CBG: Recent Labs  Lab 01/27/23 0928 01/27/23 1052  GLUCAP 139* 152*    Review of Systems:   As per HPI obtained from records and staff  Past Medical History:  She,  has a past medical history of Anxiety, Asthma, Chronic low back pain, Collagen vascular disease (HCC), COPD (chronic obstructive pulmonary disease) (HCC), Crohn's disease (HCC), GERD (gastroesophageal reflux disease), History of  head injury, Hypertension, IBS (irritable bowel syndrome), PSVT (paroxysmal supraventricular tachycardia), PTSD (post-traumatic stress disorder), Recurrent chest pain, Seizure disorder (HCC), and Type 2 diabetes mellitus (HCC).   Surgical History:   Past Surgical History:  Procedure Laterality Date   ABDOMINAL HYSTERECTOMY     with right salpingo oophorectomy 2005   APPENDECTOMY  2006  BALLOON DILATION N/A 02/19/2021   Procedure: BALLOON DILATION;  Surgeon: Lanelle Bal, DO;  Location: [facility];  Service: Endoscopy;  Laterality: N/A;  Sigmoid colon stricture   BIOPSY  02/06/2021   Procedure: BIOPSY;  Surgeon: Lanelle Bal, DO;  Location: [facility];  Service: Endoscopy;;   BIOPSY  02/19/2021   Procedure: BIOPSY;  Surgeon: Lanelle Bal, DO;  [facility];  Service: Endoscopy;;   BIOPSY  07/15/2021   Procedure: BIOPSY;  Surgeon: Malissa Hippo, MD;  Location: [facility];  Service: Endoscopy;;   CESAREAN SECTION     1996   CHOLECYSTECTOMY     COLONOSCOPY  2010   Dr. Jena Gauss; negative except for hemorrhoids   ESOPHAGEAL DILATION N/A 10/25/2014   Procedure: ESOPHAGEAL DILATION;  Surgeon: Malissa Hippo, MD;  [facility];  Service: Endoscopy;  Laterality: N/A;   ESOPHAGOGASTRODUODENOSCOPY N/A 10/25/2014   Procedure: ESOPHAGOGASTRODUODENOSCOPY (EGD);  Surgeon: Malissa Hippo, MD;  [facility]: [facility];  Service: Endoscopy;  Laterality: N/A;  1250   FLEXIBLE SIGMOIDOSCOPY N/A 02/06/2021   Procedure: FLEXIBLE SIGMOIDOSCOPY;  Surgeon: Lanelle Bal, DO;  [facility];  Service: Endoscopy;  Laterality: N/A;   FLEXIBLE SIGMOIDOSCOPY N/A 02/19/2021   Procedure: FLEXIBLE SIGMOIDOSCOPY;  Surgeon: Lanelle Bal, DO;  [facility];  Service: Endoscopy;  Laterality: N/A;   OOPHORECTOMY     left for torsion and ovarian fibroma; uterine myoma resected 1995   SIGMOIDOSCOPY  07/15/2021   Procedure: SIGMOIDOSCOPY;  Surgeon:  Malissa Hippo, MD;  Location: [facility];  Service: Endoscopy;;     Social History:   reports that she has been smoking cigarettes. She started smoking about 38 years ago. She has a 19.2 pack-year smoking history. She has been exposed to tobacco smoke. She has never used smokeless tobacco. She reports current drug use. Drug: Marijuana. She reports that she does not drink alcohol.   Family History:  Her family history includes Cancer in her mother; Cancer (age of onset: 36) in her father; Crohn's disease in her cousin; Diabetes in her father; Heart attack in her mother; Heart attack (age of onset: 66) in her father; Heart failure in her father and mother; Hypertension in her mother.   Allergies Allergies  Allergen Reactions   Codeine Shortness Of Breath, Swelling and Rash    Throat swelling   Dilaudid [Hydromorphone Hcl] Shortness Of Breath and Swelling   Hydrocodone Shortness Of Breath, Swelling and Rash   Morphine And Codeine Shortness Of Breath, Swelling and Rash   Oxycodone Shortness Of Breath, Swelling and Rash    Patient states ALL pain medications make her deathly sick.   Penicillins Shortness Of Breath, Swelling and Other (See Comments)    Has patient had a PCN reaction causing immediate rash, facial/tongue/throat swelling, SOB or lightheadedness with hypotension: Yes Has patient had a PCN reaction causing severe rash involving mucus membranes or skin necrosis: Yes Has patient had a PCN reaction that required hospitalization Yes Has patient had a PCN reaction occurring within the last 10 years: No If all of the above answers are "NO", then may proceed with Cephalosporin use.   Throat swells  02/06/21--TOLERATES CEFTRIAXONE     Acetaminophen     Per MD patient states she can't take Tylenol because of liver enzymes   Ativan [Lorazepam] Other (See Comments)    Migraines.   Humira [Adalimumab]     rash   Strawberry Extract  Swelling   Watermelon Concentrate Swelling    Albuterol Palpitations   Benadryl [Diphenhydramine Hcl] Palpitations   Latex Rash   Remicade [Infliximab] Rash    Blisters and Welts    Tramadol Rash    Rash and itching     Home Medications  Prior to Admission medications   Medication Sig Start Date End Date Taking? Authorizing Provider  acetaminophen (TYLENOL) 500 MG tablet Take by mouth. 11/27/21  Yes [provider]  ALPRAZolam (XANAX) 1 MG tablet Take 1 mg by mouth 3 (three) times daily as needed for anxiety. 07/11/19  Yes [provider]  amitriptyline (ELAVIL) 100 MG tablet Take 100 mg by mouth at bedtime.   Yes [provider]  budesonide (ENTOCORT EC) 3 MG 24 hr capsule Take 9 mg by mouth daily.   Yes [provider]  cyclobenzaprine (FLEXERIL) 10 MG tablet Take 10 mg by mouth 3 (three) times daily as needed.   Yes [provider]  hydrOXYzine (ATARAX) 25 MG tablet Take 25 mg by mouth 3 (three) times daily as needed for anxiety. 12/13/21  Yes [provider]  hydrOXYzine (ATARAX) 50 MG tablet Take 25-50 mg by mouth 3 (three) times daily as needed.   Yes [provider]  levETIRAcetam (KEPPRA) 750 MG tablet Take 750 mg by mouth 2 (two) times daily. 09/30/20  Yes [provider]  ondansetron (ZOFRAN-ODT) 4 MG disintegrating tablet Take 4 mg by mouth every 8 (eight) hours as needed for nausea or vomiting. 09/07/21  Yes [provider]  pantoprazole (PROTONIX) 40 MG tablet Take 1 tablet (40 mg total) by mouth 2 (two) times daily. 01/03/22  Yes Vassie Loll, MD  acetaminophen (TYLENOL) 325 MG tablet Take 2 tablets (650 mg total) by mouth every 6 (six) hours as needed for mild pain or headache (or Fever >/= 101). 01/03/22   Vassie Loll, MD  cetirizine (ZYRTEC) 10 MG tablet Take 10 mg by mouth daily. 01/08/22   [provider]  clotrimazole-betamethasone (LOTRISONE) cream SMARTSIG:Sparingly Topical Twice Daily PRN    [provider]  DULoxetine  (CYMBALTA) 30 MG capsule Take 30 mg by mouth daily. 12/17/21   [provider]  feeding supplement (ENSURE ENLIVE / ENSURE PLUS) LIQD Take 237 mLs by mouth 2 (two) times daily between meals. 01/04/22   Vassie Loll, MD  folic acid (FOLVITE) 1 MG tablet Take 1 tablet (1 mg total) by mouth daily. 10/03/21   Catarina Hartshorn, MD  gabapentin (NEURONTIN) 300 MG capsule TAKE (1) CAPSULE BY MOUTH TWICE DAILY. 10/15/22   [provider]  metoprolol succinate (TOPROL XL) 25 MG 24 hr tablet Take 1 tablet (25 mg total) by mouth daily. 04/23/22   Jonelle Sidle, MD  metoprolol tartrate (LOPRESSOR) 25 MG tablet Take 25 mg by mouth 2 (two) times daily.    [provider]  Multiple Vitamins-Minerals (SUPER THERA VITE M) TABS Take 1 tablet by mouth daily. 12/13/21   [provider]  nitroGLYCERIN (NITROSTAT) 0.4 MG SL tablet Place 1 tablet (0.4 mg total) under the tongue every 5 (five) minutes as needed for chest pain. 03/19/21   Malissa Hippo, MD  omeprazole (PRILOSEC) 40 MG capsule Take 40 mg by mouth 2 (two) times daily.    [provider]  polyethylene glycol powder (GLYCOLAX/MIRALAX) 17 GM/SCOOP powder Take by mouth. 11/28/21   [provider]  RESTASIS 0.05 % ophthalmic emulsion Place 1 drop into both eyes 2 (two) times daily. 12/12/20  [provider]  Risankizumab-rzaa (SKYRIZI) 360 MG/2.4ML SOCT Inject 2.4 mLs into the skin See admin instructions. Every 8 weeks 07/20/22   [provider]  SPIRIVA RESPIMAT 2.5 MCG/ACT AERS Inhale 1 puff into the lungs daily. 01/03/22   Vassie Loll, MD  triazolam (HALCION) 0.25 MG tablet Take 0.25 mg by mouth daily.    [provider]  Vitamin D, Ergocalciferol, (DRISDOL) 1.25 MG (50000 UNIT) CAPS capsule Take 50,000 Units by mouth once a week. Takes on Wednesdays. 12/14/21   [provider]     Critical care time: 36 min    Dirk Dress, NP Pulmonary/Critical Care Medicine  01/27/2023   11:32 AM   See Loretha Stapler for personal pager 8a-4p PCCM on call pager [phone] until 7pm. Please call Elink 7p-7a. [phone]

## 2023-01-27 NOTE — Progress Notes (Addendum)
Became unresponsive @ home . EMS called by family. PEA ,ARF, ? GI bleed.   01/27/23 1128  TOC Brief Assessment  Insurance and Status Reviewed  Patient has primary care physician Yes  Home environment has been reviewed Resides with a friend  Prior level of function: PTA independent with ADL's. Owns a cane and  RW.  Prior/Current Home Services No current home services  Social Determinants of Health Reivew SDOH reviewed no interventions necessary  Readmission risk has been reviewed No  Transition of care needs no transition of care needs at this time   Pt currently on ventilator, propofol IV abx, vasopressin, levo, restaints. Supportive daughter,Amber Green,[phone]. Per  daughter pt resides in her home with a friend. States without transportation issues or problems getting RX meds.  TOC team following and will assist with needs as they present. Gae Gallop RN,BSN,CM [phone]

## 2023-01-27 NOTE — Progress Notes (Signed)
IO on L proximal tibia D/C'd per MD order. No s/s of bleeding and infection noted. RN aware.

## 2023-01-27 NOTE — ED Notes (Signed)
Out with [facility], VSS, no changes, tolerating vent

## 2023-01-27 NOTE — ED Notes (Signed)
Report received. Care assumed. EDP at [facility]. Pt resting/ sedated comfortably on vent. Central line and IV sites unremarkable. VSS/ improved.

## 2023-01-27 NOTE — ED Triage Notes (Addendum)
Arrives [facility] emergency traffic from home after being called out by someone at residence. Found in large pool of dark suspected bloody vomit and diarrhea.  Hx chrons.   Vomiting x 3 days but has not wanted to go to seek evaluation.   Initial sbp of 62 on arrival. CPR initiated upon ED arrival. 1 epi given and 1 round of cpr administered.

## 2023-01-27 NOTE — ED Provider Notes (Signed)
  Physical Exam  BP (!) 154/45   Pulse (!) 122   Temp 99.5 F (37.5 C)   Resp (!) 26   Ht 5\' 3"  (1.6 m)   Wt 63.5 kg   SpO2 100%   BMI 24.80 kg/m   Physical Exam  Procedures  .Critical Care  Performed by: Derwood Kaplan, MD Authorized by: Derwood Kaplan, MD   Critical care provider statement:    Critical care time (minutes):  35   Critical care was necessary to treat or prevent imminent or life-threatening deterioration of the following conditions:  Circulatory failure and metabolic crisis   Critical care was time spent personally by me on the following activities:  Development of treatment plan with patient or surrogate, discussions with consultants, evaluation of patient's response to treatment, examination of patient, ordering and review of laboratory studies, ordering and review of radiographic studies, ordering and performing treatments and interventions, pulse oximetry, re-evaluation of patient's condition and review of old charts   ED Course / MDM    Medical Decision Making Amount and/or Complexity of Data Reviewed Labs: ordered. Radiology: ordered.  Risk Prescription drug management. Decision regarding hospitalization.   Pt arrives to the ER pulseless, call made for ams and hypotension. Per EMS - there was bloody emesis around her.  Intubated. 1 round if CPR. Has AKI, uremia. Hb is normal. Broad spectrum antibiotics given. Currently on norepi. 2 liters of bolus here, 500 cc of EMS bolus. Pt has metabolic acidosis, + AG, but normal blood sugar.  CXR and CT head - clear.    EKG Interpretation Date/Time:  Thursday January 27 2023 05:15:31 EDT Ventricular Rate:  141 PR Interval:  117 QRS Duration:  111 QT Interval:  387 QTC Calculation: 593 R Axis:   243  Text Interpretation: Sinus tachycardia RSR' in V1 or V2, right VCD or RVH Probable inferior infarct, acute Lateral leads are also involved Prolonged QT interval increased rate and ST changes from  march of this year (likely related to levophed post-ROSC) Confirmed by Marily Memos [phone]) on 01/27/2023 5:55:14 AM       8:16 AM Adding trop. ABG ordered. Repeat EKG has improved.   EKG Interpretation Date/Time:  Thursday January 27 2023 08:12:56 EDT Ventricular Rate:  116 PR Interval:  161 QRS Duration:  95 QT Interval:  353 QTC Calculation: 491 R Axis:   79  Text Interpretation: Sinus tachycardia Probable left atrial enlargement Low voltage, extremity and precordial leads Borderline prolonged QT interval Nonspecific ST abnormality Confirmed by Derwood Kaplan [phone]) on 01/27/2023 8:16:36 AM       9:48 AM Patient is ABG reveals improved pH of 7.34. Her O2 sats have been reduced to 50%. Lactic acid has improved to 5.2. Still on pressors, I requested that we try to wean nor epi slightly. Troponin is 17, reassuring.  Patient now has an inpatient bed assigned.  Will defer additional labs, including repeat CBC to them.  Currently his second troponin, COVID-19 and CK are pending.     Derwood Kaplan, MD 01/27/23 [phone]

## 2023-01-27 NOTE — ED Provider Notes (Signed)
Emergency Department Provider Note   I have reviewed the triage vital signs and the nursing notes.   HISTORY  Chief Complaint Respiratory Arrest   HPI [name] is a 52 y.o. female who presents to the ER with EMS.  She is unresponsive and not able to give a history.  EMS states that someone else that was in the house called them.  Stated she had been throwing up dark stuff or blood for a few days but refused to go to the hospital.  Today she became unresponsive so they called EMS.  With EMS her blood pressure was 60s systolic over 40s.  She was unresponsive with a large amount of black vomitus around her.  They started IO started bagging her start some fluids and brought her here for further evaluation. LEVEL V CAVEAT APPLIES SECONDARY TO unresponsive   Past Medical History:  Diagnosis Date   Anxiety    Asthma    Chronic low back pain    Collagen vascular disease (HCC)    COPD (chronic obstructive pulmonary disease) (HCC)    Crohn's disease (HCC)    GERD (gastroesophageal reflux disease)    EGD 01/2007 by Dr.Rourke small hiatal hernia s/p 56 french maloney    History of head injury    Hypertension    IBS (irritable bowel syndrome)    PSVT (paroxysmal supraventricular tachycardia)    PTSD (post-traumatic stress disorder)    Recurrent chest pain    Seizure disorder (HCC)    Type 2 diabetes mellitus (HCC)     Patient Active Problem List   Diagnosis Date Noted   GI bleed 01/15/2022   Rectal bleeding 01/12/2022   Acute deep vein thrombosis (DVT) of brachial vein of right upper extremity (HCC)    Enteritis due to Clostridium difficile 12/24/2021   Proctocolitis 12/23/2021   UTI (urinary tract infection) 12/23/2021   Thrombocytosis 10/02/2021   Gait instability 10/02/2021   Hypokalemia 10/01/2021   Skin test reaction, systemic 09/07/2021   N&V (nausea and vomiting) 04/30/2021   Abdominal pain    Bloating    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HCC)    Hematochezia  02/04/2021   Crohn's disease of large intestine with other complication (HCC) 02/03/2021   Diarrhea    Acute metabolic encephalopathy 01/20/2021   Moderate Pericardial effusion 01/19/2021   Right foot pain    AKI (acute kidney injury) (HCC) 01/16/2021   Rhabdomyolysis 01/16/2021   Lactic acidosis 01/16/2021   COVID-19 virus infection 01/16/2021   Septic shock (HCC) 01/16/2021   Seizure (HCC) 06/24/2020   MDD (major depressive disorder) 03/24/2020   Chest pain 02/07/2020   Chronic patellofemoral pain of both knees 01/11/2020   Essential hypertension 06/17/2018   Insomnia 12/13/2017   Neuropathy 12/13/2017   Chest pain at rest 05/09/2016   COPD (chronic obstructive pulmonary disease) (HCC)    Crohn's disease with complication (HCC)    Diabetes mellitus without complication (HCC)    Anxiety    Cigarette smoker    PTSD (post-traumatic stress disorder)    Palpitation    Constipation 09/20/2012   Head injury 05/09/2012   Hyperlipidemia 04/11/2012   DJD (degenerative joint disease), lumbar 04/04/2012   Paroxysmal SVT (supraventricular tachycardia) 04/04/2012   Tobacco dependence 04/04/2012   DOE (dyspnea on exertion) 01/26/2012   Paresthesia 11/25/2011   Abdominal pain, chronic, epigastric 11/01/2011   Abdominal pain, acute, periumbilical 11/01/2011   GERD (gastroesophageal reflux disease)    Hyperglycemia    Chronic low back pain  IBS (irritable bowel syndrome)    Closed head injury    TOBACCO ABUSE 07/13/2010   Palpitations 07/13/2010   Pleuritic chest pain 07/13/2010   Anxiety state, unspecified 07/03/2008   BACK PAIN, LUMBAR, CHRONIC 07/03/2008    Past Surgical History:  Procedure Laterality Date   ABDOMINAL HYSTERECTOMY     with right salpingo oophorectomy 2005   APPENDECTOMY  2006   BALLOON DILATION N/A 02/19/2021   Procedure: BALLOON DILATION;  Surgeon: Lanelle Bal, DO;  [facility]: [facility];  Service: Endoscopy;  Laterality: N/A;  Sigmoid colon  stricture   BIOPSY  02/06/2021   Procedure: BIOPSY;  Surgeon: Lanelle Bal, DO;  Location: [facility];  Service: Endoscopy;;   BIOPSY  02/19/2021   Procedure: BIOPSY;  Surgeon: Lanelle Bal, DO;  [facility]: [facility];  Service: Endoscopy;;   BIOPSY  07/15/2021   Procedure: BIOPSY;  Surgeon: Malissa Hippo, MD;  Location: [facility];  Service: Endoscopy;;   CESAREAN SECTION     1996   CHOLECYSTECTOMY     COLONOSCOPY  2010   Dr. Jena Gauss; negative except for hemorrhoids   ESOPHAGEAL DILATION N/A 10/25/2014   Procedure: ESOPHAGEAL DILATION;  Surgeon: Malissa Hippo, MD;  Location: [facility];  Service: Endoscopy;  Laterality: N/A;   ESOPHAGOGASTRODUODENOSCOPY N/A 10/25/2014   Procedure: ESOPHAGOGASTRODUODENOSCOPY (EGD);  Surgeon: Malissa Hippo, MD;  [facility]: [facility];  Service: Endoscopy;  Laterality: N/A;  1250   FLEXIBLE SIGMOIDOSCOPY N/A 02/06/2021   Procedure: FLEXIBLE SIGMOIDOSCOPY;  Surgeon: Lanelle Bal, DO;  [facility]: [facility];  Service: Endoscopy;  Laterality: N/A;   FLEXIBLE SIGMOIDOSCOPY N/A 02/19/2021   Procedure: FLEXIBLE SIGMOIDOSCOPY;  Surgeon: Lanelle Bal, DO;  [facility];  Service: Endoscopy;  Laterality: N/A;   OOPHORECTOMY     left for torsion and ovarian fibroma; uterine myoma resected 1995   SIGMOIDOSCOPY  07/15/2021   Procedure: SIGMOIDOSCOPY;  Surgeon: Malissa Hippo, MD;  Location: [facility];  Service: Endoscopy;;    Current Outpatient Rx   Order #: [id number] Class: OTC   Order #: [id number] Class: Historical Med   Order #: [id number] Class: Historical Med   Order #: [id number] Class: Historical Med   Order #: [id number] Class: Historical Med   Order #: [id number] Class: OTC   Order #: [id number] Class: OTC   Order #: [id number] Class: Historical Med   Order #: [id number] Class: Historical Med   Order #: [id number] Class: Normal   Order #: [id number] Class: Normal   Order #: [id number] Class: Historical Med   Order #:  [id number] Class: Normal   Order #: [id number] Class: Historical Med   Order #: [id number] Class: Normal   Order #: [id number] Class: Historical Med    Allergies Codeine, Dilaudid [hydromorphone hcl], Hydrocodone, Morphine and codeine, Oxycodone, Penicillins, Acetaminophen, Ativan [lorazepam], Humira [adalimumab], Strawberry extract, Watermelon concentrate, Albuterol, Benadryl [diphenhydramine hcl], Latex, Remicade [infliximab], and Tramadol  Family History  Problem Relation Age of Onset   Cancer Mother    Heart failure Mother    Hypertension Mother    Heart attack Mother    Heart attack Father 60   Cancer Father 63   Heart failure Father    Diabetes Father    Crohn's disease Cousin     Social History Social History   Tobacco Use   Smoking status: Some Days    Current packs/day: 0.50    Average packs/day: 0.5 packs/day for 38.3 years (19.2 ttl pk-yrs)    Types: Cigarettes    Start date: 09/26/1984  Passive exposure: Current   Smokeless tobacco: Never  Vaping Use   Vaping status: Never Used  Substance Use Topics   Alcohol use: No    Alcohol/week: 0.0 standard drinks of alcohol   Drug use: Yes    Types: Marijuana    Review of Systems  LEVEL V CAVEAT APPLIES SECONDARY TO unresponsive ____________________________________________  PHYSICAL EXAM:  VITAL SIGNS: ED Triage Vitals  Encounter Vitals Group     BP 01/27/23 0440 (!) 117/59     Systolic BP Percentile --      Diastolic BP Percentile --      Pulse Rate 01/27/23 0440 (!) 135     Resp 01/27/23 0440 (!) 0     Temp 01/27/23 0518 (!) 96.6 F (35.9 C)     Temp Source 01/27/23 0539 Oral     SpO2 01/27/23 0450 100 %     Weight 01/27/23 0534 140 lb (63.5 kg)     Height 01/27/23 0452 5\' 3"  (1.6 m)    Constitutional: ill appearing. cachectic Eyes: Conjunctivae are normal. unreactivePupils.  Head: Atraumatic. Nose: No congestion/rhinnorhea. Mouth/Throat: Mucous membranes are dry, covered in dark vomit.  Oropharynx  non-erythematous. Neck: No stridor.  No meningeal signs.   Cardiovascular: tachy rate, regular rhythm.  Respiratory: minimal respiratory effort.  Lungs diminished and rhonchrous Gastrointestinal: Soft. No distention.  Musculoskeletal: No lower extremity edema. No gross deformities of extremities. Neurologic:  Not able to assess 2/2 condition.  Skin:   mottled Psych: Not able to assess 2/2 condition.   ____________________________________________   LABS (all labs ordered are listed, but only abnormal results are displayed)  Labs Reviewed  CBC - Abnormal; Notable for the following components:      Result Value   WBC 17.2 (*)    Platelets 435 (*)    nRBC 0.5 (*)    All other components within normal limits  BLOOD GAS, VENOUS - Abnormal; Notable for the following components:   pH, Ven 7.12 (*)    pCO2, Ven 42 (*)    pO2, Ven 57 (*)    Bicarbonate 14.0 (*)    Acid-base deficit 15.3 (*)    All other components within normal limits  I-STAT CHEM 8, ED - Abnormal; Notable for the following components:   Sodium 128 (*)    Chloride 94 (*)    BUN 51 (*)    Creatinine, Ser 3.40 (*)    Glucose, Bld 168 (*)    Calcium, Ion 0.84 (*)    TCO2 16 (*)    All other components within normal limits  CULTURE, BLOOD (ROUTINE X 2)  CULTURE, BLOOD (ROUTINE X 2)  ETHANOL  DIFFERENTIAL  COMPREHENSIVE METABOLIC PANEL  RAPID URINE DRUG SCREEN, HOSP PERFORMED  URINALYSIS, ROUTINE W REFLEX MICROSCOPIC  PROTIME-INR  APTT  LACTIC ACID, PLASMA  LACTIC ACID, PLASMA  AMMONIA  TYPE AND SCREEN  PREPARE RBC (CROSSMATCH)   ____________________________________________  EKG   EKG Interpretation Date/Time:  Thursday January 27 2023 05:15:31 EDT Ventricular Rate:  141 PR Interval:  117 QRS Duration:  111 QT Interval:  387 QTC Calculation: 593 R Axis:   243  Text Interpretation: Sinus tachycardia RSR' in V1 or V2, right VCD or RVH Probable inferior infarct, acute Lateral leads are also involved  Prolonged QT interval increased rate and ST changes from march of this year (likely related to levophed post-ROSC) Confirmed by Marily Memos [phone]) on 01/27/2023 5:55:14 AM        ____________________________________________  RADIOLOGY  No  results found. ____________________________________________   PROCEDURES  Procedure(s) performed:   .Critical Care  Performed by: Marily Memos, MD Authorized by: Marily Memos, MD   Critical care provider statement:    Critical care time (minutes):  80   Critical care was necessary to treat or prevent imminent or life-threatening deterioration of the following conditions:  Cardiac failure, respiratory failure, shock, dehydration, CNS failure or compromise and renal failure   Critical care was time spent personally by me on the following activities:  Development of treatment plan with patient or surrogate, discussions with consultants, evaluation of patient's response to treatment, examination of patient, ordering and review of laboratory studies, ordering and review of radiographic studies, ordering and performing treatments and interventions, pulse oximetry, re-evaluation of patient's condition and review of old charts CPR  Date/Time: 01/27/2023 5:56 AM  Performed by: Marily Memos, MD Authorized by: Marily Memos, MD  CPR Procedure Details:      Amount of time prior to administration of ACLS/BLS (minutes):  0   ACLS/BLS initiated by EMS: No     CPR/ACLS performed in the ED: Yes     Duration of CPR (minutes):  5   Outcome: ROSC obtained    CPR performed via ACLS guidelines under my direct supervision.  See RN documentation for details including defibrillator use, medications, doses and timing. Procedure Name: Intubation Date/Time: 01/27/2023 5:56 AM  Performed by: Marily Memos, MDPre-anesthesia Checklist: Patient identified, Patient being monitored, Emergency Drugs available, Timeout performed and Suction available Oxygen Delivery  Method: Non-rebreather mask Preoxygenation: Pre-oxygenation with 100% oxygen Induction Type: Rapid sequence Ventilation: Mask ventilation without difficulty Laryngoscope Size: Glidescope and 3 Grade View: Grade I Tube size: 7.0 mm Number of attempts: 1 Placement Confirmation: ETT inserted through vocal cords under direct vision, CO2 detector and Breath sounds checked- equal and bilateral Secured at: 23 cm Tube secured with: ETT holder Dental Injury: Teeth and Oropharynx as per pre-operative assessment  Future Recommendations: Recommend- induction with short-acting agent, and alternative techniques readily available    .Central Line  Date/Time: 01/27/2023 5:56 AM  Performed by: Marily Memos, MD Authorized by: Marily Memos, MD   Consent:    Consent obtained:  Verbal   Risks discussed:  Incorrect placement, infection, bleeding, arterial puncture, nerve damage and pneumothorax   Alternatives discussed:  No treatment, delayed treatment, alternative treatment and observation Universal protocol:    Patient identity confirmed:  Arm band Pre-procedure details:    Indication(s): insufficient peripheral access     Hand hygiene: Hand hygiene performed prior to insertion     Sterile barrier technique: All elements of maximal sterile technique followed     Skin preparation:  2% chlorhexidine   Skin preparation agent: Skin preparation agent completely dried prior to procedure   Sedation:    Sedation type:  None Anesthesia:    Anesthesia method:  None Procedure details:    Location:  R internal jugular   Site selection rationale:  Availability/cleanliness   Patient position:  Supine   Procedural supplies:  Triple lumen   Catheter size:  7 Fr   Ultrasound guidance: yes     Ultrasound guidance timing: real time     Sterile ultrasound techniques: Sterile gel and sterile probe covers were used     Number of attempts:  1   Successful placement: yes   Post-procedure details:     Post-procedure:  Dressing applied and line sutured   Assessment:  Blood return through all ports, free fluid flow and no pneumothorax on x-ray  Procedure completion:  Tolerated well, no immediate complications  ____________________________________________   INITIAL IMPRESSION / ASSESSMENT AND PLAN / ED COURSE  Pertinent labs & imaging results that were available during my care of the patient were reviewed by me and considered in my medical decision making (see chart for details).   Somebody with the patient called out for unresponsiveness.  Hypotensive and agonal breathing on EMS arrival.  Bagged and fluids started on the way here.  On arrival here she had agonal respirations.  Could not feel femoral or carotid pulse.  Epi given and chest compressions started.  Approximately 5 to 7 minutes of chest compressions with fluids and 1 dose of epinephrine and we got ROSC.  Patient was spontaneous breathing at that time.  Patient was intubated without any sedation or paralytics but was having spontaneous breathing.  Significant amount of dark vomitus in her oropharynx and all over her body.  Has been without difficulty.  Central line started for high dose Levophed and multiple other medications.  Blood hanging prior to labs, back.  Ultimately labs came back and showed that hemoglobin is 13.  Suspect this is probably a little bit false however we will hold on any more blood.  Patient did get a type and screen done.  Foley placed and temperature of 100.5 cultures, antibiotics and lactic acid are ordered.  Levophed titrated per nursing to maintain MAP 60-65.  Protonix given.  Also on vasopressin.  No phone numbers in the computer for me to call for more information or to inform anyone was going on.  Will add on head CT just to prognosticate although it is probably too early at this time.  X-ray viewed interpreted myself without any obvious pneumothorax.  Central line and ET tube appear to be in good position.  From  what I can tell the NG tube is also in good position.  Discussed with hospitalist who suggest admission to [facility] secondary to tenuous status and need for multiple specialties. PCCM consulted, will admit but may be delay 2/2 not having open beds.   Patient improving hemodynamics with fluids. Will give another liter and subsequently start infusion, Levophed has been decreased to 10  Improving spontaneous movements but not sure if these are purposeful or not. I don't see a bleed on my read, peding rads read.   Rads read reassuring. Care transferred pending transfer to [facility].   ____________________________________________  FINAL CLINICAL IMPRESSION(S) / ED DIAGNOSES  Final diagnoses:  Cardiac arrest (HCC)  Upper GI bleed  Shock (HCC)  AKI (acute kidney injury) (HCC)  Acidemia    MEDICATIONS GIVEN DURING THIS VISIT:  Medications  norepinephrine (LEVOPHED) 4mg  in (0.016 mg/mL) premix infusion (15 mcg/min Intravenous Rate/Dose Change 01/27/23 0542)  0.9 %  sodium chloride infusion (Manually program via Guardrails IV Fluids) (0 mLs Intravenous Hold 01/27/23 0502)  vasopressin (PITRESSIN) 20 Units in 100 mL (0.2 unit/mL) infusion-*FOR SHOCK* (0.03 Units/min Intravenous New Bag/Given 01/27/23 0517)  propofol (DIPRIVAN) 1000 MG/100ML infusion (20 mcg/kg/min  63.5 kg Intravenous New Bag/Given 01/27/23 0539)  vancomycin (VANCOREADY) IVPB 1250 mg/250 mL (has no administration in time range)  ceFEPIme (MAXIPIME) 2 g in sodium chloride 0.9 % 100 mL IVPB (has no administration in time range)  metroNIDAZOLE (FLAGYL) IVPB 500 mg (has no administration in time range)  fentaNYL in NS (20mcg/ml) infusion-PREMIX (25 mcg/hr Intravenous New Bag/Given 01/27/23 0557)  pantoprazole (PROTONIX) 80 mg /NS 100 mL IVPB (has no administration in time range)  EPINEPHrine (  ADRENALIN) 1 MG/10ML injection (1 mg Intravenous Given 01/27/23 0436)  sodium bicarbonate injection (50 mEq Intravenous Given  01/27/23 0437)  sodium chloride 0.9 % bolus 1,000 mL (0 mLs Intravenous Stopped 01/27/23 0537)  lactated ringers bolus 1,000 mL (1,000 mLs Intravenous New Bag/Given 01/27/23 0553)    NEW OUTPATIENT MEDICATIONS STARTED DURING THIS VISIT:  New Prescriptions   No medications on file    Note:  This document was prepared using Dragon voice recognition software and may include unintentional dictation errors.     Laverna Dossett, Barbara Cower, MD 01/28/23 [phone]

## 2023-01-28 DIAGNOSIS — I469 Cardiac arrest, cause unspecified: Secondary | ICD-10-CM | POA: Diagnosis not present

## 2023-01-28 DIAGNOSIS — K529 Noninfective gastroenteritis and colitis, unspecified: Secondary | ICD-10-CM

## 2023-01-28 DIAGNOSIS — K5010 Crohn's disease of large intestine without complications: Secondary | ICD-10-CM | POA: Insufficient documentation

## 2023-01-28 DIAGNOSIS — K922 Gastrointestinal hemorrhage, unspecified: Secondary | ICD-10-CM | POA: Diagnosis not present

## 2023-01-28 DIAGNOSIS — R579 Shock, unspecified: Secondary | ICD-10-CM | POA: Diagnosis not present

## 2023-01-28 DIAGNOSIS — K50118 Crohn's disease of large intestine with other complication: Secondary | ICD-10-CM | POA: Insufficient documentation

## 2023-01-28 LAB — GLUCOSE, CAPILLARY
Glucose-Capillary: 105 mg/dL — ABNORMAL HIGH (ref 70–99)
Glucose-Capillary: 115 mg/dL — ABNORMAL HIGH (ref 70–99)
Glucose-Capillary: 121 mg/dL — ABNORMAL HIGH (ref 70–99)
Glucose-Capillary: 122 mg/dL — ABNORMAL HIGH (ref 70–99)
Glucose-Capillary: 128 mg/dL — ABNORMAL HIGH (ref 70–99)
Glucose-Capillary: 138 mg/dL — ABNORMAL HIGH (ref 70–99)
Glucose-Capillary: 96 mg/dL (ref 70–99)

## 2023-01-28 LAB — CBC
HCT: 40.5 % (ref 36.0–46.0)
Hemoglobin: 13.5 g/dL (ref 12.0–15.0)
MCH: 28.2 pg (ref 26.0–34.0)
MCHC: 33.3 g/dL (ref 30.0–36.0)
MCV: 84.7 fL (ref 80.0–100.0)
Platelets: 235 10*3/uL (ref 150–400)
RBC: 4.78 MIL/uL (ref 3.87–5.11)
RDW: 14.6 % (ref 11.5–15.5)
WBC: 13.9 10*3/uL — ABNORMAL HIGH (ref 4.0–10.5)
nRBC: 1.5 % — ABNORMAL HIGH (ref 0.0–0.2)

## 2023-01-28 LAB — BASIC METABOLIC PANEL
Anion gap: 11 (ref 5–15)
BUN: 29 mg/dL — ABNORMAL HIGH (ref 6–20)
CO2: 20 mmol/L — ABNORMAL LOW (ref 22–32)
Calcium: 7 mg/dL — ABNORMAL LOW (ref 8.9–10.3)
Chloride: 100 mmol/L (ref 98–111)
Creatinine, Ser: 1.28 mg/dL — ABNORMAL HIGH (ref 0.44–1.00)
GFR, Estimated: 50 mL/min — ABNORMAL LOW (ref >60)
Glucose, Bld: 141 mg/dL — ABNORMAL HIGH (ref 70–99)
Potassium: 3.6 mmol/L (ref 3.5–5.1)
Sodium: 131 mmol/L — ABNORMAL LOW (ref 135–145)

## 2023-01-28 LAB — BLOOD CULTURE ID PANEL (REFLEXED) - BCID2

## 2023-01-28 LAB — PHOSPHORUS: Phosphorus: 4.8 mg/dL — ABNORMAL HIGH (ref 2.5–4.6)

## 2023-01-28 LAB — MAGNESIUM: Magnesium: 2.3 mg/dL (ref 1.7–2.4)

## 2023-01-28 LAB — BRAIN NATRIURETIC PEPTIDE: B Natriuretic Peptide: 558.2 pg/mL — ABNORMAL HIGH (ref 0.0–100.0)

## 2023-01-28 MED ORDER — POTASSIUM CHLORIDE 20 MEQ PO PACK
40.0000 meq | PACK | Freq: Once | ORAL | Status: DC
  Filled 2023-01-28: qty 2

## 2023-01-28 MED ORDER — POLYETHYLENE GLYCOL 3350 17 G PO PACK
17.0000 g | PACK | Freq: Every day | ORAL | Status: DC
  Administered 2023-01-28: 17 g
  Filled 2023-01-28 (×2): qty 1

## 2023-01-28 MED ORDER — METOPROLOL TARTRATE 5 MG/5ML IV SOLN
2.5000 mg | Freq: Once | INTRAVENOUS | Status: AC
  Administered 2023-01-28: 2.5 mg via INTRAVENOUS
  Filled 2023-01-28: qty 5

## 2023-01-28 MED ORDER — METOPROLOL TARTRATE 5 MG/5ML IV SOLN
2.5000 mg | INTRAVENOUS | Status: AC | PRN
  Administered 2023-01-28: 5 mg via INTRAVENOUS
  Administered 2023-01-29: 2.5 mg via INTRAVENOUS
  Administered 2023-01-30: 5 mg via INTRAVENOUS
  Administered 2023-01-30 – 2023-01-31 (×2): 2.5 mg via INTRAVENOUS
  Administered 2023-01-31: 5 mg via INTRAVENOUS
  Administered 2023-01-31 – 2023-02-01 (×3): 2.5 mg via INTRAVENOUS
  Administered 2023-02-02: 5 mg via INTRAVENOUS
  Filled 2023-01-28 (×8): qty 5

## 2023-01-28 MED ORDER — CALCIUM GLUCONATE-NACL 2-0.675 GM/100ML-% IV SOLN
2.0000 g | Freq: Once | INTRAVENOUS | Status: AC
  Administered 2023-01-28: 2000 mg via INTRAVENOUS
  Filled 2023-01-28: qty 100

## 2023-01-28 MED ORDER — SODIUM CHLORIDE 0.9 % IV SOLN
750.0000 mg | Freq: Two times a day (BID) | INTRAVENOUS | Status: DC
  Filled 2023-01-28: qty 7.5

## 2023-01-28 MED ORDER — SODIUM CHLORIDE 0.9 % IV SOLN
750.0000 mg | Freq: Two times a day (BID) | INTRAVENOUS | Status: DC
  Administered 2023-01-28 – 2023-01-30 (×6): 750 mg via INTRAVENOUS
  Filled 2023-01-28 (×10): qty 7.5

## 2023-01-28 MED ORDER — POTASSIUM CHLORIDE 10 MEQ/50ML IV SOLN
10.0000 meq | INTRAVENOUS | Status: AC
  Administered 2023-01-28 (×2): 10 meq via INTRAVENOUS
  Filled 2023-01-28 (×2): qty 50

## 2023-01-28 NOTE — Plan of Care (Signed)
  Problem: Safety: Goal: Ability to remain free from injury will improve Outcome: Progressing   Problem: Skin Integrity: Goal: Risk for impaired skin integrity will decrease Outcome: Progressing   

## 2023-01-28 NOTE — Progress Notes (Signed)
Patient transported to CT and back to [name] without any complications.

## 2023-01-28 NOTE — Progress Notes (Signed)
Progress Note   Subjective  Hospital day #2 Chief Complaint: Hematemesis and diarrhea  Today, patient found intubated and sedated.  She does have 300 cc of a dark black material that has been pulled from her OG overnight.  Per nursing staff no bowel movements.  CBC is still needing to be drawn from this morning.   Objective   Vital signs in last 24 hours: Temp:  [97 F (36.1 C)-101.8 F (38.8 C)] 97.9 F (36.6 C) (09/27 1150) Pulse Rate:  [112-271] 116 (09/27 1055) Resp:  [12-28] 13 (09/27 1000) BP: (79-140)/(31-119) 138/53 (09/27 1000) SpO2:  [84 %-100 %] 100 % (09/27 0800) FiO2 (%):  [40 %-50 %] 40 % (09/27 1055) Last BM Date :  (PTA) General:    Acutely ill-appearing, frail and intubated, white female in NAD Heart:  Regular rate and rhythm; no murmurs Lungs: Respirations even and unlabored, lungs CTA bilaterally Abdomen:  Soft, nontender and mild distention.  Decreased bowel sounds Psych: Intubated and sedated  Intake/Output from previous day: 09/26 0701 - 09/27 0700 In: 6921.3 [I.V.:2621.3; IV Piggyback:4300] Out: 2590 [Urine:2390; Emesis/NG output:200] Intake/Output this shift: Total I/O In: 100.3 [I.V.:100.3] Out: 240 [Urine:140; Emesis/NG output:100]  Lab Results: Recent Labs    01/27/23 0448 01/27/23 0449 01/27/23 1140 01/27/23 1143  WBC 17.2*  --   --  11.3*  HGB 13.4 13.9 13.9 14.0  HCT 40.8 41.0 41.0 40.9  PLT 435*  --   --  312   BMET Recent Labs    01/27/23 0448 01/27/23 0449 01/27/23 1140 01/27/23 1143 01/28/23 0403  NA 130* 128* 129* 130* 131*  K 3.7 3.8 3.3* 3.6 3.6  CL 90* 94*  --  100 100  CO2 11*  --   --  17* 20*  GLUCOSE 173* 168*  --  139* 141*  BUN 56* 51*  --  45* 29*  CREATININE 3.37* 3.40*  --  1.87* 1.28*  CALCIUM 7.7*  --   --  6.9* 7.0*   LFT Recent Labs    01/27/23 0448  PROT 5.2*  ALBUMIN 2.4*  AST 37  ALT 16  ALKPHOS 217*  BILITOT 0.7   PT/INR Recent Labs    01/27/23 0615  LABPROT 18.3*  INR 1.5*     Studies/Results: CT ABDOMEN PELVIS WO CONTRAST  Result Date: 01/28/2023 CLINICAL DATA:  Sepsis EXAM: CT ABDOMEN AND PELVIS WITHOUT CONTRAST TECHNIQUE: Multidetector CT imaging of the abdomen and pelvis was performed following the standard protocol without IV contrast. RADIATION DOSE REDUCTION: This exam was performed according to the departmental dose-optimization program which includes automated exposure control, adjustment of the mA and/or kV according to patient size and/or use of iterative reconstruction technique. COMPARISON:  None Available. FINDINGS: Lower chest: Bibasilar atelectasis. Small pericardial effusion. Cardiac size within normal limits. Hepatobiliary: No focal liver abnormality is seen. Status post cholecystectomy. No biliary dilatation. Pancreas: Unremarkable Spleen: Unremarkable Adrenals/Urinary Tract: The adrenal glands are unremarkable. The kidneys are normal on this noncontrast examination. Foley catheter balloon seen within a decompressed bladder lumen. Stomach/Bowel: Nasogastric tube tip seen within the proximal body of the stomach. There is moderate stool within the descending colon and rectum. There is circumferential bowel wall thickening and pericolonic inflammatory stranding involving the descending colon and rectosigmoid colon with extensive perirectal inflammatory stranding seen within the presacral space. The findings may reflect changes of an infectious or inflammatory proctocolitis including stercoral proctocolitis. More proximally, the small bowel is diffusely mildly dilated and fluid-filled as is the  proximal colon without a focal point of transition identified suggestive of a a underlying ileus. No free intraperitoneal gas. Trace free fluid noted within the pelvis. Vascular/Lymphatic: No significant vascular findings are present. No enlarged abdominal or pelvic lymph nodes. Reproductive: Status post hysterectomy. No adnexal masses. Other: No abdominal wall hernia  Musculoskeletal: No acute bone abnormality. No lytic or blastic bone lesion. Extensive infiltration noted within the visualized right upper extremity in the region of a intravenous catheter. IMPRESSION: 1. Moderate stool within the descending colon and rectum with circumferential bowel wall thickening and pericolonic inflammatory stranding involving the descending colon and rectosigmoid colon. The findings may reflect changes of an infectious or inflammatory proctocolitis including stercoral proctocolitis. 2. Superimposed adynamic ileus with diffusely mildly dilated fluid-filled small bowel. 3. Small pericardial effusion. 4. Bibasilar atelectasis. 5. Extensive infiltration within the visualized right upper extremity in the region of the indwelling IV catheter. Correlation with clinical examination is recommended. Electronically Signed   By: Helyn Numbers M.D.   On: 01/28/2023 00:52   DG Chest Port 1 View  Result Date: 01/27/2023 CLINICAL DATA:  Acute respiratory failure EXAM: PORTABLE CHEST 1 VIEW COMPARISON:  Chest radiograph dated 01/27/2023 FINDINGS: Lines/tubes: Endotracheal tube tip projects 3.8 cm above the carina. Insert Gastric/enteric tube tip projects over the stomach. Right internal jugular venous catheter tip projects over the superior cavoatrial junction. Lungs: Well inflated lungs. Left lower lung linear opacity, unchanged. Pleura: No pneumothorax or pleural effusion. Heart/mediastinum: The heart size and mediastinal contours are within normal limits. Bones: No acute osseous abnormality. IMPRESSION: 1. Lines and tubes as described. 2. Unchanged left lower lung linear opacity, likely atelectasis. Electronically Signed   By: Agustin Cree M.D.   On: 01/27/2023 12:25   CT Head Wo Contrast  Result Date: 01/27/2023 CLINICAL DATA:  Mental status change.  Un known etiology. EXAM: CT HEAD WITHOUT CONTRAST TECHNIQUE: Contiguous axial images were obtained from the base of the skull through the vertex  without intravenous contrast. RADIATION DOSE REDUCTION: This exam was performed according to the departmental dose-optimization program which includes automated exposure control, adjustment of the mA and/or kV according to patient size and/or use of iterative reconstruction technique. COMPARISON:  12/23/2021 FINDINGS: Brain: No evidence of acute infarction, hemorrhage, hydrocephalus, extra-axial collection or mass lesion/mass effect. Vascular: No hyperdense vessel or unexpected calcification. Skull: Normal. Negative for fracture or focal lesion. Sinuses/Orbits: Endotracheal tube is identified. Air-fluid level noted within the right maxillary sinus. Mastoid air cells are clear. Other: None. IMPRESSION: 1. No acute intracranial abnormalities. 2. Air-fluid level within the right maxillary sinus. Correlate for any clinical signs or symptoms of acute sinusitis. Electronically Signed   By: Signa Kell M.D.   On: 01/27/2023 07:05   DG Abdomen 1 View  Result Date: 01/27/2023 CLINICAL DATA:  Evaluate OG tube placement EXAM: ABDOMEN - 1 VIEW COMPARISON:  CT AP 01/15/2022 FINDINGS: Enteric tube tip is in the gastric fundus. Side port is approximately 3.1 cm below the hemidiaphragms. Mildly dilated small bowel loops are noted within the right hemiabdomen. These measure up to 2.7 cm. IMPRESSION: 1. Enteric tube tip is in the gastric fundus. 2. Mildly dilated small bowel loops within the right hemiabdomen. Electronically Signed   By: Signa Kell M.D.   On: 01/27/2023 06:22   DG Chest Portable 1 View  Result Date: 01/27/2023 CLINICAL DATA:  Evaluate OG tube placement EXAM: PORTABLE CHEST 1 VIEW COMPARISON:  12/23/2021 FINDINGS: ET tube tip is in satisfactory position approximately 4.3 cm above the  carina. There is a right IJ catheter with tip at the cavoatrial junction. Enteric tube tip and side port are below the level of the GE junction. Heart size and mediastinal contours appear normal. No pleural fluid,  interstitial edema or airspace disease. Visualized osseous structures appear intact. IMPRESSION: 1. No acute cardiopulmonary disease. 2. Enteric tube tip and side port are below the level of the GE junction. Electronically Signed   By: Signa Kell M.D.   On: 01/27/2023 06:20       Assessment / Plan:   Assessment: 1.  Coffee-ground emesis and melena: CT abdomen yesterday with moderate stool within the descending colon and rectum with circumferential bowel wall thickening and pericolonic inflammatory stranding involving the descending colon and rectosigmoid colon which may reflect changes of infectious or inflammatory proctocolitis including stercoral proctocolitis, superimposed adynamic ileus with diffusely mildly dilated small bowel, fecal occult positive, hemoglobin 14, recheck pending from today, nurses drawn this per her request, received 1 unit PRBCs before transfer from [facility], 300 cc of dark looking output from OG overnight; likely upper GI bleed 2.  Crohn's disease: Complicated by colonic stricture on Skyrizi and follows with [facility] 3.  Septic shock/brief PEA arrest/lactic acidosis: On Pressors and intubated, currently on Flagyl and Rocephin   Plan: 1.  May need to consider bedside EGD.  Pending results from repeat CBC today.  The nurses drawn this per our request. 2.  Agree with continued antibiotics 3.  No bowel movement as of yet but C. difficile is needing to be drawn-CT with adynamic ileus and also colitis 4.  Other supportive measures per critical care 5.  Continue Pantoprazole 40 mg IV twice daily  Thank you for your kind consultation, we will continue to follow.   LOS: 1 day   Unk Lightning  01/28/2023, 11:59 AM

## 2023-01-28 NOTE — Progress Notes (Signed)
NAMESenna [name], MRN:  [id number], DOB:  March 28, 1971, LOS: 1 ADMISSION DATE:  01/27/2023, CONSULTATION DATE: 01/27/2023 REFERRING MD: EDP, CHIEF COMPLAINT: Postcardiac arrest  History of Present Illness:  This is a 52 year old female with past medical history of COPD, Crohn's on Skyrizi, hypertension, type 2 diabetes mellitus who initially presented to the after being found unresponsive.  Per family, patient has had bloody emesis with diarrhea over the past few days.  Initial systolics in the 60s in the ED, and patient was minimally responsive.  In the emergency room, patient was intubated, and patient was admitted to the ICU for further evaluation and management for shock and altered mental status.  Pertinent  Medical History  Anxiety, asthma, chronic low back pain, Crohn's colitis, GERD type 2 diabetes mellitus, history of seizure disorder  Significant Hospital Events: Including procedures, antibiotic start and stop dates in addition to other pertinent events   01/27/2023: Admitted to ICU and intubated  Interim History / Subjective:  Overnight events:CVP was checked with persistent tachycardia.   Patient evaluated bedside this morning.  Patient still intubated and sedated.  Objective   Blood pressure (!) 138/53, pulse (!) 116, temperature 97.9 F (36.6 C), temperature source Bladder, resp. rate 13, height 5\' 3"  (1.6 m), weight 68.9 kg, SpO2 100%. CVP:  [6 mmHg-12 mmHg] 6 mmHg   Vent Mode: PSV;CPAP FiO2 (%):  [40 %-50 %] 40 % Set Rate:  [15 bmp] 15 bmp Vt Set:  [420 mL] 420 mL PEEP:  [5 cmH20] 5 cmH20 Pressure Support:  [5 cmH20] 5 cmH20 Plateau Pressure:  [13 cmH20-16 cmH20] 13 cmH20   Intake/Output Summary (Last 24 hours) at 01/28/2023 1353 Last data filed at 01/28/2023 0800 Gross per 24 hour  Intake 4841.1 ml  Output 2180 ml  Net 2661.1 ml   Filed Weights   01/27/23 0534 01/27/23 1102  Weight: 63.5 kg 68.9 kg    Examination: General: Critically ill, intubated,  sedated HENT: Normocephalic, atraumatic Lungs: Coarse breath sounds bilaterally Cardiovascular: Tachycardic, regular rhythm, no murmurs, rubs, or gallop Abdomen: Soft, nontender Extremities: Not able to freely move extremities Neuro: Intubated and sedated GU: Foley is draining well   Resolved Hospital Problem list     Assessment & Plan:  This is a 52 year old female with a past medical history of COPD, hypertension, type 2 diabetes, Crohn's colitis on Skyrizi who presents with bloody emesis.  Patient was found to be altered.  Patient was subsequently intubated and admitted for further evaluation management.  #Acute septic encephalopathy #Acute hypoxemic respiratory failure #Cardiac arrest Patient evaluated bedside this morning.  Patient is intubated and sedated.  No signs of bleeding..  Will continue with treating underlying etiology, do think this will likely improve.  Will continue with volume resuscitation.  Currently sedated with fentanyl.  Also using propofol.  Given gentle fluids with LR.  Patient is currently mechanically ventilated. -Continue supportive care -continue antibiotics with ceftriaxone and Flagyl -Continue mechanical ventilation  #Septic shock versus hypovolemic shock #Concern for GI bleed versus proctocolitis Likely related to primary concern with encephalopathy.  Patient is currently on ceftriaxone and metronidazole.  MRSA nares was negative.  Will need to have cultures speciate more.  Will try to wean off pressors as patient is currently on Levophed as well as vasopressin. GI is following.  Stating that they may want to scope patient later in the hospitalization, but right now need to treat underlying shock.  Reassured that patient's hemoglobin is stable. -Trend CBC, continue ceftriaxone and Flagyl  day 2 -Blood cultures likely with contamination -Continue to follow cultures -GI panel pending, C. difficile panel pending -GI following  #Prerenal  AKI #Hyponatremia AKI is improving with creatinine down to 1.28 this morning.  Will continue with fluid resuscitation, likely all this is prerenal due to volume loss. -Continue monitor BMP  #Type 2 diabetes mellitus.  A1c was 5.8 which shows this is pretty well-controlled.  Currently not receiving any insulin.  #History of seizures -Continue home Keppra  #Hypertension Given concerns for hypotension we will hold off on home antihypertensives. -Hold home metoprolol to tartrate 25 mg twice daily  #COPD No concern for exacerbation at this time.  Will continue with as needed albuterol.  Best Practice (right click and "Reselect all SmartList Selections" daily)   Diet/type: NPO DVT prophylaxis: SCD GI prophylaxis: PPI Lines: N/A Foley:  Yes, and it is still needed Code Status:  full code  Labs   CBC: Recent Labs  Lab 01/27/23 0448 01/27/23 0449 01/27/23 1140 01/27/23 1143 01/28/23 1156  WBC 17.2*  --   --  11.3* 13.9*  NEUTROABS 11.9*  --   --   --   --   HGB 13.4 13.9 13.9 14.0 13.5  HCT 40.8 41.0 41.0 40.9 40.5  MCV 85.7  --   --  81.5 84.7  PLT 435*  --   --  312 235    Basic Metabolic Panel: Recent Labs  Lab 01/27/23 0448 01/27/23 0449 01/27/23 1140 01/27/23 1143 01/28/23 0403  NA 130* 128* 129* 130* 131*  K 3.7 3.8 3.3* 3.6 3.6  CL 90* 94*  --  100 100  CO2 11*  --   --  17* 20*  GLUCOSE 173* 168*  --  139* 141*  BUN 56* 51*  --  45* 29*  CREATININE 3.37* 3.40*  --  1.87* 1.28*  CALCIUM 7.7*  --   --  6.9* 7.0*  MG  --   --   --   --  2.3  PHOS  --   --   --   --  4.8*   GFR: Estimated Creatinine Clearance: 47.3 mL/min (A) (by C-G formula based on SCr of 1.28 mg/dL (H)). Recent Labs  Lab 01/27/23 0448 01/27/23 0615 01/27/23 0819 01/27/23 1143 01/28/23 1156  WBC 17.2*  --   --  11.3* 13.9*  LATICACIDVEN  --  8.1* 5.2*  --   --     Liver Function Tests: Recent Labs  Lab 01/27/23 0448  AST 37  ALT 16  ALKPHOS 217*  BILITOT 0.7  PROT 5.2*   ALBUMIN 2.4*   No results for input(s): "LIPASE", "AMYLASE" in the last 168 hours. Recent Labs  Lab 01/27/23 0625  AMMONIA 57*    ABG    Component Value Date/Time   PHART 7.360 01/27/2023 1140   PCO2ART 33.9 01/27/2023 1140   PO2ART 196 (H) 01/27/2023 1140   HCO3 19.1 (L) 01/27/2023 1140   TCO2 20 (L) 01/27/2023 1140   ACIDBASEDEF 5.0 (H) 01/27/2023 1140   O2SAT 100 01/27/2023 1140     Coagulation Profile: Recent Labs  Lab 01/27/23 0615  INR 1.5*    Cardiac Enzymes: Recent Labs  Lab 01/27/23 0821  CKTOTAL 2,899*    HbA1C: Hgb A1c MFr Bld  Date/Time Value Ref Range Status  01/27/2023 11:43 AM 5.8 (H) 4.8 - 5.6 % Final    Comment:    (NOTE) Pre diabetes:          5.7%-6.4%  Diabetes:              >  6.4%  Glycemic control for   <7.0% adults with diabetes   12/23/2021 03:29 PM 4.7 (L) 4.8 - 5.6 % Final    Comment:    (NOTE) Pre diabetes:          5.7%-6.4%  Diabetes:              >6.4%  Glycemic control for   <7.0% adults with diabetes     CBG: Recent Labs  Lab 01/27/23 1906 01/28/23 0036 01/28/23 0258 01/28/23 0716 01/28/23 1152  GLUCAP 109* 96 121* 128* 138*    Review of Systems:   Negative except for what is stated in HPI  Past Medical History:  She,  has a past medical history of Anxiety, Asthma, Chronic low back pain, Collagen vascular disease (HCC), COPD (chronic obstructive pulmonary disease) (HCC), Crohn's disease (HCC), GERD (gastroesophageal reflux disease), History of head injury, Hypertension, IBS (irritable bowel syndrome), PSVT (paroxysmal supraventricular tachycardia), PTSD (post-traumatic stress disorder), Recurrent chest pain, Seizure disorder (HCC), and Type 2 diabetes mellitus (HCC).   Surgical History:   Past Surgical History:  Procedure Laterality Date   ABDOMINAL HYSTERECTOMY     with right salpingo oophorectomy 2005   APPENDECTOMY  2006   BALLOON DILATION N/A 02/19/2021   Procedure: BALLOON DILATION;  Surgeon:  Lanelle Bal, DO;  Location: [facility];  Service: Endoscopy;  Laterality: N/A;  Sigmoid colon stricture   BIOPSY  02/06/2021   Procedure: BIOPSY;  Surgeon: Lanelle Bal, DO;  Location: [facility];  Service: Endoscopy;;   BIOPSY  02/19/2021   Procedure: BIOPSY;  Surgeon: Lanelle Bal, DO;  Location: [facility];  Service: Endoscopy;;   BIOPSY  07/15/2021   Procedure: BIOPSY;  Surgeon: Malissa Hippo, MD;  Location: [facility];  Service: Endoscopy;;   CESAREAN SECTION     1996   CHOLECYSTECTOMY     COLONOSCOPY  2010   Dr. Jena Gauss; negative except for hemorrhoids   ESOPHAGEAL DILATION N/A 10/25/2014   Procedure: ESOPHAGEAL DILATION;  Surgeon: Malissa Hippo, MD;  Location: [facility];  Service: Endoscopy;  Laterality: N/A;   ESOPHAGOGASTRODUODENOSCOPY N/A 10/25/2014   Procedure: ESOPHAGOGASTRODUODENOSCOPY (EGD);  Surgeon: Malissa Hippo, MD;  Location: [facility];  Service: Endoscopy;  Laterality: N/A;  1250   FLEXIBLE SIGMOIDOSCOPY N/A 02/06/2021   Procedure: FLEXIBLE SIGMOIDOSCOPY;  Surgeon: Lanelle Bal, DO;  [facility]: [facility];  Service: Endoscopy;  Laterality: N/A;   FLEXIBLE SIGMOIDOSCOPY N/A 02/19/2021   Procedure: FLEXIBLE SIGMOIDOSCOPY;  Surgeon: Lanelle Bal, DO;  [facility]: [facility];  Service: Endoscopy;  Laterality: N/A;   OOPHORECTOMY     left for torsion and ovarian fibroma; uterine myoma resected 1995   SIGMOIDOSCOPY  07/15/2021   Procedure: SIGMOIDOSCOPY;  Surgeon: Malissa Hippo, MD;  Location: [facility];  Service: Endoscopy;;     Social History:   reports that she has been smoking cigarettes. She started smoking about 38 years ago. She has a 19.2 pack-year smoking history. She has been exposed to tobacco smoke. She has never used smokeless tobacco. She reports current drug use. Drug: Marijuana. She reports that she does not drink alcohol.   Family History:  Her family history includes Cancer in her mother; Cancer (age  of onset: 44) in her father; Crohn's disease in her cousin; Diabetes in her father; Heart attack in her mother; Heart attack (age of onset: 71) in her father; Heart failure in her father and mother; Hypertension in  her mother.   Allergies Allergies  Allergen Reactions   Codeine Shortness Of Breath, Swelling and Rash    Throat swelling   Dilaudid [Hydromorphone Hcl] Shortness Of Breath and Swelling   Hydrocodone Shortness Of Breath, Swelling and Rash   Ketorolac Nausea And Vomiting and Swelling    Pt reports n/v and swelling to throat   Morphine And Codeine Shortness Of Breath, Swelling and Rash   Oxycodone Shortness Of Breath, Swelling and Rash    Patient states ALL pain medications make her deathly sick.   Penicillins Shortness Of Breath, Swelling and Other (See Comments)    Throat swells 02/06/21--TOLERATES CEFTRIAXONE     Nickel Rash   Pregabalin Other (See Comments)   Acetaminophen     Per MD patient states she can't take Tylenol because of liver enzymes   Ativan [Lorazepam] Other (See Comments)    Migraines.   Strawberry Extract Swelling   Watermelon Concentrate Swelling   Albuterol Palpitations   Benadryl [Diphenhydramine Hcl] Palpitations   Humira [Adalimumab] Rash   Latex Rash   Remicade [Infliximab] Rash    Blisters and Welts    Tramadol Rash    Rash and itching     Home Medications  Prior to Admission medications   Medication Sig Start Date End Date Taking? Authorizing Provider  acetaminophen (TYLENOL) 500 MG tablet Take by mouth. 11/27/21  Yes [provider]  ALPRAZolam (XANAX) 1 MG tablet Take 1 mg by mouth 3 (three) times daily as needed for anxiety. 07/11/19  Yes [provider]  amitriptyline (ELAVIL) 100 MG tablet Take 100 mg by mouth at bedtime.   Yes [provider]  budesonide (ENTOCORT EC) 3 MG 24 hr capsule Take 9 mg by mouth daily.   Yes [provider]  cyclobenzaprine (FLEXERIL) 10 MG tablet Take 10 mg by mouth 3  (three) times daily as needed.   Yes [provider]  hydrOXYzine (ATARAX) 25 MG tablet Take 25 mg by mouth 3 (three) times daily as needed for anxiety. 12/13/21  Yes [provider]  hydrOXYzine (ATARAX) 50 MG tablet Take 25-50 mg by mouth 3 (three) times daily as needed.   Yes [provider]  levETIRAcetam (KEPPRA) 750 MG tablet Take 750 mg by mouth 2 (two) times daily. 09/30/20  Yes [provider]  ondansetron (ZOFRAN-ODT) 4 MG disintegrating tablet Take 4 mg by mouth every 8 (eight) hours as needed for nausea or vomiting. 09/07/21  Yes [provider]  pantoprazole (PROTONIX) 40 MG tablet Take 1 tablet (40 mg total) by mouth 2 (two) times daily. 01/03/22  Yes Vassie Loll, MD  acetaminophen (TYLENOL) 325 MG tablet Take 2 tablets (650 mg total) by mouth every 6 (six) hours as needed for mild pain or headache (or Fever >/= 101). 01/03/22   Vassie Loll, MD  cetirizine (ZYRTEC) 10 MG tablet Take 10 mg by mouth daily. 01/08/22   [provider]  clotrimazole-betamethasone (LOTRISONE) cream SMARTSIG:Sparingly Topical Twice Daily PRN    [provider]  DULoxetine (CYMBALTA) 30 MG capsule Take 30 mg by mouth daily. 12/17/21   [provider]  feeding supplement (ENSURE ENLIVE / ENSURE PLUS) LIQD Take 237 mLs by mouth 2 (two) times daily between meals. 01/04/22   Vassie Loll, MD  folic acid (FOLVITE) 1 MG tablet Take 1 tablet (1 mg total) by mouth daily. 10/03/21   Catarina Hartshorn, MD  gabapentin (NEURONTIN) 300 MG capsule TAKE (1) CAPSULE BY MOUTH TWICE DAILY. 10/15/22  [provider]  metoprolol succinate (TOPROL XL) 25 MG 24 hr tablet Take 1 tablet (25 mg total) by mouth daily. 04/23/22   Jonelle Sidle, MD  metoprolol tartrate (LOPRESSOR) 25 MG tablet Take 25 mg by mouth 2 (two) times daily.    [provider]  Multiple Vitamins-Minerals (SUPER THERA VITE M) TABS Take 1 tablet by mouth daily. 12/13/21   [provider]  nitroGLYCERIN (NITROSTAT) 0.4 MG SL tablet Place 1 tablet (0.4 mg total) under the tongue every 5 (five) minutes as needed for chest pain. 03/19/21   Malissa Hippo, MD  omeprazole (PRILOSEC) 40 MG capsule Take 40 mg by mouth 2 (two) times daily.    [provider]  polyethylene glycol powder (GLYCOLAX/MIRALAX) 17 GM/SCOOP powder Take by mouth. 11/28/21   [provider]  RESTASIS 0.05 % ophthalmic emulsion Place 1 drop into both eyes 2 (two) times daily. 12/12/20   [provider]  Risankizumab-rzaa (SKYRIZI) 360 MG/2.4ML SOCT Inject 2.4 mLs into the skin See admin instructions. Every 8 weeks 07/20/22   [provider]  SPIRIVA RESPIMAT 2.5 MCG/ACT AERS Inhale 1 puff into the lungs daily. 01/03/22   Vassie Loll, MD  triazolam (HALCION) 0.25 MG tablet Take 0.25 mg by mouth daily.    [provider]  Vitamin D, Ergocalciferol, (DRISDOL) 1.25 MG (50000 UNIT) CAPS capsule Take 50,000 Units by mouth once a week. Takes on Wednesdays. 12/14/21   [provider]     Critical care time: 40 mins    Modena Slater, DO Internal Medicine Resident PGY-2 Pager: [phone]

## 2023-01-28 NOTE — Progress Notes (Signed)
eLink Physician-Brief Progress Note Patient Name: [name] DOB: 13-Sep-1970 MRN: [id number]   Date of Service  01/28/2023  HPI/Events of Note  Patient with persistent sinus tachycardia, BNP 554, patient remains oliguric.  eICU Interventions  Recheck of CVP ordered.        Thomasene Lot Joselyne Spake 01/28/2023, 3:24 AM

## 2023-01-28 NOTE — Progress Notes (Signed)
[facility] ADULT ICU REPLACEMENT PROTOCOL   The patient does apply for the [facility] Adult ICU Electrolyte Replacment Protocol based on the criteria listed below:   1.Exclusion criteria: TCTS, ECMO, Dialysis, and Myasthenia Gravis patients 2. Is GFR >/= 30 ml/min? Yes.    Patient's GFR today is 50 3. Is SCr </= 2? Yes.   Patient's SCr is 1.28 mg/dL 4. Did SCr increase >/= 0.5 in 24 hours? No. 5.Pt's weight >40kg  Yes.   6. Abnormal electrolyte(s): Potassium  7. Electrolytes replaced per protocol 8.  Call MD STAT for K+ </= 2.5, Phos </= 1, or Mag </= 1 Physician:  Dr. Namon Cirri A Saryah Loper 01/28/2023 5:52 AM

## 2023-01-28 NOTE — Progress Notes (Signed)
PHARMACY - PHYSICIAN COMMUNICATION CRITICAL VALUE ALERT - BLOOD CULTURE IDENTIFICATION (BCID)  [name] is an 52 y.o. female who presented to [facility] on 01/27/2023 with a chief complaint of cardiac arrest 2/2 GI bleeding. Call from micro lab stating that patient has staph species in 1/3 bottles  Name of physician (or Provider) Contacted: Dr. Allena Katz  Current antibiotics: ceftriaxone and metronidazole  Changes to prescribed antibiotics recommended: patient is on appropriate antibiotics, CCM wanted to continue with anaerobic coverage in setting of potential intra-abdominal source  Results for orders placed or performed during the hospital encounter of 01/27/23  Blood Culture ID Panel (Reflexed) (Collected: 01/27/2023  6:15 AM)  Result Value Ref Range   Enterococcus faecalis NOT DETECTED NOT DETECTED   Enterococcus Faecium NOT DETECTED NOT DETECTED   Listeria monocytogenes NOT DETECTED NOT DETECTED   Staphylococcus species DETECTED (A) NOT DETECTED   Staphylococcus aureus (BCID) NOT DETECTED NOT DETECTED   Staphylococcus epidermidis NOT DETECTED NOT DETECTED   Staphylococcus lugdunensis NOT DETECTED NOT DETECTED   Streptococcus species NOT DETECTED NOT DETECTED   Streptococcus agalactiae NOT DETECTED NOT DETECTED   Streptococcus pneumoniae NOT DETECTED NOT DETECTED   Streptococcus pyogenes NOT DETECTED NOT DETECTED   A.calcoaceticus-baumannii NOT DETECTED NOT DETECTED   Bacteroides fragilis NOT DETECTED NOT DETECTED   Enterobacterales NOT DETECTED NOT DETECTED   Enterobacter cloacae complex NOT DETECTED NOT DETECTED   Escherichia coli NOT DETECTED NOT DETECTED   Klebsiella aerogenes NOT DETECTED NOT DETECTED   Klebsiella oxytoca NOT DETECTED NOT DETECTED   Klebsiella pneumoniae NOT DETECTED NOT DETECTED   Proteus species NOT DETECTED NOT DETECTED   Salmonella species NOT DETECTED NOT DETECTED   Serratia marcescens NOT DETECTED NOT DETECTED   Haemophilus influenzae NOT DETECTED  NOT DETECTED   Neisseria meningitidis NOT DETECTED NOT DETECTED   Pseudomonas aeruginosa NOT DETECTED NOT DETECTED   Stenotrophomonas maltophilia NOT DETECTED NOT DETECTED   Candida albicans NOT DETECTED NOT DETECTED   Candida auris NOT DETECTED NOT DETECTED   Candida glabrata NOT DETECTED NOT DETECTED   Candida krusei NOT DETECTED NOT DETECTED   Candida parapsilosis NOT DETECTED NOT DETECTED   Candida tropicalis NOT DETECTED NOT DETECTED   Cryptococcus neoformans/gattii NOT DETECTED NOT DETECTED    [name] 01/28/2023  11:35 AM

## 2023-01-28 NOTE — Plan of Care (Signed)
  Problem: Elimination: Goal: Will not experience complications related to urinary retention Outcome: Progressing   Problem: Pain Managment: Goal: General experience of comfort will improve Outcome: Progressing   Problem: Safety: Goal: Ability to remain free from injury will improve Outcome: Progressing   Problem: Skin Integrity: Goal: Risk for impaired skin integrity will decrease Outcome: Progressing   Problem: Safety: Goal: Non-violent Restraint(s) Outcome: Progressing

## 2023-01-28 NOTE — Progress Notes (Signed)
Initial Nutrition Assessment  DOCUMENTATION CODES:   Not applicable  INTERVENTION:  When ready to initiate TF, recommend the following via OGT: Osmolite 1.2 at 60 ml/h (1440 ml per day) Start at 20 and advance by 10mL q8h to goal of 60 Prosource TF20 60 ml 1x/d Provides 1808 kcal, 100 gm protein, 1181 ml free water daily As there is no intake hx available at this time, would recommend monitoring for refeeding syndrome when initiating nutrition by monitoring electrolytes and adding thiamine 100 mg x 5 days  NUTRITION DIAGNOSIS:   Inadequate oral intake related to inability to eat as evidenced by NPO status.  GOAL:   Patient will meet greater than or equal to 90% of their needs  MONITOR:  Vent status, Labs, Weight trends, I & O's  REASON FOR ASSESSMENT:  Ventilator    ASSESSMENT:  Pt with hx of GERD, IBS, crohn's, COPD, HTN, PTSD, and DM type 2 presented to [facility] ED after being found down at home with bloody emesis nearby. Intubated and transferred to [facility].  Patient is currently intubated on ventilator support. No family present at bedside at the time of assessment. OGT in place to LIWS with dark output.   GI following as there was concern for possible GI bleed, will possibly undergo EGD later this admission. Discussed with MD, no plans to feed today. Distended abdomen with hypoactive bowel sounds. Pressor support x 2 with stable rates. Will prepare TF orders for use if needed this weekend.   MV: 8.2 L/min Temp (24hrs), Avg:100.5 F (38.1 C), Min:97 F (36.1 C), Max:101.8 F (38.8 C)   Intake/Output Summary (Last 24 hours) at 01/28/2023 1245 Last data filed at 01/28/2023 0800 Gross per 24 hour  Intake 5571.61 ml  Output 2180 ml  Net 3391.61 ml  Net IO Since Admission: 6,506.61 mL [01/28/23 1245]  Admit weight: 63.5 kg Current weight: 68.9 kg   Nutritionally Relevant Medications: Scheduled Meds:  insulin aspart  0-9 Units Subcutaneous Q4H   pantoprazole  IV  40 mg Intravenous Q12H   potassium chloride  40 mEq Per Tube Once   Continuous Infusions:  cefTRIAXone (ROCEPHIN)  IV Stopped (01/27/23 1453)   lactated ringers 75 mL/hr at 01/28/23 0800   metronidazole Stopped (01/28/23 0338)   norepinephrine (LEVOPHED) Adult infusion 3 mcg/min (01/28/23 0800)   propofol (DIPRIVAN) infusion Stopped (01/28/23 0237)   vasopressin 0.03 Units/min (01/28/23 0800)   Labs Reviewed: Na 131 BUN 29, creatinine 1.28 Phosphorus 4.8 CBG ranges from 96-128 mg/dL over the last 24 hours HgbA1c 5.8%  NUTRITION - FOCUSED PHYSICAL EXAM: Flowsheet Row Most Recent Value  Orbital Region No depletion  Upper Arm Region No depletion  Thoracic and Lumbar Region No depletion  Buccal Region No depletion  Temple Region Mild depletion  Clavicle Bone Region No depletion  Clavicle and Acromion Bone Region No depletion  Scapular Bone Region Unable to assess  Dorsal Hand No depletion  Patellar Region Mild depletion  Anterior Thigh Region Mild depletion  Posterior Calf Region Moderate depletion  Edema (RD Assessment) Mild  [forearms]  Hair Reviewed  Eyes Reviewed  Mouth Reviewed  Skin Reviewed  Nails Reviewed    Diet Order:   Diet Order             Diet NPO time specified  Diet effective now                   EDUCATION NEEDS:  Not appropriate for education at this time  Skin:  Skin Assessment: Reviewed RN Assessment  Last BM:  PTA  Height:  Ht Readings from Last 1 Encounters:  01/27/23 5\' 3"  (1.6 m)    Weight:  Wt Readings from Last 1 Encounters:  01/27/23 68.9 kg    Ideal Body Weight:  52.3 kg  BMI:  Body mass index is 26.91 kg/m.  Estimated Nutritional Needs:  Kcal:  1700-1900 kcal/d Protein:  85-100 g/d Fluid:  >1.8L/d    Greig Castilla, RD, LDN Clinical Dietitian RD pager # available in AMION  After hours/weekend pager # available in [facility]

## 2023-01-29 DIAGNOSIS — I469 Cardiac arrest, cause unspecified: Secondary | ICD-10-CM | POA: Diagnosis not present

## 2023-01-29 LAB — COMPREHENSIVE METABOLIC PANEL
ALT: 50 U/L — ABNORMAL HIGH (ref 0–44)
AST: 128 U/L — ABNORMAL HIGH (ref 15–41)
Albumin: 1.6 g/dL — ABNORMAL LOW (ref 3.5–5.0)
Alkaline Phosphatase: 158 U/L — ABNORMAL HIGH (ref 38–126)
Anion gap: 11 (ref 5–15)
BUN: 23 mg/dL — ABNORMAL HIGH (ref 6–20)
CO2: 22 mmol/L (ref 22–32)
Calcium: 7.7 mg/dL — ABNORMAL LOW (ref 8.9–10.3)
Chloride: 102 mmol/L (ref 98–111)
Creatinine, Ser: 1 mg/dL (ref 0.44–1.00)
GFR, Estimated: >60 mL/min (ref >60)
Glucose, Bld: 122 mg/dL — ABNORMAL HIGH (ref 70–99)
Potassium: 3.6 mmol/L (ref 3.5–5.1)
Sodium: 135 mmol/L (ref 135–145)
Total Bilirubin: 0.6 mg/dL (ref 0.3–1.2)
Total Protein: 4.5 g/dL — ABNORMAL LOW (ref 6.5–8.1)

## 2023-01-29 LAB — MAGNESIUM: Magnesium: 2.5 mg/dL — ABNORMAL HIGH (ref 1.7–2.4)

## 2023-01-29 LAB — CBC
HCT: 39.9 % (ref 36.0–46.0)
Hemoglobin: 13.3 g/dL (ref 12.0–15.0)
MCH: 28.4 pg (ref 26.0–34.0)
MCHC: 33.3 g/dL (ref 30.0–36.0)
MCV: 85.3 fL (ref 80.0–100.0)
Platelets: 185 10*3/uL (ref 150–400)
RBC: 4.68 MIL/uL (ref 3.87–5.11)
RDW: 15.3 % (ref 11.5–15.5)
WBC: 12.6 10*3/uL — ABNORMAL HIGH (ref 4.0–10.5)
nRBC: 0.4 % — ABNORMAL HIGH (ref 0.0–0.2)

## 2023-01-29 LAB — GLUCOSE, CAPILLARY
Glucose-Capillary: 103 mg/dL — ABNORMAL HIGH (ref 70–99)
Glucose-Capillary: 106 mg/dL — ABNORMAL HIGH (ref 70–99)
Glucose-Capillary: 107 mg/dL — ABNORMAL HIGH (ref 70–99)
Glucose-Capillary: 95 mg/dL (ref 70–99)
Glucose-Capillary: 105 mg/dL — ABNORMAL HIGH (ref 70–99)
Glucose-Capillary: 104 mg/dL — ABNORMAL HIGH (ref 70–99)

## 2023-01-29 LAB — LACTIC ACID, PLASMA: Lactic Acid, Venous: 1 mmol/L (ref 0.5–1.9)

## 2023-01-29 LAB — C-REACTIVE PROTEIN: CRP: 53.6 mg/dL — ABNORMAL HIGH (ref <1.0)

## 2023-01-29 NOTE — Plan of Care (Signed)
  Problem: Pain Managment: Goal: General experience of comfort will improve Outcome: Progressing   Problem: Safety: Goal: Ability to remain free from injury will improve Outcome: Progressing   Problem: Skin Integrity: Goal: Risk for impaired skin integrity will decrease Outcome: Progressing   

## 2023-01-29 NOTE — Progress Notes (Signed)
Subjective: No acute events.  Nursing reports weaning off of Levophed.  She is still on vasopressin.  Objective: Vital signs in last 24 hours: Temp:  [96.3 F (35.7 C)-102.6 F (39.2 C)] 97.9 F (36.6 C) (09/28 1730) Pulse Rate:  [103-176] 119 (09/28 1730) Resp:  [8-19] 14 (09/28 1730) BP: (110-140)/(31-90) 126/46 (09/28 1730) SpO2:  [84 %-100 %] 98 % (09/28 1730) FiO2 (%):  [40 %-50 %] 50 % (09/28 1414) Last BM Date :  (PTA)  Intake/Output from previous day: 09/27 0701 - 09/28 0700 In: 2933 [I.V.:2367; IV Piggyback:566] Out: 1515 [Urine:1015; Emesis/NG output:500] Intake/Output this shift: Total I/O In: 1018.2 [I.V.:796.6; IV Piggyback:221.5] Out: -   General appearance: intubated, sedated Resp: CTA anteriorly Cardio: regular rate and rhythm GI: tender with mild palpation of the right side of the abdomen, no rigidity Extremities: extremities normal, atraumatic, no cyanosis or edema  Lab Results: Recent Labs    01/27/23 1143 01/28/23 1156 01/29/23 0453  WBC 11.3* 13.9* 12.6*  HGB 14.0 13.5 13.3  HCT 40.9 40.5 39.9  PLT 312 235 185   BMET Recent Labs    01/27/23 1143 01/28/23 0403 01/29/23 0453  NA 130* 131* 135  K 3.6 3.6 3.6  CL 100 100 102  CO2 17* 20* 22  GLUCOSE 139* 141* 122*  BUN 45* 29* 23*  CREATININE 1.87* 1.28* 1.00  CALCIUM 6.9* 7.0* 7.7*   LFT Recent Labs    01/29/23 0453  PROT 4.5*  ALBUMIN 1.6*  AST 128*  ALT 50*  ALKPHOS 158*  BILITOT 0.6   PT/INR Recent Labs    01/27/23 0615  LABPROT 18.3*  INR 1.5*   Hepatitis Panel No results for input(s): "HEPBSAG", "HCVAB", "HEPAIGM", "HEPBIGM" in the last 72 hours. C-Diff No results for input(s): "CDIFFTOX" in the last 72 hours. Fecal Lactopherrin No results for input(s): "FECLLACTOFRN" in the last 72 hours.  Studies/Results: CT ABDOMEN PELVIS WO CONTRAST  Result Date: 01/28/2023 CLINICAL DATA:  Sepsis EXAM: CT ABDOMEN AND PELVIS WITHOUT CONTRAST TECHNIQUE: Multidetector CT  imaging of the abdomen and pelvis was performed following the standard protocol without IV contrast. RADIATION DOSE REDUCTION: This exam was performed according to the departmental dose-optimization program which includes automated exposure control, adjustment of the mA and/or kV according to patient size and/or use of iterative reconstruction technique. COMPARISON:  None Available. FINDINGS: Lower chest: Bibasilar atelectasis. Small pericardial effusion. Cardiac size within normal limits. Hepatobiliary: No focal liver abnormality is seen. Status post cholecystectomy. No biliary dilatation. Pancreas: Unremarkable Spleen: Unremarkable Adrenals/Urinary Tract: The adrenal glands are unremarkable. The kidneys are normal on this noncontrast examination. Foley catheter balloon seen within a decompressed bladder lumen. Stomach/Bowel: Nasogastric tube tip seen within the proximal body of the stomach. There is moderate stool within the descending colon and rectum. There is circumferential bowel wall thickening and pericolonic inflammatory stranding involving the descending colon and rectosigmoid colon with extensive perirectal inflammatory stranding seen within the presacral space. The findings may reflect changes of an infectious or inflammatory proctocolitis including stercoral proctocolitis. More proximally, the small bowel is diffusely mildly dilated and fluid-filled as is the proximal colon without a focal point of transition identified suggestive of a a underlying ileus. No free intraperitoneal gas. Trace free fluid noted within the pelvis. Vascular/Lymphatic: No significant vascular findings are present. No enlarged abdominal or pelvic lymph nodes. Reproductive: Status post hysterectomy. No adnexal masses. Other: No abdominal wall hernia Musculoskeletal: No acute bone abnormality. No lytic or blastic bone lesion. Extensive infiltration noted within  the visualized right upper extremity in the region of a intravenous  catheter. IMPRESSION: 1. Moderate stool within the descending colon and rectum with circumferential bowel wall thickening and pericolonic inflammatory stranding involving the descending colon and rectosigmoid colon. The findings may reflect changes of an infectious or inflammatory proctocolitis including stercoral proctocolitis. 2. Superimposed adynamic ileus with diffusely mildly dilated fluid-filled small bowel. 3. Small pericardial effusion. 4. Bibasilar atelectasis. 5. Extensive infiltration within the visualized right upper extremity in the region of the indwelling IV catheter. Correlation with clinical examination is recommended. Electronically Signed   By: Helyn Numbers M.D.   On: 01/28/2023 00:52    Medications: Scheduled:  sodium chloride   Intravenous Once   Chlorhexidine Gluconate Cloth  6 each Topical Daily   insulin aspart  0-9 Units Subcutaneous Q4H   mouth rinse  15 mL Mouth Rinse Q2H   pantoprazole (PROTONIX) IV  40 mg Intravenous Q12H   polyethylene glycol  17 g Per Tube Daily   Continuous:  cefTRIAXone (ROCEPHIN)  IV Stopped (01/29/23 1533)   fentaNYL infusion INTRAVENOUS 50 mcg/hr (01/29/23 1600)   lactated ringers 75 mL/hr at 01/29/23 1727   levETIRAcetam Stopped (01/29/23 1035)   metronidazole 100 mL/hr at 01/29/23 1600   norepinephrine (LEVOPHED) Adult infusion Stopped (01/29/23 1610)   propofol (DIPRIVAN) infusion Stopped (01/28/23 0237)   vasopressin 0.02 Units/min (01/29/23 1600)    Assessment/Plan: 1) Crohn's disease. 2) Ileus. 3) Respiratory failure. 4) Shock.   There is some slow progress with weaning off of Levophed.  The NG aspirate is dark, but it appears to be bilious.  Her HGB is stable in the 13 g/dL range.  There was tenderness with mild palpation of the abdomen in this sedated patient on fentanyl.  The abdomen did not appear to be acute.  WBC remains stable.    Plan: 1) Continue with antibiotics. 2) If necessary, prednisone may be required, in  conjunction with antibiotics, for her treatment of Crohn's. 3) Repeat imaging may be required pending her clinical course and labs.  LOS: 2 days   Vonnetta Akey D 01/29/2023, 5:41 PM

## 2023-01-29 NOTE — Progress Notes (Signed)
NAMEAngellena [name], MRN:  [id number], DOB:  Mar 16, 1971, LOS: 2 ADMISSION DATE:  01/27/2023, CONSULTATION DATE: 01/27/2023 REFERRING MD: EDP, CHIEF COMPLAINT: Postcardiac arrest  History of Present Illness:  This is a 52 year old female with past medical history of COPD, Crohn's on Skyrizi, hypertension, type 2 diabetes mellitus who initially presented to the after being found unresponsive.  Per family, patient has had bloody emesis with diarrhea over the past few days.  Initial systolics in the 60s in the ED, and patient was minimally responsive.  In the emergency room, patient was intubated, and patient was admitted to the ICU for further evaluation and management for shock and altered mental status.  Pertinent  Medical History  Anxiety, asthma, chronic low back pain, Crohn's colitis, GERD type 2 diabetes mellitus, history of seizure disorder  Significant Hospital Events: Including procedures, antibiotic start and stop dates in addition to other pertinent events   01/27/2023: Admitted to ICU and intubated  Interim History / Subjective:   Remains intubated and on pressors.  Off Levophed but continues on vasopressin.  Objective   Blood pressure (!) 122/55, pulse (!) 104, temperature 98.6 F (37 C), resp. rate 15, height 5' 2.99" (1.6 m), weight 68.9 kg, SpO2 97%. CVP:  [6 mmHg] 6 mmHg   Vent Mode: PSV;CPAP FiO2 (%):  [40 %] 40 % PEEP:  [5 cmH20] 5 cmH20 Pressure Support:  [5 cmH20] 5 cmH20   Intake/Output Summary (Last 24 hours) at 01/29/2023 0721 Last data filed at 01/29/2023 0541 Gross per 24 hour  Intake 2097.48 ml  Output 1515 ml  Net 582.48 ml   Filed Weights   01/27/23 0534 01/27/23 1102  Weight: 63.5 kg 68.9 kg    Examination: Gen:      No acute distress HEENT:  EOMI, sclera anicteric Neck:     No masses; no thyromegaly, ETT Lungs:    Clear to auscultation bilaterally; normal respiratory effort CV:         Regular rate and rhythm; no murmurs Abd:      + bowel  sounds; soft, non-tender; no palpable masses, no distension Ext:    No edema; adequate peripheral perfusion Skin:      Warm and dry; no rash Neuro: Sedated  Lab/imaging reviewed Significant for BUN/creatinine 23/1.00 AST 128, ALT 50 WBC 12.8  Resolved Hospital Problem list     Assessment & Plan:  This is a 52 year old female with a past medical history of COPD, hypertension, type 2 diabetes, Crohn's colitis on Skyrizi who presents with bloody emesis.  Patient was found to be altered.  Patient was subsequently intubated and admitted for further evaluation management.  #Acute septic encephalopathy #Acute hypoxemic respiratory failure #Cardiac arrest -Continue supportive care -Continue mechanical ventilation  #Septic shock versus hypovolemic shock #Concern for GI bleed versus proctocolitis Wean off pressors as tolerated Recheck lactic acid Continue ceftriaxone, Flagyl GI panel and C. difficile test is pending Has 1 out of 4 bottles with GPC's which may be a contaminant.  Will await final cultures.  #Prerenal AKI #Hyponatremia Improving.  Monitor urine output and creatinine.  #Type 2 diabetes mellitus.  A1c was 5.8 which shows this is pretty well-controlled.  Currently not receiving any insulin.  #History of seizures -Continue home Keppra  #Hypertension Given concerns for hypotension we will hold off on home antihypertensives. -Hold home metoprolol to tartrate 25 mg twice daily  #COPD No concern for exacerbation at this time.  Will continue with as needed albuterol.  # Nutrition Has increased  NG residuals.  Holding off on tube feeds for now.  Best Practice (right click and "Reselect all SmartList Selections" daily)   Diet/type: NPO DVT prophylaxis: SCD GI prophylaxis: PPI Lines: Central line Foley:  Yes, and it is still needed Code Status:  full code  Critical care time:    The patient is critically ill with multiple organ system failure and requires high  complexity decision making for assessment and support, frequent evaluation and titration of therapies, advanced monitoring, review of radiographic studies and interpretation of complex data.   Critical Care Time devoted to patient care services, exclusive of separately billable procedures, described in this note is 35 minutes.   Chilton Greathouse MD [facility] See Amion for pager  If no response to pager , please call [phone] until 7pm After 7:00 pm call Elink  [phone] 01/29/2023, 7:28 AM

## 2023-01-30 ENCOUNTER — Inpatient Hospital Stay (HOSPITAL_COMMUNITY): Payer: Medicaid Other

## 2023-01-30 DIAGNOSIS — I469 Cardiac arrest, cause unspecified: Secondary | ICD-10-CM | POA: Diagnosis not present

## 2023-01-30 LAB — CBC
HCT: 36.5 % (ref 36.0–46.0)
Hemoglobin: 11.9 g/dL — ABNORMAL LOW (ref 12.0–15.0)
MCH: 27.9 pg (ref 26.0–34.0)
MCHC: 32.6 g/dL (ref 30.0–36.0)
MCV: 85.7 fL (ref 80.0–100.0)
Platelets: 146 10*3/uL — ABNORMAL LOW (ref 150–400)
RBC: 4.26 MIL/uL (ref 3.87–5.11)
RDW: 16.2 % — ABNORMAL HIGH (ref 11.5–15.5)
WBC: 13.3 10*3/uL — ABNORMAL HIGH (ref 4.0–10.5)
nRBC: 0 % (ref 0.0–0.2)

## 2023-01-30 LAB — GASTROINTESTINAL PANEL BY PCR, STOOL (REPLACES STOOL CULTURE)

## 2023-01-30 LAB — COMPREHENSIVE METABOLIC PANEL
ALT: 45 U/L — ABNORMAL HIGH (ref 0–44)
AST: 82 U/L — ABNORMAL HIGH (ref 15–41)
Albumin: <1.5 g/dL — ABNORMAL LOW (ref 3.5–5.0)
Alkaline Phosphatase: 152 U/L — ABNORMAL HIGH (ref 38–126)
Anion gap: 16 — ABNORMAL HIGH (ref 5–15)
BUN: 17 mg/dL (ref 6–20)
CO2: 19 mmol/L — ABNORMAL LOW (ref 22–32)
Calcium: 7.6 mg/dL — ABNORMAL LOW (ref 8.9–10.3)
Chloride: 103 mmol/L (ref 98–111)
Creatinine, Ser: 1.1 mg/dL — ABNORMAL HIGH (ref 0.44–1.00)
GFR, Estimated: >60 mL/min (ref >60)
Glucose, Bld: 118 mg/dL — ABNORMAL HIGH (ref 70–99)
Potassium: 3.3 mmol/L — ABNORMAL LOW (ref 3.5–5.1)
Sodium: 138 mmol/L (ref 135–145)
Total Bilirubin: 1.4 mg/dL — ABNORMAL HIGH (ref 0.3–1.2)
Total Protein: 4.2 g/dL — ABNORMAL LOW (ref 6.5–8.1)

## 2023-01-30 LAB — MAGNESIUM
Magnesium: 2.3 mg/dL (ref 1.7–2.4)
Magnesium: 2.4 mg/dL (ref 1.7–2.4)

## 2023-01-30 LAB — PHOSPHORUS
Phosphorus: 2.5 mg/dL (ref 2.5–4.6)
Phosphorus: 1.7 mg/dL — ABNORMAL LOW (ref 2.5–4.6)

## 2023-01-30 LAB — GLUCOSE, CAPILLARY
Glucose-Capillary: 116 mg/dL — ABNORMAL HIGH (ref 70–99)
Glucose-Capillary: 103 mg/dL — ABNORMAL HIGH (ref 70–99)
Glucose-Capillary: 118 mg/dL — ABNORMAL HIGH (ref 70–99)
Glucose-Capillary: 113 mg/dL — ABNORMAL HIGH (ref 70–99)
Glucose-Capillary: 122 mg/dL — ABNORMAL HIGH (ref 70–99)
Glucose-Capillary: 117 mg/dL — ABNORMAL HIGH (ref 70–99)

## 2023-01-30 LAB — C DIFFICILE QUICK SCREEN W PCR REFLEX
C Diff antigen: POSITIVE — AB
C Diff toxin: NEGATIVE

## 2023-01-30 LAB — CLOSTRIDIUM DIFFICILE BY PCR, REFLEXED: Toxigenic C. Difficile by PCR: POSITIVE — AB

## 2023-01-30 MED ORDER — VITAL AF 1.2 CAL PO LIQD
1000.0000 mL | ORAL | Status: DC
  Administered 2023-01-30: 1000 mL

## 2023-01-30 MED ORDER — POTASSIUM CHLORIDE 20 MEQ PO PACK
40.0000 meq | PACK | Freq: Two times a day (BID) | ORAL | Status: DC
  Administered 2023-01-30 – 2023-01-31 (×3): 40 meq
  Filled 2023-01-30 (×3): qty 2

## 2023-01-30 MED ORDER — HEPARIN SODIUM (PORCINE) 5000 UNIT/ML IJ SOLN
5000.0000 [IU] | Freq: Three times a day (TID) | INTRAMUSCULAR | Status: DC
  Administered 2023-01-30 – 2023-02-11 (×35): 5000 [IU] via SUBCUTANEOUS
  Filled 2023-01-30 (×35): qty 1

## 2023-01-30 MED ORDER — POTASSIUM CHLORIDE 10 MEQ/50ML IV SOLN
10.0000 meq | INTRAVENOUS | Status: AC
  Administered 2023-01-30 (×4): 10 meq via INTRAVENOUS
  Filled 2023-01-30 (×2): qty 50

## 2023-01-30 MED ORDER — PROSOURCE TF20 ENFIT COMPATIBL EN LIQD
60.0000 mL | Freq: Every day | ENTERAL | Status: DC
  Administered 2023-01-30 – 2023-02-09 (×9): 60 mL
  Filled 2023-01-30 (×9): qty 60

## 2023-01-30 MED ORDER — POTASSIUM CHLORIDE 20 MEQ PO PACK
20.0000 meq | PACK | ORAL | Status: AC
  Administered 2023-01-30 (×2): 20 meq
  Filled 2023-01-30 (×2): qty 1

## 2023-01-30 NOTE — Progress Notes (Signed)
Subjective: Weaned off pressors.  Objective: Vital signs in last 24 hours: Temp:  [97.7 F (36.5 C)-98.8 F (37.1 C)] 98.4 F (36.9 C) (09/29 1245) Pulse Rate:  [106-129] 122 (09/29 1230) Resp:  [11-19] 15 (09/29 1245) BP: (76-128)/(39-73) 109/69 (09/29 1245) SpO2:  [94 %-100 %] 96 % (09/29 1230) FiO2 (%):  [50 %] 50 % (09/29 1033) Last BM Date :  (PTA)  Intake/Output from previous day: 09/28 0701 - 09/29 0700 In: 2504.9 [I.V.:1980.8; IV Piggyback:524.1] Out: 1200 [Urine:950; Emesis/NG output:250] Intake/Output this shift: No intake/output data recorded.  General appearance: intubated and sedated Resp: clear to auscultation bilaterally Cardio: regular rate and rhythm GI: significantly tender in the RLQ Extremities: extremities normal, atraumatic, no cyanosis or edema  Lab Results: Recent Labs    01/28/23 1156 01/29/23 0453 01/30/23 0332  WBC 13.9* 12.6* 13.3*  HGB 13.5 13.3 11.9*  HCT 40.5 39.9 36.5  PLT 235 185 146*   BMET Recent Labs    01/28/23 0403 01/29/23 0453 01/30/23 0332  NA 131* 135 138  K 3.6 3.6 3.3*  CL 100 102 103  CO2 20* 22 19*  GLUCOSE 141* 122* 118*  BUN 29* 23* 17  CREATININE 1.28* 1.00 1.10*  CALCIUM 7.0* 7.7* 7.6*   LFT Recent Labs    01/30/23 0332  PROT 4.2*  ALBUMIN <1.5*  AST 82*  ALT 45*  ALKPHOS 152*  BILITOT 1.4*   PT/INR No results for input(s): "LABPROT", "INR" in the last 72 hours. Hepatitis Panel No results for input(s): "HEPBSAG", "HCVAB", "HEPAIGM", "HEPBIGM" in the last 72 hours. C-Diff Recent Labs    01/30/23 0434  CDIFFTOX NEGATIVE   Fecal Lactopherrin No results for input(s): "FECLLACTOFRN" in the last 72 hours.  Studies/Results: DG Chest Port 1 View  Result Date: 01/30/2023 CLINICAL DATA:  Acute respiratory failure. EXAM: PORTABLE CHEST 1 VIEW COMPARISON:  Radiographs 01/27/2023.  CT 06/18/2022. FINDINGS: 0643 hours. The endotracheal and enteric tubes are unchanged. Right IJ central venous catheter  is unchanged at the level of the superior cavoatrial junction. The heart size and mediastinal contours are stable. Stable mild bibasilar atelectasis. No confluent airspace disease, pleural effusion or pneumothorax. The bones appear unremarkable. Telemetry leads overlie the chest. IMPRESSION: Stable chest. Stable mild bibasilar atelectasis. Stable support system. Electronically Signed   By: Carey Bullocks M.D.   On: 01/30/2023 10:48    Medications: Scheduled:  sodium chloride   Intravenous Once   Chlorhexidine Gluconate Cloth  6 each Topical Daily   feeding supplement (PROSource TF20)  60 mL Per Tube Daily   feeding supplement (VITAL AF 1.2 CAL)  1,000 mL Per Tube Q24H   heparin injection (subcutaneous)  5,000 Units Subcutaneous Q8H   insulin aspart  0-9 Units Subcutaneous Q4H   mouth rinse  15 mL Mouth Rinse Q2H   pantoprazole (PROTONIX) IV  40 mg Intravenous Q12H   polyethylene glycol  17 g Per Tube Daily   potassium chloride  40 mEq Per Tube BID   Continuous:  cefTRIAXone (ROCEPHIN)  IV Stopped (01/29/23 1533)   fentaNYL infusion INTRAVENOUS 50 mcg/hr (01/30/23 7829)   lactated ringers 75 mL/hr at 01/30/23 0638   levETIRAcetam 750 mg (01/30/23 1100)   metronidazole Stopped (01/30/23 0345)   norepinephrine (LEVOPHED) Adult infusion Stopped (01/29/23 5621)   potassium chloride 10 mEq (01/30/23 1224)   propofol (DIPRIVAN) infusion Stopped (01/28/23 0237)   vasopressin Stopped (01/29/23 2032)    Assessment/Plan: 1) RLQ pain - ? This weekend. 2) Crohn's disease. 3) Respiratory  failure. 4) Mild leukocytosis.   The patient is improving in that she was able to be weaned off pressors.  She does have significant pain with palpation in the RLQ while being sedated on fentanyl.  She remains afebrile and her abdomen does not appear to be an acute abdomen.  I do not know if this RLQ pain was present at the time of the CT abdomen on 01/27/2023.  In light of her critical illness, it will be  reasonable to repeat a CT scan of the ABD/Pelvis with IV contrast.  Plan: 1) CT ABD/pelvis with IV contrast.  Discussed with Dr. Isaiah Serge. 2) Continue supportive care. 3) [facility] will resume care in the AM.  LOS: 3 days   Zi Sek D 01/30/2023, 12:55 PM

## 2023-01-30 NOTE — Progress Notes (Addendum)
NAMEJazlyn [name], MRN:  [id number], DOB:  1971/04/04, LOS: 3 ADMISSION DATE:  01/27/2023, CONSULTATION DATE: 01/27/2023 REFERRING MD: EDP, CHIEF COMPLAINT: Postcardiac arrest  History of Present Illness:   This is a 52 year old female with past medical history of COPD, Crohn's on Skyrizi, hypertension, type 2 diabetes mellitus who initially presented to the after being found unresponsive.  Per family, patient has had bloody emesis with diarrhea over the past few days.  Initial systolics in the 60s in the ED, and patient was minimally responsive.  In the emergency room, patient was intubated, and patient was admitted to the ICU for further evaluation and management for shock and altered mental status.  Pertinent  Medical History   Anxiety, asthma, chronic low back pain, Crohn's colitis, GERD type 2 diabetes mellitus, history of seizure disorder  Significant Hospital Events: Including procedures, antibiotic start and stop dates in addition to other pertinent events   01/27/2023: Admitted to ICU and intubated 9/29 Off pressors  Interim History / Subjective:   Remains intubated. Off pressors  Objective   Blood pressure 115/67, pulse (!) 120, temperature 98.1 F (36.7 C), resp. rate 15, height 5' 2.99" (1.6 m), weight 68.9 kg, SpO2 98%.     Vent Mode: PRVC FiO2 (%):  [50 %] 50 % Set Rate:  [15 bmp] 15 bmp Vt Set:  [420 mL] 420 mL PEEP:  [5 cmH20] 5 cmH20 Plateau Pressure:  [12 cmH20-18 cmH20] 18 cmH20   Intake/Output Summary (Last 24 hours) at 01/30/2023 0735 Last data filed at 01/30/2023 8413 Gross per 24 hour  Intake 2504.91 ml  Output 1200 ml  Net 1304.91 ml   Filed Weights   01/27/23 0534 01/27/23 1102  Weight: 63.5 kg 68.9 kg    Examination: Gen:      No acute distress HEENT:  EOMI, sclera anicteric, ETT Neck:     No masses; no thyromegaly Lungs:    Clear to auscultation bilaterally; normal respiratory effort CV:         Regular rate and rhythm; no murmurs Abd:       + bowel sounds; soft, non-tender; no palpable masses, no distension Ext:    No edema; adequate peripheral perfusion Skin:      Warm and dry; no rash Neuro: Sedated  Lab/imaging reviewed Significant for potassium 3.3, creatinine 1.10 AST 82, ALT 45, total bilirubin 1.4 WBC 13.3, platelets 146  Resolved Hospital Problem list     Assessment & Plan:  This is a 52 year old female with a past medical history of COPD, hypertension, type 2 diabetes, Crohn's colitis on Skyrizi who presents with bloody emesis.  Patient was found to be altered.  Patient was subsequently intubated and admitted for further evaluation management.  #Acute septic encephalopathy #Acute hypoxemic respiratory failure #Cardiac arrest -Continue supportive care -Continue mechanical ventilation  #Septic shock versus hypovolemic shock #Concern for GI bleed versus proctocolitis C. difficile antigen positive, toxin negative Continue ceftriaxone, Flagyl Has 1 out of 4 bottles with GPC's which may be a contaminant.  Will await final cultures.  #Prerenal AKI #Hyponatremia Improving.  Monitor urine output and creatinine.  #Type 2 diabetes mellitus.  A1c was 5.8 which shows this is pretty well-controlled.  Currently not receiving any insulin.  #History of seizures -Continue home Keppra Start duloxetine. Holding gabapentin  #Hypertension Given concerns for hypotension we will hold off on home antihypertensives. -Hold home metoprolol to tartrate 25 mg twice daily  #COPD No concern for exacerbation at this time.  Will continue with  as needed albuterol.  # Nutrition Start tube feeds  Best Practice (right click and "Reselect all SmartList Selections" daily)   Diet/type: tubefeeds DVT prophylaxis: SCD.  Start DVT prophylaxis.,Hep SQ GI prophylaxis: PPI Lines: Central line Foley:  Yes, and it is still needed Code Status:  full code.  Daughter updated 9/28  Critical care time:    The patient is critically  ill with multiple organ system failure and requires high complexity decision making for assessment and support, frequent evaluation and titration of therapies, advanced monitoring, review of radiographic studies and interpretation of complex data.   Critical Care Time devoted to patient care services, exclusive of separately billable procedures, described in this note is 35 minutes.   Chilton Greathouse MD [facility] See Amion for pager  If no response to pager , please call [phone] until 7pm After 7:00 pm call Elink  [phone] 01/30/2023, 7:35 AM

## 2023-01-30 NOTE — Progress Notes (Signed)
[facility] ADULT ICU REPLACEMENT PROTOCOL   The patient does apply for the [facility] Adult ICU Electrolyte Replacment Protocol based on the criteria listed below:   1.Exclusion criteria: TCTS, ECMO, Dialysis, and Myasthenia Gravis patients 2. Is GFR >/= 30 ml/min? Yes.    Patient's GFR today is >60 3. Is SCr </= 2? Yes.   Patient's SCr is 1.10 mg/dL 4. Did SCr increase >/= 0.5 in 24 hours? No. 5.Pt's weight >40kg  Yes.   6. Abnormal electrolyte(s): K+ = 3.3  7. Electrolytes replaced per protocol 8.  Call MD STAT for K+ </= 2.5, Phos </= 1, or Mag </= 1 Physician:  Cloyd Stagers, eMD  Faye Ramsay 01/30/2023 5:58 AM

## 2023-01-30 NOTE — Plan of Care (Signed)
  Problem: Education: Goal: Knowledge of General Education information will improve Description: Including pain rating scale, medication(s)/side effects and non-pharmacologic comfort measures Outcome: Progressing   Problem: Safety: Goal: Ability to remain free from injury will improve Outcome: Progressing   

## 2023-01-31 ENCOUNTER — Inpatient Hospital Stay (HOSPITAL_COMMUNITY): Payer: Medicaid Other

## 2023-01-31 DIAGNOSIS — R579 Shock, unspecified: Secondary | ICD-10-CM | POA: Diagnosis not present

## 2023-01-31 DIAGNOSIS — K50112 Crohn's disease of large intestine with intestinal obstruction: Secondary | ICD-10-CM

## 2023-01-31 DIAGNOSIS — J9601 Acute respiratory failure with hypoxia: Secondary | ICD-10-CM | POA: Diagnosis not present

## 2023-01-31 DIAGNOSIS — I469 Cardiac arrest, cause unspecified: Secondary | ICD-10-CM | POA: Diagnosis not present

## 2023-01-31 LAB — COMPREHENSIVE METABOLIC PANEL
ALT: 37 U/L (ref 0–44)
AST: 51 U/L — ABNORMAL HIGH (ref 15–41)
Albumin: <1.5 g/dL — ABNORMAL LOW (ref 3.5–5.0)
Alkaline Phosphatase: 166 U/L — ABNORMAL HIGH (ref 38–126)
Anion gap: 8 (ref 5–15)
BUN: 14 mg/dL (ref 6–20)
CO2: 25 mmol/L (ref 22–32)
Calcium: 8 mg/dL — ABNORMAL LOW (ref 8.9–10.3)
Chloride: 109 mmol/L (ref 98–111)
Creatinine, Ser: 1.03 mg/dL — ABNORMAL HIGH (ref 0.44–1.00)
GFR, Estimated: >60 mL/min (ref >60)
Glucose, Bld: 135 mg/dL — ABNORMAL HIGH (ref 70–99)
Potassium: 4.5 mmol/L (ref 3.5–5.1)
Sodium: 142 mmol/L (ref 135–145)
Total Bilirubin: 0.8 mg/dL (ref 0.3–1.2)
Total Protein: 4.4 g/dL — ABNORMAL LOW (ref 6.5–8.1)

## 2023-01-31 LAB — GLUCOSE, CAPILLARY
Glucose-Capillary: 131 mg/dL — ABNORMAL HIGH (ref 70–99)
Glucose-Capillary: 127 mg/dL — ABNORMAL HIGH (ref 70–99)
Glucose-Capillary: 134 mg/dL — ABNORMAL HIGH (ref 70–99)
Glucose-Capillary: 141 mg/dL — ABNORMAL HIGH (ref 70–99)
Glucose-Capillary: 141 mg/dL — ABNORMAL HIGH (ref 70–99)

## 2023-01-31 LAB — CBC
HCT: 34.4 % — ABNORMAL LOW (ref 36.0–46.0)
Hemoglobin: 11.3 g/dL — ABNORMAL LOW (ref 12.0–15.0)
MCH: 28.7 pg (ref 26.0–34.0)
MCHC: 32.8 g/dL (ref 30.0–36.0)
MCV: 87.3 fL (ref 80.0–100.0)
Platelets: 148 10*3/uL — ABNORMAL LOW (ref 150–400)
RBC: 3.94 MIL/uL (ref 3.87–5.11)
RDW: 16.4 % — ABNORMAL HIGH (ref 11.5–15.5)
WBC: 13.4 10*3/uL — ABNORMAL HIGH (ref 4.0–10.5)
nRBC: 0 % (ref 0.0–0.2)

## 2023-01-31 LAB — MAGNESIUM
Magnesium: 2.2 mg/dL (ref 1.7–2.4)
Magnesium: 2.1 mg/dL (ref 1.7–2.4)

## 2023-01-31 LAB — PHOSPHORUS
Phosphorus: <1 mg/dL — CL (ref 2.5–4.6)
Phosphorus: 2.4 mg/dL — ABNORMAL LOW (ref 2.5–4.6)

## 2023-01-31 MED ORDER — IOHEXOL 350 MG/ML SOLN
75.0000 mL | Freq: Once | INTRAVENOUS | Status: AC | PRN
  Administered 2023-01-31: 75 mL via INTRAVENOUS

## 2023-01-31 MED ORDER — ALPRAZOLAM 0.5 MG PO TABS
0.2500 mg | ORAL_TABLET | Freq: Three times a day (TID) | ORAL | Status: DC
  Administered 2023-01-31 – 2023-02-01 (×3): 0.25 mg
  Filled 2023-01-31 (×3): qty 1

## 2023-01-31 MED ORDER — OSMOLITE 1.2 CAL PO LIQD
1000.0000 mL | ORAL | Status: DC
  Administered 2023-01-31 – 2023-02-08 (×5): 1000 mL
  Filled 2023-01-31 (×13): qty 1000

## 2023-01-31 MED ORDER — SODIUM CHLORIDE 0.9 % IV SOLN: INTRAVENOUS | Status: DC | PRN

## 2023-01-31 MED ORDER — IOHEXOL 9 MG/ML PO SOLN
500.0000 mL | ORAL | Status: DC
  Administered 2023-01-31: 500 mL via ORAL

## 2023-01-31 MED ORDER — K PHOS MONO-SOD PHOS DI & MONO 155-852-130 MG PO TABS
500.0000 mg | ORAL_TABLET | Freq: Once | ORAL | Status: AC
  Administered 2023-01-31: 500 mg
  Filled 2023-01-31: qty 2

## 2023-01-31 MED ORDER — SODIUM PHOSPHATES 45 MMOLE/15ML IV SOLN
30.0000 mmol | Freq: Once | INTRAVENOUS | Status: AC
  Administered 2023-01-31: 30 mmol via INTRAVENOUS
  Filled 2023-01-31: qty 10

## 2023-01-31 MED ORDER — THIAMINE MONONITRATE 100 MG PO TABS
100.0000 mg | ORAL_TABLET | Freq: Every day | ORAL | Status: AC
  Administered 2023-01-31 – 2023-02-06 (×6): 100 mg
  Filled 2023-01-31 (×7): qty 1

## 2023-01-31 MED ORDER — LEVETIRACETAM 500 MG PO TABS
750.0000 mg | ORAL_TABLET | Freq: Two times a day (BID) | ORAL | Status: DC
  Administered 2023-01-31 – 2023-02-10 (×21): 750 mg
  Filled 2023-01-31 (×25): qty 1

## 2023-01-31 MED ORDER — IOHEXOL 9 MG/ML PO SOLN
500.0000 mL | ORAL | Status: AC
  Administered 2023-01-31: 500 mL

## 2023-01-31 NOTE — Progress Notes (Signed)
NAMEYarrow [name], MRN:  [id number], DOB:  July 05, 1970, LOS: 4 ADMISSION DATE:  01/27/2023, CONSULTATION DATE: 01/27/2023 REFERRING MD: EDP, CHIEF COMPLAINT: Postcardiac arrest  History of Present Illness:   This is a 52 year old female with past medical history of COPD, Crohn's on Skyrizi, hypertension, type 2 diabetes mellitus who initially presented to the after being found unresponsive.  Per family, patient has had bloody emesis with diarrhea over the past few days.  Initial systolics in the 60s in the ED, and patient was minimally responsive.  In the emergency room, patient was intubated, and patient was admitted to the ICU for further evaluation and management for shock and altered mental status.  Pertinent  Medical History   Anxiety, asthma, chronic low back pain, Crohn's colitis, GERD type 2 diabetes mellitus, history of seizure disorder  Significant Hospital Events: Including procedures, antibiotic start and stop dates in addition to other pertinent events   01/27/2023: Admitted to ICU and intubated 9/29 Off pressors  Interim History / Subjective:   Overnight events: Decreased phosphorus.  Repleted.  Patient evaluated bedside.  Able to awake to voice.  Not following commands.  Intubated and sedated.  Objective   Blood pressure 113/61, pulse (!) 111, temperature 98.8 F (37.1 C), temperature source Bladder, resp. rate 14, height 5' 2.99" (1.6 m), weight 75.6 kg, SpO2 99%.      Vent Mode: PRVC FiO2 (%):  [40 %-50 %] 40 % Set Rate:  [15 bmp] 15 bmp Vt Set:  [420 mL] 420 mL PEEP:  [5 cmH20] 5 cmH20 Pressure Support:  [5 cmH20] 5 cmH20 Plateau Pressure:  [14 cmH20] 14 cmH20   Intake/Output Summary (Last 24 hours) at 01/31/2023 1157 Last data filed at 01/31/2023 1143 Gross per 24 hour  Intake 3243.45 ml  Output 1137 ml  Net 2106.45 ml   Filed Weights   01/27/23 0534 01/27/23 1102 01/31/23 0500  Weight: 63.5 kg 68.9 kg 75.6 kg    Examination: General: Patient  intubated and sedated, critically ill Eyes: Pupils equal and reactive to light Head: Normocephalic, atraumatic  Cardio: Regular rate and rhythm, no murmurs, rubs or gallops. 2+ pulses to bilateral upper and lower extremities  Pulmonary: Coarse breath sounds bilaterally Abdomen: Soft, with tenderness to light touch Neuro: Intubated and sedated Skin: No rashes noted  MSK: 5/5 strength to upper and lower extremities.   Labs reviewed: Glucose ranging between 113-131 CBC: White count 13.4, hemoglobin 11.3, platelets 148 CMP: Glucose 135, bicarb 25, potassium 4.5, sodium 142, creatinine 1.03, alk phos 166, AST 51, ALT 37 bilirubin 0.8 Magnesium 2.2 Phosphorus less than 1.0 C. difficile antigen positive  Resolved Hospital Problem list     Assessment & Plan:  This is a 52 year old female with a past medical history of COPD, hypertension, type 2 diabetes, Crohn's colitis on Skyrizi who presents with bloody emesis.  Patient was found to be altered.  Patient was subsequently intubated and admitted for further evaluation management.  #Acute septic encephalopathy #Acute hypoxemic respiratory failure #Cardiac arrest Patient continues to remain intubated and sedated.  Will try to wean off ventilator today.  Will try to wean off sedation today and try SBT. -Wean off sedation -Continue mechanical ventilation, if patient can pass SBT, can remove ET tube  #Septic shock versus hypovolemic shock #Concern for GI bleed versus proctocolitis Will continue with ceftriaxone and Flagyl at this time.  Given toxin negative for C. difficile, will not treat.  Blood cultures likely contaminant.  Unclear etiology of shock at this  time, but that has resolved.  Patient has been weaned off all pressor therapy.  GI did want repeat CT, but has not been ordered.  Will order this today.   -Continue with Flagyl and Ceftriaxone day 4  -Follow up CT   #Prerenal AKI #Hyponatremia #Hypophosphatemia AKI resolved,  hyponatremia has resolved as well.  Decreased phosphorus today, will replete.  #Type 2 diabetes mellitus.  No acute concerns.  Glucose measuring well.  #History of seizures No concern for seizure-like activity.  Will continue Keppra.  Was to start duloxetine, but not given, but unable to be crushed. -Continue Keppra  #Anxiety Home medication includes Xanax 1 mg 3 times daily.  This was set to wake patient up, want to ensure patient does not have any acute withdrawal from benzo. -Will start low-dose benzo per tube.  #Hypertension Patient's maps are currently measuring well.  Holding home metoprolol tartrate 25 mg twice daily -Continue to monitor blood pressure closely -Hold home metoprolol  #COPD No concern for exacerbation at this time.  Will continue with as needed albuterol.  #Nutrition #Concern for refeeding syndrome Decreased phosphorus today.  Will continue to monitor.  Monitor magnesium as well. Continuing tube feeds.  Best Practice (right click and "Reselect all SmartList Selections" daily)   Diet/type: tubefeeds DVT prophylaxis: SCD.  Start DVT prophylaxis.,Hep SQ GI prophylaxis: PPI Lines: Central line Foley:  Yes, and it is still needed Code Status:  full code.  Daughter updated 9/28  Critical care time:    The patient is critically ill with multiple organ system failure and requires high complexity decision making for assessment and support, frequent evaluation and titration of therapies, advanced monitoring, review of radiographic studies and interpretation of complex data.   Critical Care Time devoted to patient care services, exclusive of separately billable procedures, described in this note is 35 minutes.   Modena Slater, DO  Internal Medicine Resident PGY-2 [phone]

## 2023-01-31 NOTE — Procedures (Signed)
Cortrak  Person Inserting Tube:  Mahala Menghini, RD Tube Type:  Cortrak - 55 inches Tube Size:  10 Tube Location:  Left nare Secured by: Bridle Technique Used to Measure Tube Placement:  Marking at nare/corner of mouth Cortrak Secured At:  73 cm   Cortrak Tube Team Note:  Consult received to place a Cortrak feeding tube.   X-ray is required, abdominal x-ray has been ordered by the Cortrak team. Please confirm tube placement before using the Cortrak tube.   If the tube becomes dislodged please keep the tube and contact the Cortrak team at [email or web address] for replacement.  If after hours and replacement cannot be delayed, place a NG tube and confirm placement with an abdominal x-ray.    Mertie Clause, MS, RD, LDN Registered Dietitian II Please see AMiON for contact information.

## 2023-01-31 NOTE — Progress Notes (Signed)
eLink Physician-Brief Progress Note Patient Name: [name] DOB: [birth year] MRN: [id number]   Date of Service  01/31/2023  HPI/Events of Note  Phos <1, Cr 1.03 K 4.5  eICU Interventions  Placed order for Na Phos.  Will continue to monitor serial phos        Melynda Krzywicki M DELA CRUZ 01/31/2023, 6:38 AM

## 2023-01-31 NOTE — Progress Notes (Signed)
Ct showing possible fluid collection, will see if IR is able to put a drain in.

## 2023-01-31 NOTE — Progress Notes (Signed)
Progress Note   Assessment    52 year old female with history of colonic Crohn's disease with rectal/rectosigmoid stricture on Skyrizi (dx 1998), history of recurrent C. difficile, COPD, diabetes, seizure disorder admitted with concern for sepsis of unclear etiology, acute kidney injury and septic encephalopathy requiring mechanical ventilation  C. difficile antigen positive, toxin negative Blood culture contaminant  Recommendations   1.  Colonic Crohn's disease with stricture --history of colonic Crohn's and rectosigmoid stricture.  Initial CT without contrast showed moderate stool in the descending colon and rectum with circumferential bowel wall thickening plus superimposed adynamic ileus.  Certainly could be flare of known Crohn's disease but unclear as to the source for sepsis.  Would be at risk for partial or even complete colonic obstruction given her Crohn's disease -- Follow-up CT scan abdomen and pelvis with contrast performed today -- On antibiotics with ceftriaxone and Flagyl; C. difficile toxin negative -- Blood cultures repeated yesterday with recurrent fever -- Would hold on IV steroids for now given concern for infection  2.  Coffee-ground emesis --holding on EGD for now.  No overt hemodynamically significant GI bleeding. Hgb is rather stable.  Chief Complaint   CT scan of the abdomen pelvis repeated early this afternoon Patient remains intubated and sedated No family at bedside Fever overnight   Vital signs in last 24 hours: Temp:  [98.8 F (37.1 C)-100.9 F (38.3 C)] 98.8 F (37.1 C) (09/30 1143) Pulse Rate:  [107-128] 111 (09/30 0830) Resp:  [12-19] 14 (09/30 0830) BP: (100-123)/(48-82) 113/61 (09/30 0830) SpO2:  [83 %-100 %] 99 % (09/30 0830) FiO2 (%):  [40 %-50 %] 40 % (09/30 0800) Weight:  [75.6 kg] 75.6 kg (09/30 0500) Last BM Date : 01/30/23 Gen: Intubated and sedated HEENT: anicteric, ET tube in place CV: Tachycardic Pulm: Coarse bilaterally  anteriorly Abd: Distended without obvious tenderness, no grimacing with palpation, minimal bowel sounds Ext: no c/c/e    Intake/Output from previous day: 09/29 0701 - 09/30 0700 In: 3070.9 [I.V.:1677.5; NG/GT:752; IV Piggyback:641.4] Out: 900 [Urine:900] Intake/Output this shift: Total I/O In: 685 [I.V.:80; NG/GT:605] Out: 237 [Urine:237]  Lab Results: Recent Labs    01/29/23 0453 01/30/23 0332 01/31/23 0534  WBC 12.6* 13.3* 13.4*  HGB 13.3 11.9* 11.3*  HCT 39.9 36.5 34.4*  PLT 185 146* 148*   BMET Recent Labs    01/29/23 0453 01/30/23 0332 01/31/23 0534  NA 135 138 142  K 3.6 3.3* 4.5  CL 102 103 109  CO2 22 19* 25  GLUCOSE 122* 118* 135*  BUN 23* 17 14  CREATININE 1.00 1.10* 1.03*  CALCIUM 7.7* 7.6* 8.0*   LFT Recent Labs    01/31/23 0534  PROT 4.4*  ALBUMIN <1.5*  AST 51*  ALT 37  ALKPHOS 166*  BILITOT 0.8     Studies/Results: DG Chest Port 1 View  Result Date: 01/30/2023 CLINICAL DATA:  Acute respiratory failure. EXAM: PORTABLE CHEST 1 VIEW COMPARISON:  Radiographs 01/27/2023.  CT 06/18/2022. FINDINGS: 0643 hours. The endotracheal and enteric tubes are unchanged. Right IJ central venous catheter is unchanged at the level of the superior cavoatrial junction. The heart size and mediastinal contours are stable. Stable mild bibasilar atelectasis. No confluent airspace disease, pleural effusion or pneumothorax. The bones appear unremarkable. Telemetry leads overlie the chest. IMPRESSION: Stable chest. Stable mild bibasilar atelectasis. Stable support system. Electronically Signed   By: Carey Bullocks M.D.   On: 01/30/2023 10:48      LOS: 4 days   Carie Caddy  Carrieanne Kleen, MD 01/31/2023, 1:34 PM See Loretha Stapler, [facility], to contact our on call provider

## 2023-01-31 NOTE — Progress Notes (Signed)
Nutrition Follow-up  DOCUMENTATION CODES:  Not applicable  INTERVENTION:  Recommend the following via cortrak on placement confirmed: Osmolite 1.2 at 60 ml/h (1440 ml per day) Pt showing signs of refeeding. Start at 20 and hold until phosphorus is replaced. Once stable, advance by 10mL q6h to goal of 60 Prosource TF20 60 ml 1x/d Provides 1808 kcal, 100 gm protein, 1181 ml free water daily Adding thiamine 100 mg x 7 days  NUTRITION DIAGNOSIS:  Inadequate oral intake related to inability to eat as evidenced by NPO status. - remains applicable   GOAL:  Patient will meet greater than or equal to 90% of their needs - progressing, TF initiated  MONITOR:  Vent status, Labs, Weight trends, I & O's  REASON FOR ASSESSMENT:  Ventilator    ASSESSMENT:  Pt with hx of GERD, IBS, crohn's, COPD, HTN, PTSD, and DM type 2 presented to [facility] ED after being found down at home with bloody emesis nearby. Intubated and transferred to [facility].  9/26 - presented to [facility] ED, intubated, transferred to [facility] 9/29 - TF initiated, off pressors 9/30 - cortrak placed, imaging pending  Pt resting in bed, remains intubated on ventilator support. TF initiated yesterday and pt showing signs of refeeding. RPH replacing this AM but will hold rate at a trickle until levels have stabilized. Adjusted TF formula to previously recommended.   Pt to have OGT exchanged for cortrak today as she would likely benefit from nutrition and access for medications after she is extubated.   MV: 8.0 L/min Temp (24hrs), Avg:99.2 F (37.3 C), Min:98.2 F (36.8 C), Max:100.9 F (38.3 C)   Intake/Output Summary (Last 24 hours) at 01/31/2023 0805 Last data filed at 01/31/2023 0600 Gross per 24 hour  Intake 2864.92 ml  Output 900 ml  Net 1964.92 ml  Net IO Since Admission: 11,482.55 mL [01/31/23 0805]  Admit weight: 63.5 kg Current weight: 75.6 kg   Nutritionally Relevant Medications: Scheduled Meds:   feeding supplement (PROSource TF20)  60 mL Daily   insulin aspart  0-9 Units Q4H   pantoprazole (PROTONIX) IV  40 mg Q12H   polyethylene glycol  17 g Daily   potassium chloride  40 mEq BID   thiamine  100 mg Daily   Continuous Infusions:  cefTRIAXone (ROCEPHIN)  IV Stopped (01/30/23 1652)   feeding supplement (OSMOLITE 1.2 CAL)     lactated ringers 75 mL/hr at 01/31/23 0600   levETIRAcetam Stopped (01/30/23 2143)   metronidazole Stopped (01/31/23 0203)   sodium phosphate 30 mmol in dextrose 5 % 250 mL infusion     Labs Reviewed: Creatinine 1.03 Phosphorus <1.0 CBG ranges from 103-131 mg/dL over the last 24 hours HgbA1c 5.8%  NUTRITION - FOCUSED PHYSICAL EXAM: Flowsheet Row Most Recent Value  Orbital Region No depletion  Upper Arm Region No depletion  Thoracic and Lumbar Region No depletion  Buccal Region No depletion  Temple Region Mild depletion  Clavicle Bone Region No depletion  Clavicle and Acromion Bone Region No depletion  Scapular Bone Region Unable to assess  Dorsal Hand No depletion  Patellar Region Mild depletion  Anterior Thigh Region Mild depletion  Posterior Calf Region Moderate depletion  Edema (RD Assessment) Mild  [forearms]  Hair Reviewed  Eyes Reviewed  Mouth Reviewed  Skin Reviewed  Nails Reviewed    Diet Order:   Diet Order             Diet NPO time specified  Diet effective now  EDUCATION NEEDS:  Not appropriate for education at this time  Skin:  Skin Assessment: Reviewed RN Assessment  Last BM:  9/29 - type 7  Height:  Ht Readings from Last 1 Encounters:  01/29/23 5' 2.99" (1.6 m)    Weight:  Wt Readings from Last 1 Encounters:  01/31/23 75.6 kg    Ideal Body Weight:  52.3 kg  BMI:  Body mass index is 29.53 kg/m.  Estimated Nutritional Needs:  Kcal:  1700-1900 kcal/d Protein:  85-100 g/d Fluid:  >1.8L/d    Greig Castilla, RD, LDN Clinical Dietitian RD pager # available in AMION  After  hours/weekend pager # available in [facility]

## 2023-01-31 NOTE — Progress Notes (Signed)
Pt transported on ventilator from 2M02 to CT 2 and back without complication. RT, RN and transport accompanied pt.

## 2023-02-01 DIAGNOSIS — K920 Hematemesis: Secondary | ICD-10-CM

## 2023-02-01 DIAGNOSIS — K50119 Crohn's disease of large intestine with unspecified complications: Secondary | ICD-10-CM | POA: Diagnosis not present

## 2023-02-01 DIAGNOSIS — I469 Cardiac arrest, cause unspecified: Secondary | ICD-10-CM | POA: Diagnosis not present

## 2023-02-01 DIAGNOSIS — J9601 Acute respiratory failure with hypoxia: Secondary | ICD-10-CM | POA: Diagnosis not present

## 2023-02-01 LAB — CBC
HCT: 37.1 % (ref 36.0–46.0)
Hemoglobin: 12.1 g/dL (ref 12.0–15.0)
MCH: 28 pg (ref 26.0–34.0)
MCHC: 32.6 g/dL (ref 30.0–36.0)
MCV: 85.9 fL (ref 80.0–100.0)
Platelets: 178 10*3/uL (ref 150–400)
RBC: 4.32 MIL/uL (ref 3.87–5.11)
RDW: 16.5 % — ABNORMAL HIGH (ref 11.5–15.5)
WBC: 15.5 10*3/uL — ABNORMAL HIGH (ref 4.0–10.5)
nRBC: 0.2 % (ref 0.0–0.2)

## 2023-02-01 LAB — COMPREHENSIVE METABOLIC PANEL
ALT: 34 U/L (ref 0–44)
AST: 39 U/L (ref 15–41)
Albumin: <1.5 g/dL — ABNORMAL LOW (ref 3.5–5.0)
Alkaline Phosphatase: 159 U/L — ABNORMAL HIGH (ref 38–126)
Anion gap: 9 (ref 5–15)
BUN: 10 mg/dL (ref 6–20)
CO2: 25 mmol/L (ref 22–32)
Calcium: 7.6 mg/dL — ABNORMAL LOW (ref 8.9–10.3)
Chloride: 109 mmol/L (ref 98–111)
Creatinine, Ser: 0.6 mg/dL (ref 0.44–1.00)
GFR, Estimated: >60 mL/min (ref >60)
Glucose, Bld: 160 mg/dL — ABNORMAL HIGH (ref 70–99)
Potassium: 3.8 mmol/L (ref 3.5–5.1)
Sodium: 143 mmol/L (ref 135–145)
Total Bilirubin: 0.5 mg/dL (ref 0.3–1.2)
Total Protein: 4.4 g/dL — ABNORMAL LOW (ref 6.5–8.1)

## 2023-02-01 LAB — CULTURE, BLOOD (ROUTINE X 2)

## 2023-02-01 LAB — PHOSPHORUS: Phosphorus: 2.4 mg/dL — ABNORMAL LOW (ref 2.5–4.6)

## 2023-02-01 LAB — GLUCOSE, CAPILLARY
Glucose-Capillary: 127 mg/dL — ABNORMAL HIGH (ref 70–99)
Glucose-Capillary: 138 mg/dL — ABNORMAL HIGH (ref 70–99)
Glucose-Capillary: 138 mg/dL — ABNORMAL HIGH (ref 70–99)
Glucose-Capillary: 139 mg/dL — ABNORMAL HIGH (ref 70–99)
Glucose-Capillary: 146 mg/dL — ABNORMAL HIGH (ref 70–99)
Glucose-Capillary: 166 mg/dL — ABNORMAL HIGH (ref 70–99)
Glucose-Capillary: 170 mg/dL — ABNORMAL HIGH (ref 70–99)

## 2023-02-01 LAB — MAGNESIUM: Magnesium: 2 mg/dL (ref 1.7–2.4)

## 2023-02-01 MED ORDER — POTASSIUM PHOSPHATES 15 MMOLE/5ML IV SOLN
30.0000 mmol | Freq: Once | INTRAVENOUS | Status: AC
  Administered 2023-02-01: 30 mmol via INTRAVENOUS
  Filled 2023-02-01: qty 10

## 2023-02-01 MED ORDER — K PHOS MONO-SOD PHOS DI & MONO 155-852-130 MG PO TABS: 250.0000 mg | ORAL_TABLET | Freq: Once | ORAL | Status: DC

## 2023-02-01 NOTE — Progress Notes (Signed)
NAMELetasha [name], MRN:  [id number], DOB:  Sep 19, 1970, LOS: 5 ADMISSION DATE:  01/27/2023, CONSULTATION DATE: 01/27/2023 REFERRING MD: EDP, CHIEF COMPLAINT: Postcardiac arrest  History of Present Illness:   This is a 52 year old female with past medical history of COPD, Crohn's on Skyrizi, hypertension, type 2 diabetes mellitus who initially presented to the after being found unresponsive.  Per family, patient has had bloody emesis with diarrhea over the past few days.  Initial systolics in the 60s in the ED, and patient was minimally responsive.  In the emergency room, patient was intubated, and patient was admitted to the ICU for further evaluation and management for shock and altered mental status.  Pertinent  Medical History   Anxiety, asthma, chronic low back pain, Crohn's colitis, GERD type 2 diabetes mellitus, history of seizure disorder  Significant Hospital Events: Including procedures, antibiotic start and stop dates in addition to other pertinent events   01/27/2023: Admitted to ICU and intubated 9/29 Off pressors  Interim History / Subjective:   Overnight events: No overnight events  Patient patient evaluated bedside this morning. Not able to participate in the physician-patient interview.   Objective   Blood pressure 96/64, pulse (!) 128, temperature 99.9 F (37.7 C), temperature source Bladder, resp. rate 18, height 5' 2.99" (1.6 m), weight 76.7 kg, SpO2 97%.    Maps currently ranging between 50-75, off pressors tachycardic into the 110s,  afebrile, currently satting at 97-100% on current ventilator settings of FiO2 30%.  Vent Mode: PRVC FiO2 (%):  [30 %-40 %] 30 % Set Rate:  [15 bmp] 15 bmp Vt Set:  [420 mL] 420 mL PEEP:  [5 cmH20] 5 cmH20 Pressure Support:  [8 cmH20] 8 cmH20 Plateau Pressure:  [14 cmH20] 14 cmH20   Intake/Output Summary (Last 24 hours) at 02/01/2023 0651 Last data filed at 02/01/2023 0600 Gross per 24 hour  Intake 2161.81 ml  Output 1472 ml   Net 689.81 ml   Filed Weights   01/27/23 1102 01/31/23 0500 02/01/23 0458  Weight: 68.9 kg 75.6 kg 76.7 kg    Examination: General: Patient intubated and sedated, critically ill Eyes: Pupils equal and reactive to light Head: Normocephalic, atraumatic  Cardio: Regular rate and rhythm, no murmurs, rubs or gallops. 2+ pulses to bilateral upper and lower extremities  Pulmonary: Coarse breath sounds bilaterally Abdomen: Soft, with tenderness to light touch Neuro: Intubated, able to withdraw to painful stimuli  Skin: No rashes noted  MSK: 5/5 strength to upper and lower extremities.   Labs reviewed: Labs reviewed: Magnesium 2.0 CBC white count 15.5, hemoglobin 12.1, platelets 178, Phosphorus 2.4 CMP sodium 143, potassium 3.8, bicarb 25, creatinine 0.60, albumin less than 1.5 Glucose ranging between 127-141  Cultures with no growth  Imaging: CT abdomen pelvis with contrast yesterday showing mild to moderate diffuse colitis and proctitis and small bowel enteritis.  Small amount of free peritoneal fluid could represent a developing abscess associated with peritonitis.  Resolved Hospital Problem list     Assessment & Plan:  This is a 52 year old female with a past medical history of COPD, hypertension, type 2 diabetes, Crohn's colitis on Skyrizi who presents with bloody emesis.  Patient was found to be altered.  Patient was subsequently intubated and admitted for further evaluation management.  #Acute septic encephalopathy #Acute hypoxemic respiratory failure #Cardiac arrest Patient did wean well yesterday.  Unfortunately, patient not waiting SNF, therefore remained intubated.  Patient has been off of sedation.  Patient was able to pull good lung volumes  on SBT.  Patient is still more awake today, plan will be to extubate today as patient did well with weaning off ventilator.   -SBT today, have patient awake more -If patient can become more awake, can potentially extubate  today  #Septic shock versus hypovolemic shock #Colitis versus peritonitis Today is Flagyl and ceftriaxone day 6.  Patient did fever yesterday, but none overnight.  Repeat CT does show concern for colitis and proctitis.  There is concern for some developing abscess.  There is some free fluid on CT.  Will have IR evaluate for drain today.  Could likely culture this fluid if patient is able to get drain placed.  Identify potential organism causing this.  Patient has been weaned off sedation and pressors.   -Continue with Flagyl and Ceftriaxone day 6 -IR consult-Too small to intervene  -Keep off pressors -MAP goal greater than 65  #Prerenal AKI, resolved #Hyponatremia, resolved #Hypophosphatemia, resolving AKI has been resolved.  Hyponatremia has been resolved.  Phosphorus is improving with repletion.  Will replete again today. -Continue to monitor BMP -Continue to monitor phosphorus   #Type 2 diabetes mellitus.  Glucose measuring well at this time.  Currently on sliding scale insulin. -Continue sliding scale insulin -Monitor CBGs  #History of seizures No concern for seizure-like activity.  Doing well. -Continue Keppra  #Anxiety Home medication includes Xanax 1 mg 3 times daily.  Would like to avoid given large amount Xanax, but would want to prevent any withdrawal. -Continue 0.25 mg Xanax 3 times daily  #Hypertension Maps are soft at times.  Patient does take metoprolol tartrate 25 mg twice daily at home. -Continue to monitor blood pressure closely -Hold home metoprolol  #COPD No concern for exacerbation at this time.  Will continue with as needed albuterol.  #Nutrition #Concern for refeeding syndrome Phosphorus is improving.  Will replete today. -Continue tube feeds -Continue phosphorus repletion -Trend mag and phosphorus  Best Practice (right click and "Reselect all SmartList Selections" daily)   Diet/type: tubefeeds DVT prophylaxis: SCD.  Start DVT prophylaxis.,Hep  SQ GI prophylaxis: PPI Lines: Central line Foley:  Yes, and it is still needed Code Status:  full code.  Daughter updated 9/28  Critical care time:    The patient is critically ill with multiple organ system failure and requires high complexity decision making for assessment and support, frequent evaluation and titration of therapies, advanced monitoring, review of radiographic studies and interpretation of complex data.   Critical Care Time devoted to patient care services, exclusive of separately billable procedures, described in this note is 35 minutes.   Modena Slater, DO  Internal Medicine Resident PGY-2 [phone]

## 2023-02-01 NOTE — Plan of Care (Signed)
  Problem: Clinical Measurements: Goal: Will remain free from infection Outcome: Progressing   Problem: Nutrition: Goal: Adequate nutrition will be maintained Outcome: Progressing   

## 2023-02-01 NOTE — Progress Notes (Signed)
Progress Note   Assessment    52 year old female with history of colonic Crohn's disease with rectal/rectosigmoid stricture on Skyrizi (dx 1998), history of recurrent C. difficile, COPD, diabetes, seizure disorder admitted with concern for sepsis of unclear etiology, acute kidney injury and septic encephalopathy requiring mechanical ventilation    Recommendations   Colonic Crohn's with hx of stricture/sepsis with no clear source: -- repeat CT shows mild to moderate colitis; question of distal small bowel enteritis; mildly improved ileus; small amt peritoneal fluid with possible early rim enhancement Query a Crohn's related complication; no evidence of bowel perforation She is on Skyrizi GI pathogen neg, C diff toxin neg (Antigen pos)  --Steroids would be indicated in Crohn's flare, but I hesitate for now until we are sure infectious possibilities contained --Could consider flex sig to evaluate degree of colitis and exclude CMV --Cont antibiotics  2. Prev coffee ground emesis -- no overt bleeding, Hgb stable.  No EGD at this point.    Chief Complaint   Plan is for extubation today (hopeful) No further fevers No overnight events  Vital signs in last 24 hours: Temp:  [99.3 F (37.4 C)-100 F (37.8 C)] 99.9 F (37.7 C) (10/01 0408) Pulse Rate:  [110-129] 126 (10/01 1444) Resp:  [13-28] 23 (10/01 1444) BP: (87-124)/(27-108) 98/48 (10/01 1444) SpO2:  [95 %-100 %] 97 % (10/01 1444) FiO2 (%):  [30 %-40 %] 30 % (10/01 1200) Weight:  [76.7 kg] 76.7 kg (10/01 0458) Last BM Date : 02/01/23 Gen: arousable HEENT: anicteric, ett in place  CV: tachycardic Pulm: course b/l Abd: soft, distended, tender by grimace, hypoactive bowel sounds Ext: no c/c/e  Intake/Output from previous day: 09/30 0701 - 10/01 0700 In: 2131.7 [I.V.:556.8; NG/GT:1003; IV Piggyback:571.9] Out: 1472 [Urine:1472] Intake/Output this shift: Total I/O In: 661.9 [I.V.:105; NG/GT:182.5; IV  Piggyback:374.4] Out: 410 [Urine:410]  Lab Results: Recent Labs    01/30/23 0332 01/31/23 0534 02/01/23 0445  WBC 13.3* 13.4* 15.5*  HGB 11.9* 11.3* 12.1  HCT 36.5 34.4* 37.1  PLT 146* 148* 178   BMET Recent Labs    01/30/23 0332 01/31/23 0534 02/01/23 0445  NA 138 142 143  K 3.3* 4.5 3.8  CL 103 109 109  CO2 19* 25 25  GLUCOSE 118* 135* 160*  BUN 17 14 10   CREATININE 1.10* 1.03* 0.60  CALCIUM 7.6* 8.0* 7.6*   LFT Recent Labs    02/01/23 0445  PROT 4.4*  ALBUMIN <1.5*  AST 39  ALT 34  ALKPHOS 159*  BILITOT 0.5   PT/INR No results for input(s): "LABPROT", "INR" in the last 72 hours. Hepatitis Panel No results for input(s): "HEPBSAG", "HCVAB", "HEPAIGM", "HEPBIGM" in the last 72 hours.  Studies/Results: DG Abd Portable 1V  Result Date: 01/31/2023 CLINICAL DATA:  Feeding tube placement. EXAM: PORTABLE ABDOMEN - 1 VIEW COMPARISON:  01/27/2023 and abdomen and pelvis CT obtained today. FINDINGS: Feeding tube tip in the region of the distal gastric antrum or pylorus. Paucity of intestinal gas. Cholecystectomy clips. Unremarkable bones. IMPRESSION: Feeding tube tip in the region of the distal gastric antrum or pylorus. Electronically Signed   By: Beckie Salts M.D.   On: 01/31/2023 16:52   CT ABDOMEN PELVIS W CONTRAST  Result Date: 01/31/2023 CLINICAL DATA:  Acute right lower quadrant abdominal pain.  Sepsis. EXAM: CT ABDOMEN AND PELVIS WITH CONTRAST TECHNIQUE: Multidetector CT imaging of the abdomen and pelvis was performed using the standard protocol following bolus administration of intravenous contrast. RADIATION DOSE REDUCTION: This exam  was performed according to the departmental dose-optimization program which includes automated exposure control, adjustment of the mA and/or kV according to patient size and/or use of iterative reconstruction technique. CONTRAST:  75mL OMNIPAQUE IOHEXOL 350 MG/ML SOLN COMPARISON:  01/27/2023. FINDINGS: Lower chest: Stable small  pericardial effusion measuring 1.2 cm in maximum thickness. Increased bibasilar dependent atelectasis, left greater than right. Hepatobiliary: Diffuse low density of the liver. Cholecystectomy clips. Pancreas: Moderate diffuse pancreatic atrophy. Spleen: Heterogeneous perfusion of the spleen, persisting on delayed images. Adrenals/Urinary Tract: Foley catheter in the urinary bladder with associated air in the bladder and small amount of urine. Unremarkable adrenal glands, kidneys and ureters. Stomach/Bowel: Nasogastric tube in place with its tip in the mid stomach. Mild and moderate diffuse colonic and rectal wall thickening with mucosal enhancement and mild pericolonic edema. Multiple dilated small bowel loops with mild improvement. Mild low-density wall thickening involving multiple distal small bowel loops. No pneumatosis. The appendix is surgically absent. Vascular/Lymphatic: No significant vascular findings are present. No enlarged abdominal or pelvic lymph nodes. Reproductive: Status post hysterectomy. No adnexal masses. Other: Small amount of free peritoneal fluid. This includes a small amount of fluid in the inferior left pelvis with thin surrounding rim enhancement. Interval mild-to-moderate bilateral subcutaneous edema. Musculoskeletal: Mild lumbar and minimal lower thoracic spine degenerative changes. IMPRESSION: 1. Mild and moderate diffuse colitis and proctitis. 2. Mild distal small bowel enteritis. The proctocolitis and enteritis could be infectious or inflammatory in nature. 3. Mild improvement in small bowel dilatation compatible with mildly improved ileus. 4. Small amount of free peritoneal fluid. This includes a small amount of fluid in the inferior left pelvis with thin surrounding rim enhancement. This could represent a developing abscess associated with peritonitis. 5. Heterogeneous perfusion of the spleen, persisting on delayed images. This is nonspecific and could be due to splenic  hypoperfusion or splenic venous congestion. 6. Stable small pericardial effusion. 7. Increased bibasilar dependent atelectasis, left greater than right. 8. Diffuse hepatic steatosis. 9. Interval mild-to-moderate bilateral subcutaneous edema. Electronically Signed   By: Beckie Salts M.D.   On: 01/31/2023 16:51      LOS: 5 days   Beverley Fiedler, MD 02/01/2023, 3:02 PM See Loretha Stapler, [facility], to contact our on call provider

## 2023-02-01 NOTE — Plan of Care (Signed)
IR was requested for image guided intraabdominal fluid aspiration.drain placement .   Case was reviewed by Dr. Lowella Dandy, there is only small amount of free fluid seen in CT, nothing to be drained.    Ordering provider notified.   Will delete the drain placement order.  Please call IR for questions and concerns.   Lynann Bologna Shaterra Sanzone PA-C 02/01/2023 8:45 AM

## 2023-02-01 NOTE — H&P (View-Only) (Signed)
Progress Note   Assessment    52 year old female with history of colonic Crohn's disease with rectal/rectosigmoid stricture on Skyrizi (dx 1998), history of recurrent C. difficile, COPD, diabetes, seizure disorder admitted with concern for sepsis of unclear etiology, acute kidney injury and septic encephalopathy requiring mechanical ventilation    Recommendations   Colonic Crohn's with hx of stricture/sepsis with no clear source: -- repeat CT shows mild to moderate colitis; question of distal small bowel enteritis; mildly improved ileus; small amt peritoneal fluid with possible early rim enhancement Query a Crohn's related complication; no evidence of bowel perforation She is on Skyrizi GI pathogen neg, C diff toxin neg (Antigen pos)  --Steroids would be indicated in Crohn's flare, but I hesitate for now until we are sure infectious possibilities contained --Could consider flex sig to evaluate degree of colitis and exclude CMV --Cont antibiotics  2. Prev coffee ground emesis -- no overt bleeding, Hgb stable.  No EGD at this point.    Chief Complaint   Plan is for extubation today (hopeful) No further fevers No overnight events  Vital signs in last 24 hours: Temp:  [99.3 F (37.4 C)-100 F (37.8 C)] 99.9 F (37.7 C) (10/01 0408) Pulse Rate:  [110-129] 126 (10/01 1444) Resp:  [13-28] 23 (10/01 1444) BP: (87-124)/(27-108) 98/48 (10/01 1444) SpO2:  [95 %-100 %] 97 % (10/01 1444) FiO2 (%):  [30 %-40 %] 30 % (10/01 1200) Weight:  [76.7 kg] 76.7 kg (10/01 0458) Last BM Date : 02/01/23 Gen: arousable HEENT: anicteric, ett in place  CV: tachycardic Pulm: course b/l Abd: soft, distended, tender by grimace, hypoactive bowel sounds Ext: no c/c/e  Intake/Output from previous day: 09/30 0701 - 10/01 0700 In: 2131.7 [I.V.:556.8; NG/GT:1003; IV Piggyback:571.9] Out: 1472 [Urine:1472] Intake/Output this shift: Total I/O In: 661.9 [I.V.:105; NG/GT:182.5; IV  Piggyback:374.4] Out: 410 [Urine:410]  Lab Results: Recent Labs    01/30/23 0332 01/31/23 0534 02/01/23 0445  WBC 13.3* 13.4* 15.5*  HGB 11.9* 11.3* 12.1  HCT 36.5 34.4* 37.1  PLT 146* 148* 178   BMET Recent Labs    01/30/23 0332 01/31/23 0534 02/01/23 0445  NA 138 142 143  K 3.3* 4.5 3.8  CL 103 109 109  CO2 19* 25 25  GLUCOSE 118* 135* 160*  BUN 17 14 10   CREATININE 1.10* 1.03* 0.60  CALCIUM 7.6* 8.0* 7.6*   LFT Recent Labs    02/01/23 0445  PROT 4.4*  ALBUMIN <1.5*  AST 39  ALT 34  ALKPHOS 159*  BILITOT 0.5   PT/INR No results for input(s): "LABPROT", "INR" in the last 72 hours. Hepatitis Panel No results for input(s): "HEPBSAG", "HCVAB", "HEPAIGM", "HEPBIGM" in the last 72 hours.  Studies/Results: DG Abd Portable 1V  Result Date: 01/31/2023 CLINICAL DATA:  Feeding tube placement. EXAM: PORTABLE ABDOMEN - 1 VIEW COMPARISON:  01/27/2023 and abdomen and pelvis CT obtained today. FINDINGS: Feeding tube tip in the region of the distal gastric antrum or pylorus. Paucity of intestinal gas. Cholecystectomy clips. Unremarkable bones. IMPRESSION: Feeding tube tip in the region of the distal gastric antrum or pylorus. Electronically Signed   By: Beckie Salts M.D.   On: 01/31/2023 16:52   CT ABDOMEN PELVIS W CONTRAST  Result Date: 01/31/2023 CLINICAL DATA:  Acute right lower quadrant abdominal pain.  Sepsis. EXAM: CT ABDOMEN AND PELVIS WITH CONTRAST TECHNIQUE: Multidetector CT imaging of the abdomen and pelvis was performed using the standard protocol following bolus administration of intravenous contrast. RADIATION DOSE REDUCTION: This exam  was performed according to the departmental dose-optimization program which includes automated exposure control, adjustment of the mA and/or kV according to patient size and/or use of iterative reconstruction technique. CONTRAST:  75mL OMNIPAQUE IOHEXOL 350 MG/ML SOLN COMPARISON:  01/27/2023. FINDINGS: Lower chest: Stable small  pericardial effusion measuring 1.2 cm in maximum thickness. Increased bibasilar dependent atelectasis, left greater than right. Hepatobiliary: Diffuse low density of the liver. Cholecystectomy clips. Pancreas: Moderate diffuse pancreatic atrophy. Spleen: Heterogeneous perfusion of the spleen, persisting on delayed images. Adrenals/Urinary Tract: Foley catheter in the urinary bladder with associated air in the bladder and small amount of urine. Unremarkable adrenal glands, kidneys and ureters. Stomach/Bowel: Nasogastric tube in place with its tip in the mid stomach. Mild and moderate diffuse colonic and rectal wall thickening with mucosal enhancement and mild pericolonic edema. Multiple dilated small bowel loops with mild improvement. Mild low-density wall thickening involving multiple distal small bowel loops. No pneumatosis. The appendix is surgically absent. Vascular/Lymphatic: No significant vascular findings are present. No enlarged abdominal or pelvic lymph nodes. Reproductive: Status post hysterectomy. No adnexal masses. Other: Small amount of free peritoneal fluid. This includes a small amount of fluid in the inferior left pelvis with thin surrounding rim enhancement. Interval mild-to-moderate bilateral subcutaneous edema. Musculoskeletal: Mild lumbar and minimal lower thoracic spine degenerative changes. IMPRESSION: 1. Mild and moderate diffuse colitis and proctitis. 2. Mild distal small bowel enteritis. The proctocolitis and enteritis could be infectious or inflammatory in nature. 3. Mild improvement in small bowel dilatation compatible with mildly improved ileus. 4. Small amount of free peritoneal fluid. This includes a small amount of fluid in the inferior left pelvis with thin surrounding rim enhancement. This could represent a developing abscess associated with peritonitis. 5. Heterogeneous perfusion of the spleen, persisting on delayed images. This is nonspecific and could be due to splenic  hypoperfusion or splenic venous congestion. 6. Stable small pericardial effusion. 7. Increased bibasilar dependent atelectasis, left greater than right. 8. Diffuse hepatic steatosis. 9. Interval mild-to-moderate bilateral subcutaneous edema. Electronically Signed   By: Beckie Salts M.D.   On: 01/31/2023 16:51      LOS: 5 days   Beverley Fiedler, MD 02/01/2023, 3:02 PM See Loretha Stapler, [facility], to contact our on call provider

## 2023-02-02 ENCOUNTER — Encounter (HOSPITAL_COMMUNITY)
Admission: EM | Disposition: A | Discharge status: Home Health | Payer: Self-pay | Source: Home / Self Care | Attending: Student

## 2023-02-02 ENCOUNTER — Inpatient Hospital Stay (HOSPITAL_COMMUNITY): Payer: Medicaid Other | Admitting: Anesthesiology

## 2023-02-02 ENCOUNTER — Encounter (HOSPITAL_COMMUNITY): Payer: Self-pay | Admitting: Pulmonary Disease

## 2023-02-02 DIAGNOSIS — K6389 Other specified diseases of intestine: Secondary | ICD-10-CM | POA: Diagnosis not present

## 2023-02-02 DIAGNOSIS — E1122 Type 2 diabetes mellitus with diabetic chronic kidney disease: Secondary | ICD-10-CM

## 2023-02-02 DIAGNOSIS — I129 Hypertensive chronic kidney disease with stage 1 through stage 4 chronic kidney disease, or unspecified chronic kidney disease: Secondary | ICD-10-CM

## 2023-02-02 DIAGNOSIS — K6289 Other specified diseases of anus and rectum: Secondary | ICD-10-CM

## 2023-02-02 DIAGNOSIS — K529 Noninfective gastroenteritis and colitis, unspecified: Secondary | ICD-10-CM | POA: Diagnosis not present

## 2023-02-02 DIAGNOSIS — N189 Chronic kidney disease, unspecified: Secondary | ICD-10-CM

## 2023-02-02 DIAGNOSIS — I469 Cardiac arrest, cause unspecified: Secondary | ICD-10-CM | POA: Diagnosis not present

## 2023-02-02 HISTORY — PX: FLEXIBLE SIGMOIDOSCOPY: SHX5431

## 2023-02-02 LAB — GLUCOSE, CAPILLARY
Glucose-Capillary: 101 mg/dL — ABNORMAL HIGH (ref 70–99)
Glucose-Capillary: 128 mg/dL — ABNORMAL HIGH (ref 70–99)
Glucose-Capillary: 101 mg/dL — ABNORMAL HIGH (ref 70–99)
Glucose-Capillary: 99 mg/dL (ref 70–99)
Glucose-Capillary: 124 mg/dL — ABNORMAL HIGH (ref 70–99)

## 2023-02-02 LAB — CBC
HCT: 33.4 % — ABNORMAL LOW (ref 36.0–46.0)
Hemoglobin: 10.9 g/dL — ABNORMAL LOW (ref 12.0–15.0)
MCH: 28.3 pg (ref 26.0–34.0)
MCHC: 32.6 g/dL (ref 30.0–36.0)
MCV: 86.8 fL (ref 80.0–100.0)
Platelets: 207 10*3/uL (ref 150–400)
RBC: 3.85 MIL/uL — ABNORMAL LOW (ref 3.87–5.11)
RDW: 16.5 % — ABNORMAL HIGH (ref 11.5–15.5)
WBC: 16.8 10*3/uL — ABNORMAL HIGH (ref 4.0–10.5)
nRBC: 0.4 % — ABNORMAL HIGH (ref 0.0–0.2)

## 2023-02-02 LAB — BASIC METABOLIC PANEL WITH GFR
Anion gap: 6 (ref 5–15)
BUN: 8 mg/dL (ref 6–20)
CO2: 27 mmol/L (ref 22–32)
Calcium: 7.5 mg/dL — ABNORMAL LOW (ref 8.9–10.3)
Chloride: 110 mmol/L (ref 98–111)
Creatinine, Ser: 0.53 mg/dL (ref 0.44–1.00)
GFR, Estimated: >60 mL/min
Glucose, Bld: 125 mg/dL — ABNORMAL HIGH (ref 70–99)
Potassium: 4 mmol/L (ref 3.5–5.1)
Sodium: 143 mmol/L (ref 135–145)

## 2023-02-02 LAB — PHOSPHORUS: Phosphorus: 2.9 mg/dL (ref 2.5–4.6)

## 2023-02-02 LAB — MAGNESIUM: Magnesium: 2 mg/dL (ref 1.7–2.4)

## 2023-02-02 LAB — CALPROTECTIN, FECAL: Calprotectin, Fecal: 562 ug/g — ABNORMAL HIGH (ref 0–120)

## 2023-02-02 SURGERY — SIGMOIDOSCOPY, FLEXIBLE
Anesthesia: Monitor Anesthesia Care

## 2023-02-02 MED ORDER — MAGIC MOUTHWASH
1.0000 mL | Freq: Three times a day (TID) | ORAL | Status: DC | PRN
  Administered 2023-02-02: 1 mL via ORAL
  Filled 2023-02-02 (×3): qty 5

## 2023-02-02 MED ORDER — SODIUM CHLORIDE 0.9 % IV SOLN: INTRAVENOUS | Status: DC

## 2023-02-02 MED ORDER — GERHARDT'S BUTT CREAM
TOPICAL_CREAM | Freq: Two times a day (BID) | CUTANEOUS | Status: DC
  Administered 2023-02-06 – 2023-03-06 (×7): 1 via TOPICAL
  Filled 2023-02-02 (×7): qty 1

## 2023-02-02 MED ORDER — LACTATED RINGERS IV SOLN: INTRAVENOUS | Status: DC

## 2023-02-02 MED ORDER — PROPOFOL 10 MG/ML IV BOLUS
INTRAVENOUS | Status: DC | PRN
  Administered 2023-02-02: 70 mg via INTRAVENOUS

## 2023-02-02 MED ORDER — VANCOMYCIN HCL 125 MG PO CAPS
125.0000 mg | ORAL_CAPSULE | Freq: Four times a day (QID) | ORAL | Status: DC
  Filled 2023-02-02 (×3): qty 1

## 2023-02-02 MED ORDER — VANCOMYCIN 50 MG/ML ORAL SOLUTION
125.0000 mg | Freq: Four times a day (QID) | ORAL | Status: DC
  Administered 2023-02-02 – 2023-02-08 (×24): 125 mg
  Filled 2023-02-02 (×27): qty 2.5

## 2023-02-02 MED ORDER — BANATROL TF EN LIQD
60.0000 mL | Freq: Two times a day (BID) | ENTERAL | Status: DC
  Administered 2023-02-02 – 2023-02-09 (×14): 60 mL
  Filled 2023-02-02 (×16): qty 60

## 2023-02-02 NOTE — Interval H&P Note (Signed)
History and Physical Interval Note: For flexible sigmoidoscopy today to evaluate activity of Crohn's colitis and proctitis Extubated earlier this morning Procedure discussed in detail including the risk, benefits and alternatives with the patient but also with her daughter Hospital doctor by phone.  Both voiced understanding and wished to proceed   02/02/2023 2:40 PM  [name]  has presented today for surgery, with the diagnosis of colitis, history of Crohn's disease.  The various methods of treatment have been discussed with the patient and family. After consideration of risks, benefits and other options for treatment, the patient has consented to  Procedure(s): FLEXIBLE SIGMOIDOSCOPY (N/A) as a surgical intervention.  The patient's history has been reviewed, patient examined, no change in status, stable for surgery.  I have reviewed the patient's chart and labs.  Questions were answered to the patient's satisfaction.     Carie Caddy Arlone Lenhardt

## 2023-02-02 NOTE — Anesthesia Preprocedure Evaluation (Addendum)
Anesthesia Evaluation  Patient identified by MRN, date of birth, ID band Patient awake    Reviewed: Allergy & Precautions, NPO status , Patient's Chart, lab work & pertinent test results  History of Anesthesia Complications Negative for: history of anesthetic complications  Airway Mallampati: II  TM Distance: >3 FB Neck ROM: Full    Dental  (+) Dental Advisory Given, Poor Dentition   Pulmonary Current Smoker and Patient abstained from smoking. Septic shock requiring intubation: extubated this am   breath sounds clear to auscultation       Cardiovascular hypertension (pn no meds presently), + dysrhythmias Supra Ventricular Tachycardia  Rhythm:Regular Rate:Tachycardia  03/2022 ECHO: EF 60 to 65%.  1. The LV has normal function, no regional wall motion abnormalities. Left ventricular diastolic parameters are consistent with Grade I diastolic dysfunction (impaired relaxation).   2. RVF is normal. The right ventricular size is normal.   3. A small pericardial effusion is present. The pericardial effusion is circumferential. There is no evidence of cardiac tamponade.     Neuro/Psych  PSYCHIATRIC DISORDERS (PTSD) Anxiety Depression    Has been confused, encephalopathic with sepsis    GI/Hepatic Neg liver ROS,,,colonic Crohn's disease with rectal/rectosigmoid stricture   Endo/Other  negative endocrine ROSDiabetes: glu 101.    Renal/GU Renal InsufficiencyRenal disease     Musculoskeletal  (+) Arthritis ,    Abdominal   Peds  Hematology   Anesthesia Other Findings   Reproductive/Obstetrics                             Anesthesia Physical Anesthesia Plan  ASA: 4  Anesthesia Plan: MAC   Post-op Pain Management: Minimal or no pain anticipated   Induction:   PONV Risk Score and Plan: 1 and Treatment may vary due to age or medical condition  Airway Management Planned: Natural Airway and Simple  Face Mask  Additional Equipment: None  Intra-op Plan:   Post-operative Plan:   Informed Consent: I have reviewed the patients History and Physical, chart, labs and discussed the procedure including the risks, benefits and alternatives for the proposed anesthesia with the patient or authorized representative who has indicated his/her understanding and acceptance.     Dental advisory given and Consent reviewed with POA  Plan Discussed with: CRNA and Surgeon  Anesthesia Plan Comments: (Discussed with daughter Janan Ridge by telephone)       Anesthesia Quick Evaluation

## 2023-02-02 NOTE — Progress Notes (Signed)
[name], MRN:  [id number], DOB:  1970/09/29, LOS: 6 ADMISSION DATE:  01/27/2023, CONSULTATION DATE: 01/27/2023 REFERRING MD: EDP, CHIEF COMPLAINT: Postcardiac arrest  History of Present Illness:   This is a 52 year old female with past medical history of COPD, Crohn's on Skyrizi, hypertension, type 2 diabetes mellitus who initially presented to the after being found unresponsive.  Per family, patient has had bloody emesis with diarrhea over the past few days.  Initial systolics in the 60s in the ED, and patient was minimally responsive.  In the emergency room, patient was intubated, and patient was admitted to the ICU for further evaluation and management for shock and altered mental status.  Pertinent  Medical History   Anxiety, asthma, chronic low back pain, Crohn's colitis, GERD type 2 diabetes mellitus, history of seizure disorder  Significant Hospital Events: Including procedures, antibiotic start and stop dates in addition to other pertinent events   01/27/2023: Admitted to ICU and intubated 9/29 Off pressors 10/2: Extubated  Interim History / Subjective:   Overnight events: No acute events overnight  Patient patient evaluated bedside this morning.  Patient is more awake.  Patient reports she is still having some abdominal pain.  She is able to communicate with me with head nods and moving her hands.  Objective   Blood pressure (!) 119/59, pulse (!) 121, temperature 98.3 F (36.8 C), temperature source Axillary, resp. rate (!) 24, height 5' 2.99" (1.6 m), weight 75.4 kg, SpO2 100%.     Vent Mode: PRVC FiO2 (%):  [30 %] 30 % Set Rate:  [15 bmp] 15 bmp Vt Set:  [420 mL] 420 mL PEEP:  [5 cmH20] 5 cmH20 Pressure Support:  [5 cmH20] 5 cmH20   Intake/Output Summary (Last 24 hours) at 02/02/2023 0645 Last data filed at 02/02/2023 2725 Gross per 24 hour  Intake 1683.05 ml  Output 1060 ml  Net 623.05 ml   Filed Weights   01/31/23 0500 02/01/23 0458 02/02/23 0500   Weight: 75.6 kg 76.7 kg 75.4 kg    Examination: General: Patient intubated and awake, critically ill Eyes: Pupils equal and reactive to light Head: Normocephalic, atraumatic  Cardio: Tachycardic, regular rhythm, no murmurs, rubs, or gallops Pulmonary: Coarse breath sounds bilaterally Abdomen: Soft, nondistended, tenderness Neuro: Intubated, able to follow all instructions. Skin: No rashes noted  MSK: 2/5 strength noted to bilateral lower extremities  Resolved Hospital Problem list   Acute Kidney Injury  Hyponatremia Hypophosphatemia  Septic Shock   Assessment & Plan:  This is a 52 year old female with a past medical history of COPD, hypertension, type 2 diabetes, Crohn's colitis on Skyrizi who presents with bloody emesis.  Patient was found to be altered.  Patient was subsequently intubated and admitted for further evaluation management.  #Acute septic encephalopathy, resolved #Acute hypoxemic respiratory failure, resolving #Cardiac arrest Patient has been weaning well.  Weaned for 16 hours yesterday.  Is more awake today.  Patient is able to follow all instructions on my exam.  Will plan to extubate today.  Patient has been weaned off all pressors. -Extubate today -PT/OT -Delirium precautions  #Septic shock, resolved #Inflammatory versus infectious colitis Today is Flagyl and ceftriaxone day 7.  Plan for flex Sig today.  Will follow-up with GI.  Will be able to distinguish between inflammatory versus infectious cause.  -Continue with Flagyl and Ceftriaxone day 7 -Flex sig today -GI following, appreciate recommendations  #Type 2 diabetes mellitus.  Glucose measuring well at this time.  Currently on sliding scale  insulin. -Continue sliding scale insulin -Monitor CBGs  #History of seizures Still on Keppra.  No concern for seizure-like activity.  Doing well. -Continue Keppra  #Anxiety Held Xanax yesterday, and patient woke up more.  Will continue to hold Xanax at this  time. -Hold home Xanax   #Hypertension Patient does take metoprolol tartrate 25 mg twice daily at home. No acute concerns for hypertension at this time  -Continue to monitor blood pressure closely -Hold home metoprolol  #COPD No concern for exacerbation at this time.  Will continue with as needed albuterol.  #Nutrition -Holding tube feeds for procedure today, can start feeds after patient is done with procedure and patient will be evaluated for swallow today   Best Practice (right click and "Reselect all SmartList Selections" daily)   Diet/type: tubefeeds DVT prophylaxis: Heparin  GI prophylaxis: PPI Lines: Central line Foley:  Yes, and it is still needed Code Status:  full code.  Daughter updated 10/2  Critical care time:    The patient is critically ill with multiple organ system failure and requires high complexity decision making for assessment and support, frequent evaluation and titration of therapies, advanced monitoring, review of radiographic studies and interpretation of complex data.   Critical Care Time devoted to patient care services, exclusive of separately billable procedures, described in this note is 35 minutes.   Modena Slater, DO  Internal Medicine Resident PGY-2 [phone]

## 2023-02-02 NOTE — Anesthesia Postprocedure Evaluation (Signed)
Anesthesia Post Note  Patient: [name]  Procedure(s) Performed: FLEXIBLE SIGMOIDOSCOPY     Patient location during evaluation: Endoscopy Anesthesia Type: MAC Level of consciousness: sedated (pt remains disoriented, as was preop) Pain management: pain level controlled Vital Signs Assessment: post-procedure vital signs reviewed and stable Respiratory status: nonlabored ventilation, spontaneous breathing, respiratory function stable and patient connected to nasal cannula oxygen Cardiovascular status: stable Postop Assessment: no apparent nausea or vomiting Anesthetic complications: no   No notable events documented.  Last Vitals:  Vitals:   02/02/23 1513 02/02/23 1520  BP: (!) 105/38 (!) 114/45  Pulse: (!) 119 (!) 120  Resp: (!) 22 (!) 24  Temp: (!) 36.3 C   SpO2: 100% 99%    Last Pain:  Vitals:   02/02/23 1520  TempSrc:   PainSc: 0-No pain                 Era Parr,E. Blimy Napoleon

## 2023-02-02 NOTE — Transfer of Care (Signed)
Immediate Anesthesia Transfer of Care Note  Patient: [name]  Procedure(s) Performed: FLEXIBLE SIGMOIDOSCOPY  Patient Location: Endoscopy Unit  Anesthesia Type:MAC  Level of Consciousness: awake and drowsy  Airway & Oxygen Therapy: Patient Spontanous Breathing and Patient connected to face mask oxygen  Post-op Assessment: Report given to RN, Post -op Vital signs reviewed and stable, and Patient moving all extremities  Post vital signs: Reviewed and stable  Last Vitals:  Vitals Value Taken Time  BP    Temp    Pulse    Resp    SpO2      Last Pain:  Vitals:   02/02/23 1310  TempSrc: Temporal  PainSc: 0-No pain         Complications: No notable events documented.

## 2023-02-02 NOTE — Progress Notes (Signed)
eLink Physician-Brief Progress Note Patient Name: [name] DOB: Sep 13, 1970 MRN: [id number]   Date of Service  02/02/2023  HPI/Events of Note  52 yo, PMH crohns disease on skyrizi, HTN DMII. Concern for ongoing colitis.  Underwent flexible sigmoidoscopy today.  Extubated.  Has remained persistently tachycardic pre and post procedure without significant change in blood pressure.  Running 120s currently without other hemodynamic instability.  Sinus tachycardia   eICU Interventions  Continue observation, no intervention currently indicated.  If there is a change in rhythm or sustained heart rate greater than 140, will require reevaluation.     Intervention Category Intermediate Interventions: Arrhythmia - evaluation and management  Mace Weinberg 02/02/2023, 9:40 PM

## 2023-02-02 NOTE — Progress Notes (Signed)
PT Cancellation Note  Patient Details Name: Pierra Skora MRN: [id number] DOB: 1970/08/30   Cancelled Treatment:    Reason Eval/Treat Not Completed: Patient at procedure or test/unavailable. Pt heading off unit to endoscopy. PT will follow up as time allows.   Arlyss Gandy 02/02/2023, 12:52 PM

## 2023-02-02 NOTE — Procedures (Signed)
Extubation Procedure Note  Patient Details:   Name: [name] DOB: 1971/04/11 MRN: [id number]   Airway Documentation:    Vent end date: 02/02/23 Vent end time: 0930   Evaluation  O2 sats: stable throughout Complications: No apparent complications Patient did tolerate procedure well. Bilateral Breath Sounds: Diminished   Yes  Pt extubated per physician order. Pt suctioned via ETT/orally prior with a positive cuff leak. Upon extubation pt placed on 3L nasal cannula without complication. Pt able to speak name, give a good cough and no stridor heard at this time. RT will continue to monitor and be available as needed.   Derinda Late 02/02/2023, 9:36 AM

## 2023-02-02 NOTE — Progress Notes (Signed)
Nutrition Follow-up  DOCUMENTATION CODES:  Not applicable  INTERVENTION:  Recommend restarting the following via cor trak until pt has passed a swallow evaluation and is eating adequate PO: Osmolite 1.2 at 60 ml/H (1440 ml per day) Start at 40 and continue to advance by 10mL q4h to goal of 60 Prosource TF20 60 ml 1x/d Provides 1808 kcal, 100 gm protein, 1181 ml free water daily Add banatrol BID to aid in management of loose stools Continue thiamine 100 mg through 7 day duration  NUTRITION DIAGNOSIS:  Inadequate oral intake related to inability to eat as evidenced by NPO status. - remains applicable   GOAL:  Patient will meet greater than or equal to 90% of their needs - progressing, TF initiated  MONITOR:  Vent status, Labs, Weight trends, I & O's  REASON FOR ASSESSMENT:  Ventilator    ASSESSMENT:  Pt with hx of GERD, IBS, crohn's, COPD, HTN, PTSD, and DM type 2 presented to [facility] ED after being found down at home with bloody emesis nearby. Intubated and transferred to [facility].  9/26 - presented to [facility] ED, intubated, transferred to [facility] 9/29 - TF initiated, off pressors 9/30 - cor trak placed, imaging pending 10/2 - extubated  Pt resting in bed at the time of assessment. Extubated this AM. Inquired about abdominal pain, pt reports that her stomach feels not great - but ok. Pt had her TF held at midnight, scheduled for a sigmoidoscopy today.  Discussed in rounds, RN to do swallow evaluation when pt returns but will restart TF once out of procedure to ensure that nutrition needs are met until pt begins eating adequately.    Intake/Output Summary (Last 24 hours) at 02/02/2023 1213 Last data filed at 02/02/2023 1034 Gross per 24 hour  Intake 1509.03 ml  Output 800 ml  Net 709.03 ml  Net IO Since Admission: 13,093 mL [02/02/23 1213]  Admit weight: 63.5 kg Current weight: 75.4 kg   Nutritionally Relevant Medications: Scheduled Meds:  feeding  supplement (PROSource TF20)  60 mL Per Tube Daily   insulin aspart  0-9 Units Subcutaneous Q4H   pantoprazole (PROTONIX) IV  40 mg Intravenous Q12H   thiamine  100 mg Per Tube Daily   Continuous Infusions:  sodium chloride 20 mL/hr at 02/02/23 1000   cefTRIAXone (ROCEPHIN)  IV Stopped (02/01/23 1627)   feeding supplement (OSMOLITE 1.2 CAL) Stopped (02/02/23 0000)   metronidazole Stopped (02/02/23 0332)   Labs Reviewed: CBG ranges from 101-170 mg/dL over the last 24 hours HgbA1c 5.8%  NUTRITION - FOCUSED PHYSICAL EXAM: Flowsheet Row Most Recent Value  Orbital Region No depletion  Upper Arm Region No depletion  Thoracic and Lumbar Region No depletion  Buccal Region No depletion  Temple Region Mild depletion  Clavicle Bone Region No depletion  Clavicle and Acromion Bone Region No depletion  Scapular Bone Region Unable to assess  Dorsal Hand No depletion  Patellar Region Mild depletion  Anterior Thigh Region Mild depletion  Posterior Calf Region Moderate depletion  Edema (RD Assessment) Mild  [forearms]  Hair Reviewed  Eyes Reviewed  Mouth Reviewed  Skin Reviewed  Nails Reviewed    Diet Order:   Diet Order             Diet NPO time specified  Diet effective now                   EDUCATION NEEDS:  Not appropriate for education at this time  Skin:  Skin Assessment: Reviewed RN Assessment  Last BM:  10/1 - type 7 FMS placed 10/1  Height:  Ht Readings from Last 1 Encounters:  01/29/23 5' 2.99" (1.6 m)    Weight:  Wt Readings from Last 1 Encounters:  02/02/23 75.4 kg    Ideal Body Weight:  52.3 kg  BMI:  Body mass index is 29.45 kg/m.  Estimated Nutritional Needs:  Kcal:  1700-1900 kcal/d Protein:  85-100 g/d Fluid:  >1.8L/d    Greig Castilla, RD, LDN Clinical Dietitian RD pager # available in [facility]  After hours/weekend pager # available in [facility]

## 2023-02-02 NOTE — Op Note (Signed)
[facility] Patient Name: [name] Procedure Date : 02/02/2023 MRN: [id number] Attending MD: Beverley Fiedler , MD, [id number] Date of Birth: 10-11-1970 CSN: [id number] Age: 52 Admit Type: Inpatient Procedure:                Flexible Sigmoidoscopy Indications:              Personal history of Crohn's disease with recent                            admission for sepsis, abnl CT colon Providers:                Carie Caddy. Rhea Belton, MD, Glory Rosebush, RN, Priscella Mann,                            Technician Referring MD:             Chilton Greathouse, MD Medicines:                Propofol per Anesthesia Complications:            No immediate complications. Estimated Blood Loss:     Estimated blood loss: none. Procedure:                Pre-Anesthesia Assessment:                           - Prior to the procedure, a History and Physical                            was performed, and patient medications and                            allergies were reviewed. The patient's tolerance of                            previous anesthesia was also reviewed. The risks                            and benefits of the procedure and the sedation                            options and risks were discussed with the patient.                            All questions were answered, and informed consent                            was obtained. Prior Anticoagulants: The patient has                            taken no anticoagulant or antiplatelet agents. ASA                            Grade Assessment: III - A patient with severe  systemic disease. After reviewing the risks and                            benefits, the patient was deemed in satisfactory                            condition to undergo the procedure.                           After obtaining informed consent, the scope was                            passed under direct vision. The PCF-190TL ([id number])                             Olympus colonoscope was introduced through the anus                            and advanced to the rectosigmoid junction. The                            flexible sigmoidoscopy was accomplished without                            difficulty. The patient tolerated the procedure                            well. No bowel preparation was given prior to the                            procedure. The quality of visualization was poor. Scope In: Scope Out: Findings:      The digital rectal exam was normal.      A large amount of stool was found in the recto-sigmoid colon and in the       sigmoid colon, making visualization difficult. Lavage of the area was       performed, resulting in incomplete clearance with fair visualization.      Diffuse severe mucosal changes characterized by ulceration (particularly       distal rectum), congestion (edema), erythema, friability, granularity       and loss of vascularity were found in the rectum, in the recto-sigmoid       colon and in the distal sigmoid colon. There was thick adherent white       plaques and mucus throughout the visible mucosa. Patchy dusky mucosa       consistent with ischemic type injury.      Retroflexion in the rectum was not performed. Impression:               - Stool in the recto-sigmoid colon and in the                            sigmoid colon.                           - Diffuse severe mucosal changes were found in the  rectum, in the recto-sigmoid colon and in the                            distal sigmoid colon consistent with Crohn's                            disease and likely consistent with infectious                            colitis. See description above.                           - No specimens collected. Moderate Sedation:      N/A Recommendation:           - Return patient to hospital ward for ongoing care.                           - Advance diet as tolerated.                            - Continue present medications.                           - Add vancomycin 125 mg PO QID x 14 days (given                            endoscopic findings today, overall presentation                            without other infectious source and her personal                            history of C diff colitis).                           - Repeat flexible sigmoidoscopy versus colonoscopy                            can be repeated if needed based on clinical course.                           - Would not start IV steroids at this moment. Procedure Code(s):        --- Professional ---                           [phone], 52, Sigmoidoscopy, flexible; diagnostic,                            including collection of specimen(s) by brushing or                            washing, when performed (separate procedure) Diagnosis Code(s):        --- Professional ---  K62.89, Other specified diseases of anus and rectum                           K63.89, Other specified diseases of intestine                           Z87.19, Personal history of other diseases of the                            digestive system CPT copyright 2022 American [employer] Association. All rights reserved. The codes documented in this report are preliminary and upon coder review may  be revised to meet current compliance requirements. Beverley Fiedler, MD 02/02/2023 3:25:42 PM This report has been signed electronically. Number of Addenda: 0

## 2023-02-03 DIAGNOSIS — K529 Noninfective gastroenteritis and colitis, unspecified: Secondary | ICD-10-CM | POA: Diagnosis not present

## 2023-02-03 DIAGNOSIS — A0472 Enterocolitis due to Clostridium difficile, not specified as recurrent: Secondary | ICD-10-CM

## 2023-02-03 DIAGNOSIS — K5090 Crohn's disease, unspecified, without complications: Secondary | ICD-10-CM | POA: Diagnosis not present

## 2023-02-03 DIAGNOSIS — I469 Cardiac arrest, cause unspecified: Secondary | ICD-10-CM | POA: Diagnosis not present

## 2023-02-03 LAB — CBC
HCT: 33 % — ABNORMAL LOW (ref 36.0–46.0)
Hemoglobin: 10.5 g/dL — ABNORMAL LOW (ref 12.0–15.0)
MCH: 27.3 pg (ref 26.0–34.0)
MCHC: 31.8 g/dL (ref 30.0–36.0)
MCV: 85.7 fL (ref 80.0–100.0)
Platelets: 294 10*3/uL (ref 150–400)
RBC: 3.85 MIL/uL — ABNORMAL LOW (ref 3.87–5.11)
RDW: 16.1 % — ABNORMAL HIGH (ref 11.5–15.5)
WBC: 18.6 10*3/uL — ABNORMAL HIGH (ref 4.0–10.5)
nRBC: 0.4 % — ABNORMAL HIGH (ref 0.0–0.2)

## 2023-02-03 LAB — COMPREHENSIVE METABOLIC PANEL
ALT: 25 U/L (ref 0–44)
AST: 26 U/L (ref 15–41)
Albumin: <1.5 g/dL — ABNORMAL LOW (ref 3.5–5.0)
Alkaline Phosphatase: 139 U/L — ABNORMAL HIGH (ref 38–126)
Anion gap: 8 (ref 5–15)
BUN: 8 mg/dL (ref 6–20)
CO2: 23 mmol/L (ref 22–32)
Calcium: 7.2 mg/dL — ABNORMAL LOW (ref 8.9–10.3)
Chloride: 109 mmol/L (ref 98–111)
Creatinine, Ser: 0.62 mg/dL (ref 0.44–1.00)
GFR, Estimated: >60 mL/min (ref >60)
Glucose, Bld: 179 mg/dL — ABNORMAL HIGH (ref 70–99)
Potassium: 3.7 mmol/L (ref 3.5–5.1)
Sodium: 140 mmol/L (ref 135–145)
Total Bilirubin: 0.3 mg/dL (ref 0.3–1.2)
Total Protein: 4.1 g/dL — ABNORMAL LOW (ref 6.5–8.1)

## 2023-02-03 LAB — GLUCOSE, CAPILLARY
Glucose-Capillary: 133 mg/dL — ABNORMAL HIGH (ref 70–99)
Glucose-Capillary: 138 mg/dL — ABNORMAL HIGH (ref 70–99)
Glucose-Capillary: 166 mg/dL — ABNORMAL HIGH (ref 70–99)
Glucose-Capillary: 170 mg/dL — ABNORMAL HIGH (ref 70–99)
Glucose-Capillary: 171 mg/dL — ABNORMAL HIGH (ref 70–99)
Glucose-Capillary: 185 mg/dL — ABNORMAL HIGH (ref 70–99)
Glucose-Capillary: 201 mg/dL — ABNORMAL HIGH (ref 70–99)

## 2023-02-03 LAB — MAGNESIUM: Magnesium: 1.7 mg/dL (ref 1.7–2.4)

## 2023-02-03 MED ORDER — ACETAMINOPHEN 325 MG PO TABS
650.0000 mg | ORAL_TABLET | Freq: Four times a day (QID) | ORAL | Status: DC | PRN
  Administered 2023-02-03 – 2023-02-08 (×3): 650 mg
  Filled 2023-02-03 (×3): qty 2

## 2023-02-03 MED ORDER — METRONIDAZOLE 500 MG PO TABS
500.0000 mg | ORAL_TABLET | Freq: Two times a day (BID) | ORAL | Status: AC
  Administered 2023-02-03 – 2023-02-06 (×7): 500 mg
  Filled 2023-02-03 (×7): qty 1

## 2023-02-03 MED ORDER — MAGNESIUM SULFATE 2 GM/50ML IV SOLN
2.0000 g | Freq: Once | INTRAVENOUS | Status: AC
  Administered 2023-02-03: 2 g via INTRAVENOUS
  Filled 2023-02-03: qty 50

## 2023-02-03 MED ORDER — ORAL CARE MOUTH RINSE: 15.0000 mL | OROMUCOSAL | Status: DC | PRN

## 2023-02-03 MED ORDER — POTASSIUM CHLORIDE 20 MEQ PO PACK
40.0000 meq | PACK | Freq: Once | ORAL | Status: AC
  Administered 2023-02-03: 40 meq
  Filled 2023-02-03: qty 2

## 2023-02-03 NOTE — Progress Notes (Signed)
Patient is stable for transfer. Aware of MEWS score. Stable to go to the floor.

## 2023-02-03 NOTE — Progress Notes (Signed)
[facility] ADULT ICU REPLACEMENT PROTOCOL   The patient does apply for the [facility] Adult ICU Electrolyte Replacment Protocol based on the criteria listed below:   1.Exclusion criteria: TCTS, ECMO, Dialysis, and Myasthenia Gravis patients 2. Is GFR >/= 30 ml/min? Yes.    Patient's GFR today is >60 3. Is SCr </= 2? Yes.   Patient's SCr is 0.62 mg/dL 4. Did SCr increase >/= 0.5 in 24 hours? No. 5.Pt's weight >40kg  Yes.   6. Abnormal electrolyte(s): Mag 1.7  7. Electrolytes replaced per protocol 8.  Call MD STAT for K+ </= 2.5, Phos </= 1, or Mag </= 1 Physician:  Larinda Buttery [facility] 02/03/2023 5:29 AM

## 2023-02-03 NOTE — Progress Notes (Signed)
Responded to consult for IV access. [place] assessment of both arms completed. No appropriate veins found for peripheral IV/ML. Observed veins are too small for IV access. Recommend keeping central line or considering alternate access.

## 2023-02-03 NOTE — Progress Notes (Signed)
[facility] ROUNDING NOTE   Subjective: Completed flexible sigmoidoscopy yesterday which was notable for severe colitis with pseudomembranous appearance.  Was started on oral vancomycin and held off on initiating steroids due to infection.  Appears to be clinically improving and was transferred out of ICU.  Rectal tube still in place.   Objective: Vital signs in last 24 hours: Temp:  [97.8 F (36.6 C)-99.1 F (37.3 C)] 98.1 F (36.7 C) (10/03 1512) Pulse Rate:  [124-145] 124 (10/03 1700) Resp:  [16-35] 22 (10/03 1700) BP: (84-127)/(39-94) 109/69 (10/03 1700) SpO2:  [95 %-100 %] 100 % (10/03 1700) Weight:  [75.2 kg] 75.2 kg (10/03 0306) Last BM Date : 02/02/23 General: NAD Abdomen: Mild generalized TTP without rebound or guarding.  No peritoneal signs.  Soft.   Intake/Output from previous day: 10/02 0701 - 10/03 0700 In: 1235.1 [I.V.:284.1; NG/GT:457.3; IV Piggyback:440.6] Out: 1350 [Urine:600; Stool:750] Intake/Output this shift: Total I/O In: 289.5 [I.V.:50; NG/GT:200; IV Piggyback:39.5] Out: 400 [Urine:400]   Lab Results: Recent Labs    02/01/23 0445 02/02/23 0413 02/03/23 0428  WBC 15.5* 16.8* 18.6*  HGB 12.1 10.9* 10.5*  PLT 178 207 294  MCV 85.9 86.8 85.7   BMET Recent Labs    02/01/23 0445 02/02/23 0413 02/03/23 0428  NA 143 143 140  K 3.8 4.0 3.7  CL 109 110 109  CO2 25 27 23   GLUCOSE 160* 125* 179*  BUN 10 8 8   CREATININE 0.60 0.53 0.62  CALCIUM 7.6* 7.5* 7.2*   LFT Recent Labs    02/01/23 0445 02/03/23 0428  PROT 4.4* 4.1*  ALBUMIN <1.5* <1.5*  AST 39 26  ALT 34 25  ALKPHOS 159* 139*  BILITOT 0.5 0.3   PT/INR No results for input(s): "INR" in the last 72 hours.    Imaging/Other results: No results found.    Assessment and Plan:  52 year old female with history of colonic Crohn's disease with rectal/rectosigmoid stricture on Skyrizi (dx 1998), history of recurrent C. difficile, COPD, diabetes, seizure disorder  admitted with concern for sepsis of unclear etiology, acute kidney injury and septic encephalopathy requiring mechanical ventilation.  Despite negative C. difficile toxin (Ag+ though), flexible sigmoidoscopy on 10/2 with severe colitis with pseudomembranous features.  WBC 18.6, uptrending from yesterday.  Otherwise H/H stable.  Normal renal function.  Severe hypoalbuminemia consistent with severe acute illness.  - Continue oral vancomycin - Continue broad-spectrum ABX for the time being - Still hesitant to initiate IV steroids given infectious concerns - While she has made some progress in the last 24 hours, overall prognosis still remains guarded.  Will follow closely.      Shellia Cleverly, DO  02/03/2023, 6:27 PM [facility] Pager [phone]

## 2023-02-03 NOTE — Evaluation (Signed)
Occupational Therapy Evaluation Patient Details Name: [name] MRN: [id number] DOB: 09-07-1970 Today's Date: 02/03/2023   History of Present Illness 52 yo female admitted 9/26 after found down for an unspecified amount of time with possible GIB, agonal breathing and pulseless with CPR 5 min. Intubated in [facility] ED and transferred to [facility]. Pt with AKI, elevated lactate, septic shock. 10/2 extubated and EGD. PMhx: COPD, chron's, HTN, DM, PTSD, seizure disorder, chronic back pain   Clinical Impression   Pt was living independently prior to admission, ambulating with a cane. Presents with impaired cognition, restlessness with pulling at lines, generalized weakness, poor standing balance and inability to ambulate. Pt transfers with +2 mod assist, she was unable to ambulate. Pt requires mod to total assist for ADLs. Pt able to maintain O2 sats 89% or above on RA, HR to 146 with activity. Patient will benefit from continued inpatient follow up therapy, <3 hours/day. Will follow acutely.        If plan is discharge home, recommend the following: Two people to help with walking and/or transfers;Two people to help with bathing/dressing/bathroom;Assistance with cooking/housework;Direct supervision/assist for medications management;Direct supervision/assist for financial management;Assist for transportation;Help with stairs or ramp for entrance    Functional Status Assessment  Patient has had a recent decline in their functional status and demonstrates the ability to make significant improvements in function in a reasonable and predictable amount of time.  Equipment Recommendations  Other (comment) (defer to next venue)    Recommendations for Other Services       Precautions / Restrictions Precautions Precautions: Fall;Other (comment) Precaution Comments: watch HR and SPO2, flexiseal Restrictions Weight Bearing Restrictions: No      Mobility Bed Mobility Overal bed mobility: Needs  Assistance Bed Mobility: Rolling, Sidelying to Sit Rolling: Min assist, Used rails, +2 for safety/equipment Sidelying to sit: +2 for physical assistance, Mod assist       General bed mobility comments: mod +2 to roll to right and rise to sitting. Increased time and max assist to initiate movement    Transfers Overall transfer level: Needs assistance Equipment used: Rolling walker (2 wheels) Transfers: Sit to/from Stand, Bed to chair/wheelchair/BSC Sit to Stand: Mod assist, +2 physical assistance Stand pivot transfers: Mod assist, +2 physical assistance         General transfer comment: Pt with mod +2 assist to rise from bed x 2 trials with left knee blocked. pt with hip flexion and anterior lean needing max cue to use UB to push through RW. Pt able to pivot with mod +2 assist and RW from bed to chair but again flexing in standing during pericare with fatigue and pt with BSC traded for recliner behind pt.      Balance Overall balance assessment: Needs assistance Sitting-balance support: Feet supported, Bilateral upper extremity supported Sitting balance-Leahy Scale: Fair Sitting balance - Comments: EOB with guarding   Standing balance support: Bilateral upper extremity supported, Reliant on assistive device for balance, During functional activity Standing balance-Leahy Scale: Poor Standing balance comment: UB support on RW in standing                           ADL either performed or assessed with clinical judgement   ADL Overall ADL's : Needs assistance/impaired Eating/Feeding: NPO   Grooming: Brushing hair;Sitting;Total assistance   Upper Body Bathing: Moderate assistance;Sitting   Lower Body Bathing: Total assistance;+2 for physical assistance;Sit to/from stand   Upper Body Dressing : Moderate assistance;Sitting  Lower Body Dressing: Total assistance;+2 for physical assistance;Sit to/from stand   Toilet Transfer: +2 for physical assistance;Moderate  assistance;Stand-pivot;Rolling walker (2 wheels);BSC/3in1   Toileting- Clothing Manipulation and Hygiene: Total assistance;+2 for physical assistance;Sit to/from stand               Vision Ability to See in Adequate Light: 0 Adequate Patient Visual Report: No change from baseline       Perception         Praxis         Pertinent Vitals/Pain Pain Assessment Pain Assessment: No/denies pain     Extremity/Trunk Assessment Upper Extremity Assessment Upper Extremity Assessment: Generalized weakness   Lower Extremity Assessment Lower Extremity Assessment: Generalized weakness   Cervical / Trunk Assessment Cervical / Trunk Assessment: Normal   Communication Communication Communication: No apparent difficulties Cueing Techniques: Verbal cues;Tactile cues   Cognition Arousal: Alert Behavior During Therapy: Restless, Flat affect Overall Cognitive Status: Impaired/Different from baseline Area of Impairment: Orientation, Attention, Memory, Following commands, Safety/judgement                 Orientation Level: Disoriented to, Situation, Place Current Attention Level: Sustained Memory: Decreased short-term memory Following Commands: Follows one step commands inconsistently, Follows one step commands with increased time Safety/Judgement: Decreased awareness of deficits, Decreased awareness of safety     General Comments: pt repeatedly reaching for flexiseal, inconsistent command following without awareness of deficits     General Comments       Exercises     Shoulder Instructions      Home Living Family/patient expects to be discharged to:: Private residence Living Arrangements: Alone   Type of Home: House Home Access: Stairs to enter Secretary/administrator of Steps: 2   Home Layout: One level     Bathroom Shower/Tub: Chief Strategy Officer: Standard     Home Equipment: Cane - single point          Prior Functioning/Environment  Prior Level of Function : Independent/Modified Independent;Driving             Mobility Comments: uses cane, drives ADLs Comments: reports limited cooking of simple meals, reports working, but unable to identify where        OT Problem List: Decreased strength;Decreased activity tolerance;Impaired balance (sitting and/or standing);Decreased cognition;Decreased safety awareness;Decreased knowledge of use of DME or AE;Cardiopulmonary status limiting activity      OT Treatment/Interventions: Self-care/ADL training;DME and/or AE instruction;Therapeutic activities;Cognitive remediation/compensation;Patient/family education;Balance training    OT Goals(Current goals can be found in the care plan section) Acute Rehab OT Goals Patient Stated Goal: see grandchildren OT Goal Formulation: With patient Time For Goal Achievement: 02/17/23 Potential to Achieve Goals: Good ADL Goals Pt Will Perform Grooming: with min assist;sitting Pt Will Perform Upper Body Bathing: with min assist;sitting Pt Will Perform Upper Body Dressing: sitting;with supervision Pt Will Transfer to Toilet: with mod assist;bedside commode;stand pivot transfer Additional ADL Goal #1: Pt will perform bed mobility with min assist in preparation for ADLs. Additional ADL Goal #2: Pt will verbally identify items needed to complete ADLs.  OT Frequency: Min 1X/week    Co-evaluation PT/OT/SLP Co-Evaluation/Treatment: Yes Reason for Co-Treatment: Complexity of the patient's impairments (multi-system involvement);For patient/therapist safety PT goals addressed during session: Mobility/safety with mobility OT goals addressed during session: ADL's and self-care;Strengthening/ROM      AM-PAC OT "6 Clicks" Daily Activity     Outcome Measure Help from another person eating meals?: Total Help from another person taking care of personal  grooming?: Total Help from another person toileting, which includes using toliet, bedpan, or  urinal?: Total Help from another person bathing (including washing, rinsing, drying)?: A Lot Help from another person to put on and taking off regular upper body clothing?: A Lot Help from another person to put on and taking off regular lower body clothing?: Total 6 Click Score: 8   End of Session Equipment Utilized During Treatment: Gait belt;Rolling walker (2 wheels);Oxygen Nurse Communication: Mobility status;Other (comment) (needs new purewick)  Activity Tolerance: Patient tolerated treatment well Patient left: in chair;with call bell/phone within reach;with chair alarm set  OT Visit Diagnosis: Unsteadiness on feet (R26.81);Other abnormalities of gait and mobility (R26.89);Muscle weakness (generalized) (M62.81);Other symptoms and signs involving cognitive function                Time: 2025-4270 OT Time Calculation (min): 29 min Charges:  OT General Charges $OT Visit: 1 Visit OT Evaluation $OT Eval Moderate Complexity: 1 Mod  Berna Spare, OTR/L Acute Rehabilitation Services Office: [phone]  Evern Bio 02/03/2023, 1:34 PM

## 2023-02-03 NOTE — Progress Notes (Signed)
Pharmacy Electrolyte Replacement  Recent Labs:  Recent Labs    02/02/23 0413 02/03/23 0428  K 4.0 3.7  MG 2.0 1.7  PHOS 2.9  --   CREATININE 0.53 0.62    Low Critical Values (K </= 2.5, Phos </= 1, Mg </= 1) Present: None  MD Contacted: n/a  Plan: KCl 40 mEq per tube x1. Magnesium replaced by Elink. Follow-up labs in AM.   Link Snuffer, PharmD, BCPS, BCCCP Please refer to [facility] for [facility] Pharmacy numbers 02/03/2023, 7:25 AM

## 2023-02-03 NOTE — Plan of Care (Signed)
  Problem: Education: Goal: Knowledge of General Education information will improve Description: Including pain rating scale, medication(s)/side effects and non-pharmacologic comfort measures Outcome: Progressing   Problem: Health Behavior/Discharge Planning: Goal: Ability to manage health-related needs will improve Outcome: Progressing   Problem: Clinical Measurements: Goal: Ability to maintain clinical measurements within normal limits will improve Outcome: Progressing Goal: Will remain free from infection Outcome: Progressing Goal: Diagnostic test results will improve Outcome: Progressing Goal: Respiratory complications will improve Outcome: Progressing Goal: Cardiovascular complication will be avoided Outcome: Progressing   Problem: Activity: Goal: Risk for activity intolerance will decrease Outcome: Progressing   Problem: Nutrition: Goal: Adequate nutrition will be maintained Outcome: Progressing   Problem: Coping: Goal: Level of anxiety will decrease Outcome: Progressing   Problem: Elimination: Goal: Will not experience complications related to bowel motility Outcome: Progressing Goal: Will not experience complications related to urinary retention Outcome: Progressing   Problem: Pain Managment: Goal: General experience of comfort will improve Outcome: Progressing   Problem: Safety: Goal: Ability to remain free from injury will improve Outcome: Progressing   Problem: Skin Integrity: Goal: Risk for impaired skin integrity will decrease Outcome: Progressing   Problem: Safety: Goal: Non-violent Restraint(s) Outcome: Progressing   Problem: Education: Goal: Knowledge of General Education information will improve Description: Including pain rating scale, medication(s)/side effects and non-pharmacologic comfort measures Outcome: Progressing   Problem: Health Behavior/Discharge Planning: Goal: Ability to manage health-related needs will improve Outcome:  Progressing   Problem: Clinical Measurements: Goal: Ability to maintain clinical measurements within normal limits will improve Outcome: Progressing   Problem: Clinical Measurements: Goal: Will remain free from infection Outcome: Progressing   Problem: Clinical Measurements: Goal: Diagnostic test results will improve Outcome: Progressing   Problem: Clinical Measurements: Goal: Respiratory complications will improve Outcome: Progressing   Problem: Clinical Measurements: Goal: Cardiovascular complication will be avoided Outcome: Progressing   Problem: Activity: Goal: Risk for activity intolerance will decrease Outcome: Progressing   Problem: Nutrition: Goal: Adequate nutrition will be maintained Outcome: Progressing   Problem: Coping: Goal: Level of anxiety will decrease Outcome: Progressing   Problem: Elimination: Goal: Will not experience complications related to bowel motility Outcome: Progressing   Problem: Elimination: Goal: Will not experience complications related to urinary retention Outcome: Progressing   Problem: Pain Managment: Goal: General experience of comfort will improve Outcome: Progressing   Problem: Safety: Goal: Non-violent Restraint(s) Outcome: Progressing   Problem: Safety: Goal: Non-violent Restraint(s) Outcome: Progressing   Problem: Skin Integrity: Goal: Risk for impaired skin integrity will decrease Outcome: Progressing   Problem: Safety: Goal: Ability to remain free from injury will improve Outcome: Progressing   Problem: Pain Managment: Goal: General experience of comfort will improve Outcome: Progressing   Problem: Elimination: Goal: Will not experience complications related to urinary retention Outcome: Progressing

## 2023-02-03 NOTE — Evaluation (Addendum)
Clinical/Bedside Swallow Evaluation Patient Details  Name: [name] MRN: [id number] Date of Birth: 07/12/1970  Today's Date: 02/03/2023 Time: SLP Start Time (ACUTE ONLY): 0946 SLP Stop Time (ACUTE ONLY): 1003 SLP Time Calculation (min) (ACUTE ONLY): 17 min  Past Medical History:  Past Medical History:  Diagnosis Date   Anxiety    Asthma    Chronic low back pain    Collagen vascular disease (HCC)    COPD (chronic obstructive pulmonary disease) (HCC)    Crohn's disease (HCC)    GERD (gastroesophageal reflux disease)    EGD 01/2007 by Dr.Rourke small hiatal hernia s/p 56 french maloney    History of head injury    Hypertension    IBS (irritable bowel syndrome)    PSVT (paroxysmal supraventricular tachycardia) (HCC)    PTSD (post-traumatic stress disorder)    Recurrent chest pain    Seizure disorder (HCC)    Type 2 diabetes mellitus (HCC)    Past Surgical History:  Past Surgical History:  Procedure Laterality Date   ABDOMINAL HYSTERECTOMY     with right salpingo oophorectomy 2005   APPENDECTOMY  2006   BALLOON DILATION N/A 02/19/2021   Procedure: BALLOON DILATION;  Surgeon: Lanelle Bal, DO;  [facility];  Service: Endoscopy;  Laterality: N/A;  Sigmoid colon stricture   BIOPSY  02/06/2021   Procedure: BIOPSY;  Surgeon: Lanelle Bal, DO;  Location: [facility];  Service: Endoscopy;;   BIOPSY  02/19/2021   Procedure: BIOPSY;  Surgeon: Lanelle Bal, DO;  [facility]: [facility];  Service: Endoscopy;;   BIOPSY  07/15/2021   Procedure: BIOPSY;  Surgeon: Malissa Hippo, MD;  [facility]: [facility];  Service: Endoscopy;;   CESAREAN SECTION     1996   CHOLECYSTECTOMY     COLONOSCOPY  2010   Dr. Jena Gauss; negative except for hemorrhoids   ESOPHAGEAL DILATION N/A 10/25/2014   Procedure: ESOPHAGEAL DILATION;  Surgeon: Malissa Hippo, MD;  [facility]: [facility];  Service: Endoscopy;  Laterality: N/A;   ESOPHAGOGASTRODUODENOSCOPY N/A 10/25/2014    Procedure: ESOPHAGOGASTRODUODENOSCOPY (EGD);  Surgeon: Malissa Hippo, MD;  [facility]: [facility];  Service: Endoscopy;  Laterality: N/A;  1250   FLEXIBLE SIGMOIDOSCOPY N/A 02/06/2021   Procedure: FLEXIBLE SIGMOIDOSCOPY;  Surgeon: Lanelle Bal, DO;  [facility];  Service: Endoscopy;  Laterality: N/A;   FLEXIBLE SIGMOIDOSCOPY N/A 02/19/2021   Procedure: FLEXIBLE SIGMOIDOSCOPY;  Surgeon: Lanelle Bal, DO;  [facility];  Service: Endoscopy;  Laterality: N/A;   OOPHORECTOMY     left for torsion and ovarian fibroma; uterine myoma resected 1995   SIGMOIDOSCOPY  07/15/2021   Procedure: SIGMOIDOSCOPY;  Surgeon: Malissa Hippo, MD;  [facility]: [facility];  Service: Endoscopy;;   HPI:  52 year old female with history of colonic Crohn's disease with rectal/rectosigmoid stricture on Skyrizi (dx 1998), history of recurrent C. difficile, COPD, diabetes, seizure disorder admitted with concern for sepsis of unclear etiology, acute kidney injury and septic encephalopathy requiring mechanical ventilation ETT 9/26-10/2.    Assessment / Plan / Recommendation  Clinical Impression  Pt participated in clinical swallow assessment. Confused, oriented to person, had difficulty following commands.  Quality of phonation was decent in light of six-day intubation. Demonstrated impaired comprehension of instructions. Had difficulty with recognition/manipulation of material in mouth due to AMS. Held materials orally; eventually expectorated applesauce. Required max prompts to sip from cup edge and swallow liquid. There was intermittent coughing after consumption of water  when consumed in sequential boluses.  Recommend allowing small sips of water and ice chips after oral care; otherwise, not ready for oral diet primarily due to mental status. SLP will follow for readiness; D/W RN. SLP Visit Diagnosis: Dysphagia, unspecified (R13.10)    Aspiration Risk    tba   Diet Recommendation    NPO;Other (Comment) (ice chips, small sips water)  Medication Administration: Via alternative means    Other  Recommendations Oral Care Recommendations: Oral care prior to ice chip/H20;Oral care QID    Recommendations for follow up therapy are one component of a multi-disciplinary discharge planning process, led by the attending physician.  Recommendations may be updated based on patient status, additional functional criteria and insurance authorization.  Follow up Recommendations Other (comment) (tba)          Functional Status Assessment Patient has had a recent decline in their functional status and demonstrates the ability to make significant improvements in function in a reasonable and predictable amount of time.  Frequency and Duration min 2x/week  1 week       Prognosis Prognosis for improved oropharyngeal function: Good      Swallow Study   General Date of Onset: 01/27/23 HPI: 52 year old female with history of colonic Crohn's disease with rectal/rectosigmoid stricture on Skyrizi (dx 1998), history of recurrent C. difficile, COPD, diabetes, seizure disorder admitted with concern for sepsis of unclear etiology, acute kidney injury and septic encephalopathy requiring mechanical ventilation ETT 9/26-10/2. Type of Study: Bedside Swallow Evaluation Previous Swallow Assessment: 12/25/21 bedside evaluation, no dysphagia Diet Prior to this Study: NPO Temperature Spikes Noted: Yes (99) Respiratory Status: Room air History of Recent Intubation: Yes Total duration of intubation (days): 6 days Date extubated: 02/02/23 Behavior/Cognition: Alert;Confused Oral Cavity Assessment: Within Functional Limits Oral Care Completed by SLP: Recent completion by staff Oral Cavity - Dentition: Adequate natural dentition Self-Feeding Abilities: Total assist Patient Positioning: Upright in bed Baseline Vocal Quality: Normal Volitional Cough: Cognitively unable to elicit Volitional Swallow: Unable  to elicit    Oral/Motor/Sensory Function Overall Oral Motor/Sensory Function: Within functional limits   Ice Chips Ice chips: Impaired Presentation: Spoon Oral Phase Impairments: Poor awareness of bolus   Thin Liquid Thin Liquid: Impaired Presentation: Spoon;Straw;Cup Oral Phase Impairments: Poor awareness of bolus Pharyngeal  Phase Impairments: Cough - Immediate    Nectar Thick Nectar Thick Liquid: Not tested   Honey Thick Honey Thick Liquid: Not tested   Puree Puree: Impaired Presentation: Spoon Oral Phase Impairments: Poor awareness of bolus;Reduced labial seal Oral Phase Functional Implications: Oral holding   Solid     Solid: Not tested     Garreth Burnsworth L. Samson Frederic, MA CCC/SLP Clinical Specialist - Acute Care SLP Acute Rehabilitation Services Office number [phone]  Blenda Mounts Laurice 02/03/2023,10:11 AM

## 2023-02-03 NOTE — Progress Notes (Signed)
Pt transferred to 5W room 23, without any complication, stable, belongings place at bedside.

## 2023-02-03 NOTE — Evaluation (Addendum)
Physical Therapy Evaluation Patient Details Name: [name] MRN: [id number] DOB: Jul 04, 1970 Today's Date: 02/03/2023  History of Present Illness  52 yo female admitted 9/26 after found down for an unspecified amount of time with possible GIB, agonal breathing and pulseless with CPR 5 min. Intubated in [facility] ED and transferred to [facility]. Pt with AKI, elevated lactate, septic shock. 10/2 extubated and EGD. PMhx: COPD, chron's, HTN, DM, PTSD, seizure disorder, chronic back pain  Clinical Impression  Pt pleasant with decreased attention, orientation, awareness, function and strength. Pt reports living alone and using a cane. Pt stating she is able to care for herself but notes from prior admission state she requires supervision for aDLs and no other family present to confirm PLOF. Pt impulsive at time, restless reaching for flexiseal and HR 130-146 with activity. SPO2 99% on RA at rest. Pt will benefit from acute therapy to maximize mobility, function and independence. Patient will benefit from continued inpatient follow up therapy, <3 hours/day   HR 130-146  99% RA drop to 89% with initial stand.  104/66 sitting          If plan is discharge home, recommend the following: A lot of help with bathing/dressing/bathroom;Two people to help with walking and/or transfers;Assistance with cooking/housework;Direct supervision/assist for medications management;Assist for transportation;Supervision due to cognitive status;Direct supervision/assist for financial management;Help with stairs or ramp for entrance   Can travel by private vehicle   No    Equipment Recommendations Rolling walker (2 wheels);BSC/3in1;Wheelchair (measurements PT);Wheelchair cushion (measurements PT)  Recommendations for Other Services       Functional Status Assessment Patient has had a recent decline in their functional status and demonstrates the ability to make significant improvements in function in a reasonable and  predictable amount of time.     Precautions / Restrictions Precautions Precautions: Fall;Other (comment) Precaution Comments: watch HR and SPO2, flexiseal      Mobility  Bed Mobility Overal bed mobility: Needs Assistance Bed Mobility: Supine to Sit     Supine to sit: HOB elevated, Mod assist, +2 for safety/equipment, Used rails     General bed mobility comments: mod +2 to roll to right and rise to sitting. Increased time and max assist to initiate movement    Transfers Overall transfer level: Needs assistance   Transfers: Sit to/from Stand, Bed to chair/wheelchair/BSC Sit to Stand: Mod assist, +2 physical assistance Stand pivot transfers: Mod assist, +2 physical assistance         General transfer comment: Pt with mod +2 assist to rise from bed x 2 trials with left knee blocked. pt with hip flexion and anterior lean needing max cue to use UB to push through RW. Pt able to pivot with mod +2 assist and RW from bed to chair but again flexing in standing during pericare with fatigue and pt with BSC traded for recliner behind pt.    Ambulation/Gait               General Gait Details: not yet able  Stairs            Wheelchair Mobility     Tilt Bed    Modified Rankin (Stroke Patients Only)       Balance Overall balance assessment: Needs assistance Sitting-balance support: Feet supported, Bilateral upper extremity supported Sitting balance-Leahy Scale: Fair Sitting balance - Comments: EOB with guarding   Standing balance support: Bilateral upper extremity supported, Reliant on assistive device for balance, During functional activity Standing balance-Leahy Scale: Poor Standing balance  comment: UB support on RW in standing                             Pertinent Vitals/Pain Pain Assessment Pain Assessment: No/denies pain    Home Living Family/patient expects to be discharged to:: Private residence Living Arrangements: Alone   Type of  Home: House Home Access: Stairs to enter   Secretary/administrator of Steps: 2   Home Layout: One level Home Equipment: Cane - single point      Prior Function Prior Level of Function : Independent/Modified Independent;Driving             Mobility Comments: uses cane, drives ADLs Comments: reports limited cooking of simple meals     Extremity/Trunk Assessment   Upper Extremity Assessment Upper Extremity Assessment: Defer to OT evaluation    Lower Extremity Assessment Lower Extremity Assessment: Generalized weakness    Cervical / Trunk Assessment Cervical / Trunk Assessment: Normal  Communication   Communication Communication: No apparent difficulties Cueing Techniques: Verbal cues;Tactile cues  Cognition Arousal: Alert Behavior During Therapy: Restless Overall Cognitive Status: Impaired/Different from baseline Area of Impairment: Orientation, Attention, Memory, Following commands, Safety/judgement                 Orientation Level: Disoriented to, Situation, Place Current Attention Level: Sustained Memory: Decreased short-term memory Following Commands: Follows one step commands inconsistently, Follows one step commands with increased time Safety/Judgement: Decreased awareness of deficits, Decreased awareness of safety     General Comments: pt repeatedly reaching for flexiseal, inconsistent command following without awareness of deficits        General Comments      Exercises     Assessment/Plan    PT Assessment Patient needs continued PT services  PT Problem List Decreased strength;Decreased mobility;Decreased safety awareness;Decreased activity tolerance;Decreased cognition;Decreased balance;Decreased knowledge of use of DME;Decreased coordination       PT Treatment Interventions DME instruction;Gait training;Stair training;Functional mobility training;Therapeutic activities;Patient/family education;Cognitive remediation;Neuromuscular  re-education;Balance training;Therapeutic exercise    PT Goals (Current goals can be found in the Care Plan section)  Acute Rehab PT Goals Patient Stated Goal: go home PT Goal Formulation: With patient Time For Goal Achievement: 02/17/23 Potential to Achieve Goals: Good    Frequency Min 1X/week     Co-evaluation PT/OT/SLP Co-Evaluation/Treatment: Yes Reason for Co-Treatment: Complexity of the patient's impairments (multi-system involvement);For patient/therapist safety PT goals addressed during session: Mobility/safety with mobility         AM-PAC PT "6 Clicks" Mobility  Outcome Measure Help needed turning from your back to your side while in a flat bed without using bedrails?: A Lot Help needed moving from lying on your back to sitting on the side of a flat bed without using bedrails?: A Lot Help needed moving to and from a bed to a chair (including a wheelchair)?: Total Help needed standing up from a chair using your arms (e.g., wheelchair or bedside chair)?: Total Help needed to walk in hospital room?: Total Help needed climbing 3-5 steps with a railing? : Total 6 Click Score: 8    End of Session Equipment Utilized During Treatment: Gait belt Activity Tolerance: Patient tolerated treatment well Patient left: in chair;with call bell/phone within reach;with chair alarm set Nurse Communication: Mobility status;Other (comment) (+2 stand pivot) PT Visit Diagnosis: Other abnormalities of gait and mobility (R26.89);Muscle weakness (generalized) (M62.81)    Time: 2130-8657 PT Time Calculation (min) (ACUTE ONLY): 27 min   Charges:  PT Evaluation $PT Eval Moderate Complexity: 1 Mod   PT General Charges $$ ACUTE PT VISIT: 1 Visit         Merryl Hacker, PT Acute Rehabilitation Services Office: [phone]   [name] 02/03/2023, 12:59 PM

## 2023-02-03 NOTE — Progress Notes (Signed)
NAMEJaileigh [name], MRN:  [id number], DOB:  [birth year], LOS: 7 ADMISSION DATE:  01/27/2023, CONSULTATION DATE: 01/27/2023 REFERRING MD: EDP, CHIEF COMPLAINT: Postcardiac arrest  History of Present Illness:   This is a 52 year old female with past medical history of COPD, Crohn's on Skyrizi, hypertension, type 2 diabetes mellitus who initially presented to the after being found unresponsive.  Per family, patient has had bloody emesis with diarrhea over the past few days.  Initial systolics in the 60s in the ED, and patient was minimally responsive.  In the emergency room, patient was intubated, and patient was admitted to the ICU for further evaluation and management for shock and altered mental status.  Pertinent  Medical History   Anxiety, asthma, chronic low back pain, Crohn's colitis, GERD type 2 diabetes mellitus, history of seizure disorder  Significant Hospital Events: Including procedures, antibiotic start and stop dates in addition to other pertinent events   01/27/2023: Admitted to ICU and intubated 9/29 Off pressors 10/2: Extubated  Interim History / Subjective:   Overnight events: Patient was in sinus tachy with rates in to the 130s   Patient patient evaluated bedside this morning.  Patient states she is doing okay.  She reports having abdominal pain.  Objective   Blood pressure 111/62, pulse (!) 130, temperature 99.1 F (37.3 C), temperature source Axillary, resp. rate (!) 32, height 5' 2.99" (1.6 m), weight 75.2 kg, SpO2 100%.   Intake/Output Summary (Last 24 hours) at 02/03/2023 0720 Last data filed at 02/03/2023 0600 Gross per 24 hour  Intake 1230.1 ml  Output 1350 ml  Net -119.9 ml   Filed Weights   02/02/23 0500 02/02/23 1310 02/03/23 0306  Weight: 75.4 kg 75.4 kg 75.2 kg    Examination: General: Extubated, able to follow instructions, cortrack in place Eyes: Pupils equal and reactive to light, EOMI Head: Normocephalic, atraumatic  Cardio: Tachycardic,  regular rhythm, no murmurs, rubs, or gallops Pulmonary: Coarse breath sounds bilaterally Abdomen: Soft, nondistended, tenderness Neuro: Able to follow all instructions Skin: No rashes noted  MSK: 3/5 strength noted to bilateral lower extremities  Resolved Hospital Problem list   Acute Kidney Injury  Hyponatremia Hypophosphatemia  Septic Shock  Acute hypoxemic respiratory failure  Assessment & Plan:  This is a 52 year old female with a past medical history of COPD, hypertension, type 2 diabetes, Crohn's colitis on Skyrizi who presents with bloody emesis.  Patient was found to be altered.  Patient was subsequently intubated and admitted for further evaluation management.  #Cardiac arrest Patient was extubated yesterday.  Doing well off ventilator.  Will have patient work with PT/OT.   -Extubate today -PT/OT -Delirium precautions -Can transfer to floor today  #Infectious colitis Today is Flagyl and ceftriaxone day 8.  Started vancomycin yesterday after flex sig.  GI is following. -Continue with Flagyl and Ceftriaxone day 8  of 10 -Vancomycin day 2 of 14 -GI following, appreciate recommendations  #Type 2 diabetes mellitus.  Glucose measuring well at this time.  Currently on sliding scale insulin.  Do anticipate that if patient starts eating, sugars will increase. -Continue sliding scale insulin -Monitor CBGs  #History of seizures Still on Keppra.  No concern for seizure-like activity.  Doing well. -Continue Keppra  #Anxiety Given altered mental status, will continue to hold Xanax.  #Hypertension Patient does take metoprolol tartrate 25 mg twice daily at home. No acute concerns for hypertension at this time  -Continue to monitor blood pressure closely -Hold home metoprolol  #COPD No concern for exacerbation  at this time.  Will continue with as needed albuterol.  #Nutrition -Can resume tube feeds today -Speech eval today to see if patient can start taking other  food  Best Practice (right click and "Reselect all SmartList Selections" daily)   Diet/type: tubefeeds DVT prophylaxis: Heparin  GI prophylaxis: PPI Lines: No longer needed.  Order written to d/c  Foley:  N/A Code Status:  full code.  Daughter updated 10/2  Critical care time:    The patient is critically ill with multiple organ system failure and requires high complexity decision making for assessment and support, frequent evaluation and titration of therapies, advanced monitoring, review of radiographic studies and interpretation of complex data.   Critical Care Time devoted to patient care services, exclusive of separately billable procedures, described in this note is 35 minutes.   Modena Slater, DO  Internal Medicine Resident PGY-2 [phone]

## 2023-02-04 DIAGNOSIS — A0472 Enterocolitis due to Clostridium difficile, not specified as recurrent: Secondary | ICD-10-CM | POA: Diagnosis not present

## 2023-02-04 DIAGNOSIS — I469 Cardiac arrest, cause unspecified: Secondary | ICD-10-CM | POA: Diagnosis not present

## 2023-02-04 DIAGNOSIS — K5090 Crohn's disease, unspecified, without complications: Secondary | ICD-10-CM | POA: Diagnosis not present

## 2023-02-04 DIAGNOSIS — K529 Noninfective gastroenteritis and colitis, unspecified: Secondary | ICD-10-CM | POA: Diagnosis not present

## 2023-02-04 LAB — CBC
HCT: 33.1 % — ABNORMAL LOW (ref 36.0–46.0)
Hemoglobin: 10.7 g/dL — ABNORMAL LOW (ref 12.0–15.0)
MCH: 27.6 pg (ref 26.0–34.0)
MCHC: 32.3 g/dL (ref 30.0–36.0)
MCV: 85.5 fL (ref 80.0–100.0)
Platelets: 376 10*3/uL (ref 150–400)
RBC: 3.87 MIL/uL (ref 3.87–5.11)
RDW: 16 % — ABNORMAL HIGH (ref 11.5–15.5)
WBC: 18.6 10*3/uL — ABNORMAL HIGH (ref 4.0–10.5)
nRBC: 0.3 % — ABNORMAL HIGH (ref 0.0–0.2)

## 2023-02-04 LAB — COMPREHENSIVE METABOLIC PANEL
ALT: 19 U/L (ref 0–44)
AST: 22 U/L (ref 15–41)
Albumin: <1.5 g/dL — ABNORMAL LOW (ref 3.5–5.0)
Alkaline Phosphatase: 123 U/L (ref 38–126)
Anion gap: 11 (ref 5–15)
BUN: 11 mg/dL (ref 6–20)
CO2: 19 mmol/L — ABNORMAL LOW (ref 22–32)
Calcium: 7.3 mg/dL — ABNORMAL LOW (ref 8.9–10.3)
Chloride: 107 mmol/L (ref 98–111)
Creatinine, Ser: 0.54 mg/dL (ref 0.44–1.00)
GFR, Estimated: >60 mL/min (ref >60)
Glucose, Bld: 200 mg/dL — ABNORMAL HIGH (ref 70–99)
Potassium: 4.2 mmol/L (ref 3.5–5.1)
Sodium: 137 mmol/L (ref 135–145)
Total Bilirubin: 0.5 mg/dL (ref 0.3–1.2)
Total Protein: 4.3 g/dL — ABNORMAL LOW (ref 6.5–8.1)

## 2023-02-04 LAB — GLUCOSE, CAPILLARY
Glucose-Capillary: 210 mg/dL — ABNORMAL HIGH (ref 70–99)
Glucose-Capillary: 168 mg/dL — ABNORMAL HIGH (ref 70–99)
Glucose-Capillary: 210 mg/dL — ABNORMAL HIGH (ref 70–99)
Glucose-Capillary: 186 mg/dL — ABNORMAL HIGH (ref 70–99)
Glucose-Capillary: 205 mg/dL — ABNORMAL HIGH (ref 70–99)
Glucose-Capillary: 178 mg/dL — ABNORMAL HIGH (ref 70–99)
Glucose-Capillary: 160 mg/dL — ABNORMAL HIGH (ref 70–99)

## 2023-02-04 MED ORDER — METOPROLOL TARTRATE 12.5 MG HALF TABLET
12.5000 mg | ORAL_TABLET | Freq: Two times a day (BID) | ORAL | Status: DC
  Administered 2023-02-04 – 2023-02-05 (×3): 12.5 mg via ORAL
  Filled 2023-02-04 (×3): qty 1

## 2023-02-04 NOTE — Progress Notes (Signed)
Nutrition Follow-up  DOCUMENTATION CODES:   Not applicable  INTERVENTION:  Continue with cortrak  tube feeding; RD to adjust as oral intake increases.  Osmolite 1.2 at 60 ml/h (1440 ml per day) Prosource TF20 60 ml 1 x day  Provides 1808 kcal, 100 gm protein, 1181 ml free water daily    NUTRITION DIAGNOSIS:   Inadequate oral intake related to inability to eat as evidenced by NPO status.   GOAL:   Patient will meet greater than or equal to 90% of their needs   MONITOR:   Diet advancement, Skin, Labs, Weight trends, TF tolerance  REASON FOR ASSESSMENT:   Consult Assessment of nutrition requirement/status  ASSESSMENT:   Pt with hx of GERD, IBS, crohn's, COPD, HTN, PTSD, and DM type 2 presented to [facility] ED after being found down at home with bloody emesis nearby. Intubated and transferred to [facility]  SLP closely following with new advance to full liquid diet.  Suspect pt would benefit with continued Cortrak EN feeding till po intake improves.  Weight history: 02/04/23 70.8 kg  11/13/22 63.5 kg  03/11/22 58.5 kg  02/23/22 59 kg   NUTRITION - FOCUSED PHYSICAL EXAM:  Flowsheet Row Most Recent Value  Orbital Region No depletion  Upper Arm Region No depletion  Thoracic and Lumbar Region No depletion  Buccal Region No depletion  Temple Region Mild depletion  Clavicle Bone Region No depletion  Clavicle and Acromion Bone Region No depletion  Scapular Bone Region Unable to assess  Dorsal Hand No depletion  Patellar Region Mild depletion  Anterior Thigh Region Mild depletion  Posterior Calf Region Moderate depletion  Edema (RD Assessment) Mild  [forearms]  Hair Reviewed  Eyes Reviewed  Mouth Reviewed  Skin Reviewed  Nails Reviewed       Diet Order:   Diet Order             Diet full liquid Room service appropriate? Yes with Assist; Fluid consistency: Thin  Diet effective now                   EDUCATION NEEDS:   Not appropriate for  education at this time  Skin:  Skin Assessment: Reviewed RN Assessment  Last BM:  10/1 - type 7  Height:   Ht Readings from Last 1 Encounters:  02/02/23 5' 2.99" (1.6 m)    Weight:   Wt Readings from Last 1 Encounters:  02/04/23 70.8 kg    Ideal Body Weight:  52.3 kg  BMI:  Body mass index is 27.66 kg/m.  Estimated Nutritional Needs:   Kcal:  1700-1900 kcal/d  Protein:  85-100 g/d  Fluid:  >1.8L/d    Jamelle Haring RDN, LDN Clinical Dietitian  RDN pager # available on Amion

## 2023-02-04 NOTE — Plan of Care (Signed)
Problem: Education: Goal: Knowledge of General Education information will improve Description: Including pain rating scale, medication(s)/side effects and non-pharmacologic comfort measures Outcome: Progressing   Problem: Health Behavior/Discharge Planning: Goal: Ability to manage health-related needs will improve Outcome: Progressing   Problem: Clinical Measurements: Goal: Ability to maintain clinical measurements within normal limits will improve Outcome: Progressing Goal: Will remain free from infection Outcome: Progressing Goal: Diagnostic test results will improve Outcome: Progressing Goal: Respiratory complications will improve Outcome: Progressing Goal: Cardiovascular complication will be avoided Outcome: Progressing   Problem: Activity: Goal: Risk for activity intolerance will decrease Outcome: Progressing   Problem: Nutrition: Goal: Adequate nutrition will be maintained Outcome: Progressing   Problem: Coping: Goal: Level of anxiety will decrease Outcome: Progressing   Problem: Elimination: Goal: Will not experience complications related to bowel motility Outcome: Progressing Goal: Will not experience complications related to urinary retention Outcome: Progressing   Problem: Pain Managment: Goal: General experience of comfort will improve Outcome: Progressing   Problem: Safety: Goal: Ability to remain free from injury will improve Outcome: Progressing   Problem: Skin Integrity: Goal: Risk for impaired skin integrity will decrease Outcome: Progressing   Problem: Safety: Goal: Non-violent Restraint(s) Outcome: Progressing   Problem: Education: Goal: Knowledge of General Education information will improve Description: Including pain rating scale, medication(s)/side effects and non-pharmacologic comfort measures Outcome: Progressing   Problem: Health Behavior/Discharge Planning: Goal: Ability to manage health-related needs will improve Outcome:  Progressing   Problem: Clinical Measurements: Goal: Ability to maintain clinical measurements within normal limits will improve Outcome: Progressing   Problem: Clinical Measurements: Goal: Ability to maintain clinical measurements within normal limits will improve Outcome: Progressing   Problem: Clinical Measurements: Goal: Will remain free from infection Outcome: Progressing   Problem: Clinical Measurements: Goal: Will remain free from infection Outcome: Progressing   Problem: Clinical Measurements: Goal: Diagnostic test results will improve Outcome: Progressing   Problem: Clinical Measurements: Goal: Diagnostic test results will improve Outcome: Progressing   Problem: Clinical Measurements: Goal: Respiratory complications will improve Outcome: Progressing   Problem: Clinical Measurements: Goal: Respiratory complications will improve Outcome: Progressing   Problem: Clinical Measurements: Goal: Cardiovascular complication will be avoided Outcome: Progressing   Problem: Activity: Goal: Risk for activity intolerance will decrease Outcome: Progressing   Problem: Activity: Goal: Risk for activity intolerance will decrease Outcome: Progressing   Problem: Nutrition: Goal: Adequate nutrition will be maintained Outcome: Progressing   Problem: Nutrition: Goal: Adequate nutrition will be maintained Outcome: Progressing   Problem: Coping: Goal: Level of anxiety will decrease Outcome: Progressing   Problem: Coping: Goal: Level of anxiety will decrease Outcome: Progressing   Problem: Elimination: Goal: Will not experience complications related to bowel motility Outcome: Progressing   Problem: Elimination: Goal: Will not experience complications related to bowel motility Outcome: Progressing   Problem: Elimination: Goal: Will not experience complications related to urinary retention Outcome: Progressing   Problem: Elimination: Goal: Will not experience  complications related to urinary retention Outcome: Progressing   Problem: Pain Managment: Goal: General experience of comfort will improve Outcome: Progressing   Problem: Pain Managment: Goal: General experience of comfort will improve Outcome: Progressing   Problem: Safety: Goal: Ability to remain free from injury will improve Outcome: Progressing   Problem: Safety: Goal: Ability to remain free from injury will improve Outcome: Progressing   Problem: Skin Integrity: Goal: Risk for impaired skin integrity will decrease Outcome: Progressing   Problem: Skin Integrity: Goal: Risk for impaired skin integrity will decrease Outcome: Progressing   Problem: Safety: Goal: Non-violent  Restraint(s) Outcome: Progressing   Problem: Safety: Goal: Non-violent Restraint(s) Outcome: Progressing

## 2023-02-04 NOTE — Progress Notes (Addendum)
Attending physician's note   I have taken a history, reviewed the chart, and examined the patient. I performed a substantive portion of this encounter, including complete performance of at least one of the key components, in conjunction with the APP. I agree with the APP's note, impression, and recommendations with my edits.   Appears overall better today.  More conversive and alert.  Was seen by SLP earlier and ok to start p.o. intake.  She would like to try some liquids.  WBC stable at 18.6.  Otherwise stable H/H.  - Continue vancomycin - Continue de-escalating other antimicrobial therapy tomorrow - Per SLP, okay to trial full liquids through the weekend.  Will still need TF in conjunction to maintain caloric intake   Doristine Locks, DO, FACG [phone] office         Daily Progress Note  DOA: 01/27/2023 Hospital Day: 9  Chief Complaint: Crohn's  ASSESSMENT    Brief Narrative:  [name] is a 52 y.o. year old female with a history of Crohn's disease on Skyrizi with rectal stricture, recurrent C dif infection, seizure disorder, COPD, DM, and HTN . Admitted after being found unresponsiveness and possible coffee ground emesis. Had cardiac arrest. Intubated and admitted to ICU. GI saw in consult 9/26  Shock ( septic vrs hypovolemic) / cardiac arrest / acute respiratory failure requiring intubation.  If septic, ? Etiology. Blood Cx positive for staphylococcus Hominis ( ? Contaminant). Sepsis possibly secondary to infectious colitis ( suspected C-diff despite negative toxin. .   Crohn's colitis complicated by colonic stricture on Skyrizi. Followed by [facility]. GI path negative. C-diff toxin negative ( Antigen positive). Flexible sigmoidoscopy yesterday with findings of diffuse severe mucosal changes in the rectum, in the recto-sigmoid colon and in the distal sigmoid colon consistent with Crohn's disease and likely consistent with infectious colitis. Started on  Vancomycin for suspected C-diff based on flex sig findings.   Previous coffee ground emesis. Resolved   PLAN   -- Continue oral vancomycin - Continue broad-spectrum ABX for now - Still hesitant to initiate IV steroids given infectious concerns  Subjective   No complaints   Objective    Recent Labs    02/02/23 0413 02/03/23 0428 02/04/23 0537  WBC 16.8* 18.6* 18.6*  HGB 10.9* 10.5* 10.7*  HCT 33.4* 33.0* 33.1*  PLT 207 294 376   BMET Recent Labs    02/02/23 0413 02/03/23 0428 02/04/23 0537  NA 143 140 137  K 4.0 3.7 4.2  CL 110 109 107  CO2 27 23 19*  GLUCOSE 125* 179* 200*  BUN 8 8 11   CREATININE 0.53 0.62 0.54  CALCIUM 7.5* 7.2* 7.3*   LFT Recent Labs    02/04/23 0537  PROT 4.3*  ALBUMIN <1.5*  AST 22  ALT 19  ALKPHOS 123  BILITOT 0.5   PT/INR No results for input(s): "LABPROT", "INR" in the last 72 hours.   Imaging:  DG Abd Portable 1V CLINICAL DATA:  Feeding tube placement.  EXAM: PORTABLE ABDOMEN - 1 VIEW  COMPARISON:  01/27/2023 and abdomen and pelvis CT obtained today.  FINDINGS: Feeding tube tip in the region of the distal gastric antrum or pylorus. Paucity of intestinal gas. Cholecystectomy clips. Unremarkable bones.  IMPRESSION: Feeding tube tip in the region of the distal gastric antrum or pylorus.  Electronically Signed   By: Beckie Salts M.D.   On: 01/31/2023 16:52 CT ABDOMEN PELVIS W CONTRAST CLINICAL DATA:  Acute right lower quadrant abdominal  pain.  Sepsis.  EXAM: CT ABDOMEN AND PELVIS WITH CONTRAST  TECHNIQUE: Multidetector CT imaging of the abdomen and pelvis was performed using the standard protocol following bolus administration of intravenous contrast.  RADIATION DOSE REDUCTION: This exam was performed according to the departmental dose-optimization program which includes automated exposure control, adjustment of the mA and/or kV according to patient size and/or use of iterative reconstruction  technique.  CONTRAST:  75mL OMNIPAQUE IOHEXOL 350 MG/ML SOLN  COMPARISON:  01/27/2023.  FINDINGS: Lower chest: Stable small pericardial effusion measuring 1.2 cm in maximum thickness. Increased bibasilar dependent atelectasis, left greater than right.  Hepatobiliary: Diffuse low density of the liver. Cholecystectomy clips.  Pancreas: Moderate diffuse pancreatic atrophy.  Spleen: Heterogeneous perfusion of the spleen, persisting on delayed images.  Adrenals/Urinary Tract: Foley catheter in the urinary bladder with associated air in the bladder and small amount of urine. Unremarkable adrenal glands, kidneys and ureters.  Stomach/Bowel: Nasogastric tube in place with its tip in the mid stomach. Mild and moderate diffuse colonic and rectal wall thickening with mucosal enhancement and mild pericolonic edema. Multiple dilated small bowel loops with mild improvement. Mild low-density wall thickening involving multiple distal small bowel loops. No pneumatosis. The appendix is surgically absent.  Vascular/Lymphatic: No significant vascular findings are present. No enlarged abdominal or pelvic lymph nodes.  Reproductive: Status post hysterectomy. No adnexal masses.  Other: Small amount of free peritoneal fluid. This includes a small amount of fluid in the inferior left pelvis with thin surrounding rim enhancement. Interval mild-to-moderate bilateral subcutaneous edema.  Musculoskeletal: Mild lumbar and minimal lower thoracic spine degenerative changes.  IMPRESSION: 1. Mild and moderate diffuse colitis and proctitis. 2. Mild distal small bowel enteritis. The proctocolitis and enteritis could be infectious or inflammatory in nature. 3. Mild improvement in small bowel dilatation compatible with mildly improved ileus. 4. Small amount of free peritoneal fluid. This includes a small amount of fluid in the inferior left pelvis with thin surrounding rim enhancement. This could  represent a developing abscess associated with peritonitis. 5. Heterogeneous perfusion of the spleen, persisting on delayed images. This is nonspecific and could be due to splenic hypoperfusion or splenic venous congestion. 6. Stable small pericardial effusion. 7. Increased bibasilar dependent atelectasis, left greater than right. 8. Diffuse hepatic steatosis. 9. Interval mild-to-moderate bilateral subcutaneous edema.  Electronically Signed   By: Beckie Salts M.D.   On: 01/31/2023 16:51     Scheduled inpatient medications:   sodium chloride   Intravenous Once   Chlorhexidine Gluconate Cloth  6 each Topical Daily   feeding supplement (PROSource TF20)  60 mL Per Tube Daily   fiber supplement (BANATROL TF)  60 mL Per Tube BID   Gerhardt's butt cream   Topical BID   heparin injection (subcutaneous)  5,000 Units Subcutaneous Q8H   insulin aspart  0-9 Units Subcutaneous Q4H   levETIRAcetam  750 mg Per Tube BID   metoprolol tartrate  12.5 mg Oral BID   metroNIDAZOLE  500 mg Per Tube Q12H   pantoprazole (PROTONIX) IV  40 mg Intravenous Q12H   thiamine  100 mg Per Tube Daily   vancomycin  125 mg Per Tube Q6H   Continuous inpatient infusions:   sodium chloride Stopped (02/02/23 0725)   cefTRIAXone (ROCEPHIN)  IV 2 g (02/04/23 1318)   feeding supplement (OSMOLITE 1.2 CAL) 1,000 mL (02/03/23 1322)   PRN inpatient medications: sodium chloride, acetaminophen, albuterol, magic mouthwash, mouth rinse, mouth rinse  Vital signs in last 24 hours: Temp:  [  97.7 F (36.5 C)-98.9 F (37.2 C)] 98.9 F (37.2 C) (10/04 1200) Pulse Rate:  [124-132] 132 (10/04 1200) Resp:  [20-31] 22 (10/04 1200) BP: (109-133)/(58-71) 123/58 (10/04 1200) SpO2:  [97 %-100 %] 100 % (10/04 1200) Weight:  [70.8 kg] 70.8 kg (10/04 0500) Last BM Date : 02/03/23  Intake/Output Summary (Last 24 hours) at 02/04/2023 1522 Last data filed at 02/04/2023 0700 Gross per 24 hour  Intake --  Output 800 ml  Net -800 ml     Intake/Output from previous day: 10/03 0701 - 10/04 0700 In: 289.5 [I.V.:50; NG/GT:200; IV Piggyback:39.5] Out: 1200 [Urine:1200] Intake/Output this shift: No intake/output data recorded.   Physical Exam:  General: Alert female in NAD Heart:  Regular rate and rhythm.  Pulmonary: Normal respiratory effort Abdomen: Mild generalized TTP.  No peritoneal signs.    Principal Problem:   Cardiac arrest [facility]) Active Problems:   Shock (HCC)   Acute respiratory failure with hypoxia (HCC)   Upper GI bleed   Colitis   Pseudomembranous colitis   Crohn's disease without complication (HCC)     LOS: 8 days   Willette Cluster ,NP 02/04/2023, 3:22 PM

## 2023-02-04 NOTE — TOC Progression Note (Signed)
Transition of Care [facility]) - Progression Note    Patient Details  Name: [name] MRN: [id number] Date of Birth: [birth year]  Transition of Care [facility]) CM/SW Contact  Mearl Latin, LCSW Phone Number: 02/04/2023, 5:01 PM  Clinical Narrative:    Patient transferred from ICU. CSW will continue to follow for medical progression and needs.        Expected Discharge Plan and Services                                               Social Determinants of Health (SDOH) Interventions SDOH Screenings   Food Insecurity: Food Insecurity Present (10/18/2022)   Received from [facility]  Housing: Low Risk  (01/16/2022)  Transportation Needs: Unmet Transportation Needs (10/18/2022)   Received from [facility]  Utilities: Not At Risk (10/18/2022)   Received from [facility]  Financial Resource Strain: Medium Risk (10/18/2022)   Received from [facility]  Social Connections: Unknown (09/01/2021)   Received from [facility], [facility]  Stress: Stress Concern Present (06/25/2020)   Received from [facility], [facility]  Tobacco Use: High Risk (02/02/2023)    Readmission Risk Interventions     No data to display

## 2023-02-04 NOTE — Progress Notes (Signed)
PROGRESS NOTE    [name]  ZOX:[id number] DOB: 18-Jan-1971 DOA: 01/27/2023 PCP: [name], PA-C    Chief Complaint  Patient presents with   Respiratory Arrest    Brief Narrative:   This is a 52 year old female with past medical history of COPD, Crohn's on Skyrizi, hypertension, type 2 diabetes mellitus who initially presented to the after being found unresponsive. Per family, patient has had bloody emesis with diarrhea over the past few days. Initial systolics in the 60s in the ED, and patient was minimally responsive. In the emergency room, patient was intubated, and patient was admitted to the ICU for further evaluation and management for shock and altered mental status. She had concern for ongoing colitis. Patient was taken for flexible sigmoidoscopy by GI yesterday. This revealed mucosal erosions consistent with her history of Crohn's. They started her on vancomycin with her history of C. difficile and decided to hold off on steroids.  -Improved and transferred to [facility] service-progressive care 10//2024.  Assessment & Plan:   Principal Problem:   Cardiac arrest [facility]) Active Problems:   Shock (HCC)   Acute respiratory failure with hypoxia (HCC)   Upper GI bleed   Colitis   Pseudomembranous colitis   Crohn's disease without complication (HCC)       Cardiac arrest -Required intubation on presentation, he is successfully extubated, appears to be improving   Infectious colitis/membranous colitis due to C. Difficile - flexible sigmoidoscopy on 10/2 with severe colitis with pseudomembranous features. -Despite negative C. difficile toxin, but positive antigen, but clinical sensations and findings on sigmoidoscopy, concerning for C. difficile colitis, started on p.o. vancomycin -Continue with Flagyl and Ceftriaxone day 9  of 10 -Vancomycin day 3 of 14 -GI following, appreciate recommendations   Type 2 diabetes mellitus.  -Continue with insulin sliding scale for now    History of seizures Still on Keppra.  No concern for seizure-like activity.  Doing well. -Continue Keppra   Anxiety Given altered mental status, will continue to hold Xanax.   Hypertension -Blood pressure started to increase, will resume low-dose metoprolol (she is on metoprolol tartrate 25 mg twice daily at home)  Sinus tachycardia -Multi factorial, due to acute illness, deconditioning, as well likely from holding metoprolol since admission,, continue to monitor until he, will start on low-dose metoprolol  Encephalopathy Dysphagia -Patient remains with slurred speech, mild confusion, but mentation appears to continue to improve, most likely in the setting of septic shock, postcardiac arrest, will consult SLP regarding cognition/speech evaluation -SLP following closely    COPD No concern for exacerbation at this time.  Will continue with as needed albuterol.   Nutrition PCM -She remains on tube feed -Albumin significantly low, will consult nutrition  DVT prophylaxis: Heparin Code Status: Full code Family communication: None at bedside Disposition:   Status is: Inpatient    Consultants:  PCCM Gastroenterology  Subjective:  Significant events overnight as discussed with staff, patient herself denies any complaints, reports generalized weakness, fatigue, she still having diarrhea with Flexi-Seal present.  Objective: Vitals:   02/04/23 0400 02/04/23 0500 02/04/23 0630 02/04/23 0800  BP: 117/67   133/71  Pulse: (!) 124   (!) 126  Resp: (!) 30  (!) 22 20  Temp: 98.1 F (36.7 C)   97.8 F (36.6 C)  TempSrc: Axillary   Oral  SpO2:    97%  Weight:  70.8 kg    Height:        Intake/Output Summary (Last 24 hours) at 02/04/2023 1130 Last  data filed at 02/04/2023 0700 Gross per 24 hour  Intake --  Output 800 ml  Net -800 ml   Filed Weights   02/02/23 1310 02/03/23 0306 02/04/23 0500  Weight: 75.4 kg 75.2 kg 70.8 kg    Examination:  Awake Alert, frail,  deconditioned, NG tube present Symmetrical Chest wall movement, diminished at the chest with some Rales, otherwise no respiratory distress RRR,No Gallops,Rubs or new Murmurs, No Parasternal Heave +ve B.Sounds, Abd Soft, No tenderness, No rebound - guarding or rigidity. No Cyanosis, Clubbing, she has mild anasarca.     Data Reviewed: I have personally reviewed following labs and imaging studies  CBC: Recent Labs  Lab 01/31/23 0534 02/01/23 0445 02/02/23 0413 02/03/23 0428 02/04/23 0537  WBC 13.4* 15.5* 16.8* 18.6* 18.6*  HGB 11.3* 12.1 10.9* 10.5* 10.7*  HCT 34.4* 37.1 33.4* 33.0* 33.1*  MCV 87.3 85.9 86.8 85.7 85.5  PLT 148* 178 207 294 376    Basic Metabolic Panel: Recent Labs  Lab 01/30/23 1715 01/31/23 0534 01/31/23 1631 02/01/23 0445 02/02/23 0413 02/03/23 0428 02/04/23 0537  NA  --  142  --  143 143 140 137  K  --  4.5  --  3.8 4.0 3.7 4.2  CL  --  109  --  109 110 109 107  CO2  --  25  --  25 27 23  19*  GLUCOSE  --  135*  --  160* 125* 179* 200*  BUN  --  14  --  10 8 8 11   CREATININE  --  1.03*  --  0.60 0.53 0.62 0.54  CALCIUM  --  8.0*  --  7.6* 7.5* 7.2* 7.3*  MG 2.4 2.2 2.1 2.0 2.0 1.7  --   PHOS 1.7* <1.0* 2.4* 2.4* 2.9  --   --     GFR: Estimated Creatinine Clearance: 76.8 mL/min (by C-G formula based on SCr of 0.54 mg/dL).  Liver Function Tests: Recent Labs  Lab 01/30/23 0332 01/31/23 0534 02/01/23 0445 02/03/23 0428 02/04/23 0537  AST 82* 51* 39 26 22  ALT 45* 37 34 25 19  ALKPHOS 152* 166* 159* 139* 123  BILITOT 1.4* 0.8 0.5 0.3 0.5  PROT 4.2* 4.4* 4.4* 4.1* 4.3*  ALBUMIN <1.5* <1.5* <1.5* <1.5* <1.5*    CBG: Recent Labs  Lab 02/03/23 2013 02/03/23 2353 02/04/23 0432 02/04/23 0821 02/04/23 1010  GLUCAP 133* 185* 210* 168* 210*     Recent Results (from the past 240 hour(s))  Blood culture (routine x 2)     Status: Abnormal   Collection Time: 01/27/23  6:15 AM   Specimen: BLOOD  Result Value Ref Range Status   Specimen  Description   Final    BLOOD CENT CENTRAL LINE Performed at [facility], [address] [place], [place] [address]    Special Requests   Final    BOTTLES DRAWN AEROBIC AND ANAEROBIC Blood Culture adequate volume Performed at [facility], [address] [place], [place] [address]    Culture  Setup Time   Final    GRAM POSITIVE COCCI ANAEROBIC BOTTLE ONLY CRITICAL RESULT CALLED TO, READ BACK BY AND VERIFIED WITH: SAMANTHA IZQUIERDO AT 0981 01/28/2023 BY T KENNEDY CRITICAL RESULT CALLED TO, READ BACK BY AND VERIFIED WITH: PHARMD Hillery Aldo [id number] AT 1011 BY CM    Culture (A)  Final    STAPHYLOCOCCUS HOMINIS THE SIGNIFICANCE OF ISOLATING THIS ORGANISM FROM A SINGLE SET OF BLOOD CULTURES WHEN MULTIPLE SETS ARE DRAWN IS  UNCERTAIN. PLEASE NOTIFY THE MICROBIOLOGY DEPARTMENT WITHIN ONE WEEK IF SPECIATION AND SENSITIVITIES ARE REQUIRED. Performed at [facility], [address], [place], [place] [address]    Report Status 02/01/2023 FINAL  Final  Blood culture (routine x 2)     Status: None   Collection Time: 01/27/23  6:15 AM   Specimen: BLOOD  Result Value Ref Range Status   Specimen Description BLOOD BLOOD LEFT HAND  Final   Special Requests   Final    BOTTLES DRAWN AEROBIC ONLY Blood Culture results may not be optimal due to an inadequate volume of blood received in culture bottles   Culture   Final    NO GROWTH 5 DAYS Performed at [facility], [address], [place], [place] [address]    Report Status 02/01/2023 FINAL  Final  Blood Culture ID Panel (Reflexed)     Status: Abnormal   Collection Time: 01/27/23  6:15 AM  Result Value Ref Range Status   Enterococcus faecalis NOT DETECTED NOT DETECTED Final   Enterococcus Faecium NOT DETECTED NOT DETECTED Final   Listeria monocytogenes NOT DETECTED NOT DETECTED Final   Staphylococcus species DETECTED (A) NOT DETECTED Final    Comment: CRITICAL RESULT CALLED TO, READ BACK BY AND VERIFIED WITH: Marita Snellen [id number] AT 1012 BY  CM    Staphylococcus aureus (BCID) NOT DETECTED NOT DETECTED Final   Staphylococcus epidermidis NOT DETECTED NOT DETECTED Final   Staphylococcus lugdunensis NOT DETECTED NOT DETECTED Final   Streptococcus species NOT DETECTED NOT DETECTED Final   Streptococcus agalactiae NOT DETECTED NOT DETECTED Final   Streptococcus pneumoniae NOT DETECTED NOT DETECTED Final   Streptococcus pyogenes NOT DETECTED NOT DETECTED Final   A.calcoaceticus-baumannii NOT DETECTED NOT DETECTED Final   Bacteroides fragilis NOT DETECTED NOT DETECTED Final   Enterobacterales NOT DETECTED NOT DETECTED Final   Enterobacter cloacae complex NOT DETECTED NOT DETECTED Final   Escherichia coli NOT DETECTED NOT DETECTED Final   Klebsiella aerogenes NOT DETECTED NOT DETECTED Final   Klebsiella oxytoca NOT DETECTED NOT DETECTED Final   Klebsiella pneumoniae NOT DETECTED NOT DETECTED Final   Proteus species NOT DETECTED NOT DETECTED Final   Salmonella species NOT DETECTED NOT DETECTED Final   Serratia marcescens NOT DETECTED NOT DETECTED Final   Haemophilus influenzae NOT DETECTED NOT DETECTED Final   Neisseria meningitidis NOT DETECTED NOT DETECTED Final   Pseudomonas aeruginosa NOT DETECTED NOT DETECTED Final   Stenotrophomonas maltophilia NOT DETECTED NOT DETECTED Final   Candida albicans NOT DETECTED NOT DETECTED Final   Candida auris NOT DETECTED NOT DETECTED Final   Candida glabrata NOT DETECTED NOT DETECTED Final   Candida krusei NOT DETECTED NOT DETECTED Final   Candida parapsilosis NOT DETECTED NOT DETECTED Final   Candida tropicalis NOT DETECTED NOT DETECTED Final   Cryptococcus neoformans/gattii NOT DETECTED NOT DETECTED Final    Comment: Performed at [facility], [address] [place], [place] [address]  Resp panel by RT-PCR (RSV, Flu A&B, Covid) Anterior Nasal Swab     Status: None   Collection Time: 01/27/23  9:23 AM   Specimen: Anterior Nasal Swab  Result Value Ref Range Status   SARS  Coronavirus 2 by RT PCR NEGATIVE NEGATIVE Final    Comment: (NOTE) SARS-CoV-2 target nucleic acids are NOT DETECTED.  The SARS-CoV-2 RNA is generally detectable in upper respiratory specimens during the acute phase of infection. The lowest concentration of SARS-CoV-2 viral copies this assay can detect is 138 copies/mL. A  negative result does not preclude SARS-Cov-2 infection and should not be used as the sole basis for treatment or other patient management decisions. A negative result may occur with  improper specimen collection/handling, submission of specimen other than nasopharyngeal swab, presence of viral mutation(s) within the areas targeted by this assay, and inadequate number of viral copies(<138 copies/mL). A negative result must be combined with clinical observations, patient history, and epidemiological information. The expected result is Negative.  Fact Sheet for Patients:  BloggerCourse.com  Fact Sheet for Healthcare Providers:  SeriousBroker.it  This test is no t yet approved or cleared by the [place] FDA and  has been authorized for detection and/or diagnosis of SARS-CoV-2 by FDA under an Emergency Use Authorization (EUA). This EUA will remain  in effect (meaning this test can be used) for the duration of the COVID-19 declaration under Section 564(b)(1) of the Act, 21 U.S.C.section 360bbb-3(b)(1), unless the authorization is terminated  or revoked sooner.       Influenza A by PCR NEGATIVE NEGATIVE Final   Influenza B by PCR NEGATIVE NEGATIVE Final    Comment: (NOTE) The Xpert Xpress SARS-CoV-2/FLU/RSV plus assay is intended as an aid in the diagnosis of influenza from Nasopharyngeal swab specimens and should not be used as a sole basis for treatment. Nasal washings and aspirates are unacceptable for Xpert Xpress SARS-CoV-2/FLU/RSV testing.  Fact Sheet for  Patients: BloggerCourse.com  Fact Sheet for Healthcare Providers: SeriousBroker.it  This test is not yet approved or cleared by the [place] FDA and has been authorized for detection and/or diagnosis of SARS-CoV-2 by FDA under an Emergency Use Authorization (EUA). This EUA will remain in effect (meaning this test can be used) for the duration of the COVID-19 declaration under Section 564(b)(1) of the Act, 21 U.S.C. section 360bbb-3(b)(1), unless the authorization is terminated or revoked.     Resp Syncytial Virus by PCR NEGATIVE NEGATIVE Final    Comment: (NOTE) Fact Sheet for Patients: BloggerCourse.com  Fact Sheet for Healthcare Providers: SeriousBroker.it  This test is not yet approved or cleared by the [place] FDA and has been authorized for detection and/or diagnosis of SARS-CoV-2 by FDA under an Emergency Use Authorization (EUA). This EUA will remain in effect (meaning this test can be used) for the duration of the COVID-19 declaration under Section 564(b)(1) of the Act, 21 U.S.C. section 360bbb-3(b)(1), unless the authorization is terminated or revoked.  Performed at [facility], [address], [place], [place] [address]   MRSA Next Gen by PCR, Nasal     Status: None   Collection Time: 01/27/23 10:53 AM   Specimen: Nasal Mucosa; Nasal Swab  Result Value Ref Range Status   MRSA by PCR Next Gen NOT DETECTED NOT DETECTED Final    Comment: (NOTE) The GeneXpert MRSA Assay (FDA approved for NASAL specimens only), is one component of a comprehensive MRSA colonization surveillance program. It is not intended to diagnose MRSA infection nor to guide or monitor treatment for MRSA infections. Test performance is not FDA approved in patients less than 42 years old. Performed at [facility], [address], [place], [place] [address]   Calprotectin, Fecal      Status: Abnormal   Collection Time: 01/30/23  3:31 AM   Specimen: Stool  Result Value Ref Range Status   Calprotectin, Fecal 562 (H) 0 - 120 ug/g Final    Comment: (NOTE) Concentration     Interpretation   Follow-Up < 5 - 50 ug/g  Normal           None >50 -120 ug/g     Borderline       Re-evaluate in 4-6 weeks    >120 ug/g     Abnormal         Repeat as clinically                                   indicated Performed At: [facility] [address] [place], [place] [id number] Jolene Schimke MD VQ:[id number]   Gastrointestinal Panel by PCR , Stool     Status: None   Collection Time: 01/30/23  3:31 AM   Specimen: Stool  Result Value Ref Range Status   Campylobacter species NOT DETECTED NOT DETECTED Final   Plesimonas shigelloides NOT DETECTED NOT DETECTED Final   Salmonella species NOT DETECTED NOT DETECTED Final   Yersinia enterocolitica NOT DETECTED NOT DETECTED Final   Vibrio species NOT DETECTED NOT DETECTED Final   Vibrio cholerae NOT DETECTED NOT DETECTED Final   Enteroaggregative E coli (EAEC) NOT DETECTED NOT DETECTED Final   Enteropathogenic E coli (EPEC) NOT DETECTED NOT DETECTED Final   Enterotoxigenic E coli (ETEC) NOT DETECTED NOT DETECTED Final   Shiga like toxin producing E coli (STEC) NOT DETECTED NOT DETECTED Final   Shigella/Enteroinvasive E coli (EIEC) NOT DETECTED NOT DETECTED Final   Cryptosporidium NOT DETECTED NOT DETECTED Final   Cyclospora cayetanensis NOT DETECTED NOT DETECTED Final   Entamoeba histolytica NOT DETECTED NOT DETECTED Final   Giardia lamblia NOT DETECTED NOT DETECTED Final   Adenovirus F40/41 NOT DETECTED NOT DETECTED Final   Astrovirus NOT DETECTED NOT DETECTED Final   Norovirus GI/GII NOT DETECTED NOT DETECTED Final   Rotavirus A NOT DETECTED NOT DETECTED Final   Sapovirus (I, II, IV, and V) NOT DETECTED NOT DETECTED Final    Comment: Performed at [facility] [address] [place], [place] [address]  C Difficile  Quick Screen w PCR reflex     Status: Abnormal   Collection Time: 01/30/23  4:34 AM   Specimen: Stool  Result Value Ref Range Status   C Diff antigen POSITIVE (A) NEGATIVE Final   C Diff toxin NEGATIVE NEGATIVE Final   C Diff interpretation Results are indeterminate. See PCR results.  Final    Comment: Performed at [facility], [address], [place], [place] [address]  C. Diff by PCR, Reflexed     Status: Abnormal   Collection Time: 01/30/23  4:34 AM  Result Value Ref Range Status   Toxigenic C. Difficile by PCR POSITIVE (A) NEGATIVE Final    Comment: Positive for toxigenic C. difficile with little to no toxin production. Only treat if clinical presentation suggests symptomatic illness. Performed at [facility], [address], [place], [place] [address]          Radiology Studies: No results found.      Scheduled Meds:  sodium chloride   Intravenous Once   Chlorhexidine Gluconate Cloth  6 each Topical Daily   feeding supplement (PROSource TF20)  60 mL Per Tube Daily   fiber supplement (BANATROL TF)  60 mL Per Tube BID   Gerhardt's butt cream   Topical BID   heparin injection (subcutaneous)  5,000 Units Subcutaneous Q8H   insulin aspart  0-9 Units Subcutaneous Q4H   levETIRAcetam  750 mg Per Tube BID   metroNIDAZOLE  500 mg Per Tube Q12H   pantoprazole (PROTONIX) IV  40 mg Intravenous Q12H   thiamine  100 mg Per Tube Daily   vancomycin  125 mg Per Tube Q6H   Continuous Infusions:  sodium chloride Stopped (02/02/23 0725)   cefTRIAXone (ROCEPHIN)  IV 2 g (02/03/23 1432)   feeding supplement (OSMOLITE 1.2 CAL) 1,000 mL (02/03/23 1322)     LOS: 8 days      Huey Bienenstock, MD [facility]   To contact the attending provider between 7A-7P or the covering provider during after hours 7P-7A, please log into the web site [email or web address] and access using universal [place] password for that web site. If you do not have the password, please call  the hospital operator.  02/04/2023, 11:30 AM

## 2023-02-04 NOTE — Progress Notes (Addendum)
Speech Language Pathology Treatment: Dysphagia  Patient Details Name: [name] MRN: [id number] DOB: Feb 11, 1971 Today's Date: 02/04/2023 Time: 4098-1191 SLP Time Calculation (min) (ACUTE ONLY): 29 min  Assessment / Plan / Recommendation Clinical Impression  Pt with improving MS today.  Daughter, Hospital doctor, at bedside. Oral suctioning was set up and pt engaged in her own oral care with toothbrush and [place]auer.  She accepted sips of water and drank 4 oz without any s/s of aspiration. She was reluctant to eat applesauce or pudding - according to [facility], she is averse to these textures. However, eventually she had a single bite of pudding after encouragement. There were no obvious difficulties manipulating material.   Now that mental status has improved,  she is safe to resume some POs. Recommend full liquids through the weekend; d/w Dr. Randol Kern. Pt will still need TF in conjunction with PO diet.  She may have oral meds if easier than administering through cortrak (whole with puree).  SLP will follow. Informed RD.   HPI HPI: 52 yo female admitted 9/26 after found down for an unspecified amount of time with possible GIB, agonal breathing and pulseless with CPR 5 min. Intubated in [facility] ED and transferred to [facility], [facility], [facility]. Pt with AKI, elevated lactate, septic shock. 10/2 extubated and EGD. PMhx: COPD, chron's, HTN, DM, PTSD, seizure disorder, chronic back pain       SLP Plan  Continue with current plan of care      Recommendations for follow up therapy are one component of a multi-disciplinary discharge planning process, led by the attending physician.  Recommendations may be updated based on patient status, additional functional criteria and insurance authorization.    Recommendations  Diet recommendations:  (full liquids) Liquids provided via: Cup;Straw Medication Administration: Via alternative means Supervision: Staff to assist with self feeding Compensations: Minimize environmental  distractions Postural Changes and/or Swallow Maneuvers: Seated upright 90 degrees                  Oral care BID     Dysphagia, unspecified (R13.10)     Continue with current plan of care   Rogen Porte L. Samson Frederic, MA CCC/SLP Clinical Specialist - Acute Care SLP Acute Rehabilitation Services Office number [phone]   Blenda Mounts Laurice  02/04/2023, 11:26 AM

## 2023-02-05 DIAGNOSIS — K50118 Crohn's disease of large intestine with other complication: Secondary | ICD-10-CM | POA: Diagnosis not present

## 2023-02-05 DIAGNOSIS — R579 Shock, unspecified: Secondary | ICD-10-CM | POA: Diagnosis not present

## 2023-02-05 DIAGNOSIS — K529 Noninfective gastroenteritis and colitis, unspecified: Secondary | ICD-10-CM | POA: Diagnosis not present

## 2023-02-05 DIAGNOSIS — J9601 Acute respiratory failure with hypoxia: Secondary | ICD-10-CM | POA: Diagnosis not present

## 2023-02-05 DIAGNOSIS — I469 Cardiac arrest, cause unspecified: Secondary | ICD-10-CM | POA: Diagnosis not present

## 2023-02-05 LAB — BASIC METABOLIC PANEL
Anion gap: 8 (ref 5–15)
BUN: 12 mg/dL (ref 6–20)
CO2: 22 mmol/L (ref 22–32)
Calcium: 7.8 mg/dL — ABNORMAL LOW (ref 8.9–10.3)
Chloride: 104 mmol/L (ref 98–111)
Creatinine, Ser: 0.57 mg/dL (ref 0.44–1.00)
GFR, Estimated: >60 mL/min (ref >60)
Glucose, Bld: 229 mg/dL — ABNORMAL HIGH (ref 70–99)
Potassium: 4.1 mmol/L (ref 3.5–5.1)
Sodium: 134 mmol/L — ABNORMAL LOW (ref 135–145)

## 2023-02-05 LAB — CBC
HCT: 37.1 % (ref 36.0–46.0)
Hemoglobin: 11.8 g/dL — ABNORMAL LOW (ref 12.0–15.0)
MCH: 27.9 pg (ref 26.0–34.0)
MCHC: 31.8 g/dL (ref 30.0–36.0)
MCV: 87.7 fL (ref 80.0–100.0)
Platelets: 482 10*3/uL — ABNORMAL HIGH (ref 150–400)
RBC: 4.23 MIL/uL (ref 3.87–5.11)
RDW: 15.9 % — ABNORMAL HIGH (ref 11.5–15.5)
WBC: 17.4 10*3/uL — ABNORMAL HIGH (ref 4.0–10.5)
nRBC: 0.2 % (ref 0.0–0.2)

## 2023-02-05 LAB — GLUCOSE, CAPILLARY
Glucose-Capillary: 144 mg/dL — ABNORMAL HIGH (ref 70–99)
Glucose-Capillary: 231 mg/dL — ABNORMAL HIGH (ref 70–99)
Glucose-Capillary: 249 mg/dL — ABNORMAL HIGH (ref 70–99)
Glucose-Capillary: 215 mg/dL — ABNORMAL HIGH (ref 70–99)
Glucose-Capillary: 144 mg/dL — ABNORMAL HIGH (ref 70–99)

## 2023-02-05 LAB — PHOSPHORUS: Phosphorus: 3 mg/dL (ref 2.5–4.6)

## 2023-02-05 LAB — MAGNESIUM: Magnesium: 2 mg/dL (ref 1.7–2.4)

## 2023-02-05 MED ORDER — ALBUMIN HUMAN 25 % IV SOLN
25.0000 g | Freq: Four times a day (QID) | INTRAVENOUS | Status: AC
  Administered 2023-02-05: 12.5 g via INTRAVENOUS
  Administered 2023-02-05: 25 g via INTRAVENOUS
  Administered 2023-02-05 – 2023-02-06 (×2): 12.5 g via INTRAVENOUS
  Administered 2023-02-06 (×3): 25 g via INTRAVENOUS
  Administered 2023-02-06: 12.5 g via INTRAVENOUS
  Filled 2023-02-05 (×6): qty 100

## 2023-02-05 MED ORDER — METOPROLOL TARTRATE 25 MG PO TABS
25.0000 mg | ORAL_TABLET | Freq: Once | ORAL | Status: AC
  Administered 2023-02-05: 25 mg via ORAL
  Filled 2023-02-05: qty 1

## 2023-02-05 MED ORDER — METOPROLOL TARTRATE 25 MG PO TABS
25.0000 mg | ORAL_TABLET | Freq: Two times a day (BID) | ORAL | Status: DC
  Administered 2023-02-05 – 2023-02-06 (×2): 25 mg via ORAL
  Filled 2023-02-05 (×2): qty 1

## 2023-02-05 MED ORDER — INSULIN GLARGINE-YFGN 100 UNIT/ML ~~LOC~~ SOLN
5.0000 [IU] | Freq: Two times a day (BID) | SUBCUTANEOUS | Status: DC
  Administered 2023-02-05 – 2023-02-12 (×12): 5 [IU] via SUBCUTANEOUS
  Filled 2023-02-05 (×16): qty 0.05

## 2023-02-05 NOTE — Progress Notes (Signed)
[name] GASTROENTEROLOGY ROUNDING NOTE   Subjective: Patient is alert, conversant and interactive.  Her speech is still difficult to understand, and she does seem somewhat confused.  She does know she is at [facility] and that she has bad Crohn's disease. She denies abdominal pain.  She reports no appetite.  Her tray is sitting in front of her untouched.   Objective: Vital signs in last 24 hours: Temp:  [97.3 F (36.3 C)-98.3 F (36.8 C)] 98.2 F (36.8 C) (10/05 1500) Pulse Rate:  [116-140] 129 (10/05 1500) Resp:  [15-34] 20 (10/05 1500) BP: (108-137)/(44-85) 109/65 (10/05 1500) SpO2:  [92 %-100 %] 100 % (10/05 1500) Weight:  [72.6 kg] 72.6 kg (10/05 0500) Last BM Date : 02/04/23 General: NAD, chronically ill Caucasian female, no family at bedside Lungs:  CTA b/l, no w/r/r Heart: Tachycardic, no m/r/g Abdomen: Distended, mild tenderness to palpation without rigidity or guarding +BS Ext:  No c/c/e    Intake/Output from previous day: 10/04 0701 - 10/05 0700 In: -  Out: 1550 [Urine:550; Stool:1000] Intake/Output this shift: No intake/output data recorded.   Lab Results: Recent Labs    02/03/23 0428 02/04/23 0537 02/05/23 1720  WBC 18.6* 18.6* 17.4*  HGB 10.5* 10.7* 11.8*  PLT 294 376 482*  MCV 85.7 85.5 87.7   BMET Recent Labs    02/03/23 0428 02/04/23 0537 02/05/23 1720  NA 140 137 134*  K 3.7 4.2 4.1  CL 109 107 104  CO2 23 19* 22  GLUCOSE 179* 200* 229*  BUN 8 11 12   CREATININE 0.62 0.54 0.57  CALCIUM 7.2* 7.3* 7.8*   LFT Recent Labs    02/03/23 0428 02/04/23 0537  PROT 4.1* 4.3*  ALBUMIN <1.5* <1.5*  AST 26 22  ALT 25 19  ALKPHOS 139* 123  BILITOT 0.3 0.5   PT/INR No results for input(s): "INR" in the last 72 hours.    Imaging/Other results: No results found.    Assessment and Plan:  Brief Narrative:  [name] is a 52 y.o. year old female with a history of Crohn's disease on Skyrizi with rectal stricture, recurrent  C dif infection, seizure disorder, COPD, DM, and HTN . Admitted after being found unresponsiveness and possible coffee ground emesis. Had cardiac arrest. Intubated and admitted to ICU. GI saw in consult 9/26   Shock ( septic vrs hypovolemic) / cardiac arrest / acute respiratory failure requiring intubation.  If septic, ? Etiology. Blood Cx positive for staphylococcus Hominis ( ? Contaminant). Sepsis possibly secondary to infectious colitis ( suspected C-diff despite negative toxin).  - Would continue to hold off on systemic steroids for now given unclear infectious source, and clinical improvement without steroids - Continue Flagyl   Crohn's colitis complicated by colonic stricture on Skyrizi. Followed by [facility]. GI path negative. C-diff toxin negative ( Antigen positive). Flexible sigmoidoscopy yesterday with findings of diffuse severe mucosal changes in the rectum, in the recto-sigmoid colon and in the distal sigmoid colon consistent with Crohn's disease and likely consistent with infectious colitis. Ischemic colitis also possible given her cardiac arrest.  Started on Vancomycin for suspected C-diff based on flex sig findings.   -Continue oral vancomycin for now -Hold steroids for now - Obtain and trend CRP   Previous coffee ground emesis. Resolved  Encephalopathy Likely secondary to cardiac arrest, acute illness/shock.  Expect this improve with continued improvement in infection/shock/colitis.  Malnutrition - Continue enteral feeding    Jenel Lucks, MD  02/05/2023, 6:18 PM [facility]

## 2023-02-05 NOTE — Progress Notes (Signed)
OT Cancellation Note  Patient Details Name: [name] MRN: [id number] DOB: 06/09/1970   Cancelled Treatment:    Reason Eval/Treat Not Completed: Other (comment) (Repeat OT evaluation order acknowledged, POC remains appropriate. Continue as already established.)  Donia Pounds 02/05/2023, 7:54 AM

## 2023-02-05 NOTE — Progress Notes (Signed)
PROGRESS NOTE    [name]  VOZ:[id number] DOB: 1971/03/14 DOA: 01/27/2023 PCP: [name], PA-C    Chief Complaint  Patient presents with   Respiratory Arrest    Brief Narrative:   This is a 52 year old female with past medical history of COPD, Crohn's on Skyrizi, hypertension, type 2 diabetes mellitus who initially presented to the after being found unresponsive. Per family, patient has had bloody emesis with diarrhea over the past few days. Initial systolics in the 60s in the ED, and patient was minimally responsive. In the emergency room, patient was intubated, and patient was admitted to the ICU for further evaluation and management for shock and altered mental status. She had concern for ongoing colitis. Patient was taken for flexible sigmoidoscopy by GI yesterday. This revealed mucosal erosions consistent with her history of Crohn's. They started her on vancomycin with her history of C. difficile and decided to hold off on steroids.  -Improved and transferred to [facility] service-progressive care 10//2024.  Assessment & Plan:   Principal Problem:   Cardiac arrest [facility]) Active Problems:   Shock (HCC)   Acute respiratory failure with hypoxia (HCC)   Upper GI bleed   Colitis   Pseudomembranous colitis   Crohn's disease without complication (HCC)       Cardiac arrest -Required intubation on presentation, She is successfully extubated, appears to be improving   Infectious colitis/membranous colitis due to C. Difficile - flexible sigmoidoscopy on 10/2 with severe colitis with pseudomembranous features. -Despite negative C. difficile toxin, but positive antigen, but clinical sensations and findings on sigmoidoscopy, concerning for C. difficile colitis, started on p.o. vancomycin -She finished total 10 days of IV Rocephin and Flagyl. -Vancomycin day 4 of 14 -GI following, appreciate recommendations   Type 2 diabetes mellitus.  -Continue with insulin sliding scale for  now -CBG remains significantly elevated, continue with insulin sliding scale but will add Semglee 5 units twice daily   History of seizures Still on Keppra.  No concern for seizure-like activity.  Doing well. -Continue Keppra   Anxiety Given altered mental status, will continue to hold Xanax.   Hypertension -Blood pressure started to increase, will resume low-dose metoprolol (she is on metoprolol tartrate 25 mg twice daily at home)  Sinus tachycardia -Multi factorial, due to acute illness, deconditioning, as well likely from holding metoprolol since admission,, continue to monitor until he, will start on low-dose metoprolol  Encephalopathy Dysphagia -Patient remains with slurred speech, mild confusion, but mentation appears to continue to improve, most likely in the setting of septic shock, postcardiac arrest, will consult SLP regarding cognition/speech evaluation -SLP following closely    COPD No concern for exacerbation at this time.  Will continue with as needed albuterol.   Nutrition PCM -She remains on tube feed -Albumin significantly low, will consult nutrition  DVT prophylaxis: Heparin Code Status: Full code Family communication: None at bedside, daughter will be updated today Disposition:   Status is: Inpatient    Consultants:  PCCM Gastroenterology  Subjective:  No significant events as discussed with staff  Objective: Vitals:   02/05/23 1100 02/05/23 1200 02/05/23 1400 02/05/23 1500  BP: (!) 123/57 110/72 120/74 109/65  Pulse: (!) 122 (!) 124 (!) 133 (!) 129  Resp: 18 (!) 30 (!) 29 20  Temp: 98.2 F (36.8 C) 98.2 F (36.8 C) 98.2 F (36.8 C) 98.2 F (36.8 C)  TempSrc: Oral Oral Oral Oral  SpO2: 96% 98% 99% 100%  Weight:      Height:  Intake/Output Summary (Last 24 hours) at 02/05/2023 1514 Last data filed at 02/05/2023 0500 Gross per 24 hour  Intake --  Output 1550 ml  Net -1550 ml   Filed Weights   02/03/23 0306 02/04/23 0500  02/05/23 0500  Weight: 75.2 kg 70.8 kg 72.6 kg    Examination:   Awake Alert, Oriented X 1, frail, deconditioned, ill-appearing, Symmetrical Chest wall movement, Good air movement bilaterally, CTAB RRR,No Gallops,Rubs or new Murmurs, No Parasternal Heave +ve B.Sounds, Abd Soft, No tenderness, No rebound - guarding or rigidity. No Cyanosis, Clubbing or edema, No new Rash or bruise         Data Reviewed: I have personally reviewed following labs and imaging studies  CBC: Recent Labs  Lab 01/31/23 0534 02/01/23 0445 02/02/23 0413 02/03/23 0428 02/04/23 0537  WBC 13.4* 15.5* 16.8* 18.6* 18.6*  HGB 11.3* 12.1 10.9* 10.5* 10.7*  HCT 34.4* 37.1 33.4* 33.0* 33.1*  MCV 87.3 85.9 86.8 85.7 85.5  PLT 148* 178 207 294 376    Basic Metabolic Panel: Recent Labs  Lab 01/30/23 1715 01/31/23 0534 01/31/23 1631 02/01/23 0445 02/02/23 0413 02/03/23 0428 02/04/23 0537  NA  --  142  --  143 143 140 137  K  --  4.5  --  3.8 4.0 3.7 4.2  CL  --  109  --  109 110 109 107  CO2  --  25  --  25 27 23  19*  GLUCOSE  --  135*  --  160* 125* 179* 200*  BUN  --  14  --  10 8 8 11   CREATININE  --  1.03*  --  0.60 0.53 0.62 0.54  CALCIUM  --  8.0*  --  7.6* 7.5* 7.2* 7.3*  MG 2.4 2.2 2.1 2.0 2.0 1.7  --   PHOS 1.7* <1.0* 2.4* 2.4* 2.9  --   --     GFR: Estimated Creatinine Clearance: 77.7 mL/min (by C-G formula based on SCr of 0.54 mg/dL).  Liver Function Tests: Recent Labs  Lab 01/30/23 0332 01/31/23 0534 02/01/23 0445 02/03/23 0428 02/04/23 0537  AST 82* 51* 39 26 22  ALT 45* 37 34 25 19  ALKPHOS 152* 166* 159* 139* 123  BILITOT 1.4* 0.8 0.5 0.3 0.5  PROT 4.2* 4.4* 4.4* 4.1* 4.3*  ALBUMIN <1.5* <1.5* <1.5* <1.5* <1.5*    CBG: Recent Labs  Lab 02/04/23 2000 02/04/23 2323 02/05/23 0308 02/05/23 0925 02/05/23 1319  GLUCAP 178* 160* 144* 231* 249*     Recent Results (from the past 240 hour(s))  Blood culture (routine x 2)     Status: Abnormal   Collection Time:  01/27/23  6:15 AM   Specimen: BLOOD  Result Value Ref Range Status   Specimen Description   Final    BLOOD CENT CENTRAL LINE Performed at [facility], [address] [place], [place] [address]    Special Requests   Final    BOTTLES DRAWN AEROBIC AND ANAEROBIC Blood Culture adequate volume Performed at [facility], [address], [place], [place] [address]    Culture  Setup Time   Final    GRAM POSITIVE COCCI ANAEROBIC BOTTLE ONLY CRITICAL RESULT CALLED TO, READ BACK BY AND VERIFIED WITH: [facility] IZQUIERDO AT 7846 01/28/2023 BY T KENNEDY CRITICAL RESULT CALLED TO, READ BACK BY AND VERIFIED WITH: PHARMD Hillery Aldo [id number] AT 1011 BY CM    Culture (A)  Final    STAPHYLOCOCCUS HOMINIS THE SIGNIFICANCE OF ISOLATING THIS ORGANISM FROM  A SINGLE SET OF BLOOD CULTURES WHEN MULTIPLE SETS ARE DRAWN IS UNCERTAIN. PLEASE NOTIFY THE MICROBIOLOGY DEPARTMENT WITHIN ONE WEEK IF SPECIATION AND SENSITIVITIES ARE REQUIRED. Performed at [facility], [address], [place], [place] [address]    Report Status 02/01/2023 FINAL  Final  Blood culture (routine x 2)     Status: None   Collection Time: 01/27/23  6:15 AM   Specimen: BLOOD  Result Value Ref Range Status   Specimen Description BLOOD BLOOD LEFT HAND  Final   Special Requests   Final    BOTTLES DRAWN AEROBIC ONLY Blood Culture results may not be optimal due to an inadequate volume of blood received in culture bottles   Culture   Final    NO GROWTH 5 DAYS Performed at [facility], [address] [place], [place] [address]    Report Status 02/01/2023 FINAL  Final  Blood Culture ID Panel (Reflexed)     Status: Abnormal   Collection Time: 01/27/23  6:15 AM  Result Value Ref Range Status   Enterococcus faecalis NOT DETECTED NOT DETECTED Final   Enterococcus Faecium NOT DETECTED NOT DETECTED Final   Listeria monocytogenes NOT DETECTED NOT DETECTED Final   Staphylococcus species DETECTED (A) NOT DETECTED Final    Comment: CRITICAL RESULT  CALLED TO, READ BACK BY AND VERIFIED WITH: Marita Snellen [id number] AT 1012 BY CM    Staphylococcus aureus (BCID) NOT DETECTED NOT DETECTED Final   Staphylococcus epidermidis NOT DETECTED NOT DETECTED Final   Staphylococcus lugdunensis NOT DETECTED NOT DETECTED Final   Streptococcus species NOT DETECTED NOT DETECTED Final   Streptococcus agalactiae NOT DETECTED NOT DETECTED Final   Streptococcus pneumoniae NOT DETECTED NOT DETECTED Final   Streptococcus pyogenes NOT DETECTED NOT DETECTED Final   A.calcoaceticus-baumannii NOT DETECTED NOT DETECTED Final   Bacteroides fragilis NOT DETECTED NOT DETECTED Final   Enterobacterales NOT DETECTED NOT DETECTED Final   Enterobacter cloacae complex NOT DETECTED NOT DETECTED Final   Escherichia coli NOT DETECTED NOT DETECTED Final   Klebsiella aerogenes NOT DETECTED NOT DETECTED Final   Klebsiella oxytoca NOT DETECTED NOT DETECTED Final   Klebsiella pneumoniae NOT DETECTED NOT DETECTED Final   Proteus species NOT DETECTED NOT DETECTED Final   Salmonella species NOT DETECTED NOT DETECTED Final   Serratia marcescens NOT DETECTED NOT DETECTED Final   Haemophilus influenzae NOT DETECTED NOT DETECTED Final   Neisseria meningitidis NOT DETECTED NOT DETECTED Final   Pseudomonas aeruginosa NOT DETECTED NOT DETECTED Final   Stenotrophomonas maltophilia NOT DETECTED NOT DETECTED Final   Candida albicans NOT DETECTED NOT DETECTED Final   Candida auris NOT DETECTED NOT DETECTED Final   Candida glabrata NOT DETECTED NOT DETECTED Final   Candida krusei NOT DETECTED NOT DETECTED Final   Candida parapsilosis NOT DETECTED NOT DETECTED Final   Candida tropicalis NOT DETECTED NOT DETECTED Final   Cryptococcus neoformans/gattii NOT DETECTED NOT DETECTED Final    Comment: Performed at [facility], [address], [place], [place] [address]  Resp panel by RT-PCR (RSV, Flu A&B, Covid) Anterior Nasal Swab     Status: None   Collection Time: 01/27/23  9:23 AM    Specimen: Anterior Nasal Swab  Result Value Ref Range Status   SARS Coronavirus 2 by RT PCR NEGATIVE NEGATIVE Final    Comment: (NOTE) SARS-CoV-2 target nucleic acids are NOT DETECTED.  The SARS-CoV-2 RNA is generally detectable in upper respiratory specimens during the acute phase of infection. The lowest concentration  of SARS-CoV-2 viral copies this assay can detect is 138 copies/mL. A negative result does not preclude SARS-Cov-2 infection and should not be used as the sole basis for treatment or other patient management decisions. A negative result may occur with  improper specimen collection/handling, submission of specimen other than nasopharyngeal swab, presence of viral mutation(s) within the areas targeted by this assay, and inadequate number of viral copies(<138 copies/mL). A negative result must be combined with clinical observations, patient history, and epidemiological information. The expected result is Negative.  Fact Sheet for Patients:  BloggerCourse.com  Fact Sheet for Healthcare Providers:  SeriousBroker.it  This test is no t yet approved or cleared by the [place] FDA and  has been authorized for detection and/or diagnosis of SARS-CoV-2 by FDA under an Emergency Use Authorization (EUA). This EUA will remain  in effect (meaning this test can be used) for the duration of the COVID-19 declaration under Section 564(b)(1) of the Act, 21 U.S.C.section 360bbb-3(b)(1), unless the authorization is terminated  or revoked sooner.       Influenza A by PCR NEGATIVE NEGATIVE Final   Influenza B by PCR NEGATIVE NEGATIVE Final    Comment: (NOTE) The Xpert Xpress SARS-CoV-2/FLU/RSV plus assay is intended as an aid in the diagnosis of influenza from Nasopharyngeal swab specimens and should not be used as a sole basis for treatment. Nasal washings and aspirates are unacceptable for Xpert Xpress  SARS-CoV-2/FLU/RSV testing.  Fact Sheet for Patients: BloggerCourse.com  Fact Sheet for Healthcare Providers: SeriousBroker.it  This test is not yet approved or cleared by the [place] FDA and has been authorized for detection and/or diagnosis of SARS-CoV-2 by FDA under an Emergency Use Authorization (EUA). This EUA will remain in effect (meaning this test can be used) for the duration of the COVID-19 declaration under Section 564(b)(1) of the Act, 21 U.S.C. section 360bbb-3(b)(1), unless the authorization is terminated or revoked.     Resp Syncytial Virus by PCR NEGATIVE NEGATIVE Final    Comment: (NOTE) Fact Sheet for Patients: BloggerCourse.com  Fact Sheet for Healthcare Providers: SeriousBroker.it  This test is not yet approved or cleared by the [place] FDA and has been authorized for detection and/or diagnosis of SARS-CoV-2 by FDA under an Emergency Use Authorization (EUA). This EUA will remain in effect (meaning this test can be used) for the duration of the COVID-19 declaration under Section 564(b)(1) of the Act, 21 U.S.C. section 360bbb-3(b)(1), unless the authorization is terminated or revoked.  Performed at [facility], [address] [place], [place] [address]   MRSA Next Gen by PCR, Nasal     Status: None   Collection Time: 01/27/23 10:53 AM   Specimen: Nasal Mucosa; Nasal Swab  Result Value Ref Range Status   MRSA by PCR Next Gen NOT DETECTED NOT DETECTED Final    Comment: (NOTE) The GeneXpert MRSA Assay (FDA approved for NASAL specimens only), is one component of a comprehensive MRSA colonization surveillance program. It is not intended to diagnose MRSA infection nor to guide or monitor treatment for MRSA infections. Test performance is not FDA approved in patients less than 90 years old. Performed at [facility], [address]  [place], [place] [address]   Calprotectin, Fecal     Status: Abnormal   Collection Time: 01/30/23  3:31 AM   Specimen: Stool  Result Value Ref Range Status   Calprotectin, Fecal 562 (H) 0 - 120 ug/g Final    Comment: (NOTE) Concentration  Interpretation   Follow-Up < 5 - 50 ug/g     Normal           None >50 -120 ug/g     Borderline       Re-evaluate in 4-6 weeks    >120 ug/g     Abnormal         Repeat as clinically                                   indicated Performed At: [facility] [address] [place], [place] [id number] Jolene Schimke MD WU:[id number]   Gastrointestinal Panel by PCR , Stool     Status: None   Collection Time: 01/30/23  3:31 AM   Specimen: Stool  Result Value Ref Range Status   Campylobacter species NOT DETECTED NOT DETECTED Final   Plesimonas shigelloides NOT DETECTED NOT DETECTED Final   Salmonella species NOT DETECTED NOT DETECTED Final   Yersinia enterocolitica NOT DETECTED NOT DETECTED Final   Vibrio species NOT DETECTED NOT DETECTED Final   Vibrio cholerae NOT DETECTED NOT DETECTED Final   Enteroaggregative E coli (EAEC) NOT DETECTED NOT DETECTED Final   Enteropathogenic E coli (EPEC) NOT DETECTED NOT DETECTED Final   Enterotoxigenic E coli (ETEC) NOT DETECTED NOT DETECTED Final   Shiga like toxin producing E coli (STEC) NOT DETECTED NOT DETECTED Final   Shigella/Enteroinvasive E coli (EIEC) NOT DETECTED NOT DETECTED Final   Cryptosporidium NOT DETECTED NOT DETECTED Final   Cyclospora cayetanensis NOT DETECTED NOT DETECTED Final   Entamoeba histolytica NOT DETECTED NOT DETECTED Final   Giardia lamblia NOT DETECTED NOT DETECTED Final   Adenovirus F40/41 NOT DETECTED NOT DETECTED Final   Astrovirus NOT DETECTED NOT DETECTED Final   Norovirus GI/GII NOT DETECTED NOT DETECTED Final   Rotavirus A NOT DETECTED NOT DETECTED Final   Sapovirus (I, II, IV, and V) NOT DETECTED NOT DETECTED Final    Comment: Performed at [facility], [address] [place], [place] [address]  C Difficile Quick Screen w PCR reflex     Status: Abnormal   Collection Time: 01/30/23  4:34 AM   Specimen: Stool  Result Value Ref Range Status   C Diff antigen POSITIVE (A) NEGATIVE Final   C Diff toxin NEGATIVE NEGATIVE Final   C Diff interpretation Results are indeterminate. See PCR results.  Final    Comment: Performed at [facility], [address], [place], [place] [address]  C. Diff by PCR, Reflexed     Status: Abnormal   Collection Time: 01/30/23  4:34 AM  Result Value Ref Range Status   Toxigenic C. Difficile by PCR POSITIVE (A) NEGATIVE Final    Comment: Positive for toxigenic C. difficile with little to no toxin production. Only treat if clinical presentation suggests symptomatic illness. Performed at [facility], [address], [place], [place] [address]          Radiology Studies: No results found.      Scheduled Meds:  sodium chloride   Intravenous Once   Chlorhexidine Gluconate Cloth  6 each Topical Daily   feeding supplement (PROSource TF20)  60 mL Per Tube Daily   fiber supplement (BANATROL TF)  60 mL Per Tube BID   Gerhardt's butt cream   Topical BID   heparin injection (subcutaneous)  5,000 Units Subcutaneous Q8H   insulin aspart  0-9 Units Subcutaneous Q4H  levETIRAcetam  750 mg Per Tube BID   metoprolol tartrate  25 mg Oral BID   metroNIDAZOLE  500 mg Per Tube Q12H   pantoprazole (PROTONIX) IV  40 mg Intravenous Q12H   thiamine  100 mg Per Tube Daily   vancomycin  125 mg Per Tube Q6H   Continuous Infusions:  sodium chloride Stopped (02/02/23 0725)   feeding supplement (OSMOLITE 1.2 CAL) 1,000 mL (02/03/23 1322)     LOS: 9 days      Huey Bienenstock, MD [facility]   To contact the attending provider between 7A-7P or the covering provider during after hours 7P-7A, please log into the web site [email or web address] and access using universal [place] password for that web site. If  you do not have the password, please call the hospital operator.  02/05/2023, 3:14 PM

## 2023-02-06 ENCOUNTER — Other Ambulatory Visit: Payer: Self-pay

## 2023-02-06 DIAGNOSIS — I469 Cardiac arrest, cause unspecified: Secondary | ICD-10-CM | POA: Diagnosis not present

## 2023-02-06 DIAGNOSIS — G9341 Metabolic encephalopathy: Secondary | ICD-10-CM | POA: Diagnosis not present

## 2023-02-06 DIAGNOSIS — K5090 Crohn's disease, unspecified, without complications: Secondary | ICD-10-CM | POA: Diagnosis not present

## 2023-02-06 DIAGNOSIS — J9601 Acute respiratory failure with hypoxia: Secondary | ICD-10-CM | POA: Diagnosis not present

## 2023-02-06 DIAGNOSIS — K50119 Crohn's disease of large intestine with unspecified complications: Secondary | ICD-10-CM | POA: Diagnosis not present

## 2023-02-06 DIAGNOSIS — A0472 Enterocolitis due to Clostridium difficile, not specified as recurrent: Secondary | ICD-10-CM | POA: Diagnosis not present

## 2023-02-06 LAB — GLUCOSE, CAPILLARY
Glucose-Capillary: 117 mg/dL — ABNORMAL HIGH (ref 70–99)
Glucose-Capillary: 146 mg/dL — ABNORMAL HIGH (ref 70–99)
Glucose-Capillary: 166 mg/dL — ABNORMAL HIGH (ref 70–99)
Glucose-Capillary: 172 mg/dL — ABNORMAL HIGH (ref 70–99)
Glucose-Capillary: 186 mg/dL — ABNORMAL HIGH (ref 70–99)
Glucose-Capillary: 195 mg/dL — ABNORMAL HIGH (ref 70–99)

## 2023-02-06 LAB — C-REACTIVE PROTEIN: CRP: 11.4 mg/dL — ABNORMAL HIGH (ref <1.0)

## 2023-02-06 LAB — TSH: TSH: 4.878 u[IU]/mL — ABNORMAL HIGH (ref 0.350–4.500)

## 2023-02-06 MED ORDER — METOPROLOL TARTRATE 25 MG PO TABS
25.0000 mg | ORAL_TABLET | Freq: Once | ORAL | Status: AC
  Administered 2023-02-06: 25 mg via ORAL
  Filled 2023-02-06: qty 1

## 2023-02-06 MED ORDER — METOPROLOL TARTRATE 50 MG PO TABS
50.0000 mg | ORAL_TABLET | Freq: Two times a day (BID) | ORAL | Status: DC
  Administered 2023-02-06 – 2023-02-08 (×4): 50 mg via ORAL
  Filled 2023-02-06 (×4): qty 1

## 2023-02-06 MED ORDER — ORAL CARE MOUTH RINSE
15.0000 mL | OROMUCOSAL | Status: DC
  Administered 2023-02-06 – 2023-02-19 (×47): 15 mL via OROMUCOSAL

## 2023-02-06 NOTE — Progress Notes (Signed)
PICC order received: contacted daughter for consent, she expressed concern about risk for thrombus due to pt's history. Same discussed with Dr. Randol Kern: cancel PICC for now, pt has adequate access.

## 2023-02-06 NOTE — Progress Notes (Signed)
Daily Progress Note  DOA: 01/27/2023 Hospital Day: 11  Chief Complaint: shock, Crohn's  ASSESSMENT    Brief Narrative:  [name] is a 52 y.o. year old female with a history of colonic Crohn's disease with rectal/rectosigmoid stricture ,  history of recurrent C. difficile, COPD, diabetes, seizure disorder admitted with concern for sepsis of unclear etiology, acute kidney injury and septic encephalopathy requiring mechanical ventilation.   Crohn's colitis complicated by colonic stricture on Skyrizi. Severe colitis on sigmoidoscopy this admission ( ? Reason for sepsis) Despite negative C. difficile toxin (Ag+ though), flexible sigmoidoscopy on 10/2 with severe colitis with pseudomembranous features. Started PO after sigmoidoscopy.   Shock / sepsis?  Etiology of shock not clear but ? Infectious colitis ( C-diff) . Has just completed 10 days of Rocephin / flagyl for sepsis. Blood culture >> ? contaminant *WBC still elevated at 17.4  Principal Problem:   Cardiac arrest [facility]) Active Problems:   Shock (HCC)   Metabolic encephalopathy   Acute respiratory failure with hypoxia (HCC)   Upper GI bleed   Granulomatous colitis (HCC)   Pseudomembranous colitis   Crohn's disease without complication (HCC)   PLAN   --Completed 10 days of Rocephin / flagyl  -Continue oral vancomycin for total of 14 days - Holding off on steroids for now unless she starts to gets worse off antibiotics.  - Obtain and trend CRP -- Continue enteral feedings   Subjective   Generalized abdominal pain ( feels "full'). Has nausea but no vomiting. Getting enteral feedings at 60 ml / hr. About 250 thick liquid stool in flexiseal bag   Objective   Sigmoidoscopy 10/2 - Stool in the recto-sigmoid colon and in the sigmoid colon. - Diffuse severe mucosal changes were found in the rectum, in the recto-sigmoid colon and in the distal sigmoid colon consistent with Crohn's disease and likely consistent with  infectious colitis.  No specimens collected.  BMET Recent Labs    02/04/23 0537 02/05/23 1720  NA 137 134*  K 4.2 4.1  CL 107 104  CO2 19* 22  GLUCOSE 200* 229*  BUN 11 12  CREATININE 0.54 0.57  CALCIUM 7.3* 7.8*   LFT Recent Labs    02/04/23 0537  PROT 4.3*  ALBUMIN <1.5*  AST 22  ALT 19  ALKPHOS 123  BILITOT 0.5   PT/INR No results for input(s): "LABPROT", "INR" in the last 72 hours.   Imaging:  [place] EKG SITE RITE If Site Rite image not attached, placement could not be confirmed due to  current cardiac rhythm.     Scheduled inpatient medications:   sodium chloride   Intravenous Once   Chlorhexidine Gluconate Cloth  6 each Topical Daily   feeding supplement (PROSource TF20)  60 mL Per Tube Daily   fiber supplement (BANATROL TF)  60 mL Per Tube BID   Gerhardt's butt cream   Topical BID   heparin injection (subcutaneous)  5,000 Units Subcutaneous Q8H   insulin aspart  0-9 Units Subcutaneous Q4H   insulin glargine-yfgn  5 Units Subcutaneous BID   levETIRAcetam  750 mg Per Tube BID   metoprolol tartrate  50 mg Oral BID   metroNIDAZOLE  500 mg Per Tube Q12H   mouth rinse  15 mL Mouth Rinse 4 times per day   pantoprazole (PROTONIX) IV  40 mg Intravenous Q12H   thiamine  100 mg Per Tube Daily   vancomycin  125 mg Per Tube Q6H   Continuous inpatient infusions:  sodium chloride Stopped (02/02/23 0725)   albumin human 25 g (02/06/23 1002)   feeding supplement (OSMOLITE 1.2 CAL) 1,000 mL (02/03/23 1322)   PRN inpatient medications: sodium chloride, acetaminophen, albuterol, magic mouthwash  Vital signs in last 24 hours: Temp:  [97.5 F (36.4 C)-98.3 F (36.8 C)] 98.2 F (36.8 C) (10/06 1100) Pulse Rate:  [116-133] 116 (10/06 1100) Resp:  [20-34] 24 (10/06 1100) BP: (101-129)/(45-74) 118/58 (10/06 1100) SpO2:  [95 %-100 %] 95 % (10/06 1100) Weight:  [69.5 kg] 69.5 kg (10/06 0437) Last BM Date : 02/04/23  Intake/Output Summary (Last 24 hours) at 02/06/2023  1150 Last data filed at 02/06/2023 1100 Gross per 24 hour  Intake 515.66 ml  Output 3950 ml  Net -3434.34 ml    Intake/Output from previous day: 10/05 0701 - 10/06 0700 In: 75.7 [P.O.:50; IV Piggyback:25.7] Out: 1700 [Urine:1000; Stool:700] Intake/Output this shift: Total I/O In: 440 [P.O.:240; NG/GT:200] Out: 2250 [Urine:1200; Stool:1050]   Physical Exam:  General: Alert female in NAD Heart:  Regular rate and rhythm.  Pulmonary: Normal respiratory effort Abdomen: Soft, generalized tenderness, . Normal bowel sounds. Neurologic: Alert and oriented Psych: Pleasant. Cooperative. Insight appears normal.      LOS: 10 days   Willette Cluster ,NP 02/06/2023, 11:50 AM

## 2023-02-06 NOTE — Evaluation (Signed)
Speech Language Pathology Evaluation Patient Details Name: [name] MRN: [id number] DOB: [birth year] Today's Date: 02/06/2023 Time: 4132-4401 SLP Time Calculation (min) (ACUTE ONLY): 21 min  Problem List:  Patient Active Problem List   Diagnosis Date Noted   Pseudomembranous colitis 02/03/2023   Crohn's disease without complication (HCC) 02/03/2023   Granulomatous colitis (HCC) 01/28/2023   Cardiac arrest (HCC) 01/27/2023   Acute respiratory failure with hypoxia (HCC) 01/27/2023   Upper GI bleed 01/27/2023   GI bleed 01/15/2022   Rectal bleeding 01/12/2022   Acute deep vein thrombosis (DVT) of brachial vein of right upper extremity (HCC)    Enteritis due to Clostridium difficile 12/24/2021   Proctocolitis 12/23/2021   UTI (urinary tract infection) 12/23/2021   Thrombocytosis 10/02/2021   Gait instability 10/02/2021   Hypokalemia 10/01/2021   Skin test reaction, systemic 09/07/2021   N&V (nausea and vomiting) 04/30/2021   Abdominal pain    Bloating    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HCC)    Hematochezia 02/04/2021   Crohn's disease of large intestine with other complication (HCC) 02/03/2021   Diarrhea    Metabolic encephalopathy 01/20/2021   Moderate Pericardial effusion 01/19/2021   Right foot pain    AKI (acute kidney injury) (HCC) 01/16/2021   Rhabdomyolysis 01/16/2021   Lactic acidosis 01/16/2021   COVID-19 virus infection 01/16/2021   Shock (HCC) 01/16/2021   Seizure (HCC) 06/24/2020   MDD (major depressive disorder) 03/24/2020   Chest pain 02/07/2020   Chronic patellofemoral pain of both knees 01/11/2020   Essential hypertension 06/17/2018   Insomnia 12/13/2017   Neuropathy 12/13/2017   Chest pain at rest 05/09/2016   COPD (chronic obstructive pulmonary disease) (HCC)    Crohn's disease with complication (HCC)    Diabetes mellitus without complication (HCC)    Anxiety    Cigarette smoker    PTSD (post-traumatic stress disorder)    Palpitation     Constipation 09/20/2012   Head injury 05/09/2012   Hyperlipidemia 04/11/2012   DJD (degenerative joint disease), lumbar 04/04/2012   Paroxysmal SVT (supraventricular tachycardia) (HCC) 04/04/2012   Tobacco dependence 04/04/2012   DOE (dyspnea on exertion) 01/26/2012   Paresthesia 11/25/2011   Abdominal pain, chronic, epigastric 11/01/2011   Abdominal pain, acute, periumbilical 11/01/2011   GERD (gastroesophageal reflux disease)    Hyperglycemia    Chronic low back pain    IBS (irritable bowel syndrome)    Closed head injury    TOBACCO ABUSE 07/13/2010   Palpitations 07/13/2010   Pleuritic chest pain 07/13/2010   Anxiety state 07/03/2008   BACK PAIN, LUMBAR, CHRONIC 07/03/2008   Past Medical History:  Past Medical History:  Diagnosis Date   Anxiety    Asthma    Chronic low back pain    Collagen vascular disease (HCC)    COPD (chronic obstructive pulmonary disease) (HCC)    Crohn's disease (HCC)    GERD (gastroesophageal reflux disease)    EGD 01/2007 by Dr.Rourke small hiatal hernia s/p 56 french maloney    History of head injury    Hypertension    IBS (irritable bowel syndrome)    PSVT (paroxysmal supraventricular tachycardia) (HCC)    PTSD (post-traumatic stress disorder)    Recurrent chest pain    Seizure disorder (HCC)    Type 2 diabetes mellitus (HCC)    Past Surgical History:  Past Surgical History:  Procedure Laterality Date   ABDOMINAL HYSTERECTOMY     with right salpingo oophorectomy 2005   APPENDECTOMY  2006   BALLOON DILATION  N/A 02/19/2021   Procedure: BALLOON DILATION;  Surgeon: Lanelle Bal, DO;  [facility];  Service: Endoscopy;  Laterality: N/A;  Sigmoid colon stricture   BIOPSY  02/06/2021   Procedure: BIOPSY;  Surgeon: Lanelle Bal, DO;  [facility];  Service: Endoscopy;;   BIOPSY  02/19/2021   Procedure: BIOPSY;  Surgeon: Lanelle Bal, DO;  [facility];  Service: Endoscopy;;   BIOPSY  07/15/2021    Procedure: BIOPSY;  Surgeon: Malissa Hippo, MD;  [facility];  Service: Endoscopy;;   CESAREAN SECTION     1996   CHOLECYSTECTOMY     COLONOSCOPY  2010   Dr. Jena Gauss; negative except for hemorrhoids   ESOPHAGEAL DILATION N/A 10/25/2014   Procedure: ESOPHAGEAL DILATION;  Surgeon: Malissa Hippo, MD;  [facility];  Service: Endoscopy;  Laterality: N/A;   ESOPHAGOGASTRODUODENOSCOPY N/A 10/25/2014   Procedure: ESOPHAGOGASTRODUODENOSCOPY (EGD);  Surgeon: Malissa Hippo, MD;  [facility];  Service: Endoscopy;  Laterality: N/A;  1250   FLEXIBLE SIGMOIDOSCOPY N/A 02/06/2021   Procedure: FLEXIBLE SIGMOIDOSCOPY;  Surgeon: Lanelle Bal, DO;  [facility];  Service: Endoscopy;  Laterality: N/A;   FLEXIBLE SIGMOIDOSCOPY N/A 02/19/2021   Procedure: FLEXIBLE SIGMOIDOSCOPY;  Surgeon: Lanelle Bal, DO;  [facility];  Service: Endoscopy;  Laterality: N/A;   OOPHORECTOMY     left for torsion and ovarian fibroma; uterine myoma resected 1995   SIGMOIDOSCOPY  07/15/2021   Procedure: SIGMOIDOSCOPY;  Surgeon: Malissa Hippo, MD;  [facility];  Service: Endoscopy;;   HPI:  52 yo female admitted 9/26 after found down for an unspecified amount of time with possible GIB, agonal breathing and pulseless with CPR 5 min. Intubated in [facility] ED and transferred to [facility]. Pt with AKI, elevated lactate, septic shock. 10/2 extubated and EGD. PMhx: COPD, chron's, HTN, DM, PTSD, seizure disorder, chronic back pain   Assessment / Plan / Recommendation Clinical Impression  Pt reports feeling disoriented this date. PTA she states she was living with her daughter and grandchildren and receiving some assistance with ADLs. She was oriented to self, place, and time, although not to situation. She presents with deficits related to memory storage and retrieval, sustained attention, problem solving, and awareness. Pt's auditory comprehension appears intact,  although note potential to be affected by difficulty with attention. As complexity increased, pt was unsuccessful with following directions to complete tasks. Pt had increased difficulty with storage of five objects during a memory task and was only able to repeat 2/5 words given multiple repetitions. She was 100% accurate with simple addition and multiplication, although was unable to perform subtraction or simple calculations in a word problem task. She was successful with digit repeition up to four digits, although required increased cueing to attempt stating them backwards and was accurate in 1/3 opportunities. Pt 90% accurate with yes/no questions. She named 11 animals in a one-minute divergent naming task. Overall, pt is demonstrating acute signs of cognitive impairment and may benefit from further ST intervention to target deficits listed above. Will continue to follow.    SLP Assessment  SLP Recommendation/Assessment: Patient needs continued Speech Lanaguage Pathology Services SLP Visit Diagnosis: Cognitive communication deficit (R41.841)    Recommendations for follow up therapy are one component of a multi-disciplinary discharge planning process, led by the attending physician.  Recommendations may be updated based on patient status, additional functional criteria and insurance authorization.  Follow Up Recommendations  Skilled nursing-short term rehab (<3 hours/day)    Assistance Recommended at Discharge  Frequent or constant Supervision/Assistance  Functional Status Assessment Patient has had a recent decline in their functional status and demonstrates the ability to make significant improvements in function in a reasonable and predictable amount of time.  Frequency and Duration min 2x/week  2 weeks      SLP Evaluation Cognition  Overall Cognitive Status: Impaired/Different from baseline Arousal/Alertness: Awake/alert Orientation Level: Oriented to person;Oriented to place;Oriented  to time;Disoriented to situation Attention: Sustained Sustained Attention: Impaired Sustained Attention Impairment: Verbal basic;Functional basic Memory: Impaired Memory Impairment: Storage deficit;Retrieval deficit Awareness: Impaired Awareness Impairment: Emergent impairment Problem Solving: Impaired Problem Solving Impairment: Verbal basic       Comprehension  Auditory Comprehension Overall Auditory Comprehension: Appears within functional limits for tasks assessed    Expression Expression Primary Mode of Expression: Verbal Verbal Expression Overall Verbal Expression: Impaired Naming: Impairment Divergent: 50-74% accurate   Oral / Motor  Oral Motor/Sensory Function Overall Oral Motor/Sensory Function: Within functional limits Motor Speech Overall Motor Speech: Appears within functional limits for tasks assessed            Gwynneth Aliment, M.A., CF-SLP Speech Language Pathology, Acute Rehabilitation Services  Secure Chat preferred [phone]  02/06/2023, 2:39 PM

## 2023-02-06 NOTE — Progress Notes (Signed)
TRH night cross cover note:   I was notified by RN that peripheral IV access has been lost in this patient who has been receiving IV antibiotics as well as albumin.  IV team was consulted and unsuccessful on multiple attempts at attempting to reestablish peripheral access, and consequently recommend pursuit of PICC line to reestablish access.   I subsequently placed orders for PICC line placement via ultrasound by PICC RN.    Newton Pigg, DO Hospitalist

## 2023-02-06 NOTE — Progress Notes (Signed)
PROGRESS NOTE    [name]  ZOX:[id number] DOB: 1971-01-17 DOA: 01/27/2023 PCP: [name], PA-C    Chief Complaint  Patient presents with   Respiratory Arrest    Brief Narrative:   This is a 52 year old female with past medical history of COPD, Crohn's on Skyrizi, hypertension, type 2 diabetes mellitus who initially presented to the after being found unresponsive. Per family, patient has had bloody emesis with diarrhea over the past few days. Initial systolics in the 60s in the ED, and patient was minimally responsive. In the emergency room, patient was intubated, and patient was admitted to the ICU for further evaluation and management for shock and altered mental status. She had concern for ongoing colitis. Patient was taken for flexible sigmoidoscopy by GI yesterday. This revealed mucosal erosions consistent with her history of Crohn's. They started her on vancomycin with her history of C. difficile and decided to hold off on steroids.  -Improved and transferred to [facility] service-progressive care 10//2024.  Assessment & Plan:   Principal Problem:   Cardiac arrest [facility]) Active Problems:   Metabolic encephalopathy   Shock (HCC)   Acute respiratory failure with hypoxia (HCC)   Upper GI bleed   Granulomatous colitis (HCC)   Pseudomembranous colitis   Crohn's disease without complication (HCC)       Cardiac arrest -Required intubation on presentation, She is successfully extubated, appears to be improving   Infectious colitis/membranous colitis due to C. Difficile - flexible sigmoidoscopy on 10/2 with severe colitis with pseudomembranous features. -Despite negative C. difficile toxin, but positive antigen, but clinical sensations and findings on sigmoidoscopy, concerning for C. difficile colitis, started on p.o. vancomycin -She finished total 10 days of IV Rocephin and Flagyl. -Vancomycin day 5 of 14 -GI following, appreciate recommendations   Type 2 diabetes  mellitus.  -Continue with insulin sliding scale for now -CBG remains significantly elevated, continue with insulin sliding scale but will add Semglee 5 units twice daily   History of seizures Still on Keppra.  No concern for seizure-like activity.  Doing well. -Continue Keppra   Anxiety Given altered mental status, will continue to hold Xanax.   Hypertension -Blood pressure started to increase, will go ahead and increase her metoprolol further to 50 mg p.o. twice daily especially with her sinus tachycardia.  Will resume low-dose metoprolol (she is on metoprolol tartrate 25 mg twice daily at home)  Sinus tachycardia -Multi factorial, due to acute illness, deconditioning, as well likely from holding metoprolol since admission,, continue to monitor until he, main significant, will check TSH and repeat 2D echo  Acute encephalopathy Dysphagia -Patient remains with slurred speech, mild confusion, but mentation appears to continue to improve, most likely in the setting of septic shock, postcardiac arrest, will consult SLP regarding cognition/speech evaluation -SLP following closely    COPD No concern for exacerbation at this time.  Will continue with as needed albuterol.   Nutrition PCM -She remains on tube feed, started on a diet, but oral intake remains poor, so continue with tube feed for now -Albumin significantly low, she is with anasarca, started on IV albumin  DVT prophylaxis: Heparin Code Status: Full code Family communication: Discussed with daughter by phone 10/5 Disposition:   Status is: Inpatient    Consultants:  PCCM Gastroenterology  Subjective:  Significant events with staff, she denies any complaints today.  Objective: Vitals:   02/06/23 0437 02/06/23 0726 02/06/23 0800 02/06/23 1100  BP:  (!) 124/50 129/61 (!) 118/58  Pulse:  (!) 129 Marland Kitchen)  130 (!) 116  Resp:  (!) 23 (!) 27 (!) 24  Temp:  98.2 F (36.8 C) 98.2 F (36.8 C) 98.2 F (36.8 C)  TempSrc:   Oral Oral Oral  SpO2:  99% 100% 95%  Weight: 69.5 kg     Height:        Intake/Output Summary (Last 24 hours) at 02/06/2023 1156 Last data filed at 02/06/2023 1100 Gross per 24 hour  Intake 515.66 ml  Output 3950 ml  Net -3434.34 ml   Filed Weights   02/04/23 0500 02/05/23 0500 02/06/23 0437  Weight: 70.8 kg 72.6 kg 69.5 kg    Examination:   Awake Alert, Oriented X 1, frail, deconditioned, ill-appearing, Symmetrical Chest wall movement, Good air movement bilaterally, CTAB RRR,No Gallops,Rubs or new Murmurs, No Parasternal Heave +ve B.Sounds, Abd Soft, No tenderness, No rebound - guarding or rigidity. No Cyanosis, Clubbing, third spacing-anasarca present      Data Reviewed: I have personally reviewed following labs and imaging studies  CBC: Recent Labs  Lab 02/01/23 0445 02/02/23 0413 02/03/23 0428 02/04/23 0537 02/05/23 1720  WBC 15.5* 16.8* 18.6* 18.6* 17.4*  HGB 12.1 10.9* 10.5* 10.7* 11.8*  HCT 37.1 33.4* 33.0* 33.1* 37.1  MCV 85.9 86.8 85.7 85.5 87.7  PLT 178 207 294 376 482*    Basic Metabolic Panel: Recent Labs  Lab 01/31/23 0534 01/31/23 1631 02/01/23 0445 02/02/23 0413 02/03/23 0428 02/04/23 0537 02/05/23 1720  NA 142  --  143 143 140 137 134*  K 4.5  --  3.8 4.0 3.7 4.2 4.1  CL 109  --  109 110 109 107 104  CO2 25  --  25 27 23  19* 22  GLUCOSE 135*  --  160* 125* 179* 200* 229*  BUN 14  --  10 8 8 11 12   CREATININE 1.03*  --  0.60 0.53 0.62 0.54 0.57  CALCIUM 8.0*  --  7.6* 7.5* 7.2* 7.3* 7.8*  MG 2.2 2.1 2.0 2.0 1.7  --  2.0  PHOS <1.0* 2.4* 2.4* 2.9  --   --  3.0    GFR: Estimated Creatinine Clearance: 76 mL/min (by C-G formula based on SCr of 0.57 mg/dL).  Liver Function Tests: Recent Labs  Lab 01/31/23 0534 02/01/23 0445 02/03/23 0428 02/04/23 0537  AST 51* 39 26 22  ALT 37 34 25 19  ALKPHOS 166* 159* 139* 123  BILITOT 0.8 0.5 0.3 0.5  PROT 4.4* 4.4* 4.1* 4.3*  ALBUMIN <1.5* <1.5* <1.5* <1.5*    CBG: Recent Labs  Lab  02/05/23 2020 02/06/23 0033 02/06/23 0410 02/06/23 0951 02/06/23 1150  GLUCAP 144* 146* 186* 195* 172*     Recent Results (from the past 240 hour(s))  Calprotectin, Fecal     Status: Abnormal   Collection Time: 01/30/23  3:31 AM   Specimen: Stool  Result Value Ref Range Status   Calprotectin, Fecal 562 (H) 0 - 120 ug/g Final    Comment: (NOTE) Concentration     Interpretation   Follow-Up < 5 - 50 ug/g     Normal           None >50 -120 ug/g     Borderline       Re-evaluate in 4-6 weeks    >120 ug/g     Abnormal         Repeat as clinically  indicated Performed At: [facility] [address] [place], [place] [id number] Jolene Schimke MD WU:[id number]   Gastrointestinal Panel by PCR , Stool     Status: None   Collection Time: 01/30/23  3:31 AM   Specimen: Stool  Result Value Ref Range Status   Campylobacter species NOT DETECTED NOT DETECTED Final   Plesimonas shigelloides NOT DETECTED NOT DETECTED Final   Salmonella species NOT DETECTED NOT DETECTED Final   Yersinia enterocolitica NOT DETECTED NOT DETECTED Final   Vibrio species NOT DETECTED NOT DETECTED Final   Vibrio cholerae NOT DETECTED NOT DETECTED Final   Enteroaggregative E coli (EAEC) NOT DETECTED NOT DETECTED Final   Enteropathogenic E coli (EPEC) NOT DETECTED NOT DETECTED Final   Enterotoxigenic E coli (ETEC) NOT DETECTED NOT DETECTED Final   Shiga like toxin producing E coli (STEC) NOT DETECTED NOT DETECTED Final   Shigella/Enteroinvasive E coli (EIEC) NOT DETECTED NOT DETECTED Final   Cryptosporidium NOT DETECTED NOT DETECTED Final   Cyclospora cayetanensis NOT DETECTED NOT DETECTED Final   Entamoeba histolytica NOT DETECTED NOT DETECTED Final   Giardia lamblia NOT DETECTED NOT DETECTED Final   Adenovirus F40/41 NOT DETECTED NOT DETECTED Final   Astrovirus NOT DETECTED NOT DETECTED Final   Norovirus GI/GII NOT DETECTED NOT DETECTED Final   Rotavirus A NOT DETECTED NOT  DETECTED Final   Sapovirus (I, II, IV, and V) NOT DETECTED NOT DETECTED Final    Comment: Performed at [facility], [address] [place], [place] [address]  C Difficile Quick Screen w PCR reflex     Status: Abnormal   Collection Time: 01/30/23  4:34 AM   Specimen: Stool  Result Value Ref Range Status   C Diff antigen POSITIVE (A) NEGATIVE Final   C Diff toxin NEGATIVE NEGATIVE Final   C Diff interpretation Results are indeterminate. See PCR results.  Final    Comment: Performed at [facility], [address], [place], [place] [address]  C. Diff by PCR, Reflexed     Status: Abnormal   Collection Time: 01/30/23  4:34 AM  Result Value Ref Range Status   Toxigenic C. Difficile by PCR POSITIVE (A) NEGATIVE Final    Comment: Positive for toxigenic C. difficile with little to no toxin production. Only treat if clinical presentation suggests symptomatic illness. Performed at [facility], [address] [place], [place] [address]          Radiology Studies: [place] EKG SITE RITE  Result Date: 02/06/2023 If Site Rite image not attached, placement could not be confirmed due to current cardiac rhythm.       Scheduled Meds:  sodium chloride   Intravenous Once   Chlorhexidine Gluconate Cloth  6 each Topical Daily   feeding supplement (PROSource TF20)  60 mL Per Tube Daily   fiber supplement (BANATROL TF)  60 mL Per Tube BID   Gerhardt's butt cream   Topical BID   heparin injection (subcutaneous)  5,000 Units Subcutaneous Q8H   insulin aspart  0-9 Units Subcutaneous Q4H   insulin glargine-yfgn  5 Units Subcutaneous BID   levETIRAcetam  750 mg Per Tube BID   metoprolol tartrate  50 mg Oral BID   metroNIDAZOLE  500 mg Per Tube Q12H   mouth rinse  15 mL Mouth Rinse 4 times per day   pantoprazole (PROTONIX) IV  40 mg Intravenous Q12H   thiamine  100 mg Per Tube Daily   vancomycin  125 mg Per Tube  Q6H   Continuous Infusions:  sodium chloride Stopped (02/02/23  0725)   albumin human 25 g (02/06/23 1002)   feeding supplement (OSMOLITE 1.2 CAL) 1,000 mL (02/03/23 1322)     LOS: 10 days      Huey Bienenstock, MD [facility]   To contact the attending provider between 7A-7P or the covering provider during after hours 7P-7A, please log into the web site [email or web address] and access using universal [place] password for that web site. If you do not have the password, please call the hospital operator.  02/06/2023, 11:56 AM

## 2023-02-07 ENCOUNTER — Inpatient Hospital Stay (HOSPITAL_COMMUNITY): Payer: Medicaid Other

## 2023-02-07 ENCOUNTER — Encounter (HOSPITAL_COMMUNITY): Payer: Self-pay | Admitting: Internal Medicine

## 2023-02-07 DIAGNOSIS — M7989 Other specified soft tissue disorders: Secondary | ICD-10-CM | POA: Diagnosis not present

## 2023-02-07 DIAGNOSIS — I4711 Inappropriate sinus tachycardia, so stated: Secondary | ICD-10-CM | POA: Diagnosis not present

## 2023-02-07 DIAGNOSIS — R000 Tachycardia, unspecified: Secondary | ICD-10-CM

## 2023-02-07 DIAGNOSIS — R7989 Other specified abnormal findings of blood chemistry: Secondary | ICD-10-CM | POA: Diagnosis not present

## 2023-02-07 DIAGNOSIS — I469 Cardiac arrest, cause unspecified: Secondary | ICD-10-CM | POA: Diagnosis not present

## 2023-02-07 LAB — BASIC METABOLIC PANEL
Anion gap: 7 (ref 5–15)
BUN: 6 mg/dL (ref 6–20)
CO2: 24 mmol/L (ref 22–32)
Calcium: 8.4 mg/dL — ABNORMAL LOW (ref 8.9–10.3)
Chloride: 101 mmol/L (ref 98–111)
Creatinine, Ser: 0.47 mg/dL (ref 0.44–1.00)
GFR, Estimated: >60 mL/min (ref >60)
Glucose, Bld: 136 mg/dL — ABNORMAL HIGH (ref 70–99)
Potassium: 4 mmol/L (ref 3.5–5.1)
Sodium: 132 mmol/L — ABNORMAL LOW (ref 135–145)

## 2023-02-07 LAB — GLUCOSE, CAPILLARY
Glucose-Capillary: 123 mg/dL — ABNORMAL HIGH (ref 70–99)
Glucose-Capillary: 125 mg/dL — ABNORMAL HIGH (ref 70–99)
Glucose-Capillary: 128 mg/dL — ABNORMAL HIGH (ref 70–99)
Glucose-Capillary: 141 mg/dL — ABNORMAL HIGH (ref 70–99)
Glucose-Capillary: 157 mg/dL — ABNORMAL HIGH (ref 70–99)
Glucose-Capillary: 157 mg/dL — ABNORMAL HIGH (ref 70–99)

## 2023-02-07 LAB — CBC
HCT: 29.6 % — ABNORMAL LOW (ref 36.0–46.0)
Hemoglobin: 9.3 g/dL — ABNORMAL LOW (ref 12.0–15.0)
MCH: 27.3 pg (ref 26.0–34.0)
MCHC: 31.4 g/dL (ref 30.0–36.0)
MCV: 86.8 fL (ref 80.0–100.0)
Platelets: 823 10*3/uL — ABNORMAL HIGH (ref 150–400)
RBC: 3.41 MIL/uL — ABNORMAL LOW (ref 3.87–5.11)
RDW: 15.5 % (ref 11.5–15.5)
WBC: 14.2 10*3/uL — ABNORMAL HIGH (ref 4.0–10.5)
nRBC: 0 % (ref 0.0–0.2)

## 2023-02-07 LAB — ECHOCARDIOGRAM COMPLETE
Est EF: >75
Height: 62.99 in
S' Lateral: 1.8 cm
Weight: 2465.62 [oz_av]

## 2023-02-07 LAB — MAGNESIUM: Magnesium: 1.8 mg/dL (ref 1.7–2.4)

## 2023-02-07 LAB — PHOSPHORUS: Phosphorus: 3.4 mg/dL (ref 2.5–4.6)

## 2023-02-07 LAB — PROCALCITONIN: Procalcitonin: 0.36 ng/mL

## 2023-02-07 LAB — C-REACTIVE PROTEIN: CRP: 13.4 mg/dL — ABNORMAL HIGH (ref <1.0)

## 2023-02-07 LAB — D-DIMER, QUANTITATIVE: D-Dimer, Quant: 8.47 ug{FEU}/mL — ABNORMAL HIGH (ref 0.00–0.50)

## 2023-02-07 MED ORDER — ALPRAZOLAM 0.25 MG PO TABS
0.2500 mg | ORAL_TABLET | Freq: Every evening | ORAL | Status: DC | PRN
  Administered 2023-02-07 – 2023-02-11 (×2): 0.25 mg via ORAL
  Filled 2023-02-07 (×2): qty 1

## 2023-02-07 MED ORDER — PANTOPRAZOLE SODIUM 40 MG IV SOLR: 40.0000 mg | INTRAVENOUS | Status: DC

## 2023-02-07 MED ORDER — MAGNESIUM SULFATE IN D5W 1-5 GM/100ML-% IV SOLN
1.0000 g | Freq: Once | INTRAVENOUS | Status: AC
  Administered 2023-02-07: 1 g via INTRAVENOUS
  Filled 2023-02-07 (×2): qty 100

## 2023-02-07 MED ORDER — ALBUMIN HUMAN 25 % IV SOLN
25.0000 g | Freq: Four times a day (QID) | INTRAVENOUS | Status: AC
  Administered 2023-02-07 (×2): 25 g via INTRAVENOUS
  Filled 2023-02-07 (×2): qty 100

## 2023-02-07 MED ORDER — IOHEXOL 350 MG/ML SOLN
75.0000 mL | Freq: Once | INTRAVENOUS | Status: AC | PRN
  Administered 2023-02-07: 75 mL via INTRAVENOUS

## 2023-02-07 MED ORDER — PANTOPRAZOLE SODIUM 40 MG PO TBEC
40.0000 mg | DELAYED_RELEASE_TABLET | Freq: Every day | ORAL | Status: DC
  Administered 2023-02-08 – 2023-02-10 (×3): 40 mg via ORAL
  Filled 2023-02-07 (×3): qty 1

## 2023-02-07 MED ORDER — ENSURE ENLIVE PO LIQD
237.0000 mL | Freq: Two times a day (BID) | ORAL | Status: DC
  Administered 2023-02-09 (×2): 237 mL via ORAL

## 2023-02-07 NOTE — Progress Notes (Signed)
Speech Language Pathology Treatment: Dysphagia;Cognitive-Linquistic  Patient Details Name: [name] MRN: [id number] DOB: [birth year] Today's Date: 02/07/2023 Time: 4010-2725 SLP Time Calculation (min) (ACUTE ONLY): 12 min  Assessment / Plan / Recommendation Clinical Impression  Pt seen for cognition and swallow. She was observed with Dys 3 texture, thin liquids, No s/s aspiration with straw sips thin throughout session and swallow appeared timely. Masticated without significant delays or residue. Pt has been on full liquids and recommend to advance slowly to Dys 2, continue thin, straws allowed, and meds whole in puree. Pt may be able to feed herself but may need assistance for set up, initiation and encouragement to eat. She was oriented to day of week, place and month. She was able to problem solve using her call bell and demonstrated for therapist. Pt with decreased awareness of intellectual and anticipatory awareness of needs re: walking and overall ability to perform ADL's as she did prior to hospitalization. ST to continue for swallow and cognition.    HPI HPI: 52 yo female admitted 9/26 after found down for an unspecified amount of time with possible GIB, agonal breathing and pulseless with CPR 5 min. Intubated in [facility] ED and transferred to [facility]. Pt with AKI, elevated lactate, septic shock. 10/2 extubated and EGD. PMhx: COPD, chron's, HTN, DM, PTSD, seizure disorder, chronic back pain      SLP Plan  Continue with current plan of care      Recommendations for follow up therapy are one component of a multi-disciplinary discharge planning process, led by the attending physician.  Recommendations may be updated based on patient status, additional functional criteria and insurance authorization.    Recommendations  Diet recommendations: Dysphagia 2 (fine chop);Thin liquid Liquids provided via: Cup;Straw Medication Administration: Whole meds with puree Supervision: Patient able to self  feed (may need set up) Compensations: Slow rate;Small sips/bites Postural Changes and/or Swallow Maneuvers: Seated upright 90 degrees                  Oral care BID   Frequent or constant Supervision/Assistance Dysphagia, unspecified (R13.10);Cognitive communication deficit (D66.440)     Continue with current plan of care     Royce Macadamia  02/07/2023, 9:48 AM

## 2023-02-07 NOTE — Progress Notes (Signed)
Bilateral lower extremity venous duplex has been completed. Preliminary results can be found in CV Proc through chart review.   02/07/23 1:30 PM Olen Cordial RVT

## 2023-02-07 NOTE — Progress Notes (Signed)
TRH night cross cover note:   I was notified by RN of the patient's request for prn Xanax for sleep.  I subsequently ordered Xanax 0.25 mg p.o. nightly as needed for insomnia.     Newton Pigg, DO Hospitalist

## 2023-02-07 NOTE — Progress Notes (Signed)
   02/06/23 1941  Assess: MEWS Score  Temp 98 F (36.7 C)  BP 134/61  MAP (mmHg) 82  Pulse Rate (!) 130  ECG Heart Rate (!) 135  Resp (!) 22  Level of Consciousness Alert  SpO2 98 %  O2 Device Room Air  Assess: MEWS Score  MEWS Temp 0  MEWS Systolic 0  MEWS Pulse 3  MEWS RR 1  MEWS LOC 0  MEWS Score 4  MEWS Score Color Red  Assess: if the MEWS score is Yellow or Red  Were vital signs accurate and taken at a resting state? Yes  Does the patient meet 2 or more of the SIRS criteria? Yes  Does the patient have a confirmed or suspected source of infection? Yes  MEWS guidelines implemented  Yes, red  Treat  MEWS Interventions Considered administering scheduled or prn medications/treatments as ordered  Take Vital Signs  Increase Vital Sign Frequency  Red: Q1hr x2, continue Q4hrs until patient remains green for 12hrs  Escalate  MEWS: Escalate Red: Discuss with charge nurse and notify provider. Consider notifying RRT. If remains red for 2 hours consider need for higher level of care  Notify: Charge Nurse/RN  Name of Charge Nurse/RN Notified Lowella Bandy, RN  Provider Notification  Provider Name/Title Dr. Arlean Hopping  Date Provider Notified 02/06/23  Time Provider Notified 2000  Method of Notification  (secure chat)  Notification Reason Other (Comment) (Red mews)  Provider response Other (Comment) (through secure chat)  Date of Provider Response 02/06/23  Time of Provider Response 2005  Assess: SIRS CRITERIA  SIRS Temperature  0  SIRS Pulse 1  SIRS Respirations  1  SIRS WBC 0  SIRS Score Sum  2

## 2023-02-07 NOTE — Plan of Care (Signed)

## 2023-02-07 NOTE — Progress Notes (Signed)
   02/07/23 1950  Assess: MEWS Score  Temp 98.1 F (36.7 C)  BP (!) 123/52  MAP (mmHg) 71  Pulse Rate (!) 133  ECG Heart Rate (!) 133  Resp (!) 22  Level of Consciousness Alert  SpO2 98 %  O2 Device Room Air  Assess: MEWS Score  MEWS Temp 0  MEWS Systolic 0  MEWS Pulse 3  MEWS RR 1  MEWS LOC 0  MEWS Score 4  MEWS Score Color Red  Assess: if the MEWS score is Yellow or Red  Were vital signs accurate and taken at a resting state? Yes  Does the patient meet 2 or more of the SIRS criteria? Yes  Does the patient have a confirmed or suspected source of infection? Yes  MEWS guidelines implemented  No, previously yellow, continue vital signs every 4 hours  Assess: SIRS CRITERIA  SIRS Temperature  0  SIRS Pulse 1  SIRS Respirations  1  SIRS WBC 0  SIRS Score Sum  2   Patient's been sinus tachy.Provider's aware.

## 2023-02-07 NOTE — Progress Notes (Signed)
Physical Therapy Treatment Patient Details Name: [name] MRN: [id number] DOB: [birth year] Today's Date: 02/07/2023   History of Present Illness 52 yo female admitted 9/26 after found down for an unspecified amount of time with possible GIB, agonal breathing and pulseless with CPR 5 min. Intubated in [name] ED and transferred to [name]. Pt with AKI, elevated lactate, septic shock. 10/2 extubated and EGD. PMhx: COPD, chron's, HTN, DM, PTSD, seizure disorder, chronic back pain    PT Comments  Patient not oriented to specific hospital or month. Follows commands with incr time and at times repetition. Overall mod +2 for bed mobility and min assist for sit to stand transfers. DIscharge recommendation remains appropriate.     If plan is discharge home, recommend the following: A lot of help with bathing/dressing/bathroom;Two people to help with walking and/or transfers;Assistance with cooking/housework;Direct supervision/assist for medications management;Assist for transportation;Supervision due to cognitive status;Direct supervision/assist for financial management;Help with stairs or ramp for entrance   Can travel by private vehicle     No  Equipment Recommendations  Rolling walker (2 wheels);BSC/3in1;Wheelchair (measurements PT);Wheelchair cushion (measurements PT)    Recommendations for Other Services       Precautions / Restrictions Precautions Precautions: Fall;Other (comment) Precaution Comments: watch HR and SPO2, flexiseal Restrictions Weight Bearing Restrictions: No     Mobility  Bed Mobility Overal bed mobility: Needs Assistance Bed Mobility: Rolling, Sidelying to Sit Rolling: Min assist, Used rails Sidelying to sit: +2 for physical assistance, Mod assist, Used rails, HOB elevated       General bed mobility comments: pt requires cues for all steps of the process; assist to fully roll onto her side, to take legs over EOB, and to raise torso    Transfers Overall transfer  level: Needs assistance Equipment used: Rolling walker (2 wheels) Transfers: Sit to/from Stand, Bed to chair/wheelchair/BSC Sit to Stand: Min assist   Step pivot transfers: Min assist, +2 safety/equipment       General transfer comment: pt doing better and second person for lines/safety only; pt required help directing/steering RW    Ambulation/Gait               General Gait Details: not yet able; very fatigued after 3 sit to stands and pivot to chair   Stairs             Wheelchair Mobility     Tilt Bed    Modified Rankin (Stroke Patients Only)       Balance Overall balance assessment: Needs assistance Sitting-balance support: Feet supported, Bilateral upper extremity supported Sitting balance-Leahy Scale: Fair Sitting balance - Comments: EOB with guarding   Standing balance support: Bilateral upper extremity supported, Reliant on assistive device for balance, During functional activity Standing balance-Leahy Scale: Poor Standing balance comment: UB support on RW in standing                            Cognition Arousal: Alert Behavior During Therapy: Flat affect Overall Cognitive Status: Impaired/Different from baseline Area of Impairment: Orientation, Attention, Memory, Following commands, Safety/judgement                 Orientation Level: Disoriented to, Situation, Place, Time Current Attention Level: Sustained Memory: Decreased short-term memory Following Commands: Follows one step commands inconsistently, Follows one step commands with increased time Safety/Judgement: Decreased awareness of deficits, Decreased awareness of safety     General Comments: slow to follow commands and requiring repetition at times  Exercises      General Comments General comments (skin integrity, edema, etc.): resting HR 135; max HR 148      Pertinent Vitals/Pain Pain Assessment Pain Assessment: Faces Faces Pain Scale: Hurts  little more Pain Location: buttocks Pain Descriptors / Indicators: Guarding Pain Intervention(s): Limited activity within patient's tolerance, Repositioned (pillow in chait)    Home Living                          Prior Function            PT Goals (current goals can now be found in the care plan section) Acute Rehab PT Goals Patient Stated Goal: go home Time For Goal Achievement: 02/17/23 Potential to Achieve Goals: Good Progress towards PT goals: Progressing toward goals    Frequency    Min 1X/week      PT Plan      Co-evaluation              AM-PAC PT "6 Clicks" Mobility   Outcome Measure  Help needed turning from your back to your side while in a flat bed without using bedrails?: A Little Help needed moving from lying on your back to sitting on the side of a flat bed without using bedrails?: Total Help needed moving to and from a bed to a chair (including a wheelchair)?: A Little Help needed standing up from a chair using your arms (e.g., wheelchair or bedside chair)?: A Little Help needed to walk in hospital room?: Total Help needed climbing 3-5 steps with a railing? : Total 6 Click Score: 12    End of Session Equipment Utilized During Treatment: Gait belt Activity Tolerance: Patient tolerated treatment well Patient left: in chair;with call bell/phone within reach;with chair alarm set Nurse Communication: Mobility status PT Visit Diagnosis: Other abnormalities of gait and mobility (R26.89);Muscle weakness (generalized) (M62.81)     Time: 1478-2956 PT Time Calculation (min) (ACUTE ONLY): 23 min  Charges:    $Gait Training: 23-37 mins PT General Charges $$ ACUTE PT VISIT: 1 Visit                      [name], PT Acute Rehabilitation Services  Office [phone]    [name] 02/07/2023, 3:11 PM

## 2023-02-07 NOTE — Progress Notes (Signed)
PROGRESS NOTE    [name]  ZOX:[id number] DOB: [birth year] DOA: 01/27/2023 PCP: [name], PA-C    Chief Complaint  Patient presents with   Respiratory Arrest    Brief Narrative:   This is a 52 year old female with past medical history of COPD, Crohn's on Skyrizi, hypertension, type 2 diabetes mellitus who initially presented to the after being found unresponsive. Per family, patient has had bloody emesis with diarrhea over the past few days. Initial systolics in the 60s in the ED, and patient was minimally responsive. In the emergency room, patient was intubated, and patient was admitted to the ICU for further evaluation and management for shock and altered mental status. She had concern for ongoing colitis. Patient was taken for flexible sigmoidoscopy by GI yesterday. This revealed mucosal erosions consistent with her history of Crohn's. They started her on vancomycin with her history of C. difficile and decided to hold off on steroids.  -Improved and transferred to [facility] service-progressive care 10//2024.  Assessment & Plan:   Principal Problem:   Cardiac arrest [facility]) Active Problems:   Metabolic encephalopathy   Shock (HCC)   Acute respiratory failure with hypoxia (HCC)   Upper GI bleed   Granulomatous colitis (HCC)   Pseudomembranous colitis   Crohn's disease without complication (HCC)       Cardiac arrest -Required intubation on presentation, She is successfully extubated, appears to be improving   Infectious colitis/membranous colitis due to C. Difficile - flexible sigmoidoscopy on 10/2 with severe colitis with pseudomembranous features. -Despite negative C. difficile toxin, but positive antigen, but clinical sensations and findings on sigmoidoscopy, concerning for C. difficile colitis, started on p.o. vancomycin -She finished total 10 days of IV Rocephin and Flagyl. -Vancomycin day 6 of 14 -GI following, appreciate recommendations   Type 2 diabetes  mellitus.  -Continue with insulin sliding scale for now -CBG remains significantly elevated, continue with insulin sliding scale but will add Semglee 5 units twice daily   History of seizures Still on Keppra.  No concern for seizure-like activity.  Doing well. -Continue Keppra   Anxiety Given altered mental status, will continue to hold Xanax.   Hypertension -Blood pressure started to increase, will go ahead and increase her metoprolol further to 50 mg p.o. twice daily especially with her sinus tachycardia.  Will resume low-dose metoprolol (she is on metoprolol tartrate 25 mg twice daily at home)  Sinus tachycardia -Remains with significant sinus tachycardia despite starting on good dose of beta-blockers, TSH is reassuring at 4.878, I checked her D-dimer significantly elevated at 8, will check Dopplers, will check CTA chest to rule out PE.    Elevated D-dimers  - CTA chest PE protocol and venous Dopplers are pending  Prolonged QTc - Will repeat EKG, keep magnesium >2 and potassium> 4   Acute encephalopathy Dysphagia -Patient remains with slurred speech, mild confusion, but mentation appears to continue to improve, most likely in the setting of septic shock, postcardiac arrest, will consult SLP regarding cognition/speech evaluation -SLP following closely    COPD No concern for exacerbation at this time.  Will continue with as needed albuterol.   Nutrition PCM -She remains on tube feed, started on a diet, but oral intake remains poor, so continue with tube feed for now -Albumin significantly low, she is with anasarca, started on IV albumin  DVT prophylaxis: Heparin Code Status: Full code Family communication: Discussed with daughter by phone 10/5, 10/7 Disposition:   Status is: Inpatient    Consultants:  PCCM Gastroenterology  Subjective:  Significant events overnight as discussed with staff, she denies any complaints today to me, but apparently she had some nausea  upon trying dysphagia 2 diet.  Objective: Vitals:   02/07/23 0530 02/07/23 0758 02/07/23 0800 02/07/23 1100  BP: 102/62 (!) 106/42 (!) 106/42 (!) 111/51  Pulse: (!) 130 (!) 138 (!) 137 (!) 120  Resp: (!) 22  (!) 28 (!) 23  Temp: 98.3 F (36.8 C)   98.6 F (37 C)  TempSrc: Oral   Oral  SpO2: 94%  97% 100%  Weight:      Height:        Intake/Output Summary (Last 24 hours) at 02/07/2023 1252 Last data filed at 02/07/2023 0433 Gross per 24 hour  Intake --  Output 1850 ml  Net -1850 ml   Filed Weights   02/05/23 0500 02/06/23 0437 02/07/23 0500  Weight: 72.6 kg 69.5 kg 69.9 kg    Examination:   Awake Alert, Oriented X 3, Simliya frail, deconditioned, but mentation much improved Symmetrical Chest wall movement, Good air movement bilaterally, CTAB Tachycardic,No Gallops,Rubs or new Murmurs, No Parasternal Heave +ve B.Sounds, Abd Soft, No tenderness, No rebound - guarding or rigidity. No Cyanosis, Clubbing or edema, No new Rash or bruise         Data Reviewed: I have personally reviewed following labs and imaging studies  CBC: Recent Labs  Lab 02/02/23 0413 02/03/23 0428 02/04/23 0537 02/05/23 1720 02/07/23 0746  WBC 16.8* 18.6* 18.6* 17.4* 14.2*  HGB 10.9* 10.5* 10.7* 11.8* 9.3*  HCT 33.4* 33.0* 33.1* 37.1 29.6*  MCV 86.8 85.7 85.5 87.7 86.8  PLT 207 294 376 482* 823*    Basic Metabolic Panel: Recent Labs  Lab 01/31/23 1631 01/31/23 1631 02/01/23 0445 02/02/23 0413 02/03/23 0428 02/04/23 0537 02/05/23 1720 02/07/23 0746  NA  --    < > 143 143 140 137 134* 132*  K  --    < > 3.8 4.0 3.7 4.2 4.1 4.0  CL  --    < > 109 110 109 107 104 101  CO2  --    < > 25 27 23  19* 22 24  GLUCOSE  --    < > 160* 125* 179* 200* 229* 136*  BUN  --    < > 10 8 8 11 12 6   CREATININE  --    < > 0.60 0.53 0.62 0.54 0.57 0.47  CALCIUM  --    < > 7.6* 7.5* 7.2* 7.3* 7.8* 8.4*  MG 2.1  --  2.0 2.0 1.7  --  2.0 1.8  PHOS 2.4*  --  2.4* 2.9  --   --  3.0 3.4   < > = values  in this interval not displayed.    GFR: Estimated Creatinine Clearance: 76.3 mL/min (by C-G formula based on SCr of 0.47 mg/dL).  Liver Function Tests: Recent Labs  Lab 02/01/23 0445 02/03/23 0428 02/04/23 0537  AST 39 26 22  ALT 34 25 19  ALKPHOS 159* 139* 123  BILITOT 0.5 0.3 0.5  PROT 4.4* 4.1* 4.3*  ALBUMIN <1.5* <1.5* <1.5*    CBG: Recent Labs  Lab 02/06/23 1543 02/06/23 2027 02/07/23 0002 02/07/23 0404 02/07/23 0756  GLUCAP 117* 166* 128* 157* 125*     Recent Results (from the past 240 hour(s))  Calprotectin, Fecal     Status: Abnormal   Collection Time: 01/30/23  3:31 AM   Specimen: Stool  Result Value Ref Range Status   Calprotectin,  Fecal 562 (H) 0 - 120 ug/g Final    Comment: (NOTE) Concentration     Interpretation   Follow-Up < 5 - 50 ug/g     Normal           None >50 -120 ug/g     Borderline       Re-evaluate in 4-6 weeks    >120 ug/g     Abnormal         Repeat as clinically                                   indicated Performed At: [facility] [address] [place], [place] [id number] Jolene Schimke MD XL:[phone]   Gastrointestinal Panel by PCR , Stool     Status: None   Collection Time: 01/30/23  3:31 AM   Specimen: Stool  Result Value Ref Range Status   Campylobacter species NOT DETECTED NOT DETECTED Final   Plesimonas shigelloides NOT DETECTED NOT DETECTED Final   Salmonella species NOT DETECTED NOT DETECTED Final   Yersinia enterocolitica NOT DETECTED NOT DETECTED Final   Vibrio species NOT DETECTED NOT DETECTED Final   Vibrio cholerae NOT DETECTED NOT DETECTED Final   Enteroaggregative E coli (EAEC) NOT DETECTED NOT DETECTED Final   Enteropathogenic E coli (EPEC) NOT DETECTED NOT DETECTED Final   Enterotoxigenic E coli (ETEC) NOT DETECTED NOT DETECTED Final   Shiga like toxin producing E coli (STEC) NOT DETECTED NOT DETECTED Final   Shigella/Enteroinvasive E coli (EIEC) NOT DETECTED NOT DETECTED Final   Cryptosporidium NOT  DETECTED NOT DETECTED Final   Cyclospora cayetanensis NOT DETECTED NOT DETECTED Final   Entamoeba histolytica NOT DETECTED NOT DETECTED Final   Giardia lamblia NOT DETECTED NOT DETECTED Final   Adenovirus F40/41 NOT DETECTED NOT DETECTED Final   Astrovirus NOT DETECTED NOT DETECTED Final   Norovirus GI/GII NOT DETECTED NOT DETECTED Final   Rotavirus A NOT DETECTED NOT DETECTED Final   Sapovirus (I, II, IV, and V) NOT DETECTED NOT DETECTED Final    Comment: Performed at [facility], [address] [place], [place] [address]  C Difficile Quick Screen w PCR reflex     Status: Abnormal   Collection Time: 01/30/23  4:34 AM   Specimen: Stool  Result Value Ref Range Status   C Diff antigen POSITIVE (A) NEGATIVE Final   C Diff toxin NEGATIVE NEGATIVE Final   C Diff interpretation Results are indeterminate. See PCR results.  Final    Comment: Performed at [facility], [address], [place], [place] [address]  C. Diff by PCR, Reflexed     Status: Abnormal   Collection Time: 01/30/23  4:34 AM  Result Value Ref Range Status   Toxigenic C. Difficile by PCR POSITIVE (A) NEGATIVE Final    Comment: Positive for toxigenic C. difficile with little to no toxin production. Only treat if clinical presentation suggests symptomatic illness. Performed at [facility], [address], [place], [place] [address]          Radiology Studies: [place] EKG SITE RITE  Result Date: 02/06/2023 If Site Rite image not attached, placement could not be confirmed due to current cardiac rhythm.       Scheduled Meds:  sodium chloride   Intravenous Once   Chlorhexidine Gluconate Cloth  6 each Topical Daily   feeding supplement  237 mL Oral BID BM  feeding supplement (PROSource TF20)  60 mL Per Tube Daily   fiber supplement (BANATROL TF)  60 mL Per Tube BID   Gerhardt's butt cream   Topical BID   heparin injection (subcutaneous)  5,000 Units Subcutaneous Q8H   insulin aspart  0-9 Units  Subcutaneous Q4H   insulin glargine-yfgn  5 Units Subcutaneous BID   levETIRAcetam  750 mg Per Tube BID   metoprolol tartrate  50 mg Oral BID   mouth rinse  15 mL Mouth Rinse 4 times per day   [START ON 02/08/2023] pantoprazole (PROTONIX) IV  40 mg Intravenous Q24H   vancomycin  125 mg Per Tube Q6H   Continuous Infusions:  sodium chloride Stopped (02/02/23 0725)   albumin human 25 g (02/07/23 0610)   feeding supplement (OSMOLITE 1.2 CAL) 1,000 mL (02/07/23 0428)     LOS: 11 days      Huey Bienenstock, MD [facility]   To contact the attending provider between 7A-7P or the covering provider during after hours 7P-7A, please log into the web site [email or web address] and access using universal [place] password for that web site. If you do not have the password, please call the hospital operator.  02/07/2023, 12:52 PM

## 2023-02-07 NOTE — Plan of Care (Signed)
  Problem: Education: Goal: Knowledge of General Education information will improve Description: Including pain rating scale, medication(s)/side effects and non-pharmacologic comfort measures Outcome: Progressing   Problem: Clinical Measurements: Goal: Ability to maintain clinical measurements within normal limits will improve Outcome: Progressing Goal: Will remain free from infection Outcome: Progressing Goal: Respiratory complications will improve Outcome: Progressing   Problem: Activity: Goal: Risk for activity intolerance will decrease Outcome: Progressing   Problem: Coping: Goal: Level of anxiety will decrease Outcome: Progressing   Problem: Safety: Goal: Ability to remain free from injury will improve Outcome: Progressing   Problem: Skin Integrity: Goal: Risk for impaired skin integrity will decrease Outcome: Progressing

## 2023-02-07 NOTE — TOC Progression Note (Signed)
Transition of Care [facility]) - Progression Note    Patient Details  Name: [name] MRN: [id number] Date of Birth: 1970-09-18  [facility]) CM/SW Contact  Mearl Latin, LCSW Phone Number: 02/07/2023, 10:44 AM  Clinical Narrative:    CSW continuing to follow. [place] remains in place.         Expected Discharge Plan and Services                                               Social Determinants of Health (SDOH) Interventions SDOH Screenings   Food Insecurity: Food Insecurity Present (10/18/2022)   Received from [facility]  Housing: Low Risk  (01/16/2022)  Transportation Needs: Unmet Transportation Needs (10/18/2022)   Received from [facility]  Utilities: Not At Risk (10/18/2022)   Received from [facility]  Financial Resource Strain: Medium Risk (10/18/2022)   Received from [facility]  Social Connections: Unknown (09/01/2021)   Received from [facility], [facility]  Stress: Stress Concern Present (06/25/2020)   Received from [facility], [facility]  Tobacco Use: High Risk (02/02/2023)    Readmission Risk Interventions     No data to display

## 2023-02-07 NOTE — Progress Notes (Signed)
Patient ID: [name], female   DOB: July 08, 1970, 52 y.o.   MRN: [id number]    Progress Note   Subjective   Day # 12 CC; shock, hx of Crohns  Suspected C. Difficile  Oral vancomycin day #5 Second dose of Skyrizi 01/14/2023  Flexible sigmoidoscopy 02/02/2023-liquid stool in the rectosigmoid and sigmoid colon, distal diffuse severe mucosal changes found in the rectum, rectosigmoid and distal sigmoid consistent with Crohn's disease and likely infectious colitis, pseudomembranes present, no specimens collected  Tube feedings 60 cc/h  CRP 13.4 /hemoglobin 9.3/hematocrit 29.6 WBC 14.2 D-dimer 8.47  Patient says she tried to eat some solid food at lunchtime and developed dry heaves, this was her first day of attempting solid food, continues to have abdominal pain though not severe, Flexi-Seal in place, liquid brown stool     Objective   Vital signs in last 24 hours: Temp:  [98 F (36.7 C)-98.7 F (37.1 C)] 98.3 F (36.8 C) (10/07 0530) Pulse Rate:  [109-138] 137 (10/07 0800) Resp:  [20-28] 28 (10/07 0800) BP: (102-134)/(42-62) 106/42 (10/07 0800) SpO2:  [94 %-100 %] 97 % (10/07 0800) Weight:  [69.9 kg] 69.9 kg (10/07 0500) Last BM Date : 02/06/23 General:   Older white female in NAD, pleasant, mentating appropriately Heart:  Regular rate and rhythm; no murmurs Lungs: Respirations even and unlabored, lungs CTA bilaterally Abdomen:  Soft, bowel sounds present rather generalized tenderness, no rebound Extremities:  Without edema. Neurologic:  Alert and oriented,  grossly normal neurologically. Psych:  Cooperative. Normal mood and affect.  Intake/Output from previous day: 10/06 0701 - 10/07 0700 In: 440 [P.O.:240; NG/GT:200] Out: 4100 [Urine:2050; Stool:2050] Intake/Output this shift: No intake/output data recorded.  Lab Results: Recent Labs    02/05/23 1720 02/07/23 0746  WBC 17.4* 14.2*  HGB 11.8* 9.3*  HCT 37.1 29.6*  PLT 482* 823*   BMET Recent Labs     02/05/23 1720 02/07/23 0746  NA 134* 132*  K 4.1 4.0  CL 104 101  CO2 22 24  GLUCOSE 229* 136*  BUN 12 6  CREATININE 0.57 0.47  CALCIUM 7.8* 8.4*   LFT No results for input(s): "PROT", "ALBUMIN", "AST", "ALT", "ALKPHOS", "BILITOT", "BILIDIR", "IBILI" in the last 72 hours. PT/INR No results for input(s): "LABPROT", "INR" in the last 72 hours.  Studies/Results: [place] EKG SITE RITE  Result Date: 02/06/2023 If Site Rite image not attached, placement could not be confirmed due to current cardiac rhythm.      Assessment / Plan:    #48 52 year old white female with history of severe Crohn's disease, complicated by stricture-recently initiated Skyrizi, and admitted 12 days ago with sepsis  Etiology of sepsis is not clear, she did have negative C. difficile toxin though with positive antigen and prior history of sepsis secondary to C. difficile.  He completed a 10-day course of Rocephin/Flagyl There is suspicion for C. difficile, and this was confirmed with flexible sigmoidoscopy on 02/02/2023.  No path was done. Started on vancomycin oral 125- 4 times daily-day #5  Persistent leukocytosis CRP trended down and stable  #2 malnutrition/poor oral intake-had been started on tube feedings while in the ICU with sepsis. Continues at 60 cc/h Attempting to eat solid food but causing nausea with dry heaves  # 3 anemia secondary to above #4 encephalopathy secondary to above improved #5 COPD #6.  Adult onset diabetes mellitus #7.  Seizure disorder  Plan; continue oral vancomycin Patient will be due for Skyrizi this week Continue tube feedings Will back off  on diet to full liquids, add supplements     Principal Problem:   Cardiac arrest [facility]) Active Problems:   Shock (HCC)   Metabolic encephalopathy   Acute respiratory failure with hypoxia (HCC)   Upper GI bleed   Granulomatous colitis (HCC)   Pseudomembranous colitis   Crohn's disease without complication (HCC)     LOS: 11  days   Jodine Muchmore PA-C 02/07/2023, 11:48 AM

## 2023-02-08 ENCOUNTER — Inpatient Hospital Stay (HOSPITAL_COMMUNITY): Payer: Medicaid Other

## 2023-02-08 DIAGNOSIS — G9341 Metabolic encephalopathy: Secondary | ICD-10-CM | POA: Diagnosis not present

## 2023-02-08 DIAGNOSIS — I469 Cardiac arrest, cause unspecified: Secondary | ICD-10-CM | POA: Diagnosis not present

## 2023-02-08 DIAGNOSIS — R000 Tachycardia, unspecified: Secondary | ICD-10-CM | POA: Diagnosis not present

## 2023-02-08 DIAGNOSIS — K5010 Crohn's disease of large intestine without complications: Secondary | ICD-10-CM

## 2023-02-08 DIAGNOSIS — A498 Other bacterial infections of unspecified site: Secondary | ICD-10-CM

## 2023-02-08 DIAGNOSIS — R7989 Other specified abnormal findings of blood chemistry: Secondary | ICD-10-CM | POA: Diagnosis not present

## 2023-02-08 DIAGNOSIS — I3139 Other pericardial effusion (noninflammatory): Secondary | ICD-10-CM

## 2023-02-08 DIAGNOSIS — A0472 Enterocolitis due to Clostridium difficile, not specified as recurrent: Secondary | ICD-10-CM | POA: Diagnosis not present

## 2023-02-08 DIAGNOSIS — K567 Ileus, unspecified: Secondary | ICD-10-CM

## 2023-02-08 LAB — CBC
HCT: 32.5 % — ABNORMAL LOW (ref 36.0–46.0)
HCT: 34.6 % — ABNORMAL LOW (ref 36.0–46.0)
Hemoglobin: 10.4 g/dL — ABNORMAL LOW (ref 12.0–15.0)
Hemoglobin: 11.1 g/dL — ABNORMAL LOW (ref 12.0–15.0)
MCH: 27.5 pg (ref 26.0–34.0)
MCH: 28.3 pg (ref 26.0–34.0)
MCHC: 32 g/dL (ref 30.0–36.0)
MCHC: 32.1 g/dL (ref 30.0–36.0)
MCV: 86 fL (ref 80.0–100.0)
MCV: 88.3 fL (ref 80.0–100.0)
Platelets: 1031 10*3/uL (ref 150–400)
Platelets: 806 10*3/uL — ABNORMAL HIGH (ref 150–400)
RBC: 3.78 MIL/uL — ABNORMAL LOW (ref 3.87–5.11)
RBC: 3.92 MIL/uL (ref 3.87–5.11)
RDW: 15.4 % (ref 11.5–15.5)
RDW: 15.9 % — ABNORMAL HIGH (ref 11.5–15.5)
WBC: 13.1 10*3/uL — ABNORMAL HIGH (ref 4.0–10.5)
WBC: 12.6 10*3/uL — ABNORMAL HIGH (ref 4.0–10.5)
nRBC: 0 % (ref 0.0–0.2)
nRBC: 0.2 % (ref 0.0–0.2)

## 2023-02-08 LAB — BASIC METABOLIC PANEL
Anion gap: 11 (ref 5–15)
BUN: 6 mg/dL (ref 6–20)
CO2: 21 mmol/L — ABNORMAL LOW (ref 22–32)
Calcium: 8.6 mg/dL — ABNORMAL LOW (ref 8.9–10.3)
Chloride: 103 mmol/L (ref 98–111)
Creatinine, Ser: 0.62 mg/dL (ref 0.44–1.00)
GFR, Estimated: >60 mL/min (ref >60)
Glucose, Bld: 156 mg/dL — ABNORMAL HIGH (ref 70–99)
Potassium: 4.3 mmol/L (ref 3.5–5.1)
Sodium: 135 mmol/L (ref 135–145)

## 2023-02-08 LAB — PROCALCITONIN: Procalcitonin: 0.31 ng/mL

## 2023-02-08 LAB — GLUCOSE, CAPILLARY
Glucose-Capillary: 120 mg/dL — ABNORMAL HIGH (ref 70–99)
Glucose-Capillary: 128 mg/dL — ABNORMAL HIGH (ref 70–99)
Glucose-Capillary: 169 mg/dL — ABNORMAL HIGH (ref 70–99)
Glucose-Capillary: 186 mg/dL — ABNORMAL HIGH (ref 70–99)
Glucose-Capillary: 208 mg/dL — ABNORMAL HIGH (ref 70–99)

## 2023-02-08 LAB — MAGNESIUM: Magnesium: 2.2 mg/dL (ref 1.7–2.4)

## 2023-02-08 LAB — PHOSPHORUS: Phosphorus: 3.6 mg/dL (ref 2.5–4.6)

## 2023-02-08 MED ORDER — METOPROLOL SUCCINATE ER 100 MG PO TB24: 100.0000 mg | ORAL_TABLET | Freq: Every evening | ORAL | Status: DC

## 2023-02-08 MED ORDER — FREE WATER
200.0000 mL | Freq: Three times a day (TID) | Status: DC
  Administered 2023-02-08 – 2023-02-09 (×5): 200 mL

## 2023-02-08 MED ORDER — VANCOMYCIN 50 MG/ML ORAL SOLUTION
500.0000 mg | Freq: Four times a day (QID) | ORAL | Status: AC
  Administered 2023-02-08 – 2023-02-12 (×8): 500 mg
  Filled 2023-02-08 (×16): qty 10

## 2023-02-08 MED ORDER — OSMOLITE 1.5 CAL PO LIQD
1000.0000 mL | ORAL | Status: DC
  Administered 2023-02-08: 1000 mL
  Filled 2023-02-08: qty 1000

## 2023-02-08 MED ORDER — METOPROLOL SUCCINATE ER 50 MG PO TB24
50.0000 mg | ORAL_TABLET | Freq: Every evening | ORAL | Status: DC
  Administered 2023-02-08 – 2023-02-10 (×3): 50 mg via ORAL
  Filled 2023-02-08 (×3): qty 1

## 2023-02-08 NOTE — Consult Note (Signed)
Visualized thyroid is unremarkable. No pathologic thoracic adenopathy. Nasoenteric feeding tube extends into the upper abdomen beyond the margin examination. Esophagus unremarkable. Lungs/Pleura: Small right pleural effusion. Mild bibasilar atelectasis. Lungs are otherwise clear. No pneumothorax. No central obstructing lesion. Upper Abdomen: No acute abnormality. Musculoskeletal: No chest wall abnormality. No acute or significant osseous findings. Review of the MIP images confirms the above findings. IMPRESSION: 1. No pulmonary embolism. 2. Small right pleural effusion. 3. Small pericardial effusion. 4. Enlarged central pulmonary arteries in keeping with changes of pulmonary arterial hypertension. Electronically Signed   By: Helyn Numbers M.D.   On: 02/08/2023 00:27   ECHOCARDIOGRAM COMPLETE  Result Date: 02/07/2023    ECHOCARDIOGRAM REPORT   Patient Name:   [name] Date of Exam: 02/07/2023 Medical Rec #:  [id number]     Height:       63.0 in Accession #:    [id number]    Weight:       154.1 lb Date of Birth:  [birth year]     BSA:          1.731 m Patient Age:    52 years      BP:           102/62 mmHg Patient Gender: F             HR:           135 bpm. Exam Location:  Inpatient Procedure: 2D Echo, Color Doppler and Cardiac Doppler Indications:     Sinus Tachycardia  History:        Patient has prior history of Echocardiogram examinations, most                 recent 03/10/2022. Infectious Colitis and COPD,                 Arrythmias:Cardiac Arrest and Tachycardia; Risk Factors:Current                 Smoker.  Sonographer:    Milbert Coulter Referring Phys: [id number] DAWOOD S EL[place]Y IMPRESSIONS  1. Left ventricular ejection fraction, by estimation, is >75%. The left ventricle has hyperdynamic function. The left ventricle has no regional wall motion abnormalities. There is mild concentric left ventricular hypertrophy. Left ventricular diastolic parameters are indeterminate.  2. Right ventricular systolic function is normal. The right ventricular size is normal.  3. Pericardial effusion measuring up to 0.9 cm. Moderate pericardial effusion. There is no evidence of cardiac tamponade.  4. The mitral valve is normal in structure. No evidence of mitral valve regurgitation. No evidence of mitral stenosis.  5. The aortic valve is normal in structure. Aortic valve regurgitation is not visualized. No aortic stenosis is present.  6. The inferior vena cava is normal in size with greater than 50% respiratory variability, suggesting right atrial pressure of 3 mmHg. FINDINGS  Left Ventricle: Left ventricular ejection fraction, by estimation, is >75%. The left ventricle has hyperdynamic function. The left ventricle has no regional wall motion abnormalities. The left ventricular internal cavity size was normal in size. There is mild concentric left ventricular hypertrophy. Left ventricular diastolic parameters are indeterminate. Right Ventricle: The right ventricular size is normal. No increase in right ventricular wall thickness. Right ventricular systolic function is normal. Left Atrium: Left atrial size was normal in size. Right Atrium: Right atrial size was normal in size. Pericardium: Pericardial effusion measuring up to 0.9 cm. A moderately sized pericardial effusion is  present. There is diastolic collapse of the right ventricular free  Visualized thyroid is unremarkable. No pathologic thoracic adenopathy. Nasoenteric feeding tube extends into the upper abdomen beyond the margin examination. Esophagus unremarkable. Lungs/Pleura: Small right pleural effusion. Mild bibasilar atelectasis. Lungs are otherwise clear. No pneumothorax. No central obstructing lesion. Upper Abdomen: No acute abnormality. Musculoskeletal: No chest wall abnormality. No acute or significant osseous findings. Review of the MIP images confirms the above findings. IMPRESSION: 1. No pulmonary embolism. 2. Small right pleural effusion. 3. Small pericardial effusion. 4. Enlarged central pulmonary arteries in keeping with changes of pulmonary arterial hypertension. Electronically Signed   By: Helyn Numbers M.D.   On: 02/08/2023 00:27   ECHOCARDIOGRAM COMPLETE  Result Date: 02/07/2023    ECHOCARDIOGRAM REPORT   Patient Name:   [name] Date of Exam: 02/07/2023 Medical Rec #:  [id number]     Height:       63.0 in Accession #:    [id number]    Weight:       154.1 lb Date of Birth:  [birth year]     BSA:          1.731 m Patient Age:    52 years      BP:           102/62 mmHg Patient Gender: F             HR:           135 bpm. Exam Location:  Inpatient Procedure: 2D Echo, Color Doppler and Cardiac Doppler Indications:     Sinus Tachycardia  History:        Patient has prior history of Echocardiogram examinations, most                 recent 03/10/2022. Infectious Colitis and COPD,                 Arrythmias:Cardiac Arrest and Tachycardia; Risk Factors:Current                 Smoker.  Sonographer:    Milbert Coulter Referring Phys: [id number] DAWOOD S ELGER[place]Y IMPRESSIONS  1. Left ventricular ejection fraction, by estimation, is >75%. The left ventricle has hyperdynamic function. The left ventricle has no regional wall motion abnormalities. There is mild concentric left ventricular hypertrophy. Left ventricular diastolic parameters are indeterminate.  2. Right ventricular systolic function is normal. The right ventricular size is normal.  3. Pericardial effusion measuring up to 0.9 cm. Moderate pericardial effusion. There is no evidence of cardiac tamponade.  4. The mitral valve is normal in structure. No evidence of mitral valve regurgitation. No evidence of mitral stenosis.  5. The aortic valve is normal in structure. Aortic valve regurgitation is not visualized. No aortic stenosis is present.  6. The inferior vena cava is normal in size with greater than 50% respiratory variability, suggesting right atrial pressure of 3 mmHg. FINDINGS  Left Ventricle: Left ventricular ejection fraction, by estimation, is >75%. The left ventricle has hyperdynamic function. The left ventricle has no regional wall motion abnormalities. The left ventricular internal cavity size was normal in size. There is mild concentric left ventricular hypertrophy. Left ventricular diastolic parameters are indeterminate. Right Ventricle: The right ventricular size is normal. No increase in right ventricular wall thickness. Right ventricular systolic function is normal. Left Atrium: Left atrial size was normal in size. Right Atrium: Right atrial size was normal in size. Pericardium: Pericardial effusion measuring up to 0.9 cm. A moderately sized pericardial effusion is  present. There is diastolic collapse of the right ventricular free  Cardiology Consultation   Patient ID: Cydne Grahn MRN: [id number]; DOB: [birth year]  Admit date: 01/27/2023 Date of Consult: 02/08/2023  PCP:  [name], PA-C   [facility] Providers Cardiologist:  Nona Dell, MD        Patient Profile:   Zayley Arras is a 52 y.o. female with a hx of COPD, paroxysmal supraventricular tachycardia, hypertension, DM type II, Crohn's, or disorder who is being seen 02/08/2023 for the evaluation of tachycardia and small pericardial effusion at the request of Dr. Randol Kern.  History of Present Illness:   Ms. Remigio was originally admitted on 01/27/2023 after she was found at home unresponsive and brought to the emergency department.  In the days prior to being found unresponsive, patient had been noted with GI bleeding but declined to go to the hospital.  When she was found unresponsive by family, EMS was activated.  Per notes, EMS found patient surrounded by a pool of bloody emesis, only responsive, with systolic blood pressure in the 60s.  By the time patient arrived to the emergency department she had agonal breathing and no pulses detected.  She had 1 round of CPR and received 1 round of epinephrine with return of spontaneous circulation.  Patient subsequently intubated and patient admitted to the ICU/transferred to [facility]. There was also concern for septic shock of unknown source vs hypovolemic shock. Blood cultures 1/2 positive for Staphylococcus but there was suspicion that this was contaminant. GI workup including CT abdomen pelvis found stool burden as well as inflammation to the left side of the colon.  Per GI this raised concern of inflammatory versus infectious colitis.  C. difficile toxin negative but antigen positive.  By 9/30, patient successfully weaned from vasopressors.  On 10/2 patient underwent flex sig which found diffuse severe mucosal changes in the rectum and rectosigmoid colon and in distal sigmoid colon consistent  with Crohn's disease and likely consistent with infectious colitis.  As a result of these findings and history of c.dif, vancomycin 125 mg p.o. 4 times daily x 14 days initiated.  Patient extubated following this flex sig.  On 10/4, patient transitioned out of ICU.  Throughout this admission, patient with persistent tachycardia.  On 10/4 she was started on low-dose metoprolol by hospital medicine team.  On 10/6, due to persistent tachycardia, primary team checked a TSH, (found elevated at 4.878), and echocardiogram.  D-dimer was also checked and found elevated, but chest CTA negative for pulmonary embolism.  Echocardiogram with LVEF greater than 75%, hyperdynamic function.  Additionally small to moderate-sized pericardial effusion noted.  This finding of an effusion along with persistent tachycardia prompted cardiology consult today.  Today on exam, patient with limited memory of recent events. She does report mild dyspnea and awareness of fast HR on exam today. Denies chest pain.  Her primary complaint today is abdominal pain and distention.  Past Medical History:  Diagnosis Date   Anxiety    Asthma    Chronic low back pain    Collagen vascular disease (HCC)    COPD (chronic obstructive pulmonary disease) (HCC)    Crohn's disease (HCC)    GERD (gastroesophageal reflux disease)    EGD 01/2007 by Dr.Rourke small hiatal hernia s/p 56 french maloney    History of head injury    Hypertension    IBS (irritable bowel syndrome)    PSVT (paroxysmal supraventricular tachycardia) (HCC)    PTSD (post-traumatic stress disorder)    Recurrent chest pain    Seizure disorder (HCC)  Cardiology Consultation   Patient ID: Cydne Grahn MRN: [id number]; DOB: [birth year]  Admit date: 01/27/2023 Date of Consult: 02/08/2023  PCP:  [name], PA-C   [facility] Providers Cardiologist:  Nona Dell, MD        Patient Profile:   Zayley Arras is a 52 y.o. female with a hx of COPD, paroxysmal supraventricular tachycardia, hypertension, DM type II, Crohn's, or disorder who is being seen 02/08/2023 for the evaluation of tachycardia and small pericardial effusion at the request of Dr. Randol Kern.  History of Present Illness:   Ms. Remigio was originally admitted on 01/27/2023 after she was found at home unresponsive and brought to the emergency department.  In the days prior to being found unresponsive, patient had been noted with GI bleeding but declined to go to the hospital.  When she was found unresponsive by family, EMS was activated.  Per notes, EMS found patient surrounded by a pool of bloody emesis, only responsive, with systolic blood pressure in the 60s.  By the time patient arrived to the emergency department she had agonal breathing and no pulses detected.  She had 1 round of CPR and received 1 round of epinephrine with return of spontaneous circulation.  Patient subsequently intubated and patient admitted to the ICU/transferred to [facility]. There was also concern for septic shock of unknown source vs hypovolemic shock. Blood cultures 1/2 positive for Staphylococcus but there was suspicion that this was contaminant. GI workup including CT abdomen pelvis found stool burden as well as inflammation to the left side of the colon.  Per GI this raised concern of inflammatory versus infectious colitis.  C. difficile toxin negative but antigen positive.  By 9/30, patient successfully weaned from vasopressors.  On 10/2 patient underwent flex sig which found diffuse severe mucosal changes in the rectum and rectosigmoid colon and in distal sigmoid colon consistent  with Crohn's disease and likely consistent with infectious colitis.  As a result of these findings and history of c.dif, vancomycin 125 mg p.o. 4 times daily x 14 days initiated.  Patient extubated following this flex sig.  On 10/4, patient transitioned out of ICU.  Throughout this admission, patient with persistent tachycardia.  On 10/4 she was started on low-dose metoprolol by hospital medicine team.  On 10/6, due to persistent tachycardia, primary team checked a TSH, (found elevated at 4.878), and echocardiogram.  D-dimer was also checked and found elevated, but chest CTA negative for pulmonary embolism.  Echocardiogram with LVEF greater than 75%, hyperdynamic function.  Additionally small to moderate-sized pericardial effusion noted.  This finding of an effusion along with persistent tachycardia prompted cardiology consult today.  Today on exam, patient with limited memory of recent events. She does report mild dyspnea and awareness of fast HR on exam today. Denies chest pain.  Her primary complaint today is abdominal pain and distention.  Past Medical History:  Diagnosis Date   Anxiety    Asthma    Chronic low back pain    Collagen vascular disease (HCC)    COPD (chronic obstructive pulmonary disease) (HCC)    Crohn's disease (HCC)    GERD (gastroesophageal reflux disease)    EGD 01/2007 by Dr.Rourke small hiatal hernia s/p 56 french maloney    History of head injury    Hypertension    IBS (irritable bowel syndrome)    PSVT (paroxysmal supraventricular tachycardia) (HCC)    PTSD (post-traumatic stress disorder)    Recurrent chest pain    Seizure disorder (HCC)  Cardiology Consultation   Patient ID: Cydne Grahn MRN: [id number]; DOB: [birth year]  Admit date: 01/27/2023 Date of Consult: 02/08/2023  PCP:  [name], PA-C   [facility] Providers Cardiologist:  Nona Dell, MD        Patient Profile:   Zayley Arras is a 52 y.o. female with a hx of COPD, paroxysmal supraventricular tachycardia, hypertension, DM type II, Crohn's, or disorder who is being seen 02/08/2023 for the evaluation of tachycardia and small pericardial effusion at the request of Dr. Randol Kern.  History of Present Illness:   Ms. Remigio was originally admitted on 01/27/2023 after she was found at home unresponsive and brought to the emergency department.  In the days prior to being found unresponsive, patient had been noted with GI bleeding but declined to go to the hospital.  When she was found unresponsive by family, EMS was activated.  Per notes, EMS found patient surrounded by a pool of bloody emesis, only responsive, with systolic blood pressure in the 60s.  By the time patient arrived to the emergency department she had agonal breathing and no pulses detected.  She had 1 round of CPR and received 1 round of epinephrine with return of spontaneous circulation.  Patient subsequently intubated and patient admitted to the ICU/transferred to [facility]. There was also concern for septic shock of unknown source vs hypovolemic shock. Blood cultures 1/2 positive for Staphylococcus but there was suspicion that this was contaminant. GI workup including CT abdomen pelvis found stool burden as well as inflammation to the left side of the colon.  Per GI this raised concern of inflammatory versus infectious colitis.  C. difficile toxin negative but antigen positive.  By 9/30, patient successfully weaned from vasopressors.  On 10/2 patient underwent flex sig which found diffuse severe mucosal changes in the rectum and rectosigmoid colon and in distal sigmoid colon consistent  with Crohn's disease and likely consistent with infectious colitis.  As a result of these findings and history of c.dif, vancomycin 125 mg p.o. 4 times daily x 14 days initiated.  Patient extubated following this flex sig.  On 10/4, patient transitioned out of ICU.  Throughout this admission, patient with persistent tachycardia.  On 10/4 she was started on low-dose metoprolol by hospital medicine team.  On 10/6, due to persistent tachycardia, primary team checked a TSH, (found elevated at 4.878), and echocardiogram.  D-dimer was also checked and found elevated, but chest CTA negative for pulmonary embolism.  Echocardiogram with LVEF greater than 75%, hyperdynamic function.  Additionally small to moderate-sized pericardial effusion noted.  This finding of an effusion along with persistent tachycardia prompted cardiology consult today.  Today on exam, patient with limited memory of recent events. She does report mild dyspnea and awareness of fast HR on exam today. Denies chest pain.  Her primary complaint today is abdominal pain and distention.  Past Medical History:  Diagnosis Date   Anxiety    Asthma    Chronic low back pain    Collagen vascular disease (HCC)    COPD (chronic obstructive pulmonary disease) (HCC)    Crohn's disease (HCC)    GERD (gastroesophageal reflux disease)    EGD 01/2007 by Dr.Rourke small hiatal hernia s/p 56 french maloney    History of head injury    Hypertension    IBS (irritable bowel syndrome)    PSVT (paroxysmal supraventricular tachycardia) (HCC)    PTSD (post-traumatic stress disorder)    Recurrent chest pain    Seizure disorder (HCC)  Visualized thyroid is unremarkable. No pathologic thoracic adenopathy. Nasoenteric feeding tube extends into the upper abdomen beyond the margin examination. Esophagus unremarkable. Lungs/Pleura: Small right pleural effusion. Mild bibasilar atelectasis. Lungs are otherwise clear. No pneumothorax. No central obstructing lesion. Upper Abdomen: No acute abnormality. Musculoskeletal: No chest wall abnormality. No acute or significant osseous findings. Review of the MIP images confirms the above findings. IMPRESSION: 1. No pulmonary embolism. 2. Small right pleural effusion. 3. Small pericardial effusion. 4. Enlarged central pulmonary arteries in keeping with changes of pulmonary arterial hypertension. Electronically Signed   By: Helyn Numbers M.D.   On: 02/08/2023 00:27   ECHOCARDIOGRAM COMPLETE  Result Date: 02/07/2023    ECHOCARDIOGRAM REPORT   Patient Name:   [name] Date of Exam: 02/07/2023 Medical Rec #:  [id number]     Height:       63.0 in Accession #:    [id number]    Weight:       154.1 lb Date of Birth:  [birth year]     BSA:          1.731 m Patient Age:    52 years      BP:           102/62 mmHg Patient Gender: F             HR:           135 bpm. Exam Location:  Inpatient Procedure: 2D Echo, Color Doppler and Cardiac Doppler Indications:     Sinus Tachycardia  History:        Patient has prior history of Echocardiogram examinations, most                 recent 03/10/2022. Infectious Colitis and COPD,                 Arrythmias:Cardiac Arrest and Tachycardia; Risk Factors:Current                 Smoker.  Sonographer:    Milbert Coulter Referring Phys: 27 DAWOOD S ELGERGAWY IMPRESSIONS  1. Left ventricular ejection fraction, by estimation, is >75%. The left ventricle has hyperdynamic function. The left ventricle has no regional wall motion abnormalities. There is mild concentric left ventricular hypertrophy. Left ventricular diastolic parameters are indeterminate.  2. Right ventricular systolic function is normal. The right ventricular size is normal.  3. Pericardial effusion measuring up to 0.9 cm. Moderate pericardial effusion. There is no evidence of cardiac tamponade.  4. The mitral valve is normal in structure. No evidence of mitral valve regurgitation. No evidence of mitral stenosis.  5. The aortic valve is normal in structure. Aortic valve regurgitation is not visualized. No aortic stenosis is present.  6. The inferior vena cava is normal in size with greater than 50% respiratory variability, suggesting right atrial pressure of 3 mmHg. FINDINGS  Left Ventricle: Left ventricular ejection fraction, by estimation, is >75%. The left ventricle has hyperdynamic function. The left ventricle has no regional wall motion abnormalities. The left ventricular internal cavity size was normal in size. There is mild concentric left ventricular hypertrophy. Left ventricular diastolic parameters are indeterminate. Right Ventricle: The right ventricular size is normal. No increase in right ventricular wall thickness. Right ventricular systolic function is normal. Left Atrium: Left atrial size was normal in size. Right Atrium: Right atrial size was normal in size. Pericardium: Pericardial effusion measuring up to 0.9 cm. A moderately sized pericardial effusion is  present. There is diastolic collapse of the right ventricular free  Cardiology Consultation   Patient ID: Cydne Grahn MRN: [id number]; DOB: [birth year]  Admit date: 01/27/2023 Date of Consult: 02/08/2023  PCP:  [name], PA-C   [facility] Providers Cardiologist:  Nona Dell, MD        Patient Profile:   Zayley Arras is a 52 y.o. female with a hx of COPD, paroxysmal supraventricular tachycardia, hypertension, DM type II, Crohn's, or disorder who is being seen 02/08/2023 for the evaluation of tachycardia and small pericardial effusion at the request of Dr. Randol Kern.  History of Present Illness:   Ms. Remigio was originally admitted on 01/27/2023 after she was found at home unresponsive and brought to the emergency department.  In the days prior to being found unresponsive, patient had been noted with GI bleeding but declined to go to the hospital.  When she was found unresponsive by family, EMS was activated.  Per notes, EMS found patient surrounded by a pool of bloody emesis, only responsive, with systolic blood pressure in the 60s.  By the time patient arrived to the emergency department she had agonal breathing and no pulses detected.  She had 1 round of CPR and received 1 round of epinephrine with return of spontaneous circulation.  Patient subsequently intubated and patient admitted to the ICU/transferred to [facility]. There was also concern for septic shock of unknown source vs hypovolemic shock. Blood cultures 1/2 positive for Staphylococcus but there was suspicion that this was contaminant. GI workup including CT abdomen pelvis found stool burden as well as inflammation to the left side of the colon.  Per GI this raised concern of inflammatory versus infectious colitis.  C. difficile toxin negative but antigen positive.  By 9/30, patient successfully weaned from vasopressors.  On 10/2 patient underwent flex sig which found diffuse severe mucosal changes in the rectum and rectosigmoid colon and in distal sigmoid colon consistent  with Crohn's disease and likely consistent with infectious colitis.  As a result of these findings and history of c.dif, vancomycin 125 mg p.o. 4 times daily x 14 days initiated.  Patient extubated following this flex sig.  On 10/4, patient transitioned out of ICU.  Throughout this admission, patient with persistent tachycardia.  On 10/4 she was started on low-dose metoprolol by hospital medicine team.  On 10/6, due to persistent tachycardia, primary team checked a TSH, (found elevated at 4.878), and echocardiogram.  D-dimer was also checked and found elevated, but chest CTA negative for pulmonary embolism.  Echocardiogram with LVEF greater than 75%, hyperdynamic function.  Additionally small to moderate-sized pericardial effusion noted.  This finding of an effusion along with persistent tachycardia prompted cardiology consult today.  Today on exam, patient with limited memory of recent events. She does report mild dyspnea and awareness of fast HR on exam today. Denies chest pain.  Her primary complaint today is abdominal pain and distention.  Past Medical History:  Diagnosis Date   Anxiety    Asthma    Chronic low back pain    Collagen vascular disease (HCC)    COPD (chronic obstructive pulmonary disease) (HCC)    Crohn's disease (HCC)    GERD (gastroesophageal reflux disease)    EGD 01/2007 by Dr.Rourke small hiatal hernia s/p 56 french maloney    History of head injury    Hypertension    IBS (irritable bowel syndrome)    PSVT (paroxysmal supraventricular tachycardia) (HCC)    PTSD (post-traumatic stress disorder)    Recurrent chest pain    Seizure disorder (HCC)  Cardiology Consultation   Patient ID: Cydne Grahn MRN: [id number]; DOB: [birth year]  Admit date: 01/27/2023 Date of Consult: 02/08/2023  PCP:  [name], PA-C   [facility] Providers Cardiologist:  Nona Dell, MD        Patient Profile:   Zayley Arras is a 52 y.o. female with a hx of COPD, paroxysmal supraventricular tachycardia, hypertension, DM type II, Crohn's, or disorder who is being seen 02/08/2023 for the evaluation of tachycardia and small pericardial effusion at the request of Dr. Randol Kern.  History of Present Illness:   Ms. Remigio was originally admitted on 01/27/2023 after she was found at home unresponsive and brought to the emergency department.  In the days prior to being found unresponsive, patient had been noted with GI bleeding but declined to go to the hospital.  When she was found unresponsive by family, EMS was activated.  Per notes, EMS found patient surrounded by a pool of bloody emesis, only responsive, with systolic blood pressure in the 60s.  By the time patient arrived to the emergency department she had agonal breathing and no pulses detected.  She had 1 round of CPR and received 1 round of epinephrine with return of spontaneous circulation.  Patient subsequently intubated and patient admitted to the ICU/transferred to [facility]. There was also concern for septic shock of unknown source vs hypovolemic shock. Blood cultures 1/2 positive for Staphylococcus but there was suspicion that this was contaminant. GI workup including CT abdomen pelvis found stool burden as well as inflammation to the left side of the colon.  Per GI this raised concern of inflammatory versus infectious colitis.  C. difficile toxin negative but antigen positive.  By 9/30, patient successfully weaned from vasopressors.  On 10/2 patient underwent flex sig which found diffuse severe mucosal changes in the rectum and rectosigmoid colon and in distal sigmoid colon consistent  with Crohn's disease and likely consistent with infectious colitis.  As a result of these findings and history of c.dif, vancomycin 125 mg p.o. 4 times daily x 14 days initiated.  Patient extubated following this flex sig.  On 10/4, patient transitioned out of ICU.  Throughout this admission, patient with persistent tachycardia.  On 10/4 she was started on low-dose metoprolol by hospital medicine team.  On 10/6, due to persistent tachycardia, primary team checked a TSH, (found elevated at 4.878), and echocardiogram.  D-dimer was also checked and found elevated, but chest CTA negative for pulmonary embolism.  Echocardiogram with LVEF greater than 75%, hyperdynamic function.  Additionally small to moderate-sized pericardial effusion noted.  This finding of an effusion along with persistent tachycardia prompted cardiology consult today.  Today on exam, patient with limited memory of recent events. She does report mild dyspnea and awareness of fast HR on exam today. Denies chest pain.  Her primary complaint today is abdominal pain and distention.  Past Medical History:  Diagnosis Date   Anxiety    Asthma    Chronic low back pain    Collagen vascular disease (HCC)    COPD (chronic obstructive pulmonary disease) (HCC)    Crohn's disease (HCC)    GERD (gastroesophageal reflux disease)    EGD 01/2007 by Dr.Rourke small hiatal hernia s/p 56 french maloney    History of head injury    Hypertension    IBS (irritable bowel syndrome)    PSVT (paroxysmal supraventricular tachycardia) (HCC)    PTSD (post-traumatic stress disorder)    Recurrent chest pain    Seizure disorder (HCC)  wall. There is no evidence of cardiac tamponade. Mitral Valve: The mitral valve is normal in structure. No evidence of mitral valve regurgitation. No evidence of mitral valve stenosis. Tricuspid Valve: The tricuspid valve is normal in structure. Tricuspid valve regurgitation is not demonstrated. No evidence of tricuspid stenosis. Aortic Valve: The aortic valve is normal in structure. Aortic valve regurgitation is not visualized. No aortic stenosis is present. Pulmonic Valve: The pulmonic valve was normal in structure. Pulmonic valve regurgitation is not visualized. No evidence of pulmonic stenosis. Aorta: The aortic root is normal in size and structure. Venous: The inferior vena cava is normal in size with greater than 50% respiratory variability, suggesting right atrial pressure of 3 mmHg. IAS/Shunts: No atrial level shunt detected by color flow Doppler.  LEFT VENTRICLE PLAX 2D LVIDd:         4.20 cm LVIDs:         1.80 cm LV PW:         1.20 cm LV IVS:        1.20 cm LVOT diam:     1.90 cm LV SV:         78 LV SV Index:   45 LVOT Area:     2.84 cm  RIGHT VENTRICLE RV S prime:     17.70 cm/s TAPSE (M-mode): 2.5 cm LEFT ATRIUM             Index        RIGHT ATRIUM           Index LA Vol (A2C):   22.3 ml 12.88 ml/m  RA Area:     15.20 cm LA Vol (A4C):   32.8 ml 18.95 ml/m  RA Volume:   32.00 ml  18.49 ml/m LA Biplane Vol: 29.3 ml 16.93 ml/m  AORTIC VALVE LVOT Vmax:   143.00 cm/s LVOT Vmean:  100.000 cm/s LVOT VTI:    0.276 m  AORTA Ao Root diam: 3.20 cm Ao Asc diam:  3.20 cm  SHUNTS Systemic VTI:  0.28 m Systemic Diam: 1.90 cm Chilton Si MD Electronically signed by Chilton Si MD Signature Date/Time: 02/07/2023/5:23:03 PM    Final    VAS [place] LOWER EXTREMITY VENOUS (DVT)  Result Date: 02/07/2023  Lower Venous DVT Study Patient Name:  ASHLIEGH PAREKH  Date of Exam:   02/07/2023 Medical Rec #: [id number]      Accession #:    [id number]  Date of Birth: [birth year]      Patient Gender: F Patient Age:   34 years Exam Location:  [facility] Procedure:      VAS [place] LOWER EXTREMITY VENOUS (DVT) Referring Phys: DAWOOD ELGERGAWY --------------------------------------------------------------------------------  Indications: Swelling.  Risk Factors: None identified. Comparison Study: No prior studies. Performing Technologist: Chanda Busing RVT  Examination Guidelines: A complete evaluation includes B-mode imaging, spectral Doppler, color Doppler, and power Doppler as needed of all accessible portions of each vessel. Bilateral testing is considered an integral part of a complete examination. Limited examinations for reoccurring indications may be performed as noted. The reflux portion of the exam is performed with the patient in reverse Trendelenburg.  +---------+---------------+---------+-----------+----------+--------------+ RIGHT    CompressibilityPhasicitySpontaneityPropertiesThrombus Aging +---------+---------------+---------+-----------+----------+--------------+ CFV      Full           Yes      Yes                                 +---------+---------------+---------+-----------+----------+--------------+  Cardiology Consultation   Patient ID: Cydne Grahn MRN: [id number]; DOB: [birth year]  Admit date: 01/27/2023 Date of Consult: 02/08/2023  PCP:  [name], PA-C   [facility] Providers Cardiologist:  Nona Dell, MD        Patient Profile:   Zayley Arras is a 52 y.o. female with a hx of COPD, paroxysmal supraventricular tachycardia, hypertension, DM type II, Crohn's, or disorder who is being seen 02/08/2023 for the evaluation of tachycardia and small pericardial effusion at the request of Dr. Randol Kern.  History of Present Illness:   Ms. Remigio was originally admitted on 01/27/2023 after she was found at home unresponsive and brought to the emergency department.  In the days prior to being found unresponsive, patient had been noted with GI bleeding but declined to go to the hospital.  When she was found unresponsive by family, EMS was activated.  Per notes, EMS found patient surrounded by a pool of bloody emesis, only responsive, with systolic blood pressure in the 60s.  By the time patient arrived to the emergency department she had agonal breathing and no pulses detected.  She had 1 round of CPR and received 1 round of epinephrine with return of spontaneous circulation.  Patient subsequently intubated and patient admitted to the ICU/transferred to [facility]. There was also concern for septic shock of unknown source vs hypovolemic shock. Blood cultures 1/2 positive for Staphylococcus but there was suspicion that this was contaminant. GI workup including CT abdomen pelvis found stool burden as well as inflammation to the left side of the colon.  Per GI this raised concern of inflammatory versus infectious colitis.  C. difficile toxin negative but antigen positive.  By 9/30, patient successfully weaned from vasopressors.  On 10/2 patient underwent flex sig which found diffuse severe mucosal changes in the rectum and rectosigmoid colon and in distal sigmoid colon consistent  with Crohn's disease and likely consistent with infectious colitis.  As a result of these findings and history of c.dif, vancomycin 125 mg p.o. 4 times daily x 14 days initiated.  Patient extubated following this flex sig.  On 10/4, patient transitioned out of ICU.  Throughout this admission, patient with persistent tachycardia.  On 10/4 she was started on low-dose metoprolol by hospital medicine team.  On 10/6, due to persistent tachycardia, primary team checked a TSH, (found elevated at 4.878), and echocardiogram.  D-dimer was also checked and found elevated, but chest CTA negative for pulmonary embolism.  Echocardiogram with LVEF greater than 75%, hyperdynamic function.  Additionally small to moderate-sized pericardial effusion noted.  This finding of an effusion along with persistent tachycardia prompted cardiology consult today.  Today on exam, patient with limited memory of recent events. She does report mild dyspnea and awareness of fast HR on exam today. Denies chest pain.  Her primary complaint today is abdominal pain and distention.  Past Medical History:  Diagnosis Date   Anxiety    Asthma    Chronic low back pain    Collagen vascular disease (HCC)    COPD (chronic obstructive pulmonary disease) (HCC)    Crohn's disease (HCC)    GERD (gastroesophageal reflux disease)    EGD 01/2007 by Dr.Rourke small hiatal hernia s/p 56 french maloney    History of head injury    Hypertension    IBS (irritable bowel syndrome)    PSVT (paroxysmal supraventricular tachycardia) (HCC)    PTSD (post-traumatic stress disorder)    Recurrent chest pain    Seizure disorder (HCC)  Cardiology Consultation   Patient ID: Cydne Grahn MRN: [id number]; DOB: [birth year]  Admit date: 01/27/2023 Date of Consult: 02/08/2023  PCP:  [name], PA-C   [facility] Providers Cardiologist:  Nona Dell, MD        Patient Profile:   Zayley Arras is a 52 y.o. female with a hx of COPD, paroxysmal supraventricular tachycardia, hypertension, DM type II, Crohn's, or disorder who is being seen 02/08/2023 for the evaluation of tachycardia and small pericardial effusion at the request of Dr. Randol Kern.  History of Present Illness:   Ms. Remigio was originally admitted on 01/27/2023 after she was found at home unresponsive and brought to the emergency department.  In the days prior to being found unresponsive, patient had been noted with GI bleeding but declined to go to the hospital.  When she was found unresponsive by family, EMS was activated.  Per notes, EMS found patient surrounded by a pool of bloody emesis, only responsive, with systolic blood pressure in the 60s.  By the time patient arrived to the emergency department she had agonal breathing and no pulses detected.  She had 1 round of CPR and received 1 round of epinephrine with return of spontaneous circulation.  Patient subsequently intubated and patient admitted to the ICU/transferred to [facility]. There was also concern for septic shock of unknown source vs hypovolemic shock. Blood cultures 1/2 positive for Staphylococcus but there was suspicion that this was contaminant. GI workup including CT abdomen pelvis found stool burden as well as inflammation to the left side of the colon.  Per GI this raised concern of inflammatory versus infectious colitis.  C. difficile toxin negative but antigen positive.  By 9/30, patient successfully weaned from vasopressors.  On 10/2 patient underwent flex sig which found diffuse severe mucosal changes in the rectum and rectosigmoid colon and in distal sigmoid colon consistent  with Crohn's disease and likely consistent with infectious colitis.  As a result of these findings and history of c.dif, vancomycin 125 mg p.o. 4 times daily x 14 days initiated.  Patient extubated following this flex sig.  On 10/4, patient transitioned out of ICU.  Throughout this admission, patient with persistent tachycardia.  On 10/4 she was started on low-dose metoprolol by hospital medicine team.  On 10/6, due to persistent tachycardia, primary team checked a TSH, (found elevated at 4.878), and echocardiogram.  D-dimer was also checked and found elevated, but chest CTA negative for pulmonary embolism.  Echocardiogram with LVEF greater than 75%, hyperdynamic function.  Additionally small to moderate-sized pericardial effusion noted.  This finding of an effusion along with persistent tachycardia prompted cardiology consult today.  Today on exam, patient with limited memory of recent events. She does report mild dyspnea and awareness of fast HR on exam today. Denies chest pain.  Her primary complaint today is abdominal pain and distention.  Past Medical History:  Diagnosis Date   Anxiety    Asthma    Chronic low back pain    Collagen vascular disease (HCC)    COPD (chronic obstructive pulmonary disease) (HCC)    Crohn's disease (HCC)    GERD (gastroesophageal reflux disease)    EGD 01/2007 by Dr.Rourke small hiatal hernia s/p 56 french maloney    History of head injury    Hypertension    IBS (irritable bowel syndrome)    PSVT (paroxysmal supraventricular tachycardia) (HCC)    PTSD (post-traumatic stress disorder)    Recurrent chest pain    Seizure disorder (HCC)

## 2023-02-08 NOTE — Progress Notes (Addendum)
Patient ID: Nonie Hoyer, female   DOB: May 27, 1970, 52 y.o.   MRN: [id number]    Progress Note   Subjective   Day # 13 CC: septic shock, hx of Crohns disease with recent initiation of Skyrizi  Suspected C. difficile colitis Flex sigmoid 02/02/2023  Oral vancomycin day #6 Dose #2 Cristy Folks 01/14/2023 Tube feeding 60 cc/h Flexi-Seal in place, 400- 500 cc of liquid stool  2D echo yesterday EF greater than 75% pericardial effusion measuring up to 0.9 cm moderate Lower extremity Doppler bilateral yesterday-no evidence of DVT bilateral  Patient unfortunately feeling worse today, holding her abdomen, no appetite nausea but no vomiting says her abdomen is hurting all across the lower and mid abdomen and feels tight.   Objective   Vital signs in last 24 hours: Temp:  [98 F (36.7 C)-99.2 F (37.3 C)] 98 F (36.7 C) (10/08 0818) Pulse Rate:  [107-137] 107 (10/08 0818) Resp:  [21-23] 21 (10/08 0818) BP: (108-134)/(48-57) 119/49 (10/08 0818) SpO2:  [98 %] 98 % (10/08 0818) Last BM Date : 02/07/23 General: Older white female in NAD, uncomfortable appearing Heart:  Regular rate and rhythm; no murmurs Lungs: Respirations even and unlabored, lungs CTA bilaterally Abdomen: More protuberant, diffusely tender across the lower abdomen, bowel sounds quiet occasional tinkling Extremities:  Without edema. Neurologic:  Alert and oriented,  grossly normal neurologically. Psych:  Cooperative. Normal mood and affect.  Intake/Output from previous day: 10/07 0701 - 10/08 0700 In: -  Out: 1000 [Stool:1000] Intake/Output this shift: No intake/output data recorded.  Lab Results: Recent Labs    02/07/23 0746 02/08/23 0352 02/08/23 0727  WBC 14.2* 13.1* 12.6*  HGB 9.3* 10.4* 11.1*  HCT 29.6* 32.5* 34.6*  PLT 823* 1,031* 806*   BMET Recent Labs    02/05/23 1720 02/07/23 0746 02/08/23 0352  NA 134* 132* 135  K 4.1 4.0 4.3  CL 104 101 103  CO2 22 24 21*  GLUCOSE 229* 136* 156*  BUN 12 6 6    CREATININE 0.57 0.47 0.62  CALCIUM 7.8* 8.4* 8.6*   LFT No results for input(s): "PROT", "ALBUMIN", "AST", "ALT", "ALKPHOS", "BILITOT", "BILIDIR", "IBILI" in the last 72 hours. PT/INR No results for input(s): "LABPROT", "INR" in the last 72 hours.  Studies/Results: CT Angio Chest Pulmonary Embolism (PE) W or WO Contrast  Result Date: 02/08/2023 CLINICAL DATA:  High probability pulmonary embolism, sinus tachycardia, elevated D-dimer EXAM: CT ANGIOGRAPHY CHEST WITH CONTRAST TECHNIQUE: Multidetector CT imaging of the chest was performed using the standard protocol during bolus administration of intravenous contrast. Multiplanar CT image reconstructions and MIPs were obtained to evaluate the vascular anatomy. RADIATION DOSE REDUCTION: This exam was performed according to the departmental dose-optimization program which includes automated exposure control, adjustment of the mA and/or kV according to patient size and/or use of iterative reconstruction technique. CONTRAST:  75mL OMNIPAQUE IOHEXOL 350 MG/ML SOLN COMPARISON:  None Available. FINDINGS: Cardiovascular: No significant coronary artery calcification. Global cardiac size is within normal limits. Small pericardial effusion. Central pulmonary arteries are enlarged in keeping with changes of pulmonary arterial hypertension. No intraluminal filling defect identified through the segmental level to suggest acute pulmonary embolism. The thoracic aorta is unremarkable. Mediastinum/Nodes: Visualized thyroid is unremarkable. No pathologic thoracic adenopathy. Nasoenteric feeding tube extends into the upper abdomen beyond the margin examination. Esophagus unremarkable. Lungs/Pleura: Small right pleural effusion. Mild bibasilar atelectasis. Lungs are otherwise clear. No pneumothorax. No central obstructing lesion. Upper Abdomen: No acute abnormality. Musculoskeletal: No chest wall abnormality. No acute or significant osseous  findings. Review of the MIP images  confirms the above findings. IMPRESSION: 1. No pulmonary embolism. 2. Small right pleural effusion. 3. Small pericardial effusion. 4. Enlarged central pulmonary arteries in keeping with changes of pulmonary arterial hypertension. Electronically Signed   By: Helyn Numbers M.D.   On: 02/08/2023 00:27   ECHOCARDIOGRAM COMPLETE  Result Date: 02/07/2023    ECHOCARDIOGRAM REPORT   Patient Name:   KAI RAILSBACK Date of Exam: 02/07/2023 Medical Rec #:  [id number]     Height:       63.0 in Accession #:    [id number]    Weight:       154.1 lb Date of Birth:  1971-04-28     BSA:          1.731 m Patient Age:    51 years      BP:           102/62 mmHg Patient Gender: F             HR:           135 bpm. Exam Location:  Inpatient Procedure: 2D Echo, Color Doppler and Cardiac Doppler Indications:    Sinus Tachycardia  History:        Patient has prior history of Echocardiogram examinations, most                 recent 03/10/2022. Infectious Colitis and COPD,                 Arrythmias:Cardiac Arrest and Tachycardia; Risk Factors:Current                 Smoker.  Sonographer:    Milbert Coulter Referring Phys: [id number] DAWOOD S ELGERGAWY IMPRESSIONS  1. Left ventricular ejection fraction, by estimation, is >75%. The left ventricle has hyperdynamic function. The left ventricle has no regional wall motion abnormalities. There is mild concentric left ventricular hypertrophy. Left ventricular diastolic parameters are indeterminate.  2. Right ventricular systolic function is normal. The right ventricular size is normal.  3. Pericardial effusion measuring up to 0.9 cm. Moderate pericardial effusion. There is no evidence of cardiac tamponade.  4. The mitral valve is normal in structure. No evidence of mitral valve regurgitation. No evidence of mitral stenosis.  5. The aortic valve is normal in structure. Aortic valve regurgitation is not visualized. No aortic stenosis is present.  6. The inferior vena cava is normal in size with greater than  50% respiratory variability, suggesting right atrial pressure of 3 mmHg. FINDINGS  Left Ventricle: Left ventricular ejection fraction, by estimation, is >75%. The left ventricle has hyperdynamic function. The left ventricle has no regional wall motion abnormalities. The left ventricular internal cavity size was normal in size. There is mild concentric left ventricular hypertrophy. Left ventricular diastolic parameters are indeterminate. Right Ventricle: The right ventricular size is normal. No increase in right ventricular wall thickness. Right ventricular systolic function is normal. Left Atrium: Left atrial size was normal in size. Right Atrium: Right atrial size was normal in size. Pericardium: Pericardial effusion measuring up to 0.9 cm. A moderately sized pericardial effusion is present. There is diastolic collapse of the right ventricular free wall. There is no evidence of cardiac tamponade. Mitral Valve: The mitral valve is normal in structure. No evidence of mitral valve regurgitation. No evidence of mitral valve stenosis. Tricuspid Valve: The tricuspid valve is normal in structure. Tricuspid valve regurgitation is not demonstrated. No evidence of tricuspid stenosis. Aortic Valve: The aortic valve is  normal in structure. Aortic valve regurgitation is not visualized. No aortic stenosis is present. Pulmonic Valve: The pulmonic valve was normal in structure. Pulmonic valve regurgitation is not visualized. No evidence of pulmonic stenosis. Aorta: The aortic root is normal in size and structure. Venous: The inferior vena cava is normal in size with greater than 50% respiratory variability, suggesting right atrial pressure of 3 mmHg. IAS/Shunts: No atrial level shunt detected by color flow Doppler.  LEFT VENTRICLE PLAX 2D LVIDd:         4.20 cm LVIDs:         1.80 cm LV PW:         1.20 cm LV IVS:        1.20 cm LVOT diam:     1.90 cm LV SV:         78 LV SV Index:   45 LVOT Area:     2.84 cm  RIGHT VENTRICLE RV  S prime:     17.70 cm/s TAPSE (M-mode): 2.5 cm LEFT ATRIUM             Index        RIGHT ATRIUM           Index LA Vol (A2C):   22.3 ml 12.88 ml/m  RA Area:     15.20 cm LA Vol (A4C):   32.8 ml 18.95 ml/m  RA Volume:   32.00 ml  18.49 ml/m LA Biplane Vol: 29.3 ml 16.93 ml/m  AORTIC VALVE LVOT Vmax:   143.00 cm/s LVOT Vmean:  100.000 cm/s LVOT VTI:    0.276 m  AORTA Ao Root diam: 3.20 cm Ao Asc diam:  3.20 cm  SHUNTS Systemic VTI:  0.28 m Systemic Diam: 1.90 cm Chilton Si MD Electronically signed by Chilton Si MD Signature Date/Time: 02/07/2023/5:23:03 PM    Final    VAS [place] LOWER EXTREMITY VENOUS (DVT)  Result Date: 02/07/2023  Lower Venous DVT Study Patient Name:  ASHLY YEPEZ  Date of Exam:   02/07/2023 Medical Rec #: [id number]      Accession #:    [id number] Date of Birth: 1970-07-02      Patient Gender: F Patient Age:   52 years Exam Location:  [facility] Procedure:      VAS [place] LOWER EXTREMITY VENOUS (DVT) Referring Phys: DAWOOD ELGERGAWY --------------------------------------------------------------------------------  Indications: Swelling.  Risk Factors: None identified. Comparison Study: No prior studies. Performing Technologist: Chanda Busing RVT  Examination Guidelines: A complete evaluation includes B-mode imaging, spectral Doppler, color Doppler, and power Doppler as needed of all accessible portions of each vessel. Bilateral testing is considered an integral part of a complete examination. Limited examinations for reoccurring indications may be performed as noted. The reflux portion of the exam is performed with the patient in reverse Trendelenburg.  +---------+---------------+---------+-----------+----------+--------------+ RIGHT    CompressibilityPhasicitySpontaneityPropertiesThrombus Aging +---------+---------------+---------+-----------+----------+--------------+ CFV      Full           Yes      Yes                                  +---------+---------------+---------+-----------+----------+--------------+ SFJ      Full                                                        +---------+---------------+---------+-----------+----------+--------------+  findings. Review of the MIP images  confirms the above findings. IMPRESSION: 1. No pulmonary embolism. 2. Small right pleural effusion. 3. Small pericardial effusion. 4. Enlarged central pulmonary arteries in keeping with changes of pulmonary arterial hypertension. Electronically Signed   By: Helyn Numbers M.D.   On: 02/08/2023 00:27   ECHOCARDIOGRAM COMPLETE  Result Date: 02/07/2023    ECHOCARDIOGRAM REPORT   Patient Name:   KAI RAILSBACK Date of Exam: 02/07/2023 Medical Rec #:  [id number]     Height:       63.0 in Accession #:    [id number]    Weight:       154.1 lb Date of Birth:  1971-04-28     BSA:          1.731 m Patient Age:    51 years      BP:           102/62 mmHg Patient Gender: F             HR:           135 bpm. Exam Location:  Inpatient Procedure: 2D Echo, Color Doppler and Cardiac Doppler Indications:    Sinus Tachycardia  History:        Patient has prior history of Echocardiogram examinations, most                 recent 03/10/2022. Infectious Colitis and COPD,                 Arrythmias:Cardiac Arrest and Tachycardia; Risk Factors:Current                 Smoker.  Sonographer:    Milbert Coulter Referring Phys: [id number] DAWOOD S ELGERGAWY IMPRESSIONS  1. Left ventricular ejection fraction, by estimation, is >75%. The left ventricle has hyperdynamic function. The left ventricle has no regional wall motion abnormalities. There is mild concentric left ventricular hypertrophy. Left ventricular diastolic parameters are indeterminate.  2. Right ventricular systolic function is normal. The right ventricular size is normal.  3. Pericardial effusion measuring up to 0.9 cm. Moderate pericardial effusion. There is no evidence of cardiac tamponade.  4. The mitral valve is normal in structure. No evidence of mitral valve regurgitation. No evidence of mitral stenosis.  5. The aortic valve is normal in structure. Aortic valve regurgitation is not visualized. No aortic stenosis is present.  6. The inferior vena cava is normal in size with greater than  50% respiratory variability, suggesting right atrial pressure of 3 mmHg. FINDINGS  Left Ventricle: Left ventricular ejection fraction, by estimation, is >75%. The left ventricle has hyperdynamic function. The left ventricle has no regional wall motion abnormalities. The left ventricular internal cavity size was normal in size. There is mild concentric left ventricular hypertrophy. Left ventricular diastolic parameters are indeterminate. Right Ventricle: The right ventricular size is normal. No increase in right ventricular wall thickness. Right ventricular systolic function is normal. Left Atrium: Left atrial size was normal in size. Right Atrium: Right atrial size was normal in size. Pericardium: Pericardial effusion measuring up to 0.9 cm. A moderately sized pericardial effusion is present. There is diastolic collapse of the right ventricular free wall. There is no evidence of cardiac tamponade. Mitral Valve: The mitral valve is normal in structure. No evidence of mitral valve regurgitation. No evidence of mitral valve stenosis. Tricuspid Valve: The tricuspid valve is normal in structure. Tricuspid valve regurgitation is not demonstrated. No evidence of tricuspid stenosis. Aortic Valve: The aortic valve is  findings. Review of the MIP images  confirms the above findings. IMPRESSION: 1. No pulmonary embolism. 2. Small right pleural effusion. 3. Small pericardial effusion. 4. Enlarged central pulmonary arteries in keeping with changes of pulmonary arterial hypertension. Electronically Signed   By: Helyn Numbers M.D.   On: 02/08/2023 00:27   ECHOCARDIOGRAM COMPLETE  Result Date: 02/07/2023    ECHOCARDIOGRAM REPORT   Patient Name:   KAI RAILSBACK Date of Exam: 02/07/2023 Medical Rec #:  [id number]     Height:       63.0 in Accession #:    [id number]    Weight:       154.1 lb Date of Birth:  1971-04-28     BSA:          1.731 m Patient Age:    51 years      BP:           102/62 mmHg Patient Gender: F             HR:           135 bpm. Exam Location:  Inpatient Procedure: 2D Echo, Color Doppler and Cardiac Doppler Indications:    Sinus Tachycardia  History:        Patient has prior history of Echocardiogram examinations, most                 recent 03/10/2022. Infectious Colitis and COPD,                 Arrythmias:Cardiac Arrest and Tachycardia; Risk Factors:Current                 Smoker.  Sonographer:    Milbert Coulter Referring Phys: 69 DAWOOD S ELGERGAWY IMPRESSIONS  1. Left ventricular ejection fraction, by estimation, is >75%. The left ventricle has hyperdynamic function. The left ventricle has no regional wall motion abnormalities. There is mild concentric left ventricular hypertrophy. Left ventricular diastolic parameters are indeterminate.  2. Right ventricular systolic function is normal. The right ventricular size is normal.  3. Pericardial effusion measuring up to 0.9 cm. Moderate pericardial effusion. There is no evidence of cardiac tamponade.  4. The mitral valve is normal in structure. No evidence of mitral valve regurgitation. No evidence of mitral stenosis.  5. The aortic valve is normal in structure. Aortic valve regurgitation is not visualized. No aortic stenosis is present.  6. The inferior vena cava is normal in size with greater than  50% respiratory variability, suggesting right atrial pressure of 3 mmHg. FINDINGS  Left Ventricle: Left ventricular ejection fraction, by estimation, is >75%. The left ventricle has hyperdynamic function. The left ventricle has no regional wall motion abnormalities. The left ventricular internal cavity size was normal in size. There is mild concentric left ventricular hypertrophy. Left ventricular diastolic parameters are indeterminate. Right Ventricle: The right ventricular size is normal. No increase in right ventricular wall thickness. Right ventricular systolic function is normal. Left Atrium: Left atrial size was normal in size. Right Atrium: Right atrial size was normal in size. Pericardium: Pericardial effusion measuring up to 0.9 cm. A moderately sized pericardial effusion is present. There is diastolic collapse of the right ventricular free wall. There is no evidence of cardiac tamponade. Mitral Valve: The mitral valve is normal in structure. No evidence of mitral valve regurgitation. No evidence of mitral valve stenosis. Tricuspid Valve: The tricuspid valve is normal in structure. Tricuspid valve regurgitation is not demonstrated. No evidence of tricuspid stenosis. Aortic Valve: The aortic valve is  Patient ID: Nonie Hoyer, female   DOB: May 27, 1970, 52 y.o.   MRN: [id number]    Progress Note   Subjective   Day # 13 CC: septic shock, hx of Crohns disease with recent initiation of Skyrizi  Suspected C. difficile colitis Flex sigmoid 02/02/2023  Oral vancomycin day #6 Dose #2 Cristy Folks 01/14/2023 Tube feeding 60 cc/h Flexi-Seal in place, 400- 500 cc of liquid stool  2D echo yesterday EF greater than 75% pericardial effusion measuring up to 0.9 cm moderate Lower extremity Doppler bilateral yesterday-no evidence of DVT bilateral  Patient unfortunately feeling worse today, holding her abdomen, no appetite nausea but no vomiting says her abdomen is hurting all across the lower and mid abdomen and feels tight.   Objective   Vital signs in last 24 hours: Temp:  [98 F (36.7 C)-99.2 F (37.3 C)] 98 F (36.7 C) (10/08 0818) Pulse Rate:  [107-137] 107 (10/08 0818) Resp:  [21-23] 21 (10/08 0818) BP: (108-134)/(48-57) 119/49 (10/08 0818) SpO2:  [98 %] 98 % (10/08 0818) Last BM Date : 02/07/23 General: Older white female in NAD, uncomfortable appearing Heart:  Regular rate and rhythm; no murmurs Lungs: Respirations even and unlabored, lungs CTA bilaterally Abdomen: More protuberant, diffusely tender across the lower abdomen, bowel sounds quiet occasional tinkling Extremities:  Without edema. Neurologic:  Alert and oriented,  grossly normal neurologically. Psych:  Cooperative. Normal mood and affect.  Intake/Output from previous day: 10/07 0701 - 10/08 0700 In: -  Out: 1000 [Stool:1000] Intake/Output this shift: No intake/output data recorded.  Lab Results: Recent Labs    02/07/23 0746 02/08/23 0352 02/08/23 0727  WBC 14.2* 13.1* 12.6*  HGB 9.3* 10.4* 11.1*  HCT 29.6* 32.5* 34.6*  PLT 823* 1,031* 806*   BMET Recent Labs    02/05/23 1720 02/07/23 0746 02/08/23 0352  NA 134* 132* 135  K 4.1 4.0 4.3  CL 104 101 103  CO2 22 24 21*  GLUCOSE 229* 136* 156*  BUN 12 6 6    CREATININE 0.57 0.47 0.62  CALCIUM 7.8* 8.4* 8.6*   LFT No results for input(s): "PROT", "ALBUMIN", "AST", "ALT", "ALKPHOS", "BILITOT", "BILIDIR", "IBILI" in the last 72 hours. PT/INR No results for input(s): "LABPROT", "INR" in the last 72 hours.  Studies/Results: CT Angio Chest Pulmonary Embolism (PE) W or WO Contrast  Result Date: 02/08/2023 CLINICAL DATA:  High probability pulmonary embolism, sinus tachycardia, elevated D-dimer EXAM: CT ANGIOGRAPHY CHEST WITH CONTRAST TECHNIQUE: Multidetector CT imaging of the chest was performed using the standard protocol during bolus administration of intravenous contrast. Multiplanar CT image reconstructions and MIPs were obtained to evaluate the vascular anatomy. RADIATION DOSE REDUCTION: This exam was performed according to the departmental dose-optimization program which includes automated exposure control, adjustment of the mA and/or kV according to patient size and/or use of iterative reconstruction technique. CONTRAST:  75mL OMNIPAQUE IOHEXOL 350 MG/ML SOLN COMPARISON:  None Available. FINDINGS: Cardiovascular: No significant coronary artery calcification. Global cardiac size is within normal limits. Small pericardial effusion. Central pulmonary arteries are enlarged in keeping with changes of pulmonary arterial hypertension. No intraluminal filling defect identified through the segmental level to suggest acute pulmonary embolism. The thoracic aorta is unremarkable. Mediastinum/Nodes: Visualized thyroid is unremarkable. No pathologic thoracic adenopathy. Nasoenteric feeding tube extends into the upper abdomen beyond the margin examination. Esophagus unremarkable. Lungs/Pleura: Small right pleural effusion. Mild bibasilar atelectasis. Lungs are otherwise clear. No pneumothorax. No central obstructing lesion. Upper Abdomen: No acute abnormality. Musculoskeletal: No chest wall abnormality. No acute or significant osseous

## 2023-02-08 NOTE — Progress Notes (Signed)
valve regurgitation is not visualized. No aortic stenosis is present.  6. The inferior vena cava is normal in size with greater than 50% respiratory variability, suggesting right atrial pressure of 3 mmHg. FINDINGS  Left Ventricle: Left  ventricular ejection fraction, by estimation, is >75%. The left ventricle has hyperdynamic function. The left ventricle has no regional wall motion abnormalities. The left ventricular internal cavity size was normal in size. There is mild concentric left ventricular hypertrophy. Left ventricular diastolic parameters are indeterminate. Right Ventricle: The right ventricular size is normal. No increase in right ventricular wall thickness. Right ventricular systolic function is normal. Left Atrium: Left atrial size was normal in size. Right Atrium: Right atrial size was normal in size. Pericardium: Pericardial effusion measuring up to 0.9 cm. A moderately sized pericardial effusion is present. There is diastolic collapse of the right ventricular free wall. There is no evidence of cardiac tamponade. Mitral Valve: The mitral valve is normal in structure. No evidence of mitral valve regurgitation. No evidence of mitral valve stenosis. Tricuspid Valve: The tricuspid valve is normal in structure. Tricuspid valve regurgitation is not demonstrated. No evidence of tricuspid stenosis. Aortic Valve: The aortic valve is normal in structure. Aortic valve regurgitation is not visualized. No aortic stenosis is present. Pulmonic Valve: The pulmonic valve was normal in structure. Pulmonic valve regurgitation is not visualized. No evidence of pulmonic stenosis. Aorta: The aortic root is normal in size and structure. Venous: The inferior vena cava is normal in size with greater than 50% respiratory variability, suggesting right atrial pressure of 3 mmHg. IAS/Shunts: No atrial level shunt detected by color flow Doppler.  LEFT VENTRICLE PLAX 2D LVIDd:         4.20 cm LVIDs:         1.80 cm LV PW:         1.20 cm LV IVS:        1.20 cm LVOT diam:     1.90 cm LV SV:         78 LV SV Index:   45 LVOT Area:     2.84 cm  RIGHT VENTRICLE RV S prime:     17.70 cm/s TAPSE (M-mode): 2.5 cm LEFT ATRIUM             Index        RIGHT ATRIUM            Index LA Vol (A2C):   22.3 ml 12.88 ml/m  RA Area:     15.20 cm LA Vol (A4C):   32.8 ml 18.95 ml/m  RA Volume:   32.00 ml  18.49 ml/m LA Biplane Vol: 29.3 ml 16.93 ml/m  AORTIC VALVE LVOT Vmax:   143.00 cm/s LVOT Vmean:  100.000 cm/s LVOT VTI:    0.276 m  AORTA Ao Root diam: 3.20 cm Ao Asc diam:  3.20 cm  SHUNTS Systemic VTI:  0.28 m Systemic Diam: 1.90 cm Christine Si MD Electronically signed by Christine Si MD Signature Date/Time: 02/07/2023/5:23:03 PM    Final    VAS [place] LOWER EXTREMITY VENOUS (DVT)  Result Date: 02/07/2023  Lower Venous DVT Study Patient Name:  [name]  Date of Exam:   02/07/2023 Medical Rec #: [id number]      Accession #:    [id number] Date of Birth: 1971-02-27      Patient Gender: F Patient Age:   52 years Exam Location:  [facility] Procedure:      VAS [place] LOWER EXTREMITY VENOUS (DVT)  valve regurgitation is not visualized. No aortic stenosis is present.  6. The inferior vena cava is normal in size with greater than 50% respiratory variability, suggesting right atrial pressure of 3 mmHg. FINDINGS  Left Ventricle: Left  ventricular ejection fraction, by estimation, is >75%. The left ventricle has hyperdynamic function. The left ventricle has no regional wall motion abnormalities. The left ventricular internal cavity size was normal in size. There is mild concentric left ventricular hypertrophy. Left ventricular diastolic parameters are indeterminate. Right Ventricle: The right ventricular size is normal. No increase in right ventricular wall thickness. Right ventricular systolic function is normal. Left Atrium: Left atrial size was normal in size. Right Atrium: Right atrial size was normal in size. Pericardium: Pericardial effusion measuring up to 0.9 cm. A moderately sized pericardial effusion is present. There is diastolic collapse of the right ventricular free wall. There is no evidence of cardiac tamponade. Mitral Valve: The mitral valve is normal in structure. No evidence of mitral valve regurgitation. No evidence of mitral valve stenosis. Tricuspid Valve: The tricuspid valve is normal in structure. Tricuspid valve regurgitation is not demonstrated. No evidence of tricuspid stenosis. Aortic Valve: The aortic valve is normal in structure. Aortic valve regurgitation is not visualized. No aortic stenosis is present. Pulmonic Valve: The pulmonic valve was normal in structure. Pulmonic valve regurgitation is not visualized. No evidence of pulmonic stenosis. Aorta: The aortic root is normal in size and structure. Venous: The inferior vena cava is normal in size with greater than 50% respiratory variability, suggesting right atrial pressure of 3 mmHg. IAS/Shunts: No atrial level shunt detected by color flow Doppler.  LEFT VENTRICLE PLAX 2D LVIDd:         4.20 cm LVIDs:         1.80 cm LV PW:         1.20 cm LV IVS:        1.20 cm LVOT diam:     1.90 cm LV SV:         78 LV SV Index:   45 LVOT Area:     2.84 cm  RIGHT VENTRICLE RV S prime:     17.70 cm/s TAPSE (M-mode): 2.5 cm LEFT ATRIUM             Index        RIGHT ATRIUM            Index LA Vol (A2C):   22.3 ml 12.88 ml/m  RA Area:     15.20 cm LA Vol (A4C):   32.8 ml 18.95 ml/m  RA Volume:   32.00 ml  18.49 ml/m LA Biplane Vol: 29.3 ml 16.93 ml/m  AORTIC VALVE LVOT Vmax:   143.00 cm/s LVOT Vmean:  100.000 cm/s LVOT VTI:    0.276 m  AORTA Ao Root diam: 3.20 cm Ao Asc diam:  3.20 cm  SHUNTS Systemic VTI:  0.28 m Systemic Diam: 1.90 cm Christine Si MD Electronically signed by Christine Si MD Signature Date/Time: 02/07/2023/5:23:03 PM    Final    VAS [place] LOWER EXTREMITY VENOUS (DVT)  Result Date: 02/07/2023  Lower Venous DVT Study Patient Name:  [name]  Date of Exam:   02/07/2023 Medical Rec #: [id number]      Accession #:    [id number] Date of Birth: 1971-02-27      Patient Gender: F Patient Age:   52 years Exam Location:  [facility] Procedure:      VAS [place] LOWER EXTREMITY VENOUS (DVT)  valve regurgitation is not visualized. No aortic stenosis is present.  6. The inferior vena cava is normal in size with greater than 50% respiratory variability, suggesting right atrial pressure of 3 mmHg. FINDINGS  Left Ventricle: Left  ventricular ejection fraction, by estimation, is >75%. The left ventricle has hyperdynamic function. The left ventricle has no regional wall motion abnormalities. The left ventricular internal cavity size was normal in size. There is mild concentric left ventricular hypertrophy. Left ventricular diastolic parameters are indeterminate. Right Ventricle: The right ventricular size is normal. No increase in right ventricular wall thickness. Right ventricular systolic function is normal. Left Atrium: Left atrial size was normal in size. Right Atrium: Right atrial size was normal in size. Pericardium: Pericardial effusion measuring up to 0.9 cm. A moderately sized pericardial effusion is present. There is diastolic collapse of the right ventricular free wall. There is no evidence of cardiac tamponade. Mitral Valve: The mitral valve is normal in structure. No evidence of mitral valve regurgitation. No evidence of mitral valve stenosis. Tricuspid Valve: The tricuspid valve is normal in structure. Tricuspid valve regurgitation is not demonstrated. No evidence of tricuspid stenosis. Aortic Valve: The aortic valve is normal in structure. Aortic valve regurgitation is not visualized. No aortic stenosis is present. Pulmonic Valve: The pulmonic valve was normal in structure. Pulmonic valve regurgitation is not visualized. No evidence of pulmonic stenosis. Aorta: The aortic root is normal in size and structure. Venous: The inferior vena cava is normal in size with greater than 50% respiratory variability, suggesting right atrial pressure of 3 mmHg. IAS/Shunts: No atrial level shunt detected by color flow Doppler.  LEFT VENTRICLE PLAX 2D LVIDd:         4.20 cm LVIDs:         1.80 cm LV PW:         1.20 cm LV IVS:        1.20 cm LVOT diam:     1.90 cm LV SV:         78 LV SV Index:   45 LVOT Area:     2.84 cm  RIGHT VENTRICLE RV S prime:     17.70 cm/s TAPSE (M-mode): 2.5 cm LEFT ATRIUM             Index        RIGHT ATRIUM            Index LA Vol (A2C):   22.3 ml 12.88 ml/m  RA Area:     15.20 cm LA Vol (A4C):   32.8 ml 18.95 ml/m  RA Volume:   32.00 ml  18.49 ml/m LA Biplane Vol: 29.3 ml 16.93 ml/m  AORTIC VALVE LVOT Vmax:   143.00 cm/s LVOT Vmean:  100.000 cm/s LVOT VTI:    0.276 m  AORTA Ao Root diam: 3.20 cm Ao Asc diam:  3.20 cm  SHUNTS Systemic VTI:  0.28 m Systemic Diam: 1.90 cm Christine Si MD Electronically signed by Christine Si MD Signature Date/Time: 02/07/2023/5:23:03 PM    Final    VAS [place] LOWER EXTREMITY VENOUS (DVT)  Result Date: 02/07/2023  Lower Venous DVT Study Patient Name:  [name]  Date of Exam:   02/07/2023 Medical Rec #: [id number]      Accession #:    [id number] Date of Birth: 1971-02-27      Patient Gender: F Patient Age:   52 years Exam Location:  [facility] Procedure:      VAS [place] LOWER EXTREMITY VENOUS (DVT)  valve regurgitation is not visualized. No aortic stenosis is present.  6. The inferior vena cava is normal in size with greater than 50% respiratory variability, suggesting right atrial pressure of 3 mmHg. FINDINGS  Left Ventricle: Left  ventricular ejection fraction, by estimation, is >75%. The left ventricle has hyperdynamic function. The left ventricle has no regional wall motion abnormalities. The left ventricular internal cavity size was normal in size. There is mild concentric left ventricular hypertrophy. Left ventricular diastolic parameters are indeterminate. Right Ventricle: The right ventricular size is normal. No increase in right ventricular wall thickness. Right ventricular systolic function is normal. Left Atrium: Left atrial size was normal in size. Right Atrium: Right atrial size was normal in size. Pericardium: Pericardial effusion measuring up to 0.9 cm. A moderately sized pericardial effusion is present. There is diastolic collapse of the right ventricular free wall. There is no evidence of cardiac tamponade. Mitral Valve: The mitral valve is normal in structure. No evidence of mitral valve regurgitation. No evidence of mitral valve stenosis. Tricuspid Valve: The tricuspid valve is normal in structure. Tricuspid valve regurgitation is not demonstrated. No evidence of tricuspid stenosis. Aortic Valve: The aortic valve is normal in structure. Aortic valve regurgitation is not visualized. No aortic stenosis is present. Pulmonic Valve: The pulmonic valve was normal in structure. Pulmonic valve regurgitation is not visualized. No evidence of pulmonic stenosis. Aorta: The aortic root is normal in size and structure. Venous: The inferior vena cava is normal in size with greater than 50% respiratory variability, suggesting right atrial pressure of 3 mmHg. IAS/Shunts: No atrial level shunt detected by color flow Doppler.  LEFT VENTRICLE PLAX 2D LVIDd:         4.20 cm LVIDs:         1.80 cm LV PW:         1.20 cm LV IVS:        1.20 cm LVOT diam:     1.90 cm LV SV:         78 LV SV Index:   45 LVOT Area:     2.84 cm  RIGHT VENTRICLE RV S prime:     17.70 cm/s TAPSE (M-mode): 2.5 cm LEFT ATRIUM             Index        RIGHT ATRIUM            Index LA Vol (A2C):   22.3 ml 12.88 ml/m  RA Area:     15.20 cm LA Vol (A4C):   32.8 ml 18.95 ml/m  RA Volume:   32.00 ml  18.49 ml/m LA Biplane Vol: 29.3 ml 16.93 ml/m  AORTIC VALVE LVOT Vmax:   143.00 cm/s LVOT Vmean:  100.000 cm/s LVOT VTI:    0.276 m  AORTA Ao Root diam: 3.20 cm Ao Asc diam:  3.20 cm  SHUNTS Systemic VTI:  0.28 m Systemic Diam: 1.90 cm Christine Si MD Electronically signed by Christine Si MD Signature Date/Time: 02/07/2023/5:23:03 PM    Final    VAS [place] LOWER EXTREMITY VENOUS (DVT)  Result Date: 02/07/2023  Lower Venous DVT Study Patient Name:  [name]  Date of Exam:   02/07/2023 Medical Rec #: [id number]      Accession #:    [id number] Date of Birth: 1971-02-27      Patient Gender: F Patient Age:   52 years Exam Location:  [facility] Procedure:      VAS [place] LOWER EXTREMITY VENOUS (DVT)  PROGRESS NOTE    [name]  JYN:[id number] DOB: 11/03/1970 DOA: 01/27/2023 PCP: [name], PA-C    Chief Complaint  Patient presents with   Respiratory Arrest    Brief Narrative:   This is a 52 year old female with past medical history of COPD, Crohn's on Skyrizi, hypertension, type 2 diabetes mellitus who initially presented to the after being found unresponsive. Per family, patient has had bloody emesis with diarrhea over the past few days. Initial systolics in the 60s in the ED, and patient was minimally responsive. In the emergency room, patient was intubated, and patient was admitted to the ICU for further evaluation and management for shock and altered mental status. She had concern for ongoing colitis. Patient was taken for flexible sigmoidoscopy by GI yesterday. This revealed mucosal erosions consistent with her history of Crohn's. They started her on vancomycin with her history of C. difficile and decided to hold off on steroids.  -Improved and transferred to [facility] service-progressive care 10//2024.  Assessment & Plan:   Principal Problem:   Cardiac arrest [facility]) Active Problems:   Metabolic encephalopathy   Shock (HCC)   Acute respiratory failure with hypoxia (HCC)   Upper GI bleed   Granulomatous colitis (HCC)   Pseudomembranous colitis   Crohn's disease without complication (HCC)       Cardiac arrest -Required intubation on presentation, She is successfully extubated, appears to be improving   Infectious colitis/membranous colitis due to C. Difficile - flexible sigmoidoscopy on 10/2 with severe colitis with pseudomembranous features. -Despite negative C. difficile toxin, but positive antigen, but clinical sensations and findings on sigmoidoscopy, concerning for C. difficile colitis, started on p.o. vancomycin -She finished total 10 days of IV Rocephin and Flagyl. -Vancomycin day 7 of 14, dose is increased to 500 mg p.o. 4 times daily.  GI -GI following,  appreciate recommendations -Peers to be having ileus this morning on imaging, abdomen is distended, recommendation to increase vancomycin dosing, remove rectal tube-Flexi-Seal, resume and phosphorus within normal range.  Ileus -See above discussion   Type 2 diabetes mellitus.  -Continue with insulin sliding scale for now -CBG remains significantly elevated, continue with insulin sliding scale but will add Semglee 5 units twice daily   History of seizures Still on Keppra.  No concern for seizure-like activity.  Doing well. -Continue Keppra   Anxiety Given altered mental status, will continue to hold Xanax.   Hypertension -Continue with metoprolol.    Sinus tachycardia -Urology input greatly appreciated, multifactorial due to acute illness, infection, ileus and mild anemia  -Continue with metoprolol succinate 50 mg twice daily  -CTA chest PE protocol is negative - TSH is reassuring at 4.878, will recheck free T4 tomorrow a.m.  Pericardial effusion -No tamponade, cardiology input appreciated  Elevated D-dimers  -THS negative for PE, venous Dopplers negative for DVT, this is most likely due to sepsis.  Prolonged QTc - Will repeat EKG, keep magnesium >2 and potassium> 4   Acute encephalopathy Dysphagia -Patient remains with slurred speech, mild confusion, but mentation appears to continue to improve, most likely in the setting of septic shock, postcardiac arrest, will consult SLP regarding cognition/speech evaluation -SLP following closely    COPD No concern for exacerbation at this time.  Will continue with as needed albuterol.   Nutrition PCM -She remains on tube feed, started on a diet, but oral intake remains poor, so continue with tube feed for now -Albumin significantly low, she is with anasarca, started on IV albumin  DVT prophylaxis: Heparin  valve regurgitation is not visualized. No aortic stenosis is present.  6. The inferior vena cava is normal in size with greater than 50% respiratory variability, suggesting right atrial pressure of 3 mmHg. FINDINGS  Left Ventricle: Left  ventricular ejection fraction, by estimation, is >75%. The left ventricle has hyperdynamic function. The left ventricle has no regional wall motion abnormalities. The left ventricular internal cavity size was normal in size. There is mild concentric left ventricular hypertrophy. Left ventricular diastolic parameters are indeterminate. Right Ventricle: The right ventricular size is normal. No increase in right ventricular wall thickness. Right ventricular systolic function is normal. Left Atrium: Left atrial size was normal in size. Right Atrium: Right atrial size was normal in size. Pericardium: Pericardial effusion measuring up to 0.9 cm. A moderately sized pericardial effusion is present. There is diastolic collapse of the right ventricular free wall. There is no evidence of cardiac tamponade. Mitral Valve: The mitral valve is normal in structure. No evidence of mitral valve regurgitation. No evidence of mitral valve stenosis. Tricuspid Valve: The tricuspid valve is normal in structure. Tricuspid valve regurgitation is not demonstrated. No evidence of tricuspid stenosis. Aortic Valve: The aortic valve is normal in structure. Aortic valve regurgitation is not visualized. No aortic stenosis is present. Pulmonic Valve: The pulmonic valve was normal in structure. Pulmonic valve regurgitation is not visualized. No evidence of pulmonic stenosis. Aorta: The aortic root is normal in size and structure. Venous: The inferior vena cava is normal in size with greater than 50% respiratory variability, suggesting right atrial pressure of 3 mmHg. IAS/Shunts: No atrial level shunt detected by color flow Doppler.  LEFT VENTRICLE PLAX 2D LVIDd:         4.20 cm LVIDs:         1.80 cm LV PW:         1.20 cm LV IVS:        1.20 cm LVOT diam:     1.90 cm LV SV:         78 LV SV Index:   45 LVOT Area:     2.84 cm  RIGHT VENTRICLE RV S prime:     17.70 cm/s TAPSE (M-mode): 2.5 cm LEFT ATRIUM             Index        RIGHT ATRIUM            Index LA Vol (A2C):   22.3 ml 12.88 ml/m  RA Area:     15.20 cm LA Vol (A4C):   32.8 ml 18.95 ml/m  RA Volume:   32.00 ml  18.49 ml/m LA Biplane Vol: 29.3 ml 16.93 ml/m  AORTIC VALVE LVOT Vmax:   143.00 cm/s LVOT Vmean:  100.000 cm/s LVOT VTI:    0.276 m  AORTA Ao Root diam: 3.20 cm Ao Asc diam:  3.20 cm  SHUNTS Systemic VTI:  0.28 m Systemic Diam: 1.90 cm Christine Si MD Electronically signed by Christine Si MD Signature Date/Time: 02/07/2023/5:23:03 PM    Final    VAS [place] LOWER EXTREMITY VENOUS (DVT)  Result Date: 02/07/2023  Lower Venous DVT Study Patient Name:  [name]  Date of Exam:   02/07/2023 Medical Rec #: [id number]      Accession #:    [id number] Date of Birth: 1971-02-27      Patient Gender: F Patient Age:   52 years Exam Location:  [facility] Procedure:      VAS [place] LOWER EXTREMITY VENOUS (DVT)  valve regurgitation is not visualized. No aortic stenosis is present.  6. The inferior vena cava is normal in size with greater than 50% respiratory variability, suggesting right atrial pressure of 3 mmHg. FINDINGS  Left Ventricle: Left  ventricular ejection fraction, by estimation, is >75%. The left ventricle has hyperdynamic function. The left ventricle has no regional wall motion abnormalities. The left ventricular internal cavity size was normal in size. There is mild concentric left ventricular hypertrophy. Left ventricular diastolic parameters are indeterminate. Right Ventricle: The right ventricular size is normal. No increase in right ventricular wall thickness. Right ventricular systolic function is normal. Left Atrium: Left atrial size was normal in size. Right Atrium: Right atrial size was normal in size. Pericardium: Pericardial effusion measuring up to 0.9 cm. A moderately sized pericardial effusion is present. There is diastolic collapse of the right ventricular free wall. There is no evidence of cardiac tamponade. Mitral Valve: The mitral valve is normal in structure. No evidence of mitral valve regurgitation. No evidence of mitral valve stenosis. Tricuspid Valve: The tricuspid valve is normal in structure. Tricuspid valve regurgitation is not demonstrated. No evidence of tricuspid stenosis. Aortic Valve: The aortic valve is normal in structure. Aortic valve regurgitation is not visualized. No aortic stenosis is present. Pulmonic Valve: The pulmonic valve was normal in structure. Pulmonic valve regurgitation is not visualized. No evidence of pulmonic stenosis. Aorta: The aortic root is normal in size and structure. Venous: The inferior vena cava is normal in size with greater than 50% respiratory variability, suggesting right atrial pressure of 3 mmHg. IAS/Shunts: No atrial level shunt detected by color flow Doppler.  LEFT VENTRICLE PLAX 2D LVIDd:         4.20 cm LVIDs:         1.80 cm LV PW:         1.20 cm LV IVS:        1.20 cm LVOT diam:     1.90 cm LV SV:         78 LV SV Index:   45 LVOT Area:     2.84 cm  RIGHT VENTRICLE RV S prime:     17.70 cm/s TAPSE (M-mode): 2.5 cm LEFT ATRIUM             Index        RIGHT ATRIUM            Index LA Vol (A2C):   22.3 ml 12.88 ml/m  RA Area:     15.20 cm LA Vol (A4C):   32.8 ml 18.95 ml/m  RA Volume:   32.00 ml  18.49 ml/m LA Biplane Vol: 29.3 ml 16.93 ml/m  AORTIC VALVE LVOT Vmax:   143.00 cm/s LVOT Vmean:  100.000 cm/s LVOT VTI:    0.276 m  AORTA Ao Root diam: 3.20 cm Ao Asc diam:  3.20 cm  SHUNTS Systemic VTI:  0.28 m Systemic Diam: 1.90 cm Christine Si MD Electronically signed by Christine Si MD Signature Date/Time: 02/07/2023/5:23:03 PM    Final    VAS [place] LOWER EXTREMITY VENOUS (DVT)  Result Date: 02/07/2023  Lower Venous DVT Study Patient Name:  [name]  Date of Exam:   02/07/2023 Medical Rec #: [id number]      Accession #:    [id number] Date of Birth: 1971-02-27      Patient Gender: F Patient Age:   52 years Exam Location:  [facility] Procedure:      VAS [place] LOWER EXTREMITY VENOUS (DVT)  Date: 02/08/2023 CLINICAL DATA:  High probability pulmonary embolism, sinus tachycardia, elevated D-dimer EXAM: CT ANGIOGRAPHY CHEST WITH CONTRAST TECHNIQUE: Multidetector CT imaging of the chest was performed using the standard protocol during bolus administration of intravenous contrast. Multiplanar CT image reconstructions and MIPs were obtained to evaluate the vascular anatomy. RADIATION DOSE REDUCTION: This exam was performed according to the departmental dose-optimization program which includes automated exposure control, adjustment of the mA and/or kV according to patient size and/or use of iterative reconstruction technique. CONTRAST:  75mL OMNIPAQUE IOHEXOL 350 MG/ML SOLN COMPARISON:  None Available. FINDINGS: Cardiovascular: No significant coronary artery calcification. Global cardiac size is within normal limits. Small pericardial effusion. Central pulmonary arteries are enlarged in keeping with changes of pulmonary arterial hypertension. No intraluminal filling defect identified through the segmental level to suggest acute pulmonary embolism. The thoracic aorta is unremarkable. Mediastinum/Nodes: Visualized thyroid is unremarkable. No pathologic thoracic adenopathy. Nasoenteric feeding tube extends into the upper abdomen beyond the margin examination. Esophagus unremarkable. Lungs/Pleura: Small right pleural effusion. Mild bibasilar atelectasis. Lungs are otherwise clear. No pneumothorax. No central obstructing lesion. Upper Abdomen: No acute abnormality. Musculoskeletal: No chest wall abnormality. No acute or significant osseous findings. Review of the MIP images confirms the above findings. IMPRESSION: 1. No pulmonary embolism. 2. Small right pleural effusion. 3. Small  pericardial effusion. 4. Enlarged central pulmonary arteries in keeping with changes of pulmonary arterial hypertension. Electronically Signed   By: Helyn Numbers M.D.   On: 02/08/2023 00:27   ECHOCARDIOGRAM COMPLETE  Result Date: 02/07/2023    ECHOCARDIOGRAM REPORT   Patient Name:   [name] Date of Exam: 02/07/2023 Medical Rec #:  [id number]     Height:       63.0 in Accession #:    [id number]    Weight:       154.1 lb Date of Birth:  Feb 12, 1971     BSA:          1.731 m Patient Age:    51 years      BP:           102/62 mmHg Patient Gender: F             HR:           135 bpm. Exam Location:  Inpatient Procedure: 2D Echo, Color Doppler and Cardiac Doppler Indications:    Sinus Tachycardia  History:        Patient has prior history of Echocardiogram examinations, most                 recent 03/10/2022. Infectious Colitis and COPD,                 Arrythmias:Cardiac Arrest and Tachycardia; Risk Factors:Current                 Smoker.  Sonographer:    Milbert Coulter Referring Phys: [id number] Ichael Pullara S Renji Berwick IMPRESSIONS  1. Left ventricular ejection fraction, by estimation, is >75%. The left ventricle has hyperdynamic function. The left ventricle has no regional wall motion abnormalities. There is mild concentric left ventricular hypertrophy. Left ventricular diastolic parameters are indeterminate.  2. Right ventricular systolic function is normal. The right ventricular size is normal.  3. Pericardial effusion measuring up to 0.9 cm. Moderate pericardial effusion. There is no evidence of cardiac tamponade.  4. The mitral valve is normal in structure. No evidence of mitral valve regurgitation. No evidence of mitral stenosis.  5. The aortic valve is normal in structure. Aortic

## 2023-02-08 NOTE — Plan of Care (Signed)
  Problem: Education: Goal: Knowledge of General Education information will improve Description: Including pain rating scale, medication(s)/side effects and non-pharmacologic comfort measures Outcome: Progressing   Problem: Clinical Measurements: Goal: Ability to maintain clinical measurements within normal limits will improve Outcome: Progressing Goal: Will remain free from infection Outcome: Progressing Goal: Diagnostic test results will improve Outcome: Progressing Goal: Cardiovascular complication will be avoided Outcome: Progressing   Problem: Activity: Goal: Risk for activity intolerance will decrease Outcome: Progressing   Problem: Nutrition: Goal: Adequate nutrition will be maintained Outcome: Progressing   Problem: Coping: Goal: Level of anxiety will decrease Outcome: Progressing

## 2023-02-08 NOTE — Progress Notes (Signed)
Nutrition Follow-up  DOCUMENTATION CODES:   Not applicable  INTERVENTION:  Change to nocturnal  tube feeding via Cortrak: Osmolite 1.5  at 75 ml/h x 12hr (900 ml per day) Prosource TF20 60 ml 1 x day Banatrol TF 60ml BID FWF- every 8 hr   Provides 1430 kcal, 77 gm protein, 686 ml + fwf= free water daily    NUTRITION DIAGNOSIS:   Inadequate oral intake related to inability to eat as evidenced by NPO status.    GOAL:   Patient will meet greater than or equal to 90% of their needs    MONITOR:   Diet advancement, Skin, Labs, Weight trends, TF tolerance  REASON FOR ASSESSMENT:   Consult Assessment of nutrition requirement/status  ASSESSMENT:   52 year old female with past medical history of COPD, Crohn's on Skyrizi, hypertension, type 2 diabetes mellitus who initially presented to the after being found unresponsive. Diet advanced to Full liquid.  Poor intake. Cortrak still in place: Osmolite 1.2 ar 49ml/hr. Prosource 60ml 1 x day.  9/30 Cortrak placed 10/4 diet advance;Full liquid Pt out of room at time of visit. During rounds MD in agreement to nocturnal feedings.  Admit weight: 63.5 kg Current weight: 69.9 kg  Weight history: 02/07/23 69.9 kg  11/13/22 63.5 kg  03/11/22 58.5 kg  02/23/22 59 kg     Average Meal Intake: Last documented meal 10% (10/5)  Nutritionally Relevant Medications: Scheduled Meds:  feeding supplement  237 mL Oral BID BM   feeding supplement (PROSource TF20)  60 mL Per Tube Daily   fiber supplement (BANATROL TF)  60 mL Per Tube BID   insulin aspart  0-9 Units Subcutaneous Q4H   insulin glargine-yfgn  5 Units Subcutaneous BID   vancomycin  125 mg Per Tube Q6H    Continuous Infusions:  feeding supplement (OSMOLITE 1.2 CAL) 1,000 mL (02/08/23 0005)    Labs Reviewed: CBG ranges from 136-156 mg/dL over the last 24 hours HgbA1c 9    NUTRITION - FOCUSED PHYSICAL EXAM:  Flowsheet Row Most Recent Value  Orbital  Region No depletion  Upper Arm Region No depletion  Thoracic and Lumbar Region No depletion  Buccal Region No depletion  Temple Region Mild depletion  Clavicle Bone Region No depletion  Clavicle and Acromion Bone Region No depletion  Scapular Bone Region Unable to assess  Dorsal Hand No depletion  Patellar Region Mild depletion  Anterior Thigh Region Mild depletion  Posterior Calf Region Moderate depletion  Edema (RD Assessment) Mild  [forearms]  Hair Reviewed  Eyes Reviewed  Mouth Reviewed  Skin Reviewed  Nails Reviewed       Diet Order:   Diet Order             Diet full liquid Fluid consistency: Thin  Diet effective now                   EDUCATION NEEDS:   Not appropriate for education at this time  Skin:  Skin Assessment: Reviewed RN Assessment  Last BM:  10/7  Height:   Ht Readings from Last 1 Encounters:  02/02/23 5' 2.99" (1.6 m)    Weight:   Wt Readings from Last 1 Encounters:  02/07/23 69.9 kg    Ideal Body Weight:  52.3 kg  BMI:  Body mass index is 27.31 kg/m.  Estimated Nutritional Needs:   Kcal:  1700-1900 kcal/d  Protein:  85-100 g/d  Fluid:  >1.8L/d    Jamelle Haring RDN, LDN Clinical  Dietitian  RDN pager # available on Amion

## 2023-02-09 ENCOUNTER — Inpatient Hospital Stay (HOSPITAL_COMMUNITY): Payer: Medicaid Other

## 2023-02-09 DIAGNOSIS — K567 Ileus, unspecified: Secondary | ICD-10-CM | POA: Diagnosis not present

## 2023-02-09 DIAGNOSIS — G9341 Metabolic encephalopathy: Secondary | ICD-10-CM | POA: Diagnosis not present

## 2023-02-09 DIAGNOSIS — J9601 Acute respiratory failure with hypoxia: Secondary | ICD-10-CM | POA: Diagnosis not present

## 2023-02-09 DIAGNOSIS — K5010 Crohn's disease of large intestine without complications: Secondary | ICD-10-CM | POA: Diagnosis not present

## 2023-02-09 DIAGNOSIS — A0472 Enterocolitis due to Clostridium difficile, not specified as recurrent: Secondary | ICD-10-CM | POA: Diagnosis not present

## 2023-02-09 DIAGNOSIS — I469 Cardiac arrest, cause unspecified: Secondary | ICD-10-CM | POA: Diagnosis not present

## 2023-02-09 LAB — MAGNESIUM: Magnesium: 2.1 mg/dL (ref 1.7–2.4)

## 2023-02-09 LAB — GLUCOSE, CAPILLARY
Glucose-Capillary: 203 mg/dL — ABNORMAL HIGH (ref 70–99)
Glucose-Capillary: 192 mg/dL — ABNORMAL HIGH (ref 70–99)
Glucose-Capillary: 251 mg/dL — ABNORMAL HIGH (ref 70–99)
Glucose-Capillary: 72 mg/dL (ref 70–99)
Glucose-Capillary: 187 mg/dL — ABNORMAL HIGH (ref 70–99)
Glucose-Capillary: 175 mg/dL — ABNORMAL HIGH (ref 70–99)

## 2023-02-09 LAB — CBC
HCT: 32.5 % — ABNORMAL LOW (ref 36.0–46.0)
Hemoglobin: 10.5 g/dL — ABNORMAL LOW (ref 12.0–15.0)
MCH: 28.4 pg (ref 26.0–34.0)
MCHC: 32.3 g/dL (ref 30.0–36.0)
MCV: 87.8 fL (ref 80.0–100.0)
Platelets: 1050 10*3/uL (ref 150–400)
RBC: 3.7 MIL/uL — ABNORMAL LOW (ref 3.87–5.11)
RDW: 15.5 % (ref 11.5–15.5)
WBC: 12.6 10*3/uL — ABNORMAL HIGH (ref 4.0–10.5)
nRBC: 0.2 % (ref 0.0–0.2)

## 2023-02-09 LAB — BASIC METABOLIC PANEL
Anion gap: 11 (ref 5–15)
BUN: 11 mg/dL (ref 6–20)
CO2: 20 mmol/L — ABNORMAL LOW (ref 22–32)
Calcium: 8.7 mg/dL — ABNORMAL LOW (ref 8.9–10.3)
Chloride: 100 mmol/L (ref 98–111)
Creatinine, Ser: 0.55 mg/dL (ref 0.44–1.00)
GFR, Estimated: >60 mL/min (ref >60)
Glucose, Bld: 217 mg/dL — ABNORMAL HIGH (ref 70–99)
Potassium: 4.7 mmol/L (ref 3.5–5.1)
Sodium: 131 mmol/L — ABNORMAL LOW (ref 135–145)

## 2023-02-09 LAB — PROCALCITONIN: Procalcitonin: 0.29 ng/mL

## 2023-02-09 LAB — PHOSPHORUS: Phosphorus: 3.6 mg/dL (ref 2.5–4.6)

## 2023-02-09 MED ORDER — OSMOLITE 1.5 CAL PO LIQD
1000.0000 mL | ORAL | Status: AC
  Administered 2023-02-09: 1000 mL
  Filled 2023-02-09: qty 1000

## 2023-02-09 MED ORDER — ONDANSETRON HCL 4 MG/2ML IJ SOLN
4.0000 mg | Freq: Four times a day (QID) | INTRAMUSCULAR | Status: DC | PRN
  Administered 2023-02-09 – 2023-02-11 (×2): 4 mg via INTRAVENOUS
  Filled 2023-02-09 (×2): qty 2

## 2023-02-09 MED ORDER — OSMOLITE 1.5 CAL PO LIQD: 1000.0000 mL | ORAL | Status: DC

## 2023-02-09 NOTE — Progress Notes (Addendum)
Patient ID: [name], female   DOB: [birth year], 52 y.o.   MRN: [id number]    Progress Note   Subjective   Day # 14 CC; admit with septic shock/cardiac arrest, history of Crohn's disease on Skyrizi Suspected C. difficile colitis-flex sigmoid 02/02/2023  Oral vancomycin day #7-dose to 500 mg p.o. 4 times daily yesterday due to new ileus Continues on tube feedings at 60 cc/h  KUB -today-report pending-moderate ileus on films yesterday  Flexi-Seal was to be discontinued yesterday, she has another rectal tube in place today having liquid stool around the rectal tube and into the bag. She says she feels about the same, nauseated, no appetite, no vomiting says abdominal pain about the same-she is unaware that she is stooling around the rectal tube  WBC 12.6/hemoglobin 10.5 Sodium 131/potassium 4.7 BUN 11/creatinine 0.55 Magnesium 2.1 proCalcitonin 0.29   Objective   Vital signs in last 24 hours: Temp:  [98.4 F (36.9 C)-98.7 F (37.1 C)] 98.4 F (36.9 C) (10/08 2300) Pulse Rate:  [129-143] 143 (10/08 1930) Resp:  [20] 20 (10/08 1930) BP: (126-128)/(52-58) 128/58 (10/08 1930) SpO2:  [100 %] 100 % (10/08 1930) Weight:  [66.3 kg] 66.3 kg (10/09 0300) Last BM Date : 02/08/23 General:   Chronically ill-appearing white female in NAD, sitting up in chair Heart: Tachy  regular rate and rhythm; no murmurs Lungs: Respirations even and unlabored, lungs CTA bilaterally Abdomen: Protuberant, tender across the mid and lower abdomen bowel sounds are present, maybe a bit softer than yesterday. Extremities:  Without edema. Neurologic:  Alert and oriented,  grossly normal neurologically, Tatian slow but appropriate,. Psych:  Cooperative. Normal mood and affect.  Intake/Output from previous day: 10/08 0701 - 10/09 0700 In: -  Out: 1000 [Urine:1000] Intake/Output this shift: No intake/output data recorded.  Lab Results: Recent Labs    02/08/23 0352 02/08/23 0727 02/09/23 0430  WBC  13.1* 12.6* 12.6*  HGB 10.4* 11.1* 10.5*  HCT 32.5* 34.6* 32.5*  PLT 1,031* 806* 1,050*   BMET Recent Labs    02/07/23 0746 02/08/23 0352 02/09/23 0430  NA 132* 135 131*  K 4.0 4.3 4.7  CL 101 103 100  CO2 24 21* 20*  GLUCOSE 136* 156* 217*  BUN 6 6 11   CREATININE 0.47 0.62 0.55  CALCIUM 8.4* 8.6* 8.7*   LFT No results for input(s): "PROT", "ALBUMIN", "AST", "ALT", "ALKPHOS", "BILITOT", "BILIDIR", "IBILI" in the last 72 hours. PT/INR No results for input(s): "LABPROT", "INR" in the last 72 hours.  Studies/Results: DG Abd 2 Views  Result Date: 02/08/2023 CLINICAL DATA:  Ileus.  Acute generalized abdominal pain. EXAM: ABDOMEN - 2 VIEW COMPARISON:  Abdominal radiograph 01/31/2023 FINDINGS: A feeding tube remains in place with tip in the region of the distal stomach or proximal duodenum. No intraperitoneal free air is identified. There is moderate gaseous distension of colon and likely mild distension of some small bowel loops. Right upper quadrant abdominal surgical clips are noted. No acute osseous abnormality is seen. IMPRESSION: Mild-to-moderate bowel distension suggestive of ileus. Electronically Signed   By: Sebastian Ache M.D.   On: 02/08/2023 12:42   CT Angio Chest Pulmonary Embolism (PE) W or WO Contrast  Result Date: 02/08/2023 CLINICAL DATA:  High probability pulmonary embolism, sinus tachycardia, elevated D-dimer EXAM: CT ANGIOGRAPHY CHEST WITH CONTRAST TECHNIQUE: Multidetector CT imaging of the chest was performed using the standard protocol during bolus administration of intravenous contrast. Multiplanar CT image reconstructions and MIPs were obtained to evaluate the vascular anatomy. RADIATION DOSE  REDUCTION: This exam was performed according to the departmental dose-optimization program which includes automated exposure control, adjustment of the mA and/or kV according to patient size and/or use of iterative reconstruction technique. CONTRAST:  75mL OMNIPAQUE IOHEXOL 350  MG/ML SOLN COMPARISON:  None Available. FINDINGS: Cardiovascular: No significant coronary artery calcification. Global cardiac size is within normal limits. Small pericardial effusion. Central pulmonary arteries are enlarged in keeping with changes of pulmonary arterial hypertension. No intraluminal filling defect identified through the segmental level to suggest acute pulmonary embolism. The thoracic aorta is unremarkable. Mediastinum/Nodes: Visualized thyroid is unremarkable. No pathologic thoracic adenopathy. Nasoenteric feeding tube extends into the upper abdomen beyond the margin examination. Esophagus unremarkable. Lungs/Pleura: Small right pleural effusion. Mild bibasilar atelectasis. Lungs are otherwise clear. No pneumothorax. No central obstructing lesion. Upper Abdomen: No acute abnormality. Musculoskeletal: No chest wall abnormality. No acute or significant osseous findings. Review of the MIP images confirms the above findings. IMPRESSION: 1. No pulmonary embolism. 2. Small right pleural effusion. 3. Small pericardial effusion. 4. Enlarged central pulmonary arteries in keeping with changes of pulmonary arterial hypertension. Electronically Signed   By: Helyn Numbers M.D.   On: 02/08/2023 00:27   ECHOCARDIOGRAM COMPLETE  Result Date: 02/07/2023    ECHOCARDIOGRAM REPORT   Patient Name:   [name] Date of Exam: 02/07/2023 Medical Rec #:  [id number]     Height:       63.0 in Accession #:    [id number]    Weight:       154.1 lb Date of Birth:  [birth year]     BSA:          1.731 m Patient Age:    51 years      BP:           102/62 mmHg Patient Gender: F             HR:           135 bpm. Exam Location:  Inpatient Procedure: 2D Echo, Color Doppler and Cardiac Doppler Indications:    Sinus Tachycardia  History:        Patient has prior history of Echocardiogram examinations, most                 recent 03/10/2022. Infectious Colitis and COPD,                 Arrythmias:Cardiac Arrest and Tachycardia; Risk  Factors:Current                 Smoker.  Sonographer:    Milbert Coulter Referring Phys: 13 DAWOOD S ELGERGAWY IMPRESSIONS  1. Left ventricular ejection fraction, by estimation, is >75%. The left ventricle has hyperdynamic function. The left ventricle has no regional wall motion abnormalities. There is mild concentric left ventricular hypertrophy. Left ventricular diastolic parameters are indeterminate.  2. Right ventricular systolic function is normal. The right ventricular size is normal.  3. Pericardial effusion measuring up to 0.9 cm. Moderate pericardial effusion. There is no evidence of cardiac tamponade.  4. The mitral valve is normal in structure. No evidence of mitral valve regurgitation. No evidence of mitral stenosis.  5. The aortic valve is normal in structure. Aortic valve regurgitation is not visualized. No aortic stenosis is present.  6. The inferior vena cava is normal in size with greater than 50% respiratory variability, suggesting right atrial pressure of 3 mmHg. FINDINGS  Left Ventricle: Left ventricular ejection fraction, by estimation, is >75%. The left ventricle has hyperdynamic function. The  REDUCTION: This exam was performed according to the departmental dose-optimization program which includes automated exposure control, adjustment of the mA and/or kV according to patient size and/or use of iterative reconstruction technique. CONTRAST:  75mL OMNIPAQUE IOHEXOL 350  MG/ML SOLN COMPARISON:  None Available. FINDINGS: Cardiovascular: No significant coronary artery calcification. Global cardiac size is within normal limits. Small pericardial effusion. Central pulmonary arteries are enlarged in keeping with changes of pulmonary arterial hypertension. No intraluminal filling defect identified through the segmental level to suggest acute pulmonary embolism. The thoracic aorta is unremarkable. Mediastinum/Nodes: Visualized thyroid is unremarkable. No pathologic thoracic adenopathy. Nasoenteric feeding tube extends into the upper abdomen beyond the margin examination. Esophagus unremarkable. Lungs/Pleura: Small right pleural effusion. Mild bibasilar atelectasis. Lungs are otherwise clear. No pneumothorax. No central obstructing lesion. Upper Abdomen: No acute abnormality. Musculoskeletal: No chest wall abnormality. No acute or significant osseous findings. Review of the MIP images confirms the above findings. IMPRESSION: 1. No pulmonary embolism. 2. Small right pleural effusion. 3. Small pericardial effusion. 4. Enlarged central pulmonary arteries in keeping with changes of pulmonary arterial hypertension. Electronically Signed   By: Helyn Numbers M.D.   On: 02/08/2023 00:27   ECHOCARDIOGRAM COMPLETE  Result Date: 02/07/2023    ECHOCARDIOGRAM REPORT   Patient Name:   [name] Date of Exam: 02/07/2023 Medical Rec #:  [id number]     Height:       63.0 in Accession #:    [id number]    Weight:       154.1 lb Date of Birth:  [birth year]     BSA:          1.731 m Patient Age:    51 years      BP:           102/62 mmHg Patient Gender: F             HR:           135 bpm. Exam Location:  Inpatient Procedure: 2D Echo, Color Doppler and Cardiac Doppler Indications:    Sinus Tachycardia  History:        Patient has prior history of Echocardiogram examinations, most                 recent 03/10/2022. Infectious Colitis and COPD,                 Arrythmias:Cardiac Arrest and Tachycardia; Risk  Factors:Current                 Smoker.  Sonographer:    Milbert Coulter Referring Phys: 13 DAWOOD S ELGERGAWY IMPRESSIONS  1. Left ventricular ejection fraction, by estimation, is >75%. The left ventricle has hyperdynamic function. The left ventricle has no regional wall motion abnormalities. There is mild concentric left ventricular hypertrophy. Left ventricular diastolic parameters are indeterminate.  2. Right ventricular systolic function is normal. The right ventricular size is normal.  3. Pericardial effusion measuring up to 0.9 cm. Moderate pericardial effusion. There is no evidence of cardiac tamponade.  4. The mitral valve is normal in structure. No evidence of mitral valve regurgitation. No evidence of mitral stenosis.  5. The aortic valve is normal in structure. Aortic valve regurgitation is not visualized. No aortic stenosis is present.  6. The inferior vena cava is normal in size with greater than 50% respiratory variability, suggesting right atrial pressure of 3 mmHg. FINDINGS  Left Ventricle: Left ventricular ejection fraction, by estimation, is >75%. The left ventricle has hyperdynamic function. The  left ventricle has no regional wall motion abnormalities. The left ventricular internal cavity size was normal in size. There is mild concentric left ventricular hypertrophy. Left ventricular diastolic parameters are indeterminate. Right Ventricle: The right ventricular size is normal. No increase in right ventricular wall thickness. Right ventricular systolic function is normal. Left Atrium: Left atrial size was normal in size. Right Atrium: Right atrial size was normal in size. Pericardium: Pericardial effusion measuring up to 0.9 cm. A moderately sized pericardial effusion is present. There is diastolic collapse of the right ventricular free wall. There is no evidence of cardiac tamponade. Mitral Valve: The mitral valve is normal in structure. No evidence of mitral valve regurgitation. No evidence of  mitral valve stenosis. Tricuspid Valve: The tricuspid valve is normal in structure. Tricuspid valve regurgitation is not demonstrated. No evidence of tricuspid stenosis. Aortic Valve: The aortic valve is normal in structure. Aortic valve regurgitation is not visualized. No aortic stenosis is present. Pulmonic Valve: The pulmonic valve was normal in structure. Pulmonic valve regurgitation is not visualized. No evidence of pulmonic stenosis. Aorta: The aortic root is normal in size and structure. Venous: The inferior vena cava is normal in size with greater than 50% respiratory variability, suggesting right atrial pressure of 3 mmHg. IAS/Shunts: No atrial level shunt detected by color flow Doppler.  LEFT VENTRICLE PLAX 2D LVIDd:         4.20 cm LVIDs:         1.80 cm LV PW:         1.20 cm LV IVS:        1.20 cm LVOT diam:     1.90 cm LV SV:         78 LV SV Index:   45 LVOT Area:     2.84 cm  RIGHT VENTRICLE RV S prime:     17.70 cm/s TAPSE (M-mode): 2.5 cm LEFT ATRIUM             Index        RIGHT ATRIUM           Index LA Vol (A2C):   22.3 ml 12.88 ml/m  RA Area:     15.20 cm LA Vol (A4C):   32.8 ml 18.95 ml/m  RA Volume:   32.00 ml  18.49 ml/m LA Biplane Vol: 29.3 ml 16.93 ml/m  AORTIC VALVE LVOT Vmax:   143.00 cm/s LVOT Vmean:  100.000 cm/s LVOT VTI:    0.276 m  AORTA Ao Root diam: 3.20 cm Ao Asc diam:  3.20 cm  SHUNTS Systemic VTI:  0.28 m Systemic Diam: 1.90 cm Chilton Si MD Electronically signed by Chilton Si MD Signature Date/Time: 02/07/2023/5:23:03 PM    Final    VAS [place] LOWER EXTREMITY VENOUS (DVT)  Result Date: 02/07/2023  Lower Venous DVT Study Patient Name:  [name]  Date of Exam:   02/07/2023 Medical Rec #: [id number]      Accession #:    [id number] Date of Birth: Aug 02, 1970      Patient Gender: F Patient Age:   72 years Exam Location:  [facility] Procedure:      VAS [place] LOWER EXTREMITY VENOUS (DVT) Referring Phys: DAWOOD ELGERGAWY  --------------------------------------------------------------------------------  Indications: Swelling.  Risk Factors: None identified. Comparison Study: No prior studies. Performing Technologist: Chanda Busing RVT  Examination Guidelines: A complete evaluation includes B-mode imaging, spectral Doppler, color Doppler, and power Doppler as needed of all accessible portions of each vessel. Bilateral testing is considered  left ventricle has no regional wall motion abnormalities. The left ventricular internal cavity size was normal in size. There is mild concentric left ventricular hypertrophy. Left ventricular diastolic parameters are indeterminate. Right Ventricle: The right ventricular size is normal. No increase in right ventricular wall thickness. Right ventricular systolic function is normal. Left Atrium: Left atrial size was normal in size. Right Atrium: Right atrial size was normal in size. Pericardium: Pericardial effusion measuring up to 0.9 cm. A moderately sized pericardial effusion is present. There is diastolic collapse of the right ventricular free wall. There is no evidence of cardiac tamponade. Mitral Valve: The mitral valve is normal in structure. No evidence of mitral valve regurgitation. No evidence of  mitral valve stenosis. Tricuspid Valve: The tricuspid valve is normal in structure. Tricuspid valve regurgitation is not demonstrated. No evidence of tricuspid stenosis. Aortic Valve: The aortic valve is normal in structure. Aortic valve regurgitation is not visualized. No aortic stenosis is present. Pulmonic Valve: The pulmonic valve was normal in structure. Pulmonic valve regurgitation is not visualized. No evidence of pulmonic stenosis. Aorta: The aortic root is normal in size and structure. Venous: The inferior vena cava is normal in size with greater than 50% respiratory variability, suggesting right atrial pressure of 3 mmHg. IAS/Shunts: No atrial level shunt detected by color flow Doppler.  LEFT VENTRICLE PLAX 2D LVIDd:         4.20 cm LVIDs:         1.80 cm LV PW:         1.20 cm LV IVS:        1.20 cm LVOT diam:     1.90 cm LV SV:         78 LV SV Index:   45 LVOT Area:     2.84 cm  RIGHT VENTRICLE RV S prime:     17.70 cm/s TAPSE (M-mode): 2.5 cm LEFT ATRIUM             Index        RIGHT ATRIUM           Index LA Vol (A2C):   22.3 ml 12.88 ml/m  RA Area:     15.20 cm LA Vol (A4C):   32.8 ml 18.95 ml/m  RA Volume:   32.00 ml  18.49 ml/m LA Biplane Vol: 29.3 ml 16.93 ml/m  AORTIC VALVE LVOT Vmax:   143.00 cm/s LVOT Vmean:  100.000 cm/s LVOT VTI:    0.276 m  AORTA Ao Root diam: 3.20 cm Ao Asc diam:  3.20 cm  SHUNTS Systemic VTI:  0.28 m Systemic Diam: 1.90 cm Chilton Si MD Electronically signed by Chilton Si MD Signature Date/Time: 02/07/2023/5:23:03 PM    Final    VAS [place] LOWER EXTREMITY VENOUS (DVT)  Result Date: 02/07/2023  Lower Venous DVT Study Patient Name:  [name]  Date of Exam:   02/07/2023 Medical Rec #: [id number]      Accession #:    [id number] Date of Birth: Aug 02, 1970      Patient Gender: F Patient Age:   72 years Exam Location:  [facility] Procedure:      VAS [place] LOWER EXTREMITY VENOUS (DVT) Referring Phys: DAWOOD ELGERGAWY  --------------------------------------------------------------------------------  Indications: Swelling.  Risk Factors: None identified. Comparison Study: No prior studies. Performing Technologist: Chanda Busing RVT  Examination Guidelines: A complete evaluation includes B-mode imaging, spectral Doppler, color Doppler, and power Doppler as needed of all accessible portions of each vessel. Bilateral testing is considered  left ventricle has no regional wall motion abnormalities. The left ventricular internal cavity size was normal in size. There is mild concentric left ventricular hypertrophy. Left ventricular diastolic parameters are indeterminate. Right Ventricle: The right ventricular size is normal. No increase in right ventricular wall thickness. Right ventricular systolic function is normal. Left Atrium: Left atrial size was normal in size. Right Atrium: Right atrial size was normal in size. Pericardium: Pericardial effusion measuring up to 0.9 cm. A moderately sized pericardial effusion is present. There is diastolic collapse of the right ventricular free wall. There is no evidence of cardiac tamponade. Mitral Valve: The mitral valve is normal in structure. No evidence of mitral valve regurgitation. No evidence of  mitral valve stenosis. Tricuspid Valve: The tricuspid valve is normal in structure. Tricuspid valve regurgitation is not demonstrated. No evidence of tricuspid stenosis. Aortic Valve: The aortic valve is normal in structure. Aortic valve regurgitation is not visualized. No aortic stenosis is present. Pulmonic Valve: The pulmonic valve was normal in structure. Pulmonic valve regurgitation is not visualized. No evidence of pulmonic stenosis. Aorta: The aortic root is normal in size and structure. Venous: The inferior vena cava is normal in size with greater than 50% respiratory variability, suggesting right atrial pressure of 3 mmHg. IAS/Shunts: No atrial level shunt detected by color flow Doppler.  LEFT VENTRICLE PLAX 2D LVIDd:         4.20 cm LVIDs:         1.80 cm LV PW:         1.20 cm LV IVS:        1.20 cm LVOT diam:     1.90 cm LV SV:         78 LV SV Index:   45 LVOT Area:     2.84 cm  RIGHT VENTRICLE RV S prime:     17.70 cm/s TAPSE (M-mode): 2.5 cm LEFT ATRIUM             Index        RIGHT ATRIUM           Index LA Vol (A2C):   22.3 ml 12.88 ml/m  RA Area:     15.20 cm LA Vol (A4C):   32.8 ml 18.95 ml/m  RA Volume:   32.00 ml  18.49 ml/m LA Biplane Vol: 29.3 ml 16.93 ml/m  AORTIC VALVE LVOT Vmax:   143.00 cm/s LVOT Vmean:  100.000 cm/s LVOT VTI:    0.276 m  AORTA Ao Root diam: 3.20 cm Ao Asc diam:  3.20 cm  SHUNTS Systemic VTI:  0.28 m Systemic Diam: 1.90 cm Chilton Si MD Electronically signed by Chilton Si MD Signature Date/Time: 02/07/2023/5:23:03 PM    Final    VAS [place] LOWER EXTREMITY VENOUS (DVT)  Result Date: 02/07/2023  Lower Venous DVT Study Patient Name:  [name]  Date of Exam:   02/07/2023 Medical Rec #: [id number]      Accession #:    [id number] Date of Birth: Aug 02, 1970      Patient Gender: F Patient Age:   72 years Exam Location:  [facility] Procedure:      VAS [place] LOWER EXTREMITY VENOUS (DVT) Referring Phys: DAWOOD ELGERGAWY  --------------------------------------------------------------------------------  Indications: Swelling.  Risk Factors: None identified. Comparison Study: No prior studies. Performing Technologist: Chanda Busing RVT  Examination Guidelines: A complete evaluation includes B-mode imaging, spectral Doppler, color Doppler, and power Doppler as needed of all accessible portions of each vessel. Bilateral testing is considered  REDUCTION: This exam was performed according to the departmental dose-optimization program which includes automated exposure control, adjustment of the mA and/or kV according to patient size and/or use of iterative reconstruction technique. CONTRAST:  75mL OMNIPAQUE IOHEXOL 350  MG/ML SOLN COMPARISON:  None Available. FINDINGS: Cardiovascular: No significant coronary artery calcification. Global cardiac size is within normal limits. Small pericardial effusion. Central pulmonary arteries are enlarged in keeping with changes of pulmonary arterial hypertension. No intraluminal filling defect identified through the segmental level to suggest acute pulmonary embolism. The thoracic aorta is unremarkable. Mediastinum/Nodes: Visualized thyroid is unremarkable. No pathologic thoracic adenopathy. Nasoenteric feeding tube extends into the upper abdomen beyond the margin examination. Esophagus unremarkable. Lungs/Pleura: Small right pleural effusion. Mild bibasilar atelectasis. Lungs are otherwise clear. No pneumothorax. No central obstructing lesion. Upper Abdomen: No acute abnormality. Musculoskeletal: No chest wall abnormality. No acute or significant osseous findings. Review of the MIP images confirms the above findings. IMPRESSION: 1. No pulmonary embolism. 2. Small right pleural effusion. 3. Small pericardial effusion. 4. Enlarged central pulmonary arteries in keeping with changes of pulmonary arterial hypertension. Electronically Signed   By: Helyn Numbers M.D.   On: 02/08/2023 00:27   ECHOCARDIOGRAM COMPLETE  Result Date: 02/07/2023    ECHOCARDIOGRAM REPORT   Patient Name:   [name] Date of Exam: 02/07/2023 Medical Rec #:  [id number]     Height:       63.0 in Accession #:    [id number]    Weight:       154.1 lb Date of Birth:  [birth year]     BSA:          1.731 m Patient Age:    51 years      BP:           102/62 mmHg Patient Gender: F             HR:           135 bpm. Exam Location:  Inpatient Procedure: 2D Echo, Color Doppler and Cardiac Doppler Indications:    Sinus Tachycardia  History:        Patient has prior history of Echocardiogram examinations, most                 recent 03/10/2022. Infectious Colitis and COPD,                 Arrythmias:Cardiac Arrest and Tachycardia; Risk  Factors:Current                 Smoker.  Sonographer:    Milbert Coulter Referring Phys: 13 DAWOOD S ELGERGAWY IMPRESSIONS  1. Left ventricular ejection fraction, by estimation, is >75%. The left ventricle has hyperdynamic function. The left ventricle has no regional wall motion abnormalities. There is mild concentric left ventricular hypertrophy. Left ventricular diastolic parameters are indeterminate.  2. Right ventricular systolic function is normal. The right ventricular size is normal.  3. Pericardial effusion measuring up to 0.9 cm. Moderate pericardial effusion. There is no evidence of cardiac tamponade.  4. The mitral valve is normal in structure. No evidence of mitral valve regurgitation. No evidence of mitral stenosis.  5. The aortic valve is normal in structure. Aortic valve regurgitation is not visualized. No aortic stenosis is present.  6. The inferior vena cava is normal in size with greater than 50% respiratory variability, suggesting right atrial pressure of 3 mmHg. FINDINGS  Left Ventricle: Left ventricular ejection fraction, by estimation, is >75%. The left ventricle has hyperdynamic function. The

## 2023-02-09 NOTE — TOC Progression Note (Signed)
Transition of Care [facility]) - Progression Note    Patient Details  Name: [name] MRN: [id number] Date of Birth: 08-08-1970  Transition of [facility]) CM/SW Contact  Mearl Latin, LCSW Phone Number: 02/09/2023, 2:33 PM  Clinical Narrative:    CSW continuing to follow. Cortrak still in place.    Expected Discharge Plan: Skilled Nursing Facility Barriers to Discharge: Continued Medical Work up, SNF Pending bed offer, Insurance Authorization  Expected Discharge Plan and Services In-house Referral: Clinical Social Work                                             Social Determinants of Health (SDOH) Interventions SDOH Screenings   Food Insecurity: Food Insecurity Present (10/18/2022)   Received from [facility]  Housing: Low Risk  (01/16/2022)  Transportation Needs: Unmet Transportation Needs (10/18/2022)   Received from [facility]  Utilities: Not At Risk (10/18/2022)   Received from [facility]  Financial Resource Strain: Medium Risk (10/18/2022)   Received from [facility]  Social Connections: Unknown (09/01/2021)   Received from [facility], [facility]  Stress: Stress Concern Present (06/25/2020)   Received from [facility], [facility]  Tobacco Use: High Risk (02/02/2023)    Readmission Risk Interventions     No data to display

## 2023-02-09 NOTE — Progress Notes (Signed)
Physical Therapy Treatment Patient Details Name: [name] MRN: [id number] DOB: [birth year] Today's Date: 02/09/2023   History of Present Illness 52 yo female admitted 9/26 after found down for an unspecified amount of time with possible GIB, agonal breathing and pulseless with CPR 5 min. Intubated in [facility] ED and transferred to [facility]. Pt with AKI, elevated lactate, septic shock. 10/2 extubated and EGD. PMhx: COPD, chron's, HTN, DM, PTSD, seizure disorder, chronic back pain    PT Comments  Patient continues with decr cognition (slow response, requires repetition of cues), however was able to progress to short distance ambulation (12 ft) with RW and min assist. Limited by fatigue and HR elevating to 150 bpm. Noted cardiology note re: history of sinus tachycardia and anticipated incr with illness. Will continue efforts. Per chart pt lives alone and currently unable to care for herself.     If plan is discharge home, recommend the following: A lot of help with bathing/dressing/bathroom;Two people to help with walking and/or transfers;Assistance with cooking/housework;Direct supervision/assist for medications management;Assist for transportation;Supervision due to cognitive status;Direct supervision/assist for financial management;Help with stairs or ramp for entrance   Can travel by private vehicle     No  Equipment Recommendations  Rolling walker (2 wheels);BSC/3in1;Wheelchair (measurements PT);Wheelchair cushion (measurements PT)    Recommendations for Other Services       Precautions / Restrictions Precautions Precautions: Fall;Other (comment) Precaution Comments: watch HR and SPO2, flexiseal Restrictions Weight Bearing Restrictions: No     Mobility  Bed Mobility Overal bed mobility: Needs Assistance Bed Mobility: Rolling, Sidelying to Sit Rolling: Used rails, Contact guard assist Sidelying to sit: Used rails, HOB elevated, Contact guard assist       General bed mobility  comments: pt requires cues for all steps of the process however no physical assist this date    Transfers Overall transfer level: Needs assistance Equipment used: Rolling walker (2 wheels) Transfers: Sit to/from Stand Sit to Stand: Min assist           General transfer comment: pt doing better and second person for lines/safety only; pt required help directing/steering RW and cues for how to go sideways through a narrow spot in room    Ambulation/Gait Ambulation/Gait assistance: Min assist, +2 safety/equipment Gait Distance (Feet): 12 Feet Assistive device: Rolling walker (2 wheels) Gait Pattern/deviations: Step-through pattern, Decreased stride length, Trunk flexed       General Gait Details: initially dizzy on standing with BP stable; did not feel she could walk to the door and instead walked around bed to chair; pt very fatigued once seated   Stairs             Wheelchair Mobility     Tilt Bed    Modified Rankin (Stroke Patients Only)       Balance Overall balance assessment: Needs assistance Sitting-balance support: Feet supported, Bilateral upper extremity supported Sitting balance-Leahy Scale: Fair Sitting balance - Comments: EOB with guarding   Standing balance support: Bilateral upper extremity supported, Reliant on assistive device for balance, During functional activity Standing balance-Leahy Scale: Poor Standing balance comment: UB support on RW in standing                            Cognition Arousal: Alert Behavior During Therapy: Flat affect Overall Cognitive Status: Impaired/Different from baseline Area of Impairment: Attention, Memory, Following commands, Safety/judgement  Current Attention Level: Sustained Memory: Decreased short-term memory Following Commands: Follows one step commands inconsistently, Follows one step commands with increased time Safety/Judgement: Decreased awareness of deficits,  Decreased awareness of safety     General Comments: slow to follow commands and requiring repetition at times; orientation NT        Exercises      General Comments General comments (skin integrity, edema, etc.): resting HR 128; max HR 150; MD aware and reports history of tachycardia and expected due to illness      Pertinent Vitals/Pain Pain Assessment Pain Assessment: Faces Faces Pain Scale: Hurts little more Pain Location: abdomen Pain Descriptors / Indicators: Guarding, Tender Pain Intervention(s): Limited activity within patient's tolerance, Monitored during session    Home Living                          Prior Function            PT Goals (current goals can now be found in the care plan section) Acute Rehab PT Goals Patient Stated Goal: go home Time For Goal Achievement: 02/17/23 Potential to Achieve Goals: Good Progress towards PT goals: Progressing toward goals    Frequency    Min 1X/week      PT Plan      Co-evaluation              AM-PAC PT "6 Clicks" Mobility   Outcome Measure  Help needed turning from your back to your side while in a flat bed without using bedrails?: A Little Help needed moving from lying on your back to sitting on the side of a flat bed without using bedrails?: A Little Help needed moving to and from a bed to a chair (including a wheelchair)?: A Little Help needed standing up from a chair using your arms (e.g., wheelchair or bedside chair)?: A Little Help needed to walk in hospital room?: A Lot (<20 ft) Help needed climbing 3-5 steps with a railing? : Total 6 Click Score: 15    End of Session Equipment Utilized During Treatment: Gait belt Activity Tolerance: Patient limited by fatigue Patient left: in chair;with call bell/phone within reach;with chair alarm set Nurse Communication: Mobility status;Other (comment) (?flow of rectal pouch when seated and with purewick) PT Visit Diagnosis: Other abnormalities  of gait and mobility (R26.89);Muscle weakness (generalized) (M62.81)     Time: 5621-3086 PT Time Calculation (min) (ACUTE ONLY): 28 min  Charges:    $Gait Training: 8-22 mins $Therapeutic Activity: 8-22 mins PT General Charges $$ ACUTE PT VISIT: 1 Visit                      Jerolyn Center, PT Acute Rehabilitation Services  Office [phone]    [name] 02/09/2023, 11:24 AM

## 2023-02-09 NOTE — Progress Notes (Signed)
cm LEFT ATRIUM             Index        RIGHT ATRIUM           Index LA Vol (A2C):   22.3 ml 12.88 ml/m  RA Area:     15.20 cm LA Vol (A4C):   32.8 ml 18.95 ml/m  RA Volume:   32.00 ml  18.49 ml/m LA Biplane Vol: 29.3 ml 16.93 ml/m  AORTIC VALVE LVOT Vmax:   143.00 cm/s LVOT Vmean:  100.000 cm/s LVOT VTI:    0.276 m  AORTA Ao Root diam: 3.20 cm Ao Asc diam:  3.20 cm  SHUNTS Systemic VTI:  0.28 m Systemic Diam: 1.90 cm Chilton Si MD Electronically signed by Chilton Si  MD Signature Date/Time: 02/07/2023/5:23:03 PM    Final    VAS [place] LOWER EXTREMITY VENOUS (DVT)  Result Date: 02/07/2023  Lower Venous DVT Study Patient Name:  [name]  Date of Exam:   02/07/2023 Medical Rec #: [id number]      Accession #:    [id number] Date of Birth: [birth year]      Patient Gender: F Patient Age:   63 years Exam Location:  [facility] Procedure:      VAS [place] LOWER EXTREMITY VENOUS (DVT) Referring Phys: DAWOOD ELGERGAWY --------------------------------------------------------------------------------  Indications: Swelling.  Risk Factors: None identified. Comparison Study: No prior studies. Performing Technologist: Chanda Busing RVT  Examination Guidelines: A complete evaluation includes B-mode imaging, spectral Doppler, color Doppler, and power Doppler as needed of all accessible portions of each vessel. Bilateral testing is considered an integral part of a complete examination. Limited examinations for reoccurring indications may be performed as noted. The reflux portion of the exam is performed with the patient in reverse Trendelenburg.  +---------+---------------+---------+-----------+----------+--------------+ RIGHT    CompressibilityPhasicitySpontaneityPropertiesThrombus Aging +---------+---------------+---------+-----------+----------+--------------+ CFV      Full           Yes      Yes                                 +---------+---------------+---------+-----------+----------+--------------+ SFJ      Full                                                        +---------+---------------+---------+-----------+----------+--------------+ FV Prox  Full                                                        +---------+---------------+---------+-----------+----------+--------------+ FV Mid   Full                                                        +---------+---------------+---------+-----------+----------+--------------+ FV DistalFull                                                         +---------+---------------+---------+-----------+----------+--------------+  PROGRESS NOTE        PATIENT DETAILS Name: [name] Age: 52 y.o. Sex: female Date of Birth: 07-Apr-1971 Admit Date: 01/27/2023 Admitting Physician Lynnell Catalan, MD UJW:JXBJYN, Marcelino Duster, PA-C  Brief Summary: Patient is a 52 y.o.  female with history of Crohn's disease-on Skyrizi, HTN,DM-2, COPD-who had diarrhea at home-refused to seek medical attention-she was found unresponsive by family-EMS was called-she was found to be lying in a large pool of bloody emesis +/- diarrhea-SBP in the 60s-minimally responsive.  She was brought to the ED-where she was unresponsive with agonal breathing and pulseless-CPR was initiated for approximately 5 minutes-with ROSC.  She was intubated-and admitted to the ICU.  Upon further evaluation with a flexible sigmoidoscopy-she was found to have changes consistent with pseudomembranous colitis.  See below for further details.  Significant events: 9/26>> admit to ICU-intubated/pressors 9/29>> off pressors 10/2>> extubated 10/4>> transferred to [facility]  Significant studies: 9/26>> CXR: No pneumonia 9/26>> x-ray abdomen: Mildly dilated small bowel loops 9/26>> CT head: No acute abnormalities 9/26>> CT abdomen/pelvis: Infectious or inflammatory proctocolitis 9/30>> CT abdomen/pelvis: Fused colitis/proctitis-small bowel enteritis 10/7>> CTA chest: No PE 10/7>> bilateral lower extremity Doppler: No DVT 10/7>> echo: EF> 75% 10/8>> x-ray abdomen: Mild to moderate bowel distention suggestive of ileus.  Significant microbiology data: 9/26>> COVID/influenza/RSV PCR: Negative 9/26>> blood: Staph hominis (contamination) 9/29>> stool GI pathogen panel: Negative 9/29>> stool C. difficile: Antigen +ve-toxin -ve  Procedures: 10/2>> flexible sigmoidoscopy: Severe mucosal changes in the rectum-consistent with Crohn's disease-and as well as pseudomembranes-consistent with C. difficile colitis  Consults: None  Subjective: Lying comfortably  in bed-denies any chest pain or shortness of breath.  Had around 3-4 loose stools overnight.  Denies excessive abdominal distention.  Objective: Vitals: Blood pressure (!) 128/58, pulse (!) 143, temperature 98.4 F (36.9 C), temperature source Oral, resp. rate 20, height 5' 2.99" (1.6 m), weight 66.3 kg, SpO2 100%.   Exam: Gen Exam:Alert awake-not in any distress HEENT:atraumatic, normocephalic Chest: B/L clear to auscultation anteriorly CVS:S1S2 regular Abdomen:soft non tender, non distended Extremities:no edema Neurology: Non focal Skin: no rash  Pertinent Labs/Radiology:    Latest Ref Rng & Units 02/09/2023    4:30 AM 02/08/2023    7:27 AM 02/08/2023    3:52 AM  CBC  WBC 4.0 - 10.5 K/uL 12.6  12.6  13.1   Hemoglobin 12.0 - 15.0 g/dL 82.9  56.2  13.0   Hematocrit 36.0 - 46.0 % 32.5  34.6  32.5   Platelets 150 - 400 K/uL 1,050  806  1,031     Lab Results  Component Value Date   NA 131 (L) 02/09/2023   K 4.7 02/09/2023   CL 100 02/09/2023   CO2 20 (L) 02/09/2023      Assessment/Plan: Septic shock secondary to fulminant Clostridium difficile infection Sepsis physiology improved Did require pressors on initial presentation Cultures negative  Cardiac arrest Due to septic shock This was on initial presentation-briefly required CPR Currently completely awake/alert Echo stable CT angiogram chest-no PE Telemetry without any arrhythmias  Acute metabolic encephalopathy Secondary septic shock Mentation improved-awake/alert this morning.  Fulminant C. difficile colitis with ileus Some diarrhea overnight Awaiting abdominal x-ray this morning Keep K> 4, Mg> 2 On oral vancomycin-dosage escalated on 10/8 GI following-await further recommendations.  History of Crohn's disease Recently started on Skyrizi-second dose on 9/13  Sinus tachycardia Felt to be  01/27/2023 0514   LABSPEC 1.020 01/27/2023 0514   PHURINE 5.0 01/27/2023 0514   GLUCOSEU NEGATIVE 01/27/2023 0514   GLUCOSEU NEG mg/dL 40/98/1191 4782   HGBUR NEGATIVE 01/27/2023 0514   BILIRUBINUR NEGATIVE 01/27/2023 0514   KETONESUR 5 (A) 01/27/2023 0514   PROTEINUR 30 (A) 01/27/2023 0514   UROBILINOGEN 0.2 12/26/2013 1530   NITRITE NEGATIVE 01/27/2023 0514   LEUKOCYTESUR TRACE (A) 01/27/2023 0514    Sepsis Labs: Lactic Acid, Venous    Component Value Date/Time   LATICACIDVEN 1.0 01/29/2023 0816    MICROBIOLOGY: No results found for this or any previous visit (from the past 240 hour(s)).  RADIOLOGY STUDIES/RESULTS: DG Abd 2 Views  Result Date: 02/08/2023 CLINICAL DATA:  Ileus.  Acute generalized abdominal pain. EXAM: ABDOMEN - 2 VIEW COMPARISON:  Abdominal radiograph 01/31/2023 FINDINGS: A feeding tube remains in place with tip in the region of the distal stomach or proximal duodenum. No intraperitoneal free air is identified. There is moderate gaseous distension of colon and likely mild distension of some small bowel loops. Right upper quadrant abdominal surgical clips are noted. No acute osseous abnormality is seen. IMPRESSION: Mild-to-moderate bowel distension suggestive of ileus. Electronically Signed   By: Sebastian Ache M.D.   On: 02/08/2023 12:42   CT Angio Chest Pulmonary Embolism (PE) W or WO Contrast  Result Date: 02/08/2023 CLINICAL DATA:  High probability pulmonary embolism, sinus tachycardia, elevated D-dimer EXAM: CT ANGIOGRAPHY CHEST WITH CONTRAST TECHNIQUE: Multidetector CT imaging of the  chest was performed using the standard protocol during bolus administration of intravenous contrast. Multiplanar CT image reconstructions and MIPs were obtained to evaluate the vascular anatomy. RADIATION DOSE REDUCTION: This exam was performed according to the departmental dose-optimization program which includes automated exposure control, adjustment of the mA and/or kV according to patient size and/or use of iterative reconstruction technique. CONTRAST:  75mL OMNIPAQUE IOHEXOL 350 MG/ML SOLN COMPARISON:  None Available. FINDINGS: Cardiovascular: No significant coronary artery calcification. Global cardiac size is within normal limits. Small pericardial effusion. Central pulmonary arteries are enlarged in keeping with changes of pulmonary arterial hypertension. No intraluminal filling defect identified through the segmental level to suggest acute pulmonary embolism. The thoracic aorta is unremarkable. Mediastinum/Nodes: Visualized thyroid is unremarkable. No pathologic thoracic adenopathy. Nasoenteric feeding tube extends into the upper abdomen beyond the margin examination. Esophagus unremarkable. Lungs/Pleura: Small right pleural effusion. Mild bibasilar atelectasis. Lungs are otherwise clear. No pneumothorax. No central obstructing lesion. Upper Abdomen: No acute abnormality. Musculoskeletal: No chest wall abnormality. No acute or significant osseous findings. Review of the MIP images confirms the above findings. IMPRESSION: 1. No pulmonary embolism. 2. Small right pleural effusion. 3. Small pericardial effusion. 4. Enlarged central pulmonary arteries in keeping with changes of pulmonary arterial hypertension. Electronically Signed   By: Helyn Numbers M.D.   On: 02/08/2023 00:27   ECHOCARDIOGRAM COMPLETE  Result Date: 02/07/2023    ECHOCARDIOGRAM REPORT   Patient Name:   [name] Date of Exam: 02/07/2023 Medical Rec #:  [id number]     Height:       63.0 in Accession #:    [id number]    Weight:        154.1 lb Date of Birth:  31-May-1970     BSA:          1.731 m Patient Age:    51 years      BP:           102/62 mmHg Patient Gender: F  PROGRESS NOTE        PATIENT DETAILS Name: [name] Age: 52 y.o. Sex: female Date of Birth: 07-Apr-1971 Admit Date: 01/27/2023 Admitting Physician Lynnell Catalan, MD UJW:JXBJYN, Marcelino Duster, PA-C  Brief Summary: Patient is a 52 y.o.  female with history of Crohn's disease-on Skyrizi, HTN,DM-2, COPD-who had diarrhea at home-refused to seek medical attention-she was found unresponsive by family-EMS was called-she was found to be lying in a large pool of bloody emesis +/- diarrhea-SBP in the 60s-minimally responsive.  She was brought to the ED-where she was unresponsive with agonal breathing and pulseless-CPR was initiated for approximately 5 minutes-with ROSC.  She was intubated-and admitted to the ICU.  Upon further evaluation with a flexible sigmoidoscopy-she was found to have changes consistent with pseudomembranous colitis.  See below for further details.  Significant events: 9/26>> admit to ICU-intubated/pressors 9/29>> off pressors 10/2>> extubated 10/4>> transferred to [facility]  Significant studies: 9/26>> CXR: No pneumonia 9/26>> x-ray abdomen: Mildly dilated small bowel loops 9/26>> CT head: No acute abnormalities 9/26>> CT abdomen/pelvis: Infectious or inflammatory proctocolitis 9/30>> CT abdomen/pelvis: Fused colitis/proctitis-small bowel enteritis 10/7>> CTA chest: No PE 10/7>> bilateral lower extremity Doppler: No DVT 10/7>> echo: EF> 75% 10/8>> x-ray abdomen: Mild to moderate bowel distention suggestive of ileus.  Significant microbiology data: 9/26>> COVID/influenza/RSV PCR: Negative 9/26>> blood: Staph hominis (contamination) 9/29>> stool GI pathogen panel: Negative 9/29>> stool C. difficile: Antigen +ve-toxin -ve  Procedures: 10/2>> flexible sigmoidoscopy: Severe mucosal changes in the rectum-consistent with Crohn's disease-and as well as pseudomembranes-consistent with C. difficile colitis  Consults: None  Subjective: Lying comfortably  in bed-denies any chest pain or shortness of breath.  Had around 3-4 loose stools overnight.  Denies excessive abdominal distention.  Objective: Vitals: Blood pressure (!) 128/58, pulse (!) 143, temperature 98.4 F (36.9 C), temperature source Oral, resp. rate 20, height 5' 2.99" (1.6 m), weight 66.3 kg, SpO2 100%.   Exam: Gen Exam:Alert awake-not in any distress HEENT:atraumatic, normocephalic Chest: B/L clear to auscultation anteriorly CVS:S1S2 regular Abdomen:soft non tender, non distended Extremities:no edema Neurology: Non focal Skin: no rash  Pertinent Labs/Radiology:    Latest Ref Rng & Units 02/09/2023    4:30 AM 02/08/2023    7:27 AM 02/08/2023    3:52 AM  CBC  WBC 4.0 - 10.5 K/uL 12.6  12.6  13.1   Hemoglobin 12.0 - 15.0 g/dL 82.9  56.2  13.0   Hematocrit 36.0 - 46.0 % 32.5  34.6  32.5   Platelets 150 - 400 K/uL 1,050  806  1,031     Lab Results  Component Value Date   NA 131 (L) 02/09/2023   K 4.7 02/09/2023   CL 100 02/09/2023   CO2 20 (L) 02/09/2023      Assessment/Plan: Septic shock secondary to fulminant Clostridium difficile infection Sepsis physiology improved Did require pressors on initial presentation Cultures negative  Cardiac arrest Due to septic shock This was on initial presentation-briefly required CPR Currently completely awake/alert Echo stable CT angiogram chest-no PE Telemetry without any arrhythmias  Acute metabolic encephalopathy Secondary septic shock Mentation improved-awake/alert this morning.  Fulminant C. difficile colitis with ileus Some diarrhea overnight Awaiting abdominal x-ray this morning Keep K> 4, Mg> 2 On oral vancomycin-dosage escalated on 10/8 GI following-await further recommendations.  History of Crohn's disease Recently started on Skyrizi-second dose on 9/13  Sinus tachycardia Felt to be  01/27/2023 0514   LABSPEC 1.020 01/27/2023 0514   PHURINE 5.0 01/27/2023 0514   GLUCOSEU NEGATIVE 01/27/2023 0514   GLUCOSEU NEG mg/dL 40/98/1191 4782   HGBUR NEGATIVE 01/27/2023 0514   BILIRUBINUR NEGATIVE 01/27/2023 0514   KETONESUR 5 (A) 01/27/2023 0514   PROTEINUR 30 (A) 01/27/2023 0514   UROBILINOGEN 0.2 12/26/2013 1530   NITRITE NEGATIVE 01/27/2023 0514   LEUKOCYTESUR TRACE (A) 01/27/2023 0514    Sepsis Labs: Lactic Acid, Venous    Component Value Date/Time   LATICACIDVEN 1.0 01/29/2023 0816    MICROBIOLOGY: No results found for this or any previous visit (from the past 240 hour(s)).  RADIOLOGY STUDIES/RESULTS: DG Abd 2 Views  Result Date: 02/08/2023 CLINICAL DATA:  Ileus.  Acute generalized abdominal pain. EXAM: ABDOMEN - 2 VIEW COMPARISON:  Abdominal radiograph 01/31/2023 FINDINGS: A feeding tube remains in place with tip in the region of the distal stomach or proximal duodenum. No intraperitoneal free air is identified. There is moderate gaseous distension of colon and likely mild distension of some small bowel loops. Right upper quadrant abdominal surgical clips are noted. No acute osseous abnormality is seen. IMPRESSION: Mild-to-moderate bowel distension suggestive of ileus. Electronically Signed   By: Sebastian Ache M.D.   On: 02/08/2023 12:42   CT Angio Chest Pulmonary Embolism (PE) W or WO Contrast  Result Date: 02/08/2023 CLINICAL DATA:  High probability pulmonary embolism, sinus tachycardia, elevated D-dimer EXAM: CT ANGIOGRAPHY CHEST WITH CONTRAST TECHNIQUE: Multidetector CT imaging of the  chest was performed using the standard protocol during bolus administration of intravenous contrast. Multiplanar CT image reconstructions and MIPs were obtained to evaluate the vascular anatomy. RADIATION DOSE REDUCTION: This exam was performed according to the departmental dose-optimization program which includes automated exposure control, adjustment of the mA and/or kV according to patient size and/or use of iterative reconstruction technique. CONTRAST:  75mL OMNIPAQUE IOHEXOL 350 MG/ML SOLN COMPARISON:  None Available. FINDINGS: Cardiovascular: No significant coronary artery calcification. Global cardiac size is within normal limits. Small pericardial effusion. Central pulmonary arteries are enlarged in keeping with changes of pulmonary arterial hypertension. No intraluminal filling defect identified through the segmental level to suggest acute pulmonary embolism. The thoracic aorta is unremarkable. Mediastinum/Nodes: Visualized thyroid is unremarkable. No pathologic thoracic adenopathy. Nasoenteric feeding tube extends into the upper abdomen beyond the margin examination. Esophagus unremarkable. Lungs/Pleura: Small right pleural effusion. Mild bibasilar atelectasis. Lungs are otherwise clear. No pneumothorax. No central obstructing lesion. Upper Abdomen: No acute abnormality. Musculoskeletal: No chest wall abnormality. No acute or significant osseous findings. Review of the MIP images confirms the above findings. IMPRESSION: 1. No pulmonary embolism. 2. Small right pleural effusion. 3. Small pericardial effusion. 4. Enlarged central pulmonary arteries in keeping with changes of pulmonary arterial hypertension. Electronically Signed   By: Helyn Numbers M.D.   On: 02/08/2023 00:27   ECHOCARDIOGRAM COMPLETE  Result Date: 02/07/2023    ECHOCARDIOGRAM REPORT   Patient Name:   [name] Date of Exam: 02/07/2023 Medical Rec #:  [id number]     Height:       63.0 in Accession #:    [id number]    Weight:        154.1 lb Date of Birth:  31-May-1970     BSA:          1.731 m Patient Age:    51 years      BP:           102/62 mmHg Patient Gender: F  PROGRESS NOTE        PATIENT DETAILS Name: [name] Age: 52 y.o. Sex: female Date of Birth: 07-Apr-1971 Admit Date: 01/27/2023 Admitting Physician Lynnell Catalan, MD UJW:JXBJYN, Marcelino Duster, PA-C  Brief Summary: Patient is a 52 y.o.  female with history of Crohn's disease-on Skyrizi, HTN,DM-2, COPD-who had diarrhea at home-refused to seek medical attention-she was found unresponsive by family-EMS was called-she was found to be lying in a large pool of bloody emesis +/- diarrhea-SBP in the 60s-minimally responsive.  She was brought to the ED-where she was unresponsive with agonal breathing and pulseless-CPR was initiated for approximately 5 minutes-with ROSC.  She was intubated-and admitted to the ICU.  Upon further evaluation with a flexible sigmoidoscopy-she was found to have changes consistent with pseudomembranous colitis.  See below for further details.  Significant events: 9/26>> admit to ICU-intubated/pressors 9/29>> off pressors 10/2>> extubated 10/4>> transferred to [facility]  Significant studies: 9/26>> CXR: No pneumonia 9/26>> x-ray abdomen: Mildly dilated small bowel loops 9/26>> CT head: No acute abnormalities 9/26>> CT abdomen/pelvis: Infectious or inflammatory proctocolitis 9/30>> CT abdomen/pelvis: Fused colitis/proctitis-small bowel enteritis 10/7>> CTA chest: No PE 10/7>> bilateral lower extremity Doppler: No DVT 10/7>> echo: EF> 75% 10/8>> x-ray abdomen: Mild to moderate bowel distention suggestive of ileus.  Significant microbiology data: 9/26>> COVID/influenza/RSV PCR: Negative 9/26>> blood: Staph hominis (contamination) 9/29>> stool GI pathogen panel: Negative 9/29>> stool C. difficile: Antigen +ve-toxin -ve  Procedures: 10/2>> flexible sigmoidoscopy: Severe mucosal changes in the rectum-consistent with Crohn's disease-and as well as pseudomembranes-consistent with C. difficile colitis  Consults: None  Subjective: Lying comfortably  in bed-denies any chest pain or shortness of breath.  Had around 3-4 loose stools overnight.  Denies excessive abdominal distention.  Objective: Vitals: Blood pressure (!) 128/58, pulse (!) 143, temperature 98.4 F (36.9 C), temperature source Oral, resp. rate 20, height 5' 2.99" (1.6 m), weight 66.3 kg, SpO2 100%.   Exam: Gen Exam:Alert awake-not in any distress HEENT:atraumatic, normocephalic Chest: B/L clear to auscultation anteriorly CVS:S1S2 regular Abdomen:soft non tender, non distended Extremities:no edema Neurology: Non focal Skin: no rash  Pertinent Labs/Radiology:    Latest Ref Rng & Units 02/09/2023    4:30 AM 02/08/2023    7:27 AM 02/08/2023    3:52 AM  CBC  WBC 4.0 - 10.5 K/uL 12.6  12.6  13.1   Hemoglobin 12.0 - 15.0 g/dL 82.9  56.2  13.0   Hematocrit 36.0 - 46.0 % 32.5  34.6  32.5   Platelets 150 - 400 K/uL 1,050  806  1,031     Lab Results  Component Value Date   NA 131 (L) 02/09/2023   K 4.7 02/09/2023   CL 100 02/09/2023   CO2 20 (L) 02/09/2023      Assessment/Plan: Septic shock secondary to fulminant Clostridium difficile infection Sepsis physiology improved Did require pressors on initial presentation Cultures negative  Cardiac arrest Due to septic shock This was on initial presentation-briefly required CPR Currently completely awake/alert Echo stable CT angiogram chest-no PE Telemetry without any arrhythmias  Acute metabolic encephalopathy Secondary septic shock Mentation improved-awake/alert this morning.  Fulminant C. difficile colitis with ileus Some diarrhea overnight Awaiting abdominal x-ray this morning Keep K> 4, Mg> 2 On oral vancomycin-dosage escalated on 10/8 GI following-await further recommendations.  History of Crohn's disease Recently started on Skyrizi-second dose on 9/13  Sinus tachycardia Felt to be  PROGRESS NOTE        PATIENT DETAILS Name: [name] Age: 52 y.o. Sex: female Date of Birth: 07-Apr-1971 Admit Date: 01/27/2023 Admitting Physician Lynnell Catalan, MD UJW:JXBJYN, Marcelino Duster, PA-C  Brief Summary: Patient is a 52 y.o.  female with history of Crohn's disease-on Skyrizi, HTN,DM-2, COPD-who had diarrhea at home-refused to seek medical attention-she was found unresponsive by family-EMS was called-she was found to be lying in a large pool of bloody emesis +/- diarrhea-SBP in the 60s-minimally responsive.  She was brought to the ED-where she was unresponsive with agonal breathing and pulseless-CPR was initiated for approximately 5 minutes-with ROSC.  She was intubated-and admitted to the ICU.  Upon further evaluation with a flexible sigmoidoscopy-she was found to have changes consistent with pseudomembranous colitis.  See below for further details.  Significant events: 9/26>> admit to ICU-intubated/pressors 9/29>> off pressors 10/2>> extubated 10/4>> transferred to [facility]  Significant studies: 9/26>> CXR: No pneumonia 9/26>> x-ray abdomen: Mildly dilated small bowel loops 9/26>> CT head: No acute abnormalities 9/26>> CT abdomen/pelvis: Infectious or inflammatory proctocolitis 9/30>> CT abdomen/pelvis: Fused colitis/proctitis-small bowel enteritis 10/7>> CTA chest: No PE 10/7>> bilateral lower extremity Doppler: No DVT 10/7>> echo: EF> 75% 10/8>> x-ray abdomen: Mild to moderate bowel distention suggestive of ileus.  Significant microbiology data: 9/26>> COVID/influenza/RSV PCR: Negative 9/26>> blood: Staph hominis (contamination) 9/29>> stool GI pathogen panel: Negative 9/29>> stool C. difficile: Antigen +ve-toxin -ve  Procedures: 10/2>> flexible sigmoidoscopy: Severe mucosal changes in the rectum-consistent with Crohn's disease-and as well as pseudomembranes-consistent with C. difficile colitis  Consults: None  Subjective: Lying comfortably  in bed-denies any chest pain or shortness of breath.  Had around 3-4 loose stools overnight.  Denies excessive abdominal distention.  Objective: Vitals: Blood pressure (!) 128/58, pulse (!) 143, temperature 98.4 F (36.9 C), temperature source Oral, resp. rate 20, height 5' 2.99" (1.6 m), weight 66.3 kg, SpO2 100%.   Exam: Gen Exam:Alert awake-not in any distress HEENT:atraumatic, normocephalic Chest: B/L clear to auscultation anteriorly CVS:S1S2 regular Abdomen:soft non tender, non distended Extremities:no edema Neurology: Non focal Skin: no rash  Pertinent Labs/Radiology:    Latest Ref Rng & Units 02/09/2023    4:30 AM 02/08/2023    7:27 AM 02/08/2023    3:52 AM  CBC  WBC 4.0 - 10.5 K/uL 12.6  12.6  13.1   Hemoglobin 12.0 - 15.0 g/dL 82.9  56.2  13.0   Hematocrit 36.0 - 46.0 % 32.5  34.6  32.5   Platelets 150 - 400 K/uL 1,050  806  1,031     Lab Results  Component Value Date   NA 131 (L) 02/09/2023   K 4.7 02/09/2023   CL 100 02/09/2023   CO2 20 (L) 02/09/2023      Assessment/Plan: Septic shock secondary to fulminant Clostridium difficile infection Sepsis physiology improved Did require pressors on initial presentation Cultures negative  Cardiac arrest Due to septic shock This was on initial presentation-briefly required CPR Currently completely awake/alert Echo stable CT angiogram chest-no PE Telemetry without any arrhythmias  Acute metabolic encephalopathy Secondary septic shock Mentation improved-awake/alert this morning.  Fulminant C. difficile colitis with ileus Some diarrhea overnight Awaiting abdominal x-ray this morning Keep K> 4, Mg> 2 On oral vancomycin-dosage escalated on 10/8 GI following-await further recommendations.  History of Crohn's disease Recently started on Skyrizi-second dose on 9/13  Sinus tachycardia Felt to be  01/27/2023 0514   LABSPEC 1.020 01/27/2023 0514   PHURINE 5.0 01/27/2023 0514   GLUCOSEU NEGATIVE 01/27/2023 0514   GLUCOSEU NEG mg/dL 40/98/1191 4782   HGBUR NEGATIVE 01/27/2023 0514   BILIRUBINUR NEGATIVE 01/27/2023 0514   KETONESUR 5 (A) 01/27/2023 0514   PROTEINUR 30 (A) 01/27/2023 0514   UROBILINOGEN 0.2 12/26/2013 1530   NITRITE NEGATIVE 01/27/2023 0514   LEUKOCYTESUR TRACE (A) 01/27/2023 0514    Sepsis Labs: Lactic Acid, Venous    Component Value Date/Time   LATICACIDVEN 1.0 01/29/2023 0816    MICROBIOLOGY: No results found for this or any previous visit (from the past 240 hour(s)).  RADIOLOGY STUDIES/RESULTS: DG Abd 2 Views  Result Date: 02/08/2023 CLINICAL DATA:  Ileus.  Acute generalized abdominal pain. EXAM: ABDOMEN - 2 VIEW COMPARISON:  Abdominal radiograph 01/31/2023 FINDINGS: A feeding tube remains in place with tip in the region of the distal stomach or proximal duodenum. No intraperitoneal free air is identified. There is moderate gaseous distension of colon and likely mild distension of some small bowel loops. Right upper quadrant abdominal surgical clips are noted. No acute osseous abnormality is seen. IMPRESSION: Mild-to-moderate bowel distension suggestive of ileus. Electronically Signed   By: Sebastian Ache M.D.   On: 02/08/2023 12:42   CT Angio Chest Pulmonary Embolism (PE) W or WO Contrast  Result Date: 02/08/2023 CLINICAL DATA:  High probability pulmonary embolism, sinus tachycardia, elevated D-dimer EXAM: CT ANGIOGRAPHY CHEST WITH CONTRAST TECHNIQUE: Multidetector CT imaging of the  chest was performed using the standard protocol during bolus administration of intravenous contrast. Multiplanar CT image reconstructions and MIPs were obtained to evaluate the vascular anatomy. RADIATION DOSE REDUCTION: This exam was performed according to the departmental dose-optimization program which includes automated exposure control, adjustment of the mA and/or kV according to patient size and/or use of iterative reconstruction technique. CONTRAST:  75mL OMNIPAQUE IOHEXOL 350 MG/ML SOLN COMPARISON:  None Available. FINDINGS: Cardiovascular: No significant coronary artery calcification. Global cardiac size is within normal limits. Small pericardial effusion. Central pulmonary arteries are enlarged in keeping with changes of pulmonary arterial hypertension. No intraluminal filling defect identified through the segmental level to suggest acute pulmonary embolism. The thoracic aorta is unremarkable. Mediastinum/Nodes: Visualized thyroid is unremarkable. No pathologic thoracic adenopathy. Nasoenteric feeding tube extends into the upper abdomen beyond the margin examination. Esophagus unremarkable. Lungs/Pleura: Small right pleural effusion. Mild bibasilar atelectasis. Lungs are otherwise clear. No pneumothorax. No central obstructing lesion. Upper Abdomen: No acute abnormality. Musculoskeletal: No chest wall abnormality. No acute or significant osseous findings. Review of the MIP images confirms the above findings. IMPRESSION: 1. No pulmonary embolism. 2. Small right pleural effusion. 3. Small pericardial effusion. 4. Enlarged central pulmonary arteries in keeping with changes of pulmonary arterial hypertension. Electronically Signed   By: Helyn Numbers M.D.   On: 02/08/2023 00:27   ECHOCARDIOGRAM COMPLETE  Result Date: 02/07/2023    ECHOCARDIOGRAM REPORT   Patient Name:   [name] Date of Exam: 02/07/2023 Medical Rec #:  [id number]     Height:       63.0 in Accession #:    [id number]    Weight:        154.1 lb Date of Birth:  31-May-1970     BSA:          1.731 m Patient Age:    51 years      BP:           102/62 mmHg Patient Gender: F

## 2023-02-09 NOTE — Progress Notes (Signed)
Speech Language Pathology Treatment: Cognitive-Linquistic  Patient Details Name: [name] MRN: [id number] DOB: Dec 28, 1970 Today's Date: 02/09/2023 Time: 4098-1191 SLP Time Calculation (min) (ACUTE ONLY): 20 min  Assessment / Plan / Recommendation Clinical Impression  Treatment focused on cognition. Pt adequately awake but seemed somewhat drowsy today. She needed cues for situational orientation to recall initial reason admitted to hospital (cardiac arrest) but recalled her "stomach issues". She was not oriented to month, day or date but accurate given semantic cues. MD (GI) arrived and updated pt on her situation and plan. After he left she accurately recalled information he relayed to her but needed min reminders  She answered questions from mock menu including adding 3 digits numbers with 75% accuracy. At end of session with a 20 minute delay she was able to recall orientation with 75% for temporal and situational information. ST to continue for cognition and dysphagia  Pt not observed with po's today. Her diet was downgraded from Dys 2 to full liquids per GI.    HPI HPI: 52 yo female admitted 9/26 after found down for an unspecified amount of time with possible GIB, agonal breathing and pulseless with CPR 5 min. Intubated in [facility] ED and transferred to [facility]. Pt with AKI, elevated lactate, septic shock. 10/2 extubated and EGD. PMhx: COPD, chron's, HTN, DM, PTSD, seizure disorder, chronic back pain      SLP Plan  Continue with current plan of care      Recommendations for follow up therapy are one component of a multi-disciplinary discharge planning process, led by the attending physician.  Recommendations may be updated based on patient status, additional functional criteria and insurance authorization.    Recommendations                     Oral care BID   Frequent or constant Supervision/Assistance Cognitive communication deficit (Y78.295)     Continue with current plan  of care     Royce Macadamia  02/09/2023, 12:36 PM

## 2023-02-09 NOTE — H&P (View-Only) (Signed)
left ventricle has no regional wall motion abnormalities. The left ventricular internal cavity size was normal in size. There is mild concentric left ventricular hypertrophy. Left ventricular diastolic parameters are indeterminate. Right Ventricle: The right ventricular size is normal. No increase in right ventricular wall thickness. Right ventricular systolic function is normal. Left Atrium: Left atrial size was normal in size. Right Atrium: Right atrial size was normal in size. Pericardium: Pericardial effusion measuring up to 0.9 cm. A moderately sized pericardial effusion is present. There is diastolic collapse of the right ventricular free wall. There is no evidence of cardiac tamponade. Mitral Valve: The mitral valve is normal in structure. No evidence of mitral valve regurgitation. No evidence of  mitral valve stenosis. Tricuspid Valve: The tricuspid valve is normal in structure. Tricuspid valve regurgitation is not demonstrated. No evidence of tricuspid stenosis. Aortic Valve: The aortic valve is normal in structure. Aortic valve regurgitation is not visualized. No aortic stenosis is present. Pulmonic Valve: The pulmonic valve was normal in structure. Pulmonic valve regurgitation is not visualized. No evidence of pulmonic stenosis. Aorta: The aortic root is normal in size and structure. Venous: The inferior vena cava is normal in size with greater than 50% respiratory variability, suggesting right atrial pressure of 3 mmHg. IAS/Shunts: No atrial level shunt detected by color flow Doppler.  LEFT VENTRICLE PLAX 2D LVIDd:         4.20 cm LVIDs:         1.80 cm LV PW:         1.20 cm LV IVS:        1.20 cm LVOT diam:     1.90 cm LV SV:         78 LV SV Index:   45 LVOT Area:     2.84 cm  RIGHT VENTRICLE RV S prime:     17.70 cm/s TAPSE (M-mode): 2.5 cm LEFT ATRIUM             Index        RIGHT ATRIUM           Index LA Vol (A2C):   22.3 ml 12.88 ml/m  RA Area:     15.20 cm LA Vol (A4C):   32.8 ml 18.95 ml/m  RA Volume:   32.00 ml  18.49 ml/m LA Biplane Vol: 29.3 ml 16.93 ml/m  AORTIC VALVE LVOT Vmax:   143.00 cm/s LVOT Vmean:  100.000 cm/s LVOT VTI:    0.276 m  AORTA Ao Root diam: 3.20 cm Ao Asc diam:  3.20 cm  SHUNTS Systemic VTI:  0.28 m Systemic Diam: 1.90 cm Chilton Si MD Electronically signed by Chilton Si MD Signature Date/Time: 02/07/2023/5:23:03 PM    Final    VAS [place] LOWER EXTREMITY VENOUS (DVT)  Result Date: 02/07/2023  Lower Venous DVT Study Patient Name:  [name]  Date of Exam:   02/07/2023 Medical Rec #: [id number]      Accession #:    [id number] Date of Birth: Aug 02, 1970      Patient Gender: F Patient Age:   52 years Exam Location:  [facility] Procedure:      VAS [place] LOWER EXTREMITY VENOUS (DVT) Referring Phys: DAWOOD ELGERGAWY  --------------------------------------------------------------------------------  Indications: Swelling.  Risk Factors: None identified. Comparison Study: No prior studies. Performing Technologist: Chanda Busing RVT  Examination Guidelines: A complete evaluation includes B-mode imaging, spectral Doppler, color Doppler, and power Doppler as needed of all accessible portions of each vessel. Bilateral testing is considered  left ventricle has no regional wall motion abnormalities. The left ventricular internal cavity size was normal in size. There is mild concentric left ventricular hypertrophy. Left ventricular diastolic parameters are indeterminate. Right Ventricle: The right ventricular size is normal. No increase in right ventricular wall thickness. Right ventricular systolic function is normal. Left Atrium: Left atrial size was normal in size. Right Atrium: Right atrial size was normal in size. Pericardium: Pericardial effusion measuring up to 0.9 cm. A moderately sized pericardial effusion is present. There is diastolic collapse of the right ventricular free wall. There is no evidence of cardiac tamponade. Mitral Valve: The mitral valve is normal in structure. No evidence of mitral valve regurgitation. No evidence of  mitral valve stenosis. Tricuspid Valve: The tricuspid valve is normal in structure. Tricuspid valve regurgitation is not demonstrated. No evidence of tricuspid stenosis. Aortic Valve: The aortic valve is normal in structure. Aortic valve regurgitation is not visualized. No aortic stenosis is present. Pulmonic Valve: The pulmonic valve was normal in structure. Pulmonic valve regurgitation is not visualized. No evidence of pulmonic stenosis. Aorta: The aortic root is normal in size and structure. Venous: The inferior vena cava is normal in size with greater than 50% respiratory variability, suggesting right atrial pressure of 3 mmHg. IAS/Shunts: No atrial level shunt detected by color flow Doppler.  LEFT VENTRICLE PLAX 2D LVIDd:         4.20 cm LVIDs:         1.80 cm LV PW:         1.20 cm LV IVS:        1.20 cm LVOT diam:     1.90 cm LV SV:         78 LV SV Index:   45 LVOT Area:     2.84 cm  RIGHT VENTRICLE RV S prime:     17.70 cm/s TAPSE (M-mode): 2.5 cm LEFT ATRIUM             Index        RIGHT ATRIUM           Index LA Vol (A2C):   22.3 ml 12.88 ml/m  RA Area:     15.20 cm LA Vol (A4C):   32.8 ml 18.95 ml/m  RA Volume:   32.00 ml  18.49 ml/m LA Biplane Vol: 29.3 ml 16.93 ml/m  AORTIC VALVE LVOT Vmax:   143.00 cm/s LVOT Vmean:  100.000 cm/s LVOT VTI:    0.276 m  AORTA Ao Root diam: 3.20 cm Ao Asc diam:  3.20 cm  SHUNTS Systemic VTI:  0.28 m Systemic Diam: 1.90 cm Chilton Si MD Electronically signed by Chilton Si MD Signature Date/Time: 02/07/2023/5:23:03 PM    Final    VAS [place] LOWER EXTREMITY VENOUS (DVT)  Result Date: 02/07/2023  Lower Venous DVT Study Patient Name:  [name]  Date of Exam:   02/07/2023 Medical Rec #: [id number]      Accession #:    [id number] Date of Birth: Aug 02, 1970      Patient Gender: F Patient Age:   52 years Exam Location:  [facility] Procedure:      VAS [place] LOWER EXTREMITY VENOUS (DVT) Referring Phys: DAWOOD ELGERGAWY  --------------------------------------------------------------------------------  Indications: Swelling.  Risk Factors: None identified. Comparison Study: No prior studies. Performing Technologist: Chanda Busing RVT  Examination Guidelines: A complete evaluation includes B-mode imaging, spectral Doppler, color Doppler, and power Doppler as needed of all accessible portions of each vessel. Bilateral testing is considered  REDUCTION: This exam was performed according to the departmental dose-optimization program which includes automated exposure control, adjustment of the mA and/or kV according to patient size and/or use of iterative reconstruction technique. CONTRAST:  75mL OMNIPAQUE IOHEXOL 350  MG/ML SOLN COMPARISON:  None Available. FINDINGS: Cardiovascular: No significant coronary artery calcification. Global cardiac size is within normal limits. Small pericardial effusion. Central pulmonary arteries are enlarged in keeping with changes of pulmonary arterial hypertension. No intraluminal filling defect identified through the segmental level to suggest acute pulmonary embolism. The thoracic aorta is unremarkable. Mediastinum/Nodes: Visualized thyroid is unremarkable. No pathologic thoracic adenopathy. Nasoenteric feeding tube extends into the upper abdomen beyond the margin examination. Esophagus unremarkable. Lungs/Pleura: Small right pleural effusion. Mild bibasilar atelectasis. Lungs are otherwise clear. No pneumothorax. No central obstructing lesion. Upper Abdomen: No acute abnormality. Musculoskeletal: No chest wall abnormality. No acute or significant osseous findings. Review of the MIP images confirms the above findings. IMPRESSION: 1. No pulmonary embolism. 2. Small right pleural effusion. 3. Small pericardial effusion. 4. Enlarged central pulmonary arteries in keeping with changes of pulmonary arterial hypertension. Electronically Signed   By: Helyn Numbers M.D.   On: 02/08/2023 00:27   ECHOCARDIOGRAM COMPLETE  Result Date: 02/07/2023    ECHOCARDIOGRAM REPORT   Patient Name:   [name] Date of Exam: 02/07/2023 Medical Rec #:  [id number]     Height:       63.0 in Accession #:    [id number]    Weight:       154.1 lb Date of Birth:  [birth year]     BSA:          1.731 m Patient Age:    51 years      BP:           102/62 mmHg Patient Gender: F             HR:           135 bpm. Exam Location:  Inpatient Procedure: 2D Echo, Color Doppler and Cardiac Doppler Indications:    Sinus Tachycardia  History:        Patient has prior history of Echocardiogram examinations, most                 recent 03/10/2022. Infectious Colitis and COPD,                 Arrythmias:Cardiac Arrest and Tachycardia; Risk  Factors:Current                 Smoker.  Sonographer:    Milbert Coulter Referring Phys: 13 DAWOOD S ELGERGAWY IMPRESSIONS  1. Left ventricular ejection fraction, by estimation, is >75%. The left ventricle has hyperdynamic function. The left ventricle has no regional wall motion abnormalities. There is mild concentric left ventricular hypertrophy. Left ventricular diastolic parameters are indeterminate.  2. Right ventricular systolic function is normal. The right ventricular size is normal.  3. Pericardial effusion measuring up to 0.9 cm. Moderate pericardial effusion. There is no evidence of cardiac tamponade.  4. The mitral valve is normal in structure. No evidence of mitral valve regurgitation. No evidence of mitral stenosis.  5. The aortic valve is normal in structure. Aortic valve regurgitation is not visualized. No aortic stenosis is present.  6. The inferior vena cava is normal in size with greater than 50% respiratory variability, suggesting right atrial pressure of 3 mmHg. FINDINGS  Left Ventricle: Left ventricular ejection fraction, by estimation, is >75%. The left ventricle has hyperdynamic function. The  left ventricle has no regional wall motion abnormalities. The left ventricular internal cavity size was normal in size. There is mild concentric left ventricular hypertrophy. Left ventricular diastolic parameters are indeterminate. Right Ventricle: The right ventricular size is normal. No increase in right ventricular wall thickness. Right ventricular systolic function is normal. Left Atrium: Left atrial size was normal in size. Right Atrium: Right atrial size was normal in size. Pericardium: Pericardial effusion measuring up to 0.9 cm. A moderately sized pericardial effusion is present. There is diastolic collapse of the right ventricular free wall. There is no evidence of cardiac tamponade. Mitral Valve: The mitral valve is normal in structure. No evidence of mitral valve regurgitation. No evidence of  mitral valve stenosis. Tricuspid Valve: The tricuspid valve is normal in structure. Tricuspid valve regurgitation is not demonstrated. No evidence of tricuspid stenosis. Aortic Valve: The aortic valve is normal in structure. Aortic valve regurgitation is not visualized. No aortic stenosis is present. Pulmonic Valve: The pulmonic valve was normal in structure. Pulmonic valve regurgitation is not visualized. No evidence of pulmonic stenosis. Aorta: The aortic root is normal in size and structure. Venous: The inferior vena cava is normal in size with greater than 50% respiratory variability, suggesting right atrial pressure of 3 mmHg. IAS/Shunts: No atrial level shunt detected by color flow Doppler.  LEFT VENTRICLE PLAX 2D LVIDd:         4.20 cm LVIDs:         1.80 cm LV PW:         1.20 cm LV IVS:        1.20 cm LVOT diam:     1.90 cm LV SV:         78 LV SV Index:   45 LVOT Area:     2.84 cm  RIGHT VENTRICLE RV S prime:     17.70 cm/s TAPSE (M-mode): 2.5 cm LEFT ATRIUM             Index        RIGHT ATRIUM           Index LA Vol (A2C):   22.3 ml 12.88 ml/m  RA Area:     15.20 cm LA Vol (A4C):   32.8 ml 18.95 ml/m  RA Volume:   32.00 ml  18.49 ml/m LA Biplane Vol: 29.3 ml 16.93 ml/m  AORTIC VALVE LVOT Vmax:   143.00 cm/s LVOT Vmean:  100.000 cm/s LVOT VTI:    0.276 m  AORTA Ao Root diam: 3.20 cm Ao Asc diam:  3.20 cm  SHUNTS Systemic VTI:  0.28 m Systemic Diam: 1.90 cm Chilton Si MD Electronically signed by Chilton Si MD Signature Date/Time: 02/07/2023/5:23:03 PM    Final    VAS [place] LOWER EXTREMITY VENOUS (DVT)  Result Date: 02/07/2023  Lower Venous DVT Study Patient Name:  [name]  Date of Exam:   02/07/2023 Medical Rec #: [id number]      Accession #:    [id number] Date of Birth: Aug 02, 1970      Patient Gender: F Patient Age:   52 years Exam Location:  [facility] Procedure:      VAS [place] LOWER EXTREMITY VENOUS (DVT) Referring Phys: DAWOOD ELGERGAWY  --------------------------------------------------------------------------------  Indications: Swelling.  Risk Factors: None identified. Comparison Study: No prior studies. Performing Technologist: Chanda Busing RVT  Examination Guidelines: A complete evaluation includes B-mode imaging, spectral Doppler, color Doppler, and power Doppler as needed of all accessible portions of each vessel. Bilateral testing is considered  REDUCTION: This exam was performed according to the departmental dose-optimization program which includes automated exposure control, adjustment of the mA and/or kV according to patient size and/or use of iterative reconstruction technique. CONTRAST:  75mL OMNIPAQUE IOHEXOL 350  MG/ML SOLN COMPARISON:  None Available. FINDINGS: Cardiovascular: No significant coronary artery calcification. Global cardiac size is within normal limits. Small pericardial effusion. Central pulmonary arteries are enlarged in keeping with changes of pulmonary arterial hypertension. No intraluminal filling defect identified through the segmental level to suggest acute pulmonary embolism. The thoracic aorta is unremarkable. Mediastinum/Nodes: Visualized thyroid is unremarkable. No pathologic thoracic adenopathy. Nasoenteric feeding tube extends into the upper abdomen beyond the margin examination. Esophagus unremarkable. Lungs/Pleura: Small right pleural effusion. Mild bibasilar atelectasis. Lungs are otherwise clear. No pneumothorax. No central obstructing lesion. Upper Abdomen: No acute abnormality. Musculoskeletal: No chest wall abnormality. No acute or significant osseous findings. Review of the MIP images confirms the above findings. IMPRESSION: 1. No pulmonary embolism. 2. Small right pleural effusion. 3. Small pericardial effusion. 4. Enlarged central pulmonary arteries in keeping with changes of pulmonary arterial hypertension. Electronically Signed   By: Helyn Numbers M.D.   On: 02/08/2023 00:27   ECHOCARDIOGRAM COMPLETE  Result Date: 02/07/2023    ECHOCARDIOGRAM REPORT   Patient Name:   [name] Date of Exam: 02/07/2023 Medical Rec #:  [id number]     Height:       63.0 in Accession #:    [id number]    Weight:       154.1 lb Date of Birth:  [birth year]     BSA:          1.731 m Patient Age:    51 years      BP:           102/62 mmHg Patient Gender: F             HR:           135 bpm. Exam Location:  Inpatient Procedure: 2D Echo, Color Doppler and Cardiac Doppler Indications:    Sinus Tachycardia  History:        Patient has prior history of Echocardiogram examinations, most                 recent 03/10/2022. Infectious Colitis and COPD,                 Arrythmias:Cardiac Arrest and Tachycardia; Risk  Factors:Current                 Smoker.  Sonographer:    Milbert Coulter Referring Phys: 13 DAWOOD S ELGERGAWY IMPRESSIONS  1. Left ventricular ejection fraction, by estimation, is >75%. The left ventricle has hyperdynamic function. The left ventricle has no regional wall motion abnormalities. There is mild concentric left ventricular hypertrophy. Left ventricular diastolic parameters are indeterminate.  2. Right ventricular systolic function is normal. The right ventricular size is normal.  3. Pericardial effusion measuring up to 0.9 cm. Moderate pericardial effusion. There is no evidence of cardiac tamponade.  4. The mitral valve is normal in structure. No evidence of mitral valve regurgitation. No evidence of mitral stenosis.  5. The aortic valve is normal in structure. Aortic valve regurgitation is not visualized. No aortic stenosis is present.  6. The inferior vena cava is normal in size with greater than 50% respiratory variability, suggesting right atrial pressure of 3 mmHg. FINDINGS  Left Ventricle: Left ventricular ejection fraction, by estimation, is >75%. The left ventricle has hyperdynamic function. The  Patient ID: [name], female   DOB: [birth year], 52 y.o.   MRN: [id number]    Progress Note   Subjective   Day # 14 CC; admit with septic shock/cardiac arrest, history of Crohn's disease on Skyrizi Suspected C. difficile colitis-flex sigmoid 02/02/2023  Oral vancomycin day #7-dose to 500 mg p.o. 4 times daily yesterday due to new ileus Continues on tube feedings at 60 cc/h  KUB -today-report pending-moderate ileus on films yesterday  Flexi-Seal was to be discontinued yesterday, she has another rectal tube in place today having liquid stool around the rectal tube and into the bag. She says she feels about the same, nauseated, no appetite, no vomiting says abdominal pain about the same-she is unaware that she is stooling around the rectal tube  WBC 12.6/hemoglobin 10.5 Sodium 131/potassium 4.7 BUN 11/creatinine 0.55 Magnesium 2.1 proCalcitonin 0.29   Objective   Vital signs in last 24 hours: Temp:  [98.4 F (36.9 C)-98.7 F (37.1 C)] 98.4 F (36.9 C) (10/08 2300) Pulse Rate:  [129-143] 143 (10/08 1930) Resp:  [20] 20 (10/08 1930) BP: (126-128)/(52-58) 128/58 (10/08 1930) SpO2:  [100 %] 100 % (10/08 1930) Weight:  [66.3 kg] 66.3 kg (10/09 0300) Last BM Date : 02/08/23 General:   Chronically ill-appearing white female in NAD, sitting up in chair Heart: Tachy  regular rate and rhythm; no murmurs Lungs: Respirations even and unlabored, lungs CTA bilaterally Abdomen: Protuberant, tender across the mid and lower abdomen bowel sounds are present, maybe a bit softer than yesterday. Extremities:  Without edema. Neurologic:  Alert and oriented,  grossly normal neurologically, Tatian slow but appropriate,. Psych:  Cooperative. Normal mood and affect.  Intake/Output from previous day: 10/08 0701 - 10/09 0700 In: -  Out: 1000 [Urine:1000] Intake/Output this shift: No intake/output data recorded.  Lab Results: Recent Labs    02/08/23 0352 02/08/23 0727 02/09/23 0430  WBC  13.1* 12.6* 12.6*  HGB 10.4* 11.1* 10.5*  HCT 32.5* 34.6* 32.5*  PLT 1,031* 806* 1,050*   BMET Recent Labs    02/07/23 0746 02/08/23 0352 02/09/23 0430  NA 132* 135 131*  K 4.0 4.3 4.7  CL 101 103 100  CO2 24 21* 20*  GLUCOSE 136* 156* 217*  BUN 6 6 11   CREATININE 0.47 0.62 0.55  CALCIUM 8.4* 8.6* 8.7*   LFT No results for input(s): "PROT", "ALBUMIN", "AST", "ALT", "ALKPHOS", "BILITOT", "BILIDIR", "IBILI" in the last 72 hours. PT/INR No results for input(s): "LABPROT", "INR" in the last 72 hours.  Studies/Results: DG Abd 2 Views  Result Date: 02/08/2023 CLINICAL DATA:  Ileus.  Acute generalized abdominal pain. EXAM: ABDOMEN - 2 VIEW COMPARISON:  Abdominal radiograph 01/31/2023 FINDINGS: A feeding tube remains in place with tip in the region of the distal stomach or proximal duodenum. No intraperitoneal free air is identified. There is moderate gaseous distension of colon and likely mild distension of some small bowel loops. Right upper quadrant abdominal surgical clips are noted. No acute osseous abnormality is seen. IMPRESSION: Mild-to-moderate bowel distension suggestive of ileus. Electronically Signed   By: Sebastian Ache M.D.   On: 02/08/2023 12:42   CT Angio Chest Pulmonary Embolism (PE) W or WO Contrast  Result Date: 02/08/2023 CLINICAL DATA:  High probability pulmonary embolism, sinus tachycardia, elevated D-dimer EXAM: CT ANGIOGRAPHY CHEST WITH CONTRAST TECHNIQUE: Multidetector CT imaging of the chest was performed using the standard protocol during bolus administration of intravenous contrast. Multiplanar CT image reconstructions and MIPs were obtained to evaluate the vascular anatomy. RADIATION DOSE  Patient ID: [name], female   DOB: [birth year], 52 y.o.   MRN: [id number]    Progress Note   Subjective   Day # 14 CC; admit with septic shock/cardiac arrest, history of Crohn's disease on Skyrizi Suspected C. difficile colitis-flex sigmoid 02/02/2023  Oral vancomycin day #7-dose to 500 mg p.o. 4 times daily yesterday due to new ileus Continues on tube feedings at 60 cc/h  KUB -today-report pending-moderate ileus on films yesterday  Flexi-Seal was to be discontinued yesterday, she has another rectal tube in place today having liquid stool around the rectal tube and into the bag. She says she feels about the same, nauseated, no appetite, no vomiting says abdominal pain about the same-she is unaware that she is stooling around the rectal tube  WBC 12.6/hemoglobin 10.5 Sodium 131/potassium 4.7 BUN 11/creatinine 0.55 Magnesium 2.1 proCalcitonin 0.29   Objective   Vital signs in last 24 hours: Temp:  [98.4 F (36.9 C)-98.7 F (37.1 C)] 98.4 F (36.9 C) (10/08 2300) Pulse Rate:  [129-143] 143 (10/08 1930) Resp:  [20] 20 (10/08 1930) BP: (126-128)/(52-58) 128/58 (10/08 1930) SpO2:  [100 %] 100 % (10/08 1930) Weight:  [66.3 kg] 66.3 kg (10/09 0300) Last BM Date : 02/08/23 General:   Chronically ill-appearing white female in NAD, sitting up in chair Heart: Tachy  regular rate and rhythm; no murmurs Lungs: Respirations even and unlabored, lungs CTA bilaterally Abdomen: Protuberant, tender across the mid and lower abdomen bowel sounds are present, maybe a bit softer than yesterday. Extremities:  Without edema. Neurologic:  Alert and oriented,  grossly normal neurologically, Tatian slow but appropriate,. Psych:  Cooperative. Normal mood and affect.  Intake/Output from previous day: 10/08 0701 - 10/09 0700 In: -  Out: 1000 [Urine:1000] Intake/Output this shift: No intake/output data recorded.  Lab Results: Recent Labs    02/08/23 0352 02/08/23 0727 02/09/23 0430  WBC  13.1* 12.6* 12.6*  HGB 10.4* 11.1* 10.5*  HCT 32.5* 34.6* 32.5*  PLT 1,031* 806* 1,050*   BMET Recent Labs    02/07/23 0746 02/08/23 0352 02/09/23 0430  NA 132* 135 131*  K 4.0 4.3 4.7  CL 101 103 100  CO2 24 21* 20*  GLUCOSE 136* 156* 217*  BUN 6 6 11   CREATININE 0.47 0.62 0.55  CALCIUM 8.4* 8.6* 8.7*   LFT No results for input(s): "PROT", "ALBUMIN", "AST", "ALT", "ALKPHOS", "BILITOT", "BILIDIR", "IBILI" in the last 72 hours. PT/INR No results for input(s): "LABPROT", "INR" in the last 72 hours.  Studies/Results: DG Abd 2 Views  Result Date: 02/08/2023 CLINICAL DATA:  Ileus.  Acute generalized abdominal pain. EXAM: ABDOMEN - 2 VIEW COMPARISON:  Abdominal radiograph 01/31/2023 FINDINGS: A feeding tube remains in place with tip in the region of the distal stomach or proximal duodenum. No intraperitoneal free air is identified. There is moderate gaseous distension of colon and likely mild distension of some small bowel loops. Right upper quadrant abdominal surgical clips are noted. No acute osseous abnormality is seen. IMPRESSION: Mild-to-moderate bowel distension suggestive of ileus. Electronically Signed   By: Sebastian Ache M.D.   On: 02/08/2023 12:42   CT Angio Chest Pulmonary Embolism (PE) W or WO Contrast  Result Date: 02/08/2023 CLINICAL DATA:  High probability pulmonary embolism, sinus tachycardia, elevated D-dimer EXAM: CT ANGIOGRAPHY CHEST WITH CONTRAST TECHNIQUE: Multidetector CT imaging of the chest was performed using the standard protocol during bolus administration of intravenous contrast. Multiplanar CT image reconstructions and MIPs were obtained to evaluate the vascular anatomy. RADIATION DOSE

## 2023-02-09 NOTE — Progress Notes (Signed)
[facility] night cross cover note:   I was notified by RN that the patient is complaining of nausea.  I subsequently placed order for prn IV Zofran.    Newton Pigg, DO Hospitalist

## 2023-02-09 NOTE — Progress Notes (Signed)
Patient in yellow mews at start of shift

## 2023-02-09 NOTE — Progress Notes (Signed)
Occupational Therapy Treatment Patient Details Name: [name] MRN: [id number] DOB: 1971/01/19 Today's Date: 02/09/2023   History of present illness 52 yo female admitted 9/26 after found down for an unspecified amount of time with possible GIB, agonal breathing and pulseless with CPR 5 min. Intubated in [facility] ED and transferred to [facility]. Pt with AKI, elevated lactate, septic shock. 10/2 extubated and EGD. PMhx: COPD, chron's, HTN, DM, PTSD, seizure disorder, chronic back pain   OT comments  Pt continues need orientation to situation. Tearful when talking about her daughter who is concerned pt will not recover. Pt completed bed mobility with up to min assist and participated in UB dressing and oral care with min assist. Stood with min assist and RW for pericare and to change bed pad. Pt with c/o headache, RN aware. Patient will benefit from continued inpatient follow up therapy, <3 hours/day.       If plan is discharge home, recommend the following:  Assistance with cooking/housework;Direct supervision/assist for medications management;Direct supervision/assist for financial management;Assist for transportation;Help with stairs or ramp for entrance;A little help with walking and/or transfers;A lot of help with bathing/dressing/bathroom   Equipment Recommendations  Other (comment) (defer to next venue)    Recommendations for Other Services      Precautions / Restrictions Precautions Precautions: Fall;Other (comment) Precaution Comments: watch HR, cortrak, flexiseal Restrictions Weight Bearing Restrictions: No       Mobility Bed Mobility Overal bed mobility: Needs Assistance Bed Mobility: Supine to Sit, Sit to Supine     Supine to sit: Min assist Sit to supine: Contact guard assist   General bed mobility comments: assist to raise trunk    Transfers Overall transfer level: Needs assistance Equipment used: Rolling walker (2 wheels) Transfers: Sit to/from Stand Sit to Stand:  Min assist           General transfer comment: stood at bedside for pericare and to change bed pad     Balance Overall balance assessment: Needs assistance   Sitting balance-Leahy Scale: Fair Sitting balance - Comments: able to use B UEs during grooming without LOB     Standing balance-Leahy Scale: Poor Standing balance comment: B UE support and min assist                           ADL either performed or assessed with clinical judgement   ADL Overall ADL's : Needs assistance/impaired Eating/Feeding: Set up;Sitting Eating/Feeding Details (indicate cue type and reason): drinking water Grooming: Oral care;Sitting;Minimal assistance           Upper Body Dressing : Minimal assistance;Sitting           Toileting- Clothing Manipulation and Hygiene: Total assistance;Sit to/from stand              Extremity/Trunk Assessment              Vision       Perception     Praxis      Cognition Arousal: Alert Behavior During Therapy: WFL for tasks assessed/performed Overall Cognitive Status: Impaired/Different from baseline Area of Impairment: Memory, Following commands, Safety/judgement, Problem solving, Orientation                 Orientation Level: Disoriented to, Situation Current Attention Level: Selective Memory: Decreased short-term memory Following Commands: Follows one step commands with increased time Safety/Judgement: Decreased awareness of deficits, Decreased awareness of safety   Problem Solving: Slow processing, Decreased initiation, Difficulty sequencing  Exercises      Shoulder Instructions       General Comments      Pertinent Vitals/ Pain       Pain Assessment Pain Assessment: Faces Faces Pain Scale: Hurts little more Pain Location: head Pain Descriptors / Indicators: Aching Pain Intervention(s): Monitored during session, Repositioned  Home Living                                           Prior Functioning/Environment              Frequency  Min 1X/week        Progress Toward Goals  OT Goals(current goals can now be found in the care plan section)  Progress towards OT goals: Progressing toward goals  Acute Rehab OT Goals OT Goal Formulation: With patient Time For Goal Achievement: 02/17/23 Potential to Achieve Goals: Good  Plan      Co-evaluation                 AM-PAC OT "6 Clicks" Daily Activity     Outcome Measure   Help from another person eating meals?: A Little Help from another person taking care of personal grooming?: A Little Help from another person toileting, which includes using toliet, bedpan, or urinal?: Total Help from another person bathing (including washing, rinsing, drying)?: A Lot Help from another person to put on and taking off regular upper body clothing?: A Little Help from another person to put on and taking off regular lower body clothing?: Total 6 Click Score: 13    End of Session Equipment Utilized During Treatment: Gait belt;Rolling walker (2 wheels)  OT Visit Diagnosis: Unsteadiness on feet (R26.81);Other abnormalities of gait and mobility (R26.89);Muscle weakness (generalized) (M62.81);Other symptoms and signs involving cognitive function   Activity Tolerance Patient tolerated treatment well   Patient Left in bed;with call bell/phone within reach;with bed alarm set   Nurse Communication Patient requests pain meds        Time: 1324-4010 OT Time Calculation (min): 52 min  Charges: OT General Charges $OT Visit: 1 Visit OT Treatments $Self Care/Home Management : 38-52 mins  Berna Spare, OTR/L Acute Rehabilitation Services Office: [phone]   Evern Bio 02/09/2023, 3:45 PM

## 2023-02-10 ENCOUNTER — Other Ambulatory Visit: Payer: Self-pay

## 2023-02-10 ENCOUNTER — Encounter (HOSPITAL_COMMUNITY): Payer: Self-pay | Admitting: Pulmonary Disease

## 2023-02-10 ENCOUNTER — Inpatient Hospital Stay (HOSPITAL_COMMUNITY): Payer: Medicaid Other | Admitting: Registered Nurse

## 2023-02-10 ENCOUNTER — Inpatient Hospital Stay (HOSPITAL_COMMUNITY): Payer: Medicaid Other

## 2023-02-10 ENCOUNTER — Encounter (HOSPITAL_COMMUNITY)
Admission: EM | Disposition: A | Discharge status: Home Health | Payer: Self-pay | Source: Home / Self Care | Attending: Student

## 2023-02-10 DIAGNOSIS — A0472 Enterocolitis due to Clostridium difficile, not specified as recurrent: Secondary | ICD-10-CM | POA: Diagnosis not present

## 2023-02-10 DIAGNOSIS — I469 Cardiac arrest, cause unspecified: Secondary | ICD-10-CM | POA: Diagnosis not present

## 2023-02-10 DIAGNOSIS — K529 Noninfective gastroenteritis and colitis, unspecified: Secondary | ICD-10-CM | POA: Diagnosis not present

## 2023-02-10 DIAGNOSIS — G9341 Metabolic encephalopathy: Secondary | ICD-10-CM | POA: Diagnosis not present

## 2023-02-10 DIAGNOSIS — E119 Type 2 diabetes mellitus without complications: Secondary | ICD-10-CM

## 2023-02-10 DIAGNOSIS — J9601 Acute respiratory failure with hypoxia: Secondary | ICD-10-CM | POA: Diagnosis not present

## 2023-02-10 HISTORY — PX: FLEXIBLE SIGMOIDOSCOPY: SHX5431

## 2023-02-10 LAB — BASIC METABOLIC PANEL
Anion gap: 13 (ref 5–15)
BUN: 8 mg/dL (ref 6–20)
CO2: 26 mmol/L (ref 22–32)
Calcium: 9.3 mg/dL (ref 8.9–10.3)
Chloride: 96 mmol/L — ABNORMAL LOW (ref 98–111)
Creatinine, Ser: 0.51 mg/dL (ref 0.44–1.00)
GFR, Estimated: >60 mL/min (ref >60)
Glucose, Bld: 134 mg/dL — ABNORMAL HIGH (ref 70–99)
Potassium: 4.6 mmol/L (ref 3.5–5.1)
Sodium: 135 mmol/L (ref 135–145)

## 2023-02-10 LAB — CBC
HCT: 33.5 % — ABNORMAL LOW (ref 36.0–46.0)
Hemoglobin: 10.6 g/dL — ABNORMAL LOW (ref 12.0–15.0)
MCH: 27 pg (ref 26.0–34.0)
MCHC: 31.6 g/dL (ref 30.0–36.0)
MCV: 85.2 fL (ref 80.0–100.0)
Platelets: 1111 10*3/uL (ref 150–400)
RBC: 3.93 MIL/uL (ref 3.87–5.11)
RDW: 15.1 % (ref 11.5–15.5)
WBC: 10.9 10*3/uL — ABNORMAL HIGH (ref 4.0–10.5)
nRBC: 0 % (ref 0.0–0.2)

## 2023-02-10 LAB — GLUCOSE, CAPILLARY
Glucose-Capillary: 104 mg/dL — ABNORMAL HIGH (ref 70–99)
Glucose-Capillary: 115 mg/dL — ABNORMAL HIGH (ref 70–99)
Glucose-Capillary: 116 mg/dL — ABNORMAL HIGH (ref 70–99)
Glucose-Capillary: 149 mg/dL — ABNORMAL HIGH (ref 70–99)
Glucose-Capillary: 98 mg/dL (ref 70–99)

## 2023-02-10 LAB — MAGNESIUM: Magnesium: 2.2 mg/dL (ref 1.7–2.4)

## 2023-02-10 LAB — PHOSPHORUS: Phosphorus: 4.7 mg/dL — ABNORMAL HIGH (ref 2.5–4.6)

## 2023-02-10 LAB — PROCALCITONIN: Procalcitonin: 0.31 ng/mL

## 2023-02-10 SURGERY — SIGMOIDOSCOPY, FLEXIBLE
Anesthesia: Monitor Anesthesia Care

## 2023-02-10 MED ORDER — PANTOPRAZOLE SODIUM 40 MG IV SOLR: 40.0000 mg | INTRAVENOUS | Status: DC

## 2023-02-10 MED ORDER — PHENYLEPHRINE HCL (PRESSORS) 10 MG/ML IV SOLN
INTRAVENOUS | Status: DC | PRN
Start: 2023-02-10 — End: 2023-02-10
  Administered 2023-02-10: 80 ug via INTRAVENOUS
  Administered 2023-02-10: 160 ug via INTRAVENOUS
  Administered 2023-02-10: 80 ug via INTRAVENOUS
  Administered 2023-02-10: 160 ug via INTRAVENOUS
  Administered 2023-02-10: 80 ug via INTRAVENOUS

## 2023-02-10 MED ORDER — PANTOPRAZOLE SODIUM 40 MG IV SOLR
40.0000 mg | INTRAVENOUS | Status: DC
  Administered 2023-02-10 – 2023-02-28 (×18): 40 mg via INTRAVENOUS
  Filled 2023-02-10 (×18): qty 10

## 2023-02-10 MED ORDER — SODIUM CHLORIDE 0.9 % IV SOLN
750.0000 mg | Freq: Two times a day (BID) | INTRAVENOUS | Status: DC
  Administered 2023-02-10 – 2023-03-01 (×38): 750 mg via INTRAVENOUS
  Filled 2023-02-10 (×44): qty 7.5

## 2023-02-10 MED ORDER — ONDANSETRON HCL 4 MG/2ML IJ SOLN
INTRAMUSCULAR | Status: DC | PRN
  Administered 2023-02-10: 4 mg via INTRAVENOUS

## 2023-02-10 MED ORDER — SODIUM CHLORIDE 0.9 % IV SOLN: INTRAVENOUS | Status: AC

## 2023-02-10 MED ORDER — PROPOFOL 10 MG/ML IV BOLUS
INTRAVENOUS | Status: DC | PRN
  Administered 2023-02-10: 10 mg via INTRAVENOUS
  Administered 2023-02-10 (×2): 30 mg via INTRAVENOUS

## 2023-02-10 MED ORDER — METRONIDAZOLE 500 MG/100ML IV SOLN
500.0000 mg | Freq: Two times a day (BID) | INTRAVENOUS | Status: DC
  Administered 2023-02-10 – 2023-02-28 (×35): 500 mg via INTRAVENOUS
  Filled 2023-02-10 (×35): qty 100

## 2023-02-10 MED ORDER — LIDOCAINE 2% (20 MG/ML) 5 ML SYRINGE
INTRAMUSCULAR | Status: DC | PRN
  Administered 2023-02-10: 40 mg via INTRAVENOUS

## 2023-02-10 NOTE — Op Note (Signed)
[facility] Patient Name: [name] Procedure Date : 02/10/2023 MRN: [id number] Attending MD: Starr Lake. Myrtie Neither , MD, [id number] Date of Birth: [birth year] CSN: [id number] Age: 52 Admit Type: Inpatient Procedure:                Flexible Sigmoidoscopy Indications:              Generalized abdominal pain, Diarrhea, Personal                            history of Crohn's disease                           Longstanding colonic Crohn's disease, intolerant of                            anti-TNF therapy, recently started Skyrizi (due for                            third treatment), many years of poorly controlled                            disease with multiple flares and recurrent C.                            difficile. Establish care with [facility] earlier this                            year. Admitted since 01/27/2023 with septic picture                            and cardiac arrest, resuscitated, persistent                            profuse diarrhea. Sigmoidoscopy 1 week ago showing                            severe rectosigmoid colon inflammation with                            pseudomembranes and areas of dusky mucosa. That was                            when C. difficile was again diagnosed, currently on                            maximal medical therapy with vancomycin and IV                            Flagyl. 2 days ago patient developed partial                            colonic ileus on KUB. Exam today to evaluate  activity of colitis and potentially decompressed                            colon.                           Patient with persistent tachycardia and septic                            picture. Providers:                Starr Lake. Myrtie Neither, MD, Fransisca Connors, Marja Kays, Technician Referring MD:              Medicines:                Monitored Anesthesia Care Complications:            No immediate  complications. Estimated Blood Loss:     Estimated blood loss: none. Procedure:                Pre-Anesthesia Assessment:                           - Prior to the procedure, a History and Physical                            was performed, and patient medications and                            allergies were reviewed. The patient's tolerance of                            previous anesthesia was also reviewed. The risks                            and benefits of the procedure and the sedation                            options and risks were discussed with the patient.                            All questions were answered, and informed consent                            was obtained. Prior Anticoagulants: The patient has                            taken no anticoagulant or antiplatelet agents. ASA                            Grade Assessment: IV - A patient with severe                            systemic disease that is a  constant threat to life.                            After reviewing the risks and benefits, the patient                            was deemed in satisfactory condition to undergo the                            procedure.                           After obtaining informed consent, the scope was                            passed under direct vision. The CF-HQ190L ([id number])                            Olympus coloscope was introduced through the anus                            and advanced to the the sigmoid colon. The flexible                            sigmoidoscopy was somewhat difficult due to poor                            bowel prep. Scope In: 2:02:12 PM Scope Out: 2:10:29 PM Total Procedure Duration: 0 hours 8 minutes 17 seconds  Findings:      The perianal and digital rectal examinations were normal. No visible       perianal fistula or other Crohn's activity seen.      Diffuse severe inflammation characterized by friability, loss of       vascularity, pseudopolyps  and deep ulcerations was found in the rectum       and in the sigmoid colon to the extent of insertion. This is worse than       the photos and description from 1 week ago. There are many areas of deep       ulceration with prominent pseudomembranes and areas of dusky mucosa as       well as friability. Scope could only be advanced to somewhere in the       sigmoid colon and not further due to poor visualization and concerns       about increased risk of perforation. (Therefore decompression cannot be       performed) no biopsies were taken either. 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 The adult colonoscope was able to pass this without resistance. Impression:               - Diffuse severe inflammation was found in the                            rectum and in the sigmoid colon.                           -  No specimens collected.                           This severe pseudomembranous colitis appears to be                            a combination of longstanding Crohn's disease that                            has thus far failed medical management, active C.                            difficile infection and possible component of                            ischemia from the patient's cardiovascular collapse                            at the time of admission 2 weeks ago. There is no                            way of determining by appearance (nor biopsies if                            they were to have been taking) how much each of                            these conditions is contributing to the overall                            picture. This patient's has a persistent septic                            picture, partial colonic pseudoobstruction and is                            failing medical management. Recommendation:           - Stop nasoenteric tube feeds                           Place PICC line                            Surgical consult will be obtained today to discuss                            total proctocolectomy.                           Daughter will be updated by phone Procedure Code(s):        --- Professional ---                           [phone], Sigmoidoscopy, flexible; diagnostic,  including collection of specimen(s) by brushing or                            washing, when performed (separate procedure) Diagnosis Code(s):        --- Professional ---                           K62.89, Other specified diseases of anus and rectum                           K52.9, Noninfective gastroenteritis and colitis,                            unspecified                           R10.84, Generalized abdominal pain                           R19.7, Diarrhea, unspecified                           Z87.19, Personal history of other diseases of the                            digestive system CPT copyright 2022 [employer]. All rights reserved. The codes documented in this report are preliminary and upon coder review may  be revised to meet current compliance requirements. Estle Huguley L. Myrtie Neither, MD 02/10/2023 2:32:34 PM This report has been signed electronically. Number of Addenda: 0

## 2023-02-10 NOTE — Progress Notes (Signed)
PROGRESS NOTE        PATIENT DETAILS Name: [name] Age: 52 y.o. Sex: female Date of Birth: 09-20-1970 Admit Date: 01/27/2023 Admitting Physician Lynnell Catalan, MD QMV:[id number], Marcelino Duster, PA-C  Brief Summary: Patient is a 52 y.o.  female with history of Crohn's disease-on Skyrizi, HTN,DM-2, COPD-who had diarrhea at home-refused to seek medical attention-she was found unresponsive by family-EMS was called-she was found to be lying in a large pool of bloody emesis +/- diarrhea-SBP in the 60s-minimally responsive.  She was brought to the ED-where she was unresponsive with agonal breathing and pulseless-CPR was initiated for approximately 5 minutes-with ROSC.  She was intubated-and admitted to the ICU.  Upon further evaluation with a flexible sigmoidoscopy-she was found to have changes consistent with pseudomembranous colitis.  See below for further details.  Significant events: 9/26>> admit to ICU-intubated/pressors 9/29>> off pressors 10/2>> extubated 10/4>> transferred to [facility]  Significant studies: 9/26>> CXR: No pneumonia 9/26>> x-ray abdomen: Mildly dilated small bowel loops 9/26>> CT head: No acute abnormalities 9/26>> CT abdomen/pelvis: Infectious or inflammatory proctocolitis 9/30>> CT abdomen/pelvis: Fused colitis/proctitis-small bowel enteritis 10/7>> CTA chest: No PE 10/7>> bilateral lower extremity Doppler: No DVT 10/7>> echo: EF> 75% 10/8>> x-ray abdomen: Mild to moderate bowel distention suggestive of ileus. 10/10>> x-ray abdomen: Stable ileus  Significant microbiology data: 9/26>> COVID/influenza/RSV PCR: Negative 9/26>> blood: Staph hominis (contamination) 9/29>> stool GI pathogen panel: Negative 9/29>> stool C. difficile: Antigen +ve-toxin -ve  Procedures: 10/2>> flexible sigmoidoscopy: Severe mucosal changes in the rectum-consistent with Crohn's disease-and as well as pseudomembranes-consistent with C. difficile  colitis  Consults: None  Subjective: Continues to have diarrhea  Objective: Vitals: Blood pressure 123/60, pulse (!) 128, temperature 97.9 F (36.6 C), temperature source Oral, resp. rate 20, height 5' 2.99" (1.6 m), weight 66.3 kg, SpO2 93%.   Exam: Gen Exam:Alert awake-not in any distress HEENT:atraumatic, normocephalic Chest: B/L clear to auscultation anteriorly CVS:S1S2 regular Abdomen: Soft-hardly any distention-minimal tenderness at times but not consistent. Extremities:no edema Neurology: Non focal Skin: no rash  Pertinent Labs/Radiology:    Latest Ref Rng & Units 02/10/2023    6:07 AM 02/09/2023    4:30 AM 02/08/2023    7:27 AM  CBC  WBC 4.0 - 10.5 K/uL 10.9  12.6  12.6   Hemoglobin 12.0 - 15.0 g/dL 29.5  28.4  13.2   Hematocrit 36.0 - 46.0 % 33.5  32.5  34.6   Platelets 150 - 400 K/uL 1,111  1,050  806     Lab Results  Component Value Date   NA 135 02/10/2023   K 4.6 02/10/2023   CL 96 (L) 02/10/2023   CO2 26 02/10/2023      Assessment/Plan: Septic shock secondary to fulminant Clostridium difficile infection Sepsis physiology improved Did require pressors on initial presentation Cultures negative  Cardiac arrest Due to septic shock This was on initial presentation-briefly required CPR Currently completely awake/alert Echo stable CT angiogram chest-no PE Telemetry without any arrhythmias  Acute metabolic encephalopathy Secondary septic shock Mentation improved-awake/alert this morning.  Fulminant C. difficile colitis with ileus Continues to have some diarrhea Keep K> 4, Mg> 2 On oral vancomycin-dosage escalated on 10/8 GI planning  flexible sigmoidoscopy today. Await further recommendations from GI  History of Crohn's disease Recently started on Skyrizi-second dose on 9/13  Sinus tachycardia Felt to be secondary to physiological stressors Echo/CTA chest/TSH all  stable On metoprolol Cardiology followed but no further recommendation  anticipation is that heart rate will improve as a physiological stresses get better.  Moderate pericardial effusion-no tamponade Per cardiology-likely due to critical illness-plans are to follow in the outpatient setting  QTc prolongation Resolved as of 10/7 EKG Keep K> 4, Mg> 2 Telemetry monitoring  Oropharyngeal dysphagia Secondary to encephalopathy/debility/deconditioning Cortrak tube in place-feedings ongoing On full liquid diet-but currently n.p.o. for flex sig. Remove NG tube when diet was stable  Normocytic anemia Due to critical illness Follow CBC  Thrombocytosis Likely due to acute illness Follow CBC  COPD Not in exacerbation-bronchodilators  Seizure disorder Stable Keppra  Anxiety disorder As needed Xanax  DM-2 (A1c 5.8 on 9/26) CBG stable Semglee 5 units twice daily+ SSI  Recent Labs    02/09/23 2325 02/10/23 0426 02/10/23 0800  GLUCAP 175* 116* 149*    Debility/deconditioning Secondary to acute illness PT/OT-SNF recommended  Nutrition Status: Nutrition Problem: Inadequate oral intake Etiology: inability to eat Signs/Symptoms: NPO status Interventions: Refer to RD note for recommendations  BMI: Estimated body mass index is 25.9 kg/m as calculated from the following:   Height as of this encounter: 5' 2.99" (1.6 m).   Weight as of this encounter: 66.3 kg.   Code status:   Code Status: Full Code   DVT Prophylaxis: heparin injection 5,000 Units Start: 01/30/23 0830 SCDs Start: 01/27/23 1111   Family Communication: Daughter-Amber [phone]-updated over the phone on 10/10   Disposition Plan: Status is: Inpatient Remains inpatient appropriate because: Severity of illness   Planned Discharge Destination:Skilled nursing facility   Diet: Diet Order             Diet NPO time specified  Diet effective midnight                     Antimicrobial agents: Anti-infectives (From admission, onward)    Start     Dose/Rate Route  Frequency Ordered Stop   02/08/23 1800  vancomycin (VANCOCIN) 50 mg/mL oral solution SOLN 500 mg        500 mg Per Tube Every 6 hours 02/08/23 1325 02/12/23 1759   02/03/23 2200  metroNIDAZOLE (FLAGYL) tablet 500 mg        500 mg Per Tube Every 12 hours 02/03/23 1406 02/06/23 2148   02/02/23 1815  vancomycin (VANCOCIN) 50 mg/mL oral solution SOLN 125 mg  Status:  Discontinued        125 mg Per Tube Every 6 hours 02/02/23 1717 02/08/23 1325   02/02/23 1545  vancomycin (VANCOCIN) capsule 125 mg  Status:  Discontinued        125 mg Oral 4 times daily 02/02/23 1530 02/02/23 1717   01/27/23 1430  cefTRIAXone (ROCEPHIN) 2 g in sodium chloride 0.9 % 100 mL IVPB        2 g 200 mL/hr over 30 Minutes Intravenous Every 24 hours 01/27/23 1340 02/05/23 1403   01/27/23 1400  metroNIDAZOLE (FLAGYL) IVPB 500 mg  Status:  Discontinued        500 mg 100 mL/hr over 60 Minutes Intravenous Every 12 hours 01/27/23 1340 02/03/23 1406   01/27/23 0545  vancomycin (VANCOREADY) IVPB 1250 mg/250 mL        1,250 mg 166.7 mL/hr over 90 Minutes Intravenous  Once 01/27/23 0542 01/27/23 0809   01/27/23 0545  ceFEPIme (MAXIPIME) 2 g in sodium chloride 0.9 % 100 mL IVPB        2 g 200 mL/hr over 30  Minutes Intravenous  Once 01/27/23 0542 01/27/23 0621   01/27/23 0545  metroNIDAZOLE (FLAGYL) IVPB 500 mg        500 mg 100 mL/hr over 60 Minutes Intravenous  Once 01/27/23 0542 01/27/23 0741        MEDICATIONS: Scheduled Meds:  Chlorhexidine Gluconate Cloth  6 each Topical Daily   feeding supplement  237 mL Oral BID BM   feeding supplement (PROSource TF20)  60 mL Per Tube Daily   fiber supplement (BANATROL TF)  60 mL Per Tube BID   free water  200 mL Per Tube Q8H   Gerhardt's butt cream   Topical BID   heparin injection (subcutaneous)  5,000 Units Subcutaneous Q8H   insulin aspart  0-9 Units Subcutaneous Q4H   insulin glargine-yfgn  5 Units Subcutaneous BID   levETIRAcetam  750 mg Per Tube BID   metoprolol  succinate  50 mg Oral QPM   mouth rinse  15 mL Mouth Rinse 4 times per day   pantoprazole  40 mg Oral Daily   vancomycin  500 mg Per Tube Q6H   Continuous Infusions:  sodium chloride Stopped (02/02/23 0725)   PRN Meds:.sodium chloride, acetaminophen, albuterol, ALPRAZolam, magic mouthwash, ondansetron (ZOFRAN) IV   I have personally reviewed following labs and imaging studies  LABORATORY DATA: CBC: Recent Labs  Lab 02/07/23 0746 02/08/23 0352 02/08/23 0727 02/09/23 0430 02/10/23 0607  WBC 14.2* 13.1* 12.6* 12.6* 10.9*  HGB 9.3* 10.4* 11.1* 10.5* 10.6*  HCT 29.6* 32.5* 34.6* 32.5* 33.5*  MCV 86.8 86.0 88.3 87.8 85.2  PLT 823* 1,031* 806* 1,050* 1,111*    Basic Metabolic Panel: Recent Labs  Lab 02/05/23 1720 02/07/23 0746 02/08/23 0352 02/09/23 0430 02/10/23 0607  NA 134* 132* 135 131* 135  K 4.1 4.0 4.3 4.7 4.6  CL 104 101 103 100 96*  CO2 22 24 21* 20* 26  GLUCOSE 229* 136* 156* 217* 134*  BUN 12 6 6 11 8   CREATININE 0.57 0.47 0.62 0.55 0.51  CALCIUM 7.8* 8.4* 8.6* 8.7* 9.3  MG 2.0 1.8 2.2 2.1 2.2  PHOS 3.0 3.4 3.6 3.6 4.7*    GFR: Estimated Creatinine Clearance: 74.5 mL/min (by C-G formula based on SCr of 0.51 mg/dL).  Liver Function Tests: Recent Labs  Lab 02/04/23 0537  AST 22  ALT 19  ALKPHOS 123  BILITOT 0.5  PROT 4.3*  ALBUMIN <1.5*   No results for input(s): "LIPASE", "AMYLASE" in the last 168 hours. No results for input(s): "AMMONIA" in the last 168 hours.  Coagulation Profile: No results for input(s): "INR", "PROTIME" in the last 168 hours.  Cardiac Enzymes: No results for input(s): "CKTOTAL", "CKMB", "CKMBINDEX", "TROPONINI" in the last 168 hours.  BNP (last 3 results) No results for input(s): "PROBNP" in the last 8760 hours.  Lipid Profile: No results for input(s): "CHOL", "HDL", "LDLCALC", "TRIG", "CHOLHDL", "LDLDIRECT" in the last 72 hours.  Thyroid Function Tests: No results for input(s): "TSH", "T4TOTAL", "FREET4",  "T3FREE", "THYROIDAB" in the last 72 hours.   Anemia Panel: No results for input(s): "VITAMINB12", "FOLATE", "FERRITIN", "TIBC", "IRON", "RETICCTPCT" in the last 72 hours.  Urine analysis:    Component Value Date/Time   COLORURINE AMBER (A) 01/27/2023 0514   APPEARANCEUR HAZY (A) 01/27/2023 0514   LABSPEC 1.020 01/27/2023 0514   PHURINE 5.0 01/27/2023 0514   GLUCOSEU NEGATIVE 01/27/2023 0514   GLUCOSEU NEG mg/dL 02/27/2535 6440   HGBUR NEGATIVE 01/27/2023 0514   BILIRUBINUR NEGATIVE 01/27/2023 0514   KETONESUR 5 (A)  01/27/2023 0514   PROTEINUR 30 (A) 01/27/2023 0514   UROBILINOGEN 0.2 12/26/2013 1530   NITRITE NEGATIVE 01/27/2023 0514   LEUKOCYTESUR TRACE (A) 01/27/2023 0514    Sepsis Labs: Lactic Acid, Venous    Component Value Date/Time   LATICACIDVEN 1.0 01/29/2023 0816    MICROBIOLOGY: No results found for this or any previous visit (from the past 240 hour(s)).  RADIOLOGY STUDIES/RESULTS: DG Abd 2 Views  Result Date: 02/10/2023 CLINICAL DATA:  Ileus EXAM: ABDOMEN - 2 VIEW COMPARISON:  02/09/2023 FINDINGS: Supine and upright frontal views of the abdomen and pelvis are obtained. Enteric catheter tip projects over the proximal duodenum. Nonspecific gaseous distension of the large and small bowel, similar to prior study, consistent with given history of ileus. No masses or abnormal calcifications. No free gas in the greater peritoneal sac. Lung bases are clear. IMPRESSION: 1. Stable diffuse gaseous distension of the large and small bowel compatible with ileus. Electronically Signed   By: Sharlet Salina M.D.   On: 02/10/2023 09:09   DG Abd 2 Views  Result Date: 02/09/2023 CLINICAL DATA:  Ileus.  No abdominal complaints. EXAM: ABDOMEN - 2 VIEW COMPARISON:  Abdominal x-ray from yesterday. FINDINGS: Unchanged feeding tube with tip in the proximal duodenal. Similar mild gaseous distention of small and large bowel. No intraperitoneal free air. No acute osseous abnormality.  IMPRESSION: 1. Similar mild ileus. Electronically Signed   By: Obie Dredge M.D.   On: 02/09/2023 10:43     LOS: 14 days   Jeoffrey Massed, MD  [facility]    To contact the attending provider between 7A-7P or the covering provider during after hours 7P-7A, please log into the web site [email or web address] and access using universal [place] password for that web site. If you do not have the password, please call the hospital operator.  02/10/2023, 11:42 AM

## 2023-02-10 NOTE — Anesthesia Postprocedure Evaluation (Signed)
Anesthesia Post Note  Patient: [name]  Procedure(s) Performed: FLEXIBLE SIGMOIDOSCOPY     Patient location during evaluation: PACU Anesthesia Type: MAC Level of consciousness: awake and alert Pain management: pain level controlled Vital Signs Assessment: post-procedure vital signs reviewed and stable Respiratory status: spontaneous breathing, nonlabored ventilation and respiratory function stable Cardiovascular status: stable and blood pressure returned to baseline Postop Assessment: no apparent nausea or vomiting Anesthetic complications: no  No notable events documented.  Last Vitals:  Vitals:   02/10/23 1430 02/10/23 1440  BP: (!) 109/52 (!) 120/42  Pulse: (!) 126 (!) 125  Resp: (!) 29 (!) 29  Temp:    SpO2: 95% 95%    Last Pain:  Vitals:   02/10/23 1440  TempSrc:   PainSc: 0-No pain                 Cathleen Yagi,W. EDMOND

## 2023-02-10 NOTE — Interval H&P Note (Signed)
History and Physical Interval Note:  02/10/2023 1:37 PM  [name]  has presented today for surgery, with the diagnosis of ileus, diarrhea , Crohns , Cdiff colitis.  The various methods of treatment have been discussed with the patient and family. After consideration of risks, benefits and other options for treatment, the patient has consented to  Procedure(s): FLEXIBLE SIGMOIDOSCOPY (N/A) as a surgical intervention.  The patient's history has been reviewed, patient examined, no change in status, stable for surgery.  I have reviewed the patient's chart and labs.  Questions were answered to the patient's satisfaction.     Charlie Pitter III

## 2023-02-10 NOTE — Anesthesia Preprocedure Evaluation (Addendum)
Anesthesia Evaluation  Patient identified by MRN, date of birth, ID band Patient awake    Reviewed: Allergy & Precautions, H&P , NPO status , Patient's Chart, lab work & pertinent test results, reviewed documented beta blocker date and time   Airway Mallampati: II  TM Distance: >3 FB Neck ROM: Full    Dental no notable dental hx. (+) Partial Lower, Teeth Intact, Dental Advisory Given   Pulmonary asthma , COPD, Current Smoker   Pulmonary exam normal breath sounds clear to auscultation       Cardiovascular hypertension, Pt. on medications and Pt. on home beta blockers + DOE   Rhythm:Regular Rate:Normal     Neuro/Psych Seizures -, Well Controlled,   Anxiety Depression       GI/Hepatic Neg liver ROS,GERD  Medicated,,  Endo/Other  diabetes    Renal/GU negative Renal ROS  negative genitourinary   Musculoskeletal  (+) Arthritis , Osteoarthritis,    Abdominal   Peds  Hematology negative hematology ROS (+)   Anesthesia Other Findings   Reproductive/Obstetrics negative OB ROS                             Anesthesia Physical Anesthesia Plan  ASA: 3  Anesthesia Plan: MAC   Post-op Pain Management: Minimal or no pain anticipated   Induction: Intravenous  PONV Risk Score and Plan: 1 and Propofol infusion  Airway Management Planned: Natural Airway and Simple Face Mask  Additional Equipment:   Intra-op Plan:   Post-operative Plan:   Informed Consent: I have reviewed the patients History and Physical, chart, labs and discussed the procedure including the risks, benefits and alternatives for the proposed anesthesia with the patient or authorized representative who has indicated his/her understanding and acceptance.     Dental advisory given  Plan Discussed with: CRNA  Anesthesia Plan Comments:        Anesthesia Quick Evaluation

## 2023-02-10 NOTE — Transfer of Care (Signed)
Immediate Anesthesia Transfer of Care Note  Patient: [name]  Procedure(s) Performed: FLEXIBLE SIGMOIDOSCOPY  Patient Location: PACU  Anesthesia Type:MAC  Level of Consciousness: awake, alert , and oriented  Airway & Oxygen Therapy: Patient Spontanous Breathing  Post-op Assessment: Report given to RN and Post -op Vital signs reviewed and stable  Post vital signs: Reviewed and stable  Last Vitals:  Vitals Value Taken Time  BP 92/60 02/10/23 1423  Temp 37 C 02/10/23 1423  Pulse 125 02/10/23 1423  Resp 30 02/10/23 1423  SpO2 94 % 02/10/23 1423  Vitals shown include unfiled device data.  Last Pain:  Vitals:   02/10/23 1423  TempSrc: Tympanic  PainSc:       Patients Stated Pain Goal: 0 (02/08/23 1300)  Complications: No notable events documented.

## 2023-02-10 NOTE — Plan of Care (Signed)

## 2023-02-10 NOTE — Consult Note (Signed)
[name] [birth year]  [id number].    Requesting MD: Dr. Amada Jupiter Chief Complaint/Reason for Consult: c. Diff colitis with pseudomembranes  HPI:  This is a 52 yo white female with a history of Crohns' disease on Skyri[place], but refractory to treatment per GI, HTN, DM, COPD who has some encephalopathy right now and was not able to provide significant history.  Most of her history is obtained from her chart.  She was found down at home on 01/27/23 in a pool of what was either blood emesis or diarrhea.  She was brought to the ED where she had a code event with 5 minutes of CPR with ROSC.  She underwent a flex sig which revealed changes c/w pseudomembranous colitis.  She has gradually improved some and was extubated and transferred out of the ICU to a progressive floor.  She had had an Cortrak placed with TFs. However, these were stopped yesterday secondary to worsening pain and overall clinical status.  She has now been noted to have C diff colitis as well.  She was started on vancomycin.  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  She returned to the endoscopy suite today for a repeat flex sig.  She was found to have worsening pseudomembranes felt to be secondary to c diff, refractory crohn's, and some ischemia from downtime all contributing to this worsening appearance.  She is tachy in the 130s, WBC around 10, BP stable, and overall HDS currently.  We have been asked to see her though for consideration of surgical intervention given worsening appearance of her colon and refractory to current treatment.  ROS: ROS: see HPI.  She does c/o some abdominal pain and diarrhea.  She has difficulty remembering some things prior to her code event, etc.  Family History  Problem Relation Age of Onset   Cancer Mother    Heart failure Mother    Hypertension Mother    Heart attack Mother    Heart attack Father 26   Cancer Father 61   Heart failure Father    Diabetes Father    Crohn's  disease Cousin     Past Medical History:  Diagnosis Date   Anxiety    Asthma    Chronic low back pain    Collagen vascular disease (HCC)    COPD (chronic obstructive pulmonary disease) (HCC)    Crohn's disease (HCC)    GERD (gastroesophageal reflux disease)    EGD 01/2007 by Dr.Rourke small hiatal hernia s/p 56 french maloney    History of head injury    Hypertension    IBS (irritable bowel syndrome)    PSVT (paroxysmal supraventricular tachycardia) (HCC)    PTSD (post-traumatic stress disorder)    Recurrent chest pain    Seizure disorder (HCC)    Type 2 diabetes mellitus (HCC)     Past Surgical History:  Procedure Laterality Date   ABDOMINAL HYSTERECTOMY     with right salpingo oophorectomy 2005   APPENDECTOMY  2006   BALLOON DILATION N/A 02/19/2021   Procedure: BALLOON DILATION;  Surgeon: Lanelle Bal, DO;  [facility];  Service: Endoscopy;  Laterality: N/A;  Sigmoid colon stricture   BIOPSY  02/06/2021   Procedure: BIOPSY;  Surgeon: Lanelle Bal, DO;  [facility]: [facility];  Service: Endoscopy;;   BIOPSY  02/19/2021   Procedure: BIOPSY;  Surgeon: Lanelle Bal, DO;  [facility];  Service: Endoscopy;;   BIOPSY  07/15/2021  Procedure: BIOPSY;  Surgeon: Malissa Hippo, MD;  [facility];  Service: Endoscopy;;   CESAREAN SECTION     1996   CHOLECYSTECTOMY     COLONOSCOPY  2010   Dr. Jena Gauss; negative except for hemorrhoids   ESOPHAGEAL DILATION N/A 10/25/2014   Procedure: ESOPHAGEAL DILATION;  Surgeon: Malissa Hippo, MD;  [facility];  Service: Endoscopy;  Laterality: N/A;   ESOPHAGOGASTRODUODENOSCOPY N/A 10/25/2014   Procedure: ESOPHAGOGASTRODUODENOSCOPY (EGD);  Surgeon: Malissa Hippo, MD;  [facility];  Service: Endoscopy;  Laterality: N/A;  1250   FLEXIBLE SIGMOIDOSCOPY N/A 02/06/2021   Procedure: FLEXIBLE SIGMOIDOSCOPY;  Surgeon: Lanelle Bal, DO;  [facility];  Service:  Endoscopy;  Laterality: N/A;   FLEXIBLE SIGMOIDOSCOPY N/A 02/19/2021   Procedure: FLEXIBLE SIGMOIDOSCOPY;  Surgeon: Lanelle Bal, DO;  [facility];  Service: Endoscopy;  Laterality: N/A;   FLEXIBLE SIGMOIDOSCOPY N/A 02/02/2023   Procedure: FLEXIBLE SIGMOIDOSCOPY;  Surgeon: Beverley Fiedler, MD;  Location: [facility] ENDOSCOPY;  Service: Gastroenterology;  Laterality: N/A;   OOPHORECTOMY     left for torsion and ovarian fibroma; uterine myoma resected 1995   SIGMOIDOSCOPY  07/15/2021   Procedure: SIGMOIDOSCOPY;  Surgeon: Malissa Hippo, MD;  [facility];  Service: Endoscopy;;    Social History:  reports that she has been smoking cigarettes. She started smoking about 38 years ago. She has a 19.2 pack-year smoking history. She has been exposed to tobacco smoke. She has never used smokeless tobacco. She reports current drug use. Drug: Marijuana. She reports that she does not drink alcohol.  Allergies:  Allergies  Allergen Reactions   Codeine Shortness Of Breath, Swelling and Rash    Throat swelling   Dilaudid [Hydromorphone Hcl] Shortness Of Breath and Swelling   Hydrocodone Shortness Of Breath, Swelling and Rash   Ketorolac Nausea And Vomiting and Swelling    Pt reports n/v and swelling to throat   Morphine And Codeine Shortness Of Breath, Swelling and Rash   Oxycodone Shortness Of Breath, Swelling and Rash    Patient states ALL pain medications make her deathly sick.   Penicillins Shortness Of Breath, Swelling and Other (See Comments)    Throat swells 02/06/21--TOLERATES CEFTRIAXONE     Nickel Rash   Pregabalin Other (See Comments)   Acetaminophen     Per MD patient states she can't take Tylenol because of liver enzymes   Ativan [Lorazepam] Other (See Comments)    Migraines.   Strawberry Extract Swelling   Watermelon Concentrate Swelling   Albuterol Palpitations   Benadryl [Diphenhydramine Hcl] Palpitations   Humira [Adalimumab] Rash   Latex Rash   Remicade  [Infliximab] Rash    Blisters and Welts    Tramadol Rash    Rash and itching    Medications Prior to Admission  Medication Sig Dispense Refill   acetaminophen (TYLENOL) 325 MG tablet Take 2 tablets (650 mg total) by mouth every 6 (six) hours as needed for mild pain or headache (or Fever >/= 101).     acetaminophen (TYLENOL) 500 MG tablet Take by mouth.     ALPRAZolam (XANAX) 1 MG tablet Take 1 mg by mouth 3 (three) times daily as needed for anxiety.     amitriptyline (ELAVIL) 100 MG tablet Take 100 mg by mouth at bedtime.     budesonide (ENTOCORT EC) 3 MG 24 hr capsule Take 9 mg by mouth daily.     cetirizine (ZYRTEC) 10 MG tablet  Take 10 mg by mouth daily.     clotrimazole-betamethasone (LOTRISONE) cream SMARTSIG:Sparingly Topical Twice Daily PRN     cyclobenzaprine (FLEXERIL) 10 MG tablet Take 10 mg by mouth 3 (three) times daily as needed.     DULoxetine (CYMBALTA) 30 MG capsule Take 30 mg by mouth daily.     feeding supplement (ENSURE ENLIVE / ENSURE PLUS) LIQD Take 237 mLs by mouth 2 (two) times daily between meals.     folic acid (FOLVITE) 1 MG tablet Take 1 tablet (1 mg total) by mouth daily.     gabapentin (NEURONTIN) 300 MG capsule TAKE (1) CAPSULE BY MOUTH TWICE DAILY.     hydrOXYzine (ATARAX) 25 MG tablet Take 25 mg by mouth 3 (three) times daily as needed for anxiety.     hydrOXYzine (ATARAX) 50 MG tablet Take 25-50 mg by mouth 3 (three) times daily as needed.     levETIRAcetam (KEPPRA) 750 MG tablet Take 750 mg by mouth 2 (two) times daily.     metoprolol succinate (TOPROL XL) 25 MG 24 hr tablet Take 1 tablet (25 mg total) by mouth daily. 90 tablet 3   metoprolol tartrate (LOPRESSOR) 25 MG tablet Take 25 mg by mouth 2 (two) times daily.     Multiple Vitamins-Minerals (SUPER THERA VITE M) TABS Take 1 tablet by mouth daily.     nitroGLYCERIN (NITROSTAT) 0.4 MG SL tablet Place 1 tablet (0.4 mg total) under the tongue every 5 (five) minutes as needed for chest pain. 100 tablet 0    omeprazole (PRILOSEC) 40 MG capsule Take 40 mg by mouth 2 (two) times daily.     ondansetron (ZOFRAN-ODT) 4 MG disintegrating tablet Take 4 mg by mouth every 8 (eight) hours as needed for nausea or vomiting.     pantoprazole (PROTONIX) 40 MG tablet Take 1 tablet (40 mg total) by mouth 2 (two) times daily. 60 tablet 2   polyethylene glycol powder (GLYCOLAX/MIRALAX) 17 GM/SCOOP powder Take by mouth.     RESTASIS 0.05 % ophthalmic emulsion Place 1 drop into both eyes 2 (two) times daily.     Risankizumab-rzaa (SKYRIZI) 360 MG/2.4ML SOCT Inject 2.4 mLs into the skin See admin instructions. Every 8 weeks     SPIRIVA RESPIMAT 2.5 MCG/ACT AERS Inhale 1 puff into the lungs daily. 4 g 3   triazolam (HALCION) 0.25 MG tablet Take 0.25 mg by mouth daily.     Vitamin D, Ergocalciferol, (DRISDOL) 1.25 MG (50000 UNIT) CAPS capsule Take 50,000 Units by mouth once a week. Takes on Wednesdays.       Physical Exam: Blood pressure (!) 120/42, pulse (!) 125, temperature 98.6 F (37 C), temperature source Tympanic, resp. rate (!) 29, height 5' 2.99" (1.6 m), weight 66.3 kg, SpO2 95%. General: pleasant, WD white female who is laying in bed in NAD HEENT: head is normocephalic, atraumatic.  Sclera are noninjected.  PERRL.  Ears and nose without any masses or lesions.  Mouth is pink and dry Heart: regular rhythm but tachy in 130s.  Normal s1,s2. No obvious murmurs, gallops, or rubs noted.   Lungs: CTAB, no wheezes, rhonchi, or rales noted.  Respiratory effort nonlabored Abd: soft, diffusely tender, but greatest in LLQ and suprapubic region, mild distention, hypoactive BS, no masses, hernias, or organomegaly MS: all 4 extremities are symmetrical with no cyanosis, clubbing, or edema. Psych: A&O to self, place, and situation.  Knew it was October, but guess 2048 for the year.   Results for orders placed or performed  during the hospital encounter of 01/27/23 (from the past 48 hour(s))  Glucose, capillary     Status:  Abnormal   Collection Time: 02/08/23  8:16 PM  Result Value Ref Range   Glucose-Capillary 128 (H) 70 - 99 mg/dL    Comment: Glucose reference range applies only to samples taken after fasting for at least 8 hours.  Glucose, capillary     Status: Abnormal   Collection Time: 02/08/23 11:41 PM  Result Value Ref Range   Glucose-Capillary 208 (H) 70 - 99 mg/dL    Comment: Glucose reference range applies only to samples taken after fasting for at least 8 hours.  Glucose, capillary     Status: Abnormal   Collection Time: 02/09/23  4:01 AM  Result Value Ref Range   Glucose-Capillary 203 (H) 70 - 99 mg/dL    Comment: Glucose reference range applies only to samples taken after fasting for at least 8 hours.  Magnesium     Status: None   Collection Time: 02/09/23  4:30 AM  Result Value Ref Range   Magnesium 2.1 1.7 - 2.4 mg/dL    Comment: Performed at [facility], [address], [place], [place] [address]  Phosphorus     Status: None   Collection Time: 02/09/23  4:30 AM  Result Value Ref Range   Phosphorus 3.6 2.5 - 4.6 mg/dL    Comment: Performed at [facility], [address], [place], [place] [address]  CBC     Status: Abnormal   Collection Time: 02/09/23  4:30 AM  Result Value Ref Range   WBC 12.6 (H) 4.0 - 10.5 K/uL   RBC 3.70 (L) 3.87 - 5.11 MIL/uL   Hemoglobin 10.5 (L) 12.0 - 15.0 g/dL   HCT 64.4 (L) 03.4 - 74.2 %   MCV 87.8 80.0 - 100.0 fL   MCH 28.4 26.0 - 34.0 pg   MCHC 32.3 30.0 - 36.0 g/dL   RDW 59.5 63.8 - 75.6 %   Platelets 1,050 (HH) 150 - 400 K/uL    Comment: CRITICAL VALUE NOTED.  VALUE IS CONSISTENT WITH PREVIOUSLY REPORTED AND CALLED VALUE. REPEATED TO VERIFY    nRBC 0.2 0.0 - 0.2 %    Comment: Performed at [facility], [address] [place], [place] [address]  Basic metabolic panel     Status: Abnormal   Collection Time: 02/09/23  4:30 AM  Result Value Ref Range   Sodium 131 (L) 135 - 145 mmol/L   Potassium 4.7 3.5 - 5.1 mmol/L   Chloride  100 98 - 111 mmol/L   CO2 20 (L) 22 - 32 mmol/L   Glucose, Bld 217 (H) 70 - 99 mg/dL    Comment: Glucose reference range applies only to samples taken after fasting for at least 8 hours.   BUN 11 6 - 20 mg/dL   Creatinine, Ser 5.18 0.44 - 1.00 mg/dL   Calcium 8.7 (L) 8.9 - 10.3 mg/dL   GFR, Estimated >84 >16 mL/min    Comment: (NOTE) Calculated using the CKD-EPI Creatinine Equation (2021)    Anion gap 11 5 - 15    Comment: Performed at [facility], [address], [place], [place] [address]  Procalcitonin     Status: None   Collection Time: 02/09/23  4:30 AM  Result Value Ref Range   Procalcitonin 0.29 ng/mL    Comment:        Interpretation: PCT (Procalcitonin) <= 0.5 ng/mL: Systemic infection (  sepsis) is not likely. Local bacterial infection is possible. (NOTE)       Sepsis PCT Algorithm           Lower Respiratory Tract                                      Infection PCT Algorithm    ----------------------------     ----------------------------         PCT < 0.25 ng/mL                PCT < 0.10 ng/mL          Strongly encourage             Strongly discourage   discontinuation of antibiotics    initiation of antibiotics    ----------------------------     -----------------------------       PCT 0.25 - 0.50 ng/mL            PCT 0.10 - 0.25 ng/mL               OR       >80% decrease in PCT            Discourage initiation of                                            antibiotics      Encourage discontinuation           of antibiotics    ----------------------------     -----------------------------         PCT >= 0.50 ng/mL              PCT 0.26 - 0.50 ng/mL               AND        <80% decrease in PCT             Encourage initiation of                                             antibiotics       Encourage continuation           of antibiotics    ----------------------------     -----------------------------        PCT >= 0.50 ng/mL                  PCT > 0.50  ng/mL               AND         increase in PCT                  Strongly encourage                                      initiation of antibiotics    Strongly encourage escalation           of antibiotics                                     -----------------------------  PCT <= 0.25 ng/mL                                                 OR                                        > 80% decrease in PCT                                      Discontinue / Do not initiate                                             antibiotics  Performed at [facility], [address], [place], [place] [address]   Glucose, capillary     Status: Abnormal   Collection Time: 02/09/23  8:24 AM  Result Value Ref Range   Glucose-Capillary 192 (H) 70 - 99 mg/dL    Comment: Glucose reference range applies only to samples taken after fasting for at least 8 hours.  Glucose, capillary     Status: Abnormal   Collection Time: 02/09/23 12:19 PM  Result Value Ref Range   Glucose-Capillary 251 (H) 70 - 99 mg/dL    Comment: Glucose reference range applies only to samples taken after fasting for at least 8 hours.  Glucose, capillary     Status: None   Collection Time: 02/09/23  4:33 PM  Result Value Ref Range   Glucose-Capillary 72 70 - 99 mg/dL    Comment: Glucose reference range applies only to samples taken after fasting for at least 8 hours.  Glucose, capillary     Status: Abnormal   Collection Time: 02/09/23  8:13 PM  Result Value Ref Range   Glucose-Capillary 187 (H) 70 - 99 mg/dL    Comment: Glucose reference range applies only to samples taken after fasting for at least 8 hours.  Glucose, capillary     Status: Abnormal   Collection Time: 02/09/23 11:25 PM  Result Value Ref Range   Glucose-Capillary 175 (H) 70 - 99 mg/dL    Comment: Glucose reference range applies only to samples taken after fasting for at least 8 hours.  Glucose, capillary     Status: Abnormal    Collection Time: 02/10/23  4:26 AM  Result Value Ref Range   Glucose-Capillary 116 (H) 70 - 99 mg/dL    Comment: Glucose reference range applies only to samples taken after fasting for at least 8 hours.  Magnesium     Status: None   Collection Time: 02/10/23  6:07 AM  Result Value Ref Range   Magnesium 2.2 1.7 - 2.4 mg/dL    Comment: Performed at [facility], [address] [place], [place] [address]  Phosphorus     Status: Abnormal   Collection Time: 02/10/23  6:07 AM  Result Value Ref Range   Phosphorus 4.7 (H) 2.5 - 4.6 mg/dL    Comment: Performed at [facility], [address], [place], [place] [address]  CBC     Status: Abnormal  Collection Time: 02/10/23  6:07 AM  Result Value Ref Range   WBC 10.9 (H) 4.0 - 10.5 K/uL   RBC 3.93 3.87 - 5.11 MIL/uL   Hemoglobin 10.6 (L) 12.0 - 15.0 g/dL   HCT 06.3 (L) 01.6 - 01.0 %   MCV 85.2 80.0 - 100.0 fL   MCH 27.0 26.0 - 34.0 pg   MCHC 31.6 30.0 - 36.0 g/dL   RDW 93.2 35.5 - 73.2 %   Platelets 1,111 (HH) 150 - 400 K/uL    Comment: CRITICAL VALUE NOTED.  VALUE IS CONSISTENT WITH PREVIOUSLY REPORTED AND CALLED VALUE. REPEATED TO VERIFY    nRBC 0.0 0.0 - 0.2 %    Comment: Performed at [facility], [address], [place], [place] [address]  Basic metabolic panel     Status: Abnormal   Collection Time: 02/10/23  6:07 AM  Result Value Ref Range   Sodium 135 135 - 145 mmol/L   Potassium 4.6 3.5 - 5.1 mmol/L   Chloride 96 (L) 98 - 111 mmol/L   CO2 26 22 - 32 mmol/L   Glucose, Bld 134 (H) 70 - 99 mg/dL    Comment: Glucose reference range applies only to samples taken after fasting for at least 8 hours.   BUN 8 6 - 20 mg/dL   Creatinine, Ser 2.70 0.44 - 1.00 mg/dL   Calcium 9.3 8.9 - 62.3 mg/dL   GFR, Estimated >76 >28 mL/min    Comment: (NOTE) Calculated using the CKD-EPI Creatinine Equation (2021)    Anion gap 13 5 - 15    Comment: Performed at [facility], [address], [place], [place] [address]   Procalcitonin     Status: None   Collection Time: 02/10/23  6:07 AM  Result Value Ref Range   Procalcitonin 0.31 ng/mL    Comment:        Interpretation: PCT (Procalcitonin) <= 0.5 ng/mL: Systemic infection (sepsis) is not likely. Local bacterial infection is possible. (NOTE)       Sepsis PCT Algorithm           Lower Respiratory Tract                                      Infection PCT Algorithm    ----------------------------     ----------------------------         PCT < 0.25 ng/mL                PCT < 0.10 ng/mL          Strongly encourage             Strongly discourage   discontinuation of antibiotics    initiation of antibiotics    ----------------------------     -----------------------------       PCT 0.25 - 0.50 ng/mL            PCT 0.10 - 0.25 ng/mL               OR       >80% decrease in PCT            Discourage initiation of  antibiotics      Encourage discontinuation           of antibiotics    ----------------------------     -----------------------------         PCT >= 0.50 ng/mL              PCT 0.26 - 0.50 ng/mL               AND        <80% decrease in PCT             Encourage initiation of                                             antibiotics       Encourage continuation           of antibiotics    ----------------------------     -----------------------------        PCT >= 0.50 ng/mL                  PCT > 0.50 ng/mL               AND         increase in PCT                  Strongly encourage                                      initiation of antibiotics    Strongly encourage escalation           of antibiotics                                     -----------------------------                                           PCT <= 0.25 ng/mL                                                 OR                                        > 80% decrease in PCT                                      Discontinue / Do not  initiate                                             antibiotics  Performed at [facility], [address] [place], [place] [address]   Glucose, capillary     Status: Abnormal   Collection  Time: 02/10/23  8:00 AM  Result Value Ref Range   Glucose-Capillary 149 (H) 70 - 99 mg/dL    Comment: Glucose reference range applies only to samples taken after fasting for at least 8 hours.   Comment 1 QC Due    [place] EKG SITE RITE  Result Date: 02/10/2023 If Site Rite image not attached, placement could not be confirmed due to current cardiac rhythm.  DG Abd 2 Views  Result Date: 02/10/2023 CLINICAL DATA:  Ileus EXAM: ABDOMEN - 2 VIEW COMPARISON:  02/09/2023 FINDINGS: Supine and upright frontal views of the abdomen and pelvis are obtained. Enteric catheter tip projects over the proximal duodenum. Nonspecific gaseous distension of the large and small bowel, similar to prior study, consistent with given history of ileus. No masses or abnormal calcifications. No free gas in the greater peritoneal sac. Lung bases are clear. IMPRESSION: 1. Stable diffuse gaseous distension of the large and small bowel compatible with ileus. Electronically Signed   By: Sharlet Salina M.D.   On: 02/10/2023 09:09   DG Abd 2 Views  Result Date: 02/09/2023 CLINICAL DATA:  Ileus.  No abdominal complaints. EXAM: ABDOMEN - 2 VIEW COMPARISON:  Abdominal x-ray from yesterday. FINDINGS: Unchanged feeding tube with tip in the proximal duodenal. Similar mild gaseous distention of small and large bowel. No intraperitoneal free air. No acute osseous abnormality. IMPRESSION: 1. Similar mild ileus. Electronically Signed   By: Obie Dredge M.D.   On: 02/09/2023 10:43      Assessment/Plan Pseudomembranous colitis secondary to c diff/crohn's disease/ischemia The patient has been seen, examined, labs, chart, vitals, imaging, and procedure reports.  The patient appears to have fairly significant pseudomembranous colitis with  multiple factors.  Her flex sig images have been reviewed and show significant disease.  She is somewhat diffusely tender on exam, no peritonitis, but greatest in the LLQ and suprapubic region c/w findings on her flex sig.  She is tachycardic, but has been this way it appears throughout her entire hospital stay thus far.  She is hemodynamically stable currently, although, she does appear to be worsening on her flex sig and refractory to current therapies.  She very likely will need a total abdominal colectomy with end ileostomy.  I will discuss this with Dr. Luisa Hart and have him review her information to confirm this.  Recommend hold TFs and give nothing else nutritionally, orally, to minimize further pain, etc.  Continue with currently abx regimen.  I have discussed this patient with Dr. Jerral Ralph of the primary team.  Once we make more decisions, we will need to discuss this with her daughter who is her current decision at this time.   FEN - TFs on hold, IVFs per primary VTE - heparin ID - Flagyl/Vanco  DM HTN S/p cardiac arrest with ROSC after 5 minutes Seizure disorder COPD/tobacco abuse Encephalopathy since admission  Sinus tachycardia - per primary, had had echo, CTA chest, TSH, all stable, cards has seen and signed off Thrombocytosis   I reviewed Consultant GI notes, hospitalist notes, last 24 h vitals and pain scores, last 48 h intake and output, last 24 h labs and trends, and last 24 h imaging results.  Letha Cape, [facility] 02/10/2023, 4:07 PM Please see Amion for pager number during day hours 7:00am-4:30pm or 7:00am -11:30am on weekends

## 2023-02-11 ENCOUNTER — Encounter (HOSPITAL_COMMUNITY): Payer: Self-pay | Admitting: Pulmonary Disease

## 2023-02-11 ENCOUNTER — Other Ambulatory Visit: Payer: Self-pay

## 2023-02-11 ENCOUNTER — Encounter (HOSPITAL_COMMUNITY)
Admission: EM | Disposition: A | Discharge status: Home Health | Payer: Self-pay | Source: Home / Self Care | Attending: Student

## 2023-02-11 ENCOUNTER — Inpatient Hospital Stay (HOSPITAL_COMMUNITY): Payer: Medicaid Other

## 2023-02-11 DIAGNOSIS — A0472 Enterocolitis due to Clostridium difficile, not specified as recurrent: Secondary | ICD-10-CM

## 2023-02-11 DIAGNOSIS — I10 Essential (primary) hypertension: Secondary | ICD-10-CM | POA: Diagnosis not present

## 2023-02-11 DIAGNOSIS — I469 Cardiac arrest, cause unspecified: Secondary | ICD-10-CM | POA: Diagnosis not present

## 2023-02-11 DIAGNOSIS — J9601 Acute respiratory failure with hypoxia: Secondary | ICD-10-CM | POA: Diagnosis not present

## 2023-02-11 DIAGNOSIS — G9341 Metabolic encephalopathy: Secondary | ICD-10-CM | POA: Diagnosis not present

## 2023-02-11 HISTORY — PX: LAPAROTOMY: SHX154

## 2023-02-11 HISTORY — PX: COLOSTOMY: SHX63

## 2023-02-11 HISTORY — PX: COLECTOMY: SHX59

## 2023-02-11 LAB — PHOSPHORUS: Phosphorus: 4.6 mg/dL (ref 2.5–4.6)

## 2023-02-11 LAB — GLUCOSE, CAPILLARY
Glucose-Capillary: 102 mg/dL — ABNORMAL HIGH (ref 70–99)
Glucose-Capillary: 101 mg/dL — ABNORMAL HIGH (ref 70–99)
Glucose-Capillary: 103 mg/dL — ABNORMAL HIGH (ref 70–99)
Glucose-Capillary: 184 mg/dL — ABNORMAL HIGH (ref 70–99)
Glucose-Capillary: 203 mg/dL — ABNORMAL HIGH (ref 70–99)
Glucose-Capillary: 61 mg/dL — ABNORMAL LOW (ref 70–99)

## 2023-02-11 LAB — CBC
HCT: 37.3 % (ref 36.0–46.0)
Hemoglobin: 12 g/dL (ref 12.0–15.0)
MCH: 27.8 pg (ref 26.0–34.0)
MCHC: 32.2 g/dL (ref 30.0–36.0)
MCV: 86.3 fL (ref 80.0–100.0)
Platelets: 968 10*3/uL (ref 150–400)
RBC: 4.32 MIL/uL (ref 3.87–5.11)
RDW: 15 % (ref 11.5–15.5)
WBC: 10.2 10*3/uL (ref 4.0–10.5)
nRBC: 0.3 % — ABNORMAL HIGH (ref 0.0–0.2)

## 2023-02-11 LAB — COMPREHENSIVE METABOLIC PANEL
ALT: 14 U/L (ref 0–44)
AST: 20 U/L (ref 15–41)
Albumin: 2.5 g/dL — ABNORMAL LOW (ref 3.5–5.0)
Alkaline Phosphatase: 97 U/L (ref 38–126)
Anion gap: 12 (ref 5–15)
BUN: 8 mg/dL (ref 6–20)
CO2: 22 mmol/L (ref 22–32)
Calcium: 8.8 mg/dL — ABNORMAL LOW (ref 8.9–10.3)
Chloride: 100 mmol/L (ref 98–111)
Creatinine, Ser: 0.71 mg/dL (ref 0.44–1.00)
GFR, Estimated: >60 mL/min (ref >60)
Glucose, Bld: 100 mg/dL — ABNORMAL HIGH (ref 70–99)
Potassium: 5.6 mmol/L — ABNORMAL HIGH (ref 3.5–5.1)
Sodium: 134 mmol/L — ABNORMAL LOW (ref 135–145)
Total Bilirubin: 0.7 mg/dL (ref 0.3–1.2)
Total Protein: 6.4 g/dL — ABNORMAL LOW (ref 6.5–8.1)

## 2023-02-11 LAB — PREPARE RBC (CROSSMATCH)

## 2023-02-11 LAB — MAGNESIUM: Magnesium: 2 mg/dL (ref 1.7–2.4)

## 2023-02-11 SURGERY — LAPAROTOMY, EXPLORATORY
Anesthesia: General

## 2023-02-11 MED ORDER — METOPROLOL TARTRATE 25 MG PO TABS: 25.0000 mg | ORAL_TABLET | Freq: Two times a day (BID) | ORAL | Status: DC

## 2023-02-11 MED ORDER — ORAL CARE MOUTH RINSE: 15.0000 mL | Freq: Once | OROMUCOSAL | Status: AC

## 2023-02-11 MED ORDER — TRAMADOL HCL 50 MG PO TABS: 50.0000 mg | ORAL_TABLET | Freq: Four times a day (QID) | ORAL | Status: DC | PRN

## 2023-02-11 MED ORDER — FENTANYL CITRATE (PF) 250 MCG/5ML IJ SOLN
INTRAMUSCULAR | Status: AC
  Filled 2023-02-11: qty 5

## 2023-02-11 MED ORDER — LIDOCAINE 2% (20 MG/ML) 5 ML SYRINGE
INTRAMUSCULAR | Status: DC | PRN
  Administered 2023-02-11: 60 mg via INTRAVENOUS

## 2023-02-11 MED ORDER — FENTANYL CITRATE (PF) 100 MCG/2ML IJ SOLN
INTRAMUSCULAR | Status: AC
  Filled 2023-02-11: qty 2

## 2023-02-11 MED ORDER — PHENYLEPHRINE 80 MCG/ML (10ML) SYRINGE FOR IV PUSH (FOR BLOOD PRESSURE SUPPORT)
PREFILLED_SYRINGE | INTRAVENOUS | Status: DC | PRN
  Administered 2023-02-11 (×2): 160 ug via INTRAVENOUS
  Administered 2023-02-11 (×2): 200 ug via INTRAVENOUS
  Administered 2023-02-11: 260 ug via INTRAVENOUS
  Administered 2023-02-11: 200 ug via INTRAVENOUS

## 2023-02-11 MED ORDER — MIDAZOLAM HCL 2 MG/2ML IJ SOLN
INTRAMUSCULAR | Status: AC
  Filled 2023-02-11: qty 2

## 2023-02-11 MED ORDER — LIDOCAINE 5 % EX PTCH
2.0000 | MEDICATED_PATCH | CUTANEOUS | Status: DC
  Administered 2023-02-11 – 2023-02-12 (×2): 2 via TRANSDERMAL
  Filled 2023-02-11 (×2): qty 2

## 2023-02-11 MED ORDER — ROCURONIUM BROMIDE 10 MG/ML (PF) SYRINGE
PREFILLED_SYRINGE | INTRAVENOUS | Status: DC | PRN
  Administered 2023-02-11: 40 mg via INTRAVENOUS
  Administered 2023-02-11: 60 mg via INTRAVENOUS

## 2023-02-11 MED ORDER — PROMETHAZINE HCL 25 MG/ML IJ SOLN
6.2500 mg | Freq: Once | INTRAMUSCULAR | Status: AC
  Administered 2023-02-11: 6.25 mg via INTRAVENOUS

## 2023-02-11 MED ORDER — ALBUMIN HUMAN 5 % IV SOLN
INTRAVENOUS | Status: DC | PRN
Start: 2023-02-11 — End: 2023-02-11

## 2023-02-11 MED ORDER — METHOCARBAMOL 1000 MG/10ML IJ SOLN
1000.0000 mg | Freq: Four times a day (QID) | INTRAVENOUS | Status: DC | PRN
  Administered 2023-02-11: 1000 mg via INTRAVENOUS
  Filled 2023-02-11 (×2): qty 10

## 2023-02-11 MED ORDER — METOPROLOL SUCCINATE ER 50 MG PO TB24: 50.0000 mg | ORAL_TABLET | Freq: Every evening | ORAL | Status: DC

## 2023-02-11 MED ORDER — FENTANYL CITRATE (PF) 100 MCG/2ML IJ SOLN
25.0000 ug | INTRAMUSCULAR | Status: DC | PRN
  Administered 2023-02-11: 25 ug via INTRAVENOUS

## 2023-02-11 MED ORDER — FENTANYL CITRATE (PF) 250 MCG/5ML IJ SOLN
INTRAMUSCULAR | Status: DC | PRN
  Administered 2023-02-11: 50 ug via INTRAVENOUS

## 2023-02-11 MED ORDER — HEPARIN SODIUM (PORCINE) 5000 UNIT/ML IJ SOLN
5000.0000 [IU] | Freq: Three times a day (TID) | INTRAMUSCULAR | Status: DC
  Administered 2023-02-12 – 2023-02-16 (×13): 5000 [IU] via SUBCUTANEOUS
  Filled 2023-02-11 (×13): qty 1

## 2023-02-11 MED ORDER — PROMETHAZINE HCL 25 MG/ML IJ SOLN
INTRAMUSCULAR | Status: AC
  Filled 2023-02-11: qty 1

## 2023-02-11 MED ORDER — SUGAMMADEX SODIUM 200 MG/2ML IV SOLN
INTRAVENOUS | Status: DC | PRN
  Administered 2023-02-11: 200 mg via INTRAVENOUS

## 2023-02-11 MED ORDER — METOPROLOL TARTRATE 5 MG/5ML IV SOLN
2.5000 mg | INTRAVENOUS | Status: AC
  Administered 2023-02-11: 2.5 mg via INTRAVENOUS
  Filled 2023-02-11: qty 5

## 2023-02-11 MED ORDER — MIDAZOLAM HCL 2 MG/2ML IJ SOLN
INTRAMUSCULAR | Status: DC | PRN
  Administered 2023-02-11: 2 mg via INTRAVENOUS

## 2023-02-11 MED ORDER — PROPOFOL 10 MG/ML IV BOLUS
INTRAVENOUS | Status: DC | PRN
  Administered 2023-02-11: 100 mg via INTRAVENOUS

## 2023-02-11 MED ORDER — SODIUM ZIRCONIUM CYCLOSILICATE 10 G PO PACK
10.0000 g | PACK | Freq: Every day | ORAL | Status: AC
  Administered 2023-02-11: 10 g via ORAL
  Filled 2023-02-11: qty 1

## 2023-02-11 MED ORDER — INSULIN ASPART 100 UNIT/ML IJ SOLN: 0.0000 [IU] | INTRAMUSCULAR | Status: DC | PRN

## 2023-02-11 MED ORDER — PHENYLEPHRINE HCL-NACL 20-0.9 MG/250ML-% IV SOLN
INTRAVENOUS | Status: DC | PRN
  Administered 2023-02-11: 40 ug/min via INTRAVENOUS

## 2023-02-11 MED ORDER — SUCCINYLCHOLINE CHLORIDE 200 MG/10ML IV SOSY
PREFILLED_SYRINGE | INTRAVENOUS | Status: DC | PRN
  Administered 2023-02-11: 80 mg via INTRAVENOUS

## 2023-02-11 MED ORDER — SODIUM CHLORIDE 0.9 % IV SOLN
2.0000 g | INTRAVENOUS | Status: DC
  Administered 2023-02-11 – 2023-02-27 (×17): 2 g via INTRAVENOUS
  Filled 2023-02-11 (×17): qty 20

## 2023-02-11 MED ORDER — ONDANSETRON HCL 4 MG/2ML IJ SOLN
INTRAMUSCULAR | Status: DC | PRN
  Administered 2023-02-11: 4 mg via INTRAVENOUS

## 2023-02-11 MED ORDER — VASOPRESSIN 20 UNIT/ML IV SOLN
INTRAVENOUS | Status: DC | PRN
Start: 2023-02-11 — End: 2023-02-11
  Administered 2023-02-11: 2 [IU] via INTRAVENOUS
  Administered 2023-02-11 (×5): 3 [IU] via INTRAVENOUS

## 2023-02-11 MED ORDER — CHLORHEXIDINE GLUCONATE 0.12 % MT SOLN
15.0000 mL | Freq: Once | OROMUCOSAL | Status: AC
  Administered 2023-02-11: 15 mL via OROMUCOSAL
  Filled 2023-02-11: qty 15

## 2023-02-11 MED ORDER — DEXTROSE 50 % IV SOLN
INTRAVENOUS | Status: AC
  Administered 2023-02-11: 25 mL
  Filled 2023-02-11: qty 50

## 2023-02-11 MED ORDER — DEXAMETHASONE SODIUM PHOSPHATE 10 MG/ML IJ SOLN
INTRAMUSCULAR | Status: DC | PRN
  Administered 2023-02-11: 4 mg via INTRAVENOUS

## 2023-02-11 MED ORDER — LACTATED RINGERS IV BOLUS
500.0000 mL | Freq: Once | INTRAVENOUS | Status: AC
  Administered 2023-02-11: 500 mL via INTRAVENOUS

## 2023-02-11 MED ORDER — PROPOFOL 10 MG/ML IV BOLUS
INTRAVENOUS | Status: AC
  Filled 2023-02-11: qty 20

## 2023-02-11 SURGICAL SUPPLY — 65 items
APL PRP STRL LF DISP 70% ISPRP (MISCELLANEOUS) ×2
APPLIER CLIP 9.375 MED OPEN (MISCELLANEOUS) ×2
APR CLP MED 9.3 20 MLT OPN (MISCELLANEOUS) ×2
BAG COUNTER SPONGE SURGICOUNT (BAG) ×2 IMPLANT
BAG SPNG CNTER NS LX DISP (BAG) ×2
BLADE CLIPPER SURG (BLADE) IMPLANT
CANISTER SUCT 3000ML PPV (MISCELLANEOUS) ×2 IMPLANT
CHLORAPREP W/TINT 26 (MISCELLANEOUS) ×2 IMPLANT
CLIP APPLIE 9.375 MED OPEN (MISCELLANEOUS) IMPLANT
COVER MAYO STAND STRL (DRAPES) ×4 IMPLANT
COVER SURGICAL LIGHT HANDLE (MISCELLANEOUS) ×2 IMPLANT
DRAPE HALF SHEET 40X57 (DRAPES) ×4 IMPLANT
DRAPE LAPAROSCOPIC ABDOMINAL (DRAPES) ×2 IMPLANT
DRAPE UTILITY XL STRL (DRAPES) ×2 IMPLANT
DRAPE WARM FLUID 44X44 (DRAPES) ×2 IMPLANT
DRSG OPSITE POSTOP 4X10 (GAUZE/BANDAGES/DRESSINGS) IMPLANT
DRSG OPSITE POSTOP 4X8 (GAUZE/BANDAGES/DRESSINGS) IMPLANT
DRSG VAC GRANUFOAM LG (GAUZE/BANDAGES/DRESSINGS) IMPLANT
ELECT BLADE 6.5 EXT (BLADE) ×2 IMPLANT
ELECT CAUTERY BLADE 6.4 (BLADE) ×2 IMPLANT
ELECT REM PT RETURN 9FT ADLT (ELECTROSURGICAL) ×2
ELECTRODE REM PT RTRN 9FT ADLT (ELECTROSURGICAL) ×2 IMPLANT
GLOVE BIO SURGEON STRL SZ8 (GLOVE) ×4 IMPLANT
GLOVE BIOGEL PI IND STRL 8 (GLOVE) ×4 IMPLANT
GOWN STRL REUS W/ TWL LRG LVL3 (GOWN DISPOSABLE) ×8 IMPLANT
GOWN STRL REUS W/ TWL XL LVL3 (GOWN DISPOSABLE) ×4 IMPLANT
GOWN STRL REUS W/TWL LRG LVL3 (GOWN DISPOSABLE) ×8
GOWN STRL REUS W/TWL XL LVL3 (GOWN DISPOSABLE) ×4
HANDLE SUCTION POOLE (INSTRUMENTS) ×2 IMPLANT
KIT BASIN OR (CUSTOM PROCEDURE TRAY) ×2 IMPLANT
KIT COLOSTOMY ILEOSTOMY 2.75 (WOUND CARE) IMPLANT
KIT SIGMOIDOSCOPE (SET/KITS/TRAYS/PACK) IMPLANT
KIT TURNOVER KIT B (KITS) ×2 IMPLANT
LEGGING LITHOTOMY PAIR STRL (DRAPES) ×2 IMPLANT
LIGASURE IMPACT 36 18CM CVD LR (INSTRUMENTS) ×2 IMPLANT
NS IRRIG 1000ML POUR BTL (IV SOLUTION) ×4 IMPLANT
PACK GENERAL/GYN (CUSTOM PROCEDURE TRAY) ×2 IMPLANT
PAD ARMBOARD 7.5X6 YLW CONV (MISCELLANEOUS) ×4 IMPLANT
PENCIL SMOKE EVACUATOR (MISCELLANEOUS) ×2 IMPLANT
SPECIMEN JAR LARGE (MISCELLANEOUS) IMPLANT
SPECIMEN JAR X LARGE (MISCELLANEOUS) ×2 IMPLANT
SPONGE T-LAP 18X18 ~~LOC~~+RFID (SPONGE) IMPLANT
STAPLER PROXIMATE 75MM BLUE (STAPLE) IMPLANT
STAPLER VISISTAT 35W (STAPLE) ×2 IMPLANT
SUCTION POOLE HANDLE (INSTRUMENTS) ×2
SURGILUBE 2OZ TUBE FLIPTOP (MISCELLANEOUS) IMPLANT
SUT PDS AB 1 TP1 96 (SUTURE) ×4 IMPLANT
SUT PROLENE 2 0 CT2 30 (SUTURE) IMPLANT
SUT PROLENE 2 0 KS (SUTURE) IMPLANT
SUT SILK 2 0 SH CR/8 (SUTURE) ×2 IMPLANT
SUT SILK 2 0 TIES 10X30 (SUTURE) ×2 IMPLANT
SUT SILK 3 0 SH CR/8 (SUTURE) ×2 IMPLANT
SUT SILK 3 0 TIES 10X30 (SUTURE) ×2 IMPLANT
SUT VIC AB 2-0 SH 18 (SUTURE) ×2 IMPLANT
SUT VIC AB 3-0 SH 18 (SUTURE) ×2 IMPLANT
SUT VICRYL AB 2 0 TIES (SUTURE) ×2 IMPLANT
SUT VICRYL AB 3 0 TIES (SUTURE) ×2 IMPLANT
SYR BULB IRRIG 60ML STRL (SYRINGE) ×2 IMPLANT
TOWEL GREEN STERILE (TOWEL DISPOSABLE) ×4 IMPLANT
TOWEL GREEN STERILE FF (TOWEL DISPOSABLE) ×2 IMPLANT
TRAY FOLEY MTR SLVR 14FR STAT (SET/KITS/TRAYS/PACK) ×2 IMPLANT
TRAY FOLEY MTR SLVR 16FR STAT (SET/KITS/TRAYS/PACK) IMPLANT
TUBE CONNECTING 12X1/4 (SUCTIONS) ×2 IMPLANT
UNDERPAD 30X36 HEAVY ABSORB (UNDERPADS AND DIAPERS) IMPLANT
YANKAUER SUCT BULB TIP NO VENT (SUCTIONS) ×2 IMPLANT

## 2023-02-11 NOTE — Op Note (Addendum)
Preoperative diagnosis: Fulminant C. difficile colitis with failure of medical management  Postoperative diagnosis: Same with frank necrosis of the proximal rectum and sigmoid colon with significant colitis involving the remainder with perforation of proximal rectum and large pelvic abscess  Procedure: Total abdominal colectomy with ileostomy  Surgeon: Harriette Bouillon, MD  Anesthesia: General  EBL: 700 cc  IV fluids 1 L crystalloid and 500 cc NS pain  Drain: 19 round  Specimen: Total abdominal: And proximal rectum  Indications for procedure: The patient is a 52 year old chronically ill female who has had C. difficile colitis for last 2 weeks.  She has been made to the hospital after being found down.  She underwent 5 minutes of CPR to resuscitate her.  Unfortunately, she has had failure to thrive and has had C. difficile colitis.  She has been managed medically and has had progression of her disease.  She has not had any systemic signs of organ failure but concern was that she was progressing with more pain and a rising leukocytosis as well as being managed maximally medically.  She was seen in consultation.  We discussed on table lavage versus subtotal colectomy depending on findings.  I felt the subtotal colectomy will be her best option given her overwhelming immunocompromise state and severe protein calorie malnutrition as well as declining condition.  I described ileostomy to her as well.  Risks and benefits of surgery reviewed as well as risk of bleeding, infection, death, DVT, wound complications, hernia,, injury to internal organs, the need for additional procedures and intra-abdominal abscess.   Description of procedure: The patient was met in the holding area questions were answered.  She is taken back to the operating placed upon upon the operating table.  After induction of general anesthesia, Foley catheter was placed.  Her abdomen was prepped and draped in sterile fashion and  timeout performed.  Midline incision was used.  This was from just in the mid upper abdomen in the midline down toward the pubis.  Dissection was carried through the subcutaneous fat to the linea alba was identified and opened the midline.  We entered the cavity.  On inspection there was matted bowel in the pelvis.  This was densely adherent to the pelvic sidewalls.  Retractor was placed.  Upon examination the descending colon, sigmoid colon and proximal rectum were all liquefied and were dead.  A large abscess had been walled out by her terminal ileum in this area as well.  We used blunt dissection to break up the loculations.  The entire colonic wall involving the distal descending, sigmoid and proximal rectum were frankly necrotic.  Once I was able to fracture off the small bowel we mobilized the left colon along the white line of Toldt the best we could.  There was dense intraabdominal swelling of the retroperitoneal space.  I was able to get my hand in the retroperitoneal plane and bluntly mobilized was left of the descending colon.  The ureters identified and preserved.  We did this all way up to the splenic flexure was mobilized away as well using the LigaSure.  The transverse colon was also dissected off the omentum.  LigaSure was used with that.  Once hepatic flexure was in a fight it was mobilized down the right gutter and mobilized the white line of Toldt all the way down to the cecum.  The terminal ileum was necrotic as well from the abscess and I found an area proximal to that and divided that with a  GIA 75 stapler.  We did use a LigaSure to take the mesentery circumferentially from the right colon, transverse colon and descending colon.  The specimen was passed off the field.  I then had debride the proximal rectum out of the pelvis since it was necrotic and took it down to the mid rectum.  This was viable.  I oversewed this with a pursestring suture of 2-0 Prolene.  Irrigation was then used and  copious liters were suctioned out until clear.  I reexamined the retroperitoneum identify the ureter and it was preserved on left side.  The small bowel was run to the ligament of Treitz.  There was 1 small serosal tear oversewed with 2-0 Vicryl.  Hemostasis was achieved.  Through a left lower quadrant stab incision 19 round drain was placed in the pelvis.  We then closed the fascia with double-stranded PDS.  Skin was left open and wound VAC applied.  A left lower quadrant incision was used for the ileostomy.  The ileum was pulled through this with Babcocks.  It was secured to the skin with 3-0 Vicryl circumferentially.  This was pink and viable.  Ostomy appliance applied.  All final counts of sponges, needles and instruments found to be correct.  The patient was extubated taken recovery in satisfactory condition.

## 2023-02-11 NOTE — Progress Notes (Signed)
Patient to OR at this time

## 2023-02-11 NOTE — Progress Notes (Signed)
Nutrition Follow-up  DOCUMENTATION CODES:   Not applicable  INTERVENTION:   Recommend TPN if unable to resume TF within next 24-48 hours.  When able to resume TF via Cortrak, recommend:  Vital AF 1.2 at 20 ml/h, increase by 10 ml every 4 hours to goal rate of 60 ml/h. Vital AF 1.2 is a peptide based formula that can be beneficial for patients with impaired GI function, maldigestion, malabsorption.  Provides 1728 kcal, 108 gm protein, 1168 ml free water daily.  NUTRITION DIAGNOSIS:   Inadequate oral intake related to inability to eat as evidenced by NPO status.  Ongoing   GOAL:   Patient will meet greater than or equal to 90% of their needs  Unmet   MONITOR:   Diet advancement, Skin, Labs, Weight trends, TF tolerance  REASON FOR ASSESSMENT:   Consult Assessment of nutrition requirement/status  ASSESSMENT:   52 year old female with past medical history of COPD, Crohn's on Skyrizi, hypertension, type 2 diabetes mellitus who initially presented to the after being found unresponsive.  Patient has been receiving TF via Cortrak tube up until it was stopped on 10/10 to minimize further abdominal pain. She was also on full liquids with minimal PO intake d/t pain and poor appetite. Currently NPO.   S/P flex sigmoidoscopy 10/10, found to have worsening severe pseudomembranous colitis d/t C diff, Crohn's disease, and ischemia. Plans for subtotal colectomy with ileostomy today.  Labs reviewed. Na 134, K 5.6 CBG: 102-101  Medications reviewed and include novoLOG SSI, Semglee, Protonix, IV Flagyl. IVF: NS at 75 ml/h  Fecal management system was removed 10/8. Patient had 850 ml of stool output documented x 24 hours 10/10.   Diet Order:   Diet Order             Diet NPO time specified Except for: Ice Chips, Sips with Meds  Diet effective midnight                   EDUCATION NEEDS:   Not appropriate for education at this time  Skin:  Skin Assessment: Reviewed RN  Assessment  Last BM:  10/7  Height:   Ht Readings from Last 1 Encounters:  02/02/23 5' 2.99" (1.6 m)    Weight:   Wt Readings from Last 1 Encounters:  02/11/23 61.2 kg    Ideal Body Weight:  52.3 kg  BMI:  Body mass index is 23.91 kg/m.  Estimated Nutritional Needs:   Kcal:  1700-1900 kcal/d  Protein:  85-100 g/d  Fluid:  >1.8L/d   [name] RD, LDN, CNSC Please refer to Amion for contact information.

## 2023-02-11 NOTE — Progress Notes (Addendum)
PROGRESS NOTE        PATIENT DETAILS Name: [name] Age: 52 y.o. Sex: female Date of Birth: 02/28/1971 Admit Date: 01/27/2023 Admitting Physician Lynnell Catalan, MD ZOX:[id number], Marcelino Duster, PA-C  Brief Summary: Patient is a 52 y.o.  female with history of Crohn's disease-on Skyrizi, HTN,DM-2, COPD-who had diarrhea at home-refused to seek medical attention-she was found unresponsive by family-EMS was called-she was found to be lying in a large pool of bloody emesis +/- diarrhea-SBP in the 60s-minimally responsive.  She was brought to the ED-where she was unresponsive with agonal breathing and pulseless-CPR was initiated for approximately 5 minutes-with ROSC.  She was intubated-and admitted to the ICU.  Upon further evaluation with a flexible sigmoidoscopy-she was found to have changes consistent with pseudomembranous colitis.  She was stabilized and transferred to [facility] on 10/4.  Unfortunately-further hospital course complicated by persistent sinus tachycardia-worsening colitis with ileus.  Repeat sigmoidoscopy on 10/10 showed severe inflammation involving the proximal rectum/sigmoid colon-in spite of maximal medical treatment.  Due to clinical worsening-and risk for ischemic bowel-General Surgery evaluated the patient-and plans are for subtotal colectomy on 10/10.  Significant events: 9/26>> admit to ICU-intubated/pressors 9/29>> off pressors 10/2>> extubated-flexible sigmoidoscopy 10/4>> transferred to [facility] 10/10>> flexible sigmoidoscopy-diffuse severe inflammation-surgical consult  Significant studies: 9/26>> CXR: No pneumonia 9/26>> x-ray abdomen: Mildly dilated small bowel loops 9/26>> CT head: No acute abnormalities 9/26>> CT abdomen/pelvis: Infectious or inflammatory proctocolitis 9/30>> CT abdomen/pelvis: Fused colitis/proctitis-small bowel enteritis 10/7>> CTA chest: No PE 10/7>> bilateral lower extremity Doppler: No DVT 10/7>> echo: EF> 75% 10/8>> x-ray  abdomen: Mild to moderate bowel distention suggestive of ileus. 10/10>> x-ray abdomen: Stable ileus  Significant microbiology data: 9/26>> COVID/influenza/RSV PCR: Negative 9/26>> blood: Staph hominis (contamination) 9/29>> stool GI pathogen panel: Negative 9/29>> stool C. difficile: Antigen +ve-toxin -ve  Procedures: 10/2>> flexible sigmoidoscopy: Severe mucosal changes in the rectum-consistent with Crohn's disease-and as well as pseudomembranes-consistent with C. difficile colitis 10/10>> flexible sigmoidoscopy: Deferred severe inflammation in the rectum/sigmoid colon  Consults: PCCM Cardiology GI General surgery  Subjective: Continues to have diarrhea-no other complaints.  Objective: Vitals: Blood pressure (!) 107/53, pulse (!) 120, temperature 99.4 F (37.4 C), temperature source Axillary, resp. rate 20, height 5' 2.99" (1.6 m), weight 61.2 kg, SpO2 93%.   Exam: Gen Exam:Alert awake-not in any distress HEENT:atraumatic, normocephalic Chest: B/L clear to auscultation anteriorly CVS:S1S2 regular Abdomen:soft non tender, non distended Extremities:no edema Neurology: Non focal Skin: no rash  Pertinent Labs/Radiology:    Latest Ref Rng & Units 02/11/2023    4:27 AM 02/10/2023    6:07 AM 02/09/2023    4:30 AM  CBC  WBC 4.0 - 10.5 K/uL 10.2  10.9  12.6   Hemoglobin 12.0 - 15.0 g/dL 40.9  81.1  91.4   Hematocrit 36.0 - 46.0 % 37.3  33.5  32.5   Platelets 150 - 400 K/uL 968  1,111  1,050     Lab Results  Component Value Date   NA 134 (L) 02/11/2023   K 5.6 (H) 02/11/2023   CL 100 02/11/2023   CO2 22 02/11/2023      Assessment/Plan: Septic shock secondary to fulminant Clostridium difficile infection Sepsis physiology improved Did require pressors on initial presentation Cultures negative  Cardiac arrest Due to septic shock This was on initial presentation-briefly required CPR Currently completely awake/alert Echo stable CT angiogram chest-no PE  Telemetry  without any arrhythmias  Acute metabolic encephalopathy Secondary septic shock Mentation improved-awake/alert this morning.  Fulminant C. difficile colitis with ileus Unfortunately continues to have diarrhea-significant-severe inflammation seen on colonoscopy on 10/10 in spite of maximal therapy for C. difficile.  Although the main culprit is thought to be C. difficile infection-it is also thought that ongoing Crohn's colitis and probably ischemic colitis (hypotensive on initial presentation) were also significant causative agents.  Since continues to worsen in spite of supportive care and maximal medical therapy for C. difficile-at risk for necrotic bowel-evaluated by general surgery and plans are for a subtotal colectomy later today. In the interim-remains on oral vancomycin-IV Flagyl was added on 10/10.  History of Crohn's disease Recently started on Skyrizi-second dose on 9/13 See above regarding concerns that Crohn's disease may also be playing a role in her worsening colitis.  Sinus tachycardia Felt to be secondary to physiological stressors Echo/CTA chest/TSH all stable On metoprolol Cardiology followed but no further recommendation anticipation is that heart rate will improve as a physiological stresses get better.  Moderate pericardial effusion-no tamponade Per cardiology-likely due to critical illness-plans are to follow in the outpatient setting  QTc prolongation Resolved as of 10/7 EKG Keep K> 4, Mg> 2 Telemetry monitoring  Hyperkalemia Mild Lokelma x 1 dose Repeat electrolytes in a.m..  Oropharyngeal dysphagia Secondary to encephalopathy/debility/deconditioning Cortrak tube in place-feedings held for subtotal colectomy.    Normocytic anemia Due to critical illness Follow CBC  Thrombocytosis Likely due to acute illness Follow CBC  COPD Not in exacerbation-bronchodilators  Seizure disorder Stable Keppra  Anxiety disorder As needed Xanax  DM-2 (A1c 5.8  on 9/26) CBG stable Semglee 5 units twice daily+ SSI  Recent Labs    02/10/23 2340 02/11/23 0342 02/11/23 1023  GLUCAP 115* 102* 101*    Debility/deconditioning Secondary to acute illness PT/OT-SNF recommended  Nutrition Status: Nutrition Problem: Inadequate oral intake Etiology: inability to eat Signs/Symptoms: NPO status Interventions: Refer to RD note for recommendations  BMI: Estimated body mass index is 23.91 kg/m as calculated from the following:   Height as of this encounter: 5' 2.99" (1.6 m).   Weight as of this encounter: 61.2 kg.   Code status:   Code Status: Full Code   DVT Prophylaxis: heparin injection 5,000 Units Start: 01/30/23 0830 SCDs Start: 01/27/23 1111   Family Communication: Daughter-Amber [phone]-updated over the phone on 10/10   Disposition Plan: Status is: Inpatient Remains inpatient appropriate because: Severity of illness   Planned Discharge Destination:Skilled nursing facility   Diet: Diet Order             Diet NPO time specified Except for: Ice Chips, Sips with Meds  Diet effective midnight                     Antimicrobial agents: Anti-infectives (From admission, onward)    Start     Dose/Rate Route Frequency Ordered Stop   02/10/23 1600  metroNIDAZOLE (FLAGYL) IVPB 500 mg        500 mg 100 mL/hr over 60 Minutes Intravenous Every 12 hours 02/10/23 1444     02/08/23 1800  vancomycin (VANCOCIN) 50 mg/mL oral solution SOLN 500 mg        500 mg Per Tube Every 6 hours 02/08/23 1325 02/12/23 1759   02/03/23 2200  metroNIDAZOLE (FLAGYL) tablet 500 mg        500 mg Per Tube Every 12 hours 02/03/23 1406 02/06/23 2148   02/02/23 1815  vancomycin (  VANCOCIN) 50 mg/mL oral solution SOLN 125 mg  Status:  Discontinued        125 mg Per Tube Every 6 hours 02/02/23 1717 02/08/23 1325   02/02/23 1545  vancomycin (VANCOCIN) capsule 125 mg  Status:  Discontinued        125 mg Oral 4 times daily 02/02/23 1530 02/02/23 1717    01/27/23 1430  cefTRIAXone (ROCEPHIN) 2 g in sodium chloride 0.9 % 100 mL IVPB        2 g 200 mL/hr over 30 Minutes Intravenous Every 24 hours 01/27/23 1340 02/05/23 1403   01/27/23 1400  metroNIDAZOLE (FLAGYL) IVPB 500 mg  Status:  Discontinued        500 mg 100 mL/hr over 60 Minutes Intravenous Every 12 hours 01/27/23 1340 02/03/23 1406   01/27/23 0545  vancomycin (VANCOREADY) IVPB 1250 mg/250 mL        1,250 mg 166.7 mL/hr over 90 Minutes Intravenous  Once 01/27/23 0542 01/27/23 0809   01/27/23 0545  ceFEPIme (MAXIPIME) 2 g in sodium chloride 0.9 % 100 mL IVPB        2 g 200 mL/hr over 30 Minutes Intravenous  Once 01/27/23 0542 01/27/23 0621   01/27/23 0545  metroNIDAZOLE (FLAGYL) IVPB 500 mg        500 mg 100 mL/hr over 60 Minutes Intravenous  Once 01/27/23 0542 01/27/23 0741        MEDICATIONS: Scheduled Meds:  Chlorhexidine Gluconate Cloth  6 each Topical Daily   Gerhardt's butt cream   Topical BID   heparin injection (subcutaneous)  5,000 Units Subcutaneous Q8H   insulin aspart  0-9 Units Subcutaneous Q4H   insulin glargine-yfgn  5 Units Subcutaneous BID   metoprolol succinate  50 mg Oral QPM   mouth rinse  15 mL Mouth Rinse 4 times per day   pantoprazole (PROTONIX) IV  40 mg Intravenous Q24H   vancomycin  500 mg Per Tube Q6H   Continuous Infusions:  sodium chloride 0 mL/hr at 02/02/23 0725   sodium chloride 75 mL/hr at 02/11/23 0634   levETIRAcetam 750 mg (02/11/23 1018)   metronidazole Stopped (02/11/23 0602)   PRN Meds:.sodium chloride, acetaminophen, albuterol, ALPRAZolam, magic mouthwash, ondansetron (ZOFRAN) IV   I have personally reviewed following labs and imaging studies  LABORATORY DATA: CBC: Recent Labs  Lab 02/08/23 0352 02/08/23 0727 02/09/23 0430 02/10/23 0607 02/11/23 0427  WBC 13.1* 12.6* 12.6* 10.9* 10.2  HGB 10.4* 11.1* 10.5* 10.6* 12.0  HCT 32.5* 34.6* 32.5* 33.5* 37.3  MCV 86.0 88.3 87.8 85.2 86.3  PLT 1,031* 806* 1,050* 1,111* 968*     Basic Metabolic Panel: Recent Labs  Lab 02/07/23 0746 02/08/23 0352 02/09/23 0430 02/10/23 0607 02/11/23 0427  NA 132* 135 131* 135 134*  K 4.0 4.3 4.7 4.6 5.6*  CL 101 103 100 96* 100  CO2 24 21* 20* 26 22  GLUCOSE 136* 156* 217* 134* 100*  BUN 6 6 11 8 8   CREATININE 0.47 0.62 0.55 0.51 0.71  CALCIUM 8.4* 8.6* 8.7* 9.3 8.8*  MG 1.8 2.2 2.1 2.2 2.0  PHOS 3.4 3.6 3.6 4.7* 4.6    GFR: Estimated Creatinine Clearance: 67.3 mL/min (by C-G formula based on SCr of 0.71 mg/dL).  Liver Function Tests: Recent Labs  Lab 02/11/23 0427  AST 20  ALT 14  ALKPHOS 97  BILITOT 0.7  PROT 6.4*  ALBUMIN 2.5*   No results for input(s): "LIPASE", "AMYLASE" in the last 168 hours. No results for input(s): "  AMMONIA" in the last 168 hours.  Coagulation Profile: No results for input(s): "INR", "PROTIME" in the last 168 hours.  Cardiac Enzymes: No results for input(s): "CKTOTAL", "CKMB", "CKMBINDEX", "TROPONINI" in the last 168 hours.  BNP (last 3 results) No results for input(s): "PROBNP" in the last 8760 hours.  Lipid Profile: No results for input(s): "CHOL", "HDL", "LDLCALC", "TRIG", "CHOLHDL", "LDLDIRECT" in the last 72 hours.  Thyroid Function Tests: No results for input(s): "TSH", "T4TOTAL", "FREET4", "T3FREE", "THYROIDAB" in the last 72 hours.   Anemia Panel: No results for input(s): "VITAMINB12", "FOLATE", "FERRITIN", "TIBC", "IRON", "RETICCTPCT" in the last 72 hours.  Urine analysis:    Component Value Date/Time   COLORURINE AMBER (A) 01/27/2023 0514   APPEARANCEUR HAZY (A) 01/27/2023 0514   LABSPEC 1.020 01/27/2023 0514   PHURINE 5.0 01/27/2023 0514   GLUCOSEU NEGATIVE 01/27/2023 0514   GLUCOSEU NEG mg/dL 69/62/9528 4132   HGBUR NEGATIVE 01/27/2023 0514   BILIRUBINUR NEGATIVE 01/27/2023 0514   KETONESUR 5 (A) 01/27/2023 0514   PROTEINUR 30 (A) 01/27/2023 0514   UROBILINOGEN 0.2 12/26/2013 1530   NITRITE NEGATIVE 01/27/2023 0514   LEUKOCYTESUR TRACE (A)  01/27/2023 0514    Sepsis Labs: Lactic Acid, Venous    Component Value Date/Time   LATICACIDVEN 1.0 01/29/2023 0816    MICROBIOLOGY: No results found for this or any previous visit (from the past 240 hour(s)).  RADIOLOGY STUDIES/RESULTS: [place] EKG SITE RITE  Result Date: 02/10/2023 If Site Rite image not attached, placement could not be confirmed due to current cardiac rhythm.  DG Abd 2 Views  Result Date: 02/10/2023 CLINICAL DATA:  Ileus EXAM: ABDOMEN - 2 VIEW COMPARISON:  02/09/2023 FINDINGS: Supine and upright frontal views of the abdomen and pelvis are obtained. Enteric catheter tip projects over the proximal duodenum. Nonspecific gaseous distension of the large and small bowel, similar to prior study, consistent with given history of ileus. No masses or abnormal calcifications. No free gas in the greater peritoneal sac. Lung bases are clear. IMPRESSION: 1. Stable diffuse gaseous distension of the large and small bowel compatible with ileus. Electronically Signed   By: Sharlet Salina M.D.   On: 02/10/2023 09:09     LOS: 15 days   Jeoffrey Massed, MD  [facility]    To contact the attending provider between 7A-7P or the covering provider during after hours 7P-7A, please log into the web site [email or web address] and access using universal [place] password for that web site. If you do not have the password, please call the hospital operator.  02/11/2023, 11:03 AM

## 2023-02-11 NOTE — Interval H&P Note (Signed)
History and Physical Interval Note:  02/11/2023 12:46 PM  [name]  has presented today for surgery, with the diagnosis of C- DIFF  COLITIS.  The various methods of treatment have been discussed with the patient and family. After consideration of risks, benefits and other options for treatment, the patient has consented to  Procedure(s): EXPLORATORY LAPAROTOMY (N/A) TOTAL COLECTOMY (N/A) as a surgical intervention.  The patient's history has been reviewed, patient examined, no change in status, stable for surgery.  I have reviewed the patient's chart and labs.  Questions were answered to the patient's satisfaction.     Susano Cleckler A Chauncey Bruno

## 2023-02-11 NOTE — Progress Notes (Signed)
Spoke with [facility], daughter for PICC placement via telephone consent. States she prefers to speak with the MD before placement. RN and MD made aware. Will F/U.

## 2023-02-11 NOTE — Plan of Care (Signed)

## 2023-02-11 NOTE — Anesthesia Preprocedure Evaluation (Addendum)
Anesthesia Evaluation  Patient identified by MRN, date of birth, ID band Patient awake    Reviewed: Allergy & Precautions, H&P , NPO status , Patient's Chart, lab work & pertinent test results, reviewed documented beta blocker date and time   Airway Mallampati: II  TM Distance: >3 FB Neck ROM: Full    Dental no notable dental hx. (+) Teeth Intact, Dental Advisory Given   Pulmonary asthma , COPD,  COPD inhaler, Current Smoker   Pulmonary exam normal breath sounds clear to auscultation       Cardiovascular hypertension, Pt. on medications and Pt. on home beta blockers + DOE   Rhythm:Regular Rate:Normal     Neuro/Psych Seizures -, Well Controlled,   Anxiety Depression       GI/Hepatic Neg liver ROS,GERD  Medicated,,  Endo/Other  diabetes, Insulin Dependent    Renal/GU negative Renal ROS  negative genitourinary   Musculoskeletal  (+) Arthritis , Osteoarthritis,    Abdominal   Peds  Hematology negative hematology ROS (+)   Anesthesia Other Findings   Reproductive/Obstetrics negative OB ROS                             Anesthesia Physical Anesthesia Plan  ASA: 3  Anesthesia Plan: General   Post-op Pain Management: Ofirmev IV (intra-op)*   Induction: Intravenous  PONV Risk Score and Plan: 3 and Ondansetron, Dexamethasone and Midazolam  Airway Management Planned: Oral ETT  Additional Equipment:   Intra-op Plan:   Post-operative Plan: Extubation in OR  Informed Consent: I have reviewed the patients History and Physical, chart, labs and discussed the procedure including the risks, benefits and alternatives for the proposed anesthesia with the patient or authorized representative who has indicated his/her understanding and acceptance.     Dental advisory given  Plan Discussed with: CRNA  Anesthesia Plan Comments:        Anesthesia Quick Evaluation

## 2023-02-11 NOTE — Progress Notes (Signed)
Per Dr. Jerral Ralph, PICC order to be cancelled at this time.

## 2023-02-11 NOTE — H&P (View-Only) (Signed)
1 Day Post-Op   Subjective/Chief Complaint: Patient without complaint  Discussed subtotal colectomy today with her and she is in agreement to proceed.  I called her daughter as well to explain situation and options of subtotal colectomy versus intraoperative colonic lavage versus continuing medical management.  I am afraid she is failed to progress on nonoperative measures.  I also explained the benefit of intraoperative lavage and reviewed the literature about that.  I think in her situation, this may be less effective due to overall debilitated state and history of inflammatory bowel disease.  Recommend subtotal colectomy end ileostomy.  Daughter understands and agrees to proceed.   Objective: Vital signs in last 24 hours: Temp:  [97 F (36.1 C)-98.6 F (37 C)] 97.9 F (36.6 C) (10/11 0400) Pulse Rate:  [116-138] 120 (10/11 0415) Resp:  [14-29] 20 (10/11 0415) BP: (92-131)/(39-97) 107/53 (10/11 0415) SpO2:  [93 %-100 %] 93 % (10/11 0415) Weight:  [61.2 kg] 61.2 kg (10/11 0500) Last BM Date : 02/10/23  Intake/Output from previous day: 10/10 0701 - 10/11 0700 In: 4822.6 [P.O.:60; I.V.:1055.6; NG/GT:3399; IV Piggyback:308] Out: 0  Intake/Output this shift: No intake/output data recorded.  Abdomen: Diffusely tender without peritonitis  Lab Results:  Recent Labs    02/10/23 0607 02/11/23 0427  WBC 10.9* 10.2  HGB 10.6* 12.0  HCT 33.5* 37.3  PLT 1,111* 968*   BMET Recent Labs    02/10/23 0607 02/11/23 0427  NA 135 134*  K 4.6 5.6*  CL 96* 100  CO2 26 22  GLUCOSE 134* 100*  BUN 8 8  CREATININE 0.51 0.71  CALCIUM 9.3 8.8*   PT/INR No results for input(s): "LABPROT", "INR" in the last 72 hours. ABG No results for input(s): "PHART", "HCO3" in the last 72 hours.  Invalid input(s): "PCO2", "PO2"  Studies/Results: [place] EKG SITE RITE  Result Date: 02/10/2023 If Site Rite image not attached, placement could not be confirmed due to current cardiac rhythm.  DG Abd  2 Views  Result Date: 02/10/2023 CLINICAL DATA:  Ileus EXAM: ABDOMEN - 2 VIEW COMPARISON:  02/09/2023 FINDINGS: Supine and upright frontal views of the abdomen and pelvis are obtained. Enteric catheter tip projects over the proximal duodenum. Nonspecific gaseous distension of the large and small bowel, similar to prior study, consistent with given history of ileus. No masses or abnormal calcifications. No free gas in the greater peritoneal sac. Lung bases are clear. IMPRESSION: 1. Stable diffuse gaseous distension of the large and small bowel compatible with ileus. Electronically Signed   By: Sharlet Salina M.D.   On: 02/10/2023 09:09    Anti-infectives: Anti-infectives (From admission, onward)    Start     Dose/Rate Route Frequency Ordered Stop   02/10/23 1600  metroNIDAZOLE (FLAGYL) IVPB 500 mg        500 mg 100 mL/hr over 60 Minutes Intravenous Every 12 hours 02/10/23 1444     02/08/23 1800  vancomycin (VANCOCIN) 50 mg/mL oral solution SOLN 500 mg        500 mg Per Tube Every 6 hours 02/08/23 1325 02/12/23 1759   02/03/23 2200  metroNIDAZOLE (FLAGYL) tablet 500 mg        500 mg Per Tube Every 12 hours 02/03/23 1406 02/06/23 2148   02/02/23 1815  vancomycin (VANCOCIN) 50 mg/mL oral solution SOLN 125 mg  Status:  Discontinued        125 mg Per Tube Every 6 hours 02/02/23 1717 02/08/23 1325   02/02/23 1545  vancomycin (VANCOCIN)  capsule 125 mg  Status:  Discontinued        125 mg Oral 4 times daily 02/02/23 1530 02/02/23 1717   01/27/23 1430  cefTRIAXone (ROCEPHIN) 2 g in sodium chloride 0.9 % 100 mL IVPB        2 g 200 mL/hr over 30 Minutes Intravenous Every 24 hours 01/27/23 1340 02/05/23 1403   01/27/23 1400  metroNIDAZOLE (FLAGYL) IVPB 500 mg  Status:  Discontinued        500 mg 100 mL/hr over 60 Minutes Intravenous Every 12 hours 01/27/23 1340 02/03/23 1406   01/27/23 0545  vancomycin (VANCOREADY) IVPB 1250 mg/250 mL        1,250 mg 166.7 mL/hr over 90 Minutes Intravenous  Once  01/27/23 0542 01/27/23 0809   01/27/23 0545  ceFEPIme (MAXIPIME) 2 g in sodium chloride 0.9 % 100 mL IVPB        2 g 200 mL/hr over 30 Minutes Intravenous  Once 01/27/23 0542 01/27/23 0621   01/27/23 0545  metroNIDAZOLE (FLAGYL) IVPB 500 mg        500 mg 100 mL/hr over 60 Minutes Intravenous  Once 01/27/23 0542 01/27/23 0741       Assessment/Plan: Pseudomembranous colitis secondary to c diff/crohn's disease/ischemia    Patient has failed medical management.  Recommend proceeding subtotal colectomy ileostomy.  Risks and benefits reviewed with the patient and daughter today.  Risk of bleeding, infection, wound complications, death, DVT, exacerbation of underlying medical problems, need for additional procedures, also discussed reversal at a later time when she is stable.  Contrasted this with medical management as well as on table lavage.  Pros cons and expected outcomes reviewed with all 3 measures.  They have agreed to proceed with subtotal colectomy with ileostomy.   LOS: 15 days    Clovis Pu Shaylen Nephew 02/11/2023 High complexity

## 2023-02-11 NOTE — Progress Notes (Signed)
1 Day Post-Op   Subjective/Chief Complaint: Patient without complaint  Discussed subtotal colectomy today with her and she is in agreement to proceed.  I called her daughter as well to explain situation and options of subtotal colectomy versus intraoperative colonic lavage versus continuing medical management.  I am afraid she is failed to progress on nonoperative measures.  I also explained the benefit of intraoperative lavage and reviewed the literature about that.  I think in her situation, this may be less effective due to overall debilitated state and history of inflammatory bowel disease.  Recommend subtotal colectomy end ileostomy.  Daughter understands and agrees to proceed.   Objective: Vital signs in last 24 hours: Temp:  [97 F (36.1 C)-98.6 F (37 C)] 97.9 F (36.6 C) (10/11 0400) Pulse Rate:  [116-138] 120 (10/11 0415) Resp:  [14-29] 20 (10/11 0415) BP: (92-131)/(39-97) 107/53 (10/11 0415) SpO2:  [93 %-100 %] 93 % (10/11 0415) Weight:  [61.2 kg] 61.2 kg (10/11 0500) Last BM Date : 02/10/23  Intake/Output from previous day: 10/10 0701 - 10/11 0700 In: 4822.6 [P.O.:60; I.V.:1055.6; NG/GT:3399; IV Piggyback:308] Out: 0  Intake/Output this shift: No intake/output data recorded.  Abdomen: Diffusely tender without peritonitis  Lab Results:  Recent Labs    02/10/23 0607 02/11/23 0427  WBC 10.9* 10.2  HGB 10.6* 12.0  HCT 33.5* 37.3  PLT 1,111* 968*   BMET Recent Labs    02/10/23 0607 02/11/23 0427  NA 135 134*  K 4.6 5.6*  CL 96* 100  CO2 26 22  GLUCOSE 134* 100*  BUN 8 8  CREATININE 0.51 0.71  CALCIUM 9.3 8.8*   PT/INR No results for input(s): "LABPROT", "INR" in the last 72 hours. ABG No results for input(s): "PHART", "HCO3" in the last 72 hours.  Invalid input(s): "PCO2", "PO2"  Studies/Results: [place] EKG SITE RITE  Result Date: 02/10/2023 If Site Rite image not attached, placement could not be confirmed due to current cardiac rhythm.  DG Abd  2 Views  Result Date: 02/10/2023 CLINICAL DATA:  Ileus EXAM: ABDOMEN - 2 VIEW COMPARISON:  02/09/2023 FINDINGS: Supine and upright frontal views of the abdomen and pelvis are obtained. Enteric catheter tip projects over the proximal duodenum. Nonspecific gaseous distension of the large and small bowel, similar to prior study, consistent with given history of ileus. No masses or abnormal calcifications. No free gas in the greater peritoneal sac. Lung bases are clear. IMPRESSION: 1. Stable diffuse gaseous distension of the large and small bowel compatible with ileus. Electronically Signed   By: Sharlet Salina M.D.   On: 02/10/2023 09:09    Anti-infectives: Anti-infectives (From admission, onward)    Start     Dose/Rate Route Frequency Ordered Stop   02/10/23 1600  metroNIDAZOLE (FLAGYL) IVPB 500 mg        500 mg 100 mL/hr over 60 Minutes Intravenous Every 12 hours 02/10/23 1444     02/08/23 1800  vancomycin (VANCOCIN) 50 mg/mL oral solution SOLN 500 mg        500 mg Per Tube Every 6 hours 02/08/23 1325 02/12/23 1759   02/03/23 2200  metroNIDAZOLE (FLAGYL) tablet 500 mg        500 mg Per Tube Every 12 hours 02/03/23 1406 02/06/23 2148   02/02/23 1815  vancomycin (VANCOCIN) 50 mg/mL oral solution SOLN 125 mg  Status:  Discontinued        125 mg Per Tube Every 6 hours 02/02/23 1717 02/08/23 1325   02/02/23 1545  vancomycin (VANCOCIN)  capsule 125 mg  Status:  Discontinued        125 mg Oral 4 times daily 02/02/23 1530 02/02/23 1717   01/27/23 1430  cefTRIAXone (ROCEPHIN) 2 g in sodium chloride 0.9 % 100 mL IVPB        2 g 200 mL/hr over 30 Minutes Intravenous Every 24 hours 01/27/23 1340 02/05/23 1403   01/27/23 1400  metroNIDAZOLE (FLAGYL) IVPB 500 mg  Status:  Discontinued        500 mg 100 mL/hr over 60 Minutes Intravenous Every 12 hours 01/27/23 1340 02/03/23 1406   01/27/23 0545  vancomycin (VANCOREADY) IVPB 1250 mg/250 mL        1,250 mg 166.7 mL/hr over 90 Minutes Intravenous  Once  01/27/23 0542 01/27/23 0809   01/27/23 0545  ceFEPIme (MAXIPIME) 2 g in sodium chloride 0.9 % 100 mL IVPB        2 g 200 mL/hr over 30 Minutes Intravenous  Once 01/27/23 0542 01/27/23 0621   01/27/23 0545  metroNIDAZOLE (FLAGYL) IVPB 500 mg        500 mg 100 mL/hr over 60 Minutes Intravenous  Once 01/27/23 0542 01/27/23 0741       Assessment/Plan: Pseudomembranous colitis secondary to c diff/crohn's disease/ischemia    Patient has failed medical management.  Recommend proceeding subtotal colectomy ileostomy.  Risks and benefits reviewed with the patient and daughter today.  Risk of bleeding, infection, wound complications, death, DVT, exacerbation of underlying medical problems, need for additional procedures, also discussed reversal at a later time when she is stable.  Contrasted this with medical management as well as on table lavage.  Pros cons and expected outcomes reviewed with all 3 measures.  They have agreed to proceed with subtotal colectomy with ileostomy.   LOS: 15 days    Clovis Pu Shaylen Nephew 02/11/2023 High complexity

## 2023-02-11 NOTE — Anesthesia Procedure Notes (Signed)
Procedure Name: Intubation Date/Time: 02/11/2023 2:09 PM  Performed by: Sandie Ano, CRNAPre-anesthesia Checklist: Patient identified, Emergency Drugs available, Suction available and Patient being monitored Patient Re-evaluated:Patient Re-evaluated prior to induction Oxygen Delivery Method: Circle System Utilized Preoxygenation: Pre-oxygenation with 100% oxygen Induction Type: IV induction Ventilation: Mask ventilation without difficulty Laryngoscope Size: Mac and 3 Grade View: Grade II Tube type: Oral Tube size: 7.0 mm Number of attempts: 1 Airway Equipment and Method: Stylet and Oral airway Placement Confirmation: ETT inserted through vocal cords under direct vision, positive ETCO2 and breath sounds checked- equal and bilateral Secured at: 23 cm Tube secured with: Tape Dental Injury: Teeth and Oropharynx as per pre-operative assessment

## 2023-02-11 NOTE — Transfer of Care (Signed)
Immediate Anesthesia Transfer of Care Note  Patient: [name]  Procedure(s) Performed: EXPLORATORY LAPAROTOMY TOTAL COLECTOMY COLOSTOMY  Patient Location: PACU  Anesthesia Type:General  Level of Consciousness: awake  Airway & Oxygen Therapy: Patient Spontanous Breathing and Patient connected to face mask oxygen  Post-op Assessment: Report given to RN and Post -op Vital signs reviewed and stable  Post vital signs: Reviewed and stable  Last Vitals:  Vitals Value Taken Time  BP 114/32 02/11/23 1647  Temp 36.8 C 02/11/23 1647  Pulse 124 02/11/23 1654  Resp 35 02/11/23 1654  SpO2 100 % 02/11/23 1654  Vitals shown include unfiled device data.  Last Pain:  Vitals:   02/11/23 1238  TempSrc: Oral  PainSc: 10-Worst pain ever      Patients Stated Pain Goal: 0 (02/08/23 1300)  Complications: No notable events documented.

## 2023-02-12 DIAGNOSIS — I469 Cardiac arrest, cause unspecified: Secondary | ICD-10-CM | POA: Diagnosis not present

## 2023-02-12 LAB — POCT I-STAT 7, (LYTES, BLD GAS, ICA,H+H)
Acid-base deficit: 7 mmol/L — ABNORMAL HIGH (ref 0.0–2.0)
Bicarbonate: 18.7 mmol/L — ABNORMAL LOW (ref 20.0–28.0)
Calcium, Ion: 1.1 mmol/L — ABNORMAL LOW (ref 1.15–1.40)
HCT: 23 % — ABNORMAL LOW (ref 36.0–46.0)
Hemoglobin: 7.8 g/dL — ABNORMAL LOW (ref 12.0–15.0)
O2 Saturation: 99 %
Potassium: 5.6 mmol/L — ABNORMAL HIGH (ref 3.5–5.1)
Sodium: 133 mmol/L — ABNORMAL LOW (ref 135–145)
TCO2: 20 mmol/L — ABNORMAL LOW (ref 22–32)
pCO2 arterial: 36.9 mm[Hg] (ref 32–48)
pH, Arterial: 7.314 — ABNORMAL LOW (ref 7.35–7.45)
pO2, Arterial: 130 mm[Hg] — ABNORMAL HIGH (ref 83–108)

## 2023-02-12 LAB — BPAM RBC
Blood Product Expiration Date: 202411022359
Blood Product Expiration Date: 202411092359
ISSUE DATE / TIME: 202410111638
ISSUE DATE / TIME: 202410111638
Unit Type and Rh: 7300
Unit Type and Rh: 7300

## 2023-02-12 LAB — GLUCOSE, CAPILLARY
Glucose-Capillary: 121 mg/dL — ABNORMAL HIGH (ref 70–99)
Glucose-Capillary: 135 mg/dL — ABNORMAL HIGH (ref 70–99)
Glucose-Capillary: 138 mg/dL — ABNORMAL HIGH (ref 70–99)
Glucose-Capillary: 140 mg/dL — ABNORMAL HIGH (ref 70–99)
Glucose-Capillary: 158 mg/dL — ABNORMAL HIGH (ref 70–99)
Glucose-Capillary: 180 mg/dL — ABNORMAL HIGH (ref 70–99)
Glucose-Capillary: 186 mg/dL — ABNORMAL HIGH (ref 70–99)

## 2023-02-12 LAB — COMPREHENSIVE METABOLIC PANEL
ALT: 13 U/L (ref 0–44)
ALT: 12 U/L (ref 0–44)
AST: 20 U/L (ref 15–41)
AST: 17 U/L (ref 15–41)
Albumin: 2.4 g/dL — ABNORMAL LOW (ref 3.5–5.0)
Albumin: 2.2 g/dL — ABNORMAL LOW (ref 3.5–5.0)
Alkaline Phosphatase: 61 U/L (ref 38–126)
Alkaline Phosphatase: 55 U/L (ref 38–126)
Anion gap: 12 (ref 5–15)
Anion gap: 12 (ref 5–15)
BUN: 5 mg/dL — ABNORMAL LOW (ref 6–20)
BUN: 7 mg/dL (ref 6–20)
CO2: 19 mmol/L — ABNORMAL LOW (ref 22–32)
CO2: 20 mmol/L — ABNORMAL LOW (ref 22–32)
Calcium: 7.9 mg/dL — ABNORMAL LOW (ref 8.9–10.3)
Calcium: 8 mg/dL — ABNORMAL LOW (ref 8.9–10.3)
Chloride: 104 mmol/L (ref 98–111)
Chloride: 102 mmol/L (ref 98–111)
Creatinine, Ser: 0.68 mg/dL (ref 0.44–1.00)
Creatinine, Ser: 0.69 mg/dL (ref 0.44–1.00)
GFR, Estimated: >60 mL/min (ref >60)
GFR, Estimated: >60 mL/min (ref >60)
Glucose, Bld: 124 mg/dL — ABNORMAL HIGH (ref 70–99)
Glucose, Bld: 166 mg/dL — ABNORMAL HIGH (ref 70–99)
Potassium: 4.4 mmol/L (ref 3.5–5.1)
Potassium: 4.5 mmol/L (ref 3.5–5.1)
Sodium: 135 mmol/L (ref 135–145)
Sodium: 134 mmol/L — ABNORMAL LOW (ref 135–145)
Total Bilirubin: 0.6 mg/dL (ref 0.3–1.2)
Total Bilirubin: 0.7 mg/dL (ref 0.3–1.2)
Total Protein: 5 g/dL — ABNORMAL LOW (ref 6.5–8.1)
Total Protein: 4.8 g/dL — ABNORMAL LOW (ref 6.5–8.1)

## 2023-02-12 LAB — POCT I-STAT, CHEM 8
BUN: 8 mg/dL (ref 6–20)
Calcium, Ion: 1.14 mmol/L — ABNORMAL LOW (ref 1.15–1.40)
Chloride: 101 mmol/L (ref 98–111)
Creatinine, Ser: 0.7 mg/dL (ref 0.44–1.00)
Glucose, Bld: 150 mg/dL — ABNORMAL HIGH (ref 70–99)
HCT: 28 % — ABNORMAL LOW (ref 36.0–46.0)
Hemoglobin: 9.5 g/dL — ABNORMAL LOW (ref 12.0–15.0)
Potassium: 5 mmol/L (ref 3.5–5.1)
Sodium: 134 mmol/L — ABNORMAL LOW (ref 135–145)
TCO2: 24 mmol/L (ref 22–32)

## 2023-02-12 LAB — CBC
HCT: 37.5 % (ref 36.0–46.0)
HCT: 36.2 % (ref 36.0–46.0)
Hemoglobin: 12.7 g/dL (ref 12.0–15.0)
Hemoglobin: 12 g/dL (ref 12.0–15.0)
MCH: 28.2 pg (ref 26.0–34.0)
MCH: 27.2 pg (ref 26.0–34.0)
MCHC: 33.9 g/dL (ref 30.0–36.0)
MCHC: 33.1 g/dL (ref 30.0–36.0)
MCV: 83.1 fL (ref 80.0–100.0)
MCV: 82.1 fL (ref 80.0–100.0)
Platelets: 750 10*3/uL — ABNORMAL HIGH (ref 150–400)
Platelets: 741 10*3/uL — ABNORMAL HIGH (ref 150–400)
RBC: 4.51 MIL/uL (ref 3.87–5.11)
RBC: 4.41 MIL/uL (ref 3.87–5.11)
RDW: 14.8 % (ref 11.5–15.5)
RDW: 15 % (ref 11.5–15.5)
WBC: 22.8 10*3/uL — ABNORMAL HIGH (ref 4.0–10.5)
WBC: 26.7 10*3/uL — ABNORMAL HIGH (ref 4.0–10.5)
nRBC: 0.4 % — ABNORMAL HIGH (ref 0.0–0.2)
nRBC: 0.4 % — ABNORMAL HIGH (ref 0.0–0.2)

## 2023-02-12 LAB — TYPE AND SCREEN
ABO/RH(D): B POS
Antibody Screen: NEGATIVE
Unit division: 0
Unit division: 0

## 2023-02-12 LAB — TROPONIN I (HIGH SENSITIVITY): Troponin I (High Sensitivity): 8 ng/L (ref <18)

## 2023-02-12 LAB — LACTIC ACID, PLASMA: Lactic Acid, Venous: 1.9 mmol/L (ref 0.5–1.9)

## 2023-02-12 MED ORDER — DEXTROSE 5 % IV SOLN: INTRAVENOUS | Status: AC

## 2023-02-12 MED ORDER — ACETAMINOPHEN 10 MG/ML IV SOLN
1000.0000 mg | INTRAVENOUS | Status: AC
  Administered 2023-02-12: 1000 mg via INTRAVENOUS
  Filled 2023-02-12: qty 100

## 2023-02-12 MED ORDER — LACTATED RINGERS IV BOLUS: 500.0000 mL | Freq: Once | INTRAVENOUS | Status: DC

## 2023-02-12 MED ORDER — ACETAMINOPHEN 10 MG/ML IV SOLN: 1000.0000 mg | Freq: Three times a day (TID) | INTRAVENOUS | Status: DC | PRN

## 2023-02-12 MED ORDER — METHOCARBAMOL 1000 MG/10ML IJ SOLN
1000.0000 mg | Freq: Three times a day (TID) | INTRAVENOUS | Status: DC
  Administered 2023-02-12 – 2023-02-13 (×4): 1000 mg via INTRAVENOUS
  Filled 2023-02-12 (×2): qty 1000
  Filled 2023-02-12: qty 10
  Filled 2023-02-12: qty 1000
  Filled 2023-02-12 (×3): qty 10

## 2023-02-12 MED ORDER — METOPROLOL TARTRATE 5 MG/5ML IV SOLN
5.0000 mg | INTRAVENOUS | Status: DC | PRN
  Administered 2023-02-12 – 2023-02-24 (×10): 5 mg via INTRAVENOUS
  Filled 2023-02-12 (×10): qty 5

## 2023-02-12 MED ORDER — NALOXONE HCL 0.4 MG/ML IJ SOLN
0.4000 mg | INTRAMUSCULAR | Status: DC | PRN
  Filled 2023-02-12: qty 1

## 2023-02-12 MED ORDER — METOPROLOL TARTRATE 5 MG/5ML IV SOLN
2.5000 mg | INTRAVENOUS | Status: AC | PRN
  Administered 2023-02-12 (×2): 2.5 mg via INTRAVENOUS
  Filled 2023-02-12 (×2): qty 5

## 2023-02-12 MED ORDER — SODIUM CHLORIDE 0.9% FLUSH: 9.0000 mL | INTRAVENOUS | Status: DC | PRN

## 2023-02-12 MED ORDER — LACTATED RINGERS IV BOLUS
1000.0000 mL | INTRAVENOUS | Status: AC
  Administered 2023-02-12: 1000 mL via INTRAVENOUS

## 2023-02-12 MED ORDER — LEVALBUTEROL HCL 0.63 MG/3ML IN NEBU: 0.6300 mg | INHALATION_SOLUTION | Freq: Three times a day (TID) | RESPIRATORY_TRACT | Status: DC | PRN

## 2023-02-12 MED ORDER — FENTANYL 50 MCG/ML IV PCA SOLN
INTRAVENOUS | Status: DC
  Administered 2023-02-12: 50 mL via INTRAVENOUS
  Filled 2023-02-12: qty 25

## 2023-02-12 MED ORDER — ACETAMINOPHEN 10 MG/ML IV SOLN
1000.0000 mg | Freq: Four times a day (QID) | INTRAVENOUS | Status: AC
  Administered 2023-02-12 – 2023-02-13 (×4): 1000 mg via INTRAVENOUS
  Filled 2023-02-12 (×4): qty 100

## 2023-02-12 MED ORDER — ALBUMIN HUMAN 25 % IV SOLN
50.0000 g | INTRAVENOUS | Status: AC
  Administered 2023-02-12: 50 g via INTRAVENOUS
  Filled 2023-02-12: qty 200

## 2023-02-12 MED ORDER — LORAZEPAM 2 MG/ML IJ SOLN
2.0000 mg | INTRAMUSCULAR | Status: DC | PRN
  Administered 2023-02-18: 2 mg via INTRAVENOUS
  Filled 2023-02-12: qty 1

## 2023-02-12 MED ORDER — ACETAMINOPHEN 325 MG PO TABS: 650.0000 mg | ORAL_TABLET | Freq: Four times a day (QID) | ORAL | Status: DC | PRN

## 2023-02-12 MED ORDER — FENTANYL CITRATE PF 50 MCG/ML IJ SOSY
12.5000 ug | PREFILLED_SYRINGE | INTRAMUSCULAR | Status: DC | PRN
  Administered 2023-02-12 (×3): 12.5 ug via INTRAVENOUS
  Filled 2023-02-12 (×3): qty 1

## 2023-02-12 MED ORDER — DIPHENHYDRAMINE HCL 12.5 MG/5ML PO ELIX: 12.5000 mg | ORAL_SOLUTION | Freq: Four times a day (QID) | ORAL | Status: DC | PRN

## 2023-02-12 MED ORDER — DEXTROSE IN LACTATED RINGERS 5 % IV SOLN: INTRAVENOUS | Status: DC

## 2023-02-12 MED ORDER — ONDANSETRON HCL 4 MG/2ML IJ SOLN: 4.0000 mg | Freq: Four times a day (QID) | INTRAMUSCULAR | Status: DC | PRN

## 2023-02-12 MED ORDER — DIPHENHYDRAMINE HCL 50 MG/ML IJ SOLN: 12.5000 mg | Freq: Four times a day (QID) | INTRAMUSCULAR | Status: DC | PRN

## 2023-02-12 MED ORDER — DEXTROSE 50 % IV SOLN: 1.0000 | INTRAVENOUS | Status: DC | PRN

## 2023-02-12 MED ORDER — FENTANYL CITRATE PF 50 MCG/ML IJ SOSY
25.0000 ug | PREFILLED_SYRINGE | INTRAMUSCULAR | Status: DC | PRN
  Administered 2023-02-12 (×2): 25 ug via INTRAVENOUS
  Filled 2023-02-12 (×2): qty 1

## 2023-02-12 MED ORDER — METOPROLOL TARTRATE 5 MG/5ML IV SOLN
2.5000 mg | INTRAVENOUS | Status: DC | PRN
  Administered 2023-02-12: 2.5 mg via INTRAVENOUS
  Filled 2023-02-12: qty 5

## 2023-02-12 NOTE — Progress Notes (Signed)
Dr. Rosezena Sensor operative note and overnight events reviewed. Total abdominal colectomy with resection of distal ileum and creation of ileostomy for necrotic bowel. She is having postop abdominal pain, chest pain and hypotension with tachycardia.  She will have ongoing management by the medical, surgical and possible critical care service.  We will follow peripherally and check back next week.  Call back sooner if there is an urgent issue that we might be able to assist with.  Amada Jupiter, [facility]

## 2023-02-12 NOTE — Plan of Care (Signed)

## 2023-02-12 NOTE — Progress Notes (Signed)
Dr. Janalyn Shy was made aware that Pt c/o sharp 10/10 Chest pain. Urine out put was low for the nght. D5LR was increased to . Pt was given fentanyl IVP. EKG was done, Troponins were drawn. Dr. Janalyn Shy came to evaluate the pt.

## 2023-02-12 NOTE — Progress Notes (Signed)
Dr. Kirke Corin was made aware that the pt's VS. BP=104/61 HR=144 RR=24. POX=94% RA. LR IV bolus was given for sinus tach.

## 2023-02-12 NOTE — Progress Notes (Signed)
1 Day Post-Op   Subjective/Chief Complaint: Comfortable    Objective: Vital signs in last 24 hours: Temp:  [97.5 F (36.4 C)-101.8 F (38.8 C)] 97.6 F (36.4 C) (10/12 0820) Pulse Rate:  [120-144] 135 (10/12 1008) Resp:  [20-43] 20 (10/12 1008) BP: (87-126)/(32-94) 118/54 (10/12 0820) SpO2:  [93 %-100 %] 95 % (10/12 1008) FiO2 (%):  [29 %] 29 % (10/12 1008) Weight:  [64.2 kg] 64.2 kg (10/12 0326) Last BM Date : 02/10/23  Intake/Output from previous day: 10/11 0701 - 10/12 0700 In: 3566.5 [I.V.:894; Blood:1122.5; IV Piggyback:1550] Out: 1710 [Urine:650; Drains:210; Stool:100; Blood:750] Intake/Output this shift: Total I/O In: 0  Out: 20 [Drains:20]  Exam: Awake and alert Looks comfortable Abdomen soft, dressing dry, ostomy viable Drain serosang  Lab Results:  Recent Labs    02/12/23 0057 02/12/23 0448  WBC 22.8* 26.7*  HGB 12.7 12.0  HCT 37.5 36.2  PLT 750* 741*   BMET Recent Labs    02/12/23 0057 02/12/23 0448  NA 135 134*  K 4.4 4.5  CL 104 102  CO2 19* 20*  GLUCOSE 124* 166*  BUN 5* 7  CREATININE 0.68 0.69  CALCIUM 7.9* 8.0*   PT/INR No results for input(s): "LABPROT", "INR" in the last 72 hours. ABG Recent Labs    02/11/23 1633  PHART 7.314*  HCO3 18.7*    Studies/Results: [place] EKG SITE RITE  Result Date: 02/10/2023 If Site Rite image not attached, placement could not be confirmed due to current cardiac rhythm.   Anti-infectives: Anti-infectives (From admission, onward)    Start     Dose/Rate Route Frequency Ordered Stop   02/11/23 2000  cefTRIAXone (ROCEPHIN) 2 g in sodium chloride 0.9 % 100 mL IVPB       Note to Pharmacy: Pharmacy may adjust dosing strength / duration / interval for maximal efficacy   2 g 200 mL/hr over 30 Minutes Intravenous Every 24 hours 02/11/23 1622     02/10/23 1600  metroNIDAZOLE (FLAGYL) IVPB 500 mg        500 mg 100 mL/hr over 60 Minutes Intravenous Every 12 hours 02/10/23 1444     02/08/23 1800   vancomycin (VANCOCIN) 50 mg/mL oral solution SOLN 500 mg        500 mg Per Tube Every 6 hours 02/08/23 1325 02/12/23 1759   02/03/23 2200  metroNIDAZOLE (FLAGYL) tablet 500 mg        500 mg Per Tube Every 12 hours 02/03/23 1406 02/06/23 2148   02/02/23 1815  vancomycin (VANCOCIN) 50 mg/mL oral solution SOLN 125 mg  Status:  Discontinued        125 mg Per Tube Every 6 hours 02/02/23 1717 02/08/23 1325   02/02/23 1545  vancomycin (VANCOCIN) capsule 125 mg  Status:  Discontinued        125 mg Oral 4 times daily 02/02/23 1530 02/02/23 1717   01/27/23 1430  cefTRIAXone (ROCEPHIN) 2 g in sodium chloride 0.9 % 100 mL IVPB        2 g 200 mL/hr over 30 Minutes Intravenous Every 24 hours 01/27/23 1340 02/05/23 1403   01/27/23 1400  metroNIDAZOLE (FLAGYL) IVPB 500 mg  Status:  Discontinued        500 mg 100 mL/hr over 60 Minutes Intravenous Every 12 hours 01/27/23 1340 02/03/23 1406   01/27/23 0545  vancomycin (VANCOREADY) IVPB 1250 mg/250 mL        1,250 mg 166.7 mL/hr over 90 Minutes Intravenous  Once 01/27/23 0542 01/27/23  0809   01/27/23 0545  ceFEPIme (MAXIPIME) 2 g in sodium chloride 0.9 % 100 mL IVPB        2 g 200 mL/hr over 30 Minutes Intravenous  Once 01/27/23 0542 01/27/23 0621   01/27/23 0545  metroNIDAZOLE (FLAGYL) IVPB 500 mg        500 mg 100 mL/hr over 60 Minutes Intravenous  Once 01/27/23 0542 01/27/23 0741       Assessment/Plan: POD 1 s/p total abdominal colectomy and ileostomy 10/11 TC  -continue NG, wound care, ostomy care -hgb stable, WBC increase likely reaction to surgery -remains tachycardic  Abigail Miyamoto MD 02/12/2023

## 2023-02-12 NOTE — Progress Notes (Addendum)
HOSPITALIST ROUNDING NOTE [name] NGE:[id number]  DOB: [birth year]  DOA: 01/27/2023  PCP: [name], PA-C  02/12/2023,8:17 AM   LOS: 16 days      Code Status: Full From: Home  current Dispo: Unclear at this time    51 year old COPD hypertension DM TY 2 Crohn's disease on Skyrizi, seizure disorder, COPD, DM TY 2 A1c 5.8 on 9/26 Was refusing to come to the hospital after having some questionable GI bleed at home found down 9/26 and large pool M sis with blood, diarrhea, blood pressure 60 CPR X5 minutes + epi intubated transferred to [facility] creatinine 3.3 hemoglobin normal on Levophed lactic acid 8 ammonia 57 GI consulted General Surgery consulted Flex sig as below 10/10 and underwent subtotal colectomy  Significant events: 9/26>> admit to ICU-intubated/pressors 9/29>> off pressors 10/2>> extubated-flexible sigmoidoscopy 10/4>> transferred to [facility] 10/10>> flexible sigmoidoscopy-diffuse severe inflammation-surgical consult   Significant studies: 9/26>> CXR: No pneumonia 9/26>> x-ray abdomen: Mildly dilated small bowel loops 9/26>> CT head: No acute abnormalities 9/26>> CT abdomen/pelvis: Infectious or inflammatory proctocolitis 9/30>> CT abdomen/pelvis: Fused colitis/proctitis-small bowel enteritis 10/7>> CTA chest: No PE 10/7>> bilateral lower extremity Doppler: No DVT 10/7>> echo: EF> 75% 10/8>> x-ray abdomen: Mild to moderate bowel distention suggestive of ileus. 10/10>> x-ray abdomen: Stable ileus   Significant microbiology data: 9/26>> COVID/influenza/RSV PCR: Negative 9/26>> blood: Staph hominis (contamination) 9/29>> stool GI pathogen panel: Negative 9/29>> stool C. difficile: Antigen +ve-toxin -ve   Procedures: 10/2>> flexible sigmoidoscopy: Severe mucosal changes in the rectum-consistent with Crohn's disease-and as well as pseudomembranes-consistent with C. difficile colitis 10/10>> flexible sigmoidoscopy: Deferred severe inflammation in the rectum/sigmoid  colon 10/11 total abdominal colectomy with ileostomy-necrotic descending sigmoid and proximal rectum liquefied dead large abscess walled off and terminal ileum  Plan  Septic shock on admission requiring intubation etc. secondary to fulminant C. Difficile Shock physiology seems improved she is tachycardic and hypertensive probably because of pain but recent surgery as well Was given albumin 60 overnight and is on NS 125 cc/H after a bolus of IV fluid overnight additionally We will place her on fluids at 125 cc/H and follow and likely de-escalate fluids in the next day or so See below discussion  Total abdominal colectomy 10/11 with ileostomy necrotic bowel noted Continue ceftriaxone Flagyl for abdominal coverage She had a fever 101.8 overnight but had a massive surgery 10/11 so this is somewhat expected-her elevated white count is also expected--- if she becomes hypotensive or shocky she may need reexploration?-Defer to surgery I have changed as needed fentanyl pushes to PCA given pain with comorbid anxiety and I have changed her usual Ativan to IV-use Robaxin to 20 every 8 for spasm and can use IV Tylenol until return of gut function-continue lidocaine patch I think she will need TPN I will defer this to the surgeons See below   toxic megacolon with C. Difficile Administer vancomycin 500 every 6 hourly via OG if we think she can tolerate this, IV Flagyl started 10/10 additionally -will need at least 10 days overlap over and above coverage for ischemic bowel as per surgery  Cardiac arrest secondary to septic shock requiring CPR on admission CT angio no PE Current sinus tachycardia from pain, sepsis as above[she has longstanding sinus tach in the office above 100 per cardiology] Echocardiogram on admit within normal limits Cannot give GDMT at this time, on IV metoprolol every 4 as needed heart rate above 120--- stop all oral meds for now  Crohn's disease on Skyrizi Unclear if this plays  a  role in her overall condition Crohn's colitis/ischemic colitis/C. difficile all playing a role and not a candidate at this time for [facility] or any other immunosuppressants   pericardial effusion without tamponade Will need 6 to 8-week follow-up with Dr. Diona Browner in the outpatient setting for repeat echo  Toxic metabolic encephalopathy on admission-had a core track earlier-feeds on hold-await return of gut function  Seizure disorder-currently on Keppra 750 every 12 IV and transition to oral when able Anxiety disorder-Xanax discontinued-placed on IV Ativan Q40.5 Low-dose  DM TY 2 A1c 5.8 previously CBG 61 given a push of D50-stop long-acting insulin 5 twice daily completely continue sliding scale Hyperkalemia resolved   DVT prophylaxis: Heparinheparin  Status is: Inpatient Remains inpatient appropriate because:   Not ready for discharge to sick      Subjective: Awake coherent pain is 9/10 she looks relatively comfortable however no flatus multiple tubes in place   Objective + exam Vitals:   02/11/23 2319 02/12/23 0115 02/12/23 0247 02/12/23 0326  BP: 126/74 124/71 121/68 115/64  Pulse: (!) 144 (!) 137 (!) 120 (!) 123  Resp: (!) 38 (!) 22 (!) 22 (!) 31  Temp: (!) 101.8 F (38.8 C)  (!) 97.5 F (36.4 C) 99.5 F (37.5 C)  TempSrc: Axillary   Axillary  SpO2: 96% 94% 95% 94%  Weight:    64.2 kg  Height:       Filed Weights   02/09/23 0300 02/11/23 0500 02/12/23 0326  Weight: 66.3 kg 61.2 kg 64.2 kg    Examination:  EOMI NCAT NG tube as well as core track in place Chest is clear sinus tach into the 140s Abdomen is distended Blake drain in Nplate No lower extremity edema ROM is intact Neuro is intact to the power  Data Reviewed: reviewed   CBC    Component Value Date/Time   WBC 26.7 (H) 02/12/2023 0448   RBC 4.41 02/12/2023 0448   HGB 12.0 02/12/2023 0448   HGB 10.1 (L) 01/13/2022 1057   HCT 36.2 02/12/2023 0448   HCT 30.4 (L) 01/13/2022 1057   PLT 741 (H)  02/12/2023 0448   PLT 389 01/13/2022 1057   MCV 82.1 02/12/2023 0448   MCV 96 01/13/2022 1057   MCH 27.2 02/12/2023 0448   MCHC 33.1 02/12/2023 0448   RDW 15.0 02/12/2023 0448   RDW 13.4 01/13/2022 1057   LYMPHSABS 3.8 01/27/2023 0448   MONOABS 0.5 01/27/2023 0448   EOSABS 0.2 01/27/2023 0448   BASOSABS 0.0 01/27/2023 0448      Latest Ref Rng & Units 02/12/2023    4:48 AM 02/12/2023   12:57 AM 02/11/2023    4:33 PM  CMP  Glucose 70 - 99 mg/dL 253  664    BUN 6 - 20 mg/dL 7  5    Creatinine 4.03 - 1.00 mg/dL 4.74  2.59    Sodium 563 - 145 mmol/L 134  135  133   Potassium 3.5 - 5.1 mmol/L 4.5  4.4  5.6   Chloride 98 - 111 mmol/L 102  104    CO2 22 - 32 mmol/L 20  19    Calcium 8.9 - 10.3 mg/dL 8.0  7.9    Total Protein 6.5 - 8.1 g/dL 4.8  5.0    Total Bilirubin 0.3 - 1.2 mg/dL 0.7  0.6    Alkaline Phos 38 - 126 U/L 55  61    AST 15 - 41 U/L 17  20    ALT 0 -  44 U/L 12  13       Scheduled Meds:  Chlorhexidine Gluconate Cloth  6 each Topical Daily   Gerhardt's butt cream   Topical BID   heparin injection (subcutaneous)  5,000 Units Subcutaneous Q8H   insulin aspart  0-9 Units Subcutaneous Q4H   insulin glargine-yfgn  5 Units Subcutaneous BID   lidocaine  2 patch Transdermal Q24H   metoprolol succinate  50 mg Oral QPM   mouth rinse  15 mL Mouth Rinse 4 times per day   pantoprazole (PROTONIX) IV  40 mg Intravenous Q24H   vancomycin  500 mg Per Tube Q6H   Continuous Infusions:  sodium chloride 0 mL/hr at 02/02/23 0725   acetaminophen 1,000 mg (02/12/23 0757)   cefTRIAXone (ROCEPHIN)  IV 2 g (02/11/23 2208)   dextrose 5% lactated ringers 100 mL/hr at 02/12/23 0610   levETIRAcetam 750 mg (02/12/23 0031)   methocarbamol (ROBAXIN) IV 1,000 mg (02/12/23 1610)   metronidazole 500 mg (02/12/23 0416)    Time  60  Rhetta Mura, MD  [facility]

## 2023-02-12 NOTE — Progress Notes (Addendum)
Dr. Janalyn Shy was made aware that pt's temp is 101.8. BP=126/74. HR=144. RR=44. Glucose is 61 D50 25ml IVP given. Repeat glucose is 138 Semglee was held. Metoprolol 2.5mg  IVP was given twice for tachycardia.  HR came down to 118. Tylenol 1000 mg IVPB was given for temp. EKG was done. CBC, CMP, blood cultures, and lactic acide were drawn. D5LR was started at 21ml/ hour

## 2023-02-12 NOTE — Progress Notes (Addendum)
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 difficile colitis: S/p cardiac arrest:  Patient's nurse reporting that patient has high temperature 101.8 degrees Fahrenheit, tachycardic 114 and tachypneic 144.Marland Kitchen  Also patient blood glucose is low 61 and currently on Semglee and sliding scale insulin as well. Bedside nurse reported patient received total 2 units of blood transfusion postop.  Per chart review patient underwent surgery earlier today for frank necrosis of the proximal rectum and sigmoid colon with sigmoid colitis with perforation of the proximal rectum and large pelvic abscess.  Patient underwent total abdominal colectomy with ileostomy. The per chart review patient has been initially admitted for septic shock in setting of pulmonary colitis and had cardiac arrest. Please see Dr. Caryl Comes and Dr. Harriette Bouillon note for detail information.  Plan: -For elevated temperature giving IV Tylenol 1000 mg 3 times daily as needed for 1 day.  Then can switch to oral Tylenol. - Sinus tachycardia and in the response to pain.  Continue Lopressor 2.5 mg every Vapne as needed total 3 doses for heart rate above 120.  Unsure how much patient unable to tolerate oral Lopressor the setting of surgery tonight and also NG tube in place. -Patient is complaining about abdominal pain to 10 out of 10 intensity.  Ordered fentanyl 12.5 mg every 2 hour as needed. - For hypoglycemia recommending to hold Semglee and sliding scale insulin for tonight.  Also found out that patient is NPO. - Giving 1 L of LR bolus and continue  maintenance fluid D5 LR 50 cc/h for 1 day. -Currently patient is NPO.  Need to verify with general surgery about when to start diet for this patient. -Checking stat CBC, CMP and blood cultures. -Continue empiric antibiotic with IV ceftriaxone and metronidazole in the  setting of intra-abdominal infection.  Also on oral vancomycin for management of C. difficile.   Tereasa Coop, MD [facility] 02/12/2023, 12:14 AM

## 2023-02-12 NOTE — Progress Notes (Addendum)
  Chest pain: Patient is complaining about chest pain 10 out of 10 intensity.  She has been mostly located around the subxiphoid area/upper epigastric area around the surgical suture line. Blood pressure 111/58.  Heart rate 131.  Respiratory rate 24 O2 sat 94%. -Obtaining stat EKG and checking troponin level.  Addendum: EKG showing normal sinus rhythm sinus tachycardia 135.  There is no ST and T wave abnormality. High sensitive troponin 1 level is 8.  Ruling out acute coronary syndrome.  Noncardiac chest pain secondary to referred pain from the surgical site which is actually radiating from midepigastric to to subxiphoid's area.   Persistent sinus tachycardia -Also patient continues to have sinus tachycardia and given she is on NG tube suction cannot give oral Toprol-XL 50 mg daily which is she is at home.  Patient already IV metoprolol 2.5 mg 2 doses. -Ordered IV metoprolol 2.5 mg x 3 doses.   -Patient's blood pressure is borderline soft even with 2 L of LR bolus and currently on maintenance fluid D5 LR 100 cc/h.- -If patient blood pressure continues to run slow, remains tachycardic and become hypotensive and MAP below 65 need to transfer to ICU for pressor support.  Surgical incision site pain and abdominal pain - Patient is allergic to morphine and Dilaudid (history of swelling and shortness of breath).  Currently cannot give oral medications as has NG tube with suction in place. - Increasing dose of fentanyl 12.5 to 25 milligram every 2 hours as needed for moderate and severe pain.   Tereasa Coop, MD [facility] 02/12/2023, 6:05 AM

## 2023-02-12 NOTE — Progress Notes (Incomplete)
  Pt's temp is 101.8. BP=126/74. HR=144. RR=44. Could she have IV tylenol. She has NGT that is to LIT. Glucose was 61 D50 25ml IVP given. Should I hold her semglee tonight?

## 2023-02-13 ENCOUNTER — Encounter (HOSPITAL_COMMUNITY): Payer: Self-pay | Admitting: Gastroenterology

## 2023-02-13 ENCOUNTER — Inpatient Hospital Stay (HOSPITAL_COMMUNITY): Payer: Medicaid Other

## 2023-02-13 DIAGNOSIS — I469 Cardiac arrest, cause unspecified: Secondary | ICD-10-CM | POA: Diagnosis not present

## 2023-02-13 LAB — CBC
HCT: 31.7 % — ABNORMAL LOW (ref 36.0–46.0)
Hemoglobin: 10.6 g/dL — ABNORMAL LOW (ref 12.0–15.0)
MCH: 28.6 pg (ref 26.0–34.0)
MCHC: 33.4 g/dL (ref 30.0–36.0)
MCV: 85.4 fL (ref 80.0–100.0)
Platelets: 670 10*3/uL — ABNORMAL HIGH (ref 150–400)
RBC: 3.71 MIL/uL — ABNORMAL LOW (ref 3.87–5.11)
RDW: 15.8 % — ABNORMAL HIGH (ref 11.5–15.5)
WBC: 36.1 10*3/uL — ABNORMAL HIGH (ref 4.0–10.5)
nRBC: 0.2 % (ref 0.0–0.2)

## 2023-02-13 LAB — COMPREHENSIVE METABOLIC PANEL
ALT: 13 U/L (ref 0–44)
AST: 18 U/L (ref 15–41)
Albumin: 2.4 g/dL — ABNORMAL LOW (ref 3.5–5.0)
Alkaline Phosphatase: 45 U/L (ref 38–126)
Anion gap: 13 (ref 5–15)
BUN: <5 mg/dL — ABNORMAL LOW (ref 6–20)
CO2: 20 mmol/L — ABNORMAL LOW (ref 22–32)
Calcium: 8.2 mg/dL — ABNORMAL LOW (ref 8.9–10.3)
Chloride: 102 mmol/L (ref 98–111)
Creatinine, Ser: 0.62 mg/dL (ref 0.44–1.00)
GFR, Estimated: >60 mL/min (ref >60)
Glucose, Bld: 80 mg/dL (ref 70–99)
Potassium: 3.9 mmol/L (ref 3.5–5.1)
Sodium: 135 mmol/L (ref 135–145)
Total Bilirubin: 0.6 mg/dL (ref 0.3–1.2)
Total Protein: 4.9 g/dL — ABNORMAL LOW (ref 6.5–8.1)

## 2023-02-13 LAB — GLUCOSE, CAPILLARY
Glucose-Capillary: 78 mg/dL (ref 70–99)
Glucose-Capillary: 115 mg/dL — ABNORMAL HIGH (ref 70–99)
Glucose-Capillary: 136 mg/dL — ABNORMAL HIGH (ref 70–99)
Glucose-Capillary: 95 mg/dL (ref 70–99)
Glucose-Capillary: 110 mg/dL — ABNORMAL HIGH (ref 70–99)

## 2023-02-13 LAB — LACTIC ACID, PLASMA: Lactic Acid, Venous: 1.2 mmol/L (ref 0.5–1.9)

## 2023-02-13 LAB — MRSA NEXT GEN BY PCR, NASAL: MRSA by PCR Next Gen: NOT DETECTED

## 2023-02-13 MED ORDER — ACETAMINOPHEN 160 MG/5ML PO SOLN
650.0000 mg | Freq: Four times a day (QID) | ORAL | Status: DC
  Administered 2023-02-13 – 2023-02-15 (×8): 650 mg
  Filled 2023-02-13 (×8): qty 20.3

## 2023-02-13 MED ORDER — ALBUMIN HUMAN 25 % IV SOLN
50.0000 g | INTRAVENOUS | Status: AC
  Administered 2023-02-13: 50 g via INTRAVENOUS
  Filled 2023-02-13: qty 200

## 2023-02-13 MED ORDER — FENTANYL CITRATE PF 50 MCG/ML IJ SOSY
25.0000 ug | PREFILLED_SYRINGE | INTRAMUSCULAR | Status: DC | PRN
  Administered 2023-02-14 – 2023-02-20 (×28): 25 ug via INTRAVENOUS
  Filled 2023-02-13 (×28): qty 1

## 2023-02-13 MED ORDER — SODIUM CHLORIDE 0.9 % IV SOLN: INTRAVENOUS | Status: DC

## 2023-02-13 MED ORDER — ACETAMINOPHEN 325 MG PO TABS: 650.0000 mg | ORAL_TABLET | Freq: Four times a day (QID) | ORAL | Status: DC

## 2023-02-13 MED ORDER — NALOXONE HCL 0.4 MG/ML IJ SOLN
0.4000 mg | INTRAMUSCULAR | Status: AC
  Administered 2023-02-13: 0.4 mg via INTRAVENOUS

## 2023-02-13 MED ORDER — SODIUM CHLORIDE 0.9 % IV BOLUS
1000.0000 mL | Freq: Once | INTRAVENOUS | Status: AC
  Administered 2023-02-13: 1000 mL via INTRAVENOUS

## 2023-02-13 MED ORDER — LIDOCAINE 5 % EX PTCH
2.0000 | MEDICATED_PATCH | CUTANEOUS | Status: DC
  Administered 2023-02-13: 1 via TRANSDERMAL
  Administered 2023-02-14 – 2023-03-03 (×18): 2 via TRANSDERMAL
  Filled 2023-02-13 (×25): qty 2

## 2023-02-13 MED ORDER — METOPROLOL TARTRATE 5 MG/5ML IV SOLN
2.5000 mg | INTRAVENOUS | Status: AC
  Administered 2023-02-13: 2.5 mg via INTRAVENOUS
  Filled 2023-02-13: qty 5

## 2023-02-13 MED ORDER — GABAPENTIN 250 MG/5ML PO SOLN
100.0000 mg | Freq: Three times a day (TID) | ORAL | Status: DC
  Administered 2023-02-13 – 2023-03-01 (×48): 100 mg via ORAL
  Filled 2023-02-13 (×50): qty 2

## 2023-02-13 MED ORDER — METHOCARBAMOL 500 MG PO TABS
1000.0000 mg | ORAL_TABLET | Freq: Three times a day (TID) | ORAL | Status: DC
  Administered 2023-02-13 – 2023-02-18 (×15): 1000 mg
  Filled 2023-02-13 (×16): qty 2

## 2023-02-13 MED ORDER — METOPROLOL TARTRATE 5 MG/5ML IV SOLN
2.5000 mg | INTRAVENOUS | Status: AC
  Administered 2023-02-13: 2.5 mg via INTRAVENOUS

## 2023-02-13 MED ORDER — DEXTROSE IN LACTATED RINGERS 5 % IV SOLN: INTRAVENOUS | Status: AC

## 2023-02-13 NOTE — Progress Notes (Addendum)
  Sinus tachycardia: Bedside nurse reported that patient heart rate is 150 and borderline low blood pressure 103/65. -Giving 1 L of NS bolus stat and Lopressor 2.5 mg IV stat. - Continue as needed Lopressor and D5 40 mL/h.  Addendum: -Heart rate has been improved 150 to 125 after 2.5 mg of IV Lopressor however patient's blood pressure is soft 103/43 and MAP 63. - Giving albumin bolus, NS bolus 1 L and continue NS 125 cc/h for 10 hours.  Also continue D5 W 40 cc/h. -If MAP continues to run low below 65 patient need pressor support and ICU level of care.  Tereasa Coop, MD [facility] 02/13/2023, 1:05 AM

## 2023-02-13 NOTE — Progress Notes (Signed)
12 ml of fentanyl wasted with Camelia Eng ,RN

## 2023-02-13 NOTE — Progress Notes (Addendum)
NAMEGwenith [name], MRN:  [id number], DOB:  Mar 11, 1971, LOS: 17 ADMISSION DATE:  01/27/2023, CONSULTATION DATE: 01/27/2023 REFERRING MD: EDP, CHIEF COMPLAINT: Postcardiac arrest  History of Present Illness:   This is a 52 year old female with past medical history of COPD, Crohn's on Skyrizi, hypertension, type 2 diabetes mellitus who initially presented to the after being found unresponsive.  Per family, patient has had bloody emesis with diarrhea over the past few days.  Initial systolics in the 60s in the ED, and patient was minimally responsive.  In the emergency room, patient was intubated, and patient was admitted to the ICU for further evaluation and management for shock and altered mental status.  Pertinent  Medical History   Anxiety, asthma, chronic low back pain, Crohn's colitis, GERD type 2 diabetes mellitus, history of seizure disorder  Significant Hospital Events: Including procedures, antibiotic start and stop dates in addition to other pertinent events   01/27/2023: Admitted to ICU and intubated.  Felt to have septic shock of unclear source which resolved antibiotic therapy and fluid resuscitation.  Suspicion was that she may have had C. difficile colitis despite negative toxin.  She was treated with vancomycin. 9/29 Off pressors 10/2: Extubated 10/3 transfer to floor. 10/10 underwent repeat proctosigmoidoscopy for ongoing diarrhea.  Diffuse severe inflammation identified due to combination of Crohn's disease, active C. difficile infection and possible ischemic colitis from prior hypotension on presentation. 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  Intraoperative findings include frank necrosis with pelvic abscess formation and significant colitis. 10/12 some dilated small bowel with minimal OG output.  Continued observation advised.  Having significant postoperative pain. 10/13 became tachycardic and hypotensive.  More lethargic.  Transferred back to  ICU.  Interim History / Subjective:   Per hospitalist note patient was found hypotensive and tachycardic overnight despite attempts at fluid resuscitation.  Increasingly lethargic.  Brought to the ICU.  She has been tachycardic above 130 for several days.  Objective   Blood pressure (!) 109/44, pulse (!) 135, temperature 98.9 F (37.2 C), temperature source Axillary, resp. rate 20, height 5' 2.99" (1.6 m), weight 64.2 kg, SpO2 92%.   Intake/Output Summary (Last 24 hours) at 02/13/2023 0806 Last data filed at 02/13/2023 0500 Gross per 24 hour  Intake 2743.5 ml  Output 1140 ml  Net 1603.5 ml   Filed Weights   02/09/23 0300 02/11/23 0500 02/12/23 0326  Weight: 66.3 kg 61.2 kg 64.2 kg    Examination: General: Extubated, able to follow instructions, cortrack in place.  Lying quietly in bed. Eyes: No scleral icterus. Head: Normocephalic, atraumatic  Cardio: Tachycardic, regular rhythm, no murmurs, rubs, or gallops.  No peripheral edema extremities warm and well-perfused. Pulmonary: Clear to auscultation bilaterally. Abdomen: Mildly distended abdomen right lower lobe quadrant ostomy.  Positive ostomy output.  VAC dressing overlying midline incision extending the length of the abdomen.  Tenderness to palpation along the incision line.  Bowel sounds are present.  Minimal NG output. Neuro: Awake and following commands interactive. Skin: No rashes noted   Ancillary tests personally reviewed.  Marked leukocytosis 36.1 Anemia of acute illness 10.6 Reactive thrombocytosis 670 Electrolytes normal.  Normal LFTs. Echocardiogram showed hyperdynamic LV function TSH 4.8 last week. Assessment & Plan:   Ongoing septic/SIRS response following extensive intra-abdominal surgery with colectomy and abscess drainage.  Extensive colonic necrosis.  Ongoing inflammatory response expected. Sinus tachycardia likely secondary to above as well as poorly controlled pain. Expected postoperative  pain. Infectious colitis due to C. difficile on treatment  Longstanding Crohn's disease At high risk for malnutrition. Type 2 diabetes. History of seizures History of anxiety. History of hypertension History of COPD  Plan:  -Continue IV fluids.  Recheck lactate.  Continue current antibiotics and track CBC. -Start multimodal pain control.  Add around-the-clock Tylenol and gabapentin.  As needed fentanyl for breakthrough. -EKG to rule out SVT as cause of tachycardia. -Sliding scale insulin. -Spoke with general surgery: No evidence of obstruction.  Worsening SIRS response expected after such extensive surgery.  Manage expectantly for now. -Continue as needed lorazepam. -Continue current anticonvulsants.  As needed bronchodilators. -RD consult.  Consider TPN if unable to resume tube feeds soon.  Best Practice (right click and "Reselect all SmartList Selections" daily)   Diet/type: Ice chips for today. DVT prophylaxis: Heparin subcu GI prophylaxis: PPI Lines: Peripheral IVs only Foley: Keep Foley catheter for today. Code Status:  full code.  Daughter updated 10/2  Lynnell Catalan, MD [facility] ICU Physician [facility]  Pager: [phone] Mobile: [phone] After hours: [phone].

## 2023-02-13 NOTE — Progress Notes (Addendum)
Sustained sinus tachycardia Persistent hypotension Unspecified shock: -Throughout the night patient remains hypotensive MAP between 60-63 even with multiple boluses of fluid and albumin bolus.  Also patient remained sinus tachycardic heart rate in between 140-150. Bedside nurse reported that blood pressure has been improved to 112/52 (MAP 68) after giving 1 L of NS bolus, albumin 50 g and currently on NS 125 cc/h.  However patient continues to run sinus tachycardia 140.  Giving another dose of Lopressor 2.5 mg IV now. Due to soft blood pressure unable to start Cardizem drip. -Patient is very lethargic was fentanyl drip for the pain control.  Need to hold the fentanyl PCA.  Giving Narcan 0.4 mg IV stat. -Consulting ICU for evaluation.  Patient needs higher level of care  Addendum: -Spoke with ICU provider Dr. Kandis Mannan who graciously has been accepted the patient and transferring to ICU for higher level of care.   Tereasa Coop, MD [facility] 02/13/2023, 6:28 AM

## 2023-02-13 NOTE — Progress Notes (Signed)
Pt  transferred to [facility] ICU room 04

## 2023-02-13 NOTE — Progress Notes (Addendum)
  Bedside nurse reported there is no output through the NG tube.  NG tube has been placed by general surgery after the colectomy yesterday 02/11/2023. -Ordering x-ray abdomen to make sure NG tube is in place.  Addendum: X-ray abdomen showing gastric tube in place.  New dilated small bowel loops worrisome for small bowel obstruction recommended further evaluation with CT. -Spoke with on-call general surgery Dr.Paul Stechschulte per general surgery it is not unexpected to have small bowel obstruction after a major abdominal surgery.  Recommended to continue the NG tube suction and there is no need for further imaging workup at this time.   Tereasa Coop, MD [facility] 02/13/2023, 1:33 AM

## 2023-02-13 NOTE — Consult Note (Signed)
WOC team received consult for new ileostomy placed by Dr. Luisa Hart 02/11/2023.  WOC team will plan to see Monday 02/14/2023 for initial evaluation/teaching session.    Thank you,    Priscella Mann MSN, RN-BC, [employer] [phone]

## 2023-02-13 NOTE — Progress Notes (Signed)
2 Days Post-Op   Subjective/Chief Complaint: Transferred to the unit this morning for persistent tachycardia Abdominal pain about the same Denies chest pain   Objective: Vital signs in last 24 hours: Temp:  [97.6 F (36.4 C)-99.6 F (37.6 C)] 98.9 F (37.2 C) (10/13 0333) Pulse Rate:  [123-151] 135 (10/13 0225) Resp:  [19-39] 20 (10/13 0354) BP: (103-117)/(35-56) 109/44 (10/13 0225) SpO2:  [87 %-98 %] 92 % (10/13 0354) FiO2 (%):  [29 %] 29 % (10/12 1156) Last BM Date : 02/12/23  Intake/Output from previous day: 10/12 0701 - 10/13 0700 In: 3472.1 [I.V.:2214.1; IV Piggyback:1258] Out: 1140 [Urine:1000; Drains:140] Intake/Output this shift: Total I/O In: 478.2 [I.V.:278.2; IV Piggyback:200] Out: 475 [Urine:400; Stool:75]  Exam: Awake and alert Abdomen a little distended, appropriately tender post op with no frank peritonitis Ostomy viable Drain serosang VAC in place  Lab Results:  Recent Labs    02/12/23 0448 02/13/23 0337  WBC 26.7* 36.1*  HGB 12.0 10.6*  HCT 36.2 31.7*  PLT 741* 670*   BMET Recent Labs    02/12/23 0448 02/13/23 0337  NA 134* 135  K 4.5 3.9  CL 102 102  CO2 20* 20*  GLUCOSE 166* 80  BUN 7 <5*  CREATININE 0.69 0.62  CALCIUM 8.0* 8.2*   PT/INR No results for input(s): "LABPROT", "INR" in the last 72 hours. ABG Recent Labs    02/11/23 1633  PHART 7.314*  HCO3 18.7*    Studies/Results: DG Abd 1 View  Result Date: 02/13/2023 CLINICAL DATA:  Nasogastric tube EXAM: ABDOMEN - 1 VIEW COMPARISON:  Abdominal x-ray 02/10/2023 FINDINGS: Nasogastric tube tip is near the gastric antrum. There are dilated small bowel loops measuring up to 4 cm, new from prior. Cholecystectomy clips are present. Multiple catheters overlie chest and abdomen. IMPRESSION: 1. Nasogastric tube tip is near the gastric antrum. 2. New dilated small bowel loops worrisome for small bowel obstruction. This can be further evaluated with CT. Electronically Signed   By: Darliss Cheney M.D.   On: 02/13/2023 01:55    Anti-infectives: Anti-infectives (From admission, onward)    Start     Dose/Rate Route Frequency Ordered Stop   02/11/23 2000  cefTRIAXone (ROCEPHIN) 2 g in sodium chloride 0.9 % 100 mL IVPB       Note to Pharmacy: Pharmacy may adjust dosing strength / duration / interval for maximal efficacy   2 g 200 mL/hr over 30 Minutes Intravenous Every 24 hours 02/11/23 1622     02/10/23 1600  metroNIDAZOLE (FLAGYL) IVPB 500 mg        500 mg 100 mL/hr over 60 Minutes Intravenous Every 12 hours 02/10/23 1444     02/08/23 1800  vancomycin (VANCOCIN) 50 mg/mL oral solution SOLN 500 mg        500 mg Per Tube Every 6 hours 02/08/23 1325 02/12/23 1759   02/03/23 2200  metroNIDAZOLE (FLAGYL) tablet 500 mg        500 mg Per Tube Every 12 hours 02/03/23 1406 02/06/23 2148   02/02/23 1815  vancomycin (VANCOCIN) 50 mg/mL oral solution SOLN 125 mg  Status:  Discontinued        125 mg Per Tube Every 6 hours 02/02/23 1717 02/08/23 1325   02/02/23 1545  vancomycin (VANCOCIN) capsule 125 mg  Status:  Discontinued        125 mg Oral 4 times daily 02/02/23 1530 02/02/23 1717   01/27/23 1430  cefTRIAXone (ROCEPHIN) 2 g in sodium chloride 0.9 % 100  mL IVPB        2 g 200 mL/hr over 30 Minutes Intravenous Every 24 hours 01/27/23 1340 02/05/23 1403   01/27/23 1400  metroNIDAZOLE (FLAGYL) IVPB 500 mg  Status:  Discontinued        500 mg 100 mL/hr over 60 Minutes Intravenous Every 12 hours 01/27/23 1340 02/03/23 1406   01/27/23 0545  vancomycin (VANCOREADY) IVPB 1250 mg/250 mL        1,250 mg 166.7 mL/hr over 90 Minutes Intravenous  Once 01/27/23 0542 01/27/23 0809   01/27/23 0545  ceFEPIme (MAXIPIME) 2 g in sodium chloride 0.9 % 100 mL IVPB        2 g 200 mL/hr over 30 Minutes Intravenous  Once 01/27/23 0542 01/27/23 0621   01/27/23 0545  metroNIDAZOLE (FLAGYL) IVPB 500 mg        500 mg 100 mL/hr over 60 Minutes Intravenous  Once 01/27/23 0542 01/27/23 0741        Assessment/Plan: POD 2 s/p total abdominal colectomy and ileostomy 10/11 TC   -WBC up and tachy.  I suspect this is still a SIRS response.  Given ostomy appearance, drain consistence, and exam, I do not think there is further bowel issues.  Too early to CT scan as well -ok to continue sips and chips -will follow closely  Abigail Miyamoto MD 02/13/2023

## 2023-02-13 NOTE — Progress Notes (Signed)
OT Cancellation Note  Patient Details Name: Kayelynn Abdou MRN: [id number] DOB: 28-Aug-1970   Cancelled Treatment:    Reason Eval/Treat Not Completed: Medical issues which prohibited therapy (Pt transferred to [facility] [place] ICU this morning and continues with red MEWS. will follow up as appropriate.)  Tyler Deis, OTR/L [facility] Acute Rehabilitation Office: [phone]   [name] 02/13/2023, 12:22 PM

## 2023-02-13 NOTE — Plan of Care (Signed)
?  Problem: Clinical Measurements: ?Goal: Ability to maintain clinical measurements within normal limits will improve ?Outcome: Progressing ?Goal: Will remain free from infection ?Outcome: Progressing ?Goal: Diagnostic test results will improve ?Outcome: Progressing ?  ?

## 2023-02-13 NOTE — Progress Notes (Signed)
Daughter updated of pt transfer.

## 2023-02-13 NOTE — Progress Notes (Signed)
IV Team consult received to place 3rd site for flagyl due to pt getting albumin. Pharmacy notified and states time for flagyl will be changed so pt does not need additional IV. Primary RN unavailable. Consulting civil engineer, [name] notified.

## 2023-02-13 NOTE — Progress Notes (Signed)
Pt heart rate sustaining in the 140-150's. Dr Janalyn Shy notified. MAP remains between 60-63 .  See orders. PCA pump on hold due to lethargy with approval from MD. Narcan administrated per MD order

## 2023-02-14 ENCOUNTER — Encounter (HOSPITAL_COMMUNITY): Payer: Self-pay | Admitting: Surgery

## 2023-02-14 DIAGNOSIS — A0472 Enterocolitis due to Clostridium difficile, not specified as recurrent: Secondary | ICD-10-CM | POA: Diagnosis not present

## 2023-02-14 DIAGNOSIS — R109 Unspecified abdominal pain: Secondary | ICD-10-CM | POA: Diagnosis not present

## 2023-02-14 DIAGNOSIS — K631 Perforation of intestine (nontraumatic): Secondary | ICD-10-CM

## 2023-02-14 DIAGNOSIS — R000 Tachycardia, unspecified: Secondary | ICD-10-CM | POA: Diagnosis not present

## 2023-02-14 LAB — CBC
HCT: 31.5 % — ABNORMAL LOW (ref 36.0–46.0)
Hemoglobin: 10.1 g/dL — ABNORMAL LOW (ref 12.0–15.0)
MCH: 27.4 pg (ref 26.0–34.0)
MCHC: 32.1 g/dL (ref 30.0–36.0)
MCV: 85.6 fL (ref 80.0–100.0)
Platelets: 611 10*3/uL — ABNORMAL HIGH (ref 150–400)
RBC: 3.68 MIL/uL — ABNORMAL LOW (ref 3.87–5.11)
RDW: 15.9 % — ABNORMAL HIGH (ref 11.5–15.5)
WBC: 35 10*3/uL — ABNORMAL HIGH (ref 4.0–10.5)
nRBC: 0.2 % (ref 0.0–0.2)

## 2023-02-14 LAB — COMPREHENSIVE METABOLIC PANEL
ALT: 10 U/L (ref 0–44)
AST: 14 U/L — ABNORMAL LOW (ref 15–41)
Albumin: 2.6 g/dL — ABNORMAL LOW (ref 3.5–5.0)
Alkaline Phosphatase: 60 U/L (ref 38–126)
Anion gap: 12 (ref 5–15)
BUN: <5 mg/dL — ABNORMAL LOW (ref 6–20)
CO2: 18 mmol/L — ABNORMAL LOW (ref 22–32)
Calcium: 8 mg/dL — ABNORMAL LOW (ref 8.9–10.3)
Chloride: 108 mmol/L (ref 98–111)
Creatinine, Ser: 0.35 mg/dL — ABNORMAL LOW (ref 0.44–1.00)
GFR, Estimated: >60 mL/min (ref >60)
Glucose, Bld: 90 mg/dL (ref 70–99)
Potassium: 3.1 mmol/L — ABNORMAL LOW (ref 3.5–5.1)
Sodium: 138 mmol/L (ref 135–145)
Total Bilirubin: 0.3 mg/dL (ref 0.3–1.2)
Total Protein: 4.9 g/dL — ABNORMAL LOW (ref 6.5–8.1)

## 2023-02-14 LAB — GLUCOSE, CAPILLARY
Glucose-Capillary: 103 mg/dL — ABNORMAL HIGH (ref 70–99)
Glucose-Capillary: 124 mg/dL — ABNORMAL HIGH (ref 70–99)
Glucose-Capillary: 140 mg/dL — ABNORMAL HIGH (ref 70–99)
Glucose-Capillary: 158 mg/dL — ABNORMAL HIGH (ref 70–99)
Glucose-Capillary: 82 mg/dL (ref 70–99)
Glucose-Capillary: 94 mg/dL (ref 70–99)
Glucose-Capillary: 96 mg/dL (ref 70–99)

## 2023-02-14 MED ORDER — POTASSIUM CHLORIDE 10 MEQ/100ML IV SOLN
10.0000 meq | INTRAVENOUS | Status: AC
  Administered 2023-02-14 (×4): 10 meq via INTRAVENOUS
  Filled 2023-02-14 (×4): qty 100

## 2023-02-14 MED ORDER — THIAMINE HCL 100 MG/ML IJ SOLN
100.0000 mg | Freq: Every day | INTRAMUSCULAR | Status: DC
  Administered 2023-02-14 – 2023-03-01 (×16): 100 mg via INTRAVENOUS
  Filled 2023-02-14 (×16): qty 2

## 2023-02-14 MED ORDER — OXYCODONE HCL 5 MG PO TABS: 5.0000 mg | ORAL_TABLET | ORAL | Status: DC | PRN

## 2023-02-14 MED ORDER — POTASSIUM CHLORIDE 20 MEQ PO PACK
20.0000 meq | PACK | ORAL | Status: AC
  Administered 2023-02-14 (×2): 20 meq
  Filled 2023-02-14 (×2): qty 1

## 2023-02-14 MED ORDER — VANCOMYCIN HCL IN DEXTROSE 1-5 GM/200ML-% IV SOLN
1000.0000 mg | Freq: Once | INTRAVENOUS | Status: AC
  Administered 2023-02-14: 1000 mg via INTRAVENOUS
  Filled 2023-02-14: qty 200

## 2023-02-14 MED ORDER — FOLIC ACID 5 MG/ML IJ SOLN
1.0000 mg | Freq: Every day | INTRAMUSCULAR | Status: DC
  Administered 2023-02-14 – 2023-03-01 (×16): 1 mg via INTRAVENOUS
  Filled 2023-02-14 (×16): qty 0.2

## 2023-02-14 MED ORDER — VANCOMYCIN HCL 750 MG/150ML IV SOLN
750.0000 mg | Freq: Two times a day (BID) | INTRAVENOUS | Status: DC
  Administered 2023-02-15 – 2023-02-17 (×4): 750 mg via INTRAVENOUS
  Filled 2023-02-14 (×4): qty 150

## 2023-02-14 MED ORDER — REVEFENACIN 175 MCG/3ML IN SOLN
175.0000 ug | Freq: Every day | RESPIRATORY_TRACT | Status: DC
  Administered 2023-02-14 – 2023-03-18 (×24): 175 ug via RESPIRATORY_TRACT
  Filled 2023-02-14 (×34): qty 3

## 2023-02-14 MED ORDER — FENTANYL 25 MCG/HR TD PT72
1.0000 | MEDICATED_PATCH | TRANSDERMAL | Status: DC
  Administered 2023-02-14 – 2023-03-16 (×11): 1 via TRANSDERMAL
  Filled 2023-02-14 (×11): qty 1

## 2023-02-14 MED ORDER — AMITRIPTYLINE HCL 50 MG PO TABS
25.0000 mg | ORAL_TABLET | Freq: Every day | ORAL | Status: DC
  Administered 2023-02-14 – 2023-03-13 (×28): 25 mg via ORAL
  Filled 2023-02-14 (×29): qty 1

## 2023-02-14 MED ORDER — VITAL AF 1.2 CAL PO LIQD
1000.0000 mL | ORAL | Status: DC
  Administered 2023-02-14: 1000 mL

## 2023-02-14 MED ORDER — PROSOURCE TF20 ENFIT COMPATIBL EN LIQD
60.0000 mL | Freq: Four times a day (QID) | ENTERAL | Status: DC
  Administered 2023-02-14 – 2023-02-17 (×9): 60 mL
  Filled 2023-02-14 (×10): qty 60

## 2023-02-14 MED ORDER — ADULT MULTIVITAMIN W/MINERALS CH
1.0000 | ORAL_TABLET | Freq: Every day | ORAL | Status: DC
  Administered 2023-02-14 – 2023-03-19 (×33): 1 via ORAL
  Filled 2023-02-14 (×33): qty 1

## 2023-02-14 NOTE — Progress Notes (Signed)
Pharmacy Antibiotic Note  [name] is a 52 y.o. female admitted on 01/27/2023 with sepsis.  Pharmacy has been consulted for vancomycin IV dosing.  Plan: Vancomycin 1g x 1, then 750 mg IV q 12 hrs.  Goal trough 15-20 mcg/mL. Calculated AUC 528 based on rounded Scr 0.8. F/u renal function, clinical course, vancomycin levels at steady state.  Height: 5' 2.99" (160 cm) Weight: 65.4 kg (144 lb 2.9 oz) IBW/kg (Calculated) : 52.38  Temp (24hrs), Avg:98.8 F (37.1 C), Min:97.8 F (36.6 C), Max:99.9 F (37.7 C)  Recent Labs  Lab 02/11/23 0427 02/11/23 1555 02/12/23 0057 02/12/23 0448 02/13/23 0337 02/13/23 0913 02/14/23 0244  WBC 10.2  --  22.8* 26.7* 36.1*  --  35.0*  CREATININE 0.71 0.70 0.68 0.69 0.62  --  0.35*  LATICACIDVEN  --   --  1.9  --   --  1.2  --     Estimated Creatinine Clearance: 74 mL/min (A) (by C-G formula based on SCr of 0.35 mg/dL (L)).    Allergies  Allergen Reactions   Codeine Shortness Of Breath, Swelling and Rash    Throat swelling   Dilaudid [Hydromorphone Hcl] Shortness Of Breath and Swelling   Hydrocodone Shortness Of Breath, Swelling and Rash   Ketorolac Nausea And Vomiting and Swelling    Pt reports n/v and swelling to throat   Morphine And Codeine Shortness Of Breath, Swelling and Rash   Oxycodone Shortness Of Breath, Swelling and Rash    Patient states ALL pain medications make her deathly sick.   Penicillins Shortness Of Breath, Swelling and Other (See Comments)    Throat swells 02/06/21--TOLERATES CEFTRIAXONE     Nickel Rash   Pregabalin Other (See Comments)   Acetaminophen     Per MD patient states she can't take Tylenol because of liver enzymes   Ativan [Lorazepam] Other (See Comments)    Migraines.   Strawberry Extract Swelling   Watermelon Concentrate Swelling   Albuterol Palpitations   Benadryl [Diphenhydramine Hcl] Palpitations   Humira [Adalimumab] Rash   Latex Rash   Remicade [Infliximab] Rash    Blisters and Welts     Tramadol Rash    Rash and itching    Antimicrobials this admission: PO vanco 10/2 >> 10/12 Rocephin 9/26 >>10/6; 10/11 >  Flagyl 9/26 >>10/6; 10/10 >  Vancomycin IV 10/14 >   Dose adjustments this admission:  Microbiology results: 9/26 BCx - GPCs 1/4, BCID: staph spp.  9/26 MRSA PCR - negative 9/29 Cdiff PCR: positive, Cdiff toxin negative 9/30 CT ab: ? abscess, peritonitis   10/12 BCx x 2 > ngtd   Thank you for allowing pharmacy to be a part of this patient's care.  Reece Leader, Colon Flattery, BCCP Clinical Pharmacist  02/14/2023 11:59 AM   [facility] pharmacy phone numbers are listed on [email or web address]

## 2023-02-14 NOTE — Progress Notes (Signed)
Occupational Therapy Re-evaluation Patient Details Name: [name] MRN: [id number] DOB: [birth year] Today's Date: 02/14/2023   History of present illness 52 y.o. female presents to [facility] hospital on 01/27/2023 after being found unresponsive at home down in a pool of bloody vomit/stool. CPR was initiated upon arrival to ED, pt intubated and started on pressors. Pt undergoing management for inflammatory vs infections colitis. Pt extubated on 10/2. 10/10 underwent repeat proctosigmoidoscopy for ongoing diarrhea.  Diffuse severe inflammation identified due to combination of Crohn's disease, active C. difficile infection and possible ischemic colitis from prior hypotension on presentation. 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 10/13 became tachycardic and hypotensive.  More lethargic.  Transferred back to ICU. PMH includes COPD, Crohn's disease, HTN, DMII.   OT comments  Pt goals re-established after re-evaluation, Pt with more complex medical stay now and updated POC to >3 hour therapy post-acute. She is requiring mod A +2 for transfers and short mobilization, max to total A for LB ADL, set up to max A for UB ADL. Today while using the RW she was unable to come upright due to pain in her stomach. HR was elevated throughout 140-151, and RR up to 40 (suspect in pain response to mobility). BP WFL. OT will continue to follow acutely.       If plan is discharge home, recommend the following:  Assistance with cooking/housework;Direct supervision/assist for medications management;Direct supervision/assist for financial management;Assist for transportation;Help with stairs or ramp for entrance;A little help with walking and/or transfers;A lot of help with bathing/dressing/bathroom   Equipment Recommendations  Other (comment) (defer to next venue of care)    Recommendations for Other Services      Precautions / Restrictions Precautions Precautions: Fall;Other (comment) Precaution  Comments: watch HR/RR, bulb drain L, new colostomy, abdominal wound vac Restrictions Weight Bearing Restrictions: No       Mobility Bed Mobility Overal bed mobility: Needs Assistance Bed Mobility: Rolling, Sidelying to Sit Rolling: Mod assist, Used rails, +2 for safety/equipment Sidelying to sit: Mod assist, Used rails, HOB elevated, +2 for safety/equipment       General bed mobility comments: multimodal cues for all aspects of bed mobility and physical assist for legs off the bed, and trunk elevation    Transfers Overall transfer level: Needs assistance Equipment used: Rolling walker (2 wheels) Transfers: Sit to/from Stand Sit to Stand: Mod assist, +2 physical assistance, +2 safety/equipment, From elevated surface (ICU bed)           General transfer comment: rocking momentum used, initially unable to achieve full upright, required 2nd attempt and more assist     Balance Overall balance assessment: Needs assistance Sitting-balance support: Feet supported, Bilateral upper extremity supported Sitting balance-Leahy Scale: Poor     Standing balance support: Bilateral upper extremity supported, Reliant on assistive device for balance, During functional activity Standing balance-Leahy Scale: Poor Standing balance comment: B UE support and external                           ADL either performed or assessed with clinical judgement   ADL Overall ADL's : Needs assistance/impaired     Grooming: Oral care;Sitting;Minimal assistance               Lower Body Dressing: Maximal assistance;Bed level Lower Body Dressing Details (indicate cue type and reason): unable to perform figure 4 at this time Toilet Transfer: Moderate assistance;+2 for physical assistance;+2 for safety/equipment;Rolling walker (2 wheels);Cueing for safety;Cueing for  sequencing;Ambulation           Functional mobility during ADLs: Moderate assistance;+2 for physical assistance;+2 for  safety/equipment;Rolling walker (2 wheels);Cueing for safety;Cueing for sequencing General ADL Comments: decreased ability to access LB for ADL, deconditioned, painful    Extremity/Trunk Assessment Upper Extremity Assessment Upper Extremity Assessment: Generalized weakness   Lower Extremity Assessment Lower Extremity Assessment: Defer to PT evaluation        Vision       Perception     Praxis      Cognition Arousal: Alert Behavior During Therapy: WFL for tasks assessed/performed Overall Cognitive Status: Impaired/Different from baseline Area of Impairment: Memory, Following commands, Safety/judgement, Problem solving                     Memory: Decreased short-term memory Following Commands: Follows one step commands with increased time Safety/Judgement: Decreased awareness of deficits, Decreased awareness of safety   Problem Solving: Slow processing, Decreased initiation, Difficulty sequencing General Comments: did not test orientation, increased time to follow cues and lots of multimodal cues during functional tasks        Exercises      Shoulder Instructions       General Comments HR intially 140, and up to 151 with ambulation, RR up for 38 with activity, BP WFL    Pertinent Vitals/ Pain       Pain Assessment Pain Assessment: 0-10 Pain Score: 10-Worst pain ever Faces Pain Scale: Hurts whole lot Pain Location: abdomen Pain Descriptors / Indicators: Discomfort, Grimacing, Tightness, Moaning, Operative site guarding Pain Intervention(s): Monitored during session, Repositioned, RN gave pain meds during session  Home Living                                          Prior Functioning/Environment              Frequency  Min 1X/week        Progress Toward Goals  OT Goals(current goals can now be found in the care plan section)  Progress towards OT goals: Progressing toward goals  Acute Rehab OT Goals Patient Stated Goal:  see Grandchildren, get back to golf OT Goal Formulation: With patient Time For Goal Achievement: 02/28/23 Potential to Achieve Goals: Good  Plan      Co-evaluation    PT/OT/SLP Co-Evaluation/Treatment: Yes Reason for Co-Treatment: Complexity of the patient's impairments (multi-system involvement);For patient/therapist safety;To address functional/ADL transfers PT goals addressed during session: Mobility/safety with mobility;Proper use of DME;Strengthening/ROM OT goals addressed during session: ADL's and self-care;Strengthening/ROM;Proper use of Adaptive equipment and DME      AM-PAC OT "6 Clicks" Daily Activity     Outcome Measure   Help from another person eating meals?: A Little Help from another person taking care of personal grooming?: A Little Help from another person toileting, which includes using toliet, bedpan, or urinal?: Total Help from another person bathing (including washing, rinsing, drying)?: A Lot Help from another person to put on and taking off regular upper body clothing?: A Little Help from another person to put on and taking off regular lower body clothing?: Total 6 Click Score: 13    End of Session Equipment Utilized During Treatment: Gait belt;Rolling walker (2 wheels)  OT Visit Diagnosis: Unsteadiness on feet (R26.81);Other abnormalities of gait and mobility (R26.89);Muscle weakness (generalized) (M62.81);Other symptoms and signs involving cognitive function   Activity Tolerance Patient  tolerated treatment well   Patient Left in chair;with call bell/phone within reach;with family/visitor present   Nurse Communication Patient requests pain meds        Time: 2956-2130 OT Time Calculation (min): 40 min  Charges: OT General Charges $OT Visit: 1 Visit OT Evaluation $OT Re-eval: 1 Re-eval OT Treatments $Self Care/Home Management : 8-22 mins Nyoka Cowden OTR/L Acute Rehabilitation Services Office: [phone]  Evern Bio [facility] 02/14/2023, 12:24  PM

## 2023-02-14 NOTE — Progress Notes (Signed)
Physical Therapy Re-EVAL Patient Details Name: [name] MRN: [id number] DOB: 1970/08/17 Today's Date: 02/14/2023   History of Present Illness 52 y.o. female presents to [facility] hospital on 01/27/2023 after being found unresponsive at home down in a pool of bloody vomit/stool. CPR was initiated upon arrival to ED, pt intubated and started on pressors. Pt undergoing management for inflammatory vs infections colitis. Pt extubated on 10/2. 10/10 underwent repeat proctosigmoidoscopy for ongoing diarrhea.  Diffuse severe inflammation identified due to combination of Crohn's disease, active C. difficile infection and possible ischemic colitis from prior hypotension on presentation. 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 10/13 became tachycardic and hypotensive.  More lethargic.  Transferred back to ICU. PMH includes COPD, Crohn's disease, HTN, DMII.    PT Comments  Pt unfortunately had to be transferred back to ICU. Pt very deconditioned with poor activity tolerance and generalized weakness in addition to multiple lines to abdomen including colostomy, wound vac, and jp drain. Pt was indep, working, and living with a roommate PTA. Pt to benefit from inpatient rehab program >3hrs a day to address above deficits and progress to safe mod I level of function. Acute PT to con't to follow.    If plan is discharge home, recommend the following: A lot of help with bathing/dressing/bathroom;Two people to help with walking and/or transfers;Assistance with cooking/housework;Direct supervision/assist for medications management;Assist for transportation;Supervision due to cognitive status;Direct supervision/assist for financial management;Help with stairs or ramp for entrance   Can travel by private vehicle        Equipment Recommendations  Rolling walker (2 wheels);BSC/3in1;Wheelchair (measurements PT);Wheelchair cushion (measurements PT)    Recommendations for Other Services       Precautions /  Restrictions Precautions Precautions: Fall;Other (comment) Precaution Comments: watch HR/RR, bulb drain L, new colostomy, abdominal wound vac Restrictions Weight Bearing Restrictions: No     Mobility  Bed Mobility Overal bed mobility: Needs Assistance Bed Mobility: Rolling, Sidelying to Sit Rolling: Mod assist, Used rails, +2 for safety/equipment Sidelying to sit: Mod assist, Used rails, HOB elevated, +2 for safety/equipment Supine to sit: Min assist Sit to supine: Contact guard assist   General bed mobility comments: multimodal cues for all aspects of bed mobility and physical assist for legs off the bed, and trunk elevation    Transfers Overall transfer level: Needs assistance Equipment used: Rolling walker (2 wheels) Transfers: Sit to/from Stand Sit to Stand: Mod assist, +2 physical assistance, +2 safety/equipment, From elevated surface (ICU bed) Stand pivot transfers: Mod assist, +2 physical assistance         General transfer comment: rocking momentum used, initially unable to achieve full upright, required 2nd attempt and more assist    Ambulation/Gait Ambulation/Gait assistance: Min assist, +2 safety/equipment Gait Distance (Feet): 10 Feet Assistive device: Rolling walker (2 wheels) Gait Pattern/deviations: Step-through pattern, Decreased stride length, Trunk flexed Gait velocity: decreased Gait velocity interpretation: <1.31 ft/sec, indicative of household ambulator   General Gait Details: tactile cues from OT at posterior hips and PT at chest to obtain more upright posture, limited by pulling on abdomen from wound vac, HR increased to 151, RR up into 40s, SpO2 >94% on RA. Pt with minimal bilat foot clearance   Stairs             Wheelchair Mobility     Tilt Bed    Modified Rankin (Stroke Patients Only)       Balance Overall balance assessment: Needs assistance Sitting-balance support: Feet supported, Bilateral upper extremity supported Sitting  balance-Leahy  Scale: Poor Sitting balance - Comments: dependent on UEs to stabilize self   Standing balance support: Bilateral upper extremity supported, Reliant on assistive device for balance, During functional activity Standing balance-Leahy Scale: Poor Standing balance comment: B UE support and external                            Cognition Arousal: Alert Behavior During Therapy: WFL for tasks assessed/performed Overall Cognitive Status: Impaired/Different from baseline Area of Impairment: Memory, Following commands, Safety/judgement, Problem solving                     Memory: Decreased short-term memory Following Commands: Follows one step commands with increased time Safety/Judgement: Decreased awareness of deficits, Decreased awareness of safety   Problem Solving: Slow processing, Decreased initiation, Difficulty sequencing General Comments: delayed response time, flat affect but engaged        Exercises      General Comments General comments (skin integrity, edema, etc.): HR 142 at rest, highest at 151 during amb      Pertinent Vitals/Pain Pain Assessment Pain Assessment: 0-10 Pain Score: 10-Worst pain ever Pain Location: abdomen Pain Descriptors / Indicators: Discomfort, Grimacing, Tightness, Moaning, Operative site guarding    Home Living                          Prior Function            PT Goals (current goals can now be found in the care plan section) Acute Rehab PT Goals Patient Stated Goal: go home PT Goal Formulation: With patient Time For Goal Achievement: 02/17/23 Potential to Achieve Goals: Good Progress towards PT goals: Progressing toward goals    Frequency    Min 1X/week      PT Plan      Co-evaluation PT/OT/SLP Co-Evaluation/Treatment: Yes Reason for Co-Treatment: Complexity of the patient's impairments (multi-system involvement);For patient/therapist safety;To address functional/ADL transfers PT  goals addressed during session: Mobility/safety with mobility;Proper use of DME;Strengthening/ROM OT goals addressed during session: ADL's and self-care;Strengthening/ROM;Proper use of Adaptive equipment and DME      AM-PAC PT "6 Clicks" Mobility   Outcome Measure  Help needed turning from your back to your side while in a flat bed without using bedrails?: A Little Help needed moving from lying on your back to sitting on the side of a flat bed without using bedrails?: A Little Help needed moving to and from a bed to a chair (including a wheelchair)?: A Little Help needed standing up from a chair using your arms (e.g., wheelchair or bedside chair)?: A Lot Help needed to walk in hospital room?: A Lot (<20 ft) Help needed climbing 3-5 steps with a railing? : Total 6 Click Score: 14    End of Session Equipment Utilized During Treatment: Gait belt Activity Tolerance: Patient limited by fatigue Patient left: in chair;with call bell/phone within reach;with chair alarm set;with family/visitor present Nurse Communication: Mobility status PT Visit Diagnosis: Other abnormalities of gait and mobility (R26.89);Muscle weakness (generalized) (M62.81)     Time: 0102-7253 PT Time Calculation (min) (ACUTE ONLY): 36 min  Charges:      PT General Charges $$ ACUTE PT VISIT: 1 Visit                     Lewis Shock, PT, DPT Acute Rehabilitation Services Secure chat preferred Office #: [phone]    Iona Hansen  02/14/2023, 4:17 PM

## 2023-02-14 NOTE — TOC Progression Note (Signed)
Transition of Care [facility]) - Progression Note    Patient Details  Name: [name] MRN: [id number] Date of Birth: 06-26-1970  Transition of [facility] CM/SW Contact  Delilah Shan, LCSWA Phone Number: 02/14/2023, 2:42 PM  Clinical Narrative:     CSW continuing to follow. Cortrak still in place.    Expected Discharge Plan: Skilled Nursing Facility Barriers to Discharge: Continued Medical Work up, SNF Pending bed offer, English as a second language teacher  Expected Discharge Plan and Services In-house Referral: Clinical Social Work Discharge Planning Services: CM Consult                                           Social Determinants of Health (SDOH) Interventions SDOH Screenings   Food Insecurity: Food Insecurity Present (10/18/2022)   Received from [facility]  Housing: Low Risk  (01/16/2022)  Transportation Needs: Unmet Transportation Needs (10/18/2022)   Received from [facility]  Utilities: Not At Risk (10/18/2022)   Received from [facility]  Financial Resource Strain: Medium Risk (10/18/2022)   Received from [facility]  Social Connections: Unknown (09/01/2021)   Received from [facility], [facility]  Stress: Stress Concern Present (06/25/2020)   Received from [facility], [facility]  Tobacco Use: High Risk (02/11/2023)    Readmission Risk Interventions     No data to display

## 2023-02-14 NOTE — Consult Note (Addendum)
WOC Nurse wound consult note Pt had ileostomy surgery on 10/11.  Stoma type/location: Stoma is red and viable, slightly above skin level, 1 1/2 inches.  Mod amt liquid green stool in the pouch.  Peristomal assessment: intact Ostomy pouching: Applied barrier ring and one piece pouch.  Pt will need a flexible convex pouch with next change.  Education provided: Pt is critically ill in ICU and no family is present. WOC team will begin teaching sessions when she is stable and out of ICU.  5 sets of supplies ordered to the bedside: Use Supplies: barrier ring, Lawson # [id number] and convex pouch Lawson # [id number].  Educational materials left at the bedside.  Enrolled patient in [employer] DC program: Not yet  Post note 11:00 WOC Nurse ostomy consult Note: Reason for Consult: Consult requested to change abd Vac dressing to full thickness post-op midline wound.  Surgical team following for assessment and plan of care.  Pt was medicated for pain prior to the procedure and tolerated with mod amt discomfort. Applied one piece black foam to cont suction.  Wound is beefy red, 18X1X3cm.  WOC team will change again on Thursday.  Supplies at the bedside.  Thank-you,  Cammie Mcgee MSN, RN, CWOCN, [place], CNS [phone]

## 2023-02-14 NOTE — Progress Notes (Signed)
Nutrition Follow-up  DOCUMENTATION CODES:   Not applicable  INTERVENTION:   Surgery ok with trial of trickle TF via Cortrak tube in addition of CL trays. If pt does not tolerate diet advancement or TF, recommend initiation of TPN  Tube Feeding via Cortrak:  Vital AF 1.2 at 20 ml/hr (trickle only) Pro-source TF20 60 mL QID, each packet provides 20 g of protein and 80 kcals.   Add MVI with Minerals Continue IV thiamine and folic acid per MD  Encouraged pt to take small sips and bites of liquids throughout the day. May benefit from avoiding carbonation, straws, chewing gum etc which can introduce air to GI tract and cause gas.  If continues to tolerate CL, plan to add oral nutrition supplement.   NUTRITION DIAGNOSIS:   Inadequate oral intake related to inability to eat as evidenced by NPO status.  Being addressed via diet, TF  GOAL:   Patient will meet greater than or equal to 90% of their needs  Being addressed  MONITOR:   Diet advancement, Skin, Labs, Weight trends, TF tolerance  REASON FOR ASSESSMENT:   Consult Assessment of nutrition requirement/status  ASSESSMENT:   52 year old female with past medical history of COPD, Crohn's on Skyri[place], hypertension, type 2 diabetes mellitus who initially presented to the after being found unresponsive.  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 Pt denies N/V currently.  Pt does indicate some abdominal pain, reports at surgical sites. Pt did let out a large burp while RD present and stated she felt much better  after that.   Ileostomy with 974 mL in 24 hours; 600 mL thus far today. Wound VAC to abdomen with 100 mL in 24 hours, JP drain with 75 mL.   Per surgery, ok to start trickle TF via Cortrak  Current wt 65.4 kg; lowest wt 61.2 kg prior to surgery on 10/11. +nonpitting edema  Labs: potassium 3.1 (wdl),  Meds: ss novolog , IV thiamine and folic acid       Diet Order:   Diet Order             Diet clear liquid Room service appropriate? Yes; Fluid consistency: Thin  Diet effective now                   EDUCATION NEEDS:   Not appropriate for education at this time  Skin:  Skin Assessment: Skin Integrity Issues: Skin Integrity Issues:: Stage II, Wound VAC, Other (Comment) Stage II: lip (ETT holder) Wound Vac: abdomen (closed surgical incision) Other: MASD to perineum  Last BM:  Ileostomy with 975 mL in 24 hours  Height:   Ht Readings from Last 1 Encounters:  02/02/23 5' 2.99" (1.6 m)    Weight:   Wt Readings from Last 1 Encounters:  02/14/23 65.4 kg    Ideal Body Weight:  52.3 kg  BMI:  Body mass index is 25.55 kg/m.  Estimated Nutritional Needs:   Kcal:  1700-1900 kcal/d  Protein:  85-100 g/d  Fluid:  >1.8L/d   Romelle Starcher MS, RDN, LDN, CNSC Registered Dietitian 3 Clinical Nutrition RD Pager  and Software engineer Number Located in [place]

## 2023-02-14 NOTE — TOC Initial Note (Signed)
Transition of Care [facility]) - Initial/Assessment Note    Patient Details  Name: [name] MRN: [id number] Date of Birth: 1970/10/02  Transition [facility] Care [facility]) CM/SW Contact:    Elliot Cousin, RN Phone Number: [phone] 02/14/2023, 11:39 AM  Clinical Narrative:  CM spoke to pt and states she was independent at home. Has room-mate that lives in home. Has cane. CSW following for SNF placement at dc. Will continue to follow for dc needs.                  Expected Discharge Plan: Skilled Nursing Facility Barriers to Discharge: Continued Medical Work up, SNF Pending bed offer, Insurance Authorization   Patient Goals and CMS Choice            Expected Discharge Plan and Services In-house Referral: Clinical Social Work Discharge Planning Services: CM Consult                                          Prior Living Arrangements/Services     Patient language and need for interpreter reviewed:: Yes        Need for Family Participation in Patient Care: Yes (Comment) Care giver support system in place?: Yes (comment)   Criminal Activity/Legal Involvement Pertinent to Current Situation/Hospitalization: No - Comment as needed  Activities of Daily Living      Permission Sought/Granted Permission sought to share information with : Case Manager, Family Supports, PCP Permission granted to share information with : Yes, Verbal Permission Granted  Share Information with NAME: Janan Ridge     Permission granted to share info w Relationship: daughter  Permission granted to share info w Contact Information: [phone]  Emotional Assessment Appearance:: Appears stated age Attitude/Demeanor/Rapport: Lethargic Affect (typically observed): Accepting Orientation: : Oriented to Self, Oriented to Place, Oriented to  Time, Oriented to Situation Alcohol / Substance Use: Not Applicable Psych Involvement: No (comment)  Admission diagnosis:  Cardiac arrest (HCC)  [I46.9] Acidemia [E87.20] Shock (HCC) [R57.9] Upper GI bleed [K92.2] AKI (acute kidney injury) (HCC) [N17.9] Patient Active Problem List   Diagnosis Date Noted   Ileus (HCC) 02/09/2023   C. 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   Cardiac arrest (HCC) 01/27/2023   Acute respiratory failure with hypoxia (HCC) 01/27/2023   Upper GI bleed 01/27/2023   GI bleed 01/15/2022   Rectal bleeding 01/12/2022   Acute deep vein thrombosis (DVT) of brachial vein of right upper extremity (HCC)    Enteritis due to Clostridium difficile 12/24/2021   Proctocolitis 12/23/2021   UTI (urinary tract infection) 12/23/2021   Thrombocytosis 10/02/2021   Gait instability 10/02/2021   Hypokalemia 10/01/2021   Skin test reaction, systemic 09/07/2021   N&V (nausea and vomiting) 04/30/2021   Abdominal pain    Bloating    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HCC)    Hematochezia 02/04/2021   Crohn's disease of large intestine with other complication (HCC) 02/03/2021   Diarrhea    Metabolic encephalopathy 01/20/2021   Moderate Pericardial effusion 01/19/2021   Right foot pain    AKI (acute kidney injury) (HCC) 01/16/2021   Rhabdomyolysis 01/16/2021   Lactic acidosis 01/16/2021   COVID-19 virus infection 01/16/2021   Shock (HCC) 01/16/2021   Seizure (HCC) 06/24/2020   MDD (major depressive disorder) 03/24/2020   Chest pain  02/07/2020   Chronic patellofemoral pain of both knees 01/11/2020   Essential hypertension 06/17/2018   Insomnia 12/13/2017   Neuropathy 12/13/2017   Chest pain at rest 05/09/2016   COPD (chronic obstructive pulmonary disease) (HCC)    Crohn's disease with complication (HCC)    Diabetes mellitus without complication (HCC)    Anxiety    Cigarette smoker    PTSD (post-traumatic stress disorder)    Palpitation    Constipation 09/20/2012   Head injury 05/09/2012    Hyperlipidemia 04/11/2012   DJD (degenerative joint disease), lumbar 04/04/2012   Paroxysmal SVT (supraventricular tachycardia) (HCC) 04/04/2012   Tobacco dependence 04/04/2012   DOE (dyspnea on exertion) 01/26/2012   Paresthesia 11/25/2011   Abdominal pain, chronic, epigastric 11/01/2011   Abdominal pain, acute, periumbilical 11/01/2011   GERD (gastroesophageal reflux disease)    Hyperglycemia    Chronic low back pain    IBS (irritable bowel syndrome)    Closed head injury    TOBACCO ABUSE 07/13/2010   Palpitations 07/13/2010   Pleuritic chest pain 07/13/2010   Anxiety state 07/03/2008   BACK PAIN, LUMBAR, CHRONIC 07/03/2008   PCP:  [name], PA-C Pharmacy:   [id number] - [place], [place] - [address] [place] [address] Phone: [phone] Fax: [phone]  [facility] - [place], [place] - [address] [place] [address] Phone: [phone] Fax: [phone]  [facility] [address] [place] - [address] [place] [address] Phone: [phone] Fax: [phone]  [facility] [address], [place] - [address] [place] [address]  [place] [address] Phone: [phone] Fax: [phone]     Social Determinants of Health (SDOH) Social History: SDOH Screenings   Food Insecurity: Food Insecurity Present (10/18/2022)   Received from [facility]  Housing: Low Risk  (01/16/2022)  Transportation Needs: Unmet Transportation Needs (10/18/2022)   Received from [facility]  Utilities: Not At Risk (10/18/2022)   Received from [facility]  Financial Resource Strain: Medium Risk (10/18/2022)   Received from [facility]  Social Connections: Unknown (09/01/2021)   Received from [facility], [facility]  Stress: Stress Concern Present (06/25/2020)   Received from [facility], [facility]  Tobacco Use: High Risk (02/11/2023)   SDOH Interventions:     Readmission Risk  Interventions     No data to display

## 2023-02-14 NOTE — Progress Notes (Addendum)
Speech Language Pathology Treatment: Dysphagia;Cognitive-Linquistic  Patient Details Name: [name] MRN: [id number] DOB: 05/24/1970 Today's Date: 02/14/2023 Time: 4098-1191 SLP Time Calculation (min) (ACUTE ONLY): 19 min  Assessment / Plan / Recommendation Clinical Impression  Since therapist last saw pt she underwent total abdominal colectomy with ileostomy on 10/11 became tachycardic, hypotensive and transferred to ICU. Pt seen for cognition and dysphagia. She is now on clear liquids per GI. Observed with sips water via straw and had one immediate cough stating "it went down the wrong way but that doesn't usually happen." Pt had been tolerating thins without s/s aspiration- possibly due to short intubation during surgery. Tolerated bites of jello without difficulty. Will continue to see briefly for continued safety with liquids. GI MD will upgrade as tolerated. Do not suspect she will have difficulty masticating once upgraded.  She was able to state situation and that she had part of her intestines removes and has a "tube" and aware she had changed units but needed min cues as to reason. She recalled what she did in therapy earlier today. She read and answered questions from an activity sheet with 100% accuracy. She was able to answer questions from a mock pill box presentation/sheet with 90% accuracy. Pt is making good progress towards goals and may be nearing meeting her goals for cognition.   HPI HPI: 52 yo female admitted 9/26 after found down for an unspecified amount of time with possible GIB, agonal breathing and pulseless with CPR 5 min. Intubated in [facility] ED and transferred to [facility]. Pt with AKI, elevated lactate, septic shock. 10/2 extubated and EGD. On 10/11 underwent total abdominal colectomy with ileostomy. 10/13 became tachycardic and hypotensive.  More lethargic. Transferred back to ICU. PMhx: COPD, chron's, HTN, DM, PTSD, seizure disorder, chronic back pain      SLP Plan  Continue  with current plan of care      Recommendations for follow up therapy are one component of a multi-disciplinary discharge planning process, led by the attending physician.  Recommendations may be updated based on patient status, additional functional criteria and insurance authorization.    Recommendations  Diet recommendations: Other(comment);Thin liquid (clears per GI) Liquids provided via: Cup;Straw Medication Administration: Whole meds with puree Supervision: Patient able to self feed Compensations: Slow rate;Small sips/bites Postural Changes and/or Swallow Maneuvers: Seated upright 90 degrees                  Oral care BID   Intermittent Supervision/Assistance Cognitive communication deficit (R41.841);Dysphagia, unspecified (R13.10)     Continue with current plan of care     Royce Macadamia  02/14/2023, 2:26 PM

## 2023-02-14 NOTE — Progress Notes (Signed)
Progress Note  3 Days Post-Op  Subjective: Patient reports abdominal pain, improved with medications. Took some sips of water and did ok with that. Having ostomy output. Getting ready to get up with therapy.   Objective: Vital signs in last 24 hours: Temp:  [97.7 F (36.5 C)-99.9 F (37.7 C)] 97.8 F (36.6 C) (10/14 0810) Pulse Rate:  [129-145] 142 (10/14 0800) Resp:  [19-37] 20 (10/14 0800) BP: (106-137)/(45-66) 108/60 (10/14 0800) SpO2:  [92 %-99 %] 98 % (10/14 0800) Weight:  [65.4 kg] 65.4 kg (10/14 0400) Last BM Date : 02/13/23  Intake/Output from previous day: 10/13 0701 - 10/14 0700 In: 4641 [P.O.:120; I.V.:3481.5; NG/GT:190; IV Piggyback:849.5] Out: 3450 [Urine:2300; Drains:175; Stool:975] Intake/Output this shift: Total I/O In: 129.1 [I.V.:41.1; IV Piggyback:88] Out: 600 [Urine:200; Stool:400]  PE: General: pleasant, WD, WN female who is laying in bed in NAD Heart: sinus tachycardia in the 130-140s Lungs: Respiratory effort nonlabored Abd: soft, appropriately ttp, VAC to midline, ostomy pink with scant output, appliance just changed, drain is cloudy serous, NGT clamped, ND   Lab Results:  Recent Labs    02/13/23 0337 02/14/23 0244  WBC 36.1* 35.0*  HGB 10.6* 10.1*  HCT 31.7* 31.5*  PLT 670* 611*   BMET Recent Labs    02/13/23 0337 02/14/23 0244  NA 135 138  K 3.9 3.1*  CL 102 108  CO2 20* 18*  GLUCOSE 80 90  BUN <5* <5*  CREATININE 0.62 0.35*  CALCIUM 8.2* 8.0*   PT/INR No results for input(s): "LABPROT", "INR" in the last 72 hours. CMP     Component Value Date/Time   NA 138 02/14/2023 0244   K 3.1 (L) 02/14/2023 0244   CL 108 02/14/2023 0244   CO2 18 (L) 02/14/2023 0244   GLUCOSE 90 02/14/2023 0244   BUN <5 (L) 02/14/2023 0244   CREATININE 0.35 (L) 02/14/2023 0244   CREATININE 0.68 06/09/2021 1305   CALCIUM 8.0 (L) 02/14/2023 0244   PROT 4.9 (L) 02/14/2023 0244   ALBUMIN 2.6 (L) 02/14/2023 0244   AST 14 (L) 02/14/2023 0244   ALT  10 02/14/2023 0244   ALKPHOS 60 02/14/2023 0244   BILITOT 0.3 02/14/2023 0244   GFRNONAA >60 02/14/2023 0244   GFRAA >60 07/30/2019 1301   Lipase     Component Value Date/Time   LIPASE 27 07/09/2022 1313       Studies/Results: DG Abd 1 View  Result Date: 02/13/2023 CLINICAL DATA:  Nasogastric tube EXAM: ABDOMEN - 1 VIEW COMPARISON:  Abdominal x-ray 02/10/2023 FINDINGS: Nasogastric tube tip is near the gastric antrum. There are dilated small bowel loops measuring up to 4 cm, new from prior. Cholecystectomy clips are present. Multiple catheters overlie chest and abdomen. IMPRESSION: 1. Nasogastric tube tip is near the gastric antrum. 2. New dilated small bowel loops worrisome for small bowel obstruction. This can be further evaluated with CT. Electronically Signed   By: Darliss Cheney M.D.   On: 02/13/2023 01:55    Anti-infectives: Anti-infectives (From admission, onward)    Start     Dose/Rate Route Frequency Ordered Stop   02/11/23 2000  cefTRIAXone (ROCEPHIN) 2 g in sodium chloride 0.9 % 100 mL IVPB       Note to Pharmacy: Pharmacy may adjust dosing strength / duration / interval for maximal efficacy   2 g 200 mL/hr over 30 Minutes Intravenous Every 24 hours 02/11/23 1622     02/10/23 1600  metroNIDAZOLE (FLAGYL) IVPB 500 mg  500 mg 100 mL/hr over 60 Minutes Intravenous Every 12 hours 02/10/23 1444     02/08/23 1800  vancomycin (VANCOCIN) 50 mg/mL oral solution SOLN 500 mg        500 mg Per Tube Every 6 hours 02/08/23 1325 02/12/23 1759   02/03/23 2200  metroNIDAZOLE (FLAGYL) tablet 500 mg        500 mg Per Tube Every 12 hours 02/03/23 1406 02/06/23 2148   02/02/23 1815  vancomycin (VANCOCIN) 50 mg/mL oral solution SOLN 125 mg  Status:  Discontinued        125 mg Per Tube Every 6 hours 02/02/23 1717 02/08/23 1325   02/02/23 1545  vancomycin (VANCOCIN) capsule 125 mg  Status:  Discontinued        125 mg Oral 4 times daily 02/02/23 1530 02/02/23 1717   01/27/23 1430   cefTRIAXone (ROCEPHIN) 2 g in sodium chloride 0.9 % 100 mL IVPB        2 g 200 mL/hr over 30 Minutes Intravenous Every 24 hours 01/27/23 1340 02/05/23 1403   01/27/23 1400  metroNIDAZOLE (FLAGYL) IVPB 500 mg  Status:  Discontinued        500 mg 100 mL/hr over 60 Minutes Intravenous Every 12 hours 01/27/23 1340 02/03/23 1406   01/27/23 0545  vancomycin (VANCOREADY) IVPB 1250 mg/250 mL        1,250 mg 166.7 mL/hr over 90 Minutes Intravenous  Once 01/27/23 0542 01/27/23 0809   01/27/23 0545  ceFEPIme (MAXIPIME) 2 g in sodium chloride 0.9 % 100 mL IVPB        2 g 200 mL/hr over 30 Minutes Intravenous  Once 01/27/23 0542 01/27/23 0621   01/27/23 0545  metroNIDAZOLE (FLAGYL) IVPB 500 mg        500 mg 100 mL/hr over 60 Minutes Intravenous  Once 01/27/23 0542 01/27/23 0741        Assessment/Plan Refractory Crohn's disease 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 Luisa Hart  - remains tachy and WBC 35 from 36 - too early to scan post-op but certainly high risk for post-op abscess given perforation - hgb stable at 10.1 from 10.6 - ileostomy with 974 cc/24 hr - ok to remove NGT and try CLD or start TF, continue to monitor ostomy output closely  - drain with cloudy serous fluid (75cc/24h) - continue medical treatment for C.diff - mobilize - WOC following for new ileostomy and VAC - ok to change VAC M/R  FEN: CLD, IVF per primary  VTE: SQH ID: [email or web address]  - per CCM -  COPD HTN Chronic pain  Collagen vascular disease T2DM Seizure disorder  LOS: 18 days     Juliet Rude, [facility] 02/14/2023, 9:40 AM Please see Amion for pager number during day hours 7:00am-4:30pm

## 2023-02-14 NOTE — Progress Notes (Signed)
[facility] ADULT ICU REPLACEMENT PROTOCOL   The patient does apply for the [facility] Adult ICU Electrolyte Replacment Protocol based on the criteria listed below:   1.Exclusion criteria: TCTS, ECMO, Dialysis, and Myasthenia Gravis patients 2. Is GFR >/= 30 ml/min? Yes.    Patient's GFR today is > 60 3. Is SCr </= 2? Yes.   Patient's SCr is 0.35 mg/dL 4. Did SCr increase >/= 0.5 in 24 hours? No. 5.Pt's weight >40kg  Yes.   6. Abnormal electrolyte(s): K+ 3.1  7. Electrolytes replaced per protocol 8.  Call MD STAT for K+ </= 2.5, Phos </= 1, or Mag </= 1 Physician:  Dr Eather Colas, Jettie Booze 02/14/2023 4:43 AM

## 2023-02-14 NOTE — Progress Notes (Signed)
NAMEMailinh [name], MRN:  [id number], DOB:  April 26, 1971, LOS: 18 ADMISSION DATE:  01/27/2023, CONSULTATION DATE: 01/27/2023 REFERRING MD: EDP, CHIEF COMPLAINT: Postcardiac arrest  History of Present Illness:   This is a 52 year old female with past medical history of COPD, Crohn's on Skyrizi, hypertension, type 2 diabetes mellitus who initially presented to the after being found unresponsive.  Per family, patient has had bloody emesis with diarrhea over the past few days.  Initial systolics in the 60s in the ED, and patient was minimally responsive.  In the emergency room, patient was intubated, and patient was admitted to the ICU for further evaluation and management for shock and altered mental status.  Pertinent  Medical History   Anxiety, asthma, chronic low back pain, Crohn's colitis, GERD type 2 diabetes mellitus, history of seizure disorder  Significant Hospital Events: Including procedures, antibiotic start and stop dates in addition to other pertinent events   01/27/2023: Admitted to ICU and intubated.  Felt to have septic shock of unclear source which resolved antibiotic therapy and fluid resuscitation.  Suspicion was that she may have had C. difficile colitis despite negative toxin.  She was treated with vancomycin. 9/29 Off pressors 10/2: Extubated 10/3 transfer to floor. 10/10 underwent repeat proctosigmoidoscopy for ongoing diarrhea.  Diffuse severe inflammation identified due to combination of Crohn's disease, active C. difficile infection and possible ischemic colitis from prior hypotension on presentation. 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  Intraoperative findings include frank necrosis with pelvic abscess formation and significant colitis. 10/12 some dilated small bowel with minimal OG output.  Continued observation advised.  Having significant postoperative pain. 10/13 became tachycardic and hypotensive.  More lethargic.  Transferred back to  ICU.  Interim History / Subjective:  Uncontrolled pain, 8/10, not much relief from fentanyl boluses. Has lots of medication allergies.   Objective   Blood pressure 108/60, pulse (!) 142, temperature 97.8 F (36.6 C), temperature source Oral, resp. rate 20, height 5' 2.99" (1.6 m), weight 65.4 kg, SpO2 98%.   Intake/Output Summary (Last 24 hours) at 02/14/2023 1025 Last data filed at 02/14/2023 0900 Gross per 24 hour  Intake 3957.24 ml  Output 3575 ml  Net 382.24 ml   Filed Weights   02/11/23 0500 02/12/23 0326 02/14/23 0400  Weight: 61.2 kg 64.2 kg 65.4 kg    Examination: General: ill appearing woman sitting up in the chair HEENT: [place]/AT, eyes anicteric Cardio: S1S2, tachycardic to 150, no murmur Pulmonary: reduced breath sounds Abdomen: soft, NT. Very TTP. Purulent serosanguinous drainage from LLQ drain. Midline wound vac, ostomy with watery biluous output Neuro: awake and alert, moving extremities, globally weak Skin: pallor, no diffuse rashes   I/O +1.2L  Net +3L  Suspect this is not accurate for the admission  K+ 3.1 Bicarb 18 BUN <5 Cr 0.35 WBC 25 H/H 10.1/31.5 Platelets 611   Resolved problem list:    Assessment & Plan:   Sepsis following extensive intra-abdominal surgery with colectomy and abscess drainage.  Extensive colonic necrosis Infectious colitis due to C. difficile on treatment -con't flagyl; needs prolonged course, question if adding enteral vanc is appropriate -maintain euvolemia -worry about abscess; surgery feels it is too soon to glean useful information from CT csan -may need to consider broadening ceftriaxone given perforation and contamination of peritoneal cavity -appreciate surgery's management -adding fentanyl patch for better control; unfortunately has a lot of allergies so no oral opiate options. High risk for NSAIDs with critical illness. Con't gabapentin, robaxin, schedule tylenol.  Sinus tachycardia likely secondary to above as  well as poorly controlled pain. Worsening metabolic acidosis makes this less likely a benign finding. Worry about incomplete source control-- enterococcus not covered with current antibiotic regimen. Resistant GNR not covered, but low risk of pseudomonas. -worry about incomplete source control> discussing with ID pharmacy adding vanc IV to cover enterococcus; wouldn't worsen C diff control  Infectious colitis due to C. difficile on treatment -con't flagyl  Longstanding Crohn's disease; not chronically on steroids so less concern for adrenal insufficiency -hold off on steroids, which could slow healing -holding PTA Skyrizi  At high risk for malnutrition. -advancing diet as tolerated; on clears  -adding thiamine, folic acid given chronic risk for malabsorption with crohn's and C diff.  -hopefully can avoid TPN  Type 2 diabetes -SSI PRN, has not needed much insulin currently -goal BG 140-180  History of seizures -con't PTA keppra -resume PTA gabapentin  History of anxiety -con't ativan 2mg  PRN -likely can soon resume PTA anxiety meds- Cymbalta, atarax  History of hypertension -holding PTA antihypertensives due to concern for uncontrolled sepsis  History of COPD -con't xopenex PRN -switch PTA spiriva to yupelri for now  GERD -con't PTA PPI; may make C diff treatment less successful  Anemia  -transfuse for Hb <7 or hemodynamically significant bleeding -monitor  Hypokalemia -repleted, monitor  Best Practice (right click and "Reselect all SmartList Selections" daily)   Diet/type: clears, starting trickle TF DVT prophylaxis: Heparin subcu GI prophylaxis: PPI Lines: Peripheral IVs only Foley: d/c foley Code Status:  full code.  Daughter updated 10/2   Steffanie Dunn, DO 02/14/23 11:29 AM [facility]  For contact information, see Amion. If no response to pager, please call PCCM consult pager. After hours, 7PM- 7AM, please call Elink.

## 2023-02-14 NOTE — Progress Notes (Signed)
Inpatient Rehab Admissions Coordinator:   Per therapy recommendations patient was screened for CIR candidacy by Megan Salon, MS, CCC-SLP. At this time, Pt. Appears to be a a potential candidate for CIR. I will place   order for rehab consult per protocol for full assessment. Please contact me any with questions.  Megan Salon, MS, CCC-SLP Rehab Admissions Coordinator  [phone] (celll) [phone] (office)

## 2023-02-15 DIAGNOSIS — E43 Unspecified severe protein-calorie malnutrition: Secondary | ICD-10-CM | POA: Diagnosis not present

## 2023-02-15 DIAGNOSIS — E876 Hypokalemia: Secondary | ICD-10-CM

## 2023-02-15 DIAGNOSIS — A419 Sepsis, unspecified organism: Secondary | ICD-10-CM | POA: Diagnosis present

## 2023-02-15 DIAGNOSIS — L8990 Pressure ulcer of unspecified site, unspecified stage: Secondary | ICD-10-CM | POA: Insufficient documentation

## 2023-02-15 DIAGNOSIS — D62 Acute posthemorrhagic anemia: Secondary | ICD-10-CM | POA: Diagnosis not present

## 2023-02-15 DIAGNOSIS — E871 Hypo-osmolality and hyponatremia: Secondary | ICD-10-CM | POA: Diagnosis not present

## 2023-02-15 LAB — COMPREHENSIVE METABOLIC PANEL
ALT: 10 U/L (ref 0–44)
AST: 13 U/L — ABNORMAL LOW (ref 15–41)
Albumin: 2.4 g/dL — ABNORMAL LOW (ref 3.5–5.0)
Alkaline Phosphatase: 83 U/L (ref 38–126)
Anion gap: 11 (ref 5–15)
BUN: <5 mg/dL — ABNORMAL LOW (ref 6–20)
CO2: 23 mmol/L (ref 22–32)
Calcium: 8.5 mg/dL — ABNORMAL LOW (ref 8.9–10.3)
Chloride: 101 mmol/L (ref 98–111)
Creatinine, Ser: 0.48 mg/dL (ref 0.44–1.00)
GFR, Estimated: >60 mL/min (ref >60)
Glucose, Bld: 115 mg/dL — ABNORMAL HIGH (ref 70–99)
Potassium: 3.3 mmol/L — ABNORMAL LOW (ref 3.5–5.1)
Sodium: 135 mmol/L (ref 135–145)
Total Bilirubin: 0.4 mg/dL (ref 0.3–1.2)
Total Protein: 5.1 g/dL — ABNORMAL LOW (ref 6.5–8.1)

## 2023-02-15 LAB — CBC
HCT: 30.4 % — ABNORMAL LOW (ref 36.0–46.0)
Hemoglobin: 10.1 g/dL — ABNORMAL LOW (ref 12.0–15.0)
MCH: 27.9 pg (ref 26.0–34.0)
MCHC: 33.2 g/dL (ref 30.0–36.0)
MCV: 84 fL (ref 80.0–100.0)
Platelets: 618 10*3/uL — ABNORMAL HIGH (ref 150–400)
RBC: 3.62 MIL/uL — ABNORMAL LOW (ref 3.87–5.11)
RDW: 15.7 % — ABNORMAL HIGH (ref 11.5–15.5)
WBC: 38.9 10*3/uL — ABNORMAL HIGH (ref 4.0–10.5)
nRBC: 0.4 % — ABNORMAL HIGH (ref 0.0–0.2)

## 2023-02-15 LAB — BASIC METABOLIC PANEL
Anion gap: 8 (ref 5–15)
BUN: <5 mg/dL — ABNORMAL LOW (ref 6–20)
CO2: 22 mmol/L (ref 22–32)
Calcium: 8.1 mg/dL — ABNORMAL LOW (ref 8.9–10.3)
Chloride: 103 mmol/L (ref 98–111)
Creatinine, Ser: 0.55 mg/dL (ref 0.44–1.00)
GFR, Estimated: >60 mL/min (ref >60)
Glucose, Bld: 153 mg/dL — ABNORMAL HIGH (ref 70–99)
Potassium: 4 mmol/L (ref 3.5–5.1)
Sodium: 133 mmol/L — ABNORMAL LOW (ref 135–145)

## 2023-02-15 LAB — MAGNESIUM: Magnesium: 1.6 mg/dL — ABNORMAL LOW (ref 1.7–2.4)

## 2023-02-15 LAB — GLUCOSE, CAPILLARY
Glucose-Capillary: 109 mg/dL — ABNORMAL HIGH (ref 70–99)
Glucose-Capillary: 145 mg/dL — ABNORMAL HIGH (ref 70–99)
Glucose-Capillary: 141 mg/dL — ABNORMAL HIGH (ref 70–99)
Glucose-Capillary: 133 mg/dL — ABNORMAL HIGH (ref 70–99)
Glucose-Capillary: 115 mg/dL — ABNORMAL HIGH (ref 70–99)
Glucose-Capillary: 122 mg/dL — ABNORMAL HIGH (ref 70–99)

## 2023-02-15 LAB — SURGICAL PATHOLOGY

## 2023-02-15 MED ORDER — POTASSIUM CHLORIDE 20 MEQ PO PACK
20.0000 meq | PACK | ORAL | Status: AC
  Administered 2023-02-15 (×2): 20 meq
  Filled 2023-02-15 (×2): qty 1

## 2023-02-15 MED ORDER — FERROUS SULFATE 325 (65 FE) MG PO TABS
325.0000 mg | ORAL_TABLET | Freq: Two times a day (BID) | ORAL | Status: DC
  Administered 2023-02-15 – 2023-03-02 (×29): 325 mg via ORAL
  Filled 2023-02-15 (×29): qty 1

## 2023-02-15 MED ORDER — NON FORMULARY: 480.0000 mL | Status: DC

## 2023-02-15 MED ORDER — BANATROL TF EN LIQD
60.0000 mL | Freq: Two times a day (BID) | ENTERAL | Status: DC
  Administered 2023-02-15 – 2023-02-17 (×3): 60 mL
  Filled 2023-02-15 (×3): qty 60

## 2023-02-15 MED ORDER — LACTATED RINGERS IV BOLUS
500.0000 mL | Freq: Once | INTRAVENOUS | Status: AC
  Administered 2023-02-15: 500 mL via INTRAVENOUS

## 2023-02-15 MED ORDER — ALBUMIN HUMAN 25 % IV SOLN
50.0000 g | Freq: Once | INTRAVENOUS | Status: AC
  Administered 2023-02-15: 50 g via INTRAVENOUS
  Filled 2023-02-15: qty 200

## 2023-02-15 MED ORDER — MAGNESIUM SULFATE 4 GM/100ML IV SOLN
4.0000 g | Freq: Once | INTRAVENOUS | Status: AC
  Administered 2023-02-15: 4 g via INTRAVENOUS
  Filled 2023-02-15: qty 100

## 2023-02-15 MED ORDER — HYDROXYZINE HCL 25 MG PO TABS
25.0000 mg | ORAL_TABLET | Freq: Three times a day (TID) | ORAL | Status: DC | PRN
  Administered 2023-02-15 – 2023-02-23 (×6): 25 mg via ORAL
  Filled 2023-02-15 (×6): qty 1

## 2023-02-15 MED ORDER — FREE WATER
50.0000 mL | Status: DC
  Administered 2023-02-15 – 2023-02-17 (×9): 50 mL

## 2023-02-15 MED ORDER — ONDANSETRON HCL 4 MG/2ML IJ SOLN
4.0000 mg | Freq: Four times a day (QID) | INTRAMUSCULAR | Status: DC | PRN
  Administered 2023-02-15 – 2023-03-19 (×15): 4 mg via INTRAVENOUS
  Filled 2023-02-15 (×15): qty 2

## 2023-02-15 MED ORDER — VIVONEX RTF PO LIQD
1000.0000 mL | ORAL | Status: DC
  Administered 2023-02-15 – 2023-02-17 (×3): 1000 mL via ORAL
  Filled 2023-02-15 (×7): qty 1000

## 2023-02-15 MED ORDER — DULOXETINE HCL 30 MG PO CPEP
30.0000 mg | ORAL_CAPSULE | Freq: Every day | ORAL | Status: DC
  Administered 2023-02-15 – 2023-03-19 (×33): 30 mg via ORAL
  Filled 2023-02-15 (×33): qty 1

## 2023-02-15 MED ORDER — CALCIUM POLYCARBOPHIL 625 MG PO TABS
625.0000 mg | ORAL_TABLET | Freq: Two times a day (BID) | ORAL | Status: DC
  Administered 2023-02-15: 625 mg via ORAL
  Filled 2023-02-15 (×2): qty 1

## 2023-02-15 MED ORDER — POTASSIUM CHLORIDE 10 MEQ/100ML IV SOLN
10.0000 meq | INTRAVENOUS | Status: AC
  Administered 2023-02-15 (×4): 10 meq via INTRAVENOUS
  Filled 2023-02-15 (×4): qty 100

## 2023-02-15 MED ORDER — LOPERAMIDE HCL 2 MG PO CAPS
2.0000 mg | ORAL_CAPSULE | Freq: Two times a day (BID) | ORAL | Status: DC
  Administered 2023-02-15 (×2): 2 mg via ORAL
  Filled 2023-02-15 (×2): qty 1

## 2023-02-15 MED ORDER — ACETAMINOPHEN 160 MG/5ML PO SOLN
1000.0000 mg | Freq: Four times a day (QID) | ORAL | Status: DC
  Administered 2023-02-15 – 2023-02-18 (×11): 1000 mg
  Filled 2023-02-15 (×12): qty 40.6

## 2023-02-15 NOTE — Progress Notes (Signed)
eLink Physician-Brief Progress Note Patient Name: [name] DOB: 01-05-1971 MRN: [id number]   Date of Service  02/15/2023  HPI/Events of Note  Notified of tachyarrhythmia.  Pt has been in the 130s-140s but had a transient burst into the 190s.   On camera assessment, pt is awake and alert and appears calm.  BP 105/73, HR 137, RR 21, O2 sats 94%.   Imaging during the day was concerning for SBO.  Pt has been tolerating trickle feeds.   eICU Interventions  Check BMP and replete electrolytes as needed.  Continue trickle feeds.      Intervention Category Intermediate Interventions: Arrhythmia - evaluation and management  [name] 02/15/2023, 3:05 AM

## 2023-02-15 NOTE — Progress Notes (Signed)
NAMETaranika [name], MRN:  [id number], DOB:  1970/10/08, LOS: 19 ADMISSION DATE:  01/27/2023, CONSULTATION DATE: 01/27/2023 REFERRING MD: EDP, CHIEF COMPLAINT: Postcardiac arrest  History of Present Illness:   This is a 52 year old female with past medical history of COPD, Crohn's on Skyrizi, hypertension, type 2 diabetes mellitus who initially presented to the after being found unresponsive.  Per family, patient has had bloody emesis with diarrhea over the past few days.  Initial systolics in the 60s in the ED, and patient was minimally responsive.  In the emergency room, patient was intubated, and patient was admitted to the ICU for further evaluation and management for shock and altered mental status.  Pertinent  Medical History   Anxiety, asthma, chronic low back pain, Crohn's colitis, GERD type 2 diabetes mellitus, history of seizure disorder  Significant Hospital Events: Including procedures, antibiotic start and stop dates in addition to other pertinent events   01/27/2023: Admitted to ICU and intubated.  Felt to have septic shock of unclear source which resolved antibiotic therapy and fluid resuscitation.  Suspicion was that she may have had C. difficile colitis despite negative toxin.  She was treated with vancomycin. 9/29 Off pressors 10/2: Extubated 10/3 transfer to floor. 10/10 underwent repeat proctosigmoidoscopy for ongoing diarrhea.  Diffuse severe inflammation identified due to combination of Crohn's disease, active C. difficile infection and possible ischemic colitis from prior hypotension on presentation. 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  Intraoperative findings include frank necrosis with pelvic abscess formation and significant colitis. 10/12 some dilated small bowel with minimal OG output.  Continued observation advised.  Having significant postoperative pain. 10/13 became tachycardic and hypotensive.  More lethargic.  Transferred back to  ICU.  Interim History / Subjective:  States pain more controlled Tolerating po K and mag low; both repleted  Objective   Blood pressure (!) 111/54, pulse (!) 128, temperature 98 F (36.7 C), temperature source Oral, resp. rate (!) 26, height 5' 2.99" (1.6 m), weight 64 kg, SpO2 94%.   Intake/Output Summary (Last 24 hours) at 02/15/2023 0910 Last data filed at 02/15/2023 0900 Gross per 24 hour  Intake 2845.88 ml  Output 4365 ml  Net -1519.12 ml   Filed Weights   02/12/23 0326 02/14/23 0400 02/15/23 0616  Weight: 64.2 kg 65.4 kg 64 kg    Examination: General: ill appearing in NAD HEENT: MM pink/moist; ETT in place Neuro: sedate; cough/gag presents; pupils equal 2mm sluggish CV: s1s2, tachy 110s, no m/r/g PULM:  dim clear BS bilaterally; on mech vent PRVC GI: soft, bsx4 active  Extremities: warm/dry, no edema  Skin: no rashes or lesions   Resolved problem list:    Assessment & Plan:   Sepsis following extensive intra-abdominal surgery with colectomy and abscess drainage.  Extensive colonic necrosis Refractory Crohn's disease Infectious colitis due to C. difficile on treatment Plan: -CCS following; appreciate recs -wbc cont to trend up; cont flagyl, cefepime; vanc added today -tolerating PO; advance diet as tolerated -trend wbc/fever curve -wound/ileostomy care per surgery and woc -cont current pain management -possible CT tomorrow -skyrizi and entocort on hold  Sinus tachycardia likely secondary to above as well as poorly controlled pain. Worsening metabolic acidosis makes this less likely a benign finding. Worry about incomplete source control-- enterococcus not covered with current antibiotic regimen. Resistant GNR not covered, but low risk of pseudomonas. Plan: -pain better managed -broadening antibiotics -cont to advance po; likely dehydration; will give gentle iv fluids  Hypokalemia Hypomagnesemia Plan: -both repleted this  am -trend bmp and mag  At  high risk for malnutrition. Plan: -advance diet as tolerated per surgery -thiamine, folic acid -advancing diet as tolerated; on clears   Type 2 diabetes Plan: -ssi and cbg monitoring  History of seizures Plan: -home keppra and gabapentin  History of anxiety Plan: -prn ativan -cont home elavil -resume cymbalata and prn hydroxyzine  History of hypertension Plan: -hold metoprolol given soft bp  History of COPD Plan: -yupelri -prn xopenex  GERD Plan: -ppi  Anemia  Plan: -trend cbc   Best Practice (right click and "Reselect all SmartList Selections" daily)   Diet/type: advance as tolerated DVT prophylaxis: Heparin subcu GI prophylaxis: PPI Lines: Peripheral IVs only Foley: na Code Status:  full code.  10/15 patient updated at bedside   JD Daryel [facility] 02/15/2023, 9:39 AM  Please see [email or web address] for pager details.  From 7A-7P if no response, please call [phone]. After hours, please call ELink [phone].

## 2023-02-15 NOTE — Progress Notes (Signed)
Progress Note  4 Days Post-Op  Subjective: Reports that her pain has improved somewhat.  Tolerated CLD without additional pain. Remains tachy but HR 120s this morning down from 140s yesterday.    Objective: Vital signs in last 24 hours: Temp:  [98 F (36.7 C)-98.9 F (37.2 C)] 98 F (36.7 C) (10/15 0400) Pulse Rate:  [132-150] 132 (10/15 0700) Resp:  [19-46] 24 (10/15 0700) BP: (91-139)/(48-95) 104/60 (10/15 0700) SpO2:  [93 %-98 %] 94 % (10/15 0700) Weight:  [64 kg] 64 kg (10/15 0616) Last BM Date : 02/14/23  Intake/Output from previous day: 10/14 0701 - 10/15 0700 In: 2782.5 [P.O.:720; I.V.:588; NG/GT:307; IV Piggyback:1167.5] Out: 4615 [Urine:1000; Drains:315; Stool:3300] Intake/Output this shift: Total I/O In: -  Out: 350 [Stool:350]  PE: General: pleasant, WD, WN female who is laying in bed in NAD Heart: sinus tachycardia in the 120s Lungs: Respiratory effort nonlabored Abd: soft, appropriately ttp, VAC to midline, ostomy pink with thin dark output, drain cloudy   Lab Results:  Recent Labs    02/14/23 0244 02/15/23 0246  WBC 35.0* 38.9*  HGB 10.1* 10.1*  HCT 31.5* 30.4*  PLT 611* 618*   BMET Recent Labs    02/14/23 0244 02/15/23 0246  NA 138 135  K 3.1* 3.3*  CL 108 101  CO2 18* 23  GLUCOSE 90 115*  BUN <5* <5*  CREATININE 0.35* 0.48  CALCIUM 8.0* 8.5*   PT/INR No results for input(s): "LABPROT", "INR" in the last 72 hours. CMP     Component Value Date/Time   NA 135 02/15/2023 0246   K 3.3 (L) 02/15/2023 0246   CL 101 02/15/2023 0246   CO2 23 02/15/2023 0246   GLUCOSE 115 (H) 02/15/2023 0246   BUN <5 (L) 02/15/2023 0246   CREATININE 0.48 02/15/2023 0246   CREATININE 0.68 06/09/2021 1305   CALCIUM 8.5 (L) 02/15/2023 0246   PROT 5.1 (L) 02/15/2023 0246   ALBUMIN 2.4 (L) 02/15/2023 0246   AST 13 (L) 02/15/2023 0246   ALT 10 02/15/2023 0246   ALKPHOS 83 02/15/2023 0246   BILITOT 0.4 02/15/2023 0246   GFRNONAA >60 02/15/2023 0246    GFRAA >60 07/30/2019 1301   Lipase     Component Value Date/Time   LIPASE 27 07/09/2022 1313       Studies/Results: No results found.  Anti-infectives: Anti-infectives (From admission, onward)    Start     Dose/Rate Route Frequency Ordered Stop   02/15/23 1300  vancomycin (VANCOREADY) IVPB 750 mg/150 mL        750 mg 150 mL/hr over 60 Minutes Intravenous Every 12 hours 02/14/23 1156     02/14/23 1130  vancomycin (VANCOCIN) IVPB 1000 mg/200 mL premix        1,000 mg 200 mL/hr over 60 Minutes Intravenous  Once 02/14/23 1043 02/14/23 1249   02/11/23 2000  cefTRIAXone (ROCEPHIN) 2 g in sodium chloride 0.9 % 100 mL IVPB       Note to Pharmacy: Pharmacy may adjust dosing strength / duration / interval for maximal efficacy   2 g 200 mL/hr over 30 Minutes Intravenous Every 24 hours 02/11/23 1622     02/10/23 1600  metroNIDAZOLE (FLAGYL) IVPB 500 mg        500 mg 100 mL/hr over 60 Minutes Intravenous Every 12 hours 02/10/23 1444     02/08/23 1800  vancomycin (VANCOCIN) 50 mg/mL oral solution SOLN 500 mg        500 mg Per Tube Every  6 hours 02/08/23 1325 02/12/23 1759   02/03/23 2200  metroNIDAZOLE (FLAGYL) tablet 500 mg        500 mg Per Tube Every 12 hours 02/03/23 1406 02/06/23 2148   02/02/23 1815  vancomycin (VANCOCIN) 50 mg/mL oral solution SOLN 125 mg  Status:  Discontinued        125 mg Per Tube Every 6 hours 02/02/23 1717 02/08/23 1325   02/02/23 1545  vancomycin (VANCOCIN) capsule 125 mg  Status:  Discontinued        125 mg Oral 4 times daily 02/02/23 1530 02/02/23 1717   01/27/23 1430  cefTRIAXone (ROCEPHIN) 2 g in sodium chloride 0.9 % 100 mL IVPB        2 g 200 mL/hr over 30 Minutes Intravenous Every 24 hours 01/27/23 1340 02/05/23 1403   01/27/23 1400  metroNIDAZOLE (FLAGYL) IVPB 500 mg  Status:  Discontinued        500 mg 100 mL/hr over 60 Minutes Intravenous Every 12 hours 01/27/23 1340 02/03/23 1406   01/27/23 0545  vancomycin (VANCOREADY) IVPB 1250 mg/250 mL         1,250 mg 166.7 mL/hr over 90 Minutes Intravenous  Once 01/27/23 0542 01/27/23 0809   01/27/23 0545  ceFEPIme (MAXIPIME) 2 g in sodium chloride 0.9 % 100 mL IVPB        2 g 200 mL/hr over 30 Minutes Intravenous  Once 01/27/23 0542 01/27/23 0621   01/27/23 0545  metroNIDAZOLE (FLAGYL) IVPB 500 mg        500 mg 100 mL/hr over 60 Minutes Intravenous  Once 01/27/23 0542 01/27/23 0741        Assessment/Plan Refractory Crohn's disease Fulminant C. Diff colitis with necrosis of proximal rectum and sigmoid colon, perforation of proximal rectum, large pelvic abscess  POD 4 s/p TAC and end ileostomy Dr. Luisa Hart  - Leukocytosis uptrending to 38 today but she reports feeling better and has tolerated PO with bowel function - Tentative plan for CT tomorrow but may consider this afternoon if she develops fevers or has any clinical change - Okay to advance diet as tolerated - Continue drain - continue medical treatment for C.diff - mobilize - WOC following for new ileostomy and VAC - ok to change VAC M/R  FEN: ADAT, TF and IVF per primary  VTE: SQH ID: [email or web address]  - per CCM -  COPD HTN Chronic pain  Collagen vascular disease T2DM Seizure disorder  LOS: 19 days     Moise Boring, MD [facility] Surgery 02/15/2023, 8:29 AM Please see Amion for pager number during day hours 7:00am-4:30pm

## 2023-02-15 NOTE — Progress Notes (Signed)
Inpatient Rehab Admissions Coordinator:    I spoke with pt.'s daughter Joice Lofts regarding potential CIR admit. She states that she cannot provide care at d/c from CIR and she is Pt.'s only family that Pt. Is in touch with. She would like SNF placement. I will notify TOC.  Megan Salon, MS, CCC-SLP Rehab Admissions Coordinator  [phone] (celll) [phone] (office)

## 2023-02-15 NOTE — Progress Notes (Signed)
[facility] ADULT ICU REPLACEMENT PROTOCOL   The patient does apply for the [facility] Adult ICU Electrolyte Replacment Protocol based on the criteria listed below:   1.Exclusion criteria: TCTS, ECMO, Dialysis, and Myasthenia Gravis patients 2. Is GFR >/= 30 ml/min? Yes.    Patient's GFR today is >60 3. Is SCr </= 2? Yes.   Patient's SCr is 0.48  mg/dL 4. Did SCr increase >/= 0.5 in 24 hours? No. 5.Pt's weight >40kg  Yes.   6. Abnormal electrolyte(s): k 3.3, mag 1.6  7. Electrolytes replaced per protocol 8.  Call MD STAT for K+ </= 2.5, Phos </= 1, or Mag </= 1 Physician:    Markus Daft A 02/15/2023 3:40 AM

## 2023-02-15 NOTE — Plan of Care (Signed)
  Problem: Clinical Measurements: Goal: Ability to maintain clinical measurements within normal limits will improve Outcome: Progressing   

## 2023-02-15 NOTE — TOC Initial Note (Signed)
Transition of Care [facility]) - Initial/Assessment Note    Patient Details  Name: [name] MRN: [id number] Date of Birth: [birth year]  [facility] CM/SW Contact:    Delilah Shan, LCSWA Phone Number: 02/15/2023, 2:28 PM  Clinical Narrative:                  CSW spoke with patient regarding PT recommendations. Patient expressed understanding of PT recommendation and is agreeable to SNF placement at time of discharge. Patient reports PTA she comes from home with roommate.Patient gave CSW permission to fax out initial referral for possible SNF placement. CSW informed patient that CSW will fax out referral closer to patient being medically ready for dc.CSW discussed insurance authorization process and will provide Medicare SNF ratings list with accepted SNF bed offers when available.  No further questions reported at this time. CSW to continue to follow and assist with discharge planning needs.  Expected Discharge Plan: Skilled Nursing Facility Barriers to Discharge: Continued Medical Work up   Patient Goals and CMS Choice Patient states their goals for this hospitalization and ongoing recovery are:: SNF   Choice offered to / list presented to : Patient      Expected Discharge Plan and Services In-house Referral: Clinical Social Work Discharge Planning Services: CM Consult   Living arrangements for the past 2 months: Single Family Home                                      Prior Living Arrangements/Services Living arrangements for the past 2 months: Single Family Home Lives with:: Roommate Patient language and need for interpreter reviewed:: Yes Do you feel safe going back to the place where you live?: No   SNF  Need for Family Participation in Patient Care: Yes (Comment) Care giver support system in place?: Yes (comment)   Criminal Activity/Legal Involvement Pertinent to Current Situation/Hospitalization: No - Comment as needed  Activities of Daily  Living      Permission Sought/Granted Permission sought to share information with : Case Manager, Magazine features editor, Family Supports Permission granted to share information with : Yes, Verbal Permission Granted  Share Information with NAME: Geophysical data processor granted to share info w AGENCY: SNF  Permission granted to share info w Relationship: daughter  Permission granted to share info w Contact Information: Joice Lofts [phone]  Emotional Assessment Appearance:: Appears stated age Attitude/Demeanor/Rapport: Gracious Affect (typically observed): Calm Orientation: : Oriented to Self, Oriented to Place, Oriented to  Time, Oriented to Situation Alcohol / Substance Use: Not Applicable Psych Involvement: No (comment)  Admission diagnosis:  Cardiac arrest (HCC) [I46.9] Acidemia [E87.20] Shock (HCC) [R57.9] Upper GI bleed [K92.2] AKI (acute kidney injury) (HCC) [N17.9] Patient Active Problem List   Diagnosis Date Noted   Sepsis (HCC) 02/15/2023   Pressure injury of skin 02/15/2023   Ileus (HCC) 02/09/2023   C. 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   Cardiac arrest (HCC) 01/27/2023   Acute respiratory failure with hypoxia (HCC) 01/27/2023   Upper GI bleed 01/27/2023   GI bleed 01/15/2022   Rectal bleeding 01/12/2022   Acute deep vein thrombosis (DVT) of brachial vein of right upper extremity (HCC)    Enteritis due to Clostridium difficile 12/24/2021   Proctocolitis 12/23/2021   UTI (urinary tract infection) 12/23/2021   Thrombocytosis 10/02/2021  Gait instability 10/02/2021   Hypokalemia 10/01/2021   Skin test reaction, systemic 09/07/2021   N&V (nausea and vomiting) 04/30/2021   Abdominal pain    Bloating    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HCC)    Hematochezia 02/04/2021   Crohn's disease of large intestine with other complication (HCC)  02/03/2021   Diarrhea    Metabolic encephalopathy 01/20/2021   Moderate Pericardial effusion 01/19/2021   Right foot pain    AKI (acute kidney injury) (HCC) 01/16/2021   Rhabdomyolysis 01/16/2021   Lactic acidosis 01/16/2021   COVID-19 virus infection 01/16/2021   Shock (HCC) 01/16/2021   Seizure (HCC) 06/24/2020   MDD (major depressive disorder) 03/24/2020   Chest pain 02/07/2020   Chronic patellofemoral pain of both knees 01/11/2020   Essential hypertension 06/17/2018   Insomnia 12/13/2017   Neuropathy 12/13/2017   Chest pain at rest 05/09/2016   COPD (chronic obstructive pulmonary disease) (HCC)    Crohn's disease with complication (HCC)    Diabetes mellitus without complication (HCC)    Anxiety    Cigarette smoker    PTSD (post-traumatic stress disorder)    Palpitation    Constipation 09/20/2012   Head injury 05/09/2012   Hyperlipidemia 04/11/2012   DJD (degenerative joint disease), lumbar 04/04/2012   Paroxysmal SVT (supraventricular tachycardia) (HCC) 04/04/2012   Tobacco dependence 04/04/2012   DOE (dyspnea on exertion) 01/26/2012   Paresthesia 11/25/2011   Abdominal pain, chronic, epigastric 11/01/2011   Abdominal pain, acute, periumbilical 11/01/2011   GERD (gastroesophageal reflux disease)    Hyperglycemia    Chronic low back pain    IBS (irritable bowel syndrome)    Closed head injury    TOBACCO ABUSE 07/13/2010   Palpitations 07/13/2010   Pleuritic chest pain 07/13/2010   Anxiety state 07/03/2008   BACK PAIN, LUMBAR, CHRONIC 07/03/2008   PCP:  [name], PA-C Pharmacy:   [id number] - [place], [place] - [address] [place] [address] Phone: [phone] Fax: [phone]  [facility] - [place], [place] - [address] [place] [address] Phone: [phone] Fax: [phone]  [facility] [address] [place] - [address] [place] [address] Phone: [phone] Fax:  [phone]  [facility] [address], [place] - [address] [place] [address] [place] [address] Phone: [phone] Fax: [phone]     Social Determinants of Health (SDOH) Social History: SDOH Screenings   Food Insecurity: Food Insecurity Present (10/18/2022)   Received from [facility]  Housing: Low Risk  (01/16/2022)  Transportation Needs: Unmet Transportation Needs (10/18/2022)   Received from [facility]  Utilities: Not At Risk (10/18/2022)   Received from [facility]  Financial Resource Strain: Medium Risk (10/18/2022)   Received from [facility]  Social Connections: Unknown (09/01/2021)   Received from [facility], [facility]  Stress: Stress Concern Present (06/25/2020)   Received from [facility], [facility]  Tobacco Use: High Risk (02/11/2023)   SDOH Interventions:     Readmission Risk Interventions     No data to display

## 2023-02-15 NOTE — Progress Notes (Signed)
Brief Nutrition Follow-up:  Trickle TF of Vital AF 1.2 infusing at 20 ml/hr via Cortrak; pt taking small amounts of CL. +nausea, no vomiting.   Significant output from Ileostomy with 3.3 L in 24 hours, 1.4 L thus far today. Currently only taking small amounts of po CL and only trickle TF. Concern for significant maldigestion/malabsorption  Noted pt requiring IV fluid boluses to account for high GI losses and volume depletion  Interventions: 1) Trial elemental formula, Vivonex RTF, at trickle relate to see if this assists with suspected maldigestion/malabsorption and thus high ileostomy output 2) Change Fiber Con to Banatrol TF BID (each packet provides 5g of soluble fiber as stool bulking agent). Fiber Con is ordered by mouth but pt has not been taking her meds orally and RN has been giving meds per tube, FiberCon is not recommended per tube due to clogging risk 3) Pt requiring daily IV fluid boluses; recommend initiation of Clinimix TPN to meet increased fluid and electrolyte requirements with high GI losses but also provide additional nutrition   Romelle Starcher MS, RDN, LDN, CNSC Registered Dietitian 3 Clinical Nutrition RD Pager and On-Call Pager Number Located in [place]

## 2023-02-15 NOTE — Progress Notes (Signed)
Inpatient Rehab Admissions Coordinator:   I met with Pt. To discuss potential CIR admit. She states interest but is not sure family can provide 24/7 support. She asks that I call her daughter to discuss further.   Megan Salon, MS, CCC-SLP Rehab Admissions Coordinator  [phone] (celll) [phone] (office)

## 2023-02-16 ENCOUNTER — Inpatient Hospital Stay (HOSPITAL_COMMUNITY): Payer: Medicaid Other

## 2023-02-16 ENCOUNTER — Other Ambulatory Visit: Payer: Self-pay

## 2023-02-16 DIAGNOSIS — A0472 Enterocolitis due to Clostridium difficile, not specified as recurrent: Secondary | ICD-10-CM | POA: Diagnosis not present

## 2023-02-16 DIAGNOSIS — B377 Candidal sepsis: Secondary | ICD-10-CM | POA: Diagnosis not present

## 2023-02-16 DIAGNOSIS — I469 Cardiac arrest, cause unspecified: Secondary | ICD-10-CM | POA: Diagnosis not present

## 2023-02-16 LAB — BLOOD CULTURE ID PANEL (REFLEXED) - BCID2

## 2023-02-16 LAB — GLUCOSE, CAPILLARY
Glucose-Capillary: 116 mg/dL — ABNORMAL HIGH (ref 70–99)
Glucose-Capillary: 129 mg/dL — ABNORMAL HIGH (ref 70–99)
Glucose-Capillary: 132 mg/dL — ABNORMAL HIGH (ref 70–99)
Glucose-Capillary: 90 mg/dL (ref 70–99)
Glucose-Capillary: 96 mg/dL (ref 70–99)

## 2023-02-16 LAB — COMPREHENSIVE METABOLIC PANEL
ALT: 8 U/L (ref 0–44)
AST: 14 U/L — ABNORMAL LOW (ref 15–41)
Albumin: 2.9 g/dL — ABNORMAL LOW (ref 3.5–5.0)
Alkaline Phosphatase: 135 U/L — ABNORMAL HIGH (ref 38–126)
Anion gap: 12 (ref 5–15)
BUN: <5 mg/dL — ABNORMAL LOW (ref 6–20)
CO2: 25 mmol/L (ref 22–32)
Calcium: 8.9 mg/dL (ref 8.9–10.3)
Chloride: 101 mmol/L (ref 98–111)
Creatinine, Ser: 0.48 mg/dL (ref 0.44–1.00)
GFR, Estimated: >60 mL/min (ref >60)
Glucose, Bld: 109 mg/dL — ABNORMAL HIGH (ref 70–99)
Potassium: 3.8 mmol/L (ref 3.5–5.1)
Sodium: 138 mmol/L (ref 135–145)
Total Bilirubin: 0.5 mg/dL (ref 0.3–1.2)
Total Protein: 5.3 g/dL — ABNORMAL LOW (ref 6.5–8.1)

## 2023-02-16 LAB — CBC
HCT: 28.6 % — ABNORMAL LOW (ref 36.0–46.0)
Hemoglobin: 9.4 g/dL — ABNORMAL LOW (ref 12.0–15.0)
MCH: 28.3 pg (ref 26.0–34.0)
MCHC: 32.9 g/dL (ref 30.0–36.0)
MCV: 86.1 fL (ref 80.0–100.0)
Platelets: 609 10*3/uL — ABNORMAL HIGH (ref 150–400)
RBC: 3.32 MIL/uL — ABNORMAL LOW (ref 3.87–5.11)
RDW: 16 % — ABNORMAL HIGH (ref 11.5–15.5)
WBC: 40.8 10*3/uL — ABNORMAL HIGH (ref 4.0–10.5)
nRBC: 0.3 % — ABNORMAL HIGH (ref 0.0–0.2)

## 2023-02-16 LAB — VANCOMYCIN, PEAK: Vancomycin Pk: 19 ug/mL — ABNORMAL LOW (ref 30–40)

## 2023-02-16 LAB — MAGNESIUM: Magnesium: 2 mg/dL (ref 1.7–2.4)

## 2023-02-16 MED ORDER — IOHEXOL 350 MG/ML SOLN
75.0000 mL | Freq: Once | INTRAVENOUS | Status: AC | PRN
  Administered 2023-02-16: 75 mL via INTRAVENOUS

## 2023-02-16 MED ORDER — VANCOMYCIN 50 MG/ML ORAL SOLUTION
125.0000 mg | Freq: Four times a day (QID) | ORAL | Status: DC
  Administered 2023-02-16 – 2023-02-18 (×10): 125 mg
  Filled 2023-02-16 (×12): qty 2.5

## 2023-02-16 MED ORDER — LOPERAMIDE HCL 2 MG PO CAPS
2.0000 mg | ORAL_CAPSULE | Freq: Four times a day (QID) | ORAL | Status: DC
  Administered 2023-02-16 – 2023-02-18 (×9): 2 mg via ORAL
  Filled 2023-02-16 (×8): qty 1

## 2023-02-16 MED ORDER — SODIUM CHLORIDE 0.9 % IV SOLN
100.0000 mg | INTRAVENOUS | Status: AC
  Administered 2023-02-16: 100 mg via INTRAVENOUS
  Filled 2023-02-16: qty 5

## 2023-02-16 MED ORDER — LACTATED RINGERS IV BOLUS
500.0000 mL | Freq: Once | INTRAVENOUS | Status: AC
  Administered 2023-02-16: 500 mL via INTRAVENOUS

## 2023-02-16 MED ORDER — IOHEXOL 9 MG/ML PO SOLN
500.0000 mL | ORAL | Status: AC
  Administered 2023-02-16 (×2): 500 mL via ORAL

## 2023-02-16 MED ORDER — SODIUM CHLORIDE 0.9 % IV SOLN
100.0000 mg | INTRAVENOUS | Status: DC
  Administered 2023-02-17: 100 mg via INTRAVENOUS
  Filled 2023-02-16: qty 5

## 2023-02-16 MED ORDER — DEXTROSE-SODIUM CHLORIDE 5-0.45 % IV SOLN: Freq: Once | INTRAVENOUS | Status: AC

## 2023-02-16 MED ORDER — HEPARIN (PORCINE) 25000 UT/250ML-% IV SOLN
2000.0000 [IU]/h | INTRAVENOUS | Status: DC
  Administered 2023-02-16: 1200 [IU]/h via INTRAVENOUS
  Administered 2023-02-17: 1600 [IU]/h via INTRAVENOUS
  Administered 2023-02-18 – 2023-02-20 (×3): 1900 [IU]/h via INTRAVENOUS
  Administered 2023-02-20: 2150 [IU]/h via INTRAVENOUS
  Administered 2023-02-21 – 2023-02-23 (×5): 2050 [IU]/h via INTRAVENOUS
  Filled 2023-02-16 (×12): qty 250

## 2023-02-16 NOTE — Progress Notes (Signed)
PHARMACY - PHYSICIAN COMMUNICATION CRITICAL VALUE ALERT - BLOOD CULTURE IDENTIFICATION (BCID)  [name] is an 52 y.o. female who presented to [facility] on 01/27/2023 with a chief complaint of GI bleeding and unresponsiveness.  Assessment:  Pt has had complex medical course with need for colectomy and abscess drainage and multiple courses of ABX, now currently on broad-spectrum ABX for worsening leukocytosis; BCID growing Candida glabrata in 1 of 4 bottles.  Name of physician (or Provider) ContactedNaoma Diener MD  Current antibiotics: vancomycin, cefepime, metronidazole  Changes to prescribed antibiotics recommended:  Recommendations accepted by provider -- will add micafungin 100mg  IV Q24H; could consider narrowing current ABX if the candidemia fits the whole clinical picture; ID will be automatically alerted re: Candida glabrata.  Results for orders placed or performed during the hospital encounter of 01/27/23  Blood Culture ID Panel (Reflexed) (Collected: 02/12/2023 12:59 AM)  Result Value Ref Range   Enterococcus faecalis NOT DETECTED NOT DETECTED   Enterococcus Faecium NOT DETECTED NOT DETECTED   Listeria monocytogenes NOT DETECTED NOT DETECTED   Staphylococcus species NOT DETECTED NOT DETECTED   Staphylococcus aureus (BCID) NOT DETECTED NOT DETECTED   Staphylococcus epidermidis NOT DETECTED NOT DETECTED   Staphylococcus lugdunensis NOT DETECTED NOT DETECTED   Streptococcus species NOT DETECTED NOT DETECTED   Streptococcus agalactiae NOT DETECTED NOT DETECTED   Streptococcus pneumoniae NOT DETECTED NOT DETECTED   Streptococcus pyogenes NOT DETECTED NOT DETECTED   A.calcoaceticus-baumannii NOT DETECTED NOT DETECTED   Bacteroides fragilis NOT DETECTED NOT DETECTED   Enterobacterales NOT DETECTED NOT DETECTED   Enterobacter cloacae complex NOT DETECTED NOT DETECTED   Escherichia coli NOT DETECTED NOT DETECTED   Klebsiella aerogenes NOT DETECTED NOT DETECTED   Klebsiella  oxytoca NOT DETECTED NOT DETECTED   Klebsiella pneumoniae NOT DETECTED NOT DETECTED   Proteus species NOT DETECTED NOT DETECTED   Salmonella species NOT DETECTED NOT DETECTED   Serratia marcescens NOT DETECTED NOT DETECTED   Haemophilus influenzae NOT DETECTED NOT DETECTED   Neisseria meningitidis NOT DETECTED NOT DETECTED   Pseudomonas aeruginosa NOT DETECTED NOT DETECTED   Stenotrophomonas maltophilia NOT DETECTED NOT DETECTED   Candida albicans NOT DETECTED NOT DETECTED   Candida auris NOT DETECTED NOT DETECTED   Candida glabrata DETECTED (A) NOT DETECTED   Candida krusei NOT DETECTED NOT DETECTED   Candida parapsilosis NOT DETECTED NOT DETECTED   Candida tropicalis NOT DETECTED NOT DETECTED   Cryptococcus neoformans/gattii NOT DETECTED NOT DETECTED    Vernard Gambles, PharmD, BCPS  02/16/2023  3:54 AM

## 2023-02-16 NOTE — Progress Notes (Signed)
Brief Note:   Pt continues with significant output via ileostomy (almost 4 L in 24 hours). Remains on CL with trickle of Vivonex at 20 ml/hr. Currently on hold for repeat CT abdomen.  Recommend initiation of TPN due to high ileostomy output on minimal enteral nutrition; concerned that pt is maldigesting/albsorbing; discussed with Surgery who is agreeable but holding off today as no central access and holding on placement due to bacteremia.   Romelle Starcher MS, RDN, LDN, CNSC Registered Dietitian 3 Clinical Nutrition RD Pager and On-Call Pager Number Located in [place]

## 2023-02-16 NOTE — Progress Notes (Signed)
Progress Note  5 Days Post-Op  Subjective: Pain stable, continued high ileostomy output. WBC increasing still. Nausea slightly better.   Objective: Vital signs in last 24 hours: Temp:  [98 F (36.7 C)-98.3 F (36.8 C)] 98 F (36.7 C) (10/16 0814) Pulse Rate:  [127-139] 127 (10/16 0841) Resp:  [17-29] 20 (10/16 0841) BP: (109-137)/(34-66) 135/66 (10/16 0814) SpO2:  [92 %-98 %] 96 % (10/16 0841) Weight:  [68.8 kg] 68.8 kg (10/16 0107) Last BM Date : 02/15/23  Intake/Output from previous day: 10/15 0701 - 10/16 0700 In: 2675.8 [P.O.:600; NG/GT:500; IV Piggyback:1575.8] Out: 4970 [Urine:1500; Drains:95; Stool:3375] Intake/Output this shift: Total I/O In: -  Out: 700 [Stool:700]  PE: General: pleasant, WD, WN female who is laying in bed in NAD Heart: sinus tachycardia in the 120-130s Lungs: Respiratory effort nonlabored Abd: soft, appropriately ttp, VAC to midline, ostomy pink with large volume liquid, drain cloudy   Lab Results:  Recent Labs    02/15/23 0246 02/16/23 0413  WBC 38.9* 40.8*  HGB 10.1* 9.4*  HCT 30.4* 28.6*  PLT 618* 609*   BMET Recent Labs    02/15/23 1206 02/16/23 0413  NA 133* 138  K 4.0 3.8  CL 103 101  CO2 22 25  GLUCOSE 153* 109*  BUN <5* <5*  CREATININE 0.55 0.48  CALCIUM 8.1* 8.9   PT/INR No results for input(s): "LABPROT", "INR" in the last 72 hours. CMP     Component Value Date/Time   NA 138 02/16/2023 0413   K 3.8 02/16/2023 0413   CL 101 02/16/2023 0413   CO2 25 02/16/2023 0413   GLUCOSE 109 (H) 02/16/2023 0413   BUN <5 (L) 02/16/2023 0413   CREATININE 0.48 02/16/2023 0413   CREATININE 0.68 06/09/2021 1305   CALCIUM 8.9 02/16/2023 0413   PROT 5.3 (L) 02/16/2023 0413   ALBUMIN 2.9 (L) 02/16/2023 0413   AST 14 (L) 02/16/2023 0413   ALT 8 02/16/2023 0413   ALKPHOS 135 (H) 02/16/2023 0413   BILITOT 0.5 02/16/2023 0413   GFRNONAA >60 02/16/2023 0413   GFRAA >60 07/30/2019 1301   Lipase     Component Value  Date/Time   LIPASE 27 07/09/2022 1313       Studies/Results: No results found.  Anti-infectives: Anti-infectives (From admission, onward)    Start     Dose/Rate Route Frequency Ordered Stop   02/17/23 0400  micafungin (MYCAMINE) 100 mg in sodium chloride 0.9 % 100 mL IVPB        100 mg 105 mL/hr over 1 Hours Intravenous Every 24 hours 02/16/23 0353     02/16/23 0415  micafungin (MYCAMINE) 100 mg in sodium chloride 0.9 % 100 mL IVPB        100 mg 105 mL/hr over 1 Hours Intravenous STAT 02/16/23 0353 02/16/23 0610   02/15/23 1300  vancomycin (VANCOREADY) IVPB 750 mg/150 mL        750 mg 150 mL/hr over 60 Minutes Intravenous Every 12 hours 02/14/23 1156     02/14/23 1130  vancomycin (VANCOCIN) IVPB 1000 mg/200 mL premix        1,000 mg 200 mL/hr over 60 Minutes Intravenous  Once 02/14/23 1043 02/14/23 1249   02/11/23 2000  cefTRIAXone (ROCEPHIN) 2 g in sodium chloride 0.9 % 100 mL IVPB       Note to Pharmacy: Pharmacy may adjust dosing strength / duration / interval for maximal efficacy   2 g 200 mL/hr over 30 Minutes Intravenous Every 24 hours 02/11/23  1622     02/10/23 1600  metroNIDAZOLE (FLAGYL) IVPB 500 mg        500 mg 100 mL/hr over 60 Minutes Intravenous Every 12 hours 02/10/23 1444     02/08/23 1800  vancomycin (VANCOCIN) 50 mg/mL oral solution SOLN 500 mg        500 mg Per Tube Every 6 hours 02/08/23 1325 02/12/23 1759   02/03/23 2200  metroNIDAZOLE (FLAGYL) tablet 500 mg        500 mg Per Tube Every 12 hours 02/03/23 1406 02/06/23 2148   02/02/23 1815  vancomycin (VANCOCIN) 50 mg/mL oral solution SOLN 125 mg  Status:  Discontinued        125 mg Per Tube Every 6 hours 02/02/23 1717 02/08/23 1325   02/02/23 1545  vancomycin (VANCOCIN) capsule 125 mg  Status:  Discontinued        125 mg Oral 4 times daily 02/02/23 1530 02/02/23 1717   01/27/23 1430  cefTRIAXone (ROCEPHIN) 2 g in sodium chloride 0.9 % 100 mL IVPB        2 g 200 mL/hr over 30 Minutes Intravenous Every 24  hours 01/27/23 1340 02/05/23 1403   01/27/23 1400  metroNIDAZOLE (FLAGYL) IVPB 500 mg  Status:  Discontinued        500 mg 100 mL/hr over 60 Minutes Intravenous Every 12 hours 01/27/23 1340 02/03/23 1406   01/27/23 0545  vancomycin (VANCOREADY) IVPB 1250 mg/250 mL        1,250 mg 166.7 mL/hr over 90 Minutes Intravenous  Once 01/27/23 0542 01/27/23 0809   01/27/23 0545  ceFEPIme (MAXIPIME) 2 g in sodium chloride 0.9 % 100 mL IVPB        2 g 200 mL/hr over 30 Minutes Intravenous  Once 01/27/23 0542 01/27/23 0621   01/27/23 0545  metroNIDAZOLE (FLAGYL) IVPB 500 mg        500 mg 100 mL/hr over 60 Minutes Intravenous  Once 01/27/23 0542 01/27/23 0741        Assessment/Plan Refractory Crohn's disease 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 Luisa Hart  - Leukocytosis uptrending to 40 today  - CT AP with PO/IV contrast today - NPO and hold TF for CT - if no intervenable findings then trial soft diet and resume TF to supplement - Continue drain - continue medical treatment for C.diff - mobilize - WOC following for new ileostomy and VAC - ok to change VAC M/R  FEN: NPO, IVF per primary  VTE: SQH ID: rocephin/flagyl  - per CCM -  COPD HTN Chronic pain  Collagen vascular disease T2DM Seizure disorder  LOS: 20 days     Juliet Rude, [facility] 02/16/2023, 9:04 AM Please see Amion for pager number during day hours 7:00am-4:30pm

## 2023-02-16 NOTE — Progress Notes (Signed)
eLink Physician-Brief Progress Note Patient Name: [name] DOB: 1970-05-28 MRN: [id number]   Date of Service  02/16/2023  HPI/Events of Note  Notified of positive blood culture 1/4 Candida glabrata  Colonic perforation s/p colostomy  eICU Interventions  Start Micafungin Continue vanc/cefepime/Flagyl for now awaiting final cultures. Bedside rounding team to reassess regarding de-escalation of antibiotics Conferred with pharmacy     Intervention Category Major Interventions: Infection - evaluation and management  Darl Pikes 02/16/2023, 3:53 AM

## 2023-02-16 NOTE — Progress Notes (Signed)
eLink Physician-Brief Progress Note Patient Name: [name] DOB: [birth year] MRN: [id number]   Date of Service  02/16/2023  HPI/Events of Note  Tachycardia 130-140 has been tachy.  has high ostomy output though, 3-5L a day. No maitainence fluids. Trying to push po intake.   She does have PRN metoprolol. BP not meeting parameters to give. order says hold < 120/80, her BP 113/53  eICU Interventions  Ordered a trial of 500 cc LR bolus This is not a camera capable room     Intervention Category Intermediate Interventions: Arrhythmia - evaluation and management  Darl Pikes 02/16/2023, 12:47 AM

## 2023-02-16 NOTE — Progress Notes (Signed)
Physical Therapy Treatment Patient Details Name: [name] MRN: [id number] DOB: 1970-06-16 Today's Date: 02/16/2023   History of Present Illness 52 y.o. female presents to [facility] hospital on 01/27/2023 after being found unresponsive at home down in a pool of bloody vomit/stool. CPR was initiated upon arrival to ED, pt intubated and started on pressors. Pt undergoing management for inflammatory vs infections colitis. Pt extubated on 10/2. 10/10 underwent repeat proctosigmoidoscopy for ongoing diarrhea.  Diffuse severe inflammation identified due to combination of Crohn's disease, active C. difficile infection and possible ischemic colitis from prior hypotension on presentation. 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 10/13 became tachycardic and hypotensive.  More lethargic.  Transferred back to ICU. PMH includes COPD, Crohn's disease, HTN, DMII.    PT Comments  Pt received in supine and agreeable to session. Pt requires mod A for bed mobility due to weakness and pain. Pt reports dizziness in sitting that improves with increased time. Pt able to stand with min A and states that she cannot march in place, however is able to side step towards [facility] with CGA for safety. Pt able to tolerate extended time sitting EOB before returning to supine. Pt continues to benefit from PT services to progress toward functional mobility goals.     If plan is discharge home, recommend the following: A lot of help with bathing/dressing/bathroom;Two people to help with walking and/or transfers;Assistance with cooking/housework;Direct supervision/assist for medications management;Assist for transportation;Supervision due to cognitive status;Direct supervision/assist for financial management;Help with stairs or ramp for entrance   Can travel by private vehicle     No  Equipment Recommendations  Rolling walker (2 wheels);BSC/3in1;Wheelchair (measurements PT);Wheelchair cushion (measurements PT)     Recommendations for Other Services       Precautions / Restrictions Precautions Precautions: Fall;Other (comment) Precaution Comments: watch HR/RR, bulb drain L, new colostomy, abdominal wound vac Restrictions Weight Bearing Restrictions: No     Mobility  Bed Mobility Overal bed mobility: Needs Assistance Bed Mobility: Rolling, Sidelying to Sit, Sit to Supine Rolling: Mod assist, Used rails Sidelying to sit: Mod assist, Used rails, HOB elevated   Sit to supine: Mod assist   General bed mobility comments: mod A to roll, for trunk elevation, and BLE management    Transfers Overall transfer level: Needs assistance Equipment used: Rolling walker (2 wheels) Transfers: Sit to/from Stand Sit to Stand: Min assist           General transfer comment: From EOB with min A for power up. increased time to reach a full upright posture    Ambulation/Gait Ambulation/Gait assistance: Contact guard assist Gait Distance (Feet): 2 Feet Assistive device: Rolling walker (2 wheels) Gait Pattern/deviations: Step-to pattern, Trunk flexed       General Gait Details: Pt taking side steps towards HOB with CGA for safety      Balance Overall balance assessment: Needs assistance Sitting-balance support: Feet supported, Bilateral upper extremity supported Sitting balance-Leahy Scale: Poor Sitting balance - Comments: dependent on UEs to stabilize self   Standing balance support: Bilateral upper extremity supported, Reliant on assistive device for balance, During functional activity Standing balance-Leahy Scale: Poor Standing balance comment: with RW support                            Cognition Arousal: Alert Behavior During Therapy: WFL for tasks assessed/performed Overall Cognitive Status: Impaired/Different from baseline  Exercises General Exercises - Lower Extremity Long Arc Quad: AROM, Seated, Both, 5  reps    General Comments General comments (skin integrity, edema, etc.): HR up to 140 with mobility      Pertinent Vitals/Pain Pain Assessment Pain Assessment: Faces Faces Pain Scale: Hurts even more Pain Descriptors / Indicators: Discomfort, Grimacing, Tightness, Moaning, Operative site guarding Pain Intervention(s): Limited activity within patient's tolerance, Monitored during session     PT Goals (current goals can now be found in the care plan section) Acute Rehab PT Goals Patient Stated Goal: go home PT Goal Formulation: With patient Time For Goal Achievement: 02/17/23 Progress towards PT goals: Progressing toward goals    Frequency    Min 1X/week       AM-PAC PT "6 Clicks" Mobility   Outcome Measure  Help needed turning from your back to your side while in a flat bed without using bedrails?: A Lot Help needed moving from lying on your back to sitting on the side of a flat bed without using bedrails?: A Lot Help needed moving to and from a bed to a chair (including a wheelchair)?: A Lot Help needed standing up from a chair using your arms (e.g., wheelchair or bedside chair)?: A Little Help needed to walk in hospital room?: A Lot Help needed climbing 3-5 steps with a railing? : Total 6 Click Score: 12    End of Session   Activity Tolerance: Patient limited by fatigue;Patient limited by pain Patient left: in bed;with call bell/phone within reach;with nursing/sitter in room Nurse Communication: Mobility status PT Visit Diagnosis: Other abnormalities of gait and mobility (R26.89);Muscle weakness (generalized) (M62.81)     Time: 1610-9604 PT Time Calculation (min) (ACUTE ONLY): 35 min  Charges:    $Therapeutic Activity: 23-37 mins PT General Charges $$ ACUTE PT VISIT: 1 Visit                    [name], PTA Acute Rehabilitation Services Secure Chat Preferred  [phone]    [name] 02/16/2023, 1:51 PM

## 2023-02-16 NOTE — Progress Notes (Signed)
Radiology notified me for CT AP that showed splenic infarcts and pulm emboli. I spoke with gen surg on call Dr. Cliffton Asters who advised to do heparin drip without boluses. Hgb was 10.1 and now 9.4. Will continue to monitor.

## 2023-02-16 NOTE — Progress Notes (Signed)
PROGRESS NOTE    [name]  QVZ:[id number] DOB: Sep 17, 1970 DOA: 01/27/2023 PCP: [name], PA-C   Brief Narrative:  This is a 52 year old female with past medical history of COPD, Crohn's on Skyrizi, hypertension, type 2 diabetes mellitus who initially presented to the after being found unresponsive. Per family, patient has had bloody emesis with diarrhea over the past few days. Initial systolics in the 60s in the ED, and patient was minimally responsive. In the emergency room, patient was intubated, and patient was admitted to the ICU for further evaluation and management for shock and altered mental status.    01/27/2023: Admitted to ICU and intubated.  Felt to have septic shock of unclear source which resolved antibiotic therapy and fluid resuscitation.  Suspicion was that she may have had C. difficile colitis despite negative toxin.  She was treated with vancomycin. 9/29 Off pressors 10/2: Extubated 10/3 transfer to floor. 10/10 underwent repeat proctosigmoidoscopy for ongoing diarrhea.  Diffuse severe inflammation identified due to combination of Crohn's disease, active C. difficile infection and possible ischemic colitis from prior hypotension on presentation. 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  Intraoperative findings include frank necrosis with pelvic abscess formation and significant colitis. 10/12 some dilated small bowel with minimal OG output.  Continued observation advised.  Having significant postoperative pain. 10/13 became tachycardic and hypotensive.  More lethargic.  Transferred back to ICU. 10/15 blood cultures reports fungemia, resume vancomycin  Assessment & Plan:   Principal Problem:   Cardiac arrest [facility]) Active Problems:   Metabolic encephalopathy   Shock (HCC)   Acute respiratory failure with hypoxia (HCC)   Upper GI bleed   Granulomatous colitis (HCC)   Pseudomembranous colitis   Crohn's disease without complication (HCC)    Ileus (HCC)   C. difficile diarrhea   Sepsis (HCC)   Pressure injury of skin   Hyponatremia   Severe protein-calorie malnutrition (HCC)   ABLA (acute blood loss anemia)   Septic shock, multifactorial, POA -Cultures not reporting fungemia, fulminant C. difficile colitis and intra-abdominal abscess also notable sources for infection -Continue micafungin, vancomycin, ceftriaxone, Flagyl -ID following, appreciate insight recommendations -Status post colectomy   Infectious colitis due to C. difficile on treatment -See above -High output ostomy, continue to supplement p.o. and IV hydration as appropriate  Longstanding Crohn's disease; not chronically on steroids so less concern for adrenal insufficiency -Hold steroids, Skyrizi   Type 2 diabetes -SSI PRN, has not needed much insulin currently -goal BG 140-180   History of seizures -con't PTA keppra -resume PTA gabapentin   History of anxiety -con't ativan 2mg  PRN -likely can soon resume PTA anxiety meds- Cymbalta, atarax   History of hypertension -holding PTA antihypertensives due to concern for uncontrolled sepsis   History of COPD -con't xopenex PRN -switch PTA spiriva to yupelri for now   GERD -con't PTA PPI; may make C diff treatment less successful   Anemia  -transfuse for Hb <7 or hemodynamically significant bleeding -monitor   Hypokalemia -repleted, monitor  At high risk for malnutrition. -Currently n.p.o. for repeat imaging per surgery, advance diet per their expertise -adding thiamine, folic acid given chronic risk for malabsorption with crohn's and C diff.  -hopefully can avoid TPN  Cardiac arrest secondary to septic shock requiring CPR on admission Echocardiogram on admit within normal limits  Crohn's disease - well controlled on Skyrizi Unclear if this plays a role in her overall condition Crohn's colitis/ischemic colitis/C. difficile all playing a role and not a candidate at this  time for [facility] or  any other immunosuppressants   Pericardial effusion without tamponade Will need 6 to 8-week follow-up with Dr. Diona Browner in the outpatient setting for repeat echo   Seizure disorder Continue Keppra  Anxiety disorder-continue Ativan as needed   Non-insulin-dependent diabetes type 2, well-controlled  -A1c 5.8 -advance diet as tolerated, continue hypoglycemic protocol and sliding scale insulin   DVT prophylaxis: heparin injection 5,000 Units Start: 02/12/23 1000 SCDs Start: 01/27/23 1111   Code Status:   Code Status: Full Code Family Communication: None present  Status is: Inpatient  Dispo: The patient is from: Home              Anticipated d/c is to: Home              Anticipated d/c date is: To be determined              Patient currently not medically stable for discharge  Consultants:  PCCM, ID, general surgery  Procedures:  Colectomy with ostomy placement  Antimicrobials:  Ceftriaxone, Flagyl, micafungin, vancomycin  Subjective: No acute issues or events overnight denies nausea vomiting diarrhea constipation high fevers chills or chest pain.  Objective: Vitals:   02/15/23 2250 02/16/23 0000 02/16/23 0107 02/16/23 0308  BP: (!) 113/53   (!) 137/51  Pulse: (!) 137 (!) 139  (!) 127  Resp: 20 19  17   Temp: 98.3 F (36.8 C)   98 F (36.7 C)  TempSrc: Oral   Oral  SpO2: 94% 92%  98%  Weight:   68.8 kg   Height:        Intake/Output Summary (Last 24 hours) at 02/16/2023 0745 Last data filed at 02/16/2023 0700 Gross per 24 hour  Intake 2675.79 ml  Output 4970 ml  Net -2294.21 ml   Filed Weights   02/14/23 0400 02/15/23 0616 02/16/23 0107  Weight: 65.4 kg 64 kg 68.8 kg    Examination:  General exam: Appears calm and comfortable  Respiratory system: Clear to auscultation. Respiratory effort normal. Cardiovascular system: S1 & S2 heard, RRR. No JVD, murmurs, rubs, gallops or clicks. No pedal edema. Gastrointestinal system: Abdomen is nondistended, soft and  nontender. No organomegaly or masses felt. Normal bowel sounds heard. Central nervous system: Alert and oriented. No focal neurological deficits. Extremities: Symmetric 5 x 5 power. Skin: No rashes, lesions or ulcers Psychiatry: Judgement and insight appear normal. Mood & affect appropriate.     Data Reviewed: I have personally reviewed following labs and imaging studies  CBC: Recent Labs  Lab 02/12/23 0448 02/13/23 0337 02/14/23 0244 02/15/23 0246 02/16/23 0413  WBC 26.7* 36.1* 35.0* 38.9* 40.8*  HGB 12.0 10.6* 10.1* 10.1* 9.4*  HCT 36.2 31.7* 31.5* 30.4* 28.6*  MCV 82.1 85.4 85.6 84.0 86.1  PLT 741* 670* 611* 618* 609*   Basic Metabolic Panel: Recent Labs  Lab 02/10/23 0607 02/11/23 0427 02/11/23 1555 02/13/23 0337 02/14/23 0244 02/15/23 0246 02/15/23 1206 02/16/23 0413  NA 135 134*   < > 135 138 135 133* 138  K 4.6 5.6*   < > 3.9 3.1* 3.3* 4.0 3.8  CL 96* 100   < > 102 108 101 103 101  CO2 26 22   < > 20* 18* 23 22 25   GLUCOSE 134* 100*   < > 80 90 115* 153* 109*  BUN 8 8   < > <5* <5* <5* <5* <5*  CREATININE 0.51 0.71   < > 0.62 0.35* 0.48 0.55 0.48  CALCIUM 9.3 8.8*   < >  8.2* 8.0* 8.5* 8.1* 8.9  MG 2.2 2.0  --   --   --  1.6*  --  2.0  PHOS 4.7* 4.6  --   --   --   --   --   --    < > = values in this interval not displayed.   GFR: Estimated Creatinine Clearance: 75.7 mL/min (by C-G formula based on SCr of 0.48 mg/dL). Liver Function Tests: Recent Labs  Lab 02/12/23 0448 02/13/23 0337 02/14/23 0244 02/15/23 0246 02/16/23 0413  AST 17 18 14* 13* 14*  ALT 12 13 10 10 8   ALKPHOS 55 45 60 83 135*  BILITOT 0.7 0.6 0.3 0.4 0.5  PROT 4.8* 4.9* 4.9* 5.1* 5.3*  ALBUMIN 2.2* 2.4* 2.6* 2.4* 2.9*   CBG: Recent Labs  Lab 02/15/23 1133 02/15/23 1615 02/15/23 2023 02/15/23 2303 02/16/23 0334  GLUCAP 141* 133* 115* 122* 96   Sepsis Labs: Recent Labs  Lab 02/10/23 0607 02/12/23 0057 02/13/23 0913  PROCALCITON 0.31  --   --   LATICACIDVEN  --  1.9  1.2    Recent Results (from the past 240 hour(s))  Culture, blood (Routine X 2) w Reflex to ID Panel     Status: None (Preliminary result)   Collection Time: 02/12/23 12:57 AM   Specimen: BLOOD LEFT HAND  Result Value Ref Range Status   Specimen Description BLOOD LEFT HAND  Final   Special Requests   Final    BOTTLES DRAWN AEROBIC AND ANAEROBIC Blood Culture adequate volume   Culture   Final    NO GROWTH 3 DAYS Performed at [facility], [address] [place], [place] [address]    Report Status PENDING  Incomplete  Culture, blood (Routine X 2) w Reflex to ID Panel     Status: None (Preliminary result)   Collection Time: 02/12/23 12:59 AM   Specimen: BLOOD LEFT ARM  Result Value Ref Range Status   Specimen Description BLOOD LEFT ARM  Final   Special Requests   Final    BOTTLES DRAWN AEROBIC AND ANAEROBIC Blood Culture adequate volume   Culture  Setup Time   Final    YEAST AEROBIC BOTTLE ONLY Organism ID to follow CRITICAL RESULT CALLED TO, READ BACK BY AND VERIFIED WITH: V [email or web address],[email or web address]  02/16/23 MK    Culture   Final    YEAST Performed at [facility], [address], [place], [place] [address]    Report Status PENDING  Incomplete  Blood Culture ID Panel (Reflexed)     Status: Abnormal   Collection Time: 02/12/23 12:59 AM  Result Value Ref Range Status   Enterococcus faecalis NOT DETECTED NOT DETECTED Final   Enterococcus Faecium NOT DETECTED NOT DETECTED Final   Listeria monocytogenes NOT DETECTED NOT DETECTED Final   Staphylococcus species NOT DETECTED NOT DETECTED Final   Staphylococcus aureus (BCID) NOT DETECTED NOT DETECTED Final   Staphylococcus epidermidis NOT DETECTED NOT DETECTED Final   Staphylococcus lugdunensis NOT DETECTED NOT DETECTED Final   Streptococcus species NOT DETECTED NOT DETECTED Final   Streptococcus agalactiae NOT DETECTED NOT DETECTED Final   Streptococcus pneumoniae NOT DETECTED NOT DETECTED Final   Streptococcus pyogenes NOT  DETECTED NOT DETECTED Final   A.calcoaceticus-baumannii NOT DETECTED NOT DETECTED Final   Bacteroides fragilis NOT DETECTED NOT DETECTED Final   Enterobacterales NOT DETECTED NOT DETECTED Final   Enterobacter cloacae complex NOT DETECTED NOT DETECTED Final   Escherichia coli NOT DETECTED NOT DETECTED Final  Klebsiella aerogenes NOT DETECTED NOT DETECTED Final   Klebsiella oxytoca NOT DETECTED NOT DETECTED Final   Klebsiella pneumoniae NOT DETECTED NOT DETECTED Final   Proteus species NOT DETECTED NOT DETECTED Final   Salmonella species NOT DETECTED NOT DETECTED Final   Serratia marcescens NOT DETECTED NOT DETECTED Final   Haemophilus influenzae NOT DETECTED NOT DETECTED Final   Neisseria meningitidis NOT DETECTED NOT DETECTED Final   Pseudomonas aeruginosa NOT DETECTED NOT DETECTED Final   Stenotrophomonas maltophilia NOT DETECTED NOT DETECTED Final   Candida albicans NOT DETECTED NOT DETECTED Final   Candida auris NOT DETECTED NOT DETECTED Final   Candida glabrata DETECTED (A) NOT DETECTED Final    Comment: CRITICAL RESULT CALLED TO, READ BACK BY AND VERIFIED WITH: V BRYK,[email or web address]  02/16/23 MK    Candida krusei NOT DETECTED NOT DETECTED Final   Candida parapsilosis NOT DETECTED NOT DETECTED Final   Candida tropicalis NOT DETECTED NOT DETECTED Final   Cryptococcus neoformans/gattii NOT DETECTED NOT DETECTED Final    Comment: Performed at [facility], [address], [place], [place] [address]  MRSA Next Gen by PCR, Nasal     Status: None   Collection Time: 02/13/23  8:12 AM   Specimen: Nasal Mucosa; Nasal Swab  Result Value Ref Range Status   MRSA by PCR Next Gen NOT DETECTED NOT DETECTED Final    Comment: (NOTE) The GeneXpert MRSA Assay (FDA approved for NASAL specimens only), is one component of a comprehensive MRSA colonization surveillance program. It is not intended to diagnose MRSA infection nor to guide or monitor treatment for MRSA infections. Test performance  is not FDA approved in patients less than 54 years old. Performed at [facility], [address] [place], [place] [address]          Radiology Studies: No results found.      Scheduled Meds:  acetaminophen (TYLENOL) oral liquid 160 mg/5 mL  1,000 mg Per Tube Q6H   amitriptyline  25 mg Oral QHS   Chlorhexidine Gluconate Cloth  6 each Topical Daily   DULoxetine  30 mg Oral Daily   feeding supplement (PROSource TF20)  60 mL Per Tube QID   fentaNYL  1 patch Transdermal Q72H   ferrous sulfate  325 mg Oral BID WC   fiber supplement (BANATROL TF)  60 mL Per Tube BID   folic acid  1 mg Intravenous Daily   free water  50 mL Per Tube Q4H   gabapentin  100 mg Oral Q8H   Gerhardt's butt cream   Topical BID   heparin injection (subcutaneous)  5,000 Units Subcutaneous Q8H   insulin aspart  0-9 Units Subcutaneous Q4H   lidocaine  2 patch Transdermal Q24H   loperamide  2 mg Oral BID   methocarbamol  1,000 mg Per Tube TID   multivitamin with minerals  1 tablet Oral Daily   mouth rinse  15 mL Mouth Rinse 4 times per day   pantoprazole (PROTONIX) IV  40 mg Intravenous Q24H   revefenacin  175 mcg Nebulization Daily   thiamine (VITAMIN B1) injection  100 mg Intravenous Daily   Vivonex RTF  1,000 mL Oral Q24H   Continuous Infusions:  cefTRIAXone (ROCEPHIN)  IV Stopped (02/15/23 2121)   levETIRAcetam 430 mL/hr at 02/16/23 0700   metronidazole Stopped (02/16/23 0619)   [START ON 02/17/2023] micafungin (MYCAMINE) 100 mg in sodium chloride 0.9 % 100 mL IVPB     vancomycin Stopped (02/16/23 0252)  LOS: 20 days   Time spent:  Azucena Fallen, DO [facility]  If 7PM-7AM, please contact night-coverage [email or web address]  02/16/2023, 7:45 AM

## 2023-02-16 NOTE — Progress Notes (Signed)
PHARMACY - ANTICOAGULATION CONSULT NOTE  Pharmacy Consult for heparin Indication: pulmonary embolus (no boluses per surgery due to recent colectomy)  Allergies  Allergen Reactions   Codeine Shortness Of Breath, Swelling and Rash    Throat swelling   Dilaudid [Hydromorphone Hcl] Shortness Of Breath and Swelling   Hydrocodone Shortness Of Breath, Swelling and Rash   Ketorolac Nausea And Vomiting and Swelling    Pt reports n/v and swelling to throat   Morphine And Codeine Shortness Of Breath, Swelling and Rash   Oxycodone Shortness Of Breath, Swelling and Rash    Patient states ALL pain medications make her deathly sick.   Penicillins Shortness Of Breath, Swelling and Other (See Comments)    Throat swells 02/06/21--TOLERATES CEFTRIAXONE     Nickel Rash   Pregabalin Other (See Comments)   Acetaminophen     Per MD patient states she can't take Tylenol because of liver enzymes   Ativan [Lorazepam] Other (See Comments)    Migraines.   Strawberry Extract Swelling   Watermelon Concentrate Swelling   Albuterol Palpitations   Benadryl [Diphenhydramine Hcl] Palpitations   Humira [Adalimumab] Rash   Latex Rash   Remicade [Infliximab] Rash    Blisters and Welts    Tramadol Rash    Rash and itching    Patient Measurements: Height: 5' 2.99" (160 cm) Weight: 68.8 kg (151 lb 10.8 oz) IBW/kg (Calculated) : 52.38 Heparin Dosing Weight: 68.8  Vital Signs: Temp: 98.5 F (36.9 C) (10/16 1956) Temp Source: Oral (10/16 1956) BP: 135/53 (10/16 1956) Pulse Rate: 128 (10/16 1956)  Labs: Recent Labs    02/14/23 0244 02/15/23 0246 02/15/23 1206 02/16/23 0413  HGB 10.1* 10.1*  --  9.4*  HCT 31.5* 30.4*  --  28.6*  PLT 611* 618*  --  609*  CREATININE 0.35* 0.48 0.55 0.48    Estimated Creatinine Clearance: 75.7 mL/min (by C-G formula based on SCr of 0.48 mg/dL).   Medical History: Past Medical History:  Diagnosis Date   Anxiety    Asthma    Chronic low back pain    Collagen  vascular disease (HCC)    COPD (chronic obstructive pulmonary disease) (HCC)    Crohn's disease (HCC)    GERD (gastroesophageal reflux disease)    EGD 01/2007 by Dr.Rourke small hiatal hernia s/p 56 french maloney    History of head injury    Hypertension    IBS (irritable bowel syndrome)    PSVT (paroxysmal supraventricular tachycardia) (HCC)    PTSD (post-traumatic stress disorder)    Recurrent chest pain    Seizure disorder (HCC)    Type 2 diabetes mellitus (HCC)     Medications:  Scheduled:   acetaminophen (TYLENOL) oral liquid 160 mg/5 mL  1,000 mg Per Tube Q6H   amitriptyline  25 mg Oral QHS   Chlorhexidine Gluconate Cloth  6 each Topical Daily   DULoxetine  30 mg Oral Daily   feeding supplement (PROSource TF20)  60 mL Per Tube QID   fentaNYL  1 patch Transdermal Q72H   ferrous sulfate  325 mg Oral BID WC   fiber supplement (BANATROL TF)  60 mL Per Tube BID   folic acid  1 mg Intravenous Daily   free water  50 mL Per Tube Q4H   gabapentin  100 mg Oral Q8H   Gerhardt's butt cream   Topical BID   heparin injection (subcutaneous)  5,000 Units Subcutaneous Q8H   insulin aspart  0-9 Units Subcutaneous Q4H   lidocaine  2 patch Transdermal Q24H   loperamide  2 mg Oral QID   methocarbamol  1,000 mg Per Tube TID   multivitamin with minerals  1 tablet Oral Daily   mouth rinse  15 mL Mouth Rinse 4 times per day   pantoprazole (PROTONIX) IV  40 mg Intravenous Q24H   revefenacin  175 mcg Nebulization Daily   thiamine (VITAMIN B1) injection  100 mg Intravenous Daily   vancomycin  125 mg Per Tube QID   Vivonex RTF  1,000 mL Oral Q24H   Infusions:   cefTRIAXone (ROCEPHIN)  IV Stopped (02/15/23 2121)   dextrose 5 % and 0.45 % NaCl 50 mL/hr at 02/16/23 1715   levETIRAcetam Stopped (02/16/23 1055)   metronidazole 500 mg (02/16/23 1715)   [START ON 02/17/2023] micafungin (MYCAMINE) 100 mg in sodium chloride 0.9 % 100 mL IVPB     vancomycin 750 mg (02/16/23 1410)   PRN: dextrose,  fentaNYL (SUBLIMAZE) injection, hydrOXYzine, levalbuterol, LORazepam, magic mouthwash, metoprolol tartrate, ondansetron (ZOFRAN) IV  Assessment:  Patient with PMH of COPD, HTN, Crohn's, DM, GERD, asthma, anxiety, head injury, IBS, PTSD, PSVT, and seizure admitted on 9/26 after being found down at home with bloody emesis. She underwent CRP x 5 min with ROSC. She underwent a flexible sigmoidoscopy-she was found to have changes consistent with pseudomembranous colitis. 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 On CT AP 10/16 there was noted splenic infarcts and pulm emboli. Pharmacy consulted on 10/16 to dose heparin without boluses due to recent colectomy.   Goal of Therapy:  Heparin level 0.3-0.7 units/ml Monitor platelets by anticoagulation protocol: Yes   Plan:  Start heparin infusion at 1200 units/hr Heparin level in 6 hours  Daily CBC and heparin levels   Cedric Fishman 02/16/2023,8:51 PM

## 2023-02-16 NOTE — Consult Note (Signed)
Date of Admission:  01/27/2023          Reason for Consult: Candidemia also with fulminant C difficile sp surgery    Referring Provider: CHAMP auto consult and Carma Leaven, MD   Assessment:  Fulminant sodium difficile with necrosis of the bowel requiring total colectomy evacuation of abscess and ileostomy Cardiac arrest Fungemia Leukocytosis Hx of refractory Crohn's disease   Plan:  Agree with micafungin Repeat blood cultures Technically by IDSA guidelines she should have a dilated funduscopic exam during admission to clued fungal up to the mitis though this is not a pressing matter at present nad she so far has grown yeast in 1/2 cultures Add back oral vancomycin to treat for likely C difficile ileitis Agree with CT scan today  Principal Problem:   Cardiac arrest (HCC) Active Problems:   Shock (HCC)   Metabolic encephalopathy   Acute respiratory failure with hypoxia (HCC)   Upper GI bleed   Granulomatous colitis (HCC)   Pseudomembranous colitis   Crohn's disease without complication (HCC)   Ileus (HCC)   C. difficile diarrhea   Sepsis (HCC)   Pressure injury of skin   Hyponatremia   Severe protein-calorie malnutrition (HCC)   ABLA (acute blood loss anemia)   Scheduled Meds:  acetaminophen (TYLENOL) oral liquid 160 mg/5 mL  1,000 mg Per Tube Q6H   amitriptyline  25 mg Oral QHS   Chlorhexidine Gluconate Cloth  6 each Topical Daily   DULoxetine  30 mg Oral Daily   feeding supplement (PROSource TF20)  60 mL Per Tube QID   fentaNYL  1 patch Transdermal Q72H   ferrous sulfate  325 mg Oral BID WC   fiber supplement (BANATROL TF)  60 mL Per Tube BID   folic acid  1 mg Intravenous Daily   free water  50 mL Per Tube Q4H   gabapentin  100 mg Oral Q8H   Gerhardt's butt cream   Topical BID   heparin injection (subcutaneous)  5,000 Units Subcutaneous Q8H   insulin aspart  0-9 Units Subcutaneous Q4H   iohexol  500 mL Oral Q1H   lidocaine  2 patch  Transdermal Q24H   loperamide  2 mg Oral QID   methocarbamol  1,000 mg Per Tube TID   multivitamin with minerals  1 tablet Oral Daily   mouth rinse  15 mL Mouth Rinse 4 times per day   pantoprazole (PROTONIX) IV  40 mg Intravenous Q24H   revefenacin  175 mcg Nebulization Daily   thiamine (VITAMIN B1) injection  100 mg Intravenous Daily   vancomycin  125 mg Per Tube QID   Vivonex RTF  1,000 mL Oral Q24H   Continuous Infusions:  cefTRIAXone (ROCEPHIN)  IV Stopped (02/15/23 2121)   dextrose 5 % and 0.45 % NaCl 50 mL/hr at 02/16/23 1042   levETIRAcetam 750 mg (02/16/23 1039)   metronidazole Stopped (02/16/23 1610)   [START ON 02/17/2023] micafungin (MYCAMINE) 100 mg in sodium chloride 0.9 % 100 mL IVPB     vancomycin Stopped (02/16/23 0252)   PRN Meds:.dextrose, fentaNYL (SUBLIMAZE) injection, hydrOXYzine, levalbuterol, LORazepam, magic mouthwash, metoprolol tartrate, ondansetron (ZOFRAN) IV  HPI: [name] is a 52 y.o. female history of refractory Crohn's disease who was admitted with fulminant C. 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  Same day she  had blood cultures drawn now that are growing Candida in 1 of 2 sites.  She has been on ceftriaxone metronidazole and vancomycin. There is no specific culture data directing these antibiotics.   Micafungin was added today  She is actually NOT currently on treatment for C difficile since IV flagyl is ineffectual.  She clearly does not have C. difficile colitis anymore since her colon is gone she could have an ileitis from C. difficile still and she has quite copious liquid bowel movements in her ileostomy bag.  Will start vancomycin orally (or can go down feeding tube) to treat for C difficile ileitis.   Is a CT abdomen pelvis done today pending.  Hopefully this does not show  new abscesses or infection and is reassuring.  It would be nice tibial to stop her other antibacterial antibiotics since they will continue to put selective pressure for C. difficile.  With regards to her candidemia the IDSA still recommends colluding fungal endophthalmitis with a dedicated dilated funduscopic exam.  Unfortunately the on call from allergist who was called to see one of our other patients yesterday was met by someone who would not cooperate with a dilated funduscopic exam.  Would hold off a few days but my preference is always to have them have a dilated funduscopic exam while in house to exclude fungal endophthalmitis note ophthalmology has different guidelines then we do and they frequently state that is unnecessary to do dilated funduscopic exam.  Again fungal endophthalmitis is quite rare but the IDSA's position is that it should not be missed as it can to permanent vision loss and this study is not terribly complicated  I have personally spent 84 minutes involved in face-to-face and non-face-to-face activities for this patient on the day of the visit. Professional time spent includes the following activities: Preparing to see the patient (review of tests), Obtaining and/or reviewing separately obtained history (admission/discharge record), Performing a medically appropriate examination and/or evaluation , Ordering medications/tests/procedures, referring and communicating with other health care professionals, Documenting clinical information in the EMR, Independently interpreting results (not separately reported), Communicating results to the patient/family/caregiver, Counseling and educating the patient/family/caregiver and Care coordination (not separately reported).          Review of Systems: Review of Systems  Unable to perform ROS: Critical illness    Past Medical History:  Diagnosis Date   Anxiety    Asthma    Chronic low back pain    Collagen vascular disease  (HCC)    COPD (chronic obstructive pulmonary disease) (HCC)    Crohn's disease (HCC)    GERD (gastroesophageal reflux disease)    EGD 01/2007 by Dr.Rourke small hiatal hernia s/p 56 french maloney    History of head injury    Hypertension    IBS (irritable bowel syndrome)    PSVT (paroxysmal supraventricular tachycardia) (HCC)    PTSD (post-traumatic stress disorder)    Recurrent chest pain    Seizure disorder (HCC)    Type 2 diabetes mellitus (HCC)     Social History   Tobacco Use   Smoking status: Some Days    Current packs/day: 0.50    Average packs/day: 0.5 packs/day for 38.4 years (19.2 ttl pk-yrs)    Types: Cigarettes    Start date: 09/26/1984    Passive exposure: Current   Smokeless tobacco: Never  Vaping Use   Vaping status: Never Used  Substance Use Topics   Alcohol use: No    Alcohol/week: 0.0 standard drinks of alcohol  Drug use: Yes    Types: Marijuana    Family History  Problem Relation Age of Onset   Cancer Mother    Heart failure Mother    Hypertension Mother    Heart attack Mother    Heart attack Father 25   Cancer Father 3   Heart failure Father    Diabetes Father    Crohn's disease Cousin    Allergies  Allergen Reactions   Codeine Shortness Of Breath, Swelling and Rash    Throat swelling   Dilaudid [Hydromorphone Hcl] Shortness Of Breath and Swelling   Hydrocodone Shortness Of Breath, Swelling and Rash   Ketorolac Nausea And Vomiting and Swelling    Pt reports n/v and swelling to throat   Morphine And Codeine Shortness Of Breath, Swelling and Rash   Oxycodone Shortness Of Breath, Swelling and Rash    Patient states ALL pain medications make her deathly sick.   Penicillins Shortness Of Breath, Swelling and Other (See Comments)    Throat swells 02/06/21--TOLERATES CEFTRIAXONE     Nickel Rash   Pregabalin Other (See Comments)   Acetaminophen     Per MD patient states she can't take Tylenol because of liver enzymes   Ativan [Lorazepam]  Other (See Comments)    Migraines.   Strawberry Extract Swelling   Watermelon Concentrate Swelling   Albuterol Palpitations   Benadryl [Diphenhydramine Hcl] Palpitations   Humira [Adalimumab] Rash   Latex Rash   Remicade [Infliximab] Rash    Blisters and Welts    Tramadol Rash    Rash and itching    OBJECTIVE: Blood pressure (!) 144/60, pulse (!) 134, temperature 97.8 F (36.6 C), temperature source Oral, resp. rate 18, height 5' 2.99" (1.6 m), weight 68.8 kg, SpO2 91%.  Physical Exam Constitutional:      General: She is not in acute distress.    Appearance: Normal appearance. She is well-developed. She is ill-appearing. She is not diaphoretic.  HENT:     Head: Normocephalic and atraumatic.     Right Ear: Hearing and external ear normal.     Left Ear: Hearing and external ear normal.     Nose: No nasal deformity or rhinorrhea.  Eyes:     General: No scleral icterus.    Conjunctiva/sclera: Conjunctivae normal.     Right eye: Right conjunctiva is not injected.     Left eye: Left conjunctiva is not injected.     Pupils: Pupils are equal, round, and reactive to light.  Neck:     Vascular: No JVD.  Cardiovascular:     Rate and Rhythm: Normal rate and regular rhythm.     Heart sounds: Normal heart sounds, S1 normal and S2 normal. No murmur heard.    No friction rub. No gallop.  Pulmonary:     Effort: Pulmonary effort is normal. No respiratory distress.     Breath sounds: No stridor. No wheezing or rhonchi.  Abdominal:     General: Bowel sounds are normal. There is no distension.     Palpations: Abdomen is soft.  Musculoskeletal:        General: Normal range of motion.     Right shoulder: Normal.     Left shoulder: Normal.     Cervical back: Normal range of motion and neck supple.     Right hip: Normal.     Left hip: Normal.     Right knee: Normal.     Left knee: Normal.  Lymphadenopathy:     Head:  Right side of head: No submandibular, preauricular or posterior  auricular adenopathy.     Left side of head: No submandibular, preauricular or posterior auricular adenopathy.     Cervical: No cervical adenopathy.     Right cervical: No superficial or deep cervical adenopathy.    Left cervical: No superficial or deep cervical adenopathy.  Skin:    General: Skin is warm and dry.     Coloration: Skin is pale.     Findings: No abrasion, bruising, ecchymosis, erythema, lesion or rash.     Nails: There is no clubbing.  Neurological:     Mental Status: She is alert and oriented to person, place, and time.     Sensory: No sensory deficit.     Coordination: Coordination normal.     Gait: Gait normal.  Psychiatric:        Attention and Perception: Attention normal. She is attentive.        Mood and Affect: Mood is anxious.        Speech: Speech normal.        Behavior: Behavior normal. Behavior is cooperative.        Thought Content: Thought content normal.        Judgment: Judgment normal.     Lab Results Lab Results  Component Value Date   WBC 40.8 (H) 02/16/2023   HGB 9.4 (L) 02/16/2023   HCT 28.6 (L) 02/16/2023   MCV 86.1 02/16/2023   PLT 609 (H) 02/16/2023    Lab Results  Component Value Date   CREATININE 0.48 02/16/2023   BUN <5 (L) 02/16/2023   NA 138 02/16/2023   K 3.8 02/16/2023   CL 101 02/16/2023   CO2 25 02/16/2023    Lab Results  Component Value Date   ALT 8 02/16/2023   AST 14 (L) 02/16/2023   ALKPHOS 135 (H) 02/16/2023   BILITOT 0.5 02/16/2023     Microbiology: Recent Results (from the past 240 hour(s))  Culture, blood (Routine X 2) w Reflex to ID Panel     Status: None (Preliminary result)   Collection Time: 02/12/23 12:57 AM   Specimen: BLOOD LEFT HAND  Result Value Ref Range Status   Specimen Description BLOOD LEFT HAND  Final   Special Requests   Final    BOTTLES DRAWN AEROBIC AND ANAEROBIC Blood Culture adequate volume   Culture   Final    NO GROWTH 4 DAYS Performed at [facility], [address], [place], [place] [address]    Report Status PENDING  Incomplete  Culture, blood (Routine X 2) w Reflex to ID Panel     Status: None (Preliminary result)   Collection Time: 02/12/23 12:59 AM   Specimen: BLOOD LEFT ARM  Result Value Ref Range Status   Specimen Description BLOOD LEFT ARM  Final   Special Requests   Final    BOTTLES DRAWN AEROBIC AND ANAEROBIC Blood Culture adequate volume   Culture  Setup Time   Final    YEAST AEROBIC BOTTLE ONLY CRITICAL RESULT CALLED TO, READ BACK BY AND VERIFIED WITH: V BRYK,[id number]  02/16/23 MK    Culture   Final    YEAST Performed at [facility], [address] [place], [place] [address]    Report Status PENDING  Incomplete  Blood Culture ID Panel (Reflexed)     Status: Abnormal   Collection Time: 02/12/23 12:59 AM  Result Value Ref Range Status   Enterococcus faecalis NOT DETECTED NOT DETECTED  Final   Enterococcus Faecium NOT DETECTED NOT DETECTED Final   Listeria monocytogenes NOT DETECTED NOT DETECTED Final   Staphylococcus species NOT DETECTED NOT DETECTED Final   Staphylococcus aureus (BCID) NOT DETECTED NOT DETECTED Final   Staphylococcus epidermidis NOT DETECTED NOT DETECTED Final   Staphylococcus lugdunensis NOT DETECTED NOT DETECTED Final   Streptococcus species NOT DETECTED NOT DETECTED Final   Streptococcus agalactiae NOT DETECTED NOT DETECTED Final   Streptococcus pneumoniae NOT DETECTED NOT DETECTED Final   Streptococcus pyogenes NOT DETECTED NOT DETECTED Final   A.calcoaceticus-baumannii NOT DETECTED NOT DETECTED Final   Bacteroides fragilis NOT DETECTED NOT DETECTED Final   Enterobacterales NOT DETECTED NOT DETECTED Final   Enterobacter cloacae complex NOT DETECTED NOT DETECTED Final   Escherichia coli NOT DETECTED NOT DETECTED Final   Klebsiella aerogenes NOT DETECTED NOT DETECTED Final   Klebsiella oxytoca NOT DETECTED NOT DETECTED Final   Klebsiella pneumoniae NOT DETECTED NOT DETECTED Final   Proteus species  NOT DETECTED NOT DETECTED Final   Salmonella species NOT DETECTED NOT DETECTED Final   Serratia marcescens NOT DETECTED NOT DETECTED Final   Haemophilus influenzae NOT DETECTED NOT DETECTED Final   Neisseria meningitidis NOT DETECTED NOT DETECTED Final   Pseudomonas aeruginosa NOT DETECTED NOT DETECTED Final   Stenotrophomonas maltophilia NOT DETECTED NOT DETECTED Final   Candida albicans NOT DETECTED NOT DETECTED Final   Candida auris NOT DETECTED NOT DETECTED Final   Candida glabrata DETECTED (A) NOT DETECTED Final    Comment: CRITICAL RESULT CALLED TO, READ BACK BY AND VERIFIED WITH: V BRYK,[id number]  02/16/23 MK    Candida krusei NOT DETECTED NOT DETECTED Final   Candida parapsilosis NOT DETECTED NOT DETECTED Final   Candida tropicalis NOT DETECTED NOT DETECTED Final   Cryptococcus neoformans/gattii NOT DETECTED NOT DETECTED Final    Comment: Performed at [facility], [address], [place], [place] [address]  MRSA Next Gen by PCR, Nasal     Status: None   Collection Time: 02/13/23  8:12 AM   Specimen: Nasal Mucosa; Nasal Swab  Result Value Ref Range Status   MRSA by PCR Next Gen NOT DETECTED NOT DETECTED Final    Comment: (NOTE) The GeneXpert MRSA Assay (FDA approved for NASAL specimens only), is one component of a comprehensive MRSA colonization surveillance program. It is not intended to diagnose MRSA infection nor to guide or monitor treatment for MRSA infections. Test performance is not FDA approved in patients less than 51 years old. Performed at [facility], [address] [place], [place] [address]     Acey Lav, MD [facility] [phone] pager  02/16/2023, 11:54 AM

## 2023-02-17 DIAGNOSIS — K50119 Crohn's disease of large intestine with unspecified complications: Secondary | ICD-10-CM | POA: Diagnosis not present

## 2023-02-17 DIAGNOSIS — I469 Cardiac arrest, cause unspecified: Secondary | ICD-10-CM | POA: Diagnosis not present

## 2023-02-17 DIAGNOSIS — D735 Infarction of spleen: Secondary | ICD-10-CM

## 2023-02-17 DIAGNOSIS — I330 Acute and subacute infective endocarditis: Secondary | ICD-10-CM

## 2023-02-17 DIAGNOSIS — A0472 Enterocolitis due to Clostridium difficile, not specified as recurrent: Secondary | ICD-10-CM | POA: Diagnosis not present

## 2023-02-17 DIAGNOSIS — I76 Septic arterial embolism: Secondary | ICD-10-CM

## 2023-02-17 DIAGNOSIS — G9341 Metabolic encephalopathy: Secondary | ICD-10-CM | POA: Diagnosis not present

## 2023-02-17 LAB — CBC
HCT: 29.4 % — ABNORMAL LOW (ref 36.0–46.0)
Hemoglobin: 9.3 g/dL — ABNORMAL LOW (ref 12.0–15.0)
MCH: 27.5 pg (ref 26.0–34.0)
MCHC: 31.6 g/dL (ref 30.0–36.0)
MCV: 87 fL (ref 80.0–100.0)
Platelets: 672 10*3/uL — ABNORMAL HIGH (ref 150–400)
RBC: 3.38 MIL/uL — ABNORMAL LOW (ref 3.87–5.11)
RDW: 16 % — ABNORMAL HIGH (ref 11.5–15.5)
WBC: 44.4 10*3/uL — ABNORMAL HIGH (ref 4.0–10.5)
nRBC: 0.3 % — ABNORMAL HIGH (ref 0.0–0.2)

## 2023-02-17 LAB — COMPREHENSIVE METABOLIC PANEL
ALT: 9 U/L (ref 0–44)
AST: 14 U/L — ABNORMAL LOW (ref 15–41)
Albumin: 2.6 g/dL — ABNORMAL LOW (ref 3.5–5.0)
Alkaline Phosphatase: 132 U/L — ABNORMAL HIGH (ref 38–126)
Anion gap: 15 (ref 5–15)
BUN: 5 mg/dL — ABNORMAL LOW (ref 6–20)
CO2: 24 mmol/L (ref 22–32)
Calcium: 9 mg/dL (ref 8.9–10.3)
Chloride: 97 mmol/L — ABNORMAL LOW (ref 98–111)
Creatinine, Ser: 0.37 mg/dL — ABNORMAL LOW (ref 0.44–1.00)
GFR, Estimated: >60 mL/min (ref >60)
Glucose, Bld: 134 mg/dL — ABNORMAL HIGH (ref 70–99)
Potassium: 3.3 mmol/L — ABNORMAL LOW (ref 3.5–5.1)
Sodium: 136 mmol/L (ref 135–145)
Total Bilirubin: 0.4 mg/dL (ref 0.3–1.2)
Total Protein: 5.3 g/dL — ABNORMAL LOW (ref 6.5–8.1)

## 2023-02-17 LAB — GLUCOSE, CAPILLARY
Glucose-Capillary: 143 mg/dL — ABNORMAL HIGH (ref 70–99)
Glucose-Capillary: 135 mg/dL — ABNORMAL HIGH (ref 70–99)
Glucose-Capillary: 131 mg/dL — ABNORMAL HIGH (ref 70–99)
Glucose-Capillary: 108 mg/dL — ABNORMAL HIGH (ref 70–99)
Glucose-Capillary: 122 mg/dL — ABNORMAL HIGH (ref 70–99)

## 2023-02-17 LAB — MAGNESIUM: Magnesium: 1.6 mg/dL — ABNORMAL LOW (ref 1.7–2.4)

## 2023-02-17 LAB — CULTURE, BLOOD (ROUTINE X 2)
Culture: NO GROWTH
Special Requests: ADEQUATE

## 2023-02-17 LAB — VANCOMYCIN, TROUGH: Vancomycin Tr: 8 ug/mL — ABNORMAL LOW (ref 15–20)

## 2023-02-17 LAB — HEPARIN LEVEL (UNFRACTIONATED)
Heparin Unfractionated: <0.1 [IU]/mL — ABNORMAL LOW (ref 0.30–0.70)
Heparin Unfractionated: <0.1 [IU]/mL — ABNORMAL LOW (ref 0.30–0.70)
Heparin Unfractionated: <0.1 [IU]/mL — ABNORMAL LOW (ref 0.30–0.70)

## 2023-02-17 MED ORDER — POTASSIUM CHLORIDE 20 MEQ PO PACK
40.0000 meq | PACK | Freq: Two times a day (BID) | ORAL | Status: AC
  Administered 2023-02-17 (×2): 40 meq via ORAL
  Filled 2023-02-17 (×2): qty 2

## 2023-02-17 MED ORDER — VANCOMYCIN HCL 750 MG/150ML IV SOLN
750.0000 mg | Freq: Three times a day (TID) | INTRAVENOUS | Status: DC
  Administered 2023-02-17: 750 mg via INTRAVENOUS
  Filled 2023-02-17: qty 150

## 2023-02-17 MED ORDER — MAGNESIUM SULFATE 2 GM/50ML IV SOLN
2.0000 g | Freq: Once | INTRAVENOUS | Status: AC
  Administered 2023-02-17: 2 g via INTRAVENOUS
  Filled 2023-02-17: qty 50

## 2023-02-17 MED ORDER — POTASSIUM CHLORIDE CRYS ER 20 MEQ PO TBCR: 40.0000 meq | EXTENDED_RELEASE_TABLET | Freq: Two times a day (BID) | ORAL | Status: DC

## 2023-02-17 MED ORDER — SODIUM CHLORIDE 0.9 % IV SOLN
150.0000 mg | INTRAVENOUS | Status: DC
  Administered 2023-02-18 – 2023-03-19 (×30): 150 mg via INTRAVENOUS
  Filled 2023-02-17 (×30): qty 7.5

## 2023-02-17 MED ORDER — BANATROL TF EN LIQD
60.0000 mL | Freq: Four times a day (QID) | ENTERAL | Status: DC
  Administered 2023-02-17 – 2023-02-18 (×4): 60 mL
  Filled 2023-02-17 (×7): qty 60

## 2023-02-17 NOTE — Progress Notes (Signed)
PHARMACY - ANTICOAGULATION CONSULT NOTE  Pharmacy Consult for heparin Indication: pulmonary embolus (no boluses per surgery due to recent colectomy)  Allergies  Allergen Reactions   Codeine Shortness Of Breath, Swelling and Rash    Throat swelling   Dilaudid [Hydromorphone Hcl] Shortness Of Breath and Swelling   Hydrocodone Shortness Of Breath, Swelling and Rash   Ketorolac Nausea And Vomiting and Swelling    Pt reports n/v and swelling to throat   Morphine And Codeine Shortness Of Breath, Swelling and Rash   Oxycodone Shortness Of Breath, Swelling and Rash    Patient states ALL pain medications make her deathly sick.   Penicillins Shortness Of Breath, Swelling and Other (See Comments)    Throat swells 02/06/21--TOLERATES CEFTRIAXONE     Nickel Rash   Pregabalin Other (See Comments)   Acetaminophen     Per MD patient states she can't take Tylenol because of liver enzymes   Ativan [Lorazepam] Other (See Comments)    Migraines.   Strawberry Extract Swelling   Watermelon Concentrate Swelling   Albuterol Palpitations   Benadryl [Diphenhydramine Hcl] Palpitations   Humira [Adalimumab] Rash   Latex Rash   Remicade [Infliximab] Rash    Blisters and Welts    Tramadol Rash    Rash and itching    Patient Measurements: Height: 5' 2.99" (160 cm) Weight: 68.8 kg (151 lb 10.8 oz) IBW/kg (Calculated) : 52.38 Heparin Dosing Weight: 68.8  Vital Signs: Temp: 98.9 F (37.2 C) (10/17 2038) Temp Source: Axillary (10/17 2038) BP: 136/64 (10/17 2038) Pulse Rate: 110 (10/17 2038)  Labs: Recent Labs    02/15/23 0246 02/15/23 1206 02/16/23 0413 02/17/23 0321 02/17/23 1242 02/17/23 2148  HGB 10.1*  --  9.4* 9.3*  --   --   HCT 30.4*  --  28.6* 29.4*  --   --   PLT 618*  --  609* 672*  --   --   HEPARINUNFRC  --   --   --  <0.10* <0.10* <0.10*  CREATININE 0.48 0.55 0.48 0.37*  --   --     Estimated Creatinine Clearance: 75.7 mL/min (A) (by C-G formula based on SCr of 0.37 mg/dL  (L)).   Assessment: Patient with PMH of COPD, HTN, Crohn's, DM, GERD, asthma, anxiety, head injury, IBS, PTSD, PSVT, and seizure admitted on 9/26 after being found down at home with bloody emesis. She underwent CRP x 5 min with ROSC. She underwent a flexible sigmoidoscopy-she was found to have changes consistent with pseudomembranous colitis. 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 On CT AP 10/16 there was noted splenic infarcts and pulm emboli. Pharmacy consulted on 10/16 to dose heparin without boluses due to recent colectomy.   Heparin level remains undetectable on 1600 units/hr; no issues with infusion or overt s/sx of bleeding reported. CBC stable, Hgb 9.3  Goal of Therapy:  Heparin level 0.3-0.7 units/ml Monitor platelets by anticoagulation protocol: Yes   Plan:  No boluses per MD Increase heparin gtt to 1900 units/hr 6 hour heparin level   Christoper Fabian, PharmD, BCPS Please see amion for complete clinical pharmacist phone list 02/17/2023 10:47 PM

## 2023-02-17 NOTE — Progress Notes (Addendum)
  Informed Consent   Shared Decision Making/Informed Consent  The risks [esophageal damage, perforation (1:10,000 risk), bleeding, pharyngeal hematoma as well as other potential complications associated with conscious sedation including aspiration, arrhythmia, respiratory failure and death], benefits (treatment guidance and diagnostic support) and alternatives of a transesophageal echocardiogram were discussed in detail with [name] and she is willing to proceed.      NPO after midnight, TEE planned tomorrow.  Admitted for fulminant C diff colitis with frank necrosis with pelvic abscess, s/p total abdominal colectomy with ileostomy on 02/11/23.  TEE requested for Candidemia and septic emboli concerning for endocarditis. Note INR 1.5 on 01/27/23. Please replace for hypokalemia and hypomagnesemia.  Repeat BMP and Mag, and CBC in AM. Orders placed.

## 2023-02-17 NOTE — Progress Notes (Signed)
PHARMACY - ANTICOAGULATION CONSULT NOTE  Pharmacy Consult for heparin Indication: pulmonary embolus (no boluses per surgery due to recent colectomy)  Allergies  Allergen Reactions   Codeine Shortness Of Breath, Swelling and Rash    Throat swelling   Dilaudid [Hydromorphone Hcl] Shortness Of Breath and Swelling   Hydrocodone Shortness Of Breath, Swelling and Rash   Ketorolac Nausea And Vomiting and Swelling    Pt reports n/v and swelling to throat   Morphine And Codeine Shortness Of Breath, Swelling and Rash   Oxycodone Shortness Of Breath, Swelling and Rash    Patient states ALL pain medications make her deathly sick.   Penicillins Shortness Of Breath, Swelling and Other (See Comments)    Throat swells 02/06/21--TOLERATES CEFTRIAXONE     Nickel Rash   Pregabalin Other (See Comments)   Acetaminophen     Per MD patient states she can't take Tylenol because of liver enzymes   Ativan [Lorazepam] Other (See Comments)    Migraines.   Strawberry Extract Swelling   Watermelon Concentrate Swelling   Albuterol Palpitations   Benadryl [Diphenhydramine Hcl] Palpitations   Humira [Adalimumab] Rash   Latex Rash   Remicade [Infliximab] Rash    Blisters and Welts    Tramadol Rash    Rash and itching    Patient Measurements: Height: 5' 2.99" (160 cm) Weight: 68.8 kg (151 lb 10.8 oz) IBW/kg (Calculated) : 52.38 Heparin Dosing Weight: 68.8  Vital Signs: Temp: 98.7 F (37.1 C) (10/17 0251) Temp Source: Oral (10/17 0251) BP: 132/67 (10/17 0251) Pulse Rate: 126 (10/17 0251)  Labs: Recent Labs    02/15/23 0246 02/15/23 1206 02/16/23 0413 02/17/23 0321  HGB 10.1*  --  9.4* 9.3*  HCT 30.4*  --  28.6* 29.4*  PLT 618*  --  609* 672*  HEPARINUNFRC  --   --   --  <0.10*  CREATININE 0.48 0.55 0.48 0.37*    Estimated Creatinine Clearance: 75.7 mL/min (A) (by C-G formula based on SCr of 0.37 mg/dL (L)).   Medical History: Past Medical History:  Diagnosis Date   Anxiety     Asthma    Chronic low back pain    Collagen vascular disease (HCC)    COPD (chronic obstructive pulmonary disease) (HCC)    Crohn's disease (HCC)    GERD (gastroesophageal reflux disease)    EGD 01/2007 by Dr.Rourke small hiatal hernia s/p 56 french maloney    History of head injury    Hypertension    IBS (irritable bowel syndrome)    PSVT (paroxysmal supraventricular tachycardia) (HCC)    PTSD (post-traumatic stress disorder)    Recurrent chest pain    Seizure disorder (HCC)    Type 2 diabetes mellitus (HCC)     Medications:  Scheduled:   acetaminophen (TYLENOL) oral liquid 160 mg/5 mL  1,000 mg Per Tube Q6H   amitriptyline  25 mg Oral QHS   Chlorhexidine Gluconate Cloth  6 each Topical Daily   DULoxetine  30 mg Oral Daily   feeding supplement (PROSource TF20)  60 mL Per Tube QID   fentaNYL  1 patch Transdermal Q72H   ferrous sulfate  325 mg Oral BID WC   fiber supplement (BANATROL TF)  60 mL Per Tube BID   folic acid  1 mg Intravenous Daily   free water  50 mL Per Tube Q4H   gabapentin  100 mg Oral Q8H   Gerhardt's butt cream   Topical BID   insulin aspart  0-9 Units Subcutaneous  Q4H   lidocaine  2 patch Transdermal Q24H   loperamide  2 mg Oral QID   methocarbamol  1,000 mg Per Tube TID   multivitamin with minerals  1 tablet Oral Daily   mouth rinse  15 mL Mouth Rinse 4 times per day   pantoprazole (PROTONIX) IV  40 mg Intravenous Q24H   revefenacin  175 mcg Nebulization Daily   thiamine (VITAMIN B1) injection  100 mg Intravenous Daily   vancomycin  125 mg Per Tube QID   Vivonex RTF  1,000 mL Oral Q24H   Infusions:   cefTRIAXone (ROCEPHIN)  IV 2 g (02/16/23 2234)   dextrose 5 % and 0.45 % NaCl 50 mL/hr at 02/17/23 0311   heparin 1,200 Units/hr (02/16/23 2228)   levETIRAcetam 750 mg (02/17/23 0036)   metronidazole Stopped (02/16/23 1815)   micafungin (MYCAMINE) 100 mg in sodium chloride 0.9 % 100 mL IVPB 100 mg (02/17/23 0424)   vancomycin     PRN: dextrose,  fentaNYL (SUBLIMAZE) injection, hydrOXYzine, levalbuterol, LORazepam, magic mouthwash, metoprolol tartrate, ondansetron (ZOFRAN) IV  Assessment:  Patient with PMH of COPD, HTN, Crohn's, DM, GERD, asthma, anxiety, head injury, IBS, PTSD, PSVT, and seizure admitted on 9/26 after being found down at home with bloody emesis. She underwent CRP x 5 min with ROSC. She underwent a flexible sigmoidoscopy-she was found to have changes consistent with pseudomembranous colitis. 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 On CT AP 10/16 there was noted splenic infarcts and pulm emboli. Pharmacy consulted on 10/16 to dose heparin without boluses due to recent colectomy.   10/17 AM update:  Heparin level sub-therapeutic   Goal of Therapy:  Heparin level 0.3-0.7 units/ml Monitor platelets by anticoagulation protocol: Yes   Plan:  No boluses per [employer] heparin to 1400 units/hr 1200 heparin level  Abran Duke, PharmD, BCPS Clinical Pharmacist Phone: [phone]

## 2023-02-17 NOTE — Progress Notes (Signed)
Progress Note  6 Days Post-Op  Subjective: Pain stable, continued high ileostomy output. WBC increasing still. Evaluated wound with WOC.   Objective: Vital signs in last 24 hours: Temp:  [97.5 F (36.4 C)-98.7 F (37.1 C)] 97.6 F (36.4 C) (10/17 0828) Pulse Rate:  [126-138] 127 (10/17 0828) Resp:  [18-20] 20 (10/17 0828) BP: (132-144)/(53-70) 137/70 (10/17 0828) SpO2:  [91 %-94 %] 94 % (10/17 0828) Last BM Date : 02/16/23  Intake/Output from previous day: 10/16 0701 - 10/17 0700 In: 1668.6 [P.O.:520; I.V.:835.6; IV Piggyback:313] Out: 6645 [Urine:2400; Drains:45; Stool:4200] Intake/Output this shift: Total I/O In: -  Out: 200 [Stool:200]  PE: General: pleasant, WD, WN female who is laying in bed in NAD Heart: sinus tachycardia in the 120s Lungs: Respiratory effort nonlabored Abd: soft, appropriately ttp, midline with false bottom that was separated, VAC replaced, ostomy pink with large volume liquid, drain purulent    Lab Results:  Recent Labs    02/16/23 0413 02/17/23 0321  WBC 40.8* 44.4*  HGB 9.4* 9.3*  HCT 28.6* 29.4*  PLT 609* 672*   BMET Recent Labs    02/16/23 0413 02/17/23 0321  NA 138 136  K 3.8 3.3*  CL 101 97*  CO2 25 24  GLUCOSE 109* 134*  BUN <5* 5*  CREATININE 0.48 0.37*  CALCIUM 8.9 9.0   PT/INR No results for input(s): "LABPROT", "INR" in the last 72 hours. CMP     Component Value Date/Time   NA 136 02/17/2023 0321   K 3.3 (L) 02/17/2023 0321   CL 97 (L) 02/17/2023 0321   CO2 24 02/17/2023 0321   GLUCOSE 134 (H) 02/17/2023 0321   BUN 5 (L) 02/17/2023 0321   CREATININE 0.37 (L) 02/17/2023 0321   CREATININE 0.68 06/09/2021 1305   CALCIUM 9.0 02/17/2023 0321   PROT 5.3 (L) 02/17/2023 0321   ALBUMIN 2.6 (L) 02/17/2023 0321   AST 14 (L) 02/17/2023 0321   ALT 9 02/17/2023 0321   ALKPHOS 132 (H) 02/17/2023 0321   BILITOT 0.4 02/17/2023 0321   GFRNONAA >60 02/17/2023 0321   GFRAA >60 07/30/2019 1301   Lipase     Component  Value Date/Time   LIPASE 27 07/09/2022 1313       Studies/Results: CT ABDOMEN PELVIS W CONTRAST  Result Date: 02/16/2023 CLINICAL DATA:  Abdominal pain, postop EXAM: CT ABDOMEN AND PELVIS WITH CONTRAST TECHNIQUE: Multidetector CT imaging of the abdomen and pelvis was performed using the standard protocol following bolus administration of intravenous contrast. RADIATION DOSE REDUCTION: This exam was performed according to the departmental dose-optimization program which includes automated exposure control, adjustment of the mA and/or kV according to patient size and/or use of iterative reconstruction technique. CONTRAST:  75mL OMNIPAQUE IOHEXOL 350 MG/ML SOLN COMPARISON:  01/31/2023 FINDINGS: Lower chest: Filling defects seen within right lower lobe pulmonary arteries compatible with incidental pulmonary emboli. Small left pleural effusion with compressive atelectasis in the left lower lobe. Dependent atelectasis in the right lower lobe. Hepatobiliary: No focal liver abnormality is seen. Status post cholecystectomy. No biliary dilatation. Pancreas: No focal abnormality or ductal dilatation. Spleen: Linear and wedge-shaped hypodensities throughout the spleen most compatible with splenic infarcts. This is similar to prior study. Adrenals/Urinary Tract: No adrenal abnormality. No focal renal abnormality. No stones or hydronephrosis. Urinary bladder is unremarkable. Stomach/Bowel: Interval total colectomy. Feeding tube in place with the tip in the proximal duodenum. Small bowel decompressed. Right lower quadrant ostomy noted. There is edema and fluid throughout the small bowel mesentery  with surgical drains in place. Vascular/Lymphatic: No evidence of aneurysm or adenopathy. Reproductive: Prior hysterectomy.  No adnexal masses. Other: There is edema/fluid throughout the small bowel mesentery, likely postoperative. Few locules of extraluminal gas also noted, also presumably from recent surgery. Surgical drain  in place in the mid and left pelvis. Musculoskeletal: No acute bony abnormality. IMPRESSION: Incidental small pulmonary emboli seen in the right lower lobe pulmonary arterial branches. Interval colectomy. Right lower quadrant ostomy noted. No evidence of bowel obstruction. Edema/fluid throughout the mesentery, presumably postoperative. Few locules of free air in the abdomen, also likely postoperative. Small left pleural effusion with compressive atelectasis in the left lower lobe. 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 These are similar to prior study. These results will be called to the ordering clinician or representative by the Radiologist Assistant, and communication documented in the PACS or [employer]. Electronically Signed   By: Charlett Nose M.D.   On: 02/16/2023 19:37   [place] EKG SITE RITE  Result Date: 02/16/2023 If Site Rite image not attached, placement could not be confirmed due to current cardiac rhythm.   Anti-infectives: Anti-infectives (From admission, onward)    Start     Dose/Rate Route Frequency Ordered Stop   02/18/23 0400  micafungin (MYCAMINE) 150 mg in sodium chloride 0.9 % 100 mL IVPB        150 mg 107.5 mL/hr over 1 Hours Intravenous Every 24 hours 02/17/23 0936     02/17/23 0900  vancomycin (VANCOREADY) IVPB 750 mg/150 mL  Status:  Discontinued        750 mg 150 mL/hr over 60 Minutes Intravenous Every 8 hours 02/17/23 0204 02/17/23 0947   02/17/23 0400  micafungin (MYCAMINE) 100 mg in sodium chloride 0.9 % 100 mL IVPB  Status:  Discontinued        100 mg 105 mL/hr over 1 Hours Intravenous Every 24 hours 02/16/23 0353 02/17/23 0936   02/16/23 1045  vancomycin (VANCOCIN) 50 mg/mL oral solution SOLN 125 mg        125 mg Per Tube 4 times daily 02/16/23 0953 02/26/23 0959   02/16/23 0415  micafungin (MYCAMINE) 100 mg in sodium chloride 0.9 % 100 mL IVPB        100 mg 105 mL/hr over 1 Hours Intravenous STAT 02/16/23  0353 02/16/23 0610   02/15/23 1300  vancomycin (VANCOREADY) IVPB 750 mg/150 mL  Status:  Discontinued        750 mg 150 mL/hr over 60 Minutes Intravenous Every 12 hours 02/14/23 1156 02/17/23 0204   02/14/23 1130  vancomycin (VANCOCIN) IVPB 1000 mg/200 mL premix        1,000 mg 200 mL/hr over 60 Minutes Intravenous  Once 02/14/23 1043 02/14/23 1249   02/11/23 2000  cefTRIAXone (ROCEPHIN) 2 g in sodium chloride 0.9 % 100 mL IVPB       Note to Pharmacy: Pharmacy may adjust dosing strength / duration / interval for maximal efficacy   2 g 200 mL/hr over 30 Minutes Intravenous Every 24 hours 02/11/23 1622     02/10/23 1600  metroNIDAZOLE (FLAGYL) IVPB 500 mg        500 mg 100 mL/hr over 60 Minutes Intravenous Every 12 hours 02/10/23 1444     02/08/23 1800  vancomycin (VANCOCIN) 50 mg/mL oral solution SOLN 500 mg        500 mg Per Tube Every 6 hours 02/08/23 1325 02/12/23 1759   02/03/23 2200  metroNIDAZOLE (FLAGYL) tablet  500 mg        500 mg Per Tube Every 12 hours 02/03/23 1406 02/06/23 2148   02/02/23 1815  vancomycin (VANCOCIN) 50 mg/mL oral solution SOLN 125 mg  Status:  Discontinued        125 mg Per Tube Every 6 hours 02/02/23 1717 02/08/23 1325   02/02/23 1545  vancomycin (VANCOCIN) capsule 125 mg  Status:  Discontinued        125 mg Oral 4 times daily 02/02/23 1530 02/02/23 1717   01/27/23 1430  cefTRIAXone (ROCEPHIN) 2 g in sodium chloride 0.9 % 100 mL IVPB        2 g 200 mL/hr over 30 Minutes Intravenous Every 24 hours 01/27/23 1340 02/05/23 1403   01/27/23 1400  metroNIDAZOLE (FLAGYL) IVPB 500 mg  Status:  Discontinued        500 mg 100 mL/hr over 60 Minutes Intravenous Every 12 hours 01/27/23 1340 02/03/23 1406   01/27/23 0545  vancomycin (VANCOREADY) IVPB 1250 mg/250 mL        1,250 mg 166.7 mL/hr over 90 Minutes Intravenous  Once 01/27/23 0542 01/27/23 0809   01/27/23 0545  ceFEPIme (MAXIPIME) 2 g in sodium chloride 0.9 % 100 mL IVPB        2 g 200 mL/hr over 30 Minutes  Intravenous  Once 01/27/23 0542 01/27/23 0621   01/27/23 0545  metroNIDAZOLE (FLAGYL) IVPB 500 mg        500 mg 100 mL/hr over 60 Minutes Intravenous  Once 01/27/23 0542 01/27/23 0741        Assessment/Plan Refractory Crohn's disease 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 Luisa Hart  - Leukocytosis uptrending to 44 today  - no signs of undrained abdominal collection on CT 10/16 - Discussed with ID - may need chest CT and TEE to eval for valvular vegetation given splenic infarcts and PEs - septic emboli  - ok to advance to soft diet, increase fiber to QID, continue imodium and iron as well to try to control output  - no PICC/TPN in setting of bacteremia/possible fungemia  - Continue drain - continue medical treatment for C.diff - mobilize - WOC following for new ileostomy and VAC - ok to change VAC M/R  FEN: soft diet, elemental trickle TF, supplements per RD VTE: heparin gtt ID: rocephin/flagyl/micafungin, PO vanc  - per CCM -  Acute PEs - heparin gtt, ?etiology  Splenic infarcts - nothing surgical recommended at this time but agree with ID need to eval etiology of emboli  COPD HTN Chronic pain  Collagen vascular disease T2DM Seizure disorder  LOS: 21 days     Juliet Rude, [facility] 02/17/2023, 10:05 AM Please see Amion for pager number during day hours 7:00am-4:30pm

## 2023-02-17 NOTE — Progress Notes (Signed)
Patient with order for CT scan that requires 20g IV catheter placed within 3days. IV team assessed patient BUE using ultrasound today and found no suitable vein for PIV at this time; veins possible for midline. After discussion with attending MD and Infectious disease- they made recommendation to avoid PICC/midline at this time and postpone scan since patient already on heparin and antibiotic infusions.   Once providers ready to try again for scan/okay for midline, please reconsult IV Team to try for access at that time.

## 2023-02-17 NOTE — Progress Notes (Signed)
Nutrition Follow-up  DOCUMENTATION CODES:   Not applicable  INTERVENTION:   Pt with liquids meds per tube; these tend to be very high osmolality and may be contributing to increased stool output. Recommend changing formulation or route if able  Continue to recommend initiation of TPN when safe to place PICC Line  Soft diet as tolerated  Tube Feeding via Cortrak: Vivonex at 20 ml/hr  Hold Pro-source TF20 60 mL QID for now; ? If this may be contributing to increase output. Plan to resume if no improvement with stopping  Increase BanatrolTF to 4 x daily; each pack contains 5 g of soluble fiber  Consider addition of ORS such as Pedialyte or Gatorlyte to replace high water and electrolytes losses with Ileostomy  NUTRITION DIAGNOSIS:   Inadequate oral intake related to inability to eat as evidenced by NPO status.  Continues  GOAL:   Patient will meet greater than or equal to 90% of their needs  Not met but addressing  MONITOR:   Diet advancement, Skin, Labs, Weight trends, TF tolerance  REASON FOR ASSESSMENT:   Consult Assessment of nutrition requirement/status  ASSESSMENT:   52 year old female with past medical history of COPD, Crohn's on [place], hypertension, type 2 diabetes mellitus who initially presented to the after being found unresponsive.  Diet advanced to Soft by Surgery today in hopes that soft solid food intake might assist with reducing ileostomy output. Discussed oral nutrition supplements with Surgery and agree to hold on these at present as our ONS may actually further increase output.   Vivonex continues at 20 ml/hr via Cortrak  >4L stool output in 24  hours; 680 mL plus 2 unmeasured occurrences today  Discussed increasing Banatrol TF to QID with Surgery who agrees with this plan to see if this will help bulk up stool  WBC trending up  Labs: sodium 136 (wdl), potassium 3.3 (L), magnesium  Meds: reviewed    Diet Order:   Diet Order              Diet NPO time specified Except for: Sips with Meds  Diet effective midnight           DIET SOFT Room service appropriate? Yes; Fluid consistency: Thin  Diet effective now                   EDUCATION NEEDS:   Not appropriate for education at this time  Skin:  Skin Assessment: Skin Integrity Issues: Skin Integrity Issues:: Stage II, Wound VAC, Other (Comment) Stage II: lip (ETT holder) Wound Vac: abdomen (closed surgical incision) Other: MASD to perineum  Last BM:  Ileostomy with 975 mL in 24 hours  Height:   Ht Readings from Last 1 Encounters:  02/02/23 5' 2.99" (1.6 m)    Weight:   Wt Readings from Last 1 Encounters:  02/16/23 68.8 kg    Ideal Body Weight:  52.3 kg  BMI:  Body mass index is 26.88 kg/m.  Estimated Nutritional Needs:   Kcal:  1700-1900 kcal/d  Protein:  85-100 g/d  Fluid:  >1.8L/d   Romelle Starcher MS, RDN, LDN, CNSC Registered Dietitian 3 Clinical Nutrition RD Pager and On-Call Pager Number Located in [place]

## 2023-02-17 NOTE — Progress Notes (Signed)
PHARMACY - ANTICOAGULATION CONSULT NOTE  Pharmacy Consult for heparin Indication: pulmonary embolus (no boluses per surgery due to recent colectomy)  Allergies  Allergen Reactions   Codeine Shortness Of Breath, Swelling and Rash    Throat swelling   Dilaudid [Hydromorphone Hcl] Shortness Of Breath and Swelling   Hydrocodone Shortness Of Breath, Swelling and Rash   Ketorolac Nausea And Vomiting and Swelling    Pt reports n/v and swelling to throat   Morphine And Codeine Shortness Of Breath, Swelling and Rash   Oxycodone Shortness Of Breath, Swelling and Rash    Patient states ALL pain medications make her deathly sick.   Penicillins Shortness Of Breath, Swelling and Other (See Comments)    Throat swells 02/06/21--TOLERATES CEFTRIAXONE     Nickel Rash   Pregabalin Other (See Comments)   Acetaminophen     Per MD patient states she can't take Tylenol because of liver enzymes   Ativan [Lorazepam] Other (See Comments)    Migraines.   Strawberry Extract Swelling   Watermelon Concentrate Swelling   Albuterol Palpitations   Benadryl [Diphenhydramine Hcl] Palpitations   Humira [Adalimumab] Rash   Latex Rash   Remicade [Infliximab] Rash    Blisters and Welts    Tramadol Rash    Rash and itching    Patient Measurements: Height: 5' 2.99" (160 cm) Weight: 68.8 kg (151 lb 10.8 oz) IBW/kg (Calculated) : 52.38 Heparin Dosing Weight: 68.8  Vital Signs: Temp: 97.4 F (36.3 C) (10/17 1235) Temp Source: Oral (10/17 1235) BP: 140/74 (10/17 1235) Pulse Rate: 126 (10/17 1235)  Labs: Recent Labs    02/15/23 0246 02/15/23 1206 02/16/23 0413 02/17/23 0321 02/17/23 1242  HGB 10.1*  --  9.4* 9.3*  --   HCT 30.4*  --  28.6* 29.4*  --   PLT 618*  --  609* 672*  --   HEPARINUNFRC  --   --   --  <0.10* <0.10*  CREATININE 0.48 0.55 0.48 0.37*  --     Estimated Creatinine Clearance: 75.7 mL/min (A) (by C-G formula based on SCr of 0.37 mg/dL (L)).   Medical History: Past Medical  History:  Diagnosis Date   Anxiety    Asthma    Chronic low back pain    Collagen vascular disease (HCC)    COPD (chronic obstructive pulmonary disease) (HCC)    Crohn's disease (HCC)    GERD (gastroesophageal reflux disease)    EGD 01/2007 by Dr.Rourke small hiatal hernia s/p 56 french maloney    History of head injury    Hypertension    IBS (irritable bowel syndrome)    PSVT (paroxysmal supraventricular tachycardia) (HCC)    PTSD (post-traumatic stress disorder)    Recurrent chest pain    Seizure disorder (HCC)    Type 2 diabetes mellitus (HCC)     Medications:  Scheduled:   acetaminophen (TYLENOL) oral liquid 160 mg/5 mL  1,000 mg Per Tube Q6H   amitriptyline  25 mg Oral QHS   Chlorhexidine Gluconate Cloth  6 each Topical Daily   DULoxetine  30 mg Oral Daily   feeding supplement (PROSource TF20)  60 mL Per Tube QID   fentaNYL  1 patch Transdermal Q72H   ferrous sulfate  325 mg Oral BID WC   fiber supplement (BANATROL TF)  60 mL Per Tube QID   folic acid  1 mg Intravenous Daily   gabapentin  100 mg Oral Q8H   Gerhardt's butt cream   Topical BID   insulin  aspart  0-9 Units Subcutaneous Q4H   lidocaine  2 patch Transdermal Q24H   loperamide  2 mg Oral QID   methocarbamol  1,000 mg Per Tube TID   multivitamin with minerals  1 tablet Oral Daily   mouth rinse  15 mL Mouth Rinse 4 times per day   pantoprazole (PROTONIX) IV  40 mg Intravenous Q24H   potassium chloride  40 mEq Oral BID   revefenacin  175 mcg Nebulization Daily   thiamine (VITAMIN B1) injection  100 mg Intravenous Daily   vancomycin  125 mg Per Tube QID   Vivonex RTF  1,000 mL Oral Q24H   Infusions:   cefTRIAXone (ROCEPHIN)  IV 2 g (02/16/23 2234)   heparin 1,400 Units/hr (02/17/23 0600)   levETIRAcetam 750 mg (02/17/23 0858)   metronidazole 500 mg (02/17/23 0623)   [START ON 02/18/2023] micafungin (MYCAMINE) 150 mg in sodium chloride 0.9 % 100 mL IVPB     PRN: dextrose, fentaNYL (SUBLIMAZE) injection,  hydrOXYzine, levalbuterol, LORazepam, magic mouthwash, metoprolol tartrate, ondansetron (ZOFRAN) IV  Assessment:  Patient with PMH of COPD, HTN, Crohn's, DM, GERD, asthma, anxiety, head injury, IBS, PTSD, PSVT, and seizure admitted on 9/26 after being found down at home with bloody emesis. She underwent CRP x 5 min with ROSC. She underwent a flexible sigmoidoscopy-she was found to have changes consistent with pseudomembranous colitis. 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 On CT AP 10/16 there was noted splenic infarcts and pulm emboli. Pharmacy consulted on 10/16 to dose heparin without boluses due to recent colectomy.   Heparin level undetectable on 1400 units/hr; no issues with infusion or overt s/sx of bleeding reported. CBC stable, Hgb 9.3    Goal of Therapy:  Heparin level 0.3-0.7 units/ml Monitor platelets by anticoagulation protocol: Yes   Plan:  No boluses per MD Increase heparin gtt to 1600 units/hr 8 hour heparin level   Ruben Im, PharmD Clinical Pharmacist 02/17/2023 1:42 PM Please check AMION for all [facility] numbers

## 2023-02-17 NOTE — Plan of Care (Signed)
  Problem: Clinical Measurements: Goal: Will remain free from infection Outcome: Progressing   Problem: Clinical Measurements: Goal: Diagnostic test results will improve Outcome: Progressing   Problem: Clinical Measurements: Goal: Respiratory complications will improve Outcome: Progressing   Problem: Clinical Measurements: Goal: Cardiovascular complication will be avoided Outcome: Progressing   Problem: Nutrition: Goal: Adequate nutrition will be maintained Outcome: Progressing

## 2023-02-17 NOTE — Progress Notes (Signed)
Patient is conscious and oriented *4, hemodynamically: she is afebrile, blood pressure and RR within the normal limit and HR>100 b/min. She is been receiving tube feeding as per the order and prescribed medications administered. Type 7 stool continue from her ileostomy, external urinary catheter continue, repositioning done every 2 hourly, PRN analgesics given for pain management, kept on close monitoring, call bell in reach.

## 2023-02-17 NOTE — Progress Notes (Signed)
Received consult for PIV placement. Attempted x 2 to place PIV in L arm and placed 22g 1.75 successfully in L forearm. No other appropriate veins found at this time for PIV placement in either arm.

## 2023-02-17 NOTE — Progress Notes (Signed)
Subjective: No new complaints   Antibiotics:  Anti-infectives (From admission, onward)    Start     Dose/Rate Route Frequency Ordered Stop   02/18/23 0400  micafungin (MYCAMINE) 150 mg in sodium chloride 0.9 % 100 mL IVPB        150 mg 107.5 mL/hr over 1 Hours Intravenous Every 24 hours 02/17/23 0936     02/17/23 0900  vancomycin (VANCOREADY) IVPB 750 mg/150 mL  Status:  Discontinued        750 mg 150 mL/hr over 60 Minutes Intravenous Every 8 hours 02/17/23 0204 02/17/23 0947   02/17/23 0400  micafungin (MYCAMINE) 100 mg in sodium chloride 0.9 % 100 mL IVPB  Status:  Discontinued        100 mg 105 mL/hr over 1 Hours Intravenous Every 24 hours 02/16/23 0353 02/17/23 0936   02/16/23 1045  vancomycin (VANCOCIN) 50 mg/mL oral solution SOLN 125 mg        125 mg Per Tube 4 times daily 02/16/23 0953 02/26/23 0959   02/16/23 0415  micafungin (MYCAMINE) 100 mg in sodium chloride 0.9 % 100 mL IVPB        100 mg 105 mL/hr over 1 Hours Intravenous STAT 02/16/23 0353 02/16/23 0610   02/15/23 1300  vancomycin (VANCOREADY) IVPB 750 mg/150 mL  Status:  Discontinued        750 mg 150 mL/hr over 60 Minutes Intravenous Every 12 hours 02/14/23 1156 02/17/23 0204   02/14/23 1130  vancomycin (VANCOCIN) IVPB 1000 mg/200 mL premix        1,000 mg 200 mL/hr over 60 Minutes Intravenous  Once 02/14/23 1043 02/14/23 1249   02/11/23 2000  cefTRIAXone (ROCEPHIN) 2 g in sodium chloride 0.9 % 100 mL IVPB       Note to Pharmacy: Pharmacy may adjust dosing strength / duration / interval for maximal efficacy   2 g 200 mL/hr over 30 Minutes Intravenous Every 24 hours 02/11/23 1622     02/10/23 1600  metroNIDAZOLE (FLAGYL) IVPB 500 mg        500 mg 100 mL/hr over 60 Minutes Intravenous Every 12 hours 02/10/23 1444     02/08/23 1800  vancomycin (VANCOCIN) 50 mg/mL oral solution SOLN 500 mg        500 mg Per Tube Every 6 hours 02/08/23 1325 02/12/23 1759   02/03/23 2200  metroNIDAZOLE (FLAGYL) tablet 500  mg        500 mg Per Tube Every 12 hours 02/03/23 1406 02/06/23 2148   02/02/23 1815  vancomycin (VANCOCIN) 50 mg/mL oral solution SOLN 125 mg  Status:  Discontinued        125 mg Per Tube Every 6 hours 02/02/23 1717 02/08/23 1325   02/02/23 1545  vancomycin (VANCOCIN) capsule 125 mg  Status:  Discontinued        125 mg Oral 4 times daily 02/02/23 1530 02/02/23 1717   01/27/23 1430  cefTRIAXone (ROCEPHIN) 2 g in sodium chloride 0.9 % 100 mL IVPB        2 g 200 mL/hr over 30 Minutes Intravenous Every 24 hours 01/27/23 1340 02/05/23 1403   01/27/23 1400  metroNIDAZOLE (FLAGYL) IVPB 500 mg  Status:  Discontinued        500 mg 100 mL/hr over 60 Minutes Intravenous Every 12 hours 01/27/23 1340 02/03/23 1406   01/27/23 0545  vancomycin (VANCOREADY) IVPB 1250 mg/250 mL        1,250 mg 166.7  mL/hr over 90 Minutes Intravenous  Once 01/27/23 0542 01/27/23 0809   01/27/23 0545  ceFEPIme (MAXIPIME) 2 g in sodium chloride 0.9 % 100 mL IVPB        2 g 200 mL/hr over 30 Minutes Intravenous  Once 01/27/23 0542 01/27/23 0621   01/27/23 0545  metroNIDAZOLE (FLAGYL) IVPB 500 mg        500 mg 100 mL/hr over 60 Minutes Intravenous  Once 01/27/23 0542 01/27/23 0741       Medications: Scheduled Meds:  acetaminophen (TYLENOL) oral liquid 160 mg/5 mL  1,000 mg Per Tube Q6H   amitriptyline  25 mg Oral QHS   Chlorhexidine Gluconate Cloth  6 each Topical Daily   DULoxetine  30 mg Oral Daily   fentaNYL  1 patch Transdermal Q72H   ferrous sulfate  325 mg Oral BID WC   fiber supplement (BANATROL TF)  60 mL Per Tube QID   folic acid  1 mg Intravenous Daily   gabapentin  100 mg Oral Q8H   Gerhardt's butt cream   Topical BID   insulin aspart  0-9 Units Subcutaneous Q4H   lidocaine  2 patch Transdermal Q24H   loperamide  2 mg Oral QID   methocarbamol  1,000 mg Per Tube TID   multivitamin with minerals  1 tablet Oral Daily   mouth rinse  15 mL Mouth Rinse 4 times per day   pantoprazole (PROTONIX) IV  40 mg  Intravenous Q24H   potassium chloride  40 mEq Oral BID   revefenacin  175 mcg Nebulization Daily   thiamine (VITAMIN B1) injection  100 mg Intravenous Daily   vancomycin  125 mg Per Tube QID   Vivonex RTF  1,000 mL Oral Q24H   Continuous Infusions:  cefTRIAXone (ROCEPHIN)  IV 2 g (02/16/23 2234)   heparin 1,400 Units/hr (02/17/23 0600)   levETIRAcetam 750 mg (02/17/23 0858)   metronidazole 500 mg (02/17/23 0623)   [START ON 02/18/2023] micafungin (MYCAMINE) 150 mg in sodium chloride 0.9 % 100 mL IVPB     PRN Meds:.dextrose, fentaNYL (SUBLIMAZE) injection, hydrOXYzine, levalbuterol, LORazepam, magic mouthwash, metoprolol tartrate, ondansetron (ZOFRAN) IV    Objective: Weight change:   Intake/Output Summary (Last 24 hours) at 02/17/2023 1442 Last data filed at 02/17/2023 1426 Gross per 24 hour  Intake 2148.59 ml  Output 5735 ml  Net -3586.41 ml   Blood pressure (!) 140/74, pulse (!) 126, temperature (!) 97.4 F (36.3 C), temperature source Oral, resp. rate 20, height 5' 2.99" (1.6 m), weight 68.8 kg, SpO2 93%. Temp:  [97.4 F (36.3 C)-98.7 F (37.1 C)] 97.4 F (36.3 C) (10/17 1235) Pulse Rate:  [126-138] 126 (10/17 1235) Resp:  [19-20] 20 (10/17 1235) BP: (132-140)/(53-74) 140/74 (10/17 1235) SpO2:  [92 %-94 %] 93 % (10/17 1235)  Physical Exam: Physical Exam Constitutional:      General: She is not in acute distress.    Appearance: She is not diaphoretic.  HENT:     Head: Normocephalic and atraumatic.     Right Ear: External ear normal.     Left Ear: External ear normal.     Mouth/Throat:     Pharynx: No oropharyngeal exudate.  Eyes:     General: No scleral icterus.    Conjunctiva/sclera: Conjunctivae normal.     Pupils: Pupils are equal, round, and reactive to light.  Cardiovascular:     Rate and Rhythm: Normal rate and regular rhythm.     Heart sounds: Normal heart sounds. No  murmur heard.    No friction rub. No gallop.  Pulmonary:     Effort: Pulmonary  effort is normal. No respiratory distress.     Breath sounds: Normal breath sounds. No wheezing or rales.  Abdominal:     Palpations: Abdomen is soft.     Comments:  Ostomy present with liquid stool  Vacuum dressing in place  Musculoskeletal:        General: No tenderness. Normal range of motion.  Skin:    General: Skin is warm and dry.     Coloration: Skin is not pale.     Findings: No erythema or rash.  Neurological:     General: No focal deficit present.     Mental Status: She is alert and oriented to person, place, and time.     Motor: No abnormal muscle tone.     Coordination: Coordination normal.  Psychiatric:        Mood and Affect: Mood normal.        Behavior: Behavior normal.        Thought Content: Thought content normal.        Judgment: Judgment normal.      CBC:    BMET Recent Labs    02/16/23 0413 02/17/23 0321  NA 138 136  K 3.8 3.3*  CL 101 97*  CO2 25 24  GLUCOSE 109* 134*  BUN <5* 5*  CREATININE 0.48 0.37*  CALCIUM 8.9 9.0     Liver Panel  Recent Labs    02/16/23 0413 02/17/23 0321  PROT 5.3* 5.3*  ALBUMIN 2.9* 2.6*  AST 14* 14*  ALT 8 9  ALKPHOS 135* 132*  BILITOT 0.5 0.4       Sedimentation Rate No results for input(s): "ESRSEDRATE" in the last 72 hours. C-Reactive Protein No results for input(s): "CRP" in the last 72 hours.  Micro Results: Recent Results (from the past 720 hour(s))  Blood culture (routine x 2)     Status: Abnormal   Collection Time: 01/27/23  6:15 AM   Specimen: BLOOD  Result Value Ref Range Status   Specimen Description   Final    BLOOD CENT CENTRAL LINE Performed at [facility], [address] [place], [place] [address]    Special Requests   Final    BOTTLES DRAWN AEROBIC AND ANAEROBIC Blood Culture adequate volume Performed at [facility], [address], [place], [place] [address]    Culture  Setup Time   Final    GRAM POSITIVE COCCI ANAEROBIC BOTTLE ONLY CRITICAL RESULT CALLED TO, READ  BACK BY AND VERIFIED WITH: SAMANTHA IZQUIERDO AT 0981 01/28/2023 BY T KENNEDY CRITICAL RESULT CALLED TO, READ BACK BY AND VERIFIED WITH: PHARMD W Dareen Piano [id number] AT 1011 BY CM    Culture (A)  Final    STAPHYLOCOCCUS HOMINIS THE SIGNIFICANCE OF ISOLATING THIS ORGANISM FROM A SINGLE SET OF BLOOD CULTURES WHEN MULTIPLE SETS ARE DRAWN IS UNCERTAIN. PLEASE NOTIFY THE MICROBIOLOGY DEPARTMENT WITHIN ONE WEEK IF SPECIATION AND SENSITIVITIES ARE REQUIRED. Performed at [facility], [address] [place], [place] [address]    Report Status 02/01/2023 FINAL  Final  Blood culture (routine x 2)     Status: None   Collection Time: 01/27/23  6:15 AM   Specimen: BLOOD  Result Value Ref Range Status   Specimen Description BLOOD BLOOD LEFT HAND  Final   Special Requests   Final    BOTTLES DRAWN AEROBIC ONLY Blood Culture results may not be optimal  due to an inadequate volume of blood received in culture bottles   Culture   Final    NO GROWTH 5 DAYS Performed at [facility], [address], [place], [place] [address]    Report Status 02/01/2023 FINAL  Final  Blood Culture ID Panel (Reflexed)     Status: Abnormal   Collection Time: 01/27/23  6:15 AM  Result Value Ref Range Status   Enterococcus faecalis NOT DETECTED NOT DETECTED Final   Enterococcus Faecium NOT DETECTED NOT DETECTED Final   Listeria monocytogenes NOT DETECTED NOT DETECTED Final   Staphylococcus species DETECTED (A) NOT DETECTED Final    Comment: CRITICAL RESULT CALLED TO, READ BACK BY AND VERIFIED WITH: Marita Snellen [id number] AT 1012 BY CM    Staphylococcus aureus (BCID) NOT DETECTED NOT DETECTED Final   Staphylococcus epidermidis NOT DETECTED NOT DETECTED Final   Staphylococcus lugdunensis NOT DETECTED NOT DETECTED Final   Streptococcus species NOT DETECTED NOT DETECTED Final   Streptococcus agalactiae NOT DETECTED NOT DETECTED Final   Streptococcus pneumoniae NOT DETECTED NOT DETECTED Final   Streptococcus pyogenes NOT  DETECTED NOT DETECTED Final   A.calcoaceticus-baumannii NOT DETECTED NOT DETECTED Final   Bacteroides fragilis NOT DETECTED NOT DETECTED Final   Enterobacterales NOT DETECTED NOT DETECTED Final   Enterobacter cloacae complex NOT DETECTED NOT DETECTED Final   Escherichia coli NOT DETECTED NOT DETECTED Final   Klebsiella aerogenes NOT DETECTED NOT DETECTED Final   Klebsiella oxytoca NOT DETECTED NOT DETECTED Final   Klebsiella pneumoniae NOT DETECTED NOT DETECTED Final   Proteus species NOT DETECTED NOT DETECTED Final   Salmonella species NOT DETECTED NOT DETECTED Final   Serratia marcescens NOT DETECTED NOT DETECTED Final   Haemophilus influenzae NOT DETECTED NOT DETECTED Final   Neisseria meningitidis NOT DETECTED NOT DETECTED Final   Pseudomonas aeruginosa NOT DETECTED NOT DETECTED Final   Stenotrophomonas maltophilia NOT DETECTED NOT DETECTED Final   Candida albicans NOT DETECTED NOT DETECTED Final   Candida auris NOT DETECTED NOT DETECTED Final   Candida glabrata NOT DETECTED NOT DETECTED Final   Candida krusei NOT DETECTED NOT DETECTED Final   Candida parapsilosis NOT DETECTED NOT DETECTED Final   Candida tropicalis NOT DETECTED NOT DETECTED Final   Cryptococcus neoformans/gattii NOT DETECTED NOT DETECTED Final    Comment: Performed at [facility], [address], [place], [place] [address]  Resp panel by RT-PCR (RSV, Flu A&B, Covid) Anterior Nasal Swab     Status: None   Collection Time: 01/27/23  9:23 AM   Specimen: Anterior Nasal Swab  Result Value Ref Range Status   SARS Coronavirus 2 by RT PCR NEGATIVE NEGATIVE Final    Comment: (NOTE) SARS-CoV-2 target nucleic acids are NOT DETECTED.  The SARS-CoV-2 RNA is generally detectable in upper respiratory specimens during the acute phase of infection. The lowest concentration of SARS-CoV-2 viral copies this assay can detect is 138 copies/mL. A negative result does not preclude SARS-Cov-2 infection and should not be used  as the sole basis for treatment or other patient management decisions. A negative result may occur with  improper specimen collection/handling, submission of specimen other than nasopharyngeal swab, presence of viral mutation(s) within the areas targeted by this assay, and inadequate number of viral copies(<138 copies/mL). A negative result must be combined with clinical observations, patient history, and epidemiological information. The expected result is Negative.  Fact Sheet for Patients:  BloggerCourse.com  Fact Sheet for Healthcare Providers:  SeriousBroker.it  This test is no t  yet approved or cleared by the [place] and  has been authorized for detection and/or diagnosis of SARS-CoV-2 by FDA under an Emergency Use Authorization (EUA). This EUA will remain  in effect (meaning this test can be used) for the duration of the COVID-19 declaration under Section 564(b)(1) of the Act, 21 U.S.C.section 360bbb-3(b)(1), unless the authorization is terminated  or revoked sooner.       Influenza A by PCR NEGATIVE NEGATIVE Final   Influenza B by PCR NEGATIVE NEGATIVE Final    Comment: (NOTE) The Xpert Xpress SARS-CoV-2/FLU/RSV plus assay is intended as an aid in the diagnosis of influenza from Nasopharyngeal swab specimens and should not be used as a sole basis for treatment. Nasal washings and aspirates are unacceptable for Xpert Xpress SARS-CoV-2/FLU/RSV testing.  Fact Sheet for Patients: BloggerCourse.com  Fact Sheet for Healthcare Providers: SeriousBroker.it  This test is not yet approved or cleared by the [place] FDA and has been authorized for detection and/or diagnosis of SARS-CoV-2 by FDA under an Emergency Use Authorization (EUA). This EUA will remain in effect (meaning this test can be used) for the duration of the COVID-19 declaration under Section 564(b)(1)  of the Act, 21 U.S.C. section 360bbb-3(b)(1), unless the authorization is terminated or revoked.     Resp Syncytial Virus by PCR NEGATIVE NEGATIVE Final    Comment: (NOTE) Fact Sheet for Patients: BloggerCourse.com  Fact Sheet for Healthcare Providers: SeriousBroker.it  This test is not yet approved or cleared by the [place] FDA and has been authorized for detection and/or diagnosis of SARS-CoV-2 by FDA under an Emergency Use Authorization (EUA). This EUA will remain in effect (meaning this test can be used) for the duration of the COVID-19 declaration under Section 564(b)(1) of the Act, 21 U.S.C. section 360bbb-3(b)(1), unless the authorization is terminated or revoked.  Performed at [facility], [address], [place], [place] [address]   MRSA Next Gen by PCR, Nasal     Status: None   Collection Time: 01/27/23 10:53 AM   Specimen: Nasal Mucosa; Nasal Swab  Result Value Ref Range Status   MRSA by PCR Next Gen NOT DETECTED NOT DETECTED Final    Comment: (NOTE) The GeneXpert MRSA Assay (FDA approved for NASAL specimens only), is one component of a comprehensive MRSA colonization surveillance program. It is not intended to diagnose MRSA infection nor to guide or monitor treatment for MRSA infections. Test performance is not FDA approved in patients less than 47 years old. Performed at [facility], [address], [place], [place] [address]   Calprotectin, Fecal     Status: Abnormal   Collection Time: 01/30/23  3:31 AM   Specimen: Stool  Result Value Ref Range Status   Calprotectin, Fecal 562 (H) 0 - 120 ug/g Final    Comment: (NOTE) Concentration     Interpretation   Follow-Up < 5 - 50 ug/g     Normal           None >50 -120 ug/g     Borderline       Re-evaluate in 4-6 weeks    >120 ug/g     Abnormal         Repeat as clinically                                   indicated Performed At: [facility] [address] [place], [place] [id number]  Jolene Schimke MD GM:[id number]   Gastrointestinal Panel by PCR , Stool     Status: None   Collection Time: 01/30/23  3:31 AM   Specimen: Stool  Result Value Ref Range Status   Campylobacter species NOT DETECTED NOT DETECTED Final   Plesimonas shigelloides NOT DETECTED NOT DETECTED Final   Salmonella species NOT DETECTED NOT DETECTED Final   Yersinia enterocolitica NOT DETECTED NOT DETECTED Final   Vibrio species NOT DETECTED NOT DETECTED Final   Vibrio cholerae NOT DETECTED NOT DETECTED Final   Enteroaggregative E coli (EAEC) NOT DETECTED NOT DETECTED Final   Enteropathogenic E coli (EPEC) NOT DETECTED NOT DETECTED Final   Enterotoxigenic E coli (ETEC) NOT DETECTED NOT DETECTED Final   Shiga like toxin producing E coli (STEC) NOT DETECTED NOT DETECTED Final   Shigella/Enteroinvasive E coli (EIEC) NOT DETECTED NOT DETECTED Final   Cryptosporidium NOT DETECTED NOT DETECTED Final   Cyclospora cayetanensis NOT DETECTED NOT DETECTED Final   Entamoeba histolytica NOT DETECTED NOT DETECTED Final   Giardia lamblia NOT DETECTED NOT DETECTED Final   Adenovirus F40/41 NOT DETECTED NOT DETECTED Final   Astrovirus NOT DETECTED NOT DETECTED Final   Norovirus GI/GII NOT DETECTED NOT DETECTED Final   Rotavirus A NOT DETECTED NOT DETECTED Final   Sapovirus (I, II, IV, and V) NOT DETECTED NOT DETECTED Final    Comment: Performed at [facility], [address] [place], [place] [address]  C Difficile Quick Screen w PCR reflex     Status: Abnormal   Collection Time: 01/30/23  4:34 AM   Specimen: Stool  Result Value Ref Range Status   C Diff antigen POSITIVE (A) NEGATIVE Final   C Diff toxin NEGATIVE NEGATIVE Final   C Diff interpretation Results are indeterminate. See PCR results.  Final    Comment: Performed at [facility], [address], [place], [place] [address]  C. Diff by PCR, Reflexed     Status: Abnormal   Collection  Time: 01/30/23  4:34 AM  Result Value Ref Range Status   Toxigenic C. Difficile by PCR POSITIVE (A) NEGATIVE Final    Comment: Positive for toxigenic C. difficile with little to no toxin production. Only treat if clinical presentation suggests symptomatic illness. Performed at [facility], [address], [place], [place] [address]   Culture, blood (Routine X 2) w Reflex to ID Panel     Status: None   Collection Time: 02/12/23 12:57 AM   Specimen: BLOOD LEFT HAND  Result Value Ref Range Status   Specimen Description BLOOD LEFT HAND  Final   Special Requests   Final    BOTTLES DRAWN AEROBIC AND ANAEROBIC Blood Culture adequate volume   Culture   Final    NO GROWTH 5 DAYS Performed at [facility], [address] [place], [place] [address]    Report Status 02/17/2023 FINAL  Final  Culture, blood (Routine X 2) w Reflex to ID Panel     Status: None (Preliminary result)   Collection Time: 02/12/23 12:59 AM   Specimen: BLOOD LEFT ARM  Result Value Ref Range Status   Specimen Description BLOOD LEFT ARM  Final   Special Requests   Final    BOTTLES DRAWN AEROBIC AND ANAEROBIC Blood Culture adequate volume   Culture  Setup Time   Final    YEAST AEROBIC BOTTLE ONLY CRITICAL RESULT CALLED TO, READ BACK BY AND VERIFIED WITH: V BRYK,[id number]  02/16/23 MK    Culture  Final    YEAST Performed at [facility], [address], [place], [place] [address]    Report Status PENDING  Incomplete  Blood Culture ID Panel (Reflexed)     Status: Abnormal   Collection Time: 02/12/23 12:59 AM  Result Value Ref Range Status   Enterococcus faecalis NOT DETECTED NOT DETECTED Final   Enterococcus Faecium NOT DETECTED NOT DETECTED Final   Listeria monocytogenes NOT DETECTED NOT DETECTED Final   Staphylococcus species NOT DETECTED NOT DETECTED Final   Staphylococcus aureus (BCID) NOT DETECTED NOT DETECTED Final   Staphylococcus epidermidis NOT DETECTED NOT DETECTED Final    Staphylococcus lugdunensis NOT DETECTED NOT DETECTED Final   Streptococcus species NOT DETECTED NOT DETECTED Final   Streptococcus agalactiae NOT DETECTED NOT DETECTED Final   Streptococcus pneumoniae NOT DETECTED NOT DETECTED Final   Streptococcus pyogenes NOT DETECTED NOT DETECTED Final   A.calcoaceticus-baumannii NOT DETECTED NOT DETECTED Final   Bacteroides fragilis NOT DETECTED NOT DETECTED Final   Enterobacterales NOT DETECTED NOT DETECTED Final   Enterobacter cloacae complex NOT DETECTED NOT DETECTED Final   Escherichia coli NOT DETECTED NOT DETECTED Final   Klebsiella aerogenes NOT DETECTED NOT DETECTED Final   Klebsiella oxytoca NOT DETECTED NOT DETECTED Final   Klebsiella pneumoniae NOT DETECTED NOT DETECTED Final   Proteus species NOT DETECTED NOT DETECTED Final   Salmonella species NOT DETECTED NOT DETECTED Final   Serratia marcescens NOT DETECTED NOT DETECTED Final   Haemophilus influenzae NOT DETECTED NOT DETECTED Final   Neisseria meningitidis NOT DETECTED NOT DETECTED Final   Pseudomonas aeruginosa NOT DETECTED NOT DETECTED Final   Stenotrophomonas maltophilia NOT DETECTED NOT DETECTED Final   Candida albicans NOT DETECTED NOT DETECTED Final   Candida auris NOT DETECTED NOT DETECTED Final   Candida glabrata DETECTED (A) NOT DETECTED Final    Comment: CRITICAL RESULT CALLED TO, READ BACK BY AND VERIFIED WITH: V BRYK,[id number]  02/16/23 MK    Candida krusei NOT DETECTED NOT DETECTED Final   Candida parapsilosis NOT DETECTED NOT DETECTED Final   Candida tropicalis NOT DETECTED NOT DETECTED Final   Cryptococcus neoformans/gattii NOT DETECTED NOT DETECTED Final    Comment: Performed at [facility], [address], [place], [place] [address]  MRSA Next Gen by PCR, Nasal     Status: None   Collection Time: 02/13/23  8:12 AM   Specimen: Nasal Mucosa; Nasal Swab  Result Value Ref Range Status   MRSA by PCR Next Gen NOT DETECTED NOT DETECTED Final    Comment:  (NOTE) The GeneXpert MRSA Assay (FDA approved for NASAL specimens only), is one component of a comprehensive MRSA colonization surveillance program. It is not intended to diagnose MRSA infection nor to guide or monitor treatment for MRSA infections. Test performance is not FDA approved in patients less than 18 years old. Performed at [facility], [address] [place], [place] [address]     Studies/Results: CT ABDOMEN PELVIS W CONTRAST  Result Date: 02/16/2023 CLINICAL DATA:  Abdominal pain, postop EXAM: CT ABDOMEN AND PELVIS WITH CONTRAST TECHNIQUE: Multidetector CT imaging of the abdomen and pelvis was performed using the standard protocol following bolus administration of intravenous contrast. RADIATION DOSE REDUCTION: This exam was performed according to the departmental dose-optimization program which includes automated exposure control, adjustment of the mA and/or kV according to patient size and/or use of iterative reconstruction technique. CONTRAST:  75mL OMNIPAQUE IOHEXOL 350 MG/ML SOLN COMPARISON:  01/31/2023 FINDINGS: Lower chest: Filling defects seen within  right lower lobe pulmonary arteries compatible with incidental pulmonary emboli. Small left pleural effusion with compressive atelectasis in the left lower lobe. Dependent atelectasis in the right lower lobe. Hepatobiliary: No focal liver abnormality is seen. Status post cholecystectomy. No biliary dilatation. Pancreas: No focal abnormality or ductal dilatation. Spleen: Linear and wedge-shaped hypodensities throughout the spleen most compatible with splenic infarcts. This is similar to prior study. Adrenals/Urinary Tract: No adrenal abnormality. No focal renal abnormality. No stones or hydronephrosis. Urinary bladder is unremarkable. Stomach/Bowel: Interval total colectomy. Feeding tube in place with the tip in the proximal duodenum. Small bowel decompressed. Right lower quadrant ostomy noted. There is edema and fluid  throughout the small bowel mesentery with surgical drains in place. Vascular/Lymphatic: No evidence of aneurysm or adenopathy. Reproductive: Prior hysterectomy.  No adnexal masses. Other: There is edema/fluid throughout the small bowel mesentery, likely postoperative. Few locules of extraluminal gas also noted, also presumably from recent surgery. Surgical drain in place in the mid and left pelvis. Musculoskeletal: No acute bony abnormality. IMPRESSION: Incidental small pulmonary emboli seen in the right lower lobe pulmonary arterial branches. Interval colectomy. Right lower quadrant ostomy noted. No evidence of bowel obstruction. Edema/fluid throughout the mesentery, presumably postoperative. Few locules of free air in the abdomen, also likely postoperative. Small left pleural effusion with compressive atelectasis in the left lower lobe. 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 These are similar to prior study. These results will be called to the ordering clinician or representative by the Radiologist Assistant, and communication documented in the PACS or [employer]. Electronically Signed   By: Charlett Nose M.D.   On: 02/16/2023 19:37   [place] EKG SITE RITE  Result Date: 02/16/2023 If Site Rite image not attached, placement could not be confirmed due to current cardiac rhythm.     Assessment/Plan:  INTERVAL HISTORY:    The abdomen pelvis did not show intra-abdominal abscesses which is fortunate and did show  Interval colectomy. Right lower quadrant ostomy noted  but did show some edema and some free locules of air in the abdomen with a left-sided pleural effusion but also linear wedge-shaped perfusion defects throughout the spleen concerning for splenic infarcts as well as small pulmonary emboli in the right lower lobe pulmonary arterial branches  Patient was started on heparin     Principal Problem:   Cardiac arrest [facility]) Active Problems:    Shock (HCC)   Metabolic encephalopathy   Acute respiratory failure with hypoxia (HCC)   Upper GI bleed   Granulomatous colitis (HCC)   Pseudomembranous colitis   Crohn's disease without complication (HCC)   Ileus (HCC)   C. difficile diarrhea   Sepsis (HCC)   Pressure injury of skin   Hyponatremia   Severe protein-calorie malnutrition (HCC)   ABLA (acute blood loss anemia)    [name] is a 52 y.o. female with history of refractory Crohn's disease who then was admitted with what turned out to be fulminant C. 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  Blood cultures taken that day have now grown Candida blood in 1 of 2 sites sampled.  Vancomycin started for possible C difficile ileitis  #1 Candidemia:  The infarcts in the spleen and the emboli in the lungs make me concerned that the patient could have left and right sided infectious endocarditis and more  worrisome potentially fungal endocarditis.  We are going to get a CT angiogram to better evaluate the lungs for additional pulmonary emboli but she does not have an IV with a sufficient board to facilitate such a study today.  I also do not want to place a midline or PICC until we are confident that her candidemia is clearing.  I do think she needs a transesophageal echocardiogram and have called Trish with Cardiology to arrange  #2 Intrabdominal infection: CT scan is reassuring that there are no residual abscesses in the abdomen there are similar locules of fluid and air but they do not sound like abscesses he does have some purulence apparently still in the wound so I think for now would be reasonable to keep him on antibacterial antibiotics that would like to get rid of them as soon as possible since it would help with his possible C. difficile ileitis   #3 C  difficile ileitis: diarrhea complicated by the fact that the patient has known inflammatory bowel disease is on tube feeds and antibiotics.  Again as above being able to get her off antibacterial antibiotics will be helpful if this is being driven largely by C. Difficile  I have personally spent 56 minutes involved in face-to-face and non-face-to-face activities for this patient on the day of the visit. Professional time spent includes the following activities: Preparing to see the patient (review of tests), Obtaining and/or reviewing separately obtained history (admission/discharge record), Performing a medically appropriate examination and/or evaluation , Ordering medications/tests/procedures, referring and communicating with other health care professionals, Documenting clinical information in the EMR, Independently interpreting results (not separately reported), Communicating results to the patient/family/caregiver, Counseling and educating the patient/family/caregiver and Care coordination (not separately reported).      LOS: 21 days   Acey Lav 02/17/2023, 2:42 PM

## 2023-02-17 NOTE — Progress Notes (Signed)
Pharmacy Antibiotic Note  [name] is a 52 y.o. female admitted on 01/27/2023 with sepsis.  Pharmacy has been consulted for vancomycin IV dosing. Vancomycin levels with subtherapeutic AUC of 322.  Plan: Increase vancomycin to 750mg  IV q8h   Height: 5' 2.99" (160 cm) Weight: 68.8 kg (151 lb 10.8 oz) IBW/kg (Calculated) : 52.38  Temp (24hrs), Avg:97.9 F (36.6 C), Min:97.5 F (36.4 C), Max:98.5 F (36.9 C)  Recent Labs  Lab 02/12/23 0057 02/12/23 0448 02/13/23 0337 02/13/23 0913 02/14/23 0244 02/15/23 0246 02/15/23 1206 02/16/23 0413 02/16/23 1648 02/17/23 0052  WBC 22.8* 26.7* 36.1*  --  35.0* 38.9*  --  40.8*  --   --   CREATININE 0.68 0.69 0.62  --  0.35* 0.48 0.55 0.48  --   --   LATICACIDVEN 1.9  --   --  1.2  --   --   --   --   --   --   VANCOTROUGH  --   --   --   --   --   --   --   --   --  8*  VANCOPEAK  --   --   --   --   --   --   --   --  19*  --     Estimated Creatinine Clearance: 75.7 mL/min (by C-G formula based on SCr of 0.48 mg/dL).    Allergies  Allergen Reactions   Codeine Shortness Of Breath, Swelling and Rash    Throat swelling   Dilaudid [Hydromorphone Hcl] Shortness Of Breath and Swelling   Hydrocodone Shortness Of Breath, Swelling and Rash   Ketorolac Nausea And Vomiting and Swelling    Pt reports n/v and swelling to throat   Morphine And Codeine Shortness Of Breath, Swelling and Rash   Oxycodone Shortness Of Breath, Swelling and Rash    Patient states ALL pain medications make her deathly sick.   Penicillins Shortness Of Breath, Swelling and Other (See Comments)    Throat swells 02/06/21--TOLERATES CEFTRIAXONE     Nickel Rash   Pregabalin Other (See Comments)   Acetaminophen     Per MD patient states she can't take Tylenol because of liver enzymes   Ativan [Lorazepam] Other (See Comments)    Migraines.   Strawberry Extract Swelling   Watermelon Concentrate Swelling   Albuterol Palpitations   Benadryl [Diphenhydramine Hcl]  Palpitations   Humira [Adalimumab] Rash   Latex Rash   Remicade [Infliximab] Rash    Blisters and Welts    Tramadol Rash    Rash and itching    Antimicrobials this admission: PO vanco 10/2 >> 10/12 Rocephin 9/26 >>10/6; 10/11 >  Flagyl 9/26 >>10/6; 10/10 >  Vancomycin IV 10/14 >   Dose adjustments this admission:  Microbiology results: 9/26 BCx - GPCs 1/4, BCID: staph spp.  9/26 MRSA PCR - negative 9/29 Cdiff PCR: positive, Cdiff toxin negative 9/30 CT ab: ? abscess, peritonitis   10/12 BCx x 2 > ngtd   Fredonia Highland, PharmD, BCPS, [facility] Clinical Pharmacist Please check AMION for all [facility] Pharmacy numbers 02/17/2023

## 2023-02-17 NOTE — Progress Notes (Signed)
OT Cancellation Note  Patient Details Name: Hartlee Amedee MRN: [id number] DOB: Jun 09, 1970   Cancelled Treatment:    Reason Eval/Treat Not Completed: Patient at procedure or test/ unavailable. Getting PICC line. OT will continue to follow as schedule allows.  Evern Bio Teigan Manner 02/17/2023, 10:38 PM  Nyoka Cowden OTR/L Acute Rehabilitation Services Office: [phone]

## 2023-02-17 NOTE — Anesthesia Postprocedure Evaluation (Signed)
Anesthesia Post Note  Patient: Santa Quang  Procedure(s) Performed: EXPLORATORY LAPAROTOMY TOTAL COLECTOMY COLOSTOMY     Patient location during evaluation: PACU Anesthesia Type: General Level of consciousness: awake and patient cooperative Pain management: pain level controlled Vital Signs Assessment: post-procedure vital signs reviewed and stable Respiratory status: spontaneous breathing, nonlabored ventilation, respiratory function stable and patient connected to nasal cannula oxygen Cardiovascular status: blood pressure returned to baseline and stable Postop Assessment: no apparent nausea or vomiting Anesthetic complications: no   No notable events documented.              Anaika Santillano

## 2023-02-17 NOTE — Progress Notes (Signed)
PROGRESS NOTE    [name]  WUJ:[id number] DOB: [birth year] DOA: 01/27/2023 PCP: [name], PA-C   Brief Narrative:  This is a 52 year old female with past medical history of COPD, Crohn's on Skyrizi, hypertension, type 2 diabetes mellitus who initially presented to the after being found unresponsive. Per family, patient has had bloody emesis with diarrhea over the past few days. Initial systolics in the 60s in the ED, and patient was minimally responsive. In the emergency room, patient was intubated, and patient was admitted to the ICU for further evaluation and management for shock and altered mental status.    01/27/2023: Admitted to ICU and intubated.  Felt to have septic shock of unclear source which resolved antibiotic therapy and fluid resuscitation.  Suspicion was that she may have had C. difficile colitis despite negative toxin.  She was treated with vancomycin. 9/29 Off pressors 10/2: Extubated 10/3 transfer to floor. 10/10 underwent repeat proctosigmoidoscopy for ongoing diarrhea.  Diffuse severe inflammation identified due to combination of Crohn's disease, active C. difficile infection and possible ischemic colitis from prior hypotension on presentation. 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  Intraoperative findings include frank necrosis with pelvic abscess formation and significant colitis. 10/12 some dilated small bowel with minimal OG output.  Continued observation advised.  Having significant postoperative pain. 10/13 became tachycardic and hypotensive.  More lethargic.  Transferred back to ICU. 10/15 blood cultures reports fungemia, resume PO vancomycin 10/17 CT abdomen pelvis notable for septic emboli   Assessment & Plan:   Principal Problem:   Cardiac arrest [facility]) Active Problems:   Metabolic encephalopathy   Shock (HCC)   Acute respiratory failure with hypoxia (HCC)   Upper GI bleed   Granulomatous colitis (HCC)   Pseudomembranous  colitis   Crohn's disease without complication (HCC)   Ileus (HCC)   C. difficile diarrhea   Sepsis (HCC)   Pressure injury of skin   Hyponatremia   Severe protein-calorie malnutrition (HCC)   ABLA (acute blood loss anemia)   Septic shock, multifactorial, POA -Cultures not reporting fungemia, fulminant C. difficile colitis and intra-abdominal abscess also notable sources for infection -ID following, appreciate insight recommendations-Continue micafungin, vancomycin, ceftriaxone, Flagyl -Status post colectomy -CT abdomen pelvis shows splenic and pulmonary emboli -CT chest on hold due to poor IV access -Possible need for TEE in the near future to rule out endocarditis   Infectious colitis due to C. difficile on treatment -See above -High output ostomy, continue to supplement p.o. and IV hydration as appropriate  Longstanding Crohn's disease; not chronically on steroids so less concern for adrenal insufficiency -Hold steroids, Skyrizi   Type 2 diabetes -SSI PRN, has not needed much insulin currently -Goal BG 140-180   History of seizures -Continue PTA keppra -Resume PTA gabapentin   History of anxiety -Continue ativan 2mg  PRN -likely can soon resume PTA anxiety meds once PO intake improves (Cymbalta, atarax)   History of hypertension -Hold home antihypertensives given infection/sepsis and prior hypotension. Resume as appropriate.   History of COPD -con't xopenex PRN -switch PTA spiriva to yupelri for now   GERD -Continue PTA PPI   Anemia  -Transfuse for Hb <7 or hemodynamically significant bleeding   Hypokalemia/hypomagnesemia -repleted, monitor  At high risk for malnutrition. -Currently n.p.o. for repeat imaging per surgery, advance diet per their expertise -adding thiamine, folic acid given chronic risk for malabsorption with crohn's and C diff.  -hopefully can avoid TPN  Cardiac arrest secondary to septic shock requiring CPR on admission Echocardiogram on  admit within normal limits  Crohn's disease - well controlled on Skyrizi Unclear if this plays a role in her overall condition Crohn's colitis/ischemic colitis/C. difficile all playing a role and not a candidate at this time for [facility] or any other immunosuppressants   Pericardial effusion without tamponade Will need 6 to 8-week follow-up with Dr. Diona Browner in the outpatient setting for repeat echo   Seizure disorder Continue Keppra  Anxiety disorder-continue Ativan as needed   Non-insulin-dependent diabetes type 2, well-controlled  -A1c 5.8 -advance diet as tolerated, continue hypoglycemic protocol and sliding scale insulin   DVT prophylaxis: SCDs Start: 01/27/23 1111 he parin gtt Code Status:   Code Status: Full Code Family Communication: None present  Status is: Inpatient  Dispo: The patient is from: Home              Anticipated d/c is to: TBD              Anticipated d/c date is: To be determined              Patient currently not medically stable for discharge  Consultants:  PCCM, ID, general surgery  Procedures:  Colectomy with ostomy placement  Antimicrobials:  Ceftriaxone, Flagyl, micafungin, PO vancomycin  Subjective: No acute issues or events overnight denies nausea vomiting diarrhea constipation headache fevers chills or chest pain.  Objective: Vitals:   02/17/23 0251 02/17/23 0631 02/17/23 0828 02/17/23 1235  BP: 132/67  137/70 (!) 140/74  Pulse: (!) 126  (!) 127 (!) 126  Resp: 20  20 20   Temp: 98.7 F (37.1 C) 98.2 F (36.8 C) 97.6 F (36.4 C) (!) 97.4 F (36.3 C)  TempSrc: Oral Oral Oral Oral  SpO2: 94%  94% 93%  Weight:      Height:        Intake/Output Summary (Last 24 hours) at 02/17/2023 1610 Last data filed at 02/17/2023 1558 Gross per 24 hour  Intake 2709.66 ml  Output 5935 ml  Net -3225.34 ml   Filed Weights   02/14/23 0400 02/15/23 0616 02/16/23 0107  Weight: 65.4 kg 64 kg 68.8 kg    Examination:  General exam: Appears calm  and comfortable  Respiratory system: Clear to auscultation. Respiratory effort normal. Cardiovascular system: S1 & S2 heard, RRR. No JVD, murmurs, rubs, gallops or clicks. No pedal edema. Gastrointestinal system: midline incision/wound vac appear stable/no leaks; ostomy clean/dry putting out liquid brown stool Central nervous system: Alert and oriented. No focal neurological deficits. Extremities: Symmetric 5 x 5 power. Skin: No rashes, lesions or ulcers Psychiatry: Judgement and insight appear normal. Mood & affect appropriate.   Data Reviewed: I have personally reviewed following labs and imaging studies  CBC: Recent Labs  Lab 02/13/23 0337 02/14/23 0244 02/15/23 0246 02/16/23 0413 02/17/23 0321  WBC 36.1* 35.0* 38.9* 40.8* 44.4*  HGB 10.6* 10.1* 10.1* 9.4* 9.3*  HCT 31.7* 31.5* 30.4* 28.6* 29.4*  MCV 85.4 85.6 84.0 86.1 87.0  PLT 670* 611* 618* 609* 672*   Basic Metabolic Panel: Recent Labs  Lab 02/11/23 0427 02/11/23 1555 02/14/23 0244 02/15/23 0246 02/15/23 1206 02/16/23 0413 02/17/23 0321  NA 134*   < > 138 135 133* 138 136  K 5.6*   < > 3.1* 3.3* 4.0 3.8 3.3*  CL 100   < > 108 101 103 101 97*  CO2 22   < > 18* 23 22 25 24   GLUCOSE 100*   < > 90 115* 153* 109* 134*  BUN 8   < > <  5* <5* <5* <5* 5*  CREATININE 0.71   < > 0.35* 0.48 0.55 0.48 0.37*  CALCIUM 8.8*   < > 8.0* 8.5* 8.1* 8.9 9.0  MG 2.0  --   --  1.6*  --  2.0 1.6*  PHOS 4.6  --   --   --   --   --   --    < > = values in this interval not displayed.   GFR: Estimated Creatinine Clearance: 75.7 mL/min (A) (by C-G formula based on SCr of 0.37 mg/dL (L)).  Liver Function Tests: Recent Labs  Lab 02/13/23 0337 02/14/23 0244 02/15/23 0246 02/16/23 0413 02/17/23 0321  AST 18 14* 13* 14* 14*  ALT 13 10 10 8 9   ALKPHOS 45 60 83 135* 132*  BILITOT 0.6 0.3 0.4 0.5 0.4  PROT 4.9* 4.9* 5.1* 5.3* 5.3*  ALBUMIN 2.4* 2.6* 2.4* 2.9* 2.6*   CBG: Recent Labs  Lab 02/16/23 2056 02/16/23 2330  02/17/23 0316 02/17/23 0823 02/17/23 1230  GLUCAP 129* 132* 143* 135* 131*   Sepsis Labs: Recent Labs  Lab 02/12/23 0057 02/13/23 0913  LATICACIDVEN 1.9 1.2    Recent Results (from the past 240 hour(s))  Culture, blood (Routine X 2) w Reflex to ID Panel     Status: None   Collection Time: 02/12/23 12:57 AM   Specimen: BLOOD LEFT HAND  Result Value Ref Range Status   Specimen Description BLOOD LEFT HAND  Final   Special Requests   Final    BOTTLES DRAWN AEROBIC AND ANAEROBIC Blood Culture adequate volume   Culture   Final    NO GROWTH 5 DAYS Performed at [facility], [address], [place], [place] [address]    Report Status 02/17/2023 FINAL  Final  Culture, blood (Routine X 2) w Reflex to ID Panel     Status: None (Preliminary result)   Collection Time: 02/12/23 12:59 AM   Specimen: BLOOD LEFT ARM  Result Value Ref Range Status   Specimen Description BLOOD LEFT ARM  Final   Special Requests   Final    BOTTLES DRAWN AEROBIC AND ANAEROBIC Blood Culture adequate volume   Culture  Setup Time   Final    YEAST AEROBIC BOTTLE ONLY CRITICAL RESULT CALLED TO, READ BACK BY AND VERIFIED WITH: V [email or web address],[id number]  02/16/23 MK    Culture   Final    YEAST Performed at [facility], [address], [place], [place] [address]    Report Status PENDING  Incomplete  Blood Culture ID Panel (Reflexed)     Status: Abnormal   Collection Time: 02/12/23 12:59 AM  Result Value Ref Range Status   Enterococcus faecalis NOT DETECTED NOT DETECTED Final   Enterococcus Faecium NOT DETECTED NOT DETECTED Final   Listeria monocytogenes NOT DETECTED NOT DETECTED Final   Staphylococcus species NOT DETECTED NOT DETECTED Final   Staphylococcus aureus (BCID) NOT DETECTED NOT DETECTED Final   Staphylococcus epidermidis NOT DETECTED NOT DETECTED Final   Staphylococcus lugdunensis NOT DETECTED NOT DETECTED Final   Streptococcus species NOT DETECTED NOT DETECTED Final   Streptococcus  agalactiae NOT DETECTED NOT DETECTED Final   Streptococcus pneumoniae NOT DETECTED NOT DETECTED Final   Streptococcus pyogenes NOT DETECTED NOT DETECTED Final   A.calcoaceticus-baumannii NOT DETECTED NOT DETECTED Final   Bacteroides fragilis NOT DETECTED NOT DETECTED Final   Enterobacterales NOT DETECTED NOT DETECTED Final   Enterobacter cloacae complex NOT DETECTED NOT DETECTED Final   Escherichia coli NOT DETECTED  NOT DETECTED Final   Klebsiella aerogenes NOT DETECTED NOT DETECTED Final   Klebsiella oxytoca NOT DETECTED NOT DETECTED Final   Klebsiella pneumoniae NOT DETECTED NOT DETECTED Final   Proteus species NOT DETECTED NOT DETECTED Final   Salmonella species NOT DETECTED NOT DETECTED Final   Serratia marcescens NOT DETECTED NOT DETECTED Final   Haemophilus influenzae NOT DETECTED NOT DETECTED Final   Neisseria meningitidis NOT DETECTED NOT DETECTED Final   Pseudomonas aeruginosa NOT DETECTED NOT DETECTED Final   Stenotrophomonas maltophilia NOT DETECTED NOT DETECTED Final   Candida albicans NOT DETECTED NOT DETECTED Final   Candida auris NOT DETECTED NOT DETECTED Final   Candida glabrata DETECTED (A) NOT DETECTED Final    Comment: CRITICAL RESULT CALLED TO, READ BACK BY AND VERIFIED WITH: V [id number]  02/16/23 MK    Candida krusei NOT DETECTED NOT DETECTED Final   Candida parapsilosis NOT DETECTED NOT DETECTED Final   Candida tropicalis NOT DETECTED NOT DETECTED Final   Cryptococcus neoformans/gattii NOT DETECTED NOT DETECTED Final    Comment: Performed at [facility], [address], [place], [place] [address]  MRSA Next Gen by PCR, Nasal     Status: None   Collection Time: 02/13/23  8:12 AM   Specimen: Nasal Mucosa; Nasal Swab  Result Value Ref Range Status   MRSA by PCR Next Gen NOT DETECTED NOT DETECTED Final    Comment: (NOTE) The GeneXpert MRSA Assay (FDA approved for NASAL specimens only), is one component of a comprehensive MRSA colonization  surveillance program. It is not intended to diagnose MRSA infection nor to guide or monitor treatment for MRSA infections. Test performance is not FDA approved in patients less than 81 years old. Performed at [facility], [address] [place], [place] [address]          Radiology Studies: CT ABDOMEN PELVIS W CONTRAST  Result Date: 02/16/2023 CLINICAL DATA:  Abdominal pain, postop EXAM: CT ABDOMEN AND PELVIS WITH CONTRAST TECHNIQUE: Multidetector CT imaging of the abdomen and pelvis was performed using the standard protocol following bolus administration of intravenous contrast. RADIATION DOSE REDUCTION: This exam was performed according to the departmental dose-optimization program which includes automated exposure control, adjustment of the mA and/or kV according to patient size and/or use of iterative reconstruction technique. CONTRAST:  75mL OMNIPAQUE IOHEXOL 350 MG/ML SOLN COMPARISON:  01/31/2023 FINDINGS: Lower chest: Filling defects seen within right lower lobe pulmonary arteries compatible with incidental pulmonary emboli. Small left pleural effusion with compressive atelectasis in the left lower lobe. Dependent atelectasis in the right lower lobe. Hepatobiliary: No focal liver abnormality is seen. Status post cholecystectomy. No biliary dilatation. Pancreas: No focal abnormality or ductal dilatation. Spleen: Linear and wedge-shaped hypodensities throughout the spleen most compatible with splenic infarcts. This is similar to prior study. Adrenals/Urinary Tract: No adrenal abnormality. No focal renal abnormality. No stones or hydronephrosis. Urinary bladder is unremarkable. Stomach/Bowel: Interval total colectomy. Feeding tube in place with the tip in the proximal duodenum. Small bowel decompressed. Right lower quadrant ostomy noted. There is edema and fluid throughout the small bowel mesentery with surgical drains in place. Vascular/Lymphatic: No evidence of aneurysm or adenopathy.  Reproductive: Prior hysterectomy.  No adnexal masses. Other: There is edema/fluid throughout the small bowel mesentery, likely postoperative. Few locules of extraluminal gas also noted, also presumably from recent surgery. Surgical drain in place in the mid and left pelvis. Musculoskeletal: No acute bony abnormality. IMPRESSION: Incidental small pulmonary emboli seen in the right lower lobe  pulmonary arterial branches. Interval colectomy. Right lower quadrant ostomy noted. No evidence of bowel obstruction. Edema/fluid throughout the mesentery, presumably postoperative. Few locules of free air in the abdomen, also likely postoperative. Small left pleural effusion with compressive atelectasis in the left lower lobe. 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 These are similar to prior study. These results will be called to the ordering clinician or representative by the Radiologist Assistant, and communication documented in the PACS or [employer]. Electronically Signed   By: Charlett Nose M.D.   On: 02/16/2023 19:37   [place] EKG SITE RITE  Result Date: 02/16/2023 If Site Rite image not attached, placement could not be confirmed due to current cardiac rhythm.       Scheduled Meds:  acetaminophen (TYLENOL) oral liquid 160 mg/5 mL  1,000 mg Per Tube Q6H   amitriptyline  25 mg Oral QHS   Chlorhexidine Gluconate Cloth  6 each Topical Daily   DULoxetine  30 mg Oral Daily   fentaNYL  1 patch Transdermal Q72H   ferrous sulfate  325 mg Oral BID WC   fiber supplement (BANATROL TF)  60 mL Per Tube QID   folic acid  1 mg Intravenous Daily   gabapentin  100 mg Oral Q8H   Gerhardt's butt cream   Topical BID   insulin aspart  0-9 Units Subcutaneous Q4H   lidocaine  2 patch Transdermal Q24H   loperamide  2 mg Oral QID   methocarbamol  1,000 mg Per Tube TID   multivitamin with minerals  1 tablet Oral Daily   mouth rinse  15 mL Mouth Rinse 4 times per day    pantoprazole (PROTONIX) IV  40 mg Intravenous Q24H   potassium chloride  40 mEq Oral BID   revefenacin  175 mcg Nebulization Daily   thiamine (VITAMIN B1) injection  100 mg Intravenous Daily   vancomycin  125 mg Per Tube QID   Vivonex RTF  1,000 mL Oral Q24H   Continuous Infusions:  cefTRIAXone (ROCEPHIN)  IV Stopped (02/16/23 2304)   heparin 1,600 Units/hr (02/17/23 1558)   levETIRAcetam Stopped (02/17/23 0914)   metronidazole 10 mL/hr at 02/17/23 1558   [START ON 02/18/2023] micafungin (MYCAMINE) 150 mg in sodium chloride 0.9 % 100 mL IVPB       LOS: 21 days   Time spent:  Azucena Fallen, DO [facility]  If 7PM-7AM, please contact night-coverage [email or web address]  02/17/2023, 4:10 PM

## 2023-02-17 NOTE — Consult Note (Addendum)
  WOC Nurse wound consult note Pt had ileostomy surgery on 10/11.  Stoma type/location: Stoma is red and viable, slightly above skin level, 1 1/2 inches.  80cc liquid green stool in the pouch.  Peristomal assessment: intact Ostomy pouching: Applied barrier ring and one piece convex pouch.   Education provided: Pt did not watch the procedure or participate in the pouch change demonstration.  No family members present.  EMR indicates she will be discharging to a SNF; she will require total assistance with ostomy care at this point until she is ready to participate in learning the routines for emptying and pouch changes.  2 sets of supplies are at the bedside: Use Supplies: barrier ring, Lawson # [id number] and convex pouch Lawson # [id number].  Educational materials are at the bedside.  Enrolled patient in [employer] DC program: yes, today.  WOC Nurse ostomy consult Note: Reason for Consult: Changed abd Vac dressing to full thickness post-op midline wound.  Surgical team following for assessment and plan of care.  Pt was medicated for pain prior to the procedure and tolerated with mod amt discomfort. Applied one piece black foam to cont suction.  Wound is beefy red, 21X2X4cm, mod amt pink drainage in the cannister, which was changed.   WOC team will change again on Monday.  Supplies at the bedside.  Thank-you,  Cammie Mcgee MSN, RN, CWOCN, [place], CNS [phone]

## 2023-02-17 NOTE — H&P (View-Only) (Signed)
PROGRESS NOTE    [name]  WUJ:[id number] DOB: [birth year] DOA: 01/27/2023 PCP: [name], PA-C   Brief Narrative:  This is a 52 year old female with past medical history of COPD, Crohn's on Skyrizi, hypertension, type 2 diabetes mellitus who initially presented to the after being found unresponsive. Per family, patient has had bloody emesis with diarrhea over the past few days. Initial systolics in the 60s in the ED, and patient was minimally responsive. In the emergency room, patient was intubated, and patient was admitted to the ICU for further evaluation and management for shock and altered mental status.    01/27/2023: Admitted to ICU and intubated.  Felt to have septic shock of unclear source which resolved antibiotic therapy and fluid resuscitation.  Suspicion was that she may have had C. difficile colitis despite negative toxin.  She was treated with vancomycin. 9/29 Off pressors 10/2: Extubated 10/3 transfer to floor. 10/10 underwent repeat proctosigmoidoscopy for ongoing diarrhea.  Diffuse severe inflammation identified due to combination of Crohn's disease, active C. difficile infection and possible ischemic colitis from prior hypotension on presentation. 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  Intraoperative findings include frank necrosis with pelvic abscess formation and significant colitis. 10/12 some dilated small bowel with minimal OG output.  Continued observation advised.  Having significant postoperative pain. 10/13 became tachycardic and hypotensive.  More lethargic.  Transferred back to ICU. 10/15 blood cultures reports fungemia, resume PO vancomycin 10/17 CT abdomen pelvis notable for septic emboli   Assessment & Plan:   Principal Problem:   Cardiac arrest [facility]) Active Problems:   Metabolic encephalopathy   Shock (HCC)   Acute respiratory failure with hypoxia (HCC)   Upper GI bleed   Granulomatous colitis (HCC)   Pseudomembranous  colitis   Crohn's disease without complication (HCC)   Ileus (HCC)   C. difficile diarrhea   Sepsis (HCC)   Pressure injury of skin   Hyponatremia   Severe protein-calorie malnutrition (HCC)   ABLA (acute blood loss anemia)   Septic shock, multifactorial, POA -Cultures not reporting fungemia, fulminant C. difficile colitis and intra-abdominal abscess also notable sources for infection -ID following, appreciate insight recommendations-Continue micafungin, vancomycin, ceftriaxone, Flagyl -Status post colectomy -CT abdomen pelvis shows splenic and pulmonary emboli -CT chest on hold due to poor IV access -Possible need for TEE in the near future to rule out endocarditis   Infectious colitis due to C. difficile on treatment -See above -High output ostomy, continue to supplement p.o. and IV hydration as appropriate  Longstanding Crohn's disease; not chronically on steroids so less concern for adrenal insufficiency -Hold steroids, Skyrizi   Type 2 diabetes -SSI PRN, has not needed much insulin currently -Goal BG 140-180   History of seizures -Continue PTA keppra -Resume PTA gabapentin   History of anxiety -Continue ativan 2mg  PRN -likely can soon resume PTA anxiety meds once PO intake improves (Cymbalta, atarax)   History of hypertension -Hold home antihypertensives given infection/sepsis and prior hypotension. Resume as appropriate.   History of COPD -con't xopenex PRN -switch PTA spiriva to yupelri for now   GERD -Continue PTA PPI   Anemia  -Transfuse for Hb <7 or hemodynamically significant bleeding   Hypokalemia/hypomagnesemia -repleted, monitor  At high risk for malnutrition. -Currently n.p.o. for repeat imaging per surgery, advance diet per their expertise -adding thiamine, folic acid given chronic risk for malabsorption with crohn's and C diff.  -hopefully can avoid TPN  Cardiac arrest secondary to septic shock requiring CPR on admission Echocardiogram on  admit within normal limits  Crohn's disease - well controlled on Skyrizi Unclear if this plays a role in her overall condition Crohn's colitis/ischemic colitis/C. difficile all playing a role and not a candidate at this time for [facility] or any other immunosuppressants   Pericardial effusion without tamponade Will need 6 to 8-week follow-up with Dr. Diona Browner in the outpatient setting for repeat echo   Seizure disorder Continue Keppra  Anxiety disorder-continue Ativan as needed   Non-insulin-dependent diabetes type 2, well-controlled  -A1c 5.8 -advance diet as tolerated, continue hypoglycemic protocol and sliding scale insulin   DVT prophylaxis: SCDs Start: 01/27/23 1111 he parin gtt Code Status:   Code Status: Full Code Family Communication: None present  Status is: Inpatient  Dispo: The patient is from: Home              Anticipated d/c is to: TBD              Anticipated d/c date is: To be determined              Patient currently not medically stable for discharge  Consultants:  PCCM, ID, general surgery  Procedures:  Colectomy with ostomy placement  Antimicrobials:  Ceftriaxone, Flagyl, micafungin, PO vancomycin  Subjective: No acute issues or events overnight denies nausea vomiting diarrhea constipation headache fevers chills or chest pain.  Objective: Vitals:   02/17/23 0251 02/17/23 0631 02/17/23 0828 02/17/23 1235  BP: 132/67  137/70 (!) 140/74  Pulse: (!) 126  (!) 127 (!) 126  Resp: 20  20 20   Temp: 98.7 F (37.1 C) 98.2 F (36.8 C) 97.6 F (36.4 C) (!) 97.4 F (36.3 C)  TempSrc: Oral Oral Oral Oral  SpO2: 94%  94% 93%  Weight:      Height:        Intake/Output Summary (Last 24 hours) at 02/17/2023 1610 Last data filed at 02/17/2023 1558 Gross per 24 hour  Intake 2709.66 ml  Output 5935 ml  Net -3225.34 ml   Filed Weights   02/14/23 0400 02/15/23 0616 02/16/23 0107  Weight: 65.4 kg 64 kg 68.8 kg    Examination:  General exam: Appears calm  and comfortable  Respiratory system: Clear to auscultation. Respiratory effort normal. Cardiovascular system: S1 & S2 heard, RRR. No JVD, murmurs, rubs, gallops or clicks. No pedal edema. Gastrointestinal system: midline incision/wound vac appear stable/no leaks; ostomy clean/dry putting out liquid brown stool Central nervous system: Alert and oriented. No focal neurological deficits. Extremities: Symmetric 5 x 5 power. Skin: No rashes, lesions or ulcers Psychiatry: Judgement and insight appear normal. Mood & affect appropriate.   Data Reviewed: I have personally reviewed following labs and imaging studies  CBC: Recent Labs  Lab 02/13/23 0337 02/14/23 0244 02/15/23 0246 02/16/23 0413 02/17/23 0321  WBC 36.1* 35.0* 38.9* 40.8* 44.4*  HGB 10.6* 10.1* 10.1* 9.4* 9.3*  HCT 31.7* 31.5* 30.4* 28.6* 29.4*  MCV 85.4 85.6 84.0 86.1 87.0  PLT 670* 611* 618* 609* 672*   Basic Metabolic Panel: Recent Labs  Lab 02/11/23 0427 02/11/23 1555 02/14/23 0244 02/15/23 0246 02/15/23 1206 02/16/23 0413 02/17/23 0321  NA 134*   < > 138 135 133* 138 136  K 5.6*   < > 3.1* 3.3* 4.0 3.8 3.3*  CL 100   < > 108 101 103 101 97*  CO2 22   < > 18* 23 22 25 24   GLUCOSE 100*   < > 90 115* 153* 109* 134*  BUN 8   < > <  5* <5* <5* <5* 5*  CREATININE 0.71   < > 0.35* 0.48 0.55 0.48 0.37*  CALCIUM 8.8*   < > 8.0* 8.5* 8.1* 8.9 9.0  MG 2.0  --   --  1.6*  --  2.0 1.6*  PHOS 4.6  --   --   --   --   --   --    < > = values in this interval not displayed.   GFR: Estimated Creatinine Clearance: 75.7 mL/min (A) (by C-G formula based on SCr of 0.37 mg/dL (L)).  Liver Function Tests: Recent Labs  Lab 02/13/23 0337 02/14/23 0244 02/15/23 0246 02/16/23 0413 02/17/23 0321  AST 18 14* 13* 14* 14*  ALT 13 10 10 8 9   ALKPHOS 45 60 83 135* 132*  BILITOT 0.6 0.3 0.4 0.5 0.4  PROT 4.9* 4.9* 5.1* 5.3* 5.3*  ALBUMIN 2.4* 2.6* 2.4* 2.9* 2.6*   CBG: Recent Labs  Lab 02/16/23 2056 02/16/23 2330  02/17/23 0316 02/17/23 0823 02/17/23 1230  GLUCAP 129* 132* 143* 135* 131*   Sepsis Labs: Recent Labs  Lab 02/12/23 0057 02/13/23 0913  LATICACIDVEN 1.9 1.2    Recent Results (from the past 240 hour(s))  Culture, blood (Routine X 2) w Reflex to ID Panel     Status: None   Collection Time: 02/12/23 12:57 AM   Specimen: BLOOD LEFT HAND  Result Value Ref Range Status   Specimen Description BLOOD LEFT HAND  Final   Special Requests   Final    BOTTLES DRAWN AEROBIC AND ANAEROBIC Blood Culture adequate volume   Culture   Final    NO GROWTH 5 DAYS Performed at [facility], [address], [place], [place] [address]    Report Status 02/17/2023 FINAL  Final  Culture, blood (Routine X 2) w Reflex to ID Panel     Status: None (Preliminary result)   Collection Time: 02/12/23 12:59 AM   Specimen: BLOOD LEFT ARM  Result Value Ref Range Status   Specimen Description BLOOD LEFT ARM  Final   Special Requests   Final    BOTTLES DRAWN AEROBIC AND ANAEROBIC Blood Culture adequate volume   Culture  Setup Time   Final    YEAST AEROBIC BOTTLE ONLY CRITICAL RESULT CALLED TO, READ BACK BY AND VERIFIED WITH: V BRYK,[id number]  02/16/23 MK    Culture   Final    YEAST Performed at [facility], [address], [place], [place] [address]    Report Status PENDING  Incomplete  Blood Culture ID Panel (Reflexed)     Status: Abnormal   Collection Time: 02/12/23 12:59 AM  Result Value Ref Range Status   Enterococcus faecalis NOT DETECTED NOT DETECTED Final   Enterococcus Faecium NOT DETECTED NOT DETECTED Final   Listeria monocytogenes NOT DETECTED NOT DETECTED Final   Staphylococcus species NOT DETECTED NOT DETECTED Final   Staphylococcus aureus (BCID) NOT DETECTED NOT DETECTED Final   Staphylococcus epidermidis NOT DETECTED NOT DETECTED Final   Staphylococcus lugdunensis NOT DETECTED NOT DETECTED Final   Streptococcus species NOT DETECTED NOT DETECTED Final   Streptococcus  agalactiae NOT DETECTED NOT DETECTED Final   Streptococcus pneumoniae NOT DETECTED NOT DETECTED Final   Streptococcus pyogenes NOT DETECTED NOT DETECTED Final   A.calcoaceticus-baumannii NOT DETECTED NOT DETECTED Final   Bacteroides fragilis NOT DETECTED NOT DETECTED Final   Enterobacterales NOT DETECTED NOT DETECTED Final   Enterobacter cloacae complex NOT DETECTED NOT DETECTED Final   Escherichia coli NOT DETECTED  NOT DETECTED Final   Klebsiella aerogenes NOT DETECTED NOT DETECTED Final   Klebsiella oxytoca NOT DETECTED NOT DETECTED Final   Klebsiella pneumoniae NOT DETECTED NOT DETECTED Final   Proteus species NOT DETECTED NOT DETECTED Final   Salmonella species NOT DETECTED NOT DETECTED Final   Serratia marcescens NOT DETECTED NOT DETECTED Final   Haemophilus influenzae NOT DETECTED NOT DETECTED Final   Neisseria meningitidis NOT DETECTED NOT DETECTED Final   Pseudomonas aeruginosa NOT DETECTED NOT DETECTED Final   Stenotrophomonas maltophilia NOT DETECTED NOT DETECTED Final   Candida albicans NOT DETECTED NOT DETECTED Final   Candida auris NOT DETECTED NOT DETECTED Final   Candida glabrata DETECTED (A) NOT DETECTED Final    Comment: CRITICAL RESULT CALLED TO, READ BACK BY AND VERIFIED WITH: V BRYK,[email or web address]  02/16/23 MK    Candida krusei NOT DETECTED NOT DETECTED Final   Candida parapsilosis NOT DETECTED NOT DETECTED Final   Candida tropicalis NOT DETECTED NOT DETECTED Final   Cryptococcus neoformans/gattii NOT DETECTED NOT DETECTED Final    Comment: Performed at [facility], [address], [place], [place] [address]  MRSA Next Gen by PCR, Nasal     Status: None   Collection Time: 02/13/23  8:12 AM   Specimen: Nasal Mucosa; Nasal Swab  Result Value Ref Range Status   MRSA by PCR Next Gen NOT DETECTED NOT DETECTED Final    Comment: (NOTE) The GeneXpert MRSA Assay (FDA approved for NASAL specimens only), is one component of a comprehensive MRSA colonization  surveillance program. It is not intended to diagnose MRSA infection nor to guide or monitor treatment for MRSA infections. Test performance is not FDA approved in patients less than 81 years old. Performed at [facility], [address] [place], [place] [address]          Radiology Studies: CT ABDOMEN PELVIS W CONTRAST  Result Date: 02/16/2023 CLINICAL DATA:  Abdominal pain, postop EXAM: CT ABDOMEN AND PELVIS WITH CONTRAST TECHNIQUE: Multidetector CT imaging of the abdomen and pelvis was performed using the standard protocol following bolus administration of intravenous contrast. RADIATION DOSE REDUCTION: This exam was performed according to the departmental dose-optimization program which includes automated exposure control, adjustment of the mA and/or kV according to patient size and/or use of iterative reconstruction technique. CONTRAST:  75mL OMNIPAQUE IOHEXOL 350 MG/ML SOLN COMPARISON:  01/31/2023 FINDINGS: Lower chest: Filling defects seen within right lower lobe pulmonary arteries compatible with incidental pulmonary emboli. Small left pleural effusion with compressive atelectasis in the left lower lobe. Dependent atelectasis in the right lower lobe. Hepatobiliary: No focal liver abnormality is seen. Status post cholecystectomy. No biliary dilatation. Pancreas: No focal abnormality or ductal dilatation. Spleen: Linear and wedge-shaped hypodensities throughout the spleen most compatible with splenic infarcts. This is similar to prior study. Adrenals/Urinary Tract: No adrenal abnormality. No focal renal abnormality. No stones or hydronephrosis. Urinary bladder is unremarkable. Stomach/Bowel: Interval total colectomy. Feeding tube in place with the tip in the proximal duodenum. Small bowel decompressed. Right lower quadrant ostomy noted. There is edema and fluid throughout the small bowel mesentery with surgical drains in place. Vascular/Lymphatic: No evidence of aneurysm or adenopathy.  Reproductive: Prior hysterectomy.  No adnexal masses. Other: There is edema/fluid throughout the small bowel mesentery, likely postoperative. Few locules of extraluminal gas also noted, also presumably from recent surgery. Surgical drain in place in the mid and left pelvis. Musculoskeletal: No acute bony abnormality. IMPRESSION: Incidental small pulmonary emboli seen in the right lower lobe  pulmonary arterial branches. Interval colectomy. Right lower quadrant ostomy noted. No evidence of bowel obstruction. Edema/fluid throughout the mesentery, presumably postoperative. Few locules of free air in the abdomen, also likely postoperative. Small left pleural effusion with compressive atelectasis in the left lower lobe. 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 These are similar to prior study. These results will be called to the ordering clinician or representative by the Radiologist Assistant, and communication documented in the PACS or [employer]. Electronically Signed   By: Charlett Nose M.D.   On: 02/16/2023 19:37   [place] EKG SITE RITE  Result Date: 02/16/2023 If Site Rite image not attached, placement could not be confirmed due to current cardiac rhythm.       Scheduled Meds:  acetaminophen (TYLENOL) oral liquid 160 mg/5 mL  1,000 mg Per Tube Q6H   amitriptyline  25 mg Oral QHS   Chlorhexidine Gluconate Cloth  6 each Topical Daily   DULoxetine  30 mg Oral Daily   fentaNYL  1 patch Transdermal Q72H   ferrous sulfate  325 mg Oral BID WC   fiber supplement (BANATROL TF)  60 mL Per Tube QID   folic acid  1 mg Intravenous Daily   gabapentin  100 mg Oral Q8H   Gerhardt's butt cream   Topical BID   insulin aspart  0-9 Units Subcutaneous Q4H   lidocaine  2 patch Transdermal Q24H   loperamide  2 mg Oral QID   methocarbamol  1,000 mg Per Tube TID   multivitamin with minerals  1 tablet Oral Daily   mouth rinse  15 mL Mouth Rinse 4 times per day    pantoprazole (PROTONIX) IV  40 mg Intravenous Q24H   potassium chloride  40 mEq Oral BID   revefenacin  175 mcg Nebulization Daily   thiamine (VITAMIN B1) injection  100 mg Intravenous Daily   vancomycin  125 mg Per Tube QID   Vivonex RTF  1,000 mL Oral Q24H   Continuous Infusions:  cefTRIAXone (ROCEPHIN)  IV Stopped (02/16/23 2304)   heparin 1,600 Units/hr (02/17/23 1558)   levETIRAcetam Stopped (02/17/23 0914)   metronidazole 10 mL/hr at 02/17/23 1558   [START ON 02/18/2023] micafungin (MYCAMINE) 150 mg in sodium chloride 0.9 % 100 mL IVPB       LOS: 21 days   Time spent:  Azucena Fallen, DO [facility]  If 7PM-7AM, please contact night-coverage [email or web address]  02/17/2023, 4:10 PM

## 2023-02-18 ENCOUNTER — Encounter (HOSPITAL_COMMUNITY): Payer: Self-pay | Admitting: Pulmonary Disease

## 2023-02-18 ENCOUNTER — Encounter (HOSPITAL_COMMUNITY)
Admission: EM | Disposition: A | Discharge status: Home Health | Payer: Self-pay | Source: Home / Self Care | Attending: Student

## 2023-02-18 ENCOUNTER — Inpatient Hospital Stay (HOSPITAL_COMMUNITY): Payer: Medicaid Other | Admitting: Certified Registered Nurse Anesthetist

## 2023-02-18 ENCOUNTER — Inpatient Hospital Stay (HOSPITAL_COMMUNITY): Payer: Medicaid Other

## 2023-02-18 DIAGNOSIS — I361 Nonrheumatic tricuspid (valve) insufficiency: Secondary | ICD-10-CM

## 2023-02-18 DIAGNOSIS — B49 Unspecified mycosis: Secondary | ICD-10-CM | POA: Diagnosis not present

## 2023-02-18 DIAGNOSIS — A0472 Enterocolitis due to Clostridium difficile, not specified as recurrent: Secondary | ICD-10-CM | POA: Diagnosis not present

## 2023-02-18 DIAGNOSIS — I469 Cardiac arrest, cause unspecified: Secondary | ICD-10-CM | POA: Diagnosis not present

## 2023-02-18 DIAGNOSIS — I3139 Other pericardial effusion (noninflammatory): Secondary | ICD-10-CM

## 2023-02-18 DIAGNOSIS — G9341 Metabolic encephalopathy: Secondary | ICD-10-CM | POA: Diagnosis not present

## 2023-02-18 DIAGNOSIS — D735 Infarction of spleen: Secondary | ICD-10-CM

## 2023-02-18 DIAGNOSIS — I2699 Other pulmonary embolism without acute cor pulmonale: Secondary | ICD-10-CM

## 2023-02-18 DIAGNOSIS — B371 Pulmonary candidiasis: Secondary | ICD-10-CM

## 2023-02-18 DIAGNOSIS — B377 Candidal sepsis: Secondary | ICD-10-CM | POA: Diagnosis not present

## 2023-02-18 DIAGNOSIS — K50119 Crohn's disease of large intestine with unspecified complications: Secondary | ICD-10-CM | POA: Diagnosis not present

## 2023-02-18 HISTORY — DX: Other pulmonary embolism without acute cor pulmonale: I26.99

## 2023-02-18 HISTORY — PX: TRANSESOPHAGEAL ECHOCARDIOGRAM (CATH LAB): EP1270

## 2023-02-18 HISTORY — DX: Unspecified mycosis: B49

## 2023-02-18 LAB — CULTURE, BLOOD (ROUTINE X 2): Special Requests: ADEQUATE

## 2023-02-18 LAB — COMPREHENSIVE METABOLIC PANEL
ALT: 7 U/L (ref 0–44)
AST: 14 U/L — ABNORMAL LOW (ref 15–41)
Albumin: 2.5 g/dL — ABNORMAL LOW (ref 3.5–5.0)
Alkaline Phosphatase: 139 U/L — ABNORMAL HIGH (ref 38–126)
Anion gap: 13 (ref 5–15)
BUN: <5 mg/dL — ABNORMAL LOW (ref 6–20)
CO2: 22 mmol/L (ref 22–32)
Calcium: 9.1 mg/dL (ref 8.9–10.3)
Chloride: 99 mmol/L (ref 98–111)
Creatinine, Ser: 0.45 mg/dL (ref 0.44–1.00)
GFR, Estimated: >60 mL/min (ref >60)
Glucose, Bld: 111 mg/dL — ABNORMAL HIGH (ref 70–99)
Potassium: 4.4 mmol/L (ref 3.5–5.1)
Sodium: 134 mmol/L — ABNORMAL LOW (ref 135–145)
Total Bilirubin: 0.5 mg/dL (ref 0.3–1.2)
Total Protein: 5.6 g/dL — ABNORMAL LOW (ref 6.5–8.1)

## 2023-02-18 LAB — GLUCOSE, CAPILLARY
Glucose-Capillary: 105 mg/dL — ABNORMAL HIGH (ref 70–99)
Glucose-Capillary: 107 mg/dL — ABNORMAL HIGH (ref 70–99)
Glucose-Capillary: 111 mg/dL — ABNORMAL HIGH (ref 70–99)
Glucose-Capillary: 113 mg/dL — ABNORMAL HIGH (ref 70–99)
Glucose-Capillary: 114 mg/dL — ABNORMAL HIGH (ref 70–99)
Glucose-Capillary: 116 mg/dL — ABNORMAL HIGH (ref 70–99)
Glucose-Capillary: 99 mg/dL (ref 70–99)

## 2023-02-18 LAB — HEPARIN LEVEL (UNFRACTIONATED): Heparin Unfractionated: 0.45 [IU]/mL (ref 0.30–0.70)

## 2023-02-18 LAB — CBC
HCT: 30.8 % — ABNORMAL LOW (ref 36.0–46.0)
Hemoglobin: 9.8 g/dL — ABNORMAL LOW (ref 12.0–15.0)
MCH: 27.7 pg (ref 26.0–34.0)
MCHC: 31.8 g/dL (ref 30.0–36.0)
MCV: 87 fL (ref 80.0–100.0)
Platelets: 556 10*3/uL — ABNORMAL HIGH (ref 150–400)
RBC: 3.54 MIL/uL — ABNORMAL LOW (ref 3.87–5.11)
RDW: 16 % — ABNORMAL HIGH (ref 11.5–15.5)
WBC: 39.9 10*3/uL — ABNORMAL HIGH (ref 4.0–10.5)
nRBC: 0.4 % — ABNORMAL HIGH (ref 0.0–0.2)

## 2023-02-18 LAB — MAGNESIUM: Magnesium: 1.9 mg/dL (ref 1.7–2.4)

## 2023-02-18 LAB — ECHO TEE

## 2023-02-18 SURGERY — TRANSESOPHAGEAL ECHOCARDIOGRAM (TEE) (CATHLAB)
Anesthesia: Monitor Anesthesia Care

## 2023-02-18 MED ORDER — PROPOFOL 500 MG/50ML IV EMUL
INTRAVENOUS | Status: DC | PRN
  Administered 2023-02-18: 75 ug/kg/min via INTRAVENOUS

## 2023-02-18 MED ORDER — VANCOMYCIN 50 MG/ML ORAL SOLUTION
125.0000 mg | Freq: Four times a day (QID) | ORAL | Status: AC
  Administered 2023-02-18 – 2023-02-25 (×29): 125 mg via ORAL
  Filled 2023-02-18 (×30): qty 2.5

## 2023-02-18 MED ORDER — ACETAMINOPHEN 160 MG/5ML PO SOLN
1000.0000 mg | Freq: Four times a day (QID) | ORAL | Status: DC
  Administered 2023-02-18 – 2023-02-22 (×11): 1000 mg via ORAL
  Filled 2023-02-18 (×13): qty 40.6

## 2023-02-18 MED ORDER — METHOCARBAMOL 500 MG PO TABS
1000.0000 mg | ORAL_TABLET | Freq: Three times a day (TID) | ORAL | Status: DC
  Administered 2023-02-18 – 2023-03-16 (×77): 1000 mg via ORAL
  Filled 2023-02-18 (×78): qty 2

## 2023-02-18 MED ORDER — SODIUM CHLORIDE 0.9 % IV SOLN: INTRAVENOUS | Status: DC

## 2023-02-18 MED ORDER — BISMUTH SUBSALICYLATE 262 MG/15ML PO SUSP
30.0000 mL | ORAL | Status: DC | PRN
  Administered 2023-02-18 – 2023-03-10 (×4): 30 mL via ORAL
  Filled 2023-02-18: qty 236

## 2023-02-18 MED ORDER — LOPERAMIDE HCL 2 MG PO CAPS
4.0000 mg | ORAL_CAPSULE | Freq: Four times a day (QID) | ORAL | Status: DC
  Administered 2023-02-18 – 2023-03-19 (×114): 4 mg via ORAL
  Filled 2023-02-18 (×114): qty 2

## 2023-02-18 MED ORDER — PROPOFOL 10 MG/ML IV BOLUS
INTRAVENOUS | Status: DC | PRN
  Administered 2023-02-18: 30 mg via INTRAVENOUS

## 2023-02-18 MED ORDER — ALBUMIN HUMAN 5 % IV SOLN
INTRAVENOUS | Status: DC | PRN
Start: 2023-02-18 — End: 2023-02-18

## 2023-02-18 NOTE — Progress Notes (Signed)
  Echocardiogram Echocardiogram Transesophageal has been performed.  Delcie Roch 02/18/2023, 12:35 PM

## 2023-02-18 NOTE — Plan of Care (Signed)
CHL Tonsillectomy/Adenoidectomy, Postoperative PEDS care plan entered in error.

## 2023-02-18 NOTE — TOC Progression Note (Signed)
Transition of Care [facility]) - Progression Note    Patient Details  Name: [name] MRN: [id number] Date of Birth: 1970-10-24  Transition of Care [facility]) CM/SW Contact  Eduard Roux, [place] Phone Number: 02/18/2023, 11:02 AM  Clinical Narrative:     TOC continues to follow   Expected Discharge Plan: Skilled Nursing Facility Barriers to Discharge: Continued Medical Work up  Expected Discharge Plan and Services In-house Referral: Clinical Social Work Discharge Planning Services: CM Consult   Living arrangements for the past 2 months: Single Family Home                                       Social Determinants of Health (SDOH) Interventions SDOH Screenings   Food Insecurity: Food Insecurity Present (10/18/2022)   Received from [facility]  Housing: Low Risk  (01/16/2022)  Transportation Needs: Unmet Transportation Needs (10/18/2022)   Received from [facility]  Utilities: Not At Risk (10/18/2022)   Received from [facility]  Financial Resource Strain: Medium Risk (10/18/2022)   Received from [facility]  Social Connections: Unknown (09/01/2021)   Received from [facility], [facility]  Stress: Stress Concern Present (06/25/2020)   Received from [facility], [facility]  Tobacco Use: High Risk (02/11/2023)    Readmission Risk Interventions     No data to display

## 2023-02-18 NOTE — Progress Notes (Signed)
Speech Language Pathology Treatment: Dysphagia  Patient Details Name: [name] MRN: [id number] DOB: 22-Sep-1970 Today's Date: 02/18/2023 Time: 4098-1191 SLP Time Calculation (min) (ACUTE ONLY): 9 min  Assessment / Plan / Recommendation Clinical Impression  Pt returned from TEE awake, in pain (RN gave pain meds earlier) and needed encouragement to participate with head of bed reclined slightly per pt request due to pain. She was able to masticate regular texture without delays or residue stating she did not want more because it was dry. Straw sips thin consumed without s/s aspiration consistently. After TEE RN ordered a Dys 3 diet however pt was on soft diet per GI prior to procedure therefore ST changed order to soft. GI can advance her as pt is able. No follow up for swallow is needed at this time.   SLP attempted a cognitive activity however pt politely declined stating "I'm in pain and can't do that right now." ST will continue to follow for cognition although feel she may be close to functional status in the acute care setting.    HPI HPI: 52 yo female admitted 9/26 after found down for an unspecified amount of time with possible GIB, agonal breathing and pulseless with CPR 5 min. Intubated in [facility] ED and transferred to [facility]. Pt with AKI, elevated lactate, septic shock. 10/2 extubated and EGD. On 10/11 underwent total abdominal colectomy with ileostomy. 10/13 became tachycardic and hypotensive.  More lethargic. Transferred back to ICU. PMhx: COPD, chron's, HTN, DM, PTSD, seizure disorder, chronic back pain      SLP Plan  All goals met (swallow goals met- continue for cognition)      Recommendations for follow up therapy are one component of a multi-disciplinary discharge planning process, led by the attending physician.  Recommendations may be updated based on patient status, additional functional criteria and insurance authorization.    Recommendations  Diet recommendations:  Other(comment);Thin liquid (soft per GI recommendations, pt could have regular texture) Liquids provided via: Cup;Straw Medication Administration: Whole meds with liquid Supervision: Patient able to self feed Compensations: Slow rate;Small sips/bites Postural Changes and/or Swallow Maneuvers: Seated upright 90 degrees                  Oral care BID   None Dysphagia, unspecified (R13.10)     All goals met (swallow goals met- continue for cognition)     Royce Macadamia  02/18/2023, 2:42 PM

## 2023-02-18 NOTE — Progress Notes (Signed)
Upon picking up patient from 4E 18, wound vac "beeping", Charge RN, Arlys John, made aware, device checked and turned off by [employer] Nurse, safety maintained, pt transferred to cath lab holding

## 2023-02-18 NOTE — Progress Notes (Signed)
Progress Note  7 Days Post-Op  Subjective: Sleeping this AM. Ileostomy output improving. She is unsure if she ate much yesterday. TEE planned for today   Objective: Vital signs in last 24 hours: Temp:  [97.4 F (36.3 C)-99.7 F (37.6 C)] 98.7 F (37.1 C) (10/18 0839) Pulse Rate:  [110-126] 121 (10/18 0839) Resp:  [16-20] 18 (10/18 0839) BP: (130-143)/(47-75) 143/75 (10/18 0839) SpO2:  [93 %-95 %] 94 % (10/18 0839) Weight:  [66.6 kg] 66.6 kg (10/18 0333) Last BM Date : 02/17/23  Intake/Output from previous day: 10/17 0701 - 10/18 0700 In: 2388.9 [P.O.:840; I.V.:384.1; NG/GT:400; IV Piggyback:764.8] Out: 4035 [Urine:1750; Drains:180; Stool:2105] Intake/Output this shift: No intake/output data recorded.  PE: General: pleasant, WD, WN female who is laying in bed in NAD Heart: sinus tachycardia in the 120s Lungs: Respiratory effort nonlabored Abd: soft, appropriately ttp, VAC to midline w/ good seal, ostomy pink with large volume liquid, drain purulent    Lab Results:  Recent Labs    02/17/23 0321 02/18/23 0539  WBC 44.4* 39.9*  HGB 9.3* 9.8*  HCT 29.4* 30.8*  PLT 672* 556*   BMET Recent Labs    02/17/23 0321 02/18/23 0539  NA 136 134*  K 3.3* 4.4  CL 97* 99  CO2 24 22  GLUCOSE 134* 111*  BUN 5* <5*  CREATININE 0.37* 0.45  CALCIUM 9.0 9.1   PT/INR No results for input(s): "LABPROT", "INR" in the last 72 hours. CMP     Component Value Date/Time   NA 134 (L) 02/18/2023 0539   K 4.4 02/18/2023 0539   CL 99 02/18/2023 0539   CO2 22 02/18/2023 0539   GLUCOSE 111 (H) 02/18/2023 0539   BUN <5 (L) 02/18/2023 0539   CREATININE 0.45 02/18/2023 0539   CREATININE 0.68 06/09/2021 1305   CALCIUM 9.1 02/18/2023 0539   PROT 5.6 (L) 02/18/2023 0539   ALBUMIN 2.5 (L) 02/18/2023 0539   AST 14 (L) 02/18/2023 0539   ALT 7 02/18/2023 0539   ALKPHOS 139 (H) 02/18/2023 0539   BILITOT 0.5 02/18/2023 0539   GFRNONAA >60 02/18/2023 0539   GFRAA >60 07/30/2019 1301    Lipase     Component Value Date/Time   LIPASE 27 07/09/2022 1313       Studies/Results: CT ABDOMEN PELVIS W CONTRAST  Result Date: 02/16/2023 CLINICAL DATA:  Abdominal pain, postop EXAM: CT ABDOMEN AND PELVIS WITH CONTRAST TECHNIQUE: Multidetector CT imaging of the abdomen and pelvis was performed using the standard protocol following bolus administration of intravenous contrast. RADIATION DOSE REDUCTION: This exam was performed according to the departmental dose-optimization program which includes automated exposure control, adjustment of the mA and/or kV according to patient size and/or use of iterative reconstruction technique. CONTRAST:  75mL OMNIPAQUE IOHEXOL 350 MG/ML SOLN COMPARISON:  01/31/2023 FINDINGS: Lower chest: Filling defects seen within right lower lobe pulmonary arteries compatible with incidental pulmonary emboli. Small left pleural effusion with compressive atelectasis in the left lower lobe. Dependent atelectasis in the right lower lobe. Hepatobiliary: No focal liver abnormality is seen. Status post cholecystectomy. No biliary dilatation. Pancreas: No focal abnormality or ductal dilatation. Spleen: Linear and wedge-shaped hypodensities throughout the spleen most compatible with splenic infarcts. This is similar to prior study. Adrenals/Urinary Tract: No adrenal abnormality. No focal renal abnormality. No stones or hydronephrosis. Urinary bladder is unremarkable. Stomach/Bowel: Interval total colectomy. Feeding tube in place with the tip in the proximal duodenum. Small bowel decompressed. Right lower quadrant ostomy noted. There is edema and fluid  throughout the small bowel mesentery with surgical drains in place. Vascular/Lymphatic: No evidence of aneurysm or adenopathy. Reproductive: Prior hysterectomy.  No adnexal masses. Other: There is edema/fluid throughout the small bowel mesentery, likely postoperative. Few locules of extraluminal gas also noted, also presumably from  recent surgery. Surgical drain in place in the mid and left pelvis. Musculoskeletal: No acute bony abnormality. IMPRESSION: Incidental small pulmonary emboli seen in the right lower lobe pulmonary arterial branches. Interval colectomy. Right lower quadrant ostomy noted. No evidence of bowel obstruction. Edema/fluid throughout the mesentery, presumably postoperative. Few locules of free air in the abdomen, also likely postoperative. Small left pleural effusion with compressive atelectasis in the left lower lobe. 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 These are similar to prior study. These results will be called to the ordering clinician or representative by the Radiologist Assistant, and communication documented in the PACS or [employer]. Electronically Signed   By: Charlett Nose M.D.   On: 02/16/2023 19:37    Anti-infectives: Anti-infectives (From admission, onward)    Start     Dose/Rate Route Frequency Ordered Stop   02/18/23 0400  micafungin (MYCAMINE) 150 mg in sodium chloride 0.9 % 100 mL IVPB        150 mg 107.5 mL/hr over 1 Hours Intravenous Every 24 hours 02/17/23 0936     02/17/23 0900  vancomycin (VANCOREADY) IVPB 750 mg/150 mL  Status:  Discontinued        750 mg 150 mL/hr over 60 Minutes Intravenous Every 8 hours 02/17/23 0204 02/17/23 0947   02/17/23 0400  micafungin (MYCAMINE) 100 mg in sodium chloride 0.9 % 100 mL IVPB  Status:  Discontinued        100 mg 105 mL/hr over 1 Hours Intravenous Every 24 hours 02/16/23 0353 02/17/23 0936   02/16/23 1045  vancomycin (VANCOCIN) 50 mg/mL oral solution SOLN 125 mg        125 mg Per Tube 4 times daily 02/16/23 0953 02/26/23 0959   02/16/23 0415  micafungin (MYCAMINE) 100 mg in sodium chloride 0.9 % 100 mL IVPB        100 mg 105 mL/hr over 1 Hours Intravenous STAT 02/16/23 0353 02/16/23 0610   02/15/23 1300  vancomycin (VANCOREADY) IVPB 750 mg/150 mL  Status:  Discontinued        750  mg 150 mL/hr over 60 Minutes Intravenous Every 12 hours 02/14/23 1156 02/17/23 0204   02/14/23 1130  vancomycin (VANCOCIN) IVPB 1000 mg/200 mL premix        1,000 mg 200 mL/hr over 60 Minutes Intravenous  Once 02/14/23 1043 02/14/23 1249   02/11/23 2000  cefTRIAXone (ROCEPHIN) 2 g in sodium chloride 0.9 % 100 mL IVPB       Note to Pharmacy: Pharmacy may adjust dosing strength / duration / interval for maximal efficacy   2 g 200 mL/hr over 30 Minutes Intravenous Every 24 hours 02/11/23 1622     02/10/23 1600  metroNIDAZOLE (FLAGYL) IVPB 500 mg        500 mg 100 mL/hr over 60 Minutes Intravenous Every 12 hours 02/10/23 1444     02/08/23 1800  vancomycin (VANCOCIN) 50 mg/mL oral solution SOLN 500 mg        500 mg Per Tube Every 6 hours 02/08/23 1325 02/12/23 1759   02/03/23 2200  metroNIDAZOLE (FLAGYL) tablet 500 mg        500 mg Per Tube Every 12 hours 02/03/23 1406 02/06/23 2148  02/02/23 1815  vancomycin (VANCOCIN) 50 mg/mL oral solution SOLN 125 mg  Status:  Discontinued        125 mg Per Tube Every 6 hours 02/02/23 1717 02/08/23 1325   02/02/23 1545  vancomycin (VANCOCIN) capsule 125 mg  Status:  Discontinued        125 mg Oral 4 times daily 02/02/23 1530 02/02/23 1717   01/27/23 1430  cefTRIAXone (ROCEPHIN) 2 g in sodium chloride 0.9 % 100 mL IVPB        2 g 200 mL/hr over 30 Minutes Intravenous Every 24 hours 01/27/23 1340 02/05/23 1403   01/27/23 1400  metroNIDAZOLE (FLAGYL) IVPB 500 mg  Status:  Discontinued        500 mg 100 mL/hr over 60 Minutes Intravenous Every 12 hours 01/27/23 1340 02/03/23 1406   01/27/23 0545  vancomycin (VANCOREADY) IVPB 1250 mg/250 mL        1,250 mg 166.7 mL/hr over 90 Minutes Intravenous  Once 01/27/23 0542 01/27/23 0809   01/27/23 0545  ceFEPIme (MAXIPIME) 2 g in sodium chloride 0.9 % 100 mL IVPB        2 g 200 mL/hr over 30 Minutes Intravenous  Once 01/27/23 0542 01/27/23 0621   01/27/23 0545  metroNIDAZOLE (FLAGYL) IVPB 500 mg        500 mg 100  mL/hr over 60 Minutes Intravenous  Once 01/27/23 0542 01/27/23 0741        Assessment/Plan Refractory Crohn's disease 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 Luisa Hart  - Leukocytosis trending down today - 39K from 44K  - no signs of undrained abdominal collection on CT 10/16 - Agree with TEE today - ok for soft diet after procedure, increased imodium to 4 mg QID  - no PICC/TPN in setting of bacteremia/possible fungemia  - Continue drain - continue medical treatment for C.diff - mobilize - WOC following for new ileostomy and VAC - ok to change VAC M/R - will follow peripherally through the weekend unless acute surgical issues arise, otherwise will see again Monday   FEN: NPO for TEE VTE: heparin gtt ID: rocephin/flagyl/micafungin, PO vanc  - per CCM -  Acute PEs - heparin gtt, ?etiology  Splenic infarcts - nothing surgical recommended at this time but agree with ID need to eval etiology of emboli  COPD HTN Chronic pain  Collagen vascular disease T2DM Seizure disorder  LOS: 22 days     [name], [facility] 02/18/2023, 11:01 AM Please see Amion for pager number during day hours 7:00am-4:30pm

## 2023-02-18 NOTE — Transfer of Care (Signed)
Immediate Anesthesia Transfer of Care Note  Patient: Tiney Mapp  Procedure(s) Performed: TRANSESOPHAGEAL ECHOCARDIOGRAM  Patient Location: Cath Lab  Anesthesia Type:MAC  Level of Consciousness: drowsy and patient cooperative  Airway & Oxygen Therapy: Patient Spontanous Breathing and Patient connected to nasal cannula oxygen  Post-op Assessment: Report given to RN, Post -op Vital signs reviewed and stable, and Patient moving all extremities X 4  Post vital signs: Reviewed and stable  Last Vitals:  Vitals Value Taken Time  BP 126/57   Temp    Pulse 111   Resp 13   SpO2 95     Last Pain:  Vitals:   02/18/23 1139  TempSrc: Temporal  PainSc:       Patients Stated Pain Goal: 3 (02/15/23 0944)  Complications: No notable events documented.

## 2023-02-18 NOTE — Anesthesia Preprocedure Evaluation (Addendum)
Anesthesia Evaluation  Patient identified by MRN, date of birth, ID band Patient awake    Reviewed: Allergy & Precautions, H&P , NPO status , Patient's Chart, lab work & pertinent test results, reviewed documented beta blocker date and time   Airway Mallampati: II  TM Distance: >3 FB Neck ROM: Full    Dental no notable dental hx. (+) Teeth Intact, Dental Advisory Given   Pulmonary asthma , COPD,  COPD inhaler, Current Smoker   Pulmonary exam normal breath sounds clear to auscultation       Cardiovascular hypertension, Pt. on medications and Pt. on home beta blockers + DOE  Normal cardiovascular exam Rhythm:Regular Rate:Normal     Neuro/Psych Seizures -, Well Controlled,   Anxiety Depression       GI/Hepatic Neg liver ROS,GERD  Medicated,,  Endo/Other  diabetes, Insulin Dependent    Renal/GU Renal disease  negative genitourinary   Musculoskeletal  (+) Arthritis , Osteoarthritis,    Abdominal   Peds  Hematology  (+) Blood dyscrasia, anemia   Anesthesia Other Findings   Reproductive/Obstetrics negative OB ROS                             Anesthesia Physical Anesthesia Plan  ASA: 4  Anesthesia Plan: MAC   Post-op Pain Management:    Induction: Intravenous  PONV Risk Score and Plan: 3 and TIVA  Airway Management Planned: Natural Airway  Additional Equipment:   Intra-op Plan:   Post-operative Plan: Extubation in OR  Informed Consent: I have reviewed the patients History and Physical, chart, labs and discussed the procedure including the risks, benefits and alternatives for the proposed anesthesia with the patient or authorized representative who has indicated his/her understanding and acceptance.     Dental advisory given  Plan Discussed with: CRNA  Anesthesia Plan Comments: (This is a 52 year old female with past medical history of COPD, Crohn's on Skyrizi, hypertension, type  2 diabetes mellitus who initially presented to the after being found unresponsive. Per family, patient has had bloody emesis with diarrhea over the past few days. Initial systolics in the 60s in the ED, and patient was minimally responsive. In the emergency room, patient was intubated, and patient was admitted to the ICU for further evaluation and management for shock and altered mental status.    TEE today to rule out endocarditis in the setting of bacteremia )       Anesthesia Quick Evaluation

## 2023-02-18 NOTE — Progress Notes (Signed)
Subjective: No new complaints   Antibiotics:  Anti-infectives (From admission, onward)    Start     Dose/Rate Route Frequency Ordered Stop   02/18/23 1800  vancomycin (VANCOCIN) 50 mg/mL oral solution SOLN 125 mg        125 mg Oral 4 times daily 02/18/23 1439 02/26/23 0959   02/18/23 0400  micafungin (MYCAMINE) 150 mg in sodium chloride 0.9 % 100 mL IVPB        150 mg 107.5 mL/hr over 1 Hours Intravenous Every 24 hours 02/17/23 0936     02/17/23 0900  vancomycin (VANCOREADY) IVPB 750 mg/150 mL  Status:  Discontinued        750 mg 150 mL/hr over 60 Minutes Intravenous Every 8 hours 02/17/23 0204 02/17/23 0947   02/17/23 0400  micafungin (MYCAMINE) 100 mg in sodium chloride 0.9 % 100 mL IVPB  Status:  Discontinued        100 mg 105 mL/hr over 1 Hours Intravenous Every 24 hours 02/16/23 0353 02/17/23 0936   02/16/23 1045  vancomycin (VANCOCIN) 50 mg/mL oral solution SOLN 125 mg  Status:  Discontinued        125 mg Per Tube 4 times daily 02/16/23 0953 02/18/23 1439   02/16/23 0415  micafungin (MYCAMINE) 100 mg in sodium chloride 0.9 % 100 mL IVPB        100 mg 105 mL/hr over 1 Hours Intravenous STAT 02/16/23 0353 02/16/23 0610   02/15/23 1300  vancomycin (VANCOREADY) IVPB 750 mg/150 mL  Status:  Discontinued        750 mg 150 mL/hr over 60 Minutes Intravenous Every 12 hours 02/14/23 1156 02/17/23 0204   02/14/23 1130  vancomycin (VANCOCIN) IVPB 1000 mg/200 mL premix        1,000 mg 200 mL/hr over 60 Minutes Intravenous  Once 02/14/23 1043 02/14/23 1249   02/11/23 2000  cefTRIAXone (ROCEPHIN) 2 g in sodium chloride 0.9 % 100 mL IVPB       Note to Pharmacy: Pharmacy may adjust dosing strength / duration / interval for maximal efficacy   2 g 200 mL/hr over 30 Minutes Intravenous Every 24 hours 02/11/23 1622     02/10/23 1600  metroNIDAZOLE (FLAGYL) IVPB 500 mg        500 mg 100 mL/hr over 60 Minutes Intravenous Every 12 hours 02/10/23 1444     02/08/23 1800  vancomycin  (VANCOCIN) 50 mg/mL oral solution SOLN 500 mg        500 mg Per Tube Every 6 hours 02/08/23 1325 02/12/23 1759   02/03/23 2200  metroNIDAZOLE (FLAGYL) tablet 500 mg        500 mg Per Tube Every 12 hours 02/03/23 1406 02/06/23 2148   02/02/23 1815  vancomycin (VANCOCIN) 50 mg/mL oral solution SOLN 125 mg  Status:  Discontinued        125 mg Per Tube Every 6 hours 02/02/23 1717 02/08/23 1325   02/02/23 1545  vancomycin (VANCOCIN) capsule 125 mg  Status:  Discontinued        125 mg Oral 4 times daily 02/02/23 1530 02/02/23 1717   01/27/23 1430  cefTRIAXone (ROCEPHIN) 2 g in sodium chloride 0.9 % 100 mL IVPB        2 g 200 mL/hr over 30 Minutes Intravenous Every 24 hours 01/27/23 1340 02/05/23 1403   01/27/23 1400  metroNIDAZOLE (FLAGYL) IVPB 500 mg  Status:  Discontinued        500  mg 100 mL/hr over 60 Minutes Intravenous Every 12 hours 01/27/23 1340 02/03/23 1406   01/27/23 0545  vancomycin (VANCOREADY) IVPB 1250 mg/250 mL        1,250 mg 166.7 mL/hr over 90 Minutes Intravenous  Once 01/27/23 0542 01/27/23 0809   01/27/23 0545  ceFEPIme (MAXIPIME) 2 g in sodium chloride 0.9 % 100 mL IVPB        2 g 200 mL/hr over 30 Minutes Intravenous  Once 01/27/23 0542 01/27/23 0621   01/27/23 0545  metroNIDAZOLE (FLAGYL) IVPB 500 mg        500 mg 100 mL/hr over 60 Minutes Intravenous  Once 01/27/23 0542 01/27/23 0741       Medications: Scheduled Meds:  acetaminophen (TYLENOL) oral liquid 160 mg/5 mL  1,000 mg Oral Q6H   amitriptyline  25 mg Oral QHS   Chlorhexidine Gluconate Cloth  6 each Topical Daily   DULoxetine  30 mg Oral Daily   fentaNYL  1 patch Transdermal Q72H   ferrous sulfate  325 mg Oral BID WC   fiber supplement (BANATROL TF)  60 mL Per Tube QID   folic acid  1 mg Intravenous Daily   gabapentin  100 mg Oral Q8H   Gerhardt's butt cream   Topical BID   insulin aspart  0-9 Units Subcutaneous Q4H   lidocaine  2 patch Transdermal Q24H   loperamide  4 mg Oral QID   methocarbamol   1,000 mg Oral TID   multivitamin with minerals  1 tablet Oral Daily   mouth rinse  15 mL Mouth Rinse 4 times per day   pantoprazole (PROTONIX) IV  40 mg Intravenous Q24H   revefenacin  175 mcg Nebulization Daily   thiamine (VITAMIN B1) injection  100 mg Intravenous Daily   vancomycin  125 mg Oral QID   Vivonex RTF  1,000 mL Oral Q24H   Continuous Infusions:  cefTRIAXone (ROCEPHIN)  IV Stopped (02/17/23 2111)   heparin 1,900 Units/hr (02/18/23 1151)   levETIRAcetam 750 mg (02/18/23 1017)   metronidazole Stopped (02/18/23 0737)   micafungin (MYCAMINE) 150 mg in sodium chloride 0.9 % 100 mL IVPB Stopped (02/18/23 0554)   PRN Meds:.dextrose, fentaNYL (SUBLIMAZE) injection, hydrOXYzine, levalbuterol, LORazepam, magic mouthwash, metoprolol tartrate, ondansetron (ZOFRAN) IV    Objective: Weight change:   Intake/Output Summary (Last 24 hours) at 02/18/2023 1618 Last data filed at 02/18/2023 1300 Gross per 24 hour  Intake 1597.85 ml  Output 3595 ml  Net -1997.15 ml   Blood pressure (!) 145/59, pulse (!) 116, temperature 98.7 F (37.1 C), temperature source Oral, resp. rate (!) 21, height 5' 2.99" (1.6 m), weight 66.6 kg, SpO2 94%. Temp:  [97.4 F (36.3 C)-99.7 F (37.6 C)] 98.7 F (37.1 C) (10/18 1316) Pulse Rate:  [110-123] 116 (10/18 1316) Resp:  [16-21] 21 (10/18 1316) BP: (130-145)/(47-100) 145/59 (10/18 1316) SpO2:  [92 %-95 %] 94 % (10/18 1316) Weight:  [66.6 kg] 66.6 kg (10/18 0333)  Physical Exam: Physical Exam Constitutional:      General: She is not in acute distress.    Appearance: She is well-developed. She is not diaphoretic.  HENT:     Head: Normocephalic and atraumatic.     Right Ear: External ear normal.     Left Ear: External ear normal.     Mouth/Throat:     Pharynx: No oropharyngeal exudate.  Eyes:     General: No scleral icterus.    Conjunctiva/sclera: Conjunctivae normal.     Pupils: Pupils are  equal, round, and reactive to light.  Cardiovascular:      Rate and Rhythm: Normal rate and regular rhythm.     Heart sounds:     No gallop.  Pulmonary:     Effort: Pulmonary effort is normal. No respiratory distress.     Breath sounds: Normal breath sounds. No wheezing or rales.  Abdominal:     Palpations: Abdomen is soft.  Musculoskeletal:        General: No tenderness. Normal range of motion.  Lymphadenopathy:     Cervical: No cervical adenopathy.  Skin:    General: Skin is warm and dry.     Coloration: Skin is pale.     Findings: No erythema or rash.  Neurological:     General: No focal deficit present.     Mental Status: She is alert and oriented to person, place, and time.     Motor: No abnormal muscle tone.  Psychiatric:        Mood and Affect: Mood normal.        Behavior: Behavior normal.        Thought Content: Thought content normal.        Judgment: Judgment normal.      CBC:    BMET Recent Labs    02/17/23 0321 02/18/23 0539  NA 136 134*  K 3.3* 4.4  CL 97* 99  CO2 24 22  GLUCOSE 134* 111*  BUN 5* <5*  CREATININE 0.37* 0.45  CALCIUM 9.0 9.1     Liver Panel  Recent Labs    02/17/23 0321 02/18/23 0539  PROT 5.3* 5.6*  ALBUMIN 2.6* 2.5*  AST 14* 14*  ALT 9 7  ALKPHOS 132* 139*  BILITOT 0.4 0.5       Sedimentation Rate No results for input(s): "ESRSEDRATE" in the last 72 hours. C-Reactive Protein No results for input(s): "CRP" in the last 72 hours.  Micro Results: Recent Results (from the past 720 hour(s))  Blood culture (routine x 2)     Status: Abnormal   Collection Time: 01/27/23  6:15 AM   Specimen: BLOOD  Result Value Ref Range Status   Specimen Description   Final    BLOOD CENT CENTRAL LINE Performed at [facility], [address], [place], [place] [address]    Special Requests   Final    BOTTLES DRAWN AEROBIC AND ANAEROBIC Blood Culture adequate volume Performed at [facility], [address], [place], [place] [address]    Culture  Setup Time   Final    GRAM POSITIVE  COCCI ANAEROBIC BOTTLE ONLY CRITICAL RESULT CALLED TO, READ BACK BY AND VERIFIED WITH: SAMANTHA IZQUIERDO AT 0981 01/28/2023 BY T KENNEDY CRITICAL RESULT CALLED TO, READ BACK BY AND VERIFIED WITH: PHARMD W Dareen Piano [id number] AT 1011 BY CM    Culture (A)  Final    STAPHYLOCOCCUS HOMINIS THE SIGNIFICANCE OF ISOLATING THIS ORGANISM FROM A SINGLE SET OF BLOOD CULTURES WHEN MULTIPLE SETS ARE DRAWN IS UNCERTAIN. PLEASE NOTIFY THE MICROBIOLOGY DEPARTMENT WITHIN ONE WEEK IF SPECIATION AND SENSITIVITIES ARE REQUIRED. Performed at [facility], [address], [place], [place] [address]    Report Status 02/01/2023 FINAL  Final  Blood culture (routine x 2)     Status: None   Collection Time: 01/27/23  6:15 AM   Specimen: BLOOD  Result Value Ref Range Status   Specimen Description BLOOD BLOOD LEFT HAND  Final   Special Requests   Final    BOTTLES DRAWN AEROBIC  ONLY Blood Culture results may not be optimal due to an inadequate volume of blood received in culture bottles   Culture   Final    NO GROWTH 5 DAYS Performed at [facility], [address] [place], [place] [address]    Report Status 02/01/2023 FINAL  Final  Blood Culture ID Panel (Reflexed)     Status: Abnormal   Collection Time: 01/27/23  6:15 AM  Result Value Ref Range Status   Enterococcus faecalis NOT DETECTED NOT DETECTED Final   Enterococcus Faecium NOT DETECTED NOT DETECTED Final   Listeria monocytogenes NOT DETECTED NOT DETECTED Final   Staphylococcus species DETECTED (A) NOT DETECTED Final    Comment: CRITICAL RESULT CALLED TO, READ BACK BY AND VERIFIED WITH: Marita Snellen [id number] AT 1012 BY CM    Staphylococcus aureus (BCID) NOT DETECTED NOT DETECTED Final   Staphylococcus epidermidis NOT DETECTED NOT DETECTED Final   Staphylococcus lugdunensis NOT DETECTED NOT DETECTED Final   Streptococcus species NOT DETECTED NOT DETECTED Final   Streptococcus agalactiae NOT DETECTED NOT DETECTED Final   Streptococcus pneumoniae  NOT DETECTED NOT DETECTED Final   Streptococcus pyogenes NOT DETECTED NOT DETECTED Final   A.calcoaceticus-baumannii NOT DETECTED NOT DETECTED Final   Bacteroides fragilis NOT DETECTED NOT DETECTED Final   Enterobacterales NOT DETECTED NOT DETECTED Final   Enterobacter cloacae complex NOT DETECTED NOT DETECTED Final   Escherichia coli NOT DETECTED NOT DETECTED Final   Klebsiella aerogenes NOT DETECTED NOT DETECTED Final   Klebsiella oxytoca NOT DETECTED NOT DETECTED Final   Klebsiella pneumoniae NOT DETECTED NOT DETECTED Final   Proteus species NOT DETECTED NOT DETECTED Final   Salmonella species NOT DETECTED NOT DETECTED Final   Serratia marcescens NOT DETECTED NOT DETECTED Final   Haemophilus influenzae NOT DETECTED NOT DETECTED Final   Neisseria meningitidis NOT DETECTED NOT DETECTED Final   Pseudomonas aeruginosa NOT DETECTED NOT DETECTED Final   Stenotrophomonas maltophilia NOT DETECTED NOT DETECTED Final   Candida albicans NOT DETECTED NOT DETECTED Final   Candida auris NOT DETECTED NOT DETECTED Final   Candida glabrata NOT DETECTED NOT DETECTED Final   Candida krusei NOT DETECTED NOT DETECTED Final   Candida parapsilosis NOT DETECTED NOT DETECTED Final   Candida tropicalis NOT DETECTED NOT DETECTED Final   Cryptococcus neoformans/gattii NOT DETECTED NOT DETECTED Final    Comment: Performed at [facility], [address] [place], [place] [address]  Resp panel by RT-PCR (RSV, Flu A&B, Covid) Anterior Nasal Swab     Status: None   Collection Time: 01/27/23  9:23 AM   Specimen: Anterior Nasal Swab  Result Value Ref Range Status   SARS Coronavirus 2 by RT PCR NEGATIVE NEGATIVE Final    Comment: (NOTE) SARS-CoV-2 target nucleic acids are NOT DETECTED.  The SARS-CoV-2 RNA is generally detectable in upper respiratory specimens during the acute phase of infection. The lowest concentration of SARS-CoV-2 viral copies this assay can detect is 138 copies/mL. A negative result  does not preclude SARS-Cov-2 infection and should not be used as the sole basis for treatment or other patient management decisions. A negative result may occur with  improper specimen collection/handling, submission of specimen other than nasopharyngeal swab, presence of viral mutation(s) within the areas targeted by this assay, and inadequate number of viral copies(<138 copies/mL). A negative result must be combined with clinical observations, patient history, and epidemiological information. The expected result is Negative.  Fact Sheet for Patients:  BloggerCourse.com  Fact Sheet for Healthcare Providers:  SeriousBroker.it  This test is no t yet approved or cleared by the [place] and  has been authorized for detection and/or diagnosis of SARS-CoV-2 by FDA under an Emergency Use Authorization (EUA). This EUA will remain  in effect (meaning this test can be used) for the duration of the COVID-19 declaration under Section 564(b)(1) of the Act, 21 U.S.C.section 360bbb-3(b)(1), unless the authorization is terminated  or revoked sooner.       Influenza A by PCR NEGATIVE NEGATIVE Final   Influenza B by PCR NEGATIVE NEGATIVE Final    Comment: (NOTE) The Xpert Xpress SARS-CoV-2/FLU/RSV plus assay is intended as an aid in the diagnosis of influenza from Nasopharyngeal swab specimens and should not be used as a sole basis for treatment. Nasal washings and aspirates are unacceptable for Xpert Xpress SARS-CoV-2/FLU/RSV testing.  Fact Sheet for Patients: BloggerCourse.com  Fact Sheet for Healthcare Providers: SeriousBroker.it  This test is not yet approved or cleared by the [place] FDA and has been authorized for detection and/or diagnosis of SARS-CoV-2 by FDA under an Emergency Use Authorization (EUA). This EUA will remain in effect (meaning this test can be used) for  the duration of the COVID-19 declaration under Section 564(b)(1) of the Act, 21 U.S.C. section 360bbb-3(b)(1), unless the authorization is terminated or revoked.     Resp Syncytial Virus by PCR NEGATIVE NEGATIVE Final    Comment: (NOTE) Fact Sheet for Patients: BloggerCourse.com  Fact Sheet for Healthcare Providers: SeriousBroker.it  This test is not yet approved or cleared by the [place] FDA and has been authorized for detection and/or diagnosis of SARS-CoV-2 by FDA under an Emergency Use Authorization (EUA). This EUA will remain in effect (meaning this test can be used) for the duration of the COVID-19 declaration under Section 564(b)(1) of the Act, 21 U.S.C. section 360bbb-3(b)(1), unless the authorization is terminated or revoked.  Performed at [facility], [address], [place], [place] [address]   MRSA Next Gen by PCR, Nasal     Status: None   Collection Time: 01/27/23 10:53 AM   Specimen: Nasal Mucosa; Nasal Swab  Result Value Ref Range Status   MRSA by PCR Next Gen NOT DETECTED NOT DETECTED Final    Comment: (NOTE) The GeneXpert MRSA Assay (FDA approved for NASAL specimens only), is one component of a comprehensive MRSA colonization surveillance program. It is not intended to diagnose MRSA infection nor to guide or monitor treatment for MRSA infections. Test performance is not FDA approved in patients less than 62 years old. Performed at [facility], [address], [place], [place] [address]   Calprotectin, Fecal     Status: Abnormal   Collection Time: 01/30/23  3:31 AM   Specimen: Stool  Result Value Ref Range Status   Calprotectin, Fecal 562 (H) 0 - 120 ug/g Final    Comment: (NOTE) Concentration     Interpretation   Follow-Up < 5 - 50 ug/g     Normal           None >50 -120 ug/g     Borderline       Re-evaluate in 4-6 weeks    >120 ug/g     Abnormal         Repeat as clinically                                    indicated Performed At: [facility]  [place] [address] [place], [place] [id number] Jolene Schimke MD WG:[id number]   Gastrointestinal Panel by PCR , Stool     Status: None   Collection Time: 01/30/23  3:31 AM   Specimen: Stool  Result Value Ref Range Status   Campylobacter species NOT DETECTED NOT DETECTED Final   Plesimonas shigelloides NOT DETECTED NOT DETECTED Final   Salmonella species NOT DETECTED NOT DETECTED Final   Yersinia enterocolitica NOT DETECTED NOT DETECTED Final   Vibrio species NOT DETECTED NOT DETECTED Final   Vibrio cholerae NOT DETECTED NOT DETECTED Final   Enteroaggregative E coli (EAEC) NOT DETECTED NOT DETECTED Final   Enteropathogenic E coli (EPEC) NOT DETECTED NOT DETECTED Final   Enterotoxigenic E coli (ETEC) NOT DETECTED NOT DETECTED Final   Shiga like toxin producing E coli (STEC) NOT DETECTED NOT DETECTED Final   Shigella/Enteroinvasive E coli (EIEC) NOT DETECTED NOT DETECTED Final   Cryptosporidium NOT DETECTED NOT DETECTED Final   Cyclospora cayetanensis NOT DETECTED NOT DETECTED Final   Entamoeba histolytica NOT DETECTED NOT DETECTED Final   Giardia lamblia NOT DETECTED NOT DETECTED Final   Adenovirus F40/41 NOT DETECTED NOT DETECTED Final   Astrovirus NOT DETECTED NOT DETECTED Final   Norovirus GI/GII NOT DETECTED NOT DETECTED Final   Rotavirus A NOT DETECTED NOT DETECTED Final   Sapovirus (I, II, IV, and V) NOT DETECTED NOT DETECTED Final    Comment: Performed at [facility], [address] [place], [place] [address]  C Difficile Quick Screen w PCR reflex     Status: Abnormal   Collection Time: 01/30/23  4:34 AM   Specimen: Stool  Result Value Ref Range Status   C Diff antigen POSITIVE (A) NEGATIVE Final   C Diff toxin NEGATIVE NEGATIVE Final   C Diff interpretation Results are indeterminate. See PCR results.  Final    Comment: Performed at [facility], [address] [place], [place] [address]  C. Diff  by PCR, Reflexed     Status: Abnormal   Collection Time: 01/30/23  4:34 AM  Result Value Ref Range Status   Toxigenic C. Difficile by PCR POSITIVE (A) NEGATIVE Final    Comment: Positive for toxigenic C. difficile with little to no toxin production. Only treat if clinical presentation suggests symptomatic illness. Performed at [facility], [address], [place], [place] [address]   Culture, blood (Routine X 2) w Reflex to ID Panel     Status: None   Collection Time: 02/12/23 12:57 AM   Specimen: BLOOD LEFT HAND  Result Value Ref Range Status   Specimen Description BLOOD LEFT HAND  Final   Special Requests   Final    BOTTLES DRAWN AEROBIC AND ANAEROBIC Blood Culture adequate volume   Culture   Final    NO GROWTH 5 DAYS Performed at [facility], [address], [place], [place] [address]    Report Status 02/17/2023 FINAL  Final  Culture, blood (Routine X 2) w Reflex to ID Panel     Status: Abnormal   Collection Time: 02/12/23 12:59 AM   Specimen: BLOOD LEFT ARM  Result Value Ref Range Status   Specimen Description BLOOD LEFT ARM  Final   Special Requests   Final    BOTTLES DRAWN AEROBIC AND ANAEROBIC Blood Culture adequate volume   Culture  Setup Time   Final    YEAST AEROBIC BOTTLE ONLY CRITICAL RESULT CALLED TO, READ BACK BY AND VERIFIED WITH: V BRYK,[id number]  02/16/23  [facility] Performed at [facility], [address] [place], [place] [address]    Culture CANDIDA GLABRATA (A)  Final   Report Status 02/18/2023 FINAL  Final  Blood Culture ID Panel (Reflexed)     Status: Abnormal   Collection Time: 02/12/23 12:59 AM  Result Value Ref Range Status   Enterococcus faecalis NOT DETECTED NOT DETECTED Final   Enterococcus Faecium NOT DETECTED NOT DETECTED Final   Listeria monocytogenes NOT DETECTED NOT DETECTED Final   Staphylococcus species NOT DETECTED NOT DETECTED Final   Staphylococcus aureus (BCID) NOT DETECTED NOT DETECTED Final   Staphylococcus epidermidis  NOT DETECTED NOT DETECTED Final   Staphylococcus lugdunensis NOT DETECTED NOT DETECTED Final   Streptococcus species NOT DETECTED NOT DETECTED Final   Streptococcus agalactiae NOT DETECTED NOT DETECTED Final   Streptococcus pneumoniae NOT DETECTED NOT DETECTED Final   Streptococcus pyogenes NOT DETECTED NOT DETECTED Final   A.calcoaceticus-baumannii NOT DETECTED NOT DETECTED Final   Bacteroides fragilis NOT DETECTED NOT DETECTED Final   Enterobacterales NOT DETECTED NOT DETECTED Final   Enterobacter cloacae complex NOT DETECTED NOT DETECTED Final   Escherichia coli NOT DETECTED NOT DETECTED Final   Klebsiella aerogenes NOT DETECTED NOT DETECTED Final   Klebsiella oxytoca NOT DETECTED NOT DETECTED Final   Klebsiella pneumoniae NOT DETECTED NOT DETECTED Final   Proteus species NOT DETECTED NOT DETECTED Final   Salmonella species NOT DETECTED NOT DETECTED Final   Serratia marcescens NOT DETECTED NOT DETECTED Final   Haemophilus influenzae NOT DETECTED NOT DETECTED Final   Neisseria meningitidis NOT DETECTED NOT DETECTED Final   Pseudomonas aeruginosa NOT DETECTED NOT DETECTED Final   Stenotrophomonas maltophilia NOT DETECTED NOT DETECTED Final   Candida albicans NOT DETECTED NOT DETECTED Final   Candida auris NOT DETECTED NOT DETECTED Final   Candida glabrata DETECTED (A) NOT DETECTED Final    Comment: CRITICAL RESULT CALLED TO, READ BACK BY AND VERIFIED WITH: V BRYK,[id number]  02/16/23 MK    Candida krusei NOT DETECTED NOT DETECTED Final   Candida parapsilosis NOT DETECTED NOT DETECTED Final   Candida tropicalis NOT DETECTED NOT DETECTED Final   Cryptococcus neoformans/gattii NOT DETECTED NOT DETECTED Final    Comment: Performed at [facility], [address] [place], [place] [address]  MRSA Next Gen by PCR, Nasal     Status: None   Collection Time: 02/13/23  8:12 AM   Specimen: Nasal Mucosa; Nasal Swab  Result Value Ref Range Status   MRSA by PCR Next Gen NOT DETECTED NOT  DETECTED Final    Comment: (NOTE) The GeneXpert MRSA Assay (FDA approved for NASAL specimens only), is one component of a comprehensive MRSA colonization surveillance program. It is not intended to diagnose MRSA infection nor to guide or monitor treatment for MRSA infections. Test performance is not FDA approved in patients less than 58 years old. Performed at [facility], [address], [place], [place] [address]   Culture, blood (Routine X 2) w Reflex to ID Panel     Status: None (Preliminary result)   Collection Time: 02/17/23 12:42 PM   Specimen: BLOOD LEFT HAND  Result Value Ref Range Status   Specimen Description BLOOD LEFT HAND  Final   Special Requests   Final    BOTTLES DRAWN AEROBIC ONLY Blood Culture adequate volume   Culture   Final    NO GROWTH < 24 HOURS Performed at [facility], [address], [place], [place] [address]  Report Status PENDING  Incomplete  Culture, blood (Routine X 2) w Reflex to ID Panel     Status: None (Preliminary result)   Collection Time: 02/17/23 12:42 PM   Specimen: BLOOD LEFT HAND  Result Value Ref Range Status   Specimen Description BLOOD LEFT HAND  Final   Special Requests   Final    BOTTLES DRAWN AEROBIC ONLY Blood Culture adequate volume   Culture   Final    NO GROWTH < 24 HOURS Performed at [facility], [address] [place], [place] [address]    Report Status PENDING  Incomplete    Studies/Results: ECHO TEE  Result Date: 02/18/2023    TRANSESOPHOGEAL ECHO REPORT   Patient Name:   [name] Date of Exam: 02/18/2023 Medical Rec #:  [id number]     Height:       63.0 in Accession #:    [id number]    Weight:       146.8 lb Date of Birth:  29-Apr-1971     BSA:          1.696 m Patient Age:    51 years      BP:           140/61 mmHg Patient Gender: F             HR:           111 bpm. Exam Location:  Inpatient Procedure: Transesophageal Echo, Cardiac Doppler, Color Doppler and Saline            Contrast Bubble  Study Indications:     Candidemia. Splenic infarct.  History:         Patient has prior history of Echocardiogram examinations, most                  recent 02/07/2023. Sepsis and COPD, Arrythmias:Cardiac Arrest;                  Risk Factors:Current Smoker.  Sonographer:     Delcie Roch RDCS Referring Phys:  [id number] Cyndi Bender Diagnosing Phys: Lennie Odor MD PROCEDURE: After discussion of the risks and benefits of a TEE, an informed consent was obtained from the patient. TEE procedure time was 10 minutes. The transesophogeal probe was passed without difficulty through the esophogus of the patient. Imaged were obtained with the patient in a left lateral decubitus position. Sedation performed by different physician. The patient was monitored while under deep sedation. Anesthestetic sedation was provided intravenously by Anesthesiology: 114mg  of Propofol. Image quality was excellent. The patient's vital signs; including heart rate, blood pressure, and oxygen saturation; remained stable throughout the procedure. The patient developed no complications during the procedure.  IMPRESSIONS  1. Negative TEE for endocarditis.  2. Left ventricular ejection fraction, by estimation, is 60 to 65%. The left ventricle has normal function.  3. Right ventricular systolic function is normal. The right ventricular size is normal.  4. No left atrial/left atrial appendage thrombus was detected.  5. Moderate pericardial effusion. The pericardial effusion is anterior to the right ventricle.  6. The mitral valve is grossly normal. Trivial mitral valve regurgitation. No evidence of mitral stenosis.  7. The aortic valve is tricuspid. Aortic valve regurgitation is not visualized. No aortic stenosis is present.  8. Agitated saline contrast bubble study was negative, with no evidence of any interatrial shunt. Conclusion(s)/Recommendation(s): No evidence of vegetation/infective endocarditis on this transesophageael echocardiogram. FINDINGS   Left Ventricle: Left ventricular ejection fraction, by estimation, is  60 to 65%. The left ventricle has normal function. The left ventricular internal cavity size was normal in size. Right Ventricle: The right ventricular size is normal. No increase in right ventricular wall thickness. Right ventricular systolic function is normal. Left Atrium: Left atrial size was normal in size. No left atrial/left atrial appendage thrombus was detected. Right Atrium: Right atrial size was normal in size. Pericardium: A moderately sized pericardial effusion is present. The pericardial effusion is anterior to the right ventricle. Mitral Valve: The mitral valve is grossly normal. Trivial mitral valve regurgitation. No evidence of mitral valve stenosis. There is no evidence of mitral valve vegetation. Tricuspid Valve: The tricuspid valve is grossly normal. Tricuspid valve regurgitation is mild . No evidence of tricuspid stenosis. Aortic Valve: The aortic valve is tricuspid. Aortic valve regurgitation is not visualized. No aortic stenosis is present. There is no evidence of aortic valve vegetation. Pulmonic Valve: The pulmonic valve was grossly normal. Pulmonic valve regurgitation is trivial. No evidence of pulmonic stenosis. There is no evidence of pulmonic valve vegetation. Aorta: The aortic root, ascending aorta, aortic arch and descending aorta are all structurally normal, with no evidence of dilitation or obstruction. Venous: The left upper pulmonary vein, left lower pulmonary vein, right lower pulmonary vein and right upper pulmonary vein are normal. IAS/Shunts: No atrial level shunt detected by color flow Doppler. Agitated saline contrast was given intravenously to evaluate for intracardiac shunting. Agitated saline contrast bubble study was negative, with no evidence of any interatrial shunt.   AORTA Ao Root diam: 3.39 cm Ao Asc diam:  2.92 cm Lennie Odor MD Electronically signed by Lennie Odor MD Signature Date/Time:  02/18/2023/12:56:06 PM    Final    EP STUDY  Result Date: 02/18/2023 See surgical note for result.     Assessment/Plan:  INTERVAL HISTORY:   TEE negative    Principal Problem:   Fungemia Active Problems:   Shock (HCC)   Metabolic encephalopathy   Cardiac arrest (HCC)   Acute respiratory failure with hypoxia (HCC)   Upper GI bleed   Granulomatous colitis (HCC)   Pseudomembranous colitis   Crohn's disease without complication (HCC)   Ileus (HCC)   C. difficile diarrhea   Sepsis (HCC)   Pressure injury of skin   Hyponatremia   Severe protein-calorie malnutrition (HCC)   ABLA (acute blood loss anemia)    Waynette Laredo is a 52 y.o. female with history of refractory Crohn's disease who then was admitted with what turned out to be fulminant C. 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  Blood cultures taken that day have now grown Candida blood in 1 of 2 sites sampled.  Vancomycin started for possible C difficile ileitis  #1 Candidemia:  The infarcts in the spleen and the emboli in the lungs have made me concerned that the patient could have left and right sided infectious endocarditis and more worrisome potentially fungal endocarditis.  Fortunately the TEE does not show endocarditis though I have some anxiety that she could have thrown vegetations off the valve and into lungs and spleen and that they are not longer visible  Fortunately the repeat blood cultures from 17th are clearing.  I think she can have a midline or PICC placed by Sunday and then would get the CTA we talked about  I will also order dopplers of UE and LE  to look for DVT  On Monday I would recommend getting in touch with the neurologist see if they be willing to do a dilated funduscopic exam to exclude fungal endophthalmitis  #2  Intrabdominal infection: CT scan is reassuring that there are no residual abscesses in the abdomen there are similar locules of fluid and air but they do not sound like abscesses he does have some purulence apparently still in the wound so I think for now would be reasonable to keep him on antibacterial antibiotics that would like to get rid of them as soon as possible since it would help with his possible C. difficile ileitis   #3 C difficile ileitis: diarrhea complicated by the fact that the patient has known inflammatory bowel disease is on tube feeds and antibiotics.  Again as above being able to get her off antibacterial antibiotics will be helpful if this is being driven largely by C. Difficile   I have personally spent 54 minutes involved in face-to-face and non-face-to-face activities for this patient on the day of the visit. Professional time spent includes the following activities: Preparing to see the patient (review of tests), Obtaining and/or reviewing separately obtained history (admission/discharge record), Performing a medically appropriate examination and/or evaluation , Ordering medications/tests/procedures, referring and communicating with other health care professionals, Documenting clinical information in the EMR, Independently interpreting results (not separately reported), Communicating results to the patient/family/caregiver, Counseling and educating the patient/family/caregiver and Care coordination (not separately reported).   My partner Dr. Elinor Parkinson is here this weekend for questions and will followup the blood cultures.  Dr. Thedore Mins will take over the service on the weekend.   LOS: 22 days   Acey Lav 02/18/2023, 4:18 PM

## 2023-02-18 NOTE — Progress Notes (Signed)
Patient back from procedure.

## 2023-02-18 NOTE — Progress Notes (Signed)
PHARMACY - ANTICOAGULATION CONSULT NOTE  Pharmacy Consult for heparin Indication: pulmonary embolus (no boluses per surgery due to recent colectomy)  Allergies  Allergen Reactions   Codeine Shortness Of Breath, Swelling and Rash    Throat swelling   Dilaudid [Hydromorphone Hcl] Shortness Of Breath and Swelling   Hydrocodone Shortness Of Breath, Swelling and Rash   Ketorolac Nausea And Vomiting and Swelling    Pt reports n/v and swelling to throat   Morphine And Codeine Shortness Of Breath, Swelling and Rash   Oxycodone Shortness Of Breath, Swelling and Rash    Patient states ALL pain medications make her deathly sick.   Penicillins Shortness Of Breath, Swelling and Other (See Comments)    Throat swells 02/06/21--TOLERATES CEFTRIAXONE     Nickel Rash   Pregabalin Other (See Comments)   Acetaminophen     Per MD patient states she can't take Tylenol because of liver enzymes   Ativan [Lorazepam] Other (See Comments)    Migraines.   Strawberry Extract Swelling   Watermelon Concentrate Swelling   Albuterol Palpitations   Benadryl [Diphenhydramine Hcl] Palpitations   Humira [Adalimumab] Rash   Latex Rash   Remicade [Infliximab] Rash    Blisters and Welts    Tramadol Rash    Rash and itching    Patient Measurements: Height: 5' 2.99" (160 cm) Weight: 66.6 kg (146 lb 13.2 oz) IBW/kg (Calculated) : 52.38 Heparin Dosing Weight: 68.8  Vital Signs: Temp: 99.7 F (37.6 C) (10/18 0333) Temp Source: Axillary (10/18 0333) BP: 142/66 (10/18 0333) Pulse Rate: 118 (10/18 0333)  Labs: Recent Labs    02/15/23 1206 02/16/23 0413 02/16/23 0413 02/17/23 0321 02/17/23 1242 02/17/23 2148 02/18/23 0539  HGB  --  9.4*   < > 9.3*  --   --  9.8*  HCT  --  28.6*  --  29.4*  --   --  30.8*  PLT  --  609*  --  672*  --   --  556*  HEPARINUNFRC  --   --    < > <0.10* <0.10* <0.10* 0.45  CREATININE 0.55 0.48  --  0.37*  --   --   --    < > = values in this interval not displayed.     Estimated Creatinine Clearance: 74.6 mL/min (A) (by C-G formula based on SCr of 0.37 mg/dL (L)).   Assessment: Patient with PMH of COPD, HTN, Crohn's, DM, GERD, asthma, anxiety, head injury, IBS, PTSD, PSVT, and seizure admitted on 9/26 after being found down at home with bloody emesis. She underwent CRP x 5 min with ROSC. She underwent a flexible sigmoidoscopy-she was found to have changes consistent with pseudomembranous colitis. 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 On CT AP 10/16 there was noted splenic infarcts and pulm emboli. Pharmacy consulted on 10/16 to dose heparin without boluses due to recent colectomy.   Heparin level is therapeutic at 0.45, H/H stable.  Goal of Therapy:  Heparin level 0.3-0.7 units/ml Monitor platelets by anticoagulation protocol: Yes   Plan:  Continue heparin 1900 units/h Daily heparin level and CBC  Fredonia Highland, PharmD, BCPS, [facility] Clinical Pharmacist Please check AMION for all [facility] numbers 02/18/2023

## 2023-02-18 NOTE — Anesthesia Postprocedure Evaluation (Signed)
Anesthesia Post Note  Patient: [name]  Procedure(s) Performed: TRANSESOPHAGEAL ECHOCARDIOGRAM     Anesthesia Type: MAC Anesthetic complications: no   No notable events documented.  Last Vitals:  Vitals:   02/18/23 1228 02/18/23 1240  BP: (!) 138/100 (!) 133/50  Pulse: (!) 112 (!) 112  Resp: 19 16  Temp: 36.7 C   SpO2: 94% 94%    Last Pain:  Vitals:   02/18/23 1228  TempSrc: Temporal  PainSc: 3                  [place]

## 2023-02-18 NOTE — Progress Notes (Signed)
SLP Cancellation Note  Patient Details Name: [name] MRN: [id number] DOB: 04-Nov-1970   Cancelled treatment:        Pt out of room and NPO for TEE. Will follow along.   Royce Macadamia 02/18/2023, 11:53 AM

## 2023-02-18 NOTE — Progress Notes (Signed)
PT Cancellation Note  Patient Details Name: [name] MRN: [id number] DOB: 1970/12/19   Cancelled Treatment:    Reason Eval/Treat Not Completed: (P) Patient declined, no reason specified (Pt reports fatigue after procedure this morning and declines mobility at this time. Will continue to follow per PT POC.)   [name] 02/18/2023, 2:39 PM

## 2023-02-18 NOTE — Interval H&P Note (Signed)
History and Physical Interval Note:  02/18/2023 11:50 AM  [name]  has presented today for surgery, with the diagnosis of candidemia, splenic infarct.  The various methods of treatment have been discussed with the patient and family. After consideration of risks, benefits and other options for treatment, the patient has consented to  Procedure(s): TRANSESOPHAGEAL ECHOCARDIOGRAM (N/A) as a surgical intervention.  The patient's history has been reviewed, patient examined, no change in status, stable for surgery.  I have reviewed the patient's chart and labs.  Questions were answered to the patient's satisfaction.    NPO for TEE for bacteremia. Discussed with [facility] doctor by phone who provided consent. No contraindications on review of records.   Gerri Spore T. Flora Lipps, MD, [facility]  [facility]  [address] [place], [place] [address] [phone]  11:51 AM

## 2023-02-18 NOTE — Progress Notes (Signed)
PROGRESS NOTE    [name]  MVH:[id number] DOB: Feb 01, 1971 DOA: 01/27/2023 PCP: [name], PA-C   Brief Narrative:  This is a 52 year old female with past medical history of COPD, Crohn's on Skyrizi, hypertension, type 2 diabetes mellitus who initially presented to the after being found unresponsive. Per family, patient has had bloody emesis with diarrhea over the past few days. Initial systolics in the 60s in the ED, and patient was minimally responsive. In the emergency room, patient was intubated, and patient was admitted to the ICU for further evaluation and management for shock and altered mental status.    01/27/2023: Admitted to ICU and intubated.  Felt to have septic shock of unclear source which resolved antibiotic therapy and fluid resuscitation.  Suspicion was that she may have had C. difficile colitis despite negative toxin.  She was treated with vancomycin. 9/29 Off pressors 10/2: Extubated 10/3 transfer to floor. 10/10 underwent repeat proctosigmoidoscopy for ongoing diarrhea.  Diffuse severe inflammation identified due to combination of Crohn's disease, active C. difficile infection and possible ischemic colitis from prior hypotension on presentation. 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  Intraoperative findings include frank necrosis with pelvic abscess formation and significant colitis. 10/12 some dilated small bowel with minimal OG output.  Continued observation advised.  Having significant postoperative pain. 10/13 became tachycardic and hypotensive.  More lethargic.  Transferred back to ICU. 10/15 blood cultures reports fungemia, resume PO vancomycin 10/17 CT abdomen pelvis notable for septic emboli - CTA chest on hold given IV access issues 10/18 TEE planned today - NG out for TEE probe.  Advance diet, hoping to avoid replacement of NG tube  Assessment & Plan:   Principal Problem:   Cardiac arrest [facility]) Active Problems:   Metabolic  encephalopathy   Shock (HCC)   Acute respiratory failure with hypoxia (HCC)   Upper GI bleed   Granulomatous colitis (HCC)   Pseudomembranous colitis   Crohn's disease without complication (HCC)   Ileus (HCC)   C. difficile diarrhea   Sepsis (HCC)   Pressure injury of skin   Hyponatremia   Severe protein-calorie malnutrition (HCC)   ABLA (acute blood loss anemia)   Septic shock, multifactorial, POA -Cultures not reporting fungemia, fulminant C. difficile colitis and intra-abdominal abscess also notable sources for infection -ID following, appreciate insight recommendations - continue micafungin, vancomycin, ceftriaxone, Flagyl -Status post colectomy -CT abdomen pelvis shows splenic and pulmonary emboli -CT chest on hold due to poor IV access -TEE this afternoon   Infectious colitis due to C. difficile on treatment -See above -High output ostomy, continue to supplement p.o. and IV hydration as appropriate  Longstanding Crohn's disease; not chronically on steroids so less concern for adrenal insufficiency -Hold steroids, Skyrizi in the setting of ongoing infection   Type 2 diabetes -SSI PRN, has not needed much insulin currently -Goal BG 140-180   History of seizures -Continue PTA keppra -Resume PTA gabapentin   History of anxiety -Continue ativan 2mg  PRN -likely can soon resume PTA anxiety meds once PO intake improves (Cymbalta, atarax)   History of hypertension -Hold home antihypertensives given infection/sepsis and prior hypotension. Resume as appropriate.   History of COPD -con't xopenex PRN -switch PTA spiriva to yupelri for now   GERD -Continue PTA PPI   Anemia  -Transfuse for Hb <7 or hemodynamically significant bleeding   Hypokalemia/hypomagnesemia -repleted, monitor  At high risk for malnutrition. -Advance diet per surgery -NG tube out for TEE, follow I's and O's, calorie count, may be  able to avoid replacement of NG tube -adding thiamine, folic  acid given chronic risk for malabsorption with crohn's and C diff.   Cardiac arrest secondary to septic shock requiring CPR on admission Echocardiogram on admit within normal limits  Crohn's disease - well controlled on Skyrizi Unclear if this plays a role in her overall condition Crohn's colitis/ischemic colitis/C. difficile all playing a role and not a candidate at this time for [facility] or any other immunosuppressants   Pericardial effusion without tamponade Will need 6 to 8-week follow-up with Dr. Diona Browner in the outpatient setting for repeat echo   Seizure disorder Continue Keppra  Anxiety disorder-continue Ativan as needed   Non-insulin-dependent diabetes type 2, well-controlled  -A1c 5.8 -advance diet as tolerated, continue hypoglycemic protocol and sliding scale insulin   DVT prophylaxis: SCDs Start: 01/27/23 1111 he parin gtt Code Status:   Code Status: Full Code Family Communication: None present  Status is: Inpatient  Dispo: The patient is from: Home              Anticipated d/c is to: TBD              Anticipated d/c date is: To be determined              Patient currently not medically stable for discharge  Consultants:  PCCM, ID, general surgery  Procedures:  Colectomy with ostomy placement  Antimicrobials:  Ceftriaxone, Flagyl, micafungin, PO vancomycin  Subjective: No acute issues or events overnight, patient reports poor sleep but otherwise denies nausea vomiting diarrhea constipation headache fevers chills or chest pain  Objective: Vitals:   02/18/23 0003 02/18/23 0004 02/18/23 0333 02/18/23 0623  BP: (!) 130/47  (!) 142/66   Pulse: (!) 123  (!) 118 (!) 113  Resp: 19  16 16   Temp: 98.4 F (36.9 C) 98.4 F (36.9 C) 99.7 F (37.6 C)   TempSrc: Axillary  Axillary   SpO2: 94%  93% 93%  Weight:   66.6 kg   Height:        Intake/Output Summary (Last 24 hours) at 02/18/2023 0750 Last data filed at 02/18/2023 0622 Gross per 24 hour  Intake 2388.92  ml  Output 4035 ml  Net -1646.08 ml   Filed Weights   02/15/23 0616 02/16/23 0107 02/18/23 0333  Weight: 64 kg 68.8 kg 66.6 kg    Examination:  General exam: Appears calm and comfortable  Respiratory system: Clear to auscultation. Respiratory effort normal. Cardiovascular system: S1 & S2 heard, RRR. No JVD, murmurs, rubs, gallops or clicks. No pedal edema. Gastrointestinal system: midline incision/wound vac appear stable/no leaks; ostomy clean/dry putting out liquid brown stool Central nervous system: Alert and oriented. No focal neurological deficits. Extremities: Symmetric 5 x 5 power. Skin: No rashes, lesions or ulcers Psychiatry: Judgement and insight appear normal. Mood & affect appropriate.   Data Reviewed: I have personally reviewed following labs and imaging studies  CBC: Recent Labs  Lab 02/14/23 0244 02/15/23 0246 02/16/23 0413 02/17/23 0321 02/18/23 0539  WBC 35.0* 38.9* 40.8* 44.4* 39.9*  HGB 10.1* 10.1* 9.4* 9.3* 9.8*  HCT 31.5* 30.4* 28.6* 29.4* 30.8*  MCV 85.6 84.0 86.1 87.0 87.0  PLT 611* 618* 609* 672* 556*   Basic Metabolic Panel: Recent Labs  Lab 02/15/23 0246 02/15/23 1206 02/16/23 0413 02/17/23 0321 02/18/23 0539  NA 135 133* 138 136 134*  K 3.3* 4.0 3.8 3.3* 4.4  CL 101 103 101 97* 99  CO2 23 22 25 24 22   GLUCOSE  115* 153* 109* 134* 111*  BUN <5* <5* <5* 5* <5*  CREATININE 0.48 0.55 0.48 0.37* 0.45  CALCIUM 8.5* 8.1* 8.9 9.0 9.1  MG 1.6*  --  2.0 1.6* 1.9   GFR: Estimated Creatinine Clearance: 74.6 mL/min (by C-G formula based on SCr of 0.45 mg/dL).  Liver Function Tests: Recent Labs  Lab 02/14/23 0244 02/15/23 0246 02/16/23 0413 02/17/23 0321 02/18/23 0539  AST 14* 13* 14* 14* 14*  ALT 10 10 8 9 7   ALKPHOS 60 83 135* 132* 139*  BILITOT 0.3 0.4 0.5 0.4 0.5  PROT 4.9* 5.1* 5.3* 5.3* 5.6*  ALBUMIN 2.6* 2.4* 2.9* 2.6* 2.5*   CBG: Recent Labs  Lab 02/17/23 1230 02/17/23 1649 02/17/23 2051 02/18/23 0001 02/18/23 0400   GLUCAP 131* 108* 122* 113* 116*   Sepsis Labs: Recent Labs  Lab 02/12/23 0057 02/13/23 0913  LATICACIDVEN 1.9 1.2    Recent Results (from the past 240 hour(s))  Culture, blood (Routine X 2) w Reflex to ID Panel     Status: None   Collection Time: 02/12/23 12:57 AM   Specimen: BLOOD LEFT HAND  Result Value Ref Range Status   Specimen Description BLOOD LEFT HAND  Final   Special Requests   Final    BOTTLES DRAWN AEROBIC AND ANAEROBIC Blood Culture adequate volume   Culture   Final    NO GROWTH 5 DAYS Performed at [facility], [address], [place], [place] [address]    Report Status 02/17/2023 FINAL  Final  Culture, blood (Routine X 2) w Reflex to ID Panel     Status: None (Preliminary result)   Collection Time: 02/12/23 12:59 AM   Specimen: BLOOD LEFT ARM  Result Value Ref Range Status   Specimen Description BLOOD LEFT ARM  Final   Special Requests   Final    BOTTLES DRAWN AEROBIC AND ANAEROBIC Blood Culture adequate volume   Culture  Setup Time   Final    YEAST AEROBIC BOTTLE ONLY CRITICAL RESULT CALLED TO, READ BACK BY AND VERIFIED WITH: V BRYK,[id number]  02/16/23 MK    Culture   Final    YEAST Performed at [facility], [address] [place], [place] [address]    Report Status PENDING  Incomplete  Blood Culture ID Panel (Reflexed)     Status: Abnormal   Collection Time: 02/12/23 12:59 AM  Result Value Ref Range Status   Enterococcus faecalis NOT DETECTED NOT DETECTED Final   Enterococcus Faecium NOT DETECTED NOT DETECTED Final   Listeria monocytogenes NOT DETECTED NOT DETECTED Final   Staphylococcus species NOT DETECTED NOT DETECTED Final   Staphylococcus aureus (BCID) NOT DETECTED NOT DETECTED Final   Staphylococcus epidermidis NOT DETECTED NOT DETECTED Final   Staphylococcus lugdunensis NOT DETECTED NOT DETECTED Final   Streptococcus species NOT DETECTED NOT DETECTED Final   Streptococcus agalactiae NOT DETECTED NOT DETECTED Final    Streptococcus pneumoniae NOT DETECTED NOT DETECTED Final   Streptococcus pyogenes NOT DETECTED NOT DETECTED Final   A.calcoaceticus-baumannii NOT DETECTED NOT DETECTED Final   Bacteroides fragilis NOT DETECTED NOT DETECTED Final   Enterobacterales NOT DETECTED NOT DETECTED Final   Enterobacter cloacae complex NOT DETECTED NOT DETECTED Final   Escherichia coli NOT DETECTED NOT DETECTED Final   Klebsiella aerogenes NOT DETECTED NOT DETECTED Final   Klebsiella oxytoca NOT DETECTED NOT DETECTED Final   Klebsiella pneumoniae NOT DETECTED NOT DETECTED Final   Proteus species NOT DETECTED NOT DETECTED Final   Salmonella species  NOT DETECTED NOT DETECTED Final   Serratia marcescens NOT DETECTED NOT DETECTED Final   Haemophilus influenzae NOT DETECTED NOT DETECTED Final   Neisseria meningitidis NOT DETECTED NOT DETECTED Final   Pseudomonas aeruginosa NOT DETECTED NOT DETECTED Final   Stenotrophomonas maltophilia NOT DETECTED NOT DETECTED Final   Candida albicans NOT DETECTED NOT DETECTED Final   Candida auris NOT DETECTED NOT DETECTED Final   Candida glabrata DETECTED (A) NOT DETECTED Final    Comment: CRITICAL RESULT CALLED TO, READ BACK BY AND VERIFIED WITH: V BRYK,[id number]  02/16/23 MK    Candida krusei NOT DETECTED NOT DETECTED Final   Candida parapsilosis NOT DETECTED NOT DETECTED Final   Candida tropicalis NOT DETECTED NOT DETECTED Final   Cryptococcus neoformans/gattii NOT DETECTED NOT DETECTED Final    Comment: Performed at [facility], [address], [place], [place] [address]  MRSA Next Gen by PCR, Nasal     Status: None   Collection Time: 02/13/23  8:12 AM   Specimen: Nasal Mucosa; Nasal Swab  Result Value Ref Range Status   MRSA by PCR Next Gen NOT DETECTED NOT DETECTED Final    Comment: (NOTE) The GeneXpert MRSA Assay (FDA approved for NASAL specimens only), is one component of a comprehensive MRSA colonization surveillance program. It is not intended to diagnose  MRSA infection nor to guide or monitor treatment for MRSA infections. Test performance is not FDA approved in patients less than 34 years old. Performed at [facility], [address] [place], [place] [address]          Radiology Studies: CT ABDOMEN PELVIS W CONTRAST  Result Date: 02/16/2023 CLINICAL DATA:  Abdominal pain, postop EXAM: CT ABDOMEN AND PELVIS WITH CONTRAST TECHNIQUE: Multidetector CT imaging of the abdomen and pelvis was performed using the standard protocol following bolus administration of intravenous contrast. RADIATION DOSE REDUCTION: This exam was performed according to the departmental dose-optimization program which includes automated exposure control, adjustment of the mA and/or kV according to patient size and/or use of iterative reconstruction technique. CONTRAST:  75mL OMNIPAQUE IOHEXOL 350 MG/ML SOLN COMPARISON:  01/31/2023 FINDINGS: Lower chest: Filling defects seen within right lower lobe pulmonary arteries compatible with incidental pulmonary emboli. Small left pleural effusion with compressive atelectasis in the left lower lobe. Dependent atelectasis in the right lower lobe. Hepatobiliary: No focal liver abnormality is seen. Status post cholecystectomy. No biliary dilatation. Pancreas: No focal abnormality or ductal dilatation. Spleen: Linear and wedge-shaped hypodensities throughout the spleen most compatible with splenic infarcts. This is similar to prior study. Adrenals/Urinary Tract: No adrenal abnormality. No focal renal abnormality. No stones or hydronephrosis. Urinary bladder is unremarkable. Stomach/Bowel: Interval total colectomy. Feeding tube in place with the tip in the proximal duodenum. Small bowel decompressed. Right lower quadrant ostomy noted. There is edema and fluid throughout the small bowel mesentery with surgical drains in place. Vascular/Lymphatic: No evidence of aneurysm or adenopathy. Reproductive: Prior hysterectomy.  No adnexal masses.  Other: There is edema/fluid throughout the small bowel mesentery, likely postoperative. Few locules of extraluminal gas also noted, also presumably from recent surgery. Surgical drain in place in the mid and left pelvis. Musculoskeletal: No acute bony abnormality. IMPRESSION: Incidental small pulmonary emboli seen in the right lower lobe pulmonary arterial branches. Interval colectomy. Right lower quadrant ostomy noted. No evidence of bowel obstruction. Edema/fluid throughout the mesentery, presumably postoperative. Few locules of free air in the abdomen, also likely postoperative. Small left pleural effusion with compressive atelectasis in the left lower  lobe. 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 These are similar to prior study. These results will be called to the ordering clinician or representative by the Radiologist Assistant, and communication documented in the PACS or [employer]. Electronically Signed   By: Charlett Nose M.D.   On: 02/16/2023 19:37   [place] EKG SITE RITE  Result Date: 02/16/2023 If Site Rite image not attached, placement could not be confirmed due to current cardiac rhythm.       Scheduled Meds:  acetaminophen (TYLENOL) oral liquid 160 mg/5 mL  1,000 mg Per Tube Q6H   amitriptyline  25 mg Oral QHS   Chlorhexidine Gluconate Cloth  6 each Topical Daily   DULoxetine  30 mg Oral Daily   fentaNYL  1 patch Transdermal Q72H   ferrous sulfate  325 mg Oral BID WC   fiber supplement (BANATROL TF)  60 mL Per Tube QID   folic acid  1 mg Intravenous Daily   gabapentin  100 mg Oral Q8H   Gerhardt's butt cream   Topical BID   insulin aspart  0-9 Units Subcutaneous Q4H   lidocaine  2 patch Transdermal Q24H   loperamide  2 mg Oral QID   methocarbamol  1,000 mg Per Tube TID   multivitamin with minerals  1 tablet Oral Daily   mouth rinse  15 mL Mouth Rinse 4 times per day   pantoprazole (PROTONIX) IV  40 mg Intravenous Q24H    revefenacin  175 mcg Nebulization Daily   thiamine (VITAMIN B1) injection  100 mg Intravenous Daily   vancomycin  125 mg Per Tube QID   Vivonex RTF  1,000 mL Oral Q24H   Continuous Infusions:  cefTRIAXone (ROCEPHIN)  IV Stopped (02/17/23 2111)   heparin 1,900 Units/hr (02/18/23 0641)   levETIRAcetam Stopped (02/17/23 2145)   metronidazole Stopped (02/18/23 0737)   micafungin (MYCAMINE) 150 mg in sodium chloride 0.9 % 100 mL IVPB Stopped (02/18/23 0554)     LOS: 22 days   Time spent:  Azucena Fallen, DO [facility]  If 7PM-7AM, please contact night-coverage [email or web address]  02/18/2023, 7:50 AM

## 2023-02-18 NOTE — Anesthesia Postprocedure Evaluation (Signed)
Anesthesia Post Note  Patient: Rickia Harlacher  Procedure(s) Performed: TRANSESOPHAGEAL ECHOCARDIOGRAM     Patient location during evaluation: PACU Anesthesia Type: MAC Level of consciousness: awake and alert Pain management: pain level controlled Vital Signs Assessment: post-procedure vital signs reviewed and stable Respiratory status: spontaneous breathing, nonlabored ventilation, respiratory function stable and patient connected to nasal cannula oxygen Cardiovascular status: stable and blood pressure returned to baseline Postop Assessment: no apparent nausea or vomiting Anesthetic complications: no   No notable events documented.  Last Vitals:  Vitals:   02/18/23 1228 02/18/23 1240  BP: (!) 138/100 (!) 133/50  Pulse: (!) 112 (!) 112  Resp: 19 16  Temp: 36.7 C   SpO2: 94% 94%    Last Pain:  Vitals:   02/18/23 1228  TempSrc: Temporal  PainSc: 3                  [place]

## 2023-02-18 NOTE — CV Procedure (Signed)
TRANSESOPHAGEAL ECHOCARDIOGRAM   NAME:  [name]    MRN: [id number] DOB:  1971-04-10    ADMIT DATE: 01/27/2023  INDICATIONS: Candidemia  PROCEDURE:   Informed consent was obtained prior to the procedure. The risks, benefits and alternatives for the procedure were discussed and the patient comprehended these risks.  Risks include, but are not limited to, cough, sore throat, vomiting, nausea, somnolence, esophageal and stomach trauma or perforation, bleeding, low blood pressure, aspiration, pneumonia, infection, trauma to the teeth and death.    Procedural time out performed. The oropharynx was anesthetized with topical 1% benzocaine.    Anesthesia was administered by Dr. Charlynn Grimes.  The patient's heart rate, blood pressure, and oxygen saturation are monitored continuously during the procedure. The period of conscious sedation is 10 minutes, of which I was present face-to-face 100% of this time.   The transesophageal probe was inserted in the esophagus and stomach without difficulty and multiple views were obtained.   COMPLICATIONS:    There were no immediate complications.  KEY FINDINGS:  Negative for endocarditis.  Negative bubble study.  Full report to follow. Further management per primary team.   Gerri Spore T. Flora Lipps, MD, [facility]  [facility]  [address], [address] [place], [place] [address] [phone]  12:20 PM

## 2023-02-19 ENCOUNTER — Inpatient Hospital Stay (HOSPITAL_COMMUNITY): Payer: Medicaid Other

## 2023-02-19 DIAGNOSIS — Z86711 Personal history of pulmonary embolism: Secondary | ICD-10-CM | POA: Diagnosis not present

## 2023-02-19 DIAGNOSIS — K567 Ileus, unspecified: Secondary | ICD-10-CM | POA: Diagnosis not present

## 2023-02-19 DIAGNOSIS — K5010 Crohn's disease of large intestine without complications: Secondary | ICD-10-CM | POA: Diagnosis not present

## 2023-02-19 DIAGNOSIS — B49 Unspecified mycosis: Secondary | ICD-10-CM | POA: Diagnosis not present

## 2023-02-19 DIAGNOSIS — I2699 Other pulmonary embolism without acute cor pulmonale: Secondary | ICD-10-CM

## 2023-02-19 DIAGNOSIS — A0472 Enterocolitis due to Clostridium difficile, not specified as recurrent: Secondary | ICD-10-CM | POA: Diagnosis not present

## 2023-02-19 LAB — GLUCOSE, CAPILLARY
Glucose-Capillary: 108 mg/dL — ABNORMAL HIGH (ref 70–99)
Glucose-Capillary: 109 mg/dL — ABNORMAL HIGH (ref 70–99)
Glucose-Capillary: 114 mg/dL — ABNORMAL HIGH (ref 70–99)
Glucose-Capillary: 91 mg/dL (ref 70–99)
Glucose-Capillary: 95 mg/dL (ref 70–99)
Glucose-Capillary: 97 mg/dL (ref 70–99)

## 2023-02-19 LAB — HEPARIN LEVEL (UNFRACTIONATED): Heparin Unfractionated: 0.53 [IU]/mL (ref 0.30–0.70)

## 2023-02-19 LAB — COMPREHENSIVE METABOLIC PANEL
ALT: 10 U/L (ref 0–44)
AST: 15 U/L (ref 15–41)
Albumin: 2.7 g/dL — ABNORMAL LOW (ref 3.5–5.0)
Alkaline Phosphatase: 161 U/L — ABNORMAL HIGH (ref 38–126)
Anion gap: 15 (ref 5–15)
BUN: <5 mg/dL — ABNORMAL LOW (ref 6–20)
CO2: 24 mmol/L (ref 22–32)
Calcium: 9.6 mg/dL (ref 8.9–10.3)
Chloride: 95 mmol/L — ABNORMAL LOW (ref 98–111)
Creatinine, Ser: 0.51 mg/dL (ref 0.44–1.00)
GFR, Estimated: >60 mL/min (ref >60)
Glucose, Bld: 105 mg/dL — ABNORMAL HIGH (ref 70–99)
Potassium: 3.7 mmol/L (ref 3.5–5.1)
Sodium: 134 mmol/L — ABNORMAL LOW (ref 135–145)
Total Bilirubin: 0.5 mg/dL (ref 0.3–1.2)
Total Protein: 5.9 g/dL — ABNORMAL LOW (ref 6.5–8.1)

## 2023-02-19 LAB — CBC
HCT: 30.3 % — ABNORMAL LOW (ref 36.0–46.0)
Hemoglobin: 9.6 g/dL — ABNORMAL LOW (ref 12.0–15.0)
MCH: 27.1 pg (ref 26.0–34.0)
MCHC: 31.7 g/dL (ref 30.0–36.0)
MCV: 85.6 fL (ref 80.0–100.0)
Platelets: 1103 10*3/uL (ref 150–400)
RBC: 3.54 MIL/uL — ABNORMAL LOW (ref 3.87–5.11)
RDW: 15.9 % — ABNORMAL HIGH (ref 11.5–15.5)
WBC: 32.6 10*3/uL — ABNORMAL HIGH (ref 4.0–10.5)
nRBC: 0.2 % (ref 0.0–0.2)

## 2023-02-19 MED ORDER — OXYCODONE HCL 5 MG PO TABS
5.0000 mg | ORAL_TABLET | ORAL | Status: DC | PRN
  Administered 2023-02-19 – 2023-02-28 (×27): 10 mg via ORAL
  Administered 2023-02-28: 5 mg via ORAL
  Administered 2023-02-28 – 2023-03-04 (×16): 10 mg via ORAL
  Filled 2023-02-19 (×46): qty 2

## 2023-02-19 MED ORDER — FENTANYL CITRATE PF 50 MCG/ML IJ SOSY
25.0000 ug | PREFILLED_SYRINGE | Freq: Once | INTRAMUSCULAR | Status: AC
  Administered 2023-02-19: 25 ug via INTRAVENOUS

## 2023-02-19 MED ORDER — FENTANYL CITRATE PF 50 MCG/ML IJ SOSY: 25.0000 ug | PREFILLED_SYRINGE | Freq: Once | INTRAMUSCULAR | Status: DC

## 2023-02-19 MED ORDER — ORAL CARE MOUTH RINSE: 15.0000 mL | OROMUCOSAL | Status: DC | PRN

## 2023-02-19 NOTE — Progress Notes (Signed)
PROGRESS NOTE    [name]  [name]:[id number] DOB: 1970-12-28 DOA: 01/27/2023 PCP: [name], PA-C   Brief Narrative:  This is a 52 year old female with past medical history of COPD, Crohn's on Skyrizi, hypertension, type 2 diabetes mellitus who initially presented to the after being found unresponsive. Per family, patient has had bloody emesis with diarrhea over the past few days. Initial systolics in the 60s in the ED, and patient was minimally responsive. In the emergency room, patient was intubated, and patient was admitted to the ICU for further evaluation and management for shock and altered mental status.    01/27/2023: Admitted to ICU and intubated.  Felt to have septic shock of unclear source which resolved antibiotic therapy and fluid resuscitation.  Suspicion was that she may have had C. difficile colitis despite negative toxin.  She was treated with vancomycin. 9/29 Off pressors 10/2: Extubated 10/3 transfer to floor. 10/10 underwent repeat proctosigmoidoscopy for ongoing diarrhea.  Diffuse severe inflammation identified due to combination of Crohn's disease, active C. difficile infection and possible ischemic colitis from prior hypotension on presentation. 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  Intraoperative findings include frank necrosis with pelvic abscess formation and significant colitis. 10/12 some dilated small bowel with minimal OG output.  Continued observation advised.  Having significant postoperative pain. 10/13 became tachycardic and hypotensive.  More lethargic.  Transferred back to ICU. 10/15 blood cultures reports fungemia, resume PO vancomycin 10/17 CT abdomen pelvis notable for septic emboli - CTA chest on hold given IV access issues 10/18 TEE negative for endocarditis/bubble study - NG out for TEE probe.  Advance diet, hoping to avoid replacement of NG tube 10/19 wound VAC dysfunctional, surgery switching to wet-to-dry over the  weekend -pain poorly controlled today, add oxycodone (medication lists this is an allergy but patient verbally approved)   Assessment & Plan:   Principal Problem:   Fungemia Active Problems:   Metabolic encephalopathy   Shock (HCC)   Cardiac arrest (HCC)   Acute respiratory failure with hypoxia (HCC)   Upper GI bleed   Granulomatous colitis (HCC)   Pseudomembranous colitis   Crohn's disease without complication (HCC)   Ileus (HCC)   C. difficile diarrhea   Sepsis (HCC)   Pressure injury of skin   Hyponatremia   Severe protein-calorie malnutrition (HCC)   ABLA (acute blood loss anemia)   Pulmonary embolus (HCC)   Splenic infarct   Septic shock, multifactorial, POA -Cultures not reporting fungemia, fulminant C. difficile colitis and intra-abdominal abscess also notable sources for infection -ID following, appreciate insight recommendations - continue micafungin, vancomycin, ceftriaxone, Flagyl -Status post colectomy -CT abdomen pelvis shows splenic and pulmonary emboli -Likely okay to place PICC/central line on the 20th if cultures remain negative, this will allow CT chest with contrast to further evaluate for septic emboli -DVT studies of the upper and lower extremities reveal right IJ and brachial vein DVT, left brachial vein DVT also noted -lower extremity DVT studies negative -TEE 10/18 negative for vegetations, bubble study negative **ID recommending neurology consult Monday the 21st for possible dilated funduscopic exam to rule out fungal endophthalmitis   Infectious colitis due to C. difficile on treatment -See above -High output ostomy, continue to supplement p.o. and IV hydration as appropriate  Longstanding Crohn's disease; not chronically on steroids so less concern for adrenal insufficiency -Hold steroids, Skyrizi in the setting of ongoing infection   Type 2 diabetes -SSI PRN, has not needed much insulin currently -Goal BG 140-180   History  54098    Report Status PENDING  Incomplete         Radiology Studies: ECHO TEE  Result Date: 02/18/2023    TRANSESOPHOGEAL ECHO REPORT   Patient Name:   [name] Date of Exam: 02/18/2023 Medical Rec #:  [id number]     Height:       63.0 in Accession #:    [id number]    Weight:       146.8 lb Date of Birth:  [birth year]     BSA:          1.696 m Patient Age:    51 years      BP:           140/61 mmHg Patient Gender: F             HR:           111 bpm. Exam Location:  Inpatient Procedure: Transesophageal Echo, Cardiac Doppler, Color Doppler and Saline            Contrast Bubble Study Indications:     Candidemia. Splenic infarct.  History:         Patient has prior history of Echocardiogram examinations, most                  recent 02/07/2023. Sepsis and COPD, Arrythmias:Cardiac Arrest;                  Risk Factors:Current Smoker.  Sonographer:     Delcie Roch RDCS Referring Phys:  [id number] Cyndi Bender Diagnosing Phys: Lennie Odor MD PROCEDURE: After discussion of the risks and benefits of a TEE, an informed consent was obtained from the patient. TEE procedure time was 10 minutes. The transesophogeal probe was passed without difficulty through the esophogus of the patient. Imaged were obtained with the patient in a left lateral decubitus position. Sedation performed by different physician. The patient was monitored while under deep sedation. Anesthestetic sedation was provided intravenously by Anesthesiology: 114mg  of Propofol. Image quality was excellent. The patient's vital signs; including heart rate, blood pressure, and oxygen saturation; remained stable throughout the procedure. The patient developed no complications during the procedure.  IMPRESSIONS  1. Negative TEE for endocarditis.  2. Left ventricular ejection fraction, by estimation, is 60 to  65%. The left ventricle has normal function.  3. Right ventricular systolic function is normal. The right ventricular size is normal.  4. No left atrial/left atrial appendage thrombus was detected.  5. Moderate pericardial effusion. The pericardial effusion is anterior to the right ventricle.  6. The mitral valve is grossly normal. Trivial mitral valve regurgitation. No evidence of mitral stenosis.  7. The aortic valve is tricuspid. Aortic valve regurgitation is not visualized. No aortic stenosis is present.  8. Agitated saline contrast bubble study was negative, with no evidence of any interatrial shunt. Conclusion(s)/Recommendation(s): No evidence of vegetation/infective endocarditis on this transesophageael echocardiogram. FINDINGS  Left Ventricle: Left ventricular ejection fraction, by estimation, is 60 to 65%. The left ventricle has normal function. The left ventricular internal cavity size was normal in size. Right Ventricle: The right ventricular size is normal. No increase in right ventricular wall thickness. Right ventricular systolic function is normal. Left Atrium: Left atrial size was normal in size. No left atrial/left atrial appendage thrombus was detected. Right Atrium: Right atrial size was normal in size. Pericardium: A moderately sized pericardial effusion is present. The pericardial effusion is anterior to the right ventricle. Mitral Valve: The mitral valve is grossly normal.  54098    Report Status PENDING  Incomplete         Radiology Studies: ECHO TEE  Result Date: 02/18/2023    TRANSESOPHOGEAL ECHO REPORT   Patient Name:   [name] Date of Exam: 02/18/2023 Medical Rec #:  [id number]     Height:       63.0 in Accession #:    [id number]    Weight:       146.8 lb Date of Birth:  [birth year]     BSA:          1.696 m Patient Age:    51 years      BP:           140/61 mmHg Patient Gender: F             HR:           111 bpm. Exam Location:  Inpatient Procedure: Transesophageal Echo, Cardiac Doppler, Color Doppler and Saline            Contrast Bubble Study Indications:     Candidemia. Splenic infarct.  History:         Patient has prior history of Echocardiogram examinations, most                  recent 02/07/2023. Sepsis and COPD, Arrythmias:Cardiac Arrest;                  Risk Factors:Current Smoker.  Sonographer:     Delcie Roch RDCS Referring Phys:  [id number] Cyndi Bender Diagnosing Phys: Lennie Odor MD PROCEDURE: After discussion of the risks and benefits of a TEE, an informed consent was obtained from the patient. TEE procedure time was 10 minutes. The transesophogeal probe was passed without difficulty through the esophogus of the patient. Imaged were obtained with the patient in a left lateral decubitus position. Sedation performed by different physician. The patient was monitored while under deep sedation. Anesthestetic sedation was provided intravenously by Anesthesiology: 114mg  of Propofol. Image quality was excellent. The patient's vital signs; including heart rate, blood pressure, and oxygen saturation; remained stable throughout the procedure. The patient developed no complications during the procedure.  IMPRESSIONS  1. Negative TEE for endocarditis.  2. Left ventricular ejection fraction, by estimation, is 60 to  65%. The left ventricle has normal function.  3. Right ventricular systolic function is normal. The right ventricular size is normal.  4. No left atrial/left atrial appendage thrombus was detected.  5. Moderate pericardial effusion. The pericardial effusion is anterior to the right ventricle.  6. The mitral valve is grossly normal. Trivial mitral valve regurgitation. No evidence of mitral stenosis.  7. The aortic valve is tricuspid. Aortic valve regurgitation is not visualized. No aortic stenosis is present.  8. Agitated saline contrast bubble study was negative, with no evidence of any interatrial shunt. Conclusion(s)/Recommendation(s): No evidence of vegetation/infective endocarditis on this transesophageael echocardiogram. FINDINGS  Left Ventricle: Left ventricular ejection fraction, by estimation, is 60 to 65%. The left ventricle has normal function. The left ventricular internal cavity size was normal in size. Right Ventricle: The right ventricular size is normal. No increase in right ventricular wall thickness. Right ventricular systolic function is normal. Left Atrium: Left atrial size was normal in size. No left atrial/left atrial appendage thrombus was detected. Right Atrium: Right atrial size was normal in size. Pericardium: A moderately sized pericardial effusion is present. The pericardial effusion is anterior to the right ventricle. Mitral Valve: The mitral valve is grossly normal.  54098    Report Status PENDING  Incomplete         Radiology Studies: ECHO TEE  Result Date: 02/18/2023    TRANSESOPHOGEAL ECHO REPORT   Patient Name:   [name] Date of Exam: 02/18/2023 Medical Rec #:  [id number]     Height:       63.0 in Accession #:    [id number]    Weight:       146.8 lb Date of Birth:  [birth year]     BSA:          1.696 m Patient Age:    51 years      BP:           140/61 mmHg Patient Gender: F             HR:           111 bpm. Exam Location:  Inpatient Procedure: Transesophageal Echo, Cardiac Doppler, Color Doppler and Saline            Contrast Bubble Study Indications:     Candidemia. Splenic infarct.  History:         Patient has prior history of Echocardiogram examinations, most                  recent 02/07/2023. Sepsis and COPD, Arrythmias:Cardiac Arrest;                  Risk Factors:Current Smoker.  Sonographer:     Delcie Roch RDCS Referring Phys:  [id number] Cyndi Bender Diagnosing Phys: Lennie Odor MD PROCEDURE: After discussion of the risks and benefits of a TEE, an informed consent was obtained from the patient. TEE procedure time was 10 minutes. The transesophogeal probe was passed without difficulty through the esophogus of the patient. Imaged were obtained with the patient in a left lateral decubitus position. Sedation performed by different physician. The patient was monitored while under deep sedation. Anesthestetic sedation was provided intravenously by Anesthesiology: 114mg  of Propofol. Image quality was excellent. The patient's vital signs; including heart rate, blood pressure, and oxygen saturation; remained stable throughout the procedure. The patient developed no complications during the procedure.  IMPRESSIONS  1. Negative TEE for endocarditis.  2. Left ventricular ejection fraction, by estimation, is 60 to  65%. The left ventricle has normal function.  3. Right ventricular systolic function is normal. The right ventricular size is normal.  4. No left atrial/left atrial appendage thrombus was detected.  5. Moderate pericardial effusion. The pericardial effusion is anterior to the right ventricle.  6. The mitral valve is grossly normal. Trivial mitral valve regurgitation. No evidence of mitral stenosis.  7. The aortic valve is tricuspid. Aortic valve regurgitation is not visualized. No aortic stenosis is present.  8. Agitated saline contrast bubble study was negative, with no evidence of any interatrial shunt. Conclusion(s)/Recommendation(s): No evidence of vegetation/infective endocarditis on this transesophageael echocardiogram. FINDINGS  Left Ventricle: Left ventricular ejection fraction, by estimation, is 60 to 65%. The left ventricle has normal function. The left ventricular internal cavity size was normal in size. Right Ventricle: The right ventricular size is normal. No increase in right ventricular wall thickness. Right ventricular systolic function is normal. Left Atrium: Left atrial size was normal in size. No left atrial/left atrial appendage thrombus was detected. Right Atrium: Right atrial size was normal in size. Pericardium: A moderately sized pericardial effusion is present. The pericardial effusion is anterior to the right ventricle. Mitral Valve: The mitral valve is grossly normal.  PROGRESS NOTE    [name]  [name]:[id number] DOB: 1970-12-28 DOA: 01/27/2023 PCP: [name], PA-C   Brief Narrative:  This is a 52 year old female with past medical history of COPD, Crohn's on Skyrizi, hypertension, type 2 diabetes mellitus who initially presented to the after being found unresponsive. Per family, patient has had bloody emesis with diarrhea over the past few days. Initial systolics in the 60s in the ED, and patient was minimally responsive. In the emergency room, patient was intubated, and patient was admitted to the ICU for further evaluation and management for shock and altered mental status.    01/27/2023: Admitted to ICU and intubated.  Felt to have septic shock of unclear source which resolved antibiotic therapy and fluid resuscitation.  Suspicion was that she may have had C. difficile colitis despite negative toxin.  She was treated with vancomycin. 9/29 Off pressors 10/2: Extubated 10/3 transfer to floor. 10/10 underwent repeat proctosigmoidoscopy for ongoing diarrhea.  Diffuse severe inflammation identified due to combination of Crohn's disease, active C. difficile infection and possible ischemic colitis from prior hypotension on presentation. 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  Intraoperative findings include frank necrosis with pelvic abscess formation and significant colitis. 10/12 some dilated small bowel with minimal OG output.  Continued observation advised.  Having significant postoperative pain. 10/13 became tachycardic and hypotensive.  More lethargic.  Transferred back to ICU. 10/15 blood cultures reports fungemia, resume PO vancomycin 10/17 CT abdomen pelvis notable for septic emboli - CTA chest on hold given IV access issues 10/18 TEE negative for endocarditis/bubble study - NG out for TEE probe.  Advance diet, hoping to avoid replacement of NG tube 10/19 wound VAC dysfunctional, surgery switching to wet-to-dry over the  weekend -pain poorly controlled today, add oxycodone (medication lists this is an allergy but patient verbally approved)   Assessment & Plan:   Principal Problem:   Fungemia Active Problems:   Metabolic encephalopathy   Shock (HCC)   Cardiac arrest (HCC)   Acute respiratory failure with hypoxia (HCC)   Upper GI bleed   Granulomatous colitis (HCC)   Pseudomembranous colitis   Crohn's disease without complication (HCC)   Ileus (HCC)   C. difficile diarrhea   Sepsis (HCC)   Pressure injury of skin   Hyponatremia   Severe protein-calorie malnutrition (HCC)   ABLA (acute blood loss anemia)   Pulmonary embolus (HCC)   Splenic infarct   Septic shock, multifactorial, POA -Cultures not reporting fungemia, fulminant C. difficile colitis and intra-abdominal abscess also notable sources for infection -ID following, appreciate insight recommendations - continue micafungin, vancomycin, ceftriaxone, Flagyl -Status post colectomy -CT abdomen pelvis shows splenic and pulmonary emboli -Likely okay to place PICC/central line on the 20th if cultures remain negative, this will allow CT chest with contrast to further evaluate for septic emboli -DVT studies of the upper and lower extremities reveal right IJ and brachial vein DVT, left brachial vein DVT also noted -lower extremity DVT studies negative -TEE 10/18 negative for vegetations, bubble study negative **ID recommending neurology consult Monday the 21st for possible dilated funduscopic exam to rule out fungal endophthalmitis   Infectious colitis due to C. difficile on treatment -See above -High output ostomy, continue to supplement p.o. and IV hydration as appropriate  Longstanding Crohn's disease; not chronically on steroids so less concern for adrenal insufficiency -Hold steroids, Skyrizi in the setting of ongoing infection   Type 2 diabetes -SSI PRN, has not needed much insulin currently -Goal BG 140-180   History  PROGRESS NOTE    [name]  [name]:[id number] DOB: 1970-12-28 DOA: 01/27/2023 PCP: [name], PA-C   Brief Narrative:  This is a 52 year old female with past medical history of COPD, Crohn's on Skyrizi, hypertension, type 2 diabetes mellitus who initially presented to the after being found unresponsive. Per family, patient has had bloody emesis with diarrhea over the past few days. Initial systolics in the 60s in the ED, and patient was minimally responsive. In the emergency room, patient was intubated, and patient was admitted to the ICU for further evaluation and management for shock and altered mental status.    01/27/2023: Admitted to ICU and intubated.  Felt to have septic shock of unclear source which resolved antibiotic therapy and fluid resuscitation.  Suspicion was that she may have had C. difficile colitis despite negative toxin.  She was treated with vancomycin. 9/29 Off pressors 10/2: Extubated 10/3 transfer to floor. 10/10 underwent repeat proctosigmoidoscopy for ongoing diarrhea.  Diffuse severe inflammation identified due to combination of Crohn's disease, active C. difficile infection and possible ischemic colitis from prior hypotension on presentation. 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  Intraoperative findings include frank necrosis with pelvic abscess formation and significant colitis. 10/12 some dilated small bowel with minimal OG output.  Continued observation advised.  Having significant postoperative pain. 10/13 became tachycardic and hypotensive.  More lethargic.  Transferred back to ICU. 10/15 blood cultures reports fungemia, resume PO vancomycin 10/17 CT abdomen pelvis notable for septic emboli - CTA chest on hold given IV access issues 10/18 TEE negative for endocarditis/bubble study - NG out for TEE probe.  Advance diet, hoping to avoid replacement of NG tube 10/19 wound VAC dysfunctional, surgery switching to wet-to-dry over the  weekend -pain poorly controlled today, add oxycodone (medication lists this is an allergy but patient verbally approved)   Assessment & Plan:   Principal Problem:   Fungemia Active Problems:   Metabolic encephalopathy   Shock (HCC)   Cardiac arrest (HCC)   Acute respiratory failure with hypoxia (HCC)   Upper GI bleed   Granulomatous colitis (HCC)   Pseudomembranous colitis   Crohn's disease without complication (HCC)   Ileus (HCC)   C. difficile diarrhea   Sepsis (HCC)   Pressure injury of skin   Hyponatremia   Severe protein-calorie malnutrition (HCC)   ABLA (acute blood loss anemia)   Pulmonary embolus (HCC)   Splenic infarct   Septic shock, multifactorial, POA -Cultures not reporting fungemia, fulminant C. difficile colitis and intra-abdominal abscess also notable sources for infection -ID following, appreciate insight recommendations - continue micafungin, vancomycin, ceftriaxone, Flagyl -Status post colectomy -CT abdomen pelvis shows splenic and pulmonary emboli -Likely okay to place PICC/central line on the 20th if cultures remain negative, this will allow CT chest with contrast to further evaluate for septic emboli -DVT studies of the upper and lower extremities reveal right IJ and brachial vein DVT, left brachial vein DVT also noted -lower extremity DVT studies negative -TEE 10/18 negative for vegetations, bubble study negative **ID recommending neurology consult Monday the 21st for possible dilated funduscopic exam to rule out fungal endophthalmitis   Infectious colitis due to C. difficile on treatment -See above -High output ostomy, continue to supplement p.o. and IV hydration as appropriate  Longstanding Crohn's disease; not chronically on steroids so less concern for adrenal insufficiency -Hold steroids, Skyrizi in the setting of ongoing infection   Type 2 diabetes -SSI PRN, has not needed much insulin currently -Goal BG 140-180   History

## 2023-02-19 NOTE — Plan of Care (Signed)
  Problem: Education: Goal: Knowledge of General Education information will improve Description Including pain rating scale, medication(s)/side effects and non-pharmacologic comfort measures Outcome: Progressing   

## 2023-02-19 NOTE — Consult Note (Signed)
WOC consult placed for wound vac clogged and alarming.  No WOC coverage on the weekend. This was addressed by Trixie Deis PA CCS, removed and WTD placed for weekend. WOC team will plan to replace NPWT Monday 02/21/2023.   Thank you,    Priscella Mann MSN, RN-BC, [employer] [phone]

## 2023-02-19 NOTE — Progress Notes (Signed)
[facility] provider on call contacted via pager about critical lab value for platelets

## 2023-02-19 NOTE — Progress Notes (Signed)
Upper extremity venous duplex completed. Please see CV Procedures for preliminary results.  Initial findings reported to Mordecai Rasmussen, Charity fundraiser.  Shona Simpson, RVT 02/19/23 12:38 PM

## 2023-02-19 NOTE — Progress Notes (Signed)
Lower extremity venous duplex completed. Please see CV Procedures for preliminary results.  Shona Simpson, RVT 02/19/23 12:39 PM

## 2023-02-19 NOTE — Progress Notes (Signed)
PHARMACY - ANTICOAGULATION CONSULT NOTE  Pharmacy Consult for heparin Indication: pulmonary embolus (no boluses per surgery due to recent colectomy)  Allergies  Allergen Reactions   Codeine Shortness Of Breath, Swelling and Rash    Throat swelling   Dilaudid [Hydromorphone Hcl] Shortness Of Breath and Swelling   Hydrocodone Shortness Of Breath, Swelling and Rash   Ketorolac Nausea And Vomiting and Swelling    Pt reports n/v and swelling to throat   Morphine And Codeine Shortness Of Breath, Swelling and Rash   Oxycodone Shortness Of Breath, Swelling and Rash    Patient states ALL pain medications make her deathly sick.   Penicillins Shortness Of Breath, Swelling and Other (See Comments)    Throat swells 02/06/21--TOLERATES CEFTRIAXONE     Nickel Rash   Pregabalin Other (See Comments)   Acetaminophen     Per MD patient states she can't take Tylenol because of liver enzymes   Ativan [Lorazepam] Other (See Comments)    Migraines.   Strawberry Extract Swelling   Watermelon Concentrate Swelling   Albuterol Palpitations   Benadryl [Diphenhydramine Hcl] Palpitations   Humira [Adalimumab] Rash   Latex Rash   Remicade [Infliximab] Rash    Blisters and Welts    Tramadol Rash    Rash and itching    Patient Measurements: Height: 5' 2.99" (160 cm) Weight: 66.6 kg (146 lb 13.2 oz) IBW/kg (Calculated) : 52.38 Heparin Dosing Weight: 68.8  Vital Signs: Temp: 98.9 F (37.2 C) (10/19 0335) Temp Source: Oral (10/19 0335) BP: 113/63 (10/19 0335) Pulse Rate: 116 (10/19 0335)  Labs: Recent Labs    02/17/23 0321 02/17/23 1242 02/17/23 2148 02/18/23 0539 02/19/23 0359  HGB 9.3*  --   --  9.8* 9.6*  HCT 29.4*  --   --  30.8* 30.3*  PLT 672*  --   --  556* 1,103*  HEPARINUNFRC <0.10*   < > <0.10* 0.45 0.53  CREATININE 0.37*  --   --  0.45 0.51   < > = values in this interval not displayed.    Estimated Creatinine Clearance: 74.6 mL/min (by C-G formula based on SCr of 0.51  mg/dL).   Assessment: Patient with PMH of COPD, HTN, Crohn's, DM, GERD, asthma, anxiety, head injury, IBS, PTSD, PSVT, and seizure admitted on 9/26 after being found down at home with bloody emesis. She underwent CRP x 5 min with ROSC. She underwent a flexible sigmoidoscopy-she was found to have changes consistent with pseudomembranous colitis. 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 On CT AP 10/16 there was noted splenic infarcts and pulm emboli. Pharmacy consulted on 10/16 to dose heparin without boluses due to recent colectomy.   Heparin level is therapeutic at 0.53, H/H stable.  Goal of Therapy:  Heparin level 0.3-0.7 units/ml Monitor platelets by anticoagulation protocol: Yes   Plan:  Continue heparin 1900 units/h Daily heparin level and CBC  Blane Ohara, PharmD  PGY2 Pharmacy Resident

## 2023-02-19 NOTE — Progress Notes (Signed)
Progress Note  1 Day Post-Op  Subjective: VAC alarming. Patient reports she is just hurting and tired.   Objective: Vital signs in last 24 hours: Temp:  [97.6 F (36.4 C)-98.9 F (37.2 C)] 98.9 F (37.2 C) (10/19 0335) Pulse Rate:  [112-117] 116 (10/19 0335) Resp:  [15-21] 15 (10/19 0335) BP: (108-145)/(46-100) 113/63 (10/19 0335) SpO2:  [93 %-95 %] 94 % (10/19 0335) Last BM Date : 02/19/23  Intake/Output from previous day: 10/18 0701 - 10/19 0700 In: 3897.3 [P.O.:3000; I.V.:439.3; IV Piggyback:458] Out: 3715 [Urine:1900; Drains:115; Stool:1700] Intake/Output this shift: Total I/O In: -  Out: 550 [Stool:550]  PE: General: pleasant, WD, WN female who is laying in bed in NAD Heart: sinus tachycardia in the 120s Lungs: Respiratory effort nonlabored Abd: soft, appropriately ttp, VAC to midline and not sealed and some purulent drainage in tubing, ostomy pink with large volume liquid, drain purulent    Lab Results:  Recent Labs    02/18/23 0539 02/19/23 0359  WBC 39.9* 32.6*  HGB 9.8* 9.6*  HCT 30.8* 30.3*  PLT 556* 1,103*   BMET Recent Labs    02/18/23 0539 02/19/23 0359  NA 134* 134*  K 4.4 3.7  CL 99 95*  CO2 22 24  GLUCOSE 111* 105*  BUN <5* <5*  CREATININE 0.45 0.51  CALCIUM 9.1 9.6   PT/INR No results for input(s): "LABPROT", "INR" in the last 72 hours. CMP     Component Value Date/Time   NA 134 (L) 02/19/2023 0359   K 3.7 02/19/2023 0359   CL 95 (L) 02/19/2023 0359   CO2 24 02/19/2023 0359   GLUCOSE 105 (H) 02/19/2023 0359   BUN <5 (L) 02/19/2023 0359   CREATININE 0.51 02/19/2023 0359   CREATININE 0.68 06/09/2021 1305   CALCIUM 9.6 02/19/2023 0359   PROT 5.9 (L) 02/19/2023 0359   ALBUMIN 2.7 (L) 02/19/2023 0359   AST 15 02/19/2023 0359   ALT 10 02/19/2023 0359   ALKPHOS 161 (H) 02/19/2023 0359   BILITOT 0.5 02/19/2023 0359   GFRNONAA >60 02/19/2023 0359   GFRAA >60 07/30/2019 1301   Lipase     Component Value Date/Time   LIPASE  27 07/09/2022 1313       Studies/Results: ECHO TEE  Result Date: 02/18/2023    TRANSESOPHOGEAL ECHO REPORT   Patient Name:   [name] Date of Exam: 02/18/2023 Medical Rec #:  [id number]     Height:       63.0 in Accession #:    [id number]    Weight:       146.8 lb Date of Birth:  05-26-1970     BSA:          1.696 m Patient Age:    51 years      BP:           140/61 mmHg Patient Gender: F             HR:           111 bpm. Exam Location:  Inpatient Procedure: Transesophageal Echo, Cardiac Doppler, Color Doppler and Saline            Contrast Bubble Study Indications:     Candidemia. Splenic infarct.  History:         Patient has prior history of Echocardiogram examinations, most                  recent 02/07/2023. Sepsis and COPD, Arrythmias:Cardiac Arrest;  Risk Factors:Current Smoker.  Sonographer:     Delcie Roch RDCS Referring Phys:  [id number] Cyndi Bender Diagnosing Phys: Lennie Odor MD PROCEDURE: After discussion of the risks and benefits of a TEE, an informed consent was obtained from the patient. TEE procedure time was 10 minutes. The transesophogeal probe was passed without difficulty through the esophogus of the patient. Imaged were obtained with the patient in a left lateral decubitus position. Sedation performed by different physician. The patient was monitored while under deep sedation. Anesthestetic sedation was provided intravenously by Anesthesiology: 114mg  of Propofol. Image quality was excellent. The patient's vital signs; including heart rate, blood pressure, and oxygen saturation; remained stable throughout the procedure. The patient developed no complications during the procedure.  IMPRESSIONS  1. Negative TEE for endocarditis.  2. Left ventricular ejection fraction, by estimation, is 60 to 65%. The left ventricle has normal function.  3. Right ventricular systolic function is normal. The right ventricular size is normal.  4. No left atrial/left atrial appendage  thrombus was detected.  5. Moderate pericardial effusion. The pericardial effusion is anterior to the right ventricle.  6. The mitral valve is grossly normal. Trivial mitral valve regurgitation. No evidence of mitral stenosis.  7. The aortic valve is tricuspid. Aortic valve regurgitation is not visualized. No aortic stenosis is present.  8. Agitated saline contrast bubble study was negative, with no evidence of any interatrial shunt. Conclusion(s)/Recommendation(s): No evidence of vegetation/infective endocarditis on this transesophageael echocardiogram. FINDINGS  Left Ventricle: Left ventricular ejection fraction, by estimation, is 60 to 65%. The left ventricle has normal function. The left ventricular internal cavity size was normal in size. Right Ventricle: The right ventricular size is normal. No increase in right ventricular wall thickness. Right ventricular systolic function is normal. Left Atrium: Left atrial size was normal in size. No left atrial/left atrial appendage thrombus was detected. Right Atrium: Right atrial size was normal in size. Pericardium: A moderately sized pericardial effusion is present. The pericardial effusion is anterior to the right ventricle. Mitral Valve: The mitral valve is grossly normal. Trivial mitral valve regurgitation. No evidence of mitral valve stenosis. There is no evidence of mitral valve vegetation. Tricuspid Valve: The tricuspid valve is grossly normal. Tricuspid valve regurgitation is mild . No evidence of tricuspid stenosis. Aortic Valve: The aortic valve is tricuspid. Aortic valve regurgitation is not visualized. No aortic stenosis is present. There is no evidence of aortic valve vegetation. Pulmonic Valve: The pulmonic valve was grossly normal. Pulmonic valve regurgitation is trivial. No evidence of pulmonic stenosis. There is no evidence of pulmonic valve vegetation. Aorta: The aortic root, ascending aorta, aortic arch and descending aorta are all structurally  normal, with no evidence of dilitation or obstruction. Venous: The left upper pulmonary vein, left lower pulmonary vein, right lower pulmonary vein and right upper pulmonary vein are normal. IAS/Shunts: No atrial level shunt detected by color flow Doppler. Agitated saline contrast was given intravenously to evaluate for intracardiac shunting. Agitated saline contrast bubble study was negative, with no evidence of any interatrial shunt.   AORTA Ao Root diam: 3.39 cm Ao Asc diam:  2.92 cm Lennie Odor MD Electronically signed by Lennie Odor MD Signature Date/Time: 02/18/2023/12:56:06 PM    Final    EP STUDY  Result Date: 02/18/2023 See surgical note for result.   Anti-infectives: Anti-infectives (From admission, onward)    Start     Dose/Rate Route Frequency Ordered Stop   02/18/23 1800  vancomycin (VANCOCIN) 50 mg/mL oral solution SOLN 125 mg  125 mg Oral 4 times daily 02/18/23 1439 02/26/23 0959   02/18/23 0400  micafungin (MYCAMINE) 150 mg in sodium chloride 0.9 % 100 mL IVPB        150 mg 107.5 mL/hr over 1 Hours Intravenous Every 24 hours 02/17/23 0936     02/17/23 0900  vancomycin (VANCOREADY) IVPB 750 mg/150 mL  Status:  Discontinued        750 mg 150 mL/hr over 60 Minutes Intravenous Every 8 hours 02/17/23 0204 02/17/23 0947   02/17/23 0400  micafungin (MYCAMINE) 100 mg in sodium chloride 0.9 % 100 mL IVPB  Status:  Discontinued        100 mg 105 mL/hr over 1 Hours Intravenous Every 24 hours 02/16/23 0353 02/17/23 0936   02/16/23 1045  vancomycin (VANCOCIN) 50 mg/mL oral solution SOLN 125 mg  Status:  Discontinued        125 mg Per Tube 4 times daily 02/16/23 0953 02/18/23 1439   02/16/23 0415  micafungin (MYCAMINE) 100 mg in sodium chloride 0.9 % 100 mL IVPB        100 mg 105 mL/hr over 1 Hours Intravenous STAT 02/16/23 0353 02/16/23 0610   02/15/23 1300  vancomycin (VANCOREADY) IVPB 750 mg/150 mL  Status:  Discontinued        750 mg 150 mL/hr over 60 Minutes Intravenous  Every 12 hours 02/14/23 1156 02/17/23 0204   02/14/23 1130  vancomycin (VANCOCIN) IVPB 1000 mg/200 mL premix        1,000 mg 200 mL/hr over 60 Minutes Intravenous  Once 02/14/23 1043 02/14/23 1249   02/11/23 2000  cefTRIAXone (ROCEPHIN) 2 g in sodium chloride 0.9 % 100 mL IVPB       Note to Pharmacy: Pharmacy may adjust dosing strength / duration / interval for maximal efficacy   2 g 200 mL/hr over 30 Minutes Intravenous Every 24 hours 02/11/23 1622     02/10/23 1600  metroNIDAZOLE (FLAGYL) IVPB 500 mg        500 mg 100 mL/hr over 60 Minutes Intravenous Every 12 hours 02/10/23 1444     02/08/23 1800  vancomycin (VANCOCIN) 50 mg/mL oral solution SOLN 500 mg        500 mg Per Tube Every 6 hours 02/08/23 1325 02/12/23 1759   02/03/23 2200  metroNIDAZOLE (FLAGYL) tablet 500 mg        500 mg Per Tube Every 12 hours 02/03/23 1406 02/06/23 2148   02/02/23 1815  vancomycin (VANCOCIN) 50 mg/mL oral solution SOLN 125 mg  Status:  Discontinued        125 mg Per Tube Every 6 hours 02/02/23 1717 02/08/23 1325   02/02/23 1545  vancomycin (VANCOCIN) capsule 125 mg  Status:  Discontinued        125 mg Oral 4 times daily 02/02/23 1530 02/02/23 1717   01/27/23 1430  cefTRIAXone (ROCEPHIN) 2 g in sodium chloride 0.9 % 100 mL IVPB        2 g 200 mL/hr over 30 Minutes Intravenous Every 24 hours 01/27/23 1340 02/05/23 1403   01/27/23 1400  metroNIDAZOLE (FLAGYL) IVPB 500 mg  Status:  Discontinued        500 mg 100 mL/hr over 60 Minutes Intravenous Every 12 hours 01/27/23 1340 02/03/23 1406   01/27/23 0545  vancomycin (VANCOREADY) IVPB 1250 mg/250 mL        1,250 mg 166.7 mL/hr over 90 Minutes Intravenous  Once 01/27/23 0542 01/27/23 0809   01/27/23 0545  ceFEPIme (  MAXIPIME) 2 g in sodium chloride 0.9 % 100 mL IVPB        2 g 200 mL/hr over 30 Minutes Intravenous  Once 01/27/23 0542 01/27/23 0621   01/27/23 0545  metroNIDAZOLE (FLAGYL) IVPB 500 mg        500 mg 100 mL/hr over 60 Minutes Intravenous  Once  01/27/23 0542 01/27/23 0741        Assessment/Plan Refractory Crohn's 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 Luisa Hart  - Leukocytosis trending down today 32k  - no signs of undrained abdominal collection on CT 10/16 - TEE negative for endocarditis  - soft diet, QID imodium, fiber, iron - monitor output and titrate as needed  - no PICC/TPN in setting of bacteremia/possible fungemia  - Continue drain - continue medical treatment for C.diff - mobilize - WOC following for new ileostomy and VAC - DC VAC today and transition to WTD for the weekend   FEN: soft diet VTE: heparin gtt ID: rocephin/flagyl/micafungin, PO vanc  - per CCM -  Acute PEs - heparin gtt, ?etiology  Splenic infarcts - nothing surgical recommended at this time but agree with ID need to eval etiology of emboli  COPD HTN Chronic pain  Collagen vascular disease T2DM Seizure disorder  LOS: 23 days     Juliet Rude, [facility] Surgery 02/19/2023, 12:00 PM Please see Amion for pager number during day hours 7:00am-4:30pm

## 2023-02-20 ENCOUNTER — Other Ambulatory Visit: Payer: Self-pay

## 2023-02-20 DIAGNOSIS — K567 Ileus, unspecified: Secondary | ICD-10-CM | POA: Diagnosis not present

## 2023-02-20 DIAGNOSIS — K5010 Crohn's disease of large intestine without complications: Secondary | ICD-10-CM | POA: Diagnosis not present

## 2023-02-20 DIAGNOSIS — B49 Unspecified mycosis: Secondary | ICD-10-CM | POA: Diagnosis not present

## 2023-02-20 DIAGNOSIS — A0472 Enterocolitis due to Clostridium difficile, not specified as recurrent: Secondary | ICD-10-CM | POA: Diagnosis not present

## 2023-02-20 LAB — CBC
HCT: 31.1 % — ABNORMAL LOW (ref 36.0–46.0)
Hemoglobin: 10 g/dL — ABNORMAL LOW (ref 12.0–15.0)
MCH: 27.7 pg (ref 26.0–34.0)
MCHC: 32.2 g/dL (ref 30.0–36.0)
MCV: 86.1 fL (ref 80.0–100.0)
Platelets: 1097 10*3/uL (ref 150–400)
RBC: 3.61 MIL/uL — ABNORMAL LOW (ref 3.87–5.11)
RDW: 16.1 % — ABNORMAL HIGH (ref 11.5–15.5)
WBC: 35.2 10*3/uL — ABNORMAL HIGH (ref 4.0–10.5)
nRBC: 0.3 % — ABNORMAL HIGH (ref 0.0–0.2)

## 2023-02-20 LAB — HEPARIN LEVEL (UNFRACTIONATED)
Heparin Unfractionated: 0.22 [IU]/mL — ABNORMAL LOW (ref 0.30–0.70)
Heparin Unfractionated: 0.24 [IU]/mL — ABNORMAL LOW (ref 0.30–0.70)
Heparin Unfractionated: 0.56 [IU]/mL (ref 0.30–0.70)

## 2023-02-20 LAB — COMPREHENSIVE METABOLIC PANEL
ALT: 7 U/L (ref 0–44)
AST: 17 U/L (ref 15–41)
Albumin: 2.7 g/dL — ABNORMAL LOW (ref 3.5–5.0)
Alkaline Phosphatase: 242 U/L — ABNORMAL HIGH (ref 38–126)
Anion gap: 16 — ABNORMAL HIGH (ref 5–15)
BUN: <5 mg/dL — ABNORMAL LOW (ref 6–20)
CO2: 19 mmol/L — ABNORMAL LOW (ref 22–32)
Calcium: 8.8 mg/dL — ABNORMAL LOW (ref 8.9–10.3)
Chloride: 96 mmol/L — ABNORMAL LOW (ref 98–111)
Creatinine, Ser: 0.41 mg/dL — ABNORMAL LOW (ref 0.44–1.00)
GFR, Estimated: >60 mL/min (ref >60)
Glucose, Bld: 99 mg/dL (ref 70–99)
Potassium: 4.3 mmol/L (ref 3.5–5.1)
Sodium: 131 mmol/L — ABNORMAL LOW (ref 135–145)
Total Bilirubin: 0.6 mg/dL (ref 0.3–1.2)
Total Protein: 6 g/dL — ABNORMAL LOW (ref 6.5–8.1)

## 2023-02-20 LAB — GLUCOSE, CAPILLARY
Glucose-Capillary: 109 mg/dL — ABNORMAL HIGH (ref 70–99)
Glucose-Capillary: 107 mg/dL — ABNORMAL HIGH (ref 70–99)
Glucose-Capillary: 115 mg/dL — ABNORMAL HIGH (ref 70–99)
Glucose-Capillary: 126 mg/dL — ABNORMAL HIGH (ref 70–99)
Glucose-Capillary: 115 mg/dL — ABNORMAL HIGH (ref 70–99)

## 2023-02-20 MED ORDER — ALPRAZOLAM 0.25 MG PO TABS
0.2500 mg | ORAL_TABLET | Freq: Three times a day (TID) | ORAL | Status: DC | PRN
  Administered 2023-02-20: 0.25 mg
  Filled 2023-02-20: qty 1

## 2023-02-20 MED ORDER — ALPRAZOLAM 0.25 MG PO TABS
0.2500 mg | ORAL_TABLET | Freq: Three times a day (TID) | ORAL | Status: DC | PRN
  Administered 2023-02-20 – 2023-03-19 (×61): 0.25 mg via ORAL
  Filled 2023-02-20 (×64): qty 1

## 2023-02-20 MED ORDER — BANATROL TF EN LIQD
60.0000 mL | Freq: Four times a day (QID) | ENTERAL | Status: DC
  Administered 2023-02-20 (×2): 60 mL via ORAL
  Filled 2023-02-20 (×6): qty 60

## 2023-02-20 MED ORDER — HYDROMORPHONE HCL 1 MG/ML IJ SOLN
0.5000 mg | INTRAMUSCULAR | Status: DC | PRN
  Administered 2023-02-20 – 2023-03-09 (×59): 0.5 mg via INTRAVENOUS
  Filled 2023-02-20 (×60): qty 0.5

## 2023-02-20 NOTE — Progress Notes (Signed)
PHARMACY - ANTICOAGULATION CONSULT NOTE  Pharmacy Consult for heparin Indication: pulmonary embolus (no boluses per surgery due to recent colectomy)  Allergies  Allergen Reactions   Codeine Shortness Of Breath, Swelling and Rash    Throat swelling   Dilaudid [Hydromorphone Hcl] Shortness Of Breath and Swelling   Hydrocodone Shortness Of Breath, Swelling and Rash   Ketorolac Nausea And Vomiting and Swelling    Pt reports n/v and swelling to throat   Morphine And Codeine Shortness Of Breath, Swelling and Rash   Oxycodone Shortness Of Breath, Swelling and Rash    Patient states ALL pain medications make her deathly sick.   Penicillins Shortness Of Breath, Swelling and Other (See Comments)    Throat swells 02/06/21--TOLERATES CEFTRIAXONE     Nickel Rash   Pregabalin Other (See Comments)   Acetaminophen     Per MD patient states she can't take Tylenol because of liver enzymes   Ativan [Lorazepam] Other (See Comments)    Migraines.   Strawberry Extract Swelling   Watermelon Concentrate Swelling   Albuterol Palpitations   Benadryl [Diphenhydramine Hcl] Palpitations   Humira [Adalimumab] Rash   Latex Rash   Remicade [Infliximab] Rash    Blisters and Welts    Tramadol Rash    Rash and itching    Patient Measurements: Height: 5' 2.99" (160 cm) Weight: 66.6 kg (146 lb 13.2 oz) IBW/kg (Calculated) : 52.38 Heparin Dosing Weight: 68.8  Vital Signs: Temp: 98.2 F (36.8 C) (10/20 0400) Temp Source: Oral (10/19 2055) BP: 133/60 (10/20 0400) Pulse Rate: 119 (10/19 2055)  Labs: Recent Labs    02/18/23 0539 02/19/23 0359 02/20/23 0341  HGB 9.8* 9.6* 10.0*  HCT 30.8* 30.3* 31.1*  PLT 556* 1,103* 1,097*  HEPARINUNFRC 0.45 0.53 0.22*  CREATININE 0.45 0.51  --     Estimated Creatinine Clearance: 74.6 mL/min (by C-G formula based on SCr of 0.51 mg/dL).   Assessment: Patient with PMH of COPD, HTN, Crohn's, DM, GERD, asthma, anxiety, head injury, IBS, PTSD, PSVT, and seizure  admitted on 9/26 after being found down at home with bloody emesis. She underwent CRP x 5 min with ROSC. She underwent a flexible sigmoidoscopy-she was found to have changes consistent with pseudomembranous colitis. 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 On CT AP 10/16 there was noted splenic infarcts and pulm emboli. Pharmacy consulted on 10/16 to dose heparin without boluses due to recent colectomy.   Heparin level subtherapeutic this morning, no infusion issues overnight per RN.  Goal of Therapy:  Heparin level 0.3-0.7 units/ml Monitor platelets by anticoagulation protocol: Yes   Plan:  Increase heparin to 2100 units/h Recheck heparin level in 6h Daily heparin level and CBC  Fredonia Highland, PharmD, BCPS, [facility] Clinical Pharmacist Please check AMION for all [facility] Pharmacy numbers 02/20/2023

## 2023-02-20 NOTE — Progress Notes (Signed)
PHARMACY - ANTICOAGULATION CONSULT NOTE  Pharmacy Consult for heparin Indication: pulmonary embolus (no boluses per surgery due to recent colectomy)  Allergies  Allergen Reactions   Codeine Shortness Of Breath, Swelling and Rash    Throat swelling   Dilaudid [Hydromorphone Hcl] Shortness Of Breath and Swelling   Hydrocodone Shortness Of Breath, Swelling and Rash   Ketorolac Nausea And Vomiting and Swelling    Pt reports n/v and swelling to throat   Morphine And Codeine Shortness Of Breath, Swelling and Rash   Penicillins Shortness Of Breath, Swelling and Other (See Comments)    Throat swells 02/06/21--TOLERATES CEFTRIAXONE     Nickel Rash   Pregabalin Other (See Comments)   Acetaminophen     Per MD patient states she can't take Tylenol because of liver enzymes   Ativan [Lorazepam] Other (See Comments)    Migraines.   Strawberry Extract Swelling   Watermelon Concentrate Swelling   Albuterol Palpitations   Benadryl [Diphenhydramine Hcl] Palpitations   Humira [Adalimumab] Rash   Latex Rash   Remicade [Infliximab] Rash    Blisters and Welts    Tramadol Rash    Rash and itching    Patient Measurements: Height: 5' 2.99" (160 cm) Weight: 65 kg (143 lb 4.8 oz) IBW/kg (Calculated) : 52.38 Heparin Dosing Weight: 68.8  Vital Signs: Temp: 97.7 F (36.5 C) (10/20 1530) Temp Source: Oral (10/20 1530) BP: 128/52 (10/20 1530) Pulse Rate: 114 (10/20 1530)  Labs: Recent Labs    02/18/23 0539 02/19/23 0359 02/20/23 0341 02/20/23 1116 02/20/23 1803  HGB 9.8* 9.6* 10.0*  --   --   HCT 30.8* 30.3* 31.1*  --   --   PLT 556* 1,103* 1,097*  --   --   HEPARINUNFRC 0.45 0.53 0.22* 0.24* 0.56  CREATININE 0.45 0.51 0.41*  --   --     Estimated Creatinine Clearance: 73.7 mL/min (A) (by C-G formula based on SCr of 0.41 mg/dL (L)).   Assessment: Patient with PMH of COPD, HTN, Crohn's, DM, GERD, asthma, anxiety, head injury, IBS, PTSD, PSVT, and seizure admitted on 9/26 after being  found down at home with bloody emesis. She underwent CRP x 5 min with ROSC. She underwent a flexible sigmoidoscopy-she was found to have changes consistent with pseudomembranous colitis. 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 On CT AP 10/16 there was noted splenic infarcts and pulm emboli. Pharmacy consulted on 10/16 to dose heparin without boluses due to recent colectomy.   Heparin level therapeutic (0.56) on infusion at 2150 units/hr. No bleeding noted.  Goal of Therapy:  Heparin level 0.3-0.7 units/ml Monitor platelets by anticoagulation protocol: Yes   Plan:  Continue heparin at 2150 units/hr F/u 6 hr heparin level to confirm therapeutic  Christoper Fabian, PharmD, BCPS Please see amion for complete clinical pharmacist phone list 02/20/2023 7:14 PM

## 2023-02-20 NOTE — Progress Notes (Signed)
Progress Note  2 Days Post-Op  Subjective: Pt feeling better this AM - less pain and less agitated. She is tolerating diet but has poor appetite. I discussed with her that if there is anything she would like to eat this AM she can have whatever that is.   Objective: Vital signs in last 24 hours: Temp:  [97.6 F (36.4 C)-98.2 F (36.8 C)] 98 F (36.7 C) (10/20 0951) Pulse Rate:  [116-124] 124 (10/20 0951) Resp:  [13-15] 15 (10/20 0951) BP: (126-149)/(60-80) 149/80 (10/20 0951) SpO2:  [93 %-95 %] 95 % (10/20 0951) Weight:  [65 kg] 65 kg (10/20 0520) Last BM Date : 02/19/23  Intake/Output from previous day: 10/19 0701 - 10/20 0700 In: 344.4 [I.V.:152.1; IV Piggyback:192.3] Out: 2500 [Urine:600; Stool:1900] Intake/Output this shift: No intake/output data recorded.  PE: General: pleasant, WD, WN female who is laying in bed in NAD Heart: sinus tachycardia in the 120s Lungs: Respiratory effort nonlabored Abd: soft, appropriately ttp, midline wound with dressing c/d/i, ostomy pink with liquid output, drain more SS today    Lab Results:  Recent Labs    02/19/23 0359 02/20/23 0341  WBC 32.6* 35.2*  HGB 9.6* 10.0*  HCT 30.3* 31.1*  PLT 1,103* 1,097*   BMET Recent Labs    02/19/23 0359 02/20/23 0341  NA 134* 131*  K 3.7 4.3  CL 95* 96*  CO2 24 19*  GLUCOSE 105* 99  BUN <5* <5*  CREATININE 0.51 0.41*  CALCIUM 9.6 8.8*   PT/INR No results for input(s): "LABPROT", "INR" in the last 72 hours. CMP     Component Value Date/Time   NA 131 (L) 02/20/2023 0341   K 4.3 02/20/2023 0341   CL 96 (L) 02/20/2023 0341   CO2 19 (L) 02/20/2023 0341   GLUCOSE 99 02/20/2023 0341   BUN <5 (L) 02/20/2023 0341   CREATININE 0.41 (L) 02/20/2023 0341   CREATININE 0.68 06/09/2021 1305   CALCIUM 8.8 (L) 02/20/2023 0341   PROT 6.0 (L) 02/20/2023 0341   ALBUMIN 2.7 (L) 02/20/2023 0341   AST 17 02/20/2023 0341   ALT 7 02/20/2023 0341   ALKPHOS 242 (H) 02/20/2023 0341   BILITOT 0.6  02/20/2023 0341   GFRNONAA >60 02/20/2023 0341   GFRAA >60 07/30/2019 1301   Lipase     Component Value Date/Time   LIPASE 27 07/09/2022 1313       Studies/Results: VAS [place] LOWER EXTREMITY VENOUS (DVT)  Result Date: 02/19/2023  Lower Venous DVT Study Patient Name:  [name]  Date of Exam:   02/19/2023 Medical Rec #: [id number]      Accession #:    [id number] Date of Birth: 12-05-1970      Patient Gender: F Patient Age:   52 years Exam Location:  [facility] Procedure:      VAS [place] LOWER EXTREMITY VENOUS (DVT) Referring Phys: CORNELIUS VAN DAM --------------------------------------------------------------------------------  Indications: Pulmonary embolism.  Risk Factors: Immobility confirmed PE Surgery laparotomy 02/11/23 past pregnancy. Comparison Study: No significant changes seen since previous exam 02/07/23 Performing Technologist: Shona Simpson  Examination Guidelines: A complete evaluation includes B-mode imaging, spectral Doppler, color Doppler, and power Doppler as needed of all accessible portions of each vessel. Bilateral testing is considered an integral part of a complete examination. Limited examinations for reoccurring indications may be performed as noted. The reflux portion of the exam is performed with the patient in reverse Trendelenburg.  +---------+---------------+---------+-----------+----------+--------------+ RIGHT    CompressibilityPhasicitySpontaneityPropertiesThrombus Aging +---------+---------------+---------+-----------+----------+--------------+ CFV  SFJ      Full                                                        +---------+---------------+---------+-----------+----------+--------------+ FV Prox  Full                                                        +---------+---------------+---------+-----------+----------+--------------+ FV Mid   Full                                                        +---------+---------------+---------+-----------+----------+--------------+ FV DistalFull                                                         +---------+---------------+---------+-----------+----------+--------------+ PFV      Full                                                        +---------+---------------+---------+-----------+----------+--------------+ POP      Full           Yes      Yes                                 +---------+---------------+---------+-----------+----------+--------------+ PTV      Full                                                        +---------+---------------+---------+-----------+----------+--------------+ PERO     Full                                                        +---------+---------------+---------+-----------+----------+--------------+     Summary: BILATERAL: - No evidence of deep vein thrombosis seen in the lower extremities, bilaterally. -No evidence of popliteal cyst, bilaterally.   *See table(s) above for measurements and observations. Electronically signed by Sherald Hess MD on 02/19/2023 at 1:35:43 PM.    Final    VAS [place] UPPER EXTREMITY VENOUS DUPLEX  Result Date: 02/19/2023 UPPER VENOUS STUDY  Patient Name:  [name]  Date of Exam:   02/19/2023 Medical Rec #: [id number]      Accession #:    [id number] Date of Birth: 05-Jun-1970      Patient Gender: F Patient Age:   63 years Exam  SFJ      Full                                                        +---------+---------------+---------+-----------+----------+--------------+ FV Prox  Full                                                        +---------+---------------+---------+-----------+----------+--------------+ FV Mid   Full                                                        +---------+---------------+---------+-----------+----------+--------------+ FV DistalFull                                                         +---------+---------------+---------+-----------+----------+--------------+ PFV      Full                                                        +---------+---------------+---------+-----------+----------+--------------+ POP      Full           Yes      Yes                                 +---------+---------------+---------+-----------+----------+--------------+ PTV      Full                                                        +---------+---------------+---------+-----------+----------+--------------+ PERO     Full                                                        +---------+---------------+---------+-----------+----------+--------------+     Summary: BILATERAL: - No evidence of deep vein thrombosis seen in the lower extremities, bilaterally. -No evidence of popliteal cyst, bilaterally.   *See table(s) above for measurements and observations. Electronically signed by Sherald Hess MD on 02/19/2023 at 1:35:43 PM.    Final    VAS [place] UPPER EXTREMITY VENOUS DUPLEX  Result Date: 02/19/2023 UPPER VENOUS STUDY  Patient Name:  [name]  Date of Exam:   02/19/2023 Medical Rec #: [id number]      Accession #:    [id number] Date of Birth: 05-Jun-1970      Patient Gender: F Patient Age:   63 years Exam  Axillary      Full                                          +----------+------------+---------+-----------+----------+-------+ Brachial      None       Yes       Yes     dilated   Acute  +----------+------------+---------+-----------+----------+-------+ Radial        Full                                          +----------+------------+---------+-----------+----------+-------+ Ulnar         Full                                          +----------+------------+---------+-----------+----------+-------+ Cephalic      Full                                          +----------+------------+---------+-----------+----------+-------+ Basilic       Full                                          +----------+------------+---------+-----------+----------+-------+  Summary:  Right: No evidence of superficial vein thrombosis in the upper extremity. Findings consistent with acute deep vein thrombosis involving the right internal jugular vein and right brachial veins.  Left: No evidence of superficial vein thrombosis in the upper extremity. Findings consistent with acute deep vein thrombosis involving the left brachial veins.  *See table(s) above for measurements and observations.  Diagnosing physician: Sherald Hess MD Electronically signed by Sherald Hess MD on 02/19/2023 at 1:35:26 PM.    Final    ECHO TEE  Result Date: 02/18/2023    TRANSESOPHOGEAL ECHO REPORT   Patient Name:   [name] Date of Exam: 02/18/2023 Medical Rec #:  [id number]     Height:       63.0 in Accession #:    [id number]    Weight:       146.8 lb Date of Birth:  [birth year]     BSA:          1.696 m Patient Age:    51 years      BP:           140/61 mmHg Patient Gender: F             HR:           111 bpm. Exam Location:  Inpatient Procedure: Transesophageal Echo, Cardiac Doppler, Color  Doppler and Saline            Contrast Bubble Study Indications:     Candidemia. Splenic infarct.  History:         Patient has prior history of Echocardiogram examinations, most                  recent 02/07/2023. Sepsis and COPD, Arrythmias:Cardiac Arrest;                  Risk Factors:Current  SFJ      Full                                                        +---------+---------------+---------+-----------+----------+--------------+ FV Prox  Full                                                        +---------+---------------+---------+-----------+----------+--------------+ FV Mid   Full                                                        +---------+---------------+---------+-----------+----------+--------------+ FV DistalFull                                                         +---------+---------------+---------+-----------+----------+--------------+ PFV      Full                                                        +---------+---------------+---------+-----------+----------+--------------+ POP      Full           Yes      Yes                                 +---------+---------------+---------+-----------+----------+--------------+ PTV      Full                                                        +---------+---------------+---------+-----------+----------+--------------+ PERO     Full                                                        +---------+---------------+---------+-----------+----------+--------------+     Summary: BILATERAL: - No evidence of deep vein thrombosis seen in the lower extremities, bilaterally. -No evidence of popliteal cyst, bilaterally.   *See table(s) above for measurements and observations. Electronically signed by Sherald Hess MD on 02/19/2023 at 1:35:43 PM.    Final    VAS [place] UPPER EXTREMITY VENOUS DUPLEX  Result Date: 02/19/2023 UPPER VENOUS STUDY  Patient Name:  [name]  Date of Exam:   02/19/2023 Medical Rec #: [id number]      Accession #:    [id number] Date of Birth: 05-Jun-1970      Patient Gender: F Patient Age:   63 years Exam  Axillary      Full                                          +----------+------------+---------+-----------+----------+-------+ Brachial      None       Yes       Yes     dilated   Acute  +----------+------------+---------+-----------+----------+-------+ Radial        Full                                          +----------+------------+---------+-----------+----------+-------+ Ulnar         Full                                          +----------+------------+---------+-----------+----------+-------+ Cephalic      Full                                          +----------+------------+---------+-----------+----------+-------+ Basilic       Full                                          +----------+------------+---------+-----------+----------+-------+  Summary:  Right: No evidence of superficial vein thrombosis in the upper extremity. Findings consistent with acute deep vein thrombosis involving the right internal jugular vein and right brachial veins.  Left: No evidence of superficial vein thrombosis in the upper extremity. Findings consistent with acute deep vein thrombosis involving the left brachial veins.  *See table(s) above for measurements and observations.  Diagnosing physician: Sherald Hess MD Electronically signed by Sherald Hess MD on 02/19/2023 at 1:35:26 PM.    Final    ECHO TEE  Result Date: 02/18/2023    TRANSESOPHOGEAL ECHO REPORT   Patient Name:   [name] Date of Exam: 02/18/2023 Medical Rec #:  [id number]     Height:       63.0 in Accession #:    [id number]    Weight:       146.8 lb Date of Birth:  [birth year]     BSA:          1.696 m Patient Age:    51 years      BP:           140/61 mmHg Patient Gender: F             HR:           111 bpm. Exam Location:  Inpatient Procedure: Transesophageal Echo, Cardiac Doppler, Color  Doppler and Saline            Contrast Bubble Study Indications:     Candidemia. Splenic infarct.  History:         Patient has prior history of Echocardiogram examinations, most                  recent 02/07/2023. Sepsis and COPD, Arrythmias:Cardiac Arrest;                  Risk Factors:Current  Axillary      Full                                          +----------+------------+---------+-----------+----------+-------+ Brachial      None       Yes       Yes     dilated   Acute  +----------+------------+---------+-----------+----------+-------+ Radial        Full                                          +----------+------------+---------+-----------+----------+-------+ Ulnar         Full                                          +----------+------------+---------+-----------+----------+-------+ Cephalic      Full                                          +----------+------------+---------+-----------+----------+-------+ Basilic       Full                                          +----------+------------+---------+-----------+----------+-------+  Summary:  Right: No evidence of superficial vein thrombosis in the upper extremity. Findings consistent with acute deep vein thrombosis involving the right internal jugular vein and right brachial veins.  Left: No evidence of superficial vein thrombosis in the upper extremity. Findings consistent with acute deep vein thrombosis involving the left brachial veins.  *See table(s) above for measurements and observations.  Diagnosing physician: Sherald Hess MD Electronically signed by Sherald Hess MD on 02/19/2023 at 1:35:26 PM.    Final    ECHO TEE  Result Date: 02/18/2023    TRANSESOPHOGEAL ECHO REPORT   Patient Name:   [name] Date of Exam: 02/18/2023 Medical Rec #:  [id number]     Height:       63.0 in Accession #:    [id number]    Weight:       146.8 lb Date of Birth:  [birth year]     BSA:          1.696 m Patient Age:    51 years      BP:           140/61 mmHg Patient Gender: F             HR:           111 bpm. Exam Location:  Inpatient Procedure: Transesophageal Echo, Cardiac Doppler, Color  Doppler and Saline            Contrast Bubble Study Indications:     Candidemia. Splenic infarct.  History:         Patient has prior history of Echocardiogram examinations, most                  recent 02/07/2023. Sepsis and COPD, Arrythmias:Cardiac Arrest;                  Risk Factors:Current  SFJ      Full                                                        +---------+---------------+---------+-----------+----------+--------------+ FV Prox  Full                                                        +---------+---------------+---------+-----------+----------+--------------+ FV Mid   Full                                                        +---------+---------------+---------+-----------+----------+--------------+ FV DistalFull                                                         +---------+---------------+---------+-----------+----------+--------------+ PFV      Full                                                        +---------+---------------+---------+-----------+----------+--------------+ POP      Full           Yes      Yes                                 +---------+---------------+---------+-----------+----------+--------------+ PTV      Full                                                        +---------+---------------+---------+-----------+----------+--------------+ PERO     Full                                                        +---------+---------------+---------+-----------+----------+--------------+     Summary: BILATERAL: - No evidence of deep vein thrombosis seen in the lower extremities, bilaterally. -No evidence of popliteal cyst, bilaterally.   *See table(s) above for measurements and observations. Electronically signed by Sherald Hess MD on 02/19/2023 at 1:35:43 PM.    Final    VAS [place] UPPER EXTREMITY VENOUS DUPLEX  Result Date: 02/19/2023 UPPER VENOUS STUDY  Patient Name:  [name]  Date of Exam:   02/19/2023 Medical Rec #: [id number]      Accession #:    [id number] Date of Birth: 05-Jun-1970      Patient Gender: F Patient Age:   63 years Exam

## 2023-02-20 NOTE — Progress Notes (Signed)
PHARMACY - ANTICOAGULATION CONSULT NOTE  Pharmacy Consult for heparin Indication: pulmonary embolus (no boluses per surgery due to recent colectomy)  Allergies  Allergen Reactions   Codeine Shortness Of Breath, Swelling and Rash    Throat swelling   Dilaudid [Hydromorphone Hcl] Shortness Of Breath and Swelling   Hydrocodone Shortness Of Breath, Swelling and Rash   Ketorolac Nausea And Vomiting and Swelling    Pt reports n/v and swelling to throat   Morphine And Codeine Shortness Of Breath, Swelling and Rash   Penicillins Shortness Of Breath, Swelling and Other (See Comments)    Throat swells 02/06/21--TOLERATES CEFTRIAXONE     Nickel Rash   Pregabalin Other (See Comments)   Acetaminophen     Per MD patient states she can't take Tylenol because of liver enzymes   Ativan [Lorazepam] Other (See Comments)    Migraines.   Strawberry Extract Swelling   Watermelon Concentrate Swelling   Albuterol Palpitations   Benadryl [Diphenhydramine Hcl] Palpitations   Humira [Adalimumab] Rash   Latex Rash   Remicade [Infliximab] Rash    Blisters and Welts    Tramadol Rash    Rash and itching    Patient Measurements: Height: 5' 2.99" (160 cm) Weight: 65 kg (143 lb 4.8 oz) IBW/kg (Calculated) : 52.38 Heparin Dosing Weight: 68.8  Vital Signs: Temp: 98 F (36.7 C) (10/20 0951) Temp Source: Oral (10/20 0951) BP: 149/80 (10/20 0951) Pulse Rate: 124 (10/20 0951)  Labs: Recent Labs    02/18/23 0539 02/19/23 0359 02/20/23 0341 02/20/23 1116  HGB 9.8* 9.6* 10.0*  --   HCT 30.8* 30.3* 31.1*  --   PLT 556* 1,103* 1,097*  --   HEPARINUNFRC 0.45 0.53 0.22* 0.24*  CREATININE 0.45 0.51 0.41*  --     Estimated Creatinine Clearance: 73.7 mL/min (A) (by C-G formula based on SCr of 0.41 mg/dL (L)).   Assessment: Patient with PMH of COPD, HTN, Crohn's, DM, GERD, asthma, anxiety, head injury, IBS, PTSD, PSVT, and seizure admitted on 9/26 after being found down at home with bloody emesis.  She underwent CRP x 5 min with ROSC. She underwent a flexible sigmoidoscopy-she was found to have changes consistent with pseudomembranous colitis. 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 On CT AP 10/16 there was noted splenic infarcts and pulm emboli. Pharmacy consulted on 10/16 to dose heparin without boluses due to recent colectomy.   Heparin level subtherapeutic this morning at 0.24, infusion was paused for about an hour per RN due to difficulty obtaining the blood draw. Will increase slightly but recheck soon.   Goal of Therapy:  Heparin level 0.3-0.7 units/ml Monitor platelets by anticoagulation protocol: Yes   Plan:  Increase heparin to 2150 units/h Recheck heparin level in 6h Daily heparin level and CBC  Blane Ohara, PharmD  PGY2 Pharmacy Resident

## 2023-02-20 NOTE — Progress Notes (Addendum)
Culture   Final    NO GROWTH 2 DAYS Performed at [facility], [address] [place], [place] [address]    Report Status PENDING  Incomplete  Culture, blood (Routine X 2) w Reflex to ID Panel     Status: None (Preliminary result)   Collection Time: 02/17/23 12:42 PM   Specimen: BLOOD LEFT HAND  Result Value Ref Range Status   Specimen Description BLOOD LEFT HAND  Final   Special Requests   Final    BOTTLES DRAWN AEROBIC ONLY Blood Culture adequate volume   Culture   Final    NO GROWTH 2 DAYS Performed at [facility], [address] [place], [place] [address]    Report Status PENDING  Incomplete    Radiology Studies: VAS [place] LOWER EXTREMITY VENOUS (DVT)  Result Date: 02/19/2023  Lower Venous DVT Study Patient Name:  [name]  Date of Exam:   02/19/2023 Medical Rec #: [id number]      Accession #:    [id number] Date of Birth: 05-Jun-1970      Patient Gender: F Patient Age:   52 years Exam Location:  [facility] Procedure:      VAS [place] LOWER EXTREMITY VENOUS (DVT) Referring Phys: CORNELIUS VAN DAM --------------------------------------------------------------------------------  Indications: Pulmonary embolism.  Risk Factors: Immobility confirmed PE Surgery laparotomy 02/11/23 past pregnancy. Comparison Study: No significant changes seen since previous exam 02/07/23 Performing Technologist: Shona Simpson  Examination Guidelines: A complete evaluation includes B-mode imaging, spectral Doppler, color Doppler, and power Doppler as needed of all accessible portions of each vessel. Bilateral testing is considered an integral part of a complete examination. Limited examinations for reoccurring indications may be performed  as noted. The reflux portion of the exam is performed with the patient in reverse Trendelenburg.  +---------+---------------+---------+-----------+----------+--------------+ RIGHT    CompressibilityPhasicitySpontaneityPropertiesThrombus Aging +---------+---------------+---------+-----------+----------+--------------+ CFV      Full           Yes      Yes                                 +---------+---------------+---------+-----------+----------+--------------+ SFJ      Full                                                        +---------+---------------+---------+-----------+----------+--------------+ FV Prox  Full                                                        +---------+---------------+---------+-----------+----------+--------------+ FV Mid   Full                                                        +---------+---------------+---------+-----------+----------+--------------+ FV DistalFull                                                        +---------+---------------+---------+-----------+----------+--------------+  Culture   Final    NO GROWTH 2 DAYS Performed at [facility], [address] [place], [place] [address]    Report Status PENDING  Incomplete  Culture, blood (Routine X 2) w Reflex to ID Panel     Status: None (Preliminary result)   Collection Time: 02/17/23 12:42 PM   Specimen: BLOOD LEFT HAND  Result Value Ref Range Status   Specimen Description BLOOD LEFT HAND  Final   Special Requests   Final    BOTTLES DRAWN AEROBIC ONLY Blood Culture adequate volume   Culture   Final    NO GROWTH 2 DAYS Performed at [facility], [address] [place], [place] [address]    Report Status PENDING  Incomplete    Radiology Studies: VAS [place] LOWER EXTREMITY VENOUS (DVT)  Result Date: 02/19/2023  Lower Venous DVT Study Patient Name:  [name]  Date of Exam:   02/19/2023 Medical Rec #: [id number]      Accession #:    [id number] Date of Birth: 05-Jun-1970      Patient Gender: F Patient Age:   52 years Exam Location:  [facility] Procedure:      VAS [place] LOWER EXTREMITY VENOUS (DVT) Referring Phys: CORNELIUS VAN DAM --------------------------------------------------------------------------------  Indications: Pulmonary embolism.  Risk Factors: Immobility confirmed PE Surgery laparotomy 02/11/23 past pregnancy. Comparison Study: No significant changes seen since previous exam 02/07/23 Performing Technologist: Shona Simpson  Examination Guidelines: A complete evaluation includes B-mode imaging, spectral Doppler, color Doppler, and power Doppler as needed of all accessible portions of each vessel. Bilateral testing is considered an integral part of a complete examination. Limited examinations for reoccurring indications may be performed  as noted. The reflux portion of the exam is performed with the patient in reverse Trendelenburg.  +---------+---------------+---------+-----------+----------+--------------+ RIGHT    CompressibilityPhasicitySpontaneityPropertiesThrombus Aging +---------+---------------+---------+-----------+----------+--------------+ CFV      Full           Yes      Yes                                 +---------+---------------+---------+-----------+----------+--------------+ SFJ      Full                                                        +---------+---------------+---------+-----------+----------+--------------+ FV Prox  Full                                                        +---------+---------------+---------+-----------+----------+--------------+ FV Mid   Full                                                        +---------+---------------+---------+-----------+----------+--------------+ FV DistalFull                                                        +---------+---------------+---------+-----------+----------+--------------+  upper extremity. Findings consistent with acute deep vein thrombosis involving the right internal jugular vein and right brachial veins.  Left: No evidence of superficial vein thrombosis in the upper extremity. Findings consistent with acute deep vein thrombosis involving the left brachial veins.  *See  table(s) above for measurements and observations.  Diagnosing physician: Sherald Hess MD Electronically signed by Sherald Hess MD on 02/19/2023 at 1:35:26 PM.    Final    ECHO TEE  Result Date: 02/18/2023    TRANSESOPHOGEAL ECHO REPORT   Patient Name:   [name] Date of Exam: 02/18/2023 Medical Rec #:  [id number]     Height:       63.0 in Accession #:    [id number]    Weight:       146.8 lb Date of Birth:  [birth year]     BSA:          1.696 m Patient Age:    51 years      BP:           140/61 mmHg Patient Gender: F             HR:           111 bpm. Exam Location:  Inpatient Procedure: Transesophageal Echo, Cardiac Doppler, Color Doppler and Saline            Contrast Bubble Study Indications:     Candidemia. Splenic infarct.  History:         Patient has prior history of Echocardiogram examinations, most                  recent 02/07/2023. Sepsis and COPD, Arrythmias:Cardiac Arrest;                  Risk Factors:Current Smoker.  Sonographer:     Delcie Roch RDCS Referring Phys:  [id number] Cyndi Bender Diagnosing Phys: Lennie Odor MD PROCEDURE: After discussion of the risks and benefits of a TEE, an informed consent was obtained from the patient. TEE procedure time was 10 minutes. The transesophogeal probe was passed without difficulty through the esophogus of the patient. Imaged were obtained with the patient in a left lateral decubitus position. Sedation performed by different physician. The patient was monitored while under deep sedation. Anesthestetic sedation was provided intravenously by Anesthesiology: 114mg  of Propofol. Image quality was excellent. The patient's vital signs; including heart rate, blood pressure, and oxygen saturation; remained stable throughout the procedure. The patient developed no complications during the procedure.  IMPRESSIONS  1. Negative TEE for endocarditis.  2. Left ventricular ejection fraction, by estimation, is 60 to 65%. The left ventricle has normal  function.  3. Right ventricular systolic function is normal. The right ventricular size is normal.  4. No left atrial/left atrial appendage thrombus was detected.  5. Moderate pericardial effusion. The pericardial effusion is anterior to the right ventricle.  6. The mitral valve is grossly normal. Trivial mitral valve regurgitation. No evidence of mitral stenosis.  7. The aortic valve is tricuspid. Aortic valve regurgitation is not visualized. No aortic stenosis is present.  8. Agitated saline contrast bubble study was negative, with no evidence of any interatrial shunt. Conclusion(s)/Recommendation(s): No evidence of vegetation/infective endocarditis on this transesophageael echocardiogram. FINDINGS  Left Ventricle: Left ventricular ejection fraction, by estimation, is 60 to 65%. The left ventricle has normal function. The left ventricular internal cavity size was normal in size. Right Ventricle: The right ventricular size is normal. No increase in right ventricular wall thickness.  upper extremity. Findings consistent with acute deep vein thrombosis involving the right internal jugular vein and right brachial veins.  Left: No evidence of superficial vein thrombosis in the upper extremity. Findings consistent with acute deep vein thrombosis involving the left brachial veins.  *See  table(s) above for measurements and observations.  Diagnosing physician: Sherald Hess MD Electronically signed by Sherald Hess MD on 02/19/2023 at 1:35:26 PM.    Final    ECHO TEE  Result Date: 02/18/2023    TRANSESOPHOGEAL ECHO REPORT   Patient Name:   [name] Date of Exam: 02/18/2023 Medical Rec #:  [id number]     Height:       63.0 in Accession #:    [id number]    Weight:       146.8 lb Date of Birth:  [birth year]     BSA:          1.696 m Patient Age:    51 years      BP:           140/61 mmHg Patient Gender: F             HR:           111 bpm. Exam Location:  Inpatient Procedure: Transesophageal Echo, Cardiac Doppler, Color Doppler and Saline            Contrast Bubble Study Indications:     Candidemia. Splenic infarct.  History:         Patient has prior history of Echocardiogram examinations, most                  recent 02/07/2023. Sepsis and COPD, Arrythmias:Cardiac Arrest;                  Risk Factors:Current Smoker.  Sonographer:     Delcie Roch RDCS Referring Phys:  [id number] Cyndi Bender Diagnosing Phys: Lennie Odor MD PROCEDURE: After discussion of the risks and benefits of a TEE, an informed consent was obtained from the patient. TEE procedure time was 10 minutes. The transesophogeal probe was passed without difficulty through the esophogus of the patient. Imaged were obtained with the patient in a left lateral decubitus position. Sedation performed by different physician. The patient was monitored while under deep sedation. Anesthestetic sedation was provided intravenously by Anesthesiology: 114mg  of Propofol. Image quality was excellent. The patient's vital signs; including heart rate, blood pressure, and oxygen saturation; remained stable throughout the procedure. The patient developed no complications during the procedure.  IMPRESSIONS  1. Negative TEE for endocarditis.  2. Left ventricular ejection fraction, by estimation, is 60 to 65%. The left ventricle has normal  function.  3. Right ventricular systolic function is normal. The right ventricular size is normal.  4. No left atrial/left atrial appendage thrombus was detected.  5. Moderate pericardial effusion. The pericardial effusion is anterior to the right ventricle.  6. The mitral valve is grossly normal. Trivial mitral valve regurgitation. No evidence of mitral stenosis.  7. The aortic valve is tricuspid. Aortic valve regurgitation is not visualized. No aortic stenosis is present.  8. Agitated saline contrast bubble study was negative, with no evidence of any interatrial shunt. Conclusion(s)/Recommendation(s): No evidence of vegetation/infective endocarditis on this transesophageael echocardiogram. FINDINGS  Left Ventricle: Left ventricular ejection fraction, by estimation, is 60 to 65%. The left ventricle has normal function. The left ventricular internal cavity size was normal in size. Right Ventricle: The right ventricular size is normal. No increase in right ventricular wall thickness.  Culture   Final    NO GROWTH 2 DAYS Performed at [facility], [address] [place], [place] [address]    Report Status PENDING  Incomplete  Culture, blood (Routine X 2) w Reflex to ID Panel     Status: None (Preliminary result)   Collection Time: 02/17/23 12:42 PM   Specimen: BLOOD LEFT HAND  Result Value Ref Range Status   Specimen Description BLOOD LEFT HAND  Final   Special Requests   Final    BOTTLES DRAWN AEROBIC ONLY Blood Culture adequate volume   Culture   Final    NO GROWTH 2 DAYS Performed at [facility], [address] [place], [place] [address]    Report Status PENDING  Incomplete    Radiology Studies: VAS [place] LOWER EXTREMITY VENOUS (DVT)  Result Date: 02/19/2023  Lower Venous DVT Study Patient Name:  [name]  Date of Exam:   02/19/2023 Medical Rec #: [id number]      Accession #:    [id number] Date of Birth: 05-Jun-1970      Patient Gender: F Patient Age:   52 years Exam Location:  [facility] Procedure:      VAS [place] LOWER EXTREMITY VENOUS (DVT) Referring Phys: CORNELIUS VAN DAM --------------------------------------------------------------------------------  Indications: Pulmonary embolism.  Risk Factors: Immobility confirmed PE Surgery laparotomy 02/11/23 past pregnancy. Comparison Study: No significant changes seen since previous exam 02/07/23 Performing Technologist: Shona Simpson  Examination Guidelines: A complete evaluation includes B-mode imaging, spectral Doppler, color Doppler, and power Doppler as needed of all accessible portions of each vessel. Bilateral testing is considered an integral part of a complete examination. Limited examinations for reoccurring indications may be performed  as noted. The reflux portion of the exam is performed with the patient in reverse Trendelenburg.  +---------+---------------+---------+-----------+----------+--------------+ RIGHT    CompressibilityPhasicitySpontaneityPropertiesThrombus Aging +---------+---------------+---------+-----------+----------+--------------+ CFV      Full           Yes      Yes                                 +---------+---------------+---------+-----------+----------+--------------+ SFJ      Full                                                        +---------+---------------+---------+-----------+----------+--------------+ FV Prox  Full                                                        +---------+---------------+---------+-----------+----------+--------------+ FV Mid   Full                                                        +---------+---------------+---------+-----------+----------+--------------+ FV DistalFull                                                        +---------+---------------+---------+-----------+----------+--------------+  upper extremity. Findings consistent with acute deep vein thrombosis involving the right internal jugular vein and right brachial veins.  Left: No evidence of superficial vein thrombosis in the upper extremity. Findings consistent with acute deep vein thrombosis involving the left brachial veins.  *See  table(s) above for measurements and observations.  Diagnosing physician: Sherald Hess MD Electronically signed by Sherald Hess MD on 02/19/2023 at 1:35:26 PM.    Final    ECHO TEE  Result Date: 02/18/2023    TRANSESOPHOGEAL ECHO REPORT   Patient Name:   [name] Date of Exam: 02/18/2023 Medical Rec #:  [id number]     Height:       63.0 in Accession #:    [id number]    Weight:       146.8 lb Date of Birth:  [birth year]     BSA:          1.696 m Patient Age:    51 years      BP:           140/61 mmHg Patient Gender: F             HR:           111 bpm. Exam Location:  Inpatient Procedure: Transesophageal Echo, Cardiac Doppler, Color Doppler and Saline            Contrast Bubble Study Indications:     Candidemia. Splenic infarct.  History:         Patient has prior history of Echocardiogram examinations, most                  recent 02/07/2023. Sepsis and COPD, Arrythmias:Cardiac Arrest;                  Risk Factors:Current Smoker.  Sonographer:     Delcie Roch RDCS Referring Phys:  [id number] Cyndi Bender Diagnosing Phys: Lennie Odor MD PROCEDURE: After discussion of the risks and benefits of a TEE, an informed consent was obtained from the patient. TEE procedure time was 10 minutes. The transesophogeal probe was passed without difficulty through the esophogus of the patient. Imaged were obtained with the patient in a left lateral decubitus position. Sedation performed by different physician. The patient was monitored while under deep sedation. Anesthestetic sedation was provided intravenously by Anesthesiology: 114mg  of Propofol. Image quality was excellent. The patient's vital signs; including heart rate, blood pressure, and oxygen saturation; remained stable throughout the procedure. The patient developed no complications during the procedure.  IMPRESSIONS  1. Negative TEE for endocarditis.  2. Left ventricular ejection fraction, by estimation, is 60 to 65%. The left ventricle has normal  function.  3. Right ventricular systolic function is normal. The right ventricular size is normal.  4. No left atrial/left atrial appendage thrombus was detected.  5. Moderate pericardial effusion. The pericardial effusion is anterior to the right ventricle.  6. The mitral valve is grossly normal. Trivial mitral valve regurgitation. No evidence of mitral stenosis.  7. The aortic valve is tricuspid. Aortic valve regurgitation is not visualized. No aortic stenosis is present.  8. Agitated saline contrast bubble study was negative, with no evidence of any interatrial shunt. Conclusion(s)/Recommendation(s): No evidence of vegetation/infective endocarditis on this transesophageael echocardiogram. FINDINGS  Left Ventricle: Left ventricular ejection fraction, by estimation, is 60 to 65%. The left ventricle has normal function. The left ventricular internal cavity size was normal in size. Right Ventricle: The right ventricular size is normal. No increase in right ventricular wall thickness.  upper extremity. Findings consistent with acute deep vein thrombosis involving the right internal jugular vein and right brachial veins.  Left: No evidence of superficial vein thrombosis in the upper extremity. Findings consistent with acute deep vein thrombosis involving the left brachial veins.  *See  table(s) above for measurements and observations.  Diagnosing physician: Sherald Hess MD Electronically signed by Sherald Hess MD on 02/19/2023 at 1:35:26 PM.    Final    ECHO TEE  Result Date: 02/18/2023    TRANSESOPHOGEAL ECHO REPORT   Patient Name:   [name] Date of Exam: 02/18/2023 Medical Rec #:  [id number]     Height:       63.0 in Accession #:    [id number]    Weight:       146.8 lb Date of Birth:  [birth year]     BSA:          1.696 m Patient Age:    51 years      BP:           140/61 mmHg Patient Gender: F             HR:           111 bpm. Exam Location:  Inpatient Procedure: Transesophageal Echo, Cardiac Doppler, Color Doppler and Saline            Contrast Bubble Study Indications:     Candidemia. Splenic infarct.  History:         Patient has prior history of Echocardiogram examinations, most                  recent 02/07/2023. Sepsis and COPD, Arrythmias:Cardiac Arrest;                  Risk Factors:Current Smoker.  Sonographer:     Delcie Roch RDCS Referring Phys:  [id number] Cyndi Bender Diagnosing Phys: Lennie Odor MD PROCEDURE: After discussion of the risks and benefits of a TEE, an informed consent was obtained from the patient. TEE procedure time was 10 minutes. The transesophogeal probe was passed without difficulty through the esophogus of the patient. Imaged were obtained with the patient in a left lateral decubitus position. Sedation performed by different physician. The patient was monitored while under deep sedation. Anesthestetic sedation was provided intravenously by Anesthesiology: 114mg  of Propofol. Image quality was excellent. The patient's vital signs; including heart rate, blood pressure, and oxygen saturation; remained stable throughout the procedure. The patient developed no complications during the procedure.  IMPRESSIONS  1. Negative TEE for endocarditis.  2. Left ventricular ejection fraction, by estimation, is 60 to 65%. The left ventricle has normal  function.  3. Right ventricular systolic function is normal. The right ventricular size is normal.  4. No left atrial/left atrial appendage thrombus was detected.  5. Moderate pericardial effusion. The pericardial effusion is anterior to the right ventricle.  6. The mitral valve is grossly normal. Trivial mitral valve regurgitation. No evidence of mitral stenosis.  7. The aortic valve is tricuspid. Aortic valve regurgitation is not visualized. No aortic stenosis is present.  8. Agitated saline contrast bubble study was negative, with no evidence of any interatrial shunt. Conclusion(s)/Recommendation(s): No evidence of vegetation/infective endocarditis on this transesophageael echocardiogram. FINDINGS  Left Ventricle: Left ventricular ejection fraction, by estimation, is 60 to 65%. The left ventricle has normal function. The left ventricular internal cavity size was normal in size. Right Ventricle: The right ventricular size is normal. No increase in right ventricular wall thickness.  PROGRESS NOTE    [name]  WUJ:[id number] DOB: 09/21/1970 DOA: 01/27/2023 PCP: [name], PA-C   Brief Narrative:  This is a 52 year old female with past medical history of COPD, Crohn's on Skyrizi, hypertension, type 2 diabetes mellitus who initially presented to the after being found unresponsive. Per family, patient has had bloody emesis with diarrhea over the past few days. Initial systolics in the 60s in the ED, and patient was minimally responsive. In the emergency room, patient was intubated, and patient was admitted to the ICU for further evaluation and management for shock and altered mental status.    01/27/2023: Admitted to ICU and intubated.  Felt to have septic shock of unclear source which resolved antibiotic therapy and fluid resuscitation.  Suspicion was that she may have had C. difficile colitis despite negative toxin.  She was treated with vancomycin. 9/29 Off pressors 10/2: Extubated 10/3 transfer to floor. 10/10 underwent repeat proctosigmoidoscopy for ongoing diarrhea.  Diffuse severe inflammation identified due to combination of Crohn's disease, active C. difficile infection and possible ischemic colitis from prior hypotension on presentation. 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  Intraoperative findings include frank necrosis with pelvic abscess formation and significant colitis. 10/12 some dilated small bowel with minimal OG output.  Continued observation advised.  Having significant postoperative pain. 10/13 became tachycardic and hypotensive.  More lethargic.  Transferred back to ICU. 10/15 blood cultures reports fungemia, resume PO vancomycin 10/17 CT abdomen pelvis notable for septic emboli - CTA chest on hold given IV access issues 10/18 TEE negative for endocarditis/bubble study - NG out for TEE probe.  Advance diet, hoping to avoid replacement of NG tube 10/19 wound VAC dysfunctional, surgery switching to wet-to-dry over the  weekend -pain poorly controlled today, add oxycodone (medication lists this is an allergy but patient verbally approved) 10/20 pain control improving, transition from IV fentanyl to Dilaudid, continue p.o. oxycodone, restart home anxiolytics with Xanax   Assessment & Plan:   Principal Problem:   Fungemia Active Problems:   Metabolic encephalopathy   Shock (HCC)   Cardiac arrest (HCC)   Acute respiratory failure with hypoxia (HCC)   Upper GI bleed   Granulomatous colitis (HCC)   Pseudomembranous colitis   Crohn's disease without complication (HCC)   Ileus (HCC)   C. difficile diarrhea   Sepsis (HCC)   Pressure injury of skin   Hyponatremia   Severe protein-calorie malnutrition (HCC)   ABLA (acute blood loss anemia)   Pulmonary embolus (HCC)   Splenic infarct   Septic shock, multifactorial, POA Fungemia, candida glabrata Cdiff Colitis Septic emboli, thrombocytosis -Cultures reporting fungemia, fulminant C. difficile colitis and intra-abdominal abscess also notable sources for infection -ID following, appreciate insight recommendations - continue micafungin, vancomycin, ceftriaxone, Flagyl -Status post colectomy -CT abdomen pelvis shows splenic and pulmonary emboli -Unable to place PICC/central line due to bilateral upper extremity DVT - Christine consult IR to see if this can be done safely under fluoro -DVT studies + for DVT of both upper extremities; BLE negative -TEE 10/18 negative for vegetations, bubble study negative **ID recommending neurology consult Monday the 21st for possible dilated funduscopic exam to rule out fungal endophthalmitis   Infectious colitis due to C. difficile on treatment -See above -High output ostomy, continue to supplement p.o. and IV hydration as appropriate  Intractable pain, multifactorial -Combination of postoperative pain, colitis, septic emboli as well as fungemia -Moderate improvement with initiation of p.o. oxycodone, allergy for oxycodone  deleted out of patient's chart per our  POP      Full           Yes      Yes                                 +---------+---------------+---------+-----------+----------+--------------+ PTV      Full                                                        +---------+---------------+---------+-----------+----------+--------------+ PERO     Full                                                        +---------+---------------+---------+-----------+----------+--------------+     Summary: BILATERAL: - No evidence of deep vein thrombosis seen in the lower extremities, bilaterally. -No evidence of popliteal cyst, bilaterally.   *See table(s) above for measurements and observations. Electronically signed by Sherald Hess MD on 02/19/2023 at 1:35:43 PM.    Final    VAS [place] UPPER EXTREMITY VENOUS DUPLEX  Result Date: 02/19/2023 UPPER VENOUS STUDY  Patient Name:  [name]  Date of Exam:   02/19/2023 Medical Rec #: [id number]      Accession #:    [id number] Date of Birth: July 13, 1970      Patient Gender: F Patient Age:   29 years Exam Location:  [facility]  Procedure:      VAS [place] UPPER EXTREMITY VENOUS DUPLEX Referring Phys: CORNELIUS VAN DAM --------------------------------------------------------------------------------  Indications: pulmonary embolism Risk Factors: Immobility confirmed PE Surgery laparotomy 02/11/23 past pregnancy. Limitations: Bandages, line and patient positioning. Comparison Study: Significant changes seen since prior exam 12/30/21 Performing Technologist: Shona Simpson  Examination Guidelines: A complete evaluation includes B-mode imaging, spectral Doppler, color Doppler, and power Doppler as needed of all accessible portions of each vessel. Bilateral testing is considered an integral part of a complete examination. Limited examinations for reoccurring indications may be performed as noted.  Right Findings: +----------+------------+---------+-----------+-------------------+-------+ RIGHT     CompressiblePhasicitySpontaneous    Properties     Summary +----------+------------+---------+-----------+-------------------+-------+ IJV           None       No        No     rigid w/compression Acute  +----------+------------+---------+-----------+-------------------+-------+ Subclavian    Full       Yes       Yes                               +----------+------------+---------+-----------+-------------------+-------+ Axillary      Full       Yes       Yes                               +----------+------------+---------+-----------+-------------------+-------+ Brachial    Partial      Yes       Yes    brightly echogenic  Acute  +----------+------------+---------+-----------+-------------------+-------+ Radial        Full       Yes  PROGRESS NOTE    [name]  WUJ:[id number] DOB: 09/21/1970 DOA: 01/27/2023 PCP: [name], PA-C   Brief Narrative:  This is a 52 year old female with past medical history of COPD, Crohn's on Skyrizi, hypertension, type 2 diabetes mellitus who initially presented to the after being found unresponsive. Per family, patient has had bloody emesis with diarrhea over the past few days. Initial systolics in the 60s in the ED, and patient was minimally responsive. In the emergency room, patient was intubated, and patient was admitted to the ICU for further evaluation and management for shock and altered mental status.    01/27/2023: Admitted to ICU and intubated.  Felt to have septic shock of unclear source which resolved antibiotic therapy and fluid resuscitation.  Suspicion was that she may have had C. difficile colitis despite negative toxin.  She was treated with vancomycin. 9/29 Off pressors 10/2: Extubated 10/3 transfer to floor. 10/10 underwent repeat proctosigmoidoscopy for ongoing diarrhea.  Diffuse severe inflammation identified due to combination of Crohn's disease, active C. difficile infection and possible ischemic colitis from prior hypotension on presentation. 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  Intraoperative findings include frank necrosis with pelvic abscess formation and significant colitis. 10/12 some dilated small bowel with minimal OG output.  Continued observation advised.  Having significant postoperative pain. 10/13 became tachycardic and hypotensive.  More lethargic.  Transferred back to ICU. 10/15 blood cultures reports fungemia, resume PO vancomycin 10/17 CT abdomen pelvis notable for septic emboli - CTA chest on hold given IV access issues 10/18 TEE negative for endocarditis/bubble study - NG out for TEE probe.  Advance diet, hoping to avoid replacement of NG tube 10/19 wound VAC dysfunctional, surgery switching to wet-to-dry over the  weekend -pain poorly controlled today, add oxycodone (medication lists this is an allergy but patient verbally approved) 10/20 pain control improving, transition from IV fentanyl to Dilaudid, continue p.o. oxycodone, restart home anxiolytics with Xanax   Assessment & Plan:   Principal Problem:   Fungemia Active Problems:   Metabolic encephalopathy   Shock (HCC)   Cardiac arrest (HCC)   Acute respiratory failure with hypoxia (HCC)   Upper GI bleed   Granulomatous colitis (HCC)   Pseudomembranous colitis   Crohn's disease without complication (HCC)   Ileus (HCC)   C. difficile diarrhea   Sepsis (HCC)   Pressure injury of skin   Hyponatremia   Severe protein-calorie malnutrition (HCC)   ABLA (acute blood loss anemia)   Pulmonary embolus (HCC)   Splenic infarct   Septic shock, multifactorial, POA Fungemia, candida glabrata Cdiff Colitis Septic emboli, thrombocytosis -Cultures reporting fungemia, fulminant C. difficile colitis and intra-abdominal abscess also notable sources for infection -ID following, appreciate insight recommendations - continue micafungin, vancomycin, ceftriaxone, Flagyl -Status post colectomy -CT abdomen pelvis shows splenic and pulmonary emboli -Unable to place PICC/central line due to bilateral upper extremity DVT - Christine consult IR to see if this can be done safely under fluoro -DVT studies + for DVT of both upper extremities; BLE negative -TEE 10/18 negative for vegetations, bubble study negative **ID recommending neurology consult Monday the 21st for possible dilated funduscopic exam to rule out fungal endophthalmitis   Infectious colitis due to C. difficile on treatment -See above -High output ostomy, continue to supplement p.o. and IV hydration as appropriate  Intractable pain, multifactorial -Combination of postoperative pain, colitis, septic emboli as well as fungemia -Moderate improvement with initiation of p.o. oxycodone, allergy for oxycodone  deleted out of patient's chart per our

## 2023-02-20 NOTE — Progress Notes (Signed)
Secure chat sent to Dr Natale Milch re unable to place PICC in either arm due to vascular ultrasound results of BUE DVT's.

## 2023-02-21 ENCOUNTER — Inpatient Hospital Stay (HOSPITAL_COMMUNITY): Payer: Medicaid Other

## 2023-02-21 ENCOUNTER — Encounter (HOSPITAL_COMMUNITY): Payer: Self-pay | Admitting: Cardiovascular Disease

## 2023-02-21 DIAGNOSIS — D72825 Bandemia: Secondary | ICD-10-CM | POA: Diagnosis not present

## 2023-02-21 DIAGNOSIS — D75839 Thrombocytosis, unspecified: Secondary | ICD-10-CM | POA: Diagnosis not present

## 2023-02-21 DIAGNOSIS — A0472 Enterocolitis due to Clostridium difficile, not specified as recurrent: Secondary | ICD-10-CM | POA: Diagnosis not present

## 2023-02-21 DIAGNOSIS — K567 Ileus, unspecified: Secondary | ICD-10-CM | POA: Diagnosis not present

## 2023-02-21 DIAGNOSIS — B49 Unspecified mycosis: Secondary | ICD-10-CM | POA: Diagnosis not present

## 2023-02-21 DIAGNOSIS — K5010 Crohn's disease of large intestine without complications: Secondary | ICD-10-CM | POA: Diagnosis not present

## 2023-02-21 HISTORY — PX: IR FLUORO GUIDE CV LINE LEFT: IMG2282

## 2023-02-21 HISTORY — PX: IR US GUIDE VASC ACCESS LEFT: IMG2389

## 2023-02-21 LAB — GLUCOSE, CAPILLARY
Glucose-Capillary: 100 mg/dL — ABNORMAL HIGH (ref 70–99)
Glucose-Capillary: 100 mg/dL — ABNORMAL HIGH (ref 70–99)
Glucose-Capillary: 109 mg/dL — ABNORMAL HIGH (ref 70–99)
Glucose-Capillary: 112 mg/dL — ABNORMAL HIGH (ref 70–99)
Glucose-Capillary: 117 mg/dL — ABNORMAL HIGH (ref 70–99)
Glucose-Capillary: 91 mg/dL (ref 70–99)

## 2023-02-21 LAB — CBC
HCT: 30.6 % — ABNORMAL LOW (ref 36.0–46.0)
Hemoglobin: 9.7 g/dL — ABNORMAL LOW (ref 12.0–15.0)
MCH: 27.2 pg (ref 26.0–34.0)
MCHC: 31.7 g/dL (ref 30.0–36.0)
MCV: 86 fL (ref 80.0–100.0)
Platelets: 1389 10*3/uL (ref 150–400)
RBC: 3.56 MIL/uL — ABNORMAL LOW (ref 3.87–5.11)
RDW: 15.9 % — ABNORMAL HIGH (ref 11.5–15.5)
WBC: 32.4 10*3/uL — ABNORMAL HIGH (ref 4.0–10.5)
nRBC: 0.1 % (ref 0.0–0.2)

## 2023-02-21 LAB — C-REACTIVE PROTEIN: CRP: 18.9 mg/dL — ABNORMAL HIGH (ref <1.0)

## 2023-02-21 LAB — SEDIMENTATION RATE: Sed Rate: 123 mm/h — ABNORMAL HIGH (ref 0–22)

## 2023-02-21 LAB — IRON AND TIBC
Iron: 11 ug/dL — ABNORMAL LOW (ref 28–170)
Saturation Ratios: 7 % — ABNORMAL LOW (ref 10.4–31.8)
TIBC: 169 ug/dL — ABNORMAL LOW (ref 250–450)
UIBC: 158 ug/dL

## 2023-02-21 LAB — HEPARIN LEVEL (UNFRACTIONATED)
Heparin Unfractionated: 0.86 [IU]/mL — ABNORMAL HIGH (ref 0.30–0.70)
Heparin Unfractionated: 0.47 [IU]/mL (ref 0.30–0.70)

## 2023-02-21 LAB — FERRITIN: Ferritin: 336 ng/mL — ABNORMAL HIGH (ref 11–307)

## 2023-02-21 MED ORDER — ENSURE ENLIVE PO LIQD
237.0000 mL | Freq: Two times a day (BID) | ORAL | Status: DC
  Administered 2023-02-21 – 2023-02-22 (×2): 237 mL via ORAL

## 2023-02-21 MED ORDER — LIDOCAINE HCL 1 % IJ SOLN
20.0000 mL | Freq: Once | INTRAMUSCULAR | Status: DC
  Filled 2023-02-21: qty 20

## 2023-02-21 MED ORDER — LIDOCAINE HCL 1 % IJ SOLN
INTRAMUSCULAR | Status: AC
  Filled 2023-02-21: qty 20

## 2023-02-21 MED ORDER — LIDOCAINE-EPINEPHRINE 1 %-1:100000 IJ SOLN
INTRAMUSCULAR | Status: AC
  Filled 2023-02-21: qty 1

## 2023-02-21 NOTE — Procedures (Signed)
Vascular and Interventional Radiology Procedure Note  Patient: [name] DOB: 08-17-1970 Medical Record Number: [id number] Note Date/Time: 02/21/23 2:53 PM   Performing Physician: Roanna Banning, MD Assistant(s): None  Diagnosis: Poor IV access  Procedure: TUNNELED CENTRAL VENOUS "Powerline" CATHETER PLACEMENT  Anesthesia: Local Anesthetic Complications: None Estimated Blood Loss: Minimal Specimens:  None  Findings:  Aborted attempts at RUE PICC placement. Successful placement of left-sided, 23 cm (tip-to-cuff), tunneled catheter with the tip of the catheter in the proximal right atrium.  Plan: Catheter ready for use.  See detailed procedure note with images in PACS. The patient tolerated the procedure well without incident or complication and was returned to Recovery in stable condition.    Roanna Banning, MD [facility] Radiology   Pager. [phone] Clinic. [phone]

## 2023-02-21 NOTE — Progress Notes (Signed)
PHARMACY - ANTICOAGULATION CONSULT NOTE  Pharmacy Consult for heparin Indication: pulmonary embolus (no boluses per surgery due to recent colectomy)  Allergies  Allergen Reactions   Codeine Shortness Of Breath, Swelling and Rash    Throat swelling   Dilaudid [Hydromorphone Hcl] Shortness Of Breath and Swelling   Hydrocodone Shortness Of Breath, Swelling and Rash   Ketorolac Nausea And Vomiting and Swelling    Pt reports n/v and swelling to throat   Morphine And Codeine Shortness Of Breath, Swelling and Rash   Penicillins Shortness Of Breath, Swelling and Other (See Comments)    Throat swells 02/06/21--TOLERATES CEFTRIAXONE     Nickel Rash   Pregabalin Other (See Comments)   Acetaminophen     Per MD patient states she can't take Tylenol because of liver enzymes   Ativan [Lorazepam] Other (See Comments)    Migraines.   Strawberry Extract Swelling   Watermelon Concentrate Swelling   Albuterol Palpitations   Benadryl [Diphenhydramine Hcl] Palpitations   Humira [Adalimumab] Rash   Latex Rash   Remicade [Infliximab] Rash    Blisters and Welts    Tramadol Rash    Rash and itching    Patient Measurements: Height: 5' 2.99" (160 cm) Weight: 65 kg (143 lb 4.8 oz) IBW/kg (Calculated) : 52.38 Heparin Dosing Weight: 68.8  Vital Signs: Temp: 97.9 F (36.6 C) (10/20 2025) Temp Source: Oral (10/20 2025) BP: 115/52 (10/20 2025) Pulse Rate: 108 (10/20 2113)  Labs: Recent Labs    02/18/23 0539 02/19/23 0359 02/20/23 0341 02/20/23 1116 02/20/23 1803 02/21/23 0038  HGB 9.8* 9.6* 10.0*  --   --   --   HCT 30.8* 30.3* 31.1*  --   --   --   PLT 556* 1,103* 1,097*  --   --   --   HEPARINUNFRC 0.45 0.53 0.22* 0.24* 0.56 0.86*  CREATININE 0.45 0.51 0.41*  --   --   --     Estimated Creatinine Clearance: 73.7 mL/min (A) (by C-G formula based on SCr of 0.41 mg/dL (L)).   Assessment: Patient with PMH of COPD, HTN, Crohn's, DM, GERD, asthma, anxiety, head injury, IBS, PTSD, PSVT,  and seizure admitted on 9/26 after being found down at home with bloody emesis. She underwent CRP x 5 min with ROSC. She underwent a flexible sigmoidoscopy-she was found to have changes consistent with pseudomembranous colitis. 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 On CT AP 10/16 there was noted splenic infarcts and pulm emboli. Pharmacy consulted on 10/16 to dose heparin without boluses due to recent colectomy.   Heparin level now elevated on confirmatory check, no bleeding issues per RN.  Goal of Therapy:  Heparin level 0.3-0.7 units/ml Monitor platelets by anticoagulation protocol: Yes   Plan:  Reduce heparin to 2050 units/h F/u 6 hr heparin level   Fredonia Highland, PharmD, BCPS, [facility] Clinical Pharmacist Please check AMION for all [facility] Pharmacy numbers 02/21/2023

## 2023-02-21 NOTE — Progress Notes (Signed)
PHARMACY - ANTICOAGULATION CONSULT NOTE  Pharmacy Consult for heparin Indication: pulmonary embolus (no boluses per surgery due to recent colectomy)  Allergies  Allergen Reactions   Codeine Shortness Of Breath, Swelling and Rash    Throat swelling   Dilaudid [Hydromorphone Hcl] Shortness Of Breath and Swelling   Hydrocodone Shortness Of Breath, Swelling and Rash   Ketorolac Nausea And Vomiting and Swelling    Pt reports n/v and swelling to throat   Morphine And Codeine Shortness Of Breath, Swelling and Rash   Penicillins Shortness Of Breath, Swelling and Other (See Comments)    Throat swells 02/06/21--TOLERATES CEFTRIAXONE     Nickel Rash   Pregabalin Other (See Comments)   Acetaminophen     Per MD patient states she can't take Tylenol because of liver enzymes   Ativan [Lorazepam] Other (See Comments)    Migraines.   Strawberry Extract Swelling   Watermelon Concentrate Swelling   Albuterol Palpitations   Benadryl [Diphenhydramine Hcl] Palpitations   Humira [Adalimumab] Rash   Latex Rash   Remicade [Infliximab] Rash    Blisters and Welts    Tramadol Rash    Rash and itching    Patient Measurements: Height: 5' 2.99" (160 cm) Weight: 64.8 kg (142 lb 13.7 oz) IBW/kg (Calculated) : 52.38 Heparin Dosing Weight: 68.8  Vital Signs: Temp: 98 F (36.7 C) (10/21 1155) Temp Source: Oral (10/21 1155) BP: 125/50 (10/21 1155) Pulse Rate: 113 (10/21 0650)  Labs: Recent Labs    02/19/23 0359 02/20/23 0341 02/20/23 1116 02/20/23 1803 02/21/23 0038 02/21/23 0326 02/21/23 0800  HGB 9.6* 10.0*  --   --   --  9.7*  --   HCT 30.3* 31.1*  --   --   --  30.6*  --   PLT 1,103* 1,097*  --   --   --  1,389*  --   HEPARINUNFRC 0.53 0.22*   < > 0.56 0.86*  --  0.47  CREATININE 0.51 0.41*  --   --   --   --   --    < > = values in this interval not displayed.    Estimated Creatinine Clearance: 73.7 mL/min (A) (by C-G formula based on SCr of 0.41 mg/dL  (L)).   Assessment: Patient with PMH of COPD, HTN, Crohn's, DM, GERD, asthma, anxiety, head injury, IBS, PTSD, PSVT, and seizure admitted on 9/26 after being found down at home with bloody emesis. She underwent CRP x 5 min with ROSC. She underwent a flexible sigmoidoscopy-she was found to have changes consistent with pseudomembranous colitis. 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 On CT AP 10/16 there was noted splenic infarcts and pulm emboli. Pharmacy consulted on 10/16 to dose heparin without boluses due to recent colectomy.   Heparin came back therapeutic today. Cont current rate and check in AM.   Goal of Therapy:  Heparin level 0.3-0.7 units/ml Monitor platelets by anticoagulation protocol: Yes   Plan:  Cont heparin to 2050 units/h Daily heparin level   Ulyses Southward, PharmD, [place], AAHIVP, CPP Infectious Disease Pharmacist 02/21/2023 3:34 PM

## 2023-02-21 NOTE — Progress Notes (Signed)
Platelets critical level 1,389, previous value 1,097. Opyd, MD notified.

## 2023-02-21 NOTE — Consult Note (Signed)
[facility]  Telephone:(336) [phone]   HEMATOLOGY/ONCOLOGY IN-PATIENT CONSULTATION NOTE   PATIENT NAME: [name]   MR#: [id number] DOB: [birth year] CSN#: [id number]   DATE OF SERVICE: 02/21/2023  Requesting Physician: [facility]   Patient Care Team: [name], Cordelia Poche as PCP - General (Physician Assistant) Jonelle Sidle, MD as PCP - Cardiology (Cardiology) Rothbart, Gerrit Friends, MD (Cardiology) Corbin Ade, MD (Gastroenterology)  REASON FOR CONSULTATION:  Thrombocytosis, leukocytosis for further evaluation  HISTORY OF PRESENT ILLNESS  [name] is a 52 y.o. lady with past medical history of Crohn's disease on Skyrizi, COPD, hypertension, diabetes mellitus type 2, initially presented to the hospital on 01/27/2023 after being found unresponsive.  Per patient's family, she had bloody emesis with diarrhea over few days prior to presentation.  Initially SBP was 60s in the ED and patient was minimally responsive.  Patient was intubated and admitted to ICU for shock.  She was felt to have septic shock of unclear source.  On 02/10/2023, she underwent proctosigmoidoscopy for ongoing diarrhea which showed diffuse severe inflammation identified due to combination of Crohn's disease, active C. difficile infection and possible ischemic colitis.  On 02/11/2023 she underwent total abdominal colectomy with ileostomy.  Intraoperative findings included frank necrosis with pelvic abscess formation and significant colitis.  Later on 02/13/2023 he became tachycardic, hypotensive, lethargic and had to be transferred back to ICU.  Blood cultures came back positive for fungemia.  There was concern for septic emboli on CT abdomen pelvis.  She was also noted to have bilateral upper extremity DVT.  Hence she was started on heparin drip and continued on antibiotics.  Over the last few days, her platelet count has been steadily increasing and it was 1.09 million on 02/20/2023  and 1.38 million today.  White count 32,400.  Given progressive thrombocytosis, we were consulted for further evaluation.  Patient seen and evaluated. Recovering slowly from recent surgical procedures. Tolerating oral intake.   MEDICAL HISTORY Past Medical History:  Diagnosis Date   Anxiety    Asthma    Chronic low back pain    Collagen vascular disease (HCC)    COPD (chronic obstructive pulmonary disease) (HCC)    Crohn's disease (HCC)    GERD (gastroesophageal reflux disease)    EGD 01/2007 by Dr.Rourke small hiatal hernia s/p 56 french maloney    History of head injury    Hypertension    IBS (irritable bowel syndrome)    PSVT (paroxysmal supraventricular tachycardia) (HCC)    PTSD (post-traumatic stress disorder)    Recurrent chest pain    Seizure disorder (HCC)    Type 2 diabetes mellitus (HCC)      SURGICAL HISTORY Past Surgical History:  Procedure Laterality Date   ABDOMINAL HYSTERECTOMY     with right salpingo oophorectomy 2005   APPENDECTOMY  2006   BALLOON DILATION N/A 02/19/2021   Procedure: BALLOON DILATION;  Surgeon: Lanelle Bal, DO;  [facility];  Service: Endoscopy;  Laterality: N/A;  Sigmoid colon stricture   BIOPSY  02/06/2021   Procedure: BIOPSY;  Surgeon: Lanelle Bal, DO;  [facility]: [facility];  Service: Endoscopy;;   BIOPSY  02/19/2021   Procedure: BIOPSY;  Surgeon: Lanelle Bal, DO;  [facility];  Service: Endoscopy;;   BIOPSY  07/15/2021   Procedure: BIOPSY;  Surgeon: Malissa Hippo, MD;  [facility];  Service: Endoscopy;;   CESAREAN SECTION     1996   CHOLECYSTECTOMY  PFV      Full                                                        +---------+---------------+---------+-----------+----------+--------------+ POP      Full           Yes      Yes                                 +---------+---------------+---------+-----------+----------+--------------+ PTV      Full                                                        +---------+---------------+---------+-----------+----------+--------------+ PERO     Full                                                        +---------+---------------+---------+-----------+----------+--------------+     Summary: BILATERAL: - No evidence of deep vein thrombosis seen in the lower extremities, bilaterally. -No evidence of popliteal cyst, bilaterally.   *See table(s) above for measurements and observations. Electronically signed by Sherald Hess MD on 02/19/2023 at 1:35:43 PM.    Final    VAS [place] UPPER EXTREMITY VENOUS DUPLEX  Result Date: 02/19/2023 UPPER VENOUS STUDY  Patient Name:  TALI HAVENS  Date of Exam:   02/19/2023 Medical Rec #: [id number]      Accession #:    [id number] Date of Birth: 12/25/1970      Patient Gender: F Patient Age:   52 years Exam Location:  [facility] Procedure:      VAS [place] UPPER EXTREMITY VENOUS DUPLEX Referring Phys: CORNELIUS VAN DAM --------------------------------------------------------------------------------  Indications: pulmonary embolism Risk  Factors: Immobility confirmed PE Surgery laparotomy 02/11/23 past pregnancy. Limitations: Bandages, line and patient positioning. Comparison Study: Significant changes seen since prior exam 12/30/21 Performing Technologist: Shona Simpson  Examination Guidelines: A complete evaluation includes B-mode imaging, spectral Doppler, color Doppler, and power Doppler as needed of all accessible portions of each vessel. Bilateral testing is considered an integral part of a complete examination. Limited examinations for reoccurring indications may be performed as noted.  Right Findings: +----------+------------+---------+-----------+-------------------+-------+ RIGHT     CompressiblePhasicitySpontaneous    Properties     Summary +----------+------------+---------+-----------+-------------------+-------+ IJV           None       No        No     rigid w/compression Acute  +----------+------------+---------+-----------+-------------------+-------+ Subclavian    Full       Yes       Yes                               +----------+------------+---------+-----------+-------------------+-------+ Axillary      Full       Yes       Yes                               +----------+------------+---------+-----------+-------------------+-------+  [facility]  Telephone:(336) [phone]   HEMATOLOGY/ONCOLOGY IN-PATIENT CONSULTATION NOTE   PATIENT NAME: [name]   MR#: [id number] DOB: [birth year] CSN#: [id number]   DATE OF SERVICE: 02/21/2023  Requesting Physician: [facility]   Patient Care Team: [name], Cordelia Poche as PCP - General (Physician Assistant) Jonelle Sidle, MD as PCP - Cardiology (Cardiology) Rothbart, Gerrit Friends, MD (Cardiology) Corbin Ade, MD (Gastroenterology)  REASON FOR CONSULTATION:  Thrombocytosis, leukocytosis for further evaluation  HISTORY OF PRESENT ILLNESS  [name] is a 52 y.o. lady with past medical history of Crohn's disease on Skyrizi, COPD, hypertension, diabetes mellitus type 2, initially presented to the hospital on 01/27/2023 after being found unresponsive.  Per patient's family, she had bloody emesis with diarrhea over few days prior to presentation.  Initially SBP was 60s in the ED and patient was minimally responsive.  Patient was intubated and admitted to ICU for shock.  She was felt to have septic shock of unclear source.  On 02/10/2023, she underwent proctosigmoidoscopy for ongoing diarrhea which showed diffuse severe inflammation identified due to combination of Crohn's disease, active C. difficile infection and possible ischemic colitis.  On 02/11/2023 she underwent total abdominal colectomy with ileostomy.  Intraoperative findings included frank necrosis with pelvic abscess formation and significant colitis.  Later on 02/13/2023 he became tachycardic, hypotensive, lethargic and had to be transferred back to ICU.  Blood cultures came back positive for fungemia.  There was concern for septic emboli on CT abdomen pelvis.  She was also noted to have bilateral upper extremity DVT.  Hence she was started on heparin drip and continued on antibiotics.  Over the last few days, her platelet count has been steadily increasing and it was 1.09 million on 02/20/2023  and 1.38 million today.  White count 32,400.  Given progressive thrombocytosis, we were consulted for further evaluation.  Patient seen and evaluated. Recovering slowly from recent surgical procedures. Tolerating oral intake.   MEDICAL HISTORY Past Medical History:  Diagnosis Date   Anxiety    Asthma    Chronic low back pain    Collagen vascular disease (HCC)    COPD (chronic obstructive pulmonary disease) (HCC)    Crohn's disease (HCC)    GERD (gastroesophageal reflux disease)    EGD 01/2007 by Dr.Rourke small hiatal hernia s/p 56 french maloney    History of head injury    Hypertension    IBS (irritable bowel syndrome)    PSVT (paroxysmal supraventricular tachycardia) (HCC)    PTSD (post-traumatic stress disorder)    Recurrent chest pain    Seizure disorder (HCC)    Type 2 diabetes mellitus (HCC)      SURGICAL HISTORY Past Surgical History:  Procedure Laterality Date   ABDOMINAL HYSTERECTOMY     with right salpingo oophorectomy 2005   APPENDECTOMY  2006   BALLOON DILATION N/A 02/19/2021   Procedure: BALLOON DILATION;  Surgeon: Lanelle Bal, DO;  [facility];  Service: Endoscopy;  Laterality: N/A;  Sigmoid colon stricture   BIOPSY  02/06/2021   Procedure: BIOPSY;  Surgeon: Lanelle Bal, DO;  [facility];  Service: Endoscopy;;   BIOPSY  02/19/2021   Procedure: BIOPSY;  Surgeon: Lanelle Bal, DO;  [facility];  Service: Endoscopy;;   BIOPSY  07/15/2021   Procedure: BIOPSY;  Surgeon: Malissa Hippo, MD;  [facility];  Service: Endoscopy;;   CESAREAN SECTION     1996   CHOLECYSTECTOMY  reconstruction technique. CONTRAST:  75mL OMNIPAQUE IOHEXOL 350 MG/ML SOLN COMPARISON:  None Available. FINDINGS: Cardiovascular: No significant coronary artery calcification. Global cardiac size is within normal limits. Small pericardial effusion. Central pulmonary arteries are enlarged in keeping with changes of pulmonary arterial hypertension. No intraluminal filling defect identified through the segmental level to suggest acute pulmonary embolism. The thoracic aorta is unremarkable. Mediastinum/Nodes: Visualized thyroid is unremarkable. No pathologic thoracic adenopathy. Nasoenteric feeding tube extends into the upper abdomen beyond the margin examination. Esophagus unremarkable. Lungs/Pleura: Small right pleural effusion. Mild bibasilar atelectasis. Lungs are otherwise clear. No pneumothorax. No central obstructing lesion. Upper Abdomen: No acute abnormality. Musculoskeletal: No chest wall abnormality. No acute or significant osseous findings. Review of the MIP images confirms the above findings. IMPRESSION: 1. No pulmonary embolism. 2. Small right pleural effusion. 3. Small pericardial effusion. 4.  Enlarged central pulmonary arteries in keeping with changes of pulmonary arterial hypertension. Electronically Signed   By: Helyn Numbers M.D.   On: 02/08/2023 00:27   ECHOCARDIOGRAM COMPLETE  Result Date: 02/07/2023    ECHOCARDIOGRAM REPORT   Patient Name:   [name] Date of Exam: 02/07/2023 Medical Rec #:  [id number]     Height:       63.0 in Accession #:    [id number]    Weight:       154.1 lb Date of Birth:  [birth year]     BSA:          1.731 m Patient Age:    51 years      BP:           102/62 mmHg Patient Gender: F             HR:           135 bpm. Exam Location:  Inpatient Procedure: 2D Echo, Color Doppler and Cardiac Doppler Indications:    Sinus Tachycardia  History:        Patient has prior history of Echocardiogram examinations, most                 recent 03/10/2022. Infectious Colitis and COPD,                 Arrythmias:Cardiac Arrest and Tachycardia; Risk Factors:Current                 Smoker.  Sonographer:    Milbert Coulter Referring Phys: [id number] DAWOOD S ELGERGAWY IMPRESSIONS  1. Left ventricular ejection fraction, by estimation, is >75%. The left ventricle has hyperdynamic function. The left ventricle has no regional wall motion abnormalities. There is mild concentric left ventricular hypertrophy. Left ventricular diastolic parameters are indeterminate.  2. Right ventricular systolic function is normal. The right ventricular size is normal.  3. Pericardial effusion measuring up to 0.9 cm. Moderate pericardial effusion. There is no evidence of cardiac tamponade.  4. The mitral valve is normal in structure. No evidence of mitral valve regurgitation. No evidence of mitral stenosis.  5. The aortic valve is normal in structure. Aortic valve regurgitation is not visualized. No aortic stenosis is present.  6. The inferior vena cava is normal in size with greater than 50% respiratory variability, suggesting right atrial pressure of 3 mmHg. FINDINGS  Left Ventricle: Left ventricular ejection fraction,  by estimation, is >75%. The left ventricle has hyperdynamic function. The left ventricle has no regional wall motion abnormalities. The left ventricular internal cavity size was normal in size. There is mild concentric left ventricular hypertrophy. Left ventricular diastolic parameters are  indeterminate. Right Ventricle: The right ventricular size is normal. No increase in right ventricular wall thickness. Right ventricular systolic function is normal. Left Atrium: Left atrial size was normal in size. Right Atrium: Right atrial size was normal in size. Pericardium: Pericardial effusion measuring up to 0.9 cm. A moderately sized pericardial effusion is present. There is diastolic collapse of the right ventricular free wall. There is no evidence of cardiac tamponade. Mitral Valve: The mitral valve is normal in structure. No evidence of mitral valve regurgitation. No evidence of mitral valve stenosis. Tricuspid Valve: The tricuspid valve is normal in structure. Tricuspid valve regurgitation is not demonstrated. No evidence of tricuspid stenosis. Aortic Valve: The aortic valve is normal in structure. Aortic valve regurgitation is not visualized. No aortic stenosis is present. Pulmonic Valve: The pulmonic valve was normal in structure. Pulmonic valve regurgitation is not visualized. No evidence of pulmonic stenosis. Aorta: The aortic root is normal in size and structure. Venous: The inferior vena cava is normal in size with greater than 50% respiratory variability, suggesting right atrial pressure of 3 mmHg. IAS/Shunts: No atrial level shunt detected by color flow Doppler.  LEFT VENTRICLE PLAX 2D LVIDd:         4.20 cm LVIDs:         1.80 cm LV PW:         1.20 cm LV IVS:        1.20 cm LVOT diam:     1.90 cm LV SV:         78 LV SV Index:   45 LVOT Area:     2.84 cm  RIGHT VENTRICLE RV S prime:     17.70 cm/s TAPSE (M-mode): 2.5 cm LEFT ATRIUM             Index        RIGHT ATRIUM           Index LA Vol (A2C):   22.3  ml 12.88 ml/m  RA Area:     15.20 cm LA Vol (A4C):   32.8 ml 18.95 ml/m  RA Volume:   32.00 ml  18.49 ml/m LA Biplane Vol: 29.3 ml 16.93 ml/m  AORTIC VALVE LVOT Vmax:   143.00 cm/s LVOT Vmean:  100.000 cm/s LVOT VTI:    0.276 m  AORTA Ao Root diam: 3.20 cm Ao Asc diam:  3.20 cm  SHUNTS Systemic VTI:  0.28 m Systemic Diam: 1.90 cm Chilton Si MD Electronically signed by Chilton Si MD Signature Date/Time: 02/07/2023/5:23:03 PM    Final    VAS [place] LOWER EXTREMITY VENOUS (DVT)  Result Date: 02/07/2023  Lower Venous DVT Study Patient Name:  [name]  Date of Exam:   02/07/2023 Medical Rec #: [id number]      Accession #:    [id number] Date of Birth: 10-Nov-1970      Patient Gender: F Patient Age:   44 years Exam Location:  [facility] Procedure:      VAS [place] LOWER EXTREMITY VENOUS (DVT) Referring Phys: DAWOOD ELGERGAWY --------------------------------------------------------------------------------  Indications: Swelling.  Risk Factors: None identified. Comparison Study: No prior studies. Performing Technologist: Chanda Busing RVT  Examination Guidelines: A complete evaluation includes B-mode imaging, spectral Doppler, color Doppler, and power Doppler as needed of all accessible portions of each vessel. Bilateral testing is considered an integral part of a complete examination. Limited examinations for reoccurring indications may be performed as noted. The reflux portion of the exam is performed with the patient in reverse Trendelenburg.  +---------+---------------+---------+-----------+----------+--------------+  indeterminate. Right Ventricle: The right ventricular size is normal. No increase in right ventricular wall thickness. Right ventricular systolic function is normal. Left Atrium: Left atrial size was normal in size. Right Atrium: Right atrial size was normal in size. Pericardium: Pericardial effusion measuring up to 0.9 cm. A moderately sized pericardial effusion is present. There is diastolic collapse of the right ventricular free wall. There is no evidence of cardiac tamponade. Mitral Valve: The mitral valve is normal in structure. No evidence of mitral valve regurgitation. No evidence of mitral valve stenosis. Tricuspid Valve: The tricuspid valve is normal in structure. Tricuspid valve regurgitation is not demonstrated. No evidence of tricuspid stenosis. Aortic Valve: The aortic valve is normal in structure. Aortic valve regurgitation is not visualized. No aortic stenosis is present. Pulmonic Valve: The pulmonic valve was normal in structure. Pulmonic valve regurgitation is not visualized. No evidence of pulmonic stenosis. Aorta: The aortic root is normal in size and structure. Venous: The inferior vena cava is normal in size with greater than 50% respiratory variability, suggesting right atrial pressure of 3 mmHg. IAS/Shunts: No atrial level shunt detected by color flow Doppler.  LEFT VENTRICLE PLAX 2D LVIDd:         4.20 cm LVIDs:         1.80 cm LV PW:         1.20 cm LV IVS:        1.20 cm LVOT diam:     1.90 cm LV SV:         78 LV SV Index:   45 LVOT Area:     2.84 cm  RIGHT VENTRICLE RV S prime:     17.70 cm/s TAPSE (M-mode): 2.5 cm LEFT ATRIUM             Index        RIGHT ATRIUM           Index LA Vol (A2C):   22.3  ml 12.88 ml/m  RA Area:     15.20 cm LA Vol (A4C):   32.8 ml 18.95 ml/m  RA Volume:   32.00 ml  18.49 ml/m LA Biplane Vol: 29.3 ml 16.93 ml/m  AORTIC VALVE LVOT Vmax:   143.00 cm/s LVOT Vmean:  100.000 cm/s LVOT VTI:    0.276 m  AORTA Ao Root diam: 3.20 cm Ao Asc diam:  3.20 cm  SHUNTS Systemic VTI:  0.28 m Systemic Diam: 1.90 cm Chilton Si MD Electronically signed by Chilton Si MD Signature Date/Time: 02/07/2023/5:23:03 PM    Final    VAS [place] LOWER EXTREMITY VENOUS (DVT)  Result Date: 02/07/2023  Lower Venous DVT Study Patient Name:  [name]  Date of Exam:   02/07/2023 Medical Rec #: [id number]      Accession #:    [id number] Date of Birth: 10-Nov-1970      Patient Gender: F Patient Age:   44 years Exam Location:  [facility] Procedure:      VAS [place] LOWER EXTREMITY VENOUS (DVT) Referring Phys: DAWOOD ELGERGAWY --------------------------------------------------------------------------------  Indications: Swelling.  Risk Factors: None identified. Comparison Study: No prior studies. Performing Technologist: Chanda Busing RVT  Examination Guidelines: A complete evaluation includes B-mode imaging, spectral Doppler, color Doppler, and power Doppler as needed of all accessible portions of each vessel. Bilateral testing is considered an integral part of a complete examination. Limited examinations for reoccurring indications may be performed as noted. The reflux portion of the exam is performed with the patient in reverse Trendelenburg.  +---------+---------------+---------+-----------+----------+--------------+  indeterminate. Right Ventricle: The right ventricular size is normal. No increase in right ventricular wall thickness. Right ventricular systolic function is normal. Left Atrium: Left atrial size was normal in size. Right Atrium: Right atrial size was normal in size. Pericardium: Pericardial effusion measuring up to 0.9 cm. A moderately sized pericardial effusion is present. There is diastolic collapse of the right ventricular free wall. There is no evidence of cardiac tamponade. Mitral Valve: The mitral valve is normal in structure. No evidence of mitral valve regurgitation. No evidence of mitral valve stenosis. Tricuspid Valve: The tricuspid valve is normal in structure. Tricuspid valve regurgitation is not demonstrated. No evidence of tricuspid stenosis. Aortic Valve: The aortic valve is normal in structure. Aortic valve regurgitation is not visualized. No aortic stenosis is present. Pulmonic Valve: The pulmonic valve was normal in structure. Pulmonic valve regurgitation is not visualized. No evidence of pulmonic stenosis. Aorta: The aortic root is normal in size and structure. Venous: The inferior vena cava is normal in size with greater than 50% respiratory variability, suggesting right atrial pressure of 3 mmHg. IAS/Shunts: No atrial level shunt detected by color flow Doppler.  LEFT VENTRICLE PLAX 2D LVIDd:         4.20 cm LVIDs:         1.80 cm LV PW:         1.20 cm LV IVS:        1.20 cm LVOT diam:     1.90 cm LV SV:         78 LV SV Index:   45 LVOT Area:     2.84 cm  RIGHT VENTRICLE RV S prime:     17.70 cm/s TAPSE (M-mode): 2.5 cm LEFT ATRIUM             Index        RIGHT ATRIUM           Index LA Vol (A2C):   22.3  ml 12.88 ml/m  RA Area:     15.20 cm LA Vol (A4C):   32.8 ml 18.95 ml/m  RA Volume:   32.00 ml  18.49 ml/m LA Biplane Vol: 29.3 ml 16.93 ml/m  AORTIC VALVE LVOT Vmax:   143.00 cm/s LVOT Vmean:  100.000 cm/s LVOT VTI:    0.276 m  AORTA Ao Root diam: 3.20 cm Ao Asc diam:  3.20 cm  SHUNTS Systemic VTI:  0.28 m Systemic Diam: 1.90 cm Chilton Si MD Electronically signed by Chilton Si MD Signature Date/Time: 02/07/2023/5:23:03 PM    Final    VAS [place] LOWER EXTREMITY VENOUS (DVT)  Result Date: 02/07/2023  Lower Venous DVT Study Patient Name:  [name]  Date of Exam:   02/07/2023 Medical Rec #: [id number]      Accession #:    [id number] Date of Birth: 10-Nov-1970      Patient Gender: F Patient Age:   44 years Exam Location:  [facility] Procedure:      VAS [place] LOWER EXTREMITY VENOUS (DVT) Referring Phys: DAWOOD ELGERGAWY --------------------------------------------------------------------------------  Indications: Swelling.  Risk Factors: None identified. Comparison Study: No prior studies. Performing Technologist: Chanda Busing RVT  Examination Guidelines: A complete evaluation includes B-mode imaging, spectral Doppler, color Doppler, and power Doppler as needed of all accessible portions of each vessel. Bilateral testing is considered an integral part of a complete examination. Limited examinations for reoccurring indications may be performed as noted. The reflux portion of the exam is performed with the patient in reverse Trendelenburg.  +---------+---------------+---------+-----------+----------+--------------+  PFV      Full                                                        +---------+---------------+---------+-----------+----------+--------------+ POP      Full           Yes      Yes                                 +---------+---------------+---------+-----------+----------+--------------+ PTV      Full                                                        +---------+---------------+---------+-----------+----------+--------------+ PERO     Full                                                        +---------+---------------+---------+-----------+----------+--------------+     Summary: BILATERAL: - No evidence of deep vein thrombosis seen in the lower extremities, bilaterally. -No evidence of popliteal cyst, bilaterally.   *See table(s) above for measurements and observations. Electronically signed by Sherald Hess MD on 02/19/2023 at 1:35:43 PM.    Final    VAS [place] UPPER EXTREMITY VENOUS DUPLEX  Result Date: 02/19/2023 UPPER VENOUS STUDY  Patient Name:  TALI HAVENS  Date of Exam:   02/19/2023 Medical Rec #: [id number]      Accession #:    [id number] Date of Birth: 12/25/1970      Patient Gender: F Patient Age:   52 years Exam Location:  [facility] Procedure:      VAS [place] UPPER EXTREMITY VENOUS DUPLEX Referring Phys: CORNELIUS VAN DAM --------------------------------------------------------------------------------  Indications: pulmonary embolism Risk  Factors: Immobility confirmed PE Surgery laparotomy 02/11/23 past pregnancy. Limitations: Bandages, line and patient positioning. Comparison Study: Significant changes seen since prior exam 12/30/21 Performing Technologist: Shona Simpson  Examination Guidelines: A complete evaluation includes B-mode imaging, spectral Doppler, color Doppler, and power Doppler as needed of all accessible portions of each vessel. Bilateral testing is considered an integral part of a complete examination. Limited examinations for reoccurring indications may be performed as noted.  Right Findings: +----------+------------+---------+-----------+-------------------+-------+ RIGHT     CompressiblePhasicitySpontaneous    Properties     Summary +----------+------------+---------+-----------+-------------------+-------+ IJV           None       No        No     rigid w/compression Acute  +----------+------------+---------+-----------+-------------------+-------+ Subclavian    Full       Yes       Yes                               +----------+------------+---------+-----------+-------------------+-------+ Axillary      Full       Yes       Yes                               +----------+------------+---------+-----------+-------------------+-------+  reconstruction technique. CONTRAST:  75mL OMNIPAQUE IOHEXOL 350 MG/ML SOLN COMPARISON:  None Available. FINDINGS: Cardiovascular: No significant coronary artery calcification. Global cardiac size is within normal limits. Small pericardial effusion. Central pulmonary arteries are enlarged in keeping with changes of pulmonary arterial hypertension. No intraluminal filling defect identified through the segmental level to suggest acute pulmonary embolism. The thoracic aorta is unremarkable. Mediastinum/Nodes: Visualized thyroid is unremarkable. No pathologic thoracic adenopathy. Nasoenteric feeding tube extends into the upper abdomen beyond the margin examination. Esophagus unremarkable. Lungs/Pleura: Small right pleural effusion. Mild bibasilar atelectasis. Lungs are otherwise clear. No pneumothorax. No central obstructing lesion. Upper Abdomen: No acute abnormality. Musculoskeletal: No chest wall abnormality. No acute or significant osseous findings. Review of the MIP images confirms the above findings. IMPRESSION: 1. No pulmonary embolism. 2. Small right pleural effusion. 3. Small pericardial effusion. 4.  Enlarged central pulmonary arteries in keeping with changes of pulmonary arterial hypertension. Electronically Signed   By: Helyn Numbers M.D.   On: 02/08/2023 00:27   ECHOCARDIOGRAM COMPLETE  Result Date: 02/07/2023    ECHOCARDIOGRAM REPORT   Patient Name:   [name] Date of Exam: 02/07/2023 Medical Rec #:  [id number]     Height:       63.0 in Accession #:    [id number]    Weight:       154.1 lb Date of Birth:  [birth year]     BSA:          1.731 m Patient Age:    51 years      BP:           102/62 mmHg Patient Gender: F             HR:           135 bpm. Exam Location:  Inpatient Procedure: 2D Echo, Color Doppler and Cardiac Doppler Indications:    Sinus Tachycardia  History:        Patient has prior history of Echocardiogram examinations, most                 recent 03/10/2022. Infectious Colitis and COPD,                 Arrythmias:Cardiac Arrest and Tachycardia; Risk Factors:Current                 Smoker.  Sonographer:    Milbert Coulter Referring Phys: [id number] DAWOOD S ELGERGAWY IMPRESSIONS  1. Left ventricular ejection fraction, by estimation, is >75%. The left ventricle has hyperdynamic function. The left ventricle has no regional wall motion abnormalities. There is mild concentric left ventricular hypertrophy. Left ventricular diastolic parameters are indeterminate.  2. Right ventricular systolic function is normal. The right ventricular size is normal.  3. Pericardial effusion measuring up to 0.9 cm. Moderate pericardial effusion. There is no evidence of cardiac tamponade.  4. The mitral valve is normal in structure. No evidence of mitral valve regurgitation. No evidence of mitral stenosis.  5. The aortic valve is normal in structure. Aortic valve regurgitation is not visualized. No aortic stenosis is present.  6. The inferior vena cava is normal in size with greater than 50% respiratory variability, suggesting right atrial pressure of 3 mmHg. FINDINGS  Left Ventricle: Left ventricular ejection fraction,  by estimation, is >75%. The left ventricle has hyperdynamic function. The left ventricle has no regional wall motion abnormalities. The left ventricular internal cavity size was normal in size. There is mild concentric left ventricular hypertrophy. Left ventricular diastolic parameters are  Summary:  Right: No evidence of superficial vein thrombosis in the upper extremity. Findings consistent with acute deep vein thrombosis involving the right internal jugular vein and right brachial veins.  Left: No evidence of superficial vein thrombosis in the upper extremity. Findings consistent with acute deep vein thrombosis involving the left brachial veins.  *See table(s) above for measurements and observations.  Diagnosing physician: Sherald Hess MD Electronically signed by Sherald Hess MD on 02/19/2023 at 1:35:26 PM.    Final    ECHO TEE  Result Date:  02/18/2023    TRANSESOPHOGEAL ECHO REPORT   Patient Name:   [name] Date of Exam: 02/18/2023 Medical Rec #:  [id number]     Height:       63.0 in Accession #:    [id number]    Weight:       146.8 lb Date of Birth:  03/28/1971     BSA:          1.696 m Patient Age:    51 years      BP:           140/61 mmHg Patient Gender: F             HR:           111 bpm. Exam Location:  Inpatient Procedure: Transesophageal Echo, Cardiac Doppler, Color Doppler and Saline            Contrast Bubble Study Indications:     Candidemia. Splenic infarct.  History:         Patient has prior history of Echocardiogram examinations, most                  recent 02/07/2023. Sepsis and COPD, Arrythmias:Cardiac Arrest;                  Risk Factors:Current Smoker.  Sonographer:     Delcie Roch RDCS Referring Phys:  [id number] Cyndi Bender Diagnosing Phys: Lennie Odor MD PROCEDURE: After discussion of the risks and benefits of a TEE, an informed consent was obtained from the patient. TEE procedure time was 10 minutes. The transesophogeal probe was passed without difficulty through the esophogus of the patient. Imaged were obtained with the patient in a left lateral decubitus position. Sedation performed by different physician. The patient was monitored while under deep sedation. Anesthestetic sedation was provided intravenously by Anesthesiology: 114mg  of Propofol. Image quality was excellent. The patient's vital signs; including heart rate, blood pressure, and oxygen saturation; remained stable throughout the procedure. The patient developed no complications during the procedure.  IMPRESSIONS  1. Negative TEE for endocarditis.  2. Left ventricular ejection fraction, by estimation, is 60 to 65%. The left ventricle has normal function.  3. Right ventricular systolic function is normal. The right ventricular size is normal.  4. No left atrial/left atrial appendage thrombus was detected.  5. Moderate pericardial effusion. The pericardial  effusion is anterior to the right ventricle.  6. The mitral valve is grossly normal. Trivial mitral valve regurgitation. No evidence of mitral stenosis.  7. The aortic valve is tricuspid. Aortic valve regurgitation is not visualized. No aortic stenosis is present.  8. Agitated saline contrast bubble study was negative, with no evidence of any interatrial shunt. Conclusion(s)/Recommendation(s): No evidence of vegetation/infective endocarditis on this transesophageael echocardiogram. FINDINGS  Left Ventricle: Left ventricular ejection fraction, by estimation, is 60 to 65%. The left ventricle has normal function. The left ventricular internal cavity size was normal in size. Right Ventricle: The right  indeterminate. Right Ventricle: The right ventricular size is normal. No increase in right ventricular wall thickness. Right ventricular systolic function is normal. Left Atrium: Left atrial size was normal in size. Right Atrium: Right atrial size was normal in size. Pericardium: Pericardial effusion measuring up to 0.9 cm. A moderately sized pericardial effusion is present. There is diastolic collapse of the right ventricular free wall. There is no evidence of cardiac tamponade. Mitral Valve: The mitral valve is normal in structure. No evidence of mitral valve regurgitation. No evidence of mitral valve stenosis. Tricuspid Valve: The tricuspid valve is normal in structure. Tricuspid valve regurgitation is not demonstrated. No evidence of tricuspid stenosis. Aortic Valve: The aortic valve is normal in structure. Aortic valve regurgitation is not visualized. No aortic stenosis is present. Pulmonic Valve: The pulmonic valve was normal in structure. Pulmonic valve regurgitation is not visualized. No evidence of pulmonic stenosis. Aorta: The aortic root is normal in size and structure. Venous: The inferior vena cava is normal in size with greater than 50% respiratory variability, suggesting right atrial pressure of 3 mmHg. IAS/Shunts: No atrial level shunt detected by color flow Doppler.  LEFT VENTRICLE PLAX 2D LVIDd:         4.20 cm LVIDs:         1.80 cm LV PW:         1.20 cm LV IVS:        1.20 cm LVOT diam:     1.90 cm LV SV:         78 LV SV Index:   45 LVOT Area:     2.84 cm  RIGHT VENTRICLE RV S prime:     17.70 cm/s TAPSE (M-mode): 2.5 cm LEFT ATRIUM             Index        RIGHT ATRIUM           Index LA Vol (A2C):   22.3  ml 12.88 ml/m  RA Area:     15.20 cm LA Vol (A4C):   32.8 ml 18.95 ml/m  RA Volume:   32.00 ml  18.49 ml/m LA Biplane Vol: 29.3 ml 16.93 ml/m  AORTIC VALVE LVOT Vmax:   143.00 cm/s LVOT Vmean:  100.000 cm/s LVOT VTI:    0.276 m  AORTA Ao Root diam: 3.20 cm Ao Asc diam:  3.20 cm  SHUNTS Systemic VTI:  0.28 m Systemic Diam: 1.90 cm Chilton Si MD Electronically signed by Chilton Si MD Signature Date/Time: 02/07/2023/5:23:03 PM    Final    VAS [place] LOWER EXTREMITY VENOUS (DVT)  Result Date: 02/07/2023  Lower Venous DVT Study Patient Name:  [name]  Date of Exam:   02/07/2023 Medical Rec #: [id number]      Accession #:    [id number] Date of Birth: 10-Nov-1970      Patient Gender: F Patient Age:   44 years Exam Location:  [facility] Procedure:      VAS [place] LOWER EXTREMITY VENOUS (DVT) Referring Phys: DAWOOD ELGERGAWY --------------------------------------------------------------------------------  Indications: Swelling.  Risk Factors: None identified. Comparison Study: No prior studies. Performing Technologist: Chanda Busing RVT  Examination Guidelines: A complete evaluation includes B-mode imaging, spectral Doppler, color Doppler, and power Doppler as needed of all accessible portions of each vessel. Bilateral testing is considered an integral part of a complete examination. Limited examinations for reoccurring indications may be performed as noted. The reflux portion of the exam is performed with the patient in reverse Trendelenburg.  +---------+---------------+---------+-----------+----------+--------------+  indeterminate. Right Ventricle: The right ventricular size is normal. No increase in right ventricular wall thickness. Right ventricular systolic function is normal. Left Atrium: Left atrial size was normal in size. Right Atrium: Right atrial size was normal in size. Pericardium: Pericardial effusion measuring up to 0.9 cm. A moderately sized pericardial effusion is present. There is diastolic collapse of the right ventricular free wall. There is no evidence of cardiac tamponade. Mitral Valve: The mitral valve is normal in structure. No evidence of mitral valve regurgitation. No evidence of mitral valve stenosis. Tricuspid Valve: The tricuspid valve is normal in structure. Tricuspid valve regurgitation is not demonstrated. No evidence of tricuspid stenosis. Aortic Valve: The aortic valve is normal in structure. Aortic valve regurgitation is not visualized. No aortic stenosis is present. Pulmonic Valve: The pulmonic valve was normal in structure. Pulmonic valve regurgitation is not visualized. No evidence of pulmonic stenosis. Aorta: The aortic root is normal in size and structure. Venous: The inferior vena cava is normal in size with greater than 50% respiratory variability, suggesting right atrial pressure of 3 mmHg. IAS/Shunts: No atrial level shunt detected by color flow Doppler.  LEFT VENTRICLE PLAX 2D LVIDd:         4.20 cm LVIDs:         1.80 cm LV PW:         1.20 cm LV IVS:        1.20 cm LVOT diam:     1.90 cm LV SV:         78 LV SV Index:   45 LVOT Area:     2.84 cm  RIGHT VENTRICLE RV S prime:     17.70 cm/s TAPSE (M-mode): 2.5 cm LEFT ATRIUM             Index        RIGHT ATRIUM           Index LA Vol (A2C):   22.3  ml 12.88 ml/m  RA Area:     15.20 cm LA Vol (A4C):   32.8 ml 18.95 ml/m  RA Volume:   32.00 ml  18.49 ml/m LA Biplane Vol: 29.3 ml 16.93 ml/m  AORTIC VALVE LVOT Vmax:   143.00 cm/s LVOT Vmean:  100.000 cm/s LVOT VTI:    0.276 m  AORTA Ao Root diam: 3.20 cm Ao Asc diam:  3.20 cm  SHUNTS Systemic VTI:  0.28 m Systemic Diam: 1.90 cm Chilton Si MD Electronically signed by Chilton Si MD Signature Date/Time: 02/07/2023/5:23:03 PM    Final    VAS [place] LOWER EXTREMITY VENOUS (DVT)  Result Date: 02/07/2023  Lower Venous DVT Study Patient Name:  [name]  Date of Exam:   02/07/2023 Medical Rec #: [id number]      Accession #:    [id number] Date of Birth: 10-Nov-1970      Patient Gender: F Patient Age:   44 years Exam Location:  [facility] Procedure:      VAS [place] LOWER EXTREMITY VENOUS (DVT) Referring Phys: DAWOOD ELGERGAWY --------------------------------------------------------------------------------  Indications: Swelling.  Risk Factors: None identified. Comparison Study: No prior studies. Performing Technologist: Chanda Busing RVT  Examination Guidelines: A complete evaluation includes B-mode imaging, spectral Doppler, color Doppler, and power Doppler as needed of all accessible portions of each vessel. Bilateral testing is considered an integral part of a complete examination. Limited examinations for reoccurring indications may be performed as noted. The reflux portion of the exam is performed with the patient in reverse Trendelenburg.  +---------+---------------+---------+-----------+----------+--------------+  reconstruction technique. CONTRAST:  75mL OMNIPAQUE IOHEXOL 350 MG/ML SOLN COMPARISON:  None Available. FINDINGS: Cardiovascular: No significant coronary artery calcification. Global cardiac size is within normal limits. Small pericardial effusion. Central pulmonary arteries are enlarged in keeping with changes of pulmonary arterial hypertension. No intraluminal filling defect identified through the segmental level to suggest acute pulmonary embolism. The thoracic aorta is unremarkable. Mediastinum/Nodes: Visualized thyroid is unremarkable. No pathologic thoracic adenopathy. Nasoenteric feeding tube extends into the upper abdomen beyond the margin examination. Esophagus unremarkable. Lungs/Pleura: Small right pleural effusion. Mild bibasilar atelectasis. Lungs are otherwise clear. No pneumothorax. No central obstructing lesion. Upper Abdomen: No acute abnormality. Musculoskeletal: No chest wall abnormality. No acute or significant osseous findings. Review of the MIP images confirms the above findings. IMPRESSION: 1. No pulmonary embolism. 2. Small right pleural effusion. 3. Small pericardial effusion. 4.  Enlarged central pulmonary arteries in keeping with changes of pulmonary arterial hypertension. Electronically Signed   By: Helyn Numbers M.D.   On: 02/08/2023 00:27   ECHOCARDIOGRAM COMPLETE  Result Date: 02/07/2023    ECHOCARDIOGRAM REPORT   Patient Name:   [name] Date of Exam: 02/07/2023 Medical Rec #:  [id number]     Height:       63.0 in Accession #:    [id number]    Weight:       154.1 lb Date of Birth:  [birth year]     BSA:          1.731 m Patient Age:    51 years      BP:           102/62 mmHg Patient Gender: F             HR:           135 bpm. Exam Location:  Inpatient Procedure: 2D Echo, Color Doppler and Cardiac Doppler Indications:    Sinus Tachycardia  History:        Patient has prior history of Echocardiogram examinations, most                 recent 03/10/2022. Infectious Colitis and COPD,                 Arrythmias:Cardiac Arrest and Tachycardia; Risk Factors:Current                 Smoker.  Sonographer:    Milbert Coulter Referring Phys: 43 DAWOOD S ELGERGAWY IMPRESSIONS  1. Left ventricular ejection fraction, by estimation, is >75%. The left ventricle has hyperdynamic function. The left ventricle has no regional wall motion abnormalities. There is mild concentric left ventricular hypertrophy. Left ventricular diastolic parameters are indeterminate.  2. Right ventricular systolic function is normal. The right ventricular size is normal.  3. Pericardial effusion measuring up to 0.9 cm. Moderate pericardial effusion. There is no evidence of cardiac tamponade.  4. The mitral valve is normal in structure. No evidence of mitral valve regurgitation. No evidence of mitral stenosis.  5. The aortic valve is normal in structure. Aortic valve regurgitation is not visualized. No aortic stenosis is present.  6. The inferior vena cava is normal in size with greater than 50% respiratory variability, suggesting right atrial pressure of 3 mmHg. FINDINGS  Left Ventricle: Left ventricular ejection fraction,  by estimation, is >75%. The left ventricle has hyperdynamic function. The left ventricle has no regional wall motion abnormalities. The left ventricular internal cavity size was normal in size. There is mild concentric left ventricular hypertrophy. Left ventricular diastolic parameters are  indeterminate. Right Ventricle: The right ventricular size is normal. No increase in right ventricular wall thickness. Right ventricular systolic function is normal. Left Atrium: Left atrial size was normal in size. Right Atrium: Right atrial size was normal in size. Pericardium: Pericardial effusion measuring up to 0.9 cm. A moderately sized pericardial effusion is present. There is diastolic collapse of the right ventricular free wall. There is no evidence of cardiac tamponade. Mitral Valve: The mitral valve is normal in structure. No evidence of mitral valve regurgitation. No evidence of mitral valve stenosis. Tricuspid Valve: The tricuspid valve is normal in structure. Tricuspid valve regurgitation is not demonstrated. No evidence of tricuspid stenosis. Aortic Valve: The aortic valve is normal in structure. Aortic valve regurgitation is not visualized. No aortic stenosis is present. Pulmonic Valve: The pulmonic valve was normal in structure. Pulmonic valve regurgitation is not visualized. No evidence of pulmonic stenosis. Aorta: The aortic root is normal in size and structure. Venous: The inferior vena cava is normal in size with greater than 50% respiratory variability, suggesting right atrial pressure of 3 mmHg. IAS/Shunts: No atrial level shunt detected by color flow Doppler.  LEFT VENTRICLE PLAX 2D LVIDd:         4.20 cm LVIDs:         1.80 cm LV PW:         1.20 cm LV IVS:        1.20 cm LVOT diam:     1.90 cm LV SV:         78 LV SV Index:   45 LVOT Area:     2.84 cm  RIGHT VENTRICLE RV S prime:     17.70 cm/s TAPSE (M-mode): 2.5 cm LEFT ATRIUM             Index        RIGHT ATRIUM           Index LA Vol (A2C):   22.3  ml 12.88 ml/m  RA Area:     15.20 cm LA Vol (A4C):   32.8 ml 18.95 ml/m  RA Volume:   32.00 ml  18.49 ml/m LA Biplane Vol: 29.3 ml 16.93 ml/m  AORTIC VALVE LVOT Vmax:   143.00 cm/s LVOT Vmean:  100.000 cm/s LVOT VTI:    0.276 m  AORTA Ao Root diam: 3.20 cm Ao Asc diam:  3.20 cm  SHUNTS Systemic VTI:  0.28 m Systemic Diam: 1.90 cm Chilton Si MD Electronically signed by Chilton Si MD Signature Date/Time: 02/07/2023/5:23:03 PM    Final    VAS [place] LOWER EXTREMITY VENOUS (DVT)  Result Date: 02/07/2023  Lower Venous DVT Study Patient Name:  [name]  Date of Exam:   02/07/2023 Medical Rec #: [id number]      Accession #:    [id number] Date of Birth: 10-Nov-1970      Patient Gender: F Patient Age:   44 years Exam Location:  [facility] Procedure:      VAS [place] LOWER EXTREMITY VENOUS (DVT) Referring Phys: DAWOOD ELGERGAWY --------------------------------------------------------------------------------  Indications: Swelling.  Risk Factors: None identified. Comparison Study: No prior studies. Performing Technologist: Chanda Busing RVT  Examination Guidelines: A complete evaluation includes B-mode imaging, spectral Doppler, color Doppler, and power Doppler as needed of all accessible portions of each vessel. Bilateral testing is considered an integral part of a complete examination. Limited examinations for reoccurring indications may be performed as noted. The reflux portion of the exam is performed with the patient in reverse Trendelenburg.  +---------+---------------+---------+-----------+----------+--------------+  reconstruction technique. CONTRAST:  75mL OMNIPAQUE IOHEXOL 350 MG/ML SOLN COMPARISON:  None Available. FINDINGS: Cardiovascular: No significant coronary artery calcification. Global cardiac size is within normal limits. Small pericardial effusion. Central pulmonary arteries are enlarged in keeping with changes of pulmonary arterial hypertension. No intraluminal filling defect identified through the segmental level to suggest acute pulmonary embolism. The thoracic aorta is unremarkable. Mediastinum/Nodes: Visualized thyroid is unremarkable. No pathologic thoracic adenopathy. Nasoenteric feeding tube extends into the upper abdomen beyond the margin examination. Esophagus unremarkable. Lungs/Pleura: Small right pleural effusion. Mild bibasilar atelectasis. Lungs are otherwise clear. No pneumothorax. No central obstructing lesion. Upper Abdomen: No acute abnormality. Musculoskeletal: No chest wall abnormality. No acute or significant osseous findings. Review of the MIP images confirms the above findings. IMPRESSION: 1. No pulmonary embolism. 2. Small right pleural effusion. 3. Small pericardial effusion. 4.  Enlarged central pulmonary arteries in keeping with changes of pulmonary arterial hypertension. Electronically Signed   By: Helyn Numbers M.D.   On: 02/08/2023 00:27   ECHOCARDIOGRAM COMPLETE  Result Date: 02/07/2023    ECHOCARDIOGRAM REPORT   Patient Name:   [name] Date of Exam: 02/07/2023 Medical Rec #:  [id number]     Height:       63.0 in Accession #:    [id number]    Weight:       154.1 lb Date of Birth:  [birth year]     BSA:          1.731 m Patient Age:    51 years      BP:           102/62 mmHg Patient Gender: F             HR:           135 bpm. Exam Location:  Inpatient Procedure: 2D Echo, Color Doppler and Cardiac Doppler Indications:    Sinus Tachycardia  History:        Patient has prior history of Echocardiogram examinations, most                 recent 03/10/2022. Infectious Colitis and COPD,                 Arrythmias:Cardiac Arrest and Tachycardia; Risk Factors:Current                 Smoker.  Sonographer:    Milbert Coulter Referring Phys: 43 DAWOOD S ELGERGAWY IMPRESSIONS  1. Left ventricular ejection fraction, by estimation, is >75%. The left ventricle has hyperdynamic function. The left ventricle has no regional wall motion abnormalities. There is mild concentric left ventricular hypertrophy. Left ventricular diastolic parameters are indeterminate.  2. Right ventricular systolic function is normal. The right ventricular size is normal.  3. Pericardial effusion measuring up to 0.9 cm. Moderate pericardial effusion. There is no evidence of cardiac tamponade.  4. The mitral valve is normal in structure. No evidence of mitral valve regurgitation. No evidence of mitral stenosis.  5. The aortic valve is normal in structure. Aortic valve regurgitation is not visualized. No aortic stenosis is present.  6. The inferior vena cava is normal in size with greater than 50% respiratory variability, suggesting right atrial pressure of 3 mmHg. FINDINGS  Left Ventricle: Left ventricular ejection fraction,  by estimation, is >75%. The left ventricle has hyperdynamic function. The left ventricle has no regional wall motion abnormalities. The left ventricular internal cavity size was normal in size. There is mild concentric left ventricular hypertrophy. Left ventricular diastolic parameters are  [facility]  Telephone:([phone]) [phone]   HEMATOLOGY/ONCOLOGY IN-PATIENT CONSULTATION NOTE   PATIENT NAME: [name]   MR#: [id number] DOB: [birth year] CSN#: [id number]   DATE OF SERVICE: 02/21/2023  Requesting Physician: [facility]   Patient Care Team: [name], Cordelia Poche as PCP - General (Physician Assistant) Jonelle Sidle, MD as PCP - Cardiology (Cardiology) Rothbart, Gerrit Friends, MD (Cardiology) Corbin Ade, MD (Gastroenterology)  REASON FOR CONSULTATION:  Thrombocytosis, leukocytosis for further evaluation  HISTORY OF PRESENT ILLNESS  [name] is a 52 y.o. lady with past medical history of Crohn's disease on Skyrizi, COPD, hypertension, diabetes mellitus type 2, initially presented to the hospital on 01/27/2023 after being found unresponsive.  Per patient's family, she had bloody emesis with diarrhea over few days prior to presentation.  Initially SBP was 60s in the ED and patient was minimally responsive.  Patient was intubated and admitted to ICU for shock.  She was felt to have septic shock of unclear source.  On 02/10/2023, she underwent proctosigmoidoscopy for ongoing diarrhea which showed diffuse severe inflammation identified due to combination of Crohn's disease, active C. difficile infection and possible ischemic colitis.  On 02/11/2023 she underwent total abdominal colectomy with ileostomy.  Intraoperative findings included frank necrosis with pelvic abscess formation and significant colitis.  Later on 02/13/2023 he became tachycardic, hypotensive, lethargic and had to be transferred back to ICU.  Blood cultures came back positive for fungemia.  There was concern for septic emboli on CT abdomen pelvis.  She was also noted to have bilateral upper extremity DVT.  Hence she was started on heparin drip and continued on antibiotics.  Over the last few days, her platelet count has been steadily increasing and it was 1.09 million on 02/20/2023  and 1.38 million today.  White count 32,400.  Given progressive thrombocytosis, we were consulted for further evaluation.  Patient seen and evaluated. Recovering slowly from recent surgical procedures. Tolerating oral intake.   MEDICAL HISTORY Past Medical History:  Diagnosis Date   Anxiety    Asthma    Chronic low back pain    Collagen vascular disease (HCC)    COPD (chronic obstructive pulmonary disease) (HCC)    Crohn's disease (HCC)    GERD (gastroesophageal reflux disease)    EGD 01/2007 by Dr.Rourke small hiatal hernia s/p 56 french maloney    History of head injury    Hypertension    IBS (irritable bowel syndrome)    PSVT (paroxysmal supraventricular tachycardia) (HCC)    PTSD (post-traumatic stress disorder)    Recurrent chest pain    Seizure disorder (HCC)    Type 2 diabetes mellitus (HCC)      SURGICAL HISTORY Past Surgical History:  Procedure Laterality Date   ABDOMINAL HYSTERECTOMY     with right salpingo oophorectomy 2005   APPENDECTOMY  2006   BALLOON DILATION N/A 02/19/2021   Procedure: BALLOON DILATION;  Surgeon: Lanelle Bal, DO;  [facility]: [facility];  Service: Endoscopy;  Laterality: N/A;  Sigmoid colon stricture   BIOPSY  02/06/2021   Procedure: BIOPSY;  Surgeon: Lanelle Bal, DO;  Location: [facility];  Service: Endoscopy;;   BIOPSY  02/19/2021   Procedure: BIOPSY;  Surgeon: Lanelle Bal, DO;  Location: [facility];  Service: Endoscopy;;   BIOPSY  07/15/2021   Procedure: BIOPSY;  Surgeon: Malissa Hippo, MD;  Location: [facility];  Service: Endoscopy;;   CESAREAN SECTION     1996   CHOLECYSTECTOMY  reconstruction technique. CONTRAST:  75mL OMNIPAQUE IOHEXOL 350 MG/ML SOLN COMPARISON:  None Available. FINDINGS: Cardiovascular: No significant coronary artery calcification. Global cardiac size is within normal limits. Small pericardial effusion. Central pulmonary arteries are enlarged in keeping with changes of pulmonary arterial hypertension. No intraluminal filling defect identified through the segmental level to suggest acute pulmonary embolism. The thoracic aorta is unremarkable. Mediastinum/Nodes: Visualized thyroid is unremarkable. No pathologic thoracic adenopathy. Nasoenteric feeding tube extends into the upper abdomen beyond the margin examination. Esophagus unremarkable. Lungs/Pleura: Small right pleural effusion. Mild bibasilar atelectasis. Lungs are otherwise clear. No pneumothorax. No central obstructing lesion. Upper Abdomen: No acute abnormality. Musculoskeletal: No chest wall abnormality. No acute or significant osseous findings. Review of the MIP images confirms the above findings. IMPRESSION: 1. No pulmonary embolism. 2. Small right pleural effusion. 3. Small pericardial effusion. 4.  Enlarged central pulmonary arteries in keeping with changes of pulmonary arterial hypertension. Electronically Signed   By: Helyn Numbers M.D.   On: 02/08/2023 00:27   ECHOCARDIOGRAM COMPLETE  Result Date: 02/07/2023    ECHOCARDIOGRAM REPORT   Patient Name:   [name] Date of Exam: 02/07/2023 Medical Rec #:  [id number]     Height:       63.0 in Accession #:    [id number]    Weight:       154.1 lb Date of Birth:  [birth year]     BSA:          1.731 m Patient Age:    51 years      BP:           102/62 mmHg Patient Gender: F             HR:           135 bpm. Exam Location:  Inpatient Procedure: 2D Echo, Color Doppler and Cardiac Doppler Indications:    Sinus Tachycardia  History:        Patient has prior history of Echocardiogram examinations, most                 recent 03/10/2022. Infectious Colitis and COPD,                 Arrythmias:Cardiac Arrest and Tachycardia; Risk Factors:Current                 Smoker.  Sonographer:    Milbert Coulter Referring Phys: [id number] DAWOOD S ELGERGAWY IMPRESSIONS  1. Left ventricular ejection fraction, by estimation, is >75%. The left ventricle has hyperdynamic function. The left ventricle has no regional wall motion abnormalities. There is mild concentric left ventricular hypertrophy. Left ventricular diastolic parameters are indeterminate.  2. Right ventricular systolic function is normal. The right ventricular size is normal.  3. Pericardial effusion measuring up to 0.9 cm. Moderate pericardial effusion. There is no evidence of cardiac tamponade.  4. The mitral valve is normal in structure. No evidence of mitral valve regurgitation. No evidence of mitral stenosis.  5. The aortic valve is normal in structure. Aortic valve regurgitation is not visualized. No aortic stenosis is present.  6. The inferior vena cava is normal in size with greater than 50% respiratory variability, suggesting right atrial pressure of 3 mmHg. FINDINGS  Left Ventricle: Left ventricular ejection fraction,  by estimation, is >75%. The left ventricle has hyperdynamic function. The left ventricle has no regional wall motion abnormalities. The left ventricular internal cavity size was normal in size. There is mild concentric left ventricular hypertrophy. Left ventricular diastolic parameters are  reconstruction technique. CONTRAST:  75mL OMNIPAQUE IOHEXOL 350 MG/ML SOLN COMPARISON:  None Available. FINDINGS: Cardiovascular: No significant coronary artery calcification. Global cardiac size is within normal limits. Small pericardial effusion. Central pulmonary arteries are enlarged in keeping with changes of pulmonary arterial hypertension. No intraluminal filling defect identified through the segmental level to suggest acute pulmonary embolism. The thoracic aorta is unremarkable. Mediastinum/Nodes: Visualized thyroid is unremarkable. No pathologic thoracic adenopathy. Nasoenteric feeding tube extends into the upper abdomen beyond the margin examination. Esophagus unremarkable. Lungs/Pleura: Small right pleural effusion. Mild bibasilar atelectasis. Lungs are otherwise clear. No pneumothorax. No central obstructing lesion. Upper Abdomen: No acute abnormality. Musculoskeletal: No chest wall abnormality. No acute or significant osseous findings. Review of the MIP images confirms the above findings. IMPRESSION: 1. No pulmonary embolism. 2. Small right pleural effusion. 3. Small pericardial effusion. 4.  Enlarged central pulmonary arteries in keeping with changes of pulmonary arterial hypertension. Electronically Signed   By: Helyn Numbers M.D.   On: 02/08/2023 00:27   ECHOCARDIOGRAM COMPLETE  Result Date: 02/07/2023    ECHOCARDIOGRAM REPORT   Patient Name:   [name] Date of Exam: 02/07/2023 Medical Rec #:  [id number]     Height:       63.0 in Accession #:    [id number]    Weight:       154.1 lb Date of Birth:  [birth year]     BSA:          1.731 m Patient Age:    51 years      BP:           102/62 mmHg Patient Gender: F             HR:           135 bpm. Exam Location:  Inpatient Procedure: 2D Echo, Color Doppler and Cardiac Doppler Indications:    Sinus Tachycardia  History:        Patient has prior history of Echocardiogram examinations, most                 recent 03/10/2022. Infectious Colitis and COPD,                 Arrythmias:Cardiac Arrest and Tachycardia; Risk Factors:Current                 Smoker.  Sonographer:    Milbert Coulter Referring Phys: 43 DAWOOD S ELGERGAWY IMPRESSIONS  1. Left ventricular ejection fraction, by estimation, is >75%. The left ventricle has hyperdynamic function. The left ventricle has no regional wall motion abnormalities. There is mild concentric left ventricular hypertrophy. Left ventricular diastolic parameters are indeterminate.  2. Right ventricular systolic function is normal. The right ventricular size is normal.  3. Pericardial effusion measuring up to 0.9 cm. Moderate pericardial effusion. There is no evidence of cardiac tamponade.  4. The mitral valve is normal in structure. No evidence of mitral valve regurgitation. No evidence of mitral stenosis.  5. The aortic valve is normal in structure. Aortic valve regurgitation is not visualized. No aortic stenosis is present.  6. The inferior vena cava is normal in size with greater than 50% respiratory variability, suggesting right atrial pressure of 3 mmHg. FINDINGS  Left Ventricle: Left ventricular ejection fraction,  by estimation, is >75%. The left ventricle has hyperdynamic function. The left ventricle has no regional wall motion abnormalities. The left ventricular internal cavity size was normal in size. There is mild concentric left ventricular hypertrophy. Left ventricular diastolic parameters are  indeterminate. Right Ventricle: The right ventricular size is normal. No increase in right ventricular wall thickness. Right ventricular systolic function is normal. Left Atrium: Left atrial size was normal in size. Right Atrium: Right atrial size was normal in size. Pericardium: Pericardial effusion measuring up to 0.9 cm. A moderately sized pericardial effusion is present. There is diastolic collapse of the right ventricular free wall. There is no evidence of cardiac tamponade. Mitral Valve: The mitral valve is normal in structure. No evidence of mitral valve regurgitation. No evidence of mitral valve stenosis. Tricuspid Valve: The tricuspid valve is normal in structure. Tricuspid valve regurgitation is not demonstrated. No evidence of tricuspid stenosis. Aortic Valve: The aortic valve is normal in structure. Aortic valve regurgitation is not visualized. No aortic stenosis is present. Pulmonic Valve: The pulmonic valve was normal in structure. Pulmonic valve regurgitation is not visualized. No evidence of pulmonic stenosis. Aorta: The aortic root is normal in size and structure. Venous: The inferior vena cava is normal in size with greater than 50% respiratory variability, suggesting right atrial pressure of 3 mmHg. IAS/Shunts: No atrial level shunt detected by color flow Doppler.  LEFT VENTRICLE PLAX 2D LVIDd:         4.20 cm LVIDs:         1.80 cm LV PW:         1.20 cm LV IVS:        1.20 cm LVOT diam:     1.90 cm LV SV:         78 LV SV Index:   45 LVOT Area:     2.84 cm  RIGHT VENTRICLE RV S prime:     17.70 cm/s TAPSE (M-mode): 2.5 cm LEFT ATRIUM             Index        RIGHT ATRIUM           Index LA Vol (A2C):   22.3  ml 12.88 ml/m  RA Area:     15.20 cm LA Vol (A4C):   32.8 ml 18.95 ml/m  RA Volume:   32.00 ml  18.49 ml/m LA Biplane Vol: 29.3 ml 16.93 ml/m  AORTIC VALVE LVOT Vmax:   143.00 cm/s LVOT Vmean:  100.000 cm/s LVOT VTI:    0.276 m  AORTA Ao Root diam: 3.20 cm Ao Asc diam:  3.20 cm  SHUNTS Systemic VTI:  0.28 m Systemic Diam: 1.90 cm Chilton Si MD Electronically signed by Chilton Si MD Signature Date/Time: 02/07/2023/5:23:03 PM    Final    VAS [place] LOWER EXTREMITY VENOUS (DVT)  Result Date: 02/07/2023  Lower Venous DVT Study Patient Name:  [name]  Date of Exam:   02/07/2023 Medical Rec #: [id number]      Accession #:    [id number] Date of Birth: 10-Nov-1970      Patient Gender: F Patient Age:   44 years Exam Location:  [facility] Procedure:      VAS [place] LOWER EXTREMITY VENOUS (DVT) Referring Phys: DAWOOD ELGERGAWY --------------------------------------------------------------------------------  Indications: Swelling.  Risk Factors: None identified. Comparison Study: No prior studies. Performing Technologist: Chanda Busing RVT  Examination Guidelines: A complete evaluation includes B-mode imaging, spectral Doppler, color Doppler, and power Doppler as needed of all accessible portions of each vessel. Bilateral testing is considered an integral part of a complete examination. Limited examinations for reoccurring indications may be performed as noted. The reflux portion of the exam is performed with the patient in reverse Trendelenburg.  +---------+---------------+---------+-----------+----------+--------------+

## 2023-02-21 NOTE — Progress Notes (Signed)
PROGRESS NOTE    [name]  WJX:[id number] DOB: 05-29-1970 DOA: 01/27/2023 PCP: [name], PA-C   Brief Narrative:  This is a 52 year old female with past medical history of COPD, Crohn's on Skyrizi, hypertension, type 2 diabetes mellitus who initially presented to the after being found unresponsive. Per family, patient has had bloody emesis with diarrhea over the past few days. Initial systolics in the 60s in the ED, and patient was minimally responsive. In the emergency room, patient was intubated, and patient was admitted to the ICU for further evaluation and management for shock and altered mental status.    01/27/2023: Admitted to ICU and intubated.  Felt to have septic shock of unclear source which resolved antibiotic therapy and fluid resuscitation.  Suspicion was that she may have had C. difficile colitis despite negative toxin.  She was treated with vancomycin. 9/29 Off pressors 10/2: Extubated 10/3 transfer to floor. 10/10 underwent repeat proctosigmoidoscopy for ongoing diarrhea.  Diffuse severe inflammation identified due to combination of Crohn's disease, active C. difficile infection and possible ischemic colitis from prior hypotension on presentation. 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  Intraoperative findings include frank necrosis with pelvic abscess formation and significant colitis. 10/12 some dilated small bowel with minimal OG output.  Continued observation advised.  Having significant postoperative pain. 10/13 became tachycardic and hypotensive.  More lethargic.  Transferred back to ICU. 10/15 blood cultures reports fungemia, resume PO vancomycin 10/17 CT abdomen pelvis notable for septic emboli - CTA chest on hold given IV access issues 10/18 TEE negative for endocarditis/bubble study - NG out for TEE probe.  Advance diet, hoping to avoid replacement of NG tube 10/19 wound VAC dysfunctional, surgery switching to wet-to-dry over the  weekend -pain poorly controlled today, add oxycodone (medication lists this is an allergy but patient verbally approved) 10/20 pain control improving, transition from IV fentanyl to Dilaudid, continue p.o. oxycodone, restart home anxiolytics with Xanax 10/21 Successful left subclavian CVC placement with IR   Assessment & Plan:   Principal Problem:   Fungemia Active Problems:   Metabolic encephalopathy   Shock (HCC)   Cardiac arrest (HCC)   Acute respiratory failure with hypoxia (HCC)   Upper GI bleed   Granulomatous colitis (HCC)   Pseudomembranous colitis   Crohn's disease without complication (HCC)   Ileus (HCC)   C. difficile diarrhea   Sepsis (HCC)   Pressure injury of skin   Hyponatremia   Severe protein-calorie malnutrition (HCC)   ABLA (acute blood loss anemia)   Pulmonary embolus (HCC)   Splenic infarct   Septic shock, multifactorial, POA Fungemia, candida glabrata Cdiff Colitis Septic emboli, thrombocytosis -Cultures reporting fungemia, fulminant C. difficile colitis and intra-abdominal abscess also notable sources for infection -ID following, appreciate insight recommendations - continue micafungin, vancomycin, ceftriaxone, Flagyl -Status post colectomy -CT abdomen pelvis shows splenic and pulmonary emboli -Left subclavian tunneled CVC placed 02/21/2023 with IR -DVT studies + for DVT of both upper extremities; BLE negative -TEE 10/18 negative for vegetations, bubble study negative **ID recommending neurology consult Monday the 21st for possible dilated funduscopic exam to rule out fungal endophthalmitis   Moderate to severe malnutrition Postoperative short bowel syndrome/high output ostomy Malabsorption -Diet liberalized to increase calorie count, recommend family bring in food from outside facility if necessary -NG tube remains out, calorie count ongoing per dietary, if caloric needs are not met will need to discuss replacement of NG tube -adding thiamine,  folic acid given chronic risk for malabsorption with crohn's and C  PROGRESS NOTE    [name]  WJX:[id number] DOB: 05-29-1970 DOA: 01/27/2023 PCP: [name], PA-C   Brief Narrative:  This is a 52 year old female with past medical history of COPD, Crohn's on Skyrizi, hypertension, type 2 diabetes mellitus who initially presented to the after being found unresponsive. Per family, patient has had bloody emesis with diarrhea over the past few days. Initial systolics in the 60s in the ED, and patient was minimally responsive. In the emergency room, patient was intubated, and patient was admitted to the ICU for further evaluation and management for shock and altered mental status.    01/27/2023: Admitted to ICU and intubated.  Felt to have septic shock of unclear source which resolved antibiotic therapy and fluid resuscitation.  Suspicion was that she may have had C. difficile colitis despite negative toxin.  She was treated with vancomycin. 9/29 Off pressors 10/2: Extubated 10/3 transfer to floor. 10/10 underwent repeat proctosigmoidoscopy for ongoing diarrhea.  Diffuse severe inflammation identified due to combination of Crohn's disease, active C. difficile infection and possible ischemic colitis from prior hypotension on presentation. 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  Intraoperative findings include frank necrosis with pelvic abscess formation and significant colitis. 10/12 some dilated small bowel with minimal OG output.  Continued observation advised.  Having significant postoperative pain. 10/13 became tachycardic and hypotensive.  More lethargic.  Transferred back to ICU. 10/15 blood cultures reports fungemia, resume PO vancomycin 10/17 CT abdomen pelvis notable for septic emboli - CTA chest on hold given IV access issues 10/18 TEE negative for endocarditis/bubble study - NG out for TEE probe.  Advance diet, hoping to avoid replacement of NG tube 10/19 wound VAC dysfunctional, surgery switching to wet-to-dry over the  weekend -pain poorly controlled today, add oxycodone (medication lists this is an allergy but patient verbally approved) 10/20 pain control improving, transition from IV fentanyl to Dilaudid, continue p.o. oxycodone, restart home anxiolytics with Xanax 10/21 Successful left subclavian CVC placement with IR   Assessment & Plan:   Principal Problem:   Fungemia Active Problems:   Metabolic encephalopathy   Shock (HCC)   Cardiac arrest (HCC)   Acute respiratory failure with hypoxia (HCC)   Upper GI bleed   Granulomatous colitis (HCC)   Pseudomembranous colitis   Crohn's disease without complication (HCC)   Ileus (HCC)   C. difficile diarrhea   Sepsis (HCC)   Pressure injury of skin   Hyponatremia   Severe protein-calorie malnutrition (HCC)   ABLA (acute blood loss anemia)   Pulmonary embolus (HCC)   Splenic infarct   Septic shock, multifactorial, POA Fungemia, candida glabrata Cdiff Colitis Septic emboli, thrombocytosis -Cultures reporting fungemia, fulminant C. difficile colitis and intra-abdominal abscess also notable sources for infection -ID following, appreciate insight recommendations - continue micafungin, vancomycin, ceftriaxone, Flagyl -Status post colectomy -CT abdomen pelvis shows splenic and pulmonary emboli -Left subclavian tunneled CVC placed 02/21/2023 with IR -DVT studies + for DVT of both upper extremities; BLE negative -TEE 10/18 negative for vegetations, bubble study negative **ID recommending neurology consult Monday the 21st for possible dilated funduscopic exam to rule out fungal endophthalmitis   Moderate to severe malnutrition Postoperative short bowel syndrome/high output ostomy Malabsorption -Diet liberalized to increase calorie count, recommend family bring in food from outside facility if necessary -NG tube remains out, calorie count ongoing per dietary, if caloric needs are not met will need to discuss replacement of NG tube -adding thiamine,  folic acid given chronic risk for malabsorption with crohn's and C  POP      Full           Yes      Yes                                 +---------+---------------+---------+-----------+----------+--------------+ PTV      Full                                                        +---------+---------------+---------+-----------+----------+--------------+ PERO     Full                                                        +---------+---------------+---------+-----------+----------+--------------+     Summary: BILATERAL: - No evidence of deep vein thrombosis seen in the lower extremities, bilaterally. -No evidence of popliteal cyst, bilaterally.   *See table(s) above for measurements and observations. Electronically signed by Sherald Hess MD on 02/19/2023 at 1:35:43 PM.    Final    VAS [place] UPPER EXTREMITY VENOUS DUPLEX  Result Date: 02/19/2023 UPPER VENOUS STUDY  Patient Name:  LARAH SHOWERS  Date of Exam:   02/19/2023 Medical Rec #: [id number]      Accession #:    [id number] Date of  Birth: Mar 19, 1971      Patient Gender: F Patient Age:   39 years Exam Location:  [facility] Procedure:      VAS [place] UPPER EXTREMITY VENOUS DUPLEX Referring Phys: CORNELIUS VAN DAM --------------------------------------------------------------------------------  Indications: pulmonary embolism Risk Factors: Immobility confirmed PE Surgery laparotomy 02/11/23 past pregnancy. Limitations: Bandages, line and patient positioning. Comparison Study: Significant changes seen since prior exam 12/30/21 Performing Technologist: Shona Simpson  Examination Guidelines: A complete evaluation includes B-mode imaging, spectral Doppler, color Doppler, and power Doppler as needed of all accessible portions of each vessel. Bilateral testing is considered an integral part of a complete examination. Limited examinations for reoccurring indications may be performed as noted.  Right Findings: +----------+------------+---------+-----------+-------------------+-------+ RIGHT     CompressiblePhasicitySpontaneous    Properties     Summary +----------+------------+---------+-----------+-------------------+-------+ IJV           None       No        No     rigid w/compression Acute  +----------+------------+---------+-----------+-------------------+-------+ Subclavian    Full       Yes       Yes                               +----------+------------+---------+-----------+-------------------+-------+ Axillary      Full       Yes       Yes                               +----------+------------+---------+-----------+-------------------+-------+ Brachial    Partial      Yes       Yes    brightly echogenic  Acute  +----------+------------+---------+-----------+-------------------+-------+ Radial        Full       Yes  POP      Full           Yes      Yes                                 +---------+---------------+---------+-----------+----------+--------------+ PTV      Full                                                        +---------+---------------+---------+-----------+----------+--------------+ PERO     Full                                                        +---------+---------------+---------+-----------+----------+--------------+     Summary: BILATERAL: - No evidence of deep vein thrombosis seen in the lower extremities, bilaterally. -No evidence of popliteal cyst, bilaterally.   *See table(s) above for measurements and observations. Electronically signed by Sherald Hess MD on 02/19/2023 at 1:35:43 PM.    Final    VAS [place] UPPER EXTREMITY VENOUS DUPLEX  Result Date: 02/19/2023 UPPER VENOUS STUDY  Patient Name:  LARAH SHOWERS  Date of Exam:   02/19/2023 Medical Rec #: [id number]      Accession #:    [id number] Date of  Birth: Mar 19, 1971      Patient Gender: F Patient Age:   39 years Exam Location:  [facility] Procedure:      VAS [place] UPPER EXTREMITY VENOUS DUPLEX Referring Phys: CORNELIUS VAN DAM --------------------------------------------------------------------------------  Indications: pulmonary embolism Risk Factors: Immobility confirmed PE Surgery laparotomy 02/11/23 past pregnancy. Limitations: Bandages, line and patient positioning. Comparison Study: Significant changes seen since prior exam 12/30/21 Performing Technologist: Shona Simpson  Examination Guidelines: A complete evaluation includes B-mode imaging, spectral Doppler, color Doppler, and power Doppler as needed of all accessible portions of each vessel. Bilateral testing is considered an integral part of a complete examination. Limited examinations for reoccurring indications may be performed as noted.  Right Findings: +----------+------------+---------+-----------+-------------------+-------+ RIGHT     CompressiblePhasicitySpontaneous    Properties     Summary +----------+------------+---------+-----------+-------------------+-------+ IJV           None       No        No     rigid w/compression Acute  +----------+------------+---------+-----------+-------------------+-------+ Subclavian    Full       Yes       Yes                               +----------+------------+---------+-----------+-------------------+-------+ Axillary      Full       Yes       Yes                               +----------+------------+---------+-----------+-------------------+-------+ Brachial    Partial      Yes       Yes    brightly echogenic  Acute  +----------+------------+---------+-----------+-------------------+-------+ Radial        Full       Yes  POP      Full           Yes      Yes                                 +---------+---------------+---------+-----------+----------+--------------+ PTV      Full                                                        +---------+---------------+---------+-----------+----------+--------------+ PERO     Full                                                        +---------+---------------+---------+-----------+----------+--------------+     Summary: BILATERAL: - No evidence of deep vein thrombosis seen in the lower extremities, bilaterally. -No evidence of popliteal cyst, bilaterally.   *See table(s) above for measurements and observations. Electronically signed by Sherald Hess MD on 02/19/2023 at 1:35:43 PM.    Final    VAS [place] UPPER EXTREMITY VENOUS DUPLEX  Result Date: 02/19/2023 UPPER VENOUS STUDY  Patient Name:  LARAH SHOWERS  Date of Exam:   02/19/2023 Medical Rec #: [id number]      Accession #:    [id number] Date of  Birth: Mar 19, 1971      Patient Gender: F Patient Age:   39 years Exam Location:  [facility] Procedure:      VAS [place] UPPER EXTREMITY VENOUS DUPLEX Referring Phys: CORNELIUS VAN DAM --------------------------------------------------------------------------------  Indications: pulmonary embolism Risk Factors: Immobility confirmed PE Surgery laparotomy 02/11/23 past pregnancy. Limitations: Bandages, line and patient positioning. Comparison Study: Significant changes seen since prior exam 12/30/21 Performing Technologist: Shona Simpson  Examination Guidelines: A complete evaluation includes B-mode imaging, spectral Doppler, color Doppler, and power Doppler as needed of all accessible portions of each vessel. Bilateral testing is considered an integral part of a complete examination. Limited examinations for reoccurring indications may be performed as noted.  Right Findings: +----------+------------+---------+-----------+-------------------+-------+ RIGHT     CompressiblePhasicitySpontaneous    Properties     Summary +----------+------------+---------+-----------+-------------------+-------+ IJV           None       No        No     rigid w/compression Acute  +----------+------------+---------+-----------+-------------------+-------+ Subclavian    Full       Yes       Yes                               +----------+------------+---------+-----------+-------------------+-------+ Axillary      Full       Yes       Yes                               +----------+------------+---------+-----------+-------------------+-------+ Brachial    Partial      Yes       Yes    brightly echogenic  Acute  +----------+------------+---------+-----------+-------------------+-------+ Radial        Full       Yes  POP      Full           Yes      Yes                                 +---------+---------------+---------+-----------+----------+--------------+ PTV      Full                                                        +---------+---------------+---------+-----------+----------+--------------+ PERO     Full                                                        +---------+---------------+---------+-----------+----------+--------------+     Summary: BILATERAL: - No evidence of deep vein thrombosis seen in the lower extremities, bilaterally. -No evidence of popliteal cyst, bilaterally.   *See table(s) above for measurements and observations. Electronically signed by Sherald Hess MD on 02/19/2023 at 1:35:43 PM.    Final    VAS [place] UPPER EXTREMITY VENOUS DUPLEX  Result Date: 02/19/2023 UPPER VENOUS STUDY  Patient Name:  LARAH SHOWERS  Date of Exam:   02/19/2023 Medical Rec #: [id number]      Accession #:    [id number] Date of  Birth: Mar 19, 1971      Patient Gender: F Patient Age:   39 years Exam Location:  [facility] Procedure:      VAS [place] UPPER EXTREMITY VENOUS DUPLEX Referring Phys: CORNELIUS VAN DAM --------------------------------------------------------------------------------  Indications: pulmonary embolism Risk Factors: Immobility confirmed PE Surgery laparotomy 02/11/23 past pregnancy. Limitations: Bandages, line and patient positioning. Comparison Study: Significant changes seen since prior exam 12/30/21 Performing Technologist: Shona Simpson  Examination Guidelines: A complete evaluation includes B-mode imaging, spectral Doppler, color Doppler, and power Doppler as needed of all accessible portions of each vessel. Bilateral testing is considered an integral part of a complete examination. Limited examinations for reoccurring indications may be performed as noted.  Right Findings: +----------+------------+---------+-----------+-------------------+-------+ RIGHT     CompressiblePhasicitySpontaneous    Properties     Summary +----------+------------+---------+-----------+-------------------+-------+ IJV           None       No        No     rigid w/compression Acute  +----------+------------+---------+-----------+-------------------+-------+ Subclavian    Full       Yes       Yes                               +----------+------------+---------+-----------+-------------------+-------+ Axillary      Full       Yes       Yes                               +----------+------------+---------+-----------+-------------------+-------+ Brachial    Partial      Yes       Yes    brightly echogenic  Acute  +----------+------------+---------+-----------+-------------------+-------+ Radial        Full       Yes  POP      Full           Yes      Yes                                 +---------+---------------+---------+-----------+----------+--------------+ PTV      Full                                                        +---------+---------------+---------+-----------+----------+--------------+ PERO     Full                                                        +---------+---------------+---------+-----------+----------+--------------+     Summary: BILATERAL: - No evidence of deep vein thrombosis seen in the lower extremities, bilaterally. -No evidence of popliteal cyst, bilaterally.   *See table(s) above for measurements and observations. Electronically signed by Sherald Hess MD on 02/19/2023 at 1:35:43 PM.    Final    VAS [place] UPPER EXTREMITY VENOUS DUPLEX  Result Date: 02/19/2023 UPPER VENOUS STUDY  Patient Name:  LARAH SHOWERS  Date of Exam:   02/19/2023 Medical Rec #: [id number]      Accession #:    [id number] Date of  Birth: Mar 19, 1971      Patient Gender: F Patient Age:   39 years Exam Location:  [facility] Procedure:      VAS [place] UPPER EXTREMITY VENOUS DUPLEX Referring Phys: CORNELIUS VAN DAM --------------------------------------------------------------------------------  Indications: pulmonary embolism Risk Factors: Immobility confirmed PE Surgery laparotomy 02/11/23 past pregnancy. Limitations: Bandages, line and patient positioning. Comparison Study: Significant changes seen since prior exam 12/30/21 Performing Technologist: Shona Simpson  Examination Guidelines: A complete evaluation includes B-mode imaging, spectral Doppler, color Doppler, and power Doppler as needed of all accessible portions of each vessel. Bilateral testing is considered an integral part of a complete examination. Limited examinations for reoccurring indications may be performed as noted.  Right Findings: +----------+------------+---------+-----------+-------------------+-------+ RIGHT     CompressiblePhasicitySpontaneous    Properties     Summary +----------+------------+---------+-----------+-------------------+-------+ IJV           None       No        No     rigid w/compression Acute  +----------+------------+---------+-----------+-------------------+-------+ Subclavian    Full       Yes       Yes                               +----------+------------+---------+-----------+-------------------+-------+ Axillary      Full       Yes       Yes                               +----------+------------+---------+-----------+-------------------+-------+ Brachial    Partial      Yes       Yes    brightly echogenic  Acute  +----------+------------+---------+-----------+-------------------+-------+ Radial        Full       Yes  PENDING  Incomplete  Culture, blood (Routine X 2) w Reflex to ID Panel     Status: None (Preliminary result)   Collection Time: 02/17/23 12:42 PM   Specimen: BLOOD LEFT HAND  Result Value Ref Range Status   Specimen Description BLOOD LEFT HAND  Final   Special Requests   Final    BOTTLES DRAWN AEROBIC ONLY Blood Culture adequate volume   Culture   Final    NO GROWTH 4 DAYS Performed at [facility], [address] [place], [place] [address]    Report Status PENDING  Incomplete    Radiology Studies: [place] EKG SITE RITE  Result Date: 02/20/2023 If Site Rite image not attached, placement could not be confirmed due to current cardiac rhythm.  VAS [place] LOWER EXTREMITY VENOUS (DVT)  Result Date: 02/19/2023  Lower Venous DVT Study Patient Name:  TYYNE ROUNSVILLE  Date of Exam:   02/19/2023 Medical Rec #: [id number]      Accession #:    [id number] Date of Birth: [birth year]      Patient Gender: F Patient Age:   83 years Exam Location:  [facility] Procedure:      VAS [place] LOWER EXTREMITY VENOUS (DVT) Referring Phys: CORNELIUS VAN DAM --------------------------------------------------------------------------------  Indications: Pulmonary embolism.  Risk Factors: Immobility confirmed PE Surgery laparotomy 02/11/23 past pregnancy. Comparison Study: No significant changes seen since previous exam 02/07/23 Performing Technologist: Shona Simpson  Examination Guidelines: A complete evaluation includes B-mode imaging, spectral Doppler, color Doppler, and power Doppler as needed of all accessible portions of each vessel. Bilateral testing is considered an integral part of a complete examination. Limited  examinations for reoccurring indications may be performed as noted. The reflux portion of the exam is performed with the patient in reverse Trendelenburg.  +---------+---------------+---------+-----------+----------+--------------+ RIGHT    CompressibilityPhasicitySpontaneityPropertiesThrombus Aging +---------+---------------+---------+-----------+----------+--------------+ CFV      Full           Yes      Yes                                 +---------+---------------+---------+-----------+----------+--------------+ SFJ      Full                                                        +---------+---------------+---------+-----------+----------+--------------+ FV Prox  Full                                                        +---------+---------------+---------+-----------+----------+--------------+ FV Mid   Full                                                        +---------+---------------+---------+-----------+----------+--------------+ FV DistalFull                                                        +---------+---------------+---------+-----------+----------+--------------+  PROGRESS NOTE    [name]  WJX:[id number] DOB: 05-29-1970 DOA: 01/27/2023 PCP: [name], PA-C   Brief Narrative:  This is a 52 year old female with past medical history of COPD, Crohn's on Skyrizi, hypertension, type 2 diabetes mellitus who initially presented to the after being found unresponsive. Per family, patient has had bloody emesis with diarrhea over the past few days. Initial systolics in the 60s in the ED, and patient was minimally responsive. In the emergency room, patient was intubated, and patient was admitted to the ICU for further evaluation and management for shock and altered mental status.    01/27/2023: Admitted to ICU and intubated.  Felt to have septic shock of unclear source which resolved antibiotic therapy and fluid resuscitation.  Suspicion was that she may have had C. difficile colitis despite negative toxin.  She was treated with vancomycin. 9/29 Off pressors 10/2: Extubated 10/3 transfer to floor. 10/10 underwent repeat proctosigmoidoscopy for ongoing diarrhea.  Diffuse severe inflammation identified due to combination of Crohn's disease, active C. difficile infection and possible ischemic colitis from prior hypotension on presentation. 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  Intraoperative findings include frank necrosis with pelvic abscess formation and significant colitis. 10/12 some dilated small bowel with minimal OG output.  Continued observation advised.  Having significant postoperative pain. 10/13 became tachycardic and hypotensive.  More lethargic.  Transferred back to ICU. 10/15 blood cultures reports fungemia, resume PO vancomycin 10/17 CT abdomen pelvis notable for septic emboli - CTA chest on hold given IV access issues 10/18 TEE negative for endocarditis/bubble study - NG out for TEE probe.  Advance diet, hoping to avoid replacement of NG tube 10/19 wound VAC dysfunctional, surgery switching to wet-to-dry over the  weekend -pain poorly controlled today, add oxycodone (medication lists this is an allergy but patient verbally approved) 10/20 pain control improving, transition from IV fentanyl to Dilaudid, continue p.o. oxycodone, restart home anxiolytics with Xanax 10/21 Successful left subclavian CVC placement with IR   Assessment & Plan:   Principal Problem:   Fungemia Active Problems:   Metabolic encephalopathy   Shock (HCC)   Cardiac arrest (HCC)   Acute respiratory failure with hypoxia (HCC)   Upper GI bleed   Granulomatous colitis (HCC)   Pseudomembranous colitis   Crohn's disease without complication (HCC)   Ileus (HCC)   C. difficile diarrhea   Sepsis (HCC)   Pressure injury of skin   Hyponatremia   Severe protein-calorie malnutrition (HCC)   ABLA (acute blood loss anemia)   Pulmonary embolus (HCC)   Splenic infarct   Septic shock, multifactorial, POA Fungemia, candida glabrata Cdiff Colitis Septic emboli, thrombocytosis -Cultures reporting fungemia, fulminant C. difficile colitis and intra-abdominal abscess also notable sources for infection -ID following, appreciate insight recommendations - continue micafungin, vancomycin, ceftriaxone, Flagyl -Status post colectomy -CT abdomen pelvis shows splenic and pulmonary emboli -Left subclavian tunneled CVC placed 02/21/2023 with IR -DVT studies + for DVT of both upper extremities; BLE negative -TEE 10/18 negative for vegetations, bubble study negative **ID recommending neurology consult Monday the 21st for possible dilated funduscopic exam to rule out fungal endophthalmitis   Moderate to severe malnutrition Postoperative short bowel syndrome/high output ostomy Malabsorption -Diet liberalized to increase calorie count, recommend family bring in food from outside facility if necessary -NG tube remains out, calorie count ongoing per dietary, if caloric needs are not met will need to discuss replacement of NG tube -adding thiamine,  folic acid given chronic risk for malabsorption with crohn's and C

## 2023-02-21 NOTE — Progress Notes (Signed)
SLP Cancellation Note  Patient Details Name: [name] MRN: [id number] DOB: [birth year]   Cancelled treatment:        Attempted to see for cognitive intervention and she declined stating she has been out of the room all morning and just got back from procedure. Same thing occurred 10/18 she was in procedure and declined to work with therapist. She states she feels that her thinking is getting close to baseline. She states she would work with me tomorrow.    Royce Macadamia 02/21/2023, 12:37 PM

## 2023-02-21 NOTE — Progress Notes (Signed)
Nutrition Follow-up  DOCUMENTATION CODES:   Not applicable  INTERVENTION:  Initiate calorie count Ensure Enlive po BID, each supplement provides 350 kcal and 20 grams of protein. Magic cup TID with meals, each supplement provides 290 kcal and 9 grams of protein MVI with minerals daily Pending calorie count results, consider placement of Cortrak and re-initiation of TF  NUTRITION DIAGNOSIS:   Inadequate oral intake related to inability to eat as evidenced by NPO status. - diet advanced; oral intake remains inadequate  GOAL:   Patient will meet greater than or equal to 90% of their needs - goal unmet  MONITOR:   Diet advancement, Skin, Labs, Weight trends, TF tolerance  REASON FOR ASSESSMENT:   Consult Assessment of nutrition requirement/status  ASSESSMENT:   52 year old female with past medical history of COPD, Crohn's on Skyrizi, hypertension, type 2 diabetes mellitus who initially presented to the after being found unresponsive.  10/18: s/p TEE; negative for endocarditis; Cortrak removed 10/19: VAC d/c and transitioned to WTD 10/20: diet advanced by Surgery to Regular  Pt off unit for PICC line placement by IR this morning. Second attempt at visiting with pt, she was pleasant but noticeably drowsy. Pt requesting RD order mashed potatoes with extra gravy and jello, order placed. Pt states that she did not eat this morning d/t being off unit.  Reviewed [facility] ordering system and noted since 10/16, pt has only received 2 meals per day, primarily missing breakfast and has been ordering mashed potatoes and jello for one of the 2 meals. She mentions that she was consuming protein supplements at home PTA (ensure enlive, not high protein).   Discussed pt with MD. Pt hoping to avoid Cortrak placement however suspect she would likely benefit from supplemental enteral nutrition. Will trial pt on ensure supplements given ileostomy output appears to have less significant output,  in addition to magic cup while on calorie count.    Meal completions: 10/16: 100% dinner 10/20: 100% lunch  Admission weight history: 09/26: 68.9 kg 10/21: 64.8 kg  Medications: ferrous sulfate BID, banatrol QID, folic acid, SSI 0-9 units q4h, imodium QID, MVI, protonix, thiamine, vanc, IV abx, micafungin  Labs: sodium 131, BUN <5, Cr 0.41, anion gap 16, alk phos 242, CBG's 109-126 x24 hours  NUTRITION - FOCUSED PHYSICAL EXAM:  Flowsheet Row Most Recent Value  Orbital Region No depletion  Upper Arm Region No depletion  Thoracic and Lumbar Region No depletion  Buccal Region No depletion  Temple Region Mild depletion  Clavicle Bone Region Mild depletion  Clavicle and Acromion Bone Region Mild depletion  Scapular Bone Region No depletion  Dorsal Hand Mild depletion  Patellar Region Mild depletion  Anterior Thigh Region Moderate depletion  Posterior Calf Region Moderate depletion  Edema (RD Assessment) None  Hair Reviewed  Eyes Reviewed  Mouth Reviewed  Skin Reviewed  Nails Reviewed       Diet Order:   Diet Order             Diet regular Room service appropriate? Yes; Fluid consistency: Thin  Diet effective now                   EDUCATION NEEDS:   Not appropriate for education at this time  Skin:  Skin Assessment: Skin Integrity Issues: Skin Integrity Issues:: Stage II, Wound VAC, Other (Comment) Stage II: lip (ETT holder) Wound Vac: abdomen (closed surgical incision) Other: MASD to perineum  Last BM:  x24 hours via ileostomy  Height:  Ht Readings from Last 1 Encounters:  02/02/23 5' 2.99" (1.6 m)    Weight:   Wt Readings from Last 1 Encounters:  02/21/23 64.8 kg    Ideal Body Weight:  52.3 kg  BMI:  Body mass index is 25.31 kg/m.  Estimated Nutritional Needs:   Kcal:  1700-1900 kcal/d  Protein:  85-100 g/d  Fluid:  >1.8L/d  [name], RDN, LDN Clinical Nutrition

## 2023-02-21 NOTE — Progress Notes (Signed)
3 Days Post-Op   Subjective/Chief Complaint: Had line placed in IR, tol diet, tired   Objective: Vital signs in last 24 hours: Temp:  [97.6 F (36.4 C)-98 F (36.7 C)] 98 F (36.7 C) (10/21 1155) Pulse Rate:  [108-115] 113 (10/21 0650) Resp:  [11-16] 14 (10/21 0650) BP: (112-133)/(50-52) 125/50 (10/21 1155) SpO2:  [93 %-95 %] 94 % (10/21 0650) Weight:  [64.8 kg] 64.8 kg (10/21 0426) Last BM Date : 02/21/23  Intake/Output from previous day: 10/20 0701 - 10/21 0700 In: 720 [P.O.:720] Out: 2230 [Urine:1360; Drains:20; Stool:850] Intake/Output this shift: Total I/O In: 240 [P.O.:240] Out: -   Ab soft approp tender ileostomy pink and functional would open with some exudate  Lab Results:  Recent Labs    02/20/23 0341 02/21/23 0326  WBC 35.2* 32.4*  HGB 10.0* 9.7*  HCT 31.1* 30.6*  PLT 1,097* 1,389*   BMET Recent Labs    02/19/23 0359 02/20/23 0341  NA 134* 131*  K 3.7 4.3  CL 95* 96*  CO2 24 19*  GLUCOSE 105* 99  BUN <5* <5*  CREATININE 0.51 0.41*  CALCIUM 9.6 8.8*   PT/INR No results for input(s): "LABPROT", "INR" in the last 72 hours. ABG No results for input(s): "PHART", "HCO3" in the last 72 hours.  Invalid input(s): "PCO2", "PO2"  Studies/Results: [place] EKG SITE RITE  Result Date: 02/20/2023 If Site Rite image not attached, placement could not be confirmed due to current cardiac rhythm.   Anti-infectives: Anti-infectives (From admission, onward)    Start     Dose/Rate Route Frequency Ordered Stop   02/18/23 1800  vancomycin (VANCOCIN) 50 mg/mL oral solution SOLN 125 mg        125 mg Oral 4 times daily 02/18/23 1439 02/26/23 0959   02/18/23 0400  micafungin (MYCAMINE) 150 mg in sodium chloride 0.9 % 100 mL IVPB        150 mg 107.5 mL/hr over 1 Hours Intravenous Every 24 hours 02/17/23 0936     02/17/23 0900  vancomycin (VANCOREADY) IVPB 750 mg/150 mL  Status:  Discontinued        750 mg 150 mL/hr over 60 Minutes Intravenous Every 8 hours  02/17/23 0204 02/17/23 0947   02/17/23 0400  micafungin (MYCAMINE) 100 mg in sodium chloride 0.9 % 100 mL IVPB  Status:  Discontinued        100 mg 105 mL/hr over 1 Hours Intravenous Every 24 hours 02/16/23 0353 02/17/23 0936   02/16/23 1045  vancomycin (VANCOCIN) 50 mg/mL oral solution SOLN 125 mg  Status:  Discontinued        125 mg Per Tube 4 times daily 02/16/23 0953 02/18/23 1439   02/16/23 0415  micafungin (MYCAMINE) 100 mg in sodium chloride 0.9 % 100 mL IVPB        100 mg 105 mL/hr over 1 Hours Intravenous STAT 02/16/23 0353 02/16/23 0610   02/15/23 1300  vancomycin (VANCOREADY) IVPB 750 mg/150 mL  Status:  Discontinued        750 mg 150 mL/hr over 60 Minutes Intravenous Every 12 hours 02/14/23 1156 02/17/23 0204   02/14/23 1130  vancomycin (VANCOCIN) IVPB 1000 mg/200 mL premix        1,000 mg 200 mL/hr over 60 Minutes Intravenous  Once 02/14/23 1043 02/14/23 1249   02/11/23 2000  cefTRIAXone (ROCEPHIN) 2 g in sodium chloride 0.9 % 100 mL IVPB       Note to Pharmacy: Pharmacy may adjust dosing strength / duration /  interval for maximal efficacy   2 g 200 mL/hr over 30 Minutes Intravenous Every 24 hours 02/11/23 1622     02/10/23 1600  metroNIDAZOLE (FLAGYL) IVPB 500 mg        500 mg 100 mL/hr over 60 Minutes Intravenous Every 12 hours 02/10/23 1444     02/08/23 1800  vancomycin (VANCOCIN) 50 mg/mL oral solution SOLN 500 mg        500 mg Per Tube Every 6 hours 02/08/23 1325 02/12/23 1759   02/03/23 2200  metroNIDAZOLE (FLAGYL) tablet 500 mg        500 mg Per Tube Every 12 hours 02/03/23 1406 02/06/23 2148   02/02/23 1815  vancomycin (VANCOCIN) 50 mg/mL oral solution SOLN 125 mg  Status:  Discontinued        125 mg Per Tube Every 6 hours 02/02/23 1717 02/08/23 1325   02/02/23 1545  vancomycin (VANCOCIN) capsule 125 mg  Status:  Discontinued        125 mg Oral 4 times daily 02/02/23 1530 02/02/23 1717   01/27/23 1430  cefTRIAXone (ROCEPHIN) 2 g in sodium chloride 0.9 % 100 mL IVPB         2 g 200 mL/hr over 30 Minutes Intravenous Every 24 hours 01/27/23 1340 02/05/23 1403   01/27/23 1400  metroNIDAZOLE (FLAGYL) IVPB 500 mg  Status:  Discontinued        500 mg 100 mL/hr over 60 Minutes Intravenous Every 12 hours 01/27/23 1340 02/03/23 1406   01/27/23 0545  vancomycin (VANCOREADY) IVPB 1250 mg/250 mL        1,250 mg 166.7 mL/hr over 90 Minutes Intravenous  Once 01/27/23 0542 01/27/23 0809   01/27/23 0545  ceFEPIme (MAXIPIME) 2 g in sodium chloride 0.9 % 100 mL IVPB        2 g 200 mL/hr over 30 Minutes Intravenous  Once 01/27/23 0542 01/27/23 0621   01/27/23 0545  metroNIDAZOLE (FLAGYL) IVPB 500 mg        500 mg 100 mL/hr over 60 Minutes Intravenous  Once 01/27/23 0542 01/27/23 0741       Assessment/Plan: Refractory Crohn's disease Fulminant C. Diff colitis with necrosis of proximal rectum and sigmoid colon, perforation of proximal rectum, large pelvic abscess  POD 10 s/p TAC and end ileostomy Dr. Luisa Hart  - no signs of undrained abdominal collection on CT 10/16 - TEE negative for endocarditis  - soft diet, QID imodium, fiber, iron - monitor output and titrate as needed (850 out) - no PICC/TPN in setting of bacteremia/possible fungemia  - Continue drain- should be able to be removed prior to dc - continue medical treatment for C.diff - mobilize - WOC following for new ileostomy and VAC -replace vac today   FEN: soft diet VTE: heparin gtt - acute DVT noted in BUE 10/19 ID: rocephin/flagyl/micafungin, PO vanc   - per CCM -  Acute DVT/PEs - [place] 10/19 with L brachial DVT and R internal jugular and brachial DVTs  Splenic infarcts - nothing surgical recommended at this time but agree with ID need to eval etiology of emboli  COPD HTN Chronic pain  Collagen vascular disease T2DM Seizure disorder  [name] 02/21/2023

## 2023-02-21 NOTE — Progress Notes (Signed)
Patient daughter is requesting information on results from vascular scan from yesterday. Notified MD, provided contact information.  Kenard Gower, RN

## 2023-02-21 NOTE — Consult Note (Signed)
WOC Nurse wound follow up Wound type: full thickness postop midline  Measurement:21 cm x 2.5 cm x 2 cm  Wound MWN:UUVOZ red, small area of tan fibrinous material L upper aspect  Drainage (amount, consistency, odor) minimal serosanguinous in cannister  Periwound: intact, ostomy to RLQ, JP drain to LLQ neither interferes with dressing  Dressing procedure/placement/frequency: Removed old NPWT dressing Cleansed wound with normal saline  Filled wound with  1 piece black foam; I did flatten out 1/2 of a barrier ring and placed at umbilicus  Sealed NPWT dressing at HG Patient received IV  pain medication per bedside nurse prior to dressing change Patient tolerated procedure well  WOC nurse will continue to provide NPWT dressing changes due to the complexity of the dressing change. Next due Thursday 02/24/2023; large dressing kit in room.    WOC Nurse ostomy follow up Stoma type/location: RLQ ileostomy  Stomal assessment/size: 1 1/2", red moist, slightly above skin level; some mucosal separation noted from 9 to 11 o'clock  Peristomal assessment: intact, midline to left of ostomy as above  Treatment options for stomal/peristomal skin: I placed a small piece of Aquacel to mucosal separation then covered with 2" barrier ring  Output  approximately 100 mls brown effluent in pouch  Ostomy pouching: 1pc convex  Education provided: Discussed with patient that pouch will need to be emptied several times a day when 1/3 to 1/2 full and that entire pouching system would need to be changed 2 times a week and as needed for leaking.  Patient is not interested in learning, says it has been "one thing after another".  I encouraged patient that she will need to able to care for her ostomy at home when discharged.  Patient agrees however does not look at ostomy or participate at all in ostomy pouch change.  Patient says she does have a daughter and we talked about WOC team working with daughter on ostomy care.   No definitive plans made.   Placed 1 piece convex [place] [phone] today.  Patient has 1 more of these but also has (4) sets of 2 3/4" convex skin barrier [place] [phone]) and 2 3/4" pouches [place] [phone]).     Enrolled patient in [place] Secure Start Discharge program: Yes  WOC  team will continue to follow for ostomy education and support.   Thank you,    Priscella Mann MSN, RN-BC, [employer] [phone]

## 2023-02-21 NOTE — Progress Notes (Signed)
MD notified pt HR mid 120s.   Kenard Gower, RN

## 2023-02-22 DIAGNOSIS — B49 Unspecified mycosis: Secondary | ICD-10-CM | POA: Diagnosis not present

## 2023-02-22 DIAGNOSIS — A0472 Enterocolitis due to Clostridium difficile, not specified as recurrent: Secondary | ICD-10-CM | POA: Diagnosis not present

## 2023-02-22 DIAGNOSIS — B379 Candidiasis, unspecified: Secondary | ICD-10-CM | POA: Diagnosis not present

## 2023-02-22 DIAGNOSIS — K5010 Crohn's disease of large intestine without complications: Secondary | ICD-10-CM | POA: Diagnosis not present

## 2023-02-22 DIAGNOSIS — K567 Ileus, unspecified: Secondary | ICD-10-CM | POA: Diagnosis not present

## 2023-02-22 LAB — CBC WITH DIFFERENTIAL/PLATELET
Abs Immature Granulocytes: 1.28 10*3/uL — ABNORMAL HIGH (ref 0.00–0.07)
Basophils Absolute: 0.2 10*3/uL — ABNORMAL HIGH (ref 0.0–0.1)
Basophils Relative: 1 %
Eosinophils Absolute: 0.4 10*3/uL (ref 0.0–0.5)
Eosinophils Relative: 2 %
HCT: 30 % — ABNORMAL LOW (ref 36.0–46.0)
Hemoglobin: 9.6 g/dL — ABNORMAL LOW (ref 12.0–15.0)
Immature Granulocytes: 5 %
Lymphocytes Relative: 12 %
Lymphs Abs: 3.3 10*3/uL (ref 0.7–4.0)
MCH: 27.7 pg (ref 26.0–34.0)
MCHC: 32 g/dL (ref 30.0–36.0)
MCV: 86.5 fL (ref 80.0–100.0)
Monocytes Absolute: 1.9 10*3/uL — ABNORMAL HIGH (ref 0.1–1.0)
Monocytes Relative: 7 %
Neutro Abs: 20.3 10*3/uL — ABNORMAL HIGH (ref 1.7–7.7)
Neutrophils Relative %: 73 %
Platelets: 1383 10*3/uL (ref 150–400)
RBC: 3.47 MIL/uL — ABNORMAL LOW (ref 3.87–5.11)
RDW: 16 % — ABNORMAL HIGH (ref 11.5–15.5)
WBC: 27.3 10*3/uL — ABNORMAL HIGH (ref 4.0–10.5)
nRBC: 0 % (ref 0.0–0.2)

## 2023-02-22 LAB — CULTURE, BLOOD (ROUTINE X 2)
Culture: NO GROWTH
Culture: NO GROWTH
Special Requests: ADEQUATE
Special Requests: ADEQUATE

## 2023-02-22 LAB — GLUCOSE, CAPILLARY
Glucose-Capillary: 103 mg/dL — ABNORMAL HIGH (ref 70–99)
Glucose-Capillary: 104 mg/dL — ABNORMAL HIGH (ref 70–99)
Glucose-Capillary: 118 mg/dL — ABNORMAL HIGH (ref 70–99)
Glucose-Capillary: 119 mg/dL — ABNORMAL HIGH (ref 70–99)
Glucose-Capillary: 120 mg/dL — ABNORMAL HIGH (ref 70–99)
Glucose-Capillary: 123 mg/dL — ABNORMAL HIGH (ref 70–99)
Glucose-Capillary: 123 mg/dL — ABNORMAL HIGH (ref 70–99)

## 2023-02-22 LAB — MISC LABCORP TEST (SEND OUT): Labcorp test code: 183119

## 2023-02-22 LAB — HEPARIN LEVEL (UNFRACTIONATED): Heparin Unfractionated: 0.66 [IU]/mL (ref 0.30–0.70)

## 2023-02-22 MED ORDER — ACETAMINOPHEN 325 MG PO TABS
650.0000 mg | ORAL_TABLET | Freq: Four times a day (QID) | ORAL | Status: DC
  Administered 2023-02-22 – 2023-03-04 (×38): 650 mg via ORAL
  Filled 2023-02-22 (×38): qty 2

## 2023-02-22 MED ORDER — ENSURE ENLIVE PO LIQD
237.0000 mL | Freq: Three times a day (TID) | ORAL | Status: DC
  Administered 2023-02-22 – 2023-03-16 (×32): 237 mL via ORAL

## 2023-02-22 NOTE — Progress Notes (Signed)
Physical Therapy Treatment Patient Details Name: [name] MRN: [id number] DOB: 1970/08/09 Today's Date: 02/22/2023   History of Present Illness 52 y.o. female presents to [facility] on 01/27/2023 after being found unresponsive at home down in a pool of bloody vomit/stool. CPR was initiated upon arrival to ED, pt intubated and started on pressors. Pt undergoing management for inflammatory vs infections colitis. Pt extubated on 10/2. 10/10 underwent repeat proctosigmoidoscopy for ongoing diarrhea.  Diffuse severe inflammation identified due to combination of Crohn's disease, active C. difficile infection and possible ischemic colitis from prior hypotension on presentation. 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 10/13 became tachycardic and hypotensive.  More lethargic.  Transferred back to ICU. PMH includes COPD, Crohn's disease, HTN, DMII.    PT Comments  Pt received in supine and agreeable to mobility with encouragement. Pt reports being soiled and requests assist with changing the bedpad. Pt able to sit to EOB with light min A, demonstrating improved mobility despite reported increased pain. Pt able to scoot forward to EOB and laterally towards HOB without assist. Pt able to tolerate limited seated BLE exercise, but declines OOB mobility. Pt continues to benefit from PT services to progress toward functional mobility goals.    If plan is discharge home, recommend the following: A lot of help with bathing/dressing/bathroom;Two people to help with walking and/or transfers;Assistance with cooking/housework;Direct supervision/assist for medications management;Assist for transportation;Supervision due to cognitive status;Direct supervision/assist for financial management;Help with stairs or ramp for entrance   Can travel by private vehicle     No  Equipment Recommendations  Rolling walker (2 wheels);BSC/3in1;Wheelchair (measurements PT);Wheelchair cushion (measurements PT)     Recommendations for Other Services       Precautions / Restrictions Precautions Precautions: Fall;Other (comment) Precaution Comments: watch HR/RR, bulb drain L, new colostomy, abdominal wound vac Restrictions Weight Bearing Restrictions: No     Mobility  Bed Mobility Overal bed mobility: Needs Assistance Bed Mobility: Rolling, Sidelying to Sit, Sit to Supine Rolling: Contact guard assist, Used rails Sidelying to sit: Used rails, Min assist, HOB elevated   Sit to supine: Min assist   General bed mobility comments: Light min A for trunk elevation and BLE elevation to EOB    Transfers                   General transfer comment: Pt declined      Balance Overall balance assessment: Needs assistance Sitting-balance support: Feet supported, Bilateral upper extremity supported Sitting balance-Leahy Scale: Fair Sitting balance - Comments: sitting EOB                                    Cognition Arousal: Alert Behavior During Therapy: Flat affect Overall Cognitive Status: Impaired/Different from baseline                                          Exercises General Exercises - Lower Extremity Long Arc Quad: AROM, Seated, Both, 5 reps    General Comments        Pertinent Vitals/Pain Pain Assessment Pain Assessment: 0-10 Pain Score: 9  Pain Location: abdomen Pain Descriptors / Indicators: Discomfort, Grimacing, Guarding Pain Intervention(s): RN gave pain meds during session, Monitored during session, Limited activity within patient's tolerance, Repositioned     PT Goals (current goals can now be found  in the care plan section) Acute Rehab PT Goals Patient Stated Goal: go home PT Goal Formulation: With patient Time For Goal Achievement: 02/17/23 Progress towards PT goals: Progressing toward goals    Frequency    Min 1X/week       AM-PAC PT "6 Clicks" Mobility   Outcome Measure  Help needed turning from your back  to your side while in a flat bed without using bedrails?: A Little Help needed moving from lying on your back to sitting on the side of a flat bed without using bedrails?: A Little Help needed moving to and from a bed to a chair (including a wheelchair)?: A Lot Help needed standing up from a chair using your arms (e.g., wheelchair or bedside chair)?: A Little Help needed to walk in hospital room?: A Lot Help needed climbing 3-5 steps with a railing? : Total 6 Click Score: 14    End of Session   Activity Tolerance: Patient limited by fatigue;Patient limited by pain Patient left: in bed;with call bell/phone within reach Nurse Communication: Mobility status PT Visit Diagnosis: Other abnormalities of gait and mobility (R26.89);Muscle weakness (generalized) (M62.81)     Time: 1610-9604 PT Time Calculation (min) (ACUTE ONLY): 24 min  Charges:    $Therapeutic Exercise: 8-22 mins $Therapeutic Activity: 8-22 mins PT General Charges $$ ACUTE PT VISIT: 1 Visit                     [name], PTA Acute Rehabilitation Services Secure Chat Preferred  [phone]    [name] 02/22/2023, 2:54 PM

## 2023-02-22 NOTE — Plan of Care (Signed)
  Problem: Clinical Measurements: Goal: Respiratory complications will improve Outcome: Progressing   Problem: Nutrition: Goal: Adequate nutrition will be maintained Outcome: Progressing   Problem: Pain Managment: Goal: General experience of comfort will improve Outcome: Progressing    

## 2023-02-22 NOTE — Progress Notes (Signed)
PROGRESS NOTE    [name]  YNW:[id number] DOB: 03/11/1971 DOA: 01/27/2023 PCP: [name], PA-C   Brief Narrative:  This is a 52 year old female with past medical history of COPD, Crohn's on Skyrizi, hypertension, type 2 diabetes mellitus who initially presented to the after being found unresponsive. Per family, patient has had bloody emesis with diarrhea over the past few days. Initial systolics in the 60s in the ED, and patient was minimally responsive. In the emergency room, patient was intubated, and patient was admitted to the ICU for further evaluation and management for shock and altered mental status.    01/27/2023: Admitted to ICU and intubated.  Felt to have septic shock of unclear source which resolved antibiotic therapy and fluid resuscitation.  Suspicion was that she may have had C. difficile colitis despite negative toxin.  She was treated with vancomycin. 9/29 Off pressors 10/2: Extubated 10/3 transfer to floor. 10/10 underwent repeat proctosigmoidoscopy for ongoing diarrhea.  Diffuse severe inflammation identified due to combination of Crohn's disease, active C. difficile infection and possible ischemic colitis from prior hypotension on presentation. 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  Intraoperative findings include frank necrosis with pelvic abscess formation and significant colitis. 10/12 some dilated small bowel with minimal OG output.  Continued observation advised.  Having significant postoperative pain. 10/13 became tachycardic and hypotensive.  More lethargic.  Transferred back to ICU. 10/15 blood cultures reports fungemia, resume PO vancomycin 10/17 CT abdomen pelvis notable for septic emboli - CTA chest on hold given IV access issues 10/18 TEE negative for endocarditis/bubble study - NG out for TEE probe.  Advance diet, hoping to avoid replacement of NG tube 10/19 wound VAC dysfunctional, surgery switching to wet-to-dry over the  weekend -pain poorly controlled today, add oxycodone (medication lists this is an allergy but patient verbally approved) 10/20 pain control improving, transition from IV fentanyl to Dilaudid, continue p.o. oxycodone, restart home anxiolytics with Xanax 10/21 Successful left subclavian CVC placement with IR 10/22 ophthalmology consulted in regards to dilated exam in the setting of candidemia -awaiting callback   Assessment & Plan:   Principal Problem:   Fungemia Active Problems:   Metabolic encephalopathy   Shock (HCC)   Cardiac arrest (HCC)   Acute respiratory failure with hypoxia (HCC)   Upper GI bleed   Granulomatous colitis (HCC)   Pseudomembranous colitis   Crohn's disease without complication (HCC)   Ileus (HCC)   C. difficile diarrhea   Sepsis (HCC)   Pressure injury of skin   Hyponatremia   Severe protein-calorie malnutrition (HCC)   ABLA (acute blood loss anemia)   Pulmonary embolus (HCC)   Splenic infarct   Bandemia   Septic shock, multifactorial, POA Fungemia, candida glabrata, likely POA Cdiff Colitis, POA Septic emboli, thrombocytosis -Cultures reporting fungemia, fulminant C. difficile colitis improving with concurrent intra-abdominal abscess  -ID following, appreciate insight recommendations - continue micafungin, PO vancomycin, ceftriaxone, Flagyl -Status post colectomy -CT abdomen pelvis shows splenic and pulmonary emboli -Left subclavian tunneled CVC placed 02/21/2023 with IR -DVT studies + for DVT of both upper extremities; BLE negative -TEE 10/18 negative for vegetations, bubble study negative **ID recommending neurology consult Monday the 21st for possible dilated funduscopic exam to rule out fungal endophthalmitis   Moderate to severe malnutrition Postoperative short bowel syndrome/high output ostomy, improving Malabsorption likely acute on chronic -Diet liberalized to increase calorie count, recommend family bring in food from outside facility if  necessary -NG tube remains out, calorie count ongoing per dietary, if caloric  needs are not met will need to discuss replacement of NG tube tube versus increased dietary supplementation -adding thiamine, folic acid in addition to above  Infectious colitis due to C. difficile on treatment, POA -See above -High output ostomy, continue to supplement p.o. and IV hydration as appropriate -P.o. vancomycin ongoing per ID  Intractable pain, multifactorial -Combination of postoperative pain, colitis, septic emboli as well as fungemia -Moderate improvement with initiation of p.o. oxycodone, allergy for oxycodone and Dilaudid deleted out of patient's chart per our discussion and tolerance of these medications -Continues with fentanyl patch, low-dose  Thrombocytosis  -Likely secondary to above infection - likely playing role in septic emboli -Appreciate heme-onc insight recommendations -follow labs (Iron, ESR, CRP, BCR-ABL, CML, ALL, Jak2)  Longstanding Crohn's disease; not chronically on steroids so less concern for adrenal insufficiency -Hold steroids/Skyrizi in the setting of ongoing infection   Type 2 diabetes -SSI PRN, has not needed much insulin currently due to poor p.o. intake -Goal BG 140-180   History of seizures -Continue PTA keppra/gabapentin   History of anxiety -Resume home Xanax, Cymbalta, hydroxyzine   History of hypertension -Hold home antihypertensives given infection/sepsis and prior hypotension. Resume as appropriate.   History of COPD -Continue xopenex PRN -Switch PTA spiriva to yupelri per forumulary   GERD -Continue PTA PPI   Anemia  -Transfuse for Hb <7 or hemodynamically significant bleeding   Hypokalemia/hypomagnesemia -Repleted, monitor  Cardiac arrest secondary to septic shock requiring CPR on admission Echocardiogram on admit within normal limits  Crohn's disease - well controlled on Skyrizi Unclear if this plays a role in her overall condition  Crohn's colitis/ischemic colitis/C. difficile all playing a role and not a candidate at this time for [facility] or any other immunosuppressants   Pericardial effusion without tamponade Will need 6 to 8-week follow-up with Dr. Diona Browner in the outpatient setting for repeat echo   Seizure disorder Continue Keppra   Non-insulin-dependent diabetes type 2, well-controlled  -A1c 5.8 -advance diet as tolerated, continue hypoglycemic protocol and sliding scale insulin   DVT prophylaxis: SCDs Start: 01/27/23 1111 heparin gtt ongoing Code Status:   Code Status: Full Code Family Communication: Daughter updated over the phone  Status is: Inpatient  Dispo: The patient is from: Home              Anticipated d/c is to: TBD              Anticipated d/c date is: To be determined              Patient currently not medically stable for discharge  Consultants:  PCCM, ID, general surgery  Procedures:  Colectomy with ostomy placement  Antimicrobials:  Ceftriaxone, Flagyl, micafungin, PO vancomycin  Subjective: No acute issues or events overnight, tolerating line placement quite well, denies further episodes of intractable abdominal or back pain.  Denies nausea vomiting diarrhea constipation headache fevers chills or chest pain.  Reports improvement in ostomy output.  Objective: Vitals:   02/21/23 1714 02/21/23 2100 02/21/23 2336 02/22/23 0351  BP: (!) 139/48 (!) 134/57 (!) 118/54 129/64  Pulse: (!) 122 (!) 107 (!) 114 (!) 115  Resp: 17 19 15 13   Temp: 98 F (36.7 C) 98 F (36.7 C) 98.2 F (36.8 C) 97.7 F (36.5 C)  TempSrc: Oral Oral Oral Oral  SpO2: 95% 91% 92% 94%  Weight:      Height:        Intake/Output Summary (Last 24 hours) at 02/22/2023 0745 Last data filed at  02/22/2023 0622 Gross per 24 hour  Intake 2121.57 ml  Output 1580 ml  Net 541.57 ml   Filed Weights   02/18/23 0333 02/20/23 0520 02/21/23 0426  Weight: 66.6 kg 65 kg 64.8 kg    Examination:  General exam:  Appears calm and comfortable  Respiratory system: Clear to auscultation. Respiratory effort normal. Cardiovascular system: S1 & S2 heard, RRR. No JVD, murmurs, rubs, gallops or clicks. No pedal edema. Gastrointestinal system: midline incision noted, wound VAC poorly attached,; ostomy clean/dry putting out liquid brown stool Central nervous system: Alert and oriented. No focal neurological deficits. Extremities: Symmetric 5 x 5 power. Skin: No rashes, lesions or ulcers Psychiatry: Judgement and insight appear normal. Mood & affect appropriate.   Data Reviewed: I have personally reviewed following labs and imaging studies  CBC: Recent Labs  Lab 02/18/23 0539 02/19/23 0359 02/20/23 0341 02/21/23 0326 02/22/23 0500  WBC 39.9* 32.6* 35.2* 32.4* 27.3*  NEUTROABS  --   --   --   --  20.3*  HGB 9.8* 9.6* 10.0* 9.7* 9.6*  HCT 30.8* 30.3* 31.1* 30.6* 30.0*  MCV 87.0 85.6 86.1 86.0 86.5  PLT 556* 1,103* 1,097* 1,389* 1,383*   Basic Metabolic Panel: Recent Labs  Lab 02/16/23 0413 02/17/23 0321 02/18/23 0539 02/19/23 0359 02/20/23 0341  NA 138 136 134* 134* 131*  K 3.8 3.3* 4.4 3.7 4.3  CL 101 97* 99 95* 96*  CO2 25 24 22 24  19*  GLUCOSE 109* 134* 111* 105* 99  BUN <5* 5* <5* <5* <5*  CREATININE 0.48 0.37* 0.45 0.51 0.41*  CALCIUM 8.9 9.0 9.1 9.6 8.8*  MG 2.0 1.6* 1.9  --   --    GFR: Estimated Creatinine Clearance: 73.7 mL/min (A) (by C-G formula based on SCr of 0.41 mg/dL (L)).  Liver Function Tests: Recent Labs  Lab 02/16/23 0413 02/17/23 0321 02/18/23 0539 02/19/23 0359 02/20/23 0341  AST 14* 14* 14* 15 17  ALT 8 9 7 10 7   ALKPHOS 135* 132* 139* 161* 242*  BILITOT 0.5 0.4 0.5 0.5 0.6  PROT 5.3* 5.3* 5.6* 5.9* 6.0*  ALBUMIN 2.9* 2.6* 2.5* 2.7* 2.7*   CBG: Recent Labs  Lab 02/21/23 1204 02/21/23 1718 02/21/23 2024 02/21/23 2336 02/22/23 0350  GLUCAP 100* 112* 100* 91 118*   Sepsis Labs: No results for input(s): "PROCALCITON", "LATICACIDVEN" in the last  168 hours.   Recent Results (from the past 240 hour(s))  MRSA Next Gen by PCR, Nasal     Status: None   Collection Time: 02/13/23  8:12 AM   Specimen: Nasal Mucosa; Nasal Swab  Result Value Ref Range Status   MRSA by PCR Next Gen NOT DETECTED NOT DETECTED Final    Comment: (NOTE) The GeneXpert MRSA Assay (FDA approved for NASAL specimens only), is one component of a comprehensive MRSA colonization surveillance program. It is not intended to diagnose MRSA infection nor to guide or monitor treatment for MRSA infections. Test performance is not FDA approved in patients less than 39 years old. Performed at [facility], [address] [place], [place] [address]   Culture, blood (Routine X 2) w Reflex to ID Panel     Status: None   Collection Time: 02/17/23 12:42 PM   Specimen: BLOOD LEFT HAND  Result Value Ref Range Status   Specimen Description BLOOD LEFT HAND  Final   Special Requests   Final    BOTTLES DRAWN AEROBIC ONLY Blood Culture adequate volume   Culture  Final    NO GROWTH 5 DAYS Performed at [facility], [address] [place], [place] [address]    Report Status 02/22/2023 FINAL  Final  Culture, blood (Routine X 2) w Reflex to ID Panel     Status: None   Collection Time: 02/17/23 12:42 PM   Specimen: BLOOD LEFT HAND  Result Value Ref Range Status   Specimen Description BLOOD LEFT HAND  Final   Special Requests   Final    BOTTLES DRAWN AEROBIC ONLY Blood Culture adequate volume   Culture   Final    NO GROWTH 5 DAYS Performed at [facility], [address] [place], [place] [address]    Report Status 02/22/2023 FINAL  Final    Radiology Studies: IR Fluoro Guide CV Line Left  Result Date: 02/21/2023 INDICATION: Possible central line placement w/ bilateral UE DVT. Poor IV access. EXAM: ULTRASOUND AND FLUOROSCOPIC GUIDED PLACEMENT OF TUNNELED CENTRAL VENOUS CATHETER MEDICATIONS: None. ANESTHESIA/SEDATION: Local anesthetic was administered.  FLUOROSCOPY TIME:  Fluoroscopic dose; 1 mGy COMPLICATIONS: None immediate. PROCEDURE: Informed written consent was obtained from the patient and/or patient's representative after a discussion of the risks, benefits, and alternatives to treatment. Questions regarding the procedure were encouraged and answered. A few attempts at RIGHT upper extremity peripherally inserted central catheter (PICC) placement were performed, with inability to thread the microwire into the axillary and subclavian vein. Tunneled central venous catheter placement was then recommended. The LEFT neck and chest were prepped with chlorhexidine in a sterile fashion, and a sterile drape was applied covering the operative field. Maximum barrier sterile technique with sterile gowns and gloves were used for the procedure. A timeout was performed prior to the initiation of the procedure. After the overlying soft tissues were anesthetized, a small venotomy incision was created and a micropuncture kit was utilized to access the internal jugular vein. Real-time ultrasound guidance was utilized for vascular access including the acquisition of a permanent ultrasound image documenting patency of the accessed vessel. The microwire was utilized to measure appropriate catheter length. The micropuncture sheath was exchanged for a peel-away sheath over a guidewire. A 5 Fr dual lumen tunneled central venous catheter measuring 23 cm was tunneled in a retrograde fashion from the anterior chest wall to the venotomy incision. The catheter was then placed through the peel-away sheath with tip ultimately positioned at the superior caval-atrial junction. Final catheter positioning was confirmed and documented with a spot radiographic image. The catheter aspirates and flushes normally. The catheter was flushed with appropriate volume heparin dwells. The catheter exit site was secured with a 2-0 Ethilon retention suture. The venotomy incision was closed with Dermabond.  Dressings were applied. The patient tolerated the procedure well without immediate post procedural complication. FINDINGS: *Aborted attempts at RIGHT upper extremity PICC placement. *After tunneled central venous catheter placement, the tip lies within the superior cavoatrial junction. The catheter aspirates and flushes normally and is ready for immediate use. IMPRESSION: Successful placement of 23 cm dual lumen tunneled central venous catheter via the LEFT internal jugular vein The tip of the catheter is positioned at the superior cavo-atrial junction. The catheter is ready for immediate use. Roanna Banning, MD [facility] Radiology Electronically Signed   By: Roanna Banning M.D.   On: 02/21/2023 14:52   IR US Guide Vasc Access Left  Result Date: 02/21/2023 INDICATION: Possible central line placement w/ bilateral UE DVT. Poor IV access. EXAM: ULTRASOUND AND FLUOROSCOPIC GUIDED PLACEMENT OF TUNNELED  CENTRAL VENOUS CATHETER MEDICATIONS: None. ANESTHESIA/SEDATION: Local anesthetic was administered. FLUOROSCOPY TIME:  Fluoroscopic dose; 1 mGy COMPLICATIONS: None immediate. PROCEDURE: Informed written consent was obtained from the patient and/or patient's representative after a discussion of the risks, benefits, and alternatives to treatment. Questions regarding the procedure were encouraged and answered. A few attempts at RIGHT upper extremity peripherally inserted central catheter (PICC) placement were performed, with inability to thread the microwire into the axillary and subclavian vein. Tunneled central venous catheter placement was then recommended. The LEFT neck and chest were prepped with chlorhexidine in a sterile fashion, and a sterile drape was applied covering the operative field. Maximum barrier sterile technique with sterile gowns and gloves were used for the procedure. A timeout was performed prior to the initiation of the procedure. After the overlying  soft tissues were anesthetized, a small venotomy incision was created and a micropuncture kit was utilized to access the internal jugular vein. Real-time ultrasound guidance was utilized for vascular access including the acquisition of a permanent ultrasound image documenting patency of the accessed vessel. The microwire was utilized to measure appropriate catheter length. The micropuncture sheath was exchanged for a peel-away sheath over a guidewire. A 5 Fr dual lumen tunneled central venous catheter measuring 23 cm was tunneled in a retrograde fashion from the anterior chest wall to the venotomy incision. The catheter was then placed through the peel-away sheath with tip ultimately positioned at the superior caval-atrial junction. Final catheter positioning was confirmed and documented with a spot radiographic image. The catheter aspirates and flushes normally. The catheter was flushed with appropriate volume heparin dwells. The catheter exit site was secured with a 2-0 Ethilon retention suture. The venotomy incision was closed with Dermabond. Dressings were applied. The patient tolerated the procedure well without immediate post procedural complication. FINDINGS: *Aborted attempts at RIGHT upper extremity PICC placement. *After tunneled central venous catheter placement, the tip lies within the superior cavoatrial junction. The catheter aspirates and flushes normally and is ready for immediate use. IMPRESSION: Successful placement of 23 cm dual lumen tunneled central venous catheter via the LEFT internal jugular vein The tip of the catheter is positioned at the superior cavo-atrial junction. The catheter is ready for immediate use. Roanna Banning, MD [facility] Radiology Electronically Signed   By: Roanna Banning M.D.   On: 02/21/2023 14:52   [place] EKG SITE RITE  Result Date: 02/20/2023 If Site Rite image not attached, placement could not be confirmed due to current  cardiac rhythm.       Scheduled Meds:  acetaminophen (TYLENOL) oral liquid 160 mg/5 mL  1,000 mg Oral Q6H   amitriptyline  25 mg Oral QHS   Chlorhexidine Gluconate Cloth  6 each Topical Daily   DULoxetine  30 mg Oral Daily   feeding supplement  237 mL Oral BID BM   fentaNYL  1 patch Transdermal Q72H   ferrous sulfate  325 mg Oral BID WC   fiber supplement (BANATROL TF)  60 mL Oral QID   folic acid  1 mg Intravenous Daily   gabapentin  100 mg Oral Q8H   Gerhardt's butt cream   Topical BID   insulin aspart  0-9 Units Subcutaneous Q4H   lidocaine  2 patch Transdermal Q24H   lidocaine  20 mL Infiltration Once   loperamide  4 mg Oral QID   methocarbamol  1,000 mg Oral TID   multivitamin with minerals  1 tablet Oral Daily   pantoprazole (PROTONIX) IV  40 mg Intravenous Q24H   revefenacin  175 mcg Nebulization Daily   thiamine (VITAMIN B1) injection  100 mg Intravenous Daily   vancomycin  125 mg Oral QID   Continuous Infusions:  cefTRIAXone (ROCEPHIN)  IV 2 g (02/21/23 2030)   heparin 2,050 Units/hr (02/22/23 0440)   levETIRAcetam 750 mg (02/21/23 2119)   metronidazole 500 mg (02/22/23 0622)   micafungin (MYCAMINE) 150 mg in sodium chloride 0.9 % 100 mL IVPB 150 mg (02/22/23 0444)     LOS: 26 days   Time spent:  Azucena Fallen, DO [facility]  If 7PM-7AM, please contact night-coverage [email or web address]  02/22/2023, 7:45 AM

## 2023-02-22 NOTE — Progress Notes (Signed)
OT Cancellation Note  Patient Details Name: [name] MRN: [id number] DOB: July 06, 1970   Cancelled Treatment:    Reason Eval/Treat Not Completed: Patient declined, no reason specified;Pain limiting ability to participate Pt declined to participate in OT session citing pain. RN at bedside to administer meds. Offered lighter activities though pt still declined.   Lorre Munroe 02/22/2023, 12:31 PM

## 2023-02-22 NOTE — Progress Notes (Signed)
PHARMACY - ANTICOAGULATION CONSULT NOTE  Pharmacy Consult for heparin Indication: pulmonary embolus (no boluses per surgery due to recent colectomy)  Allergies  Allergen Reactions   Codeine Shortness Of Breath, Swelling and Rash    Throat swelling   Dilaudid [Hydromorphone Hcl] Shortness Of Breath and Swelling   Hydrocodone Shortness Of Breath, Swelling and Rash   Ketorolac Nausea And Vomiting and Swelling    Pt reports n/v and swelling to throat   Morphine And Codeine Shortness Of Breath, Swelling and Rash   Penicillins Shortness Of Breath, Swelling and Other (See Comments)    Throat swells 02/06/21--TOLERATES CEFTRIAXONE     Nickel Rash   Pregabalin Other (See Comments)   Acetaminophen     Per MD patient states she can't take Tylenol because of liver enzymes   Ativan [Lorazepam] Other (See Comments)    Migraines.   Strawberry Extract Swelling   Watermelon Concentrate Swelling   Albuterol Palpitations   Benadryl [Diphenhydramine Hcl] Palpitations   Humira [Adalimumab] Rash   Latex Rash   Remicade [Infliximab] Rash    Blisters and Welts    Tramadol Rash    Rash and itching    Patient Measurements: Height: 5' 2.99" (160 cm) Weight: 64.8 kg (142 lb 13.7 oz) IBW/kg (Calculated) : 52.38 Heparin Dosing Weight: 68.8  Vital Signs: Temp: 97.7 F (36.5 C) (10/22 0351) Temp Source: Oral (10/22 0351) BP: 129/64 (10/22 0351) Pulse Rate: 115 (10/22 0351)  Labs: Recent Labs    02/20/23 0341 02/20/23 1116 02/21/23 0038 02/21/23 0326 02/21/23 0800 02/22/23 0500  HGB 10.0*  --   --  9.7*  --  9.6*  HCT 31.1*  --   --  30.6*  --  30.0*  PLT 1,097*  --   --  1,389*  --  1,383*  HEPARINUNFRC 0.22*   < > 0.86*  --  0.47 0.66  CREATININE 0.41*  --   --   --   --   --    < > = values in this interval not displayed.    Estimated Creatinine Clearance: 73.7 mL/min (A) (by C-G formula based on SCr of 0.41 mg/dL (L)).   Assessment: Patient with PMH of COPD, HTN, Crohn's, DM,  GERD, asthma, anxiety, head injury, IBS, PTSD, PSVT, and seizure admitted on 9/26 after being found down at home with bloody emesis. She underwent CRP x 5 min with ROSC. She underwent a flexible sigmoidoscopy-she was found to have changes consistent with pseudomembranous colitis. 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 On CT AP 10/16 there was noted splenic infarcts and pulm emboli. Pharmacy consulted on 10/16 to dose heparin without boluses due to recent colectomy.   Heparin level came back therapeutic at 0.66, on 2050 units/hr. Hgb 9.6, plt elevated at 1383. No s/sx of bleeding or infusion issues.   Goal of Therapy:  Heparin level 0.3-0.7 units/ml Monitor platelets by anticoagulation protocol: Yes   Plan:  Cont heparin to 2050 units/h Daily heparin level   Thank you for allowing pharmacy to participate in this patient's care,  Sherron Monday, PharmD, BCCCP Clinical Pharmacist  Phone: [phone] 02/22/2023 7:27 AM  Please check AMION for all [facility] Pharmacy phone numbers After 10:00 PM, call Main Pharmacy [phone]

## 2023-02-22 NOTE — Progress Notes (Signed)
[facility]  Date of Admission:  01/27/2023     Principal Problem:   Fungemia Active Problems:   Shock (HCC)   Metabolic encephalopathy   Cardiac arrest (HCC)   Acute respiratory failure with hypoxia (HCC)   Upper GI bleed   Granulomatous colitis (HCC)   Pseudomembranous colitis   Crohn's disease without complication (HCC)   Ileus (HCC)   C. 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  TEE did not show vegetations.  Repeat blood cultures on 10/17 remain negative. - Central line placed on 10/21 -Upper extremity and lower extremity Dopplers are negative #Intrabdominal infection Repeat CT is reassuring few locules of free air in abdomen likely postoperative.  Continue ceftriaxone and metronidazole for now #C diff illietis - On 9/29 C. difficile antigen and PCR positive, toxin negative -Diarrhea is improving.  Continue p.o. vancomycin for now Recommendations: -Continue micafungin, follow-up with Candida glabrata sensitivities.  Needs to be seen by ophthalmology or neuro in setting of candidemia.  Given infarcts, will treat for 6 weeks as empiric endocarditis coverage - Continue ceftriaxone and metronidazole for now - Patient reports diarrhea is improving she is on p.o. vancomycin 4 times daily - Monitor clinically   Microbiology:   Antibiotics: Ceftriaxone, metronidazole, micafungin, p.o. vancomycin  SUBJECTIVE: Resting in bed.  Reports diarrhea is improving. Interval: Afebrile overnight, WBC 27K  Review of Systems: Review of Systems  All other systems reviewed and are negative.    Scheduled Meds:  acetaminophen  650  mg Oral Q6H   amitriptyline  25 mg Oral QHS   Chlorhexidine Gluconate Cloth  6 each Topical Daily   DULoxetine  30 mg Oral Daily   feeding supplement  237 mL Oral TID BM   fentaNYL  1 patch Transdermal Q72H   ferrous sulfate  325 mg Oral BID WC   folic acid  1 mg Intravenous Daily   gabapentin  100 mg Oral Q8H   Gerhardt's butt cream   Topical BID   insulin aspart  0-9 Units Subcutaneous Q4H   lidocaine  2 patch Transdermal Q24H   lidocaine  20 mL Infiltration Once   loperamide  4 mg Oral QID   methocarbamol  1,000 mg Oral TID   multivitamin with minerals  1 tablet Oral Daily   pantoprazole (PROTONIX) IV  40 mg Intravenous Q24H   revefenacin  175 mcg Nebulization Daily   thiamine (VITAMIN B1) injection  100 mg Intravenous Daily   vancomycin  125 mg Oral QID   Continuous Infusions:  cefTRIAXone (ROCEPHIN)  IV 2 g (02/21/23 2030)   heparin 2,050 Units/hr (02/22/23 1848)   levETIRAcetam 750 mg (02/22/23 1134)   metronidazole 500 mg (02/22/23 1752)   micafungin (MYCAMINE) 150 mg in sodium chloride 0.9 % 100 mL IVPB 150 mg (02/22/23 0444)   PRN Meds:.ALPRAZolam, bismuth subsalicylate, dextrose, HYDROmorphone (DILAUDID) injection, hydrOXYzine, levalbuterol, magic mouthwash, metoprolol tartrate, ondansetron (ZOFRAN) IV, mouth rinse, oxyCODONE Allergies  Allergen Reactions   Codeine Shortness Of Breath, Swelling and Rash    Throat swelling   Hydrocodone Shortness Of Breath, Swelling and Rash  Ketorolac Nausea And Vomiting and Swelling    Pt reports n/v and swelling to throat   Morphine And Codeine Shortness Of Breath, Swelling and Rash   Penicillins Shortness Of Breath, Swelling and Other (See Comments)    Throat swells 02/06/21--TOLERATES CEFTRIAXONE     Nickel Rash   Pregabalin Other (See Comments)   Acetaminophen     Per MD patient states she can't take Tylenol because of liver enzymes   Ativan [Lorazepam] Other (See Comments)    Migraines.   Strawberry Extract Swelling    Watermelon Concentrate Swelling   Albuterol Palpitations   Benadryl [Diphenhydramine Hcl] Palpitations   Humira [Adalimumab] Rash   Latex Rash   Remicade [Infliximab] Rash    Blisters and Welts    Tramadol Rash    Rash and itching    OBJECTIVE: Vitals:   02/22/23 0900 02/22/23 1201 02/22/23 1729 02/22/23 1951  BP:  (!) 123/58 (!) 129/52 124/70  Pulse: (!) 116 (!) 119 (!) 117 (!) 117  Resp: 15 13 14 13   Temp:  (!) 97.4 F (36.3 C) 98 F (36.7 C) 98 F (36.7 C)  TempSrc:  Oral Axillary Oral  SpO2: 100% 94% 95% 91%  Weight:      Height:       Body mass index is 25.31 kg/m.  Physical Exam Constitutional:      Appearance: Normal appearance.  HENT:     Head: Normocephalic and atraumatic.     Right Ear: Tympanic membrane normal.     Left Ear: Tympanic membrane normal.     Nose: Nose normal.     Mouth/Throat:     Mouth: Mucous membranes are moist.  Eyes:     Extraocular Movements: Extraocular movements intact.     Conjunctiva/sclera: Conjunctivae normal.     Pupils: Pupils are equal, round, and reactive to light.  Cardiovascular:     Rate and Rhythm: Normal rate and regular rhythm.     Heart sounds: No murmur heard.    No friction rub. No gallop.  Pulmonary:     Effort: Pulmonary effort is normal.     Breath sounds: Normal breath sounds.  Musculoskeletal:        General: Normal range of motion.  Skin:    General: Skin is warm and dry.  Neurological:     General: No focal deficit present.     Mental Status: She is alert and oriented to person, place, and time.  Psychiatric:        Mood and Affect: Mood normal.   RUQ colostomy LLQ drain    Lab Results Lab Results  Component Value Date   WBC 27.3 (H) 02/22/2023   HGB 9.6 (L) 02/22/2023   HCT 30.0 (L) 02/22/2023   MCV 86.5 02/22/2023   PLT 1,383 (HH) 02/22/2023    Lab Results  Component Value Date   CREATININE 0.41 (L) 02/20/2023   BUN <5 (L) 02/20/2023   NA 131 (L) 02/20/2023   K 4.3 02/20/2023    CL 96 (L) 02/20/2023   CO2 19 (L) 02/20/2023    Lab Results  Component Value Date   ALT 7 02/20/2023   AST 17 02/20/2023   ALKPHOS 242 (H) 02/20/2023   BILITOT 0.6 02/20/2023        Danelle Earthly, MD [facility] 02/22/2023, 9:59 PM I have personally spent 52 minutes involved in face-to-face and non-face-to-face activities for this patient on the day of the visit. Professional  time spent includes the following activities: Preparing to see the patient (review of tests), Obtaining and/or reviewing separately obtained history (admission/discharge record), Performing a medically appropriate examination and/or evaluation , Ordering medications/tests/procedures, referring and communicating with other health care professionals, Documenting clinical information in the EMR, Independently interpreting results (not separately reported), Communicating results to the patient/family/caregiver, Counseling and educating the patient/family/caregiver and Care coordination (not separately reported).

## 2023-02-22 NOTE — Progress Notes (Signed)
4 Days Post-Op   Subjective/Chief Complaint: Patient is requesting pain and nausea medicine. Drinking an ensure. Calorie count continues. She tells me that she is not getting OOB due to pain.  Objective: Vital signs in last 24 hours: Temp:  [97.7 F (36.5 C)-98.2 F (36.8 C)] 97.7 F (36.5 C) (10/22 0758) Pulse Rate:  [107-125] 115 (10/22 0351) Resp:  [13-19] 13 (10/22 0351) BP: (118-139)/(48-64) 125/58 (10/22 0758) SpO2:  [91 %-95 %] 94 % (10/22 0758) Last BM Date :  (colostomy)  Intake/Output from previous day: 10/21 0701 - 10/22 0700 In: 2121.6 [P.O.:1200; IV Piggyback:921.6] Out: 1580 [Urine:1050; Drains:30; Stool:500] Intake/Output this shift: No intake/output data recorded.  Ab soft approp tender ileostomy pink and productive of watery, bilious stool. LLQ/SP blake drain with purulent output   Lab Results:  Recent Labs    02/21/23 0326 02/22/23 0500  WBC 32.4* 27.3*  HGB 9.7* 9.6*  HCT 30.6* 30.0*  PLT 1,389* 1,383*   BMET Recent Labs    02/20/23 0341  NA 131*  K 4.3  CL 96*  CO2 19*  GLUCOSE 99  BUN <5*  CREATININE 0.41*  CALCIUM 8.8*   PT/INR No results for input(s): "LABPROT", "INR" in the last 72 hours. ABG No results for input(s): "PHART", "HCO3" in the last 72 hours.  Invalid input(s): "PCO2", "PO2"  Studies/Results: IR Fluoro Guide CV Line Left  Result Date: 02/21/2023 INDICATION: Possible central line placement w/ bilateral UE DVT. Poor IV access. EXAM: ULTRASOUND AND FLUOROSCOPIC GUIDED PLACEMENT OF TUNNELED CENTRAL VENOUS CATHETER MEDICATIONS: None. ANESTHESIA/SEDATION: Local anesthetic was administered. FLUOROSCOPY TIME:  Fluoroscopic dose; 1 mGy COMPLICATIONS: None immediate. PROCEDURE: Informed written consent was obtained from the patient and/or patient's representative after a discussion of the risks, benefits, and alternatives to treatment. Questions regarding the procedure were encouraged and answered. A few attempts at RIGHT upper  extremity peripherally inserted central catheter (PICC) placement were performed, with inability to thread the microwire into the axillary and subclavian vein. Tunneled central venous catheter placement was then recommended. The LEFT neck and chest were prepped with chlorhexidine in a sterile fashion, and a sterile drape was applied covering the operative field. Maximum barrier sterile technique with sterile gowns and gloves were used for the procedure. A timeout was performed prior to the initiation of the procedure. After the overlying soft tissues were anesthetized, a small venotomy incision was created and a micropuncture kit was utilized to access the internal jugular vein. Real-time ultrasound guidance was utilized for vascular access including the acquisition of a permanent ultrasound image documenting patency of the accessed vessel. The microwire was utilized to measure appropriate catheter length. The micropuncture sheath was exchanged for a peel-away sheath over a guidewire. A 5 Fr dual lumen tunneled central venous catheter measuring 23 cm was tunneled in a retrograde fashion from the anterior chest wall to the venotomy incision. The catheter was then placed through the peel-away sheath with tip ultimately positioned at the superior caval-atrial junction. Final catheter positioning was confirmed and documented with a spot radiographic image. The catheter aspirates and flushes normally. The catheter was flushed with appropriate volume heparin dwells. The catheter exit site was secured with a 2-0 Ethilon retention suture. The venotomy incision was closed with Dermabond. Dressings were applied. The patient tolerated the procedure well without immediate post procedural complication. FINDINGS: *Aborted attempts at RIGHT upper extremity PICC placement. *After tunneled central venous catheter placement, the tip lies within the superior cavoatrial junction. The catheter aspirates and flushes normally and is  ready  for immediate use. IMPRESSION: Successful placement of 23 cm dual lumen tunneled central venous catheter via the LEFT internal jugular vein The tip of the catheter is positioned at the superior cavo-atrial junction. The catheter is ready for immediate use. Roanna Banning, MD [facility] Electronically Signed   By: Roanna Banning M.D.   On: 02/21/2023 14:52   IR US Guide Vasc Access Left  Result Date: 02/21/2023 INDICATION: Possible central line placement w/ bilateral UE DVT. Poor IV access. EXAM: ULTRASOUND AND FLUOROSCOPIC GUIDED PLACEMENT OF TUNNELED CENTRAL VENOUS CATHETER MEDICATIONS: None. ANESTHESIA/SEDATION: Local anesthetic was administered. FLUOROSCOPY TIME:  Fluoroscopic dose; 1 mGy COMPLICATIONS: None immediate. PROCEDURE: Informed written consent was obtained from the patient and/or patient's representative after a discussion of the risks, benefits, and alternatives to treatment. Questions regarding the procedure were encouraged and answered. A few attempts at RIGHT upper extremity peripherally inserted central catheter (PICC) placement were performed, with inability to thread the microwire into the axillary and subclavian vein. Tunneled central venous catheter placement was then recommended. The LEFT neck and chest were prepped with chlorhexidine in a sterile fashion, and a sterile drape was applied covering the operative field. Maximum barrier sterile technique with sterile gowns and gloves were used for the procedure. A timeout was performed prior to the initiation of the procedure. After the overlying soft tissues were anesthetized, a small venotomy incision was created and a micropuncture kit was utilized to access the internal jugular vein. Real-time ultrasound guidance was utilized for vascular access including the acquisition of a permanent ultrasound image documenting patency of the accessed vessel. The microwire was utilized to measure  appropriate catheter length. The micropuncture sheath was exchanged for a peel-away sheath over a guidewire. A 5 Fr dual lumen tunneled central venous catheter measuring 23 cm was tunneled in a retrograde fashion from the anterior chest wall to the venotomy incision. The catheter was then placed through the peel-away sheath with tip ultimately positioned at the superior caval-atrial junction. Final catheter positioning was confirmed and documented with a spot radiographic image. The catheter aspirates and flushes normally. The catheter was flushed with appropriate volume heparin dwells. The catheter exit site was secured with a 2-0 Ethilon retention suture. The venotomy incision was closed with Dermabond. Dressings were applied. The patient tolerated the procedure well without immediate post procedural complication. FINDINGS: *Aborted attempts at RIGHT upper extremity PICC placement. *After tunneled central venous catheter placement, the tip lies within the superior cavoatrial junction. The catheter aspirates and flushes normally and is ready for immediate use. IMPRESSION: Successful placement of 23 cm dual lumen tunneled central venous catheter via the LEFT internal jugular vein The tip of the catheter is positioned at the superior cavo-atrial junction. The catheter is ready for immediate use. Roanna Banning, MD [facility] Electronically Signed   By: Roanna Banning M.D.   On: 02/21/2023 14:52   [place] EKG SITE RITE  Result Date: 02/20/2023 If Site Rite image not attached, placement could not be confirmed due to current cardiac rhythm.   Anti-infectives: Anti-infectives (From admission, onward)    Start     Dose/Rate Route Frequency Ordered Stop   02/18/23 1800  vancomycin (VANCOCIN) 50 mg/mL oral solution SOLN 125 mg        125 mg Oral 4 times daily 02/18/23 1439 02/26/23 0959   02/18/23 0400  micafungin (MYCAMINE) 150 mg in sodium chloride 0.9 % 100  mL IVPB  150 mg 107.5 mL/hr over 1 Hours Intravenous Every 24 hours 02/17/23 0936     02/17/23 0900  vancomycin (VANCOREADY) IVPB 750 mg/150 mL  Status:  Discontinued        750 mg 150 mL/hr over 60 Minutes Intravenous Every 8 hours 02/17/23 0204 02/17/23 0947   02/17/23 0400  micafungin (MYCAMINE) 100 mg in sodium chloride 0.9 % 100 mL IVPB  Status:  Discontinued        100 mg 105 mL/hr over 1 Hours Intravenous Every 24 hours 02/16/23 0353 02/17/23 0936   02/16/23 1045  vancomycin (VANCOCIN) 50 mg/mL oral solution SOLN 125 mg  Status:  Discontinued        125 mg Per Tube 4 times daily 02/16/23 0953 02/18/23 1439   02/16/23 0415  micafungin (MYCAMINE) 100 mg in sodium chloride 0.9 % 100 mL IVPB        100 mg 105 mL/hr over 1 Hours Intravenous STAT 02/16/23 0353 02/16/23 0610   02/15/23 1300  vancomycin (VANCOREADY) IVPB 750 mg/150 mL  Status:  Discontinued        750 mg 150 mL/hr over 60 Minutes Intravenous Every 12 hours 02/14/23 1156 02/17/23 0204   02/14/23 1130  vancomycin (VANCOCIN) IVPB 1000 mg/200 mL premix        1,000 mg 200 mL/hr over 60 Minutes Intravenous  Once 02/14/23 1043 02/14/23 1249   02/11/23 2000  cefTRIAXone (ROCEPHIN) 2 g in sodium chloride 0.9 % 100 mL IVPB       Note to Pharmacy: Pharmacy may adjust dosing strength / duration / interval for maximal efficacy   2 g 200 mL/hr over 30 Minutes Intravenous Every 24 hours 02/11/23 1622     02/10/23 1600  metroNIDAZOLE (FLAGYL) IVPB 500 mg        500 mg 100 mL/hr over 60 Minutes Intravenous Every 12 hours 02/10/23 1444     02/08/23 1800  vancomycin (VANCOCIN) 50 mg/mL oral solution SOLN 500 mg        500 mg Per Tube Every 6 hours 02/08/23 1325 02/12/23 1759   02/03/23 2200  metroNIDAZOLE (FLAGYL) tablet 500 mg        500 mg Per Tube Every 12 hours 02/03/23 1406 02/06/23 2148   02/02/23 1815  vancomycin (VANCOCIN) 50 mg/mL oral solution SOLN 125 mg  Status:  Discontinued        125 mg Per Tube Every 6 hours  02/02/23 1717 02/08/23 1325   02/02/23 1545  vancomycin (VANCOCIN) capsule 125 mg  Status:  Discontinued        125 mg Oral 4 times daily 02/02/23 1530 02/02/23 1717   01/27/23 1430  cefTRIAXone (ROCEPHIN) 2 g in sodium chloride 0.9 % 100 mL IVPB        2 g 200 mL/hr over 30 Minutes Intravenous Every 24 hours 01/27/23 1340 02/05/23 1403   01/27/23 1400  metroNIDAZOLE (FLAGYL) IVPB 500 mg  Status:  Discontinued        500 mg 100 mL/hr over 60 Minutes Intravenous Every 12 hours 01/27/23 1340 02/03/23 1406   01/27/23 0545  vancomycin (VANCOREADY) IVPB 1250 mg/250 mL        1,250 mg 166.7 mL/hr over 90 Minutes Intravenous  Once 01/27/23 0542 01/27/23 0809   01/27/23 0545  ceFEPIme (MAXIPIME) 2 g in sodium chloride 0.9 % 100 mL IVPB        2 g 200 mL/hr over 30 Minutes Intravenous  Once 01/27/23 0542 01/27/23 7846  01/27/23 0545  metroNIDAZOLE (FLAGYL) IVPB 500 mg        500 mg 100 mL/hr over 60 Minutes Intravenous  Once 01/27/23 0542 01/27/23 0741       Assessment/Plan: 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 Luisa Hart 02/11/23 - no signs of undrained abdominal collection on CT 10/16 - TEE negative for endocarditis  - soft diet, QID imodium, fiber, iron - monitor output and titrate as needed (<1,000 out) - no PICC/TPN in setting of bacteremia/possible fungemia (BCx 10/17 NGTD), ID following for mangement of c.dif and other infectious w/u - Continue drain for now.  - mobilize - WOC following for new ileostomy and VAC M/WF - CCS will continue to follow peripherally, will plan to see the patient again on Thursday or Friday of this week. Please call with any conerns.    FEN: soft diet VTE: heparin gtt - acute DVT noted in BUE 10/19 ID: rocephin/flagyl/micafungin, PO vanc   - per CCM -  Acute DVT/PEs - [place] 10/19 with L brachial DVT  and R internal jugular and brachial DVTs  Splenic infarcts - nothing surgical recommended at this time but agree with ID need to eval etiology of emboli  COPD HTN Chronic pain  Collagen vascular disease T2DM Seizure disorder  Adam Phenix 02/22/2023

## 2023-02-22 NOTE — Progress Notes (Signed)
Calorie Count Note  48 hour calorie count ordered.  Diet: regular  Supplements: Ensure BID, Magic Cup TID  Estimated Nutritional Needs:  Kcal:  1700-1900 kcal/d Protein:  85-100 g/d Fluid:  >1.8L/d  Breakfast: none Lunch: 68 kcal and 2g protein Dinner: none documented, pt unable to recall Supplements: 350 kcal and 20g protein  Total intake: 418 kcal (25% of minimum estimated needs)  22 protein (26% of minimum estimated needs)  Spoke with pt at bedside. She reports that thusfar today, she has not wanted breakfast, nor lunch. Only intake consumed was 1 ensure today. Will place pt on ordering with assistance for ease of placing meal orders. She is not consuming magic cup, as ice cream "does not agree" with her.   Nutrition Dx: Inadequate oral intake related to inability to eat as evidenced by NPO status.   Goal: Patient will meet greater than or equal to 90% of their needs   Intervention:  Initiate calorie count Ensure Enlive po BID, each supplement provides 350 kcal and 20 grams of protein. Discontinue magic cup Try Carnation Breakfast Essentials TID, each packet mixed with 8 ounces of 2% milk provides 13 grams of protein and 260 calories. MVI with minerals daily Pending calorie count results, consider placement of Cortrak and re-initiation of TF  Drusilla Kanner, RDN, LDN Clinical Nutrition

## 2023-02-22 NOTE — Progress Notes (Signed)
Speech Language Pathology Treatment: Cognitive-Linquistic  Patient Details Name: [name] MRN: [id number] DOB: 07/29/1970 Today's Date: 02/22/2023 Time: 0102-7253 SLP Time Calculation (min) (ACUTE ONLY): 13 min  Assessment / Plan / Recommendation Clinical Impression  Pt agreeable to cognitive treatment. She is oriented to month, year, day of week (independently used calendar on wall). She looked at a mock menu and answered questions with 100% accuracy. For calculations she stated she wasn't supposed to move her right hand due to the IV therefore SLP wrote calculation and with visual input she was able to add amounts for accurate sum. Given hypothetical problem solving related to hospital she was able to state solution without cues needed. Pt's cognitive ability for the acute venue of care appears to be within functional limits. She may need follow up at next venue of care for more complex cognitive abilities in preparation for going home whenever she is able. ST will sign off at this time.    HPI HPI: 52 yo female admitted 9/26 after found down for an unspecified amount of time with possible GIB, agonal breathing and pulseless with CPR 5 min. Intubated in [facility] ED and transferred to [facility]. Pt with AKI, elevated lactate, septic shock. 10/2 extubated and EGD. On 10/11 underwent total abdominal colectomy with ileostomy. 10/13 became tachycardic and hypotensive.  More lethargic. Transferred back to ICU. PMhx: COPD, chron's, HTN, DM, PTSD, seizure disorder, chronic back pain      SLP Plan  All goals met      Recommendations for follow up therapy are one component of a multi-disciplinary discharge planning process, led by the attending physician.  Recommendations may be updated based on patient status, additional functional criteria and insurance authorization.    Recommendations                     Oral care BID   Set up Supervision/Assistance Cognitive communication deficit  (R41.841)     All goals met     Royce Macadamia  02/22/2023, 9:35 AM

## 2023-02-23 ENCOUNTER — Other Ambulatory Visit (HOSPITAL_COMMUNITY): Payer: Self-pay

## 2023-02-23 DIAGNOSIS — A0472 Enterocolitis due to Clostridium difficile, not specified as recurrent: Secondary | ICD-10-CM | POA: Diagnosis not present

## 2023-02-23 DIAGNOSIS — I2699 Other pulmonary embolism without acute cor pulmonale: Secondary | ICD-10-CM | POA: Diagnosis not present

## 2023-02-23 DIAGNOSIS — B49 Unspecified mycosis: Secondary | ICD-10-CM | POA: Diagnosis not present

## 2023-02-23 DIAGNOSIS — K5010 Crohn's disease of large intestine without complications: Secondary | ICD-10-CM | POA: Diagnosis not present

## 2023-02-23 LAB — CBC
HCT: 29.5 % — ABNORMAL LOW (ref 36.0–46.0)
Hemoglobin: 9.3 g/dL — ABNORMAL LOW (ref 12.0–15.0)
MCH: 27.4 pg (ref 26.0–34.0)
MCHC: 31.5 g/dL (ref 30.0–36.0)
MCV: 86.8 fL (ref 80.0–100.0)
Platelets: 1447 10*3/uL (ref 150–400)
RBC: 3.4 MIL/uL — ABNORMAL LOW (ref 3.87–5.11)
RDW: 16.4 % — ABNORMAL HIGH (ref 11.5–15.5)
WBC: 28 10*3/uL — ABNORMAL HIGH (ref 4.0–10.5)
nRBC: 0 % (ref 0.0–0.2)

## 2023-02-23 LAB — HEPARIN LEVEL (UNFRACTIONATED): Heparin Unfractionated: 0.67 [IU]/mL (ref 0.30–0.70)

## 2023-02-23 LAB — GLUCOSE, CAPILLARY
Glucose-Capillary: 135 mg/dL — ABNORMAL HIGH (ref 70–99)
Glucose-Capillary: 107 mg/dL — ABNORMAL HIGH (ref 70–99)
Glucose-Capillary: 116 mg/dL — ABNORMAL HIGH (ref 70–99)
Glucose-Capillary: 116 mg/dL — ABNORMAL HIGH (ref 70–99)
Glucose-Capillary: 108 mg/dL — ABNORMAL HIGH (ref 70–99)
Glucose-Capillary: 110 mg/dL — ABNORMAL HIGH (ref 70–99)

## 2023-02-23 MED ORDER — METOPROLOL TARTRATE 12.5 MG HALF TABLET
12.5000 mg | ORAL_TABLET | Freq: Two times a day (BID) | ORAL | Status: DC
  Administered 2023-02-23 – 2023-02-28 (×12): 12.5 mg via ORAL
  Filled 2023-02-23 (×12): qty 1

## 2023-02-23 MED ORDER — CAMPHOR-MENTHOL 0.5-0.5 % EX LOTN
TOPICAL_LOTION | CUTANEOUS | Status: DC | PRN
  Filled 2023-02-23: qty 222

## 2023-02-23 MED ORDER — ENOXAPARIN SODIUM 80 MG/0.8ML IJ SOSY
70.0000 mg | PREFILLED_SYRINGE | Freq: Two times a day (BID) | INTRAMUSCULAR | Status: DC
  Administered 2023-02-23 – 2023-03-06 (×24): 70 mg via SUBCUTANEOUS
  Filled 2023-02-23 (×24): qty 0.8

## 2023-02-23 MED ORDER — LORATADINE 10 MG PO TABS
10.0000 mg | ORAL_TABLET | Freq: Every day | ORAL | Status: DC
  Administered 2023-02-23 – 2023-03-19 (×25): 10 mg via ORAL
  Filled 2023-02-23 (×25): qty 1

## 2023-02-23 MED ORDER — HYDROXYZINE HCL 25 MG PO TABS: 50.0000 mg | ORAL_TABLET | Freq: Three times a day (TID) | ORAL | Status: DC | PRN

## 2023-02-23 NOTE — Progress Notes (Addendum)
PHARMACY - ANTICOAGULATION CONSULT NOTE  Pharmacy Consult for heparin Indication: pulmonary embolus (no boluses per surgery due to recent colectomy)  Allergies  Allergen Reactions   Codeine Shortness Of Breath, Swelling and Rash    Throat swelling   Hydrocodone Shortness Of Breath, Swelling and Rash   Ketorolac Nausea And Vomiting and Swelling    Pt reports n/v and swelling to throat   Morphine And Codeine Shortness Of Breath, Swelling and Rash   Penicillins Shortness Of Breath, Swelling and Other (See Comments)    Throat swells 02/06/21--TOLERATES CEFTRIAXONE     Nickel Rash   Pregabalin Other (See Comments)   Acetaminophen     Per MD patient states she can't take Tylenol because of liver enzymes   Ativan [Lorazepam] Other (See Comments)    Migraines.   Strawberry Extract Swelling   Watermelon Concentrate Swelling   Albuterol Palpitations   Benadryl [Diphenhydramine Hcl] Palpitations   Humira [Adalimumab] Rash   Latex Rash   Remicade [Infliximab] Rash    Blisters and Welts    Tramadol Rash    Rash and itching    Patient Measurements: Height: 5' 2.99" (160 cm) Weight: 70.7 kg (155 lb 13.8 oz) IBW/kg (Calculated) : 52.38 Heparin Dosing Weight: 68.8  Vital Signs: Temp: 98.1 F (36.7 C) (10/23 0335) Temp Source: Oral (10/23 0335) BP: 117/42 (10/23 0335) Pulse Rate: 110 (10/23 0335)  Labs: Recent Labs    02/21/23 0326 02/21/23 0800 02/22/23 0500 02/23/23 0515  HGB 9.7*  --  9.6* 9.3*  HCT 30.6*  --  30.0* 29.5*  PLT 1,389*  --  1,383* 1,447*  HEPARINUNFRC  --  0.47 0.66 0.67    Estimated Creatinine Clearance: 76.6 mL/min (A) (by C-G formula based on SCr of 0.41 mg/dL (L)).   Assessment: Patient with PMH of COPD, HTN, Crohn's, DM, GERD, asthma, anxiety, head injury, IBS, PTSD, PSVT, and seizure admitted on 9/26 after being found down at home with bloody emesis. She underwent CRP x 5 min with ROSC. She underwent a flexible sigmoidoscopy-she was found to have  changes consistent with pseudomembranous colitis. 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 On CT AP 10/16 there was noted splenic infarcts and pulm emboli. Pharmacy consulted on 10/16 to dose heparin without boluses due to recent colectomy.   Heparin level came back therapeutic at 0.67, on 2050 units/hr. Hgb 9.3, plt elevated at 1447. No s/sx of bleeding or infusion issues.   Goal of Therapy:  Heparin level 0.3-0.7 units/ml Monitor platelets by anticoagulation protocol: Yes   Plan:  Reduce heparin infusion slightly to 2000 units/hr since trending up on higher end of goal range  Daily heparin level, CBC, monitor for s/sx of bleeding  Thank you for allowing pharmacy to participate in this patient's care,  Sherron Monday, PharmD, BCCCP Clinical Pharmacist  Phone: [phone] 02/23/2023 7:37 AM  Please check AMION for all [facility] phone numbers After 10:00 PM, call Main Pharmacy [phone]  ADDENDUM  Discussed with hospitalist - given no plans for future procedures will change to enoxaparin for now with eventual plan for DOAC in future. Will stop heparin infusion and start enoxaparin 70 mg (1 mg/kg) every 12 hours. Monitor for s/sx of bleeding.   Thank you for allowing pharmacy to participate in this patient's care,  Sherron Monday, PharmD, BCCCP Clinical Pharmacist

## 2023-02-23 NOTE — Progress Notes (Signed)
Calorie Count Note  48 hour calorie count ordered.   Diet: regular  Supplements: Ensure BID, Magic Cup TID   Estimated Nutritional Needs:  Kcal:  1700-1900 kcal/d Protein:  85-100 g/d Fluid:  >1.8L/d  No documented meal completions/tickets received for 10/22. Per RD assessment yesterday, pt had not consumed breakfast or lunch. Uncertain whether pt consumed any dinner.   Checked in with RN this morning who reports that pt did not eat breakfast this morning and appears to lack motivation to self feed. Ensure provided but pt had only consumed 1-2 sips at this time. Reached out to MD with recommendation for replacement of Cortrak. MD prefers to hold on placement for now, continue to monitor and encourage intake at this time.   Nutrition Dx: Inadequate oral intake related to inability to eat as evidenced by NPO status.    Goal: Patient will meet greater than or equal to 90% of their needs   Intervention: Discontinue calorie count Continue to recommend placement of Cortrak for supplemental nutrition support given inadequacy of nutritional intake Ensure Enlive po BID, each supplement provides 350 kcal and 20 grams of protein. Discontinue magic cup Try Carnation Breakfast Essentials TID, each packet mixed with 8 ounces of 2% milk provides 13 grams of protein and 260 calories. MVI with minerals daily  Drusilla Kanner, RDN, LDN Clinical Nutrition

## 2023-02-23 NOTE — Progress Notes (Signed)
Physical Therapy Treatment Patient Details Name: [name] MRN: [id number] DOB: 04-12-1971 Today's Date: 02/23/2023   History of Present Illness 52 y.o. female presents to [facility] hospital on 01/27/2023 after being found unresponsive at home down in a pool of bloody vomit/stool. CPR was initiated upon arrival to ED, pt intubated and started on pressors. Pt undergoing management for inflammatory vs infections colitis. Pt extubated on 10/2. 10/10 underwent repeat proctosigmoidoscopy for ongoing diarrhea.  Diffuse severe inflammation identified due to combination of Crohn's disease, active C. difficile infection and possible ischemic colitis from prior hypotension on presentation. 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 10/13 became tachycardic and hypotensive.  More lethargic.  Transferred back to ICU. PMH includes COPD, Crohn's disease, HTN, DMII.    PT Comments  Pt received in supine and agreeable to session. Pt requires light min A to sit to EOB and increased time to adjust to upright position due to abdominal pain. Pt able to stand and pivot to recliner with up to min A due to weakness. Pt defers further mobility due to increased fatigue despite encouragement. Pt continues to be limited by weakness, pain, and impaired activity tolerance. Pt educated on importance of progressed mobility for strengthening and increased functional independence. Pt continues to benefit from PT services to progress toward functional mobility goals.    If plan is discharge home, recommend the following: A lot of help with bathing/dressing/bathroom;Two people to help with walking and/or transfers;Assistance with cooking/housework;Direct supervision/assist for medications management;Assist for transportation;Supervision due to cognitive status;Direct supervision/assist for financial management;Help with stairs or ramp for entrance   Can travel by private vehicle     No  Equipment Recommendations  Rolling  walker (2 wheels);BSC/3in1;Wheelchair (measurements PT);Wheelchair cushion (measurements PT)    Recommendations for Other Services       Precautions / Restrictions Precautions Precautions: Fall;Other (comment) Precaution Comments: watch HR/RR, bulb drain L, new colostomy, abdominal wound vac Restrictions Weight Bearing Restrictions: No     Mobility  Bed Mobility Overal bed mobility: Needs Assistance Bed Mobility: Rolling, Sidelying to Sit Rolling: Contact guard assist, Used rails Sidelying to sit: Used rails, Min assist, HOB elevated       General bed mobility comments: Light min A for trunk elevation    Transfers Overall transfer level: Needs assistance Equipment used: Rolling walker (2 wheels) Transfers: Sit to/from Stand, Bed to chair/wheelchair/BSC Sit to Stand: Min assist   Step pivot transfers: Contact guard assist       General transfer comment: from EOB with light min A for power up and cues for hand placement. Increased cues for direction and line management to transfer to chair with CGA for safety        Balance Overall balance assessment: Needs assistance Sitting-balance support: Feet supported, Bilateral upper extremity supported Sitting balance-Leahy Scale: Fair Sitting balance - Comments: sitting EOB   Standing balance support: Bilateral upper extremity supported, Reliant on assistive device for balance, During functional activity Standing balance-Leahy Scale: Poor Standing balance comment: with RW support                            Cognition Arousal: Alert Behavior During Therapy: Flat affect Overall Cognitive Status: Impaired/Different from baseline                                          Exercises  General Comments General comments (skin integrity, edema, etc.): HR up to 140s with mobility      Pertinent Vitals/Pain Pain Assessment Pain Assessment: Faces Faces Pain Scale: Hurts little more Pain  Location: abdomen Pain Descriptors / Indicators: Discomfort, Grimacing, Guarding Pain Intervention(s): Limited activity within patient's tolerance, Monitored during session, Repositioned     PT Goals (current goals can now be found in the care plan section) Acute Rehab PT Goals Patient Stated Goal: go home PT Goal Formulation: With patient Time For Goal Achievement: 02/17/23 Progress towards PT goals: Progressing toward goals    Frequency    Min 1X/week       AM-PAC PT "6 Clicks" Mobility   Outcome Measure  Help needed turning from your back to your side while in a flat bed without using bedrails?: A Little Help needed moving from lying on your back to sitting on the side of a flat bed without using bedrails?: A Little Help needed moving to and from a bed to a chair (including a wheelchair)?: A Little Help needed standing up from a chair using your arms (e.g., wheelchair or bedside chair)?: A Little Help needed to walk in hospital room?: A Lot Help needed climbing 3-5 steps with a railing? : Total 6 Click Score: 15    End of Session   Activity Tolerance: Patient limited by fatigue;Patient limited by pain Patient left: in chair;with call bell/phone within reach Nurse Communication: Mobility status PT Visit Diagnosis: Other abnormalities of gait and mobility (R26.89);Muscle weakness (generalized) (M62.81)     Time: 0347-4259 PT Time Calculation (min) (ACUTE ONLY): 31 min  Charges:    $Therapeutic Activity: 23-37 mins PT General Charges $$ ACUTE PT VISIT: 1 Visit                     [name], PTA Acute Rehabilitation Services Secure Chat Preferred  [phone]    [name] 02/23/2023, 12:03 PM

## 2023-02-23 NOTE — Plan of Care (Signed)
  Problem: Education: Goal: Knowledge of General Education information will improve Description: Including pain rating scale, medication(s)/side effects and non-pharmacologic comfort measures Outcome: Progressing   Problem: Nutrition: Goal: Adequate nutrition will be maintained Outcome: Progressing   Problem: Coping: Goal: Level of anxiety will decrease Outcome: Progressing   

## 2023-02-23 NOTE — Progress Notes (Signed)
Mobility Specialist Progress Note:   02/23/23 1354  Mobility  Activity Transferred from chair to bed  Level of Assistance Minimal assist, patient does 75% or more  Assistive Device Front wheel walker  Distance Ambulated (ft) 3 ft  Activity Response Tolerated well  Mobility Referral Yes  $Mobility charge 1 Mobility  Mobility Specialist Start Time (ACUTE ONLY) 1310  Mobility Specialist Stop Time (ACUTE ONLY) 1325  Mobility Specialist Time Calculation (min) (ACUTE ONLY) 15 min   Pre Mobility: 125 HR  During Mobility: 130 HR  Post Mobility: 123 HR  Pt received in chair, requesting assistance back to bed per RN request. MinA to stand. CG to pivot. Pt receptive to turning cues for line management. Asymptomatic throughout. Pt left in bed with call bell in reach and all needs met.   Leory Plowman  Mobility Specialist Please contact via [employer] office at [phone]

## 2023-02-23 NOTE — Progress Notes (Signed)
PROGRESS NOTE    [name]  QIH:[id number] DOB: 10/29/1970 DOA: 01/27/2023 PCP: [name], PA-C   Brief Narrative:  This is a 52 year old female with past medical history of COPD, Crohn's on Skyrizi, hypertension, type 2 diabetes mellitus who initially presented to the after being found unresponsive. Per family, patient has had bloody emesis with diarrhea over the past few days. Initial systolics in the 60s in the ED, and patient was minimally responsive. In the emergency room, patient was intubated, and patient was admitted to the ICU for further evaluation and management for shock and altered mental status.   01/27/2023: Admitted to ICU and intubated.  Felt to have septic shock of unclear source which resolved antibiotic therapy and fluid resuscitation.  Suspicion was that she may have had C. difficile colitis despite negative toxin.  She was treated with vancomycin. 9/29 Off pressors 10/2: Extubated 10/3 transfer to floor. 10/10 underwent repeat proctosigmoidoscopy for ongoing diarrhea.  Diffuse severe inflammation identified due to combination of Crohn's disease, active C. difficile infection and possible ischemic colitis from prior hypotension on presentation. 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  Intraoperative findings include frank necrosis with pelvic abscess formation and significant colitis. 10/12 some dilated small bowel with minimal OG output.  Continued observation advised.  Having significant postoperative pain. 10/13 became tachycardic and hypotensive.  More lethargic.  Transferred back to ICU. 10/15 blood cultures reports fungemia, resume PO vancomycin 10/17 CT abdomen pelvis notable for septic emboli - CTA chest on hold given IV access issues 10/18 TEE negative for endocarditis/bubble study - NG out for TEE probe.  Advance diet, hoping to avoid replacement of NG tube 10/19 wound VAC dysfunctional, surgery switching to wet-to-dry over the weekend  -pain poorly controlled today, add oxycodone (medication lists this is an allergy but patient verbally approved) 10/20 pain control improving, transition from IV fentanyl to Dilaudid, continue p.o. oxycodone, restart home anxiolytics with Xanax 10/21 Successful left subclavian CVC placement with IR 10/22 ophthalmology consulted in regards to dilated exam in the setting of candidemia    Assessment & Plan:   Septic shock, multifactorial, POA Fungemia, candida glabrata, likely POA Cdiff Colitis, POA Septic emboli, thrombocytosis -Cultures reporting fungemia, fulminant C. difficile colitis improving with concurrent intra-abdominal abscess  -ID following, appreciate insight recommendations - continue micafungin, PO vancomycin, ceftriaxone, Flagyl -Status post colectomy -CT abdomen pelvis shows splenic and pulmonary emboli -Left subclavian tunneled CVC placed 02/21/2023 with IR -DVT studies + for DVT of both upper extremities; BLE negative -TEE 10/18 negative for vegetations, bubble study negative **ID recommending neurology consult Monday the 21st for possible dilated funduscopic exam to rule out fungal endophthalmitis   Acute pulmonary emboli/upper extremity DVT CT scan from 10/16 showed evidence for pulmonary emboli. Lower extremity Doppler studies on 10/19 did not show any DVT in the lower extremities. Doppler studies of the upper extremities on 10/19 showed acute DVT involving the right IJ and right brachial.  Left brachial DVT was also noted. Patient currently on IV heparin. Suspect she can be transition to oral anticoagulant soon.  Does not appear that any new procedure is being planned at this time.  Could initially transition her to Lovenox.  Moderate to severe malnutrition Postoperative short bowel syndrome/high output ostomy, improving Malabsorption likely acute on chronic -Diet liberalized to increase calorie count, recommend family bring in food from outside facility if  necessary -NG tube remains out, calorie count ongoing per dietary, if caloric needs are not met will need to discuss replacement of  NG tube tube versus increased dietary supplementation -adding thiamine, folic acid in addition to above  Infectious colitis due to C. difficile on treatment, POA -See above -High output ostomy, continue to supplement p.o. and IV hydration as appropriate -P.o. vancomycin ongoing per ID  Intractable pain, multifactorial -Combination of postoperative pain, colitis, septic emboli as well as fungemia -Moderate improvement with initiation of p.o. oxycodone, allergy for oxycodone and Dilaudid deleted out of patient's chart per our discussion and tolerance of these medications -Continues with fentanyl patch, low-dose  Thrombocytosis  -Likely secondary to above infection - likely playing role in septic emboli This was discussed with hematology/oncology by previous providers.  Studies were ordered including BCR-ABL 1, CML/AL L PCR quantitative.  JAK2 was also ordered.  These are pending. Ferritin was 336, iron 11, TIBC 169.  ESR 123.  Longstanding Crohn's disease; not chronically on steroids so less concern for adrenal insufficiency -Hold steroids/Skyrizi in the setting of ongoing infection.   Type 2 diabetes -SSI PRN, has not needed much insulin currently due to poor p.o. intake -Goal BG 140-180   History of seizures -Continue PTA keppra/gabapentin   History of anxiety -Resume home Xanax, Cymbalta, hydroxyzine   History of hypertension -Hold home antihypertensives given infection/sepsis and prior hypotension. Resume as appropriate.   History of COPD -Continue xopenex PRN -Switch PTA spiriva to yupelri per forumulary   GERD -Continue PTA PPI   Anemia  -Transfuse for Hb <7 or hemodynamically significant bleeding Hemoglobin has been stable.   Hypokalemia/hypomagnesemia -Repleted, monitor  Cardiac arrest secondary to septic shock requiring CPR on  admission Echocardiogram on admit within normal limits  Pericardial effusion without tamponade Will need 6 to 8-week follow-up with Dr. Diona Browner in the outpatient setting for repeat echo   Seizure disorder Continue Keppra   Non-insulin-dependent diabetes type 2, well-controlled  -A1c 5.8 -advance diet as tolerated, continue hypoglycemic protocol and sliding scale insulin   DVT prophylaxis: SCDs Start: 01/27/23 1111 heparin gtt ongoing Code Status:   Code Status: Full Code Family Communication: Daughter updated over the phone  Status is: Inpatient  Dispo: The patient is from: Home              Anticipated d/c is to: TBD              Anticipated d/c date is: To be determined              Patient currently not medically stable for discharge  Consultants:  PCCM, ID, general surgery  Procedures:  Colectomy with ostomy placement  Antimicrobials:  Ceftriaxone, Flagyl, micafungin, PO vancomycin  Subjective: Complains of itching which she attributes to hydrocodone.  Nursing staff feels that could be due to the CHG wipes.  Monitor for now.  No rashes noted.  Objective: Vitals:   02/22/23 1951 02/23/23 0335 02/23/23 0438 02/23/23 0739  BP: 124/70 (!) 117/42  117/69  Pulse: (!) 117 (!) 110  (!) 121  Resp: 13 15  18   Temp: 98 F (36.7 C) 98.1 F (36.7 C)  97.6 F (36.4 C)  TempSrc: Oral Oral  Oral  SpO2: 91% 91%  95%  Weight:   70.7 kg   Height:        Intake/Output Summary (Last 24 hours) at 02/23/2023 1024 Last data filed at 02/23/2023 1000 Gross per 24 hour  Intake 1247.91 ml  Output 2075 ml  Net -827.09 ml   Filed Weights   02/20/23 0520 02/21/23 0426 02/23/23 0438  Weight: 65 kg 64.8 kg  70.7 kg    Examination:  General appearance: Awake alert.  In no distress Resp: Clear to auscultation bilaterally.  Normal effort Cardio: S1-S2 is normal regular.  No S3-S4.  No rubs murmurs or bruit GI: Abdomen is soft.  Colostomy is noted.  Wound VAC is  noted. Extremities: No edema.  Full range of motion of lower extremities.   Data Reviewed: I have personally reviewed following labs and reports of imaging studies  CBC: Recent Labs  Lab 02/19/23 0359 02/20/23 0341 02/21/23 0326 02/22/23 0500 02/23/23 0515  WBC 32.6* 35.2* 32.4* 27.3* 28.0*  NEUTROABS  --   --   --  20.3*  --   HGB 9.6* 10.0* 9.7* 9.6* 9.3*  HCT 30.3* 31.1* 30.6* 30.0* 29.5*  MCV 85.6 86.1 86.0 86.5 86.8  PLT 1,103* 1,097* 1,389* 1,383* 1,447*   Basic Metabolic Panel: Recent Labs  Lab 02/17/23 0321 02/18/23 0539 02/19/23 0359 02/20/23 0341  NA 136 134* 134* 131*  K 3.3* 4.4 3.7 4.3  CL 97* 99 95* 96*  CO2 24 22 24  19*  GLUCOSE 134* 111* 105* 99  BUN 5* <5* <5* <5*  CREATININE 0.37* 0.45 0.51 0.41*  CALCIUM 9.0 9.1 9.6 8.8*  MG 1.6* 1.9  --   --    GFR: Estimated Creatinine Clearance: 76.6 mL/min (A) (by C-G formula based on SCr of 0.41 mg/dL (L)).  Liver Function Tests: Recent Labs  Lab 02/17/23 0321 02/18/23 0539 02/19/23 0359 02/20/23 0341  AST 14* 14* 15 17  ALT 9 7 10 7   ALKPHOS 132* 139* 161* 242*  BILITOT 0.4 0.5 0.5 0.6  PROT 5.3* 5.6* 5.9* 6.0*  ALBUMIN 2.6* 2.5* 2.7* 2.7*   CBG: Recent Labs  Lab 02/22/23 1725 02/22/23 1949 02/22/23 2330 02/23/23 0436 02/23/23 0738  GLUCAP 123* 119* 123* 135* 107*    Recent Results (from the past 240 hour(s))  Culture, blood (Routine X 2) w Reflex to ID Panel     Status: None   Collection Time: 02/17/23 12:42 PM   Specimen: BLOOD LEFT HAND  Result Value Ref Range Status   Specimen Description BLOOD LEFT HAND  Final   Special Requests   Final    BOTTLES DRAWN AEROBIC ONLY Blood Culture adequate volume   Culture   Final    NO GROWTH 5 DAYS Performed at [facility], [address] [place], [place] [address]    Report Status 02/22/2023 FINAL  Final  Culture, blood (Routine X 2) w Reflex to ID Panel     Status: None   Collection Time: 02/17/23 12:42 PM   Specimen: BLOOD LEFT  HAND  Result Value Ref Range Status   Specimen Description BLOOD LEFT HAND  Final   Special Requests   Final    BOTTLES DRAWN AEROBIC ONLY Blood Culture adequate volume   Culture   Final    NO GROWTH 5 DAYS Performed at [facility], [address], [place], [place] [address]    Report Status 02/22/2023 FINAL  Final    Radiology Studies: IR Fluoro Guide CV Line Left  Result Date: 02/21/2023 INDICATION: Possible central line placement w/ bilateral UE DVT. Poor IV access. EXAM: ULTRASOUND AND FLUOROSCOPIC GUIDED PLACEMENT OF TUNNELED CENTRAL VENOUS CATHETER MEDICATIONS: None. ANESTHESIA/SEDATION: Local anesthetic was administered. FLUOROSCOPY TIME:  Fluoroscopic dose; 1 mGy COMPLICATIONS: None immediate. PROCEDURE: Informed written consent was obtained from the patient and/or patient's representative after a discussion of the risks, benefits, and alternatives to treatment. Questions regarding  the procedure were encouraged and answered. A few attempts at RIGHT upper extremity peripherally inserted central catheter (PICC) placement were performed, with inability to thread the microwire into the axillary and subclavian vein. Tunneled central venous catheter placement was then recommended. The LEFT neck and chest were prepped with chlorhexidine in a sterile fashion, and a sterile drape was applied covering the operative field. Maximum barrier sterile technique with sterile gowns and gloves were used for the procedure. A timeout was performed prior to the initiation of the procedure. After the overlying soft tissues were anesthetized, a small venotomy incision was created and a micropuncture kit was utilized to access the internal jugular vein. Real-time ultrasound guidance was utilized for vascular access including the acquisition of a permanent ultrasound image documenting patency of the accessed vessel. The microwire was utilized to measure appropriate catheter length. The micropuncture sheath was  exchanged for a peel-away sheath over a guidewire. A 5 Fr dual lumen tunneled central venous catheter measuring 23 cm was tunneled in a retrograde fashion from the anterior chest wall to the venotomy incision. The catheter was then placed through the peel-away sheath with tip ultimately positioned at the superior caval-atrial junction. Final catheter positioning was confirmed and documented with a spot radiographic image. The catheter aspirates and flushes normally. The catheter was flushed with appropriate volume heparin dwells. The catheter exit site was secured with a 2-0 Ethilon retention suture. The venotomy incision was closed with Dermabond. Dressings were applied. The patient tolerated the procedure well without immediate post procedural complication. FINDINGS: *Aborted attempts at RIGHT upper extremity PICC placement. *After tunneled central venous catheter placement, the tip lies within the superior cavoatrial junction. The catheter aspirates and flushes normally and is ready for immediate use. IMPRESSION: Successful placement of 23 cm dual lumen tunneled central venous catheter via the LEFT internal jugular vein The tip of the catheter is positioned at the superior cavo-atrial junction. The catheter is ready for immediate use. Roanna Banning, MD [facility] Radiology Electronically Signed   By: Roanna Banning M.D.   On: 02/21/2023 14:52   IR US Guide Vasc Access Left  Result Date: 02/21/2023 INDICATION: Possible central line placement w/ bilateral UE DVT. Poor IV access. EXAM: ULTRASOUND AND FLUOROSCOPIC GUIDED PLACEMENT OF TUNNELED CENTRAL VENOUS CATHETER MEDICATIONS: None. ANESTHESIA/SEDATION: Local anesthetic was administered. FLUOROSCOPY TIME:  Fluoroscopic dose; 1 mGy COMPLICATIONS: None immediate. PROCEDURE: Informed written consent was obtained from the patient and/or patient's representative after a discussion of the risks, benefits, and  alternatives to treatment. Questions regarding the procedure were encouraged and answered. A few attempts at RIGHT upper extremity peripherally inserted central catheter (PICC) placement were performed, with inability to thread the microwire into the axillary and subclavian vein. Tunneled central venous catheter placement was then recommended. The LEFT neck and chest were prepped with chlorhexidine in a sterile fashion, and a sterile drape was applied covering the operative field. Maximum barrier sterile technique with sterile gowns and gloves were used for the procedure. A timeout was performed prior to the initiation of the procedure. After the overlying soft tissues were anesthetized, a small venotomy incision was created and a micropuncture kit was utilized to access the internal jugular vein. Real-time ultrasound guidance was utilized for vascular access including the acquisition of a permanent ultrasound image documenting patency of the accessed vessel. The microwire was utilized to measure appropriate catheter length. The micropuncture sheath was exchanged for a peel-away sheath over a guidewire. A 5 Fr dual lumen tunneled  central venous catheter measuring 23 cm was tunneled in a retrograde fashion from the anterior chest wall to the venotomy incision. The catheter was then placed through the peel-away sheath with tip ultimately positioned at the superior caval-atrial junction. Final catheter positioning was confirmed and documented with a spot radiographic image. The catheter aspirates and flushes normally. The catheter was flushed with appropriate volume heparin dwells. The catheter exit site was secured with a 2-0 Ethilon retention suture. The venotomy incision was closed with Dermabond. Dressings were applied. The patient tolerated the procedure well without immediate post procedural complication. FINDINGS: *Aborted attempts at RIGHT upper extremity PICC placement. *After tunneled central venous catheter  placement, the tip lies within the superior cavoatrial junction. The catheter aspirates and flushes normally and is ready for immediate use. IMPRESSION: Successful placement of 23 cm dual lumen tunneled central venous catheter via the LEFT internal jugular vein The tip of the catheter is positioned at the superior cavo-atrial junction. The catheter is ready for immediate use. Roanna Banning, MD [facility] Radiology Electronically Signed   By: Roanna Banning M.D.   On: 02/21/2023 14:52        Scheduled Meds:  acetaminophen  650 mg Oral Q6H   amitriptyline  25 mg Oral QHS   Chlorhexidine Gluconate Cloth  6 each Topical Daily   DULoxetine  30 mg Oral Daily   feeding supplement  237 mL Oral TID BM   fentaNYL  1 patch Transdermal Q72H   ferrous sulfate  325 mg Oral BID WC   folic acid  1 mg Intravenous Daily   gabapentin  100 mg Oral Q8H   Gerhardt's butt cream   Topical BID   insulin aspart  0-9 Units Subcutaneous Q4H   lidocaine  2 patch Transdermal Q24H   lidocaine  20 mL Infiltration Once   loperamide  4 mg Oral QID   loratadine  10 mg Oral Daily   methocarbamol  1,000 mg Oral TID   multivitamin with minerals  1 tablet Oral Daily   pantoprazole (PROTONIX) IV  40 mg Intravenous Q24H   revefenacin  175 mcg Nebulization Daily   thiamine (VITAMIN B1) injection  100 mg Intravenous Daily   vancomycin  125 mg Oral QID   Continuous Infusions:  cefTRIAXone (ROCEPHIN)  IV 2 g (02/22/23 2219)   heparin 2,000 Units/hr (02/23/23 0814)   levETIRAcetam 750 mg (02/23/23 0950)   metronidazole 500 mg (02/23/23 0634)   micafungin (MYCAMINE) 150 mg in sodium chloride 0.9 % 100 mL IVPB 150 mg (02/23/23 0521)     LOS: 27 days   Osvaldo Shipper,  [facility]  If 7PM-7AM, please contact night-coverage [email or web address]  02/23/2023, 10:24 AM

## 2023-02-23 NOTE — Consult Note (Signed)
Ophthalmology Initial Consult Note  Christine, [name], 52 y.o. female Date of Service:  02/23/23  Requesting physician: Osvaldo Shipper, MD  Information Obtained from: Patient Chief Complaint:  Candidemia  HPI/Discussion:  [name] is a 52 y.o. female who is admitted with candidemia. Primary team requesting DFE to look for endophthalmitis. The patient denies having any eye problems or any current ophthalmic symptoms.  Past Ocular Hx: None Ocular Meds:  None Family ocular history: None pertinent  Past Medical History:  Diagnosis Date   Anxiety    Asthma    Chronic low back pain    Collagen vascular disease (HCC)    COPD (chronic obstructive pulmonary disease) (HCC)    Crohn's disease (HCC)    GERD (gastroesophageal reflux disease)    EGD 01/2007 by Dr.Rourke small hiatal hernia s/p 56 french maloney    History of head injury    Hypertension    IBS (irritable bowel syndrome)    PSVT (paroxysmal supraventricular tachycardia) (HCC)    PTSD (post-traumatic stress disorder)    Recurrent chest pain    Seizure disorder (HCC)    Type 2 diabetes mellitus (HCC)    Past Surgical History:  Procedure Laterality Date   ABDOMINAL HYSTERECTOMY     with right salpingo oophorectomy 2005   APPENDECTOMY  2006   BALLOON DILATION N/A 02/19/2021   Procedure: BALLOON DILATION;  Surgeon: Lanelle Bal, DO;  [facility];  Service: Endoscopy;  Laterality: N/A;  Sigmoid colon stricture   BIOPSY  02/06/2021   Procedure: BIOPSY;  Surgeon: Lanelle Bal, DO;  [facility]: [facility];  Service: Endoscopy;;   BIOPSY  02/19/2021   Procedure: BIOPSY;  Surgeon: Lanelle Bal, DO;  [facility];  Service: Endoscopy;;   BIOPSY  07/15/2021   Procedure: BIOPSY;  Surgeon: Malissa Hippo, MD;  [facility];  Service: Endoscopy;;   CESAREAN SECTION     1996   CHOLECYSTECTOMY     COLECTOMY N/A 02/11/2023   Procedure: TOTAL COLECTOMY;  Surgeon: Harriette Bouillon,  MD;  Location: [facility];  Service: General;  Laterality: N/A;   COLONOSCOPY  2010   Dr. Jena Gauss; negative except for hemorrhoids   COLOSTOMY  02/11/2023   Procedure: COLOSTOMY;  Surgeon: Harriette Bouillon, MD;  Location: [facility];  Service: General;;   ESOPHAGEAL DILATION N/A 10/25/2014   Procedure: ESOPHAGEAL DILATION;  Surgeon: Malissa Hippo, MD;  [facility];  Service: Endoscopy;  Laterality: N/A;   ESOPHAGOGASTRODUODENOSCOPY N/A 10/25/2014   Procedure: ESOPHAGOGASTRODUODENOSCOPY (EGD);  Surgeon: Malissa Hippo, MD;  [facility];  Service: Endoscopy;  Laterality: N/A;  1250   FLEXIBLE SIGMOIDOSCOPY N/A 02/06/2021   Procedure: FLEXIBLE SIGMOIDOSCOPY;  Surgeon: Lanelle Bal, DO;  [facility];  Service: Endoscopy;  Laterality: N/A;   FLEXIBLE SIGMOIDOSCOPY N/A 02/19/2021   Procedure: FLEXIBLE SIGMOIDOSCOPY;  Surgeon: Lanelle Bal, DO;  [facility];  Service: Endoscopy;  Laterality: N/A;   FLEXIBLE SIGMOIDOSCOPY N/A 02/02/2023   Procedure: FLEXIBLE SIGMOIDOSCOPY;  Surgeon: Beverley Fiedler, MD;  Location: [facility];  Service: Gastroenterology;  Laterality: N/A;   FLEXIBLE SIGMOIDOSCOPY N/A 02/10/2023   Procedure: FLEXIBLE SIGMOIDOSCOPY;  Surgeon: Sherrilyn Rist, MD;  Location: [facility] ENDOSCOPY;  Service: Gastroenterology;  Laterality: N/A;   IR FLUORO GUIDE CV LINE LEFT  02/21/2023   IR US GUIDE VASC ACCESS LEFT  02/21/2023   LAPAROTOMY N/A 02/11/2023   Procedure: EXPLORATORY LAPAROTOMY;  Surgeon: Luisa Hart,  Maisie Fus, MD;  Location: [facility];  Service: General;  Laterality: N/A;   OOPHORECTOMY     left for torsion and ovarian fibroma; uterine myoma resected 1995   SIGMOIDOSCOPY  07/15/2021   Procedure: SIGMOIDOSCOPY;  Surgeon: Malissa Hippo, MD;  [facility];  Service: Endoscopy;;   TRANSESOPHAGEAL ECHOCARDIOGRAM (CATH LAB) N/A 02/18/2023   Procedure: TRANSESOPHAGEAL ECHOCARDIOGRAM;  Surgeon: Sande Rives, MD;  Location: [facility]  INVASIVE CV LAB;  Service: Cardiovascular;  Laterality: N/A;    Prior to Admission Meds: Medications Prior to Admission  Medication Sig Dispense Refill Last Dose   acetaminophen (TYLENOL) 325 MG tablet Take 2 tablets (650 mg total) by mouth every 6 (six) hours as needed for mild pain or headache (or Fever >/= 101).      acetaminophen (TYLENOL) 500 MG tablet Take by mouth.      ALPRAZolam (XANAX) 1 MG tablet Take 1 mg by mouth 3 (three) times daily as needed for anxiety.      amitriptyline (ELAVIL) 100 MG tablet Take 100 mg by mouth at bedtime.      budesonide (ENTOCORT EC) 3 MG 24 hr capsule Take 9 mg by mouth daily.      cetirizine (ZYRTEC) 10 MG tablet Take 10 mg by mouth daily.      clotrimazole-betamethasone (LOTRISONE) cream SMARTSIG:Sparingly Topical Twice Daily PRN      cyclobenzaprine (FLEXERIL) 10 MG tablet Take 10 mg by mouth 3 (three) times daily as needed.      DULoxetine (CYMBALTA) 30 MG capsule Take 30 mg by mouth daily.      feeding supplement (ENSURE ENLIVE / ENSURE PLUS) LIQD Take 237 mLs by mouth 2 (two) times daily between meals.      folic acid (FOLVITE) 1 MG tablet Take 1 tablet (1 mg total) by mouth daily.      gabapentin (NEURONTIN) 300 MG capsule TAKE (1) CAPSULE BY MOUTH TWICE DAILY.      hydrOXYzine (ATARAX) 25 MG tablet Take 25 mg by mouth 3 (three) times daily as needed for anxiety.      hydrOXYzine (ATARAX) 50 MG tablet Take 25-50 mg by mouth 3 (three) times daily as needed.      levETIRAcetam (KEPPRA) 750 MG tablet Take 750 mg by mouth 2 (two) times daily.      metoprolol succinate (TOPROL XL) 25 MG 24 hr tablet Take 1 tablet (25 mg total) by mouth daily. 90 tablet 3    metoprolol tartrate (LOPRESSOR) 25 MG tablet Take 25 mg by mouth 2 (two) times daily.      Multiple Vitamins-Minerals (SUPER THERA VITE M) TABS Take 1 tablet by mouth daily.      nitroGLYCERIN (NITROSTAT) 0.4 MG SL tablet Place 1 tablet (0.4 mg total) under the tongue every 5 (five) minutes as  needed for chest pain. 100 tablet 0    omeprazole (PRILOSEC) 40 MG capsule Take 40 mg by mouth 2 (two) times daily.      ondansetron (ZOFRAN-ODT) 4 MG disintegrating tablet Take 4 mg by mouth every 8 (eight) hours as needed for nausea or vomiting.      pantoprazole (PROTONIX) 40 MG tablet Take 1 tablet (40 mg total) by mouth 2 (two) times daily. 60 tablet 2    polyethylene glycol powder (GLYCOLAX/MIRALAX) 17 GM/SCOOP powder Take by mouth.      RESTASIS 0.05 % ophthalmic emulsion Place 1 drop into both eyes 2 (two) times daily.      Risankizumab-rzaa (SKYRIZI) 360 MG/2.4ML SOCT  Inject 2.4 mLs into the skin See admin instructions. Every 8 weeks      SPIRIVA RESPIMAT 2.5 MCG/ACT AERS Inhale 1 puff into the lungs daily. 4 g 3    triazolam (HALCION) 0.25 MG tablet Take 0.25 mg by mouth daily.      Vitamin D, Ergocalciferol, (DRISDOL) 1.25 MG (50000 UNIT) CAPS capsule Take 50,000 Units by mouth once a week. Takes on Wednesdays.       Inpatient Meds: @IPMEDS @  Allergies  Allergen Reactions   Codeine Shortness Of Breath, Swelling and Rash    Throat swelling   Hydrocodone Shortness Of Breath, Swelling and Rash   Ketorolac Nausea And Vomiting and Swelling    Pt reports n/v and swelling to throat   Morphine And Codeine Shortness Of Breath, Swelling and Rash   Penicillins Shortness Of Breath, Swelling and Other (See Comments)    Throat swells 02/06/21--TOLERATES CEFTRIAXONE     Nickel Rash   Pregabalin Other (See Comments)   Acetaminophen     Per MD patient states she can't take Tylenol because of liver enzymes   Atarax [Hydroxyzine] Itching and Swelling    Mildly swollen throat   Ativan [Lorazepam] Other (See Comments)    Migraines.   Strawberry Extract Swelling   Watermelon Concentrate Swelling   Albuterol Palpitations   Benadryl [Diphenhydramine Hcl] Palpitations   Humira [Adalimumab] Rash   Latex Rash   Remicade [Infliximab] Rash    Blisters and Welts    Tramadol Rash    Rash and  itching   Social History   Tobacco Use   Smoking status: Some Days    Current packs/day: 0.50    Average packs/day: 0.5 packs/day for 38.4 years (19.2 ttl pk-yrs)    Types: Cigarettes    Start date: 09/26/1984    Passive exposure: Current   Smokeless tobacco: Never  Substance Use Topics   Alcohol use: No    Alcohol/week: 0.0 standard drinks of alcohol   Family History  Problem Relation Age of Onset   Cancer Mother    Heart failure Mother    Hypertension Mother    Heart attack Mother    Heart attack Father 67   Cancer Father 47   Heart failure Father    Diabetes Father    Crohn's disease Cousin     ROS: Other than ROS in the HPI, all other systems were negative.  Exam: Temp: 98.2 F (36.8 C) Pulse Rate: (!) 108 BP: (!) 116/59 Resp: 14 SpO2: 94 %  Visual Acuity:  [place]  cc  ph  near   OD  +     20/60   OS  +     20/60     OD OS  Confr Vis Fields FTCF FTCF  EOM (Primary) Full and ortho Full and ortho  Lids/Lashes Normal Normal  Conjunctiva - Bulbar White White  Conjunctiva - Palpebral               Normal Normal  Adnexa  Normal Normal  Pupils  ERRL ERRL  Reaction, Direct Brisk Brisk                 Consensual Brisk Brisk                 RAPD No No  Cornea  Clear Clear  Anterior Chamber Deep Deep  Lens:  Trace NS Trace NS  IOP 15 15  Fundus - Dilated? Yes   Optic Disc - C:D Ratio 0.25 0.25  Appearance  Normal Normal                     NF Layer    Post Seg:  Retina                    Vessels Normal Normal                  Vitreous  Clear Clear                  Macula Flat Flat                  Periphery Attached Attached       Neuro:  Oriented to person, place, and time:  Yes Psychiatric:  Mood and Affect Appropriate:  Yes  Labs/imaging:   A/P:  52 y.o. female with candidema - Consult to r/o endogenous endophthalmitis - Normal DFE - No fungal infiltrates  Marchelle Gearing, MD 02/23/2023, 7:29 PM

## 2023-02-23 NOTE — TOC Benefit Eligibility Note (Signed)
Patient Product/process development scientist completed.    The patient is insured through [facility].     Ran test claim for Eliquis 5 mg and the current 30 day co-pay is $4.00.  Ran test claim for Xarelto 20 mg and the current 30 day co-pay is $4.00.  This test claim was processed through [facility]- copay amounts may vary at other pharmacies due to pharmacy/plan contracts, or as the patient moves through the different stages of their insurance plan.     Roland Earl, CPHT Pharmacy Technician III Certified Patient Advocate [facility] Patient Advocate Team Direct Number: [phone]  Fax: [phone]

## 2023-02-24 DIAGNOSIS — B49 Unspecified mycosis: Secondary | ICD-10-CM | POA: Diagnosis not present

## 2023-02-24 DIAGNOSIS — I2699 Other pulmonary embolism without acute cor pulmonale: Secondary | ICD-10-CM | POA: Diagnosis not present

## 2023-02-24 DIAGNOSIS — K5010 Crohn's disease of large intestine without complications: Secondary | ICD-10-CM | POA: Diagnosis not present

## 2023-02-24 DIAGNOSIS — A0472 Enterocolitis due to Clostridium difficile, not specified as recurrent: Secondary | ICD-10-CM | POA: Diagnosis not present

## 2023-02-24 LAB — COMPREHENSIVE METABOLIC PANEL
ALT: 7 U/L (ref 0–44)
AST: 13 U/L — ABNORMAL LOW (ref 15–41)
Albumin: 2.4 g/dL — ABNORMAL LOW (ref 3.5–5.0)
Alkaline Phosphatase: 191 U/L — ABNORMAL HIGH (ref 38–126)
Anion gap: 9 (ref 5–15)
BUN: 6 mg/dL (ref 6–20)
CO2: 28 mmol/L (ref 22–32)
Calcium: 8.9 mg/dL (ref 8.9–10.3)
Chloride: 94 mmol/L — ABNORMAL LOW (ref 98–111)
Creatinine, Ser: 0.45 mg/dL (ref 0.44–1.00)
GFR, Estimated: >60 mL/min (ref >60)
Glucose, Bld: 104 mg/dL — ABNORMAL HIGH (ref 70–99)
Potassium: 4 mmol/L (ref 3.5–5.1)
Sodium: 131 mmol/L — ABNORMAL LOW (ref 135–145)
Total Bilirubin: 0.3 mg/dL (ref 0.3–1.2)
Total Protein: 6.2 g/dL — ABNORMAL LOW (ref 6.5–8.1)

## 2023-02-24 LAB — CBC
HCT: 29.1 % — ABNORMAL LOW (ref 36.0–46.0)
Hemoglobin: 9.4 g/dL — ABNORMAL LOW (ref 12.0–15.0)
MCH: 28.1 pg (ref 26.0–34.0)
MCHC: 32.3 g/dL (ref 30.0–36.0)
MCV: 86.9 fL (ref 80.0–100.0)
Platelets: 1272 10*3/uL (ref 150–400)
RBC: 3.35 MIL/uL — ABNORMAL LOW (ref 3.87–5.11)
RDW: 16.3 % — ABNORMAL HIGH (ref 11.5–15.5)
WBC: 23.7 10*3/uL — ABNORMAL HIGH (ref 4.0–10.5)
nRBC: 0.1 % (ref 0.0–0.2)

## 2023-02-24 LAB — GLUCOSE, CAPILLARY
Glucose-Capillary: 104 mg/dL — ABNORMAL HIGH (ref 70–99)
Glucose-Capillary: 105 mg/dL — ABNORMAL HIGH (ref 70–99)
Glucose-Capillary: 108 mg/dL — ABNORMAL HIGH (ref 70–99)
Glucose-Capillary: 124 mg/dL — ABNORMAL HIGH (ref 70–99)
Glucose-Capillary: 125 mg/dL — ABNORMAL HIGH (ref 70–99)
Glucose-Capillary: 87 mg/dL (ref 70–99)

## 2023-02-24 MED ORDER — SODIUM CHLORIDE 0.9 % IV SOLN: INTRAVENOUS | Status: AC

## 2023-02-24 NOTE — Progress Notes (Deleted)
Patient was complaining of fluttering feeling in her heart,

## 2023-02-24 NOTE — Progress Notes (Signed)
Breathing treatment attempted x 2, RN is working with patient and states it isn't a good time for treatment at this time.

## 2023-02-24 NOTE — Plan of Care (Signed)
  Problem: Clinical Measurements: Goal: Diagnostic test results will improve Outcome: Progressing   Problem: Clinical Measurements: Goal: Respiratory complications will improve Outcome: Progressing   Problem: Clinical Measurements: Goal: Will remain free from infection Outcome: Progressing   Problem: Education: Goal: Knowledge of General Education information will improve Description: Including pain rating scale, medication(s)/side effects and non-pharmacologic comfort measures Outcome: Progressing   Problem: Clinical Measurements: Goal: Cardiovascular complication will be avoided Outcome: Progressing

## 2023-02-24 NOTE — Consult Note (Signed)
WOC Nurse wound follow up Wound type: full thickness surgical midline  Measurement: 21 cm x 2.5 cm x 2 cm  Wound [id number] red, small amount of tan fibrinous material upper aspect  Drainage (amount, consistency, odor) moderate sanguinous  Periwound: intact, ostomy RLQ; JP LLQ  Dressing procedure/placement/frequency: Removed old NPWT dressing Cleansed wound with normal saline Flattened out half of a barrier ring and placed at umbilicus;  Filled wound with 1  piece of  black foam Sealed NPWT dressing at HG Patient received PO pain medication per bedside nurse prior to dressing change Patient tolerated procedure well   WOC nurse will continue to provide NPWT dressing changes due to the complexity of the dressing change Mon/Thurs; next due Monday 02/28/2023   1 medium black foam dressing kit in room   WOC Nurse ostomy follow up Stoma type/location: RLQ ileostomy  Stomal assessment/size: 1 1/2" red moist, slightly above skin level, some mucosal separation noted from 9 to 11 o'clock  Peristomal assessment: intact, midline incision to left of ostomy as above  Treatment options for stomal/peristomal skin: I filled in mucosal separation with silver hydrofiber and placed 2" barrier ring over top.  Output 350+ mls liquid brown stool  Ostomy pouching: 1pc convex; attempted to place 2 3/4" convex and high output pouch but leaked immediately and was switched back to 1 piece convex [place] #[id number].  Education provided: discussed with patient not letting bag get over 1/2 way full. Patient is not engaged at all to learn, expresses no interest and asks no questions.  I have discussed with her that she will need to learn to care for ostomy before she is discharged. Again, patient expresses no interest in this.   Ordered (4) [id number] 1 piece convex pouches for room.    Enrolled patient in [place] Secure Start Discharge program: Yes   WOC team will continue to follow for ostomy education and  support.   Thank you,    Priscella Mann MSN, RN-BC, [employer] [phone]

## 2023-02-24 NOTE — Progress Notes (Addendum)
   02/24/23 1726  Vitals  BP (!) 120/56  MAP (mmHg) 74  BP Location Left Arm  BP Method Automatic  Patient Position (if appropriate) Lying  Pulse Rate (!) 119  Pulse Rate Source Monitor  ECG Heart Rate (!) 119  Resp 18  MEWS COLOR  MEWS Score Color Yellow  Glasgow Coma Scale  Eye Opening 4  Best Verbal Response (NON-intubated) 5  Best Motor Response 6  Glasgow Coma Scale Score 15  MEWS Score  MEWS Temp 0  MEWS Systolic 0  MEWS Pulse 2  MEWS RR 0  MEWS LOC 0  MEWS Score 2   At 1725, Patient complaint of fluttering feeling in her heart, she was conscious and oriented, headache and dizziness was not present, patient requested for the medication so, Metoprolol 5mg  given as per PRN order, after 20 mins she mentioned "feeling better". Call bell in reach, on continue monitoring.

## 2023-02-24 NOTE — Plan of Care (Signed)

## 2023-02-24 NOTE — Plan of Care (Signed)
Poc progressing.  

## 2023-02-24 NOTE — Progress Notes (Signed)
PROGRESS NOTE    [name]  ZOX:[id number] DOB: 06-02-1970 DOA: 01/27/2023 PCP: [name], PA-C   Brief Narrative:  This is a 52 year old female with past medical history of COPD, Crohn's on Skyrizi, hypertension, type 2 diabetes mellitus who initially presented to the after being found unresponsive. Per family, patient has had bloody emesis with diarrhea over the past few days. Initial systolics in the 60s in the ED, and patient was minimally responsive. In the emergency room, patient was intubated, and patient was admitted to the ICU for further evaluation and management for shock and altered mental status.   01/27/2023: Admitted to ICU and intubated.  Felt to have septic shock of unclear source which resolved antibiotic therapy and fluid resuscitation.  Suspicion was that she may have had C. difficile colitis despite negative toxin.  She was treated with vancomycin. 9/29 Off pressors 10/2: Extubated 10/3 transfer to floor. 10/10 underwent repeat proctosigmoidoscopy for ongoing diarrhea.  Diffuse severe inflammation identified due to combination of Crohn's disease, active C. difficile infection and possible ischemic colitis from prior hypotension on presentation. 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  Intraoperative findings include frank necrosis with pelvic abscess formation and significant colitis. 10/12 some dilated small bowel with minimal OG output.  Continued observation advised.  Having significant postoperative pain. 10/13 became tachycardic and hypotensive.  More lethargic.  Transferred back to ICU. 10/15 blood cultures reports fungemia, resume PO vancomycin 10/17 CT abdomen pelvis notable for septic emboli - CTA chest on hold given IV access issues 10/18 TEE negative for endocarditis/bubble study - NG out for TEE probe.  Advance diet, hoping to avoid replacement of NG tube 10/19 wound VAC dysfunctional, surgery switching to wet-to-dry over the weekend  -pain poorly controlled today, add oxycodone (medication lists this is an allergy but patient verbally approved) 10/20 pain control improving, transition from IV fentanyl to Dilaudid, continue p.o. oxycodone, restart home anxiolytics with Xanax 10/21 Successful left subclavian CVC placement with IR 10/22 ophthalmology consulted in regards to dilated exam in the setting of candidemia    Assessment & Plan:   Septic shock, multifactorial, POA Fungemia, candida glabrata, likely POA Cdiff Colitis, POA Septic emboli, thrombocytosis -Cultures reporting fungemia, fulminant C. difficile colitis improving with concurrent intra-abdominal abscess  -ID following, appreciate insight recommendations - continue micafungin, PO vancomycin, ceftriaxone, Flagyl -Status post colectomy -Left subclavian tunneled CVC placed 02/21/2023 with IR -TEE 10/18 negative for vegetations, bubble study negative Patient seen by ophthalmology on 10/23.  No evidence for fungal infiltrates on funduscopic evaluation.   Acute pulmonary emboli/upper extremity DVT CT scan from 10/16 showed evidence for pulmonary emboli. Lower extremity Doppler studies on 10/19 did not show any DVT in the lower extremities. Doppler studies of the upper extremities on 10/19 showed acute DVT involving the right IJ and right brachial.  Left brachial DVT was also noted. Patient was initially treated with IV heparin.  Transitioned to Lovenox. Will eventually transition to oral anticoagulation depending on clinical stability.  Moderate to severe malnutrition Postoperative short bowel syndrome/high output ostomy, improving Malabsorption likely acute on chronic -Diet liberalized to increase calorie count, recommend family bring in food from outside facility if necessary NG tube remains out.  Oral intake not quite adequate according to nutritionist.  Discussed with patient today.  She does not want the nasogastric tube to be reinserted.  She will do  better with oral intake. Start mobilizing.  Infectious colitis due to C. difficile on treatment, POA -See above -High output ostomy, continue to  supplement p.o. and IV hydration as appropriate -P.o. vancomycin ongoing per ID  Intractable pain, multifactorial -Combination of postoperative pain, colitis, septic emboli as well as fungemia -Moderate improvement with initiation of p.o. oxycodone, allergy for oxycodone and Dilaudid deleted out of patient's chart per our discussion and tolerance of these medications -Continues with fentanyl patch, low-dose  Thrombocytosis  -Likely secondary to above infection - likely playing role in septic emboli This was discussed with hematology/oncology by previous providers.  Studies were ordered including BCR-ABL 1, CML/AL L PCR quantitative.  JAK2 was also ordered.  These are pending. Ferritin was 336, iron 11, TIBC 169.  ESR 123. Platelet counts are stable for the most part.  Longstanding Crohn's disease; not chronically on steroids so less concern for adrenal insufficiency -Hold steroids/Skyrizi in the setting of ongoing infection.  Sinus tachycardia Likely multifactorial secondary to all of her acute issues including pericardial effusion.  She continues to have significant diarrhea.  Oral intake has been poor.  Could be in negative fluid balance.  Will gently hydrate her and reevaluate. TSH was 4.87 earlier this month.   Type 2 diabetes -SSI PRN, has not needed much insulin currently due to poor p.o. intake -Goal BG 140-180   History of seizures -Continue PTA keppra/gabapentin   History of anxiety -Resume home Xanax, Cymbalta, hydroxyzine   History of hypertension -Hold home antihypertensives given infection/sepsis and prior hypotension. Resume as appropriate.   History of COPD -Continue xopenex PRN -Switch PTA spiriva to yupelri per forumulary   GERD -Continue PTA PPI   Anemia  -Transfuse for Hb <7 or hemodynamically significant  bleeding Hemoglobin has been stable.   Hypokalemia/hypomagnesemia -Repleted, monitor  Cardiac arrest secondary to septic shock requiring CPR on admission Echocardiogram on admit within normal limits  Pericardial effusion without tamponade Will need 6 to 8-week follow-up with Dr. Diona Browner in the outpatient setting for repeat echo Could be contributing to her sinus tachycardia.   Seizure disorder Continue Keppra   Non-insulin-dependent diabetes type 2, well-controlled  -A1c 5.8 -advance diet as tolerated, continue hypoglycemic protocol and sliding scale insulin   DVT prophylaxis: Lovenox Code Status:   Code Status: Full Code Family Communication: Daughter updated over the phone Disposition: Inpatient rehabilitation when stable  Consultants:  PCCM, ID, general surgery  Procedures:  Colectomy with ostomy placement  Antimicrobials:  Ceftriaxone, Flagyl, micafungin, PO vancomycin  Subjective: Feels well.  Weak.  Itching is better.  No new complaints offered.  Promises to do better job with the eating her meals.  Objective: Vitals:   02/23/23 2010 02/23/23 2315 02/24/23 0100 02/24/23 0334  BP: (!) 128/59 119/60  (!) 110/56  Pulse: (!) 118 (!) 118 (!) 115 (!) 113  Resp: 13 12 13 11   Temp: 98 F (36.7 C) 97.8 F (36.6 C)  98.2 F (36.8 C)  TempSrc: Oral Oral  Oral  SpO2: 95% 92% 92% 91%  Weight:      Height:        Intake/Output Summary (Last 24 hours) at 02/24/2023 0912 Last data filed at 02/24/2023 7253 Gross per 24 hour  Intake 793.86 ml  Output 3365 ml  Net -2571.14 ml   Filed Weights   02/20/23 0520 02/21/23 0426 02/23/23 0438  Weight: 65 kg 64.8 kg 70.7 kg    Examination:  General appearance: Awake alert.  In no distress Resp: Clear to auscultation bilaterally.  Normal effort Cardio: S1-S2 is normal regular.  No S3-S4.  No rubs murmurs or bruit GI: Abdomen is soft.  Colostomy is noted. Extremities: No edema.   Neurologic: Deconditioned.  No obvious  focal neurological deficits.   Data Reviewed: I have personally reviewed following labs and reports of imaging studies  CBC: Recent Labs  Lab 02/20/23 0341 02/21/23 0326 02/22/23 0500 02/23/23 0515 02/24/23 0441  WBC 35.2* 32.4* 27.3* 28.0* 23.7*  NEUTROABS  --   --  20.3*  --   --   HGB 10.0* 9.7* 9.6* 9.3* 9.4*  HCT 31.1* 30.6* 30.0* 29.5* 29.1*  MCV 86.1 86.0 86.5 86.8 86.9  PLT 1,097* 1,389* 1,383* 1,447* 1,272*   Basic Metabolic Panel: Recent Labs  Lab 02/18/23 0539 02/19/23 0359 02/20/23 0341 02/24/23 0441  NA 134* 134* 131* 131*  K 4.4 3.7 4.3 4.0  CL 99 95* 96* 94*  CO2 22 24 19* 28  GLUCOSE 111* 105* 99 104*  BUN <5* <5* <5* 6  CREATININE 0.45 0.51 0.41* 0.45  CALCIUM 9.1 9.6 8.8* 8.9  MG 1.9  --   --   --    GFR: Estimated Creatinine Clearance: 76.6 mL/min (by C-G formula based on SCr of 0.45 mg/dL).  Liver Function Tests: Recent Labs  Lab 02/18/23 0539 02/19/23 0359 02/20/23 0341 02/24/23 0441  AST 14* 15 17 13*  ALT 7 10 7 7   ALKPHOS 139* 161* 242* 191*  BILITOT 0.5 0.5 0.6 0.3  PROT 5.6* 5.9* 6.0* 6.2*  ALBUMIN 2.5* 2.7* 2.7* 2.4*   CBG: Recent Labs  Lab 02/23/23 1124 02/23/23 1646 02/23/23 2009 02/23/23 2314 02/24/23 0333  GLUCAP 116* 116* 108* 110* 108*    Recent Results (from the past 240 hour(s))  Culture, blood (Routine X 2) w Reflex to ID Panel     Status: None   Collection Time: 02/17/23 12:42 PM   Specimen: BLOOD LEFT HAND  Result Value Ref Range Status   Specimen Description BLOOD LEFT HAND  Final   Special Requests   Final    BOTTLES DRAWN AEROBIC ONLY Blood Culture adequate volume   Culture   Final    NO GROWTH 5 DAYS Performed at [facility], [address], [place], [place] [address]    Report Status 02/22/2023 FINAL  Final  Culture, blood (Routine X 2) w Reflex to ID Panel     Status: None   Collection Time: 02/17/23 12:42 PM   Specimen: BLOOD LEFT HAND  Result Value Ref Range Status   Specimen  Description BLOOD LEFT HAND  Final   Special Requests   Final    BOTTLES DRAWN AEROBIC ONLY Blood Culture adequate volume   Culture   Final    NO GROWTH 5 DAYS Performed at [facility], [address] [place], [place] [address]    Report Status 02/22/2023 FINAL  Final    Radiology Studies: No results found.      Scheduled Meds:  acetaminophen  650 mg Oral Q6H   amitriptyline  25 mg Oral QHS   Chlorhexidine Gluconate Cloth  6 each Topical Daily   DULoxetine  30 mg Oral Daily   enoxaparin (LOVENOX) injection  70 mg Subcutaneous Q12H   feeding supplement  237 mL Oral TID BM   fentaNYL  1 patch Transdermal Q72H   ferrous sulfate  325 mg Oral BID WC   folic acid  1 mg Intravenous Daily   gabapentin  100 mg Oral Q8H   Gerhardt's butt cream   Topical BID   insulin aspart  0-9 Units Subcutaneous Q4H   lidocaine  2 patch Transdermal Q24H   lidocaine  20 mL Infiltration Once   loperamide  4 mg Oral QID   loratadine  10 mg Oral Daily   methocarbamol  1,000 mg Oral TID   metoprolol tartrate  12.5 mg Oral BID   multivitamin with minerals  1 tablet Oral Daily   pantoprazole (PROTONIX) IV  40 mg Intravenous Q24H   revefenacin  175 mcg Nebulization Daily   thiamine (VITAMIN B1) injection  100 mg Intravenous Daily   vancomycin  125 mg Oral QID   Continuous Infusions:  cefTRIAXone (ROCEPHIN)  IV 2 g (02/23/23 2117)   levETIRAcetam 750 mg (02/23/23 2153)   metronidazole 500 mg (02/24/23 0644)   micafungin (MYCAMINE) 150 mg in sodium chloride 0.9 % 100 mL IVPB 150 mg (02/24/23 0358)     LOS: 28 days   Osvaldo Shipper,  [facility]  If 7PM-7AM, please contact night-coverage [email or web address]  02/24/2023, 9:12 AM

## 2023-02-24 NOTE — Progress Notes (Signed)
Mobility Specialist Progress Note:   02/24/23 1503  Mobility  Activity Refused mobility   Pt refused mobility d/t chest pain and fatigue. Will f/u as able.    Leory Plowman  Mobility Specialist Please contact via [employer] office at [phone]

## 2023-02-24 NOTE — Progress Notes (Signed)
OT Cancellation Note  Patient Details Name: [name] MRN: [id number] DOB: 1970/11/15   Cancelled Treatment:    Reason Eval/Treat Not Completed: Patient declined, no reason specified;Pain limiting ability to participate (Pt declined participation in skilled OT session x2 this day due to pain. Pt states she is agreeable to participation another day. Pt requesting pain medication, RN notified. OT to contiue to follow acutely as appropriate/available.)  [name] "Orson Eva., OTR/L, MA Acute Rehab [phone]  Lendon Colonel 02/24/2023, 4:45 PM

## 2023-02-25 ENCOUNTER — Inpatient Hospital Stay (HOSPITAL_COMMUNITY): Payer: Medicaid Other

## 2023-02-25 DIAGNOSIS — B49 Unspecified mycosis: Secondary | ICD-10-CM | POA: Diagnosis not present

## 2023-02-25 DIAGNOSIS — A0472 Enterocolitis due to Clostridium difficile, not specified as recurrent: Secondary | ICD-10-CM | POA: Diagnosis not present

## 2023-02-25 DIAGNOSIS — I2699 Other pulmonary embolism without acute cor pulmonale: Secondary | ICD-10-CM | POA: Diagnosis not present

## 2023-02-25 DIAGNOSIS — B379 Candidiasis, unspecified: Secondary | ICD-10-CM | POA: Diagnosis not present

## 2023-02-25 LAB — GLUCOSE, CAPILLARY
Glucose-Capillary: 89 mg/dL (ref 70–99)
Glucose-Capillary: 105 mg/dL — ABNORMAL HIGH (ref 70–99)
Glucose-Capillary: 100 mg/dL — ABNORMAL HIGH (ref 70–99)
Glucose-Capillary: 124 mg/dL — ABNORMAL HIGH (ref 70–99)
Glucose-Capillary: 104 mg/dL — ABNORMAL HIGH (ref 70–99)

## 2023-02-25 LAB — BASIC METABOLIC PANEL
Anion gap: 12 (ref 5–15)
BUN: 6 mg/dL (ref 6–20)
CO2: 27 mmol/L (ref 22–32)
Calcium: 9.3 mg/dL (ref 8.9–10.3)
Chloride: 95 mmol/L — ABNORMAL LOW (ref 98–111)
Creatinine, Ser: 0.43 mg/dL — ABNORMAL LOW (ref 0.44–1.00)
GFR, Estimated: >60 mL/min (ref >60)
Glucose, Bld: 113 mg/dL — ABNORMAL HIGH (ref 70–99)
Potassium: 4 mmol/L (ref 3.5–5.1)
Sodium: 134 mmol/L — ABNORMAL LOW (ref 135–145)

## 2023-02-25 LAB — CBC
HCT: 31.1 % — ABNORMAL LOW (ref 36.0–46.0)
Hemoglobin: 9.8 g/dL — ABNORMAL LOW (ref 12.0–15.0)
MCH: 27.3 pg (ref 26.0–34.0)
MCHC: 31.5 g/dL (ref 30.0–36.0)
MCV: 86.6 fL (ref 80.0–100.0)
Platelets: 1253 10*3/uL (ref 150–400)
RBC: 3.59 MIL/uL — ABNORMAL LOW (ref 3.87–5.11)
RDW: 16.2 % — ABNORMAL HIGH (ref 11.5–15.5)
WBC: 21.5 10*3/uL — ABNORMAL HIGH (ref 4.0–10.5)
nRBC: 0.1 % (ref 0.0–0.2)

## 2023-02-25 MED ORDER — IOHEXOL 350 MG/ML SOLN
75.0000 mL | Freq: Once | INTRAVENOUS | Status: AC | PRN
  Administered 2023-02-25: 75 mL via INTRAVENOUS

## 2023-02-25 MED ORDER — IOHEXOL 350 MG/ML SOLN
50.0000 mL | Freq: Once | INTRAVENOUS | Status: AC | PRN
  Administered 2023-02-25: 50 mL via INTRAVENOUS

## 2023-02-25 MED ORDER — DEXTROSE IN LACTATED RINGERS 5 % IV SOLN: INTRAVENOUS | Status: AC

## 2023-02-25 MED ORDER — DIPHENOXYLATE-ATROPINE 2.5-0.025 MG PO TABS
1.0000 | ORAL_TABLET | Freq: Two times a day (BID) | ORAL | Status: DC
  Administered 2023-02-25 – 2023-03-05 (×18): 1 via ORAL
  Filled 2023-02-25 (×18): qty 1

## 2023-02-25 NOTE — Progress Notes (Addendum)
7 Days Post-Op   Subjective/Chief Complaint: Patient concerned that her BP is too high and causing headache.   Objective: Vital signs in last 24 hours: Temp:  [97.8 F (36.6 C)-98.7 F (37.1 C)] 98.1 F (36.7 C) (10/25 0851) Pulse Rate:  [99-119] 114 (10/25 0851) Resp:  [13-22] 13 (10/25 0851) BP: (97-138)/(47-65) 125/60 (10/25 0851) SpO2:  [90 %-100 %] 90 % (10/25 0851) Last BM Date : 02/24/23  Intake/Output from previous day: 10/24 0701 - 10/25 0700 In: 1498.5 [P.O.:740; I.V.:359.7; IV Piggyback:398.8] Out: 3722 [Urine:2150; Drains:22; Stool:1550] Intake/Output this shift: No intake/output data recorded.  Abd soft approp tender ileostomy pink and productive of watery, bilious stool. LLQ/SP blake drain with purulent output. VAC to midline with good seal and minimal drainage   Lab Results:  Recent Labs    02/24/23 0441 02/25/23 0529  WBC 23.7* 21.5*  HGB 9.4* 9.8*  HCT 29.1* 31.1*  PLT 1,272* 1,253*   BMET Recent Labs    02/24/23 0441 02/25/23 0529  NA 131* 134*  K 4.0 4.0  CL 94* 95*  CO2 28 27  GLUCOSE 104* 113*  BUN 6 6  CREATININE 0.45 0.43*  CALCIUM 8.9 9.3   PT/INR No results for input(s): "LABPROT", "INR" in the last 72 hours. ABG No results for input(s): "PHART", "HCO3" in the last 72 hours.  Invalid input(s): "PCO2", "PO2"  Studies/Results: No results found.  Anti-infectives: Anti-infectives (From admission, onward)    Start     Dose/Rate Route Frequency Ordered Stop   02/18/23 1800  vancomycin (VANCOCIN) 50 mg/mL oral solution SOLN 125 mg        125 mg Oral 4 times daily 02/18/23 1439 02/26/23 0959   02/18/23 0400  micafungin (MYCAMINE) 150 mg in sodium chloride 0.9 % 100 mL IVPB        150 mg 107.5 mL/hr over 1 Hours Intravenous Every 24 hours 02/17/23 0936     02/17/23 0900  vancomycin (VANCOREADY) IVPB 750 mg/150 mL  Status:  Discontinued        750 mg 150 mL/hr over 60 Minutes Intravenous Every 8 hours 02/17/23 0204 02/17/23 0947    02/17/23 0400  micafungin (MYCAMINE) 100 mg in sodium chloride 0.9 % 100 mL IVPB  Status:  Discontinued        100 mg 105 mL/hr over 1 Hours Intravenous Every 24 hours 02/16/23 0353 02/17/23 0936   02/16/23 1045  vancomycin (VANCOCIN) 50 mg/mL oral solution SOLN 125 mg  Status:  Discontinued        125 mg Per Tube 4 times daily 02/16/23 0953 02/18/23 1439   02/16/23 0415  micafungin (MYCAMINE) 100 mg in sodium chloride 0.9 % 100 mL IVPB        100 mg 105 mL/hr over 1 Hours Intravenous STAT 02/16/23 0353 02/16/23 0610   02/15/23 1300  vancomycin (VANCOREADY) IVPB 750 mg/150 mL  Status:  Discontinued        750 mg 150 mL/hr over 60 Minutes Intravenous Every 12 hours 02/14/23 1156 02/17/23 0204   02/14/23 1130  vancomycin (VANCOCIN) IVPB 1000 mg/200 mL premix        1,000 mg 200 mL/hr over 60 Minutes Intravenous  Once 02/14/23 1043 02/14/23 1249   02/11/23 2000  cefTRIAXone (ROCEPHIN) 2 g in sodium chloride 0.9 % 100 mL IVPB       Note to Pharmacy: Pharmacy may adjust dosing strength / duration / interval for maximal efficacy   2 g 200 mL/hr over 30 Minutes  Intravenous Every 24 hours 02/11/23 1622     02/10/23 1600  metroNIDAZOLE (FLAGYL) IVPB 500 mg        500 mg 100 mL/hr over 60 Minutes Intravenous Every 12 hours 02/10/23 1444     02/08/23 1800  vancomycin (VANCOCIN) 50 mg/mL oral solution SOLN 500 mg        500 mg Per Tube Every 6 hours 02/08/23 1325 02/12/23 1759   02/03/23 2200  metroNIDAZOLE (FLAGYL) tablet 500 mg        500 mg Per Tube Every 12 hours 02/03/23 1406 02/06/23 2148   02/02/23 1815  vancomycin (VANCOCIN) 50 mg/mL oral solution SOLN 125 mg  Status:  Discontinued        125 mg Per Tube Every 6 hours 02/02/23 1717 02/08/23 1325   02/02/23 1545  vancomycin (VANCOCIN) capsule 125 mg  Status:  Discontinued        125 mg Oral 4 times daily 02/02/23 1530 02/02/23 1717   01/27/23 1430  cefTRIAXone (ROCEPHIN) 2 g in sodium chloride 0.9 % 100 mL IVPB        2 g 200 mL/hr over 30  Minutes Intravenous Every 24 hours 01/27/23 1340 02/05/23 1403   01/27/23 1400  metroNIDAZOLE (FLAGYL) IVPB 500 mg  Status:  Discontinued        500 mg 100 mL/hr over 60 Minutes Intravenous Every 12 hours 01/27/23 1340 02/03/23 1406   01/27/23 0545  vancomycin (VANCOREADY) IVPB 1250 mg/250 mL        1,250 mg 166.7 mL/hr over 90 Minutes Intravenous  Once 01/27/23 0542 01/27/23 0809   01/27/23 0545  ceFEPIme (MAXIPIME) 2 g in sodium chloride 0.9 % 100 mL IVPB        2 g 200 mL/hr over 30 Minutes Intravenous  Once 01/27/23 0542 01/27/23 0621   01/27/23 0545  metroNIDAZOLE (FLAGYL) IVPB 500 mg        500 mg 100 mL/hr over 60 Minutes Intravenous  Once 01/27/23 0542 01/27/23 0741       Assessment/Plan: Refractory Crohn's disease Fulminant C. Diff colitis with necrosis of proximal rectum and sigmoid colon, perforation of proximal rectum, large pelvic abscess  POD 14 s/p TAC and end ileostomy Dr. Luisa Hart 02/11/23 - no signs of undrained abdominal collection on CT 10/16 - TEE negative for endocarditis  - soft diet, QID imodium, fiber, iron, Added BID lomotil today - monitor output and titrate as needed (<1,000 out) - no PICC/TPN in setting of bacteremia/fungemia (BCx 10/17 NGTD), ID following for mangement of c.diff and other infectious w/u - Continue drain for now.  - mobilize - WOC following for new ileostomy and VAC M/R - CCS will follow up results of CT AP ordered by ID for today but may not see acutely over the weekend unless acute surgical issues arise   FEN: reg diet VTE: therapeutic LMWH ID: rocephin/flagyl/micafungin, PO vanc   - per CCM -  Acute DVT/PEs - [place] 10/19 with L brachial DVT and R internal jugular and brachial DVTs  Splenic infarcts - nothing surgical recommended at this time but agree with ID need to eval etiology of emboli  COPD HTN Chronic pain  Collagen vascular disease T2DM Seizure disorder  Juliet Rude, [facility] 02/25/2023, 10:21  AM Please see Amion for pager number during day hours 7:00am-4:30pm

## 2023-02-25 NOTE — Progress Notes (Signed)
Mobility Specialist Progress Note:   02/25/23 1533  Mobility  Activity Stood at bedside  Level of Assistance Minimal assist, patient does 75% or more  Assistive Device Front wheel walker  Activity Response Tolerated well  Mobility Referral Yes  $Mobility charge 1 Mobility  Mobility Specialist Start Time (ACUTE ONLY) 1455  Mobility Specialist Stop Time (ACUTE ONLY) 1512  Mobility Specialist Time Calculation (min) (ACUTE ONLY) 17 min   During Mobility: 123 HR  Post Mobility: 115 HR ,138/54 BP  Pt received in bed, declining ambulation but agreeable to stand at bedside with encouragement. MinA to stand. Standing tolerance limited d/t dizziness. Rated 8/10. VSS. Pt returned to supine position asymptomatic with call bell in reach and all needs met.  Leory Plowman  Mobility Specialist Please contact via [employer] office at [phone]

## 2023-02-25 NOTE — Progress Notes (Signed)
PROGRESS NOTE    [name]  ZOX:[id number] DOB: 11/14/1970 DOA: 01/27/2023 PCP: [name], PA-C   Brief Narrative:  This is a 52 year old female with past medical history of COPD, Crohn's on Skyrizi, hypertension, type 2 diabetes mellitus who initially presented to the after being found unresponsive. Per family, patient has had bloody emesis with diarrhea over the past few days. Initial systolics in the 60s in the ED, and patient was minimally responsive. In the emergency room, patient was intubated, and patient was admitted to the ICU for further evaluation and management for shock and altered mental status.   01/27/2023: Admitted to ICU and intubated.  Felt to have septic shock of unclear source which resolved antibiotic therapy and fluid resuscitation.  Suspicion was that she may have had C. difficile colitis despite negative toxin.  She was treated with vancomycin. 9/29 Off pressors 10/2: Extubated 10/3 transfer to floor. 10/10 underwent repeat proctosigmoidoscopy for ongoing diarrhea.  Diffuse severe inflammation identified due to combination of Crohn's disease, active C. difficile infection and possible ischemic colitis from prior hypotension on presentation. 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  Intraoperative findings include frank necrosis with pelvic abscess formation and significant colitis. 10/12 some dilated small bowel with minimal OG output.  Continued observation advised.  Having significant postoperative pain. 10/13 became tachycardic and hypotensive.  More lethargic.  Transferred back to ICU. 10/15 blood cultures reports fungemia, resume PO vancomycin 10/17 CT abdomen pelvis notable for septic emboli - CTA chest on hold given IV access issues 10/18 TEE negative for endocarditis/bubble study - NG out for TEE probe.  Advance diet, hoping to avoid replacement of NG tube 10/19 wound VAC dysfunctional, surgery switching to wet-to-dry over the weekend  -pain poorly controlled today, add oxycodone (medication lists this is an allergy but patient verbally approved) 10/20 pain control improving, transition from IV fentanyl to Dilaudid, continue p.o. oxycodone, restart home anxiolytics with Xanax 10/21 Successful left subclavian CVC placement with IR 10/22 ophthalmology consulted in regards to dilated exam in the setting of candidemia    Assessment & Plan:   Septic shock, multifactorial, POA Fungemia, candida glabrata, likely POA Cdiff Colitis, POA Septic emboli, thrombocytosis -Cultures reporting fungemia, fulminant C. difficile colitis improving with concurrent intra-abdominal abscess  -ID following: micafungin, PO vancomycin, ceftriaxone, Flagyl -Status post colectomy -Left subclavian tunneled CVC placed 02/21/2023 with IR -TEE 10/18 negative for vegetations, bubble study negative Patient seen by ophthalmology on 10/23.  No evidence for fungal infiltrates on funduscopic evaluation. CT of the chest abdomen and pelvis to be repeated per ID. WBC gradually improving.   Acute pulmonary emboli/upper extremity DVT CT scan from 10/16 showed evidence for pulmonary emboli. Lower extremity Doppler studies on 10/19 did not show any DVT in the lower extremities. Doppler studies of the upper extremities on 10/19 showed acute DVT involving the right IJ and right brachial.  Left brachial DVT was also noted. Patient was initially treated with IV heparin.  Transitioned to Lovenox. Will eventually transition to oral anticoagulation depending on clinical stability.  Moderate to severe malnutrition Postoperative short bowel syndrome/high output ostomy, improving Malabsorption likely acute on chronic NG tube remains out.  Oral intake not quite adequate according to nutritionist.  Discussed with patient.  She does not want the nasogastric tube to be reinserted.  She will do better with oral intake. Start mobilizing.  Infectious colitis due to C.  difficile on treatment, POA -See above -High output ostomy, continue to supplement p.o. and IV hydration as appropriate -  P.o. vancomycin ongoing per ID  Intractable pain, multifactorial -Combination of postoperative pain, colitis, septic emboli as well as fungemia Noted to be on fentanyl patch along with as needed medications for pain.  Thrombocytosis  -Likely secondary to above infection - likely playing role in septic emboli This was discussed with hematology/oncology by previous providers.  Studies were ordered including BCR-ABL 1, CML/AL L PCR quantitative.  JAK2 was also ordered.  These are pending. Ferritin was 336, iron 11, TIBC 169.  ESR 123. Platelet counts are stable for the most part.  Longstanding Crohn's disease; not chronically on steroids so less concern for adrenal insufficiency -Hold steroids/Skyrizi in the setting of ongoing infection.  Sinus tachycardia Likely multifactorial secondary to all of her acute issues including pericardial effusion.  She continues to have significant diarrhea.  Oral intake has been poor.  Could be in negative fluid balance.  She was given IV fluids yesterday.  Slight improvement in heart rate noted.  Will give another liter of IV fluids over the course of the day today. TSH was 4.87 earlier this month.   Type 2 diabetes -SSI PRN, has not needed much insulin currently due to poor p.o. intake -Goal BG 140-180   History of seizures -Continue PTA keppra/gabapentin   History of anxiety -Resume home Xanax, Cymbalta, hydroxyzine   History of hypertension -Hold home antihypertensives given infection/sepsis and prior hypotension. Resume as appropriate.   History of COPD -Continue xopenex PRN -Switch PTA spiriva to yupelri per forumulary   GERD -Continue PTA PPI   Normocytic Anemia  -Transfuse for Hb <7 or hemodynamically significant bleeding Hemoglobin has been stable.   Hypokalemia/hypomagnesemia -Repleted, monitor  Cardiac arrest  secondary to septic shock requiring CPR on admission Echocardiogram on admit within normal limits  Pericardial effusion without tamponade Will need 6 to 8-week follow-up with Dr. Diona Browner in the outpatient setting for repeat echo Could be contributing to her sinus tachycardia.   Seizure disorder Continue Keppra   Non-insulin-dependent diabetes type 2, well-controlled  -A1c 5.8.  Monitor CBGs.  Reasonably well-controlled.  DVT prophylaxis: Lovenox Code Status:   Code Status: Full Code Family Communication: No family at bedside Disposition: Inpatient rehabilitation when stable  Consultants:  PCCM, ID, general surgery  Procedures:  Colectomy with ostomy placement  Antimicrobials:  Ceftriaxone, Flagyl, micafungin, PO vancomycin  Subjective: No complaints offered this morning.  Specifically no chest pain abdominal pain shortness of breath.  Objective: Vitals:   02/24/23 2358 02/24/23 2359 02/25/23 0443 02/25/23 0851  BP: (!) 97/47  (!) 138/54 125/60  Pulse: (!) 111 (!) 113 (!) 115 (!) 114  Resp: 15 16 16 13   Temp: 98.7 F (37.1 C)  98.4 F (36.9 C) 98.1 F (36.7 C)  TempSrc: Oral  Oral Oral  SpO2: 94% 94% 94% 90%  Weight:      Height:        Intake/Output Summary (Last 24 hours) at 02/25/2023 1113 Last data filed at 02/25/2023 1000 Gross per 24 hour  Intake 1058.49 ml  Output 3197 ml  Net -2138.51 ml   Filed Weights   02/20/23 0520 02/21/23 0426 02/23/23 0438  Weight: 65 kg 64.8 kg 70.7 kg    Examination:  General appearance: Awake alert.  In no distress Resp: Clear to auscultation bilaterally.  Normal effort Cardio: S1-S2 is tachycardic regular. Wound VAC ostomy noted on abdominal examination. No edema lower extremities.  Reasonably good range of motion.   Data Reviewed: I have personally reviewed following labs and reports of imaging  studies  CBC: Recent Labs  Lab 02/21/23 0326 02/22/23 0500 02/23/23 0515 02/24/23 0441 02/25/23 0529  WBC 32.4*  27.3* 28.0* 23.7* 21.5*  NEUTROABS  --  20.3*  --   --   --   HGB 9.7* 9.6* 9.3* 9.4* 9.8*  HCT 30.6* 30.0* 29.5* 29.1* 31.1*  MCV 86.0 86.5 86.8 86.9 86.6  PLT 1,389* 1,383* 1,447* 1,272* 1,253*   Basic Metabolic Panel: Recent Labs  Lab 02/19/23 0359 02/20/23 0341 02/24/23 0441 02/25/23 0529  NA 134* 131* 131* 134*  K 3.7 4.3 4.0 4.0  CL 95* 96* 94* 95*  CO2 24 19* 28 27  GLUCOSE 105* 99 104* 113*  BUN <5* <5* 6 6  CREATININE 0.51 0.41* 0.45 0.43*  CALCIUM 9.6 8.8* 8.9 9.3   GFR: Estimated Creatinine Clearance: 76.6 mL/min (A) (by C-G formula based on SCr of 0.43 mg/dL (L)).  Liver Function Tests: Recent Labs  Lab 02/19/23 0359 02/20/23 0341 02/24/23 0441  AST 15 17 13*  ALT 10 7 7   ALKPHOS 161* 242* 191*  BILITOT 0.5 0.6 0.3  PROT 5.9* 6.0* 6.2*  ALBUMIN 2.7* 2.7* 2.4*   CBG: Recent Labs  Lab 02/24/23 1609 02/24/23 2022 02/24/23 2355 02/25/23 0441 02/25/23 0954  GLUCAP 87 104* 124* 89 105*    Recent Results (from the past 240 hour(s))  Culture, blood (Routine X 2) w Reflex to ID Panel     Status: None   Collection Time: 02/17/23 12:42 PM   Specimen: BLOOD LEFT HAND  Result Value Ref Range Status   Specimen Description BLOOD LEFT HAND  Final   Special Requests   Final    BOTTLES DRAWN AEROBIC ONLY Blood Culture adequate volume   Culture   Final    NO GROWTH 5 DAYS Performed at [facility], [address] [place], [place] [address]    Report Status 02/22/2023 FINAL  Final  Culture, blood (Routine X 2) w Reflex to ID Panel     Status: None   Collection Time: 02/17/23 12:42 PM   Specimen: BLOOD LEFT HAND  Result Value Ref Range Status   Specimen Description BLOOD LEFT HAND  Final   Special Requests   Final    BOTTLES DRAWN AEROBIC ONLY Blood Culture adequate volume   Culture   Final    NO GROWTH 5 DAYS Performed at [facility], [address] [place], [place] [address]    Report Status 02/22/2023 FINAL  Final    Radiology  Studies: No results found.      Scheduled Meds:  acetaminophen  650 mg Oral Q6H   amitriptyline  25 mg Oral QHS   Chlorhexidine Gluconate Cloth  6 each Topical Daily   diphenoxylate-atropine  1 tablet Oral BID   DULoxetine  30 mg Oral Daily   enoxaparin (LOVENOX) injection  70 mg Subcutaneous Q12H   feeding supplement  237 mL Oral TID BM   fentaNYL  1 patch Transdermal Q72H   ferrous sulfate  325 mg Oral BID WC   folic acid  1 mg Intravenous Daily   gabapentin  100 mg Oral Q8H   Gerhardt's butt cream   Topical BID   insulin aspart  0-9 Units Subcutaneous Q4H   lidocaine  2 patch Transdermal Q24H   lidocaine  20 mL Infiltration Once   loperamide  4 mg Oral QID   loratadine  10 mg Oral Daily   methocarbamol  1,000 mg Oral TID   metoprolol tartrate  12.5 mg Oral BID   multivitamin with minerals  1 tablet Oral Daily   pantoprazole (PROTONIX) IV  40 mg Intravenous Q24H   revefenacin  175 mcg Nebulization Daily   thiamine (VITAMIN B1) injection  100 mg Intravenous Daily   vancomycin  125 mg Oral QID   Continuous Infusions:  cefTRIAXone (ROCEPHIN)  IV Stopped (02/24/23 2042)   levETIRAcetam 750 mg (02/25/23 1009)   metronidazole 500 mg (02/25/23 0503)   micafungin (MYCAMINE) 150 mg in sodium chloride 0.9 % 100 mL IVPB 150 mg (02/25/23 0631)     LOS: 29 days   Osvaldo Shipper,  [facility]  If 7PM-7AM, please contact night-coverage [email or web address]  02/25/2023, 11:13 AM

## 2023-02-25 NOTE — Progress Notes (Signed)
[facility]  Date of Admission:  01/27/2023     Principal Problem:   Fungemia Active Problems:   Shock (HCC)   Metabolic encephalopathy   Cardiac arrest (HCC)   Acute respiratory failure with hypoxia (HCC)   Upper GI bleed   Granulomatous colitis (HCC)   Pseudomembranous colitis   Crohn's disease without complication (HCC)   Ileus (HCC)   C. 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  TEE did not show vegetations.  Repeat blood cultures on 10/17 remain negative. - Central line placed on 10/21 -Upper extremity and lower extremity Dopplers are negative #Intrabdominal infection Repeat CT is reassuring few locules of free air in abdomen likely postoperative.  Continue ceftriaxone and metronidazole for now -CTA chest on 10/25 showed stable b/l embolic, RLL opacity. No respiratory complaints and on RA. -Optho note no fungal infiltrates on DFE #C diff illietis - On 9/29 C. difficile antigen and PCR positive, toxin negative -Diarrhea is improving.  Continue p.o. vancomycin for now Recommendations: -Continue micafungin, follow-up with Candida glabrata sensitivities.  Needs to be seen by ophthalmology or neuro in setting of candidemia.  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 vancomycin 4 times daily  Dr.  Luciana Axe is covering this weekend. Ill be back on service on Monday  Microbiology:   Antibiotics: Ceftriaxone, metronidazole, micafungin, p.o. vancomycin  SUBJECTIVE: Resting in bed.  Reports diarrhea is improving. Interval: Afebrile overnight, WBC 21K  Review of Systems: Review of Systems  All other systems reviewed and are negative.    Scheduled Meds:  acetaminophen  650 mg Oral Q6H   amitriptyline  25 mg Oral QHS   Chlorhexidine Gluconate Cloth  6 each Topical Daily   diphenoxylate-atropine  1 tablet Oral BID   DULoxetine  30 mg Oral Daily   enoxaparin (LOVENOX) injection  70 mg Subcutaneous Q12H   feeding supplement  237 mL Oral TID BM   fentaNYL  1 patch Transdermal Q72H   ferrous sulfate  325 mg Oral BID WC   folic acid  1 mg Intravenous Daily   gabapentin  100 mg Oral Q8H   Gerhardt's butt cream   Topical BID   insulin aspart  0-9 Units Subcutaneous Q4H   lidocaine  2 patch Transdermal Q24H   lidocaine  20 mL Infiltration Once   loperamide  4 mg Oral QID   loratadine  10 mg Oral Daily   methocarbamol  1,000 mg Oral TID   metoprolol tartrate  12.5 mg Oral BID   multivitamin with minerals  1 tablet Oral Daily   pantoprazole (PROTONIX) IV  40 mg Intravenous Q24H   revefenacin  175 mcg Nebulization Daily   thiamine (VITAMIN B1) injection  100 mg Intravenous Daily   vancomycin  125 mg Oral QID   Continuous Infusions:  cefTRIAXone (ROCEPHIN)  IV 2 g (02/25/23 2059)   levETIRAcetam 750 mg (02/25/23 2307)   metronidazole 500 mg (02/25/23 1751)   micafungin (MYCAMINE) 150 mg in sodium chloride 0.9 % 100 mL IVPB Stopped (02/25/23 0731)   PRN Meds:.ALPRAZolam, bismuth subsalicylate, camphor-menthol, dextrose, HYDROmorphone (DILAUDID) injection, levalbuterol, magic mouthwash, metoprolol tartrate, ondansetron (ZOFRAN) IV, mouth rinse, oxyCODONE Allergies  Allergen Reactions   Codeine Shortness Of Breath, Swelling and Rash    Throat swelling   Hydrocodone Shortness Of  Breath, Swelling and Rash   Ketorolac Nausea And Vomiting and Swelling    Pt reports n/v and swelling to throat   Morphine And Codeine Shortness Of Breath, Swelling and Rash   Penicillins Shortness Of Breath, Swelling and Other (See Comments)    Throat swells 02/06/21--TOLERATES CEFTRIAXONE     Nickel Rash   Pregabalin Other (See Comments)   Acetaminophen     Per MD patient states she can't take Tylenol because of liver enzymes   Atarax [Hydroxyzine] Itching and Swelling    Mildly swollen throat   Ativan [Lorazepam] Other (See Comments)    Migraines.   Strawberry Extract Swelling   Watermelon Concentrate Swelling   Albuterol Palpitations   Benadryl [Diphenhydramine Hcl] Palpitations   Humira [Adalimumab] Rash   Latex Rash   Remicade [Infliximab] Rash    Blisters and Welts    Tramadol Rash    Rash and itching    OBJECTIVE: Vitals:   02/25/23 1100 02/25/23 1446 02/25/23 1743 02/25/23 2010  BP: (!) 122/58  (!) 127/53 127/67  Pulse:   (!) 119 (!) 118  Resp: 14  15 15   Temp: 97.6 F (36.4 C) 98.2 F (36.8 C) 97.6 F (36.4 C) 98 F (36.7 C)  TempSrc: Oral Oral Oral Oral  SpO2: 95%  93% 93%  Weight:      Height:       Body mass index is 27.62 kg/m.  Physical Exam Constitutional:      Appearance: Normal appearance.  HENT:     Head: Normocephalic and atraumatic.     Right Ear: Tympanic membrane normal.     Left Ear: Tympanic membrane normal.     Nose: Nose normal.     Mouth/Throat:     Mouth: Mucous membranes are moist.  Eyes:     Extraocular Movements: Extraocular movements intact.     Conjunctiva/sclera: Conjunctivae normal.     Pupils: Pupils are equal, round, and reactive to light.  Cardiovascular:     Rate and Rhythm: Normal rate and regular rhythm.     Heart sounds: No murmur heard.    No friction rub. No gallop.  Pulmonary:     Effort: Pulmonary effort is normal.     Breath sounds: Normal breath sounds.  Musculoskeletal:        General: Normal range  of motion.  Skin:    General: Skin is warm and dry.  Neurological:     General: No focal deficit present.     Mental Status: She is alert and oriented to person, place, and time.  Psychiatric:        Mood and Affect: Mood normal.   RUQ colostomy LLQ drain    Lab Results Lab Results  Component Value Date   WBC 21.5 (H) 02/25/2023   HGB 9.8 (L) 02/25/2023   HCT 31.1 (L) 02/25/2023   MCV 86.6 02/25/2023   PLT 1,253 (HH) 02/25/2023    Lab Results  Component Value Date   CREATININE 0.43 (L) 02/25/2023   BUN  6 02/25/2023   NA 134 (L) 02/25/2023   K 4.0 02/25/2023   CL 95 (L) 02/25/2023   CO2 27 02/25/2023    Lab Results  Component Value Date   ALT 7 02/24/2023   AST 13 (L) 02/24/2023   ALKPHOS 191 (H) 02/24/2023   BILITOT 0.3 02/24/2023        Christine Earthly, MD [facility] 02/25/2023, 11:19 PM I have personally spent 51 minutes involved in face-to-face and non-face-to-face activities for this patient on the day of the visit. Professional time spent includes the following activities: Preparing to see the patient (review of tests), Obtaining and/or reviewing separately obtained history (admission/discharge record), Performing a medically appropriate examination and/or evaluation , Ordering medications/tests/procedures, referring and communicating with other health care professionals, Documenting clinical information in the EMR, Independently interpreting results (not separately reported), Communicating results to the patient/family/caregiver, Counseling and educating the patient/family/caregiver and Care coordination (not separately reported).

## 2023-02-25 NOTE — Progress Notes (Signed)
Physical Therapy Treatment Patient Details Name: [name] MRN: [id number] DOB: [birth year] Today's Date: 02/25/2023   History of Present Illness 52 y.o. female presents to [facility] hospital on 01/27/2023 after being found unresponsive at home down in a pool of bloody vomit/stool. CPR was initiated upon arrival to ED, pt intubated and started on pressors. Pt undergoing management for inflammatory vs infections colitis. Pt extubated on 10/2. 10/10 underwent repeat proctosigmoidoscopy for ongoing diarrhea.  Diffuse severe inflammation identified due to combination of Crohn's disease, active C. difficile infection and possible ischemic colitis from prior hypotension on presentation. 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 10/13 became tachycardic and hypotensive.  More lethargic.  Transferred back to ICU. PMH includes COPD, Crohn's disease, HTN, DMII.    PT Comments  Pt received in supine and agreeable to session. Pt reports improved pain today and appears in better spirits. Pt able to perform all mobility tasks with CGA for safety and increased time due to pain. Pt able to progress to a very short gait trial, however is limited by dizziness. Pt continues to benefit from PT services to progress toward functional mobility goals.     If plan is discharge home, recommend the following: A lot of help with bathing/dressing/bathroom;Two people to help with walking and/or transfers;Assistance with cooking/housework;Direct supervision/assist for medications management;Assist for transportation;Supervision due to cognitive status;Direct supervision/assist for financial management;Help with stairs or ramp for entrance   Can travel by private vehicle     No  Equipment Recommendations  Rolling walker (2 wheels);BSC/3in1;Wheelchair (measurements PT);Wheelchair cushion (measurements PT)    Recommendations for Other Services       Precautions / Restrictions Precautions Precautions: Fall;Other  (comment) Precaution Comments: watch HR/RR, bulb drain L, new colostomy, abdominal wound vac Restrictions Weight Bearing Restrictions: No     Mobility  Bed Mobility Overal bed mobility: Needs Assistance Bed Mobility: Sit to Supine, Supine to Sit     Supine to sit: Supervision, Used rails, HOB elevated Sit to supine: Supervision   General bed mobility comments: assist for line management. increased time due to pain    Transfers Overall transfer level: Needs assistance Equipment used: Rolling walker (2 wheels) Transfers: Sit to/from Stand Sit to Stand: Contact guard assist           General transfer comment: from EOB and recliner with cues for hand placement and increased time to reach a full upright posture    Ambulation/Gait Ambulation/Gait assistance: Contact guard assist Gait Distance (Feet): 5 Feet Assistive device: Rolling walker (2 wheels) Gait Pattern/deviations: Trunk flexed, Decreased stride length, Shuffle, Step-through pattern Gait velocity: decreased     General Gait Details: Pt taking very short steps with heavy reliance on RW support for stability. CGA and chair follow for safety, but no overt LOB. Pt with dizziness limiting distance.       Balance Overall balance assessment: Needs assistance Sitting-balance support: Feet supported, Bilateral upper extremity supported Sitting balance-Leahy Scale: Fair Sitting balance - Comments: sitting EOB   Standing balance support: Bilateral upper extremity supported, Reliant on assistive device for balance, During functional activity Standing balance-Leahy Scale: Poor Standing balance comment: with RW support                            Cognition Arousal: Alert Behavior During Therapy: WFL for tasks assessed/performed Overall Cognitive Status: Within Functional Limits for tasks assessed  Exercises      General Comments General comments  (skin integrity, edema, etc.): Pt reporting dizziness during ambulation. BP 136/82      Pertinent Vitals/Pain Pain Assessment Pain Assessment: 0-10 Pain Score: 5  Pain Location: abdomen Pain Descriptors / Indicators: Discomfort, Grimacing, Guarding Pain Intervention(s): Limited activity within patient's tolerance, Monitored during session, Repositioned     PT Goals (current goals can now be found in the care plan section) Acute Rehab PT Goals Patient Stated Goal: go home PT Goal Formulation: With patient Time For Goal Achievement: 02/17/23 Progress towards PT goals: Progressing toward goals    Frequency    Min 1X/week       AM-PAC PT "6 Clicks" Mobility   Outcome Measure  Help needed turning from your back to your side while in a flat bed without using bedrails?: A Little Help needed moving from lying on your back to sitting on the side of a flat bed without using bedrails?: A Little Help needed moving to and from a bed to a chair (including a wheelchair)?: A Little Help needed standing up from a chair using your arms (e.g., wheelchair or bedside chair)?: A Little Help needed to walk in hospital room?: A Little Help needed climbing 3-5 steps with a railing? : Total 6 Click Score: 16    End of Session Equipment Utilized During Treatment: Gait belt Activity Tolerance: Patient tolerated treatment well;Treatment limited secondary to medical complications (Comment);Patient limited by fatigue (dizziness) Patient left: with call bell/phone within reach;in bed Nurse Communication: Mobility status PT Visit Diagnosis: Other abnormalities of gait and mobility (R26.89);Muscle weakness (generalized) (M62.81)     Time: 1020-1055 PT Time Calculation (min) (ACUTE ONLY): 35 min  Charges:    $Therapeutic Activity: 23-37 mins PT General Charges $$ ACUTE PT VISIT: 1 Visit                    [name], PTA Acute Rehabilitation Services Secure Chat Preferred  [phone]  [phone]    [name] 02/25/2023, 11:11 AM

## 2023-02-26 ENCOUNTER — Inpatient Hospital Stay (HOSPITAL_COMMUNITY): Payer: Medicaid Other

## 2023-02-26 DIAGNOSIS — A0472 Enterocolitis due to Clostridium difficile, not specified as recurrent: Secondary | ICD-10-CM | POA: Diagnosis not present

## 2023-02-26 DIAGNOSIS — I2699 Other pulmonary embolism without acute cor pulmonale: Secondary | ICD-10-CM | POA: Diagnosis not present

## 2023-02-26 DIAGNOSIS — B49 Unspecified mycosis: Secondary | ICD-10-CM | POA: Diagnosis not present

## 2023-02-26 LAB — BCR-ABL1, CML/ALL, PCR, QUANT
E1A2 Transcript: <0.0032 %
Interpretation (BCRAL):: NEGATIVE
b2a2 transcript: <0.0032 %
b3a2 transcript: <0.0032 %

## 2023-02-26 LAB — GLUCOSE, CAPILLARY
Glucose-Capillary: 100 mg/dL — ABNORMAL HIGH (ref 70–99)
Glucose-Capillary: 101 mg/dL — ABNORMAL HIGH (ref 70–99)
Glucose-Capillary: 102 mg/dL — ABNORMAL HIGH (ref 70–99)
Glucose-Capillary: 109 mg/dL — ABNORMAL HIGH (ref 70–99)
Glucose-Capillary: 109 mg/dL — ABNORMAL HIGH (ref 70–99)
Glucose-Capillary: 122 mg/dL — ABNORMAL HIGH (ref 70–99)

## 2023-02-26 LAB — CBC
HCT: 32.1 % — ABNORMAL LOW (ref 36.0–46.0)
Hemoglobin: 10.2 g/dL — ABNORMAL LOW (ref 12.0–15.0)
MCH: 27.4 pg (ref 26.0–34.0)
MCHC: 31.8 g/dL (ref 30.0–36.0)
MCV: 86.3 fL (ref 80.0–100.0)
Platelets: 1095 10*3/uL (ref 150–400)
RBC: 3.72 MIL/uL — ABNORMAL LOW (ref 3.87–5.11)
RDW: 16.3 % — ABNORMAL HIGH (ref 11.5–15.5)
WBC: 19 10*3/uL — ABNORMAL HIGH (ref 4.0–10.5)
nRBC: 0 % (ref 0.0–0.2)

## 2023-02-26 NOTE — Plan of Care (Signed)
  Problem: Education: Goal: Knowledge of General Education information will improve Description Including pain rating scale, medication(s)/side effects and non-pharmacologic comfort measures Outcome: Progressing   Problem: Health Behavior/Discharge Planning: Goal: Ability to manage health-related needs will improve Outcome: Progressing   Problem: Clinical Measurements: Goal: Ability to maintain clinical measurements within normal limits will improve Outcome: Progressing Goal: Will remain free from infection Outcome: Progressing Goal: Diagnostic test results will improve Outcome: Progressing Goal: Respiratory complications will improve Outcome: Progressing   Problem: Elimination: Goal: Will not experience complications related to bowel motility Outcome: Progressing Goal: Will not experience complications related to urinary retention Outcome: Progressing   Problem: Pain Managment: Goal: General experience of comfort will improve Outcome: Progressing   Problem: Safety: Goal: Ability to remain free from injury will improve Outcome: Progressing

## 2023-02-26 NOTE — Progress Notes (Signed)
Patient presented to radiology for thoracentesis today.   Left pleural space visualized with [place], patient has moderate pleural effusion.  Benefits and risks of the procedure discussed in detail with the patient, she was informed that the procedure was ordered for therapeutic and diagnostic purpose.   After thorough discussion, patient declined thoracentesis.  Thoracentesis was NOT performed.   RN/MD notified.    Lynann Bologna Gauri Galvao PA-C 02/26/2023 12:50 PM

## 2023-02-26 NOTE — Progress Notes (Signed)
PROGRESS NOTE    [name]  ION:[id number] DOB: 1970-12-25 DOA: 01/27/2023 PCP: [name], PA-C   Brief Narrative:  This is a 52 year old female with past medical history of COPD, Crohn's on Skyrizi, hypertension, type 2 diabetes mellitus who initially presented to the after being found unresponsive. Per family, patient has had bloody emesis with diarrhea over the past few days. Initial systolics in the 60s in the ED, and patient was minimally responsive. In the emergency room, patient was intubated, and patient was admitted to the ICU for further evaluation and management for shock and altered mental status.   01/27/2023: Admitted to ICU and intubated.  Felt to have septic shock of unclear source which resolved antibiotic therapy and fluid resuscitation.  Suspicion was that she may have had C. difficile colitis despite negative toxin.  She was treated with vancomycin. 9/29 Off pressors 10/2: Extubated 10/3 transfer to floor. 10/10 underwent repeat proctosigmoidoscopy for ongoing diarrhea.  Diffuse severe inflammation identified due to combination of Crohn's disease, active C. difficile infection and possible ischemic colitis from prior hypotension on presentation. 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  Intraoperative findings include frank necrosis with pelvic abscess formation and significant colitis. 10/12 some dilated small bowel with minimal OG output.  Continued observation advised.  Having significant postoperative pain. 10/13 became tachycardic and hypotensive.  More lethargic.  Transferred back to ICU. 10/15 blood cultures reports fungemia, resume PO vancomycin 10/17 CT abdomen pelvis notable for septic emboli - CTA chest on hold given IV access issues 10/18 TEE negative for endocarditis/bubble study - NG out for TEE probe.  Advance diet, hoping to avoid replacement of NG tube 10/19 wound VAC dysfunctional, surgery switching to wet-to-dry over the weekend  -pain poorly controlled today, add oxycodone (medication lists this is an allergy but patient verbally approved) 10/20 pain control improving, transition from IV fentanyl to Dilaudid, continue p.o. oxycodone, restart home anxiolytics with Xanax 10/21 Successful left subclavian CVC placement with IR 10/22 ophthalmology consulted in regards to dilated exam in the setting of candidemia    Assessment & Plan:   Septic shock, multifactorial, POA Fungemia, candida glabrata, likely POA Cdiff Colitis, POA Septic emboli, thrombocytosis -Cultures reporting fungemia, fulminant C. difficile colitis improving with concurrent intra-abdominal abscess  -ID following: micafungin, PO vancomycin, ceftriaxone, Flagyl -Status post colectomy -Left subclavian tunneled CVC placed 02/21/2023 with IR -TEE 10/18 negative for vegetations, bubble study negative Patient seen by ophthalmology on 10/23.  No evidence for fungal infiltrates on funduscopic evaluation. CT of the chest abdomen and pelvis to be repeated per ID. WBC gradually improving.  She remains stable for the most part.   Acute pulmonary emboli/upper extremity DVT CT scan from 10/16 showed evidence for pulmonary emboli. Lower extremity Doppler studies on 10/19 did not show any DVT in the lower extremities. Doppler studies of the upper extremities on 10/19 showed acute DVT involving the right IJ and right brachial.  Left brachial DVT was also noted. Patient was initially treated with IV heparin.  Transitioned to Lovenox. Will eventually transition to oral anticoagulation depending on clinical stability.  Moderate to severe malnutrition Postoperative short bowel syndrome/high output ostomy, improving Malabsorption likely acute on chronic NG tube remains out.  Oral intake not quite adequate according to nutritionist.  Discussed with patient.  She does not want the nasogastric tube to be reinserted.  She mentions that appetite is improving.  She was  encouraged to do a better job of eating and drinking.  Continue to mobilize.  Infectious  colitis due to C. difficile on treatment, POA -High output ostomy, continue to supplement p.o. and IV hydration as appropriate -P.o. vancomycin ongoing per ID.  Diarrhea appears to be slowing down.  Left-sided pleural effusion Discussed with patient and her daughter.  Will proceed with thoracentesis for drainage and for fluid analysis.  Contrast in the vagina This was seen on CT scan from 02/25/2023.  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  Will discuss with urology.  Intractable pain, multifactorial -Combination of postoperative pain, colitis, septic emboli as well as fungemia Noted to be on fentanyl patch along with as needed medications for pain.  Thrombocytosis  -Likely secondary to above infection - likely playing role in septic emboli This was discussed with hematology/oncology by previous providers.  Studies were ordered including BCR-ABL 1, CML/AL L PCR quantitative.  JAK2 was also ordered.  These are pending. Ferritin was 336, iron 11, TIBC 169.  ESR 123. Platelet counts are stable for the most part.  Longstanding Crohn's disease; not chronically on steroids so less concern for adrenal insufficiency -Hold steroids/Skyrizi in the setting of ongoing infection.  Sinus tachycardia Likely multifactorial secondary to all of her acute issues including pericardial effusion.  She continues to have significant diarrhea.  Oral intake has been poor.  Could be in negative fluid balance.   She was given IV fluids over the last 48 hours.  Some improvement in heart rate is noted.  Monitor for now.  May need to give more fluids.  Will check her weight.  TSH was 4.87 earlier this month.   Type 2 diabetes -SSI PRN, has not needed much insulin currently due to poor p.o. intake -Goal BG 140-180   History of seizures -Continue PTA keppra/gabapentin   History of anxiety -Resume home Xanax, Cymbalta,  hydroxyzine   History of hypertension -Hold home antihypertensives given infection/sepsis and prior hypotension. Resume as appropriate.   History of COPD -Continue xopenex PRN -Switch PTA spiriva to yupelri per forumulary   GERD -Continue PTA PPI   Normocytic Anemia  -Transfuse for Hb <7 or hemodynamically significant bleeding Hemoglobin has been stable.   Hypokalemia/hypomagnesemia -Repleted, monitor  Cardiac arrest secondary to septic shock requiring CPR on admission Echocardiogram on admit within normal limits  Pericardial effusion without tamponade Will need 6 to 8-week follow-up with Dr. Diona Browner in the outpatient setting for repeat echo Could be contributing to her sinus tachycardia. CT angio from 02/25/2023 did not reveal any pericardial effusion.   Seizure disorder Continue Keppra   Non-insulin-dependent diabetes type 2, well-controlled  -A1c 5.8.  Monitor CBGs.  Reasonably well-controlled.  DVT prophylaxis: Lovenox Code Status:   Code Status: Full Code Family Communication: No family at bedside Disposition: Inpatient rehabilitation when stable  Consultants:  PCCM, ID, general surgery  Procedures:  Colectomy with ostomy placement  Antimicrobials:  Ceftriaxone, Flagyl, micafungin, PO vancomycin  Subjective: Patient denies any complaints this morning.  Specifically no chest pain or shortness of breath.  No nausea vomiting.  Abdominal pain is stable.  Diarrhea appears to be slowing down.  Objective: Vitals:   02/26/23 0001 02/26/23 0434 02/26/23 0900 02/26/23 0906  BP: (!) 115/49 (!) 109/53 125/73 125/73  Pulse: (!) 109 (!) 119  (!) 119  Resp: 16 15  16   Temp: 97.6 F (36.4 C) 97.8 F (36.6 C) 97.6 F (36.4 C)   TempSrc: Oral Oral Axillary   SpO2:  94%  94%  Weight:      Height:        Intake/Output Summary (  Last 24 hours) at 02/26/2023 1117 Last data filed at 02/26/2023 1044 Gross per 24 hour  Intake 978.89 ml  Output 3285 ml  Net -2306.11  ml   Filed Weights   02/20/23 0520 02/21/23 0426 02/23/23 0438  Weight: 65 kg 64.8 kg 70.7 kg    Examination:  General appearance: Awake alert.  In no distress Resp: Clear to auscultation bilaterally.  Normal effort Cardio: S1-S2 is tachycardic regular. GI: Abdomen is soft.  Wound VAC is noted colostomy is noted. No obvious focal neurological deficits.  Data Reviewed: I have personally reviewed following labs and reports of imaging studies  CBC: Recent Labs  Lab 02/22/23 0500 02/23/23 0515 02/24/23 0441 02/25/23 0529 02/26/23 0405  WBC 27.3* 28.0* 23.7* 21.5* 19.0*  NEUTROABS 20.3*  --   --   --   --   HGB 9.6* 9.3* 9.4* 9.8* 10.2*  HCT 30.0* 29.5* 29.1* 31.1* 32.1*  MCV 86.5 86.8 86.9 86.6 86.3  PLT 1,383* 1,447* 1,272* 1,253* 1,095*   Basic Metabolic Panel: Recent Labs  Lab 02/20/23 0341 02/24/23 0441 02/25/23 0529  NA 131* 131* 134*  K 4.3 4.0 4.0  CL 96* 94* 95*  CO2 19* 28 27  GLUCOSE 99 104* 113*  BUN <5* 6 6  CREATININE 0.41* 0.45 0.43*  CALCIUM 8.8* 8.9 9.3   GFR: Estimated Creatinine Clearance: 76.6 mL/min (A) (by C-G formula based on SCr of 0.43 mg/dL (L)).  Liver Function Tests: Recent Labs  Lab 02/20/23 0341 02/24/23 0441  AST 17 13*  ALT 7 7  ALKPHOS 242* 191*  BILITOT 0.6 0.3  PROT 6.0* 6.2*  ALBUMIN 2.7* 2.4*   CBG: Recent Labs  Lab 02/25/23 1217 02/25/23 1626 02/25/23 2057 02/25/23 2359 02/26/23 0432  GLUCAP 100* 124* 104* 102* 122*    Recent Results (from the past 240 hour(s))  Culture, blood (Routine X 2) w Reflex to ID Panel     Status: None   Collection Time: 02/17/23 12:42 PM   Specimen: BLOOD LEFT HAND  Result Value Ref Range Status   Specimen Description BLOOD LEFT HAND  Final   Special Requests   Final    BOTTLES DRAWN AEROBIC ONLY Blood Culture adequate volume   Culture   Final    NO GROWTH 5 DAYS Performed at [facility], [address] [place], [place] [address]    Report Status 02/22/2023 FINAL   Final  Culture, blood (Routine X 2) w Reflex to ID Panel     Status: None   Collection Time: 02/17/23 12:42 PM   Specimen: BLOOD LEFT HAND  Result Value Ref Range Status   Specimen Description BLOOD LEFT HAND  Final   Special Requests   Final    BOTTLES DRAWN AEROBIC ONLY Blood Culture adequate volume   Culture   Final    NO GROWTH 5 DAYS Performed at [facility], [address], [place], [place] [address]    Report Status 02/22/2023 FINAL  Final    Radiology Studies: CT ABDOMEN PELVIS W CONTRAST  Result Date: 02/25/2023 CLINICAL DATA:  Respiratory arrest.  Fungemia.  Pulmonary emboli. EXAM: CT ABDOMEN AND PELVIS WITH CONTRAST TECHNIQUE: Multidetector CT imaging of the abdomen and pelvis was performed using the standard protocol following bolus administration of intravenous contrast. RADIATION DOSE REDUCTION: This exam was performed according to the departmental dose-optimization program which includes automated exposure control, adjustment of the mA and/or kV according to patient size and/or use of iterative reconstruction technique. CONTRAST:  50mL OMNIPAQUE IOHEXOL 350 MG/ML SOLN COMPARISON:  02/16/2023 and overlapping portion of CTA chest from 02/25/2023 FINDINGS: Lower chest: Moderate to large left and trace right pleural effusion with associated passive atelectasis. Small pericardial effusion. Hepatobiliary: Cholecystectomy. Common bile duct about 8 mm in diameter, mild prominence likely attributable to physiologic effect from prior cholecystectomy. Pancreas: Unremarkable Spleen: Reduced size/conspicuity of previous splenic infarcts. No new infarct observed. Adrenals/Urinary Tract: There is contrast medium in the collecting systems, ureters, and urinary bladder left over from the earlier chest CT. This lowers sensitivity for nonobstructive renal calculi. Renal parenchyma appears normal bilaterally. No hydronephrosis. Stomach/Bowel: Periampullary duodenal diverticulum. There is some  dilated loops of small bowel with air-levels extending to the margin of the enterostomy, small bowel loops of to 3.8 cm in diameter. Total colectomy. Vascular/Lymphatic: Aortocaval node 0.8 cm in short axis on image 32 series 3, upper normal size. Reproductive: Uterus absent. There is dense contrast medium in the vagina, etiology uncertain, query retrograde flow from urinary incontinence versus occult vesicovaginal fistula. Other: There continues to be a small amount of complex fluid along the left paracolic gutter below the spleen, along with adjacent hazy stranding in the adipose tissues especially in the left abdomen, although slightly improved. The drain looping down into the pelvis and back up along the left paracolic gutter is not changed. Near complete resolution of prior free intraperitoneal gas with only a tiny loculation of gas in the right abdomen on image 56 of series 3, and reduced scattered fluid collections along the mesentery. Stable presacral edema. Mildly reduced mesenteric edema. Laparotomy site noted. Musculoskeletal: No hip or SI joint effusion identified. Visualized spine unremarkable. IMPRESSION: 1. Near complete resolution of prior free intraperitoneal gas with only a tiny loculation of gas in the right abdomen. 2. Reduced scattered fluid collections along the mesentery. 3. Slightly reduced hazy stranding in the adipose tissues especially in the left abdomen. 4. Urinary bladder contrast left over from earlier chest CT. There is dense contrast medium in the vagina, etiology uncertain, query retrograde flow from urinary incontinence versus occult vesicovaginal fistula. 5. Moderate to large left and trace right pleural effusions with associated passive atelectasis. 6. Small pericardial effusion. 7. Dilated loops of small bowel with air-levels extending to the margin of the enterostomy, small bowel loops of to 3.8 cm in diameter. Favor ileus over adhesion. 8. Reduced size/conspicuity of previous  splenic infarcts. Electronically Signed   By: Gaylyn Rong M.D.   On: 02/25/2023 18:39   CT Angio Chest Pulmonary Embolism (PE) W or WO Contrast  Result Date: 02/25/2023 CLINICAL DATA:  History of pulmonary emboli. EXAM: CT ANGIOGRAPHY CHEST WITH CONTRAST TECHNIQUE: Multidetector CT imaging of the chest was performed using the standard protocol during bolus administration of intravenous contrast. Multiplanar CT image reconstructions and MIPs were obtained to evaluate the vascular anatomy. RADIATION DOSE REDUCTION: This exam was performed according to the departmental dose-optimization program which includes automated exposure control, adjustment of the mA and/or kV according to patient size and/or use of iterative reconstruction technique. CONTRAST:  75mL OMNIPAQUE IOHEXOL 350 MG/ML SOLN COMPARISON:  CT scan of February 16, 2023 and February 07, 2023. FINDINGS: Cardiovascular: As noted on prior CT scan, bilateral pulmonary emboli are noted in lower lobe branches of the pulmonary arteries which are not significantly changed compared to prior exam. Normal cardiac size. No pericardial effusion. Mediastinum/Nodes: No enlarged mediastinal, hilar, or axillary lymph nodes. Thyroid gland, trachea, and esophagus demonstrate no significant findings. Lungs/Pleura: Stable moderate size left pleural  effusion is noted with associated atelectasis of left lower lobe. No pneumothorax is noted. Interval development of right lower lobe opacity concerning for pneumonia or atelectasis. Upper Abdomen: No acute abnormality. Musculoskeletal: No chest wall abnormality. No acute or significant osseous findings. Review of the MIP images confirms the above findings. IMPRESSION: Stable bilateral pulmonary emboli as noted on prior exam of February 16, 2023. Moderate size left pleural effusion is noted with associated atelectasis of left lower lobe. Interval development of right lower lobe airspace opacity concerning for pneumonia or  atelectasis. Electronically Signed   By: Lupita Raider M.D.   On: 02/25/2023 13:40        Scheduled Meds:  acetaminophen  650 mg Oral Q6H   amitriptyline  25 mg Oral QHS   Chlorhexidine Gluconate Cloth  6 each Topical Daily   diphenoxylate-atropine  1 tablet Oral BID   DULoxetine  30 mg Oral Daily   enoxaparin (LOVENOX) injection  70 mg Subcutaneous Q12H   feeding supplement  237 mL Oral TID BM   fentaNYL  1 patch Transdermal Q72H   ferrous sulfate  325 mg Oral BID WC   folic acid  1 mg Intravenous Daily   gabapentin  100 mg Oral Q8H   Gerhardt's butt cream   Topical BID   insulin aspart  0-9 Units Subcutaneous Q4H   lidocaine  2 patch Transdermal Q24H   lidocaine  20 mL Infiltration Once   loperamide  4 mg Oral QID   loratadine  10 mg Oral Daily   methocarbamol  1,000 mg Oral TID   metoprolol tartrate  12.5 mg Oral BID   multivitamin with minerals  1 tablet Oral Daily   pantoprazole (PROTONIX) IV  40 mg Intravenous Q24H   revefenacin  175 mcg Nebulization Daily   thiamine (VITAMIN B1) injection  100 mg Intravenous Daily   Continuous Infusions:  cefTRIAXone (ROCEPHIN)  IV 2 g (02/25/23 2059)   levETIRAcetam 750 mg (02/26/23 1107)   metronidazole 500 mg (02/26/23 0717)   micafungin (MYCAMINE) 150 mg in sodium chloride 0.9 % 100 mL IVPB 150 mg (02/26/23 0521)     LOS: 30 days   Osvaldo Shipper,  [facility]  If 7PM-7AM, please contact night-coverage [email or web address]  02/26/2023, 11:17 AM

## 2023-02-27 ENCOUNTER — Inpatient Hospital Stay (HOSPITAL_COMMUNITY): Payer: Medicaid Other

## 2023-02-27 DIAGNOSIS — J90 Pleural effusion, not elsewhere classified: Secondary | ICD-10-CM | POA: Diagnosis not present

## 2023-02-27 DIAGNOSIS — B49 Unspecified mycosis: Secondary | ICD-10-CM | POA: Diagnosis not present

## 2023-02-27 DIAGNOSIS — I2699 Other pulmonary embolism without acute cor pulmonale: Secondary | ICD-10-CM | POA: Diagnosis not present

## 2023-02-27 DIAGNOSIS — A0472 Enterocolitis due to Clostridium difficile, not specified as recurrent: Secondary | ICD-10-CM | POA: Diagnosis not present

## 2023-02-27 LAB — GLUCOSE, PLEURAL OR PERITONEAL FLUID: Glucose, Fluid: 101 mg/dL

## 2023-02-27 LAB — BODY FLUID CELL COUNT WITH DIFFERENTIAL
Eos, Fluid: 1 %
Lymphs, Fluid: 31 %
Monocyte-Macrophage-Serous Fluid: 16 % — ABNORMAL LOW (ref 50–90)
Neutrophil Count, Fluid: 52 % — ABNORMAL HIGH (ref 0–25)
Total Nucleated Cell Count, Fluid: 5561 uL — ABNORMAL HIGH (ref 0–1000)

## 2023-02-27 LAB — CBC
HCT: 31.5 % — ABNORMAL LOW (ref 36.0–46.0)
Hemoglobin: 10 g/dL — ABNORMAL LOW (ref 12.0–15.0)
MCH: 27.1 pg (ref 26.0–34.0)
MCHC: 31.7 g/dL (ref 30.0–36.0)
MCV: 85.4 fL (ref 80.0–100.0)
Platelets: 1064 10*3/uL (ref 150–400)
RBC: 3.69 MIL/uL — ABNORMAL LOW (ref 3.87–5.11)
RDW: 15.8 % — ABNORMAL HIGH (ref 11.5–15.5)
WBC: 14.1 10*3/uL — ABNORMAL HIGH (ref 4.0–10.5)
nRBC: 0 % (ref 0.0–0.2)

## 2023-02-27 LAB — COMPREHENSIVE METABOLIC PANEL
ALT: 7 U/L (ref 0–44)
AST: 13 U/L — ABNORMAL LOW (ref 15–41)
Albumin: 2.5 g/dL — ABNORMAL LOW (ref 3.5–5.0)
Alkaline Phosphatase: 178 U/L — ABNORMAL HIGH (ref 38–126)
Anion gap: 13 (ref 5–15)
BUN: 6 mg/dL (ref 6–20)
CO2: 27 mmol/L (ref 22–32)
Calcium: 9.6 mg/dL (ref 8.9–10.3)
Chloride: 93 mmol/L — ABNORMAL LOW (ref 98–111)
Creatinine, Ser: 0.48 mg/dL (ref 0.44–1.00)
GFR, Estimated: >60 mL/min (ref >60)
Glucose, Bld: 99 mg/dL (ref 70–99)
Potassium: 4 mmol/L (ref 3.5–5.1)
Sodium: 133 mmol/L — ABNORMAL LOW (ref 135–145)
Total Bilirubin: 0.4 mg/dL (ref 0.3–1.2)
Total Protein: 6.9 g/dL (ref 6.5–8.1)

## 2023-02-27 LAB — GLUCOSE, CAPILLARY
Glucose-Capillary: 121 mg/dL — ABNORMAL HIGH (ref 70–99)
Glucose-Capillary: 123 mg/dL — ABNORMAL HIGH (ref 70–99)
Glucose-Capillary: 77 mg/dL (ref 70–99)
Glucose-Capillary: 94 mg/dL (ref 70–99)
Glucose-Capillary: 99 mg/dL (ref 70–99)

## 2023-02-27 LAB — ALBUMIN, PLEURAL OR PERITONEAL FLUID: Albumin, Fluid: 2.1 g/dL

## 2023-02-27 LAB — GRAM STAIN

## 2023-02-27 LAB — LACTATE DEHYDROGENASE, PLEURAL OR PERITONEAL FLUID: LD, Fluid: 177 U/L — ABNORMAL HIGH (ref 3–23)

## 2023-02-27 LAB — PROTEIN, PLEURAL OR PERITONEAL FLUID: Total protein, fluid: 4.4 g/dL

## 2023-02-27 LAB — LACTATE DEHYDROGENASE: LDH: 131 U/L (ref 98–192)

## 2023-02-27 MED ORDER — LIDOCAINE HCL (PF) 1 % IJ SOLN: 8.0000 mL | Freq: Once | INTRAMUSCULAR | Status: DC

## 2023-02-27 NOTE — Progress Notes (Signed)
Post thora CXR reports tiny left lateral pneumothorax on the order of 2-3 mm.  Patient was asymptomatic while in [place].  MD/RN notified, asked to obtain STAT CT chest w/o and page the on call IR provider if patient's condition suddenly changes.   Will repeat CXR in AM.  Please call IR for questions and concerns.   Arvilla Salada Rexene Edison Pearlee Arvizu PA-C 02/27/2023 2:08 PM

## 2023-02-27 NOTE — Procedures (Signed)
PROCEDURE SUMMARY:  Successful image-guided left thoracentesis. Yielded 450 mL of hazy brown fluid. Pt tolerated procedure well. No immediate complications. EBL = trace   Specimen was  sent for labs. CXR ordered.  Please see imaging section of Epic for full dictation.  Lynann Bologna Deyvi Bonanno PA-C 02/27/2023 12:41 PM

## 2023-02-27 NOTE — Progress Notes (Addendum)
PROGRESS NOTE    [name]  [name]:[id number] DOB: 1970-10-02 DOA: 01/27/2023 PCP: [name], PA-C   Brief Narrative:  This is a 52 year old female with past medical history of COPD, Crohn's on Skyrizi, hypertension, type 2 diabetes mellitus who initially presented to the after being found unresponsive. Per family, patient has had bloody emesis with diarrhea over the past few days. Initial systolics in the 60s in the ED, and patient was minimally responsive. In the emergency room, patient was intubated, and patient was admitted to the ICU for further evaluation and management for shock and altered mental status.   01/27/2023: Admitted to ICU and intubated.  Felt to have septic shock of unclear source which resolved antibiotic therapy and fluid resuscitation.  Suspicion was that she may have had C. difficile colitis despite negative toxin.  She was treated with vancomycin. 9/29 Off pressors 10/2: Extubated 10/3 transfer to floor. 10/10 underwent repeat proctosigmoidoscopy for ongoing diarrhea.  Diffuse severe inflammation identified due to combination of Crohn's disease, active C. difficile infection and possible ischemic colitis from prior hypotension on presentation. 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  Intraoperative findings include frank necrosis with pelvic abscess formation and significant colitis. 10/12 some dilated small bowel with minimal OG output.  Continued observation advised.  Having significant postoperative pain. 10/13 became tachycardic and hypotensive.  More lethargic.  Transferred back to ICU. 10/15 blood cultures reports fungemia, resume PO vancomycin 10/17 CT abdomen pelvis notable for septic emboli - CTA chest on hold given IV access issues 10/18 TEE negative for endocarditis/bubble study - NG out for TEE probe.  Advance diet, hoping to avoid replacement of NG tube 10/19 wound VAC dysfunctional, surgery switching to wet-to-dry over the weekend  -pain poorly controlled today, add oxycodone (medication lists this is an allergy but patient verbally approved) 10/20 pain control improving, transition from IV fentanyl to Dilaudid, continue p.o. oxycodone, restart home anxiolytics with Xanax 10/21 Successful left subclavian CVC placement with IR    Assessment & Plan:   Septic shock, multifactorial, POA Fungemia, candida glabrata, likely POA Cdiff Colitis, POA Septic emboli, thrombocytosis Intra-abdominal infection -Cultures reporting fungemia, fulminant C. difficile colitis improving with concurrent intra-abdominal abscess  -ID following: micafungin, PO vancomycin, ceftriaxone, Flagyl -Status post colectomy -Left subclavian tunneled CVC placed 02/21/2023 with IR -TEE 10/18 negative for vegetations, bubble study negative Patient seen by ophthalmology on 10/23.  No evidence for fungal infiltrates on funduscopic evaluation. Patient underwent repeat CT angiogram chest as well as CT abdomen and pelvis. ID recommending continuing micafungin.  Continuing ceftriaxone and metronidazole with possible discontinuation on Monday. WBC gradually improving.  Diarrhea appears to be improving.  She is stable for the most part.   Acute pulmonary emboli/upper extremity DVT CT scan from 10/16 showed evidence for pulmonary emboli. Lower extremity Doppler studies on 10/19 did not show any DVT in the lower extremities. Doppler studies of the upper extremities on 10/19 showed acute DVT involving the right IJ and right brachial.  Left brachial DVT was also noted. Patient was initially treated with IV heparin.  Transitioned to Lovenox. Will eventually transition to oral anticoagulation depending on clinical stability. CT angio was repeated and showed stable findings with respect to the pulmonary emboli.  Moderate to severe malnutrition Postoperative short bowel syndrome/high output ostomy, improving Malabsorption likely acute on chronic NG tube remains  out.  Oral intake not quite adequate according to nutritionist.  Discussed with patient.  She does not want the nasogastric tube to be reinserted.  She  mentions that appetite is improving.  She was encouraged to do a better job of eating and drinking.  Continue to mobilize.  Infectious colitis due to C. difficile on treatment, POA -High output ostomy, continue to supplement p.o. and IV hydration as appropriate -P.o. vancomycin ongoing per ID.  Diarrhea appears to be slowing down.  Left-sided pleural effusion Thoracentesis was ordered.  When patient was taken down to radiology she decided to refuse at that time.  Discussed with her again this morning.  She understands it was a mistake to refuse it yesterday.  She is agreeable to undergo it today.  Labs have been ordered. Serum LDH is 131.  Contrast in the vagina This was seen on CT scan from 02/25/2023.  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  Discussed with Dr. Cardell Peach with urology.  He recommends outpatient evaluation including cystoscopy.  No indication for procedures in the hospital at this time.  Patient appears to be asymptomatic.  Intractable pain, multifactorial -Combination of postoperative pain, colitis, septic emboli as well as fungemia Noted to be on fentanyl patch along with as needed medications for pain.  Thrombocytosis  -Likely secondary to above infection - likely playing role in septic emboli This was discussed with hematology/oncology by previous providers.  Studies were ordered including BCR-ABL 1, CML/AL L PCR quantitative.  JAK2 was also ordered.  These are pending. Ferritin was 336, iron 11, TIBC 169.  ESR 123. Platelet counts have improved slightly.  Longstanding Crohn's disease; not chronically on steroids so less concern for adrenal insufficiency -Hold steroids/Skyrizi in the setting of ongoing infection.  Sinus tachycardia Likely multifactorial secondary to all of her acute issues including pericardial effusion.   She was also experiencing profuse diarrhea and was noted to be negative fluid balance.  She was given IV fluid boluses.  Some improvement in heart rate is noted.  Pleural effusion likely also contributing.  Continue to monitor for now. Pericardial effusion was also contributing but no pericardial effusion noted on repeat CT angiogram. TSH was 4.87 earlier this month.   Type 2 diabetes -SSI PRN, has not needed much insulin currently due to poor p.o. intake -Goal BG 140-180   History of seizures -Continue PTA keppra/gabapentin   History of anxiety -Resume home Xanax, Cymbalta, hydroxyzine   History of hypertension -Hold home antihypertensives given infection/sepsis and prior hypotension. Resume as appropriate.   History of COPD -Continue xopenex PRN -Switch PTA spiriva to yupelri per forumulary   GERD -Continue PTA PPI   Normocytic Anemia  -Transfuse for Hb <7 or hemodynamically significant bleeding Hemoglobin has been stable.   Hypokalemia/hypomagnesemia -Repleted, monitor  Cardiac arrest secondary to septic shock requiring CPR on admission Echocardiogram on admit within normal limits  Pericardial effusion without tamponade Will need 6 to 8-week follow-up with Dr. Diona Browner in the outpatient setting for repeat echo Could be contributing to her sinus tachycardia. CT angio from 02/25/2023 did not reveal any pericardial effusion.   Seizure disorder Continue Keppra   Non-insulin-dependent diabetes type 2, well-controlled  -A1c 5.8.  Monitor CBGs.  Reasonably well-controlled.  DVT prophylaxis: Lovenox Code Status:   Code Status: Full Code Family Communication: No family at bedside Disposition: Inpatient rehabilitation when stable  Consultants:  PCCM, ID, general surgery  Procedures:  Colectomy with ostomy placement  Antimicrobials:  Ceftriaxone, Flagyl, micafungin, PO vancomycin  Subjective: Denies any new complaints.  Agreeable to thoracentesis  today.  Objective: Vitals:   02/26/23 2304 02/27/23 0904 02/27/23 0911 02/27/23 0936  BP: (!) 115/57  132/62  Pulse:    (!) 113  Resp: 12     Temp: 97.6 F (36.4 C)  97.6 F (36.4 C)   TempSrc: Oral  Oral   SpO2:  99%    Weight:      Height:        Intake/Output Summary (Last 24 hours) at 02/27/2023 1056 Last data filed at 02/27/2023 0911 Gross per 24 hour  Intake 480 ml  Output 2100 ml  Net -1620 ml   Filed Weights   02/20/23 0520 02/21/23 0426 02/23/23 0438  Weight: 65 kg 64.8 kg 70.7 kg    Examination:  General appearance: Awake alert.  In no distress Resp: Clear to auscultation bilaterally.  Normal effort Cardio: S1-S2 is tachycardic regular.  No S3-S4.  No rubs murmurs or bruit GI: Wound VAC noted. No obvious focal neurological deficits.  Data Reviewed: I have personally reviewed following labs and reports of imaging studies  CBC: Recent Labs  Lab 02/22/23 0500 02/23/23 0515 02/24/23 0441 02/25/23 0529 02/26/23 0405 02/27/23 0530  WBC 27.3* 28.0* 23.7* 21.5* 19.0* 14.1*  NEUTROABS 20.3*  --   --   --   --   --   HGB 9.6* 9.3* 9.4* 9.8* 10.2* 10.0*  HCT 30.0* 29.5* 29.1* 31.1* 32.1* 31.5*  MCV 86.5 86.8 86.9 86.6 86.3 85.4  PLT 1,383* 1,447* 1,272* 1,253* 1,095* 1,064*   Basic Metabolic Panel: Recent Labs  Lab 02/24/23 0441 02/25/23 0529 02/27/23 0530  NA 131* 134* 133*  K 4.0 4.0 4.0  CL 94* 95* 93*  CO2 28 27 27   GLUCOSE 104* 113* 99  BUN 6 6 6   CREATININE 0.45 0.43* 0.48  CALCIUM 8.9 9.3 9.6   GFR: Estimated Creatinine Clearance: 76.6 mL/min (by C-G formula based on SCr of 0.48 mg/dL).  Liver Function Tests: Recent Labs  Lab 02/24/23 0441 02/27/23 0530  AST 13* 13*  ALT 7 7  ALKPHOS 191* 178*  BILITOT 0.3 0.4  PROT 6.2* 6.9  ALBUMIN 2.4* 2.5*   CBG: Recent Labs  Lab 02/26/23 1128 02/26/23 1635 02/26/23 2015 02/26/23 2302 02/27/23 0958  GLUCAP 109* 109* 101* 100* 99    Recent Results (from the past 240 hour(s))   Culture, blood (Routine X 2) w Reflex to ID Panel     Status: None   Collection Time: 02/17/23 12:42 PM   Specimen: BLOOD LEFT HAND  Result Value Ref Range Status   Specimen Description BLOOD LEFT HAND  Final   Special Requests   Final    BOTTLES DRAWN AEROBIC ONLY Blood Culture adequate volume   Culture   Final    NO GROWTH 5 DAYS Performed at [facility], [address] [place], [place] [address]    Report Status 02/22/2023 FINAL  Final  Culture, blood (Routine X 2) w Reflex to ID Panel     Status: None   Collection Time: 02/17/23 12:42 PM   Specimen: BLOOD LEFT HAND  Result Value Ref Range Status   Specimen Description BLOOD LEFT HAND  Final   Special Requests   Final    BOTTLES DRAWN AEROBIC ONLY Blood Culture adequate volume   Culture   Final    NO GROWTH 5 DAYS Performed at [facility], [address] [place], [place] [address]    Report Status 02/22/2023 FINAL  Final    Radiology Studies: [place] CHEST (PLEURAL EFFUSION)  Result Date: 02/26/2023 INDICATION: 52 year old female who developed shortness of breath, previous imaging showed left pleural  effusion. Request for therapeutic and diagnostic thoracentesis. EXAM: CHEST ULTRASOUND COMPARISON:  CTA chest dated 02/25/2023 FINDINGS: Left pleural space visualized with ultrasound which showed moderate pleural effusion. The benefits and risks of the therapeutic and diagnostic thoracentesis was discussed with the patient in detail. After thorough discussion and shared decision-making, patient declined thoracentesis. IMPRESSION: Moderate left pleural effusion. Patient declined the thoracentesis. Attending provider and nurse notified. Performed by: Lawernce Ion, PA-C Electronically Signed   By: Corlis Leak M.D.   On: 02/26/2023 15:15   CT ABDOMEN PELVIS W CONTRAST  Result Date: 02/25/2023 CLINICAL DATA:  Respiratory arrest.  Fungemia.  Pulmonary emboli. EXAM: CT ABDOMEN AND PELVIS WITH CONTRAST TECHNIQUE: Multidetector CT  imaging of the abdomen and pelvis was performed using the standard protocol following bolus administration of intravenous contrast. RADIATION DOSE REDUCTION: This exam was performed according to the departmental dose-optimization program which includes automated exposure control, adjustment of the mA and/or kV according to patient size and/or use of iterative reconstruction technique. CONTRAST:  50mL OMNIPAQUE IOHEXOL 350 MG/ML SOLN COMPARISON:  02/16/2023 and overlapping portion of CTA chest from 02/25/2023 FINDINGS: Lower chest: Moderate to large left and trace right pleural effusion with associated passive atelectasis. Small pericardial effusion. Hepatobiliary: Cholecystectomy. Common bile duct about 8 mm in diameter, mild prominence likely attributable to physiologic effect from prior cholecystectomy. Pancreas: Unremarkable Spleen: Reduced size/conspicuity of previous splenic infarcts. No new infarct observed. Adrenals/Urinary Tract: There is contrast medium in the collecting systems, ureters, and urinary bladder left over from the earlier chest CT. This lowers sensitivity for nonobstructive renal calculi. Renal parenchyma appears normal bilaterally. No hydronephrosis. Stomach/Bowel: Periampullary duodenal diverticulum. There is some dilated loops of small bowel with air-levels extending to the margin of the enterostomy, small bowel loops of to 3.8 cm in diameter. Total colectomy. Vascular/Lymphatic: Aortocaval node 0.8 cm in short axis on image 32 series 3, upper normal size. Reproductive: Uterus absent. There is dense contrast medium in the vagina, etiology uncertain, query retrograde flow from urinary incontinence versus occult vesicovaginal fistula. Other: There continues to be a small amount of complex fluid along the left paracolic gutter below the spleen, along with adjacent hazy stranding in the adipose tissues especially in the left abdomen, although slightly improved. The drain looping down into the  pelvis and back up along the left paracolic gutter is not changed. Near complete resolution of prior free intraperitoneal gas with only a tiny loculation of gas in the right abdomen on image 56 of series 3, and reduced scattered fluid collections along the mesentery. Stable presacral edema. Mildly reduced mesenteric edema. Laparotomy site noted. Musculoskeletal: No hip or SI joint effusion identified. Visualized spine unremarkable. IMPRESSION: 1. Near complete resolution of prior free intraperitoneal gas with only a tiny loculation of gas in the right abdomen. 2. Reduced scattered fluid collections along the mesentery. 3. Slightly reduced hazy stranding in the adipose tissues especially in the left abdomen. 4. Urinary bladder contrast left over from earlier chest CT. There is dense contrast medium in the vagina, etiology uncertain, query retrograde flow from urinary incontinence versus occult vesicovaginal fistula. 5. Moderate to large left and trace right pleural effusions with associated passive atelectasis. 6. Small pericardial effusion. 7. Dilated loops of small bowel with air-levels extending to the margin of the enterostomy, small bowel loops of to 3.8 cm in diameter. Favor ileus over adhesion. 8. Reduced size/conspicuity of previous splenic infarcts. Electronically Signed   By: Gaylyn Rong M.D.   On: 02/25/2023 18:39   CT  Angio Chest Pulmonary Embolism (PE) W or WO Contrast  Result Date: 02/25/2023 CLINICAL DATA:  History of pulmonary emboli. EXAM: CT ANGIOGRAPHY CHEST WITH CONTRAST TECHNIQUE: Multidetector CT imaging of the chest was performed using the standard protocol during bolus administration of intravenous contrast. Multiplanar CT image reconstructions and MIPs were obtained to evaluate the vascular anatomy. RADIATION DOSE REDUCTION: This exam was performed according to the departmental dose-optimization program which includes automated exposure control, adjustment of the mA and/or kV  according to patient size and/or use of iterative reconstruction technique. CONTRAST:  75mL OMNIPAQUE IOHEXOL 350 MG/ML SOLN COMPARISON:  CT scan of February 16, 2023 and February 07, 2023. FINDINGS: Cardiovascular: As noted on prior CT scan, bilateral pulmonary emboli are noted in lower lobe branches of the pulmonary arteries which are not significantly changed compared to prior exam. Normal cardiac size. No pericardial effusion. Mediastinum/Nodes: No enlarged mediastinal, hilar, or axillary lymph nodes. Thyroid gland, trachea, and esophagus demonstrate no significant findings. Lungs/Pleura: Stable moderate size left pleural effusion is noted with associated atelectasis of left lower lobe. No pneumothorax is noted. Interval development of right lower lobe opacity concerning for pneumonia or atelectasis. Upper Abdomen: No acute abnormality. Musculoskeletal: No chest wall abnormality. No acute or significant osseous findings. Review of the MIP images confirms the above findings. IMPRESSION: Stable bilateral pulmonary emboli as noted on prior exam of February 16, 2023. Moderate size left pleural effusion is noted with associated atelectasis of left lower lobe. Interval development of right lower lobe airspace opacity concerning for pneumonia or atelectasis. Electronically Signed   By: Lupita Raider M.D.   On: 02/25/2023 13:40        Scheduled Meds:  acetaminophen  650 mg Oral Q6H   amitriptyline  25 mg Oral QHS   Chlorhexidine Gluconate Cloth  6 each Topical Daily   diphenoxylate-atropine  1 tablet Oral BID   DULoxetine  30 mg Oral Daily   enoxaparin (LOVENOX) injection  70 mg Subcutaneous Q12H   feeding supplement  237 mL Oral TID BM   fentaNYL  1 patch Transdermal Q72H   ferrous sulfate  325 mg Oral BID WC   folic acid  1 mg Intravenous Daily   gabapentin  100 mg Oral Q8H   Gerhardt's butt cream   Topical BID   insulin aspart  0-9 Units Subcutaneous Q4H   lidocaine  2 patch Transdermal Q24H    lidocaine  20 mL Infiltration Once   loperamide  4 mg Oral QID   loratadine  10 mg Oral Daily   methocarbamol  1,000 mg Oral TID   metoprolol tartrate  12.5 mg Oral BID   multivitamin with minerals  1 tablet Oral Daily   pantoprazole (PROTONIX) IV  40 mg Intravenous Q24H   revefenacin  175 mcg Nebulization Daily   thiamine (VITAMIN B1) injection  100 mg Intravenous Daily   Continuous Infusions:  cefTRIAXone (ROCEPHIN)  IV 2 g (02/26/23 2145)   levETIRAcetam 750 mg (02/27/23 0947)   metronidazole 500 mg (02/27/23 0654)   micafungin (MYCAMINE) 150 mg in sodium chloride 0.9 % 100 mL IVPB 150 mg (02/27/23 0517)     LOS: 31 days   Osvaldo Shipper,  [facility]  If 7PM-7AM, please contact night-coverage [email or web address]  02/27/2023, 10:56 AM

## 2023-02-28 ENCOUNTER — Inpatient Hospital Stay (HOSPITAL_COMMUNITY): Payer: Medicaid Other

## 2023-02-28 DIAGNOSIS — A0471 Enterocolitis due to Clostridium difficile, recurrent: Secondary | ICD-10-CM | POA: Diagnosis not present

## 2023-02-28 DIAGNOSIS — J90 Pleural effusion, not elsewhere classified: Secondary | ICD-10-CM | POA: Diagnosis not present

## 2023-02-28 DIAGNOSIS — B49 Unspecified mycosis: Secondary | ICD-10-CM | POA: Diagnosis not present

## 2023-02-28 DIAGNOSIS — A0472 Enterocolitis due to Clostridium difficile, not specified as recurrent: Secondary | ICD-10-CM | POA: Diagnosis not present

## 2023-02-28 DIAGNOSIS — B379 Candidiasis, unspecified: Secondary | ICD-10-CM | POA: Diagnosis not present

## 2023-02-28 DIAGNOSIS — I2699 Other pulmonary embolism without acute cor pulmonale: Secondary | ICD-10-CM | POA: Diagnosis not present

## 2023-02-28 LAB — GLUCOSE, CAPILLARY
Glucose-Capillary: 101 mg/dL — ABNORMAL HIGH (ref 70–99)
Glucose-Capillary: 108 mg/dL — ABNORMAL HIGH (ref 70–99)
Glucose-Capillary: 109 mg/dL — ABNORMAL HIGH (ref 70–99)
Glucose-Capillary: 111 mg/dL — ABNORMAL HIGH (ref 70–99)
Glucose-Capillary: 126 mg/dL — ABNORMAL HIGH (ref 70–99)
Glucose-Capillary: 99 mg/dL (ref 70–99)

## 2023-02-28 LAB — CYTOLOGY - NON PAP

## 2023-02-28 LAB — CBC
HCT: 33.2 % — ABNORMAL LOW (ref 36.0–46.0)
Hemoglobin: 10.5 g/dL — ABNORMAL LOW (ref 12.0–15.0)
MCH: 27.4 pg (ref 26.0–34.0)
MCHC: 31.6 g/dL (ref 30.0–36.0)
MCV: 86.7 fL (ref 80.0–100.0)
Platelets: 1039 10*3/uL (ref 150–400)
RBC: 3.83 MIL/uL — ABNORMAL LOW (ref 3.87–5.11)
RDW: 15.5 % (ref 11.5–15.5)
WBC: 16.8 10*3/uL — ABNORMAL HIGH (ref 4.0–10.5)
nRBC: 0 % (ref 0.0–0.2)

## 2023-02-28 MED ORDER — PROCHLORPERAZINE EDISYLATE 10 MG/2ML IJ SOLN
10.0000 mg | Freq: Once | INTRAMUSCULAR | Status: AC
  Administered 2023-02-28: 10 mg via INTRAVENOUS
  Filled 2023-02-28: qty 2

## 2023-02-28 NOTE — Progress Notes (Signed)
PROGRESS NOTE    [name]  BMW:[id number] DOB: 04/02/1971 DOA: 01/27/2023 PCP: [name], PA-C   Brief Narrative:  This is a 52 year old female with past medical history of COPD, Crohn's on Skyrizi, hypertension, type 2 diabetes mellitus who initially presented to the after being found unresponsive. Per family, patient has had bloody emesis with diarrhea over the past few days. Initial systolics in the 60s in the ED, and patient was minimally responsive. In the emergency room, patient was intubated, and patient was admitted to the ICU for further evaluation and management for shock and altered mental status.   01/27/2023: Admitted to ICU and intubated.  Felt to have septic shock of unclear source which resolved antibiotic therapy and fluid resuscitation.  Suspicion was that she may have had C. difficile colitis despite negative toxin.  She was treated with vancomycin. 9/29 Off pressors 10/2: Extubated 10/3 transfer to floor. 10/10 underwent repeat proctosigmoidoscopy for ongoing diarrhea.  Diffuse severe inflammation identified due to combination of Crohn's disease, active C. difficile infection and possible ischemic colitis from prior hypotension on presentation. 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  Intraoperative findings include frank necrosis with pelvic abscess formation and significant colitis. 10/12 some dilated small bowel with minimal OG output.  Continued observation advised.  Having significant postoperative pain. 10/13 became tachycardic and hypotensive.  More lethargic.  Transferred back to ICU. 10/15 blood cultures reports fungemia, resume PO vancomycin 10/17 CT abdomen pelvis notable for septic emboli - CTA chest on hold given IV access issues 10/18 TEE negative for endocarditis/bubble study - NG out for TEE probe.  Advance diet, hoping to avoid replacement of NG tube 10/19 wound VAC dysfunctional, surgery switching to wet-to-dry over the weekend  -pain poorly controlled today, add oxycodone (medication lists this is an allergy but patient verbally approved) 10/20 pain control improving, transition from IV fentanyl to Dilaudid, continue p.o. oxycodone, restart home anxiolytics with Xanax 10/21 Successful left subclavian CVC placement with IR    Assessment & Plan:   Septic shock, multifactorial, POA Fungemia, candida glabrata, likely POA Cdiff Colitis, POA Septic emboli, thrombocytosis Intra-abdominal infection -Cultures reporting fungemia, fulminant C. difficile colitis improving with concurrent intra-abdominal abscess  -ID following: micafungin, PO vancomycin, ceftriaxone, Flagyl -Status post colectomy -Left subclavian tunneled CVC placed 02/21/2023 with IR -TEE 10/18 negative for vegetations, bubble study negative Patient seen by ophthalmology on 10/23.  No evidence for fungal infiltrates on funduscopic evaluation. Patient underwent repeat CT angiogram chest as well as CT abdomen and pelvis. ID recommending continuing micafungin.  Continuing ceftriaxone and metronidazole with possible discontinuation on 10/28. WBC gradually improving.  Diarrhea appears to be improving.  She is stable for the most part.   Acute pulmonary emboli/upper extremity DVT CT scan from 10/16 showed evidence for pulmonary emboli. Lower extremity Doppler studies on 10/19 did not show any DVT in the lower extremities. Doppler studies of the upper extremities on 10/19 showed acute DVT involving the right IJ and right brachial.  Left brachial DVT was also noted. Patient was initially treated with IV heparin.  Transitioned to Lovenox. Will eventually transition to oral anticoagulation depending on clinical stability. CT angio was repeated and showed stable findings with respect to the pulmonary emboli.  Moderate to severe malnutrition Postoperative short bowel syndrome/high output ostomy, improving Malabsorption likely acute on chronic NG tube remains out.   Oral intake not quite adequate according to nutritionist.  Discussed with patient.  She does not want the nasogastric tube to be reinserted.  She  mentions that appetite is improving.  She was encouraged to do a better job of eating and drinking.  Continue to mobilize.  Infectious colitis due to C. difficile on treatment, POA -High output ostomy, continue to supplement p.o. and IV hydration as appropriate -P.o. vancomycin ongoing per ID.  Diarrhea appears to be slowing down.  Left-sided pleural effusion/iatrogenic pneumothorax, small Underwent thoracentesis yesterday.  Gram stain without any organisms.  LDH is 177.  Serum LDH is 131. Total protein 4.4.  Albumin 2.1.  Total cell count is 5561.  Based on lights criteria this is an exudative sample.  Likely related to all of her acute medical issues as mentioned above.  Wait on cultures.  Wait on cytology. A small pneumothorax was noted after procedure yesterday.  Chest x-ray from this morning is pending.  She is experiencing some pain in the left side.  Pain medications can be given.    Contrast in the vagina This was seen on CT scan from 02/25/2023.  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  Discussed with Dr. Cardell Peach with urology.  He recommends outpatient evaluation including cystoscopy.  No indication for procedures in the hospital at this time.  Patient appears to be asymptomatic.  Intractable pain, multifactorial -Combination of postoperative pain, colitis, septic emboli as well as fungemia Noted to be on fentanyl patch along with as needed medications for pain. Reasonably well-controlled.  Thrombocytosis  -Likely secondary to above infection - likely playing role in septic emboli This was discussed with hematology/oncology by previous providers.  Studies were ordered including BCR-ABL 1 , CML/AL L PCR quantitative which was reported as being negative.  JAK2 was also ordered and is pending.  Ferritin was 336, iron 11, TIBC 169.  ESR  123. Platelet counts have improved slightly. Will need outpatient follow-up with hematology.  Longstanding Crohn's disease; not chronically on steroids so less concern for adrenal insufficiency -Hold steroids/Skyrizi in the setting of ongoing infection.  Sinus tachycardia Likely multifactorial secondary to all of her acute issues including pericardial effusion.  She was also experiencing profuse diarrhea and was noted to be negative fluid balance.  She was given IV fluid boluses.  Some improvement in heart rate is noted.   Pleural fluid as well as pericardial effusion were contributing.  Pleural fluid was tapped yesterday.  Recent CT angiogram did not show a pericardial effusion.   TSH was 4.87 earlier this month. Pain and anxiety likely contributing to elevated heart rate.   Type 2 diabetes -SSI PRN, has not needed much insulin currently due to poor p.o. intake -Goal BG 140-180   History of seizures -Continue PTA keppra/gabapentin   History of anxiety -Resume home Xanax, Cymbalta, hydroxyzine   History of hypertension Currently just on metoprolol.   History of COPD -Continue xopenex PRN -Switch PTA spiriva to yupelri per forumulary   GERD -Continue PTA PPI   Normocytic Anemia  -Transfuse for Hb <7 or hemodynamically significant bleeding Hemoglobin has been stable.   Hypokalemia/hypomagnesemia -Repleted, monitor  Cardiac arrest secondary to septic shock requiring CPR on admission Echocardiogram on admit within normal limits  Pericardial effusion without tamponade Will need 6 to 8-week follow-up with Dr. Diona Browner in the outpatient setting for repeat echo Could be contributing to her sinus tachycardia. CT angio from 02/25/2023 did not reveal any pericardial effusion.   Seizure disorder Continue Keppra   Non-insulin-dependent diabetes type 2, well-controlled  -A1c 5.8.  Monitor CBGs.  Reasonably well-controlled.  DVT prophylaxis: Lovenox Code Status:   Code Status:  Full  Code Family Communication: No family at bedside Disposition: Inpatient rehabilitation when stable  Consultants:  PCCM, ID, general surgery  Procedures:  Colectomy with ostomy placement  Antimicrobials:  Ceftriaxone, Flagyl, micafungin, PO vancomycin  Subjective: Complains of pain at the thoracentesis site.  Objective: Vitals:   02/28/23 0406 02/28/23 0500 02/28/23 0836 02/28/23 0842  BP: (!) 138/93   135/79  Pulse: (!) 116     Resp: 18   19  Temp: 98.2 F (36.8 C)   97.8 F (36.6 C)  TempSrc: Oral   Oral  SpO2: 99%  99% 99%  Weight:  66 kg    Height:        Intake/Output Summary (Last 24 hours) at 02/28/2023 0905 Last data filed at 02/28/2023 0641 Gross per 24 hour  Intake 932.06 ml  Output 2815 ml  Net -1882.94 ml   Filed Weights   02/21/23 0426 02/23/23 0438 02/28/23 0500  Weight: 64.8 kg 70.7 kg 66 kg    Examination:  General appearance: Awake alert.  In no distress Resp: Clear to auscultation bilaterally.  Normal effort.  No swelling or bleeding noted in the left thoracentesis site. Cardio: S1-S2 is tachycardic regular.  No S3-S4.  No rubs murmurs or bruit GI: Abdomen is soft.  Nontender nondistended.  Bowel sounds are present normal.  No masses organomegaly   Data Reviewed: I have personally reviewed following labs and reports of imaging studies  CBC: Recent Labs  Lab 02/22/23 0500 02/23/23 0515 02/24/23 0441 02/25/23 0529 02/26/23 0405 02/27/23 0530 02/28/23 0645  WBC 27.3*   < > 23.7* 21.5* 19.0* 14.1* 16.8*  NEUTROABS 20.3*  --   --   --   --   --   --   HGB 9.6*   < > 9.4* 9.8* 10.2* 10.0* 10.5*  HCT 30.0*   < > 29.1* 31.1* 32.1* 31.5* 33.2*  MCV 86.5   < > 86.9 86.6 86.3 85.4 86.7  PLT 1,383*   < > 1,272* 1,253* 1,095* 1,064* 1,039*   < > = values in this interval not displayed.   Basic Metabolic Panel: Recent Labs  Lab 02/24/23 0441 02/25/23 0529 02/27/23 0530  NA 131* 134* 133*  K 4.0 4.0 4.0  CL 94* 95* 93*  CO2 28 27  27   GLUCOSE 104* 113* 99  BUN 6 6 6   CREATININE 0.45 0.43* 0.48  CALCIUM 8.9 9.3 9.6   GFR: Estimated Creatinine Clearance: 74.2 mL/min (by C-G formula based on SCr of 0.48 mg/dL).  Liver Function Tests: Recent Labs  Lab 02/24/23 0441 02/27/23 0530  AST 13* 13*  ALT 7 7  ALKPHOS 191* 178*  BILITOT 0.3 0.4  PROT 6.2* 6.9  ALBUMIN 2.4* 2.5*   CBG: Recent Labs  Lab 02/27/23 1551 02/27/23 2058 02/27/23 2357 02/28/23 0404 02/28/23 0846  GLUCAP 77 94 121* 101* 111*    Recent Results (from the past 240 hour(s))  Culture, body fluid w Gram Stain-bottle     Status: None (Preliminary result)   Collection Time: 02/27/23  1:00 PM   Specimen: Pleura  Result Value Ref Range Status   Specimen Description PLEURAL  Final   Special Requests LEFT  Final   Culture   Final    NO GROWTH < 24 HOURS Performed at [facility], [address], [place], [place] [address]    Report Status PENDING  Incomplete  Gram stain     Status: None   Collection Time: 02/27/23  1:00 PM  Specimen: Pleura  Result Value Ref Range Status   Specimen Description PLEURAL  Final   Special Requests LEFT  Final   Gram Stain   Final    FEW WBC SEEN NO ORGANISMS SEEN Performed at [facility], [address], [place], [place] [address]    Report Status 02/27/2023 FINAL  Final    Radiology Studies: US THORACENTESIS ASP PLEURAL SPACE W/IMG GUIDE  Result Date: 02/28/2023 INDICATION: 52 year old female infectious colitis presents with shortness of breath, previous imaging showed left pleural effusion. Request for therapeutic and diagnostic thoracentesis. EXAM: ULTRASOUND GUIDED LEFT THORACENTESIS MEDICATIONS: 8 mL 1% lidocaine COMPLICATIONS: SIR Level A - No therapy, no consequence. PROCEDURE: An ultrasound guided thoracentesis was thoroughly discussed with the patient and questions answered. The benefits, risks, alternatives and complications were also discussed. The patient understands and wishes  to proceed with the procedure. Written consent was obtained. Ultrasound was performed to localize and mark an adequate pocket of fluid in the left chest. The area was then prepped and draped in the normal sterile fashion. 1% Lidocaine was used for local anesthesia. Under ultrasound guidance a 6 Fr Safe-T-Centesis catheter was introduced. Thoracentesis was performed. The catheter was removed and a dressing applied. FINDINGS: A total of approximately 450 mL of hazy brown fluid was removed. Samples were sent to the laboratory as requested by the clinical team. Post procedure chest x-ray showed tiny left lateral pneumothorax overlying the left mid and left lower lung. IMPRESSION: Successful ultrasound guided left thoracentesis yielding 450 mL of pleural fluid. Post procedure chest x-ray showed a tiny left lateral pneumothorax overlying the left mid and left lower lung. Patient was asymptomatic while she was in the ultrasound department. Attending provider and RN was notified, for repeat chest x-ray in a.m. Performed by: Lawernce Ion, PA-C Electronically Signed   By: Corlis Leak M.D.   On: 02/28/2023 07:24   DG Chest 1 View  Result Date: 02/27/2023 CLINICAL DATA:  Status post left thoracentesis. EXAM: CHEST  1 VIEW COMPARISON:  02/25/2023 FINDINGS: There is a left subclavian central venous catheter with tip in the distal SVC. Heart size is normal. Decreased volume of left pleural effusion status post thoracentesis. Tiny left lateral pneumothorax is identified on the order of 2-3 mm overlying the left mid and left lower lung. Opacity within the left base compatible with atelectasis or consolidation. Right lung appears clear. IMPRESSION: 1. Decreased volume of left pleural effusion status post thoracentesis. 2. Tiny left lateral pneumothorax on the order of 2-3 mm. 3. Left base atelectasis or consolidation. Electronically Signed   By: Signa Kell M.D.   On: 02/27/2023 13:55   [place] CHEST (PLEURAL EFFUSION)  Result  Date: 02/26/2023 INDICATION: 52 year old female who developed shortness of breath, previous imaging showed left pleural effusion. Request for therapeutic and diagnostic thoracentesis. EXAM: CHEST ULTRASOUND COMPARISON:  CTA chest dated 02/25/2023 FINDINGS: Left pleural space visualized with ultrasound which showed moderate pleural effusion. The benefits and risks of the therapeutic and diagnostic thoracentesis was discussed with the patient in detail. After thorough discussion and shared decision-making, patient declined thoracentesis. IMPRESSION: Moderate left pleural effusion. Patient declined the thoracentesis. Attending provider and nurse notified. Performed by: Lawernce Ion, PA-C Electronically Signed   By: Corlis Leak M.D.   On: 02/26/2023 15:15        Scheduled Meds:  acetaminophen  650 mg Oral Q6H   amitriptyline  25 mg Oral QHS   Chlorhexidine Gluconate Cloth  6 each Topical Daily  diphenoxylate-atropine  1 tablet Oral BID   DULoxetine  30 mg Oral Daily   enoxaparin (LOVENOX) injection  70 mg Subcutaneous Q12H   feeding supplement  237 mL Oral TID BM   fentaNYL  1 patch Transdermal Q72H   ferrous sulfate  325 mg Oral BID WC   folic acid  1 mg Intravenous Daily   gabapentin  100 mg Oral Q8H   Gerhardt's butt cream   Topical BID   insulin aspart  0-9 Units Subcutaneous Q4H   lidocaine  2 patch Transdermal Q24H   lidocaine (PF)  8 mL Intradermal Once   loperamide  4 mg Oral QID   loratadine  10 mg Oral Daily   methocarbamol  1,000 mg Oral TID   metoprolol tartrate  12.5 mg Oral BID   multivitamin with minerals  1 tablet Oral Daily   pantoprazole (PROTONIX) IV  40 mg Intravenous Q24H   revefenacin  175 mcg Nebulization Daily   thiamine (VITAMIN B1) injection  100 mg Intravenous Daily   Continuous Infusions:  levETIRAcetam 750 mg (02/27/23 2233)   micafungin (MYCAMINE) 150 mg in sodium chloride 0.9 % 100 mL IVPB 150 mg (02/28/23 0459)     LOS: 32 days   Osvaldo Shipper,  [facility]  If 7PM-7AM, please contact night-coverage [email or web address]  02/28/2023, 9:05 AM

## 2023-02-28 NOTE — Consult Note (Signed)
WOC Nurse wound follow up Wound type: full thickness surgical midline  Measurement: 21 cm x 1.5 cm x 1.8 cm  Wound bed: beefy red, small amount of tan slough at top portion  Drainage (amount, consistency, odor) moderate serosanguinous  Periwound: intact, ileostomy to RLQ that is difficult to change NPWT without also changing ileostomy  Dressing procedure/placement/frequency: Removed old NPWT dressing; cut a barrier ring in half and placed 1/2 at umbilicus to enhance seal  Filled wound with  1 piece of black foam  Sealed NPWT dressing at HG Patient received IV pain medication per bedside nurse prior to dressing change Patient tolerated procedure fair   WOC nurse will continue to provide NPWT dressing changes due to the complexity of the dressing change, next due Thursday 03/03/2023.  1 small VAC kit in room.    WOC Nurse ostomy follow up Stoma type/location: RLQ ileostomy  Stomal assessment/size: 1 1/4" pink moist; some mucosal separation from 9 to 11 o'clock as previously noted  Peristomal assessment: midline incision to left which makes pouching difficult unless changing NPWT  Treatment options for stomal/peristomal skin:  cut silver hydrofiber and placed in area of mucosal separation, covered with 2" barrier ring  Output approximately 200 mls dark liquid stool  Ostomy pouching: 1pc convex  Education provided: patient continues to show no interest in learning care of ileostomy although we have discussed the importance of this.  She now says she may be discharged Friday.  I told her if she is going home she has to learn to take care of her ileostomy as it will need to be emptied several times a day and pouching changed 2 times a week or if leaking.  Patient minimally acknowledges any discussion regarding ostomy.   Enrolled patient in [facility] [employer] Discharge program: Yes previously   WOC will continue to follow for ostomy education and support.    Thank you,     Priscella Mann MSN, RN-BC, [employer] [phone]

## 2023-02-28 NOTE — Plan of Care (Signed)

## 2023-02-28 NOTE — Progress Notes (Signed)
PT Cancellation Note  Patient Details Name: [name] MRN: [id number] DOB: 03/10/1971   Cancelled Treatment:    Reason Eval/Treat Not Completed: (P) Fatigue/lethargy limiting ability to participate (Pt reporting fatigue and pain from thoracentesis site. Pt politely requests therapy to return tomorrow. Will follow per PT POC.)   [name] 02/28/2023, 4:31 PM

## 2023-03-01 DIAGNOSIS — A0472 Enterocolitis due to Clostridium difficile, not specified as recurrent: Secondary | ICD-10-CM | POA: Diagnosis not present

## 2023-03-01 DIAGNOSIS — J90 Pleural effusion, not elsewhere classified: Secondary | ICD-10-CM | POA: Diagnosis not present

## 2023-03-01 DIAGNOSIS — I2699 Other pulmonary embolism without acute cor pulmonale: Secondary | ICD-10-CM | POA: Diagnosis not present

## 2023-03-01 DIAGNOSIS — B49 Unspecified mycosis: Secondary | ICD-10-CM | POA: Diagnosis not present

## 2023-03-01 LAB — GLUCOSE, CAPILLARY
Glucose-Capillary: 103 mg/dL — ABNORMAL HIGH (ref 70–99)
Glucose-Capillary: 109 mg/dL — ABNORMAL HIGH (ref 70–99)
Glucose-Capillary: 115 mg/dL — ABNORMAL HIGH (ref 70–99)
Glucose-Capillary: 115 mg/dL — ABNORMAL HIGH (ref 70–99)
Glucose-Capillary: 120 mg/dL — ABNORMAL HIGH (ref 70–99)
Glucose-Capillary: 106 mg/dL — ABNORMAL HIGH (ref 70–99)

## 2023-03-01 LAB — JAK2 V617F RFX CALR/MPL/E12-15

## 2023-03-01 LAB — CALR +MPL + E12-E15  (REFLEX)

## 2023-03-01 LAB — CBC
HCT: 32.5 % — ABNORMAL LOW (ref 36.0–46.0)
Hemoglobin: 10.2 g/dL — ABNORMAL LOW (ref 12.0–15.0)
MCH: 27 pg (ref 26.0–34.0)
MCHC: 31.4 g/dL (ref 30.0–36.0)
MCV: 86 fL (ref 80.0–100.0)
Platelets: 937 10*3/uL (ref 150–400)
RBC: 3.78 MIL/uL — ABNORMAL LOW (ref 3.87–5.11)
RDW: 15.2 % (ref 11.5–15.5)
WBC: 15.1 10*3/uL — ABNORMAL HIGH (ref 4.0–10.5)
nRBC: 0 % (ref 0.0–0.2)

## 2023-03-01 MED ORDER — METOPROLOL TARTRATE 5 MG/5ML IV SOLN
2.5000 mg | INTRAVENOUS | Status: DC | PRN
  Administered 2023-03-01 – 2023-03-09 (×2): 2.5 mg via INTRAVENOUS
  Filled 2023-03-01 (×3): qty 5

## 2023-03-01 MED ORDER — LEVETIRACETAM 750 MG PO TABS
750.0000 mg | ORAL_TABLET | Freq: Two times a day (BID) | ORAL | Status: DC
  Administered 2023-03-01 – 2023-03-19 (×36): 750 mg via ORAL
  Filled 2023-03-01 (×37): qty 1

## 2023-03-01 MED ORDER — PANTOPRAZOLE SODIUM 40 MG PO TBEC
40.0000 mg | DELAYED_RELEASE_TABLET | Freq: Every day | ORAL | Status: DC
  Administered 2023-03-01 – 2023-03-19 (×19): 40 mg via ORAL
  Filled 2023-03-01 (×19): qty 1

## 2023-03-01 MED ORDER — THIAMINE MONONITRATE 100 MG PO TABS
100.0000 mg | ORAL_TABLET | Freq: Every day | ORAL | Status: DC
  Administered 2023-03-02 – 2023-03-19 (×18): 100 mg via ORAL
  Filled 2023-03-01 (×18): qty 1

## 2023-03-01 MED ORDER — VANCOMYCIN HCL 125 MG PO CAPS
125.0000 mg | ORAL_CAPSULE | Freq: Two times a day (BID) | ORAL | Status: AC
  Administered 2023-03-06 – 2023-03-12 (×14): 125 mg via ORAL
  Filled 2023-03-01 (×14): qty 1

## 2023-03-01 MED ORDER — METOPROLOL TARTRATE 25 MG PO TABS
25.0000 mg | ORAL_TABLET | Freq: Two times a day (BID) | ORAL | Status: DC
  Administered 2023-03-01: 25 mg via ORAL
  Filled 2023-03-01: qty 1

## 2023-03-01 MED ORDER — VANCOMYCIN HCL 125 MG PO CAPS
125.0000 mg | ORAL_CAPSULE | Freq: Every day | ORAL | Status: AC
  Administered 2023-03-13 – 2023-03-19 (×7): 125 mg via ORAL
  Filled 2023-03-01 (×7): qty 1

## 2023-03-01 MED ORDER — FOLIC ACID 1 MG PO TABS
1.0000 mg | ORAL_TABLET | Freq: Every day | ORAL | Status: DC
  Administered 2023-03-02 – 2023-03-19 (×18): 1 mg via ORAL
  Filled 2023-03-01 (×18): qty 1

## 2023-03-01 MED ORDER — VANCOMYCIN HCL 125 MG PO CAPS: 125.0000 mg | ORAL_CAPSULE | ORAL | Status: DC

## 2023-03-01 MED ORDER — VANCOMYCIN HCL 125 MG PO CAPS
125.0000 mg | ORAL_CAPSULE | Freq: Four times a day (QID) | ORAL | Status: AC
  Administered 2023-03-01 – 2023-03-05 (×20): 125 mg via ORAL
  Filled 2023-03-01 (×20): qty 1

## 2023-03-01 MED ORDER — GABAPENTIN 100 MG PO CAPS
100.0000 mg | ORAL_CAPSULE | Freq: Three times a day (TID) | ORAL | Status: DC
  Administered 2023-03-01 – 2023-03-19 (×54): 100 mg via ORAL
  Filled 2023-03-01 (×54): qty 1

## 2023-03-01 NOTE — Plan of Care (Signed)

## 2023-03-01 NOTE — Progress Notes (Signed)
Issue with Pyxis in smaller medication room on 4E. Medication Dilaudid 0.5mg  was pulled at 2024 and given at 2029. See MAR for charted medication. Pyxis is showing that medication needs to be wasted but her ordered dose is for the entire vial, so none needed to be wasted. Jimmy from pharmacy was notified and I will put a comment in the Pyxis documentation. Judeth Cornfield RN charge nurse notified as well. The other Pyxis on the unit is not showing that anything needs to be wasted. Trevonte Ashkar S Jonatan Wilsey

## 2023-03-01 NOTE — Progress Notes (Addendum)
[facility]  Date of Admission:  01/27/2023     Principal Problem:   Fungemia Active Problems:   Shock (HCC)   Metabolic encephalopathy   Cardiac arrest (HCC)   Acute respiratory failure with hypoxia (HCC)   Upper GI bleed   Granulomatous colitis (HCC)   Pseudomembranous colitis   Crohn's disease without complication (HCC)   Ileus (HCC)   C. 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  TEE did not show vegetations.  Repeat blood cultures on 10/17 remain negative. - Central line placed on 10/21 -Upper extremity and lower extremity Dopplers are negative #Intrabdominal infection Repeat CT is reassuring few locules of free air in abdomen likely postoperative.  Continue ceftriaxone and metronidazole for now -CTA chest on 10/25 showed stable b/l embolic, RLL opacity. No respiratory complaints and on RA. -Optho note no fungal infiltrates on DFE -Repeat CT AP on 10/25 noted near complete resolution of intracranial gas only a tiny loculation of gas in right abdomen.  Reduce scattered fluid collection along mesentery.  Slightly reduced hazy stranding in adipose tissues.  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  Dilated small bowel and air levels extending to margin of enterostomy favoring ileus orientations.  Reduced size conspicuity of previous splenic infarcts.  #C diff illietis - On 9/29 C. difficile antigen and PCR positive, toxin negative -Diarrhea is improving.  Continue p.o. vancomycin for now  Recommendations: -D/C ceftriaxone and metronidazole(pt has  been on since 10/11) as CT improved form intraabdominal infection perspective -Continue micafungin, follow-up with Candida glabrata sensitivities.  Needs to be seen by ophthalmology or neuro in setting of candidemia.  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 Reginia Forts) recommends OP eval with cystoscopy Microbiology:   Antibiotics: Ceftriaxone, metronidazole, micafungin, p.o. vancomycin  SUBJECTIVE: Resting in bed.  Reports diarrhea is improving. Interval: Afebrile overnight, WBC 15K  Review of Systems: Review of Systems  All other systems reviewed and are negative.    Scheduled Meds:  acetaminophen  650 mg Oral Q6H   amitriptyline  25 mg Oral QHS   Chlorhexidine Gluconate Cloth  6 each Topical Daily   diphenoxylate-atropine  1 tablet Oral BID   DULoxetine  30 mg Oral Daily   enoxaparin (LOVENOX) injection  70 mg Subcutaneous Q12H   feeding supplement  237 mL Oral TID BM   fentaNYL  1 patch Transdermal Q72H   ferrous sulfate  325 mg Oral BID WC   folic acid  1 mg Intravenous Daily   gabapentin  100 mg Oral Q8H   Gerhardt's butt cream   Topical BID   insulin aspart  0-9 Units Subcutaneous Q4H   lidocaine  2 patch Transdermal Q24H   lidocaine (PF)  8 mL Intradermal Once   loperamide  4 mg Oral QID   loratadine  10 mg Oral Daily  methocarbamol  1,000 mg Oral TID   metoprolol tartrate  12.5 mg Oral BID   multivitamin with minerals  1 tablet Oral Daily   pantoprazole (PROTONIX) IV  40 mg Intravenous Q24H   revefenacin  175 mcg Nebulization Daily   thiamine (VITAMIN B1) injection  100 mg Intravenous Daily   Continuous Infusions:  levETIRAcetam 750 mg (02/28/23 2358)   micafungin (MYCAMINE) 150 mg in sodium chloride 0.9 % 100 mL IVPB 150 mg (03/01/23 0440)    PRN Meds:.ALPRAZolam, bismuth subsalicylate, camphor-menthol, dextrose, HYDROmorphone (DILAUDID) injection, levalbuterol, magic mouthwash, metoprolol tartrate, ondansetron (ZOFRAN) IV, mouth rinse, oxyCODONE Allergies  Allergen Reactions   Codeine Shortness Of Breath, Swelling and Rash    Throat swelling   Hydrocodone Shortness Of Breath, Swelling and Rash   Ketorolac Nausea And Vomiting and Swelling    Pt reports n/v and swelling to throat   Morphine And Codeine Shortness Of Breath, Swelling and Rash   Penicillins Shortness Of Breath, Swelling and Other (See Comments)    Throat swells 02/06/21--TOLERATES CEFTRIAXONE     Nickel Rash   Pregabalin Other (See Comments)   Acetaminophen     Per MD patient states she can't take Tylenol because of liver enzymes   Atarax [Hydroxyzine] Itching and Swelling    Mildly swollen throat   Ativan [Lorazepam] Other (See Comments)    Migraines.   Strawberry Extract Swelling   Watermelon Concentrate Swelling   Albuterol Palpitations   Benadryl [Diphenhydramine Hcl] Palpitations   Humira [Adalimumab] Rash   Latex Rash   Remicade [Infliximab] Rash    Blisters and Welts    Tramadol Rash    Rash and itching    OBJECTIVE: Vitals:   02/28/23 1607 02/28/23 1929 02/28/23 2351 03/01/23 0431  BP: (!) 114/52 (!) 119/59 (!) 117/46 (!) 120/46  Pulse: (!) 111 (!) 128 (!) 115 (!) 118  Resp: 13 12 13 13   Temp: 97.8 F (36.6 C) 97.6 F (36.4 C) 97.7 F (36.5 C) 98.2 F (36.8 C)  TempSrc: Oral Oral Oral Oral  SpO2:  98% 99% 99%  Weight:      Height:       Body mass index is 25.78 kg/m.  Physical Exam Constitutional:      Appearance: Normal appearance.  HENT:     Head: Normocephalic and atraumatic.     Right Ear: Tympanic membrane normal.     Left Ear: Tympanic membrane normal.     Nose: Nose normal.     Mouth/Throat:     Mouth: Mucous membranes are moist.  Eyes:     Extraocular Movements: Extraocular movements intact.      Conjunctiva/sclera: Conjunctivae normal.     Pupils: Pupils are equal, round, and reactive to light.  Cardiovascular:     Rate and Rhythm: Normal rate and regular rhythm.     Heart sounds: No murmur heard.    No friction rub. No gallop.  Pulmonary:     Effort: Pulmonary effort is normal.     Breath sounds: Normal breath sounds.  Musculoskeletal:        General: Normal range of motion.  Skin:    General: Skin is warm and dry.  Neurological:     General: No focal deficit present.     Mental Status: She is alert and oriented to person, place, and time.  Psychiatric:        Mood and Affect: Mood normal.   RUQ colostomy LLQ drain    Lab Results Lab Results  Component Value Date   WBC 15.1 (H) 03/01/2023   HGB 10.2 (L) 03/01/2023   HCT 32.5 (L) 03/01/2023   MCV 86.0 03/01/2023   PLT 937 (HH) 03/01/2023    Lab Results  Component Value Date   CREATININE 0.48 02/27/2023   BUN 6 02/27/2023   NA 133 (L) 02/27/2023   K 4.0 02/27/2023   CL 93 (L) 02/27/2023   CO2 27 02/27/2023    Lab Results  Component Value Date   ALT 7 02/27/2023   AST 13 (L) 02/27/2023   ALKPHOS 178 (H) 02/27/2023   BILITOT 0.4 02/27/2023        Danelle Earthly, MD [facility] 03/01/2023, 5:31 AM I have personally spent 5e minutes involved in face-to-face and non-face-to-face activities for this patient on the day of the visit. Professional time spent includes the following activities: Preparing to see the patient (review of tests), Obtaining and/or reviewing separately obtained history (admission/discharge record), Performing a medically appropriate examination and/or evaluation , Ordering medications/tests/procedures, referring and communicating with other health care professionals, Documenting clinical information in the EMR, Independently interpreting results (not separately reported), Communicating results to the patient/family/caregiver, Counseling  and educating the patient/family/caregiver and Care coordination (not separately reported).

## 2023-03-01 NOTE — Plan of Care (Signed)
  Problem: Pain Managment: Goal: General experience of comfort will improve Outcome: Progressing   Problem: Safety: Goal: Ability to remain free from injury will improve Outcome: Progressing   Problem: Skin Integrity: Goal: Risk for impaired skin integrity will decrease Outcome: Progressing   

## 2023-03-01 NOTE — Progress Notes (Signed)
Occupational Therapy Treatment Patient Details Name: [name] MRN: [id number] DOB: [birth year] Today's Date: 03/01/2023   History of present illness 52 y.o. female presents to [facility] hospital on 01/27/2023 after being found unresponsive at home down in a pool of bloody vomit/stool. CPR was initiated upon arrival to ED, pt intubated and started on pressors. Pt undergoing management for inflammatory vs infections colitis. Pt extubated on 10/2. 10/10 underwent repeat proctosigmoidoscopy for ongoing diarrhea.  Diffuse severe inflammation identified due to combination of Crohn's disease, active C. difficile infection and possible ischemic colitis from prior hypotension on presentation. 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 10/13 became tachycardic and hypotensive.  More lethargic.  Transferred back to ICU. PMH includes COPD, Crohn's disease, HTN, DMII.   OT comments  Pt progressing slowly toward established OT goals secondary to elevated HR and poor activity tolerance. Pt needing significantly increased time during mobility between positional changes. BP stable but HR up to 130s EOB and 140s standing. Will continue to follow and progress a pt tolerates.       If plan is discharge home, recommend the following:  Assistance with cooking/housework;Direct supervision/assist for medications management;Direct supervision/assist for financial management;Assist for transportation;Help with stairs or ramp for entrance;A little help with walking and/or transfers;A lot of help with bathing/dressing/bathroom   Equipment Recommendations  Other (comment) (defer next venue)    Recommendations for Other Services      Precautions / Restrictions Precautions Precautions: Fall;Other (comment) Precaution Comments: watch HR/RR, bulb drain L, new colostomy, abdominal wound vac Restrictions Weight Bearing Restrictions: No       Mobility Bed Mobility Overal bed mobility: Needs Assistance Bed  Mobility: Rolling, Sidelying to Sit, Sit to Supine Rolling: Contact guard assist, Used rails Sidelying to sit: Contact guard assist, HOB elevated, Used rails   Sit to supine: Supervision   General bed mobility comments: assit for line management    Transfers Overall transfer level: Needs assistance Equipment used: Rolling walker (2 wheels) Transfers: Sit to/from Stand Sit to Stand: Mod assist     Step pivot transfers: Min assist     General transfer comment: for rise and steadting with 1 person HHA     Balance Overall balance assessment: Needs assistance Sitting-balance support: Feet supported, Bilateral upper extremity supported Sitting balance-Leahy Scale: Fair Sitting balance - Comments: sitting EOB                                   ADL either performed or assessed with clinical judgement   ADL Overall ADL's : Needs assistance/impaired Eating/Feeding: Set up;Sitting Eating/Feeding Details (indicate cue type and reason): EOB without back support during session Grooming: Oral care;Sitting;Set up               Lower Body Dressing: Contact guard assist;Sitting/lateral leans Lower Body Dressing Details (indicate cue type and reason): to don socks. Toilet Transfer: Minimal assistance;Stand-pivot                  Extremity/Trunk Assessment Upper Extremity Assessment Upper Extremity Assessment: Generalized weakness   Lower Extremity Assessment Lower Extremity Assessment: Defer to PT evaluation        Vision       Perception     Praxis      Cognition Arousal: Alert Behavior During Therapy: [facility] for tasks assessed/performed Overall Cognitive Status: Within Functional Limits for tasks assessed  General Comments: Seems to have fair cognition; able to report accurately for orientation questions, etc        Exercises      Shoulder Instructions       General Comments Session limited by  HR and pt needing close monitoring and significantly increased time between activities and positional changes. Pt HR elevated to 110 on arrival, but RN encouraged to see secondary to pt tachy for the past 24 hours. Pt HR up to 136b at EOB. Encouraged deep breathing throughout to attempt to calm HR, but limited improvement. Pt with good technique and SpO2 WFL throughout. HR up to 140 with transfer. RN aware. Cessation of session for pt safety    Pertinent Vitals/ Pain       Pain Assessment Pain Assessment: 0-10 Pain Score: 9  Pain Location: abdomen Pain Descriptors / Indicators: Discomfort, Grimacing, Guarding Pain Intervention(s): Limited activity within patient's tolerance, Monitored during session, RN gave pain meds during session  Home Living                                          Prior Functioning/Environment              Frequency  Min 1X/week        Progress Toward Goals  OT Goals(current goals can now be found in the care plan section)  Progress towards OT goals: Progressing toward goals  Acute Rehab OT Goals Patient Stated Goal: get better OT Goal Formulation: With patient Time For Goal Achievement: 03/15/23 Potential to Achieve Goals: Good  Plan      Co-evaluation                 AM-PAC OT "6 Clicks" Daily Activity     Outcome Measure   Help from another person eating meals?: A Little Help from another person taking care of personal grooming?: A Little Help from another person toileting, which includes using toliet, bedpan, or urinal?: Total Help from another person bathing (including washing, rinsing, drying)?: A Lot Help from another person to put on and taking off regular upper body clothing?: A Little Help from another person to put on and taking off regular lower body clothing?: Total 6 Click Score: 13    End of Session    OT Visit Diagnosis: Unsteadiness on feet (R26.81);Other abnormalities of gait and mobility  (R26.89);Muscle weakness (generalized) (M62.81);Other symptoms and signs involving cognitive function   Activity Tolerance Patient tolerated treatment well;Other (comment) (limited secondary to elevated HR and need for close monitoring)   Patient Left in bed;with call bell/phone within reach;with bed alarm set;with nursing/sitter in room   Nurse Communication Other (comment) (RN gave pain meds during session)        Time: 1005-1031 OT Time Calculation (min): 26 min  Charges: OT General Charges $OT Visit: 1 Visit OT Treatments $Self Care/Home Management : 8-22 mins $Therapeutic Activity: 8-22 mins  [name], OTD, OTR/L [facility] Acute Rehabilitation Office: [phone]   [name] 03/01/2023, 11:50 AM

## 2023-03-01 NOTE — Progress Notes (Signed)
PROGRESS NOTE    [name]  NWG:[id number] DOB: [birth year] DOA: 01/27/2023 PCP: [name], PA-C   Brief Narrative:  This is a 52 year old female with past medical history of COPD, Crohn's on Skyrizi, hypertension, type 2 diabetes mellitus who initially presented to the after being found unresponsive. Per family, patient has had bloody emesis with diarrhea over the past few days. Initial systolics in the 60s in the ED, and patient was minimally responsive. In the emergency room, patient was intubated, and patient was admitted to the ICU for further evaluation and management for shock and altered mental status.   01/27/2023: Admitted to ICU and intubated.  Felt to have septic shock of unclear source which resolved antibiotic therapy and fluid resuscitation.  Suspicion was that she may have had C. difficile colitis despite negative toxin.  She was treated with vancomycin. 9/29 Off pressors 10/2: Extubated 10/3 transfer to floor. 10/10 underwent repeat proctosigmoidoscopy for ongoing diarrhea.  Diffuse severe inflammation identified due to combination of Crohn's disease, active C. difficile infection and possible ischemic colitis from prior hypotension on presentation. 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  Intraoperative findings include frank necrosis with pelvic abscess formation and significant colitis. 10/12 some dilated small bowel with minimal OG output.  Continued observation advised.  Having significant postoperative pain. 10/13 became tachycardic and hypotensive.  More lethargic.  Transferred back to ICU. 10/15 blood cultures reports fungemia, resume PO vancomycin 10/17 CT abdomen pelvis notable for septic emboli - CTA chest on hold given IV access issues 10/18 TEE negative for endocarditis/bubble study - NG out for TEE probe.  Advance diet, hoping to avoid replacement of NG tube 10/19 wound VAC dysfunctional, surgery switching to wet-to-dry over the weekend  -pain poorly controlled today, add oxycodone (medication lists this is an allergy but patient verbally approved) 10/20 pain control improving, transition from IV fentanyl to Dilaudid, continue p.o. oxycodone, restart home anxiolytics with Xanax 10/21 Successful left subclavian CVC placement with IR    Assessment & Plan:   Septic shock, multifactorial, POA Fungemia, candida glabrata, likely POA Cdiff Colitis, POA Septic emboli, thrombocytosis Intra-abdominal infection -Cultures reporting fungemia, fulminant C. difficile colitis improving with concurrent intra-abdominal abscess  -ID following: micafungin, PO vancomycin, ceftriaxone, Flagyl -Status post colectomy -Left subclavian tunneled CVC placed 02/21/2023 with IR -TEE 10/18 negative for vegetations, bubble study negative Patient seen by ophthalmology on 10/23.  No evidence for fungal infiltrates on funduscopic evaluation. Patient underwent repeat CT angiogram chest as well as CT abdomen and pelvis. ID recommending continuing micafungin.  Considering a 6-week treatment.  Wait for final ID input.  Ceftriaxone and metronidazole was discontinued on 10/28 WBC gradually improving.  She remains afebrile.  Diarrhea is improving.   Acute pulmonary emboli/upper extremity DVT CT scan from 10/16 showed evidence for pulmonary emboli. Lower extremity Doppler studies on 10/19 did not show any DVT in the lower extremities. Doppler studies of the upper extremities on 10/19 showed acute DVT involving the right IJ and right brachial.  Left brachial DVT was also noted. Patient was initially treated with IV heparin.  Transitioned to Lovenox. Will eventually transition to oral anticoagulation depending on clinical stability. CT angio was repeated and showed stable findings with respect to the pulmonary emboli.  Moderate to severe malnutrition Postoperative short bowel syndrome/high output ostomy, improving Malabsorption likely acute on chronic NG tube  remains out.  Oral intake not quite adequate according to nutritionist.  Discussed with patient.  She does not want the nasogastric tube to be  reinserted.  She mentions that appetite is improving.  She was encouraged to do a better job of eating and drinking.  Continue to mobilize.  Infectious colitis due to C. difficile on treatment, POA -High output ostomy, continue to supplement p.o. and IV hydration as appropriate -P.o. vancomycin ongoing per ID.  Diarrhea appears to be slowing down.  Left-sided pleural effusion/iatrogenic pneumothorax, small Underwent thoracentesis yesterday.  Gram stain without any organisms.  LDH is 177.  Serum LDH is 131. Total protein 4.4.  Albumin 2.1.  Total cell count is 5561.  Based on lights criteria this is an exudative sample.  Likely related to all of her acute medical issues as mentioned above.  Wait on cultures.  Wait on cytology. A small pneumothorax was noted after procedure.  Chest x-ray was repeated on 10/28 which showed does not reveal any pneumothorax.    Contrast in the vagina This was seen on CT scan from 02/25/2023.  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  Discussed with Dr. Cardell Peach with urology.  He recommends outpatient evaluation including cystoscopy.  No indication for procedures in the hospital at this time.  Patient appears to be asymptomatic.  Intractable pain, multifactorial Combination of postoperative pain, colitis, septic emboli as well as fungemia Noted to be on fentanyl patch along with as needed medications for pain. Reasonably well-controlled.  Thrombocytosis  Likely secondary to above infection - likely playing role in septic emboli This was discussed with hematology/oncology by previous providers.  Studies were ordered including BCR-ABL 1 , CML/AL L PCR quantitative which was reported as being negative.  JAK2 was also ordered and is pending.  Ferritin was 336, iron 11, TIBC 169.  ESR 123. Platelet counts continue to improve.  Might  have been an acute phase reactant. May need outpatient follow-up with hematology if counts do not return to normal.  Longstanding Crohn's disease; not chronically on steroids so less concern for adrenal insufficiency -Hold steroids/Skyrizi in the setting of ongoing infection.  Sinus tachycardia Likely multifactorial secondary to all of her acute issues including pericardial effusion.  She was also experiencing profuse diarrhea and was noted to be negative fluid balance.  She was given IV fluid boluses.  Some improvement in heart rate is noted.   Pleural fluid as well as pericardial effusion were contributing.  Recent CT angiogram did not show a pericardial effusion.   TSH was 4.87 earlier this month. Pain and anxiety likely contributing to elevated heart rate. Blood pressure is stable.  Could consider increasing the dose of her metoprolol.   Type 2 diabetes -SSI PRN, has not needed much insulin currently due to poor p.o. intake -Goal BG 140-180   History of seizures -Continue PTA keppra/gabapentin   History of anxiety -Resume home Xanax, Cymbalta, hydroxyzine   History of hypertension Currently just on metoprolol.   History of COPD -Continue xopenex PRN -Switch PTA spiriva to yupelri per forumulary   GERD -Continue PTA PPI   Normocytic Anemia  -Transfuse for Hb <7 or hemodynamically significant bleeding Hemoglobin has been stable.   Hypokalemia/hypomagnesemia -Repleted, monitor  Cardiac arrest secondary to septic shock requiring CPR on admission Echocardiogram on admit within normal limits  Pericardial effusion without tamponade Initially seen on echocardiogram. CT angio from 02/25/2023 did not reveal any pericardial effusion.  May need outpatient echocardiogram to confirm resolution.   Seizure disorder Continue Keppra   Non-insulin-dependent diabetes type 2, well-controlled  -A1c 5.8.  Monitor CBGs.  Reasonably well-controlled.  DVT prophylaxis: Lovenox Code  Status:  Code Status: Full Code Family Communication: No family at bedside Disposition: Inpatient rehabilitation when stable  Consultants:  PCCM, ID, general surgery  Procedures:  Colectomy with ostomy placement  Antimicrobials:  Ceftriaxone, Flagyl, micafungin, PO vancomycin  Subjective: No complaints offered this morning.  She is wondering about the next steps with respect to rehab etc.  Objective: Vitals:   02/28/23 2351 03/01/23 0431 03/01/23 0820 03/01/23 0841  BP: (!) 117/46 (!) 120/46  121/64  Pulse: (!) 115 (!) 118    Resp: 13 13  14   Temp: 97.7 F (36.5 C) 98.2 F (36.8 C)  98 F (36.7 C)  TempSrc: Oral Oral  Oral  SpO2: 99% 99% 98%   Weight:      Height:        Intake/Output Summary (Last 24 hours) at 03/01/2023 0955 Last data filed at 02/28/2023 2358 Gross per 24 hour  Intake 400 ml  Output 2415 ml  Net -2015 ml   Filed Weights   02/21/23 0426 02/23/23 0438 02/28/23 0500  Weight: 64.8 kg 70.7 kg 66 kg    Examination:  General appearance: Awake alert.  In no distress Resp: Clear to auscultation bilaterally.  Normal effort Cardio: S1-S2 is tachycardic regular.  No S3-S4.  No rubs murmurs or bruit GI: Abdomen is soft.  Nontender nondistended.  Bowel sounds are present normal.  No masses organomegaly Extremities: Moving all of her extremities   Data Reviewed: I have personally reviewed following labs and reports of imaging studies  CBC: Recent Labs  Lab 02/25/23 0529 02/26/23 0405 02/27/23 0530 02/28/23 0645 03/01/23 0445  WBC 21.5* 19.0* 14.1* 16.8* 15.1*  HGB 9.8* 10.2* 10.0* 10.5* 10.2*  HCT 31.1* 32.1* 31.5* 33.2* 32.5*  MCV 86.6 86.3 85.4 86.7 86.0  PLT 1,253* 1,095* 1,064* 1,039* 937*   Basic Metabolic Panel: Recent Labs  Lab 02/24/23 0441 02/25/23 0529 02/27/23 0530  NA 131* 134* 133*  K 4.0 4.0 4.0  CL 94* 95* 93*  CO2 28 27 27   GLUCOSE 104* 113* 99  BUN 6 6 6   CREATININE 0.45 0.43* 0.48  CALCIUM 8.9 9.3 9.6    GFR: Estimated Creatinine Clearance: 74.2 mL/min (by C-G formula based on SCr of 0.48 mg/dL).  Liver Function Tests: Recent Labs  Lab 02/24/23 0441 02/27/23 0530  AST 13* 13*  ALT 7 7  ALKPHOS 191* 178*  BILITOT 0.3 0.4  PROT 6.2* 6.9  ALBUMIN 2.4* 2.5*   CBG: Recent Labs  Lab 02/28/23 1610 02/28/23 1928 02/28/23 2348 03/01/23 0430 03/01/23 0843  GLUCAP 126* 109* 99 103* 109*    Recent Results (from the past 240 hour(s))  Culture, body fluid w Gram Stain-bottle     Status: None (Preliminary result)   Collection Time: 02/27/23  1:00 PM   Specimen: Pleura  Result Value Ref Range Status   Specimen Description PLEURAL  Final   Special Requests LEFT  Final   Culture   Final    NO GROWTH < 24 HOURS Performed at [facility], [address], [place], [place] [address]    Report Status PENDING  Incomplete  Gram stain     Status: None   Collection Time: 02/27/23  1:00 PM   Specimen: Pleura  Result Value Ref Range Status   Specimen Description PLEURAL  Final   Special Requests LEFT  Final   Gram Stain   Final    FEW WBC SEEN NO ORGANISMS SEEN Performed at [facility], [address]  [place], [place] [address]    Report Status 02/27/2023 FINAL  Final    Radiology Studies: DG CHEST PORT 1 VIEW  Result Date: 02/28/2023 CLINICAL DATA:  142230 Pleural effusion 142230. EXAM: PORTABLE CHEST 1 VIEW COMPARISON:  Chest radiograph 02/27/2023. FINDINGS: Unchanged left subclavian approach central venous catheter with tip projecting over the mid SVC. Unchanged consolidative opacity in the right lower lung. Unchanged small residual left pleural effusion with adjacent atelectasis in the left lung base. No pneumothorax. IMPRESSION: 1. Unchanged consolidative opacity in the right lower lung. 2. Unchanged small residual left pleural effusion with adjacent atelectasis in the left lung base. Electronically Signed   By: Orvan Falconer M.D.   On: 02/28/2023 11:43   US  THORACENTESIS ASP PLEURAL SPACE W/IMG GUIDE  Result Date: 02/28/2023 INDICATION: 52 year old female infectious colitis presents with shortness of breath, previous imaging showed left pleural effusion. Request for therapeutic and diagnostic thoracentesis. EXAM: ULTRASOUND GUIDED LEFT THORACENTESIS MEDICATIONS: 8 mL 1% lidocaine COMPLICATIONS: SIR Level A - No therapy, no consequence. PROCEDURE: An ultrasound guided thoracentesis was thoroughly discussed with the patient and questions answered. The benefits, risks, alternatives and complications were also discussed. The patient understands and wishes to proceed with the procedure. Written consent was obtained. Ultrasound was performed to localize and mark an adequate pocket of fluid in the left chest. The area was then prepped and draped in the normal sterile fashion. 1% Lidocaine was used for local anesthesia. Under ultrasound guidance a 6 Fr Safe-T-Centesis catheter was introduced. Thoracentesis was performed. The catheter was removed and a dressing applied. FINDINGS: A total of approximately 450 mL of hazy brown fluid was removed. Samples were sent to the laboratory as requested by the clinical team. Post procedure chest x-ray showed tiny left lateral pneumothorax overlying the left mid and left lower lung. IMPRESSION: Successful ultrasound guided left thoracentesis yielding 450 mL of pleural fluid. Post procedure chest x-ray showed a tiny left lateral pneumothorax overlying the left mid and left lower lung. Patient was asymptomatic while she was in the ultrasound department. Attending provider and RN was notified, for repeat chest x-ray in a.m. Performed by: Lawernce Ion, PA-C Electronically Signed   By: Corlis Leak M.D.   On: 02/28/2023 07:24   DG Chest 1 View  Result Date: 02/27/2023 CLINICAL DATA:  Status post left thoracentesis. EXAM: CHEST  1 VIEW COMPARISON:  02/25/2023 FINDINGS: There is a left subclavian central venous catheter with tip in the distal  SVC. Heart size is normal. Decreased volume of left pleural effusion status post thoracentesis. Tiny left lateral pneumothorax is identified on the order of 2-3 mm overlying the left mid and left lower lung. Opacity within the left base compatible with atelectasis or consolidation. Right lung appears clear. IMPRESSION: 1. Decreased volume of left pleural effusion status post thoracentesis. 2. Tiny left lateral pneumothorax on the order of 2-3 mm. 3. Left base atelectasis or consolidation. Electronically Signed   By: Signa Kell M.D.   On: 02/27/2023 13:55        Scheduled Meds:  acetaminophen  650 mg Oral Q6H   amitriptyline  25 mg Oral QHS   Chlorhexidine Gluconate Cloth  6 each Topical Daily   diphenoxylate-atropine  1 tablet Oral BID   DULoxetine  30 mg Oral Daily   enoxaparin (LOVENOX) injection  70 mg Subcutaneous Q12H   feeding supplement  237 mL Oral TID BM   fentaNYL  1 patch Transdermal Q72H   ferrous sulfate  325 mg Oral BID WC  folic acid  1 mg Intravenous Daily   gabapentin  100 mg Oral Q8H   Gerhardt's butt cream   Topical BID   insulin aspart  0-9 Units Subcutaneous Q4H   lidocaine  2 patch Transdermal Q24H   lidocaine (PF)  8 mL Intradermal Once   loperamide  4 mg Oral QID   loratadine  10 mg Oral Daily   methocarbamol  1,000 mg Oral TID   metoprolol tartrate  12.5 mg Oral BID   multivitamin with minerals  1 tablet Oral Daily   pantoprazole (PROTONIX) IV  40 mg Intravenous Q24H   revefenacin  175 mcg Nebulization Daily   thiamine (VITAMIN B1) injection  100 mg Intravenous Daily   vancomycin  125 mg Oral QID   Followed by   Melene Muller ON 03/15/2023] vancomycin  125 mg Oral BID   Followed by   Melene Muller ON 03/22/2023] vancomycin  125 mg Oral Daily   Followed by   Melene Muller ON 03/29/2023] vancomycin  125 mg Oral QODAY   Followed by   Melene Muller ON 04/06/2023] vancomycin  125 mg Oral Q3 days   Continuous Infusions:  levETIRAcetam 750 mg (02/28/23 2358)   micafungin  (MYCAMINE) 150 mg in sodium chloride 0.9 % 100 mL IVPB 150 mg (03/01/23 0440)     LOS: 33 days   Osvaldo Shipper,  [facility]  If 7PM-7AM, please contact night-coverage [email or web address]  03/01/2023, 9:55 AM

## 2023-03-01 NOTE — Plan of Care (Signed)
  Problem: Education: Goal: Knowledge of General Education information will improve Description: Including pain rating scale, medication(s)/side effects and non-pharmacologic comfort measures Outcome: Progressing   Problem: Clinical Measurements: Goal: Respiratory complications will improve Outcome: Progressing   Problem: Nutrition: Goal: Adequate nutrition will be maintained Outcome: Progressing   Problem: Safety: Goal: Ability to remain free from injury will improve Outcome: Progressing   Problem: Coping: Goal: Level of anxiety will decrease Outcome: Not Progressing   Problem: Skin Integrity: Goal: Risk for impaired skin integrity will decrease Outcome: Not Progressing

## 2023-03-01 NOTE — Progress Notes (Signed)
Nutrition Follow-up  DOCUMENTATION CODES:   Not applicable  INTERVENTION:  Continue Ensure Plus High Protein po TID, each supplement provides 350 kcal and 20 grams of protein. MVI with minerals daily Encourage family to provide food from outside hospital to optimize nutritional intake Educated on the importance of adequate nutritional intake to promote optimal healing and maintenance of fat/muscle stores  NUTRITION DIAGNOSIS:   Inadequate oral intake related to inability to eat as evidenced by NPO status. - progressing  GOAL:   Patient will meet greater than or equal to 90% of their needs - progressing  MONITOR:   Diet advancement, Skin, Labs, Weight trends, TF tolerance  REASON FOR ASSESSMENT:   Consult Assessment of nutrition requirement/status  ASSESSMENT:   52 year old female with past medical history of COPD, Crohn's on Skyrizi, hypertension, type 2 diabetes mellitus who initially presented to the after being found unresponsive.  10/27: s/p thoracentesis- yielfd ; developed L pneumothorax  Spoke with pt at bedside. She reports that she continues to eat the same types of foods for most meals however she feels that these are "safe" foods given her h/o crohn's. She has started eating pot roast with her meals-lunch and dinner. She eats most of the mashed potatoes and about 50% of the pot roast. When vanilla Ensure is available she will consume 2-3 per day but may back off a bit when she is feeling bloated. She has been consuming multiple apple juices daily but unable to quantify. Pt tried carnation instant breakfast but did not enjoy these supplements. Attempted to gather preferences for protein containing snacks to offer between meals however pt declines all offerings.  Pt states that she is eating more here than she typically does at home. Expressed importance of increasing nutritional intake whether with meals, snacks, supplements or smaller, more frequent po intake  throughout the day in order to enhance recovery and prevent muscle/fat deficits. Pt is understanding. Encouraged her to have family bring in any meals/snacks that she may feel she can tolerate.   Meal completions: 10/24: 50% breakfast 10/25: 5% lunch 10/26: 0% breakfast, 25% lunch 10/29: 25% lunch  Admit weight: 68.9 kg Current weight: 66.8 kg  Medications: lomotil BID, ferrous sulfate, folvite, SSI 0-9 units q4h, imodium 4mg  QID, MVI, protonix, thiamine, IV abx, IV mycamine  Labs: CBG's 99-115 x24 hours  No documented output via wound VAC x24 hours  Diet Order:   Diet Order             Diet regular Room service appropriate? Yes; Fluid consistency: Thin  Diet effective now                   EDUCATION NEEDS:   Not appropriate for education at this time  Skin:  Skin Assessment: Skin Integrity Issues: Skin Integrity Issues:: Stage II, Wound VAC, Other (Comment) Stage II: lip (ETT holder) Wound Vac: abdomen (closed surgical incision) Other: MASD to perineum  Last BM:  x7 hours via ileostomy  Height:   Ht Readings from Last 1 Encounters:  02/02/23 5' 2.99" (1.6 m)    Weight:   Wt Readings from Last 1 Encounters:  03/01/23 66.8 kg    Ideal Body Weight:  52.3 kg  BMI:  Body mass index is 26.1 kg/m.  Estimated Nutritional Needs:   Kcal:  1700-1900 kcal/d  Protein:  85-100 g/d  Fluid:  >1.8L/d  Drusilla Kanner, RDN, LDN Clinical Nutrition

## 2023-03-01 NOTE — Progress Notes (Signed)
Mobility Specialist Progress Note:   03/01/23 1436  Mobility  Activity Stood at bedside;Ambulated with assistance in room  Level of Assistance Contact guard assist, steadying assist  Assistive Device Front wheel walker  Distance Ambulated (ft) 5 ft  Activity Response Tolerated fair  Mobility Referral Yes  $Mobility charge 1 Mobility  Mobility Specialist Start Time (ACUTE ONLY) 1330  Mobility Specialist Stop Time (ACUTE ONLY) 1350  Mobility Specialist Time Calculation (min) (ACUTE ONLY) 20 min   Pre Mobility: 135 HR  During Mobility: 146 HR  Post Mobility: 132 HR  Pt received in bed, agreeable to mobility. C/o dizziness when transitioning to EOB. Rated 8/10. Pt able to stand and take a couple steps from bedside with CG for safety. HR peaked at 146 bpm during ambulation. Pt returned to supine position with call bell in reach and all needs met. RN notified.   Leory Plowman  Mobility Specialist Please contact via [employer] office at [phone]

## 2023-03-02 DIAGNOSIS — A0472 Enterocolitis due to Clostridium difficile, not specified as recurrent: Secondary | ICD-10-CM | POA: Diagnosis not present

## 2023-03-02 DIAGNOSIS — D62 Acute posthemorrhagic anemia: Secondary | ICD-10-CM | POA: Diagnosis not present

## 2023-03-02 DIAGNOSIS — B49 Unspecified mycosis: Secondary | ICD-10-CM | POA: Diagnosis not present

## 2023-03-02 DIAGNOSIS — I469 Cardiac arrest, cause unspecified: Secondary | ICD-10-CM | POA: Diagnosis not present

## 2023-03-02 LAB — COMPREHENSIVE METABOLIC PANEL
ALT: 8 U/L (ref 0–44)
AST: 15 U/L (ref 15–41)
Albumin: 2.6 g/dL — ABNORMAL LOW (ref 3.5–5.0)
Alkaline Phosphatase: 151 U/L — ABNORMAL HIGH (ref 38–126)
Anion gap: 11 (ref 5–15)
BUN: 7 mg/dL (ref 6–20)
CO2: 26 mmol/L (ref 22–32)
Calcium: 9.6 mg/dL (ref 8.9–10.3)
Chloride: 94 mmol/L — ABNORMAL LOW (ref 98–111)
Creatinine, Ser: 0.61 mg/dL (ref 0.44–1.00)
GFR, Estimated: >60 mL/min (ref >60)
Glucose, Bld: 108 mg/dL — ABNORMAL HIGH (ref 70–99)
Potassium: 4.2 mmol/L (ref 3.5–5.1)
Sodium: 131 mmol/L — ABNORMAL LOW (ref 135–145)
Total Bilirubin: 0.5 mg/dL (ref 0.3–1.2)
Total Protein: 7 g/dL (ref 6.5–8.1)

## 2023-03-02 LAB — GLUCOSE, CAPILLARY
Glucose-Capillary: 106 mg/dL — ABNORMAL HIGH (ref 70–99)
Glucose-Capillary: 108 mg/dL — ABNORMAL HIGH (ref 70–99)
Glucose-Capillary: 119 mg/dL — ABNORMAL HIGH (ref 70–99)
Glucose-Capillary: 126 mg/dL — ABNORMAL HIGH (ref 70–99)
Glucose-Capillary: 147 mg/dL — ABNORMAL HIGH (ref 70–99)
Glucose-Capillary: 92 mg/dL (ref 70–99)

## 2023-03-02 LAB — CBC
HCT: 31.8 % — ABNORMAL LOW (ref 36.0–46.0)
Hemoglobin: 10.1 g/dL — ABNORMAL LOW (ref 12.0–15.0)
MCH: 27.2 pg (ref 26.0–34.0)
MCHC: 31.8 g/dL (ref 30.0–36.0)
MCV: 85.5 fL (ref 80.0–100.0)
Platelets: 969 10*3/uL (ref 150–400)
RBC: 3.72 MIL/uL — ABNORMAL LOW (ref 3.87–5.11)
RDW: 14.9 % (ref 11.5–15.5)
WBC: 12 10*3/uL — ABNORMAL HIGH (ref 4.0–10.5)
nRBC: 0 % (ref 0.0–0.2)

## 2023-03-02 MED ORDER — METOPROLOL TARTRATE 25 MG PO TABS
37.5000 mg | ORAL_TABLET | Freq: Two times a day (BID) | ORAL | Status: DC
  Administered 2023-03-02 – 2023-03-03 (×4): 37.5 mg via ORAL
  Filled 2023-03-02 (×5): qty 1

## 2023-03-02 NOTE — Plan of Care (Signed)
  Problem: Education: Goal: Knowledge of General Education information will improve Description: Including pain rating scale, medication(s)/side effects and non-pharmacologic comfort measures Outcome: Progressing   Problem: Health Behavior/Discharge Planning: Goal: Ability to manage health-related needs will improve Outcome: Progressing   Problem: Clinical Measurements: Goal: Ability to maintain clinical measurements within normal limits will improve Outcome: Progressing Goal: Diagnostic test results will improve Outcome: Progressing Goal: Respiratory complications will improve Outcome: Progressing Goal: Cardiovascular complication will be avoided Outcome: Progressing   Problem: Activity: Goal: Risk for activity intolerance will decrease Outcome: Progressing   Problem: Nutrition: Goal: Adequate nutrition will be maintained Outcome: Progressing   

## 2023-03-02 NOTE — Progress Notes (Signed)
PROGRESS NOTE  [name] KGM:[id number] DOB: 03-11-1971   PCP: [name], PA-C  Patient is from: Home  DOA: 01/27/2023 LOS: 34  Chief complaints Chief Complaint  Patient presents with   Respiratory Arrest     Brief Narrative / Interim history: 52 year old female with past medical history of COPD, Crohn's on Skyrizi, hypertension, type 2 diabetes mellitus who initially presented to the after being found unresponsive. Per family, patient has had bloody emesis with diarrhea over the past few days. Initial systolics in the 60s in the ED, and patient was minimally responsive. In the emergency room, patient was intubated, and patient was admitted to the ICU for further evaluation and management for shock and altered mental status.    01/27/2023: Admitted to ICU and intubated.  Felt to have septic shock of unclear source which resolved antibiotic therapy and fluid resuscitation.  Suspicion was that she may have had C. difficile colitis despite negative toxin.  She was treated with vancomycin. 9/29 Off pressors 10/2: Extubated 10/3 transfer to floor. 10/10 underwent repeat proctosigmoidoscopy for ongoing diarrhea.  Diffuse severe inflammation identified due to combination of Crohn's disease, active C. difficile infection and possible ischemic colitis from prior hypotension on presentation. 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  Intraoperative findings include frank necrosis with pelvic abscess formation and significant colitis. 10/12 some dilated small bowel with minimal OG output.  Continued observation advised.  Having significant postoperative pain. 10/13 became tachycardic and hypotensive.  More lethargic.  Transferred back to ICU. 10/15 blood cultures reports fungemia, resume PO vancomycin 10/17 CT abdomen pelvis notable for septic emboli - CTA chest on hold given IV access issues 10/18 TEE negative for endocarditis/bubble study - NG out for TEE probe.  Advance  diet, hoping to avoid replacement of NG tube 10/19 wound VAC dysfunctional, surgery switching to wet-to-dry over the weekend -pain poorly controlled today, add oxycodone (medication lists this is an allergy but patient verbally approved) 10/20 pain control improving, transition from IV fentanyl to Dilaudid, continue p.o. oxycodone, restart home anxiolytics with Xanax 10/21 Successful left subclavian CVC placement with IR  Subjective: Seen and examined earlier this morning.  No major events overnight of this morning.  Complaining of abdominal cramping after iron tablets.  She also reports pain at thoracocentesis site and nausea.  She is asking for nausea and pain medications.   Objective: Vitals:   03/02/23 0811 03/02/23 0930 03/02/23 1139 03/02/23 1647  BP: (!) 133/56 122/65 127/65 131/68  Pulse:   (!) 116 100  Resp: 16 16 15 14   Temp: 97.9 F (36.6 C) (!) 97.3 F (36.3 C) 98.1 F (36.7 C) 97.7 F (36.5 C)  TempSrc: Oral  Oral Oral  SpO2:  95% 93%   Weight:      Height:        Examination:  GENERAL: No apparent distress.  Nontoxic. HEENT: MMM.  Vision and hearing grossly intact.  NECK: Supple.  No apparent JVD.  RESP:  No IWOB.  Fair aeration bilaterally. CVS:  RRR. Heart sounds normal.  ABD/GI/GU: BS+.  Wound VAC over laparotomy wound.  Ileostomy bag. MSK/EXT:  Moves extremities. No apparent deformity. No edema.  SKIN: no apparent skin lesion or wound NEURO: Awake, alert and oriented appropriately.  No apparent focal neuro deficit. PSYCH: Calm. Normal affect.    Microbiology summarized: 9/26-COVID-19, influenza and RSV PCR nonreactive 9/26 and 10/13-MRSA PCR screen negative 9/26-blood culture with staph hominis in 1 out of 2 bottles likely contaminant 9/29-C. difficile PCR positive  9/29-GIP negative 10/12-blood culture with Candida glabrata in 1 out of 2 bottles 10/17-blood cultures NGTD 10/27-pleural fluid culture NGTD  Assessment and plan: Septic shock,  multifactorial-including C. difficile colitis, fungemia, intra-abdominal abscess Fungemia, candida glabrata, likely POA Cdiff Colitis: POA Septic emboli, thrombocytosis Intra-abdominal infection/abscess -Culture data as above. -S/p total colectomy with ileostomy -Received ceftriaxone and Flagyl for extended course until 10/28. -TTE/TEE negative for vegetation.  Bubble study negative. -Appreciate input by ID -Continue antifungal for 6 weeks for fungemia.  Continue micafungin pending sensitivity -Continue extended p.o. vancomycin for C. difficile infection -Complete 14 days of p.o. vancomycin followed by taper -Ophthalmology or neuro consultation.  -Evaluated by ophthalmology on 10/23.  No evidence of fungal infiltrate on funduscopic evaluation -Left subclavian tunneled CVC placed 02/21/2023 with IR -Diarrhea and leukocytosis improving.  Diarrhea partly due to colectomy.   Acute pulmonary emboli/right upper extremity DVT -Continue Lovenox   Moderate to severe malnutrition Postoperative short bowel syndrome/high output ostomy, improving Malabsorption likely acute on chronic -Encourage oral intake. -Optimize nutrition -IV fluid for high output   Left-sided pleural effusion/iatrogenic pneumothorax, small: S/p thoracocentesis on 10/27.  Fluid is exudative by LDH.  Fluid culture NGTD.  Subsequent chest x-ray on 10/28 without pneumothorax. 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  Previous provider discussed with Dr. Cardell Peach with urology who recommended outpatient evaluation with cystoscopy  Intractable pain, multifactorial -Continue fentanyl patch along with as needed meds   Thrombocytosis: Felt to be reactive from acute infection per discussion between previous provider and hematology. Studies were ordered including BCR-ABL 1 , CML/AL L PCR quantitative which was reported as being negative.  JAK2 was also  ordered and is pending.  Recent Labs  Lab 02/24/23 0441 02/25/23 0529 02/26/23 0405 02/27/23 0530 02/28/23 0645 03/01/23 0445 03/02/23 0001  PLT 1,272* 1,253* 1,095* 1,064* 1,039* 937* 969*  -Continue monitoring -Outpatient follow-up   Longstanding Crohn's disease: Not chronically on steroids so less concern for adrenal insufficiency. S/p colectomy -Hold steroids/Skyrizi in the setting of ongoing infection.   Sinus tachycardia: Likely due to acute illness and possible dehydration.  TSH normal.  He had some pericardial effusion without tamponade.  Repeat CT on 10/25 did not reveal pericardial effusion. -Increase metoprolol to 37.5 mg twice daily -IV fluid as needed  Pericardial effusion without tamponade: Initially seen on echocardiogram. CT angio from 02/25/2023 did not reveal any pericardial effusion.   -May need outpatient echocardiogram to confirm resolution.  Cardiac arrest secondary to septic shock requiring CPR on admission Echocardiogram on admit within normal limits   Controlled DM-2 with hyperglycemia: A1c 5.8% on 9/26. Recent Labs  Lab 03/01/23 2315 03/02/23 0343 03/02/23 0812 03/02/23 1142 03/02/23 1649  GLUCAP 106* 108* 119* 126* 147*  -Continue current insulin regimen   History of seizures -Continue PTA keppra/gabapentin   History of anxiety -Resume home Xanax, Cymbalta, hydroxyzine   History of hypertension Currently just on metoprolol.   History of COPD -Continue xopenex PRN -Switch PTA spiriva to yupelri per forumulary   GERD -Continue PTA PPI   Normocytic Anemia: Stable -Transfuse for Hb <7 or hemodynamically significant bleeding    Hypokalemia/hypomagnesemia -Monitor replenish as appropriate   Inadequate oral intake Body mass index is 23.71 kg/m. Nutrition Problem: Inadequate oral intake Etiology: inability to eat Signs/Symptoms: NPO status Interventions: Refer to RD note for recommendations   DVT prophylaxis:  SCDs Start:  01/27/23 1111  Code Status: Full code Family Communication: None at  bedside Level of care: Progressive Status is: Inpatient Remains inpatient appropriate because: Sepsis, tachycardia   Final disposition:  Consultants:  Pulmonology General Surgery Hematology Urology Infectious disease Ophthalmology   55 minutes with more than 50% spent in reviewing records, counseling patient/family and coordinating care.   Sch Meds:  Scheduled Meds:  acetaminophen  650 mg Oral Q6H   amitriptyline  25 mg Oral QHS   Chlorhexidine Gluconate Cloth  6 each Topical Daily   diphenoxylate-atropine  1 tablet Oral BID   DULoxetine  30 mg Oral Daily   enoxaparin (LOVENOX) injection  70 mg Subcutaneous Q12H   feeding supplement  237 mL Oral TID BM   fentaNYL  1 patch Transdermal Q72H   folic acid  1 mg Oral Daily   gabapentin  100 mg Oral TID   Gerhardt's butt cream   Topical BID   insulin aspart  0-9 Units Subcutaneous Q4H   levETIRAcetam  750 mg Oral BID   lidocaine  2 patch Transdermal Q24H   lidocaine (PF)  8 mL Intradermal Once   loperamide  4 mg Oral QID   loratadine  10 mg Oral Daily   methocarbamol  1,000 mg Oral TID   metoprolol tartrate  37.5 mg Oral BID   multivitamin with minerals  1 tablet Oral Daily   pantoprazole  40 mg Oral Daily   revefenacin  175 mcg Nebulization Daily   thiamine  100 mg Oral Daily   vancomycin  125 mg Oral QID   Followed by   Melene Muller ON 03/15/2023] vancomycin  125 mg Oral BID   Followed by   Melene Muller ON 03/22/2023] vancomycin  125 mg Oral Daily   Followed by   Melene Muller ON 03/29/2023] vancomycin  125 mg Oral QODAY   Followed by   Melene Muller ON 04/06/2023] vancomycin  125 mg Oral Q3 days   Continuous Infusions:  micafungin (MYCAMINE) 150 mg in sodium chloride 0.9 % 100 mL IVPB 150 mg (03/02/23 0450)   PRN Meds:.ALPRAZolam, bismuth subsalicylate, camphor-menthol, dextrose, HYDROmorphone (DILAUDID) injection, levalbuterol, magic mouthwash, metoprolol tartrate,  ondansetron (ZOFRAN) IV, mouth rinse, oxyCODONE  Antimicrobials: Anti-infectives (From admission, onward)    Start     Dose/Rate Route Frequency Ordered Stop   04/06/23 1000  vancomycin (VANCOCIN) capsule 125 mg       Placed in "Followed by" Linked Group   125 mg Oral Every 3 DAYS 03/01/23 0750 04/15/23 0959   03/29/23 1000  vancomycin (VANCOCIN) capsule 125 mg       Placed in "Followed by" Linked Group   125 mg Oral Every other day 03/01/23 0750 04/06/23 0959   03/22/23 1000  vancomycin (VANCOCIN) capsule 125 mg       Placed in "Followed by" Linked Group   125 mg Oral Daily 03/01/23 0750 03/29/23 0959   03/15/23 1000  vancomycin (VANCOCIN) capsule 125 mg       Placed in "Followed by" Linked Group   125 mg Oral 2 times daily 03/01/23 0750 03/22/23 0959   03/01/23 1000  vancomycin (VANCOCIN) capsule 125 mg       Placed in "Followed by" Linked Group   125 mg Oral 4 times daily 03/01/23 0750 03/15/23 0959   02/18/23 1800  vancomycin (VANCOCIN) 50 mg/mL oral solution SOLN 125 mg        125 mg Oral 4 times daily 02/18/23 1439 02/26/23 0959   02/18/23 0400  micafungin (MYCAMINE) 150 mg in sodium chloride 0.9 % 100 mL IVPB  150 mg 107.5 mL/hr over 1 Hours Intravenous Every 24 hours 02/17/23 0936     02/17/23 0900  vancomycin (VANCOREADY) IVPB 750 mg/150 mL  Status:  Discontinued        750 mg 150 mL/hr over 60 Minutes Intravenous Every 8 hours 02/17/23 0204 02/17/23 0947   02/17/23 0400  micafungin (MYCAMINE) 100 mg in sodium chloride 0.9 % 100 mL IVPB  Status:  Discontinued        100 mg 105 mL/hr over 1 Hours Intravenous Every 24 hours 02/16/23 0353 02/17/23 0936   02/16/23 1045  vancomycin (VANCOCIN) 50 mg/mL oral solution SOLN 125 mg  Status:  Discontinued        125 mg Per Tube 4 times daily 02/16/23 0953 02/18/23 1439   02/16/23 0415  micafungin (MYCAMINE) 100 mg in sodium chloride 0.9 % 100 mL IVPB        100 mg 105 mL/hr over 1 Hours Intravenous STAT 02/16/23 0353 02/16/23  0610   02/15/23 1300  vancomycin (VANCOREADY) IVPB 750 mg/150 mL  Status:  Discontinued        750 mg 150 mL/hr over 60 Minutes Intravenous Every 12 hours 02/14/23 1156 02/17/23 0204   02/14/23 1130  vancomycin (VANCOCIN) IVPB 1000 mg/200 mL premix        1,000 mg 200 mL/hr over 60 Minutes Intravenous  Once 02/14/23 1043 02/14/23 1249   02/11/23 2000  cefTRIAXone (ROCEPHIN) 2 g in sodium chloride 0.9 % 100 mL IVPB  Status:  Discontinued       Note to Pharmacy: Pharmacy may adjust dosing strength / duration / interval for maximal efficacy   2 g 200 mL/hr over 30 Minutes Intravenous Every 24 hours 02/11/23 1622 02/28/23 0852   02/10/23 1600  metroNIDAZOLE (FLAGYL) IVPB 500 mg  Status:  Discontinued        500 mg 100 mL/hr over 60 Minutes Intravenous Every 12 hours 02/10/23 1444 02/28/23 0857   02/08/23 1800  vancomycin (VANCOCIN) 50 mg/mL oral solution SOLN 500 mg        500 mg Per Tube Every 6 hours 02/08/23 1325 02/12/23 1759   02/03/23 2200  metroNIDAZOLE (FLAGYL) tablet 500 mg        500 mg Per Tube Every 12 hours 02/03/23 1406 02/06/23 2148   02/02/23 1815  vancomycin (VANCOCIN) 50 mg/mL oral solution SOLN 125 mg  Status:  Discontinued        125 mg Per Tube Every 6 hours 02/02/23 1717 02/08/23 1325   02/02/23 1545  vancomycin (VANCOCIN) capsule 125 mg  Status:  Discontinued        125 mg Oral 4 times daily 02/02/23 1530 02/02/23 1717   01/27/23 1430  cefTRIAXone (ROCEPHIN) 2 g in sodium chloride 0.9 % 100 mL IVPB        2 g 200 mL/hr over 30 Minutes Intravenous Every 24 hours 01/27/23 1340 02/05/23 1403   01/27/23 1400  metroNIDAZOLE (FLAGYL) IVPB 500 mg  Status:  Discontinued        500 mg 100 mL/hr over 60 Minutes Intravenous Every 12 hours 01/27/23 1340 02/03/23 1406   01/27/23 0545  vancomycin (VANCOREADY) IVPB 1250 mg/250 mL        1,250 mg 166.7 mL/hr over 90 Minutes Intravenous  Once 01/27/23 0542 01/27/23 0809   01/27/23 0545  ceFEPIme (MAXIPIME) 2 g in sodium chloride 0.9  % 100 mL IVPB        2 g 200 mL/hr over 30 Minutes  Intravenous  Once 01/27/23 0542 01/27/23 0621   01/27/23 0545  metroNIDAZOLE (FLAGYL) IVPB 500 mg        500 mg 100 mL/hr over 60 Minutes Intravenous  Once 01/27/23 0542 01/27/23 0741        I have personally reviewed the following labs and images: CBC: Recent Labs  Lab 02/26/23 0405 02/27/23 0530 02/28/23 0645 03/01/23 0445 03/02/23 0001  WBC 19.0* 14.1* 16.8* 15.1* 12.0*  HGB 10.2* 10.0* 10.5* 10.2* 10.1*  HCT 32.1* 31.5* 33.2* 32.5* 31.8*  MCV 86.3 85.4 86.7 86.0 85.5  PLT 1,095* 1,064* 1,039* 937* 969*   BMP &GFR Recent Labs  Lab 02/24/23 0441 02/25/23 0529 02/27/23 0530 03/02/23 0001  NA 131* 134* 133* 131*  K 4.0 4.0 4.0 4.2  CL 94* 95* 93* 94*  CO2 28 27 27 26   GLUCOSE 104* 113* 99 108*  BUN 6 6 6 7   CREATININE 0.45 0.43* 0.48 0.61  CALCIUM 8.9 9.3 9.6 9.6   Estimated Creatinine Clearance: 67.3 mL/min (by C-G formula based on SCr of 0.61 mg/dL). Liver & Pancreas: Recent Labs  Lab 02/24/23 0441 02/27/23 0530 03/02/23 0001  AST 13* 13* 15  ALT 7 7 8   ALKPHOS 191* 178* 151*  BILITOT 0.3 0.4 0.5  PROT 6.2* 6.9 7.0  ALBUMIN 2.4* 2.5* 2.6*   No results for input(s): "LIPASE", "AMYLASE" in the last 168 hours. No results for input(s): "AMMONIA" in the last 168 hours. Diabetic: No results for input(s): "HGBA1C" in the last 72 hours. Recent Labs  Lab 03/01/23 2315 03/02/23 0343 03/02/23 0812 03/02/23 1142 03/02/23 1649  GLUCAP 106* 108* 119* 126* 147*   Cardiac Enzymes: No results for input(s): "CKTOTAL", "CKMB", "CKMBINDEX", "TROPONINI" in the last 168 hours. No results for input(s): "PROBNP" in the last 8760 hours. Coagulation Profile: No results for input(s): "INR", "PROTIME" in the last 168 hours. Thyroid Function Tests: No results for input(s): "TSH", "T4TOTAL", "FREET4", "T3FREE", "THYROIDAB" in the last 72 hours. Lipid Profile: No results for input(s): "CHOL", "HDL", "LDLCALC", "TRIG",  "CHOLHDL", "LDLDIRECT" in the last 72 hours. Anemia Panel: No results for input(s): "VITAMINB12", "FOLATE", "FERRITIN", "TIBC", "IRON", "RETICCTPCT" in the last 72 hours. Urine analysis:    Component Value Date/Time   COLORURINE AMBER (A) 01/27/2023 0514   APPEARANCEUR HAZY (A) 01/27/2023 0514   LABSPEC 1.020 01/27/2023 0514   PHURINE 5.0 01/27/2023 0514   GLUCOSEU NEGATIVE 01/27/2023 0514   GLUCOSEU NEG mg/dL 40/98/1191 4782   HGBUR NEGATIVE 01/27/2023 0514   BILIRUBINUR NEGATIVE 01/27/2023 0514   KETONESUR 5 (A) 01/27/2023 0514   PROTEINUR 30 (A) 01/27/2023 0514   UROBILINOGEN 0.2 12/26/2013 1530   NITRITE NEGATIVE 01/27/2023 0514   LEUKOCYTESUR TRACE (A) 01/27/2023 0514   Sepsis Labs: Invalid input(s): "PROCALCITONIN", "LACTICIDVEN"  Microbiology: Recent Results (from the past 240 hour(s))  Culture, body fluid w Gram Stain-bottle     Status: None (Preliminary result)   Collection Time: 02/27/23  1:00 PM   Specimen: Pleura  Result Value Ref Range Status   Specimen Description PLEURAL  Final   Special Requests LEFT  Final   Culture   Final    NO GROWTH 3 DAYS Performed at [facility], [address], [place], [place] [address]    Report Status PENDING  Incomplete  Gram stain     Status: None   Collection Time: 02/27/23  1:00 PM   Specimen: Pleura  Result Value Ref Range Status   Specimen Description PLEURAL  Final  Special Requests LEFT  Final   Gram Stain   Final    FEW WBC SEEN NO ORGANISMS SEEN Performed at [facility], [address], [place], [place] [address]    Report Status 02/27/2023 FINAL  Final    Radiology Studies: No results found.    Lise Pincus T. Jasiah Elsen [facility]  If 7PM-7AM, please contact night-coverage [email or web address] 03/02/2023, 5:12 PM

## 2023-03-02 NOTE — Plan of Care (Signed)
  Problem: Education: Goal: Knowledge of General Education information will improve Description: Including pain rating scale, medication(s)/side effects and non-pharmacologic comfort measures Outcome: Progressing   Problem: Activity: Goal: Risk for activity intolerance will decrease Outcome: Progressing   

## 2023-03-02 NOTE — Progress Notes (Signed)
Physical Therapy Treatment Patient Details Name: [name] MRN: [id number] DOB: 1971/02/17 Today's Date: 03/02/2023   History of Present Illness 52 y.o. female presents to [facility] on 01/27/2023 after being found unresponsive at home down in a pool of bloody vomit/stool. CPR was initiated upon arrival to ED, pt intubated and started on pressors. Pt undergoing management for inflammatory vs infections colitis. Pt extubated on 10/2. 10/10 underwent repeat proctosigmoidoscopy for ongoing diarrhea.  Diffuse severe inflammation identified due to combination of Crohn's disease, active C. difficile infection and possible ischemic colitis from prior hypotension on presentation. 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 10/13 became tachycardic and hypotensive.  More lethargic.  Transferred back to ICU. PMH includes COPD, Crohn's disease, HTN, DMII.    PT Comments  Pt is slowly progressing toward goals, limited by feeling poorly, dizziness with mobility and fatigue.  Emphasis on transitions, scooting, sit to stand and pre-gait activity with cga to light moderate assist.    If plan is discharge home, recommend the following: A lot of help with bathing/dressing/bathroom;Two people to help with walking and/or transfers;Assistance with cooking/housework;Direct supervision/assist for medications management;Assist for transportation;Supervision due to cognitive status;Direct supervision/assist for financial management;Help with stairs or ramp for entrance   Can travel by private vehicle        Equipment Recommendations  Rolling walker (2 wheels) (TBD)    Recommendations for Other Services       Precautions / Restrictions Precautions Precautions: Fall Precaution Comments: watch HR/RR, bulb drain L, new colostomy, abdominal wound vac Restrictions Weight Bearing Restrictions: No     Mobility  Bed Mobility Overal bed mobility: Needs Assistance Bed Mobility: Rolling, Sidelying to  Sit, Sit to Supine Rolling: Contact guard assist Sidelying to sit: Contact guard assist, Min assist   Sit to supine: Supervision   General bed mobility comments: cues to go to the side, but was able to get in straight from sit to supine without assist    Transfers Overall transfer level: Needs assistance   Transfers: Sit to/from Stand Sit to Stand: Mod assist           General transfer comment: pt scooted to EOB without assist, needed assist to come forward with some boost.    Ambulation/Gait             Pre-gait activities: pt completed some w/shifting and low amplitude steps in place 10 reps each before asking to return to bed.  Agree to 1 more stand to sidestep and repostion to lie down.     Stairs             Wheelchair Mobility     Tilt Bed    Modified Rankin (Stroke Patients Only)       Balance     Sitting balance-Leahy Scale: Fair     Standing balance support: Bilateral upper extremity supported, Reliant on assistive device for balance, During functional activity Standing balance-Leahy Scale: Poor                              Cognition Arousal: Alert Behavior During Therapy: WFL for tasks assessed/performed Overall Cognitive Status: Within Functional Limits for tasks assessed                                          Exercises      General Comments  Pertinent Vitals/Pain Pain Assessment Pain Assessment: Faces Faces Pain Scale: Hurts little more Pain Location: abdomen Pain Descriptors / Indicators: Discomfort, Grimacing, Guarding Pain Intervention(s): Limited activity within patient's tolerance, Monitored during session    Home Living                          Prior Function            PT Goals (current goals can now be found in the care plan section) Acute Rehab PT Goals Patient Stated Goal: go home PT Goal Formulation: With patient Time For Goal Achievement:  03/16/23 Potential to Achieve Goals: Good Progress towards PT goals: Progressing toward goals;Goals updated (slowly)    Frequency    Min 1X/week      PT Plan      Co-evaluation              AM-PAC PT "6 Clicks" Mobility   Outcome Measure  Help needed turning from your back to your side while in a flat bed without using bedrails?: A Little Help needed moving from lying on your back to sitting on the side of a flat bed without using bedrails?: A Little Help needed moving to and from a bed to a chair (including a wheelchair)?: A Little Help needed standing up from a chair using your arms (e.g., wheelchair or bedside chair)?: A Little Help needed to walk in hospital room?: A Little Help needed climbing 3-5 steps with a railing? : Total 6 Click Score: 16    End of Session   Activity Tolerance: Patient tolerated treatment well;Treatment limited secondary to medical complications (Comment);Patient limited by fatigue (dizziness) Patient left: with call bell/phone within reach;in bed Nurse Communication: Mobility status PT Visit Diagnosis: Other abnormalities of gait and mobility (R26.89);Muscle weakness (generalized) (M62.81)     Time: 5284-1324 PT Time Calculation (min) (ACUTE ONLY): 22 min  Charges:    $Therapeutic Activity: 8-22 mins                       03/02/2023  Jacinto Halim., PT Acute Rehabilitation Services [phone]  (office)   Eliseo Gum Deverick Pruss 03/02/2023, 1:58 PM

## 2023-03-02 NOTE — Progress Notes (Addendum)
Mobility Specialist Progress Note:   03/02/23 1617  Mobility  Activity Ambulated with assistance in room  Level of Assistance Minimal assist, patient does 75% or more  Assistive Device Front wheel walker  Distance Ambulated (ft) 30 ft  Activity Response Tolerated well  Mobility Referral Yes  $Mobility charge 1 Mobility  Mobility Specialist Start Time (ACUTE ONLY) 1600  Mobility Specialist Stop Time (ACUTE ONLY) 1615  Mobility Specialist Time Calculation (min) (ACUTE ONLY) 15 min   Pre Mobility: 109 HR  During Mobility: 130 HR Post Mobility: 115 HR   Pt received in bed, agreeable to mobility with encouragement. Pt able to tolerate increased gait distance without break or complaint during ambulation. After session pt c/o slight dizziness when sitting EOB. HR stable throughout. Pt left in supine position asymptomatic with call bell in reach and all needs met.   Leory Plowman  Mobility Specialist Please contact via [employer] office at [phone]

## 2023-03-03 DIAGNOSIS — D62 Acute posthemorrhagic anemia: Secondary | ICD-10-CM | POA: Diagnosis not present

## 2023-03-03 DIAGNOSIS — B49 Unspecified mycosis: Secondary | ICD-10-CM | POA: Diagnosis not present

## 2023-03-03 DIAGNOSIS — A0472 Enterocolitis due to Clostridium difficile, not specified as recurrent: Secondary | ICD-10-CM | POA: Diagnosis not present

## 2023-03-03 DIAGNOSIS — I469 Cardiac arrest, cause unspecified: Secondary | ICD-10-CM | POA: Diagnosis not present

## 2023-03-03 LAB — CBC WITH DIFFERENTIAL/PLATELET
Abs Immature Granulocytes: 0.16 10*3/uL — ABNORMAL HIGH (ref 0.00–0.07)
Basophils Absolute: 0.1 10*3/uL (ref 0.0–0.1)
Basophils Relative: 1 %
Eosinophils Absolute: 0.5 10*3/uL (ref 0.0–0.5)
Eosinophils Relative: 5 %
HCT: 32.9 % — ABNORMAL LOW (ref 36.0–46.0)
Hemoglobin: 10.5 g/dL — ABNORMAL LOW (ref 12.0–15.0)
Immature Granulocytes: 1 %
Lymphocytes Relative: 25 %
Lymphs Abs: 2.8 10*3/uL (ref 0.7–4.0)
MCH: 27.6 pg (ref 26.0–34.0)
MCHC: 31.9 g/dL (ref 30.0–36.0)
MCV: 86.6 fL (ref 80.0–100.0)
Monocytes Absolute: 0.8 10*3/uL (ref 0.1–1.0)
Monocytes Relative: 7 %
Neutro Abs: 6.8 10*3/uL (ref 1.7–7.7)
Neutrophils Relative %: 61 %
Platelets: 1082 10*3/uL (ref 150–400)
RBC: 3.8 MIL/uL — ABNORMAL LOW (ref 3.87–5.11)
RDW: 15 % (ref 11.5–15.5)
WBC: 11.1 10*3/uL — ABNORMAL HIGH (ref 4.0–10.5)
nRBC: 0 % (ref 0.0–0.2)

## 2023-03-03 LAB — COMPREHENSIVE METABOLIC PANEL
ALT: 9 U/L (ref 0–44)
AST: 19 U/L (ref 15–41)
Albumin: 2.7 g/dL — ABNORMAL LOW (ref 3.5–5.0)
Alkaline Phosphatase: 161 U/L — ABNORMAL HIGH (ref 38–126)
Anion gap: 12 (ref 5–15)
BUN: 6 mg/dL (ref 6–20)
CO2: 26 mmol/L (ref 22–32)
Calcium: 9.8 mg/dL (ref 8.9–10.3)
Chloride: 93 mmol/L — ABNORMAL LOW (ref 98–111)
Creatinine, Ser: 0.57 mg/dL (ref 0.44–1.00)
GFR, Estimated: >60 mL/min (ref >60)
Glucose, Bld: 89 mg/dL (ref 70–99)
Potassium: 3.9 mmol/L (ref 3.5–5.1)
Sodium: 131 mmol/L — ABNORMAL LOW (ref 135–145)
Total Bilirubin: 0.3 mg/dL (ref 0.3–1.2)
Total Protein: 6.8 g/dL (ref 6.5–8.1)

## 2023-03-03 LAB — GLUCOSE, CAPILLARY
Glucose-Capillary: 99 mg/dL (ref 70–99)
Glucose-Capillary: 152 mg/dL — ABNORMAL HIGH (ref 70–99)
Glucose-Capillary: 154 mg/dL — ABNORMAL HIGH (ref 70–99)
Glucose-Capillary: 105 mg/dL — ABNORMAL HIGH (ref 70–99)
Glucose-Capillary: 98 mg/dL (ref 70–99)

## 2023-03-03 LAB — MAGNESIUM: Magnesium: 1.5 mg/dL — ABNORMAL LOW (ref 1.7–2.4)

## 2023-03-03 MED ORDER — MAGNESIUM SULFATE 2 GM/50ML IV SOLN
2.0000 g | Freq: Once | INTRAVENOUS | Status: AC
  Administered 2023-03-03: 2 g via INTRAVENOUS
  Filled 2023-03-03: qty 50

## 2023-03-03 NOTE — Progress Notes (Signed)
Progress Note  13 Days Post-Op  Subjective: Alert, NAD. States her appetite is better but she has always eaten small amounts at a time.   Objective: Vital signs in last 24 hours: Temp:  [97.3 F (36.3 C)-98.3 F (36.8 C)] 97.7 F (36.5 C) (10/31 0426) Pulse Rate:  [100-116] 102 (10/31 0426) Resp:  [10-17] 15 (10/31 0426) BP: (112-131)/(57-68) 114/57 (10/31 0426) SpO2:  [93 %-95 %] 95 % (10/31 0819) Last BM Date : 03/02/23  Intake/Output from previous day: 10/30 0701 - 10/31 0700 In: -  Out: 520 [Drains:20; Stool:500] Intake/Output this shift: No intake/output data recorded.  PE: General: pleasant, WD, WN female who is laying in bed in NAD Heart: sinus tachycardia in the 120s Lungs: Respiratory effort nonlabored Abd: soft, appropriately ttp, midline wound as below - interval increase in granulation tissue. Wound healing nicely. Ileostomy output remains thin and watery (700 mL). JP purulent (20 mL)   Lab Results:  Recent Labs    03/02/23 0001 03/03/23 0455  WBC 12.0* 11.1*  HGB 10.1* 10.5*  HCT 31.8* 32.9*  PLT 969* 1,082*   BMET Recent Labs    03/02/23 0001 03/03/23 0455  NA 131* 131*  K 4.2 3.9  CL 94* 93*  CO2 26 26  GLUCOSE 108* 89  BUN 7 6  CREATININE 0.61 0.57  CALCIUM 9.6 9.8   PT/INR No results for input(s): "LABPROT", "INR" in the last 72 hours. CMP     Component Value Date/Time   NA 131 (L) 03/03/2023 0455   K 3.9 03/03/2023 0455   CL 93 (L) 03/03/2023 0455   CO2 26 03/03/2023 0455   GLUCOSE 89 03/03/2023 0455   BUN 6 03/03/2023 0455   CREATININE 0.57 03/03/2023 0455   CREATININE 0.68 06/09/2021 1305   CALCIUM 9.8 03/03/2023 0455   PROT 6.8 03/03/2023 0455   ALBUMIN 2.7 (L) 03/03/2023 0455   AST 19 03/03/2023 0455   ALT 9 03/03/2023 0455   ALKPHOS 161 (H) 03/03/2023 0455   BILITOT 0.3 03/03/2023 0455   GFRNONAA >60 03/03/2023 0455   GFRAA >60 07/30/2019 1301   Lipase     Component Value Date/Time   LIPASE 27 07/09/2022 1313        Studies/Results: No results found.  Anti-infectives: Anti-infectives (From admission, onward)    Start     Dose/Rate Route Frequency Ordered Stop   04/06/23 1000  vancomycin (VANCOCIN) capsule 125 mg       Placed in "Followed by" Linked Group   125 mg Oral Every 3 DAYS 03/01/23 0750 04/15/23 0959   03/29/23 1000  vancomycin (VANCOCIN) capsule 125 mg       Placed in "Followed by" Linked Group   125 mg Oral Every other day 03/01/23 0750 04/06/23 0959   03/22/23 1000  vancomycin (VANCOCIN) capsule 125 mg       Placed in "Followed by" Linked Group   125 mg Oral Daily 03/01/23 0750 03/29/23 0959   03/15/23 1000  vancomycin (VANCOCIN) capsule 125 mg       Placed in "Followed by" Linked Group   125 mg Oral 2 times daily 03/01/23 0750 03/22/23 0959   03/01/23 1000  vancomycin (VANCOCIN) capsule 125 mg       Placed in "Followed by" Linked Group   125 mg Oral 4 times daily 03/01/23 0750 03/15/23 0959   02/18/23 1800  vancomycin (VANCOCIN) 50 mg/mL oral solution SOLN 125 mg        125 mg Oral 4  times daily 02/18/23 1439 02/26/23 0959   02/18/23 0400  micafungin (MYCAMINE) 150 mg in sodium chloride 0.9 % 100 mL IVPB        150 mg 107.5 mL/hr over 1 Hours Intravenous Every 24 hours 02/17/23 0936     02/17/23 0900  vancomycin (VANCOREADY) IVPB 750 mg/150 mL  Status:  Discontinued        750 mg 150 mL/hr over 60 Minutes Intravenous Every 8 hours 02/17/23 0204 02/17/23 0947   02/17/23 0400  micafungin (MYCAMINE) 100 mg in sodium chloride 0.9 % 100 mL IVPB  Status:  Discontinued        100 mg 105 mL/hr over 1 Hours Intravenous Every 24 hours 02/16/23 0353 02/17/23 0936   02/16/23 1045  vancomycin (VANCOCIN) 50 mg/mL oral solution SOLN 125 mg  Status:  Discontinued        125 mg Per Tube 4 times daily 02/16/23 0953 02/18/23 1439   02/16/23 0415  micafungin (MYCAMINE) 100 mg in sodium chloride 0.9 % 100 mL IVPB        100 mg 105 mL/hr over 1 Hours Intravenous STAT 02/16/23 0353 02/16/23  0610   02/15/23 1300  vancomycin (VANCOREADY) IVPB 750 mg/150 mL  Status:  Discontinued        750 mg 150 mL/hr over 60 Minutes Intravenous Every 12 hours 02/14/23 1156 02/17/23 0204   02/14/23 1130  vancomycin (VANCOCIN) IVPB 1000 mg/200 mL premix        1,000 mg 200 mL/hr over 60 Minutes Intravenous  Once 02/14/23 1043 02/14/23 1249   02/11/23 2000  cefTRIAXone (ROCEPHIN) 2 g in sodium chloride 0.9 % 100 mL IVPB  Status:  Discontinued       Note to Pharmacy: Pharmacy may adjust dosing strength / duration / interval for maximal efficacy   2 g 200 mL/hr over 30 Minutes Intravenous Every 24 hours 02/11/23 1622 02/28/23 0852   02/10/23 1600  metroNIDAZOLE (FLAGYL) IVPB 500 mg  Status:  Discontinued        500 mg 100 mL/hr over 60 Minutes Intravenous Every 12 hours 02/10/23 1444 02/28/23 0857   02/08/23 1800  vancomycin (VANCOCIN) 50 mg/mL oral solution SOLN 500 mg        500 mg Per Tube Every 6 hours 02/08/23 1325 02/12/23 1759   02/03/23 2200  metroNIDAZOLE (FLAGYL) tablet 500 mg        500 mg Per Tube Every 12 hours 02/03/23 1406 02/06/23 2148   02/02/23 1815  vancomycin (VANCOCIN) 50 mg/mL oral solution SOLN 125 mg  Status:  Discontinued        125 mg Per Tube Every 6 hours 02/02/23 1717 02/08/23 1325   02/02/23 1545  vancomycin (VANCOCIN) capsule 125 mg  Status:  Discontinued        125 mg Oral 4 times daily 02/02/23 1530 02/02/23 1717   01/27/23 1430  cefTRIAXone (ROCEPHIN) 2 g in sodium chloride 0.9 % 100 mL IVPB        2 g 200 mL/hr over 30 Minutes Intravenous Every 24 hours 01/27/23 1340 02/05/23 1403   01/27/23 1400  metroNIDAZOLE (FLAGYL) IVPB 500 mg  Status:  Discontinued        500 mg 100 mL/hr over 60 Minutes Intravenous Every 12 hours 01/27/23 1340 02/03/23 1406   01/27/23 0545  vancomycin (VANCOREADY) IVPB 1250 mg/250 mL        1,250 mg 166.7 mL/hr over 90 Minutes Intravenous  Once 01/27/23 0542 01/27/23 0809  01/27/23 0545  ceFEPIme (MAXIPIME) 2 g in sodium chloride 0.9  % 100 mL IVPB        2 g 200 mL/hr over 30 Minutes Intravenous  Once 01/27/23 0542 01/27/23 0621   01/27/23 0545  metroNIDAZOLE (FLAGYL) IVPB 500 mg        500 mg 100 mL/hr over 60 Minutes Intravenous  Once 01/27/23 0542 01/27/23 0741        Assessment/Plan Refractory Crohn's disease 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 Luisa Hart  - afebrile, WBC improving 11.1 - no signs of undrained abdominal collection on CT 10/16 - TEE negative for endocarditis  - CT abdomen/pelvis 10/15 for leukocytosis showed reduced mesenteric fluid collections, pleural effusions, mild ileus.  - soft diet, QID imodium, fiber, iron - monitor output and titrate as needed  - no PICC/TPN in setting of bacteremia/possible fungemia  - Continue drain - continue medical treatment for C.diff per ID - mobilize - WOC following for new ileostomy; D/C wound VAC and start daily moist-to-dry dressing changes.  - CCS will continue to see the patient at least once weekly while she is admitted.   FEN: soft diet VTE: Lovenox 70 mg BID- acute DVT noted in BUE 10/19 ID: extended course of vanc per ID.  - per CCM -  Acute DVT/PEs - [place] 10/19 with L brachial DVT and R internal jugular and brachial DVTs  Splenic infarcts - nothing surgical recommended at this time but agree with ID need to eval etiology of emboli  COPD HTN Chronic pain  Collagen vascular disease T2DM Seizure disorder  LOS: 35 days     Adam Phenix, [facility] 03/03/2023, 8:46 AM Please see Amion for pager number during day hours 7:00am-4:30pm

## 2023-03-03 NOTE — Progress Notes (Signed)
PROGRESS NOTE  [name] ZOX:[id number] DOB: May 25, 1970   PCP: [name], PA-C  Patient is from: Home  DOA: 01/27/2023 LOS: 35  Chief complaints Chief Complaint  Patient presents with   Respiratory Arrest     Brief Narrative / Interim history: 52 year old female with past medical history of COPD, Crohn's on Skyrizi, hypertension, type 2 diabetes mellitus who initially presented to the after being found unresponsive. Per family, patient has had bloody emesis with diarrhea over the past few days. Initial systolics in the 60s in the ED, and patient was minimally responsive. In the emergency room, patient was intubated, and patient was admitted to the ICU for further evaluation and management for shock and altered mental status.    01/27/2023: Admitted to ICU and intubated.  Felt to have septic shock of unclear source which resolved antibiotic therapy and fluid resuscitation.  Suspicion was that she may have had C. difficile colitis despite negative toxin.  She was treated with vancomycin. 9/29 Off pressors 10/2: Extubated 10/3 transfer to floor. 10/10 underwent repeat proctosigmoidoscopy for ongoing diarrhea.  Diffuse severe inflammation identified due to combination of Crohn's disease, active C. difficile infection and possible ischemic colitis from prior hypotension on presentation. 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  Intraoperative findings include frank necrosis with pelvic abscess formation and significant colitis. 10/12 some dilated small bowel with minimal OG output.  Continued observation advised.  Having significant postoperative pain. 10/13 became tachycardic and hypotensive.  More lethargic.  Transferred back to ICU. 10/15 blood cultures reports fungemia, resume PO vancomycin 10/17 CT abdomen pelvis notable for septic emboli - CTA chest on hold given IV access issues 10/18 TEE negative for endocarditis/bubble study - NG out for TEE probe.  Advance  diet, hoping to avoid replacement of NG tube 10/19 wound VAC dysfunctional, surgery switching to wet-to-dry over the weekend -pain poorly controlled today, add oxycodone (medication lists this is an allergy but patient verbally approved) 10/20 pain control improving, transition from IV fentanyl to Dilaudid, continue p.o. oxycodone, restart home anxiolytics with Xanax 10/21 Successful left subclavian CVC placement with IR  Subjective: Seen and examined earlier this morning.  No major events overnight of this morning.  No complaints.  Wound VAC removed this morning.  Currently getting wet-to-dry dressing.  Objective: Vitals:   03/03/23 0426 03/03/23 0819 03/03/23 0913 03/03/23 1350  BP: (!) 114/57  (!) 126/46 111/61  Pulse: (!) 102  (!) 105 (!) 112  Resp: 15  17 15   Temp: 97.7 F (36.5 C)  97.7 F (36.5 C)   TempSrc: Oral  Oral Oral  SpO2: 95% 95% 96% 97%  Weight:      Height:        Examination:  GENERAL: No apparent distress.  Nontoxic. HEENT: MMM.  Vision and hearing grossly intact.  NECK: Supple.  No apparent JVD.  RESP:  No IWOB.  Fair aeration bilaterally. CVS: HR 110s.  Regular rhythm.  Heart sounds normal.  ABD/GI/GU: BS+.  Dressing over laparotomy wound DCI.  Ileostomy bag to RLQ.  JP drain to LLQ. MSK/EXT:  Moves extremities. No apparent deformity. No edema.  SKIN: no apparent skin lesion or wound NEURO: Awake, alert and oriented appropriately.  No apparent focal neuro deficit. PSYCH: Calm. Normal affect.    Microbiology summarized: 9/26-COVID-19, influenza and RSV PCR nonreactive 9/26 and 10/13-MRSA PCR screen negative 9/26-blood culture with staph hominis in 1 out of 2 bottles likely contaminant 9/29-C. difficile PCR positive 9/29-GIP negative 10/12-blood culture with Candida glabrata  in 1 out of 2 bottles 10/17-blood cultures NGTD 10/27-pleural fluid culture NGTD  Assessment and plan: Septic shock, multifactorial-including C. difficile colitis, fungemia,  intra-abdominal abscess Fungemia, candida glabrata, likely POA Cdiff Colitis: POA Septic emboli, thrombocytosis Intra-abdominal infection/abscess -Culture data as above. -S/p laparotomy with total colectomy with ileostomy and LLQ JP drain -Received ceftriaxone and Flagyl for extended course until 10/28. -TTE/TEE negative for vegetation.  Bubble study negative. -Appreciate input by ID -Continue antifungal for 6 weeks for fungemia.  Continue micafungin pending sensitivity -Complete 14 days of p.o. vancomycin followed by taper for C. difficile -Ophthalmology or neuro consultation.  -Evaluated by ophthalmology on 10/23.  No evidence of fungal infiltrate on funduscopic evaluation -Left subclavian tunneled CVC placed 02/21/2023 with IR -Diarrhea and leukocytosis improving.  Diarrhea partly due to colectomy. -Wound care per general surgery and wound care staff.  WTD daily   Acute pulmonary emboli/right upper extremity DVT -Continue Lovenox   Moderate to severe malnutrition Postoperative short bowel syndrome/high output ostomy, improving Malabsorption likely acute on chronic -Encourage oral intake. -Optimize nutrition -IVF as needed -Not a candidate for TPN due to fungemia   Left-sided pleural effusion/iatrogenic pneumothorax, small: S/p thoracocentesis on 10/27.  Fluid is exudative by LDH.  Fluid culture NGTD.  Subsequent chest x-ray on 10/28 without pneumothorax. 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  Previous provider discussed with Dr. Cardell Peach with urology who recommended outpatient evaluation with cystoscopy  Intractable pain, multifactorial -Continue fentanyl patch along with as needed meds   Thrombocytosis: Felt to be reactive from acute infection per discussion between previous provider and hematology. Studies were ordered including BCR-ABL 1 , CML/AL L PCR quantitative which was reported as  being negative.  JAK2 was also ordered and is pending.  Recent Labs  Lab 02/25/23 0529 02/26/23 0405 02/27/23 0530 02/28/23 0645 03/01/23 0445 03/02/23 0001 03/03/23 0455  PLT 1,253* 1,095* 1,064* 1,039* 937* 969* 1,082*  -Continue monitoring -Outpatient follow-up   Longstanding Crohn's disease: Not chronically on steroids so less concern for adrenal insufficiency. S/p colectomy -Hold steroids/Skyrizi in the setting of ongoing infection.   Sinus tachycardia: Likely due to acute illness and possible dehydration.  TSH normal.  He had some pericardial effusion without tamponade.  Repeat CT on 10/25 did not reveal pericardial effusion. -Increased metoprolol to 37.5 mg twice daily on 10/30 -IV fluid as needed  Pericardial effusion without tamponade: Initially seen on TTE.  CT angio from 10/25 did not reveal any pericardial effusion.   -May need outpatient echocardiogram to confirm resolution.  Cardiac arrest secondary to septic shock requiring CPR on admission   Controlled DM-2 with hyperglycemia: A1c 5.8% on 9/26. Recent Labs  Lab 03/02/23 2058 03/02/23 2357 03/03/23 0423 03/03/23 0907 03/03/23 1344  GLUCAP 92 106* 99 152* 154*  -Continue current insulin regimen   History of seizures -Continue PTA keppra/gabapentin   History of anxiety -Resume home Xanax, Cymbalta, hydroxyzine   History of hypertension Currently just on metoprolol.   History of COPD -Continue xopenex PRN -Switch PTA spiriva to yupelri per forumulary   GERD -Continue PTA PPI   Normocytic Anemia: Stable -Transfuse for Hb <7 or hemodynamically significant bleeding    Hypokalemia/hypomagnesemia -Monitor replenish as appropriate   Inadequate oral intake Body mass index is 23.71 kg/m. Nutrition Problem: Inadequate oral intake Etiology: inability to eat Signs/Symptoms: NPO status Interventions: Refer to RD note for recommendations   DVT prophylaxis:  SCDs Start: 01/27/23 1111  Code Status:  Full code Family Communication: None at bedside Level of care: Telemetry Cardiac Status is: Inpatient Remains inpatient appropriate because: Sepsis, tachycardia   Final disposition:  Consultants:  Pulmonology General Surgery Hematology Urology Infectious disease Ophthalmology   55 minutes with more than 50% spent in reviewing records, counseling patient/family and coordinating care.   Sch Meds:  Scheduled Meds:  acetaminophen  650 mg Oral Q6H   amitriptyline  25 mg Oral QHS   Chlorhexidine Gluconate Cloth  6 each Topical Daily   diphenoxylate-atropine  1 tablet Oral BID   DULoxetine  30 mg Oral Daily   enoxaparin (LOVENOX) injection  70 mg Subcutaneous Q12H   feeding supplement  237 mL Oral TID BM   fentaNYL  1 patch Transdermal Q72H   folic acid  1 mg Oral Daily   gabapentin  100 mg Oral TID   Gerhardt's butt cream   Topical BID   insulin aspart  0-9 Units Subcutaneous Q4H   levETIRAcetam  750 mg Oral BID   lidocaine  2 patch Transdermal Q24H   lidocaine (PF)  8 mL Intradermal Once   loperamide  4 mg Oral QID   loratadine  10 mg Oral Daily   methocarbamol  1,000 mg Oral TID   metoprolol tartrate  37.5 mg Oral BID   multivitamin with minerals  1 tablet Oral Daily   pantoprazole  40 mg Oral Daily   revefenacin  175 mcg Nebulization Daily   thiamine  100 mg Oral Daily   vancomycin  125 mg Oral QID   Followed by   Melene Muller ON 03/15/2023] vancomycin  125 mg Oral BID   Followed by   Melene Muller ON 03/22/2023] vancomycin  125 mg Oral Daily   Followed by   Melene Muller ON 03/29/2023] vancomycin  125 mg Oral QODAY   Followed by   Melene Muller ON 04/06/2023] vancomycin  125 mg Oral Q3 days   Continuous Infusions:  micafungin (MYCAMINE) 150 mg in sodium chloride 0.9 % 100 mL IVPB 150 mg (03/03/23 0451)   PRN Meds:.ALPRAZolam, bismuth subsalicylate, camphor-menthol, dextrose, HYDROmorphone (DILAUDID) injection, levalbuterol, magic mouthwash, metoprolol tartrate, ondansetron (ZOFRAN) IV,  mouth rinse, oxyCODONE  Antimicrobials: Anti-infectives (From admission, onward)    Start     Dose/Rate Route Frequency Ordered Stop   04/06/23 1000  vancomycin (VANCOCIN) capsule 125 mg       Placed in "Followed by" Linked Group   125 mg Oral Every 3 DAYS 03/01/23 0750 04/15/23 0959   03/29/23 1000  vancomycin (VANCOCIN) capsule 125 mg       Placed in "Followed by" Linked Group   125 mg Oral Every other day 03/01/23 0750 04/06/23 0959   03/22/23 1000  vancomycin (VANCOCIN) capsule 125 mg       Placed in "Followed by" Linked Group   125 mg Oral Daily 03/01/23 0750 03/29/23 0959   03/15/23 1000  vancomycin (VANCOCIN) capsule 125 mg       Placed in "Followed by" Linked Group   125 mg Oral 2 times daily 03/01/23 0750 03/22/23 0959   03/01/23 1000  vancomycin (VANCOCIN) capsule 125 mg       Placed in "Followed by" Linked Group   125 mg Oral 4 times daily 03/01/23 0750 03/15/23 0959   02/18/23 1800  vancomycin (VANCOCIN) 50 mg/mL oral solution SOLN 125 mg        125 mg Oral 4 times daily 02/18/23 1439 02/26/23 0959   02/18/23 0400  micafungin (MYCAMINE) 150 mg in  sodium chloride 0.9 % 100 mL IVPB        150 mg 107.5 mL/hr over 1 Hours Intravenous Every 24 hours 02/17/23 0936     02/17/23 0900  vancomycin (VANCOREADY) IVPB 750 mg/150 mL  Status:  Discontinued        750 mg 150 mL/hr over 60 Minutes Intravenous Every 8 hours 02/17/23 0204 02/17/23 0947   02/17/23 0400  micafungin (MYCAMINE) 100 mg in sodium chloride 0.9 % 100 mL IVPB  Status:  Discontinued        100 mg 105 mL/hr over 1 Hours Intravenous Every 24 hours 02/16/23 0353 02/17/23 0936   02/16/23 1045  vancomycin (VANCOCIN) 50 mg/mL oral solution SOLN 125 mg  Status:  Discontinued        125 mg Per Tube 4 times daily 02/16/23 0953 02/18/23 1439   02/16/23 0415  micafungin (MYCAMINE) 100 mg in sodium chloride 0.9 % 100 mL IVPB        100 mg 105 mL/hr over 1 Hours Intravenous STAT 02/16/23 0353 02/16/23 0610   02/15/23 1300   vancomycin (VANCOREADY) IVPB 750 mg/150 mL  Status:  Discontinued        750 mg 150 mL/hr over 60 Minutes Intravenous Every 12 hours 02/14/23 1156 02/17/23 0204   02/14/23 1130  vancomycin (VANCOCIN) IVPB 1000 mg/200 mL premix        1,000 mg 200 mL/hr over 60 Minutes Intravenous  Once 02/14/23 1043 02/14/23 1249   02/11/23 2000  cefTRIAXone (ROCEPHIN) 2 g in sodium chloride 0.9 % 100 mL IVPB  Status:  Discontinued       Note to Pharmacy: Pharmacy may adjust dosing strength / duration / interval for maximal efficacy   2 g 200 mL/hr over 30 Minutes Intravenous Every 24 hours 02/11/23 1622 02/28/23 0852   02/10/23 1600  metroNIDAZOLE (FLAGYL) IVPB 500 mg  Status:  Discontinued        500 mg 100 mL/hr over 60 Minutes Intravenous Every 12 hours 02/10/23 1444 02/28/23 0857   02/08/23 1800  vancomycin (VANCOCIN) 50 mg/mL oral solution SOLN 500 mg        500 mg Per Tube Every 6 hours 02/08/23 1325 02/12/23 1759   02/03/23 2200  metroNIDAZOLE (FLAGYL) tablet 500 mg        500 mg Per Tube Every 12 hours 02/03/23 1406 02/06/23 2148   02/02/23 1815  vancomycin (VANCOCIN) 50 mg/mL oral solution SOLN 125 mg  Status:  Discontinued        125 mg Per Tube Every 6 hours 02/02/23 1717 02/08/23 1325   02/02/23 1545  vancomycin (VANCOCIN) capsule 125 mg  Status:  Discontinued        125 mg Oral 4 times daily 02/02/23 1530 02/02/23 1717   01/27/23 1430  cefTRIAXone (ROCEPHIN) 2 g in sodium chloride 0.9 % 100 mL IVPB        2 g 200 mL/hr over 30 Minutes Intravenous Every 24 hours 01/27/23 1340 02/05/23 1403   01/27/23 1400  metroNIDAZOLE (FLAGYL) IVPB 500 mg  Status:  Discontinued        500 mg 100 mL/hr over 60 Minutes Intravenous Every 12 hours 01/27/23 1340 02/03/23 1406   01/27/23 0545  vancomycin (VANCOREADY) IVPB 1250 mg/250 mL        1,250 mg 166.7 mL/hr over 90 Minutes Intravenous  Once 01/27/23 0542 01/27/23 0809   01/27/23 0545  ceFEPIme (MAXIPIME) 2 g in sodium chloride 0.9 % 100 mL IVPB  2  g 200 mL/hr over 30 Minutes Intravenous  Once 01/27/23 0542 01/27/23 0621   01/27/23 0545  metroNIDAZOLE (FLAGYL) IVPB 500 mg        500 mg 100 mL/hr over 60 Minutes Intravenous  Once 01/27/23 0542 01/27/23 0741        I have personally reviewed the following labs and images: CBC: Recent Labs  Lab 02/27/23 0530 02/28/23 0645 03/01/23 0445 03/02/23 0001 03/03/23 0455  WBC 14.1* 16.8* 15.1* 12.0* 11.1*  NEUTROABS  --   --   --   --  6.8  HGB 10.0* 10.5* 10.2* 10.1* 10.5*  HCT 31.5* 33.2* 32.5* 31.8* 32.9*  MCV 85.4 86.7 86.0 85.5 86.6  PLT 1,064* 1,039* 937* 969* 1,082*   BMP &GFR Recent Labs  Lab 02/25/23 0529 02/27/23 0530 03/02/23 0001 03/03/23 0455  NA 134* 133* 131* 131*  K 4.0 4.0 4.2 3.9  CL 95* 93* 94* 93*  CO2 27 27 26 26   GLUCOSE 113* 99 108* 89  BUN 6 6 7 6   CREATININE 0.43* 0.48 0.61 0.57  CALCIUM 9.3 9.6 9.6 9.8  MG  --   --   --  1.5*   Estimated Creatinine Clearance: 67.3 mL/min (by C-G formula based on SCr of 0.57 mg/dL). Liver & Pancreas: Recent Labs  Lab 02/27/23 0530 03/02/23 0001 03/03/23 0455  AST 13* 15 19  ALT 7 8 9   ALKPHOS 178* 151* 161*  BILITOT 0.4 0.5 0.3  PROT 6.9 7.0 6.8  ALBUMIN 2.5* 2.6* 2.7*   No results for input(s): "LIPASE", "AMYLASE" in the last 168 hours. No results for input(s): "AMMONIA" in the last 168 hours. Diabetic: No results for input(s): "HGBA1C" in the last 72 hours. Recent Labs  Lab 03/02/23 2058 03/02/23 2357 03/03/23 0423 03/03/23 0907 03/03/23 1344  GLUCAP 92 106* 99 152* 154*   Cardiac Enzymes: No results for input(s): "CKTOTAL", "CKMB", "CKMBINDEX", "TROPONINI" in the last 168 hours. No results for input(s): "PROBNP" in the last 8760 hours. Coagulation Profile: No results for input(s): "INR", "PROTIME" in the last 168 hours. Thyroid Function Tests: No results for input(s): "TSH", "T4TOTAL", "FREET4", "T3FREE", "THYROIDAB" in the last 72 hours. Lipid Profile: No results for input(s):  "CHOL", "HDL", "LDLCALC", "TRIG", "CHOLHDL", "LDLDIRECT" in the last 72 hours. Anemia Panel: No results for input(s): "VITAMINB12", "FOLATE", "FERRITIN", "TIBC", "IRON", "RETICCTPCT" in the last 72 hours. Urine analysis:    Component Value Date/Time   COLORURINE AMBER (A) 01/27/2023 0514   APPEARANCEUR HAZY (A) 01/27/2023 0514   LABSPEC 1.020 01/27/2023 0514   PHURINE 5.0 01/27/2023 0514   GLUCOSEU NEGATIVE 01/27/2023 0514   GLUCOSEU NEG mg/dL 69/62/9528 4132   HGBUR NEGATIVE 01/27/2023 0514   BILIRUBINUR NEGATIVE 01/27/2023 0514   KETONESUR 5 (A) 01/27/2023 0514   PROTEINUR 30 (A) 01/27/2023 0514   UROBILINOGEN 0.2 12/26/2013 1530   NITRITE NEGATIVE 01/27/2023 0514   LEUKOCYTESUR TRACE (A) 01/27/2023 0514   Sepsis Labs: Invalid input(s): "PROCALCITONIN", "LACTICIDVEN"  Microbiology: Recent Results (from the past 240 hour(s))  Culture, body fluid w Gram Stain-bottle     Status: None (Preliminary result)   Collection Time: 02/27/23  1:00 PM   Specimen: Pleura  Result Value Ref Range Status   Specimen Description PLEURAL  Final   Special Requests LEFT  Final   Culture   Final    NO GROWTH 4 DAYS Performed at [facility], [address] [place], [place] [address]    Report Status PENDING  Incomplete  Gram stain     Status: None   Collection Time: 02/27/23  1:00 PM   Specimen: Pleura  Result Value Ref Range Status   Specimen Description PLEURAL  Final   Special Requests LEFT  Final   Gram Stain   Final    FEW WBC SEEN NO ORGANISMS SEEN Performed at [facility], [address], [place], [place] [address]    Report Status 02/27/2023 FINAL  Final    Radiology Studies: No results found.    Vega Withrow T. Krisalyn Yankowski [facility]  If 7PM-7AM, please contact night-coverage [email or web address] 03/03/2023, 2:31 PM

## 2023-03-03 NOTE — Plan of Care (Signed)

## 2023-03-03 NOTE — Plan of Care (Signed)

## 2023-03-03 NOTE — Consult Note (Signed)
WOC Nurse wound follow up; visit made with E. Simaan, PA CCS  Wound type: full thickness surgical midline  Measurement: see 02/28/2023 note  Wound ZOX:WRUEA red  Drainage (amount, consistency, odor) moderate serosanguinous  Periwound: intact, ileostomy RLQ; JP drain LLQ  Dressing procedure/placement/frequency: PA assessed wound after NPWT removed and wound cleaned with saline. Decision made to DC NPWT and begin daily wet to dry dressings. NS wet to dry dressing placed at this time by [facility] RN.    Discussed with patient that she has to learn to care for ileostomy. She had breakfast in room at time and deferred any teaching. I will come back later in shift for ileostomy care.   Thank you,    Priscella Mann MSN, RN-BC, [employer] [phone]

## 2023-03-03 NOTE — Progress Notes (Signed)
Occupational Therapy Treatment Patient Details Name: [name] MRN: [id number] DOB: 1970-09-24 Today's Date: 03/03/2023   History of present illness 52 y.o. female presents to [facility] hospital on 01/27/2023 after being found unresponsive at home down in a pool of bloody vomit/stool. CPR was initiated upon arrival to ED, pt intubated and started on pressors. Pt undergoing management for inflammatory vs infections colitis. Pt extubated on 10/2. 10/10 underwent repeat proctosigmoidoscopy for ongoing diarrhea. Diffuse severe inflammation identified due to combination of Crohn's disease, active C. difficile infection and possible ischemic colitis from prior hypotension on presentation. 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 10/13 became tachycardic and hypotensive.  More lethargic and transferred back to ICU. On 10/15, blood cultures revealed fungemia. On 10/17, CT of abdomen and pelvis notable for septic emboli. Successful Left subclavian CVC placement with IR on 10/21. PMH includes COPD, Crohn's disease, HTN, DMII.   OT comments  OT session focused on UB ADLs and bed mobility during ADLs. Pt declined sitting EOB or OOB activity this session secondary to increased pain and fatigue following removal of wound vac earlier this day. Pt participated well from bed level this session and is making progress toward goals. Pt currently demonstrates ability to complete UB ADLs with Set up to Min assist and rolling in the bed largely with CGA with occasional Min assist needed this day due to increased pain with movement. Pt will benefit from continued acute skilled OT services to address deficits outlined below and increased safety and independence with functional tasks. Post acute discharge, pt will benefit form intensive inpatient skilled rehab services > 3 hour sper day to maximize rehab potential.       If plan is discharge home, recommend the following:  A little help with walking and/or  transfers;A lot of help with bathing/dressing/bathroom;Assistance with cooking/housework;Direct supervision/assist for medications management;Direct supervision/assist for financial management;Assist for transportation;Help with stairs or ramp for entrance   Equipment Recommendations  Other (comment) (defer to next level of care)    Recommendations for Other Services      Precautions / Restrictions Precautions Precautions: Fall Precaution Comments: watch HR/RR, bulb drain L, new colostomy; abdominal wound vac removed 10/31 Restrictions Weight Bearing Restrictions: No       Mobility Bed Mobility Overal bed mobility: Needs Assistance Bed Mobility: Rolling Rolling: Contact guard assist, Min assist         General bed mobility comments: Largely CGS for rolling with occasional Min assist this day due to increased pain with movement.    Transfers Overall transfer level: Needs assistance                 General transfer comment: Pt declined sitting EOB or OOB activity this session secondary to fatigue and pain following removal of wound vac earlier this day.     Balance                                           ADL either performed or assessed with clinical judgement   ADL Overall ADL's : Needs assistance/impaired Eating/Feeding: Set up;Sitting;Bed level   Grooming: Wash/dry hands;Wash/dry face;Oral care;Sitting;Bed level   Upper Body Bathing: Minimal assistance;Bed level;Sitting       Upper Body Dressing : Contact guard assist;Sitting;Bed level                     General ADL Comments: Pt  able to perform UB ADLs sitting EOB. However, UB ADLs completed from bed level this session secondary to pt with increased pain in abdomen and fatigue following removal of wound vac earlier this day. Pt fatiguing quickly during session.    Extremity/Trunk Assessment Upper Extremity Assessment Upper Extremity Assessment: Generalized weakness   Lower  Extremity Assessment Lower Extremity Assessment: Defer to PT evaluation        Vision       Perception     Praxis      Cognition Arousal: Alert Behavior During Therapy: [facility] for tasks assessed/performed Overall Cognitive Status: Within Functional Limits for tasks assessed                                 General Comments: AAOx4 and pleasant throughout session. Demonstrates ability to follow 2-step instructions consistently and ability to sequence 5 step task appropriately without cues.        Exercises      Shoulder Instructions       General Comments      Pertinent Vitals/ Pain       Pain Assessment Pain Assessment: Faces Faces Pain Scale: Hurts even more (6 at rest, up to 8 briefly with rolling in bed) Pain Location: abdomen (at site of wound vac removal) Pain Descriptors / Indicators: Discomfort, Grimacing, Guarding Pain Intervention(s): Limited activity within patient's tolerance, Monitored during session, Repositioned, Other (comment) (Notified RN)  Home Living                                          Prior Functioning/Environment              Frequency  Min 1X/week        Progress Toward Goals  OT Goals(current goals can now be found in the care plan section)  Progress towards OT goals: Progressing toward goals  Acute Rehab OT Goals Patient Stated Goal: to have less pain and more energy to do things  Plan      Co-evaluation                 AM-PAC OT "6 Clicks" Daily Activity     Outcome Measure   Help from another person eating meals?: A Little Help from another person taking care of personal grooming?: A Little Help from another person toileting, which includes using toliet, bedpan, or urinal?: Total Help from another person bathing (including washing, rinsing, drying)?: A Lot Help from another person to put on and taking off regular upper body clothing?: A Little Help from another person to put on  and taking off regular lower body clothing?: Total 6 Click Score: 13    End of Session    OT Visit Diagnosis: Muscle weakness (generalized) (M62.81);Other (comment) (decreased activity tolerance)   Activity Tolerance Patient limited by fatigue;Patient limited by pain   Patient Left in bed;with call bell/phone within reach;with bed alarm set   Nurse Communication Mobility status;Other (comment) (Pain level; Pt declined sitting EOB and OOB activity this session but participated well from bed level.)        Time: 1610-9604 OT Time Calculation (min): 27 min  Charges: OT General Charges $OT Visit: 1 Visit OT Treatments $Self Care/Home Management : 23-37 mins  Kamani Magnussen "Orson Eva., OTR/L, MA Acute Rehab [phone]   Lendon Colonel 03/03/2023, 5:28  PM

## 2023-03-03 NOTE — Progress Notes (Signed)
Mobility Specialist Progress Note:   03/03/23 1152  Mobility  Activity Ambulated with assistance in room  Level of Assistance Contact guard assist, steadying assist  Assistive Device Front wheel walker  Distance Ambulated (ft) 30 ft  Activity Response Tolerated fair  Mobility Referral Yes  $Mobility charge 1 Mobility  Mobility Specialist Start Time (ACUTE ONLY) 1105  Mobility Specialist Stop Time (ACUTE ONLY) 1120  Mobility Specialist Time Calculation (min) (ACUTE ONLY) 15 min   Pre Mobility: 113 HR During Mobility: 124-136 HR  Post Mobility: 114 HR  Pt received in bed, agreeable to mobility. HHA to stand. CG during ambulation. Pt agreeable to hallway ambulation if tolerated. Pt able to ambulate to door but c/o dizziness requesting to return back to bed. Rated 6/10. Pt returned to bed with call bell in reach and all needs met. RN notified.   Leory Plowman  Mobility Specialist Please contact via [employer] office at [phone]

## 2023-03-04 ENCOUNTER — Other Ambulatory Visit (HOSPITAL_COMMUNITY): Payer: Self-pay

## 2023-03-04 DIAGNOSIS — I469 Cardiac arrest, cause unspecified: Secondary | ICD-10-CM | POA: Diagnosis not present

## 2023-03-04 DIAGNOSIS — B49 Unspecified mycosis: Secondary | ICD-10-CM | POA: Diagnosis not present

## 2023-03-04 DIAGNOSIS — A0472 Enterocolitis due to Clostridium difficile, not specified as recurrent: Secondary | ICD-10-CM | POA: Diagnosis not present

## 2023-03-04 DIAGNOSIS — D62 Acute posthemorrhagic anemia: Secondary | ICD-10-CM | POA: Diagnosis not present

## 2023-03-04 LAB — GLUCOSE, CAPILLARY
Glucose-Capillary: 100 mg/dL — ABNORMAL HIGH (ref 70–99)
Glucose-Capillary: 102 mg/dL — ABNORMAL HIGH (ref 70–99)
Glucose-Capillary: 103 mg/dL — ABNORMAL HIGH (ref 70–99)
Glucose-Capillary: 115 mg/dL — ABNORMAL HIGH (ref 70–99)
Glucose-Capillary: 115 mg/dL — ABNORMAL HIGH (ref 70–99)
Glucose-Capillary: 126 mg/dL — ABNORMAL HIGH (ref 70–99)
Glucose-Capillary: 91 mg/dL (ref 70–99)
Glucose-Capillary: 93 mg/dL (ref 70–99)

## 2023-03-04 LAB — BASIC METABOLIC PANEL
Anion gap: 11 (ref 5–15)
BUN: <5 mg/dL — ABNORMAL LOW (ref 6–20)
CO2: 26 mmol/L (ref 22–32)
Calcium: 9.7 mg/dL (ref 8.9–10.3)
Chloride: 94 mmol/L — ABNORMAL LOW (ref 98–111)
Creatinine, Ser: 0.53 mg/dL (ref 0.44–1.00)
GFR, Estimated: >60 mL/min (ref >60)
Glucose, Bld: 106 mg/dL — ABNORMAL HIGH (ref 70–99)
Potassium: 4.1 mmol/L (ref 3.5–5.1)
Sodium: 131 mmol/L — ABNORMAL LOW (ref 135–145)

## 2023-03-04 LAB — CBC
HCT: 34.6 % — ABNORMAL LOW (ref 36.0–46.0)
Hemoglobin: 11.1 g/dL — ABNORMAL LOW (ref 12.0–15.0)
MCH: 27.6 pg (ref 26.0–34.0)
MCHC: 32.1 g/dL (ref 30.0–36.0)
MCV: 86.1 fL (ref 80.0–100.0)
Platelets: 1268 10*3/uL (ref 150–400)
RBC: 4.02 MIL/uL (ref 3.87–5.11)
RDW: 15 % (ref 11.5–15.5)
WBC: 12.4 10*3/uL — ABNORMAL HIGH (ref 4.0–10.5)
nRBC: 0 % (ref 0.0–0.2)

## 2023-03-04 LAB — CULTURE, BODY FLUID W GRAM STAIN -BOTTLE: Culture: NO GROWTH

## 2023-03-04 LAB — MAGNESIUM: Magnesium: 1.9 mg/dL (ref 1.7–2.4)

## 2023-03-04 MED ORDER — ACETAMINOPHEN 325 MG PO TABS
650.0000 mg | ORAL_TABLET | Freq: Four times a day (QID) | ORAL | Status: DC
  Administered 2023-03-04 – 2023-03-19 (×51): 650 mg via ORAL
  Filled 2023-03-04 (×53): qty 2

## 2023-03-04 MED ORDER — METOPROLOL TARTRATE 50 MG PO TABS
50.0000 mg | ORAL_TABLET | Freq: Two times a day (BID) | ORAL | Status: DC
  Administered 2023-03-04 – 2023-03-05 (×4): 50 mg via ORAL
  Filled 2023-03-04 (×4): qty 1

## 2023-03-04 MED ORDER — OXYCODONE HCL 5 MG PO TABS
10.0000 mg | ORAL_TABLET | Freq: Four times a day (QID) | ORAL | Status: DC | PRN
  Administered 2023-03-04: 10 mg via ORAL
  Filled 2023-03-04: qty 2

## 2023-03-04 NOTE — Plan of Care (Signed)

## 2023-03-04 NOTE — Progress Notes (Signed)
PROGRESS NOTE  [name] QMV:[id number] DOB: 06-Jun-1970   PCP: [name], PA-C  Patient is from: Home  DOA: 01/27/2023 LOS: 36  Chief complaints Chief Complaint  Patient presents with   Respiratory Arrest     Brief Narrative / Interim history: 52 year old female with past medical history of COPD, Crohn's on Skyrizi, hypertension, type 2 diabetes mellitus who initially presented to the after being found unresponsive. Per family, patient has had bloody emesis with diarrhea over the past few days. Initial systolics in the 60s in the ED, and patient was minimally responsive. In the emergency room, patient was intubated, and patient was admitted to the ICU for further evaluation and management for shock and altered mental status.    01/27/2023: Admitted to ICU and intubated.  Felt to have septic shock of unclear source which resolved antibiotic therapy and fluid resuscitation.  Suspicion was that she may have had C. difficile colitis despite negative toxin.  She was treated with vancomycin. 9/29 Off pressors 10/2: Extubated 10/3 transfer to floor. 10/10 underwent repeat proctosigmoidoscopy for ongoing diarrhea.  Diffuse severe inflammation identified due to combination of Crohn's disease, active C. difficile infection and possible ischemic colitis from prior hypotension on presentation. 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  Intraoperative findings include frank necrosis with pelvic abscess formation and significant colitis. 10/12 some dilated small bowel with minimal OG output.  Continued observation advised.  Having significant postoperative pain. 10/13 became tachycardic and hypotensive.  More lethargic.  Transferred back to ICU. 10/15 blood cultures reports fungemia, resume PO vancomycin 10/17 CT abdomen pelvis notable for septic emboli - CTA chest on hold given IV access issues 10/18 TEE negative for endocarditis/bubble study - NG out for TEE probe.  Advance  diet, hoping to avoid replacement of NG tube 10/19 wound VAC dysfunctional, surgery switching to wet-to-dry over the weekend -pain poorly controlled today, add oxycodone (medication lists this is an allergy but patient verbally approved) 10/20 pain control improving, transition from IV fentanyl to Dilaudid, continue p.o. oxycodone, restart home anxiolytics with Xanax 10/21 Successful left subclavian CVC placement with IR Therapy recommended CIR.  Subjective: Seen and examined earlier this morning.  Patient reports palpitation and chest pain after she felt upset this morning.  Chest pain was mainly on the left side.  EKG obtained and showed sinus tachycardia in 120s.  No signs of acute ischemic finding.  She also reports left-sided headache that she describes as pounding.  She reports migraine as a child.  She denies photophobia or phonophobia.  No focal neurosymptoms.  Objective: Vitals:   03/04/23 0804 03/04/23 0847 03/04/23 0947 03/04/23 1300  BP: 121/61  (!) 117/50 (!) 124/47  Pulse: (!) 122 (!) 125 (!) 120 (!) 103  Resp: 12  16 16   Temp: 97.8 F (36.6 C)  97.9 F (36.6 C) 97.9 F (36.6 C)  TempSrc: Oral  Oral Oral  SpO2: 97%  95% 98%  Weight:      Height:        Examination:  GENERAL: No apparent distress.  Nontoxic. HEENT: MMM.  Vision and hearing grossly intact.  No tenderness with palpation over temporal regions. NECK: Supple.  No apparent JVD.  RESP:  No IWOB.  Fair aeration bilaterally. CVS: HR 110s.  Regular rhythm.  Heart sounds normal.  ABD/GI/GU: BS+.  Dressing over laparotomy wound DCI.  Ileostomy bag to RLQ.  JP drain to LLQ. MSK/EXT:  Moves extremities. No apparent deformity. No edema.  SKIN: no apparent skin lesion or  wound NEURO: Awake, alert and oriented appropriately.  No apparent focal neuro deficit. PSYCH: Calm. Normal affect.    Microbiology summarized: 9/26-COVID-19, influenza and RSV PCR nonreactive 9/26 and 10/13-MRSA PCR screen negative 9/26-blood  culture with staph hominis in 1 out of 2 bottles likely contaminant 9/29-C. difficile PCR positive 9/29-GIP negative 10/12-blood culture with Candida glabrata in 1 out of 2 bottles 10/17-blood cultures NGTD 10/27-pleural fluid culture NGTD  Assessment and plan: Septic shock, multifactorial-including C. difficile colitis, fungemia, intra-abdominal abscess Fungemia, candida glabrata, likely POA Cdiff Colitis: POA Septic emboli, thrombocytosis Intra-abdominal infection/abscess -Culture data as above. -S/p laparotomy with total colectomy with ileostomy and LLQ JP drain -Received ceftriaxone and Flagyl for extended course until 10/28. -TTE/TEE negative for vegetation.  Bubble study negative. -Appreciate input by ID -Continue antifungal for 6 weeks for fungemia.  Continue micafungin pending sensitivity -Complete 14 days of p.o. vancomycin followed by taper for C. difficile -Ophthalmology or neuro consultation.  -Evaluated by ophthalmology on 10/23.  No evidence of fungal infiltrate on funduscopic evaluation -Left subclavian tunneled CVC placed 02/21/2023 with IR -Diarrhea and leukocytosis improving.  Diarrhea partly due to colectomy. -Wound care per general surgery and wound care staff.  WTD daily -Receiving oxycodone and IV Dilaudid almost around-the-clock in addition to fentanyl patch.  Discussed the importance of cutting down on IV pain meds before transfer to rehab.    Acute pulmonary emboli/right upper extremity DVT -Continue Lovenox   Moderate to severe malnutrition Postoperative short bowel syndrome/high output ostomy, improving Malabsorption likely acute on chronic -Encourage oral intake. -Optimize nutrition -IVF as needed -Not a candidate for TPN due to fungemia   Left-sided pleural effusion/iatrogenic pneumothorax, small: S/p thoracocentesis on 10/27.  Fluid is exudative by LDH.  Fluid culture NGTD.  Subsequent chest x-ray on 10/28 without pneumothorax.  Cytology  negative. 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  Previous provider discussed with Dr. Cardell Peach with urology who recommended outpatient evaluation with cystoscopy  Intractable pain, multifactorial Left-sided chest pain: Aggravated by stress.  Also reproducible.  EKG without acute ischemic finding. Left-sided headache-reports child for migraine.  She has no photophobia or phonophobia.  No focal neurodeficit. -On fentanyl patch, p.o. oxycodone and IV Dilaudid.  Has been receiving oxycodone and IV Dilaudid around-the-clock. -Discussed the importance of weaning of IV pain medication as early as possible.   Thrombocytosis: Evaluated by hematology and felt to be inflammatory response.  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   Recent Labs  Lab 02/26/23 0405 02/27/23 0530 02/28/23 0645 03/01/23 0445 03/02/23 0001 03/03/23 0455 03/04/23 0530  PLT 1,095* 1,064* 1,039* 937* 969* 1,082* 1,268*  -Continue monitoring -Outpatient follow-up   Longstanding Crohn's disease: Not chronically on steroids so less concern for adrenal insufficiency. S/p colectomy -Hold steroids/Skyrizi in the setting of ongoing infection.   Sinus tachycardia: Likely due to acute illness and possible dehydration.  TSH normal.  He had some pericardial effusion without tamponade.  Repeat CT on 10/25 did not reveal pericardial effusion. -Increased metoprolol to 50 mg twice daily -IV fluid as needed  Pericardial effusion without tamponade: Initially seen on TTE.  CT angio from 10/25 did not reveal any pericardial effusion.   -May need outpatient echocardiogram to confirm resolution.  Cardiac arrest secondary to septic shock requiring CPR on admission   Controlled DM-2 with hyperglycemia: A1c 5.8% on 9/26. Recent Labs  Lab 03/04/23 0021 03/04/23 0346 03/04/23 0801 03/04/23 0933 03/04/23 1256  GLUCAP 91 93  115* 102* 126*  -Continue current  insulin regimen   History of seizures -Continue PTA keppra/gabapentin   History of anxiety -Resume home Xanax, Cymbalta, hydroxyzine   History of hypertension -Metoprolol as above   History of COPD -Continue xopenex PRN -Switch PTA spiriva to yupelri per forumulary   GERD -Continue PTA PPI   Normocytic Anemia: Stable -Transfuse for Hb <7 or hemodynamically significant bleeding  Hypokalemia/hypomagnesemia -Monitor replenish as appropriate   Inadequate oral intake Body mass index is 23.83 kg/m. Nutrition Problem: Inadequate oral intake Etiology: inability to eat Signs/Symptoms: NPO status Interventions: Refer to RD note for recommendations   DVT prophylaxis:  SCDs Start: 01/27/23 1111  Code Status: Full code Family Communication: None at bedside Level of care: Telemetry Cardiac Status is: Inpatient Remains inpatient appropriate because: Sepsis, tachycardia   Final disposition:  Consultants:  Pulmonology General Surgery Hematology Urology Infectious disease Ophthalmology   55 minutes with more than 50% spent in reviewing records, counseling patient/family and coordinating care.   Sch Meds:  Scheduled Meds:  acetaminophen  650 mg Oral Q6H   amitriptyline  25 mg Oral QHS   Chlorhexidine Gluconate Cloth  6 each Topical Daily   diphenoxylate-atropine  1 tablet Oral BID   DULoxetine  30 mg Oral Daily   enoxaparin (LOVENOX) injection  70 mg Subcutaneous Q12H   feeding supplement  237 mL Oral TID BM   fentaNYL  1 patch Transdermal Q72H   folic acid  1 mg Oral Daily   gabapentin  100 mg Oral TID   Gerhardt's butt cream   Topical BID   insulin aspart  0-9 Units Subcutaneous Q4H   levETIRAcetam  750 mg Oral BID   lidocaine  2 patch Transdermal Q24H   lidocaine (PF)  8 mL Intradermal Once   loperamide  4 mg Oral QID   loratadine  10 mg Oral Daily   methocarbamol  1,000 mg Oral TID   metoprolol tartrate  50 mg Oral BID   multivitamin with minerals  1  tablet Oral Daily   pantoprazole  40 mg Oral Daily   revefenacin  175 mcg Nebulization Daily   thiamine  100 mg Oral Daily   vancomycin  125 mg Oral QID   Followed by   Melene Muller ON 03/15/2023] vancomycin  125 mg Oral BID   Followed by   Melene Muller ON 03/22/2023] vancomycin  125 mg Oral Daily   Followed by   Melene Muller ON 03/29/2023] vancomycin  125 mg Oral QODAY   Followed by   Melene Muller ON 04/06/2023] vancomycin  125 mg Oral Q3 days   Continuous Infusions:  micafungin (MYCAMINE) 150 mg in sodium chloride 0.9 % 100 mL IVPB 150 mg (03/04/23 1046)   PRN Meds:.ALPRAZolam, bismuth subsalicylate, camphor-menthol, dextrose, HYDROmorphone (DILAUDID) injection, levalbuterol, magic mouthwash, metoprolol tartrate, ondansetron (ZOFRAN) IV, mouth rinse, oxyCODONE  Antimicrobials: Anti-infectives (From admission, onward)    Start     Dose/Rate Route Frequency Ordered Stop   04/06/23 1000  vancomycin (VANCOCIN) capsule 125 mg       Placed in "Followed by" Linked Group   125 mg Oral Every 3 DAYS 03/01/23 0750 04/15/23 0959   03/29/23 1000  vancomycin (VANCOCIN) capsule 125 mg       Placed in "Followed by" Linked Group   125 mg Oral Every other day 03/01/23 0750 04/06/23 0959   03/22/23 1000  vancomycin (VANCOCIN) capsule 125 mg       Placed in "Followed by" Linked Group   125 mg Oral  Daily 03/01/23 0750 03/29/23 0959   03/15/23 1000  vancomycin (VANCOCIN) capsule 125 mg       Placed in "Followed by" Linked Group   125 mg Oral 2 times daily 03/01/23 0750 03/22/23 0959   03/01/23 1000  vancomycin (VANCOCIN) capsule 125 mg       Placed in "Followed by" Linked Group   125 mg Oral 4 times daily 03/01/23 0750 03/15/23 0959   02/18/23 1800  vancomycin (VANCOCIN) 50 mg/mL oral solution SOLN 125 mg        125 mg Oral 4 times daily 02/18/23 1439 02/26/23 0959   02/18/23 0400  micafungin (MYCAMINE) 150 mg in sodium chloride 0.9 % 100 mL IVPB        150 mg 107.5 mL/hr over 1 Hours Intravenous Every 24 hours  02/17/23 0936     02/17/23 0900  vancomycin (VANCOREADY) IVPB 750 mg/150 mL  Status:  Discontinued        750 mg 150 mL/hr over 60 Minutes Intravenous Every 8 hours 02/17/23 0204 02/17/23 0947   02/17/23 0400  micafungin (MYCAMINE) 100 mg in sodium chloride 0.9 % 100 mL IVPB  Status:  Discontinued        100 mg 105 mL/hr over 1 Hours Intravenous Every 24 hours 02/16/23 0353 02/17/23 0936   02/16/23 1045  vancomycin (VANCOCIN) 50 mg/mL oral solution SOLN 125 mg  Status:  Discontinued        125 mg Per Tube 4 times daily 02/16/23 0953 02/18/23 1439   02/16/23 0415  micafungin (MYCAMINE) 100 mg in sodium chloride 0.9 % 100 mL IVPB        100 mg 105 mL/hr over 1 Hours Intravenous STAT 02/16/23 0353 02/16/23 0610   02/15/23 1300  vancomycin (VANCOREADY) IVPB 750 mg/150 mL  Status:  Discontinued        750 mg 150 mL/hr over 60 Minutes Intravenous Every 12 hours 02/14/23 1156 02/17/23 0204   02/14/23 1130  vancomycin (VANCOCIN) IVPB 1000 mg/200 mL premix        1,000 mg 200 mL/hr over 60 Minutes Intravenous  Once 02/14/23 1043 02/14/23 1249   02/11/23 2000  cefTRIAXone (ROCEPHIN) 2 g in sodium chloride 0.9 % 100 mL IVPB  Status:  Discontinued       Note to Pharmacy: Pharmacy may adjust dosing strength / duration / interval for maximal efficacy   2 g 200 mL/hr over 30 Minutes Intravenous Every 24 hours 02/11/23 1622 02/28/23 0852   02/10/23 1600  metroNIDAZOLE (FLAGYL) IVPB 500 mg  Status:  Discontinued        500 mg 100 mL/hr over 60 Minutes Intravenous Every 12 hours 02/10/23 1444 02/28/23 0857   02/08/23 1800  vancomycin (VANCOCIN) 50 mg/mL oral solution SOLN 500 mg        500 mg Per Tube Every 6 hours 02/08/23 1325 02/12/23 1759   02/03/23 2200  metroNIDAZOLE (FLAGYL) tablet 500 mg        500 mg Per Tube Every 12 hours 02/03/23 1406 02/06/23 2148   02/02/23 1815  vancomycin (VANCOCIN) 50 mg/mL oral solution SOLN 125 mg  Status:  Discontinued        125 mg Per Tube Every 6 hours 02/02/23  1717 02/08/23 1325   02/02/23 1545  vancomycin (VANCOCIN) capsule 125 mg  Status:  Discontinued        125 mg Oral 4 times daily 02/02/23 1530 02/02/23 1717   01/27/23 1430  cefTRIAXone (ROCEPHIN) 2 g in  sodium chloride 0.9 % 100 mL IVPB        2 g 200 mL/hr over 30 Minutes Intravenous Every 24 hours 01/27/23 1340 02/05/23 1403   01/27/23 1400  metroNIDAZOLE (FLAGYL) IVPB 500 mg  Status:  Discontinued        500 mg 100 mL/hr over 60 Minutes Intravenous Every 12 hours 01/27/23 1340 02/03/23 1406   01/27/23 0545  vancomycin (VANCOREADY) IVPB 1250 mg/250 mL        1,250 mg 166.7 mL/hr over 90 Minutes Intravenous  Once 01/27/23 0542 01/27/23 0809   01/27/23 0545  ceFEPIme (MAXIPIME) 2 g in sodium chloride 0.9 % 100 mL IVPB        2 g 200 mL/hr over 30 Minutes Intravenous  Once 01/27/23 0542 01/27/23 0621   01/27/23 0545  metroNIDAZOLE (FLAGYL) IVPB 500 mg        500 mg 100 mL/hr over 60 Minutes Intravenous  Once 01/27/23 0542 01/27/23 0741        I have personally reviewed the following labs and images: CBC: Recent Labs  Lab 02/28/23 0645 03/01/23 0445 03/02/23 0001 03/03/23 0455 03/04/23 0530  WBC 16.8* 15.1* 12.0* 11.1* 12.4*  NEUTROABS  --   --   --  6.8  --   HGB 10.5* 10.2* 10.1* 10.5* 11.1*  HCT 33.2* 32.5* 31.8* 32.9* 34.6*  MCV 86.7 86.0 85.5 86.6 86.1  PLT 1,039* 937* 969* 1,082* 1,268*   BMP &GFR Recent Labs  Lab 02/27/23 0530 03/02/23 0001 03/03/23 0455 03/04/23 0530  NA 133* 131* 131* 131*  K 4.0 4.2 3.9 4.1  CL 93* 94* 93* 94*  CO2 27 26 26 26   GLUCOSE 99 108* 89 106*  BUN 6 7 6  <5*  CREATININE 0.48 0.61 0.57 0.53  CALCIUM 9.6 9.6 9.8 9.7  MG  --   --  1.5* 1.9   Estimated Creatinine Clearance: 67.3 mL/min (by C-G formula based on SCr of 0.53 mg/dL). Liver & Pancreas: Recent Labs  Lab 02/27/23 0530 03/02/23 0001 03/03/23 0455  AST 13* 15 19  ALT 7 8 9   ALKPHOS 178* 151* 161*  BILITOT 0.4 0.5 0.3  PROT 6.9 7.0 6.8  ALBUMIN 2.5* 2.6* 2.7*    No results for input(s): "LIPASE", "AMYLASE" in the last 168 hours. No results for input(s): "AMMONIA" in the last 168 hours. Diabetic: No results for input(s): "HGBA1C" in the last 72 hours. Recent Labs  Lab 03/04/23 0021 03/04/23 0346 03/04/23 0801 03/04/23 0933 03/04/23 1256  GLUCAP 91 93 115* 102* 126*   Cardiac Enzymes: No results for input(s): "CKTOTAL", "CKMB", "CKMBINDEX", "TROPONINI" in the last 168 hours. No results for input(s): "PROBNP" in the last 8760 hours. Coagulation Profile: No results for input(s): "INR", "PROTIME" in the last 168 hours. Thyroid Function Tests: No results for input(s): "TSH", "T4TOTAL", "FREET4", "T3FREE", "THYROIDAB" in the last 72 hours. Lipid Profile: No results for input(s): "CHOL", "HDL", "LDLCALC", "TRIG", "CHOLHDL", "LDLDIRECT" in the last 72 hours. Anemia Panel: No results for input(s): "VITAMINB12", "FOLATE", "FERRITIN", "TIBC", "IRON", "RETICCTPCT" in the last 72 hours. Urine analysis:    Component Value Date/Time   COLORURINE AMBER (A) 01/27/2023 0514   APPEARANCEUR HAZY (A) 01/27/2023 0514   LABSPEC 1.020 01/27/2023 0514   PHURINE 5.0 01/27/2023 0514   GLUCOSEU NEGATIVE 01/27/2023 0514   GLUCOSEU NEG mg/dL 95/62/1308 6578   HGBUR NEGATIVE 01/27/2023 0514   BILIRUBINUR NEGATIVE 01/27/2023 0514   KETONESUR 5 (A) 01/27/2023 0514   PROTEINUR 30 (A) 01/27/2023 4696  UROBILINOGEN 0.2 12/26/2013 1530   NITRITE NEGATIVE 01/27/2023 0514   LEUKOCYTESUR TRACE (A) 01/27/2023 0514   Sepsis Labs: Invalid input(s): "PROCALCITONIN", "LACTICIDVEN"  Microbiology: Recent Results (from the past 240 hour(s))  Culture, body fluid w Gram Stain-bottle     Status: None   Collection Time: 02/27/23  1:00 PM   Specimen: Pleura  Result Value Ref Range Status   Specimen Description PLEURAL  Final   Special Requests LEFT  Final   Culture   Final    NO GROWTH 5 DAYS Performed at [facility], [address], [place], [place] [address]     Report Status 03/04/2023 FINAL  Final  Gram stain     Status: None   Collection Time: 02/27/23  1:00 PM   Specimen: Pleura  Result Value Ref Range Status   Specimen Description PLEURAL  Final   Special Requests LEFT  Final   Gram Stain   Final    FEW WBC SEEN NO ORGANISMS SEEN Performed at [facility], [address], [place], [place] [address]    Report Status 02/27/2023 FINAL  Final    Radiology Studies: No results found.    Loletta Harper T. Arielys [facility]  If 7PM-7AM, please contact night-coverage [email or web address] 03/04/2023, 2:22 PM

## 2023-03-04 NOTE — Progress Notes (Signed)
[facility]  Date of Admission:  01/27/2023     Principal Problem:   Fungemia Active Problems:   Shock (HCC)   Metabolic encephalopathy   Cardiac arrest (HCC)   Acute respiratory failure with hypoxia (HCC)   Upper GI bleed   Granulomatous colitis (HCC)   Pseudomembranous colitis   Crohn's disease without complication (HCC)   Ileus (HCC)   C. 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  TEE did not show vegetations.  Repeat blood cultures on 10/17 remain negative. - Central line placed on 10/21 -Upper extremity and lower extremity Dopplers are negative #Intrabdominal infection Repeat CT is reassuring few locules of free air in abdomen likely postoperative.  Continue ceftriaxone and metronidazole for now -CTA chest on 10/25 showed stable b/l embolic, RLL opacity. No respiratory complaints and on RA. -Optho note no fungal infiltrates on DFE -Repeat CT AP on 10/25 noted near complete resolution of intracranial gas only a tiny loculation of gas in right abdomen.  Reduce scattered fluid collection along mesentery.  Slightly reduced hazy stranding in adipose tissues.  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  Dilated small bowel and air levels extending to margin of enterostomy favoring ileus orientations.  Reduced size conspicuity of previous splenic infarcts.  #C diff illietis - On 9/29 C. difficile antigen and PCR positive, toxin negative -Diarrhea is improving.  Continue p.o. vancomycin for now  Recommendations: -Continue micafungin, follow-up with  Candida glabrata sensitivities.  Needs to be seen by ophthalmology or neuro in setting of candidemia.  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 Reginia Forts) recommends OP eval with cystoscopy Microbiology:   Antibiotics: Ceftriaxone, metronidazole, micafungin, p.o. vancomycin  SUBJECTIVE: Resting in bed.  Reports diarrhea is improving. Interval: Afebrile overnight, WBC 12K  Review of Systems: Review of Systems  All other systems reviewed and are negative.    Scheduled Meds:  acetaminophen  650 mg Oral Q6H   amitriptyline  25 mg Oral QHS   Chlorhexidine Gluconate Cloth  6 each Topical Daily   diphenoxylate-atropine  1 tablet Oral BID   DULoxetine  30 mg Oral Daily   enoxaparin (LOVENOX) injection  70 mg Subcutaneous Q12H   feeding supplement  237 mL Oral TID BM   fentaNYL  1 patch Transdermal Q72H   folic acid  1 mg Oral Daily   gabapentin  100 mg Oral TID   Gerhardt's butt cream   Topical BID   insulin aspart  0-9 Units Subcutaneous Q4H   levETIRAcetam  750 mg Oral BID   lidocaine  2 patch Transdermal Q24H   lidocaine (PF)  8 mL Intradermal Once   loperamide  4 mg Oral QID   loratadine  10 mg Oral Daily   methocarbamol  1,000 mg Oral TID   metoprolol tartrate  50 mg Oral BID   multivitamin  with minerals  1 tablet Oral Daily   pantoprazole  40 mg Oral Daily   revefenacin  175 mcg Nebulization Daily   thiamine  100 mg Oral Daily   vancomycin  125 mg Oral QID   Followed by   Melene Muller ON 03/15/2023] vancomycin  125 mg Oral BID   Followed by   Melene Muller ON 03/22/2023] vancomycin  125 mg Oral Daily   Followed by   Melene Muller ON 03/29/2023] vancomycin  125 mg Oral QODAY   Followed by   Melene Muller ON 04/06/2023] vancomycin  125 mg Oral Q3 days   Continuous Infusions:   micafungin (MYCAMINE) 150 mg in sodium chloride 0.9 % 100 mL IVPB 150 mg (03/03/23 0451)   PRN Meds:.ALPRAZolam, bismuth subsalicylate, camphor-menthol, dextrose, HYDROmorphone (DILAUDID) injection, levalbuterol, magic mouthwash, metoprolol tartrate, ondansetron (ZOFRAN) IV, mouth rinse, oxyCODONE Allergies  Allergen Reactions   Codeine Shortness Of Breath, Swelling and Rash    Throat swelling   Hydrocodone Shortness Of Breath, Swelling and Rash   Ketorolac Nausea And Vomiting and Swelling    Pt reports n/v and swelling to throat   Morphine And Codeine Shortness Of Breath, Swelling and Rash   Penicillins Shortness Of Breath, Swelling and Other (See Comments)    Throat swells 02/06/21--TOLERATES CEFTRIAXONE     Nickel Rash   Pregabalin Other (See Comments)   Acetaminophen     Per MD patient states she can't take Tylenol because of liver enzymes   Atarax [Hydroxyzine] Itching and Swelling    Mildly swollen throat   Ativan [Lorazepam] Other (See Comments)    Migraines.   Strawberry Extract Swelling   Watermelon Concentrate Swelling   Albuterol Palpitations   Benadryl [Diphenhydramine Hcl] Palpitations   Humira [Adalimumab] Rash   Latex Rash   Remicade [Infliximab] Rash    Blisters and Welts    Tramadol Rash    Rash and itching    OBJECTIVE: Vitals:   03/04/23 0654 03/04/23 0757 03/04/23 0804 03/04/23 0847  BP:   121/61   Pulse:   (!) 122 (!) 125  Resp: 11 13 12    Temp:   97.8 F (36.6 C)   TempSrc:   Oral   SpO2:   97%   Weight: 61 kg     Height:       Body mass index is 23.83 kg/m.  Physical Exam Constitutional:      Appearance: Normal appearance.  HENT:     Head: Normocephalic and atraumatic.     Right Ear: Tympanic membrane normal.     Left Ear: Tympanic membrane normal.     Nose: Nose normal.     Mouth/Throat:     Mouth: Mucous membranes are moist.  Eyes:     Extraocular Movements: Extraocular movements intact.     Conjunctiva/sclera: Conjunctivae  normal.     Pupils: Pupils are equal, round, and reactive to light.  Cardiovascular:     Rate and Rhythm: Normal rate and regular rhythm.     Heart sounds: No murmur heard.    No friction rub. No gallop.  Pulmonary:     Effort: Pulmonary effort is normal.     Breath sounds: Normal breath sounds.  Musculoskeletal:        General: Normal range of motion.  Skin:    General: Skin is warm and dry.  Neurological:     General: No focal deficit present.     Mental Status: She is alert and oriented to person, place, and time.  Psychiatric:  Mood and Affect: Mood normal.   RUQ colostomy LLQ drain    Lab Results Lab Results  Component Value Date   WBC 12.4 (H) 03/04/2023   HGB 11.1 (L) 03/04/2023   HCT 34.6 (L) 03/04/2023   MCV 86.1 03/04/2023   PLT 1,268 (HH) 03/04/2023    Lab Results  Component Value Date   CREATININE 0.53 03/04/2023   BUN <5 (L) 03/04/2023   NA 131 (L) 03/04/2023   K 4.1 03/04/2023   CL 94 (L) 03/04/2023   CO2 26 03/04/2023    Lab Results  Component Value Date   ALT 9 03/03/2023   AST 19 03/03/2023   ALKPHOS 161 (H) 03/03/2023   BILITOT 0.3 03/03/2023        Danelle Earthly, MD [facility] 03/04/2023, 8:57 AM I have personally spent 35 minutes involved in face-to-face and non-face-to-face activities for this patient on the day of the visit. Professional time spent includes the following activities: Preparing to see the patient (review of tests), Obtaining and/or reviewing separately obtained history (admission/discharge record), Performing a medically appropriate examination and/or evaluation , Ordering medications/tests/procedures, referring and communicating with other health care professionals, Documenting clinical information in the EMR, Independently interpreting results (not separately reported), Communicating results to the patient/family/caregiver, Counseling and educating the  patient/family/caregiver and Care coordination (not separately reported).

## 2023-03-04 NOTE — Progress Notes (Signed)
PT Cancellation Note  Patient Details Name: [name] MRN: [id number] DOB: [birth year]   Cancelled Treatment:    Reason Eval/Treat Not Completed: (P) Fatigue/lethargy limiting ability to participate (Pt reports feeling fatigued and requests to rest. Will continue to follow per PT POC.)   [name] 03/04/2023, 3:09 PM

## 2023-03-04 NOTE — TOC Benefit Eligibility Note (Signed)
Patient Product/process development scientist completed.    The patient is insured through [facility].     Ran test claim for Cresemba 186 mg and Requires Prior Authorization   This test claim was processed through [employer]- copay amounts may vary at other pharmacies due to [employer], or as the patient moves through the different stages of their insurance plan.     Roland Earl, CPHT Pharmacy Technician III Certified Patient Advocate [facility] Patient Advocate Team Direct Number: [phone]  Fax: [phone]

## 2023-03-04 NOTE — Consult Note (Signed)
Attempted visit for ileostomy pouch change and education.   Patient deferred, said she would do it "next week".  Pouch last changed by [facility] 02/28/2023. Patient has thus far been unwilling to participate in any education/training on care of ileostomy.  WOC team will continue to follow for attempts at teaching.   Thank you,    Priscella Mann MSN, RN-BC, [employer] [phone]

## 2023-03-05 DIAGNOSIS — A0472 Enterocolitis due to Clostridium difficile, not specified as recurrent: Secondary | ICD-10-CM | POA: Diagnosis not present

## 2023-03-05 DIAGNOSIS — B49 Unspecified mycosis: Secondary | ICD-10-CM | POA: Diagnosis not present

## 2023-03-05 DIAGNOSIS — I469 Cardiac arrest, cause unspecified: Secondary | ICD-10-CM | POA: Diagnosis not present

## 2023-03-05 DIAGNOSIS — D62 Acute posthemorrhagic anemia: Secondary | ICD-10-CM | POA: Diagnosis not present

## 2023-03-05 LAB — MAGNESIUM: Magnesium: 1.6 mg/dL — ABNORMAL LOW (ref 1.7–2.4)

## 2023-03-05 LAB — CBC
HCT: 35.4 % — ABNORMAL LOW (ref 36.0–46.0)
Hemoglobin: 11.3 g/dL — ABNORMAL LOW (ref 12.0–15.0)
MCH: 27.2 pg (ref 26.0–34.0)
MCHC: 31.9 g/dL (ref 30.0–36.0)
MCV: 85.3 fL (ref 80.0–100.0)
Platelets: 1212 10*3/uL (ref 150–400)
RBC: 4.15 MIL/uL (ref 3.87–5.11)
RDW: 15.2 % (ref 11.5–15.5)
WBC: 10.5 10*3/uL (ref 4.0–10.5)
nRBC: 0 % (ref 0.0–0.2)

## 2023-03-05 LAB — RENAL FUNCTION PANEL
Albumin: 3 g/dL — ABNORMAL LOW (ref 3.5–5.0)
Anion gap: 16 — ABNORMAL HIGH (ref 5–15)
BUN: 6 mg/dL (ref 6–20)
CO2: 24 mmol/L (ref 22–32)
Calcium: 10.1 mg/dL (ref 8.9–10.3)
Chloride: 90 mmol/L — ABNORMAL LOW (ref 98–111)
Creatinine, Ser: 0.54 mg/dL (ref 0.44–1.00)
GFR, Estimated: >60 mL/min (ref >60)
Glucose, Bld: 113 mg/dL — ABNORMAL HIGH (ref 70–99)
Phosphorus: 4.3 mg/dL (ref 2.5–4.6)
Potassium: 3.6 mmol/L (ref 3.5–5.1)
Sodium: 130 mmol/L — ABNORMAL LOW (ref 135–145)

## 2023-03-05 LAB — GLUCOSE, CAPILLARY
Glucose-Capillary: 94 mg/dL (ref 70–99)
Glucose-Capillary: 117 mg/dL — ABNORMAL HIGH (ref 70–99)
Glucose-Capillary: 108 mg/dL — ABNORMAL HIGH (ref 70–99)
Glucose-Capillary: 115 mg/dL — ABNORMAL HIGH (ref 70–99)
Glucose-Capillary: 94 mg/dL (ref 70–99)

## 2023-03-05 MED ORDER — MAGNESIUM SULFATE 2 GM/50ML IV SOLN
2.0000 g | Freq: Once | INTRAVENOUS | Status: AC
  Administered 2023-03-05: 2 g via INTRAVENOUS
  Filled 2023-03-05: qty 50

## 2023-03-05 MED ORDER — SODIUM CHLORIDE 0.9 % IV BOLUS
500.0000 mL | Freq: Once | INTRAVENOUS | Status: AC
  Administered 2023-03-05: 500 mL via INTRAVENOUS

## 2023-03-05 NOTE — Progress Notes (Signed)
PROGRESS NOTE  [name] ZOX:[id number] DOB: Sep 02, 1970   PCP: [name], PA-C  Patient is from: Home  DOA: 01/27/2023 LOS: 37  Chief complaints Chief Complaint  Patient presents with   Respiratory Arrest     Brief Narrative / Interim history: 52 year old female with past medical history of COPD, Crohn's on Skyrizi, hypertension, type 2 diabetes mellitus who initially presented to the after being found unresponsive. Per family, patient has had bloody emesis with diarrhea over the past few days. Initial systolics in the 60s in the ED, and patient was minimally responsive. In the emergency room, patient was intubated, and patient was admitted to the ICU for further evaluation and management for shock and altered mental status.    01/27/2023: Admitted to ICU and intubated.  Felt to have septic shock of unclear source which resolved antibiotic therapy and fluid resuscitation.  Suspicion was that she may have had C. difficile colitis despite negative toxin.  She was treated with vancomycin. 9/29 Off pressors 10/2: Extubated 10/3 transfer to floor. 10/10 underwent repeat proctosigmoidoscopy for ongoing diarrhea.  Diffuse severe inflammation identified due to combination of Crohn's disease, active C. difficile infection and possible ischemic colitis from prior hypotension on presentation. 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  Intraoperative findings include frank necrosis with pelvic abscess formation and significant colitis. 10/12 some dilated small bowel with minimal OG output.  Continued observation advised.  Having significant postoperative pain. 10/13 became tachycardic and hypotensive.  More lethargic.  Transferred back to ICU. 10/15 blood cultures reports fungemia, resume PO vancomycin 10/17 CT abdomen pelvis notable for septic emboli - CTA chest on hold given IV access issues 10/18 TEE negative for endocarditis/bubble study - NG out for TEE probe.  Advance  diet, hoping to avoid replacement of NG tube 10/19 wound VAC dysfunctional, surgery switching to wet-to-dry over the weekend -pain poorly controlled today, add oxycodone (medication lists this is an allergy but patient verbally approved) 10/20 pain control improving, transition from IV fentanyl to Dilaudid, continue p.o. oxycodone, restart home anxiolytics with Xanax 10/21 Successful left subclavian CVC placement with IR Therapy recommended CIR.  Subjective: Seen and examined earlier this morning.  No major events overnight of this morning.  She reports headache after taking oxycodone yesterday.  She thinks oxycodone is contributing to her headache although she has been on oxycodone for over a week without headache.  She wants oxycodone stopped although I do not believe oxycodone is contributing to her headache.  She continues to endorse significant abdominal pain.  She rates her pain 9/10 although she does not appear to be in that much distress.  Again we discussed the importance of coming off IV pain medication before transfer to rehab.  Objective: Vitals:   03/05/23 0758 03/05/23 0800 03/05/23 0805 03/05/23 1145  BP:  123/71  (!) 108/56  Pulse:  (!) 129 (!) 128 (!) 106  Resp:  13 15 13   Temp: 97.9 F (36.6 C)   98 F (36.7 C)  TempSrc: Oral   Oral  SpO2:  96% 96% 95%  Weight:      Height:        Examination:  GENERAL: No apparent distress.  Nontoxic. HEENT: MMM.  Vision and hearing grossly intact.  No tenderness with palpation over temporal regions. NECK: Supple.  No apparent JVD.  RESP:  No IWOB.  Fair aeration bilaterally. CVS: HR 110s.  Regular rhythm.  Heart sounds normal.  ABD/GI/GU: BS+.  Dressing over laparotomy wound DCI.  Ileostomy bag  with liquid stool.  JP drain to LLQ. MSK/EXT:  Moves extremities. No apparent deformity. No edema.  SKIN: no apparent skin lesion or wound NEURO: Awake, alert and oriented appropriately.  No apparent focal neuro deficit. PSYCH: Calm.  Normal affect.    Microbiology summarized: 9/26-COVID-19, influenza and RSV PCR nonreactive 9/26 and 10/13-MRSA PCR screen negative 9/26-blood culture with staph hominis in 1 out of 2 bottles likely contaminant 9/29-C. difficile PCR positive 9/29-GIP negative 10/12-blood culture with Candida glabrata in 1 out of 2 bottles 10/17-blood cultures NGTD 10/27-pleural fluid culture NGTD  Assessment and plan: Septic shock, multifactorial-including C. difficile colitis, fungemia, intra-abdominal abscess Fungemia, candida glabrata, likely POA Cdiff Colitis: POA Septic emboli, thrombocytosis Intra-abdominal infection/abscess -Culture data as above. -S/p laparotomy with total colectomy with ileostomy and LLQ JP drain -Received ceftriaxone and Flagyl for extended course until 10/28. -TTE/TEE negative for vegetation.  Bubble study negative. -Appreciate input by ID -Continue antifungal for 6 weeks for fungemia.  Continue micafungin pending sensitivity -Complete 14 days of p.o. vancomycin followed by taper for C. Difficile-orders in. -Ophthalmology or neuro consultation.  -Evaluated by ophthalmology on 10/23.  No evidence of fungal infiltrate on funduscopic evaluation -Left subclavian tunneled CVC placed 02/21/2023 with IR -Diarrhea and leukocytosis improving.  Diarrhea partly due to colectomy. -Wound care per general surgery and wound care staff.  WTD daily -Continue fentanyl patch.  Continue IV Dilaudid as needed  -Discontinued oxycodone at patient's request.  She believes this is causing headache  Intractable abdominal pain, multifactorial- Left-sided chest pain: Aggravated by stress.  Also reproducible.  EKG without acute ischemic finding.  Resolved. Left-sided headache-reports migraine as a child.  She thinks oxycodone is contributing.  Headache resolved -Continue fentanyl patch and as needed IV Dilaudid for now -Continue home amitriptyline, gabapentin and Cymbalta -Cannot use NSAIDs due  to significant allergies -Discussed the importance of weaning of IV pain medication as early as possible.  Moderate to severe malnutrition Postoperative short bowel syndrome/high output ostomy, improving Malabsorption likely acute on chronic -Encourage oral intake. -Optimize nutrition -IVF as needed -Not a candidate for TPN due to fungemia   Left-sided pleural effusion/iatrogenic pneumothorax, small: S/p thoracocentesis on 10/27.  Fluid is exudative by LDH.  Fluid culture NGTD.  Subsequent chest x-ray on 10/28 without pneumothorax.  Cytology negative. 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  Previous provider discussed with Dr. Cardell Peach with urology who recommended outpatient evaluation with cystoscopy  Acute pulmonary emboli/right upper extremity DVT -Continue Lovenox   Thrombocytosis: Evaluated by hematology and felt to be inflammatory response.  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   Recent Labs  Lab 02/27/23 0530 02/28/23 0645 03/01/23 0445 03/02/23 0001 03/03/23 0455 03/04/23 0530 03/05/23 0556  PLT 1,064* 1,039* 937* 969* 1,082* 1,268* 1,212*  -Continue monitoring -Outpatient follow-up   Longstanding Crohn's disease: Not chronically on steroids so less concern for adrenal insufficiency. S/p colectomy -Hold steroids/Skyrizi in the setting of ongoing infection.   Sinus tachycardia: Likely due to acute illness and possible dehydration.  TSH normal.  He had some pericardial effusion without tamponade.  Repeat CT on 10/25 did not reveal pericardial effusion. -Increased metoprolol to 50 mg twice daily on 11/1.  Continue the same -Trial of IV fluid bolus today  Pericardial effusion without tamponade: Initially seen on TTE.  Was not seen on CTA on 10/25  -May need outpatient echocardiogram to confirm resolution.  Cardiac arrest secondary to septic shock requiring CPR on  admission   Controlled  DM-2 with hyperglycemia: A1c 5.8% on 9/26. Recent Labs  Lab 03/04/23 2011 03/04/23 2318 03/05/23 0339 03/05/23 0755 03/05/23 1133  GLUCAP 115* 100* 94 117* 108*  -Continue current insulin regimen   History of seizures -Continue PTA keppra/gabapentin   History of anxiety -Resume home Xanax, Cymbalta, hydroxyzine   History of hypertension -Metoprolol as above   History of COPD: Stable -Continue Yupelri daily and xopenex PRN   GERD -Continue PTA PPI   Normocytic Anemia: Stable -Transfuse for Hb <7 or hemodynamically significant bleeding  Hypokalemia/hypomagnesemia -Monitor replenish as appropriate   Inadequate oral intake Body mass index is 23.17 kg/m. Nutrition Problem: Inadequate oral intake Etiology: inability to eat Signs/Symptoms: NPO status Interventions: Refer to RD note for recommendations   DVT prophylaxis:  SCDs Start: 01/27/23 1111  Code Status: Full code Family Communication: None at bedside Level of care: Telemetry Cardiac Status is: Inpatient Remains inpatient appropriate because: Sepsis, tachycardia   Final disposition: CIR Consultants:  Pulmonology General Surgery Hematology Urology Infectious disease Ophthalmology   55 minutes with more than 50% spent in reviewing records, counseling patient/family and coordinating care.   Sch Meds:  Scheduled Meds:  acetaminophen  650 mg Oral Q6H WA   amitriptyline  25 mg Oral QHS   Chlorhexidine Gluconate Cloth  6 each Topical Daily   diphenoxylate-atropine  1 tablet Oral BID   DULoxetine  30 mg Oral Daily   enoxaparin (LOVENOX) injection  70 mg Subcutaneous Q12H   feeding supplement  237 mL Oral TID BM   fentaNYL  1 patch Transdermal Q72H   folic acid  1 mg Oral Daily   gabapentin  100 mg Oral TID   Gerhardt's butt cream   Topical BID   insulin aspart  0-9 Units Subcutaneous Q4H   levETIRAcetam  750 mg Oral BID   lidocaine  2 patch Transdermal Q24H   lidocaine (PF)  8 mL Intradermal Once    loperamide  4 mg Oral QID   loratadine  10 mg Oral Daily   methocarbamol  1,000 mg Oral TID   metoprolol tartrate  50 mg Oral BID   multivitamin with minerals  1 tablet Oral Daily   pantoprazole  40 mg Oral Daily   revefenacin  175 mcg Nebulization Daily   thiamine  100 mg Oral Daily   vancomycin  125 mg Oral QID   Followed by   Melene Muller ON 03/06/2023] vancomycin  125 mg Oral BID   Followed by   Melene Muller ON 03/13/2023] vancomycin  125 mg Oral Daily   Followed by   Melene Muller ON 03/20/2023] vancomycin  125 mg Oral QODAY   Followed by   Melene Muller ON 03/28/2023] vancomycin  125 mg Oral Q3 days   Continuous Infusions:  micafungin (MYCAMINE) 150 mg in sodium chloride 0.9 % 100 mL IVPB 150 mg (03/05/23 0935)   PRN Meds:.ALPRAZolam, bismuth subsalicylate, camphor-menthol, dextrose, HYDROmorphone (DILAUDID) injection, levalbuterol, magic mouthwash, metoprolol tartrate, ondansetron (ZOFRAN) IV, mouth rinse  Antimicrobials: Anti-infectives (From admission, onward)    Start     Dose/Rate Route Frequency Ordered Stop   03/28/23 1000  vancomycin (VANCOCIN) capsule 125 mg       Placed in "Followed by" Linked Group   125 mg Oral Every 3 DAYS 03/01/23 0750 04/06/23 0959   03/20/23 1000  vancomycin (VANCOCIN) capsule 125 mg       Placed in "Followed by" Linked Group   125 mg Oral Every other day 03/01/23 0750 03/28/23 1610  03/13/23 1000  vancomycin (VANCOCIN) capsule 125 mg       Placed in "Followed by" Linked Group   125 mg Oral Daily 03/01/23 0750 03/20/23 0959   03/06/23 1000  vancomycin (VANCOCIN) capsule 125 mg       Placed in "Followed by" Linked Group   125 mg Oral 2 times daily 03/01/23 0750 03/13/23 0959   03/01/23 1000  vancomycin (VANCOCIN) capsule 125 mg       Placed in "Followed by" Linked Group   125 mg Oral 4 times daily 03/01/23 0750 03/05/23 2359   02/18/23 1800  vancomycin (VANCOCIN) 50 mg/mL oral solution SOLN 125 mg        125 mg Oral 4 times daily 02/18/23 1439 02/26/23 0959    02/18/23 0400  micafungin (MYCAMINE) 150 mg in sodium chloride 0.9 % 100 mL IVPB        150 mg 107.5 mL/hr over 1 Hours Intravenous Every 24 hours 02/17/23 0936     02/17/23 0900  vancomycin (VANCOREADY) IVPB 750 mg/150 mL  Status:  Discontinued        750 mg 150 mL/hr over 60 Minutes Intravenous Every 8 hours 02/17/23 0204 02/17/23 0947   02/17/23 0400  micafungin (MYCAMINE) 100 mg in sodium chloride 0.9 % 100 mL IVPB  Status:  Discontinued        100 mg 105 mL/hr over 1 Hours Intravenous Every 24 hours 02/16/23 0353 02/17/23 0936   02/16/23 1045  vancomycin (VANCOCIN) 50 mg/mL oral solution SOLN 125 mg  Status:  Discontinued        125 mg Per Tube 4 times daily 02/16/23 0953 02/18/23 1439   02/16/23 0415  micafungin (MYCAMINE) 100 mg in sodium chloride 0.9 % 100 mL IVPB        100 mg 105 mL/hr over 1 Hours Intravenous STAT 02/16/23 0353 02/16/23 0610   02/15/23 1300  vancomycin (VANCOREADY) IVPB 750 mg/150 mL  Status:  Discontinued        750 mg 150 mL/hr over 60 Minutes Intravenous Every 12 hours 02/14/23 1156 02/17/23 0204   02/14/23 1130  vancomycin (VANCOCIN) IVPB 1000 mg/200 mL premix        1,000 mg 200 mL/hr over 60 Minutes Intravenous  Once 02/14/23 1043 02/14/23 1249   02/11/23 2000  cefTRIAXone (ROCEPHIN) 2 g in sodium chloride 0.9 % 100 mL IVPB  Status:  Discontinued       Note to Pharmacy: Pharmacy may adjust dosing strength / duration / interval for maximal efficacy   2 g 200 mL/hr over 30 Minutes Intravenous Every 24 hours 02/11/23 1622 02/28/23 0852   02/10/23 1600  metroNIDAZOLE (FLAGYL) IVPB 500 mg  Status:  Discontinued        500 mg 100 mL/hr over 60 Minutes Intravenous Every 12 hours 02/10/23 1444 02/28/23 0857   02/08/23 1800  vancomycin (VANCOCIN) 50 mg/mL oral solution SOLN 500 mg        500 mg Per Tube Every 6 hours 02/08/23 1325 02/12/23 1759   02/03/23 2200  metroNIDAZOLE (FLAGYL) tablet 500 mg        500 mg Per Tube Every 12 hours 02/03/23 1406 02/06/23  2148   02/02/23 1815  vancomycin (VANCOCIN) 50 mg/mL oral solution SOLN 125 mg  Status:  Discontinued        125 mg Per Tube Every 6 hours 02/02/23 1717 02/08/23 1325   02/02/23 1545  vancomycin (VANCOCIN) capsule 125 mg  Status:  Discontinued  125 mg Oral 4 times daily 02/02/23 1530 02/02/23 1717   01/27/23 1430  cefTRIAXone (ROCEPHIN) 2 g in sodium chloride 0.9 % 100 mL IVPB        2 g 200 mL/hr over 30 Minutes Intravenous Every 24 hours 01/27/23 1340 02/05/23 1403   01/27/23 1400  metroNIDAZOLE (FLAGYL) IVPB 500 mg  Status:  Discontinued        500 mg 100 mL/hr over 60 Minutes Intravenous Every 12 hours 01/27/23 1340 02/03/23 1406   01/27/23 0545  vancomycin (VANCOREADY) IVPB 1250 mg/250 mL        1,250 mg 166.7 mL/hr over 90 Minutes Intravenous  Once 01/27/23 0542 01/27/23 0809   01/27/23 0545  ceFEPIme (MAXIPIME) 2 g in sodium chloride 0.9 % 100 mL IVPB        2 g 200 mL/hr over 30 Minutes Intravenous  Once 01/27/23 0542 01/27/23 0621   01/27/23 0545  metroNIDAZOLE (FLAGYL) IVPB 500 mg        500 mg 100 mL/hr over 60 Minutes Intravenous  Once 01/27/23 0542 01/27/23 0741        I have personally reviewed the following labs and images: CBC: Recent Labs  Lab 03/01/23 0445 03/02/23 0001 03/03/23 0455 03/04/23 0530 03/05/23 0556  WBC 15.1* 12.0* 11.1* 12.4* 10.5  NEUTROABS  --   --  6.8  --   --   HGB 10.2* 10.1* 10.5* 11.1* 11.3*  HCT 32.5* 31.8* 32.9* 34.6* 35.4*  MCV 86.0 85.5 86.6 86.1 85.3  PLT 937* 969* 1,082* 1,268* 1,212*   BMP &GFR Recent Labs  Lab 02/27/23 0530 03/02/23 0001 03/03/23 0455 03/04/23 0530 03/05/23 0556  NA 133* 131* 131* 131* 130*  K 4.0 4.2 3.9 4.1 3.6  CL 93* 94* 93* 94* 90*  CO2 27 26 26 26 24   GLUCOSE 99 108* 89 106* 113*  BUN 6 7 6  <5* 6  CREATININE 0.48 0.61 0.57 0.53 0.54  CALCIUM 9.6 9.6 9.8 9.7 10.1  MG  --   --  1.5* 1.9 1.6*  PHOS  --   --   --   --  4.3   Estimated Creatinine Clearance: 67.3 mL/min (by C-G formula  based on SCr of 0.54 mg/dL). Liver & Pancreas: Recent Labs  Lab 02/27/23 0530 03/02/23 0001 03/03/23 0455 03/05/23 0556  AST 13* 15 19  --   ALT 7 8 9   --   ALKPHOS 178* 151* 161*  --   BILITOT 0.4 0.5 0.3  --   PROT 6.9 7.0 6.8  --   ALBUMIN 2.5* 2.6* 2.7* 3.0*   No results for input(s): "LIPASE", "AMYLASE" in the last 168 hours. No results for input(s): "AMMONIA" in the last 168 hours. Diabetic: No results for input(s): "HGBA1C" in the last 72 hours. Recent Labs  Lab 03/04/23 2011 03/04/23 2318 03/05/23 0339 03/05/23 0755 03/05/23 1133  GLUCAP 115* 100* 94 117* 108*   Cardiac Enzymes: No results for input(s): "CKTOTAL", "CKMB", "CKMBINDEX", "TROPONINI" in the last 168 hours. No results for input(s): "PROBNP" in the last 8760 hours. Coagulation Profile: No results for input(s): "INR", "PROTIME" in the last 168 hours. Thyroid Function Tests: No results for input(s): "TSH", "T4TOTAL", "FREET4", "T3FREE", "THYROIDAB" in the last 72 hours. Lipid Profile: No results for input(s): "CHOL", "HDL", "LDLCALC", "TRIG", "CHOLHDL", "LDLDIRECT" in the last 72 hours. Anemia Panel: No results for input(s): "VITAMINB12", "FOLATE", "FERRITIN", "TIBC", "IRON", "RETICCTPCT" in the last 72 hours. Urine analysis:    Component Value Date/Time  COLORURINE AMBER (A) 01/27/2023 0514   APPEARANCEUR HAZY (A) 01/27/2023 0514   LABSPEC 1.020 01/27/2023 0514   PHURINE 5.0 01/27/2023 0514   GLUCOSEU NEGATIVE 01/27/2023 0514   GLUCOSEU NEG mg/dL 44/05/270 5366   HGBUR NEGATIVE 01/27/2023 0514   BILIRUBINUR NEGATIVE 01/27/2023 0514   KETONESUR 5 (A) 01/27/2023 0514   PROTEINUR 30 (A) 01/27/2023 0514   UROBILINOGEN 0.2 12/26/2013 1530   NITRITE NEGATIVE 01/27/2023 0514   LEUKOCYTESUR TRACE (A) 01/27/2023 0514   Sepsis Labs: Invalid input(s): "PROCALCITONIN", "LACTICIDVEN"  Microbiology: Recent Results (from the past 240 hour(s))  Culture, body fluid w Gram Stain-bottle     Status: None    Collection Time: 02/27/23  1:00 PM   Specimen: Pleura  Result Value Ref Range Status   Specimen Description PLEURAL  Final   Special Requests LEFT  Final   Culture   Final    NO GROWTH 5 DAYS Performed at [facility], [address] [place], [place] [address]    Report Status 03/04/2023 FINAL  Final  Gram stain     Status: None   Collection Time: 02/27/23  1:00 PM   Specimen: Pleura  Result Value Ref Range Status   Specimen Description PLEURAL  Final   Special Requests LEFT  Final   Gram Stain   Final    FEW WBC SEEN NO ORGANISMS SEEN Performed at [facility], [address], [place], [place] [address]    Report Status 02/27/2023 FINAL  Final    Radiology Studies: No results found.    Jon Kasparek T. Mersedes [facility]  If 7PM-7AM, please contact night-coverage [email or web address] 03/05/2023, 2:45 PM

## 2023-03-05 NOTE — Progress Notes (Signed)
Mobility Specialist Progress Note:   03/05/23 1300  Mobility  Activity Ambulated with assistance in room  Level of Assistance Contact guard assist, steadying assist  Assistive Device Front wheel walker  Distance Ambulated (ft) 30 ft  Activity Response Tolerated well  Mobility Referral Yes  $Mobility charge 1 Mobility  Mobility Specialist Start Time (ACUTE ONLY) 1331  Mobility Specialist Stop Time (ACUTE ONLY) 1340  Mobility Specialist Time Calculation (min) (ACUTE ONLY) 9 min   Pt received in bed, agreeable to mobility. C/o some dizziness upon sitting EOB. Felt better upon standing and ambulation. No complaints throughout. Pt left in bed with call bell and all needs met.   D'Vante Earlene Plater Mobility Specialist Please contact via Special educational needs teacher or Rehab office at [phone]

## 2023-03-05 NOTE — Plan of Care (Signed)

## 2023-03-05 NOTE — Progress Notes (Signed)
[facility]  Date of Admission:  01/27/2023     Principal Problem:   Fungemia Active Problems:   Shock (HCC)   Metabolic encephalopathy   Cardiac arrest (HCC)   Acute respiratory failure with hypoxia (HCC)   Upper GI bleed   Granulomatous colitis (HCC)   Pseudomembranous colitis   Crohn's disease without complication (HCC)   Ileus (HCC)   C. 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  TEE did not show vegetations.  Repeat blood cultures on 10/17 remain negative. - Central line placed on 10/21 -Upper extremity and lower extremity Dopplers are negative  #Intrabdominal infection Repeat CT is reassuring few locules of free air in abdomen likely postoperative.  Continue ceftriaxone and metronidazole for now -CTA chest on 10/25 showed stable b/l embolic, RLL opacity. No respiratory complaints and on RA. -Optho note no fungal infiltrates on DFE -Repeat CT AP on 10/25 noted near complete resolution of intracranial gas only a tiny loculation of gas in right abdomen.  Reduce scattered fluid collection along mesentery.  Slightly reduced hazy stranding in adipose tissues.  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  Dilated small bowel and air levels extending to margin of enterostomy favoring ileus orientations.  Reduced size conspicuity of previous splenic infarcts.  #C diff illietis - On 9/29 C. difficile antigen and PCR positive, toxin negative -Diarrhea is improving.  Continue p.o. vancomycin for now  Recommendations: -Continue micafungin, follow-up with Candida  glabrata sensitivities.  Needs to be seen by ophthalmology or neuro in setting of candidemia.  Given infarcts but no vegetation on valves can treat with 6 weeks of antifungal therapy(EOT 11/27) for empiric endovascular infection. She does has a hx of qtc that has been prolonged(resolved) as such, cresemba may be an option for prolonged therapy. We will plan on repeat imaging of spleen and lung at the end of mica x 6 weeks to set duration of PO therapy(If stable then would not need further antifungal treatment). 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 Reginia Forts) recommends OP eval with cystoscopy  New ID service on Monday Microbiology:   Antibiotics: Ceftriaxone, metronidazole, micafungin, p.o. vancomycin  SUBJECTIVE: Resting in bed.  Reports diarrhea is improving. Interval: Afebrile overnight, WBC 12K  Review of Systems: Review of Systems  All other systems reviewed and are negative.    Scheduled Meds:  acetaminophen  650 mg Oral Q6H WA   amitriptyline  25 mg Oral QHS   Chlorhexidine Gluconate Cloth  6 each Topical Daily   diphenoxylate-atropine  1 tablet Oral BID   DULoxetine  30 mg Oral Daily   enoxaparin (LOVENOX) injection  70 mg Subcutaneous Q12H   feeding supplement  237 mL Oral TID BM   fentaNYL  1 patch Transdermal Q72H   folic acid  1 mg Oral Daily   gabapentin  100 mg Oral TID   Gerhardt's butt cream   Topical BID   insulin aspart  0-9 Units Subcutaneous Q4H   levETIRAcetam  750 mg Oral BID   lidocaine  2 patch Transdermal Q24H   lidocaine (PF)  8 mL Intradermal Once   loperamide  4 mg Oral QID   loratadine  10 mg Oral Daily   methocarbamol  1,000 mg Oral TID   metoprolol tartrate  50 mg Oral BID   multivitamin with minerals  1 tablet Oral Daily    pantoprazole  40 mg Oral Daily   revefenacin  175 mcg Nebulization Daily   thiamine  100 mg Oral Daily   vancomycin  125 mg Oral QID   Followed by   Melene Muller ON 03/15/2023] vancomycin  125 mg Oral BID   Followed by   Melene Muller ON 03/22/2023] vancomycin  125 mg Oral Daily   Followed by   Melene Muller ON 03/29/2023] vancomycin  125 mg Oral QODAY   Followed by   Melene Muller ON 04/06/2023] vancomycin  125 mg Oral Q3 days   Continuous Infusions:  micafungin (MYCAMINE) 150 mg in sodium chloride 0.9 % 100 mL IVPB 150 mg (03/04/23 1046)   PRN Meds:.ALPRAZolam, bismuth subsalicylate, camphor-menthol, dextrose, HYDROmorphone (DILAUDID) injection, levalbuterol, magic mouthwash, metoprolol tartrate, ondansetron (ZOFRAN) IV, mouth rinse, oxyCODONE Allergies  Allergen Reactions   Codeine Shortness Of Breath, Swelling and Rash    Throat swelling   Hydrocodone Shortness Of Breath, Swelling and Rash   Ketorolac Nausea And Vomiting and Swelling    Pt reports n/v and swelling to throat   Morphine And Codeine Shortness Of Breath, Swelling and Rash   Penicillins Shortness Of Breath, Swelling and Other (See Comments)    Throat swells 02/06/21--TOLERATES CEFTRIAXONE     Nickel Rash   Pregabalin Other (See Comments)   Acetaminophen     Per MD patient states she can't take Tylenol because of liver enzymes   Atarax [Hydroxyzine] Itching and Swelling    Mildly swollen throat   Ativan [Lorazepam] Other (See Comments)    Migraines.   Strawberry Extract Swelling   Watermelon Concentrate Swelling   Albuterol Palpitations   Benadryl [Diphenhydramine Hcl] Palpitations   Humira [Adalimumab] Rash   Latex Rash   Remicade [Infliximab] Rash    Blisters and Welts    Tramadol Rash    Rash and itching    OBJECTIVE: Vitals:   03/04/23 2011 03/04/23 2300 03/04/23 2324 03/05/23 0335  BP: (!) 123/58   106/61  Pulse: (!) 129   (!) 112  Resp: 16 12    Temp: 98 F (36.7 C)  98.1 F (36.7 C) 97.9 F (36.6 C)  TempSrc: Oral   Oral Oral  SpO2: 95%   96%  Weight:      Height:       Body mass index is 23.83 kg/m.  Physical Exam Constitutional:      Appearance: Normal appearance.  HENT:     Head: Normocephalic and atraumatic.     Right Ear: Tympanic membrane normal.     Left Ear: Tympanic membrane normal.     Nose: Nose normal.     Mouth/Throat:     Mouth: Mucous membranes are moist.  Eyes:     Extraocular Movements: Extraocular movements intact.     Conjunctiva/sclera: Conjunctivae normal.     Pupils: Pupils are equal, round, and reactive to light.  Cardiovascular:     Rate and Rhythm: Normal rate and regular rhythm.  Heart sounds: No murmur heard.    No friction rub. No gallop.  Pulmonary:     Effort: Pulmonary effort is normal.     Breath sounds: Normal breath sounds.  Musculoskeletal:        General: Normal range of motion.  Skin:    General: Skin is warm and dry.  Neurological:     General: No focal deficit present.     Mental Status: She is alert and oriented to person, place, and time.  Psychiatric:        Mood and Affect: Mood normal.   RUQ colostomy LLQ drain    Lab Results Lab Results  Component Value Date   WBC 12.4 (H) 03/04/2023   HGB 11.1 (L) 03/04/2023   HCT 34.6 (L) 03/04/2023   MCV 86.1 03/04/2023   PLT 1,268 (HH) 03/04/2023    Lab Results  Component Value Date   CREATININE 0.53 03/04/2023   BUN <5 (L) 03/04/2023   NA 131 (L) 03/04/2023   K 4.1 03/04/2023   CL 94 (L) 03/04/2023   CO2 26 03/04/2023    Lab Results  Component Value Date   ALT 9 03/03/2023   AST 19 03/03/2023   ALKPHOS 161 (H) 03/03/2023   BILITOT 0.3 03/03/2023        Danelle Earthly, MD [facility] 03/05/2023, 5:16 AM I have personally spent 54 minutes involved in face-to-face and non-face-to-face activities for this patient on the day of the visit. Professional time spent includes the following activities: Preparing to see the  patient (review of tests), Obtaining and/or reviewing separately obtained history (admission/discharge record), Performing a medically appropriate examination and/or evaluation , Ordering medications/tests/procedures, referring and communicating with other health care professionals, Documenting clinical information in the EMR, Independently interpreting results (not separately reported), Communicating results to the patient/family/caregiver, Counseling and educating the patient/family/caregiver and Care coordination (not separately reported).

## 2023-03-06 DIAGNOSIS — D62 Acute posthemorrhagic anemia: Secondary | ICD-10-CM | POA: Diagnosis not present

## 2023-03-06 DIAGNOSIS — A0472 Enterocolitis due to Clostridium difficile, not specified as recurrent: Secondary | ICD-10-CM | POA: Diagnosis not present

## 2023-03-06 DIAGNOSIS — B49 Unspecified mycosis: Secondary | ICD-10-CM | POA: Diagnosis not present

## 2023-03-06 DIAGNOSIS — I469 Cardiac arrest, cause unspecified: Secondary | ICD-10-CM | POA: Diagnosis not present

## 2023-03-06 LAB — GLUCOSE, CAPILLARY
Glucose-Capillary: 115 mg/dL — ABNORMAL HIGH (ref 70–99)
Glucose-Capillary: 86 mg/dL (ref 70–99)
Glucose-Capillary: 88 mg/dL (ref 70–99)
Glucose-Capillary: 91 mg/dL (ref 70–99)
Glucose-Capillary: 96 mg/dL (ref 70–99)
Glucose-Capillary: 96 mg/dL (ref 70–99)
Glucose-Capillary: 99 mg/dL (ref 70–99)

## 2023-03-06 MED ORDER — IBUPROFEN 400 MG PO TABS: 400.0000 mg | ORAL_TABLET | Freq: Four times a day (QID) | ORAL | Status: DC | PRN

## 2023-03-06 MED ORDER — DIPHENOXYLATE-ATROPINE 2.5-0.025 MG PO TABS
1.0000 | ORAL_TABLET | Freq: Three times a day (TID) | ORAL | Status: DC
  Administered 2023-03-06 – 2023-03-07 (×4): 1 via ORAL
  Filled 2023-03-06 (×4): qty 1

## 2023-03-06 MED ORDER — METOPROLOL TARTRATE 50 MG PO TABS
62.5000 mg | ORAL_TABLET | Freq: Two times a day (BID) | ORAL | Status: DC
  Administered 2023-03-06 – 2023-03-18 (×25): 62.5 mg via ORAL
  Filled 2023-03-06 (×25): qty 1

## 2023-03-06 MED ORDER — HYDROMORPHONE HCL 2 MG PO TABS
2.0000 mg | ORAL_TABLET | ORAL | Status: DC | PRN
  Administered 2023-03-06 – 2023-03-09 (×10): 2 mg via ORAL
  Filled 2023-03-06 (×11): qty 1

## 2023-03-06 NOTE — Progress Notes (Signed)
Mobility Specialist Progress Note:    03/06/23 1000  Mobility  Activity Refused mobility  Mobility Specialist Start Time (ACUTE ONLY) 1012   Pt refused mobility d/t abdominal pain. Will f/u as able.    [name] Mobility Specialist Please contact via Special educational needs teacher or Rehab office at [phone]

## 2023-03-06 NOTE — Plan of Care (Signed)

## 2023-03-06 NOTE — Progress Notes (Signed)
PROGRESS NOTE  [name] WUJ:[id number] DOB: August 27, 1970   PCP: [name], PA-C  Patient is from: Home  DOA: 01/27/2023 LOS: 38  Chief complaints Chief Complaint  Patient presents with   Respiratory Arrest     Brief Narrative / Interim history: 52 year old female with past medical history of COPD, Crohn's on Skyrizi, hypertension, type 2 diabetes mellitus who initially presented to the after being found unresponsive. Per family, patient has had bloody emesis with diarrhea over the past few days. Initial systolics in the 60s in the ED, and patient was minimally responsive. In the emergency room, patient was intubated, and patient was admitted to the ICU for further evaluation and management for shock and altered mental status.    01/27/2023: Admitted to ICU and intubated.  Felt to have septic shock of unclear source which resolved antibiotic therapy and fluid resuscitation.  Suspicion was that she may have had C. difficile colitis despite negative toxin.  She was treated with vancomycin. 9/29 Off pressors 10/2: Extubated 10/3 transfer to floor. 10/10 underwent repeat proctosigmoidoscopy for ongoing diarrhea.  Diffuse severe inflammation identified due to combination of Crohn's disease, active C. difficile infection and possible ischemic colitis from prior hypotension on presentation. 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  Intraoperative findings include frank necrosis with pelvic abscess formation and significant colitis. 10/12 some dilated small bowel with minimal OG output.  Continued observation advised.  Having significant postoperative pain. 10/13 became tachycardic and hypotensive.  More lethargic.  Transferred back to ICU. 10/15 blood cultures reports fungemia, resume PO vancomycin 10/17 CT abdomen pelvis notable for septic emboli - CTA chest on hold given IV access issues 10/18 TEE negative for endocarditis/bubble study - NG out for TEE probe.  Advance  diet, hoping to avoid replacement of NG tube 10/19 wound VAC dysfunctional, surgery switching to wet-to-dry over the weekend -pain poorly controlled today, add oxycodone (medication lists this is an allergy but patient verbally approved) 10/20 pain control improving, transition from IV fentanyl to Dilaudid, continue p.o. oxycodone, restart home anxiolytics with Xanax 10/21 Successful left subclavian CVC placement with IR Therapy recommended CIR.  Subjective: Seen and examined earlier this morning.  No major events overnight of this morning.  No further headache after stopping oxycodone.  Still with significant abdominal pain.  Heart rate elevated to 130 this morning.  About 1.7 L stool/24 hours.  Objective: Vitals:   03/06/23 0500 03/06/23 0757 03/06/23 0842 03/06/23 1157  BP:  128/73  114/67  Pulse:  (!) 123 (!) 131 (!) 101  Resp:   15   Temp:  97.8 F (36.6 C)  98.2 F (36.8 C)  TempSrc:  Oral  Oral  SpO2:  96% 96% 97%  Weight: 59.2 kg     Height:        Examination:  GENERAL: No apparent distress.  Nontoxic. HEENT: MMM.  Vision and hearing grossly intact.  No tenderness with palpation over temporal regions. NECK: Supple.  No apparent JVD.  RESP:  No IWOB.  Fair aeration bilaterally. CVS: HR 110s.  Regular rhythm.  Heart sounds normal.  ABD/GI/GU: BS+.  Dressing over laparotomy wound DCI.  Ileostomy bag with liquid stool.  JP drain to LLQ. MSK/EXT:  Moves extremities. No apparent deformity. No edema.  SKIN: no apparent skin lesion or wound NEURO: Awake, alert and oriented appropriately.  No apparent focal neuro deficit. PSYCH: Calm. Normal affect.    Microbiology summarized: 9/26-COVID-19, influenza and RSV PCR nonreactive 9/26 and 10/13-MRSA PCR screen negative 9/26-blood  culture with staph hominis in 1 out of 2 bottles likely contaminant 9/29-C. difficile PCR positive 9/29-GIP negative 10/12-blood culture with Candida glabrata in 1 out of 2 bottles 10/17-blood  cultures NGTD 10/27-pleural fluid culture NGTD  Assessment and plan: Septic shock, multifactorial-including C. difficile colitis, fungemia, intra-abdominal abscess Fungemia, candida glabrata, likely POA Cdiff Colitis: POA.  Completed treatment course. Septic emboli, thrombocytosis Intra-abdominal infection/abscess -Culture data as above. -S/p laparotomy with total colectomy with ileostomy and LLQ JP drain -Received ceftriaxone and Flagyl for extended course until 10/28. -TTE/TEE negative for vegetation.  Bubble study negative. -Appreciate input by ID -Continue antifungal for 6 weeks for fungemia.  Started on 10/16>>>. -Complete 14 days of p.o. vancomycin followed by taper for C. Difficile-completed. -Ophthalmology or neuro consultation.  -Evaluated by ophthalmology on 10/23.  No evidence of fungal infiltrate on funduscopic evaluation -Left subclavian tunneled CVC placed 02/21/2023 with IR -Leukocytosis resolved.  Still with diarrhea probably due to colectomy. -Wound care per general surgery and wound care staff.  WTD daily -Changed oxycodone to p.o. Dilaudid for moderate pain.  She believes oxycodone is causing headache -Continue fentanyl patch.  Continue IV Dilaudid as needed.  -Added Advil for mild pain.  She reports taking Advil/ibuprofen without problem at home.  Intractable abdominal pain, multifactorial- Left-sided chest pain: Aggravated by stress.  Also reproducible.  EKG without acute ischemic finding.  Resolved. Left-sided headache-reports migraine as a child.  She thinks oxycodone is contributing.  Headache resolved -Pain regimen as above. -Continue home amitriptyline, gabapentin and Cymbalta -Discussed the importance of weaning of IV pain medication as early as possible.  Moderate to severe malnutrition Postoperative short bowel syndrome/high output ostomy Malabsorption likely acute on chronic -Increase Lomotil to 3 times daily -Continue Imodium 4 mg 4 times  daily -Encourage oral intake. -Optimize nutrition -Not a candidate for TPN due to fungemia   Left-sided pleural effusion/iatrogenic pneumothorax, small: S/p thoracocentesis on 10/27.  Fluid is exudative by LDH.  Fluid culture NGTD.  Subsequent chest x-ray on 10/28 without pneumothorax.  Cytology negative. 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  Previous provider discussed with Dr. Cardell Peach with urology who recommended outpatient evaluation with cystoscopy  Acute pulmonary emboli/right upper extremity DVT -Continue Lovenox   Thrombocytosis: Evaluated by hematology and felt to be inflammatory response.  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   Recent Labs  Lab 02/28/23 0645 03/01/23 0445 03/02/23 0001 03/03/23 0455 03/04/23 0530 03/05/23 0556  PLT 1,039* 937* 969* 1,082* 1,268* 1,212*  -Continue monitoring -Outpatient follow-up   Longstanding Crohn's disease: Not chronically on steroids so less concern for adrenal insufficiency. S/p colectomy -Hold steroids/Skyrizi in the setting of ongoing infection.   Sinus tachycardia: Likely due to acute illness and possible dehydration.  TSH normal.  He had some pericardial effusion without tamponade.  Repeat CT on 10/25 did not reveal pericardial effusion. -Increased metoprolol to 62.5 mg twice daily  Pericardial effusion without tamponade: Initially seen on TTE.  Was not seen on CTA on 10/25  -May need outpatient echocardiogram to confirm resolution.  Cardiac arrest secondary to septic shock requiring CPR on admission   Controlled DM-2 with hyperglycemia: A1c 5.8% on 9/26. Recent Labs  Lab 03/05/23 1954 03/05/23 2358 03/06/23 0349 03/06/23 0802 03/06/23 1203  GLUCAP 94 88 91 99 96  -Continue current insulin regimen   History of seizures -Continue PTA keppra/gabapentin   History of anxiety -Resume home Xanax, Cymbalta, hydroxyzine   History of  hypertension -Metoprolol as above   History of COPD: Stable -Continue Yupelri daily and xopenex PRN   GERD -Continue PTA PPI   Normocytic Anemia: Stable -Transfuse for Hb <7 or hemodynamically significant bleeding  Hypokalemia/hypomagnesemia -Monitor replenish as appropriate   Inadequate oral intake Body mass index is 23.13 kg/m. Nutrition Problem: Inadequate oral intake Etiology: inability to eat Signs/Symptoms: NPO status Interventions: Refer to RD note for recommendations   DVT prophylaxis:  SCDs Start: 01/27/23 1111  Code Status: Full code Family Communication: None at bedside Level of care: Telemetry Cardiac Status is: Inpatient Remains inpatient appropriate because: Sepsis, tachycardia   Final disposition: CIR Consultants:  Pulmonology General Surgery Hematology Urology Infectious disease Ophthalmology   55 minutes with more than 50% spent in reviewing records, counseling patient/family and coordinating care.   Sch Meds:  Scheduled Meds:  acetaminophen  650 mg Oral Q6H WA   amitriptyline  25 mg Oral QHS   Chlorhexidine Gluconate Cloth  6 each Topical Daily   diphenoxylate-atropine  1 tablet Oral TID   DULoxetine  30 mg Oral Daily   enoxaparin (LOVENOX) injection  70 mg Subcutaneous Q12H   feeding supplement  237 mL Oral TID BM   fentaNYL  1 patch Transdermal Q72H   folic acid  1 mg Oral Daily   gabapentin  100 mg Oral TID   Gerhardt's butt cream   Topical BID   insulin aspart  0-9 Units Subcutaneous Q4H   levETIRAcetam  750 mg Oral BID   lidocaine  2 patch Transdermal Q24H   lidocaine (PF)  8 mL Intradermal Once   loperamide  4 mg Oral QID   loratadine  10 mg Oral Daily   methocarbamol  1,000 mg Oral TID   metoprolol tartrate  62.5 mg Oral BID   multivitamin with minerals  1 tablet Oral Daily   pantoprazole  40 mg Oral Daily   revefenacin  175 mcg Nebulization Daily   thiamine  100 mg Oral Daily   vancomycin  125 mg Oral BID   Followed by    Melene Muller ON 03/13/2023] vancomycin  125 mg Oral Daily   Followed by   Melene Muller ON 03/20/2023] vancomycin  125 mg Oral QODAY   Followed by   Melene Muller ON 03/28/2023] vancomycin  125 mg Oral Q3 days   Continuous Infusions:  micafungin (MYCAMINE) 150 mg in sodium chloride 0.9 % 100 mL IVPB 150 mg (03/06/23 0905)   PRN Meds:.ALPRAZolam, bismuth subsalicylate, camphor-menthol, dextrose, HYDROmorphone (DILAUDID) injection, HYDROmorphone, ibuprofen, levalbuterol, magic mouthwash, metoprolol tartrate, ondansetron (ZOFRAN) IV, mouth rinse  Antimicrobials: Anti-infectives (From admission, onward)    Start     Dose/Rate Route Frequency Ordered Stop   03/28/23 1000  vancomycin (VANCOCIN) capsule 125 mg       Placed in "Followed by" Linked Group   125 mg Oral Every 3 DAYS 03/01/23 0750 04/06/23 0959   03/20/23 1000  vancomycin (VANCOCIN) capsule 125 mg       Placed in "Followed by" Linked Group   125 mg Oral Every other day 03/01/23 0750 03/28/23 0959   03/13/23 1000  vancomycin (VANCOCIN) capsule 125 mg       Placed in "Followed by" Linked Group   125 mg Oral Daily 03/01/23 0750 03/20/23 0959   03/06/23 1000  vancomycin (VANCOCIN) capsule 125 mg       Placed in "Followed by" Linked Group   125 mg Oral 2 times daily 03/01/23 0750 03/13/23 0959   03/01/23 1000  vancomycin (VANCOCIN) capsule  125 mg       Placed in "Followed by" Linked Group   125 mg Oral 4 times daily 03/01/23 0750 03/05/23 2134   02/18/23 1800  vancomycin (VANCOCIN) 50 mg/mL oral solution SOLN 125 mg        125 mg Oral 4 times daily 02/18/23 1439 02/26/23 0959   02/18/23 0400  micafungin (MYCAMINE) 150 mg in sodium chloride 0.9 % 100 mL IVPB        150 mg 107.5 mL/hr over 1 Hours Intravenous Every 24 hours 02/17/23 0936     02/17/23 0900  vancomycin (VANCOREADY) IVPB 750 mg/150 mL  Status:  Discontinued        750 mg 150 mL/hr over 60 Minutes Intravenous Every 8 hours 02/17/23 0204 02/17/23 0947   02/17/23 0400  micafungin  (MYCAMINE) 100 mg in sodium chloride 0.9 % 100 mL IVPB  Status:  Discontinued        100 mg 105 mL/hr over 1 Hours Intravenous Every 24 hours 02/16/23 0353 02/17/23 0936   02/16/23 1045  vancomycin (VANCOCIN) 50 mg/mL oral solution SOLN 125 mg  Status:  Discontinued        125 mg Per Tube 4 times daily 02/16/23 0953 02/18/23 1439   02/16/23 0415  micafungin (MYCAMINE) 100 mg in sodium chloride 0.9 % 100 mL IVPB        100 mg 105 mL/hr over 1 Hours Intravenous STAT 02/16/23 0353 02/16/23 0610   02/15/23 1300  vancomycin (VANCOREADY) IVPB 750 mg/150 mL  Status:  Discontinued        750 mg 150 mL/hr over 60 Minutes Intravenous Every 12 hours 02/14/23 1156 02/17/23 0204   02/14/23 1130  vancomycin (VANCOCIN) IVPB 1000 mg/200 mL premix        1,000 mg 200 mL/hr over 60 Minutes Intravenous  Once 02/14/23 1043 02/14/23 1249   02/11/23 2000  cefTRIAXone (ROCEPHIN) 2 g in sodium chloride 0.9 % 100 mL IVPB  Status:  Discontinued       Note to Pharmacy: Pharmacy may adjust dosing strength / duration / interval for maximal efficacy   2 g 200 mL/hr over 30 Minutes Intravenous Every 24 hours 02/11/23 1622 02/28/23 0852   02/10/23 1600  metroNIDAZOLE (FLAGYL) IVPB 500 mg  Status:  Discontinued        500 mg 100 mL/hr over 60 Minutes Intravenous Every 12 hours 02/10/23 1444 02/28/23 0857   02/08/23 1800  vancomycin (VANCOCIN) 50 mg/mL oral solution SOLN 500 mg        500 mg Per Tube Every 6 hours 02/08/23 1325 02/12/23 1759   02/03/23 2200  metroNIDAZOLE (FLAGYL) tablet 500 mg        500 mg Per Tube Every 12 hours 02/03/23 1406 02/06/23 2148   02/02/23 1815  vancomycin (VANCOCIN) 50 mg/mL oral solution SOLN 125 mg  Status:  Discontinued        125 mg Per Tube Every 6 hours 02/02/23 1717 02/08/23 1325   02/02/23 1545  vancomycin (VANCOCIN) capsule 125 mg  Status:  Discontinued        125 mg Oral 4 times daily 02/02/23 1530 02/02/23 1717   01/27/23 1430  cefTRIAXone (ROCEPHIN) 2 g in sodium chloride 0.9 %  100 mL IVPB        2 g 200 mL/hr over 30 Minutes Intravenous Every 24 hours 01/27/23 1340 02/05/23 1403   01/27/23 1400  metroNIDAZOLE (FLAGYL) IVPB 500 mg  Status:  Discontinued  500 mg 100 mL/hr over 60 Minutes Intravenous Every 12 hours 01/27/23 1340 02/03/23 1406   01/27/23 0545  vancomycin (VANCOREADY) IVPB 1250 mg/250 mL        1,250 mg 166.7 mL/hr over 90 Minutes Intravenous  Once 01/27/23 0542 01/27/23 0809   01/27/23 0545  ceFEPIme (MAXIPIME) 2 g in sodium chloride 0.9 % 100 mL IVPB        2 g 200 mL/hr over 30 Minutes Intravenous  Once 01/27/23 0542 01/27/23 0621   01/27/23 0545  metroNIDAZOLE (FLAGYL) IVPB 500 mg        500 mg 100 mL/hr over 60 Minutes Intravenous  Once 01/27/23 0542 01/27/23 0741        I have personally reviewed the following labs and images: CBC: Recent Labs  Lab 03/01/23 0445 03/02/23 0001 03/03/23 0455 03/04/23 0530 03/05/23 0556  WBC 15.1* 12.0* 11.1* 12.4* 10.5  NEUTROABS  --   --  6.8  --   --   HGB 10.2* 10.1* 10.5* 11.1* 11.3*  HCT 32.5* 31.8* 32.9* 34.6* 35.4*  MCV 86.0 85.5 86.6 86.1 85.3  PLT 937* 969* 1,082* 1,268* 1,212*   BMP &GFR Recent Labs  Lab 03/02/23 0001 03/03/23 0455 03/04/23 0530 03/05/23 0556  NA 131* 131* 131* 130*  K 4.2 3.9 4.1 3.6  CL 94* 93* 94* 90*  CO2 26 26 26 24   GLUCOSE 108* 89 106* 113*  BUN 7 6 <5* 6  CREATININE 0.61 0.57 0.53 0.54  CALCIUM 9.6 9.8 9.7 10.1  MG  --  1.5* 1.9 1.6*  PHOS  --   --   --  4.3   Estimated Creatinine Clearance: 67.3 mL/min (by C-G formula based on SCr of 0.54 mg/dL). Liver & Pancreas: Recent Labs  Lab 03/02/23 0001 03/03/23 0455 03/05/23 0556  AST 15 19  --   ALT 8 9  --   ALKPHOS 151* 161*  --   BILITOT 0.5 0.3  --   PROT 7.0 6.8  --   ALBUMIN 2.6* 2.7* 3.0*   No results for input(s): "LIPASE", "AMYLASE" in the last 168 hours. No results for input(s): "AMMONIA" in the last 168 hours. Diabetic: No results for input(s): "HGBA1C" in the last 72  hours. Recent Labs  Lab 03/05/23 1954 03/05/23 2358 03/06/23 0349 03/06/23 0802 03/06/23 1203  GLUCAP 94 88 91 99 96   Cardiac Enzymes: No results for input(s): "CKTOTAL", "CKMB", "CKMBINDEX", "TROPONINI" in the last 168 hours. No results for input(s): "PROBNP" in the last 8760 hours. Coagulation Profile: No results for input(s): "INR", "PROTIME" in the last 168 hours. Thyroid Function Tests: No results for input(s): "TSH", "T4TOTAL", "FREET4", "T3FREE", "THYROIDAB" in the last 72 hours. Lipid Profile: No results for input(s): "CHOL", "HDL", "LDLCALC", "TRIG", "CHOLHDL", "LDLDIRECT" in the last 72 hours. Anemia Panel: No results for input(s): "VITAMINB12", "FOLATE", "FERRITIN", "TIBC", "IRON", "RETICCTPCT" in the last 72 hours. Urine analysis:    Component Value Date/Time   COLORURINE AMBER (A) 01/27/2023 0514   APPEARANCEUR HAZY (A) 01/27/2023 0514   LABSPEC 1.020 01/27/2023 0514   PHURINE 5.0 01/27/2023 0514   GLUCOSEU NEGATIVE 01/27/2023 0514   GLUCOSEU NEG mg/dL 69/62/9528 4132   HGBUR NEGATIVE 01/27/2023 0514   BILIRUBINUR NEGATIVE 01/27/2023 0514   KETONESUR 5 (A) 01/27/2023 0514   PROTEINUR 30 (A) 01/27/2023 0514   UROBILINOGEN 0.2 12/26/2013 1530   NITRITE NEGATIVE 01/27/2023 0514   LEUKOCYTESUR TRACE (A) 01/27/2023 0514   Sepsis Labs: Invalid input(s): "PROCALCITONIN", "LACTICIDVEN"  Microbiology: Recent  Results (from the past 240 hour(s))  Culture, body fluid w Gram Stain-bottle     Status: None   Collection Time: 02/27/23  1:00 PM   Specimen: Pleura  Result Value Ref Range Status   Specimen Description PLEURAL  Final   Special Requests LEFT  Final   Culture   Final    NO GROWTH 5 DAYS Performed at [facility], [address], [place], [place] [address]    Report Status 03/04/2023 FINAL  Final  Gram stain     Status: None   Collection Time: 02/27/23  1:00 PM   Specimen: Pleura  Result Value Ref Range Status   Specimen Description PLEURAL   Final   Special Requests LEFT  Final   Gram Stain   Final    FEW WBC SEEN NO ORGANISMS SEEN Performed at [facility], [address], [place], [place] [address]    Report Status 02/27/2023 FINAL  Final    Radiology Studies: No results found.    [employer] T. Ndidi Nesby [facility]  If 7PM-7AM, please contact night-coverage [email or web address] 03/06/2023, 2:40 PM

## 2023-03-07 DIAGNOSIS — B49 Unspecified mycosis: Secondary | ICD-10-CM | POA: Diagnosis not present

## 2023-03-07 DIAGNOSIS — I469 Cardiac arrest, cause unspecified: Secondary | ICD-10-CM | POA: Diagnosis not present

## 2023-03-07 DIAGNOSIS — A0472 Enterocolitis due to Clostridium difficile, not specified as recurrent: Secondary | ICD-10-CM | POA: Diagnosis not present

## 2023-03-07 DIAGNOSIS — D62 Acute posthemorrhagic anemia: Secondary | ICD-10-CM | POA: Diagnosis not present

## 2023-03-07 LAB — GLUCOSE, CAPILLARY
Glucose-Capillary: 91 mg/dL (ref 70–99)
Glucose-Capillary: 108 mg/dL — ABNORMAL HIGH (ref 70–99)
Glucose-Capillary: 110 mg/dL — ABNORMAL HIGH (ref 70–99)
Glucose-Capillary: 102 mg/dL — ABNORMAL HIGH (ref 70–99)
Glucose-Capillary: 102 mg/dL — ABNORMAL HIGH (ref 70–99)

## 2023-03-07 LAB — CBC
HCT: 34.4 % — ABNORMAL LOW (ref 36.0–46.0)
Hemoglobin: 10.7 g/dL — ABNORMAL LOW (ref 12.0–15.0)
MCH: 26.9 pg (ref 26.0–34.0)
MCHC: 31.1 g/dL (ref 30.0–36.0)
MCV: 86.4 fL (ref 80.0–100.0)
Platelets: 1210 10*3/uL (ref 150–400)
RBC: 3.98 MIL/uL (ref 3.87–5.11)
RDW: 15.4 % (ref 11.5–15.5)
WBC: 9.8 10*3/uL (ref 4.0–10.5)
nRBC: 0 % (ref 0.0–0.2)

## 2023-03-07 LAB — RENAL FUNCTION PANEL
Albumin: 3 g/dL — ABNORMAL LOW (ref 3.5–5.0)
Anion gap: 11 (ref 5–15)
BUN: 6 mg/dL (ref 6–20)
CO2: 27 mmol/L (ref 22–32)
Calcium: 10.2 mg/dL (ref 8.9–10.3)
Chloride: 93 mmol/L — ABNORMAL LOW (ref 98–111)
Creatinine, Ser: 0.57 mg/dL (ref 0.44–1.00)
GFR, Estimated: >60 mL/min (ref >60)
Glucose, Bld: 87 mg/dL (ref 70–99)
Phosphorus: 4.6 mg/dL (ref 2.5–4.6)
Potassium: 4.7 mmol/L (ref 3.5–5.1)
Sodium: 131 mmol/L — ABNORMAL LOW (ref 135–145)

## 2023-03-07 LAB — MAGNESIUM: Magnesium: 1.7 mg/dL (ref 1.7–2.4)

## 2023-03-07 MED ORDER — DIPHENOXYLATE-ATROPINE 2.5-0.025 MG PO TABS
1.0000 | ORAL_TABLET | Freq: Four times a day (QID) | ORAL | Status: DC
Start: 2023-03-07 — End: 2023-03-19
  Administered 2023-03-07 – 2023-03-19 (×47): 1 via ORAL
  Filled 2023-03-07 (×47): qty 1

## 2023-03-07 MED ORDER — INSULIN ASPART 100 UNIT/ML IJ SOLN: 0.0000 [IU] | Freq: Three times a day (TID) | INTRAMUSCULAR | Status: DC

## 2023-03-07 MED ORDER — ENOXAPARIN SODIUM 60 MG/0.6ML IJ SOSY
60.0000 mg | PREFILLED_SYRINGE | Freq: Two times a day (BID) | INTRAMUSCULAR | Status: DC
  Administered 2023-03-07 – 2023-03-13 (×14): 60 mg via SUBCUTANEOUS
  Filled 2023-03-07 (×14): qty 0.6

## 2023-03-07 NOTE — Progress Notes (Signed)
Patient ID: [name], female   DOB: 10-12-1970, 52 y.o.   MRN: [id number] 17 Days Post-Op    Subjective: No new issues ROS negative except as listed above. Objective: Vital signs in last 24 hours: Temp:  [97.6 F (36.4 C)-98.2 F (36.8 C)] 97.9 F (36.6 C) (11/04 0422) Pulse Rate:  [88-131] 93 (11/04 0422) Resp:  [11-15] 11 (11/04 0422) BP: (109-124)/(54-67) 117/62 (11/04 0422) SpO2:  [96 %-97 %] 96 % (11/04 0422) Last BM Date : 03/06/23  Intake/Output from previous day: 11/03 0701 - 11/04 0700 In: 480 [P.O.:480] Out: 1630 [Urine:800; Drains:30; Stool:800] Intake/Output this shift: No intake/output data recorded.  Abd soft, midline nearly healed, ostomy with output  Lab Results: CBC  Recent Labs    03/05/23 0556 03/07/23 0436  WBC 10.5 9.8  HGB 11.3* 10.7*  HCT 35.4* 34.4*  PLT 1,212* 1,210*   BMET Recent Labs    03/05/23 0556 03/07/23 0436  NA 130* 131*  K 3.6 4.7  CL 90* 93*  CO2 24 27  GLUCOSE 113* 87  BUN 6 6  CREATININE 0.54 0.57  CALCIUM 10.1 10.2   PT/INR No results for input(s): "LABPROT", "INR" in the last 72 hours. ABG No results for input(s): "PHART", "HCO3" in the last 72 hours.  Invalid input(s): "PCO2", "PO2"  Studies/Results: No results found.  Anti-infectives: Anti-infectives (From admission, onward)    Start     Dose/Rate Route Frequency Ordered Stop   03/28/23 1000  vancomycin (VANCOCIN) capsule 125 mg       Placed in "Followed by" Linked Group   125 mg Oral Every 3 DAYS 03/01/23 0750 04/06/23 0959   03/20/23 1000  vancomycin (VANCOCIN) capsule 125 mg       Placed in "Followed by" Linked Group   125 mg Oral Every other day 03/01/23 0750 03/28/23 0959   03/13/23 1000  vancomycin (VANCOCIN) capsule 125 mg       Placed in "Followed by" Linked Group   125 mg Oral Daily 03/01/23 0750 03/20/23 0959   03/06/23 1000  vancomycin (VANCOCIN) capsule 125 mg       Placed in "Followed by" Linked Group   125 mg Oral 2 times daily  03/01/23 0750 03/13/23 0959   03/01/23 1000  vancomycin (VANCOCIN) capsule 125 mg       Placed in "Followed by" Linked Group   125 mg Oral 4 times daily 03/01/23 0750 03/05/23 2134   02/18/23 1800  vancomycin (VANCOCIN) 50 mg/mL oral solution SOLN 125 mg        125 mg Oral 4 times daily 02/18/23 1439 02/26/23 0959   02/18/23 0400  micafungin (MYCAMINE) 150 mg in sodium chloride 0.9 % 100 mL IVPB        150 mg 107.5 mL/hr over 1 Hours Intravenous Every 24 hours 02/17/23 0936     02/17/23 0900  vancomycin (VANCOREADY) IVPB 750 mg/150 mL  Status:  Discontinued        750 mg 150 mL/hr over 60 Minutes Intravenous Every 8 hours 02/17/23 0204 02/17/23 0947   02/17/23 0400  micafungin (MYCAMINE) 100 mg in sodium chloride 0.9 % 100 mL IVPB  Status:  Discontinued        100 mg 105 mL/hr over 1 Hours Intravenous Every 24 hours 02/16/23 0353 02/17/23 0936   02/16/23 1045  vancomycin (VANCOCIN) 50 mg/mL oral solution SOLN 125 mg  Status:  Discontinued        125 mg Per Tube 4 times daily 02/16/23  1610 02/18/23 1439   02/16/23 0415  micafungin (MYCAMINE) 100 mg in sodium chloride 0.9 % 100 mL IVPB        100 mg 105 mL/hr over 1 Hours Intravenous STAT 02/16/23 0353 02/16/23 0610   02/15/23 1300  vancomycin (VANCOREADY) IVPB 750 mg/150 mL  Status:  Discontinued        750 mg 150 mL/hr over 60 Minutes Intravenous Every 12 hours 02/14/23 1156 02/17/23 0204   02/14/23 1130  vancomycin (VANCOCIN) IVPB 1000 mg/200 mL premix        1,000 mg 200 mL/hr over 60 Minutes Intravenous  Once 02/14/23 1043 02/14/23 1249   02/11/23 2000  cefTRIAXone (ROCEPHIN) 2 g in sodium chloride 0.9 % 100 mL IVPB  Status:  Discontinued       Note to Pharmacy: Pharmacy may adjust dosing strength / duration / interval for maximal efficacy   2 g 200 mL/hr over 30 Minutes Intravenous Every 24 hours 02/11/23 1622 02/28/23 0852   02/10/23 1600  metroNIDAZOLE (FLAGYL) IVPB 500 mg  Status:  Discontinued        500 mg 100 mL/hr over 60  Minutes Intravenous Every 12 hours 02/10/23 1444 02/28/23 0857   02/08/23 1800  vancomycin (VANCOCIN) 50 mg/mL oral solution SOLN 500 mg        500 mg Per Tube Every 6 hours 02/08/23 1325 02/12/23 1759   02/03/23 2200  metroNIDAZOLE (FLAGYL) tablet 500 mg        500 mg Per Tube Every 12 hours 02/03/23 1406 02/06/23 2148   02/02/23 1815  vancomycin (VANCOCIN) 50 mg/mL oral solution SOLN 125 mg  Status:  Discontinued        125 mg Per Tube Every 6 hours 02/02/23 1717 02/08/23 1325   02/02/23 1545  vancomycin (VANCOCIN) capsule 125 mg  Status:  Discontinued        125 mg Oral 4 times daily 02/02/23 1530 02/02/23 1717   01/27/23 1430  cefTRIAXone (ROCEPHIN) 2 g in sodium chloride 0.9 % 100 mL IVPB        2 g 200 mL/hr over 30 Minutes Intravenous Every 24 hours 01/27/23 1340 02/05/23 1403   01/27/23 1400  metroNIDAZOLE (FLAGYL) IVPB 500 mg  Status:  Discontinued        500 mg 100 mL/hr over 60 Minutes Intravenous Every 12 hours 01/27/23 1340 02/03/23 1406   01/27/23 0545  vancomycin (VANCOREADY) IVPB 1250 mg/250 mL        1,250 mg 166.7 mL/hr over 90 Minutes Intravenous  Once 01/27/23 0542 01/27/23 0809   01/27/23 0545  ceFEPIme (MAXIPIME) 2 g in sodium chloride 0.9 % 100 mL IVPB        2 g 200 mL/hr over 30 Minutes Intravenous  Once 01/27/23 0542 01/27/23 0621   01/27/23 0545  metroNIDAZOLE (FLAGYL) IVPB 500 mg        500 mg 100 mL/hr over 60 Minutes Intravenous  Once 01/27/23 0542 01/27/23 0741       Assessment/Plan: Refractory Crohn's disease 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 Luisa Hart 10/11 - no signs of undrained abdominal collection on CT 10/16 - TEE negative for endocarditis  - CT abdomen/pelvis 10/15 for leukocytosis showed reduced mesenteric fluid collections, pleural effusions, mild ileus.  - regular diet - ostomy output down to 800/24u - continue  immodium - Continue drain - continue medical treatment for  C.diff per ID - mobilize - WOC following for new ileostomy; D/C wound VAC and start daily moist-to-dry dressing changes.  - [facility]CS will continue to see the patient at least once weekly while she is admitted.   FEN: reg diet VTE: Lovenox 70 mg BID- acute DVT noted in BUE 10/19 ID: extended course of vanc per ID.  - per TRH -  Acute DVT/PEs - [place] 10/19 with L brachial DVT and R internal jugular and brachial DVTs  Splenic infarcts - nothing surgical recommended at this time but agree with ID need to eval etiology of emboli  COPD HTN Chronic pain  Collagen vascular disease T2DM Seizure disorder  LOS: 39 days    Violeta Gelinas, MD, MPH, FACS Trauma & General Surgery Use [email or web address] to contact on call provider  03/07/2023

## 2023-03-07 NOTE — Progress Notes (Signed)
Physical Therapy Treatment Patient Details Name: [name] MRN: [id number] DOB: 26-Nov-1970 Today's Date: 03/07/2023   History of Present Illness 52 y.o. female presents to [facility] hospital on 01/27/2023 after being found unresponsive at home down in a pool of bloody vomit/stool. CPR was initiated upon arrival to ED, pt intubated and started on pressors. Pt undergoing management for inflammatory vs infections colitis. Pt extubated on 10/2. 10/10 underwent repeat proctosigmoidoscopy for ongoing diarrhea. Diffuse severe inflammation identified due to combination of Crohn's disease, active C. difficile infection and possible ischemic colitis from prior hypotension on presentation. 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 10/13 became tachycardic and hypotensive.  More lethargic and transferred back to ICU. On 10/15, blood cultures revealed fungemia. On 10/17, CT of abdomen and pelvis notable for septic emboli. Successful Left subclavian CVC placement with IR on 10/21. PMH includes COPD, Crohn's disease, HTN, DMII.    PT Comments  Pt required supervision bed mobility, min assist sit to stand, and CGA amb 20' with RW. Pt fatigued quickly. HR in 120s during activity. Pt declined sitting up in recliner, stating one day she almost slid out of it and it scares her. Pt returned to bed at end of session.     If plan is discharge home, recommend the following: A lot of help with bathing/dressing/bathroom;Two people to help with walking and/or transfers;Assistance with cooking/housework;Direct supervision/assist for medications management;Assist for transportation;Supervision due to cognitive status;Direct supervision/assist for financial management;Help with stairs or ramp for entrance   Can travel by private vehicle        Equipment Recommendations  Rolling walker (2 wheels)    Recommendations for Other Services       Precautions / Restrictions Precautions Precautions: Fall Precaution  Comments: watch HR/RR, L bulb drain, new colostomy Restrictions Weight Bearing Restrictions: No     Mobility  Bed Mobility Overal bed mobility: Needs Assistance Bed Mobility: Supine to Sit, Sit to Supine     Supine to sit: Supervision, Used rails, HOB elevated Sit to supine: Supervision   General bed mobility comments: increased time    Transfers Overall transfer level: Needs assistance Equipment used: Rolling walker (2 wheels) Transfers: Sit to/from Stand Sit to Stand: Min assist           General transfer comment: assist to power up. Pt pulling up on RW.    Ambulation/Gait Ambulation/Gait assistance: Contact guard assist Gait Distance (Feet): 20 Feet Assistive device: Rolling walker (2 wheels) Gait Pattern/deviations: Decreased stride length, Step-through pattern Gait velocity: decreased Gait velocity interpretation: <1.31 ft/sec, indicative of household ambulator   General Gait Details: slow, steady gait with RW. Distance limited by fatigue. HR in 120s.   Stairs             Wheelchair Mobility     Tilt Bed    Modified Rankin (Stroke Patients Only)       Balance Overall balance assessment: Needs assistance Sitting-balance support: Feet supported, No upper extremity supported Sitting balance-Leahy Scale: Fair     Standing balance support: Bilateral upper extremity supported, Reliant on assistive device for balance, During functional activity Standing balance-Leahy Scale: Poor                              Cognition Arousal: Alert Behavior During Therapy: WFL for tasks assessed/performed Overall Cognitive Status: Within Functional Limits for tasks assessed  Exercises      General Comments General comments (skin integrity, edema, etc.): HR in 120s during activity      Pertinent Vitals/Pain Pain Assessment Pain Assessment: Faces Faces Pain Scale: Hurts little  more Pain Location: abdomen Pain Descriptors / Indicators: Grimacing, Discomfort Pain Intervention(s): Limited activity within patient's tolerance, Monitored during session    Home Living                          Prior Function            PT Goals (current goals can now be found in the care plan section) Acute Rehab PT Goals Patient Stated Goal: home Progress towards PT goals: Progressing toward goals    Frequency    Min 1X/week      PT Plan      Co-evaluation              AM-PAC PT "6 Clicks" Mobility   Outcome Measure  Help needed turning from your back to your side while in a flat bed without using bedrails?: A Little Help needed moving from lying on your back to sitting on the side of a flat bed without using bedrails?: A Little Help needed moving to and from a bed to a chair (including a wheelchair)?: A Little Help needed standing up from a chair using your arms (e.g., wheelchair or bedside chair)?: A Little Help needed to walk in hospital room?: A Little Help needed climbing 3-5 steps with a railing? : Total 6 Click Score: 16    End of Session Equipment Utilized During Treatment: Gait belt Activity Tolerance: Patient tolerated treatment well Patient left: in bed;with call bell/phone within reach Nurse Communication: Mobility status;Other (comment) (Pt asking for bath and bed linen change.) PT Visit Diagnosis: Other abnormalities of gait and mobility (R26.89);Muscle weakness (generalized) (M62.81)     Time: 4098-1191 PT Time Calculation (min) (ACUTE ONLY): 14 min  Charges:    $Gait Training: 8-22 mins PT General Charges $$ ACUTE PT VISIT: 1 Visit                     Ferd Glassing., PT  Office # [phone]    Ilda Foil 03/07/2023, 12:23 PM

## 2023-03-07 NOTE — Plan of Care (Signed)

## 2023-03-07 NOTE — Progress Notes (Addendum)
Mobility Specialist Progress Note:   03/07/23 1613  Mobility  Activity Ambulated with assistance in room  Level of Assistance Contact guard assist, steadying assist  Assistive Device Front wheel walker  Distance Ambulated (ft) 60 ft  Activity Response Tolerated well  Mobility Referral Yes  $Mobility charge 1 Mobility  Mobility Specialist Start Time (ACUTE ONLY) 1500  Mobility Specialist Stop Time (ACUTE ONLY) 1517  Mobility Specialist Time Calculation (min) (ACUTE ONLY) 17 min   Pre Mobility: 120 HR  During Mobility: 143 HR  Post Mobility: 124 HR  Pt received in bed, agreeable to mobility with encouragement. Pt refusing hallway ambulation but able to tolerate increase gait distance in room by walking to door 2x from bedside. Pt c/o slight dizziness towards EOS, otherwise asx throughout. Pt returned to supine position asymptomatic with call bell in reach and all needs met.    Leory Plowman  Mobility Specialist Please contact via [employer] office at [phone]

## 2023-03-07 NOTE — Progress Notes (Signed)
PROGRESS NOTE  [name] ZOX:[id number] DOB: 1970/11/19   PCP: [name], PA-C  Patient is from: Home  DOA: 01/27/2023 LOS: 39  Chief complaints Chief Complaint  Patient presents with   Respiratory Arrest     Brief Narrative / Interim history: 52 year old female with past medical history of COPD, Crohn's on Skyrizi, hypertension, type 2 diabetes mellitus who initially presented to the after being found unresponsive. Per family, patient has had bloody emesis with diarrhea over the past few days. Initial systolics in the 60s in the ED, and patient was minimally responsive. In the emergency room, patient was intubated, and patient was admitted to the ICU for further evaluation and management for shock and altered mental status.    01/27/2023: Admitted to ICU and intubated.  Felt to have septic shock of unclear source which resolved antibiotic therapy and fluid resuscitation.  Suspicion was that she may have had C. difficile colitis despite negative toxin.  She was treated with vancomycin. 9/29 Off pressors 10/2: Extubated 10/3 transfer to floor. 10/10 underwent repeat proctosigmoidoscopy for ongoing diarrhea.  Diffuse severe inflammation identified due to combination of Crohn's disease, active C. difficile infection and possible ischemic colitis from prior hypotension on presentation. 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  Intraoperative findings include frank necrosis with pelvic abscess formation and significant colitis. 10/12 some dilated small bowel with minimal OG output.  Continued observation advised.  Having significant postoperative pain. 10/13 became tachycardic and hypotensive.  More lethargic.  Transferred back to ICU. 10/15 blood cultures reports fungemia, resume PO vancomycin 10/17 CT abdomen pelvis notable for septic emboli - CTA chest on hold given IV access issues 10/18 TEE negative for endocarditis/bubble study - NG out for TEE probe.  Advance  diet, hoping to avoid replacement of NG tube 10/19 wound VAC dysfunctional, surgery switching to wet-to-dry over the weekend -pain poorly controlled today, add oxycodone (medication lists this is an allergy but patient verbally approved) 10/20 pain control improving, transition from IV fentanyl to Dilaudid, continue p.o. oxycodone, restart home anxiolytics with Xanax 10/21 Successful left subclavian CVC placement with IR Therapy recommended CIR.  Subjective: Seen and examined earlier this morning.  No major events overnight of this morning.  No complaints.  Pain improved.  Tachycardia improved as well.  He heart rate was in 90s earlier this morning although it went up to 120s later.   Objective: Vitals:   03/07/23 0811 03/07/23 0838 03/07/23 0919 03/07/23 1224  BP:   (!) 133/51 (!) 127/55  Pulse: (!) 129 (!) 126    Resp:  13 16 13   Temp:    98.1 F (36.7 C)  TempSrc:    Oral  SpO2:  96%    Weight:      Height:        Examination:  GENERAL: No apparent distress.  Nontoxic. HEENT: MMM.  Vision and hearing grossly intact.  No tenderness with palpation over temporal regions. NECK: Supple.  No apparent JVD.  RESP:  No IWOB.  Fair aeration bilaterally. CVS: HR in 90s.  Regular rhythm.  Heart sounds normal.  ABD/GI/GU: BS+.  Dressing over laparotomy wound DCI.  Ileostomy bag with liquid stool.  JP drain to LLQ. MSK/EXT:  Moves extremities. No apparent deformity. No edema.  SKIN: no apparent skin lesion or wound NEURO: Awake, alert and oriented appropriately.  No apparent focal neuro deficit. PSYCH: Calm. Normal affect.    Microbiology summarized: 9/26-COVID-19, influenza and RSV PCR nonreactive 9/26 and 10/13-MRSA PCR screen negative 9/26-blood  culture with staph hominis in 1 out of 2 bottles likely contaminant 9/29-C. difficile PCR positive 9/29-GIP negative 10/12-blood culture with Candida glabrata in 1 out of 2 bottles 10/17-blood cultures NGTD 10/27-pleural fluid culture  NGTD  Assessment and plan: Septic shock, multifactorial-including C. difficile colitis, fungemia, intra-abdominal abscess Fungemia, candida glabrata, likely POA Cdiff Colitis: POA.  Completed treatment course. Septic emboli, thrombocytosis Intra-abdominal infection/abscess -Culture data as above. -S/p laparotomy with total colectomy with ileostomy and LLQ JP drain -Received ceftriaxone and Flagyl for extended course until 10/28. -TTE/TEE negative for vegetation.  Bubble study negative. -Appreciate input by ID -Continue antifungal for 6 weeks for fungemia.  Started on 10/16>>>. -Complete 14 days of p.o. vancomycin followed by taper for C. Difficile-completed. -Ophthalmology or neuro consultation.  -Evaluated by ophthalmology on 10/23.  No evidence of fungal infiltrate on funduscopic evaluation -Left subclavian tunneled CVC placed 02/21/2023 with IR -Leukocytosis resolved.  Still with diarrhea probably due to colectomy. -Wound care per general surgery and wound care staff.  WTD daily -Changed oxycodone to p.o. Dilaudid for moderate pain on 10/3.  She believes oxycodone is causing headache -Continue fentanyl patch.  Continue IV Dilaudid as needed.  -Added Advil for mild pain on 10/3.  She reports taking Advil/ibuprofen without problem at home.  Intractable abdominal pain, multifactorial- Left-sided chest pain: Aggravated by stress.  Also reproducible.  EKG without acute ischemic finding.  Resolved. Left-sided headache-reports migraine as a child.  She thinks oxycodone is contributing.  Headache resolved -Pain regimen as above. -Continue home amitriptyline, gabapentin and Cymbalta -Discussed the importance of weaning of IV pain medication as early as possible.  Moderate to severe malnutrition Postoperative short bowel syndrome/high output ostomy Malabsorption likely acute on chronic -Increase Lomotil to 4 times daily -Continue Imodium 4 mg 4 times daily -Encourage oral  intake. -Optimize nutrition -Not a candidate for TPN due to fungemia   Left-sided pleural effusion/iatrogenic pneumothorax, small: S/p thoracocentesis on 10/27.  Fluid is exudative by LDH.  Fluid culture NGTD.  Subsequent chest x-ray on 10/28 without pneumothorax.  Cytology negative. 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  Previous provider discussed with Dr. Cardell Peach with urology who recommended outpatient evaluation with cystoscopy  Acute pulmonary emboli/right upper extremity DVT -Continue Lovenox   Thrombocytosis: Evaluated by hematology and felt to be inflammatory response.  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   Recent Labs  Lab 03/01/23 0445 03/02/23 0001 03/03/23 0455 03/04/23 0530 03/05/23 0556 03/07/23 0436  PLT 937* 969* 1,082* 1,268* 1,212* 1,210*  -Continue monitoring -Outpatient follow-up   Longstanding Crohn's disease: Not chronically on steroids so less concern for adrenal insufficiency. S/p colectomy -Hold steroids/Skyrizi in the setting of ongoing infection.   Sinus tachycardia: Likely due to acute illness and possible dehydration.  TSH normal.  He had some pericardial effusion without tamponade.  Repeat CT on 10/25 did not reveal pericardial effusion. -Increased metoprolol to 62.5 mg twice daily  Pericardial effusion without tamponade: Initially seen on TTE.  Was not seen on CTA on 10/25  -May need outpatient echocardiogram to confirm resolution.  Cardiac arrest secondary to septic shock requiring CPR on admission   Controlled DM-2 with hyperglycemia: A1c 5.8% on 9/26. Recent Labs  Lab 03/06/23 2031 03/06/23 2333 03/07/23 0419 03/07/23 0822 03/07/23 1228  GLUCAP 86 115* 91 108* 110*  -Continue current insulin regimen   History of seizures -Continue PTA keppra/gabapentin   History of anxiety -Resume home Xanax, Cymbalta, hydroxyzine  History of  hypertension -Metoprolol as above   History of COPD: Stable -Continue Yupelri daily and xopenex PRN   GERD -Continue PTA PPI   Normocytic Anemia: Stable -Transfuse for Hb <7 or hemodynamically significant bleeding  Hypokalemia/hypomagnesemia -Monitor replenish as appropriate   Inadequate oral intake/weight loss: Lost about 30 pounds so far. Body mass index is 23.13 kg/m. Nutrition Problem: Inadequate oral intake Etiology: inability to eat Signs/Symptoms: NPO status Interventions: Refer to RD note for recommendations   DVT prophylaxis:  SCDs Start: 01/27/23 1111  Code Status: Full code Family Communication: None at bedside Level of care: Telemetry Cardiac Status is: Inpatient Remains inpatient appropriate because: Sepsis, tachycardia   Final disposition: CIR Consultants:  Pulmonology General Surgery Hematology Urology Infectious disease Ophthalmology   35 minutes with more than 50% spent in reviewing records, counseling patient/family and coordinating care.   Sch Meds:  Scheduled Meds:  acetaminophen  650 mg Oral Q6H WA   amitriptyline  25 mg Oral QHS   Chlorhexidine Gluconate Cloth  6 each Topical Daily   diphenoxylate-atropine  1 tablet Oral TID   DULoxetine  30 mg Oral Daily   enoxaparin (LOVENOX) injection  60 mg Subcutaneous Q12H   feeding supplement  237 mL Oral TID BM   fentaNYL  1 patch Transdermal Q72H   folic acid  1 mg Oral Daily   gabapentin  100 mg Oral TID   Gerhardt's butt cream   Topical BID   insulin aspart  0-9 Units Subcutaneous TID WC   levETIRAcetam  750 mg Oral BID   lidocaine  2 patch Transdermal Q24H   lidocaine (PF)  8 mL Intradermal Once   loperamide  4 mg Oral QID   loratadine  10 mg Oral Daily   methocarbamol  1,000 mg Oral TID   metoprolol tartrate  62.5 mg Oral BID   multivitamin with minerals  1 tablet Oral Daily   pantoprazole  40 mg Oral Daily   revefenacin  175 mcg Nebulization Daily   thiamine  100 mg Oral Daily    vancomycin  125 mg Oral BID   Followed by   Melene Muller ON 03/13/2023] vancomycin  125 mg Oral Daily   Followed by   Melene Muller ON 03/20/2023] vancomycin  125 mg Oral QODAY   Followed by   Melene Muller ON 03/28/2023] vancomycin  125 mg Oral Q3 days   Continuous Infusions:  micafungin (MYCAMINE) 150 mg in sodium chloride 0.9 % 100 mL IVPB 150 mg (03/07/23 0835)   PRN Meds:.ALPRAZolam, bismuth subsalicylate, camphor-menthol, dextrose, HYDROmorphone (DILAUDID) injection, HYDROmorphone, ibuprofen, levalbuterol, magic mouthwash, metoprolol tartrate, ondansetron (ZOFRAN) IV, mouth rinse  Antimicrobials: Anti-infectives (From admission, onward)    Start     Dose/Rate Route Frequency Ordered Stop   03/28/23 1000  vancomycin (VANCOCIN) capsule 125 mg       Placed in "Followed by" Linked Group   125 mg Oral Every 3 DAYS 03/01/23 0750 04/06/23 0959   03/20/23 1000  vancomycin (VANCOCIN) capsule 125 mg       Placed in "Followed by" Linked Group   125 mg Oral Every other day 03/01/23 0750 03/28/23 0959   03/13/23 1000  vancomycin (VANCOCIN) capsule 125 mg       Placed in "Followed by" Linked Group   125 mg Oral Daily 03/01/23 0750 03/20/23 0959   03/06/23 1000  vancomycin (VANCOCIN) capsule 125 mg       Placed in "Followed by" Linked Group   125 mg Oral 2 times daily 03/01/23  0750 03/13/23 0959   03/01/23 1000  vancomycin (VANCOCIN) capsule 125 mg       Placed in "Followed by" Linked Group   125 mg Oral 4 times daily 03/01/23 0750 03/05/23 2134   02/18/23 1800  vancomycin (VANCOCIN) 50 mg/mL oral solution SOLN 125 mg        125 mg Oral 4 times daily 02/18/23 1439 02/26/23 0959   02/18/23 0400  micafungin (MYCAMINE) 150 mg in sodium chloride 0.9 % 100 mL IVPB        150 mg 107.5 mL/hr over 1 Hours Intravenous Every 24 hours 02/17/23 0936     02/17/23 0900  vancomycin (VANCOREADY) IVPB 750 mg/150 mL  Status:  Discontinued        750 mg 150 mL/hr over 60 Minutes Intravenous Every 8 hours 02/17/23 0204  02/17/23 0947   02/17/23 0400  micafungin (MYCAMINE) 100 mg in sodium chloride 0.9 % 100 mL IVPB  Status:  Discontinued        100 mg 105 mL/hr over 1 Hours Intravenous Every 24 hours 02/16/23 0353 02/17/23 0936   02/16/23 1045  vancomycin (VANCOCIN) 50 mg/mL oral solution SOLN 125 mg  Status:  Discontinued        125 mg Per Tube 4 times daily 02/16/23 0953 02/18/23 1439   02/16/23 0415  micafungin (MYCAMINE) 100 mg in sodium chloride 0.9 % 100 mL IVPB        100 mg 105 mL/hr over 1 Hours Intravenous STAT 02/16/23 0353 02/16/23 0610   02/15/23 1300  vancomycin (VANCOREADY) IVPB 750 mg/150 mL  Status:  Discontinued        750 mg 150 mL/hr over 60 Minutes Intravenous Every 12 hours 02/14/23 1156 02/17/23 0204   02/14/23 1130  vancomycin (VANCOCIN) IVPB 1000 mg/200 mL premix        1,000 mg 200 mL/hr over 60 Minutes Intravenous  Once 02/14/23 1043 02/14/23 1249   02/11/23 2000  cefTRIAXone (ROCEPHIN) 2 g in sodium chloride 0.9 % 100 mL IVPB  Status:  Discontinued       Note to Pharmacy: Pharmacy may adjust dosing strength / duration / interval for maximal efficacy   2 g 200 mL/hr over 30 Minutes Intravenous Every 24 hours 02/11/23 1622 02/28/23 0852   02/10/23 1600  metroNIDAZOLE (FLAGYL) IVPB 500 mg  Status:  Discontinued        500 mg 100 mL/hr over 60 Minutes Intravenous Every 12 hours 02/10/23 1444 02/28/23 0857   02/08/23 1800  vancomycin (VANCOCIN) 50 mg/mL oral solution SOLN 500 mg        500 mg Per Tube Every 6 hours 02/08/23 1325 02/12/23 1759   02/03/23 2200  metroNIDAZOLE (FLAGYL) tablet 500 mg        500 mg Per Tube Every 12 hours 02/03/23 1406 02/06/23 2148   02/02/23 1815  vancomycin (VANCOCIN) 50 mg/mL oral solution SOLN 125 mg  Status:  Discontinued        125 mg Per Tube Every 6 hours 02/02/23 1717 02/08/23 1325   02/02/23 1545  vancomycin (VANCOCIN) capsule 125 mg  Status:  Discontinued        125 mg Oral 4 times daily 02/02/23 1530 02/02/23 1717   01/27/23 1430   cefTRIAXone (ROCEPHIN) 2 g in sodium chloride 0.9 % 100 mL IVPB        2 g 200 mL/hr over 30 Minutes Intravenous Every 24 hours 01/27/23 1340 02/05/23 1403   01/27/23 1400  metroNIDAZOLE (FLAGYL)  IVPB 500 mg  Status:  Discontinued        500 mg 100 mL/hr over 60 Minutes Intravenous Every 12 hours 01/27/23 1340 02/03/23 1406   01/27/23 0545  vancomycin (VANCOREADY) IVPB 1250 mg/250 mL        1,250 mg 166.7 mL/hr over 90 Minutes Intravenous  Once 01/27/23 0542 01/27/23 0809   01/27/23 0545  ceFEPIme (MAXIPIME) 2 g in sodium chloride 0.9 % 100 mL IVPB        2 g 200 mL/hr over 30 Minutes Intravenous  Once 01/27/23 0542 01/27/23 0621   01/27/23 0545  metroNIDAZOLE (FLAGYL) IVPB 500 mg        500 mg 100 mL/hr over 60 Minutes Intravenous  Once 01/27/23 0542 01/27/23 0741        I have personally reviewed the following labs and images: CBC: Recent Labs  Lab 03/02/23 0001 03/03/23 0455 03/04/23 0530 03/05/23 0556 03/07/23 0436  WBC 12.0* 11.1* 12.4* 10.5 9.8  NEUTROABS  --  6.8  --   --   --   HGB 10.1* 10.5* 11.1* 11.3* 10.7*  HCT 31.8* 32.9* 34.6* 35.4* 34.4*  MCV 85.5 86.6 86.1 85.3 86.4  PLT 969* 1,082* 1,268* 1,212* 1,210*   BMP &GFR Recent Labs  Lab 03/02/23 0001 03/03/23 0455 03/04/23 0530 03/05/23 0556 03/07/23 0436  NA 131* 131* 131* 130* 131*  K 4.2 3.9 4.1 3.6 4.7  CL 94* 93* 94* 90* 93*  CO2 26 26 26 24 27   GLUCOSE 108* 89 106* 113* 87  BUN 7 6 <5* 6 6  CREATININE 0.61 0.57 0.53 0.54 0.57  CALCIUM 9.6 9.8 9.7 10.1 10.2  MG  --  1.5* 1.9 1.6* 1.7  PHOS  --   --   --  4.3 4.6   Estimated Creatinine Clearance: 67.3 mL/min (by C-G formula based on SCr of 0.57 mg/dL). Liver & Pancreas: Recent Labs  Lab 03/02/23 0001 03/03/23 0455 03/05/23 0556 03/07/23 0436  AST 15 19  --   --   ALT 8 9  --   --   ALKPHOS 151* 161*  --   --   BILITOT 0.5 0.3  --   --   PROT 7.0 6.8  --   --   ALBUMIN 2.6* 2.7* 3.0* 3.0*   No results for input(s): "LIPASE",  "AMYLASE" in the last 168 hours. No results for input(s): "AMMONIA" in the last 168 hours. Diabetic: No results for input(s): "HGBA1C" in the last 72 hours. Recent Labs  Lab 03/06/23 2031 03/06/23 2333 03/07/23 0419 03/07/23 0822 03/07/23 1228  GLUCAP 86 115* 91 108* 110*   Cardiac Enzymes: No results for input(s): "CKTOTAL", "CKMB", "CKMBINDEX", "TROPONINI" in the last 168 hours. No results for input(s): "PROBNP" in the last 8760 hours. Coagulation Profile: No results for input(s): "INR", "PROTIME" in the last 168 hours. Thyroid Function Tests: No results for input(s): "TSH", "T4TOTAL", "FREET4", "T3FREE", "THYROIDAB" in the last 72 hours. Lipid Profile: No results for input(s): "CHOL", "HDL", "LDLCALC", "TRIG", "CHOLHDL", "LDLDIRECT" in the last 72 hours. Anemia Panel: No results for input(s): "VITAMINB12", "FOLATE", "FERRITIN", "TIBC", "IRON", "RETICCTPCT" in the last 72 hours. Urine analysis:    Component Value Date/Time   COLORURINE AMBER (A) 01/27/2023 0514   APPEARANCEUR HAZY (A) 01/27/2023 0514   LABSPEC 1.020 01/27/2023 0514   PHURINE 5.0 01/27/2023 0514   GLUCOSEU NEGATIVE 01/27/2023 0514   GLUCOSEU NEG mg/dL 25/95/6387 5643   HGBUR NEGATIVE 01/27/2023 0514   BILIRUBINUR NEGATIVE 01/27/2023 0514  KETONESUR 5 (A) 01/27/2023 0514   PROTEINUR 30 (A) 01/27/2023 0514   UROBILINOGEN 0.2 12/26/2013 1530   NITRITE NEGATIVE 01/27/2023 0514   LEUKOCYTESUR TRACE (A) 01/27/2023 0514   Sepsis Labs: Invalid input(s): "PROCALCITONIN", "LACTICIDVEN"  Microbiology: Recent Results (from the past 240 hour(s))  Culture, body fluid w Gram Stain-bottle     Status: None   Collection Time: 02/27/23  1:00 PM   Specimen: Pleura  Result Value Ref Range Status   Specimen Description PLEURAL  Final   Special Requests LEFT  Final   Culture   Final    NO GROWTH 5 DAYS Performed at [facility], [address] [place], [place] [address]    Report Status 03/04/2023 FINAL   Final  Gram stain     Status: None   Collection Time: 02/27/23  1:00 PM   Specimen: Pleura  Result Value Ref Range Status   Specimen Description PLEURAL  Final   Special Requests LEFT  Final   Gram Stain   Final    FEW WBC SEEN NO ORGANISMS SEEN Performed at [facility], [address], [place], [place] [address]    Report Status 02/27/2023 FINAL  Final    Radiology Studies: No results found.    Hakeem Frazzini T. Shaylyn Bawa [facility]  If 7PM-7AM, please contact night-coverage [email or web address] 03/07/2023, 2:31 PM

## 2023-03-07 NOTE — Progress Notes (Signed)
   03/07/23 1224  Assess: MEWS Score  Temp 98.1 F (36.7 C)  BP (!) 127/55  MAP (mmHg) 74  ECG Heart Rate (!) 108  Resp 13  Assess: MEWS Score  MEWS Temp 0  MEWS Systolic 0  MEWS Pulse 1  MEWS RR 1  MEWS LOC 0  MEWS Score 2  MEWS Score Color Yellow  Assess: if the MEWS score is Yellow or Red  Were vital signs accurate and taken at a resting state? Yes  Does the patient meet 2 or more of the SIRS criteria? No  MEWS guidelines implemented  No, previously yellow, continue vital signs every 4 hours  Assess: SIRS CRITERIA  SIRS Temperature  0  SIRS Pulse 1  SIRS Respirations  0  SIRS WBC 0  SIRS Score Sum  1   Pts mews previously  yellow  due to elevated HR pt expressed pain 8/10 PRN pain medicine given and scheduled metoprolol

## 2023-03-08 DIAGNOSIS — D62 Acute posthemorrhagic anemia: Secondary | ICD-10-CM | POA: Diagnosis not present

## 2023-03-08 DIAGNOSIS — B49 Unspecified mycosis: Secondary | ICD-10-CM | POA: Diagnosis not present

## 2023-03-08 DIAGNOSIS — A0472 Enterocolitis due to Clostridium difficile, not specified as recurrent: Secondary | ICD-10-CM | POA: Diagnosis not present

## 2023-03-08 DIAGNOSIS — I469 Cardiac arrest, cause unspecified: Secondary | ICD-10-CM | POA: Diagnosis not present

## 2023-03-08 LAB — GLUCOSE, CAPILLARY: Glucose-Capillary: 102 mg/dL — ABNORMAL HIGH (ref 70–99)

## 2023-03-08 MED ORDER — PROCHLORPERAZINE EDISYLATE 10 MG/2ML IJ SOLN
5.0000 mg | Freq: Four times a day (QID) | INTRAMUSCULAR | Status: DC | PRN
  Administered 2023-03-08: 5 mg via INTRAVENOUS
  Filled 2023-03-08: qty 2

## 2023-03-08 NOTE — Plan of Care (Signed)
°  Problem: Coping: °Goal: Level of anxiety will decrease °Outcome: Progressing °  °

## 2023-03-08 NOTE — Progress Notes (Signed)
Inpatient Rehabilitation Admissions Coordinator   Rehab consult received. Noted on 10/15 we consulted . Megan Salon assessed patient and spoke with her daughter, Hospital doctor. She does not have 24/7 assist after rehab and they wished to pursue SNF. Based on WOC follow up, therapy assessments and overall care needs, do not feel that she can reach independent level with CIR admit LOS. We continue to recommend SNF at this time.  Ottie Glazier, RN, MSN Rehab Admissions Coordinator [phone] 03/08/2023 2:56 PM

## 2023-03-08 NOTE — Progress Notes (Signed)
PROGRESS NOTE  [name] WUJ:[id number] DOB: 07-10-1970   PCP: [name], PA-C  Patient is from: Home  DOA: 01/27/2023 LOS: 40  Chief complaints Chief Complaint  Patient presents with   Respiratory Arrest     Brief Narrative / Interim history: 52 year old female with past medical history of COPD, Crohn's on Skyrizi, hypertension, type 2 diabetes mellitus who initially presented to the after being found unresponsive. Per family, patient has had bloody emesis with diarrhea over the past few days. Initial systolics in the 60s in the ED, and patient was minimally responsive. In the emergency room, patient was intubated, and patient was admitted to the ICU for further evaluation and management for shock and altered mental status.    01/27/2023: Admitted to ICU and intubated.  Felt to have septic shock of unclear source which resolved antibiotic therapy and fluid resuscitation.  Suspicion was that she may have had C. difficile colitis despite negative toxin.  She was treated with vancomycin. 9/29 Off pressors 10/2: Extubated 10/3 transfer to floor. 10/10 underwent repeat proctosigmoidoscopy for ongoing diarrhea.  Diffuse severe inflammation identified due to combination of Crohn's disease, active C. difficile infection and possible ischemic colitis from prior hypotension on presentation. 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  Intraoperative findings include frank necrosis with pelvic abscess formation and significant colitis. 10/12 some dilated small bowel with minimal OG output.  Continued observation advised.  Having significant postoperative pain. 10/13 became tachycardic and hypotensive.  More lethargic.  Transferred back to ICU. 10/15 blood cultures reports fungemia, resume PO vancomycin 10/17 CT abdomen pelvis notable for septic emboli - CTA chest on hold given IV access issues 10/18 TEE negative for endocarditis/bubble study - NG out for TEE probe.  Advance  diet, hoping to avoid replacement of NG tube 10/19 wound VAC dysfunctional, surgery switching to wet-to-dry over the weekend -pain poorly controlled today, add oxycodone (medication lists this is an allergy but patient verbally approved) 10/20 pain control improving, transition from IV fentanyl to Dilaudid, continue p.o. oxycodone, restart home anxiolytics with Xanax 10/21 Successful left subclavian CVC placement with IR Therapy recommended CIR. Tachycardia and pain control remains a challenge.  General surgery following intermittently.   Subjective: Seen and examined earlier this morning.  No major events overnight of this morning.  She reports significant pain across lower abdomen.  Also reports nausea despite IV Zofran.  Ostomy output about 1 L.  Excellent urine output.  Drain output about 25 cc.  Objective: Vitals:   03/07/23 2325 03/08/23 0348 03/08/23 0812 03/08/23 1252  BP: 108/65 123/66 124/71 118/62  Pulse: 92 98  (!) 109  Resp: 18 11 12 16   Temp: 97.9 F (36.6 C) 97.9 F (36.6 C) 98.5 F (36.9 C) 97.7 F (36.5 C)  TempSrc: Oral Oral Oral Oral  SpO2: 96% 97%  92%  Weight:    59.6 kg  Height:        Examination:  GENERAL: No apparent distress.  Nontoxic. HEENT: MMM.  Vision and hearing grossly intact.  No tenderness with palpation over temporal regions. NECK: Supple.  No apparent JVD.  RESP:  No IWOB.  Fair aeration bilaterally. CVS: HR in 90s.  Regular rhythm.  Heart sounds normal.  ABD/GI/GU: BS+.  Laparotomy wound.  No signs of infection.  Ileostomy bag with liquid stool.  JP drain to LLQ. MSK/EXT:  Moves extremities. No apparent deformity. No edema.  SKIN: no apparent skin lesion or wound NEURO: Awake, alert and oriented appropriately.  No apparent focal  neuro deficit. PSYCH: Calm. Normal affect.    Microbiology summarized: 9/26-COVID-19, influenza and RSV PCR nonreactive 9/26 and 10/13-MRSA PCR screen negative 9/26-blood culture with staph hominis in 1 out of 2  bottles likely contaminant 9/29-C. difficile PCR positive 9/29-GIP negative 10/12-blood culture with Candida glabrata in 1 out of 2 bottles 10/17-blood cultures NGTD 10/27-pleural fluid culture NGTD  Assessment and plan: Septic shock, multifactorial-including C. difficile colitis, fungemia, intra-abdominal abscess Fungemia, candida glabrata, likely POA Cdiff Colitis: POA.  Completed treatment course. Septic emboli, thrombocytosis Intra-abdominal infection/abscess -Culture data as above. -S/p laparotomy with total colectomy with ileostomy and LLQ JP drain -Received ceftriaxone and Flagyl for extended course until 10/28. -TTE/TEE negative for vegetation.  Bubble study negative. -Appreciate input by ID -Continue antifungal for 6 weeks for fungemia.  Started on 10/16>>>. -Complete 14 days of p.o. vancomycin followed by taper for C. Difficile-completed. -Ophthalmology or neuro consultation.  -Evaluated by ophthalmology on 10/23.  No evidence of fungal infiltrate on funduscopic evaluation -Left subclavian tunneled CVC placed 02/21/2023 with IR -Leukocytosis resolved.  Still with diarrhea probably due to colectomy. -Wound care per general surgery and wound care staff.  WTD daily -Changed oxycodone to p.o. Dilaudid for moderate pain on 10/3.  She believes oxycodone is causing headache -Continue fentanyl patch, p.o. and IV Dilaudid based on pain severity -Discontinue Advil.  Intractable abdominal pain: Likely due to the above. Left-sided chest pain: EKG and troponin negative.  Chest pain resolved.  Left-sided headache-Attributing to oxycodone.  Oxycodone stopped.  Resolved. Nausea without emesis -Pain regimen as above. -Continue home amitriptyline, gabapentin and Cymbalta -Add Compazine for refractory nausea and vomiting  Moderate to severe malnutrition Postoperative short bowel syndrome/high output ostomy Malabsorption likely acute on chronic -Increased Lomotil to 4 times daily on  11/4 -Continue Imodium 4 mg 4 times daily -Encourage oral intake. -Optimize nutrition -Not a candidate for TPN due to fungemia   Left-sided pleural effusion/iatrogenic pneumothorax, small: S/p thoracocentesis on 10/27.  Fluid is exudative by LDH.  Fluid culture NGTD.  Subsequent chest x-ray on 10/28 without pneumothorax.  Cytology negative. 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  Previous provider discussed with Dr. Cardell Peach with urology who recommended outpatient evaluation with cystoscopy  Acute pulmonary emboli/right upper extremity DVT -Continue Lovenox   Thrombocytosis: Platelet about 1200k. Evaluated by hematology and felt to be inflammatory response.  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   -Monitor intermittently -Outpatient follow-up with hematology   Longstanding Crohn's disease: Not chronically on steroids so less concern for adrenal insufficiency. S/p colectomy -Steroids/Skyrizi held in the setting of infection.   Sinus tachycardia: Likely due to acute illness and possible dehydration.  TSH normal.  He had some pericardial effusion without tamponade.  Repeat CT on 10/25 did not reveal pericardial effusion.  Improved. -Increased metoprolol to 62.5 mg twice daily on 11/3.  Pericardial effusion without tamponade: Initially seen on TTE.  Was not seen on CTA on 10/25  -May need outpatient echocardiogram to confirm resolution.  Cardiac arrest secondary to septic shock requiring CPR on admission   Controlled DM-2 with hyperglycemia: A1c 5.8% on 9/26.  CBG consistently in low 100s.  -Discontinued SSI and CBG monitoring   History of seizures -Continue PTA keppra/gabapentin   History of anxiety -Resume home Xanax, Cymbalta, hydroxyzine   History of hypertension -Metoprolol as above   History of COPD: Stable -Continue Yupelri daily and xopenex PRN   GERD -Continue PTA PPI  Normocytic  Anemia: Stable -Transfuse for Hb <7 or hemodynamically significant bleeding  Hypokalemia/hypomagnesemia -Monitor replenish as appropriate   Inadequate oral intake/weight loss: Lost about 30 pounds so far. Body mass index is 23.28 kg/m. Nutrition Problem: Inadequate oral intake Etiology: inability to eat Signs/Symptoms: NPO status Interventions: Refer to RD note for recommendations   DVT prophylaxis:  SCDs Start: 01/27/23 1111  Code Status: Full code Family Communication: None at bedside Level of care: Telemetry Cardiac Status is: Inpatient Remains inpatient appropriate because: Abdominal pain, tachycardia, high ostomy output   Final disposition: CIR Consultants:  Pulmonology General Surgery Hematology Urology Infectious disease Ophthalmology   35 minutes with more than 50% spent in reviewing records, counseling patient/family and coordinating care.   Sch Meds:  Scheduled Meds:  acetaminophen  650 mg Oral Q6H WA   amitriptyline  25 mg Oral QHS   Chlorhexidine Gluconate Cloth  6 each Topical Daily   diphenoxylate-atropine  1 tablet Oral QID   DULoxetine  30 mg Oral Daily   enoxaparin (LOVENOX) injection  60 mg Subcutaneous Q12H   feeding supplement  237 mL Oral TID BM   fentaNYL  1 patch Transdermal Q72H   folic acid  1 mg Oral Daily   gabapentin  100 mg Oral TID   Gerhardt's butt cream   Topical BID   levETIRAcetam  750 mg Oral BID   lidocaine  2 patch Transdermal Q24H   lidocaine (PF)  8 mL Intradermal Once   loperamide  4 mg Oral QID   loratadine  10 mg Oral Daily   methocarbamol  1,000 mg Oral TID   metoprolol tartrate  62.5 mg Oral BID   multivitamin with minerals  1 tablet Oral Daily   pantoprazole  40 mg Oral Daily   revefenacin  175 mcg Nebulization Daily   thiamine  100 mg Oral Daily   vancomycin  125 mg Oral BID   Followed by   Melene Muller ON 03/13/2023] vancomycin  125 mg Oral Daily   Followed by   Melene Muller ON 03/20/2023] vancomycin  125 mg Oral  QODAY   Followed by   Melene Muller ON 03/28/2023] vancomycin  125 mg Oral Q3 days   Continuous Infusions:  micafungin (MYCAMINE) 150 mg in sodium chloride 0.9 % 100 mL IVPB 150 mg (03/08/23 0827)   PRN Meds:.ALPRAZolam, bismuth subsalicylate, camphor-menthol, dextrose, HYDROmorphone (DILAUDID) injection, HYDROmorphone, ibuprofen, levalbuterol, magic mouthwash, metoprolol tartrate, ondansetron (ZOFRAN) IV, mouth rinse, prochlorperazine  Antimicrobials: Anti-infectives (From admission, onward)    Start     Dose/Rate Route Frequency Ordered Stop   03/28/23 1000  vancomycin (VANCOCIN) capsule 125 mg       Placed in "Followed by" Linked Group   125 mg Oral Every 3 DAYS 03/01/23 0750 04/06/23 0959   03/20/23 1000  vancomycin (VANCOCIN) capsule 125 mg       Placed in "Followed by" Linked Group   125 mg Oral Every other day 03/01/23 0750 03/28/23 0959   03/13/23 1000  vancomycin (VANCOCIN) capsule 125 mg       Placed in "Followed by" Linked Group   125 mg Oral Daily 03/01/23 0750 03/20/23 0959   03/06/23 1000  vancomycin (VANCOCIN) capsule 125 mg       Placed in "Followed by" Linked Group   125 mg Oral 2 times daily 03/01/23 0750 03/13/23 0959   03/01/23 1000  vancomycin (VANCOCIN) capsule 125 mg       Placed in "Followed by" Linked Group   125 mg Oral 4  times daily 03/01/23 0750 03/05/23 2134   02/18/23 1800  vancomycin (VANCOCIN) 50 mg/mL oral solution SOLN 125 mg        125 mg Oral 4 times daily 02/18/23 1439 02/26/23 0959   02/18/23 0400  micafungin (MYCAMINE) 150 mg in sodium chloride 0.9 % 100 mL IVPB        150 mg 107.5 mL/hr over 1 Hours Intravenous Every 24 hours 02/17/23 0936     02/17/23 0900  vancomycin (VANCOREADY) IVPB 750 mg/150 mL  Status:  Discontinued        750 mg 150 mL/hr over 60 Minutes Intravenous Every 8 hours 02/17/23 0204 02/17/23 0947   02/17/23 0400  micafungin (MYCAMINE) 100 mg in sodium chloride 0.9 % 100 mL IVPB  Status:  Discontinued        100 mg 105 mL/hr  over 1 Hours Intravenous Every 24 hours 02/16/23 0353 02/17/23 0936   02/16/23 1045  vancomycin (VANCOCIN) 50 mg/mL oral solution SOLN 125 mg  Status:  Discontinued        125 mg Per Tube 4 times daily 02/16/23 0953 02/18/23 1439   02/16/23 0415  micafungin (MYCAMINE) 100 mg in sodium chloride 0.9 % 100 mL IVPB        100 mg 105 mL/hr over 1 Hours Intravenous STAT 02/16/23 0353 02/16/23 0610   02/15/23 1300  vancomycin (VANCOREADY) IVPB 750 mg/150 mL  Status:  Discontinued        750 mg 150 mL/hr over 60 Minutes Intravenous Every 12 hours 02/14/23 1156 02/17/23 0204   02/14/23 1130  vancomycin (VANCOCIN) IVPB 1000 mg/200 mL premix        1,000 mg 200 mL/hr over 60 Minutes Intravenous  Once 02/14/23 1043 02/14/23 1249   02/11/23 2000  cefTRIAXone (ROCEPHIN) 2 g in sodium chloride 0.9 % 100 mL IVPB  Status:  Discontinued       Note to Pharmacy: Pharmacy may adjust dosing strength / duration / interval for maximal efficacy   2 g 200 mL/hr over 30 Minutes Intravenous Every 24 hours 02/11/23 1622 02/28/23 0852   02/10/23 1600  metroNIDAZOLE (FLAGYL) IVPB 500 mg  Status:  Discontinued        500 mg 100 mL/hr over 60 Minutes Intravenous Every 12 hours 02/10/23 1444 02/28/23 0857   02/08/23 1800  vancomycin (VANCOCIN) 50 mg/mL oral solution SOLN 500 mg        500 mg Per Tube Every 6 hours 02/08/23 1325 02/12/23 1759   02/03/23 2200  metroNIDAZOLE (FLAGYL) tablet 500 mg        500 mg Per Tube Every 12 hours 02/03/23 1406 02/06/23 2148   02/02/23 1815  vancomycin (VANCOCIN) 50 mg/mL oral solution SOLN 125 mg  Status:  Discontinued        125 mg Per Tube Every 6 hours 02/02/23 1717 02/08/23 1325   02/02/23 1545  vancomycin (VANCOCIN) capsule 125 mg  Status:  Discontinued        125 mg Oral 4 times daily 02/02/23 1530 02/02/23 1717   01/27/23 1430  cefTRIAXone (ROCEPHIN) 2 g in sodium chloride 0.9 % 100 mL IVPB        2 g 200 mL/hr over 30 Minutes Intravenous Every 24 hours 01/27/23 1340 02/05/23  1403   01/27/23 1400  metroNIDAZOLE (FLAGYL) IVPB 500 mg  Status:  Discontinued        500 mg 100 mL/hr over 60 Minutes Intravenous Every 12 hours 01/27/23 1340 02/03/23 1406  01/27/23 0545  vancomycin (VANCOREADY) IVPB 1250 mg/250 mL        1,250 mg 166.7 mL/hr over 90 Minutes Intravenous  Once 01/27/23 0542 01/27/23 0809   01/27/23 0545  ceFEPIme (MAXIPIME) 2 g in sodium chloride 0.9 % 100 mL IVPB        2 g 200 mL/hr over 30 Minutes Intravenous  Once 01/27/23 0542 01/27/23 0621   01/27/23 0545  metroNIDAZOLE (FLAGYL) IVPB 500 mg        500 mg 100 mL/hr over 60 Minutes Intravenous  Once 01/27/23 0542 01/27/23 0741        I have personally reviewed the following labs and images: CBC: Recent Labs  Lab 03/02/23 0001 03/03/23 0455 03/04/23 0530 03/05/23 0556 03/07/23 0436  WBC 12.0* 11.1* 12.4* 10.5 9.8  NEUTROABS  --  6.8  --   --   --   HGB 10.1* 10.5* 11.1* 11.3* 10.7*  HCT 31.8* 32.9* 34.6* 35.4* 34.4*  MCV 85.5 86.6 86.1 85.3 86.4  PLT 969* 1,082* 1,268* 1,212* 1,210*   BMP &GFR Recent Labs  Lab 03/02/23 0001 03/03/23 0455 03/04/23 0530 03/05/23 0556 03/07/23 0436  NA 131* 131* 131* 130* 131*  K 4.2 3.9 4.1 3.6 4.7  CL 94* 93* 94* 90* 93*  CO2 26 26 26 24 27   GLUCOSE 108* 89 106* 113* 87  BUN 7 6 <5* 6 6  CREATININE 0.61 0.57 0.53 0.54 0.57  CALCIUM 9.6 9.8 9.7 10.1 10.2  MG  --  1.5* 1.9 1.6* 1.7  PHOS  --   --   --  4.3 4.6   Estimated Creatinine Clearance: 67.3 mL/min (by C-G formula based on SCr of 0.57 mg/dL). Liver & Pancreas: Recent Labs  Lab 03/02/23 0001 03/03/23 0455 03/05/23 0556 03/07/23 0436  AST 15 19  --   --   ALT 8 9  --   --   ALKPHOS 151* 161*  --   --   BILITOT 0.5 0.3  --   --   PROT 7.0 6.8  --   --   ALBUMIN 2.6* 2.7* 3.0* 3.0*   No results for input(s): "LIPASE", "AMYLASE" in the last 168 hours. No results for input(s): "AMMONIA" in the last 168 hours. Diabetic: No results for input(s): "HGBA1C" in the last 72  hours. Recent Labs  Lab 03/07/23 0822 03/07/23 1228 03/07/23 1640 03/07/23 2047 03/08/23 0556  GLUCAP 108* 110* 102* 102* 102*   Cardiac Enzymes: No results for input(s): "CKTOTAL", "CKMB", "CKMBINDEX", "TROPONINI" in the last 168 hours. No results for input(s): "PROBNP" in the last 8760 hours. Coagulation Profile: No results for input(s): "INR", "PROTIME" in the last 168 hours. Thyroid Function Tests: No results for input(s): "TSH", "T4TOTAL", "FREET4", "T3FREE", "THYROIDAB" in the last 72 hours. Lipid Profile: No results for input(s): "CHOL", "HDL", "LDLCALC", "TRIG", "CHOLHDL", "LDLDIRECT" in the last 72 hours. Anemia Panel: No results for input(s): "VITAMINB12", "FOLATE", "FERRITIN", "TIBC", "IRON", "RETICCTPCT" in the last 72 hours. Urine analysis:    Component Value Date/Time   COLORURINE AMBER (A) 01/27/2023 0514   APPEARANCEUR HAZY (A) 01/27/2023 0514   LABSPEC 1.020 01/27/2023 0514   PHURINE 5.0 01/27/2023 0514   GLUCOSEU NEGATIVE 01/27/2023 0514   GLUCOSEU NEG mg/dL 40/98/1191 4782   HGBUR NEGATIVE 01/27/2023 0514   BILIRUBINUR NEGATIVE 01/27/2023 0514   KETONESUR 5 (A) 01/27/2023 0514   PROTEINUR 30 (A) 01/27/2023 0514   UROBILINOGEN 0.2 12/26/2013 1530   NITRITE NEGATIVE 01/27/2023 0514   LEUKOCYTESUR TRACE (A)  01/27/2023 0514   Sepsis Labs: Invalid input(s): "PROCALCITONIN", "LACTICIDVEN"  Microbiology: Recent Results (from the past 240 hour(s))  Culture, body fluid w Gram Stain-bottle     Status: None   Collection Time: 02/27/23  1:00 PM   Specimen: Pleura  Result Value Ref Range Status   Specimen Description PLEURAL  Final   Special Requests LEFT  Final   Culture   Final    NO GROWTH 5 DAYS Performed at [facility], [address], [place], [place] [address]    Report Status 03/04/2023 FINAL  Final  Gram stain     Status: None   Collection Time: 02/27/23  1:00 PM   Specimen: Pleura  Result Value Ref Range Status   Specimen Description  PLEURAL  Final   Special Requests LEFT  Final   Gram Stain   Final    FEW WBC SEEN NO ORGANISMS SEEN Performed at [facility], [address], [place], [place] [address]    Report Status 02/27/2023 FINAL  Final    Radiology Studies: No results found.    [name] T. [name] [facility]  If 7PM-7AM, please contact night-coverage [email or web address] 03/08/2023, 1:53 PM

## 2023-03-08 NOTE — Progress Notes (Signed)
OT Cancellation Note  Patient Details Name: [name] MRN: [id number] DOB: 11/23/1970   Cancelled Treatment:    Reason Eval/Treat Not Completed: Patient declined, no reason specified;Pain limiting ability to participate (Pt with 10/10 pain and asking OT to return tomorrow.)  Tyler Deis, OTR/L [facility] Acute Rehabilitation Office: [phone]   [name] 03/08/2023, 5:16 PM

## 2023-03-08 NOTE — Progress Notes (Signed)
Nutrition Follow-up  DOCUMENTATION CODES:   Not applicable  INTERVENTION:  Continue to encourage adequate PO intake Double protein portions with all meals Continue Ensure Plus High Protein po TID, each supplement provides 350 kcal and 20 grams of protein. MVI with minerals daily Encourage family to provide food from outside hospital to optimize nutritional intake  NUTRITION DIAGNOSIS:   Inadequate oral intake related to inability to eat as evidenced by NPO status. - no longer NPO; PO intake remains inadequate  GOAL:   Patient will meet greater than or equal to 90% of their needs - slowly progressing  MONITOR:   Diet advancement, Skin, Labs, Weight trends, TF tolerance  REASON FOR ASSESSMENT:   Consult Assessment of nutrition requirement/status  ASSESSMENT:   52 year old female with past medical history of COPD, Crohn's on Skyrizi, hypertension, type 2 diabetes mellitus who initially presented to the after being found unresponsive.  10/11: Total abdominal colectomy with end ileostomy; frank necrosis of proximal rectum and sigmoid colon with significant colitis involving remainder of perforation of proximal rectum and large pelvic abscess  10/27: s/p thoracentesis- yielfd ; developed L pneumothorax   Pt with WOC RN at time of visit.    Per MD note, tachycardia and pain control remain a challenge. Pt with ongoing nausea despite zofran, MD adding compazine.   Review of MAR reflects pt continues to intermittently refuse Ensure supplements. Drinking about 1 on average daily.   Unfortunately, there remains limited documentation of meals consumed however it appears pt's intake has been increasing since prior follow up. Previously she was consuming ~0-25% of the meals documented.   Per review of meal order system, she has also added a few more food items into her diet includes chicken noodle soup and french fries for breakfast. Continues with pot roast, mashed potatoes for  lunch in addition to mac and cheese or dinner roll at times.   Meal completions: 10/31: 50% lunch 11/1: 75% lunch 11/2: 50% breakfast, 100% lunch, 25% dinner  Admit weight: 68.9 kg Current weight: 59.6 kg  Medications: lomotil QID, folvite, imodium QID, MVI, protonix, thiamine, IV abx, IV micafungin  Labs: sodium 131, CBG's 102-110 x24 hours  LLQ drain: 25ml x24 hours  Diet Order:   Diet Order             Diet regular Room service appropriate? Yes; Fluid consistency: Thin  Diet effective now                   EDUCATION NEEDS:   Not appropriate for education at this time  Skin:  Skin Assessment: Skin Integrity Issues: Skin Integrity Issues:: Stage II, Wound VAC, Other (Comment) Stage II: lip (ETT holder) Wound Vac: abdomen (closed surgical incision) Other: MASD to perineum  Last BM:  x24 hours via ileostomy  Height:   Ht Readings from Last 1 Encounters:  02/02/23 5' 2.99" (1.6 m)    Weight:   Wt Readings from Last 1 Encounters:  03/08/23 59.6 kg    Ideal Body Weight:  52.3 kg  BMI:  Body mass index is 23.28 kg/m.  Estimated Nutritional Needs:   Kcal:  1700-1900 kcal/d  Protein:  85-100 g/d  Fluid:  >1.8L/d  [name], RDN, LDN Clinical Nutrition

## 2023-03-08 NOTE — Consult Note (Signed)
WOC Nurse ostomy follow up Stoma type/location: RLQ, end ileostomy  Stomal assessment/size: 1" round, skin level; pink Peristomal assessment: hypergranulation tissue noted at 9 o'clock  Treatment options for stomal/peristomal skin: 2" ostomy barrier ring  Output liquid; 300CC in pouch when I arrived  Ostomy pouching: 1pc.convex with 2" barrier ring Education provided:  Patient has been reluctant to even look at stoma Today we discussed the care needed for her ostomy at home.  I explained to her that she will need to be able to care for herself at home and that we needed at least a starting point for her to learn a few things.  She was finally in agreement to have pouch changed today when I pointed out it was last changed last week.   Explained stoma characteristics (budded, flush, color, texture, care) Demonstrated pouch change (cutting new skin barrier, cleaning peristomal skin and stoma, use of barrier ring) Education on emptying when 1/3 to 1/2 full and how to empty, when I arrived pouch was literally about to explode with stool and gas, requested patient to look at pouch to see when the pouch is overly full and that at home she will need to empty more often.  She covers her mouth and nose when I removed the pouch and she visualized stoma.  She did engage slightly as I placed barrier ring, explained peristomal skin care, and placed new pouch. Requested towel be placed back over/around pouch.  Enrolled patient in [place] Secure Start Discharge program: Yes  Discussed case with TOC, possible SNF at DC.   [facility] Nurse will follow along with you for continued support with ostomy teaching and care Tanyia Grabbe [facility] MSN, RN, [place], CNS, [place] [phone]

## 2023-03-08 NOTE — Progress Notes (Signed)
Mobility Specialist Progress Note:    03/08/23 1502  Mobility  Activity Refused mobility   Pt refused mobility stating "I feel terrible". Will f/u as able.    [name]  Mobility Specialist Please contact via [employer] office at [phone]

## 2023-03-09 DIAGNOSIS — I469 Cardiac arrest, cause unspecified: Secondary | ICD-10-CM | POA: Diagnosis not present

## 2023-03-09 DIAGNOSIS — B49 Unspecified mycosis: Secondary | ICD-10-CM | POA: Diagnosis not present

## 2023-03-09 DIAGNOSIS — D62 Acute posthemorrhagic anemia: Secondary | ICD-10-CM | POA: Diagnosis not present

## 2023-03-09 DIAGNOSIS — A0472 Enterocolitis due to Clostridium difficile, not specified as recurrent: Secondary | ICD-10-CM | POA: Diagnosis not present

## 2023-03-09 MED ORDER — HYDROMORPHONE HCL 1 MG/ML IJ SOLN
0.5000 mg | Freq: Four times a day (QID) | INTRAMUSCULAR | Status: DC | PRN
  Administered 2023-03-09 – 2023-03-10 (×2): 0.5 mg via INTRAVENOUS
  Filled 2023-03-09 (×2): qty 0.5

## 2023-03-09 MED ORDER — IVABRADINE HCL 5 MG PO TABS
5.0000 mg | ORAL_TABLET | Freq: Two times a day (BID) | ORAL | Status: DC
  Administered 2023-03-09 – 2023-03-19 (×21): 5 mg via ORAL
  Filled 2023-03-09 (×22): qty 1

## 2023-03-09 MED ORDER — SODIUM CHLORIDE 0.9% FLUSH
10.0000 mL | Freq: Two times a day (BID) | INTRAVENOUS | Status: DC
  Administered 2023-03-09 – 2023-03-14 (×12): 10 mL
  Administered 2023-03-15: 20 mL
  Administered 2023-03-15 – 2023-03-18 (×7): 10 mL

## 2023-03-09 MED ORDER — SODIUM CHLORIDE 0.9% FLUSH
10.0000 mL | INTRAVENOUS | Status: DC | PRN
  Administered 2023-03-19 (×2): 10 mL

## 2023-03-09 MED ORDER — HYDROMORPHONE HCL 2 MG PO TABS
4.0000 mg | ORAL_TABLET | ORAL | Status: DC | PRN
  Administered 2023-03-09 – 2023-03-10 (×5): 4 mg via ORAL
  Filled 2023-03-09 (×5): qty 2

## 2023-03-09 NOTE — Progress Notes (Signed)
PROGRESS NOTE  [name] XBM:[id number] DOB: 1970-05-27   PCP: [name], PA-C  Patient is from: Home  DOA: 01/27/2023 LOS: 41  Chief complaints Chief Complaint  Patient presents with   Respiratory Arrest     Brief Narrative / Interim history: 52 year old female with past medical history of COPD, Crohn's on Skyrizi, hypertension, type 2 diabetes mellitus who initially presented to the after being found unresponsive. Per family, patient has had bloody emesis with diarrhea over the past few days. Initial systolics in the 60s in the ED, and patient was minimally responsive. In the emergency room, patient was intubated and admitted to the ICU for septic shock and encephalopathy on 01/27/2023.  For further evaluation and management for shock and altered mental status.   Further workup revealed C. difficile colitis, intra-abdominal infection with abscess and fungemia.  ID consulted and started on prolonged p.o. vancomycin with taper and micafungin in addition to IV antibiotics for intra-abdominal abscess.  Eventually, she came off vasopressors on 9/29 and extubated on 10/2 and transferred to floor on 10/3.   Patient with ongoing diarrhea and had repeat proctosigmoidoscopy that showed severe inflammation likely from underlying Crohn's disease, active C. difficile infection and possible ischemic colitis from prior hypotension on presentation.  She underwent ex lap with total total colectomy and ileostomy on 10/11.  Intraoperatively, found to have frank necrosis with pelvic abscess formation and significant colitis. Unfortunately, she became tachycardic with hypotension and lethargy and transferred back to ICU on 10/13.  Blood culture on 10/15 grew Candida glabrata for which she was started on micafungin.  CT abdomen and pelvis on 10/16 concerning for incidental small PE to RLL pulmonary artery branch raising concern for septic emboli.  She underwent TEE on 10/18 that was negative for  endocarditis.  Patient completed course for antibiotics and p.o. vancomycin.  Remains on micafungin for fungemia for a total of 6 weeks from 10/16.  ID following intermittently.  In regards to abdominal wound, wound VAC removed.  Surgery recommended wet-to-dry dressing.  Surgery following weekly.  Therapy recommended CIR but turned it down by CIR due lack of 24/7 supervision and need for prolonged rehabilitation.  Plan is discharge to SNF once tachycardia and diarrhea fairly controlled.  We have been titrating metoprolol.  Added ivabradine on 11/6.  Weaning of IV pain medication.    Subjective: Seen and examined earlier this morning.  No major events overnight of this morning.  Reports improvement in her pain.  Tachycardic to 120s.  About 800 cc stool output in the last 24 hours.  Objective: Vitals:   03/09/23 0744 03/09/23 0801 03/09/23 0900 03/09/23 1100  BP:  (!) 128/54 108/65 105/76  Pulse:  (!) 131  (!) 155  Resp:  13 14   Temp:  97.6 F (36.4 C)    TempSrc:  Oral    SpO2: 100% 96%    Weight:      Height:        Examination:  GENERAL: No apparent distress.  Nontoxic. HEENT: MMM.  Vision and hearing grossly intact.  No tenderness with palpation over temporal regions. NECK: Supple.  No apparent JVD.  RESP:  No IWOB.  Fair aeration bilaterally. CVS: HR in 120s.  Regular rhythm.  Heart sounds normal.  ABD/GI/GU: BS+.  Laparotomy wound.  No signs of infection.  Ileostomy bag with liquid stool.  JP drain to LLQ. MSK/EXT:  Moves extremities. No apparent deformity. No edema.  SKIN: no apparent skin lesion or wound NEURO: Awake, alert  and oriented appropriately.  No apparent focal neuro deficit. PSYCH: Calm. Normal affect.    Microbiology summarized: 9/26-COVID-19, influenza and RSV PCR nonreactive 9/26 and 10/13-MRSA PCR screen negative 9/26-blood culture with staph hominis in 1 out of 2 bottles likely contaminant 9/29-C. difficile PCR positive 9/29-GIP negative 10/12-blood  culture with Candida glabrata in 1 out of 2 bottles 10/17-blood cultures NGTD 10/27-pleural fluid culture NGTD  Assessment and plan: Septic shock, multifactorial-including C. difficile colitis, fungemia, intra-abdominal abscess Fungemia, candida glabrata, likely POA Cdiff Colitis: POA.  Completed treatment course. Septic emboli, thrombocytosis Intra-abdominal infection/abscess -Culture data as above. -S/p laparotomy with total colectomy with ileostomy and LLQ JP drain -Received ceftriaxone and Flagyl for extended course until 10/28. -TTE/TEE negative for vegetation.  Bubble study negative. -Appreciate input by ID -Continue antifungal for 6 weeks for fungemia.  Started on 10/16>>>. -Complete 14 days of p.o. vancomycin followed by taper for C. Difficile-completed. -Ophthalmology or neuro consultation.  -Evaluated by ophthalmology on 10/23.  No evidence of fungal infiltrate on funduscopic evaluation -Left subclavian tunneled CVC placed 02/21/2023 with IR -Leukocytosis resolved.  Still with diarrhea probably due to colectomy. -Wound care per general surgery and wound care staff.  WTD daily -Continue fentanyl patch.  Increase p.o. Dilaudid to 4 mg every 4 hours as needed moderate pain -Decreased IV Dilaudid to 0.5 mg every 6 hours as needed severe pain -Patient has been encouraged to minimize IV Dilaudid.   Intractable abdominal pain: Likely due to the above. Left-sided chest pain: EKG and troponin negative.  Chest pain resolved.  Left-sided headache-Attributing to oxycodone.  Oxycodone stopped.  Resolved. Nausea without emesis -Pain regimen as above. -Continue home amitriptyline, gabapentin and Cymbalta -Add Compazine for refractory nausea and vomiting  Moderate to severe malnutrition Postoperative short bowel syndrome/high output ostomy Malabsorption likely acute on chronic -Increased Lomotil to 4 times daily on 11/4 -Continue Imodium 4 mg 4 times daily -Encourage oral  intake. -Optimize nutrition -Not a candidate for TPN due to fungemia  Sinus tachycardia: Likely due to acute illness and possible dehydration.  TSH normal.  He had some pericardial effusion without tamponade.  Repeat CT on 10/25 did not reveal pericardial effusion. -Increased metoprolol to 62.5 mg twice daily on 11/3. -Added ivabradine 5 mg twice daily on 11/6 -May consider repeat echocardiogram if no improvement   Left-sided pleural effusion/iatrogenic pneumothorax, small: S/p thoracocentesis on 10/27.  Fluid is exudative by LDH.  Fluid culture NGTD.  Subsequent chest x-ray on 10/28 without pneumothorax.  Cytology negative. 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  Previous provider discussed with Dr. Cardell Peach with urology who recommended outpatient evaluation with cystoscopy  Acute pulmonary emboli/right upper extremity DVT -Continue Lovenox   Thrombocytosis: Platelet about 1200k. Evaluated by hematology and felt to be inflammatory response.  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   -Monitor intermittently -Outpatient follow-up with hematology   Longstanding Crohn's disease: Not chronically on steroids so less concern for adrenal insufficiency. S/p colectomy -Steroids/Skyrizi held in the setting of infection.  Pericardial effusion without tamponade: Initially seen on TTE.  Was not seen on CTA on 10/25  -May need outpatient echocardiogram to confirm resolution.  Cardiac arrest secondary to septic shock requiring CPR on admission   Controlled DM-2 with hyperglycemia: A1c 5.8% on 9/26.  CBG consistently in low 100s.  -Discontinued SSI and CBG monitoring   History of seizures -Continue PTA keppra/gabapentin   History of anxiety -Resume home Xanax, Cymbalta, hydroxyzine  History of hypertension -Metoprolol as above   History of COPD: Stable -Continue Yupelri daily and xopenex PRN   GERD -Continue  PTA PPI   Normocytic Anemia: Stable -Transfuse for Hb <7 or hemodynamically significant bleeding  Hypokalemia/hypomagnesemia -Monitor replenish as appropriate   Inadequate oral intake/weight loss: Lost about 30 pounds so far. Body mass index is 23.28 kg/m. Nutrition Problem: Inadequate oral intake Etiology: inability to eat Signs/Symptoms: NPO status Interventions: Refer to RD note for recommendations   DVT prophylaxis:  SCDs Start: 01/27/23 1111  Code Status: Full code Family Communication: None at bedside Level of care: Telemetry Cardiac Status is: Inpatient Remains inpatient appropriate because: Abdominal pain, tachycardia, high ostomy output   Final disposition: SNF Consultants:  Pulmonology General Surgery Hematology Urology Infectious disease Ophthalmology   35 minutes with more than 50% spent in reviewing records, counseling patient/family and coordinating care.   Sch Meds:  Scheduled Meds:  acetaminophen  650 mg Oral Q6H WA   amitriptyline  25 mg Oral QHS   Chlorhexidine Gluconate Cloth  6 each Topical Daily   diphenoxylate-atropine  1 tablet Oral QID   DULoxetine  30 mg Oral Daily   enoxaparin (LOVENOX) injection  60 mg Subcutaneous Q12H   feeding supplement  237 mL Oral TID BM   fentaNYL  1 patch Transdermal Q72H   folic acid  1 mg Oral Daily   gabapentin  100 mg Oral TID   Gerhardt's butt cream   Topical BID   ivabradine  5 mg Oral BID WC   levETIRAcetam  750 mg Oral BID   lidocaine  2 patch Transdermal Q24H   lidocaine (PF)  8 mL Intradermal Once   loperamide  4 mg Oral QID   loratadine  10 mg Oral Daily   methocarbamol  1,000 mg Oral TID   metoprolol tartrate  62.5 mg Oral BID   multivitamin with minerals  1 tablet Oral Daily   pantoprazole  40 mg Oral Daily   revefenacin  175 mcg Nebulization Daily   sodium chloride flush  10-40 mL Intracatheter Q12H   thiamine  100 mg Oral Daily   vancomycin  125 mg Oral BID   Followed by   Melene Muller ON  03/13/2023] vancomycin  125 mg Oral Daily   Followed by   Melene Muller ON 03/20/2023] vancomycin  125 mg Oral QODAY   Followed by   Melene Muller ON 03/28/2023] vancomycin  125 mg Oral Q3 days   Continuous Infusions:  micafungin (MYCAMINE) 150 mg in sodium chloride 0.9 % 100 mL IVPB 150 mg (03/09/23 1029)   PRN Meds:.ALPRAZolam, bismuth subsalicylate, camphor-menthol, dextrose, HYDROmorphone (DILAUDID) injection, HYDROmorphone, levalbuterol, magic mouthwash, metoprolol tartrate, ondansetron (ZOFRAN) IV, mouth rinse, prochlorperazine, sodium chloride flush  Antimicrobials: Anti-infectives (From admission, onward)    Start     Dose/Rate Route Frequency Ordered Stop   03/28/23 1000  vancomycin (VANCOCIN) capsule 125 mg       Placed in "Followed by" Linked Group   125 mg Oral Every 3 DAYS 03/01/23 0750 04/06/23 0959   03/20/23 1000  vancomycin (VANCOCIN) capsule 125 mg       Placed in "Followed by" Linked Group   125 mg Oral Every other day 03/01/23 0750 03/28/23 0959   03/13/23 1000  vancomycin (VANCOCIN) capsule 125 mg       Placed in "Followed by" Linked Group   125 mg Oral Daily 03/01/23 0750 03/20/23 0959   03/06/23 1000  vancomycin (VANCOCIN) capsule 125 mg  Placed in "Followed by" Linked Group   125 mg Oral 2 times daily 03/01/23 0750 03/13/23 0959   03/01/23 1000  vancomycin (VANCOCIN) capsule 125 mg       Placed in "Followed by" Linked Group   125 mg Oral 4 times daily 03/01/23 0750 03/05/23 2134   02/18/23 1800  vancomycin (VANCOCIN) 50 mg/mL oral solution SOLN 125 mg        125 mg Oral 4 times daily 02/18/23 1439 02/26/23 0959   02/18/23 0400  micafungin (MYCAMINE) 150 mg in sodium chloride 0.9 % 100 mL IVPB        150 mg 107.5 mL/hr over 1 Hours Intravenous Every 24 hours 02/17/23 0936     02/17/23 0900  vancomycin (VANCOREADY) IVPB 750 mg/150 mL  Status:  Discontinued        750 mg 150 mL/hr over 60 Minutes Intravenous Every 8 hours 02/17/23 0204 02/17/23 0947   02/17/23 0400   micafungin (MYCAMINE) 100 mg in sodium chloride 0.9 % 100 mL IVPB  Status:  Discontinued        100 mg 105 mL/hr over 1 Hours Intravenous Every 24 hours 02/16/23 0353 02/17/23 0936   02/16/23 1045  vancomycin (VANCOCIN) 50 mg/mL oral solution SOLN 125 mg  Status:  Discontinued        125 mg Per Tube 4 times daily 02/16/23 0953 02/18/23 1439   02/16/23 0415  micafungin (MYCAMINE) 100 mg in sodium chloride 0.9 % 100 mL IVPB        100 mg 105 mL/hr over 1 Hours Intravenous STAT 02/16/23 0353 02/16/23 0610   02/15/23 1300  vancomycin (VANCOREADY) IVPB 750 mg/150 mL  Status:  Discontinued        750 mg 150 mL/hr over 60 Minutes Intravenous Every 12 hours 02/14/23 1156 02/17/23 0204   02/14/23 1130  vancomycin (VANCOCIN) IVPB 1000 mg/200 mL premix        1,000 mg 200 mL/hr over 60 Minutes Intravenous  Once 02/14/23 1043 02/14/23 1249   02/11/23 2000  cefTRIAXone (ROCEPHIN) 2 g in sodium chloride 0.9 % 100 mL IVPB  Status:  Discontinued       Note to Pharmacy: Pharmacy may adjust dosing strength / duration / interval for maximal efficacy   2 g 200 mL/hr over 30 Minutes Intravenous Every 24 hours 02/11/23 1622 02/28/23 0852   02/10/23 1600  metroNIDAZOLE (FLAGYL) IVPB 500 mg  Status:  Discontinued        500 mg 100 mL/hr over 60 Minutes Intravenous Every 12 hours 02/10/23 1444 02/28/23 0857   02/08/23 1800  vancomycin (VANCOCIN) 50 mg/mL oral solution SOLN 500 mg        500 mg Per Tube Every 6 hours 02/08/23 1325 02/12/23 1759   02/03/23 2200  metroNIDAZOLE (FLAGYL) tablet 500 mg        500 mg Per Tube Every 12 hours 02/03/23 1406 02/06/23 2148   02/02/23 1815  vancomycin (VANCOCIN) 50 mg/mL oral solution SOLN 125 mg  Status:  Discontinued        125 mg Per Tube Every 6 hours 02/02/23 1717 02/08/23 1325   02/02/23 1545  vancomycin (VANCOCIN) capsule 125 mg  Status:  Discontinued        125 mg Oral 4 times daily 02/02/23 1530 02/02/23 1717   01/27/23 1430  cefTRIAXone (ROCEPHIN) 2 g in sodium  chloride 0.9 % 100 mL IVPB        2 g 200 mL/hr over 30 Minutes  Intravenous Every 24 hours 01/27/23 1340 02/05/23 1403   01/27/23 1400  metroNIDAZOLE (FLAGYL) IVPB 500 mg  Status:  Discontinued        500 mg 100 mL/hr over 60 Minutes Intravenous Every 12 hours 01/27/23 1340 02/03/23 1406   01/27/23 0545  vancomycin (VANCOREADY) IVPB 1250 mg/250 mL        1,250 mg 166.7 mL/hr over 90 Minutes Intravenous  Once 01/27/23 0542 01/27/23 0809   01/27/23 0545  ceFEPIme (MAXIPIME) 2 g in sodium chloride 0.9 % 100 mL IVPB        2 g 200 mL/hr over 30 Minutes Intravenous  Once 01/27/23 0542 01/27/23 0621   01/27/23 0545  metroNIDAZOLE (FLAGYL) IVPB 500 mg        500 mg 100 mL/hr over 60 Minutes Intravenous  Once 01/27/23 0542 01/27/23 0741        I have personally reviewed the following labs and images: CBC: Recent Labs  Lab 03/03/23 0455 03/04/23 0530 03/05/23 0556 03/07/23 0436  WBC 11.1* 12.4* 10.5 9.8  NEUTROABS 6.8  --   --   --   HGB 10.5* 11.1* 11.3* 10.7*  HCT 32.9* 34.6* 35.4* 34.4*  MCV 86.6 86.1 85.3 86.4  PLT 1,082* 1,268* 1,212* 1,210*   BMP &GFR Recent Labs  Lab 03/03/23 0455 03/04/23 0530 03/05/23 0556 03/07/23 0436  NA 131* 131* 130* 131*  K 3.9 4.1 3.6 4.7  CL 93* 94* 90* 93*  CO2 26 26 24 27   GLUCOSE 89 106* 113* 87  BUN 6 <5* 6 6  CREATININE 0.57 0.53 0.54 0.57  CALCIUM 9.8 9.7 10.1 10.2  MG 1.5* 1.9 1.6* 1.7  PHOS  --   --  4.3 4.6   Estimated Creatinine Clearance: 67.3 mL/min (by C-G formula based on SCr of 0.57 mg/dL). Liver & Pancreas: Recent Labs  Lab 03/03/23 0455 03/05/23 0556 03/07/23 0436  AST 19  --   --   ALT 9  --   --   ALKPHOS 161*  --   --   BILITOT 0.3  --   --   PROT 6.8  --   --   ALBUMIN 2.7* 3.0* 3.0*   No results for input(s): "LIPASE", "AMYLASE" in the last 168 hours. No results for input(s): "AMMONIA" in the last 168 hours. Diabetic: No results for input(s): "HGBA1C" in the last 72 hours. Recent Labs  Lab  03/07/23 0822 03/07/23 1228 03/07/23 1640 03/07/23 2047 03/08/23 0556  GLUCAP 108* 110* 102* 102* 102*   Cardiac Enzymes: No results for input(s): "CKTOTAL", "CKMB", "CKMBINDEX", "TROPONINI" in the last 168 hours. No results for input(s): "PROBNP" in the last 8760 hours. Coagulation Profile: No results for input(s): "INR", "PROTIME" in the last 168 hours. Thyroid Function Tests: No results for input(s): "TSH", "T4TOTAL", "FREET4", "T3FREE", "THYROIDAB" in the last 72 hours. Lipid Profile: No results for input(s): "CHOL", "HDL", "LDLCALC", "TRIG", "CHOLHDL", "LDLDIRECT" in the last 72 hours. Anemia Panel: No results for input(s): "VITAMINB12", "FOLATE", "FERRITIN", "TIBC", "IRON", "RETICCTPCT" in the last 72 hours. Urine analysis:    Component Value Date/Time   COLORURINE AMBER (A) 01/27/2023 0514   APPEARANCEUR HAZY (A) 01/27/2023 0514   LABSPEC 1.020 01/27/2023 0514   PHURINE 5.0 01/27/2023 0514   GLUCOSEU NEGATIVE 01/27/2023 0514   GLUCOSEU NEG mg/dL 66/10/3014 0109   HGBUR NEGATIVE 01/27/2023 0514   BILIRUBINUR NEGATIVE 01/27/2023 0514   KETONESUR 5 (A) 01/27/2023 0514   PROTEINUR 30 (A) 01/27/2023 0514   UROBILINOGEN 0.2 12/26/2013  1530   NITRITE NEGATIVE 01/27/2023 0514   LEUKOCYTESUR TRACE (A) 01/27/2023 0514   Sepsis Labs: Invalid input(s): "PROCALCITONIN", "LACTICIDVEN"  Microbiology: No results found for this or any previous visit (from the past 240 hour(s)).   Radiology Studies: No results found.    Derril Franek T. Dvontae Ruan [facility]  If 7PM-7AM, please contact night-coverage [email or web address] 03/09/2023, 1:46 PM

## 2023-03-09 NOTE — Progress Notes (Signed)
Patient requests Lovanox to be given in upper, outer arm.  Lovanox is SQ - does not have to only be administered in abdomen.

## 2023-03-09 NOTE — Progress Notes (Signed)
Physical Therapy Treatment Patient Details Name: [name] MRN: [id number] DOB: 1970-07-24 Today's Date: 03/09/2023   History of Present Illness 52 y.o. female admitted on 01/27/2023 after being found unresponsive at home down in a pool of bloody vomit/stool. CPR was initiated upon arrival to ED, pt intubated and started on pressors. Pt undergoing management for inflammatory vs infections colitis. Pt extubated on 10/2. 10/10 underwent repeat proctosigmoidoscopy for ongoing diarrhea. Diffuse severe inflammation identified due to combination of Crohn's disease, active C. difficile infection and possible ischemic colitis from prior hypotension on presentation. 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 10/13 became tachycardic and hypotensive.  More lethargic and transferred back to ICU. On 10/15, blood cultures revealed fungemia. On 10/17, CT of abdomen and pelvis notable for septic emboli. Successful Left subclavian CVC placement with IR on 10/21. PMH includes COPD, Crohn's disease, HTN, DMII.    PT Comments  Patient progressing slowly.  Limited by tachycardia with dizziness during ambulation up to 155.  She seems eager to progress and participated well with in bed exercises for core and LE strength.  Feel she will need inpatient rehab (<3 hours/day) at d/c as no assistance available/lives alone.  PT will continue to follow.     If plan is discharge home, recommend the following: Assistance with cooking/housework;Assist for transportation;Direct supervision/assist for financial management;Help with stairs or ramp for entrance;A little help with bathing/dressing/bathroom;A little help with walking and/or transfers   Can travel by private vehicle        Equipment Recommendations  Rolling walker (2 wheels)    Recommendations for Other Services       Precautions / Restrictions Precautions Precautions: Fall Precaution Comments: watch HR/RR, L JP drain, new colostomy      Mobility  Bed Mobility Overal bed mobility: Needs Assistance Bed Mobility: Rolling, Sidelying to Sit Rolling: Supervision Sidelying to sit: Contact guard assist   Sit to supine: Supervision   General bed mobility comments: assist for technique, lines    Transfers Overall transfer level: Needs assistance Equipment used: Rolling walker (2 wheels) Transfers: Sit to/from Stand Sit to Stand: Contact guard assist           General transfer comment: assist for balance and lines    Ambulation/Gait Ambulation/Gait assistance: Contact guard assist Gait Distance (Feet): 20 Feet Assistive device: Rolling walker (2 wheels) Gait Pattern/deviations: Decreased stride length, Step-through pattern       General Gait Details: limited by symptomatic tachycardia to 155 and pt reported lightheadedness   Stairs             Wheelchair Mobility     Tilt Bed    Modified Rankin (Stroke Patients Only)       Balance Overall balance assessment: Needs assistance   Sitting balance-Leahy Scale: Fair     Standing balance support: Bilateral upper extremity supported Standing balance-Leahy Scale: Poor                              Cognition Arousal: Alert Behavior During Therapy: WFL for tasks assessed/performed Overall Cognitive Status: Within Functional Limits for tasks assessed                                          Exercises General Exercises - Lower Extremity Gluteal Sets: AROM, 5 reps, Both, Supine Short Arc Quad: AROM, 10 reps, Both,  Supine Hip ABduction/ADduction: Strengthening, Both, 10 reps, Supine (resisted hooklying abduction) Hip Flexion/Marching: AROM, Both, Supine, 10 reps (hooklying in bed)    General Comments General comments (skin integrity, edema, etc.): BP 105/76 after ambulation with HR up to 155      Pertinent Vitals/Pain Pain Assessment Pain Score: 7  Pain Location: abdomen Pain Descriptors / Indicators:  Guarding, Discomfort Pain Intervention(s): Monitored during session, Premedicated before session    Home Living                          Prior Function            PT Goals (current goals can now be found in the care plan section) Progress towards PT goals: Progressing toward goals    Frequency    Min 1X/week      PT Plan      Co-evaluation              AM-PAC PT "6 Clicks" Mobility   Outcome Measure  Help needed turning from your back to your side while in a flat bed without using bedrails?: A Little Help needed moving from lying on your back to sitting on the side of a flat bed without using bedrails?: A Little Help needed moving to and from a bed to a chair (including a wheelchair)?: A Little Help needed standing up from a chair using your arms (e.g., wheelchair or bedside chair)?: A Little Help needed to walk in hospital room?: A Little Help needed climbing 3-5 steps with a railing? : Total 6 Click Score: 16    End of Session Equipment Utilized During Treatment: Gait belt Activity Tolerance: Patient limited by fatigue;Treatment limited secondary to medical complications (Comment) (tachycardic with ambulation) Patient left: in bed;with call bell/phone within reach   PT Visit Diagnosis: Other abnormalities of gait and mobility (R26.89);Muscle weakness (generalized) (M62.81)     Time: 8295-6213 PT Time Calculation (min) (ACUTE ONLY): 25 min  Charges:    $Gait Training: 8-22 mins $Therapeutic Exercise: 8-22 mins PT General Charges $$ ACUTE PT VISIT: 1 Visit                     Sheran Lawless, PT Acute Rehabilitation Services Office:[phone] 03/09/2023    [name] 03/09/2023, 1:42 PM

## 2023-03-09 NOTE — Progress Notes (Signed)
Mobility Specialist Progress Note:   03/09/23 1545  Mobility  Activity Ambulated with assistance in room  Level of Assistance Standby assist, set-up cues, supervision of patient - no hands on  Assistive Device Front wheel walker  Distance Ambulated (ft) 30 ft  Activity Response Tolerated well  Mobility Referral Yes  $Mobility charge 1 Mobility  Mobility Specialist Start Time (ACUTE ONLY) 1525  Mobility Specialist Stop Time (ACUTE ONLY) 1535  Mobility Specialist Time Calculation (min) (ACUTE ONLY) 10 min   Pre Mobility: 114 HR  During Mobility: 126 HR  Post Mobility: 111 HR   Pt received in bed, agreeable to mobility. Denied any discomfort during ambulation, asx throughout. Pt returned to bed with call bell in reach and all needs met.   [name]  Mobility Specialist Please contact via [employer] office at [phone]

## 2023-03-09 NOTE — Plan of Care (Signed)

## 2023-03-09 NOTE — TOC Progression Note (Signed)
Transition of Care [facility]) - Progression Note    Patient Details  Name: [name] MRN: [id number] Date of Birth: [birth year]  Transition of Care [facility]) CM/SW Contact  Eduard Roux, [place] Phone Number: 03/09/2023, 3:39 PM  Clinical Narrative:     CSW met with patient at bedside. Patient confirmed, she remains agreeable to short term rehab at [facility]. She states her preferred SNF is [facility]. CSW explained the SNF process, including if she receives monthly disability income she may have to turn over her disability to the SNF while there for for rehab. Patient reports she was jsut approved for disability. Patient states understanding. CSW explain the importance to continue to work w/ PT and OT.  [facility] states no bed availability at this time. CSW asked SNF/[facility]  if they could determine if she has SNF benefits- waiting on response  TOC will follow and assist with discharge planning.   Antony Blackbird, MSW, LCSW Clinical Social Worker    Expected Discharge Plan: Skilled Nursing Facility Barriers to Discharge: Continued Medical Work up  Expected Discharge Plan and Services In-house Referral: Clinical Social Work Discharge Planning Services: CM Consult   Living arrangements for the past 2 months: Single Family Home                                       Social Determinants of Health (SDOH) Interventions SDOH Screenings   Food Insecurity: No Food Insecurity (03/05/2023)  Housing: Low Risk  (03/05/2023)  Transportation Needs: No Transportation Needs (03/05/2023)  Utilities: Not At Risk (03/05/2023)  Financial Resource Strain: Medium Risk (10/18/2022)   Received from [facility]  Social Connections: Unknown (09/01/2021)   Received from [facility], [facility]  Stress: Stress Concern Present (06/25/2020)   Received from [facility], [facility]  Tobacco Use: High Risk (02/18/2023)    Readmission Risk Interventions     No data to display

## 2023-03-09 NOTE — Plan of Care (Signed)

## 2023-03-09 NOTE — Progress Notes (Incomplete)
ID Brief note  Chart reviewed, not seen in person  Afebrile Last lab work with no leukocytosis Appears to be stable  Candida glabrata is S to micafungin and S to Fluconazole DD  Last note from Dr Thedore Mins reviewed   No new recommendations at this time.   Call us back when close to Micafungin EOT 11/27 for re-assessment and +/- switch to PO antifungals   Recall [place] if needed, or with questions   Odette Fraction, MD Infectious Disease Physician [facility] [address] [place], [place] [address] Phone: [phone]  Fax: [phone]

## 2023-03-09 NOTE — Progress Notes (Signed)
Occupational Therapy Treatment Patient Details Name: [name] MRN: [id number] DOB: 11-02-1970 Today's Date: 03/09/2023   History of present illness 52 y.o. female admitted on 01/27/2023 after being found unresponsive at home down in a pool of bloody vomit/stool. CPR was initiated upon arrival to ED, pt intubated and started on pressors. Pt undergoing management for inflammatory vs infections colitis. Pt extubated on 10/2. 10/10 underwent repeat proctosigmoidoscopy for ongoing diarrhea. Diffuse severe inflammation identified due to combination of Crohn's disease, active C. difficile infection and possible ischemic colitis from prior hypotension on presentation. 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 10/13 became tachycardic and hypotensive.  More lethargic and transferred back to ICU. On 10/15, blood cultures revealed fungemia. On 10/17, CT of abdomen and pelvis notable for septic emboli. Successful Left subclavian CVC placement with IR on 10/21. PMH includes COPD, Crohn's disease, HTN, DMII.   OT comments  OT session focused on bed mobility for carryover to functional tasks and B UE therapeutic exercises (see details below) seated EOB to increase strength, activity tolerance, and sitting balance for carryover to functional tasks. Pt participated well in session and is making progress toward goals. Pt's HR was between 117 and 123 bpm throughout session with BP and O2 WNL. Pt will benefit from continued acute skilled OT services to address deficits outlined below and increase safety and independence with functional tasks. Discharge plan updated this day based on pt needs and progress. Post acute discharge, pt will benefit from intensive inpatient skilled rehab services < 3 hours per day to maximize rehab potential.       If plan is discharge home, recommend the following:  A little help with walking and/or transfers;Assistance with cooking/housework;Direct supervision/assist for  medications management;Assist for transportation;Help with stairs or ramp for entrance;A lot of help with bathing/dressing/bathroom   Equipment Recommendations  Other (comment) (defer to next level of care)    Recommendations for Other Services      Precautions / Restrictions Precautions Precautions: Fall Precaution Comments: watch HR/RR, L JP drain, new colostomy Restrictions Weight Bearing Restrictions: No       Mobility Bed Mobility Overal bed mobility: Needs Assistance Bed Mobility: Rolling, Supine to Sit, Sit to Supine Rolling: Modified independent (Device/Increase time)   Supine to sit: Supervision, Used rails, HOB elevated Sit to supine: Supervision   General bed mobility comments: assist for technique, lines; with mildly increased time    Transfers Overall transfer level: Needs assistance Equipment used: Rolling walker (2 wheels) Transfers: Sit to/from Stand Sit to Stand: Contact guard assist           General transfer comment: Pt demonstrates ability to side step toward [facility] with RW for repositioning with Contact guard assist for balance and lines     Balance Overall balance assessment: Needs assistance Sitting-balance support: Feet supported, No upper extremity supported Sitting balance-Leahy Scale: Fair (Fair+/Good-)     Standing balance support: Bilateral upper extremity supported, During functional activity, Reliant on assistive device for balance Standing balance-Leahy Scale: Poor Standing balance comment: with RW support                           ADL either performed or assessed with clinical judgement   ADL Overall ADL's : Needs assistance/impaired Eating/Feeding: Set up;Sitting   Grooming: Set up;Sitting  General ADL Comments: Pt declined further ADLs and OOB activity this session secondary to having been up with NT, PT, and mobility specialist earlier this day.    Extremity/Trunk  Assessment Upper Extremity Assessment Upper Extremity Assessment: Generalized weakness   Lower Extremity Assessment Lower Extremity Assessment: Defer to PT evaluation        Vision       Perception     Praxis      Cognition Arousal: Alert Behavior During Therapy: [facility] for tasks assessed/performed Overall Cognitive Status: Within Functional Limits for tasks assessed                                 General Comments: Pt AAOx4 and pleasant throughout.        Exercises Exercises: General Upper Extremity, Hand exercises General Exercises - Upper Extremity Shoulder Flexion: AROM, Both, Strengthening, Other reps (comment), Seated (seated EOB; 3 sets of 10; increased activity tolerance; increased sitting balance) Shoulder Extension: AROM, Strengthening, Both, Other reps (comment), Seated (seated EOB; 3 sets of 10; increased activity tolerance; increased sitting balance) Shoulder ABduction: AROM, Strengthening, Both, Other reps (comment), Seated (seated EOB; 3 sets of 10; increased activity tolerance; increased sitting balance) Shoulder ADduction: AROM, Strengthening, Both, Other reps (comment), Seated (seated EOB; 3 sets of 10; increased activity tolerance; increased sitting balance) Elbow Flexion: AROM, Strengthening, Both, Other reps (comment), Seated (seated EOB; 3 sets of 10; increased activity tolerance; increased sitting balance) Elbow Extension: AROM, Strengthening, Both, Other reps (comment), Seated (seated EOB; 3 sets of 10; increased activity tolerance; increased sitting balance) Wrist Flexion: AROM, Strengthening, Both, Other reps (comment), Seated (seated EOB; 3 sets of 10; increased activity tolerance; increased sitting balance) Wrist Extension: AROM, Strengthening, Both, Other reps (comment), Seated (seated EOB; 3 sets of 10; increased activity tolerance; increased sitting balance) Hand Exercises Forearm Supination: AROM, Strengthening, Left, Other reps  (comment), Seated (seated EOB; 2 sets of 10; increased activity tolerance; increased sitting balance) Forearm Pronation: AROM, Strengthening, Both, Other reps (comment), Seated (seated EOB; 2 sets of 10; increased activity tolerance; increased sitting balance) Wrist Ulnar Deviation: AROM, Strengthening, Both, Other reps (comment), Seated (seated EOB; 2 sets of 10; increased activity tolerance; increased sitting balance) Wrist Radial Deviation: AROM, Strengthening, Both, Other reps (comment), Seated (seated EOB; 2 sets of 10; increased activity tolerance; increased sitting balance)    Shoulder Instructions       General Comments HR between 117 and 123 bpm throughout session. All other VSS throughout session.    Pertinent Vitals/ Pain       Pain Assessment Pain Assessment: Faces Faces Pain Scale: Hurts a little bit Pain Location: abdomen Pain Descriptors / Indicators: Guarding, Discomfort Pain Intervention(s): Limited activity within patient's tolerance, Monitored during session, Premedicated before session, Repositioned  Home Living                                          Prior Functioning/Environment              Frequency  Min 1X/week        Progress Toward Goals  OT Goals(current goals can now be found in the care plan section)  Progress towards OT goals: Progressing toward goals  Acute Rehab OT Goals Patient Stated Goal: To continue to feel better  Plan      Co-evaluation  AM-PAC OT "6 Clicks" Daily Activity     Outcome Measure   Help from another person eating meals?: A Little Help from another person taking care of personal grooming?: A Little Help from another person toileting, which includes using toliet, bedpan, or urinal?: Total Help from another person bathing (including washing, rinsing, drying)?: A Lot Help from another person to put on and taking off regular upper body clothing?: A Little Help from another  person to put on and taking off regular lower body clothing?: A Lot 6 Click Score: 14    End of Session Equipment Utilized During Treatment: Rolling walker (2 wheels)  OT Visit Diagnosis: Muscle weakness (generalized) (M62.81);Other (comment);Unsteadiness on feet (R26.81) (Decreased activity tolerance)   Activity Tolerance Patient tolerated treatment well   Patient Left in bed;with call bell/phone within reach;with bed alarm set   Nurse Communication Mobility status        Time: 5409-8119 OT Time Calculation (min): 28 min  Charges: OT General Charges $OT Visit: 1 Visit OT Treatments $Therapeutic Activity: 8-22 mins $Therapeutic Exercise: 8-22 mins  Teriann Livingood "Orson Eva., OTR/L, MA Acute Rehab [phone]   Lendon Colonel 03/09/2023, 4:52 PM

## 2023-03-09 NOTE — NC FL2 (Signed)
[place] MEDICAID FL2 LEVEL OF CARE FORM     IDENTIFICATION  Patient Name: [name] Birthdate: October 12, 1970 Sex: female Admission Date (Current Location): 01/27/2023  [facility] [place] and [place] Number:  Producer, television/film/video and Address:  [facility] [place], 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   Cardiac arrest (HCC) 01/27/2023   Acute respiratory failure with hypoxia (HCC) 01/27/2023   Upper GI bleed 01/27/2023   GI bleed 01/15/2022   Rectal bleeding 01/12/2022   Acute deep vein thrombosis (DVT) of brachial vein of right upper extremity (HCC)    Enteritis due to Clostridium difficile 12/24/2021   Proctocolitis 12/23/2021   UTI (urinary tract infection) 12/23/2021   Thrombocytosis 10/02/2021   Gait instability 10/02/2021   Hypokalemia 10/01/2021   Skin test reaction, systemic 09/07/2021   N&V (nausea and vomiting) 04/30/2021   Abdominal pain    Bloating    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HCC)    Hematochezia 02/04/2021   Crohn's disease of large  intestine with other complication (HCC) 02/03/2021   Diarrhea    Metabolic encephalopathy 01/20/2021   Moderate Pericardial effusion 01/19/2021   Right foot pain    AKI (acute kidney injury) (HCC) 01/16/2021   Rhabdomyolysis 01/16/2021   Lactic acidosis 01/16/2021   COVID-19 virus infection 01/16/2021   Shock (HCC) 01/16/2021   Seizure (HCC) 06/24/2020   MDD (major depressive disorder) 03/24/2020   Chest pain 02/07/2020   Chronic patellofemoral pain of both knees 01/11/2020   Essential hypertension 06/17/2018   Insomnia 12/13/2017   Neuropathy 12/13/2017   Chest pain at rest 05/09/2016   COPD (chronic obstructive pulmonary disease) (HCC)    Crohn's disease with complication (HCC)    Diabetes mellitus without complication (HCC)    Anxiety    Cigarette smoker    PTSD (post-traumatic stress disorder)    Palpitation    Constipation 09/20/2012   Head injury 05/09/2012   Hyperlipidemia 04/11/2012   DJD (degenerative joint disease), lumbar 04/04/2012   Paroxysmal SVT (supraventricular tachycardia) (HCC) 04/04/2012   Tobacco dependence 04/04/2012   DOE (dyspnea on exertion) 01/26/2012   Paresthesia 11/25/2011   Abdominal pain, chronic, epigastric 11/01/2011   Abdominal pain, acute, periumbilical 11/01/2011   GERD (gastroesophageal reflux disease)    Hyperglycemia    Chronic low back pain    IBS (irritable bowel syndrome)    Closed head injury    TOBACCO ABUSE 07/13/2010   Palpitations 07/13/2010   Pleuritic chest pain 07/13/2010   Anxiety state 07/03/2008   BACK PAIN, LUMBAR, CHRONIC 07/03/2008    Orientation RESPIRATION BLADDER Height &  Weight     Self, Time, Situation, Place  Normal External catheter, Incontinent Weight: 131 lb 6.3 oz (59.6 kg) Height:  5' 2.99" (160 cm)  BEHAVIORAL SYMPTOMS/MOOD NEUROLOGICAL BOWEL NUTRITION STATUS      Ileostomy, Continent Diet (Please see discharge summary)  AMBULATORY STATUS COMMUNICATION OF NEEDS Skin   Limited Assist Verbally  Surgical wounds (closed incision abdomen, wound incision irritant dematitis MASD, negative pressure wound therapy abdomen)                       Personal Care Assistance Level of Assistance  Bathing, Feeding, Dressing Bathing Assistance: Limited assistance Feeding assistance: Independent Dressing Assistance: Limited assistance     Functional Limitations Info  Sight, Hearing, Speech Sight Info: Adequate Hearing Info: Adequate Speech Info: Adequate    SPECIAL CARE FACTORS FREQUENCY  PT (By licensed PT), OT (By licensed OT)     PT Frequency: 5x per wek OT Frequency: 5x per week            Contractures Contractures Info: Not present    Additional Factors Info  Code Status, Allergies, Isolation Precautions Code Status Info: FULL Allergies Info: Codeine,Hydrocodone,ketorolac,Morphine,penicillins,nickel,pregabalin,acetaminophen,atarax,ativan,strawberry extract,watermelon concentrate,albuterol,benadryl,latex,tramadol,humira     Isolation Precautions Info: MRSA     Current Medications (03/09/2023):  This is the current hospital active medication list Current Facility-Administered Medications  Medication Dose Route Frequency Provider Last Rate Last Admin   acetaminophen (TYLENOL) tablet 650 mg  650 mg Oral Q6H WA Candelaria Stagers T, MD   650 mg at 03/09/23 0834   ALPRAZolam (XANAX) tablet 0.25 mg  0.25 mg Oral TID PRN Azucena Fallen, MD   0.25 mg at 03/09/23 0834   amitriptyline (ELAVIL) tablet 25 mg  25 mg Oral QHS Sande Rives, MD   25 mg at 03/08/23 2117   bismuth subsalicylate (PEPTO BISMOL) 262 MG/15ML suspension 30 mL  30 mL Oral Q4H PRN Azucena Fallen, MD   30 mL at 02/19/23 1055   camphor-menthol (SARNA) lotion   Topical PRN Osvaldo Shipper, MD   Given at 03/04/23 0014   Chlorhexidine Gluconate Cloth 2 % PADS 6 each  6 each Topical Daily Sande Rives, MD   6 each at 03/09/23 0839   dextrose 50 % solution 50 mL  1 ampule Intravenous PRN Sande Rives, MD       diphenoxylate-atropine (LOMOTIL) 2.5-0.025 MG per tablet 1 tablet  1 tablet Oral QID Almon Hercules, MD   1 tablet at 03/09/23 0834   DULoxetine (CYMBALTA) DR capsule 30 mg  30 mg Oral Daily Sande Rives, MD   30 mg at 03/09/23 0837   enoxaparin (LOVENOX) injection 60 mg  60 mg Subcutaneous Q12H Candelaria Stagers T, MD   60 mg at 03/09/23 0012   feeding supplement (ENSURE ENLIVE / ENSURE PLUS) liquid 237 mL  237 mL Oral TID BM Azucena Fallen, MD   237 mL at 03/09/23 0013   fentaNYL (DURAGESIC) 25 MCG/HR 1 patch  1 patch Transdermal Q72H Sande Rives, MD   1 patch at 03/07/23 1252   folic acid (FOLVITE) tablet 1 mg  1 mg Oral Daily Doristine Counter, RPH   1 mg at 03/09/23 0834   gabapentin (NEURONTIN) capsule 100 mg  100 mg Oral TID Doristine Counter, RPH   100 mg at 03/09/23 5366   Gerhardt's butt cream   Topical BID Sande Rives, MD   Given at 03/09/23 [id number]  HYDROmorphone (DILAUDID) injection 0.5 mg  0.5 mg Intravenous Q6H PRN Gonfa, Taye T, MD       HYDROmorphone (DILAUDID) tablet 4 mg  4 mg Oral Q4H PRN Gonfa, Taye T, MD       ivabradine (CORLANOR) tablet 5 mg  5 mg Oral BID WC Gonfa, Taye T, MD       levalbuterol (XOPENEX) nebulizer solution 0.63 mg  0.63 mg Nebulization Q8H PRN Sande Rives, MD       levETIRAcetam (KEPPRA) tablet 750 mg  750 mg Oral BID Jardin, Carla G, RPH   750 mg at 03/09/23 0012   lidocaine (LIDODERM) 5 % 2 patch  2 patch Transdermal Q24H Sande Rives, MD   2 patch at 03/03/23 0939   lidocaine (PF) (XYLOCAINE) 1 % injection 8 mL  8 mL Intradermal Once [place], Aimee H, PA-C       loperamide (IMODIUM) capsule 4 mg  4 mg Oral QID Sande Rives, MD   4 mg at 03/09/23 0834   loratadine (CLARITIN) tablet 10 mg  10 mg Oral Daily Osvaldo Shipper, MD   10 mg at 03/09/23 1610   magic mouthwash  1 mL Oral TID PRN Sande Rives, MD   1 mL at 02/02/23 1836   methocarbamol (ROBAXIN) tablet 1,000 mg  1,000 mg  Oral TID Azucena Fallen, MD   1,000 mg at 03/09/23 0835   metoprolol tartrate (LOPRESSOR) injection 2.5 mg  2.5 mg Intravenous Q4H PRN Osvaldo Shipper, MD   2.5 mg at 03/09/23 0827   metoprolol tartrate (LOPRESSOR) tablet 62.5 mg  62.5 mg Oral BID Candelaria Stagers T, MD   62.5 mg at 03/08/23 2116   micafungin (MYCAMINE) 150 mg in sodium chloride 0.9 % 100 mL IVPB  150 mg Intravenous Q24H Sande Rives, MD 107.5 mL/hr at 03/08/23 0827 150 mg at 03/08/23 0827   multivitamin with minerals tablet 1 tablet  1 tablet Oral Daily Sande Rives, MD   1 tablet at 03/09/23 0834   ondansetron (ZOFRAN) injection 4 mg  4 mg Intravenous Q6H PRN Sande Rives, MD   4 mg at 03/08/23 0819   Oral care mouth rinse  15 mL Mouth Rinse PRN Azucena Fallen, MD       pantoprazole (PROTONIX) EC tablet 40 mg  40 mg Oral Daily Doristine Counter, RPH   40 mg at 03/09/23 9604   prochlorperazine (COMPAZINE) injection 5 mg  5 mg Intravenous Q6H PRN Candelaria Stagers T, MD   5 mg at 03/08/23 1030   revefenacin (YUPELRI) nebulizer solution 175 mcg  175 mcg Nebulization Daily Sande Rives, MD   175 mcg at 03/09/23 0744   thiamine (VITAMIN B1) tablet 100 mg  100 mg Oral Daily Doristine Counter, RPH   100 mg at 03/09/23 5409   vancomycin (VANCOCIN) capsule 125 mg  125 mg Oral BID Danelle Earthly, MD   125 mg at 03/09/23 0012   Followed by   Melene Muller ON 03/13/2023] vancomycin (VANCOCIN) capsule 125 mg  125 mg Oral Daily Danelle Earthly, MD       Followed by   Melene Muller ON 03/20/2023] vancomycin (VANCOCIN) capsule 125 mg  125 mg Oral Phillis Knack, MD       Followed by   Melene Muller ON 03/28/2023] vancomycin (VANCOCIN) capsule 125 mg  125 mg Oral Q3 days Danelle Earthly, MD         Discharge Medications: Please see discharge summary for  a list of discharge medications.  Relevant Imaging Results:  Relevant Lab Results:   Additional Information [id number]  Eduard Roux, LCSW

## 2023-03-10 DIAGNOSIS — B49 Unspecified mycosis: Secondary | ICD-10-CM | POA: Diagnosis not present

## 2023-03-10 DIAGNOSIS — I469 Cardiac arrest, cause unspecified: Secondary | ICD-10-CM | POA: Diagnosis not present

## 2023-03-10 DIAGNOSIS — D62 Acute posthemorrhagic anemia: Secondary | ICD-10-CM | POA: Diagnosis not present

## 2023-03-10 DIAGNOSIS — A0472 Enterocolitis due to Clostridium difficile, not specified as recurrent: Secondary | ICD-10-CM | POA: Diagnosis not present

## 2023-03-10 LAB — RENAL FUNCTION PANEL
Albumin: 3 g/dL — ABNORMAL LOW (ref 3.5–5.0)
Anion gap: 12 (ref 5–15)
BUN: 10 mg/dL (ref 6–20)
CO2: 25 mmol/L (ref 22–32)
Calcium: 10.2 mg/dL (ref 8.9–10.3)
Chloride: 90 mmol/L — ABNORMAL LOW (ref 98–111)
Creatinine, Ser: 0.85 mg/dL (ref 0.44–1.00)
GFR, Estimated: >60 mL/min (ref >60)
Glucose, Bld: 94 mg/dL (ref 70–99)
Phosphorus: 3.8 mg/dL (ref 2.5–4.6)
Potassium: 3.7 mmol/L (ref 3.5–5.1)
Sodium: 127 mmol/L — ABNORMAL LOW (ref 135–145)

## 2023-03-10 LAB — CBC
HCT: 34.3 % — ABNORMAL LOW (ref 36.0–46.0)
Hemoglobin: 11 g/dL — ABNORMAL LOW (ref 12.0–15.0)
MCH: 27.8 pg (ref 26.0–34.0)
MCHC: 32.1 g/dL (ref 30.0–36.0)
MCV: 86.6 fL (ref 80.0–100.0)
Platelets: 961 10*3/uL (ref 150–400)
RBC: 3.96 MIL/uL (ref 3.87–5.11)
RDW: 15.8 % — ABNORMAL HIGH (ref 11.5–15.5)
WBC: 11 10*3/uL — ABNORMAL HIGH (ref 4.0–10.5)
nRBC: 0 % (ref 0.0–0.2)

## 2023-03-10 LAB — MAGNESIUM: Magnesium: 1.3 mg/dL — ABNORMAL LOW (ref 1.7–2.4)

## 2023-03-10 MED ORDER — HYDROMORPHONE HCL 1 MG/ML IJ SOLN
0.5000 mg | Freq: Four times a day (QID) | INTRAMUSCULAR | Status: DC | PRN
  Administered 2023-03-10 – 2023-03-13 (×6): 0.5 mg via INTRAVENOUS
  Filled 2023-03-10 (×6): qty 0.5

## 2023-03-10 MED ORDER — MAGNESIUM SULFATE 4 GM/100ML IV SOLN
4.0000 g | Freq: Once | INTRAVENOUS | Status: AC
  Administered 2023-03-10: 4 g via INTRAVENOUS
  Filled 2023-03-10: qty 100

## 2023-03-10 MED ORDER — POTASSIUM CHLORIDE 20 MEQ PO PACK
40.0000 meq | PACK | Freq: Once | ORAL | Status: AC
  Administered 2023-03-10: 40 meq via ORAL
  Filled 2023-03-10: qty 2

## 2023-03-10 MED ORDER — HYDROMORPHONE HCL 2 MG PO TABS
4.0000 mg | ORAL_TABLET | ORAL | Status: DC | PRN
  Administered 2023-03-10 – 2023-03-19 (×40): 4 mg via ORAL
  Filled 2023-03-10 (×40): qty 2

## 2023-03-10 NOTE — Plan of Care (Signed)

## 2023-03-10 NOTE — Progress Notes (Signed)
Mobility Specialist Progress Note:   03/10/23 1217  Mobility  Activity Ambulated with assistance in room  Level of Assistance Standby assist, set-up cues, supervision of patient - no hands on  Assistive Device Front wheel walker  Distance Ambulated (ft) 30 ft  Activity Response Tolerated well  Mobility Referral Yes  $Mobility charge 1 Mobility  Mobility Specialist Start Time (ACUTE ONLY) 1120  Mobility Specialist Stop Time (ACUTE ONLY) 1133  Mobility Specialist Time Calculation (min) (ACUTE ONLY) 13 min   Pre Mobility: 77 HR During Mobility: 93-100 HR  Post Mobility: 96 HR   Pt received in bed, agreeable to mobility. Pt c/o slight dizziness when standing, otherwise asymptomatic throughout. Mobility improving but pt declining further ambulation. Pt returned to bed with call bell in reach and all needs met.   Leory Plowman  Mobility Specialist Please contact via [employer] office at [phone]

## 2023-03-10 NOTE — Progress Notes (Signed)
PROGRESS NOTE  [name] WUJ:[id number] DOB: December 04, 1970   PCP: [name], PA-C  Patient is from: Home  DOA: 01/27/2023 LOS: 42  Chief complaints Chief Complaint  Patient presents with   Respiratory Arrest     Brief Narrative / Interim history: 52 year old female with past medical history of COPD, Crohn's on Skyrizi, hypertension, type 2 diabetes mellitus who initially presented to the after being found unresponsive. Per family, patient has had bloody emesis with diarrhea over the past few days. Initial systolics in the 60s in the ED, and patient was minimally responsive. In the emergency room, patient was intubated and admitted to the ICU for septic shock and encephalopathy on 01/27/2023.  For further evaluation and management for shock and altered mental status.   Further workup revealed C. difficile colitis, intra-abdominal infection with abscess and fungemia.  ID consulted and started on prolonged p.o. vancomycin with taper and micafungin in addition to IV antibiotics for intra-abdominal abscess.  Eventually, she came off vasopressors on 9/29 and extubated on 10/2 and transferred to floor on 10/3.   Patient with ongoing diarrhea and had repeat proctosigmoidoscopy that showed severe inflammation likely from underlying Crohn's disease, active C. difficile infection and possible ischemic colitis from prior hypotension on presentation.  She underwent ex lap with total total colectomy and ileostomy on 10/11.  Intraoperatively, found to have frank necrosis with pelvic abscess formation and significant colitis. Unfortunately, she became tachycardic with hypotension and lethargy and transferred back to ICU on 10/13.  Blood culture on 10/15 grew Candida glabrata for which she was started on micafungin.  CT abdomen and pelvis on 10/16 concerning for incidental small PE to RLL pulmonary artery branch raising concern for septic emboli.  She underwent TEE on 10/18 that was negative for  endocarditis.  Patient completed course for antibiotics and p.o. vancomycin.  Remains on micafungin for fungemia for a total of 6 weeks from 10/16.  ID following intermittently.  In regards to abdominal wound, wound VAC removed.  Surgery recommended wet-to-dry dressing.  Surgery following weekly.  Therapy recommended CIR but turned it down by CIR due lack of 24/7 supervision and need for prolonged rehabilitation.  Plan is discharge to SNF once tachycardia and diarrhea fairly controlled.  We have been titrating metoprolol.  Added ivabradine on 11/6.  Weaning of IV pain medication.    Subjective: Seen and examined earlier this morning.  No major events overnight of this morning.  Pain improved.  Tachycardia improved as well.  Number 1.2 L output from ostomy bag.  Objective: Vitals:   03/10/23 0756 03/10/23 0821 03/10/23 0824 03/10/23 1213  BP: (!) 134/45 125/69 125/69 115/69  Pulse: (!) 111 (!) 111 (!) 113   Resp: 12 17  12   Temp: 97.7 F (36.5 C) 97.7 F (36.5 C)  97.6 F (36.4 C)  TempSrc: Oral Oral  Oral  SpO2: 97%     Weight:      Height:        Examination:  GENERAL: No apparent distress.  Nontoxic. HEENT: MMM.  Vision and hearing grossly intact.  No tenderness with palpation over temporal regions. NECK: Supple.  No apparent JVD.  RESP:  No IWOB.  Fair aeration bilaterally. CVS: HR in 90s.  Regular rhythm.  Heart sounds normal.  ABD/GI/GU: BS+.  Laparotomy wound.  No signs of infection.  Ileostomy bag with liquid stool.  JP drain to LLQ. MSK/EXT:  Moves extremities. No apparent deformity. No edema.  SKIN: no apparent skin lesion or wound NEURO:  Awake, alert and oriented appropriately.  No apparent focal neuro deficit. PSYCH: Calm. Normal affect.    Microbiology summarized: 9/26-COVID-19, influenza and RSV PCR nonreactive 9/26 and 10/13-MRSA PCR screen negative 9/26-blood culture with staph hominis in 1 out of 2 bottles likely contaminant 9/29-C. difficile PCR  positive 9/29-GIP negative 10/12-blood culture with Candida glabrata in 1 out of 2 bottles 10/17-blood cultures NGTD 10/27-pleural fluid culture NGTD  Assessment and plan: Septic shock, multifactorial-including C. difficile colitis, fungemia, intra-abdominal abscess Fungemia, candida glabrata, likely POA Cdiff Colitis: POA.  Completed treatment course. Septic emboli, thrombocytosis Intra-abdominal infection/abscess -Culture data as above. -S/p laparotomy with total colectomy with ileostomy and LLQ JP drain -Received ceftriaxone and Flagyl for extended course until 10/28. -TTE/TEE negative for vegetation.  Bubble study negative. -Appreciate input by ID -Continue antifungal for 6 weeks for fungemia.  Started on 10/16>>>. -Complete 14 days of p.o. vancomycin followed by taper for C. Difficile-completed. -Ophthalmology or neuro consultation.  -Evaluated by ophthalmology on 10/23.  No evidence of fungal infiltrate on funduscopic evaluation -Left subclavian tunneled CVC placed 02/21/2023 with IR -Leukocytosis resolved.  Still with diarrhea probably due to colectomy. -Wound care per general surgery and wound care staff.  WTD daily -Continue fentanyl patch.  Increase p.o. Dilaudid to 4 mg every 4 hours as needed moderate pain -Decreased IV Dilaudid to 0.5 mg every 6 hours as needed severe pain -Patient has been encouraged to minimize IV Dilaudid.   Intractable abdominal pain: Likely due to the above. Left-sided chest pain: EKG and troponin negative.  Chest pain resolved.  Left-sided headache-Attributing to oxycodone.  Oxycodone stopped.  Resolved. Nausea without emesis -Pain regimen as above. -Continue home amitriptyline, gabapentin and Cymbalta -Add Compazine for refractory nausea and vomiting  Moderate to severe malnutrition Postoperative short bowel syndrome/high output ostomy Malabsorption likely acute on chronic -Increased Lomotil to 4 times daily on 11/4 -Continue Imodium 4 mg 4  times daily -Encourage oral intake. -Optimize nutrition -Not a candidate for TPN due to fungemia  Sinus tachycardia: Likely due to acute illness and possible dehydration.  TSH normal.  He had some pericardial effusion without tamponade.  Repeat CT on 10/25 did not reveal pericardial effusion.  HR improved after adding ivabradine -Increased metoprolol to 62.5 mg twice daily on 11/3. -Added ivabradine 5 mg twice daily on 11/6 -May consider repeat echocardiogram if no improvement   Left-sided pleural effusion/iatrogenic pneumothorax, small: S/p thoracocentesis on 10/27.  Fluid is exudative by LDH.  Fluid culture NGTD.  Subsequent chest x-ray on 10/28 without pneumothorax.  Cytology negative. 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  Previous provider discussed with Dr. Cardell Peach with urology who recommended outpatient evaluation with cystoscopy  Acute pulmonary emboli/right upper extremity DVT -Continue Lovenox   Thrombocytosis: Platelet about 1200k. Evaluated by hematology and felt to be inflammatory response.  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   -Monitor intermittently -Outpatient follow-up with hematology   Longstanding Crohn's disease: Not chronically on steroids so less concern for adrenal insufficiency. S/p colectomy -Steroids/Skyrizi held in the setting of infection.  Pericardial effusion without tamponade: Initially seen on TTE.  Was not seen on CTA on 10/25  -May need outpatient echocardiogram to confirm resolution.  Cardiac arrest secondary to septic shock requiring CPR on admission   Controlled DM-2 with hyperglycemia: A1c 5.8% on 9/26.  CBG consistently in low 100s.  -Discontinued SSI and CBG monitoring   History of seizures -Continue PTA keppra/gabapentin  History of anxiety -Resume home Xanax, Cymbalta, hydroxyzine   History of hypertension -Metoprolol as above   History of COPD:  Stable -Continue Yupelri daily and xopenex PRN   GERD -Continue PTA PPI   Normocytic Anemia: Stable -Transfuse for Hb <7 or hemodynamically significant bleeding  Hypokalemia/hypomagnesemia -Monitor replenish as appropriate   Inadequate oral intake/weight loss: Lost about 30 pounds so far. Body mass index is 23.28 kg/m. Nutrition Problem: Inadequate oral intake Etiology: inability to eat Signs/Symptoms: NPO status Interventions: Refer to RD note for recommendations   DVT prophylaxis:  SCDs Start: 01/27/23 1111  Code Status: Full code Family Communication: None at bedside Level of care: Telemetry Cardiac Status is: Inpatient Remains inpatient appropriate because: Abdominal pain, tachycardia, high ostomy output   Final disposition: SNF Consultants:  Pulmonology General Surgery Hematology Urology Infectious disease Ophthalmology   35 minutes with more than 50% spent in reviewing records, counseling patient/family and coordinating care.   Sch Meds:  Scheduled Meds:  acetaminophen  650 mg Oral Q6H WA   amitriptyline  25 mg Oral QHS   Chlorhexidine Gluconate Cloth  6 each Topical Daily   diphenoxylate-atropine  1 tablet Oral QID   DULoxetine  30 mg Oral Daily   enoxaparin (LOVENOX) injection  60 mg Subcutaneous Q12H   feeding supplement  237 mL Oral TID BM   fentaNYL  1 patch Transdermal Q72H   folic acid  1 mg Oral Daily   gabapentin  100 mg Oral TID   Gerhardt's butt cream   Topical BID   ivabradine  5 mg Oral BID WC   levETIRAcetam  750 mg Oral BID   lidocaine  2 patch Transdermal Q24H   lidocaine (PF)  8 mL Intradermal Once   loperamide  4 mg Oral QID   loratadine  10 mg Oral Daily   methocarbamol  1,000 mg Oral TID   metoprolol tartrate  62.5 mg Oral BID   multivitamin with minerals  1 tablet Oral Daily   pantoprazole  40 mg Oral Daily   revefenacin  175 mcg Nebulization Daily   sodium chloride flush  10-40 mL Intracatheter Q12H   thiamine  100 mg  Oral Daily   vancomycin  125 mg Oral BID   Followed by   Melene Muller ON 03/13/2023] vancomycin  125 mg Oral Daily   Followed by   Melene Muller ON 03/20/2023] vancomycin  125 mg Oral QODAY   Followed by   Melene Muller ON 03/28/2023] vancomycin  125 mg Oral Q3 days   Continuous Infusions:  micafungin (MYCAMINE) 150 mg in sodium chloride 0.9 % 100 mL IVPB 150 mg (03/10/23 1207)   PRN Meds:.ALPRAZolam, bismuth subsalicylate, camphor-menthol, dextrose, HYDROmorphone (DILAUDID) injection, HYDROmorphone, levalbuterol, magic mouthwash, metoprolol tartrate, ondansetron (ZOFRAN) IV, mouth rinse, prochlorperazine, sodium chloride flush  Antimicrobials: Anti-infectives (From admission, onward)    Start     Dose/Rate Route Frequency Ordered Stop   03/28/23 1000  vancomycin (VANCOCIN) capsule 125 mg       Placed in "Followed by" Linked Group   125 mg Oral Every 3 DAYS 03/01/23 0750 04/06/23 0959   03/20/23 1000  vancomycin (VANCOCIN) capsule 125 mg       Placed in "Followed by" Linked Group   125 mg Oral Every other day 03/01/23 0750 03/28/23 0959   03/13/23 1000  vancomycin (VANCOCIN) capsule 125 mg       Placed in "Followed by" Linked Group   125 mg Oral Daily 03/01/23 0750 03/20/23 0959   03/06/23 1000  vancomycin (VANCOCIN) capsule 125 mg       Placed in "Followed by" Linked Group   125 mg Oral 2 times daily 03/01/23 0750 03/13/23 0959   03/01/23 1000  vancomycin (VANCOCIN) capsule 125 mg       Placed in "Followed by" Linked Group   125 mg Oral 4 times daily 03/01/23 0750 03/05/23 2134   02/18/23 1800  vancomycin (VANCOCIN) 50 mg/mL oral solution SOLN 125 mg        125 mg Oral 4 times daily 02/18/23 1439 02/26/23 0959   02/18/23 0400  micafungin (MYCAMINE) 150 mg in sodium chloride 0.9 % 100 mL IVPB        150 mg 107.5 mL/hr over 1 Hours Intravenous Every 24 hours 02/17/23 0936     02/17/23 0900  vancomycin (VANCOREADY) IVPB 750 mg/150 mL  Status:  Discontinued        750 mg 150 mL/hr over 60 Minutes  Intravenous Every 8 hours 02/17/23 0204 02/17/23 0947   02/17/23 0400  micafungin (MYCAMINE) 100 mg in sodium chloride 0.9 % 100 mL IVPB  Status:  Discontinued        100 mg 105 mL/hr over 1 Hours Intravenous Every 24 hours 02/16/23 0353 02/17/23 0936   02/16/23 1045  vancomycin (VANCOCIN) 50 mg/mL oral solution SOLN 125 mg  Status:  Discontinued        125 mg Per Tube 4 times daily 02/16/23 0953 02/18/23 1439   02/16/23 0415  micafungin (MYCAMINE) 100 mg in sodium chloride 0.9 % 100 mL IVPB        100 mg 105 mL/hr over 1 Hours Intravenous STAT 02/16/23 0353 02/16/23 0610   02/15/23 1300  vancomycin (VANCOREADY) IVPB 750 mg/150 mL  Status:  Discontinued        750 mg 150 mL/hr over 60 Minutes Intravenous Every 12 hours 02/14/23 1156 02/17/23 0204   02/14/23 1130  vancomycin (VANCOCIN) IVPB 1000 mg/200 mL premix        1,000 mg 200 mL/hr over 60 Minutes Intravenous  Once 02/14/23 1043 02/14/23 1249   02/11/23 2000  cefTRIAXone (ROCEPHIN) 2 g in sodium chloride 0.9 % 100 mL IVPB  Status:  Discontinued       Note to Pharmacy: Pharmacy may adjust dosing strength / duration / interval for maximal efficacy   2 g 200 mL/hr over 30 Minutes Intravenous Every 24 hours 02/11/23 1622 02/28/23 0852   02/10/23 1600  metroNIDAZOLE (FLAGYL) IVPB 500 mg  Status:  Discontinued        500 mg 100 mL/hr over 60 Minutes Intravenous Every 12 hours 02/10/23 1444 02/28/23 0857   02/08/23 1800  vancomycin (VANCOCIN) 50 mg/mL oral solution SOLN 500 mg        500 mg Per Tube Every 6 hours 02/08/23 1325 02/12/23 1759   02/03/23 2200  metroNIDAZOLE (FLAGYL) tablet 500 mg        500 mg Per Tube Every 12 hours 02/03/23 1406 02/06/23 2148   02/02/23 1815  vancomycin (VANCOCIN) 50 mg/mL oral solution SOLN 125 mg  Status:  Discontinued        125 mg Per Tube Every 6 hours 02/02/23 1717 02/08/23 1325   02/02/23 1545  vancomycin (VANCOCIN) capsule 125 mg  Status:  Discontinued        125 mg Oral 4 times daily 02/02/23 1530  02/02/23 1717   01/27/23 1430  cefTRIAXone (ROCEPHIN) 2 g in sodium chloride 0.9 % 100 mL IVPB  2 g 200 mL/hr over 30 Minutes Intravenous Every 24 hours 01/27/23 1340 02/05/23 1403   01/27/23 1400  metroNIDAZOLE (FLAGYL) IVPB 500 mg  Status:  Discontinued        500 mg 100 mL/hr over 60 Minutes Intravenous Every 12 hours 01/27/23 1340 02/03/23 1406   01/27/23 0545  vancomycin (VANCOREADY) IVPB 1250 mg/250 mL        1,250 mg 166.7 mL/hr over 90 Minutes Intravenous  Once 01/27/23 0542 01/27/23 0809   01/27/23 0545  ceFEPIme (MAXIPIME) 2 g in sodium chloride 0.9 % 100 mL IVPB        2 g 200 mL/hr over 30 Minutes Intravenous  Once 01/27/23 0542 01/27/23 0621   01/27/23 0545  metroNIDAZOLE (FLAGYL) IVPB 500 mg        500 mg 100 mL/hr over 60 Minutes Intravenous  Once 01/27/23 0542 01/27/23 0741        I have personally reviewed the following labs and images: CBC: Recent Labs  Lab 03/04/23 0530 03/05/23 0556 03/07/23 0436 03/10/23 0220  WBC 12.4* 10.5 9.8 11.0*  HGB 11.1* 11.3* 10.7* 11.0*  HCT 34.6* 35.4* 34.4* 34.3*  MCV 86.1 85.3 86.4 86.6  PLT 1,268* 1,212* 1,210* 961*   BMP &GFR Recent Labs  Lab 03/04/23 0530 03/05/23 0556 03/07/23 0436 03/10/23 0220  NA 131* 130* 131* 127*  K 4.1 3.6 4.7 3.7  CL 94* 90* 93* 90*  CO2 26 24 27 25   GLUCOSE 106* 113* 87 94  BUN <5* 6 6 10   CREATININE 0.53 0.54 0.57 0.85  CALCIUM 9.7 10.1 10.2 10.2  MG 1.9 1.6* 1.7 1.3*  PHOS  --  4.3 4.6 3.8   Estimated Creatinine Clearance: 63.3 mL/min (by C-G formula based on SCr of 0.85 mg/dL). Liver & Pancreas: Recent Labs  Lab 03/05/23 0556 03/07/23 0436 03/10/23 0220  ALBUMIN 3.0* 3.0* 3.0*   No results for input(s): "LIPASE", "AMYLASE" in the last 168 hours. No results for input(s): "AMMONIA" in the last 168 hours. Diabetic: No results for input(s): "HGBA1C" in the last 72 hours. Recent Labs  Lab 03/07/23 0822 03/07/23 1228 03/07/23 1640 03/07/23 2047 03/08/23 0556   GLUCAP 108* 110* 102* 102* 102*   Cardiac Enzymes: No results for input(s): "CKTOTAL", "CKMB", "CKMBINDEX", "TROPONINI" in the last 168 hours. No results for input(s): "PROBNP" in the last 8760 hours. Coagulation Profile: No results for input(s): "INR", "PROTIME" in the last 168 hours. Thyroid Function Tests: No results for input(s): "TSH", "T4TOTAL", "FREET4", "T3FREE", "THYROIDAB" in the last 72 hours. Lipid Profile: No results for input(s): "CHOL", "HDL", "LDLCALC", "TRIG", "CHOLHDL", "LDLDIRECT" in the last 72 hours. Anemia Panel: No results for input(s): "VITAMINB12", "FOLATE", "FERRITIN", "TIBC", "IRON", "RETICCTPCT" in the last 72 hours. Urine analysis:    Component Value Date/Time   COLORURINE AMBER (A) 01/27/2023 0514   APPEARANCEUR HAZY (A) 01/27/2023 0514   LABSPEC 1.020 01/27/2023 0514   PHURINE 5.0 01/27/2023 0514   GLUCOSEU NEGATIVE 01/27/2023 0514   GLUCOSEU NEG mg/dL 54/01/8118 1478   HGBUR NEGATIVE 01/27/2023 0514   BILIRUBINUR NEGATIVE 01/27/2023 0514   KETONESUR 5 (A) 01/27/2023 0514   PROTEINUR 30 (A) 01/27/2023 0514   UROBILINOGEN 0.2 12/26/2013 1530   NITRITE NEGATIVE 01/27/2023 0514   LEUKOCYTESUR TRACE (A) 01/27/2023 0514   Sepsis Labs: Invalid input(s): "PROCALCITONIN", "LACTICIDVEN"  Microbiology: No results found for this or any previous visit (from the past 240 hour(s)).   Radiology Studies: No results found.    Courtnee Myer T. Britiney Blahnik [facility]  If 7PM-7AM, please contact night-coverage [email or web address] 03/10/2023, 2:01 PM

## 2023-03-10 NOTE — TOC Progression Note (Signed)
Transition of Care [facility]) - Progression Note    Patient Details  Name: [name] MRN: [id number] Date of Birth: [birth year]  [facility]) CM/SW Contact  Mearl Latin, LCSW Phone Number: 03/10/2023, 9:01 AM  Clinical Narrative:    CSW spoke with Whitney with [facility] regarding patient and she stated they had to decline due to not having Medicaid bed availability. No other SNF offers at this time.    Expected Discharge Plan: Skilled Nursing Facility Barriers to Discharge: Continued Medical Work up  Expected Discharge Plan and Services In-house Referral: Clinical Social Work Discharge Planning Services: CM Consult   Living arrangements for the past 2 months: Single Family Home                                       Social Determinants of Health (SDOH) Interventions SDOH Screenings   Food Insecurity: No Food Insecurity (03/05/2023)  Housing: Low Risk  (03/05/2023)  Transportation Needs: No Transportation Needs (03/05/2023)  Utilities: Not At Risk (03/05/2023)  Financial Resource Strain: Medium Risk (10/18/2022)   Received from [facility]  Social Connections: Unknown (09/01/2021)   Received [facility]  Stress: Stress Concern Present (06/25/2020)   Received from [facility], [facility]  Tobacco Use: High Risk (02/18/2023)    Readmission Risk Interventions     No data to display

## 2023-03-11 DIAGNOSIS — A0472 Enterocolitis due to Clostridium difficile, not specified as recurrent: Secondary | ICD-10-CM | POA: Diagnosis not present

## 2023-03-11 DIAGNOSIS — B49 Unspecified mycosis: Secondary | ICD-10-CM | POA: Diagnosis not present

## 2023-03-11 DIAGNOSIS — I469 Cardiac arrest, cause unspecified: Secondary | ICD-10-CM | POA: Diagnosis not present

## 2023-03-11 DIAGNOSIS — D62 Acute posthemorrhagic anemia: Secondary | ICD-10-CM | POA: Diagnosis not present

## 2023-03-11 LAB — RENAL FUNCTION PANEL
Albumin: 3 g/dL — ABNORMAL LOW (ref 3.5–5.0)
Anion gap: 10 (ref 5–15)
BUN: 12 mg/dL (ref 6–20)
CO2: 25 mmol/L (ref 22–32)
Calcium: 10 mg/dL (ref 8.9–10.3)
Chloride: 91 mmol/L — ABNORMAL LOW (ref 98–111)
Creatinine, Ser: 0.86 mg/dL (ref 0.44–1.00)
GFR, Estimated: >60 mL/min (ref >60)
Glucose, Bld: 92 mg/dL (ref 70–99)
Phosphorus: 3.7 mg/dL (ref 2.5–4.6)
Potassium: 4.1 mmol/L (ref 3.5–5.1)
Sodium: 126 mmol/L — ABNORMAL LOW (ref 135–145)

## 2023-03-11 LAB — OSMOLALITY, URINE: Osmolality, Ur: 218 mosm/kg — ABNORMAL LOW (ref 300–900)

## 2023-03-11 LAB — CBC
HCT: 35.5 % — ABNORMAL LOW (ref 36.0–46.0)
Hemoglobin: 11.5 g/dL — ABNORMAL LOW (ref 12.0–15.0)
MCH: 28.5 pg (ref 26.0–34.0)
MCHC: 32.4 g/dL (ref 30.0–36.0)
MCV: 87.9 fL (ref 80.0–100.0)
Platelets: 949 10*3/uL (ref 150–400)
RBC: 4.04 MIL/uL (ref 3.87–5.11)
RDW: 16 % — ABNORMAL HIGH (ref 11.5–15.5)
WBC: 10.8 10*3/uL — ABNORMAL HIGH (ref 4.0–10.5)
nRBC: 0 % (ref 0.0–0.2)

## 2023-03-11 LAB — SODIUM, URINE, RANDOM: Sodium, Ur: <10 mmol/L

## 2023-03-11 LAB — MAGNESIUM: Magnesium: 2 mg/dL (ref 1.7–2.4)

## 2023-03-11 MED ORDER — SODIUM CHLORIDE 0.9 % IV SOLN: INTRAVENOUS | Status: AC

## 2023-03-11 NOTE — Plan of Care (Signed)
  Problem: Education: Goal: Knowledge of General Education information will improve Description Including pain rating scale, medication(s)/side effects and non-pharmacologic comfort measures Outcome: Progressing   

## 2023-03-11 NOTE — TOC Progression Note (Addendum)
Transition of Care [facility]) - Progression Note    Patient Details  Name: [name] MRN: [id number] Date of Birth: [birth year]  Transition of Care [facility]) CM/SW Contact  Mearl Latin, LCSW Phone Number: 03/11/2023, 2:30 PM  Clinical Narrative:    2:30p-CSW met with patient and provided the only SNF bed offer of [facility] due to insurance. CSW is checking with [facility] at patient's request as it would be closer than [place]. Otherwise, patient aware that [facility] is the only option. [facility] able to begin insurance process once determination is made.   2:58 PM-[facility] able to accept patient for short term rehab. They will begin insurance process.    Expected Discharge Plan: Skilled Nursing Facility Barriers to Discharge: Continued Medical Work up, SNF Pending bed offer, English as a second language teacher  Expected Discharge Plan and Services In-house Referral: Clinical Social Work Discharge Planning Services: CM Consult Post Acute Care Choice: Skilled Nursing Facility Living arrangements for the past 2 months: Single Family Home                                       Social Determinants of Health (SDOH) Interventions SDOH Screenings   Food Insecurity: No Food Insecurity (03/05/2023)  Housing: Low Risk  (03/05/2023)  Transportation Needs: No Transportation Needs (03/05/2023)  Utilities: Not At Risk (03/05/2023)  Financial Resource Strain: Medium Risk (10/18/2022)   Received from [facility]  Social Connections: Unknown (09/01/2021)   Received from [facility], [facility]  Stress: Stress Concern Present (06/25/2020)   Received from [facility], [facility]  Tobacco Use: High Risk (02/18/2023)    Readmission Risk Interventions     No data to display

## 2023-03-11 NOTE — Consult Note (Signed)
WOC Nurse ostomy follow up Stoma type/location: RLQ, end ileostomy  Stomal assessment/size: 1" round, skin level, pink Peristomal assessment: hypergranulation at 9 o'clock  Treatment options for stomal/peristomal skin: nursing staff changed yesterday, patient is reporting discomfort at the area of the hypergranulation Output dark, green, thicker  Ostomy pouching: 1pc.convex  Education provided: reminded patient to notify staff when the pouch is 1/3-1/2 full to assist with emptying.  She was not engaged in much education today.  Enrolled patient in [place] Secure Start Discharge program: Yes   Plans to DC to SNF  WOC Nurse will follow along with you for continued support with ostomy teaching and care Florie Carico [facility] MSN, RN, [place], CNS, [place] [phone]

## 2023-03-11 NOTE — TOC Progression Note (Signed)
Transition of Care [facility]) - Progression Note    Patient Details  Name: [name] MRN: [id number] Date of Birth: August 14, 1970  Transition of Care [facility]) CM/SW Contact  Mearl Latin, LCSW Phone Number: 03/11/2023, 9:28 AM  Clinical Narrative:    No SNF bed offers. CSW expanded search.    Expected Discharge Plan: Skilled Nursing Facility Barriers to Discharge: Continued Medical Work up  Expected Discharge Plan and Services In-house Referral: Clinical Social Work Discharge Planning Services: CM Consult   Living arrangements for the past 2 months: Single Family Home                                       Social Determinants of Health (SDOH) Interventions SDOH Screenings   Food Insecurity: No Food Insecurity (03/05/2023)  Housing: Low Risk  (03/05/2023)  Transportation Needs: No Transportation Needs (03/05/2023)  Utilities: Not At Risk (03/05/2023)  Financial Resource Strain: Medium Risk (10/18/2022)   Received from [facility]  Social Connections: Unknown (09/01/2021)   Received from [facility], [facility]  Stress: Stress Concern Present (06/25/2020)   [facility] from [facility], [facility]  Tobacco Use: High Risk (02/18/2023)    Readmission Risk Interventions     No data to display

## 2023-03-11 NOTE — Progress Notes (Signed)
Occupational Therapy Treatment Patient Details Name: [name] MRN: [id number] DOB: 05-03-1971 Today's Date: 03/11/2023   History of present illness 52 y.o. female admitted on 01/27/2023 after being found unresponsive at home down in a pool of bloody vomit/stool. CPR was initiated upon arrival to ED, pt intubated and started on pressors. Pt undergoing management for inflammatory vs infections colitis. Pt extubated on 10/2. 10/10 underwent repeat proctosigmoidoscopy for ongoing diarrhea. Diffuse severe inflammation identified due to combination of Crohn's disease, active C. difficile infection and possible ischemic colitis from prior hypotension on presentation. 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 10/13 became tachycardic and hypotensive.  More lethargic and transferred back to ICU. On 10/15, blood cultures revealed fungemia. On 10/17, CT of abdomen and pelvis notable for septic emboli. Successful Left subclavian CVC placement with IR on 10/21. PMH includes COPD, Crohn's disease, HTN, DMII.   OT comments  OT session focused on training for increased safety and independence with ADLs and bed mobility, functional transfers, and functional mobility during/in preparation for ADLs. Pt currently demonstrates ability to complete UB ADLs with Set up to Contact guard assist, LB ADLs with Contact guard to Mod assist, bed mobility with Mod I to Supervision with increased time, and functional transfers/mobility with RW with Contact guard assist. Pt participated well in session with encouragement and is making progress toward goals. However, pt presents with self-limiting behaviors, such as declining sitting in recliner and participating minimally in OOB activities. Pt will benefit from continued acute skilled OT services to address deficits outlined below and increase safety and independence with functional tasks. Post acute discharge, pt will benefit from intensive inpatient skilled rehab  services < 3 hours per day to maximize rehab potential.       If plan is discharge home, recommend the following:  A little help with walking and/or transfers;A little help with bathing/dressing/bathroom;Assistance with cooking/housework;Direct supervision/assist for medications management;Assist for transportation;Help with stairs or ramp for entrance   Equipment Recommendations  Other (comment) (defer to next level of care)    Recommendations for Other Services      Precautions / Restrictions Precautions Precautions: Fall Precaution Comments: watch HR/RR, L JP drain, new colostomy Restrictions Weight Bearing Restrictions: No       Mobility Bed Mobility Overal bed mobility: Needs Assistance Bed Mobility: Rolling, Supine to Sit, Sit to Supine Rolling: Modified independent (Device/Increase time)   Supine to sit: Supervision, Used rails, HOB elevated Sit to supine: Supervision   General bed mobility comments: assist with lines and drain; with mildly increased time    Transfers Overall transfer level: Needs assistance Equipment used: Rolling walker (2 wheels) Transfers: Sit to/from Stand, Bed to chair/wheelchair/BSC Sit to Stand: Contact guard assist     Step pivot transfers: Contact guard assist (with increased time; with RW)           Balance Overall balance assessment: Needs assistance Sitting-balance support: Feet supported, No upper extremity supported Sitting balance-Leahy Scale: Good     Standing balance support: Single extremity supported, Bilateral upper extremity supported, During functional activity, Reliant on assistive device for balance Standing balance-Leahy Scale: Poor (Poor+/Fair-)                             ADL either performed or assessed with clinical judgement   ADL Overall ADL's : Needs assistance/impaired Eating/Feeding: Set up;Sitting   Grooming: Set up;Sitting   Upper Body Bathing: Contact guard assist;Minimal  assistance;Sitting   Lower  Body Bathing: Minimal assistance;Moderate assistance;Sitting/lateral leans;Sit to/from stand Lower Body Bathing Details (indicate cue type and reason): simulated Upper Body Dressing : Contact guard assist;Sitting   Lower Body Dressing: Contact guard assist;Sitting/lateral leans;Sit to/from stand   Toilet Transfer: Contact guard assist;Rolling walker (2 wheels);Ambulation (with increased time) Toilet Transfer Details (indicate cue type and reason): simulated         Functional mobility during ADLs: Contact guard assist;Rolling walker (2 wheels) (CGA for safety) General ADL Comments: Pt continues to present with decreased activity tolerance. Pt with self-limiting behaviors, often declining sitting in recliner and with minimal OOB participation during sessions.    Extremity/Trunk Assessment Upper Extremity Assessment Upper Extremity Assessment: Right hand dominant;Generalized weakness   Lower Extremity Assessment Lower Extremity Assessment: Defer to PT evaluation        Vision       Perception     Praxis      Cognition Arousal: Alert Behavior During Therapy: [facility] for tasks assessed/performed Overall Cognitive Status: Within Functional Limits for tasks assessed                                 General Comments: Pt AAOx4 and pleasant throughout.        Exercises      Shoulder Instructions       General Comments Pt reports mild, brief dizziness in standing/ with all VSS on RA throughout session.    Pertinent Vitals/ Pain       Pain Assessment Pain Assessment: Faces Faces Pain Scale: Hurts little more Pain Location: L side of abdomen Pain Descriptors / Indicators: Guarding, Discomfort, Grimacing Pain Intervention(s): Limited activity within patient's tolerance, Monitored during session, Repositioned, Premedicated before session  Home Living                                          Prior  Functioning/Environment              Frequency  Min 1X/week        Progress Toward Goals  OT Goals(current goals can now be found in the care plan section)  Progress towards OT goals: Progressing toward goals  Acute Rehab OT Goals Patient Stated Goal: to feel better and return home  Plan      Co-evaluation                 AM-PAC OT "6 Clicks" Daily Activity     Outcome Measure   Help from another person eating meals?: A Little Help from another person taking care of personal grooming?: A Little Help from another person toileting, which includes using toliet, bedpan, or urinal?: Total Help from another person bathing (including washing, rinsing, drying)?: A Lot Help from another person to put on and taking off regular upper body clothing?: A Little Help from another person to put on and taking off regular lower body clothing?: A Little 6 Click Score: 15    End of Session Equipment Utilized During Treatment: Rolling walker (2 wheels)  OT Visit Diagnosis: Muscle weakness (generalized) (M62.81);Other (comment);Unsteadiness on feet (R26.81) (decreased activity tolerance)   Activity Tolerance Patient tolerated treatment well;Other (comment) (Pt with self-limiting behaviors/)   Patient Left in bed;with call bell/phone within reach;with bed alarm set   Nurse Communication Mobility status        Time: 8295-6213 OT  Time Calculation (min): 38 min  Charges: OT General Charges $OT Visit: 1 Visit OT Treatments $Self Care/Home Management : 38-52 mins  Lyrik Buresh "Orson Eva., OTR/L, MA Acute Rehab [phone]   Lendon Colonel 03/11/2023, 2:18 PM

## 2023-03-11 NOTE — Progress Notes (Signed)
Physical Therapy Treatment Patient Details Name: [name] MRN: [id number] DOB: July 21, 1970 Today's Date: 03/11/2023   History of Present Illness 52 y.o. female admitted on 01/27/2023 after being found unresponsive at home down in a pool of bloody vomit/stool. CPR was initiated upon arrival to ED, pt intubated and started on pressors. Pt undergoing management for inflammatory vs infections colitis. Pt extubated on 10/2. 10/10 underwent repeat proctosigmoidoscopy for ongoing diarrhea. Diffuse severe inflammation identified due to combination of Crohn's disease, active C. difficile infection and possible ischemic colitis from prior hypotension on presentation. 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 10/13 became tachycardic and hypotensive.  More lethargic and transferred back to ICU. On 10/15, blood cultures revealed fungemia. On 10/17, CT of abdomen and pelvis notable for septic emboli. Successful Left subclavian CVC placement with IR on 10/21. PMH includes COPD, Crohn's disease, HTN, DMII.    PT Comments  Patient participating well in in bed strengthening activities this pm.  Was fatigued from up with OT and mobility earlier today.   Encouraged esp core strengthening activities on her own and she was engaged and reports already doing some.  Feel she remains appropriate for inpatient rehab (<3 hours/day) prior to d/c home.    If plan is discharge home, recommend the following: Assistance with cooking/housework;Assist for transportation;Direct supervision/assist for financial management;Help with stairs or ramp for entrance;A little help with bathing/dressing/bathroom;A little help with walking and/or transfers   Can travel by private vehicle     No  Equipment Recommendations  Rolling walker (2 wheels)    Recommendations for Other Services       Precautions / Restrictions Precautions Precautions: Fall Precaution Comments: watch HR/RR, L JP drain, new  colostomy Restrictions Weight Bearing Restrictions: No     Mobility  Bed Mobility               General bed mobility comments: deferred due to pain/fatigue up with mobility and OT earlier today    Transfers                        Ambulation/Gait                   Stairs             Wheelchair Mobility     Tilt Bed    Modified Rankin (Stroke Patients Only)       Balance                                            Cognition Arousal: Alert Behavior During Therapy: WFL for tasks assessed/performed Overall Cognitive Status: Within Functional Limits for tasks assessed                                          Exercises General Exercises - Upper Extremity Shoulder Flexion: AROM, Both, Strengthening, Supine, 10 reps (holding Shasta cans) Elbow Flexion: AROM, Both, Other reps (comment), Seated, Supine, 10 reps (holding shasta cans) General Exercises - Lower Extremity Ankle Circles/Pumps: AROM, 10 reps, Supine, Both Short Arc Quad: AROM, 10 reps, Both, Supine Heel Slides: AROM, Both, 5 reps, Supine Hip ABduction/ADduction: Strengthening, Both, 10 reps, Supine Other Exercises Other Exercises: scap squeezes x 10 w/ 5 sec hold Other Exercises: hip  adductor squeezes x 10 w/ 5 sec hold    General Comments General comments (skin integrity, edema, etc.): Pt reports mild, brief dizziness in standing/ with all VSS on RA throughout session.      Pertinent Vitals/Pain Pain Assessment Faces Pain Scale: Hurts even more Pain Location: R side of abdomen Pain Descriptors / Indicators: Guarding, Discomfort, Grimacing Pain Intervention(s): Monitored during session    Home Living                          Prior Function            PT Goals (current goals can now be found in the care plan section) Progress towards PT goals: Progressing toward goals    Frequency    Min 1X/week      PT Plan       Co-evaluation              AM-PAC PT "6 Clicks" Mobility   Outcome Measure  Help needed turning from your back to your side while in a flat bed without using bedrails?: A Little Help needed moving from lying on your back to sitting on the side of a flat bed without using bedrails?: A Little Help needed moving to and from a bed to a chair (including a wheelchair)?: A Little Help needed standing up from a chair using your arms (e.g., wheelchair or bedside chair)?: A Little Help needed to walk in hospital room?: A Little Help needed climbing 3-5 steps with a railing? : Total 6 Click Score: 16    End of Session   Activity Tolerance: Patient limited by fatigue Patient left: in bed;with call bell/phone within reach   PT Visit Diagnosis: Other abnormalities of gait and mobility (R26.89);Muscle weakness (generalized) (M62.81)     Time: 1610-9604 PT Time Calculation (min) (ACUTE ONLY): 24 min  Charges:    $Therapeutic Exercise: 8-22 mins $Self Care/Home Management: 8-22                       Sheran Lawless, PT Acute Rehabilitation Services [phone] 03/11/2023    Elray Mcgregor 03/11/2023, 5:15 PM

## 2023-03-11 NOTE — Progress Notes (Signed)
PROGRESS NOTE  [name]:[id number] DOB: [birth year]   PCP: [name], PA-C  Patient is from: Home  DOA: 01/27/2023 LOS: 43  Chief complaints Chief Complaint  Patient presents with   Respiratory Arrest     Brief Narrative / Interim history: 52 year old female with past medical history of COPD, Crohn's on Skyrizi, hypertension, type 2 diabetes mellitus who initially presented to the after being found unresponsive. Per family, patient has had bloody emesis with diarrhea over the past few days. Initial systolics in the 60s in the ED, and patient was minimally responsive. In the emergency room, patient was intubated and admitted to the ICU for septic shock and encephalopathy on 01/27/2023.  For further evaluation and management for shock and altered mental status.   Further workup revealed C. difficile colitis, intra-abdominal infection with abscess and fungemia.  ID consulted and started on prolonged p.o. vancomycin with taper and micafungin in addition to IV antibiotics for intra-abdominal abscess.  Eventually, she came off vasopressors on 9/29 and extubated on 10/2 and transferred to floor on 10/3.   Patient with ongoing diarrhea and had repeat proctosigmoidoscopy that showed severe inflammation likely from underlying Crohn's disease, active C. difficile infection and possible ischemic colitis from prior hypotension on presentation.  She underwent ex lap with total total colectomy and ileostomy on 10/11.  Intraoperatively, found to have frank necrosis with pelvic abscess formation and significant colitis. Unfortunately, she became tachycardic with hypotension and lethargy and transferred back to ICU on 10/13.  Blood culture on 10/15 grew Candida glabrata for which she was started on micafungin.  CT abdomen and pelvis on 10/16 concerning for incidental small PE to RLL pulmonary artery branch raising concern for septic emboli.  She underwent TEE on 10/18 that was negative for  endocarditis.  Patient completed course for antibiotics and p.o. vancomycin.  Remains on micafungin for fungemia for a total of 6 weeks from 10/16.  ID following intermittently.  In regards to abdominal wound, wound VAC removed.  Surgery recommended wet-to-dry dressing.  Surgery following weekly.  Therapy recommended CIR but turned it down by CIR due lack of 24/7 supervision and need for prolonged rehabilitation.  Plan is discharge to SNF once tachycardia and diarrhea fairly controlled.  We have been titrating metoprolol.  Added ivabradine on 11/6.  Weaning of IV pain medication.    Subjective: Seen and examined earlier this morning.  No major events overnight of this morning.  Reports improvement in pain.  Objective: Vitals:   03/11/23 0332 03/11/23 0834 03/11/23 0845 03/11/23 1208  BP: (!) 113/42  131/68 (!) 102/58  Pulse: 83  (!) 103 87  Resp: 15  18 16   Temp: 97.8 F (36.6 C)  98.2 F (36.8 C) 98.1 F (36.7 C)  TempSrc: Oral  Oral Oral  SpO2: 96% 96% 100% 98%  Weight:      Height:        Examination:  GENERAL: No apparent distress.  Nontoxic. HEENT: MMM.  Vision and hearing grossly intact.  No tenderness with palpation over temporal regions. NECK: Supple.  No apparent JVD.  RESP:  No IWOB.  Fair aeration bilaterally. CVS: HR in 90s.  Regular rhythm.  Heart sounds normal.  ABD/GI/GU: BS+.  Laparotomy wound.  No signs of infection.  Ileostomy bag with liquid stool.  JP drain MSK/EXT:  Moves extremities. No apparent deformity. No edema.  SKIN: no apparent skin lesion or wound NEURO: Awake, alert and oriented appropriately.  No apparent focal neuro deficit. PSYCH: Calm. Normal  affect.    Microbiology summarized: 9/26-COVID-19, influenza and RSV PCR nonreactive 9/26 and 10/13-MRSA PCR screen negative 9/26-blood culture with staph hominis in 1 out of 2 bottles likely contaminant 9/29-C. difficile PCR positive 9/29-GIP negative 10/12-blood culture with Candida glabrata in 1  out of 2 bottles 10/17-blood cultures NGTD 10/27-pleural fluid culture NGTD  Assessment and plan: Septic shock, multifactorial-including C. difficile colitis, fungemia, intra-abdominal abscess Fungemia, candida glabrata, likely POA Cdiff Colitis: POA.  Completed treatment course. Septic emboli, thrombocytosis Intra-abdominal infection/abscess -Culture data as above. -S/p laparotomy with total colectomy with ileostomy and LLQ JP drain -Received ceftriaxone and Flagyl for extended course until 10/28. -TTE/TEE negative for vegetation.  Bubble study negative. -Appreciate input by ID -Continue antifungal for 6 weeks for fungemia.  Started on 10/16>>>. -Complete 14 days of p.o. vancomycin followed by taper for C. Difficile-completed. -Ophthalmology or neuro consultation.  -Evaluated by ophthalmology on 10/23.  No evidence of fungal infiltrate on funduscopic evaluation -Left subclavian tunneled CVC placed 02/21/2023 with IR -Leukocytosis resolved.  Still with diarrhea probably due to colectomy. -Wound care per general surgery and wound care staff.  WTD daily -Continue fentanyl patch.  Increased p.o. Dilaudid to 4 mg every 4 hours as needed on 11/6 -Decreased IV Dilaudid to 0.5 mg every 6 hours as needed severe pain on 11/6 -Patient has been encouraged to minimize IV Dilaudid.   Intractable abdominal pain: Likely due to the above. Left-sided chest pain: EKG and troponin negative.  Chest pain resolved.  Left-sided headache-Attributing to oxycodone.  Oxycodone stopped.  Resolved. Nausea without emesis -Pain regimen as above. -Continue home amitriptyline, gabapentin and Cymbalta -Add Compazine for refractory nausea and vomiting  Moderate to severe malnutrition Postoperative short bowel syndrome/high output ostomy Malabsorption likely acute on chronic -Increased Lomotil to 4 times daily on 11/4 -Continue Imodium 4 mg 4 times daily -Encourage oral intake. -Optimize nutrition -Not a  candidate for TPN due to fungemia  Sinus tachycardia: Likely due to acute illness and possible dehydration.  TSH normal.  He had some pericardial effusion without tamponade.  Repeat CT on 10/25 did not reveal pericardial effusion.  HR improved after adding ivabradine -Increased metoprolol to 62.5 mg twice daily on 11/3. -Added ivabradine 5 mg twice daily on 11/6 -May consider repeat echocardiogram if no improvement   Left-sided pleural effusion/iatrogenic pneumothorax, small: S/p thoracocentesis on 10/27.  Fluid is exudative by LDH.  Fluid culture NGTD.  Subsequent chest x-ray on 10/28 without pneumothorax.  Cytology negative. 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  Previous provider discussed with Dr. Cardell Peach with urology who recommended outpatient evaluation with cystoscopy  Acute pulmonary emboli/right upper extremity DVT -Continue Lovenox   Thrombocytosis: Platelet about 1200k. Evaluated by hematology and felt to be inflammatory response.  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   -Monitor intermittently -Outpatient follow-up with hematology   Longstanding Crohn's disease: Not chronically on steroids so less concern for adrenal insufficiency. S/p colectomy -Steroids/Skyrizi held in the setting of infection.  Pericardial effusion without tamponade: Initially seen on TTE.  Was not seen on CTA on 10/25  -May need outpatient echocardiogram to confirm resolution.  Cardiac arrest secondary to septic shock requiring CPR on admission   Controlled DM-2 with hyperglycemia: A1c 5.8% on 9/26.  CBG consistently in low 100s.  -Discontinued SSI and CBG monitoring   History of seizures -Continue PTA keppra/gabapentin   History of anxiety -Resume home Xanax, Cymbalta, hydroxyzine   History of  hypertension -Metoprolol as above   History of COPD: Stable -Continue Yupelri daily and xopenex PRN   GERD -Continue  PTA PPI   Normocytic Anemia: Stable -Transfuse for Hb <7 or hemodynamically significant bleeding  Hypokalemia/hypomagnesemia -Monitor replenish as appropriate  Hyponatremia: Patient euvolemic but a lot of ostomy output.  Urine sodium <10 suggesting hypovolemia. -Trial of IV NS for 24-hour.   Inadequate oral intake/weight loss: Lost about 30 pounds so far. Body mass index is 23.28 kg/m. Nutrition Problem: Inadequate oral intake Etiology: inability to eat Signs/Symptoms: NPO status Interventions: Refer to RD note for recommendations   DVT prophylaxis:  On full dose anticoagulation.  Code Status: Full code Family Communication: None at bedside Level of care: Telemetry Cardiac Status is: Inpatient Remains inpatient appropriate because: Abdominal pain, tachycardia, high ostomy output   Final disposition: SNF in the next 24 to 48 hours. Consultants:  Pulmonology General Surgery Hematology Urology Infectious disease Ophthalmology   35 minutes with more than 50% spent in reviewing records, counseling patient/family and coordinating care.   Sch Meds:  Scheduled Meds:  acetaminophen  650 mg Oral Q6H WA   amitriptyline  25 mg Oral QHS   Chlorhexidine Gluconate Cloth  6 each Topical Daily   diphenoxylate-atropine  1 tablet Oral QID   DULoxetine  30 mg Oral Daily   enoxaparin (LOVENOX) injection  60 mg Subcutaneous Q12H   feeding supplement  237 mL Oral TID BM   fentaNYL  1 patch Transdermal Q72H   folic acid  1 mg Oral Daily   gabapentin  100 mg Oral TID   Gerhardt's butt cream   Topical BID   ivabradine  5 mg Oral BID WC   levETIRAcetam  750 mg Oral BID   lidocaine  2 patch Transdermal Q24H   lidocaine (PF)  8 mL Intradermal Once   loperamide  4 mg Oral QID   loratadine  10 mg Oral Daily   methocarbamol  1,000 mg Oral TID   metoprolol tartrate  62.5 mg Oral BID   multivitamin with minerals  1 tablet Oral Daily   pantoprazole  40 mg Oral Daily   revefenacin  175  mcg Nebulization Daily   sodium chloride flush  10-40 mL Intracatheter Q12H   thiamine  100 mg Oral Daily   vancomycin  125 mg Oral BID   Followed by   Melene Muller ON 03/13/2023] vancomycin  125 mg Oral Daily   Followed by   Melene Muller ON 03/20/2023] vancomycin  125 mg Oral QODAY   Followed by   Melene Muller ON 03/28/2023] vancomycin  125 mg Oral Q3 days   Continuous Infusions:  micafungin (MYCAMINE) 150 mg in sodium chloride 0.9 % 100 mL IVPB 150 mg (03/11/23 1035)   PRN Meds:.ALPRAZolam, bismuth subsalicylate, camphor-menthol, dextrose, HYDROmorphone (DILAUDID) injection, HYDROmorphone, levalbuterol, magic mouthwash, metoprolol tartrate, ondansetron (ZOFRAN) IV, mouth rinse, prochlorperazine, sodium chloride flush  Antimicrobials: Anti-infectives (From admission, onward)    Start     Dose/Rate Route Frequency Ordered Stop   03/28/23 1000  vancomycin (VANCOCIN) capsule 125 mg       Placed in "Followed by" Linked Group   125 mg Oral Every 3 DAYS 03/01/23 0750 04/06/23 0959   03/20/23 1000  vancomycin (VANCOCIN) capsule 125 mg       Placed in "Followed by" Linked Group   125 mg Oral Every other day 03/01/23 0750 03/28/23 0959   03/13/23 1000  vancomycin (VANCOCIN) capsule 125 mg       Placed in "Followed by"  Linked Group   125 mg Oral Daily 03/01/23 0750 03/20/23 0959   03/06/23 1000  vancomycin (VANCOCIN) capsule 125 mg       Placed in "Followed by" Linked Group   125 mg Oral 2 times daily 03/01/23 0750 03/13/23 0959   03/01/23 1000  vancomycin (VANCOCIN) capsule 125 mg       Placed in "Followed by" Linked Group   125 mg Oral 4 times daily 03/01/23 0750 03/05/23 2134   02/18/23 1800  vancomycin (VANCOCIN) 50 mg/mL oral solution SOLN 125 mg        125 mg Oral 4 times daily 02/18/23 1439 02/26/23 0959   02/18/23 0400  micafungin (MYCAMINE) 150 mg in sodium chloride 0.9 % 100 mL IVPB        150 mg 107.5 mL/hr over 1 Hours Intravenous Every 24 hours 02/17/23 0936 03/30/23 2359   02/17/23 0900   vancomycin (VANCOREADY) IVPB 750 mg/150 mL  Status:  Discontinued        750 mg 150 mL/hr over 60 Minutes Intravenous Every 8 hours 02/17/23 0204 02/17/23 0947   02/17/23 0400  micafungin (MYCAMINE) 100 mg in sodium chloride 0.9 % 100 mL IVPB  Status:  Discontinued        100 mg 105 mL/hr over 1 Hours Intravenous Every 24 hours 02/16/23 0353 02/17/23 0936   02/16/23 1045  vancomycin (VANCOCIN) 50 mg/mL oral solution SOLN 125 mg  Status:  Discontinued        125 mg Per Tube 4 times daily 02/16/23 0953 02/18/23 1439   02/16/23 0415  micafungin (MYCAMINE) 100 mg in sodium chloride 0.9 % 100 mL IVPB        100 mg 105 mL/hr over 1 Hours Intravenous STAT 02/16/23 0353 02/16/23 0610   02/15/23 1300  vancomycin (VANCOREADY) IVPB 750 mg/150 mL  Status:  Discontinued        750 mg 150 mL/hr over 60 Minutes Intravenous Every 12 hours 02/14/23 1156 02/17/23 0204   02/14/23 1130  vancomycin (VANCOCIN) IVPB 1000 mg/200 mL premix        1,000 mg 200 mL/hr over 60 Minutes Intravenous  Once 02/14/23 1043 02/14/23 1249   02/11/23 2000  cefTRIAXone (ROCEPHIN) 2 g in sodium chloride 0.9 % 100 mL IVPB  Status:  Discontinued       Note to Pharmacy: Pharmacy may adjust dosing strength / duration / interval for maximal efficacy   2 g 200 mL/hr over 30 Minutes Intravenous Every 24 hours 02/11/23 1622 02/28/23 0852   02/10/23 1600  metroNIDAZOLE (FLAGYL) IVPB 500 mg  Status:  Discontinued        500 mg 100 mL/hr over 60 Minutes Intravenous Every 12 hours 02/10/23 1444 02/28/23 0857   02/08/23 1800  vancomycin (VANCOCIN) 50 mg/mL oral solution SOLN 500 mg        500 mg Per Tube Every 6 hours 02/08/23 1325 02/12/23 1759   02/03/23 2200  metroNIDAZOLE (FLAGYL) tablet 500 mg        500 mg Per Tube Every 12 hours 02/03/23 1406 02/06/23 2148   02/02/23 1815  vancomycin (VANCOCIN) 50 mg/mL oral solution SOLN 125 mg  Status:  Discontinued        125 mg Per Tube Every 6 hours 02/02/23 1717 02/08/23 1325   02/02/23 1545   vancomycin (VANCOCIN) capsule 125 mg  Status:  Discontinued        125 mg Oral 4 times daily 02/02/23 1530 02/02/23 1717   01/27/23  1430  cefTRIAXone (ROCEPHIN) 2 g in sodium chloride 0.9 % 100 mL IVPB        2 g 200 mL/hr over 30 Minutes Intravenous Every 24 hours 01/27/23 1340 02/05/23 1403   01/27/23 1400  metroNIDAZOLE (FLAGYL) IVPB 500 mg  Status:  Discontinued        500 mg 100 mL/hr over 60 Minutes Intravenous Every 12 hours 01/27/23 1340 02/03/23 1406   01/27/23 0545  vancomycin (VANCOREADY) IVPB 1250 mg/250 mL        1,250 mg 166.7 mL/hr over 90 Minutes Intravenous  Once 01/27/23 0542 01/27/23 0809   01/27/23 0545  ceFEPIme (MAXIPIME) 2 g in sodium chloride 0.9 % 100 mL IVPB        2 g 200 mL/hr over 30 Minutes Intravenous  Once 01/27/23 0542 01/27/23 0621   01/27/23 0545  metroNIDAZOLE (FLAGYL) IVPB 500 mg        500 mg 100 mL/hr over 60 Minutes Intravenous  Once 01/27/23 0542 01/27/23 0741        I have personally reviewed the following labs and images: CBC: Recent Labs  Lab 03/05/23 0556 03/07/23 0436 03/10/23 0220 03/11/23 0555  WBC 10.5 9.8 11.0* 10.8*  HGB 11.3* 10.7* 11.0* 11.5*  HCT 35.4* 34.4* 34.3* 35.5*  MCV 85.3 86.4 86.6 87.9  PLT 1,212* 1,210* 961* 949*   BMP &GFR Recent Labs  Lab 03/05/23 0556 03/07/23 0436 03/10/23 0220 03/11/23 0555  NA 130* 131* 127* 126*  K 3.6 4.7 3.7 4.1  CL 90* 93* 90* 91*  CO2 24 27 25 25   GLUCOSE 113* 87 94 92  BUN 6 6 10 12   CREATININE 0.54 0.57 0.85 0.86  CALCIUM 10.1 10.2 10.2 10.0  MG 1.6* 1.7 1.3* 2.0  PHOS 4.3 4.6 3.8 3.7   Estimated Creatinine Clearance: 62.6 mL/min (by C-G formula based on SCr of 0.86 mg/dL). Liver & Pancreas: Recent Labs  Lab 03/05/23 0556 03/07/23 0436 03/10/23 0220 03/11/23 0555  ALBUMIN 3.0* 3.0* 3.0* 3.0*   No results for input(s): "LIPASE", "AMYLASE" in the last 168 hours. No results for input(s): "AMMONIA" in the last 168 hours. Diabetic: No results for input(s):  "HGBA1C" in the last 72 hours. Recent Labs  Lab 03/07/23 0822 03/07/23 1228 03/07/23 1640 03/07/23 2047 03/08/23 0556  GLUCAP 108* 110* 102* 102* 102*   Cardiac Enzymes: No results for input(s): "CKTOTAL", "CKMB", "CKMBINDEX", "TROPONINI" in the last 168 hours. No results for input(s): "PROBNP" in the last 8760 hours. Coagulation Profile: No results for input(s): "INR", "PROTIME" in the last 168 hours. Thyroid Function Tests: No results for input(s): "TSH", "T4TOTAL", "FREET4", "T3FREE", "THYROIDAB" in the last 72 hours. Lipid Profile: No results for input(s): "CHOL", "HDL", "LDLCALC", "TRIG", "CHOLHDL", "LDLDIRECT" in the last 72 hours. Anemia Panel: No results for input(s): "VITAMINB12", "FOLATE", "FERRITIN", "TIBC", "IRON", "RETICCTPCT" in the last 72 hours. Urine analysis:    Component Value Date/Time   COLORURINE AMBER (A) 01/27/2023 0514   APPEARANCEUR HAZY (A) 01/27/2023 0514   LABSPEC 1.020 01/27/2023 0514   PHURINE 5.0 01/27/2023 0514   GLUCOSEU NEGATIVE 01/27/2023 0514   GLUCOSEU NEG mg/dL 95/62/1308 6578   HGBUR NEGATIVE 01/27/2023 0514   BILIRUBINUR NEGATIVE 01/27/2023 0514   KETONESUR 5 (A) 01/27/2023 0514   PROTEINUR 30 (A) 01/27/2023 0514   UROBILINOGEN 0.2 12/26/2013 1530   NITRITE NEGATIVE 01/27/2023 0514   LEUKOCYTESUR TRACE (A) 01/27/2023 0514   Sepsis Labs: Invalid input(s): "PROCALCITONIN", "LACTICIDVEN"  Microbiology: No results found for this or  any previous visit (from the past 240 hour(s)).   Radiology Studies: No results found.    [name] T. Corderius Saraceni [facility]  If 7PM-7AM, please contact night-coverage [email or web address] 03/11/2023, 2:07 PM

## 2023-03-12 DIAGNOSIS — B49 Unspecified mycosis: Secondary | ICD-10-CM | POA: Diagnosis not present

## 2023-03-12 LAB — RENAL FUNCTION PANEL
Albumin: 2.7 g/dL — ABNORMAL LOW (ref 3.5–5.0)
Anion gap: 8 (ref 5–15)
BUN: 8 mg/dL (ref 6–20)
CO2: 23 mmol/L (ref 22–32)
Calcium: 9.2 mg/dL (ref 8.9–10.3)
Chloride: 101 mmol/L (ref 98–111)
Creatinine, Ser: 0.7 mg/dL (ref 0.44–1.00)
GFR, Estimated: >60 mL/min (ref >60)
Glucose, Bld: 93 mg/dL (ref 70–99)
Phosphorus: 3.5 mg/dL (ref 2.5–4.6)
Potassium: 3.8 mmol/L (ref 3.5–5.1)
Sodium: 132 mmol/L — ABNORMAL LOW (ref 135–145)

## 2023-03-12 LAB — CBC
HCT: 32 % — ABNORMAL LOW (ref 36.0–46.0)
Hemoglobin: 9.9 g/dL — ABNORMAL LOW (ref 12.0–15.0)
MCH: 27.1 pg (ref 26.0–34.0)
MCHC: 30.9 g/dL (ref 30.0–36.0)
MCV: 87.7 fL (ref 80.0–100.0)
Platelets: 873 10*3/uL — ABNORMAL HIGH (ref 150–400)
RBC: 3.65 MIL/uL — ABNORMAL LOW (ref 3.87–5.11)
RDW: 16.4 % — ABNORMAL HIGH (ref 11.5–15.5)
WBC: 9.5 10*3/uL (ref 4.0–10.5)
nRBC: 0 % (ref 0.0–0.2)

## 2023-03-12 LAB — MAGNESIUM: Magnesium: 1.5 mg/dL — ABNORMAL LOW (ref 1.7–2.4)

## 2023-03-12 MED ORDER — MAGNESIUM SULFATE 2 GM/50ML IV SOLN
2.0000 g | Freq: Once | INTRAVENOUS | Status: AC
  Administered 2023-03-12: 2 g via INTRAVENOUS
  Filled 2023-03-12: qty 50

## 2023-03-12 NOTE — Plan of Care (Signed)
  Problem: Education: Goal: Knowledge of General Education information will improve Description: Including pain rating scale, medication(s)/side effects and non-pharmacologic comfort measures Outcome: Progressing   Problem: Health Behavior/Discharge Planning: Goal: Ability to manage health-related needs will improve Outcome: Progressing   Problem: Clinical Measurements: Goal: Ability to maintain clinical measurements within normal limits will improve Outcome: Progressing Goal: Will remain free from infection Outcome: Progressing Goal: Diagnostic test results will improve Outcome: Progressing Goal: Respiratory complications will improve Outcome: Progressing Goal: Cardiovascular complication will be avoided Outcome: Progressing   Problem: Activity: Goal: Risk for activity intolerance will decrease Outcome: Progressing   Problem: Nutrition: Goal: Adequate nutrition will be maintained Outcome: Progressing   Problem: Coping: Goal: Level of anxiety will decrease Outcome: Progressing   Problem: Elimination: Goal: Will not experience complications related to bowel motility Outcome: Progressing Goal: Will not experience complications related to urinary retention Outcome: Progressing   Problem: Pain Managment: Goal: General experience of comfort will improve Outcome: Progressing   Problem: Skin Integrity: Goal: Risk for impaired skin integrity will decrease Outcome: Progressing   Problem: Education: Goal: Ability to describe self-care measures that may prevent or decrease complications (Diabetes Survival Skills Education) will improve Outcome: Progressing Goal: Individualized Educational Video(s) Outcome: Progressing   Problem: Fluid Volume: Goal: Ability to maintain a balanced intake and output will improve Outcome: Progressing   Problem: Health Behavior/Discharge Planning: Goal: Ability to identify and utilize available resources and services will improve Outcome:  Progressing

## 2023-03-12 NOTE — Progress Notes (Addendum)
Mobility Specialist Progress Note:   03/12/23 1103  Mobility  Activity Ambulated with assistance in room  Level of Assistance Standby assist, set-up cues, supervision of patient - no hands on  Assistive Device None;Front wheel walker  Distance Ambulated (ft) 60 ft  Activity Response Tolerated well  Mobility Referral Yes  $Mobility charge 1 Mobility  Mobility Specialist Start Time (ACUTE ONLY) 1044  Mobility Specialist Stop Time (ACUTE ONLY) 1100  Mobility Specialist Time Calculation (min) (ACUTE ONLY) 16 min   Pre Mobility: 103 HR  During Mobility: 123 HR  Post Mobility: 95 HR  Pt received in bed, agreeable to mobility. Pt still not agreeable to hallway ambulation but pt able to ambulate in room w/o RW. VSS throughout. Pt denied any dizziness or discomfort during ambulation. Pt returned to bed with call bell in reach and all needs met.  Leory Plowman  Mobility Specialist Please contact via [employer] office at [phone]

## 2023-03-13 DIAGNOSIS — B49 Unspecified mycosis: Secondary | ICD-10-CM | POA: Diagnosis not present

## 2023-03-13 LAB — CBC WITH DIFFERENTIAL/PLATELET
Abs Immature Granulocytes: 0.06 10*3/uL (ref 0.00–0.07)
Basophils Absolute: 0.1 10*3/uL (ref 0.0–0.1)
Basophils Relative: 1 %
Eosinophils Absolute: 0.3 10*3/uL (ref 0.0–0.5)
Eosinophils Relative: 3 %
HCT: 33 % — ABNORMAL LOW (ref 36.0–46.0)
Hemoglobin: 10.6 g/dL — ABNORMAL LOW (ref 12.0–15.0)
Immature Granulocytes: 1 %
Lymphocytes Relative: 19 %
Lymphs Abs: 2.1 10*3/uL (ref 0.7–4.0)
MCH: 28.3 pg (ref 26.0–34.0)
MCHC: 32.1 g/dL (ref 30.0–36.0)
MCV: 88 fL (ref 80.0–100.0)
Monocytes Absolute: 0.6 10*3/uL (ref 0.1–1.0)
Monocytes Relative: 5 %
Neutro Abs: 8.2 10*3/uL — ABNORMAL HIGH (ref 1.7–7.7)
Neutrophils Relative %: 71 %
Platelets: 775 10*3/uL — ABNORMAL HIGH (ref 150–400)
RBC: 3.75 MIL/uL — ABNORMAL LOW (ref 3.87–5.11)
RDW: 16.7 % — ABNORMAL HIGH (ref 11.5–15.5)
WBC: 11.3 10*3/uL — ABNORMAL HIGH (ref 4.0–10.5)
nRBC: 0 % (ref 0.0–0.2)

## 2023-03-13 LAB — GLUCOSE, CAPILLARY
Glucose-Capillary: 95 mg/dL (ref 70–99)
Glucose-Capillary: 140 mg/dL — ABNORMAL HIGH (ref 70–99)

## 2023-03-13 LAB — MAGNESIUM: Magnesium: 1.5 mg/dL — ABNORMAL LOW (ref 1.7–2.4)

## 2023-03-13 MED ORDER — HYDROMORPHONE HCL 1 MG/ML IJ SOLN
0.5000 mg | INTRAMUSCULAR | Status: AC | PRN
Start: 2023-03-13 — End: 2023-03-14
  Administered 2023-03-13 – 2023-03-14 (×3): 0.5 mg via INTRAVENOUS
  Filled 2023-03-13 (×4): qty 0.5

## 2023-03-13 NOTE — Plan of Care (Signed)
  Problem: Education: Goal: Knowledge of General Education information will improve Description: Including pain rating scale, medication(s)/side effects and non-pharmacologic comfort measures Outcome: Progressing   Problem: Health Behavior/Discharge Planning: Goal: Ability to manage health-related needs will improve Outcome: Progressing   Problem: Nutrition: Goal: Adequate nutrition will be maintained Outcome: Progressing   Problem: Elimination: Goal: Will not experience complications related to bowel motility Outcome: Progressing Goal: Will not experience complications related to urinary retention Outcome: Progressing

## 2023-03-13 NOTE — Plan of Care (Signed)

## 2023-03-13 NOTE — Progress Notes (Addendum)
PROGRESS NOTE  [name] ZOX:[id number] DOB: 10-11-1970   PCP: [name], PA-C  Patient is from: Home  DOA: 01/27/2023 LOS: 45  Chief complaints Chief Complaint  Patient presents with   Respiratory Arrest     Brief Narrative / Interim history: 52 year old female with past medical history of COPD, Crohn's on Skyrizi, hypertension, type 2 diabetes mellitus who initially presented to the after being found unresponsive. Per family, patient has had bloody emesis with diarrhea over the past few days. Initial systolics in the 60s in the ED, and patient was minimally responsive. In the emergency room, patient was intubated and admitted to the ICU for septic shock and encephalopathy on 01/27/2023.  For further evaluation and management for shock and altered mental status.   Further workup revealed C. difficile colitis, intra-abdominal infection with abscess and fungemia.  ID consulted and started on prolonged p.o. vancomycin with taper and micafungin in addition to IV antibiotics for intra-abdominal abscess.  Eventually, she came off vasopressors on 9/29 and extubated on 10/2 and transferred to floor on 10/3.   Patient with ongoing diarrhea and had repeat proctosigmoidoscopy that showed severe inflammation likely from underlying Crohn's disease, active C. difficile infection and possible ischemic colitis from prior hypotension on presentation.  She underwent ex lap with total total colectomy and ileostomy on 10/11.  Intraoperatively, found to have frank necrosis with pelvic abscess formation and significant colitis. Unfortunately, she became tachycardic with hypotension and lethargy and transferred back to ICU on 10/13.  Blood culture on 10/15 grew Candida glabrata for which she was started on micafungin.  CT abdomen and pelvis on 10/16 concerning for incidental small PE to RLL pulmonary artery branch raising concern for septic emboli.  She underwent TEE on 10/18 that was negative for  endocarditis.  Patient completed course for antibiotics and p.o. vancomycin.  Remains on micafungin for fungemia for a total of 6 weeks from 10/16.  ID following intermittently.  In regards to abdominal wound, wound VAC removed.  Surgery recommended wet-to-dry dressing.  Surgery following weekly.  Therapy recommended CIR but turned it down by CIR due lack of 24/7 supervision and need for prolonged rehabilitation.  Plan is discharge to SNF once tachycardia and diarrhea fairly controlled.  We have been titrating metoprolol.  Added ivabradine on 11/6.  Weaning of IV pain medication.   On 11/9 the patient relates having difficulty with her ostomy bag. I explained that the wound care nurse will be available to help on 03/14/2023.   Subjective: The patient is resting comfortably in bed.  Objective: Vitals:   03/12/23 2250 03/13/23 0103 03/13/23 0508 03/13/23 0910  BP: 128/64 (!) 111/52 (!) 118/55 (!) 114/51  Pulse:  90  (!) 115  Resp: 17 14 12    Temp: 98.4 F (36.9 C) 97.7 F (36.5 C) 98.3 F (36.8 C) 97.9 F (36.6 C)  TempSrc: Oral Oral Oral Oral  SpO2: 96% 98% 98% 97%  Weight:   59.5 kg   Height:        Examination:  Exam:  Constitutional:  The patient is awake, alert, and oriented x 3. No acute distress. Respiratory:  No increased work of breathing. No wheezes, rales, or rhonchi No tactile fremitus Cardiovascular:  Regular rate and rhythm No murmurs, ectopy, or gallups. No lateral PMI. No thrills. Abdomen:  Abdomen is soft, non-tender, non-distended No hernias, masses, or organomegaly Normoactive bowel sounds.  Musculoskeletal:  No cyanosis, clubbing, or edema Skin:  No rashes, lesions, ulcers palpation of skin: no induration  or nodules Neurologic:  CN 2-12 intact Sensation all 4 extremities intact Psychiatric:  Mental status Mood, affect appropriate Orientation to person, place, time  judgment and insight appear intact   Microbiology  summarized: 9/26-COVID-19, influenza and RSV PCR nonreactive 9/26 and 10/13-MRSA PCR screen negative 9/26-blood culture with staph hominis in 1 out of 2 bottles likely contaminant 9/29-C. difficile PCR positive 9/29-GIP negative 10/12-blood culture with Candida glabrata in 1 out of 2 bottles 10/17-blood cultures NGTD 10/27-pleural fluid culture NGTD  Assessment and plan: Septic shock, multifactorial-including C. difficile colitis, fungemia, intra-abdominal abscess Fungemia, candida glabrata, likely POA Cdiff Colitis: POA.  Completed treatment course. Septic emboli, thrombocytosis Intra-abdominal infection/abscess -Culture data as above. -S/p laparotomy with total colectomy with ileostomy and LLQ JP drain -Received ceftriaxone and Flagyl for extended course until 10/28. -TTE/TEE negative for vegetation.  Bubble study negative. -Appreciate input by ID -Continue antifungal for 6 weeks for fungemia.  Started on 10/16>>>. -Complete 14 days of p.o. vancomycin followed by taper for C. Difficile-completed. -Ophthalmology or neuro consultation.  -Evaluated by ophthalmology on 10/23.  No evidence of fungal infiltrate on funduscopic evaluation -Left subclavian tunneled CVC placed 02/21/2023 with IR -Leukocytosis resolved.  Still with diarrhea probably due to colectomy. -Wound care per general surgery and wound care staff.  WTD daily -Continue fentanyl patch.  Increase p.o. Dilaudid to 4 mg every 4 hours as needed moderate pain -Decreased IV Dilaudid to 0.5 mg every 6 hours as needed severe pain -Patient has been encouraged to minimize IV Dilaudid.  -Wound vac not functioning properly. To be attended to by wound care when available.  Intractable abdominal pain: Likely due to the above. Left-sided chest pain: EKG and troponin negative.  Chest pain resolved.  Left-sided headache-Attributing to oxycodone.  Oxycodone stopped.  Resolved. Nausea without emesis -Pain regimen as above. -Continue  home amitriptyline, gabapentin and Cymbalta -Add Compazine for refractory nausea and vomiting  Moderate to severe malnutrition Postoperative short bowel syndrome/high output ostomy Malabsorption likely acute on chronic -Increased Lomotil to 4 times daily on 11/4 -Continue Imodium 4 mg 4 times daily -Encourage oral intake. -Optimize nutrition -Not a candidate for TPN due to fungemia  Sinus tachycardia: Likely due to acute illness and possible dehydration.  TSH normal.  He had some pericardial effusion without tamponade.  Repeat CT on 10/25 did not reveal pericardial effusion.  HR improved after adding ivabradine -Increased metoprolol to 62.5 mg twice daily on 11/3. -Added ivabradine 5 mg twice daily on 11/6 -May consider repeat echocardiogram if no improvement   Left-sided pleural effusion/iatrogenic pneumothorax, small: S/p thoracocentesis on 10/27.  Fluid is exudative by LDH.  Fluid culture NGTD.  Subsequent chest x-ray on 10/28 without pneumothorax.  Cytology negative. 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  Previous provider discussed with Dr. Cardell Peach with urology who recommended outpatient evaluation with cystoscopy  Acute pulmonary emboli/right upper extremity DVT -Continue Lovenox   Thrombocytosis: Platelet about 1200k. Evaluated by hematology and felt to be inflammatory response.  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   -Monitor intermittently -Outpatient follow-up with hematology   Longstanding Crohn's disease: Not chronically on steroids so less concern for adrenal insufficiency. S/p colectomy -Steroids/Skyrizi held in the setting of infection.  Pericardial effusion without tamponade: Initially seen on TTE.  Was not seen on CTA on 10/25  -May need outpatient echocardiogram to confirm resolution.  Cardiac arrest secondary to septic shock requiring CPR on admission   Controlled DM-2  with  hyperglycemia: A1c 5.8% on 9/26.  CBG consistently in low 100s.  -Discontinued SSI and CBG monitoring   History of seizures -Continue PTA keppra/gabapentin   History of anxiety -Resume home Xanax, Cymbalta, hydroxyzine   History of hypertension -Metoprolol as above   History of COPD: Stable -Continue Yupelri daily and xopenex PRN   GERD -Continue PTA PPI   Normocytic Anemia: Stable -Transfuse for Hb <7 or hemodynamically significant bleeding  Hypokalemia/hypomagnesemia -Monitor replenish as appropriate   Inadequate oral intake/weight loss: Lost about 30 pounds so far. Body mass index is 23.25 kg/m. Nutrition Problem: Inadequate oral intake Etiology: inability to eat Signs/Symptoms: NPO status Interventions: Refer to RD note for recommendations   DVT prophylaxis:  SCDs Start: 01/27/23 1111  Code Status: Full code Family Communication: None at bedside Level of care: Telemetry Cardiac Status is: Inpatient Remains inpatient appropriate because: Abdominal pain, tachycardia, high ostomy output   Final disposition: SNF Consultants:  Pulmonology General Surgery Hematology Urology Infectious disease Ophthalmology   33 minutes with more than 50% spent in reviewing records, counseling patient/family and coordinating care.   Sch Meds:  Scheduled Meds:  acetaminophen  650 mg Oral Q6H WA   amitriptyline  25 mg Oral QHS   Chlorhexidine Gluconate Cloth  6 each Topical Daily   diphenoxylate-atropine  1 tablet Oral QID   DULoxetine  30 mg Oral Daily   enoxaparin (LOVENOX) injection  60 mg Subcutaneous Q12H   feeding supplement  237 mL Oral TID BM   fentaNYL  1 patch Transdermal Q72H   folic acid  1 mg Oral Daily   gabapentin  100 mg Oral TID   Gerhardt's butt cream   Topical BID   ivabradine  5 mg Oral BID WC   levETIRAcetam  750 mg Oral BID   lidocaine  2 patch Transdermal Q24H   lidocaine (PF)  8 mL Intradermal Once   loperamide  4 mg Oral QID   loratadine  10  mg Oral Daily   methocarbamol  1,000 mg Oral TID   metoprolol tartrate  62.5 mg Oral BID   multivitamin with minerals  1 tablet Oral Daily   pantoprazole  40 mg Oral Daily   revefenacin  175 mcg Nebulization Daily   sodium chloride flush  10-40 mL Intracatheter Q12H   thiamine  100 mg Oral Daily   vancomycin  125 mg Oral Daily   Followed by   Melene Muller ON 03/20/2023] vancomycin  125 mg Oral QODAY   Followed by   Melene Muller ON 03/28/2023] vancomycin  125 mg Oral Q3 days   Continuous Infusions:  micafungin (MYCAMINE) 150 mg in sodium chloride 0.9 % 100 mL IVPB Stopped (03/12/23 1223)   PRN Meds:.ALPRAZolam, bismuth subsalicylate, camphor-menthol, dextrose, HYDROmorphone (DILAUDID) injection, HYDROmorphone, levalbuterol, magic mouthwash, metoprolol tartrate, ondansetron (ZOFRAN) IV, mouth rinse, prochlorperazine, sodium chloride flush  Antimicrobials: Anti-infectives (From admission, onward)    Start     Dose/Rate Route Frequency Ordered Stop   03/28/23 1000  vancomycin (VANCOCIN) capsule 125 mg       Placed in "Followed by" Linked Group   125 mg Oral Every 3 DAYS 03/01/23 0750 04/06/23 0959   03/20/23 1000  vancomycin (VANCOCIN) capsule 125 mg       Placed in "Followed by" Linked Group   125 mg Oral Every other day 03/01/23 0750 03/28/23 0959   03/13/23 1000  vancomycin (VANCOCIN) capsule 125 mg       Placed in "Followed by" Linked Group   125  mg Oral Daily 03/01/23 0750 03/20/23 0959   03/06/23 1000  vancomycin (VANCOCIN) capsule 125 mg       Placed in "Followed by" Linked Group   125 mg Oral 2 times daily 03/01/23 0750 03/12/23 2303   03/01/23 1000  vancomycin (VANCOCIN) capsule 125 mg       Placed in "Followed by" Linked Group   125 mg Oral 4 times daily 03/01/23 0750 03/05/23 2134   02/18/23 1800  vancomycin (VANCOCIN) 50 mg/mL oral solution SOLN 125 mg        125 mg Oral 4 times daily 02/18/23 1439 02/26/23 0959   02/18/23 0400  micafungin (MYCAMINE) 150 mg in sodium chloride 0.9  % 100 mL IVPB        150 mg 107.5 mL/hr over 1 Hours Intravenous Every 24 hours 02/17/23 0936 03/30/23 2359   02/17/23 0900  vancomycin (VANCOREADY) IVPB 750 mg/150 mL  Status:  Discontinued        750 mg 150 mL/hr over 60 Minutes Intravenous Every 8 hours 02/17/23 0204 02/17/23 0947   02/17/23 0400  micafungin (MYCAMINE) 100 mg in sodium chloride 0.9 % 100 mL IVPB  Status:  Discontinued        100 mg 105 mL/hr over 1 Hours Intravenous Every 24 hours 02/16/23 0353 02/17/23 0936   02/16/23 1045  vancomycin (VANCOCIN) 50 mg/mL oral solution SOLN 125 mg  Status:  Discontinued        125 mg Per Tube 4 times daily 02/16/23 0953 02/18/23 1439   02/16/23 0415  micafungin (MYCAMINE) 100 mg in sodium chloride 0.9 % 100 mL IVPB        100 mg 105 mL/hr over 1 Hours Intravenous STAT 02/16/23 0353 02/16/23 0610   02/15/23 1300  vancomycin (VANCOREADY) IVPB 750 mg/150 mL  Status:  Discontinued        750 mg 150 mL/hr over 60 Minutes Intravenous Every 12 hours 02/14/23 1156 02/17/23 0204   02/14/23 1130  vancomycin (VANCOCIN) IVPB 1000 mg/200 mL premix        1,000 mg 200 mL/hr over 60 Minutes Intravenous  Once 02/14/23 1043 02/14/23 1249   02/11/23 2000  cefTRIAXone (ROCEPHIN) 2 g in sodium chloride 0.9 % 100 mL IVPB  Status:  Discontinued       Note to Pharmacy: Pharmacy may adjust dosing strength / duration / interval for maximal efficacy   2 g 200 mL/hr over 30 Minutes Intravenous Every 24 hours 02/11/23 1622 02/28/23 0852   02/10/23 1600  metroNIDAZOLE (FLAGYL) IVPB 500 mg  Status:  Discontinued        500 mg 100 mL/hr over 60 Minutes Intravenous Every 12 hours 02/10/23 1444 02/28/23 0857   02/08/23 1800  vancomycin (VANCOCIN) 50 mg/mL oral solution SOLN 500 mg        500 mg Per Tube Every 6 hours 02/08/23 1325 02/12/23 1759   02/03/23 2200  metroNIDAZOLE (FLAGYL) tablet 500 mg        500 mg Per Tube Every 12 hours 02/03/23 1406 02/06/23 2148   02/02/23 1815  vancomycin (VANCOCIN) 50 mg/mL oral  solution SOLN 125 mg  Status:  Discontinued        125 mg Per Tube Every 6 hours 02/02/23 1717 02/08/23 1325   02/02/23 1545  vancomycin (VANCOCIN) capsule 125 mg  Status:  Discontinued        125 mg Oral 4 times daily 02/02/23 1530 02/02/23 1717   01/27/23 1430  cefTRIAXone (ROCEPHIN) 2  g in sodium chloride 0.9 % 100 mL IVPB        2 g 200 mL/hr over 30 Minutes Intravenous Every 24 hours 01/27/23 1340 02/05/23 1403   01/27/23 1400  metroNIDAZOLE (FLAGYL) IVPB 500 mg  Status:  Discontinued        500 mg 100 mL/hr over 60 Minutes Intravenous Every 12 hours 01/27/23 1340 02/03/23 1406   01/27/23 0545  vancomycin (VANCOREADY) IVPB 1250 mg/250 mL        1,250 mg 166.7 mL/hr over 90 Minutes Intravenous  Once 01/27/23 0542 01/27/23 0809   01/27/23 0545  ceFEPIme (MAXIPIME) 2 g in sodium chloride 0.9 % 100 mL IVPB        2 g 200 mL/hr over 30 Minutes Intravenous  Once 01/27/23 0542 01/27/23 0621   01/27/23 0545  metroNIDAZOLE (FLAGYL) IVPB 500 mg        500 mg 100 mL/hr over 60 Minutes Intravenous  Once 01/27/23 0542 01/27/23 0741        I have personally reviewed the following labs and images: CBC: Recent Labs  Lab 03/07/23 0436 03/10/23 0220 03/11/23 0555 03/12/23 0640  WBC 9.8 11.0* 10.8* 9.5  HGB 10.7* 11.0* 11.5* 9.9*  HCT 34.4* 34.3* 35.5* 32.0*  MCV 86.4 86.6 87.9 87.7  PLT 1,210* 961* 949* 873*   BMP &GFR Recent Labs  Lab 03/07/23 0436 03/10/23 0220 03/11/23 0555 03/12/23 0640  NA 131* 127* 126* 132*  K 4.7 3.7 4.1 3.8  CL 93* 90* 91* 101  CO2 27 25 25 23   GLUCOSE 87 94 92 93  BUN 6 10 12 8   CREATININE 0.57 0.85 0.86 0.70  CALCIUM 10.2 10.2 10.0 9.2  MG 1.7 1.3* 2.0 1.5*  PHOS 4.6 3.8 3.7 3.5   Estimated Creatinine Clearance: 67.3 mL/min (by C-G formula based on SCr of 0.7 mg/dL). Liver & Pancreas: Recent Labs  Lab 03/07/23 0436 03/10/23 0220 03/11/23 0555 03/12/23 0640  ALBUMIN 3.0* 3.0* 3.0* 2.7*   No results for input(s): "LIPASE", "AMYLASE" in  the last 168 hours. No results for input(s): "AMMONIA" in the last 168 hours. Diabetic: No results for input(s): "HGBA1C" in the last 72 hours. Recent Labs  Lab 03/07/23 0822 03/07/23 1228 03/07/23 1640 03/07/23 2047 03/08/23 0556  GLUCAP 108* 110* 102* 102* 102*   Cardiac Enzymes: No results for input(s): "CKTOTAL", "CKMB", "CKMBINDEX", "TROPONINI" in the last 168 hours. No results for input(s): "PROBNP" in the last 8760 hours. Coagulation Profile: No results for input(s): "INR", "PROTIME" in the last 168 hours. Thyroid Function Tests: No results for input(s): "TSH", "T4TOTAL", "FREET4", "T3FREE", "THYROIDAB" in the last 72 hours. Lipid Profile: No results for input(s): "CHOL", "HDL", "LDLCALC", "TRIG", "CHOLHDL", "LDLDIRECT" in the last 72 hours. Anemia Panel: No results for input(s): "VITAMINB12", "FOLATE", "FERRITIN", "TIBC", "IRON", "RETICCTPCT" in the last 72 hours. Urine analysis:    Component Value Date/Time   COLORURINE AMBER (A) 01/27/2023 0514   APPEARANCEUR HAZY (A) 01/27/2023 0514   LABSPEC 1.020 01/27/2023 0514   PHURINE 5.0 01/27/2023 0514   GLUCOSEU NEGATIVE 01/27/2023 0514   GLUCOSEU NEG mg/dL 08/65/7846 9629   HGBUR NEGATIVE 01/27/2023 0514   BILIRUBINUR NEGATIVE 01/27/2023 0514   KETONESUR 5 (A) 01/27/2023 0514   PROTEINUR 30 (A) 01/27/2023 0514   UROBILINOGEN 0.2 12/26/2013 1530   NITRITE NEGATIVE 01/27/2023 0514   LEUKOCYTESUR TRACE (A) 01/27/2023 0514   Sepsis Labs: Invalid input(s): "PROCALCITONIN", "LACTICIDVEN"  Microbiology: No results found for this or any previous visit (from the  past 240 hour(s)).   Radiology Studies: No results found.    Nichelle Renwick, DO [facility]  If 7PM-7AM, please contact night-coverage [email or web address] 03/12/2023  7:15 PM

## 2023-03-13 NOTE — Assessment & Plan Note (Signed)
Resolved

## 2023-03-13 NOTE — Progress Notes (Signed)
PROGRESS NOTE  [name] WJX:[id number] DOB: 02-Nov-1970   PCP: [name], PA-C  Patient is from: Home  DOA: 01/27/2023 LOS: 45  Chief complaints Chief Complaint  Patient presents with   Respiratory Arrest     Brief Narrative / Interim history: 52 year old female with past medical history of COPD, Crohn's on Skyrizi, hypertension, type 2 diabetes mellitus who initially presented to the after being found unresponsive. Per family, patient has had bloody emesis with diarrhea over the past few days. Initial systolics in the 60s in the ED, and patient was minimally responsive. In the emergency room, patient was intubated and admitted to the ICU for septic shock and encephalopathy on 01/27/2023.  For further evaluation and management for shock and altered mental status.   Further workup revealed C. difficile colitis, intra-abdominal infection with abscess and fungemia.  ID consulted and started on prolonged p.o. vancomycin with taper and micafungin in addition to IV antibiotics for intra-abdominal abscess.  Eventually, she came off vasopressors on 9/29 and extubated on 10/2 and transferred to floor on 10/3.   Patient with ongoing diarrhea and had repeat proctosigmoidoscopy that showed severe inflammation likely from underlying Crohn's disease, active C. difficile infection and possible ischemic colitis from prior hypotension on presentation.  She underwent ex lap with total total colectomy and ileostomy on 10/11.  Intraoperatively, found to have frank necrosis with pelvic abscess formation and significant colitis. Unfortunately, she became tachycardic with hypotension and lethargy and transferred back to ICU on 10/13.  Blood culture on 10/15 grew Candida glabrata for which she was started on micafungin.  CT abdomen and pelvis on 10/16 concerning for incidental small PE to RLL pulmonary artery branch raising concern for septic emboli.  She underwent TEE on 10/18 that was negative for  endocarditis.  Patient completed course for antibiotics and p.o. vancomycin.  Remains on micafungin for fungemia for a total of 6 weeks from 10/16.  ID following intermittently.  In regards to abdominal wound, wound VAC removed.  Surgery recommended wet-to-dry dressing.  Surgery following weekly.  Therapy recommended CIR but turned it down by CIR due lack of 24/7 supervision and need for prolonged rehabilitation.  Plan is discharge to SNF once tachycardia and diarrhea fairly controlled.  We have been titrating metoprolol.  Added ivabradine on 11/6.  Weaning of IV pain medication.   On 11/9 the patient relates having difficulty with her ostomy bag. I explained that the wound care nurse will be available to help on 03/14/2023.   Subjective: The patient is resting comfortably in bed.  Objective: Vitals:   03/13/23 0508 03/13/23 0910 03/13/23 1219 03/13/23 1610  BP: (!) 118/55 (!) 114/51 117/79 (!) 114/57  Pulse:  (!) 115 (!) 114 100  Resp: 12  14 14   Temp: 98.3 F (36.8 C) 97.9 F (36.6 C) 98 F (36.7 C)   TempSrc: Oral Oral Oral   SpO2: 98% 97% 100% 99%  Weight: 59.5 kg     Height:        Examination:  Exam:  Constitutional:  The patient is awake, alert, and oriented x 3. No acute distress. Respiratory:  No increased work of breathing. No wheezes, rales, or rhonchi No tactile fremitus Cardiovascular:  Regular rate and rhythm No murmurs, ectopy, or gallups. No lateral PMI. No thrills. Abdomen:  Abdomen is soft, non-tender, non-distended No hernias, masses, or organomegaly Normoactive bowel sounds.  Musculoskeletal:  No cyanosis, clubbing, or edema Skin:  No rashes, lesions, ulcers palpation of skin: no induration or nodules  Neurologic:  CN 2-12 intact Sensation all 4 extremities intact Psychiatric:  Mental status Mood, affect appropriate Orientation to person, place, time  judgment and insight appear intact   Microbiology summarized: 9/26-COVID-19, influenza  and RSV PCR nonreactive 9/26 and 10/13-MRSA PCR screen negative 9/26-blood culture with staph hominis in 1 out of 2 bottles likely contaminant 9/29-C. difficile PCR positive 9/29-GIP negative 10/12-blood culture with Candida glabrata in 1 out of 2 bottles 10/17-blood cultures NGTD 10/27-pleural fluid culture NGTD  Assessment and plan: Septic shock, multifactorial-including C. difficile colitis, fungemia, intra-abdominal abscess Fungemia, candida glabrata, likely POA Cdiff Colitis: POA.  Completed treatment course. Septic emboli, thrombocytosis Intra-abdominal infection/abscess -Culture data as above. -S/p laparotomy with total colectomy with ileostomy and LLQ JP drain -Received ceftriaxone and Flagyl for extended course until 10/28. -TTE/TEE negative for vegetation.  Bubble study negative. -Appreciate input by ID -Continue antifungal for 6 weeks for fungemia.  Started on 10/16>>>. -Complete 14 days of p.o. vancomycin followed by taper for C. Difficile-completed. -Ophthalmology or neuro consultation.  -Evaluated by ophthalmology on 10/23.  No evidence of fungal infiltrate on funduscopic evaluation -Left subclavian tunneled CVC placed 02/21/2023 with IR -Leukocytosis resolved.  Still with diarrhea probably due to colectomy. -Wound care per general surgery and wound care staff.  WTD daily -Continue fentanyl patch.  Increase p.o. Dilaudid to 4 mg every 4 hours as needed moderate pain -Discontinued IV Dilaudid to 0.5 mg every 6 hours as needed severe pain as patient has fentanyl patch and oral dilaudid for pain control. -Wound vac not functioning properly. To be attended to by wound care when available.  Intractable abdominal pain: Likely due to the above. Left-sided chest pain: EKG and troponin negative.  Chest pain resolved.  Left-sided headache-Attributing to oxycodone.  Oxycodone stopped.  Resolved. Nausea without emesis -Pain regimen as above. -Continue home amitriptyline,  gabapentin and Cymbalta -Add Compazine for refractory nausea and vomiting  Moderate to severe malnutrition Postoperative short bowel syndrome/high output ostomy Malabsorption likely acute on chronic -Increased Lomotil to 4 times daily on 11/4 -Continue Imodium 4 mg 4 times daily -Encourage oral intake. -Optimize nutrition -Not a candidate for TPN due to fungemia  Sinus tachycardia: Likely due to acute illness and possible dehydration.  TSH normal.  He had some pericardial effusion without tamponade.  Repeat CT on 10/25 did not reveal pericardial effusion.  HR improved after adding ivabradine -Increased metoprolol to 62.5 mg twice daily on 11/3. -Added ivabradine 5 mg twice daily on 11/6 -improving slowly.   Left-sided pleural effusion/iatrogenic pneumothorax, small: S/p thoracocentesis on 10/27.  Fluid is exudative by LDH.  Fluid culture NGTD.  Subsequent chest x-ray on 10/28 without pneumothorax.  Cytology negative. 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  Previous provider discussed with Dr. Cardell Peach with urology who recommended outpatient evaluation with cystoscopy  Acute pulmonary emboli/right upper extremity DVT -Continue Lovenox   Thrombocytosis: Platelet about 1200k. Evaluated by hematology and felt to be inflammatory response.  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   -Monitor intermittently -Outpatient follow-up with hematology   Longstanding Crohn's disease: Not chronically on steroids so less concern for adrenal insufficiency. S/p colectomy -Steroids/Skyrizi held in the setting of infection.  Pericardial effusion without tamponade: Initially seen on TTE.  Was not seen on CTA on 10/25  -May need outpatient echocardiogram to confirm resolution.  Cardiac arrest secondary to septic shock requiring CPR on admission   Controlled DM-2 with hyperglycemia: A1c 5.8% on  9/26.  CBG consistently in low  100s.  -Discontinued SSI and CBG monitoring   History of seizures -Continue PTA keppra/gabapentin   History of anxiety -Resume home Xanax, Cymbalta, hydroxyzine   History of hypertension -Metoprolol as above   History of COPD: Stable -Continue Yupelri daily and xopenex PRN   GERD -Continue PTA PPI   Normocytic Anemia: Stable -Transfuse for Hb <7 or hemodynamically significant bleeding  Hypokalemia/hypomagnesemia -Monitor replenish as appropriate   Inadequate oral intake/weight loss: Lost about 30 pounds so far. Body mass index is 23.25 kg/m. Nutrition Problem: Inadequate oral intake Etiology: inability to eat Signs/Symptoms: NPO status Interventions: Refer to RD note for recommendations   DVT prophylaxis:  SCDs Start: 01/27/23 1111  Code Status: Full code Family Communication: None at bedside Level of care: Telemetry Cardiac Status is: Inpatient Remains inpatient appropriate because: Abdominal pain, tachycardia, high ostomy output   Final disposition: SNF Consultants:  Pulmonology General Surgery Hematology Urology Infectious disease Ophthalmology   34 minutes with more than 50% spent in reviewing records, counseling patient/family and coordinating care.   Sch Meds:  Scheduled Meds:  acetaminophen  650 mg Oral Q6H WA   amitriptyline  25 mg Oral QHS   Chlorhexidine Gluconate Cloth  6 each Topical Daily   diphenoxylate-atropine  1 tablet Oral QID   DULoxetine  30 mg Oral Daily   enoxaparin (LOVENOX) injection  60 mg Subcutaneous Q12H   feeding supplement  237 mL Oral TID BM   fentaNYL  1 patch Transdermal Q72H   folic acid  1 mg Oral Daily   gabapentin  100 mg Oral TID   Gerhardt's butt cream   Topical BID   ivabradine  5 mg Oral BID WC   levETIRAcetam  750 mg Oral BID   lidocaine  2 patch Transdermal Q24H   lidocaine (PF)  8 mL Intradermal Once   loperamide  4 mg Oral QID   loratadine  10 mg Oral Daily   methocarbamol  1,000 mg Oral TID    metoprolol tartrate  62.5 mg Oral BID   multivitamin with minerals  1 tablet Oral Daily   pantoprazole  40 mg Oral Daily   revefenacin  175 mcg Nebulization Daily   sodium chloride flush  10-40 mL Intracatheter Q12H   thiamine  100 mg Oral Daily   vancomycin  125 mg Oral Daily   Followed by   Melene Muller ON 03/20/2023] vancomycin  125 mg Oral QODAY   Followed by   Melene Muller ON 03/28/2023] vancomycin  125 mg Oral Q3 days   Continuous Infusions:  micafungin (MYCAMINE) 150 mg in sodium chloride 0.9 % 100 mL IVPB 108 mL/hr at 03/13/23 1220   PRN Meds:.ALPRAZolam, bismuth subsalicylate, camphor-menthol, dextrose, HYDROmorphone (DILAUDID) injection, HYDROmorphone, levalbuterol, magic mouthwash, metoprolol tartrate, ondansetron (ZOFRAN) IV, mouth rinse, prochlorperazine, sodium chloride flush  Antimicrobials: Anti-infectives (From admission, onward)    Start     Dose/Rate Route Frequency Ordered Stop   03/28/23 1000  vancomycin (VANCOCIN) capsule 125 mg       Placed in "Followed by" Linked Group   125 mg Oral Every 3 DAYS 03/01/23 0750 04/06/23 0959   03/20/23 1000  vancomycin (VANCOCIN) capsule 125 mg       Placed in "Followed by" Linked Group   125 mg Oral Every other day 03/01/23 0750 03/28/23 0959   03/13/23 1000  vancomycin (VANCOCIN) capsule 125 mg       Placed in "Followed by" Linked Group   125 mg Oral Daily  03/01/23 0750 03/20/23 0959   03/06/23 1000  vancomycin (VANCOCIN) capsule 125 mg       Placed in "Followed by" Linked Group   125 mg Oral 2 times daily 03/01/23 0750 03/12/23 2303   03/01/23 1000  vancomycin (VANCOCIN) capsule 125 mg       Placed in "Followed by" Linked Group   125 mg Oral 4 times daily 03/01/23 0750 03/05/23 2134   02/18/23 1800  vancomycin (VANCOCIN) 50 mg/mL oral solution SOLN 125 mg        125 mg Oral 4 times daily 02/18/23 1439 02/26/23 0959   02/18/23 0400  micafungin (MYCAMINE) 150 mg in sodium chloride 0.9 % 100 mL IVPB        150 mg 107.5 mL/hr over 1  Hours Intravenous Every 24 hours 02/17/23 0936 03/30/23 2359   02/17/23 0900  vancomycin (VANCOREADY) IVPB 750 mg/150 mL  Status:  Discontinued        750 mg 150 mL/hr over 60 Minutes Intravenous Every 8 hours 02/17/23 0204 02/17/23 0947   02/17/23 0400  micafungin (MYCAMINE) 100 mg in sodium chloride 0.9 % 100 mL IVPB  Status:  Discontinued        100 mg 105 mL/hr over 1 Hours Intravenous Every 24 hours 02/16/23 0353 02/17/23 0936   02/16/23 1045  vancomycin (VANCOCIN) 50 mg/mL oral solution SOLN 125 mg  Status:  Discontinued        125 mg Per Tube 4 times daily 02/16/23 0953 02/18/23 1439   02/16/23 0415  micafungin (MYCAMINE) 100 mg in sodium chloride 0.9 % 100 mL IVPB        100 mg 105 mL/hr over 1 Hours Intravenous STAT 02/16/23 0353 02/16/23 0610   02/15/23 1300  vancomycin (VANCOREADY) IVPB 750 mg/150 mL  Status:  Discontinued        750 mg 150 mL/hr over 60 Minutes Intravenous Every 12 hours 02/14/23 1156 02/17/23 0204   02/14/23 1130  vancomycin (VANCOCIN) IVPB 1000 mg/200 mL premix        1,000 mg 200 mL/hr over 60 Minutes Intravenous  Once 02/14/23 1043 02/14/23 1249   02/11/23 2000  cefTRIAXone (ROCEPHIN) 2 g in sodium chloride 0.9 % 100 mL IVPB  Status:  Discontinued       Note to Pharmacy: Pharmacy may adjust dosing strength / duration / interval for maximal efficacy   2 g 200 mL/hr over 30 Minutes Intravenous Every 24 hours 02/11/23 1622 02/28/23 0852   02/10/23 1600  metroNIDAZOLE (FLAGYL) IVPB 500 mg  Status:  Discontinued        500 mg 100 mL/hr over 60 Minutes Intravenous Every 12 hours 02/10/23 1444 02/28/23 0857   02/08/23 1800  vancomycin (VANCOCIN) 50 mg/mL oral solution SOLN 500 mg        500 mg Per Tube Every 6 hours 02/08/23 1325 02/12/23 1759   02/03/23 2200  metroNIDAZOLE (FLAGYL) tablet 500 mg        500 mg Per Tube Every 12 hours 02/03/23 1406 02/06/23 2148   02/02/23 1815  vancomycin (VANCOCIN) 50 mg/mL oral solution SOLN 125 mg  Status:  Discontinued         125 mg Per Tube Every 6 hours 02/02/23 1717 02/08/23 1325   02/02/23 1545  vancomycin (VANCOCIN) capsule 125 mg  Status:  Discontinued        125 mg Oral 4 times daily 02/02/23 1530 02/02/23 1717   01/27/23 1430  cefTRIAXone (ROCEPHIN) 2 g in sodium  chloride 0.9 % 100 mL IVPB        2 g 200 mL/hr over 30 Minutes Intravenous Every 24 hours 01/27/23 1340 02/05/23 1403   01/27/23 1400  metroNIDAZOLE (FLAGYL) IVPB 500 mg  Status:  Discontinued        500 mg 100 mL/hr over 60 Minutes Intravenous Every 12 hours 01/27/23 1340 02/03/23 1406   01/27/23 0545  vancomycin (VANCOREADY) IVPB 1250 mg/250 mL        1,250 mg 166.7 mL/hr over 90 Minutes Intravenous  Once 01/27/23 0542 01/27/23 0809   01/27/23 0545  ceFEPIme (MAXIPIME) 2 g in sodium chloride 0.9 % 100 mL IVPB        2 g 200 mL/hr over 30 Minutes Intravenous  Once 01/27/23 0542 01/27/23 0621   01/27/23 0545  metroNIDAZOLE (FLAGYL) IVPB 500 mg        500 mg 100 mL/hr over 60 Minutes Intravenous  Once 01/27/23 0542 01/27/23 0741        I have personally reviewed the following labs and images: CBC: Recent Labs  Lab 03/07/23 0436 03/10/23 0220 03/11/23 0555 03/12/23 0640 03/13/23 1156  WBC 9.8 11.0* 10.8* 9.5 11.3*  NEUTROABS  --   --   --   --  8.2*  HGB 10.7* 11.0* 11.5* 9.9* 10.6*  HCT 34.4* 34.3* 35.5* 32.0* 33.0*  MCV 86.4 86.6 87.9 87.7 88.0  PLT 1,210* 961* 949* 873* 775*   BMP &GFR Recent Labs  Lab 03/07/23 0436 03/10/23 0220 03/11/23 0555 03/12/23 0640 03/13/23 1156  NA 131* 127* 126* 132*  --   K 4.7 3.7 4.1 3.8  --   CL 93* 90* 91* 101  --   CO2 27 25 25 23   --   GLUCOSE 87 94 92 93  --   BUN 6 10 12 8   --   CREATININE 0.57 0.85 0.86 0.70  --   CALCIUM 10.2 10.2 10.0 9.2  --   MG 1.7 1.3* 2.0 1.5* 1.5*  PHOS 4.6 3.8 3.7 3.5  --    Estimated Creatinine Clearance: 67.3 mL/min (by C-G formula based on SCr of 0.7 mg/dL). Liver & Pancreas: Recent Labs  Lab 03/07/23 0436 03/10/23 0220 03/11/23 0555  03/12/23 0640  ALBUMIN 3.0* 3.0* 3.0* 2.7*   No results for input(s): "LIPASE", "AMYLASE" in the last 168 hours. No results for input(s): "AMMONIA" in the last 168 hours. Diabetic: No results for input(s): "HGBA1C" in the last 72 hours. Recent Labs  Lab 03/07/23 1228 03/07/23 1640 03/07/23 2047 03/08/23 0556 03/13/23 1210  GLUCAP 110* 102* 102* 102* 95   Cardiac Enzymes: No results for input(s): "CKTOTAL", "CKMB", "CKMBINDEX", "TROPONINI" in the last 168 hours. No results for input(s): "PROBNP" in the last 8760 hours. Coagulation Profile: No results for input(s): "INR", "PROTIME" in the last 168 hours. Thyroid Function Tests: No results for input(s): "TSH", "T4TOTAL", "FREET4", "T3FREE", "THYROIDAB" in the last 72 hours. Lipid Profile: No results for input(s): "CHOL", "HDL", "LDLCALC", "TRIG", "CHOLHDL", "LDLDIRECT" in the last 72 hours. Anemia Panel: No results for input(s): "VITAMINB12", "FOLATE", "FERRITIN", "TIBC", "IRON", "RETICCTPCT" in the last 72 hours. Urine analysis:    Component Value Date/Time   COLORURINE AMBER (A) 01/27/2023 0514   APPEARANCEUR HAZY (A) 01/27/2023 0514   LABSPEC 1.020 01/27/2023 0514   PHURINE 5.0 01/27/2023 0514   GLUCOSEU NEGATIVE 01/27/2023 0514   GLUCOSEU NEG mg/dL 42/70/6237 6283   HGBUR NEGATIVE 01/27/2023 0514   BILIRUBINUR NEGATIVE 01/27/2023 0514   KETONESUR 5 (A)  01/27/2023 0514   PROTEINUR 30 (A) 01/27/2023 0514   UROBILINOGEN 0.2 12/26/2013 1530   NITRITE NEGATIVE 01/27/2023 0514   LEUKOCYTESUR TRACE (A) 01/27/2023 0514   Sepsis Labs: Invalid input(s): "PROCALCITONIN", "LACTICIDVEN"  Microbiology: No results found for this or any previous visit (from the past 240 hour(s)).   Radiology Studies: No results found.    Trevyon Swor, DO [facility]  If 7PM-7AM, please contact night-coverage [email or web address] 03/13/2023, 4:34 PM

## 2023-03-14 ENCOUNTER — Other Ambulatory Visit (HOSPITAL_COMMUNITY): Payer: Self-pay | Admitting: *Deleted

## 2023-03-14 DIAGNOSIS — B49 Unspecified mycosis: Secondary | ICD-10-CM | POA: Diagnosis not present

## 2023-03-14 LAB — CBC WITH DIFFERENTIAL/PLATELET
Abs Immature Granulocytes: 0.07 10*3/uL (ref 0.00–0.07)
Basophils Absolute: 0.1 10*3/uL (ref 0.0–0.1)
Basophils Relative: 1 %
Eosinophils Absolute: 0.6 10*3/uL — ABNORMAL HIGH (ref 0.0–0.5)
Eosinophils Relative: 6 %
HCT: 34.1 % — ABNORMAL LOW (ref 36.0–46.0)
Hemoglobin: 10.4 g/dL — ABNORMAL LOW (ref 12.0–15.0)
Immature Granulocytes: 1 %
Lymphocytes Relative: 33 %
Lymphs Abs: 3 10*3/uL (ref 0.7–4.0)
MCH: 27.3 pg (ref 26.0–34.0)
MCHC: 30.5 g/dL (ref 30.0–36.0)
MCV: 89.5 fL (ref 80.0–100.0)
Monocytes Absolute: 0.6 10*3/uL (ref 0.1–1.0)
Monocytes Relative: 7 %
Neutro Abs: 4.8 10*3/uL (ref 1.7–7.7)
Neutrophils Relative %: 52 %
Platelets: 741 10*3/uL — ABNORMAL HIGH (ref 150–400)
RBC: 3.81 MIL/uL — ABNORMAL LOW (ref 3.87–5.11)
RDW: 16.6 % — ABNORMAL HIGH (ref 11.5–15.5)
WBC: 9.1 10*3/uL (ref 4.0–10.5)
nRBC: 0 % (ref 0.0–0.2)

## 2023-03-14 LAB — BASIC METABOLIC PANEL
Anion gap: 11 (ref 5–15)
BUN: 6 mg/dL (ref 6–20)
CO2: 22 mmol/L (ref 22–32)
Calcium: 10 mg/dL (ref 8.9–10.3)
Chloride: 99 mmol/L (ref 98–111)
Creatinine, Ser: 0.65 mg/dL (ref 0.44–1.00)
GFR, Estimated: >60 mL/min (ref >60)
Glucose, Bld: 73 mg/dL (ref 70–99)
Potassium: 4 mmol/L (ref 3.5–5.1)
Sodium: 132 mmol/L — ABNORMAL LOW (ref 135–145)

## 2023-03-14 LAB — GLUCOSE, CAPILLARY
Glucose-Capillary: 108 mg/dL — ABNORMAL HIGH (ref 70–99)
Glucose-Capillary: 101 mg/dL — ABNORMAL HIGH (ref 70–99)

## 2023-03-14 MED ORDER — TRIAZOLAM 0.125 MG PO TABS
0.2500 mg | ORAL_TABLET | Freq: Every evening | ORAL | Status: DC | PRN
  Administered 2023-03-15 – 2023-03-18 (×5): 0.25 mg via ORAL
  Filled 2023-03-14 (×5): qty 2

## 2023-03-14 MED ORDER — ALTEPLASE 2 MG IJ SOLR
2.0000 mg | Freq: Once | INTRAMUSCULAR | Status: AC
  Administered 2023-03-14: 2 mg
  Filled 2023-03-14: qty 2

## 2023-03-14 MED ORDER — HYDROMORPHONE HCL 1 MG/ML IJ SOLN
0.5000 mg | INTRAMUSCULAR | Status: DC | PRN
  Administered 2023-03-15 – 2023-03-16 (×3): 0.5 mg via INTRAVENOUS
  Filled 2023-03-14 (×4): qty 0.5

## 2023-03-14 MED ORDER — CALCIUM POLYCARBOPHIL 625 MG PO TABS
1250.0000 mg | ORAL_TABLET | Freq: Two times a day (BID) | ORAL | Status: DC
  Administered 2023-03-14 – 2023-03-19 (×11): 1250 mg via ORAL
  Filled 2023-03-14 (×12): qty 2

## 2023-03-14 MED ORDER — AMITRIPTYLINE HCL 50 MG PO TABS
100.0000 mg | ORAL_TABLET | Freq: Every day | ORAL | Status: DC
  Administered 2023-03-14 – 2023-03-18 (×5): 100 mg via ORAL
  Filled 2023-03-14 (×5): qty 2

## 2023-03-14 MED ORDER — APIXABAN 5 MG PO TABS
5.0000 mg | ORAL_TABLET | Freq: Two times a day (BID) | ORAL | Status: DC
Start: 2023-03-14 — End: 2023-03-19
  Administered 2023-03-14 – 2023-03-19 (×11): 5 mg via ORAL
  Filled 2023-03-14 (×11): qty 1

## 2023-03-14 NOTE — Progress Notes (Signed)
Patient ID: [name], female   DOB: Nov 18, 1970, 52 y.o.   MRN: [id number] 24 Days Post-Op    Subjective: No new issues ROS negative except as listed above. Objective: Vital signs in last 24 hours: Temp:  [97.9 F (36.6 C)-98.5 F (36.9 C)] 97.9 F (36.6 C) (11/11 0509) Pulse Rate:  [82-115] 82 (11/11 0509) Resp:  [12-14] 12 (11/11 0509) BP: (112-131)/(45-79) 112/45 (11/11 0509) SpO2:  [97 %-100 %] 98 % (11/11 0823) Weight:  [60.7 kg] 60.7 kg (11/11 0509) Last BM Date : 03/13/23 (via ostmy site)  Intake/Output from previous day: 11/10 0701 - 11/11 0700 In: 36.3 [I.V.:10; IV Piggyback:26.3] Out: 2580 [Urine:1250; Drains:30; Stool:1300] Intake/Output this shift: No intake/output data recorded.  Abd soft, midline nearly healed, ostomy with output  Lab Results: CBC  Recent Labs    03/12/23 0640 03/13/23 1156  WBC 9.5 11.3*  HGB 9.9* 10.6*  HCT 32.0* 33.0*  PLT 873* 775*   BMET Recent Labs    03/12/23 0640  NA 132*  K 3.8  CL 101  CO2 23  GLUCOSE 93  BUN 8  CREATININE 0.70  CALCIUM 9.2   PT/INR No results for input(s): "LABPROT", "INR" in the last 72 hours. ABG No results for input(s): "PHART", "HCO3" in the last 72 hours.  Invalid input(s): "PCO2", "PO2"  Studies/Results: No results found.  Anti-infectives: Anti-infectives (From admission, onward)    Start     Dose/Rate Route Frequency Ordered Stop   03/28/23 1000  vancomycin (VANCOCIN) capsule 125 mg       Placed in "Followed by" Linked Group   125 mg Oral Every 3 DAYS 03/01/23 0750 04/06/23 0959   03/20/23 1000  vancomycin (VANCOCIN) capsule 125 mg       Placed in "Followed by" Linked Group   125 mg Oral Every other day 03/01/23 0750 03/28/23 0959   03/13/23 1000  vancomycin (VANCOCIN) capsule 125 mg       Placed in "Followed by" Linked Group   125 mg Oral Daily 03/01/23 0750 03/20/23 0959   03/06/23 1000  vancomycin (VANCOCIN) capsule 125 mg       Placed in "Followed by" Linked Group   125  mg Oral 2 times daily 03/01/23 0750 03/12/23 2303   03/01/23 1000  vancomycin (VANCOCIN) capsule 125 mg       Placed in "Followed by" Linked Group   125 mg Oral 4 times daily 03/01/23 0750 03/05/23 2134   02/18/23 1800  vancomycin (VANCOCIN) 50 mg/mL oral solution SOLN 125 mg        125 mg Oral 4 times daily 02/18/23 1439 02/26/23 0959   02/18/23 0400  micafungin (MYCAMINE) 150 mg in sodium chloride 0.9 % 100 mL IVPB        150 mg 107.5 mL/hr over 1 Hours Intravenous Every 24 hours 02/17/23 0936 03/30/23 2359   02/17/23 0900  vancomycin (VANCOREADY) IVPB 750 mg/150 mL  Status:  Discontinued        750 mg 150 mL/hr over 60 Minutes Intravenous Every 8 hours 02/17/23 0204 02/17/23 0947   02/17/23 0400  micafungin (MYCAMINE) 100 mg in sodium chloride 0.9 % 100 mL IVPB  Status:  Discontinued        100 mg 105 mL/hr over 1 Hours Intravenous Every 24 hours 02/16/23 0353 02/17/23 0936   02/16/23 1045  vancomycin (VANCOCIN) 50 mg/mL oral solution SOLN 125 mg  Status:  Discontinued        125 mg Per Tube 4  times daily 02/16/23 0953 02/18/23 1439   02/16/23 0415  micafungin (MYCAMINE) 100 mg in sodium chloride 0.9 % 100 mL IVPB        100 mg 105 mL/hr over 1 Hours Intravenous STAT 02/16/23 0353 02/16/23 0610   02/15/23 1300  vancomycin (VANCOREADY) IVPB 750 mg/150 mL  Status:  Discontinued        750 mg 150 mL/hr over 60 Minutes Intravenous Every 12 hours 02/14/23 1156 02/17/23 0204   02/14/23 1130  vancomycin (VANCOCIN) IVPB 1000 mg/200 mL premix        1,000 mg 200 mL/hr over 60 Minutes Intravenous  Once 02/14/23 1043 02/14/23 1249   02/11/23 2000  cefTRIAXone (ROCEPHIN) 2 g in sodium chloride 0.9 % 100 mL IVPB  Status:  Discontinued       Note to Pharmacy: Pharmacy may adjust dosing strength / duration / interval for maximal efficacy   2 g 200 mL/hr over 30 Minutes Intravenous Every 24 hours 02/11/23 1622 02/28/23 0852   02/10/23 1600  metroNIDAZOLE (FLAGYL) IVPB 500 mg  Status:  Discontinued         500 mg 100 mL/hr over 60 Minutes Intravenous Every 12 hours 02/10/23 1444 02/28/23 0857   02/08/23 1800  vancomycin (VANCOCIN) 50 mg/mL oral solution SOLN 500 mg        500 mg Per Tube Every 6 hours 02/08/23 1325 02/12/23 1759   02/03/23 2200  metroNIDAZOLE (FLAGYL) tablet 500 mg        500 mg Per Tube Every 12 hours 02/03/23 1406 02/06/23 2148   02/02/23 1815  vancomycin (VANCOCIN) 50 mg/mL oral solution SOLN 125 mg  Status:  Discontinued        125 mg Per Tube Every 6 hours 02/02/23 1717 02/08/23 1325   02/02/23 1545  vancomycin (VANCOCIN) capsule 125 mg  Status:  Discontinued        125 mg Oral 4 times daily 02/02/23 1530 02/02/23 1717   01/27/23 1430  cefTRIAXone (ROCEPHIN) 2 g in sodium chloride 0.9 % 100 mL IVPB        2 g 200 mL/hr over 30 Minutes Intravenous Every 24 hours 01/27/23 1340 02/05/23 1403   01/27/23 1400  metroNIDAZOLE (FLAGYL) IVPB 500 mg  Status:  Discontinued        500 mg 100 mL/hr over 60 Minutes Intravenous Every 12 hours 01/27/23 1340 02/03/23 1406   01/27/23 0545  vancomycin (VANCOREADY) IVPB 1250 mg/250 mL        1,250 mg 166.7 mL/hr over 90 Minutes Intravenous  Once 01/27/23 0542 01/27/23 0809   01/27/23 0545  ceFEPIme (MAXIPIME) 2 g in sodium chloride 0.9 % 100 mL IVPB        2 g 200 mL/hr over 30 Minutes Intravenous  Once 01/27/23 0542 01/27/23 0621   01/27/23 0545  metroNIDAZOLE (FLAGYL) IVPB 500 mg        500 mg 100 mL/hr over 60 Minutes Intravenous  Once 01/27/23 0542 01/27/23 0741       Assessment/Plan: Refractory Crohn's disease POD 24, 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 Luisa Hart 10/11 - no signs of undrained abdominal collection on CT 10/16 - TEE negative for endocarditis  - CT abdomen/pelvis 10/15 for leukocytosis showed reduced mesenteric fluid collections, pleural effusions, mild ileus.  - regular diet -  ostomy output around 1300cc/24u - continue immodium, lomotil and add BID  fiber - Continue drain, due to feculent nature - continue medical treatment for C.diff per ID - mobilize - WOC following for new ileostomy; midline wound completely healed.  No further dressing changes needed - CCS will continue to see the patient once weekly while she is admitted.   FEN: reg diet VTE: Lovenox 60 mg BID- acute DVT noted in BUE 10/19 ID: extended course of vanc per ID.  - per TRH -  Acute DVT/PEs - [place] 10/19 with L brachial DVT and R internal jugular and brachial DVTs  Splenic infarcts - nothing surgical recommended at this time but agree with ID need to eval etiology of emboli  COPD HTN Chronic pain  Collagen vascular disease T2DM Seizure disorder  LOS: 46 days    Letha Cape, PA-C  Trauma & General Surgery Use [email or web address] to contact on call provider  03/14/2023

## 2023-03-14 NOTE — Progress Notes (Signed)
Mobility Specialist Progress Note:   03/14/23 1235  Mobility  Activity Ambulated with assistance in room  Level of Assistance Standby assist, set-up cues, supervision of patient - no hands on  Assistive Device  (IV Pole)  Distance Ambulated (ft) 120 ft  Activity Response Tolerated well  Mobility Referral Yes  $Mobility charge 1 Mobility  Mobility Specialist Start Time (ACUTE ONLY) 1220  Mobility Specialist Stop Time (ACUTE ONLY) 1235  Mobility Specialist Time Calculation (min) (ACUTE ONLY) 15 min   Pre Mobility: 103 HR During Mobility: 123 HR  Post Mobility: 107 HR   Pt received in bed, declining hallway ambulation but willing to walk increased distance in room. CG to stand. SB during ambulation. Pt c/o general weakness towards EOS, otherwise asymptomatic throughout. Pt left EOB to eat lunch with call bell in reach and all needs met.   Leory Plowman  Mobility Specialist Please contact via [employer] office at [phone]

## 2023-03-14 NOTE — TOC Progression Note (Signed)
Transition of Care [facility]) - Progression Note    Patient Details  Name: Latiqua Grandpre MRN: [id number] Date of Birth: [birth year]  Transition of Care [facility]) CM/SW Contact  Mearl Latin, LCSW Phone Number: 03/14/2023, 9:20 AM  Clinical Narrative:    CSW received notification from [facility] that they have received insurance approval for patient. Will continue to follow.    Expected Discharge Plan: Skilled Nursing Facility Barriers to Discharge: Continued Medical Work up, SNF Pending bed offer, English as a second language teacher  Expected Discharge Plan and Services In-house Referral: Clinical Social Work Discharge Planning Services: CM Consult Post Acute Care Choice: Skilled Nursing Facility Living arrangements for the past 2 months: Single Family Home                                       Social Determinants of Health (SDOH) Interventions SDOH Screenings   Food Insecurity: No Food Insecurity (03/05/2023)  Housing: Low Risk  (03/05/2023)  Transportation Needs: No Transportation Needs (03/05/2023)  Utilities: Not At Risk (03/05/2023)  Financial Resource Strain: Medium Risk (10/18/2022)   Received from [facility]  Social Connections: Unknown (09/01/2021)   Received from [facility], [facility]  Stress: Stress Concern Present (06/25/2020)   Received from [facility]  Tobacco Use: High Risk (02/18/2023)    Readmission Risk Interventions     No data to display

## 2023-03-14 NOTE — Plan of Care (Signed)
  Problem: Education: Goal: Knowledge of General Education information will improve Description Including pain rating scale, medication(s)/side effects and non-pharmacologic comfort measures Outcome: Progressing   Problem: Health Behavior/Discharge Planning: Goal: Ability to manage health-related needs will improve Outcome: Progressing   

## 2023-03-14 NOTE — Plan of Care (Signed)

## 2023-03-14 NOTE — Progress Notes (Signed)
PROGRESS NOTE [name]  ZOX:[id number] DOB: 1970-11-26 DOA: 01/27/2023 PCP: [name], PA-C  Brief Narrative/Hospital Course: 56 yom w/ COPD, Crohn's on Skyrizi, hypertension, type 2 diabetes mellitus who initially presented to the after being found unresponsive. Per family, patient has had bloody emesis with diarrhea over the past few days. Initial systolics in the 60s in the ED, and patient was minimally responsive. In the emergency room, patient was intubated and admitted to the ICU for septic shock and encephalopathy on 01/27/2023.  For further evaluation and management for shock and altered mental status.  She was  found to have  C. difficile colitis, intra-abdominal infection with abscess and fungemia. ID consulted and started on prolonged p.o. vancomycin with taper and micafungin in addition to IV antibiotics for intra-abdominal abscess.  Eventually, she came off vasopressors on 9/29 and extubated on 10/2 and transferred to floor on 02/03/23.  She is with ongoing diarrhea and had repeat proctosigmoidoscopy that showed severe inflammation likely from underlying Crohn's disease, active C. difficile infection and possible ischemic colitis from prior hypotension on presentation. 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 She became tachycardic with hypotension and lethargy and transferred back to ICU on 02/13/23 Blood culture on 10/15 grew Candida glabrata for which she was started on micafungin. CT abdomen and pelvis on 10/16>>concerning for incidental small PE to RLL pulmonary artery branch raising concern for septic emboli. S/p TEE on 10/18 that was negative for endocarditis. S/p antibiotics course and p.o. vancomycin and on micafungin for fungemia for a total x 6 weeks from 10/16 as per ID  In regards to abdominal wound, wound VAC removed.  Surgery recommended  wet-to-dry dressing.  Surgery following weekly. Ptot Recommended CIR but turned down by CIR due lack of 24/7 supervision and need for prolonged rehabilitation. Plan is discharge to SNF once tachycardia and diarrhea fairly controlled AND titrating metoprolol added ivabradine on 03/09/23 Weaning of IV pain medication.    Subjective: Patient seen examined this morning Reports ongoing pain medication trying to stay away from IV pain meds Colostomy bag came out-wound care team seeing   Assessment and Plan: Principal Problem:   Fungemia Active Problems:   Metabolic encephalopathy   Shock (HCC)   Cardiac arrest (HCC)   Acute respiratory failure with hypoxia (HCC)   Upper GI bleed   Granulomatous colitis (HCC)   Pseudomembranous colitis   Crohn's disease without complication (HCC)   Ileus (HCC)   C. difficile diarrhea   Sepsis (HCC)   Pressure injury of skin   Hyponatremia   Severe protein-calorie malnutrition (HCC)   ABLA (acute blood loss anemia)   Pulmonary embolus (HCC)   Splenic infarct   Bandemia  Septic shock multifactorial Intra-abdominal abscess Fungemia s/s Candida glabrata Septic emboli: Septic shock multifactorial due to C. difficile colitis fungemia and intramural abscess.  Sepsis physiology resolved. S/p laparotomy with total colectomy with ileostomy and LLQ JP drain in [facility]- ccs, wound care managing TTE/TEE negative for vegetation bubble study negative seen by ID ID recommended: 6 wks of antifungal from 02/16/23>>, 4 days of p.o. vancomycin followed by taper for C. Difficile-completed. Seen by ophthalmology 10/23 no fungal infiltrate on funduscopic exam Left subclavian tunneled CVC placed 02/21/2023 with IR.  Afebrile and leukocytosis resolved to 9.1   Abdominal pain Abdominal wound: Patient has been on IV Dilaudid and weaning off to oral agent and patch.  Continue to adjust, cont antiemetics  Cont wound care   Left-sided chest pain: EKG and troponin  negative.resolved  Headache-Attributing to oxycodone-stopped.Resolved.  Inadequate oral intake: Augment nutritional status  Postoperative short bowel syndrome/high output ostomy Malabsorption likely acute on chronic: CCS following.  Continue lamotrigine 4 times daily, ensure, Imodium 4 times daily Encourage oral intake and augment. Not a candidate for TPN due to fungemia   Sinus tachycardia: In the setting of acute illness possible dehydration, TSH stable. He had some pericardial effusion without tamponade.  Repeat CT on 10/25 did not reveal pericardial effusion.  HR stable on ivabradine and metoprolol  Left-sided pleural effusion/iatrogenic pneumothorax, small:  s/p thoracocentesis on 10/27.Fluid is exudative by LDH.  Fluid culture NGTD.  Subsequent chest x-ray on 10/28 without pneumothorax.  Cytology negative.  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 Cardell Peach with urology who recommended outpatient evaluation with cystoscopy   Acute pulmonary emboli/right upper extremity DVT: On Lovenox we will switch to DOAC   Thrombocytosis: Multifactorial. Evaluated by hematology and felt to be inflammatory response.  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  Continue to monitor and outpatient follow-up   Longstanding Crohn's disease: Not chronically on steroids so less concern for adrenal insufficiency. S/p colectomy. Steroids/Skyrizi held in the setting of infection.   Pericardial effusion without tamponade: Initially seen on TTE.  Was not seen on CTA on 10/25 . May need outpatient echocardiogram to confirm resolution.   Cardiac arrest secondary to septic shock requiring CPR on admission. stable   Type 2 diabetes mellitus with hyperglycemia  A1c 5.8  cbg stable   Seizure disorder history:  Continue home Keppra/gabapentin   Anxiety disorder: Continue home Xanax,  Cymbalta, hydroxyzine .resume home meds for insomnia   Hypertension: BP stable continue metoprolol   History of COPD: Not in exacerbation, continue Yupelri daily and xopenex PRN   GERD KEEP PPI   Normocytic Anemia: Stable Stable monitor and transfuse if less than 7 mg    Hypokalemia/hypomagnesemia Monitor and replete    Inadequate oral intake/weight loss: Lost about 30 pounds so far.  DVT prophylaxis: SCDs Start: 01/27/23 1111 Code Status:   Code Status: Full Code Family Communication: plan of care discussed with patient at bedside. Patient status is: Inpatient because of fungemia Level of care: Telemetry Cardiac   Dispo: The patient is from: home            Anticipated disposition: TBD Objective: Vitals last 24 hrs: Vitals:   03/13/23 2343 03/14/23 0509 03/14/23 0823 03/14/23 0933  BP:  (!) 112/45  (!) 126/46  Pulse: 88 82  (!) 110  Resp:  12  16  Temp: 98.5 F (36.9 C) 97.9 F (36.6 C)  98.1 F (36.7 C)  TempSrc: Oral Oral  Oral  SpO2: 99% 97% 98% 99%  Weight:  60.7 kg    Height:       Weight change: 1.188 kg  Physical Examination: General exam: alert awake, older than stated age HEENT:Oral mucosa moist, Ear/Nose WNL grossly Respiratory system: bilaterally clear BS, no use of accessory muscle Cardiovascular system: S1 & S2 +, No JVD. Gastrointestinal system: Abdomen soft, colostomy in place, midline incision site healed ,ND, BS+ Nervous System:Alert, awake, moving extremities. Extremities: LE edema neg,distal peripheral pulses palpable.  Skin: No rashes,no icterus. MSK: Normal muscle bulk,tone, power  Medications reviewed:  Scheduled Meds:  acetaminophen  650 mg Oral Q6H WA   alteplase  2 mg Intracatheter  Once   alteplase  2 mg Intracatheter Once   amitriptyline  25 mg Oral QHS   Chlorhexidine Gluconate Cloth  6 each Topical Daily   diphenoxylate-atropine  1 tablet Oral QID   DULoxetine  30 mg Oral Daily   enoxaparin (LOVENOX) injection  60 mg  Subcutaneous Q12H   feeding supplement  237 mL Oral TID BM   fentaNYL  1 patch Transdermal Q72H   folic acid  1 mg Oral Daily   gabapentin  100 mg Oral TID   Gerhardt's butt cream   Topical BID   ivabradine  5 mg Oral BID WC   levETIRAcetam  750 mg Oral BID   lidocaine  2 patch Transdermal Q24H   lidocaine (PF)  8 mL Intradermal Once   loperamide  4 mg Oral QID   loratadine  10 mg Oral Daily   methocarbamol  1,000 mg Oral TID   metoprolol tartrate  62.5 mg Oral BID   multivitamin with minerals  1 tablet Oral Daily   pantoprazole  40 mg Oral Daily   polycarbophil  1,250 mg Oral BID   revefenacin  175 mcg Nebulization Daily   sodium chloride flush  10-40 mL Intracatheter Q12H   thiamine  100 mg Oral Daily   vancomycin  125 mg Oral Daily   Followed by   Melene Muller ON 03/20/2023] vancomycin  125 mg Oral QODAY   Followed by   Melene Muller ON 03/28/2023] vancomycin  125 mg Oral Q3 days   Continuous Infusions:  micafungin (MYCAMINE) 150 mg in sodium chloride 0.9 % 100 mL IVPB 150 mg (03/14/23 1015)    Diet Order             Diet regular Room service appropriate? Yes; Fluid consistency: Thin  Diet effective now                  Intake/Output Summary (Last 24 hours) at 03/14/2023 1027 Last data filed at 03/14/2023 0510 Gross per 24 hour  Intake 36.26 ml  Output 2580 ml  Net -2543.74 ml  Net IO Since Admission: -28,500.95 mL [03/14/23 1027]  Wt Readings from Last 3 Encounters:  03/14/23 60.7 kg  11/13/22 63.5 kg  03/11/22 58.5 kg   Unresulted Labs (From admission, onward)     Start     Ordered   03/14/23 0500  CBC with Differential/Platelet  Tomorrow morning,   R       Question:  Specimen collection method  Answer:  Unit=Unit collect   03/13/23 0859   03/14/23 0500  Basic metabolic panel  Tomorrow morning,   R       Question:  Specimen collection method  Answer:  Unit=Unit collect   03/13/23 1755          Data Reviewed: I have personally reviewed following labs and  imaging studies CBC: Recent Labs  Lab 03/10/23 0220 03/11/23 0555 03/12/23 0640 03/13/23 1156  WBC 11.0* 10.8* 9.5 11.3*  NEUTROABS  --   --   --  8.2*  HGB 11.0* 11.5* 9.9* 10.6*  HCT 34.3* 35.5* 32.0* 33.0*  MCV 86.6 87.9 87.7 88.0  PLT 961* 949* 873* 775*  Basic Metabolic Panel: Recent Labs  Lab 03/10/23 0220 03/11/23 0555 03/12/23 0640 03/13/23 1156  NA 127* 126* 132*  --   K 3.7 4.1 3.8  --   CL 90* 91* 101  --   CO2 25 25 23   --   GLUCOSE 94 92 93  --  BUN 10 12 8   --   CREATININE 0.85 0.86 0.70  --   CALCIUM 10.2 10.0 9.2  --   MG 1.3* 2.0 1.5* 1.5*  PHOS 3.8 3.7 3.5  --   ZOX:[id number] Creatinine Clearance: 67.3 mL/min (by C-G formula based on SCr of 0.7 mg/dL). Liver Function Tests: Recent Labs  Lab 03/10/23 0220 03/11/23 0555 03/12/23 0640  ALBUMIN 3.0* 3.0* 2.7*   Recent Labs  Lab 03/07/23 1640 03/07/23 2047 03/08/23 0556 03/13/23 1210 03/13/23 1734  GLUCAP 102* 102* 102* 95 140*  No results found for this or any previous visit (from the past 240 hour(s)).  Antimicrobials: Anti-infectives (From admission, onward)    Start     Dose/Rate Route Frequency Ordered Stop   03/28/23 1000  vancomycin (VANCOCIN) capsule 125 mg       Placed in "Followed by" Linked Group   125 mg Oral Every 3 DAYS 03/01/23 0750 04/06/23 0959   03/20/23 1000  vancomycin (VANCOCIN) capsule 125 mg       Placed in "Followed by" Linked Group   125 mg Oral Every other day 03/01/23 0750 03/28/23 0959   03/13/23 1000  vancomycin (VANCOCIN) capsule 125 mg       Placed in "Followed by" Linked Group   125 mg Oral Daily 03/01/23 0750 03/20/23 0959   03/06/23 1000  vancomycin (VANCOCIN) capsule 125 mg       Placed in "Followed by" Linked Group   125 mg Oral 2 times daily 03/01/23 0750 03/12/23 2303   03/01/23 1000  vancomycin (VANCOCIN) capsule 125 mg       Placed in "Followed by" Linked Group   125 mg Oral 4 times daily 03/01/23 0750 03/05/23 2134   02/18/23 1800  vancomycin  (VANCOCIN) 50 mg/mL oral solution SOLN 125 mg        125 mg Oral 4 times daily 02/18/23 1439 02/26/23 0959   02/18/23 0400  micafungin (MYCAMINE) 150 mg in sodium chloride 0.9 % 100 mL IVPB        150 mg 107.5 mL/hr over 1 Hours Intravenous Every 24 hours 02/17/23 0936 03/30/23 2359   02/17/23 0900  vancomycin (VANCOREADY) IVPB 750 mg/150 mL  Status:  Discontinued        750 mg 150 mL/hr over 60 Minutes Intravenous Every 8 hours 02/17/23 0204 02/17/23 0947   02/17/23 0400  micafungin (MYCAMINE) 100 mg in sodium chloride 0.9 % 100 mL IVPB  Status:  Discontinued        100 mg 105 mL/hr over 1 Hours Intravenous Every 24 hours 02/16/23 0353 02/17/23 0936   02/16/23 1045  vancomycin (VANCOCIN) 50 mg/mL oral solution SOLN 125 mg  Status:  Discontinued        125 mg Per Tube 4 times daily 02/16/23 0953 02/18/23 1439   02/16/23 0415  micafungin (MYCAMINE) 100 mg in sodium chloride 0.9 % 100 mL IVPB        100 mg 105 mL/hr over 1 Hours Intravenous STAT 02/16/23 0353 02/16/23 0610   02/15/23 1300  vancomycin (VANCOREADY) IVPB 750 mg/150 mL  Status:  Discontinued        750 mg 150 mL/hr over 60 Minutes Intravenous Every 12 hours 02/14/23 1156 02/17/23 0204   02/14/23 1130  vancomycin (VANCOCIN) IVPB 1000 mg/200 mL premix        1,000 mg 200 mL/hr over 60 Minutes Intravenous  Once 02/14/23 1043 02/14/23 1249   02/11/23 2000  cefTRIAXone (ROCEPHIN) 2 g in sodium  chloride 0.9 % 100 mL IVPB  Status:  Discontinued       Note to Pharmacy: Pharmacy may adjust dosing strength / duration / interval for maximal efficacy   2 g 200 mL/hr over 30 Minutes Intravenous Every 24 hours 02/11/23 1622 02/28/23 0852   02/10/23 1600  metroNIDAZOLE (FLAGYL) IVPB 500 mg  Status:  Discontinued        500 mg 100 mL/hr over 60 Minutes Intravenous Every 12 hours 02/10/23 1444 02/28/23 0857   02/08/23 1800  vancomycin (VANCOCIN) 50 mg/mL oral solution SOLN 500 mg        500 mg Per Tube Every 6 hours 02/08/23 1325 02/12/23  1759   02/03/23 2200  metroNIDAZOLE (FLAGYL) tablet 500 mg        500 mg Per Tube Every 12 hours 02/03/23 1406 02/06/23 2148   02/02/23 1815  vancomycin (VANCOCIN) 50 mg/mL oral solution SOLN 125 mg  Status:  Discontinued        125 mg Per Tube Every 6 hours 02/02/23 1717 02/08/23 1325   02/02/23 1545  vancomycin (VANCOCIN) capsule 125 mg  Status:  Discontinued        125 mg Oral 4 times daily 02/02/23 1530 02/02/23 1717   01/27/23 1430  cefTRIAXone (ROCEPHIN) 2 g in sodium chloride 0.9 % 100 mL IVPB        2 g 200 mL/hr over 30 Minutes Intravenous Every 24 hours 01/27/23 1340 02/05/23 1403   01/27/23 1400  metroNIDAZOLE (FLAGYL) IVPB 500 mg  Status:  Discontinued        500 mg 100 mL/hr over 60 Minutes Intravenous Every 12 hours 01/27/23 1340 02/03/23 1406   01/27/23 0545  vancomycin (VANCOREADY) IVPB 1250 mg/250 mL        1,250 mg 166.7 mL/hr over 90 Minutes Intravenous  Once 01/27/23 0542 01/27/23 0809   01/27/23 0545  ceFEPIme (MAXIPIME) 2 g in sodium chloride 0.9 % 100 mL IVPB        2 g 200 mL/hr over 30 Minutes Intravenous  Once 01/27/23 0542 01/27/23 0621   01/27/23 0545  metroNIDAZOLE (FLAGYL) IVPB 500 mg        500 mg 100 mL/hr over 60 Minutes Intravenous  Once 01/27/23 0542 01/27/23 0741      Culture/Microbiology    Component Value Date/Time   SDES PLEURAL 02/27/2023 1300   SDES PLEURAL 02/27/2023 1300   SPECREQUEST LEFT 02/27/2023 1300   SPECREQUEST LEFT 02/27/2023 1300   CULT  02/27/2023 1300    NO GROWTH 5 DAYS Performed at [facility], [address], [place], [place] [address]    REPTSTATUS 03/04/2023 FINAL 02/27/2023 1300   REPTSTATUS 02/27/2023 FINAL 02/27/2023 1300    Radiology Studies: No results found.   LOS: 46 days   Lanae Boast, MD [facility]  03/14/2023, 10:27 AM

## 2023-03-14 NOTE — Progress Notes (Signed)
Physical Therapy Treatment Patient Details Name: [name] MRN: [id number] DOB: 21-Mar-1971 Today's Date: 03/14/2023   History of Present Illness 52 y.o. female admitted on 01/27/2023 after being found unresponsive at home down in a pool of bloody vomit/stool. CPR was initiated upon arrival to ED, pt intubated and started on pressors. Pt undergoing management for inflammatory vs infections colitis. Pt extubated on 10/2. 10/10 underwent repeat proctosigmoidoscopy for ongoing diarrhea. Diffuse severe inflammation identified due to combination of Crohn's disease, active C. difficile infection and possible ischemic colitis from prior hypotension on presentation. 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 10/13 became tachycardic and hypotensive.  More lethargic and transferred back to ICU. On 10/15, blood cultures revealed fungemia. On 10/17, CT of abdomen and pelvis notable for septic emboli. Successful Left subclavian CVC placement with IR on 10/21. PMH includes COPD, Crohn's disease, HTN, DMII.    PT Comments  The pt was agreeable to limited session, reports great progress with ambulation this morning with mobility specialist and is feeling fatigued from that exertion. Pt encouraged that with continued recovery she will need to progress to multiple bouts of activity each day, agreeable to session with focus on LE strengthening. The pt was able to demo good technique and tolerance of resistance for LE exercises, verbalized understanding and ability to complete additional sets outside of therapy sessions today and in future. Will continue to benefit from skilled PT to progress endurance, stability, and strength prior to d/c.     If plan is discharge home, recommend the following: Assistance with cooking/housework;Assist for transportation;Direct supervision/assist for financial management;Help with stairs or ramp for entrance;A little help with bathing/dressing/bathroom;A little help with  walking and/or transfers   Can travel by private vehicle     No  Equipment Recommendations  Rolling walker (2 wheels)    Recommendations for Other Services       Precautions / Restrictions Precautions Precautions: Fall Precaution Comments: watch HR/RR, L JP drain, new colostomy Restrictions Weight Bearing Restrictions: No     Mobility  Bed Mobility Overal bed mobility: Needs Assistance Bed Mobility: Sit to Supine       Sit to supine: Min assist, HOB elevated   General bed mobility comments: minA to reposition, pt able to complete most movements on her own slowly, assist for pain management    Transfers Overall transfer level: Needs assistance Equipment used: None Transfers: Sit to/from Stand Sit to Stand: Min assist           General transfer comment: minA to rise and steady, able to stand briefly without UE support. pt limited by fatigue    Ambulation/Gait               General Gait Details: pt declined due to fatigue from mobility earlier in day      Balance Overall balance assessment: Needs assistance Sitting-balance support: Feet supported, No upper extremity supported Sitting balance-Leahy Scale: Good Sitting balance - Comments: sitting EOB   Standing balance support: No upper extremity supported, During functional activity Standing balance-Leahy Scale: Poor Standing balance comment: needed single UE support for mobility                            Cognition Arousal: Alert Behavior During Therapy: WFL for tasks assessed/performed Overall Cognitive Status: Within Functional Limits for tasks assessed  General Comments: tangential, but able to follow all cues and instructions        Exercises General Exercises - Lower Extremity Gluteal Sets: AROM, Both, 15 reps Long Arc Quad: AROM, Strengthening, Both, 10 reps, Other (comment) (pt with legs crossed to add resistance) Hip  Flexion/Marching: AROM, Both, 10 reps, Seated Heel Raises: Strengthening, Both, 10 reps, Seated (with legs crossed to add resistance) Other Exercises Other Exercises: seated cross-body reaches Other Exercises: hip adductor squeezes x 10 w/ 5 sec hold    General Comments General comments (skin integrity, edema, etc.): VSS on RA this session      Pertinent Vitals/Pain Pain Assessment Pain Assessment: Faces Faces Pain Scale: Hurts even more Pain Location: R side of abdomen Pain Descriptors / Indicators: Guarding, Discomfort, Grimacing Pain Intervention(s): Limited activity within patient's tolerance, Monitored during session, Repositioned    PT Goals (current goals can now be found in the care plan section) Acute Rehab PT Goals Patient Stated Goal: home PT Goal Formulation: With patient Time For Goal Achievement: 03/16/23 Potential to Achieve Goals: Good Progress towards PT goals: Progressing toward goals    Frequency    Min 1X/week       AM-PAC PT "6 Clicks" Mobility   Outcome Measure  Help needed turning from your back to your side while in a flat bed without using bedrails?: A Little Help needed moving from lying on your back to sitting on the side of a flat bed without using bedrails?: A Little Help needed moving to and from a bed to a chair (including a wheelchair)?: A Little Help needed standing up from a chair using your arms (e.g., wheelchair or bedside chair)?: A Little Help needed to walk in hospital room?: A Little Help needed climbing 3-5 steps with a railing? : Total 6 Click Score: 16    End of Session   Activity Tolerance: Patient limited by fatigue Patient left: in bed;with call bell/phone within reach Nurse Communication: Mobility status PT Visit Diagnosis: Other abnormalities of gait and mobility (R26.89);Muscle weakness (generalized) (M62.81)     Time: 1610-9604 PT Time Calculation (min) (ACUTE ONLY): 39 min  Charges:    $Therapeutic  Exercise: 23-37 mins $Therapeutic Activity: 8-22 mins PT General Charges $$ ACUTE PT VISIT: 1 Visit                     Vickki Muff, PT, DPT   Acute Rehabilitation Department Office [phone] Secure Chat Communication Preferred   Ronnie Derby 03/14/2023, 3:31 PM

## 2023-03-14 NOTE — Consult Note (Signed)
WOC Nurse ostomy follow up Stoma type/location: RMQ ileostomy   Stomal assessment/size: 1 " pink and moist, slightly budded Peristomal assessment: stomal separation at 9 o'clock and has hypergranulated due to leaks and exposure to effluent.  Treatment options for stomal/peristomal skin: barrier ring and switching to 1 piece filtered convex pouch Output liquid brown stool Ostomy pouching: 1pc. Convex with barrier ring  Education provided: performed pouch change with patient assist.  She cut barrier to fit, applied barrier ring and I assisted with pouch application. She can roll open and closed  We discussed emptying in bathroom sitting on toilet. She agrees to try this with staff assistance.  We discuss twice weekly pouch changes and she will perform again on Thursday. Enrolled patient in [facility] [employer] Discharge program: Yes Will follow.  Mike Gip MSN, RN, FNP-BC CWON Wound, Ostomy, Continence Nurse Outpatient [facility] [phone] Pager [phone]

## 2023-03-14 NOTE — TOC Progression Note (Signed)
Transition of Care [facility]) - Progression Note    Patient Details  Name: Maybellene Famularo MRN: [id number] Date of Birth: Sep 08, 1970  [facility] CM/SW Contact  Delilah Shan, LCSWA Phone Number: 03/14/2023, 3:37 PM  Clinical Narrative:     CSW spoke with Jill Side with [facility] who confirmed patient received insurance authorization approval. Patient has SN[facility] bed at [facility] when medically ready for dc.  Expected Discharge Plan: Skilled Nursing Facility Barriers to Discharge: Continued Medical Work up, SNF Pending bed offer, English as a second language teacher  Expected Discharge Plan and Services In-house Referral: Clinical Social Work Discharge Planning Services: CM Consult Post Acute Care Choice: Skilled Nursing Facility Living arrangements for the past 2 months: Single Family Home                                       Social Determinants of Health (SDOH) Interventions SDOH Screenings   Food Insecurity: No Food Insecurity (03/05/2023)  Housing: Low Risk  (03/05/2023)  Transportation Needs: No Transportation Needs (03/05/2023)  Utilities: Not At Risk (03/05/2023)  Financial Resource Strain: Medium Risk (10/18/2022)   Received from [facility]  Social Connections: Unknown (09/01/2021)   Received from [facility], [facility]  Stress: Stress Concern Present (06/25/2020)   Received from [facility], [facility]  Tobacco Use: High Risk (02/18/2023)    Readmission Risk Interventions     No data to display

## 2023-03-14 NOTE — Hospital Course (Addendum)
51 yom w/ COPD, Crohn's on [place]ri[place], hypertension, type 2 diabetes mellitus who initially presented to the after being found unresponsive. Per family, patient has had bloody emesis with diarrhea over the past few days. Initial systolics in the 60s in the ED, and patient was minimally responsive. In the emergency room, patient was intubated and admitted to the ICU for septic shock and encephalopathy on 01/27/2023.  For further evaluation and management for shock and altered mental status.  She was  found to have  C. difficile colitis, intra-abdominal infection with abscess and fungemia. ID consulted and started on prolonged p.o. vancomycin with taper and micafungin in addition to IV antibiotics for intra-abdominal abscess.  Eventually, she came off vasopressors on 9/29 and extubated on 10/2 and transferred to floor on 02/03/23.  She is with ongoing diarrhea and had repeat proctosigmoidoscopy that showed severe inflammation likely from underlying Crohn's disease, active C. difficile infection and possible ischemic colitis from prior hypotension on presentation. 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 She became tachycardic with hypotension and lethargy and transferred back to ICU on 02/13/23 Blood culture on 10/15 grew Candida glabrata for which she was started on micafungin. CT abdomen and pelvis on 10/16>>concerning for incidental small PE to RLL pulmonary artery branch raising concern for septic emboli. S/p TEE on 10/18 that was negative for endocarditis. S/p antibiotics course and p.o. vancomycin and on micafungin for fungemia for a total x 6 weeks from 10/16 as per ID  In regards to abdominal wound, wound VAC removed.  Surgery recommended wet-to-dry dressing.  Surgery following weekly. Ptot Recommended CIR but turned down by CIR due lack of 24/7 supervision and need for prolonged  rehabilitation. Plan is discharge to SNF once tachycardia and diarrhea fairly controlled AND titrating metoprolol added ivabradine on 03/09/23. Weaning of IV pain medication. ID has advised antibiotic with PICC line maintain Vanco trough 15-20 or AUC 400-5 50 duration 6 weeks end of treatment 03/30/2023. Fu w. Dr Daiva Eves scheduled on 03/29/23 at 10 am. Patient at this time wants to return with her daughter and manage antibiotics antifungal at home.

## 2023-03-14 NOTE — Discharge Instructions (Addendum)
[facility]      [facility], [place] [phone]  OPEN ABDOMINAL SURGERY: POST OP INSTRUCTIONS  Always review your discharge instruction sheet given to you by the facility where your surgery was performed.  IF YOU HAVE DISABILITY OR FAMILY LEAVE FORMS, YOU MUST BRING THEM TO THE OFFICE FOR PROCESSING.  PLEASE DO NOT GIVE THEM TO YOUR DOCTOR.  A prescription for pain medication may be given to you upon discharge.  Take your pain medication as prescribed, if needed.  If narcotic pain medicine is not needed, then you may take acetaminophen (Tylenol) or ibuprofen (Advil) as needed. Take your usually prescribed medications unless otherwise directed. If you need a refill on your pain medication, please contact your pharmacy. They will contact our office to request authorization.  Prescriptions will not be filled after 5pm or on week-ends. You should follow a light diet the first few days after arrival home, such as soup and crackers, pudding, etc.unless your doctor has advised otherwise. A high-fiber, low fat diet can be resumed as tolerated.   Be sure to include lots of fluids daily. Most patients will experience some swelling and bruising on the chest and neck area.  Ice packs will help.  Swelling and bruising can take several days to resolve Most patients will experience some swelling and bruising in the area of the incision. Ice pack will help. Swelling and bruising can take several days to resolve..  It is common to experience some constipation if taking pain medication after surgery.  Increasing fluid intake and taking a stool softener will usually help or prevent this problem from occurring.  A mild laxative (Milk of Magnesia or Miralax) should be taken according to package directions if there are no bowel movements after 48 hours.  You may have steri-strips (small skin tapes) in place directly over the incision.  These strips should be left on the skin for 7-10 days.  If your surgeon used skin  glue on the incision, you may shower in 24 hours.  The glue will flake off over the next 2-3 weeks.  Any sutures or staples will be removed at the office during your follow-up visit. You may find that a light gauze bandage over your incision may keep your staples from being rubbed or pulled. You may shower and replace the bandage daily. ACTIVITIES:  You may resume regular (light) daily activities beginning the next day--such as daily self-care, walking, climbing stairs--gradually increasing activities as tolerated.  You may have sexual intercourse when it is comfortable.  Refrain from any heavy lifting or straining until approved by your doctor. You may drive when you no longer are taking prescription pain medication, you can comfortably wear a seatbelt, and you can safely maneuver your car and apply brakes Return to Work: ___________________________________ [name] should see your doctor in the office for a follow-up appointment approximately two weeks after your surgery.  Make sure that you call for this appointment within a day or two after you arrive home to insure a convenient appointment time. OTHER INSTRUCTIONS:  _____________________________________________________________ _____________________________________________________________  WHEN TO CALL YOUR DOCTOR: Fever over 101.0 Inability to urinate Nausea and/or vomiting Extreme swelling or bruising Continued bleeding from incision. Increased pain, redness, or drainage from the incision. Difficulty swallowing or breathing Muscle cramping or spasms. Numbness or tingling in hands or feet or around lips.  The clinic staff is available to answer your questions during regular business hours.  Please don't hesitate to call and ask to speak to one of  the nurses if you have concerns.  For further questions, please visit [email or web address]

## 2023-03-15 DIAGNOSIS — B49 Unspecified mycosis: Secondary | ICD-10-CM | POA: Diagnosis not present

## 2023-03-15 LAB — HEPATIC FUNCTION PANEL
ALT: 22 U/L (ref 0–44)
AST: 31 U/L (ref 15–41)
Albumin: 2.7 g/dL — ABNORMAL LOW (ref 3.5–5.0)
Alkaline Phosphatase: 183 U/L — ABNORMAL HIGH (ref 38–126)
Bilirubin, Direct: <0.1 mg/dL (ref 0.0–0.2)
Total Bilirubin: 0.3 mg/dL (ref <1.2)
Total Protein: 6.4 g/dL — ABNORMAL LOW (ref 6.5–8.1)

## 2023-03-15 LAB — CBC
HCT: 33.5 % — ABNORMAL LOW (ref 36.0–46.0)
Hemoglobin: 10.6 g/dL — ABNORMAL LOW (ref 12.0–15.0)
MCH: 27.5 pg (ref 26.0–34.0)
MCHC: 31.6 g/dL (ref 30.0–36.0)
MCV: 87 fL (ref 80.0–100.0)
Platelets: 677 10*3/uL — ABNORMAL HIGH (ref 150–400)
RBC: 3.85 MIL/uL — ABNORMAL LOW (ref 3.87–5.11)
RDW: 16.5 % — ABNORMAL HIGH (ref 11.5–15.5)
WBC: 9.4 10*3/uL (ref 4.0–10.5)
nRBC: 0 % (ref 0.0–0.2)

## 2023-03-15 LAB — BASIC METABOLIC PANEL
Anion gap: 10 (ref 5–15)
BUN: 6 mg/dL (ref 6–20)
CO2: 24 mmol/L (ref 22–32)
Calcium: 10.3 mg/dL (ref 8.9–10.3)
Chloride: 96 mmol/L — ABNORMAL LOW (ref 98–111)
Creatinine, Ser: 0.73 mg/dL (ref 0.44–1.00)
GFR, Estimated: >60 mL/min (ref >60)
Glucose, Bld: 91 mg/dL (ref 70–99)
Potassium: 4 mmol/L (ref 3.5–5.1)
Sodium: 130 mmol/L — ABNORMAL LOW (ref 135–145)

## 2023-03-15 LAB — GLUCOSE, CAPILLARY
Glucose-Capillary: 116 mg/dL — ABNORMAL HIGH (ref 70–99)
Glucose-Capillary: 95 mg/dL (ref 70–99)

## 2023-03-15 NOTE — Progress Notes (Signed)
    [facility]  Date of Admission:  01/27/2023     Diagnosis: Fungal endocarditis  Culture Result: Candida Glabrata  Allergies  Allergen Reactions   Codeine Shortness Of Breath, Swelling and Rash    Throat swelling   Hydrocodone Shortness Of Breath, Swelling and Rash   Ketorolac Nausea And Vomiting and Swelling    Pt reports n/v and swelling to throat   Morphine And Codeine Shortness Of Breath, Swelling and Rash   Penicillins Shortness Of Breath, Swelling and Other (See Comments)    Throat swells 02/06/21--TOLERATES CEFTRIAXONE     Nickel Rash   Pregabalin Other (See Comments)   Acetaminophen     Per MD patient states she can't take Tylenol because of liver enzymes   Atarax [Hydroxyzine] Itching and Swelling    Mildly swollen throat   Ativan [Lorazepam] Other (See Comments)    Migraines.   Strawberry Extract Swelling   Watermelon Concentrate Swelling   Albuterol Palpitations   Benadryl [Diphenhydramine Hcl] Palpitations   Humira [Adalimumab] Rash   Latex Rash   Remicade [Infliximab] Rash    Blisters and Welts    Tramadol Rash    Rash and itching    OPAT Orders Discharge antibiotics to be given via PICC line Discharge antibiotics: Micafungin Per pharmacy protocol  Aim for Vancomycin trough 15-20 or AUC 400-550 (unless otherwise indicated) Duration: 6 weeks  End Date: 03/30/23  [facility] Care Per Protocol:  Home health RN for IV administration and teaching; PICC line care and labs.    Labs weekly while on IV antibiotics: _X_ CBC with differential _X_ BMP __ CMP __ CRP __ ESR __ Vancomycin trough __ CK  __ Please pull PIC at completion of IV antibiotics _X_ Please leave PIC in place until doctor has seen patient or been notified  Fax weekly labs to [phone]  Clinic Follow Up Appt:  03/29/23 at 10 am with Dr. Daiva Eves If remains inpatient please contact [name] ID Physician   Marcos Eke, NP [facility]  03/15/2023  2:47 PM

## 2023-03-15 NOTE — Progress Notes (Signed)
PROGRESS NOTE [name]  QIH:[id number] DOB: 01-19-1971 DOA: 01/27/2023 PCP: [name], PA-C  Brief Narrative/Hospital Course: 57 yom w/ COPD, Crohn's on Skyrizi, hypertension, type 2 diabetes mellitus who initially presented to the after being found unresponsive. Per family, patient has had bloody emesis with diarrhea over the past few days. Initial systolics in the 60s in the ED, and patient was minimally responsive. In the emergency room, patient was intubated and admitted to the ICU for septic shock and encephalopathy on 01/27/2023.  For further evaluation and management for shock and altered mental status.  She was  found to have  C. difficile colitis, intra-abdominal infection with abscess and fungemia. ID consulted and started on prolonged p.o. vancomycin with taper and micafungin in addition to IV antibiotics for intra-abdominal abscess.  Eventually, she came off vasopressors on 9/29 and extubated on 10/2 and transferred to floor on 02/03/23.  She is with ongoing diarrhea and had repeat proctosigmoidoscopy that showed severe inflammation likely from underlying Crohn's disease, active C. difficile infection and possible ischemic colitis from prior hypotension on presentation. 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 She became tachycardic with hypotension and lethargy and transferred back to ICU on 02/13/23 Blood culture on 10/15 grew Candida glabrata for which she was started on micafungin. CT abdomen and pelvis on 10/16>>concerning for incidental small PE to RLL pulmonary artery branch raising concern for septic emboli. S/p TEE on 10/18 that was negative for endocarditis. S/p antibiotics course and p.o. vancomycin and on micafungin for fungemia for a total x 6 weeks from 10/16 as per ID  In regards to abdominal wound, wound VAC removed.  Surgery recommended  wet-to-dry dressing.  Surgery following weekly. Ptot Recommended CIR but turned down by CIR due lack of 24/7 supervision and need for prolonged rehabilitation. Plan is discharge to SNF once tachycardia and diarrhea fairly controlled AND titrating metoprolol added ivabradine on 03/09/23 Weaning of IV pain medication.    Subjective: Patient seen and examined this morning Overnight afebrile BP-90-100,  Reports pain is controlled  Labs with hyponatremia stable WBC AND HB thrombocytosis improving    Assessment and Plan: Principal Problem:   Fungemia Active Problems:   Metabolic encephalopathy   Shock (HCC)   Cardiac arrest (HCC)   Acute respiratory failure with hypoxia (HCC)   Upper GI bleed   Granulomatous colitis (HCC)   Pseudomembranous colitis   Crohn's disease without complication (HCC)   Ileus (HCC)   C. difficile diarrhea   Sepsis (HCC)   Pressure injury of skin   Hyponatremia   Severe protein-calorie malnutrition (HCC)   ABLA (acute blood loss anemia)   Pulmonary embolus (HCC)   Splenic infarct   Bandemia  Septic shock multifactorial Intra-abdominal abscess Fungemia 2/2 Candida glabrata Septic emboli: Septic shock multifactorial due to C. difficile colitis fungemia and intramural abscess- now resolved S/p laparotomy with total colectomy with ileostomy and LLQ JP drain in [facility], wound care following.   Work up negative for endocarditis in  TTE/TEE ID recommended: 6 wks of antifungal from 02/16/23>>, 4 days of p.o. vancomycin followed by taper for C. Difficile Seen by ophthalmology 10/23 no fungal infiltrate on funduscopic exam Left subclavian tunneled CVC placed 02/21/2023 with IR.  Afebrile and leukocytosis resolved to 9.1 Recent Labs  Lab 03/11/23 0555 03/12/23 0640 03/13/23 1156 03/14/23 0625 03/15/23 0440  WBC 10.8* 9.5 11.3* 9.1 9.4      Abdominal pain  Abdominal wound: Patient has been on IV Dilaudid and weaned off. Cont fentanyl patch,  Robaxin,antiemetics   Left-sided chest pain: EKG and troponin negative.resolved  Headache-Attributing to oxycodone-stopped.Resolved.  Inadequate oral intake: Augment nutritional status. RD following.  Postoperative short bowel syndrome/high output ostomy Malabsorption likely acute on chronic: Continue lomotil qid,Imodium qid. Encourage oral intake and augment. Not a candidate for TPN due to fungemia   Sinus tachycardia: In the setting of acute illness possible dehydration, TSH stable. He had some pericardial effusion without tamponade.  Repeat CT on 10/25 did not reveal pericardial effusion.  HR stable on ivabradine and metoprolol  Left-sided pleural effusion/iatrogenic pneumothorax, small:  s/p thoracocentesis on 10/27.Fluid is exudative by LDH.  Fluid culture NGTD.Subsequent chest x-ray on 10/28 without pneumothorax.  Cytology negative.  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 Cardell Peach with urology who recommended outpatient evaluation with cystoscopy   Acute pulmonary emboli/right upper extremity DVT: On Lovenox>switched to DOAC 11/12   Thrombocytosis: Multifactorial. Evaluated by hematology and felt to be inflammatory response.  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  Continue to monitor and outpatient follow-up. Improving   Longstanding Crohn's disease: Not chronically on steroids so less concern for adrenal insufficiency. S/p colectomy. Steroids/Skyrizi held in the setting of infection.   Pericardial effusion without tamponade: Initially seen on TTE.  Was not seen on CTA on 10/25 . May need outpatient echocardiogram to confirm resolution.   Cardiac arrest secondary to septic shock requiring CPR on admission. stable   Type 2 diabetes mellitus with hyperglycemia  A1c 5.8  cbg stable   Seizure disorder history:  Continue home  Keppra/gabapentin   Anxiety disorder: Continue home Xanax, Cymbalta, hydroxyzine .resume home meds for insomnia   Hypertension: BP stable continue metoprolol   History of COPD: Not in exacerbation, continue Yupelri daily and xopenex PRN   GERD KEEP PPI   Normocytic Anemia: Stable Stable monitor and transfuse if less than 7 mg    Hypokalemia/hypomagnesemia Monitor and replete    Inadequate oral intake/weight loss: Lost about 30 pounds so far.  DVT prophylaxis: SCDs Start: 01/27/23 1111 Code Status:   Code Status: Full Code Family Communication: plan of care discussed with patient at bedside. Patient status is: Inpatient because of fungemia Level of care: Telemetry Cardiac   Dispo: The patient is from: home            Anticipated disposition: SNF once available.    Objective: Vitals last 24 hrs: Vitals:   03/14/23 2000 03/15/23 0005 03/15/23 0431 03/15/23 0814  BP: 119/63 (!) 115/35 116/64 (!) 92/39  Pulse: (!) 108 89 89 (!) 108  Resp: 15 16 13 12   Temp: 97.8 F (36.6 C) 98.1 F (36.7 C) 97.7 F (36.5 C) 97.9 F (36.6 C)  TempSrc: Oral Oral Oral Oral  SpO2: 98% 97% 98% 98%  Weight:   59.4 kg   Height:       Weight change: -1.3 kg  Physical Examination: General exam: alert awake, oriented at baseline, older than stated age HEENT:Oral mucosa moist, Ear/Nose WNL grossly Respiratory system: Bilaterally clear BS,no use of accessory muscle Cardiovascular system: S1 & S2 +, No JVD. Gastrointestinal system: Abdomen soft, incision site healing, colostomy in place  Nervous System: Alert, awake, moving all extremities,and following commands. Extremities: LE edema neg,distal peripheral pulses palpable and warm.  Skin: No rashes,no icterus. MSK: Normal muscle bulk,tone, power   Medications  reviewed:  Scheduled Meds:  acetaminophen  650 mg Oral Q6H WA   amitriptyline  100 mg Oral QHS   apixaban  5 mg Oral BID   Chlorhexidine Gluconate Cloth  6 each Topical Daily    diphenoxylate-atropine  1 tablet Oral QID   DULoxetine  30 mg Oral Daily   feeding supplement  237 mL Oral TID BM   fentaNYL  1 patch Transdermal Q72H   folic acid  1 mg Oral Daily   gabapentin  100 mg Oral TID   Gerhardt's butt cream   Topical BID   ivabradine  5 mg Oral BID WC   levETIRAcetam  750 mg Oral BID   lidocaine  2 patch Transdermal Q24H   lidocaine (PF)  8 mL Intradermal Once   loperamide  4 mg Oral QID   loratadine  10 mg Oral Daily   methocarbamol  1,000 mg Oral TID   metoprolol tartrate  62.5 mg Oral BID   multivitamin with minerals  1 tablet Oral Daily   pantoprazole  40 mg Oral Daily   polycarbophil  1,250 mg Oral BID   revefenacin  175 mcg Nebulization Daily   sodium chloride flush  10-40 mL Intracatheter Q12H   thiamine  100 mg Oral Daily   vancomycin  125 mg Oral Daily   Followed by   Melene Muller ON 03/20/2023] vancomycin  125 mg Oral QODAY   Followed by   Melene Muller ON 03/28/2023] vancomycin  125 mg Oral Q3 days   Continuous Infusions:  micafungin (MYCAMINE) 150 mg in sodium chloride 0.9 % 100 mL IVPB Stopped (03/14/23 2110)    Diet Order             Diet regular Room service appropriate? Yes; Fluid consistency: Thin  Diet effective now                  Intake/Output Summary (Last 24 hours) at 03/15/2023 1007 Last data filed at 03/15/2023 0453 Gross per 24 hour  Intake --  Output 1740 ml  Net -1740 ml  Net IO Since Admission: -30,240.95 mL [03/15/23 1007]  Wt Readings from Last 3 Encounters:  03/15/23 59.4 kg  11/13/22 63.5 kg  03/11/22 58.5 kg   Unresulted Labs (From admission, onward)    None     Data Reviewed: I have personally reviewed following labs and imaging studies CBC: Recent Labs  Lab 03/11/23 0555 03/12/23 0640 03/13/23 1156 03/14/23 0625 03/15/23 0440  WBC 10.8* 9.5 11.3* 9.1 9.4  NEUTROABS  --   --  8.2* 4.8  --   HGB 11.5* 9.9* 10.6* 10.4* 10.6*  HCT 35.5* 32.0* 33.0* 34.1* 33.5*  MCV 87.9 87.7 88.0 89.5 87.0  PLT 949*  873* 775* 741* 677*  Basic Metabolic Panel: Recent Labs  Lab 03/10/23 0220 03/11/23 0555 03/12/23 0640 03/13/23 1156 03/14/23 0625 03/15/23 0440  NA 127* 126* 132*  --  132* 130*  K 3.7 4.1 3.8  --  4.0 4.0  CL 90* 91* 101  --  99 96*  CO2 25 25 23   --  22 24  GLUCOSE 94 92 93  --  73 91  BUN 10 12 8   --  6 6  CREATININE 0.85 0.86 0.70  --  0.65 0.73  CALCIUM 10.2 10.0 9.2  --  10.0 10.3  MG 1.3* 2.0 1.5* 1.5*  --   --   PHOS 3.8 3.7 3.5  --   --   --   HQI:[id number] Creatinine  Clearance: 67.3 mL/min (by C-G formula based on SCr of 0.73 mg/dL). Liver Function Tests: Recent Labs  Lab 03/10/23 0220 03/11/23 0555 03/12/23 0640 03/15/23 0440  AST  --   --   --  31  ALT  --   --   --  22  ALKPHOS  --   --   --  183*  BILITOT  --   --   --  0.3  PROT  --   --   --  6.4*  ALBUMIN 3.0* 3.0* 2.7* 2.7*   Recent Labs  Lab 03/13/23 1210 03/13/23 1734 03/14/23 1150 03/14/23 1652  GLUCAP 95 140* 108* 101*  No results found for this or any previous visit (from the past 240 hour(s)).  Antimicrobials: Anti-infectives (From admission, onward)    Start     Dose/Rate Route Frequency Ordered Stop   03/28/23 1000  vancomycin (VANCOCIN) capsule 125 mg       Placed in "Followed by" Linked Group   125 mg Oral Every 3 DAYS 03/01/23 0750 04/06/23 0959   03/20/23 1000  vancomycin (VANCOCIN) capsule 125 mg       Placed in "Followed by" Linked Group   125 mg Oral Every other day 03/01/23 0750 03/28/23 0959   03/13/23 1000  vancomycin (VANCOCIN) capsule 125 mg       Placed in "Followed by" Linked Group   125 mg Oral Daily 03/01/23 0750 03/20/23 0959   03/06/23 1000  vancomycin (VANCOCIN) capsule 125 mg       Placed in "Followed by" Linked Group   125 mg Oral 2 times daily 03/01/23 0750 03/12/23 2303   03/01/23 1000  vancomycin (VANCOCIN) capsule 125 mg       Placed in "Followed by" Linked Group   125 mg Oral 4 times daily 03/01/23 0750 03/05/23 2134   02/18/23 1800  vancomycin  (VANCOCIN) 50 mg/mL oral solution SOLN 125 mg        125 mg Oral 4 times daily 02/18/23 1439 02/26/23 0959   02/18/23 0400  micafungin (MYCAMINE) 150 mg in sodium chloride 0.9 % 100 mL IVPB        150 mg 107.5 mL/hr over 1 Hours Intravenous Every 24 hours 02/17/23 0936 03/30/23 2359   02/17/23 0900  vancomycin (VANCOREADY) IVPB 750 mg/150 mL  Status:  Discontinued        750 mg 150 mL/hr over 60 Minutes Intravenous Every 8 hours 02/17/23 0204 02/17/23 0947   02/17/23 0400  micafungin (MYCAMINE) 100 mg in sodium chloride 0.9 % 100 mL IVPB  Status:  Discontinued        100 mg 105 mL/hr over 1 Hours Intravenous Every 24 hours 02/16/23 0353 02/17/23 0936   02/16/23 1045  vancomycin (VANCOCIN) 50 mg/mL oral solution SOLN 125 mg  Status:  Discontinued        125 mg Per Tube 4 times daily 02/16/23 0953 02/18/23 1439   02/16/23 0415  micafungin (MYCAMINE) 100 mg in sodium chloride 0.9 % 100 mL IVPB        100 mg 105 mL/hr over 1 Hours Intravenous STAT 02/16/23 0353 02/16/23 0610   02/15/23 1300  vancomycin (VANCOREADY) IVPB 750 mg/150 mL  Status:  Discontinued        750 mg 150 mL/hr over 60 Minutes Intravenous Every 12 hours 02/14/23 1156 02/17/23 0204   02/14/23 1130  vancomycin (VANCOCIN) IVPB 1000 mg/200 mL premix        1,000 mg 200 mL/hr over  60 Minutes Intravenous  Once 02/14/23 1043 02/14/23 1249   02/11/23 2000  cefTRIAXone (ROCEPHIN) 2 g in sodium chloride 0.9 % 100 mL IVPB  Status:  Discontinued       Note to Pharmacy: Pharmacy may adjust dosing strength / duration / interval for maximal efficacy   2 g 200 mL/hr over 30 Minutes Intravenous Every 24 hours 02/11/23 1622 02/28/23 0852   02/10/23 1600  metroNIDAZOLE (FLAGYL) IVPB 500 mg  Status:  Discontinued        500 mg 100 mL/hr over 60 Minutes Intravenous Every 12 hours 02/10/23 1444 02/28/23 0857   02/08/23 1800  vancomycin (VANCOCIN) 50 mg/mL oral solution SOLN 500 mg        500 mg Per Tube Every 6 hours 02/08/23 1325 02/12/23  1759   02/03/23 2200  metroNIDAZOLE (FLAGYL) tablet 500 mg        500 mg Per Tube Every 12 hours 02/03/23 1406 02/06/23 2148   02/02/23 1815  vancomycin (VANCOCIN) 50 mg/mL oral solution SOLN 125 mg  Status:  Discontinued        125 mg Per Tube Every 6 hours 02/02/23 1717 02/08/23 1325   02/02/23 1545  vancomycin (VANCOCIN) capsule 125 mg  Status:  Discontinued        125 mg Oral 4 times daily 02/02/23 1530 02/02/23 1717   01/27/23 1430  cefTRIAXone (ROCEPHIN) 2 g in sodium chloride 0.9 % 100 mL IVPB        2 g 200 mL/hr over 30 Minutes Intravenous Every 24 hours 01/27/23 1340 02/05/23 1403   01/27/23 1400  metroNIDAZOLE (FLAGYL) IVPB 500 mg  Status:  Discontinued        500 mg 100 mL/hr over 60 Minutes Intravenous Every 12 hours 01/27/23 1340 02/03/23 1406   01/27/23 0545  vancomycin (VANCOREADY) IVPB 1250 mg/250 mL        1,250 mg 166.7 mL/hr over 90 Minutes Intravenous  Once 01/27/23 0542 01/27/23 0809   01/27/23 0545  ceFEPIme (MAXIPIME) 2 g in sodium chloride 0.9 % 100 mL IVPB        2 g 200 mL/hr over 30 Minutes Intravenous  Once 01/27/23 0542 01/27/23 0621   01/27/23 0545  metroNIDAZOLE (FLAGYL) IVPB 500 mg        500 mg 100 mL/hr over 60 Minutes Intravenous  Once 01/27/23 0542 01/27/23 0741      Culture/Microbiology    Component Value Date/Time   SDES PLEURAL 02/27/2023 1300   SDES PLEURAL 02/27/2023 1300   SPECREQUEST LEFT 02/27/2023 1300   SPECREQUEST LEFT 02/27/2023 1300   CULT  02/27/2023 1300    NO GROWTH 5 DAYS Performed at [facility], [address], [place], [place] [address]    REPTSTATUS 03/04/2023 FINAL 02/27/2023 1300   REPTSTATUS 02/27/2023 FINAL 02/27/2023 1300    Radiology Studies: No results found.   LOS: 47 days   Lanae Boast, MD [facility]  03/15/2023, 10:07 AM

## 2023-03-15 NOTE — Progress Notes (Signed)
Mobility Specialist Progress Note:   03/15/23 1459  Mobility  Activity Ambulated with assistance in room  Level of Assistance Standby assist, set-up cues, supervision of patient - no hands on  Assistive Device None  Distance Ambulated (ft) 150 ft  Activity Response Tolerated well  Mobility Referral Yes  $Mobility charge 1 Mobility  Mobility Specialist Start Time (ACUTE ONLY) 1430  Mobility Specialist Stop Time (ACUTE ONLY) 1443  Mobility Specialist Time Calculation (min) (ACUTE ONLY) 13 min   Pre Mobility: 95 HR During Mobility: 115 HR   Pt received in bed, agreeable to mobility. C/o slight fatigue after session otherwise asymptomatic throughout. Pt returned to bed with call bell in reach and all needs met.   Leory Plowman  Mobility Specialist Please contact via [employer] office at [phone]

## 2023-03-15 NOTE — Progress Notes (Signed)
PHARMACY CONSULT NOTE FOR:  OUTPATIENT  PARENTERAL ANTIBIOTIC THERAPY (OPAT)  Indication: Fungal endocarditis Regimen: micafungin 150 mg IV q 24 hours End date: 03/30/23  IV antibiotic discharge orders are pended. To discharging provider:  please sign these orders via discharge navigator,  Select New Orders & click on the button choice - Manage This Unsigned Work.     Thank you for allowing pharmacy to be a part of this patient's care.  Sharin Mons, PharmD, BCPS, BCIDP Infectious Diseases Clinical Pharmacist Phone: [phone] 03/15/2023, 1:23 PM

## 2023-03-15 NOTE — Progress Notes (Signed)
Occupational Therapy Treatment Patient Details Name: [name] MRN: [id number] DOB: Dec 19, 1970 Today's Date: 03/15/2023   History of present illness 52 y.o. female admitted on 01/27/2023 after being found unresponsive at home down in a pool of bloody vomit/stool. CPR was initiated upon arrival to ED, pt intubated and started on pressors. Pt undergoing management for inflammatory vs infections colitis. Pt extubated on 10/2. 10/10 underwent repeat proctosigmoidoscopy for ongoing diarrhea. Diffuse severe inflammation identified due to combination of Crohn's disease, active C. difficile infection and possible ischemic colitis from prior hypotension on presentation. 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 10/13 became tachycardic and hypotensive.  More lethargic and transferred back to ICU. On 10/15, blood cultures revealed fungemia. On 10/17, CT of abdomen and pelvis notable for septic emboli. Successful Left subclavian CVC placement with IR on 10/21. PMH includes COPD, Crohn's disease, HTN, DMII.   OT comments  OT session focused on training in techniques for increased safety and independence with ADLs, bed mobility in preparation for functional tasks, and functional transfers/mobility during/in preparation for functional tasks. Pt currently demonstrates ability to largely complete ADLs with Set up to Min assist, bed mobility with Supervision to Min assist, and functional mobility/transfers with a RW with Contact guard assist. Pt participated well in session but required frequent seated rest breaks due to decreased activity tolerance, pain, and episode of tachycardia with HR up to 154 bpm in standing at sink during ADLs and into upper 120s with functional mobility. Pt's HR quickly recovered to the 80s-90s with seated or supine rest. Pt is making good progress toward or has met all OT goals. OT goals upgraded this day. Pt will benefit from continued acute skilled OT services to address  deficits outlined below and increase safety and independence with functional tasks. Post acute discharge, pt will benefit from intensive inpatient skilled rehab services < 3 hours per day to maximize rehab potential.       If plan is discharge home, recommend the following:  A little help with walking and/or transfers;A little help with bathing/dressing/bathroom;Assistance with cooking/housework;Direct supervision/assist for medications management;Assist for transportation;Help with stairs or ramp for entrance   Equipment Recommendations  Other (comment) (defer to next level of care)    Recommendations for Other Services      Precautions / Restrictions Precautions Precautions: Fall Precaution Comments: watch HR/RR, L JP drain, new colostomy Restrictions Weight Bearing Restrictions: No       Mobility Bed Mobility Overal bed mobility: Needs Assistance Bed Mobility: Supine to Sit, Sit to Supine     Supine to sit: Supervision, HOB elevated, Used rails Sit to supine: Min assist, HOB elevated   General bed mobility comments: Min assist for B LE management into bed due to pain    Transfers Overall transfer level: Needs assistance Equipment used: Rolling walker (2 wheels) Transfers: Sit to/from Stand, Bed to chair/wheelchair/BSC Sit to Stand: Contact guard assist     Step pivot transfers: Contact guard assist (with RW)     General transfer comment: Pt able to maintain static standing balance without AD but reporting decreased pain with use of RW     Balance Overall balance assessment: Needs assistance Sitting-balance support: Feet supported, No upper extremity supported Sitting balance-Leahy Scale: Good Sitting balance - Comments: sitting EOB   Standing balance support: No upper extremity supported, Single extremity supported, During functional activity Standing balance-Leahy Scale: Fair Standing balance comment: Pt able to maintain balance in static stnding with no UE  support; required unilateral UE support  during functional mobility and dynamic standing.                           ADL either performed or assessed with clinical judgement   ADL Overall ADL's : Needs assistance/impaired Eating/Feeding: Set up;Sitting   Grooming: Supervision/safety;Contact guard assist;Standing   Upper Body Bathing: Contact guard assist;Sitting;Cueing for compensatory techniques Upper Body Bathing Details (indicate cue type and reason): simulated task Lower Body Bathing: Minimal assistance;Cueing for compensatory techniques;Sitting/lateral leans;Sit to/from stand Lower Body Bathing Details (indicate cue type and reason): simulated task Upper Body Dressing : Contact guard assist;Sitting Upper Body Dressing Details (indicate cue type and reason): assist with lines Lower Body Dressing: Supervision/safety;Contact guard assist;Sitting/lateral leans;Sit to/from stand   Toilet Transfer: Contact guard assist;Rolling walker (2 wheels);Ambulation Toilet Transfer Details (indicate cue type and reason): simulated with chair   Toileting - Clothing Manipulation Details (indicate cue type and reason): not addressed this session; pt continuing to need assistance to manage new colostomy     Functional mobility during ADLs: Contact guard assist;Rolling walker (2 wheels) (CGA for safety) General ADL Comments: Pt continues to present with decreased activity tolerance and required frequent seated rest breaks this session due to fatigue, pain, and episode of tachcardia in standing.    Extremity/Trunk Assessment Upper Extremity Assessment Upper Extremity Assessment: Right hand dominant;Generalized weakness   Lower Extremity Assessment Lower Extremity Assessment: Defer to PT evaluation        Vision       Perception     Praxis      Cognition Arousal: Alert Behavior During Therapy: WFL for tasks assessed/performed Overall Cognitive Status: Within Functional Limits for  tasks assessed                                 General Comments: AAOx4, mildly tangential, able to follow multi-step commands, and demonstrates good safety awareness and insight into deficits.        Exercises      Shoulder Instructions       General Comments VSS on RA at rest in sitting and supine. HR elevated to 154 bpm in standing at sink for ADLs and into the upper 120s during functional mobility. Pt also with RR rate elevating to 22 in standing and during mobility and decreasing to 14 at rest in sitting and supine. RN present during a portion of session.    Pertinent Vitals/ Pain       Pain Assessment Pain Assessment: Faces Faces Pain Scale: Hurts whole lot (2 to 4 at rest and 6 to 8 with mobility/standing) Pain Location: R and L side of abdomen Pain Descriptors / Indicators: Guarding, Discomfort, Grimacing Pain Intervention(s): Limited activity within patient's tolerance, Monitored during session, Repositioned, RN gave pain meds during session  Home Living                                          Prior Functioning/Environment              Frequency  Min 1X/week        Progress Toward Goals  OT Goals(current goals can now be found in the care plan section)  Progress towards OT goals: Progressing toward goals;Goals updated;Goals met and updated - see care plan  Acute Rehab OT Goals Patient Stated  Goal: to continue to get stronger and more independent and to have less pain OT Goal Formulation: With patient Time For Goal Achievement: 03/29/23 Potential to Achieve Goals: Good ADL Goals Pt Will Perform Grooming: with modified independence;standing Pt Will Perform Upper Body Bathing: with supervision;sitting Pt Will Perform Upper Body Dressing: with modified independence;sitting Pt Will Transfer to Toilet: with modified independence;ambulating;regular height toilet;grab bars Additional ADL Goal #1: Patient will perform bed mobility  with Mod I in preparation for ADLs.  Plan      Co-evaluation                 AM-PAC OT "6 Clicks" Daily Activity     Outcome Measure   Help from another person eating meals?: A Little Help from another person taking care of personal grooming?: A Little Help from another person toileting, which includes using toliet, bedpan, or urinal?: A Lot (new colostomy) Help from another person bathing (including washing, rinsing, drying)?: A Little Help from another person to put on and taking off regular upper body clothing?: A Little Help from another person to put on and taking off regular lower body clothing?: A Little 6 Click Score: 17    End of Session Equipment Utilized During Treatment: Rolling walker (2 wheels)  OT Visit Diagnosis: Unsteadiness on feet (R26.81);Muscle weakness (generalized) (M62.81);Pain;Other (comment) (decreased activity tolerance)   Activity Tolerance Patient tolerated treatment well;Patient limited by fatigue;Patient limited by pain;Treatment limited secondary to medical complications (Comment) (episode of tachycardia)   Patient Left in bed;with call bell/phone within reach;with bed alarm set   Nurse Communication Other (comment);Mobility status (Pt with episode of tachycardia in standing/ambulation. Pt reporting she would like to make a report regarding behavior of a staff member who works [employer]. Social worker also notified regarding pt request to make a report.)        Time: [phone] OT Time Calculation (min): 55 min  Charges: OT General Charges $OT Visit: 1 Visit OT Treatments $Self Care/Home Management : 53-67 mins  Deyvi Bonanno "Orson Eva., OTR/L, MA Acute Rehab [phone]   Lendon Colonel 03/15/2023, 12:46 PM

## 2023-03-16 DIAGNOSIS — B49 Unspecified mycosis: Secondary | ICD-10-CM | POA: Diagnosis not present

## 2023-03-16 MED ORDER — MAGNESIUM SULFATE 2 GM/50ML IV SOLN
2.0000 g | Freq: Once | INTRAVENOUS | Status: AC
  Administered 2023-03-16: 2 g via INTRAVENOUS
  Filled 2023-03-16: qty 50

## 2023-03-16 MED ORDER — CYCLOBENZAPRINE HCL 10 MG PO TABS
10.0000 mg | ORAL_TABLET | Freq: Three times a day (TID) | ORAL | Status: DC | PRN
  Administered 2023-03-16 – 2023-03-19 (×6): 10 mg via ORAL
  Filled 2023-03-16 (×6): qty 1

## 2023-03-16 NOTE — Progress Notes (Signed)
Patient was complaining of rectal pain and feeling heaviness on her anal area, assessment done on the area she mentioned, didn't notice anything. Notified to the MD and talked with the surgery PA about her pain and education provided to the patient about the information given by PA to me. She feels comfortable when she lies down on lateral position so repositioning done to provide comfort.

## 2023-03-16 NOTE — Progress Notes (Signed)
Received a call from the patient, requested for I/V pain medication for her 9/10 pain on her abdomen and lower back. When walked in to check on her, she was trembling and mentioned feeling dizziness, v/s checked which is mentioned below. Patient was consistently requesting for I/V pain medication, education provided about needs to be compliant with PO analgesics if possible but patient was still requesting for I/V analgesics. Because of her low blood pressure, no any analgesics given now, comfort measures provided and explained to the patient.  03/16/23 1326  Vitals  BP (!) 98/40  MAP (mmHg) (!) 57  BP Location Right Arm  BP Method Automatic  Patient Position (if appropriate) Sitting  Pulse Rate (!) 107  Pulse Rate Source Monitor  ECG Heart Rate (!) 108  Resp 14  MEWS COLOR  MEWS Score Color Yellow  Oxygen Therapy  SpO2 100 %  O2 Device Room Air  MEWS Score  MEWS Temp 0  MEWS Systolic 1  MEWS Pulse 1  MEWS RR 0  MEWS LOC 0  MEWS Score 2

## 2023-03-16 NOTE — Plan of Care (Signed)
  Problem: Education: Goal: Knowledge of General Education information will improve Description: Including pain rating scale, medication(s)/side effects and non-pharmacologic comfort measures Outcome: Progressing   Problem: Clinical Measurements: Goal: Ability to maintain clinical measurements within normal limits will improve Outcome: Progressing   Problem: Clinical Measurements: Goal: Respiratory complications will improve Outcome: Progressing   Problem: Activity: Goal: Risk for activity intolerance will decrease Outcome: Progressing   

## 2023-03-16 NOTE — Progress Notes (Signed)
PROGRESS NOTE [name]  EXB:[id number] DOB: [birth year] DOA: 01/27/2023 PCP: [name], PA-C  Brief Narrative/Hospital Course: 23 yom w/ COPD, Crohn's on Skyrizi, hypertension, type 2 diabetes mellitus who initially presented to the after being found unresponsive. Per family, patient has had bloody emesis with diarrhea over the past few days. Initial systolics in the 60s in the ED, and patient was minimally responsive. In the emergency room, patient was intubated and admitted to the ICU for septic shock and encephalopathy on 01/27/2023.  For further evaluation and management for shock and altered mental status.  She was  found to have  C. difficile colitis, intra-abdominal infection with abscess and fungemia. ID consulted and started on prolonged p.o. vancomycin with taper and micafungin in addition to IV antibiotics for intra-abdominal abscess.  Eventually, she came off vasopressors on 9/29 and extubated on 10/2 and transferred to floor on 02/03/23.  She is with ongoing diarrhea and had repeat proctosigmoidoscopy that showed severe inflammation likely from underlying Crohn's disease, active C. difficile infection and possible ischemic colitis from prior hypotension on presentation. 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 She became tachycardic with hypotension and lethargy and transferred back to ICU on 02/13/23 Blood culture on 10/15 grew Candida glabrata for which she was started on micafungin. CT abdomen and pelvis on 10/16>>concerning for incidental small PE to RLL pulmonary artery branch raising concern for septic emboli. S/p TEE on 10/18 that was negative for endocarditis. S/p antibiotics course and p.o. vancomycin and on micafungin for fungemia for a total x 6 weeks from 10/16 as per ID  In regards to abdominal wound, wound VAC removed.  Surgery recommended  wet-to-dry dressing.  Surgery following weekly. Ptot Recommended CIR but turned down by CIR due lack of 24/7 supervision and need for prolonged rehabilitation. Plan is discharge to SNF once tachycardia and diarrhea fairly controlled AND titrating metoprolol added ivabradine on 03/09/23. Weaning of IV pain medication. ID has advised antibiotic with PICC line maintain Vanco trough 15-20 or AUC 400-5 50 duration 6 weeks end of treatment 03/30/2023.    Subjective: Patient seen and examined this morning, reports he continues to intermittent IV pain medication had sensation of bowel movement  Overnight afebrile, vital stable Lab remains stable unchanged with chronic anemia thrombocytosis and hyponatremia   Assessment and Plan: Principal Problem:   Fungemia Active Problems:   Metabolic encephalopathy   Shock (HCC)   Cardiac arrest (HCC)   Acute respiratory failure with hypoxia (HCC)   Upper GI bleed   Granulomatous colitis (HCC)   Pseudomembranous colitis   Crohn's disease without complication (HCC)   Ileus (HCC)   C. difficile diarrhea   Sepsis (HCC)   Pressure injury of skin   Hyponatremia   Severe protein-calorie malnutrition (HCC)   ABLA (acute blood loss anemia)   Pulmonary embolus (HCC)   Splenic infarct   Bandemia  Septic shock multifactorial Intra-abdominal abscess Fungemia 2/2 Candida glabrata Septic emboli: Septic shock multifactorial due to C. difficile colitis fungemia and intramural abscess- now resolved S/p laparotomy with total colectomy with ileostomy and LLQ JP drain in [facility], wound care following.   Work up negative for endocarditis in  TTE/TEE ID recommended: 6 wks of antifungal from 02/16/23, vancomycin followed by taper for C. Difficile Seen by ophthalmology 10/23 no fungal infiltrate on funduscopic exam Left subclavian tunneled CVC placed 02/21/2023 with IR.  Afebrile and leukocytosis resolved to 9.1 Waiting  for placement. Recent Labs  Lab  03/11/23 0555 03/12/23 0640 03/13/23 1156 03/14/23 0625 03/15/23 0440  WBC 10.8* 9.5 11.3* 9.1 9.4      Abdominal pain Abdominal wound: Continue oral Dilaudid, fentanyl patch Robaxin patient has been on IV Dilaudid and weaned off. Cont fentanyl patch.change Robaxin to Flexeril per her request.  Again discussed need to wean off pain medication and not use iv   Left-sided chest pain: EKG and troponin negative.resolved  Headache: Attributed to oxycodone-stopped and it resolved.  Inadequate oral intake: Augment nutritional status. RD following.  Postoperative short bowel syndrome/high output ostomy Malabsorption likely acute on chronic: Improving.  Continue lomotil qid,Imodium qid. Encourage oral intake and augment. Not a candidate for TPN due to fungemia   Sinus tachycardia: In the setting of acute illness possible dehydration, and pain,TSH stable. had some pericardial effusion without tamponade.  Repeat CT on 10/25 did not reveal pericardial effusion.  HR stable now on ivabradine and metoprolol  Left-sided pleural effusion/iatrogenic pneumothorax, small:  s/p thoracocentesis on 10/27.Fluid is exudative by LDH.  Fluid culture NGTD.Subsequent chest x-ray on 10/28 without pneumothorax.  Cytology negative.  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 Cardell Peach with urology who recommended outpatient evaluation with cystoscopy   Acute pulmonary emboli/right upper extremity DVT: On Lovenox>switched to DOAC 11/12. Cont same.   Thrombocytosis: Multifactorial. Evaluated by hematology and felt to be inflammatory response.  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  Resolving.  Follow-up outpatient    Longstanding Crohn's disease: Not chronically on steroids so less concern for adrenal insufficiency. S/p colectomy. Steroids/Skyrizi held in the setting of  infection.  Slowly resume as outpatient.   Pericardial effusion without tamponade: Initially seen on TTE.  Was not seen on CTA on 10/25 . May need outpatient echocardiogram to confirm resolution.   Cardiac arrest secondary to septic shock requiring CPR on admission. stable   Type 2 diabetes mellitus with hyperglycemia  A1c 5.8  cbg stable as below. Recent Labs  Lab 03/13/23 1734 03/14/23 1150 03/14/23 1652 03/15/23 1302 03/15/23 1659  GLUCAP 140* 108* 101* 116* 95    Seizure disorder history:  Stable, continue her keppra/gabapentin   Anxiety disorder: Mood stable on home Xanax, Cymbalta, hydroxyzine and amitriptyline.   Hypertension: BP stable  on metoprolol   History of COPD: Not in exacerbation, continue Yupelri daily and xopenex PRN   GERD KEEP PPI   Normocytic Anemia: Stable Stable monitor and transfuse if less than 7 mg    Hypokalemia/hypomagnesemia Monitor and replete    Inadequate oral intake/weight loss: Lost about 30 pounds so far.  DVT prophylaxis: SCDs Start: 01/27/23 1111 Code Status:   Code Status: Full Code Family Communication: plan of care discussed with patient at bedside. Patient status is: Inpatient because of fungemia Level of care: Telemetry Cardiac   Dispo: The patient is from: home            Anticipated disposition: SNF once available.    Objective: Vitals last 24 hrs: Vitals:   03/16/23 0753 03/16/23 0757 03/16/23 0827 03/16/23 1057  BP: (!) 118/54  108/67   Pulse: 99  (!) 107 90  Resp:      Temp: 97.8 F (36.6 C)   97.9 F (36.6 C)  TempSrc: Oral   Oral  SpO2: 97% 97%    Weight:      Height:       Weight  change:   Physical Examination: General exam: alert awake, oriented at baseline, older than stated age HEENT:Oral mucosa moist, Ear/Nose WNL grossly Respiratory system: Bilaterally clear BS,no use of accessory muscle Cardiovascular system: S1 & S2 +, No JVD. Gastrointestinal system: Abdomen soft, midline incision  healing, colostomy in place Nervous System: Alert, awake, moving all extremities,and following commands. Extremities: LE edema neg,distal peripheral pulses palpable and warm.  Skin: No rashes,no icterus. MSK: Normal muscle bulk,tone, power   Medications reviewed:  Scheduled Meds:  acetaminophen  650 mg Oral Q6H WA   amitriptyline  100 mg Oral QHS   apixaban  5 mg Oral BID   Chlorhexidine Gluconate Cloth  6 each Topical Daily   diphenoxylate-atropine  1 tablet Oral QID   DULoxetine  30 mg Oral Daily   feeding supplement  237 mL Oral TID BM   fentaNYL  1 patch Transdermal Q72H   folic acid  1 mg Oral Daily   gabapentin  100 mg Oral TID   Gerhardt's butt cream   Topical BID   ivabradine  5 mg Oral BID WC   levETIRAcetam  750 mg Oral BID   lidocaine  2 patch Transdermal Q24H   loperamide  4 mg Oral QID   loratadine  10 mg Oral Daily   metoprolol tartrate  62.5 mg Oral BID   multivitamin with minerals  1 tablet Oral Daily   pantoprazole  40 mg Oral Daily   polycarbophil  1,250 mg Oral BID   revefenacin  175 mcg Nebulization Daily   sodium chloride flush  10-40 mL Intracatheter Q12H   thiamine  100 mg Oral Daily   vancomycin  125 mg Oral Daily   Followed by   Melene Muller ON 03/20/2023] vancomycin  125 mg Oral QODAY   Followed by   Melene Muller ON 03/28/2023] vancomycin  125 mg Oral Q3 days   Continuous Infusions:  micafungin (MYCAMINE) 150 mg in sodium chloride 0.9 % 100 mL IVPB 150 mg (03/16/23 0953)    Diet Order             Diet regular Room service appropriate? Yes; Fluid consistency: Thin  Diet effective now                  Intake/Output Summary (Last 24 hours) at 03/16/2023 1120 Last data filed at 03/16/2023 0412 Gross per 24 hour  Intake 777.74 ml  Output 1485 ml  Net -707.26 ml  Net IO Since Admission: -31,203.21 mL [03/16/23 1120]  Wt Readings from Last 3 Encounters:  03/15/23 59.4 kg  11/13/22 63.5 kg  03/11/22 58.5 kg   Unresulted Labs (From admission,  onward)     Start     Ordered   03/17/23 0500  Magnesium  Tomorrow morning,   R       Question:  Specimen collection method  Answer:  Unit=Unit collect   03/16/23 0917          Data Reviewed: I have personally reviewed following labs and imaging studies CBC: Recent Labs  Lab 03/11/23 0555 03/12/23 0640 03/13/23 1156 03/14/23 0625 03/15/23 0440  WBC 10.8* 9.5 11.3* 9.1 9.4  NEUTROABS  --   --  8.2* 4.8  --   HGB 11.5* 9.9* 10.6* 10.4* 10.6*  HCT 35.5* 32.0* 33.0* 34.1* 33.5*  MCV 87.9 87.7 88.0 89.5 87.0  PLT 949* 873* 775* 741* 677*  Basic Metabolic Panel: Recent Labs  Lab 03/10/23 0220 03/11/23 0555 03/12/23 0640 03/13/23 1156 03/14/23 0625 03/15/23 0440  NA 127* 126* 132*  --  132* 130*  K 3.7 4.1 3.8  --  4.0 4.0  CL 90* 91* 101  --  99 96*  CO2 25 25 23   --  22 24  GLUCOSE 94 92 93  --  73 91  BUN 10 12 8   --  6 6  CREATININE 0.85 0.86 0.70  --  0.65 0.73  CALCIUM 10.2 10.0 9.2  --  10.0 10.3  MG 1.3* 2.0 1.5* 1.5*  --   --   PHOS 3.8 3.7 3.5  --   --   --   ZOX:[id number] Creatinine Clearance: 67.3 mL/min (by C-G formula based on SCr of 0.73 mg/dL). Liver Function Tests: Recent Labs  Lab 03/10/23 0220 03/11/23 0555 03/12/23 0640 03/15/23 0440  AST  --   --   --  31  ALT  --   --   --  22  ALKPHOS  --   --   --  183*  BILITOT  --   --   --  0.3  PROT  --   --   --  6.4*  ALBUMIN 3.0* 3.0* 2.7* 2.7*   Recent Labs  Lab 03/13/23 1734 03/14/23 1150 03/14/23 1652 03/15/23 1302 03/15/23 1659  GLUCAP 140* 108* 101* 116* 95  No results found for this or any previous visit (from the past 240 hour(s)).  Antimicrobials: Anti-infectives (From admission, onward)    Start     Dose/Rate Route Frequency Ordered Stop   03/28/23 1000  vancomycin (VANCOCIN) capsule 125 mg       Placed in "Followed by" Linked Group   125 mg Oral Every 3 DAYS 03/01/23 0750 04/06/23 0959   03/20/23 1000  vancomycin (VANCOCIN) capsule 125 mg       Placed in "Followed by"  Linked Group   125 mg Oral Every other day 03/01/23 0750 03/28/23 0959   03/13/23 1000  vancomycin (VANCOCIN) capsule 125 mg       Placed in "Followed by" Linked Group   125 mg Oral Daily 03/01/23 0750 03/20/23 0959   03/06/23 1000  vancomycin (VANCOCIN) capsule 125 mg       Placed in "Followed by" Linked Group   125 mg Oral 2 times daily 03/01/23 0750 03/12/23 2303   03/01/23 1000  vancomycin (VANCOCIN) capsule 125 mg       Placed in "Followed by" Linked Group   125 mg Oral 4 times daily 03/01/23 0750 03/05/23 2134   02/18/23 1800  vancomycin (VANCOCIN) 50 mg/mL oral solution SOLN 125 mg        125 mg Oral 4 times daily 02/18/23 1439 02/26/23 0959   02/18/23 0400  micafungin (MYCAMINE) 150 mg in sodium chloride 0.9 % 100 mL IVPB        150 mg 107.5 mL/hr over 1 Hours Intravenous Every 24 hours 02/17/23 0936 03/30/23 2359   02/17/23 0900  vancomycin (VANCOREADY) IVPB 750 mg/150 mL  Status:  Discontinued        750 mg 150 mL/hr over 60 Minutes Intravenous Every 8 hours 02/17/23 0204 02/17/23 0947   02/17/23 0400  micafungin (MYCAMINE) 100 mg in sodium chloride 0.9 % 100 mL IVPB  Status:  Discontinued        100 mg 105 mL/hr over 1 Hours Intravenous Every 24 hours 02/16/23 0353 02/17/23 0936   02/16/23 1045  vancomycin (VANCOCIN) 50 mg/mL oral solution SOLN 125 mg  Status:  Discontinued  125 mg Per Tube 4 times daily 02/16/23 0953 02/18/23 1439   02/16/23 0415  micafungin (MYCAMINE) 100 mg in sodium chloride 0.9 % 100 mL IVPB        100 mg 105 mL/hr over 1 Hours Intravenous STAT 02/16/23 0353 02/16/23 0610   02/15/23 1300  vancomycin (VANCOREADY) IVPB 750 mg/150 mL  Status:  Discontinued        750 mg 150 mL/hr over 60 Minutes Intravenous Every 12 hours 02/14/23 1156 02/17/23 0204   02/14/23 1130  vancomycin (VANCOCIN) IVPB 1000 mg/200 mL premix        1,000 mg 200 mL/hr over 60 Minutes Intravenous  Once 02/14/23 1043 02/14/23 1249   02/11/23 2000  cefTRIAXone (ROCEPHIN) 2 g in  sodium chloride 0.9 % 100 mL IVPB  Status:  Discontinued       Note to Pharmacy: Pharmacy may adjust dosing strength / duration / interval for maximal efficacy   2 g 200 mL/hr over 30 Minutes Intravenous Every 24 hours 02/11/23 1622 02/28/23 0852   02/10/23 1600  metroNIDAZOLE (FLAGYL) IVPB 500 mg  Status:  Discontinued        500 mg 100 mL/hr over 60 Minutes Intravenous Every 12 hours 02/10/23 1444 02/28/23 0857   02/08/23 1800  vancomycin (VANCOCIN) 50 mg/mL oral solution SOLN 500 mg        500 mg Per Tube Every 6 hours 02/08/23 1325 02/12/23 1759   02/03/23 2200  metroNIDAZOLE (FLAGYL) tablet 500 mg        500 mg Per Tube Every 12 hours 02/03/23 1406 02/06/23 2148   02/02/23 1815  vancomycin (VANCOCIN) 50 mg/mL oral solution SOLN 125 mg  Status:  Discontinued        125 mg Per Tube Every 6 hours 02/02/23 1717 02/08/23 1325   02/02/23 1545  vancomycin (VANCOCIN) capsule 125 mg  Status:  Discontinued        125 mg Oral 4 times daily 02/02/23 1530 02/02/23 1717   01/27/23 1430  cefTRIAXone (ROCEPHIN) 2 g in sodium chloride 0.9 % 100 mL IVPB        2 g 200 mL/hr over 30 Minutes Intravenous Every 24 hours 01/27/23 1340 02/05/23 1403   01/27/23 1400  metroNIDAZOLE (FLAGYL) IVPB 500 mg  Status:  Discontinued        500 mg 100 mL/hr over 60 Minutes Intravenous Every 12 hours 01/27/23 1340 02/03/23 1406   01/27/23 0545  vancomycin (VANCOREADY) IVPB 1250 mg/250 mL        1,250 mg 166.7 mL/hr over 90 Minutes Intravenous  Once 01/27/23 0542 01/27/23 0809   01/27/23 0545  ceFEPIme (MAXIPIME) 2 g in sodium chloride 0.9 % 100 mL IVPB        2 g 200 mL/hr over 30 Minutes Intravenous  Once 01/27/23 0542 01/27/23 0621   01/27/23 0545  metroNIDAZOLE (FLAGYL) IVPB 500 mg        500 mg 100 mL/hr over 60 Minutes Intravenous  Once 01/27/23 0542 01/27/23 0741      Culture/Microbiology    Component Value Date/Time   SDES PLEURAL 02/27/2023 1300   SDES PLEURAL 02/27/2023 1300   SPECREQUEST LEFT  02/27/2023 1300   SPECREQUEST LEFT 02/27/2023 1300   CULT  02/27/2023 1300    NO GROWTH 5 DAYS Performed at [facility], [address], [place], [place] [address]    REPTSTATUS 03/04/2023 FINAL 02/27/2023 1300   REPTSTATUS 02/27/2023 FINAL 02/27/2023 1300    Radiology Studies:  No results found.   LOS: 48 days   Lanae Boast, MD [facility]  03/16/2023, 11:20 AM

## 2023-03-16 NOTE — Progress Notes (Signed)
Mobility Specialist Progress Note:   03/16/23 1506  Mobility  Activity Ambulated with assistance in room  Level of Assistance Standby assist, set-up cues, supervision of patient - no hands on  Assistive Device Front wheel walker  Distance Ambulated (ft) 90 ft  Activity Response Tolerated well  Mobility Referral Yes  $Mobility charge 1 Mobility  Mobility Specialist Start Time (ACUTE ONLY) 1435  Mobility Specialist Stop Time (ACUTE ONLY) 1445  Mobility Specialist Time Calculation (min) (ACUTE ONLY) 10 min   Pre Mobility: 112 HR , 92/67 BP During Mobility: 124 HR  Post Mobility: 108 HR  Pt received in chair, agreeable to mobility. C/o abdominal pain from drain prior to ambulation, otherwise asx throughout. VSS. Pt returned to chair with call bell in reach and all needs met.  [name]  Mobility Specialist Please contact via [employer] office at [phone]

## 2023-03-16 NOTE — Progress Notes (Signed)
Physical Therapy Progress Note Patient Details Name: [name] MRN: [id number] DOB: 02-Oct-1970 Today's Date: 03/16/2023   History of Present Illness 52 y.o. female admitted on 01/27/2023 after being found unresponsive at home down in a pool of bloody vomit/stool. CPR was initiated upon arrival to ED, pt intubated and started on pressors. Pt undergoing management for inflammatory vs infections colitis. Pt extubated on 10/2. 10/10 underwent repeat proctosigmoidoscopy for ongoing diarrhea. Diffuse severe inflammation identified due to combination of Crohn's disease, active C. difficile infection and possible ischemic colitis from prior hypotension on presentation. 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 10/13 became tachycardic and hypotensive.  More lethargic and transferred back to ICU. On 10/15, blood cultures revealed fungemia. On 10/17, CT of abdomen and pelvis notable for septic emboli. Successful Left subclavian CVC placement with IR on 10/21. PMH includes COPD, Crohn's disease, HTN, DMII.    PT Comments  Pt goals were assessed and updated this session. Pt has met all of her goals.  Pt continues to progress; goal now for Mod I with AD. Pt at slight increase in risk for falls due to managing drains, lines and leads. Due to pt current functional status recommending skilled physical therapy services 3x/weekly on discharge from acute care hospital setting once medically cleared. Pt was limited this session due to fatigue and pain relating to feelings of needing to take a BM that were not exacerbated by movement.    If plan is discharge home, recommend the following: Assistance with cooking/housework;Assist for transportation;Help with stairs or ramp for entrance;A little help with walking and/or transfers   Can travel by private vehicle     No  Equipment Recommendations  Rolling walker (2 wheels)       Precautions / Restrictions Precautions Precautions: Fall Precaution  Comments: watch HR/RR, L JP drain, new colostomy Restrictions Weight Bearing Restrictions: No     Mobility  Bed Mobility       General bed mobility comments: Pt received in recliner and left in recliner at end of session.    Transfers Overall transfer level: Needs assistance Equipment used: Rolling walker (2 wheels) Transfers: Sit to/from Stand Sit to Stand: Contact guard assist           General transfer comment: Pt stood at [facility] to supervision. Assist with lines and leads.    Ambulation/Gait Ambulation/Gait assistance: Contact guard assist, Supervision Gait Distance (Feet): 40 Feet Assistive device: Rolling walker (2 wheels) Gait Pattern/deviations: Decreased stride length, Step-through pattern Gait velocity: decreased Gait velocity interpretation: <1.31 ft/sec, indicative of household ambulator   General Gait Details: pt was reporting pain in the buttocks that felt like she needed to have BM. pt educated on the benefits of mobility. Pt agreeable. Pt reports no increase in pain with gait and mobility. Short steps with gait pt is careful due to drains/lines/leads      Balance Overall balance assessment: Mild deficits observed, not formally tested Sitting-balance support: Feet supported, No upper extremity supported Sitting balance-Leahy Scale: Normal Sitting balance - Comments: sitting Edge of recliner     Standing balance-Leahy Scale: Fair Standing balance comment: no overt LOB. minimal use of UE on RW        Cognition Arousal: Alert Behavior During Therapy: WFL for tasks assessed/performed Overall Cognitive Status: Within Functional Limits for tasks assessed         General Comments General comments (skin integrity, edema, etc.): Pt BP was low per nursing. Pt states her BP is always 90/50 and has been  told she needs to alert medial personnel due to low BP. Pt BP in sitting 95/68 in standing 90/50      Pertinent Vitals/Pain Pain Assessment Faces Pain  Scale: Hurts whole lot Facial Expression: Tense Body Movements: Protection Muscle Tension: Tense, rigid Pain Location: buttocks pt reports it feels like she has a BM Pain Descriptors / Indicators: Guarding, Discomfort, Grimacing Pain Intervention(s): Monitored during session, Limited activity within patient's tolerance     PT Goals (current goals can now be found in the care plan section) Acute Rehab PT Goals Patient Stated Goal: home PT Goal Formulation: With patient Time For Goal Achievement: 04/13/23 Potential to Achieve Goals: Good Progress towards PT goals: Progressing toward goals    Frequency    Min 1X/week      PT Plan  Continue with current POC       AM-PAC PT "6 Clicks" Mobility   Outcome Measure  Help needed turning from your back to your side while in a flat bed without using bedrails?: A Little Help needed moving from lying on your back to sitting on the side of a flat bed without using bedrails?: A Little Help needed moving to and from a bed to a chair (including a wheelchair)?: A Little Help needed standing up from a chair using your arms (e.g., wheelchair or bedside chair)?: A Little Help needed to walk in hospital room?: A Little Help needed climbing 3-5 steps with a railing? : A Little 6 Click Score: 18    End of Session Equipment Utilized During Treatment: Gait belt Activity Tolerance: Patient limited by fatigue Patient left: with call bell/phone within reach;in chair Nurse Communication: Mobility status PT Visit Diagnosis: Other abnormalities of gait and mobility (R26.89);Muscle weakness (generalized) (M62.81)     Time: 4540-9811 PT Time Calculation (min) (ACUTE ONLY): 18 min  Charges:    $Therapeutic Activity: 8-22 mins PT General Charges $$ ACUTE PT VISIT: 1 Visit                     Harrel Carina, DPT, CLT  Acute Rehabilitation Services Office: [phone] (Secure chat preferred)    Claudia Desanctis 03/16/2023, 2:50  PM

## 2023-03-16 NOTE — Progress Notes (Addendum)
Nutrition Follow-up  DOCUMENTATION CODES:   Not applicable  INTERVENTION:   -Continue regular diet with double protein portions for B and L, discontinue at D per patient preference. Allow food from home,  however discourage excessive juice intake as likely contributing to reported diarrhea.   -Discontinue Ensure Enlive as patient not taking. She is drinking oral supplement brought from home Resource, each 237 mL serving contains 477 calories and 20 gm protein.  -Continue MVI/Minerals daily for micronutrient support due to inadequate nutrition intake.   NUTRITION DIAGNOSIS:   Inadequate oral intake related to inability to eat as evidenced by NPO status, diet advanced. -addressing with meals, oral supplement from home   GOAL:   Patient will meet greater than or equal to 90% of their needs  -progressing  MONITOR:   PO intake, Labs, I & O's, Supplement acceptance, Weight trends, Skin  REASON FOR ASSESSMENT:   Follow up   ASSESSMENT:   52 year old female with past medical history of COPD, Crohn's on Skyrizi, hypertension, type 2 diabetes mellitus who initially presented to the after being found unresponsive.  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  She is consuming foods from home. Complains of too large of protein serving at D which has been overwhelming and would like to have regular portion, updated in menu software.  Today at B had cream of potato and bacon soup. She has a collection of juices on bedside table and on window sill.  Discussed the excessive juice intake could contribute to diarrhea.  Also reviewed food safety, with concern of V8 splash juice left out prolonged and  no longer safe to drink, patient verbalized understanding.  Discussed appropriate hydration sources and encouraged protein each meal/snack. She is taking Resource oral supplement brought from home. Written education materials for new ileostomy to be given for reference. Cortrak removed 10/18. Patient will need education for diet with new ileostomy.  Weight fluctuations since admit, although weights trending down.  Medications reviewed and include folic acid 1 mg daily, MVI/Minerals-1 Tab daily, PPI, polycarbophil, thiamine 100 mg daily, vancomycin.  Labs reviewed    Diet Order:   Diet Order             Diet regular Room service appropriate? Yes; Fluid consistency: Thin  Diet effective now                   EDUCATION NEEDS:   Education needs have been addressed  Skin:  Skin Assessment: Skin Integrity Issues: Skin Integrity Issues:: Incisions Incisions: abdominal incision, closed Other: moisture associated skin damage to bilateral perineum  Last BM:  11/13 1475 ileostomy output past 24 hr  Height:   Ht Readings from Last 1 Encounters:  02/02/23 5' 2.99" (1.6 m)    Weight:   Wt Readings from Last 1 Encounters:  03/15/23 59.4 kg    Ideal Body Weight:  52.3 kg  BMI:  Body mass index is 23.2 kg/m.  Estimated Nutritional Needs:   Kcal:  1700-1900 kcal/d  Protein:  85-100 g/d  Fluid:  >1.8L/d    Alvino Chapel, RDLD Clinical Dietitian See AMION for contact information

## 2023-03-16 NOTE — Progress Notes (Signed)
Mg came back 1.5 after 2g given on 11/9. Ok to give 2 more grams and check level in AM per Dr. Angus Seller, PharmD, BCIDP, AAHIVP, CPP Infectious Disease Pharmacist 03/16/2023 9:18 AM

## 2023-03-17 DIAGNOSIS — B49 Unspecified mycosis: Secondary | ICD-10-CM | POA: Diagnosis not present

## 2023-03-17 LAB — CBC
HCT: 31.9 % — ABNORMAL LOW (ref 36.0–46.0)
Hemoglobin: 10.4 g/dL — ABNORMAL LOW (ref 12.0–15.0)
MCH: 28.2 pg (ref 26.0–34.0)
MCHC: 32.6 g/dL (ref 30.0–36.0)
MCV: 86.4 fL (ref 80.0–100.0)
Platelets: 656 10*3/uL — ABNORMAL HIGH (ref 150–400)
RBC: 3.69 MIL/uL — ABNORMAL LOW (ref 3.87–5.11)
RDW: 16.2 % — ABNORMAL HIGH (ref 11.5–15.5)
WBC: 8 10*3/uL (ref 4.0–10.5)
nRBC: 0 % (ref 0.0–0.2)

## 2023-03-17 LAB — BASIC METABOLIC PANEL
Anion gap: 11 (ref 5–15)
BUN: 7 mg/dL (ref 6–20)
CO2: 23 mmol/L (ref 22–32)
Calcium: 9.6 mg/dL (ref 8.9–10.3)
Chloride: 92 mmol/L — ABNORMAL LOW (ref 98–111)
Creatinine, Ser: 0.93 mg/dL (ref 0.44–1.00)
GFR, Estimated: >60 mL/min (ref >60)
Glucose, Bld: 89 mg/dL (ref 70–99)
Potassium: 3.5 mmol/L (ref 3.5–5.1)
Sodium: 126 mmol/L — ABNORMAL LOW (ref 135–145)

## 2023-03-17 LAB — SODIUM, URINE, RANDOM: Sodium, Ur: <10 mmol/L

## 2023-03-17 LAB — MAGNESIUM: Magnesium: 1.7 mg/dL (ref 1.7–2.4)

## 2023-03-17 LAB — OSMOLALITY, URINE: Osmolality, Ur: 208 mosm/kg — ABNORMAL LOW (ref 300–900)

## 2023-03-17 MED ORDER — SODIUM CHLORIDE 1 G PO TABS
1.0000 g | ORAL_TABLET | Freq: Three times a day (TID) | ORAL | Status: DC
  Administered 2023-03-17 – 2023-03-19 (×7): 1 g via ORAL
  Filled 2023-03-17 (×7): qty 1

## 2023-03-17 MED ORDER — MAGNESIUM SULFATE IN D5W 1-5 GM/100ML-% IV SOLN
1.0000 g | Freq: Once | INTRAVENOUS | Status: AC
  Administered 2023-03-17: 1 g via INTRAVENOUS
  Filled 2023-03-17: qty 100

## 2023-03-17 MED ORDER — FENTANYL 25 MCG/HR TD PT72
2.0000 | MEDICATED_PATCH | TRANSDERMAL | Status: DC
  Administered 2023-03-17: 2 via TRANSDERMAL
  Filled 2023-03-17: qty 2

## 2023-03-17 NOTE — TOC Progression Note (Signed)
Transition of Care [facility]) - Progression Note    Patient Details  Name: [name] MRN: [id number] Date of Birth: August 24, 1970  Transition of Care [facility]) CM/SW Contact  Eduard Roux, [place] Phone Number: 03/17/2023, 1:29 PM  Clinical Narrative:     CSW spoke with patient- she has declined SNF and states she will discharge home with her daughter @ [address] [place], [place] [address].  TOC will continue to follow and assist with discharge planning.  Antony Blackbird, MSW, LCSW Clinical Social Worker    Expected Discharge Plan: Skilled Nursing Facility Barriers to Discharge: Continued Medical Work up, SNF Pending bed offer, Insurance Authorization  Expected Discharge Plan and Services In-house Referral: Clinical Social Work Discharge Planning Services: CM Consult Post Acute Care Choice: Skilled Nursing Facility Living arrangements for the past 2 months: Single Family Home                                       Social Determinants of Health (SDOH) Interventions SDOH Screenings   Food Insecurity: No Food Insecurity (03/05/2023)  Housing: Low Risk  (03/05/2023)  Transportation Needs: No Transportation Needs (03/05/2023)  Utilities: Not At Risk (03/05/2023)  Financial Resource Strain: Medium Risk (10/18/2022)   Received from [facility]  Social Connections: Unknown (09/01/2021)   Received from [facility], [facility]  Stress: Stress Concern Present (06/25/2020)   Received from [facility], [facility]  Tobacco Use: High Risk (02/18/2023)    Readmission Risk Interventions     No data to display

## 2023-03-17 NOTE — Plan of Care (Signed)
  Problem: Clinical Measurements: Goal: Ability to maintain clinical measurements within normal limits will improve Outcome: Progressing   Problem: Clinical Measurements: Goal: Diagnostic test results will improve Outcome: Progressing   Problem: Clinical Measurements: Goal: Will remain free from infection Outcome: Progressing   Problem: Coping: Goal: Level of anxiety will decrease Outcome: Progressing   Problem: Education: Goal: Ability to describe self-care measures that may prevent or decrease complications (Diabetes Survival Skills Education) will improve Outcome: Progressing

## 2023-03-17 NOTE — Progress Notes (Signed)
Mobility Specialist Progress Note:   03/17/23 1428  Mobility  Activity Ambulated with assistance in room  Level of Assistance Standby assist, set-up cues, supervision of patient - no hands on  Assistive Device Front wheel walker  Distance Ambulated (ft) 150 ft  Activity Response Tolerated well  Mobility Referral Yes  $Mobility charge 1 Mobility  Mobility Specialist Start Time (ACUTE ONLY) 1400  Mobility Specialist Stop Time (ACUTE ONLY) 1420  Mobility Specialist Time Calculation (min) (ACUTE ONLY) 20 min   Pt received in bed, agreeable to mobility. Denied any discomfort during ambulation. Asx throughout. Pt returned to bed with NT and OT present in room.  Leory Plowman  Mobility Specialist Please contact via [employer] office at [phone]

## 2023-03-17 NOTE — Progress Notes (Signed)
PROGRESS NOTE [name]  QQV:[id number] DOB: 06/12/1970 DOA: 01/27/2023 PCP: [name], PA-C  Brief Narrative/Hospital Course: 51 yom w/ COPD, Crohn's on Skyrizi, hypertension, type 2 diabetes mellitus who initially presented to the after being found unresponsive. Per family, patient has had bloody emesis with diarrhea over the past few days. Initial systolics in the 60s in the ED, and patient was minimally responsive. In the emergency room, patient was intubated and admitted to the ICU for septic shock and encephalopathy on 01/27/2023.  For further evaluation and management for shock and altered mental status.  She was  found to have  C. difficile colitis, intra-abdominal infection with abscess and fungemia. ID consulted and started on prolonged p.o. vancomycin with taper and micafungin in addition to IV antibiotics for intra-abdominal abscess.  Eventually, she came off vasopressors on 9/29 and extubated on 10/2 and transferred to floor on 02/03/23.  She is with ongoing diarrhea and had repeat proctosigmoidoscopy that showed severe inflammation likely from underlying Crohn's disease, active C. difficile infection and possible ischemic colitis from prior hypotension on presentation. 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 She became tachycardic with hypotension and lethargy and transferred back to ICU on 02/13/23 Blood culture on 10/15 grew Candida glabrata for which she was started on micafungin. CT abdomen and pelvis on 10/16>>concerning for incidental small PE to RLL pulmonary artery branch raising concern for septic emboli. S/p TEE on 10/18 that was negative for endocarditis. S/p antibiotics course and p.o. vancomycin and on micafungin for fungemia for a total x 6 weeks from 10/16 as per ID  In regards to abdominal wound, wound VAC removed.  Surgery recommended  wet-to-dry dressing.  Surgery following weekly. Ptot Recommended CIR but turned down by CIR due lack of 24/7 supervision and need for prolonged rehabilitation. Plan is discharge to SNF once tachycardia and diarrhea fairly controlled AND titrating metoprolol added ivabradine on 03/09/23. Weaning of IV pain medication. ID has advised antibiotic with PICC line maintain Vanco trough 15-20 or AUC 400-5 50 duration 6 weeks end of treatment 03/30/2023.    Subjective: Patient seen and examined complains of ongoing pain Oral Dilaudid not seems to be working she says -Overnight mild Tachycardia otherwise BP stable no fever Labs showed sodium level fluctuating down at 126 today-previously running from 126 (on 11/7) to 130,CBC overall unchanged anemia and thrombocytosis She is wondering if she can go home  Assessment and Plan: Principal Problem:   Fungemia Active Problems:   Metabolic encephalopathy   Shock (HCC)   Cardiac arrest (HCC)   Acute respiratory failure with hypoxia (HCC)   Upper GI bleed   Granulomatous colitis (HCC)   Pseudomembranous colitis   Crohn's disease without complication (HCC)   Ileus (HCC)   C. difficile diarrhea   Sepsis (HCC)   Pressure injury of skin   Hyponatremia   Severe protein-calorie malnutrition (HCC)   ABLA (acute blood loss anemia)   Pulmonary embolus (HCC)   Splenic infarct   Bandemia  Septic shock multifactorial Intra-abdominal abscess Fungemia 2/2 Candida glabrata Septic emboli Abdominal pain Abdominal wound: Septic shock multifactorial due to C. difficile colitis fungemia and intramural abscess- now resolved S/p laparotomy with total colectomy with ileostomy and LLQ JP drain in [facility], wound care following.   Work up negative for endocarditis in  TTE/TEE ID recommended: 6 wks of micafungin from 02/16/23, vancomycin followed by taper for C. Difficile Seen by ophthalmology  10/23 no fungal infiltrate on funduscopic exam Left subclavian tunneled  CVC placed 02/21/2023 with IR. Afebrile and leukocytosis resolved to 9.1- waiting for placement. Continue oral Dilaudid, increase fentanyl patch to 50 mcg, continue home Flexeril.  Minimize IV pain medication.   Continue to mobilize. If unable to find SNF-she wants to go home, per ID okay for home health and IV micafungin. Cont PTOT.  Hyponatremia: Likely multifactorial level has been fluctuating 126-131 check urine osmole and sodium, encourage oral intake Recent Labs  Lab 03/11/23 0555 03/12/23 0640 03/14/23 0625 03/15/23 0440 03/17/23 0500  NA 126* 132* 132* 130* 126*     Left-sided chest pain: EKG and troponin negative.resolved  Headache: Attributed to oxycodone-stopped and it resolved.  Inadequate oral intake: Augment nutritional status. RD following.  Postoperative short bowel syndrome/high output ostomy Malabsorption likely acute on chronic: Improving.  Continue lomotil qid,Imodium qid. Encourage oral intake and augment. Not a candidate for TPN due to fungemia   Sinus tachycardia: In the setting of acute illness possible dehydration, and pain,TSH stable. had some pericardial effusion without tamponade.  Repeat CT on 10/25 did not reveal pericardial effusion.  HR stable now on ivabradine and metoprolol  Left-sided pleural effusion/iatrogenic pneumothorax, small:  s/p thoracocentesis on 10/27.Fluid is exudative by LDH.  Fluid culture NGTD.Subsequent chest x-ray on 10/28 without pneumothorax.  Cytology negative.  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 Cardell Peach with urology who recommended outpatient evaluation with cystoscopy   Acute pulmonary emboli/right upper extremity DVT: Now on DOAC, cont same   Thrombocytosis: Multifactorial. Evaluated by hematology and felt to be inflammatory response.  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   Resolving.  Follow-up outpatient    Longstanding Crohn's disease: Not chronically on steroids so less concern for adrenal insufficiency. S/p colectomy. Steroids/Skyrizi held in the setting of infection.  Slowly resume as outpatient.   Pericardial effusion without tamponade: Initially seen on TTE.  Was not seen on CTA on 10/25 . May need outpatient echocardiogram to confirm resolution.   Cardiac arrest secondary to septic shock requiring CPR on admission. stable   Type 2 diabetes mellitus with hyperglycemia  A1c 5.8  cbg well-controlled.   Recent Labs  Lab 03/13/23 1734 03/14/23 1150 03/14/23 1652 03/15/23 1302 03/15/23 1659  GLUCAP 140* 108* 101* 116* 95    Seizure disorder history:  Stable, continue her keppra/gabapentin   Anxiety disorder: Mood stable on home Xanax, Cymbalta, hydroxyzine and amitriptyline.   Hypertension: BP stable  on metoprolol. At times soft.   COPD history: Not in exacerbation, continue Yupelri daily and xopenex PRN   GERD KEEP PPI   Normocytic Anemia: Stable Stable chronic anemia, monitor and transfuse if less than 7 mg    Hypokalemia/hypomagnesemia Monitor and replete    Inadequate oral intake/weight loss: Nutrition Problem: Inadequate oral intake Etiology: inability to eat Signs/Symptoms: NPO status Interventions: Refer to RD note for recommendations    DVT prophylaxis: SCDs Start: 01/27/23 1111 Code Status:   Code Status: Full Code Family Communication: plan of care discussed with patient at bedside. Patient status is: Inpatient because of fungemia Level of care: Telemetry Cardiac   Dispo: The patient is from: home            Anticipated disposition: SNF vs HH ~ 2 DAYS  Objective: Vitals last 24 hrs: Vitals:   03/17/23 0825 03/17/23 0831 03/17/23 0841 03/17/23 0901  BP:  Marland Kitchen)  111/46 (!) 111/46 (!) 122/49  Pulse:  (!) 108 (!) 103   Resp:      Temp:  97.8 F (36.6 C)    TempSrc:  Oral    SpO2: 97% 97%    Weight:       Height:       Weight change:   Physical Examination: General exam: alert awake, oriented at baseline, older than stated age HEENT:Oral mucosa moist, Ear/Nose WNL grossly Respiratory system: Bilaterally clear BS,no use of accessory muscle Cardiovascular system: S1 & S2 +, No JVD. Gastrointestinal system: Abdomen soft,NT surgical site healing, colostomy with stool-now more solid Nervous System: Alert, awake, moving all extremities,and following commands. Extremities: LE edema neg,distal peripheral pulses palpable and warm.  Skin: No rashes,no icterus. MSK: Normal muscle bulk,tone, power   CENTRAL LINE+  Medications reviewed:  Scheduled Meds:  acetaminophen  650 mg Oral Q6H WA   amitriptyline  100 mg Oral QHS   apixaban  5 mg Oral BID   Chlorhexidine Gluconate Cloth  6 each Topical Daily   diphenoxylate-atropine  1 tablet Oral QID   DULoxetine  30 mg Oral Daily   fentaNYL  2 patch Transdermal Q72H   folic acid  1 mg Oral Daily   gabapentin  100 mg Oral TID   Gerhardt's butt cream   Topical BID   ivabradine  5 mg Oral BID WC   levETIRAcetam  750 mg Oral BID   lidocaine  2 patch Transdermal Q24H   loperamide  4 mg Oral QID   loratadine  10 mg Oral Daily   metoprolol tartrate  62.5 mg Oral BID   multivitamin with minerals  1 tablet Oral Daily   pantoprazole  40 mg Oral Daily   polycarbophil  1,250 mg Oral BID   revefenacin  175 mcg Nebulization Daily   sodium chloride flush  10-40 mL Intracatheter Q12H   thiamine  100 mg Oral Daily   vancomycin  125 mg Oral Daily   Followed by   Melene Muller ON 03/20/2023] vancomycin  125 mg Oral QODAY   Followed by   Melene Muller ON 03/28/2023] vancomycin  125 mg Oral Q3 days   Continuous Infusions:  magnesium sulfate bolus IVPB     micafungin (MYCAMINE) 150 mg in sodium chloride 0.9 % 100 mL IVPB 150 mg (03/17/23 0905)    Diet Order             Diet regular Room service appropriate? Yes; Fluid consistency: Thin  Diet effective now                   Intake/Output Summary (Last 24 hours) at 03/17/2023 1050 Last data filed at 03/17/2023 0900 Gross per 24 hour  Intake 1410 ml  Output 1835 ml  Net -425 ml  Net IO Since Admission: -31,878.21 mL [03/17/23 1050]  Wt Readings from Last 3 Encounters:  03/15/23 59.4 kg  11/13/22 63.5 kg  03/11/22 58.5 kg   Unresulted Labs (From admission, onward)     Start     Ordered   03/17/23 0845  Osmolality, urine  Once,   R        03/17/23 0844   03/17/23 0845  Sodium, urine, random  Once,   R        03/17/23 0844          Data Reviewed: I have personally reviewed following labs and imaging studies CBC: Recent Labs  Lab 03/12/23 0640 03/13/23 1156 03/14/23 0625 03/15/23 0440 03/17/23 0500  WBC 9.5 11.3* 9.1 9.4 8.0  NEUTROABS  --  8.2* 4.8  --   --   HGB 9.9* 10.6* 10.4* 10.6* 10.4*  HCT 32.0* 33.0* 34.1* 33.5* 31.9*  MCV 87.7 88.0 89.5 87.0 86.4  PLT 873* 775* 741* 677* 656*  Basic Metabolic Panel: Recent Labs  Lab 03/11/23 0555 03/12/23 0640 03/13/23 1156 03/14/23 0625 03/15/23 0440 03/17/23 0500  NA 126* 132*  --  132* 130* 126*  K 4.1 3.8  --  4.0 4.0 3.5  CL 91* 101  --  99 96* 92*  CO2 25 23  --  22 24 23   GLUCOSE 92 93  --  73 91 89  BUN 12 8  --  6 6 7   CREATININE 0.86 0.70  --  0.65 0.73 0.93  CALCIUM 10.0 9.2  --  10.0 10.3 9.6  MG 2.0 1.5* 1.5*  --   --  1.7  PHOS 3.7 3.5  --   --   --   --   [id number] Creatinine Clearance: 57.9 mL/min (by C-G formula based on SCr of 0.93 mg/dL). Liver Function Tests: Recent Labs  Lab 03/11/23 0555 03/12/23 0640 03/15/23 0440  AST  --   --  31  ALT  --   --  22  ALKPHOS  --   --  183*  BILITOT  --   --  0.3  PROT  --   --  6.4*  ALBUMIN 3.0* 2.7* 2.7*   Recent Labs  Lab 03/13/23 1734 03/14/23 1150 03/14/23 1652 03/15/23 1302 03/15/23 1659  GLUCAP 140* 108* 101* 116* 95  No results found for this or any previous visit (from the past 240 hour(s)).  Antimicrobials: Anti-infectives (From  admission, onward)    Start     Dose/Rate Route Frequency Ordered Stop   03/28/23 1000  vancomycin (VANCOCIN) capsule 125 mg       Placed in "Followed by" Linked Group   125 mg Oral Every 3 DAYS 03/01/23 0750 04/06/23 0959   03/20/23 1000  vancomycin (VANCOCIN) capsule 125 mg       Placed in "Followed by" Linked Group   125 mg Oral Every other day 03/01/23 0750 03/28/23 0959   03/13/23 1000  vancomycin (VANCOCIN) capsule 125 mg       Placed in "Followed by" Linked Group   125 mg Oral Daily 03/01/23 0750 03/20/23 0959   03/06/23 1000  vancomycin (VANCOCIN) capsule 125 mg       Placed in "Followed by" Linked Group   125 mg Oral 2 times daily 03/01/23 0750 03/12/23 2303   03/01/23 1000  vancomycin (VANCOCIN) capsule 125 mg       Placed in "Followed by" Linked Group   125 mg Oral 4 times daily 03/01/23 0750 03/05/23 2134   02/18/23 1800  vancomycin (VANCOCIN) 50 mg/mL oral solution SOLN 125 mg        125 mg Oral 4 times daily 02/18/23 1439 02/26/23 0959   02/18/23 0400  micafungin (MYCAMINE) 150 mg in sodium chloride 0.9 % 100 mL IVPB        150 mg 107.5 mL/hr over 1 Hours Intravenous Every 24 hours 02/17/23 0936 03/30/23 2359   02/17/23 0900  vancomycin (VANCOREADY) IVPB 750 mg/150 mL  Status:  Discontinued        750 mg 150 mL/hr over 60 Minutes Intravenous Every 8 hours 02/17/23 0204 02/17/23 0947   02/17/23 0400  micafungin (MYCAMINE) 100 mg in sodium chloride 0.9 %  100 mL IVPB  Status:  Discontinued        100 mg 105 mL/hr over 1 Hours Intravenous Every 24 hours 02/16/23 0353 02/17/23 0936   02/16/23 1045  vancomycin (VANCOCIN) 50 mg/mL oral solution SOLN 125 mg  Status:  Discontinued        125 mg Per Tube 4 times daily 02/16/23 0953 02/18/23 1439   02/16/23 0415  micafungin (MYCAMINE) 100 mg in sodium chloride 0.9 % 100 mL IVPB        100 mg 105 mL/hr over 1 Hours Intravenous STAT 02/16/23 0353 02/16/23 0610   02/15/23 1300  vancomycin (VANCOREADY) IVPB 750 mg/150 mL  Status:   Discontinued        750 mg 150 mL/hr over 60 Minutes Intravenous Every 12 hours 02/14/23 1156 02/17/23 0204   02/14/23 1130  vancomycin (VANCOCIN) IVPB 1000 mg/200 mL premix        1,000 mg 200 mL/hr over 60 Minutes Intravenous  Once 02/14/23 1043 02/14/23 1249   02/11/23 2000  cefTRIAXone (ROCEPHIN) 2 g in sodium chloride 0.9 % 100 mL IVPB  Status:  Discontinued       Note to Pharmacy: Pharmacy may adjust dosing strength / duration / interval for maximal efficacy   2 g 200 mL/hr over 30 Minutes Intravenous Every 24 hours 02/11/23 1622 02/28/23 0852   02/10/23 1600  metroNIDAZOLE (FLAGYL) IVPB 500 mg  Status:  Discontinued        500 mg 100 mL/hr over 60 Minutes Intravenous Every 12 hours 02/10/23 1444 02/28/23 0857   02/08/23 1800  vancomycin (VANCOCIN) 50 mg/mL oral solution SOLN 500 mg        500 mg Per Tube Every 6 hours 02/08/23 1325 02/12/23 1759   02/03/23 2200  metroNIDAZOLE (FLAGYL) tablet 500 mg        500 mg Per Tube Every 12 hours 02/03/23 1406 02/06/23 2148   02/02/23 1815  vancomycin (VANCOCIN) 50 mg/mL oral solution SOLN 125 mg  Status:  Discontinued        125 mg Per Tube Every 6 hours 02/02/23 1717 02/08/23 1325   02/02/23 1545  vancomycin (VANCOCIN) capsule 125 mg  Status:  Discontinued        125 mg Oral 4 times daily 02/02/23 1530 02/02/23 1717   01/27/23 1430  cefTRIAXone (ROCEPHIN) 2 g in sodium chloride 0.9 % 100 mL IVPB        2 g 200 mL/hr over 30 Minutes Intravenous Every 24 hours 01/27/23 1340 02/05/23 1403   01/27/23 1400  metroNIDAZOLE (FLAGYL) IVPB 500 mg  Status:  Discontinued        500 mg 100 mL/hr over 60 Minutes Intravenous Every 12 hours 01/27/23 1340 02/03/23 1406   01/27/23 0545  vancomycin (VANCOREADY) IVPB 1250 mg/250 mL        1,250 mg 166.7 mL/hr over 90 Minutes Intravenous  Once 01/27/23 0542 01/27/23 0809   01/27/23 0545  ceFEPIme (MAXIPIME) 2 g in sodium chloride 0.9 % 100 mL IVPB        2 g 200 mL/hr over 30 Minutes Intravenous  Once  01/27/23 0542 01/27/23 0621   01/27/23 0545  metroNIDAZOLE (FLAGYL) IVPB 500 mg        500 mg 100 mL/hr over 60 Minutes Intravenous  Once 01/27/23 0542 01/27/23 0741      Culture/Microbiology    Component Value Date/Time   SDES PLEURAL 02/27/2023 1300   SDES PLEURAL 02/27/2023 1300   SPECREQUEST LEFT  02/27/2023 1300   SPECREQUEST LEFT 02/27/2023 1300   CULT  02/27/2023 1300    NO GROWTH 5 DAYS Performed at [facility], [address] [place], [place] [address]    REPTSTATUS 03/04/2023 FINAL 02/27/2023 1300   REPTSTATUS 02/27/2023 FINAL 02/27/2023 1300    Radiology Studies: No results found.   LOS: 49 days   Lanae Boast, MD [facility]  03/17/2023, 10:50 AM

## 2023-03-17 NOTE — Plan of Care (Signed)
  Problem: Activity: Goal: Risk for activity intolerance will decrease Outcome: Progressing   Problem: Nutrition: Goal: Adequate nutrition will be maintained Outcome: Progressing   Problem: Coping: Goal: Level of anxiety will decrease Outcome: Progressing   Problem: Elimination: Goal: Will not experience complications related to bowel motility Outcome: Progressing   Problem: Elimination: Goal: Will not experience complications related to urinary retention Outcome: Progressing   Problem: Pain Managment: Goal: General experience of comfort will improve Outcome: Progressing   Problem: Safety: Goal: Ability to remain free from injury will improve Outcome: Progressing   

## 2023-03-17 NOTE — Consult Note (Addendum)
WOC Nurse ostomy follow up Stoma type/location: ileostomy Stomal assessment/size: Stoma is red and viable, flush with skin level, 1 1/4 inches. Intact skin surrounding.  Full thickness hypergranulation wound at 9:00 o'clock, covered with barrier ring to protect skin.  Approx .5X.5X.1cm, small amt bloody drainage.  Output: 100cc liquid brown stool; current pouch had overfilled and leaked onto the bed Pt was able to stretch and apply the barrier ring to the pouch and I assisted with applying the pouch over the stoma since she could not visualize while in bed.  She was able to open and close to empty.  Discussed pouching routines and ordering supplies. Applied flexible convex pouch and barrier ring.  5 sets of each supply ordered to the room:  Use Supplies: barrier ring, [employer] [place] # [phone] and convex pouch [place] # [phone] Educational materials at the bedside.  Pt was previously placed on [employer] but the package went to her home address and now she plans to discharge to her daughter's house.  I will request another package to be sent to that address with convex pouches.   Thank-you,  Cammie Mcgee MSN, RN, CWOCN, [place], CNS [phone]

## 2023-03-17 NOTE — TOC Progression Note (Signed)
Transition of Care (TOC) - Progression Note  Donn Pierini RN, BSN Transitions of Care Unit 4E- RN Case Manager See Treatment Team for direct phone #   Patient Details  Name: [name] MRN: [id number] Date of Birth: [birth year]  Transition of Care [facility]) CM/SW Contact  Zenda Alpers, Lenn Sink, RN Phone Number: 03/17/2023, 2:45 PM  Clinical Narrative:    Notified by CSW that pt no longer wants to look at [facility] for rehab and prefers to go home with daughter-  Address for daughter is: [address] [place], [place] [address]  Discussed with pt home needs for IV antifungals- and barriers to [facility] with insurance and being OON with area [facility] providers- no HH agency will accept for [facility] needs- pt will not have HH therapies- pt voiced understanding. Home infusion pharmacy- [facility] will be able to coordinate a nurse for PICC line care and home antifungal needs.  Pt is agreeable to use [facility] as per ID referral for home antifungal needs- stating she does not have preference. Understands she will only have HH for IV antifungal needs.  [facility] liaison will reach out for bedside education prior to discharge.   Per pt she has a RW at home, would like a BSC and shower chair. Will have orders placed and refer to have delivered to room prior to discharge.   Pt voiced that her daughter can transport home, and assist with home IV abx needs.   TOC will follow for coordination of home infusion needs- EDD per MD is 2days  Expected Discharge Plan: Skilled Nursing Facility Barriers to Discharge: Continued Medical Work up  Expected Discharge Plan and Services In-house Referral: Clinical Social Work Discharge Planning Services: CM Consult Post Acute Care Choice: Horticulturist, commercial, Home Health Living arrangements for the past 2 months: Single Family Home                 DME Arranged: Bedside commode, Shower stool         HH Arranged: IV Antibiotics HH Agency: [facility] Date HH Agency Contacted:  03/17/23 Time HH Agency Contacted: 1444 Representative spoke with at [facility] Agency: Pam   Social Determinants of Health (SDOH) Interventions SDOH Screenings   Food Insecurity: No Food Insecurity (03/05/2023)  Housing: Low Risk  (03/05/2023)  Transportation Needs: No Transportation Needs (03/05/2023)  Utilities: Not At Risk (03/05/2023)  Financial Resource Strain: Medium Risk (10/18/2022)   Received from [facility]  Social Connections: Unknown (09/01/2021)   Received from [facility], [facility]  Stress: Stress Concern Present (06/25/2020)   Received from [facility], [facility]  Tobacco Use: High Risk (02/18/2023)    Readmission Risk Interventions     No data to display

## 2023-03-17 NOTE — Progress Notes (Signed)
Occupational Therapy Treatment Patient Details Name: [name] MRN: [id number] DOB: 1971-01-17 Today's Date: 03/17/2023   History of present illness 52 y.o. female admitted on 01/27/2023 after being found unresponsive at home down in a pool of bloody vomit/stool. CPR was initiated upon arrival to ED, pt intubated and started on pressors. Pt undergoing management for inflammatory vs infections colitis. Pt extubated on 10/2. 10/10 underwent repeat proctosigmoidoscopy for ongoing diarrhea. Diffuse severe inflammation identified due to combination of Crohn's disease, active C. difficile infection and possible ischemic colitis from prior hypotension on presentation. 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 10/13 became tachycardic and hypotensive.  More lethargic and transferred back to ICU. On 10/15, blood cultures revealed fungemia. On 10/17, CT of abdomen and pelvis notable for septic emboli. Successful Left subclavian CVC placement with IR on 10/21. PMH includes COPD, Crohn's disease, HTN, DMII.   OT comments  OT session focused on education in energy conservations strategies for increased safety and independence with functional tasks with handouts provided and with pt verbalizing understanding. Pt now plans to discharge to her daughter's home. Due to this change, OT also discussed home set-up at daughter's house and OT educated pt on use of recommended DME to increase safety and independence with pt verbalizing understanding and agreement. Pt will benefit from reinforcement of all training. Pt currently largely demonstrates ability to complete ADLs with Set up to Min assist. Pt participated well in session and is making progress toward goals. Pt will benefit from continued acute skilled OT services to address deficits outlined below, decrease caregiver burden, and increase safety and independence with functional tasks. Due to change in pt's discharge plan, post acute OT recommendations  updated this day. Post acute discharge, pt will benefit from continued skilled OT services in the home paired with 24/7 assistance available from family. Pt will also benefit from a 3-in-1, RW, and transport wheelchair to facility safe return home with family.       If plan is discharge home, recommend the following:  A little help with walking and/or transfers;A little help with bathing/dressing/bathroom;Assistance with cooking/housework;Assist for transportation;Help with stairs or ramp for entrance   Equipment Recommendations  BSC/3in1;Other (comment) (rolling walker; transport wheelchair)    Recommendations for Other Services      Precautions / Restrictions Precautions Precautions: Fall Precaution Comments: watch HR/RR, L JP drain, new colostomy Restrictions Weight Bearing Restrictions: No       Mobility Bed Mobility Overal bed mobility: Needs Assistance Bed Mobility: Sit to Supine, Rolling Rolling: Modified independent (Device/Increase time)     Sit to supine: Min assist, HOB elevated   General bed mobility comments: Pt received sitting EOB with NT present.    Transfers Overall transfer level: Needs assistance                 General transfer comment: not addressed this session.     Balance Overall balance assessment: Needs assistance Sitting-balance support: Feet supported, No upper extremity supported Sitting balance-Leahy Scale: Good                                     ADL either performed or assessed with clinical judgement   ADL Overall ADL's : Needs assistance/impaired  General ADL Comments: Pt with continued decreased activity tolerance but with noted improvements in activity tolerance this session as compared to last OT session. Pt currently largely completes ADLs with Set up to Min assist. OT educated pt in and provided handouts related to energy conservation strategies for  increased safety and independence with functional tasks with pt verbalizing understanding. As pt now plans to discharge home with daughter, OT also discussed home set up at daughter's house and educated pt in recommendations for DME for increased safety with functional tasks with pt verbalizing understanding and agreement.    Extremity/Trunk Assessment Upper Extremity Assessment Upper Extremity Assessment: Right hand dominant;Generalized weakness   Lower Extremity Assessment Lower Extremity Assessment: Defer to PT evaluation        Vision       Perception     Praxis      Cognition Arousal: Alert Behavior During Therapy: WFL for tasks assessed/performed Overall Cognitive Status: Within Functional Limits for tasks assessed                                 General Comments: AAOx4, mildly tangential, able to follow multi-step commands, and demonstrates good safety awareness and insight into deficits.        Exercises      Shoulder Instructions       General Comments Post acute, pt now plans to discharge to daughter's house. Home set-up in daughter's house includes: one story house; one step to porch and one step from porch to inside; tub/shower combo with hand held shower head; with daughter, SIL, and two grandchildren; 24/7 family support. Pt's VSS on RA throughout session. NT and RN each present for a portion of session.    Pertinent Vitals/ Pain       Pain Assessment Pain Assessment: Faces Faces Pain Scale: Hurts even more Pain Location: buttocks, abdomen Pain Descriptors / Indicators: Guarding, Discomfort, Grimacing Pain Intervention(s): Limited activity within patient's tolerance, Monitored during session, Repositioned, Patient requesting pain meds-RN notified, RN gave pain meds during session  Home Living                                          Prior Functioning/Environment              Frequency  Min 1X/week         Progress Toward Goals  OT Goals(current goals can now be found in the care plan section)  Progress towards OT goals: Progressing toward goals  Acute Rehab OT Goals Patient Stated Goal: to dischrge from hospital to daughter's house  Plan      Co-evaluation                 AM-PAC OT "6 Clicks" Daily Activity     Outcome Measure   Help from another person eating meals?: A Little Help from another person taking care of personal grooming?: A Little Help from another person toileting, which includes using toliet, bedpan, or urinal?: A Little Help from another person bathing (including washing, rinsing, drying)?: A Little Help from another person to put on and taking off regular upper body clothing?: A Little Help from another person to put on and taking off regular lower body clothing?: A Little 6 Click Score: 18    End of Session    OT Visit Diagnosis:  Unsteadiness on feet (R26.81);Muscle weakness (generalized) (M62.81);Pain;Other (comment) (decreased activity tolerance)   Activity Tolerance Patient tolerated treatment well   Patient Left in bed;with call bell/phone within reach;with nursing/sitter in room   Nurse Communication Mobility status        Time: 1428-1510 OT Time Calculation (min): 42 min  Charges: OT General Charges $OT Visit: 1 Visit OT Treatments $Self Care/Home Management : 38-52 mins  Johaan Ryser "Orson Eva., OTR/L, MA Acute Rehab [phone]   Lendon Colonel 03/17/2023, 5:29 PM

## 2023-03-17 NOTE — Progress Notes (Signed)
Replace mg with an additional 1g and increase fentanyl patch to q72hr per Dr. Angus Seller, PharmD, BCIDP, AAHIVP, CPP Infectious Disease Pharmacist 03/17/2023 10:02 AM

## 2023-03-18 LAB — BASIC METABOLIC PANEL
Anion gap: 9 (ref 5–15)
BUN: 9 mg/dL (ref 6–20)
CO2: 25 mmol/L (ref 22–32)
Calcium: 9.6 mg/dL (ref 8.9–10.3)
Chloride: 94 mmol/L — ABNORMAL LOW (ref 98–111)
Creatinine, Ser: 0.66 mg/dL (ref 0.44–1.00)
GFR, Estimated: >60 mL/min (ref >60)
Glucose, Bld: 87 mg/dL (ref 70–99)
Potassium: 3.6 mmol/L (ref 3.5–5.1)
Sodium: 128 mmol/L — ABNORMAL LOW (ref 135–145)

## 2023-03-18 MED ORDER — METOPROLOL TARTRATE 12.5 MG HALF TABLET
12.5000 mg | ORAL_TABLET | Freq: Two times a day (BID) | ORAL | Status: DC
  Administered 2023-03-18 – 2023-03-19 (×2): 12.5 mg via ORAL
  Filled 2023-03-18 (×2): qty 1

## 2023-03-18 MED ORDER — SODIUM CHLORIDE 0.9 % IV SOLN: INTRAVENOUS | Status: AC

## 2023-03-18 MED ORDER — MICAFUNGIN SODIUM 50 MG IV SOLR: 150.0000 mg | Freq: Every day | INTRAVENOUS | 0 refills | Status: AC

## 2023-03-18 NOTE — TOC Progression Note (Signed)
Transition of Care (TOC) - Progression Note  Donn Pierini RN, BSN Transitions of Care Unit 4E- RN Case Manager See Treatment Team for direct phone #   Patient Details  Name: [name] MRN: [id number] Date of Birth: [birth year]  Transition of Care [facility]) CM/SW Contact  Zenda Alpers, Lenn Sink, RN Phone Number: 03/18/2023, 11:48 AM  Clinical Narrative:    Cm has spoken with Home infusion liaison Pam this am- bedside education has been completed with pt and home infusion needs are in place for discharge. [facility] will follow for medication delivery, OPAT has been signed. [facility] will have RN to do home visit on Monday.   DME orders placed, pt also requesting RW now as the one she was borrowing the neighbor now needs and has picked it up.  [facility] liaison has been contacted and DME to be delivered to the room.   Per MD- EDD is potentially for tomorrow pending follow up labs in the am.  [facility] notified for infusion coordination.    Expected Discharge Plan: Skilled Nursing Facility Barriers to Discharge: Continued Medical Work up  Expected Discharge Plan and Services In-house Referral: Clinical Social Work Discharge Planning Services: CM Consult Post Acute Care Choice: Durable Medical Equipment, Home Health Living arrangements for the past 2 months: Single Family Home                 DME Arranged: Bedside commode, Shower stool, Walker rolling DME Agency: [facility] Date DME Agency Contacted: 03/18/23 Time DME Agency Contacted: 1030 Representative spoke with at DME Agency: [facility] HH Arranged: IV Antibiotics HH Agency: [facility] Date HH Agency Contacted: 03/17/23 Time HH Agency Contacted: 1444 Representative spoke with at [facility] Agency: Pam   Social Determinants of Health (SDOH) Interventions SDOH Screenings   Food Insecurity: No Food Insecurity (03/05/2023)  Housing: Low Risk  (03/05/2023)  Transportation Needs: No Transportation Needs (03/05/2023)  Utilities: Not At Risk  (03/05/2023)  Financial Resource Strain: Medium Risk (10/18/2022)   Received from [facility]  Social Connections: Unknown (09/01/2021)   Received from [facility], [facility]  Stress: Stress Concern Present (06/25/2020)   Received from [facility], [facility]  Tobacco Use: High Risk (02/18/2023)    Readmission Risk Interventions     No data to display

## 2023-03-18 NOTE — Progress Notes (Signed)
PROGRESS NOTE [name]  EAV:[id number] DOB: [birth year] DOA: 01/27/2023 PCP: [name], PA-C  Brief Narrative/Hospital Course: 78 yom w/ COPD, Crohn's on Skyrizi, hypertension, type 2 diabetes mellitus who initially presented to the after being found unresponsive. Per family, patient has had bloody emesis with diarrhea over the past few days. Initial systolics in the 60s in the ED, and patient was minimally responsive. In the emergency room, patient was intubated and admitted to the ICU for septic shock and encephalopathy on 01/27/2023.  For further evaluation and management for shock and altered mental status.  She was  found to have  C. difficile colitis, intra-abdominal infection with abscess and fungemia. ID consulted and started on prolonged p.o. vancomycin with taper and micafungin in addition to IV antibiotics for intra-abdominal abscess.  Eventually, she came off vasopressors on 9/29 and extubated on 10/2 and transferred to floor on 02/03/23.  She is with ongoing diarrhea and had repeat proctosigmoidoscopy that showed severe inflammation likely from underlying Crohn's disease, active C. difficile infection and possible ischemic colitis from prior hypotension on presentation. 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 She became tachycardic with hypotension and lethargy and transferred back to ICU on 02/13/23 Blood culture on 10/15 grew Candida glabrata for which she was started on micafungin. CT abdomen and pelvis on 10/16>>concerning for incidental small PE to RLL pulmonary artery branch raising concern for septic emboli. S/p TEE on 10/18 that was negative for endocarditis. S/p antibiotics course and p.o. vancomycin and on micafungin for fungemia for a total x 6 weeks from 10/16 as per ID  In regards to abdominal wound, wound VAC removed.  Surgery recommended  wet-to-dry dressing.  Surgery following weekly. Ptot Recommended CIR but turned down by CIR due lack of 24/7 supervision and need for prolonged rehabilitation. Plan is discharge to SNF once tachycardia and diarrhea fairly controlled AND titrating metoprolol added ivabradine on 03/09/23. Weaning of IV pain medication. ID has advised antibiotic with PICC line maintain Vanco trough 15-20 or AUC 400-5 50 duration 6 weeks end of treatment 03/30/2023. Fu w. Dr Daiva Eves scheduled on 03/29/23 at 10 am. Patient at this time wants to return with her daughter and manage antibiotics antifungal at home.    Subjective: Patient seen and examined this morning  Alert awake oriented no new complaints, sodium improving 128  Overnight afebrile on room air had episodes of low blood pressure last night  HR stable  She is persistent on going home instead of SNF  Assessment and Plan: Principal Problem:   Fungemia Active Problems:   Metabolic encephalopathy   Shock (HCC)   Cardiac arrest (HCC)   Acute respiratory failure with hypoxia (HCC)   Upper GI bleed   Granulomatous colitis (HCC)   Pseudomembranous colitis   Crohn's disease without complication (HCC)   Ileus (HCC)   C. difficile diarrhea   Sepsis (HCC)   Pressure injury of skin   Hyponatremia   Severe protein-calorie malnutrition (HCC)   ABLA (acute blood loss anemia)   Pulmonary embolus (HCC)   Splenic infarct   Bandemia  Septic shock multifactorial Intra-abdominal abscess Fungemia 2/2 Candida glabrata Septic emboli Abdominal pain Abdominal wound: Septic shock multifactorial due to C. difficile colitis fungemia and intramural abscess- now resolved S/p laparotomy with total colectomy with ileostomy and LLQ JP drain in [facility], wound care following.   Work up negative for endocarditis in  TTE/TEE-Seen by ophthalmology  10/23 no fungal infiltrate on funduscopic exam Left subclavian tunneled CVC placed 02/21/2023 with IR. ID recommended: 6  wks of micafungin from 02/16/23, 6 weeks end of treatment 03/30/2023. Fu w. Dr Daiva Eves scheduled on 03/29/23,10 am. Patient at this time wants to return with her daughter and manage antibiotics antifungal at home. Cont to taper off vancomycin  LLQ drain in place- cont drain care. Continue pain management with narcotics/nonnarcotics -on oral Dilaudid and fentanyl patch  at 50 mcg-we have had extensive discussions about need to wean off opiates at this time she is resistant and would like to continue explained risk of overdose/tolerance dependence  Hyponatremia: Likely multifactorial level has been fluctuating 126-131 urine osmole and sodium low, encourage hydration  add gentle ivf overnight Recent Labs  Lab 03/12/23 0640 03/14/23 0625 03/15/23 0440 03/17/23 0500 03/18/23 0500  NA 132* 132* 130* 126* 128*     Postoperative short bowel syndrome/high output ostomy Malabsorption likely acute on chronic: Overall clinically improved, tolerating diet.  Continue lomotil/Imodium, augment diet.Not a candidate for TPN due to fungemia   Sinus tachycardia: In the setting of acute illness possible dehydration, and pain,TSH stable. had some pericardial effusion without tamponade.  Repeat CT on 10/25 did not reveal pericardial effusion.  HR stable now on ivabradine and metoprolol-dose adjusted due to soft bp 11/14 night.  Left-sided pleural effusion/iatrogenic pneumothorax, small:  s/p thoracocentesis on 10/27.Fluid is exudative by LDH.  Fluid culture NGTD.Subsequent chest x-ray on 10/28 without pneumothorax.  Cytology negative.  Continue pulmonary toileting PTOT/  Left-sided chest pain: EKG and troponin negative.resolved Headache: Attributed to oxycodone-stopped and it resolved.   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 Cardell Peach with urology who recommended outpatient evaluation with cystoscopy   Acute pulmonary emboli/right  upper extremity DVT: Continue  eliquis   Thrombocytosis: Multifactorial. Evaluated by hematology and felt to be inflammatory response.  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  Improving.    Longstanding Crohn's disease: Not chronically on steroids so less concern for adrenal insufficiency. S/p colectomy. Steroids/Skyrizi held in the setting of infection.  Will She needs to follow-up with GI to discuss further plan on medications.   Pericardial effusion without tamponade: Initially seen on TTE.  Was not seen on CTA on 10/25 . May need outpatient echocardiogram to confirm resolution.   Cardiac arrest secondary to septic shock requiring CPR on admission. stable   Type 2 diabetes mellitus with hyperglycemia  A1c 5.8  cbg well-controlled.   Recent Labs  Lab 03/13/23 1734 03/14/23 1150 03/14/23 1652 03/15/23 1302 03/15/23 1659  GLUCAP 140* 108* 101* 116* 95    Seizure disorder history:  Stable, continue her keppra/gabapentin   Anxiety disorder: Mood stable on home Xanax, Cymbalta, hydroxyzine and amitriptyline.   Hypertension: BP stable  on metoprolol. At times soft.  Metoprolol dose adjusted   COPD history: Not in exacerbation, continue Yupelri daily and xopenex PRN   GERD KEEP PPI   Normocytic Anemia:  Stable chronic anemia, monitor and transfuse if less than 7 mg    Hypokalemia/hypomagnesemia Monitor and replete    Inadequate oral intake/weight loss: Nutrition Problem: Inadequate oral intake Etiology: inability to eat Signs/Symptoms: NPO status Interventions: Refer to RD note for recommendations   DVT prophylaxis: SCDs Start: 01/27/23 1111 Code Status:   Code Status: Full Code Family Communication: plan of care discussed with patient at bedside. Patient status is: Inpatient because of fungemia Level of care: Telemetry Cardiac  Dispo:The patient is from: home            Anticipated disposition: Anticipate discharge to home in next 24 to 48  hours   Objective: Vitals last 24 hrs: Vitals:   03/17/23 2347 03/18/23 0359 03/18/23 0803 03/18/23 0822  BP: (!) 88/58 (!) 107/49 120/68   Pulse: 88 73 98 98  Resp: 14 12 20    Temp: 98 F (36.7 C) 98.1 F (36.7 C) 97.7 F (36.5 C)   TempSrc: Oral Oral Oral   SpO2: 100% 100% 99% 99%  Weight:   59.3 kg   Height:       Weight change:   Physical Examination: General exam: alert awake, oriented HEENT:Oral mucosa moist, Ear/Nose WNL grossly Respiratory system: Bilaterally clear BS,no use of accessory muscle Cardiovascular system: S1 & S2 +, No JVD. Gastrointestinal system: Abdomen soft, surgical site healing,  RLQ w/ colostomy , LLQ w/ drain+ Nervous System: Alert, awake, moving all extremities,and following commands. Extremities: LE edema neg,distal peripheral pulses palpable and warm.  Skin: No rashes,no icterus. MSK: Normal muscle bulk,tone, power.  CENTRAL LINE+  Medications reviewed:  Scheduled Meds:  acetaminophen  650 mg Oral Q6H WA   amitriptyline  100 mg Oral QHS   apixaban  5 mg Oral BID   Chlorhexidine Gluconate Cloth  6 each Topical Daily   diphenoxylate-atropine  1 tablet Oral QID   DULoxetine  30 mg Oral Daily   fentaNYL  2 patch Transdermal Q72H   folic acid  1 mg Oral Daily   gabapentin  100 mg Oral TID   Gerhardt's butt cream   Topical BID   ivabradine  5 mg Oral BID WC   levETIRAcetam  750 mg Oral BID   lidocaine  2 patch Transdermal Q24H   loperamide  4 mg Oral QID   loratadine  10 mg Oral Daily   metoprolol tartrate  12.5 mg Oral BID   multivitamin with minerals  1 tablet Oral Daily   pantoprazole  40 mg Oral Daily   polycarbophil  1,250 mg Oral BID   revefenacin  175 mcg Nebulization Daily   sodium chloride flush  10-40 mL Intracatheter Q12H   sodium chloride  1 g Oral TID WC   thiamine  100 mg Oral Daily   vancomycin  125 mg Oral Daily   Followed by   Melene Muller ON 03/20/2023] vancomycin  125 mg Oral QODAY   Followed by   Melene Muller ON 03/28/2023]  vancomycin  125 mg Oral Q3 days  Continuous Infusions:  micafungin (MYCAMINE) 150 mg in sodium chloride 0.9 % 100 mL IVPB 150 mg (03/18/23 0847)    Diet Order             Diet regular Room service appropriate? Yes; Fluid consistency: Thin  Diet effective now                  Intake/Output Summary (Last 24 hours) at 03/18/2023 1115 Last data filed at 03/18/2023 0058 Gross per 24 hour  Intake 629.48 ml  Output 900 ml  Net -270.52 ml  Net IO Since Admission: -31,998.73 mL [03/18/23 1115]  Wt Readings from Last 3 Encounters:  03/18/23 59.3 kg  11/13/22 63.5 kg  03/11/22 58.5 kg   Unresulted Labs (From admission, onward)     Start     Ordered   03/18/23 0500  Basic metabolic panel  Daily,   R     Question:  Specimen collection method  Answer:  Unit=Unit collect  03/17/23 1147          Data Reviewed: I have personally reviewed following labs and imaging studies Recent Labs  Lab 03/12/23 0640 03/13/23 1156 03/14/23 0625 03/15/23 0440 03/17/23 0500  WBC 9.5 11.3* 9.1 9.4 8.0  NEUTROABS  --  8.2* 4.8  --   --   HGB 9.9* 10.6* 10.4* 10.6* 10.4*  HCT 32.0* 33.0* 34.1* 33.5* 31.9*  MCV 87.7 88.0 89.5 87.0 86.4  PLT 873* 775* 741* 677* 656*  Basic Metabolic Panel: Recent Labs  Lab 03/12/23 0640 03/13/23 1156 03/14/23 0625 03/15/23 0440 03/17/23 0500 03/18/23 0500  NA 132*  --  132* 130* 126* 128*  K 3.8  --  4.0 4.0 3.5 3.6  CL 101  --  99 96* 92* 94*  CO2 23  --  22 24 23 25   GLUCOSE 93  --  73 91 89 87  BUN 8  --  6 6 7 9   CREATININE 0.70  --  0.65 0.73 0.93 0.66  CALCIUM 9.2  --  10.0 10.3 9.6 9.6  MG 1.5* 1.5*  --   --  1.7  --   PHOS 3.5  --   --   --   --   --   YNW:[id number] Creatinine Clearance: 67.3 mL/min (by C-G formula based on SCr of 0.66 mg/dL).  Liver Function Tests: Recent Labs  Lab 03/12/23 0640 03/15/23 0440  AST  --  31  ALT  --  22  ALKPHOS  --  183*  BILITOT  --  0.3  PROT  --  6.4*  ALBUMIN 2.7* 2.7*   Recent Labs  Lab  03/13/23 1734 03/14/23 1150 03/14/23 1652 03/15/23 1302 03/15/23 1659  GLUCAP 140* 108* 101* 116* 95  No results found for this or any previous visit (from the past 240 hour(s)).  Antimicrobials: Anti-infectives (From admission, onward)    Start     Dose/Rate Route Frequency Ordered Stop   03/28/23 1000  vancomycin (VANCOCIN) capsule 125 mg       Placed in "Followed by" Linked Group   125 mg Oral Every 3 DAYS 03/01/23 0750 04/06/23 0959   03/20/23 1000  vancomycin (VANCOCIN) capsule 125 mg       Placed in "Followed by" Linked Group   125 mg Oral Every other day 03/01/23 0750 03/28/23 0959   03/18/23 0000  micafungin (MYCAMINE) 50 MG injection        150 mg Intravenous Daily 03/18/23 0842 04/02/23 2359   03/13/23 1000  vancomycin (VANCOCIN) capsule 125 mg       Placed in "Followed by" Linked Group   125 mg Oral Daily 03/01/23 0750 03/20/23 0959   03/06/23 1000  vancomycin (VANCOCIN) capsule 125 mg       Placed in "Followed by" Linked Group   125 mg Oral 2 times daily 03/01/23 0750 03/12/23 2303   03/01/23 1000  vancomycin (VANCOCIN) capsule 125 mg       Placed in "Followed by" Linked Group   125 mg Oral 4 times daily 03/01/23 0750 03/05/23 2134   02/18/23 1800  vancomycin (VANCOCIN) 50 mg/mL oral solution SOLN 125 mg        125 mg Oral 4 times daily 02/18/23 1439 02/26/23 0959   02/18/23 0400  micafungin (MYCAMINE) 150 mg in sodium chloride 0.9 % 100 mL IVPB        150 mg 107.5 mL/hr over 1 Hours Intravenous Every 24 hours 02/17/23 0936 03/30/23 2359  02/17/23 0900  vancomycin (VANCOREADY) IVPB 750 mg/150 mL  Status:  Discontinued        750 mg 150 mL/hr over 60 Minutes Intravenous Every 8 hours 02/17/23 0204 02/17/23 0947   02/17/23 0400  micafungin (MYCAMINE) 100 mg in sodium chloride 0.9 % 100 mL IVPB  Status:  Discontinued        100 mg 105 mL/hr over 1 Hours Intravenous Every 24 hours 02/16/23 0353 02/17/23 0936   02/16/23 1045  vancomycin (VANCOCIN) 50 mg/mL oral  solution SOLN 125 mg  Status:  Discontinued        125 mg Per Tube 4 times daily 02/16/23 0953 02/18/23 1439   02/16/23 0415  micafungin (MYCAMINE) 100 mg in sodium chloride 0.9 % 100 mL IVPB        100 mg 105 mL/hr over 1 Hours Intravenous STAT 02/16/23 0353 02/16/23 0610   02/15/23 1300  vancomycin (VANCOREADY) IVPB 750 mg/150 mL  Status:  Discontinued        750 mg 150 mL/hr over 60 Minutes Intravenous Every 12 hours 02/14/23 1156 02/17/23 0204   02/14/23 1130  vancomycin (VANCOCIN) IVPB 1000 mg/200 mL premix        1,000 mg 200 mL/hr over 60 Minutes Intravenous  Once 02/14/23 1043 02/14/23 1249   02/11/23 2000  cefTRIAXone (ROCEPHIN) 2 g in sodium chloride 0.9 % 100 mL IVPB  Status:  Discontinued       Note to Pharmacy: Pharmacy may adjust dosing strength / duration / interval for maximal efficacy   2 g 200 mL/hr over 30 Minutes Intravenous Every 24 hours 02/11/23 1622 02/28/23 0852   02/10/23 1600  metroNIDAZOLE (FLAGYL) IVPB 500 mg  Status:  Discontinued        500 mg 100 mL/hr over 60 Minutes Intravenous Every 12 hours 02/10/23 1444 02/28/23 0857   02/08/23 1800  vancomycin (VANCOCIN) 50 mg/mL oral solution SOLN 500 mg        500 mg Per Tube Every 6 hours 02/08/23 1325 02/12/23 1759   02/03/23 2200  metroNIDAZOLE (FLAGYL) tablet 500 mg        500 mg Per Tube Every 12 hours 02/03/23 1406 02/06/23 2148   02/02/23 1815  vancomycin (VANCOCIN) 50 mg/mL oral solution SOLN 125 mg  Status:  Discontinued        125 mg Per Tube Every 6 hours 02/02/23 1717 02/08/23 1325   02/02/23 1545  vancomycin (VANCOCIN) capsule 125 mg  Status:  Discontinued        125 mg Oral 4 times daily 02/02/23 1530 02/02/23 1717   01/27/23 1430  cefTRIAXone (ROCEPHIN) 2 g in sodium chloride 0.9 % 100 mL IVPB        2 g 200 mL/hr over 30 Minutes Intravenous Every 24 hours 01/27/23 1340 02/05/23 1403   01/27/23 1400  metroNIDAZOLE (FLAGYL) IVPB 500 mg  Status:  Discontinued        500 mg 100 mL/hr over 60 Minutes  Intravenous Every 12 hours 01/27/23 1340 02/03/23 1406   01/27/23 0545  vancomycin (VANCOREADY) IVPB 1250 mg/250 mL        1,250 mg 166.7 mL/hr over 90 Minutes Intravenous  Once 01/27/23 0542 01/27/23 0809   01/27/23 0545  ceFEPIme (MAXIPIME) 2 g in sodium chloride 0.9 % 100 mL IVPB        2 g 200 mL/hr over 30 Minutes Intravenous  Once 01/27/23 0542 01/27/23 0621   01/27/23 0545  metroNIDAZOLE (FLAGYL) IVPB 500 mg  500 mg 100 mL/hr over 60 Minutes Intravenous  Once 01/27/23 0542 01/27/23 0741      Culture/Microbiology    Component Value Date/Time   SDES PLEURAL 02/27/2023 1300   SDES PLEURAL 02/27/2023 1300   SPECREQUEST LEFT 02/27/2023 1300   SPECREQUEST LEFT 02/27/2023 1300   CULT  02/27/2023 1300    NO GROWTH 5 DAYS Performed at [facility], [address], [place], [place] [address]    REPTSTATUS 03/04/2023 FINAL 02/27/2023 1300   REPTSTATUS 02/27/2023 FINAL 02/27/2023 1300    Radiology Studies: No results found.   LOS: 50 days   Lanae Boast, MD [facility]  03/18/2023, 11:15 AM

## 2023-03-18 NOTE — Progress Notes (Signed)
Physical Therapy Treatment Patient Details Name: [name] MRN: [id number] DOB: Nov 04, 1970 Today's Date: 03/18/2023   History of Present Illness 52 y.o. female admitted on 01/27/2023 after being found unresponsive at home down in a pool of bloody vomit/stool. CPR was initiated upon arrival to ED, pt intubated and started on pressors. Pt undergoing management for inflammatory vs infections colitis. Pt extubated on 10/2. 10/10 underwent repeat proctosigmoidoscopy for ongoing diarrhea. Diffuse severe inflammation identified due to combination of Crohn's disease, active C. difficile infection and possible ischemic colitis from prior hypotension on presentation. 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 10/13 became tachycardic and hypotensive.  More lethargic and transferred back to ICU. On 10/15, blood cultures revealed fungemia. On 10/17, CT of abdomen and pelvis notable for septic emboli. Successful Left subclavian CVC placement with IR on 10/21. PMH includes COPD, Crohn's disease, HTN, DMII.    PT Comments  Pt continues to progress towards updated goals. Pt currently Mod I for bed mobility, sit to stand and short distance gait. She continues to fatigue quickly. Due to pt current functional status recommending skilled physical therapy services 3x/weekly on discharge from acute care hospital setting in order to address endurance, strength and balance to decrease risk for falls, injury and re-hospitalization. Pt tolerated treatment session well.     If plan is discharge home, recommend the following: Assistance with cooking/housework;Assist for transportation;Help with stairs or ramp for entrance;A little help with walking and/or transfers   Can travel by private vehicle     No  Equipment Recommendations  Rolling walker (2 wheels)       Precautions / Restrictions Precautions Precautions: Fall Precaution Comments: watch HR/RR, L JP drain, new colostomy Restrictions Weight  Bearing Restrictions: No     Mobility  Bed Mobility Overal bed mobility: Modified Independent Bed Mobility: Supine to Sit, Sit to Supine     Supine to sit: Modified independent (Device/Increase time) Sit to supine: Modified independent (Device/Increase time)        Transfers Overall transfer level: Modified independent Equipment used: Rolling walker (2 wheels) Transfers: Sit to/from Stand Sit to Stand: Modified independent (Device/Increase time)      Ambulation/Gait Ambulation/Gait assistance: Modified independent (Device/Increase time) Gait Distance (Feet): 50 Feet Assistive device: Rolling walker (2 wheels) Gait Pattern/deviations: Decreased stride length, Step-through pattern Gait velocity: decreased slightly Gait velocity interpretation: <1.31 ft/sec, indicative of household ambulator   General Gait Details: short steps bil with minimal heel toe gait pattern.        Balance Overall balance assessment: Mild deficits observed, not formally tested Sitting-balance support: Feet supported, No upper extremity supported Sitting balance-Leahy Scale: Good Sitting balance - Comments: sitting EOB   Standing balance support: No upper extremity supported, Single extremity supported, During functional activity Standing balance-Leahy Scale: Good Standing balance comment: no overt LOB. minimal use of UE on RW        Cognition Arousal: Alert Behavior During Therapy: WFL for tasks assessed/performed Overall Cognitive Status: Within Functional Limits for tasks assessed           General Comments General comments (skin integrity, edema, etc.): Pt plans to discharge to daughter's house. ONe story house; one step to porch and one step from portch to inside, tub/shower combo with hand held shower head with daughter , Wilfrid Lund, and two grand chldre 24/7 family support.      Pertinent Vitals/Pain Pain Assessment Pain Assessment: Faces Faces Pain Scale: Hurts a little bit Facial  Expression: Relaxed, neutral Body Movements: Absence of movements  Muscle Tension: Relaxed Compliance with ventilator (intubated pts.): N/A Vocalization (extubated pts.): Talking in normal tone or no sound CPOT Total: 0 Pain Location: buttocks, abdomen Pain Descriptors / Indicators: Grimacing Pain Intervention(s): Monitored during session     PT Goals (current goals can now be found in the care plan section) Acute Rehab PT Goals Patient Stated Goal: home PT Goal Formulation: With patient Time For Goal Achievement: 04/13/23 Potential to Achieve Goals: Good Progress towards PT goals: Progressing toward goals    Frequency    Min 1X/week      PT Plan  Continue with current POC       AM-PAC PT "6 Clicks" Mobility   Outcome Measure  Help needed turning from your back to your side while in a flat bed without using bedrails?: None Help needed moving from lying on your back to sitting on the side of a flat bed without using bedrails?: None Help needed moving to and from a bed to a chair (including a wheelchair)?: None Help needed standing up from a chair using your arms (e.g., wheelchair or bedside chair)?: None Help needed to walk in hospital room?: None Help needed climbing 3-5 steps with a railing? : A Little 6 Click Score: 23    End of Session Equipment Utilized During Treatment: Gait belt Activity Tolerance: Patient tolerated treatment well Patient left: with call bell/phone within reach;in bed Nurse Communication: Mobility status PT Visit Diagnosis: Other abnormalities of gait and mobility (R26.89);Muscle weakness (generalized) (M62.81)     Time: 3295-1884 PT Time Calculation (min) (ACUTE ONLY): 14 min  Charges:    $Therapeutic Activity: 8-22 mins PT General Charges $$ ACUTE PT VISIT: 1 Visit                    Harrel Carina, DPT, CLT  Acute Rehabilitation Services Office: [phone] (Secure chat preferred)    [name] 03/18/2023, 12:54  PM

## 2023-03-18 NOTE — Plan of Care (Signed)
  Problem: Activity: Goal: Risk for activity intolerance will decrease Outcome: Progressing   Problem: Nutrition: Goal: Adequate nutrition will be maintained Outcome: Progressing   Problem: Coping: Goal: Level of anxiety will decrease Outcome: Progressing   Problem: Elimination: Goal: Will not experience complications related to bowel motility Outcome: Progressing   Problem: Elimination: Goal: Will not experience complications related to urinary retention Outcome: Progressing   Problem: Pain Managment: Goal: General experience of comfort will improve Outcome: Progressing   Problem: Skin Integrity: Goal: Risk for impaired skin integrity will decrease Outcome: Progressing

## 2023-03-19 ENCOUNTER — Other Ambulatory Visit (HOSPITAL_COMMUNITY): Payer: Self-pay

## 2023-03-19 LAB — BASIC METABOLIC PANEL
Anion gap: 9 (ref 5–15)
BUN: 6 mg/dL (ref 6–20)
CO2: 24 mmol/L (ref 22–32)
Calcium: 9.5 mg/dL (ref 8.9–10.3)
Chloride: 98 mmol/L (ref 98–111)
Creatinine, Ser: 0.55 mg/dL (ref 0.44–1.00)
GFR, Estimated: >60 mL/min (ref >60)
Glucose, Bld: 93 mg/dL (ref 70–99)
Potassium: 3.5 mmol/L (ref 3.5–5.1)
Sodium: 131 mmol/L — ABNORMAL LOW (ref 135–145)

## 2023-03-19 MED ORDER — THIAMINE HCL 100 MG PO TABS
100.0000 mg | ORAL_TABLET | Freq: Every day | ORAL | 0 refills | Status: AC
  Filled 2023-03-19: qty 30, 30d supply, fill #0

## 2023-03-19 MED ORDER — IVABRADINE HCL 5 MG PO TABS
5.0000 mg | ORAL_TABLET | Freq: Two times a day (BID) | ORAL | 0 refills | Status: DC
  Filled 2023-03-19: qty 60, 30d supply, fill #0

## 2023-03-19 MED ORDER — LIDOCAINE 5 % EX PTCH
2.0000 | MEDICATED_PATCH | CUTANEOUS | 0 refills | Status: DC
  Filled 2023-03-19: qty 30, 15d supply, fill #0

## 2023-03-19 MED ORDER — LOPERAMIDE HCL 2 MG PO CAPS
4.0000 mg | ORAL_CAPSULE | Freq: Four times a day (QID) | ORAL | 0 refills | Status: DC
  Filled 2023-03-19: qty 30, 4d supply, fill #0

## 2023-03-19 MED ORDER — DIPHENOXYLATE-ATROPINE 2.5-0.025 MG PO TABS
1.0000 | ORAL_TABLET | Freq: Four times a day (QID) | ORAL | 0 refills | Status: DC
  Filled 2023-03-19: qty 30, 8d supply, fill #0

## 2023-03-19 MED ORDER — HEPARIN SOD (PORK) LOCK FLUSH 100 UNIT/ML IV SOLN
250.0000 [IU] | INTRAVENOUS | Status: AC | PRN
  Administered 2023-03-19: 250 [IU]

## 2023-03-19 MED ORDER — HYDROMORPHONE HCL 2 MG PO TABS
2.0000 mg | ORAL_TABLET | Freq: Four times a day (QID) | ORAL | 0 refills | Status: AC | PRN
  Filled 2023-03-19: qty 20, 5d supply, fill #0

## 2023-03-19 MED ORDER — BISMUTH SUBSALICYLATE 262 MG/15ML PO SUSP: 30.0000 mL | ORAL | 0 refills | Status: DC | PRN

## 2023-03-19 MED ORDER — BISMUTH SUBSALICYLATE 262 MG/15ML PO SUSP
30.0000 mL | ORAL | 0 refills | Status: DC | PRN
  Filled 2023-03-19: qty 360, 2d supply, fill #0

## 2023-03-19 MED ORDER — CALCIUM POLYCARBOPHIL 625 MG PO TABS
1250.0000 mg | ORAL_TABLET | Freq: Two times a day (BID) | ORAL | 0 refills | Status: DC
  Filled 2023-03-19: qty 60, 15d supply, fill #0

## 2023-03-19 MED ORDER — FENTANYL 25 MCG/HR TD PT72
2.0000 | MEDICATED_PATCH | TRANSDERMAL | 0 refills | Status: DC
  Filled 2023-03-19: qty 6, 9d supply, fill #0

## 2023-03-19 MED ORDER — APIXABAN 5 MG PO TABS
5.0000 mg | ORAL_TABLET | Freq: Two times a day (BID) | ORAL | 0 refills | Status: DC
  Filled 2023-03-19: qty 60, 30d supply, fill #0

## 2023-03-19 MED ORDER — VANCOMYCIN HCL 125 MG PO CAPS
ORAL_CAPSULE | ORAL | 0 refills | Status: DC
  Filled 2023-03-19: qty 7, 17d supply, fill #0

## 2023-03-19 MED ORDER — METOPROLOL TARTRATE 25 MG PO TABS
25.0000 mg | ORAL_TABLET | Freq: Two times a day (BID) | ORAL | Status: DC
  Administered 2023-03-19: 25 mg via ORAL
  Filled 2023-03-19: qty 1

## 2023-03-19 MED ORDER — FENTANYL 25 MCG/HR TD PT72: 1.0000 | MEDICATED_PATCH | TRANSDERMAL | 0 refills | Status: AC

## 2023-03-19 NOTE — Plan of Care (Signed)
?  Problem: Activity: °Goal: Risk for activity intolerance will decrease °Outcome: Progressing °  °Problem: Nutrition: °Goal: Adequate nutrition will be maintained °Outcome: Progressing °  °Problem: Coping: °Goal: Level of anxiety will decrease °Outcome: Progressing °  °Problem: Elimination: °Goal: Will not experience complications related to bowel motility °Outcome: Progressing °  °Problem: Elimination: °Goal: Will not experience complications related to urinary retention °Outcome: Progressing °  °Problem: Pain Managment: °Goal: General experience of comfort will improve °Outcome: Progressing °  °

## 2023-03-19 NOTE — Progress Notes (Signed)
Mobility Specialist Progress Note:   03/19/23 1042  Mobility  Activity Ambulated with assistance in hallway  Level of Assistance Standby assist, set-up cues, supervision of patient - no hands on  Assistive Device Front wheel walker  Distance Ambulated (ft) 250 ft  Activity Response Tolerated well  Mobility Referral Yes  $Mobility charge 1 Mobility  Mobility Specialist Start Time (ACUTE ONLY) 1042  Mobility Specialist Stop Time (ACUTE ONLY) 1100  Mobility Specialist Time Calculation (min) (ACUTE ONLY) 18 min   Pt agreeable to mobility session. No physical assistance required throughout ambulation in hallway. No complaints, pt eager to go home. Left sitting EOB.  Addison Lank Mobility Specialist Please contact via SecureChat or  Rehab office at [phone]

## 2023-03-19 NOTE — Discharge Summary (Addendum)
Physician Discharge Summary  [name] QIH:[id number] DOB: [birth year] DOA: 01/27/2023  PCP: [name], PA-C  Admit date: 01/27/2023 Discharge date: 03/19/2023 Recommendations for Outpatient Follow-up:  Follow up with PCP in 1 weeks-call for appointment Follow-up with general surgery as advised follow-up with home health. Follow-up with urology-Dr. Cardell Peach with urology who recommended outpatient evaluation with cystoscopy Please obtain BMP/CBC in one week  Discharge Dispo: Home w/ [facility] Discharge Condition: Stable Code Status:   Code Status: Full Code Diet recommendation:  Diet Order             Diet regular Room service appropriate? Yes; Fluid consistency: Thin  Diet effective now                    Brief/Interim Summary: 52 yom w/ COPD, Crohn's on Skyrizi, hypertension, type 2 diabetes mellitus who initially presented to the after being found unresponsive. Per family, patient has had bloody emesis with diarrhea over the past few days. Initial systolics in the 60s in the ED, and patient was minimally responsive. In the emergency room, patient was intubated and admitted to the ICU for septic shock and encephalopathy on 01/27/2023.  For further evaluation and management for shock and altered mental status.  She was  found to have  C. difficile colitis, intra-abdominal infection with abscess and fungemia. ID consulted and started on prolonged p.o. vancomycin with taper and micafungin in addition to IV antibiotics for intra-abdominal abscess.  Eventually, she came off vasopressors on 9/29 and extubated on 10/2 and transferred to floor on 02/03/23.  She is with ongoing diarrhea and had repeat proctosigmoidoscopy that showed severe inflammation likely from underlying Crohn's disease, active C. difficile infection and possible ischemic colitis from prior hypotension on presentation. 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 She became tachycardic with hypotension and lethargy and transferred back to ICU on 02/13/23 Blood culture on 10/15 grew Candida glabrata for which she was started on micafungin. CT abdomen and pelvis on 10/16>>concerning for incidental small PE to RLL pulmonary artery branch raising concern for septic emboli. S/p TEE on 10/18 that was negative for endocarditis. S/p antibiotics course and p.o. vancomycin and on micafungin for fungemia for a total x 6 weeks from 10/16 as per ID  In regards to abdominal wound, wound VAC removed.  Surgery recommended wet-to-dry dressing.  Surgery following weekly. Ptot Recommended CIR but turned down by CIR due lack of 24/7 supervision and need for prolonged rehabilitation. Plan is discharge to SNF once tachycardia and diarrhea fairly controlled AND titrating metoprolol added ivabradine on 03/09/23. Weaning of IV pain medication. ID has advised antibiotic with PICC line maintain Vanco trough 15-20 or AUC 400-5 50 duration 6 weeks end of treatment 03/30/2023. Fu w. Dr Daiva Eves scheduled on 03/29/23 at 10 am. Patient at this time wants to return with her daughter and manage antibiotics antifungal at home.  Patient has ambulated well with nursing staff and hemodynamically stable and will be discharged home today   Discharge Diagnoses:  Principal Problem:   Fungemia Active Problems:   Metabolic encephalopathy   Shock (HCC)   Cardiac arrest (HCC)   Acute respiratory failure with hypoxia (HCC)   Upper GI bleed   Granulomatous colitis (HCC)   Pseudomembranous colitis   Crohn's disease without complication (HCC)   Ileus (HCC)   C. difficile diarrhea   Sepsis (HCC)   Pressure injury of skin  Hyponatremia   Severe protein-calorie malnutrition (HCC)   ABLA (acute blood loss anemia)   Pulmonary embolus (HCC)   Splenic infarct   Bandemia  Septic shock multifactorial Intra-abdominal abscess Fungemia 2/2 Candida  glabrata Septic emboli Abdominal pain Abdominal wound: Septic shock multifactorial due to C. difficile colitis fungemia and intramural abscess- now resolved S/p laparotomy with total colectomy with ileostomy and LLQ JP drain in [facility], wound care following.   Work up negative for endocarditis in  TTE/TEE-Seen by ophthalmology 10/23 no fungal infiltrate on funduscopic exam Left subclavian tunneled CVC placed 02/21/2023 with IR. ID recommended: 6 wks of micafungin from 02/16/23, 6 weeks end of treatment 03/30/2023. Fu w. Dr Daiva Eves scheduled on 03/29/23,10 am. Patient at this time wants to return with her daughter and manage antibiotics antifungal at home. Cont to taper off vancomycin  LLQ drain in place- cont drain care. Continue pain management with narcotics/nonnarcotics -on oral Dilaudid and fentanyl patch  at 50 mcg-we have had extensive discussions about need to wean off opiates at this time  Hyponatremia: Likely multifactorial level has been fluctuating 126-131 urine osmole and sodium low.  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  Continue lomotil/Imodium, augment diet.Not a candidate for TPN due to fungemia   Sinus tachycardia: In the setting of acute illness possible dehydration, and pain,TSH stable. had some pericardial effusion without tamponade.  Repeat CT on 10/25 did not reveal pericardial effusion.  HR stable now on ivabradine and metoprolol-dose adjusted due to soft bp 11/14 night.   Left-sided pleural effusion/iatrogenic pneumothorax, small:  s/p thoracocentesis on 10/27.Fluid is exudative by LDH.  Fluid culture NGTD.Subsequent chest x-ray on 10/28 without pneumothorax.  Cytology negative.  Continue pulmonary toileting PTOT/   Left-sided chest pain: EKG and troponin negative.resolved Headache: Attributed to oxycodone-stopped and it  resolved.   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 Cardell Peach with urology who recommended outpatient evaluation with cystoscopy   Acute pulmonary emboli/right upper extremity DVT: Continue  eliquis   Thrombocytosis: Multifactorial. Evaluated by hematology and felt to be inflammatory response.  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  Improving.    Longstanding Crohn's disease: Not chronically on steroids so less concern for adrenal insufficiency. S/p colectomy. Steroids/Skyrizi held in the setting of infection. I discussed with GI team she will hold her Crohn's disease medication follow-up with [facility] for further recommendation   Pericardial effusion without tamponade: Initially seen on TTE.  Was not seen on CTA on 10/25 . May need outpatient echocardiogram to confirm resolution.   Cardiac arrest secondary to septic shock requiring CPR on admission. stable hemodynamically doing well   Type 2 diabetes mellitus with hyperglycemia  A1c 5.8  cbg well-controlled.  Continue diet  Seizure disorder history:  Stable, continue her keppra/gabapentin   Anxiety disorder: Mood stable on home Xanax, Cymbalta, hydroxyzine and amitriptyline.   Hypertension: BP stable  on metoprolol. At times soft.  New home metoprolol   COPD history: Not in exacerbation, continue Yupelri daily and xopenex PRN   GERD KEEP PPI   Normocytic Anemia:  Stable chronic anemia, monitor and transfuse if less than 7 mg    Hypokalemia/hypomagnesemia Monitor and replete    Inadequate oral intake/weight loss: Nutrition Problem: Inadequate oral intake Etiology: inability to eat Signs/Symptoms: NPO status Interventions: Refer to RD note for recommendations Will should be placed in  jail   Consults: Critical care, general surgery, infectious disease, ophthalmology cardiology, gastroenterology Subjective: Alert awake  oriented pain is controlled she is eager to go home today  Discharge Exam: Vitals:   03/19/23 0750 03/19/23 0950  BP: (!) 109/51 135/81  Pulse: (!) 119 (!) 102  Resp: 17 15  Temp: 98 F (36.7 C) (!) 97.3 F (36.3 C)  SpO2: 98% 97%   General: Pt is alert, awake, not in acute distress Cardiovascular: RRR, S1/S2 +, no rubs, no gallops Respiratory: CTA bilaterally, no wheezing, no rhonchi Abdominal: Soft, NT, ND, bowel sounds + Extremities: no edema, no cyanosis  Discharge Instructions  Discharge Instructions     Discharge instructions   Complete by: As directed    Please follow-up with general surgery primary care team, continue drain care as instructed by the surgical service and call the number provided  Please call call MD or return to ER for similar or worsening recurring problem that brought you to hospital or if any fever,nausea/vomiting,abdominal pain, uncontrolled pain, chest pain,  shortness of breath or any other alarming symptoms.  Please follow-up your doctor as instructed in a week time and call the office for appointment.  Please avoid alcohol, smoking, or any other illicit substance and maintain healthy habits including taking your regular medications as prescribed.  You were cared for by a hospitalist during your hospital stay. If you have any questions about your discharge medications or the care you received while you were in the hospital after you are discharged, you can call the unit and ask to speak with the hospitalist on call if the hospitalist that took care of you is not available.  Once you are discharged, your primary care physician will handle any further medical issues. Please note that NO REFILLS for any discharge medications will be authorized once you are discharged, as it is imperative that you return to your primary care physician (or establish a relationship with a primary care physician if you do not have one) for your aftercare needs so that they  can reassess your need for medications and monitor your lab values   Discharge wound care:   Complete by: As directed    Continue drain care and wound care as provided by surgery service   Home infusion instructions   Complete by: As directed    Instructions: Flushing of vascular access device: 0.9% NaCl pre/post medication administration and prn patency; Heparin 100 u/ml, 5ml for implanted ports and Heparin 10u/ml, 5ml for all other central venous catheters.   Increase activity slowly   Complete by: As directed       Allergies as of 03/19/2023       Reactions   Codeine Shortness Of Breath, Swelling, Rash   Throat swelling   Hydrocodone Shortness Of Breath, Swelling, Rash   Ketorolac Nausea And Vomiting, Swelling   Pt reports n/v and swelling to throat   Morphine And Codeine Shortness Of Breath, Swelling, Rash   Penicillins Shortness Of Breath, Swelling, Other (See Comments)   Throat swells 02/06/21--TOLERATES CEFTRIAXONE    Nickel Rash   Pregabalin Other (See Comments)   Acetaminophen    Per MD patient states she can't take Tylenol because of liver enzymes   Atarax [hydroxyzine] Itching, Swelling   Mildly swollen throat   Ativan [lorazepam] Other (See Comments)   Migraines.   Strawberry Extract Swelling   Watermelon Concentrate Swelling   Albuterol Palpitations   Benadryl [diphenhydramine Hcl] Palpitations   Humira [adalimumab] Rash  Latex Rash   Remicade [infliximab] Rash   Blisters and Welts   Tramadol Rash   Rash and itching        Medication List     STOP taking these medications    budesonide 3 MG 24 hr capsule Commonly known as: ENTOCORT EC   metoprolol tartrate 25 MG tablet Commonly known as: LOPRESSOR   omeprazole 40 MG capsule Commonly known as: PRILOSEC   polyethylene glycol powder 17 GM/SCOOP powder Commonly known as: GLYCOLAX/MIRALAX   Skyrizi 360 MG/2.4ML Soct Generic drug: Risankizumab-rzaa       TAKE these medications     acetaminophen 325 MG tablet Commonly known as: TYLENOL Take 2 tablets (650 mg total) by mouth every 6 (six) hours as needed for mild pain or headache (or Fever >/= 101). What changed: Another medication with the same name was removed. Continue taking this medication, and follow the directions you see here.   ALPRAZolam 1 MG tablet Commonly known as: XANAX Take 1 mg by mouth 3 (three) times daily as needed for anxiety.   amitriptyline 100 MG tablet Commonly known as: ELAVIL Take 100 mg by mouth at bedtime.   bismuth subsalicylate 262 MG/15ML suspension Commonly known as: PEPTO BISMOL Take 30 mLs by mouth every 4 (four) hours as needed for diarrhea or loose stools.   cetirizine 10 MG tablet Commonly known as: ZYRTEC Take 10 mg by mouth daily.   clotrimazole-betamethasone cream Commonly known as: LOTRISONE SMARTSIG:Sparingly Topical Twice Daily PRN   cyclobenzaprine 10 MG tablet Commonly known as: FLEXERIL Take 10 mg by mouth 3 (three) times daily as needed.   diphenoxylate-atropine 2.5-0.025 MG tablet Commonly known as: LOMOTIL Take 1 tablet by mouth 4 (four) times daily.   DULoxetine 30 MG capsule Commonly known as: CYMBALTA Take 30 mg by mouth daily.   Eliquis 5 MG Tabs tablet Generic drug: apixaban Take 1 tablet (5 mg total) by mouth 2 (two) times daily.   feeding supplement Liqd Take 237 mLs by mouth 2 (two) times daily between meals.   fentaNYL 25 MCG/HR Commonly known as: DURAGESIC Place 1 patch onto the skin every 3 (three) days for 3 doses. Start taking on: March 20, 2023   Fiber-Lax 625 MG tablet Generic drug: polycarbophil Take 2 tablets (1,250 mg total) by mouth 2 (two) times daily for 15 days.   folic acid 1 MG tablet Commonly known as: FOLVITE Take 1 tablet (1 mg total) by mouth daily.   gabapentin 300 MG capsule Commonly known as: NEURONTIN TAKE (1) CAPSULE BY MOUTH TWICE DAILY.   HYDROmorphone 2 MG tablet Commonly known as:  Dilaudid Take 1 tablet (2 mg total) by mouth every 6 (six) hours as needed for up to 5 days for severe pain (pain score 7-10).   hydrOXYzine 50 MG tablet Commonly known as: ATARAX Take 25-50 mg by mouth 3 (three) times daily as needed. What changed: Another medication with the same name was removed. Continue taking this medication, and follow the directions you see here.   ivabradine 5 MG Tabs tablet Commonly known as: CORLANOR Take 1 tablet (5 mg total) by mouth 2 (two) times daily with a meal.   levETIRAcetam 750 MG tablet Commonly known as: KEPPRA Take 750 mg by mouth 2 (two) times daily.   loperamide 2 MG capsule Commonly known as: IMODIUM Take 2 capsules (4 mg total) by mouth 4 (four) times daily.   metoprolol succinate 25 MG 24 hr tablet Commonly known as: Toprol XL Take 1  tablet (25 mg total) by mouth daily.   micafungin 50 MG injection Commonly known as: MYCAMINE Inject 150 mg into the vein daily for 15 days. Indication:  fungal endocarditis First Dose: Yes Last Day of Therapy:  03/30/23 Labs - Once weekly:  CBC/D and BMP, Labs - Once weekly: ESR and CRP   nitroGLYCERIN 0.4 MG SL tablet Commonly known as: NITROSTAT Place 1 tablet (0.4 mg total) under the tongue every 5 (five) minutes as needed for chest pain.   ondansetron 4 MG disintegrating tablet Commonly known as: ZOFRAN-ODT Take 4 mg by mouth every 8 (eight) hours as needed for nausea or vomiting.   pantoprazole 40 MG tablet Commonly known as: PROTONIX Take 1 tablet (40 mg total) by mouth 2 (two) times daily.   Restasis 0.05 % ophthalmic emulsion Generic drug: cycloSPORINE Place 1 drop into both eyes 2 (two) times daily.   Spiriva Respimat 2.5 MCG/ACT Aers Generic drug: Tiotropium Bromide Monohydrate Inhale 1 puff into the lungs daily.   Super Thera Vite M Tabs Take 1 tablet by mouth daily.   thiamine 100 MG tablet Commonly known as: VITAMIN B1 Take 1 tablet (100 mg total) by mouth daily. Start  taking on: March 20, 2023   triazolam 0.25 MG tablet Commonly known as: HALCION Take 0.25 mg by mouth daily.   vancomycin 125 MG capsule Commonly known as: VANCOCIN Take 1 capsule (125 mg total) by mouth every other day for 8 days, THEN 1 capsule (125 mg total) every 3 (three) days for 9 days. Start taking on: March 20, 2023   Vitamin D (Ergocalciferol) 1.25 MG (50000 UNIT) Caps capsule Commonly known as: DRISDOL Take 50,000 Units by mouth once a week. Takes on Wednesdays.               Home Infusion Instuctions  (From admission, onward)           Start     Ordered   03/18/23 0000  Home infusion instructions       Question:  Instructions  Answer:  Flushing of vascular access device: 0.9% NaCl pre/post medication administration and prn patency; Heparin 100 u/ml, 5ml for implanted ports and Heparin 10u/ml, 5ml for all other central venous catheters.   03/18/23 0842              Durable Medical Equipment  (From admission, onward)           Start     Ordered   03/18/23 1143  For home use only DME Walker rolling  Once       Question Answer Comment  Walker: With 5 Inch Wheels   Patient needs a walker to treat with the following condition Physical deconditioning      03/18/23 1142   03/17/23 1426  For home use only DME Bedside commode  Once       Question:  Patient needs a bedside commode to treat with the following condition  Answer:  Physical deconditioning   03/17/23 1425   03/17/23 1426  For home use only DME Shower stool  Once        03/17/23 1425              Discharge Care Instructions  (From admission, onward)           Start     Ordered   03/19/23 0000  Discharge wound care:       Comments: Continue drain care and wound care as provided by surgery service  03/19/23 1129            Contact information for follow-up providers     [name], PA-C Follow up.   Specialty: Physician Assistant Contact information: [address] [place] [address] [phone]         Harriette Bouillon, MD Follow up on 04/04/2023.   Specialty: General Surgery Why: 1:30pm, Arrive 30 minutes prior to your appointment time, Please bring your insurance card and photo ID Contact information: [address] [place] [address] [phone]         Daiva Eves, Lisette Grinder, MD Follow up.   Specialty: Infectious Diseases Why: 11/26 at 10 am. Please call to reschedule if you are not able to make this appointment. Contact information: [address] [place] [address] [phone]         [facility] Follow up.   Why: (Adapt)- rolling walker, bedside commode, shower chair- arranged- to be delivered to room prior to discharge Contact information: [address] [facility] [place] [address] [phone]         [facility] Follow up.   Why: Home IV infusion needs arranged for antifungal- they will provide education at bedside and coordinate for home needs including nursing to come out for PICC line care and medication delivery Contact information: [address], [place], [place] [address]  Phone: [phone]             Contact information for after-discharge care     Destination     [facility] .   Service: Skilled Nursing Contact information: [address] [place] [address] [phone]                    Allergies  Allergen Reactions   Codeine Shortness Of Breath, Swelling and Rash    Throat swelling   Hydrocodone Shortness Of Breath, Swelling and Rash   Ketorolac Nausea And Vomiting and Swelling    Pt reports n/v and swelling to throat   Morphine And Codeine Shortness Of Breath, Swelling and Rash   Penicillins Shortness Of Breath, Swelling and Other (See Comments)    Throat swells 02/06/21--TOLERATES CEFTRIAXONE     Nickel Rash   Pregabalin Other (See Comments)    Acetaminophen     Per MD patient states she can't take Tylenol because of liver enzymes   Atarax [Hydroxyzine] Itching and Swelling    Mildly swollen throat   Ativan [Lorazepam] Other (See Comments)    Migraines.   Strawberry Extract Swelling   Watermelon Concentrate Swelling   Albuterol Palpitations   Benadryl [Diphenhydramine Hcl] Palpitations   Humira [Adalimumab] Rash   Latex Rash   Remicade [Infliximab] Rash    Blisters and Welts    Tramadol Rash    Rash and itching    The results of significant diagnostics from this hospitalization (including imaging, microbiology, ancillary and laboratory) are listed below for reference.    Microbiology: No results found for this or any previous visit (from the past 240 hour(s)).  Procedures/Studies: DG CHEST PORT 1 VIEW  Result Date: 02/28/2023 CLINICAL DATA:  142230 Pleural effusion 142230. EXAM: PORTABLE CHEST 1 VIEW COMPARISON:  Chest radiograph 02/27/2023. FINDINGS: Unchanged left subclavian approach central venous catheter with tip projecting over the mid SVC. Unchanged consolidative opacity in the right lower lung. Unchanged small residual left pleural effusion with adjacent atelectasis in the left lung base. No pneumothorax. IMPRESSION: 1.  Unchanged consolidative opacity in the right lower lung. 2. Unchanged small residual left pleural effusion with adjacent atelectasis in the left lung base. Electronically Signed   By: Orvan Falconer M.D.   On: 02/28/2023 11:43   US THORACENTESIS ASP PLEURAL SPACE W/IMG GUIDE  Result Date: 02/28/2023 INDICATION: 52 year old female infectious colitis presents with shortness of breath, previous imaging showed left pleural effusion. Request for therapeutic and diagnostic thoracentesis. EXAM: ULTRASOUND GUIDED LEFT THORACENTESIS MEDICATIONS: 8 mL 1% lidocaine COMPLICATIONS: SIR Level A - No therapy, no consequence. PROCEDURE: An ultrasound guided thoracentesis was thoroughly discussed with the patient and  questions answered. The benefits, risks, alternatives and complications were also discussed. The patient understands and wishes to proceed with the procedure. Written consent was obtained. Ultrasound was performed to localize and mark an adequate pocket of fluid in the left chest. The area was then prepped and draped in the normal sterile fashion. 1% Lidocaine was used for local anesthesia. Under ultrasound guidance a 6 Fr Safe-T-Centesis catheter was introduced. Thoracentesis was performed. The catheter was removed and a dressing applied. FINDINGS: A total of approximately 450 mL of hazy brown fluid was removed. Samples were sent to the laboratory as requested by the clinical team. Post procedure chest x-ray showed tiny left lateral pneumothorax overlying the left mid and left lower lung. IMPRESSION: Successful ultrasound guided left thoracentesis yielding 450 mL of pleural fluid. Post procedure chest x-ray showed a tiny left lateral pneumothorax overlying the left mid and left lower lung. Patient was asymptomatic while she was in the ultrasound department. Attending provider and RN was notified, for repeat chest x-ray in a.m. Performed by: Lawernce Ion, PA-C Electronically Signed   By: Corlis Leak M.D.   On: 02/28/2023 07:24   DG Chest 1 View  Result Date: 02/27/2023 CLINICAL DATA:  Status post left thoracentesis. EXAM: CHEST  1 VIEW COMPARISON:  02/25/2023 FINDINGS: There is a left subclavian central venous catheter with tip in the distal SVC. Heart size is normal. Decreased volume of left pleural effusion status post thoracentesis. Tiny left lateral pneumothorax is identified on the order of 2-3 mm overlying the left mid and left lower lung. Opacity within the left base compatible with atelectasis or consolidation. Right lung appears clear. IMPRESSION: 1. Decreased volume of left pleural effusion status post thoracentesis. 2. Tiny left lateral pneumothorax on the order of 2-3 mm. 3. Left base atelectasis or  consolidation. Electronically Signed   By: Signa Kell M.D.   On: 02/27/2023 13:55   [place] CHEST (PLEURAL EFFUSION)  Result Date: 02/26/2023 INDICATION: 52 year old female who developed shortness of breath, previous imaging showed left pleural effusion. Request for therapeutic and diagnostic thoracentesis. EXAM: CHEST ULTRASOUND COMPARISON:  CTA chest dated 02/25/2023 FINDINGS: Left pleural space visualized with ultrasound which showed moderate pleural effusion. The benefits and risks of the therapeutic and diagnostic thoracentesis was discussed with the patient in detail. After thorough discussion and shared decision-making, patient declined thoracentesis. IMPRESSION: Moderate left pleural effusion. Patient declined the thoracentesis. Attending provider and nurse notified. Performed by: Lawernce Ion, PA-C Electronically Signed   By: Corlis Leak M.D.   On: 02/26/2023 15:15   CT ABDOMEN PELVIS W CONTRAST  Result Date: 02/25/2023 CLINICAL DATA:  Respiratory arrest.  Fungemia.  Pulmonary emboli. EXAM: CT ABDOMEN AND PELVIS WITH CONTRAST TECHNIQUE: Multidetector CT imaging of the abdomen and pelvis was performed using the standard protocol following bolus administration of intravenous contrast. RADIATION DOSE REDUCTION: This exam was performed according to the departmental dose-optimization program  which includes automated exposure control, adjustment of the mA and/or kV according to patient size and/or use of iterative reconstruction technique. CONTRAST:  50mL OMNIPAQUE IOHEXOL 350 MG/ML SOLN COMPARISON:  02/16/2023 and overlapping portion of CTA chest from 02/25/2023 FINDINGS: Lower chest: Moderate to large left and trace right pleural effusion with associated passive atelectasis. Small pericardial effusion. Hepatobiliary: Cholecystectomy. Common bile duct about 8 mm in diameter, mild prominence likely attributable to physiologic effect from prior cholecystectomy. Pancreas: Unremarkable Spleen: Reduced  size/conspicuity of previous splenic infarcts. No new infarct observed. Adrenals/Urinary Tract: There is contrast medium in the collecting systems, ureters, and urinary bladder left over from the earlier chest CT. This lowers sensitivity for nonobstructive renal calculi. Renal parenchyma appears normal bilaterally. No hydronephrosis. Stomach/Bowel: Periampullary duodenal diverticulum. There is some dilated loops of small bowel with air-levels extending to the margin of the enterostomy, small bowel loops of to 3.8 cm in diameter. Total colectomy. Vascular/Lymphatic: Aortocaval node 0.8 cm in short axis on image 32 series 3, upper normal size. Reproductive: Uterus absent. There is dense contrast medium in the vagina, etiology uncertain, query retrograde flow from urinary incontinence versus occult vesicovaginal fistula. Other: There continues to be a small amount of complex fluid along the left paracolic gutter below the spleen, along with adjacent hazy stranding in the adipose tissues especially in the left abdomen, although slightly improved. The drain looping down into the pelvis and back up along the left paracolic gutter is not changed. Near complete resolution of prior free intraperitoneal gas with only a tiny loculation of gas in the right abdomen on image 56 of series 3, and reduced scattered fluid collections along the mesentery. Stable presacral edema. Mildly reduced mesenteric edema. Laparotomy site noted. Musculoskeletal: No hip or SI joint effusion identified. Visualized spine unremarkable. IMPRESSION: 1. Near complete resolution of prior free intraperitoneal gas with only a tiny loculation of gas in the right abdomen. 2. Reduced scattered fluid collections along the mesentery. 3. Slightly reduced hazy stranding in the adipose tissues especially in the left abdomen. 4. Urinary bladder contrast left over from earlier chest CT. There is dense contrast medium in the vagina, etiology uncertain, query  retrograde flow from urinary incontinence versus occult vesicovaginal fistula. 5. Moderate to large left and trace right pleural effusions with associated passive atelectasis. 6. Small pericardial effusion. 7. Dilated loops of small bowel with air-levels extending to the margin of the enterostomy, small bowel loops of to 3.8 cm in diameter. Favor ileus over adhesion. 8. Reduced size/conspicuity of previous splenic infarcts. Electronically Signed   By: Gaylyn Rong M.D.   On: 02/25/2023 18:39   CT Angio Chest Pulmonary Embolism (PE) W or WO Contrast  Result Date: 02/25/2023 CLINICAL DATA:  History of pulmonary emboli. EXAM: CT ANGIOGRAPHY CHEST WITH CONTRAST TECHNIQUE: Multidetector CT imaging of the chest was performed using the standard protocol during bolus administration of intravenous contrast. Multiplanar CT image reconstructions and MIPs were obtained to evaluate the vascular anatomy. RADIATION DOSE REDUCTION: This exam was performed according to the departmental dose-optimization program which includes automated exposure control, adjustment of the mA and/or kV according to patient size and/or use of iterative reconstruction technique. CONTRAST:  75mL OMNIPAQUE IOHEXOL 350 MG/ML SOLN COMPARISON:  CT scan of February 16, 2023 and February 07, 2023. FINDINGS: Cardiovascular: As noted on prior CT scan, bilateral pulmonary emboli are noted in lower lobe branches of the pulmonary arteries which are not significantly changed compared to prior exam. Normal cardiac size. No pericardial effusion. Mediastinum/Nodes:  No enlarged mediastinal, hilar, or axillary lymph nodes. Thyroid gland, trachea, and esophagus demonstrate no significant findings. Lungs/Pleura: Stable moderate size left pleural effusion is noted with associated atelectasis of left lower lobe. No pneumothorax is noted. Interval development of right lower lobe opacity concerning for pneumonia or atelectasis. Upper Abdomen: No acute abnormality.  Musculoskeletal: No chest wall abnormality. No acute or significant osseous findings. Review of the MIP images confirms the above findings. IMPRESSION: Stable bilateral pulmonary emboli as noted on prior exam of February 16, 2023. Moderate size left pleural effusion is noted with associated atelectasis of left lower lobe. Interval development of right lower lobe airspace opacity concerning for pneumonia or atelectasis. Electronically Signed   By: Lupita Raider M.D.   On: 02/25/2023 13:40   IR Fluoro Guide CV Line Left  Result Date: 02/21/2023 INDICATION: Possible central line placement w/ bilateral UE DVT. Poor IV access. EXAM: ULTRASOUND AND FLUOROSCOPIC GUIDED PLACEMENT OF TUNNELED CENTRAL VENOUS CATHETER MEDICATIONS: None. ANESTHESIA/SEDATION: Local anesthetic was administered. FLUOROSCOPY TIME:  Fluoroscopic dose; 1 mGy COMPLICATIONS: None immediate. PROCEDURE: Informed written consent was obtained from the patient and/or patient's representative after a discussion of the risks, benefits, and alternatives to treatment. Questions regarding the procedure were encouraged and answered. A few attempts at RIGHT upper extremity peripherally inserted central catheter (PICC) placement were performed, with inability to thread the microwire into the axillary and subclavian vein. Tunneled central venous catheter placement was then recommended. The LEFT neck and chest were prepped with chlorhexidine in a sterile fashion, and a sterile drape was applied covering the operative field. Maximum barrier sterile technique with sterile gowns and gloves were used for the procedure. A timeout was performed prior to the initiation of the procedure. After the overlying soft tissues were anesthetized, a small venotomy incision was created and a micropuncture kit was utilized to access the internal jugular vein. Real-time ultrasound guidance was utilized for vascular access including the acquisition of a permanent ultrasound image  documenting patency of the accessed vessel. The microwire was utilized to measure appropriate catheter length. The micropuncture sheath was exchanged for a peel-away sheath over a guidewire. A 5 Fr dual lumen tunneled central venous catheter measuring 23 cm was tunneled in a retrograde fashion from the anterior chest wall to the venotomy incision. The catheter was then placed through the peel-away sheath with tip ultimately positioned at the superior caval-atrial junction. Final catheter positioning was confirmed and documented with a spot radiographic image. The catheter aspirates and flushes normally. The catheter was flushed with appropriate volume heparin dwells. The catheter exit site was secured with a 2-0 Ethilon retention suture. The venotomy incision was closed with Dermabond. Dressings were applied. The patient tolerated the procedure well without immediate post procedural complication. FINDINGS: *Aborted attempts at RIGHT upper extremity PICC placement. *After tunneled central venous catheter placement, the tip lies within the superior cavoatrial junction. The catheter aspirates and flushes normally and is ready for immediate use. IMPRESSION: Successful placement of 23 cm dual lumen tunneled central venous catheter via the LEFT internal jugular vein The tip of the catheter is positioned at the superior cavo-atrial junction. The catheter is ready for immediate use. Roanna Banning, MD [facility] Radiology Electronically Signed   By: Roanna Banning M.D.   On: 02/21/2023 14:52   IR US Guide Vasc Access Left  Result Date: 02/21/2023 INDICATION: Possible central line placement w/ bilateral UE DVT. Poor IV access. EXAM: ULTRASOUND AND FLUOROSCOPIC GUIDED PLACEMENT OF TUNNELED CENTRAL  VENOUS CATHETER MEDICATIONS: None. ANESTHESIA/SEDATION: Local anesthetic was administered. FLUOROSCOPY TIME:  Fluoroscopic dose; 1 mGy COMPLICATIONS: None immediate. PROCEDURE:  Informed written consent was obtained from the patient and/or patient's representative after a discussion of the risks, benefits, and alternatives to treatment. Questions regarding the procedure were encouraged and answered. A few attempts at RIGHT upper extremity peripherally inserted central catheter (PICC) placement were performed, with inability to thread the microwire into the axillary and subclavian vein. Tunneled central venous catheter placement was then recommended. The LEFT neck and chest were prepped with chlorhexidine in a sterile fashion, and a sterile drape was applied covering the operative field. Maximum barrier sterile technique with sterile gowns and gloves were used for the procedure. A timeout was performed prior to the initiation of the procedure. After the overlying soft tissues were anesthetized, a small venotomy incision was created and a micropuncture kit was utilized to access the internal jugular vein. Real-time ultrasound guidance was utilized for vascular access including the acquisition of a permanent ultrasound image documenting patency of the accessed vessel. The microwire was utilized to measure appropriate catheter length. The micropuncture sheath was exchanged for a peel-away sheath over a guidewire. A 5 Fr dual lumen tunneled central venous catheter measuring 23 cm was tunneled in a retrograde fashion from the anterior chest wall to the venotomy incision. The catheter was then placed through the peel-away sheath with tip ultimately positioned at the superior caval-atrial junction. Final catheter positioning was confirmed and documented with a spot radiographic image. The catheter aspirates and flushes normally. The catheter was flushed with appropriate volume heparin dwells. The catheter exit site was secured with a 2-0 Ethilon retention suture. The venotomy incision was closed with Dermabond. Dressings were applied. The patient tolerated the procedure well without immediate post  procedural complication. FINDINGS: *Aborted attempts at RIGHT upper extremity PICC placement. *After tunneled central venous catheter placement, the tip lies within the superior cavoatrial junction. The catheter aspirates and flushes normally and is ready for immediate use. IMPRESSION: Successful placement of 23 cm dual lumen tunneled central venous catheter via the LEFT internal jugular vein The tip of the catheter is positioned at the superior cavo-atrial junction. The catheter is ready for immediate use. Roanna Banning, MD [facility] Radiology Electronically Signed   By: Roanna Banning M.D.   On: 02/21/2023 14:52   [place] EKG SITE RITE  Result Date: 02/20/2023 If Site Rite image not attached, placement could not be confirmed due to current cardiac rhythm.  VAS [place] LOWER EXTREMITY VENOUS (DVT)  Result Date: 02/19/2023  Lower Venous DVT Study Patient Name:  [name]  Date of Exam:   02/19/2023 Medical Rec #: [id number]      Accession #:    [id number] Date of Birth: [birth year]      Patient Gender: F Patient Age:   89 years Exam Location:  [facility] Procedure:      VAS [place] LOWER EXTREMITY VENOUS (DVT) Referring Phys: CORNELIUS VAN DAM --------------------------------------------------------------------------------  Indications: Pulmonary embolism.  Risk Factors: Immobility confirmed PE Surgery laparotomy 02/11/23 past pregnancy. Comparison Study: No significant changes seen since previous exam 02/07/23 Performing Technologist: Shona Simpson  Examination Guidelines: A complete evaluation includes B-mode imaging, spectral Doppler, color Doppler, and power Doppler as needed of all accessible portions of each vessel. Bilateral testing is considered an integral part of a complete examination. Limited examinations for reoccurring indications may be performed as noted. The reflux portion of the exam is performed with the  patient in reverse Trendelenburg.   +---------+---------------+---------+-----------+----------+--------------+ RIGHT    CompressibilityPhasicitySpontaneityPropertiesThrombus Aging +---------+---------------+---------+-----------+----------+--------------+ CFV      Full           Yes      Yes                                 +---------+---------------+---------+-----------+----------+--------------+ SFJ      Full                                                        +---------+---------------+---------+-----------+----------+--------------+ FV Prox  Full                                                        +---------+---------------+---------+-----------+----------+--------------+ FV Mid   Full                                                        +---------+---------------+---------+-----------+----------+--------------+ FV DistalFull                                                        +---------+---------------+---------+-----------+----------+--------------+ PFV      Full                                                        +---------+---------------+---------+-----------+----------+--------------+ POP      Full           Yes      Yes                                 +---------+---------------+---------+-----------+----------+--------------+ PTV      Full                                                        +---------+---------------+---------+-----------+----------+--------------+ PERO     Full                                                        +---------+---------------+---------+-----------+----------+--------------+   +---------+---------------+---------+-----------+----------+--------------+ LEFT     CompressibilityPhasicitySpontaneityPropertiesThrombus Aging +---------+---------------+---------+-----------+----------+--------------+ CFV      Full           Yes      Yes                                  +---------+---------------+---------+-----------+----------+--------------+  SFJ      Full                                                        +---------+---------------+---------+-----------+----------+--------------+ FV Prox  Full                                                        +---------+---------------+---------+-----------+----------+--------------+ FV Mid   Full                                                        +---------+---------------+---------+-----------+----------+--------------+ FV DistalFull                                                        +---------+---------------+---------+-----------+----------+--------------+ PFV      Full                                                        +---------+---------------+---------+-----------+----------+--------------+ POP      Full           Yes      Yes                                 +---------+---------------+---------+-----------+----------+--------------+ PTV      Full                                                        +---------+---------------+---------+-----------+----------+--------------+ PERO     Full                                                        +---------+---------------+---------+-----------+----------+--------------+     Summary: BILATERAL: - No evidence of deep vein thrombosis seen in the lower extremities, bilaterally. -No evidence of popliteal cyst, bilaterally.   *See table(s) above for measurements and observations. Electronically signed by Sherald Hess MD on 02/19/2023 at 1:35:43 PM.    Final    VAS [place] UPPER EXTREMITY VENOUS DUPLEX  Result Date: 02/19/2023 UPPER VENOUS STUDY  Patient Name:  [name]  Date of Exam:   02/19/2023 Medical Rec #: [id number]      Accession #:    [id number] Date of Birth: 1970/11/09      Patient Gender: F Patient Age:   73 years Exam Location:  [facility] Procedure:      VAS [place] UPPER EXTREMITY VENOUS DUPLEX  Referring Phys: CORNELIUS VAN DAM --------------------------------------------------------------------------------  Indications: pulmonary embolism Risk Factors: Immobility confirmed PE Surgery laparotomy 02/11/23 past pregnancy. Limitations: Bandages, line and patient positioning. Comparison Study: Significant changes seen since prior exam 12/30/21 Performing Technologist: Shona Simpson  Examination Guidelines: A complete evaluation includes B-mode imaging, spectral Doppler, color Doppler, and power Doppler as needed of all accessible portions of each vessel. Bilateral testing is considered an integral part of a complete examination. Limited examinations for reoccurring indications may be performed as noted.  Right Findings: +----------+------------+---------+-----------+-------------------+-------+ RIGHT     CompressiblePhasicitySpontaneous    Properties     Summary +----------+------------+---------+-----------+-------------------+-------+ IJV           None       No        No     rigid w/compression Acute  +----------+------------+---------+-----------+-------------------+-------+ Subclavian    Full       Yes       Yes                               +----------+------------+---------+-----------+-------------------+-------+ Axillary      Full       Yes       Yes                               +----------+------------+---------+-----------+-------------------+-------+ Brachial    Partial      Yes       Yes    brightly echogenic  Acute  +----------+------------+---------+-----------+-------------------+-------+ Radial        Full       Yes       Yes                               +----------+------------+---------+-----------+-------------------+-------+ Ulnar         Full       Yes       Yes                               +----------+------------+---------+-----------+-------------------+-------+ Cephalic      Full       Yes       Yes                                +----------+------------+---------+-----------+-------------------+-------+ Basilic       Full       Yes       Yes                               +----------+------------+---------+-----------+-------------------+-------+  Left Findings: +----------+------------+---------+-----------+----------+-------+ LEFT      CompressiblePhasicitySpontaneousPropertiesSummary +----------+------------+---------+-----------+----------+-------+ IJV           Full                                          +----------+------------+---------+-----------+----------+-------+ Subclavian    Full                                          +----------+------------+---------+-----------+----------+-------+  Axillary      Full                                          +----------+------------+---------+-----------+----------+-------+ Brachial      None       Yes       Yes     dilated   Acute  +----------+------------+---------+-----------+----------+-------+ Radial        Full                                          +----------+------------+---------+-----------+----------+-------+ Ulnar         Full                                          +----------+------------+---------+-----------+----------+-------+ Cephalic      Full                                          +----------+------------+---------+-----------+----------+-------+ Basilic       Full                                          +----------+------------+---------+-----------+----------+-------+  Summary:  Right: No evidence of superficial vein thrombosis in the upper extremity. Findings consistent with acute deep vein thrombosis involving the right internal jugular vein and right brachial veins.  Left: No evidence of superficial vein thrombosis in the upper extremity. Findings consistent with acute deep vein thrombosis involving the left brachial veins.  *See table(s) above for measurements and observations.   Diagnosing physician: Sherald Hess MD Electronically signed by Sherald Hess MD on 02/19/2023 at 1:35:26 PM.    Final    ECHO TEE  Result Date: 02/18/2023    TRANSESOPHOGEAL ECHO REPORT   Patient Name:   [name] Date of Exam: 02/18/2023 Medical Rec #:  [id number]     Height:       63.0 in Accession #:    [id number]    Weight:       146.8 lb Date of Birth:  28-Jun-1970     BSA:          1.696 m Patient Age:    51 years      BP:           140/61 mmHg Patient Gender: F             HR:           111 bpm. Exam Location:  Inpatient Procedure: Transesophageal Echo, Cardiac Doppler, Color Doppler and Saline            Contrast Bubble Study Indications:     Candidemia. Splenic infarct.  History:         Patient has prior history of Echocardiogram examinations, most                  recent 02/07/2023. Sepsis and COPD, Arrythmias:Cardiac Arrest;                  Risk Factors:Current  Smoker.  Sonographer:     Delcie Roch RDCS Referring Phys:  [id number] Cyndi Bender Diagnosing Phys: Lennie Odor MD PROCEDURE: After discussion of the risks and benefits of a TEE, an informed consent was obtained from the patient. TEE procedure time was 10 minutes. The transesophogeal probe was passed without difficulty through the esophogus of the patient. Imaged were obtained with the patient in a left lateral decubitus position. Sedation performed by different physician. The patient was monitored while under deep sedation. Anesthestetic sedation was provided intravenously by Anesthesiology: 114mg  of Propofol. Image quality was excellent. The patient's vital signs; including heart rate, blood pressure, and oxygen saturation; remained stable throughout the procedure. The patient developed no complications during the procedure.  IMPRESSIONS  1. Negative TEE for endocarditis.  2. Left ventricular ejection fraction, by estimation, is 60 to 65%. The left ventricle has normal function.  3. Right ventricular systolic function is  normal. The right ventricular size is normal.  4. No left atrial/left atrial appendage thrombus was detected.  5. Moderate pericardial effusion. The pericardial effusion is anterior to the right ventricle.  6. The mitral valve is grossly normal. Trivial mitral valve regurgitation. No evidence of mitral stenosis.  7. The aortic valve is tricuspid. Aortic valve regurgitation is not visualized. No aortic stenosis is present.  8. Agitated saline contrast bubble study was negative, with no evidence of any interatrial shunt. Conclusion(s)/Recommendation(s): No evidence of vegetation/infective endocarditis on this transesophageael echocardiogram. FINDINGS  Left Ventricle: Left ventricular ejection fraction, by estimation, is 60 to 65%. The left ventricle has normal function. The left ventricular internal cavity size was normal in size. Right Ventricle: The right ventricular size is normal. No increase in right ventricular wall thickness. Right ventricular systolic function is normal. Left Atrium: Left atrial size was normal in size. No left atrial/left atrial appendage thrombus was detected. Right Atrium: Right atrial size was normal in size. Pericardium: A moderately sized pericardial effusion is present. The pericardial effusion is anterior to the right ventricle. Mitral Valve: The mitral valve is grossly normal. Trivial mitral valve regurgitation. No evidence of mitral valve stenosis. There is no evidence of mitral valve vegetation. Tricuspid Valve: The tricuspid valve is grossly normal. Tricuspid valve regurgitation is mild . No evidence of tricuspid stenosis. Aortic Valve: The aortic valve is tricuspid. Aortic valve regurgitation is not visualized. No aortic stenosis is present. There is no evidence of aortic valve vegetation. Pulmonic Valve: The pulmonic valve was grossly normal. Pulmonic valve regurgitation is trivial. No evidence of pulmonic stenosis. There is no evidence of pulmonic valve vegetation. Aorta: The  aortic root, ascending aorta, aortic arch and descending aorta are all structurally normal, with no evidence of dilitation or obstruction. Venous: The left upper pulmonary vein, left lower pulmonary vein, right lower pulmonary vein and right upper pulmonary vein are normal. IAS/Shunts: No atrial level shunt detected by color flow Doppler. Agitated saline contrast was given intravenously to evaluate for intracardiac shunting. Agitated saline contrast bubble study was negative, with no evidence of any interatrial shunt.   AORTA Ao Root diam: 3.39 cm Ao Asc diam:  2.92 cm Lennie Odor MD Electronically signed by Lennie Odor MD Signature Date/Time: 02/18/2023/12:56:06 PM    Final    EP STUDY  Result Date: 02/18/2023 See surgical note for result.  Labs: BNP (last 3 results) Recent Labs    01/27/23 2352  BNP 558.2*   Basic Metabolic Panel: Recent Labs  Lab 03/13/23 1156 03/14/23 0625 03/15/23 0440 03/17/23 0500 03/18/23 0500 03/19/23  0420  NA  --  132* 130* 126* 128* 131*  K  --  4.0 4.0 3.5 3.6 3.5  CL  --  99 96* 92* 94* 98  CO2  --  22 24 23 25 24   GLUCOSE  --  73 91 89 87 93  BUN  --  6 6 7 9 6   CREATININE  --  0.65 0.73 0.93 0.66 0.55  CALCIUM  --  10.0 10.3 9.6 9.6 9.5  MG 1.5*  --   --  1.7  --   --   Liver Function Tests: Recent Labs  Lab 03/15/23 0440  AST 31  ALT 22  ALKPHOS 183*  BILITOT 0.3  PROT 6.4*  ALBUMIN 2.7*   Recent Labs  Lab 03/13/23 1156 03/14/23 0625 03/15/23 0440 03/17/23 0500  WBC 11.3* 9.1 9.4 8.0  NEUTROABS 8.2* 4.8  --   --   HGB 10.6* 10.4* 10.6* 10.4*  HCT 33.0* 34.1* 33.5* 31.9*  MCV 88.0 89.5 87.0 86.4  PLT 775* 741* 677* 656*   Recent Labs  Lab 03/13/23 1734 03/14/23 1150 03/14/23 1652 03/15/23 1302 03/15/23 1659  GLUCAP 140* 108* 101* 116* 95  Anemia work up No results for input(s): "VITAMINB12", "FOLATE", "FERRITIN", "TIBC", "IRON", "RETICCTPCT" in the last 72 hours. Urinalysis    Component Value Date/Time    COLORURINE AMBER (A) 01/27/2023 0514   APPEARANCEUR HAZY (A) 01/27/2023 0514   LABSPEC 1.020 01/27/2023 0514   PHURINE 5.0 01/27/2023 0514   GLUCOSEU NEGATIVE 01/27/2023 0514   GLUCOSEU NEG mg/dL 52/84/1324 4010   HGBUR NEGATIVE 01/27/2023 0514   BILIRUBINUR NEGATIVE 01/27/2023 0514   KETONESUR 5 (A) 01/27/2023 0514   PROTEINUR 30 (A) 01/27/2023 0514   UROBILINOGEN 0.2 12/26/2013 1530   NITRITE NEGATIVE 01/27/2023 0514   LEUKOCYTESUR TRACE (A) 01/27/2023 0514   Sepsis Labs Recent Labs  Lab 03/13/23 1156 03/14/23 0625 03/15/23 0440 03/17/23 0500  WBC 11.3* 9.1 9.4 8.0   Microbiology No results found for this or any previous visit (from the past 240 hour(s)).  Time coordinating discharge: 35 minutes  SIGNED: Lanae Boast, MD  [facility] 03/19/2023, 1:41 PM  If 7PM-7AM, please contact night-coverage [email or web address]

## 2023-03-19 NOTE — TOC Transition Note (Signed)
Transition of Care [facility]) - CM/SW Discharge Note   Patient Details  Name: [name] MRN: [id number] Date of Birth: 1970-10-14  [facility] CM/SW Contact:  Ronny Bacon, RN Phone Number: 03/19/2023, 11:35 AM   Clinical Narrative:  Patient is being discharged today. Pam with [facility] is aware. Per floor nurse patient does not have RW at bedside. Call to [employer], spoke with Selena Batten, reports RW was not ordered yesterday. RW ordered for delivery to bedside today prior to patient leaving. Per floor nurse patient may need assistance getting home due to daughter having to go to work at [employer]. Will send cab voucher to floor, if daughter unable to transport patient before going to work.     Final next level of care: Home w Home Health Services Barriers to Discharge: Continued Medical Work up   Patient Goals and CMS Choice CMS Medicare.gov Compare Post Acute Care list provided to:: Patient Choice offered to / list presented to : Patient  Discharge Placement                         Discharge Plan and Services Additional resources added to the After Visit Summary for   In-house Referral: Clinical Social Work Discharge Planning Services: CM Consult Post Acute Care Choice: Durable Medical Equipment, Home Health          DME Arranged: Walker rolling (Per kim w/ [employer], RW was not ordered yesterday) DME Agency: [facility] Date DME Agency Contacted: 03/19/23 Time DME Agency Contacted: 2130 Representative spoke with at DME Agency: Selena Batten HH Arranged: IV Antibiotics HH Agency: [facility] Date HH Agency Contacted: 03/17/23 Time HH Agency Contacted: 1444 Representative spoke with at [facility] Agency: Pam  Social Determinants of Health (SDOH) Interventions SDOH Screenings   Food Insecurity: No Food Insecurity (03/05/2023)  Housing: Low Risk  (03/05/2023)  Transportation Needs: No Transportation Needs (03/05/2023)  Utilities: Not At Risk (03/05/2023)  Financial Resource Strain: Medium  Risk (10/18/2022)   Received from [facility]  Social Connections: Unknown (09/01/2021)   Received from [facility], [facility]  Stress: Stress Concern Present (06/25/2020)   Received from [facility], [facility]  Tobacco Use: High Risk (02/18/2023)     Readmission Risk Interventions     No data to display

## 2023-03-19 NOTE — Progress Notes (Signed)
Pt received RW and home meds. D/c tele. Changed ileostomy bag, jp dressing and subclavian dressing per pt requested. Went over AVS with pt and her daughter and all questions were addressed.  Lawson Radar, RN

## 2023-03-21 ENCOUNTER — Telehealth (HOSPITAL_COMMUNITY): Payer: Self-pay | Admitting: Pharmacy Technician

## 2023-03-21 ENCOUNTER — Other Ambulatory Visit (HOSPITAL_COMMUNITY): Payer: Self-pay

## 2023-03-21 NOTE — Telephone Encounter (Signed)
Pharmacy Patient Advocate Encounter   Received notification from Fax that prior authorization for fentaNYL 25MCG/HR 72 hr patches is required/requested.   Insurance verification completed.   The patient is insured through [facility] .   Per test claim: PA required; PA submitted to above mentioned insurance via CoverMyMeds Key/confirmation #/EOC [id number] Status is pending

## 2023-03-22 ENCOUNTER — Other Ambulatory Visit (HOSPITAL_COMMUNITY): Payer: Self-pay

## 2023-03-22 NOTE — Telephone Encounter (Signed)
Pharmacy Patient Advocate Encounter  Received notification from [facility] that Prior Authorization for fentaNYL 25MCG/HR 72 hr patches  has been APPROVED from 03/21/2023 to 06/19/2023   PA #/Case ID/Reference #: [id number]

## 2023-03-23 ENCOUNTER — Other Ambulatory Visit (HOSPITAL_COMMUNITY): Payer: Self-pay | Admitting: Nurse Practitioner

## 2023-03-23 DIAGNOSIS — Z433 Encounter for attention to colostomy: Secondary | ICD-10-CM

## 2023-03-26 ENCOUNTER — Emergency Department (HOSPITAL_COMMUNITY)
Admission: EM | Admit: 2023-03-26 | Discharge: 2023-03-27 | Disposition: A | Discharge status: Home / Self Care | Payer: Medicaid Other | Attending: Emergency Medicine | Admitting: Emergency Medicine

## 2023-03-26 ENCOUNTER — Other Ambulatory Visit: Payer: Self-pay

## 2023-03-26 ENCOUNTER — Encounter (HOSPITAL_COMMUNITY): Payer: Self-pay

## 2023-03-26 DIAGNOSIS — Z933 Colostomy status: Secondary | ICD-10-CM | POA: Insufficient documentation

## 2023-03-26 DIAGNOSIS — Z7901 Long term (current) use of anticoagulants: Secondary | ICD-10-CM | POA: Diagnosis not present

## 2023-03-26 DIAGNOSIS — J449 Chronic obstructive pulmonary disease, unspecified: Secondary | ICD-10-CM | POA: Diagnosis not present

## 2023-03-26 DIAGNOSIS — Z7951 Long term (current) use of inhaled steroids: Secondary | ICD-10-CM | POA: Insufficient documentation

## 2023-03-26 DIAGNOSIS — Z91040 Latex allergy status: Secondary | ICD-10-CM | POA: Diagnosis not present

## 2023-03-26 DIAGNOSIS — Z433 Encounter for attention to colostomy: Secondary | ICD-10-CM

## 2023-03-26 NOTE — ED Triage Notes (Signed)
Pt arrived via POV c/o needing a colostomy bag replacement. Pt reports having difficulty with her supplies at home, and does not have one in place during Triage.  Pt denies pain and presents in NAD.

## 2023-03-27 NOTE — Discharge Instructions (Signed)
Follow-up with your primary doctor for any additional issues.

## 2023-03-27 NOTE — ED Provider Notes (Signed)
EMERGENCY DEPARTMENT AT [facility] Provider Note   CSN: [id number] Arrival date & time: 03/26/23  2328     History  Chief Complaint  Patient presents with   colostomy bag replacement    [name] is a 52 y.o. female.  Patient is a 52 year old female with history of COPD, Crohn's disease, exploratory laparotomy with infection and sepsis.  Patient currently has a colostomy and presents for assistance with her ostomy bag.  She is apparently out of supplies at home and requesting to have her bag changed.  She denies to me she is having any abdominal pain, bloody stools, fevers, or other complaints.  The history is provided by the patient.       Home Medications Prior to Admission medications   Medication Sig Start Date End Date Taking? Authorizing Provider  acetaminophen (TYLENOL) 325 MG tablet Take 2 tablets (650 mg total) by mouth every 6 (six) hours as needed for mild pain or headache (or Fever >/= 101). 01/03/22   Vassie Loll, MD  ALPRAZolam Prudy Feeler) 1 MG tablet Take 1 mg by mouth 3 (three) times daily as needed for anxiety. 07/11/19   [provider]  amitriptyline (ELAVIL) 100 MG tablet Take 100 mg by mouth at bedtime.    [provider]  apixaban (ELIQUIS) 5 MG TABS tablet Take 1 tablet (5 mg total) by mouth 2 (two) times daily. 03/19/23 04/18/23  Lanae Boast, MD  bismuth subsalicylate (PEPTO BISMOL) 262 MG/15ML suspension Take 30 mLs by mouth every 4 (four) hours as needed for diarrhea or loose stools. 03/19/23   Lanae Boast, MD  cetirizine (ZYRTEC) 10 MG tablet Take 10 mg by mouth daily. 01/08/22   [provider]  clotrimazole-betamethasone (LOTRISONE) cream SMARTSIG:Sparingly Topical Twice Daily PRN    [provider]  cyclobenzaprine (FLEXERIL) 10 MG tablet Take 10 mg by mouth 3 (three) times daily as needed.    [provider]  diphenoxylate-atropine (LOMOTIL) 2.5-0.025 MG tablet Take 1 tablet by mouth 4  (four) times daily. 03/19/23   Lanae Boast, MD  DULoxetine (CYMBALTA) 30 MG capsule Take 30 mg by mouth daily. 12/17/21   [provider]  feeding supplement (ENSURE ENLIVE / ENSURE PLUS) LIQD Take 237 mLs by mouth 2 (two) times daily between meals. 01/04/22   Vassie Loll, MD  fentaNYL (DURAGESIC) 25 MCG/HR Place 1 patch onto the skin every 3 (three) days for 3 doses. 03/20/23 03/27/23  Lanae Boast, MD  folic acid (FOLVITE) 1 MG tablet Take 1 tablet (1 mg total) by mouth daily. 10/03/21   Catarina Hartshorn, MD  gabapentin (NEURONTIN) 300 MG capsule TAKE (1) CAPSULE BY MOUTH TWICE DAILY. 10/15/22   [provider]  hydrOXYzine (ATARAX) 50 MG tablet Take 25-50 mg by mouth 3 (three) times daily as needed.    [provider]  ivabradine (CORLANOR) 5 MG TABS tablet Take 1 tablet (5 mg total) by mouth 2 (two) times daily with a meal. 03/19/23 04/18/23  Lanae Boast, MD  levETIRAcetam (KEPPRA) 750 MG tablet Take 750 mg by mouth 2 (two) times daily. 09/30/20   [provider]  loperamide (IMODIUM) 2 MG capsule Take 2 capsules (4 mg total) by mouth 4 (four) times daily. 03/19/23   Lanae Boast, MD  metoprolol succinate (TOPROL XL) 25 MG 24 hr tablet Take 1 tablet (25 mg total) by mouth daily. 04/23/22   Jonelle Sidle, MD  micafungin (MYCAMINE) 50 MG injection Inject 150 mg into the vein daily  for 15 days. Indication:  fungal endocarditis First Dose: Yes Last Day of Therapy:  03/30/23 Labs - Once weekly:  CBC/D and BMP, Labs - Once weekly: ESR and CRP 03/18/23 04/02/23  Danelle Earthly, MD  Multiple Vitamins-Minerals (SUPER THERA VITE M) TABS Take 1 tablet by mouth daily. 12/13/21   [provider]  nitroGLYCERIN (NITROSTAT) 0.4 MG SL tablet Place 1 tablet (0.4 mg total) under the tongue every 5 (five) minutes as needed for chest pain. 03/19/21   Rehman, Joline Maxcy, MD  ondansetron (ZOFRAN-ODT) 4 MG disintegrating tablet Take 4 mg by mouth every 8 (eight) hours as needed for  nausea or vomiting. 09/07/21   [provider]  pantoprazole (PROTONIX) 40 MG tablet Take 1 tablet (40 mg total) by mouth 2 (two) times daily. 01/03/22   Vassie Loll, MD  polycarbophil (FIBERCON) 625 MG tablet Take 2 tablets (1,250 mg total) by mouth 2 (two) times daily for 15 days. 03/19/23 04/03/23  Lanae Boast, MD  RESTASIS 0.05 % ophthalmic emulsion Place 1 drop into both eyes 2 (two) times daily. 12/12/20   [provider]  SPIRIVA RESPIMAT 2.5 MCG/ACT AERS Inhale 1 puff into the lungs daily. 01/03/22   Vassie Loll, MD  thiamine (VITAMIN B1) 100 MG tablet Take 1 tablet (100 mg total) by mouth daily. 03/20/23 04/19/23  Lanae Boast, MD  triazolam (HALCION) 0.25 MG tablet Take 0.25 mg by mouth daily.    [provider]  vancomycin (VANCOCIN) 125 MG capsule Take 1 capsule (125 mg total) by mouth every other day for 8 days, THEN 1 capsule (125 mg total) every 3 (three) days for 9 days. 03/20/23 04/06/23  Lanae Boast, MD  Vitamin D, Ergocalciferol, (DRISDOL) 1.25 MG (50000 UNIT) CAPS capsule Take 50,000 Units by mouth once a week. Takes on Wednesdays. 12/14/21   [provider]      Allergies    Codeine, Hydrocodone, Ketorolac, Morphine and codeine, Penicillins, Nickel, Pregabalin, Acetaminophen, Atarax [hydroxyzine], Ativan [lorazepam], Strawberry extract, Watermelon concentrate, Albuterol, Benadryl [diphenhydramine hcl], Humira [adalimumab], Latex, Remicade [infliximab], and Tramadol    Review of Systems   Review of Systems  All other systems reviewed and are negative.   Physical Exam Updated Vital Signs BP 100/62 (BP Location: Left Arm)   Pulse (!) 105   Temp 97.6 F (36.4 C) (Oral)   Resp 16   Ht 5\' 3"  (1.6 m)   Wt 59.3 kg   SpO2 98%   BMI 23.16 kg/m  Physical Exam Vitals and nursing note reviewed.  Pulmonary:     Effort: Pulmonary effort is normal.  Skin:    General: Skin is warm and dry.  Neurological:     Mental Status: She is alert.     ED  Results / Procedures / Treatments   Labs (all labs ordered are listed, but only abnormal results are displayed) Labs Reviewed - No data to display  EKG None  Radiology No results found.  Procedures Procedures    Medications Ordered in ED Medications - No data to display  ED Course/ Medical Decision Making/ A&P  Patient presenting requesting assistance with her colostomy bag as described in the HPI.  A new bag was given and patient will be discharged with outpatient follow-up.  She is not having any abdominal complaints that I feel require any workup.  Final Clinical Impression(s) / ED Diagnoses Final diagnoses:  None    Rx / DC Orders ED Discharge Orders     None  Geoffery Lyons, MD 03/27/23 [phone]

## 2023-03-28 ENCOUNTER — Encounter: Payer: Self-pay | Admitting: Infectious Disease

## 2023-03-28 ENCOUNTER — Encounter (HOSPITAL_COMMUNITY): Payer: Self-pay | Admitting: Radiology

## 2023-03-28 ENCOUNTER — Emergency Department (HOSPITAL_COMMUNITY)
Admission: EM | Admit: 2023-03-28 | Discharge: 2023-03-28 | Disposition: A | Discharge status: Home / Self Care | Payer: Medicaid Other | Attending: Emergency Medicine | Admitting: Emergency Medicine

## 2023-03-28 ENCOUNTER — Other Ambulatory Visit: Payer: Self-pay

## 2023-03-28 DIAGNOSIS — Z7901 Long term (current) use of anticoagulants: Secondary | ICD-10-CM | POA: Diagnosis not present

## 2023-03-28 DIAGNOSIS — Z91040 Latex allergy status: Secondary | ICD-10-CM | POA: Insufficient documentation

## 2023-03-28 DIAGNOSIS — Z433 Encounter for attention to colostomy: Secondary | ICD-10-CM

## 2023-03-28 DIAGNOSIS — Z79899 Other long term (current) drug therapy: Secondary | ICD-10-CM | POA: Diagnosis not present

## 2023-03-28 DIAGNOSIS — I330 Acute and subacute infective endocarditis: Secondary | ICD-10-CM

## 2023-03-28 DIAGNOSIS — Z933 Colostomy status: Secondary | ICD-10-CM | POA: Insufficient documentation

## 2023-03-28 DIAGNOSIS — J449 Chronic obstructive pulmonary disease, unspecified: Secondary | ICD-10-CM | POA: Insufficient documentation

## 2023-03-28 HISTORY — DX: Acute and subacute infective endocarditis: I33.0

## 2023-03-28 NOTE — Progress Notes (Deleted)
Subjective:    Patient ID: [name], female    DOB: 1971-04-27, 52 y.o.   MRN: [id number]  HPI    Misc [facility] result COMMENT  Comment: (NOTE) Test Ordered: [id number] Antifungal AST 9 Drug Panel Yeast ID                       Note:                     BN     Candida glabrata                                           Identification performed by account, not confirmed by this laboratory. Amphotericin B MIC             0.5 ug/mL                 BN   Breakpoints have been established for only some organism-drug combinations as indicated. This test was developed and its performance characteristics determined by [facility]. It has not been cleared or approved by the Food and Drug Administration. Anidulafungin MIC              Comment                   BN   0.12 ug/mL Susceptible Breakpoints have been established for only some organism-drug combinations as indicated. This test was developed and its performance characteristics determined by [facility]. It has not been cleared or approved by the Food and Drug Administration. Caspofungin MIC                Comment                   BN   0.25 ug/mL Intermediate Breakpoints have been established for only some organism-drug combinations as indicated. This test was developed and its performance characteristics determined by [facility]. It has not been cleared or approved by the Food and Drug Administration. Fluconazole MIC                8.0 ug/mL                 BN   Susceptible Dose Dependent Breakpoints have been established for only some organism-drug combinations as indicated. This test was developed and its performance characteristics determined by [facility]. It has not been cleared or approved by the Food and Drug Administration. Flucytosine MIC                Note:                     BN     0.06 ug/mL or less                                         Breakpoints have been established for only some organism-drug combinations as  indicated. This test was developed and its performance characteristics determined by [facility]. It has not been cleared or approved by the Food and Drug Administration. Itraconazole MIC               0.5 ug/mL  BN   Breakpoints have been established for only some organism-drug combinations as indicated. This test was developed and its performance characteristics determined by [facility]. It has not been cleared or approved by the Food and Drug Administration. Micafungin MIC                 Comment                   BN   0.03 ug/mL Susceptible Breakpoints have been established for only some organism-drug combinations as indicated. This test was developed and its performance characteristics determined by [facility]. It has not been cleared or approved by the Food and Drug Administration. Posaconazole MIC               0.5 ug/mL                 BN   Breakpoints have been established for only some organism-drug combinations as indicated. This test was developed and its performance characteristics determined by [facility]. It has not been cleared or approved by the Food and Drug Administration. Voriconazole MIC               0.25 ug/mL                BN   Breakpoints have been established for only some organism-drug combinations as indicated. This test was developed and its performance characteristics determined by [facility]. It has not been cleared or approved by the Food and Drug Administration. Performed At: [facility] [address] [place], [place] [id number]    Past Medical History:  Diagnosis Date   Anxiety    Asthma    Chronic low back pain    Collagen vascular disease (HCC)    COPD (chronic obstructive pulmonary disease) (HCC)    Crohn's disease (HCC)    GERD (gastroesophageal reflux disease)    EGD 01/2007 by Dr.Rourke small hiatal hernia s/p 56 french maloney    History of head injury    Hypertension    IBS (irritable bowel syndrome)    PSVT (paroxysmal  supraventricular tachycardia) (HCC)    PTSD (post-traumatic stress disorder)    Recurrent chest pain    Seizure disorder (HCC)    Type 2 diabetes mellitus (HCC)     Past Surgical History:  Procedure Laterality Date   ABDOMINAL HYSTERECTOMY     with right salpingo oophorectomy 2005   APPENDECTOMY  2006   BALLOON DILATION N/A 02/19/2021   Procedure: BALLOON DILATION;  Surgeon: Lanelle Bal, DO;  [facility];  Service: Endoscopy;  Laterality: N/A;  Sigmoid colon stricture   BIOPSY  02/06/2021   Procedure: BIOPSY;  Surgeon: Lanelle Bal, DO;  [facility];  Service: Endoscopy;;   BIOPSY  02/19/2021   Procedure: BIOPSY;  Surgeon: Lanelle Bal, DO;  [facility];  Service: Endoscopy;;   BIOPSY  07/15/2021   Procedure: BIOPSY;  Surgeon: Malissa Hippo, MD;  [facility];  Service: Endoscopy;;   CESAREAN SECTION     1996   CHOLECYSTECTOMY     COLECTOMY N/A 02/11/2023   Procedure: TOTAL COLECTOMY;  Surgeon: Harriette Bouillon, MD;  Location: [facility];  Service: General;  Laterality: N/A;   COLONOSCOPY  2010   Dr. Jena Gauss; negative except for hemorrhoids   COLOSTOMY  02/11/2023   Procedure: COLOSTOMY;  Surgeon: Harriette Bouillon, MD;  Location: [facility];  Service: General;;   ESOPHAGEAL DILATION N/A 10/25/2014  Procedure: ESOPHAGEAL DILATION;  Surgeon: Malissa Hippo, MD;  [facility];  Service: Endoscopy;  Laterality: N/A;   ESOPHAGOGASTRODUODENOSCOPY N/A 10/25/2014   Procedure: ESOPHAGOGASTRODUODENOSCOPY (EGD);  Surgeon: Malissa Hippo, MD;  [facility];  Service: Endoscopy;  Laterality: N/A;  1250   FLEXIBLE SIGMOIDOSCOPY N/A 02/06/2021   Procedure: FLEXIBLE SIGMOIDOSCOPY;  Surgeon: Lanelle Bal, DO;  [facility];  Service: Endoscopy;  Laterality: N/A;   FLEXIBLE SIGMOIDOSCOPY N/A 02/19/2021   Procedure: FLEXIBLE SIGMOIDOSCOPY;  Surgeon: Lanelle Bal, DO;  [facility];  Service:  Endoscopy;  Laterality: N/A;   FLEXIBLE SIGMOIDOSCOPY N/A 02/02/2023   Procedure: FLEXIBLE SIGMOIDOSCOPY;  Surgeon: Beverley Fiedler, MD;  Location: [facility] ENDOSCOPY;  Service: Gastroenterology;  Laterality: N/A;   FLEXIBLE SIGMOIDOSCOPY N/A 02/10/2023   Procedure: FLEXIBLE SIGMOIDOSCOPY;  Surgeon: Sherrilyn Rist, MD;  Location: [facility] ENDOSCOPY;  Service: Gastroenterology;  Laterality: N/A;   IR FLUORO GUIDE CV LINE LEFT  02/21/2023   IR US GUIDE VASC ACCESS LEFT  02/21/2023   LAPAROTOMY N/A 02/11/2023   Procedure: EXPLORATORY LAPAROTOMY;  Surgeon: Harriette Bouillon, MD;  Location: [facility];  Service: General;  Laterality: N/A;   OOPHORECTOMY     left for torsion and ovarian fibroma; uterine myoma resected 1995   SIGMOIDOSCOPY  07/15/2021   Procedure: SIGMOIDOSCOPY;  Surgeon: Malissa Hippo, MD;  [facility];  Service: Endoscopy;;   TRANSESOPHAGEAL ECHOCARDIOGRAM (CATH LAB) N/A 02/18/2023   Procedure: TRANSESOPHAGEAL ECHOCARDIOGRAM;  Surgeon: Sande Rives, MD;  Location: [facility] INVASIVE CV LAB;  Service: Cardiovascular;  Laterality: N/A;    Family History  Problem Relation Age of Onset   Cancer Mother    Heart failure Mother    Hypertension Mother    Heart attack Mother    Heart attack Father 67   Cancer Father 26   Heart failure Father    Diabetes Father    Crohn's disease Cousin       Social History   Socioeconomic History   Marital status: Divorced    Spouse name: Not on file   Number of children: 1   Years of education: Not on file   Highest education level: Not on file  Occupational History   Not on file  Tobacco Use   Smoking status: Every Day    Current packs/day: 0.50    Average packs/day: 0.5 packs/day for 38.5 years (19.2 ttl pk-yrs)    Types: Cigarettes    Start date: 09/26/1984    Passive exposure: Current   Smokeless tobacco: Never  Vaping Use   Vaping status: Never Used  Substance and Sexual Activity   Alcohol use: No    Alcohol/week: 0.0  standard drinks of alcohol   Drug use: Yes    Types: Marijuana   Sexual activity: Yes    Birth control/protection: Surgical  Other Topics Concern   Not on file  Social History Narrative   Not on file   Social Determinants of Health   Financial Resource Strain: Medium Risk (10/18/2022)   Received from [facility]   Overall Financial Resource Strain (CARDIA)    Difficulty of Paying Living Expenses: Somewhat hard  Food Insecurity: No Food Insecurity (03/05/2023)   Hunger Vital Sign    Worried About Running Out of Food in the Last Year: Never true    Ran Out of Food in the Last Year: Never true  Transportation Needs: No Transportation Needs (03/05/2023)   PRAPARE - Transportation  Lack of Transportation (Medical): No    Lack of Transportation (Non-Medical): No  Physical Activity: Not on file  Stress: Stress Concern Present (06/25/2020)   Received from [facility], [facility] of Occupational Health - Occupational Stress Questionnaire    Feeling of Stress : To some extent  Social Connections: Unknown (09/01/2021)   Received from [facility], [facility]   Social Network    Social Network: Not on file    Allergies  Allergen Reactions   Codeine Shortness Of Breath, Swelling and Rash    Throat swelling   Hydrocodone Shortness Of Breath, Swelling and Rash   Ketorolac Nausea And Vomiting and Swelling    Pt reports n/v and swelling to throat   Morphine And Codeine Shortness Of Breath, Swelling and Rash   Penicillins Shortness Of Breath, Swelling and Other (See Comments)    Throat swells 02/06/21--TOLERATES CEFTRIAXONE     Nickel Rash   Pregabalin Other (See Comments)   Acetaminophen     Per MD patient states she can't take Tylenol because of liver enzymes   Atarax [Hydroxyzine] Itching and Swelling    Mildly swollen throat   Ativan [Lorazepam] Other (See Comments)    Migraines.   Strawberry Extract Swelling   Watermelon Concentrate Swelling    Albuterol Palpitations   Benadryl [Diphenhydramine Hcl] Palpitations   Humira [Adalimumab] Rash   Latex Rash   Remicade [Infliximab] Rash    Blisters and Welts    Tramadol Rash    Rash and itching     Current Outpatient Medications:    acetaminophen (TYLENOL) 325 MG tablet, Take 2 tablets (650 mg total) by mouth every 6 (six) hours as needed for mild pain or headache (or Fever >/= 101)., Disp: , Rfl:    ALPRAZolam (XANAX) 1 MG tablet, Take 1 mg by mouth 3 (three) times daily as needed for anxiety., Disp: , Rfl:    amitriptyline (ELAVIL) 100 MG tablet, Take 100 mg by mouth at bedtime., Disp: , Rfl:    apixaban (ELIQUIS) 5 MG TABS tablet, Take 1 tablet (5 mg total) by mouth 2 (two) times daily., Disp: 60 tablet, Rfl: 0   bismuth subsalicylate (PEPTO BISMOL) 262 MG/15ML suspension, Take 30 mLs by mouth every 4 (four) hours as needed for diarrhea or loose stools., Disp: 360 mL, Rfl: 0   cetirizine (ZYRTEC) 10 MG tablet, Take 10 mg by mouth daily., Disp: , Rfl:    clotrimazole-betamethasone (LOTRISONE) cream, SMARTSIG:Sparingly Topical Twice Daily PRN, Disp: , Rfl:    cyclobenzaprine (FLEXERIL) 10 MG tablet, Take 10 mg by mouth 3 (three) times daily as needed., Disp: , Rfl:    diphenoxylate-atropine (LOMOTIL) 2.5-0.025 MG tablet, Take 1 tablet by mouth 4 (four) times daily., Disp: 30 tablet, Rfl: 0   DULoxetine (CYMBALTA) 30 MG capsule, Take 30 mg by mouth daily., Disp: , Rfl:    feeding supplement (ENSURE ENLIVE / ENSURE PLUS) LIQD, Take 237 mLs by mouth 2 (two) times daily between meals., Disp: , Rfl:    folic acid (FOLVITE) 1 MG tablet, Take 1 tablet (1 mg total) by mouth daily., Disp: , Rfl:    gabapentin (NEURONTIN) 300 MG capsule, TAKE (1) CAPSULE BY MOUTH TWICE DAILY., Disp: , Rfl:    hydrOXYzine (ATARAX) 50 MG tablet, Take 25-50 mg by mouth 3 (three) times daily as needed., Disp: , Rfl:    ivabradine (CORLANOR) 5 MG TABS tablet, Take 1 tablet (5 mg total) by mouth 2 (two) times daily  with a meal., Disp: 60 tablet, Rfl: 0   levETIRAcetam (KEPPRA) 750 MG tablet, Take 750 mg by mouth 2 (two) times daily., Disp: , Rfl:    loperamide (IMODIUM) 2 MG capsule, Take 2 capsules (4 mg total) by mouth 4 (four) times daily., Disp: 30 capsule, Rfl: 0   metoprolol succinate (TOPROL XL) 25 MG 24 hr tablet, Take 1 tablet (25 mg total) by mouth daily., Disp: 90 tablet, Rfl: 3   micafungin (MYCAMINE) 50 MG injection, Inject 150 mg into the vein daily for 15 days. Indication:  fungal endocarditis First Dose: Yes Last Day of Therapy:  03/30/23 Labs - Once weekly:  CBC/D and BMP, Labs - Once weekly: ESR and CRP, Disp: 15 each, Rfl: 0   Multiple Vitamins-Minerals (SUPER THERA VITE M) TABS, Take 1 tablet by mouth daily., Disp: , Rfl:    nitroGLYCERIN (NITROSTAT) 0.4 MG SL tablet, Place 1 tablet (0.4 mg total) under the tongue every 5 (five) minutes as needed for chest pain., Disp: 100 tablet, Rfl: 0   ondansetron (ZOFRAN-ODT) 4 MG disintegrating tablet, Take 4 mg by mouth every 8 (eight) hours as needed for nausea or vomiting., Disp: , Rfl:    pantoprazole (PROTONIX) 40 MG tablet, Take 1 tablet (40 mg total) by mouth 2 (two) times daily., Disp: 60 tablet, Rfl: 2   polycarbophil (FIBERCON) 625 MG tablet, Take 2 tablets (1,250 mg total) by mouth 2 (two) times daily for 15 days., Disp: 60 tablet, Rfl: 0   RESTASIS 0.05 % ophthalmic emulsion, Place 1 drop into both eyes 2 (two) times daily., Disp: , Rfl:    SPIRIVA RESPIMAT 2.5 MCG/ACT AERS, Inhale 1 puff into the lungs daily., Disp: 4 g, Rfl: 3   thiamine (VITAMIN B1) 100 MG tablet, Take 1 tablet (100 mg total) by mouth daily., Disp: 30 tablet, Rfl: 0   triazolam (HALCION) 0.25 MG tablet, Take 0.25 mg by mouth daily., Disp: , Rfl:    vancomycin (VANCOCIN) 125 MG capsule, Take 1 capsule (125 mg total) by mouth every other day for 8 days, THEN 1 capsule (125 mg total) every 3 (three) days for 9 days., Disp: 7 capsule, Rfl: 0   Vitamin D, Ergocalciferol,  (DRISDOL) 1.25 MG (50000 UNIT) CAPS capsule, Take 50,000 Units by mouth once a week. Takes on Wednesdays., Disp: , Rfl:     Review of Systems     Objective:   Physical Exam        Assessment & Plan:

## 2023-03-28 NOTE — ED Provider Notes (Cosign Needed Addendum)
[place] EMERGENCY DEPARTMENT AT [facility] Provider Note   CSN: [id number] Arrival date & time: 03/28/23  1951     History  No chief complaint on file.   [name] is a 52 y.o. female.  He has PMH of chron's disease, COPD, recent have ex lap for infection and sepsis and had to have an ostomy.  She states has been leaking today, she has tried 2 different bags and is not able to get the stick even with extra tape and has ruined all of her close and is asking for help with changing it.  Denies abdominal pain, bleeding from the stoma, reports good stoma output.  HPI     Home Medications Prior to Admission medications   Medication Sig Start Date End Date Taking? Authorizing Provider  acetaminophen (TYLENOL) 325 MG tablet Take 2 tablets (650 mg total) by mouth every 6 (six) hours as needed for mild pain or headache (or Fever >/= 101). 01/03/22   Vassie Loll, MD  ALPRAZolam Prudy Feeler) 1 MG tablet Take 1 mg by mouth 3 (three) times daily as needed for anxiety. 07/11/19   [provider]  amitriptyline (ELAVIL) 100 MG tablet Take 100 mg by mouth at bedtime.    [provider]  apixaban (ELIQUIS) 5 MG TABS tablet Take 1 tablet (5 mg total) by mouth 2 (two) times daily. 03/19/23 04/18/23  Lanae Boast, MD  bismuth subsalicylate (PEPTO BISMOL) 262 MG/15ML suspension Take 30 mLs by mouth every 4 (four) hours as needed for diarrhea or loose stools. 03/19/23   Lanae Boast, MD  cetirizine (ZYRTEC) 10 MG tablet Take 10 mg by mouth daily. 01/08/22   [provider]  clotrimazole-betamethasone (LOTRISONE) cream SMARTSIG:Sparingly Topical Twice Daily PRN    [provider]  cyclobenzaprine (FLEXERIL) 10 MG tablet Take 10 mg by mouth 3 (three) times daily as needed.    [provider]  diphenoxylate-atropine (LOMOTIL) 2.5-0.025 MG tablet Take 1 tablet by mouth 4 (four) times daily. 03/19/23   Lanae Boast, MD  DULoxetine (CYMBALTA) 30 MG capsule Take 30  mg by mouth daily. 12/17/21   [provider]  feeding supplement (ENSURE ENLIVE / ENSURE PLUS) LIQD Take 237 mLs by mouth 2 (two) times daily between meals. 01/04/22   Vassie Loll, MD  folic acid (FOLVITE) 1 MG tablet Take 1 tablet (1 mg total) by mouth daily. 10/03/21   Catarina Hartshorn, MD  gabapentin (NEURONTIN) 300 MG capsule TAKE (1) CAPSULE BY MOUTH TWICE DAILY. 10/15/22   [provider]  hydrOXYzine (ATARAX) 50 MG tablet Take 25-50 mg by mouth 3 (three) times daily as needed.    [provider]  ivabradine (CORLANOR) 5 MG TABS tablet Take 1 tablet (5 mg total) by mouth 2 (two) times daily with a meal. 03/19/23 04/18/23  Lanae Boast, MD  levETIRAcetam (KEPPRA) 750 MG tablet Take 750 mg by mouth 2 (two) times daily. 09/30/20   [provider]  loperamide (IMODIUM) 2 MG capsule Take 2 capsules (4 mg total) by mouth 4 (four) times daily. 03/19/23   Lanae Boast, MD  metoprolol succinate (TOPROL XL) 25 MG 24 hr tablet Take 1 tablet (25 mg total) by mouth daily. 04/23/22   Jonelle Sidle, MD  micafungin (MYCAMINE) 50 MG injection Inject 150 mg into the vein daily for 15 days. Indication:  fungal endocarditis First Dose: Yes Last Day of Therapy:  03/30/23 Labs - Once weekly:  CBC/D and BMP, Labs - Once weekly: ESR and  CRP 03/18/23 04/02/23  Danelle Earthly, MD  Multiple Vitamins-Minerals (SUPER THERA VITE M) TABS Take 1 tablet by mouth daily. 12/13/21   [provider]  nitroGLYCERIN (NITROSTAT) 0.4 MG SL tablet Place 1 tablet (0.4 mg total) under the tongue every 5 (five) minutes as needed for chest pain. 03/19/21   Rehman, Joline Maxcy, MD  ondansetron (ZOFRAN-ODT) 4 MG disintegrating tablet Take 4 mg by mouth every 8 (eight) hours as needed for nausea or vomiting. 09/07/21   [provider]  pantoprazole (PROTONIX) 40 MG tablet Take 1 tablet (40 mg total) by mouth 2 (two) times daily. 01/03/22   Vassie Loll, MD  polycarbophil (FIBERCON) 625 MG tablet Take  2 tablets (1,250 mg total) by mouth 2 (two) times daily for 15 days. 03/19/23 04/03/23  Lanae Boast, MD  RESTASIS 0.05 % ophthalmic emulsion Place 1 drop into both eyes 2 (two) times daily. 12/12/20   [provider]  SPIRIVA RESPIMAT 2.5 MCG/ACT AERS Inhale 1 puff into the lungs daily. 01/03/22   Vassie Loll, MD  thiamine (VITAMIN B1) 100 MG tablet Take 1 tablet (100 mg total) by mouth daily. 03/20/23 04/19/23  Lanae Boast, MD  triazolam (HALCION) 0.25 MG tablet Take 0.25 mg by mouth daily.    [provider]  vancomycin (VANCOCIN) 125 MG capsule Take 1 capsule (125 mg total) by mouth every other day for 8 days, THEN 1 capsule (125 mg total) every 3 (three) days for 9 days. 03/20/23 04/06/23  Lanae Boast, MD  Vitamin D, Ergocalciferol, (DRISDOL) 1.25 MG (50000 UNIT) CAPS capsule Take 50,000 Units by mouth once a week. Takes on Wednesdays. 12/14/21   [provider]      Allergies    Codeine, Hydrocodone, Ketorolac, Morphine and codeine, Penicillins, Nickel, Pregabalin, Acetaminophen, Atarax [hydroxyzine], Ativan [lorazepam], Strawberry extract, Watermelon concentrate, Albuterol, Benadryl [diphenhydramine hcl], Humira [adalimumab], Latex, Remicade [infliximab], and Tramadol    Review of Systems   Review of Systems  Physical Exam Updated Vital Signs BP 110/65 (BP Location: Right Arm)   Pulse (!) 119   Temp (!) 97.3 F (36.3 C) (Oral)   Resp 17   Ht 5\' 3"  (1.6 m)   Wt 59 kg   SpO2 100%   BMI 23.03 kg/m  Physical Exam Vitals and nursing note reviewed.  Constitutional:      General: She is not in acute distress.    Appearance: She is well-developed.  HENT:     Head: Normocephalic and atraumatic.  Eyes:     Conjunctiva/sclera: Conjunctivae normal.  Cardiovascular:     Rate and Rhythm: Normal rate and regular rhythm.     Heart sounds: No murmur heard. Pulmonary:     Effort: Pulmonary effort is normal. No respiratory distress.     Breath sounds: Normal breath  sounds.  Abdominal:     Palpations: Abdomen is soft.     Tenderness: There is no abdominal tenderness.     Comments: Stoma noted right abdomen, leaking around the bag of stool  Musculoskeletal:        General: No swelling.     Cervical back: Neck supple.  Skin:    General: Skin is warm and dry.     Capillary Refill: Capillary refill takes less than 2 seconds.  Neurological:     General: No focal deficit present.     Mental Status: She is alert and oriented to person, place, and time.  Psychiatric:        Mood and Affect: Mood  normal.     ED Results / Procedures / Treatments   Labs (all labs ordered are listed, but only abnormal results are displayed) Labs Reviewed - No data to display  EKG None  Radiology No results found.  Procedures Procedures    Medications Ordered in ED Medications - No data to display  ED Course/ Medical Decision Making/ A&P                                 Medical Decision Making  Differential diagnosis includes but not limited to colostomy care, infection, other ED course: Patient having a leaking ostomy bag, she is not in pain, no nausea or vomiting, good ostomy output.  No signs of obstruction or infection.  Bag was changed by RN and patient was given clean scrubs and instructed on how to change the bag appropriate at home, she had recent visit for similar complaint.  Advised to follow-up with her surgeon. On vital review patient was tachycardic, not tachycardic grossly on my exam, plan to repeat vitals formally to ensure heart rate was within normal limits but patient anxious to go and walked out after having been told her colostomy was changed.  She is not hypotensive, not febrile not having tenderness, guarding may have been due to anxiety or may not have been an accurate reading.       Final Clinical Impression(s) / ED Diagnoses Final diagnoses:  Colostomy care [facility])    Rx / DC Orders ED Discharge Orders     None          Ma Rings, PA-C 03/28/23 2154    Josem Kaufmann 03/28/23 2157    Lonell Grandchild, MD 03/29/23 1515

## 2023-03-28 NOTE — ED Triage Notes (Signed)
Pt states she is unable to get her colostomy bag to lock in place and it is leaking on her. Pt requesting a new colostomy bag.

## 2023-03-29 ENCOUNTER — Other Ambulatory Visit (HOSPITAL_COMMUNITY): Payer: Self-pay

## 2023-03-29 ENCOUNTER — Telehealth: Payer: Self-pay

## 2023-03-29 ENCOUNTER — Telehealth: Payer: Self-pay | Admitting: Infectious Disease

## 2023-03-29 ENCOUNTER — Inpatient Hospital Stay: Payer: Medicaid Other | Admitting: Infectious Disease

## 2023-03-29 DIAGNOSIS — K529 Noninfective gastroenteritis and colitis, unspecified: Secondary | ICD-10-CM

## 2023-03-29 DIAGNOSIS — I330 Acute and subacute infective endocarditis: Secondary | ICD-10-CM

## 2023-03-29 NOTE — Telephone Encounter (Signed)
     Diagnosis: Fungal endocarditis  Culture Result: C. glabrata  Allergies  Allergen Reactions   Codeine Shortness Of Breath, Swelling and Rash    Throat swelling   Hydrocodone Shortness Of Breath, Swelling and Rash   Ketorolac Nausea And Vomiting and Swelling    Pt reports n/v and swelling to throat   Morphine And Codeine Shortness Of Breath, Swelling and Rash   Penicillins Shortness Of Breath, Swelling and Other (See Comments)    Throat swells 02/06/21--TOLERATES CEFTRIAXONE     Nickel Rash   Pregabalin Other (See Comments)   Acetaminophen     Per MD patient states she can't take Tylenol because of liver enzymes   Atarax [Hydroxyzine] Itching and Swelling    Mildly swollen throat   Ativan [Lorazepam] Other (See Comments)    Migraines.   Strawberry Extract Swelling   Watermelon Concentrate Swelling   Albuterol Palpitations   Benadryl [Diphenhydramine Hcl] Palpitations   Humira [Adalimumab] Rash   Latex Rash   Remicade [Infliximab] Rash    Blisters and Welts    Tramadol Rash    Rash and itching    OPAT Orders Discharge antibiotics to be given via PICC line Discharge antibiotics: Micafungin 150mg  IV daily   Duration: 2 additional weeks  End Date:04/14/2023  [facility] Per Protocol:  Home health RN for IV administration and teaching; PICC line care and labs.    Labs weekly while on IV antibiotics: _x_ CBC with differential __ BMP x__ CMP __ CRP __ ESR __ Vancomycin trough __ CK  __ Please pull PIC at completion of IV antibiotics _x_ Please leave PIC in place until doctor has seen patient or been notified  Fax weekly labs to [phone]  Clinic Follow Up Appt:

## 2023-03-29 NOTE — Telephone Encounter (Signed)
[facility] Patient Advocate Encounter   Received notification from [facility] [place] that prior authorization for Shelle Iron is required.   PA submitted on 03/29/23 Key [id number] Status is pending    [facility] Clinic will continue to follow.   Clearance Coots, CPhT Specialty Pharmacy Patient [facility] Phone: [phone] Fax:  [phone]

## 2023-03-29 NOTE — Telephone Encounter (Signed)
Thanks

## 2023-03-29 NOTE — Telephone Encounter (Addendum)
Called patient to reschedule missed appointment and notify her of Dr. Daiva Eves extending her IV abx till 12/12. Patient is aware of extension and reschedule apt for 12/9@ 10:45 with Dr. Daiva Eves.   Sent a message to [facility] chandler RN at [facility] about extension of IV abx till 12/12.

## 2023-03-29 NOTE — Telephone Encounter (Signed)
[facility] Patient Advocate Encounter  Prior Authorization for Shelle Iron has been approved.    PA# [id number] Effective dates: 03/29/23 through 03/28/24  Patients co-pay is $4.00.   [facility] will continue to follow.  Clearance Coots, CPhT Specialty Pharmacy Patient [facility] Phone: [phone] Fax:  [phone]

## 2023-04-02 ENCOUNTER — Other Ambulatory Visit (HOSPITAL_COMMUNITY): Payer: Self-pay

## 2023-04-03 ENCOUNTER — Emergency Department (HOSPITAL_COMMUNITY): Payer: Medicaid Other

## 2023-04-03 ENCOUNTER — Inpatient Hospital Stay (HOSPITAL_COMMUNITY)
Admission: EM | Admit: 2023-04-03 | Discharge: 2023-04-10 | DRG: 393 | Disposition: A | Discharge status: Home / Self Care | Payer: Medicaid Other | Attending: Family Medicine | Admitting: Family Medicine

## 2023-04-03 ENCOUNTER — Other Ambulatory Visit: Payer: Self-pay

## 2023-04-03 ENCOUNTER — Encounter (HOSPITAL_COMMUNITY): Payer: Self-pay

## 2023-04-03 DIAGNOSIS — Z91018 Allergy to other foods: Secondary | ICD-10-CM

## 2023-04-03 DIAGNOSIS — Z79899 Other long term (current) drug therapy: Secondary | ICD-10-CM

## 2023-04-03 DIAGNOSIS — F419 Anxiety disorder, unspecified: Secondary | ICD-10-CM | POA: Diagnosis present

## 2023-04-03 DIAGNOSIS — Z888 Allergy status to other drugs, medicaments and biological substances status: Secondary | ICD-10-CM

## 2023-04-03 DIAGNOSIS — G8929 Other chronic pain: Secondary | ICD-10-CM | POA: Diagnosis present

## 2023-04-03 DIAGNOSIS — B377 Candidal sepsis: Secondary | ICD-10-CM | POA: Diagnosis present

## 2023-04-03 DIAGNOSIS — E869 Volume depletion, unspecified: Secondary | ICD-10-CM | POA: Diagnosis present

## 2023-04-03 DIAGNOSIS — E871 Hypo-osmolality and hyponatremia: Secondary | ICD-10-CM | POA: Diagnosis present

## 2023-04-03 DIAGNOSIS — Z8249 Family history of ischemic heart disease and other diseases of the circulatory system: Secondary | ICD-10-CM

## 2023-04-03 DIAGNOSIS — R6521 Severe sepsis with septic shock: Secondary | ICD-10-CM | POA: Diagnosis present

## 2023-04-03 DIAGNOSIS — Z833 Family history of diabetes mellitus: Secondary | ICD-10-CM

## 2023-04-03 DIAGNOSIS — J90 Pleural effusion, not elsewhere classified: Secondary | ICD-10-CM | POA: Diagnosis present

## 2023-04-03 DIAGNOSIS — D62 Acute posthemorrhagic anemia: Secondary | ICD-10-CM | POA: Diagnosis present

## 2023-04-03 DIAGNOSIS — G40909 Epilepsy, unspecified, not intractable, without status epilepticus: Secondary | ICD-10-CM | POA: Diagnosis present

## 2023-04-03 DIAGNOSIS — N179 Acute kidney failure, unspecified: Secondary | ICD-10-CM | POA: Diagnosis present

## 2023-04-03 DIAGNOSIS — K219 Gastro-esophageal reflux disease without esophagitis: Secondary | ICD-10-CM | POA: Diagnosis present

## 2023-04-03 DIAGNOSIS — Z8674 Personal history of sudden cardiac arrest: Secondary | ICD-10-CM

## 2023-04-03 DIAGNOSIS — I10 Essential (primary) hypertension: Secondary | ICD-10-CM | POA: Diagnosis present

## 2023-04-03 DIAGNOSIS — L03311 Cellulitis of abdominal wall: Secondary | ICD-10-CM | POA: Diagnosis present

## 2023-04-03 DIAGNOSIS — K6289 Other specified diseases of anus and rectum: Secondary | ICD-10-CM | POA: Diagnosis not present

## 2023-04-03 DIAGNOSIS — Z91040 Latex allergy status: Secondary | ICD-10-CM

## 2023-04-03 DIAGNOSIS — K9402 Colostomy infection: Secondary | ICD-10-CM | POA: Diagnosis present

## 2023-04-03 DIAGNOSIS — E876 Hypokalemia: Secondary | ICD-10-CM | POA: Diagnosis present

## 2023-04-03 DIAGNOSIS — A419 Sepsis, unspecified organism: Principal | ICD-10-CM | POA: Diagnosis present

## 2023-04-03 DIAGNOSIS — K626 Ulcer of anus and rectum: Secondary | ICD-10-CM | POA: Diagnosis not present

## 2023-04-03 DIAGNOSIS — J4489 Other specified chronic obstructive pulmonary disease: Secondary | ICD-10-CM | POA: Diagnosis present

## 2023-04-03 DIAGNOSIS — K5010 Crohn's disease of large intestine without complications: Secondary | ICD-10-CM | POA: Diagnosis present

## 2023-04-03 DIAGNOSIS — Z8619 Personal history of other infectious and parasitic diseases: Secondary | ICD-10-CM

## 2023-04-03 DIAGNOSIS — Z880 Allergy status to penicillin: Secondary | ICD-10-CM

## 2023-04-03 DIAGNOSIS — K624 Stenosis of anus and rectum: Secondary | ICD-10-CM | POA: Diagnosis present

## 2023-04-03 DIAGNOSIS — Z86718 Personal history of other venous thrombosis and embolism: Secondary | ICD-10-CM

## 2023-04-03 DIAGNOSIS — Z7901 Long term (current) use of anticoagulants: Secondary | ICD-10-CM

## 2023-04-03 DIAGNOSIS — E1152 Type 2 diabetes mellitus with diabetic peripheral angiopathy with gangrene: Secondary | ICD-10-CM | POA: Diagnosis present

## 2023-04-03 DIAGNOSIS — Z86711 Personal history of pulmonary embolism: Secondary | ICD-10-CM

## 2023-04-03 DIAGNOSIS — K625 Hemorrhage of anus and rectum: Secondary | ICD-10-CM | POA: Diagnosis not present

## 2023-04-03 DIAGNOSIS — Z7951 Long term (current) use of inhaled steroids: Secondary | ICD-10-CM

## 2023-04-03 DIAGNOSIS — E872 Acidosis, unspecified: Secondary | ICD-10-CM | POA: Diagnosis present

## 2023-04-03 DIAGNOSIS — F4310 Post-traumatic stress disorder, unspecified: Secondary | ICD-10-CM | POA: Diagnosis present

## 2023-04-03 DIAGNOSIS — F17210 Nicotine dependence, cigarettes, uncomplicated: Secondary | ICD-10-CM | POA: Diagnosis present

## 2023-04-03 DIAGNOSIS — K5730 Diverticulosis of large intestine without perforation or abscess without bleeding: Secondary | ICD-10-CM | POA: Diagnosis not present

## 2023-04-03 DIAGNOSIS — B49 Unspecified mycosis: Secondary | ICD-10-CM

## 2023-04-03 DIAGNOSIS — D75839 Thrombocytosis, unspecified: Secondary | ICD-10-CM | POA: Diagnosis present

## 2023-04-03 DIAGNOSIS — Z90710 Acquired absence of both cervix and uterus: Secondary | ICD-10-CM

## 2023-04-03 DIAGNOSIS — Z885 Allergy status to narcotic agent status: Secondary | ICD-10-CM

## 2023-04-03 LAB — CBC WITH DIFFERENTIAL/PLATELET
Abs Immature Granulocytes: 0.07 10*3/uL (ref 0.00–0.07)
Basophils Absolute: 0.1 10*3/uL (ref 0.0–0.1)
Basophils Relative: 1 %
Eosinophils Absolute: 0.5 10*3/uL (ref 0.0–0.5)
Eosinophils Relative: 4 %
HCT: 39 % (ref 36.0–46.0)
Hemoglobin: 13.3 g/dL (ref 12.0–15.0)
Immature Granulocytes: 1 %
Lymphocytes Relative: 20 %
Lymphs Abs: 2.5 10*3/uL (ref 0.7–4.0)
MCH: 28 pg (ref 26.0–34.0)
MCHC: 34.1 g/dL (ref 30.0–36.0)
MCV: 82.1 fL (ref 80.0–100.0)
Monocytes Absolute: 0.5 10*3/uL (ref 0.1–1.0)
Monocytes Relative: 4 %
Neutro Abs: 8.7 10*3/uL — ABNORMAL HIGH (ref 1.7–7.7)
Neutrophils Relative %: 70 %
Platelets: 834 10*3/uL — ABNORMAL HIGH (ref 150–400)
RBC: 4.75 MIL/uL (ref 3.87–5.11)
RDW: 14.6 % (ref 11.5–15.5)
WBC: 12.2 10*3/uL — ABNORMAL HIGH (ref 4.0–10.5)
nRBC: 0 % (ref 0.0–0.2)

## 2023-04-03 LAB — COMPREHENSIVE METABOLIC PANEL
ALT: 35 U/L (ref 0–44)
AST: 47 U/L — ABNORMAL HIGH (ref 15–41)
Albumin: 3.8 g/dL (ref 3.5–5.0)
Alkaline Phosphatase: 212 U/L — ABNORMAL HIGH (ref 38–126)
Anion gap: 18 — ABNORMAL HIGH (ref 5–15)
BUN: 34 mg/dL — ABNORMAL HIGH (ref 6–20)
CO2: 26 mmol/L (ref 22–32)
Calcium: 8.9 mg/dL (ref 8.9–10.3)
Chloride: 79 mmol/L — ABNORMAL LOW (ref 98–111)
Creatinine, Ser: 2.79 mg/dL — ABNORMAL HIGH (ref 0.44–1.00)
GFR, Estimated: 20 mL/min — ABNORMAL LOW (ref >60)
Glucose, Bld: 104 mg/dL — ABNORMAL HIGH (ref 70–99)
Potassium: 3.9 mmol/L (ref 3.5–5.1)
Sodium: 123 mmol/L — ABNORMAL LOW (ref 135–145)
Total Bilirubin: 0.5 mg/dL (ref <1.2)
Total Protein: 8.8 g/dL — ABNORMAL HIGH (ref 6.5–8.1)

## 2023-04-03 LAB — URINALYSIS, ROUTINE W REFLEX MICROSCOPIC
Bacteria, UA: NONE SEEN
Bilirubin Urine: NEGATIVE
Glucose, UA: NEGATIVE mg/dL
Hgb urine dipstick: NEGATIVE
Ketones, ur: NEGATIVE mg/dL
Nitrite: NEGATIVE
Protein, ur: NEGATIVE mg/dL
Specific Gravity, Urine: 1.012 (ref 1.005–1.030)
pH: 5 (ref 5.0–8.0)

## 2023-04-03 LAB — LACTIC ACID, PLASMA
Lactic Acid, Venous: 2.4 mmol/L (ref 0.5–1.9)
Lactic Acid, Venous: 2.2 mmol/L (ref 0.5–1.9)
Lactic Acid, Venous: 2 mmol/L (ref 0.5–1.9)
Lactic Acid, Venous: 2.3 mmol/L (ref 0.5–1.9)

## 2023-04-03 LAB — PROTIME-INR
INR: 1.5 — ABNORMAL HIGH (ref 0.8–1.2)
Prothrombin Time: 18.1 s — ABNORMAL HIGH (ref 11.4–15.2)

## 2023-04-03 LAB — APTT: aPTT: 62 s — ABNORMAL HIGH (ref 24–36)

## 2023-04-03 LAB — MRSA NEXT GEN BY PCR, NASAL: MRSA by PCR Next Gen: NOT DETECTED

## 2023-04-03 LAB — C-REACTIVE PROTEIN: CRP: 1.5 mg/dL — ABNORMAL HIGH (ref <1.0)

## 2023-04-03 LAB — SEDIMENTATION RATE: Sed Rate: 67 mm/h — ABNORMAL HIGH (ref 0–22)

## 2023-04-03 MED ORDER — DULOXETINE HCL 30 MG PO CPEP: 30.0000 mg | ORAL_CAPSULE | Freq: Every day | ORAL | Status: DC

## 2023-04-03 MED ORDER — METRONIDAZOLE 500 MG/100ML IV SOLN: 500.0000 mg | Freq: Two times a day (BID) | INTRAVENOUS | Status: DC

## 2023-04-03 MED ORDER — LACTATED RINGERS IV BOLUS
1000.0000 mL | Freq: Once | INTRAVENOUS | Status: AC
  Administered 2023-04-03: 1000 mL via INTRAVENOUS

## 2023-04-03 MED ORDER — ACETAMINOPHEN 325 MG PO TABS
650.0000 mg | ORAL_TABLET | Freq: Four times a day (QID) | ORAL | Status: DC | PRN
  Administered 2023-04-03 – 2023-04-07 (×7): 650 mg via ORAL
  Filled 2023-04-03 (×8): qty 2

## 2023-04-03 MED ORDER — ACETAMINOPHEN 650 MG RE SUPP: 650.0000 mg | Freq: Four times a day (QID) | RECTAL | Status: DC | PRN

## 2023-04-03 MED ORDER — METRONIDAZOLE 500 MG/100ML IV SOLN
500.0000 mg | Freq: Once | INTRAVENOUS | Status: AC
  Administered 2023-04-03: 500 mg via INTRAVENOUS
  Filled 2023-04-03: qty 100

## 2023-04-03 MED ORDER — ORAL CARE MOUTH RINSE: 15.0000 mL | OROMUCOSAL | Status: DC | PRN

## 2023-04-03 MED ORDER — LACTATED RINGERS IV BOLUS (SEPSIS)
1000.0000 mL | Freq: Once | INTRAVENOUS | Status: AC
Start: 2023-04-03 — End: 2023-04-03
  Administered 2023-04-03: 1000 mL via INTRAVENOUS

## 2023-04-03 MED ORDER — LEVETIRACETAM 500 MG PO TABS
750.0000 mg | ORAL_TABLET | Freq: Two times a day (BID) | ORAL | Status: DC
  Administered 2023-04-03 – 2023-04-10 (×14): 750 mg via ORAL
  Filled 2023-04-03 (×18): qty 1

## 2023-04-03 MED ORDER — AMITRIPTYLINE HCL 25 MG PO TABS
100.0000 mg | ORAL_TABLET | Freq: Every day | ORAL | Status: DC
  Administered 2023-04-04 – 2023-04-09 (×7): 100 mg via ORAL
  Filled 2023-04-03 (×7): qty 4

## 2023-04-03 MED ORDER — LACTATED RINGERS IV BOLUS (SEPSIS)
1000.0000 mL | Freq: Once | INTRAVENOUS | Status: DC
Start: 2023-04-03 — End: 2023-04-03

## 2023-04-03 MED ORDER — SODIUM CHLORIDE 0.9 % IV SOLN
150.0000 mg | INTRAVENOUS | Status: DC
  Administered 2023-04-04 – 2023-04-10 (×7): 150 mg via INTRAVENOUS
  Filled 2023-04-03 (×9): qty 7.5

## 2023-04-03 MED ORDER — TIOTROPIUM BROMIDE MONOHYDRATE 18 MCG IN CAPS
18.0000 ug | ORAL_CAPSULE | Freq: Every day | RESPIRATORY_TRACT | Status: DC
  Filled 2023-04-03: qty 5

## 2023-04-03 MED ORDER — LACTATED RINGERS IV SOLN: INTRAVENOUS | Status: DC

## 2023-04-03 MED ORDER — NOREPINEPHRINE 4 MG/250ML-% IV SOLN
2.0000 ug/min | INTRAVENOUS | Status: DC
Start: 2023-04-03 — End: 2023-04-06
  Administered 2023-04-03: 2 ug/min via INTRAVENOUS
  Administered 2023-04-04: 5 ug/min via INTRAVENOUS
  Filled 2023-04-03 (×2): qty 250

## 2023-04-03 MED ORDER — GABAPENTIN 300 MG PO CAPS
300.0000 mg | ORAL_CAPSULE | Freq: Two times a day (BID) | ORAL | Status: DC
  Administered 2023-04-03 – 2023-04-10 (×14): 300 mg via ORAL
  Filled 2023-04-03 (×14): qty 1

## 2023-04-03 MED ORDER — ONDANSETRON HCL 4 MG/2ML IJ SOLN
4.0000 mg | Freq: Once | INTRAMUSCULAR | Status: AC
Start: 2023-04-03 — End: 2023-04-03
  Administered 2023-04-03: 4 mg via INTRAVENOUS
  Filled 2023-04-03: qty 2

## 2023-04-03 MED ORDER — ALTEPLASE 2 MG IJ SOLR
2.0000 mg | INTRAMUSCULAR | Status: AC
  Administered 2023-04-03: 2 mg
  Filled 2023-04-03 (×2): qty 2

## 2023-04-03 MED ORDER — APIXABAN 5 MG PO TABS
5.0000 mg | ORAL_TABLET | Freq: Two times a day (BID) | ORAL | Status: DC
  Administered 2023-04-03 – 2023-04-05 (×4): 5 mg via ORAL
  Filled 2023-04-03 (×4): qty 1

## 2023-04-03 MED ORDER — PANTOPRAZOLE SODIUM 40 MG PO TBEC
40.0000 mg | DELAYED_RELEASE_TABLET | Freq: Every day | ORAL | Status: DC
  Administered 2023-04-04 – 2023-04-10 (×7): 40 mg via ORAL
  Filled 2023-04-03 (×7): qty 1

## 2023-04-03 MED ORDER — FENTANYL 25 MCG/HR TD PT72
1.0000 | MEDICATED_PATCH | TRANSDERMAL | Status: DC
  Administered 2023-04-03 – 2023-04-09 (×3): 1 via TRANSDERMAL
  Filled 2023-04-03 (×4): qty 1

## 2023-04-03 MED ORDER — FENTANYL CITRATE PF 50 MCG/ML IJ SOSY
25.0000 ug | PREFILLED_SYRINGE | Freq: Once | INTRAMUSCULAR | Status: AC
  Administered 2023-04-03: 25 ug via INTRAVENOUS
  Filled 2023-04-03: qty 1

## 2023-04-03 MED ORDER — SODIUM CHLORIDE 0.9 % IV SOLN
2.0000 g | INTRAVENOUS | Status: DC
  Administered 2023-04-04 – 2023-04-05 (×2): 2 g via INTRAVENOUS
  Filled 2023-04-03 (×2): qty 20

## 2023-04-03 MED ORDER — SODIUM CHLORIDE 0.9 % IV SOLN
2.0000 g | Freq: Once | INTRAVENOUS | Status: AC
  Administered 2023-04-03: 2 g via INTRAVENOUS
  Filled 2023-04-03: qty 12.5

## 2023-04-03 MED ORDER — TRIAZOLAM 0.25 MG PO TABS
0.2500 mg | ORAL_TABLET | Freq: Every day | ORAL | Status: DC
  Filled 2023-04-03: qty 1

## 2023-04-03 MED ORDER — ALPRAZOLAM 0.5 MG PO TABS
0.5000 mg | ORAL_TABLET | Freq: Two times a day (BID) | ORAL | Status: DC | PRN
  Administered 2023-04-03: 0.5 mg via ORAL
  Filled 2023-04-03: qty 1

## 2023-04-03 MED ORDER — NOREPINEPHRINE 4 MG/250ML-% IV SOLN: 2.0000 ug/min | INTRAVENOUS | Status: DC

## 2023-04-03 MED ORDER — FLUTICASONE FUROATE-VILANTEROL 100-25 MCG/ACT IN AEPB
1.0000 | INHALATION_SPRAY | Freq: Every day | RESPIRATORY_TRACT | Status: DC
  Administered 2023-04-03 – 2023-04-10 (×7): 1 via RESPIRATORY_TRACT
  Filled 2023-04-03 (×2): qty 28

## 2023-04-03 MED ORDER — SODIUM CHLORIDE 0.9% FLUSH
10.0000 mL | Freq: Two times a day (BID) | INTRAVENOUS | Status: DC
  Administered 2023-04-03 – 2023-04-10 (×15): 10 mL via INTRAVENOUS

## 2023-04-03 MED ORDER — VANCOMYCIN HCL IN DEXTROSE 1-5 GM/200ML-% IV SOLN
1000.0000 mg | Freq: Once | INTRAVENOUS | Status: AC
Start: 2023-04-03 — End: 2023-04-03
  Administered 2023-04-03: 1000 mg via INTRAVENOUS
  Filled 2023-04-03: qty 200

## 2023-04-03 MED ORDER — VANCOMYCIN HCL 500 MG/100ML IV SOLN
500.0000 mg | Freq: Once | INTRAVENOUS | Status: AC
  Administered 2023-04-03: 500 mg via INTRAVENOUS
  Filled 2023-04-03: qty 100

## 2023-04-03 MED ORDER — VANCOMYCIN VARIABLE DOSE PER UNSTABLE RENAL FUNCTION (PHARMACIST DOSING): Status: DC

## 2023-04-03 MED ORDER — SODIUM CHLORIDE 0.9 % IV SOLN: 2.0000 g | INTRAVENOUS | Status: DC

## 2023-04-03 MED ORDER — SODIUM CHLORIDE 0.9 % IV SOLN
250.0000 mL | INTRAVENOUS | Status: AC
  Administered 2023-04-03: 250 mL via INTRAVENOUS

## 2023-04-03 NOTE — Consult Note (Signed)
Pharmacy Antibiotic Note  ASSESSMENT: 52 y.o. female with PMH colostomy is presenting with sepsis. She is currently hypotensive with leukocytosis. CT imaging exhibits improved mesenteric stranding and lung opacities but residual opacity in RLL. Patient is in AKI, as well. Pharmacy has been consulted to manage cefepime and vancomycin dosing.  Patient measurements: Height: 5\' 3"  (160 cm) Weight: 59 kg (130 lb) IBW/kg (Calculated) : 52.4  Vital signs: Temp: 97.6 F (36.4 C) (12/01 1124) Temp Source: Rectal (12/01 1124) BP: 103/59 (12/01 1245) Pulse Rate: 78 (12/01 1232) Recent Labs  Lab 04/03/23 1130  WBC 12.2*  CREATININE 2.79*   Estimated Creatinine Clearance: 19.3 mL/min (A) (by C-G formula based on SCr of 2.79 mg/dL (H)).  Allergies: Allergies  Allergen Reactions   Codeine Shortness Of Breath, Swelling and Rash    Throat swelling   Hydrocodone Shortness Of Breath, Swelling and Rash   Ketorolac Nausea And Vomiting and Swelling    Pt reports n/v and swelling to throat   Morphine And Codeine Shortness Of Breath, Swelling and Rash   Penicillins Shortness Of Breath, Swelling and Other (See Comments)    Throat swells 02/06/21--TOLERATES CEFTRIAXONE     Nickel Rash   Pregabalin Other (See Comments)   Acetaminophen     Per MD patient states she can't take Tylenol because of liver enzymes   Atarax [Hydroxyzine] Itching and Swelling    Mildly swollen throat   Ativan [Lorazepam] Other (See Comments)    Migraines.   Strawberry Extract Swelling   Watermelon Concentrate Swelling   Albuterol Palpitations   Benadryl [Diphenhydramine Hcl] Palpitations   Humira [Adalimumab] Rash   Latex Rash   Remicade [Infliximab] Rash    Blisters and Welts    Tramadol Rash    Rash and itching    Antimicrobials this admission: Cefepime 12/1 >>  Vancomycin 12/1 >>  Dose adjustments this admission: N/A  Microbiology results: 12/1 BCx: sent  PLAN: Initiate cefepime 2 g IV q24H Obtain  24-hour vancomycin level before re-dosing due to patient's AKI Follow up culture results to assess for antibiotic optimization. Monitor renal function to assess for any necessary antibiotic dosing changes.   Thank you for allowing pharmacy to be a part of this patient's care.  Will M. Dareen Piano, PharmD Clinical Pharmacist 04/03/2023 1:24 PM

## 2023-04-03 NOTE — ED Provider Notes (Cosign Needed Addendum)
[place] EMERGENCY DEPARTMENT AT [facility] Provider Note   CSN: [id number] Arrival date & time: 04/03/23  1102     History  No chief complaint on file.   [name] is a 52 y.o. female with recent colectomy with Dr. 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 Glabrata currently on Micafungin, COPD, hypertension, type 2 diabetes, presents with concern for not having any colostomy supply bags at home.  She reports good output from her colostomy site.  Does report slight abdominal pain over the past couple days.  Denies any fever or chills at home.  Also reports one of her PICC line ports will not flush and she has not been able to take her Microfungin the last couple of days.  HPI     Home Medications Prior to Admission medications   Medication Sig Start Date End Date Taking? Authorizing Provider  amitriptyline (ELAVIL) 75 MG tablet Take 1 tablet by mouth at bedtime. 03/21/23  Yes [provider]  metoprolol tartrate (LOPRESSOR) 25 MG tablet Take 25 mg by mouth 2 (two) times daily. 03/19/23  Yes [provider]  acetaminophen (TYLENOL) 325 MG tablet Take 2 tablets (650 mg total) by mouth every 6 (six) hours as needed for mild pain or headache (or Fever >/= 101). 01/03/22   Vassie Loll, MD  ALPRAZolam Prudy Feeler) 1 MG tablet Take 1 mg by mouth 3 (three) times daily as needed for anxiety. 07/11/19   [provider]  amitriptyline (ELAVIL) 100 MG tablet Take 100 mg by mouth at bedtime.    [provider]  apixaban (ELIQUIS) 5 MG TABS tablet Take 1 tablet (5 mg total) by mouth 2 (two) times daily. 03/19/23 04/18/23  Lanae Boast, MD  bismuth subsalicylate (PEPTO BISMOL) 262 MG/15ML suspension Take 30 mLs by mouth every 4 (four) hours as needed for diarrhea or loose stools. 03/19/23   Lanae Boast, MD  cetirizine (ZYRTEC) 10 MG tablet  Take 10 mg by mouth daily. 01/08/22   [provider]  clotrimazole-betamethasone (LOTRISONE) cream SMARTSIG:Sparingly Topical Twice Daily PRN    [provider]  cyclobenzaprine (FLEXERIL) 10 MG tablet Take 10 mg by mouth 3 (three) times daily as needed.    [provider]  diphenoxylate-atropine (LOMOTIL) 2.5-0.025 MG tablet Take 1 tablet by mouth 4 (four) times daily. 03/19/23   Lanae Boast, MD  DULoxetine (CYMBALTA) 30 MG capsule Take 30 mg by mouth daily. 12/17/21   [provider]  feeding supplement (ENSURE ENLIVE / ENSURE PLUS) LIQD Take 237 mLs by mouth 2 (two) times daily between meals. 01/04/22   Vassie Loll, MD  fentaNYL (DURAGESIC) 25 MCG/HR Place 1 patch onto the skin every 3 (three) days. 03/21/23   [provider]  folic acid (FOLVITE) 1 MG tablet Take 1 tablet (1 mg total) by mouth daily. 10/03/21   Catarina Hartshorn, MD  gabapentin (NEURONTIN) 300 MG capsule TAKE (1) CAPSULE BY MOUTH TWICE DAILY. 10/15/22   [provider]  hydrOXYzine (ATARAX) 50 MG tablet Take 25-50 mg by mouth 3 (three) times daily as needed.    [provider]  ivabradine (CORLANOR) 5 MG TABS tablet Take 1 tablet (5 mg total) by mouth 2 (two) times daily with a meal. 03/19/23 04/18/23  Lanae Boast, MD  levETIRAcetam (KEPPRA) 750 MG tablet Take 750 mg by mouth 2 (two) times daily. 09/30/20   [provider]  loperamide (IMODIUM) 2 MG capsule Take 2 capsules (4 mg total) by mouth 4 (four) times daily. 03/19/23   Lanae Boast, MD  metoprolol succinate (TOPROL XL) 25 MG 24 hr tablet Take 1 tablet (25 mg total) by mouth daily. 04/23/22   Jonelle Sidle, MD  micafungin [facility]) 100 MG SOLR injection Inject 50 mg into the vein. 03/30/23   [provider]  Multiple Vitamins-Minerals (SUPER THERA VITE M) TABS Take 1 tablet by mouth daily. 12/13/21   [provider]  nitroGLYCERIN (NITROSTAT) 0.4 MG SL tablet Place 1 tablet (0.4 mg total) under  the tongue every 5 (five) minutes as needed for chest pain. 03/19/21   Rehman, Joline Maxcy, MD  ondansetron (ZOFRAN-ODT) 4 MG disintegrating tablet Take 4 mg by mouth every 8 (eight) hours as needed for nausea or vomiting. 09/07/21   [provider]  pantoprazole (PROTONIX) 40 MG tablet Take 1 tablet (40 mg total) by mouth 2 (two) times daily. 01/03/22   Vassie Loll, MD  polycarbophil (FIBERCON) 625 MG tablet Take 2 tablets (1,250 mg total) by mouth 2 (two) times daily for 15 days. 03/19/23 04/03/23  Lanae Boast, MD  RESTASIS 0.05 % ophthalmic emulsion Place 1 drop into both eyes 2 (two) times daily. 12/12/20   [provider]  SPIRIVA RESPIMAT 2.5 MCG/ACT AERS Inhale 1 puff into the lungs daily. 01/03/22   Vassie Loll, MD  thiamine (VITAMIN B1) 100 MG tablet Take 1 tablet (100 mg total) by mouth daily. 03/20/23 04/19/23  Lanae Boast, MD  triazolam (HALCION) 0.25 MG tablet Take 0.25 mg by mouth daily.    [provider]  vancomycin (VANCOCIN) 125 MG capsule Take 1 capsule (125 mg total) by mouth every other day for 8 days, THEN 1 capsule (125 mg total) every 3 (three) days for 9 days. 03/20/23 04/06/23  Lanae Boast, MD  Vitamin D, Ergocalciferol, (DRISDOL) 1.25 MG (50000 UNIT) CAPS capsule Take 50,000 Units by mouth once a week. Takes on Wednesdays. 12/14/21   [provider]      Allergies    Codeine, Hydrocodone, Ketorolac, Morphine and codeine, Penicillins, Nickel, Pregabalin, Acetaminophen, Atarax [hydroxyzine], Ativan [lorazepam], Oxycodone-acetaminophen, Strawberry extract, Watermelon concentrate, Albuterol, Benadryl [diphenhydramine hcl], Humira [adalimumab], Latex, Remicade [infliximab], and Tramadol    Review of Systems   Review of Systems  Constitutional:  Negative for fever.  Skin:        Colostomy concern    Physical Exam Updated Vital Signs BP 101/61   Pulse 81   Temp (!) 97 F (36.1 C) (Oral)   Resp (!) 23   Ht 5\' 3"  (1.6 m)   Wt 59 kg   SpO2  100%   BMI 23.03 kg/m  Physical Exam Vitals and nursing note reviewed.  Constitutional:      General: She is not in acute distress.    Appearance: She is well-developed.  HENT:     Head: Normocephalic and atraumatic.  Eyes:     Conjunctiva/sclera: Conjunctivae normal.  Cardiovascular:     Rate and Rhythm: Normal rate and regular rhythm.     Heart sounds: No murmur heard. Pulmonary:     Effort: Pulmonary effort is normal. No respiratory distress.     Breath sounds: Normal breath sounds.  Abdominal:     Palpations: Abdomen is soft.     Tenderness: There is abdominal tenderness.     Comments: Stoma site with yellow watery drainage, surrounding skin erythematous and irritated appearing  Abdomen mildly  tender to palpation all quadrants  Musculoskeletal:        General: No swelling.     Cervical back: Neck supple.  Skin:    General: Skin is warm and dry.     Capillary Refill: Capillary refill takes less than 2 seconds.  Neurological:     Mental Status: She is alert.  Psychiatric:        Mood and Affect: Mood normal.       ED Results / Procedures / Treatments   Labs (all labs ordered are listed, but only abnormal results are displayed) Labs Reviewed  LACTIC ACID, PLASMA - Abnormal; Notable for the following components:      Result Value   Lactic Acid, Venous 2.4 (*)    All other components within normal limits  LACTIC ACID, PLASMA - Abnormal; Notable for the following components:   Lactic Acid, Venous 2.2 (*)    All other components within normal limits  COMPREHENSIVE METABOLIC PANEL - Abnormal; Notable for the following components:   Sodium 123 (*)    Chloride 79 (*)    Glucose, Bld 104 (*)    BUN 34 (*)    Creatinine, Ser 2.79 (*)    Total Protein 8.8 (*)    AST 47 (*)    Alkaline Phosphatase 212 (*)    GFR, Estimated 20 (*)    Anion gap 18 (*)    All other components within normal limits  CBC WITH DIFFERENTIAL/PLATELET - Abnormal; Notable for the following  components:   WBC 12.2 (*)    Platelets 834 (*)    Neutro Abs 8.7 (*)    All other components within normal limits  PROTIME-INR - Abnormal; Notable for the following components:   Prothrombin Time 18.1 (*)    INR 1.5 (*)    All other components within normal limits  APTT - Abnormal; Notable for the following components:   aPTT 62 (*)    All other components within normal limits  SEDIMENTATION RATE - Abnormal; Notable for the following components:   Sed Rate 67 (*)    All other components within normal limits  URINALYSIS, ROUTINE W REFLEX MICROSCOPIC - Abnormal; Notable for the following components:   APPearance HAZY (*)    Leukocytes,Ua TRACE (*)    All other components within normal limits  CULTURE, BLOOD (ROUTINE X 2)  CULTURE, BLOOD (ROUTINE X 2)  MRSA NEXT GEN BY PCR, NASAL  C-REACTIVE PROTEIN    EKG None  Radiology CT ABDOMEN PELVIS WO CONTRAST  Result Date: 04/03/2023 CLINICAL DATA:  Abdominal pain.  Hypotension. EXAM: CT ABDOMEN AND PELVIS WITHOUT CONTRAST TECHNIQUE: Multidetector CT imaging of the abdomen and pelvis was performed following the standard protocol without IV contrast. RADIATION DOSE REDUCTION: This exam was performed according to the departmental dose-optimization program which includes automated exposure control, adjustment of the mA and/or kV according to patient size and/or use of iterative reconstruction technique. COMPARISON:  CT 02/25/2023. FINDINGS: Lower chest: Improved left pleural effusion compared to previous. Improving left lung opacity. There is some mild opacity residual in the lingula dependently. There is also some opacity along the anterior aspect of the right lower lobe. A focal infiltrates possible. Recommend follow-up. Breathing motion. Heart is slightly enlarged. Trace pericardial fluid. Hepatobiliary: Heterogeneous liver with some fatty infiltration. Previous cholecystectomy. Pancreas: Unremarkable. No pancreatic ductal dilatation or  surrounding inflammatory changes. Spleen: Normal in size without focal abnormality. Adrenals/Urinary Tract: Stable slight thickening of the left adrenal gland. Right adrenal gland is  preserved. No abnormal calcifications are seen within either kidney nor along the course of either ureter. Preserved contours of the distended urinary bladder there is persistent wall thickening of the bladder particularly superiorly as seen on coronal series 5, image 49 please correlate for any known history or further workup when appropriate Stomach/Bowel: Right lower quadrant ileostomy identified. On this non oral contrast exam the stomach and small bowel are nondilated. Large bowel is relatively decompressed as well. The previous drain is no longer identified. Areas of stranding along the mesentery on left side are decreasing with significant residual. No free air or free fluid. Vascular/Lymphatic: Normal caliber aorta and IVC. Minimal vascular calcifications no discrete abnormal lymph node enlargement identified in the abdomen and pelvis. Previous small node aortocaval measuring 8 mm in short axis on the prior, today measures 6 mm on series 2 image 34. Reproductive: Status post hysterectomy. No adnexal masses. Other: Please see above Musculoskeletal: Curvature of the spine. Scattered degenerative changes. IMPRESSION: Decreasing mesenteric stranding, fluid with some residual. Previous drain is also no longer present. Right lower quadrant ostomy. No bowel obstruction.  The bowel overall is relatively decompressed. No obstructing renal stone. Improving pleural effusions and lung opacities with some residual opacity in the right lower lobe and lingula. Please correlate for signs of infiltrative recommend continued follow-up. Electronically Signed   By: Karen Kays M.D.   On: 04/03/2023 13:17   DG Chest Port 1 View  Result Date: 04/03/2023 CLINICAL DATA:  Possible sepsis. EXAM: PORTABLE CHEST 1 VIEW COMPARISON:  February 28, 2023.  FINDINGS: The heart size and mediastinal contours are within normal limits. Left internal jugular catheter is noted with distal tip in expected position of the SVC. Both lungs are clear. The visualized skeletal structures are unremarkable. IMPRESSION: No active disease. Electronically Signed   By: Lupita Raider M.D.   On: 04/03/2023 12:42    Procedures .Critical Care  Performed by: Arabella Merles, PA-C Authorized by: Arabella Merles, PA-C   Critical care provider statement:    Critical care time (minutes):  50   Critical care was necessary to treat or prevent imminent or life-threatening deterioration of the following conditions:  Sepsis and shock   Critical care was time spent personally by me on the following activities:  Development of treatment plan with patient or surrogate, discussions with consultants, evaluation of patient's response to treatment, examination of patient, ordering and review of laboratory studies, ordering and review of radiographic studies, ordering and performing treatments and interventions, pulse oximetry, re-evaluation of patient's condition, review of old charts and obtaining history from patient or surrogate   Care discussed with: admitting provider       Medications Ordered in ED Medications  0.9 %  sodium chloride infusion (250 mLs Intravenous New Bag/Given 04/03/23 1206)  sodium chloride flush (NS) 0.9 % injection 10 mL (10 mLs Intravenous Given 04/03/23 1542)  vancomycin (VANCOREADY) IVPB 500 mg/100 mL (has no administration in time range)  ceFEPIme (MAXIPIME) 2 g in sodium chloride 0.9 % 100 mL IVPB (has no administration in time range)  vancomycin variable dose per unstable renal function (pharmacist dosing) (has no administration in time range)  norepinephrine (LEVOPHED) 4mg  in (0.016 mg/mL) premix infusion (has no administration in time range)  alteplase (CATHFLO ACTIVASE) injection 2 mg (2 mg Intracatheter Given 04/03/23 1539)  lactated  ringers bolus 1,000 mL (0 mLs Intravenous Stopped 04/03/23 1238)    And  lactated ringers bolus 1,000 mL (1,000 mLs Intravenous New Bag/Given 04/03/23  1231)  ceFEPIme (MAXIPIME) 2 g in sodium chloride 0.9 % 100 mL IVPB (2 g Intravenous New Bag/Given 04/03/23 1201)  metroNIDAZOLE (FLAGYL) IVPB 500 mg (500 mg Intravenous New Bag/Given 04/03/23 1211)  vancomycin (VANCOCIN) IVPB 1000 mg/200 mL premix (1,000 mg Intravenous New Bag/Given 04/03/23 1226)  fentaNYL (SUBLIMAZE) injection 25 mcg (25 mcg Intravenous Given 04/03/23 1537)  lactated ringers bolus 1,000 mL (1,000 mLs Intravenous New Bag/Given 04/03/23 1330)    ED Course/ Medical Decision Making/ A&P Clinical Course as of 04/03/23 1601  Sun Apr 03, 2023  1225 Sodium(!): 123 Baseline 128 [RP]  1226 Creatinine(!): 2.79 Baseline 0.6 [RP]    Clinical Course User Index [RP] Rondel Baton, MD                                 Medical Decision Making Amount and/or Complexity of Data Reviewed Labs: ordered. Decision-making details documented in ED Course. Radiology: ordered. ECG/medicine tests: ordered.  Risk Prescription drug management. Decision regarding hospitalization.     Differential diagnosis includes but is not limited to colostomy site concern, colitis, Crohn's flare, bacteremia, sepsis, PICC line concern  ED Course:  Patient sick appearing.  Blood pressure upon arrival at 80/33, given concern for sepsis with likely abdominal source of infection given recent bowel necrosis and C. diff, she started on LR bolus per sepsis order set. Started on IV metronidazole, cefepime, vancomycin.  Upon recheck after liter, blood pressure increasing to 93/56 with about half of the back given.  Will continue to monitor this closely, but for now we will hold on any pressors.  Given leukocytosis of 12.2, lactic acid of 2.4, code sepsis was initiated.   Her stoma site is uncovered.  There is yellow drainage from the stoma site with the surrounding  skin very erythematous and irritated appearing.  PICC line was attempted to be flushed by nursing.  The one port site would not flush, but the other was able to be flushed without difficulty.  It appears the port site may have blood that clotted in it which is causing the blockage. Alteplase was ordered to help flush the clogged site, this was discussed with the pharmacist due to her allergies listed in the system.  Pharmacy felt alteplase was safe for the patient and this was given to unclog the port site. Upon re-evaluation, patient blood pressures improving, currently at 103/59. Will give an additional LR bolus as she is responding to the ones already given. Will hold on norepinephrine per Dr. Eloise Harman. CT abdomen and pelvis obtained without contrast due to Cr of 2.79, up from 0.55 2 weeks ago.  This showed improvement of her pleural effusions, improvement in mesenteric stranding.  No acute abnormalities. Upon reevaluation after third LR bolus, BP 101/61.  No indication for norepinephrine at this time, will continue to closely monitor.  Patient complained of abdominal pain, she was given 25 mg fentanyl.  Suspect that since she missed her micafungin over the last day due to her port site being blocked, this may have precipitated the septic presentation.  Will plan on admission for further blood pressure control and management of sepsis and AKI  Impression: Septic shock Hyponatremia AKI  Disposition:  Admission with Dr. Austin Miles  Lab Tests: I Ordered, and personally interpreted labs.  The pertinent results include:   CBC with leukocytosis of 12.2 CMP with hyponatremia 123, creatinine 2.79 up from 0.55 two weeks ago, elevated AST at 47,  elevated alk phos 212, anion gap 18 Urinalysis unremarkable Lactic acid 2.2  Imaging Studies ordered: I ordered imaging studies including CT abdomen pelvis, chest x-ray I independently visualized the imaging with scope of interpretation limited to determining  acute life threatening conditions related to emergency care.  CT and x-ray imaging showed No acute abnormalities I agree with the radiologist interpretation   Cardiac Monitoring: / EKG: The patient was maintained on a cardiac monitor.  I personally viewed and interpreted the cardiac monitored which showed an underlying rhythm of: Normal sinus rhythm   Consultations Obtained: I requested consultation with the hospitalist Dr. Austin Miles,  and discussed lab and imaging findings as well as pertinent plan - they recommend: admission  External records from outside source obtained and reviewed including Dr. Daiva Eves note from 03/29/2023 where patient was to be continued on micafungin 150 mg IV daily until 04/14/2023 for C glabrata grown in blood culture  Reviewed notes from 02/11/2023 where she was found to have C. difficile colitis, intra-abdominal infection with necrosis and abscess and fungemia.               Final Clinical Impression(s) / ED Diagnoses Final diagnoses:  Septic shock (HCC)  Hyponatremia  AKI (acute kidney injury) [facility])    Rx / DC Orders ED Discharge Orders     None         Arabella Merles, PA-C 04/03/23 1603    Arabella Merles, PA-C 04/03/23 1605    Rondel Baton, MD 04/07/23 1744

## 2023-04-03 NOTE — ED Notes (Signed)
Patient transported to CT 

## 2023-04-03 NOTE — Consult Note (Signed)
CODE SEPSIS - PHARMACY COMMUNICATION  **Broad-spectrum antimicrobials should be administered within one hour of sepsis diagnosis**  Time Code Sepsis call or page was received: 1152  Antibiotics ordered: Cefepime, Vancomycin, Metronidazole  Time of first antibiotic administration: 1201  Additional action taken by pharmacy: N/A  If necessary, name of provider/nurse contacted: N/A    Will M. Dareen Piano, PharmD Clinical Pharmacist 04/03/2023 12:17 PM

## 2023-04-03 NOTE — Plan of Care (Signed)

## 2023-04-03 NOTE — ED Notes (Signed)
Pharmacy to bring alteplase injection to ED.

## 2023-04-03 NOTE — Progress Notes (Signed)
0102 Patient eating, unavailable at this time to administer BREO.

## 2023-04-03 NOTE — ED Notes (Signed)
Spoke with Christine Meyer RN about fentanyl patch not available in pyxis. [name] states pt will get patch when she comes to the floor.

## 2023-04-03 NOTE — Progress Notes (Signed)
Elink following for sepsis protocol. 

## 2023-04-03 NOTE — H&P (Addendum)
History and Physical    Patient: [name] ZOX:[id number] DOB: June 14, 1970 DOA: 04/03/2023 DOS: the patient was seen and examined on 04/03/2023 PCP: [name], PA-C  Patient coming from: Home  Chief Complaint: Abdominal pain  HPI: [name] is a 52 y.o. female with medical history significant of recent colectomy with Dr. 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  She reports that she was initially in her usual state of health but after arrival to the ED she began having generalized abdominal pain.  She states that her stomach pain is located in the lower portion of her stomach and is fairly diffuse.  She does not recall any alleviating or aggravating factors given that abdominal pain began right after coming to the ED.  She does endorse some nausea but has not noted any vomiting at home.  She has been able to eat, last meal yesterday, and was able to keep it down.  She rates her stomach pain as a 10 out of 10 and states that it is currently more painful than her recent surgery.  She denies any vomiting, shortness of breath, chest pain, palpitations, fevers, urinary changes, diarrhea.  She does endorse some subjective chills and generalized weakness.  Of note, patient reports that her blood pressure has always remained in the 90s over 50s range since childhood.  However on chart review it does appear that her recent blood pressures during outpatient visits have maintained in the 110s over 50s-60s.  In the ED, initial blood pressure was 80/33 while other vital signs were fairly  unremarkable.  CBC revealed a leukocytosis of 12.2 and thrombocytosis (chronic).  CMP revealed hyponatremia (chronic), creatinine 2.79 (baseline around 0.7), elevated liver enzymes (chronic). Lactate elevated at 2.4 and 2.2 on repeat.  UA unremarkable.  Blood cultures were drawn x 2.  She received 3 L IV fluid per sepsis protocol given concern for infection and she was started on broad-spectrum antibiotics with vancomycin, cefepime, Flagyl.  Chest x-ray unremarkable.  CT abdomen pelvis without contrast did not show any acute findings and revealed decreasing mesenteric stranding compared to prior.  [facility] hospitalist asked to evaluate patient for admission.  Review of Systems: As mentioned in the history of present illness. All other systems reviewed and are negative. 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   GERD (gastroesophageal reflux disease)    EGD 01/2007 by Dr.Rourke small hiatal hernia s/p 56 french maloney    History of head injury    Hypertension    IBS (irritable bowel syndrome)    PSVT (paroxysmal supraventricular tachycardia) (HCC)    PTSD (post-traumatic stress disorder)    Recurrent chest pain    Seizure disorder (HCC)    Type 2 diabetes mellitus (HCC)    Past Surgical History:  Procedure Laterality Date   ABDOMINAL HYSTERECTOMY     with right salpingo oophorectomy 2005   APPENDECTOMY  2006   BALLOON DILATION N/A 02/19/2021  Procedure: BALLOON DILATION;  Surgeon: Lanelle Bal, DO;  [facility]: [facility];  Service: Endoscopy;  Laterality: N/A;  Sigmoid colon stricture   BIOPSY  02/06/2021   Procedure: BIOPSY;  Surgeon: Lanelle Bal, DO;  [facility]: [facility];  Service: Endoscopy;;   BIOPSY  02/19/2021   Procedure: BIOPSY;  Surgeon: Lanelle Bal, DO;  [facility]: [facility];  Service: Endoscopy;;   BIOPSY  07/15/2021   Procedure: BIOPSY;  Surgeon: Malissa Hippo, MD;  [facility]: [facility];  Service: Endoscopy;;   CESAREAN SECTION     1996   CHOLECYSTECTOMY     COLECTOMY N/A 02/11/2023   Procedure: TOTAL COLECTOMY;  Surgeon: Harriette Bouillon, MD;  Location: [facility];  Service: General;  Laterality: N/A;   COLONOSCOPY  2010   Dr. Jena Gauss; negative except for hemorrhoids   COLOSTOMY  02/11/2023   Procedure: COLOSTOMY;  Surgeon: Harriette Bouillon, MD;  Location: [facility];  Service: General;;   ESOPHAGEAL DILATION N/A 10/25/2014   Procedure: ESOPHAGEAL DILATION;  Surgeon: Malissa Hippo, MD;  [facility];  Service: Endoscopy;  Laterality: N/A;   ESOPHAGOGASTRODUODENOSCOPY N/A 10/25/2014   Procedure: ESOPHAGOGASTRODUODENOSCOPY (EGD);  Surgeon: Malissa Hippo, MD;  [facility]: [facility];  Service: Endoscopy;  Laterality: N/A;  1250   FLEXIBLE SIGMOIDOSCOPY N/A 02/06/2021   Procedure: FLEXIBLE SIGMOIDOSCOPY;  Surgeon: Lanelle Bal, DO;  [facility];  Service: Endoscopy;  Laterality: N/A;   FLEXIBLE SIGMOIDOSCOPY N/A 02/19/2021   Procedure: FLEXIBLE SIGMOIDOSCOPY;  Surgeon: Lanelle Bal, DO;  [facility];  Service: Endoscopy;  Laterality: N/A;   FLEXIBLE SIGMOIDOSCOPY N/A 02/02/2023   Procedure: FLEXIBLE SIGMOIDOSCOPY;  Surgeon: Beverley Fiedler, MD;  Location: [facility] ENDOSCOPY;  Service: Gastroenterology;  Laterality: N/A;   FLEXIBLE SIGMOIDOSCOPY N/A 02/10/2023   Procedure: FLEXIBLE SIGMOIDOSCOPY;  Surgeon: Sherrilyn Rist, MD;  Location: [facility] ENDOSCOPY;  Service: Gastroenterology;  Laterality: N/A;   IR FLUORO GUIDE CV LINE LEFT  02/21/2023   IR US GUIDE VASC ACCESS LEFT  02/21/2023   LAPAROTOMY N/A 02/11/2023   Procedure: EXPLORATORY LAPAROTOMY;  Surgeon: Harriette Bouillon, MD;  Location: [facility];  Service: General;  Laterality: N/A;   OOPHORECTOMY     left for torsion and ovarian fibroma; uterine myoma resected 1995    SIGMOIDOSCOPY  07/15/2021   Procedure: SIGMOIDOSCOPY;  Surgeon: Malissa Hippo, MD;  [facility];  Service: Endoscopy;;   TRANSESOPHAGEAL ECHOCARDIOGRAM (CATH LAB) N/A 02/18/2023   Procedure: TRANSESOPHAGEAL ECHOCARDIOGRAM;  Surgeon: Sande Rives, MD;  Location: [facility] INVASIVE CV LAB;  Service: Cardiovascular;  Laterality: N/A;   Social History:  reports that she has been smoking cigarettes. She started smoking about 38 years ago. She has a 19.3 pack-year smoking history. She has been exposed to tobacco smoke. She has never used smokeless tobacco. She reports current drug use. Drug: Marijuana. She reports that she does not drink alcohol.  Allergies  Allergen Reactions   Codeine Shortness Of Breath, Swelling and Rash    Throat swelling   Hydrocodone Shortness Of Breath, Swelling and Rash   Ketorolac Nausea And Vomiting, Swelling and Nausea Only    Pt reports n/v and swelling to throat   Morphine And Codeine Shortness Of Breath, Swelling and Rash   Penicillins Shortness Of Breath, Swelling and Other (See Comments)    Throat swells 02/06/21--TOLERATES CEFTRIAXONE     Nickel Rash   Pregabalin Other (See Comments)   Acetaminophen  Per MD patient states she can't take Tylenol because of liver enzymes   Atarax [Hydroxyzine] Itching and Swelling    Mildly swollen throat   Ativan [Lorazepam] Other (See Comments)    Migraines.   Oxycodone-Acetaminophen Nausea Only and Nausea And Vomiting   Strawberry Extract Swelling   Watermelon Concentrate Swelling   Albuterol Palpitations   Benadryl [Diphenhydramine Hcl] Palpitations   Humira [Adalimumab] Rash   Latex Rash   Remicade [Infliximab] Rash    Blisters and Welts    Tramadol Rash    Rash and itching    Family History  Problem Relation Age of Onset   Cancer Mother    Heart failure Mother    Hypertension Mother    Heart attack Mother    Heart attack Father 45   Cancer Father 18   Heart failure Father    Diabetes  Father    Crohn's disease Cousin     Prior to Admission medications   Medication Sig Start Date End Date Taking? Authorizing Provider  amitriptyline (ELAVIL) 75 MG tablet Take 1 tablet by mouth at bedtime. 03/21/23  Yes [provider]  metoprolol tartrate (LOPRESSOR) 25 MG tablet Take 25 mg by mouth 2 (two) times daily. 03/19/23  Yes [provider]  acetaminophen (TYLENOL) 325 MG tablet Take 2 tablets (650 mg total) by mouth every 6 (six) hours as needed for mild pain or headache (or Fever >/= 101). 01/03/22   Vassie Loll, MD  ALPRAZolam Prudy Feeler) 1 MG tablet Take 1 mg by mouth 3 (three) times daily as needed for anxiety. 07/11/19   [provider]  amitriptyline (ELAVIL) 100 MG tablet Take 100 mg by mouth at bedtime.    [provider]  apixaban (ELIQUIS) 5 MG TABS tablet Take 1 tablet (5 mg total) by mouth 2 (two) times daily. 03/19/23 04/18/23  Lanae Boast, MD  bismuth subsalicylate (PEPTO BISMOL) 262 MG/15ML suspension Take 30 mLs by mouth every 4 (four) hours as needed for diarrhea or loose stools. 03/19/23   Lanae Boast, MD  cetirizine (ZYRTEC) 10 MG tablet Take 10 mg by mouth daily. 01/08/22   [provider]  clotrimazole-betamethasone (LOTRISONE) cream SMARTSIG:Sparingly Topical Twice Daily PRN    [provider]  cyclobenzaprine (FLEXERIL) 10 MG tablet Take 10 mg by mouth 3 (three) times daily as needed.    [provider]  diphenoxylate-atropine (LOMOTIL) 2.5-0.025 MG tablet Take 1 tablet by mouth 4 (four) times daily. 03/19/23   Lanae Boast, MD  DULoxetine (CYMBALTA) 30 MG capsule Take 30 mg by mouth daily. 12/17/21   [provider]  feeding supplement (ENSURE ENLIVE / ENSURE PLUS) LIQD Take 237 mLs by mouth 2 (two) times daily between meals. 01/04/22   Vassie Loll, MD  fentaNYL (DURAGESIC) 25 MCG/HR Place 1 patch onto the skin every 3 (three) days. 03/21/23   [provider]  folic acid (FOLVITE) 1 MG tablet  Take 1 tablet (1 mg total) by mouth daily. 10/03/21   Catarina Hartshorn, MD  gabapentin (NEURONTIN) 300 MG capsule TAKE (1) CAPSULE BY MOUTH TWICE DAILY. 10/15/22   [provider]  hydrOXYzine (ATARAX) 50 MG tablet Take 25-50 mg by mouth 3 (three) times daily as needed.    [provider]  ivabradine (CORLANOR) 5 MG TABS tablet Take 1 tablet (5 mg total) by mouth 2 (two) times daily with a meal. 03/19/23 04/18/23  Lanae Boast, MD  levETIRAcetam (KEPPRA) 750 MG tablet Take 750 mg by mouth 2 (two) times  daily. 09/30/20   [provider]  loperamide (IMODIUM) 2 MG capsule Take 2 capsules (4 mg total) by mouth 4 (four) times daily. 03/19/23   Lanae Boast, MD  metoprolol succinate (TOPROL XL) 25 MG 24 hr tablet Take 1 tablet (25 mg total) by mouth daily. 04/23/22   Jonelle Sidle, MD  micafungin [facility]) 100 MG SOLR injection Inject 50 mg into the vein. 03/30/23   [provider]  Multiple Vitamins-Minerals (SUPER THERA VITE M) TABS Take 1 tablet by mouth daily. 12/13/21   [provider]  nitroGLYCERIN (NITROSTAT) 0.4 MG SL tablet Place 1 tablet (0.4 mg total) under the tongue every 5 (five) minutes as needed for chest pain. 03/19/21   Rehman, Joline Maxcy, MD  ondansetron (ZOFRAN-ODT) 4 MG disintegrating tablet Take 4 mg by mouth every 8 (eight) hours as needed for nausea or vomiting. 09/07/21   [provider]  pantoprazole (PROTONIX) 40 MG tablet Take 1 tablet (40 mg total) by mouth 2 (two) times daily. 01/03/22   Vassie Loll, MD  polycarbophil (FIBERCON) 625 MG tablet Take 2 tablets (1,250 mg total) by mouth 2 (two) times daily for 15 days. 03/19/23 04/03/23  Lanae Boast, MD  RESTASIS 0.05 % ophthalmic emulsion Place 1 drop into both eyes 2 (two) times daily. 12/12/20   [provider]  SPIRIVA RESPIMAT 2.5 MCG/ACT AERS Inhale 1 puff into the lungs daily. 01/03/22   Vassie Loll, MD  thiamine (VITAMIN B1) 100 MG tablet Take 1 tablet (100 mg total) by  mouth daily. 03/20/23 04/19/23  Lanae Boast, MD  triazolam (HALCION) 0.25 MG tablet Take 0.25 mg by mouth daily.    [provider]  vancomycin (VANCOCIN) 125 MG capsule Take 1 capsule (125 mg total) by mouth every other day for 8 days, THEN 1 capsule (125 mg total) every 3 (three) days for 9 days. 03/20/23 04/06/23  Lanae Boast, MD  Vitamin D, Ergocalciferol, (DRISDOL) 1.25 MG (50000 UNIT) CAPS capsule Take 50,000 Units by mouth once a week. Takes on Wednesdays. 12/14/21   [provider]    Physical Exam: Vitals:   04/03/23 1500 04/03/23 1526 04/03/23 1545 04/03/23 1615  BP: (!) 93/58 101/61 (!) 85/41 106/61  Pulse: 80 81 83 82  Resp: 11 (!) 23 (!) 21 14  Temp:      TempSrc:      SpO2: 100% 100% (!) 84% 100%  Weight:      Height:       Physical Exam Constitutional:      Appearance: She is ill-appearing.  HENT:     Head: Normocephalic and atraumatic.     Mouth/Throat:     Mouth: Mucous membranes are dry.     Pharynx: Oropharynx is clear. No oropharyngeal exudate.  Eyes:     General: No scleral icterus.    Extraocular Movements: Extraocular movements intact.     Conjunctiva/sclera: Conjunctivae normal.     Pupils: Pupils are equal, round, and reactive to light.  Cardiovascular:     Rate and Rhythm: Normal rate and regular rhythm.     Pulses: Normal pulses.     Heart sounds: Normal heart sounds. No murmur heard.    No friction rub. No gallop.  Pulmonary:     Effort: Pulmonary effort is normal.     Breath sounds: Normal breath sounds. No wheezing, rhonchi or rales.  Abdominal:     Tenderness: There is no guarding or rebound.     Comments: Soft, non-distended. TTP in  lower quadrants. Stoma site uncovered, pink, without any discharge or bleeding noted.   Musculoskeletal:        General: No swelling.     Cervical back: Normal range of motion.  Skin:    General: Skin is warm and dry.  Neurological:     General: No focal deficit present.     Mental Status: She  is alert and oriented to person, place, and time.     Data Reviewed:  There are no new results to review at this time.    Latest Ref Rng & Units 04/03/2023   11:30 AM 03/17/2023    5:00 AM 03/15/2023    4:40 AM  CBC  WBC 4.0 - 10.5 K/uL 12.2  8.0  9.4   Hemoglobin 12.0 - 15.0 g/dL 78.2  95.6  21.3   Hematocrit 36.0 - 46.0 % 39.0  31.9  33.5   Platelets 150 - 400 K/uL 834  656  677       Latest Ref Rng & Units 04/03/2023   11:30 AM 03/19/2023    4:20 AM 03/18/2023    5:00 AM  CMP  Glucose 70 - 99 mg/dL 086  93  87   BUN 6 - 20 mg/dL 34  6  9   Creatinine 5.78 - 1.00 mg/dL 4.69  6.29  5.28   Sodium 135 - 145 mmol/L 123  131  128   Potassium 3.5 - 5.1 mmol/L 3.9  3.5  3.6   Chloride 98 - 111 mmol/L 79  98  94   CO2 22 - 32 mmol/L 26  24  25    Calcium 8.9 - 10.3 mg/dL 8.9  9.5  9.6   Total Protein 6.5 - 8.1 g/dL 8.8     Total Bilirubin <1.2 mg/dL 0.5     Alkaline Phos 38 - 126 U/L 212     AST 15 - 41 U/L 47     ALT 0 - 44 U/L 35      Lactic Acid, Venous    Component Value Date/Time   LATICACIDVEN 2.2 (HH) 04/03/2023 1321   Urinalysis    Component Value Date/Time   COLORURINE YELLOW 04/03/2023 1342   APPEARANCEUR HAZY (A) 04/03/2023 1342   LABSPEC 1.012 04/03/2023 1342   PHURINE 5.0 04/03/2023 1342   GLUCOSEU NEGATIVE 04/03/2023 1342   GLUCOSEU NEG mg/dL 41/32/4401 0272   HGBUR NEGATIVE 04/03/2023 1342   BILIRUBINUR NEGATIVE 04/03/2023 1342   KETONESUR NEGATIVE 04/03/2023 1342   PROTEINUR NEGATIVE 04/03/2023 1342   UROBILINOGEN 0.2 12/26/2013 1530   NITRITE NEGATIVE 04/03/2023 1342   LEUKOCYTESUR TRACE (A) 04/03/2023 1342    CT ABDOMEN PELVIS WO CONTRAST  Result Date: 04/03/2023 CLINICAL DATA:  Abdominal pain.  Hypotension. EXAM: CT ABDOMEN AND PELVIS WITHOUT CONTRAST TECHNIQUE: Multidetector CT imaging of the abdomen and pelvis was performed following the standard protocol without IV contrast. RADIATION DOSE REDUCTION: This exam was performed according to the  departmental dose-optimization program which includes automated exposure control, adjustment of the mA and/or kV according to patient size and/or use of iterative reconstruction technique. COMPARISON:  CT 02/25/2023. FINDINGS: Lower chest: Improved left pleural effusion compared to previous. Improving left lung opacity. There is some mild opacity residual in the lingula dependently. There is also some opacity along the anterior aspect of the right lower lobe. A focal infiltrates possible. Recommend follow-up. Breathing motion. Heart is slightly enlarged. Trace pericardial fluid. Hepatobiliary: Heterogeneous liver with some fatty infiltration. Previous cholecystectomy. Pancreas: Unremarkable. No pancreatic ductal  dilatation or surrounding inflammatory changes. Spleen: Normal in size without focal abnormality. Adrenals/Urinary Tract: Stable slight thickening of the left adrenal gland. Right adrenal gland is preserved. No abnormal calcifications are seen within either kidney nor along the course of either ureter. Preserved contours of the distended urinary bladder there is persistent wall thickening of the bladder particularly superiorly as seen on coronal series 5, image 49 please correlate for any known history or further workup when appropriate Stomach/Bowel: Right lower quadrant ileostomy identified. On this non oral contrast exam the stomach and small bowel are nondilated. Large bowel is relatively decompressed as well. The previous drain is no longer identified. Areas of stranding along the mesentery on left side are decreasing with significant residual. No free air or free fluid. Vascular/Lymphatic: Normal caliber aorta and IVC. Minimal vascular calcifications no discrete abnormal lymph node enlargement identified in the abdomen and pelvis. Previous small node aortocaval measuring 8 mm in short axis on the prior, today measures 6 mm on series 2 image 34. Reproductive: Status post hysterectomy. No adnexal masses.  Other: Please see above Musculoskeletal: Curvature of the spine. Scattered degenerative changes. IMPRESSION: Decreasing mesenteric stranding, fluid with some residual. Previous drain is also no longer present. Right lower quadrant ostomy. No bowel obstruction.  The bowel overall is relatively decompressed. No obstructing renal stone. Improving pleural effusions and lung opacities with some residual opacity in the right lower lobe and lingula. Please correlate for signs of infiltrative recommend continued follow-up. Electronically Signed   By: Karen Kays M.D.   On: 04/03/2023 13:17   DG Chest Port 1 View  Result Date: 04/03/2023 CLINICAL DATA:  Possible sepsis. EXAM: PORTABLE CHEST 1 VIEW COMPARISON:  February 28, 2023. FINDINGS: The heart size and mediastinal contours are within normal limits. Left internal jugular catheter is noted with distal tip in expected position of the SVC. Both lungs are clear. The visualized skeletal structures are unremarkable. IMPRESSION: No active disease. Electronically Signed   By: Lupita Raider M.D.   On: 04/03/2023 12:42     Assessment and Plan: No notes have been filed under this hospital service. Service: Hospitalist  Septic Shock, unknown source possibly intra-abdominal - POA Fungemia 2/2 C. Glabrata - POA Abdominal pain Patient presenting with septic shock.  She was initially hypotensive to 80s/30s which decreased to 50s/40s prior to receiving IV fluids.  She has also been intermittently tachypneic.  She is afebrile but has a leukocytosis.  Lactic acid elevated x 2.  AKI with creatinine 2.79 from baseline creatinine of around 0.6.  Although her current source of infection is unclear, high suspicion for an intra-abdominal source given recent surgical history.  She was also found to have fungemia secondary to Candida glabrata during prior admission and is on micafungin therapy until 12/12.  Discussed with ID, recommended obtaining fungal cultures along with blood  cultures and transitioning from cefepime and Flagyl to ceftriaxone while adding micafungin and continuing vancomycin.  Patient's blood pressures have stabilized after receiving 3 L of IV fluid boluses, now staying in the 100s-110s/60s. -ID will follow, appreciate assistance -Continue vancomycin -Transition to ceftriaxone, stop cefepime and Flagyl -Start IV micafungin -Follow-up blood cultures, fungal cultures -Trend lactate until clears -LR @100cc /hr for 12 hours -If MAPs drop below 65, will need to consider pressor support if unamenable to fluids -Tylenol Q6 as needed for mild pain -Fentanyl patch 25 mcg/hour --restarting at home dose, reviewed per PDMP  Hyponatremia Noted during prior hospitalization as well.  Thought to be multifactorial.  Urine osm and urine sodium were low at that time, expect similar findings this time.  It is possible that this hyponatremia is more reflective of hypovolemic hyponatremia given she presented with shock.  Will reassess sodium levels tomorrow after fluid rehydration. -Check BMP tomorrow  AKI Cr 2.79 on admission, baseline appears to be around 0.6-0.7. Likely in the setting of septic shock and volume depletion. She has received 3L IVF boluses and I have started her on LR @100cc /hr for 12 hours. Will recheck kidney function tomorrow AM. -LR @100cc /hr for 12 hours -s/p 3L IVF -avoid nephrotoxic agents as able -trend kidney function, monitor UOP  Refractory Crohn's disease Recent history of fulminant C. difficile colitis  Status post colectomy (02/2023, Dr. Luisa Hart) Patient has ran out of her colostomy supplies and will need additional supplies at discharge.  It does not appear that she is currently on any medications for her Crohn's disease. -Colostomy supplies -Will need to follow-up with [facility] for further recommendations in regards to Crohns disease  Type 2 diabetes Does not appear to be on any medications for this at home.  Last A1c 5.8% 2 months  ago. -Trend morning CBGs, goal 1 40-1 80  Hypertension On Toprol-XL 25 mg daily, ivabradine 5 mg twice daily at home.  Given low BP in the setting of septic shock, will hold home medications currently. -Holding home antihypertensives in the setting of septic shock  History of DVT/PE Noted during recent hospitalization in 02/2023.  She is on Eliquis at home and will continue during hospitalization. -Continue home Eliquis  Thrombocytosis Noted during prior hospitalization as well.  Evaluated by hematology and felt to be inflammatory.  Studies at that time included BCR-ABL-1, CML/ALL PCR, JAK2 which were all negative.  Will continue to monitor  Hx of Cardiac arrest (01/2023) -telemetry  Seizure disorder -Continue home Keppra and gabapentin  GERD -Continue home PPI  COPD Not in exacerbation. -Continue LABA/ICS  Anxiety Patient is on Xanax 1 mg 3 times daily as needed and triazolam 0.25 mg daily.  Reviewed PDMP, she is appropriately obtaining Xanax and triazolam.  Will continue these medications while here at slightly reduced doses. -Reduced Xanax to 0.5 mg 2 times daily as needed -Continue home triazolam 0.25 mg daily   Advance Care Planning:   Code Status: Full Code Confirmed with patient at bedside.  Consults: none  Family Communication: none at bedside.  Severity of Illness: The appropriate patient status for this patient is INPATIENT. Inpatient status is judged to be reasonable and necessary in order to provide the required intensity of service to ensure the patient's safety. The patient's presenting symptoms, physical exam findings, and initial radiographic and laboratory data in the context of their chronic comorbidities is felt to place them at high risk for further clinical deterioration. Furthermore, it is not anticipated that the patient will be medically stable for discharge from the hospital within 2 midnights of admission.   * I certify that at the point of admission  it is my clinical judgment that the patient will require inpatient hospital care spanning beyond 2 midnights from the point of admission due to high intensity of service, high risk for further deterioration and high frequency of surveillance required.*  Portions of this note were generated with Dragon dictation software. Dictation errors may occur despite best attempts at proofreading.   Author: Briscoe Burns, MD 04/03/2023 4:41 PM  For on call review [email or web address].

## 2023-04-03 NOTE — ED Triage Notes (Signed)
Pt states colostomy bag came off while she was sleeping last night and states she does not have the supplies she needs to change bag at home. Pt states she's been at [facility] twice recently when bag came off. Pt also states PICC is "clogged up".

## 2023-04-04 DIAGNOSIS — E872 Acidosis, unspecified: Secondary | ICD-10-CM

## 2023-04-04 DIAGNOSIS — R6521 Severe sepsis with septic shock: Secondary | ICD-10-CM

## 2023-04-04 DIAGNOSIS — A419 Sepsis, unspecified organism: Secondary | ICD-10-CM

## 2023-04-04 LAB — LACTIC ACID, PLASMA
Lactic Acid, Venous: 2.7 mmol/L (ref 0.5–1.9)
Lactic Acid, Venous: 1.9 mmol/L (ref 0.5–1.9)
Lactic Acid, Venous: 1.9 mmol/L (ref 0.5–1.9)

## 2023-04-04 LAB — COMPREHENSIVE METABOLIC PANEL
ALT: 18 U/L (ref 0–44)
AST: 25 U/L (ref 15–41)
Albumin: 2.3 g/dL — ABNORMAL LOW (ref 3.5–5.0)
Alkaline Phosphatase: 118 U/L (ref 38–126)
Anion gap: 13 (ref 5–15)
BUN: 23 mg/dL — ABNORMAL HIGH (ref 6–20)
CO2: 22 mmol/L (ref 22–32)
Calcium: 7.6 mg/dL — ABNORMAL LOW (ref 8.9–10.3)
Chloride: 94 mmol/L — ABNORMAL LOW (ref 98–111)
Creatinine, Ser: 1.38 mg/dL — ABNORMAL HIGH (ref 0.44–1.00)
GFR, Estimated: 45 mL/min — ABNORMAL LOW (ref >60)
Glucose, Bld: 115 mg/dL — ABNORMAL HIGH (ref 70–99)
Potassium: 3.3 mmol/L — ABNORMAL LOW (ref 3.5–5.1)
Sodium: 129 mmol/L — ABNORMAL LOW (ref 135–145)
Total Bilirubin: 0.5 mg/dL (ref <1.2)
Total Protein: 5.2 g/dL — ABNORMAL LOW (ref 6.5–8.1)

## 2023-04-04 LAB — CBC
HCT: 28.1 % — ABNORMAL LOW (ref 36.0–46.0)
Hemoglobin: 9.4 g/dL — ABNORMAL LOW (ref 12.0–15.0)
MCH: 27.8 pg (ref 26.0–34.0)
MCHC: 33.5 g/dL (ref 30.0–36.0)
MCV: 83.1 fL (ref 80.0–100.0)
Platelets: 683 10*3/uL — ABNORMAL HIGH (ref 150–400)
RBC: 3.38 MIL/uL — ABNORMAL LOW (ref 3.87–5.11)
RDW: 14.4 % (ref 11.5–15.5)
WBC: 10.9 10*3/uL — ABNORMAL HIGH (ref 4.0–10.5)
nRBC: 0 % (ref 0.0–0.2)

## 2023-04-04 LAB — OSMOLALITY, URINE: Osmolality, Ur: 156 mosm/kg — ABNORMAL LOW (ref 300–900)

## 2023-04-04 LAB — PROTIME-INR
INR: 1.3 — ABNORMAL HIGH (ref 0.8–1.2)
Prothrombin Time: 16.7 s — ABNORMAL HIGH (ref 11.4–15.2)

## 2023-04-04 LAB — SODIUM, URINE, RANDOM: Sodium, Ur: 10 mmol/L

## 2023-04-04 MED ORDER — SODIUM CHLORIDE 0.9 % IV SOLN
INTRAVENOUS | Status: AC
  Administered 2023-04-04: 100 mL/h via INTRAVENOUS

## 2023-04-04 MED ORDER — ALPRAZOLAM 0.5 MG PO TABS
0.5000 mg | ORAL_TABLET | Freq: Two times a day (BID) | ORAL | Status: DC | PRN
  Administered 2023-04-04 – 2023-04-09 (×11): 0.5 mg via ORAL
  Filled 2023-04-04 (×11): qty 1

## 2023-04-04 MED ORDER — CHLORHEXIDINE GLUCONATE CLOTH 2 % EX PADS
6.0000 | MEDICATED_PAD | Freq: Every day | CUTANEOUS | Status: DC
  Administered 2023-04-04 – 2023-04-10 (×7): 6 via TOPICAL

## 2023-04-04 MED ORDER — HYDROMORPHONE HCL 1 MG/ML IJ SOLN
0.5000 mg | INTRAMUSCULAR | Status: DC | PRN
  Administered 2023-04-04 – 2023-04-10 (×16): 0.5 mg via INTRAVENOUS
  Filled 2023-04-04 (×17): qty 0.5

## 2023-04-04 MED ORDER — POTASSIUM CHLORIDE CRYS ER 20 MEQ PO TBCR
40.0000 meq | EXTENDED_RELEASE_TABLET | Freq: Two times a day (BID) | ORAL | Status: AC
  Administered 2023-04-04 (×2): 40 meq via ORAL
  Filled 2023-04-04 (×2): qty 2

## 2023-04-04 MED ORDER — ALTEPLASE 2 MG IJ SOLR
2.0000 mg | INTRAMUSCULAR | Status: AC
  Administered 2023-04-04: 2 mg
  Filled 2023-04-04: qty 2

## 2023-04-04 MED ORDER — SODIUM CHLORIDE 0.9 % IV SOLN: INTRAVENOUS | Status: DC

## 2023-04-04 MED ORDER — VANCOMYCIN HCL 125 MG PO CAPS
125.0000 mg | ORAL_CAPSULE | Freq: Two times a day (BID) | ORAL | Status: DC
  Administered 2023-04-04 – 2023-04-10 (×13): 125 mg via ORAL
  Filled 2023-04-04 (×19): qty 1

## 2023-04-04 MED ORDER — VANCOMYCIN HCL 750 MG/150ML IV SOLN
750.0000 mg | INTRAVENOUS | Status: DC
  Administered 2023-04-04 – 2023-04-05 (×2): 750 mg via INTRAVENOUS
  Filled 2023-04-04 (×2): qty 150

## 2023-04-04 NOTE — Consult Note (Addendum)
WOC Nurse ostomy follow up Stoma type/location: RLQ ileostomy  Stomal assessment/size: 1" round, above skin level  Peristomal assessment:  weeping denuded skin below stoma all the way to groin/upper thigh  Treatment options for stomal/peristomal skin: crusting of skin with no sting barrier wipe and stoma powder  Output green liquid effluent, approximately 300 mls when emptied and then copious amounts when attempting to apply Eakin  Ostomy pouching: WOC RN applied small Eakin to help skin heal; would apply a 2" barrier ring Hart Rochester [phone]), 2 1/4" convex skin barrier Hart Rochester [phone]) and 2 1/4" high output pouch Hart Rochester [phone]) prior to DC  Education provided:  Attempted to determine what patients biggest issues have been at home.  Patient states her bag "busted", when I asked questions it appears patient had leaking on sides of skin barrier and skin became broken down.  I asked patient about crusting with stoma powder which she said she did not know about. I removed current pouch and skin barrier, cleaned area and demonstrated how to crust with no sting barrier wipe and stoma powder.  I placed a 2" barrier ring around stoma.  Cut a 1" opening in small Eakin pouch Hart Rochester [phone]) and applied the Eakin to skin surrounding stoma.  I then attached this to bedside drainage bag.   Patient states when she got home she had 1 piece convex pouches and 2 piece skin barrier and pouches but did not know how to put them  together.  I reviewed with patient how to snap the pouch onto a skin barrier.    Patient states she has not followed up with surgeon since leaving [facility] (in fact could not tell me the name of the surgeon or even the name of the practice that did her surgery).  I reviewed with patient that this was Dr. Luisa Hart with [facility]. Patient asked that I write that down.  Patient did have a follow-up with nurse at [facility] 03/21/2023 with referral to home health.  Daughter then called and stated  they did not need home health, could get help through ostomy clinic.  Has not been to ostomy clinic. Patient noted to have follow-up appointment with Dr. Luisa Hart scheduled for today, 04/04/2023.   Patient states she has not ordered any supplies since leaving [facility].  I reviewed with her options for ordering (which we reviewed during her hospitalization) to include [employer], [place], and ostomy clinic at [facility].  I brought [employer] information to hospital for patient.  Also provided patient with ostomy clinic handout.  Patient states she does not have transportation to get to surgeons office or ostomy clinic.  I asked patient if she had home health coming out once she was discharged from [facility], patient could not answer this question.  Overall, patient appears to have little insight into her care or follow-up.   Enrolled patient in [place] Secure Start Discharge program: Yes previously   I have provided patient with educational booklet on ileostomy, reviewed crusting with stoma powder, demonstrated to patient how to use an ostomy belt, and provided information on ordering. I did not have a Engineer, site so will plan on providing patient with that as well. Left in room 2 small Eakin pouches Hart Rochester [phone]), (4) 1 piece convex pouches Hart Rochester [phone]), (7) 2 3/4" skin barriers Hart Rochester #2), (7) 2 3/4" pouches Hart Rochester [phone]) and (4) barrier rings Hart Rochester [phone]). Also provided with stoma powder Hart Rochester #6 and large ostomy belt Hart Rochester [phone].  Patient would benefit from home health to assist with management of ostomy.  I highly encouraged ostomy clinic as K. Mayo can help her with education, pouching difficulties and ordering supplies but patient says she does not have transportation.  I told her it is inappropriate to continue to present to Emergency Room for ostomy supplies, that she should be ordering a months worth of supplies at a time through her insurance.    Discussed  above with primary nurse.  WOC team will continue to be available to staff and patient as resource.   Thank you,    Priscella Mann MSN, RN-BC, [employer] [phone]

## 2023-04-04 NOTE — Progress Notes (Signed)
PROGRESS NOTE    [name]  KVQ:[id number] DOB: 05/28/1970 DOA: 04/03/2023 PCP: [name], PA-C   Brief Narrative:    [name] is a 52 y.o. female with medical history significant of recent colectomy with Dr. 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   She reports that she was initially in her usual state of health but after arrival to the ED she began having generalized abdominal pain.  She states that her stomach pain is located in the lower portion of her stomach and is fairly diffuse.  She does not recall any alleviating or aggravating factors given that abdominal pain began right after coming to the ED. she was admitted with septic shock, POA with likely intra-abdominal source.  ID consulted for assistance in patient was started on vancomycin and Rocephin and continued on micafungin due to her ongoing fungemia with Candida glabrata.  Assessment & Plan:   Principal Problem:   Septic shock (HCC)  Assessment and Plan:  Septic Shock, possibly related to stoma site infection- POA Fungemia 2/2 C. Glabrata - POA Abdominal pain -Appreciate ID recommendations with plans to continue vancomycin, Rocephin, and Flagyl  -Micafungin to continue through 12/12 with plan for p.o. at that time -Started on oral vancomycin twice daily for secondary prophylaxis while on IV antibiotics given history of C. Difficile -Blood and fungal cultures with no growth noted thus far   Hyponatremia -Check BMP tomorrow -Urine osmolality and sodium levels are low, follow-up serum  osmolality -Improved with use of normal saline, continue the same for now  Hypokalemia -Replete and reevaluate in a.m.   AKI-improving Cr 2.79 on admission, baseline appears to be around 0.6-0.7. Likely in the setting of septic shock and volume depletion. She has received 3L IVF boluses and I have started her on LR @100cc /hr for 12 hours. Will recheck kidney function tomorrow AM. -Continue on normal saline  Refractory Crohn's disease Recent history of fulminant C. difficile colitis  Status post colectomy (02/2023, Dr. Luisa Hart) Patient has ran out of her colostomy supplies and will need additional supplies at discharge.  It does not appear that she is currently on any medications for her Crohn's disease. -Colostomy supplies -Will need to follow-up with [facility] for further recommendations in regards to Crohns disease -Oral vancomycin per ID   Type 2 diabetes Does not appear to be on any medications for this at home.  Last A1c 5.8% 2 months ago. -Trend morning CBGs, goal 140-180   Hypertension, questionable. -States blood pressures are typically 90/50 at home, should not require antihypertensives -Holding home antihypertensives in the setting of septic shock -Wean norepinephrine as tolerated   History of DVT/PE Noted during recent hospitalization in 02/2023.  She is on Eliquis at home and will continue during hospitalization. -Continue home Eliquis   Thrombocytosis Noted during prior hospitalization as well.  Evaluated by hematology and felt to be inflammatory.  Studies at that time included BCR-ABL-1, CML/ALL PCR, JAK2 which were all negative.  Will continue to monitor   Hx of Cardiac arrest (01/2023) -telemetry   Seizure disorder -Continue home Keppra and gabapentin   GERD -Continue home PPI   COPD Not in exacerbation. -Continue  LABA/ICS   Anxiety Patient is on Xanax 1 mg 3 times daily as needed and triazolam 0.25 mg daily.  Reviewed PDMP, she is appropriately obtaining  Xanax and triazolam.  Will continue these medications while here at slightly reduced doses. -Reduced Xanax to 0.5 mg 2 times daily as needed -Continue home triazolam 0.25 mg daily    DVT prophylaxis:Eliquis Code Status: Full Family Communication: None at bedside Disposition Plan:  Status is: Inpatient Remains inpatient appropriate because: Need for IV medications.   Consultants:  ID  Procedures:  None  Antimicrobials:  Anti-infectives (From admission, onward)    Start     Dose/Rate Route Frequency Ordered Stop   04/04/23 1400  vancomycin (VANCOREADY) IVPB 750 mg/150 mL        750 mg 150 mL/hr over 60 Minutes Intravenous Every 24 hours 04/04/23 1113     04/04/23 1300  vancomycin (VANCOCIN) capsule 125 mg        125 mg Oral 2 times daily 04/04/23 1203     04/04/23 1200  cefTRIAXone (ROCEPHIN) 2 g in sodium chloride 0.9 % 100 mL IVPB        2 g 200 mL/hr over 30 Minutes Intravenous Every 24 hours 04/03/23 1705     04/04/23 1000  ceFEPIme (MAXIPIME) 2 g in sodium chloride 0.9 % 100 mL IVPB  Status:  Discontinued        2 g 200 mL/hr over 30 Minutes Intravenous Every 24 hours 04/03/23 1323 04/03/23 1705   04/04/23 1000  micafungin (MYCAMINE) 150 mg in sodium chloride 0.9 % 100 mL IVPB        150 mg 107.5 mL/hr over 1 Hours Intravenous Every 24 hours 04/03/23 1704     04/03/23 2300  metroNIDAZOLE (FLAGYL) IVPB 500 mg  Status:  Discontinued        500 mg 100 mL/hr over 60 Minutes Intravenous Every 12 hours 04/03/23 1635 04/03/23 1704   04/03/23 1600  vancomycin (VANCOREADY) IVPB 500 mg/100 mL        500 mg 100 mL/hr over 60 Minutes Intravenous  Once 04/03/23 1322 04/03/23 2031   04/03/23 1323  vancomycin variable dose per unstable renal function (pharmacist dosing)         Does not apply See admin instructions 04/03/23 1323     04/03/23 1200  ceFEPIme (MAXIPIME) 2 g in sodium chloride 0.9 % 100 mL IVPB        2 g 200 mL/hr over 30 Minutes Intravenous  Once 04/03/23 1152  04/03/23 1231   04/03/23 1200  metroNIDAZOLE (FLAGYL) IVPB 500 mg        500 mg 100 mL/hr over 60 Minutes Intravenous  Once 04/03/23 1152 04/03/23 1311   04/03/23 1200  vancomycin (VANCOCIN) IVPB 1000 mg/200 mL premix        1,000 mg 200 mL/hr over 60 Minutes Intravenous  Once 04/03/23 1152 04/03/23 1326      Subjective: Patient seen and evaluated today with no new acute complaints or concerns. No acute concerns or events noted overnight.  Objective: Vitals:   04/04/23 0900 04/04/23 0930 04/04/23 1200 04/04/23 1209  BP: (!) 111/54 (!) 95/42 (!) 106/41   Pulse: (!) 114 (!) 114 (!) 121   Resp: 19 (!) 23 15   Temp:    (!) 97.2 F (36.2 C)  TempSrc:    Oral  SpO2: 94% 97% 100%   Weight:      Height:        Intake/Output Summary (  Last 24 hours) at 04/04/2023 1239 Last data filed at 04/04/2023 1219 Gross per 24 hour  Intake 4593.71 ml  Output 150 ml  Net 4443.71 ml   Filed Weights   04/03/23 1115 04/04/23 0545  Weight: 59 kg 58.8 kg    Examination:  General exam: Appears calm and comfortable  Respiratory system: Clear to auscultation. Respiratory effort normal. Cardiovascular system: S1 & S2 heard, RRR.  Gastrointestinal system: Abdomen is soft, ostomy present with liquidy stools Central nervous system: Alert and awake Extremities: No edema Skin: No significant lesions noted, PICC line to left chest wall without any erythema or drainage Psychiatry: Flat affect.    Data Reviewed: I have personally reviewed following labs and imaging studies  CBC: Recent Labs  Lab 04/03/23 1130 04/04/23 0312  WBC 12.2* 10.9*  NEUTROABS 8.7*  --   HGB 13.3 9.4*  HCT 39.0 28.1*  MCV 82.1 83.1  PLT 834* 683*   Basic Metabolic Panel: Recent Labs  Lab 04/03/23 1130 04/04/23 0312  NA 123* 129*  K 3.9 3.3*  CL 79* 94*  CO2 26 22  GLUCOSE 104* 115*  BUN 34* 23*  CREATININE 2.79* 1.38*  CALCIUM 8.9 7.6*   GFR: Estimated Creatinine Clearance: 38.6 mL/min (A) (by C-G  formula based on SCr of 1.38 mg/dL (H)). Liver Function Tests: Recent Labs  Lab 04/03/23 1130 04/04/23 0312  AST 47* 25  ALT 35 18  ALKPHOS 212* 118  BILITOT 0.5 0.5  PROT 8.8* 5.2*  ALBUMIN 3.8 2.3*   No results for input(s): "LIPASE", "AMYLASE" in the last 168 hours. No results for input(s): "AMMONIA" in the last 168 hours. Coagulation Profile: Recent Labs  Lab 04/03/23 1130 04/04/23 0312  INR 1.5* 1.3*   Cardiac Enzymes: No results for input(s): "CKTOTAL", "CKMB", "CKMBINDEX", "TROPONINI" in the last 168 hours. BNP (last 3 results) No results for input(s): "PROBNP" in the last 8760 hours. HbA1C: No results for input(s): "HGBA1C" in the last 72 hours. CBG: No results for input(s): "GLUCAP" in the last 168 hours. Lipid Profile: No results for input(s): "CHOL", "HDL", "LDLCALC", "TRIG", "CHOLHDL", "LDLDIRECT" in the last 72 hours. Thyroid Function Tests: No results for input(s): "TSH", "T4TOTAL", "FREET4", "T3FREE", "THYROIDAB" in the last 72 hours. Anemia Panel: No results for input(s): "VITAMINB12", "FOLATE", "FERRITIN", "TIBC", "IRON", "RETICCTPCT" in the last 72 hours. Sepsis Labs: Recent Labs  Lab 04/03/23 1950 04/03/23 2334 04/04/23 0312 04/04/23 0619  LATICACIDVEN 2.3* 2.7* 1.9 1.9    Recent Results (from the past 240 hour(s))  Blood Culture (routine x 2)     Status: None (Preliminary result)   Collection Time: 04/03/23 11:30 AM   Specimen: BLOOD RIGHT ARM  Result Value Ref Range Status   Specimen Description   Final    BLOOD RIGHT ARM BOTTLES DRAWN AEROBIC AND ANAEROBIC   Special Requests Blood Culture adequate volume  Final   Culture   Final    NO GROWTH < 24 HOURS Performed at [facility], [address], [place], [place] [address]    Report Status PENDING  Incomplete  Blood Culture (routine x 2)     Status: None (Preliminary result)   Collection Time: 04/03/23 11:35 AM   Specimen: BLOOD LEFT ARM  Result Value Ref Range Status   Specimen  Description BLOOD LEFT ARM BOTTLES DRAWN AEROBIC AND ANAEROBIC  Final   Special Requests Blood Culture adequate volume  Final   Culture   Final    NO GROWTH < 24 HOURS Performed at [facility], [address], [place], [place] [address]    Report Status PENDING  Incomplete  MRSA Next Gen by PCR, Nasal     Status: None   Collection Time: 04/03/23  1:57 PM   Specimen: Nasal Mucosa; Nasal Swab  Result Value Ref Range Status   MRSA by PCR Next Gen NOT DETECTED NOT DETECTED Final    Comment: (NOTE) The GeneXpert MRSA Assay (FDA approved for NASAL specimens only), is one component of a comprehensive MRSA colonization surveillance program. It is not intended to diagnose MRSA infection nor to guide or monitor treatment for MRSA infections. Test performance is not FDA approved in patients less than 40 years old. Performed at [facility], [address] [place], [place] [address]   Fungus culture, blood     Status: None (Preliminary result)   Collection Time: 04/03/23  5:49 PM   Specimen: BLOOD RIGHT HAND  Result Value Ref Range Status   Specimen Description BLOOD RIGHT HAND  Final   Special Requests   Final    BOTTLES DRAWN AEROBIC ONLY Blood Culture results may not be optimal due to an inadequate volume of blood received in culture bottles   Culture   Final    NO GROWTH < 12 HOURS Performed at [facility], [address] [place], [place] [address]    Report Status PENDING  Incomplete         Radiology Studies: CT ABDOMEN PELVIS WO CONTRAST  Result Date: 04/03/2023 CLINICAL DATA:  Abdominal pain.  Hypotension. EXAM: CT ABDOMEN AND PELVIS WITHOUT CONTRAST TECHNIQUE: Multidetector CT imaging of the abdomen and pelvis was performed following the standard protocol without IV contrast. RADIATION DOSE REDUCTION: This exam was performed according to the departmental dose-optimization program which includes automated exposure control, adjustment of the mA and/or kV according to patient size  and/or use of iterative reconstruction technique. COMPARISON:  CT 02/25/2023. FINDINGS: Lower chest: Improved left pleural effusion compared to previous. Improving left lung opacity. There is some mild opacity residual in the lingula dependently. There is also some opacity along the anterior aspect of the right lower lobe. A focal infiltrates possible. Recommend follow-up. Breathing motion. Heart is slightly enlarged. Trace pericardial fluid. Hepatobiliary: Heterogeneous liver with some fatty infiltration. Previous cholecystectomy. Pancreas: Unremarkable. No pancreatic ductal dilatation or surrounding inflammatory changes. Spleen: Normal in size without focal abnormality. Adrenals/Urinary Tract: Stable slight thickening of the left adrenal gland. Right adrenal gland is preserved. No abnormal calcifications are seen within either kidney nor along the course of either ureter. Preserved contours of the distended urinary bladder there is persistent wall thickening of the bladder particularly superiorly as seen on coronal series 5, image 49 please correlate for any known history or further workup when appropriate Stomach/Bowel: Right lower quadrant ileostomy identified. On this non oral contrast exam the stomach and small bowel are nondilated. Large bowel is relatively decompressed as well. The previous drain is no longer identified. Areas of stranding along the mesentery on left side are decreasing with significant residual. No free air or free fluid. Vascular/Lymphatic: Normal caliber aorta and IVC. Minimal vascular calcifications no discrete abnormal lymph node enlargement identified in the abdomen and pelvis. Previous small node aortocaval measuring 8 mm in short axis on the prior, today measures 6 mm on series 2 image 34. Reproductive: Status post hysterectomy. No adnexal masses. Other: Please see above Musculoskeletal: Curvature of the spine. Scattered degenerative changes. IMPRESSION: Decreasing mesenteric  stranding, fluid with some residual. Previous drain is also no longer  present. Right lower quadrant ostomy. No bowel obstruction.  The bowel overall is relatively decompressed. No obstructing renal stone. Improving pleural effusions and lung opacities with some residual opacity in the right lower lobe and lingula. Please correlate for signs of infiltrative recommend continued follow-up. Electronically Signed   By: Karen Kays M.D.   On: 04/03/2023 13:17   DG Chest Port 1 View  Result Date: 04/03/2023 CLINICAL DATA:  Possible sepsis. EXAM: PORTABLE CHEST 1 VIEW COMPARISON:  February 28, 2023. FINDINGS: The heart size and mediastinal contours are within normal limits. Left internal jugular catheter is noted with distal tip in expected position of the SVC. Both lungs are clear. The visualized skeletal structures are unremarkable. IMPRESSION: No active disease. Electronically Signed   By: Lupita Raider M.D.   On: 04/03/2023 12:42        Scheduled Meds:  alteplase  2 mg Intracatheter STAT   amitriptyline  100 mg Oral QHS   apixaban  5 mg Oral BID   Chlorhexidine Gluconate Cloth  6 each Topical Daily   fentaNYL  1 patch Transdermal Q72H   fluticasone furoate-vilanterol  1 puff Inhalation Daily   gabapentin  300 mg Oral BID   levETIRAcetam  750 mg Oral BID   pantoprazole  40 mg Oral Daily   sodium chloride flush  10 mL Intravenous Q12H   vancomycin  125 mg Oral BID   vancomycin variable dose per unstable renal function (pharmacist dosing)   Does not apply See admin instructions   Continuous Infusions:  cefTRIAXone (ROCEPHIN)  IV     micafungin (MYCAMINE) 150 mg in sodium chloride 0.9 % 100 mL IVPB 108 mL/hr at 04/04/23 1219   norepinephrine (LEVOPHED) Adult infusion 2 mcg/min (04/04/23 1219)   vancomycin       LOS: 1 day    Critical care time: 55 minutes    Uniqua Kihn Hoover Brunette, DO [facility]  If 7PM-7AM, please contact night-coverage [email or web address] 04/04/2023, 12:39 PM

## 2023-04-04 NOTE — TOC Initial Note (Signed)
Transition of Care [facility]) - Initial/Assessment Note    Patient Details  Name: [name] MRN: [id number] Date of Birth: October 02, 1970  Transition of Care [facility]) CM/SW Contact:    Leitha Bleak, RN Phone Number: 04/04/2023, 1:38 PM  Clinical Narrative:     Patient admitted with Septic shock, recently assessed and long admissions and high risk score. Patient declined SNF, went home with family, DME and [facility] for IV treatment. Patient will need 2 plus days and PT eval for TOC to work on discharge planning. No new needs at this time. TOC following .           Expected Discharge Plan: Home/Self Care Barriers to Discharge: Continued Medical Work up   Patient Goals and CMS Choice Patient states their goals for this hospitalization and ongoing recovery are:: return home   Choice offered to / list presented to : Patient     Expected Discharge Plan and Services      Living arrangements for the past 2 months: Single Family Home                    Prior Living Arrangements/Services Living arrangements for the past 2 months: Single Family Home Lives with:: Adult Children            Current home services: DME   Activities of Daily Living   ADL Screening (condition at time of admission) Independently performs ADLs?: Yes (appropriate for developmental age) Is the patient deaf or have difficulty hearing?: No Does the patient have difficulty seeing, even when wearing glasses/contacts?: No Does the patient have difficulty concentrating, remembering, or making decisions?: No  Permission Sought/Granted                  Emotional Assessment     Affect (typically observed): Accepting Orientation: : Oriented to Self, Oriented to Place, Oriented to  Time, Oriented to Situation Alcohol / Substance Use: Not Applicable Psych Involvement: No (comment)  Admission diagnosis:  Hyponatremia [E87.1] AKI (acute kidney injury) (HCC) [N17.9] Septic shock (HCC) [A41.9, R65.21] Patient  Active Problem List   Diagnosis Date Noted   Septic shock (HCC) 04/03/2023   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   Cardiac arrest (HCC) 01/27/2023   Acute respiratory failure with hypoxia (HCC) 01/27/2023   Upper GI bleed 01/27/2023   GI bleed 01/15/2022   Rectal bleeding 01/12/2022   Acute deep vein thrombosis (DVT) of brachial vein of right upper extremity (HCC)    Enteritis due to Clostridium difficile 12/24/2021   Proctocolitis 12/23/2021   UTI (urinary tract infection) 12/23/2021   Thrombocytosis 10/02/2021   Gait instability 10/02/2021   Hypokalemia 10/01/2021   Skin test reaction, systemic 09/07/2021   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   N&V (nausea and vomiting) 04/30/2021   Abdominal pain    Bloating    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HCC)    Hematochezia 02/04/2021   Crohn's disease of large intestine with other complication (HCC) 02/03/2021   Diarrhea    Metabolic encephalopathy 01/20/2021   Moderate  Pericardial effusion 01/19/2021   Right foot pain    AKI (acute kidney injury) (HCC) 01/16/2021   Rhabdomyolysis 01/16/2021   Lactic acidosis 01/16/2021   COVID-19 virus infection 01/16/2021   Shock (HCC) 01/16/2021   Seizure (HCC) 06/24/2020   MDD (major depressive disorder) 03/24/2020   Chest pain 02/07/2020   Chronic patellofemoral pain of both knees 01/11/2020   Essential hypertension 06/17/2018   Insomnia 12/13/2017   Neuropathy 12/13/2017   Chest pain at rest  05/09/2016   COPD (chronic obstructive pulmonary disease) (HCC)    Crohn's disease with complication (HCC)    Diabetes mellitus without complication (HCC)    Anxiety    Cigarette smoker    PTSD (post-traumatic stress disorder)    Palpitation    Constipation 09/20/2012   Head injury 05/09/2012   Hyperlipidemia 04/11/2012   DJD (degenerative joint disease), lumbar 04/04/2012   Paroxysmal SVT (supraventricular tachycardia) (HCC) 04/04/2012   Tobacco dependence 04/04/2012   DOE (dyspnea on exertion) 01/26/2012   Paresthesia 11/25/2011   Abdominal pain, chronic, epigastric 11/01/2011   Abdominal pain, acute, periumbilical 11/01/2011   GERD (gastroesophageal reflux disease)    Hyperglycemia    Chronic low back pain    IBS (irritable bowel syndrome)    Closed head injury    TOBACCO ABUSE 07/13/2010   Palpitations 07/13/2010   Pleuritic chest pain 07/13/2010   Anxiety state 07/03/2008   BACK PAIN, LUMBAR, CHRONIC 07/03/2008   PCP:  [name], PA-C Pharmacy:   [id number] - [place], [place] - [address] [place] [address] Phone: [phone] Fax: [phone]  [facility], [place] - [address] [place] [address] Phone: [phone] Fax: [phone]  [facility] [address], [place] - [address] [facility] [address] [place] [address] Phone: [phone] Fax: [phone]  [facility] [address] [place] - [address] [place] [address] [place] [address] [place] [address] Phone: [phone] Fax: [phone]  [name] [address] [place] [address] Phone: [phone] Fax: [phone]     Social Determinants of Health (SDOH) Social History: SDOH Screenings   Food Insecurity: No Food Insecurity (04/03/2023)  Housing: Low Risk  (04/03/2023)  Transportation Needs: No Transportation Needs (04/03/2023)  Utilities: Not At Risk (04/03/2023)  Financial Resource  Strain: Medium Risk (10/18/2022)   Received from [facility]  Social Connections: Unknown (09/01/2021)   Received from [facility], [facility]  Stress: Stress Concern Present (06/25/2020)   Received from [facility], [facility]  Tobacco Use: High Risk (04/03/2023)   SDOH Interventions:     Readmission Risk Interventions    04/04/2023    1:36 PM  Readmission Risk Prevention Plan  Transportation Screening Complete  Medication Review (RN Care Manager) Complete  PCP or Specialist appointment within 3-5 days of discharge Not Complete  HRI or Home Care Consult Not Complete  SW Recovery Care/Counseling Consult Not Complete  Palliative Care Screening Not Applicable  Skilled Nursing Facility Not Applicable

## 2023-04-04 NOTE — Progress Notes (Signed)
Per Dr. Sherryll Burger, titrate Levophed gtt to a systolic blood pressure >90. Patient's blood pressures run lower. Also, received an okay from Dr. Sherryll Burger to use the PICC line.

## 2023-04-04 NOTE — Progress Notes (Signed)
Patient's PICC line dressing changed. Difficulty with flushing the red colored lumen. No difficulty with the purple colored lumen. Cathflo ordered.

## 2023-04-04 NOTE — Progress Notes (Signed)
Progress note  RN called due to patient's lactic acid worsening despite being on IV fluid.  Chart was reviewed and it was noted that patient was admitted for septic shock possibly due to intra-abdominal cause.  She was also diagnosed to have fungemia during prior admission and is on micafungin until 12/12. She was also noted to have hyponatremia.  Patient was on IV LR, this was changed to IV NS with improvement in sodium level.  Lactic acid also have resolved.  Na 123 > 129 Lactic acid 2.0 > 2.3 > 2.7 > 1.9  Urine osmolality, serum osmolality and urine sodium will be checked to determine the cause of hyponatremia.  Total time:  31 minutes This includes time reviewing the chart including progress notes, labs, EKGs, taking medical decisions, ordering labs and documenting findings.  Please refer to admission H&P for details regarding the care of this patient.

## 2023-04-04 NOTE — Progress Notes (Signed)
Patient's PICC line still with difficulty in purple lumen. Unable to get cathflo into port. Will consult IV team for assistance.

## 2023-04-04 NOTE — Plan of Care (Signed)
  Problem: Education: Goal: Knowledge of General Education information will improve Description: Including pain rating scale, medication(s)/side effects and non-pharmacologic comfort measures Outcome: Progressing   Problem: Clinical Measurements: Goal: Ability to maintain clinical measurements within normal limits will improve Outcome: Progressing   Problem: Activity: Goal: Risk for activity intolerance will decrease Outcome: Progressing   Problem: Pain Management: Goal: General experience of comfort will improve Outcome: Progressing   Problem: Safety: Goal: Ability to remain free from injury will improve Outcome: Progressing

## 2023-04-04 NOTE — Consult Note (Addendum)
WOC Nurse ostomy consult note; this patient is well known to WOC team from extended previous admission at [facility]; last seen by [facility] for teaching session 03/17/2023 Stoma type/location:  RLQ ileostomy  Stomal assessment/size: 1 1/4" round on last assessment  Peristomal assessment:  denuded, red, weeping  Treatment options for stomal/peristomal skin:  skin should be crusted with stoma powder Hart [place] #6) and no sting barrier wipes; 2" barrier ring  Output liquid brown per bedside nurse  Ostomy pouching: patient needs convexity; Last placed in a one piece convex Hart Rochester [phone].  I will speak with materials to determine if we can place her in a 2 1/4" convex skin barrier Hart Rochester [phone]) and a 2 1/4" high output pouch Hart Rochester [phone]) that could be connected to bedside drainage bag (foley bag)  Education provided: none, patient has been educated many (10-12) times on last admission; last received education on 03/17/2023.   Enrolled patient in [employer] DC program: no, previously enrolled twice   CRUSTING TECHNIQUE DENUDED WEEPING SKIN  Sprinkle stoma powder Hart Rochester #6) over any irritated skin, brush away excess powder Tap lightly over the powder with Cavilon No-Sting barrier wipe (skin barrier wipe-white and blue package)  Allow to dry Apply another layer of powder following the same steps up to three layers.  After dry proceed with routine pouching    WOC team will plan to visit later in shift (04/04/2023) if necessary.  Patient would likely also benefit from an ostomy belt Hart Rochester [phone]).    Thank you,    Priscella Mann MSN, RN-BC, [employer] [phone]

## 2023-04-04 NOTE — Consult Note (Signed)
Virtual Visit via Telephone/Video Note   I connected with [name]   On 04/04/2023 at 12:10 PM  by Video and verified that I am speaking with the correct person using two identifiers.   I discussed the limitations, risks, security and privacy concerns of performing an evaluation and management service by video. 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 #History of intra-abdominal infection C. difficile requiring colectomy on 10/11 - Patient was having abdominal pain, blood pressure 880s over 30s, WBC 12.2 K.  CT abdomen pelvis without acute intra-abdominal infection. 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  Hospital course s placated by Candida glabrata fungemia, infarcts in spleen and emboli to the lung concern.  Embolic fungal endocarditis plan was to treat with 6 weeks of micafungin 11/27+/-p.o. antifungals given no sedation seen on bowels pending clinical progression.  The biotics were extended on 11/26 to 12/12 patient missed her appointment with Dr. Algis Liming, antibiotics extended.  There is also a prior Auth for [employer] has been approved.   ID was engaged and recommended differentiating, transition cefepime to ceftriaxone. Recommendations:  -Continue micafungin - Blood and fungal cultures - Continue vancomycin and ceftriaxone with Flagyl  for now - Patient has a history of fulminant C. difficile, will treat at vancomycin 125 mg p.o. twice daily for secondary prophylaxis while on IV antibiotics.  Patient says her stoma bag keeps falling off ever since discharge.  This diet does not appear red, it was hard for me to appreciate during video visit but nursing at bedside confirmed.  Suspect may have had an overlying cellulitis around stoma site.  She reports she is having Oestromon manage today.  There is also stoma clinic which she can be followed at. Microbiology:   Antibiotics: 12/1 cefepime 12/1 metronidazole-present vancomycin 12/20  Cultures: Blood 12/1 fungal blood cultures Urine  Other   HPI: [name] is a 52 y.o. female with past medical history significant for recent colectomy with Dr. Luisa Hart on 10/11 for refractory Crohn's disease and necrosis of the bowel secondary to fulminant C. difficile colitis, fungemia secondary to Candida glabrata infection on micafungin through 12/12 followed by infectious disease, COPD, hypertension, type 2 diabetes, DVT on Eliquis, seizure disorder, anxiety, normocytic anemia initially presented to ED due to her supplies of stoma was running up.  She reported some generalized abdominal pain.  Associated nausea.  On arrival her blood pressure 80s/33, CBC showed WBC 12.2 K.  CT abdomen pelvis revealed decreasing mesenteric stranding, no concern for acute infection.  She was admitted for septic shock secondary to possible intra-abdominal etiology.  ID was engaged.   Review of Systems: Review of Systems  All other systems reviewed and are negative.   Past Medical History:  Diagnosis Date   Anxiety    Asthma    Chronic low back pain    Collagen vascular disease (HCC)    COPD (  chronic obstructive pulmonary disease) (HCC)    Crohn's disease (HCC)    Fungal endocarditis 03/28/2023   GERD (gastroesophageal reflux disease)    EGD 01/2007 by Dr.Rourke small hiatal hernia s/p 56 french  maloney    History of head injury    Hypertension    IBS (irritable bowel syndrome)    PSVT (paroxysmal supraventricular tachycardia) (HCC)    PTSD (post-traumatic stress disorder)    Recurrent chest pain    Seizure disorder (HCC)    Type 2 diabetes mellitus (HCC)     Social History   Tobacco Use   Smoking status: Every Day    Current packs/day: 0.50    Average packs/day: 0.5 packs/day for 38.5 years (19.3 ttl pk-yrs)    Types: Cigarettes    Start date: 09/26/1984    Passive exposure: Current   Smokeless tobacco: Never  Vaping Use   Vaping status: Never Used  Substance Use Topics   Alcohol use: No    Alcohol/week: 0.0 standard drinks of alcohol   Drug use: Yes    Types: Marijuana    Family History  Problem Relation Age of Onset   Cancer Mother    Heart failure Mother    Hypertension Mother    Heart attack Mother    Heart attack Father 85   Cancer Father 28   Heart failure Father    Diabetes Father    Crohn's disease Cousin    Scheduled Meds:  amitriptyline  100 mg Oral QHS   apixaban  5 mg Oral BID   Chlorhexidine Gluconate Cloth  6 each Topical Daily   fentaNYL  1 patch Transdermal Q72H   fluticasone furoate-vilanterol  1 puff Inhalation Daily   gabapentin  300 mg Oral BID   levETIRAcetam  750 mg Oral BID   pantoprazole  40 mg Oral Daily   sodium chloride flush  10 mL Intravenous Q12H   vancomycin variable dose per unstable renal function (pharmacist dosing)   Does not apply See admin instructions   Continuous Infusions:  sodium chloride Stopped (04/03/23 1901)   sodium chloride 50 mL/hr at 04/04/23 1052   cefTRIAXone (ROCEPHIN)  IV     micafungin (MYCAMINE) 150 mg in sodium chloride 0.9 % 100 mL IVPB 150 mg (04/04/23 1136)   norepinephrine (LEVOPHED) Adult infusion 3 mcg/min (04/04/23 1052)   vancomycin     PRN Meds:.acetaminophen **OR** acetaminophen, ALPRAZolam, mouth rinse Allergies  Allergen Reactions   Codeine Shortness Of Breath, Swelling and  Rash    Throat swelling   Hydrocodone Shortness Of Breath, Swelling and Rash   Ketorolac Nausea And Vomiting, Swelling and Nausea Only    Pt reports n/v and swelling to throat   Morphine And Codeine Shortness Of Breath, Swelling and Rash   Penicillins Shortness Of Breath, Swelling and Other (See Comments)    Throat swells 02/06/21--TOLERATES CEFTRIAXONE     Nickel Rash   Pregabalin Other (See Comments)   Acetaminophen     Per MD patient states she can't take Tylenol because of liver enzymes   Atarax [Hydroxyzine] Itching and Swelling    Mildly swollen throat   Ativan [Lorazepam] Other (See Comments)    Migraines.   Oxycodone-Acetaminophen Nausea Only and Nausea And Vomiting   Strawberry Extract Swelling   Watermelon Concentrate Swelling   Albuterol Palpitations   Benadryl [Diphenhydramine Hcl] Palpitations   Humira [Adalimumab] Rash   Latex Rash   Remicade [Infliximab] Rash    Blisters and Welts    Tramadol Rash  Rash and itching    OBJECTIVE: Blood pressure (!) 95/42, pulse (!) 114, temperature 98.2 F (36.8 C), temperature source Oral, resp. rate (!) 23, height 5\' 3"  (1.6 m), weight 58.8 kg, SpO2 97%.  Physical Exam Constitutional:      Appearance: Normal appearance.  HENT:     Head: Normocephalic and atraumatic.     Right Ear: Tympanic membrane normal.     Left Ear: Tympanic membrane normal.     Nose: Nose normal.     Mouth/Throat:     Mouth: Mucous membranes are moist.  Eyes:     Extraocular Movements: Extraocular movements intact.     Conjunctiva/sclera: Conjunctivae normal.     Pupils: Pupils are equal, round, and reactive to light.  Cardiovascular:     Rate and Rhythm: Normal rate and regular rhythm.     Heart sounds: No murmur heard.    No friction rub. No gallop.  Pulmonary:     Effort: Pulmonary effort is normal.     Breath sounds: Normal breath sounds.  Abdominal:     General: Abdomen is flat.     Palpations: Abdomen is soft.  Musculoskeletal:         General: Normal range of motion.  Skin:    General: Skin is warm and dry.  Neurological:     General: No focal deficit present.     Mental Status: She is alert and oriented to person, place, and time.  Psychiatric:        Mood and Affect: Mood normal.     Lab Results Lab Results  Component Value Date   WBC 10.9 (H) 04/04/2023   HGB 9.4 (L) 04/04/2023   HCT 28.1 (L) 04/04/2023   MCV 83.1 04/04/2023   PLT 683 (H) 04/04/2023    Lab Results  Component Value Date   CREATININE 1.38 (H) 04/04/2023   BUN 23 (H) 04/04/2023   NA 129 (L) 04/04/2023   K 3.3 (L) 04/04/2023   CL 94 (L) 04/04/2023   CO2 22 04/04/2023    Lab Results  Component Value Date   ALT 18 04/04/2023   AST 25 04/04/2023   ALKPHOS 118 04/04/2023   BILITOT 0.5 04/04/2023       Danelle Earthly, MD [facility] 04/04/2023, 11:49 AM I have personally spent 82 minutes involved in face-to-face and non-face-to-face activities for this patient on the day of the visit. Professional time spent includes the following activities: Preparing to see the patient (review of tests), Obtaining and/or reviewing separately obtained history (admission/discharge record), Performing a medically appropriate examination and/or evaluation , Ordering medications/tests/procedures, referring and communicating with other health care professionals, Documenting clinical information in the EMR, Independently interpreting results (not separately reported), Communicating results to the patient/family/caregiver, Counseling and educating the patient/family/caregiver and Care coordination (not separately reported).

## 2023-04-05 DIAGNOSIS — A419 Sepsis, unspecified organism: Secondary | ICD-10-CM | POA: Diagnosis not present

## 2023-04-05 DIAGNOSIS — R6521 Severe sepsis with septic shock: Secondary | ICD-10-CM | POA: Diagnosis not present

## 2023-04-05 LAB — CBC
HCT: 26.2 % — ABNORMAL LOW (ref 36.0–46.0)
Hemoglobin: 8.3 g/dL — ABNORMAL LOW (ref 12.0–15.0)
MCH: 27.5 pg (ref 26.0–34.0)
MCHC: 31.7 g/dL (ref 30.0–36.0)
MCV: 86.8 fL (ref 80.0–100.0)
Platelets: 553 10*3/uL — ABNORMAL HIGH (ref 150–400)
RBC: 3.02 MIL/uL — ABNORMAL LOW (ref 3.87–5.11)
RDW: 14.7 % (ref 11.5–15.5)
WBC: 7.9 10*3/uL (ref 4.0–10.5)
nRBC: 0 % (ref 0.0–0.2)

## 2023-04-05 LAB — IRON AND TIBC
Iron: 40 ug/dL (ref 28–170)
Saturation Ratios: 17 % (ref 10.4–31.8)
TIBC: 230 ug/dL — ABNORMAL LOW (ref 250–450)
UIBC: 190 ug/dL

## 2023-04-05 LAB — RETICULOCYTES
Immature Retic Fract: 13.3 % (ref 2.3–15.9)
RBC.: 3.03 MIL/uL — ABNORMAL LOW (ref 3.87–5.11)
Retic Count, Absolute: 39.4 10*3/uL (ref 19.0–186.0)
Retic Ct Pct: 1.3 % (ref 0.4–3.1)

## 2023-04-05 LAB — BASIC METABOLIC PANEL
Anion gap: 11 (ref 5–15)
BUN: 9 mg/dL (ref 6–20)
CO2: 22 mmol/L (ref 22–32)
Calcium: 7.5 mg/dL — ABNORMAL LOW (ref 8.9–10.3)
Chloride: 100 mmol/L (ref 98–111)
Creatinine, Ser: 0.65 mg/dL (ref 0.44–1.00)
GFR, Estimated: >60 mL/min (ref >60)
Glucose, Bld: 87 mg/dL (ref 70–99)
Potassium: 3.7 mmol/L (ref 3.5–5.1)
Sodium: 133 mmol/L — ABNORMAL LOW (ref 135–145)

## 2023-04-05 LAB — C DIFFICILE QUICK SCREEN W PCR REFLEX
C Diff antigen: NEGATIVE
C Diff interpretation: NOT DETECTED
C Diff toxin: NEGATIVE

## 2023-04-05 LAB — VITAMIN B12: Vitamin B-12: 260 pg/mL (ref 180–914)

## 2023-04-05 LAB — MAGNESIUM: Magnesium: 0.6 mg/dL — CL (ref 1.7–2.4)

## 2023-04-05 LAB — OCCULT BLOOD X 1 CARD TO LAB, STOOL: Fecal Occult Bld: POSITIVE — AB

## 2023-04-05 LAB — OSMOLALITY: Osmolality: 279 mosm/kg (ref 275–295)

## 2023-04-05 LAB — FERRITIN: Ferritin: 112 ng/mL (ref 11–307)

## 2023-04-05 LAB — FOLATE: Folate: 11.6 ng/mL (ref >5.9)

## 2023-04-05 MED ORDER — DOXYCYCLINE HYCLATE 100 MG PO TABS
100.0000 mg | ORAL_TABLET | Freq: Two times a day (BID) | ORAL | Status: DC
  Administered 2023-04-05 – 2023-04-10 (×10): 100 mg via ORAL
  Filled 2023-04-05 (×10): qty 1

## 2023-04-05 MED ORDER — MAGNESIUM SULFATE 4 GM/100ML IV SOLN
4.0000 g | Freq: Once | INTRAVENOUS | Status: AC
  Administered 2023-04-05: 4 g via INTRAVENOUS
  Filled 2023-04-05: qty 100

## 2023-04-05 MED ORDER — SODIUM CHLORIDE 0.9 % IV SOLN: INTRAVENOUS | Status: DC

## 2023-04-05 MED ORDER — CEFADROXIL 500 MG PO CAPS
500.0000 mg | ORAL_CAPSULE | Freq: Two times a day (BID) | ORAL | Status: DC
  Administered 2023-04-05 – 2023-04-10 (×10): 500 mg via ORAL
  Filled 2023-04-05 (×13): qty 1

## 2023-04-05 MED ORDER — VANCOMYCIN HCL 750 MG/150ML IV SOLN: 750.0000 mg | Freq: Two times a day (BID) | INTRAVENOUS | Status: DC

## 2023-04-05 MED ORDER — MIDODRINE HCL 5 MG PO TABS
5.0000 mg | ORAL_TABLET | Freq: Three times a day (TID) | ORAL | Status: DC
  Administered 2023-04-05 – 2023-04-10 (×14): 5 mg via ORAL
  Filled 2023-04-05 (×14): qty 1

## 2023-04-05 MED ORDER — ONDANSETRON HCL 4 MG/2ML IJ SOLN
4.0000 mg | Freq: Four times a day (QID) | INTRAMUSCULAR | Status: DC | PRN
  Administered 2023-04-05 – 2023-04-09 (×8): 4 mg via INTRAVENOUS
  Filled 2023-04-05 (×8): qty 2

## 2023-04-05 MED ORDER — PROCHLORPERAZINE EDISYLATE 10 MG/2ML IJ SOLN
10.0000 mg | Freq: Four times a day (QID) | INTRAMUSCULAR | Status: DC | PRN
  Administered 2023-04-05 – 2023-04-10 (×10): 10 mg via INTRAVENOUS
  Filled 2023-04-05 (×10): qty 2

## 2023-04-05 MED ORDER — METRONIDAZOLE 500 MG PO TABS
500.0000 mg | ORAL_TABLET | Freq: Two times a day (BID) | ORAL | Status: DC
  Administered 2023-04-05 – 2023-04-10 (×10): 500 mg via ORAL
  Filled 2023-04-05 (×10): qty 1

## 2023-04-05 NOTE — Plan of Care (Signed)
  Problem: Education: Goal: Knowledge of General Education information will improve Description: Including pain rating scale, medication(s)/side effects and non-pharmacologic comfort measures Outcome: Progressing   Problem: Health Behavior/Discharge Planning: Goal: Ability to manage health-related needs will improve Outcome: Progressing   Problem: Clinical Measurements: Goal: Ability to maintain clinical measurements within normal limits will improve Outcome: Progressing Goal: Will remain free from infection Outcome: Progressing Goal: Diagnostic test results will improve Outcome: Progressing Goal: Respiratory complications will improve Outcome: Progressing Goal: Cardiovascular complication will be avoided Outcome: Progressing   Problem: Activity: Goal: Risk for activity intolerance will decrease Outcome: Progressing   Problem: Nutrition: Goal: Adequate nutrition will be maintained Outcome: Progressing   Problem: Coping: Goal: Level of anxiety will decrease Outcome: Progressing   Problem: Elimination: Goal: Will not experience complications related to bowel motility Outcome: Progressing Goal: Will not experience complications related to urinary retention Outcome: Progressing   Problem: Pain Management: Goal: General experience of comfort will improve Outcome: Progressing   Problem: Safety: Goal: Ability to remain free from injury will improve Outcome: Progressing   Problem: Skin Integrity: Goal: Risk for impaired skin integrity will decrease Outcome: Progressing   Problem: Fluid Volume: Goal: Hemodynamic stability will improve Outcome: Progressing   Problem: Clinical Measurements: Goal: Diagnostic test results will improve Outcome: Progressing Goal: Signs and symptoms of infection will decrease Outcome: Progressing   Problem: Respiratory: Goal: Ability to maintain adequate ventilation will improve Outcome: Progressing

## 2023-04-05 NOTE — Progress Notes (Signed)
PROGRESS NOTE    [name]  WUJ:[id number] DOB: 1970/11/03 DOA: 04/03/2023 PCP: [name], PA-C   Brief Narrative:    [name] is a 52 y.o. female with medical history significant of recent colectomy with Dr. 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   She reports that she was initially in her usual state of health but after arrival to the ED she began having generalized abdominal pain.  She states that her stomach pain is located in the lower portion of her stomach and is fairly diffuse.  She does not recall any alleviating or aggravating factors given that abdominal pain began right after coming to the ED. she was admitted with septic shock, POA with likely intra-abdominal source.  ID consulted for assistance in patient was started on vancomycin and Rocephin and continued on micafungin due to her ongoing fungemia with Candida glabrata.  Assessment & Plan:   Principal Problem:   Septic shock (HCC)  Assessment and Plan:  Septic Shock, possibly related to stoma site infection- POA Fungemia 2/2 C. Glabrata - POA Abdominal pain -Appreciate ID recommendations with plans to continue vancomycin, Rocephin, and Flagyl  -Micafungin to continue through 12/12 with plan for p.o. at that time -Started on oral vancomycin twice daily for secondary prophylaxis while on IV antibiotics given history of C. Difficile -Blood and fungal cultures with no growth noted thus far -She has been weaned off of norepinephrine at this point, but continues to have soft blood pressure readings which  are approximately 90/50 at baseline per patient   Hyponatremia-improving -Check BMP tomorrow -Urine osmolality and sodium levels are low, follow-up serum osmolality -Improved with use of normal saline, continue the same for now  Severe hypomagnesemia -Replete and reevaluate in a.m.  Worsening anemia -Some questionable overt bleeding which will need to be monitored -Anemia panel negative -Continue to follow CBC and transfuse for hemoglobin less than 7 -Hold Eliquis for now   AKI-resolved Cr 2.79 on admission, baseline appears to be around 0.6-0.7. Likely in the setting of septic shock and volume depletion. She has received 3L IVF boluses and I have started her on LR @100cc /hr for 12 hours. Will recheck kidney function tomorrow AM. -Continue on normal saline  Refractory Crohn's disease Recent history of fulminant C. difficile colitis  Status post colectomy (02/2023, Dr. Luisa Hart) Patient has ran out of her colostomy supplies and will need additional supplies at discharge.  It does not appear that she is currently on any medications for her Crohn's disease. -Colostomy supplies -Will need to follow-up with [facility] for further recommendations in regards to Crohns disease -Oral vancomycin per ID -Has high amounts of liquid stool output from ostomy site, check C. difficile and GI pathogen panel   Type 2 diabetes Does not appear to be on any medications for this at home.  Last A1c 5.8% 2 months ago. -Trend morning CBGs, goal 140-180   Hypertension, questionable. -States blood pressures are typically 90/50 at home, should not require antihypertensives -Holding home antihypertensives in the setting of septic shock -Wean norepinephrine as tolerated   History of DVT/PE Noted during recent hospitalization in 02/2023.  She is on Eliquis at home  and will continue during hospitalization. -Hold Eliquis for now due to some concerns for overt bleeding, however this is questionable    Thrombocytosis Noted during prior hospitalization as well.  Evaluated by hematology and felt to be inflammatory.  Studies at that time included BCR-ABL-1, CML/ALL PCR, JAK2 which were all negative.  Will continue to monitor   Hx of Cardiac arrest (01/2023) -telemetry   Seizure disorder -Continue home Keppra and gabapentin   GERD -Continue home PPI   COPD Not in exacerbation. -Continue LABA/ICS   Anxiety Patient is on Xanax 1 mg 3 times daily as needed and triazolam 0.25 mg daily.  Reviewed PDMP, she is appropriately obtaining Xanax and triazolam.  Will continue these medications while here at slightly reduced doses. -Reduced Xanax to 0.5 mg 2 times daily as needed -Continue home triazolam 0.25 mg daily    DVT prophylaxis:Eliquis to SCDs for now Code Status: Full Family Communication: None at bedside Disposition Plan:  Status is: Inpatient Remains inpatient appropriate because: Need for IV medications.   Consultants:  ID  Procedures:  None  Antimicrobials:  Anti-infectives (From admission, onward)    Start     Dose/Rate Route Frequency Ordered Stop   04/04/23 1400  vancomycin (VANCOREADY) IVPB 750 mg/150 mL        750 mg 150 mL/hr over 60 Minutes Intravenous Every 24 hours 04/04/23 1113     04/04/23 1300  vancomycin (VANCOCIN) capsule 125 mg        125 mg Oral 2 times daily 04/04/23 1203     04/04/23 1200  cefTRIAXone (ROCEPHIN) 2 g in sodium chloride 0.9 % 100 mL IVPB        2 g 200 mL/hr over 30 Minutes Intravenous Every 24 hours 04/03/23 1705     04/04/23 1000  ceFEPIme (MAXIPIME) 2 g in sodium chloride 0.9 % 100 mL IVPB  Status:  Discontinued        2 g 200 mL/hr over 30 Minutes Intravenous Every 24 hours 04/03/23 1323 04/03/23 1705   04/04/23 1000  micafungin (MYCAMINE) 150 mg in sodium chloride 0.9 % 100 mL IVPB        150 mg 107.5 mL/hr over 1 Hours Intravenous Every 24 hours 04/03/23 1704     04/03/23 2300  metroNIDAZOLE (FLAGYL) IVPB 500 mg  Status:   Discontinued        500 mg 100 mL/hr over 60 Minutes Intravenous Every 12 hours 04/03/23 1635 04/03/23 1704   04/03/23 1600  vancomycin (VANCOREADY) IVPB 500 mg/100 mL        500 mg 100 mL/hr over 60 Minutes Intravenous  Once 04/03/23 1322 04/03/23 2031   04/03/23 1323  vancomycin variable dose per unstable renal function (pharmacist dosing)  Status:  Discontinued         Does not apply See admin instructions 04/03/23 1323 04/05/23 1123   04/03/23 1200  ceFEPIme (MAXIPIME) 2 g in sodium chloride 0.9 % 100 mL IVPB        2 g 200 mL/hr over 30 Minutes Intravenous  Once 04/03/23 1152 04/03/23 1231   04/03/23 1200  metroNIDAZOLE (FLAGYL) IVPB 500 mg        500 mg 100 mL/hr over 60 Minutes Intravenous  Once 04/03/23 1152 04/03/23 1311   04/03/23 1200  vancomycin (VANCOCIN) IVPB 1000 mg/200 mL premix        1,000 mg 200 mL/hr over 60 Minutes Intravenous  Once 04/03/23 1152 04/03/23 1326      Subjective: Patient  seen and evaluated today with some concerns of rectal bleeding noted, however this was not seen by nursing staff.  She denies any other pain complaints and states that she otherwise continues to feel weak.  Objective: Vitals:   04/05/23 0800 04/05/23 0824 04/05/23 1045 04/05/23 1159  BP: 93/65  (!) 121/57   Pulse: (!) 123 (!) 119 (!) 123   Resp: 19 15 16    Temp: 97.8 F (36.6 C)   97.8 F (36.6 C)  TempSrc: Oral   Axillary  SpO2: 100% 96% 100%   Weight:      Height:        Intake/Output Summary (Last 24 hours) at 04/05/2023 1237 Last data filed at 04/05/2023 1116 Gross per 24 hour  Intake 1541.56 ml  Output 1300 ml  Net 241.56 ml   Filed Weights   04/03/23 1115 04/04/23 0545  Weight: 59 kg 58.8 kg    Examination:  General exam: Appears calm and comfortable  Respiratory system: Clear to auscultation. Respiratory effort normal. Cardiovascular system: S1 & S2 heard, RRR.  Gastrointestinal system: Abdomen is soft, ostomy present with liquidy stools Central nervous  system: Alert and awake Extremities: No edema Skin: No significant lesions noted, PICC line to left chest wall without any erythema or drainage Psychiatry: Flat affect.    Data Reviewed: I have personally reviewed following labs and imaging studies  CBC: Recent Labs  Lab 04/03/23 1130 04/04/23 0312 04/05/23 0421  WBC 12.2* 10.9* 7.9  NEUTROABS 8.7*  --   --   HGB 13.3 9.4* 8.3*  HCT 39.0 28.1* 26.2*  MCV 82.1 83.1 86.8  PLT 834* 683* 553*   Basic Metabolic Panel: Recent Labs  Lab 04/03/23 1130 04/04/23 0312 04/05/23 0421  NA 123* 129* 133*  K 3.9 3.3* 3.7  CL 79* 94* 100  CO2 26 22 22   GLUCOSE 104* 115* 87  BUN 34* 23* 9  CREATININE 2.79* 1.38* 0.65  CALCIUM 8.9 7.6* 7.5*  MG  --   --  0.6*   GFR: Estimated Creatinine Clearance: 66.5 mL/min (by C-G formula based on SCr of 0.65 mg/dL). Liver Function Tests: Recent Labs  Lab 04/03/23 1130 04/04/23 0312  AST 47* 25  ALT 35 18  ALKPHOS 212* 118  BILITOT 0.5 0.5  PROT 8.8* 5.2*  ALBUMIN 3.8 2.3*   No results for input(s): "LIPASE", "AMYLASE" in the last 168 hours. No results for input(s): "AMMONIA" in the last 168 hours. Coagulation Profile: Recent Labs  Lab 04/03/23 1130 04/04/23 0312  INR 1.5* 1.3*   Cardiac Enzymes: No results for input(s): "CKTOTAL", "CKMB", "CKMBINDEX", "TROPONINI" in the last 168 hours. BNP (last 3 results) No results for input(s): "PROBNP" in the last 8760 hours. HbA1C: No results for input(s): "HGBA1C" in the last 72 hours. CBG: No results for input(s): "GLUCAP" in the last 168 hours. Lipid Profile: No results for input(s): "CHOL", "HDL", "LDLCALC", "TRIG", "CHOLHDL", "LDLDIRECT" in the last 72 hours. Thyroid Function Tests: No results for input(s): "TSH", "T4TOTAL", "FREET4", "T3FREE", "THYROIDAB" in the last 72 hours. Anemia Panel: Recent Labs    04/05/23 0421  VITAMINB12 260  FOLATE 11.6  FERRITIN 112  TIBC 230*  IRON 40  RETICCTPCT 1.3   Sepsis Labs: Recent  Labs  Lab 04/03/23 1950 04/03/23 2334 04/04/23 0312 04/04/23 0619  LATICACIDVEN 2.3* 2.7* 1.9 1.9    Recent Results (from the past 240 hour(s))  Blood Culture (routine x 2)     Status: None (Preliminary result)   Collection Time:  04/03/23 11:30 AM   Specimen: BLOOD RIGHT ARM  Result Value Ref Range Status   Specimen Description   Final    BLOOD RIGHT ARM BOTTLES DRAWN AEROBIC AND ANAEROBIC   Special Requests Blood Culture adequate volume  Final   Culture   Final    NO GROWTH 2 DAYS Performed at [facility], [address], [place], [place] [address]    Report Status PENDING  Incomplete  Blood Culture (routine x 2)     Status: None (Preliminary result)   Collection Time: 04/03/23 11:35 AM   Specimen: BLOOD LEFT ARM  Result Value Ref Range Status   Specimen Description BLOOD LEFT ARM BOTTLES DRAWN AEROBIC AND ANAEROBIC  Final   Special Requests Blood Culture adequate volume  Final   Culture   Final    NO GROWTH 2 DAYS Performed at [facility], [address], [place], [place] [address]    Report Status PENDING  Incomplete  MRSA Next Gen by PCR, Nasal     Status: None   Collection Time: 04/03/23  1:57 PM   Specimen: Nasal Mucosa; Nasal Swab  Result Value Ref Range Status   MRSA by PCR Next Gen NOT DETECTED NOT DETECTED Final    Comment: (NOTE) The GeneXpert MRSA Assay (FDA approved for NASAL specimens only), is one component of a comprehensive MRSA colonization surveillance program. It is not intended to diagnose MRSA infection nor to guide or monitor treatment for MRSA infections. Test performance is not FDA approved in patients less than 75 years old. Performed at [facility], [address] [place], [place] [address]   Fungus culture, blood     Status: None (Preliminary result)   Collection Time: 04/03/23  5:49 PM   Specimen: BLOOD RIGHT HAND  Result Value Ref Range Status   Specimen Description BLOOD RIGHT HAND  Final   Special Requests   Final    BOTTLES DRAWN  AEROBIC ONLY Blood Culture results may not be optimal due to an inadequate volume of blood received in culture bottles   Culture   Final    NO GROWTH 2 DAYS Performed at [facility], [address], [place], [place] [address]    Report Status PENDING  Incomplete         Radiology Studies: CT ABDOMEN PELVIS WO CONTRAST  Result Date: 04/03/2023 CLINICAL DATA:  Abdominal pain.  Hypotension. EXAM: CT ABDOMEN AND PELVIS WITHOUT CONTRAST TECHNIQUE: Multidetector CT imaging of the abdomen and pelvis was performed following the standard protocol without IV contrast. RADIATION DOSE REDUCTION: This exam was performed according to the departmental dose-optimization program which includes automated exposure control, adjustment of the mA and/or kV according to patient size and/or use of iterative reconstruction technique. COMPARISON:  CT 02/25/2023. FINDINGS: Lower chest: Improved left pleural effusion compared to previous. Improving left lung opacity. There is some mild opacity residual in the lingula dependently. There is also some opacity along the anterior aspect of the right lower lobe. A focal infiltrates possible. Recommend follow-up. Breathing motion. Heart is slightly enlarged. Trace pericardial fluid. Hepatobiliary: Heterogeneous liver with some fatty infiltration. Previous cholecystectomy. Pancreas: Unremarkable. No pancreatic ductal dilatation or surrounding inflammatory changes. Spleen: Normal in size without focal abnormality. Adrenals/Urinary Tract: Stable slight thickening of the left adrenal gland. Right adrenal gland is preserved. No abnormal calcifications are seen within either kidney nor along the course of either ureter. Preserved contours of the distended urinary bladder there is persistent wall thickening of the bladder particularly superiorly as seen  on coronal series 5, image 49 please correlate for any known history or further workup when appropriate Stomach/Bowel: Right lower quadrant  ileostomy identified. On this non oral contrast exam the stomach and small bowel are nondilated. Large bowel is relatively decompressed as well. The previous drain is no longer identified. Areas of stranding along the mesentery on left side are decreasing with significant residual. No free air or free fluid. Vascular/Lymphatic: Normal caliber aorta and IVC. Minimal vascular calcifications no discrete abnormal lymph node enlargement identified in the abdomen and pelvis. Previous small node aortocaval measuring 8 mm in short axis on the prior, today measures 6 mm on series 2 image 34. Reproductive: Status post hysterectomy. No adnexal masses. Other: Please see above Musculoskeletal: Curvature of the spine. Scattered degenerative changes. IMPRESSION: Decreasing mesenteric stranding, fluid with some residual. Previous drain is also no longer present. Right lower quadrant ostomy. No bowel obstruction.  The bowel overall is relatively decompressed. No obstructing renal stone. Improving pleural effusions and lung opacities with some residual opacity in the right lower lobe and lingula. Please correlate for signs of infiltrative recommend continued follow-up. Electronically Signed   By: Karen Kays M.D.   On: 04/03/2023 13:17        Scheduled Meds:  amitriptyline  100 mg Oral QHS   apixaban  5 mg Oral BID   Chlorhexidine Gluconate Cloth  6 each Topical Daily   fentaNYL  1 patch Transdermal Q72H   fluticasone furoate-vilanterol  1 puff Inhalation Daily   gabapentin  300 mg Oral BID   levETIRAcetam  750 mg Oral BID   pantoprazole  40 mg Oral Daily   sodium chloride flush  10 mL Intravenous Q12H   vancomycin  125 mg Oral BID   Continuous Infusions:  sodium chloride 75 mL/hr at 04/04/23 1915   cefTRIAXone (ROCEPHIN)  IV 2 g (04/05/23 1102)   micafungin (MYCAMINE) 150 mg in sodium chloride 0.9 % 100 mL IVPB 150 mg (04/05/23 1015)   norepinephrine (LEVOPHED) Adult infusion Stopped (04/04/23 1250)    vancomycin 150 mL/hr at 04/04/23 1602     LOS: 2 days    Total care time: 55 minutes    Tacuma Graffam D Sherryll Burger, DO [facility]  If 7PM-7AM, please contact night-coverage [email or web address] 04/05/2023, 12:37 PM

## 2023-04-05 NOTE — Progress Notes (Signed)
Since beginning of my shift, ostomy has been changed x2 d/t leaking.

## 2023-04-05 NOTE — Consult Note (Signed)
WOC team in to visit with patient. Small [place] pouch intact and hooked to bedside drainage bag with large amount of green effluent in bag.  I did crust exposed broken down skin with no sting barrier wipe and stoma powder and educated her again on how to do this.   I placed a typed out sheet with instructions on how to order ostomy supplies in bag with ostomy supplies we are sending home.  There are 3 options for ordering supplies moving forward which I outlined for patient:  1. Through [employer] (number given)  2.  Through [employer] - all information on how to order including item numbers. 3.  Through ostomy clinic - handout given and reviewed.  I encouraged patient that ostomy clinic would be very helpful in assisting her with future pouching needs.  Patient states she does not have transportation.    Both [employer] and [facility] would only require a phone call with her insurance information to move forward with ordering a months worth of supplies at a time.  I have typed out surgeons name and phone number, size and type of ostomy and what pouching supplies she needs to order.  Again discussed with patient that not ordering supplies then coming to Emergency Room for leaking and no supplies is not a good option.    WOC team remains available for ostomy support.  Please re-consult if  further needs arise.   Thank you,    Priscella Mann MSN, RN-BC, [employer] [phone]

## 2023-04-06 DIAGNOSIS — K625 Hemorrhage of anus and rectum: Secondary | ICD-10-CM | POA: Diagnosis not present

## 2023-04-06 DIAGNOSIS — R6521 Severe sepsis with septic shock: Secondary | ICD-10-CM | POA: Diagnosis not present

## 2023-04-06 DIAGNOSIS — A419 Sepsis, unspecified organism: Secondary | ICD-10-CM | POA: Diagnosis not present

## 2023-04-06 LAB — BASIC METABOLIC PANEL
Anion gap: 8 (ref 5–15)
BUN: <5 mg/dL — ABNORMAL LOW (ref 6–20)
CO2: 24 mmol/L (ref 22–32)
Calcium: 7.5 mg/dL — ABNORMAL LOW (ref 8.9–10.3)
Chloride: 100 mmol/L (ref 98–111)
Creatinine, Ser: 0.56 mg/dL (ref 0.44–1.00)
GFR, Estimated: >60 mL/min (ref >60)
Glucose, Bld: 91 mg/dL (ref 70–99)
Potassium: 3.3 mmol/L — ABNORMAL LOW (ref 3.5–5.1)
Sodium: 132 mmol/L — ABNORMAL LOW (ref 135–145)

## 2023-04-06 LAB — CBC
HCT: 25.9 % — ABNORMAL LOW (ref 36.0–46.0)
Hemoglobin: 8 g/dL — ABNORMAL LOW (ref 12.0–15.0)
MCH: 26.8 pg (ref 26.0–34.0)
MCHC: 30.9 g/dL (ref 30.0–36.0)
MCV: 86.6 fL (ref 80.0–100.0)
Platelets: 535 10*3/uL — ABNORMAL HIGH (ref 150–400)
RBC: 2.99 MIL/uL — ABNORMAL LOW (ref 3.87–5.11)
RDW: 14.7 % (ref 11.5–15.5)
WBC: 7.4 10*3/uL (ref 4.0–10.5)
nRBC: 0 % (ref 0.0–0.2)

## 2023-04-06 LAB — GASTROINTESTINAL PANEL BY PCR, STOOL (REPLACES STOOL CULTURE)

## 2023-04-06 LAB — MAGNESIUM: Magnesium: 1.3 mg/dL — ABNORMAL LOW (ref 1.7–2.4)

## 2023-04-06 MED ORDER — FOLIC ACID 1 MG PO TABS
1.0000 mg | ORAL_TABLET | Freq: Every day | ORAL | Status: DC
  Administered 2023-04-06 – 2023-04-10 (×5): 1 mg via ORAL
  Filled 2023-04-06 (×5): qty 1

## 2023-04-06 MED ORDER — MAGNESIUM SULFATE 4 GM/100ML IV SOLN
4.0000 g | Freq: Once | INTRAVENOUS | Status: AC
  Administered 2023-04-06: 4 g via INTRAVENOUS
  Filled 2023-04-06: qty 100

## 2023-04-06 MED ORDER — LORATADINE 10 MG PO TABS
10.0000 mg | ORAL_TABLET | Freq: Every day | ORAL | Status: DC
  Administered 2023-04-06 – 2023-04-10 (×5): 10 mg via ORAL
  Filled 2023-04-06 (×5): qty 1

## 2023-04-06 MED ORDER — DIPHENOXYLATE-ATROPINE 2.5-0.025 MG PO TABS
1.0000 | ORAL_TABLET | Freq: Four times a day (QID) | ORAL | Status: DC
  Administered 2023-04-06 – 2023-04-10 (×16): 1 via ORAL
  Filled 2023-04-06 (×16): qty 1

## 2023-04-06 MED ORDER — CYCLOSPORINE 0.05 % OP EMUL
1.0000 [drp] | Freq: Two times a day (BID) | OPHTHALMIC | Status: DC
Start: 2023-04-06 — End: 2023-04-10
  Administered 2023-04-06 – 2023-04-10 (×9): 1 [drp] via OPHTHALMIC
  Filled 2023-04-06 (×9): qty 30

## 2023-04-06 MED ORDER — CYCLOBENZAPRINE HCL 10 MG PO TABS
5.0000 mg | ORAL_TABLET | Freq: Three times a day (TID) | ORAL | Status: DC | PRN
  Administered 2023-04-06 – 2023-04-09 (×5): 5 mg via ORAL
  Filled 2023-04-06 (×5): qty 1

## 2023-04-06 MED ORDER — ENSURE ENLIVE PO LIQD
237.0000 mL | Freq: Two times a day (BID) | ORAL | Status: DC
  Administered 2023-04-06: 237 mL via ORAL

## 2023-04-06 MED ORDER — CALCIUM POLYCARBOPHIL 625 MG PO TABS
1250.0000 mg | ORAL_TABLET | Freq: Two times a day (BID) | ORAL | Status: DC
  Administered 2023-04-06 – 2023-04-10 (×8): 1250 mg via ORAL
  Filled 2023-04-06 (×13): qty 2

## 2023-04-06 MED ORDER — METOPROLOL TARTRATE 25 MG PO TABS: 25.0000 mg | ORAL_TABLET | Freq: Two times a day (BID) | ORAL | Status: DC

## 2023-04-06 MED ORDER — STERILE WATER FOR INJECTION IJ SOLN
INTRAMUSCULAR | Status: AC
  Administered 2023-04-06: 2 mL
  Filled 2023-04-06: qty 10

## 2023-04-06 MED ORDER — THIAMINE MONONITRATE 100 MG PO TABS
100.0000 mg | ORAL_TABLET | Freq: Every day | ORAL | Status: DC
  Administered 2023-04-06 – 2023-04-10 (×5): 100 mg via ORAL
  Filled 2023-04-06 (×4): qty 1

## 2023-04-06 MED ORDER — METOPROLOL TARTRATE 25 MG PO TABS
12.5000 mg | ORAL_TABLET | Freq: Two times a day (BID) | ORAL | Status: DC
  Administered 2023-04-06 – 2023-04-10 (×8): 12.5 mg via ORAL
  Filled 2023-04-06 (×8): qty 1

## 2023-04-06 MED ORDER — ALTEPLASE 2 MG IJ SOLR
2.0000 mg | Freq: Once | INTRAMUSCULAR | Status: AC | PRN
  Administered 2023-04-06: 2 mg
  Filled 2023-04-06 (×2): qty 2

## 2023-04-06 MED ORDER — VITAMIN D (ERGOCALCIFEROL) 1.25 MG (50000 UNIT) PO CAPS
50000.0000 [IU] | ORAL_CAPSULE | ORAL | Status: DC
  Administered 2023-04-06: 50000 [IU] via ORAL
  Filled 2023-04-06: qty 1

## 2023-04-06 NOTE — H&P (View-Only) (Signed)
 Gastroenterology Consult   Referring Provider: No ref. provider found Primary Care Physician:  [name], PA-C Primary Gastroenterologist:  Dr. Eldridge Dace ([facility])  Patient ID: [name]; [id number]; 1971/04/05   Admit date: 04/03/2023  LOS: 3 days   Date of Consultation: 04/06/2023  Reason for Consultation:  rectal bleeding on Eliquis  History of Present Illness   [name] is a 52 y.o. year old female with medical history significant for inflammatory, stricturing Crohn's disease, history of C diff colitis, seizures, COPD, T2DM, pancreatitis and recent Colectomy on 02/11/23 secondary to refractory Crohn's disease and necrosis of the bowel due to fulminant C diff colitis, with PICC line infection with C. Glabrata, currently on Micafungin, who presented to the hospital on 12/1 with concern for not having colostomy supplies at home, reported some abdominal pain over the past few days as well as inability to flush one of her PICC line ports. Also found to be in septic shock, with hypotenstion, leukocytosis, started on flagyl, cefipime and vanc as well as fluid resuscitation, yellow drainage noted from stoma site. Patient also with questionable GI bleeding, GI consulted for further evaluation.   Hospital Course: CT A/P wo contrast on 12/1  Decreasing mesenteric stranding, fluid with some residual. Previous drain is also no longer present. Right lower quadrant ostomy. No bowel obstruction.  The bowel overall is relatively decompressed. Improving pleural effusions and lung opacities with some residual opacity in the right lower lobe and lingula. Please correlate for signs of infiltrative recommend continued follow-up.  Hgb down to 8 from 13.3 on 12/1. C diff negative FOBT positive GI path panel pending Iron 40, TIBC 230, sat 17, ferritin 112, folate  11.6, b 12 260  Consult: Pertinent history: diagnosed with CD in 1998 and has had severe disease activity with  colonic inflammation, rectal stricture, abdominal pain, and diarrhea since 2022. hospitalized multiple times at many institutions for her CD.  Her major CD sequelae has been a rectal stricture measuring 6mm as of August 2023. Jan 2024 she remained w/severe abd pain, diarrhea despite oral entocort- planned for topical steroid foam and initiation with Skyrizi. She also tried a course of oral steroids without sx change. Pt was approved for [employer], but pt did not complete induction infusions. Seen at [facility] in June and again recommended to start skyrizi which appears to have been started in July   Patient was admitted to [facility] in September due to septic shock/PEA arrest/lactic acidosis, also with CGE and melena. She underwent flex sig 10/2 which showed severe colitis. No c diff on PCR, started on oral vanc. CRP 13.4. she developed an ileus thereafter, underwent flex sig again on 10/10 with findings of diffuse severe inflammation, pseudomembranous colitis, possible ischemia. Underwent exploratory lapartomy with total colectomy on 10/11 due to findings of necrotic bowel.  Today, patient states that she had been having issues with her ostomy bag staying on, had come to the ED x2 for assistance with this and reports she was admitted on her 3rd visit. Since admission, she has noted BRB clots that appear to be coming from her rectum anytime she sits down on the toilet. She has also noted a lot of blood on toilet tissue. Feels that bleeding has become heavier since it began. She does not a sensation in rectal area of pressure like she is constipated. Noted this right after her colectomy as well. No blood noted in ostomy bag.   She notes abdomen is very sore and she has a lot  of abdominal cramping as well.   She began having nausea and vomiting yesterday. But seems to be tolerating food okay today as nausea medication seems to be providing relief of her symptoms.     Prior to Admission medications   Medication Sig  Start Date End Date Taking? Authorizing Provider  acetaminophen (TYLENOL) 325 MG tablet Take 2 tablets (650 mg total) by mouth every 6 (six) hours as needed for mild pain or headache (or Fever >/= 101). 01/03/22  Yes Vassie Loll, MD  ALPRAZolam Prudy Feeler) 1 MG tablet Take 1 mg by mouth 3 (three) times daily as needed for anxiety. 07/11/19  Yes [provider]  amitriptyline (ELAVIL) 100 MG tablet Take 100 mg by mouth at bedtime.   Yes [provider]  apixaban (ELIQUIS) 5 MG TABS tablet Take 1 tablet (5 mg total) by mouth 2 (two) times daily. 03/19/23 04/18/23 Yes Lanae Boast, MD  cetirizine (ZYRTEC) 10 MG tablet Take 10 mg by mouth daily. 01/08/22  Yes [provider]  clotrimazole-betamethasone (LOTRISONE) cream SMARTSIG:Sparingly Topical Twice Daily PRN   Yes [provider]  cyclobenzaprine (FLEXERIL) 10 MG tablet Take 10 mg by mouth 3 (three) times daily as needed.   Yes [provider]  diphenoxylate-atropine (LOMOTIL) 2.5-0.025 MG tablet Take 1 tablet by mouth 4 (four) times daily. 03/19/23  Yes Lanae Boast, MD  DULoxetine (CYMBALTA) 30 MG capsule Take 30 mg by mouth daily. 12/17/21  Yes [provider]  feeding supplement (ENSURE ENLIVE / ENSURE PLUS) LIQD Take 237 mLs by mouth 2 (two) times daily between meals. 01/04/22  Yes Vassie Loll, MD  fentaNYL (DURAGESIC) 25 MCG/HR Place 1 patch onto the skin every 3 (three) days. 03/21/23  Yes [provider]  folic acid (FOLVITE) 1 MG tablet Take 1 tablet (1 mg total) by mouth daily. 10/03/21  Yes Tat, Onalee Hua, MD  gabapentin (NEURONTIN) 300 MG capsule TAKE (1) CAPSULE BY MOUTH TWICE DAILY. 10/15/22  Yes [provider]  hydrOXYzine (ATARAX) 50 MG tablet Take 25-50 mg by mouth 3 (three) times daily as needed.   Yes [provider]  ivabradine (CORLANOR) 5 MG TABS tablet Take 1 tablet (5 mg total) by mouth 2 (two) times daily with a meal. 03/19/23 04/18/23 Yes Kc, Dayna Barker, MD   levETIRAcetam (KEPPRA) 750 MG tablet Take 750 mg by mouth 2 (two) times daily. 09/30/20  Yes [provider]  loperamide (IMODIUM) 2 MG capsule Take 2 capsules (4 mg total) by mouth 4 (four) times daily. 03/19/23  Yes Lanae Boast, MD  metoprolol tartrate (LOPRESSOR) 25 MG tablet Take 25 mg by mouth 2 (two) times daily. 03/19/23  Yes [provider]  micafungin (MYCAMINE) 100 MG SOLR injection Inject 50 mg into the vein. 03/30/23  Yes [provider]  nitroGLYCERIN (NITROSTAT) 0.4 MG SL tablet Place 1 tablet (0.4 mg total) under the tongue every 5 (five) minutes as needed for chest pain. 03/19/21  Yes Rehman, Joline Maxcy, MD  ondansetron (ZOFRAN-ODT) 4 MG disintegrating tablet Take 4 mg by mouth every 8 (eight) hours as needed for nausea or vomiting. 09/07/21  Yes [provider]  pantoprazole (PROTONIX) 40 MG tablet Take 1 tablet (40 mg total) by mouth 2 (two) times daily. 01/03/22  Yes Vassie Loll, MD  RESTASIS 0.05 % ophthalmic emulsion Place 1 drop into both eyes 2 (two) times daily. 12/12/20  Yes [provider]  SPIRIVA RESPIMAT 2.5 MCG/ACT AERS Inhale 1 puff into the lungs daily. 01/03/22  Yes  Vassie Loll, MD  thiamine (VITAMIN B1) 100 MG tablet Take 1 tablet (100 mg total) by mouth daily. 03/20/23 04/19/23 Yes Kc, Dayna Barker, MD  triazolam (HALCION) 0.25 MG tablet Take 0.25 mg by mouth daily.   Yes [provider]  vancomycin (VANCOCIN) 125 MG capsule Take 1 capsule (125 mg total) by mouth every other day for 8 days, THEN 1 capsule (125 mg total) every 3 (three) days for 9 days. 03/20/23 04/06/23 Yes Lanae Boast, MD  Vitamin D, Ergocalciferol, (DRISDOL) 1.25 MG (50000 UNIT) CAPS capsule Take 50,000 Units by mouth once a week. Takes on Wednesdays. 12/14/21  Yes [provider]    Current Facility-Administered Medications  Medication Dose Route Frequency Provider Last Rate Last Admin   acetaminophen (TYLENOL) tablet 650 mg  650 mg Oral Q6H PRN  Briscoe Burns, MD   650 mg at 04/05/23 2249   Or   acetaminophen (TYLENOL) suppository 650 mg  650 mg Rectal Q6H PRN Briscoe Burns, MD       ALPRAZolam Prudy Feeler) tablet 0.5 mg  0.5 mg Oral BID PRN Sherryll Burger, Pratik D, DO   0.5 mg at 04/05/23 2235   amitriptyline (ELAVIL) tablet 100 mg  100 mg Oral QHS Briscoe Burns, MD   100 mg at 04/05/23 2235   cefadroxil (DURICEF) capsule 500 mg  500 mg Oral BID Danelle Earthly, MD   500 mg at 04/06/23 1884   Chlorhexidine Gluconate Cloth 2 % PADS 6 each  6 each Topical Daily Briscoe Burns, MD   6 each at 04/06/23 1660   doxycycline (VIBRA-TABS) tablet 100 mg  100 mg Oral Q12H Danelle Earthly, MD   100 mg at 04/06/23 0921   fentaNYL (DURAGESIC) 25 MCG/HR 1 patch  1 patch Transdermal Q72H Briscoe Burns, MD   1 patch at 04/03/23 2115   fluticasone furoate-vilanterol (BREO ELLIPTA) 100-25 MCG/ACT 1 puff  1 puff Inhalation Daily Briscoe Burns, MD   1 puff at 04/06/23 0837   gabapentin (NEURONTIN) capsule 300 mg  300 mg Oral BID Briscoe Burns, MD   300 mg at 04/06/23 6301   HYDROmorphone (DILAUDID) injection 0.5 mg  0.5 mg Intravenous Q4H PRN Maurilio Lovely D, DO   0.5 mg at 04/05/23 1758   levETIRAcetam (KEPPRA) tablet 750 mg  750 mg Oral BID Briscoe Burns, MD   750 mg at 04/06/23 6010   magnesium sulfate IVPB 4 g 100 mL  4 g Intravenous Once Laural Benes, Clanford L, MD 50 mL/hr at 04/06/23 0924 4 g at 04/06/23 0924   metroNIDAZOLE (FLAGYL) tablet 500 mg  500 mg Oral Q12H Danelle Earthly, MD   500 mg at 04/06/23 9323   micafungin (MYCAMINE) 150 mg in sodium chloride 0.9 % 100 mL IVPB  150 mg Intravenous Q24H Briscoe Burns, MD   Stopped at 04/05/23 1121   midodrine (PROAMATINE) tablet 5 mg  5 mg Oral TID WC [name], Pratik D, DO   5 mg at 04/06/23 5573   norepinephrine (LEVOPHED) 4mg  in (0.016 mg/mL) premix infusion  2-10 mcg/min Intravenous Titrated Rondel Baton, MD   Stopped at 04/04/23 1250   ondansetron (ZOFRAN) injection 4 mg  4 mg  Intravenous Q6H PRN Maurilio Lovely D, DO   4 mg at 04/05/23 1935   Oral care mouth rinse  15 mL Mouth Rinse PRN Briscoe Burns, MD       pantoprazole (PROTONIX) EC tablet 40 mg  40 mg Oral Daily Jinwala,  Dimas Alexandria, MD   40 mg at 04/06/23 1610   prochlorperazine (COMPAZINE) injection 10 mg  10 mg Intravenous Q6H PRN Adefeso, Oladapo, DO   10 mg at 04/05/23 2353   sodium chloride flush (NS) 0.9 % injection 10 mL  10 mL Intravenous Q12H Arabella Merles, PA-C   10 mL at 04/06/23 9604   vancomycin (VANCOCIN) capsule 125 mg  125 mg Oral BID Danelle Earthly, MD   125 mg at 04/06/23 5409    Allergies as of 04/03/2023 - Review Complete 04/03/2023  Allergen Reaction Noted   Codeine Shortness Of Breath, Swelling, and Rash    Hydrocodone Shortness Of Breath, Swelling, and Rash 12/26/2012   Ketorolac Nausea And Vomiting, Swelling, and Nausea Only 06/18/2022   Morphine and codeine Shortness Of Breath, Swelling, and Rash 12/26/2012   Penicillins Shortness Of Breath, Swelling, and Other (See Comments)    Nickel Rash 11/19/2021   Pregabalin Other (See Comments) 06/18/2021   Acetaminophen  12/26/2012   Atarax [hydroxyzine] Itching and Swelling 02/23/2023   Ativan [lorazepam] Other (See Comments) 12/20/2016   Oxycodone-acetaminophen Nausea Only and Nausea And Vomiting 03/22/2023   Strawberry extract Swelling 07/22/2011   Watermelon concentrate Swelling 07/22/2011   Albuterol Palpitations 07/08/2021   Benadryl [diphenhydramine hcl] Palpitations 09/23/2011   Humira [adalimumab] Rash 12/24/2021   Latex Rash    Remicade [infliximab] Rash 07/08/2021   Tramadol Rash 01/12/2022    Family History  Problem Relation Age of Onset   Cancer Mother    Heart failure Mother    Hypertension Mother    Heart attack Mother    Heart attack Father 17   Cancer Father 89   Heart failure Father    Diabetes Father    Crohn's disease Cousin     Review of Systems   Gen: Denies any fever, chills, loss of appetite,  change in weight or weight loss CV: Denies chest pain, heart palpitations, syncope, edema  Resp: Denies shortness of breath with rest, cough, wheezing, coughing up blood, and pleurisy. GI: Denies vomiting blood, jaundice, and fecal incontinence.   Denies dysphagia or odynophagia. GU : Denies urinary burning, blood in urine, urinary frequency, and urinary incontinence. MS: Denies joint pain, limitation of movement, swelling, cramps, and atrophy.  Derm: Denies rash, itching, dry skin, hives. Psych: Denies depression, anxiety, memory loss, hallucinations, and confusion. Heme: Denies bruising or bleeding Neuro:  Denies any headaches, dizziness, paresthesias, shaking  Physical Exam   Vital Signs in last 24 hours: Temp:  [97.7 F (36.5 C)-98.2 F (36.8 C)] 98.1 F (36.7 C) (12/04 0800) Pulse Rate:  [115-127] 119 (12/04 0700) Resp:  [11-25] 23 (12/04 0700) BP: (83-133)/(28-84) 118/58 (12/04 0700) SpO2:  [78 %-100 %] 100 % (12/04 0840) Last BM Date : 04/05/23  General:   Alert,  Well-developed, well-nourished, pleasant and cooperative in NAD Head:  Normocephalic and atraumatic. Eyes:  Sclera clear, no icterus.   Conjunctiva pink. Ears:  Normal auditory acuity. Mouth:  No deformity or lesions, dentition normal. Lungs:  Clear throughout to auscultation.   No wheezes, crackles, or rhonchi. No acute distress. Heart:  Regular rate and rhythm; no murmurs, clicks, rubs,  or gallops. Abdomen:  Soft, TTP of diffuse abdomen, non distended. Ostomy bag to RLQ with liquid brown/yellow stool. Stoma appears healthy looking from what I can see beyond the stool.  No masses, hepatosplenomegaly or hernias noted. Normal bowel sounds, without guarding, and without rebound.   Rectal: no obvious external or internal lesions noted on exam, pt  reported a lot of discomfort on DRE with sphincter tone somewhat hypertonic.    Msk:  Symmetrical without gross deformities. Normal posture. Extremities:  Without clubbing or  edema. Neurologic:  Alert and  oriented x4. Skin:  Intact without significant lesions or rashes. Psych:  Alert and cooperative. Normal mood and affect.   Labs/Studies   Recent Labs Recent Labs    04/04/23 0312 04/05/23 0421 04/06/23 0448  WBC 10.9* 7.9 7.4  HGB 9.4* 8.3* 8.0*  HCT 28.1* 26.2* 25.9*  PLT 683* 553* 535*   BMET Recent Labs    04/04/23 0312 04/05/23 0421 04/06/23 0448  NA 129* 133* 132*  K 3.3* 3.7 3.3*  CL 94* 100 100  CO2 22 22 24   GLUCOSE 115* 87 91  BUN 23* 9 <5*  CREATININE 1.38* 0.65 0.56  CALCIUM 7.6* 7.5* 7.5*   LFT Recent Labs    04/03/23 1130 04/04/23 0312  PROT 8.8* 5.2*  ALBUMIN 3.8 2.3*  AST 47* 25  ALT 35 18  ALKPHOS 212* 118  BILITOT 0.5 0.5   PT/INR Recent Labs    04/03/23 1130 04/04/23 0312  LABPROT 18.1* 16.7*  INR 1.5* 1.3*   C-Diff Recent Labs    04/05/23 1300  CDIFFTOX NEGATIVE     Assessment   Ariam Leifheit is a 52 y.o. year old female with medical history significant for inflammatory, stricturing Crohn's disease, history of C diff colitis, recent Colectomy on 02/11/23 secondary to refractory Crohn's disease and necrosis of the bowel due to fulminant C diff colitis, with PICC line infection with C. Glabrata, currently on Micafungin, who presented to the hospital on 12/1, now with passage of blood clots from the rectum.   Rectal bleeding: history of Crohn's disease, last flex sig in October with inflammation in the rectum, new onset of rectal bleeding that began over the past few days. No blood noted in her ostomy. C diff and GI pathogen panel negative. She was quite uncomfortable on rectal exam, though no obvious lesions noted. Not currently on therapy for CD given recent colectomy. Could be dealing with continued active inflammation of remaining portion of the rectum secondary to her Crohn's disease.  CRP on admission was 1.5, though non specific in setting of sepsis and known infection. Presentation concerning for  for rectal inflammation vs. Diversion colitis. May benefit from undergoing flexible sigmoidoscopy for further evaluation. Will discuss case further with Dr. Jena Gauss.   Plan / Recommendations   PPI daily Anti emetics per hospitalist Continue with broad spectrum antibiotics  4. Discuss further evaluation with possible flex sig with Dr. Jena Gauss 5. Continue to hold Eliqui (last dose was 12/3)   04/06/2023, 9:54 AM  Nancye Grumbine L. Jeanmarie Hubert, MSN, APRN, AGNP-C Adult-Gerontology Nurse Practitioner [facility]

## 2023-04-06 NOTE — Progress Notes (Signed)
   04/06/23 1751  Assess: MEWS Score  Temp 98 F (36.7 C)  BP 103/89  MAP (mmHg) 95  Pulse Rate (!) 123  Resp 16  SpO2 100 %  Assess: MEWS Score  MEWS Temp 0  MEWS Systolic 0  MEWS Pulse 2  MEWS RR 0  MEWS LOC 0  MEWS Score 2  MEWS Score Color Yellow  Assess: if the MEWS score is Yellow or Red  Were vital signs accurate and taken at a resting state? Yes  Does the patient meet 2 or more of the SIRS criteria? No  Does the patient have a confirmed or suspected source of infection? Yes  MEWS guidelines implemented  No, previously yellow, continue vital signs every 4 hours  Provider Notification  Provider Name/Title MD [facility]  Date Provider Notified 04/06/23  Time Provider Notified 1757  Assess: SIRS CRITERIA  SIRS Temperature  0  SIRS Pulse 1  SIRS Respirations  0  SIRS WBC 0  SIRS Score Sum  1

## 2023-04-06 NOTE — Progress Notes (Signed)
Wasted removed fentanyl patch with Melida Gimenez RN, New fentanyl patch placed to right chest.

## 2023-04-06 NOTE — Progress Notes (Signed)
Report Given and patient transported to room 314. RN at bedside at time of transfer, all patient belongings verified with patient to include cellphone, charger, food, clothing and cane. Patient with no complaints at time of transfer.

## 2023-04-06 NOTE — Consult Note (Signed)
Gastroenterology Consult   Referring Provider: No ref. provider found Primary Care Physician:  [name], PA-C Primary Gastroenterologist:  Dr. Eldridge Dace ([facility])  Patient ID: [name]; [id number]; 1971/04/05   Admit date: 04/03/2023  LOS: 3 days   Date of Consultation: 04/06/2023  Reason for Consultation:  rectal bleeding on Eliquis  History of Present Illness   [name] is a 52 y.o. year old female with medical history significant for inflammatory, stricturing Crohn's disease, history of C diff colitis, seizures, COPD, T2DM, pancreatitis and recent Colectomy on 02/11/23 secondary to refractory Crohn's disease and necrosis of the bowel due to fulminant C diff colitis, with PICC line infection with C. Glabrata, currently on Micafungin, who presented to the hospital on 12/1 with concern for not having colostomy supplies at home, reported some abdominal pain over the past few days as well as inability to flush one of her PICC line ports. Also found to be in septic shock, with hypotenstion, leukocytosis, started on flagyl, cefipime and vanc as well as fluid resuscitation, yellow drainage noted from stoma site. Patient also with questionable GI bleeding, GI consulted for further evaluation.   Hospital Course: CT A/P wo contrast on 12/1  Decreasing mesenteric stranding, fluid with some residual. Previous drain is also no longer present. Right lower quadrant ostomy. No bowel obstruction.  The bowel overall is relatively decompressed. Improving pleural effusions and lung opacities with some residual opacity in the right lower lobe and lingula. Please correlate for signs of infiltrative recommend continued follow-up.  Hgb down to 8 from 13.3 on 12/1. C diff negative FOBT positive GI path panel pending Iron 40, TIBC 230, sat 17, ferritin 112, folate  11.6, b 12 260  Consult: Pertinent history: diagnosed with CD in 1998 and has had severe disease activity with  colonic inflammation, rectal stricture, abdominal pain, and diarrhea since 2022. hospitalized multiple times at many institutions for her CD.  Her major CD sequelae has been a rectal stricture measuring 6mm as of August 2023. Jan 2024 she remained w/severe abd pain, diarrhea despite oral entocort- planned for topical steroid foam and initiation with Skyrizi. She also tried a course of oral steroids without sx change. Pt was approved for [employer], but pt did not complete induction infusions. Seen at [facility] in June and again recommended to start skyrizi which appears to have been started in July   Patient was admitted to [facility] in September due to septic shock/PEA arrest/lactic acidosis, also with CGE and melena. She underwent flex sig 10/2 which showed severe colitis. No c diff on PCR, started on oral vanc. CRP 13.4. she developed an ileus thereafter, underwent flex sig again on 10/10 with findings of diffuse severe inflammation, pseudomembranous colitis, possible ischemia. Underwent exploratory lapartomy with total colectomy on 10/11 due to findings of necrotic bowel.  Today, patient states that she had been having issues with her ostomy bag staying on, had come to the ED x2 for assistance with this and reports she was admitted on her 3rd visit. Since admission, she has noted BRB clots that appear to be coming from her rectum anytime she sits down on the toilet. She has also noted a lot of blood on toilet tissue. Feels that bleeding has become heavier since it began. She does not a sensation in rectal area of pressure like she is constipated. Noted this right after her colectomy as well. No blood noted in ostomy bag.   She notes abdomen is very sore and she has a lot  of abdominal cramping as well.   She began having nausea and vomiting yesterday. But seems to be tolerating food okay today as nausea medication seems to be providing relief of her symptoms.     Prior to Admission medications   Medication Sig  Start Date End Date Taking? Authorizing Provider  acetaminophen (TYLENOL) 325 MG tablet Take 2 tablets (650 mg total) by mouth every 6 (six) hours as needed for mild pain or headache (or Fever >/= 101). 01/03/22  Yes Vassie Loll, MD  ALPRAZolam Prudy Feeler) 1 MG tablet Take 1 mg by mouth 3 (three) times daily as needed for anxiety. 07/11/19  Yes [provider]  amitriptyline (ELAVIL) 100 MG tablet Take 100 mg by mouth at bedtime.   Yes [provider]  apixaban (ELIQUIS) 5 MG TABS tablet Take 1 tablet (5 mg total) by mouth 2 (two) times daily. 03/19/23 04/18/23 Yes Lanae Boast, MD  cetirizine (ZYRTEC) 10 MG tablet Take 10 mg by mouth daily. 01/08/22  Yes [provider]  clotrimazole-betamethasone (LOTRISONE) cream SMARTSIG:Sparingly Topical Twice Daily PRN   Yes [provider]  cyclobenzaprine (FLEXERIL) 10 MG tablet Take 10 mg by mouth 3 (three) times daily as needed.   Yes [provider]  diphenoxylate-atropine (LOMOTIL) 2.5-0.025 MG tablet Take 1 tablet by mouth 4 (four) times daily. 03/19/23  Yes Lanae Boast, MD  DULoxetine (CYMBALTA) 30 MG capsule Take 30 mg by mouth daily. 12/17/21  Yes [provider]  feeding supplement (ENSURE ENLIVE / ENSURE PLUS) LIQD Take 237 mLs by mouth 2 (two) times daily between meals. 01/04/22  Yes Vassie Loll, MD  fentaNYL (DURAGESIC) 25 MCG/HR Place 1 patch onto the skin every 3 (three) days. 03/21/23  Yes [provider]  folic acid (FOLVITE) 1 MG tablet Take 1 tablet (1 mg total) by mouth daily. 10/03/21  Yes Tat, Onalee Hua, MD  gabapentin (NEURONTIN) 300 MG capsule TAKE (1) CAPSULE BY MOUTH TWICE DAILY. 10/15/22  Yes [provider]  hydrOXYzine (ATARAX) 50 MG tablet Take 25-50 mg by mouth 3 (three) times daily as needed.   Yes [provider]  ivabradine (CORLANOR) 5 MG TABS tablet Take 1 tablet (5 mg total) by mouth 2 (two) times daily with a meal. 03/19/23 04/18/23 Yes Kc, Dayna Barker, MD   levETIRAcetam (KEPPRA) 750 MG tablet Take 750 mg by mouth 2 (two) times daily. 09/30/20  Yes [provider]  loperamide (IMODIUM) 2 MG capsule Take 2 capsules (4 mg total) by mouth 4 (four) times daily. 03/19/23  Yes Lanae Boast, MD  metoprolol tartrate (LOPRESSOR) 25 MG tablet Take 25 mg by mouth 2 (two) times daily. 03/19/23  Yes [provider]  micafungin (MYCAMINE) 100 MG SOLR injection Inject 50 mg into the vein. 03/30/23  Yes [provider]  nitroGLYCERIN (NITROSTAT) 0.4 MG SL tablet Place 1 tablet (0.4 mg total) under the tongue every 5 (five) minutes as needed for chest pain. 03/19/21  Yes Rehman, Joline Maxcy, MD  ondansetron (ZOFRAN-ODT) 4 MG disintegrating tablet Take 4 mg by mouth every 8 (eight) hours as needed for nausea or vomiting. 09/07/21  Yes [provider]  pantoprazole (PROTONIX) 40 MG tablet Take 1 tablet (40 mg total) by mouth 2 (two) times daily. 01/03/22  Yes Vassie Loll, MD  RESTASIS 0.05 % ophthalmic emulsion Place 1 drop into both eyes 2 (two) times daily. 12/12/20  Yes [provider]  SPIRIVA RESPIMAT 2.5 MCG/ACT AERS Inhale 1 puff into the lungs daily. 01/03/22  Yes  Vassie Loll, MD  thiamine (VITAMIN B1) 100 MG tablet Take 1 tablet (100 mg total) by mouth daily. 03/20/23 04/19/23 Yes Kc, Dayna Barker, MD  triazolam (HALCION) 0.25 MG tablet Take 0.25 mg by mouth daily.   Yes [provider]  vancomycin (VANCOCIN) 125 MG capsule Take 1 capsule (125 mg total) by mouth every other day for 8 days, THEN 1 capsule (125 mg total) every 3 (three) days for 9 days. 03/20/23 04/06/23 Yes Lanae Boast, MD  Vitamin D, Ergocalciferol, (DRISDOL) 1.25 MG (50000 UNIT) CAPS capsule Take 50,000 Units by mouth once a week. Takes on Wednesdays. 12/14/21  Yes [provider]    Current Facility-Administered Medications  Medication Dose Route Frequency Provider Last Rate Last Admin   acetaminophen (TYLENOL) tablet 650 mg  650 mg Oral Q6H PRN  Briscoe Burns, MD   650 mg at 04/05/23 2249   Or   acetaminophen (TYLENOL) suppository 650 mg  650 mg Rectal Q6H PRN Briscoe Burns, MD       ALPRAZolam Prudy Feeler) tablet 0.5 mg  0.5 mg Oral BID PRN Sherryll Burger, Pratik D, DO   0.5 mg at 04/05/23 2235   amitriptyline (ELAVIL) tablet 100 mg  100 mg Oral QHS Briscoe Burns, MD   100 mg at 04/05/23 2235   cefadroxil (DURICEF) capsule 500 mg  500 mg Oral BID Danelle Earthly, MD   500 mg at 04/06/23 1884   Chlorhexidine Gluconate Cloth 2 % PADS 6 each  6 each Topical Daily Briscoe Burns, MD   6 each at 04/06/23 1660   doxycycline (VIBRA-TABS) tablet 100 mg  100 mg Oral Q12H Danelle Earthly, MD   100 mg at 04/06/23 0921   fentaNYL (DURAGESIC) 25 MCG/HR 1 patch  1 patch Transdermal Q72H Briscoe Burns, MD   1 patch at 04/03/23 2115   fluticasone furoate-vilanterol (BREO ELLIPTA) 100-25 MCG/ACT 1 puff  1 puff Inhalation Daily Briscoe Burns, MD   1 puff at 04/06/23 0837   gabapentin (NEURONTIN) capsule 300 mg  300 mg Oral BID Briscoe Burns, MD   300 mg at 04/06/23 6301   HYDROmorphone (DILAUDID) injection 0.5 mg  0.5 mg Intravenous Q4H PRN Maurilio Lovely D, DO   0.5 mg at 04/05/23 1758   levETIRAcetam (KEPPRA) tablet 750 mg  750 mg Oral BID Briscoe Burns, MD   750 mg at 04/06/23 6010   magnesium sulfate IVPB 4 g 100 mL  4 g Intravenous Once Laural Benes, Clanford L, MD 50 mL/hr at 04/06/23 0924 4 g at 04/06/23 0924   metroNIDAZOLE (FLAGYL) tablet 500 mg  500 mg Oral Q12H Danelle Earthly, MD   500 mg at 04/06/23 9323   micafungin (MYCAMINE) 150 mg in sodium chloride 0.9 % 100 mL IVPB  150 mg Intravenous Q24H Briscoe Burns, MD   Stopped at 04/05/23 1121   midodrine (PROAMATINE) tablet 5 mg  5 mg Oral TID WC [name], Pratik D, DO   5 mg at 04/06/23 5573   norepinephrine (LEVOPHED) 4mg  in (0.016 mg/mL) premix infusion  2-10 mcg/min Intravenous Titrated Rondel Baton, MD   Stopped at 04/04/23 1250   ondansetron (ZOFRAN) injection 4 mg  4 mg  Intravenous Q6H PRN Maurilio Lovely D, DO   4 mg at 04/05/23 1935   Oral care mouth rinse  15 mL Mouth Rinse PRN Briscoe Burns, MD       pantoprazole (PROTONIX) EC tablet 40 mg  40 mg Oral Daily Jinwala,  Dimas Alexandria, MD   40 mg at 04/06/23 1610   prochlorperazine (COMPAZINE) injection 10 mg  10 mg Intravenous Q6H PRN Adefeso, Oladapo, DO   10 mg at 04/05/23 2353   sodium chloride flush (NS) 0.9 % injection 10 mL  10 mL Intravenous Q12H Arabella Merles, PA-C   10 mL at 04/06/23 9604   vancomycin (VANCOCIN) capsule 125 mg  125 mg Oral BID Danelle Earthly, MD   125 mg at 04/06/23 5409    Allergies as of 04/03/2023 - Review Complete 04/03/2023  Allergen Reaction Noted   Codeine Shortness Of Breath, Swelling, and Rash    Hydrocodone Shortness Of Breath, Swelling, and Rash 12/26/2012   Ketorolac Nausea And Vomiting, Swelling, and Nausea Only 06/18/2022   Morphine and codeine Shortness Of Breath, Swelling, and Rash 12/26/2012   Penicillins Shortness Of Breath, Swelling, and Other (See Comments)    Nickel Rash 11/19/2021   Pregabalin Other (See Comments) 06/18/2021   Acetaminophen  12/26/2012   Atarax [hydroxyzine] Itching and Swelling 02/23/2023   Ativan [lorazepam] Other (See Comments) 12/20/2016   Oxycodone-acetaminophen Nausea Only and Nausea And Vomiting 03/22/2023   Strawberry extract Swelling 07/22/2011   Watermelon concentrate Swelling 07/22/2011   Albuterol Palpitations 07/08/2021   Benadryl [diphenhydramine hcl] Palpitations 09/23/2011   Humira [adalimumab] Rash 12/24/2021   Latex Rash    Remicade [infliximab] Rash 07/08/2021   Tramadol Rash 01/12/2022    Family History  Problem Relation Age of Onset   Cancer Mother    Heart failure Mother    Hypertension Mother    Heart attack Mother    Heart attack Father 17   Cancer Father 89   Heart failure Father    Diabetes Father    Crohn's disease Cousin     Review of Systems   Gen: Denies any fever, chills, loss of appetite,  change in weight or weight loss CV: Denies chest pain, heart palpitations, syncope, edema  Resp: Denies shortness of breath with rest, cough, wheezing, coughing up blood, and pleurisy. GI: Denies vomiting blood, jaundice, and fecal incontinence.   Denies dysphagia or odynophagia. GU : Denies urinary burning, blood in urine, urinary frequency, and urinary incontinence. MS: Denies joint pain, limitation of movement, swelling, cramps, and atrophy.  Derm: Denies rash, itching, dry skin, hives. Psych: Denies depression, anxiety, memory loss, hallucinations, and confusion. Heme: Denies bruising or bleeding Neuro:  Denies any headaches, dizziness, paresthesias, shaking  Physical Exam   Vital Signs in last 24 hours: Temp:  [97.7 F (36.5 C)-98.2 F (36.8 C)] 98.1 F (36.7 C) (12/04 0800) Pulse Rate:  [115-127] 119 (12/04 0700) Resp:  [11-25] 23 (12/04 0700) BP: (83-133)/(28-84) 118/58 (12/04 0700) SpO2:  [78 %-100 %] 100 % (12/04 0840) Last BM Date : 04/05/23  General:   Alert,  Well-developed, well-nourished, pleasant and cooperative in NAD Head:  Normocephalic and atraumatic. Eyes:  Sclera clear, no icterus.   Conjunctiva pink. Ears:  Normal auditory acuity. Mouth:  No deformity or lesions, dentition normal. Lungs:  Clear throughout to auscultation.   No wheezes, crackles, or rhonchi. No acute distress. Heart:  Regular rate and rhythm; no murmurs, clicks, rubs,  or gallops. Abdomen:  Soft, TTP of diffuse abdomen, non distended. Ostomy bag to RLQ with liquid brown/yellow stool. Stoma appears healthy looking from what I can see beyond the stool.  No masses, hepatosplenomegaly or hernias noted. Normal bowel sounds, without guarding, and without rebound.   Rectal: no obvious external or internal lesions noted on exam, pt  reported a lot of discomfort on DRE with sphincter tone somewhat hypertonic.    Msk:  Symmetrical without gross deformities. Normal posture. Extremities:  Without clubbing or  edema. Neurologic:  Alert and  oriented x4. Skin:  Intact without significant lesions or rashes. Psych:  Alert and cooperative. Normal mood and affect.   Labs/Studies   Recent Labs Recent Labs    04/04/23 0312 04/05/23 0421 04/06/23 0448  WBC 10.9* 7.9 7.4  HGB 9.4* 8.3* 8.0*  HCT 28.1* 26.2* 25.9*  PLT 683* 553* 535*   BMET Recent Labs    04/04/23 0312 04/05/23 0421 04/06/23 0448  NA 129* 133* 132*  K 3.3* 3.7 3.3*  CL 94* 100 100  CO2 22 22 24   GLUCOSE 115* 87 91  BUN 23* 9 <5*  CREATININE 1.38* 0.65 0.56  CALCIUM 7.6* 7.5* 7.5*   LFT Recent Labs    04/03/23 1130 04/04/23 0312  PROT 8.8* 5.2*  ALBUMIN 3.8 2.3*  AST 47* 25  ALT 35 18  ALKPHOS 212* 118  BILITOT 0.5 0.5   PT/INR Recent Labs    04/03/23 1130 04/04/23 0312  LABPROT 18.1* 16.7*  INR 1.5* 1.3*   C-Diff Recent Labs    04/05/23 1300  CDIFFTOX NEGATIVE     Assessment   Ariam Leifheit is a 52 y.o. year old female with medical history significant for inflammatory, stricturing Crohn's disease, history of C diff colitis, recent Colectomy on 02/11/23 secondary to refractory Crohn's disease and necrosis of the bowel due to fulminant C diff colitis, with PICC line infection with C. Glabrata, currently on Micafungin, who presented to the hospital on 12/1, now with passage of blood clots from the rectum.   Rectal bleeding: history of Crohn's disease, last flex sig in October with inflammation in the rectum, new onset of rectal bleeding that began over the past few days. No blood noted in her ostomy. C diff and GI pathogen panel negative. She was quite uncomfortable on rectal exam, though no obvious lesions noted. Not currently on therapy for CD given recent colectomy. Could be dealing with continued active inflammation of remaining portion of the rectum secondary to her Crohn's disease.  CRP on admission was 1.5, though non specific in setting of sepsis and known infection. Presentation concerning for  for rectal inflammation vs. Diversion colitis. May benefit from undergoing flexible sigmoidoscopy for further evaluation. Will discuss case further with Dr. Jena Gauss.   Plan / Recommendations   PPI daily Anti emetics per hospitalist Continue with broad spectrum antibiotics  4. Discuss further evaluation with possible flex sig with Dr. Jena Gauss 5. Continue to hold Eliqui (last dose was 12/3)   04/06/2023, 9:54 AM  Nancye Grumbine L. Jeanmarie Hubert, MSN, APRN, AGNP-C Adult-Gerontology Nurse Practition[facility]

## 2023-04-06 NOTE — Progress Notes (Signed)
PROGRESS NOTE   [name]  MWU:[id number] DOB: May 03, 1971 DOA: 04/03/2023 PCP: [name], PA-C   No chief complaint on file.  Level of care: ICU  Brief Admission History:  52 y.o. female with medical history significant of recent colectomy with Dr. 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   She reports that she was initially in her usual state of health but after arrival to the ED she began having generalized abdominal pain.  She states that her stomach pain is located in the lower portion of her stomach and is fairly diffuse.  She does not recall any alleviating or aggravating factors given that abdominal pain began right after coming to the ED. she was admitted with septic shock, POA with likely intra-abdominal source.  ID consulted for assistance in patient was started on vancomycin and Rocephin and continued on micafungin due to her ongoing fungemia with Candida glabrata.   Assessment and Plan:  Septic Shock, possibly related to stoma site infection- POA Fungemia 2/2 C. Glabrata - POA Abdominal pain -Appreciate ID recommendations with plans to continue vancomycin, Rocephin, and Flagyl  -Micafungin to continue through 12/12 per ID service unless BC positive from this admission  -Started on oral vancomycin twice daily for secondary prophylaxis while on IV antibiotics given history of C. Difficile, now transitioned to doxy and cefadroxil to complete total of 10 days of abx  -Blood and fungal cultures with no growth noted thus far -She has been weaned off of norepinephrine at  this point, but continues to have soft blood pressure readings which are approximately 90/50 at baseline per patient   Hyponatremia-improving -Check BMP tomorrow -Urine osmolality and sodium levels are low, follow-up serum osmolality -Improved with use of normal saline, continue the same for now   Severe hypomagnesemia -give additional IV replacement and recheck in AM.   Worsening anemia -I have asked GI team for consultation given guaiac positive stools -Anemia panel negative -Continue to follow CBC and transfuse for hemoglobin less than 7 -Hold apixaban   AKI-resolved Cr 2.79 on admission, baseline appears to be around 0.6-0.7. Likely in the setting of septic shock and volume depletion. She has received 3L IVF boluses and I have started her on LR @100cc /hr for 12 hours.   Refractory Crohn's disease Recent history of fulminant C. difficile colitis  Status post colectomy (02/2023, Dr. Luisa Hart) Patient has ran out of her colostomy supplies and will need additional supplies at discharge.  It does not appear that she is currently on any medications for her Crohn's disease. -Colostomy supplies -Will need to follow-up with [facility] for further recommendations in regards to Crohns disease -Oral vancomycin per ID -Has high amounts of liquid stool output from ostomy site   Rectal bleeding - GI service consulted due to reports of rectal clots from patient and hemoccult positive stool - apixaban currently being held   Type 2 diabetes Does not appear to be on any medications for this at home.  Last A1c 5.8% 2 months ago. -Trend morning CBGs, goal 140-180   Hypertension, questionable. -States blood pressures are typically 90/50 at home, should not require antihypertensives -Holding home antihypertensives in the setting of  septic shock -Wean norepinephrine as tolerated   History of DVT/PE Noted during recent hospitalization in 02/2023.  She is on Eliquis at home and will continue during  hospitalization. -Holding apixaban for now due to GI bleeding and guaiac positive stools   Thrombocytosis Noted during prior hospitalization as well.  Evaluated by hematology and felt to be inflammatory.  Studies at that time included BCR-ABL-1, CML/ALL PCR, JAK2 which were all negative.  Will continue to monitor   Hx of Cardiac arrest (01/2023) -telemetry   Seizure disorder -Continue home Keppra and gabapentin   GERD -Continue home PPI   COPD Not in exacerbation. -Continue LABA/ICS   Anxiety Patient is on Xanax 1 mg 3 times daily as needed and triazolam 0.25 mg daily.  Reviewed PDMP, she is appropriately obtaining Xanax and triazolam.  Will continue these medications while here at slightly reduced doses. -Reduced Xanax to 0.5 mg 2 times daily as needed -Continue home triazolam 0.25 mg daily   DVT prophylaxis: apixaban on hold (SCDs) Code Status: Full Family Communication:  Disposition: anticipate home when medically stabilized    Consultants:  I.D. GI   Subjective: Pt reports she has been having blood clots from rectum.    Objective: Vitals:   04/06/23 1100 04/06/23 1130 04/06/23 1236 04/06/23 1300  BP: (!) 103/41 91/63  (!) 113/38  Pulse: (!) 120 (!) 117  (!) 114  Resp: 14 (!) 25  15  Temp:   98.3 F (36.8 C)   TempSrc:   Axillary   SpO2: 100% 100%  100%  Weight:      Height:        Intake/Output Summary (Last 24 hours) at 04/06/2023 1538 Last data filed at 04/06/2023 1503 Gross per 24 hour  Intake 1529.79 ml  Output 1300 ml  Net 229.79 ml   Filed Weights   04/03/23 1115 04/04/23 0545  Weight: 59 kg 58.8 kg   Examination:  General exam: Appears chronically ill but no acute distress.  Respiratory system: Clear to auscultation. Respiratory effort normal. Cardiovascular system: normal S1 & S2 heard. No JVD, murmurs, rubs, gallops or clicks. No pedal edema. Gastrointestinal system: Abdomen is mildly distended, ostomy appears normal with watery brown stool  seen.  Abd very tender to light palpation diffusely. No organomegaly or masses felt. hypoactive bowel sounds heard. Central nervous system: Alert and oriented. No focal neurological deficits. Extremities: Symmetric 5 x 5 power. Skin: No rashes, lesions or ulcers. Psychiatry: Judgement and insight appear normal. Mood & affect appropriate.   Data Reviewed: I have personally reviewed following labs and imaging studies  CBC: Recent Labs  Lab 04/03/23 1130 04/04/23 0312 04/05/23 0421 04/06/23 0448  WBC 12.2* 10.9* 7.9 7.4  NEUTROABS 8.7*  --   --   --   HGB 13.3 9.4* 8.3* 8.0*  HCT 39.0 28.1* 26.2* 25.9*  MCV 82.1 83.1 86.8 86.6  PLT 834* 683* 553* 535*    Basic Metabolic Panel: Recent Labs  Lab 04/03/23 1130 04/04/23 0312 04/05/23 0421 04/06/23 0448  NA 123* 129* 133* 132*  K 3.9 3.3* 3.7 3.3*  CL 79* 94* 100 100  CO2 26 22 22 24   GLUCOSE 104* 115* 87 91  BUN 34* 23* 9 <5*  CREATININE 2.79* 1.38* 0.65 0.56  CALCIUM 8.9 7.6* 7.5* 7.5*  MG  --   --  0.6* 1.3*    CBG: No results for input(s): "GLUCAP" in the last 168 hours.  Recent Results (from the past 240 hour(s))  Blood Culture (  routine x 2)     Status: None (Preliminary result)   Collection Time: 04/03/23 11:30 AM   Specimen: BLOOD RIGHT ARM  Result Value Ref Range Status   Specimen Description   Final    BLOOD RIGHT ARM BOTTLES DRAWN AEROBIC AND ANAEROBIC   Special Requests Blood Culture adequate volume  Final   Culture   Final    NO GROWTH 3 DAYS Performed at [facility], [address] [place], [place] [address]    Report Status PENDING  Incomplete  Blood Culture (routine x 2)     Status: None (Preliminary result)   Collection Time: 04/03/23 11:35 AM   Specimen: BLOOD LEFT ARM  Result Value Ref Range Status   Specimen Description BLOOD LEFT ARM BOTTLES DRAWN AEROBIC AND ANAEROBIC  Final   Special Requests Blood Culture adequate volume  Final   Culture   Final    NO GROWTH 3 DAYS Performed at [facility], [address] [place], [place] [address]    Report Status PENDING  Incomplete  MRSA Next Gen by PCR, Nasal     Status: None   Collection Time: 04/03/23  1:57 PM   Specimen: Nasal Mucosa; Nasal Swab  Result Value Ref Range Status   MRSA by PCR Next Gen NOT DETECTED NOT DETECTED Final    Comment: (NOTE) The GeneXpert MRSA Assay (FDA approved for NASAL specimens only), is one component of a comprehensive MRSA colonization surveillance program. It is not intended to diagnose MRSA infection nor to guide or monitor treatment for MRSA infections. Test performance is not FDA approved in patients less than 51 years old. Performed at [facility], [address] [place], [place] [address]   Fungus culture, blood     Status: None (Preliminary result)   Collection Time: 04/03/23  5:49 PM   Specimen: BLOOD RIGHT HAND  Result Value Ref Range Status   Specimen Description BLOOD RIGHT HAND  Final   Special Requests   Final    BOTTLES DRAWN AEROBIC ONLY Blood Culture results may not be optimal due to an inadequate volume of blood received in culture bottles   Culture   Final    NO GROWTH 3 DAYS Performed at [facility], [address], [place], [place] [address]    Report Status PENDING  Incomplete  C Difficile Quick Screen w PCR reflex     Status: None   Collection Time: 04/05/23  1:00 PM   Specimen: Stool  Result Value Ref Range Status   C Diff antigen NEGATIVE NEGATIVE Final   C Diff toxin NEGATIVE NEGATIVE Final   C Diff interpretation No C. difficile detected.  Final    Comment: Performed at [facility], [address], [place], [place] [address]     Radiology Studies: No results found.  Scheduled Meds:  amitriptyline  100 mg Oral QHS   cefadroxil  500 mg Oral BID   Chlorhexidine Gluconate Cloth  6 each Topical Daily   cycloSPORINE  1 drop Both Eyes BID   diphenoxylate-atropine  1 tablet Oral QID   doxycycline  100 mg Oral Q12H   feeding supplement  237 mL Oral BID BM    fentaNYL  1 patch Transdermal Q72H   fluticasone furoate-vilanterol  1 puff Inhalation Daily   folic acid  1 mg Oral Daily   gabapentin  300 mg Oral BID   levETIRAcetam  750 mg Oral BID   loratadine  10 mg Oral Daily   metoprolol tartrate  12.5 mg Oral BID   metroNIDAZOLE  500 mg Oral Q12H   midodrine  5 mg Oral TID WC   pantoprazole  40 mg Oral Daily   polycarbophil  1,250 mg Oral BID   sodium chloride flush  10 mL Intravenous Q12H   thiamine  100 mg Oral Daily   vancomycin  125 mg Oral BID   Vitamin D (Ergocalciferol)  50,000 Units Oral Weekly   Continuous Infusions:  micafungin (MYCAMINE) 150 mg in sodium chloride 0.9 % 100 mL IVPB Stopped (04/06/23 1113)   norepinephrine (LEVOPHED) Adult infusion Stopped (04/04/23 1250)     LOS: 3 days   Time spent: 52 mins  Barnett Elzey Laural Benes, MD How to contact the [facility] Attending or Consulting provider 7A - 7P or covering provider during after hours 7P -7A, for this patient?  Check the care team in [facility] and look for a) attending/consulting TR[facility] provider listed and b) the [facility] team listed Log into [email or web address] to find provider on call.  Locate the [facility] provider you are looking for under [facility] and page to a number that you can be directly reached. If you still have difficulty reaching the provider, please page the [facility] (Director on Call) for the Hospitalists listed on amion for assistance.  04/06/2023, 3:38 PM

## 2023-04-06 NOTE — Progress Notes (Signed)
ID Brief Note: 52 year old female with 9/26-11/16 hospitalization for intra-abdominal infection, candidemia on micafungin till 12/12 admitted with: #Septic shock secondary to suspected secondary stoma site infection #History of Candida glabrata fungemia on micafungin until 12/12 with plan for p.o. #History of intra-abdominal infection C. difficile requiring colectomy on 10/11 - Patient was having abdominal pain, blood pressure 880s over 30s, WBC 12.2 K.  CT abdomen pelvis without acute intra-abdominal infection. 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  Hospital course s placated by Candida glabrata fungemia, infarcts in spleen and emboli to the lung concern.  Embolic fungal endocarditis plan was to treat with 6 weeks of micafungin 11/27+/-p.o. antifungals given no sedation seen on bowels pending clinical progression.  The biotics were extended on 11/26 to 12/12 patient missed her appointment with Dr. Algis Liming, antibiotics extended.  There is also a prior Auth for Shelle [employer] has been approved.   ID was engaged and recommended differentiating, transition cefepime to ceftriaxone. Recommendations:  -Continue micafungin, EOT 12/12 unless blood cultures from admission are positive. - Patient transition from vancomycin and ceftriaxone, Flagyl to Doxy and cefadroxil to complete total of 10 days of antibiotic for cellulitis around stoma site - Wound care for stoma site management - Follow-up blood and fungal blood cultures to ensure clearance

## 2023-04-07 ENCOUNTER — Other Ambulatory Visit (HOSPITAL_COMMUNITY): Payer: Self-pay

## 2023-04-07 DIAGNOSIS — E871 Hypo-osmolality and hyponatremia: Secondary | ICD-10-CM

## 2023-04-07 DIAGNOSIS — A419 Sepsis, unspecified organism: Secondary | ICD-10-CM | POA: Diagnosis not present

## 2023-04-07 DIAGNOSIS — K625 Hemorrhage of anus and rectum: Secondary | ICD-10-CM

## 2023-04-07 DIAGNOSIS — R6521 Severe sepsis with septic shock: Secondary | ICD-10-CM | POA: Diagnosis not present

## 2023-04-07 DIAGNOSIS — D62 Acute posthemorrhagic anemia: Secondary | ICD-10-CM

## 2023-04-07 LAB — BASIC METABOLIC PANEL
Anion gap: 6 (ref 5–15)
BUN: <5 mg/dL — ABNORMAL LOW (ref 6–20)
CO2: 25 mmol/L (ref 22–32)
Calcium: 8.2 mg/dL — ABNORMAL LOW (ref 8.9–10.3)
Chloride: 101 mmol/L (ref 98–111)
Creatinine, Ser: 0.58 mg/dL (ref 0.44–1.00)
GFR, Estimated: >60 mL/min (ref >60)
Glucose, Bld: 92 mg/dL (ref 70–99)
Potassium: 3.5 mmol/L (ref 3.5–5.1)
Sodium: 132 mmol/L — ABNORMAL LOW (ref 135–145)

## 2023-04-07 LAB — CBC
HCT: 27.2 % — ABNORMAL LOW (ref 36.0–46.0)
Hemoglobin: 8.8 g/dL — ABNORMAL LOW (ref 12.0–15.0)
MCH: 28 pg (ref 26.0–34.0)
MCHC: 32.4 g/dL (ref 30.0–36.0)
MCV: 86.6 fL (ref 80.0–100.0)
Platelets: 522 10*3/uL — ABNORMAL HIGH (ref 150–400)
RBC: 3.14 MIL/uL — ABNORMAL LOW (ref 3.87–5.11)
RDW: 14.7 % (ref 11.5–15.5)
WBC: 7.7 10*3/uL (ref 4.0–10.5)
nRBC: 0 % (ref 0.0–0.2)

## 2023-04-07 LAB — MAGNESIUM: Magnesium: 1.7 mg/dL (ref 1.7–2.4)

## 2023-04-07 MED ORDER — MAGNESIUM SULFATE 4 GM/100ML IV SOLN
4.0000 g | Freq: Once | INTRAVENOUS | Status: AC
  Administered 2023-04-07: 4 g via INTRAVENOUS
  Filled 2023-04-07: qty 100

## 2023-04-07 MED ORDER — CRESEMBA 186 MG PO CAPS
186.0000 mg | ORAL_CAPSULE | Freq: Every day | ORAL | 0 refills | Status: DC
  Filled 2023-04-07: qty 28, 28d supply, fill #0

## 2023-04-07 NOTE — Progress Notes (Signed)
Gastroenterology Progress Note   Referring Provider: No ref. provider found Primary Care Physician:  [name], PA-C Primary Gastroenterologist:  Dr. Eldridge Dace ([facility])  Patient ID: Nonie Hoyer; [id number]; [birth year]   Subjective:    Nonbloody liquid stool in her ostomy, not draining well and leaking. Large blood clot from her rectum yesterday.  Complains of diffuse abdominal tenderness, lower abdomen, crampy.  Objective:   Vital signs in last 24 hours: Temp:  [98 F (36.7 C)-98.5 F (36.9 C)] 98.2 F (36.8 C) (12/04 2221) Pulse Rate:  [112-131] 119 (12/04 2243) Resp:  [13-25] 20 (12/04 2221) BP: (91-120)/(34-89) 101/61 (12/04 2243) SpO2:  [95 %-100 %] 95 % (12/04 2221) Last BM Date : 04/06/23 General:   Alert,  Well-developed, well-nourished, pleasant and cooperative in NAD Head:  Normocephalic and atraumatic. Eyes:  Sclera clear, no icterus.   Abdomen:  Soft,  nondistended. Ostomy with liquid brown stool, no overt blood noted. Diffuse abdominal tenderness. Normal bowel sounds, without guarding, and without rebound.   Extremities:  Without clubbing, deformity or edema. Neurologic:  Alert and  oriented x4;  grossly normal neurologically. Skin:  Intact without significant lesions or rashes. Psych:  Alert and cooperative. Normal mood and affect.  Intake/Output from previous day: 12/04 0701 - 12/05 0700 In: 235.3 [IV Piggyback:235.3] Out: 1400 [Urine:300; Stool:1100] Intake/Output this shift: No intake/output data recorded.  Lab Results: CBC Recent Labs    04/05/23 0421 04/06/23 0448 04/07/23 0758  WBC 7.9 7.4 7.7  HGB 8.3* 8.0* 8.8*  HCT 26.2* 25.9* 27.2*  MCV 86.8 86.6 86.6  PLT 553* 535* 522*   BMET Recent Labs    04/05/23 0421 04/06/23 0448 04/07/23 0758  NA 133* 132* 132*  K 3.7 3.3* 3.5  CL 100 100 101  CO2 22 24 25   GLUCOSE 87 91 92  BUN 9 <5* <5*  CREATININE 0.65 0.56 0.58  CALCIUM 7.5* 7.5* 8.2*   LFTs No results for  input(s): "BILITOT", "BILIDIR", "IBILI", "ALKPHOS", "AST", "ALT", "PROT", "ALBUMIN" in the last 72 hours. No results for input(s): "LIPASE" in the last 72 hours. PT/INR No results for input(s): "LABPROT", "INR" in the last 72 hours.       Imaging Studies: CT ABDOMEN PELVIS WO CONTRAST  Result Date: 04/03/2023 CLINICAL DATA:  Abdominal pain.  Hypotension. EXAM: CT ABDOMEN AND PELVIS WITHOUT CONTRAST TECHNIQUE: Multidetector CT imaging of the abdomen and pelvis was performed following the standard protocol without IV contrast. RADIATION DOSE REDUCTION: This exam was performed according to the departmental dose-optimization program which includes automated exposure control, adjustment of the mA and/or kV according to patient size and/or use of iterative reconstruction technique. COMPARISON:  CT 02/25/2023. FINDINGS: Lower chest: Improved left pleural effusion compared to previous. Improving left lung opacity. There is some mild opacity residual in the lingula dependently. There is also some opacity along the anterior aspect of the right lower lobe. A focal infiltrates possible. Recommend follow-up. Breathing motion. Heart is slightly enlarged. Trace pericardial fluid. Hepatobiliary: Heterogeneous liver with some fatty infiltration. Previous cholecystectomy. Pancreas: Unremarkable. No pancreatic ductal dilatation or surrounding inflammatory changes. Spleen: Normal in size without focal abnormality. Adrenals/Urinary Tract: Stable slight thickening of the left adrenal gland. Right adrenal gland is preserved. No abnormal calcifications are seen within either kidney nor along the course of either ureter. Preserved contours of the distended urinary bladder there is persistent wall thickening of the bladder particularly superiorly as seen on coronal series 5, image 49 please correlate for any known history or  further workup when appropriate Stomach/Bowel: Right lower quadrant ileostomy identified. On this non oral  contrast exam the stomach and small bowel are nondilated. Large bowel is relatively decompressed as well. The previous drain is no longer identified. Areas of stranding along the mesentery on left side are decreasing with significant residual. No free air or free fluid. Vascular/Lymphatic: Normal caliber aorta and IVC. Minimal vascular calcifications no discrete abnormal lymph node enlargement identified in the abdomen and pelvis. Previous small node aortocaval measuring 8 mm in short axis on the prior, today measures 6 mm on series 2 image 34. Reproductive: Status post hysterectomy. No adnexal masses. Other: Please see above Musculoskeletal: Curvature of the spine. Scattered degenerative changes. IMPRESSION: Decreasing mesenteric stranding, fluid with some residual. Previous drain is also no longer present. Right lower quadrant ostomy. No bowel obstruction.  The bowel overall is relatively decompressed. No obstructing renal stone. Improving pleural effusions and lung opacities with some residual opacity in the right lower lobe and lingula. Please correlate for signs of infiltrative recommend continued follow-up. Electronically Signed   By: Karen Kays M.D.   On: 04/03/2023 13:17   DG Chest Port 1 View  Result Date: 04/03/2023 CLINICAL DATA:  Possible sepsis. EXAM: PORTABLE CHEST 1 VIEW COMPARISON:  February 28, 2023. FINDINGS: The heart size and mediastinal contours are within normal limits. Left internal jugular catheter is noted with distal tip in expected position of the SVC. Both lungs are clear. The visualized skeletal structures are unremarkable. IMPRESSION: No active disease. Electronically Signed   By: Lupita Raider M.D.   On: 04/03/2023 12:42  [2 weeks]  Assessment:   Lessly Chiang is a 52 y.o. year old female with medical history significant for inflammatory, stricturing Crohn's disease, history of C diff colitis, recent Colectomy on 02/11/23 secondary to refractory Crohn's disease and necrosis  of the bowel due to fulminant C diff colitis, with PICC line infection with C. Glabrata, currently on Micafungin, who presented to the hospital on 12/1, now with passage of blood clots from the rectum.    Rectal bleeding: history of Crohn's disease, last flex sig in October with inflammation in the rectum, new onset of rectal bleeding that began over the past few days. No blood noted in her ostomy. C diff and GI pathogen panel negative. She was quite uncomfortable on rectal exam, though no obvious lesions noted. Not currently on therapy for Crohn's given recent colectomy.   Differential diagnosis includes diversion colitis, persisting active IBD, less likely localized ischemia.      Plan:   Plans for flex sig tomorrow (after adequate Eliquis washout) with ultraslim scope. She will receive two gentle tap water enemas tomorrow morning.     LOS: 4 days   Leanna Battles. [facility] [phone] 12/5/20248:36 AM

## 2023-04-07 NOTE — Progress Notes (Signed)
ID Pharmacist Note   Spoke with ID team and will switch Ms. Steinmeyer to [place] on discharge. This will be couriered from [facility] to [facility] today for her to have on discharge.   There are several drug interactions on her list to be aware of: - Ivabradine- Recommend discontinue on discharge.  - Apixaban- Recommend dose reduce to 2.5 mg BID  - Alprazolam- Recommend dose reduce if possible - Fentanyl - Monitor closely   Dr. Laural Benes aware.    Sharin Mons, PharmD, BCPS, BCIDP Infectious Diseases Clinical Pharmacist Phone: [phone] 04/07/2023 11:41 AM

## 2023-04-07 NOTE — Plan of Care (Signed)
  Problem: Education: Goal: Knowledge of General Education information will improve Description Including pain rating scale, medication(s)/side effects and non-pharmacologic comfort measures Outcome: Progressing   Problem: Health Behavior/Discharge Planning: Goal: Ability to manage health-related needs will improve Outcome: Progressing   

## 2023-04-07 NOTE — Consult Note (Signed)
Patient can obtain ostomy supplies through [employer] [email or web address],  [phone]. This supplier accepts Medicaid.

## 2023-04-07 NOTE — H&P (View-Only) (Signed)
 Gastroenterology Progress Note   Referring Provider: No ref. provider found Primary Care Physician:  [name], PA-C Primary Gastroenterologist:  Dr. Eldridge Dace ([facility])  Patient ID: [name]; [id number]; [birth year]   Subjective:    Nonbloody liquid stool in her ostomy, not draining well and leaking. Large blood clot from her rectum yesterday.  Complains of diffuse abdominal tenderness, lower abdomen, crampy.  Objective:   Vital signs in last 24 hours: Temp:  [98 F (36.7 C)-98.5 F (36.9 C)] 98.2 F (36.8 C) (12/04 2221) Pulse Rate:  [112-131] 119 (12/04 2243) Resp:  [13-25] 20 (12/04 2221) BP: (91-120)/(34-89) 101/61 (12/04 2243) SpO2:  [95 %-100 %] 95 % (12/04 2221) Last BM Date : 04/06/23 General:   Alert,  Well-developed, well-nourished, pleasant and cooperative in NAD Head:  Normocephalic and atraumatic. Eyes:  Sclera clear, no icterus.   Abdomen:  Soft,  nondistended. Ostomy with liquid brown stool, no overt blood noted. Diffuse abdominal tenderness. Normal bowel sounds, without guarding, and without rebound.   Extremities:  Without clubbing, deformity or edema. Neurologic:  Alert and  oriented x4;  grossly normal neurologically. Skin:  Intact without significant lesions or rashes. Psych:  Alert and cooperative. Normal mood and affect.  Intake/Output from previous day: 12/04 0701 - 12/05 0700 In: 235.3 [IV Piggyback:235.3] Out: 1400 [Urine:300; Stool:1100] Intake/Output this shift: No intake/output data recorded.  Lab Results: CBC Recent Labs    04/05/23 0421 04/06/23 0448 04/07/23 0758  WBC 7.9 7.4 7.7  HGB 8.3* 8.0* 8.8*  HCT 26.2* 25.9* 27.2*  MCV 86.8 86.6 86.6  PLT 553* 535* 522*   BMET Recent Labs    04/05/23 0421 04/06/23 0448 04/07/23 0758  NA 133* 132* 132*  K 3.7 3.3* 3.5  CL 100 100 101  CO2 22 24 25   GLUCOSE 87 91 92  BUN 9 <5* <5*  CREATININE 0.65 0.56 0.58  CALCIUM 7.5* 7.5* 8.2*   LFTs No results for  input(s): "BILITOT", "BILIDIR", "IBILI", "ALKPHOS", "AST", "ALT", "PROT", "ALBUMIN" in the last 72 hours. No results for input(s): "LIPASE" in the last 72 hours. PT/INR No results for input(s): "LABPROT", "INR" in the last 72 hours.       Imaging Studies: CT ABDOMEN PELVIS WO CONTRAST  Result Date: 04/03/2023 CLINICAL DATA:  Abdominal pain.  Hypotension. EXAM: CT ABDOMEN AND PELVIS WITHOUT CONTRAST TECHNIQUE: Multidetector CT imaging of the abdomen and pelvis was performed following the standard protocol without IV contrast. RADIATION DOSE REDUCTION: This exam was performed according to the departmental dose-optimization program which includes automated exposure control, adjustment of the mA and/or kV according to patient size and/or use of iterative reconstruction technique. COMPARISON:  CT 02/25/2023. FINDINGS: Lower chest: Improved left pleural effusion compared to previous. Improving left lung opacity. There is some mild opacity residual in the lingula dependently. There is also some opacity along the anterior aspect of the right lower lobe. A focal infiltrates possible. Recommend follow-up. Breathing motion. Heart is slightly enlarged. Trace pericardial fluid. Hepatobiliary: Heterogeneous liver with some fatty infiltration. Previous cholecystectomy. Pancreas: Unremarkable. No pancreatic ductal dilatation or surrounding inflammatory changes. Spleen: Normal in size without focal abnormality. Adrenals/Urinary Tract: Stable slight thickening of the left adrenal gland. Right adrenal gland is preserved. No abnormal calcifications are seen within either kidney nor along the course of either ureter. Preserved contours of the distended urinary bladder there is persistent wall thickening of the bladder particularly superiorly as seen on coronal series 5, image 49 please correlate for any known history or  further workup when appropriate Stomach/Bowel: Right lower quadrant ileostomy identified. On this non oral  contrast exam the stomach and small bowel are nondilated. Large bowel is relatively decompressed as well. The previous drain is no longer identified. Areas of stranding along the mesentery on left side are decreasing with significant residual. No free air or free fluid. Vascular/Lymphatic: Normal caliber aorta and IVC. Minimal vascular calcifications no discrete abnormal lymph node enlargement identified in the abdomen and pelvis. Previous small node aortocaval measuring 8 mm in short axis on the prior, today measures 6 mm on series 2 image 34. Reproductive: Status post hysterectomy. No adnexal masses. Other: Please see above Musculoskeletal: Curvature of the spine. Scattered degenerative changes. IMPRESSION: Decreasing mesenteric stranding, fluid with some residual. Previous drain is also no longer present. Right lower quadrant ostomy. No bowel obstruction.  The bowel overall is relatively decompressed. No obstructing renal stone. Improving pleural effusions and lung opacities with some residual opacity in the right lower lobe and lingula. Please correlate for signs of infiltrative recommend continued follow-up. Electronically Signed   By: Karen Kays M.D.   On: 04/03/2023 13:17   DG Chest Port 1 View  Result Date: 04/03/2023 CLINICAL DATA:  Possible sepsis. EXAM: PORTABLE CHEST 1 VIEW COMPARISON:  February 28, 2023. FINDINGS: The heart size and mediastinal contours are within normal limits. Left internal jugular catheter is noted with distal tip in expected position of the SVC. Both lungs are clear. The visualized skeletal structures are unremarkable. IMPRESSION: No active disease. Electronically Signed   By: Lupita Raider M.D.   On: 04/03/2023 12:42  [2 weeks]  Assessment:   [name] is a 52 y.o. year old female with medical history significant for inflammatory, stricturing Crohn's disease, history of C diff colitis, recent Colectomy on 02/11/23 secondary to refractory Crohn's disease and necrosis  of the bowel due to fulminant C diff colitis, with PICC line infection with C. Glabrata, currently on Micafungin, who presented to the hospital on 12/1, now with passage of blood clots from the rectum.    Rectal bleeding: history of Crohn's disease, last flex sig in October with inflammation in the rectum, new onset of rectal bleeding that began over the past few days. No blood noted in her ostomy. C diff and GI pathogen panel negative. She was quite uncomfortable on rectal exam, though no obvious lesions noted. Not currently on therapy for Crohn's given recent colectomy.   Differential diagnosis includes diversion colitis, persisting active IBD, less likely localized ischemia.      Plan:   Plans for flex sig tomorrow (after adequate Eliquis washout) with ultraslim scope. She will receive two gentle tap water enemas tomorrow morning.     LOS: 4 days   Leanna Battles. [facility] [phone] 12/5/20248:36 AM

## 2023-04-07 NOTE — TOC Progression Note (Signed)
Transition of Care [facility]) - Progression Note    Patient Details  Name: [name] MRN: [id number] Date of Birth: [birth year]  Transition of [facility] CM/SW Contact  Leitha Bleak, RN Phone Number: 04/07/2023, 11:30 AM  Clinical Narrative:   MD consulted for ostomy supplies. CM spoke with WOC RN, She has discussed and provided information and education to patient. She offered another resources :[employer] [email or web address], [phone]. This supplier accepts Medicaid. CM printed out this information and delivered to patient.  CM also reminded patient about the Ostomy clinic at [facility], they will also help her order supplies. CM provided  [employer] number for medicaid transportation.    Expected Discharge Plan: Home/Self Care Barriers to Discharge: Continued Medical Work up  Expected Discharge Plan and Services      Living arrangements for the past 2 months: Single Family Home        Social Determinants of Health (SDOH) Interventions SDOH Screenings   Food Insecurity: No Food Insecurity (04/03/2023)  Housing: Low Risk  (04/03/2023)  Transportation Needs: No Transportation Needs (04/03/2023)  Utilities: Not At Risk (04/03/2023)  Financial Resource Strain: Medium Risk (10/18/2022)   Received from [facility]  Social Connections: Unknown (09/01/2021)   Received from [facility], [facility]  Stress: Stress Concern Present (06/25/2020)   Received from [facility], [facility]  Tobacco Use: High Risk (04/03/2023)    Readmission Risk Interventions    04/04/2023    1:36 PM  Readmission Risk Prevention Plan  Transportation Screening Complete  Medication Review (RN Care Manager) Complete  PCP or Specialist appointment within 3-5 days of discharge Not Complete  HRI or Home Care Consult Not Complete  SW Recovery Care/Counseling Consult Not Complete  Palliative Care Screening Not Applicable  Skilled Nursing Facility Not Applicable

## 2023-04-07 NOTE — Progress Notes (Signed)
PROGRESS NOTE   [name]  WUJ:[id number] DOB: 09/19/1970 DOA: 04/03/2023 PCP: [name], PA-C   No chief complaint on file.  Level of care: Telemetry  Brief Admission History:  52 y.o. female with medical history significant of recent colectomy with Dr. 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   She reports that she was initially in her usual state of health but after arrival to the ED she began having generalized abdominal pain.  She states that her stomach pain is located in the lower portion of her stomach and is fairly diffuse.  She does not recall any alleviating or aggravating factors given that abdominal pain began right after coming to the ED. she was admitted with septic shock, POA with likely intra-abdominal source.  ID consulted for assistance in patient was started on vancomycin and Rocephin and continued on micafungin due to her ongoing fungemia with Candida glabrata.   Assessment and Plan:  Septic Shock, possibly related to stoma site infection- POA Fungemia 2/2 C. Glabrata - POA Abdominal pain -Appreciate ID recommendations with plans to continue vancomycin, Rocephin, and Flagyl  -Micafungin to continue through 12/12 per ID service unless BC positive from this admission  -Started on oral vancomycin twice daily for secondary prophylaxis while on IV antibiotics given history of C. Difficile, now transitioned to doxy and cefadroxil to complete total of 10 days of abx  -Blood and fungal cultures with no growth noted thus far -She has been weaned off of  norepinephrine at this point, but continues to have soft blood pressure readings which are approximately 90/50 at baseline per patient -ID made arrangements for her to be approved for Cresemba -pt to follow up with Dr. Daiva Eves on 04/11/23 already scheduled   Hyponatremia-improving -Check BMP tomorrow -Urine osmolality and sodium levels are low, follow-up serum osmolality -Improved with use of normal saline, continue the same for now   Severe hypomagnesemia -improving, give additional IV dose today -recheck in AM    Worsening anemia -I have asked GI team for consultation given guaiac positive stools -Anemia panel negative -Continue to follow CBC and transfuse for hemoglobin less than 7 -Hold apixaban -flex sig planned for 12/5 per GI service   AKI-resolved Cr 2.79 on admission, baseline appears to be around 0.6-0.7. Likely in the setting of septic shock and volume depletion. She has received 3L IVF boluses and I have started her on LR @100cc /hr for 12 hours.   Refractory Crohn's disease Recent history of fulminant C. difficile colitis  Status post colectomy (02/2023, Dr. Luisa Hart) Patient has ran out of her colostomy supplies and will need additional supplies at discharge.  It does not appear that she is currently on any medications for her Crohn's disease. -Colostomy supplies -Will need to follow-up with [facility] for further recommendations in regards to Crohns disease -Oral vancomycin per ID -Has high amounts of liquid stool output from ostomy site -appreciate ostomy nurse consultation    Rectal bleeding - GI service consulted due to reports of rectal clots from patient and hemoccult positive stool - apixaban currently being held   Type 2 diabetes Does not appear to be on any medications for this  at home.  Last A1c 5.8% 2 months ago. -Trend morning CBGs, goal 140-180   Hypertension, questionable. -States blood pressures are typically 90/50 at home, should not require  antihypertensives -Holding home antihypertensives in the setting of septic shock -Wean norepinephrine as tolerated   History of DVT/PE Noted during recent hospitalization in 02/2023.  She is on Eliquis at home and will continue during hospitalization. -Holding apixaban for now due to GI bleeding and guaiac positive stools   Thrombocytosis Noted during prior hospitalization as well.  Evaluated by hematology and felt to be inflammatory.  Studies at that time included BCR-ABL-1, CML/ALL PCR, JAK2 which were all negative.  Will continue to monitor   Hx of Cardiac arrest (01/2023) -telemetry   Seizure disorder -Continue home Keppra and gabapentin   GERD -Continue home PPI   COPD Not in exacerbation. -Continue LABA/ICS   Anxiety Patient is on Xanax 1 mg 3 times daily as needed and triazolam 0.25 mg daily.  Reviewed PDMP, she is appropriately obtaining Xanax and triazolam.  Will continue these medications while here at slightly reduced doses. -Reduced Xanax to 0.5 mg 2 times daily as needed -Continue home triazolam 0.25 mg daily   DVT prophylaxis: apixaban on hold (SCDs) Code Status: Full Family Communication:  Disposition: anticipate home in 1-2 days   Consultants:  I.D. GI   Subjective: Pt says she noted smaller amount of blood clots from rectum.    Objective: Vitals:   04/06/23 1956 04/06/23 2221 04/06/23 2243 04/07/23 1412  BP: 120/64 101/61 101/61 105/64  Pulse: (!) 116 (!) 119 (!) 119 (!) 118  Resp:  20  20  Temp: 98.5 F (36.9 C) 98.2 F (36.8 C)  98.2 F (36.8 C)  TempSrc: Oral Oral  Oral  SpO2: 99% 95%  100%  Weight:      Height:        Intake/Output Summary (Last 24 hours) at 04/07/2023 1729 Last data filed at 04/07/2023 1702 Gross per 24 hour  Intake 240 ml  Output 800 ml  Net -560 ml   Filed Weights   04/03/23 1115 04/04/23 0545  Weight: 59 kg 58.8 kg   Examination:  General exam: Appears chronically ill but no acute distress.  Respiratory  system: Clear to auscultation. Respiratory effort normal. Cardiovascular system: normal S1 & S2 heard. No JVD, murmurs, rubs, gallops or clicks. No pedal edema. Gastrointestinal system: Abdomen is mildly distended, ostomy appears normal with watery brown stool seen.  Abd very tender to light palpation diffusely. No organomegaly or masses felt. hypoactive bowel sounds heard. Central nervous system: Alert and oriented. No focal neurological deficits. Extremities: Symmetric 5 x 5 power. Skin: No rashes, lesions or ulcers. Psychiatry: Judgement and insight appear normal. Mood & affect appropriate.   Data Reviewed: I have personally reviewed following labs and imaging studies  CBC: Recent Labs  Lab 04/03/23 1130 04/04/23 0312 04/05/23 0421 04/06/23 0448 04/07/23 0758  WBC 12.2* 10.9* 7.9 7.4 7.7  NEUTROABS 8.7*  --   --   --   --   HGB 13.3 9.4* 8.3* 8.0* 8.8*  HCT 39.0 28.1* 26.2* 25.9* 27.2*  MCV 82.1 83.1 86.8 86.6 86.6  PLT 834* 683* 553* 535* 522*    Basic Metabolic Panel: Recent Labs  Lab 04/03/23 1130 04/04/23 0312 04/05/23 0421 04/06/23 0448 04/07/23 0758  NA 123* 129* 133* 132* 132*  K 3.9 3.3* 3.7 3.3* 3.5  CL 79* 94* 100 100 101  CO2 26 22 22 24 25   GLUCOSE  104* 115* 87 91 92  BUN 34* 23* 9 <5* <5*  CREATININE 2.79* 1.38* 0.65 0.56 0.58  CALCIUM 8.9 7.6* 7.5* 7.5* 8.2*  MG  --   --  0.6* 1.3* 1.7    CBG: No results for input(s): "GLUCAP" in the last 168 hours.  Recent Results (from the past 240 hour(s))  Blood Culture (routine x 2)     Status: None (Preliminary result)   Collection Time: 04/03/23 11:30 AM   Specimen: BLOOD RIGHT ARM  Result Value Ref Range Status   Specimen Description   Final    BLOOD RIGHT ARM BOTTLES DRAWN AEROBIC AND ANAEROBIC   Special Requests Blood Culture adequate volume  Final   Culture   Final    NO GROWTH 4 DAYS Performed at [facility], [address], [place], [place] [address]    Report Status PENDING  Incomplete  Blood  Culture (routine x 2)     Status: None (Preliminary result)   Collection Time: 04/03/23 11:35 AM   Specimen: BLOOD LEFT ARM  Result Value Ref Range Status   Specimen Description BLOOD LEFT ARM BOTTLES DRAWN AEROBIC AND ANAEROBIC  Final   Special Requests Blood Culture adequate volume  Final   Culture   Final    NO GROWTH 4 DAYS Performed at [facility], [address] [place], [place] [address]    Report Status PENDING  Incomplete  MRSA Next Gen by PCR, Nasal     Status: None   Collection Time: 04/03/23  1:57 PM   Specimen: Nasal Mucosa; Nasal Swab  Result Value Ref Range Status   MRSA by PCR Next Gen NOT DETECTED NOT DETECTED Final    Comment: (NOTE) The GeneXpert MRSA Assay (FDA approved for NASAL specimens only), is one component of a comprehensive MRSA colonization surveillance program. It is not intended to diagnose MRSA infection nor to guide or monitor treatment for MRSA infections. Test performance is not FDA approved in patients less than 53 years old. Performed at [facility], [address], [place], [place] [address]   Fungus culture, blood     Status: None (Preliminary result)   Collection Time: 04/03/23  5:49 PM   Specimen: BLOOD RIGHT HAND  Result Value Ref Range Status   Specimen Description BLOOD RIGHT HAND  Final   Special Requests   Final    BOTTLES DRAWN AEROBIC ONLY Blood Culture results may not be optimal due to an inadequate volume of blood received in culture bottles   Culture   Final    NO GROWTH 4 DAYS Performed at [facility], [address], [place], [place] [address]    Report Status PENDING  Incomplete  Gastrointestinal Panel by PCR , Stool     Status: None   Collection Time: 04/05/23  1:00 PM   Specimen: Stool  Result Value Ref Range Status   Campylobacter species NOT DETECTED NOT DETECTED Final   Plesimonas shigelloides NOT DETECTED NOT DETECTED Final   Salmonella species NOT DETECTED NOT DETECTED Final   Yersinia enterocolitica NOT DETECTED  NOT DETECTED Final   Vibrio species NOT DETECTED NOT DETECTED Final   Vibrio cholerae NOT DETECTED NOT DETECTED Final   Enteroaggregative E coli (EAEC) NOT DETECTED NOT DETECTED Final   Enteropathogenic E coli (EPEC) NOT DETECTED NOT DETECTED Final   Enterotoxigenic E coli (ETEC) NOT DETECTED NOT DETECTED Final   Shiga like toxin producing E coli (STEC) NOT DETECTED NOT DETECTED Final   Shigella/Enteroinvasive E coli (EIEC) NOT  DETECTED NOT DETECTED Final   Cryptosporidium NOT DETECTED NOT DETECTED Final   Cyclospora cayetanensis NOT DETECTED NOT DETECTED Final   Entamoeba histolytica NOT DETECTED NOT DETECTED Final   Giardia lamblia NOT DETECTED NOT DETECTED Final   Adenovirus F40/41 NOT DETECTED NOT DETECTED Final   Astrovirus NOT DETECTED NOT DETECTED Final   Norovirus GI/GII NOT DETECTED NOT DETECTED Final   Rotavirus A NOT DETECTED NOT DETECTED Final   Sapovirus (I, II, IV, and V) NOT DETECTED NOT DETECTED Final    Comment: Performed at [facility], [address] [place], [place] [address]  C Difficile Quick Screen w PCR reflex     Status: None   Collection Time: 04/05/23  1:00 PM   Specimen: Stool  Result Value Ref Range Status   C Diff antigen NEGATIVE NEGATIVE Final   C Diff toxin NEGATIVE NEGATIVE Final   C Diff interpretation No C. difficile detected.  Final    Comment: Performed at [facility], [address] [place], [place] [address]     Radiology Studies: No results found.  Scheduled Meds:  amitriptyline  100 mg Oral QHS   cefadroxil  500 mg Oral BID   Chlorhexidine Gluconate Cloth  6 each Topical Daily   cycloSPORINE  1 drop Both Eyes BID   diphenoxylate-atropine  1 tablet Oral QID   doxycycline  100 mg Oral Q12H   feeding supplement  237 mL Oral BID BM   fentaNYL  1 patch Transdermal Q72H   fluticasone furoate-vilanterol  1 puff Inhalation Daily   folic acid  1 mg Oral Daily   gabapentin  300 mg Oral BID   levETIRAcetam  750 mg Oral BID    loratadine  10 mg Oral Daily   metoprolol tartrate  12.5 mg Oral BID   metroNIDAZOLE  500 mg Oral Q12H   midodrine  5 mg Oral TID WC   pantoprazole  40 mg Oral Daily   polycarbophil  1,250 mg Oral BID   sodium chloride flush  10 mL Intravenous Q12H   thiamine  100 mg Oral Daily   vancomycin  125 mg Oral BID   Vitamin D (Ergocalciferol)  50,000 Units Oral Weekly   Continuous Infusions:  micafungin (MYCAMINE) 150 mg in sodium chloride 0.9 % 100 mL IVPB 150 mg (04/07/23 1246)     LOS: 4 days   Time spent: 54 mins  Eloise Mula Laural Benes, MD How to contact the [facility] Attending or Consulting provider 7A - 7P or covering provider during after hours 7P -7A, for this patient?  Check the care team in [facility] and look for a) attending/consulting TRH provider listed and b) the [facility] team listed Log into [email or web address] to find provider on call.  Locate the [facility] provider you are looking for under [facility] and page to a number that you can be directly reached. If you still have difficulty reaching the provider, please page the [facility] (Director on Call) for the Hospitalists listed on amion for assistance.  04/07/2023, 5:29 PM

## 2023-04-07 NOTE — Progress Notes (Signed)
Mobility Specialist Progress Note:    04/07/23 1532  Mobility  Activity Ambulated with assistance to bathroom;Ambulated with assistance in hallway  Level of Assistance Standby assist, set-up cues, supervision of patient - no hands on  Assistive Device Cane  Distance Ambulated (ft) 200 ft  Range of Motion/Exercises Active;All extremities  Activity Response Tolerated well  Mobility Referral Yes  Mobility visit 1 Mobility  Mobility Specialist Start Time (ACUTE ONLY) 1440  Mobility Specialist Stop Time (ACUTE ONLY) 1505  Mobility Specialist Time Calculation (min) (ACUTE ONLY) 25 min   Pt received in bed, agreeable to mobility. Required SBA to stand and ambulate with cane. Tolerated well, asx throughout. Returned to room, left pt supine. Alarm on, all needs met.   Lawerance Bach Mobility Specialist Please contact via Special educational needs teacher or  Rehab office at [phone]

## 2023-04-07 NOTE — Plan of Care (Signed)
  Problem: Education: Goal: Knowledge of General Education information will improve Description: Including pain rating scale, medication(s)/side effects and non-pharmacologic comfort measures Outcome: Progressing   Problem: Health Behavior/Discharge Planning: Goal: Ability to manage health-related needs will improve Outcome: Progressing   Problem: Clinical Measurements: Goal: Ability to maintain clinical measurements within normal limits will improve Outcome: Progressing Goal: Will remain free from infection Outcome: Progressing Goal: Diagnostic test results will improve Outcome: Progressing Goal: Respiratory complications will improve Outcome: Progressing Goal: Cardiovascular complication will be avoided Outcome: Progressing   Problem: Elimination: Goal: Will not experience complications related to bowel motility Outcome: Progressing Goal: Will not experience complications related to urinary retention Outcome: Progressing   

## 2023-04-07 NOTE — Progress Notes (Signed)
Virtual Visit via Telephone/Video Note   I connected with [name]   On 04/07/2023 at 2:22 PM  by Video and verified that I am speaking with the correct person using two identifiers.   I discussed the limitations, risks, security and privacy concerns of performing an evaluation and management service by video. 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 #History of intra-abdominal infection C. difficile requiring colectomy on 10/11 - Patient was having abdominal pain, blood pressure 880s over 30s, WBC 12.2 K.  CT abdomen pelvis without acute intra-abdominal infection. 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  Hospital course s placated by Candida glabrata fungemia, infarcts in spleen and emboli to the lung concern.  Embolic fungal endocarditis plan was to treat with 6 weeks of micafungin 11/27+/-p.o. antifungals given no sedation seen on bowels pending clinical progression.  The biotics were extended on 11/26 to 12/12 patient missed her appointment with Dr. Algis Liming, antibiotics extended.  There is also a prior Auth for Shelle Iron has been approved.   ID was engaged and recommended differentiating, transition cefepime to ceftriaxone. Recommendations:  -D/C micafungin, start crescemba(Hx of qtc prolongation) - Doxy and cefadroxil to complete total of 10 days of antibiotic for cellulitis around stoma  site EOT 12/10 -Continue secondary prophylaxis vancomycin is 125 mg p.o. twice daily while on antibiotics and 7 days out completing cefadroxil and doxycycline. - Wound care for stoma site management.  Patient states this is her bag is staying on after seen by wound care, ostomy supplies sent to home.  Ostomy support provided for patient. - Follow-up blood and fungal blood cultures to ensure clearance -F/U with ID on 04/11/23 Dr. Daiva Eves -ID will sign off Microbiology:   Antibiotics: 12/1 cefepime 12/1 metronidazole-present vancomycin 12/20   Cultures: Blood 12/1 fungal blood cultures Urine  SUBJECTIVE: Sitting in bed.  No new complaints. Interval: Afebrile overnight.  Review of Systems: Review of Systems  All other systems reviewed and are negative.    Scheduled Meds:  amitriptyline  100 mg Oral QHS   cefadroxil  500 mg Oral BID   Chlorhexidine Gluconate Cloth  6 each Topical Daily   cycloSPORINE  1 drop Both Eyes BID   diphenoxylate-atropine  1 tablet Oral QID   doxycycline  100 mg Oral Q12H   feeding supplement  237 mL Oral BID BM   fentaNYL  1 patch Transdermal Q72H   fluticasone furoate-vilanterol  1 puff Inhalation Daily   folic acid  1 mg Oral Daily   gabapentin  300 mg Oral BID   levETIRAcetam  750 mg Oral BID   loratadine  10 mg Oral Daily   metoprolol tartrate  12.5 mg Oral BID   metroNIDAZOLE  500 mg Oral Q12H   midodrine  5 mg Oral TID WC   pantoprazole  40 mg Oral Daily   polycarbophil  1,250 mg Oral BID   sodium chloride flush  10 mL Intravenous  Q12H   thiamine  100 mg Oral Daily   vancomycin  125 mg Oral BID   Vitamin D (Ergocalciferol)  50,000 Units Oral Weekly   Continuous Infusions:  micafungin (MYCAMINE) 150 mg in sodium chloride 0.9 % 100 mL IVPB 150 mg (04/07/23 1246)   PRN Meds:.acetaminophen **OR** acetaminophen, ALPRAZolam, cyclobenzaprine, HYDROmorphone (DILAUDID) injection, ondansetron (ZOFRAN) IV, mouth rinse, prochlorperazine Allergies   Allergen Reactions   Codeine Shortness Of Breath, Swelling and Rash    Throat swelling   Hydrocodone Shortness Of Breath, Swelling and Rash   Ketorolac Nausea And Vomiting, Swelling and Nausea Only    Pt reports n/v and swelling to throat   Morphine And Codeine Shortness Of Breath, Swelling and Rash   Penicillins Shortness Of Breath, Swelling and Other (See Comments)    Throat swells 02/06/21--TOLERATES CEFTRIAXONE     Nickel Rash   Pregabalin Other (See Comments)   Acetaminophen     Per MD patient states she can't take Tylenol because of liver enzymes   Atarax [Hydroxyzine] Itching and Swelling    Mildly swollen throat   Ativan [Lorazepam] Other (See Comments)    Migraines.   Oxycodone-Acetaminophen Nausea Only and Nausea And Vomiting   Strawberry Extract Swelling   Watermelon Concentrate Swelling   Albuterol Palpitations   Benadryl [Diphenhydramine Hcl] Palpitations   Humira [Adalimumab] Rash   Latex Rash   Remicade [Infliximab] Rash    Blisters and Welts    Tramadol Rash    Rash and itching    OBJECTIVE: Vitals:   04/06/23 1956 04/06/23 2221 04/06/23 2243 04/07/23 1412  BP: 120/64 101/61 101/61 105/64  Pulse: (!) 116 (!) 119 (!) 119 (!) 118  Resp:  20  20  Temp: 98.5 F (36.9 C) 98.2 F (36.8 C)  98.2 F (36.8 C)  TempSrc: Oral Oral  Oral  SpO2: 99% 95%  100%  Weight:      Height:       Body mass index is 22.96 kg/m.  Physical Exam Constitutional:      Appearance: Normal appearance.  HENT:     Head: Normocephalic and atraumatic.     Nose: Nose normal.     Mouth/Throat:     Mouth: Mucous membranes are moist.  Eyes:     Extraocular Movements: Extraocular movements intact.  Musculoskeletal:        General: Normal range of motion.  Neurological:     General: No focal deficit present.     Mental Status: She is alert and oriented to person, place, and time.  Psychiatric:        Mood and Affect: Mood normal.       Lab Results Lab Results   Component Value Date   WBC 7.7 04/07/2023   HGB 8.8 (L) 04/07/2023   HCT 27.2 (L) 04/07/2023   MCV 86.6 04/07/2023   PLT 522 (H) 04/07/2023    Lab Results  Component Value Date   CREATININE 0.58 04/07/2023   BUN <5 (L) 04/07/2023   NA 132 (L) 04/07/2023   K 3.5 04/07/2023   CL 101 04/07/2023   CO2 25 04/07/2023    Lab Results  Component Value Date   ALT 18 04/04/2023   AST 25 04/04/2023   ALKPHOS 118 04/04/2023   BILITOT 0.5 04/04/2023        Danelle Earthly, MD [facility] 04/07/2023, 2:20 PM I have personally spent 52 minutes involved in face-to-face and non-face-to-face activities for this patient  on the day of the visit. Professional time spent includes the following activities: Preparing to see the patient (review of tests), Obtaining and/or reviewing separately obtained history (admission/discharge record), Performing a medically appropriate examination and/or evaluation , Ordering medications/tests/procedures, referring and communicating with other health care professionals, Documenting clinical information in the EMR, Independently interpreting results (not separately reported), Communicating results to the patient/family/caregiver, Counseling and educating the patient/family/caregiver and Care coordination (not separately reported).

## 2023-04-08 ENCOUNTER — Inpatient Hospital Stay (HOSPITAL_COMMUNITY): Payer: Medicaid Other | Admitting: Anesthesiology

## 2023-04-08 ENCOUNTER — Encounter (HOSPITAL_COMMUNITY)
Admission: EM | Disposition: A | Discharge status: Home / Self Care | Payer: Self-pay | Source: Home / Self Care | Attending: Family Medicine

## 2023-04-08 ENCOUNTER — Encounter (HOSPITAL_COMMUNITY): Payer: Self-pay | Admitting: Student

## 2023-04-08 DIAGNOSIS — D62 Acute posthemorrhagic anemia: Secondary | ICD-10-CM | POA: Diagnosis not present

## 2023-04-08 DIAGNOSIS — N179 Acute kidney failure, unspecified: Secondary | ICD-10-CM | POA: Diagnosis not present

## 2023-04-08 DIAGNOSIS — K624 Stenosis of anus and rectum: Secondary | ICD-10-CM

## 2023-04-08 DIAGNOSIS — R6521 Severe sepsis with septic shock: Secondary | ICD-10-CM | POA: Diagnosis not present

## 2023-04-08 DIAGNOSIS — A419 Sepsis, unspecified organism: Secondary | ICD-10-CM | POA: Diagnosis not present

## 2023-04-08 DIAGNOSIS — K5730 Diverticulosis of large intestine without perforation or abscess without bleeding: Secondary | ICD-10-CM

## 2023-04-08 DIAGNOSIS — K626 Ulcer of anus and rectum: Secondary | ICD-10-CM | POA: Diagnosis not present

## 2023-04-08 DIAGNOSIS — K625 Hemorrhage of anus and rectum: Secondary | ICD-10-CM | POA: Diagnosis not present

## 2023-04-08 HISTORY — PX: BIOPSY: SHX5522

## 2023-04-08 HISTORY — PX: FLEXIBLE SIGMOIDOSCOPY: SHX5431

## 2023-04-08 LAB — CULTURE, BLOOD (ROUTINE X 2)
Culture: NO GROWTH
Culture: NO GROWTH
Special Requests: ADEQUATE
Special Requests: ADEQUATE

## 2023-04-08 LAB — CBC WITH DIFFERENTIAL/PLATELET
Abs Immature Granulocytes: 0.04 10*3/uL (ref 0.00–0.07)
Basophils Absolute: 0.1 10*3/uL (ref 0.0–0.1)
Basophils Relative: 1 %
Eosinophils Absolute: 0.7 10*3/uL — ABNORMAL HIGH (ref 0.0–0.5)
Eosinophils Relative: 9 %
HCT: 26.8 % — ABNORMAL LOW (ref 36.0–46.0)
Hemoglobin: 8.4 g/dL — ABNORMAL LOW (ref 12.0–15.0)
Immature Granulocytes: 1 %
Lymphocytes Relative: 37 %
Lymphs Abs: 2.8 10*3/uL (ref 0.7–4.0)
MCH: 27.2 pg (ref 26.0–34.0)
MCHC: 31.3 g/dL (ref 30.0–36.0)
MCV: 86.7 fL (ref 80.0–100.0)
Monocytes Absolute: 0.5 10*3/uL (ref 0.1–1.0)
Monocytes Relative: 6 %
Neutro Abs: 3.6 10*3/uL (ref 1.7–7.7)
Neutrophils Relative %: 46 %
Platelets: 491 10*3/uL — ABNORMAL HIGH (ref 150–400)
RBC: 3.09 MIL/uL — ABNORMAL LOW (ref 3.87–5.11)
RDW: 14.6 % (ref 11.5–15.5)
WBC: 7.6 10*3/uL (ref 4.0–10.5)
nRBC: 0 % (ref 0.0–0.2)

## 2023-04-08 LAB — BASIC METABOLIC PANEL
Anion gap: 7 (ref 5–15)
BUN: <5 mg/dL — ABNORMAL LOW (ref 6–20)
CO2: 27 mmol/L (ref 22–32)
Calcium: 8.5 mg/dL — ABNORMAL LOW (ref 8.9–10.3)
Chloride: 99 mmol/L (ref 98–111)
Creatinine, Ser: 0.56 mg/dL (ref 0.44–1.00)
GFR, Estimated: >60 mL/min (ref >60)
Glucose, Bld: 83 mg/dL (ref 70–99)
Potassium: 3.2 mmol/L — ABNORMAL LOW (ref 3.5–5.1)
Sodium: 133 mmol/L — ABNORMAL LOW (ref 135–145)

## 2023-04-08 LAB — MAGNESIUM: Magnesium: 1.9 mg/dL (ref 1.7–2.4)

## 2023-04-08 SURGERY — SIGMOIDOSCOPY, FLEXIBLE
Anesthesia: General

## 2023-04-08 MED ORDER — POTASSIUM CHLORIDE 10 MEQ/100ML IV SOLN
10.0000 meq | INTRAVENOUS | Status: AC
Start: 2023-04-08 — End: 2023-04-08
  Administered 2023-04-08 (×2): 10 meq via INTRAVENOUS
  Filled 2023-04-08 (×2): qty 100

## 2023-04-08 MED ORDER — POTASSIUM CHLORIDE 10 MEQ/100ML IV SOLN
10.0000 meq | INTRAVENOUS | Status: AC
  Administered 2023-04-08: 10 meq via INTRAVENOUS
  Filled 2023-04-08: qty 100

## 2023-04-08 MED ORDER — SODIUM CHLORIDE 0.9 % IV SOLN: INTRAVENOUS | Status: DC | PRN

## 2023-04-08 MED ORDER — SODIUM CHLORIDE 0.9 % IV SOLN
INTRAVENOUS | Status: DC
Start: 2023-04-08 — End: 2023-04-08

## 2023-04-08 MED ORDER — LACTATED RINGERS IV SOLN: INTRAVENOUS | Status: DC

## 2023-04-08 MED ORDER — PROPOFOL 10 MG/ML IV BOLUS
INTRAVENOUS | Status: DC | PRN
  Administered 2023-04-08: 180 mg via INTRAVENOUS

## 2023-04-08 MED ORDER — HYDROCORTISONE 100 MG/60ML RE ENEM
100.0000 mg | ENEMA | Freq: Every day | RECTAL | Status: DC
  Administered 2023-04-09: 100 mg via RECTAL
  Filled 2023-04-08 (×3): qty 1

## 2023-04-08 NOTE — Plan of Care (Signed)
  Problem: Education: Goal: Knowledge of General Education information will improve Description: Including pain rating scale, medication(s)/side effects and non-pharmacologic comfort measures Outcome: Progressing   Problem: Health Behavior/Discharge Planning: Goal: Ability to manage health-related needs will improve Outcome: Progressing   Problem: Clinical Measurements: Goal: Ability to maintain clinical measurements within normal limits will improve Outcome: Progressing Goal: Will remain free from infection Outcome: Progressing Goal: Diagnostic test results will improve Outcome: Progressing Goal: Respiratory complications will improve Outcome: Progressing Goal: Cardiovascular complication will be avoided Outcome: Progressing   Problem: Activity: Goal: Risk for activity intolerance will decrease Outcome: Progressing   Problem: Nutrition: Goal: Adequate nutrition will be maintained Outcome: Progressing   Problem: Coping: Goal: Level of anxiety will decrease Outcome: Progressing   Problem: Elimination: Goal: Will not experience complications related to bowel motility Outcome: Progressing Goal: Will not experience complications related to urinary retention Outcome: Progressing   Problem: Pain Management: Goal: General experience of comfort will improve Outcome: Progressing   Problem: Safety: Goal: Ability to remain free from injury will improve Outcome: Progressing   Problem: Skin Integrity: Goal: Risk for impaired skin integrity will decrease Outcome: Progressing   Problem: Fluid Volume: Goal: Hemodynamic stability will improve Outcome: Progressing   Problem: Clinical Measurements: Goal: Diagnostic test results will improve Outcome: Progressing Goal: Signs and symptoms of infection will decrease Outcome: Progressing   Problem: Respiratory: Goal: Ability to maintain adequate ventilation will improve Outcome: Progressing

## 2023-04-08 NOTE — Anesthesia Postprocedure Evaluation (Signed)
Anesthesia Post Note  Patient: [name]  Procedure(s) Performed: FLEXIBLE SIGMOIDOSCOPY BIOPSY  Patient location during evaluation: PACU Anesthesia Type: General Level of consciousness: awake and alert Pain management: pain level controlled Vital Signs Assessment: post-procedure vital signs reviewed and stable Respiratory status: spontaneous breathing, nonlabored ventilation, respiratory function stable and patient connected to nasal cannula oxygen Cardiovascular status: blood pressure returned to baseline and stable Postop Assessment: no apparent nausea or vomiting Anesthetic complications: no   No notable events documented.   Last Vitals:  Vitals:   04/08/23 1727 04/08/23 1730  BP: (!) 67/47 (!) 82/37  Pulse: 93 89  Resp: (!) 22 18  Temp: 37.2 C   SpO2: 96% 97%    Last Pain:  Vitals:   04/08/23 1730  TempSrc:   PainSc: 0-No pain                 Windell Norfolk

## 2023-04-08 NOTE — Progress Notes (Signed)
PROGRESS NOTE   [name]  WJX:[id number] DOB: May 28, 1970 DOA: 04/03/2023 PCP: [name], PA-C   No chief complaint on file.  Level of care: Telemetry  Brief Admission History:  52 y.o. female with medical history significant of recent colectomy with Dr. 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   She reports that she was initially in her usual state of health but after arrival to the ED she began having generalized abdominal pain.  She states that her stomach pain is located in the lower portion of her stomach and is fairly diffuse.  She does not recall any alleviating or aggravating factors given that abdominal pain began right after coming to the ED. she was admitted with septic shock, POA with likely intra-abdominal source.  ID consulted for assistance in patient was started on vancomycin and Rocephin and continued on micafungin due to her ongoing fungemia with Candida glabrata.   Assessment and Plan:  Septic Shock, possibly related to stoma site infection- POA Fungemia 2/2 C. Glabrata - POA Abdominal pain -Appreciate ID recommendations with plans to continue vancomycin, Rocephin, and Flagyl  -Micafungin to continue through 12/12 per ID service unless BC positive from this admission  -Started on oral vancomycin twice daily for secondary prophylaxis while on IV antibiotics given history of C. Difficile, now transitioned to doxy and cefadroxil to complete total of 10 days of abx  -Blood and fungal cultures with no growth noted thus far -She has been weaned off of  norepinephrine at this point, but continues to have soft blood pressure readings which are approximately 90/50 at baseline per patient -ID made arrangements for her to be approved for Cresemba -pt to follow up with Dr. Daiva Eves on 04/11/23 already scheduled   Hyponatremia-improving -Check BMP -Urine osmolality and sodium levels are low, follow-up serum osmolality -Improved with use of normal saline   Severe hypomagnesemia -improving, give additional IV dose today -repleted     Worsening anemia -I have asked GI team for consultation given guaiac positive stools -Anemia panel negative -Continue to follow CBC and transfuse for hemoglobin less than 7 -Hold apixaban -flex sig planned for 12/5 per GI service   AKI-resolved Cr 2.79 on admission, baseline appears to be around 0.6-0.7. Likely in the setting of septic shock and volume depletion. She has received 3L IVF boluses and I have started her on LR @100cc /hr for 12 hours.   Refractory Crohn's disease Recent history of fulminant C. difficile colitis  Status post colectomy (02/2023, Dr. Luisa Hart) Patient has ran out of her colostomy supplies and will need additional supplies at discharge.  It does not appear that she is currently on any medications for her Crohn's disease. -Colostomy supplies -Will need to follow-up with [facility] for further recommendations in regards to Crohns disease -Oral vancomycin per ID -Has high amounts of liquid stool output from ostomy site -appreciate ostomy nurse consultation    Rectal bleeding - GI service consulted due to reports of rectal clots from patient and hemoccult positive stool - apixaban currently being held   Type 2 diabetes - diet controlled  Does not appear to be on any medications for this at home.  Last A1c 5.8% 2 months ago.   Hypotension -States blood pressures are typically 90/50 at home, should not require antihypertensives -Holding home antihypertensives in the setting of septic  shock -Weaned OFF norepinephrine -midodrine for BP support    History of DVT/PE Noted during recent hospitalization in 02/2023.  She is on Eliquis at home and will continue during hospitalization. -Holding apixaban for now due to GI bleeding and guaiac positive stools   Thrombocytosis Noted during prior hospitalization as well.  Evaluated by hematology and felt to be inflammatory.  Studies at that time included BCR-ABL-1, CML/ALL PCR, JAK2 which were all negative.  Will continue to monitor   Hx of Cardiac arrest (01/2023) -telemetry   Seizure disorder -Continue home Keppra and gabapentin   GERD -Continue home PPI   COPD Not in exacerbation. -Continue LABA/ICS   Anxiety Patient is on Xanax 1 mg 3 times daily as needed and triazolam 0.25 mg daily.  Reviewed PDMP, she is appropriately obtaining Xanax and triazolam.  Will continue these medications while here at slightly reduced doses. -Reduced Xanax to 0.5 mg 2 times daily as needed -Continue home triazolam 0.25 mg daily   DVT prophylaxis: apixaban on hold (SCDs) Code Status: Full Family Communication:  Disposition: anticipate home in 1-2 days   Consultants:  I.D. GI   Subjective: Pt agreeable to proceed with procedure today.  No specific complaints.     Objective: Vitals:   04/08/23 0339 04/08/23 1000 04/08/23 1340 04/08/23 1431  BP: 120/68 106/68 (!) 96/57 (!) 101/51  Pulse: 99 (!) 113 92 92  Resp: 18  18 16   Temp: 98.2 F (36.8 C)  98.1 F (36.7 C) 98.1 F (36.7 C)  TempSrc: Oral  Oral Oral  SpO2: 99%  98% 99%  Weight:    58.8 kg  Height:    5\' 3"  (1.6 m)    Intake/Output Summary (Last 24 hours) at 04/08/2023 1444 Last data filed at 04/08/2023 1341 Gross per 24 hour  Intake 358.63 ml  Output 1050 ml  Net -691.37 ml   Filed Weights   04/03/23 1115 04/04/23 0545 04/08/23 1431  Weight: 59 kg 58.8 kg 58.8 kg   Examination:  General exam: Appears chronically ill but no acute distress.  Respiratory  system: Clear to auscultation. Respiratory effort normal. Cardiovascular system: normal S1 & S2 heard. No JVD, murmurs, rubs, gallops or clicks. No pedal edema. Gastrointestinal system: Abdomen is mildly distended, ostomy appears normal with watery brown stool seen.  Abd very tender to light palpation diffusely. No organomegaly or masses felt. hypoactive bowel sounds heard. Central nervous system: Alert and oriented. No focal neurological deficits. Extremities: Symmetric 5 x 5 power. Skin: No rashes, lesions or ulcers. Psychiatry: Judgement and insight appear normal. Mood & affect appropriate.   Data Reviewed: I have personally reviewed following labs and imaging studies  CBC: Recent Labs  Lab 04/03/23 1130 04/04/23 0312 04/05/23 0421 04/06/23 0448 04/07/23 0758 04/08/23 0442  WBC 12.2* 10.9* 7.9 7.4 7.7 7.6  NEUTROABS 8.7*  --   --   --   --  3.6  HGB 13.3 9.4* 8.3* 8.0* 8.8* 8.4*  HCT 39.0 28.1* 26.2* 25.9* 27.2* 26.8*  MCV 82.1 83.1 86.8 86.6 86.6 86.7  PLT 834* 683* 553* 535* 522* 491*    Basic Metabolic Panel: Recent Labs  Lab 04/04/23 0312 04/05/23 0421 04/06/23 0448 04/07/23 0758 04/08/23 0442  NA 129* 133* 132* 132* 133*  K 3.3* 3.7 3.3* 3.5 3.2*  CL 94* 100 100  101 99  CO2 22 22 24 25 27   GLUCOSE 115* 87 91 92 83  BUN 23* 9 <5* <5* <5*  CREATININE 1.38* 0.65 0.56 0.58 0.56  CALCIUM 7.6* 7.5* 7.5* 8.2* 8.5*  MG  --  0.6* 1.3* 1.7 1.9    CBG: No results for input(s): "GLUCAP" in the last 168 hours.  Recent Results (from the past 240 hour(s))  Blood Culture (routine x 2)     Status: None   Collection Time: 04/03/23 11:30 AM   Specimen: BLOOD RIGHT ARM  Result Value Ref Range Status   Specimen Description   Final    BLOOD RIGHT ARM BOTTLES DRAWN AEROBIC AND ANAEROBIC   Special Requests Blood Culture adequate volume  Final   Culture   Final    NO GROWTH 5 DAYS Performed at [facility], [address], [place], [place] [address]    Report Status  04/08/2023 FINAL  Final  Blood Culture (routine x 2)     Status: None   Collection Time: 04/03/23 11:35 AM   Specimen: BLOOD LEFT ARM  Result Value Ref Range Status   Specimen Description BLOOD LEFT ARM BOTTLES DRAWN AEROBIC AND ANAEROBIC  Final   Special Requests Blood Culture adequate volume  Final   Culture   Final    NO GROWTH 5 DAYS Performed at [facility], [address], [place], [place] [address]    Report Status 04/08/2023 FINAL  Final  MRSA Next Gen by PCR, Nasal     Status: None   Collection Time: 04/03/23  1:57 PM   Specimen: Nasal Mucosa; Nasal Swab  Result Value Ref Range Status   MRSA by PCR Next Gen NOT DETECTED NOT DETECTED Final    Comment: (NOTE) The GeneXpert MRSA Assay (FDA approved for NASAL specimens only), is one component of a comprehensive MRSA colonization surveillance program. It is not intended to diagnose MRSA infection nor to guide or monitor treatment for MRSA infections. Test performance is not FDA approved in patients less than 73 years old. Performed at [facility], [address] [place], [place] [address]   Fungus culture, blood     Status: None (Preliminary result)   Collection Time: 04/03/23  5:49 PM   Specimen: BLOOD RIGHT HAND  Result Value Ref Range Status   Specimen Description BLOOD RIGHT HAND  Final   Special Requests   Final    BOTTLES DRAWN AEROBIC ONLY Blood Culture results may not be optimal due to an inadequate volume of blood received in culture bottles   Culture   Final    NO GROWTH 5 DAYS Performed at [facility], [address], [place], [place] [address]    Report Status PENDING  Incomplete  Gastrointestinal Panel by PCR , Stool     Status: None   Collection Time: 04/05/23  1:00 PM   Specimen: Stool  Result Value Ref Range Status   Campylobacter species NOT DETECTED NOT DETECTED Final   Plesimonas shigelloides NOT DETECTED NOT DETECTED Final   Salmonella species NOT DETECTED NOT DETECTED Final   Yersinia  enterocolitica NOT DETECTED NOT DETECTED Final   Vibrio species NOT DETECTED NOT DETECTED Final   Vibrio cholerae NOT DETECTED NOT DETECTED Final   Enteroaggregative E coli (EAEC) NOT DETECTED NOT DETECTED Final   Enteropathogenic E coli (EPEC) NOT DETECTED NOT DETECTED Final   Enterotoxigenic E coli (ETEC) NOT DETECTED NOT DETECTED Final   Shiga like toxin producing E coli (STEC) NOT DETECTED NOT DETECTED Final  Shigella/Enteroinvasive E coli (EIEC) NOT DETECTED NOT DETECTED Final   Cryptosporidium NOT DETECTED NOT DETECTED Final   Cyclospora cayetanensis NOT DETECTED NOT DETECTED Final   Entamoeba histolytica NOT DETECTED NOT DETECTED Final   Giardia lamblia NOT DETECTED NOT DETECTED Final   Adenovirus F40/41 NOT DETECTED NOT DETECTED Final   Astrovirus NOT DETECTED NOT DETECTED Final   Norovirus GI/GII NOT DETECTED NOT DETECTED Final   Rotavirus A NOT DETECTED NOT DETECTED Final   Sapovirus (I, II, IV, and V) NOT DETECTED NOT DETECTED Final    Comment: Performed at [facility], [address] [place], [place] [address]  C Difficile Quick Screen w PCR reflex     Status: None   Collection Time: 04/05/23  1:00 PM   Specimen: Stool  Result Value Ref Range Status   C Diff antigen NEGATIVE NEGATIVE Final   C Diff toxin NEGATIVE NEGATIVE Final   C Diff interpretation No C. difficile detected.  Final    Comment: Performed at [facility], [address] [place], [place] [address]     Radiology Studies: No results found.  Scheduled Meds:  [MAR Hold] amitriptyline  100 mg Oral QHS   [MAR Hold] cefadroxil  500 mg Oral BID   [MAR Hold] Chlorhexidine Gluconate Cloth  6 each Topical Daily   [MAR Hold] cycloSPORINE  1 drop Both Eyes BID   [MAR Hold] diphenoxylate-atropine  1 tablet Oral QID   [MAR Hold] doxycycline  100 mg Oral Q12H   [MAR Hold] feeding supplement  237 mL Oral BID BM   [MAR Hold] fentaNYL  1 patch Transdermal Q72H   [MAR Hold] fluticasone furoate-vilanterol   1 puff Inhalation Daily   [MAR Hold] folic acid  1 mg Oral Daily   [MAR Hold] gabapentin  300 mg Oral BID   [MAR Hold] levETIRAcetam  750 mg Oral BID   [MAR Hold] loratadine  10 mg Oral Daily   [MAR Hold] metoprolol tartrate  12.5 mg Oral BID   [MAR Hold] metroNIDAZOLE  500 mg Oral Q12H   [MAR Hold] midodrine  5 mg Oral TID WC   [MAR Hold] pantoprazole  40 mg Oral Daily   [MAR Hold] polycarbophil  1,250 mg Oral BID   [MAR Hold] sodium chloride flush  10 mL Intravenous Q12H   [MAR Hold] thiamine  100 mg Oral Daily   [MAR Hold] vancomycin  125 mg Oral BID   [MAR Hold] Vitamin D (Ergocalciferol)  50,000 Units Oral Weekly   Continuous Infusions:  sodium chloride     lactated ringers     [MAR Hold] micafungin (MYCAMINE) 150 mg in sodium chloride 0.9 % 100 mL IVPB Stopped (04/07/23 1403)     LOS: 5 days   Time spent: 40 mins  Jayvan Mcshan Laural Benes, MD How to contact the [facility] Attending or Consulting provider 7A - 7P or covering provider during after hours 7P -7A, for this patient?  Check the care team in [facility] and look for a) attending/consulting [facility] provider listed and b) the [facility] team listed Log into [email or web address] to find provider on call.  Locate the [facility] provider you are looking for under [facility] and page to a number that you can be directly reached. If you still have difficulty reaching the provider, please page the [facility] (Director on Call) for the Hospitalists listed on amion for assistance.  04/08/2023, 2:44 PM

## 2023-04-08 NOTE — Transfer of Care (Signed)
Immediate Anesthesia Transfer of Care Note  Patient: [name]  Procedure(s) Performed: FLEXIBLE SIGMOIDOSCOPY BIOPSY  Patient Location: PACU  Anesthesia Type:General  Level of Consciousness: awake and alert   Airway & Oxygen Therapy: Patient Spontanous Breathing  Post-op Assessment: Report given to RN and Post -op Vital signs reviewed and stable  Post vital signs: Reviewed and stable  Last Vitals:  Vitals Value Taken Time  BP 67/47 04/08/23 1733  Temp 37.2 C 04/08/23 1727  Pulse 96 04/08/23 1737  Resp 24 04/08/23 1737  SpO2 97 % 04/08/23 1737  Vitals shown include unfiled device data.  Last Pain:  Vitals:   04/08/23 1727  TempSrc:   PainSc: 0-No pain      Patients Stated Pain Goal: 0 (04/06/23 1300)  Complications: No notable events documented.

## 2023-04-08 NOTE — Interval H&P Note (Signed)
      History and Physical Interval Note:  04/08/2023 5:15 PM  [name]  has presented today for surgery, with the diagnosis of rectal bleeding, crohn's colitis with colectomy with ileostomy and rectal stump.  The various methods of treatment have been discussed with the patient and family. After consideration of risks, benefits and other options for treatment, the patient has consented to  Procedure(s): FLEXIBLE SIGMOIDOSCOPY (N/A) as a surgical intervention.  The patient's history has been reviewed, patient examined, no change in status, stable for surgery.  I have reviewed the patient's chart and labs.  Questions were answered to the patient's satisfaction.     [name]   patient seen and examined in short stay.  Hemoglobin stable at 8.4.  Agree with need for limited sigmoidoscopy to further evaluate rectal bleeding. The risks, benefits, limitations, alternatives and imponderables have been reviewed with the patient. Questions have been answered. All parties are agreeable.

## 2023-04-08 NOTE — Consult Note (Signed)
WOC team in to visit with patient for further assessment/education.  Patients bedside nurse A. Ray has placed a  2 3/4" skin barrier and high output pouch on earlier in shift that is intact.  I did not remove pouch at this visit.    Nargis and I practiced her snapping a 2 piece pouching system together.  She said she had some of these at home but did not know how to use.  We discussed use of the high output pouch and that if the stool is thin enough she can hook to bedside drainage bag to help stay empty.  Per nursing staff stool has been too thick to pass into a foley bag today.  Even if not able to hook to bedside drainage bag the high output pouch will hold more liquid stool than a standard lock and roll pouch.  I also demonstrated again burping of pouch when full of gas as this was an issue earlier in shift.  We again discussed and demonstrated how to crust skin with no sting barrier wipe and stoma powder.  I personally went to materials and obtained stoma powder as the bottle given earlier in the week could not be found.   Patient has (3) 2 3/4" skin barriers and high output pouches in room, (2) 2 1/4" skin barriers and high output pouches in room and (4) 1 piece convex pouches. Also has multiple 2" barrier rings.  I also secured an ostomy belt on patient at this time for her to try and see if this will help pouch stay in place.  Patient seems much more alert, appears to have better grasp of steps and supplies needed to manage ileostomy at home.  Again reviewed ostomy clinic and that Medicaid will help provide transportation to clinic. At this visit patient states she has 2 cars and can drive herself to appointments.  On previous visits she has expressed concern that she did not have transportation.  Also discussed obtaining supplies through [facility] and patient has been given handout with this information.    Overall, patient appears in a better position to care for ileostomy at discharge.    Thank  you,    Priscella Mann MSN, RN-BC, [employer] [phone]

## 2023-04-08 NOTE — Progress Notes (Signed)
Pt returned to room via bed from day hospital s/p flex sigmoidoscopy. Pt A&O, denies c/o. Pt drinking soda with no n/v. Requesting supper meal, tray requested from dietary. VSS. IV site L) AC noted to be infiltrated and leaking. Site removed and bandage applied.

## 2023-04-08 NOTE — Interval H&P Note (Signed)
History and Physical Interval Note:  04/08/2023 5:15 PM  [name]  has presented today for surgery, with the diagnosis of rectal bleeding, crohn's colitis with colectomy with ileostomy and rectal stump.  The various methods of treatment have been discussed with the patient and family. After consideration of risks, benefits and other options for treatment, the patient has consented to  Procedure(s): FLEXIBLE SIGMOIDOSCOPY (N/A) as a surgical intervention.  The patient's history has been reviewed, patient examined, no change in status, stable for surgery.  I have reviewed the patient's chart and labs.  Questions were answered to the patient's satisfaction.     [name]

## 2023-04-08 NOTE — Plan of Care (Signed)

## 2023-04-08 NOTE — Anesthesia Preprocedure Evaluation (Signed)
Anesthesia Evaluation  Patient identified by MRN, date of birth, ID band Patient awake    Reviewed: Allergy & Precautions, H&P , NPO status , Patient's Chart, lab work & pertinent test results, reviewed documented beta blocker date and time   Airway Mallampati: II  TM Distance: >3 FB Neck ROM: Full    Dental no notable dental hx. (+) Teeth Intact, Dental Advisory Given   Pulmonary asthma , COPD,  COPD inhaler, Current Smoker   Pulmonary exam normal breath sounds clear to auscultation       Cardiovascular hypertension, Pt. on medications and Pt. on home beta blockers + DOE  Normal cardiovascular exam Rhythm:Regular Rate:Normal     Neuro/Psych Seizures -, Well Controlled,   Anxiety Depression       GI/Hepatic Neg liver ROS,GERD  Medicated,,  Endo/Other  diabetes, Insulin Dependent    Renal/GU Renal disease  negative genitourinary   Musculoskeletal  (+) Arthritis , Osteoarthritis,    Abdominal   Peds  Hematology  (+) Blood dyscrasia, anemia   Anesthesia Other Findings   Reproductive/Obstetrics negative OB ROS                              Anesthesia Physical Anesthesia Quick Evaluation

## 2023-04-08 NOTE — Op Note (Signed)
[facility] Patient Name: [name] Procedure Date: 04/08/2023 4:54 PM MRN: [id number] Date of Birth: 07/25/1970 Attending MD: Gennette Pac , MD, [id number] CSN: [id number] Age: 52 Admit Type: Outpatient Procedure:                Flexible Sigmoidoscopy Indications:              Hematochezia Providers:                Gennette Pac, MD, Buel Ream. Thomasena Edis RN, RN,                            Durwin Glaze Tech, Technician Referring MD:              Medicines:                Propofol per Anesthesia Complications:            No immediate complications. Estimated Blood Loss:     Estimated blood loss was minimal. Procedure:                Pre-Anesthesia Assessment:                           - Prior to the procedure, a History and Physical                            was performed, and patient medications and                            allergies were reviewed. The patient's tolerance of                            previous anesthesia was also reviewed. The risks                            and benefits of the procedure and the sedation                            options and risks were discussed with the patient.                            All questions were answered, and informed consent                            was obtained. ASA Grade Assessment: III - A patient                            with severe systemic disease. After reviewing the                            risks and benefits, the patient was deemed in                            satisfactory condition to undergo the procedure.  After obtaining informed consent, the scope was                            passed under direct vision. The [id number])                            scope was introduced through the anus and advanced                            to the the rectum. The flexible sigmoidoscopy was                            accomplished without difficulty. The patient                             tolerated the procedure well. The quality of the                            bowel preparation was adequate. Scope In: 5:20:45 PM Scope Out: 5:24:53 PM Total Procedure Duration: 0 hours 4 minutes 8 seconds  Findings:      DRE revealed blood on examiner's finger and mild short rectal stricture.       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 Touch friability. Biopsies       taken. Please see photos. Impression:               - Diffuse proctitis involving the rectal stump                            benign-appearing rectal stricture. Biopsies taken.                            Differential includes active inflammatory bowel                            disease +/- diversion colitis. Moderate Sedation:      Moderate (conscious) sedation was personally administered by an       anesthesia professional. The following parameters were monitored: oxygen       saturation, heart rate, blood pressure, respiratory rate, EKG, adequacy       of pulmonary ventilation, and response to care. Recommendation:           - Advance diet as tolerated. Home tomorrow if                            stable. Begin cortisone enemas nightly empirically.                            Follow-up on pathology. If histology suggest  diversion colitis will add butyrate enemas. Procedure Code(s):        --- Professional ---                           [phone], 52, Sigmoidoscopy, flexible; diagnostic,                            including collection of specimen(s) by brushing or                            washing, when performed (separate procedure) Diagnosis Code(s):        --- Professional ---                            K92.1, Melena (includes Hematochezia) CPT copyright 2022 American Medical Association. All rights reserved. The codes documented in this report are preliminary and upon coder review may  be revised to meet current compliance requirements. Gerrit Friends. Joniyah Mallinger, MD Gennette Pac, MD 04/08/2023 5:35:06 PM This report has been signed electronically. Number of Addenda: 0

## 2023-04-08 NOTE — Progress Notes (Signed)
Pt transported down to Day Surgery for scheduled procedure.

## 2023-04-09 DIAGNOSIS — K626 Ulcer of anus and rectum: Secondary | ICD-10-CM | POA: Diagnosis not present

## 2023-04-09 DIAGNOSIS — R6521 Severe sepsis with septic shock: Secondary | ICD-10-CM | POA: Diagnosis not present

## 2023-04-09 DIAGNOSIS — K625 Hemorrhage of anus and rectum: Secondary | ICD-10-CM | POA: Diagnosis not present

## 2023-04-09 DIAGNOSIS — A419 Sepsis, unspecified organism: Secondary | ICD-10-CM | POA: Diagnosis not present

## 2023-04-09 DIAGNOSIS — E871 Hypo-osmolality and hyponatremia: Secondary | ICD-10-CM | POA: Diagnosis not present

## 2023-04-09 LAB — CBC WITH DIFFERENTIAL/PLATELET
Abs Immature Granulocytes: 0.03 10*3/uL (ref 0.00–0.07)
Basophils Absolute: 0.1 10*3/uL (ref 0.0–0.1)
Basophils Relative: 1 %
Eosinophils Absolute: 0.7 10*3/uL — ABNORMAL HIGH (ref 0.0–0.5)
Eosinophils Relative: 10 %
HCT: 25.8 % — ABNORMAL LOW (ref 36.0–46.0)
Hemoglobin: 8.1 g/dL — ABNORMAL LOW (ref 12.0–15.0)
Immature Granulocytes: 0 %
Lymphocytes Relative: 38 %
Lymphs Abs: 3 10*3/uL (ref 0.7–4.0)
MCH: 27.7 pg (ref 26.0–34.0)
MCHC: 31.4 g/dL (ref 30.0–36.0)
MCV: 88.4 fL (ref 80.0–100.0)
Monocytes Absolute: 0.5 10*3/uL (ref 0.1–1.0)
Monocytes Relative: 7 %
Neutro Abs: 3.4 10*3/uL (ref 1.7–7.7)
Neutrophils Relative %: 44 %
Platelets: 536 10*3/uL — ABNORMAL HIGH (ref 150–400)
RBC: 2.92 MIL/uL — ABNORMAL LOW (ref 3.87–5.11)
RDW: 15.1 % (ref 11.5–15.5)
WBC: 7.7 10*3/uL (ref 4.0–10.5)
nRBC: 0 % (ref 0.0–0.2)

## 2023-04-09 NOTE — Progress Notes (Signed)
Without complaints.  Tolerating diet.  Still notes some blood per rectum  Vital signs in last 24 hours: Temp:  [98.1 F (36.7 C)-98.9 F (37.2 C)] 98.3 F (36.8 C) (12/07 0331) Pulse Rate:  [89-113] 100 (12/07 0331) Resp:  [16-22] 18 (12/07 0331) BP: (67-106)/(37-68) 92/53 (12/07 0331) SpO2:  [96 %-100 %] 96 % (12/07 0331) Weight:  [58.8 kg] 58.8 kg (12/06 1431) Last BM Date : 04/09/23 General:   Alert,   pleasant and cooperative in NAD Abdomen: Nondistended.  Soft and nontender ileostomy looks good right lower quadrant  Intake/Output from previous day: 12/06 0701 - 12/07 0700 In: 877 [P.O.:240; I.V.:220; IV Piggyback:417] Out: -  Intake/Output this shift: No intake/output data recorded.  Lab Results: Recent Labs    04/07/23 0758 04/08/23 0442 04/09/23 0326  WBC 7.7 7.6 7.7  HGB 8.8* 8.4* 8.1*  HCT 27.2* 26.8* 25.8*  PLT 522* 491* 536*   BMET Recent Labs    04/07/23 0758 04/08/23 0442  NA 132* 133*  K 3.5 3.2*  CL 101 99  CO2 25 27  GLUCOSE 92 83  BUN <5* <5*  CREATININE 0.58 0.56  CALCIUM 8.2* 8.5*   Impression:  Complicated 52 year old lady with longstanding stricturing Crohn's colitis complicated by recent C. Difficile, ischemic colon, cardiac arrest.  Underwent total colectomy leaving about 15 cm of rectal stump.  Permanent ileostomy created.  Postop course complicated by fungal bacteremia for which she is actively being managed by ID. We were asked to see for persistent rectal bleeding sigmoidoscopy yesterday demonstrated diffusely raw rectal mucosa with spontaneous oozing and marked touch friability.  Biopsies taken.  Etiology of proctitis may well be more diversion rather than inflammatory bowel disease.  Recommend cortisone enemas be continued 1 nightly for the next 4 weeks.  Follow-up on pathology.    Would prefer if Eliquis not be resumed prior to 12/15 if clinically feasible.  Office follow-up with [place] in 2 weeks  At this time, patient does not  need any systemic therapy for inflammatory bowel disease.  Signing off.  Please call if any questions.

## 2023-04-09 NOTE — Plan of Care (Signed)
Patient remains on AP-300 at time of writing. Patient is AA+Ox4 and managing the needs of her ostomy independently. The patient has a right [place] 2-lumen CVC with one port occluded; the other port returns blood briskly. Of note, the patient has been receiving IV antibiotics while in the hospital; however, upon change of shift, it is noted by this RN that the IV tubing loaded into the IV pump at the patient's bedside is unlabeled, uncapped, and lying across the floor. The IV tubing is discarded by this RN. An education assessment was also completed by this RN with the patient; as this had not been completed during this encounter. It is also worth noting the patient keeps as 12-pack of [place]. Dew sodas at her bedside; the issue was discussed between the RN and patient, noting the inherent health risks of poor choices in beverages.   Problem: Education: Goal: Knowledge of General Education information will improve Description: Including pain rating scale, medication(s)/side effects and non-pharmacologic comfort measures Outcome: Progressing   Problem: Health Behavior/Discharge Planning: Goal: Ability to manage health-related needs will improve Outcome: Progressing   Problem: Clinical Measurements: Goal: Ability to maintain clinical measurements within normal limits will improve Outcome: Progressing Goal: Will remain free from infection Outcome: Progressing Goal: Diagnostic test results will improve Outcome: Progressing Goal: Respiratory complications will improve Outcome: Progressing Goal: Cardiovascular complication will be avoided Outcome: Progressing   Problem: Activity: Goal: Risk for activity intolerance will decrease Outcome: Progressing   Problem: Nutrition: Goal: Adequate nutrition will be maintained Outcome: Progressing   Problem: Coping: Goal: Level of anxiety will decrease Outcome: Progressing   Problem: Elimination: Goal: Will not experience complications related to bowel  motility Outcome: Progressing Goal: Will not experience complications related to urinary retention Outcome: Progressing   Problem: Pain Management: Goal: General experience of comfort will improve Outcome: Progressing   Problem: Safety: Goal: Ability to remain free from injury will improve Outcome: Progressing   Problem: Skin Integrity: Goal: Risk for impaired skin integrity will decrease Outcome: Progressing   Problem: Fluid Volume: Goal: Hemodynamic stability will improve Outcome: Progressing   Problem: Clinical Measurements: Goal: Diagnostic test results will improve Outcome: Progressing Goal: Signs and symptoms of infection will decrease Outcome: Progressing   Problem: Respiratory: Goal: Ability to maintain adequate ventilation will improve Outcome: Progressing

## 2023-04-09 NOTE — Progress Notes (Signed)
PROGRESS NOTE   [name]  UEA:[id number] DOB: July 09, 1970 DOA: 04/03/2023 PCP: [name], PA-C   No chief complaint on file.  Level of care: Med-Surg  Brief Admission History:  52 y.o. female with medical history significant of recent colectomy with Dr. 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   She reports that she was initially in her usual state of health but after arrival to the ED she began having generalized abdominal pain.  She states that her stomach pain is located in the lower portion of her stomach and is fairly diffuse.  She does not recall any alleviating or aggravating factors given that abdominal pain began right after coming to the ED. she was admitted with septic shock, POA with likely intra-abdominal source.  ID consulted for assistance in patient was started on vancomycin and Rocephin and continued on micafungin due to her ongoing fungemia with Candida glabrata.   Assessment and Plan:  Septic Shock, possibly related to stoma site infection- POA Fungemia 2/2 C. Glabrata - POA Abdominal pain chronic generalized -Appreciate ID recommendations with plans to continue vancomycin, Rocephin, and Flagyl  -Micafungin to continue through 12/12 per ID service unless BC positive from this admission  -Started on oral vancomycin twice daily for secondary prophylaxis while on IV antibiotics given history of C. Difficile, now transitioned to doxy and cefadroxil to complete total of 10 days of abx  -Blood and fungal cultures with no growth noted thus far -She has been weaned  off of norepinephrine at this point, but continues to have soft blood pressure readings which are approximately 90/50 at baseline per patient -ID made arrangements for her to be approved for Cresemba -pt to follow up with Dr. Daiva Eves on 04/11/23 already scheduled   Hyponatremia-improving -Check BMP -Urine osmolality and sodium levels are low, follow-up serum osmolality -Improved with use of normal saline   Severe hypomagnesemia -improving, give additional IV dose today -repleted     Worsening anemia -I have asked GI team for consultation given guaiac positive stools -Anemia panel negative -Continue to follow CBC and transfuse for hemoglobin less than 7 -Hold apixaban -flex sig planned for 12/5 per GI service   AKI-resolved Cr 2.79 on admission, baseline appears to be around 0.6-0.7. Likely in the setting of septic shock and volume depletion. She has received 3L IVF boluses and I have started her on LR @100cc /hr for 12 hours.   Refractory Crohn's disease Recent history of fulminant C. difficile colitis  Status post colectomy (02/2023, Dr. Luisa Hart) Patient has ran out of her colostomy supplies and will need additional supplies at discharge.  It does not appear that she is currently on any medications for her Crohn's disease. -Colostomy supplies -Will need to follow-up with [facility] for further recommendations in regards to Crohns disease -Oral vancomycin per ID -Has high amounts of liquid stool output from ostomy site -appreciate ostomy nurse consultation  - flex sig completed 12/6: steroid enema nightly for 4 weeks recommended.    Rectal bleeding - GI service consulted due to reports of rectal clots from patient and hemoccult positive stool - apixaban currently being held   Type 2 diabetes - diet  controlled  Does not appear to be on any medications for this at home.  Last A1c 5.8% 2 months ago.   Hypotension -States blood pressures are typically 90/50 at home, should not require  antihypertensives -Holding home antihypertensives in the setting of septic shock -Weaned OFF norepinephrine -midodrine for BP support    History of DVT/PE Noted during recent hospitalization in 02/2023.  She is on Eliquis at home and will continue during hospitalization. -Holding apixaban for now due to GI bleeding and guaiac positive stools   Thrombocytosis Noted during prior hospitalization as well.  Evaluated by hematology and felt to be inflammatory.  Studies at that time included BCR-ABL-1, CML/ALL PCR, JAK2 which were all negative.  Will continue to monitor   Hx of Cardiac arrest (01/2023) -telemetry   Seizure disorder -Continue home Keppra and gabapentin   GERD -Continue home PPI   COPD Not in exacerbation. -Continue LABA/ICS   Anxiety Patient is on Xanax 1 mg 3 times daily as needed and triazolam 0.25 mg daily.  Reviewed PDMP, she is appropriately obtaining Xanax and triazolam.  Will continue these medications while here at slightly reduced doses. -Reduced Xanax to 0.5 mg 2 times daily as needed -Continue home triazolam 0.25 mg daily   DVT prophylaxis: apixaban on hold (SCDs) Code Status: Full Family Communication:  Disposition: anticipate home in 1-2 days   Consultants:  I.D. GI   Subjective: Pt tolerated flex sig yesterday. No difficulty.      Objective: Vitals:   04/08/23 1800 04/08/23 1855 04/08/23 2018 04/09/23 0331  BP: (!) 95/53  100/67 (!) 92/53  Pulse: (!) 102 98 (!) 111 100  Resp: 18  16 18   Temp: 98.1 F (36.7 C)  98.2 F (36.8 C) 98.3 F (36.8 C)  TempSrc: Oral  Oral   SpO2: 100%  100% 96%  Weight:      Height:        Intake/Output Summary (Last 24 hours) at 04/09/2023 1240 Last data filed at 04/09/2023 0900 Gross per 24 hour  Intake 1106.97 ml  Output --  Net 1106.97 ml   Filed Weights   04/03/23 1115 04/04/23 0545 04/08/23 1431  Weight: 59 kg 58.8 kg 58.8 kg   Examination:  General exam: Appears chronically ill but no acute  distress.  Respiratory system: Clear to auscultation. Respiratory effort normal. Cardiovascular system: normal S1 & S2 heard. No JVD, murmurs, rubs, gallops or clicks. No pedal edema. Gastrointestinal system: Abdomen is mildly distended, ostomy appears normal with watery brown stool seen.  Abd very tender to light palpation diffusely. No organomegaly or masses felt. hypoactive bowel sounds heard. Central nervous system: Alert and oriented. No focal neurological deficits. Extremities: Symmetric 5 x 5 power. Skin: No rashes, lesions or ulcers. Psychiatry: Judgement and insight appear normal. Mood & affect appropriate.   Data Reviewed: I have personally reviewed following labs and imaging studies  CBC: Recent Labs  Lab 04/03/23 1130 04/04/23 0312 04/05/23 0421 04/06/23 0448 04/07/23 0758 04/08/23 0442 04/09/23 0326  WBC 12.2*   < > 7.9 7.4 7.7 7.6 7.7  NEUTROABS 8.7*  --   --   --   --  3.6 3.4  HGB 13.3   < > 8.3* 8.0* 8.8* 8.4* 8.1*  HCT 39.0   < > 26.2* 25.9* 27.2* 26.8* 25.8*  MCV 82.1   < > 86.8 86.6 86.6 86.7 88.4  PLT 834*   < > 553* 535* 522* 491* 536*   < > = values in this  interval not displayed.    Basic Metabolic Panel: Recent Labs  Lab 04/04/23 0312 04/05/23 0421 04/06/23 0448 04/07/23 0758 04/08/23 0442  NA 129* 133* 132* 132* 133*  K 3.3* 3.7 3.3* 3.5 3.2*  CL 94* 100 100 101 99  CO2 22 22 24 25 27   GLUCOSE 115* 87 91 92 83  BUN 23* 9 <5* <5* <5*  CREATININE 1.38* 0.65 0.56 0.58 0.56  CALCIUM 7.6* 7.5* 7.5* 8.2* 8.5*  MG  --  0.6* 1.3* 1.7 1.9    CBG: No results for input(s): "GLUCAP" in the last 168 hours.  Recent Results (from the past 240 hour(s))  Blood Culture (routine x 2)     Status: None   Collection Time: 04/03/23 11:30 AM   Specimen: BLOOD RIGHT ARM  Result Value Ref Range Status   Specimen Description   Final    BLOOD RIGHT ARM BOTTLES DRAWN AEROBIC AND ANAEROBIC   Special Requests Blood Culture adequate volume  Final   Culture    Final    NO GROWTH 5 DAYS Performed at [facility], [address], [place], [place] [address]    Report Status 04/08/2023 FINAL  Final  Blood Culture (routine x 2)     Status: None   Collection Time: 04/03/23 11:35 AM   Specimen: BLOOD LEFT ARM  Result Value Ref Range Status   Specimen Description BLOOD LEFT ARM BOTTLES DRAWN AEROBIC AND ANAEROBIC  Final   Special Requests Blood Culture adequate volume  Final   Culture   Final    NO GROWTH 5 DAYS Performed at [facility], [address] [place], [place] [address]    Report Status 04/08/2023 FINAL  Final  MRSA Next Gen by PCR, Nasal     Status: None   Collection Time: 04/03/23  1:57 PM   Specimen: Nasal Mucosa; Nasal Swab  Result Value Ref Range Status   MRSA by PCR Next Gen NOT DETECTED NOT DETECTED Final    Comment: (NOTE) The GeneXpert MRSA Assay (FDA approved for NASAL specimens only), is one component of a comprehensive MRSA colonization surveillance program. It is not intended to diagnose MRSA infection nor to guide or monitor treatment for MRSA infections. Test performance is not FDA approved in patients less than 56 years old. Performed at [facility], [address], [place], [place] [address]   Fungus culture, blood     Status: None (Preliminary result)   Collection Time: 04/03/23  5:49 PM   Specimen: BLOOD RIGHT HAND  Result Value Ref Range Status   Specimen Description BLOOD RIGHT HAND  Final   Special Requests   Final    BOTTLES DRAWN AEROBIC ONLY Blood Culture results may not be optimal due to an inadequate volume of blood received in culture bottles   Culture   Final    NO GROWTH 6 DAYS Performed at [facility], [address], [place], [place] [address]    Report Status PENDING  Incomplete  Gastrointestinal Panel by PCR , Stool     Status: None   Collection Time: 04/05/23  1:00 PM   Specimen: Stool  Result Value Ref Range Status   Campylobacter species NOT DETECTED NOT DETECTED Final   Plesimonas  shigelloides NOT DETECTED NOT DETECTED Final   Salmonella species NOT DETECTED NOT DETECTED Final   Yersinia enterocolitica NOT DETECTED NOT DETECTED Final   Vibrio species NOT DETECTED NOT DETECTED Final   Vibrio cholerae NOT DETECTED NOT DETECTED Final   Enteroaggregative E coli (  EAEC) NOT DETECTED NOT DETECTED Final   Enteropathogenic E coli (EPEC) NOT DETECTED NOT DETECTED Final   Enterotoxigenic E coli (ETEC) NOT DETECTED NOT DETECTED Final   Shiga like toxin producing E coli (STEC) NOT DETECTED NOT DETECTED Final   Shigella/Enteroinvasive E coli (EIEC) NOT DETECTED NOT DETECTED Final   Cryptosporidium NOT DETECTED NOT DETECTED Final   Cyclospora cayetanensis NOT DETECTED NOT DETECTED Final   Entamoeba histolytica NOT DETECTED NOT DETECTED Final   Giardia lamblia NOT DETECTED NOT DETECTED Final   Adenovirus F40/41 NOT DETECTED NOT DETECTED Final   Astrovirus NOT DETECTED NOT DETECTED Final   Norovirus GI/GII NOT DETECTED NOT DETECTED Final   Rotavirus A NOT DETECTED NOT DETECTED Final   Sapovirus (I, II, IV, and V) NOT DETECTED NOT DETECTED Final    Comment: Performed at [facility], [address] [place], [place] [address]  C Difficile Quick Screen w PCR reflex     Status: None   Collection Time: 04/05/23  1:00 PM   Specimen: Stool  Result Value Ref Range Status   C Diff antigen NEGATIVE NEGATIVE Final   C Diff toxin NEGATIVE NEGATIVE Final   C Diff interpretation No C. difficile detected.  Final    Comment: Performed at [facility], [address], [place], [place] [address]     Radiology Studies: No results found.  Scheduled Meds:  amitriptyline  100 mg Oral QHS   cefadroxil  500 mg Oral BID   Chlorhexidine Gluconate Cloth  6 each Topical Daily   cycloSPORINE  1 drop Both Eyes BID   diphenoxylate-atropine  1 tablet Oral QID   doxycycline  100 mg Oral Q12H   feeding supplement  237 mL Oral BID BM   fentaNYL  1 patch Transdermal Q72H   fluticasone  furoate-vilanterol  1 puff Inhalation Daily   folic acid  1 mg Oral Daily   gabapentin  300 mg Oral BID   hydrocortisone  100 mg Rectal QHS   levETIRAcetam  750 mg Oral BID   loratadine  10 mg Oral Daily   metoprolol tartrate  12.5 mg Oral BID   metroNIDAZOLE  500 mg Oral Q12H   midodrine  5 mg Oral TID WC   pantoprazole  40 mg Oral Daily   polycarbophil  1,250 mg Oral BID   sodium chloride flush  10 mL Intravenous Q12H   thiamine  100 mg Oral Daily   vancomycin  125 mg Oral BID   Vitamin D (Ergocalciferol)  50,000 Units Oral Weekly   Continuous Infusions:  micafungin (MYCAMINE) 150 mg in sodium chloride 0.9 % 100 mL IVPB 150 mg (04/09/23 1231)     LOS: 6 days   Time spent: 40 mins  Yesly Gerety Laural Benes, MD How to contact the [facility] Attending or Consulting provider 7A - 7P or covering provider during after hours 7P -7A, for this patient?  Check the care team in [facility] and look for a) attending/consulting [facility] provider listed and b) the [facility] team listed Log into [email or web address] to find provider on call.  Locate the [facility] provider you are looking for under [facility] and page to a number that you can be directly reached. If you still have difficulty reaching the provider, please page the [facility] (Director on Call) for the Hospitalists listed on amion for assistance.  04/09/2023, 12:40 PM

## 2023-04-10 DIAGNOSIS — K6289 Other specified diseases of anus and rectum: Secondary | ICD-10-CM

## 2023-04-10 DIAGNOSIS — A419 Sepsis, unspecified organism: Secondary | ICD-10-CM | POA: Diagnosis not present

## 2023-04-10 DIAGNOSIS — D62 Acute posthemorrhagic anemia: Secondary | ICD-10-CM | POA: Diagnosis not present

## 2023-04-10 LAB — FUNGUS CULTURE, BLOOD: Culture: NO GROWTH

## 2023-04-10 MED ORDER — METRONIDAZOLE 500 MG PO TABS: 500.0000 mg | ORAL_TABLET | Freq: Two times a day (BID) | ORAL | 0 refills | Status: AC

## 2023-04-10 MED ORDER — METOPROLOL TARTRATE 25 MG PO TABS
12.5000 mg | ORAL_TABLET | Freq: Two times a day (BID) | ORAL | 1 refills | Status: DC
  Filled 2023-04-10: qty 39, 39d supply, fill #0

## 2023-04-10 MED ORDER — DOXYCYCLINE HYCLATE 100 MG PO TABS: 100.0000 mg | ORAL_TABLET | Freq: Two times a day (BID) | ORAL | 0 refills | Status: AC

## 2023-04-10 MED ORDER — MIDODRINE HCL 5 MG PO TABS
5.0000 mg | ORAL_TABLET | Freq: Three times a day (TID) | ORAL | 1 refills | Status: DC
  Filled 2023-04-10: qty 90, 30d supply, fill #0

## 2023-04-10 MED ORDER — HYDROCORTISONE 100 MG/60ML RE ENEM: 100.0000 mg | ENEMA | Freq: Every day | RECTAL | 0 refills | Status: DC

## 2023-04-10 MED ORDER — VANCOMYCIN HCL 125 MG PO CAPS: 125.0000 mg | ORAL_CAPSULE | Freq: Two times a day (BID) | ORAL | 0 refills | Status: AC

## 2023-04-10 MED ORDER — VANCOMYCIN HCL 125 MG PO CAPS
125.0000 mg | ORAL_CAPSULE | Freq: Two times a day (BID) | ORAL | 0 refills | Status: DC
  Filled 2023-04-10: qty 10, 5d supply, fill #0

## 2023-04-10 MED ORDER — MIDODRINE HCL 5 MG PO TABS: 5.0000 mg | ORAL_TABLET | Freq: Three times a day (TID) | ORAL | 1 refills | Status: DC

## 2023-04-10 MED ORDER — DOXYCYCLINE HYCLATE 100 MG PO TABS
100.0000 mg | ORAL_TABLET | Freq: Two times a day (BID) | ORAL | 0 refills | Status: DC
  Filled 2023-04-10: qty 5, 3d supply, fill #0

## 2023-04-10 MED ORDER — CEFADROXIL 500 MG PO CAPS: 500.0000 mg | ORAL_CAPSULE | Freq: Two times a day (BID) | ORAL | 0 refills | Status: AC

## 2023-04-10 MED ORDER — PROCHLORPERAZINE MALEATE 5 MG PO TABS: 5.0000 mg | ORAL_TABLET | Freq: Four times a day (QID) | ORAL | 0 refills | Status: DC | PRN

## 2023-04-10 MED ORDER — CRESEMBA 186 MG PO CAPS: 186.0000 mg | ORAL_CAPSULE | Freq: Every day | ORAL | Status: DC

## 2023-04-10 MED ORDER — METRONIDAZOLE 500 MG PO TABS
500.0000 mg | ORAL_TABLET | Freq: Two times a day (BID) | ORAL | 0 refills | Status: DC
  Filled 2023-04-10: qty 4, 2d supply, fill #0

## 2023-04-10 MED ORDER — METOPROLOL TARTRATE 25 MG PO TABS: 12.5000 mg | ORAL_TABLET | Freq: Two times a day (BID) | ORAL | 1 refills | Status: DC

## 2023-04-10 MED ORDER — HYDROMORPHONE HCL 2 MG PO TABS: 2.0000 mg | ORAL_TABLET | Freq: Four times a day (QID) | ORAL | 0 refills | Status: AC | PRN

## 2023-04-10 MED ORDER — HYDROMORPHONE HCL 2 MG PO TABS
2.0000 mg | ORAL_TABLET | Freq: Four times a day (QID) | ORAL | 0 refills | Status: DC | PRN
  Filled 2023-04-10: qty 20, 5d supply, fill #0

## 2023-04-10 MED ORDER — APIXABAN 2.5 MG PO TABS: 2.5000 mg | ORAL_TABLET | Freq: Two times a day (BID) | ORAL | Status: DC

## 2023-04-10 MED ORDER — HYDROCORTISONE 100 MG/60ML RE ENEM
100.0000 mg | ENEMA | Freq: Every day | RECTAL | 0 refills | Status: DC
  Filled 2023-04-10: qty 60, 1d supply, fill #0

## 2023-04-10 MED ORDER — PROCHLORPERAZINE MALEATE 5 MG PO TABS
5.0000 mg | ORAL_TABLET | Freq: Four times a day (QID) | ORAL | 0 refills | Status: DC | PRN
  Filled 2023-04-10: qty 20, 5d supply, fill #0

## 2023-04-10 MED ORDER — CALCIUM POLYCARBOPHIL 625 MG PO TABS: 1250.0000 mg | ORAL_TABLET | Freq: Two times a day (BID) | ORAL | 0 refills | Status: AC

## 2023-04-10 MED ORDER — CEFADROXIL 500 MG PO CAPS
500.0000 mg | ORAL_CAPSULE | Freq: Two times a day (BID) | ORAL | 0 refills | Status: DC
  Filled 2023-04-10: qty 5, 3d supply, fill #0

## 2023-04-10 MED ORDER — CALCIUM POLYCARBOPHIL 625 MG PO TABS
1250.0000 mg | ORAL_TABLET | Freq: Two times a day (BID) | ORAL | 0 refills | Status: DC
  Filled 2023-04-10: qty 60, 15d supply, fill #0

## 2023-04-10 NOTE — Progress Notes (Signed)
PICC line removed per order. (See line removal documentation). Pt advised to leave dressing on for 24 hours prior to removal, states understanding.

## 2023-04-10 NOTE — Discharge Instructions (Signed)
IMPORTANT INFORMATION: PAY CLOSE ATTENTION   PHYSICIAN DISCHARGE INSTRUCTIONS  Follow with Primary care provider  Christine Journey, PA-C  and other consultants as instructed by your Hospitalist Physician  SEEK MEDICAL CARE OR RETURN TO EMERGENCY ROOM IF SYMPTOMS COME BACK, WORSEN OR NEW PROBLEM DEVELOPS   Please note: You were cared for by a hospitalist during your hospital stay. Every effort will be made to forward records to your primary care provider.  You can request that your primary care provider send for your hospital records if they have not received them.  Once you are discharged, your primary care physician will handle any further medical issues. Please note that NO REFILLS for any discharge medications will be authorized once you are discharged, as it is imperative that you return to your primary care physician (or establish a relationship with a primary care physician if you do not have one) for your post hospital discharge needs so that they can reassess your need for medications and monitor your lab values.  Please get a complete blood count and chemistry panel checked by your Primary MD at your next visit, and again as instructed by your Primary MD.  Get Medicines reviewed and adjusted: Please take all your medications with you for your next visit with your Primary MD  Laboratory/radiological data: Please request your Primary MD to go over all hospital tests and procedure/radiological results at the follow up, please ask your primary care provider to get all Hospital records sent to his/her office.  In some cases, they will be blood work, cultures and biopsy results pending at the time of your discharge. Please request that your primary care provider follow up on these results.  If you are diabetic, please bring your blood sugar readings with you to your follow up appointment with primary care.    Please call and make your follow up appointments as soon as possible.    Also  Note the following: If you experience worsening of your admission symptoms, develop shortness of breath, life threatening emergency, suicidal or homicidal thoughts you must seek medical attention immediately by calling 911 or calling your MD immediately  if symptoms less severe.  You must read complete instructions/literature along with all the possible adverse reactions/side effects for all the Medicines you take and that have been prescribed to you. Take any new Medicines after you have completely understood and accpet all the possible adverse reactions/side effects.   Do not drive when taking Pain medications or sleeping medications (Benzodiazepines)  Do not take more than prescribed Pain, Sleep and Anxiety Medications. It is not advisable to combine anxiety,sleep and pain medications without talking with your primary care practitioner  Special Instructions: If you have smoked or chewed Tobacco  in the last 2 yrs please stop smoking, stop any regular Alcohol  and or any Recreational drug use.  Wear Seat belts while driving.  Do not drive if taking any narcotic, mind altering or controlled substances or recreational drugs or alcohol.

## 2023-04-10 NOTE — Progress Notes (Signed)
Pt discharged via WC to POV with all personal belongings in her possession. 

## 2023-04-10 NOTE — Discharge Summary (Signed)
Physician Discharge Summary  [name] WUX:[id number] DOB: 1970-09-02 DOA: 04/03/2023  PCP: [name], PA-C GI: [facility] ID: [id number]   Admit date: 04/03/2023 Discharge date: 04/10/2023  Admitted From:  Home  Disposition:  Home   Recommendations for Outpatient Follow-up:  Follow up with ID clinic on 04/11/23 as scheduled Follow up with [facility] in 2 weeks   Discharge Condition: STABLE   CODE STATUS: FULL DIET: resume home diet    Brief Hospitalization Summary: Please see all hospital notes, images, labs for full details of the hospitalization. Admission Provider HPI:  52 y.o. female with medical history significant of recent colectomy with Dr. 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   She reports that she was initially in her usual state of health but after arrival to the ED she began having generalized abdominal pain.  She states that her stomach pain is located in the lower portion of her stomach and is fairly diffuse.  She does not recall any alleviating or aggravating factors given that abdominal pain began right after coming to the ED. she was admitted with septic shock, POA with likely intra-abdominal source.  ID consulted for assistance in patient was started on vancomycin and Rocephin and continued on micafungin due to her ongoing fungemia with Candida glabrata.  Hospital Course by problem list   Septic Shock, possibly related to stoma site infection- POA Fungemia 2/2 C. Glabrata - POA Abdominal pain chronic generalized -Appreciate ID recommendations who  followed thru hospitalization  -Micafungin given in hospital per ID service but ID made arrangements for her to take cresemba at discharge and provided her with the samples of the drug at discharge.   -on oral vancomycin twice daily for secondary prophylaxis while on IV antibiotics given history of C. Difficile, now transitioned to doxy and cefadroxil to complete total of 10 days of abx  -Blood and fungal cultures with no growth noted thus far -She has been weaned off of norepinephrine at this point, but continues to have soft blood pressure readings which are approximately 90/50 at baseline per patient -ID made arrangements for her to be approved for Cresemba (pharm D provided to patient at DC) -pt to follow up with Dr. Daiva Eves on 04/11/23 already scheduled   Hyponatremia-improved -Check BMP outpatient recommended -Improved with use of normal saline   Severe hypomagnesemia -repleted     Worsening anemia -I have asked GI team for consultation given guaiac positive stools -Anemia panel negative -Continue to follow CBC and transfuse for hemoglobin less than 7 -Hold apixaban -flex sig planned for 12/5 per GI service with findings of raw rectal mucosa with spontaneous oozing and marked touch friability.    Proctitis  - Dr. Jena Gauss recommended nightly steroid enema x 4 weeks  - follow up with [facility] in 2 weeks  - Dr. Jena Gauss recommended holding apixaban until    [facility] Cr 2.79 on admission, baseline appears to be around 0.6-0.7. Likely in the setting of septic shock and volume depletion. She has received 3L IVF boluses and I have started her on LR @100cc /hr for 12 hours.   Refractory Crohn's disease Recent history of fulminant C. difficile  colitis  Status post colectomy (02/2023, Dr. Luisa Hart) Patient has ran out of her colostomy supplies and will need additional supplies at discharge.  It does not appear that she is currently on any medications for her Crohn's disease. -Colostomy  supplies arranged by [facility] for patient and she verbalized understanding to the instructions -Will need to follow-up with [facility] for further recommendations in regards to Crohns disease -Oral vancomycin per ID -Has high amounts of liquid stool output from ostomy site -appreciate ostomy nurse consultation  - flex sig completed 12/6: steroid enema nightly for 4 weeks recommended.    Rectal bleeding - GI service consulted due to reports of rectal clots from patient and hemoccult positive stool - apixaban currently being held  until 12/15 per GI recommendation, also dose reduced to 2.5 mg per pharm D recommendation due to being discharged home on cresemba.     Type 2 diabetes - diet controlled  Does not appear to be on any medications for this at home.  Last A1c 5.8% 2 months ago.   Hypotension -States blood pressures are typically 90/50 at home, should not require antihypertensives -Holding home antihypertensives in the setting of septic shock -Weaned OFF norepinephrine -midodrine for BP support    History of DVT/PE Noted during recent hospitalization in 02/2023.  She is on Eliquis at home and will continue during hospitalization. -Holding apixaban for now thru 12/15 per GI recommendation and reduced dose to 2.5 mg due to pharm D recommendation due to concurrent use of cresemba   Thrombocytosis Noted during prior hospitalization as well.  Evaluated by hematology and felt to be inflammatory.  Studies at that time included BCR-ABL-1, CML/ALL PCR, JAK2 which were all negative.  Will continue to monitor   Hx of Cardiac arrest (01/2023) -telemetry monitored and was stable, no recurrences   Seizure disorder -Continue home Keppra and gabapentin   GERD -Continue home PPI   COPD Not in exacerbation. -Continue LABA/ICS   Anxiety Patient is on Xanax 1 mg 3 times daily as needed and triazolam 0.25 mg daily.  Reviewed PDMP, she is appropriately obtaining Xanax and triazolam.  Will continue these  medications while here at slightly reduced doses. -Reduced Xanax to 0.5 mg 2 times daily as needed -Continue home triazolam 0.25 mg daily   Discharge Diagnoses:  Principal Problem:   Septic shock (HCC) Active Problems:   Hypomagnesemia   Acute post-hemorrhagic anemia   Discharge Instructions:  Allergies as of 04/10/2023       Reactions   Codeine Shortness Of Breath, Swelling, Rash   Throat swelling   Hydrocodone Shortness Of Breath, Swelling, Rash   Ketorolac Nausea And Vomiting, Swelling, Nausea Only   Pt reports n/v and swelling to throat   Morphine And Codeine Shortness Of Breath, Swelling, Rash   Penicillins Shortness Of Breath, Swelling, Other (See Comments)   Throat swells 02/06/21--TOLERATES CEFTRIAXONE    Nickel Rash   Pregabalin Other (See Comments)   Acetaminophen    Per MD patient states she can't take Tylenol because of liver enzymes   Atarax [hydroxyzine] Itching, Swelling   Mildly swollen throat   Ativan [lorazepam] Other (See Comments)   Migraines.   Oxycodone-acetaminophen Nausea Only, Nausea And Vomiting   Strawberry Extract Swelling   Watermelon Concentrate Swelling   Albuterol Palpitations   Benadryl [diphenhydramine Hcl] Palpitations   Humira [adalimumab] Rash   Latex Rash   Remicade [infliximab] Rash   Blisters and Welts   Tramadol Rash   Rash and itching  Medication List     STOP taking these medications    ivabradine 5 MG Tabs tablet Commonly known as: CORLANOR   micafungin 100 MG Solr injection Commonly known as: MYCAMINE       TAKE these medications    acetaminophen 325 MG tablet Commonly known as: TYLENOL Take 2 tablets (650 mg total) by mouth every 6 (six) hours as needed for mild pain or headache (or Fever >/= 101).   ALPRAZolam 1 MG tablet Commonly known as: XANAX Take 1 mg by mouth 3 (three) times daily as needed for anxiety.   amitriptyline 100 MG tablet Commonly known as: ELAVIL Take 100 mg by mouth at  bedtime.   apixaban 2.5 MG Tabs tablet Commonly known as: ELIQUIS Take 1 tablet (2.5 mg total) by mouth 2 (two) times daily. Start taking on: April 17, 2023 What changed:  medication strength how much to take These instructions start on April 17, 2023. If you are unsure what to do until then, ask your doctor or other care provider.   cefadroxil 500 MG capsule Commonly known as: DURICEF Take 1 capsule (500 mg total) by mouth 2 (two) times daily for 5 doses.   cetirizine 10 MG tablet Commonly known as: ZYRTEC Take 10 mg by mouth daily.   clotrimazole-betamethasone cream Commonly known as: LOTRISONE SMARTSIG:Sparingly Topical Twice Daily PRN   Cresemba 186 MG Caps Generic drug: Isavuconazonium Sulfate Take 1 capsule (186 mg total) by mouth daily. Start taking on: April 11, 2023   cyclobenzaprine 10 MG tablet Commonly known as: FLEXERIL Take 10 mg by mouth 3 (three) times daily as needed.   diphenoxylate-atropine 2.5-0.025 MG tablet Commonly known as: LOMOTIL Take 1 tablet by mouth 4 (four) times daily.   doxycycline 100 MG tablet Commonly known as: VIBRA-TABS Take 1 tablet (100 mg total) by mouth every 12 (twelve) hours for 5 doses.   DULoxetine 30 MG capsule Commonly known as: CYMBALTA Take 30 mg by mouth daily.   feeding supplement Liqd Take 237 mLs by mouth 2 (two) times daily between meals.   fentaNYL 25 MCG/HR Commonly known as: DURAGESIC Place 1 patch onto the skin every 3 (three) days.   folic acid 1 MG tablet Commonly known as: FOLVITE Take 1 tablet (1 mg total) by mouth daily.   gabapentin 300 MG capsule Commonly known as: NEURONTIN TAKE (1) CAPSULE BY MOUTH TWICE DAILY.   hydrocortisone 100 MG/60ML enema Commonly known as: CORTENEMA Place 1 enema (100 mg total) rectally at bedtime.   HYDROmorphone 2 MG tablet Commonly known as: Dilaudid Take 1 tablet (2 mg total) by mouth every 6 (six) hours as needed for up to 5 days for severe pain  (pain score 7-10).   hydrOXYzine 50 MG tablet Commonly known as: ATARAX Take 25-50 mg by mouth 3 (three) times daily as needed.   levETIRAcetam 750 MG tablet Commonly known as: KEPPRA Take 750 mg by mouth 2 (two) times daily.   loperamide 2 MG capsule Commonly known as: IMODIUM Take 2 capsules (4 mg total) by mouth 4 (four) times daily.   metoprolol tartrate 25 MG tablet Commonly known as: LOPRESSOR Take 0.5 tablets (12.5 mg total) by mouth 2 (two) times daily. What changed: how much to take   metroNIDAZOLE 500 MG tablet Commonly known as: FLAGYL Take 1 tablet (500 mg total) by mouth every 12 (twelve) hours for 2 days.   midodrine 5 MG tablet Commonly known as: PROAMATINE Take 1 tablet (5 mg total) by mouth 3 (  three) times daily with meals.   nitroGLYCERIN 0.4 MG SL tablet Commonly known as: NITROSTAT Place 1 tablet (0.4 mg total) under the tongue every 5 (five) minutes as needed for chest pain.   ondansetron 4 MG disintegrating tablet Commonly known as: ZOFRAN-ODT Take 4 mg by mouth every 8 (eight) hours as needed for nausea or vomiting.   pantoprazole 40 MG tablet Commonly known as: PROTONIX Take 1 tablet (40 mg total) by mouth 2 (two) times daily.   polycarbophil 625 MG tablet Commonly known as: FIBERCON Take 2 tablets (1,250 mg total) by mouth 2 (two) times daily for 15 days.   prochlorperazine 5 MG tablet Commonly known as: COMPAZINE Take 1 tablet (5 mg total) by mouth every 6 (six) hours as needed for refractory nausea / vomiting.   Restasis 0.05 % ophthalmic emulsion Generic drug: cycloSPORINE Place 1 drop into both eyes 2 (two) times daily.   Spiriva Respimat 2.5 MCG/ACT Aers Generic drug: Tiotropium Bromide Monohydrate Inhale 1 puff into the lungs daily.   thiamine 100 MG tablet Commonly known as: VITAMIN B1 Take 1 tablet (100 mg total) by mouth daily.   triazolam 0.25 MG tablet Commonly known as: HALCION Take 0.25 mg by mouth daily.    vancomycin 125 MG capsule Commonly known as: VANCOCIN Take 1 capsule (125 mg total) by mouth 2 (two) times daily for 5 days. What changed: See the new instructions.   Vitamin D (Ergocalciferol) 1.25 MG (50000 UNIT) Caps capsule Commonly known as: DRISDOL Take 50,000 Units by mouth once a week. Takes on Wednesdays.        Follow-up Information     Daiva Eves, Lisette Grinder, MD. Go on 04/11/2023.   Specialty: Infectious Diseases Why: Hospital Follow Up as scheduled at 10:45 am Contact information: [address] [place] [address] [phone]         [name], PA-C Follow up.   Specialty: Physician Assistant Contact information: [address] [place] [address] [phone]         Corbin Ade, MD. Schedule an appointment as soon as possible for a visit in 2 week(s).   Specialty: Gastroenterology Why: Hospital Follow Up Contact information: [address] [place] [address] [phone]                Allergies  Allergen Reactions   Codeine Shortness Of Breath, Swelling and Rash    Throat swelling   Hydrocodone Shortness Of Breath, Swelling and Rash   Ketorolac Nausea And Vomiting, Swelling and Nausea Only    Pt reports n/v and swelling to throat   Morphine And Codeine Shortness Of Breath, Swelling and Rash   Penicillins Shortness Of Breath, Swelling and Other (See Comments)    Throat swells 02/06/21--TOLERATES CEFTRIAXONE     Nickel Rash   Pregabalin Other (See Comments)   Acetaminophen     Per MD patient states she can't take Tylenol because of liver enzymes   Atarax [Hydroxyzine] Itching and Swelling    Mildly swollen throat   Ativan [Lorazepam] Other (See Comments)    Migraines.   Oxycodone-Acetaminophen Nausea Only and Nausea And Vomiting   Strawberry Extract Swelling   Watermelon Concentrate Swelling   Albuterol Palpitations   Benadryl [Diphenhydramine Hcl] Palpitations   Humira [Adalimumab] Rash    Latex Rash   Remicade [Infliximab] Rash    Blisters and Welts    Tramadol Rash    Rash and itching   Allergies as of 04/10/2023  Reactions   Codeine Shortness Of Breath, Swelling, Rash   Throat swelling   Hydrocodone Shortness Of Breath, Swelling, Rash   Ketorolac Nausea And Vomiting, Swelling, Nausea Only   Pt reports n/v and swelling to throat   Morphine And Codeine Shortness Of Breath, Swelling, Rash   Penicillins Shortness Of Breath, Swelling, Other (See Comments)   Throat swells 02/06/21--TOLERATES CEFTRIAXONE    Nickel Rash   Pregabalin Other (See Comments)   Acetaminophen    Per MD patient states she can't take Tylenol because of liver enzymes   Atarax [hydroxyzine] Itching, Swelling   Mildly swollen throat   Ativan [lorazepam] Other (See Comments)   Migraines.   Oxycodone-acetaminophen Nausea Only, Nausea And Vomiting   Strawberry Extract Swelling   Watermelon Concentrate Swelling   Albuterol Palpitations   Benadryl [diphenhydramine Hcl] Palpitations   Humira [adalimumab] Rash   Latex Rash   Remicade [infliximab] Rash   Blisters and Welts   Tramadol Rash   Rash and itching        Medication List     STOP taking these medications    ivabradine 5 MG Tabs tablet Commonly known as: CORLANOR   micafungin 100 MG Solr injection Commonly known as: MYCAMINE       TAKE these medications    acetaminophen 325 MG tablet Commonly known as: TYLENOL Take 2 tablets (650 mg total) by mouth every 6 (six) hours as needed for mild pain or headache (or Fever >/= 101).   ALPRAZolam 1 MG tablet Commonly known as: XANAX Take 1 mg by mouth 3 (three) times daily as needed for anxiety.   amitriptyline 100 MG tablet Commonly known as: ELAVIL Take 100 mg by mouth at bedtime.   apixaban 2.5 MG Tabs tablet Commonly known as: ELIQUIS Take 1 tablet (2.5 mg total) by mouth 2 (two) times daily. Start taking on: April 17, 2023 What changed:  medication  strength how much to take These instructions start on April 17, 2023. If you are unsure what to do until then, ask your doctor or other care provider.   cefadroxil 500 MG capsule Commonly known as: DURICEF Take 1 capsule (500 mg total) by mouth 2 (two) times daily for 5 doses.   cetirizine 10 MG tablet Commonly known as: ZYRTEC Take 10 mg by mouth daily.   clotrimazole-betamethasone cream Commonly known as: LOTRISONE SMARTSIG:Sparingly Topical Twice Daily PRN   Cresemba 186 MG Caps Generic drug: Isavuconazonium Sulfate Take 1 capsule (186 mg total) by mouth daily. Start taking on: April 11, 2023   cyclobenzaprine 10 MG tablet Commonly known as: FLEXERIL Take 10 mg by mouth 3 (three) times daily as needed.   diphenoxylate-atropine 2.5-0.025 MG tablet Commonly known as: LOMOTIL Take 1 tablet by mouth 4 (four) times daily.   doxycycline 100 MG tablet Commonly known as: VIBRA-TABS Take 1 tablet (100 mg total) by mouth every 12 (twelve) hours for 5 doses.   DULoxetine 30 MG capsule Commonly known as: CYMBALTA Take 30 mg by mouth daily.   feeding supplement Liqd Take 237 mLs by mouth 2 (two) times daily between meals.   fentaNYL 25 MCG/HR Commonly known as: DURAGESIC Place 1 patch onto the skin every 3 (three) days.   folic acid 1 MG tablet Commonly known as: FOLVITE Take 1 tablet (1 mg total) by mouth daily.   gabapentin 300 MG capsule Commonly known as: NEURONTIN TAKE (1) CAPSULE BY MOUTH TWICE DAILY.   hydrocortisone 100 MG/60ML enema Commonly known as: CORTENEMA Place 1  enema (100 mg total) rectally at bedtime.   HYDROmorphone 2 MG tablet Commonly known as: Dilaudid Take 1 tablet (2 mg total) by mouth every 6 (six) hours as needed for up to 5 days for severe pain (pain score 7-10).   hydrOXYzine 50 MG tablet Commonly known as: ATARAX Take 25-50 mg by mouth 3 (three) times daily as needed.   levETIRAcetam 750 MG tablet Commonly known as: KEPPRA Take  750 mg by mouth 2 (two) times daily.   loperamide 2 MG capsule Commonly known as: IMODIUM Take 2 capsules (4 mg total) by mouth 4 (four) times daily.   metoprolol tartrate 25 MG tablet Commonly known as: LOPRESSOR Take 0.5 tablets (12.5 mg total) by mouth 2 (two) times daily. What changed: how much to take   metroNIDAZOLE 500 MG tablet Commonly known as: FLAGYL Take 1 tablet (500 mg total) by mouth every 12 (twelve) hours for 2 days.   midodrine 5 MG tablet Commonly known as: PROAMATINE Take 1 tablet (5 mg total) by mouth 3 (three) times daily with meals.   nitroGLYCERIN 0.4 MG SL tablet Commonly known as: NITROSTAT Place 1 tablet (0.4 mg total) under the tongue every 5 (five) minutes as needed for chest pain.   ondansetron 4 MG disintegrating tablet Commonly known as: ZOFRAN-ODT Take 4 mg by mouth every 8 (eight) hours as needed for nausea or vomiting.   pantoprazole 40 MG tablet Commonly known as: PROTONIX Take 1 tablet (40 mg total) by mouth 2 (two) times daily.   polycarbophil 625 MG tablet Commonly known as: FIBERCON Take 2 tablets (1,250 mg total) by mouth 2 (two) times daily for 15 days.   prochlorperazine 5 MG tablet Commonly known as: COMPAZINE Take 1 tablet (5 mg total) by mouth every 6 (six) hours as needed for refractory nausea / vomiting.   Restasis 0.05 % ophthalmic emulsion Generic drug: cycloSPORINE Place 1 drop into both eyes 2 (two) times daily.   Spiriva Respimat 2.5 MCG/ACT Aers Generic drug: Tiotropium Bromide Monohydrate Inhale 1 puff into the lungs daily.   thiamine 100 MG tablet Commonly known as: VITAMIN B1 Take 1 tablet (100 mg total) by mouth daily.   triazolam 0.25 MG tablet Commonly known as: HALCION Take 0.25 mg by mouth daily.   vancomycin 125 MG capsule Commonly known as: VANCOCIN Take 1 capsule (125 mg total) by mouth 2 (two) times daily for 5 days. What changed: See the new instructions.   Vitamin D (Ergocalciferol) 1.25 MG  (50000 UNIT) Caps capsule Commonly known as: DRISDOL Take 50,000 Units by mouth once a week. Takes on Wednesdays.        Procedures/Studies: CT ABDOMEN PELVIS WO CONTRAST  Result Date: 04/03/2023 CLINICAL DATA:  Abdominal pain.  Hypotension. EXAM: CT ABDOMEN AND PELVIS WITHOUT CONTRAST TECHNIQUE: Multidetector CT imaging of the abdomen and pelvis was performed following the standard protocol without IV contrast. RADIATION DOSE REDUCTION: This exam was performed according to the departmental dose-optimization program which includes automated exposure control, adjustment of the mA and/or kV according to patient size and/or use of iterative reconstruction technique. COMPARISON:  CT 02/25/2023. FINDINGS: Lower chest: Improved left pleural effusion compared to previous. Improving left lung opacity. There is some mild opacity residual in the lingula dependently. There is also some opacity along the anterior aspect of the right lower lobe. A focal infiltrates possible. Recommend follow-up. Breathing motion. Heart is slightly enlarged. Trace pericardial fluid. Hepatobiliary: Heterogeneous liver with some fatty infiltration. Previous cholecystectomy. Pancreas: Unremarkable. No pancreatic  ductal dilatation or surrounding inflammatory changes. Spleen: Normal in size without focal abnormality. Adrenals/Urinary Tract: Stable slight thickening of the left adrenal gland. Right adrenal gland is preserved. No abnormal calcifications are seen within either kidney nor along the course of either ureter. Preserved contours of the distended urinary bladder there is persistent wall thickening of the bladder particularly superiorly as seen on coronal series 5, image 49 please correlate for any known history or further workup when appropriate Stomach/Bowel: Right lower quadrant ileostomy identified. On this non oral contrast exam the stomach and small bowel are nondilated. Large bowel is relatively decompressed as well. The  previous drain is no longer identified. Areas of stranding along the mesentery on left side are decreasing with significant residual. No free air or free fluid. Vascular/Lymphatic: Normal caliber aorta and IVC. Minimal vascular calcifications no discrete abnormal lymph node enlargement identified in the abdomen and pelvis. Previous small node aortocaval measuring 8 mm in short axis on the prior, today measures 6 mm on series 2 image 34. Reproductive: Status post hysterectomy. No adnexal masses. Other: Please see above Musculoskeletal: Curvature of the spine. Scattered degenerative changes. IMPRESSION: Decreasing mesenteric stranding, fluid with some residual. Previous drain is also no longer present. Right lower quadrant ostomy. No bowel obstruction.  The bowel overall is relatively decompressed. No obstructing renal stone. Improving pleural effusions and lung opacities with some residual opacity in the right lower lobe and lingula. Please correlate for signs of infiltrative recommend continued follow-up. Electronically Signed   By: Karen Kays M.D.   On: 04/03/2023 13:17   DG Chest Port 1 View  Result Date: 04/03/2023 CLINICAL DATA:  Possible sepsis. EXAM: PORTABLE CHEST 1 VIEW COMPARISON:  February 28, 2023. FINDINGS: The heart size and mediastinal contours are within normal limits. Left internal jugular catheter is noted with distal tip in expected position of the SVC. Both lungs are clear. The visualized skeletal structures are unremarkable. IMPRESSION: No active disease. Electronically Signed   By: Lupita Raider M.D.   On: 04/03/2023 12:42     Subjective: No complaints, verbalized understanding to instructions, feels ready to manage at home, feels like she has everything she needs with her ostomy supplies, etc.  She says she will follow up with [facility] in 2 weeks and follow up with ID clinic tomorrow.   Discharge Exam: Vitals:   04/10/23 1059 04/10/23 1509  BP: (!) 98/59 110/64  Pulse: (!)  108 95  Resp: 18   Temp:  97.6 F (36.4 C)  SpO2: 97% 98%   Vitals:   04/09/23 2036 04/10/23 0407 04/10/23 1059 04/10/23 1509  BP: (!) 101/56 (!) 89/43 (!) 98/59 110/64  Pulse: (!) 102 93 (!) 108 95  Resp: 14 14 18    Temp: 98.1 F (36.7 C) 98.2 F (36.8 C)  97.6 F (36.4 C)  TempSrc: Oral Oral    SpO2: 95% 98% 97% 98%  Weight:      Height:       General exam: Appears chronically ill but no acute distress.  Respiratory system: Clear to auscultation. Respiratory effort normal. Cardiovascular system: normal S1 & S2 heard. No JVD, murmurs, rubs, gallops or clicks. No pedal edema. Gastrointestinal system: Abdomen is mildly distended, ostomy appears normal with watery brown stool seen.  Abd very tender to light palpation diffusely. No organomegaly or masses felt. hypoactive bowel sounds heard. Central nervous system: Alert and oriented. No focal neurological deficits. Extremities: Symmetric 5 x 5 power. Skin: No rashes, lesions or ulcers.  Psychiatry: Judgement and insight appear normal.  Mood & affect appropriate.    The results of significant diagnostics from this hospitalization (including imaging, microbiology, ancillary and laboratory) are listed below for reference.     Microbiology: Recent Results (from the past 240 hour(s))  Blood Culture (routine x 2)     Status: None   Collection Time: 04/03/23 11:30 AM   Specimen: BLOOD RIGHT ARM  Result Value Ref Range Status   Specimen Description   Final    BLOOD RIGHT ARM BOTTLES DRAWN AEROBIC AND ANAEROBIC   Special Requests Blood Culture adequate volume  Final   Culture   Final    NO GROWTH 5 DAYS Performed at [facility], [address] [place], [place] [address]    Report Status 04/08/2023 FINAL  Final  Blood Culture (routine x 2)     Status: None   Collection Time: 04/03/23 11:35 AM   Specimen: BLOOD LEFT ARM  Result Value Ref Range Status   Specimen Description BLOOD LEFT ARM BOTTLES DRAWN AEROBIC AND ANAEROBIC  Final    Special Requests Blood Culture adequate volume  Final   Culture   Final    NO GROWTH 5 DAYS Performed at [facility], [address], [place], [place] [address]    Report Status 04/08/2023 FINAL  Final  MRSA Next Gen by PCR, Nasal     Status: None   Collection Time: 04/03/23  1:57 PM   Specimen: Nasal Mucosa; Nasal Swab  Result Value Ref Range Status   MRSA by PCR Next Gen NOT DETECTED NOT DETECTED Final    Comment: (NOTE) The GeneXpert MRSA Assay (FDA approved for NASAL specimens only), is one component of a comprehensive MRSA colonization surveillance program. It is not intended to diagnose MRSA infection nor to guide or monitor treatment for MRSA infections. Test performance is not FDA approved in patients less than 15 years old. Performed at [facility], [address], [place], [place] [address]   Fungus culture, blood     Status: None   Collection Time: 04/03/23  5:49 PM   Specimen: BLOOD RIGHT HAND  Result Value Ref Range Status   Specimen Description BLOOD RIGHT HAND  Final   Special Requests   Final    BOTTLES DRAWN AEROBIC ONLY Blood Culture results may not be optimal due to an inadequate volume of blood received in culture bottles   Culture   Final    NO GROWTH 7 DAYS Performed at [facility], [address], [place], [place] [address]    Report Status 04/10/2023 FINAL  Final  Gastrointestinal Panel by PCR , Stool     Status: None   Collection Time: 04/05/23  1:00 PM   Specimen: Stool  Result Value Ref Range Status   Campylobacter species NOT DETECTED NOT DETECTED Final   Plesimonas shigelloides NOT DETECTED NOT DETECTED Final   Salmonella species NOT DETECTED NOT DETECTED Final   Yersinia enterocolitica NOT DETECTED NOT DETECTED Final   Vibrio species NOT DETECTED NOT DETECTED Final   Vibrio cholerae NOT DETECTED NOT DETECTED Final   Enteroaggregative E coli (EAEC) NOT DETECTED NOT DETECTED Final   Enteropathogenic E coli (EPEC) NOT DETECTED NOT DETECTED Final    Enterotoxigenic E coli (ETEC) NOT DETECTED NOT DETECTED Final   Shiga like toxin producing E coli (STEC) NOT DETECTED NOT DETECTED Final   Shigella/Enteroinvasive E coli (EIEC) NOT DETECTED NOT DETECTED Final   Cryptosporidium NOT DETECTED NOT DETECTED Final   Cyclospora cayetanensis NOT  DETECTED NOT DETECTED Final   Entamoeba histolytica NOT DETECTED NOT DETECTED Final   Giardia lamblia NOT DETECTED NOT DETECTED Final   Adenovirus F40/41 NOT DETECTED NOT DETECTED Final   Astrovirus NOT DETECTED NOT DETECTED Final   Norovirus GI/GII NOT DETECTED NOT DETECTED Final   Rotavirus A NOT DETECTED NOT DETECTED Final   Sapovirus (I, II, IV, and V) NOT DETECTED NOT DETECTED Final    Comment: Performed at [facility], [address] [place], [place] [address]  C Difficile Quick Screen w PCR reflex     Status: None   Collection Time: 04/05/23  1:00 PM   Specimen: Stool  Result Value Ref Range Status   C Diff antigen NEGATIVE NEGATIVE Final   C Diff toxin NEGATIVE NEGATIVE Final   C Diff interpretation No C. difficile detected.  Final    Comment: Performed at [facility], [address] [place], [place] [address]     Labs: BNP (last 3 results) Recent Labs    01/27/23 2352  BNP 558.2*   Basic Metabolic Panel: Recent Labs  Lab 04/04/23 0312 04/05/23 0421 04/06/23 0448 04/07/23 0758 04/08/23 0442  NA 129* 133* 132* 132* 133*  K 3.3* 3.7 3.3* 3.5 3.2*  CL 94* 100 100 101 99  CO2 22 22 24 25 27   GLUCOSE 115* 87 91 92 83  BUN 23* 9 <5* <5* <5*  CREATININE 1.38* 0.65 0.56 0.58 0.56  CALCIUM 7.6* 7.5* 7.5* 8.2* 8.5*  MG  --  0.6* 1.3* 1.7 1.9   Liver Function Tests: Recent Labs  Lab 04/04/23 0312  AST 25  ALT 18  ALKPHOS 118  BILITOT 0.5  PROT 5.2*  ALBUMIN 2.3*   No results for input(s): "LIPASE", "AMYLASE" in the last 168 hours. No results for input(s): "AMMONIA" in the last 168 hours. CBC: Recent Labs  Lab 04/05/23 0421 04/06/23 0448 04/07/23 0758  04/08/23 0442 04/09/23 0326  WBC 7.9 7.4 7.7 7.6 7.7  NEUTROABS  --   --   --  3.6 3.4  HGB 8.3* 8.0* 8.8* 8.4* 8.1*  HCT 26.2* 25.9* 27.2* 26.8* 25.8*  MCV 86.8 86.6 86.6 86.7 88.4  PLT 553* 535* 522* 491* 536*   Cardiac Enzymes: No results for input(s): "CKTOTAL", "CKMB", "CKMBINDEX", "TROPONINI" in the last 168 hours. BNP: Invalid input(s): "POCBNP" CBG: No results for input(s): "GLUCAP" in the last 168 hours. D-Dimer No results for input(s): "DDIMER" in the last 72 hours. Hgb A1c No results for input(s): "HGBA1C" in the last 72 hours. Lipid Profile No results for input(s): "CHOL", "HDL", "LDLCALC", "TRIG", "CHOLHDL", "LDLDIRECT" in the last 72 hours. Thyroid function studies No results for input(s): "TSH", "T4TOTAL", "T3FREE", "THYROIDAB" in the last 72 hours.  Invalid input(s): "FREET3" Anemia work up No results for input(s): "VITAMINB12", "FOLATE", "FERRITIN", "TIBC", "IRON", "RETICCTPCT" in the last 72 hours. Urinalysis    Component Value Date/Time   COLORURINE YELLOW 04/03/2023 1342   APPEARANCEUR HAZY (A) 04/03/2023 1342   LABSPEC 1.012 04/03/2023 1342   PHURINE 5.0 04/03/2023 1342   GLUCOSEU NEGATIVE 04/03/2023 1342   GLUCOSEU NEG mg/dL 82/95/6213 0865   HGBUR NEGATIVE 04/03/2023 1342   BILIRUBINUR NEGATIVE 04/03/2023 1342   KETONESUR NEGATIVE 04/03/2023 1342   PROTEINUR NEGATIVE 04/03/2023 1342   UROBILINOGEN 0.2 12/26/2013 1530   NITRITE NEGATIVE 04/03/2023 1342   LEUKOCYTESUR TRACE (A) 04/03/2023 1342   Sepsis Labs Recent Labs  Lab 04/06/23 0448 04/07/23 0758 04/08/23 0442 04/09/23 0326  WBC 7.4 7.7 7.6 7.7  Microbiology Recent Results (from the past 240 hour(s))  Blood Culture (routine x 2)     Status: None   Collection Time: 04/03/23 11:30 AM   Specimen: BLOOD RIGHT ARM  Result Value Ref Range Status   Specimen Description   Final    BLOOD RIGHT ARM BOTTLES DRAWN AEROBIC AND ANAEROBIC   Special Requests Blood Culture adequate volume  Final    Culture   Final    NO GROWTH 5 DAYS Performed at [facility], [address], [place], [place] [address]    Report Status 04/08/2023 FINAL  Final  Blood Culture (routine x 2)     Status: None   Collection Time: 04/03/23 11:35 AM   Specimen: BLOOD LEFT ARM  Result Value Ref Range Status   Specimen Description BLOOD LEFT ARM BOTTLES DRAWN AEROBIC AND ANAEROBIC  Final   Special Requests Blood Culture adequate volume  Final   Culture   Final    NO GROWTH 5 DAYS Performed at [facility], [address], [place], [place] [address]    Report Status 04/08/2023 FINAL  Final  MRSA Next Gen by PCR, Nasal     Status: None   Collection Time: 04/03/23  1:57 PM   Specimen: Nasal Mucosa; Nasal Swab  Result Value Ref Range Status   MRSA by PCR Next Gen NOT DETECTED NOT DETECTED Final    Comment: (NOTE) The GeneXpert MRSA Assay (FDA approved for NASAL specimens only), is one component of a comprehensive MRSA colonization surveillance program. It is not intended to diagnose MRSA infection nor to guide or monitor treatment for MRSA infections. Test performance is not FDA approved in patients less than 51 years old. Performed at [facility], [address], [place], [place] [address]   Fungus culture, blood     Status: None   Collection Time: 04/03/23  5:49 PM   Specimen: BLOOD RIGHT HAND  Result Value Ref Range Status   Specimen Description BLOOD RIGHT HAND  Final   Special Requests   Final    BOTTLES DRAWN AEROBIC ONLY Blood Culture results may not be optimal due to an inadequate volume of blood received in culture bottles   Culture   Final    NO GROWTH 7 DAYS Performed at [facility], [address] [place], [place] [address]    Report Status 04/10/2023 FINAL  Final  Gastrointestinal Panel by PCR , Stool     Status: None   Collection Time: 04/05/23  1:00 PM   Specimen: Stool  Result Value Ref Range Status   Campylobacter species NOT DETECTED NOT DETECTED Final   Plesimonas  shigelloides NOT DETECTED NOT DETECTED Final   Salmonella species NOT DETECTED NOT DETECTED Final   Yersinia enterocolitica NOT DETECTED NOT DETECTED Final   Vibrio species NOT DETECTED NOT DETECTED Final   Vibrio cholerae NOT DETECTED NOT DETECTED Final   Enteroaggregative E coli (EAEC) NOT DETECTED NOT DETECTED Final   Enteropathogenic E coli (EPEC) NOT DETECTED NOT DETECTED Final   Enterotoxigenic E coli (ETEC) NOT DETECTED NOT DETECTED Final   Shiga like toxin producing E coli (STEC) NOT DETECTED NOT DETECTED Final   Shigella/Enteroinvasive E coli (EIEC) NOT DETECTED NOT DETECTED Final   Cryptosporidium NOT DETECTED NOT DETECTED Final   Cyclospora cayetanensis NOT DETECTED NOT DETECTED Final   Entamoeba histolytica NOT DETECTED NOT DETECTED Final   Giardia lamblia NOT DETECTED NOT DETECTED Final   Adenovirus F40/41 NOT DETECTED NOT DETECTED Final   Astrovirus NOT DETECTED NOT  DETECTED Final   Norovirus GI/GII NOT DETECTED NOT DETECTED Final   Rotavirus A NOT DETECTED NOT DETECTED Final   Sapovirus (I, II, IV, and V) NOT DETECTED NOT DETECTED Final    Comment: Performed at [facility], [address] [place], [place] [address]  C Difficile Quick Screen w PCR reflex     Status: None   Collection Time: 04/05/23  1:00 PM   Specimen: Stool  Result Value Ref Range Status   C Diff antigen NEGATIVE NEGATIVE Final   C Diff toxin NEGATIVE NEGATIVE Final   C Diff interpretation No C. difficile detected.  Final    Comment: Performed at [facility], [address] [place], [place] [address]    Time coordinating discharge: 60 mins  SIGNED:  Standley Dakins, MD  [facility] 04/10/2023, 3:16 PM How to contact the [facility] Attending or Consulting provider 7A - 7P or covering provider during after hours 7P -7A, for this patient?  Check the care team in [facility] and look for a) attending/consulting [facility] provider listed and b) the [facility] team listed Log into [email or web address] and use [facility]'s universal password to access. If you do not have the password, please contact the hospital operator. Locate the [facility] provider you are looking for under [facility] and page to a number that you can be directly reached. If you still have difficulty reaching the provider, please page the [facility] (Director on Call) for the Hospitalists listed on amion for assistance.

## 2023-04-11 ENCOUNTER — Telehealth: Payer: Self-pay

## 2023-04-11 ENCOUNTER — Other Ambulatory Visit (HOSPITAL_COMMUNITY): Payer: Self-pay

## 2023-04-11 ENCOUNTER — Ambulatory Visit: Payer: Medicaid Other | Admitting: Infectious Disease

## 2023-04-11 NOTE — Telephone Encounter (Signed)
Called patient to reschedule missed appointment. No answer, left voice message to contact office.

## 2023-04-11 NOTE — Progress Notes (Unsigned)
Subjective:    Patient ID: [name], female    DOB: [birth year], 52 y.o.   MRN: [id number]  HPI  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   GERD (gastroesophageal reflux disease)    EGD 01/2007 by Dr.Rourke small hiatal hernia s/p 56 french maloney    History of head injury    Hypertension    IBS (irritable bowel syndrome)    PSVT (paroxysmal supraventricular tachycardia) (HCC)    PTSD (post-traumatic stress disorder)    Recurrent chest pain    Seizure disorder (HCC)    Type 2 diabetes mellitus (HCC)     Past Surgical History:  Procedure Laterality Date   ABDOMINAL HYSTERECTOMY     with right salpingo oophorectomy 2005   APPENDECTOMY  2006   BALLOON DILATION N/A 02/19/2021   Procedure: BALLOON DILATION;  Surgeon: Lanelle Bal, DO;  [facility];  Service: Endoscopy;  Laterality: N/A;  Sigmoid colon stricture   BIOPSY  02/06/2021   Procedure: BIOPSY;  Surgeon: Lanelle Bal, DO;  [facility];  Service: Endoscopy;;   BIOPSY  02/19/2021   Procedure: BIOPSY;  Surgeon: Lanelle Bal, DO;  [facility];  Service: Endoscopy;;   BIOPSY  07/15/2021   Procedure: BIOPSY;  Surgeon: Malissa Hippo, MD;  [facility];  Service: Endoscopy;;   CESAREAN SECTION     1996   CHOLECYSTECTOMY     COLECTOMY N/A 02/11/2023   Procedure: TOTAL COLECTOMY;  Surgeon: Harriette Bouillon, MD;  Location: [facility];  Service: General;  Laterality: N/A;   COLONOSCOPY  2010   Dr. Jena Gauss; negative except for hemorrhoids   COLOSTOMY  02/11/2023   Procedure: COLOSTOMY;  Surgeon: Harriette Bouillon, MD;  Location: [facility];  Service: General;;   ESOPHAGEAL DILATION N/A 10/25/2014   Procedure: ESOPHAGEAL DILATION;  Surgeon: Malissa Hippo, MD;  [facility];  Service: Endoscopy;  Laterality: N/A;   ESOPHAGOGASTRODUODENOSCOPY N/A 10/25/2014   Procedure: ESOPHAGOGASTRODUODENOSCOPY (EGD);  Surgeon: Malissa Hippo, MD;  [facility];  Service: Endoscopy;  Laterality: N/A;  1250   FLEXIBLE SIGMOIDOSCOPY N/A 02/06/2021   Procedure: FLEXIBLE SIGMOIDOSCOPY;  Surgeon: Lanelle Bal, DO;  [facility];  Service: Endoscopy;  Laterality: N/A;   FLEXIBLE SIGMOIDOSCOPY N/A 02/19/2021   Procedure: FLEXIBLE SIGMOIDOSCOPY;  Surgeon: Lanelle Bal, DO;  [facility];  Service: Endoscopy;  Laterality: N/A;   FLEXIBLE SIGMOIDOSCOPY N/A 02/02/2023   Procedure: FLEXIBLE SIGMOIDOSCOPY;  Surgeon: Beverley Fiedler, MD;  Location: [facility] ENDOSCOPY;  Service: Gastroenterology;  Laterality: N/A;   FLEXIBLE SIGMOIDOSCOPY N/A 02/10/2023   Procedure: FLEXIBLE SIGMOIDOSCOPY;  Surgeon: Sherrilyn Rist, MD;  Location: [facility] ENDOSCOPY;  Service: Gastroenterology;  Laterality: N/A;   IR FLUORO GUIDE CV LINE LEFT  02/21/2023   IR US GUIDE VASC ACCESS LEFT  02/21/2023   LAPAROTOMY N/A 02/11/2023   Procedure: EXPLORATORY LAPAROTOMY;  Surgeon: Harriette Bouillon, MD;  Location: [facility];  Service: General;  Laterality: N/A;   OOPHORECTOMY     left for torsion and ovarian fibroma; uterine myoma resected 1995   SIGMOIDOSCOPY  07/15/2021   Procedure: SIGMOIDOSCOPY;  Surgeon: Malissa Hippo, MD;  [facility];  Service: Endoscopy;;   TRANSESOPHAGEAL ECHOCARDIOGRAM (CATH LAB) N/A 02/18/2023   Procedure: TRANSESOPHAGEAL ECHOCARDIOGRAM;  Surgeon: Sande Rives, MD;  Location: [facility] INVASIVE CV LAB;  Service: Cardiovascular;  Laterality: N/A;    Family History  Problem Relation Age of Onset   Cancer Mother    Heart failure Mother    Hypertension Mother    Heart attack Mother    Heart attack Father 24   Cancer Father 64   Heart failure Father    Diabetes Father    Crohn's disease Cousin       Social History   Socioeconomic History    Marital status: Divorced    Spouse name: Not on file   Number of children: 1   Years of education: Not on file   Highest education level: Not on file  Occupational History   Not on file  Tobacco Use   Smoking status: Every Day    Current packs/day: 0.50    Average packs/day: 0.5 packs/day for 38.5 years (19.3 ttl pk-yrs)    Types: Cigarettes    Start date: 09/26/1984    Passive exposure: Current   Smokeless tobacco: Never  Vaping Use   Vaping status: Never Used  Substance and Sexual Activity   Alcohol use: No    Alcohol/week: 0.0 standard drinks of alcohol   Drug use: Yes    Types: Marijuana   Sexual activity: Yes    Birth control/protection: Surgical  Other Topics Concern   Not on file  Social History Narrative   Not on file   Social Determinants of Health   Financial Resource Strain: Medium Risk (10/18/2022)   Received from [facility]   Overall Financial Resource Strain (CARDIA)    Difficulty of Paying Living Expenses: Somewhat hard  Food Insecurity: No Food Insecurity (04/03/2023)   Hunger Vital Sign    Worried About Running Out of Food in the Last Year: Never true    Ran Out of Food in the Last Year: Never true  Transportation Needs: No Transportation Needs (04/03/2023)   PRAPARE - Administrator, Civil Service (Medical): No    Lack of Transportation (Non-Medical): No  Physical Activity: Not on file  Stress: Stress Concern Present (06/25/2020)   Received from [facility], [facility] - Occupational Stress Questionnaire    Feeling of Stress : To some extent  Social Connections: Unknown (09/01/2021)   Received from [facility], [facility]   Social Network    Social Network: Not on file    Allergies  Allergen Reactions   Codeine Shortness Of Breath, Swelling and Rash    Throat swelling   Hydrocodone Shortness Of Breath, Swelling and Rash   Ketorolac Nausea And Vomiting, Swelling and Nausea Only     Pt reports n/v and swelling to throat   Morphine And Codeine Shortness Of Breath, Swelling and Rash   Penicillins Shortness Of Breath, Swelling and Other (See Comments)    Throat swells 02/06/21--TOLERATES CEFTRIAXONE     Nickel Rash   Pregabalin Other (See Comments)   Acetaminophen     Per MD patient states she can't take Tylenol because of liver enzymes   Atarax [Hydroxyzine] Itching and Swelling    Mildly swollen throat   Ativan [Lorazepam] Other (See Comments)    Migraines.   Oxycodone-Acetaminophen Nausea Only and Nausea And Vomiting   Strawberry Extract Swelling   Watermelon Concentrate Swelling   Albuterol Palpitations   Benadryl [Diphenhydramine Hcl]  Palpitations   Humira [Adalimumab] Rash   Latex Rash   Remicade [Infliximab] Rash    Blisters and Welts    Tramadol Rash    Rash and itching     Current Outpatient Medications:    acetaminophen (TYLENOL) 325 MG tablet, Take 2 tablets (650 mg total) by mouth every 6 (six) hours as needed for mild pain or headache (or Fever >/= 101)., Disp: , Rfl:    ALPRAZolam (XANAX) 1 MG tablet, Take 1 mg by mouth 3 (three) times daily as needed for anxiety., Disp: , Rfl:    amitriptyline (ELAVIL) 100 MG tablet, Take 100 mg by mouth at bedtime., Disp: , Rfl:    [START ON 04/17/2023] apixaban (ELIQUIS) 2.5 MG TABS tablet, Take 1 tablet (2.5 mg total) by mouth 2 (two) times daily., Disp: , Rfl:    cefadroxil (DURICEF) 500 MG capsule, Take 1 capsule (500 mg total) by mouth 2 (two) times daily for 5 doses., Disp: 5 capsule, Rfl: 0   cetirizine (ZYRTEC) 10 MG tablet, Take 10 mg by mouth daily., Disp: , Rfl:    clotrimazole-betamethasone (LOTRISONE) cream, SMARTSIG:Sparingly Topical Twice Daily PRN, Disp: , Rfl:    cyclobenzaprine (FLEXERIL) 10 MG tablet, Take 10 mg by mouth 3 (three) times daily as needed., Disp: , Rfl:    diphenoxylate-atropine (LOMOTIL) 2.5-0.025 MG tablet, Take 1 tablet by mouth 4 (four) times daily., Disp: 30 tablet, Rfl:  0   doxycycline (VIBRA-TABS) 100 MG tablet, Take 1 tablet (100 mg total) by mouth every 12 (twelve) hours for 5 doses., Disp: 5 tablet, Rfl: 0   DULoxetine (CYMBALTA) 30 MG capsule, Take 30 mg by mouth daily., Disp: , Rfl:    feeding supplement (ENSURE ENLIVE / ENSURE PLUS) LIQD, Take 237 mLs by mouth 2 (two) times daily between meals., Disp: , Rfl:    fentaNYL (DURAGESIC) 25 MCG/HR, Place 1 patch onto the skin every 3 (three) days., Disp: , Rfl:    folic acid (FOLVITE) 1 MG tablet, Take 1 tablet (1 mg total) by mouth daily., Disp: , Rfl:    gabapentin (NEURONTIN) 300 MG capsule, TAKE (1) CAPSULE BY MOUTH TWICE DAILY., Disp: , Rfl:    hydrocortisone (CORTENEMA) 100 MG/60ML enema, Place 1 enema (100 mg total) rectally at bedtime., Disp: 26 mL, Rfl: 0   HYDROmorphone (DILAUDID) 2 MG tablet, Take 1 tablet (2 mg total) by mouth every 6 (six) hours as needed for up to 5 days for severe pain (pain score 7-10)., Disp: 20 tablet, Rfl: 0   hydrOXYzine (ATARAX) 50 MG tablet, Take 25-50 mg by mouth 3 (three) times daily as needed., Disp: , Rfl:    Isavuconazonium Sulfate (CRESEMBA) 186 MG CAPS, Take 1 capsule (186 mg total) by mouth daily., Disp: , Rfl:    levETIRAcetam (KEPPRA) 750 MG tablet, Take 750 mg by mouth 2 (two) times daily., Disp: , Rfl:    loperamide (IMODIUM) 2 MG capsule, Take 2 capsules (4 mg total) by mouth 4 (four) times daily., Disp: 30 capsule, Rfl: 0   metoprolol tartrate (LOPRESSOR) 25 MG tablet, Take 0.5 tablets (12.5 mg total) by mouth 2 (two) times daily., Disp: 30 tablet, Rfl: 1   metroNIDAZOLE (FLAGYL) 500 MG tablet, Take 1 tablet (500 mg total) by mouth every 12 (twelve) hours for 2 days., Disp: 4 tablet, Rfl: 0   midodrine (PROAMATINE) 5 MG tablet, Take 1 tablet (5 mg total) by mouth 3 (three) times daily with meals., Disp: 90 tablet, Rfl: 1   nitroGLYCERIN (NITROSTAT)  0.4 MG SL tablet, Place 1 tablet (0.4 mg total) under the tongue every 5 (five) minutes as needed for chest pain.,  Disp: 100 tablet, Rfl: 0   ondansetron (ZOFRAN-ODT) 4 MG disintegrating tablet, Take 4 mg by mouth every 8 (eight) hours as needed for nausea or vomiting., Disp: , Rfl:    pantoprazole (PROTONIX) 40 MG tablet, Take 1 tablet (40 mg total) by mouth 2 (two) times daily., Disp: 60 tablet, Rfl: 2   polycarbophil (FIBERCON) 625 MG tablet, Take 2 tablets (1,250 mg total) by mouth 2 (two) times daily for 15 days., Disp: 60 tablet, Rfl: 0   prochlorperazine (COMPAZINE) 5 MG tablet, Take 1 tablet (5 mg total) by mouth every 6 (six) hours as needed for refractory nausea / vomiting., Disp: 20 tablet, Rfl: 0   RESTASIS 0.05 % ophthalmic emulsion, Place 1 drop into both eyes 2 (two) times daily., Disp: , Rfl:    SPIRIVA RESPIMAT 2.5 MCG/ACT AERS, Inhale 1 puff into the lungs daily., Disp: 4 g, Rfl: 3   thiamine (VITAMIN B1) 100 MG tablet, Take 1 tablet (100 mg total) by mouth daily., Disp: 30 tablet, Rfl: 0   triazolam (HALCION) 0.25 MG tablet, Take 0.25 mg by mouth daily., Disp: , Rfl:    vancomycin (VANCOCIN) 125 MG capsule, Take 1 capsule (125 mg total) by mouth 2 (two) times daily for 5 days., Disp: 10 capsule, Rfl: 0   Vitamin D, Ergocalciferol, (DRISDOL) 1.25 MG (50000 UNIT) CAPS capsule, Take 50,000 Units by mouth once a week. Takes on Wednesdays., Disp: , Rfl:     Review of Systems     Objective:   Physical Exam        Assessment & Plan:

## 2023-04-12 LAB — SURGICAL PATHOLOGY

## 2023-04-13 ENCOUNTER — Encounter (HOSPITAL_COMMUNITY): Payer: Self-pay | Admitting: Internal Medicine

## 2023-04-20 ENCOUNTER — Other Ambulatory Visit: Payer: Self-pay

## 2023-04-21 ENCOUNTER — Encounter (HOSPITAL_COMMUNITY): Payer: Self-pay | Admitting: Emergency Medicine

## 2023-04-21 ENCOUNTER — Inpatient Hospital Stay (HOSPITAL_COMMUNITY)
Admission: EM | Admit: 2023-04-21 | Discharge: 2023-04-25 | DRG: 683 | Disposition: A | Discharge status: Home / Self Care | Payer: Medicaid Other | Attending: Family Medicine | Admitting: Family Medicine

## 2023-04-21 ENCOUNTER — Other Ambulatory Visit: Payer: Self-pay

## 2023-04-21 DIAGNOSIS — F4310 Post-traumatic stress disorder, unspecified: Secondary | ICD-10-CM | POA: Diagnosis present

## 2023-04-21 DIAGNOSIS — F1120 Opioid dependence, uncomplicated: Secondary | ICD-10-CM | POA: Diagnosis present

## 2023-04-21 DIAGNOSIS — Z880 Allergy status to penicillin: Secondary | ICD-10-CM

## 2023-04-21 DIAGNOSIS — Z933 Colostomy status: Secondary | ICD-10-CM

## 2023-04-21 DIAGNOSIS — F17200 Nicotine dependence, unspecified, uncomplicated: Secondary | ICD-10-CM | POA: Diagnosis present

## 2023-04-21 DIAGNOSIS — G40909 Epilepsy, unspecified, not intractable, without status epilepticus: Secondary | ICD-10-CM | POA: Diagnosis present

## 2023-04-21 DIAGNOSIS — Z8619 Personal history of other infectious and parasitic diseases: Secondary | ICD-10-CM

## 2023-04-21 DIAGNOSIS — Z90710 Acquired absence of both cervix and uterus: Secondary | ICD-10-CM

## 2023-04-21 DIAGNOSIS — F17210 Nicotine dependence, cigarettes, uncomplicated: Secondary | ICD-10-CM | POA: Diagnosis present

## 2023-04-21 DIAGNOSIS — K6289 Other specified diseases of anus and rectum: Secondary | ICD-10-CM | POA: Diagnosis present

## 2023-04-21 DIAGNOSIS — D75839 Thrombocytosis, unspecified: Secondary | ICD-10-CM | POA: Diagnosis present

## 2023-04-21 DIAGNOSIS — G8929 Other chronic pain: Secondary | ICD-10-CM | POA: Diagnosis present

## 2023-04-21 DIAGNOSIS — E871 Hypo-osmolality and hyponatremia: Secondary | ICD-10-CM | POA: Diagnosis present

## 2023-04-21 DIAGNOSIS — E869 Volume depletion, unspecified: Secondary | ICD-10-CM | POA: Diagnosis present

## 2023-04-21 DIAGNOSIS — E872 Acidosis, unspecified: Secondary | ICD-10-CM | POA: Diagnosis present

## 2023-04-21 DIAGNOSIS — K5010 Crohn's disease of large intestine without complications: Secondary | ICD-10-CM | POA: Diagnosis present

## 2023-04-21 DIAGNOSIS — F419 Anxiety disorder, unspecified: Secondary | ICD-10-CM | POA: Diagnosis present

## 2023-04-21 DIAGNOSIS — Z91040 Latex allergy status: Secondary | ICD-10-CM

## 2023-04-21 DIAGNOSIS — Z833 Family history of diabetes mellitus: Secondary | ICD-10-CM

## 2023-04-21 DIAGNOSIS — Z433 Encounter for attention to colostomy: Principal | ICD-10-CM

## 2023-04-21 DIAGNOSIS — E876 Hypokalemia: Secondary | ICD-10-CM | POA: Diagnosis present

## 2023-04-21 DIAGNOSIS — B49 Unspecified mycosis: Secondary | ICD-10-CM | POA: Diagnosis present

## 2023-04-21 DIAGNOSIS — Z9049 Acquired absence of other specified parts of digestive tract: Secondary | ICD-10-CM

## 2023-04-21 DIAGNOSIS — N179 Acute kidney failure, unspecified: Principal | ICD-10-CM | POA: Diagnosis present

## 2023-04-21 DIAGNOSIS — Z79899 Other long term (current) drug therapy: Secondary | ICD-10-CM

## 2023-04-21 DIAGNOSIS — Z91018 Allergy to other foods: Secondary | ICD-10-CM

## 2023-04-21 DIAGNOSIS — Z888 Allergy status to other drugs, medicaments and biological substances status: Secondary | ICD-10-CM

## 2023-04-21 DIAGNOSIS — I10 Essential (primary) hypertension: Secondary | ICD-10-CM | POA: Diagnosis present

## 2023-04-21 DIAGNOSIS — Z5986 Financial insecurity: Secondary | ICD-10-CM

## 2023-04-21 DIAGNOSIS — Z885 Allergy status to narcotic agent status: Secondary | ICD-10-CM

## 2023-04-21 DIAGNOSIS — Z7901 Long term (current) use of anticoagulants: Secondary | ICD-10-CM

## 2023-04-21 DIAGNOSIS — R6510 Systemic inflammatory response syndrome (SIRS) of non-infectious origin without acute organ dysfunction: Secondary | ICD-10-CM | POA: Diagnosis present

## 2023-04-21 DIAGNOSIS — E119 Type 2 diabetes mellitus without complications: Secondary | ICD-10-CM | POA: Diagnosis present

## 2023-04-21 DIAGNOSIS — Z9079 Acquired absence of other genital organ(s): Secondary | ICD-10-CM

## 2023-04-21 DIAGNOSIS — I959 Hypotension, unspecified: Secondary | ICD-10-CM | POA: Diagnosis present

## 2023-04-21 DIAGNOSIS — Z86711 Personal history of pulmonary embolism: Secondary | ICD-10-CM

## 2023-04-21 DIAGNOSIS — Z86718 Personal history of other venous thrombosis and embolism: Secondary | ICD-10-CM

## 2023-04-21 DIAGNOSIS — Z8249 Family history of ischemic heart disease and other diseases of the circulatory system: Secondary | ICD-10-CM

## 2023-04-21 DIAGNOSIS — Z87828 Personal history of other (healed) physical injury and trauma: Secondary | ICD-10-CM

## 2023-04-21 DIAGNOSIS — Z8674 Personal history of sudden cardiac arrest: Secondary | ICD-10-CM

## 2023-04-21 DIAGNOSIS — J4489 Other specified chronic obstructive pulmonary disease: Secondary | ICD-10-CM | POA: Diagnosis present

## 2023-04-21 DIAGNOSIS — Z90721 Acquired absence of ovaries, unilateral: Secondary | ICD-10-CM

## 2023-04-21 NOTE — ED Triage Notes (Signed)
Patient states that her colostomy bag fell off an hour ago and does not have any of the correct supplies to replace at home. C/o abdominal pain in the area of the colostomy which is new started three hours ago, describes it as burning like fire.

## 2023-04-22 ENCOUNTER — Telehealth: Payer: Self-pay

## 2023-04-22 ENCOUNTER — Telehealth: Payer: Self-pay | Admitting: Gastroenterology

## 2023-04-22 ENCOUNTER — Emergency Department (HOSPITAL_COMMUNITY): Payer: Medicaid Other

## 2023-04-22 ENCOUNTER — Other Ambulatory Visit: Payer: Self-pay

## 2023-04-22 DIAGNOSIS — E871 Hypo-osmolality and hyponatremia: Secondary | ICD-10-CM | POA: Diagnosis present

## 2023-04-22 DIAGNOSIS — E869 Volume depletion, unspecified: Secondary | ICD-10-CM | POA: Diagnosis present

## 2023-04-22 DIAGNOSIS — N179 Acute kidney failure, unspecified: Secondary | ICD-10-CM | POA: Diagnosis present

## 2023-04-22 DIAGNOSIS — Z7901 Long term (current) use of anticoagulants: Secondary | ICD-10-CM | POA: Diagnosis not present

## 2023-04-22 DIAGNOSIS — D75839 Thrombocytosis, unspecified: Secondary | ICD-10-CM

## 2023-04-22 DIAGNOSIS — Z86718 Personal history of other venous thrombosis and embolism: Secondary | ICD-10-CM | POA: Diagnosis not present

## 2023-04-22 DIAGNOSIS — F419 Anxiety disorder, unspecified: Secondary | ICD-10-CM | POA: Diagnosis present

## 2023-04-22 DIAGNOSIS — Z8249 Family history of ischemic heart disease and other diseases of the circulatory system: Secondary | ICD-10-CM | POA: Diagnosis not present

## 2023-04-22 DIAGNOSIS — K625 Hemorrhage of anus and rectum: Secondary | ICD-10-CM | POA: Diagnosis not present

## 2023-04-22 DIAGNOSIS — F1120 Opioid dependence, uncomplicated: Secondary | ICD-10-CM | POA: Diagnosis present

## 2023-04-22 DIAGNOSIS — K6289 Other specified diseases of anus and rectum: Secondary | ICD-10-CM | POA: Diagnosis not present

## 2023-04-22 DIAGNOSIS — F17210 Nicotine dependence, cigarettes, uncomplicated: Secondary | ICD-10-CM | POA: Diagnosis present

## 2023-04-22 DIAGNOSIS — B49 Unspecified mycosis: Secondary | ICD-10-CM | POA: Diagnosis present

## 2023-04-22 DIAGNOSIS — I10 Essential (primary) hypertension: Secondary | ICD-10-CM | POA: Diagnosis present

## 2023-04-22 DIAGNOSIS — F4310 Post-traumatic stress disorder, unspecified: Secondary | ICD-10-CM | POA: Diagnosis present

## 2023-04-22 DIAGNOSIS — Z933 Colostomy status: Secondary | ICD-10-CM | POA: Diagnosis not present

## 2023-04-22 DIAGNOSIS — E876 Hypokalemia: Secondary | ICD-10-CM | POA: Diagnosis present

## 2023-04-22 DIAGNOSIS — Z8674 Personal history of sudden cardiac arrest: Secondary | ICD-10-CM | POA: Diagnosis not present

## 2023-04-22 DIAGNOSIS — G8929 Other chronic pain: Secondary | ICD-10-CM | POA: Diagnosis present

## 2023-04-22 DIAGNOSIS — J4489 Other specified chronic obstructive pulmonary disease: Secondary | ICD-10-CM | POA: Diagnosis present

## 2023-04-22 DIAGNOSIS — Z9049 Acquired absence of other specified parts of digestive tract: Secondary | ICD-10-CM | POA: Diagnosis not present

## 2023-04-22 DIAGNOSIS — R6510 Systemic inflammatory response syndrome (SIRS) of non-infectious origin without acute organ dysfunction: Secondary | ICD-10-CM | POA: Diagnosis not present

## 2023-04-22 DIAGNOSIS — F17200 Nicotine dependence, unspecified, uncomplicated: Secondary | ICD-10-CM

## 2023-04-22 DIAGNOSIS — G40909 Epilepsy, unspecified, not intractable, without status epilepticus: Secondary | ICD-10-CM | POA: Diagnosis present

## 2023-04-22 DIAGNOSIS — E872 Acidosis, unspecified: Secondary | ICD-10-CM | POA: Diagnosis present

## 2023-04-22 DIAGNOSIS — K5010 Crohn's disease of large intestine without complications: Secondary | ICD-10-CM | POA: Diagnosis present

## 2023-04-22 DIAGNOSIS — E119 Type 2 diabetes mellitus without complications: Secondary | ICD-10-CM | POA: Diagnosis present

## 2023-04-22 LAB — COMPREHENSIVE METABOLIC PANEL
ALT: 78 U/L — ABNORMAL HIGH (ref 0–44)
AST: 122 U/L — ABNORMAL HIGH (ref 15–41)
Albumin: 3 g/dL — ABNORMAL LOW (ref 3.5–5.0)
Alkaline Phosphatase: 180 U/L — ABNORMAL HIGH (ref 38–126)
Anion gap: 17 — ABNORMAL HIGH (ref 5–15)
BUN: 18 mg/dL (ref 6–20)
CO2: 26 mmol/L (ref 22–32)
Calcium: 8.9 mg/dL (ref 8.9–10.3)
Chloride: 86 mmol/L — ABNORMAL LOW (ref 98–111)
Creatinine, Ser: 2.44 mg/dL — ABNORMAL HIGH (ref 0.44–1.00)
GFR, Estimated: 23 mL/min — ABNORMAL LOW (ref >60)
Glucose, Bld: 108 mg/dL — ABNORMAL HIGH (ref 70–99)
Potassium: 3.4 mmol/L — ABNORMAL LOW (ref 3.5–5.1)
Sodium: 129 mmol/L — ABNORMAL LOW (ref 135–145)
Total Bilirubin: 0.8 mg/dL (ref <1.2)
Total Protein: 6.8 g/dL (ref 6.5–8.1)

## 2023-04-22 LAB — CBC
HCT: 37.2 % (ref 36.0–46.0)
Hemoglobin: 11.7 g/dL — ABNORMAL LOW (ref 12.0–15.0)
MCH: 26.4 pg (ref 26.0–34.0)
MCHC: 31.5 g/dL (ref 30.0–36.0)
MCV: 83.8 fL (ref 80.0–100.0)
Platelets: 462 10*3/uL — ABNORMAL HIGH (ref 150–400)
RBC: 4.44 MIL/uL (ref 3.87–5.11)
RDW: 15.2 % (ref 11.5–15.5)
WBC: 10.3 10*3/uL (ref 4.0–10.5)
nRBC: 0 % (ref 0.0–0.2)

## 2023-04-22 LAB — LACTIC ACID, PLASMA
Lactic Acid, Venous: 2.5 mmol/L (ref 0.5–1.9)
Lactic Acid, Venous: 1.7 mmol/L (ref 0.5–1.9)

## 2023-04-22 LAB — MRSA NEXT GEN BY PCR, NASAL: MRSA by PCR Next Gen: NOT DETECTED

## 2023-04-22 LAB — PROCALCITONIN: Procalcitonin: 0.13 ng/mL

## 2023-04-22 LAB — AMMONIA: Ammonia: 11 umol/L (ref 9–35)

## 2023-04-22 MED ORDER — ALPRAZOLAM 0.5 MG PO TABS
1.0000 mg | ORAL_TABLET | Freq: Three times a day (TID) | ORAL | Status: DC | PRN
  Administered 2023-04-22 – 2023-04-23 (×2): 1 mg via ORAL
  Filled 2023-04-22 (×2): qty 2

## 2023-04-22 MED ORDER — DULOXETINE HCL 30 MG PO CPEP
30.0000 mg | ORAL_CAPSULE | Freq: Every day | ORAL | Status: DC
  Administered 2023-04-23 – 2023-04-25 (×3): 30 mg via ORAL
  Filled 2023-04-22 (×3): qty 1

## 2023-04-22 MED ORDER — PROCHLORPERAZINE EDISYLATE 10 MG/2ML IJ SOLN: 10.0000 mg | Freq: Four times a day (QID) | INTRAMUSCULAR | Status: DC | PRN

## 2023-04-22 MED ORDER — CYCLOBENZAPRINE HCL 10 MG PO TABS
10.0000 mg | ORAL_TABLET | Freq: Three times a day (TID) | ORAL | Status: DC | PRN
  Administered 2023-04-22 – 2023-04-23 (×2): 10 mg via ORAL
  Filled 2023-04-22 (×2): qty 1

## 2023-04-22 MED ORDER — ACETAMINOPHEN 325 MG PO TABS
650.0000 mg | ORAL_TABLET | Freq: Four times a day (QID) | ORAL | Status: DC | PRN
  Administered 2023-04-23: 650 mg via ORAL
  Filled 2023-04-22: qty 2

## 2023-04-22 MED ORDER — POTASSIUM CHLORIDE IN NACL 20-0.9 MEQ/L-% IV SOLN
INTRAVENOUS | Status: AC
  Filled 2023-04-22: qty 1000

## 2023-04-22 MED ORDER — LEVETIRACETAM 500 MG PO TABS
750.0000 mg | ORAL_TABLET | Freq: Two times a day (BID) | ORAL | Status: DC
  Administered 2023-04-22 – 2023-04-25 (×6): 750 mg via ORAL
  Filled 2023-04-22 (×7): qty 1

## 2023-04-22 MED ORDER — HYDROMORPHONE HCL 2 MG PO TABS
2.0000 mg | ORAL_TABLET | ORAL | Status: DC | PRN
  Administered 2023-04-22: 2 mg via ORAL
  Filled 2023-04-22: qty 1

## 2023-04-22 MED ORDER — MIDODRINE HCL 5 MG PO TABS
5.0000 mg | ORAL_TABLET | Freq: Three times a day (TID) | ORAL | Status: DC
  Administered 2023-04-22 – 2023-04-25 (×9): 5 mg via ORAL
  Filled 2023-04-22 (×9): qty 1

## 2023-04-22 MED ORDER — MESALAMINE 4 G RE ENEM: 4.0000 g | ENEMA | Freq: Every day | RECTAL | 1 refills | Status: DC

## 2023-04-22 MED ORDER — PANTOPRAZOLE SODIUM 40 MG PO TBEC
40.0000 mg | DELAYED_RELEASE_TABLET | Freq: Two times a day (BID) | ORAL | Status: DC
  Administered 2023-04-22 – 2023-04-25 (×6): 40 mg via ORAL
  Filled 2023-04-22 (×6): qty 1

## 2023-04-22 MED ORDER — ISAVUCONAZONIUM SULFATE 186 MG PO CAPS
186.0000 mg | ORAL_CAPSULE | Freq: Every day | ORAL | Status: DC
  Administered 2023-04-22 – 2023-04-25 (×4): 186 mg via ORAL
  Filled 2023-04-22 (×6): qty 1

## 2023-04-22 MED ORDER — SODIUM CHLORIDE 0.9 % IV BOLUS
1000.0000 mL | Freq: Once | INTRAVENOUS | Status: AC
  Administered 2023-04-22: 1000 mL via INTRAVENOUS

## 2023-04-22 MED ORDER — HYDROCORTISONE 100 MG/60ML RE ENEM
100.0000 mg | ENEMA | Freq: Every day | RECTAL | Status: DC
  Filled 2023-04-22 (×3): qty 1

## 2023-04-22 MED ORDER — APIXABAN 2.5 MG PO TABS
2.5000 mg | ORAL_TABLET | Freq: Two times a day (BID) | ORAL | Status: DC
  Administered 2023-04-22 – 2023-04-25 (×6): 2.5 mg via ORAL
  Filled 2023-04-22 (×6): qty 1

## 2023-04-22 MED ORDER — CHLORHEXIDINE GLUCONATE CLOTH 2 % EX PADS
6.0000 | MEDICATED_PAD | Freq: Every day | CUTANEOUS | Status: DC
  Administered 2023-04-22 – 2023-04-25 (×4): 6 via TOPICAL

## 2023-04-22 MED ORDER — CYCLOSPORINE 0.05 % OP EMUL
1.0000 [drp] | Freq: Two times a day (BID) | OPHTHALMIC | Status: DC
  Administered 2023-04-22 – 2023-04-25 (×6): 1 [drp] via OPHTHALMIC
  Filled 2023-04-22 (×6): qty 30

## 2023-04-22 MED ORDER — ACETAMINOPHEN 650 MG RE SUPP: 650.0000 mg | Freq: Four times a day (QID) | RECTAL | Status: DC | PRN

## 2023-04-22 MED ORDER — GABAPENTIN 300 MG PO CAPS
300.0000 mg | ORAL_CAPSULE | Freq: Two times a day (BID) | ORAL | Status: DC
  Administered 2023-04-22 – 2023-04-23 (×2): 300 mg via ORAL
  Filled 2023-04-22 (×2): qty 1

## 2023-04-22 MED ORDER — MIDODRINE HCL 5 MG PO TABS
5.0000 mg | ORAL_TABLET | Freq: Three times a day (TID) | ORAL | Status: DC
Start: 2023-04-22 — End: 2023-04-22

## 2023-04-22 MED ORDER — MESALAMINE 4 G RE ENEM
4.0000 g | ENEMA | Freq: Every day | RECTAL | Status: DC
  Administered 2023-04-22 – 2023-04-24 (×3): 4 g via RECTAL
  Filled 2023-04-22 (×5): qty 60

## 2023-04-22 MED ORDER — ENSURE ENLIVE PO LIQD
237.0000 mL | Freq: Two times a day (BID) | ORAL | Status: DC
  Administered 2023-04-22 – 2023-04-25 (×6): 237 mL via ORAL

## 2023-04-22 MED ORDER — HYDROCORTISONE 100 MG/60ML RE ENEM: 100.0000 mg | ENEMA | Freq: Every day | RECTAL | Status: DC

## 2023-04-22 MED ORDER — FOLIC ACID 1 MG PO TABS
1.0000 mg | ORAL_TABLET | Freq: Every day | ORAL | Status: DC
  Administered 2023-04-22 – 2023-04-25 (×4): 1 mg via ORAL
  Filled 2023-04-22 (×4): qty 1

## 2023-04-22 MED ORDER — AMITRIPTYLINE HCL 25 MG PO TABS
100.0000 mg | ORAL_TABLET | Freq: Every day | ORAL | Status: DC
  Administered 2023-04-22: 100 mg via ORAL
  Filled 2023-04-22: qty 4

## 2023-04-22 MED ORDER — LORATADINE 10 MG PO TABS
10.0000 mg | ORAL_TABLET | Freq: Every day | ORAL | Status: DC
Start: 2023-04-23 — End: 2023-04-25
  Administered 2023-04-23 – 2023-04-25 (×3): 10 mg via ORAL
  Filled 2023-04-22 (×3): qty 1

## 2023-04-22 NOTE — ED Notes (Signed)
Patient BP lower again. Dr. Pilar Plate notified. Per Dr. Pilar Plate goal for patient is 90/50. 2nd liter of NS ordered.

## 2023-04-22 NOTE — ED Provider Notes (Addendum)
[facility]-EMERGENCY DEPT [facility] Emergency Department Provider Note MRN:  [id number]  Arrival date & time: 04/22/23     Chief Complaint   Colostomy Issues   History of Present Illness   [name] is a 52 y.o. year-old female with a history of colostomy, Crohn's disease presenting to the ED with chief complaint of colostomy issues.  Ran out of colostomy supplies, has some irritation around the colostomy site, wants a new colostomy bag, denies any other complaints.  Review of Systems  A thorough review of systems was obtained and all systems are negative except as noted in the HPI and PMH.   Patient's Health History    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   GERD (gastroesophageal reflux disease)    EGD 01/2007 by Dr.Rourke small hiatal hernia s/p 56 french maloney    History of head injury    Hypertension    IBS (irritable bowel syndrome)    PSVT (paroxysmal supraventricular tachycardia) (HCC)    PTSD (post-traumatic stress disorder)    Recurrent chest pain    Seizure disorder (HCC)    Type 2 diabetes mellitus (HCC)     Past Surgical History:  Procedure Laterality Date   ABDOMINAL HYSTERECTOMY     with right salpingo oophorectomy 2005   APPENDECTOMY  2006   BALLOON DILATION N/A 02/19/2021   Procedure: BALLOON DILATION;  Surgeon: Lanelle Bal, DO;  Location: [facility] ENDO SU[facility];  Service: Endoscopy;  Laterality: N/A;  Sigmoid colon stricture   BIOPSY  02/06/2021   Procedure: BIOPSY;  Surgeon: Lanelle Bal, DO;  Location: [facility] ENDO SU[facility];  Service: Endoscopy;;   BIOPSY  02/19/2021   Procedure: BIOPSY;  Surgeon: Lanelle Bal, DO;  Location: [facility] END[facility];  Service: Endoscopy;;   BIOPSY  07/15/2021   Procedure: BIOPSY;  Surgeon: Malissa Hippo, MD;  Location: [facility]  ENDO SUITE;  Service: Endoscopy;;   BIOPSY  04/08/2023   Procedure: BIOPSY;  Surgeon: Corbin Ade, MD;  Location: [facility] ENDO [facility];  Service: Endoscopy;;   CESAREAN SECTION     1996   CHOLECYSTECTOMY     COLECTOMY N/A 02/11/2023   Procedure: TOTAL COLECTOMY;  Surgeon: Harriette Bouillon, MD;  Location: [facility] OR;  Service: General;  Laterality: N/A;   COLONOSCOPY  2010   Dr. Jena Gauss; negative except for hemorrhoids   COLOSTOMY  02/11/2023   Procedure: COLOSTOMY;  Surgeon: Harriette Bouillon, MD;  Location: [facility] OR;  Service: General;;   ESOPHAGEAL DILATION N/A 10/25/2014   Procedure: ESOPHAGEAL DILATION;  Surgeon: Malissa Hippo, MD;  Location: [facility];  Service: Endoscopy;  Laterality: N/A;   ESOPHAGOGASTRODUODENOSCOPY N/A 10/25/2014   Procedure: ESOPHAGOGASTRODUODENOSCOPY (EGD);  Surgeon: Malissa Hippo, MD;  Location: [facility];  Service: Endoscopy;  Laterality: N/A;  1250   FLEXIBLE SIGMOIDOSCOPY N/A 02/06/2021   Procedure: FLEXIBLE SIGMOIDOSCOPY;  Surgeon: Lanelle Bal, DO;  Location[facility];  Service: Endoscopy;  Laterality: N/A;   FLEXIBLE SIGMOIDOSCOPY N/A 02/19/2021   Procedure: FLEXIBLE SIGMOIDOSCOPY;  Surgeon: Lanelle Bal, DO;  Location[facility];  Service: Endoscopy;  Laterality: N/A;   FLEXIBLE SIGMOIDOSCOPY N/A 02/02/2023   Procedure: FLEXIBLE SIGMOIDOSCOPY;  Surgeon: Beverley Fiedler, MD;  Location[facility] ENDOSCOPY;  Service:  Gastroenterology;  Laterality: N/A;   FLEXIBLE SIGMOIDOSCOPY N/A 02/10/2023   Procedure: FLEXIBLE SIGMOIDOSCOPY;  Surgeon: Sherrilyn Rist, MD;  Location: [facility] ENDOSCOPY;  Service: Gastroenterology;  Laterality: N/A;   FLEXIBLE SIGMOIDOSCOPY N/A 04/08/2023   Procedure: FLEXIBLE SIGMOIDOSCOPY;  Surgeon: Corbin Ade, MD;  Location: [facility];  Service: Endoscopy;  Laterality: N/A;   IR FLUORO GUIDE CV LINE LEFT  02/21/2023   IR US GUIDE VASC ACCESS LEFT  02/21/2023   LAPAROTOMY N/A 02/11/2023   Procedure: EXPLORATORY LAPAROTOMY;   Surgeon: Harriette Bouillon, MD;  Location: [facility] OR;  Service: General;  Laterality: N/A;   OOPHORECTOMY     left for torsion and ovarian fibroma; uterine myoma resected 1995   SIGMOIDOSCOPY  07/15/2021   Procedure: SIGMOIDOSCOPY;  Surgeon: Malissa Hippo, MD;  Location[facility];  Service: Endoscopy;;   TRANSESOPHAGEAL ECHOCARDIOGRAM (CATH LAB) N/A 02/18/2023   Procedure: TRANSESOPHAGEAL ECHOCARDIOGRAM;  Surgeon: Sande Rives, MD;  Location: [facility] INVASIVE CV LAB;  Service: Cardiovascular;  Laterality: N/A;    Family History  Problem Relation Age of Onset   Cancer Mother    Heart failure Mother    Hypertension Mother    Heart attack Mother    Heart attack Father 57   Cancer Father 39   Heart failure Father    Diabetes Father    Crohn's disease Cousin     Social History   Socioeconomic History   Marital status: Divorced    Spouse name: Not on file   Number of children: 1   Years of education: Not on file   Highest education level: Not on file  Occupational History   Not on file  Tobacco Use   Smoking status: Every Day    Current packs/day: 0.50    Average packs/day: 0.5 packs/day for 38.6 years (19.3 ttl pk-yrs)    Types: Cigarettes    Start date: 09/26/1984    Passive exposure: Current   Smokeless tobacco: Never  Vaping Use   Vaping status: Never Used  Substance and Sexual Activity   Alcohol use: No    Alcohol/week: 0.0 standard drinks of alcohol   Drug use: Yes    Types: Marijuana   Sexual activity: Yes    Birth control/protection: Surgical  Other Topics Concern   Not on file  Social History Narrative   Not on file   Social Drivers of Health   Financial Resource Strain: Medium Risk (10/18/2022)   Received from [facility]   Overall Financial Resource Strain (CARDIA)    Difficulty of Paying Living Expenses: Somewhat hard  Food Insecurity: No Food Insecurity (04/03/2023)   Hunger Vital Sign    Worried About Running Out of Food in the Last Year: Never  true    Ran Out of Food in the Last Year: Never true  Transportation Needs: No Transportation Needs (04/03/2023)   PRAPARE - Administrator, Civil Service (Medical): No    Lack of Transportation (Non-Medical): No  Physical Activity: Not on file  Stress: Stress Concern Present (06/25/2020)   Received from [facility], [facility] of Occup[facility] - Occupational Stress Questionnaire    Feeling of Stress : To some extent  Social Connections: Unknown (09/01/2021)   Received from [facility], [facility]   Social Network    Social Network: Not on file  Intimate Partner Violence: Not At Risk (04/03/2023)   Humiliation, Afraid, Rape, and Kick questionnaire    Fear of  Current or Ex-Partner: No    Emotionally Abused: No    Physically Abused: No    Sexually Abused: No     Physical Exam   Vitals:   04/22/23 0515 04/22/23 0530  BP: (!) 104/51 (!) 105/59  Pulse: 96 97  Resp: 19 18  Temp:    SpO2: 98% 97%    CONSTITUTIONAL: Well-appearing, NAD NEURO/PSYCH: Resting comfortably, wakes easily EYES:  eyes equal and reactive ENT/NECK:  no LAD, no JVD CARDIO: Regular rate, well-perfused, normal S1 and S2 PULM:  CTAB no wheezing or rhonchi GI/GU:  non-distended, non-tender MSK/SPINE:  No gross deformities, no edema SKIN:  no rash, atraumatic   *Additional and/or pertinent findings included in MDM below  Diagnostic and Interventional Summary    EKG Interpretation Date/Time:    Ventricular Rate:    PR Interval:    QRS Duration:    QT Interval:    QTC Calculation:   R Axis:      Text Interpretation:         Labs Reviewed  CBC - Abnormal; Notable for the following components:      Result Value   Hemoglobin 11.7 (*)    Platelets 462 (*)    All other components within normal limits  COMPREHENSIVE METABOLIC PANEL - Abnormal; Notable for the following components:   Sodium 129 (*)    Potassium 3.4 (*)    Chloride 86 (*)    Glucose, Bld  108 (*)    Creatinine, Ser 2.44 (*)    Albumin 3.0 (*)    AST 122 (*)    ALT 78 (*)    Alkaline Phosphatase 180 (*)    GFR, Estimated 23 (*)    Anion gap 17 (*)    All other components within normal limits  LACTIC ACID, PLASMA - Abnormal; Notable for the following components:   Lactic Acid, Venous 2.5 (*)    All other components within normal limits  AMMONIA  LACTIC ACID, PLASMA    CT ABDOMEN PELVIS WO CONTRAST  Final Result      Medications  sodium chloride 0.9 % bolus 1,000 mL (0 mLs Intravenous Stopped 04/22/23 0540)     Procedures  /  Critical Care Procedures  ED Course and Medical Decision Making  Initial Impression and Ddx Minimal complaints other than needing colostomy bag and further supplies.  The site around the colostomy looks a bit irritated but doubt significant infection, likely just needs proper seal with new bag.  Past medical/surgical history that increases complexity of ED encounter: Colostomy  Interpretation of Diagnostics Laboratory and/or imaging options to aid in the diagnosis/care of the patient were considered.  After careful history and physical examination, it was determined that there was no indication for diagnostics at this time.  Patient Reassessment and Ultimate Disposition/Management Clinical Course as of 04/22/23 0545  Fri Apr 22, 2023  0158 Prior to discharge patient seemed very sleepy in the room.  A bit difficult to wake but is able to wake up and answer questions.  Sounds like she took sleeping pills this evening prior to coming here.  Bit of a soft blood pressure in the setting of deep sleep, will monitor here in the emergency department to ensure her safety. [MB]  [id number] Patient was admitted to the hospital for concern for septic shock earlier this month.  Her chief complaint at that time was colostomy supplies.  Continues to have soft blood pressure here in the emergency department, according to discharge summary her  baseline blood pressure  is 90/50.  She had a significant acute kidney injury at that time, based on the documentation and pictures available she had much more erythema and inflammation surrounding her colostomy site at that time, seems much improved.  So overall doubt sepsis currently.  Given her recent AKI and her soft blood pressure will provide a liter of fluids and check labs. [MB]    Clinical Course User Index [MB] Sabas Sous, MD     5:45 AM update: Labs reveal acute kidney injury, continued hyponatremia.  Initial lactate elevated but cleared after fluids.  Blood pressure improved after fluids.  Still somnolent.  Ammonia negative.  Elevated LFTs of unclear cause.  CT abdomen not acute.  Will admit to medicine.  Patient management required discussion with the following services or consulting groups:  None  Complexity of Problems Addressed Acute complicated illness or Injury  Additional Data Reviewed and Analyzed Further history obtained from: None  Additional Factors Impacting ED Encounter Risk None  Elmer Sow. Pilar Plate, MD [facility] Emergency Medicine [facility] [email or web address]  Final Clinical Impressions(s) / ED Diagnoses     ICD-10-CM   1. Colostomy care (HCC)  Z43.3     2. AKI (acute kidney injury) (HCC)  N17.9       ED Discharge Orders     None        Discharge Instructions Discussed with and Provided to Patient:    Discharge Instructions      You were evaluated in the Emergency Department and after careful evaluation, we did not find any emergent condition requiring admission or further testing in the hospital.  Your exam/testing today is overall reassuring.  We replaced your colostomy bag in the emergency department, recommend follow-up with your regular doctors for continued care and for obtaining the supplies.  Please return to the Emergency Department if you experience any worsening of your condition.   Thank you for allowing [place] to be a part of your  care.      Sabas Sous, MD 04/22/23 5956    Sabas Sous, MD 04/22/23 [phone]

## 2023-04-22 NOTE — Consult Note (Addendum)
WOC Nurse ostomy consult note; WOC is very familiar with this patient from multiple visits at [facility] and [facility] last seen in person by this WOC RN 12/5; has high output ileostomy placed 02/11/2023 by Dr. Luisa Hart  Stoma type/location: RLQ ileostomy  Stomal assessment/size: 1" round above skin level when last seen 04/07/2023    Peristomal assessment:  weeping denuded skin  Treatment options for stomal/peristomal skin: crusting of skin with no sting barrier wipe and stoma powder (lawson #6)  Sprinkle stoma powder over any irritated skin, brush away excess powder Tap lightly over the powder with Cavilon No-Sting barrier wipe (skin barrier wipe-white and blue package)  Allow to dry Apply another layer of powder following the same steps up to three layers.  After dry proceed with routine pouching    Pouching system that works best for [place] is a convex 2 1/4" skin barrier Hart [place] [phone]), 2 1/4" high output pouch Hart [place] [phone]) 2" barrier ring Hart [place] [phone]) placed snugly around stoma. Skin must be crusted as above first.   Spoke with Lupita Leash in materials, a flat 2 3/4" had already been taken to ED. I asked that  a 2 3/4" high output pouch Hart [place] [phone]) be taken to ED and barrier ring as well as stoma powder.   Patient has Medicaid and can order supplies through [facility] which has been discussed with her on last admission (information given to her by myself and Child psychotherapist).  [facility] Number [phone]. Patient also has information on ostomy clinic and they have tried to contact her without success.    WOC team will not follow.  Re-consult if further needs arise.    Thank you,    Priscella Mann MSN, RN-BC, [employer] [phone]

## 2023-04-22 NOTE — ED Notes (Signed)
BP still lower 82/46, Dr. Pilar Plate aware. Wanting to let 2nd liter of fluid finish infusing.

## 2023-04-22 NOTE — ED Notes (Signed)
Unable to obtain oral or axillary temperature, this RN assessed temp via stoma with temp of 96.4, pt provided warm blankets, MD notified

## 2023-04-22 NOTE — ED Notes (Addendum)
Colostomy bag changed per [facility] RN instructions-skin around that stoma crusted with stoma powder and no sting barrier wipe, 2" barrier ring to fit around stoma, 2 1/4" flat skin barrier and a high output pouch connected to a foley drainage bag. Pt tolerated well.

## 2023-04-22 NOTE — Telephone Encounter (Signed)
Please arrange hospital follow-up for patient in 3-4 weeks with [facility] or Dr. Levon Hedger.

## 2023-04-22 NOTE — ED Notes (Signed)
Pt placed on 5-lead.

## 2023-04-22 NOTE — ED Notes (Signed)
Placed three warm blankets on pt due to low core temp

## 2023-04-22 NOTE — ED Notes (Signed)
Colostomy bag replaced.

## 2023-04-22 NOTE — ED Notes (Signed)
Pt able to ambulate to the bathroom independently. Pt called out stating that she needed to go to the bathroom, when entering the room the pt's ostomy bag was full and leaking on the bed, blankets, pt, and pts clothing. Pt then assisted to the bathroom where she emptied her bag and was able to do a bath independently. Pt bed changed, gown provided to pt to change after cleaning up. RN notified of ostomy leaking.

## 2023-04-22 NOTE — ED Notes (Signed)
Pt in bed, not on the monitor, cleaning stool from her hands, pt declines RN assistance, pt aware to call before walking and that she will need to resume monitoring as soon as she is done with hygiene care

## 2023-04-22 NOTE — Progress Notes (Signed)
Formal consult not completed. Chart reviewed. Patient note seen.    This is a 51 year old female with medical history significant for inflammatory stricturing Crohn's disease as well as a history of C. 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  She also had recent PICC line infection with C glabrata and was treated with antifungal.  She presented to the ED this admission given she was without ostomy supplies at home.  She was admitted for AKI and GI consult placed given persistent passage of rectal clots.   She had recent admission earlier this month for rectal bleeding.  Flexible sigmoidoscopy performed which revealed diffusely Rawel rectal mucosa with spontaneous oozing and marked touch friability.  Her biopsies revealed predominant ulcer with granulation tissue and limited colonic mucosa.  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  She was treated with hydrocortisone enemas during prior hospitalization recommended to continue these nightly for 4 weeks.  After biopsy results were reviewed she was recommended to stop hydrocortisone enemas and began short-chain fatty acid enemas.  Our office reached out to patient on 12/18 after reviewing biopsies and provided patient these recommendations however she stated that she was not given hydrocortisone enemas on discharge previously.  [facility] was consulted and they were unable to compound this medication.  I reached out to customer care in [place] and patient was notified that she would have to pick up medication.  Patient states that she was not able to go to [facility] to pick this up.  Patient appears to be experiencing ongoing symptoms related to her proctitis which has essentially not been treated since discharge.  Her hemoglobin is stable this admission at 11.7.  Given we are not able to obtain short-chain fatty acid  enemas while inpatient and patient is unable to afford or obtain short-chain fatty acid enemas we will treat with mesalamine enemas 4 g nightly for 4 weeks.  She was originally recommended follow-up outpatient which she has not complied with.  Recommendations: Mesalamine enemas 4 g nightly for 4 weeks Outpatient follow-up in our office in 3-4 weeks Trend H/H, transfuse as needed. For now can continue Eliquis as long as no significant anemia present. GI will sign off at this time, please feel free to reach out if you have any further questions or concerns   Brooke Bonito, MSN, APRN, FNP-BC, AGACNP-BC [facility] at [employer]

## 2023-04-22 NOTE — TOC Initial Note (Signed)
Transition of Care [facility]) - Initial/Assessment Note    Patient Details  Name: [name] MRN: [id number] Date of Birth: [birth year]  Transition of [facility]) CM/SW Contact:    Elliot Gault, LCSW Phone Number: 04/22/2023, 11:29 AM  Clinical Narrative:                  Pt from home. She is well known to [facility] from previous admissions. Pt will return home at dc.  Per MD, pt has not been going to follow up appointments and she has not ordered her colostomy supplies. Pt has Medicaid and she has been given information on transportation resources as well as information on how to order her supplies which are covered by her Medicaid. This same information has been added to her AVS and reviewed with her by the Ostomy RN.  TOC will follow and assist as needed. Expected Discharge Plan: Home/Self Care Barriers to Discharge: Continued Medical Work up   Patient Goals and CMS Choice Patient states their goals for this hospitalization and ongoing recovery are:: go home          Expected Discharge Plan and Services In-house Referral: Clinical Social Work     Living arrangements for the past 2 months: Single Family Home                                      Prior Living Arrangements/Services Living arrangements for the past 2 months: Single Family Home Lives with:: Adult Children Patient language and need for interpreter reviewed:: Yes Do you feel safe going back to the place where you live?: Yes      Need for Family Participation in Patient Care: Yes (Comment) Care giver support system in place?: Yes (comment) Current home services: DME    Activities of Daily Living   ADL Screening (condition at time of admission) Independently performs ADLs?: Yes (appropriate for developmental age) Is the patient deaf or have difficulty hearing?: No Does the patient have difficulty seeing, even when wearing glasses/contacts?: No Does the patient have difficulty concentrating,  remembering, or making decisions?: No  Permission Sought/Granted                  Emotional Assessment       Orientation: : Oriented to Self, Oriented to Place, Oriented to  Time, Oriented to Situation Alcohol / Substance Use: Not Applicable Psych Involvement: No (comment)  Admission diagnosis:  Acute kidney injury (HCC) [N17.9] Patient Active Problem List   Diagnosis Date Noted   Acute kidney injury (HCC) 04/22/2023   Hypomagnesemia 04/07/2023   Acute post-hemorrhagic anemia 04/07/2023   Septic shock (HCC) 04/03/2023   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   Cardiac arrest (HCC) 01/27/2023   Acute respiratory failure with hypoxia (HCC) 01/27/2023   Upper GI bleed 01/27/2023   GI bleed 01/15/2022   Rectal bleeding 01/12/2022   Acute deep vein thrombosis (DVT) of brachial vein of right upper extremity (HCC)  Enteritis due to Clostridium difficile 12/24/2021   Proctocolitis 12/23/2021   UTI (urinary tract infection) 12/23/2021   Thrombocytosis 10/02/2021   Gait instability 10/02/2021   Hypokalemia 10/01/2021   Skin test reaction, systemic 09/07/2021   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   N&V (nausea and vomiting) 04/30/2021   Abdominal pain    Bloating    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HCC)    Hematochezia 02/04/2021   Crohn's disease of large intestine with other complication (HCC) 02/03/2021   Diarrhea    Metabolic encephalopathy 01/20/2021   Moderate Pericardial  effusion 01/19/2021   Right foot pain    AKI (acute kidney injury) (HCC) 01/16/2021   Rhabdomyolysis 01/16/2021   Lactic acidosis 01/16/2021   COVID-19 virus infection 01/16/2021   Shock (HCC) 01/16/2021   Seizure (HCC) 06/24/2020   MDD (major depressive disorder) 03/24/2020   Chest pain 02/07/2020   Chronic patellofemoral pain of both knees 01/11/2020   Essential hypertension 06/17/2018   Insomnia 12/13/2017   Neuropathy 12/13/2017   Chest pain at rest 05/09/2016   COPD (chronic obstructive pulmonary disease) (HCC)    Crohn's disease with complication (HCC)    Diabetes mellitus without complication (HCC)    Anxiety    Cigarette smoker    PTSD (post-traumatic stress disorder)    Palpitation    Constipation 09/20/2012   Head injury 05/09/2012   Hyperlipidemia 04/11/2012   DJD (degenerative joint disease), lumbar 04/04/2012   Paroxysmal SVT (supraventricular tachycardia) (HCC) 04/04/2012   Tobacco dependence 04/04/2012   DOE (dyspnea on exertion) 01/26/2012   Paresthesia 11/25/2011   Abdominal pain, chronic, epigastric 11/01/2011   Abdominal pain, acute, periumbilical 11/01/2011   GERD (gastroesophageal reflux disease)    Hyperglycemia    Chronic low back pain    IBS (irritable bowel syndrome)    Closed head injury    TOBACCO ABUSE 07/13/2010   Palpitations 07/13/2010   Pleuritic chest pain 07/13/2010   Anxiety state 07/03/2008   BACK PAIN, LUMBAR, CHRONIC 07/03/2008   PCP:  [name], PA-C Pharmacy:   [facility] [place], [place] - [address] [place] [address] Phone: [phone] Fax: [phone]  [facility] [address], [place] - [address] [place] [address] Phone: [phone] Fax: [phone]  [facility] [address], [place] - [address] [place] [address] [place] [address] [place] [address] Phone: [phone] Fax: [phone]  [name] [address] [place] [address] Phone: [phone] Fax: [phone]  [facility] [address] [place] [address] Phone: [phone] Fax: [phone]     Social Drivers of Health (SDOH) Social History: SDOH Screenings   Food Insecurity: Patient Unable To Answer (04/22/2023)  Housing: Patient Unable To Answer (04/22/2023)  Transportation Needs: Patient Unable To Answer (04/22/2023)  Utilities: Patient Unable To Answer (04/22/2023)  Financial Resource Strain: Medium Risk (10/18/2022)   Received from [facility]  Social Connections: Unknown (09/01/2021)   Received from [facility], [facility]  Stress: Stress Concern Present (06/25/2020)   Received from [facility], [facility]  Tobacco Use: High Risk (04/21/2023)   SDOH Interventions:     Readmission Risk Interventions    04/04/2023    1:36 PM  Readmission Risk Prevention Plan  Transportation Screening Complete  Medication Review (RN Care Manager) Complete  PCP or Specialist appointment within 3-5 days of discharge Not Complete  HRI or  Home Care Consult Not Complete  SW Recovery Care/Counseling Consult Not Complete  Palliative Care Screening Not Applicable  Skilled Nursing Facility Not Applicable

## 2023-04-22 NOTE — Discharge Instructions (Signed)
You were evaluated in the Emergency Department and after careful evaluation, we did not find any emergent condition requiring admission or further testing in the hospital.  Your exam/testing today is overall reassuring.  We replaced your colostomy bag in the emergency department, recommend follow-up with your regular doctors for continued care and for obtaining the supplies.  Please return to the Emergency Department if you experience any worsening of your condition.   Thank you for allowing [place] to be a part of your care.  IMPORTANT INFORMATION: PAY CLOSE ATTENTION   PHYSICIAN DISCHARGE INSTRUCTIONS  Follow with Primary care provider  [name], PA-C  and other consultants as instructed by your Hospitalist Physician  SEEK MEDICAL CARE OR RETURN TO EMERGENCY ROOM IF SYMPTOMS COME BACK, WORSEN OR NEW PROBLEM DEVELOPS   Please note: You were cared for by a hospitalist during your hospital stay. Every effort will be made to forward records to your primary care provider.  You can request that your primary care provider send for your hospital records if they have not received them.  Once you are discharged, your primary care physician will handle any further medical issues. Please note that NO REFILLS for any discharge medications will be authorized once you are discharged, as it is imperative that you return to your primary care physician (or establish a relationship with a primary care physician if you do not have one) for your post hospital discharge needs so that they can reassess your need for medications and monitor your lab values.  Please get a complete blood count and chemistry panel checked by your Primary MD at your next visit, and again as instructed by your Primary MD.  Get Medicines reviewed and adjusted: Please take all your medications with you for your next visit with your Primary MD  Laboratory/radiological data: Please request your Primary MD to go over all hospital tests  and procedure/radiological results at the follow up, please ask your primary care provider to get all Hospital records sent to his/her office.  In some cases, they will be blood work, cultures and biopsy results pending at the time of your discharge. Please request that your primary care provider follow up on these results.  If you are diabetic, please bring your blood sugar readings with you to your follow up appointment with primary care.    Please call and make your follow up appointments as soon as possible.    Also Note the following: If you experience worsening of your admission symptoms, develop shortness of breath, life threatening emergency, suicidal or homicidal thoughts you must seek medical attention immediately by calling 911 or calling your MD immediately  if symptoms less severe.  You must read complete instructions/literature along with all the possible adverse reactions/side effects for all the Medicines you take and that have been prescribed to you. Take any new Medicines after you have completely understood and accpet all the possible adverse reactions/side effects.   Do not drive when taking Pain medications or sleeping medications (Benzodiazepines)  Do not take more than prescribed Pain, Sleep and Anxiety Medications. It is not advisable to combine anxiety,sleep and pain medications without talking with your primary care practitioner  Special Instructions: If you have smoked or chewed Tobacco  in the last 2 yrs please stop smoking, stop any regular Alcohol  and or any Recreational drug use.  Wear Seat belts while driving.  Do not drive if taking any narcotic, mind altering or controlled substances or recreational drugs or alcohol.

## 2023-04-22 NOTE — H&P (Signed)
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  The patient continues to complain of her usual abdominal pain for which she received Dilaudid 2 mg, #20 on 04/11/2023 at the time of her last discharge. The patient was recently hospitalized from 04/03/2023 to 04/10/2023 for septic shock related to her stoma site infection and prior candidemia.  The patient was discharged home with 5 more doses of doxycycline and cefadroxil.  She was also sent home with Cresemba to finish on 04/14/2023.  GI was also consulted secondary to rectal bleeding.  Flex sig was performed on 04/14/2023 which showed oral rectal mucosa with spontaneous oozing and touch friability.  the patient was discharged home with instructions for nightly steroid enemas for 4 weeks.. Notably, the patient had a prolonged hospitalization from 01/27/2023 to 03/09/2023 for fulminant close treatment of his colitis with perforation and pelvic abscesses requiring colectomy on 02/11/2023.  That hospitalization was prolonged secondary to Candida glabrata fungemia.  Her blood cultures remain negative at the time of discharge.  In the ED, the patient was afebrile with oxygen saturation 100% room air.  Her blood pressures remained soft with SBP in the 80s and lower 90s.  WBC 10.3, hemoglobin 11.7, platelets 462.  Sodium 139, potassium 3.4, bicarbonate 26, serum creatinine 2.44.  Ammonia 11, lactic  acid 2.5>> 1.7.  CT abdomen pelvis was negative for any acute findings.  It showed postoperative changes from the total colectomy with right ventral wall abdominal ileostomy.  The patient was given 2 L normal saline bolus.  She was admitted for further evaluation treatment of her AKI and low blood pressure. Notably, the patient has had history of low blood pressures.  Review of medical record shows that the patient has soft blood pressures during her last hospitalization in the low 90s.  Review of Systems: As mentioned in the history of present illness. All other systems reviewed and are negative. 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   GERD (gastroesophageal reflux disease)    EGD 01/2007 by Dr.Rourke small hiatal hernia s/p 56 french maloney    History of head injury    Hypertension    IBS (irritable bowel syndrome)    PSVT (paroxysmal supraventricular tachycardia) (HCC)    PTSD (post-traumatic stress disorder)    Recurrent chest pain    Seizure disorder (HCC)    Type 2 diabetes mellitus (HCC)    Past Surgical History:  Procedure Laterality Date   ABDOMINAL HYSTERECTOMY     with right salpingo oophorectomy 2005   APPENDECTOMY  2006   BALLOON DILATION N/A 02/19/2021   Procedure: BALLOON DILATION;  Surgeon: Lanelle Bal, DO;  [facility];  Service: Endoscopy;  Laterality: N/A;  Sigmoid colon stricture   BIOPSY  02/06/2021  Procedure: BIOPSY;  Surgeon: Lanelle Bal, DO;  [facility]: [facility];  Service: Endoscopy;;   BIOPSY  02/19/2021   Procedure: BIOPSY;  Surgeon: Lanelle Bal, DO;  [facility];  Service: Endoscopy;;   BIOPSY  07/15/2021   Procedure: BIOPSY;  Surgeon: Malissa Hippo, MD;  [facility];  Service: Endoscopy;;   BIOPSY   04/08/2023   Procedure: BIOPSY;  Surgeon: Corbin Ade, MD;  [facility];  Service: Endoscopy;;   CESAREAN SECTION     1996   CHOLECYSTECTOMY     COLECTOMY N/A 02/11/2023   Procedure: TOTAL COLECTOMY;  Surgeon: Harriette Bouillon, MD;  Location: [facility];  Service: General;  Laterality: N/A;   COLONOSCOPY  2010   Dr. Jena Gauss; negative except for hemorrhoids   COLOSTOMY  02/11/2023   Procedure: COLOSTOMY;  Surgeon: Harriette Bouillon, MD;  Location: [facility];  Service: General;;   ESOPHAGEAL DILATION N/A 10/25/2014   Procedure: ESOPHAGEAL DILATION;  Surgeon: Malissa Hippo, MD;  [facility];  Service: Endoscopy;  Laterality: N/A;   ESOPHAGOGASTRODUODENOSCOPY N/A 10/25/2014   Procedure: ESOPHAGOGASTRODUODENOSCOPY (EGD);  Surgeon: Malissa Hippo, MD;  [facility];  Service: Endoscopy;  Laterality: N/A;  1250   FLEXIBLE SIGMOIDOSCOPY N/A 02/06/2021   Procedure: FLEXIBLE SIGMOIDOSCOPY;  Surgeon: Lanelle Bal, DO;  [facility];  Service: Endoscopy;  Laterality: N/A;   FLEXIBLE SIGMOIDOSCOPY N/A 02/19/2021   Procedure: FLEXIBLE SIGMOIDOSCOPY;  Surgeon: Lanelle Bal, DO;  [facility];  Service: Endoscopy;  Laterality: N/A;   FLEXIBLE SIGMOIDOSCOPY N/A 02/02/2023   Procedure: FLEXIBLE SIGMOIDOSCOPY;  Surgeon: Beverley Fiedler, MD;  Location: [facility] ENDOSCOPY;  Service: Gastroenterology;  Laterality: N/A;   FLEXIBLE SIGMOIDOSCOPY N/A 02/10/2023   Procedure: FLEXIBLE SIGMOIDOSCOPY;  Surgeon: Sherrilyn Rist, MD;  Location: [facility] ENDOSCOPY;  Service: Gastroenterology;  Laterality: N/A;   FLEXIBLE SIGMOIDOSCOPY N/A 04/08/2023   Procedure: FLEXIBLE SIGMOIDOSCOPY;  Surgeon: Corbin Ade, MD;  [facility];  Service: Endoscopy;  Laterality: N/A;   IR FLUORO GUIDE CV LINE LEFT  02/21/2023   IR US GUIDE VASC ACCESS LEFT  02/21/2023   LAPAROTOMY N/A 02/11/2023   Procedure: EXPLORATORY LAPAROTOMY;  Surgeon: Harriette Bouillon, MD;  Location: [facility];   Service: General;  Laterality: N/A;   OOPHORECTOMY     left for torsion and ovarian fibroma; uterine myoma resected 1995   SIGMOIDOSCOPY  07/15/2021   Procedure: SIGMOIDOSCOPY;  Surgeon: Malissa Hippo, MD;  [facility];  Service: Endoscopy;;   TRANSESOPHAGEAL ECHOCARDIOGRAM (CATH LAB) N/A 02/18/2023   Procedure: TRANSESOPHAGEAL ECHOCARDIOGRAM;  Surgeon: Sande Rives, MD;  [facility] INVASIVE CV LAB;  Service: Cardiovascular;  Laterality: N/A;   Social History:  reports that she has been smoking cigarettes. She started smoking about 38 years ago. She has a 19.3 pack-year smoking history. She has been exposed to tobacco smoke. She has never used smokeless tobacco. She reports current drug use. Drug: Marijuana. She reports that she does not drink alcohol.  Allergies  Allergen Reactions   Codeine Shortness Of Breath, Swelling and Rash    Throat swelling   Hydrocodone Shortness Of Breath, Swelling and Rash   Ketorolac Nausea And Vomiting, Swelling and Nausea Only    Pt reports n/v and swelling to throat   Morphine And Codeine Shortness Of Breath, Swelling and Rash   Penicillins Shortness Of Breath, Swelling and Other (See Comments)    Throat  swells 02/06/21--TOLERATES CEFTRIAXONE     Nickel Rash   Pregabalin Other (See Comments)   Acetaminophen     Per MD patient states she can't take Tylenol because of liver enzymes   Atarax [Hydroxyzine] Itching and Swelling    Mildly swollen throat   Ativan [Lorazepam] Other (See Comments)    Migraines.   Oxycodone-Acetaminophen Nausea Only and Nausea And Vomiting   Strawberry Extract Swelling   Watermelon Concentrate Swelling   Albuterol Palpitations   Benadryl [Diphenhydramine Hcl] Palpitations   Humira [Adalimumab] Rash   Latex Rash   Remicade [Infliximab] Rash    Blisters and Welts    Tramadol Rash    Rash and itching    Family History  Problem Relation Age of Onset   Cancer Mother    Heart failure Mother     Hypertension Mother    Heart attack Mother    Heart attack Father 21   Cancer Father 46   Heart failure Father    Diabetes Father    Crohn's disease Cousin     Prior to Admission medications   Medication Sig Start Date End Date Taking? Authorizing Provider  acetaminophen (TYLENOL) 325 MG tablet Take 2 tablets (650 mg total) by mouth every 6 (six) hours as needed for mild pain or headache (or Fever >/= 101). 01/03/22   Vassie Loll, MD  ALPRAZolam Prudy Feeler) 1 MG tablet Take 1 mg by mouth 3 (three) times daily as needed for anxiety. 07/11/19   [provider]  amitriptyline (ELAVIL) 100 MG tablet Take 100 mg by mouth at bedtime.    [provider]  apixaban (ELIQUIS) 2.5 MG TABS tablet Take 1 tablet (2.5 mg total) by mouth 2 (two) times daily. 04/17/23   Johnson, Clanford L, MD  cetirizine (ZYRTEC) 10 MG tablet Take 10 mg by mouth daily. 01/08/22   [provider]  clotrimazole-betamethasone (LOTRISONE) cream SMARTSIG:Sparingly Topical Twice Daily PRN    [provider]  cyclobenzaprine (FLEXERIL) 10 MG tablet Take 10 mg by mouth 3 (three) times daily as needed.    [provider]  diphenoxylate-atropine (LOMOTIL) 2.5-0.025 MG tablet Take 1 tablet by mouth 4 (four) times daily. 03/19/23   Lanae Boast, MD  DULoxetine (CYMBALTA) 30 MG capsule Take 30 mg by mouth daily. 12/17/21   [provider]  feeding supplement (ENSURE ENLIVE / ENSURE PLUS) LIQD Take 237 mLs by mouth 2 (two) times daily between meals. 01/04/22   Vassie Loll, MD  fentaNYL (DURAGESIC) 25 MCG/HR Place 1 patch onto the skin every 3 (three) days. 03/21/23   [provider]  folic acid (FOLVITE) 1 MG tablet Take 1 tablet (1 mg total) by mouth daily. 10/03/21   Catarina Hartshorn, MD  gabapentin (NEURONTIN) 300 MG capsule TAKE (1) CAPSULE BY MOUTH TWICE DAILY. 10/15/22   [provider]  hydrocortisone (CORTENEMA) 100 MG/60ML enema Place 1 enema (100 mg total) rectally at  bedtime. 04/10/23   Johnson, Clanford L, MD  hydrOXYzine (ATARAX) 50 MG tablet Take 25-50 mg by mouth 3 (three) times daily as needed.    [provider]  Isavuconazonium Sulfate (CRESEMBA) 186 MG CAPS Take 1 capsule (186 mg total) by mouth daily. 04/11/23 05/11/23  Johnson, Clanford L, MD  levETIRAcetam (KEPPRA) 750 MG tablet Take 750 mg by mouth 2 (two) times daily. 09/30/20   [provider]  loperamide (IMODIUM) 2 MG capsule Take 2 capsules (4 mg total) by mouth 4 (four) times daily. 03/19/23   Lanae Boast, MD  metoprolol tartrate (LOPRESSOR) 25 MG tablet Take 0.5 tablets (12.5 mg total) by mouth 2 (two) times daily. 04/10/23   Johnson, Clanford L, MD  midodrine (PROAMATINE) 5 MG tablet Take 1 tablet (5 mg total) by mouth 3 (three) times daily with meals. 04/10/23   Johnson, Clanford L, MD  nitroGLYCERIN (NITROSTAT) 0.4 MG SL tablet Place 1 tablet (0.4 mg total) under the tongue every 5 (five) minutes as needed for chest pain. 03/19/21   Rehman, Joline Maxcy, MD  ondansetron (ZOFRAN-ODT) 4 MG disintegrating tablet Take 4 mg by mouth every 8 (eight) hours as needed for nausea or vomiting. 09/07/21   [provider]  pantoprazole (PROTONIX) 40 MG tablet Take 1 tablet (40 mg total) by mouth 2 (two) times daily. 01/03/22   Vassie Loll, MD  polycarbophil (FIBERCON) 625 MG tablet Take 2 tablets (1,250 mg total) by mouth 2 (two) times daily for 15 days. 04/10/23 04/25/23  Cleora Fleet, MD  prochlorperazine (COMPAZINE) 5 MG tablet Take 1 tablet (5 mg total) by mouth every 6 (six) hours as needed for refractory nausea / vomiting. 04/10/23   Johnson, Clanford L, MD  RESTASIS 0.05 % ophthalmic emulsion Place 1 drop into both eyes 2 (two) times daily. 12/12/20   [provider]  SPIRIVA RESPIMAT 2.5 MCG/ACT AERS Inhale 1 puff into the lungs daily. 01/03/22   Vassie Loll, MD  triazolam (HALCION) 0.25 MG tablet Take 0.25 mg by mouth daily.    [provider]  Vitamin D,  Ergocalciferol, (DRISDOL) 1.25 MG (50000 UNIT) CAPS capsule Take 50,000 Units by mouth once a week. Takes on Wednesdays. 12/14/21   [provider]    Physical Exam: Vitals:   04/22/23 0615 04/22/23 0630 04/22/23 0645 04/22/23 0730  BP: (!) 82/52 (!) 82/46 103/85 (!) 94/49  Pulse: 91 89 91 95  Resp: 18 16 16 14   Temp:      TempSrc:      SpO2: 100% 100% 100% 100%  Weight:      Height:       GENERAL:  A&O x 3, NAD, well developed, cooperative, follows commands HEENT: [place]/AT, No thrush, No icterus, No oral ulcers Neck:  No neck mass, No meningismus, soft, supple CV: RRR, no S3, no S4, no rub, no JVD Lungs:  CTA, no wheeze, no rhonchi, good air movement Abd: soft/NT +BS, nondistended Ext: No edema, no lymphangitis, no cyanosis, no rashes Neuro:  CN II-XII intact, strength 4/5 in RUE, RLE, strength 4/5 LUE, LLE; sensation intact bilateral; no dysmetria; babinski equivocal  Data Reviewed: Data reviewed above in history  Assessment and Plan: Acute kidney injury -Secondary to volume depletion -Baseline creatinine 0.5-0.7 -Continue IV fluids   Chronic abdominal pain/opioid dependence -PDMP reviewed -Dilaudid 2 mg, #20 refill 04/11/2023 -Xanax 1 mg, #90, refill 04/21/2023 -Judicious opioids   Hyponatremia -Secondary to volume depletion   Proctitis -Continue hydrocortisone enemas -Hemoglobin is stable and improved from prior admission -Consult GI as the patient has had history of poor outpatient follow-up -She was supposed to see [facility] 2 weeks from her last discharge   Hypokalemia -Check magnesium -replete -add KCl to IVF   Fungemia -C. Ellard Artis -on Cresemba at home   Status post colectomy -Patient had colectomy 02/11/2023 secondary to fulminant C. Difficile -Patient has run out of her ostomy supplies once again -Consult ostomy nurse   Hypotension -Continue midodrine -Continue IV fluids -Check PCT -Blood cultures x 2 sets   Lactic  acidosis -Continue IV fluids>> improved  History of PE/DVT -Continue apixaban   History of cardiac arrest -01/2023 -Remain on telemetry   Thrombocytosis -Previously evaluated by hematology--felt to be inflammatory/reactive -Has been present during prior hospitalizations -Studies at that time included BCR-ABL-1, CML/ALL PCR, JAK2 which were all negative    Seizure disorder -Continue Keppra   Anxiety -Continue home dose alprazolam          Advance Care Planning: FULL  Consults: ID, GI  Family Communication: none  Severity of Illness: The appropriate patient status for this patient is OBSERVATION. Observation status is judged to be reasonable and necessary in order to provide the required intensity of service to ensure the patient's safety. The patient's presenting symptoms, physical exam findings, and initial radiographic and laboratory data in the context of their medical condition is felt to place them at decreased risk for further clinical deterioration. Furthermore, it is anticipated that the patient will be medically stable for discharge from the hospital within 2 midnights of admission.   Author: Catarina Hartshorn, MD 04/22/2023 8:41 AM  For on call review [email or web address].

## 2023-04-22 NOTE — Hospital Course (Addendum)
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  The patient continues to complain of her usual abdominal pain for which she received Dilaudid 2 mg, #20 on 04/11/2023 at the time of her last discharge. The patient was recently hospitalized from 04/03/2023 to 04/10/2023 for septic shock related to her stoma site infection and prior candidemia.  The patient was discharged home with 5 more doses of doxycycline and cefadroxil.  She was also sent home with Cresemba to finish on 04/14/2023.  GI was also consulted secondary to rectal bleeding.  Flex sig was performed on 04/14/2023 which showed oral rectal mucosa with spontaneous oozing and touch friability.  the patient was discharged home with instructions for nightly steroid enemas for 4 weeks.. Notably, the patient had a prolonged hospitalization from 01/27/2023 to 03/09/2023 for fulminant close treatment of his colitis with perforation and pelvic abscesses requiring colectomy on 02/11/2023.  That hospitalization was prolonged secondary to Candida glabrata fungemia.  Her blood cultures remain negative at the time of discharge.  In the ED, the patient was afebrile with oxygen saturation 100% room air.  Her blood pressures remained soft with SBP in the 80s and lower 90s.  WBC 10.3, hemoglobin 11.7, platelets 462.  Sodium 139, potassium 3.4, bicarbonate 26, serum creatinine 2.44.  Ammonia 11, lactic acid 2.5>> 1.7.  CT abdomen pelvis was negative for any acute findings.  It showed postoperative changes from the total colectomy with right ventral wall abdominal ileostomy.  The patient was given 2 L normal saline bolus.  She was admitted for further evaluation treatment of her AKI and low blood pressure. Notably, the  patient has had history of low blood pressures.  Review of medical record shows that the patient has soft blood pressures during her last hospitalization in the low 90s.

## 2023-04-22 NOTE — Telephone Encounter (Signed)
-----   Message from [name] sent at 04/22/2023 11:34 AM EST ----- Regarding: FW: Patient called Communication noted regarding ostomy resource.  As far as mesalamine enemas are concerned, please send in a prescription for mesalamine enemas 4 g.  Dispense 30 with 1 refill  ;  one per rectum at bedtime. ----- Message ----- From: Gatha Mayer, CMA Sent: 04/22/2023  10:00 AM EST To: Corbin Ade, MD Subject: RE: Patient called                             According to the pt's chart, [facility] is handling getting her ostomy supplies. Are you wanting me to send in mesalamine to the pharmacy for the pt? If so what is the qty? ----- Message ----- From: Corbin Ade, MD Sent: 04/22/2023   9:44 AM EST To: Catarina Hartshorn, MD; Gatha Mayer, CMA; # Subject: RE: Patient called                             This patient has been very difficult to deal with as an outpatient.  She has had some recent extremely serious complications related to her Crohn's disease.  FS recently demonstrated bloody friable ulcerated rectal stump.  Biopsies severe inflammation/ulcer.  Working diagnosis diversion proctitis.  She was supposed to have gone home with cortisone enemas pending results of path.  She called yesterday and stated she was not given Cort enemas upon discharge.  Based on path, I had recommended she then be transition to butyrate enemas.  This can only be obtained via compounding in [place].  Patient states she could not go to [place] and had no way to get this medication.  My compromise would be recommending mesalamine enemas 1 per rectum nightly.  Now she called in last night about lack of colostomy supplies.  And it appears she is in the ED once again.  This lady needs regular home health support.  She would benefit from [facility] nurse specialist as an advocate in the outpatient setting.  Historically, she has fairly poor insight into her health issues and not very engaged in her own  care. ----- Message ----- From: Franky Macho, MD Sent: 04/21/2023   9:25 PM EST To: Corbin Ade, MD Subject: Patient called                                 Hi Dr Jena Gauss   This patient called me at this time of the night through operator asking to speak to you .   I see Dr Levon Hedger is supposed to be her primary GI .   In any case looks like she had colostomy and no follow up by surgery etc , all she is asking is her supplies for colostomy bag . She reached out to her PCP who said to follow up with GI   Any idea how we can get her ostomy supplies ?

## 2023-04-22 NOTE — ED Notes (Signed)
Patients BP even lower now than when first arriving 84/65. Dr. Pilar Plate notified.

## 2023-04-22 NOTE — ED Notes (Signed)
Georgette Shell, RN at beside changing colostomy bag per [facility] RN instructions

## 2023-04-22 NOTE — ED Notes (Signed)
Tat, MD notified via face to face communication re: pt stoma temp, oral to place warm blankets and reassess

## 2023-04-22 NOTE — Telephone Encounter (Signed)
Rx was sent to [facility]

## 2023-04-22 NOTE — ED Notes (Signed)
MD notified re: pt rectal temp, continue with bair hugger, will reassess

## 2023-04-22 NOTE — ED Notes (Signed)
Pt remains hypothermic, Tat, MD notified

## 2023-04-23 ENCOUNTER — Inpatient Hospital Stay (HOSPITAL_COMMUNITY): Payer: Medicaid Other

## 2023-04-23 ENCOUNTER — Other Ambulatory Visit: Payer: Self-pay

## 2023-04-23 DIAGNOSIS — R6510 Systemic inflammatory response syndrome (SIRS) of non-infectious origin without acute organ dysfunction: Secondary | ICD-10-CM | POA: Diagnosis not present

## 2023-04-23 DIAGNOSIS — F17200 Nicotine dependence, unspecified, uncomplicated: Secondary | ICD-10-CM | POA: Diagnosis not present

## 2023-04-23 DIAGNOSIS — N179 Acute kidney failure, unspecified: Secondary | ICD-10-CM | POA: Diagnosis not present

## 2023-04-23 DIAGNOSIS — B49 Unspecified mycosis: Secondary | ICD-10-CM | POA: Diagnosis not present

## 2023-04-23 LAB — URINALYSIS, W/ REFLEX TO CULTURE (INFECTION SUSPECTED)
Bilirubin Urine: NEGATIVE
Glucose, UA: NEGATIVE mg/dL
Hgb urine dipstick: NEGATIVE
Ketones, ur: NEGATIVE mg/dL
Nitrite: NEGATIVE
Protein, ur: NEGATIVE mg/dL
Specific Gravity, Urine: 1.009 (ref 1.005–1.030)
pH: 5 (ref 5.0–8.0)

## 2023-04-23 LAB — CBC
HCT: 28.2 % — ABNORMAL LOW (ref 36.0–46.0)
Hemoglobin: 9 g/dL — ABNORMAL LOW (ref 12.0–15.0)
MCH: 26.8 pg (ref 26.0–34.0)
MCHC: 31.9 g/dL (ref 30.0–36.0)
MCV: 83.9 fL (ref 80.0–100.0)
Platelets: 608 10*3/uL — ABNORMAL HIGH (ref 150–400)
RBC: 3.36 MIL/uL — ABNORMAL LOW (ref 3.87–5.11)
RDW: 15.6 % — ABNORMAL HIGH (ref 11.5–15.5)
WBC: 7.9 10*3/uL (ref 4.0–10.5)
nRBC: 0 % (ref 0.0–0.2)

## 2023-04-23 LAB — MAGNESIUM: Magnesium: 1 mg/dL — ABNORMAL LOW (ref 1.7–2.4)

## 2023-04-23 LAB — TSH: TSH: 0.902 u[IU]/mL (ref 0.350–4.500)

## 2023-04-23 LAB — BASIC METABOLIC PANEL
Anion gap: 12 (ref 5–15)
BUN: 11 mg/dL (ref 6–20)
CO2: 24 mmol/L (ref 22–32)
Calcium: 7.9 mg/dL — ABNORMAL LOW (ref 8.9–10.3)
Chloride: 97 mmol/L — ABNORMAL LOW (ref 98–111)
Creatinine, Ser: 1.06 mg/dL — ABNORMAL HIGH (ref 0.44–1.00)
GFR, Estimated: >60 mL/min (ref >60)
Glucose, Bld: 98 mg/dL (ref 70–99)
Potassium: 3.1 mmol/L — ABNORMAL LOW (ref 3.5–5.1)
Sodium: 133 mmol/L — ABNORMAL LOW (ref 135–145)

## 2023-04-23 LAB — RAPID URINE DRUG SCREEN, HOSP PERFORMED
Amphetamines: NOT DETECTED
Barbiturates: NOT DETECTED
Benzodiazepines: POSITIVE — AB
Cocaine: NOT DETECTED
Opiates: POSITIVE — AB
Tetrahydrocannabinol: NOT DETECTED

## 2023-04-23 LAB — T4, FREE: Free T4: 0.88 ng/dL (ref 0.61–1.12)

## 2023-04-23 LAB — CORTISOL-AM, BLOOD: Cortisol - AM: 6.3 ug/dL — ABNORMAL LOW (ref 6.7–22.6)

## 2023-04-23 MED ORDER — COSYNTROPIN 0.25 MG IJ SOLR
0.2500 mg | Freq: Once | INTRAMUSCULAR | Status: AC
  Administered 2023-04-24: 0.25 mg via INTRAVENOUS
  Filled 2023-04-23: qty 0.25

## 2023-04-23 MED ORDER — POTASSIUM CHLORIDE IN NACL 20-0.9 MEQ/L-% IV SOLN: INTRAVENOUS | Status: AC

## 2023-04-23 MED ORDER — POTASSIUM CHLORIDE CRYS ER 20 MEQ PO TBCR
40.0000 meq | EXTENDED_RELEASE_TABLET | Freq: Two times a day (BID) | ORAL | Status: DC
  Administered 2023-04-23 – 2023-04-24 (×3): 40 meq via ORAL
  Filled 2023-04-23 (×3): qty 2

## 2023-04-23 MED ORDER — SODIUM CHLORIDE 0.9% FLUSH
10.0000 mL | Freq: Two times a day (BID) | INTRAVENOUS | Status: DC
  Administered 2023-04-23 (×2): 10 mL
  Administered 2023-04-24: 20 mL
  Administered 2023-04-24 – 2023-04-25 (×2): 10 mL

## 2023-04-23 MED ORDER — ALPRAZOLAM 0.5 MG PO TABS
0.5000 mg | ORAL_TABLET | Freq: Three times a day (TID) | ORAL | Status: DC | PRN
  Administered 2023-04-23 – 2023-04-25 (×4): 0.5 mg via ORAL
  Filled 2023-04-23 (×4): qty 1

## 2023-04-23 MED ORDER — MAGNESIUM OXIDE -MG SUPPLEMENT 400 (240 MG) MG PO TABS
400.0000 mg | ORAL_TABLET | Freq: Two times a day (BID) | ORAL | Status: DC
  Administered 2023-04-23 – 2023-04-25 (×5): 400 mg via ORAL
  Filled 2023-04-23 (×5): qty 1

## 2023-04-23 MED ORDER — HYDROMORPHONE HCL 2 MG PO TABS
1.0000 mg | ORAL_TABLET | ORAL | Status: DC | PRN
  Administered 2023-04-23 – 2023-04-24 (×4): 1 mg via ORAL
  Filled 2023-04-23 (×5): qty 1

## 2023-04-23 MED ORDER — SODIUM CHLORIDE 0.9% FLUSH: 10.0000 mL | INTRAVENOUS | Status: DC | PRN

## 2023-04-23 MED ORDER — SODIUM CHLORIDE 0.9 % IV BOLUS
500.0000 mL | Freq: Once | INTRAVENOUS | Status: AC
  Administered 2023-04-23: 500 mL via INTRAVENOUS

## 2023-04-23 MED ORDER — LACTATED RINGERS IV BOLUS
1000.0000 mL | Freq: Once | INTRAVENOUS | Status: AC
  Administered 2023-04-23: 1000 mL via INTRAVENOUS

## 2023-04-23 MED ORDER — MAGNESIUM SULFATE 2 GM/50ML IV SOLN
2.0000 g | Freq: Once | INTRAVENOUS | Status: AC
  Administered 2023-04-23: 2 g via INTRAVENOUS
  Filled 2023-04-23: qty 50

## 2023-04-23 NOTE — Progress Notes (Addendum)
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  The patient continues to complain of her usual abdominal pain for which she received Dilaudid 2 mg, #20 on 04/11/2023 at the time of her last discharge. The patient was recently hospitalized from 04/03/2023 to 04/10/2023 for septic shock related to her stoma site infection and prior candidemia.  The patient was discharged home with 5 more doses of doxycycline and cefadroxil.  She was also sent home with Cresemba to finish on 04/14/2023.  GI was also consulted secondary to rectal bleeding.  Flex sig was performed on 04/14/2023 which showed oral rectal mucosa with spontaneous oozing and touch friability.  the patient was discharged home with instructions for nightly steroid enemas for 4 weeks.. Notably, the patient had a prolonged hospitalization from 01/27/2023 to 03/09/2023 for fulminant close treatment of his colitis with perforation and pelvic abscesses requiring colectomy on 02/11/2023.  That hospitalization was prolonged secondary to Candida glabrata fungemia.  Her blood cultures remain negative at the time of discharge.  In the ED, the patient was afebrile with oxygen saturation 100% room air.  Her blood pressures remained soft with SBP in the 80s and lower 90s.  WBC 10.3, hemoglobin 11.7, platelets 462.  Sodium 139, potassium 3.4, bicarbonate 26, serum creatinine 2.44.  Ammonia 11, lactic acid 2.5>> 1.7.  CT abdomen pelvis was negative for any acute findings.  It showed postoperative changes from the total colectomy with right ventral wall abdominal ileostomy.  The  patient was given 2 L normal saline bolus.  She was admitted for further evaluation treatment of her AKI and low blood pressure. Notably, the patient has had history of low blood pressures.  Review of medical record shows that the patient has soft blood pressures during her last hospitalization in the low 90s.   Assessment/Plan: Acute kidney injury -Secondary to volume depletion -Baseline creatinine 0.5-0.7 -Continue IV fluids>>improved   Chronic abdominal pain/opioid dependence -PDMP reviewed -Dilaudid 2 mg, #20 refill 04/11/2023 -Xanax 1 mg, #90, refill 04/21/2023 -Judicious opioids   Hyponatremia -Secondary to volume depletion -continue isotonic saline>>improved   Proctitis -Continue hydrocortisone enemas -Hemoglobin is stable and improved from prior admission -Consult GI as the patient has had history of poor outpatient follow-up -She was supposed to see [facility] 2 weeks from her last discharge -mesalamine enemas started   Hypokalemia/Hypomagnesemia -Check magnesium -replete -add KCl to IVF   Fungemia -C. Ellard Artis -on Cresemba at home>>continue   Status post colectomy -Patient had colectomy 02/11/2023 secondary to fulminant C. Difficile -Patient has run out of her ostomy supplies once again -Consult ostomy nurse>>appreciate input   Hypotension -Continue midodrine -Continue IV fluids -Check PCT 0.13 -Blood cultures x 2 sets--neg to date   Lactic acidosis -Continue IV fluids>> improved -UA--no pyuria -blood culture--neg to date -CXR--no infiltrates   History of PE/DVT -Continue apixaban   History of cardiac arrest -01/2023 -Remain on telemetry   Thrombocytosis -Previously evaluated by hematology--felt to be inflammatory/reactive -Has been present during prior hospitalizations -Studies at that time included BCR-ABL-1, CML/ALL PCR, JAK2 which were all negative    Seizure disorder -Continue Keppra   Anxiety -Continue home dose alprazolam  Poor  IV access -cVL placed by Dr. Lovell Sheehan 12/21        Family Communication: no  Family at bedside  Consultants:  none  Code Status:  FULL  DVT Prophylaxis:  apixaban   Procedures: As Listed in Progress Note Above  Antibiotics: None       Subjective: Pt complains of abd pain.  Denies f/c, cp, n/v/d, sob  Objective: Vitals:   04/23/23 1045 04/23/23 1130 04/23/23 1200 04/23/23 1215  BP: (!) 83/34 (!) 98/44 (!) 102/53 (!) 102/59  Pulse: (!) 107 (!) 108 (!) 116 (!) 114  Resp:  (!) 26    Temp:   98.1 F (36.7 C)   TempSrc:   Oral   SpO2: 100% 100% 100% 100%  Weight:      Height:        Intake/Output Summary (Last 24 hours) at 04/23/2023 1257 Last data filed at 04/23/2023 0846 Gross per 24 hour  Intake 1869.43 ml  Output 2700 ml  Net -830.57 ml   Weight change: 0.7 kg Exam:  General:  Pt is alert, follows commands appropriately, not in acute distress HEENT: No icterus, No thrush, No neck mass, [place]/AT Cardiovascular: RRR, S1/S2, no rubs, no gallops Respiratory: CTA bilaterally, no wheezing, no crackles, no rhonchi Abdomen: Soft/+BS, mild tender at ostomy site, non distended, no guarding Extremities: No edema, No lymphangitis, No petechiae, No rashes, no synovitis   Data Reviewed: I have personally reviewed following labs and imaging studies Basic Metabolic Panel: Recent Labs  Lab 04/22/23 0330 04/23/23 0508  NA 129* 133*  K 3.4* 3.1*  CL 86* 97*  CO2 26 24  GLUCOSE 108* 98  BUN 18 11  CREATININE 2.44* 1.06*  CALCIUM 8.9 7.9*  MG  --  1.0*   Liver Function Tests: Recent Labs  Lab 04/22/23 0330  AST 122*  ALT 78*  ALKPHOS 180*  BILITOT 0.8  PROT 6.8  ALBUMIN 3.0*   No results for input(s): "LIPASE", "AMYLASE" in the last 168 hours. Recent Labs  Lab 04/22/23 0512  AMMONIA 11   Coagulation Profile: No results for input(s): "INR", "PROTIME" in the last 168 hours. CBC: Recent Labs  Lab 04/22/23 0330 04/23/23 0508  WBC 10.3 7.9  HGB  11.7* 9.0*  HCT 37.2 28.2*  MCV 83.8 83.9  PLT 462* 608*   Cardiac Enzymes: No results for input(s): "CKTOTAL", "CKMB", "CKMBINDEX", "TROPONINI" in the last 168 hours. BNP: Invalid input(s): "POCBNP" CBG: No results for input(s): "GLUCAP" in the last 168 hours. HbA1C: No results for input(s): "HGBA1C" in the last 72 hours. Urine analysis:    Component Value Date/Time   COLORURINE YELLOW 04/23/2023 0115   APPEARANCEUR CLEAR 04/23/2023 0115   LABSPEC 1.009 04/23/2023 0115   PHURINE 5.0 04/23/2023 0115   GLUCOSEU NEGATIVE 04/23/2023 0115   GLUCOSEU NEG mg/dL 70/35/0093 8182   HGBUR NEGATIVE 04/23/2023 0115   BILIRUBINUR NEGATIVE 04/23/2023 0115   KETONESUR NEGATIVE 04/23/2023 0115   PROTEINUR NEGATIVE 04/23/2023 0115   UROBILINOGEN 0.2 12/26/2013 1530   NITRITE NEGATIVE 04/23/2023 0115   LEUKOCYTESUR TRACE (A) 04/23/2023 0115   Sepsis Labs: @LABRCNTIP (procalcitonin:4,lacticidven:4) ) Recent Results (from the past 240 hours)  Culture, blood (single) w Reflex to ID Panel     Status: None (Preliminary result)   Collection Time: 04/22/23  9:18 AM   Specimen: BLOOD  Result Value Ref Range Status   Specimen Description BLOOD BOTTLES DRAWN AEROBIC ONLY  Final   Special Requests Blood Culture adequate volume RT HAND  Final  Culture   Final    NO GROWTH < 24 HOURS Performed at [facility], [address], [place], [place] [address]    Report Status PENDING  Incomplete  MRSA Next Gen by PCR, Nasal     Status: None   Collection Time: 04/22/23  3:33 PM   Specimen: Nasal Mucosa; Nasal Swab  Result Value Ref Range Status   MRSA by PCR Next Gen NOT DETECTED NOT DETECTED Final    Comment: (NOTE) The GeneXpert MRSA Assay (FDA approved for NASAL specimens only), is one component of a comprehensive MRSA colonization surveillance program. It is not intended to diagnose MRSA infection nor to guide or monitor treatment for MRSA infections. Test performance is not FDA approved in  patients less than 89 years old. Performed at [facility], [address], [place], [place] [address]      Scheduled Meds:  apixaban  2.5 mg Oral BID   Chlorhexidine Gluconate Cloth  6 each Topical Daily   cycloSPORINE  1 drop Both Eyes BID   DULoxetine  30 mg Oral Daily   feeding supplement  237 mL Oral BID BM   folic acid  1 mg Oral Daily   Isavuconazonium Sulfate  186 mg Oral Daily   levETIRAcetam  750 mg Oral BID   loratadine  10 mg Oral Daily   mesalamine  4 g Rectal QHS   midodrine  5 mg Oral TID WC   pantoprazole  40 mg Oral BID   potassium chloride  40 mEq Oral BID   sodium chloride flush  10-40 mL Intracatheter Q12H   Continuous Infusions:  Procedures/Studies: DG Chest Port 1 View Result Date: 04/23/2023 CLINICAL DATA:  Central line placement EXAM: PORTABLE CHEST 1 VIEW COMPARISON:  04/03/2023 FINDINGS: Left subclavian CVC tip in the mid SVC. Stable cardiomediastinal silhouette. No focal consolidation, pleural effusion, or pneumothorax. No displaced rib fractures. IMPRESSION: Left subclavian CVC tip in the mid SVC. No pneumothorax. Electronically Signed   By: Minerva Fester M.D.   On: 04/23/2023 11:55   [place] EKG SITE RITE Result Date: 04/23/2023 If Site Rite image not attached, placement could not be confirmed due to current cardiac rhythm.  CT ABDOMEN PELVIS WO CONTRAST Result Date: 04/22/2023 CLINICAL DATA:  Abdominal pain EXAM: CT ABDOMEN AND PELVIS WITHOUT CONTRAST TECHNIQUE: Multidetector CT imaging of the abdomen and pelvis was performed following the standard protocol without IV contrast. RADIATION DOSE REDUCTION: This exam was performed according to the departmental dose-optimization program which includes automated exposure control, adjustment of the mA and/or kV according to patient size and/or use of iterative reconstruction technique. COMPARISON:  04/03/2023 FINDINGS: Lower chest: No pleural effusion or consolidative change. Hepatobiliary: There is diffuse  hepatic steatosis with peripheral, wedge-shaped area of focal fatty sparing along segment 7 of the dome of liver. Cholecystectomy. No significant biliary ductal dilatation. Pancreas: Unremarkable. No pancreatic ductal dilatation or surrounding inflammatory changes. Spleen: Normal in size without focal abnormality. Adrenals/Urinary Tract: Normal adrenal glands. There is no nephrolithiasis, hydronephrosis or suspicious mass. Stomach/Bowel: Stomach filled with ingested debris. Stomach filled with ingested material. There are postsurgical changes from total colectomy with right ventral abdominal wall ileostomy. There is no pathologic dilatation of the small bowel loops. No wall thickening or inflammatory changes. Vascular/Lymphatic: Normal appearance of the abdominal aorta. No signs of abdominopelvic adenopathy. Reproductive: Status post hysterectomy. No adnexal masses. Other: There is no free fluid, fluid collections. No signs of pneumoperitoneum. Residual thickening along the peritoneal reflections with mild persistent soft tissue  stranding/edema within the mesenteric fat. No signs of abscess. No pneumoperitoneum identified. Musculoskeletal: No acute or significant osseous findings. IMPRESSION: 1. No acute findings within the abdomen or pelvis. 2. Postsurgical changes from total colectomy with right ventral abdominal wall ileostomy. No signs of bowel obstruction. 3. Residual thickening along the peritoneal reflections with mild persistent soft tissue stranding/edema within the mesenteric fat. No signs of abscess. 4. Hepatic steatosis. Electronically Signed   By: Signa Kell M.D.   On: 04/22/2023 05:39   CT ABDOMEN PELVIS WO CONTRAST Result Date: 04/03/2023 CLINICAL DATA:  Abdominal pain.  Hypotension. EXAM: CT ABDOMEN AND PELVIS WITHOUT CONTRAST TECHNIQUE: Multidetector CT imaging of the abdomen and pelvis was performed following the standard protocol without IV contrast. RADIATION DOSE REDUCTION: This exam was  performed according to the departmental dose-optimization program which includes automated exposure control, adjustment of the mA and/or kV according to patient size and/or use of iterative reconstruction technique. COMPARISON:  CT 02/25/2023. FINDINGS: Lower chest: Improved left pleural effusion compared to previous. Improving left lung opacity. There is some mild opacity residual in the lingula dependently. There is also some opacity along the anterior aspect of the right lower lobe. A focal infiltrates possible. Recommend follow-up. Breathing motion. Heart is slightly enlarged. Trace pericardial fluid. Hepatobiliary: Heterogeneous liver with some fatty infiltration. Previous cholecystectomy. Pancreas: Unremarkable. No pancreatic ductal dilatation or surrounding inflammatory changes. Spleen: Normal in size without focal abnormality. Adrenals/Urinary Tract: Stable slight thickening of the left adrenal gland. Right adrenal gland is preserved. No abnormal calcifications are seen within either kidney nor along the course of either ureter. Preserved contours of the distended urinary bladder there is persistent wall thickening of the bladder particularly superiorly as seen on coronal series 5, image 49 please correlate for any known history or further workup when appropriate Stomach/Bowel: Right lower quadrant ileostomy identified. On this non oral contrast exam the stomach and small bowel are nondilated. Large bowel is relatively decompressed as well. The previous drain is no longer identified. Areas of stranding along the mesentery on left side are decreasing with significant residual. No free air or free fluid. Vascular/Lymphatic: Normal caliber aorta and IVC. Minimal vascular calcifications no discrete abnormal lymph node enlargement identified in the abdomen and pelvis. Previous small node aortocaval measuring 8 mm in short axis on the prior, today measures 6 mm on series 2 image 34. Reproductive: Status post  hysterectomy. No adnexal masses. Other: Please see above Musculoskeletal: Curvature of the spine. Scattered degenerative changes. IMPRESSION: Decreasing mesenteric stranding, fluid with some residual. Previous drain is also no longer present. Right lower quadrant ostomy. No bowel obstruction.  The bowel overall is relatively decompressed. No obstructing renal stone. Improving pleural effusions and lung opacities with some residual opacity in the right lower lobe and lingula. Please correlate for signs of infiltrative recommend continued follow-up. Electronically Signed   By: Karen Kays M.D.   On: 04/03/2023 13:17   DG Chest Port 1 View Result Date: 04/03/2023 CLINICAL DATA:  Possible sepsis. EXAM: PORTABLE CHEST 1 VIEW COMPARISON:  February 28, 2023. FINDINGS: The heart size and mediastinal contours are within normal limits. Left internal jugular catheter is noted with distal tip in expected position of the SVC. Both lungs are clear. The visualized skeletal structures are unremarkable. IMPRESSION: No active disease. Electronically Signed   By: Lupita Raider M.D.   On: 04/03/2023 12:42    Catarina Hartshorn, DO  [facility]  If 7PM-7AM, please contact night-coverage [email or web address] Password [facility] 04/23/2023, 12:57 PM  LOS: 1 day \

## 2023-04-23 NOTE — Progress Notes (Signed)
Patient assigned to me at 2100. When reviewing her chart and old vitals, I accidentally validated multiple columns of vitals way before I was assigned as this patient's RN. Validations by me prior to 2100 were made in error.

## 2023-04-23 NOTE — Progress Notes (Signed)
Spoke with MD Tat this morning. PIV infiltrated and no other PIV attempts successful. We were awaiting SWOT RN for US guided IV placement but patient remains hypotensive at this time.Marland Kitchen PICC team stating not a candidate, Per MD Tat, MD Lovell Sheehan to come to bedside for CVC placement.Marland Kitchen

## 2023-04-23 NOTE — Progress Notes (Signed)
04/23/2023 at 0607 lab called a delta value. Creatinine decreased from 2.44 to 1.06. Pt received multiple boluses and and had IVF infusing. Primary RN notified.

## 2023-04-23 NOTE — Plan of Care (Signed)

## 2023-04-23 NOTE — Progress Notes (Signed)
Secure chat sent to Dr Tat and Zollie Scale RN re +DVT BUE in October 2024 and hx of IR having difficulty placing PICC via RUE then deferring to central placement.  Recommendations for CVC for meds needed at this time vs IR for central PICC placement.

## 2023-04-23 NOTE — Progress Notes (Signed)
Patient showing aggression to the RN for not giving her pain meds through the CVC, states, why do I have this Central line if you are not giving any IV pain meds through the line, RN explains that she has other IV meds and continuous IVF that goes through the line and that is also for possible pressors she might end up since her BP been low all the time since she's been here, and also she's not been a candidate for PICC since she had B/L DVT, Patient isn't assured and shows frustration and anger. RN also explained there's no difference in the strength of pain medication she gets either through IV or PO, but just the duration of action. This RN was also explaining about her lab results and her UDS result which is positive for Benz and opiates, and the  patient got intimidated again.  Patient  hasn't been  very cooperative with this RN and demanded change of RN, explained this RN is doing everything to help her but will respect her decision too. Charge RN made aware and switched Rns.

## 2023-04-23 NOTE — Op Note (Addendum)
Patient:  Perlita Kohlenberg  DOB:  January 10, 1971  MRN:  [id number]   Preop Diagnosis: Hypotension, need for central venous access, acute kidney injury  Postop Diagnosis: Same  Procedure: Central line placement  Surgeon: Franky Macho, MD  Anes: Local  Indications: Patient is a 52 year old white female in the ICU who is in need for central IV access due to hypotension and acute kidney injury.  The risks and benefits of the procedure including bleeding, infection, pneumothorax were fully explained to the patient, who gave informed consent.  Procedure note: The patient was placed in the Trendelenburg position at bedside ICU 3.  Sterile technique was used.  The left upper chest was prepped and draped using the usual sterile technique with ChloraPrep.  Surgical site confirmation was performed.  1% Xylocaine was used for local anesthesia.  The left subclavian vein was accessed using the Seldinger technique without difficulty.  A guidewire was then advanced.  An introducer was placed over the guidewire.  A triple-lumen catheter was placed at the 17 cm Artyom Stencel without difficulty.  Good backflow of venous blood was noted on aspiration of the port.  All 3 ports were flushed with saline.  It was secured in the place using 3-0 silk sutures.  The patient tolerated the procedure well.  A dry sterile dressing was applied.  A chest x-ray has been ordered.  Complications: None  EBL: Minimal  Specimen: None

## 2023-04-24 DIAGNOSIS — N179 Acute kidney failure, unspecified: Secondary | ICD-10-CM | POA: Diagnosis not present

## 2023-04-24 DIAGNOSIS — D75839 Thrombocytosis, unspecified: Secondary | ICD-10-CM | POA: Diagnosis not present

## 2023-04-24 DIAGNOSIS — F17200 Nicotine dependence, unspecified, uncomplicated: Secondary | ICD-10-CM | POA: Diagnosis not present

## 2023-04-24 DIAGNOSIS — R6510 Systemic inflammatory response syndrome (SIRS) of non-infectious origin without acute organ dysfunction: Secondary | ICD-10-CM | POA: Diagnosis not present

## 2023-04-24 LAB — BASIC METABOLIC PANEL
Anion gap: 7 (ref 5–15)
BUN: 5 mg/dL — ABNORMAL LOW (ref 6–20)
CO2: 21 mmol/L — ABNORMAL LOW (ref 22–32)
Calcium: 8.3 mg/dL — ABNORMAL LOW (ref 8.9–10.3)
Chloride: 104 mmol/L (ref 98–111)
Creatinine, Ser: 0.69 mg/dL (ref 0.44–1.00)
GFR, Estimated: >60 mL/min (ref >60)
Glucose, Bld: 85 mg/dL (ref 70–99)
Potassium: 4.7 mmol/L (ref 3.5–5.1)
Sodium: 132 mmol/L — ABNORMAL LOW (ref 135–145)

## 2023-04-24 LAB — CBC
HCT: 26.2 % — ABNORMAL LOW (ref 36.0–46.0)
Hemoglobin: 8.4 g/dL — ABNORMAL LOW (ref 12.0–15.0)
MCH: 27.3 pg (ref 26.0–34.0)
MCHC: 32.1 g/dL (ref 30.0–36.0)
MCV: 85.1 fL (ref 80.0–100.0)
Platelets: 530 10*3/uL — ABNORMAL HIGH (ref 150–400)
RBC: 3.08 MIL/uL — ABNORMAL LOW (ref 3.87–5.11)
RDW: 15.8 % — ABNORMAL HIGH (ref 11.5–15.5)
WBC: 8.2 10*3/uL (ref 4.0–10.5)
nRBC: 0 % (ref 0.0–0.2)

## 2023-04-24 LAB — MAGNESIUM: Magnesium: 1.8 mg/dL (ref 1.7–2.4)

## 2023-04-24 MED ORDER — SODIUM CHLORIDE 0.9 % IV BOLUS
1000.0000 mL | Freq: Once | INTRAVENOUS | Status: AC
  Administered 2023-04-24: 1000 mL via INTRAVENOUS

## 2023-04-24 MED ORDER — LOPERAMIDE HCL 2 MG PO CAPS
2.0000 mg | ORAL_CAPSULE | Freq: Four times a day (QID) | ORAL | Status: DC
Start: 2023-04-24 — End: 2023-04-25
  Administered 2023-04-24 – 2023-04-25 (×4): 2 mg via ORAL
  Filled 2023-04-24 (×5): qty 1

## 2023-04-24 MED ORDER — HYDROMORPHONE HCL 2 MG PO TABS
2.0000 mg | ORAL_TABLET | ORAL | Status: DC | PRN
  Administered 2023-04-24 – 2023-04-25 (×4): 2 mg via ORAL
  Filled 2023-04-24 (×4): qty 1

## 2023-04-24 NOTE — Progress Notes (Addendum)
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  The patient continues to complain of her usual abdominal pain for which she received Dilaudid 2 mg, #20 on 04/11/2023 at the time of her last discharge. The patient was recently hospitalized from 04/03/2023 to 04/10/2023 for septic shock related to her stoma site infection and prior candidemia.  The patient was discharged home with 5 more doses of doxycycline and cefadroxil.  She was also sent home with Cresemba to finish on 04/14/2023.  GI was also consulted secondary to rectal bleeding.  Flex sig was performed on 04/14/2023 which showed oral rectal mucosa with spontaneous oozing and touch friability.  the patient was discharged home with instructions for nightly steroid enemas for 4 weeks.. Notably, the patient had a prolonged hospitalization from 01/27/2023 to 03/09/2023 for fulminant close treatment of his colitis with perforation and pelvic abscesses requiring colectomy on 02/11/2023.  That hospitalization was prolonged secondary to Candida glabrata fungemia.  Her blood cultures remain negative at the time of discharge.  In the ED, the patient was afebrile with oxygen saturation 100% room air.  Her blood pressures remained soft with SBP in the 80s and lower 90s.  WBC 10.3, hemoglobin 11.7, platelets 462.  Sodium 139, potassium 3.4, bicarbonate 26, serum creatinine 2.44.  Ammonia 11, lactic acid 2.5>> 1.7.  CT abdomen pelvis was negative for any acute findings.  It showed postoperative changes from the total colectomy with right ventral wall abdominal ileostomy.  The  patient was given 2 L normal saline bolus.  She was admitted for further evaluation treatment of her AKI and low blood pressure. Notably, the patient has had history of low blood pressures.  Review of medical record shows that the patient has soft blood pressures during her last hospitalization in the low 90s.   Assessment/Plan: Acute kidney injury -Secondary to volume depletion -Baseline creatinine 0.5-0.7 -Continue IV fluids>>improved -start imodium in hopes to slow down ostomy output   Chronic abdominal pain/opioid dependence -PDMP reviewed -Dilaudid 2 mg, #20 refill 04/11/2023 -Xanax 1 mg, #90, refill 04/21/2023 -Judicious opioids   Hyponatremia -Secondary to volume depletion -continue isotonic saline>>improved   Proctitis -previously prescribed hydrocortisone enemas -Hemoglobin is stable and improved from prior admission -Consult GI as the patient has had history of poor outpatient follow-up -She was supposed to see [facility] 2 weeks from her last discharge -mesalamine enemas started--planned q hs x 4 weeks   Hypokalemia/Hypomagnesemia -replete -add KCl to IVF -start daily magx ox   Fungemia -C. Ellard Artis -on Cresemba at home>>continue until she follow up with Dr. Daiva Eves   Status post colectomy -Patient had colectomy 02/11/2023 secondary to fulminant C. Difficile -Patient has run out of her ostomy supplies once again -Consult ostomy nurse>>appreciate input  High Output ostomy -restart loperamide -give additional fluid bolus   Hypotension -Continue midodrine -Continue IV fluids -Check PCT 0.13 -Blood cultures x 2 sets--neg to date -UA neg for pyuria -due to volume depletion from high output ostomy -follow up cortrosyn stimulation   Lactic acidosis -Continue IV fluids>> improved -UA--no pyuria -blood culture--neg to date -CXR--no infiltrates   History of PE/DVT -Continue apixaban   History of cardiac  arrest -01/2023 -Remain on telemetry    Thrombocytosis -Previously evaluated by hematology--felt to be inflammatory/reactive -Has been present during prior hospitalizations -Studies at that time included BCR-ABL-1, CML/ALL PCR, JAK2 which were all negative    Seizure disorder -Continue Keppra   Anxiety -Continue home dose alprazolam   Poor IV access -cVL placed by Dr. Lovell Sheehan 12/21             Family Communication: no  Family at bedside   Consultants:  none   Code Status:  FULL   DVT Prophylaxis:  apixaban  Dispo--hopefully home 12/23 once we can get patient small quantity of ostomy supplies and HH or [facility] can be set up     Procedures: As Listed in Progress Note Above   Antibiotics: None      Subjective: Pt complains of abd pain.  Denies f/c, cp, sob, n/v/.  Has copious loos stool in ostomy bag.    Objective: Vitals:   04/24/23 0945 04/24/23 1100 04/24/23 1130 04/24/23 1150  BP:  (!) 87/41    Pulse: (!) 110 (!) 111 (!) 109 (!) 111  Resp: 17 17 18 11   Temp:    97.8 F (36.6 C)  TempSrc:    Oral  SpO2: 100% 99% 100% 100%  Weight:      Height:        Intake/Output Summary (Last 24 hours) at 04/24/2023 1310 Last data filed at 04/24/2023 0700 Gross per 24 hour  Intake 2310.34 ml  Output 2600 ml  Net -289.66 ml   Weight change:  Exam:  General:  Pt is alert, follows commands appropriately, not in acute distress HEENT: No icterus, No thrush, No neck mass, [place]/AT Cardiovascular: RRR, S1/S2, no rubs, no gallops Respiratory: CTA bilaterally, no wheezing, no crackles, no rhonchi Abdomen: Soft/+BS, mild diffuse tender, non distended, no guarding Extremities: No edema, No lymphangitis, No petechiae, No rashes, no synovitis   Data Reviewed: I have personally reviewed following labs and imaging studies Basic Metabolic Panel: Recent Labs  Lab 04/22/23 0330 04/23/23 0508 04/24/23 0735  NA 129* 133* 132*  K 3.4* 3.1* 4.7  CL 86* 97* 104  CO2 26 24 21*  GLUCOSE 108* 98 85  BUN 18 11 5*   CREATININE 2.44* 1.06* 0.69  CALCIUM 8.9 7.9* 8.3*  MG  --  1.0* 1.8   Liver Function Tests: Recent Labs  Lab 04/22/23 0330  AST 122*  ALT 78*  ALKPHOS 180*  BILITOT 0.8  PROT 6.8  ALBUMIN 3.0*   No results for input(s): "LIPASE", "AMYLASE" in the last 168 hours. Recent Labs  Lab 04/22/23 0512  AMMONIA 11   Coagulation Profile: No results for input(s): "INR", "PROTIME" in the last 168 hours. CBC: Recent Labs  Lab 04/22/23 0330 04/23/23 0508 04/24/23 0735  WBC 10.3 7.9 8.2  HGB 11.7* 9.0* 8.4*  HCT 37.2 28.2* 26.2*  MCV 83.8 83.9 85.1  PLT 462* 608* 530*   Cardiac Enzymes: No results for input(s): "CKTOTAL", "CKMB", "CKMBINDEX", "TROPONINI" in the last 168 hours. BNP: Invalid input(s): "POCBNP" CBG: No results for input(s): "GLUCAP" in the last 168 hours. HbA1C: No results for input(s): "HGBA1C" in the last 72 hours. Urine analysis:    Component Value Date/Time   COLORURINE YELLOW 04/23/2023 0115   APPEARANCEUR CLEAR 04/23/2023 0115   LABSPEC 1.009 04/23/2023 0115   PHURINE 5.0 04/23/2023 0115   GLUCOSEU NEGATIVE 04/23/2023 0115   GLUCOSEU NEG mg/dL 42/59/5638 7564   HGBUR NEGATIVE 04/23/2023 0115   BILIRUBINUR NEGATIVE  04/23/2023 0115   KETONESUR NEGATIVE 04/23/2023 0115   PROTEINUR NEGATIVE 04/23/2023 0115   UROBILINOGEN 0.2 12/26/2013 1530   NITRITE NEGATIVE 04/23/2023 0115   LEUKOCYTESUR TRACE (A) 04/23/2023 0115   Sepsis Labs: @LABRCNTIP (procalcitonin:4,lacticidven:4) ) Recent Results (from the past 240 hours)  Culture, blood (single) w Reflex to ID Panel     Status: None (Preliminary result)   Collection Time: 04/22/23  9:18 AM   Specimen: BLOOD  Result Value Ref Range Status   Specimen Description BLOOD BOTTLES DRAWN AEROBIC ONLY  Final   Special Requests Blood Culture adequate volume RT HAND  Final   Culture   Final    NO GROWTH 2 DAYS Performed at [facility], [address] [place], [place] [address]    Report Status PENDING   Incomplete  MRSA Next Gen by PCR, Nasal     Status: None   Collection Time: 04/22/23  3:33 PM   Specimen: Nasal Mucosa; Nasal Swab  Result Value Ref Range Status   MRSA by PCR Next Gen NOT DETECTED NOT DETECTED Final    Comment: (NOTE) The GeneXpert MRSA Assay (FDA approved for NASAL specimens only), is one component of a comprehensive MRSA colonization surveillance program. It is not intended to diagnose MRSA infection nor to guide or monitor treatment for MRSA infections. Test performance is not FDA approved in patients less than 56 years old. Performed at [facility], [address] [place], [place] [address]      Scheduled Meds:  apixaban  2.5 mg Oral BID   Chlorhexidine Gluconate Cloth  6 each Topical Daily   cycloSPORINE  1 drop Both Eyes BID   DULoxetine  30 mg Oral Daily   feeding supplement  237 mL Oral BID BM   folic acid  1 mg Oral Daily   Isavuconazonium Sulfate  186 mg Oral Daily   levETIRAcetam  750 mg Oral BID   loratadine  10 mg Oral Daily   magnesium oxide  400 mg Oral BID   mesalamine  4 g Rectal QHS   midodrine  5 mg Oral TID WC   pantoprazole  40 mg Oral BID   sodium chloride flush  10-40 mL Intracatheter Q12H   Continuous Infusions:  0.9 % NaCl with KCl 20 mEq / L Stopped (04/24/23 1221)    Procedures/Studies: DG Chest Port 1 View Result Date: 04/23/2023 CLINICAL DATA:  Central line placement EXAM: PORTABLE CHEST 1 VIEW COMPARISON:  04/03/2023 FINDINGS: Left subclavian CVC tip in the mid SVC. Stable cardiomediastinal silhouette. No focal consolidation, pleural effusion, or pneumothorax. No displaced rib fractures. IMPRESSION: Left subclavian CVC tip in the mid SVC. No pneumothorax. Electronically Signed   By: Minerva Fester M.D.   On: 04/23/2023 11:55   [place] EKG SITE RITE Result Date: 04/23/2023 If Site Rite image not attached, placement could not be confirmed due to current cardiac rhythm.  CT ABDOMEN PELVIS WO CONTRAST Result Date:  04/22/2023 CLINICAL DATA:  Abdominal pain EXAM: CT ABDOMEN AND PELVIS WITHOUT CONTRAST TECHNIQUE: Multidetector CT imaging of the abdomen and pelvis was performed following the standard protocol without IV contrast. RADIATION DOSE REDUCTION: This exam was performed according to the departmental dose-optimization program which includes automated exposure control, adjustment of the mA and/or kV according to patient size and/or use of iterative reconstruction technique. COMPARISON:  04/03/2023 FINDINGS: Lower chest: No pleural effusion or consolidative change. Hepatobiliary: There is diffuse hepatic steatosis with peripheral, wedge-shaped area of focal fatty sparing along segment 7 of the dome  of liver. Cholecystectomy. No significant biliary ductal dilatation. Pancreas: Unremarkable. No pancreatic ductal dilatation or surrounding inflammatory changes. Spleen: Normal in size without focal abnormality. Adrenals/Urinary Tract: Normal adrenal glands. There is no nephrolithiasis, hydronephrosis or suspicious mass. Stomach/Bowel: Stomach filled with ingested debris. Stomach filled with ingested material. There are postsurgical changes from total colectomy with right ventral abdominal wall ileostomy. There is no pathologic dilatation of the small bowel loops. No wall thickening or inflammatory changes. Vascular/Lymphatic: Normal appearance of the abdominal aorta. No signs of abdominopelvic adenopathy. Reproductive: Status post hysterectomy. No adnexal masses. Other: There is no free fluid, fluid collections. No signs of pneumoperitoneum. Residual thickening along the peritoneal reflections with mild persistent soft tissue stranding/edema within the mesenteric fat. No signs of abscess. No pneumoperitoneum identified. Musculoskeletal: No acute or significant osseous findings. IMPRESSION: 1. No acute findings within the abdomen or pelvis. 2. Postsurgical changes from total colectomy with right ventral abdominal wall ileostomy.  No signs of bowel obstruction. 3. Residual thickening along the peritoneal reflections with mild persistent soft tissue stranding/edema within the mesenteric fat. No signs of abscess. 4. Hepatic steatosis. Electronically Signed   By: Signa Kell M.D.   On: 04/22/2023 05:39   CT ABDOMEN PELVIS WO CONTRAST Result Date: 04/03/2023 CLINICAL DATA:  Abdominal pain.  Hypotension. EXAM: CT ABDOMEN AND PELVIS WITHOUT CONTRAST TECHNIQUE: Multidetector CT imaging of the abdomen and pelvis was performed following the standard protocol without IV contrast. RADIATION DOSE REDUCTION: This exam was performed according to the departmental dose-optimization program which includes automated exposure control, adjustment of the mA and/or kV according to patient size and/or use of iterative reconstruction technique. COMPARISON:  CT 02/25/2023. FINDINGS: Lower chest: Improved left pleural effusion compared to previous. Improving left lung opacity. There is some mild opacity residual in the lingula dependently. There is also some opacity along the anterior aspect of the right lower lobe. A focal infiltrates possible. Recommend follow-up. Breathing motion. Heart is slightly enlarged. Trace pericardial fluid. Hepatobiliary: Heterogeneous liver with some fatty infiltration. Previous cholecystectomy. Pancreas: Unremarkable. No pancreatic ductal dilatation or surrounding inflammatory changes. Spleen: Normal in size without focal abnormality. Adrenals/Urinary Tract: Stable slight thickening of the left adrenal gland. Right adrenal gland is preserved. No abnormal calcifications are seen within either kidney nor along the course of either ureter. Preserved contours of the distended urinary bladder there is persistent wall thickening of the bladder particularly superiorly as seen on coronal series 5, image 49 please correlate for any known history or further workup when appropriate Stomach/Bowel: Right lower quadrant ileostomy identified. On  this non oral contrast exam the stomach and small bowel are nondilated. Large bowel is relatively decompressed as well. The previous drain is no longer identified. Areas of stranding along the mesentery on left side are decreasing with significant residual. No free air or free fluid. Vascular/Lymphatic: Normal caliber aorta and IVC. Minimal vascular calcifications no discrete abnormal lymph node enlargement identified in the abdomen and pelvis. Previous small node aortocaval measuring 8 mm in short axis on the prior, today measures 6 mm on series 2 image 34. Reproductive: Status post hysterectomy. No adnexal masses. Other: Please see above Musculoskeletal: Curvature of the spine. Scattered degenerative changes. IMPRESSION: Decreasing mesenteric stranding, fluid with some residual. Previous drain is also no longer present. Right lower quadrant ostomy. No bowel obstruction.  The bowel overall is relatively decompressed. No obstructing renal stone. Improving pleural effusions and lung opacities with some residual opacity in the right lower lobe and lingula. Please correlate for signs  of infiltrative recommend continued follow-up. Electronically Signed   By: Karen Kays M.D.   On: 04/03/2023 13:17   DG Chest Port 1 View Result Date: 04/03/2023 CLINICAL DATA:  Possible sepsis. EXAM: PORTABLE CHEST 1 VIEW COMPARISON:  February 28, 2023. FINDINGS: The heart size and mediastinal contours are within normal limits. Left internal jugular catheter is noted with distal tip in expected position of the SVC. Both lungs are clear. The visualized skeletal structures are unremarkable. IMPRESSION: No active disease. Electronically Signed   By: Lupita Raider M.D.   On: 04/03/2023 12:42    Catarina Hartshorn, DO  [facility]  If 7PM-7AM, please contact night-coverage [email or web address] Password [email or web address] 04/24/2023, 1:10 PM   LOS: 2 days

## 2023-04-24 NOTE — Progress Notes (Signed)
Pt was given her night time meds, walked to commode in room to use ROWASA because she wanted to do it herself. Pt mad because we are not giving her pain medication. I adv the previous RN had just given her a dose just before 2000. She said that oral medication does nothing for her pain. I told her that I could not give her anything IV without a MD order and it wasn't time for the oral med. Before leaving her room I told her that I would be back to check on her and she said "Don't!" I then said in that case if she needed something to please call me and she said "I would rather call 911 if I need something." I have been peaking in room to check on pt and she has been sleeping. Will continue to monitor.

## 2023-04-24 NOTE — TOC Progression Note (Addendum)
Transition of Care [facility]) - Progression Note    Patient Details  Name: [name] MRN: [id number] Date of Birth: 06/27/1970  [facility] CM/SW Contact  Catalina Gravel, [place] Phone Number: 04/24/2023, 12:00 PM  Clinical Narrative:    Pt near DC ready, MD concerned pt requires additional services post discharge and inquired about [facility].  CSW referred to [facility]. Noticed at time of referral that pt had Medicaid for Ins and not Medicare, still asked if the team via email referral, if they are aware of more intense options at DC.  TOC to follow.   Addendum: [facility] responded that they will inquire and follow up Monday.  Also note Pt shared with MD that approved for disability but checks have not started. Pt would like guidance to determine disability process and  status of check. TOC to follow.  Expected Discharge Plan: Home/Self Care Barriers to Discharge: Continued Medical Work up  Expected Discharge Plan and Services In-house Referral: Clinical Social Work     Living arrangements for the past 2 months: Single Family Home                                       Social Determinants of Health (SDOH) Interventions SDOH Screenings   Food Insecurity: Food Insecurity Present (04/22/2023)  Housing: High Risk (04/22/2023)  Transportation Needs: Unmet Transportation Needs (04/22/2023)  Utilities: At Risk (04/22/2023)  Financial Resource Strain: Medium Risk (10/18/2022)   Received from [facility]  Social Connections: Unknown (09/01/2021)   [facility], [facility]  Stress: Stress Concern Present (06/25/2020)   Received from [facility], [facility]  Tobacco Use: High Risk (04/21/2023)    Readmission Risk Interventions    04/04/2023    1:36 PM  Readmission Risk Prevention Plan  Transportation Screening Complete  Medication Review (RN Care Manager) Complete  PCP or Specialist appointment within 3-5 days of discharge Not Complete  HRI or  Home Care Consult Not Complete  SW Recovery Care/Counseling Consult Not Complete  Palliative Care Screening Not Applicable  Skilled Nursing Facility Not Applicable

## 2023-04-24 NOTE — Plan of Care (Signed)

## 2023-04-25 ENCOUNTER — Ambulatory Visit (HOSPITAL_COMMUNITY): Payer: Medicaid Other | Admitting: Nurse Practitioner

## 2023-04-25 ENCOUNTER — Telehealth: Payer: Self-pay

## 2023-04-25 DIAGNOSIS — D75839 Thrombocytosis, unspecified: Secondary | ICD-10-CM | POA: Diagnosis not present

## 2023-04-25 DIAGNOSIS — B49 Unspecified mycosis: Secondary | ICD-10-CM | POA: Diagnosis not present

## 2023-04-25 DIAGNOSIS — N179 Acute kidney failure, unspecified: Secondary | ICD-10-CM | POA: Diagnosis not present

## 2023-04-25 DIAGNOSIS — R6510 Systemic inflammatory response syndrome (SIRS) of non-infectious origin without acute organ dysfunction: Secondary | ICD-10-CM | POA: Diagnosis not present

## 2023-04-25 LAB — CBC
HCT: 24.9 % — ABNORMAL LOW (ref 36.0–46.0)
Hemoglobin: 8 g/dL — ABNORMAL LOW (ref 12.0–15.0)
MCH: 27.8 pg (ref 26.0–34.0)
MCHC: 32.1 g/dL (ref 30.0–36.0)
MCV: 86.5 fL (ref 80.0–100.0)
Platelets: 477 10*3/uL — ABNORMAL HIGH (ref 150–400)
RBC: 2.88 MIL/uL — ABNORMAL LOW (ref 3.87–5.11)
RDW: 15.9 % — ABNORMAL HIGH (ref 11.5–15.5)
WBC: 6.1 10*3/uL (ref 4.0–10.5)
nRBC: 0 % (ref 0.0–0.2)

## 2023-04-25 LAB — ACTH STIMULATION, 3 TIME POINTS
Cortisol, 30 Min: 17.1 ug/dL
Cortisol, 60 Min: 23.1 ug/dL
Cortisol, Base: 7 ug/dL

## 2023-04-25 LAB — BASIC METABOLIC PANEL
Anion gap: 6 (ref 5–15)
BUN: <5 mg/dL — ABNORMAL LOW (ref 6–20)
CO2: 25 mmol/L (ref 22–32)
Calcium: 8.4 mg/dL — ABNORMAL LOW (ref 8.9–10.3)
Chloride: 104 mmol/L (ref 98–111)
Creatinine, Ser: 0.81 mg/dL (ref 0.44–1.00)
GFR, Estimated: >60 mL/min (ref >60)
Glucose, Bld: 94 mg/dL (ref 70–99)
Potassium: 3.7 mmol/L (ref 3.5–5.1)
Sodium: 135 mmol/L (ref 135–145)

## 2023-04-25 LAB — MAGNESIUM: Magnesium: 1.5 mg/dL — ABNORMAL LOW (ref 1.7–2.4)

## 2023-04-25 MED ORDER — DULOXETINE HCL 30 MG PO CPEP: 30.0000 mg | ORAL_CAPSULE | Freq: Every day | ORAL | 0 refills | Status: DC

## 2023-04-25 MED ORDER — PROCHLORPERAZINE MALEATE 5 MG PO TABS: 5.0000 mg | ORAL_TABLET | Freq: Four times a day (QID) | ORAL | 0 refills | Status: DC | PRN

## 2023-04-25 MED ORDER — ONDANSETRON 4 MG PO TBDP: 4.0000 mg | ORAL_TABLET | Freq: Three times a day (TID) | ORAL | 1 refills | Status: DC | PRN

## 2023-04-25 MED ORDER — APIXABAN 2.5 MG PO TABS: 2.5000 mg | ORAL_TABLET | Freq: Two times a day (BID) | ORAL | 0 refills | Status: DC

## 2023-04-25 MED ORDER — HYDROMORPHONE HCL 2 MG PO TABS: 2.0000 mg | ORAL_TABLET | Freq: Four times a day (QID) | ORAL | 0 refills | Status: DC | PRN

## 2023-04-25 MED ORDER — MAGNESIUM SULFATE 4 GM/100ML IV SOLN
4.0000 g | Freq: Once | INTRAVENOUS | Status: AC
  Administered 2023-04-25: 4 g via INTRAVENOUS
  Filled 2023-04-25: qty 100

## 2023-04-25 MED ORDER — LOPERAMIDE HCL 2 MG PO CAPS: 2.0000 mg | ORAL_CAPSULE | Freq: Four times a day (QID) | ORAL | 0 refills | Status: DC

## 2023-04-25 MED ORDER — NITROGLYCERIN 0.4 MG SL SUBL: 0.4000 mg | SUBLINGUAL_TABLET | SUBLINGUAL | 0 refills | Status: AC | PRN

## 2023-04-25 MED ORDER — MAGNESIUM OXIDE -MG SUPPLEMENT 400 (240 MG) MG PO TABS: 400.0000 mg | ORAL_TABLET | Freq: Two times a day (BID) | ORAL | 0 refills | Status: DC

## 2023-04-25 NOTE — Plan of Care (Signed)

## 2023-04-25 NOTE — Telephone Encounter (Signed)
----- Message from Frederik Schmidt [facility] sent at 04/22/2023 12:11 PM EST ----- Regarding: RE: Patient called Thanks. I just viewed these messages. I have already started her on these today as she is currently inpatient.  Thanks for getting it set up outpatient for her. She likely will not pick these up herself so if [facility] could deliver that would likely be best. ----- Message ----- From: Corbin Ade, MD Sent: 04/22/2023  11:36 AM EST To: Catarina Hartshorn, MD; Gatha Mayer, CMA; # Subject: FW: Patient called                             Communication noted regarding ostomy resource.  As far as mesalamine enemas are concerned, please send in a prescription for mesalamine enemas 4 g.  Dispense 30 with 1 refill  ;  one per rectum at bedtime. ----- Message ----- From: Gatha Mayer, CMA Sent: 04/22/2023  10:00 AM EST To: Corbin Ade, MD Subject: RE: Patient called                             According to the pt's chart, [facility] is handling getting her ostomy supplies. Are you wanting me to send in mesalamine to the pharmacy for the pt? If so what is the qty? ----- Message ----- From: Corbin Ade, MD Sent: 04/22/2023   9:44 AM EST To: Catarina Hartshorn, MD; Gatha Mayer, CMA; # Subject: RE: Patient called                             This patient has been very difficult to deal with as an outpatient.  She has had some recent extremely serious complications related to her Crohn's disease.  FS recently demonstrated bloody friable ulcerated rectal stump.  Biopsies severe inflammation/ulcer.  Working diagnosis diversion proctitis.  She was supposed to have gone home with cortisone enemas pending results of path.  She called yesterday and stated she was not given Cort enemas upon discharge.  Based on path, I had recommended she then be transition to butyrate enemas.  This can only be obtained via compounding in [place].  Patient states she could not go to [facility] and had no  way to get this medication.  My compromise would be recommending mesalamine enemas 1 per rectum nightly.  Now she called in last night about lack of colostomy supplies.  And it appears she is in the ED once again.  This lady needs regular home health support.  She would benefit from [facility] nurse specialist as an advocate in the outpatient setting.  Historically, she has fairly poor insight into her health issues and not very engaged in her own care. ----- Message ----- From: Franky Macho, MD Sent: 04/21/2023   9:25 PM EST To: Corbin Ade, MD Subject: Patient called                                 Hi Dr Jena Gauss   This patient called me at this time of the night through operator asking to speak to you .   I see Dr Levon Hedger is supposed to be her primary GI .   In any case looks like she had colostomy and no follow up by surgery etc ,  all she is asking is her supplies for colostomy bag . She reached out to her PCP who said to follow up with GI   Any idea how we can get her ostomy supplies ?

## 2023-04-25 NOTE — Discharge Summary (Signed)
Physician Discharge Summary  [name] ZOX:[id number] DOB: Jun 16, 1970 DOA: 04/21/2023  PCP: [name], PA-C  Admit date: 04/21/2023 Discharge date: 04/25/2023  Admitted From:  HOME  Disposition:  HOME   Recommendations for Outpatient Follow-up:  Follow up with PCP in 1 weeks Follow up with [facility] as scheduled 05/05/23  Please obtain BMP/Mg in 1 week  Discharge Condition: STABLE   CODE STATUS: FULL DIET: soft foods   Brief Hospitalization Summary: Please see all hospital notes, images, labs for full details of the hospitalization. 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  The patient continues to complain of her usual abdominal pain for which she received Dilaudid 2 mg, #20 on 04/11/2023 at the time of her last discharge. The patient was recently hospitalized from 04/03/2023 to 04/10/2023 for septic shock related to her stoma site infection and prior candidemia.  The patient was discharged home with 5 more doses of doxycycline and cefadroxil.  She was also sent home with Cresemba to finish on 04/14/2023.  GI was also consulted secondary to rectal bleeding.  Flex sig was performed on 04/14/2023 which showed oral rectal mucosa with spontaneous oozing and touch friability.  the patient was discharged home with instructions for nightly steroid enemas for 4 weeks.. Notably, the patient had a prolonged hospitalization from 01/27/2023 to 03/09/2023 for fulminant close treatment of his colitis with perforation and pelvic abscesses requiring colectomy on 02/11/2023.  That hospitalization was prolonged secondary to Candida glabrata fungemia.  Her blood cultures remain negative at the time of  discharge.  In the ED, the patient was afebrile with oxygen saturation 100% room air.  Her blood pressures remained soft with SBP in the 80s and lower 90s.  WBC 10.3, hemoglobin 11.7, platelets 462.  Sodium 139, potassium 3.4, bicarbonate 26, serum creatinine 2.44.  Ammonia 11, lactic acid 2.5>> 1.7.  CT abdomen pelvis was negative for any acute findings.  It showed postoperative changes from the total colectomy with right ventral wall abdominal ileostomy.  The patient was given 2 L normal saline bolus.  She was admitted for further evaluation treatment of her AKI and low blood pressure. Notably, the patient has had history of low blood pressures.  Review of medical record shows that the patient has soft blood pressures during her last hospitalization in the low 90s.  Hospital Course by problem list   Acute kidney injury - RESOLVED  -Secondary to volume depletion -Baseline creatinine 0.5-0.7 -Continue IV fluids>>improved -continue imodium to slow down ostomy output   Chronic abdominal pain/opioid dependence -PDMP reviewed -Dilaudid 2 mg, #20 refill 04/11/2023 -Xanax 1 mg, #90, refill 04/21/2023 -Judicious opioids   Hyponatremia -Secondary to volume depletion -continue isotonic saline>>improved   Proctitis -previously prescribed hydrocortisone enemas -Hemoglobin is stable and improved from prior admission -Consult GI as the patient has had history of poor outpatient follow-up -She was supposed to see [facility] 2 weeks from her last discharge -mesalamine enemas started--planned q hs x 4 weeks (RX reportedly already filled by patient)    Hypokalemia/Hypomagnesemia -replete -add KCl to IVF -IV magnesium sulfate given x 1 dose 12/23 -continue oral supplementation   Fungemia -C. Ellard Artis -on Cresemba at home to take thru 05/11/23>>continue until she follow up with Dr. Daiva Eves   Status post colectomy -Patient had colectomy  02/11/2023 secondary to fulminant C. Difficile -Patient  has run out of her ostomy supplies once again -Consult ostomy nurse>>appreciate input -TOC spoke with patient and assured me patient knows how to access all of her ostomy supplies and has the resources to get her supplies    High Output ostomy -continue loperamide  Hypotension - chronic  -Continue midodrine -treated with IV fluids -Check PCT 0.13 -Blood cultures x 2 sets--neg to date -UA neg for pyuria -due to volume depletion from high output ostomy -follow up cortrosyn stimulation>>rise up 23.1 60 mins afer administration which is reassuring of no primary adrenal insufficiency   Lactic acidosis - RESOLVED  -Continue IV fluids>> improved -UA--no pyuria -blood culture--neg to date -CXR--no infiltrates   History of PE/DVT -Continue apixaban   History of cardiac arrest -01/2023 -Remain on telemetry   Thrombocytosis -Previously evaluated by hematology--felt to be inflammatory/reactive -Has been present during prior hospitalizations -Studies at that time included BCR-ABL-1, CML/ALL PCR, JAK2 which were all negative    Seizure disorder -Continue Keppra   Anxiety -Continue home dose alprazolam   Poor IV access -cVL placed by Dr. Lovell Sheehan 12/21 -DC CVL at discharge   Discharge Diagnoses:  Principal Problem:   Acute kidney injury (HCC) Active Problems:   TOBACCO ABUSE   Thrombocytosis   Fungemia   SIRS (systemic inflammatory response syndrome) (HCC)   Discharge Instructions:  Allergies as of 04/25/2023       Reactions   Codeine Shortness Of Breath, Swelling, Rash   Throat swelling   Hydrocodone Shortness Of Breath, Swelling, Rash   Ketorolac Nausea And Vomiting, Swelling, Nausea Only   Pt reports n/v and swelling to throat   Morphine And Codeine Shortness Of Breath, Swelling, Rash   Penicillins Shortness Of Breath, Swelling, Other (See Comments)   Throat swells 02/06/21--TOLERATES CEFTRIAXONE    Nickel Rash   Pregabalin Other (See Comments)   Acetaminophen     Per MD patient states she can't take Tylenol because of liver enzymes   Atarax [hydroxyzine] Itching, Swelling   Mildly swollen throat   Ativan [lorazepam] Other (See Comments)   Migraines.   Oxycodone-acetaminophen Nausea Only, Nausea And Vomiting   Strawberry Extract Swelling   Watermelon Concentrate Swelling   Albuterol Palpitations   Benadryl [diphenhydramine Hcl] Palpitations   Humira [adalimumab] Rash   Latex Rash   Remicade [infliximab] Rash   Blisters and Welts   Tramadol Rash   Rash and itching        Medication List     STOP taking these medications    cyclobenzaprine 10 MG tablet Commonly known as: FLEXERIL   hydrocortisone 100 MG/60ML enema Commonly known as: CORTENEMA   triazolam 0.25 MG tablet Commonly known as: HALCION       TAKE these medications    acetaminophen 325 MG tablet Commonly known as: TYLENOL Take 2 tablets (650 mg total) by mouth every 6 (six) hours as needed for mild pain or headache (or Fever >/= 101).   albuterol 1.25 MG/3ML nebulizer solution Commonly known as: ACCUNEB Take 1 ampule by nebulization every 6 (six) hours as needed for wheezing or shortness of breath.   ALPRAZolam 1 MG tablet Commonly known as: XANAX Take 1 mg by mouth 3 (three) times daily.   amitriptyline 100 MG tablet Commonly known as: ELAVIL Take 100 mg by mouth at bedtime.   apixaban 2.5 MG Tabs tablet Commonly known as: ELIQUIS Take 1 tablet (2.5 mg total) by mouth 2 (two) times daily.   cetirizine  10 MG tablet Commonly known as: ZYRTEC Take 10 mg by mouth daily.   Cresemba 186 MG Caps Generic drug: Isavuconazonium Sulfate Take 1 capsule (186 mg total) by mouth daily.   DULoxetine 30 MG capsule Commonly known as: CYMBALTA Take 1 capsule (30 mg total) by mouth daily.   feeding supplement Liqd Take 237 mLs by mouth 2 (two) times daily between meals.   fentaNYL 25 MCG/HR Commonly known as: DURAGESIC Place 1 patch onto the skin every 3  (three) days.   gabapentin 300 MG capsule Commonly known as: NEURONTIN Take 300 mg by mouth 2 (two) times daily.   HYDROmorphone 2 MG tablet Commonly known as: DILAUDID Take 1 tablet (2 mg total) by mouth every 6 (six) hours as needed for severe pain (pain score 7-10).   hydrOXYzine 50 MG tablet Commonly known as: ATARAX Take 25-50 mg by mouth 3 (three) times daily as needed for anxiety or nausea.   levETIRAcetam 750 MG tablet Commonly known as: KEPPRA Take 750 mg by mouth 2 (two) times daily.   loperamide 2 MG capsule Commonly known as: IMODIUM Take 1 capsule (2 mg total) by mouth every 6 (six) hours.   magnesium oxide 400 (240 Mg) MG tablet Commonly known as: MAG-OX Take 1 tablet (400 mg total) by mouth 2 (two) times daily.   mesalamine 4 g enema Commonly known as: ROWASA Place 60 mLs (4 g total) rectally at bedtime.   metoprolol tartrate 25 MG tablet Commonly known as: LOPRESSOR Take 0.5 tablets (12.5 mg total) by mouth 2 (two) times daily.   midodrine 5 MG tablet Commonly known as: PROAMATINE Take 1 tablet (5 mg total) by mouth 3 (three) times daily with meals.   nitroGLYCERIN 0.4 MG SL tablet Commonly known as: NITROSTAT Place 1 tablet (0.4 mg total) under the tongue every 5 (five) minutes as needed for chest pain.   ondansetron 4 MG disintegrating tablet Commonly known as: ZOFRAN-ODT Take 1 tablet (4 mg total) by mouth every 8 (eight) hours as needed for nausea or vomiting.   pantoprazole 40 MG tablet Commonly known as: PROTONIX Take 1 tablet (40 mg total) by mouth 2 (two) times daily.   polycarbophil 625 MG tablet Commonly known as: FIBERCON Take 2 tablets (1,250 mg total) by mouth 2 (two) times daily for 15 days.   prochlorperazine 5 MG tablet Commonly known as: COMPAZINE Take 1 tablet (5 mg total) by mouth every 6 (six) hours as needed for refractory nausea / vomiting.        Follow-up Information     [facility] Follow up.   Why: Please call  to order ostomy supplies Contact information: [phone]        [name], PA-C. Schedule an appointment as soon as possible for a visit in 1 week(s).   Specialty: Physician Assistant Why: Hospital Follow Up Contact information: [address] [place] [address] [phone]         Dolores Frame, MD. Go on 05/05/2023.   Specialty: Gastroenterology Why: Hospital Follow Up Contact information: [address] [place] [address] [phone]                Allergies  Allergen Reactions   Codeine Shortness Of Breath, Swelling and Rash    Throat swelling   Hydrocodone Shortness Of Breath, Swelling and Rash   Ketorolac Nausea And Vomiting, Swelling and Nausea Only    Pt reports n/v and swelling to throat   Morphine  And Codeine Shortness Of Breath, Swelling and Rash   Penicillins Shortness Of Breath, Swelling and Other (See Comments)    Throat swells 02/06/21--TOLERATES CEFTRIAXONE     Nickel Rash   Pregabalin Other (See Comments)   Acetaminophen     Per MD patient states she can't take Tylenol because of liver enzymes   Atarax [Hydroxyzine] Itching and Swelling    Mildly swollen throat   Ativan [Lorazepam] Other (See Comments)    Migraines.   Oxycodone-Acetaminophen Nausea Only and Nausea And Vomiting   Strawberry Extract Swelling   Watermelon Concentrate Swelling   Albuterol Palpitations   Benadryl [Diphenhydramine Hcl] Palpitations   Humira [Adalimumab] Rash   Latex Rash   Remicade [Infliximab] Rash    Blisters and Welts    Tramadol Rash    Rash and itching   Allergies as of 04/25/2023       Reactions   Codeine Shortness Of Breath, Swelling, Rash   Throat swelling   Hydrocodone Shortness Of Breath, Swelling, Rash   Ketorolac Nausea And Vomiting, Swelling, Nausea Only   Pt reports n/v and swelling to throat   Morphine And Codeine Shortness Of Breath, Swelling, Rash   Penicillins Shortness Of  Breath, Swelling, Other (See Comments)   Throat swells 02/06/21--TOLERATES CEFTRIAXONE    Nickel Rash   Pregabalin Other (See Comments)   Acetaminophen    Per MD patient states she can't take Tylenol because of liver enzymes   Atarax [hydroxyzine] Itching, Swelling   Mildly swollen throat   Ativan [lorazepam] Other (See Comments)   Migraines.   Oxycodone-acetaminophen Nausea Only, Nausea And Vomiting   Strawberry Extract Swelling   Watermelon Concentrate Swelling   Albuterol Palpitations   Benadryl [diphenhydramine Hcl] Palpitations   Humira [adalimumab] Rash   Latex Rash   Remicade [infliximab] Rash   Blisters and Welts   Tramadol Rash   Rash and itching        Medication List     STOP taking these medications    cyclobenzaprine 10 MG tablet Commonly known as: FLEXERIL   hydrocortisone 100 MG/60ML enema Commonly known as: CORTENEMA   triazolam 0.25 MG tablet Commonly known as: HALCION       TAKE these medications    acetaminophen 325 MG tablet Commonly known as: TYLENOL Take 2 tablets (650 mg total) by mouth every 6 (six) hours as needed for mild pain or headache (or Fever >/= 101).   albuterol 1.25 MG/3ML nebulizer solution Commonly known as: ACCUNEB Take 1 ampule by nebulization every 6 (six) hours as needed for wheezing or shortness of breath.   ALPRAZolam 1 MG tablet Commonly known as: XANAX Take 1 mg by mouth 3 (three) times daily.   amitriptyline 100 MG tablet Commonly known as: ELAVIL Take 100 mg by mouth at bedtime.   apixaban 2.5 MG Tabs tablet Commonly known as: ELIQUIS Take 1 tablet (2.5 mg total) by mouth 2 (two) times daily.   cetirizine 10 MG tablet Commonly known as: ZYRTEC Take 10 mg by mouth daily.   Cresemba 186 MG Caps Generic drug: Isavuconazonium Sulfate Take 1 capsule (186 mg total) by mouth daily.   DULoxetine 30 MG capsule Commonly known as: CYMBALTA Take 1 capsule (30 mg total) by mouth daily.   feeding supplement  Liqd Take 237 mLs by mouth 2 (two) times daily between meals.   fentaNYL 25 MCG/HR Commonly known as: DURAGESIC Place 1 patch onto the skin every 3 (three) days.   gabapentin 300 MG capsule Commonly  known as: NEURONTIN Take 300 mg by mouth 2 (two) times daily.   HYDROmorphone 2 MG tablet Commonly known as: DILAUDID Take 1 tablet (2 mg total) by mouth every 6 (six) hours as needed for severe pain (pain score 7-10).   hydrOXYzine 50 MG tablet Commonly known as: ATARAX Take 25-50 mg by mouth 3 (three) times daily as needed for anxiety or nausea.   levETIRAcetam 750 MG tablet Commonly known as: KEPPRA Take 750 mg by mouth 2 (two) times daily.   loperamide 2 MG capsule Commonly known as: IMODIUM Take 1 capsule (2 mg total) by mouth every 6 (six) hours.   magnesium oxide 400 (240 Mg) MG tablet Commonly known as: MAG-OX Take 1 tablet (400 mg total) by mouth 2 (two) times daily.   mesalamine 4 g enema Commonly known as: ROWASA Place 60 mLs (4 g total) rectally at bedtime.   metoprolol tartrate 25 MG tablet Commonly known as: LOPRESSOR Take 0.5 tablets (12.5 mg total) by mouth 2 (two) times daily.   midodrine 5 MG tablet Commonly known as: PROAMATINE Take 1 tablet (5 mg total) by mouth 3 (three) times daily with meals.   nitroGLYCERIN 0.4 MG SL tablet Commonly known as: NITROSTAT Place 1 tablet (0.4 mg total) under the tongue every 5 (five) minutes as needed for chest pain.   ondansetron 4 MG disintegrating tablet Commonly known as: ZOFRAN-ODT Take 1 tablet (4 mg total) by mouth every 8 (eight) hours as needed for nausea or vomiting.   pantoprazole 40 MG tablet Commonly known as: PROTONIX Take 1 tablet (40 mg total) by mouth 2 (two) times daily.   polycarbophil 625 MG tablet Commonly known as: FIBERCON Take 2 tablets (1,250 mg total) by mouth 2 (two) times daily for 15 days.   prochlorperazine 5 MG tablet Commonly known as: COMPAZINE Take 1 tablet (5 mg total) by  mouth every 6 (six) hours as needed for refractory nausea / vomiting.        Procedures/Studies: DG Chest Port 1 View Result Date: 04/23/2023 CLINICAL DATA:  Central line placement EXAM: PORTABLE CHEST 1 VIEW COMPARISON:  04/03/2023 FINDINGS: Left subclavian CVC tip in the mid SVC. Stable cardiomediastinal silhouette. No focal consolidation, pleural effusion, or pneumothorax. No displaced rib fractures. IMPRESSION: Left subclavian CVC tip in the mid SVC. No pneumothorax. Electronically Signed   By: Minerva Fester M.D.   On: 04/23/2023 11:55   [place] EKG SITE RITE Result Date: 04/23/2023 If Site Rite image not attached, placement could not be confirmed due to current cardiac rhythm.  CT ABDOMEN PELVIS WO CONTRAST Result Date: 04/22/2023 CLINICAL DATA:  Abdominal pain EXAM: CT ABDOMEN AND PELVIS WITHOUT CONTRAST TECHNIQUE: Multidetector CT imaging of the abdomen and pelvis was performed following the standard protocol without IV contrast. RADIATION DOSE REDUCTION: This exam was performed according to the departmental dose-optimization program which includes automated exposure control, adjustment of the mA and/or kV according to patient size and/or use of iterative reconstruction technique. COMPARISON:  04/03/2023 FINDINGS: Lower chest: No pleural effusion or consolidative change. Hepatobiliary: There is diffuse hepatic steatosis with peripheral, wedge-shaped area of focal fatty sparing along segment 7 of the dome of liver. Cholecystectomy. No significant biliary ductal dilatation. Pancreas: Unremarkable. No pancreatic ductal dilatation or surrounding inflammatory changes. Spleen: Normal in size without focal abnormality. Adrenals/Urinary Tract: Normal adrenal glands. There is no nephrolithiasis, hydronephrosis or suspicious mass. Stomach/Bowel: Stomach filled with ingested debris. Stomach filled with ingested material. There are postsurgical changes from total colectomy with right  ventral abdominal wall  ileostomy. There is no pathologic dilatation of the small bowel loops. No wall thickening or inflammatory changes. Vascular/Lymphatic: Normal appearance of the abdominal aorta. No signs of abdominopelvic adenopathy. Reproductive: Status post hysterectomy. No adnexal masses. Other: There is no free fluid, fluid collections. No signs of pneumoperitoneum. Residual thickening along the peritoneal reflections with mild persistent soft tissue stranding/edema within the mesenteric fat. No signs of abscess. No pneumoperitoneum identified. Musculoskeletal: No acute or significant osseous findings. IMPRESSION: 1. No acute findings within the abdomen or pelvis. 2. Postsurgical changes from total colectomy with right ventral abdominal wall ileostomy. No signs of bowel obstruction. 3. Residual thickening along the peritoneal reflections with mild persistent soft tissue stranding/edema within the mesenteric fat. No signs of abscess. 4. Hepatic steatosis. Electronically Signed   By: Signa Kell M.D.   On: 04/22/2023 05:39   CT ABDOMEN PELVIS WO CONTRAST Result Date: 04/03/2023 CLINICAL DATA:  Abdominal pain.  Hypotension. EXAM: CT ABDOMEN AND PELVIS WITHOUT CONTRAST TECHNIQUE: Multidetector CT imaging of the abdomen and pelvis was performed following the standard protocol without IV contrast. RADIATION DOSE REDUCTION: This exam was performed according to the departmental dose-optimization program which includes automated exposure control, adjustment of the mA and/or kV according to patient size and/or use of iterative reconstruction technique. COMPARISON:  CT 02/25/2023. FINDINGS: Lower chest: Improved left pleural effusion compared to previous. Improving left lung opacity. There is some mild opacity residual in the lingula dependently. There is also some opacity along the anterior aspect of the right lower lobe. A focal infiltrates possible. Recommend follow-up. Breathing motion. Heart is slightly enlarged. Trace  pericardial fluid. Hepatobiliary: Heterogeneous liver with some fatty infiltration. Previous cholecystectomy. Pancreas: Unremarkable. No pancreatic ductal dilatation or surrounding inflammatory changes. Spleen: Normal in size without focal abnormality. Adrenals/Urinary Tract: Stable slight thickening of the left adrenal gland. Right adrenal gland is preserved. No abnormal calcifications are seen within either kidney nor along the course of either ureter. Preserved contours of the distended urinary bladder there is persistent wall thickening of the bladder particularly superiorly as seen on coronal series 5, image 49 please correlate for any known history or further workup when appropriate Stomach/Bowel: Right lower quadrant ileostomy identified. On this non oral contrast exam the stomach and small bowel are nondilated. Large bowel is relatively decompressed as well. The previous drain is no longer identified. Areas of stranding along the mesentery on left side are decreasing with significant residual. No free air or free fluid. Vascular/Lymphatic: Normal caliber aorta and IVC. Minimal vascular calcifications no discrete abnormal lymph node enlargement identified in the abdomen and pelvis. Previous small node aortocaval measuring 8 mm in short axis on the prior, today measures 6 mm on series 2 image 34. Reproductive: Status post hysterectomy. No adnexal masses. Other: Please see above Musculoskeletal: Curvature of the spine. Scattered degenerative changes. IMPRESSION: Decreasing mesenteric stranding, fluid with some residual. Previous drain is also no longer present. Right lower quadrant ostomy. No bowel obstruction.  The bowel overall is relatively decompressed. No obstructing renal stone. Improving pleural effusions and lung opacities with some residual opacity in the right lower lobe and lingula. Please correlate for signs of infiltrative recommend continued follow-up. Electronically Signed   By: Karen Kays M.D.    On: 04/03/2023 13:17   DG Chest Port 1 View Result Date: 04/03/2023 CLINICAL DATA:  Possible sepsis. EXAM: PORTABLE CHEST 1 VIEW COMPARISON:  February 28, 2023. FINDINGS: The heart size and mediastinal contours are within normal limits. Left internal  jugular catheter is noted with distal tip in expected position of the SVC. Both lungs are clear. The visualized skeletal structures are unremarkable. IMPRESSION: No active disease. Electronically Signed   By: Lupita Raider M.D.   On: 04/03/2023 12:42     Subjective: Pt reports feeling better, reports that she spoke with care management and understands how to get her ostomy supplies.    Discharge Exam: Vitals:   04/25/23 0820 04/25/23 1200  BP:  (!) 110/40  Pulse:  (!) 112  Resp:  18  Temp: 97.6 F (36.4 C)   SpO2:  100%   Vitals:   04/25/23 0600 04/25/23 0800 04/25/23 0820 04/25/23 1200  BP: (!) 111/44 (!) 89/38  (!) 110/40  Pulse: (!) 107 (!) 115  (!) 112  Resp: 13 16  18   Temp:   97.6 F (36.4 C)   TempSrc:   Oral   SpO2: 98% 100%  100%  Weight:      Height:       General: Pt is alert, awake, not in acute distress Cardiovascular: tachycardic, S1/S2 +, no rubs, no gallops Respiratory: CTA bilaterally, no wheezing, no rhonchi Abdominal: Soft, NT, ND, bowel sounds + Extremities: no edema, no cyanosis   The results of significant diagnostics from this hospitalization (including imaging, microbiology, ancillary and laboratory) are listed below for reference.     Microbiology: Recent Results (from the past 240 hours)  Culture, blood (single) w Reflex to ID Panel     Status: None (Preliminary result)   Collection Time: 04/22/23  9:18 AM   Specimen: BLOOD  Result Value Ref Range Status   Specimen Description BLOOD BOTTLES DRAWN AEROBIC ONLY  Final   Special Requests Blood Culture adequate volume RT HAND  Final   Culture   Final    NO GROWTH 3 DAYS Performed at [facility], [address] [place], [place] [address]     Report Status PENDING  Incomplete  MRSA Next Gen by PCR, Nasal     Status: None   Collection Time: 04/22/23  3:33 PM   Specimen: Nasal Mucosa; Nasal Swab  Result Value Ref Range Status   MRSA by PCR Next Gen NOT DETECTED NOT DETECTED Final    Comment: (NOTE) The GeneXpert MRSA Assay (FDA approved for NASAL specimens only), is one component of a comprehensive MRSA colonization surveillance program. It is not intended to diagnose MRSA infection nor to guide or monitor treatment for MRSA infections. Test performance is not FDA approved in patients less than 72 years old. Performed at [facility], [address], [place], [place] [address]      Labs: BNP (last 3 results) Recent Labs    01/27/23 2352  BNP 558.2*   Basic Metabolic Panel: Recent Labs  Lab 04/22/23 0330 04/23/23 0508 04/24/23 0735 04/25/23 0440  NA 129* 133* 132* 135  K 3.4* 3.1* 4.7 3.7  CL 86* 97* 104 104  CO2 26 24 21* 25  GLUCOSE 108* 98 85 94  BUN 18 11 5* <5*  CREATININE 2.44* 1.06* 0.69 0.81  CALCIUM 8.9 7.9* 8.3* 8.4*  MG  --  1.0* 1.8 1.5*   Liver Function Tests: Recent Labs  Lab 04/22/23 0330  AST 122*  ALT 78*  ALKPHOS 180*  BILITOT 0.8  PROT 6.8  ALBUMIN 3.0*   No results for input(s): "LIPASE", "AMYLASE" in the last 168 hours. Recent Labs  Lab 04/22/23 0512  AMMONIA 11   CBC: Recent Labs  Lab 04/22/23 0330  04/23/23 0508 04/24/23 0735 04/25/23 0440  WBC 10.3 7.9 8.2 6.1  HGB 11.7* 9.0* 8.4* 8.0*  HCT 37.2 28.2* 26.2* 24.9*  MCV 83.8 83.9 85.1 86.5  PLT 462* 608* 530* 477*   Cardiac Enzymes: No results for input(s): "CKTOTAL", "CKMB", "CKMBINDEX", "TROPONINI" in the last 168 hours. BNP: Invalid input(s): "POCBNP" CBG: No results for input(s): "GLUCAP" in the last 168 hours. D-Dimer No results for input(s): "DDIMER" in the last 72 hours. Hgb A1c No results for input(s): "HGBA1C" in the last 72 hours. Lipid Profile No results for input(s): "CHOL", "HDL", "LDLCALC",  "TRIG", "CHOLHDL", "LDLDIRECT" in the last 72 hours. Thyroid function studies Recent Labs    04/23/23 0508  TSH 0.902   Anemia work up No results for input(s): "VITAMINB12", "FOLATE", "FERRITIN", "TIBC", "IRON", "RETICCTPCT" in the last 72 hours. Urinalysis    Component Value Date/Time   COLORURINE YELLOW 04/23/2023 0115   APPEARANCEUR CLEAR 04/23/2023 0115   LABSPEC 1.009 04/23/2023 0115   PHURINE 5.0 04/23/2023 0115   GLUCOSEU NEGATIVE 04/23/2023 0115   GLUCOSEU NEG mg/dL 16/02/9603 5409   HGBUR NEGATIVE 04/23/2023 0115   BILIRUBINUR NEGATIVE 04/23/2023 0115   KETONESUR NEGATIVE 04/23/2023 0115   PROTEINUR NEGATIVE 04/23/2023 0115   UROBILINOGEN 0.2 12/26/2013 1530   NITRITE NEGATIVE 04/23/2023 0115   LEUKOCYTESUR TRACE (A) 04/23/2023 0115   Sepsis Labs Recent Labs  Lab 04/22/23 0330 04/23/23 0508 04/24/23 0735 04/25/23 0440  WBC 10.3 7.9 8.2 6.1   Microbiology Recent Results (from the past 240 hours)  Culture, blood (single) w Reflex to ID Panel     Status: None (Preliminary result)   Collection Time: 04/22/23  9:18 AM   Specimen: BLOOD  Result Value Ref Range Status   Specimen Description BLOOD BOTTLES DRAWN AEROBIC ONLY  Final   Special Requests Blood Culture adequate volume RT HAND  Final   Culture   Final    NO GROWTH 3 DAYS Performed at [facility], [address] [place], [place] [address]    Report Status PENDING  Incomplete  MRSA Next Gen by PCR, Nasal     Status: None   Collection Time: 04/22/23  3:33 PM   Specimen: Nasal Mucosa; Nasal Swab  Result Value Ref Range Status   MRSA by PCR Next Gen NOT DETECTED NOT DETECTED Final    Comment: (NOTE) The GeneXpert MRSA Assay (FDA approved for NASAL specimens only), is one component of a comprehensive MRSA colonization surveillance program. It is not intended to diagnose MRSA infection nor to guide or monitor treatment for MRSA infections. Test performance is not FDA approved in patients less than 3  years old. Performed at [facility], [address], [place], [place] [address]    Time coordinating discharge: 44 mins   SIGNED:  Standley Dakins, MD  [facility] 04/25/2023, 1:55 PM How to contact the [facility] Attending or Consulting provider 7A - 7P or covering provider during after hours 7P -7A, for this patient?  Check the care team in [facility] and look for a) attending/consulting TRH provider listed and b) the [facility] team listed Log into [email or web address] and use [place]'s universal password to access. If you do not have the password, please contact the hospital operator. Locate the [facility] provider you are looking for under [facility] and page to a number that you can be directly reached. If you still have difficulty reaching the provider, please page the [facility] (Director on Call) for the Hospitalists listed on amion for assistance.

## 2023-04-25 NOTE — Progress Notes (Signed)
Safe transport arranged for transportation home as patient is unable to arrange a ride home at discharge.

## 2023-04-25 NOTE — TOC Initial Note (Addendum)
Transition of Care [facility]) - Initial/Assessment Note    Patient Details  Name: [name] MRN: [id number] Date of Birth: 05-03-1971  Transition of Care (TOC) CM/SW Contact:    Catalina Gravel, LCSW Phone Number: 04/25/2023, 9:21 AM  Clinical Narrative:                  Pt is high risk for readmission. CSW completed assessment with pt at bedside.  Ptis mostly independent in completing her ADLs. Pt has two friends, a couple who assist with cooking and cleaning the home in exchange for no rental charge,  This couple also drives her to appointments if needed.  Pt states her Dr. Is in [place], because this Dr. Was accepting new pts when she enrolled in insurance.  She admits she has mostly talked to them on the phone, and no recent appointment.  Pt has a front wheel walker at the home, and a potty seat. Pt said that she would like  a wc- because when going to store she tires easily.  CSW asked if she uses the seated carts and she says that she does. We discussed her age and using a wc so soon could cause her to rely more on it and she agreed.  She indicated mostly concerned when she is out. Pt uses [place] for scripts.  She still has a need for ostomy supplies "rings".    CSW also followed up on her mention to MD of disability check not arriving. Pt states that she ahs called her attorney's office and believes that I now in the bank and has arrived.     CSW explained that if she needs any DME to follow up with PCP if she has been D/C. TOC to follow.  Addendum: Called [facility]- they no longer provide ostomy supplies. Consulted with manager- check with [employer]- they accept pt ins, Earna Coder will research and call me back.  Patient has been given order information on previous AP admission-  CSW will provide this same information today  :[email or web address], [phone]. This supplier accepts Medicaid. CM printed out this information and delivered to  patient.  CM also reminded patient about the Ostomy clinic at [facility], they will also help her order supplies. No further TOC needs.   Expected Discharge Plan: Home/Self Care Barriers to Discharge: Continued Medical Work up   Patient Goals and CMS Choice Patient states their goals for this hospitalization and ongoing recovery are:: Return home          Expected Discharge Plan and Services In-house Referral: Clinical Social Work     Living arrangements for the past 2 months: Single Family Home                                      Prior Living Arrangements/Services Living arrangements for the past 2 months: Single Family Home Lives with:: Friends (Daughter lives 7 miles away) Patient language and need for interpreter reviewed:: Yes Do you feel safe going back to the place where you live?: Yes      Need for Family Participation in Patient Care: Yes (Comment) (Friends in home offer support.) Care giver support system in place?: Yes (comment) (two adults live in the home to assist.) Current home services: DME Criminal Activity/Legal Involvement Pertinent to Current Situation/Hospitalization: No - Comment as needed  Activities of Daily Living   ADL Screening (condition at  time of admission) Independently performs ADLs?: Yes (appropriate for developmental age) Is the patient deaf or have difficulty hearing?: No Does the patient have difficulty seeing, even when wearing glasses/contacts?: No Does the patient have difficulty concentrating, remembering, or making decisions?: No  Permission Sought/Granted                  Emotional Assessment Appearance:: Appears older than stated age Attitude/Demeanor/Rapport: Gracious Affect (typically observed): Guarded, Quiet Orientation: : Oriented to Self, Oriented to Situation Alcohol / Substance Use: Other (comment) (denies substance use) Psych Involvement: No (comment)  Admission diagnosis:  Colostomy care (HCC)  [Z43.3] SIRS (systemic inflammatory response syndrome) (HCC) [R65.10] AKI (acute kidney injury) (HCC) [N17.9] Acute kidney injury (HCC) [N17.9] Patient Active Problem List   Diagnosis Date Noted   Acute kidney injury (HCC) 04/22/2023   SIRS (systemic inflammatory response syndrome) (HCC) 04/22/2023   Hypomagnesemia 04/07/2023   Acute post-hemorrhagic anemia 04/07/2023   Septic shock (HCC) 04/03/2023   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   Cardiac arrest (HCC) 01/27/2023   Acute respiratory failure with hypoxia (HCC) 01/27/2023   Upper GI bleed 01/27/2023   GI bleed 01/15/2022   Rectal bleeding 01/12/2022   Acute deep vein thrombosis (DVT) of brachial vein of right upper extremity (HCC)    Enteritis due to Clostridium difficile 12/24/2021   Proctocolitis 12/23/2021   UTI (urinary tract infection) 12/23/2021   Thrombocytosis 10/02/2021   Gait instability 10/02/2021   Hypokalemia 10/01/2021   Skin test reaction, systemic 09/07/2021   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   N&V (nausea and vomiting) 04/30/2021   Abdominal pain    Bloating    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HCC)    Hematochezia 02/04/2021   Crohn's disease of large intestine with other complication (HCC) 02/03/2021   Diarrhea    Metabolic encephalopathy 01/20/2021   Moderate Pericardial effusion 01/19/2021   Right foot pain    AKI (acute kidney injury) (HCC) 01/16/2021   Rhabdomyolysis 01/16/2021   Lactic acidosis  01/16/2021   COVID-19 virus infection 01/16/2021   Shock (HCC) 01/16/2021   Seizure (HCC) 06/24/2020   MDD (major depressive disorder) 03/24/2020   Chest pain 02/07/2020   Chronic patellofemoral pain of both knees 01/11/2020   Essential hypertension 06/17/2018   Insomnia 12/13/2017   Neuropathy 12/13/2017   Chest pain at rest 05/09/2016   COPD (chronic obstructive pulmonary disease) (HCC)    Crohn's disease with complication (HCC)    Diabetes mellitus without complication (HCC)    Anxiety    Cigarette smoker    PTSD (post-traumatic stress disorder)    Palpitation    Constipation 09/20/2012   Head injury 05/09/2012   Hyperlipidemia 04/11/2012   DJD (degenerative joint disease), lumbar 04/04/2012   Paroxysmal SVT (supraventricular tachycardia) (HCC) 04/04/2012   Tobacco dependence 04/04/2012   DOE (dyspnea on exertion) 01/26/2012   Paresthesia 11/25/2011   Abdominal pain, chronic, epigastric 11/01/2011   Abdominal pain, acute, periumbilical 11/01/2011   GERD (gastroesophageal reflux disease)    Hyperglycemia    Chronic low  back pain    IBS (irritable bowel syndrome)    Closed head injury    TOBACCO ABUSE 07/13/2010   Palpitations 07/13/2010   Pleuritic chest pain 07/13/2010   Anxiety state 07/03/2008   BACK PAIN, LUMBAR, CHRONIC 07/03/2008   PCP:  [name], PA-C Pharmacy:   [facility] [place], [place] - [address] [place] [address] Phone: [phone] Fax: [phone]  [facility] [address] [place] - [address] [facility] [address] [place] [address] Phone: [phone] Fax: [phone]  [facility] [address] [place] - [address] [place] [address] [place] [address]  [place] [address] Phone: [phone] Fax: [phone]  [name] [address] [place] [address] Phone: [phone] Fax: [phone]  [facility] [address] [place] [address] Phone: [phone] Fax: [phone]     Social Drivers of Health (SDOH) Social History: SDOH Screenings   Food Insecurity: Food Insecurity Present (04/22/2023)  Housing: High Risk (04/22/2023)  Transportation Needs: Unmet Transportation Needs (04/22/2023)  Utilities: At Risk (04/22/2023)  Financial Resource Strain: Medium Risk (10/18/2022)   Received from [facility]  Social Connections: Unknown (09/01/2021)   [facility]  Stress: Stress Concern Present (06/25/2020)   [facility] from [facility]  Tobacco Use: High Risk (04/21/2023)   SDOH Interventions:     Readmission Risk Interventions    04/25/2023    9:13 AM 04/04/2023    1:36 PM  Readmission Risk Prevention Plan  Transportation Screening Complete Complete  Medication Review (RN Care Manager) Complete Complete  PCP or Specialist appointment within 3-5 days of discharge Complete Not Complete  HRI or Home Care Consult Complete Not Complete  SW Recovery Care/Counseling Consult Complete Not Complete  Palliative Care Screening Not Applicable Not Applicable  Skilled Nursing Facility Not Applicable Not Applicable

## 2023-04-25 NOTE — Progress Notes (Signed)
Home medications returned to patient from pharmacy. Central line removed and pressure dressing applied, patient tolerated well, no bleeding noted. Pt verbalized understanding of all discharge instructions.

## 2023-04-27 LAB — CULTURE, BLOOD (SINGLE)
Culture: NO GROWTH
Special Requests: ADEQUATE

## 2023-05-05 ENCOUNTER — Ambulatory Visit (INDEPENDENT_AMBULATORY_CARE_PROVIDER_SITE_OTHER): Payer: Medicaid Other | Admitting: Gastroenterology

## 2023-05-10 ENCOUNTER — Encounter (HOSPITAL_COMMUNITY): Payer: Self-pay | Admitting: Internal Medicine

## 2023-05-10 ENCOUNTER — Other Ambulatory Visit: Payer: Self-pay

## 2023-05-10 ENCOUNTER — Inpatient Hospital Stay (HOSPITAL_COMMUNITY)
Admission: EM | Admit: 2023-05-10 | Discharge: 2023-05-12 | DRG: 683 | Disposition: A | Discharge status: Home / Self Care | Payer: Medicaid Other | Attending: Internal Medicine | Admitting: Internal Medicine

## 2023-05-10 DIAGNOSIS — I10 Essential (primary) hypertension: Secondary | ICD-10-CM | POA: Diagnosis present

## 2023-05-10 DIAGNOSIS — G894 Chronic pain syndrome: Secondary | ICD-10-CM | POA: Diagnosis present

## 2023-05-10 DIAGNOSIS — E871 Hypo-osmolality and hyponatremia: Secondary | ICD-10-CM

## 2023-05-10 DIAGNOSIS — E876 Hypokalemia: Secondary | ICD-10-CM | POA: Diagnosis present

## 2023-05-10 DIAGNOSIS — K5090 Crohn's disease, unspecified, without complications: Secondary | ICD-10-CM | POA: Diagnosis present

## 2023-05-10 DIAGNOSIS — Z9049 Acquired absence of other specified parts of digestive tract: Secondary | ICD-10-CM

## 2023-05-10 DIAGNOSIS — N179 Acute kidney failure, unspecified: Principal | ICD-10-CM

## 2023-05-10 DIAGNOSIS — F32A Depression, unspecified: Secondary | ICD-10-CM | POA: Diagnosis present

## 2023-05-10 DIAGNOSIS — Z91040 Latex allergy status: Secondary | ICD-10-CM

## 2023-05-10 DIAGNOSIS — F419 Anxiety disorder, unspecified: Secondary | ICD-10-CM | POA: Diagnosis present

## 2023-05-10 DIAGNOSIS — I4719 Other supraventricular tachycardia: Secondary | ICD-10-CM | POA: Diagnosis present

## 2023-05-10 DIAGNOSIS — K219 Gastro-esophageal reflux disease without esophagitis: Secondary | ICD-10-CM | POA: Diagnosis present

## 2023-05-10 DIAGNOSIS — D72829 Elevated white blood cell count, unspecified: Secondary | ICD-10-CM | POA: Diagnosis not present

## 2023-05-10 DIAGNOSIS — Z91018 Allergy to other foods: Secondary | ICD-10-CM

## 2023-05-10 DIAGNOSIS — Z888 Allergy status to other drugs, medicaments and biological substances status: Secondary | ICD-10-CM

## 2023-05-10 DIAGNOSIS — Z79899 Other long term (current) drug therapy: Secondary | ICD-10-CM

## 2023-05-10 DIAGNOSIS — E1140 Type 2 diabetes mellitus with diabetic neuropathy, unspecified: Secondary | ICD-10-CM | POA: Diagnosis present

## 2023-05-10 DIAGNOSIS — Z933 Colostomy status: Secondary | ICD-10-CM

## 2023-05-10 DIAGNOSIS — J4489 Other specified chronic obstructive pulmonary disease: Secondary | ICD-10-CM | POA: Diagnosis present

## 2023-05-10 DIAGNOSIS — Z833 Family history of diabetes mellitus: Secondary | ICD-10-CM

## 2023-05-10 DIAGNOSIS — Z8249 Family history of ischemic heart disease and other diseases of the circulatory system: Secondary | ICD-10-CM

## 2023-05-10 DIAGNOSIS — F17210 Nicotine dependence, cigarettes, uncomplicated: Secondary | ICD-10-CM | POA: Diagnosis present

## 2023-05-10 DIAGNOSIS — Z7901 Long term (current) use of anticoagulants: Secondary | ICD-10-CM

## 2023-05-10 DIAGNOSIS — B369 Superficial mycosis, unspecified: Secondary | ICD-10-CM | POA: Diagnosis not present

## 2023-05-10 DIAGNOSIS — Z880 Allergy status to penicillin: Secondary | ICD-10-CM

## 2023-05-10 DIAGNOSIS — F4310 Post-traumatic stress disorder, unspecified: Secondary | ICD-10-CM | POA: Diagnosis present

## 2023-05-10 DIAGNOSIS — Z885 Allergy status to narcotic agent status: Secondary | ICD-10-CM

## 2023-05-10 DIAGNOSIS — I951 Orthostatic hypotension: Secondary | ICD-10-CM | POA: Diagnosis present

## 2023-05-10 DIAGNOSIS — G40909 Epilepsy, unspecified, not intractable, without status epilepticus: Secondary | ICD-10-CM | POA: Diagnosis present

## 2023-05-10 DIAGNOSIS — E860 Dehydration: Secondary | ICD-10-CM | POA: Diagnosis present

## 2023-05-10 LAB — BASIC METABOLIC PANEL
Anion gap: 16 — ABNORMAL HIGH (ref 5–15)
BUN: 38 mg/dL — ABNORMAL HIGH (ref 6–20)
CO2: 29 mmol/L (ref 22–32)
Calcium: 9.5 mg/dL (ref 8.9–10.3)
Chloride: 83 mmol/L — ABNORMAL LOW (ref 98–111)
Creatinine, Ser: 2.77 mg/dL — ABNORMAL HIGH (ref 0.44–1.00)
GFR, Estimated: 20 mL/min — ABNORMAL LOW (ref >60)
Glucose, Bld: 111 mg/dL — ABNORMAL HIGH (ref 70–99)
Potassium: 3.6 mmol/L (ref 3.5–5.1)
Sodium: 128 mmol/L — ABNORMAL LOW (ref 135–145)

## 2023-05-10 LAB — CBC WITH DIFFERENTIAL/PLATELET
Abs Immature Granulocytes: 0.12 10*3/uL — ABNORMAL HIGH (ref 0.00–0.07)
Basophils Absolute: 0.1 10*3/uL (ref 0.0–0.1)
Basophils Relative: 0 %
Eosinophils Absolute: 0.3 10*3/uL (ref 0.0–0.5)
Eosinophils Relative: 2 %
HCT: 34.5 % — ABNORMAL LOW (ref 36.0–46.0)
Hemoglobin: 11.6 g/dL — ABNORMAL LOW (ref 12.0–15.0)
Immature Granulocytes: 1 %
Lymphocytes Relative: 24 %
Lymphs Abs: 3.5 10*3/uL (ref 0.7–4.0)
MCH: 27.8 pg (ref 26.0–34.0)
MCHC: 33.6 g/dL (ref 30.0–36.0)
MCV: 82.7 fL (ref 80.0–100.0)
Monocytes Absolute: 0.6 10*3/uL (ref 0.1–1.0)
Monocytes Relative: 4 %
Neutro Abs: 9.8 10*3/uL — ABNORMAL HIGH (ref 1.7–7.7)
Neutrophils Relative %: 69 %
Platelets: 812 10*3/uL — ABNORMAL HIGH (ref 150–400)
RBC: 4.17 MIL/uL (ref 3.87–5.11)
RDW: 16 % — ABNORMAL HIGH (ref 11.5–15.5)
WBC: 14.4 10*3/uL — ABNORMAL HIGH (ref 4.0–10.5)
nRBC: 0 % (ref 0.0–0.2)

## 2023-05-10 LAB — URINALYSIS, ROUTINE W REFLEX MICROSCOPIC
Bilirubin Urine: NEGATIVE
Glucose, UA: NEGATIVE mg/dL
Ketones, ur: NEGATIVE mg/dL
Nitrite: NEGATIVE
Protein, ur: NEGATIVE mg/dL
Specific Gravity, Urine: 1.01 (ref 1.005–1.030)
pH: 5 (ref 5.0–8.0)

## 2023-05-10 LAB — PHOSPHORUS: Phosphorus: 3.8 mg/dL (ref 2.5–4.6)

## 2023-05-10 LAB — MAGNESIUM: Magnesium: 1.4 mg/dL — ABNORMAL LOW (ref 1.7–2.4)

## 2023-05-10 MED ORDER — SODIUM CHLORIDE 0.9 % IV SOLN: INTRAVENOUS | Status: DC

## 2023-05-10 MED ORDER — DULOXETINE HCL 30 MG PO CPEP
30.0000 mg | ORAL_CAPSULE | Freq: Every day | ORAL | Status: DC
  Administered 2023-05-10 – 2023-05-11 (×2): 30 mg via ORAL
  Filled 2023-05-10 (×2): qty 1

## 2023-05-10 MED ORDER — ALBUTEROL SULFATE (2.5 MG/3ML) 0.083% IN NEBU: 2.5000 mg | INHALATION_SOLUTION | Freq: Four times a day (QID) | RESPIRATORY_TRACT | Status: DC | PRN

## 2023-05-10 MED ORDER — HYDROMORPHONE HCL 2 MG PO TABS
2.0000 mg | ORAL_TABLET | Freq: Four times a day (QID) | ORAL | Status: DC | PRN
  Administered 2023-05-10 – 2023-05-12 (×6): 2 mg via ORAL
  Filled 2023-05-10 (×6): qty 1

## 2023-05-10 MED ORDER — GABAPENTIN 300 MG PO CAPS
300.0000 mg | ORAL_CAPSULE | Freq: Two times a day (BID) | ORAL | Status: DC
  Administered 2023-05-10 – 2023-05-12 (×4): 300 mg via ORAL
  Filled 2023-05-10 (×4): qty 1

## 2023-05-10 MED ORDER — ACETAMINOPHEN 650 MG RE SUPP: 650.0000 mg | Freq: Four times a day (QID) | RECTAL | Status: DC | PRN

## 2023-05-10 MED ORDER — LORATADINE 10 MG PO TABS
10.0000 mg | ORAL_TABLET | Freq: Every day | ORAL | Status: DC
  Administered 2023-05-10 – 2023-05-11 (×2): 10 mg via ORAL
  Filled 2023-05-10 (×2): qty 1

## 2023-05-10 MED ORDER — AMITRIPTYLINE HCL 25 MG PO TABS
100.0000 mg | ORAL_TABLET | Freq: Every day | ORAL | Status: DC
  Administered 2023-05-10 – 2023-05-11 (×2): 100 mg via ORAL
  Filled 2023-05-10 (×2): qty 4

## 2023-05-10 MED ORDER — PANTOPRAZOLE SODIUM 40 MG PO TBEC
40.0000 mg | DELAYED_RELEASE_TABLET | Freq: Two times a day (BID) | ORAL | Status: DC
Start: 2023-05-10 — End: 2023-05-12
  Administered 2023-05-10 – 2023-05-12 (×4): 40 mg via ORAL
  Filled 2023-05-10 (×4): qty 1

## 2023-05-10 MED ORDER — HYDROXYZINE HCL 25 MG PO TABS
25.0000 mg | ORAL_TABLET | Freq: Three times a day (TID) | ORAL | Status: DC | PRN
  Administered 2023-05-11: 25 mg via ORAL
  Filled 2023-05-10: qty 1

## 2023-05-10 MED ORDER — MIDODRINE HCL 5 MG PO TABS
5.0000 mg | ORAL_TABLET | Freq: Three times a day (TID) | ORAL | Status: DC
  Administered 2023-05-11 – 2023-05-12 (×4): 5 mg via ORAL
  Filled 2023-05-10 (×4): qty 1

## 2023-05-10 MED ORDER — SODIUM CHLORIDE 0.9 % IV SOLN: INTRAVENOUS | Status: AC

## 2023-05-10 MED ORDER — HEPARIN SODIUM (PORCINE) 5000 UNIT/ML IJ SOLN: 5000.0000 [IU] | Freq: Three times a day (TID) | INTRAMUSCULAR | Status: DC

## 2023-05-10 MED ORDER — ALPRAZOLAM 0.5 MG PO TABS
0.7500 mg | ORAL_TABLET | Freq: Three times a day (TID) | ORAL | Status: DC
  Administered 2023-05-10 – 2023-05-12 (×5): 0.75 mg via ORAL
  Filled 2023-05-10 (×5): qty 1

## 2023-05-10 MED ORDER — ACETAMINOPHEN 325 MG PO TABS: 650.0000 mg | ORAL_TABLET | Freq: Four times a day (QID) | ORAL | Status: DC | PRN

## 2023-05-10 MED ORDER — MAGNESIUM SULFATE 2 GM/50ML IV SOLN
2.0000 g | Freq: Once | INTRAVENOUS | Status: AC
  Administered 2023-05-10: 2 g via INTRAVENOUS
  Filled 2023-05-10: qty 50

## 2023-05-10 MED ORDER — ENSURE ENLIVE PO LIQD
237.0000 mL | Freq: Two times a day (BID) | ORAL | Status: DC
  Administered 2023-05-11 – 2023-05-12 (×3): 237 mL via ORAL

## 2023-05-10 MED ORDER — ONDANSETRON HCL 4 MG PO TABS: 4.0000 mg | ORAL_TABLET | Freq: Four times a day (QID) | ORAL | Status: DC | PRN

## 2023-05-10 MED ORDER — ONDANSETRON HCL 4 MG/2ML IJ SOLN: 4.0000 mg | Freq: Four times a day (QID) | INTRAMUSCULAR | Status: DC | PRN

## 2023-05-10 MED ORDER — LOPERAMIDE HCL 2 MG PO CAPS
2.0000 mg | ORAL_CAPSULE | Freq: Three times a day (TID) | ORAL | Status: DC
  Administered 2023-05-10 – 2023-05-12 (×5): 2 mg via ORAL
  Filled 2023-05-10 (×6): qty 1

## 2023-05-10 MED ORDER — APIXABAN 2.5 MG PO TABS
2.5000 mg | ORAL_TABLET | Freq: Two times a day (BID) | ORAL | Status: DC
  Administered 2023-05-10 – 2023-05-12 (×4): 2.5 mg via ORAL
  Filled 2023-05-10 (×4): qty 1

## 2023-05-10 MED ORDER — NICOTINE 14 MG/24HR TD PT24
14.0000 mg | MEDICATED_PATCH | Freq: Every day | TRANSDERMAL | Status: DC
  Filled 2023-05-10 (×3): qty 1

## 2023-05-10 MED ORDER — NYSTATIN 100000 UNIT/GM EX POWD
Freq: Three times a day (TID) | CUTANEOUS | Status: DC
  Filled 2023-05-10: qty 15

## 2023-05-10 MED ORDER — LEVETIRACETAM 500 MG PO TABS
750.0000 mg | ORAL_TABLET | Freq: Two times a day (BID) | ORAL | Status: DC
  Administered 2023-05-10 – 2023-05-12 (×4): 750 mg via ORAL
  Filled 2023-05-10 (×4): qty 1

## 2023-05-10 MED ORDER — METOPROLOL TARTRATE 25 MG PO TABS
12.5000 mg | ORAL_TABLET | Freq: Two times a day (BID) | ORAL | Status: DC
  Administered 2023-05-10 – 2023-05-12 (×4): 12.5 mg via ORAL
  Filled 2023-05-10 (×4): qty 1

## 2023-05-10 NOTE — Progress Notes (Signed)
 Notified Dr. Gwenlyn Perking d/t Yellow MEWS when patient arrived to the floor. Patient in no distress. Yellow MEWS d/t increased HR. Changed patients colostomy bag upon arrival d/t leakage of stool, tolerated well. Will continue to monitor.

## 2023-05-10 NOTE — ED Notes (Signed)
 Nurse called materials for a colostomy bag replacement.

## 2023-05-10 NOTE — Plan of Care (Signed)
  Problem: Education: Goal: Knowledge of General Education information will improve Description: Including pain rating scale, medication(s)/side effects and non-pharmacologic comfort measures 05/10/2023 2132 by Zulma Jon RAMAN, RN Outcome: Progressing 05/10/2023 2132 by Zulma Jon RAMAN, RN Outcome: Progressing   Problem: Health Behavior/Discharge Planning: Goal: Ability to manage health-related needs will improve 05/10/2023 2132 by Zulma Jon RAMAN, RN Outcome: Progressing 05/10/2023 2132 by Zulma Jon RAMAN, RN Outcome: Progressing   Problem: Clinical Measurements: Goal: Ability to maintain clinical measurements within normal limits will improve 05/10/2023 2132 by Zulma Jon RAMAN, RN Outcome: Progressing 05/10/2023 2132 by Zulma Jon RAMAN, RN Outcome: Progressing Goal: Will remain free from infection 05/10/2023 2132 by Zulma Jon RAMAN, RN Outcome: Progressing 05/10/2023 2132 by Zulma Jon RAMAN, RN Outcome: Progressing Goal: Diagnostic test results will improve 05/10/2023 2132 by Zulma Jon RAMAN, RN Outcome: Progressing 05/10/2023 2132 by Zulma Jon RAMAN, RN Outcome: Progressing Goal: Respiratory complications will improve 05/10/2023 2132 by Zulma Jon RAMAN, RN Outcome: Progressing 05/10/2023 2132 by Zulma Jon RAMAN, RN Outcome: Progressing Goal: Cardiovascular complication will be avoided 05/10/2023 2132 by Zulma Jon RAMAN, RN Outcome: Progressing 05/10/2023 2132 by Zulma Jon RAMAN, RN Outcome: Progressing   Problem: Activity: Goal: Risk for activity intolerance will decrease 05/10/2023 2132 by Zulma Jon RAMAN, RN Outcome: Progressing 05/10/2023 2132 by Zulma Jon RAMAN, RN Outcome: Progressing   Problem: Nutrition: Goal: Adequate nutrition will be maintained 05/10/2023 2132 by Zulma Jon RAMAN, RN Outcome: Progressing 05/10/2023 2132 by Zulma Jon RAMAN, RN Outcome: Progressing   Problem: Coping: Goal: Level of anxiety will decrease 05/10/2023 2132 by Zulma Jon RAMAN, RN Outcome: Progressing 05/10/2023 2132 by Zulma Jon RAMAN, RN Outcome: Progressing   Problem: Elimination: Goal: Will not experience complications related to bowel motility 05/10/2023 2132 by Zulma Jon RAMAN, RN Outcome: Progressing 05/10/2023 2132 by Zulma Jon RAMAN, RN Outcome: Progressing Goal: Will not experience complications related to urinary retention 05/10/2023 2132 by Zulma Jon RAMAN, RN Outcome: Progressing 05/10/2023 2132 by Zulma Jon RAMAN, RN Outcome: Progressing   Problem: Pain Management: Goal: General experience of comfort will improve 05/10/2023 2132 by Zulma Jon RAMAN, RN Outcome: Progressing 05/10/2023 2132 by Zulma Jon RAMAN, RN Outcome: Progressing   Problem: Safety: Goal: Ability to remain free from injury will improve 05/10/2023 2132 by Zulma Jon RAMAN, RN Outcome: Progressing 05/10/2023 2132 by Zulma Jon RAMAN, RN Outcome: Progressing   Problem: Skin Integrity: Goal: Risk for impaired skin integrity will decrease 05/10/2023 2132 by Zulma Jon RAMAN, RN Outcome: Progressing 05/10/2023 2132 by Zulma Jon RAMAN, RN Outcome: Progressing

## 2023-05-10 NOTE — H&P (Signed)
 History and Physical    Patient: [name] FMW:[id number] DOB: 02/04/1971 DOA: 05/10/2023 DOS: the patient was seen and examined on 05/10/2023 PCP: Job Browning, PA-C  Patient coming from: Home  Chief Complaint:  Chief Complaint  Patient presents with   colostomy leak   HPI: [name] is a 53 y.o. 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 Patient reports some decreasing oral intake as everything that she eat or drink goes through her.  Workup in the ED demonstrating erythematous changes/fungal dermatitis acute renal failure and hyponatremia.  Fluid resuscitation has been initiated and TRH consulted to place patient in the hospital for further evaluation and management.  Review of Systems: As mentioned in the history of present illness. All other systems reviewed and are negative. 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   GERD (gastroesophageal reflux disease)    EGD 01/2007 by Dr.Rourke small hiatal hernia s/p 56 french maloney    History of head injury    Hypertension    IBS (irritable bowel syndrome)    PSVT (paroxysmal supraventricular tachycardia) (HCC)    PTSD (post-traumatic stress disorder)    Recurrent chest pain    Seizure disorder (HCC)    Type 2 diabetes mellitus (HCC)    Past Surgical History:  Procedure Laterality Date   ABDOMINAL HYSTERECTOMY     with right salpingo oophorectomy 2005    APPENDECTOMY  2006   BALLOON DILATION N/A 02/19/2021   Procedure: BALLOON DILATION;  Surgeon: Cindie Carlin POUR, DO;  Location: [facility];  Service: Endoscopy;  Laterality: N/A;  Sigmoid colon stricture   BIOPSY  02/06/2021   Procedure: BIOPSY;  Surgeon: Cindie Carlin POUR, DO;  Location: [facility];  Service: Endoscopy;;   BIOPSY  02/19/2021   Procedure: BIOPSY;  Surgeon: Cindie Carlin POUR, DO;  [facility]: [facility];  Service: Endoscopy;;   BIOPSY  07/15/2021   Procedure: BIOPSY;  Surgeon: Golda Claudis PENNER, MD;  [facility]: [facility];  Service: Endoscopy;;   BIOPSY  04/08/2023   Procedure: BIOPSY;  Surgeon: Shaaron Lamar HERO, MD;  Location: [facility];  Service: Endoscopy;;   CESAREAN SECTION     1996   CHOLECYSTECTOMY     COLECTOMY N/A 02/11/2023   Procedure: TOTAL COLECTOMY;  Surgeon: Vanderbilt Ned, MD;  Location: [facility];  Service: General;  Laterality: N/A;   COLONOSCOPY  2010   Dr. Shaaron; negative except for hemorrhoids   COLOSTOMY  02/11/2023   Procedure: COLOSTOMY;  Surgeon: Vanderbilt Ned, MD;  Location: [facility];  Service: General;;   ESOPHAGEAL DILATION N/A 10/25/2014   Procedure: ESOPHAGEAL DILATION;  Surgeon: Claudis PENNER Golda, MD;  [facility]: [facility];  Service: Endoscopy;  Laterality: N/A;   ESOPHAGOGASTRODUODENOSCOPY N/A 10/25/2014   Procedure: ESOPHAGOGASTRODUODENOSCOPY (EGD);  Surgeon: Claudis PENNER Golda, MD;  [facility]: [facility];  Service: Endoscopy;  Laterality: N/A;  1250   FLEXIBLE SIGMOIDOSCOPY N/A 02/06/2021   Procedure: FLEXIBLE SIGMOIDOSCOPY;  Surgeon: Cindie Carlin  K, DO;  [facility]: [facility];  Service: Endoscopy;  Laterality: N/A;   FLEXIBLE SIGMOIDOSCOPY N/A 02/19/2021   Procedure: FLEXIBLE SIGMOIDOSCOPY;  Surgeon: Cindie Carlin POUR, DO;  [facility];  Service: Endoscopy;  Laterality: N/A;   FLEXIBLE SIGMOIDOSCOPY N/A 02/02/2023   Procedure: FLEXIBLE SIGMOIDOSCOPY;  Surgeon: Albertus Gordy HERO, MD;  Location: [facility] ENDOSCOPY;  Service:  Gastroenterology;  Laterality: N/A;   FLEXIBLE SIGMOIDOSCOPY N/A 02/10/2023   Procedure: FLEXIBLE SIGMOIDOSCOPY;  Surgeon: Legrand Victory LITTIE DOUGLAS, MD;  Location: [facility] ENDOSCOPY;  Service: Gastroenterology;  Laterality: N/A;   FLEXIBLE SIGMOIDOSCOPY N/A 04/08/2023   Procedure: FLEXIBLE SIGMOIDOSCOPY;  Surgeon: Shaaron Lamar HERO, MD;  [facility];  Service: Endoscopy;  Laterality: N/A;   IR FLUORO GUIDE CV LINE LEFT  02/21/2023   IR US  GUIDE VASC ACCESS LEFT  02/21/2023   LAPAROTOMY N/A 02/11/2023   Procedure: EXPLORATORY LAPAROTOMY;  Surgeon: Vanderbilt Ned, MD;  Location: [facility];  Service: General;  Laterality: N/A;   OOPHORECTOMY     left for torsion and ovarian fibroma; uterine myoma resected 1995   SIGMOIDOSCOPY  07/15/2021   Procedure: SIGMOIDOSCOPY;  Surgeon: Golda Claudis PENNER, MD;  [facility]: [facility];  Service: Endoscopy;;   TRANSESOPHAGEAL ECHOCARDIOGRAM (CATH LAB) N/A 02/18/2023   Procedure: TRANSESOPHAGEAL ECHOCARDIOGRAM;  Surgeon: Barbaraann Darryle Ned, MD;  Location: [facility] INVASIVE CV LAB;  Service: Cardiovascular;  Laterality: N/A;   Social History:  reports that she has been smoking cigarettes. She started smoking about 38 years ago. She has a 19.3 pack-year smoking history. She has been exposed to tobacco smoke. She has never used smokeless tobacco. She reports current drug use. Drug: Marijuana. She reports that she does not drink alcohol .  Allergies  Allergen Reactions   Codeine Shortness Of Breath, Swelling and Rash    Throat swelling   Hydrocodone  Shortness Of Breath, Swelling and Rash   Ketorolac  Nausea And Vomiting, Swelling and Nausea Only    Pt reports n/v and swelling to throat   Morphine And Codeine Shortness Of Breath, Swelling and Rash   Penicillins Shortness Of Breath, Swelling and Other (See Comments)    Throat swells 02/06/21--TOLERATES CEFTRIAXONE      Nickel Rash   Pregabalin  Other (See Comments)   Acetaminophen      Per MD patient states she can't take  Tylenol  because of liver enzymes   Atarax  [Hydroxyzine ] Itching and Swelling    Mildly swollen throat   Ativan  [Lorazepam ] Other (See Comments)    Migraines.   Oxycodone -Acetaminophen  Nausea Only and Nausea And Vomiting   Strawberry Extract Swelling   Watermelon Concentrate Swelling   Albuterol  Palpitations   Benadryl  [Diphenhydramine  Hcl] Palpitations   Humira  [Adalimumab ] Rash   Latex Rash   Remicade  [Infliximab ] Rash    Blisters and Welts    Tramadol  Rash    Rash and itching    Family History  Problem Relation Age of Onset   Cancer Mother    Heart failure Mother    Hypertension Mother    Heart attack Mother    Heart attack Father 20   Cancer Father 25   Heart failure Father    Diabetes Father    Crohn's disease Cousin     Prior to Admission medications   Medication Sig Start Date End Date Taking? Authorizing Provider  albuterol  (ACCUNEB ) 1.25 MG/3ML nebulizer solution Take 1 ampule by nebulization every 6 (six) hours as needed for wheezing or shortness of breath. 04/21/23  Yes [provider]  ALPRAZolam  (XANAX )  1 MG tablet Take 1 mg by mouth 3 (three) times daily. 07/11/19  Yes [provider]  amitriptyline  (ELAVIL ) 100 MG tablet Take 100 mg by mouth at bedtime.   Yes [provider]  apixaban  (ELIQUIS ) 2.5 MG TABS tablet Take 1 tablet (2.5 mg total) by mouth 2 (two) times daily. 04/25/23  Yes Johnson, Clanford L, MD  cetirizine (ZYRTEC) 10 MG tablet Take 10 mg by mouth daily. 01/08/22  Yes [provider]  DULoxetine  (CYMBALTA ) 30 MG capsule Take 1 capsule (30 mg total) by mouth daily. 04/25/23  Yes Johnson, Clanford L, MD  feeding supplement (ENSURE ENLIVE / ENSURE PLUS) LIQD Take 237 mLs by mouth 2 (two) times daily between meals. 01/04/22  Yes Ricky Fines, MD  gabapentin  (NEURONTIN ) 300 MG capsule Take 300 mg by mouth 2 (two) times daily. 10/15/22  Yes [provider]  HYDROmorphone  (DILAUDID ) 2 MG tablet Take 1 tablet (2 mg  total) by mouth every 6 (six) hours as needed for severe pain (pain score 7-10). 04/25/23  Yes Johnson, Clanford L, MD  hydrOXYzine  (ATARAX ) 50 MG tablet Take 25-50 mg by mouth 3 (three) times daily as needed for anxiety or nausea.   Yes [provider]  ibuprofen  (ADVIL ) 200 MG tablet Take 200 mg by mouth every 6 (six) hours as needed for mild pain (pain score 1-3).   Yes [provider]  levETIRAcetam  (KEPPRA ) 750 MG tablet Take 750 mg by mouth 2 (two) times daily. 09/30/20  Yes [provider]  loperamide  (IMODIUM ) 2 MG capsule Take 1 capsule (2 mg total) by mouth every 6 (six) hours. 04/25/23  Yes Johnson, Clanford L, MD  mesalamine  (ROWASA ) 4 g enema Place 60 mLs (4 g total) rectally at bedtime. 04/22/23  Yes Rourk, Lamar HERO, MD  metoprolol  tartrate (LOPRESSOR ) 25 MG tablet Take 0.5 tablets (12.5 mg total) by mouth 2 (two) times daily. 04/10/23  Yes Johnson, Clanford L, MD  midodrine  (PROAMATINE ) 5 MG tablet Take 1 tablet (5 mg total) by mouth 3 (three) times daily with meals. 04/10/23  Yes Johnson, Clanford L, MD  nitroGLYCERIN  (NITROSTAT ) 0.4 MG SL tablet Place 1 tablet (0.4 mg total) under the tongue every 5 (five) minutes as needed for chest pain. 04/25/23  Yes Johnson, Clanford L, MD  ondansetron  (ZOFRAN ) 4 MG tablet Take 4 mg by mouth every 8 (eight) hours as needed. 04/25/23  Yes [provider]  pantoprazole  (PROTONIX ) 40 MG tablet Take 1 tablet (40 mg total) by mouth 2 (two) times daily. 01/03/22  Yes Ricky Fines, MD  prochlorperazine  (COMPAZINE ) 5 MG tablet Take 1 tablet (5 mg total) by mouth every 6 (six) hours as needed for refractory nausea / vomiting. 04/25/23  Yes Vicci Afton CROME, MD    Physical Exam: Vitals:   05/10/23 1530 05/10/23 1545 05/10/23 1702 05/10/23 1839  BP: 119/66 137/77 (!) 131/47 123/70  Pulse: (!) 113 (!) 109 (!) 115 (!) 117  Resp:  16 16 14   Temp:  98 F (36.7 C) 97.8 F (36.6 C) 97.9 F (36.6 C)  TempSrc:  Oral Oral  Oral  SpO2: 97% 100% 100% 100%  Weight:      Height:       General exam: Alert, awake, oriented x 3 Respiratory system: Clear to auscultation. Respiratory effort normal. Cardiovascular system: Sinus tachycardia, no rubs, no gallops, no JVD.   Gastrointestinal system: Abdomen is nondistended, soft and with positive bowel sounds.  Colostomy in place.  Surrounding area with erythematous  changes extending all the way into her groin and flank area (fungal dermatitis) Central nervous system: No focal neurological deficits. Extremities: No cyanosis, clubbing or edema. Skin: No petechiae; erythematous changes as reported in her abdominal exam (please refer to epic media for further details). Psychiatry: Judgement and insight appear normal. Mood & affect appropriate.   Data Reviewed: CBC: WBCs 14.4, hemoglobin 11.6, platelet count 812K Basic metabolic panel: Sodium 128, potassium 3.6, chloride 83, bicarb 29, glucose 111, BUN 38, creatinine 2.77, GFR 20 and anion gap 16 Urinalysis: Negative nitrite, large leukocyte esterase (11-20 and right bacteria; patient denies dysuria, hematuria or any other complaints.  Specific gravity 1.010 Magnesium : 1.4  Assessment and Plan: 1-acute renal failure/dehydration/hyponatremia -Appears to be prerenal azotemia in nature -Continue aggressive fluid resuscitation -Follow renal function -Replete electrolytes and follow trend.  2-hypomagnesemia -In the setting of GI losses -Will replete electrolytes and follow trend.  3-fungal dermatitis -Will treat with topical nystatin  -Patient advised to keep area clean and dry.  4-anxiety/depression -Resume home antidepressant and anxiolytic therapy -No suicidal ideation or hallucinations currently appreciated.  5-history of asthma/COPD -Continue as needed bronchodilator management.  6-history of seizure -Continue treatment with Keppra .  7-paroxysmal SVT -Will resume the use of Lopressor  -Follow vital  signs.  8-history of orthostatic hypotension -Continue treatment with midodrine .  9-GERD -Continue the use of PPI.  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  11-chronic pain/neuropathy -Resume home analgesic therapy and continue Neurontin .    Advance Care Planning:   Code Status: Full Code   Consults: None  Family Communication: No family at bedside.  Severity of Illness: The appropriate patient status for this patient is OBSERVATION. Observation status is judged to be reasonable and necessary in order to provide the required intensity of service to ensure the patient's safety. The patient's presenting symptoms, physical exam findings, and initial radiographic and laboratory data in the context of their medical condition is felt to place them at decreased risk for further clinical deterioration. Furthermore, it is anticipated that the patient will be medically stable for discharge from the hospital within 2 midnights of admission.   Author: Eric Nunnery, MD 05/10/2023 7:41 PM  For on call review [email or web address].

## 2023-05-10 NOTE — ED Provider Notes (Signed)
 [place] EMERGENCY DEPARTMENT AT [facility] Provider Note   CSN: [id number] Arrival date & time: 05/10/23  1141     History  No chief complaint on file.   [name] is a 53 y.o. female.  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  She has been in out of the hospital because she was running out of ostomy supplies, she unfortunately has candidemia at her stoma site, she was given multiple antibiotics, her hospitalization during the colectomy was prolonged secondary to a Candida fungemia.  Presents because of having increasing irritation of the skin oozing of the skin and the inability of the ostomy bag to stay attached to her skin.  She has changed to 3 times overnight but it continues to leak and cause significant breakdown of the skin.  No shortness of breath, no significant abdominal tenderness, stool has been brown and loose to soft but not watery and no bloody stools.  Home Medications Prior to Admission medications   Medication Sig Start Date End Date Taking? Authorizing Provider  acetaminophen  (TYLENOL ) 325 MG tablet Take 2 tablets (650 mg total) by mouth every 6 (six) hours as needed for mild pain or headache (or Fever >/= 101). 01/03/22   Ricky Fines, MD  albuterol  (ACCUNEB ) 1.25 MG/3ML nebulizer solution Take 1 ampule by nebulization every 6 (six) hours as needed for wheezing or shortness of breath. 04/21/23   [provider]  ALPRAZolam  (XANAX ) 1 MG tablet Take 1 mg by mouth 3 (three) times daily. 07/11/19   [provider]  amitriptyline  (ELAVIL ) 100 MG tablet Take 100 mg by mouth at bedtime.    [provider]  apixaban  (ELIQUIS ) 2.5 MG TABS tablet Take 1 tablet (2.5 mg total) by mouth 2 (two) times daily. 04/25/23   Johnson, Clanford  L, MD  cetirizine (ZYRTEC) 10 MG tablet Take 10 mg by mouth daily. 01/08/22   [provider]  DULoxetine  (CYMBALTA ) 30 MG capsule Take 1 capsule (30 mg total) by mouth daily. 04/25/23   Johnson, Clanford L, MD  feeding supplement (ENSURE ENLIVE / ENSURE PLUS) LIQD Take 237 mLs by mouth 2 (two) times daily between meals. 01/04/22   Ricky Fines, MD  fentaNYL  (DURAGESIC ) 25 MCG/HR Place 1 patch onto the skin every 3 (three) days. 03/21/23   [provider]  gabapentin  (NEURONTIN ) 300 MG capsule Take 300 mg by mouth 2 (two) times daily. 10/15/22   [provider]  HYDROmorphone  (DILAUDID ) 2 MG tablet Take 1 tablet (2 mg total) by mouth every 6 (six) hours as needed for severe pain (pain score 7-10). 04/25/23   Johnson, Clanford L, MD  hydrOXYzine  (ATARAX ) 50 MG tablet Take 25-50 mg by mouth 3 (three) times daily as needed for anxiety or nausea.    [provider]  Isavuconazonium Sulfate  (CRESEMBA ) 186 MG CAPS Take 1 capsule (186 mg total) by mouth daily. 04/11/23 05/11/23  Johnson, Clanford L, MD  levETIRAcetam  (KEPPRA ) 750 MG tablet Take 750 mg by mouth 2 (two) times daily. 09/30/20   [provider]  loperamide  (IMODIUM ) 2 MG capsule Take 1 capsule (2 mg total) by mouth every 6 (six) hours. 04/25/23   Johnson, Clanford L, MD  magnesium  oxide (MAG-OX) 400 (240 Mg) MG tablet Take 1 tablet (400 mg total) by  mouth 2 (two) times daily. 04/25/23   Johnson, Clanford L, MD  mesalamine  (ROWASA ) 4 g enema Place 60 mLs (4 g total) rectally at bedtime. 04/22/23   Rourk, Lamar HERO, MD  metoprolol  tartrate (LOPRESSOR ) 25 MG tablet Take 0.5 tablets (12.5 mg total) by mouth 2 (two) times daily. 04/10/23   Johnson, Clanford L, MD  midodrine  (PROAMATINE ) 5 MG tablet Take 1 tablet (5 mg total) by mouth 3 (three) times daily with meals. 04/10/23   Johnson, Clanford L, MD  nitroGLYCERIN  (NITROSTAT ) 0.4 MG SL tablet Place 1 tablet (0.4 mg total) under the tongue every 5 (five) minutes as  needed for chest pain. 04/25/23   Johnson, Clanford L, MD  ondansetron  (ZOFRAN -ODT) 4 MG disintegrating tablet Take 1 tablet (4 mg total) by mouth every 8 (eight) hours as needed for nausea or vomiting. 04/25/23   Johnson, Clanford L, MD  pantoprazole  (PROTONIX ) 40 MG tablet Take 1 tablet (40 mg total) by mouth 2 (two) times daily. 01/03/22   Ricky Fines, MD  prochlorperazine  (COMPAZINE ) 5 MG tablet Take 1 tablet (5 mg total) by mouth every 6 (six) hours as needed for refractory nausea / vomiting. 04/25/23   Johnson, Clanford L, MD      Allergies    Codeine, Hydrocodone , Ketorolac , Morphine and codeine, Penicillins, Nickel, Pregabalin , Acetaminophen , Atarax  [hydroxyzine ], Ativan  [lorazepam ], Oxycodone -acetaminophen , Strawberry extract, Watermelon concentrate, Albuterol , Benadryl  [diphenhydramine  hcl], Humira  [adalimumab ], Latex, Remicade  [infliximab ], and Tramadol     Review of Systems   Review of Systems  All other systems reviewed and are negative.   Physical Exam Updated Vital Signs BP 132/68   Pulse (!) 116   Temp 98.5 F (36.9 C) (Oral)   Resp 17   Ht 1.626 m (5' 4)   Wt 52.2 kg   SpO2 100%   BMI 19.74 kg/m  Physical Exam Vitals and nursing note reviewed.  Constitutional:      General: She is not in acute distress.    Appearance: She is well-developed.  HENT:     Head: Normocephalic and atraumatic.     Mouth/Throat:     Pharynx: No oropharyngeal exudate.  Eyes:     General: No scleral icterus.       Right eye: No discharge.        Left eye: No discharge.     Conjunctiva/sclera: Conjunctivae normal.     Pupils: Pupils are equal, round, and reactive to light.  Neck:     Thyroid : No thyromegaly.     Vascular: No JVD.  Cardiovascular:     Rate and Rhythm: Regular rhythm. Tachycardia present.     Heart sounds: Normal heart sounds. No murmur heard.    No friction rub. No gallop.  Pulmonary:     Effort: Pulmonary effort is normal. No respiratory distress.     Breath  sounds: Normal breath sounds. No wheezing or rales.  Abdominal:     General: Bowel sounds are normal. There is no distension.     Palpations: Abdomen is soft. There is no mass.     Tenderness: There is no abdominal tenderness.     Comments: Abdominal scar well-healed, ostomy site with erythema oozing and what appears to be candidal infection of the skin, ostomy bag has not adhered to the skin, brown stool present on the abdominal wall  Musculoskeletal:        General: No tenderness. Normal range of motion.     Cervical back: Normal range of motion and neck supple.  Right lower leg: No edema.     Left lower leg: No edema.  Lymphadenopathy:     Cervical: No cervical adenopathy.  Skin:    General: Skin is warm and dry.     Findings: No erythema or rash.  Neurological:     Mental Status: She is alert.     Coordination: Coordination normal.  Psychiatric:        Behavior: Behavior normal.     ED Results / Procedures / Treatments   Labs (all labs ordered are listed, but only abnormal results are displayed) Labs Reviewed  CBC WITH DIFFERENTIAL/PLATELET - Abnormal; Notable for the following components:      Result Value   WBC 14.4 (*)    Hemoglobin 11.6 (*)    HCT 34.5 (*)    RDW 16.0 (*)    Platelets 812 (*)    Neutro Abs 9.8 (*)    Abs Immature Granulocytes 0.12 (*)    All other components within normal limits  BASIC METABOLIC PANEL - Abnormal; Notable for the following components:   Sodium 128 (*)    Chloride 83 (*)    Glucose, Bld 111 (*)    BUN 38 (*)    Creatinine, Ser 2.77 (*)    GFR, Estimated 20 (*)    Anion gap 16 (*)    All other components within normal limits  URINE CULTURE  URINALYSIS, ROUTINE W REFLEX MICROSCOPIC    EKG None  Radiology No results found.  Procedures Procedures    Medications Ordered in ED Medications  0.9 %  sodium chloride  infusion (has no administration in time range)    ED Course/ Medical Decision Making/ A&P                                  Medical Decision Making Amount and/or Complexity of Data Reviewed Labs: ordered.  Risk Prescription drug management. Decision regarding hospitalization.    This patient presents to the ED for concern of STEMI back dysfunction differential diagnosis includes Candida infection of the skin    Additional history obtained:  Additional history obtained from medical record External records from outside source obtained and reviewed including prior admission notes   Lab Tests:  I Ordered, and personally interpreted labs.  The pertinent results include: BC shows a leukocytosis of about 14,000, this is new compared to prior.  Hemoglobin looks better at 11.6 Metabolic panel is abnormal with a creatinine of 2.77, had been 0.82 weeks ago.  BUN is up at 38 and hyponatremic at 128.  This is consistent with an acute kidney injury Urinalysis requested, patient not able to given at this time    Medicines ordered and prescription drug management:  I ordered medication including IV fluids for acute renal failure Reevaluation of the patient after these medicines showed that the patient improving I have reviewed the patients home medicines and have made adjustments as needed   Problem List / ED Course:  Patient has acute kidney injury, I have asked gastroenterology as well as general surgery about any advice they have on taking care of of ostomies and the wound breakdown, I have been able to contact the ostomy care nurse through the help of the general surgeon and they will come to see the patient to give their advice on taking care of the ostomy bag in the presence of the erythematous and oozing and weeping skin lesions. Will consult with hospitalist  for admission due to acute renal failure.  Thankfully the patient is not vomiting though she is mildly tachycardic   Social Determinants of Health:  Recent colectomy for fulminant C. difficile colitis           Final  Clinical Impression(s) / ED Diagnoses Final diagnoses:  Acute renal failure, unspecified acute renal failure type (HCC)  Hyponatremia  Leukocytosis, unspecified type    Rx / DC Orders ED Discharge Orders     None         Cleotilde Rogue, MD 05/10/23 1433

## 2023-05-10 NOTE — Progress Notes (Signed)
 Nurse called to get report, ED nurse not available, was in with another patient. ED nurse to call report when available.

## 2023-05-10 NOTE — Final Consult Note (Addendum)
 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 Vanderbilt  Stoma type/location: RLQ ileostomy  Stomal assessment/size: 7/8 x 1 1/4 oval, slightly above skin level, pink moist and productive of soft brown stool  Peristomal assessment: erythema and denuded skin.  ICD-10 CM Codes for Irritant Dermatitis L24B3 - Related to fecal or urinary stoma or fistula  Treatment options for stomal/peristomal skin: crusted with no sting barrier wipe and stoma powder Soila #6)  Output minimal soft brown stool (patient continues to report high liquid stool output)  Ostomy pouching: placed a small Eakin pouch today to see if we can allow the skin to rest and heal Soila [phone]); patient needs 2 1/4 soft convex skin barrier Soila [phone]) and 2 1/4 pouch Soila #234), 2 barrier ring Soila [phone])  Education provided: patient continues to struggle with ileostomy months after surgery after multiple teaching sessions.  Patient understands steps to perform pouch change. We did review crusting as patient made comment that the glue was not working referring to stoma powder. Reviewed with patient that stoma powder is not glue but is to be used with no sting barrier wipe to help crust skin prior to application of skin barrier if skin is broken down and weeping.   Crusting allows for a dry pouching surface for skin barrier to stick to as well as healing skin.  Patients stoma appears more oval at today's visit.    Patient does tell this WOC RN that she has obtained ostomy supplies and that these are coming to her house monthly. Patient has Medicaid and can obtain supplies through [facility] [phone].  We also discussed ostomy clinic and I provided another handout for this as well. Per TOC at one of the previous visits patient can get a ride to clinic through [facility] transportation.   Enrolled  patient in [employer] DC program: no established ostomy   Patient has been seen in ED and on multiple admissions and provided ostomy belts and stoma powder and no sting barrier wipes.  I obtained (4) sets of 2 3/4 soft convex skin barrier Soila [phone]), high output 2 3/4 pouch Soila [phone]) and 2 barrier rings Soila [phone]) and put in a take home bag for patient. Left a 2 1/4 version of above for bedside use if preferred over Eakin pouch.  Skin should be crusted with each pouch change as follows: Sprinkle stoma powder Soila #6) over any irritated skin, brush away excess powder Tap lightly over the powder with Cavilon No-Sting barrier wipe (skin barrier wipe-white and blue package)  Allow to dry Apply another layer of powder following the same steps up to three layers.  After dry proceed with routine pouching    WOC team will not follow.  Reconsult if further needs arise.   Thank you,    Powell Bar MSN, RN-BC, [employer] [phone]

## 2023-05-10 NOTE — ED Triage Notes (Signed)
 Pt arrived [facility] for colostomy bag not attaching to her correctly. Pt stated she also feels like she may have an infection in her feces due to it burns like fire. Liquid feces noted and redness around and below colostomy site.

## 2023-05-11 DIAGNOSIS — Z7901 Long term (current) use of anticoagulants: Secondary | ICD-10-CM | POA: Diagnosis not present

## 2023-05-11 DIAGNOSIS — F419 Anxiety disorder, unspecified: Secondary | ICD-10-CM | POA: Diagnosis present

## 2023-05-11 DIAGNOSIS — E1140 Type 2 diabetes mellitus with diabetic neuropathy, unspecified: Secondary | ICD-10-CM | POA: Diagnosis present

## 2023-05-11 DIAGNOSIS — F32A Depression, unspecified: Secondary | ICD-10-CM | POA: Diagnosis present

## 2023-05-11 DIAGNOSIS — N179 Acute kidney failure, unspecified: Secondary | ICD-10-CM | POA: Diagnosis present

## 2023-05-11 DIAGNOSIS — E871 Hypo-osmolality and hyponatremia: Secondary | ICD-10-CM | POA: Diagnosis not present

## 2023-05-11 DIAGNOSIS — F4310 Post-traumatic stress disorder, unspecified: Secondary | ICD-10-CM | POA: Diagnosis present

## 2023-05-11 DIAGNOSIS — I10 Essential (primary) hypertension: Secondary | ICD-10-CM | POA: Diagnosis present

## 2023-05-11 DIAGNOSIS — B369 Superficial mycosis, unspecified: Secondary | ICD-10-CM | POA: Diagnosis present

## 2023-05-11 DIAGNOSIS — Z933 Colostomy status: Secondary | ICD-10-CM | POA: Diagnosis not present

## 2023-05-11 DIAGNOSIS — K219 Gastro-esophageal reflux disease without esophagitis: Secondary | ICD-10-CM | POA: Diagnosis present

## 2023-05-11 DIAGNOSIS — J4489 Other specified chronic obstructive pulmonary disease: Secondary | ICD-10-CM | POA: Diagnosis present

## 2023-05-11 DIAGNOSIS — E876 Hypokalemia: Secondary | ICD-10-CM

## 2023-05-11 DIAGNOSIS — G894 Chronic pain syndrome: Secondary | ICD-10-CM | POA: Diagnosis present

## 2023-05-11 DIAGNOSIS — F17210 Nicotine dependence, cigarettes, uncomplicated: Secondary | ICD-10-CM | POA: Diagnosis present

## 2023-05-11 DIAGNOSIS — K5090 Crohn's disease, unspecified, without complications: Secondary | ICD-10-CM | POA: Diagnosis present

## 2023-05-11 DIAGNOSIS — E860 Dehydration: Secondary | ICD-10-CM | POA: Diagnosis present

## 2023-05-11 DIAGNOSIS — Z9049 Acquired absence of other specified parts of digestive tract: Secondary | ICD-10-CM | POA: Diagnosis not present

## 2023-05-11 DIAGNOSIS — Z885 Allergy status to narcotic agent status: Secondary | ICD-10-CM | POA: Diagnosis not present

## 2023-05-11 DIAGNOSIS — D72829 Elevated white blood cell count, unspecified: Secondary | ICD-10-CM | POA: Diagnosis not present

## 2023-05-11 DIAGNOSIS — Z91040 Latex allergy status: Secondary | ICD-10-CM | POA: Diagnosis not present

## 2023-05-11 DIAGNOSIS — I4719 Other supraventricular tachycardia: Secondary | ICD-10-CM | POA: Diagnosis present

## 2023-05-11 DIAGNOSIS — G40909 Epilepsy, unspecified, not intractable, without status epilepticus: Secondary | ICD-10-CM | POA: Diagnosis present

## 2023-05-11 DIAGNOSIS — Z8249 Family history of ischemic heart disease and other diseases of the circulatory system: Secondary | ICD-10-CM | POA: Diagnosis not present

## 2023-05-11 LAB — BASIC METABOLIC PANEL
Anion gap: 10 (ref 5–15)
BUN: 25 mg/dL — ABNORMAL HIGH (ref 6–20)
CO2: 26 mmol/L (ref 22–32)
Calcium: 8.6 mg/dL — ABNORMAL LOW (ref 8.9–10.3)
Chloride: 93 mmol/L — ABNORMAL LOW (ref 98–111)
Creatinine, Ser: 1.38 mg/dL — ABNORMAL HIGH (ref 0.44–1.00)
GFR, Estimated: 45 mL/min — ABNORMAL LOW (ref >60)
Glucose, Bld: 91 mg/dL (ref 70–99)
Potassium: 2.7 mmol/L — CL (ref 3.5–5.1)
Sodium: 129 mmol/L — ABNORMAL LOW (ref 135–145)

## 2023-05-11 LAB — URINE CULTURE

## 2023-05-11 LAB — CBC
HCT: 28.2 % — ABNORMAL LOW (ref 36.0–46.0)
Hemoglobin: 9.4 g/dL — ABNORMAL LOW (ref 12.0–15.0)
MCH: 27.7 pg (ref 26.0–34.0)
MCHC: 33.3 g/dL (ref 30.0–36.0)
MCV: 83.2 fL (ref 80.0–100.0)
Platelets: 664 10*3/uL — ABNORMAL HIGH (ref 150–400)
RBC: 3.39 MIL/uL — ABNORMAL LOW (ref 3.87–5.11)
RDW: 15.9 % — ABNORMAL HIGH (ref 11.5–15.5)
WBC: 10.1 10*3/uL (ref 4.0–10.5)
nRBC: 0 % (ref 0.0–0.2)

## 2023-05-11 LAB — MAGNESIUM: Magnesium: 2.3 mg/dL (ref 1.7–2.4)

## 2023-05-11 MED ORDER — POTASSIUM CHLORIDE 10 MEQ/100ML IV SOLN
10.0000 meq | INTRAVENOUS | Status: AC
  Administered 2023-05-11 (×4): 10 meq via INTRAVENOUS
  Filled 2023-05-11 (×4): qty 100

## 2023-05-11 MED ORDER — POTASSIUM CHLORIDE CRYS ER 20 MEQ PO TBCR
40.0000 meq | EXTENDED_RELEASE_TABLET | Freq: Once | ORAL | Status: AC
  Administered 2023-05-11: 40 meq via ORAL
  Filled 2023-05-11: qty 2

## 2023-05-11 NOTE — Progress Notes (Signed)
  Progress Note   Patient: [name] FMW:[id number] DOB: [birth year] DOA: 05/10/2023     0 DOS: the patient was seen and examined on 05/11/2023   Brief hospital admission course: [name] is a 53 y.o. 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 Patient reports some decreasing oral intake as everything that she eat or drink goes through her.   Workup in the ED demonstrating erythematous changes/fungal dermatitis acute renal failure and hyponatremia.  Fluid resuscitation has been initiated and TRH consulted to place patient in the hospital for further evaluation and management.  Assessment and Plan: 1-acute renal failure/dehydration/hyponatremia/hypokalemia -All associated with GI losses secondary to high output ostomy -Continue fluid resuscitation and electrolyte repletion -Renal function adequately improving -Patient advised to maintain good oral intake. -Potassium around 2.7 this morning.  2-hypomagnesemia -Repleted -Continue to follow electrolytes trend.  3-fungal dermatitis -Try to keep area clean and dry as much as possible -Continue treatment with nystatin   4-history of seizure disorder -Continue treatment with Keppra .  5-paroxysmal SVT -Continue treatment with metoprolol  -Follow vital signs.  6-history of asthma/COPD -No acute exacerbation currently appreciated -Continue as needed bronchodilator management.  7-anxiety/depression -Continue home antidepressant and anxiolytic therapy -No suicidal ideation or hallucination -Flat affect appreciated on exam.  8-history of orthostatic  hypotension -Continue treatment with midodrine .  9-GERD -Continue PPI.  10-tobacco abuse -Cessation counseling provided -Continue nicotine  patch.  11-chronic pain/neuropathy -Continue home analgesic therapy and the use of Neurontin .   Subjective:  Afebrile, no chest pain, no nausea, no vomiting.  Reports feeling better.  Physical Exam: Vitals:   05/11/23 0503 05/11/23 0813 05/11/23 1110 05/11/23 1251  BP: (!) 104/53 (!) 112/45 (!) 90/47 (!) 105/53  Pulse: 86 95 82 88  Resp: 16     Temp: 97.9 F (36.6 C)  97.9 F (36.6 C) 97.8 F (36.6 C)  TempSrc: Oral  Oral Oral  SpO2: 100%  99% 100%  Weight: 58.8 kg     Height:       General exam: Alert, awake, oriented x 3.  No acute distress. Respiratory system: Clear to auscultation. Respiratory effort normal. Cardiovascular system:RRR. No murmurs, rubs, gallops. Gastrointestinal system: Abdomen is soft, nondistended, soft and with no guarding.  Colostomy in place.  Positive bowel sounds. Central nervous system: No focal neurological deficits. Extremities: No cyanosis or clubbing. Skin: No petechiae; surrounding ostomy area/flank and groin on right side with fungal dermatitis changes.  No purulent discharge. Psychiatry: Judgement and insight appear normal. Mood & affect appropriate.   Data Reviewed: Basic metabolic panel: Sodium 129, potassium 2.7, chloride 93, bicarb 26, BUN 25, creatinine 1.38 and GFR 45 CBC: WBCs 10.1, hemoglobin 9.4 and platelet count 664K Magnesium :2.3  Family Communication: Family at bedside.  Disposition: Status is: Inpatient Remains inpatient appropriate because: Continue IV therapy; follow renal function trend.   Planned Discharge Destination: Home  Time spent: 50 minutes  Author: Eric Nunnery, MD 05/11/2023 2:33 PM  For on call review [email or web address].

## 2023-05-11 NOTE — Progress Notes (Signed)
 05-11-2023 0430.  Notified MD of critical potassium and of change in creatnine.  No new orders received.

## 2023-05-11 NOTE — TOC CM/SW Note (Signed)
 Transition of Care [facility]) - Inpatient Brief Assessment   Patient Details  Name: [name] MRN: [id number] Date of Birth: 10-11-1970  Transition of Care [facility]) CM/SW Contact:    Lucie Lunger, LCSWA Phone Number: 05/11/2023, 12:12 PM   Clinical Narrative: Transition of Care Department [facility]) has reviewed patient and no TOC needs have been identified at this time. We will continue to monitor patient advancement through interdisciplinary progression rounds. If new patient transition needs arise, please place a TOC consult.  Transition of Care Asessment: Insurance and Status: Insurance coverage has been reviewed Patient has primary care physician: Yes Home environment has been reviewed: from home Prior level of function:: independent Prior/Current Home Services: No current home services Social Drivers of Health Review: SDOH reviewed no interventions necessary Readmission risk has been reviewed: Yes Transition of care needs: no transition of care needs at this time

## 2023-05-12 DIAGNOSIS — N179 Acute kidney failure, unspecified: Secondary | ICD-10-CM | POA: Diagnosis not present

## 2023-05-12 LAB — BASIC METABOLIC PANEL
Anion gap: 6 (ref 5–15)
BUN: 14 mg/dL (ref 6–20)
CO2: 25 mmol/L (ref 22–32)
Calcium: 8.8 mg/dL — ABNORMAL LOW (ref 8.9–10.3)
Chloride: 102 mmol/L (ref 98–111)
Creatinine, Ser: 0.94 mg/dL (ref 0.44–1.00)
GFR, Estimated: >60 mL/min (ref >60)
Glucose, Bld: 94 mg/dL (ref 70–99)
Potassium: 3.9 mmol/L (ref 3.5–5.1)
Sodium: 133 mmol/L — ABNORMAL LOW (ref 135–145)

## 2023-05-12 MED ORDER — HYDROMORPHONE HCL 2 MG PO TABS: 2.0000 mg | ORAL_TABLET | Freq: Four times a day (QID) | ORAL | 0 refills | Status: DC | PRN

## 2023-05-12 MED ORDER — PROCHLORPERAZINE MALEATE 5 MG PO TABS: 5.0000 mg | ORAL_TABLET | Freq: Four times a day (QID) | ORAL | 0 refills | Status: DC | PRN

## 2023-05-12 MED ORDER — ONDANSETRON HCL 4 MG PO TABS: 4.0000 mg | ORAL_TABLET | Freq: Four times a day (QID) | ORAL | 0 refills | Status: DC | PRN

## 2023-05-12 MED ORDER — NYSTATIN 100000 UNIT/GM EX POWD: Freq: Three times a day (TID) | CUTANEOUS | 0 refills | Status: DC

## 2023-05-12 NOTE — Discharge Summary (Signed)
 Physician Discharge Summary  [name] FMW:[id number] DOB: 1970/05/14 DOA: 05/10/2023  PCP: Job Browning, PA-C  Admit date: 05/10/2023  Discharge date: 05/12/2023  Admitted From:Home  Disposition:  Home  Recommendations for Outpatient Follow-up:  Follow up with PCP in 1-2 weeks Continue on nausea medications as needed Continue Imodium  to control high output ostomy Refills given temporarily for Dilaudid  Continue Diflucan  as ordered Continue other home medications as prior  Home Health: None  Equipment/Devices: None  Discharge Condition:Stable  CODE STATUS: Full  Diet recommendation: Heart Healthy  Brief/Interim Summary: [name] is a 53 y.o. 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 Patient reports some decreasing oral intake as everything that she eat or drink goes through her.   Workup in the ED demonstrating erythematous changes/fungal dermatitis acute renal failure and hyponatremia.  Fluid resuscitation has been initiated and TRH consulted to place patient in the hospital for further evaluation and management.  Patient required IV fluid and her AKI has now improved as well as her electrolyte abnormalities.  She has been started on nystatin  for her fungal dermatitis which she will continue and has been given antiemetics as needed at home.  Discharge Diagnoses:  Principal Problem:   Acute renal failure (ARF) (HCC)  Principal discharge diagnosis: Prerenal AKI in the setting of GI losses with high output ostomy.  Hyponatremia and hypokalemia as well as hypomagnesemia  associated.  Discharge Instructions  Discharge Instructions     Diet - low sodium heart healthy   Complete by: As directed    Increase activity slowly   Complete by: As directed       Allergies as of 05/12/2023       Reactions   Codeine Shortness Of Breath, Swelling, Rash   Throat swelling   Hydrocodone  Shortness Of Breath, Swelling, Rash   Ketorolac  Nausea And Vomiting, Swelling, Nausea Only   Pt reports n/v and swelling to throat   Morphine And Codeine Shortness Of Breath, Swelling, Rash   Penicillins Shortness Of Breath, Swelling, Other (See Comments)   Throat swells 02/06/21--TOLERATES CEFTRIAXONE     Nickel Rash   Pregabalin  Other (See Comments)   Acetaminophen     Per MD patient states she can't take Tylenol  because of liver enzymes   Atarax  [hydroxyzine ] Itching, Swelling   Mildly swollen throat   Ativan  [lorazepam ] Other (See Comments)   Migraines.   Oxycodone -acetaminophen  Nausea Only, Nausea And Vomiting   Strawberry Extract Swelling   Watermelon Concentrate Swelling   Albuterol  Palpitations   Benadryl  [diphenhydramine  Hcl] Palpitations   Humira  [adalimumab ] Rash   Latex Rash   Remicade  [infliximab ] Rash   Blisters and Welts   Tramadol  Rash   Rash and itching        Medication List     TAKE these medications    albuterol  1.25 MG/3ML nebulizer solution Commonly known as: ACCUNEB  Take 1 ampule by nebulization every 6 (six) hours as needed for wheezing or shortness of breath.   ALPRAZolam  1 MG tablet Commonly known as: XANAX  Take 1 mg by mouth 3 (three) times daily.   amitriptyline  100 MG tablet Commonly known as: ELAVIL  Take 100 mg by mouth at bedtime.   apixaban  2.5 MG Tabs tablet Commonly known as:  ELIQUIS  Take 1 tablet (2.5 mg total) by mouth 2 (two) times daily.   cetirizine 10 MG tablet Commonly known as: ZYRTEC Take 10 mg by mouth daily.   DULoxetine  30 MG capsule Commonly known as: CYMBALTA  Take 1 capsule (30 mg total) by mouth  daily.   feeding supplement Liqd Take 237 mLs by mouth 2 (two) times daily between meals.   gabapentin  300 MG capsule Commonly known as: NEURONTIN  Take 300 mg by mouth 2 (two) times daily.   HYDROmorphone  2 MG tablet Commonly known as: DILAUDID  Take 1 tablet (2 mg total) by mouth every 6 (six) hours as needed for severe pain (pain score 7-10).   hydrOXYzine  50 MG tablet Commonly known as: ATARAX  Take 25-50 mg by mouth 3 (three) times daily as needed for anxiety or nausea.   ibuprofen  200 MG tablet Commonly known as: ADVIL  Take 200 mg by mouth every 6 (six) hours as needed for mild pain (pain score 1-3).   levETIRAcetam  750 MG tablet Commonly known as: KEPPRA  Take 750 mg by mouth 2 (two) times daily.   loperamide  2 MG capsule Commonly known as: IMODIUM  Take 1 capsule (2 mg total) by mouth every 6 (six) hours.   mesalamine  4 g enema Commonly known as: ROWASA  Place 60 mLs (4 g total) rectally at bedtime.   metoprolol  tartrate 25 MG tablet Commonly known as: LOPRESSOR  Take 0.5 tablets (12.5 mg total) by mouth 2 (two) times daily.   midodrine  5 MG tablet Commonly known as: PROAMATINE  Take 1 tablet (5 mg total) by mouth 3 (three) times daily with meals.   nitroGLYCERIN  0.4 MG SL tablet Commonly known as: NITROSTAT  Place 1 tablet (0.4 mg total) under the tongue every 5 (five) minutes as needed for chest pain.   nystatin  powder Commonly known as: MYCOSTATIN /NYSTOP  Apply topically 3 (three) times daily.   ondansetron  4 MG tablet Commonly known as: ZOFRAN  Take 4 mg by mouth every 8 (eight) hours as needed. What changed: Another medication with the same name was added. Make sure you understand how and when to take each.   ondansetron  4 MG tablet Commonly known as: ZOFRAN  Take 1 tablet (4 mg total) by mouth every 6 (six) hours as needed for nausea. What changed: You were already taking a medication with the same name, and this prescription was added. Make sure you  understand how and when to take each.   pantoprazole  40 MG tablet Commonly known as: PROTONIX  Take 1 tablet (40 mg total) by mouth 2 (two) times daily.   prochlorperazine  5 MG tablet Commonly known as: COMPAZINE  Take 1 tablet (5 mg total) by mouth every 6 (six) hours as needed for refractory nausea / vomiting.        Follow-up Information     Job Browning, [place]. Schedule an appointment as soon as possible for a visit in 1 week(s).   Specialty: Physician Assistant Contact information: [address] [place] [address] [phone]                Allergies  Allergen Reactions   Codeine Shortness Of Breath, Swelling and Rash    Throat swelling   Hydrocodone  Shortness Of Breath, Swelling and Rash   Ketorolac  Nausea And Vomiting, Swelling and Nausea Only    Pt reports n/v and swelling to throat   Morphine And Codeine Shortness Of Breath, Swelling and Rash   Penicillins Shortness Of Breath, Swelling and Other (See Comments)    Throat swells 02/06/21--TOLERATES CEFTRIAXONE   Nickel Rash   Pregabalin  Other (See Comments)   Acetaminophen      Per MD patient states she can't take Tylenol  because of liver enzymes   Atarax  [Hydroxyzine ] Itching and Swelling    Mildly swollen throat   Ativan  [Lorazepam ] Other (See Comments)    Migraines.   Oxycodone -Acetaminophen  Nausea Only and Nausea And Vomiting   Strawberry Extract Swelling   Watermelon Concentrate Swelling   Albuterol  Palpitations   Benadryl  [Diphenhydramine  Hcl] Palpitations   Humira  [Adalimumab ] Rash   Latex Rash   Remicade  [Infliximab ] Rash    Blisters and Welts    Tramadol  Rash    Rash and itching    Consultations: None   Procedures/Studies: DG Chest Port 1 View Result Date: 04/23/2023 CLINICAL DATA:  Central line placement EXAM: PORTABLE CHEST 1 VIEW COMPARISON:  04/03/2023 FINDINGS: Left subclavian CVC tip in the mid SVC. Stable cardiomediastinal silhouette. No focal  consolidation, pleural effusion, or pneumothorax. No displaced rib fractures. IMPRESSION: Left subclavian CVC tip in the mid SVC. No pneumothorax. Electronically Signed   By: Norman Gatlin M.D.   On: 04/23/2023 11:55   US  EKG SITE RITE Result Date: 04/23/2023 If Site Rite image not attached, placement could not be confirmed due to current cardiac rhythm.  CT ABDOMEN PELVIS WO CONTRAST Result Date: 04/22/2023 CLINICAL DATA:  Abdominal pain EXAM: CT ABDOMEN AND PELVIS WITHOUT CONTRAST TECHNIQUE: Multidetector CT imaging of the abdomen and pelvis was performed following the standard protocol without IV contrast. RADIATION DOSE REDUCTION: This exam was performed according to the departmental dose-optimization program which includes automated exposure control, adjustment of the mA and/or kV according to patient size and/or use of iterative reconstruction technique. COMPARISON:  04/03/2023 FINDINGS: Lower chest: No pleural effusion or consolidative change. Hepatobiliary: There is diffuse hepatic steatosis with peripheral, wedge-shaped area of focal fatty sparing along segment 7 of the dome of liver. Cholecystectomy. No significant biliary ductal dilatation. Pancreas: Unremarkable. No pancreatic ductal dilatation or surrounding inflammatory changes. Spleen: Normal in size without focal abnormality. Adrenals/Urinary Tract: Normal adrenal glands. There is no nephrolithiasis, hydronephrosis or suspicious mass. Stomach/Bowel: Stomach filled with ingested debris. Stomach filled with ingested material. There are postsurgical changes from total colectomy with right ventral abdominal wall ileostomy. There is no pathologic dilatation of the small bowel loops. No wall thickening or inflammatory changes. Vascular/Lymphatic: Normal appearance of the abdominal aorta. No signs of abdominopelvic adenopathy. Reproductive: Status post hysterectomy. No adnexal masses. Other: There is no free fluid, fluid collections. No signs of  pneumoperitoneum. Residual thickening along the peritoneal reflections with mild persistent soft tissue stranding/edema within the mesenteric fat. No signs of abscess. No pneumoperitoneum identified. Musculoskeletal: No acute or significant osseous findings. IMPRESSION: 1. No acute findings within the abdomen or pelvis. 2. Postsurgical changes from total colectomy with right ventral abdominal wall ileostomy. No signs of bowel obstruction. 3. Residual thickening along the peritoneal reflections with mild persistent soft tissue stranding/edema within the mesenteric fat. No signs of abscess. 4. Hepatic steatosis. Electronically Signed   By: Waddell Calk M.D.   On: 04/22/2023 05:39     Discharge Exam: Vitals:   05/12/23 0451 05/12/23 0643  BP: 101/65   Pulse: (!) 101 86  Resp: 18   Temp: 98.8 F (37.1 C)   SpO2: 98%    Vitals:   05/11/23 2046 05/12/23 0300 05/12/23 0451 05/12/23 0643  BP: (!) 96/49  101/65   Pulse: 97  (!) 101 86  Resp: 16  18   Temp: 97.9 F (36.6  C)  98.8 F (37.1 C)   TempSrc: Oral     SpO2: 98%  98%   Weight:  54.6 kg    Height:        General: Pt is alert, awake, not in acute distress Cardiovascular: RRR, S1/S2 +, no rubs, no gallops Respiratory: CTA bilaterally, no wheezing, no rhonchi Abdominal: Soft, NT, ND, bowel sounds +, ostomy with soft stool present Extremities: no edema, no cyanosis    The results of significant diagnostics from this hospitalization (including imaging, microbiology, ancillary and laboratory) are listed below for reference.     Microbiology: Recent Results (from the past 240 hours)  Urine Culture     Status: Abnormal   Collection Time: 05/10/23  4:31 PM   Specimen: Urine, Clean Catch  Result Value Ref Range Status   Specimen Description   Final    URINE, CLEAN CATCH Performed at [facility], [address], [place], [place] [address]    Special Requests   Final    NONE Performed at [facility], [address],  [place], [place] [address]    Culture MULTIPLE SPECIES PRESENT, SUGGEST RECOLLECTION (A)  Final   Report Status 05/11/2023 FINAL  Final     Labs: BNP (last 3 results) Recent Labs    01/27/23 2352  BNP 558.2*   Basic Metabolic Panel: Recent Labs  Lab 05/10/23 1347 05/10/23 1734 05/11/23 0419 05/12/23 0413  NA 128*  --  129* 133*  K 3.6  --  2.7* 3.9  CL 83*  --  93* 102  CO2 29  --  26 25  GLUCOSE 111*  --  91 94  BUN 38*  --  25* 14  CREATININE 2.77*  --  1.38* 0.94  CALCIUM  9.5  --  8.6* 8.8*  MG  --  1.4* 2.3  --   PHOS  --  3.8  --   --    Liver Function Tests: No results for input(s): AST, ALT, ALKPHOS, BILITOT, PROT, ALBUMIN  in the last 168 hours. No results for input(s): LIPASE, AMYLASE in the last 168 hours. No results for input(s): AMMONIA in the last 168 hours. CBC: Recent Labs  Lab 05/10/23 1347 05/11/23 0419  WBC 14.4* 10.1  NEUTROABS 9.8*  --   HGB 11.6* 9.4*  HCT 34.5* 28.2*  MCV 82.7 83.2  PLT 812* 664*   Cardiac Enzymes: No results for input(s): CKTOTAL, CKMB, CKMBINDEX, TROPONINI in the last 168 hours. BNP: Invalid input(s): POCBNP CBG: No results for input(s): GLUCAP in the last 168 hours. D-Dimer No results for input(s): DDIMER in the last 72 hours. Hgb A1c No results for input(s): HGBA1C in the last 72 hours. Lipid Profile No results for input(s): CHOL, HDL, LDLCALC, TRIG, CHOLHDL, LDLDIRECT in the last 72 hours. Thyroid  function studies No results for input(s): TSH, T4TOTAL, T3FREE, THYROIDAB in the last 72 hours.  Invalid input(s): FREET3 Anemia work up No results for input(s): VITAMINB12, FOLATE, FERRITIN, TIBC, IRON, RETICCTPCT in the last 72 hours. Urinalysis    Component Value Date/Time   COLORURINE YELLOW 05/10/2023 1631   APPEARANCEUR HAZY (A) 05/10/2023 1631   LABSPEC 1.010 05/10/2023 1631   PHURINE 5.0 05/10/2023 1631   GLUCOSEU NEGATIVE 05/10/2023 1631    GLUCOSEU NEG mg/dL 91/75/7988 7682   HGBUR SMALL (A) 05/10/2023 1631   BILIRUBINUR NEGATIVE 05/10/2023 1631   KETONESUR NEGATIVE 05/10/2023 1631   PROTEINUR NEGATIVE 05/10/2023 1631   UROBILINOGEN 0.2 12/26/2013 1530   NITRITE NEGATIVE 05/10/2023 1631  LEUKOCYTESUR LARGE (A) 05/10/2023 1631   Sepsis Labs Recent Labs  Lab 05/10/23 1347 05/11/23 0419  WBC 14.4* 10.1   Microbiology Recent Results (from the past 240 hours)  Urine Culture     Status: Abnormal   Collection Time: 05/10/23  4:31 PM   Specimen: Urine, Clean Catch  Result Value Ref Range Status   Specimen Description   Final    URINE, CLEAN CATCH Performed at [facility], [address], [place], [place] [address]    Special Requests   Final    NONE Performed at [facility], [address], [place], [place] [address]    Culture MULTIPLE SPECIES PRESENT, SUGGEST RECOLLECTION (A)  Final   Report Status 05/11/2023 FINAL  Final     Time coordinating discharge: 35 minutes  SIGNED:   Adron JONETTA Fairly, DO [facility] 05/12/2023, 10:33 AM  If 7PM-7AM, please contact night-coverage [email or web address]

## 2023-05-12 NOTE — Progress Notes (Signed)
 Discharge paperwork explained. Patient understands. IV removed. Patient is going to shower & then call ride.

## 2023-05-12 NOTE — Plan of Care (Signed)
  Problem: Health Behavior/Discharge Planning: Goal: Ability to manage health-related needs will improve Outcome: Progressing   Problem: Clinical Measurements: Goal: Ability to maintain clinical measurements within normal limits will improve Outcome: Progressing Goal: Will remain free from infection Outcome: Progressing Goal: Diagnostic test results will improve Outcome: Progressing   Problem: Pain Management: Goal: General experience of comfort will improve Outcome: Progressing    Problem: Safety: Goal: Ability to remain free from injury will improve Outcome: Progressing

## 2023-05-12 NOTE — TOC Transition Note (Signed)
 Transition of Care [facility]) - Discharge Note   Patient Details  Name: [name] MRN: [id number] Date of Birth: November 06, 1970  Transition of Care [facility]) CM/SW Contact:  Lucie Lunger, LCSWA Phone Number: 05/12/2023, 11:43 AM   Clinical Narrative:    Pt is high risk for readmission and well known to TOC. Pt has Medicaid and she has been given information on transportation resources as well as information on how to order her supplies which are covered by her Medicaid. TOC signing off.   Final next level of care: Home/Self Care Barriers to Discharge: No Barriers Identified   Patient Goals and CMS Choice Patient states their goals for this hospitalization and ongoing recovery are:: return home          Discharge Placement                       Discharge Plan and Services Additional resources added to the After Visit Summary for   In-house Referral: Clinical Social Work                                   Social Drivers of Health (SDOH) Interventions SDOH Screenings   Food Insecurity: Food Insecurity Present (05/10/2023)  Housing: Patient Declined (05/10/2023)  Recent Concern: Housing - High Risk (04/22/2023)  Transportation Needs: Unmet Transportation Needs (05/10/2023)  Utilities: At Risk (05/10/2023)  Financial Resource Strain: Medium Risk (10/18/2022)   [facility]  Social Connections: Unknown (09/01/2021)   Received from [facility], [facility]  Stress: Stress Concern Present (06/25/2020)   Received from [facility], [facility]  Tobacco Use: High Risk (05/10/2023)     Readmission Risk Interventions    05/12/2023   11:42 AM 04/25/2023    9:13 AM 04/04/2023    1:36 PM  Readmission Risk Prevention Plan  Transportation Screening Complete Complete Complete  Medication Review (RN Care Manager)  Complete Complete  PCP or Specialist appointment within 3-5 days of discharge Complete Complete Not Complete  HRI or Home Care Consult Complete  Complete Not Complete  SW Recovery Care/Counseling Consult Complete Complete Not Complete  Palliative Care Screening Not Applicable Not Applicable Not Applicable  Skilled Nursing Facility Not Applicable Not Applicable Not Applicable

## 2023-06-10 NOTE — Discharge Summary (Signed)
 Hospitalist Discharge Summary   Discharge date:   June 10, 2023 Length of stay:    LOS: 2 days    Discharge Service:   [facility] Hospitalists Discharge Attending Physician: No att. providers found Discharge to:    To Home Condition at Discharge:  good   Mental Status On day of Discharge:  The patient is Alert and oriented to PERSON The patient is Alert And oriented to TIME The patient is Alert and oriented to [facility] course based on timeline of significant events after admission (by date):   ______________________________________   Admission HPI   Patient admitted on: 06/08/2023  1:11 PM  Patient admitted by: Brutus Franco Shank, FNP  CHIEF COMPLAINT: Increased ostomy output, watery with nausea and vomiting over the past several days   Day of admission HPI:   Patient notes that over the last week she has had nausea and vomiting with increased ostomy output, more watery than usual and different in color.  She ran out of bags at home and the skin around her ostomy is red and sore.  Was given scopolamine  patch for nausea by her PCP.  Smokes 1/2 pack/day cigarettes, no alcohol  use, currently lives with her best friend Burnard.  Has had her ostomy since October or November reportedly.   Patient admitted on Home O2? - yes Patient on home anticoagulant? -  yes Patient admitted with Chronic home foley catheter? - no Foley catheter placed or replaced by another service prior to admission? - no   Mental Status on Admission: The patient is Alert and oriented to PERSON The patient is Alert And oriented to TIME The patient is Alert and oriented to LOCATION   Problem List, Assessment & Plan    ASSESSMENT & PLAN (In order of descending acuity)  1.  Hyponatremia Resolved, up to 136 at this time   2.  AKI Creatinine 2.68 with BUN 41 Baseline 0.6-0.8 Resolved with IV fluids, creatinine at discharge 0.97   3.  Diarrhea/loose stools Pt with ileostomy, C. difficile  negative IVF to rehydrate, encourage fiber supplementation to help thicken/slow ostomy output   4.  Tobacco Abuse Nicotine  patch available if needed Smokes 1/2 ppd Counseled on cessation   5.  Ostomy Present Ostomy placement fall 2024 secondary to Crohn's Disease   6.  Atrial Fib On Metoprolol  for rate control HR with failure rate controlled, 90s to low 100s Continue Eliquis    7.  Anxiety disorder Continue Xanax  Calm and cooperative   8.  Seizure d/o  Continue Keppra    Anticoagulation; Eliquis  Oxygen ; none  ADDITIONAL PATIENT FINDINGS OR OBSERVATIONS  Patient's lying supine on hospital bed in no acute distress.SABRA  Physical Exam Constitutional:      Appearance: She is ill-appearing (Chronically ill-appearing, nontoxic).  HENT:     Head: Normocephalic and atraumatic.     Mouth/Throat:     Mouth: Mucous membranes are moist.     Pharynx: Oropharynx is clear. No oropharyngeal exudate.  Eyes:     General: No scleral icterus.    Pupils: Pupils are equal, round, and reactive to light.  Cardiovascular:     Rate and Rhythm: Normal rate and regular rhythm.     Pulses: Normal pulses.     Heart sounds: Normal heart sounds.  Pulmonary:     Effort: Pulmonary effort is normal.     Breath sounds: Normal breath sounds.  Abdominal:     General: Bowel sounds are normal.     Palpations: Abdomen is soft.  Tenderness: There is abdominal tenderness (Mild tenderness appreciated, no rebound or guarding). There is no guarding or rebound.     Comments: Liquidy ostomy output, dark brown  Musculoskeletal:        General: No swelling. Normal range of motion.     Right lower leg: No edema.     Left lower leg: No edema.  Skin:    General: Skin is warm and dry.  Neurological:     General: No focal deficit present.     Mental Status: She is alert and oriented to person, place, and time. Mental status is at baseline.  Psychiatric:        Mood and Affect: Mood normal.        Behavior:  Behavior normal.      DVT prophylaxis while in hospital:  on other anticoagulant (apixaban , rivaroxaban , dabigatran, prasugrel)   _____________________________________  Vital Signs: BP 98/67   Pulse 111   Temp 36.5 C (97.7 F) (Oral)   Resp 17   Ht 165.1 cm (5' 5)   Wt 54.1 kg (119 lb 4.8 oz)   SpO2 100%   BMI 19.85 kg/m   Nutrition:                                  Diet Instructions     Discharge diet (specify)     Discharge Nutrition Therapy: Heart Healthy                      CODE STATUS :                    Full Code   Patient discharged on Home O2? - no Patient discharged on home anticoagulant? -  yes Patient discharged with Chronic home foley catheter? - no  Time Spent on Discharge I spent greater than 30 minutes counseling and coordinating care for the discharge of this patient. The patient and I discussed the importance of outpatient follow-up as well as concerning signs and symptoms that would require immediate evaluation by a medical professional. The aforementioned conversation participants understand  and did show insight. I did use teachback to ensure understanding. The above participant/s is aware that not following the discussed plan, recommendations, and follow up can lead to severe negative effects on the patient's health, up to and including death.    Discharge Medications     Your Medication List     START taking these medications    HYDROmorphone  2 MG tablet Commonly known as: DILAUDID  Take 1 tablet (2 mg total) by mouth every six (6) hours as needed for moderate pain for up to 5 days.       CONTINUE taking these medications    ALPRAZolam  1 MG tablet Commonly known as: XANAX  TAKE (1) TABLET BY MOUTH THREE TIMES A DAY AS NEEDED FOR ANXIETY.   amitriptyline  100 MG tablet Commonly known as: ELAVIL  Take 1 tablet (100 mg total) by mouth nightly.   budesonide 3 mg 24 hr capsule Commonly known as: ENTOCORT EC Take 3 capsules  (9 mg total) by mouth in the morning.   cyclobenzaprine  10 MG tablet Commonly known as: FLEXERIL  Take 1 tablet (10 mg total) by mouth Three (3) times a day as needed.   cycloSPORINE  0.05 % ophthalmic emulsion Commonly known as: RESTASIS  Apply to eye.   DULoxetine  30 MG capsule Commonly known as: CYMBALTA  Take 1 capsule (30  mg total) by mouth daily.   gabapentin  300 MG capsule Commonly known as: NEURONTIN  Take 1 capsule (300 mg total) by mouth two (2) times a day.   levETIRAcetam  500 MG tablet Commonly known as: KEPPRA  Take 1.5 tablets (750 mg total) by mouth two (2) times a day.   levoFLOXacin  750 MG tablet Commonly known as: LEVAQUIN  Take 1 tablet (750 mg total) by mouth daily.   metoPROLOL  tartrate 25 MG tablet Commonly known as: Lopressor  Take 1 tablet (25 mg total) by mouth in the morning.   pantoprazole  40 MG tablet Commonly known as: Protonix  Take 1 tablet (40 mg total) by mouth daily.       ____________________________________________  Discharge Instructions   Nutrition:                                  Diet Instructions     Discharge diet (specify)     Discharge Nutrition Therapy: Heart Healthy       Activity:                                   Activity Instructions     Activity as tolerated         Appointments:                          Follow Up:                              Follow Up instructions and Outpatient Referrals    Call MD for:  difficulty breathing, headache or visual disturbances     Call MD for:  extreme fatigue     Call MD for:  persistent dizziness or light-headedness     Call MD for:  persistent nausea or vomiting     Call MD for:  severe uncontrolled pain     Call MD for: Temperature > 38.5 Celsius ( > 101.3 Fahrenheit)     Discharge instructions         Allergies   Allergies  Allergen Reactions  . Codeine Itching, Nausea And Vomiting, Rash, Shortness Of Breath and Swelling    Throat swelling  . Hydrocodone  Rash,  Shortness Of Breath and Swelling  . Ketorolac  (Pf) Nausea And Vomiting and Swelling    Pt reports n/v and swelling to throat  . Morphine Rash, Shortness Of Breath and Swelling  . Oxycodone  Other (See Comments), Rash, Shortness Of Breath and Swelling    Patient states ALL pain medications make her deathly sick.  Swelling, rash  . Penicillins Itching, Other (See Comments), Shortness Of Breath and Swelling    Has patient had a PCN reaction causing immediate rash, facial/tongue/throat swelling, SOB or lightheadedness with hypotension: Yes  Has patient had a PCN reaction causing severe rash involving mucus membranes or skin necrosis: Yes  Has patient had a PCN reaction that required hospitalization Yes  Has patient had a PCN reaction occurring within the last 10 years: No  If all of the above answers are NO, then may proceed with Cephalosporin use.      Throat swells    02/06/21--TOLERATES CEFTRIAXONE   . Strawberry Extract Swelling  . Watermelon Swelling  . Latex Rash  . Acetaminophen      Per MD patient states she can't  take Tylenol  because of liver enzymes  . Lorazepam  Other (See Comments)    Migraines.  . Pregabalin  Other (See Comments)  . Albuterol  Palpitations  . Diphenhydramine  Hcl Palpitations  . Infliximab  Rash    Blisters and Welts      Past Medical History   Past Medical History:  Diagnosis Date  . Crohn's disease (CMS-HCC)   . H/O degenerative disc disease   . PTSD (post-traumatic stress disorder)        Lab Results   Recent Labs    Units 06/08/23 1424 06/09/23 0332 06/10/23 0554  WBC 10*9/L 10.8* 6.4 7.0  HGB g/dL 86.5 87.9 89.5*  HCT % 38.8 36.4 31.2*  PLT 10*9/L 458* 338 475*   Recent Labs    Units 06/08/23 1424 06/08/23 1612 06/09/23 0332 06/10/23 0554  NA mmol/L 127*  --  133* 136  K mmol/L 3.6  --  3.8 3.4*  CL mmol/L 85*  --  95* 99  CO2 mmol/L 29.0  --  27.1 26.0  BUN mg/dL 41*  --  27* 11  CREATININE mg/dL 7.31*  --  8.40* 9.02   GLU mg/dL 892  --  88 81  CALCIUM  mg/dL 9.5  --  9.1 9.2  ALBUMIN  g/dL 3.7  --  3.1*  --   PROT g/dL 9.5*  --  7.9  --   BILITOT mg/dL 0.5  --  0.4  --   AST U/L 33  --  27  --   ALT U/L 37  --  26  --   ALKPHOS U/L 238*  --  201*  --   MG mg/dL 1.1*  --  2.3  --   LIPASE U/L 61  --   --   --   LACTATE mmol/L 2.1* 1.2  --   --    No results for input(s): CKTOTAL, TROPONINI, TROPONINT, CKMB, EDTPNI, BNP, CHOL, LDL, HDL, TRIG in the last 168 hours. No results for input(s): INR, LABPROT, APTT, DDIMER in the last 168 hours. No results for input(s): TSH, ETOH, ACETAMIN, SALICYLATE, FREET3, T4FREE, A1C, TTR, ESR, CRP, HSCRP, ANA, RF, VITAMINB12, FOLATE, IRON, LABIRON, TIBC, FERRITIN, RETIC, RETICCTPCT, C3, C4, LDH, HAPTM, URICACID, HEPP4, CEA, RAPSCRN, CDIFRPCR, CDIFFNAP1, HIV12SCRN, HIVCP, TBCELLQN in the last 168 hours.  Invalid input(s): HIVSCRN  No results for input(s): HEPAIGM, HEPBSAG, HEPBIGM, HEPCAB, MITOAB in the last 168 hours. Recent Labs    Units 06/08/23 1754  WBCUA /HPF 1  NITRITE  Negative  LEUKOCYTESUR  Negative  BACTERIA /HPF Occasional  RBCUA /HPF 0  BLOODU  Negative  GLUCOSEU  Negative  PROTEINUA  Negative  KETONESU  Negative   No results for input(s): KETUR, PREGTESTUR, PREGPOC, NAURINE, LABOSMO, OSMOFT, PROTEINUR, CREATUR, PCRATIOUR, OPIAU, BENZU, TRICYCLIC, PCPU, AMPHU, COCAU, CANNAU, BARBU, URPROTELEC in the last 168 hours. No results for input(s): FTYP1, WBCFLUID, FNEUT, LYMPHSFL, FMONO, EOSFL, RBCFL, CLARITYFLUID, COLORFL in the last 168 hours. No results for input(s): O2SOUR, FIO2ART, PHART, PCO2ART, PO2ART, HCO3ART, O2SATART, BEART in the last 72 hours.  Imaging  XR Abdomen 1 View Result Date: 06/10/2023 CLINICAL DATA:  Left-sided abdominal pain. EXAM: ABDOMEN - 1 VIEW COMPARISON:   February 13, 2023. FINDINGS: The bowel gas pattern is normal. Ostomy seen in right lower quadrant. Status post cholecystectomy..   No abnormal bowel dilatation. Electronically Signed   By: Lynwood Landy Raddle M.D.   On: 06/10/2023 12:49   CT Abdomen Pelvis Wo Contrast Result Date: 06/08/2023 CLINICAL DATA:  Abdominal pain.  History of colectomy  and ostomy. EXAM: CT ABDOMEN AND PELVIS WITHOUT CONTRAST TECHNIQUE: Multidetector CT imaging of the abdomen and pelvis was performed following the standard protocol without IV contrast. RADIATION DOSE REDUCTION: This exam was performed according to the departmental dose-optimization program which includes automated exposure control, adjustment of the mA and/or kV according to patient size and/or use of iterative reconstruction technique. COMPARISON:  CT abdomen pelvis dated 04/22/2023. FINDINGS: Evaluation of this exam is limited in the absence of intravenous contrast. Lower chest: The visualized lung bases are clear. No intra-abdominal free air or free fluid. Hepatobiliary: The liver is unremarkable. No biliary dilatation. Cholecystectomy. No retained calcified stone noted in the central CBD. Pancreas: Unremarkable. No pancreatic ductal dilatation or surrounding inflammatory changes. Spleen: Normal in size without focal abnormality. Adrenals/Urinary Tract: The adrenal glands are unremarkable. The kidneys, visualized ureters, and urinary unremarkable. Stomach/Bowel: Postsurgical changes of colectomy with a right anterior ostomy. There is no bowel obstruction or active inflammation. Appendectomy. Vascular/Lymphatic: The abdominal aorta and IVC are grossly unremarkable on this noncontrast CT. No portal venous gas. There is no adenopathy. Reproductive: Hysterectomy.  No suspicious adnexal masses. Other: There is abutment of loops of small bowel to the anterior peritoneal wall in the midline suggestive of adhesions. Musculoskeletal: No acute or significant osseous findings.   1.  No acute intra-abdominal or pelvic pathology. 2. Postsurgical changes of colectomy with a right anterior ostomy. No bowel obstruction. Electronically Signed   By: Vanetta Chou M.D.   On: 06/08/2023 16:14    Margart Dragon, DO Hospitalist, [facility] 06/10/23, 6:03 PM

## 2023-06-19 ENCOUNTER — Other Ambulatory Visit: Payer: Self-pay

## 2023-06-19 ENCOUNTER — Inpatient Hospital Stay (HOSPITAL_COMMUNITY)
Admission: EM | Admit: 2023-06-19 | Discharge: 2023-06-28 | DRG: 871 | Disposition: A | Discharge status: Home Health | Payer: Medicaid Other | Attending: Internal Medicine | Admitting: Internal Medicine

## 2023-06-19 ENCOUNTER — Emergency Department (HOSPITAL_COMMUNITY): Payer: Medicaid Other

## 2023-06-19 ENCOUNTER — Encounter (HOSPITAL_COMMUNITY): Payer: Self-pay | Admitting: Emergency Medicine

## 2023-06-19 DIAGNOSIS — G928 Other toxic encephalopathy: Secondary | ICD-10-CM | POA: Diagnosis present

## 2023-06-19 DIAGNOSIS — A419 Sepsis, unspecified organism: Secondary | ICD-10-CM | POA: Diagnosis present

## 2023-06-19 DIAGNOSIS — J449 Chronic obstructive pulmonary disease, unspecified: Secondary | ICD-10-CM | POA: Diagnosis not present

## 2023-06-19 DIAGNOSIS — K9409 Other complications of colostomy: Secondary | ICD-10-CM | POA: Diagnosis not present

## 2023-06-19 DIAGNOSIS — D75839 Thrombocytosis, unspecified: Secondary | ICD-10-CM | POA: Diagnosis present

## 2023-06-19 DIAGNOSIS — E860 Dehydration: Secondary | ICD-10-CM | POA: Diagnosis present

## 2023-06-19 DIAGNOSIS — E785 Hyperlipidemia, unspecified: Secondary | ICD-10-CM | POA: Diagnosis present

## 2023-06-19 DIAGNOSIS — Z8619 Personal history of other infectious and parasitic diseases: Secondary | ICD-10-CM | POA: Diagnosis not present

## 2023-06-19 DIAGNOSIS — R109 Unspecified abdominal pain: Secondary | ICD-10-CM | POA: Diagnosis not present

## 2023-06-19 DIAGNOSIS — E876 Hypokalemia: Secondary | ICD-10-CM | POA: Diagnosis present

## 2023-06-19 DIAGNOSIS — R579 Shock, unspecified: Secondary | ICD-10-CM | POA: Diagnosis not present

## 2023-06-19 DIAGNOSIS — Z8719 Personal history of other diseases of the digestive system: Secondary | ICD-10-CM | POA: Diagnosis not present

## 2023-06-19 DIAGNOSIS — R680 Hypothermia, not associated with low environmental temperature: Secondary | ICD-10-CM | POA: Diagnosis not present

## 2023-06-19 DIAGNOSIS — G4700 Insomnia, unspecified: Secondary | ICD-10-CM | POA: Diagnosis present

## 2023-06-19 DIAGNOSIS — Z604 Social exclusion and rejection: Secondary | ICD-10-CM | POA: Diagnosis present

## 2023-06-19 DIAGNOSIS — K264 Chronic or unspecified duodenal ulcer with hemorrhage: Secondary | ICD-10-CM | POA: Diagnosis present

## 2023-06-19 DIAGNOSIS — F1310 Sedative, hypnotic or anxiolytic abuse, uncomplicated: Secondary | ICD-10-CM | POA: Diagnosis present

## 2023-06-19 DIAGNOSIS — Y92009 Unspecified place in unspecified non-institutional (private) residence as the place of occurrence of the external cause: Secondary | ICD-10-CM | POA: Diagnosis not present

## 2023-06-19 DIAGNOSIS — Z91018 Allergy to other foods: Secondary | ICD-10-CM

## 2023-06-19 DIAGNOSIS — E119 Type 2 diabetes mellitus without complications: Secondary | ICD-10-CM | POA: Diagnosis present

## 2023-06-19 DIAGNOSIS — F4310 Post-traumatic stress disorder, unspecified: Secondary | ICD-10-CM | POA: Diagnosis present

## 2023-06-19 DIAGNOSIS — Z833 Family history of diabetes mellitus: Secondary | ICD-10-CM

## 2023-06-19 DIAGNOSIS — F329 Major depressive disorder, single episode, unspecified: Secondary | ICD-10-CM | POA: Diagnosis present

## 2023-06-19 DIAGNOSIS — Z86718 Personal history of other venous thrombosis and embolism: Secondary | ICD-10-CM | POA: Diagnosis not present

## 2023-06-19 DIAGNOSIS — Z9889 Other specified postprocedural states: Secondary | ICD-10-CM

## 2023-06-19 DIAGNOSIS — Z5986 Financial insecurity: Secondary | ICD-10-CM

## 2023-06-19 DIAGNOSIS — E871 Hypo-osmolality and hyponatremia: Secondary | ICD-10-CM | POA: Diagnosis present

## 2023-06-19 DIAGNOSIS — J4489 Other specified chronic obstructive pulmonary disease: Secondary | ICD-10-CM | POA: Diagnosis present

## 2023-06-19 DIAGNOSIS — K219 Gastro-esophageal reflux disease without esophagitis: Secondary | ICD-10-CM | POA: Diagnosis not present

## 2023-06-19 DIAGNOSIS — Z90721 Acquired absence of ovaries, unilateral: Secondary | ICD-10-CM

## 2023-06-19 DIAGNOSIS — K2970 Gastritis, unspecified, without bleeding: Secondary | ICD-10-CM | POA: Diagnosis present

## 2023-06-19 DIAGNOSIS — K529 Noninfective gastroenteritis and colitis, unspecified: Secondary | ICD-10-CM | POA: Diagnosis present

## 2023-06-19 DIAGNOSIS — I4719 Other supraventricular tachycardia: Secondary | ICD-10-CM | POA: Diagnosis present

## 2023-06-19 DIAGNOSIS — T424X1A Poisoning by benzodiazepines, accidental (unintentional), initial encounter: Secondary | ICD-10-CM | POA: Diagnosis not present

## 2023-06-19 DIAGNOSIS — F1110 Opioid abuse, uncomplicated: Secondary | ICD-10-CM | POA: Diagnosis present

## 2023-06-19 DIAGNOSIS — Z9049 Acquired absence of other specified parts of digestive tract: Secondary | ICD-10-CM

## 2023-06-19 DIAGNOSIS — D638 Anemia in other chronic diseases classified elsewhere: Secondary | ICD-10-CM | POA: Diagnosis present

## 2023-06-19 DIAGNOSIS — R571 Hypovolemic shock: Principal | ICD-10-CM | POA: Diagnosis present

## 2023-06-19 DIAGNOSIS — T40601A Poisoning by unspecified narcotics, accidental (unintentional), initial encounter: Secondary | ICD-10-CM | POA: Diagnosis present

## 2023-06-19 DIAGNOSIS — Z9079 Acquired absence of other genital organ(s): Secondary | ICD-10-CM

## 2023-06-19 DIAGNOSIS — I959 Hypotension, unspecified: Secondary | ICD-10-CM | POA: Diagnosis not present

## 2023-06-19 DIAGNOSIS — Z90710 Acquired absence of both cervix and uterus: Secondary | ICD-10-CM

## 2023-06-19 DIAGNOSIS — D649 Anemia, unspecified: Secondary | ICD-10-CM | POA: Diagnosis not present

## 2023-06-19 DIAGNOSIS — I10 Essential (primary) hypertension: Secondary | ICD-10-CM | POA: Diagnosis present

## 2023-06-19 DIAGNOSIS — Z5941 Food insecurity: Secondary | ICD-10-CM

## 2023-06-19 DIAGNOSIS — E861 Hypovolemia: Secondary | ICD-10-CM | POA: Diagnosis present

## 2023-06-19 DIAGNOSIS — Z885 Allergy status to narcotic agent status: Secondary | ICD-10-CM

## 2023-06-19 DIAGNOSIS — R6521 Severe sepsis with septic shock: Secondary | ICD-10-CM | POA: Diagnosis not present

## 2023-06-19 DIAGNOSIS — Z5982 Transportation insecurity: Secondary | ICD-10-CM

## 2023-06-19 DIAGNOSIS — T424X1S Poisoning by benzodiazepines, accidental (unintentional), sequela: Secondary | ICD-10-CM | POA: Diagnosis not present

## 2023-06-19 DIAGNOSIS — Z79899 Other long term (current) drug therapy: Secondary | ICD-10-CM

## 2023-06-19 DIAGNOSIS — G40909 Epilepsy, unspecified, not intractable, without status epilepticus: Secondary | ICD-10-CM | POA: Diagnosis present

## 2023-06-19 DIAGNOSIS — I951 Orthostatic hypotension: Secondary | ICD-10-CM | POA: Diagnosis present

## 2023-06-19 DIAGNOSIS — Z880 Allergy status to penicillin: Secondary | ICD-10-CM

## 2023-06-19 DIAGNOSIS — D519 Vitamin B12 deficiency anemia, unspecified: Secondary | ICD-10-CM | POA: Diagnosis present

## 2023-06-19 DIAGNOSIS — Z888 Allergy status to other drugs, medicaments and biological substances status: Secondary | ICD-10-CM

## 2023-06-19 DIAGNOSIS — R1310 Dysphagia, unspecified: Secondary | ICD-10-CM | POA: Diagnosis present

## 2023-06-19 DIAGNOSIS — Z8249 Family history of ischemic heart disease and other diseases of the circulatory system: Secondary | ICD-10-CM

## 2023-06-19 DIAGNOSIS — K9403 Colostomy malfunction: Secondary | ICD-10-CM | POA: Diagnosis present

## 2023-06-19 DIAGNOSIS — I700 Atherosclerosis of aorta: Secondary | ICD-10-CM | POA: Diagnosis present

## 2023-06-19 DIAGNOSIS — N179 Acute kidney failure, unspecified: Secondary | ICD-10-CM

## 2023-06-19 DIAGNOSIS — K3189 Other diseases of stomach and duodenum: Secondary | ICD-10-CM | POA: Diagnosis not present

## 2023-06-19 DIAGNOSIS — G894 Chronic pain syndrome: Secondary | ICD-10-CM | POA: Diagnosis not present

## 2023-06-19 DIAGNOSIS — K2971 Gastritis, unspecified, with bleeding: Secondary | ICD-10-CM | POA: Diagnosis not present

## 2023-06-19 DIAGNOSIS — Z87828 Personal history of other (healed) physical injury and trauma: Secondary | ICD-10-CM

## 2023-06-19 DIAGNOSIS — F411 Generalized anxiety disorder: Secondary | ICD-10-CM | POA: Diagnosis present

## 2023-06-19 DIAGNOSIS — Z91040 Latex allergy status: Secondary | ICD-10-CM

## 2023-06-19 DIAGNOSIS — E538 Deficiency of other specified B group vitamins: Secondary | ICD-10-CM | POA: Diagnosis not present

## 2023-06-19 DIAGNOSIS — Z7901 Long term (current) use of anticoagulants: Secondary | ICD-10-CM

## 2023-06-19 DIAGNOSIS — F17210 Nicotine dependence, cigarettes, uncomplicated: Secondary | ICD-10-CM | POA: Diagnosis present

## 2023-06-19 LAB — PROTIME-INR
INR: 1 (ref 0.8–1.2)
Prothrombin Time: 13.1 s (ref 11.4–15.2)

## 2023-06-19 LAB — COMPREHENSIVE METABOLIC PANEL
ALT: 15 U/L (ref 0–44)
AST: 24 U/L (ref 15–41)
Albumin: 3.3 g/dL — ABNORMAL LOW (ref 3.5–5.0)
Alkaline Phosphatase: 95 U/L (ref 38–126)
Anion gap: 11 (ref 5–15)
BUN: 14 mg/dL (ref 6–20)
CO2: 22 mmol/L (ref 22–32)
Calcium: 8.8 mg/dL — ABNORMAL LOW (ref 8.9–10.3)
Chloride: 99 mmol/L (ref 98–111)
Creatinine, Ser: 1.55 mg/dL — ABNORMAL HIGH (ref 0.44–1.00)
GFR, Estimated: 40 mL/min — ABNORMAL LOW (ref >60)
Glucose, Bld: 89 mg/dL (ref 70–99)
Potassium: 3.9 mmol/L (ref 3.5–5.1)
Sodium: 132 mmol/L — ABNORMAL LOW (ref 135–145)
Total Bilirubin: 0.3 mg/dL (ref 0.0–1.2)
Total Protein: 6.5 g/dL (ref 6.5–8.1)

## 2023-06-19 LAB — URINALYSIS, W/ REFLEX TO CULTURE (INFECTION SUSPECTED)
Bacteria, UA: NONE SEEN
Bilirubin Urine: NEGATIVE
Glucose, UA: NEGATIVE mg/dL
Hgb urine dipstick: NEGATIVE
Ketones, ur: NEGATIVE mg/dL
Leukocytes,Ua: NEGATIVE
Nitrite: NEGATIVE
Protein, ur: NEGATIVE mg/dL
Specific Gravity, Urine: 1.016 (ref 1.005–1.030)
pH: 5 (ref 5.0–8.0)

## 2023-06-19 LAB — CBC
HCT: 30.4 % — ABNORMAL LOW (ref 36.0–46.0)
HCT: 33.1 % — ABNORMAL LOW (ref 36.0–46.0)
Hemoglobin: 10.2 g/dL — ABNORMAL LOW (ref 12.0–15.0)
Hemoglobin: 10.9 g/dL — ABNORMAL LOW (ref 12.0–15.0)
MCH: 28.4 pg (ref 26.0–34.0)
MCH: 27.8 pg (ref 26.0–34.0)
MCHC: 33.6 g/dL (ref 30.0–36.0)
MCHC: 32.9 g/dL (ref 30.0–36.0)
MCV: 84.7 fL (ref 80.0–100.0)
MCV: 84.4 fL (ref 80.0–100.0)
Platelets: 586 10*3/uL — ABNORMAL HIGH (ref 150–400)
Platelets: 666 10*3/uL — ABNORMAL HIGH (ref 150–400)
RBC: 3.59 MIL/uL — ABNORMAL LOW (ref 3.87–5.11)
RBC: 3.92 MIL/uL (ref 3.87–5.11)
RDW: 16.4 % — ABNORMAL HIGH (ref 11.5–15.5)
RDW: 16.6 % — ABNORMAL HIGH (ref 11.5–15.5)
WBC: 12.4 10*3/uL — ABNORMAL HIGH (ref 4.0–10.5)
WBC: 10.9 10*3/uL — ABNORMAL HIGH (ref 4.0–10.5)
nRBC: 0 % (ref 0.0–0.2)
nRBC: 0 % (ref 0.0–0.2)

## 2023-06-19 LAB — CBG MONITORING, ED: Glucose-Capillary: 70 mg/dL (ref 70–99)

## 2023-06-19 LAB — GLUCOSE, CAPILLARY
Glucose-Capillary: 94 mg/dL (ref 70–99)
Glucose-Capillary: 90 mg/dL (ref 70–99)
Glucose-Capillary: 95 mg/dL (ref 70–99)

## 2023-06-19 LAB — RAPID URINE DRUG SCREEN, HOSP PERFORMED
Amphetamines: NOT DETECTED
Barbiturates: NOT DETECTED
Benzodiazepines: POSITIVE — AB
Cocaine: NOT DETECTED
Opiates: NOT DETECTED
Tetrahydrocannabinol: NOT DETECTED

## 2023-06-19 LAB — LACTIC ACID, PLASMA
Lactic Acid, Venous: 1.5 mmol/L (ref 0.5–1.9)
Lactic Acid, Venous: 2.5 mmol/L (ref 0.5–1.9)
Lactic Acid, Venous: 1.1 mmol/L (ref 0.5–1.9)

## 2023-06-19 LAB — MRSA NEXT GEN BY PCR, NASAL: MRSA by PCR Next Gen: NOT DETECTED

## 2023-06-19 LAB — SALICYLATE LEVEL: Salicylate Lvl: <7 mg/dL — ABNORMAL LOW (ref 7.0–30.0)

## 2023-06-19 LAB — ACETAMINOPHEN LEVEL: Acetaminophen (Tylenol), Serum: <10 ug/mL — ABNORMAL LOW (ref 10–30)

## 2023-06-19 LAB — ETHANOL: Alcohol, Ethyl (B): <10 mg/dL (ref <10)

## 2023-06-19 LAB — APTT: aPTT: 34 s (ref 24–36)

## 2023-06-19 LAB — PROCALCITONIN: Procalcitonin: <0.1 ng/mL

## 2023-06-19 LAB — LIPASE, BLOOD: Lipase: 32 U/L (ref 11–51)

## 2023-06-19 MED ORDER — CHLORHEXIDINE GLUCONATE CLOTH 2 % EX PADS
6.0000 | MEDICATED_PAD | Freq: Every day | CUTANEOUS | Status: DC
  Administered 2023-06-19 – 2023-06-26 (×8): 6 via TOPICAL

## 2023-06-19 MED ORDER — PHENTOLAMINE MESYLATE 5 MG IJ SOLR
5.0000 mg | Freq: Once | INTRAMUSCULAR | Status: AC
  Administered 2023-06-19 (×2): 5 mg via SUBCUTANEOUS
  Filled 2023-06-19 (×2): qty 5

## 2023-06-19 MED ORDER — LACTATED RINGERS IV BOLUS
1000.0000 mL | Freq: Once | INTRAVENOUS | Status: AC
  Administered 2023-06-19: 1000 mL via INTRAVENOUS

## 2023-06-19 MED ORDER — SODIUM CHLORIDE 0.9 % IV BOLUS
1000.0000 mL | Freq: Once | INTRAVENOUS | Status: AC
  Administered 2023-06-19: 1000 mL via INTRAVENOUS

## 2023-06-19 MED ORDER — LACTATED RINGERS IV SOLN: INTRAVENOUS | Status: DC

## 2023-06-19 MED ORDER — IOHEXOL 300 MG/ML  SOLN
80.0000 mL | Freq: Once | INTRAMUSCULAR | Status: AC | PRN
  Administered 2023-06-19: 80 mL via INTRAVENOUS

## 2023-06-19 MED ORDER — HEPARIN SODIUM (PORCINE) 5000 UNIT/ML IJ SOLN
5000.0000 [IU] | Freq: Three times a day (TID) | INTRAMUSCULAR | Status: DC
  Administered 2023-06-19 – 2023-06-22 (×9): 5000 [IU] via SUBCUTANEOUS
  Filled 2023-06-19 (×9): qty 1

## 2023-06-19 MED ORDER — SODIUM CHLORIDE 0.9 % IV SOLN
250.0000 mL | INTRAVENOUS | Status: DC
Start: 2023-06-19 — End: 2023-06-20
  Administered 2023-06-19: 250 mL via INTRAVENOUS

## 2023-06-19 MED ORDER — NOREPINEPHRINE 4 MG/250ML-% IV SOLN
0.0000 ug/min | INTRAVENOUS | Status: DC
  Administered 2023-06-19: 2 ug/min via INTRAVENOUS
  Filled 2023-06-19: qty 250

## 2023-06-19 MED ORDER — DOCUSATE SODIUM 100 MG PO CAPS: 100.0000 mg | ORAL_CAPSULE | Freq: Two times a day (BID) | ORAL | Status: DC | PRN

## 2023-06-19 MED ORDER — ENOXAPARIN SODIUM 40 MG/0.4ML IJ SOSY: 40.0000 mg | PREFILLED_SYRINGE | INTRAMUSCULAR | Status: DC

## 2023-06-19 MED ORDER — POLYETHYLENE GLYCOL 3350 17 G PO PACK: 17.0000 g | PACK | Freq: Every day | ORAL | Status: DC | PRN

## 2023-06-19 MED ORDER — VANCOMYCIN HCL IN DEXTROSE 1-5 GM/200ML-% IV SOLN
1000.0000 mg | Freq: Once | INTRAVENOUS | Status: AC
  Administered 2023-06-19: 1000 mg via INTRAVENOUS
  Filled 2023-06-19: qty 200

## 2023-06-19 MED ORDER — SODIUM CHLORIDE 0.9 % IV SOLN
2.0000 g | Freq: Once | INTRAVENOUS | Status: AC
  Administered 2023-06-19: 2 g via INTRAVENOUS
  Filled 2023-06-19: qty 20

## 2023-06-19 MED ORDER — NITROGLYCERIN 2 % TD OINT
1.0000 [in_us] | TOPICAL_OINTMENT | Freq: Three times a day (TID) | TRANSDERMAL | Status: AC
  Administered 2023-06-19 – 2023-06-21 (×6): 1 [in_us] via TOPICAL
  Filled 2023-06-19: qty 30

## 2023-06-19 MED ORDER — SODIUM CHLORIDE 0.9 % IV SOLN
750.0000 mg | Freq: Two times a day (BID) | INTRAVENOUS | Status: DC
  Administered 2023-06-19 (×2): 750 mg via INTRAVENOUS
  Filled 2023-06-19 (×4): qty 7.5

## 2023-06-19 MED ORDER — NOREPINEPHRINE 4 MG/250ML-% IV SOLN
2.0000 ug/min | INTRAVENOUS | Status: DC
Start: 2023-06-19 — End: 2023-06-20
  Administered 2023-06-19: 2 ug/min via INTRAVENOUS
  Administered 2023-06-19: 4 ug/min via INTRAVENOUS
  Filled 2023-06-19: qty 250

## 2023-06-19 NOTE — ED Notes (Signed)
[facility] called for transport @ [phone]

## 2023-06-19 NOTE — Progress Notes (Signed)
Brief PCCM progress note  Notified by nursing that patient's daughter Janan Ridge requested a call from provider to discuss patient's clinical status.  I called and spoke to [facility], she relates that patient is unable to safely care for herself at home.  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  Amber states this has been a longstanding issue and this is now resulted in limited to no options for discharge as the friend patient had been staying with is now refusing for patient to be discharged back to his home.   PMP aware database reviewed and it appears patient utilizes: -Alprazolam 1 mg tabs, last filled 90 tabs on 05/19/2023 -Hydromorphone 2 mg tabs, last fill 6 tabs 06/14/23 on followed by 5 tabs filled 06/10/23 -Triazolam 0.25 mg tabs, last fill 30 tabs 05/31/23  At this time patient remains too somnolent to hold discuss regarding underlying pain and anxiety. All home medications on hold.   Leiani Enright D. Harris, NP-C [facility] Personal contact information can be found on Amion  If no contact or response made please call [phone] 06/19/2023, 1:50 PM

## 2023-06-19 NOTE — Progress Notes (Signed)
ED Pharmacy Antibiotic Sign Off An antibiotic consult was received from an ED provider for vancomycin per pharmacy dosing for sepsis. A chart review was completed to assess appropriateness.   The following one time order(s) were placed:  Vancomycin 1000mg  IV x1  Further antibiotic and/or antibiotic pharmacy consults should be ordered by the admitting provider if indicated.   Thank you for allowing pharmacy to be a part of this patient's care.   Vernard Gambles, PharmD, BCPS   Clinical Pharmacist 06/19/23 2:30 AM

## 2023-06-19 NOTE — ED Triage Notes (Addendum)
Pt bib EMS after they received a call from caregiver that stated pt's colostomy bag was "leaking". EMS states colostomy bag was off upon their arrival and site was very red. EMS also states pt was lethargic and caregiver states that pt has hx of dementia and "takes pain meds all the time and forgets how much she takes". Caregiver also reports frequent falls. Pt has a skin tear to L forearm upon arrival. EMS administered 2mg  Narcan on scene and stated that "there wasn't much change". Pt responds to voice currently but speech very slurred. EMS also stated pt was hypotensive at 84/57 and that IV attempts were unsuccessful. CBG reported to be 151.

## 2023-06-19 NOTE — Plan of Care (Signed)
  Problem: Pain Managment: Goal: General experience of comfort will improve and/or be controlled Outcome: Progressing   Problem: Safety: Goal: Ability to remain free from injury will improve Outcome: Progressing   Problem: Skin Integrity: Goal: Risk for impaired skin integrity will decrease Outcome: Progressing

## 2023-06-19 NOTE — ED Notes (Addendum)
ED TO INPATIENT HANDOFF REPORT  ED Nurse Name and Phone #: Beverley Fiedler Name/Age/Gender [name] 53 y.o. female Room/Bed: APOTF/OTF  Code Status   Code Status: Prior  Home/SNF/Other Home Patient oriented to: self Is this baseline? No   Triage Complete: Triage complete  Chief Complaint Sepsis [facility]) [A41.9]  Triage Note Pt bib EMS after they received a call from caregiver that stated pt's colostomy bag was "leaking". EMS states colostomy bag was off upon their arrival and site was very red. EMS also states pt was lethargic and caregiver states that pt has hx of dementia and "takes pain meds all the time and forgets how much she takes". Caregiver also reports frequent falls. Pt has a skin tear to L forearm upon arrival. EMS administered 2mg  Narcan on scene and stated that "there wasn't much change". Pt responds to voice currently but speech very slurred. EMS also stated pt was hypotensive at 84/57 and that IV attempts were unsuccessful. CBG reported to be 151.    Allergies Allergies  Allergen Reactions   Codeine Shortness Of Breath, Swelling and Rash    Throat swelling   Hydrocodone Shortness Of Breath, Swelling and Rash   Ketorolac Nausea And Vomiting, Swelling and Nausea Only    Pt reports n/v and swelling to throat   Morphine And Codeine Shortness Of Breath, Swelling and Rash   Penicillins Shortness Of Breath, Swelling and Other (See Comments)    Throat swells 02/06/21--TOLERATES CEFTRIAXONE     Nickel Rash   Pregabalin Other (See Comments)   Acetaminophen     Per MD patient states she can't take Tylenol because of liver enzymes   Atarax [Hydroxyzine] Itching and Swelling    Mildly swollen throat   Ativan [Lorazepam] Other (See Comments)    Migraines.   Oxycodone-Acetaminophen Nausea Only and Nausea And Vomiting   Strawberry Extract Swelling   Watermelon Concentrate Swelling   Albuterol Palpitations   Benadryl [Diphenhydramine Hcl] Palpitations    Humira [Adalimumab] Rash   Latex Rash   Remicade [Infliximab] Rash    Blisters and Welts    Tramadol Rash    Rash and itching    Level of Care/Admitting Diagnosis ED Disposition     ED Disposition  Admit   Condition  --   Comment  Hospital Area: [facility] [[id number]]  Level of Care: ICU [6]  May [facility] to [name] or [facility] if equivalent level of care is available:: No  Interfacility transfer: Yes  Covid Evaluation: Asymptomatic - no recent exposure (last 10 days) testing not required  Diagnosis: Sepsis [facility]) [[id number]]  Admitting Physician: Patrici Ranks [[id number]]  Attending Physician: Patrici Ranks [[id number]]  Certification:: I certify this patient will need inpatient services for at least 2 midnights  Expected Medical Readiness: 06/21/2023          B Medical/Surgery History 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   GERD (gastroesophageal reflux disease)    EGD 01/2007 by Dr.Rourke small hiatal hernia s/p 56 french maloney    History of head injury    Hypertension    IBS (irritable bowel syndrome)    PSVT (paroxysmal supraventricular tachycardia) (HCC)    PTSD (post-traumatic stress disorder)  Recurrent chest pain    Seizure disorder (HCC)    Type 2 diabetes mellitus (HCC)    Past Surgical History:  Procedure Laterality Date   ABDOMINAL HYSTERECTOMY     with right salpingo oophorectomy 2005   APPENDECTOMY  2006   BALLOON DILATION N/A 02/19/2021   Procedure: BALLOON DILATION;  Surgeon: Lanelle Bal, DO;  [facility];  Service: Endoscopy;  Laterality: N/A;  Sigmoid colon stricture   BIOPSY  02/06/2021   Procedure: BIOPSY;  Surgeon: Lanelle Bal, DO;  [facility];  Service: Endoscopy;;   BIOPSY   02/19/2021   Procedure: BIOPSY;  Surgeon: Lanelle Bal, DO;  [facility];  Service: Endoscopy;;   BIOPSY  07/15/2021   Procedure: BIOPSY;  Surgeon: Malissa Hippo, MD;  [facility];  Service: Endoscopy;;   BIOPSY  04/08/2023   Procedure: BIOPSY;  Surgeon: Corbin Ade, MD;  [facility];  Service: Endoscopy;;   CESAREAN SECTION     1996   CHOLECYSTECTOMY     COLECTOMY N/A 02/11/2023   Procedure: TOTAL COLECTOMY;  Surgeon: Harriette Bouillon, MD;  Location: [facility];  Service: General;  Laterality: N/A;   COLONOSCOPY  2010   Dr. Jena Gauss; negative except for hemorrhoids   COLOSTOMY  02/11/2023   Procedure: COLOSTOMY;  Surgeon: Harriette Bouillon, MD;  Location: [facility];  Service: General;;   ESOPHAGEAL DILATION N/A 10/25/2014   Procedure: ESOPHAGEAL DILATION;  Surgeon: Malissa Hippo, MD;  [facility];  Service: Endoscopy;  Laterality: N/A;   ESOPHAGOGASTRODUODENOSCOPY N/A 10/25/2014   Procedure: ESOPHAGOGASTRODUODENOSCOPY (EGD);  Surgeon: Malissa Hippo, MD;  [facility];  Service: Endoscopy;  Laterality: N/A;  1250   FLEXIBLE SIGMOIDOSCOPY N/A 02/06/2021   Procedure: FLEXIBLE SIGMOIDOSCOPY;  Surgeon: Lanelle Bal, DO;  [facility];  Service: Endoscopy;  Laterality: N/A;   FLEXIBLE SIGMOIDOSCOPY N/A 02/19/2021   Procedure: FLEXIBLE SIGMOIDOSCOPY;  Surgeon: Lanelle Bal, DO;  [facility];  Service: Endoscopy;  Laterality: N/A;   FLEXIBLE SIGMOIDOSCOPY N/A 02/02/2023   Procedure: FLEXIBLE SIGMOIDOSCOPY;  Surgeon: Beverley Fiedler, MD;  Location[facility] ENDOSCOPY;  Service: Gastroenterology;  Laterality: N/A;   FLEXIBLE SIGMOIDOSCOPY N/A 02/10/2023   Procedure: FLEXIBLE SIGMOIDOSCOPY;  Surgeon: Sherrilyn Rist, MD;  Location: [facility] ENDOSCOPY;  Service: Gastroenterology;  Laterality: N/A;   FLEXIBLE SIGMOIDOSCOPY N/A 04/08/2023   Procedure: FLEXIBLE SIGMOIDOSCOPY;  Surgeon: Corbin Ade, MD;  [facility];  Service: Endoscopy;  Laterality: N/A;   IR FLUORO GUIDE CV LINE LEFT  02/21/2023   IR US GUIDE VASC ACCESS LEFT  02/21/2023   LAPAROTOMY N/A 02/11/2023   Procedure: EXPLORATORY LAPAROTOMY;  Surgeon: Harriette Bouillon, MD;  [facility];  Service: General;  Laterality: N/A;   OOPHORECTOMY     left for torsion and ovarian fibroma; uterine myoma resected 1995   SIGMOIDOSCOPY  07/15/2021   Procedure: SIGMOIDOSCOPY;  Surgeon: Malissa Hippo, MD;  [facility];  Service: Endoscopy;;   TRANSESOPHAGEAL ECHOCARDIOGRAM (CATH LAB) N/A 02/18/2023   Procedure: TRANSESOPHAGEAL ECHOCARDIOGRAM;  Surgeon: Sande Rives, MD;  Location: [facility] INVASIVE CV LAB;  Service: Cardiovascular;  Laterality: N/A;     A IV Location/Drains/Wounds Patient Lines/Drains/Airways Status     Active Line/Drains/Airways     Name Placement date Placement time Site Days   Peripheral IV 06/19/23 22 G Anterior;Right;Upper Arm 06/19/23  0216  Arm  less than  1   Peripheral IV 06/19/23 20 G 1.88" Left;Upper Arm 06/19/23  0234  Arm  less than 1   Colostomy RUQ 02/11/23  1537  RUQ  128            Intake/Output Last 24 hours  Intake/Output Summary (Last 24 hours) at 06/19/2023 0844 Last data filed at 06/19/2023 0703 Gross per 24 hour  Intake 4.78 ml  Output --  Net 4.78 ml    Labs/Imaging Results for orders placed or performed during the hospital encounter of 06/19/23 (from the past 48 hours)  Comprehensive metabolic panel     Status: Abnormal   Collection Time: 06/19/23  2:38 AM  Result Value Ref Range   Sodium 132 (L) 135 - 145 mmol/L   Potassium 3.9 3.5 - 5.1 mmol/L   Chloride 99 98 - 111 mmol/L   CO2 22 22 - 32 mmol/L   Glucose, Bld 89 70 - 99 mg/dL    Comment: Glucose reference range applies only to samples taken after fasting for at least 8 hours.   BUN 14 6 - 20 mg/dL   Creatinine, Ser 5.40 (H) 0.44 - 1.00 mg/dL   Calcium 8.8 (L) 8.9 - 10.3 mg/dL   Total Protein 6.5 6.5 - 8.1 g/dL    Albumin 3.3 (L) 3.5 - 5.0 g/dL   AST 24 15 - 41 U/L   ALT 15 0 - 44 U/L   Alkaline Phosphatase 95 38 - 126 U/L   Total Bilirubin 0.3 0.0 - 1.2 mg/dL   GFR, Estimated 40 (L) >60 mL/min    Comment: (NOTE) Calculated using the CKD-EPI Creatinine Equation (2021)    Anion gap 11 5 - 15    Comment: Performed at [facility], [address], [place], [place] [address]  CBC     Status: Abnormal   Collection Time: 06/19/23  2:38 AM  Result Value Ref Range   WBC 12.4 (H) 4.0 - 10.5 K/uL   RBC 3.59 (L) 3.87 - 5.11 MIL/uL   Hemoglobin 10.2 (L) 12.0 - 15.0 g/dL   HCT 14.7 (L) 82.9 - 56.2 %   MCV 84.7 80.0 - 100.0 fL   MCH 28.4 26.0 - 34.0 pg   MCHC 33.6 30.0 - 36.0 g/dL   RDW 13.0 (H) 86.5 - 78.4 %   Platelets 586 (H) 150 - 400 K/uL   nRBC 0.0 0.0 - 0.2 %    Comment: Performed at [facility], [address], [place], [place] [address]  Lactic acid, plasma     Status: None   Collection Time: 06/19/23  2:38 AM  Result Value Ref Range   Lactic Acid, Venous 1.5 0.5 - 1.9 mmol/L    Comment: Performed at [facility], [address] [place], [place] [address]  Protime-INR     Status: None   Collection Time: 06/19/23  2:38 AM  Result Value Ref Range   Prothrombin Time 13.1 11.4 - 15.2 seconds   INR 1.0 0.8 - 1.2    Comment: (NOTE) INR goal varies based on device and disease states. Performed at [facility], [address] [place], [place] [address]   APTT     Status: None   Collection Time: 06/19/23  2:38 AM  Result Value Ref Range   aPTT 34 24 - 36 seconds    Comment: Performed at [facility], [address] [place], [place] [address]  Blood Culture (routine x 2)     Status: None (Preliminary result)   Collection Time:  06/19/23  2:38 AM   Specimen: BLOOD  Result Value Ref Range   Specimen Description BLOOD BLOOD LEFT ARM    Special Requests NONE    Culture      NO GROWTH < 12 HOURS Performed at [facility], [address], [place], [place] [address]    Report Status PENDING    Lipase, blood     Status: None   Collection Time: 06/19/23  2:38 AM  Result Value Ref Range   Lipase 32 11 - 51 U/L    Comment: Performed at [facility], [address], [place], [place] [address]  Acetaminophen level     Status: Abnormal   Collection Time: 06/19/23  2:38 AM  Result Value Ref Range   Acetaminophen (Tylenol), Serum <10 (L) 10 - 30 ug/mL    Comment: (NOTE) Therapeutic concentrations vary significantly. A range of 10-30 ug/mL  may be an effective concentration for many patients. However, some  are best treated at concentrations outside of this range. Acetaminophen concentrations >150 ug/mL at 4 hours after ingestion  and >50 ug/mL at 12 hours after ingestion are often associated with  toxic reactions.  Performed at [facility], [address], [place], [place] [address]   Salicylate level     Status: Abnormal   Collection Time: 06/19/23  2:38 AM  Result Value Ref Range   Salicylate Lvl <7.0 (L) 7.0 - 30.0 mg/dL    Comment: Performed at [facility], [address], [place], [place] [address]  Ethanol     Status: None   Collection Time: 06/19/23  2:38 AM  Result Value Ref Range   Alcohol, Ethyl (B) <10 <10 mg/dL    Comment: (NOTE) Lowest detectable limit for serum alcohol is 10 mg/dL.  For medical purposes only. Performed at [facility], [address], [place], [place] [address]   Blood Culture (routine x 2)     Status: None (Preliminary result)   Collection Time: 06/19/23  2:59 AM   Specimen: BLOOD RIGHT HAND  Result Value Ref Range   Specimen Description BLOOD RIGHT HAND    Special Requests      BOTTLES DRAWN AEROBIC ONLY Blood Culture results may not be optimal due to an inadequate volume of blood received in culture bottles   Culture      NO GROWTH <12 HOURS Performed at [facility], [address], [place], [place] [address]    Report Status PENDING   Lactic acid, plasma     Status: Abnormal   Collection Time: 06/19/23  4:18 AM  Result Value Ref  Range   Lactic Acid, Venous 2.5 (HH) 0.5 - 1.9 mmol/L    Comment: CRITICAL RESULT CALLED TO, READ BACK BY AND VERIFIED WITH PRUITT,G ON 06/19/23 AT 0455 Performed at [facility], [address], [place], [place] [address]   Urinalysis, w/ Reflex to Culture (Infection Suspected) -Urine, Clean Catch     Status: None   Collection Time: 06/19/23  5:55 AM  Result Value Ref Range   Specimen Source URINE, CLEAN CATCH    Color, Urine YELLOW YELLOW   APPearance CLEAR CLEAR   Specific Gravity, Urine 1.016 1.005 - 1.030   pH 5.0 5.0 - 8.0   Glucose, UA NEGATIVE NEGATIVE mg/dL   Hgb urine dipstick NEGATIVE NEGATIVE   Bilirubin Urine NEGATIVE NEGATIVE   Ketones, ur NEGATIVE NEGATIVE mg/dL   Protein, ur NEGATIVE NEGATIVE mg/dL   Nitrite NEGATIVE NEGATIVE   Leukocytes,Ua NEGATIVE NEGATIVE   RBC / HPF  0-5 0 - 5 RBC/hpf   WBC, UA 0-5 0 - 5 WBC/hpf    Comment:        Reflex urine culture not performed if WBC <=10, OR if Squamous epithelial cells >5. If Squamous epithelial cells >5 suggest recollection.    Bacteria, UA NONE SEEN NONE SEEN   Squamous Epithelial / HPF 0-5 0 - 5 /HPF    Comment: Performed at [facility], [address] [place], [place] [address]  CBG monitoring, ED     Status: None   Collection Time: 06/19/23  6:52 AM  Result Value Ref Range   Glucose-Capillary 70 70 - 99 mg/dL    Comment: Glucose reference range applies only to samples taken after fasting for at least 8 hours.   CT ABDOMEN PELVIS W CONTRAST Result Date: 06/19/2023 CLINICAL DATA:  Abdominal pain and possible Crohn's laceration EXAM: CT ABDOMEN AND PELVIS WITH CONTRAST TECHNIQUE: Multidetector CT imaging of the abdomen and pelvis was performed using the standard protocol following bolus administration of intravenous contrast. RADIATION DOSE REDUCTION: This exam was performed according to the departmental dose-optimization program which includes automated exposure control, adjustment of the mA and/or kV according to  patient size and/or use of iterative reconstruction technique. CONTRAST:  80mL OMNIPAQUE IOHEXOL 300 MG/ML  SOLN COMPARISON:  06/08/2023 FINDINGS: Lower chest: Mild atelectatic changes are noted in the right lung base. Hepatobiliary: No focal liver abnormality is seen. Status post cholecystectomy. No biliary dilatation. Pancreas: Unremarkable. No pancreatic ductal dilatation or surrounding inflammatory changes. Spleen: Normal in size without focal abnormality. Adrenals/Urinary Tract: Adrenal glands are within normal limits. Kidneys demonstrate a normal enhancement pattern bilaterally. No renal calculi or obstructive changes are seen. The bladder is well distended. Stomach/Bowel: Changes of prior colectomy are seen. Right-sided ileostomy is noted. No significant underlying inflammatory change is noted. Stomach is within normal limits. No small bowel abnormality is seen. Vascular/Lymphatic: Aortic atherosclerosis. No enlarged abdominal or pelvic lymph nodes. Reproductive: Status post hysterectomy. No adnexal masses. Other: No abdominal wall hernia or abnormality. No abdominopelvic ascites. Musculoskeletal: No acute bony abnormality is noted. IMPRESSION: Changes consistent with prior colectomy and right-sided ileostomy. No acute abnormality noted. Electronically Signed   By: Alcide Clever M.D.   On: 06/19/2023 03:47   CT HEAD WO CONTRAST ( ) Result Date: 06/19/2023 CLINICAL DATA:  Delirium EXAM: CT HEAD WITHOUT CONTRAST TECHNIQUE: Contiguous axial images were obtained from the base of the skull through the vertex without intravenous contrast. RADIATION DOSE REDUCTION: This exam was performed according to the departmental dose-optimization program which includes automated exposure control, adjustment of the mA and/or kV according to patient size and/or use of iterative reconstruction technique. COMPARISON:  CT head 01/27/2023. FINDINGS: Brain: No evidence of acute large vascular territory infarction, hemorrhage,  hydrocephalus, extra-axial collection or mass lesion/mass effect. Vascular: No hyperdense vessel. Skull: No acute fracture. Sinuses/Orbits: Clear sinuses.  No acute orbital findings. Other: No mastoid effusions. IMPRESSION: No evidence of acute intracranial abnormality. Electronically Signed   By: Feliberto Harts M.D.   On: 06/19/2023 03:39   DG Chest Port 1 View Result Date: 06/19/2023 CLINICAL DATA:  Possible sepsis EXAM: PORTABLE CHEST 1 VIEW COMPARISON:  04/23/2023 FINDINGS: Cardiac shadow is stable. Lungs are well aerated without focal infiltrate or effusion. No bony abnormality is seen. IMPRESSION: No acute abnormality noted. Electronically Signed   By: Alcide Clever M.D.   On: 06/19/2023 02:57    Pending Labs Unresulted Labs (From admission, onward)    None  Vitals/Pain Today's Vitals   06/19/23 0619 06/19/23 0630 06/19/23 0645 06/19/23 0655  BP:  91/62 (!) 80/60 120/78  Pulse:  69 66 68  Resp:  13 14 14   Temp: 97.8 F (36.6 C)     TempSrc:      SpO2:  100% 100% 100%  Weight:      Height:      PainSc:        Isolation Precautions No active isolations  Medications Medications  norepinephrine (LEVOPHED) 4mg  in (0.016 mg/mL) premix infusion (4 mcg/min Intravenous Infusion Verify 06/19/23 0703)  lactated ringers infusion ( Intravenous New Bag/Given 06/19/23 0619)  sodium chloride 0.9 % bolus 1,000 mL (0 mLs Intravenous Stopped 06/19/23 0430)  sodium chloride 0.9 % bolus 1,000 mL (0 mLs Intravenous Stopped 06/19/23 0609)  cefTRIAXone (ROCEPHIN) 2 g in sodium chloride 0.9 % 100 mL IVPB (0 g Intravenous Stopped 06/19/23 0308)  vancomycin (VANCOCIN) IVPB 1000 mg/200 mL premix (0 mg Intravenous Stopped 06/19/23 0342)  iohexol (OMNIPAQUE) 300 MG/ML solution 80 mL (80 mLs Intravenous Contrast Given 06/19/23 0314)  lactated ringers bolus 1,000 mL (0 mLs Intravenous Stopped 06/19/23 0609)      R Recommendations: See Admitting Provider Note  Report given to:  Worthy Rancher

## 2023-06-19 NOTE — H&P (Signed)
NAME:  [name], MRN:  [id number], DOB:  May 26, 1970, LOS: 0 ADMISSION DATE:  06/19/2023, CONSULTATION DATE:  06/19/2023 REFERRING MD:  Dr. Pilar Plate - EDP, CHIEF COMPLAINT:  Sepsis   History of Present Illness:  [name] is a 53 y.o. female with a PMH significant for HTN, HLD, type 2 diabetes, COPD, seizure disorder, fungal endocarditis 2024, and anxiety who presented to the ED at [facility] via EMS for concerns of colostomy complications.  On EMS arrival patient was seen lethargic for the setting of possible unintentional drug overdose with chronic opioids.  Narcan administered with little to no response seen.  On ED arrival patient was seen hypotensive.  Lab work significant for NA 132, creatinine 1.55, albumin 3.3, initial lactic 1.5 with uptrend to 2.5 while in ED, WBC 12.4, hemoglobin 10.2, platelets 586.  UA unremarkable.  CT head, abdomen and pelvis both negative for acute abnormalities.  Given hypotension on arrival patient was started on low-dose peripheral Levophed with consult PCCM for further management and admission  Pertinent  Medical History  HTN, HLD, type 2 diabetes, COPD, seizure disorder, fungal endocarditis 2024, and anxiety   Significant Hospital Events: Including procedures, antibiotic start and stop dates in addition to other pertinent events   2/16 admitted for complaints of malfunctioning colostomy, signs of sepsis on ED arrival.  Currently on low-dose peripheral Levophed  Interim History / Subjective:  As above   Objective   Blood pressure 120/78, pulse 68, temperature 97.8 F (36.6 C), resp. rate 14, height 5\' 4"  (1.626 m), weight 55 kg, SpO2 100%.        Intake/Output Summary (Last 24 hours) at 06/19/2023 0716 Last data filed at 06/19/2023 0703 Gross per 24 hour  Intake 4.78 ml  Output --  Net 4.78 ml   Filed Weights   06/19/23 0205  Weight: 55 kg    Examination: General: Acute on chronic ill-appearing thin middle-aged female lying in bed  in no acute distress HEENT: [place]/AT, MM pink/moist, PERRL,  Neuro: Lethargic but arousable, altered, seen moving all extremities spontaneously CV: s1s2 regular rate and rhythm, no murmur, rubs, or gallops,  PULM: Clear to auscultation bilaterally, no increased work of breathing, no added breath sounds GI: soft, bowel sounds active in all 4 quadrants, non-tender, non-distended, right lower quadrant ostomy pink Extremities: warm/dry, no edema  Skin: no rashes or lesions  Resolved Hospital Problem list     Assessment & Plan:  Concern for evolving sepsis versus hypovolemic shock -Patient presented with hypothermia and hypotension with associated initial lactate of 1.5 with uptrend to 2.5 leukocytosis with WBC 12.4 -CT abdomen negative -Per chart review it appears patient has multiple admissions for dehydration resultant AKI given high output ostomy.  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 History of Crohn's disease -Patient has had multiple admissions secondary to dehydration and AKI  in the setting of high output ostomy  P: Continue IV hydration Monitor urine output Follow ostomy output Strict intake and output  Essential HTN HLD -Home medication includes Eliquis and metoprolol -Unsure rationale for  chronic anticoagulation P: Hold anticoagulation currently Hold antihypertensives given hypotension currently Imagery  History of right upper extremity DVT in the setting of PICC line October 2024 -Unsure indication of why patient remains anticoagulated P: At this time hold anticoagulation until repeat CBC is completed, patient appears very pale on exam  Seizure disorder -Medications include Keppra 750 mg twice daily P: Continue home Keppra Seizure precautions  Chronic pain -Multiple opioids and benzodiazepines on more: P: Hold home medication as patient is currently minimally  arousable   Best Practice (right click and "Reselect all SmartList Selections" daily)   Diet/type: NPO DVT prophylaxis LMWH Pressure ulcer(s): N/A GI prophylaxis: N/A Lines: N/A Foley:  N/A Code Status:  full code Last date of multidisciplinary goals of care discussion: Pending   Labs   CBC: Recent Labs  Lab 06/19/23 0238  WBC 12.4*  HGB 10.2*  HCT 30.4*  MCV 84.7  PLT 586*    Basic Metabolic Panel: Recent Labs  Lab 06/19/23 0238  NA 132*  K 3.9  CL 99  CO2 22  GLUCOSE 89  BUN 14  CREATININE 1.55*  CALCIUM 8.8*   GFR: Estimated Creatinine Clearance: 35.8 mL/min (A) (by C-G formula based on SCr of 1.55 mg/dL (H)). Recent Labs  Lab 06/19/23 0238 06/19/23 0418  WBC 12.4*  --   LATICACIDVEN 1.5 2.5*    Liver Function Tests: Recent Labs  Lab 06/19/23 0238  AST 24  ALT 15  ALKPHOS 95  BILITOT 0.3  PROT 6.5  ALBUMIN 3.3*   Recent Labs  Lab 06/19/23 0238  LIPASE 32   No results for input(s): "AMMONIA" in the last 168 hours.  ABG    Component Value Date/Time   PHART 7.314 (L) 02/11/2023 1633   PCO2ART 36.9 02/11/2023 1633   PO2ART 130 (H) 02/11/2023 1633   HCO3 18.7 (L) 02/11/2023 1633   TCO2 20 (L) 02/11/2023 1633   ACIDBASEDEF 7.0 (H) 02/11/2023 1633   O2SAT 99 02/11/2023 1633     Coagulation Profile: Recent Labs  Lab 06/19/23 0238  INR 1.0     Cardiac Enzymes: No results for input(s): "CKTOTAL", "CKMB", "CKMBINDEX", "TROPONINI" in the last 168 hours.  HbA1C: Hgb A1c MFr Bld  Date/Time Value Ref Range Status  01/27/2023 11:43 AM 5.8 (H) 4.8 - 5.6 % Final    Comment:    (NOTE) Pre diabetes:          5.7%-6.4%  Diabetes:              >6.4%  Glycemic control for   <7.0% adults with diabetes   12/23/2021 03:29 PM 4.7 (L) 4.8 - 5.6 % Final    Comment:    (NOTE) Pre diabetes:          5.7%-6.4%  Diabetes:              >6.4%  Glycemic control for   <7.0% adults with diabetes     CBG: Recent Labs  Lab 06/19/23 0652  GLUCAP 70    Review of Systems:   Unable to assess   Past Medical History:  She,  has a past medical history of Anxiety, Asthma, Chronic low back pain, Collagen vascular disease (HCC), COPD (chronic obstructive pulmonary disease) (HCC), Crohn's disease (HCC), Fungal endocarditis (03/28/2023), GERD (gastroesophageal reflux  disease), History of head injury, Hypertension, IBS (irritable bowel syndrome), PSVT (paroxysmal supraventricular tachycardia) (HCC), PTSD (post-traumatic stress disorder), Recurrent chest pain, Seizure disorder (HCC), and Type 2 diabetes mellitus (HCC).   Surgical History:   Past Surgical History:  Procedure Laterality Date   ABDOMINAL HYSTERECTOMY     with right salpingo oophorectomy 2005   APPENDECTOMY  2006   BALLOON DILATION N/A 02/19/2021   Procedure: BALLOON DILATION;  Surgeon: Lanelle Bal, DO;  Location: [facility];  Service: Endoscopy;  Laterality: N/A;  Sigmoid colon stricture   BIOPSY  02/06/2021   Procedure: BIOPSY;  Surgeon: Lanelle Bal, DO;  Location: [facility];  Service: Endoscopy;;   BIOPSY  02/19/2021   Procedure: BIOPSY;  Surgeon: Lanelle Bal, DO;  [facility]: [facility];  Service: Endoscopy;;   BIOPSY  07/15/2021   Procedure: BIOPSY;  Surgeon: Malissa Hippo, MD;  [facility]: [facility];  Service: Endoscopy;;   BIOPSY  04/08/2023    Procedure: BIOPSY;  Surgeon: Corbin Ade, MD;  [facility]: [facility];  Service: Endoscopy;;   CESAREAN SECTION     1996   CHOLECYSTECTOMY     COLECTOMY N/A 02/11/2023   Procedure: TOTAL COLECTOMY;  Surgeon: Harriette Bouillon, MD;  Location: [facility];  Service: General;  Laterality: N/A;   COLONOSCOPY  2010   Dr. Jena Gauss; negative except for hemorrhoids   COLOSTOMY  02/11/2023   Procedure: COLOSTOMY;  Surgeon: Harriette Bouillon, MD;  Location: [facility];  Service: General;;   ESOPHAGEAL DILATION N/A 10/25/2014   Procedure: ESOPHAGEAL DILATION;  Surgeon: Malissa Hippo, MD;  [facility];  Service: Endoscopy;  Laterality: N/A;   ESOPHAGOGASTRODUODENOSCOPY N/A 10/25/2014   Procedure: ESOPHAGOGASTRODUODENOSCOPY (EGD);  Surgeon: Malissa Hippo, MD;  [facility]: [facility];  Service: Endoscopy;  Laterality: N/A;  1250   FLEXIBLE SIGMOIDOSCOPY N/A 02/06/2021   Procedure: FLEXIBLE SIGMOIDOSCOPY;  Surgeon: Lanelle Bal, DO;  [facility];  Service: Endoscopy;  Laterality: N/A;   FLEXIBLE SIGMOIDOSCOPY N/A 02/19/2021   Procedure: FLEXIBLE SIGMOIDOSCOPY;  Surgeon: Lanelle Bal, DO;  [facility];  Service: Endoscopy;  Laterality: N/A;   FLEXIBLE SIGMOIDOSCOPY N/A 02/02/2023   Procedure: FLEXIBLE SIGMOIDOSCOPY;  Surgeon: Beverley Fiedler, MD;  Location: [facility] ENDOSCOPY;  Service: Gastroenterology;  Laterality: N/A;   FLEXIBLE SIGMOIDOSCOPY N/A 02/10/2023   Procedure: FLEXIBLE SIGMOIDOSCOPY;  Surgeon: Sherrilyn Rist, MD;  Location: [facility] ENDOSCOPY;  Service: Gastroenterology;  Laterality: N/A;   FLEXIBLE SIGMOIDOSCOPY N/A 04/08/2023   Procedure: FLEXIBLE SIGMOIDOSCOPY;  Surgeon: Corbin Ade, MD;  [facility];  Service: Endoscopy;  Laterality: N/A;   IR FLUORO GUIDE CV LINE LEFT  02/21/2023   IR US GUIDE VASC ACCESS LEFT  02/21/2023   LAPAROTOMY N/A 02/11/2023   Procedure: EXPLORATORY LAPAROTOMY;  Surgeon: Harriette Bouillon, MD;  Location: [facility];  Service:  General;  Laterality: N/A;   OOPHORECTOMY     left for torsion and ovarian fibroma; uterine myoma resected 1995   SIGMOIDOSCOPY  07/15/2021   Procedure: SIGMOIDOSCOPY;  Surgeon: Malissa Hippo, MD;  [facility];  Service: Endoscopy;;   TRANSESOPHAGEAL ECHOCARDIOGRAM (CATH LAB) N/A 02/18/2023   Procedure: TRANSESOPHAGEAL ECHOCARDIOGRAM;  Surgeon: Sande Rives, MD;  Location: [facility] INVASIVE CV LAB;  Service: Cardiovascular;  Laterality: N/A;     Social History:   reports that she has been smoking cigarettes. She started smoking about 38 years ago. She has a 19.4 pack-year  smoking history. She has been exposed to tobacco smoke. She has never used smokeless tobacco. She reports current drug use. Drug: Marijuana. She reports that she does not drink alcohol.   Family History:  Her family history includes Cancer in her mother; Cancer (age of onset: 24) in her father; Crohn's disease in her cousin; Diabetes in her father; Heart attack in her mother; Heart attack (age of onset: 26) in her father; Heart failure in her father and mother; Hypertension in her mother.   Allergies Allergies  Allergen Reactions   Codeine Shortness Of Breath, Swelling and Rash    Throat swelling   Hydrocodone Shortness Of Breath, Swelling and Rash   Ketorolac Nausea And Vomiting, Swelling and Nausea Only    Pt reports n/v and swelling to throat   Morphine And Codeine Shortness Of Breath, Swelling and Rash   Penicillins Shortness Of Breath, Swelling and Other (See Comments)    Throat swells 02/06/21--TOLERATES CEFTRIAXONE     Nickel Rash   Pregabalin Other (See Comments)   Acetaminophen     Per MD patient states she can't take Tylenol because of liver enzymes   Atarax [Hydroxyzine] Itching and Swelling    Mildly swollen throat   Ativan [Lorazepam] Other (See Comments)    Migraines.   Oxycodone-Acetaminophen Nausea Only and Nausea And Vomiting   Strawberry Extract Swelling   Watermelon  Concentrate Swelling   Albuterol Palpitations   Benadryl [Diphenhydramine Hcl] Palpitations   Humira [Adalimumab] Rash   Latex Rash   Remicade [Infliximab] Rash    Blisters and Welts    Tramadol Rash    Rash and itching     Home Medications  Prior to Admission medications   Medication Sig Start Date End Date Taking? Authorizing Provider  albuterol (ACCUNEB) 1.25 MG/3ML nebulizer solution Take 1 ampule by nebulization every 6 (six) hours as needed for wheezing or shortness of breath. 04/21/23   [provider]  ALPRAZolam Prudy Feeler) 1 MG tablet Take 1 mg by mouth 3 (three) times daily. 07/11/19   [provider]  amitriptyline (ELAVIL) 100 MG tablet Take 100 mg by mouth at bedtime.    [provider]  apixaban (ELIQUIS) 2.5 MG TABS tablet Take 1 tablet (2.5 mg total) by mouth 2 (two) times daily. 04/25/23   Johnson, Clanford L, MD  cetirizine (ZYRTEC) 10 MG tablet Take 10 mg by mouth daily. 01/08/22   [provider]  DULoxetine (CYMBALTA) 30 MG capsule Take 1 capsule (30 mg total) by mouth daily. 04/25/23   Johnson, Clanford L, MD  feeding supplement (ENSURE ENLIVE / ENSURE PLUS) LIQD Take 237 mLs by mouth 2 (two) times daily between meals. 01/04/22   Vassie Loll, MD  gabapentin (NEURONTIN) 300 MG capsule Take 300 mg by mouth 2 (two) times daily. 10/15/22   [provider]  HYDROmorphone (DILAUDID) 2 MG tablet Take 1 tablet (2 mg total) by mouth every 6 (six) hours as needed for severe pain (pain score 7-10). 05/12/23   Sherryll Burger, Pratik D, DO  hydrOXYzine (ATARAX) 50 MG tablet Take 25-50 mg by mouth 3 (three) times daily as needed for anxiety or nausea.    [provider]  ibuprofen (ADVIL) 200 MG tablet Take 200 mg by mouth every 6 (six) hours as needed for mild pain (pain score 1-3).    [provider]  levETIRAcetam (KEPPRA) 750 MG tablet Take 750 mg by mouth 2 (two) times daily. 09/30/20   [provider]  loperamide (IMODIUM)  2  MG capsule Take 1 capsule (2 mg total) by mouth every 6 (six) hours. 04/25/23   Johnson, Clanford L, MD  mesalamine (ROWASA) 4 g enema Place 60 mLs (4 g total) rectally at bedtime. 04/22/23   Rourk, Gerrit Friends, MD  metoprolol tartrate (LOPRESSOR) 25 MG tablet Take 0.5 tablets (12.5 mg total) by mouth 2 (two) times daily. 04/10/23   Johnson, Clanford L, MD  midodrine (PROAMATINE) 5 MG tablet Take 1 tablet (5 mg total) by mouth 3 (three) times daily with meals. 04/10/23   Johnson, Clanford L, MD  nitroGLYCERIN (NITROSTAT) 0.4 MG SL tablet Place 1 tablet (0.4 mg total) under the tongue every 5 (five) minutes as needed for chest pain. 04/25/23   Johnson, Clanford L, MD  nystatin (MYCOSTATIN/NYSTOP) powder Apply topically 3 (three) times daily. 05/12/23   Sherryll Burger, Pratik D, DO  ondansetron (ZOFRAN) 4 MG tablet Take 4 mg by mouth every 8 (eight) hours as needed. 04/25/23   [provider]  ondansetron (ZOFRAN) 4 MG tablet Take 1 tablet (4 mg total) by mouth every 6 (six) hours as needed for nausea. 05/12/23   Sherryll Burger, Pratik D, DO  pantoprazole (PROTONIX) 40 MG tablet Take 1 tablet (40 mg total) by mouth 2 (two) times daily. 01/03/22   Vassie Loll, MD  prochlorperazine (COMPAZINE) 5 MG tablet Take 1 tablet (5 mg total) by mouth every 6 (six) hours as needed for refractory nausea / vomiting. 05/12/23   Maurilio Lovely D, DO     Critical care time:  CRITICAL CARE Performed by: Cornell Gaber D. Harris   Total critical care time: 40 minutes  Critical care time was exclusive of separately billable procedures and treating other patients.  Critical care was necessary to treat or prevent imminent or life-threatening deterioration.  Critical care was time spent personally by me on the following activities: development of treatment plan with patient and/or surrogate as well as nursing, discussions with consultants, evaluation of patient's response to treatment, examination of patient, obtaining history from patient or  surrogate, ordering and performing treatments and interventions, ordering and review of laboratory studies, ordering and review of radiographic studies, pulse oximetry and re-evaluation of patient's condition. Shadi Sessler D. Harris, NP-C [facility] Personal contact information can be found on Amion  If no contact or response made please call [phone] 06/19/2023, 10:15 AM

## 2023-06-19 NOTE — ED Provider Notes (Signed)
[facility]-EMERGENCY DEPT [facility] Emergency Department Provider Note MRN:  [id number]  Arrival date & time: 06/19/23     Chief Complaint   Colostomy Problem   History of Present Illness   [name] is a 53 y.o. year-old female with a history of Crohn's disease, COPD, collagen vascular disease, diabetes presenting to the ED with chief complaint of colostomy problem.  Initial call out for EMS for colostomy bag leaking.  On arrival patient is lethargic, may have taken too much of her medications.  Narcan with minimal response.  Patient with slurred speech and global confusion, hypotensive.  Review of Systems  I was unable to obtain a full/accurate HPI, PMH, or ROS due to the patient's altered mental status.  Patient's Health History    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   GERD (gastroesophageal reflux disease)    EGD 01/2007 by Dr.Rourke small hiatal hernia s/p 56 french maloney    History of head injury    Hypertension    IBS (irritable bowel syndrome)    PSVT (paroxysmal supraventricular tachycardia) (HCC)    PTSD (post-traumatic stress disorder)    Recurrent chest pain    Seizure disorder (HCC)    Type 2 diabetes mellitus (HCC)     Past Surgical History:  Procedure Laterality Date   ABDOMINAL HYSTERECTOMY     with right salpingo oophorectomy 2005   APPENDECTOMY  2006   BALLOON DILATION N/A 02/19/2021   Procedure: BALLOON DILATION;  Surgeon: Lanelle Bal, DO;  Location: [facility] ENDO SUITE;  Service: Endoscopy;  Laterality: N/A;  Sigmoid colon stricture   BIOPSY  02/06/2021   Procedure: BIOPSY;  Surgeon: Lanelle Bal, DO;  Location: [facility] ENDO SU[facility];  Service: Endoscopy;;   BIOPSY  02/19/2021   Procedure: BIOPSY;  Surgeon: Lanelle Bal, DO;  Location: [facility] ENDO  SUITE;  Service: Endoscopy;;   BIOPSY  07/15/2021   Procedure: BIOPSY;  Surgeon: Malissa Hippo, MD;  Location: [facility] ENDO SUITE;  Service: Endoscopy;;   BIOPSY  04/08/2023   Procedure: BIOPSY;  Surgeon: Corbin Ade, MD;  Location: [facility] ENDO SUITE;  Service: Endoscopy;;   CESAREAN SECTION     1996   CHOLECYSTECTOMY     COLECTOMY N/A 02/11/2023   Procedure: TOTAL COLECTOMY;  Surgeon: Harriette Bouillon, MD;  Location: [facility] OR;  Service: General;  Laterality: N/A;   COLONOSCOPY  2010   Dr. Jena Gauss; negative except for hemorrhoids   COLOSTOMY  02/11/2023   Procedure: COLOSTOMY;  Surgeon: Harriette Bouillon, MD;  Location: [facility] OR;  Service: General;;   ESOPHAGEAL DILATION N/A 10/25/2014   Procedure: ESOPHAGEAL DILATION;  Surgeon: Malissa Hippo, MD;  Location: [facility];  Service: Endoscopy;  Laterality: N/A;   ESOPHAGOGASTRODUODENOSCOPY N/A 10/25/2014   Procedure: ESOPHAGOGASTRODUODENOSCOPY (EGD);  Surgeon: Malissa Hippo, MD;  Location: [facility];  Service: Endoscopy;  Laterality: N/A;  1250   FLEXIBLE SIGMOIDOSCOPY N/A 02/06/2021   Procedure: FLEXIBLE SIGMOIDOSCOPY;  Surgeon: Lanelle Bal, DO;  Location: [facility];  Service: Endoscopy;  Laterality: N/A;   FLEXIBLE SIGMOIDOSCOPY N/A 02/19/2021   Procedure: FLEXIBLE SIGMOIDOSCOPY;  Surgeon: Lanelle Bal, DO;  Location[facility];  Service: Endoscopy;  Laterality: N/A;   FLEXIBLE SIGMOIDOSCOPY N/A 02/02/2023  Procedure: FLEXIBLE SIGMOIDOSCOPY;  Surgeon: Beverley Fiedler, MD;  Location: [facility] ENDOSCOPY;  Service: Gastroenterology;  Laterality: N/A;   FLEXIBLE SIGMOIDOSCOPY N/A 02/10/2023   Procedure: FLEXIBLE SIGMOIDOSCOPY;  Surgeon: Sherrilyn Rist, MD;  Location: [facility] ENDOSCOPY;  Service: Gastroenterology;  Laterality: N/A;   FLEXIBLE SIGMOIDOSCOPY N/A 04/08/2023   Procedure: FLEXIBLE SIGMOIDOSCOPY;  Surgeon: Corbin Ade, MD;  Location[facility];  Service: Endoscopy;  Laterality: N/A;   IR FLUORO GUIDE CV LINE LEFT   02/21/2023   IR US GUIDE VASC ACCESS LEFT  02/21/2023   LAPAROTOMY N/A 02/11/2023   Procedure: EXPLORATORY LAPAROTOMY;  Surgeon: Harriette Bouillon, MD;  Location: [facility] OR;  Service: General;  Laterality: N/A;   OOPHORECTOMY     left for torsion and ovarian fibroma; uterine myoma resected 1995   SIGMOIDOSCOPY  07/15/2021   Procedure: SIGMOIDOSCOPY;  Surgeon: Malissa Hippo, MD;  Location[facility];  Service: Endoscopy;;   TRANSESOPHAGEAL ECHOCARDIOGRAM (CATH LAB) N/A 02/18/2023   Procedure: TRANSESOPHAGEAL ECHOCARDIOGRAM;  Surgeon: Sande Rives, MD;  Location: [facility] INVASIVE CV LAB;  Service: Cardiovascular;  Laterality: N/A;    Family History  Problem Relation Age of Onset   Cancer Mother    Heart failure Mother    Hypertension Mother    Heart attack Mother    Heart attack Father 19   Cancer Father 80   Heart failure Father    Diabetes Father    Crohn's disease Cousin     Social History   Socioeconomic History   Marital status: Divorced    Spouse name: Not on file   Number of children: 1   Years of education: Not on file   Highest education level: Not on file  Occupational History   Not on file  Tobacco Use   Smoking status: Every Day    Current packs/day: 0.50    Average packs/day: 0.5 packs/day for 38.7 years (19.4 ttl pk-yrs)    Types: Cigarettes    Start date: 09/26/1984    Passive exposure: Current   Smokeless tobacco: Never  Vaping Use   Vaping status: Never Used  Substance and Sexual Activity   Alcohol use: No    Alcohol/week: 0.0 standard drinks of alcohol   Drug use: Yes    Types: Marijuana   Sexual activity: Yes    Birth control/protection: Surgical  Other Topics Concern   Not on file  Social History Narrative   Not on file   Social Drivers of Health   Financial Resource Strain: Medium Risk (06/09/2023)   Received from [facility]   Overall Financial Resource Strain (CARDIA)    Difficulty of Paying Living Expenses: Somewhat hard  Food  Insecurity: Food Insecurity Present (06/09/2023)   Received from [facility]   Hunger Vital Sign    Worried About Running Out of Food in the Last Year: Sometimes true    Ran Out of Food in the Last Year: Sometimes true  Transportation Needs: No Transportation Needs (06/09/2023)   Received from [facility]   PRAPARE - Transportation    Lack of Transportation (Medical): No    Lack of Transportation (Non-Medical): No  Recent Concern: Transportation Needs - Unmet Transportation Needs (05/10/2023)   PRAPARE - Transportation    Lack of Transportation (Medical): Yes    Lack of Transportation (Non-Medical): Yes  Physical Activity: Inactive (06/09/2023)   Received from [facility]   Exercise Vital Sign    Days of Exercise per Week: 0  days    Minutes of Exercise per Session: 0 min  Stress: Stress Concern Present (06/09/2023)   Received from [facility] - Occupational Stress Questionnaire    Feeling of Stress : To some extent  Social Connections: Socially Isolated (06/09/2023)   Received from [facility]   Social Connection and Isolation Panel [NHANES]    Frequency of Communication with Friends and Family: Twice a week    Frequency of Social Gatherings with Friends and Family: Twice a week    Attends Religious Services: Never    Database administrator or Organizations: No    Attends Banker Meetings: Never    Marital Status: Divorced  Catering manager Violence: Not At Risk (06/09/2023)   Received from [facility]   Humiliation, Afraid, Rape, and Kick questionnaire    Fear of Current or Ex-Partner: No    Emotionally Abused: No    Physically Abused: No    Sexually Abused: No     Physical Exam   Vitals:   06/19/23 0415 06/19/23 0500  BP: (!) 85/61 94/61  Pulse: 66 67  Resp: 16 14  Temp:    SpO2: 100% 99%    CONSTITUTIONAL: Ill-appearing NEURO/PSYCH: Somnolent, wakes to voice, moves all extremities, slurred  speech EYES:  eyes equal and reactive ENT/NECK:  no LAD, no JVD CARDIO: Regular rate, well-perfused, normal S1 and S2 PULM:  CTAB no wheezing or rhonchi GI/GU: Tenderness and erythema surrounding the right lower quadrant colostomy site MSK/SPINE:  No gross deformities, no edema SKIN:  no rash, atraumatic   *Additional and/or pertinent findings included in MDM below  Diagnostic and Interventional Summary    EKG Interpretation Date/Time:  Sunday June 19 2023 02:10:07 EST Ventricular Rate:  71 PR Interval:  54 QRS Duration:  124 QT Interval:  441 QTC Calculation: 480 R Axis:   85  Text Interpretation: Sinus rhythm Short PR interval LAE, consider biatrial enlargement IVCD, consider atypical RBBB Confirmed by Kennis Carina [phone]) on 06/19/2023 2:39:14 AM       Labs Reviewed  COMPREHENSIVE METABOLIC PANEL - Abnormal; Notable for the following components:      Result Value   Sodium 132 (*)    Creatinine, Ser 1.55 (*)    Calcium 8.8 (*)    Albumin 3.3 (*)    GFR, Estimated 40 (*)    All other components within normal limits  CBC - Abnormal; Notable for the following components:   WBC 12.4 (*)    RBC 3.59 (*)    Hemoglobin 10.2 (*)    HCT 30.4 (*)    RDW 16.4 (*)    Platelets 586 (*)    All other components within normal limits  LACTIC ACID, PLASMA - Abnormal; Notable for the following components:   Lactic Acid, Venous 2.5 (*)    All other components within normal limits  ACETAMINOPHEN LEVEL - Abnormal; Notable for the following components:   Acetaminophen (Tylenol), Serum <10 (*)    All other components within normal limits  SALICYLATE LEVEL - Abnormal; Notable for the following components:   Salicylate Lvl <7.0 (*)    All other components within normal limits  CULTURE, BLOOD (ROUTINE X 2)  CULTURE, BLOOD (ROUTINE X 2)  LACTIC ACID, PLASMA  PROTIME-INR  APTT  LIPASE, BLOOD  ETHANOL  URINALYSIS, W/ REFLEX TO CULTURE (INFECTION SUSPECTED)  CBG MONITORING, ED     CT HEAD WO CONTRAST ( )  Final Result  CT ABDOMEN PELVIS W CONTRAST  Final Result    DG Chest Port 1 View  Final Result      Medications  norepinephrine (LEVOPHED) 4mg  in (0.016 mg/mL) premix infusion (has no administration in time range)  sodium chloride 0.9 % bolus 1,000 mL (0 mLs Intravenous Stopped 06/19/23 0430)  sodium chloride 0.9 % bolus 1,000 mL (1,000 mLs Intravenous New Bag/Given 06/19/23 0313)  cefTRIAXone (ROCEPHIN) 2 g in sodium chloride 0.9 % 100 mL IVPB (0 g Intravenous Stopped 06/19/23 0308)  vancomycin (VANCOCIN) IVPB 1000 mg/200 mL premix (0 mg Intravenous Stopped 06/19/23 0342)  iohexol (OMNIPAQUE) 300 MG/ML solution 80 mL (80 mLs Intravenous Contrast Given 06/19/23 0314)  lactated ringers bolus 1,000 mL (1,000 mLs Intravenous New Bag/Given 06/19/23 0502)     Procedures  /  Critical Care .Critical Care  Performed by: Sabas Sous, MD Authorized by: Sabas Sous, MD   Critical care provider statement:    Critical care time (minutes):  85   Critical care was necessary to treat or prevent imminent or life-threatening deterioration of the following conditions:  Sepsis   Critical care was time spent personally by me on the following activities:  Development of treatment plan with patient or surrogate, discussions with consultants, evaluation of patient's response to treatment, examination of patient, ordering and review of laboratory studies, ordering and review of radiographic studies, ordering and performing treatments and interventions, pulse oximetry, re-evaluation of patient's condition and review of old charts   ED Course and Medical Decision Making  Initial Impression and Ddx Concern for sepsis, possible cellulitis surrounding the ostomy site.  Patient has an extensive past medical history making her high risk for sepsis.  Could also be overdose, electrolyte disturbance, polypharmacy.  Past medical/surgical history that increases complexity of ED  encounter: History of Crohn's, COPD, diabetes, endocarditis  Interpretation of Diagnostics I personally reviewed the EKG and my interpretation is as follows: Sinus rhythm  Labs reveal mild leukocytosis, hyponatremia, mild lactic acid elevation.  Patient Reassessment and Ultimate Disposition/Management     Patient with continued low blood pressures despite crystalloid resuscitation.  She seems fairly well perfused though her lactic acid is minimally elevated.  She is not tachycardic, her mentation is improved.  Will start low-dose peripheral norepinephrine and request admission to ICU.  Patient management required discussion with the following services or consulting groups:  Intensivist Service  Complexity of Problems Addressed Acute illness or injury that poses threat of life of bodily function  Additional Data Reviewed and Analyzed Further history obtained from: Prior labs/imaging results  Additional Factors Impacting ED Encounter Risk Consideration of hospitalization  Elmer Sow. Pilar Plate, MD [facility] Emergency Medicine [facility] [email or web address]  Final Clinical Impressions(s) / ED Diagnoses     ICD-10-CM   1. Septic shock (HCC)  A41.9    R65.21       ED Discharge Orders     None        Discharge Instructions Discussed with and Provided to Patient:   Discharge Instructions   None      Sabas Sous, MD 06/19/23 [phone]

## 2023-06-19 NOTE — Progress Notes (Signed)
Patient IV's infiltrated.  Had to stop IV medications running.  Ordered placed to get IV team to start new IV's.  Pharmacy notified and will receive medications for infiltration/extravasation protocol.

## 2023-06-19 NOTE — Progress Notes (Signed)
Pt arrive to room 3M02, alert to self,RA, on levo @ 4 mcg.

## 2023-06-19 NOTE — Sepsis Progress Note (Signed)
 Elink following for sepsis protocol.

## 2023-06-20 ENCOUNTER — Other Ambulatory Visit: Payer: Self-pay

## 2023-06-20 DIAGNOSIS — E860 Dehydration: Secondary | ICD-10-CM | POA: Diagnosis not present

## 2023-06-20 DIAGNOSIS — R579 Shock, unspecified: Secondary | ICD-10-CM

## 2023-06-20 LAB — BASIC METABOLIC PANEL
Anion gap: 10 (ref 5–15)
BUN: 6 mg/dL (ref 6–20)
CO2: 19 mmol/L — ABNORMAL LOW (ref 22–32)
Calcium: 8 mg/dL — ABNORMAL LOW (ref 8.9–10.3)
Chloride: 106 mmol/L (ref 98–111)
Creatinine, Ser: 1.1 mg/dL — ABNORMAL HIGH (ref 0.44–1.00)
GFR, Estimated: 60 mL/min — ABNORMAL LOW (ref >60)
Glucose, Bld: 82 mg/dL (ref 70–99)
Potassium: 3.5 mmol/L (ref 3.5–5.1)
Sodium: 135 mmol/L (ref 135–145)

## 2023-06-20 LAB — CBC
HCT: 25.4 % — ABNORMAL LOW (ref 36.0–46.0)
Hemoglobin: 8.5 g/dL — ABNORMAL LOW (ref 12.0–15.0)
MCH: 28.1 pg (ref 26.0–34.0)
MCHC: 33.5 g/dL (ref 30.0–36.0)
MCV: 84.1 fL (ref 80.0–100.0)
Platelets: 540 10*3/uL — ABNORMAL HIGH (ref 150–400)
RBC: 3.02 MIL/uL — ABNORMAL LOW (ref 3.87–5.11)
RDW: 16.9 % — ABNORMAL HIGH (ref 11.5–15.5)
WBC: 9.3 10*3/uL (ref 4.0–10.5)
nRBC: 0 % (ref 0.0–0.2)

## 2023-06-20 LAB — MAGNESIUM: Magnesium: 0.9 mg/dL — CL (ref 1.7–2.4)

## 2023-06-20 LAB — GLUCOSE, CAPILLARY: Glucose-Capillary: 93 mg/dL (ref 70–99)

## 2023-06-20 LAB — PHOSPHORUS: Phosphorus: 3.9 mg/dL (ref 2.5–4.6)

## 2023-06-20 MED ORDER — HYDROXYZINE HCL 25 MG PO TABS
25.0000 mg | ORAL_TABLET | Freq: Three times a day (TID) | ORAL | Status: DC | PRN
  Administered 2023-06-20 – 2023-06-22 (×3): 25 mg via ORAL
  Filled 2023-06-20 (×4): qty 1

## 2023-06-20 MED ORDER — ALPRAZOLAM 0.25 MG PO TABS
0.2500 mg | ORAL_TABLET | Freq: Three times a day (TID) | ORAL | Status: DC | PRN
  Administered 2023-06-20 – 2023-06-28 (×21): 0.25 mg via ORAL
  Filled 2023-06-20 (×21): qty 1

## 2023-06-20 MED ORDER — POTASSIUM CHLORIDE CRYS ER 20 MEQ PO TBCR
40.0000 meq | EXTENDED_RELEASE_TABLET | Freq: Once | ORAL | Status: AC
  Administered 2023-06-20: 40 meq via ORAL
  Filled 2023-06-20: qty 2

## 2023-06-20 MED ORDER — ACETAMINOPHEN 325 MG PO TABS
650.0000 mg | ORAL_TABLET | Freq: Four times a day (QID) | ORAL | Status: DC | PRN
  Administered 2023-06-20 – 2023-06-27 (×18): 650 mg via ORAL
  Filled 2023-06-20 (×18): qty 2

## 2023-06-20 MED ORDER — GERHARDT'S BUTT CREAM
TOPICAL_CREAM | Freq: Two times a day (BID) | CUTANEOUS | Status: DC
  Administered 2023-06-23 – 2023-06-24 (×2): 1 via TOPICAL
  Filled 2023-06-20 (×2): qty 60

## 2023-06-20 MED ORDER — MAGNESIUM SULFATE 2 GM/50ML IV SOLN
2.0000 g | Freq: Once | INTRAVENOUS | Status: AC
  Administered 2023-06-20: 2 g via INTRAVENOUS
  Filled 2023-06-20: qty 50

## 2023-06-20 MED ORDER — IBUPROFEN 400 MG PO TABS
400.0000 mg | ORAL_TABLET | Freq: Four times a day (QID) | ORAL | Status: DC | PRN
  Administered 2023-06-20 – 2023-06-24 (×7): 400 mg via ORAL
  Filled 2023-06-20 (×2): qty 2
  Filled 2023-06-20: qty 1
  Filled 2023-06-20: qty 2
  Filled 2023-06-20 (×2): qty 1
  Filled 2023-06-20: qty 2
  Filled 2023-06-20: qty 1

## 2023-06-20 MED ORDER — FOLIC ACID 1 MG PO TABS
1.0000 mg | ORAL_TABLET | Freq: Every day | ORAL | Status: DC
  Administered 2023-06-20 – 2023-06-27 (×8): 1 mg via ORAL
  Filled 2023-06-20 (×9): qty 1

## 2023-06-20 MED ORDER — LEVETIRACETAM 500 MG PO TABS
750.0000 mg | ORAL_TABLET | Freq: Two times a day (BID) | ORAL | Status: DC
  Administered 2023-06-20 – 2023-06-27 (×16): 750 mg via ORAL
  Filled 2023-06-20 (×18): qty 1

## 2023-06-20 MED ORDER — TRAMADOL HCL 50 MG PO TABS: 50.0000 mg | ORAL_TABLET | Freq: Four times a day (QID) | ORAL | Status: DC | PRN

## 2023-06-20 MED ORDER — POTASSIUM CHLORIDE 10 MEQ/100ML IV SOLN: 10.0000 meq | INTRAVENOUS | Status: AC

## 2023-06-20 MED ORDER — DULOXETINE HCL 30 MG PO CPEP
30.0000 mg | ORAL_CAPSULE | Freq: Every day | ORAL | Status: DC
  Administered 2023-06-20 – 2023-06-21 (×2): 30 mg via ORAL
  Filled 2023-06-20 (×3): qty 1

## 2023-06-20 MED ORDER — FAMOTIDINE 20 MG PO TABS
20.0000 mg | ORAL_TABLET | Freq: Two times a day (BID) | ORAL | Status: DC
  Administered 2023-06-20 – 2023-06-22 (×5): 20 mg via ORAL
  Filled 2023-06-20 (×5): qty 1

## 2023-06-20 MED ORDER — AMITRIPTYLINE HCL 50 MG PO TABS
100.0000 mg | ORAL_TABLET | Freq: Every day | ORAL | Status: DC
  Administered 2023-06-20 – 2023-06-27 (×8): 100 mg via ORAL
  Filled 2023-06-20 (×8): qty 2

## 2023-06-20 MED ORDER — ALPRAZOLAM 0.5 MG PO TABS
0.5000 mg | ORAL_TABLET | Freq: Three times a day (TID) | ORAL | Status: DC | PRN
  Administered 2023-06-20: 0.5 mg via ORAL
  Filled 2023-06-20: qty 1

## 2023-06-20 MED ORDER — GABAPENTIN 300 MG PO CAPS
300.0000 mg | ORAL_CAPSULE | Freq: Two times a day (BID) | ORAL | Status: DC
  Administered 2023-06-20 – 2023-06-27 (×16): 300 mg via ORAL
  Filled 2023-06-20 (×17): qty 1

## 2023-06-20 MED ORDER — FAMOTIDINE IN NACL 20-0.9 MG/50ML-% IV SOLN: 20.0000 mg | Freq: Two times a day (BID) | INTRAVENOUS | Status: DC

## 2023-06-20 MED ORDER — MIDODRINE HCL 5 MG PO TABS
5.0000 mg | ORAL_TABLET | Freq: Three times a day (TID) | ORAL | Status: DC
Start: 2023-06-20 — End: 2023-06-24
  Administered 2023-06-20 – 2023-06-24 (×13): 5 mg via ORAL
  Filled 2023-06-20 (×13): qty 1

## 2023-06-20 MED ORDER — THIAMINE MONONITRATE 100 MG PO TABS
100.0000 mg | ORAL_TABLET | Freq: Every day | ORAL | Status: DC
  Administered 2023-06-20 – 2023-06-27 (×8): 100 mg via ORAL
  Filled 2023-06-20 (×9): qty 1

## 2023-06-20 MED ORDER — ACETAMINOPHEN 500 MG PO TABS
1000.0000 mg | ORAL_TABLET | Freq: Three times a day (TID) | ORAL | Status: DC | PRN
  Administered 2023-06-20 (×2): 1000 mg via ORAL
  Filled 2023-06-20 (×2): qty 2

## 2023-06-20 NOTE — Consult Note (Signed)
WOC Nurse ostomy consult note; this patient is well known to WOC team from original placement of ileostomy and multiple admissions d/t high output and skin breakdown  Stoma type/location:  RMQ ileostomy  Stomal assessment/size:  1 1/8" slightly above skin level, pink moist, productive of brown liquid effluent  Peristomal assessment:  erythema with weeping denuded skin (much less so than on previous visits)  ICD-10 CM Codes for Irritant Dermatitis L24B3 - Related to fecal or urinary stoma or fistula  Treatment options for stomal/peristomal skin: crusted with stoma powder Hart [place] #6) and no sting barrier wipe  Output approximately 50 mls liquid effluent in pouch  Ostomy pouching: 2 piece convex; 2 1/4" soft convex skin barrier Hart [place] [phone]), 2 1/4" high output pouch [place] [phone]) and 2" barrier ring Hart [place] [phone])  Education provided: none, patient has had extensive education on multiple occasions. Overall, [name] seems to have taken some ownership of her ileostomy, says she has been researching on You Tube and appears to understand crusting the skin, using convexity and high output pouches if her stool is liquid.    I removed current pouch, cleaned around stoma with water moistened washcloth, crusted with stoma powder and no sting barrier wipe. I stretched a 2" barrier ring to fit around stoma, cut a skin barrier to 1 1/8" round and placed on top of 2" barrier ring. I snapped on a high output pouch and bedside nurse attached to bedside drainage bag.  This will help keep pouch empty if stool remains liquid.     Enrolled patient in [employer] DC program: no, established ostomy ordering through [facility] per patient.   I ordered (6) sets of 2 1/4" skin barrier Hart [place] [phone]), 2 1/4" high output pouch Hart [place] [phone]) and 2" barrier rings Hart [place] [phone]) for room. Also ordered stoma powder Hart [place] #6 for room as patient will require crusting with stoma powder at each pouch change.    Procedure for crusting:  Sprinkle stoma powder  over any irritated skin, brush away excess powder Tap lightly over the powder with Cavilon No-Sting barrier wipe (skin barrier wipe-white and blue package) Allow to dry Apply another layer of powder following the same steps up to three layers.  After dry proceed with routine pouching    WOC team will not follow. Re-consult if further needs arise.   Thank you,    Priscella Mann MSN, RN-BC, [employer] [phone]

## 2023-06-20 NOTE — Progress Notes (Addendum)
eLink Physician-Brief Progress Note Patient Name: [name] DOB: May 01, 1971 MRN: [id number]   Date of Service  06/20/2023  HPI/Events of Note  Patient complaining about generalized pain.  Admitted for hypotension/sepsis of unknown etiology.  Has been on norepinephrine through a peripheral IV which has infiltrated x 2.  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  eICU Interventions  Patient's chart reviewed.  Video assessment of patient done.  Case discussed with patient's nurse who is at patient's bedside. Tylenol has been ordered for pain. With regard to her hypotension, patient usually runs low with systolic blood pressures in the 80s and 90s at home.  She is on midodrine as an outpatient but is also on metoprolol for SVTs.  Currently she is asymptomatic with her blood pressure in the 80s.  Will observe without any vasopressor or midodrine for now.  If she becomes symptomatic or more hypotensive, consideration will be given to restarting midodrine.     Intervention Category Evaluation Type: Other  [name] 06/20/2023, 1:58 AM --------------------------------- Magnesium and low normal potassium on a.m. labs. Replacement ordered.

## 2023-06-20 NOTE — Progress Notes (Addendum)
NAME:  [name], MRN:  [id number], DOB:  Oct 21, 1970, LOS: 1 ADMISSION DATE:  06/19/2023, CONSULTATION DATE:  06/19/2023 REFERRING MD:  Dr. Pilar Plate - EDP, CHIEF COMPLAINT:  Sepsis   History of Present Illness:  [name] is a 53 y.o. female transferred to [name] ICU from [name] for hypotension. She reportedly presented to [name] with altered mental status and hypotension. Of note, lactic acid level was normal. She has a history of previous presentations with altered mental status due to accidental overdoses of narcotic analgesics. So, Narcan was administered without significant clinical benefit. She was given IV fluids and started on norepinephrine infusion. Transfer was requested to [name] with a reported impression of septic shock, perhaps related to cellulitis at a leaky ostomy site. On exam, the patient appears pale and dry. Arousable but lethargic. Lungs clear. Heart sounds regular. Abdomen is free of tenderness or rebound. Skin exhibits some tenting.  Lab work significant for Na 132, creatinine 1.55, albumin 3.3, initial lactic 1.5 with uptrend to 2.5 while in ED, WBC 12.4, hemoglobin 10.2, platelets 586.  UA unremarkable.  CT head and CT abd/pelvis both negative for acute abnormalities.  Given hypotension on arrival, patient was started on low-dose peripheral Levophed, and PCCM was asked to admit.  Pertinent  Medical History  hypertension, hyperlipidemia, type 2 diabetes, COPD, seizure disorder, fungal endocarditis (2024) and anxiety.   Significant Hospital Events: Including procedures, antibiotic start and stop dates in addition to other pertinent events   2/16 admitted for complaints of malfunctioning colostomy, signs of sepsis on ED arrival.  Currently on low-dose peripheral Levophed  Interim History / Subjective:  Off norpeinephrine.  MAP 70s.  Much more awake.  Thirsty.  Objective   Blood pressure (!) 96/57, pulse 89, temperature 98.8 F (37.1 C),  temperature source Oral, resp. rate 14, height 5\' 4"  (1.626 m), weight 55 kg, SpO2 97%.        Intake/Output Summary (Last 24 hours) at 06/20/2023 0805 Last data filed at 06/20/2023 0700 Gross per 24 hour  Intake 2895.79 ml  Output 2200 ml  Net 695.79 ml   Filed Weights   06/19/23 0205  Weight: 55 kg    Examination: General: Acute on chronic ill-appearing thin middle-aged female lying in bed in no acute distress HEENT: [place]/AT, MM pink/moist, PERRL,  Neuro: Lethargic but arousable, altered, seen moving all extremities spontaneously CV: s1s2 regular rate and rhythm, no murmur, rubs, or gallops,  PULM: Clear to auscultation bilaterally, no increased work of breathing, no added breath sounds GI: soft, bowel sounds active in all 4 quadrants, non-tender, non-distended, right lower quadrant ostomy pink Extremities: warm/dry, no edema  Skin: no rashes or lesions  Resolved Hospital Problem list     Assessment & Plan:  Hypotension/Shock, multifactorial: dehydration/hypovolemia, adverse effect of medication, improved -suspect benzodiazepine overdose (Xanax) -Patient presented with hypothermia and hypotension with associated initial lactate of 1.5 with uptrend to 2.5 leukocytosis with WBC 12.4 -CT abdomen negative -Per chart review it appears patient has multiple admissions for dehydration resultant AKI given high output ostomy.  She appears volume depleted on admission Given limited IV access at this time, encourage PO intake.  May need PICC placement. Follow cultures Procalcitonin < 0.10 (2/16). Monitor urine output Lactic acid peaked at 2.5, now normal (1.1). OK to transfer out of ICU from pulmonary/critical care standpoint.  Acute Kidney Injury, improved -Creatinine on admission 1.55 with GFR 40 compared to creatinine 0.94 with GFR greater than 6  on 05/12/23 P: Follow renal function  Monitor urine output Trend Bmet Avoid nephrotoxins Ensure adequate renal perfusion  IV  hydration  Hypomagnesemia Replete. Famotidine for GI prophylaxis.  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 History of Crohn's disease -Patient has had multiple admissions secondary to dehydration and AKI in the setting of high output ostomy  P: Continue IV hydration Monitor urine output Follow ostomy output Strict intake and output  Normocytic anemia, multifactorial Certainly with an acute dilutional component and anemia of chronic disease Monitor hemoglobin  Essential HTN HLD -Home medication includes Eliquis and metoprolol -Unsure rationale for chronic anticoagulation P: Hold anticoagulation currently Hold antihypertensives given hypotension currently Imagery  History of right upper extremity DVT in the setting of PICC line October 2024 -Unsure indication of why patient remains anticoagulated P: At this time hold anticoagulation until repeat CBC is completed, patient appears very pale on exam  Seizure disorder -Medications include Keppra 750 mg twice daily P: Continue home Keppra Seizure precautions  Chronic pain -On Xanax, Elavil, Cymbalta, Neurontin, Dilaudid at home. P: Will start reintroducing home medications but withholding Xanax and Dilaudid for now.   Best Practice (right click and "Reselect all SmartList Selections" daily)   Diet/type: Regular consistency (see orders) DVT prophylaxis LMWH Pressure ulcer(s): N/A GI prophylaxis: H2B Lines: N/A Foley:  Yes, and it is still needed Code Status:  full code Last date of multidisciplinary goals of care discussion: Pending   Labs   CBC: Recent Labs  Lab 06/19/23 0238 06/19/23 1201 06/20/23 0248  WBC 12.4* 10.9* 9.3  HGB 10.2* 10.9* 8.5*  HCT 30.4* 33.1* 25.4*  MCV 84.7 84.4 84.1  PLT 586* 666* 540*    Basic Metabolic Panel: Recent Labs  Lab 06/19/23 0238  06/20/23 0248  NA 132* 135  K 3.9 3.5  CL 99 106  CO2 22 19*  GLUCOSE 89 82  BUN 14 6  CREATININE 1.55* 1.10*  CALCIUM 8.8* 8.0*  MG  --  0.9*  PHOS  --  3.9   GFR: Estimated Creatinine Clearance: 50.5 mL/min (A) (by C-G formula based on SCr of 1.1 mg/dL (H)). Recent Labs  Lab 06/19/23 0238 06/19/23 0418 06/19/23 1201 06/20/23 0248  PROCALCITON  --   --  <0.10  --   WBC 12.4*  --  10.9* 9.3  LATICACIDVEN 1.5 2.5* 1.1  --     Liver Function Tests: Recent Labs  Lab 06/19/23 0238  AST 24  ALT 15  ALKPHOS 95  BILITOT 0.3  PROT 6.5  ALBUMIN 3.3*   Recent Labs  Lab 06/19/23 0238  LIPASE 32   No results for input(s): "AMMONIA" in the last 168 hours.  ABG    Component Value Date/Time   PHART 7.314 (L) 02/11/2023 1633   PCO2ART 36.9 02/11/2023 1633   PO2ART 130 (H) 02/11/2023 1633   HCO3 18.7 (L) 02/11/2023 1633   TCO2 20 (L) 02/11/2023 1633   ACIDBASEDEF 7.0 (H) 02/11/2023 1633   O2SAT 99 02/11/2023 1633     Coagulation Profile: Recent Labs  Lab 06/19/23 0238  INR 1.0    Cardiac Enzymes: No results for input(s): "CKTOTAL", "CKMB", "CKMBINDEX", "TROPONINI" in the last 168 hours.  HbA1C: Hgb A1c MFr Bld  Date/Time Value Ref Range Status  01/27/2023 11:43 AM 5.8 (H) 4.8 - 5.6 % Final    Comment:    (NOTE) Pre diabetes:  5.7%-6.4%  Diabetes:              >6.4%  Glycemic control for   <7.0% adults with diabetes   12/23/2021 03:29 PM 4.7 (L) 4.8 - 5.6 % Final    Comment:    (NOTE) Pre diabetes:          5.7%-6.4%  Diabetes:              >6.4%  Glycemic control for   <7.0% adults with diabetes     CBG: Recent Labs  Lab 06/19/23 0652 06/19/23 0925 06/19/23 1156 06/19/23 1530  GLUCAP 70 94 90 95    Review of Systems:   Unable to assess   Past Medical History:  She,  has a past medical history of Anxiety, Asthma, Chronic low back pain, Collagen vascular disease (HCC), COPD (chronic obstructive pulmonary disease) (HCC),  Crohn's disease (HCC), Fungal endocarditis (03/28/2023), GERD (gastroesophageal reflux disease), History of head injury, Hypertension, IBS (irritable bowel syndrome), PSVT (paroxysmal supraventricular tachycardia) (HCC), PTSD (post-traumatic stress disorder), Recurrent chest pain, Seizure disorder (HCC), and Type 2 diabetes mellitus (HCC).   Surgical History:   Past Surgical History:  Procedure Laterality Date   ABDOMINAL HYSTERECTOMY     with right salpingo oophorectomy 2005   APPENDECTOMY  2006   BALLOON DILATION N/A 02/19/2021   Procedure: BALLOON DILATION;  Surgeon: Lanelle Bal, DO;  [facility]: [facility];  Service: Endoscopy;  Laterality: N/A;  Sigmoid colon stricture   BIOPSY  02/06/2021   Procedure: BIOPSY;  Surgeon: Lanelle Bal, DO;  [facility]: [facility];  Service: Endoscopy;;   BIOPSY  02/19/2021   Procedure: BIOPSY;  Surgeon: Lanelle Bal, DO;  [facility]: [facility];  Service: Endoscopy;;   BIOPSY  07/15/2021   Procedure: BIOPSY;  Surgeon: Malissa Hippo, MD;  [facility]: [facility];  Service: Endoscopy;;   BIOPSY  04/08/2023   Procedure: BIOPSY;  Surgeon: Corbin Ade, MD;  [facility]: [facility];  Service: Endoscopy;;   CESAREAN SECTION     1996   CHOLECYSTECTOMY     COLECTOMY N/A 02/11/2023   Procedure: TOTAL COLECTOMY;  Surgeon: Harriette Bouillon, MD;  Location: [facility] OR;  Service: General;  Laterality: N/A;   COLONOSCOPY  2010   Dr. Jena Gauss; negative except for hemorrhoids   COLOSTOMY  02/11/2023   Procedure: COLOSTOMY;  Surgeon: Harriette Bouillon, MD;  Location: [facility] OR;  Service: General;;   ESOPHAGEAL DILATION N/A 10/25/2014   Procedure: ESOPHAGEAL DILATION;  Surgeon: Malissa Hippo, MD;  [facility];  Service: Endoscopy;  Laterality: N/A;   ESOPHAGOGASTRODUODENOSCOPY N/A 10/25/2014   Procedure: ESOPHAGOGASTRODUODENOSCOPY (EGD);  Surgeon: Malissa Hippo, MD;  [facility];  Service: Endoscopy;  Laterality: N/A;  1250   FLEXIBLE  SIGMOIDOSCOPY N/A 02/06/2021   Procedure: FLEXIBLE SIGMOIDOSCOPY;  Surgeon: Lanelle Bal, DO;  [facility];  Service: Endoscopy;  Laterality: N/A;   FLEXIBLE SIGMOIDOSCOPY N/A 02/19/2021   Procedure: FLEXIBLE SIGMOIDOSCOPY;  Surgeon: Lanelle Bal, DO;  [facility];  Service: Endoscopy;  Laterality: N/A;   FLEXIBLE SIGMOIDOSCOPY N/A 02/02/2023   Procedure: FLEXIBLE SIGMOIDOSCOPY;  Surgeon: Beverley Fiedler, MD;  Location: [facility] ENDOSCOPY;  Service: Gastroenterology;  Laterality: N/A;   FLEXIBLE SIGMOIDOSCOPY N/A 02/10/2023   Procedure: FLEXIBLE SIGMOIDOSCOPY;  Surgeon: Sherrilyn Rist, MD;  Location: [facility] ENDOSCOPY;  Service: Gastroenterology;  Laterality: N/A;   FLEXIBLE SIGMOIDOSCOPY N/A 04/08/2023   Procedure: FLEXIBLE SIGMOIDOSCOPY;  Surgeon:  Rourk, Gerrit Friends, MD;  [facility];  Service: Endoscopy;  Laterality: N/A;   IR FLUORO GUIDE CV LINE LEFT  02/21/2023   IR US GUIDE VASC ACCESS LEFT  02/21/2023   LAPAROTOMY N/A 02/11/2023   Procedure: EXPLORATORY LAPAROTOMY;  Surgeon: Harriette Bouillon, MD;  Location: [facility];  Service: General;  Laterality: N/A;   OOPHORECTOMY     left for torsion and ovarian fibroma; uterine myoma resected 1995   SIGMOIDOSCOPY  07/15/2021   Procedure: SIGMOIDOSCOPY;  Surgeon: Malissa Hippo, MD;  [facility];  Service: Endoscopy;;   TRANSESOPHAGEAL ECHOCARDIOGRAM (CATH LAB) N/A 02/18/2023   Procedure: TRANSESOPHAGEAL ECHOCARDIOGRAM;  Surgeon: Sande Rives, MD;  Location: [facility] INVASIVE CV LAB;  Service: Cardiovascular;  Laterality: N/A;     Social History:   reports that she has been smoking cigarettes. She started smoking about 38 years ago. She has a 19.4 pack-year smoking history. She has been exposed to tobacco smoke. She has never used smokeless tobacco. She reports current drug use. Drug: Marijuana. She reports that she does not drink alcohol.   Family History:  Her family history includes Cancer in her  mother; Cancer (age of onset: 47) in her father; Crohn's disease in her cousin; Diabetes in her father; Heart attack in her mother; Heart attack (age of onset: 79) in her father; Heart failure in her father and mother; Hypertension in her mother.   Allergies Allergies  Allergen Reactions   Codeine Shortness Of Breath, Swelling and Rash    Throat swelling   Hydrocodone Shortness Of Breath, Swelling and Rash   Ketorolac Nausea And Vomiting, Swelling and Nausea Only    Pt reports n/v and swelling to throat   Morphine And Codeine Shortness Of Breath, Swelling and Rash   Penicillins Shortness Of Breath, Swelling and Other (See Comments)    Throat swells 02/06/21--TOLERATES CEFTRIAXONE     Nickel Rash   Pregabalin Other (See Comments)   Acetaminophen     Per MD patient states she can't take Tylenol because of liver enzymes   Atarax [Hydroxyzine] Itching and Swelling    Mildly swollen throat   Ativan [Lorazepam] Other (See Comments)    Migraines.   Oxycodone-Acetaminophen Nausea Only and Nausea And Vomiting   Strawberry Extract Swelling   Watermelon Concentrate Swelling   Albuterol Palpitations   Benadryl [Diphenhydramine Hcl] Palpitations   Humira [Adalimumab] Rash   Latex Rash   Remicade [Infliximab] Rash    Blisters and Welts    Tramadol Rash    Rash and itching     Home Medications  Prior to Admission medications   Medication Sig Start Date End Date Taking? Authorizing Provider  albuterol (ACCUNEB) 1.25 MG/3ML nebulizer solution Take 1 ampule by nebulization every 6 (six) hours as needed for wheezing or shortness of breath. 04/21/23   [provider]  ALPRAZolam Prudy Feeler) 1 MG tablet Take 1 mg by mouth 3 (three) times daily. 07/11/19   [provider]  amitriptyline (ELAVIL) 100 MG tablet Take 100 mg by mouth at bedtime.    [provider]  apixaban (ELIQUIS) 2.5 MG TABS tablet Take 1 tablet (2.5 mg total) by mouth 2 (two) times daily. 04/25/23    Johnson, Clanford L, MD  cetirizine (ZYRTEC) 10 MG tablet Take 10 mg by mouth daily. 01/08/22   [provider]  DULoxetine (CYMBALTA) 30 MG capsule Take 1 capsule (30 mg total) by mouth daily. 04/25/23   Cleora Fleet, MD  feeding supplement (  ENSURE ENLIVE / ENSURE PLUS) LIQD Take 237 mLs by mouth 2 (two) times daily between meals. 01/04/22   Vassie Loll, MD  gabapentin (NEURONTIN) 300 MG capsule Take 300 mg by mouth 2 (two) times daily. 10/15/22   [provider]  HYDROmorphone (DILAUDID) 2 MG tablet Take 1 tablet (2 mg total) by mouth every 6 (six) hours as needed for severe pain (pain score 7-10). 05/12/23   Sherryll Burger, Pratik D, DO  hydrOXYzine (ATARAX) 50 MG tablet Take 25-50 mg by mouth 3 (three) times daily as needed for anxiety or nausea.    [provider]  ibuprofen (ADVIL) 200 MG tablet Take 200 mg by mouth every 6 (six) hours as needed for mild pain (pain score 1-3).    [provider]  levETIRAcetam (KEPPRA) 750 MG tablet Take 750 mg by mouth 2 (two) times daily. 09/30/20   [provider]  loperamide (IMODIUM) 2 MG capsule Take 1 capsule (2 mg total) by mouth every 6 (six) hours. 04/25/23   Johnson, Clanford L, MD  mesalamine (ROWASA) 4 g enema Place 60 mLs (4 g total) rectally at bedtime. 04/22/23   Rourk, Gerrit Friends, MD  metoprolol tartrate (LOPRESSOR) 25 MG tablet Take 0.5 tablets (12.5 mg total) by mouth 2 (two) times daily. 04/10/23   Johnson, Clanford L, MD  midodrine (PROAMATINE) 5 MG tablet Take 1 tablet (5 mg total) by mouth 3 (three) times daily with meals. 04/10/23   Johnson, Clanford L, MD  nitroGLYCERIN (NITROSTAT) 0.4 MG SL tablet Place 1 tablet (0.4 mg total) under the tongue every 5 (five) minutes as needed for chest pain. 04/25/23   Johnson, Clanford L, MD  nystatin (MYCOSTATIN/NYSTOP) powder Apply topically 3 (three) times daily. 05/12/23   Sherryll Burger, Pratik D, DO  ondansetron (ZOFRAN) 4 MG tablet Take 4 mg by mouth every 8 (eight) hours as  needed. 04/25/23   [provider]  ondansetron (ZOFRAN) 4 MG tablet Take 1 tablet (4 mg total) by mouth every 6 (six) hours as needed for nausea. 05/12/23   Sherryll Burger, Pratik D, DO  pantoprazole (PROTONIX) 40 MG tablet Take 1 tablet (40 mg total) by mouth 2 (two) times daily. 01/03/22   Vassie Loll, MD  prochlorperazine (COMPAZINE) 5 MG tablet Take 1 tablet (5 mg total) by mouth every 6 (six) hours as needed for refractory nausea / vomiting. 05/12/23   Maurilio Lovely D, DO     Critical care time:  Critical care time: 30 minutes.  The treatment and management of the patient's condition was required based on the threat of imminent deterioration. This time reflects time spent by the physician evaluating, providing care and managing the critically ill patient's care. The time was spent at the immediate bedside (or on the same floor/unit and dedicated to this patient's care). Time involved in separately billable procedures is NOT included int he critical care time indicated above. Family meeting and update time may be included above if and only if the patient is unable/incompetent to participate in clinical interview and/or decision making, and the discussion was necessary to determining treatment decisions.  Marcelle Smiling, MD Board Certified by the ABIM, Pulmonary Diseases & Critical Care Medicine  Personal contact information can be found on Amion  If no contact or response made please call [phone] 06/20/2023, 8:05 AM

## 2023-06-20 NOTE — Progress Notes (Addendum)
eLink Physician-Brief Progress Note Patient Name: [name] DOB: 1970/12/03 MRN: [id number]   Date of Service  06/20/2023  HPI/Events of Note  Received request for Gerhardt's butt cream.   eICU Interventions  Cream ordered as per request.      Intervention Category Minor Interventions: Other:  Larinda Buttery 06/20/2023, 8:46 PM  11:23 PM Pt requesting for pain medication for headache.  She has an extensive list of intolerance to medications.  Asked the patient over the camera and she was willing to take ibuprofen.   BP 109/72, HR 97, RR 16, O2 sats 99%.   Plan> Ibuprofen PRN ordered.

## 2023-06-20 NOTE — H&P (Deleted)
NAME:  Gwynn Chalker, MRN:  [id number], DOB:  [birth year], LOS: 1 ADMISSION DATE:  06/19/2023, CONSULTATION DATE:  06/19/2023 REFERRING MD:  Dr. Pilar Plate - EDP, CHIEF COMPLAINT:  Sepsis   History of Present Illness:  Joyia Martia Dalby is a 53 y.o. female transferred to [name] ICU from [facility] for hypotension. She reportedly presented to [facility] with altered mental status and hypotension. Of note, lactic acid level was normal. She has a history of previous presentations with altered mental status due to accidental overdoses of narcotic analgesics. So, Narcan was administered without significant clinical benefit. She was given IV fluids and started on norepinephrine infusion. Transfer was requested to [facility] with a reported impression of septic shock, perhaps related to cellulitis at a leaky ostomy site. On exam, the patient appears pale and dry. Arousable but lethargic. Lungs clear. Heart sounds regular. Abdomen is free of tenderness or rebound. Skin exhibits some tenting.  Lab work significant for Na 132, creatinine 1.55, albumin 3.3, initial lactic 1.5 with uptrend to 2.5 while in ED, WBC 12.4, hemoglobin 10.2, platelets 586.  UA unremarkable.  CT head and CT abd/pelvis both negative for acute abnormalities.  Given hypotension on arrival, patient was started on low-dose peripheral Levophed, and PCCM was asked to admit.  Pertinent  Medical History  hypertension, hyperlipidemia, type 2 diabetes, COPD, seizure disorder, fungal endocarditis (2024) and anxiety.   Significant Hospital Events: Including procedures, antibiotic start and stop dates in addition to other pertinent events   2/16 admitted for complaints of malfunctioning colostomy, signs of sepsis on ED arrival.  Currently on low-dose peripheral Levophed  Interim History / Subjective:  Currently on norepinephrine.  MAP 66.  Much more awake.  Thirsty.  Objective   Blood pressure (!) 85/47, pulse 88, temperature 97.8 F  (36.6 C), temperature source Oral, resp. rate 15, height 5\' 4"  (1.626 m), weight 55 kg, SpO2 97%.        Intake/Output Summary (Last 24 hours) at 06/20/2023 1914 Last data filed at 06/20/2023 0700 Gross per 24 hour  Intake 2895.79 ml  Output 2200 ml  Net 695.79 ml   Filed Weights   06/19/23 0205  Weight: 55 kg    Examination: General: Acute on chronic ill-appearing thin middle-aged female lying in bed in no acute distress HEENT: [place]/AT, MM pink/moist, PERRL,  Neuro: Lethargic but arousable, altered, seen moving all extremities spontaneously CV: s1s2 regular rate and rhythm, no murmur, rubs, or gallops,  PULM: Clear to auscultation bilaterally, no increased work of breathing, no added breath sounds GI: soft, bowel sounds active in all 4 quadrants, non-tender, non-distended, right lower quadrant ostomy pink Extremities: warm/dry, no edema  Skin: no rashes or lesions  Resolved Hospital Problem list     Assessment & Plan:  Hypotension/Hypovolemic shock, improved -Patient presented with hypothermia and hypotension with associated initial lactate of 1.5 with uptrend to 2.5 leukocytosis with WBC 12.4 -CT abdomen negative -Per chart review it appears patient has multiple admissions for dehydration resultant AKI given high output ostomy.  She appears volume depleted on admission Given limited IV access at this time, encourage PO intake.  May need PICC placement. Follow cultures Procalcitonin < 0.10 (2/16). Monitor urine output Lactic acid peaked at 2.5, now normal (1.1).  Acute Kidney Injury, improved -Creatinine on admission 1.55 with GFR 40 compared to creatinine 0.94 with GFR greater than 6 on 05/12/23 P: Follow renal function  Monitor urine output Trend Bmet Avoid nephrotoxins Ensure adequate renal perfusion  IV hydration  Hypomagnesemia Replete. Famotidine for GI prophylaxis.  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 History of Crohn's disease -Patient has had multiple admissions secondary to dehydration and AKI in the setting of high output ostomy  P: Continue IV hydration Monitor urine output Follow ostomy output Strict intake and output  Normocytic anemia, multifactorial Certainly with an acute dilutional component and anemia of chronic disease Monitor hemoglobin  Essential HTN HLD -Home medication includes Eliquis and metoprolol -Unsure rationale for chronic anticoagulation P: Hold anticoagulation currently Hold antihypertensives given hypotension currently Imagery  History of right upper extremity DVT in the setting of PICC line October 2024 -Unsure indication of why patient remains anticoagulated P: At this time hold anticoagulation until repeat CBC is completed, patient appears very pale on exam  Seizure disorder -Medications include Keppra 750 mg twice daily P: Continue home Keppra Seizure precautions  Chronic pain -Multiple opioids and benzodiazepines on more: P: Hold home medication as patient is currently minimally  arousable   Best Practice (right click and "Reselect all SmartList Selections" daily)   Diet/type: Regular consistency (see orders) DVT prophylaxis LMWH Pressure ulcer(s): N/A GI prophylaxis: H2B Lines: N/A Foley:  Yes, and it is still needed Code Status:  full code Last date of multidisciplinary goals of care discussion: Pending   Labs   CBC: Recent Labs  Lab 06/19/23 0238 06/19/23 1201 06/20/23 0248  WBC 12.4* 10.9* 9.3  HGB 10.2* 10.9* 8.5*  HCT 30.4* 33.1* 25.4*  MCV 84.7 84.4 84.1  PLT 586* 666* 540*    Basic Metabolic Panel: Recent Labs  Lab 06/19/23 0238 06/20/23 0248  NA 132* 135  K 3.9 3.5  CL 99 106  CO2 22 19*  GLUCOSE 89 82  BUN 14 6  CREATININE 1.55* 1.10*  CALCIUM 8.8* 8.0*  MG  --  0.9*  PHOS  --  3.9   GFR: Estimated  Creatinine Clearance: 50.5 mL/min (A) (by C-G formula based on SCr of 1.1 mg/dL (H)). Recent Labs  Lab 06/19/23 0238 06/19/23 0418 06/19/23 1201 06/20/23 0248  PROCALCITON  --   --  <0.10  --   WBC 12.4*  --  10.9* 9.3  LATICACIDVEN 1.5 2.5* 1.1  --     Liver Function Tests: Recent Labs  Lab 06/19/23 0238  AST 24  ALT 15  ALKPHOS 95  BILITOT 0.3  PROT 6.5  ALBUMIN 3.3*   Recent Labs  Lab 06/19/23 0238  LIPASE 32   No results for input(s): "AMMONIA" in the last 168 hours.  ABG    Component Value Date/Time   PHART 7.314 (L) 02/11/2023 1633   PCO2ART 36.9 02/11/2023 1633   PO2ART 130 (H) 02/11/2023 1633   HCO3 18.7 (L) 02/11/2023 1633   TCO2 20 (L) 02/11/2023 1633   ACIDBASEDEF 7.0 (H) 02/11/2023 1633   O2SAT 99 02/11/2023 1633     Coagulation Profile: Recent Labs  Lab 06/19/23 0238  INR 1.0    Cardiac Enzymes: No results for input(s): "CKTOTAL", "CKMB", "CKMBINDEX", "TROPONINI" in the last 168 hours.  HbA1C: Hgb A1c MFr Bld  Date/Time Value Ref Range Status  01/27/2023 11:43 AM 5.8 (H) 4.8 - 5.6 % Final    Comment:    (NOTE) Pre diabetes:          5.7%-6.4%  Diabetes:              >6.4%  Glycemic control for   <7.0% adults with diabetes   12/23/2021 03:29 PM 4.7 (L) 4.8 - 5.6 % Final    Comment:    (NOTE) Pre diabetes:          5.7%-6.4%  Diabetes:              >6.4%  Glycemic control for   <7.0% adults with diabetes     CBG: Recent Labs  Lab 06/19/23 0652 06/19/23 0925 06/19/23 1156 06/19/23 1530  GLUCAP 70 94 90 95    Review of Systems:   Unable to assess   Past Medical History:  She,  has a past medical history of Anxiety, Asthma, Chronic low back pain, Collagen vascular disease (HCC), COPD (chronic obstructive pulmonary disease) (HCC), Crohn's disease (HCC), Fungal endocarditis (03/28/2023), GERD (gastroesophageal reflux disease), History of head injury, Hypertension, IBS (irritable bowel syndrome), PSVT (paroxysmal  supraventricular tachycardia) (HCC), PTSD (post-traumatic stress disorder), Recurrent chest pain, Seizure disorder (HCC), and Type 2 diabetes mellitus (HCC).   Surgical History:   Past Surgical History:  Procedure Laterality Date   ABDOMINAL HYSTERECTOMY     with right salpingo oophorectomy 2005   APPENDECTOMY  2006   BALLOON DILATION N/A 02/19/2021   Procedure: BALLOON DILATION;  Surgeon: Lanelle Bal, DO;  [facility]: [facility];  Service: Endoscopy;  Laterality: N/A;  Sigmoid colon stricture   BIOPSY  02/06/2021   Procedure: BIOPSY;  Surgeon: Lanelle Bal, DO;  [facility]: [facility];  Service: Endoscopy;;   BIOPSY  02/19/2021   Procedure: BIOPSY;  Surgeon: Lanelle Bal, DO;  [facility]: [facility];  Service: Endoscopy;;   BIOPSY  07/15/2021   Procedure: BIOPSY;  Surgeon: Malissa Hippo, MD;  [facility]: [facility];  Service: Endoscopy;;   BIOPSY  04/08/2023   Procedure: BIOPSY;  Surgeon: Corbin Ade, MD;  [facility]: [facility];  Service: Endoscopy;;   CESAREAN SECTION     1996   CHOLECYSTECTOMY     COLECTOMY N/A 02/11/2023   Procedure: TOTAL COLECTOMY;  Surgeon: Harriette Bouillon, MD;  Location: [facility] OR;  Service: General;  Laterality: N/A;   COLONOSCOPY  2010   Dr. Jena Gauss; negative except for hemorrhoids   COLOSTOMY  02/11/2023   Procedure: COLOSTOMY;  Surgeon: Harriette Bouillon, MD;  Location: [facility] OR;  Service: General;;   ESOPHAGEAL DILATION N/A 10/25/2014   Procedure: ESOPHAGEAL DILATION;  Surgeon: Malissa Hippo, MD;  [facility];  Service: Endoscopy;  Laterality: N/A;   ESOPHAGOGASTRODUODENOSCOPY N/A 10/25/2014   Procedure: ESOPHAGOGASTRODUODENOSCOPY (EGD);  Surgeon: Malissa Hippo, MD;  [facility];  Service: Endoscopy;  Laterality: N/A;  1250   FLEXIBLE SIGMOIDOSCOPY N/A 02/06/2021   Procedure: FLEXIBLE SIGMOIDOSCOPY;  Surgeon: Lanelle Bal, DO;  [facility];  Service: Endoscopy;  Laterality: N/A;   FLEXIBLE  SIGMOIDOSCOPY N/A 02/19/2021   Procedure: FLEXIBLE SIGMOIDOSCOPY;  Surgeon: Lanelle Bal, DO;  [facility];  Service: Endoscopy;  Laterality: N/A;   FLEXIBLE SIGMOIDOSCOPY N/A 02/02/2023   Procedure: FLEXIBLE SIGMOIDOSCOPY;  Surgeon: Beverley Fiedler, MD;  Location: [facility] ENDOSCOPY;  Service: Gastroenterology;  Laterality: N/A;   FLEXIBLE SIGMOIDOSCOPY N/A 02/10/2023   Procedure: FLEXIBLE SIGMOIDOSCOPY;  Surgeon: Sherrilyn Rist, MD;  Location: [facility] ENDOSCOPY;  Service: Gastroenterology;  Laterality: N/A;   FLEXIBLE SIGMOIDOSCOPY N/A 04/08/2023   Procedure: FLEXIBLE SIGMOIDOSCOPY;  Surgeon: Corbin Ade, MD;  [facility];  Service: Endoscopy;  Laterality: N/A;   IR  FLUORO GUIDE CV LINE LEFT  02/21/2023   IR US GUIDE VASC ACCESS LEFT  02/21/2023   LAPAROTOMY N/A 02/11/2023   Procedure: EXPLORATORY LAPAROTOMY;  Surgeon: Harriette Bouillon, MD;  Location: [facility];  Service: General;  Laterality: N/A;   OOPHORECTOMY     left for torsion and ovarian fibroma; uterine myoma resected 1995   SIGMOIDOSCOPY  07/15/2021   Procedure: SIGMOIDOSCOPY;  Surgeon: Malissa Hippo, MD;  [facility];  Service: Endoscopy;;   TRANSESOPHAGEAL ECHOCARDIOGRAM (CATH LAB) N/A 02/18/2023   Procedure: TRANSESOPHAGEAL ECHOCARDIOGRAM;  Surgeon: Sande Rives, MD;  Location: [facility] INVASIVE CV LAB;  Service: Cardiovascular;  Laterality: N/A;     Social History:   reports that she has been smoking cigarettes. She started smoking about 38 years ago. She has a 19.4 pack-year smoking history. She has been exposed to tobacco smoke. She has never used smokeless tobacco. She reports current drug use. Drug: Marijuana. She reports that she does not drink alcohol.   Family History:  Her family history includes Cancer in her mother; Cancer (age of onset: 58) in her father; Crohn's disease in her cousin; Diabetes in her father; Heart attack in her mother; Heart attack (age of onset: 85) in her father;  Heart failure in her father and mother; Hypertension in her mother.   Allergies Allergies  Allergen Reactions   Codeine Shortness Of Breath, Swelling and Rash    Throat swelling   Hydrocodone Shortness Of Breath, Swelling and Rash   Ketorolac Nausea And Vomiting, Swelling and Nausea Only    Pt reports n/v and swelling to throat   Morphine And Codeine Shortness Of Breath, Swelling and Rash   Penicillins Shortness Of Breath, Swelling and Other (See Comments)    Throat swells 02/06/21--TOLERATES CEFTRIAXONE     Nickel Rash   Pregabalin Other (See Comments)   Acetaminophen     Per MD patient states she can't take Tylenol because of liver enzymes   Atarax [Hydroxyzine] Itching and Swelling    Mildly swollen throat   Ativan [Lorazepam] Other (See Comments)    Migraines.   Oxycodone-Acetaminophen Nausea Only and Nausea And Vomiting   Strawberry Extract Swelling   Watermelon Concentrate Swelling   Albuterol Palpitations   Benadryl [Diphenhydramine Hcl] Palpitations   Humira [Adalimumab] Rash   Latex Rash   Remicade [Infliximab] Rash    Blisters and Welts    Tramadol Rash    Rash and itching     Home Medications  Prior to Admission medications   Medication Sig Start Date End Date Taking? Authorizing Provider  albuterol (ACCUNEB) 1.25 MG/3ML nebulizer solution Take 1 ampule by nebulization every 6 (six) hours as needed for wheezing or shortness of breath. 04/21/23   [provider]  ALPRAZolam Prudy Feeler) 1 MG tablet Take 1 mg by mouth 3 (three) times daily. 07/11/19   [provider]  amitriptyline (ELAVIL) 100 MG tablet Take 100 mg by mouth at bedtime.    [provider]  apixaban (ELIQUIS) 2.5 MG TABS tablet Take 1 tablet (2.5 mg total) by mouth 2 (two) times daily. 04/25/23   Johnson, Clanford L, MD  cetirizine (ZYRTEC) 10 MG tablet Take 10 mg by mouth daily. 01/08/22   [provider]  DULoxetine (CYMBALTA) 30 MG capsule Take 1 capsule (30 mg total)  by mouth daily. 04/25/23   Johnson, Clanford L, MD  feeding supplement (ENSURE ENLIVE / ENSURE PLUS) LIQD Take 237 mLs by mouth 2 (two) times daily between meals. 01/04/22  Vassie Loll, MD  gabapentin (NEURONTIN) 300 MG capsule Take 300 mg by mouth 2 (two) times daily. 10/15/22   [provider]  HYDROmorphone (DILAUDID) 2 MG tablet Take 1 tablet (2 mg total) by mouth every 6 (six) hours as needed for severe pain (pain score 7-10). 05/12/23   Sherryll Burger, Pratik D, DO  hydrOXYzine (ATARAX) 50 MG tablet Take 25-50 mg by mouth 3 (three) times daily as needed for anxiety or nausea.    [provider]  ibuprofen (ADVIL) 200 MG tablet Take 200 mg by mouth every 6 (six) hours as needed for mild pain (pain score 1-3).    [provider]  levETIRAcetam (KEPPRA) 750 MG tablet Take 750 mg by mouth 2 (two) times daily. 09/30/20   [provider]  loperamide (IMODIUM) 2 MG capsule Take 1 capsule (2 mg total) by mouth every 6 (six) hours. 04/25/23   Johnson, Clanford L, MD  mesalamine (ROWASA) 4 g enema Place 60 mLs (4 g total) rectally at bedtime. 04/22/23   Rourk, Gerrit Friends, MD  metoprolol tartrate (LOPRESSOR) 25 MG tablet Take 0.5 tablets (12.5 mg total) by mouth 2 (two) times daily. 04/10/23   Johnson, Clanford L, MD  midodrine (PROAMATINE) 5 MG tablet Take 1 tablet (5 mg total) by mouth 3 (three) times daily with meals. 04/10/23   Johnson, Clanford L, MD  nitroGLYCERIN (NITROSTAT) 0.4 MG SL tablet Place 1 tablet (0.4 mg total) under the tongue every 5 (five) minutes as needed for chest pain. 04/25/23   Johnson, Clanford L, MD  nystatin (MYCOSTATIN/NYSTOP) powder Apply topically 3 (three) times daily. 05/12/23   Sherryll Burger, Pratik D, DO  ondansetron (ZOFRAN) 4 MG tablet Take 4 mg by mouth every 8 (eight) hours as needed. 04/25/23   [provider]  ondansetron (ZOFRAN) 4 MG tablet Take 1 tablet (4 mg total) by mouth every 6 (six) hours as needed for nausea. 05/12/23   Sherryll Burger, Pratik D, DO   pantoprazole (PROTONIX) 40 MG tablet Take 1 tablet (40 mg total) by mouth 2 (two) times daily. 01/03/22   Vassie Loll, MD  prochlorperazine (COMPAZINE) 5 MG tablet Take 1 tablet (5 mg total) by mouth every 6 (six) hours as needed for refractory nausea / vomiting. 05/12/23   Maurilio Lovely D, DO     Critical care time:  Critical care time: 30 minutes.  The treatment and management of the patient's condition was required based on the threat of imminent deterioration. This time reflects time spent by the physician evaluating, providing care and managing the critically ill patient's care. The time was spent at the immediate bedside (or on the same floor/unit and dedicated to this patient's care). Time involved in separately billable procedures is NOT included int he critical care time indicated above. Family meeting and update time may be included above if and only if the patient is unable/incompetent to participate in clinical interview and/or decision making, and the discussion was necessary to determining treatment decisions.  Marcelle Smiling, MD Board Certified by the ABIM, Pulmonary Diseases & Critical Care Medicine  Personal contact information can be found on Amion  If no contact or response made please call [phone] 06/20/2023, 7:21 AM

## 2023-06-20 NOTE — Progress Notes (Signed)
Had to turn off Levophed.  Patient complained about pain at IV site.  Blood return noted and flushed without any issues.  Notified Elink.  Waiting on doc for new orders.

## 2023-06-20 NOTE — Plan of Care (Signed)

## 2023-06-20 NOTE — Progress Notes (Signed)
PICC order noted, patient is an interventional radiology only placement.   Secure chat sent to bedside nurse with this information.

## 2023-06-20 NOTE — Progress Notes (Signed)
SLP Cancellation Note  Patient Details Name: [name] MRN: [id number] DOB: [birth year]   Cancelled treatment:       Reason Eval/Treat Not Completed: SLP screened, no needs identified, will sign off.  Per discussion with RN, pt passed nursing swallow screen and a regular diet order has been placed. RN reported no swallowing concerns at this time and does not think that a formal clinical swallow evaluation is needed. SLP will sign off. Please re-consult SLP team if pt exhibits concerns for aspiration with PO intake.    Ellery Plunk 06/20/2023, 7:26 AM

## 2023-06-21 ENCOUNTER — Inpatient Hospital Stay (HOSPITAL_COMMUNITY): Payer: Medicaid Other

## 2023-06-21 DIAGNOSIS — R571 Hypovolemic shock: Principal | ICD-10-CM

## 2023-06-21 DIAGNOSIS — G894 Chronic pain syndrome: Secondary | ICD-10-CM

## 2023-06-21 DIAGNOSIS — G928 Other toxic encephalopathy: Secondary | ICD-10-CM

## 2023-06-21 DIAGNOSIS — Z86718 Personal history of other venous thrombosis and embolism: Secondary | ICD-10-CM

## 2023-06-21 DIAGNOSIS — T424X1A Poisoning by benzodiazepines, accidental (unintentional), initial encounter: Secondary | ICD-10-CM

## 2023-06-21 DIAGNOSIS — G40909 Epilepsy, unspecified, not intractable, without status epilepticus: Secondary | ICD-10-CM

## 2023-06-21 LAB — BASIC METABOLIC PANEL
Anion gap: 9 (ref 5–15)
BUN: 8 mg/dL (ref 6–20)
CO2: 22 mmol/L (ref 22–32)
Calcium: 8.4 mg/dL — ABNORMAL LOW (ref 8.9–10.3)
Chloride: 102 mmol/L (ref 98–111)
Creatinine, Ser: 1.3 mg/dL — ABNORMAL HIGH (ref 0.44–1.00)
GFR, Estimated: 49 mL/min — ABNORMAL LOW (ref >60)
Glucose, Bld: 84 mg/dL (ref 70–99)
Potassium: 4 mmol/L (ref 3.5–5.1)
Sodium: 133 mmol/L — ABNORMAL LOW (ref 135–145)

## 2023-06-21 LAB — CBC
HCT: 29.4 % — ABNORMAL LOW (ref 36.0–46.0)
Hemoglobin: 9.8 g/dL — ABNORMAL LOW (ref 12.0–15.0)
MCH: 28.1 pg (ref 26.0–34.0)
MCHC: 33.3 g/dL (ref 30.0–36.0)
MCV: 84.2 fL (ref 80.0–100.0)
Platelets: 608 10*3/uL — ABNORMAL HIGH (ref 150–400)
RBC: 3.49 MIL/uL — ABNORMAL LOW (ref 3.87–5.11)
RDW: 17 % — ABNORMAL HIGH (ref 11.5–15.5)
WBC: 7.2 10*3/uL (ref 4.0–10.5)
nRBC: 0 % (ref 0.0–0.2)

## 2023-06-21 LAB — CORTISOL: Cortisol, Plasma: 8.9 ug/dL

## 2023-06-21 LAB — MAGNESIUM: Magnesium: 1.9 mg/dL (ref 1.7–2.4)

## 2023-06-21 MED ORDER — SODIUM CHLORIDE 0.9 % IV BOLUS
1000.0000 mL | Freq: Once | INTRAVENOUS | Status: AC
  Administered 2023-06-21: 1000 mL via INTRAVENOUS

## 2023-06-21 MED ORDER — HYDROMORPHONE HCL 2 MG PO TABS
2.0000 mg | ORAL_TABLET | Freq: Four times a day (QID) | ORAL | Status: DC | PRN
  Administered 2023-06-21 – 2023-06-28 (×20): 2 mg via ORAL
  Filled 2023-06-21 (×20): qty 1

## 2023-06-21 MED ORDER — LOPERAMIDE HCL 2 MG PO CAPS
2.0000 mg | ORAL_CAPSULE | Freq: Four times a day (QID) | ORAL | Status: DC
  Administered 2023-06-21 – 2023-06-22 (×6): 2 mg via ORAL
  Filled 2023-06-21 (×6): qty 1

## 2023-06-21 MED ORDER — ONDANSETRON HCL 4 MG/2ML IJ SOLN
4.0000 mg | Freq: Four times a day (QID) | INTRAMUSCULAR | Status: DC | PRN
  Administered 2023-06-21 – 2023-06-27 (×7): 4 mg via INTRAVENOUS
  Filled 2023-06-21 (×7): qty 2

## 2023-06-21 MED ORDER — CYCLOBENZAPRINE HCL 5 MG PO TABS
10.0000 mg | ORAL_TABLET | Freq: Three times a day (TID) | ORAL | Status: DC | PRN
  Administered 2023-06-21 – 2023-06-28 (×15): 10 mg via ORAL
  Filled 2023-06-21 (×6): qty 2
  Filled 2023-06-21 (×3): qty 1
  Filled 2023-06-21 (×2): qty 2
  Filled 2023-06-21: qty 1
  Filled 2023-06-21: qty 2
  Filled 2023-06-21: qty 1
  Filled 2023-06-21: qty 2
  Filled 2023-06-21: qty 1

## 2023-06-21 MED ORDER — NAPHAZOLINE-PHENIRAMINE 0.025-0.3 % OP SOLN
1.0000 [drp] | Freq: Four times a day (QID) | OPHTHALMIC | Status: DC | PRN
  Administered 2023-06-21 – 2023-06-22 (×2): 1 [drp] via OPHTHALMIC
  Filled 2023-06-21: qty 15

## 2023-06-21 MED ORDER — SODIUM CHLORIDE 0.9 % IV BOLUS: 1000.0000 mL | Freq: Once | INTRAVENOUS | Status: DC

## 2023-06-21 MED ORDER — ONDANSETRON HCL 4 MG PO TABS: 4.0000 mg | ORAL_TABLET | Freq: Four times a day (QID) | ORAL | Status: DC | PRN

## 2023-06-21 NOTE — Plan of Care (Signed)

## 2023-06-21 NOTE — Progress Notes (Signed)
RUE venous duplex has been completed.   Results can be found under chart review under CV PROC. 06/21/2023 3:42 PM Faizan Geraci RVT, RDMS

## 2023-06-21 NOTE — Evaluation (Signed)
Physical Therapy Evaluation Patient Details Name: [name] MRN: [id number] DOB: [birth year] Today's Date: 06/21/2023  History of Present Illness  Pt is a 53 y/o F presenting to ED on 2/16 wtih colostomy bag leaking, lethargy, and hypotension, ?accidental overdose of narcotics. Admitted for septic shock related to cellulitis at ostomy site. PMH includes Chrohn's disease, COPD, collagen vascular disease, diabetes, seizure disorder, fungal endocarditis  Clinical Impression  Pt presents to PT with mild deficits in endurance, balance, gait, and power. Pt is able to ambulate for limited community distances with support of a RW. Pt will benefit from frequent mobilization in an effort to improve activity tolerance and to restore independence. Gait assessment with SPC would be beneficial next session as the pt utilizes a cane within the home. Pt has no PT or DME needs at the time of discharge.        If plan is discharge home, recommend the following: Assistance with cooking/housework;Assist for transportation   Can travel by private vehicle        Equipment Recommendations None recommended by PT  Recommendations for Other Services       Functional Status Assessment Patient has had a recent decline in their functional status and demonstrates the ability to make significant improvements in function in a reasonable and predictable amount of time.     Precautions / Restrictions Precautions Precautions: Fall Recall of Precautions/Restrictions: Intact Precaution/Restrictions Comments: ostomy Restrictions Weight Bearing Restrictions Per Provider Order: No      Mobility  Bed Mobility Overal bed mobility: Modified Independent Bed Mobility: Supine to Sit, Sit to Supine     Supine to sit: Modified independent (Device/Increase time) Sit to supine: Modified independent (Device/Increase time)        Transfers Overall transfer level: Needs assistance Equipment used: Rolling walker  (2 wheels) Transfers: Sit to/from Stand Sit to Stand: Supervision                Ambulation/Gait Ambulation/Gait assistance: Supervision Gait Distance (Feet): 1000 Feet Assistive device: Rolling walker (2 wheels) Gait Pattern/deviations: Step-through pattern Gait velocity: functional Gait velocity interpretation: >2.62 ft/sec, indicative of community ambulatory   General Gait Details: steady step-through gait  Stairs            Wheelchair Mobility     Tilt Bed    Modified Rankin (Stroke Patients Only)       Balance Overall balance assessment: Needs assistance Sitting-balance support: No upper extremity supported, Feet supported Sitting balance-Leahy Scale: Good     Standing balance support: Single extremity supported, Reliant on assistive device for balance Standing balance-Leahy Scale: Poor                               Pertinent Vitals/Pain Pain Assessment Pain Assessment: No/denies pain    Home Living Family/patient expects to be discharged to:: Private residence Living Arrangements: Non-relatives/Friends Available Help at Discharge: Friend(s);Family;Available PRN/intermittently Type of Home: House Home Access: Stairs to enter Entrance Stairs-Rails: Right Entrance Stairs-Number of Steps: 2   Home Layout: One level Home Equipment: Cane - single Librarian, academic (2 wheels);BSC/3in1;Shower seat Additional Comments: pt reports she will be returning to her own home    Prior Function Prior Level of Function : Independent/Modified Independent;Driving             Mobility Comments: uses cane indoors, RW out of home ADLs Comments: roommate assists with driving and med mgmt     Extremity/Trunk Assessment  Upper Extremity Assessment Upper Extremity Assessment: Defer to OT evaluation    Lower Extremity Assessment Lower Extremity Assessment: Overall WFL for tasks assessed    Cervical / Trunk Assessment Cervical / Trunk  Assessment: Normal  Communication   Communication Communication: No apparent difficulties    Cognition Arousal: Alert Behavior During Therapy: WFL for tasks assessed/performed   PT - Cognitive impairments: No apparent impairments                         Following commands: Intact       Cueing Cueing Techniques: Verbal cues     General Comments General comments (skin integrity, edema, etc.): VSS on RA, mild tachycardia into 120s briefly but otherwise stable    Exercises     Assessment/Plan    PT Assessment Patient needs continued PT services  PT Problem List Decreased strength;Decreased balance;Decreased activity tolerance;Cardiopulmonary status limiting activity       PT Treatment Interventions Gait training;Stair training;Functional mobility training;Balance training;Neuromuscular re-education;Patient/family education    PT Goals (Current goals can be found in the Care Plan section)  Acute Rehab PT Goals Patient Stated Goal: to return to golfing PT Goal Formulation: With patient Time For Goal Achievement: 07/05/23 Potential to Achieve Goals: Fair Additional Goals Additional Goal #1: Pt will score >19/24 on the DGI to indicate a reduced risk for falls    Frequency Min 1X/week     Co-evaluation               AM-PAC PT "6 Clicks" Mobility  Outcome Measure Help needed turning from your back to your side while in a flat bed without using bedrails?: None Help needed moving from lying on your back to sitting on the side of a flat bed without using bedrails?: None Help needed moving to and from a bed to a chair (including a wheelchair)?: A Little Help needed standing up from a chair using your arms (e.g., wheelchair or bedside chair)?: A Little Help needed to walk in hospital room?: A Little Help needed climbing 3-5 steps with a railing? : A Little 6 Click Score: 20    End of Session Equipment Utilized During Treatment: Gait belt Activity  Tolerance: Patient tolerated treatment well Patient left: in bed;with call bell/phone within reach;with bed alarm set Nurse Communication: Mobility status PT Visit Diagnosis: Other abnormalities of gait and mobility (R26.89)    Time: 1610-9604 PT Time Calculation (min) (ACUTE ONLY): 31 min   Charges:   PT Evaluation $PT Eval Low Complexity: 1 Low   PT General Charges $$ ACUTE PT VISIT: 1 Visit         Arlyss Gandy, PT, DPT Acute Rehabilitation Office [phone]   Arlyss Gandy 06/21/2023, 4:53 PM

## 2023-06-21 NOTE — TOC CM/SW Note (Signed)
Transition of Care [facility]) - Inpatient Brief Assessment   Patient Details  Name: [name] MRN: [id number] Date of Birth: April 21, 1971  Transition of Care [facility]) CM/SW Contact:    [name], [name], RN Phone Number: 06/21/2023, 2:08 PM   Clinical Narrative:  Patient presented to the ED with Ileostomy bag leakage.  Patient has hx of Opioid and Benzo use.   CM spoke with patient at bedside about discharge disposition and not having electricity at home. Patient states her friend [name] lives with her and that her daughter will be moving in with her at discharge. States she was living with a friend prior to admission d/t no electricity but will be going to her home at discharge. Patient states her daughter is working with Social Security to get her bills paid, states her electricity will be back on by discharge.   Has a cane, walker and shower seat at home.  PCP is [name], PA-C and uses [facility] in [place].  Patient states she has no preference for home health recommendations. CM called in referral to [facility] and [name] noted acceptance, info on AVS.  WOC working with patient to order correct Ileostomy supplies.   Patient not Medically ready for discharge.  CM will continue to follow as patient progresses with care towards discharge.            Transition of Care Asessment: Insurance and Status: Insurance coverage has been reviewed Patient has primary care physician: Yes Home environment has been reviewed: Yes Prior level of function:: Independent Prior/Current Home Services: No current home services Social Drivers of Health Review: SDOH reviewed no interventions necessary Readmission risk has been reviewed: Yes Transition of care needs: transition of care needs identified, TOC will continue to follow

## 2023-06-21 NOTE — Hospital Course (Addendum)
[name] is a 53 y.o. female with a history of hypertension, hyperlipidemia, diabetes mellitus type 2, COPD, seizure disorder, lumbar endocarditis, anxiety, C. difficile colitis status post total colectomy with end ileostomy..  Patient presented secondary to altered mental status and found to have hypotension requiring vasopressor support in ICU. Patient managed for hypovolemic shock with improvement of blood pressure.

## 2023-06-21 NOTE — Evaluation (Signed)
Occupational Therapy Evaluation Patient Details Name: [name] MRN: [id number] DOB: 09-25-1970 Today's Date: 06/21/2023   History of Present Illness   Pt is a 53 y/o F presenting to ED on 2/16 wtih colostomy bag leaking, lethargy, and hypotension, ?accidental overdose of narcotics. Admitted for septic shock related to cellulitis at ostomy site. PMH includes Chrohn's disease, COPD, collagen vascular disease, diabetes, seizure disorder, fungal endocarditis     Clinical Impressions Pt reports using SPC in home/RW in community for mobility, reports roommate assists with driving and med mgmt but is ind with her ADLs. Pt needing up to mod A for ADLs, CGA for bed mobility, and CGA for transfers/short distance ambulation in room. Pt able to complete seated/standing bathing grooming tasks at sink during session. Pt presenting with impairments listed below, will follow acutely. Recommend HHOT at d/c.     If plan is discharge home, recommend the following:   A little help with walking and/or transfers;A little help with bathing/dressing/bathroom;Assistance with cooking/housework;Direct supervision/assist for financial management;Direct supervision/assist for medications management;Assist for transportation;Help with stairs or ramp for entrance     Functional Status Assessment   Patient has had a recent decline in their functional status and demonstrates the ability to make significant improvements in function in a reasonable and predictable amount of time.     Equipment Recommendations   Tub/shower seat     Recommendations for Other Services   PT consult     Precautions/Restrictions   Precautions Precautions: Fall Restrictions Weight Bearing Restrictions Per Provider Order: No     Mobility Bed Mobility Overal bed mobility: Needs Assistance Bed Mobility: Supine to Sit     Supine to sit: Contact guard          Transfers Overall transfer level: Needs  assistance Equipment used: None Transfers: Sit to/from Stand Sit to Stand: Contact guard assist           General transfer comment: ambulated short distance in room from bed to sink for bathing      Balance Overall balance assessment: Needs assistance Sitting-balance support: Feet supported Sitting balance-Leahy Scale: Good     Standing balance support: Reliant on assistive device for balance, During functional activity Standing balance-Leahy Scale: Fair                             ADL either performed or assessed with clinical judgement   ADL Overall ADL's : Needs assistance/impaired Eating/Feeding: Set up   Grooming: Set up   Upper Body Bathing: Moderate assistance   Lower Body Bathing: Minimal assistance   Upper Body Dressing : Minimal assistance   Lower Body Dressing: Minimal assistance   Toilet Transfer: Contact guard assist;Ambulation   Toileting- Clothing Manipulation and Hygiene: Contact guard assist       Functional mobility during ADLs: Contact guard assist       Vision Baseline Vision/History: 1 Wears glasses Vision Assessment?: No apparent visual deficits;Wears glasses for reading     Perception Perception: Not tested       Praxis Praxis: Not tested       Pertinent Vitals/Pain Pain Assessment Pain Assessment: No/denies pain     Extremity/Trunk Assessment Upper Extremity Assessment Upper Extremity Assessment: Generalized weakness   Lower Extremity Assessment Lower Extremity Assessment: Defer to PT evaluation   Cervical / Trunk Assessment Cervical / Trunk Assessment: Normal   Communication Communication Communication: No apparent difficulties   Cognition Arousal: Alert Behavior During Therapy: [facility] [place]  for tasks assessed/performed Cognition: Cognition impaired             OT - Cognition Comments: some decr safety awareness                 Following commands: Intact       Cueing  General Comments           Exercises     Shoulder Instructions      Home Living Family/patient expects to be discharged to:: Private residence Living Arrangements: Non-relatives/Friends (roommate) Available Help at Discharge: Friend(s);Available 24 hours/day Type of Home: House Home Access: Stairs to enter [employer] of Steps: 2   Home Layout: One level     Bathroom Shower/Tub: Chief Strategy Officer: Standard     Home Equipment: Cane - single Librarian, academic (2 wheels);BSC/3in1   Additional Comments: per chart review, friend whom pt lives with is not allowing pt to return home      Prior Functioning/Environment Prior Level of Function : Independent/Modified Independent;Driving             Mobility Comments: uses cane indoors, RW out of home ADLs Comments: roommate assists with driving and med mgmt    OT Problem List: Decreased strength;Decreased range of motion;Decreased activity tolerance;Impaired balance (sitting and/or standing);Decreased safety awareness   OT Treatment/Interventions: Self-care/ADL training;Energy conservation;DME and/or AE instruction;Therapeutic exercise;Therapeutic activities;Visual/perceptual remediation/compensation;Patient/family education;Balance training      OT Goals(Current goals can be found in the care plan section)   Acute Rehab OT Goals Patient Stated Goal: none stated OT Goal Formulation: With patient Time For Goal Achievement: 07/05/23 Potential to Achieve Goals: Good ADL Goals Pt Will Perform Upper Body Dressing: with modified independence;sitting Pt Will Perform Lower Body Dressing: with modified independence;sit to/from stand;sitting/lateral leans Pt Will Transfer to Toilet: with modified independence;ambulating;regular height toilet Pt Will Perform Tub/Shower Transfer: with modified independence;ambulating Additional ADL Goal #1: pt will tolerate OOB standing functional task x10 min in order to improve activity  tolerance for ADLs   OT Frequency:  Min 1X/week    Co-evaluation              AM-PAC OT "6 Clicks" Daily Activity     Outcome Measure Help from another person eating meals?: None Help from another person taking care of personal grooming?: A Little Help from another person toileting, which includes using toliet, bedpan, or urinal?: A Lot Help from another person bathing (including washing, rinsing, drying)?: A Little Help from another person to put on and taking off regular upper body clothing?: A Little Help from another person to put on and taking off regular lower body clothing?: A Little 6 Click Score: 18   End of Session Nurse Communication: Mobility status  Activity Tolerance: Patient tolerated treatment well Patient left: with call bell/phone within reach;in chair;with chair alarm set  OT Visit Diagnosis: Unsteadiness on feet (R26.81);Other abnormalities of gait and mobility (R26.89);Muscle weakness (generalized) (M62.81)                Time: 1610-9604 OT Time Calculation (min): 34 min Charges:  OT General Charges $OT Visit: 1 Visit OT Evaluation $OT Eval Moderate Complexity: 1 Mod OT Treatments $Self Care/Home Management : 8-22 mins  Carver Fila, OTD, OTR/L SecureChat Preferred Acute Rehab [phone]   Carver Fila Koonce 06/21/2023, 12:19 PM

## 2023-06-21 NOTE — Progress Notes (Addendum)
PROGRESS NOTE    [name]  ZOX:[id number] DOB: May 21, 1970 DOA: 06/19/2023 PCP: [name], PA-C   Brief Narrative: [name] is a 53 y.o. female with a history of hypertension, hyperlipidemia, diabetes mellitus type 2, COPD, seizure disorder, lumbar endocarditis, anxiety, C. difficile colitis status post total colectomy with end ileostomy..  Patient presented secondary to altered mental status and found to have hypotension requiring vasopressor support in ICU. Patient managed for hypovolemic shock with improvement of blood pressure.   Assessment and Plan:  Hypovolemic shock Present on admission. Complicated by chronic hypotension. Sepsis rule out. Associated hypothermia. Patient was managed on Levophed on admission, which was weaned off on 2/17. Home midodrine restarted. Blood pressure remains borderline with some improvement.  AKI Baseline creatinine of about 0.9. Creatinine of 1.55 on admission, related to hypovolemic shock. Improvement with blood pressure control and IV fluids; slightly worsened today. -IV fluids today -Recheck BMP in AM  Abdominal pain Unclear if this is acute or chronic. This pain has at least been present for the past several days, per patient. Abdominal CT scan on admission significant for no etiology of pain. Patient is having good output from her ostomy. No emesis. -Analgesics  Acute toxic metabolic encephalopathy Presumed secondary to benzodiazepine and narcotics overdose. Patient returned to baseline. Psychiatry consulted with recommendation for no inpatient admission.  Hypomagnesemia Severe with magnesium down to 0.9. Resolved with supplementation.  History of total abdominal colectomy with ileostomy History of high output ostomy History of Crohn's disease Noted. Patient is managed on imodium as an outpatient. -Restart home imodium  Normocytic anemia Chronic. Baseline hemoglobin between 8-10. Hemoglobin of 10.2 on admission  with mild downward drift. Currently stable. Recent iron panel suggests no iron deficiency. Patient is on Eliquis as an outpatient.  Primary hypertension Patient is on metoprolol as an outpatient. Held on admission secondary to hypotension. -Continue to hold metoprolol  Hyperlipidemia Noted. Patient is not on lipid lowering medication as an outpatient.  History of RUE DVT Diagnosed in October 2024 secondary to PICC with DVT located in the right internal jugular vein and right brachial veins. Patient is on Eliquis (reduced dose) as an outpatient, which was held this admission. -Recheck right upper extremity venous duplex  Seizure disorder Patient is on Keppra as an outpatient. -Continue Keppra  Chronic pain Patient is on hydromorphone and gabapentin as an outpatient. -Resume home hydromorphone  Aortic atherosclerosis Seen incidentally on imaging. -Check lipid panel  Depression Anxiety -Psychiatry recommendations: discontinue Cymbalta, continue Xanax, continue amitriptyline   DVT prophylaxis: Subcutaneous heparin Code Status:   Code Status: Full Code Family Communication: None at bedside Disposition Plan: Discharge home pending stable blood pressure. PT/OT pending   Consultants:  PCCM  Procedures:  None  Antimicrobials: Vancomycin Ceftriaxone    Subjective: Patient reports abdominal pain. She states the pain has been stable for the last few days.  Objective: BP (!) 126/91 (BP Location: Left Wrist)   Pulse 99   Temp 97.6 F (36.4 C) (Axillary)   Resp 15   Ht 5\' 4"  (1.626 m)   Wt 55 kg   SpO2 98%   BMI 20.81 kg/m   Examination:  General exam: Appears calm and comfortable Respiratory system: Rhonchi. Respiratory effort normal. Cardiovascular system: S1 & S2 heard, RRR.  Gastrointestinal system: Abdomen is nondistended, soft and nontender. Decreased bowel sounds heard. Ostomy bag located RLQ and with brown stool Central nervous system: Alert and oriented.  No focal neurological deficits. Musculoskeletal: No edema. No calf  tenderness Skin: No cyanosis. No rashes Psychiatry: Judgement and insight appear normal. Mood & affect appropriate.    Data Reviewed: I have personally reviewed following labs and imaging studies  CBC Lab Results  Component Value Date   WBC 9.3 06/20/2023   RBC 3.02 (L) 06/20/2023   HGB 8.5 (L) 06/20/2023   HCT 25.4 (L) 06/20/2023   MCV 84.1 06/20/2023   MCH 28.1 06/20/2023   PLT 540 (H) 06/20/2023   MCHC 33.5 06/20/2023   RDW 16.9 (H) 06/20/2023   LYMPHSABS 3.5 05/10/2023   MONOABS 0.6 05/10/2023   EOSABS 0.3 05/10/2023   BASOSABS 0.1 05/10/2023     Last metabolic panel Lab Results  Component Value Date   NA 133 (L) 06/21/2023   K 4.0 06/21/2023   CL 102 06/21/2023   CO2 22 06/21/2023   BUN 8 06/21/2023   CREATININE 1.30 (H) 06/21/2023   GLUCOSE 84 06/21/2023   GFRNONAA 49 (L) 06/21/2023   GFRAA >60 07/30/2019   CALCIUM 8.4 (L) 06/21/2023   PHOS 3.9 06/20/2023   PROT 6.5 06/19/2023   ALBUMIN 3.3 (L) 06/19/2023   BILITOT 0.3 06/19/2023   ALKPHOS 95 06/19/2023   AST 24 06/19/2023   ALT 15 06/19/2023   ANIONGAP 9 06/21/2023    GFR: Estimated Creatinine Clearance: 42.7 mL/min (A) (by C-G formula based on SCr of 1.3 mg/dL (H)).  Recent Results (from the past 240 hours)  Blood Culture (routine x 2)     Status: None (Preliminary result)   Collection Time: 06/19/23  2:38 AM   Specimen: BLOOD  Result Value Ref Range Status   Specimen Description BLOOD BLOOD LEFT ARM  Final   Special Requests NONE  Final   Culture   Final    NO GROWTH 2 DAYS Performed at [facility], [address] [place], [place] [address]    Report Status PENDING  Incomplete  Blood Culture (routine x 2)     Status: None (Preliminary result)   Collection Time: 06/19/23  2:59 AM   Specimen: BLOOD RIGHT HAND  Result Value Ref Range Status   Specimen Description BLOOD RIGHT HAND  Final   Special Requests   Final    BOTTLES  DRAWN AEROBIC ONLY Blood Culture results may not be optimal due to an inadequate volume of blood received in culture bottles   Culture   Final    NO GROWTH 2 DAYS Performed at [facility], [address], [place], [place] [address]    Report Status PENDING  Incomplete  MRSA Next Gen by PCR, Nasal     Status: None   Collection Time: 06/19/23  9:42 AM   Specimen: Nasal Mucosa; Nasal Swab  Result Value Ref Range Status   MRSA by PCR Next Gen NOT DETECTED NOT DETECTED Final    Comment: (NOTE) The GeneXpert MRSA Assay (FDA approved for NASAL specimens only), is one component of a comprehensive MRSA colonization surveillance program. It is not intended to diagnose MRSA infection nor to guide or monitor treatment for MRSA infections. Test performance is not FDA approved in patients less than 56 years old. Performed at [facility], [address] [place], [place] [address]       Radiology Studies: [place] EKG SITE RITE Result Date: 06/20/2023 If [facility] image not attached, placement could not be confirmed due to current cardiac rhythm.     LOS: 2 days    Jacquelin Hawking, MD [facility] 06/21/2023, 8:42 AM   If 7PM-7AM,  please contact night-coverage [email or web address]
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
## 2023-06-22 DIAGNOSIS — R6521 Severe sepsis with septic shock: Secondary | ICD-10-CM | POA: Diagnosis not present

## 2023-06-22 DIAGNOSIS — A419 Sepsis, unspecified organism: Secondary | ICD-10-CM | POA: Diagnosis not present

## 2023-06-22 LAB — FOLATE: Folate: 8.2 ng/mL (ref >5.9)

## 2023-06-22 LAB — CBC
HCT: 27.2 % — ABNORMAL LOW (ref 36.0–46.0)
Hemoglobin: 8.9 g/dL — ABNORMAL LOW (ref 12.0–15.0)
MCH: 27.9 pg (ref 26.0–34.0)
MCHC: 32.7 g/dL (ref 30.0–36.0)
MCV: 85.3 fL (ref 80.0–100.0)
Platelets: 539 10*3/uL — ABNORMAL HIGH (ref 150–400)
RBC: 3.19 MIL/uL — ABNORMAL LOW (ref 3.87–5.11)
RDW: 16.8 % — ABNORMAL HIGH (ref 11.5–15.5)
WBC: 7.7 10*3/uL (ref 4.0–10.5)
nRBC: 0 % (ref 0.0–0.2)

## 2023-06-22 LAB — URINALYSIS, ROUTINE W REFLEX MICROSCOPIC
Bilirubin Urine: NEGATIVE
Glucose, UA: NEGATIVE mg/dL
Hgb urine dipstick: NEGATIVE
Ketones, ur: NEGATIVE mg/dL
Leukocytes,Ua: NEGATIVE
Nitrite: NEGATIVE
Protein, ur: NEGATIVE mg/dL
Specific Gravity, Urine: 1.005 (ref 1.005–1.030)
pH: 5 (ref 5.0–8.0)

## 2023-06-22 LAB — VITAMIN B12: Vitamin B-12: 196 pg/mL (ref 180–914)

## 2023-06-22 LAB — BASIC METABOLIC PANEL
Anion gap: 11 (ref 5–15)
BUN: 7 mg/dL (ref 6–20)
CO2: 19 mmol/L — ABNORMAL LOW (ref 22–32)
Calcium: 8.8 mg/dL — ABNORMAL LOW (ref 8.9–10.3)
Chloride: 107 mmol/L (ref 98–111)
Creatinine, Ser: 1.53 mg/dL — ABNORMAL HIGH (ref 0.44–1.00)
GFR, Estimated: 40 mL/min — ABNORMAL LOW (ref >60)
Glucose, Bld: 93 mg/dL (ref 70–99)
Potassium: 4.1 mmol/L (ref 3.5–5.1)
Sodium: 137 mmol/L (ref 135–145)

## 2023-06-22 LAB — RETICULOCYTES
Immature Retic Fract: 12.8 % (ref 2.3–15.9)
RBC.: 3.58 MIL/uL — ABNORMAL LOW (ref 3.87–5.11)
Retic Count, Absolute: 57.3 10*3/uL (ref 19.0–186.0)
Retic Ct Pct: 1.6 % (ref 0.4–3.1)

## 2023-06-22 LAB — LIPID PANEL
Cholesterol: 145 mg/dL (ref 0–200)
HDL: 53 mg/dL (ref >40)
LDL Cholesterol: 31 mg/dL (ref 0–99)
Total CHOL/HDL Ratio: 2.7 {ratio}
Triglycerides: 306 mg/dL — ABNORMAL HIGH (ref <150)
VLDL: 61 mg/dL — ABNORMAL HIGH (ref 0–40)

## 2023-06-22 LAB — IRON AND TIBC
Iron: 83 ug/dL (ref 28–170)
Saturation Ratios: 23 % (ref 10.4–31.8)
TIBC: 358 ug/dL (ref 250–450)
UIBC: 275 ug/dL

## 2023-06-22 LAB — FERRITIN: Ferritin: 94 ng/mL (ref 11–307)

## 2023-06-22 MED ORDER — SODIUM CHLORIDE 0.9 % IV BOLUS
500.0000 mL | Freq: Once | INTRAVENOUS | Status: AC
  Administered 2023-06-22: 500 mL via INTRAVENOUS

## 2023-06-22 MED ORDER — APIXABAN 5 MG PO TABS
5.0000 mg | ORAL_TABLET | Freq: Two times a day (BID) | ORAL | Status: DC
Start: 2023-06-22 — End: 2023-06-23
  Administered 2023-06-22 – 2023-06-23 (×3): 5 mg via ORAL
  Filled 2023-06-22 (×3): qty 1

## 2023-06-22 MED ORDER — SODIUM CHLORIDE 0.9 % IV SOLN: INTRAVENOUS | Status: DC

## 2023-06-22 MED ORDER — METHYLPREDNISOLONE SODIUM SUCC 40 MG IJ SOLR
40.0000 mg | Freq: Once | INTRAMUSCULAR | Status: AC
Start: 2023-06-22 — End: 2023-06-22
  Administered 2023-06-22: 40 mg via INTRAVENOUS
  Filled 2023-06-22: qty 1

## 2023-06-22 MED ORDER — FAMOTIDINE IN NACL 20-0.9 MG/50ML-% IV SOLN
20.0000 mg | Freq: Two times a day (BID) | INTRAVENOUS | Status: DC
  Administered 2023-06-22 – 2023-06-23 (×2): 20 mg via INTRAVENOUS
  Filled 2023-06-22 (×2): qty 50

## 2023-06-22 MED ORDER — LOPERAMIDE HCL 2 MG PO CAPS
2.0000 mg | ORAL_CAPSULE | ORAL | Status: DC
  Administered 2023-06-22 – 2023-06-27 (×17): 2 mg via ORAL
  Filled 2023-06-22 (×19): qty 1

## 2023-06-22 NOTE — Plan of Care (Signed)

## 2023-06-22 NOTE — Progress Notes (Addendum)
PHARMACY - ANTICOAGULATION CONSULT NOTE  Pharmacy Consult:  Eliquis Indication: Chronic DVT  Allergies  Allergen Reactions   Codeine Shortness Of Breath, Swelling and Rash    Throat swelling   Hydrocodone Shortness Of Breath, Swelling and Rash   Ketorolac Nausea And Vomiting, Swelling and Nausea Only    Pt reports n/v and swelling to throat   Morphine And Codeine Shortness Of Breath, Swelling and Rash   Penicillins Shortness Of Breath, Swelling and Other (See Comments)    Throat swells 02/06/21--TOLERATES CEFTRIAXONE     Nickel Rash   Pregabalin Other (See Comments)   Acetaminophen     Per MD patient states she can't take Tylenol because of liver enzymes   Atarax [Hydroxyzine] Itching and Swelling    Mildly swollen throat   Ativan [Lorazepam] Other (See Comments)    Migraines.   Oxycodone-Acetaminophen Nausea Only and Nausea And Vomiting   Strawberry Extract Swelling   Watermelon Concentrate Swelling   Albuterol Palpitations   Benadryl [Diphenhydramine Hcl] Palpitations   Humira [Adalimumab] Rash   Latex Rash   Remicade [Infliximab] Rash    Blisters and Welts    Tramadol Rash    Rash and itching    Patient Measurements: Height: 5\' 4"  (162.6 cm) Weight: 55 kg (121 lb 4.1 oz) IBW/kg (Calculated) : 54.7  Vital Signs: Temp: 97.9 F (36.6 C) (02/19 0706) Temp Source: Oral (02/19 0706) BP: 84/55 (02/19 0731) Pulse Rate: 88 (02/19 0731)  Labs: Recent Labs    06/20/23 0248 06/21/23 0307 06/21/23 0804 06/22/23 0334  HGB 8.5*  --  9.8* 8.9*  HCT 25.4*  --  29.4* 27.2*  PLT 540*  --  608* 539*  CREATININE 1.10* 1.30*  --  1.53*    Estimated Creatinine Clearance: 36.3 mL/min (A) (by C-G formula based on SCr of 1.53 mg/dL (H)).   Medical History: Past Medical History:  Diagnosis Date   Anxiety    Asthma    Chronic low back pain    Collagen vascular disease (HCC)    COPD (chronic obstructive pulmonary disease) (HCC)    Crohn's disease (HCC)    Fungal  endocarditis 03/28/2023   GERD (gastroesophageal reflux disease)    EGD 01/2007 by Dr.Rourke small hiatal hernia s/p 56 french maloney    History of head injury    Hypertension    IBS (irritable bowel syndrome)    PSVT (paroxysmal supraventricular tachycardia) (HCC)    PTSD (post-traumatic stress disorder)    Recurrent chest pain    Seizure disorder (HCC)    Type 2 diabetes mellitus (HCC)     Assessment: [name] with history of PICC-associated RUE DVT on Eliquis PTA.  Questionable compliance.  Doppler this admission shows chronic DVT.  Pharmacy consulted to resume Eliquis.  CBC stable; no bleeding reported.  Goal of Therapy:  Appropriate anticoagulation Monitor platelets by anticoagulation protocol: Yes   Plan:  D/C HSQ Resume Eliquis 5mg  PO BID Pharmacy will sign off and follow peripherally  [name] D. Laney Potash, PharmD, BCPS, BCCCP 06/22/2023, 7:39 AM

## 2023-06-22 NOTE — Progress Notes (Addendum)
PROGRESS NOTE    [name]  ZOX:[id number] DOB: 03-14-1971 DOA: 06/19/2023 PCP: [name], PA-C   Brief Narrative: 53 year old past medical history significant for hypertension, hyperlipidemia, diabetes type 2, COPD, seizure disorder, lumbar endocarditis, anxiety, C. difficile colitis status post total colectomy with end ileostomy.  Patient presented secondary to altered mental status and found to have hypotension requiring vasopressor support in the ICU.  Managed for hypovolemic shock with improvement of blood pressure.   Assessment & Plan:   Principal Problem:   Sepsis (HCC) Active Problems:   Benzodiazepine (tranquilizer) overdose   1-Hypovolemic shock: -Patient presented with worsening hypotension on chronic hypotension.  Sepsis was ruled out Associated with hypothermia.  Patient was managed with Levophed on admission weaned off to 8/17. -Home midodrine restarted. -Continue with IV fluids.  -Continue with midodrine  2-AKI: Creatinine peak on admission 1.5 Related to hypovolemic shock Improved with IV fluids initially.  Cr increase to 1.5 today. Resume IV fluids.     Abdominal pain Acute on chronic CT abdomen and pelvis: Changes consistent with prior colectomy and right side ileostomy.  No acute abnormality noted.  Acute toxic metabolic encephalopathy: Diazepam overdose -Presumed secondary to benzodiazepine and narcotics overdose. -Back to baseline Psych  consulted and recommendation for no inpatient admission  Right upper extremity DVT: Diagnosed October 2024 secondary to PICC with DVT located in the right internal jugular vein and right brachial veins. Eliquis held on admission. Recheck right upper extremity venous duplex: Resume Eliquis  Seizure disorder: -Continue with Keppra  Chronic pain: -Continue with Tylenol, Flexeril, gabapentin oral Dilaudid  Aortic atherosclerosis:  Depression anxiety: Evaluated by psych, no need for inpatient  admission  History of total abdominal colectomy with ileostomy History of high output ostomy History of Crohn's disease -Continue with IV fluids, Imodium  Normocytic  anemia: -Monitor Hb  Primary hypertension: Holding metoprolol due to hypotension  Addendum; Patient received atarax today, nurse notice facial and neck flushness, Atarax is on allergy med list. She received meds yesterday as well. Will monitor, will give a dose of IV pepcid and IV solumedrol.   Estimated body mass index is 20.81 kg/m as calculated from the following:   Height as of this encounter: 5\' 4"  (1.626 m).   Weight as of this encounter: 55 kg.   DVT prophylaxis: Eliquis Code Status: Full code Family Communication: Disposition Plan:  Status is: Inpatient Remains inpatient appropriate because: management of AKI, Hypotension.     Consultants:  Psych  Procedures:  none  Antimicrobials:    Subjective: Still having abdominal pain, also thinks she has a UTI report pulling out when she is going to urinate  Objective: Vitals:   06/22/23 0530 06/22/23 0600 06/22/23 0630 06/22/23 0706  BP: 90/61 105/60 108/67   Pulse: 97 95 93   Resp: (!) 24 17 15    Temp:    97.9 F (36.6 C)  TempSrc:    Oral  SpO2: 99% 99% 99%   Weight:      Height:        Intake/Output Summary (Last 24 hours) at 06/22/2023 0715 Last data filed at 06/22/2023 0505 Gross per 24 hour  Intake 1506.69 ml  Output 2750 ml  Net -1243.31 ml   Filed Weights   06/19/23 0205  Weight: 55 kg    Examination:  General exam: Appears calm and comfortable  Respiratory system: Clear to auscultation. Respiratory effort normal. Cardiovascular system: S1 & S2 heard, RRR.  Gastrointestinal system: Abdomen is nondistended, soft and nontender. No  organomegaly or masses felt. Normal bowel sounds heard. Central nervous system: Alert and oriented.  Extremities: Symmetric 5 x 5 power.   Data Reviewed: I have personally reviewed following labs  and imaging studies  CBC: Recent Labs  Lab 06/19/23 0238 06/19/23 1201 06/20/23 0248 06/21/23 0804 06/22/23 0334  WBC 12.4* 10.9* 9.3 7.2 7.7  HGB 10.2* 10.9* 8.5* 9.8* 8.9*  HCT 30.4* 33.1* 25.4* 29.4* 27.2*  MCV 84.7 84.4 84.1 84.2 85.3  PLT 586* 666* 540* 608* 539*   Basic Metabolic Panel: Recent Labs  Lab 06/19/23 0238 06/20/23 0248 06/21/23 0307 06/22/23 0334  NA 132* 135 133* 137  K 3.9 3.5 4.0 4.1  CL 99 106 102 107  CO2 22 19* 22 19*  GLUCOSE 89 82 84 93  BUN 14 6 8 7   CREATININE 1.55* 1.10* 1.30* 1.53*  CALCIUM 8.8* 8.0* 8.4* 8.8*  MG  --  0.9* 1.9  --   PHOS  --  3.9  --   --    GFR: Estimated Creatinine Clearance: 36.3 mL/min (A) (by C-G formula based on SCr of 1.53 mg/dL (H)). Liver Function Tests: Recent Labs  Lab 06/19/23 0238  AST 24  ALT 15  ALKPHOS 95  BILITOT 0.3  PROT 6.5  ALBUMIN 3.3*   Recent Labs  Lab 06/19/23 0238  LIPASE 32   No results for input(s): "AMMONIA" in the last 168 hours. Coagulation Profile: Recent Labs  Lab 06/19/23 0238  INR 1.0   Cardiac Enzymes: No results for input(s): "CKTOTAL", "CKMB", "CKMBINDEX", "TROPONINI" in the last 168 hours. BNP (last 3 results) No results for input(s): "PROBNP" in the last 8760 hours. HbA1C: No results for input(s): "HGBA1C" in the last 72 hours. CBG: Recent Labs  Lab 06/19/23 0652 06/19/23 0839 06/19/23 0925 06/19/23 1156 06/19/23 1530  GLUCAP 70 93 94 90 95   Lipid Profile: Recent Labs    06/22/23 0334  CHOL 145  HDL 53  LDLCALC 31  TRIG 306*  CHOLHDL 2.7   Thyroid Function Tests: No results for input(s): "TSH", "T4TOTAL", "FREET4", "T3FREE", "THYROIDAB" in the last 72 hours. Anemia Panel: No results for input(s): "VITAMINB12", "FOLATE", "FERRITIN", "TIBC", "IRON", "RETICCTPCT" in the last 72 hours. Sepsis Labs: Recent Labs  Lab 06/19/23 0238 06/19/23 0418 06/19/23 1201  PROCALCITON  --   --  <0.10  LATICACIDVEN 1.5 2.5* 1.1    Recent Results (from  the past 240 hours)  Blood Culture (routine x 2)     Status: None (Preliminary result)   Collection Time: 06/19/23  2:38 AM   Specimen: BLOOD  Result Value Ref Range Status   Specimen Description BLOOD BLOOD LEFT ARM  Final   Special Requests NONE  Final   Culture   Final    NO GROWTH 3 DAYS Performed at [facility], [address] [place], [place] [address]    Report Status PENDING  Incomplete  Blood Culture (routine x 2)     Status: None (Preliminary result)   Collection Time: 06/19/23  2:59 AM   Specimen: BLOOD RIGHT HAND  Result Value Ref Range Status   Specimen Description BLOOD RIGHT HAND  Final   Special Requests   Final    BOTTLES DRAWN AEROBIC ONLY Blood Culture results may not be optimal due to an inadequate volume of blood received in culture bottles   Culture   Final    NO GROWTH 3 DAYS Performed at [facility], [address] [place], [place] [address]    Report  Status PENDING  Incomplete  MRSA Next Gen by PCR, Nasal     Status: None   Collection Time: 06/19/23  9:42 AM   Specimen: Nasal Mucosa; Nasal Swab  Result Value Ref Range Status   MRSA by PCR Next Gen NOT DETECTED NOT DETECTED Final    Comment: (NOTE) The GeneXpert MRSA Assay (FDA approved for NASAL specimens only), is one component of a comprehensive MRSA colonization surveillance program. It is not intended to diagnose MRSA infection nor to guide or monitor treatment for MRSA infections. Test performance is not FDA approved in patients less than 80 years old. Performed at [facility], [address], [place], [place] [address]          Radiology Studies: VAS [place] UPPER EXTREMITY VENOUS DUPLEX Result Date: 06/21/2023 UPPER VENOUS STUDY  Patient Name:  LITITIA SEN [facility]  Date of Exam:   06/21/2023 Medical Rec #: [id number]           Accession #:    [id number] Date of Birth: [birth year]           Patient Gender: F Patient Age:   59 years Exam Location:  [facility] Procedure:      VAS [place]  UPPER EXTREMITY VENOUS DUPLEX Referring Phys: RALPH NETTEY --------------------------------------------------------------------------------  Indications: History of DVT Anticoagulation: Eliquis prior to admission. Comparison Study: Previous exam on 02/19/2023 positive for DVT in RUE (IJV &                   brachial vein) Performing Technologist: Ernestene Mention RVT, RDMS  Examination Guidelines: A complete evaluation includes B-mode imaging, spectral Doppler, color Doppler, and power Doppler as needed of all accessible portions of each vessel. Bilateral testing is considered an integral part of a complete examination. Limited examinations for reoccurring indications may be performed as noted.  Right Findings: +----------+------------+---------+-----------+----------+-------------------+ RIGHT     CompressiblePhasicitySpontaneousProperties      Summary       +----------+------------+---------+-----------+----------+-------------------+ IJV         Partial      No        No                     Chronic       +----------+------------+---------+-----------+----------+-------------------+ Subclavian    Full       Yes       Yes                                  +----------+------------+---------+-----------+----------+-------------------+ Axillary      Full       Yes       Yes                                  +----------+------------+---------+-----------+----------+-------------------+ Brachial      Full       Yes       Yes                                  +----------+------------+---------+-----------+----------+-------------------+ Radial        Full                                                      +----------+------------+---------+-----------+----------+-------------------+  Ulnar         Full                                  Not well visualized +----------+------------+---------+-----------+----------+-------------------+ Cephalic                                               Not visualized    +----------+------------+---------+-----------+----------+-------------------+ Basilic       Full       Yes       Yes                                  +----------+------------+---------+-----------+----------+-------------------+ Proximal IJV os chronically occluded, however mid and distal portions are patent with phasic flow.  Left Findings: +----------+------------+---------+-----------+----------+-------+ LEFT      CompressiblePhasicitySpontaneousPropertiesSummary +----------+------------+---------+-----------+----------+-------+ Subclavian    Full       Yes       Yes                      +----------+------------+---------+-----------+----------+-------+  Summary:  Right: No evidence of superficial vein thrombosis in the upper extremity. 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  Left: No evidence of thrombosis in the subclavian.  *See table(s) above for measurements and observations.  Diagnosing physician: Lemar Livings MD Electronically signed by Lemar Livings MD on 06/21/2023 at 4:35:38 PM.    Final    [place] EKG SITE RITE Result Date: 06/20/2023 If Site Rite image not attached, placement could not be confirmed due to current cardiac rhythm.       Scheduled Meds:  amitriptyline  100 mg Oral QHS   Chlorhexidine Gluconate Cloth  6 each Topical Daily   DULoxetine  30 mg Oral Daily   famotidine  20 mg Oral BID   folic acid  1 mg Oral Daily   gabapentin  300 mg Oral BID   Gerhardt's butt cream   Topical BID   heparin injection (subcutaneous)  5,000 Units Subcutaneous Q8H   levETIRAcetam  750 mg Oral BID   loperamide  2 mg Oral Q6H   midodrine  5 mg Oral Q8H   thiamine  100 mg Oral Daily   Continuous Infusions:   LOS: 3 days    Time spent: 35 minutes    Adeline Petitfrere A Alfredo Collymore, MD [facility]   If 7PM-7AM, please contact night-coverage [email or web address]  06/22/2023, 7:15 AM

## 2023-06-22 NOTE — TOC Initial Note (Signed)
Transition of Care [facility]) - Initial/Assessment Note    Patient Details  Name: [name] MRN: [id number] Date of Birth: Jan 14, 1971  Transition of Care [facility]) CM/SW Contact:    Marliss Coots, LCSW Phone Number: 06/22/2023, 8:40 AM  Clinical Narrative:                  8:40 AM CSW spoke with patient regarding TOC Consult (substance use counseling/education) and offered resources. Patient declined CSW offer. Per chart review, physical therapy did not have follow up recommendations for patient upon discharge. CSW closed TOC consult.  Expected Discharge Plan: Home/Self Care Barriers to Discharge: Continued Medical Work up   Patient Goals and CMS Choice Patient states their goals for this hospitalization and ongoing recovery are:: to go home          Expected Discharge Plan and Services In-house Referral: Clinical Social Work     Living arrangements for the past 2 months: Single Family Home                                      Prior Living Arrangements/Services Living arrangements for the past 2 months: Single Family Home Lives with:: Friends Patient language and need for interpreter reviewed:: Yes        Need for Family Participation in Patient Care: No (Comment) Care giver support system in place?: No (comment)   Criminal Activity/Legal Involvement Pertinent to Current Situation/Hospitalization: No - Comment as needed  Activities of Daily Living   ADL Screening (condition at time of admission) Independently performs ADLs?: No Does the patient have a NEW difficulty with bathing/dressing/toileting/self-feeding that is expected to last >3 days?: Yes (Initiates electronic notice to provider for possible OT consult) Does the patient have a NEW difficulty with getting in/out of bed, walking, or climbing stairs that is expected to last >3 days?: Yes (Initiates electronic notice to provider for possible PT consult) Does the patient have a NEW difficulty with  communication that is expected to last >3 days?: No Is the patient deaf or have difficulty hearing?: No Does the patient have difficulty seeing, even when wearing glasses/contacts?: No Does the patient have difficulty concentrating, remembering, or making decisions?: No  Permission Sought/Granted Permission sought to share information with : Family Supports Permission granted to share information with : No (Contact information on chart)  Share Information with NAME: Janan Ridge     Permission granted to share info w Relationship: Daughter  Permission granted to share info w Contact Information: [phone]  Emotional Assessment     Affect (typically observed): Accepting, Appropriate, Stable, Calm Orientation: : Oriented to Self, Oriented to Place, Oriented to  Time, Oriented to Situation Alcohol / Substance Use: Other (comment) (hx of opioid and benzo use) Psych Involvement: No (comment)  Admission diagnosis:  Septic shock (HCC) [A41.9, R65.21] Sepsis (HCC) [A41.9] Patient Active Problem List   Diagnosis Date Noted   Benzodiazepine (tranquilizer) overdose 06/21/2023   Acute renal failure (ARF) (HCC) 05/10/2023   Acute kidney injury (HCC) 04/22/2023   SIRS (systemic inflammatory response syndrome) (HCC) 04/22/2023   Hypomagnesemia 04/07/2023   Acute post-hemorrhagic anemia 04/07/2023   Septic shock (HCC) 04/03/2023   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   Cardiac arrest (HCC) 01/27/2023    Acute respiratory failure with hypoxia (HCC) 01/27/2023   Upper GI bleed 01/27/2023   GI bleed 01/15/2022   Rectal bleeding 01/12/2022   Acute deep vein thrombosis (DVT) of brachial vein of right upper extremity (HCC)    Enteritis due to Clostridium difficile 12/24/2021   Proctocolitis 12/23/2021   UTI (urinary tract infection) 12/23/2021   Thrombocytosis 10/02/2021   Gait instability 10/02/2021   Hypokalemia 10/01/2021   Skin test reaction, systemic 09/07/2021   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   N&V (nausea and vomiting) 04/30/2021   Abdominal pain    Bloating    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HCC)    Hematochezia 02/04/2021   Crohn's disease of large intestine with other complication (HCC) 02/03/2021   Diarrhea    Metabolic encephalopathy 01/20/2021   Moderate Pericardial effusion 01/19/2021   Right foot pain    AKI (acute kidney injury) (HCC) 01/16/2021   Rhabdomyolysis 01/16/2021   Lactic acidosis 01/16/2021   COVID-19 virus infection 01/16/2021   Shock (HCC) 01/16/2021   Seizure (HCC) 06/24/2020   MDD (major depressive disorder) 03/24/2020   Chest pain 02/07/2020   Chronic patellofemoral pain of both knees 01/11/2020   Essential hypertension 06/17/2018   Insomnia 12/13/2017   Neuropathy 12/13/2017   Chest pain at rest 05/09/2016   COPD (chronic obstructive pulmonary disease) (HCC)    Crohn's disease with complication (HCC)    Diabetes mellitus without complication (HCC)    Anxiety    Cigarette smoker    PTSD (post-traumatic stress disorder)    Palpitation    Constipation 09/20/2012   Head injury 05/09/2012   Hyperlipidemia 04/11/2012   DJD (degenerative joint disease), lumbar 04/04/2012   Paroxysmal SVT (supraventricular tachycardia) (HCC) 04/04/2012   Tobacco dependence 04/04/2012   DOE (dyspnea on exertion) 01/26/2012   Paresthesia 11/25/2011   Abdominal pain, chronic, epigastric 11/01/2011   Abdominal pain, acute, periumbilical 11/01/2011    GERD (gastroesophageal reflux disease)    Hyperglycemia    Chronic low back pain    IBS (irritable bowel syndrome)    Closed head injury    TOBACCO ABUSE 07/13/2010   Palpitations 07/13/2010   Pleuritic chest pain 07/13/2010   Anxiety state 07/03/2008   BACK PAIN, LUMBAR, CHRONIC 07/03/2008   PCP:  [name], PA-C Pharmacy:   [facility] [place], [place] - [address] [place] [address] Phone: [phone] Fax: [phone]  [facility] [address], [place] - [address] [facility] [address] [place] [address] Phone: [phone] Fax: [phone]  [facility] [address] [place] - [address] [place] [address] [place] [address] [place] [address] Phone: [phone] Fax: [phone]  [name] [address] [place] [address] Phone: [phone] Fax: [phone]  [facility] [address] [place] [address] Phone: [phone] Fax: [phone]     Social Drivers of Health (SDOH) Social History: SDOH Screenings   Food Insecurity: Food Insecurity Present (06/20/2023)  Housing: High Risk (06/20/2023)  Transportation Needs: Unmet Transportation Needs (06/20/2023)  Utilities: At Risk (06/20/2023)  Financial Resource Strain: Medium Risk (06/09/2023)   [facility] from [facility]  Health Care  Physical Activity: Inactive (06/09/2023)   Received from [facility]  Social Connections: Socially Isolated (06/09/2023)   Received from [facility]  Stress: Stress Concern Present (06/09/2023)   Received from [facility]  Tobacco Use: High Risk (06/19/2023)  Health Literacy: Medium Risk (06/09/2023)   Received from [facility]   SDOH Interventions: Transportation Interventions: Inpatient TOC   Readmission Risk Interventions    06/21/2023    2:07 PM 05/12/2023   11:42 AM 04/25/2023    9:13 AM  Readmission Risk Prevention Plan  Transportation  Screening Complete Complete Complete  Medication Review (RN Care Manager) Referral to Pharmacy  Complete  PCP or Specialist appointment within 3-5 days of discharge Complete Complete Complete  HRI or Home Care Consult Complete Complete Complete  SW Recovery Care/Counseling Consult Complete Complete Complete  Palliative Care Screening Not Applicable Not Applicable Not Applicable  Skilled Nursing Facility  Not Applicable Not Applicable

## 2023-06-22 NOTE — Progress Notes (Signed)
Pt received dose of atarax 25mg  at 1632 and is now presenting with flushed neck and face. No BPA fired at the time of administration. After reviewing pt's EMR it is noted that pt has allergy to atarax. Pt states "this is how it feels when my blood pressure is high or maybe it's anxiety". Vital signs stable, no SOB noted, no other symptoms. Lonia Farber, MD notified. Spoke with MD via phone and orders for pepcid IVPB 20mg  and IV solu-medrol 40mg  one time dose.

## 2023-06-23 ENCOUNTER — Inpatient Hospital Stay (HOSPITAL_COMMUNITY): Payer: Medicaid Other

## 2023-06-23 DIAGNOSIS — A419 Sepsis, unspecified organism: Secondary | ICD-10-CM | POA: Diagnosis not present

## 2023-06-23 DIAGNOSIS — K9409 Other complications of colostomy: Secondary | ICD-10-CM

## 2023-06-23 DIAGNOSIS — R1310 Dysphagia, unspecified: Secondary | ICD-10-CM | POA: Diagnosis not present

## 2023-06-23 DIAGNOSIS — Z8719 Personal history of other diseases of the digestive system: Secondary | ICD-10-CM

## 2023-06-23 DIAGNOSIS — Z8619 Personal history of other infectious and parasitic diseases: Secondary | ICD-10-CM | POA: Diagnosis not present

## 2023-06-23 DIAGNOSIS — Z9889 Other specified postprocedural states: Secondary | ICD-10-CM

## 2023-06-23 DIAGNOSIS — R6521 Severe sepsis with septic shock: Secondary | ICD-10-CM | POA: Diagnosis not present

## 2023-06-23 DIAGNOSIS — R109 Unspecified abdominal pain: Secondary | ICD-10-CM

## 2023-06-23 LAB — BASIC METABOLIC PANEL
Anion gap: 8 (ref 5–15)
BUN: 5 mg/dL — ABNORMAL LOW (ref 6–20)
CO2: 24 mmol/L (ref 22–32)
Calcium: 9.2 mg/dL (ref 8.9–10.3)
Chloride: 104 mmol/L (ref 98–111)
Creatinine, Ser: 0.99 mg/dL (ref 0.44–1.00)
GFR, Estimated: >60 mL/min (ref >60)
Glucose, Bld: 111 mg/dL — ABNORMAL HIGH (ref 70–99)
Potassium: 5 mmol/L (ref 3.5–5.1)
Sodium: 136 mmol/L (ref 135–145)

## 2023-06-23 MED ORDER — METOPROLOL TARTRATE 12.5 MG HALF TABLET
12.5000 mg | ORAL_TABLET | Freq: Two times a day (BID) | ORAL | Status: DC
  Administered 2023-06-23 – 2023-06-27 (×6): 12.5 mg via ORAL
  Filled 2023-06-23 (×11): qty 1

## 2023-06-23 MED ORDER — LACTATED RINGERS IV SOLN: INTRAVENOUS | Status: DC

## 2023-06-23 MED ORDER — FAMOTIDINE 20 MG PO TABS
20.0000 mg | ORAL_TABLET | Freq: Two times a day (BID) | ORAL | Status: DC
  Administered 2023-06-23 – 2023-06-27 (×9): 20 mg via ORAL
  Filled 2023-06-23 (×10): qty 1

## 2023-06-23 MED ORDER — HEPARIN (PORCINE) 25000 UT/250ML-% IV SOLN
1000.0000 [IU]/h | INTRAVENOUS | Status: AC
  Administered 2023-06-23: 900 [IU]/h via INTRAVENOUS
  Administered 2023-06-24: 1000 [IU]/h via INTRAVENOUS
  Filled 2023-06-23 (×2): qty 250

## 2023-06-23 MED ORDER — MESALAMINE 1000 MG RE SUPP: 1000.0000 mg | Freq: Every day | RECTAL | Status: DC

## 2023-06-23 NOTE — TOC Progression Note (Signed)
Transition of Care [facility]) - Progression Note    Patient Details  Name: [name] MRN: [id number] Date of Birth: 1970-10-14  Transition of Care [facility]) CM/SW Contact  [name], [name], RN Phone Number: 06/23/2023, 12:58 PM  Clinical Narrative:     GI consulted for worsening Dysphagia. Plan for Barium Swallow study today, EGD to r/o Reflux Esophagitis, Candidiasis Esophagitis and Esophageal Stricture on Saturday 06/25/23.   Patient not Medically ready for discharge.  CM will continue to follow as patient progresses with care towards discharge.            Expected Discharge Plan: Home/Self Care Barriers to Discharge: Continued Medical Work up  Expected Discharge Plan and Services In-house Referral: Clinical Social Work     Living arrangements for the past 2 months: Single Family Home                                       Social Determinants of Health (SDOH) Interventions SDOH Screenings   Food Insecurity: Food Insecurity Present (06/20/2023)  Housing: High Risk (06/20/2023)  Transportation Needs: Unmet Transportation Needs (06/20/2023)  Utilities: At Risk (06/20/2023)  Financial Resource Strain: Medium Risk (06/09/2023)   Received from [facility]  Physical Activity: Inactive (06/09/2023)   Received from [facility]  Social Connections: Socially Isolated (06/09/2023)   Received from [facility]  Stress: Stress Concern Present (06/09/2023)   Received from [facility]  Tobacco Use: High Risk (06/19/2023)  Health Literacy: Medium Risk (06/09/2023)   Received from [facility]    Readmission Risk Interventions    06/21/2023    2:07 PM 05/12/2023   11:42 AM 04/25/2023    9:13 AM  Readmission Risk Prevention Plan  Transportation Screening Complete Complete Complete  Medication Review (RN Care Manager) Referral to Pharmacy  Complete  PCP or Specialist appointment within 3-5 days of discharge Complete Complete Complete  HRI or Home  Care Consult Complete Complete Complete  SW Recovery Care/Counseling Consult Complete Complete Complete  Palliative Care Screening Not Applicable Not Applicable Not Applicable  Skilled Nursing Facility  Not Applicable Not Applicable

## 2023-06-23 NOTE — Progress Notes (Signed)
Occupational Therapy Treatment Patient Details Name: [name] MRN: [id number] DOB: 06/01/1970 Today's Date: 06/23/2023   History of present illness Pt is a 53 y/o F presenting to ED on 2/16 wtih colostomy bag leaking, lethargy, and hypotension, ?accidental overdose of narcotics. Admitted for septic shock related to cellulitis at ostomy site. PMH includes Chrohn's disease, COPD, collagen vascular disease, diabetes, seizure disorder, fungal endocarditis   OT comments  Pt making excellent progress towards OT goals with improving cognition, balance and endurance. Pt able to manage LB dressing tasks with Setup Assist and mobilize around unit without AD with Supervision-CGA w/ dynamic challenges. Based on progress, anticipate no OT needs at DC.      If plan is discharge home, recommend the following:  Assistance with cooking/housework;Direct supervision/assist for financial management;Direct supervision/assist for medications management;Assist for transportation;Help with stairs or ramp for entrance   Equipment Recommendations  Tub/shower seat    Recommendations for Other Services      Precautions / Restrictions Precautions Precautions: Fall Recall of Precautions/Restrictions: Intact Precaution/Restrictions Comments: ostomy Restrictions Weight Bearing Restrictions Per Provider Order: No       Mobility Bed Mobility Overal bed mobility: Modified Independent                  Transfers Overall transfer level: Independent Equipment used: None Transfers: Sit to/from Stand Sit to Stand: Independent                 Balance Overall balance assessment: Mild deficits observed, not formally tested                                         ADL either performed or assessed with clinical judgement   ADL Overall ADL's : Needs assistance/impaired                     Lower Body Dressing: Set up;Sitting/lateral leans;Sit to/from stand Lower Body  Dressing Details (indicate cue type and reason): to don mesh underwear, scrub pants standing bedside without AD             Functional mobility during ADLs: Contact guard assist;Supervision/safety General ADL Comments: able to mobilize around unit without AD, mild sway with dynamic challenges - discussed awareness and use of appropriate DME as needed    Extremity/Trunk Assessment Upper Extremity Assessment Upper Extremity Assessment: Overall WFL for tasks assessed;Right hand dominant   Lower Extremity Assessment Lower Extremity Assessment: Defer to PT evaluation        Vision   Vision Assessment?: No apparent visual deficits;Wears glasses for reading   Perception     Praxis     Communication Communication Communication: No apparent difficulties   Cognition Arousal: Alert Behavior During Therapy: WFL for tasks assessed/performed Cognition: No family/caregiver present to determine baseline             OT - Cognition Comments: pleasant, talkative but easily redirectable with cues. shows some insight into deficits and receptive to education. cognition appears improved from prior OT sessions                 Following commands: Intact        Cueing   Cueing Techniques: Verbal cues  Exercises      Shoulder Instructions       General Comments      Pertinent Vitals/ Pain       Pain Assessment  Pain Assessment: No/denies pain  Home Living                                          Prior Functioning/Environment              Frequency  Min 1X/week        Progress Toward Goals  OT Goals(current goals can now be found in the care plan section)  Progress towards OT goals: Progressing toward goals  Acute Rehab OT Goals Patient Stated Goal: home soon OT Goal Formulation: With patient Time For Goal Achievement: 07/05/23 Potential to Achieve Goals: Good ADL Goals Pt Will Perform Upper Body Dressing: with modified  independence;sitting Pt Will Perform Lower Body Dressing: with modified independence;sit to/from stand;sitting/lateral leans Pt Will Transfer to Toilet: with modified independence;ambulating;regular height toilet Pt Will Perform Tub/Shower Transfer: with modified independence;ambulating Additional ADL Goal #1: pt will tolerate OOB standing functional task x10 min in order to improve activity tolerance for ADLs  Plan      Co-evaluation                 AM-PAC OT "6 Clicks" Daily Activity     Outcome Measure   Help from another person eating meals?: None Help from another person taking care of personal grooming?: None Help from another person toileting, which includes using toliet, bedpan, or urinal?: A Little Help from another person bathing (including washing, rinsing, drying)?: A Little Help from another person to put on and taking off regular upper body clothing?: A Little Help from another person to put on and taking off regular lower body clothing?: A Little 6 Click Score: 20    End of Session Equipment Utilized During Treatment: Gait belt  OT Visit Diagnosis: Unsteadiness on feet (R26.81);Other abnormalities of gait and mobility (R26.89);Muscle weakness (generalized) (M62.81)   Activity Tolerance Patient tolerated treatment well   Patient Left in chair;with call bell/phone within reach   Nurse Communication Mobility status        Time: 9147-8295 OT Time Calculation (min): 28 min  Charges: OT General Charges $OT Visit: 1 Visit OT Treatments $Self Care/Home Management : 8-22 mins $Therapeutic Activity: 8-22 mins  Bradd Canary, OTR/L Acute Rehab Services Office: [phone]   Lorre Munroe 06/23/2023, 2:20 PM

## 2023-06-23 NOTE — Progress Notes (Addendum)
PROGRESS NOTE    Aunna Snooks  [name]:[id number] DOB: 1970-07-29 DOA: 06/19/2023 PCP: [name], PA-C   Brief Narrative: 53 year old past medical history significant for hypertension, hyperlipidemia, diabetes type 2, COPD, seizure disorder, lumbar endocarditis, anxiety, C. difficile colitis status post total colectomy with end ileostomy.  Patient presented secondary to altered mental status and found to have hypotension requiring vasopressor support in the ICU.  Managed for hypovolemic shock with improvement of blood pressure.   Assessment & Plan:   Principal Problem:   Sepsis (HCC) Active Problems:   Benzodiazepine (tranquilizer) overdose   1-Hypovolemic shock: -Patient presented with worsening hypotension on chronic hypotension.  Sepsis was ruled out Associated with hypothermia.  Patient was managed with Levophed on admission weaned off to 8/17. -Home midodrine restarted. -Continue with IV fluids.  -Continue with midodrine  2-AKI: Creatinine peak on admission 1.5 Related to hypovolemic shock Improved with IV fluids initially.  Cr increase to 1.5 (2/19). Resume IV fluids.  Improving. Continue with IV fluids  Dysphagia;  Report difficulty swallowing liquids and solid. Worse lately.  She had required esophageal dilation.  GI consulted, plan for endoscopy 2/22.  Abdominal pain Acute on chronic CT abdomen and pelvis: Changes consistent with prior colectomy and right side ileostomy.  No acute abnormality noted.  Acute toxic metabolic encephalopathy: Diazepam overdose -Presumed secondary to benzodiazepine and narcotics overdose. -Back to baseline Psych  consulted and recommendation for no inpatient admission  Right upper extremity DVT: Diagnosed October 2024 secondary to PICC with DVT located in the right internal jugular vein and right brachial veins. Eliquis held on admission. Recheck right upper extremity venous duplex: Hold eliquis in anticipation endoscopy  on 2/22. Start Heparin gtt.   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 History of Crohn's disease -Continue with IV fluids, Imodium  Normocytic  anemia: -Monitor Hb  Primary hypertension: Holding metoprolol due to hypotension  Allergic Reaction:  Patient received atarax 2/19 , nurse notice facial and neck redness, throat tightness.  Atarax is on allergy med list. She received meds yesterday as well.  Received a dose of IV pepcid and IV solumedrol.  No further symptoms.  Atarax remove from orders.    Estimated body mass index is 23.5 kg/m as calculated from the following:   Height as of this encounter: 5\' 4"  (1.626 m).   Weight as of this encounter: 62.1 kg.   DVT prophylaxis: Eliquis Code Status: Full code Family Communication: Disposition Plan:  Status is: Inpatient Remains inpatient appropriate because: management of AKI, Hypotension.     Consultants:  Psych  Procedures:  none  Antimicrobials:    Subjective: Report having facial and neck redness, maybe some throat tightness after Ataxax dose yesterday.  No symptoms today.  She report dysphagia to solid and liquid.  Stool out put decreasing.  Objective: Vitals:   06/23/23 0500 06/23/23 0600 06/23/23 0800 06/23/23 0900  BP: (!) 110/54 (!) 93/46 127/60 120/70  Pulse: (!) 102 94 (!) 104 (!) 102  Resp: 16 14 12 16   Temp:      TempSrc:      SpO2: 98% 97% 99% 100%  Weight:    62.1 kg  Height:    5\' 4"  (1.626 m)    Intake/Output Summary (Last 24 hours) at 06/23/2023 1308 Last data filed at  06/23/2023 1610 Gross per 24 hour  Intake 2710.83 ml  Output 3600 ml  Net -889.17 ml   Filed Weights   06/19/23 0205 06/23/23 0900  Weight: 55 kg 62.1 kg     Examination:  General exam: NAD Respiratory system: CTA Cardiovascular system: S 1, S 2 RRR Gastrointestinal system: BS present, soft, nt, ostomy in placed  Central nervous system: alert, follows command Extremities: Symmetric 5 x 5 power.   Data Reviewed: I have personally reviewed following labs and imaging studies  CBC: Recent Labs  Lab 06/19/23 0238 06/19/23 1201 06/20/23 0248 06/21/23 0804 06/22/23 0334  WBC 12.4* 10.9* 9.3 7.2 7.7  HGB 10.2* 10.9* 8.5* 9.8* 8.9*  HCT 30.4* 33.1* 25.4* 29.4* 27.2*  MCV 84.7 84.4 84.1 84.2 85.3  PLT 586* 666* 540* 608* 539*   Basic Metabolic Panel: Recent Labs  Lab 06/19/23 0238 06/20/23 0248 06/21/23 0307 06/22/23 0334 06/23/23 0310  NA 132* 135 133* 137 136  K 3.9 3.5 4.0 4.1 5.0  CL 99 106 102 107 104  CO2 22 19* 22 19* 24  GLUCOSE 89 82 84 93 111*  BUN 14 6 8 7  5*  CREATININE 1.55* 1.10* 1.30* 1.53* 0.99  CALCIUM 8.8* 8.0* 8.4* 8.8* 9.2  MG  --  0.9* 1.9  --   --   PHOS  --  3.9  --   --   --    GFR: Estimated Creatinine Clearance: 56.1 mL/min (by C-G formula based on SCr of 0.99 mg/dL). Liver Function Tests: Recent Labs  Lab 06/19/23 0238  AST 24  ALT 15  ALKPHOS 95  BILITOT 0.3  PROT 6.5  ALBUMIN 3.3*   Recent Labs  Lab 06/19/23 0238  LIPASE 32   No results for input(s): "AMMONIA" in the last 168 hours. Coagulation Profile: Recent Labs  Lab 06/19/23 0238  INR 1.0   Cardiac Enzymes: No results for input(s): "CKTOTAL", "CKMB", "CKMBINDEX", "TROPONINI" in the last 168 hours. BNP (last 3 results) No results for input(s): "PROBNP" in the last 8760 hours. HbA1C: No results for input(s): "HGBA1C" in the last 72 hours. CBG: Recent Labs  Lab 06/19/23 0652 06/19/23 0839 06/19/23 0925 06/19/23 1156 06/19/23 1530  GLUCAP 70 93 94 90 95   Lipid Profile: Recent Labs    06/22/23 0334  CHOL 145  HDL 53  LDLCALC 31  TRIG 306*  CHOLHDL 2.7   Thyroid Function Tests: No results for input(s):  "TSH", "T4TOTAL", "FREET4", "T3FREE", "THYROIDAB" in the last 72 hours. Anemia Panel: Recent Labs    06/22/23 0811  VITAMINB12 196  FOLATE 8.2  FERRITIN 94  TIBC 358  IRON 83  RETICCTPCT 1.6   Sepsis Labs: Recent Labs  Lab 06/19/23 0238 06/19/23 0418 06/19/23 1201  PROCALCITON  --   --  <0.10  LATICACIDVEN 1.5 2.5* 1.1    Recent Results (from the past 240 hours)  Blood Culture (routine x 2)     Status: None (Preliminary result)   Collection Time: 06/19/23  2:38 AM   Specimen: BLOOD  Result Value Ref Range Status   Specimen Description BLOOD BLOOD LEFT ARM  Final   Special Requests NONE  Final   Culture   Final    NO GROWTH 4 DAYS Performed at [facility], [address], [place], [place] [address]    Report Status PENDING  Incomplete  Blood Culture (routine x 2)     Status: None (Preliminary result)   Collection Time: 06/19/23  2:59 AM  Specimen: BLOOD RIGHT HAND  Result Value Ref Range Status   Specimen Description BLOOD RIGHT HAND  Final   Special Requests   Final    BOTTLES DRAWN AEROBIC ONLY Blood Culture results may not be optimal due to an inadequate volume of blood received in culture bottles   Culture   Final    NO GROWTH 4 DAYS Performed at [facility], [address], [place], [place] [address]    Report Status PENDING  Incomplete  MRSA Next Gen by PCR, Nasal     Status: None   Collection Time: 06/19/23  9:42 AM   Specimen: Nasal Mucosa; Nasal Swab  Result Value Ref Range Status   MRSA by PCR Next Gen NOT DETECTED NOT DETECTED Final    Comment: (NOTE) The GeneXpert MRSA Assay (FDA approved for NASAL specimens only), is one component of a comprehensive MRSA colonization surveillance program. It is not intended to diagnose MRSA infection nor to guide or monitor treatment for MRSA infections. Test performance is not FDA approved in patients less than 57 years old. Performed at [facility], [address], [place], [place] [address]           Radiology Studies: VAS [place] UPPER EXTREMITY VENOUS DUPLEX Result Date: 06/21/2023 UPPER VENOUS STUDY  Patient Name:  HAROLD MATTES [facility]  Date of Exam:   06/21/2023 Medical Rec #: [id number]           Accession #:    [id number] Date of Birth: 1971/04/30           Patient Gender: F Patient Age:   79 years Exam Location:  [facility] Procedure:      VAS [place] UPPER EXTREMITY VENOUS DUPLEX Referring Phys: RALPH NETTEY --------------------------------------------------------------------------------  Indications: History of DVT Anticoagulation: Eliquis prior to admission. Comparison Study: Previous exam on 02/19/2023 positive for DVT in RUE (IJV &                   brachial vein) Performing Technologist: Ernestene Mention RVT, RDMS  Examination Guidelines: A complete evaluation includes B-mode imaging, spectral Doppler, color Doppler, and power Doppler as needed of all accessible portions of each vessel. Bilateral testing is considered an integral part of a complete examination. Limited examinations for reoccurring indications may be performed as noted.  Right Findings: +----------+------------+---------+-----------+----------+-------------------+ RIGHT     CompressiblePhasicitySpontaneousProperties      Summary       +----------+------------+---------+-----------+----------+-------------------+ IJV         Partial      No        No                     Chronic       +----------+------------+---------+-----------+----------+-------------------+ Subclavian    Full       Yes       Yes                                  +----------+------------+---------+-----------+----------+-------------------+ Axillary      Full       Yes       Yes                                  +----------+------------+---------+-----------+----------+-------------------+ Brachial      Full       Yes       Yes                                   +----------+------------+---------+-----------+----------+-------------------+  Radial        Full                                                      +----------+------------+---------+-----------+----------+-------------------+ Ulnar         Full                                  Not well visualized +----------+------------+---------+-----------+----------+-------------------+ Cephalic                                              Not visualized    +----------+------------+---------+-----------+----------+-------------------+ Basilic       Full       Yes       Yes                                  +----------+------------+---------+-----------+----------+-------------------+ Proximal IJV os chronically occluded, however mid and distal portions are patent with phasic flow.  Left Findings: +----------+------------+---------+-----------+----------+-------+ LEFT      CompressiblePhasicitySpontaneousPropertiesSummary +----------+------------+---------+-----------+----------+-------+ Subclavian    Full       Yes       Yes                      +----------+------------+---------+-----------+----------+-------+  Summary:  Right: No evidence of superficial vein thrombosis in the upper extremity. 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  Left: No evidence of thrombosis in the subclavian.  *See table(s) above for measurements and observations.  Diagnosing physician: Lemar Livings MD Electronically signed by Lemar Livings MD on 06/21/2023 at 4:35:38 PM.    Final         Scheduled Meds:  amitriptyline  100 mg Oral QHS   apixaban  5 mg Oral BID   Chlorhexidine Gluconate Cloth  6 each Topical Daily   famotidine  20 mg Oral BID   folic acid  1 mg Oral Daily   gabapentin  300 mg Oral BID   Gerhardt's butt cream   Topical BID   levETIRAcetam  750 mg Oral BID   loperamide  2 mg Oral 4 times per day   metoprolol tartrate  12.5  mg Oral BID   midodrine  5 mg Oral Q8H   thiamine  100 mg Oral Daily   Continuous Infusions:  lactated ringers       LOS: 4 days    Time spent: 35 minutes    Zaelyn Barbary A Nyara Capell, MD [facility]   If 7PM-7AM, please contact night-coverage [email or web address]  06/23/2023, 9:58 AM

## 2023-06-23 NOTE — Consult Note (Signed)
Referring Provider: Dr. Hartley Barefoot Primary Care Physician:  [name], PA-C Primary Gastroenterologist:  Dr. Jena Gauss and [facility]  Reason for Consultation:  Dysphagia   HPI: [name] is a 53 y.o. 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  She presented to [facility] ED 06/19/2023 with high colostomy output and leakage from the collection bag with altered mental status. She was hypotensive and initial labs showed a s WBC count of 12.4.  Hemoglobin 10.2.  NA+ 132, Cr. 1.55, lactic acid 1.5  -> 2.5.  She received Narcan without improvement.  Head CT was negative.  CTAP was unrevealing.  She received aggressive IV hydration and was started on Norepinephrine then transferred to [facility] for further management of suspected septic shock.   A GI consult was requested for further evaluation regarding dysphagia.  She has a history of GERD and dysphagia in the past.  She endorses having heartburn 3 to 4 days weekly and dysphagia started prior colectomy 02/2023 which has progressively worsened since then.  She describes having solid food like bread, pills and sometimes liquid which gets stuck to the throat/upper esophagus approximately 4 days weekly.  She sometimes takes a few sips of water and the stuck food passes and other times the stuck food or pills come back up.  It takes more effort to swallow, sometimes takes 2 swallows for food to pass.  No nausea or vomiting.  She has chronic generalized abdominal pain.  She takes Advil 200 mg 2 tabs 2-3 times daily for the past 3 to 4 months.  She previously took Protonix 40 mg daily  for many years and 2 to 4 weeks ago she stopped taking it because she did not think it was working and started an OTC acid reducing medication which she takes 3 to 4 days weekly for heartburn.  Her most recent EGD with esophageal dilatation was done by Dr. Karilyn Cota 10/2014.  She denies any history of candidiasis esophagitis.  She passes large volume of ostomy output daily at home and throughout this hospitalization.  At home, she empties her ostomy bag 4-5 times daily.  No bloody output.  He had over 2 L of ostomy output over the past 24 hours.  She was started on loperamide 2 g p.o. 4 times daily and there is currently a moderate amount of brown mushy stool with some consistency in the ostomy bag with at least 500 cc of liquid stool in the lower (foley) collection bag.  She was previously on infliximab then adalimumab which were discontinued secondary to developing associated rash.  She is no longer on Skyrizi since she underwent a colectomy. She underwent a flexible sigmoidoscopy by Dr. Jena Gauss 04/08/2023 which showed diffuse proctitis involving the rectal stump with a benign-appearing rectal stricture.  Biopsy showed predominant ulcer with granulation tissue and limited colonic mucosa without granulomata, dysplasia or malignancy.  She was previously seen by our inpatient GI service 01/27/2023 after she was transferred from [facility] ED to [facility] after she was found unresponsive at home with coffee-ground emesis and melena. She briefly arrested, received CPR and epi with ROSC.  She was intubated and received 1 unit of PRBCs at [facility]  ED then transferred to [facility] in septic shock required pressors. An EGD was deferred to allow time for the patient to recover from presumed septic/hypovolemic shock and her hemoglobin level remained stable.  Her hospital course was further complicated by C. difficile colitis with toxic megacolon which eventually required a colectomy with ostomy placement 02/11/2023.   She was discharged home 03/19/2023.  GI PROCEDURES:  Flexible sigmoidoscopy 04/08/2023 by Dr. Jena Gauss - Diffuse proctitis involving the rectal stump benign appearing rectal stricture. Biopsies taken. Differential includes active inflammatory bowel disease +/- diversion colitis.  Rectal stump biopsies: Predominant ulcer with granulation tissue and limited colonic mucosa.  Negative for granuloma, dysplasia or malignancy.  Total colectomy 03/13/2023: Path report showed pseudomembranous colitis with mucosal necrosis.  Marked acute and chronic fibrinous serositis.  Mucosal ulceration necrosis with transmural inflammation and chronic cervicitis present in the margins.  2 benign pericolic lymph nodes.  Flexible sigmoidoscopy 02/10/2023: - Diffuse severe inflammation was found in the rectum and in the sigmoid colon.  - No specimens collected. This severe pseudomembranous colitis appears to be a combination of longstanding Crohn's disease that has thus far failed medical management, active C. difficile infection and possible component of ischemia from the patient's cardiovascular collapse at the time of admission 2 weeks ago. There is no way of determining by appearance (nor biopsies if they were to have been taking) how much each of these conditions is contributing to the overall picture. This patient's has a persistent septic picture, partial colonic pseudoobstruction and is failing medical management.  Flexible sigmoidoscopy 02/02/2023: - Stool in the recto-sigmoid colon and in the sigmoid colon.  - Diffuse severe mucosal changes were found in the rectum, in the recto-sigmoid colon and in the distal sigmoid colon consistent with Crohn's disease and likely consistent with infectious colitis.  Colonoscopy 07/15/2021 by Dr. Karilyn Cota: - Preparation of the colon was poor resulting in incomplete examination. - Formed stool noted in rectum and sigmoid colon.  - Rectal stricture found on digital rectal exam.  Stricture is noncritical.  - Crohn' s disease with colonic involvement. Inflammation was found. This was severe. Biopsied.    FINAL MICROSCOPIC DIAGNOSIS:   A. COLON, RECTUM AND SIGMOID, BIOPSY:  -  Chronic active colitis with ulceration  -  Detached necroinflammatory debris  -  No granulomata, dysplasia or malignancy identified   Flexible sigmoidoscopy 02/19/2021: - Diffuse severe inflammation was found in the rectum and in the sigmoid colon secondary to colitis. Biopsied. - Stricture in the sigmoid colon. Dilated. Path report showed chronic active colitis without granulomata, dysplasia or malignancy    EGD by Dr. Jena Gauss 01/2007: small hiatal hernia s/p 94 french maloney   EGD/ED June 2016: -Small hiatal hernia -Esophagus dilated with 56 French Maloney dilator   Colonoscopy January 2010: -Anal papillae, internal hemorrhoids, normal colon, normal terminal ileum -Poor prep but certainly no evidence of appendectomy ED   EGD/ED May 2008: -Normal esophagus, status post passage of 56 [place] Maloney dilator -Small hiatal hernia -Otherwise normal stomach, D1, D2   EGD/colonoscopy July 2007: -Normal esophagus, stomach, D1, D2 -Anal Papillae today and internal hemorrhoids, otherwise normal rectum and colon.  Normal terminal ileum   EGD/colonoscopy 2002 [facility] Reports not available Pathology with negative small bowel biopsy.  Hyperplastic rectal polyp    Past Medical History:  Diagnosis Date   Anxiety    Asthma    Chronic low back pain    Collagen vascular disease (HCC)    COPD (chronic obstructive pulmonary disease) (HCC)  Crohn's disease (HCC)    Fungal endocarditis 03/28/2023   GERD (gastroesophageal reflux disease)    EGD 01/2007 by Dr.Rourke small hiatal hernia s/p 56 french maloney    History of head injury    Hypertension    IBS (irritable bowel syndrome)    PSVT (paroxysmal supraventricular tachycardia) (HCC)    PTSD (post-traumatic stress disorder)    Recurrent  chest pain    Seizure disorder (HCC)    Type 2 diabetes mellitus (HCC)     Past Surgical History:  Procedure Laterality Date   ABDOMINAL HYSTERECTOMY     with right salpingo oophorectomy 2005   APPENDECTOMY  2006   BALLOON DILATION N/A 02/19/2021   Procedure: BALLOON DILATION;  Surgeon: Lanelle Bal, DO;  [facility];  Service: Endoscopy;  Laterality: N/A;  Sigmoid colon stricture   BIOPSY  02/06/2021   Procedure: BIOPSY;  Surgeon: Lanelle Bal, DO;  [facility];  Service: Endoscopy;;   BIOPSY  02/19/2021   Procedure: BIOPSY;  Surgeon: Lanelle Bal, DO;  [facility];  Service: Endoscopy;;   BIOPSY  07/15/2021   Procedure: BIOPSY;  Surgeon: Malissa Hippo, MD;  [facility];  Service: Endoscopy;;   BIOPSY  04/08/2023   Procedure: BIOPSY;  Surgeon: Corbin Ade, MD;  [facility];  Service: Endoscopy;;   CESAREAN SECTION     1996   CHOLECYSTECTOMY     COLECTOMY N/A 02/11/2023   Procedure: TOTAL COLECTOMY;  Surgeon: Harriette Bouillon, MD;  Location: [facility] OR;  Service: General;  Laterality: N/A;   COLONOSCOPY  2010   Dr. Jena Gauss; negative except for hemorrhoids   COLOSTOMY  02/11/2023   Procedure: COLOSTOMY;  Surgeon: Harriette Bouillon, MD;  Location: [facility] OR;  Service: General;;   ESOPHAGEAL DILATION N/A 10/25/2014   Procedure: ESOPHAGEAL DILATION;  Surgeon: Malissa Hippo, MD;  [facility];  Service: Endoscopy;  Laterality: N/A;   ESOPHAGOGASTRODUODENOSCOPY N/A 10/25/2014   Procedure: ESOPHAGOGASTRODUODENOSCOPY (EGD);  Surgeon: Malissa Hippo, MD;  [facility];  Service: Endoscopy;  Laterality: N/A;  1250   FLEXIBLE SIGMOIDOSCOPY N/A 02/06/2021   Procedure: FLEXIBLE SIGMOIDOSCOPY;  Surgeon: Lanelle Bal, DO;  [facility];  Service: Endoscopy;  Laterality: N/A;   FLEXIBLE SIGMOIDOSCOPY N/A 02/19/2021   Procedure: FLEXIBLE SIGMOIDOSCOPY;  Surgeon: Lanelle Bal, DO;  [facility];  Service: Endoscopy;  Laterality: N/A;   FLEXIBLE SIGMOIDOSCOPY N/A 02/02/2023   Procedure: FLEXIBLE SIGMOIDOSCOPY;  Surgeon: Beverley Fiedler, MD;  Location: [facility] ENDOSCOPY;  Service: Gastroenterology;  Laterality: N/A;   FLEXIBLE SIGMOIDOSCOPY N/A 02/10/2023   Procedure: FLEXIBLE SIGMOIDOSCOPY;  Surgeon: Sherrilyn Rist, MD;  Location: [facility] ENDOSCOPY;  Service: Gastroenterology;  Laterality: N/A;   FLEXIBLE SIGMOIDOSCOPY N/A 04/08/2023   Procedure: FLEXIBLE SIGMOIDOSCOPY;  Surgeon: Corbin Ade, MD;  [facility];  Service: Endoscopy;  Laterality: N/A;   IR FLUORO GUIDE CV LINE LEFT  02/21/2023   IR US GUIDE VASC ACCESS LEFT  02/21/2023   LAPAROTOMY N/A 02/11/2023   Procedure: EXPLORATORY LAPAROTOMY;  Surgeon: Harriette Bouillon, MD;  Location: [facility];  Service: General;  Laterality: N/A;   OOPHORECTOMY     left for torsion and ovarian fibroma; uterine myoma resected 1995   SIGMOIDOSCOPY  07/15/2021   Procedure: SIGMOIDOSCOPY;  Surgeon: Malissa Hippo, MD;  [facility];  Service: Endoscopy;;   TRANSESOPHAGEAL ECHOCARDIOGRAM (CATH LAB) N/A 02/18/2023  Procedure: TRANSESOPHAGEAL ECHOCARDIOGRAM;  Surgeon: Sande Rives, MD;  Location: [facility] INVASIVE CV LAB;  Service: Cardiovascular;  Laterality: N/A;    Prior to Admission medications   Medication Sig Start Date End Date Taking? Authorizing Provider  albuterol (ACCUNEB) 1.25 MG/3ML nebulizer solution Take 1 ampule by nebulization every 6 (six) hours as needed for wheezing or shortness of breath. 04/21/23  Yes [provider]  ALPRAZolam Prudy Feeler) 1 MG tablet Take 1 mg by mouth 3 (three) times daily. 07/11/19  Yes [provider]  amitriptyline (ELAVIL) 100 MG tablet Take 100 mg by mouth at bedtime.   Yes [provider]  apixaban (ELIQUIS) 2.5 MG TABS tablet Take 1 tablet (2.5 mg total) by mouth 2 (two) times daily. 04/25/23  Yes Johnson, Clanford L, MD  cetirizine (ZYRTEC) 10 MG  tablet Take 10 mg by mouth daily. 01/08/22  Yes [provider]  cyclobenzaprine (FLEXERIL) 10 MG tablet Take 10 mg by mouth 3 (three) times daily as needed for muscle spasms. 06/10/23  Yes [provider]  DULoxetine (CYMBALTA) 30 MG capsule Take 1 capsule (30 mg total) by mouth daily. 04/25/23  Yes Johnson, Clanford L, MD  feeding supplement (ENSURE ENLIVE / ENSURE PLUS) LIQD Take 237 mLs by mouth 2 (two) times daily between meals. 01/04/22  Yes Vassie Loll, MD  gabapentin (NEURONTIN) 300 MG capsule Take 300 mg by mouth 2 (two) times daily. 10/15/22  Yes [provider]  HYDROmorphone (DILAUDID) 2 MG tablet Take 1 tablet (2 mg total) by mouth every 6 (six) hours as needed for severe pain (pain score 7-10). 05/12/23  Yes [name], Pratik D, DO  hydrOXYzine (ATARAX) 50 MG tablet Take 25-50 mg by mouth 3 (three) times daily as needed for anxiety or nausea.   Yes [provider]  ibuprofen (ADVIL) 200 MG tablet Take 200 mg by mouth every 6 (six) hours as needed for mild pain (pain score 1-3).   Yes [provider]  levETIRAcetam (KEPPRA) 750 MG tablet Take 750 mg by mouth 2 (two) times daily. 09/30/20  Yes [provider]  loperamide (IMODIUM) 2 MG capsule Take 1 capsule (2 mg total) by mouth every 6 (six) hours. 04/25/23  Yes Johnson, Clanford L, MD  metoprolol tartrate (LOPRESSOR) 25 MG tablet Take 0.5 tablets (12.5 mg total) by mouth 2 (two) times daily. 04/10/23  Yes Johnson, Clanford L, MD  midodrine (PROAMATINE) 5 MG tablet Take 1 tablet (5 mg total) by mouth 3 (three) times daily with meals. 04/10/23  Yes Johnson, Clanford L, MD  nitroGLYCERIN (NITROSTAT) 0.4 MG SL tablet Place 1 tablet (0.4 mg total) under the tongue every 5 (five) minutes as needed for chest pain. 04/25/23  Yes Johnson, Clanford L, MD  nystatin (MYCOSTATIN/NYSTOP) powder Apply topically 3 (three) times daily. 05/12/23  Yes [name], Pratik D, DO  ondansetron (ZOFRAN) 4 MG tablet Take 1 tablet  (4 mg total) by mouth every 6 (six) hours as needed for nausea. 05/12/23  Yes [name], Pratik D, DO  pantoprazole (PROTONIX) 40 MG tablet Take 1 tablet (40 mg total) by mouth 2 (two) times daily. 01/03/22  Yes Vassie Loll, MD  prochlorperazine (COMPAZINE) 5 MG tablet Take 1 tablet (5 mg total) by mouth every 6 (six) hours as needed for refractory nausea / vomiting. 05/12/23  Yes Sherryll Burger, Pratik D, DO  mesalamine (ROWASA) 4 g enema Place 60 mLs (4 g total) rectally at bedtime. Patient not taking: Reported on 06/20/2023 04/22/23   Corbin Ade, MD  Current Facility-Administered Medications  Medication Dose Route Frequency Provider Last Rate Last Admin   acetaminophen (TYLENOL) tablet 650 mg  650 mg Oral Q6H PRN Marcelle Smiling, MD   650 mg at 06/23/23 0325   ALPRAZolam Prudy Feeler) tablet 0.25 mg  0.25 mg Oral TID PRN Marcelle Smiling, MD   0.25 mg at 06/23/23 0846   amitriptyline (ELAVIL) tablet 100 mg  100 mg Oral QHS Marcelle Smiling, MD   100 mg at 06/22/23 2111   apixaban (ELIQUIS) tablet 5 mg  5 mg Oral BID Dang, Thuy D, RPH   5 mg at 06/23/23 2440   Chlorhexidine Gluconate Cloth 2 % PADS 6 each  6 each Topical Daily Janeann Forehand D, NP   6 each at 06/22/23 1027   cyclobenzaprine (FLEXERIL) tablet 10 mg  10 mg Oral TID PRN Narda Bonds, MD   10 mg at 06/23/23 0846   docusate sodium (COLACE) capsule 100 mg  100 mg Oral BID PRN Janeann Forehand D, NP       famotidine (PEPCID) tablet 20 mg  20 mg Oral BID Regalado, Belkys A, MD       folic acid (FOLVITE) tablet 1 mg  1 mg Oral Daily Marcelle Smiling, MD   1 mg at 06/23/23 2536   gabapentin (NEURONTIN) capsule 300 mg  300 mg Oral BID Marcelle Smiling, MD   300 mg at 06/23/23 6440   Gerhardt's butt cream   Topical BID Larinda Buttery, MD   Given at 06/22/23 2112   HYDROmorphone (DILAUDID) tablet 2 mg  2 mg Oral Q6H PRN Narda Bonds, MD   2 mg at 06/23/23 0846   ibuprofen (ADVIL) tablet 400 mg  400 mg Oral Q6H PRN Larinda Buttery, MD   400 mg at 06/23/23  0416   lactated ringers infusion   Intravenous Continuous Regalado, Belkys A, MD       levETIRAcetam (KEPPRA) tablet 750 mg  750 mg Oral BID Marcelle Smiling, MD   750 mg at 06/23/23 3474   loperamide (IMODIUM) capsule 2 mg  2 mg Oral 4 times per day Roslyn Smiling, [facility]   2 mg at 06/23/23 0856   metoprolol tartrate (LOPRESSOR) tablet 12.5 mg  12.5 mg Oral BID Regalado, Belkys A, MD   12.5 mg at 06/23/23 0855   midodrine (PROAMATINE) tablet 5 mg  5 mg Oral Q8H Marcelle Smiling, MD   5 mg at 06/23/23 0646   naphazoline-pheniramine (NAPHCON-A) 0.025-0.3 % ophthalmic solution 1 drop  1 drop Both Eyes QID PRN Narda Bonds, MD   1 drop at 06/22/23 2112   ondansetron (ZOFRAN) injection 4 mg  4 mg Intravenous Q6H PRN Narda Bonds, MD   4 mg at 06/22/23 2228   Or   ondansetron (ZOFRAN) tablet 4 mg  4 mg Oral Q6H PRN Narda Bonds, MD       polyethylene glycol (MIRALAX / GLYCOLAX) packet 17 g  17 g Oral Daily PRN Janeann Forehand D, NP       thiamine (VITAMIN B1) tablet 100 mg  100 mg Oral Daily Marcelle Smiling, MD   100 mg at 06/23/23 2595    Allergies as of 06/19/2023 - Review Complete 06/19/2023  Allergen Reaction Noted   Codeine Shortness Of Breath, Swelling, and Rash    Hydrocodone Shortness Of Breath, Swelling, and Rash 12/26/2012   Ketorolac Nausea And Vomiting, Swelling, and Nausea Only 06/18/2022   Morphine and codeine Shortness Of Breath, Swelling, and Rash 12/26/2012  Penicillins Shortness Of Breath, Swelling, and Other (See Comments)    Nickel Rash 11/19/2021   Pregabalin Other (See Comments) 06/18/2021   Acetaminophen  12/26/2012   Atarax [hydroxyzine] Itching and Swelling 02/23/2023   Ativan [lorazepam] Other (See Comments) 12/20/2016   Oxycodone-acetaminophen Nausea Only and Nausea And Vomiting 03/22/2023   Strawberry extract Swelling 07/22/2011   Watermelon concentrate Swelling 07/22/2011   Albuterol Palpitations 07/08/2021   Benadryl [diphenhydramine hcl] Palpitations  09/23/2011   Humira [adalimumab] Rash 12/24/2021   Latex Rash    Remicade [infliximab] Rash 07/08/2021   Tramadol Rash 01/12/2022    Family History  Problem Relation Age of Onset   Cancer Mother    Heart failure Mother    Hypertension Mother    Heart attack Mother    Heart attack Father 87   Cancer Father 55   Heart failure Father    Diabetes Father    Crohn's disease Cousin     Social History   Socioeconomic History   Marital status: Divorced    Spouse name: Not on file   Number of children: 1   Years of education: Not on file   Highest education level: Not on file  Occupational History   Not on file  Tobacco Use   Smoking status: Every Day    Current packs/day: 0.50    Average packs/day: 0.5 packs/day for 38.7 years (19.4 ttl pk-yrs)    Types: Cigarettes    Start date: 09/26/1984    Passive exposure: Current   Smokeless tobacco: Never  Vaping Use   Vaping status: Never Used  Substance and Sexual Activity   Alcohol use: No    Alcohol/week: 0.0 standard drinks of alcohol   Drug use: Yes    Types: Marijuana   Sexual activity: Yes    Birth control/protection: Surgical  Other Topics Concern   Not on file  Social History Narrative   Not on file   Social Drivers of Health   Financial Resource Strain: Medium Risk (06/09/2023)   Received from [facility]   Overall Financial Resource Strain (CARDIA)    Difficulty of Paying Living Expenses: Somewhat hard  Food Insecurity: Food Insecurity Present (06/20/2023)   Hunger Vital Sign    Worried About Running Out of Food in the Last Year: Sometimes true    Ran Out of Food in the Last Year: Sometimes true  Transportation Needs: Unmet Transportation Needs (06/20/2023)   PRAPARE - Transportation    Lack of Transportation (Medical): Yes    Lack of Transportation (Non-Medical): Yes  Physical Activity: Inactive (06/09/2023)   Received from [facility]   Exercise Vital Sign    Days of Exercise per Week: 0 days     Minutes of Exercise per Session: 0 min  Stress: Stress Concern Present (06/09/2023)   Received from [facility] - Occupational Stress Questionnaire    Feeling of Stress : To some extent  Social Connections: Socially Isolated (06/09/2023)   Received from [facility]   Social Connection and Isolation Panel [NHANES]    Frequency of Communication with Friends and Family: Twice a week    Frequency of Social Gatherings with Friends and Family: Twice a week    Attends Religious Services: Never    Database administrator or Organizations: No    Attends Banker Meetings: Never    Marital Status: Divorced  Catering manager Violence: Not At Risk (06/20/2023)   Humiliation, Afraid,  Rape, and Kick questionnaire    Fear of Current or Ex-Partner: No    Emotionally Abused: No    Physically Abused: No    Sexually Abused: No   Review of Systems: Gen: Denies fever, sweats or chills. No weight loss.  CV: Denies chest pain, palpitations or edema. Resp: Denies cough, shortness of breath of hemoptysis.  GI: See HPI.   GU : Denies urinary burning, blood in urine, increased urinary frequency or incontinence. MS: Denies joint pain, muscles aches or weakness. Derm: Denies rash, itchiness, skin lesions or unhealing ulcers. Psych: Denies depression, anxiety, memory loss or confusion. Heme: Denies easy bruising, bleeding. Neuro:  Denies headaches, dizziness or paresthesias. Endo:  Denies any problems with DM, thyroid or adrenal function.  Physical Exam: Vital signs in last 24 hours: Temp:  [97.5 F (36.4 C)-97.7 F (36.5 C)] 97.6 F (36.4 C) (02/20 0000) Pulse Rate:  [91-112] 102 (02/20 0900) Resp:  [11-28] 16 (02/20 0900) BP: (93-134)/(35-82) 120/70 (02/20 0900) SpO2:  [96 %-100 %] 100 % (02/20 0900) Weight:  [62.1 kg] 62.1 kg (02/20 0900) Last BM Date : 06/23/23 General: Alert 53 year old female in no acute distress. Head:  Normocephalic  and atraumatic. Eyes:  No scleral icterus. Conjunctiva pink. Ears:  Normal auditory acuity. Nose:  No deformity, discharge or lesions. Mouth:  Dentition intact. No ulcers or lesions.  Neck:  Supple. No lymphadenopathy or thyromegaly.  Lungs: Breath sounds clear throughout. No wheezes, rhonchi or crackles.  Heart: Rate and rhythm, no murmurs. Abdomen: Abdomen soft, nondistended.  Positive bowel sounds to all 4 quadrants.  Ostomy stoma pink with a moderate amount of brown mushy stool in the collection bag with a large amount of watery brown stool in the lower collection bag.  Approximately 2 L of ostomy output in the past 24 hours. Rectal: Deferred. Musculoskeletal:  Symmetrical without gross deformities.  Pulses:  Normal pulses noted. Extremities:  Without clubbing or edema. Neurologic:  Alert and  oriented x 4. No focal deficits.  Skin:  Intact without significant lesions or rashes. Psych:  Alert and cooperative. Normal mood and affect.  Intake/Output from previous day: 02/19 0701 - 02/20 0700 In: 2652.4 [P.O.:480; I.V.:2122.4; IV Piggyback:50] Out: 4050 [Urine:1650; Stool:2400] Intake/Output this shift: Total I/O In: 299.6 [P.O.:200; I.V.:99.6] Out: 500 [Urine:500]  Lab Results: Recent Labs    06/21/23 0804 06/22/23 0334  WBC 7.2 7.7  HGB 9.8* 8.9*  HCT 29.4* 27.2*  PLT 608* 539*   BMET Recent Labs    06/21/23 0307 06/22/23 0334 06/23/23 0310  NA 133* 137 136  K 4.0 4.1 5.0  CL 102 107 104  CO2 22 19* 24  GLUCOSE 84 93 111*  BUN 8 7 5*  CREATININE 1.30* 1.53* 0.99  CALCIUM 8.4* 8.8* 9.2   LFT No results for input(s): "PROT", "ALBUMIN", "AST", "ALT", "ALKPHOS", "BILITOT", "BILIDIR", "IBILI" in the last 72 hours. PT/INR No results for input(s): "LABPROT", "INR" in the last 72 hours. Hepatitis Panel No results for input(s): "HEPBSAG", "HCVAB", "HEPAIGM", "HEPBIGM" in the last 72 hours.    Studies/Results: VAS [place] UPPER EXTREMITY VENOUS DUPLEX Result Date:  06/21/2023 UPPER VENOUS STUDY  Patient Name:  [name]  Date of Exam:   06/21/2023 Medical Rec #: [id number]           Accession #:    [id number] Date of Birth: [birth year]           Patient Gender: F Patient Age:   60 years Exam Location:  [facility] Procedure:      VAS [place] UPPER EXTREMITY VENOUS DUPLEX Referring Phys: RALPH NETTEY --------------------------------------------------------------------------------  Indications: History of DVT Anticoagulation: Eliquis prior to admission. Comparison Study: Previous exam on 02/19/2023 positive for DVT in RUE (IJV &                   brachial vein) Performing Technologist: Ernestene Mention RVT, RDMS  Examination Guidelines: A complete evaluation includes B-mode imaging, spectral Doppler, color Doppler, and power Doppler as needed of all accessible portions of each vessel. Bilateral testing is considered an integral part of a complete examination. Limited examinations for reoccurring indications may be performed as noted.  Right Findings: +----------+------------+---------+-----------+----------+-------------------+ RIGHT     CompressiblePhasicitySpontaneousProperties      Summary       +----------+------------+---------+-----------+----------+-------------------+ IJV         Partial      No        No                     Chronic       +----------+------------+---------+-----------+----------+-------------------+ Subclavian    Full       Yes       Yes                                  +----------+------------+---------+-----------+----------+-------------------+ Axillary      Full       Yes       Yes                                  +----------+------------+---------+-----------+----------+-------------------+ Brachial      Full       Yes       Yes                                  +----------+------------+---------+-----------+----------+-------------------+ Radial        Full                                                       +----------+------------+---------+-----------+----------+-------------------+ Ulnar         Full                                  Not well visualized +----------+------------+---------+-----------+----------+-------------------+ Cephalic                                              Not visualized    +----------+------------+---------+-----------+----------+-------------------+ Basilic       Full       Yes       Yes                                  +----------+------------+---------+-----------+----------+-------------------+ Proximal IJV os chronically occluded, however mid and distal portions are patent with phasic flow.  Left Findings: +----------+------------+---------+-----------+----------+-------+ LEFT      CompressiblePhasicitySpontaneousPropertiesSummary +----------+------------+---------+-----------+----------+-------+ Subclavian  Full       Yes       Yes                      +----------+------------+---------+-----------+----------+-------+  Summary:  Right: No evidence of superficial vein thrombosis in the upper extremity. 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  Left: No evidence of thrombosis in the subclavian.  *See table(s) above for measurements and observations.  Diagnosing physician: Lemar Livings MD Electronically signed by Lemar Livings MD on 06/21/2023 at 4:35:38 PM.    Final     IMPRESSION/PLAN:  53 year old female admitted to the hospital with altered mental status and suspected septic shock.  Received aggressive IV fluids and Levophed which was weaned off.  On Midodrine. -Management per the hospitalist  History of GERD with recurrent dysphagia. Dysphagia has progressively worsened over the past 4 months.  On Solu-Medrol which may increase the risk of esophageal candidiasis. -Famotidine 20 mg p.o. 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  Excessively high ostomy output.  Stool is starting to firm up a bit on loperamide 2 mg 4 times daily. -Continue Loperamide 2 mg p.o. 4 times daily -Continue to monitor ostomy output -Consider Octreotide if high ostomy output persists  History of Crohn's disease  Chronic generalized abdominal pain.  CTAP 06/19/2023 without acute intraabdominal/pelvic pathology. -Pain management per the hospitalist  Acute on chronic anemia. Admission hemoglobin 10.2 -> 9.8 -> 8.9.  Iron 83.  TIBC 358.  Ferritin 94. No overt GI bleeding  Thrombocytosis  DVT  right internal jugular and brachial vein on Eliquis -Hold Eliquis for EGD 06/25/2023  AKI   Arnaldo Natal  06/23/2023, 12:06PM

## 2023-06-23 NOTE — Plan of Care (Signed)

## 2023-06-23 NOTE — Progress Notes (Signed)
PHARMACY - ANTICOAGULATION CONSULT NOTE  Pharmacy Consult:  Heparin Indication: Chronic DVT  Allergies  Allergen Reactions   Codeine Shortness Of Breath, Swelling and Rash    Throat swelling   Hydrocodone Shortness Of Breath, Swelling and Rash   Ketorolac Nausea And Vomiting, Swelling and Nausea Only    Pt reports n/v and swelling to throat   Morphine And Codeine Shortness Of Breath, Swelling and Rash   Penicillins Shortness Of Breath, Swelling and Other (See Comments)    Throat swells 02/06/21--TOLERATES CEFTRIAXONE     Nickel Rash   Pregabalin Other (See Comments)   Acetaminophen     Per MD patient states she can't take Tylenol because of liver enzymes   Atarax [Hydroxyzine] Itching and Swelling    Mildly swollen throat   Ativan [Lorazepam] Other (See Comments)    Migraines.   Oxycodone-Acetaminophen Nausea Only and Nausea And Vomiting   Strawberry Extract Swelling   Watermelon Concentrate Swelling   Albuterol Palpitations   Benadryl [Diphenhydramine Hcl] Palpitations   Humira [Adalimumab] Rash   Latex Rash   Remicade [Infliximab] Rash    Blisters and Welts    Tramadol Rash    Rash and itching    Patient Measurements: Height: 5\' 4"  (162.6 cm) Weight: 62.1 kg (136 lb 14.5 oz) IBW/kg (Calculated) : 54.7 Heparin Dosing Weight: 62 kg  Vital Signs: Temp: 97.8 F (36.6 C) (02/20 1300) Temp Source: Oral (02/20 1300) BP: 116/56 (02/20 1300) Pulse Rate: 102 (02/20 1300)  Labs: Recent Labs    06/21/23 0307 06/21/23 0804 06/22/23 0334 06/23/23 0310  HGB  --  9.8* 8.9*  --   HCT  --  29.4* 27.2*  --   PLT  --  608* 539*  --   CREATININE 1.30*  --  1.53* 0.99    Estimated Creatinine Clearance: 56.1 mL/min (by C-G formula based on SCr of 0.99 mg/dL).   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  Doppler this admit also shows chronic DVT.  Pharmacy consulted to transition from Eliquis to IV heparin since patient will need endoscopy.  Last Eliquis dose was on 2/20 at 0857, so will use aPTT to guide heparin dosing until it correlates with heparin levels.  CBC stable; no bleeding reported.  Goal of Therapy:  Heparin level 0.3-0.7 units/ml aPTT 66-102 seconds Monitor platelets by anticoagulation protocol: Yes   Plan:  At 2100, start IV heparin at 900 units/hr  Check daily aPTT, heparin level and CBC F/u with holding 6 hrs prior to endoscopy  Onica Davidovich D. Laney Potash, PharmD, BCPS, BCCCP 06/23/2023, 2:17 PM

## 2023-06-24 DIAGNOSIS — R571 Hypovolemic shock: Secondary | ICD-10-CM | POA: Diagnosis not present

## 2023-06-24 DIAGNOSIS — K9409 Other complications of colostomy: Secondary | ICD-10-CM | POA: Diagnosis not present

## 2023-06-24 DIAGNOSIS — R1310 Dysphagia, unspecified: Secondary | ICD-10-CM | POA: Diagnosis not present

## 2023-06-24 DIAGNOSIS — R109 Unspecified abdominal pain: Secondary | ICD-10-CM | POA: Diagnosis not present

## 2023-06-24 LAB — CBC
HCT: 28.4 % — ABNORMAL LOW (ref 36.0–46.0)
Hemoglobin: 9.1 g/dL — ABNORMAL LOW (ref 12.0–15.0)
MCH: 28 pg (ref 26.0–34.0)
MCHC: 32 g/dL (ref 30.0–36.0)
MCV: 87.4 fL (ref 80.0–100.0)
Platelets: 520 10*3/uL — ABNORMAL HIGH (ref 150–400)
RBC: 3.25 MIL/uL — ABNORMAL LOW (ref 3.87–5.11)
RDW: 17.2 % — ABNORMAL HIGH (ref 11.5–15.5)
WBC: 8.3 10*3/uL (ref 4.0–10.5)
nRBC: 0 % (ref 0.0–0.2)

## 2023-06-24 LAB — BASIC METABOLIC PANEL
Anion gap: 6 (ref 5–15)
BUN: 8 mg/dL (ref 6–20)
CO2: 23 mmol/L (ref 22–32)
Calcium: 8.9 mg/dL (ref 8.9–10.3)
Chloride: 108 mmol/L (ref 98–111)
Creatinine, Ser: 1.01 mg/dL — ABNORMAL HIGH (ref 0.44–1.00)
GFR, Estimated: >60 mL/min (ref >60)
Glucose, Bld: 83 mg/dL (ref 70–99)
Potassium: 3.7 mmol/L (ref 3.5–5.1)
Sodium: 137 mmol/L (ref 135–145)

## 2023-06-24 LAB — APTT
aPTT: 83 s — ABNORMAL HIGH (ref 24–36)
aPTT: 57 s — ABNORMAL HIGH (ref 24–36)

## 2023-06-24 LAB — CULTURE, BLOOD (ROUTINE X 2)
Culture: NO GROWTH
Culture: NO GROWTH

## 2023-06-24 LAB — HEPARIN LEVEL (UNFRACTIONATED)
Heparin Unfractionated: 0.43 [IU]/mL (ref 0.30–0.70)
Heparin Unfractionated: 0.29 [IU]/mL — ABNORMAL LOW (ref 0.30–0.70)

## 2023-06-24 MED ORDER — SODIUM CHLORIDE 0.9 % IV BOLUS
1000.0000 mL | Freq: Once | INTRAVENOUS | Status: AC
  Administered 2023-06-24: 1000 mL via INTRAVENOUS

## 2023-06-24 MED ORDER — MIDODRINE HCL 5 MG PO TABS
5.0000 mg | ORAL_TABLET | Freq: Three times a day (TID) | ORAL | Status: DC
  Administered 2023-06-24 – 2023-06-25 (×4): 5 mg via ORAL
  Filled 2023-06-24 (×5): qty 1

## 2023-06-24 NOTE — Discharge Instructions (Signed)

## 2023-06-24 NOTE — Progress Notes (Signed)
PROGRESS NOTE    [name]  ZOX:[id number] DOB: 1970-11-15 DOA: 06/19/2023 PCP: [name], PA-C   Brief Narrative: 53 year old past medical history significant for hypertension, hyperlipidemia, diabetes type 2, COPD, seizure disorder, lumbar endocarditis, anxiety, C. difficile colitis status post total colectomy with end ileostomy.  Patient presented secondary to altered mental status and found to have hypotension requiring vasopressor support in the ICU.  Managed for hypovolemic shock with improvement of blood pressure.   Assessment & Plan:   Principal Problem:   Sepsis (HCC) Active Problems:   Benzodiazepine (tranquilizer) overdose   Dysphagia   H/O ileostomy   1-Hypovolemic shock: -Patient presented with worsening hypotension on chronic hypotension.  Sepsis was ruled out Associated with hypothermia.  Patient was managed with Levophed on admission weaned off to 8/17. -Home midodrine restarted. -Continue with IV fluids.  -Continue with midodrine  2-AKI: Creatinine peak on admission 1.5 Related to hypovolemic shock Improved with IV fluids initially.  Cr increase to 1.5 (2/19). Resume IV fluids.  Improving. Continue with IV fluids  Dysphagia;  Report difficulty swallowing liquids and solid. Worse lately.  She had required esophageal dilation.  GI consulted, plan for endoscopy 2/22.  Abdominal pain Acute on chronic CT abdomen and pelvis: Changes consistent with prior colectomy and right side ileostomy.  No acute abnormality noted.  Acute toxic metabolic encephalopathy: Diazepam overdose -Presumed secondary to benzodiazepine and narcotics overdose. -Back to baseline Psych  consulted and recommendation for no inpatient admission  Right upper extremity DVT: Diagnosed October 2024 secondary to PICC with DVT located in the right internal jugular vein and right brachial veins. Eliquis held on admission. Right upper extremity venous Dopplers remains  significant for chronic DVT, will need to continue anticoagulation  Hold eliquis in anticipation endoscopy on 2/22. Start Heparin gtt.   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 History of Crohn's disease -Continue with IV fluids, Imodium  Normocytic  anemia: -Monitor Hb  Primary hypertension: Holding metoprolol due to hypotension and orthostasis  Paroxysmal SVT -Resume metoprolol when able  Orthostasis -Appears to be chronic problem, continue with midodrine, consent troponin stimulation test negative last December  Allergic Reaction:  Patient received atarax 2/19 , nurse notice facial and neck redness, throat tightness.  Atarax is on allergy med list. She received meds yesterday as well.  Received a dose of IV pepcid and IV solumedrol.  No further symptoms.  Atarax remove from orders.    Estimated body mass index is 22.74 kg/m as calculated from the following:   Height as of this encounter: 5\' 4"  (1.626 m).   Weight as of this encounter: 60.1 kg.   DVT prophylaxis: Eliquis Code Status: Full code Family Communication: Discussed with patient Disposition Plan:  Status is: Inpatient Remains inpatient appropriate because: management of AKI, Hypotension.     Consultants:  Psych Gastroenterology  Procedures:  none  Antimicrobials:    Subjective:  No significant events overnight, she denies complaints today  Objective: Vitals:   06/24/23 0400 06/24/23 0450 06/24/23 0457 06/24/23 1145  BP:    (!) 97/47  Pulse:      Resp: 16 16    Temp:      TempSrc:    Oral  SpO2:      Weight:   60.1 kg   Height:  Intake/Output Summary (Last 24 hours) at 06/24/2023 1253 Last data filed at 06/24/2023  0558 Gross per 24 hour  Intake 865.29 ml  Output 800 ml  Net 65.29 ml   Filed Weights   06/19/23 0205 06/23/23 0900 06/24/23 0457  Weight: 55 kg 62.1 kg 60.1 kg    Examination:   Awake Alert, Oriented X 3, No new F.N deficits, Normal affect Symmetrical Chest wall movement, Good air movement bilaterally, CTAB RRR,No Gallops,Rubs or new Murmurs, No Parasternal Heave +ve B.Sounds, Abd Soft, No tenderness, ileostomy present No Cyanosis, Clubbing or edema, No new Rash or bruise    Data Reviewed: I have personally reviewed following labs and imaging studies  CBC: Recent Labs  Lab 06/19/23 1201 06/20/23 0248 06/21/23 0804 06/22/23 0334 06/24/23 0451  WBC 10.9* 9.3 7.2 7.7 8.3  HGB 10.9* 8.5* 9.8* 8.9* 9.1*  HCT 33.1* 25.4* 29.4* 27.2* 28.4*  MCV 84.4 84.1 84.2 85.3 87.4  PLT 666* 540* 608* 539* 520*   Basic Metabolic Panel: Recent Labs  Lab 06/20/23 0248 06/21/23 0307 06/22/23 0334 06/23/23 0310 06/24/23 0749  NA 135 133* 137 136 137  K 3.5 4.0 4.1 5.0 3.7  CL 106 102 107 104 108  CO2 19* 22 19* 24 23  GLUCOSE 82 84 93 111* 83  BUN 6 8 7  5* 8  CREATININE 1.10* 1.30* 1.53* 0.99 1.01*  CALCIUM 8.0* 8.4* 8.8* 9.2 8.9  MG 0.9* 1.9  --   --   --   PHOS 3.9  --   --   --   --    GFR: Estimated Creatinine Clearance: 55 mL/min (A) (by C-G formula based on SCr of 1.01 mg/dL (H)). Liver Function Tests: Recent Labs  Lab 06/19/23 0238  AST 24  ALT 15  ALKPHOS 95  BILITOT 0.3  PROT 6.5  ALBUMIN 3.3*   Recent Labs  Lab 06/19/23 0238  LIPASE 32   No results for input(s): "AMMONIA" in the last 168 hours. Coagulation Profile: Recent Labs  Lab 06/19/23 0238  INR 1.0   Cardiac Enzymes: No results for input(s): "CKTOTAL", "CKMB", "CKMBINDEX", "TROPONINI" in the last 168 hours. BNP (last 3 results) No results for input(s): "PROBNP" in the last 8760 hours. HbA1C: No results for input(s): "HGBA1C" in the last 72 hours. CBG: Recent Labs  Lab 06/19/23 0652  06/19/23 0839 06/19/23 0925 06/19/23 1156 06/19/23 1530  GLUCAP 70 93 94 90 95   Lipid Profile: Recent Labs    06/22/23 0334  CHOL 145  HDL 53  LDLCALC 31  TRIG 306*  CHOLHDL 2.7   Thyroid Function Tests: No results for input(s): "TSH", "T4TOTAL", "FREET4", "T3FREE", "THYROIDAB" in the last 72 hours. Anemia Panel: Recent Labs    06/22/23 0811  VITAMINB12 196  FOLATE 8.2  FERRITIN 94  TIBC 358  IRON 83  RETICCTPCT 1.6   Sepsis Labs: Recent Labs  Lab 06/19/23 0238 06/19/23 0418 06/19/23 1201  PROCALCITON  --   --  <0.10  LATICACIDVEN 1.5 2.5* 1.1    Recent Results (from the past 240 hours)  Blood Culture (routine x 2)     Status: None   Collection Time: 06/19/23  2:38 AM   Specimen: BLOOD  Result Value Ref Range Status   Specimen Description BLOOD BLOOD LEFT ARM  Final   Special Requests NONE  Final   Culture   Final    NO GROWTH 5 DAYS Performed at [facility], [address] [place], [place] [address]  Report Status 06/24/2023 FINAL  Final  Blood Culture (routine x 2)     Status: None   Collection Time: 06/19/23  2:59 AM   Specimen: BLOOD RIGHT HAND  Result Value Ref Range Status   Specimen Description BLOOD RIGHT HAND  Final   Special Requests   Final    BOTTLES DRAWN AEROBIC ONLY Blood Culture results may not be optimal due to an inadequate volume of blood received in culture bottles   Culture   Final    NO GROWTH 5 DAYS Performed at [facility], [address], [place], [place] [address]    Report Status 06/24/2023 FINAL  Final  MRSA Next Gen by PCR, Nasal     Status: None   Collection Time: 06/19/23  9:42 AM   Specimen: Nasal Mucosa; Nasal Swab  Result Value Ref Range Status   MRSA by PCR Next Gen NOT DETECTED NOT DETECTED Final    Comment: (NOTE) The GeneXpert MRSA Assay (FDA approved for NASAL specimens only), is one component of a comprehensive MRSA colonization surveillance program. It is not intended to diagnose MRSA infection nor to  guide or monitor treatment for MRSA infections. Test performance is not FDA approved in patients less than 67 years old. Performed at [facility], [address], [place], [place] [address]          Radiology Studies: DG ESOPHAGUS W SINGLE CM (SOL OR THIN BA) Result Date: 06/23/2023 CLINICAL DATA:  Dysphagia with globus sensation particularly to bread and tablets EXAM: ESOPHAGUS/BARIUM SWALLOW/TABLET STUDY TECHNIQUE: Single contrast examination was performed using thin liquid barium. This exam was performed by Corrin Parker, PA-C, and was supervised and interpreted by Dr. Malachy Moan. FLUOROSCOPY: Radiation Exposure Index (as provided by the fluoroscopic device): 6.7 mGy Kerma COMPARISON:  CT scan of the abdomen and pelvis done June 18, 2008 FINDINGS: Swallowing: Appears normal. No vestibular penetration or aspiration seen. Pharynx: Unremarkable. Esophagus: No mucosal lesions, stenosis or stricture. Esophageal motility: Tertiary contraction seen in the distal esophagus Hiatal Hernia: None. Gastroesophageal reflux: No spontaneous reflux seen and no reflux with cough or Valsalva Ingested 13 mm barium tablet: Became lodged just proximal to the gastroesophageal junction. The pill did not pass with additional sips of water or thin barium. Patient was given a bite of Cheree Ditto cracker which allowed the tablet to pass. Other: Exam technically limited secondary to patient's immobility IMPRESSION: 1. No anatomic or structural soft tissue abnormality. 2. Mild tertiary contractions noted in the distal esophagus which could represent early nonspecific esophageal motility disorder. 3. 13 mm barium tablet did become lodged just proximal to the gastroesophageal junction but ultimately passed after the patient consumed a bit of Graham cracker and additional sips of water. Electronically Signed   By: Malachy Moan M.D.   On: 06/23/2023 15:41        Scheduled Meds:  amitriptyline  100 mg Oral QHS    Chlorhexidine Gluconate Cloth  6 each Topical Daily   famotidine  20 mg Oral BID   folic acid  1 mg Oral Daily   gabapentin  300 mg Oral BID   Gerhardt's butt cream   Topical BID   levETIRAcetam  750 mg Oral BID   loperamide  2 mg Oral 4 times per day   metoprolol tartrate  12.5 mg Oral BID   midodrine  5 mg Oral TID WC   thiamine  100 mg Oral Daily   Continuous Infusions:  heparin 900 Units/hr (06/24/23 0558)  lactated ringers Stopped (06/23/23 1138)     LOS: 5 days      Huey Bienenstock, MD [facility]   If 7PM-7AM, please contact night-coverage [email or web address]  06/24/2023, 12:53 PM

## 2023-06-24 NOTE — H&P (View-Only) (Signed)
 Gastroenterology Inpatient Follow Up    Subjective: Patient's ostomy output does appear to be decreasing.  Her stool is becoming more solid.  Objective: Vital signs in last 24 hours: Temp:  [97.9 F (36.6 C)-98.3 F (36.8 C)] 97.9 F (36.6 C) (02/21 0300) Pulse Rate:  [86-110] 86 (02/21 0000) Resp:  [16-22] 16 (02/21 0450) BP: (76-112)/(37-57) 97/47 (02/21 1145) SpO2:  [95 %-100 %] 95 % (02/21 0000) Weight:  [60.1 kg] 60.1 kg (02/21 0457) Last BM Date : 06/24/23  Intake/Output from previous day: 02/20 0701 - 02/21 0700 In: 1661.7 [P.O.:1120; I.V.:491.6; IV Piggyback:50.1] Out: 1300 [Urine:500; Stool:800] Intake/Output this shift: No intake/output data recorded.  General appearance: alert and cooperative Resp: No increased work of breathing Cardio: Regular rate GI: Nontender, nondistended  Lab Results: Recent Labs    06/22/23 0334 06/24/23 0451  WBC 7.7 8.3  HGB 8.9* 9.1*  HCT 27.2* 28.4*  PLT 539* 520*   BMET Recent Labs    06/22/23 0334 06/23/23 0310 06/24/23 0749  NA 137 136 137  K 4.1 5.0 3.7  CL 107 104 108  CO2 19* 24 23  GLUCOSE 93 111* 83  BUN 7 5* 8  CREATININE 1.53* 0.99 1.01*  CALCIUM 8.8* 9.2 8.9   LFT No results for input(s): "PROT", "ALBUMIN", "AST", "ALT", "ALKPHOS", "BILITOT", "BILIDIR", "IBILI" in the last 72 hours. PT/INR No results for input(s): "LABPROT", "INR" in the last 72 hours. Hepatitis Panel No results for input(s): "HEPBSAG", "HCVAB", "HEPAIGM", "HEPBIGM" in the last 72 hours. C-Diff No results for input(s): "CDIFFTOX" in the last 72 hours.  Studies/Results: DG ESOPHAGUS W SINGLE CM (SOL OR THIN BA) Result Date: 06/23/2023 CLINICAL DATA:  Dysphagia with globus sensation particularly to bread and tablets EXAM: ESOPHAGUS/BARIUM SWALLOW/TABLET STUDY TECHNIQUE: Single contrast examination was performed using thin liquid barium. This exam was performed by Corrin Parker, PA-C, and was supervised and interpreted by Dr. Malachy Moan. FLUOROSCOPY: Radiation Exposure Index (as provided by the fluoroscopic device): 6.7 mGy Kerma COMPARISON:  CT scan of the abdomen and pelvis done June 18, 2008 FINDINGS: Swallowing: Appears normal. No vestibular penetration or aspiration seen. Pharynx: Unremarkable. Esophagus: No mucosal lesions, stenosis or stricture. Esophageal motility: Tertiary contraction seen in the distal esophagus Hiatal Hernia: None. Gastroesophageal reflux: No spontaneous reflux seen and no reflux with cough or Valsalva Ingested 13 mm barium tablet: Became lodged just proximal to the gastroesophageal junction. The pill did not pass with additional sips of water or thin barium. Patient was given a bite of Cheree Ditto cracker which allowed the tablet to pass. Other: Exam technically limited secondary to patient's immobility IMPRESSION: 1. No anatomic or structural soft tissue abnormality. 2. Mild tertiary contractions noted in the distal esophagus which could represent early nonspecific esophageal motility disorder. 3. 13 mm barium tablet did become lodged just proximal to the gastroesophageal junction but ultimately passed after the patient consumed a bit of Graham cracker and additional sips of water. Electronically Signed   By: Malachy Moan M.D.   On: 06/23/2023 15:41    Medications: I have reviewed the patient's current medications. Scheduled:  amitriptyline  100 mg Oral QHS   Chlorhexidine Gluconate Cloth  6 each Topical Daily   famotidine  20 mg Oral BID   folic acid  1 mg Oral Daily   gabapentin  300 mg Oral BID   Gerhardt's butt cream   Topical BID   levETIRAcetam  750 mg Oral BID   loperamide  2 mg Oral 4 times  per day   metoprolol tartrate  12.5 mg Oral BID   midodrine  5 mg Oral TID WC   thiamine  100 mg Oral Daily   Continuous:  heparin 1,000 Units/hr (06/24/23 1457)   lactated ringers 100 mL/hr at 06/24/23 1459   EGB:[id number], ALPRAZolam, cyclobenzaprine, docusate sodium, HYDROmorphone,  ibuprofen, naphazoline-pheniramine, ondansetron (ZOFRAN) IV **OR** ondansetron, polyethylene glycol  Assessment/Plan: 53 year old female with history of asthma, DM, COPD, seizure disorder, DVT, GERD, inflammatory/stricturing colonic Crohn's disease, 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 Dysphagia is mostly to solids and food feels like it gets stuck in the upper chest. Her last EGD was in 10/2014 with a small hiatal hernia and dilation with a 56 [place] Maloney. Barium swallow was performed and did show that the barium tablet got lodged just proximal to the GE junction. Thus patient may benefit from esophageal dilation during her upcoming EGD.  Will plan for EGD tomorrow.  Patient's ostomy output appears to be solidifying with some Imodium therapy.  Patient continues to describe some issues with occasional abdominal pain.  I do wonder if her abdominal pain may be related to adhesions from her prior surgery.  Patient previously did not have any benefit in her pain from butyrate or mesalamine enemas.  -Continue to monitor ostomy output -Continue loperamide to help with ostomy output -Plan for EGD tomorrow.  Put in a pharmacy consult to hold heparin drip 6 hours before her procedure.  Last dose of Eliquis was yesterday morning.   LOS: 5 days   Imogene Burn 06/24/2023, 4:10 PM

## 2023-06-24 NOTE — Progress Notes (Signed)
PHARMACY - ANTICOAGULATION CONSULT NOTE  Pharmacy Consult:  Heparin Indication: Chronic DVT  Allergies  Allergen Reactions   Codeine Shortness Of Breath, Swelling and Rash    Throat swelling   Hydrocodone Shortness Of Breath, Swelling and Rash   Ketorolac Nausea And Vomiting, Swelling and Nausea Only    Pt reports n/v and swelling to throat   Morphine And Codeine Shortness Of Breath, Swelling and Rash   Penicillins Shortness Of Breath, Swelling and Other (See Comments)    Throat swells 02/06/21--TOLERATES CEFTRIAXONE     Nickel Rash   Pregabalin Other (See Comments)   Acetaminophen     Per MD patient states she can't take Tylenol because of liver enzymes   Atarax [Hydroxyzine] Itching and Swelling    Mildly swollen throat   Ativan [Lorazepam] Other (See Comments)    Migraines.   Oxycodone-Acetaminophen Nausea Only and Nausea And Vomiting   Strawberry Extract Swelling   Watermelon Concentrate Swelling   Albuterol Palpitations   Benadryl [Diphenhydramine Hcl] Palpitations   Humira [Adalimumab] Rash   Latex Rash   Remicade [Infliximab] Rash    Blisters and Welts    Tramadol Rash    Rash and itching    Patient Measurements: Height: 5\' 4"  (162.6 cm) Weight: 60.1 kg (132 lb 7.9 oz) IBW/kg (Calculated) : 54.7 Heparin Dosing Weight: 62 kg  Vital Signs: Temp: 97.9 F (36.6 C) (02/21 0300) Temp Source: Oral (02/21 0300) BP: 80/42 (02/21 0100) Pulse Rate: 86 (02/21 0000)  Labs: Recent Labs    06/21/23 0804 06/22/23 0334 06/23/23 0310 06/24/23 0451  HGB 9.8* 8.9*  --  9.1*  HCT 29.4* 27.2*  --  28.4*  PLT 608* 539*  --  520*  APTT  --   --   --  83*  HEPARINUNFRC  --   --   --  0.43  CREATININE  --  1.53* 0.99  --     Estimated Creatinine Clearance: 56.1 mL/min (by C-G formula based on SCr of 0.99 mg/dL).   Medical History: Past Medical History:  Diagnosis Date   Anxiety    Asthma    Chronic low back pain    Collagen vascular disease (HCC)    COPD  (chronic obstructive pulmonary disease) (HCC)    Crohn's disease (HCC)    Fungal endocarditis 03/28/2023   GERD (gastroesophageal reflux disease)    EGD 01/2007 by Dr.Rourke small hiatal hernia s/p 56 french maloney    History of head injury    Hypertension    IBS (irritable bowel syndrome)    PSVT (paroxysmal supraventricular tachycardia) (HCC)    PTSD (post-traumatic stress disorder)    Recurrent chest pain    Seizure disorder (HCC)    Type 2 diabetes mellitus (HCC)       Assessment: 62 YOF with history of PICC-associated RUE DVT on Eliquis PTA.  Doppler this admit also shows chronic DVT.  Pharmacy consulted to transition from Eliquis to IV heparin since patient will need endoscopy.  Last Eliquis dose was on 2/20 at 0857, so will use aPTT to guide heparin dosing until it correlates with heparin levels.  CBC stable; no bleeding reported.  2/21 AM update:  Heparin level therapeutic aPTT/HL correlating-will DC aPTT's  Goal of Therapy:  Heparin level 0.3-0.7 units/ml aPTT 66-102 seconds Monitor platelets by anticoagulation protocol: Yes   Plan:  Cont heparin 900 units/hr 1200 heparin level F/u with holding 6 hrs prior to endoscopy  Abran Duke, PharmD, BCPS Clinical Pharmacist Phone: [phone]

## 2023-06-24 NOTE — Anesthesia Preprocedure Evaluation (Addendum)
 Anesthesia Evaluation  Patient identified by MRN, date of birth, ID band Patient awake    Reviewed: Allergy & Precautions, H&P , NPO status , Patient's Chart, lab work & pertinent test results, reviewed documented beta blocker date and time   Airway Mallampati: IV  TM Distance: >3 FB Neck ROM: Full    Dental no notable dental hx. (+) Dental Advisory Given, Teeth Intact   Pulmonary asthma , COPD,  COPD inhaler, Recent URI , Resolved, Current Smoker and Patient abstained from smoking.   Pulmonary exam normal breath sounds clear to auscultation       Cardiovascular hypertension, Pt. on medications and Pt. on home beta blockers + DOE  Normal cardiovascular exam+ dysrhythmias  Rhythm:Regular Rate:Normal  Echo 02/2023  1. Negative TEE for endocarditis.   2. Left ventricular ejection fraction, by estimation, is 60 to 65%. The left ventricle has normal function.   3. Right ventricular systolic function is normal. The right ventricular size is normal.   4. No left atrial/left atrial appendage thrombus was detected.   5. Moderate pericardial effusion. The pericardial effusion is anterior to the right ventricle.   6. The mitral valve is grossly normal. Trivial mitral valve regurgitation. No evidence of mitral stenosis.   7. The aortic valve is tricuspid. Aortic valve regurgitation is not visualized. No aortic stenosis is present.   8. Agitated saline contrast bubble study was negative, with no evidence of any interatrial shunt.   Conclusion(s)/Recommendation(s): No evidence of vegetation/infective endocarditis on this transesophageael echocardiogram.    Echo 02/07/2023  1. Left ventricular ejection fraction, by estimation, is >75%. The left ventricle has hyperdynamic function. The left ventricle has no regional wall motion abnormalities. There is mild concentric left ventricular hypertrophy. Left ventricular diastolic parameters are  indeterminate.   2. Right ventricular systolic function is normal. The right ventricular size is normal.   3. Pericardial effusion measuring up to 0.9 cm. Moderate pericardial effusion. There is no evidence of cardiac tamponade.   4. The mitral valve is normal in structure. No evidence of mitral valve regurgitation. No evidence of mitral stenosis.   5. The aortic valve is normal in structure. Aortic valve regurgitation is not visualized. No aortic stenosis is present.   6. The inferior vena cava is normal in size with greater than 50% respiratory variability, suggesting right atrial pressure of 3 mmHg.     Neuro/Psych Seizures -, Well Controlled,  PSYCHIATRIC DISORDERS Anxiety Depression       GI/Hepatic Neg liver ROS,GERD  Medicated and Controlled,,  Endo/Other  diabetes, Insulin Dependent    Renal/GU Renal disease     Musculoskeletal  (+) Arthritis , Osteoarthritis,    Abdominal   Peds  Hematology  (+) Blood dyscrasia, anemia   Anesthesia Other Findings   Reproductive/Obstetrics negative OB ROS                              Anesthesia Physical Anesthesia Quick Evaluation  Anesthesia Plan  ASA: 4  Anesthesia Plan: MAC   Post-op Pain Management: Minimal or no pain anticipated   Induction:   PONV Risk Score and Plan: 3 and TIVA, Propofol infusion and Treatment may vary due to age or medical condition  Airway Management Planned:   Additional Equipment:   Intra-op Plan:   Post-operative Plan:   Informed Consent: I have reviewed the patients History and Physical, chart, labs and discussed the procedure including the risks, benefits and alternatives for  the proposed anesthesia with the patient or authorized representative who has indicated his/her understanding and acceptance.     Dental advisory given  Plan Discussed with: CRNA  Anesthesia Plan Comments:

## 2023-06-24 NOTE — Plan of Care (Signed)
Pt has rested quietly throughout the night with no distress noted. Alert and oriented. On room air. SR on the monitor. Up to [facility] to void. Colostomy intact to BSD. Medicated 3 times for pain with relief noted. Pt BP decreases when she is sound asleep. Received 1 liter bolus during night. When awake BP good. Medicated for anxiety and nausea also with relief noted. No other complaints voiced.     Problem: Education: Goal: Knowledge of General Education information will improve Description: Including pain rating scale, medication(s)/side effects and non-pharmacologic comfort measures Outcome: Progressing   Problem: Clinical Measurements: Goal: Ability to maintain clinical measurements within normal limits will improve Outcome: Progressing   Problem: Activity: Goal: Risk for activity intolerance will decrease Outcome: Progressing   Problem: Coping: Goal: Level of anxiety will decrease Outcome: Progressing   Problem: Elimination: Goal: Will not experience complications related to bowel motility Outcome: Progressing Goal: Will not experience complications related to urinary retention Outcome: Progressing   Problem: Pain Managment: Goal: General experience of comfort will improve and/or be controlled Outcome: Progressing

## 2023-06-24 NOTE — Progress Notes (Signed)
PHARMACY - ANTICOAGULATION CONSULT NOTE  Pharmacy Consult for Heparin (Eliquis on hold) Indication:  chronic DVT  Patient Measurements: Height: 5\' 4"  (162.6 cm) Weight: 60.1 kg (132 lb 7.9 oz) IBW/kg (Calculated) : 54.7 Heparin Dosing Weight: 60 kg  Vital Signs: Temp: 97.9 F (36.6 C) (02/21 0300) Temp Source: Oral (02/21 1145) BP: 97/47 (02/21 1145)  Labs: Recent Labs    06/22/23 0334 06/23/23 0310 06/24/23 0451 06/24/23 0749 06/24/23 1137  HGB 8.9*  --  9.1*  --   --   HCT 27.2*  --  28.4*  --   --   PLT 539*  --  520*  --   --   APTT  --   --  83*  --  57*  HEPARINUNFRC  --   --  0.43  --  0.29*  CREATININE 1.53* 0.99  --  1.01*  --     Estimated Creatinine Clearance: 55 mL/min (A) (by C-G formula based on SCr of 1.01 mg/dL (H)).  Assessment: 89 YOF with history of PICC-associated RUE DVT on Eliquis PTA.  Doppler this admit also shows chronic DVT.  Pharmacy consulted to transition from Eliquis to IV heparin since patient will need endoscopy.  Last Eliquis dose was on 2/20 at 0857, so will use aPTT to guide heparin dosing until it correlates with heparin levels.  CBC stable; no bleeding  reported.  Initial heparin level therapeutic (0.43) and aPTT also therapeutic (83 seconds) on 900 units/hr.  Follow level just below goal (0.29) and aPTT also below goal (57 seconds). Had expected heparin levels to be falsely high due to recent Eliquis doses.  EGD scheduled for 0730 on 06/25/23.  Heparin to stop 6 hours prior to procedure.   Goal of Therapy:  Heparin level 0.3-0.7 units/ml aPTT 66-102 seconds Monitor platelets by anticoagulation protocol: Yes   Plan:  Increase heparin drip to 1000 units/hr. Heparin drip to stop at 0130 on 2/22 for EGD at 0730. Will defer follow up heparin level late tonight. Eliquis on hold. Will follow up post-EGD on 2/22 for anticoagulation plans.  Dennie Fetters, RPh 06/24/2023,2:45 PM

## 2023-06-24 NOTE — Progress Notes (Signed)
Mobility Specialist Progress Note:    06/24/23 1147  Mobility  Activity Ambulated with assistance in hallway  Level of Assistance Contact guard assist, steadying assist  Assistive Device None  Distance Ambulated (ft) 500 ft  Activity Response Tolerated well  Mobility Referral Yes  Mobility visit 1 Mobility  Mobility Specialist Start Time (ACUTE ONLY) 1021  Mobility Specialist Stop Time (ACUTE ONLY) 1031  Mobility Specialist Time Calculation (min) (ACUTE ONLY) 10 min   Received pt in chair having no complaints and agreeable to mobility. Pt was asymptomatic throughout ambulation. Pt HR stayed between 110-125 during ambulation no c/o. Returned to room w/o fault. HR returned to 103 at EOS. Left in chair w/ call bell in reach and all needs met.     Thompson Grayer Mobility Specialist  Please contact vis Secure Chat or  Rehab Office [phone]

## 2023-06-24 NOTE — Progress Notes (Signed)
Gastroenterology Inpatient Follow Up    Subjective: Patient's ostomy output does appear to be decreasing.  Her stool is becoming more solid.  Objective: Vital signs in last 24 hours: Temp:  [97.9 F (36.6 C)-98.3 F (36.8 C)] 97.9 F (36.6 C) (02/21 0300) Pulse Rate:  [86-110] 86 (02/21 0000) Resp:  [16-22] 16 (02/21 0450) BP: (76-112)/(37-57) 97/47 (02/21 1145) SpO2:  [95 %-100 %] 95 % (02/21 0000) Weight:  [60.1 kg] 60.1 kg (02/21 0457) Last BM Date : 06/24/23  Intake/Output from previous day: 02/20 0701 - 02/21 0700 In: 1661.7 [P.O.:1120; I.V.:491.6; IV Piggyback:50.1] Out: 1300 [Urine:500; Stool:800] Intake/Output this shift: No intake/output data recorded.  General appearance: alert and cooperative Resp: No increased work of breathing Cardio: Regular rate GI: Nontender, nondistended  Lab Results: Recent Labs    06/22/23 0334 06/24/23 0451  WBC 7.7 8.3  HGB 8.9* 9.1*  HCT 27.2* 28.4*  PLT 539* 520*   BMET Recent Labs    06/22/23 0334 06/23/23 0310 06/24/23 0749  NA 137 136 137  K 4.1 5.0 3.7  CL 107 104 108  CO2 19* 24 23  GLUCOSE 93 111* 83  BUN 7 5* 8  CREATININE 1.53* 0.99 1.01*  CALCIUM 8.8* 9.2 8.9   LFT No results for input(s): "PROT", "ALBUMIN", "AST", "ALT", "ALKPHOS", "BILITOT", "BILIDIR", "IBILI" in the last 72 hours. PT/INR No results for input(s): "LABPROT", "INR" in the last 72 hours. Hepatitis Panel No results for input(s): "HEPBSAG", "HCVAB", "HEPAIGM", "HEPBIGM" in the last 72 hours. C-Diff No results for input(s): "CDIFFTOX" in the last 72 hours.  Studies/Results: DG ESOPHAGUS W SINGLE CM (SOL OR THIN BA) Result Date: 06/23/2023 CLINICAL DATA:  Dysphagia with globus sensation particularly to bread and tablets EXAM: ESOPHAGUS/BARIUM SWALLOW/TABLET STUDY TECHNIQUE: Single contrast examination was performed using thin liquid barium. This exam was performed by Corrin Parker, PA-C, and was supervised and interpreted by Dr. Malachy Moan. FLUOROSCOPY: Radiation Exposure Index (as provided by the fluoroscopic device): 6.7 mGy Kerma COMPARISON:  CT scan of the abdomen and pelvis done June 18, 2008 FINDINGS: Swallowing: Appears normal. No vestibular penetration or aspiration seen. Pharynx: Unremarkable. Esophagus: No mucosal lesions, stenosis or stricture. Esophageal motility: Tertiary contraction seen in the distal esophagus Hiatal Hernia: None. Gastroesophageal reflux: No spontaneous reflux seen and no reflux with cough or Valsalva Ingested 13 mm barium tablet: Became lodged just proximal to the gastroesophageal junction. The pill did not pass with additional sips of water or thin barium. Patient was given a bite of Cheree Ditto cracker which allowed the tablet to pass. Other: Exam technically limited secondary to patient's immobility IMPRESSION: 1. No anatomic or structural soft tissue abnormality. 2. Mild tertiary contractions noted in the distal esophagus which could represent early nonspecific esophageal motility disorder. 3. 13 mm barium tablet did become lodged just proximal to the gastroesophageal junction but ultimately passed after the patient consumed a bit of Graham cracker and additional sips of water. Electronically Signed   By: Malachy Moan M.D.   On: 06/23/2023 15:41    Medications: I have reviewed the patient's current medications. Scheduled:  amitriptyline  100 mg Oral QHS   Chlorhexidine Gluconate Cloth  6 each Topical Daily   famotidine  20 mg Oral BID   folic acid  1 mg Oral Daily   gabapentin  300 mg Oral BID   Gerhardt's butt cream   Topical BID   levETIRAcetam  750 mg Oral BID   loperamide  2 mg Oral 4 times  per day   metoprolol tartrate  12.5 mg Oral BID   midodrine  5 mg Oral TID WC   thiamine  100 mg Oral Daily   Continuous:  heparin 1,000 Units/hr (06/24/23 1457)   lactated ringers 100 mL/hr at 06/24/23 1459   EGB:[id number], ALPRAZolam, cyclobenzaprine, docusate sodium, HYDROmorphone,  ibuprofen, naphazoline-pheniramine, ondansetron (ZOFRAN) IV **OR** ondansetron, polyethylene glycol  Assessment/Plan: 53 year old female with history of asthma, DM, COPD, seizure disorder, DVT, GERD, inflammatory/stricturing colonic Crohn's disease, 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 Dysphagia is mostly to solids and food feels like it gets stuck in the upper chest. Her last EGD was in 10/2014 with a small hiatal hernia and dilation with a 56 [place] Maloney. Barium swallow was performed and did show that the barium tablet got lodged just proximal to the GE junction. Thus patient may benefit from esophageal dilation during her upcoming EGD.  Will plan for EGD tomorrow.  Patient's ostomy output appears to be solidifying with some Imodium therapy.  Patient continues to describe some issues with occasional abdominal pain.  I do wonder if her abdominal pain may be related to adhesions from her prior surgery.  Patient previously did not have any benefit in her pain from butyrate or mesalamine enemas.  -Continue to monitor ostomy output -Continue loperamide to help with ostomy output -Plan for EGD tomorrow.  Put in a pharmacy consult to hold heparin drip 6 hours before her procedure.  Last dose of Eliquis was yesterday morning.   LOS: 5 days   Imogene Burn 06/24/2023, 4:10 PM

## 2023-06-25 ENCOUNTER — Encounter (HOSPITAL_COMMUNITY): Payer: Self-pay | Admitting: Pulmonary Disease

## 2023-06-25 ENCOUNTER — Inpatient Hospital Stay (HOSPITAL_COMMUNITY): Payer: Medicaid Other | Admitting: Anesthesiology

## 2023-06-25 ENCOUNTER — Encounter (HOSPITAL_COMMUNITY)
Admission: EM | Disposition: A | Discharge status: Home Health | Payer: Self-pay | Source: Home / Self Care | Attending: Internal Medicine

## 2023-06-25 DIAGNOSIS — J449 Chronic obstructive pulmonary disease, unspecified: Secondary | ICD-10-CM

## 2023-06-25 DIAGNOSIS — K2970 Gastritis, unspecified, without bleeding: Secondary | ICD-10-CM

## 2023-06-25 DIAGNOSIS — K219 Gastro-esophageal reflux disease without esophagitis: Secondary | ICD-10-CM

## 2023-06-25 DIAGNOSIS — R1310 Dysphagia, unspecified: Secondary | ICD-10-CM

## 2023-06-25 DIAGNOSIS — K3189 Other diseases of stomach and duodenum: Secondary | ICD-10-CM

## 2023-06-25 DIAGNOSIS — I10 Essential (primary) hypertension: Secondary | ICD-10-CM

## 2023-06-25 DIAGNOSIS — K264 Chronic or unspecified duodenal ulcer with hemorrhage: Secondary | ICD-10-CM | POA: Diagnosis not present

## 2023-06-25 DIAGNOSIS — K2971 Gastritis, unspecified, with bleeding: Secondary | ICD-10-CM

## 2023-06-25 HISTORY — PX: ESOPHAGOGASTRODUODENOSCOPY (EGD) WITH PROPOFOL: SHX5813

## 2023-06-25 HISTORY — PX: BIOPSY: SHX5522

## 2023-06-25 HISTORY — PX: SAVORY DILATION: SHX5439

## 2023-06-25 LAB — CBC
HCT: 26.3 % — ABNORMAL LOW (ref 36.0–46.0)
Hemoglobin: 8.4 g/dL — ABNORMAL LOW (ref 12.0–15.0)
MCH: 28 pg (ref 26.0–34.0)
MCHC: 31.9 g/dL (ref 30.0–36.0)
MCV: 87.7 fL (ref 80.0–100.0)
Platelets: 491 10*3/uL — ABNORMAL HIGH (ref 150–400)
RBC: 3 MIL/uL — ABNORMAL LOW (ref 3.87–5.11)
RDW: 17.7 % — ABNORMAL HIGH (ref 11.5–15.5)
WBC: 7.4 10*3/uL (ref 4.0–10.5)
nRBC: 0 % (ref 0.0–0.2)

## 2023-06-25 LAB — BASIC METABOLIC PANEL
Anion gap: 8 (ref 5–15)
BUN: 8 mg/dL (ref 6–20)
CO2: 25 mmol/L (ref 22–32)
Calcium: 8.6 mg/dL — ABNORMAL LOW (ref 8.9–10.3)
Chloride: 106 mmol/L (ref 98–111)
Creatinine, Ser: 1.11 mg/dL — ABNORMAL HIGH (ref 0.44–1.00)
GFR, Estimated: 59 mL/min — ABNORMAL LOW (ref >60)
Glucose, Bld: 79 mg/dL (ref 70–99)
Potassium: 4.1 mmol/L (ref 3.5–5.1)
Sodium: 139 mmol/L (ref 135–145)

## 2023-06-25 LAB — GLUCOSE, CAPILLARY: Glucose-Capillary: 78 mg/dL (ref 70–99)

## 2023-06-25 LAB — HEPARIN LEVEL (UNFRACTIONATED): Heparin Unfractionated: <0.1 [IU]/mL — ABNORMAL LOW (ref 0.30–0.70)

## 2023-06-25 SURGERY — ESOPHAGOGASTRODUODENOSCOPY (EGD) WITH PROPOFOL
Anesthesia: Monitor Anesthesia Care

## 2023-06-25 MED ORDER — LIDOCAINE 2% (20 MG/ML) 5 ML SYRINGE
INTRAMUSCULAR | Status: DC | PRN
  Administered 2023-06-25: 40 mg via INTRAVENOUS

## 2023-06-25 MED ORDER — PROPOFOL 10 MG/ML IV BOLUS
INTRAVENOUS | Status: DC | PRN
  Administered 2023-06-25 (×2): 20 mg via INTRAVENOUS

## 2023-06-25 MED ORDER — PHENYLEPHRINE 80 MCG/ML (10ML) SYRINGE FOR IV PUSH (FOR BLOOD PRESSURE SUPPORT)
PREFILLED_SYRINGE | INTRAVENOUS | Status: DC | PRN
  Administered 2023-06-25: 160 ug via INTRAVENOUS

## 2023-06-25 MED ORDER — LACTATED RINGERS IV SOLN: INTRAVENOUS | Status: DC | PRN

## 2023-06-25 MED ORDER — ZOLPIDEM TARTRATE 5 MG PO TABS
2.5000 mg | ORAL_TABLET | Freq: Once | ORAL | Status: AC
  Administered 2023-06-25: 2.5 mg via ORAL
  Filled 2023-06-25: qty 1

## 2023-06-25 MED ORDER — PANTOPRAZOLE SODIUM 40 MG PO TBEC
40.0000 mg | DELAYED_RELEASE_TABLET | Freq: Two times a day (BID) | ORAL | Status: DC
  Administered 2023-06-25 – 2023-06-27 (×6): 40 mg via ORAL
  Filled 2023-06-25 (×7): qty 1

## 2023-06-25 MED ORDER — PROPOFOL 500 MG/50ML IV EMUL
INTRAVENOUS | Status: DC | PRN
  Administered 2023-06-25: 140 ug/kg/min via INTRAVENOUS

## 2023-06-25 SURGICAL SUPPLY — 14 items

## 2023-06-25 NOTE — Plan of Care (Signed)
  Problem: Education: Goal: Knowledge of General Education information will improve Description: Including pain rating scale, medication(s)/side effects and non-pharmacologic comfort measures Outcome: Progressing   Problem: Clinical Measurements: Goal: Ability to maintain clinical measurements within normal limits will improve Outcome: Progressing Goal: Will remain free from infection Outcome: Progressing   Problem: Activity: Goal: Risk for activity intolerance will decrease Outcome: Progressing   Problem: Coping: Goal: Level of anxiety will decrease Outcome: Progressing   Problem: Pain Managment: Goal: General experience of comfort will improve and/or be controlled Outcome: Progressing   Problem: Skin Integrity: Goal: Risk for impaired skin integrity will decrease Outcome: Progressing

## 2023-06-25 NOTE — Anesthesia Postprocedure Evaluation (Signed)
 Anesthesia Post Note  Patient: [name]  Procedure(s) Performed: ESOPHAGOGASTRODUODENOSCOPY (EGD) WITH PROPOFOL SAVORY DILATION BIOPSY     Patient location during evaluation: PACU Anesthesia Type: MAC Level of consciousness: awake and alert Pain management: pain level controlled Vital Signs Assessment: post-procedure vital signs reviewed and stable Respiratory status: spontaneous breathing Cardiovascular status: stable Anesthetic complications: no   No notable events documented.  Last Vitals:  Vitals:   06/25/23 1037 06/25/23 1055  BP:    Pulse:  87  Resp: 18 14  Temp:    SpO2:  100%    Last Pain:  Vitals:   06/25/23 1055  TempSrc:   PainSc: 9                  Abagale Boulos

## 2023-06-25 NOTE — Op Note (Signed)
 [facility] Patient Name: [name] Procedure Date : 06/25/2023 MRN: [id number] Attending MD: Particia Lather , , [id number] Date of Birth: [birth year] CSN: [id number] Age: 53 Admit Type: Inpatient Procedure:                Upper GI endoscopy Indications:              Dysphagia Providers:                Madelyn Brunner" Francine Graven, RN, Alan Ripper,                            Technician Referring MD:             Hospitalist team Medicines:                Monitored Anesthesia Care Complications:            No immediate complications. Estimated Blood Loss:     Estimated blood loss was minimal. Procedure:                Pre-Anesthesia Assessment:                           - Prior to the procedure, a History and Physical                            was performed, and patient medications and                            allergies were reviewed. The patient's tolerance of                            previous anesthesia was also reviewed. The risks                            and benefits of the procedure and the sedation                            options and risks were discussed with the patient.                            All questions were answered, and informed consent                            was obtained. Prior Anticoagulants: The patient has                            taken heparin, last dose was day of procedure. ASA                            Grade Assessment: III - A patient with severe                            systemic disease. After reviewing the risks and  benefits, the patient was deemed in satisfactory                            condition to undergo the procedure.                           After obtaining informed consent, the endoscope was                            passed under direct vision. Throughout the                            procedure, the patient's blood pressure, pulse, and                            oxygen  saturations were monitored continuously. The                            [id number]-[id number] ([id number]) Olympus endoscope was introduced                            through the mouth, and advanced to the second part                            of duodenum. The upper GI endoscopy was                            accomplished without difficulty. The patient                            tolerated the procedure well. Scope In: Scope Out: Findings:      The examined esophagus was normal. A guidewire was placed and the scope       was withdrawn. Dilation was performed with a Savary dilator with mild       resistance at 18 mm. Biopsies were taken with a cold forceps for       histology.      Localized inflammation with hemorrhage characterized by congestion       (edema), erosions and erythema was found in the gastric antrum. Biopsies       were taken with a cold forceps for histology.      A few localized erosions with stigmata of recent bleeding were found in       the duodenal bulb. Impression:               - Normal esophagus. Dilated. Biopsied.                           - Gastritis. Biopsied.                           - Duodenal erosions with stigmata of recent                            bleeding. Recommendation:           - Return patient to hospital ward for  ongoing care.                           - Await pathology results.                           - Okay to restart patient's home Eliquis tomorrow.                           - Recommend PPI BID for 8 weeks, then QD                           - Could consider esophageal manometry in the future                            if dysphagia persists.                           - Patient can follow up with Dr. Jena Gauss or an                            academic medical center such as [facility] in the future.                           - The findings and recommendations were discussed                            with the  patient. Procedure Code(s):        --- Professional ---                           [phone], Esophagogastroduodenoscopy, flexible,                            transoral; with insertion of guide wire followed by                            passage of dilator(s) through esophagus over guide                            wire                           43239, 59, Esophagogastroduodenoscopy, flexible,                            transoral; with biopsy, single or multiple Diagnosis Code(s):        --- Professional ---                           K29.70, Gastritis, unspecified, without bleeding                           K26.4,  Chronic or unspecified duodenal ulcer with                            hemorrhage                           R13.10, Dysphagia, unspecified CPT copyright 2022 [employer] Association. All rights reserved. The codes documented in this report are preliminary and upon coder review may  be revised to meet current compliance requirements. Dr Particia Lather "Alan Ripper" [place],  06/25/2023 9:03:50 AM Number of Addenda: 0

## 2023-06-25 NOTE — Interval H&P Note (Signed)
 History and Physical Interval Note:  06/25/2023 7:27 AM  [name]  has presented today for surgery, with the diagnosis of Dysphagia.  The various methods of treatment have been discussed with the patient and family. After consideration of risks, benefits and other options for treatment, the patient has consented to  Procedure(s): ESOPHAGOGASTRODUODENOSCOPY (EGD) WITH PROPOFOL (N/A) as a surgical intervention.  The patient's history has been reviewed, patient examined, no change in status, stable for surgery.  I have reviewed the patient's chart and labs.  Questions were answered to the patient's satisfaction.     Imogene Burn

## 2023-06-25 NOTE — Transfer of Care (Signed)
 Immediate Anesthesia Transfer of Care Note  Patient: [name]  Procedure(s) Performed: ESOPHAGOGASTRODUODENOSCOPY (EGD) WITH PROPOFOL SAVORY DILATION BIOPSY  Patient Location: PACU  Anesthesia Type:MAC  Level of Consciousness: drowsy and patient cooperative  Airway & Oxygen Therapy: Patient Spontanous Breathing  Post-op Assessment: Report given to RN and Post -op Vital signs reviewed and stable  Post vital signs: Reviewed and stable  Last Vitals:  Vitals Value Taken Time  BP 103/46 06/25/23 0853  Temp    Pulse 97 06/25/23 0856  Resp 16 06/25/23 0856  SpO2 100 % 06/25/23 0856  Vitals shown include unfiled device data.  Last Pain:  Vitals:   06/25/23 0750  TempSrc: Temporal  PainSc: 4       Patients Stated Pain Goal: 5 (06/24/23 1500)  Complications: No notable events documented.

## 2023-06-25 NOTE — Progress Notes (Signed)
 PROGRESS NOTE    [name]  WUJ:[id number] DOB: 1970-05-28 DOA: 06/19/2023 PCP: [name], PA-C   Brief Narrative:  53 year old past medical history significant for hypertension, hyperlipidemia, diabetes type 2, COPD, seizure disorder, lumbar endocarditis, anxiety, C. difficile colitis status post total colectomy with end ileostomy.  Patient presented secondary to altered mental status and found to have hypotension requiring vasopressor support in the ICU.  Managed for hypovolemic shock with improvement of blood pressure.   Assessment & Plan:   Principal Problem:   Sepsis (HCC) Active Problems:   Benzodiazepine (tranquilizer) overdose   Dysphagia   H/O ileostomy   1-Hypovolemic shock: -Patient presented with worsening hypotension on chronic hypotension.  Sepsis was ruled out Associated with hypothermia.  Patient was managed with Levophed on admission weaned off to 8/17. -Home midodrine restarted. -Continue with IV fluids.  -Continue with midodrine -Will keep on TED hose  2-AKI: Creatinine peak on admission 1.5 Related to hypovolemic shock Improved with IV fluids initially.  Cr increase to 1.5 (2/19). Resume IV fluids.  Improving. Continue with IV fluids  Dysphagia;  Report difficulty swallowing liquids and solid.  Has worsened recently, GI input greatly appreciated, with a clear esophageal dilation in the past, went for endoscopy 2/22, which was significant for normal esophagus, status post dilation, which was biopsied. -As well endoscopy significant for gastric and duodenal erosions with stigmata of recent bleeding.  Gastric biopsies were sent to rule out H pylori.  GI rec PPI BID for 8 weeks, then every day.  -As discussed with GI Eliquis can be resumed tomorrow, but will need to hold heparin drip today given patient had esophageal dilation  Abdominal pain Acute on chronic CT abdomen and pelvis: Changes consistent with prior colectomy and right side  ileostomy.  No acute abnormality noted.  Acute toxic metabolic encephalopathy: Diazepam overdose -Presumed secondary to benzodiazepine and narcotics overdose. -Back to baseline Psych  consulted and recommendation for no inpatient admission  Right upper extremity DVT: Diagnosed October 2024 secondary to PICC with DVT located in the right internal jugular vein and right brachial veins. Eliquis held on admission. Right upper extremity venous Dopplers remains significant for chronic DVT, will need to continue anticoagulation  Hold eliquis in anticipation endoscopy on 2/22. Start Heparin gtt. now she is status post dilation we will hold on resuming heparin GTT today, plan to resume Eliquis tomorrow as discussed with GI.  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 History of Crohn's disease -Continue with IV fluids, Imodium -Patient reports milky discharge from rectum, this is most likely due to diversion colitis, she has known history of PAD, she can resume her butyrate enema as outpatient  Normocytic  anemia: -Monitor Hb  Primary hypertension: Holding metoprolol due to hypotension and orthostasis  Paroxysmal SVT -Resume metoprolol when able  Orthostasis -Appears to be chronic problem, continue with midodrine, consent troponin stimulation test negative last December  Allergic Reaction:  Patient received atarax 2/19 , nurse notice facial and neck redness, throat tightness.  Atarax is on allergy med list. She received meds yesterday as well.  Received a dose of IV pepcid and IV solumedrol.  No further symptoms.  Atarax remove from orders.    Estimated body mass index is 22.86 kg/m as calculated from the following:   Height  as of this encounter: 5\' 4"  (1.626 m).   Weight as of this encounter: 60.4 kg.   DVT prophylaxis: Eliquis Code Status: Full code Family Communication: Discussed with patient Disposition Plan:  Status is: Inpatient Remains inpatient appropriate because: management of AKI, Hypotension.     Consultants:  Psych Gastroenterology  Procedures:  none  Antimicrobials:    Subjective:  Seen postprocedure, denies any complaints.  Objective: Vitals:   06/25/23 1037 06/25/23 1055 06/25/23 1144 06/25/23 1200  BP:    (!) 88/43  Pulse:  87    Resp: 18 14 18    Temp:    97.9 F (36.6 C)  TempSrc:      SpO2:  100%    Weight:      Height:        Intake/Output Summary (Last 24 hours) at 06/25/2023 1327 Last data filed at 06/25/2023 0849 Gross per 24 hour  Intake 793.4 ml  Output 500 ml  Net 293.4 ml   Filed Weights   06/23/23 0900 06/24/23 0457 06/25/23 0434  Weight: 62.1 kg 60.1 kg 60.4 kg    Examination:  Awake Alert, Oriented X 3 Symmetrical Chest wall movement RRR,No Gallops +ve B.Sounds, Abd Soft No Cyanosis   Data Reviewed: I have personally reviewed following labs and imaging studies  CBC: Recent Labs  Lab 06/20/23 0248 06/21/23 0804 06/22/23 0334 06/24/23 0451 06/25/23 0531  WBC 9.3 7.2 7.7 8.3 7.4  HGB 8.5* 9.8* 8.9* 9.1* 8.4*  HCT 25.4* 29.4* 27.2* 28.4* 26.3*  MCV 84.1 84.2 85.3 87.4 87.7  PLT 540* 608* 539* 520* 491*   Basic Metabolic Panel: Recent Labs  Lab 06/20/23 0248 06/21/23 0307 06/22/23 0334 06/23/23 0310 06/24/23 0749 06/25/23 0531  NA 135 133* 137 136 137 139  K 3.5 4.0 4.1 5.0 3.7 4.1  CL 106 102 107 104 108 106  CO2 19* 22 19* 24 23 25   GLUCOSE 82 84 93 111* 83 79  BUN 6 8 7  5* 8 8  CREATININE 1.10* 1.30* 1.53* 0.99 1.01* 1.11*  CALCIUM 8.0* 8.4* 8.8* 9.2 8.9 8.6*  MG 0.9* 1.9  --   --   --   --   PHOS 3.9  --   --   --   --   --    GFR: Estimated Creatinine Clearance: 50 mL/min (A) (by C-G formula based on SCr of 1.11  mg/dL (H)). Liver Function Tests: Recent Labs  Lab 06/19/23 0238  AST 24  ALT 15  ALKPHOS 95  BILITOT 0.3  PROT 6.5  ALBUMIN 3.3*   Recent Labs  Lab 06/19/23 0238  LIPASE 32   No results for input(s): "AMMONIA" in the last 168 hours. Coagulation Profile: Recent Labs  Lab 06/19/23 0238  INR 1.0   Cardiac Enzymes: No results for input(s): "CKTOTAL", "CKMB", "CKMBINDEX", "TROPONINI" in the last 168 hours. BNP (last 3 results) No results for input(s): "PROBNP" in the last 8760 hours. HbA1C: No results for input(s): "HGBA1C" in the last 72 hours. CBG: Recent Labs  Lab 06/19/23 0839 06/19/23 0925 06/19/23 1156 06/19/23 1530 06/25/23 0853  GLUCAP 93 94 90 95 78   Lipid Profile: No results for input(s): "CHOL", "HDL", "LDLCALC", "TRIG", "CHOLHDL", "LDLDIRECT" in the last 72 hours.  Thyroid Function Tests: No results for input(s): "TSH", "T4TOTAL", "FREET4", "T3FREE", "THYROIDAB" in the last 72 hours. Anemia Panel: No results for input(s): "VITAMINB12", "FOLATE", "FERRITIN", "TIBC", "IRON", "RETICCTPCT" in the last 72 hours.  Sepsis Labs: Recent Labs  Lab 06/19/23 [id number]  06/19/23 0418 06/19/23 1201  PROCALCITON  --   --  <0.10  LATICACIDVEN 1.5 2.5* 1.1    Recent Results (from the past 240 hours)  Blood Culture (routine x 2)     Status: None   Collection Time: 06/19/23  2:38 AM   Specimen: BLOOD  Result Value Ref Range Status   Specimen Description BLOOD BLOOD LEFT ARM  Final   Special Requests NONE  Final   Culture   Final    NO GROWTH 5 DAYS Performed at [facility], [address], [place], [place] [address]    Report Status 06/24/2023 FINAL  Final  Blood Culture (routine x 2)     Status: None   Collection Time: 06/19/23  2:59 AM   Specimen: BLOOD RIGHT HAND  Result Value Ref Range Status   Specimen Description BLOOD RIGHT HAND  Final   Special Requests   Final    BOTTLES DRAWN AEROBIC ONLY Blood Culture results may not be optimal due to an  inadequate volume of blood received in culture bottles   Culture   Final    NO GROWTH 5 DAYS Performed at [facility], [address] [place], [place] [address]    Report Status 06/24/2023 FINAL  Final  MRSA Next Gen by PCR, Nasal     Status: None   Collection Time: 06/19/23  9:42 AM   Specimen: Nasal Mucosa; Nasal Swab  Result Value Ref Range Status   MRSA by PCR Next Gen NOT DETECTED NOT DETECTED Final    Comment: (NOTE) The GeneXpert MRSA Assay (FDA approved for NASAL specimens only), is one component of a comprehensive MRSA colonization surveillance program. It is not intended to diagnose MRSA infection nor to guide or monitor treatment for MRSA infections. Test performance is not FDA approved in patients less than 69 years old. Performed at [facility], [address], [place], [place] [address]          Radiology Studies: DG ESOPHAGUS W SINGLE CM (SOL OR THIN BA) Result Date: 06/23/2023 CLINICAL DATA:  Dysphagia with globus sensation particularly to bread and tablets EXAM: ESOPHAGUS/BARIUM SWALLOW/TABLET STUDY TECHNIQUE: Single contrast examination was performed using thin liquid barium. This exam was performed by Corrin Parker, PA-C, and was supervised and interpreted by Dr. Malachy Moan. FLUOROSCOPY: Radiation Exposure Index (as provided by the fluoroscopic device): 6.7 mGy Kerma COMPARISON:  CT scan of the abdomen and pelvis done June 18, 2008 FINDINGS: Swallowing: Appears normal. No vestibular penetration or aspiration seen. Pharynx: Unremarkable. Esophagus: No mucosal lesions, stenosis or stricture. Esophageal motility: Tertiary contraction seen in the distal esophagus Hiatal Hernia: None. Gastroesophageal reflux: No spontaneous reflux seen and no reflux with cough or Valsalva Ingested 13 mm barium tablet: Became lodged just proximal to the gastroesophageal junction. The pill did not pass with additional sips of water or thin barium. Patient was given a bite of  Cheree Ditto cracker which allowed the tablet to pass. Other: Exam technically limited secondary to patient's immobility IMPRESSION: 1. No anatomic or structural soft tissue abnormality. 2. Mild tertiary contractions noted in the distal esophagus which could represent early nonspecific esophageal motility disorder. 3. 13 mm barium tablet did become lodged just proximal to the gastroesophageal junction but ultimately passed after the patient consumed a bit of Graham cracker and additional sips of water. Electronically Signed   By: Malachy Moan M.D.   On: 06/23/2023 15:41        Scheduled Meds:  amitriptyline  100 mg Oral  QHS   Chlorhexidine Gluconate Cloth  6 each Topical Daily   famotidine  20 mg Oral BID   folic acid  1 mg Oral Daily   gabapentin  300 mg Oral BID   Gerhardt's butt cream   Topical BID   levETIRAcetam  750 mg Oral BID   loperamide  2 mg Oral 4 times per day   metoprolol tartrate  12.5 mg Oral BID   midodrine  5 mg Oral TID WC   pantoprazole  40 mg Oral BID   thiamine  100 mg Oral Daily   Continuous Infusions:  lactated ringers 100 mL/hr at 06/24/23 2348     LOS: 6 days      Huey Bienenstock, MD [facility]   If 7PM-7AM, please contact night-coverage [email or web address]  06/25/2023, 1:27 PM

## 2023-06-26 DIAGNOSIS — R1310 Dysphagia, unspecified: Secondary | ICD-10-CM | POA: Diagnosis not present

## 2023-06-26 LAB — BASIC METABOLIC PANEL
Anion gap: 14 (ref 5–15)
BUN: 9 mg/dL (ref 6–20)
CO2: 24 mmol/L (ref 22–32)
Calcium: 9.1 mg/dL (ref 8.9–10.3)
Chloride: 101 mmol/L (ref 98–111)
Creatinine, Ser: 1.29 mg/dL — ABNORMAL HIGH (ref 0.44–1.00)
GFR, Estimated: 49 mL/min — ABNORMAL LOW (ref >60)
Glucose, Bld: 85 mg/dL (ref 70–99)
Potassium: 3.8 mmol/L (ref 3.5–5.1)
Sodium: 139 mmol/L (ref 135–145)

## 2023-06-26 LAB — CBC
HCT: 26.5 % — ABNORMAL LOW (ref 36.0–46.0)
Hemoglobin: 8.6 g/dL — ABNORMAL LOW (ref 12.0–15.0)
MCH: 28.2 pg (ref 26.0–34.0)
MCHC: 32.5 g/dL (ref 30.0–36.0)
MCV: 86.9 fL (ref 80.0–100.0)
Platelets: 497 10*3/uL — ABNORMAL HIGH (ref 150–400)
RBC: 3.05 MIL/uL — ABNORMAL LOW (ref 3.87–5.11)
RDW: 17.8 % — ABNORMAL HIGH (ref 11.5–15.5)
WBC: 8.2 10*3/uL (ref 4.0–10.5)
nRBC: 0 % (ref 0.0–0.2)

## 2023-06-26 MED ORDER — ZOLPIDEM TARTRATE 5 MG PO TABS
2.5000 mg | ORAL_TABLET | Freq: Once | ORAL | Status: AC
  Administered 2023-06-26: 2.5 mg via ORAL
  Filled 2023-06-26: qty 1

## 2023-06-26 MED ORDER — MESALAMINE 4 G RE ENEM
4.0000 g | ENEMA | Freq: Every day | RECTAL | Status: DC
  Filled 2023-06-26 (×2): qty 60

## 2023-06-26 MED ORDER — MIDODRINE HCL 5 MG PO TABS
10.0000 mg | ORAL_TABLET | Freq: Three times a day (TID) | ORAL | Status: DC
  Administered 2023-06-26 – 2023-06-27 (×6): 10 mg via ORAL
  Filled 2023-06-26 (×7): qty 2

## 2023-06-26 MED ORDER — APIXABAN 2.5 MG PO TABS
2.5000 mg | ORAL_TABLET | Freq: Two times a day (BID) | ORAL | Status: DC
  Administered 2023-06-26 – 2023-06-27 (×4): 2.5 mg via ORAL
  Filled 2023-06-26 (×5): qty 1

## 2023-06-26 MED ORDER — DICLOFENAC SODIUM 1 % EX GEL
2.0000 g | Freq: Four times a day (QID) | CUTANEOUS | Status: DC | PRN
  Administered 2023-06-26 – 2023-06-27 (×3): 2 g via TOPICAL
  Filled 2023-06-26: qty 100

## 2023-06-26 NOTE — Plan of Care (Signed)
   Problem: Education: Goal: Knowledge of General Education information will improve Description Including pain rating scale, medication(s)/side effects and non-pharmacologic comfort measures Outcome: Progressing

## 2023-06-26 NOTE — Progress Notes (Signed)
 PROGRESS NOTE    [name]  ZOX:[id number] DOB: Sep 08, 1970 DOA: 06/19/2023 PCP: [name], PA-C   Brief Narrative:  53 year old past medical history significant for hypertension, hyperlipidemia, diabetes type 2, COPD, seizure disorder, lumbar endocarditis, anxiety, C. difficile colitis status post total colectomy with end ileostomy.  Patient presented secondary to altered mental status and found to have hypotension requiring vasopressor support in the ICU.  Managed for hypovolemic shock with improvement of blood pressure.   Assessment & Plan:   Principal Problem:   Sepsis (HCC) Active Problems:   Benzodiazepine (tranquilizer) overdose   Dysphagia   H/O ileostomy   1-Hypovolemic shock: -Patient presented with worsening hypotension on chronic hypotension.  Sepsis was ruled out Associated with hypothermia.  Patient was managed with Levophed on admission weaned off to 8/17. -Home midodrine restarted. -Continue with IV fluids.  -Continue with midodrine -Will keep on TED hose  2-AKI: Creatinine peak on admission 1.5 Related to hypovolemic shock Improved with IV fluids initially.  Cr increase to 1.5 (2/19). Resume IV fluids.  Improving. Continue with IV fluids  Dysphagia;  Report difficulty swallowing liquids and solid.  Has worsened recently, GI input greatly appreciated, with a clear esophageal dilation in the past, went for endoscopy 2/22, which was significant for normal esophagus, status post dilation, which was biopsied. -As well endoscopy significant for gastric and duodenal erosions with stigmata of recent bleeding.  Gastric biopsies were sent to rule out H pylori.  GI rec PPI BID for 8 weeks, then every day.  -As discussed with GI Eliquis can be resumed 24 hours after dilation, and he remains off heparin drip after dilation, hemoglobin stable, will resume back Eliquis today.    Abdominal pain Acute on chronic CT abdomen and pelvis: Changes consistent with  prior colectomy and right side ileostomy.  No acute abnormality noted.  Acute toxic metabolic encephalopathy: Diazepam overdose -Presumed secondary to benzodiazepine and narcotics overdose. -Back to baseline Psych  consulted and recommendation for no inpatient admission  Right upper extremity DVT: Diagnosed October 2024 secondary to PICC with DVT located in the right internal jugular vein and right brachial veins. Eliquis held on admission. Right upper extremity venous Dopplers remains significant for chronic DVT, will need to continue anticoagulation  Heparin GTT, now postprocedure back on Eliquis.  Seizure disorder: -Continue with Keppra   Orthostasis -Appears to be chronic problem, continue with midodrine, will increase dose today, will place TED hose.   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  Atarax is on allergy med list. She received meds yesterday as well.  Received a dose of IV pepcid and IV solumedrol.  No further symptoms.  Atarax remove from orders.   Proctitis/diversion colitis -He does report some rectal discharge, she has been on mesalamine melena in the past, will resume, ideally upon discharge she should continue with butyrate  enemas,  which she may need to go back on when she leaves the hospital. We do not have these enemas in the hospital    Estimated body mass index is 22.78 kg/m as calculated from the following:   Height as of this encounter: 5\' 4"  (1.626 m).   Weight as of this encounter: 60.2 kg.   DVT prophylaxis: Eliquis Code Status: Full code Family Communication: Discussed with patient Disposition Plan:  Status is: Inpatient Remains inpatient appropriate because: management of AKI, Hypotension.     Consultants:  Psych Gastroenterology  Procedures:  Endoscopy,  Antimicrobials:    Subjective:  No significant events overnight, she denies any complaints today, reports good night sleep, still reports some discharge from rectum  Objective: Vitals:   06/25/23 2129 06/25/23 2303 06/26/23 0538 06/26/23 0754  BP: (!) 105/48 (!) 123/54 107/73 (!) 122/54  Pulse: 92 97 100 (!) 110  Resp:  16  18  Temp:  98 F (36.7 C) 98 F (36.7 C) 97.8 F (36.6 C)  TempSrc:  Oral Oral Oral  SpO2:  97% 94% 97%  Weight:   60.2 kg   Height:        Intake/Output Summary (Last 24 hours) at 06/26/2023 1157 Last data filed at 06/25/2023 2303 Gross per 24 hour  Intake --  Output 400 ml  Net -400 ml   Filed Weights   06/24/23 0457 06/25/23 0434 06/26/23 0538  Weight: 60.1 kg 60.4 kg 60.2 kg    Examination:  Awake Alert, Oriented X 3 Symmetrical Chest wall movement RRR,No Gallops +ve B.Sounds, Abd Soft No Cyanosis   Data Reviewed: I have personally reviewed following labs and imaging studies  CBC: Recent Labs  Lab 06/21/23 0804 06/22/23 0334 06/24/23 0451 06/25/23 0531 06/26/23 0533  WBC 7.2 7.7 8.3 7.4 8.2  HGB 9.8* 8.9* 9.1* 8.4* 8.6*  HCT 29.4* 27.2* 28.4* 26.3* 26.5*  MCV 84.2 85.3 87.4 87.7 86.9  PLT 608* 539* 520* 491* 497*   Basic Metabolic Panel: Recent Labs  Lab 06/20/23 0248 06/21/23 0307 06/22/23 0334 06/23/23 0310 06/24/23 0749 06/25/23 0531 06/26/23 0533  NA 135  133* 137 136 137 139 139  K 3.5 4.0 4.1 5.0 3.7 4.1 3.8  CL 106 102 107 104 108 106 101  CO2 19* 22 19* 24 23 25 24   GLUCOSE 82 84 93 111* 83 79 85  BUN 6 8 7  5* 8 8 9   CREATININE 1.10* 1.30* 1.53* 0.99 1.01* 1.11* 1.29*  CALCIUM 8.0* 8.4* 8.8* 9.2 8.9 8.6* 9.1  MG 0.9* 1.9  --   --   --   --   --   PHOS 3.9  --   --   --   --   --   --    GFR: Estimated Creatinine Clearance: 43.1 mL/min (A) (by C-G formula based on SCr of 1.29 mg/dL (H)). Liver Function Tests: No results for input(s): "AST", "ALT", "ALKPHOS", "BILITOT", "PROT", "ALBUMIN" in the last 168 hours.  No results for input(s): "LIPASE", "AMYLASE" in the last 168 hours.  No results for input(s): "AMMONIA" in the last 168 hours. Coagulation Profile: No results for input(s): "INR", "PROTIME" in the last 168 hours.  Cardiac Enzymes: No results for input(s): "CKTOTAL", "CKMB", "CKMBINDEX", "TROPONINI" in the last 168 hours. BNP (last 3 results) No results for input(s): "PROBNP" in the last 8760 hours. HbA1C: No results for input(s): "HGBA1C" in the last 72 hours. CBG: Recent Labs  Lab 06/19/23 1530 06/25/23 0853  GLUCAP 95 78  Lipid Profile: No results for input(s): "CHOL", "HDL", "LDLCALC", "TRIG", "CHOLHDL", "LDLDIRECT" in the last 72 hours.  Thyroid Function Tests: No results for input(s): "TSH", "T4TOTAL", "FREET4", "T3FREE", "THYROIDAB" in the last 72 hours. Anemia Panel: No results for input(s): "VITAMINB12", "FOLATE", "FERRITIN", "TIBC", "IRON", "RETICCTPCT" in the last 72 hours.  Sepsis Labs: Recent Labs  Lab 06/19/23 1201  PROCALCITON <0.10  LATICACIDVEN 1.1    Recent Results (from the past 240 hours)  Blood Culture (routine x 2)     Status: None   Collection Time: 06/19/23  2:38 AM   Specimen: BLOOD  Result Value Ref Range Status   Specimen Description BLOOD BLOOD LEFT ARM  Final   Special Requests NONE  Final   Culture   Final    NO GROWTH 5 DAYS Performed at [facility], [address] [place], [place] [address]    Report Status 06/24/2023 FINAL  Final  Blood Culture (routine x 2)     Status: None   Collection Time: 06/19/23  2:59 AM   Specimen: BLOOD RIGHT HAND  Result Value Ref Range Status   Specimen Description BLOOD RIGHT HAND  Final   Special Requests   Final    BOTTLES DRAWN AEROBIC ONLY Blood Culture results may not be optimal due to an inadequate volume of blood received in culture bottles   Culture   Final    NO GROWTH 5 DAYS Performed at [facility], [address] [place], [place] [address]    Report Status 06/24/2023 FINAL  Final  MRSA Next Gen by PCR, Nasal     Status: None   Collection Time: 06/19/23  9:42 AM   Specimen: Nasal Mucosa; Nasal Swab  Result Value Ref Range Status   MRSA by PCR Next Gen NOT DETECTED NOT DETECTED Final    Comment: (NOTE) The GeneXpert MRSA Assay (FDA approved for NASAL specimens only), is one component of a comprehensive MRSA colonization surveillance program. It is not intended to diagnose MRSA infection nor to guide or monitor treatment for MRSA infections. Test performance is not FDA approved in patients less than 81 years old. Performed at [facility], [address], [place], [place] [address]          Radiology Studies: No results found.       Scheduled Meds:  amitriptyline  100 mg Oral QHS   apixaban  2.5 mg Oral BID   Chlorhexidine Gluconate Cloth  6 each Topical Daily   famotidine  20 mg Oral BID   folic acid  1 mg Oral Daily   gabapentin  300 mg Oral BID   Gerhardt's butt cream   Topical BID   levETIRAcetam  750 mg Oral BID   loperamide  2 mg Oral 4 times per day   mesalamine  4 g Rectal QHS   metoprolol tartrate  12.5 mg Oral BID   midodrine  10 mg Oral TID WC   pantoprazole  40 mg Oral BID   thiamine  100 mg Oral Daily   Continuous Infusions:  lactated ringers 50 mL/hr at 06/26/23 0806     LOS: 7 days      Huey Bienenstock, MD [facility]   If 7PM-7AM, please  contact night-coverage [email or web address]  06/26/2023, 11:57 AM

## 2023-06-26 NOTE — Consult Note (Signed)
 WOC  Consulted for ostomy; see our notes. Patient had had multiple education sessions. Ostomy care orders are detailed and in the patients EMR from our visit this admission  Shraga Custard [facility], CNS, CWON-AP [phone]

## 2023-06-26 NOTE — Plan of Care (Signed)
  Problem: Health Behavior/Discharge Planning: Goal: Ability to manage health-related needs will improve Outcome: Progressing   Problem: Activity: Goal: Risk for activity intolerance will decrease Outcome: Progressing   Problem: Coping: Goal: Level of anxiety will decrease Outcome: Progressing   Problem: Pain Managment: Goal: General experience of comfort will improve and/or be controlled Outcome: Progressing

## 2023-06-27 DIAGNOSIS — D649 Anemia, unspecified: Secondary | ICD-10-CM

## 2023-06-27 DIAGNOSIS — E538 Deficiency of other specified B group vitamins: Secondary | ICD-10-CM | POA: Diagnosis not present

## 2023-06-27 DIAGNOSIS — R1310 Dysphagia, unspecified: Secondary | ICD-10-CM | POA: Diagnosis not present

## 2023-06-27 LAB — BASIC METABOLIC PANEL
Anion gap: 8 (ref 5–15)
BUN: 8 mg/dL (ref 6–20)
CO2: 25 mmol/L (ref 22–32)
Calcium: 8.4 mg/dL — ABNORMAL LOW (ref 8.9–10.3)
Chloride: 106 mmol/L (ref 98–111)
Creatinine, Ser: 1.3 mg/dL — ABNORMAL HIGH (ref 0.44–1.00)
GFR, Estimated: 49 mL/min — ABNORMAL LOW (ref >60)
Glucose, Bld: 85 mg/dL (ref 70–99)
Potassium: 3.8 mmol/L (ref 3.5–5.1)
Sodium: 139 mmol/L (ref 135–145)

## 2023-06-27 LAB — CBC
HCT: 24.3 % — ABNORMAL LOW (ref 36.0–46.0)
Hemoglobin: 7.9 g/dL — ABNORMAL LOW (ref 12.0–15.0)
MCH: 28.4 pg (ref 26.0–34.0)
MCHC: 32.5 g/dL (ref 30.0–36.0)
MCV: 87.4 fL (ref 80.0–100.0)
Platelets: 480 10*3/uL — ABNORMAL HIGH (ref 150–400)
RBC: 2.78 MIL/uL — ABNORMAL LOW (ref 3.87–5.11)
RDW: 18.1 % — ABNORMAL HIGH (ref 11.5–15.5)
WBC: 7.4 10*3/uL (ref 4.0–10.5)
nRBC: 0 % (ref 0.0–0.2)

## 2023-06-27 LAB — PREPARE RBC (CROSSMATCH)

## 2023-06-27 MED ORDER — ZOLPIDEM TARTRATE 5 MG PO TABS
2.5000 mg | ORAL_TABLET | Freq: Every evening | ORAL | Status: DC | PRN
  Administered 2023-06-27: 2.5 mg via ORAL
  Filled 2023-06-27: qty 1

## 2023-06-27 MED ORDER — POTASSIUM CHLORIDE CRYS ER 20 MEQ PO TBCR
40.0000 meq | EXTENDED_RELEASE_TABLET | Freq: Four times a day (QID) | ORAL | Status: AC
Start: 2023-06-27 — End: 2023-06-27
  Administered 2023-06-27 (×2): 40 meq via ORAL
  Filled 2023-06-27 (×2): qty 2

## 2023-06-27 MED ORDER — CYANOCOBALAMIN 1000 MCG/ML IJ SOLN
1000.0000 ug | Freq: Every day | INTRAMUSCULAR | Status: DC
  Administered 2023-06-27: 1000 ug via INTRAMUSCULAR
  Filled 2023-06-27 (×2): qty 1

## 2023-06-27 MED ORDER — SODIUM CHLORIDE 0.9% IV SOLUTION
Freq: Once | INTRAVENOUS | Status: DC
Start: 2023-06-27 — End: 2023-06-28

## 2023-06-27 NOTE — Progress Notes (Addendum)
 PROGRESS NOTE    [name]  JYN:[id number] DOB: 05-12-1970 DOA: 06/19/2023 PCP: [name], PA-C   Brief Narrative:  53 year old past medical history significant for hypertension, hyperlipidemia, diabetes type 2, COPD, seizure disorder, lumbar endocarditis, anxiety, C. difficile colitis status post total colectomy with end ileostomy.  Patient presented secondary to altered mental status and found to have hypotension requiring vasopressor support in the ICU.  Managed for hypovolemic shock with improvement of blood pressure.   Assessment & Plan:   Principal Problem:   Sepsis (HCC) Active Problems:   Benzodiazepine (tranquilizer) overdose   Dysphagia   H/O ileostomy   1-Hypovolemic shock: -Patient presented with worsening hypotension on chronic hypotension.  Sepsis was ruled out Associated with hypothermia.  Patient was managed with Levophed on admission weaned off to 8/17. -Home midodrine restarted. -Continue with IV fluids.  -Continue with midodrine -Will keep on TED hose  2-AKI: Creatinine peak on admission 1.5 Related to hypovolemic shock Improved with IV fluids initially.  Cr increase to 1.5 (2/19). Resume IV fluids.  Improving. Continue with IV fluids  Dysphagia;  Report difficulty swallowing liquids and solid.  Has worsened recently, GI input greatly appreciated, with a clear esophageal dilation in the past, went for endoscopy 2/22, which was significant for normal esophagus, status post dilation, which was biopsied. -As well endoscopy significant for gastric and duodenal erosions with stigmata of recent bleeding.  Gastric biopsies were sent to rule out H pylori.  GI rec PPI BID for 8 weeks, then every day.  -As discussed with GI Eliquis can be resumed 24 hours after dilation, and he remains off heparin drip after dilation, hemoglobin stable, will resume back Eliquis today.    Abdominal pain Acute on chronic CT abdomen and pelvis: Changes consistent with  prior colectomy and right side ileostomy.  No acute abnormality noted.  Acute toxic metabolic encephalopathy: Diazepam overdose -Presumed secondary to benzodiazepine and narcotics overdose. -Back to baseline Psych  consulted and recommendation for no inpatient admission  Right upper extremity DVT: Diagnosed October 2024 secondary to PICC with DVT located in the right internal jugular vein and right brachial veins. Eliquis held on admission. Right upper extremity venous Dopplers remains significant for chronic DVT, will need to continue anticoagulation  Heparin GTT, now postprocedure back on Eliquis.  Seizure disorder: -Continue with Keppra  Hypokalemia Hyponatremia -Replace, continue with IV fluids  Anemia B12 deficiency -Hemoglobin trending down, it is 7.9 this morning, given she is on full anticoagulation, recent endoscopy significant for gastric and duodenal erosions and stigmata of bleeding, she is on Eliquis, orthostatic, will transfuse 1 unit PRBC. -Will start on IM B12 supplement  Orthostasis -Appears to be chronic problem, continue with midodrine, will increase dose today, will place TED hose.   -Cosyntropin stimulation test negative last December -Patient with anemia, hemoglobin of 7.9, will transfuse 1 unit as it should help with that.  Chronic pain: -Continue with Tylenol, Flexeril, gabapentin oral Dilaudid  Depression anxiety: Evaluated by psych, no need for inpatient admission  History of total abdominal colectomy with ileostomy History of high output ostomy History of Crohn's disease -Continue with IV fluids, Imodium -Patient reports milky discharge from rectum, this is most likely due to diversion colitis, she has known history of PAD, she can resume her butyrate enema as outpatient  Normocytic  anemia: -Monitor Hb  Primary hypertension: Holding metoprolol due to hypotension and orthostasis  Paroxysmal SVT -Resume metoprolol when able  Allergic  Reaction:  Patient received atarax 2/19 , nurse  notice facial and neck redness, throat tightness.  Atarax is on allergy med list. She received meds yesterday as well.  Received a dose of IV pepcid and IV solumedrol.  No further symptoms.  Atarax remove from orders.   Proctitis/diversion colitis -He does report some rectal discharge, she has been on mesalamine melena in the past, will resume, ideally upon discharge she should continue with butyrate enemas, which she may need to go back on when she leaves the hospital. We do not have these enemas in the hospital    Estimated body mass index is 22.89 kg/m as calculated from the following:   Height as of this encounter: 5\' 4"  (1.626 m).   Weight as of this encounter: 60.5 kg.   DVT prophylaxis: Eliquis Code Status: Full code Family Communication: Discussed with patient Disposition Plan:  Status is: Inpatient Remains inpatient appropriate because: management of AKI, Hypotension.     Consultants:  Psych Gastroenterology  Procedures:  Endoscopy,  Antimicrobials:    Subjective:  No significant events overnight, she denies any coffee-ground emesis, and melena in her ileostomy bag  Objective: Vitals:   06/27/23 0500 06/27/23 0526 06/27/23 1100 06/27/23 1129  BP:  129/73 95/81 (!) 99/58  Pulse:  96 91 88  Resp:  15  12  Temp:  97.6 F (36.4 C) (!) 97.4 F (36.3 C) 98.1 F (36.7 C)  TempSrc:  Oral Oral Oral  SpO2:  95% 97% 99%  Weight: 60.5 kg     Height:        Intake/Output Summary (Last 24 hours) at 06/27/2023 1205 Last data filed at 06/27/2023 0225 Gross per 24 hour  Intake --  Output 300 ml  Net -300 ml   Filed Weights   06/25/23 0434 06/26/23 0538 06/27/23 0500  Weight: 60.4 kg 60.2 kg 60.5 kg    Examination:  Awake Alert, Oriented X 3, No new F.N deficits, Normal affect Symmetrical Chest wall movement, Good air movement bilaterally, CTAB RRR,No Gallops,Rubs or new Murmurs, No Parasternal Heave +ve  B.Sounds, Abd Soft, iliostomy present, no melena No Cyanosis, Clubbing or edema, No new Rash or bruise     Data Reviewed: I have personally reviewed following labs and imaging studies  CBC: Recent Labs  Lab 06/22/23 0334 06/24/23 0451 06/25/23 0531 06/26/23 0533 06/27/23 0550  WBC 7.7 8.3 7.4 8.2 7.4  HGB 8.9* 9.1* 8.4* 8.6* 7.9*  HCT 27.2* 28.4* 26.3* 26.5* 24.3*  MCV 85.3 87.4 87.7 86.9 87.4  PLT 539* 520* 491* 497* 480*   Basic Metabolic Panel: Recent Labs  Lab 06/21/23 0307 06/22/23 0334 06/23/23 0310 06/24/23 0749 06/25/23 0531 06/26/23 0533 06/27/23 0550  NA 133*   < > 136 137 139 139 139  K 4.0   < > 5.0 3.7 4.1 3.8 3.8  CL 102   < > 104 108 106 101 106  CO2 22   < > 24 23 25 24 25   GLUCOSE 84   < > 111* 83 79 85 85  BUN 8   < > 5* 8 8 9 8   CREATININE 1.30*   < > 0.99 1.01* 1.11* 1.29* 1.30*  CALCIUM 8.4*   < > 9.2 8.9 8.6* 9.1 8.4*  MG 1.9  --   --   --   --   --   --    < > = values in this interval not displayed.   GFR: Estimated Creatinine Clearance: 42.7 mL/min (A) (by C-G formula based on SCr of 1.3 mg/dL (  H)). Liver Function Tests: No results for input(s): "AST", "ALT", "ALKPHOS", "BILITOT", "PROT", "ALBUMIN" in the last 168 hours.  No results for input(s): "LIPASE", "AMYLASE" in the last 168 hours.  No results for input(s): "AMMONIA" in the last 168 hours. Coagulation Profile: No results for input(s): "INR", "PROTIME" in the last 168 hours.  Cardiac Enzymes: No results for input(s): "CKTOTAL", "CKMB", "CKMBINDEX", "TROPONINI" in the last 168 hours. BNP (last 3 results) No results for input(s): "PROBNP" in the last 8760 hours. HbA1C: No results for input(s): "HGBA1C" in the last 72 hours. CBG: Recent Labs  Lab 06/25/23 0853  GLUCAP 78   Lipid Profile: No results for input(s): "CHOL", "HDL", "LDLCALC", "TRIG", "CHOLHDL", "LDLDIRECT" in the last 72 hours.  Thyroid Function Tests: No results for input(s): "TSH", "T4TOTAL", "FREET4",  "T3FREE", "THYROIDAB" in the last 72 hours. Anemia Panel: No results for input(s): "VITAMINB12", "FOLATE", "FERRITIN", "TIBC", "IRON", "RETICCTPCT" in the last 72 hours.  Sepsis Labs: No results for input(s): "PROCALCITON", "LATICACIDVEN" in the last 168 hours.   Recent Results (from the past 240 hours)  Blood Culture (routine x 2)     Status: None   Collection Time: 06/19/23  2:38 AM   Specimen: BLOOD  Result Value Ref Range Status   Specimen Description BLOOD BLOOD LEFT ARM  Final   Special Requests NONE  Final   Culture   Final    NO GROWTH 5 DAYS Performed at [facility], [address], [place], [place] [address]    Report Status 06/24/2023 FINAL  Final  Blood Culture (routine x 2)     Status: None   Collection Time: 06/19/23  2:59 AM   Specimen: BLOOD RIGHT HAND  Result Value Ref Range Status   Specimen Description BLOOD RIGHT HAND  Final   Special Requests   Final    BOTTLES DRAWN AEROBIC ONLY Blood Culture results may not be optimal due to an inadequate volume of blood received in culture bottles   Culture   Final    NO GROWTH 5 DAYS Performed at [facility], [address], [place], [place] [address]    Report Status 06/24/2023 FINAL  Final  MRSA Next Gen by PCR, Nasal     Status: None   Collection Time: 06/19/23  9:42 AM   Specimen: Nasal Mucosa; Nasal Swab  Result Value Ref Range Status   MRSA by PCR Next Gen NOT DETECTED NOT DETECTED Final    Comment: (NOTE) The GeneXpert MRSA Assay (FDA approved for NASAL specimens only), is one component of a comprehensive MRSA colonization surveillance program. It is not intended to diagnose MRSA infection nor to guide or monitor treatment for MRSA infections. Test performance is not FDA approved in patients less than 28 years old. Performed at [facility], [address] [place], [place] [address]          Radiology Studies: No results found.       Scheduled Meds:  sodium chloride   Intravenous Once    amitriptyline  100 mg Oral QHS   apixaban  2.5 mg Oral BID   Chlorhexidine Gluconate Cloth  6 each Topical Daily   cyanocobalamin  1,000 mcg Intramuscular Daily   famotidine  20 mg Oral BID   folic acid  1 mg Oral Daily   gabapentin  300 mg Oral BID   Gerhardt's butt cream   Topical BID   levETIRAcetam  750 mg Oral BID   loperamide  2 mg Oral 4 times per  day   mesalamine  4 g Rectal QHS   metoprolol tartrate  12.5 mg Oral BID   midodrine  10 mg Oral TID WC   pantoprazole  40 mg Oral BID   thiamine  100 mg Oral Daily   Continuous Infusions:  lactated ringers 50 mL/hr at 06/26/23 2038     LOS: 8 days      Huey Bienenstock, MD [facility]   If 7PM-7AM, please contact night-coverage [email or web address]  06/27/2023, 12:05 PM

## 2023-06-27 NOTE — Progress Notes (Signed)
 Physical Therapy Treatment Patient Details Name: [name] MRN: [id number] DOB: [birth year] Today's Date: 06/27/2023   History of Present Illness Pt is a 53 y/o F presenting to ED on 2/16 wtih colostomy bag leaking, lethargy, and hypotension, ?accidental overdose of narcotics. Admitted for septic shock related to cellulitis at ostomy site. PMH includes Chrohn's disease, COPD, collagen vascular disease, diabetes, seizure disorder, fungal endocarditis    PT Comments  Patient resting in bed and reports need for voiding bladder at start and BSC in severe need of emptying. Educated pt to call for assist to empty and avoid build up for ease of emptying. Pt verbalized understanding. Pt completed bed<>BSC transfer, no AD, mod ind with use of hands to steady. Pt also performed ostomy care to empty at ind level. Pt agreeable to mobilize and completed DGI and stair mobility today. Pt scored a 15/24; a score of </= 19/24 is predictive of falls in the elderly, but a score of > 22/24 is indicative of safe ambulators. HR max of 117 bpm with gait and stair negotiation. Discussed benefits of follow up OPPT for balance training and pt agreeable. EOS pt resting in recliner, call bell and all needs within reach.    If plan is discharge home, recommend the following: Assistance with cooking/housework;Assist for transportation   Can travel by private vehicle        Equipment Recommendations  None recommended by PT (pt to obtain [facility] on her own)    Recommendations for Other Services       Precautions / Restrictions Precautions Precautions: Fall Recall of Precautions/Restrictions: Intact Precaution/Restrictions Comments: ostomy Restrictions Weight Bearing Restrictions Per Provider Order: No     Mobility  Bed Mobility Overal bed mobility: Modified Independent                  Transfers Overall transfer level: Modified independent Equipment used: None Transfers: Sit to/from Stand, Bed to  chair/wheelchair/BSC Sit to Stand: Modified independent (Device/Increase time)   Step pivot transfers: Modified independent (Device/Increase time)       General transfer comment: use of hands    Ambulation/Gait Ambulation/Gait assistance: Supervision Gait Distance (Feet): 800 Feet Assistive device: None, Straight cane Gait Pattern/deviations: Step-through pattern, Decreased stride length Gait velocity: fair     General Gait Details: overall steady, SPC in Rt UE, fair sequencing   Stairs Stairs: Yes Stairs assistance: Supervision Stair Management: One rail Right, Step to pattern, Forwards, With cane Number of Stairs: 12 General stair comments: sup for safety   Wheelchair Mobility     Tilt Bed    Modified Rankin (Stroke Patients Only)       Balance Overall balance assessment: Mild deficits observed, not formally tested Sitting-balance support: No upper extremity supported, Feet supported Sitting balance-Leahy Scale: Good     Standing balance support: Single extremity supported, Reliant on assistive device for balance Standing balance-Leahy Scale: Poor                   Standardized Balance Assessment Standardized Balance Assessment : Dynamic Gait Index   Dynamic Gait Index Level Surface: Mild Impairment Change in Gait Speed: Mild Impairment Gait with Horizontal Head Turns: Mild Impairment Gait with Vertical Head Turns: Mild Impairment Gait and Pivot Turn: Mild Impairment Step Over Obstacle: Mild Impairment Step Around Obstacles: Mild Impairment Steps: Moderate Impairment Total Score: 15      Communication Communication Communication: No apparent difficulties  Cognition Arousal: Alert Behavior During Therapy: WFL for tasks assessed/performed  PT - Cognitive impairments: No apparent impairments                         Following commands: Intact      Cueing Cueing Techniques: Verbal cues  Exercises      General Comments  General comments (skin integrity, edema, etc.): time spent at start of session with pt performing ostomy care to empty, ind with roll and lock opening/closure      Pertinent Vitals/Pain Pain Assessment Pain Assessment: No/denies pain    Home Living                          Prior Function            PT Goals (current goals can now be found in the care plan section) Acute Rehab PT Goals Patient Stated Goal: to return to golfing PT Goal Formulation: With patient Time For Goal Achievement: 07/05/23 Potential to Achieve Goals: Good Progress towards PT goals: Progressing toward goals    Frequency    Min 1X/week      PT Plan      Co-evaluation              AM-PAC PT "6 Clicks" Mobility   Outcome Measure  Help needed turning from your back to your side while in a flat bed without using bedrails?: None Help needed moving from lying on your back to sitting on the side of a flat bed without using bedrails?: None Help needed moving to and from a bed to a chair (including a wheelchair)?: None Help needed standing up from a chair using your arms (e.g., wheelchair or bedside chair)?: None Help needed to walk in hospital room?: A Little Help needed climbing 3-5 steps with a railing? : A Little 6 Click Score: 22    End of Session Equipment Utilized During Treatment: Gait belt Activity Tolerance: Patient tolerated treatment well Patient left: in chair;with call bell/phone within reach;with nursing/sitter in room Nurse Communication: Mobility status PT Visit Diagnosis: Other abnormalities of gait and mobility (R26.89)     Time: 2841-3244 PT Time Calculation (min) (ACUTE ONLY): 30 min  Charges:    $Gait Training: 8-22 mins $Therapeutic Activity: 8-22 mins PT General Charges $$ ACUTE PT VISIT: 1 Visit                     Wynn Maudlin, DPT Acute Rehabilitation Services Office [phone]  06/27/23 11:22 AM

## 2023-06-27 NOTE — Plan of Care (Signed)
  Problem: Education: Goal: Knowledge of General Education information will improve Description: Including pain rating scale, medication(s)/side effects and non-pharmacologic comfort measures Outcome: Progressing   Problem: Clinical Measurements: Goal: Will remain free from infection Outcome: Progressing   Problem: Activity: Goal: Risk for activity intolerance will decrease Outcome: Progressing   Problem: Coping: Goal: Level of anxiety will decrease Outcome: Progressing   Problem: Pain Managment: Goal: General experience of comfort will improve and/or be controlled Outcome: Progressing   Problem: Skin Integrity: Goal: Risk for impaired skin integrity will decrease Outcome: Progressing

## 2023-06-28 ENCOUNTER — Encounter (HOSPITAL_COMMUNITY): Payer: Self-pay | Admitting: Internal Medicine

## 2023-06-28 ENCOUNTER — Other Ambulatory Visit (HOSPITAL_COMMUNITY): Payer: Self-pay

## 2023-06-28 DIAGNOSIS — T424X1S Poisoning by benzodiazepines, accidental (unintentional), sequela: Secondary | ICD-10-CM

## 2023-06-28 DIAGNOSIS — R1310 Dysphagia, unspecified: Secondary | ICD-10-CM | POA: Diagnosis not present

## 2023-06-28 DIAGNOSIS — R571 Hypovolemic shock: Secondary | ICD-10-CM | POA: Diagnosis not present

## 2023-06-28 LAB — BASIC METABOLIC PANEL
Anion gap: 7 (ref 5–15)
BUN: 10 mg/dL (ref 6–20)
CO2: 27 mmol/L (ref 22–32)
Calcium: 8.1 mg/dL — ABNORMAL LOW (ref 8.9–10.3)
Chloride: 103 mmol/L (ref 98–111)
Creatinine, Ser: 1.26 mg/dL — ABNORMAL HIGH (ref 0.44–1.00)
GFR, Estimated: 51 mL/min — ABNORMAL LOW (ref >60)
Glucose, Bld: 89 mg/dL (ref 70–99)
Potassium: 4.9 mmol/L (ref 3.5–5.1)
Sodium: 137 mmol/L (ref 135–145)

## 2023-06-28 LAB — CBC
HCT: 29 % — ABNORMAL LOW (ref 36.0–46.0)
Hemoglobin: 9.5 g/dL — ABNORMAL LOW (ref 12.0–15.0)
MCH: 28 pg (ref 26.0–34.0)
MCHC: 32.8 g/dL (ref 30.0–36.0)
MCV: 85.5 fL (ref 80.0–100.0)
Platelets: 431 10*3/uL — ABNORMAL HIGH (ref 150–400)
RBC: 3.39 MIL/uL — ABNORMAL LOW (ref 3.87–5.11)
RDW: 18.1 % — ABNORMAL HIGH (ref 11.5–15.5)
WBC: 8 10*3/uL (ref 4.0–10.5)
nRBC: 0 % (ref 0.0–0.2)

## 2023-06-28 LAB — BPAM RBC
Blood Product Expiration Date: 202502272359
ISSUE DATE / TIME: 202502241051
Unit Type and Rh: 9500

## 2023-06-28 LAB — TYPE AND SCREEN
ABO/RH(D): B POS
Antibody Screen: NEGATIVE
Unit division: 0

## 2023-06-28 MED ORDER — ALPRAZOLAM 1 MG PO TABS: 0.5000 mg | ORAL_TABLET | Freq: Two times a day (BID) | ORAL | Status: AC | PRN

## 2023-06-28 MED ORDER — BD LUER-LOK SYRINGE 25G X 1" 3 ML MISC
0 refills | Status: DC
  Filled 2023-06-28: qty 1, 1d supply, fill #0

## 2023-06-28 MED ORDER — CYANOCOBALAMIN 1000 MCG/ML IJ SOLN
INTRAMUSCULAR | 0 refills | Status: DC
  Filled 2023-06-28: qty 1, 7d supply, fill #0

## 2023-06-28 MED ORDER — FOLIC ACID 1 MG PO TABS
1.0000 mg | ORAL_TABLET | Freq: Every day | ORAL | 0 refills | Status: DC
  Filled 2023-06-28: qty 30, 30d supply, fill #0

## 2023-06-28 MED ORDER — THIAMINE HCL 100 MG PO TABS
100.0000 mg | ORAL_TABLET | Freq: Every day | ORAL | 0 refills | Status: DC
  Filled 2023-06-28: qty 30, 30d supply, fill #0

## 2023-06-28 MED ORDER — MIDODRINE HCL 10 MG PO TABS
10.0000 mg | ORAL_TABLET | Freq: Three times a day (TID) | ORAL | 0 refills | Status: DC
  Filled 2023-06-28 (×2): qty 90, 30d supply, fill #0

## 2023-06-28 MED ORDER — HYDROMORPHONE HCL 2 MG PO TABS
2.0000 mg | ORAL_TABLET | Freq: Four times a day (QID) | ORAL | 0 refills | Status: DC | PRN
  Filled 2023-06-28: qty 15, 4d supply, fill #0

## 2023-06-28 MED ORDER — PANTOPRAZOLE SODIUM 40 MG PO TBEC
40.0000 mg | DELAYED_RELEASE_TABLET | Freq: Two times a day (BID) | ORAL | 0 refills | Status: DC
  Filled 2023-06-28: qty 60, 30d supply, fill #0

## 2023-06-28 NOTE — Progress Notes (Signed)
 Occupational Therapy Treatment Patient Details Name: [name] MRN: [id number] DOB: 1970-05-16 Today's Date: 06/28/2023   History of present illness Pt is a 53 y/o F presenting to ED on 2/16 wtih colostomy bag leaking, lethargy, and hypotension, ?accidental overdose of narcotics. Admitted for septic shock related to cellulitis at ostomy site. PMH includes Chrohn's disease, COPD, collagen vascular disease, diabetes, seizure disorder, fungal endocarditis   OT comments  Pt making excellent progress towards OT goals. Focused on full bathing/dressing tasks in prep for DC home today. Pt able to access all clothing, manage all ADLs standing at sink Independently. No LOB noted. Provided education re: fall prevention and safe med mgmt at home.       If plan is discharge home, recommend the following:  Direct supervision/assist for financial management;Direct supervision/assist for medications management   Equipment Recommendations  Tub/shower seat    Recommendations for Other Services      Precautions / Restrictions Precautions Precautions: Fall Precaution/Restrictions Comments: ostomy Restrictions Weight Bearing Restrictions Per Provider Order: No       Mobility Bed Mobility               General bed mobility comments: standing in room on entry    Transfers Overall transfer level: Independent Equipment used: None                     Balance Overall balance assessment: No apparent balance deficits (not formally assessed)                                         ADL either performed or assessed with clinical judgement   ADL Overall ADL's : Independent     Grooming: Independent;Standing;Wash/dry face   Upper Body Bathing: Independent;Standing   Lower Body Bathing: Independent;Sit to/from stand   Upper Body Dressing : Independent   Lower Body Dressing: Independent Lower Body Dressing Details (indicate cue type and reason): standing  to don mesh underwear and pants at sink, alternating one UE for support on sink as needed, no LOB             Functional mobility during ADLs: Independent (in room) General ADL Comments: Able to complete full bathing and dressing tasks in prep for DC home today. Emphasis on use of shower chair close to tub edge to maximize safety with transfers. pt reported interested in suction grab bar listed on cane booklet - no CM or SW assigned to secure chat. Eduacted pt on not placing full body weight on suction cup grab bar, also that HH therapies may be able to assist in obtaining    Extremity/Trunk Assessment Upper Extremity Assessment Upper Extremity Assessment: Overall WFL for tasks assessed;Right hand dominant   Lower Extremity Assessment Lower Extremity Assessment: Defer to PT evaluation        Vision   Vision Assessment?: No apparent visual deficits;Wears glasses for reading   Perception     Praxis     Communication Communication Communication: No apparent difficulties   Cognition Arousal: Alert Behavior During Therapy: WFL for tasks assessed/performed Cognition: No family/caregiver present to determine baseline             OT - Cognition Comments: pleasant, shows insight into deficits. attention deficits noted w/ hyperactivity at times. overall functional. may benefit from further assessment such as pill box test  Following commands: Intact        Cueing   Cueing Techniques: Verbal cues  Exercises      Shoulder Instructions       General Comments      Pertinent Vitals/ Pain       Pain Assessment Pain Assessment: No/denies pain  Home Living                                          Prior Functioning/Environment              Frequency  Min 1X/week        Progress Toward Goals  OT Goals(current goals can now be found in the care plan section)  Progress towards OT goals: Progressing toward goals  Acute  Rehab OT Goals Patient Stated Goal: home today OT Goal Formulation: With patient Time For Goal Achievement: 07/05/23 Potential to Achieve Goals: Good ADL Goals Pt Will Perform Upper Body Dressing: with modified independence;sitting Pt Will Perform Lower Body Dressing: with modified independence;sit to/from stand;sitting/lateral leans Pt Will Transfer to Toilet: with modified independence;ambulating;regular height toilet Pt Will Perform Tub/Shower Transfer: with modified independence;ambulating Additional ADL Goal #1: pt will tolerate OOB standing functional task x10 min in order to improve activity tolerance for ADLs  Plan      Co-evaluation                 AM-PAC OT "6 Clicks" Daily Activity     Outcome Measure   Help from another person eating meals?: None Help from another person taking care of personal grooming?: None Help from another person toileting, which includes using toliet, bedpan, or urinal?: A Little Help from another person bathing (including washing, rinsing, drying)?: A Little Help from another person to put on and taking off regular upper body clothing?: A Little Help from another person to put on and taking off regular lower body clothing?: A Little 6 Click Score: 20    End of Session    OT Visit Diagnosis: Unsteadiness on feet (R26.81);Other abnormalities of gait and mobility (R26.89);Muscle weakness (generalized) (M62.81)   Activity Tolerance Patient tolerated treatment well   Patient Left Other (comment);with call bell/phone within reach (EOB in room)   Nurse Communication Mobility status        Time: 8657-8469 OT Time Calculation (min): 19 min  Charges: OT General Charges $OT Visit: 1 Visit OT Treatments $Self Care/Home Management : 8-22 mins  Bradd Canary, OTR/L Acute Rehab Services Office: [phone]   Lorre Munroe 06/28/2023, 8:39 AM

## 2023-06-28 NOTE — TOC Transition Note (Signed)
 Transition of Care [facility]) - Discharge Note   Patient Details  Name: [name] MRN: [id number] Date of Birth: Oct 06, 1970  Transition [facility] CM/SW Contact:  Gordy Clement, RN Phone Number: 06/28/2023, 8:59 AM   Clinical Narrative:   Patient will DC to home today. Patient had no clothes here and no ride home  RNCM provided clothes/socks and shoed form our [employer]. Can voucher will be provided. [facility] will provide services Patient will be dcing to Daughter's house to get her car and then going home with a friend   No additional TOC needs      Final next level of care: Home/Self Care Barriers to Discharge: Continued Medical Work up   Patient Goals and CMS Choice Patient states their goals for this hospitalization and ongoing recovery are:: to go home          Discharge Placement                       Discharge Plan and Services Additional resources added to the After Visit Summary for   In-house Referral: Clinical Social Work                                   Social Drivers of Health (SDOH) Interventions SDOH Screenings   Food Insecurity: Food Insecurity Present (06/20/2023)  Housing: High Risk (06/20/2023)  Transportation Needs: Unmet Transportation Needs (06/20/2023)  Utilities: At Risk (06/20/2023)  Financial Resource Strain: Medium Risk (06/09/2023)   Received from [facility]  Physical Activity: Inactive (06/09/2023)   Received from [facility]  Social Connections: Socially Isolated (06/09/2023)   Received from [facility]  Stress: Stress Concern Present (06/09/2023)   Received from [facility]  Tobacco Use: High Risk (06/25/2023)  Health Literacy: Medium Risk (06/09/2023)   Received from [facility]     Readmission Risk Interventions    06/21/2023    2:07 PM 05/12/2023   11:42 AM 04/25/2023    9:13 AM  Readmission Risk Prevention Plan  Transportation Screening Complete Complete Complete   Medication Review (RN Care Manager) Referral to Pharmacy  Complete  PCP or Specialist appointment within 3-5 days of discharge Complete Complete Complete  HRI or Home Care Consult Complete Complete Complete  SW Recovery Care/Counseling Consult Complete Complete Complete  Palliative Care Screening Not Applicable Not Applicable Not Applicable  Skilled Nursing Facility  Not Applicable Not Applicable

## 2023-06-28 NOTE — Plan of Care (Signed)
  Problem: Clinical Measurements: Goal: Ability to maintain clinical measurements within normal limits will improve Outcome: Progressing Goal: Will remain free from infection Outcome: Progressing   Problem: Activity: Goal: Risk for activity intolerance will decrease Outcome: Progressing   Problem: Coping: Goal: Level of anxiety will decrease Outcome: Progressing   Problem: Pain Managment: Goal: General experience of comfort will improve and/or be controlled Outcome: Progressing

## 2023-06-28 NOTE — Discharge Summary (Signed)
 Physician Discharge Summary  [name] JYN:[id number] DOB: 1970/11/26 DOA: 06/19/2023  PCP: [name], PA-C  Admit date: 06/19/2023 Discharge date: 06/28/2023  Admitted From: (Home) Disposition:  (Home)  Recommendations for Outpatient Follow-up:  Follow up with PCP in 1-2 weeks Please obtain BMP/CBC in one week Please monitor B12 level after she started on IM supplements Follow on endoscopy cultures done during hospital stay  Diet recommendation:  Regular   Brief/Interim Summary:  53 year old past medical history significant for hypertension, hyperlipidemia, diabetes type 2, COPD, seizure disorder, lumbar endocarditis, anxiety, C. difficile colitis status post total colectomy with end ileostomy. Patient presented secondary to altered mental status and found to have hypotension requiring vasopressor support in the ICU. Managed for hypovolemic shock with improvement of blood pressure.   Significant events - Endoscopy by Dr. Leonides Schanz 2/22: significant for gastric and duodenal erosions with stigmata of recent bleeding.  Gastric biopsies were sent to rule out H pylori.  GI rec PPI BID for 8 weeks, then every day.      Hypovolemic shock: Septic shock ruled out -Patient presented with worsening hypotension on chronic hypotension.  Sepsis was ruled out Associated with hypothermia.  Patient was managed with Levophed on admission weaned off to 8/17. -Home midodrine restarted.  Dose was increased, treated with IV fluids, blood pressure and volume status back to baseline -Will keep on TED hose  AKI: Creatinine peak on admission 1.5 Related to hypovolemic shock Improved with IV fluids initially.     Dysphagia;  Report difficulty swallowing liquids and solid.  Has worsened recently, GI input greatly appreciated, with a clear esophageal dilation in the past, went for endoscopy 2/22, which was significant for normal esophagus, status post dilation, which was biopsied. -As well  endoscopy significant for gastric and duodenal erosions with stigmata of recent bleeding.  Gastric biopsies were sent to rule out H pylori.  GI rec PPI BID for 8 weeks, then every day.  -Resumed on Eliquis   Abdominal pain Acute on chronic CT abdomen and pelvis: Changes consistent with prior colectomy and right side ileostomy.  No acute abnormality noted.   Acute toxic metabolic encephalopathy: Diazepam overdose -Presumed secondary to benzodiazepine and narcotics overdose. -Back to baseline Psych  consulted and recommendation for no inpatient admission   Right upper extremity DVT: Diagnosed October 2024 secondary to PICC with DVT located in the right internal jugular vein and right brachial veins. Eliquis held on admission. Right upper extremity venous Dopplers remains significant for chronic DVT, will need to continue anticoagulation, she was kept on Eliquis.    Seizure disorder: -Continue with Keppra   Hypokalemia Hyponatremia -Replaced   Normocytic anemia B12 deficiency -Close hemoglobin 7.9, received 1 unit PRBC, hemoglobin stable on discharge at 9.5.Marland Kitchen -Will start on IM B12 supplement, please repeat level as an outpatient   Orthostasis -Appears to be chronic problem, and midodrine was increased, started on TED hose.  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  Atarax is on allergy med list. She received meds yesterday as well.  Received a dose of IV pepcid and IV solumedrol.  No  further symptoms.  Atarax remove from orders.     Proctitis/diversion colitis -He does report some rectal discharge, she has been on mesalamine melena in the past, resumed during hospital stay after started to develop some discharge., ideally upon discharge she should continue with butyrate enemas, which she may need to go back on when she leaves the hospital. We do not have these enemas in the hospital    Discharge Diagnoses:  Principal Problem:   Sepsis (HCC) Active Problems:   Benzodiazepine (tranquilizer) overdose   Dysphagia   H/O ileostomy    Discharge Instructions  Discharge Instructions     Diet - low sodium heart healthy   Complete by: As directed    Discharge instructions   Complete by: As directed    Follow with Primary MD [name], PA-C in 7 days   Get CBC, CMP, checked  by Primary MD next visit.    Activity: As tolerated with Full fall precautions use walker/cane & assistance as needed   Disposition Home    Diet: Regular diet   On your next visit with your primary care physician please Get Medicines reviewed and adjusted.   Please request your Prim.MD to go over all Hospital Tests and Procedure/Radiological results at the follow up, please get all Hospital records sent to your Prim MD by signing hospital release before you go home.   If you experience worsening of your admission symptoms, develop shortness of breath, life threatening emergency, suicidal or homicidal thoughts you must seek medical attention immediately by calling 911 or calling your MD immediately  if symptoms less severe.  You Must read complete instructions/literature along with all the possible adverse reactions/side effects for all the Medicines you take and that have been prescribed to you. Take any new Medicines after you have completely understood and accpet all the possible adverse reactions/side effects.   Do not drive, operating heavy machinery, perform activities at heights, swimming or participation in water activities  or provide baby sitting services if your were admitted for syncope or siezures until you have seen by Primary MD or a Neurologist and advised to do so again.  Do not drive when taking Pain medications.    Do not take more than prescribed Pain, Sleep and Anxiety Medications  Special Instructions: If you have smoked or chewed Tobacco  in the last 2 yrs please stop smoking, stop any regular Alcohol  and or any Recreational drug use.  Wear Seat belts while driving.   Please note  You were cared for by a hospitalist during your hospital stay. If you have any questions about your discharge medications or the care you received while you were in the hospital after you are discharged, you can call the unit and asked to speak with the hospitalist on call if the hospitalist that took care of you is not available. Once you are discharged, your primary care physician will handle any further medical issues. Please note that NO REFILLS for any discharge medications will be authorized once you are discharged, as it is imperative that you return to your primary care physician (or establish a relationship with a primary care physician if you do not have one) for your aftercare needs so that they can reassess your need for medications and monitor your lab values.   Increase activity slowly   Complete by: As directed    No wound care   Complete by: As directed       Allergies as of 06/28/2023  Reactions   Codeine Shortness Of Breath, Swelling, Rash   Throat swelling   Hydrocodone Shortness Of Breath, Swelling, Rash   Ketorolac Nausea And Vomiting, Swelling, Nausea Only   Pt reports n/v and swelling to throat   Morphine And Codeine Shortness Of Breath, Swelling, Rash   Penicillins Shortness Of Breath, Swelling, Other (See Comments)   Throat swells 02/06/21--TOLERATES CEFTRIAXONE    Nickel Rash   Pregabalin Other (See Comments)   Acetaminophen    Per MD patient states she can't take Tylenol because  of liver enzymes   Atarax [hydroxyzine] Itching, Swelling   Mildly swollen throat   Ativan [lorazepam] Other (See Comments)   Migraines.   Oxycodone-acetaminophen Nausea Only, Nausea And Vomiting   Strawberry Extract Swelling   Watermelon Concentrate Swelling   Albuterol Palpitations   Benadryl [diphenhydramine Hcl] Palpitations   Humira [adalimumab] Rash   Latex Rash   Remicade [infliximab] Rash   Blisters and Welts   Tramadol Rash   Rash and itching        Medication List     STOP taking these medications    DULoxetine 30 MG capsule Commonly known as: CYMBALTA   hydrOXYzine 50 MG tablet Commonly known as: ATARAX   ibuprofen 200 MG tablet Commonly known as: ADVIL   prochlorperazine 5 MG tablet Commonly known as: COMPAZINE       TAKE these medications    albuterol 1.25 MG/3ML nebulizer solution Commonly known as: ACCUNEB Take 1 ampule by nebulization every 6 (six) hours as needed for wheezing or shortness of breath.   ALPRAZolam 1 MG tablet Commonly known as: XANAX Take 0.5 tablets (0.5 mg total) by mouth 2 (two) times daily as needed for anxiety. What changed:  how much to take when to take this reasons to take this   amitriptyline 100 MG tablet Commonly known as: ELAVIL Take 100 mg by mouth at bedtime.   apixaban 2.5 MG Tabs tablet Commonly known as: ELIQUIS Take 1 tablet (2.5 mg total) by mouth 2 (two) times daily.   cetirizine 10 MG tablet Commonly known as: ZYRTEC Take 10 mg by mouth daily.   cyanocobalamin 1000 MCG/ML injection Commonly known as: VITAMIN B12 Take one injection subcutaneously weekly for 4 weeks, then once monthly   cyclobenzaprine 10 MG tablet Commonly known as: FLEXERIL Take 10 mg by mouth 3 (three) times daily as needed for muscle spasms.   feeding supplement Liqd Take 237 mLs by mouth 2 (two) times daily between meals.   folic acid 1 MG tablet Commonly known as: FOLVITE Take 1 tablet (1 mg total) by mouth daily.    gabapentin 300 MG capsule Commonly known as: NEURONTIN Take 300 mg by mouth 2 (two) times daily.   HYDROmorphone 2 MG tablet Commonly known as: DILAUDID Take 1 tablet (2 mg total) by mouth every 6 (six) hours as needed for severe pain (pain score 7-10).   levETIRAcetam 750 MG tablet Commonly known as: KEPPRA Take 750 mg by mouth 2 (two) times daily.   loperamide 2 MG capsule Commonly known as: IMODIUM Take 1 capsule (2 mg total) by mouth every 6 (six) hours.   mesalamine 4 g enema Commonly known as: ROWASA Place 60 mLs (4 g total) rectally at bedtime.   metoprolol tartrate 25 MG tablet Commonly known as: LOPRESSOR Take 0.5 tablets (12.5 mg total) by mouth 2 (two) times daily.   midodrine 10 MG tablet Commonly known as: PROAMATINE Take 1 tablet (10 mg total) by mouth 3 (three)  times daily with meals. What changed:  medication strength how much to take   nitroGLYCERIN 0.4 MG SL tablet Commonly known as: NITROSTAT Place 1 tablet (0.4 mg total) under the tongue every 5 (five) minutes as needed for chest pain.   nystatin powder Commonly known as: MYCOSTATIN/NYSTOP Apply topically 3 (three) times daily.   ondansetron 4 MG tablet Commonly known as: ZOFRAN Take 1 tablet (4 mg total) by mouth every 6 (six) hours as needed for nausea.   pantoprazole 40 MG tablet Commonly known as: PROTONIX Take 1 tablet (40 mg total) by mouth 2 (two) times daily.   thiamine 100 MG tablet Commonly known as: VITAMIN B1 Take 1 tablet (100 mg total) by mouth daily.        Follow-up Information     [facility].   Specialty: Home Health Services Why: Someone will call you to schedule first home visit. Contact information: [address] [place] [address] [phone]                Allergies  Allergen Reactions   Codeine Shortness Of Breath, Swelling and Rash    Throat swelling   Hydrocodone Shortness Of Breath, Swelling and Rash    Ketorolac Nausea And Vomiting, Swelling and Nausea Only    Pt reports n/v and swelling to throat   Morphine And Codeine Shortness Of Breath, Swelling and Rash   Penicillins Shortness Of Breath, Swelling and Other (See Comments)    Throat swells 02/06/21--TOLERATES CEFTRIAXONE     Nickel Rash   Pregabalin Other (See Comments)   Acetaminophen     Per MD patient states she can't take Tylenol because of liver enzymes   Atarax [Hydroxyzine] Itching and Swelling    Mildly swollen throat   Ativan [Lorazepam] Other (See Comments)    Migraines.   Oxycodone-Acetaminophen Nausea Only and Nausea And Vomiting   Strawberry Extract Swelling   Watermelon Concentrate Swelling   Albuterol Palpitations   Benadryl [Diphenhydramine Hcl] Palpitations   Humira [Adalimumab] Rash   Latex Rash   Remicade [Infliximab] Rash    Blisters and Welts    Tramadol Rash    Rash and itching    Consultations: Admitted by P[facility]M Gastroenterology   Procedures/Studies: DG ESOPHAGUS W SINGLE CM (SOL OR THIN BA) Result Date: 06/23/2023 CLINICAL DATA:  Dysphagia with globus sensation particularly to bread and tablets EXAM: ESOPHAGUS/BARIUM SWALLOW/TABLET STUDY TECHNIQUE: Single contrast examination was performed using thin liquid barium. This exam was performed by Corrin Parker, PA-C, and was supervised and interpreted by Dr. Malachy Moan. FLUOROSCOPY: Radiation Exposure Index (as provided by the fluoroscopic device): 6.7 mGy Kerma COMPARISON:  CT scan of the abdomen and pelvis done June 18, 2008 FINDINGS: Swallowing: Appears normal. No vestibular penetration or aspiration seen. Pharynx: Unremarkable. Esophagus: No mucosal lesions, stenosis or stricture. Esophageal motility: Tertiary contraction seen in the distal esophagus Hiatal Hernia: None. Gastroesophageal reflux: No spontaneous reflux seen and no reflux with cough or Valsalva Ingested 13 mm barium tablet: Became lodged just proximal to the gastroesophageal  junction. The pill did not pass with additional sips of water or thin barium. Patient was given a bite of Cheree Ditto cracker which allowed the tablet to pass. Other: Exam technically limited secondary to patient's immobility IMPRESSION: 1. No anatomic or structural soft tissue abnormality. 2. Mild tertiary contractions noted in the distal esophagus which could represent early nonspecific esophageal motility disorder. 3. 13 mm barium tablet did become lodged just proximal to the  gastroesophageal junction but ultimately passed after the patient consumed a bit of Graham cracker and additional sips of water. Electronically Signed   By: Malachy Moan M.D.   On: 06/23/2023 15:41   VAS [place] UPPER EXTREMITY VENOUS DUPLEX Result Date: 06/21/2023 UPPER VENOUS STUDY  Patient Name:  [name] [facility]  Date of Exam:   06/21/2023 Medical Rec #: [id number]           Accession #:    [id number] Date of Birth: December 08, 1970           Patient Gender: F Patient Age:   53 years Exam Location:  [facility] Procedure:      VAS [place] UPPER EXTREMITY VENOUS DUPLEX Referring Phys: RALPH NETTEY --------------------------------------------------------------------------------  Indications: History of DVT Anticoagulation: Eliquis prior to admission. Comparison Study: Previous exam on 02/19/2023 positive for DVT in RUE (IJV &                   brachial vein) Performing Technologist: Ernestene Mention RVT, RDMS  Examination Guidelines: A complete evaluation includes B-mode imaging, spectral Doppler, color Doppler, and power Doppler as needed of all accessible portions of each vessel. Bilateral testing is considered an integral part of a complete examination. Limited examinations for reoccurring indications may be performed as noted.  Right Findings: +----------+------------+---------+-----------+----------+-------------------+ RIGHT     CompressiblePhasicitySpontaneousProperties      Summary        +----------+------------+---------+-----------+----------+-------------------+ IJV         Partial      No        No                     Chronic       +----------+------------+---------+-----------+----------+-------------------+ Subclavian    Full       Yes       Yes                                  +----------+------------+---------+-----------+----------+-------------------+ Axillary      Full       Yes       Yes                                  +----------+------------+---------+-----------+----------+-------------------+ Brachial      Full       Yes       Yes                                  +----------+------------+---------+-----------+----------+-------------------+ Radial        Full                                                      +----------+------------+---------+-----------+----------+-------------------+ Ulnar         Full                                  Not well visualized +----------+------------+---------+-----------+----------+-------------------+ Cephalic  Not visualized    +----------+------------+---------+-----------+----------+-------------------+ Basilic       Full       Yes       Yes                                  +----------+------------+---------+-----------+----------+-------------------+ Proximal IJV os chronically occluded, however mid and distal portions are patent with phasic flow.  Left Findings: +----------+------------+---------+-----------+----------+-------+ LEFT      CompressiblePhasicitySpontaneousPropertiesSummary +----------+------------+---------+-----------+----------+-------+ Subclavian    Full       Yes       Yes                      +----------+------------+---------+-----------+----------+-------+  Summary:  Right: No evidence of superficial vein thrombosis in the upper extremity. 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  Left: No evidence of thrombosis in the subclavian.  *See table(s) above for measurements and observations.  Diagnosing physician: Lemar Livings MD Electronically signed by Lemar Livings MD on 06/21/2023 at 4:35:38 PM.    Final    [place] EKG SITE RITE Result Date: 06/20/2023 If Site Rite image not attached, placement could not be confirmed due to current cardiac rhythm.  CT ABDOMEN PELVIS W CONTRAST Result Date: 06/19/2023 CLINICAL DATA:  Abdominal pain and possible Crohn's laceration EXAM: CT ABDOMEN AND PELVIS WITH CONTRAST TECHNIQUE: Multidetector CT imaging of the abdomen and pelvis was performed using the standard protocol following bolus administration of intravenous contrast. RADIATION DOSE REDUCTION: This exam was performed according to the departmental dose-optimization program which includes automated exposure control, adjustment of the mA and/or kV according to patient size and/or use of iterative reconstruction technique. CONTRAST:  80mL OMNIPAQUE IOHEXOL 300 MG/ML  SOLN COMPARISON:  06/08/2023 FINDINGS: Lower chest: Mild atelectatic changes are noted in the right lung base. Hepatobiliary: No focal liver abnormality is seen. Status post cholecystectomy. No biliary dilatation. Pancreas: Unremarkable. No pancreatic ductal dilatation or surrounding inflammatory changes. Spleen: Normal in size without focal abnormality. Adrenals/Urinary Tract: Adrenal glands are within normal limits. Kidneys demonstrate a normal enhancement pattern bilaterally. No renal calculi or obstructive changes are seen. The bladder is well distended. Stomach/Bowel: Changes of prior colectomy are seen. Right-sided ileostomy is noted. No significant underlying inflammatory change is noted. Stomach is within normal limits. No small bowel abnormality is seen. Vascular/Lymphatic: Aortic atherosclerosis. No enlarged abdominal or pelvic lymph nodes. Reproductive: Status post hysterectomy. No adnexal masses. Other:  No abdominal wall hernia or abnormality. No abdominopelvic ascites. Musculoskeletal: No acute bony abnormality is noted. IMPRESSION: Changes consistent with prior colectomy and right-sided ileostomy. No acute abnormality noted. Electronically Signed   By: Alcide Clever M.D.   On: 06/19/2023 03:47   CT HEAD WO CONTRAST ( ) Result Date: 06/19/2023 CLINICAL DATA:  Delirium EXAM: CT HEAD WITHOUT CONTRAST TECHNIQUE: Contiguous axial images were obtained from the base of the skull through the vertex without intravenous contrast. RADIATION DOSE REDUCTION: This exam was performed according to the departmental dose-optimization program which includes automated exposure control, adjustment of the mA and/or kV according to patient size and/or use of iterative reconstruction technique. COMPARISON:  CT head 01/27/2023. FINDINGS: Brain: No evidence of acute large vascular territory infarction, hemorrhage, hydrocephalus, extra-axial collection or mass lesion/mass effect. Vascular: No hyperdense vessel. Skull: No acute fracture. Sinuses/Orbits: Clear sinuses.  No acute orbital findings. Other: No mastoid effusions. IMPRESSION: No evidence of acute intracranial abnormality. Electronically Signed  By: Feliberto Harts M.D.   On: 06/19/2023 03:39   DG Chest Port 1 View Result Date: 06/19/2023 CLINICAL DATA:  Possible sepsis EXAM: PORTABLE CHEST 1 VIEW COMPARISON:  04/23/2023 FINDINGS: Cardiac shadow is stable. Lungs are well aerated without focal infiltrate or effusion. No bony abnormality is seen. IMPRESSION: No acute abnormality noted. Electronically Signed   By: Alcide Clever M.D.   On: 06/19/2023 02:57   (Echo, Carotid, EGD, Colonoscopy, ERCP)   Endoscopy by Dr. Leonides Schanz: - -As well endoscopy significant for gastric and duodenal erosions with stigmata of recent bleeding.  Gastric biopsies were sent to rule out H pylori.  GI rec PPI BID for 8 weeks, then every day.     Subjective:  No significant events overnight,  she denies any complaints Discharge Exam: Vitals:   06/27/23 2317 06/28/23 0430  BP:  (!) 112/52  Pulse:  83  Resp: 16 14  Temp: 97.7 F (36.5 C) 97.6 F (36.4 C)  SpO2:  97%   Vitals:   06/27/23 1954 06/27/23 2055 06/27/23 2317 06/28/23 0430  BP: (!) 120/100 (!) 99/56  (!) 112/52  Pulse: 85 88  83  Resp: 15  16 14   Temp: 97.9 F (36.6 C)  97.7 F (36.5 C) 97.6 F (36.4 C)  TempSrc: Oral  Oral Oral  SpO2: 96%   97%  Weight:    60.5 kg  Height:        General: Pt is alert, awake, not in acute distress Cardiovascular: RRR, S1/S2 +, no rubs, no gallops Respiratory: CTA bilaterally, no wheezing, no rhonchi Abdominal: Soft, NT, ND, bowel sounds +, ileostomy present Extremities: no edema, no cyanosis    The results of significant diagnostics from this hospitalization (including imaging, microbiology, ancillary and laboratory) are listed below for reference.     Microbiology: Recent Results (from the past 240 hours)  Blood Culture (routine x 2)     Status: None   Collection Time: 06/19/23  2:38 AM   Specimen: BLOOD  Result Value Ref Range Status   Specimen Description BLOOD BLOOD LEFT ARM  Final   Special Requests NONE  Final   Culture   Final    NO GROWTH 5 DAYS Performed at [facility], [address] [place], [place] [address]    Report Status 06/24/2023 FINAL  Final  Blood Culture (routine x 2)     Status: None   Collection Time: 06/19/23  2:59 AM   Specimen: BLOOD RIGHT HAND  Result Value Ref Range Status   Specimen Description BLOOD RIGHT HAND  Final   Special Requests   Final    BOTTLES DRAWN AEROBIC ONLY Blood Culture results may not be optimal due to an inadequate volume of blood received in culture bottles   Culture   Final    NO GROWTH 5 DAYS Performed at [facility], [address], [place], [place] [address]    Report Status 06/24/2023 FINAL  Final  MRSA Next Gen by PCR, Nasal     Status: None   Collection Time: 06/19/23  9:42 AM   Specimen:  Nasal Mucosa; Nasal Swab  Result Value Ref Range Status   MRSA by PCR Next Gen NOT DETECTED NOT DETECTED Final    Comment: (NOTE) The GeneXpert MRSA Assay (FDA approved for NASAL specimens only), is one component of a comprehensive MRSA colonization surveillance program. It is not intended to diagnose MRSA infection nor to guide or monitor treatment for MRSA infections. Test performance is not FDA  approved in patients less than 54 years old. Performed at [facility], [address] [place], [place] [address]      Labs: BNP (last 3 results) Recent Labs    01/27/23 2352  BNP 558.2*   Basic Metabolic Panel: Recent Labs  Lab 06/24/23 0749 06/25/23 0531 06/26/23 0533 06/27/23 0550 06/28/23 0428  NA 137 139 139 139 137  K 3.7 4.1 3.8 3.8 4.9  CL 108 106 101 106 103  CO2 23 25 24 25 27   GLUCOSE 83 79 85 85 89  BUN 8 8 9 8 10   CREATININE 1.01* 1.11* 1.29* 1.30* 1.26*  CALCIUM 8.9 8.6* 9.1 8.4* 8.1*   Liver Function Tests: No results for input(s): "AST", "ALT", "ALKPHOS", "BILITOT", "PROT", "ALBUMIN" in the last 168 hours. No results for input(s): "LIPASE", "AMYLASE" in the last 168 hours. No results for input(s): "AMMONIA" in the last 168 hours. CBC: Recent Labs  Lab 06/24/23 0451 06/25/23 0531 06/26/23 0533 06/27/23 0550 06/28/23 0428  WBC 8.3 7.4 8.2 7.4 8.0  HGB 9.1* 8.4* 8.6* 7.9* 9.5*  HCT 28.4* 26.3* 26.5* 24.3* 29.0*  MCV 87.4 87.7 86.9 87.4 85.5  PLT 520* 491* 497* 480* 431*   Cardiac Enzymes: No results for input(s): "CKTOTAL", "CKMB", "CKMBINDEX", "TROPONINI" in the last 168 hours. BNP: Invalid input(s): "POCBNP" CBG: Recent Labs  Lab 06/25/23 0853  GLUCAP 78   D-Dimer No results for input(s): "DDIMER" in the last 72 hours. Hgb A1c No results for input(s): "HGBA1C" in the last 72 hours. Lipid Profile No results for input(s): "CHOL", "HDL", "LDLCALC", "TRIG", "CHOLHDL", "LDLDIRECT" in the last 72 hours. Thyroid function studies No  results for input(s): "TSH", "T4TOTAL", "T3FREE", "THYROIDAB" in the last 72 hours.  Invalid input(s): "FREET3" Anemia work up No results for input(s): "VITAMINB12", "FOLATE", "FERRITIN", "TIBC", "IRON", "RETICCTPCT" in the last 72 hours. Urinalysis    Component Value Date/Time   COLORURINE STRAW (A) 06/22/2023 1029   APPEARANCEUR CLEAR 06/22/2023 1029   LABSPEC 1.005 06/22/2023 1029   PHURINE 5.0 06/22/2023 1029   GLUCOSEU NEGATIVE 06/22/2023 1029   GLUCOSEU NEG mg/dL 69/62/9528 4132   HGBUR NEGATIVE 06/22/2023 1029   BILIRUBINUR NEGATIVE 06/22/2023 1029   KETONESUR NEGATIVE 06/22/2023 1029   PROTEINUR NEGATIVE 06/22/2023 1029   UROBILINOGEN 0.2 12/26/2013 1530   NITRITE NEGATIVE 06/22/2023 1029   LEUKOCYTESUR NEGATIVE 06/22/2023 1029   Sepsis Labs Recent Labs  Lab 06/25/23 0531 06/26/23 0533 06/27/23 0550 06/28/23 0428  WBC 7.4 8.2 7.4 8.0   Microbiology Recent Results (from the past 240 hours)  Blood Culture (routine x 2)     Status: None   Collection Time: 06/19/23  2:38 AM   Specimen: BLOOD  Result Value Ref Range Status   Specimen Description BLOOD BLOOD LEFT ARM  Final   Special Requests NONE  Final   Culture   Final    NO GROWTH 5 DAYS Performed at [facility], [address], [place], [place] [address]    Report Status 06/24/2023 FINAL  Final  Blood Culture (routine x 2)     Status: None   Collection Time: 06/19/23  2:59 AM   Specimen: BLOOD RIGHT HAND  Result Value Ref Range Status   Specimen Description BLOOD RIGHT HAND  Final   Special Requests   Final    BOTTLES DRAWN AEROBIC ONLY Blood Culture results may not be optimal due to an inadequate volume of blood received in culture bottles   Culture   Final  NO GROWTH 5 DAYS Performed at [facility], [address] [place], [place] [address]    Report Status 06/24/2023 FINAL  Final  MRSA Next Gen by PCR, Nasal     Status: None   Collection Time: 06/19/23  9:42 AM   Specimen: Nasal Mucosa; Nasal  Swab  Result Value Ref Range Status   MRSA by PCR Next Gen NOT DETECTED NOT DETECTED Final    Comment: (NOTE) The GeneXpert MRSA Assay (FDA approved for NASAL specimens only), is one component of a comprehensive MRSA colonization surveillance program. It is not intended to diagnose MRSA infection nor to guide or monitor treatment for MRSA infections. Test performance is not FDA approved in patients less than 62 years old. Performed at [facility], [address], [place], [place] [address]      Time coordinating discharge: Over 30 minutes  SIGNED:   Huey Bienenstock, MD  [facility] 06/28/2023, 4:15 PM Pager   If 7PM-7AM, please contact night-coverage [email or web address]

## 2023-06-29 ENCOUNTER — Encounter: Payer: Self-pay | Admitting: Internal Medicine

## 2023-06-29 LAB — SURGICAL PATHOLOGY

## 2023-07-12 ENCOUNTER — Telehealth (INDEPENDENT_AMBULATORY_CARE_PROVIDER_SITE_OTHER): Payer: Self-pay | Admitting: Gastroenterology

## 2023-07-12 ENCOUNTER — Telehealth: Payer: Self-pay | Admitting: Internal Medicine

## 2023-07-12 NOTE — Telephone Encounter (Signed)
 Pt came into office today asking about being seen. She was last seen by [facility] in 2023, but has been to [facility] and [facility] also. Please advise if she is still a patient of [facility] or if she needs OV. [phone]

## 2023-07-12 NOTE — Telephone Encounter (Signed)
 Pt came to front window while [name] was at lunch asking about scheduling OV. Please verify which GI doctor is seeing her. She said [facility] was her doctor, but in epic looks like she's seen several. I told her someone would call her back at [phone]

## 2023-07-12 NOTE — Telephone Encounter (Signed)
 She isn't a [facility] patient. 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 At that appointment she was told to either continue to go to [facility] or [facility]. She Chelsea's last ov note.

## 2023-07-14 NOTE — Telephone Encounter (Signed)
 I have tried calling patient twice. No answer and VM is full.

## 2023-07-20 NOTE — Progress Notes (Deleted)
 This encounter was created in error - please disregard.

## 2023-08-22 ENCOUNTER — Other Ambulatory Visit: Payer: Self-pay

## 2023-08-22 ENCOUNTER — Encounter (HOSPITAL_COMMUNITY): Payer: Self-pay

## 2023-08-22 ENCOUNTER — Emergency Department (HOSPITAL_COMMUNITY)
Admission: EM | Admit: 2023-08-22 | Discharge: 2023-08-22 | Disposition: A | Discharge status: Home / Self Care | Attending: Emergency Medicine | Admitting: Emergency Medicine

## 2023-08-22 DIAGNOSIS — R1031 Right lower quadrant pain: Secondary | ICD-10-CM | POA: Insufficient documentation

## 2023-08-22 DIAGNOSIS — R109 Unspecified abdominal pain: Secondary | ICD-10-CM

## 2023-08-22 DIAGNOSIS — Z7901 Long term (current) use of anticoagulants: Secondary | ICD-10-CM | POA: Insufficient documentation

## 2023-08-22 DIAGNOSIS — Z91040 Latex allergy status: Secondary | ICD-10-CM | POA: Diagnosis not present

## 2023-08-22 LAB — I-STAT CHEM 8, ED
BUN: 20 mg/dL (ref 6–20)
Calcium, Ion: 1.15 mmol/L (ref 1.15–1.40)
Chloride: 99 mmol/L (ref 98–111)
Creatinine, Ser: 1.5 mg/dL — ABNORMAL HIGH (ref 0.44–1.00)
Glucose, Bld: 82 mg/dL (ref 70–99)
HCT: 40 % (ref 36.0–46.0)
Hemoglobin: 13.6 g/dL (ref 12.0–15.0)
Potassium: 3.9 mmol/L (ref 3.5–5.1)
Sodium: 135 mmol/L (ref 135–145)
TCO2: 26 mmol/L (ref 22–32)

## 2023-08-22 MED ORDER — HYDROMORPHONE HCL 2 MG PO TABS: 1.0000 mg | ORAL_TABLET | Freq: Four times a day (QID) | ORAL | 0 refills | Status: DC | PRN

## 2023-08-22 MED ORDER — ACETAMINOPHEN 500 MG PO TABS
1000.0000 mg | ORAL_TABLET | Freq: Once | ORAL | Status: AC
  Administered 2023-08-22: 1000 mg via ORAL
  Filled 2023-08-22: qty 2

## 2023-08-22 MED ORDER — CEPHALEXIN 500 MG PO CAPS: 500.0000 mg | ORAL_CAPSULE | Freq: Two times a day (BID) | ORAL | 0 refills | Status: AC

## 2023-08-22 MED ORDER — CEPHALEXIN 500 MG PO CAPS
500.0000 mg | ORAL_CAPSULE | Freq: Once | ORAL | Status: AC
  Administered 2023-08-22: 500 mg via ORAL
  Filled 2023-08-22: qty 1

## 2023-08-22 MED ORDER — FENTANYL CITRATE PF 50 MCG/ML IJ SOSY
25.0000 ug | PREFILLED_SYRINGE | Freq: Once | INTRAMUSCULAR | Status: AC
  Administered 2023-08-22: 25 ug via INTRAMUSCULAR
  Filled 2023-08-22: qty 1

## 2023-08-22 NOTE — ED Provider Notes (Signed)
 [facility] AT [facility] Provider Note   CSN: [id number] Arrival date & time: 08/22/23  1016     History  Chief Complaint  Patient presents with   Colostomy bag issue    [name] is a 53 y.o. female.  HPI Patient with history of C. 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 She notes that she has had challenges with home health obtaining the correct bags, and over the past day has had inability to get a bag to adhere to her abdominal wall.  There is associated new redness in that area as well as severe pain.  No fever, no vomiting.    Home Medications Prior to Admission medications   Medication Sig Start Date End Date Taking? Authorizing Provider  cephALEXin  (KEFLEX ) 500 MG capsule Take 1 capsule (500 mg total) by mouth 2 (two) times daily for 5 days. 08/22/23 08/27/23 Yes Dorenda Gandy, MD  albuterol  (ACCUNEB ) 1.25 MG/3ML nebulizer solution Take 1 ampule by nebulization every 6 (six) hours as needed for wheezing or shortness of breath. 04/21/23   [provider]  ALPRAZolam  (XANAX ) 1 MG tablet Take 0.5 tablets (0.5 mg total) by mouth 2 (two) times daily as needed for anxiety. 06/28/23   Elgergawy, Ardia Kraft, MD  amitriptyline  (ELAVIL ) 100 MG tablet Take 100 mg by mouth at bedtime.    [provider]  apixaban  (ELIQUIS ) 2.5 MG TABS tablet Take 1 tablet (2.5 mg total) by mouth 2 (two) times daily. 04/25/23   Johnson, Clanford L, MD  cetirizine (ZYRTEC) 10 MG tablet Take 10 mg by mouth daily. 01/08/22   [provider]  cyanocobalamin  (VITAMIN B12) 1000 MCG/ML injection Take one injection subcutaneously weekly for 4 weeks, then once monthly 06/28/23   Elgergawy, Ardia Kraft, MD  cyclobenzaprine  (FLEXERIL ) 10 MG tablet Take 10 mg by mouth 3 (three) times daily as needed for muscle spasms. 06/10/23    [provider]  feeding supplement (ENSURE ENLIVE / ENSURE PLUS) LIQD Take 237 mLs by mouth 2 (two) times daily between meals. 01/04/22   Justina Oman, MD  folic acid  (FOLVITE ) 1 MG tablet Take 1 tablet (1 mg total) by mouth daily. 06/28/23   Elgergawy, Ardia Kraft, MD  gabapentin  (NEURONTIN ) 300 MG capsule Take 300 mg by mouth 2 (two) times daily. 10/15/22   [provider]  HYDROmorphone  (DILAUDID ) 2 MG tablet Take 0.5 tablets (1 mg total) by mouth every 6 (six) hours as needed for severe pain (pain score 7-10). 08/22/23   Dorenda Gandy, MD  levETIRAcetam  (KEPPRA ) 750 MG tablet Take 750 mg by mouth 2 (two) times daily. 09/30/20   [provider]  loperamide  (IMODIUM ) 2 MG capsule Take 1 capsule (2 mg total) by mouth every 6 (six) hours. 04/25/23   Johnson, Clanford L, MD  mesalamine  (ROWASA ) 4 g enema Place 60 mLs (4 g total) rectally at bedtime. Patient not taking: Reported on 06/20/2023 04/22/23   Suzette Espy, MD  metoprolol  tartrate (LOPRESSOR ) 25 MG tablet Take 0.5 tablets (12.5 mg total) by mouth 2 (two) times daily. 04/10/23   Johnson, Clanford L, MD  midodrine  (PROAMATINE ) 10 MG tablet Take 1 tablet (10 mg total) by mouth 3 (three) times daily with meals. 06/28/23   Elgergawy, Ardia Kraft, MD  nitroGLYCERIN  (NITROSTAT ) 0.4 MG SL tablet Place 1 tablet (0.4 mg total) under  the tongue every 5 (five) minutes as needed for chest pain. 04/25/23   Johnson, Clanford L, MD  nystatin  (MYCOSTATIN /NYSTOP ) powder Apply topically 3 (three) times daily. 05/12/23   Mason Sole, Pratik D, DO  ondansetron  (ZOFRAN ) 4 MG tablet Take 1 tablet (4 mg total) by mouth every 6 (six) hours as needed for nausea. 05/12/23   Mason Sole, Pratik D, DO  pantoprazole  (PROTONIX ) 40 MG tablet Take 1 tablet (40 mg total) by mouth 2 (two) times daily. 06/28/23   Elgergawy, Ardia Kraft, MD  SYRINGE-NEEDLE, DISP, 3 ML (B-D 3CC LUER-LOK SYR 25GX1") 25G X 1" 3 ML MISC Use as directed with B12 injection 06/28/23   Elgergawy, Ardia Kraft, MD  thiamine  (VITAMIN B1) 100 MG tablet Take 1 tablet (100 mg total) by mouth daily. 06/28/23   Elgergawy, Ardia Kraft, MD      Allergies    Codeine, Hydrocodone , Ketorolac , Morphine and codeine, Penicillins, Nickel, Pregabalin , Acetaminophen , Atarax  [hydroxyzine ], Ativan  [lorazepam ], Oxycodone -acetaminophen , Strawberry extract, Watermelon concentrate, Albuterol , Benadryl  [diphenhydramine  hcl], Humira  [adalimumab ], Latex, Remicade  [infliximab ], and Tramadol     Review of Systems   Review of Systems  Physical Exam Updated Vital Signs BP (!) 103/47 (BP Location: Right Arm)   Pulse 88   Temp 97.7 F (36.5 C) (Oral)   Resp 17   Ht 5\' 4"  (1.626 m)   Wt 58.5 kg   SpO2 100%   BMI 22.14 kg/m  Physical Exam Vitals and nursing note reviewed.  Constitutional:      General: She is not in acute distress.    Appearance: She is well-developed.  HENT:     Head: Normocephalic and atraumatic.  Eyes:     Conjunctiva/sclera: Conjunctivae normal.  Cardiovascular:     Rate and Rhythm: Normal rate and regular rhythm.  Pulmonary:     Effort: Pulmonary effort is normal. No respiratory distress.     Breath sounds: Normal breath sounds. No stridor.  Abdominal:     General: There is no distension.     Tenderness: There is no abdominal tenderness.     Comments: Right lower abdomen colostomy stoma healthy appearing.  Surrounding the stoma there is roughly circumferential erythematous minimally raised area without spreading beyond that region.  Skin:    General: Skin is warm and dry.  Neurological:     Mental Status: She is alert and oriented to person, place, and time.     Cranial Nerves: No cranial nerve deficit.  Psychiatric:        Mood and Affect: Mood normal.     ED Results / Procedures / Treatments   Labs (all labs ordered are listed, but only abnormal results are displayed) Labs Reviewed  I-STAT CHEM 8, ED - Abnormal; Notable for the following components:      Result Value    Creatinine, Ser 1.50 (*)    All other components within normal limits    EKG None  Radiology No results found.  Procedures Procedures    Medications Ordered in ED Medications  fentaNYL  (SUBLIMAZE ) injection 25 mcg (25 mcg Intramuscular Given 08/22/23 1139)  cephALEXin  (KEFLEX ) capsule 500 mg (500 mg Oral Given 08/22/23 1139)    ED Course/ Medical Decision Making/ A&P                                 Medical Decision Making Adult female status post colectomy with colostomy bag now presents with pain in that area erythema the area,  but no fever, tachycardia.  Concern for cutaneous infection, lower suspicion for systemic infection and some consideration of complications from back contributing to her presentation in general. Patient received analgesics, and after chart review, had assistance from nursing with replacement of colostomy bag, assistance with social work to facilitate appropriate supply provision.  Chemistry panel generally reassuring, patient encouraged to continue drink fluids, will follow-up with GI.  Amount and/or Complexity of Data Reviewed Independent Historian:     Details: Family numbers at bedside External Data Reviewed: notes.    Details: Discharge summary from this year Labs: ordered. Decision-making details documented in ED Course.  Risk Prescription drug management. Decision regarding hospitalization. Diagnosis or treatment significantly limited by social determinants of health.   12:05 PM On repeat exam patient is accompanied by family members, all questions answered.  Patient requests analgesics for discharge, states that she is only tolerant of those previously prescribed, we discussed the importance of following up with her physician for additional medications after short course here for acute pain.        Final Clinical Impression(s) / ED Diagnoses Final diagnoses:  Abdominal wall pain    Rx / DC Orders ED Discharge Orders           Ordered    HYDROmorphone  (DILAUDID ) 2 MG tablet  Every 6 hours PRN        08/22/23 1205    cephALEXin  (KEFLEX ) 500 MG capsule  2 times daily        08/22/23 1205              Dorenda Gandy, MD 08/22/23 1205

## 2023-08-22 NOTE — Discharge Instructions (Signed)
 As discussed, your evaluation today has been largely reassuring.  But, it is important that you monitor your condition carefully, and do not hesitate to return to the ED if you develop new, or concerning changes in your condition. ? ?Otherwise, please follow-up with your physician for appropriate ongoing care. ? ?

## 2023-08-22 NOTE — ED Notes (Addendum)
 CSW was consulted for colostomy supplies. CSW spoke with patient and she shared that she got her original supplies from [facility] but they sent wrong items and then states from [employer] for samples which were the right ones , but unsure if they accept her insurance. CSW called [employer] and they were able to pull patient information up in system. The rep states that two different packages were sent but according to patient they were wrong. CSW unable to assist with ordering correct bags due to not having product number of wrong ones, [facility] advised for patient to give them a call. CSW shared info with nurses. Hopefully wound nurse can supply samples until the correct supplies are ordered. CSW did inform patient to follow up with [facility] care and patient said she would once she got home. TOC signing off.    Addendum 2:17 PM  This [employer] and according to [employer] the nurse, they  had already sent patient weeks worth with expedited shipping four different times; states that based on her insurance they will not cover extra because she only gets a certain amount each month . Patient will need to call to them per alyssa so they can see why she is going through them so quickly.

## 2023-08-22 NOTE — ED Triage Notes (Signed)
 Patient BIB [facility], Patient colostomy bag had come off, yesterday. Patient stated she has received the wrong bags, that the supplier had given her urinary bag.

## 2023-08-22 NOTE — ED Notes (Signed)
 One piece Colostomy bag placed on patient.

## 2023-09-12 ENCOUNTER — Encounter (INDEPENDENT_AMBULATORY_CARE_PROVIDER_SITE_OTHER): Payer: Self-pay | Admitting: Gastroenterology

## 2023-09-12 ENCOUNTER — Ambulatory Visit (INDEPENDENT_AMBULATORY_CARE_PROVIDER_SITE_OTHER): Admitting: Gastroenterology

## 2023-09-12 VITALS — BP 107/70 | HR 86 | Temp 97.3°F | Ht 64.0 in | Wt 129.5 lb

## 2023-09-12 DIAGNOSIS — K50111 Crohn's disease of large intestine with rectal bleeding: Secondary | ICD-10-CM | POA: Diagnosis not present

## 2023-09-12 DIAGNOSIS — K50919 Crohn's disease, unspecified, with unspecified complications: Secondary | ICD-10-CM

## 2023-09-12 DIAGNOSIS — K6289 Other specified diseases of anus and rectum: Secondary | ICD-10-CM

## 2023-09-12 MED ORDER — PANTOPRAZOLE SODIUM 40 MG PO TBEC: 40.0000 mg | DELAYED_RELEASE_TABLET | Freq: Two times a day (BID) | ORAL | 3 refills | Status: DC

## 2023-09-12 NOTE — Progress Notes (Signed)
 Referring Provider: Vladimir Groves, PA-C Primary Care Physician:  Vladimir Groves, PA-C Primary GI Physician:   Chief Complaint  Patient presents with   rectal mucus    Having some rectal mucus, soreness on right side. and wanted to discuss colonoscopy results. Needs refill on folic acid .    HPI:   [name] is a 53 y.o. 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   Patient presenting today for right abdominal pain and rectal mucus  Patient seen by [facility] during admission in February 2025 for increased ostomy output and dysphagia. Underwent EGD as outlined below  C diff and GI pathogen panel were negative, she was given loperamide  for high ostomy output  Currently on skyrizi, last seen by Dr. Jeane Miguel at [facility] in June 2024   Present: Patient states she was unsure who she was supposed to be seeing for her Crohn's disease. She is unsure the last time she saw her GI doctor at [facility] She reports recent hospitalizations and has not been able to get back in here for follow up.   She states that she was previously having rectal mucus when we saw her in the hospital in December. She completed 4 weeks of mesalamine  enemas and has had no real improvement. She has continued discharge of rectal mucus and has pain to her RLQ and pain in her rectal area. She denies any blood per rectum but has seen blood in her ostomy bag. (None present today). She reports some BRB last noted yesterday in her ostomy bag. She feels that eating makes her abdominal pain worse. She is not currently on skyrizi, this was stopped after her colectomy in October.   She is  having nausea and vomiting that began last week. Her PCP sent in some anti nausea patches that are helping. She feels that dysphagia improved after EGD with dilation in February. She is requesting refill of her PPI as this helps her GERD. Also requesting pain medication today.     Last EGD: 06/2023 - Normal esophagus. Dilated. Biopsied.                           - Gastritis. Biopsied.                           - Duodenal erosions with stigmata of recent                            bleeding.  Flexible sigmoidoscopy 04/08/2023 by Dr. Riley Cheadle - Diffuse proctitis involving the rectal stump benign appearing rectal stricture. Biopsies taken. Differential includes active inflammatory bowel disease +/- diversion colitis.  Rectal stump biopsies: Predominant ulcer with granulation tissue and limited colonic mucosa.  Negative for granuloma, dysplasia or malignancy. ( rectal biopsies reveal ulcer nonspecific. 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 Begin short chain fatty acid enema in the way of a single butyrate enema 1 enema nightly x30 days)   Total colectomy 03/13/2023: Path report showed pseudomembranous colitis with mucosal necrosis.  Marked acute and chronic fibrinous serositis.  Mucosal ulceration necrosis with transmural inflammation and chronic cervicitis present in the margins.  2 benign  pericolic lymph nodes.   Flexible sigmoidoscopy 02/10/2023: - Diffuse severe inflammation was found in the rectum and in the sigmoid colon.  - No specimens collected. This severe pseudomembranous colitis appears to be a combination of longstanding Crohn's disease that has thus far failed medical management, active C. difficile infection and possible component of ischemia from the patient's cardiovascular collapse at the time of admission 2 weeks ago. There is no way of determining by appearance (nor biopsies if they were to have been taking) how much each of these conditions is contributing to the overall picture. This patient's has  a persistent septic picture, partial colonic pseudoobstruction and is failing medical management    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   GERD (gastroesophageal reflux disease)    EGD 01/2007 by Dr.Rourke small hiatal hernia s/p 56 french maloney    History of head injury    Hypertension    IBS (irritable bowel syndrome)    PSVT (paroxysmal supraventricular tachycardia) (HCC)    PTSD (post-traumatic stress disorder)    Recurrent chest pain    Seizure disorder (HCC)    Type 2 diabetes mellitus (HCC)     Past Surgical History:  Procedure Laterality Date   ABDOMINAL HYSTERECTOMY     with right salpingo oophorectomy 2005   APPENDECTOMY  2006   BALLOON DILATION N/A 02/19/2021   Procedure: BALLOON DILATION;  Surgeon: Vinetta Greening, DO;  [facility];  Service: Endoscopy;  Laterality: N/A;  Sigmoid colon stricture   BIOPSY  02/06/2021   Procedure: BIOPSY;  Surgeon: Vinetta Greening, DO;  [facility];  Service: Endoscopy;;   BIOPSY  02/19/2021   Procedure: BIOPSY;  Surgeon: Vinetta Greening, DO;  [facility];  Service: Endoscopy;;   BIOPSY  07/15/2021   Procedure: BIOPSY;  Surgeon: Ruby Corporal, MD;  [facility];  Service: Endoscopy;;   BIOPSY  04/08/2023   Procedure: BIOPSY;  Surgeon: Suzette Espy, MD;  [facility];  Service: Endoscopy;;   BIOPSY  06/25/2023   Procedure: BIOPSY;  Surgeon: Daina Drum, MD;  Location: [facility] ENDOSCOPY;  Service: Gastroenterology;;   CESAREAN SECTION     1996   CHOLECYSTECTOMY     COLECTOMY N/A 02/11/2023   Procedure: TOTAL COLECTOMY;  Surgeon: Sim Dryer, MD;  Location: [facility] OR;  Service: General;  Laterality: N/A;   COLONOSCOPY  2010   Dr. Riley Cheadle; negative except for hemorrhoids    COLOSTOMY  02/11/2023   Procedure: COLOSTOMY;  Surgeon: Sim Dryer, MD;  Location: [facility] OR;  Service: General;;   ESOPHAGEAL DILATION N/A 10/25/2014   Procedure: ESOPHAGEAL DILATION;  Surgeon: Ruby Corporal, MD;  [facility];  Service: Endoscopy;  Laterality: N/A;   ESOPHAGOGASTRODUODENOSCOPY N/A 10/25/2014   Procedure: ESOPHAGOGASTRODUODENOSCOPY (EGD);  Surgeon: Ruby Corporal, MD;  [facility];  Service: Endoscopy;  Laterality: N/A;  1250   ESOPHAGOGASTRODUODENOSCOPY (EGD) WITH PROPOFOL  N/A 06/25/2023   Procedure: ESOPHAGOGASTRODUODENOSCOPY (EGD) WITH PROPOFOL ;  Surgeon: Daina Drum, MD;  Location: [facility] ENDOSCOPY;  Service: Gastroenterology;  Laterality: N/A;   FLEXIBLE SIGMOIDOSCOPY N/A 02/06/2021   Procedure: FLEXIBLE SIGMOIDOSCOPY;  Surgeon: Vinetta Greening, DO;  [facility];  Service: Endoscopy;  Laterality: N/A;   FLEXIBLE SIGMOIDOSCOPY N/A 02/19/2021   Procedure: FLEXIBLE SIGMOIDOSCOPY;  Surgeon: Vinetta Greening, DO;  [facility];  Service: Endoscopy;  Laterality: N/A;   FLEXIBLE SIGMOIDOSCOPY N/A 02/02/2023   Procedure: FLEXIBLE SIGMOIDOSCOPY;  Surgeon: Nannette Babe, MD;  Location: [facility] ENDOSCOPY;  Service: Gastroenterology;  Laterality: N/A;   FLEXIBLE SIGMOIDOSCOPY N/A 02/10/2023   Procedure: FLEXIBLE SIGMOIDOSCOPY;  Surgeon: Albertina Hugger, MD;  Location: [facility] ENDOSCOPY;  Service: Gastroenterology;  Laterality: N/A;   FLEXIBLE SIGMOIDOSCOPY N/A 04/08/2023   Procedure: FLEXIBLE SIGMOIDOSCOPY;  Surgeon: Suzette Espy, MD;  [facility];  Service: Endoscopy;  Laterality: N/A;   IR FLUORO GUIDE CV LINE LEFT  02/21/2023   IR US  GUIDE VASC ACCESS LEFT  02/21/2023   LAPAROTOMY N/A 02/11/2023   Procedure: EXPLORATORY LAPAROTOMY;  Surgeon: Sim Dryer, MD;  Location: [facility] OR;  Service: General;  Laterality: N/A;   OOPHORECTOMY     left for torsion and ovarian fibroma; uterine myoma resected 1995   SAVORY DILATION N/A  06/25/2023   Procedure: SAVORY DILATION;  Surgeon: Daina Drum, MD;  Location: [facility] ENDOSCOPY;  Service: Gastroenterology;  Laterality: N/A;   SIGMOIDOSCOPY  07/15/2021   Procedure: SIGMOIDOSCOPY;  Surgeon: Ruby Corporal, MD;  [facility];  Service: Endoscopy;;   TRANSESOPHAGEAL ECHOCARDIOGRAM (CATH LAB) N/A 02/18/2023   Procedure: TRANSESOPHAGEAL ECHOCARDIOGRAM;  Surgeon: Harrold Lincoln, MD;  Location: [facility] INVASIVE CV LAB;  Service: Cardiovascular;  Laterality: N/A;    Current Outpatient Medications  Medication Sig Dispense Refill   albuterol  (ACCUNEB ) 1.25 MG/3ML nebulizer solution Take 1 ampule by nebulization every 6 (six) hours as needed for wheezing or shortness of breath.     ALPRAZolam  (XANAX ) 1 MG tablet Take 0.5 tablets (0.5 mg total) by mouth 2 (two) times daily as needed for anxiety.     amitriptyline  (ELAVIL ) 75 MG tablet Take 75 mg by mouth at bedtime.     cetirizine (ZYRTEC) 10 MG tablet Take 10 mg by mouth daily.     cyclobenzaprine  (FLEXERIL ) 10 MG tablet Take 10 mg by mouth 3 (three) times daily as needed for muscle spasms.     feeding supplement (ENSURE ENLIVE / ENSURE PLUS) LIQD Take 237 mLs by mouth 2 (two) times daily between meals.     folic acid  (FOLVITE ) 1 MG tablet Take 1 tablet (1 mg total) by mouth daily. 30 tablet 0   gabapentin  (NEURONTIN ) 300 MG capsule Take 300 mg by mouth 2 (two) times daily.     levETIRAcetam  (KEPPRA ) 750 MG tablet Take 750 mg by mouth 2 (two) times daily.     loperamide  (IMODIUM ) 2 MG capsule Take 1 capsule (2 mg total) by mouth every 6 (six) hours. 30 capsule 0   metoprolol  tartrate (LOPRESSOR ) 25 MG tablet Take 0.5 tablets (12.5 mg total) by mouth 2 (two) times daily. 30 tablet 1   nitroGLYCERIN  (NITROSTAT ) 0.4 MG SL tablet Place 1 tablet (0.4 mg total) under the tongue every 5 (five) minutes as needed for chest pain. 30 tablet 0   nystatin  (MYCOSTATIN /NYSTOP ) powder Apply topically 3 (three) times daily. 15 g 0    pantoprazole  (PROTONIX ) 40 MG tablet Take 1 tablet (40 mg total) by mouth 2 (two) times daily. 60 tablet 0   scopolamine (TRANSDERM-SCOP) 1 MG/3DAYS Place 1 patch onto the skin every 3 (three) days. As needed     SYRINGE-NEEDLE, DISP, 3 ML (B-D 3CC LUER-LOK SYR 25GX1") 25G X 1" 3 ML  MISC Use as directed with B12 injection 1 each 0   apixaban  (ELIQUIS ) 2.5 MG TABS tablet Take 1 tablet (2.5 mg total) by mouth 2 (two) times daily. (Patient not taking: Reported on 09/12/2023) 60 tablet 0   cyanocobalamin  (VITAMIN B12) 1000 MCG/ML injection Take one injection subcutaneously weekly for 4 weeks, then once monthly (Patient not taking: Reported on 09/12/2023) 1 mL 0   midodrine  (PROAMATINE ) 10 MG tablet Take 1 tablet (10 mg total) by mouth 3 (three) times daily with meals. (Patient not taking: Reported on 09/12/2023) 90 tablet 0   ondansetron  (ZOFRAN ) 4 MG tablet Take 1 tablet (4 mg total) by mouth every 6 (six) hours as needed for nausea. (Patient not taking: Reported on 09/12/2023) 20 tablet 0   thiamine  (VITAMIN B1) 100 MG tablet Take 1 tablet (100 mg total) by mouth daily. (Patient not taking: Reported on 09/12/2023) 30 tablet 0   No current facility-administered medications for this visit.    Allergies as of 09/12/2023 - Review Complete 09/12/2023  Allergen Reaction Noted   Codeine Shortness Of Breath, Swelling, and Rash    Hydrocodone  Shortness Of Breath, Swelling, and Rash 12/26/2012   Ketorolac  Nausea And Vomiting, Swelling, and Nausea Only 06/18/2022   Morphine and codeine Shortness Of Breath, Swelling, and Rash 12/26/2012   Penicillins Shortness Of Breath, Swelling, and Other (See Comments)    Nickel Rash 11/19/2021   Pregabalin  Other (See Comments) 06/18/2021   Acetaminophen   12/26/2012   Atarax  [hydroxyzine ] Itching and Swelling 02/23/2023   Ativan  [lorazepam ] Other (See Comments) 12/20/2016   Oxycodone -acetaminophen  Nausea Only and Nausea And Vomiting 03/22/2023   Strawberry extract Swelling  07/22/2011   Watermelon concentrate Swelling 07/22/2011   Albuterol  Palpitations 07/08/2021   Benadryl  [diphenhydramine  hcl] Palpitations 09/23/2011   Humira  [adalimumab ] Rash 12/24/2021   Latex Rash    Remicade  [infliximab ] Rash 07/08/2021   Tramadol  Rash 01/12/2022    Social History   Socioeconomic History   Marital status: Divorced    Spouse name: Not on file   Number of children: 1   Years of education: Not on file   Highest education level: Not on file  Occupational History   Not on file  Tobacco Use   Smoking status: Every Day    Current packs/day: 0.50    Average packs/day: 0.5 packs/day for 39.0 years (19.5 ttl pk-yrs)    Types: Cigarettes    Start date: 09/26/1984    Passive exposure: Current   Smokeless tobacco: Never  Vaping Use   Vaping status: Never Used  Substance and Sexual Activity   Alcohol  use: No    Alcohol /week: 0.0 standard drinks of alcohol    Drug use: Yes    Types: Marijuana   Sexual activity: Yes    Birth control/protection: Surgical  Other Topics Concern   Not on file  Social History Narrative   Not on file   Social Drivers of Health   Financial Resource Strain: Medium Risk (06/09/2023)   Received from [facility]   Overall Financial Resource Strain (CARDIA)    Difficulty of Paying Living Expenses: Somewhat hard  Food Insecurity: Food Insecurity Present (06/20/2023)   Hunger Vital Sign    Worried About Running Out of Food in the Last Year: Sometimes true    Ran Out of Food in the Last Year: Sometimes true  Transportation Needs: Unmet Transportation Needs (06/20/2023)   PRAPARE - Administrator, Civil Service (Medical): Yes    Lack of Transportation (Non-Medical): Yes  Physical Activity: Inactive (06/09/2023)   Received from [facility]   Exercise Vital Sign    Days of Exercise per Week: 0 days    Minutes of Exercise per Session: 0 min  Stress: Stress Concern Present (06/09/2023)   Received from [facility] - Occupational Stress Questionnaire    Feeling of Stress : To some extent  Social Connections: Socially Isolated (06/09/2023)   Received from [facility]   Social Connection and Isolation Panel [NHANES]    Frequency of Communication with Friends and Family: Twice a week    Frequency of Social Gatherings with Friends and Family: Twice a week    Attends Religious Services: Never    Diplomatic Services operational officer: No    Attends Engineer, structural: Never    Marital Status: Divorced    Review of systems General: negative for malaise, night sweats, fever, chills, weight loss Neck: Negative for lumps, goiter, pain and significant neck swelling Resp: Negative for cough, wheezing, dyspnea at rest CV: Negative for chest pain, leg swelling, palpitations, orthopnea GI: denies melena, hematochezia, nausea, vomiting, diarrhea, constipation, dysphagia, odyonophagia, early satiety or unintentional weight loss. +RLQ pain +rectal mucus/pain  MSK: Negative for joint pain or swelling, back pain, and muscle pain. Derm: Negative for itching or rash Psych: Denies depression, anxiety, memory loss, confusion. No homicidal or suicidal ideation.  Heme: Negative for prolonged bleeding, bruising easily, and swollen nodes. Endocrine: Negative for cold or heat intolerance, polyuria, polydipsia and goiter. Neuro: negative for tremor, gait imbalance, syncope and seizures. The remainder of the review of systems is noncontributory.  Physical Exam: There were no vitals taken for this visit. General:   Alert and oriented. No distress noted. Pleasant and cooperative.  Head:  Normocephalic and atraumatic. Eyes:  Conjuctiva clear without scleral icterus. Mouth:  Oral mucosa pink and moist. Good dentition. No lesions. Heart: Normal rate and rhythm, s1 and s2 heart sounds present.  Lungs: Clear lung sounds in all lobes. Respirations equal and unlabored. Abdomen:  +BS,  soft, non-tender and non-distended. No rebound or guarding. No HSM or masses noted. Derm: No palmar erythema or jaundice Msk:  Symmetrical without gross deformities. Normal posture. Extremities:  Without edema. Neurologic:  Alert and  oriented x4 Psych:  Alert and cooperative. Normal mood and affect.  Invalid input(s): "6 MONTHS"   ASSESSMENT: [name] is a 53 y.o. female presenting today for follow up of Crohn's disease, s/p colectomy   Patient with longstanding stricturing Crohn's colitis complicated by C. Difficile, ischemic colon, cardiac arrest who underwent total colectomy at the end of 2024,  with remaining 15 cm of rectal stump, Permanent ileostomy.  Postop course complicated by fungal bacteremia for which she was previously managed by ID for. 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 Was supposed to start Butyrate enemas x30 days but appears she was not able to travel to [place] to obtain these. She presents today with ongoing rectal mucus and discomfort, stating she feels symptoms never really improved after treatment in December with mesalamine  enemas. Case discussed with Dr. Riley Cheadle, given patient's complexity. Recommend attempting to get patient on butyrate enemas again x1 month, and reassess thereafter for need for repeat Flex sig if not clinically improving.  PLAN:  -continue protonix  40mg  BID -will attempt again to send Butryate enemas x4 weeks -consider flex sig if symptoms persist after enema course  All questions were answered, patient verbalized understanding and is in agreement with plan as outlined above.   Follow Up: 3 months   Casyn Becvar L. Lue Sykora, MSN, APRN, AGNP-C Adult-Gerontology Nurse Practitioner [facility]  for GI Diseases

## 2023-09-12 NOTE — Patient Instructions (Signed)
 I have refilled your pantoprazole  40mg  twice daily for acid reflux I will discuss your case with Dr. Riley Cheadle, however, we may repeat a flexible sigmoidoscopy to re evaluate ongoing inflammation seen in December as you feel symptoms never improved with enemas, I will be in touch once I discuss with him  Follow up 3 months

## 2023-09-19 ENCOUNTER — Telehealth (INDEPENDENT_AMBULATORY_CARE_PROVIDER_SITE_OTHER): Payer: Self-pay

## 2023-09-19 NOTE — Telephone Encounter (Signed)
 Patient calling to follow up on What was discussed with Dr. Riley Cheadle   Per ov 09/12/2023  I have refilled your pantoprazole  40mg  twice daily for acid reflux I will discuss your case with Dr. Riley Cheadle, however, we may repeat a flexible sigmoidoscopy to re evaluate ongoing inflammation seen in December as you feel symptoms never improved with enemas, I will be in touch once I discuss with him    Please advise. Thanks

## 2023-09-19 NOTE — Telephone Encounter (Signed)
 Yes, patient is agreeable to having you send these in for her. She doesn't remember why she could not get them in December.

## 2023-09-21 NOTE — Telephone Encounter (Signed)
 I spoke with the pharmacy [facility] in [place] [phone]. She says they can compound this for us . Please give me the dose, directions,and refills, and I will call this in to them. Thanks

## 2023-09-22 NOTE — Telephone Encounter (Signed)
 I called the enemas in to [facility] spoke with [facility] the pharmacist there. She says the Enemas come in 52mmol/L (60 ml/dose) patient to take one enema nightly for 30 days and no refills.

## 2023-09-27 NOTE — Telephone Encounter (Signed)
 I called the patient and made her aware the enemas have been sent to [facility] in [place]. Patient says they reached out to her and she can not afford this as it cost $394.00. Please advise if recommend something else. Thanks,

## 2023-09-29 NOTE — Telephone Encounter (Signed)
 I called and left a message asked that the patient please return call to the office.

## 2023-09-30 NOTE — Telephone Encounter (Signed)
 I spoke with the patient and made her aware per [facility],  Unfortunately these enemas are the best treatment for the type of proctitis she has, we could potentially try corticosteroid enemas, though she may not have complete resolution of her proctitis with these  Patient says she will try the other enemas to call those in. Please advise strength and directions. Thanks,

## 2023-10-03 ENCOUNTER — Other Ambulatory Visit (INDEPENDENT_AMBULATORY_CARE_PROVIDER_SITE_OTHER): Payer: Self-pay | Admitting: Gastroenterology

## 2023-10-03 MED ORDER — HYDROCORTISONE 100 MG/60ML RE ENEM: 1.0000 | ENEMA | Freq: Every day | RECTAL | 0 refills | Status: DC

## 2023-10-03 NOTE — Telephone Encounter (Signed)
 Patient made aware of the suggestion below, and does want this sent to one of those pharmacies she will ask them to apply a Good Rx card to the medications.

## 2023-10-03 NOTE — Telephone Encounter (Signed)
 I spoke with the patient and made her aware the medication was sent to [facility], and reminded her to have them apply a Good Rx card when she goes to pick up the medication.

## 2023-10-04 NOTE — Telephone Encounter (Signed)
 I spoke with [name] the pharmacist at [facility] in [place], he says the script went through with a $ 4 co payment. I spoke with the patient and made her aware of this. She states understanding.

## 2023-10-04 NOTE — Telephone Encounter (Signed)
 Per [facility] the patient has medicaid and is unable to use a discount card as this is against the [employer] per Pharmacist.

## 2023-10-17 ENCOUNTER — Inpatient Hospital Stay (HOSPITAL_COMMUNITY)
Admission: EM | Admit: 2023-10-17 | Discharge: 2023-10-20 | DRG: 394 | Disposition: A | Discharge status: Home / Self Care | Attending: Internal Medicine | Admitting: Internal Medicine

## 2023-10-17 ENCOUNTER — Emergency Department (HOSPITAL_COMMUNITY)

## 2023-10-17 ENCOUNTER — Encounter (HOSPITAL_COMMUNITY): Payer: Self-pay | Admitting: Emergency Medicine

## 2023-10-17 ENCOUNTER — Other Ambulatory Visit: Payer: Self-pay

## 2023-10-17 DIAGNOSIS — R1084 Generalized abdominal pain: Secondary | ICD-10-CM

## 2023-10-17 DIAGNOSIS — E871 Hypo-osmolality and hyponatremia: Secondary | ICD-10-CM | POA: Diagnosis present

## 2023-10-17 DIAGNOSIS — J4489 Other specified chronic obstructive pulmonary disease: Secondary | ICD-10-CM | POA: Diagnosis present

## 2023-10-17 DIAGNOSIS — Z604 Social exclusion and rejection: Secondary | ICD-10-CM | POA: Diagnosis present

## 2023-10-17 DIAGNOSIS — Z79899 Other long term (current) drug therapy: Secondary | ICD-10-CM

## 2023-10-17 DIAGNOSIS — Z86711 Personal history of pulmonary embolism: Secondary | ICD-10-CM | POA: Diagnosis not present

## 2023-10-17 DIAGNOSIS — E860 Dehydration: Secondary | ICD-10-CM

## 2023-10-17 DIAGNOSIS — Z90710 Acquired absence of both cervix and uterus: Secondary | ICD-10-CM

## 2023-10-17 DIAGNOSIS — G8929 Other chronic pain: Secondary | ICD-10-CM | POA: Diagnosis present

## 2023-10-17 DIAGNOSIS — I10 Essential (primary) hypertension: Secondary | ICD-10-CM | POA: Diagnosis not present

## 2023-10-17 DIAGNOSIS — F1120 Opioid dependence, uncomplicated: Secondary | ICD-10-CM | POA: Diagnosis present

## 2023-10-17 DIAGNOSIS — Z5986 Financial insecurity: Secondary | ICD-10-CM

## 2023-10-17 DIAGNOSIS — L0390 Cellulitis, unspecified: Secondary | ICD-10-CM | POA: Diagnosis present

## 2023-10-17 DIAGNOSIS — F419 Anxiety disorder, unspecified: Secondary | ICD-10-CM | POA: Diagnosis present

## 2023-10-17 DIAGNOSIS — Z90721 Acquired absence of ovaries, unilateral: Secondary | ICD-10-CM

## 2023-10-17 DIAGNOSIS — F17210 Nicotine dependence, cigarettes, uncomplicated: Secondary | ICD-10-CM | POA: Diagnosis present

## 2023-10-17 DIAGNOSIS — R0789 Other chest pain: Secondary | ICD-10-CM | POA: Diagnosis present

## 2023-10-17 DIAGNOSIS — Z5948 Other specified lack of adequate food: Secondary | ICD-10-CM

## 2023-10-17 DIAGNOSIS — Y838 Other surgical procedures as the cause of abnormal reaction of the patient, or of later complication, without mention of misadventure at the time of the procedure: Secondary | ICD-10-CM | POA: Diagnosis present

## 2023-10-17 DIAGNOSIS — L03311 Cellulitis of abdominal wall: Secondary | ICD-10-CM | POA: Diagnosis present

## 2023-10-17 DIAGNOSIS — Z8674 Personal history of sudden cardiac arrest: Secondary | ICD-10-CM

## 2023-10-17 DIAGNOSIS — K5090 Crohn's disease, unspecified, without complications: Secondary | ICD-10-CM | POA: Diagnosis present

## 2023-10-17 DIAGNOSIS — I119 Hypertensive heart disease without heart failure: Secondary | ICD-10-CM | POA: Diagnosis present

## 2023-10-17 DIAGNOSIS — E876 Hypokalemia: Secondary | ICD-10-CM | POA: Diagnosis present

## 2023-10-17 DIAGNOSIS — Z8249 Family history of ischemic heart disease and other diseases of the circulatory system: Secondary | ICD-10-CM | POA: Diagnosis not present

## 2023-10-17 DIAGNOSIS — K9402 Colostomy infection: Principal | ICD-10-CM | POA: Diagnosis present

## 2023-10-17 DIAGNOSIS — K219 Gastro-esophageal reflux disease without esophagitis: Secondary | ICD-10-CM | POA: Diagnosis present

## 2023-10-17 DIAGNOSIS — E0590 Thyrotoxicosis, unspecified without thyrotoxic crisis or storm: Secondary | ICD-10-CM | POA: Diagnosis present

## 2023-10-17 DIAGNOSIS — Z5982 Transportation insecurity: Secondary | ICD-10-CM

## 2023-10-17 DIAGNOSIS — Z7901 Long term (current) use of anticoagulants: Secondary | ICD-10-CM

## 2023-10-17 DIAGNOSIS — F4310 Post-traumatic stress disorder, unspecified: Secondary | ICD-10-CM | POA: Diagnosis present

## 2023-10-17 DIAGNOSIS — Z9079 Acquired absence of other genital organ(s): Secondary | ICD-10-CM

## 2023-10-17 DIAGNOSIS — I4510 Unspecified right bundle-branch block: Secondary | ICD-10-CM | POA: Diagnosis present

## 2023-10-17 DIAGNOSIS — R200 Anesthesia of skin: Secondary | ICD-10-CM | POA: Diagnosis present

## 2023-10-17 DIAGNOSIS — Z9049 Acquired absence of other specified parts of digestive tract: Secondary | ICD-10-CM

## 2023-10-17 DIAGNOSIS — Z5941 Food insecurity: Secondary | ICD-10-CM

## 2023-10-17 DIAGNOSIS — Z833 Family history of diabetes mellitus: Secondary | ICD-10-CM

## 2023-10-17 DIAGNOSIS — N179 Acute kidney failure, unspecified: Principal | ICD-10-CM | POA: Diagnosis present

## 2023-10-17 DIAGNOSIS — F329 Major depressive disorder, single episode, unspecified: Secondary | ICD-10-CM | POA: Diagnosis present

## 2023-10-17 DIAGNOSIS — R569 Unspecified convulsions: Secondary | ICD-10-CM

## 2023-10-17 DIAGNOSIS — Z87828 Personal history of other (healed) physical injury and trauma: Secondary | ICD-10-CM

## 2023-10-17 DIAGNOSIS — K50919 Crohn's disease, unspecified, with unspecified complications: Secondary | ICD-10-CM | POA: Diagnosis present

## 2023-10-17 DIAGNOSIS — G40909 Epilepsy, unspecified, not intractable, without status epilepticus: Secondary | ICD-10-CM | POA: Diagnosis present

## 2023-10-17 DIAGNOSIS — E1110 Type 2 diabetes mellitus with ketoacidosis without coma: Secondary | ICD-10-CM | POA: Diagnosis present

## 2023-10-17 DIAGNOSIS — N760 Acute vaginitis: Secondary | ICD-10-CM | POA: Diagnosis present

## 2023-10-17 DIAGNOSIS — E119 Type 2 diabetes mellitus without complications: Secondary | ICD-10-CM | POA: Diagnosis present

## 2023-10-17 DIAGNOSIS — K50119 Crohn's disease of large intestine with unspecified complications: Secondary | ICD-10-CM | POA: Diagnosis not present

## 2023-10-17 DIAGNOSIS — Z86718 Personal history of other venous thrombosis and embolism: Secondary | ICD-10-CM

## 2023-10-17 LAB — BASIC METABOLIC PANEL WITH GFR
Anion gap: 18 — ABNORMAL HIGH (ref 5–15)
BUN: 37 mg/dL — ABNORMAL HIGH (ref 6–20)
CO2: 21 mmol/L — ABNORMAL LOW (ref 22–32)
Calcium: 9.5 mg/dL (ref 8.9–10.3)
Chloride: 92 mmol/L — ABNORMAL LOW (ref 98–111)
Creatinine, Ser: 2.5 mg/dL — ABNORMAL HIGH (ref 0.44–1.00)
GFR, Estimated: 22 mL/min — ABNORMAL LOW (ref >60)
Glucose, Bld: 101 mg/dL — ABNORMAL HIGH (ref 70–99)
Potassium: 4 mmol/L (ref 3.5–5.1)
Sodium: 131 mmol/L — ABNORMAL LOW (ref 135–145)

## 2023-10-17 LAB — URINALYSIS, W/ REFLEX TO CULTURE (INFECTION SUSPECTED)
Bacteria, UA: NONE SEEN
Bilirubin Urine: NEGATIVE
Glucose, UA: NEGATIVE mg/dL
Hgb urine dipstick: NEGATIVE
Ketones, ur: NEGATIVE mg/dL
Nitrite: NEGATIVE
Protein, ur: NEGATIVE mg/dL
Specific Gravity, Urine: 1.01 (ref 1.005–1.030)
pH: 5 (ref 5.0–8.0)

## 2023-10-17 LAB — TROPONIN I (HIGH SENSITIVITY)
Troponin I (High Sensitivity): 3 ng/L (ref <18)
Troponin I (High Sensitivity): 3 ng/L (ref <18)

## 2023-10-17 LAB — HEPATIC FUNCTION PANEL
ALT: 47 U/L — ABNORMAL HIGH (ref 0–44)
AST: 36 U/L (ref 15–41)
Albumin: 4.1 g/dL (ref 3.5–5.0)
Alkaline Phosphatase: 176 U/L — ABNORMAL HIGH (ref 38–126)
Bilirubin, Direct: <0.1 mg/dL (ref 0.0–0.2)
Total Bilirubin: 0.8 mg/dL (ref 0.0–1.2)
Total Protein: 8.3 g/dL — ABNORMAL HIGH (ref 6.5–8.1)

## 2023-10-17 LAB — CBC
HCT: 40.2 % (ref 36.0–46.0)
Hemoglobin: 13.9 g/dL (ref 12.0–15.0)
MCH: 29.8 pg (ref 26.0–34.0)
MCHC: 34.6 g/dL (ref 30.0–36.0)
MCV: 86.3 fL (ref 80.0–100.0)
Platelets: 480 10*3/uL — ABNORMAL HIGH (ref 150–400)
RBC: 4.66 MIL/uL (ref 3.87–5.11)
RDW: 12.8 % (ref 11.5–15.5)
WBC: 11 10*3/uL — ABNORMAL HIGH (ref 4.0–10.5)
nRBC: 0 % (ref 0.0–0.2)

## 2023-10-17 LAB — GLUCOSE, CAPILLARY: Glucose-Capillary: 128 mg/dL — ABNORMAL HIGH (ref 70–99)

## 2023-10-17 LAB — D-DIMER, QUANTITATIVE: D-Dimer, Quant: 0.34 ug{FEU}/mL (ref 0.00–0.50)

## 2023-10-17 LAB — BETA-HYDROXYBUTYRIC ACID: Beta-Hydroxybutyric Acid: 0.15 mmol/L (ref 0.05–0.27)

## 2023-10-17 MED ORDER — NYSTATIN 100000 UNIT/GM EX CREA
TOPICAL_CREAM | Freq: Two times a day (BID) | CUTANEOUS | Status: DC
  Filled 2023-10-17: qty 15

## 2023-10-17 MED ORDER — HYDROCORTISONE 100 MG/60ML RE ENEM: 1.0000 | ENEMA | Freq: Every day | RECTAL | Status: DC

## 2023-10-17 MED ORDER — INSULIN ASPART 100 UNIT/ML IJ SOLN: 0.0000 [IU] | Freq: Three times a day (TID) | INTRAMUSCULAR | Status: DC

## 2023-10-17 MED ORDER — ENSURE ENLIVE PO LIQD
237.0000 mL | Freq: Two times a day (BID) | ORAL | Status: DC
  Administered 2023-10-18 – 2023-10-20 (×5): 237 mL via ORAL
  Filled 2023-10-17 (×7): qty 237

## 2023-10-17 MED ORDER — ALPRAZOLAM 0.5 MG PO TABS
0.5000 mg | ORAL_TABLET | Freq: Two times a day (BID) | ORAL | Status: DC | PRN
  Administered 2023-10-18 – 2023-10-19 (×5): 0.5 mg via ORAL
  Filled 2023-10-17 (×6): qty 1

## 2023-10-17 MED ORDER — PANTOPRAZOLE SODIUM 40 MG PO TBEC
40.0000 mg | DELAYED_RELEASE_TABLET | Freq: Two times a day (BID) | ORAL | Status: DC
  Administered 2023-10-17 – 2023-10-20 (×6): 40 mg via ORAL
  Filled 2023-10-17 (×6): qty 1

## 2023-10-17 MED ORDER — FENTANYL CITRATE PF 50 MCG/ML IJ SOSY
50.0000 ug | PREFILLED_SYRINGE | Freq: Once | INTRAMUSCULAR | Status: AC
  Administered 2023-10-17: 50 ug via INTRAVENOUS
  Filled 2023-10-17: qty 1

## 2023-10-17 MED ORDER — ONDANSETRON HCL 4 MG PO TABS
4.0000 mg | ORAL_TABLET | Freq: Four times a day (QID) | ORAL | Status: DC | PRN
  Filled 2023-10-17: qty 1

## 2023-10-17 MED ORDER — FLUCONAZOLE IN SODIUM CHLORIDE 400-0.9 MG/200ML-% IV SOLN
400.0000 mg | INTRAVENOUS | Status: DC
  Administered 2023-10-18: 400 mg via INTRAVENOUS
  Filled 2023-10-17 (×3): qty 200

## 2023-10-17 MED ORDER — AMITRIPTYLINE HCL 25 MG PO TABS
75.0000 mg | ORAL_TABLET | Freq: Every day | ORAL | Status: DC
  Administered 2023-10-17 – 2023-10-19 (×3): 75 mg via ORAL
  Filled 2023-10-17 (×3): qty 3

## 2023-10-17 MED ORDER — LOPERAMIDE HCL 2 MG PO CAPS: 2.0000 mg | ORAL_CAPSULE | Freq: Four times a day (QID) | ORAL | Status: DC

## 2023-10-17 MED ORDER — GABAPENTIN 300 MG PO CAPS
300.0000 mg | ORAL_CAPSULE | Freq: Two times a day (BID) | ORAL | Status: DC
  Administered 2023-10-17 – 2023-10-20 (×6): 300 mg via ORAL
  Filled 2023-10-17 (×6): qty 1

## 2023-10-17 MED ORDER — ONDANSETRON HCL 4 MG/2ML IJ SOLN
4.0000 mg | Freq: Four times a day (QID) | INTRAMUSCULAR | Status: DC | PRN
  Administered 2023-10-17: 4 mg via INTRAVENOUS

## 2023-10-17 MED ORDER — ENOXAPARIN SODIUM 30 MG/0.3ML IJ SOSY
30.0000 mg | PREFILLED_SYRINGE | INTRAMUSCULAR | Status: DC
  Administered 2023-10-17 – 2023-10-18 (×2): 30 mg via SUBCUTANEOUS
  Filled 2023-10-17 (×2): qty 0.3

## 2023-10-17 MED ORDER — LACTATED RINGERS IV SOLN: INTRAVENOUS | Status: DC

## 2023-10-17 MED ORDER — NITROGLYCERIN 0.4 MG SL SUBL: 0.4000 mg | SUBLINGUAL_TABLET | SUBLINGUAL | Status: DC | PRN

## 2023-10-17 MED ORDER — METOPROLOL TARTRATE 25 MG PO TABS
12.5000 mg | ORAL_TABLET | Freq: Two times a day (BID) | ORAL | Status: DC
  Administered 2023-10-20: 12.5 mg via ORAL
  Filled 2023-10-17 (×5): qty 1

## 2023-10-17 MED ORDER — SCOPOLAMINE 1 MG/3DAYS TD PT72
1.0000 | MEDICATED_PATCH | TRANSDERMAL | Status: DC | PRN
  Administered 2023-10-18: 1.5 mg via TRANSDERMAL
  Filled 2023-10-17 (×2): qty 1

## 2023-10-17 MED ORDER — INSULIN ASPART 100 UNIT/ML IJ SOLN: 0.0000 [IU] | Freq: Every day | INTRAMUSCULAR | Status: DC

## 2023-10-17 MED ORDER — LOPERAMIDE HCL 2 MG PO CAPS
2.0000 mg | ORAL_CAPSULE | Freq: Four times a day (QID) | ORAL | Status: DC | PRN
  Administered 2023-10-17 – 2023-10-18 (×2): 2 mg via ORAL
  Filled 2023-10-17 (×2): qty 1

## 2023-10-17 MED ORDER — FOLIC ACID 1 MG PO TABS
1.0000 mg | ORAL_TABLET | Freq: Every day | ORAL | Status: DC
  Administered 2023-10-17 – 2023-10-20 (×4): 1 mg via ORAL
  Filled 2023-10-17 (×4): qty 1

## 2023-10-17 MED ORDER — LEVETIRACETAM 500 MG PO TABS
750.0000 mg | ORAL_TABLET | Freq: Two times a day (BID) | ORAL | Status: DC
  Administered 2023-10-17 – 2023-10-20 (×6): 750 mg via ORAL
  Filled 2023-10-17 (×7): qty 1

## 2023-10-17 MED ORDER — LACTATED RINGERS IV BOLUS
1000.0000 mL | Freq: Once | INTRAVENOUS | Status: AC
  Administered 2023-10-17: 1000 mL via INTRAVENOUS

## 2023-10-17 MED ORDER — FENTANYL CITRATE PF 50 MCG/ML IJ SOSY
12.5000 ug | PREFILLED_SYRINGE | INTRAMUSCULAR | Status: DC | PRN
  Administered 2023-10-17: 12.5 ug via INTRAVENOUS
  Administered 2023-10-19: 25 ug via INTRAVENOUS
  Administered 2023-10-19: 12.5 ug via INTRAVENOUS
  Administered 2023-10-20: 25 ug via INTRAVENOUS
  Administered 2023-10-20: 50 ug via INTRAVENOUS
  Filled 2023-10-17 (×7): qty 1

## 2023-10-17 MED ORDER — LORATADINE 10 MG PO TABS
10.0000 mg | ORAL_TABLET | Freq: Every day | ORAL | Status: DC
  Administered 2023-10-17 – 2023-10-20 (×4): 10 mg via ORAL
  Filled 2023-10-17 (×4): qty 1

## 2023-10-17 MED ORDER — ALBUTEROL SULFATE (2.5 MG/3ML) 0.083% IN NEBU: 1.5000 mL | INHALATION_SOLUTION | Freq: Four times a day (QID) | RESPIRATORY_TRACT | Status: DC | PRN

## 2023-10-17 NOTE — Progress Notes (Signed)
 Pt arrived from ED via stretcher to room 318. Pt ambulatory from stretcher to bed unassisted, gait stable. Pt A&O x4. Only c/o pain is at peri-ostomy site under ostomy wafer. VSS. Oriented to room and safety procedures, call bell within reach, pt states understanding.

## 2023-10-17 NOTE — ED Provider Notes (Signed)
 [place] EMERGENCY DEPARTMENT AT [facility] Provider Note   CSN: [id number] Arrival date & time: 10/17/23  1155     Patient presents with: Chest Pain   [name] is a 53 y.o. female.    Chest Pain Associated symptoms: abdominal pain      53 year old female with medical history significant for anxiety, GERD, IBS, Crohn's disease status post total colectomy with colostomy bag in place who presents to the emergency department with multiple complaints.  The patient states that she has had 5 out of 10 chest pain which has been present for the last day.  Chest pain is not ripping or tearing.  Pain is described as substernal.  She also endorses dysuria and is concerned that she is developing infection around her colostomy.  She endorses abdominal pain.  She has had some blistering and redness around her colostomy site where her bag attaches.  She endorses somewhat normal colostomy output, denies any significant decreased p.o. intake.  She does feel dehydrated however.  She endorses a rash along her groin as well.  Prior to Admission medications   Medication Sig Start Date End Date Taking? Authorizing Provider  hydrocortisone  (CORTENEMA ) 100 MG/60ML enema Place 1 enema (100 mg total) rectally at bedtime for 21 days. 10/03/23 10/24/23  Carlan, Fidel Huddle, NP  albuterol  (ACCUNEB ) 1.25 MG/3ML nebulizer solution Take 1 ampule by nebulization every 6 (six) hours as needed for wheezing or shortness of breath. 04/21/23   [provider]  ALPRAZolam  (XANAX ) 1 MG tablet Take 0.5 tablets (0.5 mg total) by mouth 2 (two) times daily as needed for anxiety. 06/28/23   Elgergawy, Ardia Kraft, MD  amitriptyline  (ELAVIL ) 75 MG tablet Take 75 mg by mouth at bedtime.    [provider]  cetirizine (ZYRTEC) 10 MG tablet Take 10 mg by mouth daily. 01/08/22   [provider]  feeding supplement (ENSURE ENLIVE / ENSURE PLUS) LIQD Take 237 mLs by mouth 2 (two) times daily between  meals. 01/04/22   Justina Oman, MD  folic acid  (FOLVITE ) 1 MG tablet Take 1 tablet (1 mg total) by mouth daily. 06/28/23   Elgergawy, Ardia Kraft, MD  gabapentin  (NEURONTIN ) 300 MG capsule Take 300 mg by mouth 2 (two) times daily. 10/15/22   [provider]  levETIRAcetam  (KEPPRA ) 750 MG tablet Take 750 mg by mouth 2 (two) times daily. 09/30/20   [provider]  loperamide  (IMODIUM ) 2 MG capsule Take 1 capsule (2 mg total) by mouth every 6 (six) hours. 04/25/23   Johnson, Clanford L, MD  metoprolol  tartrate (LOPRESSOR ) 25 MG tablet Take 0.5 tablets (12.5 mg total) by mouth 2 (two) times daily. 04/10/23   Johnson, Clanford L, MD  nitroGLYCERIN  (NITROSTAT ) 0.4 MG SL tablet Place 1 tablet (0.4 mg total) under the tongue every 5 (five) minutes as needed for chest pain. 04/25/23   Johnson, Clanford L, MD  ondansetron  (ZOFRAN ) 4 MG tablet Take 1 tablet (4 mg total) by mouth every 6 (six) hours as needed for nausea. Patient not taking: Reported on 09/12/2023 05/12/23   Doreene Gammon D, DO  pantoprazole  (PROTONIX ) 40 MG tablet Take 1 tablet (40 mg total) by mouth 2 (two) times daily. 09/12/23   Carlan, Chelsea L, NP  scopolamine (TRANSDERM-SCOP) 1 MG/3DAYS Place 1 patch onto the skin every 3 (three) days. As needed    [provider]    Allergies: Codeine, Hydrocodone , Ketorolac , Morphine and codeine, Penicillins, Nickel, Pregabalin , Acetaminophen , Atarax  [hydroxyzine ], Ativan  [lorazepam ], Oxycodone -acetaminophen ,  Strawberry extract, Watermelon concentrate, Albuterol , Benadryl  [diphenhydramine  hcl], Humira  [adalimumab ], Latex, Remicade  [infliximab ], and Tramadol     Review of Systems  Cardiovascular:  Positive for chest pain.  Gastrointestinal:  Positive for abdominal pain.  Skin:  Positive for rash.  All other systems reviewed and are negative.   Updated Vital Signs BP (!) 116/96   Pulse 88   Temp 98 F (36.7 C) (Oral)   Resp 20   SpO2 100%   Physical Exam Vitals and nursing  note reviewed.  Constitutional:      General: She is not in acute distress.    Appearance: She is well-developed.  HENT:     Head: Normocephalic and atraumatic.     Mouth/Throat:     Mouth: Mucous membranes are dry.   Eyes:     Conjunctiva/sclera: Conjunctivae normal.    Cardiovascular:     Rate and Rhythm: Normal rate and regular rhythm.     Heart sounds: No murmur heard. Pulmonary:     Effort: Pulmonary effort is normal. No respiratory distress.     Breath sounds: Normal breath sounds.  Abdominal:     Palpations: Abdomen is soft.     Tenderness: There is generalized abdominal tenderness.     Comments: Colostomy bag in place with some surrounding tenderness to palpation  Genitourinary:    Comments: Mild erythematous rash to the groin appears to be yeast like versus dermatitis  Musculoskeletal:        General: No swelling.     Cervical back: Neck supple.   Skin:    General: Skin is warm and dry.     Capillary Refill: Capillary refill takes less than 2 seconds.   Neurological:     Mental Status: She is alert.   Psychiatric:        Mood and Affect: Mood normal.     (all labs ordered are listed, but only abnormal results are displayed) Labs Reviewed  BASIC METABOLIC PANEL WITH GFR - Abnormal; Notable for the following components:      Result Value   Sodium 131 (*)    Chloride 92 (*)    CO2 21 (*)    Glucose, Bld 101 (*)    BUN 37 (*)    Creatinine, Ser 2.50 (*)    GFR, Estimated 22 (*)    Anion gap 18 (*)    All other components within normal limits  CBC - Abnormal; Notable for the following components:   WBC 11.0 (*)    Platelets 480 (*)    All other components within normal limits  HEPATIC FUNCTION PANEL - Abnormal; Notable for the following components:   Total Protein 8.3 (*)    ALT 47 (*)    Alkaline Phosphatase 176 (*)    All other components within normal limits  D-DIMER, QUANTITATIVE  BETA-HYDROXYBUTYRIC ACID  URINALYSIS, W/ REFLEX TO CULTURE  (INFECTION SUSPECTED)  TROPONIN I (HIGH SENSITIVITY)  TROPONIN I (HIGH SENSITIVITY)    EKG: EKG Interpretation Date/Time:  Monday October 17 2023 12:04:50 EDT Ventricular Rate:  80 PR Interval:  161 QRS Duration:  113 QT Interval:  422 QTC Calculation: 487 R Axis:   78  Text Interpretation: Sinus rhythm LAE, consider biatrial enlargement Incomplete right bundle branch block Borderline prolonged QT interval Confirmed by Rosealee Concha ([id number]) on 10/17/2023 12:29:47 PM  Radiology: CT ABDOMEN PELVIS WO CONTRAST Result Date: 10/17/2023 CLINICAL DATA:  Abdominal pain nonlocalized EXAM: CT ABDOMEN AND PELVIS WITHOUT CONTRAST TECHNIQUE: Multidetector CT imaging of the abdomen  and pelvis was performed following the standard protocol without IV contrast. RADIATION DOSE REDUCTION: This exam was performed according to the departmental dose-optimization program which includes automated exposure control, adjustment of the mA and/or kV according to patient size and/or use of iterative reconstruction technique. COMPARISON:  June 19 2023 FINDINGS: Lower chest: Comparison with prior examination accounting for differences in techniques there is no significant change in the lobular density along the anterior margin of the right lower lobe measuring 10 mm in maximum diameter. Although is better defined on today's examination than on prior examination. Follow-up CT chest is recommended for better characterization of these nodular density and close interval follow-up 6-12 months is recommended Lung-RADS 3, probably benign findings. Short-term follow-up in 6 months is recommended with repeat low-dose chest CT without contrast (please use the following order, CT CHEST LCS NODULE FOLLOW-UP W/O CM). Hepatobiliary: No focal liver abnormality is seen. Status post cholecystectomy. No biliary dilatation. Pancreas: Pancreas normal size. No masses calcifications or inflammatory changes. Spleen: Spleen normal size.  No masses.  Adrenals/Urinary Tract: Adrenal glands are normal size. Follow-up recommended. Kidneys are normal. No masses calcifications or hydronephrosis Stomach/Bowel: No small or large bowel obstruction or inflammatory changes. Moderate amount of residual fecal material throughout the colon without obstruction or constipation. No change in the right lower quadrant ileostomy or colostomy correlate clinically. Vascular/Lymphatic: No significant vascular findings are present. No enlarged abdominal or pelvic lymph nodes. Reproductive: .  No masses. Bladder unremarkable. Other: Anterior abdominal wall unremarkable without evidence of umbilical or inguinal hernias Musculoskeletal: Visualized portion of the thoracolumbar spine and pelvic structures grossly unremarkable without evidence of fracture bony abnormalities or soft tissue masses. IMPRESSION: *No acute intra-abdominal or pelvic pathology. *No change in the right lower quadrant ileostomy or colostomy correlate clinically. *Moderate amount of residual fecal material throughout the colon without obstruction or constipation. *Status post cholecystectomy. *No change in the lobular density along the anterior margin of the right lower lobe measuring 10 mm in maximum diameter. Although is better defined on today's examination than on prior examination. Follow-up CT chest is recommended for better characterization of these nodular density and close interval follow-up 6-12 months is recommended. Electronically Signed   By: Fredrich Jefferson M.D.   On: 10/17/2023 15:15   DG Chest Port 1 View Result Date: 10/17/2023 CLINICAL DATA:  Chest pain EXAM: PORTABLE CHEST 1 VIEW COMPARISON:  X-ray 06/19/2023 FINDINGS: No consolidation, pneumothorax or effusion. No edema. Normal cardiopericardial silhouette. Overlapping cardiac leads. IMPRESSION: No acute cardiopulmonary disease. Electronically Signed   By: Adrianna Horde M.D.   On: 10/17/2023 12:36     Procedures   Medications Ordered in the  ED  nystatin  cream (MYCOSTATIN ) (has no administration in time range)  lactated ringers  infusion (has no administration in time range)  fluconazole (DIFLUCAN) IVPB 400 mg (has no administration in time range)  lactated ringers  bolus 1,000 mL (1,000 mLs Intravenous New Bag/Given 10/17/23 1320)  fentaNYL  (SUBLIMAZE ) injection 50 mcg (50 mcg Intravenous Given 10/17/23 1320)                                    Medical Decision Making Amount and/or Complexity of Data Reviewed Labs: ordered. Radiology: ordered.  Risk Prescription drug management. Decision regarding hospitalization.    53 year old female with medical history significant for anxiety, GERD, IBS, Crohn's disease status post total colectomy with colostomy bag in place who presents to the emergency department  with multiple complaints.  The patient states that she has had 5 out of 10 chest pain which has been present for the last day.  Chest pain is not ripping or tearing.  Pain is described as substernal.  She also endorses dysuria and is concerned that she is developing infection around her colostomy.  She endorses abdominal pain.  She has had some blistering and redness around her colostomy site where her bag attaches.  She endorses somewhat normal colostomy output, denies any significant decreased p.o. intake.  She does feel dehydrated however.  She endorses a rash along her groin as well.  On arrival, the patient was afebrile, not tachycardic or tachypneic, initially soft blood pressure 92/59, improved with fluid resuscitation to 113/75, saturating 97% on room air.  Physical exam revealed a rash in the groin consistent with likely dermatitis versus developing vaginal candidiasis.  Some tenderness on exam, consider intra-abdominal infectious etiology, bowel obstruction, dehydration.  Considered electrolyte abnormality.  EKG: Sinus rhythm, borderline prolonged QT interval at 487, no STEMI.  Chest x-ray revealed no acute cardiopulmonary  disease.  Labs: CBC with a leukocytosis to 11, BMP with an AKI with a creatinine of 2.5 from a baseline of 1-1.5, cardiac troponins normal, D-dimer negative.  CT abdomen pelvis: IMPRESSION:  *No acute intra-abdominal or pelvic pathology.  *No change in the right lower quadrant ileostomy or colostomy  correlate clinically.  *Moderate amount of residual fecal material throughout the colon  without obstruction or constipation.  *Status post cholecystectomy.  *No change in the lobular density along the anterior margin of the  right lower lobe measuring 10 mm in maximum diameter. Although is  better defined on today's examination than on prior examination.  Follow-up CT chest is recommended for better characterization of  these nodular density and close interval follow-up 6-12 months is  recommended.    In the patient's AKI, hospitalist medicine was consulted for admission for further rehydration, suspect mild dehydration.  Dr. Odella Bending was consulted and accepted the patient in admission.     Final diagnoses:  AKI (acute kidney injury) (HCC)  Atypical chest pain  Generalized abdominal pain  Acute vaginitis  Dehydration    ED Discharge Orders     None          Rosealee Concha, MD 10/17/23 1821

## 2023-10-17 NOTE — Consult Note (Addendum)
 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  Stoma type/location: RMQ ileostomy  Stomal assessment/size:  1 1/8 slightly oval last assessment  Peristomal assessment:  skin breakdown and irritation felt to have fungal component  Treatment options for stomal/peristomal skin:   Crust area with antifungal powder and no sting barrier wipes as below; 2 barrier ring  Output  Ostomy pouching: 2 piece 2 1/4 convex pouching system; 2 1/4 convex skin barrier [employer] #[id number], 2 1/4 pouch [employer] #[id number] and 2 barrier ring [employer] [phone]  If stool is liquid can use a high output pouch [employer] [phone] that will hook to foley bedside drainage bag  Education provided:  patient has been educated extensively on multiple previous admissions  Enrolled patient in [employer] DC program: no, Has Medicaid and last assessment was using [facility]    Sprinkle antifungal powder (floor stock microguard green and white label) over any irritated skin, brush away excess powder Tap lightly over the powder with Cavilon No-Sting barrier wipe (skin barrier wipe-white and blue package) Allow to dry Apply another layer of powder following the same steps up to three layers.  After dry proceed with routine pouching    Discussed above with primary MD and ED nurse.    Thank you,    Ronni Colace MSN, RN-BC, [employer]

## 2023-10-17 NOTE — ED Triage Notes (Signed)
 Pt bib [facility] w/ c/o chest pain 5/10, infected colostomy and dysuria. Pt had 4 asp in route. Pt has hx of afib, hypotension, a small MI and angina. Pt is A&OX 4

## 2023-10-17 NOTE — Progress Notes (Signed)
 Called for pt report, ED nurse states she is tied up with critical patient, will call me back.

## 2023-10-17 NOTE — H&P (Addendum)
 History and Physical:    [name]   NWG:[id number] DOB: Aug 02, 1970 DOA: 10/17/2023  Referring MD/provider: Rosealee Concha PCP: Vladimir Groves, PA-C   Patient coming from: Home  Chief Complaint: Irritation and pain around stoma site.  History of Present Illness:   [name] is an 53 y.o. female with a PMH of Crohn's disease status post total colectomy/ostomy, anxiety, asthma, collagen vascular disease, COPD, GERD, hypertension, type 2 diabetes, PTSD and a seizure disorder who presents to the ER today with a chief complaint of abdominal discomfort and pain around ostomy site, irritation of the vaginal tissues, and 1 episode of nausea/vomiting last week.  Patient reports that her appetite has not picked up since that time.  She reports diminished ostomy output as well.  Patient also reported that there was some blood-tinged stools last week but this has cleared.  ED Course:  The patient underwent a workup in the ED.  Vital signs were: Blood pressure (!) 109/55, pulse 79, temperature 98 F (36.7 C), temperature source Oral, resp. rate 20, SpO2 100%.  Chemistries were notable for a sodium of 131, chloride 92, bicarb 21, glucose 101, BUN 37, creatinine 2.50 (baseline creatinine 1.01) and an anion gap of 18.  CBC showed a WBC of 11.0, platelets 480.  Troponin was normal.  LFTs showed an ALT of 47 and an alkaline phosphatase of 176, otherwise normal.  D-dimer was 0.34.  Chest x-ray was negative for acute cardiopulmonary findings.  CT of the abdomen and pelvis showed no acute intra-abdominal or pelvic pathology and no change in the right lower quadrant ostomy.  ROS:   Review of Systems  Constitutional:  Positive for chills. Negative for fever.  HENT: Negative.    Eyes: Negative.   Respiratory: Negative.    Cardiovascular: Negative.   Gastrointestinal:  Positive for abdominal pain, blood in stool, nausea and vomiting.  Genitourinary: Negative.   Musculoskeletal: Negative.    Skin:  Positive for rash.  Neurological: Negative.   Endo/Heme/Allergies: Negative.   Psychiatric/Behavioral: Negative.      Past Medical History:   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   GERD (gastroesophageal reflux disease)    EGD 01/2007 by Dr.Rourke small hiatal hernia s/p 56 french maloney    History of head injury    Hypertension    IBS (irritable bowel syndrome)    PSVT (paroxysmal supraventricular tachycardia) (HCC)    PTSD (post-traumatic stress disorder)    Recurrent chest pain    Seizure disorder (HCC)    Type 2 diabetes mellitus (HCC)     Past Surgical History:   Past Surgical History:  Procedure Laterality Date   ABDOMINAL HYSTERECTOMY     with right salpingo oophorectomy 2005   APPENDECTOMY  2006   BALLOON DILATION N/A 02/19/2021   Procedure: BALLOON DILATION;  Surgeon: Vinetta Greening, DO;  [facility];  Service: Endoscopy;  Laterality: N/A;  Sigmoid colon stricture   BIOPSY  02/06/2021   Procedure: BIOPSY;  Surgeon: Vinetta Greening, DO;  [facility];  Service: Endoscopy;;   BIOPSY  02/19/2021   Procedure: BIOPSY;  Surgeon: Vinetta Greening, DO;  [facility];  Service: Endoscopy;;   BIOPSY  07/15/2021  Procedure: BIOPSY;  Surgeon: Ruby Corporal, MD;  [facility];  Service: Endoscopy;;   BIOPSY  04/08/2023   Procedure: BIOPSY;  Surgeon: Suzette Espy, MD;  [facility];  Service: Endoscopy;;   BIOPSY  06/25/2023   Procedure: BIOPSY;  Surgeon: Daina Drum, MD;  Location: [facility] ENDOSCOPY;  Service: Gastroenterology;;   CESAREAN SECTION     1996   CHOLECYSTECTOMY     COLECTOMY N/A 02/11/2023   Procedure: TOTAL COLECTOMY;  Surgeon: Sim Dryer, MD;  Location: [facility];  Service: General;   Laterality: N/A;   COLONOSCOPY  2010   Dr. Riley Cheadle; negative except for hemorrhoids   COLOSTOMY  02/11/2023   Procedure: COLOSTOMY;  Surgeon: Sim Dryer, MD;  Location: [facility];  Service: General;;   ESOPHAGEAL DILATION N/A 10/25/2014   Procedure: ESOPHAGEAL DILATION;  Surgeon: Ruby Corporal, MD;  [facility];  Service: Endoscopy;  Laterality: N/A;   ESOPHAGOGASTRODUODENOSCOPY N/A 10/25/2014   Procedure: ESOPHAGOGASTRODUODENOSCOPY (EGD);  Surgeon: Ruby Corporal, MD;  [facility];  Service: Endoscopy;  Laterality: N/A;  1250   ESOPHAGOGASTRODUODENOSCOPY (EGD) WITH PROPOFOL  N/A 06/25/2023   Procedure: ESOPHAGOGASTRODUODENOSCOPY (EGD) WITH PROPOFOL ;  Surgeon: Daina Drum, MD;  Location: [facility] ENDOSCOPY;  Service: Gastroenterology;  Laterality: N/A;   FLEXIBLE SIGMOIDOSCOPY N/A 02/06/2021   Procedure: FLEXIBLE SIGMOIDOSCOPY;  Surgeon: Vinetta Greening, DO;  [facility];  Service: Endoscopy;  Laterality: N/A;   FLEXIBLE SIGMOIDOSCOPY N/A 02/19/2021   Procedure: FLEXIBLE SIGMOIDOSCOPY;  Surgeon: Vinetta Greening, DO;  [facility];  Service: Endoscopy;  Laterality: N/A;   FLEXIBLE SIGMOIDOSCOPY N/A 02/02/2023   Procedure: FLEXIBLE SIGMOIDOSCOPY;  Surgeon: Nannette Babe, MD;  Location[facility]COPY;  Service: Gastroenterology;  Laterality: N/A;   FLEXIBLE SIGMOIDOSCOPY N/A 02/10/2023   Procedure: FLEXIBLE SIGMOIDOSCOPY;  Surgeon: Albertina Hugger, MD;  Location: [facility] ENDOSCOPY;  Service: Gastroenterology;  Laterality: N/A;   FLEXIBLE SIGMOIDOSCOPY N/A 04/08/2023   Procedure: FLEXIBLE SIGMOIDOSCOPY;  Surgeon: Suzette Espy, MD;  [facility];  Service: Endoscopy;  Laterality: N/A;   IR FLUORO GUIDE CV LINE LEFT  02/21/2023   IR US  GUIDE VASC ACCESS LEFT  02/21/2023   LAPAROTOMY N/A 02/11/2023   Procedure: EXPLORATORY LAPAROTOMY;  Surgeon: Sim Dryer, MD;  [facility]: [facility];  Service: General;  Laterality: N/A;   OOPHORECTOMY     left  for torsion and ovarian fibroma; uterine myoma resected 1995   SAVORY DILATION N/A 06/25/2023   Procedure: SAVORY DILATION;  Surgeon: Daina Drum, MD;  Location: [facility] ENDOSCOPY;  Service: Gastroenterology;  Laterality: N/A;   SIGMOIDOSCOPY  07/15/2021   Procedure: SIGMOIDOSCOPY;  Surgeon: Ruby Corporal, MD;  [facility];  Service: Endoscopy;;   TRANSESOPHAGEAL ECHOCARDIOGRAM (CATH LAB) N/A 02/18/2023   Procedure: TRANSESOPHAGEAL ECHOCARDIOGRAM;  Surgeon: Harrold Lincoln, MD;  Location: [facility] INVASIVE CV LAB;  Service: Cardiovascular;  Laterality: N/A;    Social History:   Social History   Socioeconomic History   Marital status: Divorced    Spouse name: Not on file   Number of children: 1   Years of education: Not on file   Highest education level: Not on file  Occupational History   Not on file  Tobacco Use   Smoking status: Every Day    Current packs/day: 0.50    Average packs/day: 0.5 packs/day for 39.1 years (19.5 ttl pk-yrs)    Types: Cigarettes    Start date:  09/26/1984    Passive exposure: Current   Smokeless tobacco: Never  Vaping Use   Vaping status: Never Used  Substance and Sexual Activity   Alcohol  use: No    Alcohol /week: 0.0 standard drinks of alcohol    Drug use: Yes    Types: Marijuana   Sexual activity: Yes    Birth control/protection: Surgical  Other Topics Concern   Not on file  Social History Narrative   Not on file   Social Drivers of Health   Financial Resource Strain: Medium Risk (06/09/2023)   Received [facility]   Overall Financial Resource Strain (CARDIA)    Difficulty of Paying Living Expenses: Somewhat hard  Food Insecurity: Food Insecurity Present (06/20/2023)   Hunger Vital Sign    Worried About Running Out of Food in the Last Year: Sometimes true    Ran Out of Food in the Last Year: Sometimes true  Transportation Needs: Unmet Transportation Needs (06/20/2023)   PRAPARE - Administrator, Civil Service  (Medical): Yes    Lack of Transportation (Non-Medical): Yes  Physical Activity: Inactive (06/09/2023)   Received from [facility]   Exercise Vital Sign    On average, how many days per week do you engage in moderate to strenuous exercise (like a brisk walk)?: 0 days    On average, how many minutes do you engage in exercise at this level?: 0 min  Stress: Stress Concern Present (06/09/2023)   Received from [facility] - Occupational Stress Questionnaire    Feeling of Stress : To some extent  Social Connections: Socially Isolated (06/09/2023)   Received from [facility]   Social Connection and Isolation Panel    In a typical week, how many times do you talk on the phone with family, friends, or neighbors?: Twice a week    How often do you get together with friends or relatives?: Twice a week    How often do you attend church or religious services?: Never    Do you belong to any clubs or organizations such as church groups, unions, fraternal or athletic groups, or school groups?: No    How often do you attend meetings of the clubs or organizations you belong to?: Never    Are you married, widowed, divorced, separated, never married, or living with a partner?: Divorced  Intimate Partner Violence: Not At Risk (06/20/2023)   Humiliation, Afraid, Rape, and Kick questionnaire    Fear of Current or Ex-Partner: No    Emotionally Abused: No    Physically Abused: No    Sexually Abused: No    Allergies   Codeine, Hydrocodone , Ketorolac , Morphine and codeine, Penicillins, Nickel, Pregabalin , Acetaminophen , Atarax  [hydroxyzine ], Ativan  [lorazepam ], Oxycodone -acetaminophen , Strawberry extract, Watermelon concentrate, Albuterol , Benadryl  [diphenhydramine  hcl], Humira  [adalimumab ], Latex, Remicade  [infliximab ], and Tramadol   Family history:   Family History  Problem Relation Age of Onset   Cancer Mother    Heart failure Mother    Hypertension Mother     Heart attack Mother    Heart attack Father 42   Cancer Father 54   Heart failure Father    Diabetes Father    Crohn's disease Cousin     Current Medications:   Prior to Admission medications   Medication Sig Start Date End Date Taking? Authorizing Provider  hydrocortisone  (CORTENEMA ) 100 MG/60ML enema Place 1 enema (100 mg total) rectally at bedtime for 21 days. 10/03/23 10/24/23  Carlan, Fidel Huddle,  NP  albuterol  (ACCUNEB ) 1.25 MG/3ML nebulizer solution Take 1 ampule by nebulization every 6 (six) hours as needed for wheezing or shortness of breath. 04/21/23   [provider]  ALPRAZolam  (XANAX ) 1 MG tablet Take 0.5 tablets (0.5 mg total) by mouth 2 (two) times daily as needed for anxiety. 06/28/23   Elgergawy, Ardia Kraft, MD  amitriptyline  (ELAVIL ) 75 MG tablet Take 75 mg by mouth at bedtime.    [provider]  apixaban  (ELIQUIS ) 2.5 MG TABS tablet Take 1 tablet (2.5 mg total) by mouth 2 (two) times daily. Patient not taking: Reported on 09/12/2023 04/25/23   Rayfield Cairo, MD  cetirizine (ZYRTEC) 10 MG tablet Take 10 mg by mouth daily. 01/08/22   [provider]  cyanocobalamin  (VITAMIN B12) 1000 MCG/ML injection Take one injection subcutaneously weekly for 4 weeks, then once monthly Patient not taking: Reported on 09/12/2023 06/28/23   Elgergawy, Ardia Kraft, MD  cyclobenzaprine  (FLEXERIL ) 10 MG tablet Take 10 mg by mouth 3 (three) times daily as needed for muscle spasms. 06/10/23   [provider]  feeding supplement (ENSURE ENLIVE / ENSURE PLUS) LIQD Take 237 mLs by mouth 2 (two) times daily between meals. 01/04/22   Justina Oman, MD  folic acid  (FOLVITE ) 1 MG tablet Take 1 tablet (1 mg total) by mouth daily. 06/28/23   Elgergawy, Ardia Kraft, MD  gabapentin  (NEURONTIN ) 300 MG capsule Take 300 mg by mouth 2 (two) times daily. 10/15/22   [provider]  levETIRAcetam  (KEPPRA ) 750 MG tablet Take 750 mg by mouth 2 (two) times daily. 09/30/20   [provider]  loperamide  (IMODIUM ) 2 MG capsule Take 1 capsule (2 mg total) by mouth every 6 (six) hours. 04/25/23   Johnson, Clanford L, MD  metoprolol  tartrate (LOPRESSOR ) 25 MG tablet Take 0.5 tablets (12.5 mg total) by mouth 2 (two) times daily. 04/10/23   Johnson, Clanford L, MD  midodrine  (PROAMATINE ) 10 MG tablet Take 1 tablet (10 mg total) by mouth 3 (three) times daily with meals. Patient not taking: Reported on 09/12/2023 06/28/23   Elgergawy, Ardia Kraft, MD  nitroGLYCERIN  (NITROSTAT ) 0.4 MG SL tablet Place 1 tablet (0.4 mg total) under the tongue every 5 (five) minutes as needed for chest pain. 04/25/23   Johnson, Clanford L, MD  nystatin  (MYCOSTATIN /NYSTOP ) powder Apply topically 3 (three) times daily. 05/12/23   Mason Sole, Pratik D, DO  ondansetron  (ZOFRAN ) 4 MG tablet Take 1 tablet (4 mg total) by mouth every 6 (six) hours as needed for nausea. Patient not taking: Reported on 09/12/2023 05/12/23   Doreene Gammon D, DO  pantoprazole  (PROTONIX ) 40 MG tablet Take 1 tablet (40 mg total) by mouth 2 (two) times daily. 09/12/23   Carlan, Chelsea L, NP  scopolamine (TRANSDERM-SCOP) 1 MG/3DAYS Place 1 patch onto the skin every 3 (three) days. As needed    [provider]  SYRINGE-NEEDLE, DISP, 3 ML (B-D 3CC LUER-LOK SYR 25GX1) 25G X 1 3 ML MISC Use as directed with B12 injection 06/28/23   Elgergawy, Ardia Kraft, MD  thiamine  (VITAMIN B1) 100 MG tablet Take 1 tablet (100 mg total) by mouth daily. Patient not taking: Reported on 09/12/2023 06/28/23   Elgergawy, Ardia Kraft, MD    Physical Exam:   Vitals:   10/17/23 1345 10/17/23 1400 10/17/23 1415 10/17/23 1530  BP: 97/62 114/70 100/82 (!) 109/55  Pulse: 81 80 83 79  Resp: 16 (!) 25 20   Temp:      TempSrc:  SpO2: 99% 98% 99% 100%     Physical Exam: Blood pressure (!) 109/55, pulse 79, temperature 98 F (36.7 C), temperature source Oral, resp. rate 20, SpO2 100%. Gen: No acute distress. Head: Normocephalic, atraumatic. Eyes: Pupils equal,  round and reactive to light. Extraocular movements intact.  Sclerae nonicteric. No lid lag. Mouth: Oropharynx reveals dry mucous membranes. Dentition is fair, edentulous upper palate. Neck: Supple, no thyromegaly, no lymphadenopathy, no jugular venous distention. Chest: Lungs are clear to auscultation with good air movement. No rales, rhonchi or wheezes.  CV: Heart sounds are regular with an S1, S2. No murmurs, rubs, clicks, or gallops.  Abdomen: Soft, tender to light palpation.  Ostomy right lower quadrant draining tan-colored liquid stool.  Stoma site erythematous and irritated appearing. Extremities: Extremities are without clubbing, or cyanosis. No edema. Pedal pulses 2+.  Skin: Warm and dry.  Erythema noted to labia majora and inner thighs consistent with fungal yeast cellulitis. Neuro: Alert and oriented times 3; grossly nonfocal.  Psych: Insight is good and judgment is appropriate. Mood and affect normal.   Data Review:    Labs: Basic Metabolic Panel: Recent Labs  Lab 10/17/23 1208  NA 131*  K 4.0  CL 92*  CO2 21*  GLUCOSE 101*  BUN 37*  CREATININE 2.50*  CALCIUM  9.5   Liver Function Tests: Recent Labs  Lab 10/17/23 1208  AST 36  ALT 47*  ALKPHOS 176*  BILITOT 0.8  PROT 8.3*  ALBUMIN  4.1   CBC: Recent Labs  Lab 10/17/23 1208  WBC 11.0*  HGB 13.9  HCT 40.2  MCV 86.3  PLT 480*   Radiographic Studies: CT ABDOMEN PELVIS WO CONTRAST Result Date: 10/17/2023 CLINICAL DATA:  Abdominal pain nonlocalized EXAM: CT ABDOMEN AND PELVIS WITHOUT CONTRAST TECHNIQUE: Multidetector CT imaging of the abdomen and pelvis was performed following the standard protocol without IV contrast. RADIATION DOSE REDUCTION: This exam was performed according to the departmental dose-optimization program which includes automated exposure control, adjustment of the mA and/or kV according to patient size and/or use of iterative reconstruction technique. COMPARISON:  June 19 2023 FINDINGS:  Lower chest: Comparison with prior examination accounting for differences in techniques there is no significant change in the lobular density along the anterior margin of the right lower lobe measuring 10 mm in maximum diameter. Although is better defined on today's examination than on prior examination. Follow-up CT chest is recommended for better characterization of these nodular density and close interval follow-up 6-12 months is recommended Lung-RADS 3, probably benign findings. Short-term follow-up in 6 months is recommended with repeat low-dose chest CT without contrast (please use the following order, CT CHEST LCS NODULE FOLLOW-UP W/O CM). Hepatobiliary: No focal liver abnormality is seen. Status post cholecystectomy. No biliary dilatation. Pancreas: Pancreas normal size. No masses calcifications or inflammatory changes. Spleen: Spleen normal size.  No masses. Adrenals/Urinary Tract: Adrenal glands are normal size. Follow-up recommended. Kidneys are normal. No masses calcifications or hydronephrosis Stomach/Bowel: No small or large bowel obstruction or inflammatory changes. Moderate amount of residual fecal material throughout the colon without obstruction or constipation. No change in the right lower quadrant ileostomy or colostomy correlate clinically. Vascular/Lymphatic: No significant vascular findings are present. No enlarged abdominal or pelvic lymph nodes. Reproductive: .  No masses. Bladder unremarkable. Other: Anterior abdominal wall unremarkable without evidence of umbilical or inguinal hernias Musculoskeletal: Visualized portion of the thoracolumbar spine and pelvic structures grossly unremarkable without evidence of fracture bony abnormalities or soft tissue masses. IMPRESSION: *No acute intra-abdominal or  pelvic pathology. *No change in the right lower quadrant ileostomy or colostomy correlate clinically. *Moderate amount of residual fecal material throughout the colon without obstruction or  constipation. *Status post cholecystectomy. *No change in the lobular density along the anterior margin of the right lower lobe measuring 10 mm in maximum diameter. Although is better defined on today's examination than on prior examination. Follow-up CT chest is recommended for better characterization of these nodular density and close interval follow-up 6-12 months is recommended. Electronically Signed   By: Fredrich Jefferson M.D.   On: 10/17/2023 15:15   DG Chest Port 1 View Result Date: 10/17/2023 CLINICAL DATA:  Chest pain EXAM: PORTABLE CHEST 1 VIEW COMPARISON:  X-ray 06/19/2023 FINDINGS: No consolidation, pneumothorax or effusion. No edema. Normal cardiopericardial silhouette. Overlapping cardiac leads. IMPRESSION: No acute cardiopulmonary disease. Electronically Signed   By: Adrianna Horde M.D.   On: 10/17/2023 12:36   Assessment/Plan:   Principal Problem:   Cellulitis Suspect fungal cellulitis involving the stoma and skin of the labia majora and inner thighs.  Given the degree of her discomfort, we will place her on IV Diflucan for now.  The ostomy nurses know her well and have recommended the following treatment topically: crusting the area with antifungal powder and no sting barrier wipes.   Active Problems:   History of seizure Continue Keppra .    Polypharmacy/anxiety/MDD/PTSD Patient appears to be taking multiple sedating medications for unclear indications.  I am discontinuing her Halcion , Atarax , Flexeril .  Change Xanax  to as needed.  She continues to take Neurontin .    History of thyrotoxicosis Continue metoprolol .  Heart rate 79.    Crohn's disease with complication (HCC) status post total colectomy Discontinue Imodium  for now.  Liquid tan-colored stools in her ostomy pouch.  Appears to have been on hydrocortisone  enemas but will hold this for now.    Acute kidney injury (HCC) Hydrate and monitor.    Type 2 diabetes Beta hydroxybutyric acid was not elevated to suggest DKA.   Random blood sugar was 128.  Appears to be diet controlled.  Her last A1c was 5.8% 01/27/2023.    Anxiety Continue Xanax  but changed to as needed.  Continue amitriptyline .  Would discontinue Atarax  for now.  Discontinue Halcion  for now.   HIV screening The patient falls between the ages of 13-64 and should be screened for HIV, therefore HIV testing ordered.  There is no height or weight on file to calculate BMI.  Other information:   DVT prophylaxis: Lovenox  ordered. Code Status: Full code. Family Communication: Boyfriend at the bedside. Disposition Plan: Home when stable. Consults called: None. Admission status: Observation.  The medical decision making on this patient was of high complexity and the patient is at high risk for clinical deterioration, therefore this is a level 3 visit.   Craige Dixon Leeman Johnsey [facility] Pager [phone]   How to contact the [facility] Attending or Consulting provider 7A - 7P or covering provider during after hours 7P -7A, for this patient?   Check the care team in [facility] and look for a) attending/consulting [facility] provider listed and b) the [facility] team listed Log into [email or web address] and use [place]'s universal password to access. If you do not have the password, please contact the hospital operator. Locate the [facility] provider you are looking for under [facility] and page to a number that you can be directly reached. If you still have difficulty reaching the provider, please page the [facility] (Director on Call) for the Hospitalists listed on  [email or web address] for assistance.  10/17/2023, 5:00 PM

## 2023-10-18 ENCOUNTER — Inpatient Hospital Stay (HOSPITAL_COMMUNITY)

## 2023-10-18 ENCOUNTER — Ambulatory Visit (INDEPENDENT_AMBULATORY_CARE_PROVIDER_SITE_OTHER): Admitting: Gastroenterology

## 2023-10-18 DIAGNOSIS — N179 Acute kidney failure, unspecified: Secondary | ICD-10-CM | POA: Diagnosis not present

## 2023-10-18 DIAGNOSIS — L0390 Cellulitis, unspecified: Secondary | ICD-10-CM | POA: Diagnosis not present

## 2023-10-18 LAB — HEPATIC FUNCTION PANEL
ALT: 31 U/L (ref 0–44)
AST: 23 U/L (ref 15–41)
Albumin: 3 g/dL — ABNORMAL LOW (ref 3.5–5.0)
Alkaline Phosphatase: 126 U/L (ref 38–126)
Bilirubin, Direct: 0.1 mg/dL (ref 0.0–0.2)
Indirect Bilirubin: 0.5 mg/dL (ref 0.3–0.9)
Total Bilirubin: 0.6 mg/dL (ref 0.0–1.2)
Total Protein: 5.9 g/dL — ABNORMAL LOW (ref 6.5–8.1)

## 2023-10-18 LAB — GLUCOSE, CAPILLARY
Glucose-Capillary: 95 mg/dL (ref 70–99)
Glucose-Capillary: 96 mg/dL (ref 70–99)
Glucose-Capillary: 119 mg/dL — ABNORMAL HIGH (ref 70–99)
Glucose-Capillary: 115 mg/dL — ABNORMAL HIGH (ref 70–99)

## 2023-10-18 LAB — CBC
HCT: 30.5 % — ABNORMAL LOW (ref 36.0–46.0)
Hemoglobin: 10.6 g/dL — ABNORMAL LOW (ref 12.0–15.0)
MCH: 30.3 pg (ref 26.0–34.0)
MCHC: 34.8 g/dL (ref 30.0–36.0)
MCV: 87.1 fL (ref 80.0–100.0)
Platelets: 394 10*3/uL (ref 150–400)
RBC: 3.5 MIL/uL — ABNORMAL LOW (ref 3.87–5.11)
RDW: 12.8 % (ref 11.5–15.5)
WBC: 8.6 10*3/uL (ref 4.0–10.5)
nRBC: 0 % (ref 0.0–0.2)

## 2023-10-18 LAB — BASIC METABOLIC PANEL WITH GFR
Anion gap: 13 (ref 5–15)
BUN: 32 mg/dL — ABNORMAL HIGH (ref 6–20)
CO2: 26 mmol/L (ref 22–32)
Calcium: 8.4 mg/dL — ABNORMAL LOW (ref 8.9–10.3)
Chloride: 97 mmol/L — ABNORMAL LOW (ref 98–111)
Creatinine, Ser: 1.75 mg/dL — ABNORMAL HIGH (ref 0.44–1.00)
GFR, Estimated: 34 mL/min — ABNORMAL LOW (ref >60)
Glucose, Bld: 89 mg/dL (ref 70–99)
Potassium: 3.3 mmol/L — ABNORMAL LOW (ref 3.5–5.1)
Sodium: 136 mmol/L (ref 135–145)

## 2023-10-18 LAB — TSH: TSH: 1.109 u[IU]/mL (ref 0.350–4.500)

## 2023-10-18 LAB — FOLATE: Folate: >40 ng/mL (ref >5.9)

## 2023-10-18 LAB — HEMOGLOBIN A1C
Hgb A1c MFr Bld: 5.8 % — ABNORMAL HIGH (ref 4.8–5.6)
Mean Plasma Glucose: 119.76 mg/dL

## 2023-10-18 LAB — VITAMIN B12: Vitamin B-12: 272 pg/mL (ref 180–914)

## 2023-10-18 LAB — PHOSPHORUS: Phosphorus: 4.4 mg/dL (ref 2.5–4.6)

## 2023-10-18 LAB — MAGNESIUM: Magnesium: 1.3 mg/dL — ABNORMAL LOW (ref 1.7–2.4)

## 2023-10-18 MED ORDER — ACETAMINOPHEN 500 MG PO TABS
1000.0000 mg | ORAL_TABLET | Freq: Three times a day (TID) | ORAL | Status: DC
  Administered 2023-10-18 – 2023-10-20 (×7): 1000 mg via ORAL
  Filled 2023-10-18 (×7): qty 2

## 2023-10-18 NOTE — Plan of Care (Signed)

## 2023-10-18 NOTE — TOC Initial Note (Signed)
 Transition of Care [facility]) - Initial/Assessment Note    Patient Details  Name: [name] MRN: [id number] Date of Birth: April 20, 1971  Transition of Care [facility]) CM/SW Contact:    Ander Katos, LCSW Phone Number: 10/18/2023, 8:26 AM  Clinical Narrative: Pt admitted with cellulitis. Assessment completed due to high risk readmission score. Pt reports she is living with a friend. She is independent with ADLs and no current home health services. Pt indicates she has transportation to appointments. She plans to return home when medically stable. TOC will continue to follow.                  Expected Discharge Plan: Home/Self Care Barriers to Discharge: Continued Medical Work up   Patient Goals and CMS Choice Patient states their goals for this hospitalization and ongoing recovery are:: return home   Choice offered to / list presented to : Patient [place] ownership interest in [facility].provided to::  (n/a)    Expected Discharge Plan and Services In-house Referral: Clinical Social Work     Living arrangements for the past 2 months: Single Family Home                                      Prior Living Arrangements/Services Living arrangements for the past 2 months: Single Family Home Lives with:: Friends Patient language and need for interpreter reviewed:: Yes Do you feel safe going back to the place where you live?: Yes      Need for Family Participation in Patient Care: No (Comment)   Current home services: DME (cane) Criminal Activity/Legal Involvement Pertinent to Current Situation/Hospitalization: No - Comment as needed  Activities of Daily Living   ADL Screening (condition at time of admission) Independently performs ADLs?: Yes (appropriate for developmental age) Is the patient deaf or have difficulty hearing?: No Does the patient have difficulty seeing, even when wearing glasses/contacts?: No Does the patient have difficulty  concentrating, remembering, or making decisions?: No  Permission Sought/Granted                  Emotional Assessment     Affect (typically observed): Appropriate Orientation: : Oriented to Self, Oriented to Place, Oriented to  Time, Oriented to Situation Alcohol  / Substance Use: Not Applicable Psych Involvement: No (comment)  Admission diagnosis:  Generalized abdominal pain [R10.84] Atypical chest pain [R07.89] AKI (acute kidney injury) (HCC) [N17.9] Acute kidney injury (HCC) [N17.9] Patient Active Problem List   Diagnosis Date Noted   Cellulitis 10/17/2023   History of pulmonary embolus (PE) 10/17/2023   Dysphagia 06/23/2023   H/O ileostomy 06/23/2023   Benzodiazepine (tranquilizer) overdose 06/21/2023   Hypomagnesemia 04/07/2023   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   N&V (nausea and vomiting) 04/30/2021   Bloating    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HCC)    Metabolic encephalopathy  01/20/2021   Moderate Pericardial effusion 01/19/2021   Lactic acidosis 01/16/2021   Seizure (HCC) 06/24/2020   MDD (major depressive disorder) 03/24/2020   Chronic patellofemoral pain of both knees 01/11/2020   Essential hypertension 06/17/2018   Insomnia 12/13/2017   Neuropathy 12/13/2017   COPD (chronic obstructive pulmonary disease) (HCC)    Crohn's disease with complication (HCC)     Diabetes mellitus without complication (HCC)    Anxiety    Cigarette smoker    PTSD (post-traumatic stress disorder)    Palpitation    Constipation 09/20/2012   Head injury 05/09/2012   Hyperlipidemia 04/11/2012   DJD (degenerative joint disease), lumbar 04/04/2012   Paroxysmal SVT (supraventricular tachycardia) (HCC) 04/04/2012   Tobacco dependence 04/04/2012   Paresthesia 11/25/2011   Abdominal pain, chronic, epigastric 11/01/2011   GERD (gastroesophageal reflux disease)    Hyperglycemia    Chronic low back pain    IBS (irritable bowel syndrome)    Closed head injury    Palpitations 07/13/2010   Pleuritic chest pain 07/13/2010   BACK PAIN, LUMBAR, CHRONIC 07/03/2008   PCP:  Vladimir Groves, PA-C Pharmacy:   [facility] [place], [place] - [address] [place] [address] Phone: [phone] Fax: [phone]  [facility] [address] [place] - [address] [place] [address] Phone: [phone] Fax: [phone]  [facility] [address] [place] - [address] [place] [address] [place] [address] [place] [address] Phone: [phone] Fax: [phone]  [facility] [address] [place] [address] Phone: [phone] Fax: [phone]  [facility] [address] [place] [address] Phone: [phone] Fax: [phone]  [id number] - [place], [place] - [address] [facility] [address] [place] [address] Phone: [phone] Fax: [phone]     Social Drivers of Health (SDOH) Social History: SDOH Screenings   Food Insecurity: No Food Insecurity (10/17/2023)  Housing: High Risk (10/17/2023)  Transportation Needs: Unmet Transportation Needs (10/17/2023)  Utilities: At Risk (10/17/2023)  Financial Resource Strain: Medium Risk (06/09/2023)   Received from [facility]  Physical Activity: Inactive  (06/09/2023)   Received from [facility]  Social Connections: Socially Isolated (06/09/2023)   [facility] from [facility]  Stress: Stress Concern Present (06/09/2023)   Received from [facility]  Tobacco Use: High Risk (10/17/2023)  Health Literacy: Medium Risk (06/09/2023)   Received from [facility]   SDOH Interventions: Food Insecurity Interventions: Inpatient TOC, Other (Comment) (No concerns reported.) Transportation Interventions: Inpatient TOC, Other (Comment) (Pt reports she has transportation.) Utilities Interventions: Other (Comment), Inpatient TOC (Pt is living with friend and working on utility issue at home.)   Readmission Risk Interventions    10/18/2023    8:25 AM 06/21/2023    2:07 PM 05/12/2023   11:42 AM  Readmission Risk Prevention Plan  Transportation Screening Complete Complete Complete  Medication Review (RN Care Manager) Complete Referral to Pharmacy   PCP or Specialist appointment within 3-5 days of discharge  Complete Complete  HRI or Home Care Consult Complete Complete Complete  SW Recovery Care/Counseling Consult  Complete Complete  Palliative Care Screening Not Applicable Not Applicable Not Applicable  Skilled Nursing Facility Not Applicable  Not Applicable

## 2023-10-18 NOTE — Progress Notes (Signed)
 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  She reported 1 episode of nausea and vomiting in the past week. As typical, the patient has numerous concerns.  She complains of having  numbness all over, particularly numbness in her bilateral arms and legs.  She denies any focal extremity weakness.  She denies any dysarthria or vision loss.  She denies any headache.  She denies any fevers or chills.  She continues to have a large amount of ostomy outpt.  Oral intake has not been great.  She complains of dizziness when she stands with near syncope.  She denies any chest pain, shortness of breath.  She complains of her usual abdominal pain.  She has had many hospitalizations, Most recently, she was admitted to [facility] from 06/19/2023 to 06/28/2023 when she was treated for hypotension and treated for hypovolemic shock.  Septic shock was ruled out.  During the hospitalization, the patient presented with hypotension and hypothermia and required Levophed .  Her home midodrine  was restarted and blood pressure eventually improved.  She complained of dysphagia and underwent endoscopy on 06/25/2023 which showed normal esophagus which was dilated, gastric and duodenal erosions with stigmata of recent bleed.  GI recommended PPI twice daily for 8 weeks.  Her Eliquis  was resumed at the time of discharge.  She also had toxic encephalopathy secondary to benzodiazepine and opioid overuse.  05/10/23 to 05/12/23 admission-- Treated for AKI secondary to dehydration from GI losses secondary to high output ostomy  04/21/23  to 04/25/23 admission-- Treated for acute kidney injury related to 5 depletion from high ostomy output She also had proctitis for which she was prescribed mesalamine  enemas   hospitalized from 04/03/2023 to 04/10/2023 for septic shock related to her stoma site infection and prior candidemia.  The patient was discharged home with 5 more doses of doxycycline  and cefadroxil .  She was also sent home with Cresemba  to finish on 04/14/2023.  GI was also consulted secondary to rectal bleeding.  Flex sig was performed on 04/14/2023 which showed oral rectal mucosa with spontaneous oozing and touch friability.  the patient was discharged home with instructions for nightly steroid enemas for 4 weeks.. Notably, the patient had a prolonged hospitalization from 01/27/2023 to 03/09/2023 for fulminant close treatment of his colitis with perforation and pelvic abscesses requiring colectomy on 02/11/2023.  That hospitalization was prolonged secondary to Candida glabrata fungemia.  Her blood cultures remain negative at the time of discharge.   Assessment/Plan:  Cellulitis abdominal wall (ostomy site) -start vanc and ceftriaxone  -start vancomycin  po for secondary C diff prophylaxis -appreciate wound care consult  Acute kidney injury -Secondary to volume depletion -Baseline creatinine 0.9-1.2 -Continue IV fluids   Chronic abdominal pain/opioid dependence -PDMP reviewed -Halcion  0.25 mg, #30 filled 10/05/23 -Xanax  1 mg, #90, refill 10/07/23 -Dilaudid  2 mg, #10, filled 08/22/23 -Judicious opioids - Continue gabapentin   Sensory disturbance -Patient complains of numbness all over, particularly bilateral arms and legs -Check B12 -Check folate -Check TSH -RPR -MRI brain   Hyponatremia -Secondary to volume depletion -continue isotonic saline>>improved  Crohn's disease with complication (HCC) status post total colectomy  -Patient had colectomy 02/11/2023 secondary to  fulminant C. Difficile -Consult ostomy  nurse>>appreciate input   High Output ostomy - Holding loperamide  temporarily  Seizure disorder -Continue Keppra    Anxiety -Continue home dose alprazolam  - Holding Halcion   Right upper extremity DVT: Diagnosed October 2024 secondary to PICC with DVT located in the right internal jugular vein and right brachial veins.  Polypharmacy/anxiety/MDD/PTSD -taking multiple sedating medications for unclear indications.  -discontinuing her Halcion , Atarax , Flexeril .   -Change Xanax  to as needed.   History of cardiac arrest -01/2023 -Remain on telemetry       Family Communication:  no Family at bedside  Consultants:  none  Code Status:  FULL   DVT Prophylaxis:   Lovenox    Procedures: As Listed in Progress Note Above  Antibiotics: Vancomycin  IV 6/17>> Ceftriaxone  6/17>> Vancomycin  PO 6/17>> Fluconazole 6/16>>     Subjective: Patient complains of abdominal pain.  She complains of numbness all over.  She denies any focal extremity weakness.  She denies any headache, visual disturbance, or dysarthria.  she denies any fevers, chills chest pain, shortness of breath  Objective: Vitals:   10/17/23 2116 10/17/23 2213 10/18/23 0212 10/18/23 0517  BP: (!) 96/49 (!) 104/46 (!) 94/54 (!) 93/53  Pulse: 93 90 92 96  Resp: 18 19 19 17   Temp:  (!) 97.5 F (36.4 C) 97.7 F (36.5 C) 97.6 F (36.4 C)  TempSrc:  Oral Oral Oral  SpO2: 98% 97% 97% 97%  Weight:      Height:        Intake/Output Summary (Last 24 hours) at 10/18/2023 1220 Last data filed at 10/18/2023 0551 Gross per 24 hour  Intake --  Output 200 ml  Net -200 ml   Weight change:  Exam:  General:  Pt is alert, follows commands appropriately, not in acute distress HEENT: No icterus, No thrush, No neck mass, [place]/AT Cardiovascular: RRR, S1/S2, no rubs, no gallops Respiratory: CTA bilaterally, no wheezing, no crackles, no rhonchi Abdomen: Soft/+BS, mild diffuse tender, non distended, no guarding Extremities: No  edema, No lymphangitis, No petechiae, No rashes, no synovitis   Data Reviewed: I have personally reviewed following labs and imaging studies Basic Metabolic Panel: Recent Labs  Lab 10/17/23 1208 10/18/23 0433  NA 131* 136  K 4.0 3.3*  CL 92* 97*  CO2 21* 26  GLUCOSE 101* 89  BUN 37* 32*  CREATININE 2.50* 1.75*  CALCIUM  9.5 8.4*  MG  --  1.3*  PHOS  --  4.4   Liver Function Tests: Recent Labs  Lab 10/17/23 1208 10/18/23 0433  AST 36 23  ALT 47* 31  ALKPHOS 176* 126  BILITOT 0.8 0.6  PROT 8.3* 5.9*  ALBUMIN  4.1 3.0*   No results for input(s): LIPASE, AMYLASE in the last 168 hours. No results for input(s): AMMONIA in the last 168 hours. Coagulation Profile: No results for input(s): INR, PROTIME in the last 168 hours. CBC: Recent Labs  Lab 10/17/23 1208 10/18/23 0433  WBC 11.0* 8.6  HGB 13.9 10.6*  HCT 40.2 30.5*  MCV 86.3 87.1  PLT 480* 394   Cardiac Enzymes: No results for input(s): CKTOTAL, CKMB, CKMBINDEX, TROPONINI in the last 168 hours. BNP: Invalid input(s): POCBNP CBG: Recent Labs  Lab 10/17/23 2036 10/18/23 0747 10/18/23 1137  GLUCAP 128* 95 96   HbA1C: Recent Labs    10/17/23 1208  HGBA1C 5.8*   Urine analysis:    Component Value Date/Time   COLORURINE YELLOW 10/17/2023 2005   APPEARANCEUR CLEAR 10/17/2023 2005   LABSPEC 1.010  10/17/2023 2005   PHURINE 5.0 10/17/2023 2005   GLUCOSEU NEGATIVE 10/17/2023 2005   GLUCOSEU NEG mg/dL 13/12/6576 4696   HGBUR NEGATIVE 10/17/2023 2005   BILIRUBINUR NEGATIVE 10/17/2023 2005   KETONESUR NEGATIVE 10/17/2023 2005   PROTEINUR NEGATIVE 10/17/2023 2005   UROBILINOGEN 0.2 12/26/2013 1530   NITRITE NEGATIVE 10/17/2023 2005   LEUKOCYTESUR MODERATE (A) 10/17/2023 2005   Sepsis Labs: @LABRCNTIP (procalcitonin:4,lacticidven:4) )No results found for this or any previous visit (from the past 240 hours).   Scheduled Meds:  acetaminophen   1,000 mg Oral Q8H   amitriptyline   75 mg  Oral QHS   enoxaparin  (LOVENOX ) injection  30 mg Subcutaneous Q24H   feeding supplement  237 mL Oral BID BM   folic acid   1 mg Oral Daily   gabapentin   300 mg Oral BID   insulin  aspart  0-5 Units Subcutaneous QHS   insulin  aspart  0-9 Units Subcutaneous TID WC   levETIRAcetam   750 mg Oral BID   loratadine   10 mg Oral Daily   metoprolol  tartrate  12.5 mg Oral BID   nystatin  cream   Topical BID   pantoprazole   40 mg Oral BID   Continuous Infusions:  fluconazole (DIFLUCAN) IV     lactated ringers  250 mL/hr at 10/17/23 2238    Procedures/Studies: CT ABDOMEN PELVIS WO CONTRAST Result Date: 10/17/2023 CLINICAL DATA:  Abdominal pain nonlocalized EXAM: CT ABDOMEN AND PELVIS WITHOUT CONTRAST TECHNIQUE: Multidetector CT imaging of the abdomen and pelvis was performed following the standard protocol without IV contrast. RADIATION DOSE REDUCTION: This exam was performed according to the departmental dose-optimization program which includes automated exposure control, adjustment of the mA and/or kV according to patient size and/or use of iterative reconstruction technique. COMPARISON:  June 19 2023 FINDINGS: Lower chest: Comparison with prior examination accounting for differences in techniques there is no significant change in the lobular density along the anterior margin of the right lower lobe measuring 10 mm in maximum diameter. Although is better defined on today's examination than on prior examination. Follow-up CT chest is recommended for better characterization of these nodular density and close interval follow-up 6-12 months is recommended Lung-RADS 3, probably benign findings. Short-term follow-up in 6 months is recommended with repeat low-dose chest CT without contrast (please use the following order, CT CHEST LCS NODULE FOLLOW-UP W/O CM). Hepatobiliary: No focal liver abnormality is seen. Status post cholecystectomy. No biliary dilatation. Pancreas: Pancreas normal size. No masses  calcifications or inflammatory changes. Spleen: Spleen normal size.  No masses. Adrenals/Urinary Tract: Adrenal glands are normal size. Follow-up recommended. Kidneys are normal. No masses calcifications or hydronephrosis Stomach/Bowel: No small or large bowel obstruction or inflammatory changes. Moderate amount of residual fecal material throughout the colon without obstruction or constipation. No change in the right lower quadrant ileostomy or colostomy correlate clinically. Vascular/Lymphatic: No significant vascular findings are present. No enlarged abdominal or pelvic lymph nodes. Reproductive: .  No masses. Bladder unremarkable. Other: Anterior abdominal wall unremarkable without evidence of umbilical or inguinal hernias Musculoskeletal: Visualized portion of the thoracolumbar spine and pelvic structures grossly unremarkable without evidence of fracture bony abnormalities or soft tissue masses. IMPRESSION: *No acute intra-abdominal or pelvic pathology. *No change in the right lower quadrant ileostomy or colostomy correlate clinically. *Moderate amount of residual fecal material throughout the colon without obstruction or constipation. *Status post cholecystectomy. *No change in the lobular density along the anterior margin of the right lower lobe measuring 10 mm in maximum diameter. Although is better defined on today's examination  than on prior examination. Follow-up CT chest is recommended for better characterization of these nodular density and close interval follow-up 6-12 months is recommended. Electronically Signed   By: Fredrich Jefferson M.D.   On: 10/17/2023 15:15   DG Chest Port 1 View Result Date: 10/17/2023 CLINICAL DATA:  Chest pain EXAM: PORTABLE CHEST 1 VIEW COMPARISON:  X-ray 06/19/2023 FINDINGS: No consolidation, pneumothorax or effusion. No edema. Normal cardiopericardial silhouette. Overlapping cardiac leads. IMPRESSION: No acute cardiopulmonary disease. Electronically Signed   By: Adrianna Horde M.D.   On: 10/17/2023 12:36    Demaris Fillers, DO  [facility]  If 7PM-7AM, please contact night-coverage [email or web address] Password [email or web address] 10/18/2023, 12:20 PM   LOS: 1 day

## 2023-10-18 NOTE — Hospital Course (Addendum)
 [name] presented with abdominal pain.   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 She has had many hospitalizations, Most recently, she was admitted to [facility] from 06/19/2023 to 06/28/2023 when she was treated for hypotension and treated for hypovolemic shock.  She presented with abdominal discomfort and pain the ostomy site, irritation of the vaginal tissues, nausea and vomiting.  On her initial physical examination her blood pressure was 109.55, HR 79, RR 20 and 02 saturation 100%  Lungs with no wheezing or rales, heart with S1 and S2 present and regular with no gallops, rubs or murmurs, abdomen with tender to palpation, with ostomy in place, stoma site erythematous and irritated appearing.   Na 131, K 4.0 Cl 92 bicarbonate 21, glucose 101 bun 37 cr 2,50  AST 35 ALT 47  High sensitive troponin 3 and 3  Wbc 11,0 hgb 13,0 plt 480  Urine analysis SG 1,010, negative protein, negative leukocytes and negative hgb   CT abdomen and pelvis with no acute intra abdominal or pelvic pathology.  No change in right lower quadrant ostomy.  Moderate amount of residual fecal material throughout the colon without obstruction or constipation.  Sp post cholecystectomy.  No change in the lobular density along the anterior margin of the right lower lobe measuring 10 mm in maximum diameter. Recommended follow up in 6 to 12 months as outpatient.   Chest radiograph with no cardiomegaly, no effusions or infiltrates.   EKG 80 bpm, normal axis, right bundle branch block, qtc 487, sinus rhythm with left atrial enlargement, with no significant ST segment or T wave changes.   Patient was placed on IV diflucan due to genital and skin irritation.  IV fluids, as needed analgesics and antiemetics.   06/19 patient with  significant improvement in her rash, no further nausea or vomiting, her abdominal pain has improved.  Plan to follow up with GI as outpatient.

## 2023-10-19 DIAGNOSIS — L03311 Cellulitis of abdominal wall: Secondary | ICD-10-CM | POA: Diagnosis not present

## 2023-10-19 DIAGNOSIS — N179 Acute kidney failure, unspecified: Secondary | ICD-10-CM | POA: Diagnosis not present

## 2023-10-19 DIAGNOSIS — Z86711 Personal history of pulmonary embolism: Secondary | ICD-10-CM

## 2023-10-19 DIAGNOSIS — E119 Type 2 diabetes mellitus without complications: Secondary | ICD-10-CM

## 2023-10-19 DIAGNOSIS — K50119 Crohn's disease of large intestine with unspecified complications: Secondary | ICD-10-CM | POA: Diagnosis not present

## 2023-10-19 DIAGNOSIS — I10 Essential (primary) hypertension: Secondary | ICD-10-CM | POA: Diagnosis not present

## 2023-10-19 DIAGNOSIS — R569 Unspecified convulsions: Secondary | ICD-10-CM

## 2023-10-19 LAB — MAGNESIUM: Magnesium: 1.3 mg/dL — ABNORMAL LOW (ref 1.7–2.4)

## 2023-10-19 LAB — BASIC METABOLIC PANEL WITH GFR
Anion gap: 7 (ref 5–15)
BUN: 14 mg/dL (ref 6–20)
CO2: 28 mmol/L (ref 22–32)
Calcium: 8.3 mg/dL — ABNORMAL LOW (ref 8.9–10.3)
Chloride: 106 mmol/L (ref 98–111)
Creatinine, Ser: 1.26 mg/dL — ABNORMAL HIGH (ref 0.44–1.00)
GFR, Estimated: 51 mL/min — ABNORMAL LOW (ref >60)
Glucose, Bld: 94 mg/dL (ref 70–99)
Potassium: 3.1 mmol/L — ABNORMAL LOW (ref 3.5–5.1)
Sodium: 141 mmol/L (ref 135–145)

## 2023-10-19 LAB — GLUCOSE, CAPILLARY
Glucose-Capillary: 96 mg/dL (ref 70–99)
Glucose-Capillary: 115 mg/dL — ABNORMAL HIGH (ref 70–99)
Glucose-Capillary: 111 mg/dL — ABNORMAL HIGH (ref 70–99)
Glucose-Capillary: 99 mg/dL (ref 70–99)

## 2023-10-19 LAB — CBC
HCT: 27.4 % — ABNORMAL LOW (ref 36.0–46.0)
Hemoglobin: 9 g/dL — ABNORMAL LOW (ref 12.0–15.0)
MCH: 29.4 pg (ref 26.0–34.0)
MCHC: 32.8 g/dL (ref 30.0–36.0)
MCV: 89.5 fL (ref 80.0–100.0)
Platelets: 333 10*3/uL (ref 150–400)
RBC: 3.06 MIL/uL — ABNORMAL LOW (ref 3.87–5.11)
RDW: 12.8 % (ref 11.5–15.5)
WBC: 6 10*3/uL (ref 4.0–10.5)
nRBC: 0 % (ref 0.0–0.2)

## 2023-10-19 LAB — RPR: RPR Ser Ql: NONREACTIVE

## 2023-10-19 MED ORDER — ENOXAPARIN SODIUM 40 MG/0.4ML IJ SOSY
40.0000 mg | PREFILLED_SYRINGE | INTRAMUSCULAR | Status: DC
  Administered 2023-10-19: 40 mg via SUBCUTANEOUS
  Filled 2023-10-19: qty 0.4

## 2023-10-19 MED ORDER — MAGNESIUM SULFATE 4 GM/100ML IV SOLN
4.0000 g | Freq: Once | INTRAVENOUS | Status: AC
  Administered 2023-10-19: 4 g via INTRAVENOUS
  Filled 2023-10-19: qty 100

## 2023-10-19 MED ORDER — POTASSIUM CHLORIDE CRYS ER 20 MEQ PO TBCR
40.0000 meq | EXTENDED_RELEASE_TABLET | ORAL | Status: AC
  Administered 2023-10-19 (×2): 40 meq via ORAL
  Filled 2023-10-19 (×2): qty 2

## 2023-10-19 NOTE — Plan of Care (Signed)

## 2023-10-19 NOTE — Assessment & Plan Note (Addendum)
 Hypokalemia and hypomagnesemia.   Patient was placed on IV fluids and electrolytes were corrected,.  At the time of her discharge she is tolerating po well.  Her renal function has a serum cr of 1,15 with K at 4,1 and serum bicarbonate at 26  Na 137 and Mg 2.2   Follow up renal function and electrolytes as outpatient.

## 2023-10-19 NOTE — Progress Notes (Signed)
 Pt requesting ostomy pouch to be changed at this time. Necessary supplies ordered at this time.

## 2023-10-19 NOTE — Progress Notes (Signed)
 Pt requesting RN to contact MD because she wants to be transferred to [facility]. Pt verbalizes that she is unhappy that wound care did not change her ostomy today. After reminding pt that she had previously declined ostomy pouch change by primary RN on night of admission, she emphasizes that she wants the wound care nurse to do it.  I again offered to change her pouch at this time, to which she declined and stated later and with stronger pain medicine We also discussed that she does not medically meet the requirements to be transferred to [facility] such as [facility]. Pt verbalizes understanding at this time.

## 2023-10-19 NOTE — Assessment & Plan Note (Addendum)
 Clinically improved.  Antibiotic therapy has been discontinued.  Will hold on IV antifungal therapy for now  Continue close monitoring

## 2023-10-19 NOTE — Assessment & Plan Note (Signed)
Currently patient not on anticoagulation.  ?

## 2023-10-19 NOTE — Progress Notes (Addendum)
 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  She reported 1 episode of nausea and vomiting in the past week. As typical, the patient has numerous concerns.  She complains of having  numbness all over, particularly numbness in her bilateral arms and legs.  She denies any focal extremity weakness.  She denies any dysarthria or vision loss.  She denies any headache.  She denies any fevers or chills.  She continues to have a large amount of ostomy outpt.  Oral intake has not been great.  She complains of dizziness when she stands with near syncope.  She denies any chest pain, shortness of breath.  She complains of her usual abdominal pain.  She has had many hospitalizations, Most recently, she was admitted to [facility] from 06/19/2023 to 06/28/2023 when she was treated for hypotension and treated for hypovolemic shock.  Septic shock was ruled out.  During the hospitalization, the patient presented with hypotension and hypothermia and required Levophed .  Her home midodrine  was restarted and blood pressure eventually improved.  She complained of dysphagia and underwent endoscopy on 06/25/2023 which showed normal esophagus which was dilated, gastric and duodenal erosions with stigmata of recent bleed.  GI recommended PPI twice daily for 8 weeks.  Her Eliquis  was resumed at the time of discharge.  She also had toxic encephalopathy secondary to benzodiazepine and opioid overuse.  05/10/23 to 05/12/23 admission-- Treated for AKI secondary to dehydration from GI losses  secondary to high output ostomy  04/21/23 to 04/25/23 admission-- Treated for acute kidney injury related to 5 depletion from high ostomy output She also had proctitis for which she was prescribed mesalamine  enemas   hospitalized from 04/03/2023 to 04/10/2023 for septic shock related to her stoma site infection and prior candidemia.  The patient was discharged home with 5 more doses of doxycycline  and cefadroxil .  She was also sent home with Cresemba  to finish on 04/14/2023.  GI was also consulted secondary to rectal bleeding.  Flex sig was performed on 04/14/2023 which showed oral rectal mucosa with spontaneous oozing and touch friability.  the patient was discharged home with instructions for nightly steroid enemas for 4 weeks.. Notably, the patient had a prolonged hospitalization from 01/27/2023 to 03/09/2023 for fulminant close treatment of his colitis with perforation and pelvic abscesses requiring colectomy on 02/11/2023.  That hospitalization was prolonged secondary to Candida glabrata fungemia.  Her blood cultures remain negative at the time of discharge.  Assessment and Plan: * Cellulitis of abdominal wall Clinically improved.  Antibiotic therapy has been discontinued.  Will hold on IV antifungal therapy for now  Continue close monitoring   AKI (acute kidney injury) (HCC) Hypokalemia and hypomagnesemia.   Renal function with serum cr at 1,26 with K at 3.1 and serum bicarbonate at 28  Na 141 and Mg 1,34  Today with improvement in nausea and vomiting Add 40 meq Kcl 2 and and 4 g Mag sulfate Continue IV fluids and follow up renal function and electrolytes.   Crohn's disease with complication (HCC) Chronic diarrhea and chronic abdominal pain.  Nauseas and vomiting.   Continue scopolamine patch, loperamide   and as needed fentanyl .   Essential hypertension Continue blood pressure control with metoprolol .   Diabetes mellitus without complication (HCC) Continue glucose cover and  monitoring with insulin  sliding scale.  Patient is better tolerating po today.   History of pulmonary embolus (PE) Currently patient not on anticoagulation   Seizure (HCC) No signs of active seizures. Continue with keppra .         Subjective: Patient is feeling better, her nausea and vomiting have improved, abdominal rash has improved. Continue to have pain at the stoma   Physical Exam: Vitals:   10/19/23 0325 10/19/23 0331 10/19/23 0448 10/19/23 1308  BP: (!) 89/36 (!) 98/49 (!) 102/56 (!) 123/59  Pulse: 88 87 80 (!) 107  Resp:   18   Temp:   97.7 F (36.5 C)   TempSrc:   Oral   SpO2: 99%  100%   Weight:      Height:       Neurology awake and alert ENT With mild pallor Cardiovascular with S1 and S2 present and regular with no gallops or rubs Respiratory with no rales or wheezing, no rhonchi  Abdomen with no distention, ostomy in place with soft stool in the collection bag, tip of stoma is protruding with no local edema or discoloration  No lower extremity edema   Data Reviewed:    Family Communication: no family at the bedside   Disposition: Status is: Inpatient Remains inpatient appropriate because: IV fluids and electrolyte correction   Planned Discharge Destination: Home     Author: Albertus Alt, MD 10/19/2023 3:15 PM  For on call review [email or web address].

## 2023-10-19 NOTE — Assessment & Plan Note (Addendum)
 Her glucose remained stable, she was placed on insulin  sliding scale for glucose cover and monitoring At home will continue with diet control.

## 2023-10-19 NOTE — Assessment & Plan Note (Signed)
 No signs of active seizures.  Continue with keppra 

## 2023-10-19 NOTE — Progress Notes (Signed)
 Pt requesting PRN fentanyl  prior to wound care and ostomy pouch change. Initial BP of 89/36 MAP 56 with a 5 minute recheck BP of 98/49 MAP 64.  Pt was informed that PRN fentanyl  cannot be d/t soft BP. She verbalizes understanding but declines wound care to ostomy site at this time.  All ostomy/wound care supplies, including antifungal powder and no sting barrier wipes left at pt bedside.

## 2023-10-19 NOTE — Plan of Care (Signed)
  Problem: Elimination: Goal: Will not experience complications related to bowel motility Outcome: Progressing   Problem: Pain Managment: Goal: General experience of comfort will improve and/or be controlled Outcome: Progressing   Problem: Education: Goal: Knowledge of General Education information will improve Description: Including pain rating scale, medication(s)/side effects and non-pharmacologic comfort measures Outcome: Not Progressing   Problem: Coping: Goal: Level of anxiety will decrease Outcome: Not Progressing   Problem: Skin Integrity: Goal: Risk for impaired skin integrity will decrease Outcome: Not Progressing   Problem: Coping: Goal: Ability to adjust to condition or change in health will improve Outcome: Not Progressing   Problem: Elimination: Goal: Will not experience complications related to bowel motility Outcome: Progressing   Problem: Pain Managment: Goal: General experience of comfort will improve and/or be controlled Outcome: Progressing

## 2023-10-19 NOTE — Assessment & Plan Note (Signed)
 Continue blood pressure control with metoprolol.

## 2023-10-19 NOTE — Assessment & Plan Note (Addendum)
 Chronic diarrhea and chronic abdominal pain.  No signs of acute flare, recommended to follow up as outpatient with gastroenterology.

## 2023-10-20 DIAGNOSIS — L03311 Cellulitis of abdominal wall: Secondary | ICD-10-CM | POA: Diagnosis not present

## 2023-10-20 DIAGNOSIS — K50119 Crohn's disease of large intestine with unspecified complications: Secondary | ICD-10-CM | POA: Diagnosis not present

## 2023-10-20 DIAGNOSIS — N179 Acute kidney failure, unspecified: Secondary | ICD-10-CM | POA: Diagnosis not present

## 2023-10-20 DIAGNOSIS — I10 Essential (primary) hypertension: Secondary | ICD-10-CM | POA: Diagnosis not present

## 2023-10-20 LAB — CBC
HCT: 27.8 % — ABNORMAL LOW (ref 36.0–46.0)
Hemoglobin: 9.1 g/dL — ABNORMAL LOW (ref 12.0–15.0)
MCH: 30.3 pg (ref 26.0–34.0)
MCHC: 32.7 g/dL (ref 30.0–36.0)
MCV: 92.7 fL (ref 80.0–100.0)
Platelets: 336 10*3/uL (ref 150–400)
RBC: 3 MIL/uL — ABNORMAL LOW (ref 3.87–5.11)
RDW: 13 % (ref 11.5–15.5)
WBC: 7.2 10*3/uL (ref 4.0–10.5)
nRBC: 0 % (ref 0.0–0.2)

## 2023-10-20 LAB — BASIC METABOLIC PANEL WITH GFR
Anion gap: 5 (ref 5–15)
BUN: 8 mg/dL (ref 6–20)
CO2: 26 mmol/L (ref 22–32)
Calcium: 8.6 mg/dL — ABNORMAL LOW (ref 8.9–10.3)
Chloride: 106 mmol/L (ref 98–111)
Creatinine, Ser: 1.15 mg/dL — ABNORMAL HIGH (ref 0.44–1.00)
GFR, Estimated: 57 mL/min — ABNORMAL LOW (ref >60)
Glucose, Bld: 102 mg/dL — ABNORMAL HIGH (ref 70–99)
Potassium: 4.1 mmol/L (ref 3.5–5.1)
Sodium: 137 mmol/L (ref 135–145)

## 2023-10-20 LAB — GLUCOSE, CAPILLARY
Glucose-Capillary: 97 mg/dL (ref 70–99)
Glucose-Capillary: 111 mg/dL — ABNORMAL HIGH (ref 70–99)

## 2023-10-20 LAB — MAGNESIUM: Magnesium: 2.2 mg/dL (ref 1.7–2.4)

## 2023-10-20 MED ORDER — OXYCODONE HCL 5 MG PO TABS
5.0000 mg | ORAL_TABLET | Freq: Four times a day (QID) | ORAL | Status: DC | PRN
  Administered 2023-10-20: 5 mg via ORAL
  Filled 2023-10-20: qty 1

## 2023-10-20 MED ORDER — OXYCODONE HCL 5 MG PO TABS: 5.0000 mg | ORAL_TABLET | Freq: Three times a day (TID) | ORAL | 0 refills | Status: DC | PRN

## 2023-10-20 NOTE — Discharge Summary (Signed)
 Physician Discharge Summary   Patient: [name] MRN: [id number] DOB: [birth year]  Admit date:     10/17/2023  Discharge date: 10/20/23  Discharge Physician: Curlee Doss Saim Almanza   PCP: Christine Groves, PA-C   Recommendations at discharge:    Patient was placed on short course of oral analgesics and advised to follow up with gastroenterology as outpatient. Follow up with Christine Groves PA-C  Discharge Diagnoses: Principal Problem:   Cellulitis of abdominal wall Active Problems:   AKI (acute kidney injury) (HCC)   Crohn's disease with complication (HCC)   Essential hypertension   Diabetes mellitus without complication (HCC)   History of pulmonary embolus (PE)   Seizure (HCC)  Resolved Problems:   * No resolved hospital problems. [facility] Course: [name] presented with abdominal pain.   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 She has had many hospitalizations, Most recently, she was admitted to [facility] from 06/19/2023 to 06/28/2023 when she was treated for hypotension and treated for hypovolemic shock.  She presented with abdominal discomfort and pain the ostomy site, irritation of the vaginal tissues, nausea and vomiting.  On her initial physical examination her blood pressure was 109.55, HR 79, RR 20 and 02 saturation 100%  Lungs with no wheezing or rales, heart with S1 and S2 present and regular with no gallops, rubs or murmurs, abdomen with tender to palpation, with ostomy in place, stoma site erythematous and irritated appearing.   Na 131, K 4.0 Cl 92 bicarbonate 21, glucose 101 bun 37 cr 2,50  AST 35 ALT 47  High sensitive troponin 3 and 3  Wbc 11,0 hgb 13,0 plt 480  Urine analysis SG 1,010, negative protein, negative leukocytes and negative hgb   CT  abdomen and pelvis with no acute intra abdominal or pelvic pathology.  No change in right lower quadrant ostomy.  Moderate amount of residual fecal material throughout the colon without obstruction or constipation.  Sp post cholecystectomy.  No change in the lobular density along the anterior margin of the right lower lobe measuring 10 mm in maximum diameter. Recommended follow up in 6 to 12 months as outpatient.   Chest radiograph with no cardiomegaly, no effusions or infiltrates.   EKG 80 bpm, normal axis, right bundle branch block, qtc 487, sinus rhythm with left atrial enlargement, with no significant ST segment or T wave changes.   Patient was placed on IV diflucan due to genital and skin irritation.  IV fluids, as needed analgesics and antiemetics.   06/19 patient with significant improvement in her rash, no further nausea or vomiting, her abdominal pain has improved.  Plan to follow up with GI as outpatient.    Assessment and Plan: * Cellulitis of abdominal wall Clinically improved.  Antibiotic therapy has been discontinued.   Continue close monitoring as outpatient.   AKI (acute kidney injury) (HCC) Hypokalemia and hypomagnesemia.   Patient was placed on IV fluids and electrolytes were corrected,.  At the time of her discharge she is tolerating po well.  Her renal function has a serum cr of 1,15 with K at 4,1 and serum bicarbonate at 26  Na 137 and Mg 2.2   Follow up renal function and electrolytes as outpatient.   Crohn's disease with complication (HCC) Chronic diarrhea and chronic abdominal pain.  No signs of acute flare, recommended to follow  up as outpatient with gastroenterology.   Essential hypertension Continue blood pressure control with metoprolol .   Diabetes mellitus without complication (HCC) Her glucose remained stable, she was placed on insulin  sliding scale for glucose cover and monitoring At home will continue with diet control.   History of  pulmonary embolus (PE) Currently patient not on anticoagulation   Seizure (HCC) No signs of active seizures. Continue with keppra .        Consultants: none  Procedures performed: none   Disposition: Home Diet recommendation:  Regular diet DISCHARGE MEDICATION: Allergies as of 10/20/2023       Reactions   Codeine Shortness Of Breath, Swelling, Rash   Throat swelling   Hydrocodone  Shortness Of Breath, Swelling, Rash   Ketorolac  Nausea And Vomiting, Swelling, Nausea Only   Pt reports n/v and swelling to throat   Morphine And Codeine Shortness Of Breath, Swelling, Rash   Penicillins Shortness Of Breath, Swelling, Other (See Comments)   Throat swells 02/06/21--TOLERATES CEFTRIAXONE     Nickel Rash   Pregabalin  Other (See Comments)   Atarax  [hydroxyzine ] Itching, Swelling   Mildly swollen throat   Ativan  [lorazepam ] Other (See Comments)   Migraines.   Oxycodone -acetaminophen  Nausea Only, Nausea And Vomiting   Strawberry Extract Swelling   Watermelon Concentrate Swelling   Albuterol  Palpitations   Benadryl  [diphenhydramine  Hcl] Palpitations   Humira  [adalimumab ] Rash   Latex Rash   Remicade  [infliximab ] Rash   Blisters and Welts   Tramadol  Rash   Rash and itching        Medication List     STOP taking these medications    folic acid  1 MG tablet Commonly known as: FOLVITE    hydrocortisone  100 MG/60ML enema Commonly known as: CORTENEMA        TAKE these medications    albuterol  1.25 MG/3ML nebulizer solution Commonly known as: ACCUNEB  Take 1 ampule by nebulization every 6 (six) hours as needed for wheezing or shortness of breath.   ALPRAZolam  1 MG tablet Commonly known as: XANAX  Take 0.5 tablets (0.5 mg total) by mouth 2 (two) times daily as needed for anxiety. What changed:  how much to take when to take this   amitriptyline  75 MG tablet Commonly known as: ELAVIL  Take 75 mg by mouth at bedtime.   cetirizine 10 MG tablet Commonly known as:  ZYRTEC Take 10 mg by mouth daily.   cyclobenzaprine  10 MG tablet Commonly known as: FLEXERIL  Take 10 mg by mouth 3 (three) times daily.   feeding supplement Liqd Take 237 mLs by mouth 2 (two) times daily between meals.   gabapentin  300 MG capsule Commonly known as: NEURONTIN  Take 300 mg by mouth daily as needed (for pain).   hydrOXYzine  50 MG tablet Commonly known as: ATARAX  Take 25-50 mg by mouth 3 (three) times daily as needed for nausea or vomiting.   levETIRAcetam  750 MG tablet Commonly known as: KEPPRA  Take 750 mg by mouth 2 (two) times daily.   metoprolol  tartrate 25 MG tablet Commonly known as: LOPRESSOR  Take 0.5 tablets (12.5 mg total) by mouth 2 (two) times daily. What changed:  how much to take when to take this additional instructions   nitroGLYCERIN  0.4 MG SL tablet Commonly known as: NITROSTAT  Place 1 tablet (0.4 mg total) under the tongue every 5 (five) minutes as needed for chest pain.   oxyCODONE  5 MG immediate release tablet Commonly known as: Oxy IR/ROXICODONE  Take 1 tablet (5 mg total) by mouth every 8 (eight) hours as needed for severe pain (pain  score 7-10).   pantoprazole  40 MG tablet Commonly known as: PROTONIX  Take 1 tablet (40 mg total) by mouth 2 (two) times daily.   scopolamine 1 MG/3DAYS Commonly known as: TRANSDERM-SCOP Place 1 patch onto the skin every 3 (three) days. As needed   Spiriva  Respimat 2.5 MCG/ACT Aers Generic drug: Tiotropium Bromide  Monohydrate Inhale 2 puffs into the lungs daily.   triazolam  0.25 MG tablet Commonly known as: HALCION  Take 0.25 mg by mouth at bedtime.        Discharge Exam: Filed Weights   10/17/23 1827  Weight: 57.6 kg   BP 98/65 (BP Location: Left Arm)   Pulse 78   Temp 98 F (36.7 C) (Oral)   Resp 18   Ht 5' 4 (1.626 m)   Wt 57.6 kg   SpO2 100%   BMI 21.78 kg/m   Patient with no chest pain and no dyspnea, no nausea or vomiting and has been able to tolerate po well. Abdominal rash  has resolved and abdominal pain is controlled with analgesics.   Neurology awake and alert ENT with no pallor or icterus Cardiovascular with S1 and S2 present and regular with no gallops, rubs or murmurs Respiratory with no rales or wheezing, no rhonchi  Abdomen with no distention, soft and only mildly tender to palpation, no rashes at the site of ostomy No  lower extremity edema   Image from 10/19/23   Condition at discharge: stable  The results of significant diagnostics from this hospitalization (including imaging, microbiology, ancillary and laboratory) are listed below for reference.   Imaging Studies: MR BRAIN WO CONTRAST Result Date: 10/18/2023 EXAM DESCRIPTION: MR BRAIN WO CONTRAST CLINICAL HISTORY: Neuro deficit, acute, stroke suspected COMPARISON: None available TECHNIQUE: Non contrast MRI of the brain is performed according to our usual protocol including multiplanar multi sequence technique. FINDINGS: No acute intracranial hemorrhage, mass, edema, or hydrocephalus. No cortical atrophy. No encephalomalacia. No white matter disease. The vascular flow voids are unremarkable. No significant sinus disease. IMPRESSION: Unremarkable exam. Electronically signed by: Basilio Both MD 10/18/2023 07:13 PM EDT RP Workstation: [id number]   CT ABDOMEN PELVIS WO CONTRAST Result Date: 10/17/2023 CLINICAL DATA:  Abdominal pain nonlocalized EXAM: CT ABDOMEN AND PELVIS WITHOUT CONTRAST TECHNIQUE: Multidetector CT imaging of the abdomen and pelvis was performed following the standard protocol without IV contrast. RADIATION DOSE REDUCTION: This exam was performed according to the departmental dose-optimization program which includes automated exposure control, adjustment of the mA and/or kV according to patient size and/or use of iterative reconstruction technique. COMPARISON:  June 19 2023 FINDINGS: Lower chest: Comparison with prior examination accounting for differences in techniques there is no  significant change in the lobular density along the anterior margin of the right lower lobe measuring 10 mm in maximum diameter. Although is better defined on today's examination than on prior examination. Follow-up CT chest is recommended for better characterization of these nodular density and close interval follow-up 6-12 months is recommended Lung-RADS 3, probably benign findings. Short-term follow-up in 6 months is recommended with repeat low-dose chest CT without contrast (please use the following order, CT CHEST LCS NODULE FOLLOW-UP W/O CM). Hepatobiliary: No focal liver abnormality is seen. Status post cholecystectomy. No biliary dilatation. Pancreas: Pancreas normal size. No masses calcifications or inflammatory changes. Spleen: Spleen normal size.  No masses. Adrenals/Urinary Tract: Adrenal glands are normal size. Follow-up recommended. Kidneys are normal. No masses calcifications or hydronephrosis Stomach/Bowel: No small or large bowel obstruction or inflammatory changes. Moderate amount of residual fecal material  throughout the colon without obstruction or constipation. No change in the right lower quadrant ileostomy or colostomy correlate clinically. Vascular/Lymphatic: No significant vascular findings are present. No enlarged abdominal or pelvic lymph nodes. Reproductive: .  No masses. Bladder unremarkable. Other: Anterior abdominal wall unremarkable without evidence of umbilical or inguinal hernias Musculoskeletal: Visualized portion of the thoracolumbar spine and pelvic structures grossly unremarkable without evidence of fracture bony abnormalities or soft tissue masses. IMPRESSION: *No acute intra-abdominal or pelvic pathology. *No change in the right lower quadrant ileostomy or colostomy correlate clinically. *Moderate amount of residual fecal material throughout the colon without obstruction or constipation. *Status post cholecystectomy. *No change in the lobular density along the anterior  margin of the right lower lobe measuring 10 mm in maximum diameter. Although is better defined on today's examination than on prior examination. Follow-up CT chest is recommended for better characterization of these nodular density and close interval follow-up 6-12 months is recommended. Electronically Signed   By: Fredrich Jefferson M.D.   On: 10/17/2023 15:15   DG Chest Port 1 View Result Date: 10/17/2023 CLINICAL DATA:  Chest pain EXAM: PORTABLE CHEST 1 VIEW COMPARISON:  X-ray 06/19/2023 FINDINGS: No consolidation, pneumothorax or effusion. No edema. Normal cardiopericardial silhouette. Overlapping cardiac leads. IMPRESSION: No acute cardiopulmonary disease. Electronically Signed   By: Adrianna Horde M.D.   On: 10/17/2023 12:36    Microbiology: Results for orders placed or performed during the hospital encounter of 06/19/23  Blood Culture (routine x 2)     Status: None   Collection Time: 06/19/23  2:38 AM   Specimen: BLOOD  Result Value Ref Range Status   Specimen Description BLOOD BLOOD LEFT ARM  Final   Special Requests NONE  Final   Culture   Final    NO GROWTH 5 DAYS Performed at [facility], [address] [place], [place] [address]    Report Status 06/24/2023 FINAL  Final  Blood Culture (routine x 2)     Status: None   Collection Time: 06/19/23  2:59 AM   Specimen: BLOOD RIGHT HAND  Result Value Ref Range Status   Specimen Description BLOOD RIGHT HAND  Final   Special Requests   Final    BOTTLES DRAWN AEROBIC ONLY Blood Culture results may not be optimal due to an inadequate volume of blood received in culture bottles   Culture   Final    NO GROWTH 5 DAYS Performed at [facility], [address] [place], [place] [address]    Report Status 06/24/2023 FINAL  Final  MRSA Next Gen by PCR, Nasal     Status: None   Collection Time: 06/19/23  9:42 AM   Specimen: Nasal Mucosa; Nasal Swab  Result Value Ref Range Status   MRSA by PCR Next Gen NOT DETECTED NOT DETECTED Final    Comment:  (NOTE) The GeneXpert MRSA Assay (FDA approved for NASAL specimens only), is one component of a comprehensive MRSA colonization surveillance program. It is not intended to diagnose MRSA infection nor to guide or monitor treatment for MRSA infections. Test performance is not FDA approved in patients less than 58 years old. Performed at [facility], [address] [place], [place] [address]     Labs: CBC: Recent Labs  Lab 10/17/23 1208 10/18/23 0433 10/19/23 0417 10/20/23 0423  WBC 11.0* 8.6 6.0 7.2  HGB 13.9 10.6* 9.0* 9.1*  HCT 40.2 30.5* 27.4* 27.8*  MCV 86.3 87.1 89.5 92.7  PLT 480* 394 333 336  Basic Metabolic Panel: Recent Labs  Lab 10/17/23 1208 10/18/23 0433 10/19/23 0417 10/20/23 0423  NA 131* 136 141 137  K 4.0 3.3* 3.1* 4.1  CL 92* 97* 106 106  CO2 21* 26 28 26   GLUCOSE 101* 89 94 102*  BUN 37* 32* 14 8  CREATININE 2.50* 1.75* 1.26* 1.15*  CALCIUM  9.5 8.4* 8.3* 8.6*  MG  --  1.3* 1.3* 2.2  PHOS  --  4.4  --   --    Liver Function Tests: Recent Labs  Lab 10/17/23 1208 10/18/23 0433  AST 36 23  ALT 47* 31  ALKPHOS 176* 126  BILITOT 0.8 0.6  PROT 8.3* 5.9*  ALBUMIN  4.1 3.0*   CBG: Recent Labs  Lab 10/19/23 1140 10/19/23 1734 10/19/23 2127 10/20/23 0722 10/20/23 1106  GLUCAP 115* 111* 99 97 111*    Discharge time spent: greater than 30 minutes.  Signed: Albertus Alt, MD [facility] 10/20/2023

## 2023-10-20 NOTE — Plan of Care (Signed)

## 2023-10-20 NOTE — Plan of Care (Signed)

## 2023-10-20 NOTE — TOC Transition Note (Signed)
 Transition of Care [facility]) - Discharge Note   Patient Details  Name: [name] MRN: [id number] Date of Birth: 09/14/1970  Transition of [facility] CM/SW Contact:  Geraldina Klinefelter, RN Phone Number: 10/20/2023, 3:26 PM   Clinical Narrative:    Pt to dc home. Follow-up instructions/appts are on AVS.    Final next level of care: Home/Self Care Barriers to Discharge: Barriers Resolved   Patient Goals and CMS Choice Patient states their goals for this hospitalization and ongoing recovery are:: return home   Choice offered to / list presented to : Patient [place] ownership interest in [facility].provided to::  (n/a)    Discharge Placement                       Discharge Plan and Services Additional resources added to the After Visit Summary for   In-house Referral: Clinical Social Work                                   Social Drivers of Health (SDOH) Interventions SDOH Screenings   Food Insecurity: No Food Insecurity (10/17/2023)  Housing: High Risk (10/17/2023)  Transportation Needs: Unmet Transportation Needs (10/17/2023)  Utilities: At Risk (10/17/2023)  Financial Resource Strain: Medium Risk (06/09/2023)   Received from [facility]  Physical Activity: Inactive (06/09/2023)   Received from [facility]  Social Connections: Socially Isolated (06/09/2023)   Received from [facility]  Stress: Stress Concern Present (06/09/2023)   Received from [facility]  Tobacco Use: High Risk (10/17/2023)  Health Literacy: Medium Risk (06/09/2023)   Received from [facility]     Readmission Risk Interventions    10/18/2023    8:25 AM 06/21/2023    2:07 PM 05/12/2023   11:42 AM  Readmission Risk Prevention Plan  Transportation Screening Complete Complete Complete  Medication Review (RN Care Manager) Complete Referral to Pharmacy   PCP or Specialist appointment within 3-5 days of discharge  Complete Complete  HRI or Home Care Consult  Complete Complete Complete  SW Recovery Care/Counseling Consult  Complete Complete  Palliative Care Screening Not Applicable Not Applicable Not Applicable  Skilled Nursing Facility Not Applicable  Not Applicable

## 2023-12-15 ENCOUNTER — Telehealth (INDEPENDENT_AMBULATORY_CARE_PROVIDER_SITE_OTHER): Payer: Self-pay

## 2023-12-15 NOTE — Telephone Encounter (Signed)
 Patient called the office this morning stating she has started having issues with blood in her ostomy bag. She is having some lower abdominal pain also. I spoke with Mitzie Boettcher np whom suggested the patient go to the Ed. I made the patient aware she will need to go to the ED. Patient states understanding.

## 2023-12-22 ENCOUNTER — Ambulatory Visit (INDEPENDENT_AMBULATORY_CARE_PROVIDER_SITE_OTHER): Admitting: Gastroenterology

## 2023-12-22 ENCOUNTER — Encounter (INDEPENDENT_AMBULATORY_CARE_PROVIDER_SITE_OTHER): Payer: Self-pay | Admitting: Gastroenterology

## 2023-12-22 VITALS — BP 91/65 | HR 87 | Temp 97.3°F | Ht 64.5 in | Wt 127.0 lb

## 2023-12-22 DIAGNOSIS — K5090 Crohn's disease, unspecified, without complications: Secondary | ICD-10-CM

## 2023-12-22 DIAGNOSIS — Z932 Ileostomy status: Secondary | ICD-10-CM | POA: Diagnosis not present

## 2023-12-22 DIAGNOSIS — K50118 Crohn's disease of large intestine with other complication: Secondary | ICD-10-CM

## 2023-12-22 NOTE — Progress Notes (Signed)
 Toribio Fortune, M.D. Gastroenterology & Hepatology [facility] [address], [place] [address]  Primary Care Physician: Job Browning, PA-C [address] [place] [address]  I will communicate my assessment and recommendations to the referring MD via EMR.  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  The patient was last seen on 09/12/2023. At that time, the patient was continued on Protonix  40 mg twice a day.  Had butyrate enemas sent to her pharmacy again, but it appears she never filled this prescription.  Notably, patient called to our office on 12/15/2023 stating that she saw some blood in her ostomy bag.  She was advised to go to the ER to have further evaluation.  Patient was admitted to [facility] on 10/17/2023.  At that time she was admitted with hypotension and hypovolemic shock.  The CT abdomen pelvis with IV contrast showed moderate amount of residual fecal material in the colon.  She was treated for abdominal wall cellulitis and IV fluids for  dehydration.  She states that ever since she had her ostomy placed, she has had to empty her bag 11-12 times per day. She tries to drink a fair amount of liquid , but most of it is sweetened tea and [facility]. Has seen some blood in the bag two times in the past. Stool is usually  watery in the ostomy bag. Not taking any Imodium  or other antidiarrheals.   She is also presenting recurrent nausea with some vomiting episodes. She has had significant dizziness frequently.  Also reports having frequent tenderness and soreness around the area of the ostomy bag.  Also reports that she has had some leaking from the bag.  The patient denies having any fever, chills, hematochezia, melena, hematemesis, abdominal distention, abdominal pain, jaundice, pruritus. Lost a couple of lb since the last visit per patient.   Last EGD: 06/2023 - Normal esophagus. Dilated. Biopsied.                           - Gastritis. Biopsied.                           - Duodenal erosions with stigmata of recent                            bleeding.   Flexible sigmoidoscopy 04/08/2023 by Dr. Shaaron - Diffuse proctitis involving the rectal stump benign appearing rectal stricture. Biopsies taken. Differential includes active inflammatory bowel disease +/- diversion colitis.  Rectal stump biopsies: Predominant ulcer with granulation tissue and limited colonic mucosa.  Negative for granuloma, dysplasia or malignancy. ( rectal biopsies reveal ulcer nonspecific. 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 Begin short chain fatty acid enema in the way of a single butyrate enema 1 enema nightly x30 days)   Total colectomy 03/13/2023: Path report showed pseudomembranous colitis with mucosal necrosis.  Marked acute  and chronic fibrinous serositis.  Mucosal ulceration necrosis with transmural inflammation and chronic cervicitis present in the margins.  2 benign pericolic lymph nodes.  Past Medical History: Past Medical History:  Diagnosis Date   Anxiety    Asthma    Chronic low back pain    Collagen vascular disease (HCC)    COPD (chronic obstructive pulmonary disease) (HCC)    COVID-19 virus infection 01/16/2021   Crohn's disease (HCC)    Enteritis due to Clostridium difficile 12/24/2021   Fungal endocarditis 03/28/2023    Fungemia 02/18/2023   GERD (gastroesophageal reflux disease)    EGD 01/2007 by Dr.Rourke small hiatal hernia s/p 56 french maloney    GI bleed 01/15/2022   History of head injury    Hypertension    IBS (irritable bowel syndrome)    PSVT (paroxysmal supraventricular tachycardia) (HCC)    PTSD (post-traumatic stress disorder)    Pulmonary embolus (HCC) 02/18/2023   Rectal bleeding 01/12/2022   Recurrent chest pain    Rhabdomyolysis 01/16/2021   Seizure disorder (HCC)    Type 2 diabetes mellitus (HCC)    Upper GI bleed 01/27/2023    Past Surgical History: Past Surgical History:  Procedure Laterality Date   ABDOMINAL HYSTERECTOMY     with right salpingo oophorectomy 2005   APPENDECTOMY  2006   BALLOON DILATION N/A 02/19/2021   Procedure: BALLOON DILATION;  Surgeon: Cindie Carlin POUR, DO;  [facility];  Service: Endoscopy;  Laterality: N/A;  Sigmoid colon stricture   BIOPSY  02/06/2021   Procedure: BIOPSY;  Surgeon: Cindie Carlin POUR, DO;  [facility];  Service: Endoscopy;;   BIOPSY  02/19/2021   Procedure: BIOPSY;  Surgeon: Cindie Carlin POUR, DO;  [facility];  Service: Endoscopy;;   BIOPSY  07/15/2021   Procedure: BIOPSY;  Surgeon: Golda Claudis PENNER, MD;  [facility];  Service: Endoscopy;;   BIOPSY  04/08/2023   Procedure: BIOPSY;  Surgeon: Shaaron Lamar HERO, MD;  [facility];  Service: Endoscopy;;   BIOPSY  06/25/2023   Procedure: BIOPSY;  Surgeon: Federico Rosario BROCKS, MD;  Location: [facility] ENDOSCOPY;  Service: Gastroenterology;;   CESAREAN SECTION     1996   CHOLECYSTECTOMY     COLECTOMY N/A 02/11/2023   Procedure: TOTAL COLECTOMY;  Surgeon: Vanderbilt Ned, MD;  Location: [facility] OR;  Service: General;  Laterality: N/A;   COLONOSCOPY  2010   Dr. Shaaron; negative except for hemorrhoids   COLOSTOMY  02/11/2023   Procedure: COLOSTOMY;  Surgeon: Vanderbilt Ned, MD;  Location: [facility] OR;  Service: General;;   ESOPHAGEAL DILATION N/A 10/25/2014    Procedure: ESOPHAGEAL DILATION;  Surgeon: Claudis PENNER Golda, MD;  [facility];  Service: Endoscopy;  Laterality: N/A;   ESOPHAGOGASTRODUODENOSCOPY N/A 10/25/2014   Procedure: ESOPHAGOGASTRODUODENOSCOPY (EGD);  Surgeon: Claudis PENNER Golda, MD;  [facility];  Service: Endoscopy;  Laterality: N/A;  1250   ESOPHAGOGASTRODUODENOSCOPY (EGD) WITH PROPOFOL  N/A 06/25/2023   Procedure: ESOPHAGOGASTRODUODENOSCOPY (EGD) WITH PROPOFOL ;  Surgeon: Federico Rosario BROCKS, MD;  Location: [facility] ENDOSCOPY;  Service: Gastroenterology;  Laterality: N/A;   FLEXIBLE SIGMOIDOSCOPY N/A 02/06/2021   Procedure: FLEXIBLE SIGMOIDOSCOPY;  Surgeon: Cindie Carlin POUR, DO;  [facility];  Service: Endoscopy;  Laterality: N/A;   FLEXIBLE SIGMOIDOSCOPY N/A 02/19/2021   Procedure: FLEXIBLE SIGMOIDOSCOPY;  Surgeon: Cindie Carlin POUR, DO;  [facility];  Service: Endoscopy;  Laterality: N/A;   FLEXIBLE SIGMOIDOSCOPY N/A 02/02/2023   Procedure: FLEXIBLE  SIGMOIDOSCOPY;  Surgeon: Albertus Gordy HERO, MD;  Location: [facility] ENDOSCOPY;  Service: Gastroenterology;  Laterality: N/A;   FLEXIBLE SIGMOIDOSCOPY N/A 02/10/2023   Procedure: FLEXIBLE SIGMOIDOSCOPY;  Surgeon: Legrand Victory LITTIE DOUGLAS, MD;  Location: [facility] ENDOSCOPY;  Service: Gastroenterology;  Laterality: N/A;   FLEXIBLE SIGMOIDOSCOPY N/A 04/08/2023   Procedure: FLEXIBLE SIGMOIDOSCOPY;  Surgeon: Shaaron Lamar HERO, MD;  [facility];  Service: Endoscopy;  Laterality: N/A;   IR FLUORO GUIDE CV LINE LEFT  02/21/2023   IR US  GUIDE VASC ACCESS LEFT  02/21/2023   LAPAROTOMY N/A 02/11/2023   Procedure: EXPLORATORY LAPAROTOMY;  Surgeon: Vanderbilt Ned, MD;  [facility];  Service: General;  Laterality: N/A;   OOPHORECTOMY     left for torsion and ovarian fibroma; uterine myoma resected 1995   SAVORY DILATION N/A 06/25/2023   Procedure: SAVORY DILATION;  Surgeon: Federico Rosario BROCKS, MD;  Location: [facility] ENDOSCOPY;  Service: Gastroenterology;  Laterality: N/A;    SIGMOIDOSCOPY  07/15/2021   Procedure: SIGMOIDOSCOPY;  Surgeon: Golda Claudis PENNER, MD;  [facility];  Service: Endoscopy;;   TRANSESOPHAGEAL ECHOCARDIOGRAM (CATH LAB) N/A 02/18/2023   Procedure: TRANSESOPHAGEAL ECHOCARDIOGRAM;  Surgeon: Barbaraann Darryle Ned, MD;  Location: [facility] INVASIVE CV LAB;  Service: Cardiovascular;  Laterality: N/A;    Family History: Family History  Problem Relation Age of Onset   Cancer Mother    Heart failure Mother    Hypertension Mother    Heart attack Mother    Heart attack Father 25   Cancer Father 38   Heart failure Father    Diabetes Father    Crohn's disease Cousin     Social History: Social History   Tobacco Use  Smoking Status Every Day   Current packs/day: 0.50   Average packs/day: 0.5 packs/day for 39.2 years (19.6 ttl pk-yrs)   Types: Cigarettes   Start date: 09/26/1984   Passive exposure: Current  Smokeless Tobacco Never   Social History   Substance and Sexual Activity  Alcohol  Use No   Alcohol /week: 0.0 standard drinks of alcohol    Social History   Substance and Sexual Activity  Drug Use Yes   Types: Marijuana    Allergies: Allergies  Allergen Reactions   Codeine Shortness Of Breath, Swelling and Rash    Throat swelling   Hydrocodone  Shortness Of Breath, Swelling and Rash   Ketorolac  Nausea And Vomiting, Swelling and Nausea Only    Pt reports n/v and swelling to throat   Morphine And Codeine Shortness Of Breath, Swelling and Rash   Penicillins Shortness Of Breath, Swelling and Other (See Comments)    Throat swells 02/06/21--TOLERATES CEFTRIAXONE      Nickel Rash   Pregabalin  Other (See Comments)   Atarax  [Hydroxyzine ] Itching and Swelling    Mildly swollen throat   Ativan  [Lorazepam ] Other (See Comments)    Migraines.   Oxycodone -Acetaminophen  Nausea Only and Nausea And Vomiting   Strawberry Extract Swelling   Watermelon Concentrate Swelling   Albuterol  Palpitations   Benadryl  [Diphenhydramine  Hcl]  Palpitations   Humira  [Adalimumab ] Rash   Latex Rash   Remicade  [Infliximab ] Rash    Blisters and Welts    Tramadol  Rash    Rash and itching    Medications: Current Outpatient Medications  Medication Sig Dispense Refill   albuterol  (ACCUNEB ) 1.25 MG/3ML nebulizer solution Take 1 ampule by nebulization every 6 (six) hours as needed for wheezing or shortness of breath.     ALPRAZolam  (XANAX ) 1 MG tablet Take 0.5 tablets (0.5 mg total)  by mouth 2 (two) times daily as needed for anxiety. (Patient taking differently: Take 1 mg by mouth 3 (three) times daily.)     amitriptyline  (ELAVIL ) 75 MG tablet Take 75 mg by mouth at bedtime.     cetirizine (ZYRTEC) 10 MG tablet Take 10 mg by mouth daily.     cyclobenzaprine  (FLEXERIL ) 10 MG tablet Take 10 mg by mouth 3 (three) times daily.     feeding supplement (ENSURE ENLIVE / ENSURE PLUS) LIQD Take 237 mLs by mouth 2 (two) times daily between meals.     gabapentin  (NEURONTIN ) 300 MG capsule Take 300 mg by mouth daily as needed (for pain).     hydrOXYzine  (ATARAX ) 50 MG tablet Take 25-50 mg by mouth 3 (three) times daily as needed for nausea or vomiting.     metoprolol  tartrate (LOPRESSOR ) 25 MG tablet Take 0.5 tablets (12.5 mg total) by mouth 2 (two) times daily. (Patient taking differently: Take 25-50 mg by mouth See admin instructions. Take 25mg  in the morning and 50mg  at bedtime) 30 tablet 1   nitroGLYCERIN  (NITROSTAT ) 0.4 MG SL tablet Place 1 tablet (0.4 mg total) under the tongue every 5 (five) minutes as needed for chest pain. 30 tablet 0   oxyCODONE  (OXY IR/ROXICODONE ) 5 MG immediate release tablet Take 1 tablet (5 mg total) by mouth every 8 (eight) hours as needed for severe pain (pain score 7-10). 10 tablet 0   pantoprazole  (PROTONIX ) 40 MG tablet Take 1 tablet (40 mg total) by mouth 2 (two) times daily. 60 tablet 3   scopolamine  (TRANSDERM-SCOP) 1 MG/3DAYS Place 1 patch onto the skin every 3 (three) days. As needed     SPIRIVA  RESPIMAT 2.5  MCG/ACT AERS Inhale 2 puffs into the lungs daily.     triazolam  (HALCION ) 0.25 MG tablet Take 0.25 mg by mouth at bedtime.     levETIRAcetam  (KEPPRA ) 750 MG tablet Take 750 mg by mouth 2 (two) times daily. (Patient not taking: Reported on 12/22/2023)     No current facility-administered medications for this visit.    Review of Systems: GENERAL: negative for malaise, night sweats HEENT: No changes in hearing or vision, no nose bleeds or other nasal problems. NECK: Negative for lumps, goiter, pain and significant neck swelling RESPIRATORY: Negative for cough, wheezing CARDIOVASCULAR: Negative for chest pain, leg swelling, palpitations, orthopnea GI: SEE HPI MUSCULOSKELETAL: Negative for joint pain or swelling, back pain, and muscle pain. SKIN: Negative for lesions, rash PSYCH: Negative for sleep disturbance, mood disorder and recent psychosocial stressors. HEMATOLOGY Negative for prolonged bleeding, bruising easily, and swollen nodes. ENDOCRINE: Negative for cold or heat intolerance, polyuria, polydipsia and goiter. NEURO: negative for tremor, gait imbalance, syncope and seizures. The remainder of the review of systems is noncontributory.   Physical Exam: BP 91/65 (BP Location: Left Arm, Patient Position: Sitting, Cuff Size: Normal)   Pulse 87   Temp (!) 97.3 F (36.3 C) (Temporal)   Ht 5' 4.5 (1.638 m)   Wt 127 lb (57.6 kg)   BMI 21.46 kg/m  GENERAL: The patient is AO x3, in no acute distress. HEENT: Head is normocephalic and atraumatic. EOMI are intact. Mouth is well hydrated and without lesions. NECK: Supple. No masses LUNGS: Clear to auscultation. No presence of rhonchi/wheezing/rales. Adequate chest expansion HEART: RRR, normal s1 and s2. ABDOMEN: Diffusely tender, no guarding, no peritoneal signs, and nondistended. BS +. No masses.  Has a right-sided ostomy, with poor seal in the ostomy bag, there is some leakage.  Ostomy  looks healthy and without any ulceration or  inflammation. EXTREMITIES: Without any cyanosis, clubbing, rash, lesions or edema. NEUROLOGIC: AOx3, no focal motor deficit. SKIN: no jaundice, no rashes  Imaging/Labs: as above  I personally reviewed and interpreted the available labs, imaging and endoscopic files.  Impression and Plan: [name] is a 53 y.o. 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  Patient has presented increased output from ostomy ever since she had her initial colectomy performed  and  ileostomy placement.  We thoroughly discussed in detail the best management of this which includes low intake of carbohydrates and oral feeds and keeping hydrated with hydration salts.  She can take antidiarrheals if this is not enough to keep her hydrated and she persist with high output from ostomy.  However, if persistent symptoms of ongoing diarrhea or bleeding from ostomy site, will need to proceed with ileoscopy but I considered less likely if she is now having progression of her Crohn's to small bowel disease.  Will obtain surveillance labs today.  Finally, she has had significant issues with her ostomy.  She will benefit from education regarding best way to take care of this.  I will refer her back to ostomy nurse.  -Increase intake of water , Pedialyte or sugar-free Gatorade.  She was advised to stop drinking sodas or sweet tea -Can take Imodium  as needed to decrease output from ostomy.  Can take 1 to 2 pills/day. -Check CBC, CMP, CRP and iron panel -Referral to ostomy nurse  All questions were answered.      Toribio Fortune,  MD Gastroenterology and Hepatology [facility]

## 2023-12-22 NOTE — Patient Instructions (Addendum)
 Increase intake of water , Pedialyte or sugar-free Gatorade.  Please stop drinking sodas or sweet tea Can take Imodium  as needed to decrease output from ostomy.  Can take 1 to 2 pills/day. Perform blood workup Referral to ostomy nurse

## 2023-12-28 ENCOUNTER — Other Ambulatory Visit (INDEPENDENT_AMBULATORY_CARE_PROVIDER_SITE_OTHER): Payer: Self-pay | Admitting: Gastroenterology

## 2023-12-29 ENCOUNTER — Inpatient Hospital Stay (HOSPITAL_COMMUNITY)
Admission: EM | Admit: 2023-12-29 | Discharge: 2023-12-31 | DRG: 682 | Disposition: A | Discharge status: Home / Self Care | Attending: Internal Medicine | Admitting: Internal Medicine

## 2023-12-29 ENCOUNTER — Ambulatory Visit: Payer: Self-pay | Admitting: Gastroenterology

## 2023-12-29 ENCOUNTER — Other Ambulatory Visit: Payer: Self-pay

## 2023-12-29 ENCOUNTER — Telehealth (INDEPENDENT_AMBULATORY_CARE_PROVIDER_SITE_OTHER): Payer: Self-pay | Admitting: Gastroenterology

## 2023-12-29 ENCOUNTER — Encounter (HOSPITAL_COMMUNITY): Payer: Self-pay | Admitting: Emergency Medicine

## 2023-12-29 DIAGNOSIS — E785 Hyperlipidemia, unspecified: Secondary | ICD-10-CM | POA: Diagnosis present

## 2023-12-29 DIAGNOSIS — Z933 Colostomy status: Secondary | ICD-10-CM

## 2023-12-29 DIAGNOSIS — Z833 Family history of diabetes mellitus: Secondary | ICD-10-CM

## 2023-12-29 DIAGNOSIS — Z86711 Personal history of pulmonary embolism: Secondary | ICD-10-CM

## 2023-12-29 DIAGNOSIS — E872 Acidosis, unspecified: Secondary | ICD-10-CM | POA: Diagnosis present

## 2023-12-29 DIAGNOSIS — K5010 Crohn's disease of large intestine without complications: Secondary | ICD-10-CM | POA: Diagnosis present

## 2023-12-29 DIAGNOSIS — Z888 Allergy status to other drugs, medicaments and biological substances status: Secondary | ICD-10-CM

## 2023-12-29 DIAGNOSIS — R197 Diarrhea, unspecified: Secondary | ICD-10-CM | POA: Diagnosis present

## 2023-12-29 DIAGNOSIS — Z885 Allergy status to narcotic agent status: Secondary | ICD-10-CM

## 2023-12-29 DIAGNOSIS — I951 Orthostatic hypotension: Secondary | ICD-10-CM | POA: Diagnosis present

## 2023-12-29 DIAGNOSIS — K219 Gastro-esophageal reflux disease without esophagitis: Secondary | ICD-10-CM | POA: Diagnosis present

## 2023-12-29 DIAGNOSIS — R569 Unspecified convulsions: Secondary | ICD-10-CM

## 2023-12-29 DIAGNOSIS — K50919 Crohn's disease, unspecified, with unspecified complications: Secondary | ICD-10-CM | POA: Diagnosis present

## 2023-12-29 DIAGNOSIS — E871 Hypo-osmolality and hyponatremia: Secondary | ICD-10-CM | POA: Diagnosis present

## 2023-12-29 DIAGNOSIS — N179 Acute kidney failure, unspecified: Principal | ICD-10-CM | POA: Diagnosis present

## 2023-12-29 DIAGNOSIS — Z9109 Other allergy status, other than to drugs and biological substances: Secondary | ICD-10-CM

## 2023-12-29 DIAGNOSIS — J4489 Other specified chronic obstructive pulmonary disease: Secondary | ICD-10-CM | POA: Diagnosis present

## 2023-12-29 DIAGNOSIS — E538 Deficiency of other specified B group vitamins: Secondary | ICD-10-CM | POA: Diagnosis present

## 2023-12-29 DIAGNOSIS — M545 Low back pain, unspecified: Secondary | ICD-10-CM | POA: Diagnosis present

## 2023-12-29 DIAGNOSIS — Z87828 Personal history of other (healed) physical injury and trauma: Secondary | ICD-10-CM

## 2023-12-29 DIAGNOSIS — G40909 Epilepsy, unspecified, not intractable, without status epilepticus: Secondary | ICD-10-CM

## 2023-12-29 DIAGNOSIS — E119 Type 2 diabetes mellitus without complications: Secondary | ICD-10-CM | POA: Diagnosis present

## 2023-12-29 DIAGNOSIS — Z604 Social exclusion and rejection: Secondary | ICD-10-CM | POA: Diagnosis present

## 2023-12-29 DIAGNOSIS — Z86718 Personal history of other venous thrombosis and embolism: Secondary | ICD-10-CM

## 2023-12-29 DIAGNOSIS — H109 Unspecified conjunctivitis: Secondary | ICD-10-CM | POA: Diagnosis present

## 2023-12-29 DIAGNOSIS — Z5982 Transportation insecurity: Secondary | ICD-10-CM

## 2023-12-29 DIAGNOSIS — E861 Hypovolemia: Secondary | ICD-10-CM | POA: Diagnosis present

## 2023-12-29 DIAGNOSIS — I471 Supraventricular tachycardia, unspecified: Secondary | ICD-10-CM | POA: Diagnosis present

## 2023-12-29 DIAGNOSIS — Z9049 Acquired absence of other specified parts of digestive tract: Secondary | ICD-10-CM

## 2023-12-29 DIAGNOSIS — F419 Anxiety disorder, unspecified: Secondary | ICD-10-CM | POA: Diagnosis present

## 2023-12-29 DIAGNOSIS — G8929 Other chronic pain: Secondary | ICD-10-CM | POA: Diagnosis present

## 2023-12-29 DIAGNOSIS — R571 Hypovolemic shock: Secondary | ICD-10-CM | POA: Diagnosis present

## 2023-12-29 DIAGNOSIS — F4310 Post-traumatic stress disorder, unspecified: Secondary | ICD-10-CM | POA: Diagnosis present

## 2023-12-29 DIAGNOSIS — Z79899 Other long term (current) drug therapy: Secondary | ICD-10-CM

## 2023-12-29 DIAGNOSIS — Z8619 Personal history of other infectious and parasitic diseases: Secondary | ICD-10-CM

## 2023-12-29 DIAGNOSIS — I10 Essential (primary) hypertension: Secondary | ICD-10-CM | POA: Diagnosis present

## 2023-12-29 DIAGNOSIS — Z90710 Acquired absence of both cervix and uterus: Secondary | ICD-10-CM

## 2023-12-29 DIAGNOSIS — Z91040 Latex allergy status: Secondary | ICD-10-CM

## 2023-12-29 DIAGNOSIS — Z8616 Personal history of COVID-19: Secondary | ICD-10-CM

## 2023-12-29 DIAGNOSIS — Z5986 Financial insecurity: Secondary | ICD-10-CM

## 2023-12-29 DIAGNOSIS — Z880 Allergy status to penicillin: Secondary | ICD-10-CM

## 2023-12-29 DIAGNOSIS — F17210 Nicotine dependence, cigarettes, uncomplicated: Secondary | ICD-10-CM | POA: Diagnosis present

## 2023-12-29 DIAGNOSIS — Z8249 Family history of ischemic heart disease and other diseases of the circulatory system: Secondary | ICD-10-CM

## 2023-12-29 LAB — COMPREHENSIVE METABOLIC PANEL WITH GFR
AG Ratio: 1.5 (calc) (ref 1.0–2.5)
ALT: 31 U/L — ABNORMAL HIGH (ref 6–29)
ALT: 34 U/L (ref 0–44)
AST: 26 U/L (ref 10–35)
AST: 31 U/L (ref 15–41)
Albumin: 4.3 g/dL (ref 3.6–5.1)
Albumin: 4.1 g/dL (ref 3.5–5.0)
Alkaline Phosphatase: 145 U/L — ABNORMAL HIGH (ref 38–126)
Alkaline phosphatase (APISO): 142 U/L (ref 37–153)
Anion gap: 16 — ABNORMAL HIGH (ref 5–15)
BUN/Creatinine Ratio: 10 (calc) (ref 6–22)
BUN: 30 mg/dL — ABNORMAL HIGH (ref 7–25)
BUN: 24 mg/dL — ABNORMAL HIGH (ref 6–20)
CO2: 20 mmol/L (ref 20–32)
CO2: 16 mmol/L — ABNORMAL LOW (ref 22–32)
Calcium: 10.4 mg/dL (ref 8.6–10.4)
Calcium: 9.9 mg/dL (ref 8.9–10.3)
Chloride: 101 mmol/L (ref 98–110)
Chloride: 99 mmol/L (ref 98–111)
Creat: 2.88 mg/dL — ABNORMAL HIGH (ref 0.50–1.03)
Creatinine, Ser: 2.21 mg/dL — ABNORMAL HIGH (ref 0.44–1.00)
GFR, Estimated: 26 mL/min — ABNORMAL LOW (ref >60)
Globulin: 2.9 g/dL (ref 1.9–3.7)
Glucose, Bld: 81 mg/dL (ref 65–99)
Glucose, Bld: 85 mg/dL (ref 70–99)
Potassium: 5.2 mmol/L (ref 3.5–5.3)
Potassium: 4.9 mmol/L (ref 3.5–5.1)
Sodium: 132 mmol/L — ABNORMAL LOW (ref 135–146)
Sodium: 131 mmol/L — ABNORMAL LOW (ref 135–145)
Total Bilirubin: 0.4 mg/dL (ref 0.2–1.2)
Total Bilirubin: 0.7 mg/dL (ref 0.0–1.2)
Total Protein: 7.2 g/dL (ref 6.1–8.1)
Total Protein: 8.4 g/dL — ABNORMAL HIGH (ref 6.5–8.1)
eGFR: 19 mL/min/1.73m2 — ABNORMAL LOW (ref >=60)

## 2023-12-29 LAB — C-REACTIVE PROTEIN: CRP: 3.7 mg/L (ref <8.0)

## 2023-12-29 LAB — CBC WITH DIFFERENTIAL/PLATELET
Absolute Lymphocytes: 2760 {cells}/uL (ref 850–3900)
Absolute Monocytes: 484 {cells}/uL (ref 200–950)
Basophils Absolute: 72 {cells}/uL (ref 0–200)
Basophils Relative: 0.7 %
Eosinophils Absolute: 464 {cells}/uL (ref 15–500)
Eosinophils Relative: 4.5 %
HCT: 36.4 % (ref 35.0–45.0)
Hemoglobin: 11.6 g/dL — ABNORMAL LOW (ref 11.7–15.5)
MCH: 27 pg (ref 27.0–33.0)
MCHC: 31.9 g/dL — ABNORMAL LOW (ref 32.0–36.0)
MCV: 84.7 fL (ref 80.0–100.0)
MPV: 9.6 fL (ref 7.5–12.5)
Monocytes Relative: 4.7 %
Neutro Abs: 6520 {cells}/uL (ref 1500–7800)
Neutrophils Relative %: 63.3 %
Platelets: 540 Thousand/uL — ABNORMAL HIGH (ref 140–400)
RBC: 4.3 Million/uL (ref 3.80–5.10)
RDW: 13.4 % (ref 11.0–15.0)
Total Lymphocyte: 26.8 %
WBC: 10.3 Thousand/uL (ref 3.8–10.8)

## 2023-12-29 LAB — URINALYSIS, ROUTINE W REFLEX MICROSCOPIC
Bilirubin Urine: NEGATIVE
Glucose, UA: NEGATIVE mg/dL
Hgb urine dipstick: NEGATIVE
Ketones, ur: NEGATIVE mg/dL
Nitrite: NEGATIVE
Protein, ur: NEGATIVE mg/dL
Specific Gravity, Urine: 1.008 (ref 1.005–1.030)
pH: 5 (ref 5.0–8.0)

## 2023-12-29 LAB — CBC
HCT: 38 % (ref 36.0–46.0)
Hemoglobin: 12.1 g/dL (ref 12.0–15.0)
MCH: 27.3 pg (ref 26.0–34.0)
MCHC: 31.8 g/dL (ref 30.0–36.0)
MCV: 85.6 fL (ref 80.0–100.0)
Platelets: 477 K/uL — ABNORMAL HIGH (ref 150–400)
RBC: 4.44 MIL/uL (ref 3.87–5.11)
RDW: 13.7 % (ref 11.5–15.5)
WBC: 11.1 K/uL — ABNORMAL HIGH (ref 4.0–10.5)
nRBC: 0 % (ref 0.0–0.2)

## 2023-12-29 LAB — IRON,TIBC AND FERRITIN PANEL
%SAT: 28 % (ref 16–45)
Ferritin: 162 ng/mL (ref 16–232)
Iron: 128 ug/dL (ref 45–160)
TIBC: 453 ug/dL — ABNORMAL HIGH (ref 250–450)

## 2023-12-29 LAB — LIPASE, BLOOD: Lipase: 53 U/L — ABNORMAL HIGH (ref 11–51)

## 2023-12-29 LAB — GLUCOSE, CAPILLARY: Glucose-Capillary: 114 mg/dL — ABNORMAL HIGH (ref 70–99)

## 2023-12-29 MED ORDER — LACTATED RINGERS IV BOLUS
1000.0000 mL | Freq: Once | INTRAVENOUS | Status: AC
  Administered 2023-12-29: 1000 mL via INTRAVENOUS

## 2023-12-29 MED ORDER — AMITRIPTYLINE HCL 25 MG PO TABS
100.0000 mg | ORAL_TABLET | Freq: Every day | ORAL | Status: DC
  Administered 2023-12-29 – 2023-12-30 (×2): 100 mg via ORAL
  Filled 2023-12-29 (×2): qty 4

## 2023-12-29 MED ORDER — OXYCODONE HCL 5 MG PO TABS
5.0000 mg | ORAL_TABLET | ORAL | Status: DC | PRN
  Administered 2023-12-29: 5 mg via ORAL
  Filled 2023-12-29: qty 1

## 2023-12-29 MED ORDER — TIOTROPIUM BROMIDE MONOHYDRATE 2.5 MCG/ACT IN AERS: 2.0000 | INHALATION_SPRAY | Freq: Every day | RESPIRATORY_TRACT | Status: DC

## 2023-12-29 MED ORDER — LACTATED RINGERS IV SOLN: INTRAVENOUS | Status: AC

## 2023-12-29 MED ORDER — SODIUM BICARBONATE 650 MG PO TABS
1300.0000 mg | ORAL_TABLET | Freq: Three times a day (TID) | ORAL | Status: DC
  Administered 2023-12-29 – 2023-12-31 (×5): 1300 mg via ORAL
  Filled 2023-12-29 (×6): qty 2

## 2023-12-29 MED ORDER — CYANOCOBALAMIN 1000 MCG/ML IJ SOLN: 1000.0000 ug | INTRAMUSCULAR | Status: DC

## 2023-12-29 MED ORDER — ONDANSETRON HCL 4 MG/2ML IJ SOLN
4.0000 mg | Freq: Four times a day (QID) | INTRAMUSCULAR | Status: DC | PRN
  Administered 2023-12-30: 4 mg via INTRAVENOUS
  Filled 2023-12-29: qty 2

## 2023-12-29 MED ORDER — LEVETIRACETAM 500 MG PO TABS
750.0000 mg | ORAL_TABLET | Freq: Two times a day (BID) | ORAL | Status: DC
  Administered 2023-12-29 – 2023-12-31 (×4): 750 mg via ORAL
  Filled 2023-12-29 (×4): qty 1

## 2023-12-29 MED ORDER — LOPERAMIDE HCL 2 MG PO CAPS
4.0000 mg | ORAL_CAPSULE | Freq: Three times a day (TID) | ORAL | Status: AC
  Administered 2023-12-29 – 2023-12-30 (×3): 4 mg via ORAL
  Filled 2023-12-29 (×3): qty 2

## 2023-12-29 MED ORDER — METOPROLOL TARTRATE 25 MG PO TABS
12.5000 mg | ORAL_TABLET | Freq: Two times a day (BID) | ORAL | Status: DC
  Administered 2023-12-29: 12.5 mg via ORAL
  Filled 2023-12-29: qty 1

## 2023-12-29 MED ORDER — MIDODRINE HCL 5 MG PO TABS
5.0000 mg | ORAL_TABLET | Freq: Three times a day (TID) | ORAL | Status: DC
  Administered 2023-12-29: 5 mg via ORAL
  Filled 2023-12-29 (×2): qty 1

## 2023-12-29 MED ORDER — SCOPOLAMINE 1 MG/3DAYS TD PT72
1.0000 | MEDICATED_PATCH | TRANSDERMAL | Status: DC
  Administered 2023-12-31: 1 mg via TRANSDERMAL
  Filled 2023-12-29: qty 1

## 2023-12-29 MED ORDER — ONDANSETRON HCL 4 MG PO TABS: 4.0000 mg | ORAL_TABLET | Freq: Four times a day (QID) | ORAL | Status: DC | PRN

## 2023-12-29 MED ORDER — ENSURE ENLIVE PO LIQD
237.0000 mL | Freq: Two times a day (BID) | ORAL | Status: DC
  Filled 2023-12-29 (×5): qty 237

## 2023-12-29 MED ORDER — FENTANYL CITRATE (PF) 100 MCG/2ML IJ SOLN
50.0000 ug | Freq: Once | INTRAMUSCULAR | Status: AC
  Administered 2023-12-29: 50 ug via INTRAVENOUS
  Filled 2023-12-29: qty 2

## 2023-12-29 MED ORDER — ACETAMINOPHEN 325 MG PO TABS
650.0000 mg | ORAL_TABLET | Freq: Four times a day (QID) | ORAL | Status: DC | PRN
  Administered 2023-12-31 (×2): 650 mg via ORAL
  Filled 2023-12-29 (×2): qty 2

## 2023-12-29 MED ORDER — ALPRAZOLAM 0.5 MG PO TABS
0.5000 mg | ORAL_TABLET | Freq: Two times a day (BID) | ORAL | Status: DC | PRN
  Administered 2023-12-29 – 2023-12-31 (×2): 0.5 mg via ORAL
  Filled 2023-12-29 (×2): qty 1

## 2023-12-29 MED ORDER — CYANOCOBALAMIN 1000 MCG/ML IJ SOLN
1000.0000 ug | Freq: Every day | INTRAMUSCULAR | Status: DC
  Administered 2023-12-29 – 2023-12-31 (×3): 1000 ug via SUBCUTANEOUS
  Filled 2023-12-29 (×3): qty 1

## 2023-12-29 MED ORDER — HEPARIN SODIUM (PORCINE) 5000 UNIT/ML IJ SOLN
5000.0000 [IU] | Freq: Three times a day (TID) | INTRAMUSCULAR | Status: DC
  Administered 2023-12-30 – 2023-12-31 (×4): 5000 [IU] via SUBCUTANEOUS
  Filled 2023-12-29 (×4): qty 1

## 2023-12-29 MED ORDER — ALBUTEROL SULFATE (2.5 MG/3ML) 0.083% IN NEBU: 2.5000 mg | INHALATION_SOLUTION | Freq: Four times a day (QID) | RESPIRATORY_TRACT | Status: DC | PRN

## 2023-12-29 MED ORDER — OFLOXACIN 0.3 % OP SOLN
2.0000 [drp] | OPHTHALMIC | Status: DC
  Administered 2023-12-29 – 2023-12-31 (×9): 2 [drp] via OPHTHALMIC
  Filled 2023-12-29: qty 5

## 2023-12-29 MED ORDER — PANTOPRAZOLE SODIUM 40 MG PO TBEC
40.0000 mg | DELAYED_RELEASE_TABLET | Freq: Two times a day (BID) | ORAL | Status: DC
  Administered 2023-12-29 – 2023-12-31 (×4): 40 mg via ORAL
  Filled 2023-12-29 (×4): qty 1

## 2023-12-29 MED ORDER — UMECLIDINIUM BROMIDE 62.5 MCG/ACT IN AEPB
1.0000 | INHALATION_SPRAY | Freq: Every day | RESPIRATORY_TRACT | Status: DC
  Administered 2023-12-30 – 2023-12-31 (×2): 1 via RESPIRATORY_TRACT
  Filled 2023-12-29: qty 7

## 2023-12-29 MED ORDER — FENTANYL CITRATE PF 50 MCG/ML IJ SOSY
25.0000 ug | PREFILLED_SYRINGE | INTRAMUSCULAR | Status: DC | PRN
  Administered 2023-12-30: 25 ug via INTRAVENOUS
  Administered 2023-12-30: 50 ug via INTRAVENOUS
  Filled 2023-12-29 (×2): qty 1

## 2023-12-29 NOTE — ED Provider Notes (Signed)
 [place] EMERGENCY DEPARTMENT AT [facility] Provider Note   CSN: [id number] Arrival date & time: 12/29/23  1242     Patient presents with: Abnormal Labs (Elevated Creatine)   [name] is a 53 y.o. female.   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  Patient has known history of recurrent issues post ileostomy including infection and excessive drainage.  Patient states that she had a blood draw done yesterday and was told to come into the ER because of renal failure.    Prior to Admission medications   Medication Sig Start Date End Date Taking? Authorizing Provider  albuterol  (ACCUNEB ) 1.25 MG/3ML nebulizer solution Take 1 ampule by nebulization every 6 (six) hours as needed for wheezing or shortness of breath. 04/21/23  Yes [provider]  ALPRAZolam  (XANAX ) 1 MG tablet Take 0.5 tablets (0.5 mg total) by mouth 2 (two) times daily as needed for anxiety. Patient taking differently: Take 1 mg by mouth 3 (three) times daily. 06/28/23  Yes Elgergawy, Brayton RAMAN, MD  amitriptyline  (ELAVIL ) 100 MG tablet Take 100 mg by mouth at bedtime.   Yes [provider]  cetirizine (ZYRTEC) 10 MG tablet Take 10 mg by mouth daily. 01/08/22  Yes [provider]  cyclobenzaprine  (FLEXERIL ) 10 MG tablet Take 10 mg by mouth 3 (three) times daily.   Yes [provider]  feeding supplement (ENSURE ENLIVE / ENSURE PLUS) LIQD Take 237 mLs by mouth 2 (two) times daily between meals. 01/04/22  Yes Ricky Fines, MD  gabapentin  (NEURONTIN ) 300 MG capsule Take 300 mg by mouth daily as needed (for pain). 10/15/22  Yes [provider]  levETIRAcetam  (KEPPRA ) 750 MG tablet Take 750 mg by mouth 2 (two)  times daily. 09/30/20  Yes [provider]  metoprolol  tartrate (LOPRESSOR ) 25 MG tablet Take 0.5 tablets (12.5 mg total) by mouth 2 (two) times daily. Patient taking differently: Take 25-50 mg by mouth See admin instructions. Take 25mg  in the morning and 50mg  at bedtime 04/10/23  Yes Johnson, Clanford L, MD  pantoprazole  (PROTONIX ) 40 MG tablet Take 1 tablet (40 mg total) by mouth 2 (two) times daily. 09/12/23  Yes Carlan, Chelsea L, NP  scopolamine  (TRANSDERM-SCOP) 1 MG/3DAYS Place 1 patch onto the skin every 3 (three) days. As needed   Yes [provider]  SPIRIVA  RESPIMAT 2.5 MCG/ACT AERS Inhale 2 puffs into the lungs daily.   Yes [provider]  triazolam  (HALCION ) 0.25 MG tablet Take 0.25 mg by mouth at bedtime. 10/08/23  Yes [provider]  VENTOLIN  HFA 108 (90 Base) MCG/ACT inhaler Inhale 1 puff into the lungs every 6 (six) hours as needed for wheezing or shortness of breath.   Yes [provider]  nitroGLYCERIN  (NITROSTAT ) 0.4 MG SL tablet Place 1 tablet (0.4 mg total) under the tongue every 5 (five) minutes as needed for chest pain. 04/25/23   Johnson, Clanford L, MD    Allergies: Codeine, Hydrocodone , Ketorolac , Morphine and codeine, Penicillins, Ativan  [lorazepam ], Oxycodone -acetaminophen , Pregabalin , Strawberry extract, Watermelon concentrate, Albuterol , Atarax  [hydroxyzine ], Benadryl  [diphenhydramine  hcl], Humira  [adalimumab ], Latex, Nickel, Remicade  [infliximab ], and Tramadol     Review of Systems  Updated Vital Signs BP 106/62   Pulse 88   Temp 97.7 F (36.5 C) (Oral)   Resp  16   Ht 5' 4.5 (1.638 m)   Wt 57.6 kg   SpO2 100%   BMI 21.46 kg/m   Physical Exam  (all labs ordered are listed, but only abnormal results are displayed) Labs Reviewed  LIPASE, BLOOD - Abnormal; Notable for the following components:      Result Value   Lipase 53 (*)    All other components within normal limits  COMPREHENSIVE METABOLIC PANEL WITH GFR -  Abnormal; Notable for the following components:   Sodium 131 (*)    CO2 16 (*)    BUN 24 (*)    Creatinine, Ser 2.21 (*)    Total Protein 8.4 (*)    Alkaline Phosphatase 145 (*)    GFR, Estimated 26 (*)    Anion gap 16 (*)    All other components within normal limits  CBC - Abnormal; Notable for the following components:   WBC 11.1 (*)    Platelets 477 (*)    All other components within normal limits  URINALYSIS, ROUTINE W REFLEX MICROSCOPIC    EKG: EKG Interpretation Date/Time:  Thursday December 29 2023 14:02:27 EDT Ventricular Rate:  95 PR Interval:  150 QRS Duration:  106 QT Interval:  380 QTC Calculation: 477 R Axis:   109  Text Interpretation: Pulmonary disease pattern Incomplete right bundle branch block Possible Right ventricular hypertrophy Abnormal ECG When compared with ECG of 17-Oct-2023 12:04, PREVIOUS ECG IS PRESENT Confirmed by Charlyn Sora [phone]) on 12/29/2023 3:35:57 PM  Radiology: No results found.   Procedures   Medications Ordered in the ED  lactated ringers  bolus 1,000 mL (1,000 mLs Intravenous New Bag/Given 12/29/23 1712)  lactated ringers  bolus 1,000 mL (1,000 mLs Intravenous New Bag/Given 12/29/23 1713)  fentaNYL  (SUBLIMAZE ) injection 50 mcg (50 mcg Intravenous Given 12/29/23 1724)    Clinical Course as of 12/29/23 1850  Thu Dec 29, 2023  1847 Case discussed with Dr. Cinderella, GI. Discussed with him patient's presenting symptoms, elevated creatinine and per last GI note, discussion about potentially scoping the patient again.  Dr. Cinderella for now recommends that we just admit the patient for AKI, they will see the patient tomorrow.  They would want patient to be NPO. [AN]    Clinical Course User Index [AN] Charlyn Sora, MD                                 Medical Decision Making Amount and/or Complexity of Data Reviewed Labs: ordered.  Risk Prescription drug management. Decision regarding hospitalization.   53 year old patient with  history of C. difficile colitis status post colectomy and ileostomy comes in with chief complaint of abnormal lab.  Patient also has history of diabetes.  On history however, it appears that patient has chronic pain issues, chronic volume issues because of her needing to change her bag 10-15 times a day.  It also appears that whenever she drinks water , it frequently will just gushing to the bag.  Differential diagnosis for her includes short gut syndrome, AKI, severe electrolyte abnormality.  Clinically does not appear that there is any intra-abdominal or abdominal wall infection or SBO.  Repeat labs were ordered given patient had AKI.  I reviewed patient's labs from yesterday, creatinine had jumped up from 1.2-2.88.  Additionally patient's GI note was also reviewed by me.  Her abdominal exam is reassuring.  No need for CT scan.  Reassessment: Labs today show persistent elevated creatinine, however creatinine  is improved.  Still likely will need admission.  Bicarb is 16, there is mild anion gap.  Final diagnoses:  AKI (acute kidney injury) [facility])    ED Discharge Orders     None          Charlyn Sora, MD 12/29/23 1850

## 2023-12-29 NOTE — ED Triage Notes (Signed)
 Pt presents with abnormal labs, (kidney), hypotensive in triage, wears colostomy bag.

## 2023-12-29 NOTE — H&P (Signed)
 [facility] H&P   Patient Demographics:    [name], is a 53 y.o. female  MRN: [id number]   DOB - 05-15-1970  Admit Date - 12/29/2023  Outpatient Primary MD for the patient is Job Browning, PA-C  Referring MD/NP/PA: Dr. Charlyn  Outpatient Specialists: GI Dr Eartha    Patient coming from: Home  Chief Complaint  Patient presents with   Abnormal Labs    Elevated Creatine      HPI:    [name]  is a 53 y.o. 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 difficile infection requiring colectomy 02/11/2023. She has had many hospitalizations in the past, some for high ileostomy output, most recently in June for abdominal wall cellulitis. - Presents to ED secondary to generalized weakness, fatigue, dizziness and lightheadedness, as well she was instructed to do so by her primary care given AKI, reports has been having high ileostomy output, emptying her ileostomy bag up to 10 times a day, has been using Imodium  as needed as well, she was trying to keep with p.o. intake as well. - ED her workup significant for sodium 131, bicarb of 16, creatinine of 2.21 (was 2.8 yesterday), anion gap of 16, white blood cell count of 11.1 blood pressure is soft, so [facility] hospitalist consulted to admit    Review of systems:     A full 10 point Review of Systems was done, except as stated above, all other Review of Systems were negative.   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   Fungemia 02/18/2023   GERD (gastroesophageal reflux disease)    EGD 01/2007 by Dr.Rourke small hiatal hernia s/p 56 french maloney    GI bleed 01/15/2022   History of head injury    Hypertension    IBS (irritable bowel syndrome)    PSVT (paroxysmal supraventricular tachycardia) (HCC)    PTSD (post-traumatic stress disorder)    Pulmonary embolus (HCC) 02/18/2023   Rectal bleeding 01/12/2022   Recurrent chest pain    Rhabdomyolysis 01/16/2021   Seizure disorder (HCC)    Type 2 diabetes mellitus (HCC)    Upper GI bleed 01/27/2023      Past Surgical History:  Procedure Laterality Date   ABDOMINAL HYSTERECTOMY     with right salpingo oophorectomy 2005   APPENDECTOMY  2006   BALLOON DILATION N/A 02/19/2021   Procedure: BALLOON DILATION;  Surgeon: Cindie Carlin POUR, DO;  [facility];  Service: Endoscopy;  Laterality: N/A;  Sigmoid colon stricture   BIOPSY  02/06/2021   Procedure: BIOPSY;  Surgeon: Cindie Carlin POUR, DO;  [facility]: [facility];  Service: Endoscopy;;   BIOPSY  02/19/2021   Procedure: BIOPSY;  Surgeon: Cindie Carlin POUR, DO;  [facility]: [facility];  Service: Endoscopy;;   BIOPSY  07/15/2021   Procedure: BIOPSY;  Surgeon: Golda Claudis PENNER, MD;  [facility]: [facility];  Service: Endoscopy;;   BIOPSY  04/08/2023   Procedure: BIOPSY;  Surgeon: Shaaron Lamar HERO, MD;  [facility];  Service: Endoscopy;;   BIOPSY  06/25/2023   Procedure: BIOPSY;  Surgeon: Federico Rosario BROCKS, MD;  Location: [facility] ENDOSCOPY;  Service: Gastroenterology;;   CESAREAN SECTION     1996   CHOLECYSTECTOMY     COLECTOMY N/A 02/11/2023   Procedure: TOTAL COLECTOMY;  Surgeon: Vanderbilt Ned, MD;  Location: [facility] OR;  Service: General;  Laterality: N/A;   COLONOSCOPY  2010   Dr. Shaaron; negative except for hemorrhoids   COLOSTOMY  02/11/2023   Procedure: COLOSTOMY;  Surgeon: Vanderbilt Ned, MD;  Location: [facility] OR;  Service: General;;    ESOPHAGEAL DILATION N/A 10/25/2014   Procedure: ESOPHAGEAL DILATION;  Surgeon: Claudis PENNER Golda, MD;  [facility]: [facility];  Service: Endoscopy;  Laterality: N/A;   ESOPHAGOGASTRODUODENOSCOPY N/A 10/25/2014   Procedure: ESOPHAGOGASTRODUODENOSCOPY (EGD);  Surgeon: Claudis PENNER Golda, MD;  [facility];  Service: Endoscopy;  Laterality: N/A;  1250   ESOPHAGOGASTRODUODENOSCOPY (EGD) WITH PROPOFOL  N/A 06/25/2023   Procedure: ESOPHAGOGASTRODUODENOSCOPY (EGD) WITH PROPOFOL ;  Surgeon: Federico Rosario BROCKS, MD;  Location: [facility] ENDOSCOPY;  Service: Gastroenterology;  Laterality: N/A;   FLEXIBLE SIGMOIDOSCOPY N/A 02/06/2021   Procedure: FLEXIBLE SIGMOIDOSCOPY;  Surgeon: Cindie Carlin POUR, DO;  [facility];  Service: Endoscopy;  Laterality: N/A;   FLEXIBLE SIGMOIDOSCOPY N/A 02/19/2021   Procedure: FLEXIBLE SIGMOIDOSCOPY;  Surgeon: Cindie Carlin POUR, DO;  [facility];  Service: Endoscopy;  Laterality: N/A;   FLEXIBLE SIGMOIDOSCOPY N/A 02/02/2023   Procedure: FLEXIBLE SIGMOIDOSCOPY;  Surgeon: Albertus Gordy HERO, MD;  Location: [facility] ENDOSCOPY;  Service: Gastroenterology;  Laterality: N/A;   FLEXIBLE SIGMOIDOSCOPY N/A 02/10/2023   Procedure: FLEXIBLE SIGMOIDOSCOPY;  Surgeon: Legrand Victory LITTIE DOUGLAS, MD;  Location: [facility] ENDOSCOPY;  Service: Gastroenterology;  Laterality: N/A;   FLEXIBLE SIGMOIDOSCOPY N/A 04/08/2023   Procedure: FLEXIBLE SIGMOIDOSCOPY;  Surgeon: Shaaron Lamar HERO, MD;  [facility];  Service: Endoscopy;  Laterality: N/A;   IR FLUORO GUIDE CV LINE LEFT  02/21/2023   IR US  GUIDE VASC ACCESS LEFT  02/21/2023   LAPAROTOMY N/A 02/11/2023   Procedure: EXPLORATORY LAPAROTOMY;  Surgeon: Vanderbilt Ned, MD;  Location: [facility] OR;  Service: General;  Laterality: N/A;   OOPHORECTOMY     left for torsion and ovarian fibroma; uterine myoma resected 1995   SAVORY DILATION N/A 06/25/2023   Procedure: SAVORY DILATION;  Surgeon: Federico Rosario BROCKS, MD;  Location: [facility] ENDOSCOPY;  Service: Gastroenterology;   Laterality: N/A;   SIGMOIDOSCOPY  07/15/2021   Procedure: SIGMOIDOSCOPY;  Surgeon: Golda Claudis PENNER, MD;  [facility];  Service: Endoscopy;;   TRANSESOPHAGEAL ECHOCARDIOGRAM (CATH LAB) N/A 02/18/2023   Procedure: TRANSESOPHAGEAL ECHOCARDIOGRAM;  Surgeon: Barbaraann Darryle Ned, MD;  Location: [facility] INVASIVE CV LAB;  Service: Cardiovascular;  Laterality: N/A;      Social History:     Social History   Tobacco Use   Smoking status: Every Day    Current  packs/day: 0.50    Average packs/day: 0.5 packs/day for 39.3 years (19.6 ttl pk-yrs)    Types: Cigarettes    Start date: 09/26/1984    Passive exposure: Current   Smokeless tobacco: Never  Substance Use Topics   Alcohol  use: No    Alcohol /week: 0.0 standard drinks of alcohol         Family History :     Family History  Problem Relation Age of Onset   Cancer Mother    Heart failure Mother    Hypertension Mother    Heart attack Mother    Heart attack Father 31   Cancer Father 37   Heart failure Father    Diabetes Father    Crohn's disease Cousin       Home Medications:   Prior to Admission medications   Medication Sig Start Date End Date Taking? Authorizing Provider  albuterol  (ACCUNEB ) 1.25 MG/3ML nebulizer solution Take 1 ampule by nebulization every 6 (six) hours as needed for wheezing or shortness of breath. 04/21/23  Yes [provider]  ALPRAZolam  (XANAX ) 1 MG tablet Take 0.5 tablets (0.5 mg total) by mouth 2 (two) times daily as needed for anxiety. Patient taking differently: Take 1 mg by mouth 3 (three) times daily. 06/28/23  Yes Brodyn Depuy, Brayton RAMAN, MD  amitriptyline  (ELAVIL ) 100 MG tablet Take 100 mg by mouth at bedtime.   Yes [provider]  cetirizine (ZYRTEC) 10 MG tablet Take 10 mg by mouth daily. 01/08/22  Yes [provider]  cyclobenzaprine  (FLEXERIL ) 10 MG tablet Take 10 mg by mouth 3 (three) times daily.   Yes [provider]  feeding supplement (ENSURE ENLIVE  / ENSURE PLUS) LIQD Take 237 mLs by mouth 2 (two) times daily between meals. 01/04/22  Yes Ricky Fines, MD  gabapentin  (NEURONTIN ) 300 MG capsule Take 300 mg by mouth daily as needed (for pain). 10/15/22  Yes [provider]  levETIRAcetam  (KEPPRA ) 750 MG tablet Take 750 mg by mouth 2 (two) times daily. 09/30/20  Yes [provider]  metoprolol  tartrate (LOPRESSOR ) 25 MG tablet Take 0.5 tablets (12.5 mg total) by mouth 2 (two) times daily. Patient taking differently: Take 25-50 mg by mouth See admin instructions. Take 25mg  in the morning and 50mg  at bedtime 04/10/23  Yes Johnson, Clanford L, MD  pantoprazole  (PROTONIX ) 40 MG tablet Take 1 tablet (40 mg total) by mouth 2 (two) times daily. 09/12/23  Yes Carlan, Chelsea L, NP  scopolamine  (TRANSDERM-SCOP) 1 MG/3DAYS Place 1 patch onto the skin every 3 (three) days. As needed   Yes [provider]  SPIRIVA  RESPIMAT 2.5 MCG/ACT AERS Inhale 2 puffs into the lungs daily.   Yes [provider]  triazolam  (HALCION ) 0.25 MG tablet Take 0.25 mg by mouth at bedtime. 10/08/23  Yes [provider]  VENTOLIN  HFA 108 (90 Base) MCG/ACT inhaler Inhale 1 puff into the lungs every 6 (six) hours as needed for wheezing or shortness of breath.   Yes [provider]  nitroGLYCERIN  (NITROSTAT ) 0.4 MG SL tablet Place 1 tablet (0.4 mg total) under the tongue every 5 (five) minutes as needed for chest pain. 04/25/23   Vicci Afton CROME, MD     Allergies:     Allergies  Allergen Reactions   Codeine Shortness Of Breath, Swelling and Rash    Throat swelling   Hydrocodone  Shortness Of Breath, Swelling and Rash   Ketorolac  Nausea And Vomiting, Nausea Only and Swelling    Pt reports n/v and swelling  to throat   Morphine And Codeine Shortness Of Breath, Swelling and Rash   Penicillins Shortness Of Breath, Swelling and Other (See Comments)    Throat swells 02/06/21--TOLERATES CEFTRIAXONE      Ativan  [Lorazepam ] Other (See  Comments)    Migraines.   Oxycodone -Acetaminophen  Nausea And Vomiting   Pregabalin  Other (See Comments)    Unknown    Strawberry Extract Swelling   Watermelon Concentrate Swelling   Albuterol  Palpitations   Atarax  [Hydroxyzine ] Itching and Swelling    Mildly swollen throat   Benadryl  [Diphenhydramine  Hcl] Palpitations   Humira  [Adalimumab ] Rash   Latex Rash   Nickel Rash   Remicade  [Infliximab ] Rash    Blisters and Welts    Tramadol  Rash    Rash and itching     Physical Exam:   Vitals  Blood pressure 106/62, pulse 88, temperature 97.7 F (36.5 C), temperature source Oral, resp. rate 16, height 5' 4.5 (1.638 m), weight 57.6 kg, SpO2 100%.   1. General Frail, thin appearing, laying in bed, no apparent distress  2. Normal affect and insight, Not Suicidal or Homicidal, Awake Alert, Oriented X 3.  3. No F.N deficits, ALL C.Nerves Intact, Strength 5/5 all 4 extremities, Sensation intact all 4 extremities, Plantars down going.  4.  Right eye conjunctival erythema  5. Supple Neck, No JVD, No cervical lymphadenopathy appriciated, No Carotid Bruits.  6. Symmetrical Chest wall movement, Good air movement bilaterally, CTAB.  7. RRR, No Gallops, Rubs or Murmurs, No Parasternal Heave.  8. Positive Bowel Sounds, Abdomen Soft, ileostomy present, bag is full with liquid light brown stool  9.  No Cyanosis, slow skin Turgor, No Skin Rash or Bruise.  10. Good muscle tone,  joints appear normal , no effusions, Normal ROM.     Data Review:    CBC Recent Labs  Lab 12/28/23 0923 12/29/23 1426  WBC 10.3 11.1*  HGB 11.6* 12.1  HCT 36.4 38.0  PLT 540* 477*  MCV 84.7 85.6  MCH 27.0 27.3  MCHC 31.9* 31.8  RDW 13.4 13.7  EOSABS 464  --   BASOSABS 72  --    ------------------------------------------------------------------------------------------------------------------  Chemistries  Recent Labs  Lab 12/28/23 0923 12/29/23 1426  NA 132* 131*  K 5.2 4.9  CL 101 99  CO2  20 16*  GLUCOSE 81 85  BUN 30* 24*  CREATININE 2.88* 2.21*  CALCIUM  10.4 9.9  AST 26 31  ALT 31* 34  ALKPHOS  --  145*  BILITOT 0.4 0.7   ------------------------------------------------------------------------------------------------------------------ estimated creatinine clearance is 25.7 mL/min (A) (by C-G formula based on SCr of 2.21 mg/dL (H)). ------------------------------------------------------------------------------------------------------------------ No results for input(s): TSH, T4TOTAL, T3FREE, THYROIDAB in the last 72 hours.  Invalid input(s): FREET3  Coagulation profile No results for input(s): INR, PROTIME in the last 168 hours. ------------------------------------------------------------------------------------------------------------------- No results for input(s): DDIMER in the last 72 hours. -------------------------------------------------------------------------------------------------------------------  Cardiac Enzymes No results for input(s): CKMB, TROPONINI, MYOGLOBIN in the last 168 hours.  Invalid input(s): CK ------------------------------------------------------------------------------------------------------------------    Component Value Date/Time   BNP 558.2 (H) 01/27/2023 2352     ---------------------------------------------------------------------------------------------------------------  Urinalysis    Component Value Date/Time   COLORURINE YELLOW 10/17/2023 2005   APPEARANCEUR CLEAR 10/17/2023 2005   LABSPEC 1.010 10/17/2023 2005   PHURINE 5.0 10/17/2023 2005   GLUCOSEU NEGATIVE 10/17/2023 2005   GLUCOSEU NEG mg/dL 91/75/7988 7682   HGBUR NEGATIVE 10/17/2023 2005   BILIRUBINUR NEGATIVE 10/17/2023 2005   KETONESUR NEGATIVE 10/17/2023 2005   PROTEINUR NEGATIVE 10/17/2023 2005  UROBILINOGEN 0.2 12/26/2013 1530   NITRITE NEGATIVE 10/17/2023 2005   LEUKOCYTESUR MODERATE (A) 10/17/2023 2005     ----------------------------------------------------------------------------------------------------------------   Imaging Results:    No results found.   EKG:  Vent. rate 95 BPM PR interval 150 ms QRS duration 106 ms QT/QTcB 380/477 ms P-R-T axes 105 109 118 Pulmonary disease pattern Incomplete right bundle branch block Possible Right ventricular hypertrophy Abnormal ECG When compared with ECG of 17-Oct-2023 12:04, PREVIOUS ECG IS PRESENT   Assessment & Plan:    Principal Problem:   AKI (acute kidney injury) (HCC) Active Problems:   Crohn's disease with complication (HCC)   Essential hypertension   Seizure (HCC)   Hyperlipidemia   Paroxysmal SVT (supraventricular tachycardia) (HCC)   AKI Metabolic acidosis Hyponatremia - Due to hypovolemia and hypotension, in the setting of high ileostomy output - Hold all nephrotoxic medication. - Continue with aggressive IV hydration, and avoid hypotension  High ileostomy output - Will keep on scheduled Imodium  x 3 doses - Continue with IV fluids-continue with scopolamine  patch -Await further recommendation from GI tomorrow, but meanwhile we will keep n.p.o. after midnight as ED discussed with GI for possible scope in a.m. -Will consult wound care for ileostomy care   Crohn's disease with complication (HCC) Status post colectomy/ileostomy - Crohn's disease, chronic diarrhea and chronic abdominal pain  - Management per GI, to see in a.m.   Essential hypertension Hypotension/orthostasis -Apparently patient not on any medication for hypertension as her blood pressure is usually low, she is on metoprolol  mainly for SVT -Used to be on midodrine  in the past for hypotension and orthostasis, her workup included negative concentrical stim test in the past, her blood pressure is soft on presentation, I will start on low-dose midodrine , will encourage to use TED hose as well.   Diabetes mellitus without complication (HCC) Does not  appear to be on any home medications, will monitor CBG and if elevated then will start on insulin  sliding scale   History of pulmonary embolus (PE)/DVT Her DVT was thought to be provoked secondary to PICC line on October 2024, she finished her Eliquis  treatment.,  Currently patient not anymore on anticoagulation    Seizure (HCC) Continue with home dose Keppra   B 12 deficiency - B12 196 in February, mildly improved at 272 in  June, will continue with IM supplement, please ensure to resume IM supplements on discharge  History  of SVT  -Continue with beta-blockers  Right eye conjunctivitis - Some erythema and mild discharge on the right eye, will start on ofloxacin  eyedrops  Anxiety -He is on anxiety medicine, she appears to be mildly somnolent and with low blood pressure so we will hold these meds for now   DVT Prophylaxis Heparin    AM Labs Ordered, also please review Full Orders  Family Communication: Admission, patients condition and plan of care including tests being ordered have been discussed with the patient who indicate understanding and agree with the plan and Code Status.  Code Status Full  Likely DC to  home  Consults called: GI    Admission status: observation    Time spent in minutes : 70 minutes   Brayton Lye M.D on 12/29/2023 at 6:53 PM   [facility] - Office  [phone]

## 2023-12-29 NOTE — Consult Note (Addendum)
 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  Stoma type/location: RMQ ileostomy  Stomal assessment/size:  1 1/8 slightly oval last assessment  Peristomal assessment:  frequent problems with skin breakdown and irritation due to leaking and liquid output  Treatment options for stomal/peristomal skin:   Crust area with stoma powder Soila #6)  and no sting barrier wipes as below; 2 barrier ring  Output  Ostomy pouching: 2 piece 2 1/4 convex pouching system; 2 1/4 convex skin barrier Gerlean #[id number], 2 1/4 pouch Gerlean #[id number] and 2 barrier ring Gerlean [phone]  If stool is liquid can use a high output pouch Gerlean [phone] that will hook to foley bedside drainage bag  Education provided:  patient has been educated extensively on multiple previous admissions  Enrolled patient in [employer] DC program: no, Has Medicaid and last assessment was using [facility]    CRUSTING INSTRUCTIONS: skin barrier will not adhere to wet denuded skin; crusting helps heal skin and provide a clean dry surface for pouching  Sprinkle with stoma  powder ((Lawson #6) over any irritated skin, brush away excess powder Tap lightly over the powder with Cavilon No-Sting barrier wipe (skin barrier wipe-white and blue package) Allow to dry Apply another layer of powder following the same steps up to three layers.  After dry proceed with routine pouching  (place 2 barrier ring around stoma, cut skin barrier to fit size of stoma, use convexity as above)    Thank you,      Kazumi Lachney MSN, RN-BC, CWOCN

## 2023-12-29 NOTE — Telephone Encounter (Signed)
 Christy from [facility] stating that they received a referral for pt. They called pt to schedule appt but pt stated she thought she was getting a home nurse. Per office note, she has had significant issues with her ostomy. She will benefit from education regarding best way to take care of this. I will refer her back to ostomy nurse.  Relayed this information to [place].  Verbalized understanding.

## 2023-12-30 DIAGNOSIS — F4310 Post-traumatic stress disorder, unspecified: Secondary | ICD-10-CM | POA: Diagnosis present

## 2023-12-30 DIAGNOSIS — F419 Anxiety disorder, unspecified: Secondary | ICD-10-CM | POA: Diagnosis present

## 2023-12-30 DIAGNOSIS — I10 Essential (primary) hypertension: Secondary | ICD-10-CM | POA: Diagnosis present

## 2023-12-30 DIAGNOSIS — Z8719 Personal history of other diseases of the digestive system: Secondary | ICD-10-CM

## 2023-12-30 DIAGNOSIS — I951 Orthostatic hypotension: Secondary | ICD-10-CM | POA: Diagnosis present

## 2023-12-30 DIAGNOSIS — G40909 Epilepsy, unspecified, not intractable, without status epilepticus: Secondary | ICD-10-CM | POA: Diagnosis present

## 2023-12-30 DIAGNOSIS — Z932 Ileostomy status: Secondary | ICD-10-CM | POA: Diagnosis not present

## 2023-12-30 DIAGNOSIS — Z8249 Family history of ischemic heart disease and other diseases of the circulatory system: Secondary | ICD-10-CM | POA: Diagnosis not present

## 2023-12-30 DIAGNOSIS — E785 Hyperlipidemia, unspecified: Secondary | ICD-10-CM | POA: Diagnosis present

## 2023-12-30 DIAGNOSIS — E871 Hypo-osmolality and hyponatremia: Secondary | ICD-10-CM | POA: Diagnosis present

## 2023-12-30 DIAGNOSIS — Z8619 Personal history of other infectious and parasitic diseases: Secondary | ICD-10-CM

## 2023-12-30 DIAGNOSIS — I959 Hypotension, unspecified: Secondary | ICD-10-CM

## 2023-12-30 DIAGNOSIS — Z86711 Personal history of pulmonary embolism: Secondary | ICD-10-CM | POA: Diagnosis not present

## 2023-12-30 DIAGNOSIS — F17210 Nicotine dependence, cigarettes, uncomplicated: Secondary | ICD-10-CM | POA: Diagnosis present

## 2023-12-30 DIAGNOSIS — Z833 Family history of diabetes mellitus: Secondary | ICD-10-CM | POA: Diagnosis not present

## 2023-12-30 DIAGNOSIS — G8929 Other chronic pain: Secondary | ICD-10-CM | POA: Diagnosis present

## 2023-12-30 DIAGNOSIS — Z8616 Personal history of COVID-19: Secondary | ICD-10-CM | POA: Diagnosis not present

## 2023-12-30 DIAGNOSIS — I471 Supraventricular tachycardia, unspecified: Secondary | ICD-10-CM | POA: Diagnosis present

## 2023-12-30 DIAGNOSIS — K5010 Crohn's disease of large intestine without complications: Secondary | ICD-10-CM | POA: Diagnosis present

## 2023-12-30 DIAGNOSIS — Z933 Colostomy status: Secondary | ICD-10-CM | POA: Diagnosis not present

## 2023-12-30 DIAGNOSIS — N179 Acute kidney failure, unspecified: Secondary | ICD-10-CM | POA: Diagnosis present

## 2023-12-30 DIAGNOSIS — E538 Deficiency of other specified B group vitamins: Secondary | ICD-10-CM | POA: Diagnosis present

## 2023-12-30 DIAGNOSIS — E861 Hypovolemia: Secondary | ICD-10-CM | POA: Diagnosis present

## 2023-12-30 DIAGNOSIS — E119 Type 2 diabetes mellitus without complications: Secondary | ICD-10-CM | POA: Diagnosis present

## 2023-12-30 DIAGNOSIS — E872 Acidosis, unspecified: Secondary | ICD-10-CM | POA: Diagnosis present

## 2023-12-30 DIAGNOSIS — R571 Hypovolemic shock: Secondary | ICD-10-CM | POA: Diagnosis present

## 2023-12-30 DIAGNOSIS — J4489 Other specified chronic obstructive pulmonary disease: Secondary | ICD-10-CM | POA: Diagnosis present

## 2023-12-30 DIAGNOSIS — H109 Unspecified conjunctivitis: Secondary | ICD-10-CM | POA: Diagnosis present

## 2023-12-30 LAB — C DIFFICILE QUICK SCREEN W PCR REFLEX
C Diff antigen: NEGATIVE
C Diff interpretation: NOT DETECTED
C Diff toxin: NEGATIVE

## 2023-12-30 LAB — RAPID URINE DRUG SCREEN, HOSP PERFORMED
Amphetamines: NOT DETECTED
Barbiturates: NOT DETECTED
Benzodiazepines: POSITIVE — AB
Cocaine: NOT DETECTED
Opiates: NOT DETECTED
Tetrahydrocannabinol: POSITIVE — AB

## 2023-12-30 LAB — CBC
HCT: 26.5 % — ABNORMAL LOW (ref 36.0–46.0)
HCT: 25.2 % — ABNORMAL LOW (ref 36.0–46.0)
Hemoglobin: 8.6 g/dL — ABNORMAL LOW (ref 12.0–15.0)
Hemoglobin: 8 g/dL — ABNORMAL LOW (ref 12.0–15.0)
MCH: 27.5 pg (ref 26.0–34.0)
MCH: 27 pg (ref 26.0–34.0)
MCHC: 32.5 g/dL (ref 30.0–36.0)
MCHC: 31.7 g/dL (ref 30.0–36.0)
MCV: 84.7 fL (ref 80.0–100.0)
MCV: 85.1 fL (ref 80.0–100.0)
Platelets: 356 K/uL (ref 150–400)
Platelets: 348 K/uL (ref 150–400)
RBC: 3.13 MIL/uL — ABNORMAL LOW (ref 3.87–5.11)
RBC: 2.96 MIL/uL — ABNORMAL LOW (ref 3.87–5.11)
RDW: 13.6 % (ref 11.5–15.5)
RDW: 13.6 % (ref 11.5–15.5)
WBC: 8.8 K/uL (ref 4.0–10.5)
WBC: 8.2 K/uL (ref 4.0–10.5)
nRBC: 0 % (ref 0.0–0.2)
nRBC: 0 % (ref 0.0–0.2)

## 2023-12-30 LAB — BASIC METABOLIC PANEL WITH GFR
Anion gap: 10 (ref 5–15)
BUN: 17 mg/dL (ref 6–20)
CO2: 21 mmol/L — ABNORMAL LOW (ref 22–32)
Calcium: 8.5 mg/dL — ABNORMAL LOW (ref 8.9–10.3)
Chloride: 104 mmol/L (ref 98–111)
Creatinine, Ser: 1.69 mg/dL — ABNORMAL HIGH (ref 0.44–1.00)
GFR, Estimated: 36 mL/min — ABNORMAL LOW (ref >60)
Glucose, Bld: 102 mg/dL — ABNORMAL HIGH (ref 70–99)
Potassium: 4 mmol/L (ref 3.5–5.1)
Sodium: 135 mmol/L (ref 135–145)

## 2023-12-30 LAB — HEMOGLOBIN AND HEMATOCRIT, BLOOD
HCT: 28.7 % — ABNORMAL LOW (ref 36.0–46.0)
Hemoglobin: 9 g/dL — ABNORMAL LOW (ref 12.0–15.0)

## 2023-12-30 LAB — GLUCOSE, CAPILLARY
Glucose-Capillary: 92 mg/dL (ref 70–99)
Glucose-Capillary: 98 mg/dL (ref 70–99)
Glucose-Capillary: 113 mg/dL — ABNORMAL HIGH (ref 70–99)
Glucose-Capillary: 91 mg/dL (ref 70–99)

## 2023-12-30 LAB — LACTIC ACID, PLASMA
Lactic Acid, Venous: 2.5 mmol/L (ref 0.5–1.9)
Lactic Acid, Venous: 1.9 mmol/L (ref 0.5–1.9)

## 2023-12-30 LAB — MRSA NEXT GEN BY PCR, NASAL: MRSA by PCR Next Gen: NOT DETECTED

## 2023-12-30 MED ORDER — CHLORHEXIDINE GLUCONATE CLOTH 2 % EX PADS
6.0000 | MEDICATED_PAD | Freq: Every day | CUTANEOUS | Status: DC
  Administered 2023-12-30 – 2023-12-31 (×2): 6 via TOPICAL

## 2023-12-30 MED ORDER — NOREPINEPHRINE 4 MG/250ML-% IV SOLN
0.0000 ug/min | INTRAVENOUS | Status: DC
  Administered 2023-12-30: 2 ug/min via INTRAVENOUS
  Filled 2023-12-30: qty 250

## 2023-12-30 MED ORDER — LACTATED RINGERS IV BOLUS
1000.0000 mL | Freq: Once | INTRAVENOUS | Status: AC
Start: 2023-12-30 — End: 2023-12-30
  Administered 2023-12-30: 1000 mL via INTRAVENOUS

## 2023-12-30 MED ORDER — MIDODRINE HCL 5 MG PO TABS
10.0000 mg | ORAL_TABLET | Freq: Three times a day (TID) | ORAL | Status: DC
  Administered 2023-12-30 – 2023-12-31 (×5): 10 mg via ORAL
  Filled 2023-12-30 (×5): qty 2

## 2023-12-30 MED ORDER — ALBUMIN HUMAN 25 % IV SOLN
25.0000 g | Freq: Once | INTRAVENOUS | Status: AC
  Administered 2023-12-30: 12.5 g via INTRAVENOUS
  Filled 2023-12-30: qty 100

## 2023-12-30 NOTE — Progress Notes (Deleted)
 done

## 2023-12-30 NOTE — Consult Note (Signed)
 Gastroenterology Consult   Referring Provider: No ref. provider found Primary Care Physician:  Job Browning, PA-C Primary Gastroenterologist:  Toribio Rubins, MD   Patient ID: [name]; [id number]; 13-Apr-1971   Admit date: 12/29/2023  LOS: 0 days   Date of Consultation: 12/30/2023  Reason for Consultation:  increased ileostomy output  History of Present Illness   [name] is a 53 y.o. 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 Eartha to receive IV hydration given outpatient blood work with elevated creatinine.  ED Course: Labs -lipase 53, sodium 131, alk phos 145, GFR 26, normal LFTs, creatinine 2.21, hemoglobin 12.1, WBC 11.1 UA with moderate leukocytes and rare bacteria  Consult:  Patient states that for an extended period of time she has been having high output from her ostomy having to empty at least 10 times per day.  In trying to quantify this better she does not describe this well in regards to if bag gets completely full prior to emptying or if she empties a corner of the way full or halfway full etc.  Regardless she reports intermittent fatigue and weakness and at times decreased oral intake.  Continues to state that she does not drink much water  and drinks multiple other liquids that mostly contain caffeine.  Has not had 2 episodes of bleeding from ostomy, used a Q-tip to assess this in the past and states it was coming from the opening and not the peristomal region.  She denies any significant nausea or vomiting.  No laxative use.  Recently seen  last week in the clinic 8/21 with Dr. Eartha.  Note reviewed.  There is mention that in May 2025 she was on PPI twice daily and had butyrate enemas to her pharmacy again but never failed.  Earlier this month on 8/14 she called stating she saw some blood in her ostomy bag was advised to go to the ER for further evaluation.  She reported that ever since she had her ostomy placed she was having to empty her bag 11-12 times per day and trying to drink a fair amount of liquids although she reported this is mostly sweet tea and [facility].  Had seen blood in the ostomy bag twice, stool usually watery in nature and not taking any Imodium  or other antidiarrheals and having recurrent nausea and vomiting episodes with significant dizziness.  Having issues with abdominal soreness and tenderness around the ostomy bag as well as some leaking from this.  No fevers, chills, melena, distention, pain, jaundice or significant weight loss.  Advise increasing her water  intake with drinking Pedialyte or sugar-free Gatorade and advised to stop drinking sodas and sweet tea.  Advised Imodium  1-2 pills daily to decrease ostomy output.  He advised discussion with ostomy nurse for education regarding the care and referral was sent.  CBC, iron panel, CMP, and CRP ordered.  Her labs on 8/27 indicated a hemoglobin of 11.6, WBC 10.3, CRP 3.7, iron 128, saturation 28%, ferritin 162.  Her ALT was 31, BUN was 30 with creatinine 2.88 and GFR 19 as well as sodium 132.  Creatinine was elevated from 1.15-2.88 and BUN elevated so Dr.  Castaneda recommended ED evaluation for hydration.  She also mentioned a recent eye infection.  Admitted to [facility] 10/17/2023 for hypotension and hypovolemic shock.  CT A/P with contrast that admission with moderate amount of residual fecal material in the colon and was treated for abdominal wall cellulitis and IV fluids for hydration.  Summary of endoscopic evaluations below obtained from most recent office visit  note:  Last EGD: 06/2023  - Normal esophagus. Dilated. Biopsied. - Gastritis. Biopsied. - Duodenal erosions with stigmata of recent bleeding.   Flexible sigmoidoscopy 04/08/2023 by Dr. Shaaron - Diffuse proctitis involving the rectal stump benign appearing rectal stricture. Biopsies taken.  - Differential includes active inflammatory bowel disease +/- diversion colitis.  Rectal stump biopsies: Predominant ulcer with granulation tissue and limited colonic mucosa.  Negative for granuloma, dysplasia or malignancy. ( rectal biopsies reveal ulcer nonspecific. 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 Begin short chain fatty acid enema in the way of a single butyrate enema 1 enema nightly x30 days)   Total colectomy 03/13/2023: Path report showed pseudomembranous colitis with mucosal necrosis.  Marked acute and chronic fibrinous serositis.  Mucosal ulceration necrosis with transmural inflammation and chronic cervicitis present in the margins.  2 benign pericolic lymph nodes.   Past Medical History:  Diagnosis Date   Anxiety    Asthma    Chronic low back pain    Collagen vascular disease (HCC)    COPD (chronic obstructive pulmonary disease) (HCC)    COVID-19 virus infection 01/16/2021   Crohn's disease (HCC)    Enteritis due to Clostridium difficile 12/24/2021   Fungal endocarditis 03/28/2023   Fungemia 02/18/2023   GERD (gastroesophageal reflux disease)    EGD 01/2007 by Dr.Rourke small hiatal hernia s/p 56 french maloney    GI bleed 01/15/2022   History of head injury    Hypertension    IBS (irritable bowel syndrome)    PSVT (paroxysmal supraventricular tachycardia) (HCC)    PTSD (post-traumatic stress disorder)    Pulmonary embolus (HCC) 02/18/2023   Rectal bleeding 01/12/2022   Recurrent chest pain    Rhabdomyolysis 01/16/2021   Seizure disorder (HCC)    Type 2 diabetes mellitus (HCC)    Upper GI bleed 01/27/2023    Past Surgical History:  Procedure Laterality Date   ABDOMINAL HYSTERECTOMY      with right salpingo oophorectomy 2005   APPENDECTOMY  2006   BALLOON DILATION N/A 02/19/2021   Procedure: BALLOON DILATION;  Surgeon: Cindie Carlin POUR, DO;  [facility];  Service: Endoscopy;  Laterality: N/A;  Sigmoid colon stricture   BIOPSY  02/06/2021   Procedure: BIOPSY;  Surgeon: Cindie Carlin POUR, DO;  [facility];  Service: Endoscopy;;   BIOPSY  02/19/2021   Procedure: BIOPSY;  Surgeon: Cindie Carlin POUR, DO;  [facility];  Service: Endoscopy;;   BIOPSY  07/15/2021   Procedure: BIOPSY;  Surgeon: Golda Claudis PENNER, MD;  [facility];  Service: Endoscopy;;   BIOPSY  04/08/2023   Procedure: BIOPSY;  Surgeon: Shaaron Lamar HERO, MD;  [facility];  Service: Endoscopy;;   BIOPSY  06/25/2023   Procedure: BIOPSY;  Surgeon: Federico Rosario BROCKS, MD;  Location: [facility] ENDOSCOPY;  Service: Gastroenterology;;   CESAREAN SECTION     1996   CHOLECYSTECTOMY     COLECTOMY N/A 02/11/2023   Procedure: TOTAL COLECTOMY;  Surgeon: Vanderbilt Ned, MD;  Location: [facility];  Service: General;  Laterality: N/A;   COLONOSCOPY  2010   Dr. Shaaron; negative  except for hemorrhoids   COLOSTOMY  02/11/2023   Procedure: COLOSTOMY;  Surgeon: Vanderbilt Ned, MD;  Location: [facility];  Service: General;;   ESOPHAGEAL DILATION N/A 10/25/2014   Procedure: ESOPHAGEAL DILATION;  Surgeon: Claudis RAYMOND Rivet, MD;  [facility];  Service: Endoscopy;  Laterality: N/A;   ESOPHAGOGASTRODUODENOSCOPY N/A 10/25/2014   Procedure: ESOPHAGOGASTRODUODENOSCOPY (EGD);  Surgeon: Claudis RAYMOND Rivet, MD;  [facility];  Service: Endoscopy;  Laterality: N/A;  1250   ESOPHAGOGASTRODUODENOSCOPY (EGD) WITH PROPOFOL  N/A 06/25/2023   Procedure: ESOPHAGOGASTRODUODENOSCOPY (EGD) WITH PROPOFOL ;  Surgeon: Federico Rosario BROCKS, MD;  Location: [facility] ENDOSCOPY;  Service: Gastroenterology;  Laterality: N/A;   FLEXIBLE SIGMOIDOSCOPY N/A 02/06/2021   Procedure: FLEXIBLE SIGMOIDOSCOPY;  Surgeon: Cindie Carlin POUR, DO;  [facility];  Service: Endoscopy;  Laterality: N/A;   FLEXIBLE SIGMOIDOSCOPY N/A 02/19/2021   Procedure: FLEXIBLE SIGMOIDOSCOPY;  Surgeon: Cindie Carlin POUR, DO;  [facility];  Service: Endoscopy;  Laterality: N/A;   FLEXIBLE SIGMOIDOSCOPY N/A 02/02/2023   Procedure: FLEXIBLE SIGMOIDOSCOPY;  Surgeon: Albertus Gordy HERO, MD;  Location[facility] ENDOSCOPY;  Service: Gastroenterology;  Laterality: N/A;   FLEXIBLE SIGMOIDOSCOPY N/A 02/10/2023   Procedure: FLEXIBLE SIGMOIDOSCOPY;  Surgeon: Legrand Victory LITTIE DOUGLAS, MD;  Location: [facility] ENDOSCOPY;  Service: Gastroenterology;  Laterality: N/A;   FLEXIBLE SIGMOIDOSCOPY N/A 04/08/2023   Procedure: FLEXIBLE SIGMOIDOSCOPY;  Surgeon: Shaaron Lamar HERO, MD;  [facility];  Service: Endoscopy;  Laterality: N/A;   IR FLUORO GUIDE CV LINE LEFT  02/21/2023   IR US  GUIDE VASC ACCESS LEFT  02/21/2023   LAPAROTOMY N/A 02/11/2023   Procedure: EXPLORATORY LAPAROTOMY;  Surgeon: Vanderbilt Ned, MD;  Location: [facility];  Service: General;  Laterality: N/A;   OOPHORECTOMY     left for torsion and ovarian fibroma; uterine myoma resected 1995   SAVORY DILATION N/A 06/25/2023   Procedure: SAVORY DILATION;  Surgeon: Federico Rosario BROCKS, MD;  Location: [facility] ENDOSCOPY;  Service: Gastroenterology;  Laterality: N/A;   SIGMOIDOSCOPY  07/15/2021   Procedure: SIGMOIDOSCOPY;  Surgeon: Rivet Claudis RAYMOND, MD;  [facility];  Service: Endoscopy;;   TRANSESOPHAGEAL ECHOCARDIOGRAM (CATH LAB) N/A 02/18/2023   Procedure: TRANSESOPHAGEAL ECHOCARDIOGRAM;  Surgeon: Barbaraann Darryle Ned, MD;  Location: [facility] INVASIVE CV LAB;  Service: Cardiovascular;  Laterality: N/A;    Prior to Admission medications   Medication Sig Start Date End Date Taking? Authorizing Provider  albuterol  (ACCUNEB ) 1.25 MG/3ML nebulizer solution Take 1 ampule by nebulization every 6 (six) hours as needed for wheezing or shortness of breath. 04/21/23  Yes [provider]  ALPRAZolam  (XANAX ) 1  MG tablet Take 0.5 tablets (0.5 mg total) by mouth 2 (two) times daily as needed for anxiety. Patient taking differently: Take 1 mg by mouth 3 (three) times daily. 06/28/23  Yes Elgergawy, Brayton RAMAN, MD  amitriptyline  (ELAVIL ) 100 MG tablet Take 100 mg by mouth at bedtime.   Yes [provider]  cetirizine (ZYRTEC) 10 MG tablet Take 10 mg by mouth daily. 01/08/22  Yes [provider]  cyclobenzaprine  (FLEXERIL ) 10 MG tablet Take 10 mg by mouth 3 (three) times daily.   Yes [provider]  feeding supplement (ENSURE ENLIVE / ENSURE PLUS) LIQD Take 237 mLs by mouth 2 (two) times daily between meals. 01/04/22  Yes Ricky Fines, MD  gabapentin  (NEURONTIN ) 300 MG capsule Take 300 mg by mouth daily as needed (for pain). 10/15/22  Yes [provider]  levETIRAcetam  (KEPPRA ) 750 MG tablet Take 750 mg  by mouth 2 (two) times daily. 09/30/20  Yes [provider]  metoprolol  tartrate (LOPRESSOR ) 25 MG tablet Take 0.5 tablets (12.5 mg total) by mouth 2 (two) times daily. Patient taking differently: Take 25-50 mg by mouth See admin instructions. Take 25mg  in the morning and 50mg  at bedtime 04/10/23  Yes Johnson, Clanford L, MD  pantoprazole  (PROTONIX ) 40 MG tablet Take 1 tablet (40 mg total) by mouth 2 (two) times daily. 09/12/23  Yes Carlan, Chelsea L, NP  scopolamine  (TRANSDERM-SCOP) 1 MG/3DAYS Place 1 patch onto the skin every 3 (three) days. As needed   Yes [provider]  SPIRIVA  RESPIMAT 2.5 MCG/ACT AERS Inhale 2 puffs into the lungs daily.   Yes [provider]  triazolam  (HALCION ) 0.25 MG tablet Take 0.25 mg by mouth at bedtime. 10/08/23  Yes [provider]  VENTOLIN  HFA 108 (90 Base) MCG/ACT inhaler Inhale 1 puff into the lungs every 6 (six) hours as needed for wheezing or shortness of breath.   Yes [provider]  nitroGLYCERIN  (NITROSTAT ) 0.4 MG SL tablet Place 1 tablet (0.4 mg total) under the tongue every 5 (five) minutes as  needed for chest pain. 04/25/23   Vicci Afton CROME, MD    Current Facility-Administered Medications  Medication Dose Route Frequency Provider Last Rate Last Admin   acetaminophen  (TYLENOL ) tablet 650 mg  650 mg Oral Q6H PRN Opyd, Timothy S, MD       albuterol  (PROVENTIL ) (2.5 MG/3ML) 0.083% nebulizer solution 2.5 mg  2.5 mg Nebulization Q6H PRN Elgergawy, Dawood S, MD       ALPRAZolam  (XANAX ) tablet 0.5 mg  0.5 mg Oral BID PRN Elgergawy, Dawood S, MD   0.5 mg at 12/29/23 2213   amitriptyline  (ELAVIL ) tablet 100 mg  100 mg Oral QHS Elgergawy, Dawood S, MD   100 mg at 12/29/23 2212   Chlorhexidine  Gluconate Cloth 2 % PADS 6 each  6 each Topical Q0600 Opyd, Timothy S, MD   6 each at 12/30/23 0419   cyanocobalamin  (VITAMIN B12) injection 1,000 mcg  1,000 mcg Subcutaneous Daily Elgergawy, Dawood S, MD   1,000 mcg at 12/30/23 9166   Followed by   NOREEN ON 01/05/2024] cyanocobalamin  (VITAMIN B12) injection 1,000 mcg  1,000 mcg Subcutaneous Q30 days Elgergawy, Dawood S, MD       feeding supplement (ENSURE ENLIVE / ENSURE PLUS) liquid 237 mL  237 mL Oral BID BM Elgergawy, Dawood S, MD       fentaNYL  (SUBLIMAZE ) injection 25-50 mcg  25-50 mcg Intravenous Q2H PRN Opyd, Timothy S, MD       heparin  injection 5,000 Units  5,000 Units Subcutaneous Q8H Elgergawy, Brayton RAMAN, MD       lactated ringers  infusion   Intravenous Continuous Elgergawy, Brayton RAMAN, MD 125 mL/hr at 12/30/23 0525 New Bag at 12/30/23 0525   levETIRAcetam  (KEPPRA ) tablet 750 mg  750 mg Oral BID Elgergawy, Dawood S, MD   750 mg at 12/30/23 9167   loperamide  (IMODIUM ) capsule 4 mg  4 mg Oral Q8H Elgergawy, Dawood S, MD   4 mg at 12/30/23 0525   midodrine  (PROAMATINE ) tablet 10 mg  10 mg Oral Q8H Opyd, Timothy S, MD   10 mg at 12/30/23 0334   norepinephrine  (LEVOPHED ) 4mg  in (0.016 mg/mL) premix infusion  0-10 mcg/min Intravenous Titrated Maree, Pratik D, DO 7.5 mL/hr at 12/30/23 9177 2 mcg/min at 12/30/23 9177   ofloxacin  (OCUFLOX ) 0.3 %  ophthalmic solution 2 drop  2 drop Right  Eye Q4H Elgergawy, Brayton RAMAN, MD   2 drop at 12/30/23 9167   ondansetron  (ZOFRAN ) tablet 4 mg  4 mg Oral Q6H PRN Elgergawy, Brayton RAMAN, MD       Or   ondansetron  (ZOFRAN ) injection 4 mg  4 mg Intravenous Q6H PRN Elgergawy, Dawood S, MD       oxyCODONE  (Oxy IR/ROXICODONE ) immediate release tablet 5 mg  5 mg Oral Q4H PRN Opyd, Evalene RAMAN, MD   5 mg at 12/29/23 2326   pantoprazole  (PROTONIX ) EC tablet 40 mg  40 mg Oral BID Elgergawy, Dawood S, MD   40 mg at 12/30/23 0832   [START ON 12/31/2023] scopolamine  (TRANSDERM-SCOP) 1 MG/3DAYS 1 mg  1 patch Transdermal Q72H Elgergawy, Brayton RAMAN, MD       sodium bicarbonate  tablet 1,300 mg  1,300 mg Oral TID Elgergawy, Dawood S, MD   1,300 mg at 12/30/23 9167   umeclidinium bromide  (INCRUSE ELLIPTA ) 62.5 MCG/ACT 1 puff  1 puff Inhalation Daily Perley Lum DEL, RPH   1 puff at 12/30/23 0736    Allergies as of 12/29/2023 - Review Complete 12/29/2023  Allergen Reaction Noted   Codeine Shortness Of Breath, Swelling, and Rash    Hydrocodone  Shortness Of Breath, Swelling, and Rash 12/26/2012   Ketorolac  Nausea And Vomiting, Nausea Only, and Swelling 06/18/2022   Morphine and codeine Shortness Of Breath, Swelling, and Rash 12/26/2012   Penicillins Shortness Of Breath, Swelling, and Other (See Comments)    Ativan  [lorazepam ] Other (See Comments) 12/20/2016   Oxycodone -acetaminophen  Nausea And Vomiting 03/22/2023   Pregabalin  Other (See Comments) 06/18/2021   Strawberry extract Swelling 07/22/2011   Watermelon concentrate Swelling 07/22/2011   Albuterol  Palpitations 07/08/2021   Atarax  [hydroxyzine ] Itching and Swelling 02/23/2023   Benadryl  [diphenhydramine  hcl] Palpitations 09/23/2011   Humira  [adalimumab ] Rash 12/24/2021   Latex Rash    Nickel Rash 11/19/2021   Remicade  [infliximab ] Rash 07/08/2021   Tramadol  Rash 01/12/2022    Family History  Problem Relation Age of Onset   Cancer Mother    Heart failure Mother     Hypertension Mother    Heart attack Mother    Heart attack Father 38   Cancer Father 5   Heart failure Father    Diabetes Father    Crohn's disease Cousin     Social History   Socioeconomic History   Marital status: Divorced    Spouse name: Not on file   Number of children: 1   Years of education: Not on file   Highest education level: Not on file  Occupational History   Not on file  Tobacco Use   Smoking status: Every Day    Current packs/day: 0.50    Average packs/day: 0.5 packs/day for 39.3 years (19.6 ttl pk-yrs)    Types: Cigarettes    Start date: 09/26/1984    Passive exposure: Current   Smokeless tobacco: Never  Vaping Use   Vaping status: Never Used  Substance and Sexual Activity   Alcohol  use: No    Alcohol /week: 0.0 standard drinks of alcohol    Drug use: Yes    Types: Marijuana   Sexual activity: Yes    Birth control/protection: Surgical  Other Topics Concern   Not on file  Social History Narrative   Not on file   Social Drivers of Health   Financial Resource Strain: Medium Risk (06/09/2023)   Received from [facility]   Overall Financial Resource Strain (CARDIA)    Difficulty of Paying Living Expenses:  Somewhat hard  Food Insecurity: No Food Insecurity (12/30/2023)   Hunger Vital Sign    Worried About Running Out of Food in the Last Year: Never true    Ran Out of Food in the Last Year: Never true  Transportation Needs: Unmet Transportation Needs (10/17/2023)   PRAPARE - Administrator, Civil Service (Medical): Yes    Lack of Transportation (Non-Medical): Yes  Physical Activity: Inactive (06/09/2023)   Received from [facility]   Exercise Vital Sign    On average, how many days per week do you engage in moderate to strenuous exercise (like a brisk walk)?: 0 days    On average, how many minutes do you engage in exercise at this level?: 0 min  Stress: Stress Concern Present (06/09/2023)   Received from [facility] - Occupational Stress Questionnaire    Feeling of Stress : To some extent  Social Connections: Socially Isolated (06/09/2023)   Received from [facility]   Social Connection and Isolation Panel    In a typical week, how many times do you talk on the phone with family, friends, or neighbors?: Twice a week    How often do you get together with friends or relatives?: Twice a week    How often do you attend church or religious services?: Never    Do you belong to any clubs or organizations such as church groups, unions, fraternal or athletic groups, or school groups?: No    How often do you attend meetings of the clubs or organizations you belong to?: Never    Are you married, widowed, divorced, separated, never married, or living with a partner?: Divorced  Intimate Partner Violence: Not At Risk (10/17/2023)   Humiliation, Afraid, Rape, and Kick questionnaire    Fear of Current or Ex-Partner: No    Emotionally Abused: No    Physically Abused: No    Sexually Abused: No     Review of Systems   Gen: Denies any fever, chills, loss of appetite, change in weight or weight loss CV: Denies chest pain, heart palpitations, syncope, edema  Resp: Denies shortness of breath with rest, cough, wheezing, coughing up blood, and pleurisy.  GI: see HPI Derm: + rash irritation to peristomal region. Denies dry skin, hives. Psych: Denies depression, anxiety, memory loss, hallucinations, and confusion. Heme: Denies bruising or bleeding. + Occasional bloody output from ostomy.  Neuro:  Denies any headaches, dizziness, paresthesias, shaking  Physical Exam   Vital Signs in last 24 hours: Temp:  [97.5 F (36.4 C)-98.1 F (36.7 C)] 97.5 F (36.4 C) (08/29 0740) Pulse Rate:  [71-133] 72 (08/29 0800) Resp:  [14-22] 14 (08/29 0800) BP: (72-123)/(42-93) 107/48 (08/29 0838) SpO2:  [98 %-100 %] 98 % (08/29 0800) Weight:  [57.6 kg-61.4 kg] 61.4 kg (08/29 0415) Last BM Date :  12/30/23  General:   Alert,  pleasant and cooperative in NAD Head:  Normocephalic and atraumatic. Eyes:  Sclera clear, no icterus.   Conjunctiva pink. Ears:  Normal auditory acuity. Mouth:  No deformity or lesions, dentition normal. Neck:  Supple; no masses Abdomen:  Soft, non-distended. Generalized ttp. Scattered scarring noted. Mid right abdomen ileostomy in place. Stoma pink and healthy appearing. Watery green output noted however close to stomal opening she is having thicker more formed/mushy output light green in color. No blood from stoma or in back. External skin irritation noted on abdomen outside of appliance. No masses, hepatosplenomegaly  or hernias noted. Normal bowel sounds, without guarding, and without rebound.  Rectal: deferred Neurologic:  Alert and  oriented x4. Skin:  see abd exam above regarding peristomal skin.  Psych:  Alert and cooperative. Normal mood and affect.  Intake/Output from previous day: 08/28 0701 - 08/29 0700 In: 2890.9 [I.V.:1873.2; IV Piggyback:1017.7] Out: -  Intake/Output this shift: No intake/output data recorded.  Labs/Studies   Recent Labs Recent Labs    12/29/23 1426 12/30/23 0318 12/30/23 0425  WBC 11.1* 8.8 8.2  HGB 12.1 8.6* 8.0*  HCT 38.0 26.5* 25.2*  PLT 477* 356 348   BMET Recent Labs    12/28/23 0923 12/29/23 1426 12/30/23 0318  NA 132* 131* 135  K 5.2 4.9 4.0  CL 101 99 104  CO2 20 16* 21*  GLUCOSE 81 85 102*  BUN 30* 24* 17  CREATININE 2.88* 2.21* 1.69*  CALCIUM  10.4 9.9 8.5*   LFT Recent Labs    12/28/23 0923 12/29/23 1426  PROT 7.2 8.4*  ALBUMIN   --  4.1  AST 26 31  ALT 31* 34  ALKPHOS  --  145*  BILITOT 0.4 0.7   PT/INR No results for input(s): LABPROT, INR in the last 72 hours. Hepatitis Panel No results for input(s): HEPBSAG, HCVAB, HEPAIGM, HEPBIGM in the last 72 hours. C-Diff No results for input(s): CDIFFTOX in the last 72 hours.  Radiology/Studies No results  found.   Assessment   [name] is a 53 y.o. 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 GI consulted to assist in management of high output.   High ileostomy output, hx of cdiff colitis and chrons colitis: Given her history she likely has been experiencing high ileostomy output secondary to short gut syndrome since colectomy.  With her history of C. difficile colitis, her high ileostomy output, and her hypotension it is possible that she could have C. difficile enteritis that has not yet been ruled out.  Had mild white count on 8/28 however today white count normal in the 8 range.  Given her history of Crohn's colitis, progression to small bowel Crohn's remains a potential etiology therefore can consider ileoscopy in the future if no improvement with antidiarrheals but can be performed outpatient unless she has significant drop in Hgb and evidence of overt bleeding from ostomy.   Discussed with her today the importance of regular antidiarrheals given this is likely short gut syndrome.  If Imodium  is not helpful there is options to increase therapy to opioid containing agent such as Lomotil  or tincture of opium.  Could consider Questran in the future as well however may not be as helpful in the setting of complete colectomy.   Needs ongoing ostomy teaching even on discharge.  Had thorough discussion with her today in regards to expected output from ostomy and why the consistency is looser in nature, how often to change, and why she is having the potential rash around her ostomy/stoma.   AKI and hypotension: - high  output likely contributing - adrenal insufficiency has not been ruled out, need to assess fasting/a.m. cortisol and ACTH   Plan / Recommendations   Continue imodium  scheduled 4mg  TID, can increase to QID  if needed. Check for Cdiff enteritis May consider ileoscopy in the future to assess for progression of Chron's into small bowel.  Given her hypotension, dehydration, and AKI will need to rule out adrenal insufficiency.     12/30/2023, 10:21 AM  Charmaine Melia, MSN, FNP-BC, AGACNP-BC [facility]

## 2023-12-30 NOTE — Plan of Care (Signed)

## 2023-12-30 NOTE — Progress Notes (Signed)
 PROGRESS NOTE    [name]  FMW:[id number] DOB: October 30, 1970 DOA: 12/29/2023 PCP: Job Browning, PA-C   Brief Narrative:    [name]  is a 53 y.o. 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 difficile infection requiring colectomy 02/11/2023. She has had many hospitalizations in the past, some for high ileostomy output, most recently in June for abdominal wall cellulitis. - Presents to ED secondary to generalized weakness, fatigue, dizziness and lightheadedness, as well she was instructed to do so by her primary care given AKI, reports has been having high ileostomy output, emptying her ileostomy bag up to 10 times a day, has been using Imodium  as needed as well, she was trying to keep with p.o. intake as well.  Patient was admitted with AKI in the setting of high ileostomy output and has been started on aggressive IV hydration, but is noted to have some persistent hypotension and was transferred to ICU overnight.  She is now on midodrine  and despite this continues to have low blood pressure readings and now has been started on norepinephrine .  Assessment & Plan:   Principal Problem:   AKI (acute kidney injury) (HCC) Active Problems:   Crohn's disease with complication (HCC)   Essential hypertension   Seizure (HCC)   Hyperlipidemia   Paroxysmal SVT (supraventricular tachycardia) (HCC)  Assessment and Plan:  AKI-improving Metabolic acidosis-improving Hyponatremia-resolved - Due to hypovolemia and hypotension, in the setting of high ileostomy output - Hold all nephrotoxic medication.  Worsening hypotension in the setting of prior history of hypotension/orthostasis -Despite aggressive IV fluid bolusing as well as infusion and midodrine  initiation overnight -Started on norepinephrine  on 8/29 -Patient states that at baseline her blood  pressures are around 90/50 -Patient was previously on midodrine  for hypotension and orthostasis, continue midodrine  for now   High ileostomy output - Will keep on scheduled Imodium  x 3 doses - Continue with IV fluids-continue with scopolamine  patch - Appreciate GI evaluation -Will consult wound care for ileostomy care   Crohn's disease with complication (HCC) Status post colectomy/ileostomy - Crohn's disease, chronic diarrhea and chronic abdominal pain  - Management per GI, to see in a.m.    Diabetes mellitus without complication (HCC) Does not appear to be on any home medications, will monitor CBG and if elevated then will start on insulin  sliding scale   History of pulmonary embolus (PE)/DVT Her DVT was thought to be provoked secondary to PICC line on October 2024, she finished her Eliquis  treatment.,  Currently patient not anymore on anticoagulation    Seizure (HCC) Continue with home dose Keppra    B 12 deficiency - B12 196 in February, mildly improved at 272 in  June, will continue with IM supplement, please ensure to resume IM supplements on discharge   History  of SVT  -Continue with beta-blockers   Right eye conjunctivitis - Some erythema and mild discharge on the right eye, will start on ofloxacin  eyedrops   Anxiety -He is on anxiety medicine, she appears to be mildly somnolent and with low blood pressure so we will hold these meds for now   DVT prophylaxis:Heparin  Code Status: Full Family Communication: None at bedside Disposition Plan:  Status is: Inpatient Remains inpatient appropriate because: Need for IV medications.    Consultants:  GI  Procedures:  None  Antimicrobials:  None  Subjective: Patient seen and evaluated today and appears somewhat irritated with all the  questions regarding her blood pressure and possible ingestion of other substances.  She refused to allow staff to check her purse to ensure that there was no concern of illicit drug  use.  Objective: Vitals:   12/30/23 0630 12/30/23 0740 12/30/23 0800 12/30/23 0838  BP: (!) 107/51  (!) 86/46 (!) 107/48  Pulse: 72  72   Resp: 17  14   Temp:  (!) 97.5 F (36.4 C)    TempSrc:  Oral    SpO2: 99%  98%   Weight:      Height:        Intake/Output Summary (Last 24 hours) at 12/30/2023 0947 Last data filed at 12/30/2023 0530 Gross per 24 hour  Intake 2890.86 ml  Output --  Net 2890.86 ml   Filed Weights   12/29/23 1343 12/29/23 1950 12/30/23 0415  Weight: 57.6 kg 59.6 kg 61.4 kg    Examination:  General exam: Appears calm and comfortable  Respiratory system: Clear to auscultation. Respiratory effort normal. Cardiovascular system: S1 & S2 heard, RRR.  Gastrointestinal system: Abdomen is soft, ostomy site C/D/I with brown stool output. Central nervous system: Alert and awake Extremities: No edema Skin: No significant lesions noted Psychiatry: Flat affect.    Data Reviewed: I have personally reviewed following labs and imaging studies  CBC: Recent Labs  Lab 12/28/23 0923 12/29/23 1426 12/30/23 0318 12/30/23 0425  WBC 10.3 11.1* 8.8 8.2  NEUTROABS 6,520  --   --   --   HGB 11.6* 12.1 8.6* 8.0*  HCT 36.4 38.0 26.5* 25.2*  MCV 84.7 85.6 84.7 85.1  PLT 540* 477* 356 348   Basic Metabolic Panel: Recent Labs  Lab 12/28/23 0923 12/29/23 1426 12/30/23 0318  NA 132* 131* 135  K 5.2 4.9 4.0  CL 101 99 104  CO2 20 16* 21*  GLUCOSE 81 85 102*  BUN 30* 24* 17  CREATININE 2.88* 2.21* 1.69*  CALCIUM  10.4 9.9 8.5*   GFR: Estimated Creatinine Clearance: 32.9 mL/min (A) (by C-G formula based on SCr of 1.69 mg/dL (H)). Liver Function Tests: Recent Labs  Lab 12/28/23 0923 12/29/23 1426  AST 26 31  ALT 31* 34  ALKPHOS  --  145*  BILITOT 0.4 0.7  PROT 7.2 8.4*  ALBUMIN   --  4.1   Recent Labs  Lab 12/29/23 1426  LIPASE 53*   No results for input(s): AMMONIA in the last 168 hours. Coagulation Profile: No results for input(s): INR,  PROTIME in the last 168 hours. Cardiac Enzymes: No results for input(s): CKTOTAL, CKMB, CKMBINDEX, TROPONINI in the last 168 hours. BNP (last 3 results) No results for input(s): PROBNP in the last 8760 hours. HbA1C: No results for input(s): HGBA1C in the last 72 hours. CBG: Recent Labs  Lab 12/29/23 2211 12/30/23 0738  GLUCAP 114* 92   Lipid Profile: No results for input(s): CHOL, HDL, LDLCALC, TRIG, CHOLHDL, LDLDIRECT in the last 72 hours. Thyroid  Function Tests: No results for input(s): TSH, T4TOTAL, FREET4, T3FREE, THYROIDAB in the last 72 hours. Anemia Panel: Recent Labs    12/28/23 0923  FERRITIN 162  TIBC 453*  IRON 128   Sepsis Labs: Recent Labs  Lab 12/30/23 0318  LATICACIDVEN 2.5*    No results found for this or any previous visit (from the past 240 hours).       Radiology Studies: No results found.      Scheduled Meds:  amitriptyline   100 mg Oral QHS   Chlorhexidine  Gluconate Cloth  6 each  Topical Q0600   cyanocobalamin   1,000 mcg Subcutaneous Daily   Followed by   NOREEN ON 01/05/2024] cyanocobalamin   1,000 mcg Subcutaneous Q30 days   feeding supplement  237 mL Oral BID BM   heparin   5,000 Units Subcutaneous Q8H   levETIRAcetam   750 mg Oral BID   loperamide   4 mg Oral Q8H   midodrine   10 mg Oral Q8H   ofloxacin   2 drop Right Eye Q4H   pantoprazole   40 mg Oral BID   [START ON 12/31/2023] scopolamine   1 patch Transdermal Q72H   sodium bicarbonate   1,300 mg Oral TID   umeclidinium bromide   1 puff Inhalation Daily   Continuous Infusions:  lactated ringers  125 mL/hr at 12/30/23 0525   norepinephrine  (LEVOPHED ) Adult infusion 2 mcg/min (12/30/23 0822)     LOS: 0 days    Critical care time spent: 55 minutes.    Kaysia Willard JONETTA Fairly, DO [facility]  If 7PM-7AM, please contact night-coverage [email or web address] 12/30/2023, 9:47 AM

## 2023-12-30 NOTE — Plan of Care (Signed)
  Problem: Education: Goal: Knowledge of General Education information will improve Description: Including pain rating scale, medication(s)/side effects and non-pharmacologic comfort measures Outcome: Progressing   Problem: Health Behavior/Discharge Planning: Goal: Ability to manage health-related needs will improve Outcome: Progressing   Problem: Clinical Measurements: Goal: Ability to maintain clinical measurements within normal limits will improve Outcome: Progressing Goal: Will remain free from infection Outcome: Progressing Goal: Diagnostic test results will improve Outcome: Progressing Goal: Cardiovascular complication will be avoided Outcome: Progressing   Problem: Activity: Goal: Risk for activity intolerance will decrease Outcome: Progressing   Problem: Coping: Goal: Level of anxiety will decrease Outcome: Progressing   Problem: Elimination: Goal: Will not experience complications related to bowel motility Outcome: Progressing   Problem: Pain Managment: Goal: General experience of comfort will improve and/or be controlled Outcome: Progressing   Problem: Safety: Goal: Ability to remain free from injury will improve Outcome: Progressing   Problem: Skin Integrity: Goal: Risk for impaired skin integrity will decrease Outcome: Progressing

## 2023-12-30 NOTE — Progress Notes (Signed)
 Patient was seen for hypotension.   SBP has been in the 70s and 80s this morning. She has already received ~4 liters LR and was given 5 mg midodrine  last night. Fortunately, she is asymptomatic.   Plan to transfer to SDU, check chem panel, CBC, and lactate, increase midodrine  to 10 mg q8h with dose now, give 25% albumin , and continue LR infusion.

## 2023-12-30 NOTE — TOC CM/SW Note (Signed)
 Transition of Care [facility]) - Inpatient Brief Assessment   Patient Details  Name: [name] MRN: [id number] Date of Birth: [birth year]  Transition of Care [facility]) CM/SW Contact:    Lucie Lunger, LCSWA Phone Number: 12/30/2023, 8:32 AM   Clinical Narrative: Transition of Care Department [facility]) has reviewed patient and no TOC needs have been identified at this time. We will continue to monitor patient advancement through interdiciplinary progression rounds. If new patient transition needs arise, please place a TOC consult.  Transition of Care Asessment: Insurance and Status: Insurance coverage has been reviewed Patient has primary care physician: Yes Home environment has been reviewed: From home Prior level of function:: Independent Prior/Current Home Services: No current home services Social Drivers of Health Review: SDOH reviewed no interventions necessary Readmission risk has been reviewed: Yes Transition of care needs: no transition of care needs at this time

## 2023-12-30 NOTE — Progress Notes (Signed)
 Critical lab documentation   12/30/23 0537  Provider Notification  Provider Name/Title opyd, T, MD  Date Provider Notified 12/30/23  Time Provider Notified [phone]  Method of Notification Page (secure chat)  Notification Reason Critical Result  Test performed and critical result Lactic acid 2.5  Date Critical Result Received 12/30/23  Provider response Other (Comment)

## 2023-12-31 DIAGNOSIS — Z932 Ileostomy status: Secondary | ICD-10-CM | POA: Diagnosis not present

## 2023-12-31 DIAGNOSIS — N179 Acute kidney failure, unspecified: Secondary | ICD-10-CM | POA: Diagnosis not present

## 2023-12-31 DIAGNOSIS — I959 Hypotension, unspecified: Secondary | ICD-10-CM | POA: Diagnosis not present

## 2023-12-31 LAB — COMPREHENSIVE METABOLIC PANEL WITH GFR
ALT: 22 U/L (ref 0–44)
AST: 21 U/L (ref 15–41)
Albumin: 3 g/dL — ABNORMAL LOW (ref 3.5–5.0)
Alkaline Phosphatase: 91 U/L (ref 38–126)
Anion gap: 8 (ref 5–15)
BUN: 9 mg/dL (ref 6–20)
CO2: 27 mmol/L (ref 22–32)
Calcium: 9.1 mg/dL (ref 8.9–10.3)
Chloride: 106 mmol/L (ref 98–111)
Creatinine, Ser: 1.3 mg/dL — ABNORMAL HIGH (ref 0.44–1.00)
GFR, Estimated: 49 mL/min — ABNORMAL LOW (ref >60)
Glucose, Bld: 98 mg/dL (ref 70–99)
Potassium: 3.9 mmol/L (ref 3.5–5.1)
Sodium: 141 mmol/L (ref 135–145)
Total Bilirubin: 0.7 mg/dL (ref 0.0–1.2)
Total Protein: 5.6 g/dL — ABNORMAL LOW (ref 6.5–8.1)

## 2023-12-31 LAB — CBC
HCT: 28.6 % — ABNORMAL LOW (ref 36.0–46.0)
Hemoglobin: 9 g/dL — ABNORMAL LOW (ref 12.0–15.0)
MCH: 26.6 pg (ref 26.0–34.0)
MCHC: 31.5 g/dL (ref 30.0–36.0)
MCV: 84.6 fL (ref 80.0–100.0)
Platelets: 408 K/uL — ABNORMAL HIGH (ref 150–400)
RBC: 3.38 MIL/uL — ABNORMAL LOW (ref 3.87–5.11)
RDW: 13.8 % (ref 11.5–15.5)
WBC: 8.3 K/uL (ref 4.0–10.5)
nRBC: 0 % (ref 0.0–0.2)

## 2023-12-31 LAB — LACTIC ACID, PLASMA: Lactic Acid, Venous: 1.1 mmol/L (ref 0.5–1.9)

## 2023-12-31 LAB — GLUCOSE, CAPILLARY
Glucose-Capillary: 103 mg/dL — ABNORMAL HIGH (ref 70–99)
Glucose-Capillary: 107 mg/dL — ABNORMAL HIGH (ref 70–99)

## 2023-12-31 LAB — CORTISOL-AM, BLOOD: Cortisol - AM: 8.9 ug/dL (ref 6.7–22.6)

## 2023-12-31 MED ORDER — LOPERAMIDE HCL 2 MG PO TABS: 4.0000 mg | ORAL_TABLET | Freq: Three times a day (TID) | ORAL | 1 refills | Status: DC

## 2023-12-31 MED ORDER — CYANOCOBALAMIN 1000 MCG/ML IJ SOLN: INTRAMUSCULAR | 0 refills | Status: AC

## 2023-12-31 MED ORDER — MIDODRINE HCL 10 MG PO TABS: 10.0000 mg | ORAL_TABLET | Freq: Three times a day (TID) | ORAL | 0 refills | Status: DC

## 2023-12-31 NOTE — Discharge Summary (Signed)
 Physician Discharge Summary  [name] FMW:[id number] DOB: [birth year] DOA: 12/29/2023  PCP: Job Browning, PA-C  Admit date: 12/29/2023  Discharge date: 12/31/2023  Admitted From:Home  Disposition:  Home  Recommendations for Outpatient Follow-up:  Follow up with PCP in 1-2 weeks Remain on midodrine  for blood pressure support as prescribed and follow-up outpatient, no further significant blood pressure drops noted and patient asymptomatic Remain on Imodium  3 times daily as recommended per GI with decrease in ostomy output noted Continue other home medications as prior Follow-up with GI Dr. Eartha with outpatient follow-up to be scheduled per GI  Home Health: None  Equipment/Devices: None  Discharge Condition:Stable  CODE STATUS: Full  Diet recommendation: Heart Healthy/carb modified  Brief/Interim Summary: [name]  is a 53 y.o. 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 difficile infection requiring colectomy 02/11/2023. She has had many hospitalizations in the past, some for high ileostomy output, most recently in June for abdominal wall cellulitis. - Presents to ED secondary to generalized weakness, fatigue, dizziness and lightheadedness, as well she was instructed to do so by her primary care given AKI, reports has been having high ileostomy output, emptying her ileostomy bag up to 10 times a day, has been using Imodium  as needed as well, she was trying to keep with p.o. intake as well.   Patient was admitted with AKI in the setting of high ileostomy output and has been started on aggressive IV hydration, but is noted to have some persistent hypotension and was transferred to ICU overnight on 8/28.  She is now on midodrine  and despite this continues to have low blood pressure readings and now has been started on norepinephrine .  She  continues to have soft blood pressure readings, but is otherwise asymptomatic and there are no signs of organ hypoperfusion.  AKI has improved markedly with creatinine 1.3 and lactic acid levels remain low.  She is asymptomatic with ambulation and does not appear to have any significant orthostasis.  Her ostomy output has decreased markedly and C. difficile testing was negative and cortisol testing was within normal limits.  She will remain on Imodium  3 times daily as recommended per GI to limit her ostomy output.  She will remain on midodrine  on discharge for blood pressure support and will need careful outpatient follow-up with both her PCP and GI.  Discharge Diagnoses:  Principal Problem:   AKI (acute kidney injury) (HCC) Active Problems:   Crohn's disease with complication (HCC)   Essential hypertension   Seizure (HCC)   Hyperlipidemia   Paroxysmal SVT (supraventricular tachycardia) (HCC)  Principal discharge diagnosis: Prerenal AKI with associated metabolic acidosis and hyponatremia in the setting of high ileostomy output.  Discharge Instructions  Discharge Instructions     Diet - low sodium heart healthy   Complete by: As directed    Increase activity slowly   Complete by: As directed       Allergies as of 12/31/2023       Reactions   Codeine Shortness Of Breath, Swelling, Rash   Throat swelling   Hydrocodone  Shortness Of Breath, Swelling, Rash   Ketorolac  Nausea And Vomiting, Nausea Only, Swelling   Pt reports n/v and swelling to throat   Morphine And Codeine Shortness Of Breath, Swelling, Rash   Penicillins Shortness Of Breath, Swelling, Other (See Comments)   Throat swells 02/06/21--TOLERATES CEFTRIAXONE     Ativan  [lorazepam ] Other (See Comments)  Migraines.   Oxycodone -acetaminophen  Nausea And Vomiting   Pregabalin  Other (See Comments)   Unknown    Strawberry Extract Swelling   Watermelon Concentrate Swelling   Albuterol  Palpitations   Atarax  [hydroxyzine ]  Itching, Swelling   Mildly swollen throat   Benadryl  [diphenhydramine  Hcl] Palpitations   Humira  [adalimumab ] Rash   Latex Rash   Nickel Rash   Remicade  [infliximab ] Rash   Blisters and Welts   Tramadol  Rash   Rash and itching        Medication List     STOP taking these medications    metoprolol  tartrate 25 MG tablet Commonly known as: LOPRESSOR        TAKE these medications    Ventolin  HFA 108 (90 Base) MCG/ACT inhaler Generic drug: albuterol  Inhale 1 puff into the lungs every 6 (six) hours as needed for wheezing or shortness of breath.   albuterol  1.25 MG/3ML nebulizer solution Commonly known as: ACCUNEB  Take 1 ampule by nebulization every 6 (six) hours as needed for wheezing or shortness of breath.   ALPRAZolam  1 MG tablet Commonly known as: XANAX  Take 0.5 tablets (0.5 mg total) by mouth 2 (two) times daily as needed for anxiety. What changed:  how much to take when to take this   amitriptyline  100 MG tablet Commonly known as: ELAVIL  Take 100 mg by mouth at bedtime.   cetirizine 10 MG tablet Commonly known as: ZYRTEC Take 10 mg by mouth daily.   cyanocobalamin  1000 MCG/ML injection Commonly known as: VITAMIN B12 Inject 1 mL (1,000 mcg total) into the skin daily for 4 days, THEN 1 mL (1,000 mcg total) every 30 (thirty) days. Start taking on: January 01, 2024   cyclobenzaprine  10 MG tablet Commonly known as: FLEXERIL  Take 10 mg by mouth 3 (three) times daily.   feeding supplement Liqd Take 237 mLs by mouth 2 (two) times daily between meals.   gabapentin  300 MG capsule Commonly known as: NEURONTIN  Take 300 mg by mouth daily as needed (for pain).   levETIRAcetam  750 MG tablet Commonly known as: KEPPRA  Take 750 mg by mouth 2 (two) times daily.   loperamide  2 MG tablet Commonly known as: Imodium  A-D Take 2 tablets (4 mg total) by mouth in the morning, at noon, and at bedtime.   midodrine  10 MG tablet Commonly known as: PROAMATINE  Take 1 tablet  (10 mg total) by mouth every 8 (eight) hours.   nitroGLYCERIN  0.4 MG SL tablet Commonly known as: NITROSTAT  Place 1 tablet (0.4 mg total) under the tongue every 5 (five) minutes as needed for chest pain.   pantoprazole  40 MG tablet Commonly known as: PROTONIX  Take 1 tablet (40 mg total) by mouth 2 (two) times daily.   scopolamine  1 MG/3DAYS Commonly known as: TRANSDERM-SCOP Place 1 patch onto the skin every 3 (three) days. As needed   Spiriva  Respimat 2.5 MCG/ACT Aers Generic drug: Tiotropium Bromide  Monohydrate Inhale 2 puffs into the lungs daily.   triazolam  0.25 MG tablet Commonly known as: HALCION  Take 0.25 mg by mouth at bedtime.        Follow-up Information     Job Browning, [place]. Schedule an appointment as soon as possible for a visit in 1 week(s).   Specialty: Physician Assistant Contact information: [address] [place] [address] [phone]         Eartha Angelia Sieving, MD. Go to.   Specialty: Gastroenterology Contact information: [address] [place] [address] [phone]  Allergies  Allergen Reactions   Codeine Shortness Of Breath, Swelling and Rash    Throat swelling   Hydrocodone  Shortness Of Breath, Swelling and Rash   Ketorolac  Nausea And Vomiting, Nausea Only and Swelling    Pt reports n/v and swelling to throat   Morphine And Codeine Shortness Of Breath, Swelling and Rash   Penicillins Shortness Of Breath, Swelling and Other (See Comments)    Throat swells 02/06/21--TOLERATES CEFTRIAXONE      Ativan  [Lorazepam ] Other (See Comments)    Migraines.   Oxycodone -Acetaminophen  Nausea And Vomiting   Pregabalin  Other (See Comments)    Unknown    Strawberry Extract Swelling   Watermelon Concentrate Swelling   Albuterol  Palpitations   Atarax  [Hydroxyzine ] Itching and Swelling    Mildly swollen throat   Benadryl  [Diphenhydramine  Hcl] Palpitations   Humira  [Adalimumab ] Rash    Latex Rash   Nickel Rash   Remicade  [Infliximab ] Rash    Blisters and Welts    Tramadol  Rash    Rash and itching    Consultations: GI   Procedures/Studies: No results found.   Discharge Exam: Vitals:   12/31/23 0731 12/31/23 1128  BP: (!) 103/47   Pulse: 93   Resp: 18   Temp: 98.1 F (36.7 C) 97.7 F (36.5 C)  SpO2: 96%    Vitals:   12/31/23 0521 12/31/23 0604 12/31/23 0731 12/31/23 1128  BP: (!) 100/48 (!) 94/42 (!) 103/47   Pulse: 99 97 93   Resp: 15 13 18    Temp:   98.1 F (36.7 C) 97.7 F (36.5 C)  TempSrc:   Oral Oral  SpO2: 96% 92% 96%   Weight:      Height:        General: Pt is alert, awake, not in acute distress Cardiovascular: RRR, S1/S2 +, no rubs, no gallops Respiratory: CTA bilaterally, no wheezing, no rhonchi Abdominal: Soft, NT, ND, bowel sounds +, ostomy C/D/I Extremities: no edema, no cyanosis    The results of significant diagnostics from this hospitalization (including imaging, microbiology, ancillary and laboratory) are listed below for reference.     Microbiology: Recent Results (from the past 240 hours)  MRSA Next Gen by PCR, Nasal     Status: None   Collection Time: 12/30/23  5:42 AM   Specimen: Nasal Mucosa; Nasal Swab  Result Value Ref Range Status   MRSA by PCR Next Gen NOT DETECTED NOT DETECTED Final    Comment: (NOTE) The GeneXpert MRSA Assay (FDA approved for NASAL specimens only), is one component of a comprehensive MRSA colonization surveillance program. It is not intended to diagnose MRSA infection nor to guide or monitor treatment for MRSA infections. Test performance is not FDA approved in patients less than 22 years old. Performed at [facility], [address], [place], [place] [address]   C Difficile Quick Screen w PCR reflex     Status: None   Collection Time: 12/30/23  4:25 PM   Specimen: Urine, Clean Catch; Stool  Result Value Ref Range Status   C Diff antigen NEGATIVE NEGATIVE Final   C Diff toxin  NEGATIVE NEGATIVE Final   C Diff interpretation No C. difficile detected.  Final    Comment: Performed at [facility], [address] [place], [place] [address]     Labs: BNP (last 3 results) Recent Labs    01/27/23 2352  BNP 558.2*   Basic Metabolic Panel: Recent Labs  Lab 12/28/23 0923 12/29/23 1426 12/30/23 0318 12/31/23 0351  NA 132* 131*  135 141  K 5.2 4.9 4.0 3.9  CL 101 99 104 106  CO2 20 16* 21* 27  GLUCOSE 81 85 102* 98  BUN 30* 24* 17 9  CREATININE 2.88* 2.21* 1.69* 1.30*  CALCIUM  10.4 9.9 8.5* 9.1   Liver Function Tests: Recent Labs  Lab 12/28/23 0923 12/29/23 1426 12/31/23 0351  AST 26 31 21   ALT 31* 34 22  ALKPHOS  --  145* 91  BILITOT 0.4 0.7 0.7  PROT 7.2 8.4* 5.6*  ALBUMIN   --  4.1 3.0*   Recent Labs  Lab 12/29/23 1426  LIPASE 53*   No results for input(s): AMMONIA in the last 168 hours. CBC: Recent Labs  Lab 12/28/23 0923 12/29/23 1426 12/30/23 0318 12/30/23 0425 12/30/23 0941 12/31/23 0351  WBC 10.3 11.1* 8.8 8.2  --  8.3  NEUTROABS 6,520  --   --   --   --   --   HGB 11.6* 12.1 8.6* 8.0* 9.0* 9.0*  HCT 36.4 38.0 26.5* 25.2* 28.7* 28.6*  MCV 84.7 85.6 84.7 85.1  --  84.6  PLT 540* 477* 356 348  --  408*   Cardiac Enzymes: No results for input(s): CKTOTAL, CKMB, CKMBINDEX, TROPONINI in the last 168 hours. BNP: Invalid input(s): POCBNP CBG: Recent Labs  Lab 12/30/23 1201 12/30/23 1601 12/30/23 2113 12/31/23 0721 12/31/23 1133  GLUCAP 98 113* 91 103* 107*   D-Dimer No results for input(s): DDIMER in the last 72 hours. Hgb A1c No results for input(s): HGBA1C in the last 72 hours. Lipid Profile No results for input(s): CHOL, HDL, LDLCALC, TRIG, CHOLHDL, LDLDIRECT in the last 72 hours. Thyroid  function studies No results for input(s): TSH, T4TOTAL, T3FREE, THYROIDAB in the last 72 hours.  Invalid input(s): FREET3 Anemia work up No results for input(s): VITAMINB12, FOLATE,  FERRITIN, TIBC, IRON, RETICCTPCT in the last 72 hours. Urinalysis    Component Value Date/Time   COLORURINE STRAW (A) 12/29/2023 2000   APPEARANCEUR CLEAR 12/29/2023 2000   LABSPEC 1.008 12/29/2023 2000   PHURINE 5.0 12/29/2023 2000   GLUCOSEU NEGATIVE 12/29/2023 2000   GLUCOSEU NEG mg/dL 91/75/7988 7682   HGBUR NEGATIVE 12/29/2023 2000   BILIRUBINUR NEGATIVE 12/29/2023 2000   KETONESUR NEGATIVE 12/29/2023 2000   PROTEINUR NEGATIVE 12/29/2023 2000   UROBILINOGEN 0.2 12/26/2013 1530   NITRITE NEGATIVE 12/29/2023 2000   LEUKOCYTESUR MODERATE (A) 12/29/2023 2000   Sepsis Labs Recent Labs  Lab 12/29/23 1426 12/30/23 0318 12/30/23 0425 12/31/23 0351  WBC 11.1* 8.8 8.2 8.3   Microbiology Recent Results (from the past 240 hours)  MRSA Next Gen by PCR, Nasal     Status: None   Collection Time: 12/30/23  5:42 AM   Specimen: Nasal Mucosa; Nasal Swab  Result Value Ref Range Status   MRSA by PCR Next Gen NOT DETECTED NOT DETECTED Final    Comment: (NOTE) The GeneXpert MRSA Assay (FDA approved for NASAL specimens only), is one component of a comprehensive MRSA colonization surveillance program. It is not intended to diagnose MRSA infection nor to guide or monitor treatment for MRSA infections. Test performance is not FDA approved in patients less than 25 years old. Performed at [facility], [address], [place], [place] [address]   C Difficile Quick Screen w PCR reflex     Status: None   Collection Time: 12/30/23  4:25 PM   Specimen: Urine, Clean Catch; Stool  Result Value Ref Range Status   C Diff antigen NEGATIVE NEGATIVE Final   C  Diff toxin NEGATIVE NEGATIVE Final   C Diff interpretation No C. difficile detected.  Final    Comment: Performed at [facility], [address] [place], [place] [address]     Time coordinating discharge: 35 minutes  SIGNED:   Adron JONETTA Fairly, DO [facility] 12/31/2023, 3:28 PM  If 7PM-7AM, please contact  night-coverage [email or web address]

## 2023-12-31 NOTE — Plan of Care (Signed)

## 2023-12-31 NOTE — Progress Notes (Signed)
 Reports feeling well this morning.  Tolerating a regular diet.  Off pressors.  Continues on oral midodrine   Staff reports 100 cc out of ostomy so far today.  C. difficile assay negative.  Vital signs in last 24 hours: Temp:  [97.7 F (36.5 C)-98.1 F (36.7 C)] 98.1 F (36.7 C) (08/30 0731) Pulse Rate:  [61-113] 93 (08/30 0731) Resp:  [11-26] 18 (08/30 0731) BP: (91-142)/(35-90) 103/47 (08/30 0731) SpO2:  [88 %-100 %] 96 % (08/30 0731) Last BM Date : 12/31/23 General:   Alert,  Well-developed, well-nourished, pleasant and cooperative in NAD Abdomen:  Soft, nontender and nondistended.  Normal bowel sounds, without guarding, and without rebound.  No mass or organomegaly. Extremities:  Without clubbing or edema.    Intake/Output from previous day: 08/29 0701 - 08/30 0700 In: 265.4 [P.O.:240; I.V.:25.4] Out: 3875 [Urine:3000; Stool:875] Intake/Output this shift: No intake/output data recorded.  Lab Results: Recent Labs    12/30/23 0318 12/30/23 0425 12/30/23 0941 12/31/23 0351  WBC 8.8 8.2  --  8.3  HGB 8.6* 8.0* 9.0* 9.0*  HCT 26.5* 25.2* 28.7* 28.6*  PLT 356 348  --  408*   BMET Recent Labs    12/29/23 1426 12/30/23 0318 12/31/23 0351  NA 131* 135 141  K 4.9 4.0 3.9  CL 99 104 106  CO2 16* 21* 27  GLUCOSE 85 102* 98  BUN 24* 17 9  CREATININE 2.21* 1.69* 1.30*  CALCIUM  9.9 8.5* 9.1   LFT Recent Labs    12/31/23 0351  PROT 5.6*  ALBUMIN  3.0*  AST 21  ALT 22  ALKPHOS 91  BILITOT 0.7   PT/INR No results for input(s): LABPROT, INR in the last 72 hours. Hepatitis Panel No results for input(s): HEPBSAG, HCVAB, HEPAIGM, HEPBIGM in the last 72 hours. 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  Admitted with acute kidney injury secondary to volume contraction due to increased ostomy output.   Transient hypotension requiring pressors as noted.  She seems to do be doing very well this morning on scheduled Imodium ; tolerating diet off pressors.  Ostomy output markedly diminished.   Recommendations:  -Generous oral hydration at home  - Continue scheduled Imodium  (4 mg every 8 hours)  - Follow-up on adrenal insufficiency evaluation  - No need for ileoscopy this admission  - Early interval follow-up with Dr. Eartha regarding all of her GI issues  -From a GI standpoint, it seems she may be dischargeable in the next 24 hours  -I will arrange outpatient follow-up with Dr. Eartha

## 2023-12-31 NOTE — Progress Notes (Signed)
 Notified by RN of borderline hypotension with systolic > 90s but MAP <65. Plan to check a lactate to see what perfusion looks like with these pressures. For now will say goal is systolic > 90. Can resume norepi if systolic drops below that in the meantime. If lactate comes back OK we can continue that target. If its elevated will restart and shoot for a map goal >65   Christine Dawson, MD  [facility]

## 2023-12-31 NOTE — Progress Notes (Signed)
 Went over discharge instructions w/ pt.

## 2023-12-31 NOTE — Progress Notes (Addendum)
 [id number] RN reached out to Dorn Dawson, MD regarding pt soft BP 101/35 (57), pt BP has been fluctuating and drops down during deep sleep. Pt also reports that her BP at baseline is 90/50. Pt doesn't complain of any dizziness or other symptoms. See new orders, MD does not want to restart norepi until labs are resulted.

## 2024-01-13 ENCOUNTER — Telehealth (INDEPENDENT_AMBULATORY_CARE_PROVIDER_SITE_OTHER): Payer: Self-pay | Admitting: Gastroenterology

## 2024-01-13 NOTE — Telephone Encounter (Signed)
 Pt left voicemail stating that she was calling because is not urinating but once a day. Returned call to pt. Pt stated she is only urinating once a day if she is lucky. Advised pt that she would need to reach out to her PCP or kidney doctor if she had one to address the urination issue. Pt states well Dr.Rehman was her doctor. Informed pt that Dr.Rehman was her [place] doctor and we do not deal with urinary issues. Advised pt to call her PCP and let them know what was going on. Pt verbalized understanding.

## 2024-01-24 ENCOUNTER — Ambulatory Visit (HOSPITAL_COMMUNITY)
Admission: RE | Admit: 2024-01-24 | Discharge: 2024-01-24 | Disposition: A | Discharge status: Home / Self Care | Source: Ambulatory Visit | Attending: Nurse Practitioner | Admitting: Nurse Practitioner

## 2024-01-24 DIAGNOSIS — L24B3 Irritant contact dermatitis related to fecal or urinary stoma or fistula: Secondary | ICD-10-CM

## 2024-01-24 DIAGNOSIS — Z932 Ileostomy status: Secondary | ICD-10-CM

## 2024-01-24 NOTE — Progress Notes (Signed)
 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   Fungemia 02/18/2023   GERD (gastroesophageal reflux disease)    EGD 01/2007 by Dr.Rourke small hiatal hernia s/p 56 french maloney    GI bleed 01/15/2022   History of head injury    Hypertension    IBS (irritable bowel syndrome)    PSVT (paroxysmal supraventricular tachycardia)    PTSD (post-traumatic stress disorder)    Pulmonary embolus (HCC) 02/18/2023   Rectal bleeding 01/12/2022   Recurrent chest pain    Rhabdomyolysis 01/16/2021   Seizure disorder (HCC)    Type 2 diabetes mellitus (HCC)    Upper GI bleed 01/27/2023   Family History  Problem Relation Age of Onset   Cancer Mother    Heart failure Mother    Hypertension Mother    Heart attack Mother    Heart attack Father 26   Cancer Father 54   Heart failure Father    Diabetes Father    Crohn's disease Cousin    Allergies  Allergen Reactions   Codeine Shortness Of Breath, Swelling and Rash    Throat swelling   Hydrocodone  Shortness Of Breath, Swelling and Rash   Ketorolac  Nausea And Vomiting, Nausea Only and Swelling    Pt reports n/v and swelling to throat   Morphine And Codeine Shortness Of Breath, Swelling and Rash   Penicillins Shortness Of Breath, Swelling and Other (See Comments)    Throat swells 02/06/21--TOLERATES CEFTRIAXONE      Ativan  [Lorazepam ] Other (See Comments)    Migraines.   Oxycodone -Acetaminophen  Nausea And Vomiting   Pregabalin  Other (See Comments)    Unknown    Strawberry Extract Swelling   Watermelon Concentrate Swelling   Albuterol  Palpitations    Atarax  [Hydroxyzine ] Itching and Swelling    Mildly swollen throat   Benadryl  [Diphenhydramine  Hcl] Palpitations   Humira  [Adalimumab ] Rash   Latex Rash   Nickel Rash   Remicade  [Infliximab ] Rash    Blisters and Welts    Tramadol  Rash    Rash and itching   Current Outpatient Medications  Medication Sig Dispense Refill Last Dose/Taking   albuterol  (ACCUNEB ) 1.25 MG/3ML nebulizer solution Take 1 ampule by nebulization every 6 (six) hours as needed for wheezing or shortness of breath.      ALPRAZolam  (XANAX ) 1 MG tablet Take 0.5 tablets (0.5 mg total) by mouth 2 (two) times daily as needed for anxiety. (Patient taking differently: Take 1 mg by mouth 3 (three) times daily.)      amitriptyline  (ELAVIL ) 100 MG tablet Take 100 mg by mouth at bedtime.      cetirizine (ZYRTEC) 10 MG tablet Take 10 mg by mouth daily.      cyanocobalamin  (VITAMIN B12) 1000 MCG/ML injection Inject 1 mL (1,000 mcg total) into the skin daily for 4 days, THEN 1 mL (1,000 mcg total) every 30 (thirty) days. 10 mL 0    cyclobenzaprine  (FLEXERIL ) 10 MG tablet Take 10 mg by mouth 3 (three) times daily.      feeding supplement (ENSURE ENLIVE / ENSURE PLUS) LIQD Take  237 mLs by mouth 2 (two) times daily between meals.      gabapentin  (NEURONTIN ) 300 MG capsule Take 300 mg by mouth daily as needed (for pain).      levETIRAcetam  (KEPPRA ) 750 MG tablet Take 750 mg by mouth 2 (two) times daily.      loperamide  (IMODIUM  A-D) 2 MG tablet Take 2 tablets (4 mg total) by mouth in the morning, at noon, and at bedtime. 90 tablet 1    midodrine  (PROAMATINE ) 10 MG tablet Take 1 tablet (10 mg total) by mouth every 8 (eight) hours. 270 tablet 0    nitroGLYCERIN  (NITROSTAT ) 0.4 MG SL tablet Place 1 tablet (0.4 mg total) under the tongue every 5 (five) minutes as needed for chest pain. 30 tablet 0    pantoprazole  (PROTONIX ) 40 MG tablet Take 1 tablet (40 mg total) by mouth 2 (two) times daily. 60 tablet 3    scopolamine  (TRANSDERM-SCOP) 1  MG/3DAYS Place 1 patch onto the skin every 3 (three) days. As needed      SPIRIVA  RESPIMAT 2.5 MCG/ACT AERS Inhale 2 puffs into the lungs daily.      triazolam  (HALCION ) 0.25 MG tablet Take 0.25 mg by mouth at bedtime.      VENTOLIN  HFA 108 (90 Base) MCG/ACT inhaler Inhale 1 puff into the lungs every 6 (six) hours as needed for wheezing or shortness of breath.      No current facility-administered medications for this encounter.   ROS  Review of Systems  Constitutional:  Positive for fatigue.  Respiratory:         Hx asthma  Cardiovascular: Negative.   Gastrointestinal:        Crohn's disease IBS Enteritis  Endocrine:       Diabetes mellitus  Psychiatric/Behavioral:  The patient is nervous/anxious.   All other systems reviewed and are negative.  Vital signs:  BP 124/60   Pulse 83   Temp 98.2 F (36.8 C) (Oral)   Resp 18   SpO2 99%  Exam:  Physical Exam Vitals reviewed.  Constitutional:      Appearance: Normal appearance.  HENT:     Mouth/Throat:     Mouth: Mucous membranes are moist.  Cardiovascular:     Rate and Rhythm: Normal rate.  Pulmonary:     Effort: Pulmonary effort is normal.  Abdominal:     Palpations: Abdomen is soft.  Musculoskeletal:        General: Normal range of motion.  Skin:    General: Skin is warm and dry.     Findings: Erythema present.  Neurological:     Mental Status: She is alert and oriented to person, place, and time. Mental status is at baseline.  Psychiatric:        Mood and Affect: Mood normal.        Behavior: Behavior normal.     Stoma type/location:  RMQ ileostomy Stomal assessment/size:  1 3/8 pink and moist Peristomal assessment:  denuded skin to perimeter Treatment options for stomal/peristomal skin: stoma powder and skin prep  barrier ring  Output: liquid brown stool  Ostomy pouching: 2pc.  Convex with barrier ring  Education provided:  Adding ostomy belt to secure pouch and improve wear time.     Impression/dx   Ileostomy Irritant contact dermatitis  Discussion  Add ostomy belt Immodium for loose stool, high output Plan  See back as needed.     Visit time: 55 minutes.   Darice Cooley FNP-BC

## 2024-01-31 DIAGNOSIS — L24B3 Irritant contact dermatitis related to fecal or urinary stoma or fistula: Secondary | ICD-10-CM | POA: Insufficient documentation

## 2024-01-31 DIAGNOSIS — R198 Other specified symptoms and signs involving the digestive system and abdomen: Secondary | ICD-10-CM | POA: Insufficient documentation

## 2024-02-24 ENCOUNTER — Ambulatory Visit (HOSPITAL_COMMUNITY)
Admission: RE | Admit: 2024-02-24 | Discharge: 2024-02-24 | Disposition: A | Discharge status: Home / Self Care | Source: Ambulatory Visit | Attending: Plastic Surgery | Admitting: Plastic Surgery

## 2024-02-24 ENCOUNTER — Other Ambulatory Visit (HOSPITAL_COMMUNITY): Payer: Self-pay | Admitting: Nurse Practitioner

## 2024-02-24 ENCOUNTER — Encounter (HOSPITAL_COMMUNITY): Payer: Self-pay | Admitting: Emergency Medicine

## 2024-02-24 ENCOUNTER — Inpatient Hospital Stay (HOSPITAL_COMMUNITY)
Admission: EM | Admit: 2024-02-24 | Discharge: 2024-02-27 | DRG: 641 | Disposition: A | Discharge status: Home / Self Care

## 2024-02-24 ENCOUNTER — Other Ambulatory Visit: Payer: Self-pay

## 2024-02-24 DIAGNOSIS — R5381 Other malaise: Secondary | ICD-10-CM | POA: Diagnosis present

## 2024-02-24 DIAGNOSIS — R630 Anorexia: Secondary | ICD-10-CM | POA: Diagnosis present

## 2024-02-24 DIAGNOSIS — T65891A Toxic effect of other specified substances, accidental (unintentional), initial encounter: Secondary | ICD-10-CM | POA: Diagnosis not present

## 2024-02-24 DIAGNOSIS — Z433 Encounter for attention to colostomy: Secondary | ICD-10-CM | POA: Diagnosis present

## 2024-02-24 DIAGNOSIS — Z888 Allergy status to other drugs, medicaments and biological substances status: Secondary | ICD-10-CM

## 2024-02-24 DIAGNOSIS — K9400 Colostomy complication, unspecified: Secondary | ICD-10-CM

## 2024-02-24 DIAGNOSIS — E876 Hypokalemia: Secondary | ICD-10-CM | POA: Diagnosis present

## 2024-02-24 DIAGNOSIS — E872 Acidosis, unspecified: Secondary | ICD-10-CM | POA: Diagnosis present

## 2024-02-24 DIAGNOSIS — E860 Dehydration: Secondary | ICD-10-CM | POA: Diagnosis not present

## 2024-02-24 DIAGNOSIS — F419 Anxiety disorder, unspecified: Secondary | ICD-10-CM | POA: Diagnosis present

## 2024-02-24 DIAGNOSIS — G40909 Epilepsy, unspecified, not intractable, without status epilepticus: Secondary | ICD-10-CM | POA: Diagnosis present

## 2024-02-24 DIAGNOSIS — K219 Gastro-esophageal reflux disease without esophagitis: Secondary | ICD-10-CM | POA: Diagnosis present

## 2024-02-24 DIAGNOSIS — N179 Acute kidney failure, unspecified: Secondary | ICD-10-CM | POA: Diagnosis present

## 2024-02-24 DIAGNOSIS — R198 Other specified symptoms and signs involving the digestive system and abdomen: Secondary | ICD-10-CM

## 2024-02-24 DIAGNOSIS — I10 Essential (primary) hypertension: Secondary | ICD-10-CM | POA: Diagnosis present

## 2024-02-24 DIAGNOSIS — E871 Hypo-osmolality and hyponatremia: Secondary | ICD-10-CM | POA: Diagnosis present

## 2024-02-24 DIAGNOSIS — N390 Urinary tract infection, site not specified: Secondary | ICD-10-CM

## 2024-02-24 DIAGNOSIS — Z91040 Latex allergy status: Secondary | ICD-10-CM

## 2024-02-24 DIAGNOSIS — Z9049 Acquired absence of other specified parts of digestive tract: Secondary | ICD-10-CM

## 2024-02-24 DIAGNOSIS — R112 Nausea with vomiting, unspecified: Principal | ICD-10-CM | POA: Diagnosis present

## 2024-02-24 DIAGNOSIS — L24B3 Irritant contact dermatitis related to fecal or urinary stoma or fistula: Secondary | ICD-10-CM

## 2024-02-24 DIAGNOSIS — Z86718 Personal history of other venous thrombosis and embolism: Secondary | ICD-10-CM

## 2024-02-24 DIAGNOSIS — L245 Irritant contact dermatitis due to other chemical products: Secondary | ICD-10-CM | POA: Insufficient documentation

## 2024-02-24 DIAGNOSIS — Z880 Allergy status to penicillin: Secondary | ICD-10-CM

## 2024-02-24 DIAGNOSIS — Z86711 Personal history of pulmonary embolism: Secondary | ICD-10-CM

## 2024-02-24 DIAGNOSIS — L309 Dermatitis, unspecified: Secondary | ICD-10-CM | POA: Diagnosis present

## 2024-02-24 DIAGNOSIS — J449 Chronic obstructive pulmonary disease, unspecified: Secondary | ICD-10-CM | POA: Diagnosis present

## 2024-02-24 DIAGNOSIS — F17200 Nicotine dependence, unspecified, uncomplicated: Secondary | ICD-10-CM | POA: Diagnosis present

## 2024-02-24 DIAGNOSIS — Z91148 Patient's other noncompliance with medication regimen for other reason: Secondary | ICD-10-CM

## 2024-02-24 DIAGNOSIS — Z9889 Other specified postprocedural states: Secondary | ICD-10-CM

## 2024-02-24 DIAGNOSIS — Z9109 Other allergy status, other than to drugs and biological substances: Secondary | ICD-10-CM

## 2024-02-24 DIAGNOSIS — Z885 Allergy status to narcotic agent status: Secondary | ICD-10-CM

## 2024-02-24 DIAGNOSIS — I471 Supraventricular tachycardia, unspecified: Secondary | ICD-10-CM | POA: Diagnosis present

## 2024-02-24 DIAGNOSIS — K5090 Crohn's disease, unspecified, without complications: Secondary | ICD-10-CM | POA: Diagnosis present

## 2024-02-24 DIAGNOSIS — Z79899 Other long term (current) drug therapy: Secondary | ICD-10-CM

## 2024-02-24 DIAGNOSIS — D649 Anemia, unspecified: Secondary | ICD-10-CM | POA: Diagnosis present

## 2024-02-24 DIAGNOSIS — F17210 Nicotine dependence, cigarettes, uncomplicated: Secondary | ICD-10-CM | POA: Diagnosis present

## 2024-02-24 DIAGNOSIS — G894 Chronic pain syndrome: Secondary | ICD-10-CM | POA: Diagnosis present

## 2024-02-24 DIAGNOSIS — I959 Hypotension, unspecified: Secondary | ICD-10-CM | POA: Diagnosis present

## 2024-02-24 DIAGNOSIS — E119 Type 2 diabetes mellitus without complications: Secondary | ICD-10-CM | POA: Diagnosis present

## 2024-02-24 DIAGNOSIS — N3000 Acute cystitis without hematuria: Secondary | ICD-10-CM | POA: Diagnosis present

## 2024-02-24 DIAGNOSIS — T447X6A Underdosing of beta-adrenoreceptor antagonists, initial encounter: Secondary | ICD-10-CM | POA: Diagnosis present

## 2024-02-24 DIAGNOSIS — Z933 Colostomy status: Secondary | ICD-10-CM

## 2024-02-24 DIAGNOSIS — Z91128 Patient's intentional underdosing of medication regimen for other reason: Secondary | ICD-10-CM

## 2024-02-24 DIAGNOSIS — A0471 Enterocolitis due to Clostridium difficile, recurrent: Secondary | ICD-10-CM | POA: Diagnosis present

## 2024-02-24 DIAGNOSIS — Z90710 Acquired absence of both cervix and uterus: Secondary | ICD-10-CM

## 2024-02-24 DIAGNOSIS — J4489 Other specified chronic obstructive pulmonary disease: Secondary | ICD-10-CM | POA: Diagnosis present

## 2024-02-24 DIAGNOSIS — K626 Ulcer of anus and rectum: Secondary | ICD-10-CM | POA: Diagnosis present

## 2024-02-24 LAB — COMPREHENSIVE METABOLIC PANEL WITH GFR
ALT: 27 U/L (ref 0–44)
AST: 24 U/L (ref 15–41)
Albumin: 4.1 g/dL (ref 3.5–5.0)
Alkaline Phosphatase: 126 U/L (ref 38–126)
Anion gap: 14 (ref 5–15)
BUN: 14 mg/dL (ref 6–20)
CO2: 21 mmol/L — ABNORMAL LOW (ref 22–32)
Calcium: 10 mg/dL (ref 8.9–10.3)
Chloride: 98 mmol/L (ref 98–111)
Creatinine, Ser: 1.74 mg/dL — ABNORMAL HIGH (ref 0.44–1.00)
GFR, Estimated: 34 mL/min — ABNORMAL LOW (ref >60)
Glucose, Bld: 101 mg/dL — ABNORMAL HIGH (ref 70–99)
Potassium: 3.8 mmol/L (ref 3.5–5.1)
Sodium: 133 mmol/L — ABNORMAL LOW (ref 135–145)
Total Bilirubin: 0.8 mg/dL (ref 0.0–1.2)
Total Protein: 7.4 g/dL (ref 6.5–8.1)

## 2024-02-24 LAB — URINALYSIS, ROUTINE W REFLEX MICROSCOPIC
Bilirubin Urine: NEGATIVE
Glucose, UA: NEGATIVE mg/dL
Hgb urine dipstick: NEGATIVE
Ketones, ur: NEGATIVE mg/dL
Nitrite: NEGATIVE
Protein, ur: NEGATIVE mg/dL
Specific Gravity, Urine: 1.015 (ref 1.005–1.030)
WBC, UA: >50 WBC/hpf (ref 0–5)
pH: 5 (ref 5.0–8.0)

## 2024-02-24 LAB — CBC
HCT: 37.5 % (ref 36.0–46.0)
Hemoglobin: 12 g/dL (ref 12.0–15.0)
MCH: 26.3 pg (ref 26.0–34.0)
MCHC: 32 g/dL (ref 30.0–36.0)
MCV: 82.1 fL (ref 80.0–100.0)
Platelets: 428 K/uL — ABNORMAL HIGH (ref 150–400)
RBC: 4.57 MIL/uL (ref 3.87–5.11)
RDW: 14.8 % (ref 11.5–15.5)
WBC: 10.9 K/uL — ABNORMAL HIGH (ref 4.0–10.5)
nRBC: 0 % (ref 0.0–0.2)

## 2024-02-24 LAB — BASIC METABOLIC PANEL WITH GFR
Anion gap: 13 (ref 5–15)
BUN: 15 mg/dL (ref 6–20)
CO2: 22 mmol/L (ref 22–32)
Calcium: 10.2 mg/dL (ref 8.9–10.3)
Chloride: 98 mmol/L (ref 98–111)
Creatinine, Ser: 1.88 mg/dL — ABNORMAL HIGH (ref 0.44–1.00)
GFR, Estimated: 31 mL/min — ABNORMAL LOW (ref >60)
Glucose, Bld: 97 mg/dL (ref 70–99)
Potassium: 3.8 mmol/L (ref 3.5–5.1)
Sodium: 133 mmol/L — ABNORMAL LOW (ref 135–145)

## 2024-02-24 LAB — LIPASE, BLOOD: Lipase: 33 U/L (ref 11–51)

## 2024-02-24 NOTE — ED Triage Notes (Signed)
 First Nurse Note: Pt was seen in the colostomy clinic today for check up. After she left they called her and notified her that she had decreased kidney function and was probably dehydrated. They referred her to the ED.

## 2024-02-24 NOTE — Progress Notes (Signed)
 [facility]   Reason for visit:  RMQ colostomy, high output.  Has not been taking Immodium as she feels it makes her stool too thick.  She is experiencing some dry mouth and dizziness.  She is experiencing weakness.  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   Fungemia 02/18/2023   GERD (gastroesophageal reflux disease)    EGD 01/2007 by Dr.Rourke small hiatal hernia s/p 56 french maloney    GI bleed 01/15/2022   History of head injury    Hypertension    IBS (irritable bowel syndrome)    PSVT (paroxysmal supraventricular tachycardia)    PTSD (post-traumatic stress disorder)    Pulmonary embolus (HCC) 02/18/2023   Rectal bleeding 01/12/2022   Recurrent chest pain    Rhabdomyolysis 01/16/2021   Seizure disorder (HCC)    Type 2 diabetes mellitus (HCC)    Upper GI bleed 01/27/2023   Family History  Problem Relation Age of Onset   Cancer Mother    Heart failure Mother    Hypertension Mother    Heart attack Mother    Heart attack Father 43   Cancer Father 27   Heart failure Father    Diabetes Father    Crohn's disease Cousin    Allergies  Allergen Reactions   Codeine Shortness Of Breath, Swelling and Rash    Throat swelling   Hydrocodone  Shortness Of Breath, Swelling and Rash   Ketorolac  Nausea And Vomiting, Nausea Only and Swelling    Pt reports n/v and swelling to throat   Morphine And Codeine Shortness Of Breath, Swelling and Rash   Penicillins Shortness Of Breath, Swelling and Other (See Comments)    Throat swells 02/06/21--TOLERATES CEFTRIAXONE      Ativan  [Lorazepam ] Other (See Comments)    Migraines.    Oxycodone -Acetaminophen  Nausea And Vomiting   Pregabalin  Other (See Comments)    Unknown    Strawberry Extract Swelling   Watermelon Concentrate Swelling   Albuterol  Palpitations   Atarax  [Hydroxyzine ] Itching and Swelling    Mildly swollen throat   Benadryl  [Diphenhydramine  Hcl] Palpitations   Humira  [Adalimumab ] Rash   Latex Rash   Nickel Rash   Remicade  [Infliximab ] Rash    Blisters and Welts    Tramadol  Rash    Rash and itching   Current Outpatient Medications  Medication Sig Dispense Refill Last Dose/Taking   albuterol  (ACCUNEB ) 1.25 MG/3ML nebulizer solution Take 1 ampule by nebulization every 6 (six) hours as needed for wheezing or shortness of breath.      ALPRAZolam  (XANAX ) 1 MG tablet Take 0.5 tablets (0.5 mg total) by mouth 2 (two) times daily as needed for anxiety. (Patient taking differently: Take 1 mg by mouth 3 (three) times daily.)      amitriptyline  (ELAVIL ) 100 MG tablet Take 100 mg by mouth at bedtime.      cetirizine (ZYRTEC) 10 MG tablet Take 10 mg by mouth daily.      cyanocobalamin  (VITAMIN B12) 1000 MCG/ML injection Inject 1 mL (1,000 mcg total) into the skin daily for 4 days, THEN 1 mL (1,000 mcg total) every 30 (thirty) days. 10  mL 0    cyclobenzaprine  (FLEXERIL ) 10 MG tablet Take 10 mg by mouth 3 (three) times daily.      feeding supplement (ENSURE ENLIVE / ENSURE PLUS) LIQD Take 237 mLs by mouth 2 (two) times daily between meals.      gabapentin  (NEURONTIN ) 300 MG capsule Take 300 mg by mouth daily as needed (for pain).      levETIRAcetam  (KEPPRA ) 750 MG tablet Take 750 mg by mouth 2 (two) times daily.      loperamide  (IMODIUM  A-D) 2 MG tablet Take 2 tablets (4 mg total) by mouth in the morning, at noon, and at bedtime. 90 tablet 1    midodrine  (PROAMATINE ) 10 MG tablet Take 1 tablet (10 mg total) by mouth every 8 (eight) hours. 270 tablet 0    nitroGLYCERIN  (NITROSTAT ) 0.4 MG SL tablet Place 1 tablet (0.4 mg total) under the tongue every 5 (five) minutes as  needed for chest pain. 30 tablet 0    pantoprazole  (PROTONIX ) 40 MG tablet Take 1 tablet (40 mg total) by mouth 2 (two) times daily. 60 tablet 3    scopolamine  (TRANSDERM-SCOP) 1 MG/3DAYS Place 1 patch onto the skin every 3 (three) days. As needed      SPIRIVA  RESPIMAT 2.5 MCG/ACT AERS Inhale 2 puffs into the lungs daily.      triazolam  (HALCION ) 0.25 MG tablet Take 0.25 mg by mouth at bedtime.      VENTOLIN  HFA 108 (90 Base) MCG/ACT inhaler Inhale 1 puff into the lungs every 6 (six) hours as needed for wheezing or shortness of breath.      No current facility-administered medications for this encounter.   ROS  Review of Systems  Constitutional:  Positive for activity change and fatigue.  Respiratory: Negative.    Cardiovascular: Negative.        Tachycardia, down to 98 by end of visit.   Gastrointestinal:        Crohn's disease Colostomy  Endocrine: Positive for polydipsia.       Dehydration   Skin:  Positive for rash.       Peristomal breakdown   Neurological:  Positive for light-headedness.  Psychiatric/Behavioral:  Positive for dysphoric mood.   All other systems reviewed and are negative.  Vital signs:  BP (P) 138/71 (BP Location: Right Arm)   Pulse (!) (P) 135   Temp (P) 98.3 F (36.8 C) (Oral)   Resp (P) 18   SpO2 (P) 99%  Exam:  Physical Exam Vitals reviewed.  Constitutional:      Appearance: She is ill-appearing.  HENT:     Mouth/Throat:     Mouth: Mucous membranes are dry.  Cardiovascular:     Rate and Rhythm: Tachycardia present.  Pulmonary:     Effort: Pulmonary effort is normal.  Abdominal:     Palpations: Abdomen is soft.     Comments: Colostomy RMQ with peristomal breakdown   Musculoskeletal:     Comments: weakness  Skin:    General: Skin is dry.     Findings: Rash present.  Neurological:     Mental Status: She is alert and oriented to person, place, and time. Mental status is at baseline.     Motor: Weakness present.  Psychiatric:         Behavior: Behavior normal.     Comments: Anxious about pouch leaks, peristomal skin breakdown      Stoma type/location:  RMQ colostomy with high output and peristomal breakdown Stomal assessment/size:  1 1/4 budded  pink and moist  high output Peristomal assessment:  Denuded skin from 5 to 7 o'clock  Treatment options for stomal/peristomal skin: stoma powder and skin prep to breakdown.  FLonase to perimeter of pouching area where irritation is due to medical adhesive related skin injury  stoma powder and skin prep to breakdown.  Barrier ring and 1 piece convex pouch  stop barrier strips to perimeter, adding tapeless high output 1 piece convex pouch.  Connect to bedside drainage at night.  Samples of pouch and drainage bag provided, along with powder and skin prep  Added ostomy belt for added support.   Output: liquid brown stool  Ostomy pouching: 1pc. Convex high ouput pouch with tapeless backing and belt.   Education provided:  Demonstrated new pouching and provided samples.  Will send RX to [facility] again.  SHe is not established with supplies yet.     Impression/dx  Irritant contact dermatitis High output colostomy Medical adhesive related skin injury Dehydration with suspected AKI.  Discussion  Samples given  Discussed increasing liquids with electrolytes to avoid dehydration.  Recommend Immodium to thickne stool, if daily is too much ,every other day may be enough.  Plan  CMP obtained by lab and will call with results.  If needed, she will report to ED   Late Entry:  AKI and dehydration noted on CMP.  Called patient, no answer.  Recommend going to ED for further assessment.     Visit time: 60 minutes.   Darice Cooley FNP-BC

## 2024-02-24 NOTE — Discharge Instructions (Addendum)
 CMP today Will call with results Switch to 1 piece convex with high output spout Increase fluids with electrolytes.  [facility] PCP with decreased urinary output, dark urine, dry mouth with dizziness.

## 2024-02-24 NOTE — ED Triage Notes (Signed)
 PT was sent from Colostomy clinic due to decreased kidney function and possible dehydration.

## 2024-02-25 ENCOUNTER — Emergency Department (HOSPITAL_COMMUNITY)

## 2024-02-25 DIAGNOSIS — E860 Dehydration: Secondary | ICD-10-CM | POA: Diagnosis not present

## 2024-02-25 DIAGNOSIS — N179 Acute kidney failure, unspecified: Secondary | ICD-10-CM | POA: Diagnosis not present

## 2024-02-25 DIAGNOSIS — Z9049 Acquired absence of other specified parts of digestive tract: Secondary | ICD-10-CM

## 2024-02-25 DIAGNOSIS — Z933 Colostomy status: Secondary | ICD-10-CM

## 2024-02-25 DIAGNOSIS — Z8719 Personal history of other diseases of the digestive system: Secondary | ICD-10-CM

## 2024-02-25 DIAGNOSIS — N3000 Acute cystitis without hematuria: Secondary | ICD-10-CM | POA: Diagnosis not present

## 2024-02-25 LAB — C DIFFICILE QUICK SCREEN W PCR REFLEX
C Diff antigen: NEGATIVE
C Diff interpretation: NOT DETECTED
C Diff toxin: NEGATIVE

## 2024-02-25 MED ORDER — ALPRAZOLAM 0.5 MG PO TABS
0.5000 mg | ORAL_TABLET | Freq: Two times a day (BID) | ORAL | Status: DC | PRN
Start: 2024-02-25 — End: 2024-02-27
  Administered 2024-02-25 – 2024-02-27 (×4): 0.5 mg via ORAL
  Filled 2024-02-25 (×4): qty 1

## 2024-02-25 MED ORDER — SODIUM CHLORIDE 0.9 % IV BOLUS
1000.0000 mL | Freq: Once | INTRAVENOUS | Status: AC
  Administered 2024-02-25: 1000 mL via INTRAVENOUS

## 2024-02-25 MED ORDER — CEFDINIR 300 MG PO CAPS: 300.0000 mg | ORAL_CAPSULE | Freq: Two times a day (BID) | ORAL | 0 refills | Status: AC

## 2024-02-25 MED ORDER — ACETAMINOPHEN 500 MG PO TABS
500.0000 mg | ORAL_TABLET | Freq: Four times a day (QID) | ORAL | Status: DC | PRN
  Administered 2024-02-25 – 2024-02-26 (×4): 500 mg via ORAL
  Filled 2024-02-25 (×4): qty 1

## 2024-02-25 MED ORDER — TRIAZOLAM 0.125 MG PO TABS
0.2500 mg | ORAL_TABLET | Freq: Every day | ORAL | Status: DC
  Administered 2024-02-25 – 2024-02-26 (×2): 0.25 mg via ORAL
  Filled 2024-02-25 (×2): qty 2

## 2024-02-25 MED ORDER — CYCLOBENZAPRINE HCL 10 MG PO TABS
10.0000 mg | ORAL_TABLET | Freq: Three times a day (TID) | ORAL | Status: DC
  Administered 2024-02-25 – 2024-02-27 (×7): 10 mg via ORAL
  Filled 2024-02-25 (×7): qty 1

## 2024-02-25 MED ORDER — ONDANSETRON 4 MG PO TBDP: 4.0000 mg | ORAL_TABLET | Freq: Four times a day (QID) | ORAL | Status: DC | PRN

## 2024-02-25 MED ORDER — TIOTROPIUM BROMIDE 2.5 MCG/ACT IN AERS
2.0000 | INHALATION_SPRAY | Freq: Every day | RESPIRATORY_TRACT | Status: DC
Start: 2024-02-25 — End: 2024-02-25

## 2024-02-25 MED ORDER — UMECLIDINIUM BROMIDE 62.5 MCG/ACT IN AEPB
1.0000 | INHALATION_SPRAY | Freq: Every day | RESPIRATORY_TRACT | Status: DC
  Administered 2024-02-26 – 2024-02-27 (×2): 1 via RESPIRATORY_TRACT
  Filled 2024-02-25: qty 7

## 2024-02-25 MED ORDER — TRAZODONE HCL 50 MG PO TABS: 50.0000 mg | ORAL_TABLET | Freq: Every day | ORAL | Status: DC

## 2024-02-25 MED ORDER — AMITRIPTYLINE HCL 50 MG PO TABS
100.0000 mg | ORAL_TABLET | Freq: Every day | ORAL | Status: DC
  Administered 2024-02-25 – 2024-02-26 (×2): 100 mg via ORAL
  Filled 2024-02-25 (×2): qty 2

## 2024-02-25 MED ORDER — LOPERAMIDE HCL 2 MG PO CAPS: 4.0000 mg | ORAL_CAPSULE | ORAL | Status: DC | PRN

## 2024-02-25 MED ORDER — NICOTINE 7 MG/24HR TD PT24
7.0000 mg | MEDICATED_PATCH | Freq: Every day | TRANSDERMAL | Status: DC
  Filled 2024-02-25 (×3): qty 1

## 2024-02-25 MED ORDER — LOPERAMIDE HCL 2 MG PO CAPS: 4.0000 mg | ORAL_CAPSULE | Freq: Two times a day (BID) | ORAL | Status: DC

## 2024-02-25 MED ORDER — LOPERAMIDE HCL 2 MG PO TABS: 4.0000 mg | ORAL_TABLET | ORAL | Status: DC | PRN

## 2024-02-25 MED ORDER — SODIUM CHLORIDE 0.9 % IV SOLN
1.0000 g | Freq: Every day | INTRAVENOUS | Status: AC
  Administered 2024-02-26 – 2024-02-27 (×2): 1 g via INTRAVENOUS
  Filled 2024-02-25 (×2): qty 10

## 2024-02-25 MED ORDER — GABAPENTIN 300 MG PO CAPS
300.0000 mg | ORAL_CAPSULE | Freq: Every day | ORAL | Status: DC | PRN
  Administered 2024-02-26: 300 mg via ORAL
  Filled 2024-02-25: qty 1

## 2024-02-25 MED ORDER — RIVAROXABAN 10 MG PO TABS
10.0000 mg | ORAL_TABLET | Freq: Every day | ORAL | Status: DC
  Administered 2024-02-25 – 2024-02-27 (×3): 10 mg via ORAL
  Filled 2024-02-25 (×3): qty 1

## 2024-02-25 MED ORDER — TRIMETHOBENZAMIDE HCL 100 MG/ML IM SOLN: 200.0000 mg | Freq: Three times a day (TID) | INTRAMUSCULAR | Status: DC | PRN

## 2024-02-25 MED ORDER — PSYLLIUM 95 % PO PACK
1.0000 | PACK | Freq: Every day | ORAL | Status: DC
  Administered 2024-02-26: 1 via ORAL
  Filled 2024-02-25 (×2): qty 1

## 2024-02-25 MED ORDER — LEVETIRACETAM 500 MG PO TABS
750.0000 mg | ORAL_TABLET | Freq: Two times a day (BID) | ORAL | Status: DC
  Administered 2024-02-25 – 2024-02-27 (×5): 750 mg via ORAL
  Filled 2024-02-25 (×5): qty 1

## 2024-02-25 MED ORDER — SODIUM CHLORIDE 0.9 % IV SOLN
2.0000 g | Freq: Once | INTRAVENOUS | Status: AC
  Administered 2024-02-25: 2 g via INTRAVENOUS
  Filled 2024-02-25: qty 20

## 2024-02-25 MED ORDER — PANTOPRAZOLE SODIUM 40 MG PO TBEC
40.0000 mg | DELAYED_RELEASE_TABLET | Freq: Two times a day (BID) | ORAL | Status: DC
  Administered 2024-02-25 – 2024-02-27 (×5): 40 mg via ORAL
  Filled 2024-02-25 (×5): qty 1

## 2024-02-25 MED ORDER — SCOPOLAMINE 1 MG/3DAYS TD PT72
1.0000 | MEDICATED_PATCH | TRANSDERMAL | Status: DC
  Administered 2024-02-25: 1 mg via TRANSDERMAL
  Filled 2024-02-25: qty 1

## 2024-02-25 MED ORDER — ONDANSETRON 4 MG PO TBDP: 4.0000 mg | ORAL_TABLET | Freq: Three times a day (TID) | ORAL | 0 refills | Status: AC | PRN

## 2024-02-25 MED ORDER — ONDANSETRON HCL 4 MG/2ML IJ SOLN
4.0000 mg | Freq: Once | INTRAMUSCULAR | Status: AC
  Administered 2024-02-25: 4 mg via INTRAVENOUS
  Filled 2024-02-25: qty 2

## 2024-02-25 NOTE — Plan of Care (Signed)

## 2024-02-25 NOTE — ED Provider Notes (Signed)
   ED Course / MDM   Clinical Course as of 02/25/24 1004  Sat Feb 25, 2024  1003 Received signout from Dr. Raford pending reassessment after fluids nausea medicine.  Patient presenting with concern for dehydration, also found to have UTI.  Labs with AKI.  Patient failed p.o. trial after fluids and nausea medicine.  Blood pressure is borderline.  Will give additional fluids.  Patient will need to be admitted for further care.  Discussed with internal medicine teaching service who will admit patient. [WS]    Clinical Course User Index [WS] Francesca Elsie CROME, MD   Medical Decision Making Amount and/or Complexity of Data Reviewed Labs: ordered. Radiology: ordered.  Risk Prescription drug management. Decision regarding hospitalization.          Francesca Elsie CROME, MD 02/25/24 1004

## 2024-02-25 NOTE — Hospital Course (Signed)
 [name] is a 53 y.o. 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 She was dry on initial presentation and had lightheadedness on standing. She also hadn't had the urge to pee in a long time. Patient says she's had high ostomy output as well. She is supposed to be taking imodium  three times daily but doesn't because it clogs up the stoma. Per chart, has several hospitalizations for similar presentation. On 10/24, Na 133 CO2 21 GFR 34, otherwise wnl. Mg 1.2. CRP wnl, ESR 23. C diff negative. Suspect increased RMQ ileostomy output is due to a viral gastroenteritis vs med noncompliance with imodium  vs Crohns flare. Suspect patient's AKI is predominantly prerenal due to dehydration and anorexia. She was given 3 L on day of admission with improvement in Cr to __. Gave 2 mg Mg IV with recheck Mg ___. Patient ambulated well with PT on day of discharge and they had no recommendations.  -check orthostatics  -Psyllium for large stool output   Complicated Acute Cystitis  Presented with feelings of urge without successful void. Additionally had pain in BL groin that radiates to her vagina with BL lower back pain. CTAP today w/o signs of pyelonephritis. UA 10/24 with leukocytes and many bacteria, lab error obtaining culture. Will repeat on day of discharge. Will complete 3 day course of antibiotics. Completed two days of IV CTX in hospital. On day of discharge, lower abdominal and back pain had improved. Patient understands to complete one more day of PO abx tomorrow.     Leukocytosis, resolved 10.9>8.8 today. Other cell lines dropped as well. Initial leukocytosis likely 2/2 hemoconcentration that has now resolved with 3 L NS.   Normocytic Anemia Hgb 9.4 today. Appears to be her baseline. Discharge Hgb 9 from 12/2023 admission. She did have a bloody BM per rectum last night. Per chart, has a hx of rectal bleeding. 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 Recommended therapy is short-chain fatty acid enemas, alternative is mesalamine  enemas 4g nightly for 4 weeks. Hgb stable. Recommend GI follow up.    Crohns Disease Recurrent C. Diff Per chart, Underwent total colectomy leaving about 15 cm of rectal stump. Permanent ileostomy created.  Postop course complicated by fungal bacteremia. She has an appointment with [facility] this coming Friday for her ostomy bag. Previously on skyrizi, d/c after colectomy, not currently on medication for Crohns.    Seizure Disorder Can't remember the last time she's had a seizure. Compliant with medications. Did not have a seizure or signs of seizure during admission. Home Keppra  750 mg BID.    PSVT HTN EKG 10/25 in [facility]. Not having chest pain during encounter. Held home metoprolol  25 mg while BP low on presentation, will resume on discharge. Per chart, has been hospitalized several times in the last year with low pressures. Has midodrine  10 mg Q8HR but she doesn't take it because it makes her feel hot and her face turn red. Told her to try taking it when she feels dizzy to see if it helps, as  opposed to taking TID. Patient understands.    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 Not currently on anticoagulation, per chart, it was last prescribed in 06/2023 and that she completed course for provoked clot. While in hospital, on prophylaxis with rivaroxaban 10 mg. She was tachycardic at times but no signs of DVT, saturating high  on room air. Suspect tachy due to presenting hypovolemia.   COPD Continue home incruse ellipta    GERD EDG with dilation in 06/2023. Continue home pantoprazole  40 mg BID   Chronic pain syndrome Continue home gabapentin  300 mg, cyclobenzaprine  10 mg TID   Anxiety Continue home alprazolam  0.5 mg BID and triazolam  0.25 mg at bedtime

## 2024-02-25 NOTE — H&P (Signed)
 Date: 02/25/2024               Patient Name:  [name] MRN: [id number]  DOB: November 11, 1970 Age / Sex: 53 y.o., female   PCP: Job Browning, PA-C         Medical Service: Internal Medicine Teaching Service         Attending Physician: Dr. Shawn Sick, MD      First Contact: Intern on Call: [phone]       Second Contact: Resident on Call:[phone]                    SUBJECTIVE   Chief Complaint: Dehydration vomiting and nausea  History of Present Illness:   At her wound care appointment yesterday where she reported feeling dizzy and having a dry mouth with high RLQ ostomy output. WOC NP ordered CMP which showed an AKI with a Scr of 1.88 and she was called to go to the ED.  She was in her usual state of health about 5 days ago and started feeling nauseous and like she needed to vomit.  As a result she has not eaten since Thursday.  Her right lower quadrant is also hurting her but it usually hurts her, this is where her ostomy bag is.  It sounds like she has had this bag for about a year due to bowel removal from C. difficile infection in the setting of Crohn's disease.  She has had increased production in her ostomy bag over the last few days, the pain has also increased around her bag.  She was trying to make it to her appointment at [facility] next Friday but was unable to due to ostomy bag pain and increased output so she went yesterday.  She says she usually does not have anything come out of her rectum but has had incontinence of stools from her rectum the last couple of days as well.  At some point a provider gave her Imodium  but she has not used it because she does not like what it does to her ostomy bag, she feels like the stool gets stuck in the hole.  About 2 days ago she noticed that she had burning on urination.  She has not been urinating frequently but she thinks this is due to her low oral intake.  Over the last 1 or 2 days she feels the need to pee but is unable to get  anything out.  She tries to push the urine out but feels pain along the bilateral groin and into her vagina.  She says the pain has progressed from her right side to her back, where her kidneys are she says. Denies discharge.   Over the last few days she has noticed that she becomes short of breath and she feels like her heart races when she stands up and she had this happen while she was in the ED and she had to take a pause when walking to the bathroom so she could catch her breath.  She has not taken her metoprolol  because of her upset stomach and she thinks this may be causing her racing heart.  She describes the chest pain as pressure in the center of her chest that she can feel in her right arm.  She has had this pain before and has nitroglycerin  at home that she takes as needed.  She says the medicine helps some but it mainly just gives her headaches.  During these episodes she does feel  dizzy and lightheaded.  She did have a headache yesterday.  Her legs have become crampy but denies swelling.  The only vision abnormality she can recall is blurry vision that she has glasses for.  She denies swelling in her legs.  Denies sick contacts, URI symptoms or eating food that could have been spoiled.  Review of Systems: A complete ROS was negative except as per HPI.   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   ALPRAZolam  (XANAX ) 1 MG tablet Take 0.5 tablets (0.5 mg total) by mouth 2 (two) times daily as needed for anxiety. (Patient taking differently: Take 1 mg by mouth 3 (three) times daily.)   amitriptyline  (ELAVIL ) 100 MG tablet Take 100 mg by mouth at bedtime.   cefdinir  (OMNICEF ) 300 MG capsule Take 1 capsule (300 mg total) by mouth 2 (two) times daily.   cetirizine (ZYRTEC) 10 MG tablet Take 10 mg by mouth daily.   cyanocobalamin  (VITAMIN B12) 1000 MCG/ML injection Inject 1 mL (1,000 mcg total) into the skin daily for 4 days, THEN 1 mL (1,000 mcg total) every 30 (thirty) days.   cyclobenzaprine  (FLEXERIL ) 10 MG tablet Take 10 mg by mouth 3 (three) times daily.   feeding supplement (ENSURE ENLIVE / ENSURE PLUS) LIQD Take 237 mLs by mouth 2 (two) times daily between meals.   gabapentin  (NEURONTIN ) 300 MG capsule Take 300 mg by mouth daily as needed (for pain).   levETIRAcetam  (KEPPRA ) 750 MG tablet Take 750 mg by mouth 2 (two) times daily.   loperamide  (IMODIUM  A-D) 2 MG tablet Take 2 tablets (4 mg total) by mouth in the morning, at noon, and at bedtime.   metoprolol  tartrate (LOPRESSOR ) 25 MG tablet Take 25 mg by mouth.   nitroGLYCERIN  (NITROSTAT ) 0.4 MG SL tablet Place 1 tablet (0.4 mg total) under the tongue every 5 (five) minutes as needed for chest pain.   ondansetron  (ZOFRAN -ODT) 4 MG disintegrating tablet Take 1 tablet (4 mg total) by mouth every 8 (eight) hours as needed for nausea or vomiting.   pantoprazole  (PROTONIX ) 40 MG tablet Take 1 tablet (40 mg total) by mouth 2 (two) times daily.   scopolamine  (TRANSDERM-SCOP) 1 MG/3DAYS Place 1 patch onto the skin every 3 (three) days. As needed   SPIRIVA  RESPIMAT 2.5 MCG/ACT AERS Inhale 2 puffs into the lungs daily.   traZODone  (DESYREL ) 50 MG tablet Take 50 mg by mouth at bedtime.   triazolam  (HALCION ) 0.25 MG tablet Take 0.25 mg by mouth at  bedtime.   VENTOLIN  HFA 108 (90 Base) MCG/ACT inhaler Inhale 1 puff into the lungs every 6 (six) hours as needed for wheezing or shortness of breath.   NOT midodrine  (doesn't mix together)   Allergies: Allergies as of 02/24/2024 - Review Complete 02/24/2024  Allergen Reaction Noted   Codeine Shortness Of Breath, Swelling, and Rash  Hydrocodone  Shortness Of Breath, Swelling, and Rash 12/26/2012   Ketorolac  Nausea And Vomiting, Nausea Only, and Swelling 06/18/2022   Morphine and codeine Shortness Of Breath, Swelling, and Rash 12/26/2012   Penicillins Shortness Of Breath, Swelling, and Other (See Comments)    Ativan  [lorazepam ] Other (See Comments) 12/20/2016   Oxycodone -acetaminophen  Nausea And Vomiting 03/22/2023   Pregabalin  Other (See Comments) 06/18/2021   Strawberry extract Swelling 07/22/2011   Watermelon concentrate Swelling 07/22/2011   Albuterol  Palpitations 07/08/2021   Atarax  [hydroxyzine ] Itching and Swelling 02/23/2023   Benadryl  [diphenhydramine  hcl] Palpitations 09/23/2011   Humira  [adalimumab ] Rash 12/24/2021   Latex Rash    Nickel Rash 11/19/2021   Remicade  [infliximab ] Rash 07/08/2021   Tramadol  Rash 01/12/2022    Past Surgical History:  Procedure Laterality Date   ABDOMINAL HYSTERECTOMY     with right salpingo oophorectomy 2005   APPENDECTOMY  2006   BALLOON DILATION N/A 02/19/2021   Procedure: BALLOON DILATION;  Surgeon: Cindie Carlin POUR, DO;  [facility];  Service: Endoscopy;  Laterality: N/A;  Sigmoid colon stricture   BIOPSY  02/06/2021   Procedure: BIOPSY;  Surgeon: Cindie Carlin POUR, DO;  [facility]: [facility];  Service: Endoscopy;;   BIOPSY  02/19/2021   Procedure: BIOPSY;  Surgeon: Cindie Carlin POUR, DO;  [facility];  Service: Endoscopy;;   BIOPSY  07/15/2021   Procedure: BIOPSY;  Surgeon: Golda Claudis PENNER, MD;  [facility];  Service: Endoscopy;;   BIOPSY  04/08/2023   Procedure: BIOPSY;  Surgeon: Shaaron Lamar HERO, MD;  [facility];  Service: Endoscopy;;   BIOPSY  06/25/2023   Procedure: BIOPSY;  Surgeon: Federico Rosario BROCKS, MD;  Location: [facility] ENDOSCOPY;  Service: Gastroenterology;;   CESAREAN SECTION     1996   CHOLECYSTECTOMY     COLECTOMY N/A 02/11/2023   Procedure: TOTAL COLECTOMY;  Surgeon: Vanderbilt Ned, MD;  Location: [facility] OR;  Service: General;  Laterality: N/A;   COLONOSCOPY  2010   Dr. Shaaron; negative except for hemorrhoids   COLOSTOMY  02/11/2023   Procedure: COLOSTOMY;  Surgeon: Vanderbilt Ned, MD;  Location: [facility] OR;  Service: General;;   ESOPHAGEAL DILATION N/A 10/25/2014   Procedure: ESOPHAGEAL DILATION;  Surgeon: Claudis PENNER Golda, MD;  [facility];  Service: Endoscopy;  Laterality: N/A;   ESOPHAGOGASTRODUODENOSCOPY N/A 10/25/2014   Procedure: ESOPHAGOGASTRODUODENOSCOPY (EGD);  Surgeon: Claudis PENNER Golda, MD;  [facility];  Service: Endoscopy;  Laterality: N/A;  1250   ESOPHAGOGASTRODUODENOSCOPY (EGD) WITH PROPOFOL  N/A 06/25/2023   Procedure: ESOPHAGOGASTRODUODENOSCOPY (EGD) WITH PROPOFOL ;  Surgeon: Federico Rosario BROCKS, MD;  Location: [facility] ENDOSCOPY;  Service: Gastroenterology;  Laterality: N/A;   FLEXIBLE SIGMOIDOSCOPY N/A 02/06/2021   Procedure: FLEXIBLE SIGMOIDOSCOPY;  Surgeon: Cindie Carlin POUR, DO;  [facility];  Service: Endoscopy;  Laterality: N/A;   FLEXIBLE SIGMOIDOSCOPY N/A 02/19/2021   Procedure: FLEXIBLE SIGMOIDOSCOPY;  Surgeon: Cindie Carlin POUR, DO;  [facility];  Service: Endoscopy;  Laterality: N/A;   FLEXIBLE SIGMOIDOSCOPY N/A 02/02/2023   Procedure: FLEXIBLE SIGMOIDOSCOPY;  Surgeon: Albertus Gordy HERO, MD;  Location: [facility] ENDOSCOPY;  Service: Gastroenterology;  Laterality: N/A;   FLEXIBLE SIGMOIDOSCOPY N/A 02/10/2023   Procedure: FLEXIBLE SIGMOIDOSCOPY;  Surgeon: Legrand Victory LITTIE DOUGLAS, MD;  Location: [facility] ENDOSCOPY;  Service: Gastroenterology;  Laterality: N/A;   FLEXIBLE SIGMOIDOSCOPY N/A 04/08/2023   Procedure: FLEXIBLE SIGMOIDOSCOPY;   Surgeon: Shaaron Lamar HERO, MD;  [facility];  Service: Endoscopy;  Laterality: N/A;  IR FLUORO GUIDE CV LINE LEFT  02/21/2023   IR US  GUIDE VASC ACCESS LEFT  02/21/2023   LAPAROTOMY N/A 02/11/2023   Procedure: EXPLORATORY LAPAROTOMY;  Surgeon: Vanderbilt Ned, MD;  Location: [facility];  Service: General;  Laterality: N/A;   OOPHORECTOMY     left for torsion and ovarian fibroma; uterine myoma resected 1995   SAVORY DILATION N/A 06/25/2023   Procedure: SAVORY DILATION;  Surgeon: Federico Rosario BROCKS, MD;  Location: [facility] ENDOSCOPY;  Service: Gastroenterology;  Laterality: N/A;   SIGMOIDOSCOPY  07/15/2021   Procedure: SIGMOIDOSCOPY;  Surgeon: Golda Claudis PENNER, MD;  [facility];  Service: Endoscopy;;   TRANSESOPHAGEAL ECHOCARDIOGRAM (CATH LAB) N/A 02/18/2023   Procedure: TRANSESOPHAGEAL ECHOCARDIOGRAM;  Surgeon: Barbaraann Darryle Ned, MD;  Location: [facility] INVASIVE CV LAB;  Service: Cardiovascular;  Laterality: N/A;    Social:  Lives With: Self in [place], [place] Support: Daughter  Level of Function: independent, 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    OBJECTIVE:   Physical Exam: Blood pressure (!) 95/51, pulse 88, temperature 97.6 F (36.4 C), resp. rate 18, height 5' 5 (1.651 m), weight 58.5 kg, SpO2 100%.  Constitutional: tired-appearing female sitting in bed, in no acute distress, easily distracted HENT: normocephalic atraumatic, dry membranes moist Cardiovascular: regular rate and rhythm, no m/r/g, neg JVD Pulmonary/Chest: normal work of breathing on room air, lungs clear to auscultation bilaterally. Neg crackles  Abdominal: soft, right side TTP. Neg fluid wave neg asterixis Ostomy bag full of dark brown liquid with warmth Neurological: alert & oriented x 3 MSK: no gross abnormalities. Neg pitting edema, no CVA  tenderness Skin: warm, dec turgor, denuded skin at 12 oclock position on ostomy as well as along pants lining Psych: Normal mood and affect  Labs:    Latest Ref Rng & Units 02/24/2024    7:54 PM 12/31/2023    3:51 AM 12/30/2023    9:41 AM  CBC  WBC 4.0 - 10.5 K/uL 10.9  8.3    Hemoglobin 12.0 - 15.0 g/dL 87.9  9.0  9.0   Hematocrit 36.0 - 46.0 % 37.5  28.6  28.7   Platelets 150 - 400 K/uL 428  408          Latest Ref Rng & Units 02/24/2024    7:54 PM 02/24/2024   11:10 AM 12/31/2023    3:51 AM  CMP  Glucose 70 - 99 mg/dL 898  97  98   BUN 6 - 20 mg/dL 14  15  9    Creatinine 0.44 - 1.00 mg/dL 8.25  8.11  8.69   Sodium 135 - 145 mmol/L 133  133  141   Potassium 3.5 - 5.1 mmol/L 3.8  3.8  3.9   Chloride 98 - 111 mmol/L 98  98  106   CO2 22 - 32 mmol/L 21  22  27    Calcium  8.9 - 10.3 mg/dL 89.9  89.7  9.1   Total Protein 6.5 - 8.1 g/dL 7.4   5.6   Total Bilirubin 0.0 - 1.2 mg/dL 0.8   0.7   Alkaline Phos 38 - 126 U/L 126   91   AST 15 - 41 U/L 24   21   ALT 0 - 44 U/L 27   22       Imaging: CT ABDOMEN PELVIS WO CONTRAST Result  Date: 02/25/2024 EXAM: CT ABDOMEN AND PELVIS WITHOUT CONTRAST 02/25/2024 06:13:18 AM TECHNIQUE: CT of the abdomen and pelvis was performed without the administration of intravenous contrast. Multiplanar reformatted images are provided for review. Automated exposure control, iterative reconstruction, and/or weight-based adjustment of the mA/kV was utilized to reduce the radiation dose to as low as reasonably achievable. COMPARISON: 10/17/2023 CLINICAL HISTORY: Bowel obstruction suspected. FINDINGS: LOWER CHEST: Scar-like density within the anterior right lower lobe has significantly decreased from 02/25/2023 compatible with resolving inflammation or infection. LIVER: The liver is unremarkable. GALLBLADDER AND BILE DUCTS: Status post cholecystectomy. No biliary ductal dilatation. SPLEEN: The spleen is within normal limits in size and appearance. PANCREAS: The  pancreas is normal in size and contour without focal lesion or ductal dilatation. ADRENAL GLANDS: Normal size and morphology bilaterally. No nodule, thickening, or hemorrhage. No periadrenal stranding. KIDNEYS, URETERS AND BLADDER: No stones in the kidneys or ureters. No hydronephrosis. No perinephric or periureteral stranding. Urinary bladder is unremarkable. GI AND BOWEL: Stomach demonstrates no acute abnormality. Status post subtotal colectomy with right lower quadrant ileostomy and Hartmann's pouch creation in the pelvis. No pathologic dilatation of the bowel loops. Equivocal wall thickening of the pelvic small bowel loops suboptimally evaluated due to lack of IV and enteric contrast material. Unchanged wall thickening along the left peritoneal reflection. Soft tissue stranding about the Hartmann's pouch with mild increased presacral soft tissue is stable from previous exam. These findings are favored to represent post-treatment change. Surgical clips noted in the left hemipelvis. PERITONEUM AND RETROPERITONEUM: No free fluid, fluid collections, or signs of pneumoperitoneum. VASCULATURE: Aorta is normal in caliber. LYMPH NODES: No lymphadenopathy. REPRODUCTIVE ORGANS: Status post hysterectomy. No adnexal mass. BONES AND SOFT TISSUES: No acute osseous abnormality. No focal soft tissue abnormality. IMPRESSION: 1. No evidence of bowel obstruction. 2. Equivocal wall thickening of the pelvic small bowel loops, limited by lack of IV and enteric contrast. Findings may reflect mild enteritis in this patient who has a history of known crohn disease. 3. Status post subtotal colectomy with right lower quadrant ileostomy and hartmann's pouch 4. Stable post-treatment changes along the left peritoneal reflection and around the Hartmanns pouch with mild presacral soft tissue thickening, favored postoperative change. Electronically signed by: Waddell Calk MD 02/25/2024 06:31 AM EDT RP Workstation: [id number]      EKG:  personally reviewed my interpretation is NSR. Prior EKG c/w pulmonary disease  ASSESSMENT & PLAN:   Assessment & Plan by Problem: Principal Problem:   Dehydration Active Problems:   AKI (acute kidney injury)   Cacey Zurri Rudden is a 53 y.o. person living with a history of COPD, seizure d/o, Crohns disease s/p colectomy 2024, recurrent C diff infection and toxic megacolon, HTN, T2DM, chronic pain, GERD, and PSVT who presented with N/V and an elevated Cr and admitted for anorexia and cystitis on hospital day 0  #RMQ ileostomy high output #Nausea and vomiting #AKI Since arrival to the ED, her nausea and vomiting has not improved and she has required 2L IVF for transient hypotension which has somewhat responded to fluids. Suspect high RMQ ileostomy output is due to gastroenteritis vs med noncompliance with imodium . Cr 1.3 on 12/31/23, Cr 1.88 10/24. Suspect patient's AKI is predominantly prerenal due to dehydration and anorexia. She is dry on exam, mucus membranes and skin turgor. Goal to have her tolerate PO intake.  -check orthostatics  - BMP Mg AM -c diff negative  -Will monitor patient's BP after she received 2L of IVF -Will try ondansetron  4  mg and Tigan 200 mg injection and scopolamine  patches -Psyllium for large stool output -WOC consulted for ostomy management   Complicated Acute Cystitis  Feelings of urge without successful void. Additionally has pain in BL groin that radiates to her vagina. History complicated by chronic RLQ pain 2/2 ostomy, states she's starting to have pain in her BL back. CTAP today w/o signs of pyelonephritis. UA 10/24 with leukocytes and many bacteria.  -pending UA cx 10/24 -CTX 1 g for 3 days - acetaminophen  500 mg  -CBC AM  Leukocytosis Afebrile. Not ill appearing on exam, less concern for bacteremia. WBC 10.9 yesterday which may be due to UTI vs gastroenteritis vs chronic inflammation from IBD. Will monitor while on CTX.  -CBC AM  Crohns  Disease Recurrent C. Diff Follows with [facility]. Previously on skyrizi, d/c after colectomy (toxic megacolon s/p colectomy with ileostomy 02/11/2023).   Seizure Disorder Can't remember the last time she's had a seizure. Compliant with medications. Exam not concerning for seizures. Denies recent falls.  -Continue home Keppra  750 mg BID  PSVT HTN EKG 10/25 in NSR. Not having chest pain during encounter. Holding home metoprolol  25 mg while BP low. Will hold home nitroglycerin  SL unless requested. In this case, eval and consider troponins.   Hypercoagulable  History of R internal jugular and brachial vein thrombus and PE in 2024. Not currently on anticoagulation, looks like it was last prescribed in 06/2023. Will place on prophylaxis rivaroxaban 10 mg. She was tachycardic at times but no signs of DVT, saturating high on room air. Suspect tachy due to hypovolemia. If she becomes dyspneic at rest with desaturations, consider CTA PE.   COPD Continue home incruse ellipta   GERD EDG with dilation in 06/2023. Continue home pantoprazole  40 mg BID  Chronic pain syndrome Continue home gabapentin  300 mg, cyclobenzaprine  10 mg TID  Anxiety Continue home alprazolam  0.5 mg BID and triazolam  0.25 mg at bedtime   Diet: Carb-Modified VTE: NOAC Code: Full  Prior to Admission Living Arrangement: Home, living alone Anticipated Discharge Location: Home Barriers to Discharge: clinical improvement  Dispo: Admit patient to Observation with expected length of stay less than 2 midnights.  Signed:   Damien Lease, DO Internal Medicine Resident PGY-2 02/25/2024, 2:49 PM   Please contact the on call pager at [phone]

## 2024-02-25 NOTE — Consult Note (Addendum)
 WOC Nurse ostomy consult note Stoma type/location: RMQ colostomy Stomal assessment/size: 32 mm, red and viable. Peristomal assessment: No able to assess. Pt is follow by outpatient ostomy clinic. Ostomy with high output. Treatment options for stomal/peristomal skin: Powder #6, barrier ring [id number], belt #[id number]. Ostomy pouching: 2pc. Convex [id number] and high output bag [id number]. Attach the pouch to a foley bag if necessary.  Enrolled patient in [employer] DC program: Yes.  WOC team will follow MON.  Please reconsult if further assistance is needed. Thank-you,  Lela Holm RN, CNS, [employer], MSN.  (Phone [phone])

## 2024-02-25 NOTE — ED Provider Notes (Signed)
 [place] EMERGENCY DEPARTMENT AT [facility] Provider Note   CSN: [id number] Arrival date & time: 02/24/24  1815     Patient presents with: Dehydration, Emesis, and Nausea   [name] is a 53 y.o. female.   The history is provided by the patient.  Emesis  She has a history of hypertension, diabetes, Crohn's disease, COPD, seizures and was sent here by the stoma therapy nurse because of concern of dehydration.  She states that she has been vomiting for the last 4 days and has not been able to hold anything down.  In spite of that, she has been having normal output from her colostomy.  She denies fever chills or sweats.  He has noted decreased urine output but denies dysuria or urgency.  She is complaining of generalized abdominal pain.    Prior to Admission medications   Medication Sig Start Date End Date Taking? Authorizing Provider  albuterol  (ACCUNEB ) 1.25 MG/3ML nebulizer solution Take 1 ampule by nebulization every 6 (six) hours as needed for wheezing or shortness of breath. 04/21/23   [provider]  ALPRAZolam  (XANAX ) 1 MG tablet Take 0.5 tablets (0.5 mg total) by mouth 2 (two) times daily as needed for anxiety. Patient taking differently: Take 1 mg by mouth 3 (three) times daily. 06/28/23   Elgergawy, Brayton RAMAN, MD  amitriptyline  (ELAVIL ) 100 MG tablet Take 100 mg by mouth at bedtime.    [provider]  cetirizine (ZYRTEC) 10 MG tablet Take 10 mg by mouth daily. 01/08/22   [provider]  cyanocobalamin  (VITAMIN B12) 1000 MCG/ML injection Inject 1 mL (1,000 mcg total) into the skin daily for 4 days, THEN 1 mL (1,000 mcg total) every 30 (thirty) days. 01/01/24 07/03/24  Maree, Pratik D, DO  cyclobenzaprine  (FLEXERIL ) 10 MG tablet Take 10 mg by mouth 3 (three) times daily.    [provider]  feeding supplement (ENSURE ENLIVE / ENSURE PLUS) LIQD Take 237 mLs by mouth 2 (two) times daily between meals. 01/04/22   Ricky Fines, MD   gabapentin  (NEURONTIN ) 300 MG capsule Take 300 mg by mouth daily as needed (for pain). 10/15/22   [provider]  levETIRAcetam  (KEPPRA ) 750 MG tablet Take 750 mg by mouth 2 (two) times daily. 09/30/20   [provider]  loperamide  (IMODIUM  A-D) 2 MG tablet Take 2 tablets (4 mg total) by mouth in the morning, at noon, and at bedtime. 12/31/23   Maree, Pratik D, DO  midodrine  (PROAMATINE ) 10 MG tablet Take 1 tablet (10 mg total) by mouth every 8 (eight) hours. 12/31/23   Maree, Pratik D, DO  nitroGLYCERIN  (NITROSTAT ) 0.4 MG SL tablet Place 1 tablet (0.4 mg total) under the tongue every 5 (five) minutes as needed for chest pain. 04/25/23   Johnson, Clanford L, MD  pantoprazole  (PROTONIX ) 40 MG tablet Take 1 tablet (40 mg total) by mouth 2 (two) times daily. 09/12/23   Carlan, Chelsea L, NP  scopolamine  (TRANSDERM-SCOP) 1 MG/3DAYS Place 1 patch onto the skin every 3 (three) days. As needed    [provider]  SPIRIVA  RESPIMAT 2.5 MCG/ACT AERS Inhale 2 puffs into the lungs daily.    [provider]  triazolam  (HALCION ) 0.25 MG tablet Take 0.25 mg by mouth at bedtime. 10/08/23   [provider]  VENTOLIN  HFA 108 (90 Base) MCG/ACT inhaler Inhale 1 puff into the lungs every 6 (six) hours as needed for wheezing or shortness of breath.    [provider]    Allergies: Codeine, Hydrocodone , Ketorolac , Morphine and codeine, Penicillins, Ativan  [lorazepam ], Oxycodone -acetaminophen , Pregabalin , Strawberry extract, Watermelon concentrate, Albuterol , Atarax  [hydroxyzine ], Benadryl  [diphenhydramine  hcl], Humira  [adalimumab ], Latex, Nickel, Remicade  [infliximab ], and Tramadol     Review of Systems  Gastrointestinal:  Positive for vomiting.  All other systems reviewed and are negative.   Updated Vital Signs BP 118/75 (BP Location: Left Arm)   Pulse (!) 123   Temp 98.2 F (36.8 C)   Resp 18   Ht 5' 5 (1.651 m)   Wt 58.5 kg   SpO2 100%   BMI 21.47 kg/m    Physical Exam Vitals and nursing note reviewed.   53 year old female, resting comfortably and in no acute distress. Vital signs are significant for rapid heart rate. Oxygen  saturation is 100%, which is normal. Head is normocephalic and atraumatic. PERRLA, EOMI. Oropharynx is clear. Neck is nontender and supple without adenopathy. Back is nontender and there is no CVA tenderness. Lungs are clear without rales, wheezes, or rhonchi. Chest is nontender. Heart has regular rate and rhythm without murmur. Abdomen is soft, flat, with colostomy present in the right lower quadrant.  There is generalized tenderness without rebound or guarding.  There is some erythema in the area surrounding the colostomy.. Extremities have no cyanosis or edema, full range of motion is present. Skin is warm and dry without rash. Neurologic: Mental status is normal, cranial nerves are intact, moves all extremities equally.  (all labs ordered are listed, but only abnormal results are displayed) Labs Reviewed  COMPREHENSIVE METABOLIC PANEL WITH GFR - Abnormal; Notable for the following components:      Result Value   Sodium 133 (*)    CO2 21 (*)    Glucose, Bld 101 (*)    Creatinine, Ser 1.74 (*)    GFR, Estimated 34 (*)    All other components within normal limits  CBC - Abnormal; Notable for the following components:   WBC 10.9 (*)    Platelets 428 (*)    All other components within normal limits  URINALYSIS, ROUTINE W REFLEX MICROSCOPIC - Abnormal; Notable for the following components:   APPearance CLOUDY (*)    Leukocytes,Ua LARGE (*)    Bacteria, UA MANY (*)    Non Squamous Epithelial 0-5 (*)    All other components within normal limits  LIPASE, BLOOD    EKG: None  Radiology: No results found.   Procedures   Medications Ordered in the ED - No data to display                                  Medical Decision Making Amount and/or Complexity of Data Reviewed Labs: ordered. Radiology:  ordered.  Risk Prescription drug management.   Nausea and vomiting in patient with colostomy.  This a presentation with a wide range of treatment options and carries with it a high risk of morbidity and complications.  Differential diagnosis includes, but is not limited to, viral gastroenteritis, food poisoning, bowel obstruction.  I have reviewed her past records, and note hospitalization on 12/29/2023 and 10/17/2023 for acute kidney injury.  I have reviewed her laboratory tests, and my interpretation is mild hyponatremia which is not felt to be clinically significant, elevated creatinine with normal BUN, mild leukocytosis and thrombocytosis, normal hemoglobin which is up 3 g compared with 12/31/2023 and is suggestive of hemoconcentration.  Creatinine elevation is sufficient to diagnose acute kidney injury.  Creatinine was 1.30 on 8/30 and 1.74 today.  I have ordered IV fluids and I have ordered CT of abdomen and pelvis.  Urinalysis does show significant pyuria and bacteriuria and I have ordered a dose of ceftriaxone  to treat her apparent UTI.  CT scan shows no evidence of small bowel obstruction, equivocal wall thickening of the pelvic small bowel loops.  I have independently viewed the images, and agree with the radiologist's interpretation.  She feels much better following above-noted treatment.  I have ordered a fluid challenge.  If she is able to tolerate oral fluids, I feel she can safely go home.  If she cannot tolerate oral fluids she will need to be admitted.  Case is signed out to Dr. Francesca.     Final diagnoses:  Nausea and vomiting, unspecified vomiting type  Acute kidney injury (nontraumatic)  Urinary tract infection without hematuria, site unspecified    ED Discharge Orders     None          Raford Lenis, MD 02/25/24 [phone]

## 2024-02-25 NOTE — Plan of Care (Signed)
  Problem: Education: Goal: Knowledge of General Education information will improve Description: Including pain rating scale, medication(s)/side effects and non-pharmacologic comfort measures Outcome: Progressing   Problem: Health Behavior/Discharge Planning: Goal: Ability to manage health-related needs will improve Outcome: Progressing   Problem: Clinical Measurements: Goal: Ability to maintain clinical measurements within normal limits will improve Outcome: Progressing Goal: Will remain free from infection Outcome: Progressing Goal: Diagnostic test results will improve Outcome: Progressing Goal: Respiratory complications will improve Outcome: Progressing Goal: Cardiovascular complication will be avoided Outcome: Progressing   Problem: Activity: Goal: Risk for activity intolerance will decrease Outcome: Progressing   Problem: Elimination: Goal: Will not experience complications related to bowel motility Outcome: Progressing   Problem: Pain Managment: Goal: General experience of comfort will improve and/or be controlled Outcome: Progressing   Problem: Safety: Goal: Ability to remain free from injury will improve Outcome: Progressing

## 2024-02-26 DIAGNOSIS — Z933 Colostomy status: Secondary | ICD-10-CM | POA: Diagnosis not present

## 2024-02-26 DIAGNOSIS — E871 Hypo-osmolality and hyponatremia: Secondary | ICD-10-CM | POA: Diagnosis present

## 2024-02-26 DIAGNOSIS — F17210 Nicotine dependence, cigarettes, uncomplicated: Secondary | ICD-10-CM | POA: Diagnosis present

## 2024-02-26 DIAGNOSIS — K50918 Crohn's disease, unspecified, with other complication: Secondary | ICD-10-CM

## 2024-02-26 DIAGNOSIS — Z932 Ileostomy status: Secondary | ICD-10-CM

## 2024-02-26 DIAGNOSIS — T447X6A Underdosing of beta-adrenoreceptor antagonists, initial encounter: Secondary | ICD-10-CM | POA: Diagnosis present

## 2024-02-26 DIAGNOSIS — I471 Supraventricular tachycardia, unspecified: Secondary | ICD-10-CM | POA: Diagnosis present

## 2024-02-26 DIAGNOSIS — E860 Dehydration: Secondary | ICD-10-CM | POA: Diagnosis present

## 2024-02-26 DIAGNOSIS — Z79899 Other long term (current) drug therapy: Secondary | ICD-10-CM | POA: Diagnosis not present

## 2024-02-26 DIAGNOSIS — Z86718 Personal history of other venous thrombosis and embolism: Secondary | ICD-10-CM | POA: Diagnosis not present

## 2024-02-26 DIAGNOSIS — G894 Chronic pain syndrome: Secondary | ICD-10-CM | POA: Diagnosis present

## 2024-02-26 DIAGNOSIS — E872 Acidosis, unspecified: Secondary | ICD-10-CM | POA: Diagnosis present

## 2024-02-26 DIAGNOSIS — Z8619 Personal history of other infectious and parasitic diseases: Secondary | ICD-10-CM

## 2024-02-26 DIAGNOSIS — Z86711 Personal history of pulmonary embolism: Secondary | ICD-10-CM | POA: Diagnosis not present

## 2024-02-26 DIAGNOSIS — K626 Ulcer of anus and rectum: Secondary | ICD-10-CM | POA: Diagnosis present

## 2024-02-26 DIAGNOSIS — I10 Essential (primary) hypertension: Secondary | ICD-10-CM | POA: Diagnosis present

## 2024-02-26 DIAGNOSIS — E119 Type 2 diabetes mellitus without complications: Secondary | ICD-10-CM | POA: Diagnosis present

## 2024-02-26 DIAGNOSIS — G40909 Epilepsy, unspecified, not intractable, without status epilepticus: Secondary | ICD-10-CM | POA: Diagnosis present

## 2024-02-26 DIAGNOSIS — J4489 Other specified chronic obstructive pulmonary disease: Secondary | ICD-10-CM | POA: Diagnosis present

## 2024-02-26 DIAGNOSIS — D649 Anemia, unspecified: Secondary | ICD-10-CM | POA: Diagnosis present

## 2024-02-26 DIAGNOSIS — K5090 Crohn's disease, unspecified, without complications: Secondary | ICD-10-CM | POA: Diagnosis present

## 2024-02-26 DIAGNOSIS — L309 Dermatitis, unspecified: Secondary | ICD-10-CM | POA: Diagnosis present

## 2024-02-26 DIAGNOSIS — F419 Anxiety disorder, unspecified: Secondary | ICD-10-CM | POA: Diagnosis present

## 2024-02-26 DIAGNOSIS — N179 Acute kidney failure, unspecified: Secondary | ICD-10-CM | POA: Diagnosis present

## 2024-02-26 DIAGNOSIS — N3000 Acute cystitis without hematuria: Secondary | ICD-10-CM | POA: Diagnosis present

## 2024-02-26 DIAGNOSIS — A0471 Enterocolitis due to Clostridium difficile, recurrent: Secondary | ICD-10-CM | POA: Diagnosis present

## 2024-02-26 LAB — BASIC METABOLIC PANEL WITH GFR
Anion gap: 10 (ref 5–15)
Anion gap: 10 (ref 5–15)
BUN: 11 mg/dL (ref 6–20)
BUN: 8 mg/dL (ref 6–20)
CO2: 21 mmol/L — ABNORMAL LOW (ref 22–32)
CO2: 19 mmol/L — ABNORMAL LOW (ref 22–32)
Calcium: 8.6 mg/dL — ABNORMAL LOW (ref 8.9–10.3)
Calcium: 8.7 mg/dL — ABNORMAL LOW (ref 8.9–10.3)
Chloride: 109 mmol/L (ref 98–111)
Chloride: 106 mmol/L (ref 98–111)
Creatinine, Ser: 1.59 mg/dL — ABNORMAL HIGH (ref 0.44–1.00)
Creatinine, Ser: 1.62 mg/dL — ABNORMAL HIGH (ref 0.44–1.00)
GFR, Estimated: 38 mL/min — ABNORMAL LOW (ref >60)
GFR, Estimated: 38 mL/min — ABNORMAL LOW (ref >60)
Glucose, Bld: 96 mg/dL (ref 70–99)
Glucose, Bld: 111 mg/dL — ABNORMAL HIGH (ref 70–99)
Potassium: 3.7 mmol/L (ref 3.5–5.1)
Potassium: 3.5 mmol/L (ref 3.5–5.1)
Sodium: 140 mmol/L (ref 135–145)
Sodium: 135 mmol/L (ref 135–145)

## 2024-02-26 LAB — C-REACTIVE PROTEIN: CRP: 0.7 mg/dL (ref <1.0)

## 2024-02-26 LAB — URINALYSIS, ROUTINE W REFLEX MICROSCOPIC
Bilirubin Urine: NEGATIVE
Glucose, UA: NEGATIVE mg/dL
Hgb urine dipstick: NEGATIVE
Ketones, ur: NEGATIVE mg/dL
Nitrite: NEGATIVE
Protein, ur: NEGATIVE mg/dL
Specific Gravity, Urine: 1.011 (ref 1.005–1.030)
pH: 5 (ref 5.0–8.0)

## 2024-02-26 LAB — CBC
HCT: 29.2 % — ABNORMAL LOW (ref 36.0–46.0)
Hemoglobin: 9.4 g/dL — ABNORMAL LOW (ref 12.0–15.0)
MCH: 26.5 pg (ref 26.0–34.0)
MCHC: 32.2 g/dL (ref 30.0–36.0)
MCV: 82.3 fL (ref 80.0–100.0)
Platelets: 384 K/uL (ref 150–400)
RBC: 3.55 MIL/uL — ABNORMAL LOW (ref 3.87–5.11)
RDW: 15 % (ref 11.5–15.5)
WBC: 8.8 K/uL (ref 4.0–10.5)
nRBC: 0 % (ref 0.0–0.2)

## 2024-02-26 LAB — URINE CULTURE

## 2024-02-26 LAB — SEDIMENTATION RATE: Sed Rate: 23 mm/h — ABNORMAL HIGH (ref 0–22)

## 2024-02-26 LAB — MAGNESIUM
Magnesium: 1.2 mg/dL — ABNORMAL LOW (ref 1.7–2.4)
Magnesium: 1.6 mg/dL — ABNORMAL LOW (ref 1.7–2.4)

## 2024-02-26 LAB — HIV ANTIBODY (ROUTINE TESTING W REFLEX): HIV Screen 4th Generation wRfx: NONREACTIVE

## 2024-02-26 MED ORDER — MAGNESIUM SULFATE 2 GM/50ML IV SOLN
2.0000 g | Freq: Once | INTRAVENOUS | Status: AC
  Administered 2024-02-26: 2 g via INTRAVENOUS
  Filled 2024-02-26: qty 50

## 2024-02-26 MED ORDER — PSYLLIUM 95 % PO PACK
1.0000 | PACK | Freq: Three times a day (TID) | ORAL | Status: DC
  Administered 2024-02-26 – 2024-02-27 (×2): 1 via ORAL
  Filled 2024-02-26 (×3): qty 1

## 2024-02-26 MED ORDER — MIDODRINE HCL 5 MG PO TABS
10.0000 mg | ORAL_TABLET | Freq: Three times a day (TID) | ORAL | Status: DC
Start: 2024-02-26 — End: 2024-02-26

## 2024-02-26 MED ORDER — METOPROLOL SUCCINATE ER 25 MG PO TB24: 12.5000 mg | ORAL_TABLET | Freq: Every day | ORAL | Status: DC

## 2024-02-26 MED ORDER — MAGNESIUM SULFATE 2 GM/50ML IV SOLN: 2.0000 g | Freq: Once | INTRAVENOUS | Status: DC

## 2024-02-26 NOTE — Plan of Care (Signed)

## 2024-02-26 NOTE — Evaluation (Signed)
 Physical Therapy Evaluation Patient Details Name: [name] MRN: [id number] DOB: 01/07/1971 Today's Date: 02/26/2024  History of Present Illness  Pt referred to [facility] ED due to concerns for dehydration. Pt has been vomiting for 4 days and has not been able to hold anything down. Pt is admitted for AKI. PMH includes chron's with colostomy, HTN, HLD, DMII, COPD, seizure disorder, fungal endocarditis, anxiety.  Clinical Impression  Pt is presenting at ind for bed mobility, sit to stand, gait and stairs per home set up without an AD. Pt has some help from family/friends if needed. Currently pt is presenting at baseline level of functioning and no skilled physical therapy services recommended. Pt will be discharged from skilled physical therapy services at this time; please re-consult if further needs arise.          If plan is discharge home, recommend the following: Other (comment) (as needed)     Equipment Recommendations None recommended by PT        Precautions / Restrictions Precautions Precautions: None Recall of Precautions/Restrictions: Intact Restrictions Weight Bearing Restrictions Per Provider Order: No      Mobility  Bed Mobility Overal bed mobility: Independent    Transfers Overall transfer level: Independent Equipment used: None    Ambulation/Gait Ambulation/Gait assistance: Independent   Assistive device: None Gait Pattern/deviations: WFL(Within Functional Limits)   Gait velocity interpretation: >2.62 ft/sec, indicative of community ambulatory      Stairs Stairs: Yes Stairs assistance: Modified independent (Device/Increase time) Stair Management: One rail Right, One rail Left, Forwards Number of Stairs: 3       Balance Overall balance assessment: Independent       Pertinent Vitals/Pain Pain Assessment Pain Assessment: 0-10 Pain Score: 3  Pain Location: back Pain Descriptors / Indicators: Aching, Burning Pain Intervention(s): Monitored  during session    Home Living Family/patient expects to be discharged to:: Private residence Living Arrangements: Non-relatives/Friends (tennants) Available Help at Discharge: Friend(s);Family;Available PRN/intermittently Type of Home: House Home Access: Stairs to enter Entrance Stairs-Rails: Right Entrance Stairs-Number of Steps: 2   Home Layout: One level Home Equipment: Cane - single Librarian, Academic (2 wheels);BSC/3in1;Shower seat      Prior Function Prior Level of Function : Independent/Modified Independent;Driving             Mobility Comments: uses cane indoors, RW out of home ADLs Comments: Pt ind with ADLs and IADLs.     Extremity/Trunk Assessment   Upper Extremity Assessment Upper Extremity Assessment: Overall WFL for tasks assessed    Lower Extremity Assessment Lower Extremity Assessment: Overall WFL for tasks assessed    Cervical / Trunk Assessment Cervical / Trunk Assessment: Normal  Communication   Communication Communication: No apparent difficulties    Cognition Arousal: Alert Behavior During Therapy: WFL for tasks assessed/performed   PT - Cognitive impairments: No apparent impairments     Following commands: Intact       Cueing Cueing Techniques: Verbal cues            Assessment/Plan    PT Assessment Patient does not need any further PT services         PT Goals (Current goals can be found in the Care Plan section)  Acute Rehab PT Goals PT Goal Formulation: All assessment and education complete, DC therapy     AM-PAC PT 6 Clicks Mobility  Outcome Measure Help needed turning from your back to your side while in a flat bed without using bedrails?: None Help needed moving from lying  on your back to sitting on the side of a flat bed without using bedrails?: None Help needed moving to and from a bed to a chair (including a wheelchair)?: None Help needed standing up from a chair using your arms (e.g., wheelchair or  bedside chair)?: None Help needed to walk in hospital room?: None Help needed climbing 3-5 steps with a railing? : None 6 Click Score: 24    End of Session Equipment Utilized During Treatment: Gait belt Activity Tolerance: Patient tolerated treatment well Patient left: in bed;with call bell/phone within reach Nurse Communication: Mobility status      Time: 1030-1050 PT Time Calculation (min) (ACUTE ONLY): 20 min   Charges:   PT Evaluation $PT Eval Low Complexity: 1 Low   PT General Charges $$ ACUTE PT VISIT: 1 Visit         Dorothyann Maier, DPT, CLT  Acute Rehabilitation Services Office: [phone] (Secure chat preferred)   Dorothyann VEAR Maier 02/26/2024, 10:58 AM

## 2024-02-26 NOTE — Progress Notes (Addendum)
 HD#0 SUBJECTIVE:   Overnight Events: none  Interim History: She had a good nights rest after receiving her anxiety medicines and was peeing back to normal with the fluids. She has an appetite for lunch. Pain is getting better in her lower abdomen and back but still present. Discussed plan for PT to see her and reassess after she eats.   OBJECTIVE:  Vital Signs: Vitals:   02/25/24 1353 02/25/24 1710 02/25/24 2048 02/26/24 0427  BP: 107/65 (!) 97/38 (!) 109/56 (!) 100/49  Pulse: 91 95 (!) 104 87  Resp: 16 19  16   Temp: 98.1 F (36.7 C) 98.5 F (36.9 C) 98 F (36.7 C) (!) 97.5 F (36.4 C)  TempSrc: Oral  Oral   SpO2: 91% 100% 98% 100%  Weight:      Height:       Supplemental O2: Room Air SpO2: 100 %  Filed Weights   02/24/24 1949  Weight: 58.5 kg    No intake or output data in the 24 hours ending 02/26/24 0544 Net IO Since Admission: No IO data has been entered for this period [02/26/24 0544]  Physical Exam: Physical Exam Vitals reviewed.  Constitutional:      Comments: Lying in bed  Cardiovascular:     Rate and Rhythm: Normal rate and regular rhythm.     Pulses: Normal pulses.     Heart sounds: Normal heart sounds.  Pulmonary:     Effort: Pulmonary effort is normal.     Breath sounds: Normal breath sounds.  Abdominal:     General: Bowel sounds are normal.  Musculoskeletal:     Right lower leg: No edema.     Left lower leg: No edema.     Comments: TTP over BL lower back  Skin:    General: Skin is warm.  Neurological:     General: No focal deficit present.     Mental Status: She is alert and oriented to person, place, and time.  Psychiatric:        Mood and Affect: Mood normal.        Behavior: Behavior normal.     Patient Lines/Drains/Airways Status     Active Line/Drains/Airways     Name Placement date Placement time Site Days   Peripheral IV 02/25/24 20 G Right Antecubital 02/25/24  0514  Antecubital  1   Ileostomy RUQ 12/30/23  0000  RUQ  58             Pertinent labs and imaging:     Latest Ref Rng & Units 02/24/2024    7:54 PM 12/31/2023    3:51 AM 12/30/2023    9:41 AM  CBC  WBC 4.0 - 10.5 K/uL 10.9  8.3    Hemoglobin 12.0 - 15.0 g/dL 87.9  9.0  9.0   Hematocrit 36.0 - 46.0 % 37.5  28.6  28.7   Platelets 150 - 400 K/uL 428  408         Latest Ref Rng & Units 02/24/2024    7:54 PM 02/24/2024   11:10 AM 12/31/2023    3:51 AM  CMP  Glucose 70 - 99 mg/dL 898  97  98   BUN 6 - 20 mg/dL 14  15  9    Creatinine 0.44 - 1.00 mg/dL 8.25  8.11  8.69   Sodium 135 - 145 mmol/L 133  133  141   Potassium 3.5 - 5.1 mmol/L 3.8  3.8  3.9   Chloride 98 - 111  mmol/L 98  98  106   CO2 22 - 32 mmol/L 21  22  27    Calcium  8.9 - 10.3 mg/dL 89.9  89.7  9.1   Total Protein 6.5 - 8.1 g/dL 7.4   5.6   Total Bilirubin 0.0 - 1.2 mg/dL 0.8   0.7   Alkaline Phos 38 - 126 U/L 126   91   AST 15 - 41 U/L 24   21   ALT 0 - 44 U/L 27   22     CT ABDOMEN PELVIS WO CONTRAST Result Date: 02/25/2024 EXAM: CT ABDOMEN AND PELVIS WITHOUT CONTRAST 02/25/2024 06:13:18 AM TECHNIQUE: CT of the abdomen and pelvis was performed without the administration of intravenous contrast. Multiplanar reformatted images are provided for review. Automated exposure control, iterative reconstruction, and/or weight-based adjustment of the mA/kV was utilized to reduce the radiation dose to as low as reasonably achievable. COMPARISON: 10/17/2023 CLINICAL HISTORY: Bowel obstruction suspected. FINDINGS: LOWER CHEST: Scar-like density within the anterior right lower lobe has significantly decreased from 02/25/2023 compatible with resolving inflammation or infection. LIVER: The liver is unremarkable. GALLBLADDER AND BILE DUCTS: Status post cholecystectomy. No biliary ductal dilatation. SPLEEN: The spleen is within normal limits in size and appearance. PANCREAS: The pancreas is normal in size and contour without focal lesion or ductal dilatation. ADRENAL GLANDS: Normal size and  morphology bilaterally. No nodule, thickening, or hemorrhage. No periadrenal stranding. KIDNEYS, URETERS AND BLADDER: No stones in the kidneys or ureters. No hydronephrosis. No perinephric or periureteral stranding. Urinary bladder is unremarkable. GI AND BOWEL: Stomach demonstrates no acute abnormality. Status post subtotal colectomy with right lower quadrant ileostomy and Hartmann's pouch creation in the pelvis. No pathologic dilatation of the bowel loops. Equivocal wall thickening of the pelvic small bowel loops suboptimally evaluated due to lack of IV and enteric contrast material. Unchanged wall thickening along the left peritoneal reflection. Soft tissue stranding about the Hartmann's pouch with mild increased presacral soft tissue is stable from previous exam. These findings are favored to represent post-treatment change. Surgical clips noted in the left hemipelvis. PERITONEUM AND RETROPERITONEUM: No free fluid, fluid collections, or signs of pneumoperitoneum. VASCULATURE: Aorta is normal in caliber. LYMPH NODES: No lymphadenopathy. REPRODUCTIVE ORGANS: Status post hysterectomy. No adnexal mass. BONES AND SOFT TISSUES: No acute osseous abnormality. No focal soft tissue abnormality. IMPRESSION: 1. No evidence of bowel obstruction. 2. Equivocal wall thickening of the pelvic small bowel loops, limited by lack of IV and enteric contrast. Findings may reflect mild enteritis in this patient who has a history of known crohn disease. 3. Status post subtotal colectomy with right lower quadrant ileostomy and hartmann's pouch 4. Stable post-treatment changes along the left peritoneal reflection and around the Hartmanns pouch with mild presacral soft tissue thickening, favored postoperative change. Electronically signed by: Waddell Calk MD 02/25/2024 06:31 AM EDT RP Workstation: [id number]    ASSESSMENT/PLAN:  Assessment: Principal Problem:   Dehydration Active Problems:   COPD (chronic obstructive pulmonary  disease) (HCC)   Diabetes mellitus without complication (HCC)   Anxiety   AKI (acute kidney injury)   Essential hypertension   Seizure disorder (HCC)   Tobacco dependence   N&V (nausea and vomiting)   H/O ileostomy   History of pulmonary embolus (PE)   High output ileostomy (HCC)   Plan: [name] is a 53 y.o. 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 After 3L of NS yesterday, her Cr this morning 1.59 > 1.62 this afternoon (was 1.88 on admission). CRP wnl, ESR 23. She tolerated dinner last night and has an appetite for lunch. States she has peed a lot. PT saw her this morning and has no recommendations. 2g IV Mg given, improved to 1.6, will give 2 mg more. She has not needed the ondansetron .  -check orthostatics in AM -recheck BMP and Mg in AM -pending GI panel and calprotectin -Ondansetron  4 mg and scopolamine  patches -Psyllium TID -WOC following ostomy    Complicated Acute Cystitis  UA 10/24 with leukocytes and many bacteria. Error with culture. Will reorder. Pain is improving, she tolerating liquids and peeing more regularly. -pending new UA cx 10/25 -CTX 1 g for 3 days, EOT 10/27 -acetaminophen  500 mg   Normocytic Anemia Hgb 9.4 today. Appears to be her baseline. Discharge Hgb 9 from 12/2023 admission. She did have a bloody BM per rectum last night. Per chart, has a hx of rectal bleeding. 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 Recommended therapy is short-chain fatty acid enemas,  alternative is mesalamine  enemas 4g nightly for 4 weeks.     Leukocytosis, resolved 10.9>8.8 today. Other cell lines dropped as well. Initial leukocytosis likely 2/2 hemoconcentration that has now resolved with 3 L NS.    Crohns Disease Recurrent C. Diff Follows with [facility]. Previously on skyrizi, d/c after colectomy. Not currently on medication.    Seizure Disorder -Continue home Keppra  750 mg BID   PSVT HTN EKG 10/25 in NSR. 120/60 this morning. Holding home metoprolol  tartrate 25 mg daily for SVT Of note, last discharge in 12/2023, instructions were to discontinue metoprolol . Although she says she takes it. Has a history of hypotension and recommended to take midodrine  although she doesn't due to side effects.    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 Not currently on anticoagulation, per chart, she completed course. Saturating 100% on RA.  - on prophylaxis w/ 10 mg rivaroxaban   COPD Continue home incruse ellipta    GERD EDG with dilation in 06/2023. Continue home pantoprazole  40 mg BID   Chronic pain syndrome Continue home gabapentin  300 mg, cyclobenzaprine  10 mg TID   Anxiety Continue home alprazolam  0.5 mg BID and triazolam  0.25 mg at bedtime   Best Practice: Diet: Regular diet IVF: Fluids: 0.9NS, Rate: None VTE: rivaroxaban (XARELTO) tablet 10 mg Start: 02/25/24 1400 Code: Full  Disposition planning: Therapy Recs: None, DME: none Family Contact: daughter,  DISPO: Anticipated discharge today to Home pending clinical improvement.  Signature:  Viktoria Charmayne [facility]  5:44 AM, 02/26/2024  On Call pager [phone]

## 2024-02-27 ENCOUNTER — Other Ambulatory Visit (HOSPITAL_COMMUNITY): Payer: Self-pay

## 2024-02-27 DIAGNOSIS — Z932 Ileostomy status: Secondary | ICD-10-CM | POA: Diagnosis not present

## 2024-02-27 DIAGNOSIS — N3000 Acute cystitis without hematuria: Secondary | ICD-10-CM | POA: Diagnosis not present

## 2024-02-27 DIAGNOSIS — K50918 Crohn's disease, unspecified, with other complication: Secondary | ICD-10-CM | POA: Diagnosis not present

## 2024-02-27 DIAGNOSIS — N179 Acute kidney failure, unspecified: Secondary | ICD-10-CM | POA: Diagnosis not present

## 2024-02-27 LAB — BASIC METABOLIC PANEL WITH GFR
Anion gap: 10 (ref 5–15)
BUN: 8 mg/dL (ref 6–20)
CO2: 20 mmol/L — ABNORMAL LOW (ref 22–32)
Calcium: 8.7 mg/dL — ABNORMAL LOW (ref 8.9–10.3)
Chloride: 107 mmol/L (ref 98–111)
Creatinine, Ser: 1.66 mg/dL — ABNORMAL HIGH (ref 0.44–1.00)
GFR, Estimated: 36 mL/min — ABNORMAL LOW (ref >60)
Glucose, Bld: 102 mg/dL — ABNORMAL HIGH (ref 70–99)
Potassium: 3.3 mmol/L — ABNORMAL LOW (ref 3.5–5.1)
Sodium: 137 mmol/L (ref 135–145)

## 2024-02-27 LAB — GASTROINTESTINAL PANEL BY PCR, STOOL (REPLACES STOOL CULTURE)

## 2024-02-27 LAB — CBC
HCT: 30.9 % — ABNORMAL LOW (ref 36.0–46.0)
Hemoglobin: 10 g/dL — ABNORMAL LOW (ref 12.0–15.0)
MCH: 26.7 pg (ref 26.0–34.0)
MCHC: 32.4 g/dL (ref 30.0–36.0)
MCV: 82.4 fL (ref 80.0–100.0)
Platelets: 390 K/uL (ref 150–400)
RBC: 3.75 MIL/uL — ABNORMAL LOW (ref 3.87–5.11)
RDW: 14.9 % (ref 11.5–15.5)
WBC: 8.6 K/uL (ref 4.0–10.5)
nRBC: 0 % (ref 0.0–0.2)

## 2024-02-27 LAB — MAGNESIUM: Magnesium: 2.1 mg/dL (ref 1.7–2.4)

## 2024-02-27 MED ORDER — PSYLLIUM 95 % PO PACK
1.0000 | PACK | Freq: Three times a day (TID) | ORAL | 0 refills | Status: AC
  Filled 2024-02-27: qty 90, 30d supply, fill #0

## 2024-02-27 MED ORDER — POTASSIUM CHLORIDE 20 MEQ PO PACK
40.0000 meq | PACK | Freq: Once | ORAL | Status: AC
  Administered 2024-02-27: 40 meq via ORAL
  Filled 2024-02-27: qty 2

## 2024-02-27 MED ORDER — MIDODRINE HCL 10 MG PO TABS: 10.0000 mg | ORAL_TABLET | ORAL | Status: AC | PRN

## 2024-02-27 MED ORDER — MAG-OXIDE 200 MG PO TABS
200.0000 mg | ORAL_TABLET | Freq: Every day | ORAL | 0 refills | Status: AC
  Filled 2024-02-27: qty 14, 14d supply, fill #0

## 2024-02-27 MED ORDER — LOPERAMIDE HCL 1 MG/7.5ML PO SUSP
1.0000 mg | ORAL | 0 refills | Status: AC | PRN
  Filled 2024-02-27: qty 120, 15d supply, fill #0

## 2024-02-27 NOTE — TOC Transition Note (Signed)
 Transition of Care [facility]) - Discharge Note   Patient Details  Name: [name] MRN: [id number] Date of Birth: [birth year]  Transition of Care [facility]) CM/SW Contact:  Rosaline JONELLE Joe, RN Phone Number: 02/27/2024, 11:08 AM   Clinical Narrative:    CM met with the patient prior to discharge to home today.   The patient was provided wth Ostomy supplies for home by [facility] RN.  Patient plans to follow up with her PCP - Dr. Rocky.  Patient is independent and plans to drive home. No other IP Care management needs at this time.         Patient Goals and CMS Choice            Discharge Placement                       Discharge Plan and Services Additional resources added to the After Visit Summary for                                       Social Drivers of Health (SDOH) Interventions SDOH Screenings   Food Insecurity: No Food Insecurity (02/25/2024)  Housing: Low Risk  (02/25/2024)  Transportation Needs: No Transportation Needs (02/25/2024)  Utilities: Not At Risk (02/25/2024)  Financial Resource Strain: Medium Risk (06/09/2023)   Received from [facility]  Physical Activity: Inactive (06/09/2023)   Received from [facility]  Social Connections: Socially Isolated (06/09/2023)   Received from [facility]  Stress: Stress Concern Present (06/09/2023)   Received from [facility]  Tobacco Use: High Risk (02/24/2024)  Health Literacy: Medium Risk (06/09/2023)   Received from [facility]     Readmission Risk Interventions    02/27/2024   11:08 AM 10/18/2023    8:25 AM 06/21/2023    2:07 PM  Readmission Risk Prevention Plan  Transportation Screening Complete Complete Complete  Medication Review (RN Care Manager) Complete Complete Referral to Pharmacy  PCP or Specialist appointment within 3-5 days of discharge Complete  Complete  HRI or Home Care Consult Complete Complete Complete  SW Recovery Care/Counseling Consult Complete   Complete  Palliative Care Screening Not Applicable Not Applicable Not Applicable  Skilled Nursing Facility Not Applicable Not Applicable

## 2024-02-27 NOTE — Consult Note (Signed)
 WOC Nurse ostomy follow up Stoma type/location:  RMQ colostomy (per OR report) high output.  Patient states she was told it was a colostomy and not ileostomy.    Admitted Friday for dehydration and complex UTI.  She is in bed asleep and groggy this AM.  She is  not sure if she will be discharged today.  Stomal assessment/size: 1 1/4 pink and moist  high output Peristomal assessment:  Friday, skin was denuded and switched to 1 piece high output pouch with tapeless backing [employer])  Has stayed in place.  Skin is no longer burning beneath pouch, she states.  Optimistic dermatitis is resolving with new pouch.  Treatment options for stomal/peristomal skin: Stopped barrier strips, skin denuded beneath.  Stoma powder and skin prep to irritation.  Barrier ring 1 piece high output convex pouch and secure with belt.  This pouch has held 3 days.  Output liquid brown stool with some soft but solid particulates.  Bedside drainage bag milked to allow stool to drain.   Ostomy pouching: 1pc. Convex high output Education provided: Patient is groggy and nonparticipative  she does not want pouch changed at this time.   Enrolled patient in [employer] Discharge program: previously Will not follow at this time.  Please re-consult if needed.  Darice Cooley MSN, RN, FNP-BC CWON Wound, Ostomy, Continence Nurse [facility] [phone] Work cell phone:  [phone]

## 2024-02-27 NOTE — Progress Notes (Signed)
 DISCHARGE NOTE HOME [name] to be discharged Home per MD order. Discussed prescriptions and follow up appointments with the patient. Prescriptions given to patient; medication list explained in detail. Patient verbalized understanding.  Skin clean, dry and intact without evidence of skin break down, no evidence of skin tears noted. IV catheter discontinued intact. Site without signs and symptoms of complications. Dressing and pressure applied. Pt denies pain at the site currently. No complaints noted.  Discharging with existing illiostomy Patient free of other lines, drains, and wounds.   An After Visit Summary (AVS) was printed and given to the patient. Patient escorted via wheelchair, and discharged home via private auto.  Peyton SHAUNNA Pepper, RN

## 2024-02-27 NOTE — Discharge Summary (Addendum)
 Name: [name] MRN: [id number] DOB: 1971/02/21 53 y.o. PCP: Job Browning, PA-C  Date of Admission: 02/24/2024  7:20 PM Date of Discharge: 02/27/2024 Attending Physician: Dr. Jone Dauphin  Discharge Diagnosis: 1. Principal Problem:   Dehydration Active Problems:   COPD (chronic obstructive pulmonary disease) (HCC)   Diabetes mellitus without complication (HCC)   Anxiety   AKI (acute kidney injury)   Essential hypertension   Seizure disorder (HCC)   Tobacco dependence   N&V (nausea and vomiting)   H/O ileostomy   History of pulmonary embolus (PE)   High output ileostomy (HCC)   Discharge Medications: Allergies as of 02/27/2024       Reactions   Codeine Shortness Of Breath, Swelling, Rash   Throat swelling   Hydrocodone  Shortness Of Breath, Swelling, Rash   Ketorolac  Nausea And Vomiting, Nausea Only, Swelling   Pt reports n/v and swelling to throat   Morphine And Codeine Shortness Of Breath, Swelling, Rash   Penicillins Shortness Of Breath, Swelling, Other (See Comments)   Throat swells 02/06/21--TOLERATES CEFTRIAXONE     Ativan  [lorazepam ] Other (See Comments)   Migraines.   Oxycodone -acetaminophen  Nausea And Vomiting   Pregabalin  Other (See Comments)   Unknown    Strawberry Extract Swelling   Watermelon Concentrate Swelling   Albuterol  Palpitations   Atarax  [hydroxyzine ] Itching, Swelling   Mildly swollen throat   Benadryl  [diphenhydramine  Hcl] Palpitations   Humira  [adalimumab ] Rash   Latex Rash   Nickel Rash   Remicade  [infliximab ] Rash   Blisters and Welts   Tramadol  Rash   Rash and itching        Medication List     PAUSE taking these medications    metoprolol  tartrate 25 MG tablet Wait to take this until your doctor or other care provider tells you to start again. Commonly known as: LOPRESSOR  Take 25 mg by mouth.       STOP taking these medications    loperamide  2 MG tablet Commonly known as: Imodium  A-D Replaced by:  loperamide  HCl 1 MG/7.5ML suspension       TAKE these medications    Ventolin  HFA 108 (90 Base) MCG/ACT inhaler Generic drug: albuterol  Inhale 1 puff into the lungs every 6 (six) hours as needed for wheezing or shortness of breath.   albuterol  1.25 MG/3ML nebulizer solution Commonly known as: ACCUNEB  Take 1 ampule by nebulization every 6 (six) hours as needed for wheezing or shortness of breath.   ALPRAZolam  1 MG tablet Commonly known as: XANAX  Take 0.5 tablets (0.5 mg total) by mouth 2 (two) times daily as needed for anxiety. What changed:  how much to take when to take this   amitriptyline  100 MG tablet Commonly known as: ELAVIL  Take 100 mg by mouth at bedtime.   cefdinir  300 MG capsule Commonly known as: OMNICEF  Take 1 capsule (300 mg total) by mouth 2 (two) times daily.   cetirizine 10 MG tablet Commonly known as: ZYRTEC Take 10 mg by mouth daily.   cyanocobalamin  1000 MCG/ML injection Commonly known as: VITAMIN B12 Inject 1 mL (1,000 mcg total) into the skin daily for 4 days, THEN 1 mL (1,000 mcg total) every 30 (thirty) days. Start taking on: January 01, 2024   cyclobenzaprine  10 MG tablet Commonly known as: FLEXERIL  Take 10 mg by mouth 3 (three) times daily.   feeding supplement Liqd Take 237 mLs by mouth 2 (two) times daily between meals.   gabapentin  300 MG capsule Commonly known as: NEURONTIN  Take 300 mg  by mouth daily as needed (for pain).   levETIRAcetam  750 MG tablet Commonly known as: KEPPRA  Take 750 mg by mouth 2 (two) times daily.   loperamide  HCl 1 MG/7.5ML suspension Commonly known as: IMODIUM  Take 7.5 mLs (1 mg total) by mouth as needed for diarrhea or loose stools. Max daily dose is 16 mg per day, do not exceed more than 16 mg per day. Replaces: loperamide  2 MG tablet   Mag-Oxide 200 MG Tabs Generic drug: Magnesium  Oxide -Mg Supplement Take 1 tablet (200 mg total) by mouth daily. You may stop taking this after your diarrhea has stopped  or until your PCP instructs you to stop the medicine   midodrine  10 MG tablet Commonly known as: PROAMATINE  Take 1 tablet (10 mg total) by mouth as needed (for dizziness or lightheadedness). What changed:  when to take this reasons to take this   nitroGLYCERIN  0.4 MG SL tablet Commonly known as: NITROSTAT  Place 1 tablet (0.4 mg total) under the tongue every 5 (five) minutes as needed for chest pain.   ondansetron  4 MG disintegrating tablet Commonly known as: ZOFRAN -ODT Take 1 tablet (4 mg total) by mouth every 8 (eight) hours as needed for nausea or vomiting.   pantoprazole  40 MG tablet Commonly known as: PROTONIX  Take 1 tablet (40 mg total) by mouth 2 (two) times daily.   psyllium 95 % Pack Commonly known as: HYDROCIL/METAMUCIL Take 1 packet by mouth 3 (three) times daily.   scopolamine  1 MG/3DAYS Commonly known as: TRANSDERM-SCOP Place 1 patch onto the skin every 3 (three) days. As needed   Spiriva  Respimat 2.5 MCG/ACT Aers Generic drug: Tiotropium Bromide  Inhale 2 puffs into the lungs daily.   traZODone  50 MG tablet Commonly known as: DESYREL  Take 50 mg by mouth at bedtime.   triazolam  0.25 MG tablet Commonly known as: HALCION  Take 0.25 mg by mouth at bedtime.        Disposition and follow-up:   [name] was discharged from [facility] in Stable condition.  At the hospital follow up visit please address:  1.  AKI 2/2 high RMQ ostomy output: Assess output and tolerance of daily 1 mg Imodium  and / or Metamucil. She was discharged with both. Assess volume status and symptoms of dehydration.  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 Ensure Hgb stable and assess need for treatment.   Hypotension and PSVT: Paused her metoprolol  on discharge due to low/normal pressures. Assess need to resume.  She was previously prescribed midodrine  TID but wasn't taking it at all because of side effects. Also recommended her to take  midodrine  as needed for dizziness. Acute Cystitis: Ensure resolution of symptoms and completion of antibiotic course (2 more days of Cefdinir  BID).  Hypokalemia and Hypomagnesemia: Recheck BMP. Given PO magnesium  on discharge.   2.  Labs / imaging needed at time of follow-up: BMP, CBC, Magnesium    3.  Pending labs/ test needing follow-up: fecal calprotectin  Follow-up Appointments:  Follow-up Information     [employer], [employer], PA-C. Go on 03/02/2024.   Specialty: Gastroenterology Why: at 2:40PM Contact information: [address] [place] [address] [phone]         Job Browning, PA-C. Call today.   Specialty: Physician Assistant Why: to make a hospital follow up appointment Contact information: [address] [place] [address] [phone]                  Hospital Course  by problem list: Mahli Kyrstin Campillo is a 53 y.o. 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 She was dry on initial presentation and had lightheadedness on standing. She also hadn't had the urge to pee in a long time. Patient says she's had high ostomy output as well. She is supposed to be taking imodium  three times daily but doesn't because it clogs up the stoma. Per chart, has several hospitalizations for similar presentation. On 10/24, Na 133 CO2 21 GFR 34, otherwise wnl. Mg 1.2. CRP wnl, ESR 23. C diff negative. Suspect increased RMQ ileostomy output is due to a viral gastroenteritis vs med  noncompliance with imodium  vs less likely a Crohns flare. Suspect patient's AKI is predominantly prerenal due to dehydration and anorexia. She was given 3 L on day of admission with improvement in Cr to 1.66 on day of discharge. Gave 2 mg Mg IV with recheck Mg 2.1, patient was discharged home with oral magnesium . Patient ambulated well with PT and they had no recommendations. Will discharge patient on a smaller dose of Imodium  1 mg daily instead of TID. Hopefully this will minimize side effects and prevent future dehydration and resulting AKIs.    Complicated Acute Cystitis  Presented with feelings of urge without successful void. Additionally had pain in BL groin that radiates to her vagina with BL lower back pain. CTAP on admission w/o signs of pyelonephritis. UA 10/24 cloudy with large leukocytes and many bacteria, lab error obtaining culture. Repeat UA after two days of abx was clear with trace leukocytes and rare bacteria. Completed 3 days of IV CTX in hospital with improving abdominal pain. Will discharge with 2 additional days of PO Cefdinir  300 mg BID to complete 5 day course for complicated cystitis.   PSVT Hypotension EKG 10/25 in NSR. Did not have signs of chest pain or palpitations. Held home metoprolol  25 mg while BP low on presentation and low/normal throughout admission without metoprolol . Will pause on discharge with instructions to not restart until a provider tells her to resume. Per chart, has been hospitalized several times in the last year with low pressures. Orthostatics on day of discharge positive. Has midodrine  10 mg Q8HR but she doesn't take it because it makes her feel hot and her face turn red. Told her to try taking it when she feels dizzy to see if it helps, as opposed to taking TID. Also discussed using caution when changing position from sitting to standing. Patient understands. She was provided instructions on Valsalva Maneuvers.   Leukocytosis, resolved 10.9>8.6 day of  discharge. Other cell lines dropped as well. Initial leukocytosis likely 2/2 hemoconcentration that has now resolved with 3 L NS. Afebrile throughout admission.   Normocytic Anemia Hgb 10 on day of discharge. Appears to be her baseline. Discharge Hgb 9 from 12/2023 admission. She did have a bloody BM per rectum. Per chart, has a hx of rectal bleeding. 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 Recommended therapy is short-chain fatty acid enemas, alternative is mesalamine  enemas 4g  nightly for 4 weeks. Hgb stable. Recommend GI follow up.    Crohns Disease Recurrent C. Diff Per chart, Underwent total colectomy leaving about 15 cm of rectal stump. Permanent ileostomy created.  Postop course complicated by fungal bacteremia. She has an appointment with [facility] this coming Friday for her ostomy bag. Previously on skyrizi, this was discontinued after colectomy, not currently on medication for Crohns.    Seizure Disorder Can't remember the last time she's had a seizure. Compliant with medications. Did not have a seizure or signs of seizure during admission. Home Keppra  750 mg BID.    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 Not currently on anticoagulation, per chart, it was last prescribed in 06/2023 and that she completed course for provoked clot. While in hospital, on prophylaxis with rivaroxaban 10 mg. She was tachycardic at times but no signs of DVT, saturating high on room air. Suspect tachy due to presenting hypovolemia.    COPD Continue home incruse ellipta    GERD EDG with dilation in 06/2023. Continue home pantoprazole  40 mg BID   Chronic pain syndrome Continue home gabapentin  300 mg, cyclobenzaprine  10 mg TID   Anxiety Continue home alprazolam  0.5 mg BID and triazolam  0.25 mg at bedtime     Subjective Has been able to catch up on her sleep throughout admission which she says  she needed. She ate dinner last night and has been drinking fluids and peeing. She continues to have mild abdominal pain and lower back pain but pain is improving. Discussed plan for discharge and importance of follow up with GI doctor and PCP.   Discharge Exam:   BP 107/52  Pulse 97   Temp 97.6 F (36.4 C)   Resp 18   Ht 5' 5 (1.651 m)   Wt 58.5 kg   SpO2 96%   BMI 21.47 kg/m  Discharge exam:  Vitals reviewed.  Constitutional:      Appearance: Normal appearance.  HENT:     Nose: Nose normal.     Mouth/Throat:     Mouth: Mucous membranes are moist.     Pharynx: Oropharynx is clear.  Cardiovascular:     Rate and Rhythm: Normal rate and regular rhythm.  Pulmonary:     Effort: Pulmonary effort is normal.  Abdominal:     Tenderness: There is abdominal tenderness. Ostomy in place with liquid brown output.  Musculoskeletal:        General: Normal range of motion.     Right lower leg: No edema.     Left lower leg: No edema.  Skin:    General: Skin is warm and dry.  Neurological:     General: No focal deficit present.     Mental Status: She is alert and oriented to person, place, and time.  Psychiatric:        Mood and Affect: Mood normal.        Behavior: Behavior normal.     Pertinent Labs, Studies, and Procedures:     Latest Ref Rng & Units 02/27/2024    4:48 AM 02/26/2024    5:30 AM 02/24/2024    7:54 PM  CBC  WBC 4.0 - 10.5 K/uL 8.6  8.8  10.9   Hemoglobin 12.0 - 15.0 g/dL 89.9  9.4  87.9   Hematocrit 36.0 - 46.0 % 30.9  29.2  37.5   Platelets 150 - 400 K/uL 390  384  428  Latest Ref Rng & Units 02/27/2024    4:48 AM 02/26/2024    2:48 PM 02/26/2024    5:30 AM  CMP  Glucose 70 - 99 mg/dL 897  888  96   BUN 6 - 20 mg/dL 8  8  11    Creatinine 0.44 - 1.00 mg/dL 8.33  8.37  8.40   Sodium 135 - 145 mmol/L 137  135  140   Potassium 3.5 - 5.1 mmol/L 3.3  3.5  3.7   Chloride 98 - 111 mmol/L 107  106  109   CO2 22 - 32 mmol/L 20  19  21    Calcium  8.9 -  10.3 mg/dL 8.7  8.7  8.6     CT ABDOMEN PELVIS WO CONTRAST Result Date: 02/25/2024 EXAM: CT ABDOMEN AND PELVIS WITHOUT CONTRAST 02/25/2024 06:13:18 AM TECHNIQUE: CT of the abdomen and pelvis was performed without the administration of intravenous contrast. Multiplanar reformatted images are provided for review. Automated exposure control, iterative reconstruction, and/or weight-based adjustment of the mA/kV was utilized to reduce the radiation dose to as low as reasonably achievable. COMPARISON: 10/17/2023 CLINICAL HISTORY: Bowel obstruction suspected. FINDINGS: LOWER CHEST: Scar-like density within the anterior right lower lobe has significantly decreased from 02/25/2023 compatible with resolving inflammation or infection. LIVER: The liver is unremarkable. GALLBLADDER AND BILE DUCTS: Status post cholecystectomy. No biliary ductal dilatation. SPLEEN: The spleen is within normal limits in size and appearance. PANCREAS: The pancreas is normal in size and contour without focal lesion or ductal dilatation. ADRENAL GLANDS: Normal size and morphology bilaterally. No nodule, thickening, or hemorrhage. No periadrenal stranding. KIDNEYS, URETERS AND BLADDER: No stones in the kidneys or ureters. No hydronephrosis. No perinephric or periureteral stranding. Urinary bladder is unremarkable. GI AND BOWEL: Stomach demonstrates no acute abnormality. Status post subtotal colectomy with right lower quadrant ileostomy and Hartmann's pouch creation in the pelvis. No pathologic dilatation of the bowel loops. Equivocal wall thickening of the pelvic small bowel loops suboptimally evaluated due to lack of IV and enteric contrast material. Unchanged wall thickening along the left peritoneal reflection. Soft tissue stranding about the Hartmann's pouch with mild increased presacral soft tissue is stable from previous exam. These findings are favored to represent post-treatment change. Surgical clips noted in the left hemipelvis.  PERITONEUM AND RETROPERITONEUM: No free fluid, fluid collections, or signs of pneumoperitoneum. VASCULATURE: Aorta is normal in caliber. LYMPH NODES: No lymphadenopathy. REPRODUCTIVE ORGANS: Status post hysterectomy. No adnexal mass. BONES AND SOFT TISSUES: No acute osseous abnormality. No focal soft tissue abnormality. IMPRESSION: 1. No evidence of bowel obstruction. 2. Equivocal wall thickening of the pelvic small bowel loops, limited by lack of IV and enteric contrast. Findings may reflect mild enteritis in this patient who has a history of known crohn disease. 3. Status post subtotal colectomy with right lower quadrant ileostomy and hartmann's pouch 4. Stable post-treatment changes along the left peritoneal reflection and around the Hartmanns pouch with mild presacral soft tissue thickening, favored postoperative change. Electronically signed by: Waddell Calk MD 02/25/2024 06:31 AM EDT RP Workstation: [id number]     Discharge Instructions: Discharge Instructions     Call MD for:  persistant dizziness or light-headedness   Complete by: As directed    Diet general   Complete by: As directed    Discharge instructions   Complete by: As directed    Thank you for allowing us  to be part of your care. You were hospitalized for dehydration and acute kidney injury. We treated you with fluids and  antibiotics.   See the changes in your medications and management of your chronic conditions below:  *For your ostomy output -I understand the issues you've had using the Imodium . To prevent future dehydration from high ostomy output, I recommend you try taking a smaller dose of Imodium , 1 mg, and only once a day. I hope this will increase the stool bulk without blocking up the stoma hole.  -I have also sent you home with some Metamucil to try.  -Take one table of magnesium  oxide daily until your diarrhea improves or until your PCP instructs you to stop the magnesium  oxide   *For your bladder  infection -Please take one tablet of Cefdinir , twice a day, on Tuesday and Wednesday. This will complete a 5 day course of antibiotics.  *For your blood pressure - Please stop taking the metoprolol  until you're able to follow up with your primary care provider or cardiologist.  - Please take one tablet of midodrine , as needed, if you feel lightheaded or dizzy.   FOLLOW UP APPOINTMENTS: -Please go to your GI appointment at [facility] this Friday at 2:40PM -Please see your primary care provider within 7-10 days for a hospital follow up appointment  Please call your PCP or our clinic if you have any questions or concerns, we may be able to help and keep you from a long and expensive emergency room wait. Our clinic and after hours phone number is [phone]. The best time to call is Monday through Friday 9 am to 4 pm but there is always someone available 24/7 if you have an emergency. If you need medication refills please notify your pharmacy one week in advance and they will send us  a request.   We are glad you are feeling better,  Viktoria King Internal Medicine Inpatient Teaching Service at [facility]   Increase activity slowly   Complete by: As directed        Signed: King Viktoria, DO 02/27/2024, 11:44 AM

## 2024-02-27 NOTE — Discharge Instructions (Addendum)
 Thank you for allowing us  to be part of your care. You were hospitalized for dehydration and acute kidney injury. We treated you with fluids and antibiotics.   See the changes in your medications and management of your chronic conditions below:  *For your ostomy output -I understand the issues you've had using the Imodium . To prevent future dehydration from high ostomy output, I recommend you try taking a smaller dose of Imodium , 1 mg, and only once a day. I hope this will increase the stool bulk without blocking up the stoma hole.  -I have also sent you home with some Metamucil to try.  -Take one table of magnesium  oxide daily until your diarrhea improves or until your PCP instructs you to stop the magnesium  oxide  *For your bladder infection -Please take one tablet of Cefdinir , twice a day, on Tuesday and Wednesday. This will complete a 5 day course of antibiotics.   *For your blood pressure - Please stop taking the metoprolol  until you're able to follow up with your primary care provider or cardiologist.  - Please take one tablet of midodrine , as needed, if you feel lightheaded or dizzy.   *For the SVT Please hold off on the metoprolol  until your PCP or cardiologist restarts this. You may try Valsalva Maneuvers. How to Do the Valsalva Maneuver Your doctor will tell you to: Sit down or lie down. Take a deep breath and hold it by closing your throat. Bear down hard, as if you're trying to go to the bathroom. Strain hard for about 10 to 15 seconds. Release the air when you're done. Wait at least a minute before you try again.  FOLLOW UP APPOINTMENTS: -Please go to your GI appointment at [facility] this Friday at 2:40PM -Please see your primary care provider within 7-10 days for a hospital follow up appointment  Please call your PCP or our clinic if you have any questions or concerns, we may be able to help and keep you from a long and expensive emergency room wait. Our clinic and after hours  phone number is [phone]. The best time to call is Monday through Friday 9 am to 4 pm but there is always someone available 24/7 if you have an emergency. If you need medication refills please notify your pharmacy one week in advance and they will send us  a request.   We are glad you are feeling better,  [name] Internal Medicine Inpatient Teaching Service at [facility]

## 2024-02-28 ENCOUNTER — Other Ambulatory Visit (INDEPENDENT_AMBULATORY_CARE_PROVIDER_SITE_OTHER): Payer: Self-pay | Admitting: Gastroenterology

## 2024-02-28 ENCOUNTER — Ambulatory Visit (HOSPITAL_COMMUNITY): Admitting: Nurse Practitioner

## 2024-02-29 LAB — CALPROTECTIN, FECAL: Calprotectin, Fecal: 19 ug/g (ref 0–120)

## 2024-03-01 ENCOUNTER — Encounter (INDEPENDENT_AMBULATORY_CARE_PROVIDER_SITE_OTHER): Payer: Self-pay | Admitting: Gastroenterology

## 2024-03-07 ENCOUNTER — Encounter (INDEPENDENT_AMBULATORY_CARE_PROVIDER_SITE_OTHER): Payer: Self-pay | Admitting: Gastroenterology

## 2024-03-14 ENCOUNTER — Other Ambulatory Visit (HOSPITAL_COMMUNITY): Payer: Self-pay | Admitting: Nurse Practitioner

## 2024-03-14 DIAGNOSIS — L24B3 Irritant contact dermatitis related to fecal or urinary stoma or fistula: Secondary | ICD-10-CM

## 2024-03-14 DIAGNOSIS — R198 Other specified symptoms and signs involving the digestive system and abdomen: Secondary | ICD-10-CM

## 2024-04-03 ENCOUNTER — Other Ambulatory Visit (HOSPITAL_COMMUNITY): Payer: Self-pay | Admitting: Nurse Practitioner

## 2024-04-03 DIAGNOSIS — L24B3 Irritant contact dermatitis related to fecal or urinary stoma or fistula: Secondary | ICD-10-CM

## 2024-04-03 DIAGNOSIS — R198 Other specified symptoms and signs involving the digestive system and abdomen: Secondary | ICD-10-CM

## 2024-04-06 ENCOUNTER — Encounter (HOSPITAL_COMMUNITY): Payer: Self-pay | Admitting: Emergency Medicine

## 2024-04-06 ENCOUNTER — Emergency Department (HOSPITAL_COMMUNITY)
Admission: EM | Admit: 2024-04-06 | Discharge: 2024-04-06 | Disposition: A | Discharge status: Home / Self Care | Attending: Emergency Medicine | Admitting: Emergency Medicine

## 2024-04-06 ENCOUNTER — Other Ambulatory Visit: Payer: Self-pay

## 2024-04-06 DIAGNOSIS — Z91040 Latex allergy status: Secondary | ICD-10-CM | POA: Insufficient documentation

## 2024-04-06 DIAGNOSIS — Z433 Encounter for attention to colostomy: Secondary | ICD-10-CM | POA: Insufficient documentation

## 2024-04-06 NOTE — ED Triage Notes (Signed)
 Pt to the ED with a need for colostomy bag replacement.

## 2024-04-06 NOTE — Discharge Instructions (Addendum)
 Reach out to your provider regarding additional ostomy supplies.

## 2024-04-06 NOTE — ED Provider Notes (Signed)
 [place] EMERGENCY DEPARTMENT AT [facility] Provider Note   CSN: [id number] Arrival date & time: 04/06/24  2101     Patient presents with: Colostomy Bag Replacement   [name] is a 53 y.o. female.  Patient is here for ostomy supplies.  She has an ileostomy in place and is managed by [facility] gastroenterology.  Patient states that she ran out of supplies earlier today.  She denies any medical complaints.  She has not called her established provider for refill of supplies stating it just happened today.  She denies financial barriers to accessing supplies further stating she was recently prescribed new bags which are typically mailed to her home and they had not arrived yet today leading her to come in seeking supplies.  The history is provided by the patient.       Prior to Admission medications   Medication Sig Start Date End Date Taking? Authorizing Provider  albuterol  (ACCUNEB ) 1.25 MG/3ML nebulizer solution Take 1 ampule by nebulization every 6 (six) hours as needed for wheezing or shortness of breath. 04/21/23   [provider]  ALPRAZolam  (XANAX ) 1 MG tablet Take 0.5 tablets (0.5 mg total) by mouth 2 (two) times daily as needed for anxiety. Patient taking differently: Take 1 mg by mouth 3 (three) times daily. 06/28/23   Elgergawy, Brayton RAMAN, MD  amitriptyline  (ELAVIL ) 100 MG tablet Take 100 mg by mouth at bedtime.    [provider]  cefdinir  (OMNICEF ) 300 MG capsule Take 1 capsule (300 mg total) by mouth 2 (two) times daily. 02/25/24   Raford Lenis, MD  cetirizine (ZYRTEC) 10 MG tablet Take 10 mg by mouth daily. 01/08/22   [provider]  cyanocobalamin  (VITAMIN B12) 1000 MCG/ML injection Inject 1 mL (1,000 mcg total) into the skin daily for 4 days, THEN 1 mL (1,000 mcg total) every 30 (thirty) days. 01/01/24 07/03/24  Maree, Pratik D, DO  cyclobenzaprine  (FLEXERIL ) 10 MG tablet Take 10 mg by mouth 3 (three) times daily.    [provider]  feeding supplement (ENSURE ENLIVE / ENSURE PLUS) LIQD Take 237 mLs by mouth 2 (two) times daily between meals. 01/04/22   Ricky Fines, MD  gabapentin  (NEURONTIN ) 300 MG capsule Take 300 mg by mouth daily as needed (for pain). 10/15/22   [provider]  levETIRAcetam  (KEPPRA ) 750 MG tablet Take 750 mg by mouth 2 (two) times daily. 09/30/20   [provider]  loperamide  HCl (IMODIUM ) 1 MG/7.5ML suspension Take 7.5 mLs (1 mg total) by mouth as needed for diarrhea or loose stools. Max daily dose is 16 mg per day, do not exceed more than 16 mg per day. 02/27/24   Charmayne Holmes, DO  Magnesium  Oxide -Mg Supplement (MAG-OXIDE) 200 MG TABS Take 1 tablet (200 mg total) by mouth daily. You may stop taking this after your diarrhea has stopped or until your PCP instructs you to stop the medicine 02/27/24   Kandis Perkins, DO  metoprolol  tartrate (LOPRESSOR ) 25 MG tablet Take 25 mg by mouth. 02/01/24   [provider]  nitroGLYCERIN  (NITROSTAT ) 0.4 MG SL tablet Place 1 tablet (0.4 mg total) under the tongue every 5 (five) minutes as needed for chest pain. 04/25/23   Johnson, Clanford L, MD  ondansetron  (ZOFRAN -ODT) 4 MG disintegrating tablet Take 1 tablet (4 mg total) by mouth every 8 (eight) hours as needed for nausea or vomiting. 02/25/24   Raford Lenis, MD  pantoprazole  (PROTONIX ) 40 MG tablet Take 1 tablet (  40 mg total) by mouth 2 (two) times daily. 02/28/24   Eartha Angelia Sieving, MD  psyllium (HYDROCIL/METAMUCIL) 95 % PACK Take 1 packet by mouth 3 (three) times daily. 02/27/24   Charmayne Holmes, DO  scopolamine  (TRANSDERM-SCOP) 1 MG/3DAYS Place 1 patch onto the skin every 3 (three) days. As needed    [provider]  SPIRIVA  RESPIMAT 2.5 MCG/ACT AERS Inhale 2 puffs into the lungs daily.    [provider]  traZODone  (DESYREL ) 50 MG tablet Take 50 mg by mouth at bedtime.    [provider]  triazolam  (HALCION ) 0.25 MG tablet Take 0.25 mg  by mouth at bedtime. 10/08/23   [provider]  VENTOLIN  HFA 108 (90 Base) MCG/ACT inhaler Inhale 1 puff into the lungs every 6 (six) hours as needed for wheezing or shortness of breath.    [provider]    Allergies: Codeine, Hydrocodone , Ketorolac , Morphine and codeine, Penicillins, Ativan  [lorazepam ], Oxycodone -acetaminophen , Pregabalin , Strawberry extract, Watermelon concentrate, Albuterol , Atarax  [hydroxyzine ], Benadryl  [diphenhydramine  hcl], Humira  [adalimumab ], Latex, Nickel, Remicade  [infliximab ], and Tramadol     Review of Systems  All other systems reviewed and are negative.   Updated Vital Signs BP (!) 120/55   Pulse 92   Temp (!) 96.8 F (36 C)   Resp 18   Ht 5' 6 (1.676 m)   SpO2 100%   BMI 20.82 kg/m   Physical Exam Vitals and nursing note reviewed.  Constitutional:      General: She is not in acute distress.    Appearance: Normal appearance. She is not ill-appearing, toxic-appearing or diaphoretic.  HENT:     Head: Normocephalic and atraumatic.  Eyes:     General: No scleral icterus.    Extraocular Movements: Extraocular movements intact.     Conjunctiva/sclera: Conjunctivae normal.  Pulmonary:     Effort: Pulmonary effort is normal. No respiratory distress.  Abdominal:     Comments: Patient actively placing colostomy bag over her ileostomy.  Ileostomy appears healthy and is actively draining fecal material without issue.   Musculoskeletal:        General: Normal range of motion.  Skin:    General: Skin is warm and dry.     Coloration: Skin is not jaundiced or pale.  Neurological:     Mental Status: She is alert and oriented to person, place, and time.     (all labs ordered are listed, but only abnormal results are displayed) Labs Reviewed - No data to display  EKG: None  Radiology: No results found.   Procedures   Medications Ordered in the ED - No data to display   Patient presents to the ED for concern of need for  ostomy supplies without medical complaint.   Additional history obtained:  Additional history obtained from Outside Medical Records   External records from outside source obtained and reviewed including GI visit notes   Problem List / ED Course:  Need for ostomy supplies.  Patient's ordered ostomy supplies did not arrive by mail today as expected so she ran out.  Patient was given ostomy supplies in ED today.  She denied any medical complaints.   Reevaluation:  After the interventions noted above, I reevaluated the patient and found that they have :improved   Dispostion:  Patient was given ostomy supplies and encouraged to reach out to her provider if mailed supplies do not arrive soon.  She is welcome to return for additional supplies as needed.   Medical Decision Making  This note  was produced using Electronics Engineer. While the provider has reviewed and verified all clinical information, transcription errors may remain.    Final diagnoses:  None    ED Discharge Orders     None          Rosina Almarie DELENA DEVONNA 04/06/24 2209    Suzette Pac, MD 04/08/24 1006

## 2024-04-20 ENCOUNTER — Encounter (HOSPITAL_COMMUNITY): Payer: Self-pay | Admitting: Nurse Practitioner

## 2024-04-20 ENCOUNTER — Ambulatory Visit (HOSPITAL_COMMUNITY): Payer: Self-pay | Admitting: Nurse Practitioner

## 2024-04-20 NOTE — Progress Notes (Signed)
 Patient has called clinic again and is requesting supplies.  On 12/11, she was last instructed to call [facility] as her supplies were ready to ship out, they just needed confirmation.  At that time, she confirmed the [facility] address she wanted supplies sent to.  Today, she is asking for supplies for pickup in [place].  There is no WOC nurse on that campus today.  I called [facility] and her supplies were confirmed and delivered to [place] address on 04/16/24.  Today, she is instructed to call [facility] if she needs supplies sent somewhere else and she needs to obtain her supplies from the address she requested they be sent to.

## 2024-04-22 ENCOUNTER — Emergency Department (HOSPITAL_COMMUNITY)
Admission: EM | Admit: 2024-04-22 | Discharge: 2024-04-22 | Disposition: A | Discharge status: Home / Self Care | Attending: Emergency Medicine | Admitting: Emergency Medicine

## 2024-04-22 ENCOUNTER — Emergency Department (HOSPITAL_COMMUNITY)

## 2024-04-22 ENCOUNTER — Other Ambulatory Visit: Payer: Self-pay

## 2024-04-22 DIAGNOSIS — K219 Gastro-esophageal reflux disease without esophagitis: Secondary | ICD-10-CM | POA: Insufficient documentation

## 2024-04-22 DIAGNOSIS — J449 Chronic obstructive pulmonary disease, unspecified: Secondary | ICD-10-CM | POA: Diagnosis not present

## 2024-04-22 DIAGNOSIS — Z79899 Other long term (current) drug therapy: Secondary | ICD-10-CM | POA: Diagnosis not present

## 2024-04-22 DIAGNOSIS — Z91040 Latex allergy status: Secondary | ICD-10-CM | POA: Insufficient documentation

## 2024-04-22 DIAGNOSIS — E1142 Type 2 diabetes mellitus with diabetic polyneuropathy: Secondary | ICD-10-CM | POA: Insufficient documentation

## 2024-04-22 DIAGNOSIS — I10 Essential (primary) hypertension: Secondary | ICD-10-CM | POA: Insufficient documentation

## 2024-04-22 DIAGNOSIS — R109 Unspecified abdominal pain: Secondary | ICD-10-CM | POA: Insufficient documentation

## 2024-04-22 DIAGNOSIS — Z933 Colostomy status: Secondary | ICD-10-CM | POA: Diagnosis present

## 2024-04-22 DIAGNOSIS — Z7951 Long term (current) use of inhaled steroids: Secondary | ICD-10-CM | POA: Insufficient documentation

## 2024-04-22 DIAGNOSIS — Z433 Encounter for attention to colostomy: Secondary | ICD-10-CM

## 2024-04-22 LAB — COMPREHENSIVE METABOLIC PANEL WITH GFR
ALT: 24 U/L (ref 0–44)
AST: 24 U/L (ref 15–41)
Albumin: 4.3 g/dL (ref 3.5–5.0)
Alkaline Phosphatase: 132 U/L — ABNORMAL HIGH (ref 38–126)
Anion gap: 18 — ABNORMAL HIGH (ref 5–15)
BUN: 19 mg/dL (ref 6–20)
CO2: 22 mmol/L (ref 22–32)
Calcium: 9.6 mg/dL (ref 8.9–10.3)
Chloride: 102 mmol/L (ref 98–111)
Creatinine, Ser: 2.05 mg/dL — ABNORMAL HIGH (ref 0.44–1.00)
GFR, Estimated: 28 mL/min — ABNORMAL LOW
Glucose, Bld: 73 mg/dL (ref 70–99)
Potassium: 3.9 mmol/L (ref 3.5–5.1)
Sodium: 142 mmol/L (ref 135–145)
Total Bilirubin: 0.2 mg/dL (ref 0.0–1.2)
Total Protein: 7.4 g/dL (ref 6.5–8.1)

## 2024-04-22 LAB — LIPASE, BLOOD: Lipase: 67 U/L — ABNORMAL HIGH (ref 11–51)

## 2024-04-22 LAB — CBC
HCT: 36.8 % (ref 36.0–46.0)
Hemoglobin: 11.3 g/dL — ABNORMAL LOW (ref 12.0–15.0)
MCH: 26.9 pg (ref 26.0–34.0)
MCHC: 30.7 g/dL (ref 30.0–36.0)
MCV: 87.6 fL (ref 80.0–100.0)
Platelets: 267 K/uL (ref 150–400)
RBC: 4.2 MIL/uL (ref 3.87–5.11)
RDW: 15.9 % — ABNORMAL HIGH (ref 11.5–15.5)
WBC: 9.5 K/uL (ref 4.0–10.5)
nRBC: 0 % (ref 0.0–0.2)

## 2024-04-22 LAB — MAGNESIUM: Magnesium: 1.7 mg/dL (ref 1.7–2.4)

## 2024-04-22 MED ORDER — LACTATED RINGERS IV BOLUS
500.0000 mL | Freq: Once | INTRAVENOUS | Status: AC
  Administered 2024-04-22: 500 mL via INTRAVENOUS

## 2024-04-22 MED ORDER — FENTANYL CITRATE (PF) 100 MCG/2ML IJ SOLN
50.0000 ug | Freq: Once | INTRAMUSCULAR | Status: AC
  Administered 2024-04-22: 50 ug via INTRAVENOUS
  Filled 2024-04-22: qty 2

## 2024-04-22 NOTE — ED Notes (Signed)
 Patient transported to CT

## 2024-04-22 NOTE — ED Triage Notes (Signed)
 Pt arrived POV requesting colostomy bag to be replaced.

## 2024-04-22 NOTE — ED Provider Notes (Signed)
 " [name] Provider Note   CSN: [id number] Arrival date & time: 04/22/24  9770     Patient presents with: colon bag   [name] is a 53 y.o. female.   HPI Patient presents for abdominal pain and colostomy bag leakage.  Medical history includes GERD, IBS, COPD, Crohn's disease, DM, anxiety, HTN, neuropathy, ileostomy.  Ileostomy was performed last year.  She has had multiple ED visits recently for colostomy supplies.  She states that she woke up this evening with pain in area of ostomy and leakage from ostomy bag.  She denies any other symptoms or concerns.    Prior to Admission medications  Medication Sig Start Date End Date Taking? Authorizing Provider  albuterol  (ACCUNEB ) 1.25 MG/3ML nebulizer solution Take 1 ampule by nebulization every 6 (six) hours as needed for wheezing or shortness of breath. 04/21/23   [provider]  ALPRAZolam  (XANAX ) 1 MG tablet Take 0.5 tablets (0.5 mg total) by mouth 2 (two) times daily as needed for anxiety. Patient taking differently: Take 1 mg by mouth 3 (three) times daily. 06/28/23   Elgergawy, Brayton RAMAN, MD  amitriptyline  (ELAVIL ) 100 MG tablet Take 100 mg by mouth at bedtime.    [provider]  cefdinir  (OMNICEF ) 300 MG capsule Take 1 capsule (300 mg total) by mouth 2 (two) times daily. 02/25/24   Raford Lenis, MD  cetirizine (ZYRTEC) 10 MG tablet Take 10 mg by mouth daily. 01/08/22   [provider]  cyanocobalamin  (VITAMIN B12) 1000 MCG/ML injection Inject 1 mL (1,000 mcg total) into the skin daily for 4 days, THEN 1 mL (1,000 mcg total) every 30 (thirty) days. 01/01/24 07/03/24  Maree, Pratik D, DO  cyclobenzaprine  (FLEXERIL ) 10 MG tablet Take 10 mg by mouth 3 (three) times daily.    [provider]  feeding supplement (ENSURE ENLIVE / ENSURE PLUS) LIQD Take 237 mLs by mouth 2 (two) times daily between meals. 01/04/22   Ricky Fines, MD  gabapentin  (NEURONTIN ) 300  MG capsule Take 300 mg by mouth daily as needed (for pain). 10/15/22   [provider]  levETIRAcetam  (KEPPRA ) 750 MG tablet Take 750 mg by mouth 2 (two) times daily. 09/30/20   [provider]  loperamide  HCl (IMODIUM ) 1 MG/7.5ML suspension Take 7.5 mLs (1 mg total) by mouth as needed for diarrhea or loose stools. Max daily dose is 16 mg per day, do not exceed more than 16 mg per day. 02/27/24   Charmayne Holmes, DO  Magnesium  Oxide -Mg Supplement (MAG-OXIDE) 200 MG TABS Take 1 tablet (200 mg total) by mouth daily. You may stop taking this after your diarrhea has stopped or until your PCP instructs you to stop the medicine 02/27/24   Kandis Perkins, DO  [Paused] metoprolol  tartrate (LOPRESSOR ) 25 MG tablet Take 25 mg by mouth. Wait to take this until your doctor or other care provider tells you to start again. 02/01/24   [provider]  nitroGLYCERIN  (NITROSTAT ) 0.4 MG SL tablet Place 1 tablet (0.4 mg total) under the tongue every 5 (five) minutes as needed for chest pain. 04/25/23   Johnson, Clanford L, MD  ondansetron  (ZOFRAN -ODT) 4 MG disintegrating tablet Take 1 tablet (4 mg total) by mouth every 8 (eight) hours as needed for nausea or vomiting. 02/25/24   Raford Lenis, MD  pantoprazole  (PROTONIX ) 40 MG tablet Take 1 tablet (40 mg total) by mouth 2 (two) times daily. 02/28/24   Eartha Angelia Sieving,  MD  psyllium (HYDROCIL/METAMUCIL) 95 % PACK Take 1 packet by mouth 3 (three) times daily. 02/27/24   Charmayne Holmes, DO  scopolamine  (TRANSDERM-SCOP) 1 MG/3DAYS Place 1 patch onto the skin every 3 (three) days. As needed    [provider]  SPIRIVA  RESPIMAT 2.5 MCG/ACT AERS Inhale 2 puffs into the lungs daily.    [provider]  traZODone  (DESYREL ) 50 MG tablet Take 50 mg by mouth at bedtime.    [provider]  triazolam  (HALCION ) 0.25 MG tablet Take 0.25 mg by mouth at bedtime. 10/08/23   [provider]  VENTOLIN  HFA 108 (90 Base) MCG/ACT  inhaler Inhale 1 puff into the lungs every 6 (six) hours as needed for wheezing or shortness of breath.    [provider]    Allergies: Codeine, Hydrocodone , Ketorolac , Morphine and codeine, Penicillins, Ativan  [lorazepam ], Oxycodone -acetaminophen , Pregabalin , Strawberry extract, Watermelon concentrate, Albuterol , Atarax  [hydroxyzine ], Benadryl  [diphenhydramine  hcl], Humira  [adalimumab ], Latex, Nickel, Remicade  [infliximab ], and Tramadol     Review of Systems  Gastrointestinal:  Positive for abdominal pain.  All other systems reviewed and are negative.   Updated Vital Signs BP (!) 93/42   Pulse 62   Temp 97.9 F (36.6 C) (Oral)   Resp 16   Ht 5' 6 (1.676 m)   Wt 63.5 kg   SpO2 100%   BMI 22.60 kg/m   Physical Exam Vitals and nursing note reviewed.  Constitutional:      General: She is not in acute distress.    Appearance: Normal appearance. She is well-developed. She is not ill-appearing, toxic-appearing or diaphoretic.  HENT:     Head: Normocephalic and atraumatic.     Right Ear: External ear normal.     Left Ear: External ear normal.     Nose: Nose normal.     Mouth/Throat:     Mouth: Mucous membranes are moist.  Eyes:     Extraocular Movements: Extraocular movements intact.     Conjunctiva/sclera: Conjunctivae normal.  Cardiovascular:     Rate and Rhythm: Normal rate and regular rhythm.  Pulmonary:     Effort: Pulmonary effort is normal. No respiratory distress.  Abdominal:     General: There is no distension.     Palpations: Abdomen is soft.     Tenderness: There is abdominal tenderness. There is no guarding or rebound.  Musculoskeletal:        General: No swelling. Normal range of motion.     Cervical back: Normal range of motion and neck supple.  Skin:    General: Skin is warm and dry.     Coloration: Skin is not jaundiced or pale.  Neurological:     General: No focal deficit present.     Mental Status: She is alert and oriented to person, place,  and time.  Psychiatric:        Mood and Affect: Mood normal.        Behavior: Behavior normal.     (all labs ordered are listed, but only abnormal results are displayed) Labs Reviewed  COMPREHENSIVE METABOLIC PANEL WITH GFR - Abnormal; Notable for the following components:      Result Value   Creatinine, Ser 2.05 (*)    Alkaline Phosphatase 132 (*)    GFR, Estimated 28 (*)    Anion gap 18 (*)    All other components within normal limits  LIPASE, BLOOD - Abnormal; Notable for the following components:   Lipase 67 (*)    All other components within  normal limits  CBC - Abnormal; Notable for the following components:   Hemoglobin 11.3 (*)    RDW 15.9 (*)    All other components within normal limits  MAGNESIUM   CBC WITH DIFFERENTIAL/PLATELET    EKG: None  Radiology: CT ABDOMEN PELVIS WO CONTRAST Result Date: 04/22/2024 EXAM: CT ABDOMEN AND PELVIS WITHOUT CONTRAST 04/22/2024 05:42:20 AM TECHNIQUE: CT of the abdomen and pelvis was performed without the administration of intravenous contrast. Multiplanar reformatted images are provided for review. Automated exposure control, iterative reconstruction, and/or weight-based adjustment of the mA/kV was utilized to reduce the radiation dose to as low as reasonably achievable. COMPARISON: 02/25/2024 CLINICAL HISTORY: Abdominal pain, acute, nonlocalized. FINDINGS: LOWER CHEST: Unchanged scar-like density within the anterior right lower lobe. LIVER: The liver is unremarkable. GALLBLADDER AND BILE DUCTS: Status post cholecystectomy. No biliary ductal dilatation. SPLEEN: No acute abnormality. PANCREAS: No acute abnormality. ADRENAL GLANDS: No acute abnormality. KIDNEYS, URETERS AND BLADDER: No stones in the kidneys or ureters. No hydronephrosis. No perinephric or periureteral stranding. Urinary bladder is unremarkable. GI AND BOWEL: Gastric lumen filled with food debris. 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 No pathologic dilatation of the small bowel loops. Within the pelvis, there has been a notable increase in presacral soft tissue edema and perirectal soft tissue stranding compared with 02/25/2024. PERITONEUM AND RETROPERITONEUM: Thickening along the left peritoneal reflection with areas of nodularity identified, similar to the previous exam. An index nodule within the left hemiabdomen measures 8 mm, image 27/2. Previously this measured the same. Adjacent peritoneal nodule along the peritoneal reflection measures 4 mm, image 33/2. Also unchanged. No ascites. No free air. VASCULATURE: Aorta is normal in caliber. LYMPH NODES: No enlarged lymph nodes within the abdomen and pelvis. Increased multiplicity of prominent subcentimeter mesenteric lymph nodes within the lower abdomen and pelvis noted, possibly reactive in etiology. REPRODUCTIVE ORGANS: No acute abnormality. BONES AND SOFT TISSUES: No acute osseous abnormality. No focal soft tissue abnormality. IMPRESSION: 1. Notable increase in presacral soft tissue edema and perirectal soft tissue stranding compared with 02/25/2024. Correlate for any clinical signs or symptoms of proctitis/pouchitis. 2. Increased multiplicity of prominent subcentimeter mesenteric lymph nodes within the lower abdomen and pelvis, possibly reactive in etiology. 3. Thickening of the peritoneal reflection along the left hemiabdomen with areas of nodularity noted. In the absence of a known malignancy. This was favored to represent postinflammatory change in a patient with a history of crohn's disease. Electronically signed by: Waddell Calk MD 04/22/2024 06:02 AM EST RP Workstation: [id number]     Procedures   Medications Ordered in the ED  lactated ringers  bolus 500 mL (has no administration in time range)  fentaNYL  (SUBLIMAZE ) injection 50 mcg (50 mcg Intravenous Given 04/22/24 0417)  lactated ringers  bolus 500 mL (0 mLs Intravenous Stopped 04/22/24 9374)                                     Medical Decision Making Amount and/or Complexity of Data Reviewed Labs: ordered. Radiology: ordered.  Risk Prescription drug management.   Patient presenting for abdominal pain and leakage from colostomy bag.  She describes waking up with pain in area of ostomy.  She denies any other areas of abdominal pain.  On arrival in the ED, she is well-appearing on exam.  Her clothes are soiled due to the colostomy bag leakage.  She has localized tenderness to area  of ostomy.  Skin and mucosa are normal in appearance.  Fentanyl  was ordered for analgesia.  Workup was initiated.  Lab work shows no leukocytosis, baseline creatinine, normal electrolytes.  CT scan shows some areas of inflammation but none in area of patient's described pain.  On reassessment, patient is resting comfortably.  Her blood pressures in the emergency department were in the range of 90s over 50s.  Patient reports that this is her baseline.  She was discharged in stable condition.     Final diagnoses:  Colostomy care [facility])    ED Discharge Orders     None          Melvenia Motto, MD 04/22/24 [phone]  "

## 2024-04-22 NOTE — Discharge Instructions (Addendum)
 Your test results were reassuring.  Return to the emergency department for any new or worsening symptoms of concern.

## 2024-04-23 ENCOUNTER — Telehealth (INDEPENDENT_AMBULATORY_CARE_PROVIDER_SITE_OTHER): Payer: Self-pay | Admitting: Gastroenterology

## 2024-04-23 NOTE — Telephone Encounter (Signed)
 Pt called in and wanted to know if we could call in ostomy bags for her. Pt states she has none left. Pt states she is in pain and having to use towel and wash cloths. Pt states she called [facility] and they told her to call her GI to have them send in a script. Spoke with Mitzie Boettcher and she states that we do not send in colostomy/ostomy supplies, that would have to go through careers adviser. Pt states she was not sure who that would be. Advised pt that back in October she seen [facility] and she would need to call them. Pt states she has called them. Also stated that last time they gave her supplies they were not right. Pt states she was told that [facility] would have them. I looked up [facility] and [facility] to see if the store had them or how soon they could be shipped. [facility] does not carry them in store and earliest shipping date would be 04/25/24. Advised pt to get in touch with [facility]. Pt stated there is no doctor there that can write a script and I stated even though we are GI we do not deal with ostomy/colostomy, that would have to be through surgeon.
